# Patient Record
Sex: Male | Born: 1945 | Race: Black or African American | Hispanic: No | Smoking: Former smoker
Health system: Southern US, Community
[De-identification: ages and names within clinical notes are randomized; demographics above are authoritative.]

## PROBLEM LIST (undated history)

## (undated) DIAGNOSIS — R42 Dizziness and giddiness: Secondary | ICD-10-CM

## (undated) DIAGNOSIS — I509 Heart failure, unspecified: Secondary | ICD-10-CM

## (undated) DIAGNOSIS — K5090 Crohn's disease, unspecified, without complications: Secondary | ICD-10-CM

## (undated) DIAGNOSIS — N400 Enlarged prostate without lower urinary tract symptoms: Secondary | ICD-10-CM

## (undated) DIAGNOSIS — F32A Depression, unspecified: Secondary | ICD-10-CM

## (undated) DIAGNOSIS — H8110 Benign paroxysmal vertigo, unspecified ear: Secondary | ICD-10-CM

## (undated) DIAGNOSIS — G4700 Insomnia, unspecified: Secondary | ICD-10-CM

## (undated) DIAGNOSIS — E876 Hypokalemia: Secondary | ICD-10-CM

## (undated) DIAGNOSIS — F17200 Nicotine dependence, unspecified, uncomplicated: Secondary | ICD-10-CM

## (undated) DIAGNOSIS — F329 Major depressive disorder, single episode, unspecified: Secondary | ICD-10-CM

## (undated) DIAGNOSIS — J8410 Pulmonary fibrosis, unspecified: Secondary | ICD-10-CM

## (undated) DIAGNOSIS — L0390 Cellulitis, unspecified: Secondary | ICD-10-CM

## (undated) DIAGNOSIS — G250 Essential tremor: Secondary | ICD-10-CM

## (undated) DIAGNOSIS — E538 Deficiency of other specified B group vitamins: Secondary | ICD-10-CM

## (undated) DIAGNOSIS — N183 Chronic kidney disease, stage 3 unspecified: Secondary | ICD-10-CM

## (undated) DIAGNOSIS — G252 Other specified forms of tremor: Secondary | ICD-10-CM

## (undated) DIAGNOSIS — K900 Celiac disease: Secondary | ICD-10-CM

## (undated) DIAGNOSIS — G47419 Narcolepsy without cataplexy: Secondary | ICD-10-CM

## (undated) DIAGNOSIS — D649 Anemia, unspecified: Secondary | ICD-10-CM

## (undated) DIAGNOSIS — G2589 Other specified extrapyramidal and movement disorders: Secondary | ICD-10-CM

## (undated) DIAGNOSIS — IMO0002 Reserved for concepts with insufficient information to code with codable children: Secondary | ICD-10-CM

## (undated) DIAGNOSIS — G4730 Sleep apnea, unspecified: Secondary | ICD-10-CM

## (undated) DIAGNOSIS — L0291 Cutaneous abscess, unspecified: Secondary | ICD-10-CM

## (undated) DIAGNOSIS — F419 Anxiety disorder, unspecified: Secondary | ICD-10-CM

## (undated) DIAGNOSIS — N529 Male erectile dysfunction, unspecified: Secondary | ICD-10-CM

## (undated) DIAGNOSIS — I10 Essential (primary) hypertension: Secondary | ICD-10-CM

## (undated) DIAGNOSIS — M109 Gout, unspecified: Secondary | ICD-10-CM

## (undated) DIAGNOSIS — F319 Bipolar disorder, unspecified: Secondary | ICD-10-CM

## (undated) DIAGNOSIS — B079 Viral wart, unspecified: Secondary | ICD-10-CM

## (undated) DIAGNOSIS — R634 Abnormal weight loss: Secondary | ICD-10-CM

## (undated) DIAGNOSIS — D509 Iron deficiency anemia, unspecified: Secondary | ICD-10-CM

## (undated) DIAGNOSIS — E441 Mild protein-calorie malnutrition: Secondary | ICD-10-CM

## (undated) HISTORY — DX: Essential tremor: G25.0

## (undated) HISTORY — DX: Mild protein-calorie malnutrition: E44.1

## (undated) HISTORY — DX: Chronic kidney disease, stage 3 (moderate): N18.3

## (undated) HISTORY — DX: Insomnia, unspecified: G47.00

## (undated) HISTORY — DX: Bipolar disorder, unspecified: F31.9

## (undated) HISTORY — DX: Deficiency of other specified B group vitamins: E53.8

## (undated) HISTORY — DX: Hypokalemia: E87.6

## (undated) HISTORY — DX: Heart failure, unspecified: I50.9

## (undated) HISTORY — DX: Cellulitis, unspecified: L03.90

## (undated) HISTORY — DX: Anemia, unspecified: D64.9

## (undated) HISTORY — DX: Abnormal weight loss: R63.4

## (undated) HISTORY — DX: Iron deficiency anemia, unspecified: D50.9

## (undated) HISTORY — DX: Depression, unspecified: F32.A

## (undated) HISTORY — DX: Benign paroxysmal vertigo, unspecified ear: H81.10

## (undated) HISTORY — PX: SMALL INTESTINE SURGERY: SHX150

## (undated) HISTORY — DX: Chronic kidney disease, stage 3 unspecified: N18.30

## (undated) HISTORY — DX: Essential (primary) hypertension: I10

## (undated) HISTORY — DX: Nicotine dependence, unspecified, uncomplicated: F17.200

## (undated) HISTORY — DX: Dizziness and giddiness: R42

## (undated) HISTORY — DX: Cutaneous abscess, unspecified: L02.91

## (undated) HISTORY — DX: Reserved for concepts with insufficient information to code with codable children: IMO0002

## (undated) HISTORY — DX: Pulmonary fibrosis, unspecified: J84.10

## (undated) HISTORY — DX: Narcolepsy without cataplexy: G47.419

## (undated) HISTORY — DX: Viral wart, unspecified: B07.9

## (undated) HISTORY — DX: Anxiety disorder, unspecified: F41.9

## (undated) HISTORY — DX: Crohn's disease, unspecified, without complications: K50.90

## (undated) HISTORY — DX: Major depressive disorder, single episode, unspecified: F32.9

## (undated) HISTORY — DX: Sleep apnea, unspecified: G47.30

## (undated) HISTORY — DX: Celiac disease: K90.0

## (undated) HISTORY — DX: Other specified extrapyramidal and movement disorders: G25.89

## (undated) HISTORY — DX: Benign prostatic hyperplasia without lower urinary tract symptoms: N40.0

## (undated) HISTORY — DX: Essential tremor: G25.2

## (undated) HISTORY — DX: Male erectile dysfunction, unspecified: N52.9

---

## 1999-10-02 ENCOUNTER — Ambulatory Visit (HOSPITAL_COMMUNITY): Admission: RE | Admit: 1999-10-02 | Discharge: 1999-10-02 | Payer: Self-pay | Admitting: *Deleted

## 2001-08-21 ENCOUNTER — Emergency Department (HOSPITAL_COMMUNITY): Admission: EM | Admit: 2001-08-21 | Discharge: 2001-08-21 | Payer: Self-pay

## 2002-02-09 ENCOUNTER — Ambulatory Visit (HOSPITAL_COMMUNITY): Admission: RE | Admit: 2002-02-09 | Discharge: 2002-02-09 | Payer: Self-pay | Admitting: *Deleted

## 2002-02-09 ENCOUNTER — Encounter: Payer: Self-pay | Admitting: Gastroenterology

## 2004-04-05 ENCOUNTER — Ambulatory Visit (HOSPITAL_COMMUNITY): Admission: RE | Admit: 2004-04-05 | Discharge: 2004-04-05 | Payer: Self-pay | Admitting: *Deleted

## 2004-04-05 ENCOUNTER — Encounter: Payer: Self-pay | Admitting: Gastroenterology

## 2004-07-21 ENCOUNTER — Emergency Department (HOSPITAL_COMMUNITY): Admission: EM | Admit: 2004-07-21 | Discharge: 2004-07-22 | Payer: Self-pay | Admitting: Emergency Medicine

## 2005-02-21 ENCOUNTER — Encounter: Admission: RE | Admit: 2005-02-21 | Discharge: 2005-02-21 | Payer: Self-pay | Admitting: Internal Medicine

## 2005-04-17 ENCOUNTER — Encounter: Admission: RE | Admit: 2005-04-17 | Discharge: 2005-04-17 | Payer: Self-pay | Admitting: Internal Medicine

## 2005-10-15 ENCOUNTER — Ambulatory Visit (HOSPITAL_BASED_OUTPATIENT_CLINIC_OR_DEPARTMENT_OTHER): Admission: RE | Admit: 2005-10-15 | Discharge: 2005-10-15 | Payer: Self-pay | Admitting: Orthopedic Surgery

## 2006-08-17 ENCOUNTER — Encounter: Payer: Self-pay | Admitting: Gastroenterology

## 2006-08-31 ENCOUNTER — Ambulatory Visit (HOSPITAL_COMMUNITY): Admission: RE | Admit: 2006-08-31 | Discharge: 2006-08-31 | Payer: Self-pay | Admitting: *Deleted

## 2006-08-31 ENCOUNTER — Encounter (INDEPENDENT_AMBULATORY_CARE_PROVIDER_SITE_OTHER): Payer: Self-pay | Admitting: *Deleted

## 2006-12-28 ENCOUNTER — Encounter: Payer: Self-pay | Admitting: Gastroenterology

## 2008-05-05 ENCOUNTER — Encounter: Admission: RE | Admit: 2008-05-05 | Discharge: 2008-05-05 | Payer: Self-pay | Admitting: Internal Medicine

## 2009-11-26 ENCOUNTER — Ambulatory Visit: Payer: Self-pay | Admitting: Oncology

## 2009-11-30 ENCOUNTER — Encounter (INDEPENDENT_AMBULATORY_CARE_PROVIDER_SITE_OTHER): Payer: Self-pay | Admitting: *Deleted

## 2009-11-30 LAB — MORPHOLOGY: PLT EST: ADEQUATE

## 2009-11-30 LAB — CBC & DIFF AND RETIC
BASO%: 0.2 % (ref 0.0–2.0)
Basophils Absolute: 0 10*3/uL (ref 0.0–0.1)
EOS%: 3.6 % (ref 0.0–7.0)
Eosinophils Absolute: 0.3 10*3/uL (ref 0.0–0.5)
HCT: 32.3 % — ABNORMAL LOW (ref 38.4–49.9)
HGB: 10.3 g/dL — ABNORMAL LOW (ref 13.0–17.1)
Immature Retic Fract: 3.9 % (ref 0.00–13.40)
LYMPH%: 29.7 % (ref 14.0–49.0)
MCH: 31.4 pg (ref 27.2–33.4)
MCHC: 31.9 g/dL — ABNORMAL LOW (ref 32.0–36.0)
MCV: 98.5 fL — ABNORMAL HIGH (ref 79.3–98.0)
MONO#: 0.5 10*3/uL (ref 0.1–0.9)
MONO%: 5.4 % (ref 0.0–14.0)
NEUT#: 5.2 10*3/uL (ref 1.5–6.5)
NEUT%: 61.1 % (ref 39.0–75.0)
Platelets: 188 10*3/uL (ref 140–400)
RBC: 3.28 10*6/uL — ABNORMAL LOW (ref 4.20–5.82)
RDW: 12.5 % (ref 11.0–14.6)
Retic %: 0.83 % (ref 0.50–1.60)
Retic Ct Abs: 27.22 10*3/uL (ref 24.10–77.50)
WBC: 8.5 10*3/uL (ref 4.0–10.3)
lymph#: 2.5 10*3/uL (ref 0.9–3.3)

## 2009-11-30 LAB — COMPREHENSIVE METABOLIC PANEL
ALT: 8 U/L (ref 0–53)
AST: 15 U/L (ref 0–37)
Albumin: 3.2 g/dL — ABNORMAL LOW (ref 3.5–5.2)
Alkaline Phosphatase: 52 U/L (ref 39–117)
BUN: 10 mg/dL (ref 6–23)
CO2: 24 mEq/L (ref 19–32)
Calcium: 8.6 mg/dL (ref 8.4–10.5)
Chloride: 112 mEq/L (ref 96–112)
Creatinine, Ser: 2.07 mg/dL — ABNORMAL HIGH (ref 0.40–1.50)
Glucose, Bld: 84 mg/dL (ref 70–99)
Potassium: 3.8 mEq/L (ref 3.5–5.3)
Sodium: 142 mEq/L (ref 135–145)
Total Bilirubin: 0.8 mg/dL (ref 0.3–1.2)
Total Protein: 6.3 g/dL (ref 6.0–8.3)

## 2009-11-30 LAB — CHCC SMEAR

## 2009-11-30 LAB — LACTATE DEHYDROGENASE: LDH: 95 U/L (ref 94–250)

## 2009-12-04 LAB — IRON AND TIBC
%SAT: 22 % (ref 20–55)
Iron: 38 ug/dL — ABNORMAL LOW (ref 42–165)
TIBC: 175 ug/dL — ABNORMAL LOW (ref 215–435)
UIBC: 137 ug/dL

## 2009-12-04 LAB — FERRITIN: Ferritin: 554 ng/mL — ABNORMAL HIGH (ref 22–322)

## 2009-12-06 LAB — PROTEIN ELECTROPHORESIS, SERUM
Albumin ELP: 56.7 % (ref 55.8–66.1)
Alpha-1-Globulin: 5.4 % — ABNORMAL HIGH (ref 2.9–4.9)
Alpha-2-Globulin: 8.9 % (ref 7.1–11.8)
Beta 2: 5.7 % (ref 3.2–6.5)
Beta Globulin: 5.1 % (ref 4.7–7.2)
Gamma Globulin: 18.2 % (ref 11.1–18.8)
Total Protein, Serum Electrophoresis: 6.3 g/dL (ref 6.0–8.3)

## 2009-12-06 LAB — KAPPA/LAMBDA LIGHT CHAINS
Kappa free light chain: 2.32 mg/dL — ABNORMAL HIGH (ref 0.33–1.94)
Kappa:Lambda Ratio: 1.25 (ref 0.26–1.65)
Lambda Free Lght Chn: 1.86 mg/dL (ref 0.57–2.63)

## 2009-12-06 LAB — HEMOGLOBINOPATHY EVALUATION
Hemoglobin Other: 0 % (ref 0.0–0.0)
Hgb A2 Quant: 2.2 % (ref 2.2–3.2)
Hgb A: 97.8 % (ref 96.8–97.8)
Hgb F Quant: 0 % (ref 0.0–2.0)
Hgb S Quant: 0 % (ref 0.0–0.0)

## 2009-12-06 LAB — ERYTHROPOIETIN: Erythropoietin: 6.5 m[IU]/mL (ref 2.6–34.0)

## 2009-12-27 ENCOUNTER — Ambulatory Visit: Payer: Self-pay | Admitting: Oncology

## 2009-12-31 LAB — CBC WITH DIFFERENTIAL/PLATELET
BASO%: 0.4 % (ref 0.0–2.0)
Eosinophils Absolute: 0.5 10*3/uL (ref 0.0–0.5)
LYMPH%: 30.5 % (ref 14.0–49.0)
MONO#: 0.6 10*3/uL (ref 0.1–0.9)
NEUT#: 5.3 10*3/uL (ref 1.5–6.5)
Platelets: 176 10*3/uL (ref 140–400)
RBC: 2.81 10*6/uL — ABNORMAL LOW (ref 4.20–5.82)
WBC: 9.1 10*3/uL (ref 4.0–10.3)
lymph#: 2.8 10*3/uL (ref 0.9–3.3)

## 2010-01-09 ENCOUNTER — Telehealth (INDEPENDENT_AMBULATORY_CARE_PROVIDER_SITE_OTHER): Payer: Self-pay | Admitting: *Deleted

## 2010-01-10 ENCOUNTER — Emergency Department (HOSPITAL_COMMUNITY)
Admission: EM | Admit: 2010-01-10 | Discharge: 2010-01-11 | Payer: Self-pay | Source: Home / Self Care | Admitting: Interventional Radiology

## 2010-01-14 ENCOUNTER — Encounter (HOSPITAL_COMMUNITY)
Admission: RE | Admit: 2010-01-14 | Discharge: 2010-03-26 | Payer: Self-pay | Source: Home / Self Care | Attending: Nephrology | Admitting: Nephrology

## 2010-01-23 ENCOUNTER — Encounter (INDEPENDENT_AMBULATORY_CARE_PROVIDER_SITE_OTHER): Payer: Self-pay | Admitting: *Deleted

## 2010-01-23 ENCOUNTER — Ambulatory Visit: Payer: Self-pay | Admitting: Gastroenterology

## 2010-01-23 DIAGNOSIS — K5090 Crohn's disease, unspecified, without complications: Secondary | ICD-10-CM

## 2010-01-23 LAB — CONVERTED CEMR LAB
ALT: 26 units/L (ref 0–53)
AST: 25 units/L (ref 0–37)
Albumin: 3.4 g/dL — ABNORMAL LOW (ref 3.5–5.2)
Alkaline Phosphatase: 66 units/L (ref 39–117)
BUN: 15 mg/dL (ref 6–23)
Basophils Absolute: 0 10*3/uL (ref 0.0–0.1)
Basophils Relative: 0.6 % (ref 0.0–3.0)
CO2: 26 meq/L (ref 19–32)
Calcium: 8.9 mg/dL (ref 8.4–10.5)
Chloride: 111 meq/L (ref 96–112)
Creatinine, Ser: 2.1 mg/dL — ABNORMAL HIGH (ref 0.4–1.5)
Eosinophils Absolute: 0.3 10*3/uL (ref 0.0–0.7)
Eosinophils Relative: 4.9 % (ref 0.0–5.0)
GFR calc non Af Amer: 40.11 mL/min (ref >60)
Glucose, Bld: 77 mg/dL (ref 70–99)
HCT: 32 % — ABNORMAL LOW (ref 39.0–52.0)
Hemoglobin: 10.9 g/dL — ABNORMAL LOW (ref 13.0–17.0)
Lymphocytes Relative: 26.5 % (ref 12.0–46.0)
Lymphs Abs: 1.7 10*3/uL (ref 0.7–4.0)
MCHC: 34.1 g/dL (ref 30.0–36.0)
MCV: 100.2 fL — ABNORMAL HIGH (ref 78.0–100.0)
Monocytes Absolute: 0.5 10*3/uL (ref 0.1–1.0)
Monocytes Relative: 7.7 % (ref 3.0–12.0)
Neutro Abs: 3.8 10*3/uL (ref 1.4–7.7)
Neutrophils Relative %: 60.3 % (ref 43.0–77.0)
Platelets: 222 10*3/uL (ref 150.0–400.0)
Potassium: 4.1 meq/L (ref 3.5–5.1)
RBC: 3.2 M/uL — ABNORMAL LOW (ref 4.22–5.81)
RDW: 14.1 % (ref 11.5–14.6)
Sed Rate: 23 mm/hr — ABNORMAL HIGH (ref 0–22)
Sodium: 143 meq/L (ref 135–145)
Total Bilirubin: 0.2 mg/dL — ABNORMAL LOW (ref 0.3–1.2)
Total Protein: 6.4 g/dL (ref 6.0–8.3)
WBC: 6.4 10*3/uL (ref 4.5–10.5)

## 2010-01-28 ENCOUNTER — Ambulatory Visit: Payer: Self-pay | Admitting: Oncology

## 2010-01-28 ENCOUNTER — Ambulatory Visit: Payer: Self-pay | Admitting: Gastroenterology

## 2010-01-28 LAB — CBC WITH DIFFERENTIAL/PLATELET
BASO%: 0.7 % (ref 0.0–2.0)
Eosinophils Absolute: 0.3 10*3/uL (ref 0.0–0.5)
MCHC: 33.1 g/dL (ref 32.0–36.0)
MCV: 100.7 fL — ABNORMAL HIGH (ref 79.3–98.0)
MONO#: 0.6 10*3/uL (ref 0.1–0.9)
MONO%: 7.5 % (ref 0.0–14.0)
NEUT#: 5 10*3/uL (ref 1.5–6.5)
RBC: 3.32 10*6/uL — ABNORMAL LOW (ref 4.20–5.82)
RDW: 14.2 % (ref 11.0–14.6)
WBC: 7.7 10*3/uL (ref 4.0–10.3)

## 2010-01-28 LAB — HM COLONOSCOPY: HM COLON: NEGATIVE

## 2010-01-30 ENCOUNTER — Encounter: Payer: Self-pay | Admitting: Gastroenterology

## 2010-01-30 ENCOUNTER — Ambulatory Visit: Payer: Self-pay | Admitting: Gastroenterology

## 2010-02-15 ENCOUNTER — Ambulatory Visit: Payer: Self-pay | Admitting: Gastroenterology

## 2010-02-15 ENCOUNTER — Encounter
Admission: RE | Admit: 2010-02-15 | Discharge: 2010-02-15 | Payer: Self-pay | Source: Home / Self Care | Attending: Internal Medicine | Admitting: Internal Medicine

## 2010-02-28 ENCOUNTER — Ambulatory Visit (HOSPITAL_BASED_OUTPATIENT_CLINIC_OR_DEPARTMENT_OTHER): Payer: Medicare Other | Admitting: Oncology

## 2010-03-04 LAB — CBC WITH DIFFERENTIAL/PLATELET
BASO%: 0.3 % (ref 0.0–2.0)
Basophils Absolute: 0 10*3/uL (ref 0.0–0.1)
EOS%: 2.3 % (ref 0.0–7.0)
Eosinophils Absolute: 0.2 10*3/uL (ref 0.0–0.5)
HCT: 36.7 % — ABNORMAL LOW (ref 38.4–49.9)
HGB: 12.1 g/dL — ABNORMAL LOW (ref 13.0–17.1)
LYMPH%: 26.1 % (ref 14.0–49.0)
MCH: 33.6 pg — ABNORMAL HIGH (ref 27.2–33.4)
MCHC: 33.1 g/dL (ref 32.0–36.0)
MCV: 101.4 fL — ABNORMAL HIGH (ref 79.3–98.0)
MONO#: 0.8 10*3/uL (ref 0.1–0.9)
MONO%: 9.7 % (ref 0.0–14.0)
NEUT#: 5.2 10*3/uL (ref 1.5–6.5)
NEUT%: 61.6 % (ref 39.0–75.0)
Platelets: 245 10*3/uL (ref 140–400)
RBC: 3.61 10*6/uL — ABNORMAL LOW (ref 4.20–5.82)
RDW: 14.3 % (ref 11.0–14.6)
WBC: 8.5 10*3/uL (ref 4.0–10.3)
lymph#: 2.2 10*3/uL (ref 0.9–3.3)

## 2010-03-11 LAB — POCT HEMOGLOBIN-HEMACUE: Hemoglobin: 12.1 g/dL — ABNORMAL LOW (ref 13.0–17.0)

## 2010-03-14 ENCOUNTER — Encounter
Admission: RE | Admit: 2010-03-14 | Discharge: 2010-03-14 | Payer: Self-pay | Source: Home / Self Care | Attending: Internal Medicine | Admitting: Internal Medicine

## 2010-03-18 LAB — POCT HEMOGLOBIN-HEMACUE: Hemoglobin: 11.8 g/dL — ABNORMAL LOW (ref 13.0–17.0)

## 2010-03-22 LAB — POCT HEMOGLOBIN-HEMACUE: Hemoglobin: 11.5 g/dL — ABNORMAL LOW (ref 13.0–17.0)

## 2010-03-26 NOTE — Progress Notes (Signed)
Summary: appt reschedule  Phone Note Other Incoming   Summary of Call: pt called to say he wanted to reschedule his appt he is feeling better and wants to wait until he gets his records from Dr Lajoyce Corners.  It is going to take 2 weeks to get them from Dr Shan Levans storage.  He said the appt was just to establish care and he would call if problems develop.  Appt was reschdueld Initial call taken by: Christian Mate CMA Deborra Medina),  January 09, 2010 1:07 PM

## 2010-03-26 NOTE — Miscellaneous (Signed)
Summary: pred rx  Clinical Lists Changes  Medications: Added new medication of PREDNISONE 10 MG  TABS (PREDNISONE) take one pill twice a day - Signed Rx of PREDNISONE 10 MG  TABS (PREDNISONE) take one pill twice a day;  #60 x 2;  Signed;  Entered by: Milus Banister MD;  Authorized by: Milus Banister MD;  Method used: Electronically to [facility] [phone]*, [place], [place], [place]  [address], Ph: [phone] or [phone], Fax: [phone]    Prescriptions: PREDNISONE 10 MG  TABS (PREDNISONE) take one pill twice a day  #60 x 2   Entered and Authorized by:   Milus Banister MD   Signed by:   Milus Banister MD on 01/28/2010   Method used:   Electronically to        [facility] [phone]* (retail)       [address] [place], [place]  [address]       Ph: [phone] or [phone]       Fax: [phone]   RxID:   [id number]

## 2010-03-26 NOTE — Letter (Signed)
Summary: [facility] Instructions  [facility]  [place], [place] [address]   Phone: [phone]  Fax: [phone]       DANTE ROUDEBUSH    1945-09-26    MRN: [id number]        Procedure Day /Date:01/28/10 MON     Arrival Time:3 PM     Procedure Time:4 PM     Location of Procedure:                    x  [place] (4th Floor)                        [place]   Starting 5 days prior to your procedure11/30/11 do not eat nuts, seeds, popcorn, corn, beans, peas,  salads, or any raw vegetables.  Do not take any fiber supplements (e.g. Metamucil, Citrucel, and Benefiber).  THE DAY BEFORE YOUR PROCEDURE         DATE: 01/27/10  DAY: SUN  1.  Drink clear liquids the entire day-NO SOLID FOOD  2.  Do not drink anything colored red or purple.  Avoid juices with pulp.  No orange juice.  3.  Drink at least 64 oz. (8 glasses) of fluid/clear liquids during the day to prevent dehydration and help the prep work efficiently.  CLEAR LIQUIDS INCLUDE: Water Jello Ice Popsicles Tea (sugar ok, no milk/cream) Powdered fruit flavored drinks Coffee (sugar ok, no milk/cream) Gatorade Juice: apple, white grape, white cranberry  Lemonade Clear bullion, consomm, broth Carbonated beverages (any kind) Strained chicken noodle soup Hard Candy                             4.  In the morning, mix first dose of MoviPrep solution:    Empty 1 Pouch A and 1 Pouch B into the disposable container    Add lukewarm drinking water to the top line of the container. Mix to dissolve    Refrigerate (mixed solution should be used within 24 hrs)  5.  Begin drinking the prep at 5:00 p.m. The MoviPrep container is divided by 4 marks.   Every 15 minutes drink the solution down to the next mark (approximately 8 oz) until the full liter is complete.   6.  Follow completed prep with 16 oz of clear liquid of your choice (Nothing red or purple).   Continue to drink clear liquids until bedtime.  7.  Before going to bed, mix second dose of MoviPrep solution:    Empty 1 Pouch A and 1 Pouch B into the disposable container    Add lukewarm drinking water to the top line of the container. Mix to dissolve    Refrigerate  THE DAY OF YOUR PROCEDURE      DATE:01/28/10 DAY: MON  Beginning at 11 a.m. (5 hours before procedure):         1. Every 15 minutes, drink the solution down to the next mark (approx 8 oz) until the full liter is complete.  2. Follow completed prep with 16 oz. of clear liquid of your choice.    3. You may drink clear liquids until 2 PM (2 HOURS BEFORE PROCEDURE).   MEDICATION INSTRUCTIONS  Unless otherwise instructed, you should take regular prescription medications with a small sip of water   as early as possible the morning of your procedure.  OTHER INSTRUCTIONS  You will need a responsible adult at least 65 years of age to accompany you and drive you home.   This person must remain in the waiting room during your procedure.  Wear loose fitting clothing that is easily removed.  Leave jewelry and other valuables at home.  However, you may wish to bring a book to read or  an iPod/MP3 player to listen to music as you wait for your procedure to start.  Remove all body piercing jewelry and leave at home.  Total time from sign-in until discharge is approximately 2-3 hours.  You should go home directly after your procedure and rest.  You can resume normal activities the  day after your procedure.  The day of your procedure you should not:   Drive   Make legal decisions   Operate machinery   Drink alcohol   Return to work  You will receive specific instructions about eating, activities and medications before you leave.    The above instructions have been reviewed and explained to me by   _______________________    I fully understand and can verbalize these instructions  _____________________________ Date _________  Appended Document: Colonoscopy pt ROV scheduled labs in [facility] pt to come to Dr Ardis Hughs office to sign waiver before going to the lab

## 2010-03-26 NOTE — Assessment & Plan Note (Signed)
History of Present Illness Visit Type: Initial Consult Primary GI MD: Owens Loffler MD Primary Provider: Dr. Tona Sensing Requesting Provider: Sherryl Manges, MD Chief Complaint: hx crohns disease, hx with Dr. Lajoyce Corners History of Present Illness:     very pleasant 66 year old man. he was first diagnosed iwth crohn's in 1970.    He had abd surgeries (one in 70's, one in 80's).  He believes only ileum was removed.  He has been on mesalamine for many, many years and never had flare on the med.  Takes pentasa 12 pills a day.  Last Dr. Lajoyce Corners visit quite a while ago. Last colonoscopy in mid 2008: this showed a wide-open IC valve, biopsies of ileum and colon showed chronic, active colitis however macroscopically he described it as a fairly normal-appearing.  Had a dirrheal illness about 2 weeks ago.  5-6 times  a day, waking up at night as well to have diarhea.  Lasted about a week. He changed to a soft diet, also started immodium and pepto.  Stools improved.  No bleeding, no abd pains.  Overall stable weight in past year.           Current Medications (verified): 1)  Amlactin 12 % Lotn (Ammonium Lactate) .... Apply Small Amount Two Times A Day 2)  Calcium Carbonate-Vitamin D 600-400 Mg-Unit Tabs (Calcium Carbonate-Vitamin D) .... Take One By Mouth Two Times A Day 3)  Cyanocobalamin 1000 Mcg/ml Soln (Cyanocobalamin) .... Inject 86m Im Once A Month 4)  Divalproex Sodium 500 Mg Tbec (Divalproex Sodium) .... Take Three By Mouth At Bedtime 5)  Colace 50 Mg Caps (Docusate Sodium) .... Take One By Mouth Once Daily 6)  Ferrous Sulfate 325 (65 Fe) Mg Tabs (Ferrous Sulfate) .... Take One By Mouth Two Times A Day 7)  Flunisolide 0.025 % Soln (Flunisolide) .... Use One Spray in Each Nostril Two Times A Day 8)  Loperamide Hcl 2 Mg Caps (Loperamide Hcl) .... Take One By Mouth Once Daily As Needed 9)  Lutein 20 Mg Caps (Lutein) .... Take One By Mouth Once Daily 10)  Pentasa 250 Mg Cr-Caps (Mesalamine) .... Take  Four Tabs By Mouth Three Times A Day 11)  Multivitamins  Caps (Multiple Vitamin) .... Take One By Mouth Once Daily 12)  Zyprexa 10 Mg Tabs (Olanzapine) .... Take One By Mouth At Bedtime 13)  Zyprexa 5 Mg Tabs (Olanzapine) .... Take One By Mouth Once Daily As Needed 14)  Omeprazole 20 Mg Cpdr (Omeprazole) .... Take One By Mouth Once Daily 15)  Oxybutynin Chloride 15 Mg Xr24h-Tab (Oxybutynin Chloride) .... Take Two By Mouth Once Daily 16)  Prednisone 50 Mg Tabs (Prednisone) .... Take One By Mouth 13 Hours Prior, 7 Hours Prior, and One Hour Prior To Ct Scan. 17)  Vitamin E 400 Unit Caps (Vitamin E) .... Take One By Mouth Once Daily 18)  Procrit 10000 Unit/ml Soln (Epoetin Alfa) .... One Ml Injection Once A Week.  Allergies (verified): 1)  ! * Iv Contrast 2)  ! * Imuran  Past History:  Past Medical History: Crohn's disease, originally diagnosed in the 173s anxiety, depression,   Past Surgical History: small bowel surgery x2  Family History: no colon cancer  Social History: he is divorced, he has one son, he is retired, he currently smokes cigarettes  Review of Systems       Pertinent positive and negative review of systems were noted in the above HPI and GI specific review of systems.  All other review of  systems was otherwise negative.   Vital Signs:  Patient profile:   65 year old male Height:      64 inches Weight:      170.8 pounds BMI:     29.42 Pulse rate:   72 / minute Pulse rhythm:   regular BP sitting:   140 / 78  (left arm) Cuff size:   regular  Vitals Entered By: Bernita Buffy CMA Deborra Medina) (January 23, 2010 10:13 AM)  Physical Exam  Additional Exam:  Constitutional: generally well appearing Psychiatric: alert and oriented times 3 Eyes: extraocular movements intact Mouth: oropharynx moist, no lesions Neck: supple, no lymphadenopathy Cardiovascular: heart regular rate and rythm Lungs: CTA bilaterally Abdomen: soft, non-tender, non-distended, no obvious  ascites, no peritoneal signs, normal bowel sounds: two midline, vertical incisions Extremities: no lower extremity edema bilaterally Skin: no lesions on visible extremities    Impression & Recommendations:  Problem # 1:  Crohn's disease, recent diarrheal illness first, smoking is terrible for Crohn's disease and I advised him to try to quit. Second I think we need to establish how active his Crohn's disease is currently. He is due for high risk surveillance colonoscopy anyway around now and we will schedule colonoscopy at his soonest convenience.  We will get records from Dr. Shan Levans office sent over and he will get a set of labs including CBC, complete metabolic profile, sedimentation rate.  Other Orders: TLB-CBC Platelet - w/Differential (85025-CBCD) TLB-CMP (Comprehensive Metabolic Pnl) (77412-INOM) TLB-Sedimentation Rate (ESR) (85652-ESR)  Patient Instructions: 1)  Needs last 2-3 office visits from Dr. Lajoyce Corners. 2)  You will get lab test(s) done today (cbc, cmet, esr). 3)  You will be scheduled to have a colonoscopy. 4)  Smoking is terrible for Crohn's disease.  You should try your absolute best to stop.  If you need assistence, please contact your PCP or Smoking Cessation Class at [facility] [phone]) or [place] (1-800-QUIT-NOW). 5)  The medication list was reviewed and reconciled.  All changed / newly prescribed medications were explained.  A complete medication list was provided to the patient / caregiver.  Appended Document: Orders Update/MOVI    Clinical Lists Changes  Medications: Added new medication of MOVIPREP 100 GM  SOLR (PEG-KCL-NACL-NASULF-NA ASC-C) As per prep instructions. - Signed Rx of MOVIPREP 100 GM  SOLR (PEG-KCL-NACL-NASULF-NA ASC-C) As per prep instructions.;  #1 x 0;  Signed;  Entered by: Christian Mate CMA (AAMA);  Authorized by: Milus Banister MD;  Method used: Print then Give to Patient Orders: Added new Test order of Colonoscopy (Colon) -  Signed    Prescriptions: MOVIPREP 100 GM  SOLR (PEG-KCL-NACL-NASULF-NA ASC-C) As per prep instructions.  #1 x 0   Entered by:   Christian Mate CMA ([place])   Authorized by:   Milus Banister MD   Signed by:   Christian Mate CMA ([place]) on 01/23/2010   Method used:   Print then Give to Patient   RxID:   [id number]

## 2010-03-26 NOTE — Letter (Signed)
Summary: New Patient letter  [facility]  [address] [place], [place] [address]   Phone: [phone]  Fax: [phone]       11/30/2009 MRN: [id number]  Brent [name] [address], [place]  [address]  Dear Mr. Montellano,  Welcome to the Gastroenterology Division at [facility].    You are scheduled to see Dr.  Ardis Hughs on 01-11-10 at 3:00p.m. on the 3rd floor at [facility] [place] [employer].  We ask that you try to arrive at our office 15 minutes prior to your appointment time to allow for check-in.  We would like you to complete the enclosed self-administered evaluation form prior to your visit and bring it with you on the day of your appointment.  We will review it with you.  Also, please bring a complete list of all your medications or, if you prefer, bring the medication bottles and we will list them.  Please bring your insurance card so that we may make a copy of it.  If your insurance requires a referral to see a specialist, please bring your referral form from your primary care physician.  Co-payments are due at the time of your visit and may be paid by cash, check or credit card.     Your office visit will consist of a consult with your physician (includes a physical exam), any laboratory testing he/she may order, scheduling of any necessary diagnostic testing (e.g. x-ray, ultrasound, CT-scan), and scheduling of a procedure (e.g. Endoscopy, Colonoscopy) if required.  Please allow enough time on your schedule to allow for any/all of these possibilities.    If you cannot keep your appointment, please call [phone] to cancel or reschedule prior to your appointment date.  This allows [name] the opportunity to schedule an appointment for another patient in need of care.  If you do not cancel or reschedule by 5 p.m. the business day prior to your appointment date, you will be charged a $50.00 late cancellation/no-show fee.    Thank you for choosing  [facility] for your medical needs.  We appreciate the opportunity to care for you.  Please visit [name] at our website  to learn more about our practice.                     Sincerely,                                                             The Gastroenterology Division

## 2010-03-26 NOTE — Procedures (Signed)
Summary: Colon   Colonoscopy  Procedure date:  08/31/2006  Findings:      Location:  [facility]   NAME:  [name], Brent                  ACCOUNT NO.:  [id number]      MEDICAL RECORD NO.:  [id number]          PATIENT TYPE:  AMB      LOCATION:  ENDO                         FACILITY:  [facility]      PHYSICIAN:  Waverly Ferrari, M.D.    DATE OF BIRTH:  11/27/1945      DATE OF PROCEDURE:  08/31/2006   DATE OF DISCHARGE:                                  OPERATIVE REPORT      PROCEDURE:  Colonoscopy.      INDICATIONS:  Colon cancer screening, Crohn's disease.      ANESTHESIA:   1. Fentanyl 100 mcg.   2. Versed 6 mg.      PROCEDURE:  With the patient mildly sedated in the left lateral   decubitus position, rectal examination was performed which was   unremarkable.  Prostate was not enlarged. Subsequently, the Pentax   videoscopic colonoscope was inserted into the rectum and passed under   direct vision to the cecum, identified by ileocecal valve and   appendiceal orifice, both of which were photographed.  The ileocecal   valve was scarred and widely open, and there was ulceration around the   opening of the ileocecal valve and somewhat proximally into the terminal   ileum, which we photographed.  We did separate biopsies of the terminal   ileum ulcers and withdrew the colonoscope, taking circumferential views   of the small bowel mucosa until the endoscope been pulled back into the   cecum, at which point we biopsied the ulcers around the ileocecal valve   that were on the colonic side in a separate container.  From this point,   the colonoscope was slowly withdrawn taking circumferential views of the   colonic mucosa of which remained looking and normal.  From that point   distally, we took random biopsies in four quadrants every 10 cm, until   we reached the rectum which appeared normal on direct and showed   hemorrhoids on retroflexed view.  The endoscope was straightened and    withdrawn.  The patient's vital signs and pulse oximeter remained   stable.  The patient tolerated the procedure well, without apparent   complications.      FINDINGS:  Probable Crohn's disease of terminal ileum and involving the   cecum around the ileocecal valve, with scarred widely open ileocecal   valve noted.  Otherwise, an unremarkable examination other than internal   hemorrhoids.      PLAN:  Await biopsy reports.  The patient will call me for results and   follow up with me as an outpatient.                  ______________________________   Waverly Ferrari, M.D.            GMO/MEDQ  D:  08/31/2006  T:  08/31/2006  Job:  [id number]  cc:   Viviann Spare. Nyoka Cowden, M.D.   Fax: [phone]

## 2010-03-28 ENCOUNTER — Encounter: Payer: Self-pay | Admitting: Gastroenterology

## 2010-03-28 NOTE — Procedures (Signed)
Summary: Colonoscopy/[facility]  Colonoscopy/[facility]   Imported By: Phillis Knack 02/07/2010 12:53:46  _____________________________________________________________________  External Attachment:    Type:   Image     Comment:   External Document

## 2010-03-28 NOTE — Assessment & Plan Note (Signed)
Summary: 2-3 week f/u /pl   Review of gastrointestinal problems: 1. crohn's diagnosed in 1970.    He had abd surgeries (one in 70's, one in 80's).  He believes only ileum was removed.Colonoscopy with Dr. Lajoyce Corners in mid 2008: this showed a wide-open IC valve, biopsies of ileum and colon showed chronic, active colitis however macroscopically he described it as a fairly normal-appearing.  Colonoscopy Dr. Ardis Hughs 12/11 found no colitis, but actively ulcerated and inflamed neoterminal ileum (while he was on prednisone).   History of Present Illness Visit Type: Follow-up Visit Primary GI MD: Owens Loffler MD Primary Provider: Dr. Tona Sensing Requesting Provider: n/a Chief Complaint: Follow up, No GI Complaints History of Present Illness:     [facility] by the [facility] to start remicade. he had PPD last month and it was negative.  he sees a gastroenterologist at the [place].  he explained more about his imuran reaction in the past, will never try it again.  his blood counts dropped significantly and he was require platelet transfusions, blood transfusions.  he will have loose stools in the morning 2 or 3 of them.             Current Medications (verified): 1)  Amlactin 12 % Lotn (Ammonium Lactate) .... Apply Small Amount Two Times A Day 2)  Calcium Carbonate-Vitamin D 600-400 Mg-Unit Tabs (Calcium Carbonate-Vitamin D) .... Take One By Mouth Two Times A Day 3)  Cyanocobalamin 1000 Mcg/ml Soln (Cyanocobalamin) .... Inject 69m Im Once A Month 4)  Divalproex Sodium 500 Mg Tbec (Divalproex Sodium) .... Take Three By Mouth At Bedtime 5)  Colace 50 Mg Caps (Docusate Sodium) .... Take One By Mouth Once Daily 6)  Ferrous Sulfate 325 (65 Fe) Mg Tabs (Ferrous Sulfate) .... Take One By Mouth Two Times A Day 7)  Flunisolide 0.025 % Soln (Flunisolide) .... Use One Spray in Each Nostril Two Times A Day 8)  Loperamide Hcl 2 Mg Caps (Loperamide Hcl) .... Take One By Mouth  Once Daily As Needed 9)  Lutein 20 Mg Caps (Lutein) .... Take One By Mouth Once Daily 10)  Pentasa 250 Mg Cr-Caps (Mesalamine) .... Take Four Tabs By Mouth Three Times A Day 11)  Multivitamins  Caps (Multiple Vitamin) .... Take One By Mouth Once Daily 12)  Zyprexa 10 Mg Tabs (Olanzapine) .... Take One By Mouth At Bedtime 13)  Zyprexa 5 Mg Tabs (Olanzapine) .... Take One By Mouth Once Daily As Needed 14)  Omeprazole 20 Mg Cpdr (Omeprazole) .... Take One By Mouth Once Daily 15)  Oxybutynin Chloride 15 Mg Xr24h-Tab (Oxybutynin Chloride) .... Take Two By Mouth Once Daily 16)  Vitamin E 400 Unit Caps (Vitamin E) .... Take One By Mouth Once Daily 17)  Procrit 10000 Unit/ml Soln (Epoetin Alfa) .... One Ml Injection Once A Week. 18)  Amlodipine Besylate 10 Mg Tabs (Amlodipine Besylate) ..Marland Kitchen. 1 By Mouth At Bedtime 19)  Magnesium Oxide 400 Mg Tabs (Magnesium Oxide) ..Marland Kitchen. 1 By Mouth Once Daily 20)  Metoprolol Succinate 25 Mg Xr24h-Tab (Metoprolol Succinate) ..Marland Kitchen. 1 By Mouth Once Daily  Allergies (verified): 1)  ! * Iv Contrast 2)  ! * Imuran  Vital Signs:  Patient profile:   65year old male Height:      64 inches Weight:      169 pounds BMI:     29.11 BSA:     1.82 Pulse rate:   100 / minute  Pulse rhythm:   regular BP sitting:   126 / 80  (left arm)  Vitals Entered By: Elliott Deborra Medina) (March 15, 2010 3:33 PM)  Physical Exam  Additional Exam:  Constitutional: generally well appearing Psychiatric: alert and oriented times 3 Abdomen: soft, non-tender, non-distended, normal bowel sounds    Impression & Recommendations:  Problem # 1:  active Crohn's ileitis first I explained to him that he should choose one set of gastroenterology doctors. He was planning to have Remicade through the [place], starting last week or 2 but he is upset that they have not been able to get started for him and he wants to see me exclude similar now. I am happy to care for him but he di let him know is  probably best he only has one gastroenterologist. He is prednisone dependent. He had severe hematologic reaction to Imuran and years ago and we'll never tried again. Biologic start the next step I'll start him on Remicade with the usual loading dose and then every 8 weeks 5 mg per kilogram dosing.  he had PPD tested last month the and tells me it was negative. She'll return to see me in 3-4 weeks after starting Remicade.  Patient Instructions: 1)  Will start remicade infusions at 57m/kg (0, 2, 6 weeks) then every 8 weeks after that. 2)  Return to see Dr. JArdis Hughs3-4 weeks after you start remicade. 3)  The medication list was reviewed and reconciled.  All changed / newly prescribed medications were explained.  A complete medication list was provided to the patient / caregiver.  Appended Document: Orders Update Pt given appt dates for first 3 infusions.   Clinical Lists Changes  Orders: Added new Test order of Remicade Infusion (Remicade) - Signed

## 2010-03-28 NOTE — Procedures (Signed)
Summary: Colonoscopy  Patient: Brent [name] Note: All result statuses are Final unless otherwise noted.  Tests: (1) Colonoscopy (COL)   COL Colonoscopy           [facility].     [place], [place]  [address]           COLONOSCOPY PROCEDURE REPORT           PATIENT:  Brent, [name]  MR#:  [id number]     BIRTHDATE:  Sep 13, 1945, 64 yrs. old  GENDER:  male     ENDOSCOPIST:  Milus Banister, MD     PROCEDURE DATE:  01/28/2010     PROCEDURE:  Colonoscopy with biopsy     ASA CLASS:  Class II     INDICATIONS:  long history of Crohn's ileocolitis; last     colonoscopy with Dr. Lajoyce Corners was 3 years ago; no dysplasia on colon     biopsies     MEDICATIONS:   Fentanyl 50 mcg IV, Versed 5 mg IV           DESCRIPTION OF PROCEDURE:   After the risks benefits and     alternatives of the procedure were thoroughly explained, informed     consent was obtained.  Digital rectal exam was performed and     revealed no rectal masses.   The LB CF-[id number] endoscope     was introduced through the anus and advanced to the terminal ileum     which was intubated for a short distance, without limitations.     The quality of the prep was good, using MoviPrep.  The instrument     was then slowly withdrawn as the colon was fully examined.     <<PROCEDUREIMAGES>>           FINDINGS:  There was active, ulcerative ileitis in neo-terminal     ileum. The ic anastomosis was patent. Biopsies were taken from     terminal ileum. Ramainder of colon was normal appearing (no overt     colitis) and was biopsied in 4 quadrants every 8-10 cm to check     for dysplasia (see image3, image4, image6, image7, and     image8).There was a 56m opening in proximal colon (approximately     5cm distal to the IC anastomosis) (see image2). The opening was     somewhat inflammed (fistula vs diverticulum?)   Retroflexed views     in the rectum revealed no abnormalities.    The scope was then     withdrawn  from the patient and the procedure completed.           COMPLICATIONS:  None     ENDOSCOPIC IMPRESSION:     1) IC anastomosis     2) Active, ulcerative inflammation in ileum (biopsied)     3) No overt colitis, however colon was biopsied to check for     dysplasia           RECOMMENDATIONS:     Do your best to stop smoking, it is terrible for Crohn's     disease.     Dr. JArdis Hughs office will arrange for [facility] lab testing (TPMT     phenotype) and also return visit in 2-3 weeks to consider starting     other meds for your Crohn's.           ______________________________     DMelene Plan  Ardis Hughs, MD           n.     Lorrin Mais:   Milus Banister at 01/28/2010 02:41 PM           Micael Hampshire, [phone]  Note: An exclamation mark (!) indicates a result that was not dispersed into the flowsheet. Document Creation Date: 01/28/2010 2:41 PM _______________________________________________________________________  (1) Order result status: Final Collection or observation date-time: 01/28/2010 14:30 Requested date-time:  Receipt date-time:  Reported date-time:  Referring Physician:   Ordering Physician: Owens Loffler [phone]) Specimen Source:  Source: Tawanna Cooler Order Number: [id number] Lab site:   Appended Document: Colonoscopy pt ROV scheduled labs in [facility] pt to come to Dr Ardis Hughs office to sign waiver before going to the lab

## 2010-03-28 NOTE — Letter (Signed)
Summary: [facility]   Imported By: Phillis Knack 02/07/2010 12:56:00  _____________________________________________________________________  External Attachment:    Type:   Image     Comment:   External Document

## 2010-03-28 NOTE — Letter (Signed)
Summary: [facility]   Imported By: Phillis Knack 02/07/2010 12:57:10  _____________________________________________________________________  External Attachment:    Type:   Image     Comment:   External Document

## 2010-03-28 NOTE — Procedures (Signed)
Summary: Colonoscopy/[place]  Colonoscopy/[place]   Imported By: Phillis Knack 02/07/2010 12:54:58  _____________________________________________________________________  External Attachment:    Type:   Image     Comment:   External Document

## 2010-03-29 ENCOUNTER — Ambulatory Visit (HOSPITAL_BASED_OUTPATIENT_CLINIC_OR_DEPARTMENT_OTHER): Payer: Medicare Other | Admitting: Oncology

## 2010-03-29 ENCOUNTER — Encounter (HOSPITAL_COMMUNITY): Payer: MEDICARE | Attending: Nephrology

## 2010-03-29 DIAGNOSIS — N183 Chronic kidney disease, stage 3 unspecified: Secondary | ICD-10-CM | POA: Insufficient documentation

## 2010-03-29 DIAGNOSIS — D649 Anemia, unspecified: Secondary | ICD-10-CM

## 2010-03-29 DIAGNOSIS — D638 Anemia in other chronic diseases classified elsewhere: Secondary | ICD-10-CM | POA: Insufficient documentation

## 2010-03-29 DIAGNOSIS — K5090 Crohn's disease, unspecified, without complications: Secondary | ICD-10-CM

## 2010-03-29 DIAGNOSIS — N289 Disorder of kidney and ureter, unspecified: Secondary | ICD-10-CM

## 2010-03-29 LAB — COMPREHENSIVE METABOLIC PANEL
ALT: 18 U/L (ref 0–53)
AST: 17 U/L (ref 0–37)
Albumin: 3.1 g/dL — ABNORMAL LOW (ref 3.5–5.2)
Alkaline Phosphatase: 68 U/L (ref 39–117)
BUN: 13 mg/dL (ref 6–23)
CO2: 24 mEq/L (ref 19–32)
Calcium: 8.5 mg/dL (ref 8.4–10.5)
Chloride: 114 mEq/L — ABNORMAL HIGH (ref 96–112)
Creatinine, Ser: 1.96 mg/dL — ABNORMAL HIGH (ref 0.40–1.50)
Glucose, Bld: 112 mg/dL — ABNORMAL HIGH (ref 70–99)
Potassium: 3.9 mEq/L (ref 3.5–5.3)
Sodium: 145 mEq/L (ref 135–145)
Total Bilirubin: 0.5 mg/dL (ref 0.3–1.2)
Total Protein: 6.4 g/dL (ref 6.0–8.3)

## 2010-03-29 LAB — CBC WITH DIFFERENTIAL/PLATELET
BASO%: 0.3 % (ref 0.0–2.0)
Basophils Absolute: 0 10*3/uL (ref 0.0–0.1)
EOS%: 2.9 % (ref 0.0–7.0)
Eosinophils Absolute: 0.3 10*3/uL (ref 0.0–0.5)
HCT: 36.2 % — ABNORMAL LOW (ref 38.4–49.9)
HGB: 11.9 g/dL — ABNORMAL LOW (ref 13.0–17.1)
LYMPH%: 16.9 % (ref 14.0–49.0)
MCH: 33.6 pg — ABNORMAL HIGH (ref 27.2–33.4)
MCHC: 32.7 g/dL (ref 32.0–36.0)
MCV: 102.8 fL — ABNORMAL HIGH (ref 79.3–98.0)
MONO#: 0.5 10*3/uL (ref 0.1–0.9)
MONO%: 6.4 % (ref 0.0–14.0)
NEUT#: 6.3 10*3/uL (ref 1.5–6.5)
NEUT%: 73.5 % (ref 39.0–75.0)
Platelets: 199 10*3/uL (ref 140–400)
RBC: 3.53 10*6/uL — ABNORMAL LOW (ref 4.20–5.82)
RDW: 14.7 % — ABNORMAL HIGH (ref 11.0–14.6)
WBC: 8.5 10*3/uL (ref 4.0–10.3)
lymph#: 1.4 10*3/uL (ref 0.9–3.3)

## 2010-03-29 LAB — FERRITIN: Ferritin: 627 ng/mL — ABNORMAL HIGH (ref 22–322)

## 2010-03-29 LAB — IRON AND TIBC: Iron: 20 ug/dL — ABNORMAL LOW (ref 42–165)

## 2010-04-02 ENCOUNTER — Ambulatory Visit (HOSPITAL_COMMUNITY): Payer: MEDICARE | Attending: Gastroenterology

## 2010-04-02 ENCOUNTER — Encounter: Payer: Self-pay | Admitting: Gastroenterology

## 2010-04-02 DIAGNOSIS — K5289 Other specified noninfective gastroenteritis and colitis: Secondary | ICD-10-CM | POA: Insufficient documentation

## 2010-04-05 ENCOUNTER — Other Ambulatory Visit: Payer: Self-pay | Admitting: Nephrology

## 2010-04-05 ENCOUNTER — Encounter (HOSPITAL_COMMUNITY): Payer: MEDICARE

## 2010-04-05 ENCOUNTER — Other Ambulatory Visit: Payer: Self-pay

## 2010-04-05 LAB — IRON AND TIBC
Saturation Ratios: 49 % (ref 20–55)
UIBC: 107 ug/dL

## 2010-04-11 NOTE — Letter (Signed)
Summary: Remicade note/[facility]  [facility]ade note/[facility]   Imported By: Bubba Hales 04/05/2010 15:57:12  _____________________________________________________________________  External Attachment:    Type:   Image     Comment:   External Document

## 2010-04-11 NOTE — Letter (Signed)
Summary: [facility]   Imported By: Bubba Hales 04/05/2010 16:25:42  _____________________________________________________________________  External Attachment:    Type:   Image     Comment:   External Document

## 2010-04-16 ENCOUNTER — Encounter (HOSPITAL_COMMUNITY): Payer: MEDICARE | Attending: Gastroenterology

## 2010-04-16 ENCOUNTER — Encounter: Payer: Self-pay | Admitting: Gastroenterology

## 2010-04-16 DIAGNOSIS — F341 Dysthymic disorder: Secondary | ICD-10-CM | POA: Insufficient documentation

## 2010-04-16 DIAGNOSIS — Z79899 Other long term (current) drug therapy: Secondary | ICD-10-CM | POA: Insufficient documentation

## 2010-04-16 DIAGNOSIS — K5080 Crohn's disease of both small and large intestine without complications: Secondary | ICD-10-CM | POA: Insufficient documentation

## 2010-04-19 ENCOUNTER — Other Ambulatory Visit: Payer: Self-pay

## 2010-04-19 ENCOUNTER — Encounter (HOSPITAL_COMMUNITY): Payer: MEDICARE

## 2010-04-22 LAB — POCT HEMOGLOBIN-HEMACUE: Hemoglobin: 12.4 g/dL — ABNORMAL LOW (ref 13.0–17.0)

## 2010-04-29 ENCOUNTER — Encounter (HOSPITAL_BASED_OUTPATIENT_CLINIC_OR_DEPARTMENT_OTHER): Payer: Medicare Other | Admitting: Oncology

## 2010-04-29 ENCOUNTER — Ambulatory Visit: Payer: Self-pay | Admitting: Gastroenterology

## 2010-04-29 ENCOUNTER — Other Ambulatory Visit: Payer: Self-pay | Admitting: Oncology

## 2010-04-29 DIAGNOSIS — D649 Anemia, unspecified: Secondary | ICD-10-CM

## 2010-04-29 LAB — CBC WITH DIFFERENTIAL/PLATELET
Basophils Absolute: 0 10*3/uL (ref 0.0–0.1)
Eosinophils Absolute: 0.3 10*3/uL (ref 0.0–0.5)
HCT: 34.5 % — ABNORMAL LOW (ref 38.4–49.9)
HGB: 11.4 g/dL — ABNORMAL LOW (ref 13.0–17.1)
MCH: 33.1 pg (ref 27.2–33.4)
MONO#: 0.6 10*3/uL (ref 0.1–0.9)
NEUT#: 4.4 10*3/uL (ref 1.5–6.5)
NEUT%: 55.4 % (ref 39.0–75.0)
RDW: 13.7 % (ref 11.0–14.6)
WBC: 7.9 10*3/uL (ref 4.0–10.3)
lymph#: 2.6 10*3/uL (ref 0.9–3.3)

## 2010-05-01 ENCOUNTER — Encounter: Payer: Self-pay | Admitting: Gastroenterology

## 2010-05-01 ENCOUNTER — Ambulatory Visit: Payer: Self-pay | Admitting: Gastroenterology

## 2010-05-01 ENCOUNTER — Ambulatory Visit (INDEPENDENT_AMBULATORY_CARE_PROVIDER_SITE_OTHER): Payer: MEDICARE | Admitting: Gastroenterology

## 2010-05-01 DIAGNOSIS — K5090 Crohn's disease, unspecified, without complications: Secondary | ICD-10-CM

## 2010-05-02 NOTE — Op Note (Signed)
Summary: Remicade/[place]  Remicade/[place]   Imported By: Phillis Knack 04/24/2010 14:57:55  _____________________________________________________________________  External Attachment:    Type:   Image     Comment:   External Document

## 2010-05-03 ENCOUNTER — Encounter (HOSPITAL_COMMUNITY): Payer: MEDICARE

## 2010-05-06 LAB — IRON AND TIBC
Iron: 43 ug/dL (ref 42–135)
Saturation Ratios: 14 % — ABNORMAL LOW (ref 20–55)
Saturation Ratios: 20 % (ref 20–55)
TIBC: 212 ug/dL — ABNORMAL LOW (ref 215–435)
TIBC: 221 ug/dL (ref 215–435)
UIBC: 169 ug/dL
UIBC: 190 ug/dL

## 2010-05-06 LAB — POCT HEMOGLOBIN-HEMACUE: Hemoglobin: 12 g/dL — ABNORMAL LOW (ref 13.0–17.0)

## 2010-05-06 LAB — FERRITIN
Ferritin: 684 ng/mL — ABNORMAL HIGH (ref 22–322)
Ferritin: 949 ng/mL — ABNORMAL HIGH (ref 22–322)

## 2010-05-07 LAB — COMPREHENSIVE METABOLIC PANEL
Albumin: 3.2 g/dL — ABNORMAL LOW (ref 3.5–5.2)
BUN: 20 mg/dL (ref 6–23)
Chloride: 110 mEq/L (ref 96–112)
Creatinine, Ser: 2.05 mg/dL — ABNORMAL HIGH (ref 0.4–1.5)
Glucose, Bld: 96 mg/dL (ref 70–99)
Total Bilirubin: 0.3 mg/dL (ref 0.3–1.2)
Total Protein: 6.6 g/dL (ref 6.0–8.3)

## 2010-05-07 LAB — URINALYSIS, ROUTINE W REFLEX MICROSCOPIC
Bilirubin Urine: NEGATIVE
Hgb urine dipstick: NEGATIVE
Ketones, ur: NEGATIVE mg/dL
Protein, ur: NEGATIVE mg/dL
Urobilinogen, UA: 0.2 mg/dL (ref 0.0–1.0)

## 2010-05-07 LAB — POCT HEMOGLOBIN-HEMACUE: Hemoglobin: 9.6 g/dL — ABNORMAL LOW (ref 13.0–17.0)

## 2010-05-07 LAB — CBC
MCH: 32.8 pg (ref 26.0–34.0)
MCV: 99.3 fL (ref 78.0–100.0)
Platelets: 172 10*3/uL (ref 150–400)
RDW: 13.3 % (ref 11.5–15.5)

## 2010-05-07 LAB — SAMPLE TO BLOOD BANK

## 2010-05-07 LAB — VALPROIC ACID LEVEL: Valproic Acid Lvl: 34.5 ug/mL — ABNORMAL LOW (ref 50.0–100.0)

## 2010-05-07 LAB — DIFFERENTIAL
Basophils Absolute: 0 10*3/uL (ref 0.0–0.1)
Lymphocytes Relative: 29 % (ref 12–46)
Monocytes Absolute: 0.6 10*3/uL (ref 0.1–1.0)
Neutro Abs: 4.2 10*3/uL (ref 1.7–7.7)
Neutrophils Relative %: 55 % (ref 43–77)

## 2010-05-07 NOTE — Assessment & Plan Note (Signed)
Review of gastrointestinal problems: 1. crohn's diagnosed in 1970.    He had abd surgeries (one in 70's, one in 80's).  He believes only ileum was removed.Colonoscopy with Dr. Lajoyce Corners in mid 2008: this showed a wide-open IC valve, biopsies of ileum and colon showed chronic, active colitis however macroscopically he described it as a fairly normal-appearing.  Colonoscopy Dr. Ardis Hughs 12/11 found no colitis, but actively ulcerated and inflamed neoterminal ileum (while he was on prednisone).  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    January 2012 TB skin testing was negative   Remicade started February 2012 at 5 mg per kilogram.   Vital Signs:  Patient profile:   65 year old male Height:      64 inches Weight:      163.25 pounds BMI:     28.12 BSA:     1.80 Pulse rate:   100 / minute Pulse rhythm:   regular BP sitting:   140 / 90  (left arm)  Vitals Entered By: [place] Deborra Medina) (May 01, 2010 10:05 AM)  Chief Complaint:  Crohn's.  History of Present Illness:   weight down 6 pounds since last visit.  Had had two 'doses' of remicade.  He has not noticed any improvement at all since starting the remicade.  O  ff of steroids completely, last was several months ago.    He has three loose stools in AM.  None after that.    Current Medications (verified): 1)  Amlactin 12 % Lotn (Ammonium Lactate) .... Apply Small Amount Two Times A Day 2)  Calcium Carbonate-Vitamin D 600-400 Mg-Unit Tabs (Calcium Carbonate-Vitamin D) .... Take One By Mouth Two Times A Day 3)  Cyanocobalamin 1000 Mcg/ml Soln (Cyanocobalamin) .... Inject 59m Im Once A Month 4)  Divalproex Sodium 500 Mg Tbec (Divalproex Sodium) .... Take Three By Mouth At Bedtime 5)  Colace 50 Mg Caps (Docusate Sodium) .... Take One By Mouth Once Daily 6)  Ferrous Sulfate 325 (65 Fe) Mg Tabs (Ferrous Sulfate) .... Take One By Mouth Two Times A Day 7)  Flunisolide 0.025 % Soln (Flunisolide) .... Use One  Spray in Each Nostril Two Times A Day 8)  Loperamide Hcl 2 Mg Caps (Loperamide Hcl) .... Take One By Mouth Once Daily As Needed 9)  Lutein 20 Mg Caps (Lutein) .... Take One By Mouth Once Daily 10)  Pentasa 250 Mg Cr-Caps (Mesalamine) .... Take Four Tabs By Mouth Three Times A Day 11)  Multivitamins  Caps (Multiple Vitamin) .... Take One By Mouth Once Daily 12)  Zyprexa 10 Mg Tabs (Olanzapine) .... Take One By Mouth At Bedtime 13)  Zyprexa 5 Mg Tabs (Olanzapine) .... Take One By Mouth Once Daily As Needed 14)  Omeprazole 20 Mg Cpdr (Omeprazole) .... Take One By Mouth Once Daily 15)  Oxybutynin Chloride 15 Mg Xr24h-Tab (Oxybutynin Chloride) .... Take Two By Mouth Once Daily 16)  Vitamin E 400 Unit Caps (Vitamin E) .... Take One By Mouth Once Daily 17)  Procrit 10000 Unit/ml Soln (Epoetin Alfa) .... One Ml Injection Once A Week. 18)  Amlodipine Besylate 10 Mg Tabs (Amlodipine Besylate) ..Marland Kitchen. 1 By Mouth At Bedtime 19)  Magnesium Oxide 400 Mg Tabs (Magnesium Oxide) ..Marland Kitchen. 1 By Mouth Once Daily 20)  Metoprolol Succinate 25 Mg Xr24h-Tab (Metoprolol Succinate) ..Marland Kitchen. 1 By Mouth Once Daily 21)  Remicade 100 Mg Solr (Infliximab) .... 5kg Per Kg  Every 8 Weeks  Allergies (verified): 1)  ! * Iv Contrast  2)  ! * Imuran   Impression & Recommendations:  Problem # 1:  crohn's   still with loose AM stools.  Perhaps   bile salt malabsorption. He will start cholestyramine trial. He will continue Remicade as scheduled. I see no reason for mesalamine to be continued anymore. He'll return to see me in 4-5 weeks.  Patient Instructions: 1)  stop the pentasa 2)  continue with remicade schedule 3)  Trial of cholestyramine powder, one dose at bedtime. 4)  Please schedule a follow-up appointment in 4 to 6 weeks.  5)  The medication list was reviewed and reconciled.  All changed / newly prescribed medications were explained.  A complete medication list was provided to the patient /  caregiver. Prescriptions: CHOLESTYRAMINE   POWD (CHOLESTYRAMINE) take 4grams of powder, once daily  #1 month x 3   Entered and Authorized by:   Milus Banister MD   Signed by:   Milus Banister MD on 05/01/2010   Method used:   Electronically to        [facility] [phone]* (retail)       [address] [place], [place]  [address]       Ph: [phone] or [phone]       Fax: [phone]   RxID:   [id number]

## 2010-05-10 ENCOUNTER — Encounter (HOSPITAL_COMMUNITY): Payer: MEDICARE | Attending: Nephrology

## 2010-05-10 ENCOUNTER — Other Ambulatory Visit: Payer: Self-pay

## 2010-05-10 ENCOUNTER — Telehealth: Payer: Self-pay | Admitting: Gastroenterology

## 2010-05-10 ENCOUNTER — Other Ambulatory Visit: Payer: Self-pay | Admitting: Nephrology

## 2010-05-10 DIAGNOSIS — N183 Chronic kidney disease, stage 3 unspecified: Secondary | ICD-10-CM | POA: Insufficient documentation

## 2010-05-10 DIAGNOSIS — D638 Anemia in other chronic diseases classified elsewhere: Secondary | ICD-10-CM | POA: Insufficient documentation

## 2010-05-10 LAB — IRON AND TIBC: Saturation Ratios: 61 % — ABNORMAL HIGH (ref 20–55)

## 2010-05-13 LAB — POCT HEMOGLOBIN-HEMACUE: Hemoglobin: 11.6 g/dL — ABNORMAL LOW (ref 13.0–17.0)

## 2010-05-14 ENCOUNTER — Encounter (HOSPITAL_COMMUNITY): Payer: MEDICARE | Attending: Gastroenterology

## 2010-05-14 ENCOUNTER — Other Ambulatory Visit: Payer: Self-pay | Admitting: Gastroenterology

## 2010-05-14 DIAGNOSIS — Z79899 Other long term (current) drug therapy: Secondary | ICD-10-CM | POA: Insufficient documentation

## 2010-05-14 DIAGNOSIS — K5080 Crohn's disease of both small and large intestine without complications: Secondary | ICD-10-CM | POA: Insufficient documentation

## 2010-05-14 DIAGNOSIS — F341 Dysthymic disorder: Secondary | ICD-10-CM | POA: Insufficient documentation

## 2010-05-14 NOTE — Telephone Encounter (Signed)
Does not feel the cholestyramine powder is working.  He has had diarrhea every morning, fatigue, chills and no abd pain but feels he was under better control on the prednisone.  He states even it is just for a few days then he would like to try the cholestyramine powder again.  [facility] is his pharmacy. Please advise

## 2010-05-14 NOTE — Telephone Encounter (Signed)
He can restart the prednisone at 31m pills, one pill once daily.  He should still continue to take the cholestryamine and actually I want him to take one dose qhs and one in am shortly after waking up.  He should not increase the pred furhter and should stay on it until i see him in office at next rov

## 2010-05-14 NOTE — Progress Notes (Signed)
Summary: Verify Orders  Phone Note From Other Clinic   Caller: Marcie Bal @ Short Stay @ [facility] [phone] Call For: Dr. Ardis Hughs Summary of Call: Calling about orders for Remicade Infusion on 05-14-10 Initial call taken by: Webb Laws,  May 10, 2010 12:31 PM  Follow-up for Phone Call        Marcie Bal questioned the date the TB skin test was done, I reviewed the chart and the test was given 01/24/10 at the [facility] clinic. Marcie Bal is aware. Follow-up by: Christian Mate CMA Deborra Medina),  May 10, 2010 12:50 PM

## 2010-05-15 NOTE — Telephone Encounter (Signed)
Pt returned call and is aware of Dr Ardis Hughs recommendations and will keep f/u appt.  He does not need refills at this time.

## 2010-05-31 ENCOUNTER — Other Ambulatory Visit: Payer: Self-pay | Admitting: Nephrology

## 2010-05-31 ENCOUNTER — Encounter (HOSPITAL_COMMUNITY): Payer: MEDICARE | Attending: Nephrology

## 2010-05-31 DIAGNOSIS — N183 Chronic kidney disease, stage 3 unspecified: Secondary | ICD-10-CM | POA: Insufficient documentation

## 2010-05-31 DIAGNOSIS — D638 Anemia in other chronic diseases classified elsewhere: Secondary | ICD-10-CM | POA: Insufficient documentation

## 2010-06-03 ENCOUNTER — Encounter (HOSPITAL_BASED_OUTPATIENT_CLINIC_OR_DEPARTMENT_OTHER): Payer: Medicare Other | Admitting: Oncology

## 2010-06-03 ENCOUNTER — Other Ambulatory Visit: Payer: Self-pay | Admitting: Oncology

## 2010-06-03 DIAGNOSIS — D649 Anemia, unspecified: Secondary | ICD-10-CM

## 2010-06-03 LAB — CBC WITH DIFFERENTIAL/PLATELET
Basophils Absolute: 0 10*3/uL (ref 0.0–0.1)
Eosinophils Absolute: 0.2 10*3/uL (ref 0.0–0.5)
HCT: 30.4 % — ABNORMAL LOW (ref 38.4–49.9)
HGB: 10.2 g/dL — ABNORMAL LOW (ref 13.0–17.1)
MCH: 34.2 pg — ABNORMAL HIGH (ref 27.2–33.4)
MONO#: 0.8 10*3/uL (ref 0.1–0.9)
NEUT#: 8.7 10*3/uL — ABNORMAL HIGH (ref 1.5–6.5)
NEUT%: 70.6 % (ref 39.0–75.0)
RDW: 14.7 % — ABNORMAL HIGH (ref 11.0–14.6)
WBC: 12.4 10*3/uL — ABNORMAL HIGH (ref 4.0–10.3)
lymph#: 2.6 10*3/uL (ref 0.9–3.3)

## 2010-06-03 LAB — POCT HEMOGLOBIN-HEMACUE: Hemoglobin: 10.7 g/dL — ABNORMAL LOW (ref 13.0–17.0)

## 2010-06-10 ENCOUNTER — Encounter: Payer: Self-pay | Admitting: Gastroenterology

## 2010-06-10 ENCOUNTER — Ambulatory Visit (INDEPENDENT_AMBULATORY_CARE_PROVIDER_SITE_OTHER): Payer: MEDICARE | Admitting: Gastroenterology

## 2010-06-10 VITALS — BP 86/60 | HR 88 | Ht 76.0 in | Wt 168.0 lb

## 2010-06-10 DIAGNOSIS — K5090 Crohn's disease, unspecified, without complications: Secondary | ICD-10-CM

## 2010-06-10 NOTE — Patient Instructions (Addendum)
Cut back on prednisone, 1/2 pill once daily for next week and then stop. Continue on cholestyramine powder, but cut back to once a day only. Stop the colace (this will cause diarrhea). Continue on remicade every 8 weeks. Return to see Dr. Ardis Hughs in 2 months.

## 2010-06-10 NOTE — Progress Notes (Signed)
Review of gastrointestinal problems:  1. crohn's diagnosed in 1970. He had abd surgeries (one in 70's, one in 80's). He believes only ileum was removed.Colonoscopy with Dr. Lajoyce Corners in mid 2008: this showed a wide-open IC valve, biopsies of ileum and colon showed chronic, active colitis however macroscopically he described it as a fairly normal-appearing. Colonoscopy Dr. Ardis Hughs 12/11 found no colitis, but actively ulcerated and inflamed neoterminal ileum (while he was on prednisone). 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   January 2012 TB skin testing was negative   Remicade started February 2012 at 5 mg per kilogram  Cholestyramine started March, 2012   HPI: This is a very pleasant 64 year old man whom I last saw about one month ago. Since then he has been on cholestyramine twice daily. He has also been taking Colace once daily. He continues on his Remicade and has received 3 full doses so far. He tells me his bowels and then very good lately, but diarrhea is completely resolved. 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 He will stay on Remicade at 5 mg per kilogram every 8 weeks. He'll stay on cholestyramine but he will decrease his dose to once daily. I'm also going to have him back on his prednisone from 10 mg a day to 5 mg a day for the next week and then stop altogether. He will return to my office in about 2 months and sooner if needed.

## 2010-06-21 ENCOUNTER — Encounter (HOSPITAL_COMMUNITY): Payer: MEDICARE

## 2010-06-26 ENCOUNTER — Other Ambulatory Visit: Payer: Self-pay | Admitting: Nephrology

## 2010-06-26 ENCOUNTER — Encounter (HOSPITAL_COMMUNITY): Payer: Medicare Other | Attending: Nephrology

## 2010-06-26 DIAGNOSIS — N183 Chronic kidney disease, stage 3 unspecified: Secondary | ICD-10-CM | POA: Insufficient documentation

## 2010-06-26 DIAGNOSIS — D638 Anemia in other chronic diseases classified elsewhere: Secondary | ICD-10-CM | POA: Insufficient documentation

## 2010-06-26 LAB — IRON AND TIBC
Iron: 89 ug/dL (ref 42–135)
TIBC: 220 ug/dL (ref 215–435)
UIBC: 131 ug/dL

## 2010-07-01 ENCOUNTER — Other Ambulatory Visit: Payer: Self-pay | Admitting: Oncology

## 2010-07-01 ENCOUNTER — Encounter (HOSPITAL_BASED_OUTPATIENT_CLINIC_OR_DEPARTMENT_OTHER): Payer: Medicare Other | Admitting: Oncology

## 2010-07-01 DIAGNOSIS — D649 Anemia, unspecified: Secondary | ICD-10-CM

## 2010-07-01 LAB — CBC WITH DIFFERENTIAL/PLATELET
BASO%: 0.4 % (ref 0.0–2.0)
Eosinophils Absolute: 0.2 10*3/uL (ref 0.0–0.5)
MCHC: 33.4 g/dL (ref 32.0–36.0)
MONO#: 0.9 10*3/uL (ref 0.1–0.9)
NEUT#: 4.6 10*3/uL (ref 1.5–6.5)
RBC: 3.13 10*6/uL — ABNORMAL LOW (ref 4.20–5.82)
WBC: 8.4 10*3/uL (ref 4.0–10.3)
lymph#: 2.7 10*3/uL (ref 0.9–3.3)

## 2010-07-08 ENCOUNTER — Other Ambulatory Visit: Payer: Self-pay

## 2010-07-08 DIAGNOSIS — K5090 Crohn's disease, unspecified, without complications: Secondary | ICD-10-CM

## 2010-07-08 MED ORDER — INFLIXIMAB 100 MG IV SOLR: 5.0000 mg/kg | INTRAVENOUS | Status: DC

## 2010-07-08 NOTE — Telephone Encounter (Signed)
Short stay needed revised order with diagnoses and 83m/kg on the order

## 2010-07-09 NOTE — Op Note (Signed)
NAME:  Brent [name], NACLERIO NO.:  [id number]   MEDICAL RECORD NO.:  [id number]          PATIENT TYPE:  AMB   LOCATION:  ENDO                         FACILITY:  [facility]   PHYSICIAN:  Waverly Ferrari, M.D.    DATE OF BIRTH:  05-Apr-1945   DATE OF PROCEDURE:  08/31/2006  DATE OF DISCHARGE:                               OPERATIVE REPORT   PROCEDURE:  Colonoscopy.   INDICATIONS:  Colon cancer screening, Crohn's disease.   ANESTHESIA:  1. Fentanyl 100 mcg.  2. Versed 6 mg.   PROCEDURE:  With the patient mildly sedated in the left lateral  decubitus position, rectal examination was performed which was  unremarkable.  Prostate was not enlarged. Subsequently, the Pentax  videoscopic colonoscope was inserted into the rectum and passed under  direct vision to the cecum, identified by ileocecal valve and  appendiceal orifice, both of which were photographed.  The ileocecal  valve was scarred and widely open, and there was ulceration around the  opening of the ileocecal valve and somewhat proximally into the terminal  ileum, which we photographed.  We did separate biopsies of the terminal  ileum ulcers and withdrew the colonoscope, taking circumferential views  of the small bowel mucosa until the endoscope been pulled back into the  cecum, at which point we biopsied the ulcers around the ileocecal valve  that were on the colonic side in a separate container.  From this point,  the colonoscope was slowly withdrawn taking circumferential views of the  colonic mucosa of which remained looking and normal.  From that point  distally, we took random biopsies in four quadrants every 10 cm, until  we reached the rectum which appeared normal on direct and showed  hemorrhoids on retroflexed view.  The endoscope was straightened and  withdrawn.  The patient's vital signs and pulse oximeter remained  stable.  The patient tolerated the procedure well, without apparent  complications.   FINDINGS:  Probable Crohn's disease of terminal ileum and involving the  cecum around the ileocecal valve, with scarred widely open ileocecal  valve noted.  Otherwise, an unremarkable examination other than internal  hemorrhoids.   PLAN:  Await biopsy reports.  The patient will call me for results and  follow up with me as an outpatient.           ______________________________  Waverly Ferrari, M.D.     GMO/MEDQ  D:  08/31/2006  T:  08/31/2006  Job:  [id number]   cc:   Viviann Spare. Nyoka Cowden, M.D.  Fax: [phone]

## 2010-07-10 ENCOUNTER — Encounter (HOSPITAL_COMMUNITY): Payer: Medicare Other

## 2010-07-10 ENCOUNTER — Emergency Department (HOSPITAL_COMMUNITY): Payer: Medicare Other

## 2010-07-10 ENCOUNTER — Encounter (HOSPITAL_COMMUNITY): Payer: Self-pay | Admitting: Radiology

## 2010-07-10 ENCOUNTER — Inpatient Hospital Stay (HOSPITAL_COMMUNITY)
Admission: EM | Admit: 2010-07-10 | Discharge: 2010-07-15 | DRG: 418 | Disposition: A | Discharge status: Home / Self Care | Payer: Medicare Other | Attending: Family Medicine | Admitting: Family Medicine

## 2010-07-10 DIAGNOSIS — I2510 Atherosclerotic heart disease of native coronary artery without angina pectoris: Secondary | ICD-10-CM | POA: Diagnosis present

## 2010-07-10 DIAGNOSIS — E46 Unspecified protein-calorie malnutrition: Secondary | ICD-10-CM | POA: Diagnosis not present

## 2010-07-10 DIAGNOSIS — N183 Chronic kidney disease, stage 3 unspecified: Secondary | ICD-10-CM | POA: Diagnosis present

## 2010-07-10 DIAGNOSIS — D649 Anemia, unspecified: Secondary | ICD-10-CM | POA: Diagnosis present

## 2010-07-10 DIAGNOSIS — F17200 Nicotine dependence, unspecified, uncomplicated: Secondary | ICD-10-CM | POA: Diagnosis present

## 2010-07-10 DIAGNOSIS — K8000 Calculus of gallbladder with acute cholecystitis without obstruction: Principal | ICD-10-CM | POA: Diagnosis present

## 2010-07-10 DIAGNOSIS — I129 Hypertensive chronic kidney disease with stage 1 through stage 4 chronic kidney disease, or unspecified chronic kidney disease: Secondary | ICD-10-CM | POA: Diagnosis present

## 2010-07-10 DIAGNOSIS — K5090 Crohn's disease, unspecified, without complications: Secondary | ICD-10-CM | POA: Diagnosis present

## 2010-07-10 LAB — LIPASE, BLOOD: Lipase: 64 U/L — ABNORMAL HIGH (ref 11–59)

## 2010-07-10 LAB — DIFFERENTIAL
Basophils Relative: 0 % (ref 0–1)
Eosinophils Absolute: 0.1 10*3/uL (ref 0.0–0.7)
Monocytes Absolute: 0.6 10*3/uL (ref 0.1–1.0)
Monocytes Relative: 6 % (ref 3–12)

## 2010-07-10 LAB — URINALYSIS, ROUTINE W REFLEX MICROSCOPIC
Glucose, UA: NEGATIVE mg/dL
Nitrite: NEGATIVE
Specific Gravity, Urine: 1.009 (ref 1.005–1.030)
pH: 6 (ref 5.0–8.0)

## 2010-07-10 LAB — COMPREHENSIVE METABOLIC PANEL
AST: 31 U/L (ref 0–37)
BUN: 15 mg/dL (ref 6–23)
CO2: 23 mEq/L (ref 19–32)
Calcium: 8.9 mg/dL (ref 8.4–10.5)
Creatinine, Ser: 1.99 mg/dL — ABNORMAL HIGH (ref 0.4–1.5)
GFR calc Af Amer: 41 mL/min — ABNORMAL LOW (ref >60)
GFR calc non Af Amer: 34 mL/min — ABNORMAL LOW (ref >60)

## 2010-07-10 LAB — CBC
Hemoglobin: 11.5 g/dL — ABNORMAL LOW (ref 13.0–17.0)
MCH: 33.9 pg (ref 26.0–34.0)
MCHC: 33 g/dL (ref 30.0–36.0)
Platelets: 185 10*3/uL (ref 150–400)

## 2010-07-10 MED ORDER — IOHEXOL 300 MG/ML  SOLN
50.0000 mL | Freq: Once | INTRAMUSCULAR | Status: AC | PRN
  Administered 2010-07-10: 75 mL via INTRAVENOUS

## 2010-07-11 ENCOUNTER — Inpatient Hospital Stay (HOSPITAL_COMMUNITY): Payer: Medicare Other

## 2010-07-11 ENCOUNTER — Other Ambulatory Visit (HOSPITAL_COMMUNITY): Payer: MEDICARE

## 2010-07-11 DIAGNOSIS — K5000 Crohn's disease of small intestine without complications: Secondary | ICD-10-CM

## 2010-07-11 DIAGNOSIS — R1011 Right upper quadrant pain: Secondary | ICD-10-CM

## 2010-07-11 DIAGNOSIS — R933 Abnormal findings on diagnostic imaging of other parts of digestive tract: Secondary | ICD-10-CM

## 2010-07-11 LAB — COMPREHENSIVE METABOLIC PANEL
ALT: 40 U/L (ref 0–53)
AST: 28 U/L (ref 0–37)
Albumin: 3.1 g/dL — ABNORMAL LOW (ref 3.5–5.2)
Alkaline Phosphatase: 108 U/L (ref 39–117)
CO2: 21 mEq/L (ref 19–32)
Chloride: 109 mEq/L (ref 96–112)
GFR calc Af Amer: 52 mL/min — ABNORMAL LOW (ref >60)
Potassium: 3.9 mEq/L (ref 3.5–5.1)
Sodium: 140 mEq/L (ref 135–145)
Total Bilirubin: 0.5 mg/dL (ref 0.3–1.2)

## 2010-07-11 LAB — CBC
Hemoglobin: 11.7 g/dL — ABNORMAL LOW (ref 13.0–17.0)
Platelets: 200 10*3/uL (ref 150–400)
RBC: 3.3 MIL/uL — ABNORMAL LOW (ref 4.22–5.81)
WBC: 14.4 10*3/uL — ABNORMAL HIGH (ref 4.0–10.5)

## 2010-07-11 LAB — DRUGS OF ABUSE SCREEN W/O ALC, ROUTINE URINE
Benzodiazepines.: NEGATIVE
Marijuana Metabolite: NEGATIVE
Opiate Screen, Urine: NEGATIVE
Propoxyphene: NEGATIVE

## 2010-07-11 LAB — IRON AND TIBC
Saturation Ratios: 12 % — ABNORMAL LOW (ref 20–55)
TIBC: 206 ug/dL — ABNORMAL LOW (ref 215–435)
UIBC: 182 ug/dL

## 2010-07-11 LAB — FOLATE: Folate: 17.7 ng/mL

## 2010-07-12 ENCOUNTER — Inpatient Hospital Stay (HOSPITAL_COMMUNITY): Payer: Medicare Other

## 2010-07-12 HISTORY — PX: CHOLECYSTECTOMY: SHX55

## 2010-07-12 LAB — CBC
HCT: 32.3 % — ABNORMAL LOW (ref 39.0–52.0)
Platelets: 173 10*3/uL (ref 150–400)
RDW: 13.1 % (ref 11.5–15.5)
WBC: 16.3 10*3/uL — ABNORMAL HIGH (ref 4.0–10.5)

## 2010-07-12 LAB — DIFFERENTIAL
Basophils Absolute: 0 10*3/uL (ref 0.0–0.1)
Eosinophils Absolute: 0 10*3/uL (ref 0.0–0.7)
Eosinophils Relative: 0 % (ref 0–5)
Lymphocytes Relative: 8 % — ABNORMAL LOW (ref 12–46)
Neutrophils Relative %: 78 % — ABNORMAL HIGH (ref 43–77)

## 2010-07-12 LAB — COMPREHENSIVE METABOLIC PANEL
ALT: 30 U/L (ref 0–53)
Albumin: 2.6 g/dL — ABNORMAL LOW (ref 3.5–5.2)
Alkaline Phosphatase: 91 U/L (ref 39–117)
Potassium: 4.3 mEq/L (ref 3.5–5.1)
Sodium: 142 mEq/L (ref 135–145)
Total Protein: 5.9 g/dL — ABNORMAL LOW (ref 6.0–8.3)

## 2010-07-12 MED ORDER — TECHNETIUM TC 99M MEBROFENIN IV KIT
5.0000 | PACK | Freq: Once | INTRAVENOUS | Status: AC | PRN
  Administered 2010-07-12: 5.3 via INTRAVENOUS

## 2010-07-12 NOTE — Op Note (Signed)
NAME:  Brent, [name] NO.:  [id number]   MEDICAL RECORD NO.:  [id number]          PATIENT TYPE:  AMB   LOCATION:  [place]                          FACILITY:  [facility]   PHYSICIAN:  Estill Bamberg. Ronnie Derby, M.D. DATE OF BIRTH:  [birth year]   DATE OF PROCEDURE:  10/15/2005  DATE OF DISCHARGE:                                 OPERATIVE REPORT   SURGEON:  Estill Bamberg. Ronnie Derby, M.D.   ASSISTANT:  None.   ANESTHESIA:  General.   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   Informed consent obtained.   DESCRIPTION OF PROCEDURE:  Patient placed in supine position, general  anesthesia, placed in a beach chair position.  Right shoulder prepped and  draped in the usual sterile fashion.  Anterior, posterior direct lateral  portals were created with a #11 blade, blunt trocar and canula.  Diagnostic  arthroscopy revealed really no arthritis in the glenohumeral joint itself.  There was some fraying of and tearing of the anterior labrum.  The biceps  appear to be well attached.  The under surface of the rotator cuff appeared  normal.  The 3.2 gator shaver was used through the anterior portal to  perform a debridement of the labral tearing.  Then redirected the scope into  the subacromial space and from the direct lateral port we performed a  bursectomy with the 3.2 gator shaver.  Then used the arthroscope debridement  wand to release the CA ligament and then I used the 4.0 mm __________ to  perform an aggressive anterior lateral acromioplasty and under surface this  clavicle resection.  I then debrided the rotator cuff.  There was no full  thickness tears.  I then lavage closed with #4-0 nylon sutures, dressed with  Xeroform dressing, sponges, 2 inch silk tape and a simple sling.   COMPLICATIONS:  None.   DRAINS:  None.           ______________________________  Estill Bamberg. Ronnie Derby, M.D.     SDL/MEDQ  D:   10/15/2005  T:  10/15/2005  Job:  [id number]

## 2010-07-13 LAB — CBC
Hemoglobin: 9.9 g/dL — ABNORMAL LOW (ref 13.0–17.0)
MCH: 33.6 pg (ref 26.0–34.0)
MCV: 101.7 fL — ABNORMAL HIGH (ref 78.0–100.0)
RBC: 2.95 MIL/uL — ABNORMAL LOW (ref 4.22–5.81)

## 2010-07-13 LAB — COMPREHENSIVE METABOLIC PANEL
AST: 26 U/L (ref 0–37)
BUN: 18 mg/dL (ref 6–23)
CO2: 24 mEq/L (ref 19–32)
Chloride: 108 mEq/L (ref 96–112)
Creatinine, Ser: 2.04 mg/dL — ABNORMAL HIGH (ref 0.4–1.5)
GFR calc non Af Amer: 33 mL/min — ABNORMAL LOW (ref >60)
Total Bilirubin: 0.6 mg/dL (ref 0.3–1.2)

## 2010-07-14 LAB — COMPREHENSIVE METABOLIC PANEL
ALT: 26 U/L (ref 0–53)
AST: 19 U/L (ref 0–37)
Albumin: 2 g/dL — ABNORMAL LOW (ref 3.5–5.2)
CO2: 24 mEq/L (ref 19–32)
Calcium: 7.9 mg/dL — ABNORMAL LOW (ref 8.4–10.5)
GFR calc Af Amer: 41 mL/min — ABNORMAL LOW (ref >60)
GFR calc non Af Amer: 34 mL/min — ABNORMAL LOW (ref >60)
Sodium: 137 mEq/L (ref 135–145)
Total Protein: 5 g/dL — ABNORMAL LOW (ref 6.0–8.3)

## 2010-07-14 LAB — CBC
Hemoglobin: 9.4 g/dL — ABNORMAL LOW (ref 13.0–17.0)
MCHC: 33.1 g/dL (ref 30.0–36.0)

## 2010-07-15 ENCOUNTER — Other Ambulatory Visit: Payer: Self-pay | Admitting: General Surgery

## 2010-07-15 LAB — CBC
MCV: 101.3 fL — ABNORMAL HIGH (ref 78.0–100.0)
Platelets: 190 10*3/uL (ref 150–400)
RBC: 3.11 MIL/uL — ABNORMAL LOW (ref 4.22–5.81)
WBC: 9.4 10*3/uL (ref 4.0–10.5)

## 2010-07-15 LAB — BASIC METABOLIC PANEL
Chloride: 105 mEq/L (ref 96–112)
GFR calc Af Amer: 41 mL/min — ABNORMAL LOW (ref >60)
Potassium: 4 mEq/L (ref 3.5–5.1)
Sodium: 137 mEq/L (ref 135–145)

## 2010-07-17 ENCOUNTER — Encounter (HOSPITAL_COMMUNITY): Payer: MEDICARE

## 2010-07-23 NOTE — Consult Note (Signed)
NAME:  Brent, [name] NO.:  [id number]  MEDICAL RECORD NO.:  [id number]           PATIENT TYPE:  I  LOCATION:  6384                         FACILITY:  [facility]  PHYSICIAN:  Marland Kitchen T. Amica Harron, M.D.DATE OF BIRTH:  [birth year]  DATE OF CONSULTATION:  07/11/2010 DATE OF DISCHARGE:                                CONSULTATION   REQUESTING PHYSICIAN:  Malcolm T. Fuller Plan, MD, Marval Regal  PRIMARY GASTROENTEROLOGIST:  Milus Banister, MD  PRIMARY CARE PHYSICIAN:  Dr. Posey Pronto with [facility].  CONSULTING SURGEON:  Marland Kitchen T. Charnae Lill, MD  REASON FOR CONSULTATION:  Questionable acute cholecystitis.  HISTORY OF PRESENT ILLNESS:  [name] is a 65 year old black male with a history of Crohn disease for 40 years as well as hypertension and chronic kidney disease who developed diffuse abdominal pain approximately 2 weeks ago.  He states that the pain at its greatest is most severe just inferior and lateral on the left side of his umbilicus. He denies any nausea or vomiting except for one episode of bilious emesis here at the hospital.  He has been having diarrhea for the last 2 weeks which is abnormal for him.  He has not been running any fevers at home but does admit to chills.  Because of continued pain over the last 2 weeks, the patient presented to his primary care's office yesterday. They did send him to the emergency department due to the severity of pain.  Upon arrival to the emergency department, the patient was given IV pain medicine.  After this was administered, the patient's pain significantly improved and he has not had any significant pain since then just soreness.  He did have a CT scan upon arrival secondary to known that he has active Crohn disease.  The CT scan revealed previous bowel resections related to Crohn enteritis.  There was thickening of the distal small bowel in the proximal colon to the hepatic flexure which is suggestive of active Crohn  enteritis.  There is no abscess or free air or obstruction seen.  They also have findings of a markedly dilated gallbladder with small amount of adjacent fluid.  Cholecystitis is a concern given the appearance, however, no gallstones are seen. Because of this finding, we have been asked to evaluate the patient for possible cholecystitis.  REVIEW OF SYSTEMS:  Please see HPI.  Otherwise, all other systems have been reviewed and are negative.  FAMILY HISTORY:  Noncontributory.  PAST MEDICAL HISTORY: 1. Crohn disease x40 years. 2. Hypertension. 3. Chronic kidney disease.  PAST SURGICAL HISTORY:  Laparotomy x2 for bowel resection.  SOCIAL HISTORY:  The patient lives in [place].  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  He smokes about a pack a day.  He denies any alcohol or illicit drug abuse.  ALLERGIES:  IMURAN.  MEDICATIONS AT HOME: 1. Cholestyramine powder. 2. Calcium and vitamin D. 3. Procrit. 4. Remicade. 5. Olanzapine. 6. Vitamin D. 7. Magnesium oxide. 8. Metoprolol. 9. Amlodipine. 10.Oxybutynin. 11.Ammonia lactate solution. 12.Depakote. 13.Colace. 14.Linezolid. 15.Ferrous sulfate. 16.Multivitamin.  PHYSICAL EXAMINATION:  GENERAL:  [name] is a  pleasant 65 year old black male who is well developed and well nourished in no acute distress. VITAL SIGNS:  Temperature 99.6, pulse 76, respirations 21, and blood pressure 159/71. HEENT:  Head is normocephalic and atraumatic.  Sclerae noninjected. Pupils are equal, round, and reactive to light.  Ears and nose without any obvious masses or lesions.  No rhinorrhea.  Mouth is pink.  Throat shows no exudate. HEART:  Regular rate and rhythm.  Normal S1 and S2.  No murmurs, gallops, or rubs are noted.  He does have palpable carotid, radial, and pedal pulses bilaterally. LUNGS:  Clear to auscultation bilaterally with no wheezes, rhonchi, or rales noted.  Respiratory effort is nonlabored. ABDOMEN:  Soft and  essentially nontender.  He is otherwise nondistended with active bowel sounds.  He does not have any guarding or rebounding. He has a negative Murphy sign.  He does have a paramedian as well as a midline scar present from his prior bowel resection.  No masses, hernias, or organomegaly are noted. MUSCULOSKELETAL:  All 4 extremities are symmetrical.  No cyanosis, clubbing, or edema. NEUROLOGIC:  Cranial nerves II through XII appear to be grossly intact. Deep tendon reflex exam is deferred at this time. PSYCHIATRIC:  The patient is alert and oriented x3 with an appropriate affect.  LABORATORY DATA AND DIAGNOSTICS:  Sodium 140, potassium 3.9, glucose 133, BUN 14, and creatinine 1.62.  Total bilirubin 0.5, alkaline phosphatase 108, AST 28, and ALT 40.  White blood cell count is 14,400, hemoglobin 11.7, hematocrit 33.3, and platelet count is 200,000. Diagnostics:  CT scan of the abdomen and pelvis revealed active Crohn enteritis of the distal small bowel and proximal colon to the hepatic flexure with no abscess, free air, or obstruction along with a markedly dilated gallbladder with small amount of adjacent fluid.  Cholecystitis is a concern given this appearance.  IMPRESSION: 1. Abdominal pain. 2. Active Crohn disease. 3. Distended gallbladder. 4. Chronic kidney disease. 5. Hypertension.  PLAN:  The patient currently does not have symptoms consistent with straightforward finding of cholecystitis.  I would expect after 2 weeks of severe abdominal pain that the patient's LFTs may be somewhat abnormal, however, they are not.  He does definitely have a distended gallbladder on his CT scan, however, it is quite possible secondary to his active Crohn's along the ascending colon and hepatic flexure that some of the fluid surrounding the gallbladder may be secondary to his active Crohn disease as apposed to cholecystitis.  He does state that this pain is different from his normal Crohn  disease.  Therefore instead of obtaining an ultrasound at this time which he eventually may need, we will go ahead and order have a HIDA scan to rule out acute cholecystitis versus rule it in.  I have discussed with the patient the possibility that this may be cholecystitis and if so, he may need to undergo a laparoscopic cholecystectomy.  I have also discussed with him the possibility that this may not be his gallbladder and of course at that time he would not need to undergo a cholecystectomy.  We will follow the patient along with you.  We will await his HIDA scan for further recommendations.     Henreitta Cea, PA   ______________________________ Darene Lamer. Lewanda Perea, M.D.    KEO/MEDQ  D:  07/11/2010  T:  07/12/2010  Job:  [id number]  cc:   Pricilla Riffle. Fuller Plan, MD, Lavon Paganini, MD Dr. Posey Pronto Dr. [facility]  Electronically Signed  by Saverio Danker PA on 07/12/2010 02:34:20 PM Electronically Signed by Excell Seltzer M.D. on 07/23/2010 10:07:30 AM

## 2010-07-23 NOTE — Op Note (Signed)
NAME:  Brent [name], Brent [name] NO.:  [id number]  MEDICAL RECORD NO.:  [id number]           PATIENT TYPE:  I  LOCATION:  3500                         FACILITY:  [facility]  PHYSICIAN:  Marland Kitchen T. Caeson Filippi, M.D.DATE OF BIRTH:  1945/09/05  DATE OF PROCEDURE:  07/12/2010 DATE OF DISCHARGE:                              OPERATIVE REPORT   PREOPERATIVE DIAGNOSIS:  Acute cholecystitis.  POSTOPERATIVE DIAGNOSIS:  Acute cholecystitis and cholelithiasis.  SURGICAL PROCEDURES:  Laparoscopic cholecystectomy with intraoperative cholangiogram.  SURGEON:  Marland Kitchen T. Djuana Littleton, MD  ASSISTANT:  Merri Ray. Grandville Silos, MD  ANESTHESIA:  General.  BRIEF HISTORY:  [name] is a 65 year old male with a history of Crohn disease with two previous ileocolonic resections.  He presents with about 2 weeks of progressive pain, which he described in his mid abdomen and constant.  He has had a CT scan showing a markedly distended gallbladder.  HIDA was then performed, which showed nonvisualization of the gallbladder consistent with acute cholecystitis.  He has localized tenderness and guarding in the right upper quadrant, progressively increasing white count, and normal LFTs.  I have recommended proceeding with laparoscopic and possible open cholecystectomy for apparent acute cholecystitis.  We discussed the nature of the procedure, its indications, risks of bleeding, infection, bile leak, bile duct injury, possible need for open procedure.  He was now brought to the operating room for this procedure.  DESCRIPTION OF OPERATION:  The patient was brought to the operating room and placed in supine position on the operating table, and general endotracheal anesthesia was induced.  He received preoperative IV antibiotics.  PAS were in place.  The abdomen was widely sterilely prepped and draped.  Correct patient and procedure were verified.  I used about a centimeter and a half area of the patient's  previous midline incision where it skirted the umbilicus, and dissection was carried down the midline fascia, which was incised under direct vision for about a centimeter.  The peritoneum was identified, elevated, and carefully sharply entered.  Feeling with my finger, I felt relatively free space along the anterior abdominal wall, working superiorly, and the Hasson trocar was placed through a mattress suture of 0 Vicryl and pneumoperitoneum established.  There were actually extensive adhesions in the upper abdomen, but they were filmy; and using careful blunt dissection with the camera, I was able to get into a free area around the liver.  I could see the falciform ligament clearly and the edge of the liver and a markedly distended inflamed gallbladder.  Under direct vision, am 11-mm trocar was placed subxiphoid just to the right of falciform ligament.  Working through these two ports, I was then able to clear an area in the right upper quadrant of the adhesions.  These were omental and bowel adhesions, but they were filmy.  I stripped down easily off the anterior abdominal wall, and I was not concerned about any bowel injury.  After working space was clear in the right upper quadrant, I was able to put two 5-mm trocars laterally in the right upper quadrant.  Again, the gallbladder was markedly distended with  severe acute inflammation.  The gallbladder was aspirated and decompressed and contained purulent material.  It was very large and redundant.  The gallbladder was grasped and the fundus elevated up over the liver and further inflammatory attachments were taken down off the infundibulum of the gallbladder.  This was a thick inflammatory peel, but appeared to be just fatty tissue and inflammatory peel with no intestinal adhesions to the gallbladder.  The infundibulum was cleared and then retracted inferolaterally.  Careful dissection was carried along the gallbladder toward the porta  hepatis.  Staying right on the gallbladder wall.  This was a tedious difficult dissection, but progressed satisfactorily.  There was a large stone impacted to neck of the gallbladder.  As I dissected down along the gallbladder medial to this, it tapered down to the cystic duct.  We used careful dissection and closed triangle, and I did not identify the cystic artery coursing up onto the gallbladder wall, and this was divided with harmonic scalpel.  The cystic duct was then carefully skeletonized at the gallbladder junction and dissected out over about a centimeter and after slow tedious dissection, we obtained a good critical view and felt very confident of the anatomy.  At this point, a cholangiogram was obtained. The cystic duct was somewhat friable and rather clipping.  I just made an opening in the cystic duct adjacent to the gallbladder and the Reddick catheter was placed and cholangiogram obtained.  This showed relatively long remnant of the cystic duct, mildly dilated common bile duct and intrahepatic ducts, but there was flow into the duodenum, and no filling defects in good filling of the hepatic ducts.  At this point, the cholangiocath was removed.  I completely divided the cystic duct at the gallbladder and then the cystic duct was secured very nicely with a PDS Endoloop.  The gallbladder was then dissected free from its bed. Due to the size and inflammation, this was a long tedious dissection but progressed satisfactorily, and the gallbladder was detached, placed in EndoCatch bag.  This was brought out through the umbilical incision. Gallbladder wall was very thick.  A couple of stones were extracted and the gallbladder was removed somewhat piecemeal through the incision and the bag and gallbladder removed completely.  We then thoroughly irrigated the right upper quadrant.  Hemostasis was obtained in the gallbladder bed.  Entire operative area was examined.  There was  no evidence of bleeding, bowel injury, or other complications.  A Surgicel pack was left in the gallbladder fossa.  A 19 Blake closed suction drain was brought toward the lateral 5-mm trocar sites and left in the gallbladder fossa Morison pouch.  All CO2 was evacuated.  Trocars were removed.  The mattress suture was secured at the periumbilical incision and additional 0 Vicryl of after suture placed here.  Also placed a suture in the fascia at the subxiphoid site as the patient was quite thin.  Skin incisions were closed with subcuticular Monocryl and Dermabond.  Sponge, needles, and counts are correct.  The patient was taken to recovery room in good condition.     Darene Lamer. Ravneet Spilker, M.D.     [place]  D:  07/12/2010  T:  07/13/2010  Job:  [id number]  Electronically Signed by Excell Seltzer M.D. on 07/23/2010 10:08:25 AM

## 2010-07-25 NOTE — H&P (Signed)
NAME:  Brent [name], BADOLATO NO.:  [id number]  MEDICAL RECORD NO.:  [id number]           PATIENT TYPE:  E  LOCATION:  [facility]                         FACILITY:  [facility]  PHYSICIAN:  Barbette Merino, M.D.      DATE OF BIRTH:  1945/10/16  DATE OF ADMISSION:  07/10/2010 DATE OF DISCHARGE:                             HISTORY & PHYSICAL   PRIMARY CARE PHYSICIAN:  Dr. Posey Pronto with [facility].  PRESENTING COMPLAINT:  Abdominal pain and diarrhea.  HISTORY OF PRESENT ILLNESS:  The patient is 65 year old gentleman with known history of Crohn disease who has had 2 prior surgeries mainly intestinal resection presenting here today with 2 weeks of continuous abdominal pain.  The patient apparently has been trying to take his medications at home.  He was supposed to start Remicade today at [facility] but he was unable to go there due to the pain and decided to come to the emergency room.  He has had at least three episode of diarrhea in the morning and four in the afternoon.  He has been taking cholestyramine to try and slow it down but that has not worked.  He had one episode of vomiting which was green in nature, otherwise, mildly nauseated.  His main problem is the pain that he rates as 8-10 out of 10, located around the periumbilical region but mainly radiating to all side of the abdomen, worsened with movement and food and no significant relieving factor.  He denied any fever or chills.  He was on prednisone and took 2 months ago when it was stopped.  The patient said he has had good control for a long time until now.  PAST MEDICAL HISTORY:  Significant for: 1. Crohn disease. 2. Hypertension. 3. Chronic kidney disease. 4. Anemia of chronic disease. 5. Tobacco dependence.  CURRENT MEDICATIONS: 1. Cholestyramine powder twice a day. 2. Calcium with vitamin D 600-400 mg 1 tablet twice a day. 3. Procrit 10,000 units every 2 weeks. 4. Remicade 100 mg every 2 weeks. 5.  Olanzapine 5 mg daily. 6. Vitamin E 1200 units daily. 7. Magnesium oxide 400 mg daily. 8. Metoprolol 50 mg daily. 9. Amlodipine 10 mg daily. 10.Oxybutynin 50 mg 2 tablets daily. 11.Ammonium lactate solution to the skin. 12.Depakote 500 mg three times at night. 13.Colace 50 mg daily. 14.Linezolid solution 1 spray each nostrils twice a day. 15.Ferrous sulfate 325 mg twice daily. 16.Also multivitamins 1 tablet daily.  ALLERGIES:  QUINOLONE and PLENDIL.  SOCIAL HISTORY:  The patient lives in [place].  He smokes about half to one pack per day.  Denied any alcohol.  Denied any IV drug use.  FAMILY HISTORY:  Denied any significant family history except for hypertension.  REVIEW OF SYSTEMS:  All systems reviewed are negative except per HPI.  PHYSICAL EXAMINATION:  VITAL SIGNS:  Temperature 98.3, blood pressure 145/76, his pulse 105, respiratory rate 20, sats 96% on room air. GENERAL:  He is awake, alert, oriented.  He is lying in a fetal position with mild distress due to pain. HEENT:  PERRL.  EOMI.  No pallor, no jaundice.  No rhinorrhea. NECK:  Supple.  No visible JVD, no lymphadenopathy. RESPIRATORY:  He has good air entry bilaterally.  No wheezes, no rales, no crackles. CARDIOVASCULAR SYSTEM:  He has S1-S2, no audible murmurs. ABDOMEN:  Soft, firm with two visible scars from previous surgeries. They are firm with diffuse tenderness. SKIN:  No other rashes or ulcers. MUSCULOSKELETAL:  No joint swelling or tenderness.  LABORATORIES:  White count is 10.0 with normal differentials.  His hemoglobin is 11.5 with an MCV of 102.9, platelet is 185.  Sodium is 143, potassium 3.2, chloride 111, CO2 23, glucose 98, BUN 15, creatinine 1.99.  Total bilirubin 0.5, alkaline phosphatase 112, AST 31, ALT 45, total protein 7.0, albumin 3.4 and calcium 8.9.  His lipase is 64. Urinalysis is negative.  CT abdomen and pelvis shows previous bowel resection related to Crohn enteritis.  There is  thickening of the distal small bowel and proximal colon to the hepatic flexure which will suggest active Crohn enteritis, no abscess or free air.  There is also markedly dilated gallbladder with a small amount of adjacent fluid. Cholecystitis is a concern but no calcified gallstone.  There is potential for distal biliary obstruction.  The patient has chronic benign left adrenal adenoma and benign renal cyst.  ASSESSMENT:  This is a 65 year old gentleman with known history of Crohn disease who is presenting with what appears to be acute flare of his Crohn's.  There is also questionable cholecystitis.  PLAN: 1. Acute flare of Crohn disease.  Due to the patient's known history     and the fact that he is supposed to get Remicade but he did not get     it.  We will admit him for inpatient care.  I will put him on clear     liquids, IV steroids, pain control.  We will get GI consult.  I     will start him on some Flagyl empirically while getting blood     cultures.  2.  Hypokalemia probably from his diarrhea and vomiting.     We will replete his potassium and follow his potassium closely. 2. Hypertension.  I will continue his amlodipine and if possible add     the beta-blocker as well. 3. Chronic kidney disease, III.  Continue to watch his creatinine     closely.  Probably he is at his baseline. 4. Anemia is microcytic.  I will check folate and B12 levels. 5. Tobacco dependence.  The patient will be counseled and I will give     him nicotine patch. 6. Possible cholecystitis.  Again, I will get right upper quadrant     abdominal ultrasound.  If cholecystitis is confirmed from the     ultrasound, I will change his antibiotic.  In the meantime, I will     just continue with the Flagyl.  Further treatment will depend on     how the patient responds to these measures.     Barbette Merino, M.D.     [place]  D:  07/10/2010  T:  07/10/2010  Job:  [id number]  Electronically Signed by Barbette Merino  M.D. on 07/25/2010 11:48:23 AM

## 2010-07-26 ENCOUNTER — Other Ambulatory Visit: Payer: Self-pay | Admitting: Internal Medicine

## 2010-07-26 ENCOUNTER — Ambulatory Visit
Admission: RE | Admit: 2010-07-26 | Discharge: 2010-07-26 | Disposition: A | Discharge status: Home / Self Care | Payer: Medicare Other | Source: Ambulatory Visit | Attending: Internal Medicine | Admitting: Internal Medicine

## 2010-07-26 DIAGNOSIS — R0602 Shortness of breath: Secondary | ICD-10-CM

## 2010-07-26 DIAGNOSIS — R05 Cough: Secondary | ICD-10-CM

## 2010-08-06 ENCOUNTER — Other Ambulatory Visit (INDEPENDENT_AMBULATORY_CARE_PROVIDER_SITE_OTHER): Payer: Self-pay

## 2010-08-06 ENCOUNTER — Encounter: Payer: Self-pay | Admitting: Gastroenterology

## 2010-08-06 ENCOUNTER — Ambulatory Visit (INDEPENDENT_AMBULATORY_CARE_PROVIDER_SITE_OTHER): Payer: Self-pay | Admitting: Gastroenterology

## 2010-08-06 VITALS — BP 100/60 | HR 88 | Ht 76.0 in | Wt 159.0 lb

## 2010-08-06 DIAGNOSIS — K5090 Crohn's disease, unspecified, without complications: Secondary | ICD-10-CM

## 2010-08-06 LAB — CBC WITH DIFFERENTIAL/PLATELET
Basophils Relative: 0.4 % (ref 0.0–3.0)
Eosinophils Relative: 2.7 % (ref 0.0–5.0)
HCT: 29.3 % — ABNORMAL LOW (ref 39.0–52.0)
Hemoglobin: 9.8 g/dL — ABNORMAL LOW (ref 13.0–17.0)
Lymphs Abs: 2.5 10*3/uL (ref 0.7–4.0)
MCV: 103.2 fl — ABNORMAL HIGH (ref 78.0–100.0)
Monocytes Absolute: 0.7 10*3/uL (ref 0.1–1.0)
RBC: 2.84 Mil/uL — ABNORMAL LOW (ref 4.22–5.81)
WBC: 9.4 10*3/uL (ref 4.5–10.5)

## 2010-08-06 LAB — COMPREHENSIVE METABOLIC PANEL
Albumin: 3.5 g/dL (ref 3.5–5.2)
Alkaline Phosphatase: 75 U/L (ref 39–117)
BUN: 17 mg/dL (ref 6–23)
CO2: 26 mEq/L (ref 19–32)
GFR: 45.66 mL/min — ABNORMAL LOW (ref >60.00)
Glucose, Bld: 83 mg/dL (ref 70–99)
Total Bilirubin: 0.5 mg/dL (ref 0.3–1.2)

## 2010-08-06 MED ORDER — INFLIXIMAB 100 MG IV SOLR: INTRAVENOUS | Status: DC

## 2010-08-06 NOTE — Patient Instructions (Addendum)
One of your biggest health concerns is your smoking.  This increases your risk for most cancers and serious cardiovascular diseases such as strokes, heart attacks.  You should try your best to stop.  If you need assistence, please contact your PCP or Smoking Cessation Class at [facility] [phone]) or [place] (1-800-QUIT-NOW). Smoking is particularly bad for your Crohn's disease. Restart you remicade (we will facilitate getting you back in to [facility]). We have scheduled you at [facility] for your Remicade infusion on 08/08/10 at 5:00pm.  You will have labs checked today in the basement lab.  Please head down after you check out with the front desk  (cbc, cmet). Return to see Dr. Ardis Hughs in 6 weeks.

## 2010-08-06 NOTE — Progress Notes (Signed)
Review of gastrointestinal problems:  1. crohn's diagnosed in 1970. He had abd surgeries (one in 70's, one in 80's). He believes only ileum was removed.Colonoscopy with Dr. Lajoyce Corners in mid 2008: this showed a wide-open IC valve, biopsies of ileum and colon showed chronic, active colitis however macroscopically he described it as a fairly normal-appearing. Colonoscopy Dr. Ardis Hughs 12/11 found no colitis, but actively ulcerated and inflamed neoterminal ileum (while he was on prednisone). 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   January 2012 TB skin testing was negative   Remicade started February 2012 at 5 mg per kilogram   Cholestyramine started March, 2012  2. acute cholecystitis May 2012. Laparoscopic cholecystectomy   HPI: This is a very pleasant 65 year old man whom I last saw about 2 months ago. Since then he had acute cholecystitis treated surgically with laparoscopic cholecystectomy.  He had acute cholecystitis 1 month ago and had cholecystectomy. Hospitalized for 5 days.  Surgery went pretty well.  Seen by surgeon last week, did well.  Because of this, remicade was delayed, has not resumed it.  Loose stools, diet related.    he has felt more fatigued than usual. He is off of prednisone, still takes cholestyramine.   Past Medical History:   Crohn's                                                      Anxiety                                                      Depression                                                   Kidney problem                                             Past Surgical History:   SMALL INTESTINE SURGERY                                        Comment:x 2   CHOLECYSTECTOMY                                 07-12-2010   reports that he has been smoking Cigarettes.  He has a 24.5 pack-year smoking history. He has never used smokeless tobacco. He reports that he does not drink alcohol or use illicit drugs. family history includes Diabetes in  his mother; Emphysema in his father; and Pneumonia in his maternal grandmother.  There is no history of Colon cancer.    Current medicines and allergies were reviewed in [facility]    Physical Exam: BP 100/60  Pulse 88  Ht 6' 4"  (1.93 m)  Wt 159 lb (72.122 kg)  BMI 19.35 kg/m2 Constitutional: generally well-appearing Psychiatric: alert and oriented x3 Abdomen: soft, nontender, nondistended, no obvious ascites, no peritoneal signs, normal bowel sounds     Assessment and plan: 65 y.o. male with  Crohn's disease, recent acute cholecystitis  He will resume his Remicade which we will help facilitate. He will get a basic set of labs today including CBC, complete metabolic profile. He'll return to see me in 6 weeks and sooner if needed. I explained to him that smoking cigarettes is terrible for Crohn's disease and that he should do his best to try to stop.

## 2010-08-07 ENCOUNTER — Telehealth: Payer: Self-pay | Admitting: *Deleted

## 2010-08-07 NOTE — Telephone Encounter (Signed)
Informed pt per Dr Ardis Hughs, the labs were all around his usual levels- he should continue with Dr Ardis Hughs' suggestions given at the last visit. Pt stated understanding.

## 2010-08-07 NOTE — Telephone Encounter (Signed)
Message copied by Lance Morin on Wed Aug 07, 2010  2:44 PM ------      Message from: Owens Loffler P      Created: Wed Aug 07, 2010  8:06 AM                   Please call the patient.  The labs were all around his usual levels.  Should continue with the suggestions outlined at recent visit.

## 2010-08-08 ENCOUNTER — Encounter (HOSPITAL_COMMUNITY): Payer: Medicare Other | Attending: Gastroenterology

## 2010-08-08 DIAGNOSIS — F341 Dysthymic disorder: Secondary | ICD-10-CM | POA: Insufficient documentation

## 2010-08-08 DIAGNOSIS — Z79899 Other long term (current) drug therapy: Secondary | ICD-10-CM | POA: Insufficient documentation

## 2010-08-08 DIAGNOSIS — K5080 Crohn's disease of both small and large intestine without complications: Secondary | ICD-10-CM | POA: Insufficient documentation

## 2010-08-09 ENCOUNTER — Other Ambulatory Visit: Payer: Self-pay | Admitting: Oncology

## 2010-08-09 ENCOUNTER — Encounter (HOSPITAL_BASED_OUTPATIENT_CLINIC_OR_DEPARTMENT_OTHER): Payer: Medicare Other | Admitting: Oncology

## 2010-08-09 DIAGNOSIS — D649 Anemia, unspecified: Secondary | ICD-10-CM

## 2010-08-09 DIAGNOSIS — N289 Disorder of kidney and ureter, unspecified: Secondary | ICD-10-CM

## 2010-08-09 LAB — CBC WITH DIFFERENTIAL/PLATELET
BASO%: 0.3 % (ref 0.0–2.0)
Eosinophils Absolute: 0.3 10*3/uL (ref 0.0–0.5)
HCT: 28 % — ABNORMAL LOW (ref 38.4–49.9)
LYMPH%: 28.6 % (ref 14.0–49.0)
MONO#: 0.6 10*3/uL (ref 0.1–0.9)
NEUT#: 5.3 10*3/uL (ref 1.5–6.5)
Platelets: 169 10*3/uL (ref 140–400)
RBC: 2.75 10*6/uL — ABNORMAL LOW (ref 4.20–5.82)
WBC: 8.7 10*3/uL (ref 4.0–10.3)
lymph#: 2.5 10*3/uL (ref 0.9–3.3)

## 2010-08-09 LAB — MORPHOLOGY: PLT EST: ADEQUATE

## 2010-08-09 LAB — IRON AND TIBC: Iron: 47 ug/dL (ref 42–165)

## 2010-08-09 LAB — COMPREHENSIVE METABOLIC PANEL
ALT: 7 U/L (ref 0–53)
Albumin: 3 g/dL — ABNORMAL LOW (ref 3.5–5.2)
CO2: 23 mEq/L (ref 19–32)
Calcium: 8.4 mg/dL (ref 8.4–10.5)
Chloride: 109 mEq/L (ref 96–112)
Sodium: 139 mEq/L (ref 135–145)
Total Protein: 6 g/dL (ref 6.0–8.3)

## 2010-08-15 ENCOUNTER — Other Ambulatory Visit: Payer: Self-pay | Admitting: Nephrology

## 2010-08-15 ENCOUNTER — Encounter (HOSPITAL_COMMUNITY)
Admission: RE | Admit: 2010-08-15 | Discharge: 2010-08-15 | Disposition: A | Discharge status: Home / Self Care | Payer: Medicare Other | Source: Ambulatory Visit | Attending: Nephrology | Admitting: Nephrology

## 2010-08-15 DIAGNOSIS — N183 Chronic kidney disease, stage 3 unspecified: Secondary | ICD-10-CM | POA: Insufficient documentation

## 2010-08-15 DIAGNOSIS — D638 Anemia in other chronic diseases classified elsewhere: Secondary | ICD-10-CM | POA: Insufficient documentation

## 2010-08-15 LAB — IRON AND TIBC
Saturation Ratios: 33 % (ref 20–55)
TIBC: 219 ug/dL (ref 215–435)
UIBC: 147 ug/dL

## 2010-08-15 LAB — CEA: CEA: 5 ng/mL (ref 0.0–5.0)

## 2010-08-15 LAB — FERRITIN: Ferritin: 928 ng/mL — ABNORMAL HIGH (ref 22–322)

## 2010-08-29 ENCOUNTER — Other Ambulatory Visit: Payer: Self-pay | Admitting: Nephrology

## 2010-08-29 ENCOUNTER — Encounter (HOSPITAL_COMMUNITY): Payer: Medicare Other | Attending: Nephrology

## 2010-08-29 DIAGNOSIS — D638 Anemia in other chronic diseases classified elsewhere: Secondary | ICD-10-CM | POA: Insufficient documentation

## 2010-08-29 DIAGNOSIS — N183 Chronic kidney disease, stage 3 unspecified: Secondary | ICD-10-CM | POA: Insufficient documentation

## 2010-09-12 ENCOUNTER — Other Ambulatory Visit: Payer: Self-pay | Admitting: Nephrology

## 2010-09-12 ENCOUNTER — Encounter (HOSPITAL_COMMUNITY): Payer: Medicare Other

## 2010-09-12 LAB — IRON AND TIBC: TIBC: 255 ug/dL (ref 215–435)

## 2010-09-12 LAB — FERRITIN: Ferritin: 623 ng/mL — ABNORMAL HIGH (ref 22–322)

## 2010-09-18 ENCOUNTER — Ambulatory Visit: Payer: Self-pay | Admitting: Gastroenterology

## 2010-09-20 NOTE — Discharge Summary (Signed)
NAME:  Brent, [name] NO.:  [id number]  MEDICAL RECORD NO.:  [id number]           PATIENT TYPE:  I  LOCATION:  4008                         FACILITY:  [facility]  PHYSICIAN:  Geraldine Solar, MD     DATE OF BIRTH:  06-22-1945  DATE OF ADMISSION:  07/10/2010 DATE OF DISCHARGE:  07/15/2010                              DISCHARGE SUMMARY   PRIMARY CARE PHYSICIAN:  Tona Sensing, MD, with [facility].  NEPHROLOGIST:  Dr. Elmarie Shiley with [facility].  GASTROENTEROLOGIST:  Milus Banister, MD, with [facility].  SURGEON:  Darene Lamer. Hoxworth, MD  CONDITION ON DISCHARGE:  [name] is alert and oriented, feeling much better.  He denies any abdominal pain.  He is eating, having appropriate bowel movements, and has requested discharge.  DISCHARGE DIAGNOSES: 1. Acute cholecystitis, status post laparoscopic cholecystectomy. 2. Crohn disease, on outpatient Remicade. 3. Chronic kidney disease. 4. Coronary artery disease. 5. Microcytic anemia. 6. Tobacco dependence. 7. Hypertension.  HISTORY OF BRIEF HOSPITAL COURSE:  [name] is an extremely pleasant 65- year-old veteran with a history of Crohn disease who has had 2 previous ileocolonic resections who presented with 2 weeks of progressive pain in his mid abdomen.  He had a CT scan in the emergency department that showed a markedly distended gallbladder.  He was admitted by [facility] and started on IV antibiotics.  A Gastroenterology consult was obtained.  They recommended that [facility] be called. [facility] consulted on the patient and recommended HIDA scan.  HIDA scan showed nonfilling gallbladder consistent with cystic duct obstruction and acute cholecystitis.  The patient subsequently underwent laparoscopic cholecystectomy with intraoperative cholangiogram on Jul 12, 2010, with question of distal duct obstruction, however, following the  patient's clinical symptoms as well as the patient's LFTs, it does not appear at this time that there is any distal common bile duct obstruction.  The patient was to have 7-day course of outpatient Cipro.  He has been on Zosyn for the past 5 days as an inpatient.  He will follow up with Dr. Excell Seltzer in 2-3 weeks.  Diagnosis of Crohn disease, the patient was seen by Gastroenterology as an inpatient.  However, they did not feel that Crohn disease is a primary cause of his abdominal pain and it appears to have remained quiet during this hospitalization.  As a matter of fact, the patient felt he had constipation and had to be given a Dulcolax suppository.  Just prior to discharge, he had a bowel movement.  He will follow up with Dr. Owens Loffler as an outpatient and to review his Crohn medications in the setting of being status post cholecystectomy.  [name] has secondary diagnoses of chronic kidney disease stage III, coronary artery disease, microcytic anemia, and hypertension that were not active during this hospitalization.   He was counseled to stop smoking and he was given a nicotine patch.  He will be given a nicotine patch prescription at the time of discharge.  PHYSICAL EXAMINATION:  GENERAL:  The patient is alert and oriented.  His demeanor is  pleasant and cooperative. VITAL SIGNS:  Temperature 100.3, pulse 95, respirations 18, blood pressure 137/82, O2 sats 94% on room air. HEENT:  Head is atraumatic, normocephalic.  Eyes are anicteric with pupils equal, round, and reactive to light.  Nose shows no nasal discharge or exterior lesions.  Mouth has moist mucous membranes with good dentition. NECK:  Supple with midline trachea.  No JVD.  No lymphadenopathy. CHEST:  There is no accessory muscle use.  There are no wheezes or crackles to my exam. HEART:  Regular rate and rhythm without obvious murmurs, rubs, or gallops. ABDOMEN:  Slightly distended, tender to palpation.   He has an incision over the central abdomen that appears to be feeling well after surgery. His bandage where his JP Keenan Bachelor drain was previously placed, was clean and dry.  The drain has been removed. EXTREMITIES:  No clubbing, cyanosis, or edema.  Peripheral pulses are intact. NEURO:  Cranial nerves II-XII are grossly intact.  He has no facial asymmetries.  No obvious focal neuro deficits. PSYCHIATRIC:  The patient is alert and oriented.  He is slightly jittery and appears anxious for discharge.  LABS DURING HIS HOSPITALIZATION STAY:  His WBC count had a high of 15.3, today at the time of discharge it is 9.4; hemoglobin today 10.2; hematocrit 31.5; MCV value 101.3; platelets 190.  Sodium 137; potassium 4.0; chloride 105; bicarb 26; glucose 92; BUN 12; creatinine 1.97, his baseline appears to be approximately at 1.2.  He had an anemia panel drawn while he was here that showed iron of 24, TIBC of 206, iron saturation is 12%, UIBC 182, vitamin B12 of 669, serum folate 17.7, serum ferritin 999.  Valproic acid 26.2.  Urinary analysis negative for signs of infection.  Radiological exams on Jul 10, 2010, the patient had a CT abdomen and pelvis that showed previous bowel resection related to Crohn's enteritis.  Thickening of the distal small bowel, proximal colon to the hepatic flexure would suggest an active Crohn's enteritis  presently. No abscess or free air.  No obstruction. He had a markedly dilated gallbladder with small amount of adjacent fluid, cholecystitis is a concern given its appearance.  The patient had a HIDA scan that showed nonfilling of gallbladder consistent with cystic duct obstruction and acute cholecystitis.  He had a patent common bile duct.  The patient has no outstanding microbiology tests or laboratory results.  DISCHARGE MEDICATIONS: 1. Nicotine 21 mg for 24-hour patch, one patch transdermally daily,     take for 7 days. 2. Vicodin hydrocodone/acetaminophen 1  tablet by mouth every 6 hours     as needed for pain.  He will be given a prescription with quantity     enough for 5 days. 3. Cipro 500 mg 1 tablet by mouth twice daily, take for 7 days. 4. AmLactin, ammonium lactate 12% topical cream one application twice     daily. 5. Amlodipine 10 mg 1 tablet daily at bedtime. 6. Calcium carbonate/vitamin D 600/400 mg 1 tablet by mouth twice     daily. 7. Colace 50 mg 1 capsule by mouth daily. 8. Vitamin B12 injections 2 mL subcutaneously monthly. 9. Divalproex 500 mg 3 tablets by mouth daily at bedtime. 10.Iron 325 mg 1 tablet by mouth twice daily. 11.Flunisolide 0.025% nasal spray one spray nasally twice daily. 12.Loperamide 2 mg 1 tablet by mouth daily as needed for diarrhea. 13.Lutein 20 mg 1 tablet by mouth daily. 14.Mag oxide 400 mg 1 tablet by mouth daily. 15.Metoprolol succinate XL 25  mg 1 tablet by mouth daily. 16.Multivitamin therapeutic 1 tablet by mouth daily. 17.Omeprazole 20 mg 1 tablet by mouth daily. 18.Oxybutynin XL 15 mg 2 tablets by mouth daily. 19.Pentasa 4 tablets by mouth 3 times a day. 20.Procrit injections one IV weekly. 21.Remicade 4 kg once every 8 weeks. 22.Vitamin E 400 mg 1 tablet by mouth daily. 23.Zyprexa 10 mg 1 tablet by mouth daily at bedtime. 24.Zyprexa 5 mg 1 tablet by mouth daily for agitation p.r.n.  DISCHARGE INSTRUCTIONS: 1. The patient is to advance his activity slowly.  His diet will be     low-fiber as it was before his hospitalization. 2. For wound care for his abdominal incision, he is able to shower.     He is to keep the area clean with gentle soap and water.  If he     sees any signs of redness, drainage, or infection.  He is to call     Dr. Excell Seltzer at [phone].  FOLLOWUP APPOINTMENTS:  He is to see Dr. Excell Seltzer in 2 weeks at [phone].     Melton Alar, PA   ______________________________ Geraldine Solar, MD    MLY/MEDQ  D:  07/15/2010  T:  07/16/2010  Job:   [id number]  Electronically Signed by Imogene Burn PA on 08/01/2010 08:51:37 PM Electronically Signed by Shellee Milo MD on 09/20/2010 03:02:48 PM

## 2010-09-23 ENCOUNTER — Encounter: Payer: Self-pay | Admitting: Gastroenterology

## 2010-09-23 ENCOUNTER — Ambulatory Visit (INDEPENDENT_AMBULATORY_CARE_PROVIDER_SITE_OTHER): Payer: Medicare Other | Admitting: Gastroenterology

## 2010-09-23 VITALS — BP 120/64 | HR 88 | Ht 76.0 in | Wt 159.0 lb

## 2010-09-23 DIAGNOSIS — K5090 Crohn's disease, unspecified, without complications: Secondary | ICD-10-CM

## 2010-09-23 NOTE — Progress Notes (Signed)
Review of gastrointestinal problems:  1. crohn's diagnosed in 1970. He had abd surgeries (one in 70's, one in 80's). He believes only ileum was removed.Colonoscopy with Dr. Lajoyce Corners in mid 2008: this showed a wide-open IC valve, biopsies of ileum and colon showed chronic, active colitis however macroscopically he described it as a fairly normal-appearing. Colonoscopy Dr. Ardis Hughs 12/11 found no colitis, but actively ulcerated and inflamed neoterminal ileum (while he was on prednisone). 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  July, 2012: Doing very well on maintenance Remicade, cholestyramine. January 2012 TB skin testing was negative  Remicade started February 2012 at 5 mg per kilogram  Cholestyramine started March, 2012 2. acute cholecystitis May 2012. Laparoscopic cholecystectomy   HPI: This is a very pleasant 65 year old man whom I last saw  About 5-6 weeks ago.   Back on remicade (took break around time of lap chole).  Last infusion was Mid June,  Next is in [facility] august.  Bowels are good.  No signficant diarrhea, no bleedding.  He stopped smoking 3-4 weeks ago, friend diagnosed with lung.   He has one bm every day or two.   Past Medical History:   Crohn's                                                      Anxiety                                                      Depression                                                   Kidney problem                                              Past Surgical History:   SMALL INTESTINE SURGERY                                        Comment:x 2   CHOLECYSTECTOMY                                 07-12-2010    reports that he has quit smoking. His smoking use included Cigarettes. He has a 24.5 pack-year smoking history. He has never used smokeless tobacco. He reports that he does not drink alcohol or use illicit drugs.  family history includes Diabetes in his mother; Emphysema in his father; and Pneumonia in his maternal  grandmother.  There is no history of Colon cancer.    Current medicines and allergies were reviewed in [place] [facility]    Physical Exam: BP 120/64  Pulse 88  Ht 6' 4"  (1.93 m)  Wt 159 lb (72.122 kg)  BMI 19.35 kg/m2 Constitutional: generally  well-appearing Psychiatric: alert and oriented x3 Abdomen: soft, nontender, nondistended, no obvious ascites, no peritoneal signs, normal bowel sounds     Assessment and plan: 65 y.o. male with  Crohn's disease, under good control  He'll continue on maintenance Remicade, daily cholestyramine. He will return to see me in 3-4 weeks and sooner if needed.

## 2010-09-23 NOTE — Patient Instructions (Signed)
Continue the remicade every 8 weeks.  Continue the cholestyramine 0-1 times per day (can hold off if constipated). Return to see Dr. Ardis Hughs in 3-4 months.

## 2010-09-26 ENCOUNTER — Encounter (HOSPITAL_COMMUNITY): Payer: Medicare Other

## 2010-10-02 ENCOUNTER — Other Ambulatory Visit: Payer: Self-pay | Admitting: Nephrology

## 2010-10-02 ENCOUNTER — Encounter (HOSPITAL_COMMUNITY): Payer: Medicare Other | Attending: Nephrology

## 2010-10-02 DIAGNOSIS — D638 Anemia in other chronic diseases classified elsewhere: Secondary | ICD-10-CM | POA: Insufficient documentation

## 2010-10-02 DIAGNOSIS — N183 Chronic kidney disease, stage 3 unspecified: Secondary | ICD-10-CM | POA: Insufficient documentation

## 2010-10-02 LAB — POCT HEMOGLOBIN-HEMACUE: Hemoglobin: 11.3 g/dL — ABNORMAL LOW (ref 13.0–17.0)

## 2010-10-03 ENCOUNTER — Encounter (HOSPITAL_COMMUNITY): Payer: Medicare Other | Attending: Gastroenterology

## 2010-10-03 DIAGNOSIS — F341 Dysthymic disorder: Secondary | ICD-10-CM | POA: Insufficient documentation

## 2010-10-03 DIAGNOSIS — Z79899 Other long term (current) drug therapy: Secondary | ICD-10-CM | POA: Insufficient documentation

## 2010-10-03 DIAGNOSIS — K5080 Crohn's disease of both small and large intestine without complications: Secondary | ICD-10-CM | POA: Insufficient documentation

## 2010-10-07 ENCOUNTER — Encounter (HOSPITAL_BASED_OUTPATIENT_CLINIC_OR_DEPARTMENT_OTHER): Payer: Medicare Other | Admitting: Oncology

## 2010-10-07 ENCOUNTER — Other Ambulatory Visit: Payer: Self-pay | Admitting: Oncology

## 2010-10-07 DIAGNOSIS — D649 Anemia, unspecified: Secondary | ICD-10-CM

## 2010-10-07 DIAGNOSIS — K5090 Crohn's disease, unspecified, without complications: Secondary | ICD-10-CM

## 2010-10-07 DIAGNOSIS — N289 Disorder of kidney and ureter, unspecified: Secondary | ICD-10-CM

## 2010-10-07 LAB — CBC WITH DIFFERENTIAL/PLATELET
Basophils Absolute: 0 10*3/uL (ref 0.0–0.1)
EOS%: 2 % (ref 0.0–7.0)
HGB: 10.7 g/dL — ABNORMAL LOW (ref 13.0–17.1)
MCH: 34.2 pg — ABNORMAL HIGH (ref 27.2–33.4)
NEUT#: 4.5 10*3/uL (ref 1.5–6.5)
RDW: 13.5 % (ref 11.0–14.6)
lymph#: 2.7 10*3/uL (ref 0.9–3.3)

## 2010-10-14 ENCOUNTER — Other Ambulatory Visit: Payer: Self-pay | Admitting: Diagnostic Neuroimaging

## 2010-10-14 DIAGNOSIS — R42 Dizziness and giddiness: Secondary | ICD-10-CM

## 2010-10-16 ENCOUNTER — Other Ambulatory Visit: Payer: Self-pay | Admitting: Nephrology

## 2010-10-16 ENCOUNTER — Encounter (HOSPITAL_COMMUNITY): Payer: Medicare Other

## 2010-10-16 DIAGNOSIS — D638 Anemia in other chronic diseases classified elsewhere: Secondary | ICD-10-CM | POA: Insufficient documentation

## 2010-10-16 DIAGNOSIS — N183 Chronic kidney disease, stage 3 unspecified: Secondary | ICD-10-CM | POA: Insufficient documentation

## 2010-10-16 LAB — FERRITIN: Ferritin: 747 ng/mL — ABNORMAL HIGH (ref 22–322)

## 2010-10-16 LAB — IRON AND TIBC
Iron: 97 ug/dL (ref 42–135)
TIBC: 244 ug/dL (ref 215–435)

## 2010-10-21 ENCOUNTER — Ambulatory Visit
Admission: RE | Admit: 2010-10-21 | Discharge: 2010-10-21 | Disposition: A | Discharge status: Home / Self Care | Payer: Medicare Other | Source: Ambulatory Visit | Attending: Diagnostic Neuroimaging | Admitting: Diagnostic Neuroimaging

## 2010-10-21 DIAGNOSIS — R42 Dizziness and giddiness: Secondary | ICD-10-CM

## 2010-10-21 MED ORDER — GADOBENATE DIMEGLUMINE 529 MG/ML IV SOLN
14.0000 mL | Freq: Once | INTRAVENOUS | Status: AC | PRN
  Administered 2010-10-21: 14 mL via INTRAVENOUS

## 2010-10-29 ENCOUNTER — Other Ambulatory Visit: Payer: Self-pay | Admitting: Gastroenterology

## 2010-10-30 ENCOUNTER — Other Ambulatory Visit: Payer: Self-pay | Admitting: Nephrology

## 2010-10-30 ENCOUNTER — Encounter (HOSPITAL_COMMUNITY): Payer: Medicare Other | Attending: Nephrology

## 2010-10-30 DIAGNOSIS — D638 Anemia in other chronic diseases classified elsewhere: Secondary | ICD-10-CM | POA: Insufficient documentation

## 2010-10-30 DIAGNOSIS — N183 Chronic kidney disease, stage 3 unspecified: Secondary | ICD-10-CM | POA: Insufficient documentation

## 2010-10-30 LAB — POCT HEMOGLOBIN-HEMACUE: Hemoglobin: 11.2 g/dL — ABNORMAL LOW (ref 13.0–17.0)

## 2010-11-13 ENCOUNTER — Encounter (HOSPITAL_COMMUNITY): Payer: Medicare Other

## 2010-11-15 ENCOUNTER — Encounter (HOSPITAL_COMMUNITY): Payer: Medicare Other

## 2010-11-20 ENCOUNTER — Other Ambulatory Visit: Payer: Self-pay | Admitting: Nephrology

## 2010-11-20 ENCOUNTER — Encounter (HOSPITAL_COMMUNITY)
Admission: RE | Admit: 2010-11-20 | Discharge: 2010-11-20 | Payer: Medicare Other | Source: Ambulatory Visit | Attending: Nephrology | Admitting: Nephrology

## 2010-11-20 LAB — IRON AND TIBC
Iron: 86 ug/dL (ref 42–135)
Saturation Ratios: 40 % (ref 20–55)
TIBC: 215 ug/dL (ref 215–435)
UIBC: 129 ug/dL (ref 125–400)

## 2010-11-20 LAB — POCT HEMOGLOBIN-HEMACUE: Hemoglobin: 11.8 g/dL — ABNORMAL LOW (ref 13.0–17.0)

## 2010-11-22 ENCOUNTER — Other Ambulatory Visit: Payer: Self-pay

## 2010-11-22 DIAGNOSIS — K5090 Crohn's disease, unspecified, without complications: Secondary | ICD-10-CM

## 2010-11-22 MED ORDER — INFLIXIMAB 100 MG IV SOLR: INTRAVENOUS | Status: AC

## 2010-11-28 ENCOUNTER — Encounter (HOSPITAL_COMMUNITY)
Admission: RE | Admit: 2010-11-28 | Discharge: 2010-11-28 | Disposition: A | Discharge status: Home / Self Care | Payer: Medicare Other | Source: Ambulatory Visit | Attending: Gastroenterology | Admitting: Gastroenterology

## 2010-11-28 DIAGNOSIS — F341 Dysthymic disorder: Secondary | ICD-10-CM | POA: Insufficient documentation

## 2010-11-28 DIAGNOSIS — K5080 Crohn's disease of both small and large intestine without complications: Secondary | ICD-10-CM | POA: Insufficient documentation

## 2010-11-28 DIAGNOSIS — Z79899 Other long term (current) drug therapy: Secondary | ICD-10-CM | POA: Insufficient documentation

## 2010-12-04 ENCOUNTER — Other Ambulatory Visit: Payer: Self-pay | Admitting: Nephrology

## 2010-12-04 ENCOUNTER — Encounter (HOSPITAL_COMMUNITY)
Admission: RE | Admit: 2010-12-04 | Discharge: 2010-12-04 | Disposition: A | Discharge status: Home / Self Care | Payer: Medicare Other | Source: Ambulatory Visit | Attending: Nephrology | Admitting: Nephrology

## 2010-12-04 DIAGNOSIS — N183 Chronic kidney disease, stage 3 unspecified: Secondary | ICD-10-CM | POA: Insufficient documentation

## 2010-12-04 DIAGNOSIS — D638 Anemia in other chronic diseases classified elsewhere: Secondary | ICD-10-CM | POA: Insufficient documentation

## 2010-12-09 ENCOUNTER — Encounter (HOSPITAL_BASED_OUTPATIENT_CLINIC_OR_DEPARTMENT_OTHER): Payer: Medicare Other | Admitting: Oncology

## 2010-12-09 ENCOUNTER — Other Ambulatory Visit: Payer: Self-pay | Admitting: Oncology

## 2010-12-09 DIAGNOSIS — N289 Disorder of kidney and ureter, unspecified: Secondary | ICD-10-CM

## 2010-12-09 DIAGNOSIS — K5090 Crohn's disease, unspecified, without complications: Secondary | ICD-10-CM

## 2010-12-09 DIAGNOSIS — D649 Anemia, unspecified: Secondary | ICD-10-CM

## 2010-12-09 LAB — CBC WITH DIFFERENTIAL/PLATELET
BASO%: 0.3 % (ref 0.0–2.0)
Basophils Absolute: 0 10e3/uL (ref 0.0–0.1)
EOS%: 2.9 % (ref 0.0–7.0)
Eosinophils Absolute: 0.3 10e3/uL (ref 0.0–0.5)
HCT: 32 % — ABNORMAL LOW (ref 38.4–49.9)
HGB: 10.6 g/dL — ABNORMAL LOW (ref 13.0–17.1)
LYMPH%: 33.2 % (ref 14.0–49.0)
MCH: 33.9 pg — ABNORMAL HIGH (ref 27.2–33.4)
MCHC: 33 g/dL (ref 32.0–36.0)
MCV: 102.5 fL — ABNORMAL HIGH (ref 79.3–98.0)
MONO#: 0.6 10e3/uL (ref 0.1–0.9)
MONO%: 7.1 % (ref 0.0–14.0)
NEUT#: 5 10e3/uL (ref 1.5–6.5)
NEUT%: 56.5 % (ref 39.0–75.0)
Platelets: 141 10e3/uL (ref 140–400)
RBC: 3.12 10e6/uL — ABNORMAL LOW (ref 4.20–5.82)
RDW: 13.9 % (ref 11.0–14.6)
WBC: 8.9 10e3/uL (ref 4.0–10.3)
lymph#: 2.9 10e3/uL (ref 0.9–3.3)

## 2010-12-18 ENCOUNTER — Other Ambulatory Visit: Payer: Self-pay | Admitting: Nephrology

## 2010-12-18 ENCOUNTER — Encounter (HOSPITAL_COMMUNITY): Payer: Medicare Other

## 2010-12-18 LAB — IRON AND TIBC: Saturation Ratios: 49 % (ref 20–55)

## 2010-12-27 ENCOUNTER — Other Ambulatory Visit (HOSPITAL_COMMUNITY): Payer: Self-pay | Admitting: *Deleted

## 2010-12-27 ENCOUNTER — Telehealth: Payer: Self-pay

## 2010-12-27 NOTE — Telephone Encounter (Signed)
Message copied by Barron Alvine on Fri Dec 27, 2010  8:24 AM ------      Message from: Barron Alvine      Created: Tue Nov 26, 2010  9:18 AM       Caller: Marcie Bal @ Short Stay @ [facility] [phone]      Call For: Dr. Ardis Hughs      Summary of Call: Calling about orders for Remicade Infusion on 05-14-10      Initial call taken by: Webb Laws,  May 10, 2010 12:31 PM            Follow-up for Phone Call              Marcie Bal questioned the date the TB skin test was done, I reviewed the chart and the test was given 01/24/10 at the [facility] clinic. Marcie Bal is aware.      Follow-up by: Christian Mate CMA Deborra Medina),  May 10, 2010 12:50 PM                         ----- Message -----         From: Christian Mate, [place]: 11/25/2010           To: Christian Mate, CMA            Needs PPD 12/2010

## 2010-12-27 NOTE — Telephone Encounter (Signed)
Pt scheduled for 12/30/10 to have annual PPD  He is aware

## 2010-12-30 ENCOUNTER — Ambulatory Visit (INDEPENDENT_AMBULATORY_CARE_PROVIDER_SITE_OTHER): Payer: Medicare Other | Admitting: Gastroenterology

## 2010-12-30 DIAGNOSIS — K5090 Crohn's disease, unspecified, without complications: Secondary | ICD-10-CM

## 2010-12-30 NOTE — Progress Notes (Signed)
PPD placed

## 2011-01-01 ENCOUNTER — Encounter (HOSPITAL_COMMUNITY)
Admission: RE | Admit: 2011-01-01 | Discharge: 2011-01-01 | Disposition: A | Discharge status: Home / Self Care | Payer: Medicare Other | Source: Ambulatory Visit | Attending: Nephrology | Admitting: Nephrology

## 2011-01-01 DIAGNOSIS — N183 Chronic kidney disease, stage 3 unspecified: Secondary | ICD-10-CM | POA: Insufficient documentation

## 2011-01-01 DIAGNOSIS — D638 Anemia in other chronic diseases classified elsewhere: Secondary | ICD-10-CM | POA: Insufficient documentation

## 2011-01-01 LAB — POCT HEMOGLOBIN-HEMACUE: Hemoglobin: 10.3 g/dL — ABNORMAL LOW (ref 13.0–17.0)

## 2011-01-01 LAB — TB SKIN TEST: TB Skin Test: NEGATIVE mm

## 2011-01-01 MED ORDER — EPOETIN ALFA 10000 UNIT/ML IJ SOLN
10000.0000 [IU] | INTRAMUSCULAR | Status: DC
  Filled 2011-01-01: qty 1

## 2011-01-11 ENCOUNTER — Telehealth: Payer: Self-pay | Admitting: Oncology

## 2011-01-11 NOTE — Telephone Encounter (Signed)
Mailed the pt his feb,april,june 2013 appt calendars.

## 2011-01-15 ENCOUNTER — Encounter (HOSPITAL_COMMUNITY): Payer: Medicare Other

## 2011-01-22 ENCOUNTER — Other Ambulatory Visit (HOSPITAL_COMMUNITY): Payer: Self-pay | Admitting: *Deleted

## 2011-01-22 ENCOUNTER — Encounter (HOSPITAL_COMMUNITY)
Admission: RE | Admit: 2011-01-22 | Discharge: 2011-01-22 | Disposition: A | Discharge status: Home / Self Care | Payer: Medicare Other | Source: Ambulatory Visit | Attending: Nephrology | Admitting: Nephrology

## 2011-01-22 ENCOUNTER — Other Ambulatory Visit: Payer: Self-pay | Admitting: Gastroenterology

## 2011-01-22 ENCOUNTER — Encounter: Payer: Self-pay | Admitting: *Deleted

## 2011-01-22 DIAGNOSIS — K5090 Crohn's disease, unspecified, without complications: Secondary | ICD-10-CM

## 2011-01-22 LAB — IRON AND TIBC: TIBC: 201 ug/dL — ABNORMAL LOW (ref 215–435)

## 2011-01-22 MED ORDER — EPOETIN ALFA 10000 UNIT/ML IJ SOLN
10000.0000 [IU] | INTRAMUSCULAR | Status: DC
  Administered 2011-01-22: 10000 [IU] via SUBCUTANEOUS

## 2011-01-22 MED ORDER — EPOETIN ALFA 10000 UNIT/ML IJ SOLN
INTRAMUSCULAR | Status: AC
  Administered 2011-01-22: 10000 [IU] via SUBCUTANEOUS
  Filled 2011-01-22: qty 1

## 2011-01-22 NOTE — Telephone Encounter (Deleted)
Dr Ardis Hughs Brent [name] walked in today with a form to be filled out to be excused from jury duty because of his crohns.  I sent him to medical records to have this done, Colletta Maryland from medical records called and said that he only needs a note from you stating that he can not be on jury duty.  Can we write this letter?

## 2011-01-22 NOTE — Telephone Encounter (Signed)
Wrong patient

## 2011-01-23 ENCOUNTER — Encounter (HOSPITAL_COMMUNITY)
Admission: RE | Admit: 2011-01-23 | Discharge: 2011-01-23 | Disposition: A | Discharge status: Home / Self Care | Payer: Medicare Other | Source: Ambulatory Visit | Attending: Gastroenterology | Admitting: Gastroenterology

## 2011-01-23 ENCOUNTER — Encounter (HOSPITAL_COMMUNITY): Payer: Self-pay

## 2011-01-23 DIAGNOSIS — K5090 Crohn's disease, unspecified, without complications: Secondary | ICD-10-CM | POA: Insufficient documentation

## 2011-01-23 LAB — POCT HEMOGLOBIN-HEMACUE: Hemoglobin: 10.6 g/dL — ABNORMAL LOW (ref 13.0–17.0)

## 2011-01-23 MED ORDER — DIPHENHYDRAMINE HCL 25 MG PO TABS
25.0000 mg | ORAL_TABLET | Freq: Once | ORAL | Status: AC
  Administered 2011-01-23: 25 mg via ORAL
  Filled 2011-01-23: qty 1

## 2011-01-23 MED ORDER — INFLIXIMAB 100 MG IV SOLR
5.0000 mg/kg | INTRAVENOUS | Status: DC
  Administered 2011-01-23: 400 mg via INTRAVENOUS
  Filled 2011-01-23: qty 40

## 2011-01-23 MED ORDER — ACETAMINOPHEN 650 MG PO TABS: 650.0000 mg | ORAL_TABLET | Freq: Once | ORAL | Status: DC

## 2011-01-23 MED ORDER — SODIUM CHLORIDE 0.9 % IV SOLN
INTRAVENOUS | Status: DC
  Administered 2011-01-23: 250 mL via INTRAVENOUS

## 2011-01-23 MED ORDER — ACETAMINOPHEN 325 MG PO TABS
650.0000 mg | ORAL_TABLET | Freq: Once | ORAL | Status: AC
  Administered 2011-01-23: 650 mg via ORAL

## 2011-01-23 MED ORDER — DIPHENHYDRAMINE HCL 25 MG PO CAPS: 25.0000 mg | ORAL_CAPSULE | Freq: Once | ORAL | Status: DC

## 2011-01-27 ENCOUNTER — Encounter: Payer: Self-pay | Admitting: Oncology

## 2011-01-29 ENCOUNTER — Encounter (HOSPITAL_COMMUNITY): Payer: Medicare Other

## 2011-02-03 ENCOUNTER — Other Ambulatory Visit: Payer: Self-pay | Admitting: Oncology

## 2011-02-03 ENCOUNTER — Telehealth: Payer: Self-pay | Admitting: Oncology

## 2011-02-03 ENCOUNTER — Ambulatory Visit (HOSPITAL_BASED_OUTPATIENT_CLINIC_OR_DEPARTMENT_OTHER): Payer: Medicare Other | Admitting: Oncology

## 2011-02-03 ENCOUNTER — Other Ambulatory Visit (HOSPITAL_BASED_OUTPATIENT_CLINIC_OR_DEPARTMENT_OTHER): Payer: Medicare Other | Admitting: Lab

## 2011-02-03 DIAGNOSIS — N289 Disorder of kidney and ureter, unspecified: Secondary | ICD-10-CM

## 2011-02-03 DIAGNOSIS — K5090 Crohn's disease, unspecified, without complications: Secondary | ICD-10-CM

## 2011-02-03 DIAGNOSIS — F17200 Nicotine dependence, unspecified, uncomplicated: Secondary | ICD-10-CM

## 2011-02-03 DIAGNOSIS — D649 Anemia, unspecified: Secondary | ICD-10-CM

## 2011-02-03 LAB — CBC WITH DIFFERENTIAL/PLATELET
BASO%: 0.4 % (ref 0.0–2.0)
Eosinophils Absolute: 0.2 10*3/uL (ref 0.0–0.5)
HCT: 34.4 % — ABNORMAL LOW (ref 38.4–49.9)
HGB: 11.6 g/dL — ABNORMAL LOW (ref 13.0–17.1)
LYMPH%: 19.4 % (ref 14.0–49.0)
MCH: 34.9 pg — ABNORMAL HIGH (ref 27.2–33.4)
MCHC: 33.7 g/dL (ref 32.0–36.0)
MONO%: 5.9 % (ref 0.0–14.0)
NEUT#: 7 10*3/uL — ABNORMAL HIGH (ref 1.5–6.5)
NEUT%: 72.7 % (ref 39.0–75.0)
RBC: 3.32 10*6/uL — ABNORMAL LOW (ref 4.20–5.82)
WBC: 9.7 10*3/uL (ref 4.0–10.3)

## 2011-02-03 NOTE — Telephone Encounter (Signed)
gve the pt his march,june,sept 2013 appts

## 2011-02-03 NOTE — Progress Notes (Signed)
[facility] OFFICE PROGRESS NOTE  REED,TIFFANY L., DO  DIAGNOSIS:  Normocytic anemia from chronic inflammation (Crohn's) and renal insufficiency.  CURRENT THERAPY:  watchful observation; transfusion prn; Procrit injection by Dr. Elmarie Shiley.  INTERVAL HISTORY: Brent [name] 65 y.o. male returns for regular follow up by himself.  He said that he has been feeling a little depressed lately since his friend of 77 years has recently passed away from metastatic lung cancer.  He has resumed smoking again about 1/2 pack a day.  He denies any suicidal/homocidal ideation.  However, he recently went on a shopping spree.  He recognized this problem and has stopped.  He is adherent to Depakote.  He has been having mild fatigue; however, he is still independent of all activities of daily living.  He has intermittent mild diarrhea from his Crohn; but nothing severe.     Patient denies headache, visual changes, confusion, drenching night sweats, palpable lymph node swelling, mucositis, odynophagia, dysphagia, nausea vomiting, jaundice, chest pain, palpitation, shortness of breath, dyspnea on exertion, productive cough, gum bleeding, epistaxis, hematemesis, hemoptysis, abdominal pain, abdominal swelling, early satiety, melena, hematochezia, hematuria, skin rash, spontaneous bleeding, joint swelling, joint pain, heat or cold intolerance, bowel bladder incontinence, back pain, focal motor weakness, paresthesia, depression, suicidal or homocidal ideation, feeling hopelessness.   MEDICAL HISTORY: Past Medical History  Diagnosis Date  . Crohn's   . Anxiety   . Depression   . Renal insufficiency   . Anemia     SURGICAL HISTORY:  Past Surgical History  Procedure Date  . Small intestine surgery     x 2  . Cholecystectomy 07-12-2010    MEDICATIONS: Current Outpatient Prescriptions  Medication Sig Dispense Refill  . Acetaminophen 650 MG TABS Take 650 mg by mouth once. Prior to infusion  1 tablet   0  . amLODipine (NORVASC) 10 MG tablet Take 10 mg by mouth Nightly.        Marland Kitchen ammonium lactate (AMLACTIN) 12 % lotion Apply 1 application topically as needed.        . Calcium Carbonate-Vit D-Min 600-400 MG-UNIT TABS Take 1 tablet by mouth 2 (two) times daily.        . cholestyramine (QUESTRAN) 4 GM/DOSE powder take 1 scoopful DISSOLVED IN WATER once daily  378 g  3  . diphenhydrAMINE (BENADRYL) 25 mg capsule Take 1-2 capsules (25-50 mg total) by mouth once. Prior to infusion  2 capsule  0  . divalproex (DEPAKOTE) 500 MG EC tablet Take 1,500 mg by mouth daily.       Marland Kitchen epoetin alfa (PROCRIT) 45038 UNIT/ML injection Inject 10,000 Units into the skin 2 (two) times a week.       . ferrous sulfate 325 (65 FE) MG tablet Take 325 mg by mouth 2 (two) times daily.        . flunisolide (NASALIDE) 0.025 % SOLN Inhale 1 spray into the lungs 2 (two) times daily. Each nostril       . inFLIXimab (REMICADE) 100 MG injection Infuse Remicade IV schedule 1 42m/kg every 8 weeks Premedicate with Tylenol 500-6544mby mouth and Benadryl 25-5063my mouth prior to infusion. Last PPD was on 12/2009.   1 each  6  . Loperamide HCl (LOPERAMIDE A-D PO) Take 1 capsule by mouth as needed.        . Lutein 20 MG TABS Take 1 tablet by mouth daily.        . magnesium oxide (MAG-OX) 400 MG tablet  Take 400 mg by mouth daily.        . metoprolol succinate (TOPROL-XL) 25 MG 24 hr tablet Take 25 mg by mouth daily.        . Multiple Vitamin (MULTIVITAMIN) tablet Take 1 tablet by mouth daily.        Marland Kitchen OLANZapine (ZYPREXA) 10 MG tablet Take 10 mg by mouth at bedtime.        Marland Kitchen omeprazole (PRILOSEC) 20 MG capsule Take 20 mg by mouth daily.        Marland Kitchen oxybutynin (DITROPAN XL) 15 MG 24 hr tablet Take 15 mg by mouth daily.        . vitamin E 400 UNIT capsule Take 400 Units by mouth daily.          ALLERGIES:  is allergic to azathioprine.  REVIEW OF SYSTEMS:  The rest of the 14-point review of system was negative.   Filed Vitals:    02/03/11 1451  BP: 133/80  Pulse: 97  Temp: 98.3 F (36.8 C)   Wt Readings from Last 3 Encounters:  02/03/11 160 lb 11.2 oz (72.893 kg)  01/23/11 162 lb (73.483 kg)  08/09/10 162 lb (73.483 kg)   ECOG Performance status: 1  PHYSICAL EXAMINATION:   General:  Thin-appearing man in no acute distress.  Eyes:  no scleral icterus.  ENT:  There were no oropharyngeal lesions.  Neck was without thyromegaly.  Lymphatics:  Negative cervical, supraclavicular or axillary adenopathy.  Respiratory: lungs were clear bilaterally without wheezing or crackles.  Cardiovascular:  Regular rate and rhythm, S1/S2, without murmur, rub or gallop.  There was no pedal edema.  GI:  abdomen was soft, flat, nontender, nondistended, without organomegaly.  Muscoloskeletal:  no spinal tenderness of palpation of vertebral spine.  Skin exam was without echymosis, petichae.  Neuro exam was nonfocal.  Patient was able to get on and off exam table without assistance.  Gait was normal.  Patient was alerted and oriented.  Attention was good.   Language was appropriate.  Mood was normal without depression.  Speech was not pressured.  Thought content was not tangential.     LABORATORY/RADIOLOGY DATA:  Lab Results  Component Value Date   WBC 9.7 02/03/2011   HGB 11.6* 02/03/2011   HCT 34.4* 02/03/2011   PLT 163 02/03/2011   GLUCOSE 97 08/09/2010   ALT 7 08/09/2010   AST 12 08/09/2010   NA 139 08/09/2010   K 3.8 08/09/2010   CL 109 08/09/2010   CREATININE 1.84* 08/09/2010   BUN 21 08/09/2010   CO2 23 08/09/2010   TSH 0.539 07/11/2010    ASSESSMENT AND PLAN:   1. Chronic anemia.  This is mostly likely anemia of chronic inflammation from his Crohn's disease and anemia of chronic renal insufficiency. He has been on Procrit by Dr. Graylon Gunning and would like to continue will continue that with Dr. Posey Pronto.  In the future, if he becomes refractory to Procrit or develops pancytopenia, then I may consider diagnostic bone marrow biopsy.   2. Renal insufficiency most likely secondary to chronic hypertension.  He is followed by Dr. Graylon Gunning.  3. History of Crohn's.  He is on infliximab, omeprazole and prednisone per GI physician.   4. Bipolar disease.  He is on valproic acid.  5. Smoking:  I advised patients off the risk of smoking which include limited to cancer, stroke, heart attack. He would like some help to stop smoking. I advised him to take nicotine patch  14 mg daily.  I warned him that smoking on nicotine patch can result in heart attack. I normally would consider starting patients also on Wellbutrin. However given his history of bipolar disease and him being on Depakote.  I deferred Wellbutrin for now.  6. Follow up with CBC only at the Cancer in 3 and 6 months.  I'll see him in 9 months.

## 2011-02-05 ENCOUNTER — Encounter (HOSPITAL_COMMUNITY): Payer: Medicare Other

## 2011-02-05 LAB — PROTEIN ELECTROPHORESIS, SERUM
Alpha-2-Globulin: 7.5 % (ref 7.1–11.8)
Gamma Globulin: 20 % — ABNORMAL HIGH (ref 11.1–18.8)
Total Protein, Serum Electrophoresis: 6.7 g/dL (ref 6.0–8.3)

## 2011-02-05 LAB — COMPREHENSIVE METABOLIC PANEL
Alkaline Phosphatase: 75 U/L (ref 39–117)
Creatinine, Ser: 1.97 mg/dL — ABNORMAL HIGH (ref 0.50–1.35)
Glucose, Bld: 73 mg/dL (ref 70–99)
Sodium: 142 mEq/L (ref 135–145)
Total Bilirubin: 0.4 mg/dL (ref 0.3–1.2)
Total Protein: 6.7 g/dL (ref 6.0–8.3)

## 2011-02-05 LAB — IRON AND TIBC
%SAT: 28 % (ref 20–55)
UIBC: 177 ug/dL (ref 125–400)

## 2011-02-05 LAB — FERRITIN: Ferritin: 632 ng/mL — ABNORMAL HIGH (ref 22–322)

## 2011-02-06 ENCOUNTER — Encounter (HOSPITAL_COMMUNITY)
Admission: RE | Admit: 2011-02-06 | Discharge: 2011-02-06 | Disposition: A | Discharge status: Home / Self Care | Payer: Medicare Other | Source: Ambulatory Visit | Attending: Nephrology | Admitting: Nephrology

## 2011-02-06 DIAGNOSIS — N183 Chronic kidney disease, stage 3 unspecified: Secondary | ICD-10-CM | POA: Insufficient documentation

## 2011-02-06 DIAGNOSIS — D638 Anemia in other chronic diseases classified elsewhere: Secondary | ICD-10-CM | POA: Insufficient documentation

## 2011-02-06 MED ORDER — EPOETIN ALFA 10000 UNIT/ML IJ SOLN
INTRAMUSCULAR | Status: AC
  Administered 2011-02-06: 14:00:00 via SUBCUTANEOUS
  Filled 2011-02-06: qty 1

## 2011-02-06 MED ORDER — EPOETIN ALFA 10000 UNIT/ML IJ SOLN: 10000.0000 [IU] | INTRAMUSCULAR | Status: DC

## 2011-02-07 LAB — POCT HEMOGLOBIN-HEMACUE: Hemoglobin: 11 g/dL — ABNORMAL LOW (ref 13.0–17.0)

## 2011-02-20 ENCOUNTER — Encounter (HOSPITAL_COMMUNITY)
Admission: RE | Admit: 2011-02-20 | Discharge: 2011-02-20 | Disposition: A | Discharge status: Home / Self Care | Payer: Medicare Other | Source: Ambulatory Visit | Attending: Nephrology | Admitting: Nephrology

## 2011-02-20 LAB — IRON AND TIBC
Iron: 96 ug/dL (ref 42–135)
Saturation Ratios: 41 % (ref 20–55)
UIBC: 136 ug/dL (ref 125–400)

## 2011-02-20 LAB — FERRITIN: Ferritin: 722 ng/mL — ABNORMAL HIGH (ref 22–322)

## 2011-02-20 MED ORDER — EPOETIN ALFA 10000 UNIT/ML IJ SOLN: 10000.0000 [IU] | INTRAMUSCULAR | Status: DC

## 2011-02-28 ENCOUNTER — Telehealth: Payer: Self-pay | Admitting: Nutrition

## 2011-02-28 NOTE — Telephone Encounter (Signed)
Called patient secondary to patient meeting positive screen for malnutrition risk with poor appetite and weight loss.  Patient reports he had lost his appetite because his long time friend had recently passed away.  He states he is now eating better and thinks he is back at his Usual body weight of approximately 163#.  I left my name and phone number in case patient has questions or concerns in the future.  Patient appreciative of phone call.

## 2011-03-03 ENCOUNTER — Other Ambulatory Visit (HOSPITAL_COMMUNITY): Payer: Self-pay | Admitting: *Deleted

## 2011-03-04 NOTE — Progress Notes (Signed)
Received office notes and labs from [facility]; forwarded to Dr. Lamonte Sakai.

## 2011-03-06 ENCOUNTER — Encounter (HOSPITAL_COMMUNITY)
Admission: RE | Admit: 2011-03-06 | Discharge: 2011-03-06 | Disposition: A | Discharge status: Home / Self Care | Payer: Medicare Other | Source: Ambulatory Visit | Attending: Nephrology | Admitting: Nephrology

## 2011-03-06 DIAGNOSIS — K5090 Crohn's disease, unspecified, without complications: Secondary | ICD-10-CM | POA: Insufficient documentation

## 2011-03-06 LAB — BASIC METABOLIC PANEL
BUN: 26 mg/dL — ABNORMAL HIGH (ref 6–23)
CO2: 20 mEq/L (ref 19–32)
Calcium: 8.9 mg/dL (ref 8.4–10.5)
Chloride: 108 mEq/L (ref 96–112)
Creatinine, Ser: 1.9 mg/dL — ABNORMAL HIGH (ref 0.50–1.35)
GFR calc Af Amer: 41 mL/min — ABNORMAL LOW (ref >90)
GFR calc non Af Amer: 35 mL/min — ABNORMAL LOW (ref >90)
Glucose, Bld: 89 mg/dL (ref 70–99)
Potassium: 4.1 mEq/L (ref 3.5–5.1)
Sodium: 138 mEq/L (ref 135–145)

## 2011-03-06 LAB — POCT HEMOGLOBIN-HEMACUE: Hemoglobin: 11.3 g/dL — ABNORMAL LOW (ref 13.0–17.0)

## 2011-03-06 LAB — MAGNESIUM: Magnesium: 1.5 mg/dL (ref 1.5–2.5)

## 2011-03-06 MED ORDER — EPOETIN ALFA 10000 UNIT/ML IJ SOLN
10000.0000 [IU] | INTRAMUSCULAR | Status: DC
  Administered 2011-03-06: 10000 [IU] via SUBCUTANEOUS

## 2011-03-06 MED ORDER — EPOETIN ALFA 10000 UNIT/ML IJ SOLN
INTRAMUSCULAR | Status: AC
  Administered 2011-03-06: 10000 [IU] via SUBCUTANEOUS
  Filled 2011-03-06: qty 1

## 2011-03-18 ENCOUNTER — Other Ambulatory Visit: Payer: Self-pay

## 2011-03-18 ENCOUNTER — Telehealth: Payer: Self-pay | Admitting: Gastroenterology

## 2011-03-18 DIAGNOSIS — K5090 Crohn's disease, unspecified, without complications: Secondary | ICD-10-CM

## 2011-03-18 NOTE — Telephone Encounter (Signed)
Pt aware and ROV scheduled for 04/01/11

## 2011-03-18 NOTE — Telephone Encounter (Signed)
Yes, he can take immodium.  I havent seen him in 6 months, he was supposed to follow up 4-5 months ago.  He needs next available rov with me.

## 2011-03-18 NOTE — Telephone Encounter (Signed)
Pt has chronic diarrhea because of his crohn's but last night he had an episode while he was asleep where he had diarrhea while in the bed.  He has had recent sick contacts he was at the hospital and "several" people were sick and wearing mask.  He has no fever and no rectal bleeding.  His remicade is scheduled for 03/20/11.  He says that he takes cholestyramine and that always helps with the chronic diarrhea but wants to know if he can take imodium for this acute diarrhea or does he need to be seen?

## 2011-03-20 ENCOUNTER — Encounter (HOSPITAL_COMMUNITY)
Admission: RE | Admit: 2011-03-20 | Discharge: 2011-03-20 | Disposition: A | Discharge status: Home / Self Care | Payer: Medicare Other | Source: Ambulatory Visit | Attending: Nephrology | Admitting: Nephrology

## 2011-03-20 ENCOUNTER — Encounter (HOSPITAL_COMMUNITY): Payer: Self-pay

## 2011-03-20 ENCOUNTER — Encounter (HOSPITAL_COMMUNITY)
Admission: RE | Admit: 2011-03-20 | Discharge: 2011-03-20 | Disposition: A | Discharge status: Home / Self Care | Payer: Medicare Other | Source: Ambulatory Visit | Attending: Gastroenterology | Admitting: Gastroenterology

## 2011-03-20 DIAGNOSIS — K5090 Crohn's disease, unspecified, without complications: Secondary | ICD-10-CM

## 2011-03-20 LAB — IRON AND TIBC: UIBC: 132 ug/dL (ref 125–400)

## 2011-03-20 LAB — FERRITIN: Ferritin: 702 ng/mL — ABNORMAL HIGH (ref 22–322)

## 2011-03-20 MED ORDER — SODIUM CHLORIDE 0.9 % IV SOLN
INTRAVENOUS | Status: AC
  Administered 2011-03-20: 250 mL via INTRAVENOUS

## 2011-03-20 MED ORDER — EPOETIN ALFA 10000 UNIT/ML IJ SOLN: 10000.0000 [IU] | INTRAMUSCULAR | Status: DC

## 2011-03-20 MED ORDER — SODIUM CHLORIDE 0.9 % IV SOLN
5.0000 mg/kg | INTRAVENOUS | Status: DC
  Administered 2011-03-20: 400 mg via INTRAVENOUS
  Filled 2011-03-20: qty 40

## 2011-03-20 MED ORDER — ACETAMINOPHEN 325 MG PO TABS
650.0000 mg | ORAL_TABLET | Freq: Once | ORAL | Status: AC
  Administered 2011-03-20: 650 mg via ORAL

## 2011-03-20 MED ORDER — DIPHENHYDRAMINE HCL 25 MG PO CAPS
25.0000 mg | ORAL_CAPSULE | Freq: Once | ORAL | Status: AC
  Administered 2011-03-20: 25 mg via ORAL

## 2011-03-21 LAB — POCT HEMOGLOBIN-HEMACUE: Hemoglobin: 12.2 g/dL — ABNORMAL LOW (ref 13.0–17.0)

## 2011-04-01 ENCOUNTER — Encounter: Payer: Self-pay | Admitting: Gastroenterology

## 2011-04-01 ENCOUNTER — Other Ambulatory Visit (INDEPENDENT_AMBULATORY_CARE_PROVIDER_SITE_OTHER): Payer: Medicare Other

## 2011-04-01 ENCOUNTER — Ambulatory Visit (INDEPENDENT_AMBULATORY_CARE_PROVIDER_SITE_OTHER): Payer: Medicare Other | Admitting: Gastroenterology

## 2011-04-01 VITALS — BP 110/50 | HR 70 | Ht 76.0 in | Wt 168.0 lb

## 2011-04-01 DIAGNOSIS — R197 Diarrhea, unspecified: Secondary | ICD-10-CM

## 2011-04-01 LAB — COMPREHENSIVE METABOLIC PANEL
ALT: 18 U/L (ref 0–53)
Albumin: 3.6 g/dL (ref 3.5–5.2)
CO2: 25 mEq/L (ref 19–32)
Calcium: 8.8 mg/dL (ref 8.4–10.5)
Chloride: 107 mEq/L (ref 96–112)
GFR: 42.47 mL/min — ABNORMAL LOW (ref >60.00)
Glucose, Bld: 84 mg/dL (ref 70–99)
Potassium: 4.3 mEq/L (ref 3.5–5.1)
Sodium: 138 mEq/L (ref 135–145)
Total Bilirubin: 0.5 mg/dL (ref 0.3–1.2)
Total Protein: 6.7 g/dL (ref 6.0–8.3)

## 2011-04-01 LAB — CBC WITH DIFFERENTIAL/PLATELET
Basophils Absolute: 0 10*3/uL (ref 0.0–0.1)
Eosinophils Relative: 2.7 % (ref 0.0–5.0)
HCT: 33.3 % — ABNORMAL LOW (ref 39.0–52.0)
Lymphocytes Relative: 34.6 % (ref 12.0–46.0)
Lymphs Abs: 3 10*3/uL (ref 0.7–4.0)
Monocytes Relative: 8 % (ref 3.0–12.0)
Platelets: 134 10*3/uL — ABNORMAL LOW (ref 150.0–400.0)
WBC: 8.7 10*3/uL (ref 4.5–10.5)

## 2011-04-01 NOTE — Progress Notes (Signed)
Review of gastrointestinal problems:  1. crohn's diagnosed in 1970. He had abd surgeries (one in 70's, one in 80's). He believes only ileum was removed.Colonoscopy with Dr. Lajoyce Corners in mid 2008: this showed a wide-open IC valve, biopsies of ileum and colon showed chronic, active colitis however macroscopically he described it as a fairly normal-appearing. Colonoscopy Dr. Ardis Hughs 12/11 found no colitis, but actively ulcerated and inflamed neoterminal ileum (while he was on prednisone). 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 July, 2012: Doing very well on maintenance Remicade, cholestyramine.  January 2012 TB skin testing was negative  Remicade started February 2012 at 5 mg per kilogram  Cholestyramine started March, 2012 2. acute cholecystitis May 2012. Laparoscopic cholecystectomy   HPI: This is a  very pleasant 66 year old man whom I last saw 5 or 6 months ago. He should follow up prior to this.   Noticed a change in stooling.  About 3 weeks ago, he had solid stool followed by liquid stool. Now he has several loose stools a day and even had diarrhea in bed once.  No recent antibiotics.  He increased magnesium orally, he doubled the magnesium dose per recommendation of Dr. Posey Pronto (renal MD). The diarrhea started shortly thereafter.  Has been taking 4 lomotil a day.  Still takes one cholecystramine a day.    He is very clear that the diarrhea, change in stools started shortly after doubling his magnesium dose.   Past Medical History  Diagnosis Date  . Crohn's   . Anxiety   . Depression   . Renal insufficiency   . Anemia     Past Surgical History  Procedure Date  . Small intestine surgery     x 2  . Cholecystectomy 07-12-2010    Current Outpatient Prescriptions  Medication Sig Dispense Refill  . Acetaminophen 650 MG TABS Take 650 mg by mouth once. Prior to infusion  1 tablet  0  . amLODipine (NORVASC) 10 MG tablet Take 10 mg by mouth Nightly.        Marland Kitchen  ammonium lactate (AMLACTIN) 12 % lotion Apply 1 application topically as needed.        . Calcium Carbonate-Vit D-Min 600-400 MG-UNIT TABS Take 1 tablet by mouth 2 (two) times daily.        . cholestyramine (QUESTRAN) 4 GM/DOSE powder take 1 scoopful DISSOLVED IN WATER once daily  378 g  3  . diphenhydrAMINE (BENADRYL) 25 mg capsule Take 1-2 capsules (25-50 mg total) by mouth once. Prior to infusion  2 capsule  0  . divalproex (DEPAKOTE) 500 MG EC tablet Take 1,500 mg by mouth daily.       Marland Kitchen epoetin alfa (PROCRIT) 16109 UNIT/ML injection Inject 10,000 Units into the skin every 14 (fourteen) days.       . ferrous sulfate 325 (65 FE) MG tablet Take 325 mg by mouth 2 (two) times daily.        . flunisolide (NASALIDE) 0.025 % SOLN Inhale 1 spray into the lungs 2 (two) times daily. Each nostril       . inFLIXimab (REMICADE) 100 MG injection Infuse Remicade IV schedule 1 16m/kg every 8 weeks Premedicate with Tylenol 500-6588mby mouth and Benadryl 25-5061my mouth prior to infusion. Last PPD was on 12/2009.   1 each  6  . Loperamide HCl (LOPERAMIDE A-D PO) Take 1 capsule by mouth as needed.        . Lutein 20 MG TABS Take 1 tablet by mouth daily.        .Marland Kitchen  magnesium oxide (MAG-OX) 400 MG tablet Take 400 mg by mouth daily.        . metoprolol succinate (TOPROL-XL) 25 MG 24 hr tablet Take 25 mg by mouth daily.        . Multiple Vitamin (MULTIVITAMIN) tablet Take 1 tablet by mouth daily.        Marland Kitchen OLANZapine (ZYPREXA) 10 MG tablet Take 10 mg by mouth at bedtime.        Marland Kitchen omeprazole (PRILOSEC) 20 MG capsule Take 20 mg by mouth daily.        Marland Kitchen oxybutynin (DITROPAN XL) 15 MG 24 hr tablet Take 15 mg by mouth daily.        . vitamin E 400 UNIT capsule Take 400 Units by mouth daily.          Allergies as of 04/01/2011 - Review Complete 04/01/2011  Allergen Reaction Noted  . Azathioprine      Family History  Problem Relation Age of Onset  . Diabetes Mother     maternal grandmother  . Pneumonia Maternal  Grandmother   . Emphysema Father   . Colon cancer Neg Hx     History   Social History  . Marital Status: Divorced    Spouse Name: N/A    Number of Children: 1  . Years of Education: N/A   Occupational History  . retired    Social History Main Topics  . Smoking status: Current Everyday Smoker -- 1.0 packs/day for 49 years    Types: Cigarettes  . Smokeless tobacco: Never Used  . Alcohol Use: No  . Drug Use: No  . Sexually Active: Not on file   Other Topics Concern  . Not on file   Social History Narrative  . No narrative on file      Physical Exam: BP 110/50  Pulse 70  Ht 6' 4"  (1.93 m)  Wt 168 lb (76.204 kg)  BMI 20.45 kg/m2 Constitutional: generally well-appearing Psychiatric: alert and oriented x3 Abdomen: soft, nontender, nondistended, no obvious ascites, no peritoneal signs, normal bowel sounds     Assessment and plan: 66 y.o. male with Crohn's disease, change in stooling recently  He is quite clear that his diarrhea worsened shortly after increasing his magnesium dose, doubling it. Prior to that his bowels have been under very good control on Remicade every 8 weeks, cholestyramine daily. Now he is on that regimen plus Lomotil 4 pills a day and is still having quite a lot of diarrhea. He will get stool samples today, blood work. I also recommended he decrease his magnesium dose back to the original dose and call this office to report on his symptoms in 2 weeks.

## 2011-04-01 NOTE — Patient Instructions (Addendum)
One of your biggest health concerns is your smoking.  This increases your risk for most cancers and serious cardiovascular diseases such as strokes, heart attacks.  You should try your best to stop.  If you need assistance, please contact your PCP or Smoking Cessation Class at [facility] [phone]) or [place] (1-800-QUIT-NOW). Smoking cigarettes is terrible for your Crohn's.  If you stop smoking cigarettes your Crohn's will be much easier to manage. Decrease magnesium back to your previous level, call here in 2 weeks. You will have labs checked today in the basement lab.  Please head down after you check out with the front desk  (stool for c. Diff, routine culture, cbc, cmet).

## 2011-04-03 ENCOUNTER — Other Ambulatory Visit: Payer: Medicare Other

## 2011-04-03 ENCOUNTER — Encounter (HOSPITAL_COMMUNITY)
Admission: RE | Admit: 2011-04-03 | Discharge: 2011-04-03 | Disposition: A | Discharge status: Home / Self Care | Payer: Medicare Other | Source: Ambulatory Visit | Attending: Nephrology | Admitting: Nephrology

## 2011-04-03 ENCOUNTER — Encounter (HOSPITAL_COMMUNITY): Payer: Medicare Other

## 2011-04-03 DIAGNOSIS — N183 Chronic kidney disease, stage 3 unspecified: Secondary | ICD-10-CM | POA: Insufficient documentation

## 2011-04-03 DIAGNOSIS — D638 Anemia in other chronic diseases classified elsewhere: Secondary | ICD-10-CM | POA: Insufficient documentation

## 2011-04-03 DIAGNOSIS — R197 Diarrhea, unspecified: Secondary | ICD-10-CM

## 2011-04-03 MED ORDER — EPOETIN ALFA 10000 UNIT/ML IJ SOLN
10000.0000 [IU] | INTRAMUSCULAR | Status: DC
  Administered 2011-04-03: 10000 [IU] via SUBCUTANEOUS
  Filled 2011-04-03: qty 1

## 2011-04-07 ENCOUNTER — Other Ambulatory Visit: Payer: Medicare Other | Admitting: Lab

## 2011-04-07 LAB — STOOL CULTURE

## 2011-04-17 ENCOUNTER — Encounter (HOSPITAL_COMMUNITY)
Admission: RE | Admit: 2011-04-17 | Discharge: 2011-04-17 | Disposition: A | Discharge status: Home / Self Care | Payer: Medicare Other | Source: Ambulatory Visit | Attending: Nephrology | Admitting: Nephrology

## 2011-04-17 ENCOUNTER — Encounter (HOSPITAL_COMMUNITY): Payer: Self-pay

## 2011-04-17 DIAGNOSIS — D649 Anemia, unspecified: Secondary | ICD-10-CM | POA: Insufficient documentation

## 2011-04-17 LAB — FERRITIN: Ferritin: 778 ng/mL — ABNORMAL HIGH (ref 22–322)

## 2011-04-17 LAB — HEMOGLOBIN: Hemoglobin: 11.7 g/dL — ABNORMAL LOW (ref 13.0–17.0)

## 2011-04-17 LAB — IRON AND TIBC
Saturation Ratios: 37 % (ref 20–55)
UIBC: 151 ug/dL (ref 125–400)

## 2011-05-01 ENCOUNTER — Encounter (HOSPITAL_COMMUNITY): Payer: Self-pay

## 2011-05-01 ENCOUNTER — Encounter (HOSPITAL_COMMUNITY)
Admission: RE | Admit: 2011-05-01 | Discharge: 2011-05-01 | Disposition: A | Discharge status: Home / Self Care | Payer: Medicare Other | Source: Ambulatory Visit | Attending: Nephrology | Admitting: Nephrology

## 2011-05-01 DIAGNOSIS — D638 Anemia in other chronic diseases classified elsewhere: Secondary | ICD-10-CM | POA: Insufficient documentation

## 2011-05-01 DIAGNOSIS — D649 Anemia, unspecified: Secondary | ICD-10-CM

## 2011-05-01 DIAGNOSIS — N183 Chronic kidney disease, stage 3 unspecified: Secondary | ICD-10-CM | POA: Insufficient documentation

## 2011-05-01 LAB — HEMOGLOBIN: Hemoglobin: 11.6 g/dL — ABNORMAL LOW (ref 13.0–17.0)

## 2011-05-05 ENCOUNTER — Other Ambulatory Visit: Payer: Self-pay | Admitting: Gastroenterology

## 2011-05-05 ENCOUNTER — Other Ambulatory Visit (HOSPITAL_BASED_OUTPATIENT_CLINIC_OR_DEPARTMENT_OTHER): Payer: Medicare Other | Admitting: Lab

## 2011-05-05 DIAGNOSIS — D649 Anemia, unspecified: Secondary | ICD-10-CM

## 2011-05-05 DIAGNOSIS — N289 Disorder of kidney and ureter, unspecified: Secondary | ICD-10-CM

## 2011-05-05 LAB — CBC WITH DIFFERENTIAL/PLATELET
BASO%: 0.3 % (ref 0.0–2.0)
EOS%: 2 % (ref 0.0–7.0)
HCT: 35.9 % — ABNORMAL LOW (ref 38.4–49.9)
MCH: 33.5 pg — ABNORMAL HIGH (ref 27.2–33.4)
MCHC: 32.7 g/dL (ref 32.0–36.0)
MONO#: 0.6 10*3/uL (ref 0.1–0.9)
NEUT%: 63.4 % (ref 39.0–75.0)
RBC: 3.5 10*6/uL — ABNORMAL LOW (ref 4.20–5.82)
WBC: 8.6 10*3/uL (ref 4.0–10.3)
lymph#: 2.3 10*3/uL (ref 0.9–3.3)

## 2011-05-07 ENCOUNTER — Telehealth: Payer: Self-pay | Admitting: *Deleted

## 2011-05-07 NOTE — Telephone Encounter (Signed)
Message copied by Maudie Mercury on Wed May 07, 2011 12:02 PM ------      Message from: Sherryl Manges T      Created: Tue May 06, 2011  3:57 PM       Please call pt.  His Hgb is stable.  Will continue observation.  Thanks.

## 2011-05-07 NOTE — Telephone Encounter (Signed)
Left pt VM on cell phone to call back regarding his lab work.

## 2011-05-09 ENCOUNTER — Telehealth: Payer: Self-pay | Admitting: *Deleted

## 2011-05-09 NOTE — Telephone Encounter (Signed)
Pt returned call and I informed him that his Hgb was stable per Dr. Lamonte Sakai and to keep next lab appt in June as scheduled.  He verbalized understanding.

## 2011-05-14 ENCOUNTER — Other Ambulatory Visit: Payer: Self-pay

## 2011-05-14 DIAGNOSIS — K5090 Crohn's disease, unspecified, without complications: Secondary | ICD-10-CM

## 2011-05-16 ENCOUNTER — Encounter (HOSPITAL_COMMUNITY)
Admission: RE | Admit: 2011-05-16 | Discharge: 2011-05-16 | Disposition: A | Discharge status: Home / Self Care | Payer: Medicare Other | Source: Ambulatory Visit | Attending: Gastroenterology | Admitting: Gastroenterology

## 2011-05-16 VITALS — BP 131/78 | HR 84 | Temp 97.8°F | Resp 18 | Ht 76.0 in | Wt 173.4 lb

## 2011-05-16 DIAGNOSIS — D649 Anemia, unspecified: Secondary | ICD-10-CM

## 2011-05-16 DIAGNOSIS — K5090 Crohn's disease, unspecified, without complications: Secondary | ICD-10-CM

## 2011-05-16 LAB — HEMOGLOBIN: Hemoglobin: 11.3 g/dL — ABNORMAL LOW (ref 13.0–17.0)

## 2011-05-16 LAB — IRON AND TIBC
Saturation Ratios: 41 % (ref 20–55)
UIBC: 149 ug/dL (ref 125–400)

## 2011-05-16 MED ORDER — SODIUM CHLORIDE 0.9 % IV SOLN
5.0000 mg/kg | INTRAVENOUS | Status: AC
  Administered 2011-05-16: 400 mg via INTRAVENOUS
  Filled 2011-05-16: qty 40

## 2011-05-16 MED ORDER — EPOETIN ALFA 10000 UNIT/ML IJ SOLN
10000.0000 [IU] | INTRAMUSCULAR | Status: DC
  Administered 2011-05-16: 10000 [IU] via SUBCUTANEOUS

## 2011-05-16 MED ORDER — SODIUM CHLORIDE 0.9 % IV SOLN
INTRAVENOUS | Status: AC
  Administered 2011-05-16: 250 mL via INTRAVENOUS

## 2011-05-16 MED ORDER — DIPHENHYDRAMINE HCL 25 MG PO CAPS: 50.0000 mg | ORAL_CAPSULE | ORAL | Status: DC

## 2011-05-16 MED ORDER — DIPHENHYDRAMINE HCL 25 MG PO CAPS
25.0000 mg | ORAL_CAPSULE | Freq: Once | ORAL | Status: AC
  Administered 2011-05-16: 25 mg via ORAL

## 2011-05-16 MED ORDER — ACETAMINOPHEN 325 MG PO TABS: 650.0000 mg | ORAL_TABLET | ORAL | Status: DC

## 2011-05-16 MED ORDER — ACETAMINOPHEN 325 MG PO TABS
650.0000 mg | ORAL_TABLET | Freq: Once | ORAL | Status: AC
  Administered 2011-05-16: 650 mg via ORAL

## 2011-05-16 MED ORDER — ACETAMINOPHEN ER 650 MG PO TBCR: 650.0000 mg | EXTENDED_RELEASE_TABLET | ORAL | Status: DC

## 2011-05-16 MED ORDER — EPOETIN ALFA 10000 UNIT/ML IJ SOLN
INTRAMUSCULAR | Status: AC
  Administered 2011-05-16: 10000 [IU] via SUBCUTANEOUS
  Filled 2011-05-16: qty 1

## 2011-05-16 NOTE — Discharge Instructions (Signed)
Infliximab injection What is this medicine? INFLIXIMAB (in [place] i mab) is used to treat Crohn's disease and ulcerative colitis. It is also used to treat ankylosing spondylitis, psoriasis, and some forms of arthritis. This medicine may be used for other purposes; ask your health care provider or pharmacist if you have questions. What should I tell my health care provider before I take this medicine? They need to know if you have any of these conditions: -diabetes -exposure to tuberculosis -heart failure -hepatitis or liver disease -immune system problems -infection -lung or breathing disease, like COPD -multiple sclerosis -current or past resident of [place] or [place] -seizure disorder -an unusual or allergic reaction to infliximab, mouse proteins, other medicines, foods, dyes, or preservatives -pregnant or trying to get pregnant -breast-feeding How should I use this medicine? This medicine is for injection into a vein. It is usually given by a health care professional in a hospital or clinic setting. A special MedGuide will be given to you by the pharmacist with each prescription and refill. Be sure to read this information carefully each time. Talk to your pediatrician regarding the use of this medicine in children. Special care may be needed. Overdosage: If you think you have taken too much of this medicine contact a poison control center or emergency room at once. NOTE: This medicine is only for you. Do not share this medicine with others. What if I miss a dose? It is important not to miss your dose. Call your doctor or health care professional if you are unable to keep an appointment. What may interact with this medicine? Do not take this medicine with any of the following medications: -anakinra -rilonacept This medicine may also interact with the following medications: -vaccines This list may not describe all possible interactions. Give your health care provider  a list of all the medicines, herbs, non-prescription drugs, or dietary supplements you use. Also tell them if you smoke, drink alcohol, or use illegal drugs. Some items may interact with your medicine. What should I watch for while using this medicine? Visit your doctor or health care professional for regular checks on your progress. If you get a cold or other infection while receiving this medicine, call your doctor or health care professional. Do not treat yourself. This medicine may decrease your body's ability to fight infections. Before beginning therapy, your doctor may do a test to see if you have been exposed to tuberculosis. This medicine may make the symptoms of heart failure worse in some patients. If you notice symptoms such as increased shortness of breath or swelling of the ankles or legs, contact your health care provider right away. If you are going to have surgery or dental work, tell your health care professional or dentist that you have received this medicine. If you take this medicine for plaque psoriasis, stay out of the sun. If you cannot avoid being in the sun, wear protective clothing and use sunscreen. Do not use sun lamps or tanning beds/booths. What side effects may I notice from receiving this medicine? Side effects that you should report to your doctor or health care professional as soon as possible: -allergic reactions like skin rash, itching or hives, swelling of the face, lips, or tongue -chest pain -fever or chills, usually related to the infusion -muscle or joint pain -red, scaly patches or raised bumps on the skin -signs of infection - fever or chills, cough, sore throat, pain or difficulty passing urine -swollen lymph nodes in the neck, underarm,  or groin areas -unexplained weight loss -unusual bleeding or bruising -unusually weak or tired -yellowing of the eyes or skin Side effects that usually do not require medical attention (report to your doctor or health  care professional if they continue or are bothersome): -headache -heartburn or stomach pain -nausea, vomiting This list may not describe all possible side effects. Call your doctor for medical advice about side effects. You may report side effects to FDA at 1-800-FDA-1088. Where should I keep my medicine? This drug is given in a hospital or clinic and will not be stored at home. NOTE: This sheet is a summary. It may not cover all possible information. If you have questions about this medicine, talk to your doctor, pharmacist, or health care provider.  2012, Elsevier/Gold Standard. (09/29/2007 10:26:02 AM)

## 2011-05-30 ENCOUNTER — Encounter (HOSPITAL_COMMUNITY): Payer: Self-pay

## 2011-05-30 ENCOUNTER — Encounter (HOSPITAL_COMMUNITY)
Admission: RE | Admit: 2011-05-30 | Discharge: 2011-05-30 | Disposition: A | Discharge status: Home / Self Care | Payer: Medicare Other | Source: Ambulatory Visit | Attending: Oncology | Admitting: Oncology

## 2011-05-30 VITALS — BP 127/76 | HR 90 | Temp 98.6°F | Resp 18

## 2011-05-30 DIAGNOSIS — D649 Anemia, unspecified: Secondary | ICD-10-CM | POA: Insufficient documentation

## 2011-05-30 LAB — HEMOGLOBIN: Hemoglobin: 10.7 g/dL — ABNORMAL LOW (ref 13.0–17.0)

## 2011-05-30 MED ORDER — EPOETIN ALFA 10000 UNIT/ML IJ SOLN
10000.0000 [IU] | INTRAMUSCULAR | Status: DC
  Administered 2011-05-30: 10000 [IU] via SUBCUTANEOUS

## 2011-05-30 MED ORDER — EPOETIN ALFA 10000 UNIT/ML IJ SOLN
INTRAMUSCULAR | Status: AC
  Filled 2011-05-30: qty 1

## 2011-05-30 NOTE — Discharge Instructions (Signed)
Epoetin Alfa injection What is this medicine? EPOETIN ALFA (e POE e tin AL fa) helps your body make more red blood cells. This medicine is used to treat anemia caused by chronic kidney failure, cancer chemotherapy, or HIV-therapy. It may also be used before surgery if you have anemia. This medicine may be used for other purposes; ask your health care provider or pharmacist if you have questions. What should I tell my health care provider before I take this medicine? They need to know if you have any of these conditions: -blood clotting disorders -cancer patient not on chemotherapy -cystic fibrosis -heart disease, such as angina or heart failure -hemoglobin level of 12 g/dL or greater -high blood pressure -low levels of folate, iron, or vitamin B12 -seizures -an unusual or allergic reaction to erythropoietin, albumin, benzyl alcohol, hamster proteins, other medicines, foods, dyes, or preservatives -pregnant or trying to get pregnant -breast-feeding How should I use this medicine? This medicine is for injection into a vein or under the skin. It is usually given by a health care professional in a hospital or clinic setting. If you get this medicine at home, you will be taught how to prepare and give this medicine. Use exactly as directed. Take your medicine at regular intervals. Do not take your medicine more often than directed. It is important that you put your used needles and syringes in a special sharps container. Do not put them in a trash can. If you do not have a sharps container, call your pharmacist or healthcare provider to get one. Talk to your pediatrician regarding the use of this medicine in children. While this drug may be prescribed for selected conditions, precautions do apply. Overdosage: If you think you have taken too much of this medicine contact a poison control center or emergency room at once. NOTE: This medicine is only for you. Do not share this medicine with  others. What if I miss a dose? If you miss a dose, take it as soon as you can. If it is almost time for your next dose, take only that dose. Do not take double or extra doses. What may interact with this medicine? Do not take this medicine with any of the following medications: -darbepoetin alfa This list may not describe all possible interactions. Give your health care provider a list of all the medicines, herbs, non-prescription drugs, or dietary supplements you use. Also tell them if you smoke, drink alcohol, or use illegal drugs. Some items may interact with your medicine. What should I watch for while using this medicine? Visit your prescriber or health care professional for regular checks on your progress and for the needed blood tests and blood pressure measurements. It is especially important for the doctor to make sure your hemoglobin level is in the desired range, to limit the risk of potential side effects and to give you the best benefit. Keep all appointments for any recommended tests. Check your blood pressure as directed. Ask your doctor what your blood pressure should be and when you should contact him or her. As your body makes more red blood cells, you may need to take iron, folic acid, or vitamin B supplements. Ask your doctor or health care provider which products are right for you. If you have kidney disease continue dietary restrictions, even though this medication can make you feel better. Talk with your doctor or health care professional about the foods you eat and the vitamins that you take. What side effects may I notice  from receiving this medicine? Side effects that you should report to your doctor or health care professional as soon as possible: -allergic reactions like skin rash, itching or hives, swelling of the face, lips, or tongue -breathing problems -changes in vision -chest pain -confusion, trouble speaking or understanding -feeling faint or lightheaded,  falls -high blood pressure -muscle aches or pains -pain, swelling, warmth in the leg -rapid weight gain -severe headaches -sudden numbness or weakness of the face, arm or leg -trouble walking, dizziness, loss of balance or coordination -seizures (convulsions) -swelling of the ankles, feet, hands -unusually weak or tired Side effects that usually do not require medical attention (report to your doctor or health care professional if they continue or are bothersome): -diarrhea -fever, chills (flu-like symptoms) -headaches -nausea, vomiting -redness, stinging, or swelling at site where injected This list may not describe all possible side effects. Call your doctor for medical advice about side effects. You may report side effects to FDA at 1-800-FDA-1088. Where should I keep my medicine? Keep out of the reach of children. Store in a refrigerator between 2 and 8 degrees C (36 and 46 degrees F). Do not freeze or shake. Throw away any unused portion if using a single-dose vial. Multi-dose vials can be kept in the refrigerator for up to 21 days after the initial dose. Throw away unused medicine. NOTE: This sheet is a summary. It may not cover all possible information. If you have questions about this medicine, talk to your doctor, pharmacist, or health care provider.  2012, Elsevier/Gold Standard. (01/25/2008 10:25:44 AM)

## 2011-06-09 ENCOUNTER — Other Ambulatory Visit: Payer: Medicare Other

## 2011-06-13 ENCOUNTER — Encounter (HOSPITAL_COMMUNITY): Payer: Self-pay

## 2011-06-13 ENCOUNTER — Encounter (HOSPITAL_COMMUNITY)
Admission: RE | Admit: 2011-06-13 | Discharge: 2011-06-13 | Disposition: A | Discharge status: Home / Self Care | Payer: Medicare Other | Source: Ambulatory Visit | Attending: Oncology | Admitting: Oncology

## 2011-06-13 VITALS — BP 134/81 | HR 89 | Temp 98.3°F | Resp 16

## 2011-06-13 DIAGNOSIS — D649 Anemia, unspecified: Secondary | ICD-10-CM | POA: Insufficient documentation

## 2011-06-13 LAB — HEMOGLOBIN: Hemoglobin: 12 g/dL — ABNORMAL LOW (ref 13.0–17.0)

## 2011-06-13 MED ORDER — EPOETIN ALFA 10000 UNIT/ML IJ SOLN: 10000.0000 [IU] | INTRAMUSCULAR | Status: DC

## 2011-06-13 NOTE — Discharge Instructions (Signed)

## 2011-06-27 ENCOUNTER — Encounter (HOSPITAL_COMMUNITY)
Admission: RE | Admit: 2011-06-27 | Discharge: 2011-06-27 | Disposition: A | Discharge status: Home / Self Care | Payer: Medicare Other | Source: Ambulatory Visit | Attending: Oncology | Admitting: Oncology

## 2011-06-27 ENCOUNTER — Encounter (HOSPITAL_COMMUNITY): Payer: Self-pay

## 2011-06-27 VITALS — BP 124/80 | HR 84 | Temp 98.8°F | Resp 18

## 2011-06-27 DIAGNOSIS — D649 Anemia, unspecified: Secondary | ICD-10-CM | POA: Insufficient documentation

## 2011-06-27 DIAGNOSIS — K5090 Crohn's disease, unspecified, without complications: Secondary | ICD-10-CM | POA: Insufficient documentation

## 2011-06-27 MED ORDER — EPOETIN ALFA 10000 UNIT/ML IJ SOLN
10000.0000 [IU] | INTRAMUSCULAR | Status: DC
  Administered 2011-06-27: 10000 [IU] via SUBCUTANEOUS

## 2011-06-27 MED ORDER — EPOETIN ALFA 10000 UNIT/ML IJ SOLN
INTRAMUSCULAR | Status: AC
  Administered 2011-06-27: 10000 [IU] via SUBCUTANEOUS
  Filled 2011-06-27: qty 1

## 2011-06-27 NOTE — Discharge Instructions (Signed)
Refer to printed sheet for next appointment. Short Stay Phone # [phone]   YOUR NEXT APPOINTMENT IN SHORT STAY FOR REMICADE LABWORK AND PROCRIT INJECTION IS  07/11/11

## 2011-07-10 ENCOUNTER — Other Ambulatory Visit: Payer: Self-pay

## 2011-07-10 DIAGNOSIS — K5090 Crohn's disease, unspecified, without complications: Secondary | ICD-10-CM

## 2011-07-11 ENCOUNTER — Encounter (HOSPITAL_COMMUNITY)
Admission: RE | Admit: 2011-07-11 | Discharge: 2011-07-11 | Disposition: A | Discharge status: Home / Self Care | Payer: Medicare Other | Source: Ambulatory Visit | Attending: Gastroenterology | Admitting: Gastroenterology

## 2011-07-11 ENCOUNTER — Encounter (HOSPITAL_COMMUNITY): Payer: Self-pay

## 2011-07-11 VITALS — BP 134/80 | HR 59 | Temp 98.1°F | Resp 18 | Ht 76.0 in | Wt 174.0 lb

## 2011-07-11 DIAGNOSIS — K5090 Crohn's disease, unspecified, without complications: Secondary | ICD-10-CM

## 2011-07-11 DIAGNOSIS — D649 Anemia, unspecified: Secondary | ICD-10-CM

## 2011-07-11 MED ORDER — DIPHENHYDRAMINE HCL 25 MG PO CAPS
ORAL_CAPSULE | ORAL | Status: AC
  Filled 2011-07-11: qty 2

## 2011-07-11 MED ORDER — ACETAMINOPHEN 325 MG PO TABS: 650.0000 mg | ORAL_TABLET | ORAL | Status: DC

## 2011-07-11 MED ORDER — INFLIXIMAB 100 MG IV SOLR
5.0000 mg/kg | INTRAVENOUS | Status: DC
  Filled 2011-07-11: qty 40

## 2011-07-11 MED ORDER — DIPHENHYDRAMINE HCL 25 MG PO CAPS
50.0000 mg | ORAL_CAPSULE | ORAL | Status: DC
  Administered 2011-07-11: 50 mg via ORAL

## 2011-07-11 MED ORDER — SODIUM CHLORIDE 0.9 % IV SOLN
INTRAVENOUS | Status: DC
  Administered 2011-07-11: 250 mL via INTRAVENOUS

## 2011-07-11 MED ORDER — ACETAMINOPHEN 325 MG PO TABS
ORAL_TABLET | ORAL | Status: AC
  Administered 2011-07-11: 650 mg
  Filled 2011-07-11: qty 2

## 2011-07-11 MED ORDER — SODIUM CHLORIDE 0.9 % IV SOLN
400.0000 mg | Freq: Once | INTRAVENOUS | Status: AC
  Administered 2011-07-11: 400 mg via INTRAVENOUS
  Filled 2011-07-11: qty 40

## 2011-07-11 NOTE — Discharge Instructions (Signed)
Infliximab injection What is this medicine? INFLIXIMAB (in [place] i mab) is used to treat Crohn's disease and ulcerative colitis. It is also used to treat ankylosing spondylitis, psoriasis, and some forms of arthritis. This medicine may be used for other purposes; ask your health care provider or pharmacist if you have questions. What should I tell my health care provider before I take this medicine? They need to know if you have any of these conditions: -diabetes -exposure to tuberculosis -heart failure -hepatitis or liver disease -immune system problems -infection -lung or breathing disease, like COPD -multiple sclerosis -current or past resident of [place] or [place] -seizure disorder -an unusual or allergic reaction to infliximab, mouse proteins, other medicines, foods, dyes, or preservatives -pregnant or trying to get pregnant -breast-feeding How should I use this medicine? This medicine is for injection into a vein. It is usually given by a health care professional in a hospital or clinic setting. A special MedGuide will be given to you by the pharmacist with each prescription and refill. Be sure to read this information carefully each time. Talk to your pediatrician regarding the use of this medicine in children. Special care may be needed. Overdosage: If you think you have taken too much of this medicine contact a poison control center or emergency room at once. NOTE: This medicine is only for you. Do not share this medicine with others. What if I miss a dose? It is important not to miss your dose. Call your doctor or health care professional if you are unable to keep an appointment. What may interact with this medicine? Do not take this medicine with any of the following medications: -anakinra -rilonacept This medicine may also interact with the following medications: -vaccines This list may not describe all possible interactions. Give your health care provider  a list of all the medicines, herbs, non-prescription drugs, or dietary supplements you use. Also tell them if you smoke, drink alcohol, or use illegal drugs. Some items may interact with your medicine. What should I watch for while using this medicine? Visit your doctor or health care professional for regular checks on your progress. If you get a cold or other infection while receiving this medicine, call your doctor or health care professional. Do not treat yourself. This medicine may decrease your body's ability to fight infections. Before beginning therapy, your doctor may do a test to see if you have been exposed to tuberculosis. This medicine may make the symptoms of heart failure worse in some patients. If you notice symptoms such as increased shortness of breath or swelling of the ankles or legs, contact your health care provider right away. If you are going to have surgery or dental work, tell your health care professional or dentist that you have received this medicine. If you take this medicine for plaque psoriasis, stay out of the sun. If you cannot avoid being in the sun, wear protective clothing and use sunscreen. Do not use sun lamps or tanning beds/booths. What side effects may I notice from receiving this medicine? Side effects that you should report to your doctor or health care professional as soon as possible: -allergic reactions like skin rash, itching or hives, swelling of the face, lips, or tongue -chest pain -fever or chills, usually related to the infusion -muscle or joint pain -red, scaly patches or raised bumps on the skin -signs of infection - fever or chills, cough, sore throat, pain or difficulty passing urine -swollen lymph nodes in the neck, underarm,  or groin areas -unexplained weight loss -unusual bleeding or bruising -unusually weak or tired -yellowing of the eyes or skin Side effects that usually do not require medical attention (report to your doctor or health  care professional if they continue or are bothersome): -headache -heartburn or stomach pain -nausea, vomiting This list may not describe all possible side effects. Call your doctor for medical advice about side effects. You may report side effects to FDA at 1-800-FDA-1088. Where should I keep my medicine? This drug is given in a hospital or clinic and will not be stored at home. NOTE: This sheet is a summary. It may not cover all possible information. If you have questions about this medicine, talk to your doctor, pharmacist, or health care provider.  2012, Elsevier/Gold Standard. (09/29/2007 10:26:02 AM)

## 2011-07-25 ENCOUNTER — Other Ambulatory Visit (HOSPITAL_COMMUNITY): Payer: Medicare Other

## 2011-08-01 ENCOUNTER — Encounter (HOSPITAL_COMMUNITY): Payer: Self-pay

## 2011-08-01 ENCOUNTER — Encounter (HOSPITAL_COMMUNITY)
Admission: RE | Admit: 2011-08-01 | Discharge: 2011-08-01 | Disposition: A | Discharge status: Home / Self Care | Payer: Medicare Other | Source: Ambulatory Visit | Attending: Nephrology | Admitting: Nephrology

## 2011-08-01 VITALS — BP 138/76 | HR 93 | Temp 98.6°F | Resp 18 | Ht 76.0 in | Wt 173.0 lb

## 2011-08-01 DIAGNOSIS — D649 Anemia, unspecified: Secondary | ICD-10-CM | POA: Insufficient documentation

## 2011-08-01 MED ORDER — EPOETIN ALFA 10000 UNIT/ML IJ SOLN
INTRAMUSCULAR | Status: AC
  Filled 2011-08-01: qty 4

## 2011-08-01 MED ORDER — EPOETIN ALFA 10000 UNIT/ML IJ SOLN
10000.0000 [IU] | INTRAMUSCULAR | Status: DC
  Administered 2011-08-01: 10000 [IU] via SUBCUTANEOUS

## 2011-08-01 NOTE — Discharge Instructions (Signed)
Anemia, Nonspecific Your exam and blood tests show you are anemic. This means your blood (hemoglobin) level is low. Normal hemoglobin values are 12 to 15 g/dL for females and 14 to 17 g/dL for males. Make a note of your hemoglobin level today. The hematocrit percent is also used to measure anemia. A normal hematocrit is 38% to 46% in females and 42% to 49% in males. Make a note of your hematocrit level today. CAUSES  Anemia can be due to many different causes.  Excessive bleeding from periods (in women).   Intestinal bleeding.   Poor nutrition.   Kidney, thyroid, liver, and bone marrow diseases.  SYMPTOMS  Anemia can come on suddenly (acute). It can also come on slowly. Symptoms can include:  Minor weakness.   Dizziness.   Palpitations.   Shortness of breath.  Symptoms may be absent until half your hemoglobin is missing if it comes on slowly. Anemia due to acute blood loss from an injury or internal bleeding may require blood transfusion if the loss is severe. Hospital care is needed if you are anemic and there is significant continual blood loss. TREATMENT   Stool tests for blood (Hemoccult) and additional lab tests are often needed. This determines the best treatment.   Further checking on your condition and your response to treatment is very important. It often takes many weeks to correct anemia.  Depending on the cause, treatment can include:  Supplements of iron.   Vitamins F16 and folic acid.   Hormone medicines.If your anemia is due to bleeding, finding the cause of the blood loss is very important. This will help avoid further problems.  SEEK IMMEDIATE MEDICAL CARE IF:   You develop fainting, extreme weakness, shortness of breath, or chest pain.   You develop heavy vaginal bleeding.   You develop bloody or black, tarry stools or vomit up blood.   You develop a high fever, rash, repeated vomiting, or dehydration.  Document Released: 03/20/2004 Document Revised:  01/30/2011 Document Reviewed: 12/26/2008 [facility] Patient Information 2012 [place].

## 2011-08-04 ENCOUNTER — Other Ambulatory Visit: Payer: Medicare Other

## 2011-08-05 ENCOUNTER — Ambulatory Visit (HOSPITAL_BASED_OUTPATIENT_CLINIC_OR_DEPARTMENT_OTHER): Payer: Medicare Other

## 2011-08-05 DIAGNOSIS — K5090 Crohn's disease, unspecified, without complications: Secondary | ICD-10-CM

## 2011-08-05 DIAGNOSIS — N289 Disorder of kidney and ureter, unspecified: Secondary | ICD-10-CM

## 2011-08-05 DIAGNOSIS — D649 Anemia, unspecified: Secondary | ICD-10-CM

## 2011-08-05 LAB — CBC WITH DIFFERENTIAL/PLATELET
Basophils Absolute: 0 10*3/uL (ref 0.0–0.1)
Eosinophils Absolute: 0.2 10*3/uL (ref 0.0–0.5)
HCT: 33.4 % — ABNORMAL LOW (ref 38.4–49.9)
LYMPH%: 28.6 % (ref 14.0–49.0)
MONO#: 0.6 10*3/uL (ref 0.1–0.9)
NEUT#: 5.2 10*3/uL (ref 1.5–6.5)
NEUT%: 61.5 % (ref 39.0–75.0)
Platelets: 144 10*3/uL (ref 140–400)
WBC: 8.4 10*3/uL (ref 4.0–10.3)

## 2011-08-05 LAB — COMPREHENSIVE METABOLIC PANEL
CO2: 25 mEq/L (ref 19–32)
Creatinine, Ser: 1.77 mg/dL — ABNORMAL HIGH (ref 0.50–1.35)
Glucose, Bld: 82 mg/dL (ref 70–99)
Total Bilirubin: 0.3 mg/dL (ref 0.3–1.2)
Total Protein: 6.8 g/dL (ref 6.0–8.3)

## 2011-08-07 LAB — IRON AND TIBC
%SAT: 22 % (ref 20–55)
Iron: 51 ug/dL (ref 42–165)
UIBC: 182 ug/dL (ref 125–400)

## 2011-08-07 LAB — PROTEIN ELECTROPHORESIS, SERUM
Alpha-2-Globulin: 7.8 % (ref 7.1–11.8)
Gamma Globulin: 19.1 % — ABNORMAL HIGH (ref 11.1–18.8)

## 2011-08-07 LAB — FERRITIN: Ferritin: 889 ng/mL — ABNORMAL HIGH (ref 22–322)

## 2011-08-08 ENCOUNTER — Other Ambulatory Visit: Payer: Medicare Other

## 2011-08-08 ENCOUNTER — Ambulatory Visit: Payer: Medicare Other | Admitting: Oncology

## 2011-08-15 ENCOUNTER — Encounter (HOSPITAL_COMMUNITY): Payer: Self-pay

## 2011-08-15 ENCOUNTER — Encounter (HOSPITAL_COMMUNITY)
Admission: RE | Admit: 2011-08-15 | Discharge: 2011-08-15 | Disposition: A | Discharge status: Home / Self Care | Payer: Medicare Other | Source: Ambulatory Visit | Attending: Nephrology | Admitting: Nephrology

## 2011-08-15 VITALS — BP 122/69 | HR 69 | Temp 98.3°F | Resp 18

## 2011-08-15 DIAGNOSIS — D649 Anemia, unspecified: Secondary | ICD-10-CM

## 2011-08-15 LAB — HEMOGLOBIN: Hemoglobin: 10.9 g/dL — ABNORMAL LOW (ref 13.0–17.0)

## 2011-08-15 MED ORDER — EPOETIN ALFA 10000 UNIT/ML IJ SOLN
10000.0000 [IU] | INTRAMUSCULAR | Status: DC
  Administered 2011-08-15: 10000 [IU] via SUBCUTANEOUS

## 2011-08-15 MED ORDER — EPOETIN ALFA 10000 UNIT/ML IJ SOLN
INTRAMUSCULAR | Status: AC
  Filled 2011-08-15: qty 1

## 2011-08-15 NOTE — Discharge Instructions (Signed)
Epoetin Alfa injection What is this medicine? EPOETIN ALFA (e POE e tin AL fa) helps your body make more red blood cells. This medicine is used to treat anemia caused by chronic kidney failure, cancer chemotherapy, or HIV-therapy. It may also be used before surgery if you have anemia. This medicine may be used for other purposes; ask your health care provider or pharmacist if you have questions. What should I tell my health care provider before I take this medicine? They need to know if you have any of these conditions: -blood clotting disorders -cancer patient not on chemotherapy -cystic fibrosis -heart disease, such as angina or heart failure -hemoglobin level of 12 g/dL or greater -high blood pressure -low levels of folate, iron, or vitamin B12 -seizures -an unusual or allergic reaction to erythropoietin, albumin, benzyl alcohol, hamster proteins, other medicines, foods, dyes, or preservatives -pregnant or trying to get pregnant -breast-feeding How should I use this medicine? This medicine is for injection into a vein or under the skin. It is usually given by a health care professional in a hospital or clinic setting. If you get this medicine at home, you will be taught how to prepare and give this medicine. Use exactly as directed. Take your medicine at regular intervals. Do not take your medicine more often than directed. It is important that you put your used needles and syringes in a special sharps container. Do not put them in a trash can. If you do not have a sharps container, call your pharmacist or healthcare provider to get one. Talk to your pediatrician regarding the use of this medicine in children. While this drug may be prescribed for selected conditions, precautions do apply. Overdosage: If you think you have taken too much of this medicine contact a poison control center or emergency room at once. NOTE: This medicine is only for you. Do not share this medicine with  others. What if I miss a dose? If you miss a dose, take it as soon as you can. If it is almost time for your next dose, take only that dose. Do not take double or extra doses. What may interact with this medicine? Do not take this medicine with any of the following medications: -darbepoetin alfa This list may not describe all possible interactions. Give your health care provider a list of all the medicines, herbs, non-prescription drugs, or dietary supplements you use. Also tell them if you smoke, drink alcohol, or use illegal drugs. Some items may interact with your medicine. What should I watch for while using this medicine? Visit your prescriber or health care professional for regular checks on your progress and for the needed blood tests and blood pressure measurements. It is especially important for the doctor to make sure your hemoglobin level is in the desired range, to limit the risk of potential side effects and to give you the best benefit. Keep all appointments for any recommended tests. Check your blood pressure as directed. Ask your doctor what your blood pressure should be and when you should contact him or her. As your body makes more red blood cells, you may need to take iron, folic acid, or vitamin B supplements. Ask your doctor or health care provider which products are right for you. If you have kidney disease continue dietary restrictions, even though this medication can make you feel better. Talk with your doctor or health care professional about the foods you eat and the vitamins that you take. What side effects may I notice  from receiving this medicine? Side effects that you should report to your doctor or health care professional as soon as possible: -allergic reactions like skin rash, itching or hives, swelling of the face, lips, or tongue -breathing problems -changes in vision -chest pain -confusion, trouble speaking or understanding -feeling faint or lightheaded,  falls -high blood pressure -muscle aches or pains -pain, swelling, warmth in the leg -rapid weight gain -severe headaches -sudden numbness or weakness of the face, arm or leg -trouble walking, dizziness, loss of balance or coordination -seizures (convulsions) -swelling of the ankles, feet, hands -unusually weak or tired Side effects that usually do not require medical attention (report to your doctor or health care professional if they continue or are bothersome): -diarrhea -fever, chills (flu-like symptoms) -headaches -nausea, vomiting -redness, stinging, or swelling at site where injected This list may not describe all possible side effects. Call your doctor for medical advice about side effects. You may report side effects to FDA at 1-800-FDA-1088. Where should I keep my medicine? Keep out of the reach of children. Store in a refrigerator between 2 and 8 degrees C (36 and 46 degrees F). Do not freeze or shake. Throw away any unused portion if using a single-dose vial. Multi-dose vials can be kept in the refrigerator for up to 21 days after the initial dose. Throw away unused medicine. NOTE: This sheet is a summary. It may not cover all possible information. If you have questions about this medicine, talk to your doctor, pharmacist, or health care provider.  2012, Elsevier/Gold Standard. (01/25/2008 10:25:44 AM)

## 2011-09-05 ENCOUNTER — Encounter (HOSPITAL_COMMUNITY): Admission: RE | Admit: 2011-09-05 | Payer: Medicare Other | Source: Ambulatory Visit

## 2011-09-15 ENCOUNTER — Other Ambulatory Visit: Payer: Self-pay

## 2011-09-15 DIAGNOSIS — K5090 Crohn's disease, unspecified, without complications: Secondary | ICD-10-CM

## 2011-09-16 ENCOUNTER — Encounter (HOSPITAL_COMMUNITY): Payer: Self-pay

## 2011-09-16 ENCOUNTER — Encounter (HOSPITAL_COMMUNITY)
Admission: RE | Admit: 2011-09-16 | Discharge: 2011-09-16 | Disposition: A | Discharge status: Home / Self Care | Payer: Medicare Other | Source: Ambulatory Visit | Attending: Gastroenterology | Admitting: Gastroenterology

## 2011-09-16 VITALS — BP 137/86 | HR 74 | Temp 97.5°F | Resp 18 | Ht 76.0 in | Wt 168.4 lb

## 2011-09-16 DIAGNOSIS — D649 Anemia, unspecified: Secondary | ICD-10-CM

## 2011-09-16 DIAGNOSIS — K5090 Crohn's disease, unspecified, without complications: Secondary | ICD-10-CM | POA: Insufficient documentation

## 2011-09-16 MED ORDER — SODIUM CHLORIDE 0.9 % IV SOLN: INTRAVENOUS | Status: DC

## 2011-09-16 MED ORDER — ACETAMINOPHEN 325 MG PO TABS
650.0000 mg | ORAL_TABLET | Freq: Once | ORAL | Status: AC
  Administered 2011-09-16: 650 mg via ORAL
  Filled 2011-09-16: qty 2

## 2011-09-16 MED ORDER — ACETAMINOPHEN ER 650 MG PO TBCR: 650.0000 mg | EXTENDED_RELEASE_TABLET | ORAL | Status: DC

## 2011-09-16 MED ORDER — DIPHENHYDRAMINE HCL 25 MG PO CAPS
25.0000 mg | ORAL_CAPSULE | ORAL | Status: DC
  Administered 2011-09-16: 25 mg via ORAL
  Filled 2011-09-16: qty 1

## 2011-09-16 MED ORDER — SODIUM CHLORIDE 0.9 % IV SOLN
5.0000 mg/kg | Freq: Once | INTRAVENOUS | Status: AC
  Administered 2011-09-16: 400 mg via INTRAVENOUS
  Filled 2011-09-16: qty 40

## 2011-09-16 MED ORDER — INFLIXIMAB 100 MG IV SOLR
5.0000 mg/kg | INTRAVENOUS | Status: DC
  Filled 2011-09-16: qty 40

## 2011-09-16 MED ORDER — EPOETIN ALFA 10000 UNIT/ML IJ SOLN
10000.0000 [IU] | INTRAMUSCULAR | Status: DC
  Administered 2011-09-16: 10000 [IU] via SUBCUTANEOUS
  Filled 2011-09-16 (×2): qty 1

## 2011-09-17 LAB — IRON AND TIBC: UIBC: 85 ug/dL — ABNORMAL LOW (ref 125–400)

## 2011-09-30 ENCOUNTER — Encounter (HOSPITAL_COMMUNITY): Payer: Self-pay

## 2011-09-30 ENCOUNTER — Encounter (HOSPITAL_COMMUNITY)
Admission: RE | Admit: 2011-09-30 | Discharge: 2011-09-30 | Disposition: A | Discharge status: Home / Self Care | Payer: Medicare Other | Source: Ambulatory Visit | Attending: Nephrology | Admitting: Nephrology

## 2011-09-30 VITALS — BP 140/84 | HR 92 | Temp 98.6°F | Resp 18

## 2011-09-30 DIAGNOSIS — D649 Anemia, unspecified: Secondary | ICD-10-CM | POA: Insufficient documentation

## 2011-10-14 ENCOUNTER — Encounter (HOSPITAL_COMMUNITY)
Admission: RE | Admit: 2011-10-14 | Discharge: 2011-10-14 | Disposition: A | Discharge status: Home / Self Care | Payer: Medicare Other | Source: Ambulatory Visit | Attending: Nephrology | Admitting: Nephrology

## 2011-10-14 ENCOUNTER — Encounter (HOSPITAL_COMMUNITY): Payer: Self-pay

## 2011-10-14 VITALS — BP 105/69 | HR 82 | Temp 97.9°F | Resp 18 | Ht 76.0 in | Wt 173.0 lb

## 2011-10-14 DIAGNOSIS — D649 Anemia, unspecified: Secondary | ICD-10-CM

## 2011-10-14 LAB — HEMOGLOBIN: Hemoglobin: 10.9 g/dL — ABNORMAL LOW (ref 13.0–17.0)

## 2011-10-14 MED ORDER — EPOETIN ALFA 10000 UNIT/ML IJ SOLN
10000.0000 [IU] | INTRAMUSCULAR | Status: DC
  Administered 2011-10-14: 10000 [IU] via SUBCUTANEOUS
  Filled 2011-10-14: qty 1

## 2011-10-28 ENCOUNTER — Encounter (HOSPITAL_COMMUNITY): Payer: Self-pay

## 2011-10-28 ENCOUNTER — Encounter (HOSPITAL_COMMUNITY)
Admission: RE | Admit: 2011-10-28 | Discharge: 2011-10-28 | Disposition: A | Discharge status: Home / Self Care | Payer: Medicare Other | Source: Ambulatory Visit | Attending: Nephrology | Admitting: Nephrology

## 2011-10-28 VITALS — BP 129/76 | HR 95 | Temp 97.3°F | Resp 18 | Ht 76.0 in | Wt 173.0 lb

## 2011-10-28 DIAGNOSIS — K5090 Crohn's disease, unspecified, without complications: Secondary | ICD-10-CM | POA: Insufficient documentation

## 2011-10-28 DIAGNOSIS — D649 Anemia, unspecified: Secondary | ICD-10-CM | POA: Insufficient documentation

## 2011-10-28 LAB — HEMOGLOBIN: Hemoglobin: 11 g/dL — ABNORMAL LOW (ref 13.0–17.0)

## 2011-10-28 MED ORDER — EPOETIN ALFA 10000 UNIT/ML IJ SOLN
10000.0000 [IU] | INTRAMUSCULAR | Status: DC
  Administered 2011-10-28: 10000 [IU] via SUBCUTANEOUS
  Filled 2011-10-28: qty 1

## 2011-11-07 ENCOUNTER — Encounter (HOSPITAL_COMMUNITY): Payer: Medicare Other

## 2011-11-09 NOTE — Patient Instructions (Signed)
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  CURRENT THERAPY: watchful observation from Hematology standpoint; transfusion prn; Procrit injection by Dr. Elmarie Shiley. FOLLOW UP:  CBC here at the [facility] in about 4 and then 8 months.  Return visit in about 1 year.  In the future, if Hgb significantly worsens or also very low WBC and platelet, we may consider diagnostic bone marrow biopsy.  A bone marrow at this time is premature at this time and will have low clinical utility.

## 2011-11-10 ENCOUNTER — Ambulatory Visit: Payer: Medicare Other | Admitting: Oncology

## 2011-11-10 ENCOUNTER — Other Ambulatory Visit: Payer: Medicare Other

## 2011-11-11 ENCOUNTER — Encounter (HOSPITAL_COMMUNITY): Payer: Self-pay

## 2011-11-11 ENCOUNTER — Encounter (HOSPITAL_COMMUNITY)
Admission: RE | Admit: 2011-11-11 | Discharge: 2011-11-11 | Disposition: A | Discharge status: Home / Self Care | Payer: Medicare Other | Source: Ambulatory Visit | Attending: Gastroenterology | Admitting: Gastroenterology

## 2011-11-11 VITALS — BP 137/82 | HR 74 | Temp 97.6°F | Resp 19 | Ht 76.0 in | Wt 170.2 lb

## 2011-11-11 DIAGNOSIS — D649 Anemia, unspecified: Secondary | ICD-10-CM

## 2011-11-11 DIAGNOSIS — K5090 Crohn's disease, unspecified, without complications: Secondary | ICD-10-CM

## 2011-11-11 LAB — HEMOGLOBIN: Hemoglobin: 11 g/dL — ABNORMAL LOW (ref 13.0–17.0)

## 2011-11-11 MED ORDER — DIPHENHYDRAMINE HCL 25 MG PO CAPS
25.0000 mg | ORAL_CAPSULE | ORAL | Status: DC
  Administered 2011-11-11: 25 mg via ORAL
  Filled 2011-11-11: qty 1

## 2011-11-11 MED ORDER — ACETAMINOPHEN 325 MG PO TABS
650.0000 mg | ORAL_TABLET | Freq: Once | ORAL | Status: AC
  Administered 2011-11-11: 650 mg via ORAL
  Filled 2011-11-11: qty 2

## 2011-11-11 MED ORDER — INFLIXIMAB 100 MG IV SOLR
5.0000 mg/kg | Freq: Once | INTRAVENOUS | Status: AC
  Administered 2011-11-11: 400 mg via INTRAVENOUS
  Filled 2011-11-11: qty 40

## 2011-11-11 MED ORDER — SODIUM CHLORIDE 0.9 % IV SOLN
INTRAVENOUS | Status: AC
  Administered 2011-11-11: 250 mL via INTRAVENOUS

## 2011-11-11 MED ORDER — EPOETIN ALFA 10000 UNIT/ML IJ SOLN
10000.0000 [IU] | INTRAMUSCULAR | Status: DC
  Administered 2011-11-11: 10000 [IU] via SUBCUTANEOUS
  Filled 2011-11-11: qty 1

## 2011-11-12 ENCOUNTER — Other Ambulatory Visit: Payer: Self-pay | Admitting: Gastroenterology

## 2011-11-12 LAB — IRON AND TIBC
Iron: 61 ug/dL (ref 42–135)
Saturation Ratios: 28 % (ref 20–55)
TIBC: 219 ug/dL (ref 215–435)
UIBC: 158 ug/dL (ref 125–400)

## 2011-11-14 NOTE — Progress Notes (Signed)
No show.  Reschedule prn.

## 2011-11-19 ENCOUNTER — Other Ambulatory Visit (HOSPITAL_BASED_OUTPATIENT_CLINIC_OR_DEPARTMENT_OTHER): Payer: Medicare Other | Admitting: Lab

## 2011-11-19 ENCOUNTER — Ambulatory Visit (HOSPITAL_BASED_OUTPATIENT_CLINIC_OR_DEPARTMENT_OTHER): Payer: Medicare Other | Admitting: Oncology

## 2011-11-19 VITALS — BP 138/85 | HR 81 | Temp 97.6°F | Resp 20 | Ht 64.0 in | Wt 170.8 lb

## 2011-11-19 DIAGNOSIS — N289 Disorder of kidney and ureter, unspecified: Secondary | ICD-10-CM

## 2011-11-19 DIAGNOSIS — F319 Bipolar disorder, unspecified: Secondary | ICD-10-CM

## 2011-11-19 DIAGNOSIS — D649 Anemia, unspecified: Secondary | ICD-10-CM

## 2011-11-19 DIAGNOSIS — K5090 Crohn's disease, unspecified, without complications: Secondary | ICD-10-CM

## 2011-11-19 LAB — CBC WITH DIFFERENTIAL/PLATELET
BASO%: 0.5 % (ref 0.0–2.0)
Basophils Absolute: 0 10*3/uL (ref 0.0–0.1)
EOS%: 4.9 % (ref 0.0–7.0)
Eosinophils Absolute: 0.4 10*3/uL (ref 0.0–0.5)
HCT: 35.6 % — ABNORMAL LOW (ref 38.4–49.9)
HGB: 11.4 g/dL — ABNORMAL LOW (ref 13.0–17.1)
LYMPH%: 33.5 % (ref 14.0–49.0)
MCH: 33.5 pg — ABNORMAL HIGH (ref 27.2–33.4)
MCHC: 32.1 g/dL (ref 32.0–36.0)
MCV: 104.5 fL — ABNORMAL HIGH (ref 79.3–98.0)
MONO#: 0.5 10*3/uL (ref 0.1–0.9)
MONO%: 6.3 % (ref 0.0–14.0)
NEUT#: 4.5 10*3/uL (ref 1.5–6.5)
NEUT%: 54.8 % (ref 39.0–75.0)
Platelets: 157 10*3/uL (ref 140–400)
RBC: 3.41 10*6/uL — ABNORMAL LOW (ref 4.20–5.82)
RDW: 14.1 % (ref 11.0–14.6)
WBC: 8.2 10*3/uL (ref 4.0–10.3)
lymph#: 2.8 10*3/uL (ref 0.9–3.3)

## 2011-11-19 LAB — COMPREHENSIVE METABOLIC PANEL (CC13)
ALT: 24 U/L (ref 0–55)
AST: 22 U/L (ref 5–34)
Calcium: 9.2 mg/dL (ref 8.4–10.4)
Chloride: 110 mEq/L — ABNORMAL HIGH (ref 98–107)
Creatinine: 1.8 mg/dL — ABNORMAL HIGH (ref 0.7–1.3)
Total Bilirubin: 0.4 mg/dL (ref 0.20–1.20)

## 2011-11-19 NOTE — Patient Instructions (Addendum)
1. Diagnosis: Anemia of chronic inflammation from Crohn's disease and also from chronic kidney disease. 2. Treatment: Erythropoietin hormone injection with your nephrologist. Your hemoglobin has been quite stable above 11. 3. Disposition: Discharged from the [facility] as this no indication for long-term followup with the [facility]. Please have blood counts drawn at least once twice a year with your primary doctor or your nephrologist. In the future, if your blood count worsens, we can evaluate again. Thank you.

## 2011-11-19 NOTE — Progress Notes (Signed)
[facility]  Telephone:([phone] [phone]) [phone]   OFFICE PROGRESS NOTE   Cc:  REED, TIFFANY, DO  DIAGNOSIS: Normocytic anemia from chronic inflammation (Crohn's) and renal insufficiency.   CURRENT THERAPY: watchful observation; transfusion prn; Procrit injection by his nephrologist Dr. Elmarie Shiley.  INTERVAL HISTORY: Brent [name] 66 y.o. male returns for regular follow up by himself.  He reports feeling relatively well. He has chronic Crohn's disease and takes Remicade and Questran. He does not have frequent outbreak. He still receives Procrit every 2 weeks.  Patient denies fever, anorexia, weight loss, fatigue, headache, visual changes, confusion, drenching night sweats, palpable lymph node swelling, mucositis, odynophagia, dysphagia, nausea vomiting, jaundice, chest pain, palpitation, shortness of breath, dyspnea on exertion, productive cough, gum bleeding, epistaxis, hematemesis, hemoptysis, abdominal pain, abdominal swelling, early satiety, melena, hematochezia, hematuria, skin rash, spontaneous bleeding, joint swelling, joint pain, heat or cold intolerance, bowel bladder incontinence, back pain, focal motor weakness, paresthesia, depression, suicidal or homicidal ideation, feeling hopelessness.   Past Medical History  Diagnosis Date  . Crohn's   . Anxiety   . Depression   . Renal insufficiency   . Anemia     Past Surgical History  Procedure Date  . Small intestine surgery     x 2  . Cholecystectomy 07-12-2010    Current Outpatient Prescriptions  Medication Sig Dispense Refill  . Acetaminophen 650 MG TABS Take 650 mg by mouth once. Prior to infusion  1 tablet  0  . amLODipine (NORVASC) 10 MG tablet Take 10 mg by mouth Nightly.        Marland Kitchen ammonium lactate (AMLACTIN) 12 % lotion Apply 1 application topically as needed.        . Calcium Carbonate-Vit D-Min 600-400 MG-UNIT TABS Take 1 tablet by mouth 2 (two) times daily.        . cholestyramine (QUESTRAN)  4 GM/DOSE powder take 1 scoopful DISSOLVED IN WATER once daily  378 g  3  . diphenhydrAMINE (BENADRYL) 25 mg capsule Take 1-2 capsules (25-50 mg total) by mouth once. Prior to infusion  2 capsule  0  . divalproex (DEPAKOTE) 500 MG EC tablet Take 1,500 mg by mouth daily.       Marland Kitchen epoetin alfa (PROCRIT) 75102 UNIT/ML injection Inject 10,000 Units into the skin every 14 (fourteen) days.       . ferrous sulfate 325 (65 FE) MG tablet Take 325 mg by mouth 2 (two) times daily.        Marland Kitchen inFLIXimab (REMICADE) 100 MG injection Infuse Remicade IV schedule 1 39m/kg every 8 weeks Premedicate with Tylenol 500-6536mby mouth and Benadryl 25-5050my mouth prior to infusion. Last PPD was on 12/2009.   1 each  6  . Loperamide HCl (LOPERAMIDE A-D PO) Take 1 capsule by mouth as needed.        . Lutein 20 MG TABS Take 1 tablet by mouth daily.        . magnesium oxide (MAG-OX) 400 MG tablet Take 400 mg by mouth daily.        . metoprolol succinate (TOPROL-XL) 25 MG 24 hr tablet Take 25 mg by mouth daily.        . Multiple Vitamin (MULTIVITAMIN) tablet Take 1 tablet by mouth daily.        . OMarland KitchenANZapine (ZYPREXA) 10 MG tablet Take 10 mg by mouth at bedtime.        . oMarland Kitcheneprazole (PRILOSEC) 20 MG capsule Take 20 mg by mouth  daily.        . oxybutynin (DITROPAN XL) 15 MG 24 hr tablet Take 15 mg by mouth daily.        . vitamin E 400 UNIT capsule Take 400 Units by mouth daily.          ALLERGIES:  is allergic to azathioprine.  REVIEW OF SYSTEMS:  The rest of the 14-point review of system was negative.   Filed Vitals:   11/19/11 1020  BP: 138/85  Pulse: 81  Temp: 97.6 F (36.4 C)  Resp: 20   Wt Readings from Last 3 Encounters:  11/19/11 170 lb 12.8 oz (77.474 kg)  11/11/11 170 lb 4 oz (77.225 kg)  10/28/11 173 lb (78.472 kg)   ECOG Performance status: 0  PHYSICAL EXAMINATION:   General:  well-nourished man, in no acute distress.  Eyes:  no scleral icterus.  ENT:  There were no oropharyngeal lesions.  Neck  was without thyromegaly.  Lymphatics:  Negative cervical, supraclavicular or axillary adenopathy.  Respiratory: lungs were clear bilaterally without wheezing or crackles.  Cardiovascular:  Regular rate and rhythm, S1/S2, without murmur, rub or gallop.  There was no pedal edema.  GI:  abdomen was soft, flat, nontender, nondistended, without organomegaly.  Muscoloskeletal:  no spinal tenderness of palpation of vertebral spine.  Skin exam was without echymosis, petichae.  Neuro exam was nonfocal.  Patient was able to get on and off exam table without assistance.  Gait was normal.  Patient was alerted and oriented.  Attention was good.   Language was appropriate.  Mood was normal without depression.  Speech was not pressured.  Thought content was not tangential.      LABORATORY/RADIOLOGY DATA:  Lab Results  Component Value Date   WBC 8.2 11/19/2011   HGB 11.4* 11/19/2011   HCT 35.6* 11/19/2011   PLT 157 11/19/2011   GLUCOSE 80 11/19/2011   ALKPHOS 94 11/19/2011   ALT 24 11/19/2011   AST 22 11/19/2011   NA 143 11/19/2011   K 3.9 11/19/2011   CL 110* 11/19/2011   CREATININE 1.8* 11/19/2011   BUN 15.0 11/19/2011   CO2 23 11/19/2011    ASSESSMENT AND PLAN:   1. Chronic anemia. This is mostly likely anemia of chronic inflammation from his Crohn's disease and anemia of chronic renal insufficiency. His Crohn's disease is under control.  He has been on Procrit with stable hemoglobin.  2. Renal insufficiency most likely secondary to chronic hypertension. He is followed by Dr. Graylon Gunning.  3. History of Crohn's. He is on infliximab, omeprazole and Questran per GI physician. He is due to have another routine surveillance colonoscopy by GI in the next few months. 4. Bipolar disease. He is on valproic acid. This is stable on exam today 5. Disposition: As his hemoglobin has been stable, and there is no thrombocytopenia or leukocytopenia, there is no indication for further work up from hematology standpoint. I discharge  her from the [facility]. His CBC can be followed by his PCP and his nephrologist.  In the future, if he develops severe anemia or pancytopenia, and our service is always available at the [facility]. [name] was in agreement with discharge from the [facility]. Thank you for this referral.    The length of time of the face-to-face encounter was 21mnutes. More than 50% of time was spent counseling and coordination of care.

## 2011-11-21 ENCOUNTER — Encounter (HOSPITAL_COMMUNITY)
Admission: RE | Admit: 2011-11-21 | Discharge: 2011-11-21 | Disposition: A | Discharge status: Home / Self Care | Payer: Medicare Other | Source: Ambulatory Visit | Attending: Nephrology | Admitting: Nephrology

## 2011-11-21 ENCOUNTER — Encounter (HOSPITAL_COMMUNITY): Payer: Self-pay

## 2011-11-21 VITALS — BP 126/78 | HR 96 | Temp 98.1°F | Resp 18

## 2011-11-21 DIAGNOSIS — D649 Anemia, unspecified: Secondary | ICD-10-CM

## 2011-11-21 MED ORDER — EPOETIN ALFA 10000 UNIT/ML IJ SOLN
10000.0000 [IU] | INTRAMUSCULAR | Status: DC
  Administered 2011-11-21: 10000 [IU] via SUBCUTANEOUS
  Filled 2011-11-21: qty 1

## 2011-12-05 ENCOUNTER — Encounter (HOSPITAL_COMMUNITY)
Admission: RE | Admit: 2011-12-05 | Discharge: 2011-12-05 | Disposition: A | Discharge status: Home / Self Care | Payer: Medicare Other | Source: Ambulatory Visit | Attending: Nephrology | Admitting: Nephrology

## 2011-12-05 ENCOUNTER — Encounter (HOSPITAL_COMMUNITY): Payer: Self-pay

## 2011-12-05 VITALS — BP 122/78 | HR 88 | Temp 97.9°F | Resp 18

## 2011-12-05 DIAGNOSIS — D649 Anemia, unspecified: Secondary | ICD-10-CM | POA: Insufficient documentation

## 2011-12-05 LAB — HEMOGLOBIN: Hemoglobin: 12.3 g/dL — ABNORMAL LOW (ref 13.0–17.0)

## 2011-12-11 NOTE — Progress Notes (Signed)
Received office notes from Dr. Posey Pronto @ [facility]; forwarded to Dr. Lamonte Sakai.

## 2011-12-19 ENCOUNTER — Encounter (HOSPITAL_COMMUNITY): Payer: Self-pay

## 2011-12-19 ENCOUNTER — Encounter (HOSPITAL_COMMUNITY)
Admission: RE | Admit: 2011-12-19 | Discharge: 2011-12-19 | Disposition: A | Discharge status: Home / Self Care | Payer: Medicare Other | Source: Ambulatory Visit | Attending: Nephrology | Admitting: Nephrology

## 2011-12-19 VITALS — BP 125/78 | HR 96 | Temp 98.4°F | Resp 18

## 2011-12-19 DIAGNOSIS — D649 Anemia, unspecified: Secondary | ICD-10-CM

## 2011-12-19 LAB — IRON AND TIBC
Saturation Ratios: 29 % (ref 20–55)
UIBC: 157 ug/dL (ref 125–400)

## 2012-01-02 ENCOUNTER — Encounter (HOSPITAL_COMMUNITY): Payer: Medicare Other

## 2012-01-05 ENCOUNTER — Telehealth: Payer: Self-pay

## 2012-01-05 ENCOUNTER — Ambulatory Visit (INDEPENDENT_AMBULATORY_CARE_PROVIDER_SITE_OTHER): Payer: Medicare Other | Admitting: Gastroenterology

## 2012-01-05 ENCOUNTER — Other Ambulatory Visit: Payer: Self-pay

## 2012-01-05 DIAGNOSIS — K5010 Crohn's disease of large intestine without complications: Secondary | ICD-10-CM

## 2012-01-05 DIAGNOSIS — K5090 Crohn's disease, unspecified, without complications: Secondary | ICD-10-CM

## 2012-01-05 MED ORDER — SODIUM CHLORIDE 0.9 % IV SOLN: 5.0000 mg/kg | INTRAVENOUS | Status: AC

## 2012-01-05 NOTE — Telephone Encounter (Signed)
Message copied by Barron Alvine on Mon Jan 05, 2012  8:18 AM ------      Message from: Barron Alvine      Created: Thu Jan 02, 2011  8:06 AM       Pt needs tb skin test

## 2012-01-05 NOTE — Telephone Encounter (Signed)
Needs remicade orders as well as TB  Pt will be in today for the TB skin test Orders to be added to [facility]

## 2012-01-06 ENCOUNTER — Encounter (HOSPITAL_COMMUNITY): Payer: Self-pay

## 2012-01-06 ENCOUNTER — Encounter (HOSPITAL_COMMUNITY)
Admission: RE | Admit: 2012-01-06 | Discharge: 2012-01-06 | Disposition: A | Discharge status: Home / Self Care | Payer: Medicare Other | Source: Ambulatory Visit | Attending: Gastroenterology | Admitting: Gastroenterology

## 2012-01-06 ENCOUNTER — Other Ambulatory Visit: Payer: Self-pay

## 2012-01-06 VITALS — BP 130/81 | HR 86 | Temp 98.1°F | Resp 18 | Wt 168.6 lb

## 2012-01-06 DIAGNOSIS — K5010 Crohn's disease of large intestine without complications: Secondary | ICD-10-CM | POA: Insufficient documentation

## 2012-01-06 DIAGNOSIS — D649 Anemia, unspecified: Secondary | ICD-10-CM | POA: Insufficient documentation

## 2012-01-06 MED ORDER — ACETAMINOPHEN 325 MG PO TABS
650.0000 mg | ORAL_TABLET | ORAL | Status: DC
  Administered 2012-01-06: 650 mg via ORAL
  Filled 2012-01-06: qty 2

## 2012-01-06 MED ORDER — SODIUM CHLORIDE 0.9 % IV SOLN
5.0000 mg/kg | INTRAVENOUS | Status: DC
  Administered 2012-01-06: 400 mg via INTRAVENOUS
  Filled 2012-01-06: qty 40

## 2012-01-06 MED ORDER — DIPHENHYDRAMINE HCL 25 MG PO CAPS
25.0000 mg | ORAL_CAPSULE | ORAL | Status: DC
  Administered 2012-01-06: 25 mg via ORAL
  Filled 2012-01-06: qty 1

## 2012-01-06 MED ORDER — SODIUM CHLORIDE 0.9 % IV SOLN
INTRAVENOUS | Status: DC
  Administered 2012-01-06: 15:00:00 via INTRAVENOUS

## 2012-01-07 LAB — TB SKIN TEST
Induration: 0 mm
TB Skin Test: NEGATIVE

## 2012-01-16 ENCOUNTER — Encounter (HOSPITAL_COMMUNITY): Admission: RE | Admit: 2012-01-16 | Payer: Medicare Other | Source: Ambulatory Visit

## 2012-01-16 ENCOUNTER — Encounter (HOSPITAL_COMMUNITY): Payer: Medicare Other

## 2012-01-20 ENCOUNTER — Encounter (HOSPITAL_COMMUNITY): Payer: Self-pay

## 2012-01-20 ENCOUNTER — Encounter (HOSPITAL_COMMUNITY)
Admission: RE | Admit: 2012-01-20 | Discharge: 2012-01-20 | Disposition: A | Discharge status: Home / Self Care | Payer: Medicare Other | Source: Ambulatory Visit | Attending: Nephrology | Admitting: Nephrology

## 2012-01-20 VITALS — BP 151/86 | HR 97 | Temp 97.0°F | Resp 18 | Ht 76.0 in

## 2012-01-20 DIAGNOSIS — D649 Anemia, unspecified: Secondary | ICD-10-CM

## 2012-01-20 LAB — IRON AND TIBC: Saturation Ratios: 37 % (ref 20–55)

## 2012-01-20 LAB — HEMOGLOBIN: Hemoglobin: 11 g/dL — ABNORMAL LOW (ref 13.0–17.0)

## 2012-01-20 LAB — FERRITIN: Ferritin: 860 ng/mL — ABNORMAL HIGH (ref 22–322)

## 2012-01-20 MED ORDER — EPOETIN ALFA 10000 UNIT/ML IJ SOLN: 10000.0000 [IU] | INTRAMUSCULAR | Status: DC

## 2012-01-20 MED ORDER — EPOETIN ALFA 10000 UNIT/ML IJ SOLN
10000.0000 [IU] | INTRAMUSCULAR | Status: DC
  Administered 2012-01-20: 10000 [IU] via SUBCUTANEOUS
  Filled 2012-01-20: qty 1

## 2012-01-27 ENCOUNTER — Encounter (HOSPITAL_COMMUNITY): Payer: Medicare Other

## 2012-01-30 ENCOUNTER — Encounter (HOSPITAL_COMMUNITY): Payer: Medicare Other

## 2012-02-13 ENCOUNTER — Encounter (HOSPITAL_COMMUNITY)
Admission: RE | Admit: 2012-02-13 | Discharge: 2012-02-13 | Disposition: A | Discharge status: Home / Self Care | Payer: Medicare Other | Source: Ambulatory Visit | Attending: Nephrology | Admitting: Nephrology

## 2012-02-13 ENCOUNTER — Encounter (HOSPITAL_COMMUNITY): Payer: Self-pay

## 2012-02-13 VITALS — BP 132/86 | HR 97 | Temp 97.5°F | Resp 18

## 2012-02-13 DIAGNOSIS — I129 Hypertensive chronic kidney disease with stage 1 through stage 4 chronic kidney disease, or unspecified chronic kidney disease: Secondary | ICD-10-CM | POA: Insufficient documentation

## 2012-02-13 DIAGNOSIS — N183 Chronic kidney disease, stage 3 unspecified: Secondary | ICD-10-CM | POA: Insufficient documentation

## 2012-02-13 DIAGNOSIS — D649 Anemia, unspecified: Secondary | ICD-10-CM | POA: Insufficient documentation

## 2012-02-13 DIAGNOSIS — K5090 Crohn's disease, unspecified, without complications: Secondary | ICD-10-CM | POA: Insufficient documentation

## 2012-02-13 LAB — HEMOGLOBIN AND HEMATOCRIT, BLOOD: HCT: 32.7 % — ABNORMAL LOW (ref 39.0–52.0)

## 2012-02-13 MED ORDER — EPOETIN ALFA 10000 UNIT/ML IJ SOLN
10000.0000 [IU] | INTRAMUSCULAR | Status: DC
  Administered 2012-02-13: 10000 [IU] via SUBCUTANEOUS
  Filled 2012-02-13: qty 1

## 2012-02-14 LAB — IRON AND TIBC
Iron: 67 ug/dL (ref 42–135)
UIBC: 149 ug/dL (ref 125–400)

## 2012-02-19 ENCOUNTER — Ambulatory Visit: Payer: Medicare Other | Admitting: Gastroenterology

## 2012-02-27 ENCOUNTER — Telehealth: Payer: Self-pay | Admitting: Gastroenterology

## 2012-02-27 NOTE — Telephone Encounter (Signed)
Message copied by Irwin Brakeman I on Fri Feb 27, 2012  8:16 AM ------      Message from: Barron Alvine      Created: Thu Feb 19, 2012  2:16 PM       Do not bill

## 2012-03-02 ENCOUNTER — Encounter (HOSPITAL_COMMUNITY): Payer: Medicare Other

## 2012-03-02 ENCOUNTER — Encounter (HOSPITAL_COMMUNITY)
Admission: RE | Admit: 2012-03-02 | Discharge: 2012-03-02 | Disposition: A | Discharge status: Home / Self Care | Payer: Medicare Other | Source: Ambulatory Visit | Attending: Gastroenterology | Admitting: Gastroenterology

## 2012-03-02 VITALS — BP 126/82 | HR 90 | Temp 98.2°F | Resp 18 | Ht 76.0 in | Wt 167.4 lb

## 2012-03-02 DIAGNOSIS — K5010 Crohn's disease of large intestine without complications: Secondary | ICD-10-CM | POA: Insufficient documentation

## 2012-03-02 DIAGNOSIS — D649 Anemia, unspecified: Secondary | ICD-10-CM | POA: Insufficient documentation

## 2012-03-02 MED ORDER — ACETAMINOPHEN 325 MG PO TABS
650.0000 mg | ORAL_TABLET | ORAL | Status: DC
  Administered 2012-03-02: 650 mg via ORAL
  Filled 2012-03-02: qty 2

## 2012-03-02 MED ORDER — DIPHENHYDRAMINE HCL 25 MG PO CAPS
25.0000 mg | ORAL_CAPSULE | ORAL | Status: DC
  Administered 2012-03-02: 25 mg via ORAL
  Filled 2012-03-02: qty 1

## 2012-03-02 MED ORDER — SODIUM CHLORIDE 0.9 % IV SOLN
5.0000 mg/kg | INTRAVENOUS | Status: DC
  Administered 2012-03-02: 400 mg via INTRAVENOUS
  Filled 2012-03-02: qty 40

## 2012-03-02 MED ORDER — SODIUM CHLORIDE 0.9 % IV SOLN
INTRAVENOUS | Status: DC
  Administered 2012-03-02: 14:00:00 via INTRAVENOUS

## 2012-03-12 ENCOUNTER — Encounter (HOSPITAL_COMMUNITY): Payer: Self-pay

## 2012-03-12 ENCOUNTER — Encounter (HOSPITAL_COMMUNITY)
Admission: RE | Admit: 2012-03-12 | Discharge: 2012-03-12 | Disposition: A | Discharge status: Home / Self Care | Payer: Medicare Other | Source: Ambulatory Visit | Attending: Nephrology | Admitting: Nephrology

## 2012-03-12 VITALS — BP 142/90 | HR 94 | Temp 97.1°F | Resp 18

## 2012-03-12 DIAGNOSIS — D649 Anemia, unspecified: Secondary | ICD-10-CM

## 2012-03-12 LAB — IRON AND TIBC
Iron: 86 ug/dL (ref 42–135)
Saturation Ratios: 39 % (ref 20–55)
TIBC: 223 ug/dL (ref 215–435)
UIBC: 137 ug/dL (ref 125–400)

## 2012-03-22 ENCOUNTER — Encounter: Payer: Self-pay | Admitting: Gastroenterology

## 2012-03-22 ENCOUNTER — Ambulatory Visit (INDEPENDENT_AMBULATORY_CARE_PROVIDER_SITE_OTHER): Payer: Medicare Other | Admitting: Gastroenterology

## 2012-03-22 VITALS — BP 132/78 | HR 84 | Ht 76.0 in | Wt 170.6 lb

## 2012-03-22 DIAGNOSIS — K5090 Crohn's disease, unspecified, without complications: Secondary | ICD-10-CM

## 2012-03-22 NOTE — Progress Notes (Signed)
Review of gastrointestinal problems:  1. crohn's diagnosed in 1970. He had abd surgeries (one in 70's, one in 80's). He believes only ileum was removed.Colonoscopy with Dr. Lajoyce Corners in mid 2008: this showed a wide-open IC valve, biopsies of ileum and colon showed chronic, active colitis however macroscopically he described it as a fairly normal-appearing. Colonoscopy Dr. Ardis Hughs 12/11 found no colitis, but actively ulcerated and inflamed neoterminal ileum (while he was on prednisone). 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 July, 2012: Doing very well on maintenance Remicade, cholestyramine.  November 2013 TB skin testing was negative  Remicade started February 2012 at 5 mg per kilogram  Cholestyramine started March, 2012; takes one per day (Jan 2014) 2. acute cholecystitis May 2012. Laparoscopic cholecystectomy   HPI: This is a very pleasant 67 year old man whom I last saw about one year ago.  Had acute diarrhea last week. Lasted for about a week.  Going 3-4 times a day, watery, non-bloody.  No vomiting.  Not much abd pain.  No preceeding antibiotics.. Things returned to normal.  One of his friends had same diarhreal illness around the same time.  His normal is one solid brown bm per day.  Still on remicade every 8 weeks.  Past Medical History  Diagnosis Date  . Crohn's   . Anxiety   . Depression   . Renal insufficiency   . Anemia     Past Surgical History  Procedure Date  . Small intestine surgery     x 2  . Cholecystectomy 07-12-2010    Current Outpatient Prescriptions  Medication Sig Dispense Refill  . Acetaminophen 650 MG TABS Take 650 mg by mouth once. Prior to infusion  1 tablet  0  . amLODipine (NORVASC) 10 MG tablet Take 10 mg by mouth Nightly.        Marland Kitchen ammonium lactate (AMLACTIN) 12 % lotion Apply 1 application topically as needed.        . Calcium Carbonate-Vit D-Min 600-400 MG-UNIT TABS Take 1 tablet by mouth 2 (two) times daily.        .  cholestyramine (QUESTRAN) 4 GM/DOSE powder take 1 scoopful DISSOLVED IN WATER once daily  378 g  3  . diphenhydrAMINE (BENADRYL) 25 mg capsule Take 1-2 capsules (25-50 mg total) by mouth once. Prior to infusion  2 capsule  0  . divalproex (DEPAKOTE) 500 MG EC tablet Take 1,500 mg by mouth daily.       Marland Kitchen epoetin alfa (PROCRIT) 46962 UNIT/ML injection Inject 10,000 Units into the skin every 14 (fourteen) days.       . ferrous sulfate 325 (65 FE) MG tablet Take 325 mg by mouth 2 (two) times daily.        Marland Kitchen inFLIXimab (REMICADE) 100 MG injection Infuse Remicade IV schedule 1 24m/kg every 8 weeks Premedicate with Tylenol 500-6519mby mouth and Benadryl 25-5082my mouth prior to infusion. Last PPD was on 12/2009.   1 each  6  . Loperamide HCl (LOPERAMIDE A-D PO) Take 1 capsule by mouth as needed.        . Lutein 20 MG TABS Take 1 tablet by mouth daily.        . magnesium oxide (MAG-OX) 400 MG tablet Take 400 mg by mouth daily.        . metoprolol succinate (TOPROL-XL) 25 MG 24 hr tablet Take 25 mg by mouth daily.        . Multiple Vitamin (MULTIVITAMIN) tablet Take 1 tablet by mouth daily.        .Marland Kitchen  OLANZapine (ZYPREXA) 10 MG tablet Take 10 mg by mouth at bedtime.        Marland Kitchen omeprazole (PRILOSEC) 20 MG capsule Take 20 mg by mouth daily.        Marland Kitchen oxybutynin (DITROPAN XL) 15 MG 24 hr tablet Take 15 mg by mouth daily.        . Tamsulosin HCl (FLOMAX) 0.4 MG CAPS Take 0.4 mg by mouth daily.      . vitamin E 400 UNIT capsule Take 400 Units by mouth daily.         Current Facility-Administered Medications  Medication Dose Route Frequency Provider Last Rate Last Dose  . inFLIXimab (REMICADE) 5 mg/kg = 400 mg in sodium chloride 0.9 % 250 mL infusion  5 mg/kg Intravenous Q8 weeks Milus Banister, MD        Allergies as of 03/22/2012 - Review Complete 03/22/2012  Allergen Reaction Noted  . Azathioprine      Family History  Problem Relation Age of Onset  . Diabetes Mother     maternal grandmother  .  Pneumonia Maternal Grandmother   . Emphysema Father   . Colon cancer Neg Hx     History   Social History  . Marital Status: Divorced    Spouse Name: N/A    Number of Children: 1  . Years of Education: N/A   Occupational History  . retired    Social History Main Topics  . Smoking status: Current Every Day Smoker -- 1.0 packs/day for 49 years    Types: Cigarettes  . Smokeless tobacco: Never Used  . Alcohol Use: No  . Drug Use: No  . Sexually Active: Not on file   Other Topics Concern  . Not on file   Social History Narrative  . No narrative on file      Physical Exam: BP 132/78  Pulse 84  Ht 6' 4"  (1.93 m)  Wt 170 lb 9.6 oz (77.384 kg)  BMI 20.77 kg/m2 Constitutional: generally well-appearing Psychiatric: alert and oriented x3 Abdomen: soft, nontender, nondistended, no obvious ascites, no peritoneal signs, normal bowel sounds     Assessment and plan: 67 y.o. male with Crohn's disease  I think he indeed had some sort of an infectious illness last week. One of his friends had similar diarrheal symptoms. He is back to his normal now. He had no preceding antibiotics.  He is currently on solo therapy with Remicade every 8 weeks and doing well. He augments with Questran 1 pack daily. He will continue this regimen for the next 6 months and return to see me around that time.

## 2012-03-22 NOTE — Patient Instructions (Addendum)
One of your biggest health concerns is your smoking.  This increases your risk for most cancers and serious cardiovascular diseases such as strokes, heart attacks.  You should try your best to stop.  If you need assistance, please contact your PCP or Smoking Cessation Class at [facility] [phone]) or [facility] (1-800-QUIT-NOW). SMOKING IS TERRIBLE FOR YOUR CROHN'S DISEASE.  IT IS MUCH HARDER TO CONTROL YOUR CROHN'S WHILE YOU SMOKE! Return to see Dr. Ardis Hughs in 6 months, sooner if needed. Continue remicade every 8 weeks for now.

## 2012-03-25 ENCOUNTER — Other Ambulatory Visit (HOSPITAL_COMMUNITY): Payer: Self-pay | Admitting: Nephrology

## 2012-04-09 ENCOUNTER — Encounter (HOSPITAL_COMMUNITY): Payer: Self-pay

## 2012-04-09 ENCOUNTER — Encounter (HOSPITAL_COMMUNITY)
Admission: RE | Admit: 2012-04-09 | Discharge: 2012-04-09 | Disposition: A | Discharge status: Home / Self Care | Payer: Medicare Other | Source: Ambulatory Visit | Attending: Gastroenterology | Admitting: Gastroenterology

## 2012-04-09 VITALS — BP 130/75 | HR 91 | Temp 98.0°F | Resp 18 | Ht 76.0 in | Wt 170.0 lb

## 2012-04-09 DIAGNOSIS — D649 Anemia, unspecified: Secondary | ICD-10-CM | POA: Insufficient documentation

## 2012-04-09 DIAGNOSIS — N183 Chronic kidney disease, stage 3 unspecified: Secondary | ICD-10-CM | POA: Insufficient documentation

## 2012-04-09 LAB — IRON AND TIBC
Iron: 70 ug/dL (ref 42–135)
UIBC: 156 ug/dL (ref 125–400)

## 2012-04-09 LAB — HEMOGLOBIN: Hemoglobin: 10.5 g/dL — ABNORMAL LOW (ref 13.0–17.0)

## 2012-04-09 MED ORDER — EPOETIN ALFA 10000 UNIT/ML IJ SOLN: 10000.0000 [IU] | INTRAMUSCULAR | Status: DC

## 2012-04-09 MED ORDER — EPOETIN ALFA 10000 UNIT/ML IJ SOLN
10000.0000 [IU] | INTRAMUSCULAR | Status: DC
  Administered 2012-04-09: 10000 [IU] via SUBCUTANEOUS
  Filled 2012-04-09: qty 1

## 2012-04-10 LAB — FERRITIN: Ferritin: 814 ng/mL — ABNORMAL HIGH (ref 22–322)

## 2012-04-27 ENCOUNTER — Encounter (HOSPITAL_COMMUNITY)
Admission: RE | Admit: 2012-04-27 | Discharge: 2012-04-27 | Disposition: A | Discharge status: Home / Self Care | Payer: Medicare Other | Source: Ambulatory Visit | Attending: Gastroenterology | Admitting: Gastroenterology

## 2012-04-27 VITALS — BP 131/80 | HR 76 | Temp 97.0°F | Resp 16 | Ht 76.0 in | Wt 167.2 lb

## 2012-04-27 DIAGNOSIS — K5010 Crohn's disease of large intestine without complications: Secondary | ICD-10-CM | POA: Insufficient documentation

## 2012-04-27 MED ORDER — ACETAMINOPHEN 325 MG PO TABS
650.0000 mg | ORAL_TABLET | ORAL | Status: AC
  Administered 2012-04-27: 650 mg via ORAL
  Filled 2012-04-27: qty 2

## 2012-04-27 MED ORDER — DIPHENHYDRAMINE HCL 25 MG PO CAPS
25.0000 mg | ORAL_CAPSULE | ORAL | Status: AC
  Administered 2012-04-27: 25 mg via ORAL
  Filled 2012-04-27: qty 1

## 2012-04-27 MED ORDER — SODIUM CHLORIDE 0.9 % IV SOLN
5.0000 mg/kg | INTRAVENOUS | Status: AC
  Administered 2012-04-27: 400 mg via INTRAVENOUS
  Filled 2012-04-27: qty 40

## 2012-04-27 MED ORDER — SODIUM CHLORIDE 0.9 % IV SOLN
INTRAVENOUS | Status: DC
  Administered 2012-04-27: 14:00:00 via INTRAVENOUS

## 2012-05-07 ENCOUNTER — Encounter (HOSPITAL_COMMUNITY)
Admission: RE | Admit: 2012-05-07 | Discharge: 2012-05-07 | Disposition: A | Discharge status: Home / Self Care | Payer: Medicare Other | Source: Ambulatory Visit | Attending: Nephrology | Admitting: Nephrology

## 2012-05-07 ENCOUNTER — Encounter (HOSPITAL_COMMUNITY): Payer: Self-pay

## 2012-05-07 VITALS — BP 113/75 | HR 97 | Temp 97.3°F | Resp 18

## 2012-05-07 LAB — HEMOGLOBIN: Hemoglobin: 12.1 g/dL — ABNORMAL LOW (ref 13.0–17.0)

## 2012-05-09 LAB — IRON AND TIBC
Iron: 87 ug/dL (ref 42–135)
Saturation Ratios: 36 % (ref 20–55)
TIBC: 244 ug/dL (ref 215–435)

## 2012-05-09 LAB — FERRITIN: Ferritin: 830 ng/mL — ABNORMAL HIGH (ref 22–322)

## 2012-05-15 ENCOUNTER — Other Ambulatory Visit: Payer: Self-pay | Admitting: Gastroenterology

## 2012-06-02 ENCOUNTER — Other Ambulatory Visit (HOSPITAL_COMMUNITY): Payer: Self-pay | Admitting: *Deleted

## 2012-06-03 ENCOUNTER — Encounter: Payer: Self-pay | Admitting: Geriatric Medicine

## 2012-06-04 ENCOUNTER — Encounter (HOSPITAL_COMMUNITY)
Admission: RE | Admit: 2012-06-04 | Discharge: 2012-06-04 | Disposition: A | Discharge status: Home / Self Care | Payer: Medicare Other | Source: Ambulatory Visit | Attending: Gastroenterology | Admitting: Gastroenterology

## 2012-06-04 ENCOUNTER — Ambulatory Visit (INDEPENDENT_AMBULATORY_CARE_PROVIDER_SITE_OTHER): Payer: Medicare Other | Admitting: Internal Medicine

## 2012-06-04 ENCOUNTER — Encounter (HOSPITAL_COMMUNITY): Payer: Self-pay

## 2012-06-04 ENCOUNTER — Encounter: Payer: Self-pay | Admitting: Internal Medicine

## 2012-06-04 VITALS — BP 134/82 | HR 84 | Temp 98.1°F | Resp 16 | Ht 76.0 in | Wt 172.8 lb

## 2012-06-04 VITALS — BP 125/74 | HR 101 | Temp 97.0°F | Resp 16

## 2012-06-04 DIAGNOSIS — D649 Anemia, unspecified: Secondary | ICD-10-CM | POA: Insufficient documentation

## 2012-06-04 DIAGNOSIS — K5090 Crohn's disease, unspecified, without complications: Secondary | ICD-10-CM | POA: Insufficient documentation

## 2012-06-04 DIAGNOSIS — N183 Chronic kidney disease, stage 3 unspecified: Secondary | ICD-10-CM | POA: Insufficient documentation

## 2012-06-04 DIAGNOSIS — N179 Acute kidney failure, unspecified: Secondary | ICD-10-CM | POA: Insufficient documentation

## 2012-06-04 LAB — IRON AND TIBC
Saturation Ratios: 37 % (ref 20–55)
UIBC: 121 ug/dL — ABNORMAL LOW (ref 125–400)

## 2012-06-04 LAB — HEMOGLOBIN: Hemoglobin: 10.6 g/dL — ABNORMAL LOW (ref 13.0–17.0)

## 2012-06-04 MED ORDER — EPOETIN ALFA 10000 UNIT/ML IJ SOLN
10000.0000 [IU] | INTRAMUSCULAR | Status: DC
  Administered 2012-06-04: 10000 [IU] via SUBCUTANEOUS
  Filled 2012-06-04: qty 1

## 2012-06-04 NOTE — Assessment & Plan Note (Signed)
Has been stable.  No edema, no shortness of breath.  Has been walking more and doing well.  He has no true history of congestive heart failure.  I suspect this diagnosis was improperly entered in his chart to explain his edema.  He tells me he has never had an echocardiogram done.  He's had two normal EKGs in our old EMR and has no related symptoms.

## 2012-06-04 NOTE — Progress Notes (Signed)
Patient ID: Brent [name], male   DOB: [birth year], 67 y.o.   MRN: [id number]  Allergies  Allergen Reactions  . Azathioprine     REACTION: affected WBC  . Ciprofloxacin     Pt denies any reaction  . Levaquin (Levofloxacin In D5w)     Pt denies any reaction  . Plendil (Felodipine)     Pt denies any reaction    Chief Complaint  Patient presents with  . Concerns regarding Dx of CHF--says he doesnt have it but ins    HPI: Patient is a 67 y.o. AA male seen in the office today for concerns about not having CHF and having this in his chart.  Review of Systems:  Review of Systems  HENT: Negative for congestion.   Respiratory: Negative for cough and shortness of breath.   Cardiovascular: Positive for leg swelling. Negative for chest pain, palpitations, orthopnea and PND.  Gastrointestinal: Negative for heartburn, nausea and vomiting.  Genitourinary: Negative for dysuria.  Musculoskeletal: Negative for falls.  Neurological: Negative for weakness.     Past Medical History  Diagnosis Date  . Crohn's   . Anxiety   . Depression   . Renal insufficiency   . Anemia   . Edema   . Other abnormal blood chemistry   . Malnutrition of mild degree   . Acute cholecystitis   . Abdominal pain, unspecified site   . Viremia, unspecified   . Pain in joint, lower leg   . Loss of weight   . Regional enteritis of small intestine   . Benign paroxysmal positional vertigo   . Other B-complex deficiencies   . Hypertensive renal disease, benign   . Other B-complex deficiencies   . Anemia, unspecified   . Other specified disease of white blood cells   . Chronic kidney disease, stage III (moderate)   . Unspecified disorder of skin and subcutaneous tissue   . Tobacco use disorder   . Celiac disease   . Essential and other specified forms of tremor   . Abdominal pain, right lower quadrant   . Hypertrophy of prostate without urinary obstruction and other lower urinary tract symptoms (LUTS)   . Dysuria    . Dizziness and giddiness   . Encounter for long-term (current) use of other medications   . Iron deficiency anemia, unspecified   . Essential hypertension, benign   . Other extrapyramidal disease and abnormal movement disorder   . Pain in limb   . Other extrapyramidal disease and abnormal movement disorder   . Insomnia with sleep apnea, unspecified   . Insomnia with sleep apnea, unspecified   . Other specified disease of white blood cells   . Narcolepsy without cataplexy   . Regional enteritis of small intestine with large intestine   . Hypopotassemia   . Nausea with vomiting   . Viral warts, unspecified   . Impotence of organic origin   . Cough   . Postinflammatory pulmonary fibrosis   . Cellulitis and abscess of unspecified site   . Dizziness and giddiness   . Dizziness and giddiness   . Other malaise and fatigue   . Diarrhea   . Bipolar I disorder, most recent episode (or current) unspecified   . Impacted cerumen   . Neuralgia, neuritis, and radiculitis, unspecified    Past Surgical History  Procedure Laterality Date  . Small intestine surgery      x 2  . Cholecystectomy  07-12-2010   Social History:   reports that  he has been smoking Cigarettes.  He has a 49 pack-year smoking history. He has never used smokeless tobacco. He reports that he does not drink alcohol or use illicit drugs.  Family History  Problem Relation Age of Onset  . Diabetes Mother     maternal grandmother  . Pneumonia Maternal Grandmother   . Emphysema Father   . Colon cancer Neg Hx     Medications: Patient's Medications  New Prescriptions   No medications on file  Previous Medications   ACETAMINOPHEN 650 MG TABS    Take 650 mg by mouth once. Prior to infusion   AMLODIPINE (NORVASC) 10 MG TABLET    Take 10 mg by mouth Nightly.     AMMONIUM LACTATE (AMLACTIN) 12 % LOTION    Apply 1 application topically as needed.     CALCIUM CARBONATE-VIT D-MIN 600-400 MG-UNIT TABS    Take 1 tablet by mouth  2 (two) times daily.     CHOLESTYRAMINE (QUESTRAN) 4 GM/DOSE POWDER    TAKE 1 SCOOPFUL DISSOLVED IN WATER ONCE DAILY.   DIPHENHYDRAMINE (BENADRYL) 25 MG CAPSULE    Take 1-2 capsules (25-50 mg total) by mouth once. Prior to infusion   DIVALPROEX (DEPAKOTE) 500 MG EC TABLET    Take 1,500 mg by mouth daily.    EPOETIN ALFA (PROCRIT) 75643 UNIT/ML INJECTION    Inject 10,000 Units into the skin every 14 (fourteen) days.    FERROUS SULFATE 325 (65 FE) MG TABLET    Take 325 mg by mouth 2 (two) times daily.     INFLIXIMAB (REMICADE) 100 MG INJECTION    Infuse Remicade IV schedule 1 17m/kg every 8 weeks Premedicate with Tylenol 500-6589mby mouth and Benadryl 25-5028my mouth prior to infusion. Last PPD was on 12/2009.    LOPERAMIDE HCL (LOPERAMIDE A-D PO)    Take 1 capsule by mouth as needed.     LUTEIN 20 MG TABS    Take 1 tablet by mouth daily.     MAGNESIUM OXIDE (MAG-OX) 400 MG TABLET    Take 400 mg by mouth daily.     METOPROLOL SUCCINATE (TOPROL-XL) 25 MG 24 HR TABLET    Take 25 mg by mouth daily.     MULTIPLE VITAMIN (MULTIVITAMIN) TABLET    Take 1 tablet by mouth daily.     OLANZAPINE (ZYPREXA) 10 MG TABLET    Take 10 mg by mouth at bedtime.     OMEPRAZOLE (PRILOSEC) 20 MG CAPSULE    Take 20 mg by mouth daily.     OXYBUTYNIN (DITROPAN XL) 15 MG 24 HR TABLET    Take 15 mg by mouth daily.     TAMSULOSIN HCL (FLOMAX) 0.4 MG CAPS    Take 0.4 mg by mouth daily.   VITAMIN E 400 UNIT CAPSULE    Take 400 Units by mouth daily.    Modified Medications   No medications on file  Discontinued Medications   No medications on file     Physical Exam:  Filed Vitals:   06/04/12 0919  BP: 134/82  Pulse: 84  Temp: 98.1 F (36.7 C)  TempSrc: Oral  Resp: 16  Height: 6' 4"  (1.93 m)  Weight: 172 lb 12.8 oz (78.382 kg)   Physical Exam  Constitutional: He is oriented to person, place, and time. He appears well-developed and well-nourished.  Tall AA male  HENT:  Head: Normocephalic and atraumatic.   Cardiovascular: Normal rate, regular rhythm, normal heart sounds and intact distal pulses.  No edema present today  Pulmonary/Chest: Effort normal and breath sounds normal. No respiratory distress. He has no wheezes. He has no rales.  Abdominal: Soft. Bowel sounds are normal. He exhibits no distension. There is no tenderness.  Musculoskeletal: Normal range of motion.  Neurological: He is alert and oriented to person, place, and time.  Skin: Skin is warm and dry.  Psychiatric: He has a normal mood and affect.   Labs reviewed: Basic Metabolic Panel:  Recent Labs  08/05/11 1554 11/19/11 0952  NA 137 143  K 4.2 3.9  CL 104 110*  CO2 25 23  GLUCOSE 82 80  BUN 20 15.0  CREATININE 1.77* 1.8*  CALCIUM 9.1 9.2   Liver Function Tests:  Recent Labs  08/05/11 1554 11/19/11 0952  AST 16 22  ALT 16 24  ALKPHOS 81 94  BILITOT 0.3 0.40  PROT 6.8 6.9  ALBUMIN 3.4* 3.4*  CBC:  Recent Labs  08/05/11 1554  11/19/11 0952  02/13/12 1501  04/09/12 1335 05/07/12 1500 06/04/12 1514  WBC 8.4  --  8.2  --   --   --   --   --   --   NEUTROABS 5.2  --  4.5  --   --   --   --   --   --   HGB 11.0*  < > 11.4*  < > 10.6*  < > 10.5* 12.1* 10.6*  HCT 33.4*  --  35.6*  --  32.7*  --   --   --   --   MCV 102.1*  --  104.5*  --   --   --   --   --   --   PLT 144  --  157  --   --   --   --   --   --   < > = values in this interval not displayed.  Assessment/Plan Chronic kidney disease (CKD), stage III (moderate) Has been stable.  No edema, no shortness of breath.  Has been walking more and doing well.  He has no true history of congestive heart failure.  I suspect this diagnosis was improperly entered in his chart to explain his edema.  He tells me he has never had an echocardiogram done.  He's had two normal EKGs in our old EMR and has no related symptoms.     Labs/tests ordered:  Keep appt with labs before

## 2012-06-18 ENCOUNTER — Encounter (HOSPITAL_COMMUNITY): Payer: Medicare Other

## 2012-06-21 ENCOUNTER — Other Ambulatory Visit: Payer: Self-pay

## 2012-06-21 DIAGNOSIS — K5090 Crohn's disease, unspecified, without complications: Secondary | ICD-10-CM

## 2012-06-22 ENCOUNTER — Encounter (HOSPITAL_COMMUNITY)
Admission: RE | Admit: 2012-06-22 | Discharge: 2012-06-22 | Disposition: A | Discharge status: Home / Self Care | Payer: Medicare Other | Source: Ambulatory Visit | Attending: Gastroenterology | Admitting: Gastroenterology

## 2012-06-22 ENCOUNTER — Other Ambulatory Visit (HOSPITAL_COMMUNITY): Payer: Self-pay | Admitting: Nephrology

## 2012-06-22 ENCOUNTER — Encounter (HOSPITAL_COMMUNITY): Payer: Self-pay

## 2012-06-22 VITALS — BP 124/73 | HR 69 | Temp 97.3°F | Resp 20 | Ht 76.0 in | Wt 170.1 lb

## 2012-06-22 DIAGNOSIS — K5090 Crohn's disease, unspecified, without complications: Secondary | ICD-10-CM

## 2012-06-22 MED ORDER — SODIUM CHLORIDE 0.9 % IV SOLN
5.0000 mg/kg | INTRAVENOUS | Status: DC
  Administered 2012-06-22: 400 mg via INTRAVENOUS
  Filled 2012-06-22: qty 40

## 2012-06-22 MED ORDER — INFLIXIMAB 100 MG IV SOLR
5.0000 mg/kg | INTRAVENOUS | Status: DC
  Filled 2012-06-22: qty 40

## 2012-06-22 MED ORDER — DIPHENHYDRAMINE HCL 25 MG PO CAPS
25.0000 mg | ORAL_CAPSULE | ORAL | Status: DC
Start: 2012-06-22 — End: 2012-06-23
  Administered 2012-06-22: 25 mg via ORAL
  Filled 2012-06-22: qty 1

## 2012-06-22 MED ORDER — ACETAMINOPHEN 325 MG PO TABS
650.0000 mg | ORAL_TABLET | ORAL | Status: DC
  Administered 2012-06-22: 650 mg via ORAL
  Filled 2012-06-22: qty 2

## 2012-06-22 MED ORDER — ACETAMINOPHEN ER 650 MG PO TBCR
650.0000 mg | EXTENDED_RELEASE_TABLET | ORAL | Status: DC
  Filled 2012-06-22: qty 1

## 2012-06-22 MED ORDER — SODIUM CHLORIDE 0.9 % IV SOLN
INTRAVENOUS | Status: DC
  Administered 2012-06-22: 20 mL/h via INTRAVENOUS

## 2012-06-25 ENCOUNTER — Encounter: Payer: Self-pay | Admitting: Geriatric Medicine

## 2012-06-28 ENCOUNTER — Encounter: Payer: Self-pay | Admitting: Internal Medicine

## 2012-06-28 ENCOUNTER — Ambulatory Visit (INDEPENDENT_AMBULATORY_CARE_PROVIDER_SITE_OTHER): Payer: Medicare Other | Admitting: Internal Medicine

## 2012-06-28 VITALS — BP 110/68 | HR 88 | Temp 98.8°F | Resp 18 | Ht 76.0 in | Wt 164.2 lb

## 2012-06-28 DIAGNOSIS — N183 Chronic kidney disease, stage 3 unspecified: Secondary | ICD-10-CM

## 2012-06-28 DIAGNOSIS — I10 Essential (primary) hypertension: Secondary | ICD-10-CM

## 2012-06-28 DIAGNOSIS — D649 Anemia, unspecified: Secondary | ICD-10-CM

## 2012-06-28 DIAGNOSIS — Z13220 Encounter for screening for lipoid disorders: Secondary | ICD-10-CM

## 2012-06-28 DIAGNOSIS — K5090 Crohn's disease, unspecified, without complications: Secondary | ICD-10-CM

## 2012-06-28 DIAGNOSIS — Z79899 Other long term (current) drug therapy: Secondary | ICD-10-CM

## 2012-06-28 NOTE — Assessment & Plan Note (Signed)
Has been stable.  F/u renal function today.  Gets procrit injections as needed through Dr. Lamonte Sakai.

## 2012-06-28 NOTE — Assessment & Plan Note (Signed)
Stable with remicade.

## 2012-06-28 NOTE — Progress Notes (Signed)
Patient ID: Brent [name], male   DOB: 12/23/1945, 67 y.o.   MRN: [id number] Code Status: must be addressed at upcoming visit  Allergies  Allergen Reactions  . Azathioprine     REACTION: affected WBC  . Ciprofloxacin     Pt denies any reaction  . Levaquin (Levofloxacin In D5w)     Pt denies any reaction  . Plendil (Felodipine)     Pt denies any reaction    Chief Complaint  Patient presents with  . Medical Managment of Chronic Issues    no concerns     HPI: Patient is a 67 y.o. AA male seen in the office today for medical management of chronic diseases.  Is doing well.  Has no new problems in past few months.    Gets remicade q 4 wks and procrit as needed every 2 weeks.  Crohn's is under control.    Bladder doing fine.  Using ditropan and tamsulosin.  Used to have urgency in the past, now just frequency.    Blood pressure very good today.    Has bipolar with depakote monitored at [place].    Has lost 6 lbs since last routine visit here.  Eats well.    Review of Systems:  Review of Systems  Constitutional: Negative for fever and malaise/fatigue.  HENT: Negative for congestion and neck pain.   Eyes: Negative for blurred vision.  Respiratory: Positive for sputum production. Negative for cough and shortness of breath.   Cardiovascular: Negative for chest pain.  Gastrointestinal: Negative for heartburn, nausea, vomiting, abdominal pain, diarrhea, constipation, blood in stool and melena.  Genitourinary: Positive for frequency. Negative for dysuria.  Musculoskeletal: Negative for myalgias, back pain, joint pain and falls.  Skin: Negative for rash.  Neurological: Negative for dizziness, tingling and headaches.  Endo/Heme/Allergies: Bruises/bleeds easily.  Psychiatric/Behavioral: Positive for depression. Negative for memory loss. The patient does not have insomnia.      Past Medical History  Diagnosis Date  . Crohn's   . Anxiety   . Depression   . Renal insufficiency   . Anemia    . Edema   . Other abnormal blood chemistry   . Malnutrition of mild degree   . Acute cholecystitis   . Abdominal pain, unspecified site   . Viremia, unspecified   . Pain in joint, lower leg   . Loss of weight   . Regional enteritis of small intestine   . Benign paroxysmal positional vertigo   . Other B-complex deficiencies   . Hypertensive renal disease, benign   . Other B-complex deficiencies   . Anemia, unspecified   . Other specified disease of white blood cells   . Chronic kidney disease, stage III (moderate)   . Unspecified disorder of skin and subcutaneous tissue   . Tobacco use disorder   . Celiac disease   . Essential and other specified forms of tremor   . Abdominal pain, right lower quadrant   . Hypertrophy of prostate without urinary obstruction and other lower urinary tract symptoms (LUTS)   . Dysuria   . Dizziness and giddiness   . Encounter for long-term (current) use of other medications   . Iron deficiency anemia, unspecified   . Essential hypertension, benign   . Other extrapyramidal disease and abnormal movement disorder   . Pain in limb   . Other extrapyramidal disease and abnormal movement disorder   . Insomnia with sleep apnea, unspecified   . Insomnia with sleep apnea, unspecified   .  Other specified disease of white blood cells   . Narcolepsy without cataplexy   . Regional enteritis of small intestine with large intestine   . Hypopotassemia   . Nausea with vomiting   . Viral warts, unspecified   . Impotence of organic origin   . Cough   . Postinflammatory pulmonary fibrosis   . Cellulitis and abscess of unspecified site   . Dizziness and giddiness   . Dizziness and giddiness   . Other malaise and fatigue   . Diarrhea   . Bipolar I disorder, most recent episode (or current) unspecified   . Impacted cerumen   . Neuralgia, neuritis, and radiculitis, unspecified    Past Surgical History  Procedure Laterality Date  . Small intestine surgery       x 2  . Cholecystectomy  07-12-2010   Social History:   reports that he quit smoking about 2 months ago. His smoking use included Cigarettes. He started smoking about 56 years ago. He has a 49 pack-year smoking history. He has never used smokeless tobacco. He reports that he does not drink alcohol or use illicit drugs.  Family History  Problem Relation Age of Onset  . Diabetes Mother     maternal grandmother  . Pneumonia Maternal Grandmother   . Emphysema Father   . Colon cancer Neg Hx     Medications: Patient's Medications  New Prescriptions   No medications on file  Previous Medications   AMLODIPINE (NORVASC) 10 MG TABLET    Take 10 mg by mouth Nightly.     CALCIUM CARBONATE-VIT D-MIN 600-400 MG-UNIT TABS    Take 1 tablet by mouth 2 (two) times daily.     CHOLESTYRAMINE (QUESTRAN) 4 GM/DOSE POWDER    TAKE 1 SCOOPFUL DISSOLVED IN WATER ONCE DAILY.   DIPHENHYDRAMINE (BENADRYL) 25 MG CAPSULE    Take 1-2 capsules (25-50 mg total) by mouth once. Prior to infusion   DIVALPROEX (DEPAKOTE) 500 MG EC TABLET    Take 1,500 mg by mouth daily.    EPOETIN ALFA (PROCRIT) 54098 UNIT/ML INJECTION    Inject 10,000 Units into the skin every 14 (fourteen) days.    FERROUS SULFATE 325 (65 FE) MG TABLET    Take 325 mg by mouth 2 (two) times daily.     INFLIXIMAB (REMICADE) 100 MG INJECTION    Infuse Remicade IV schedule 1 13m/kg every 8 weeks Premedicate with Tylenol 500-6555mby mouth and Benadryl 25-5045my mouth prior to infusion. Last PPD was on 12/2009.    LOPERAMIDE HCL (LOPERAMIDE A-D PO)    Take 1 capsule by mouth as needed.     LUTEIN 20 MG TABS    Take 1 tablet by mouth daily.     MAGNESIUM OXIDE (MAG-OX) 400 MG TABLET    Take 400 mg by mouth daily.     METOPROLOL SUCCINATE (TOPROL-XL) 25 MG 24 HR TABLET    Take 25 mg by mouth daily.     MULTIPLE VITAMIN (MULTIVITAMIN) TABLET    Take 1 tablet by mouth daily.     OLANZAPINE (ZYPREXA) 10 MG TABLET    Take 10 mg by mouth at bedtime.      OMEPRAZOLE (PRILOSEC) 20 MG CAPSULE    Take 20 mg by mouth daily.     OXYBUTYNIN (DITROPAN XL) 15 MG 24 HR TABLET    Take 15 mg by mouth daily.     TAMSULOSIN HCL (FLOMAX) 0.4 MG CAPS    Take 0.4 mg by mouth daily.  VITAMIN E 400 UNIT CAPSULE    Take 400 Units by mouth daily.    Modified Medications   No medications on file  Discontinued Medications   ACETAMINOPHEN 650 MG TABS    Take 650 mg by mouth once. Prior to infusion   AMMONIUM LACTATE (AMLACTIN) 12 % LOTION    Apply 1 application topically as needed.       Physical Exam:  Filed Vitals:   06/28/12 1249  BP: 110/68  Pulse: 88  Temp: 98.8 F (37.1 C)  Resp: 18  Height: 6' 4"  (1.93 m)  Weight: 164 lb 3.2 oz (74.481 kg)  Physical Exam  Constitutional: He is oriented to person, place, and time. No distress.  Tall, thin AA male  HENT:  Head: Normocephalic and atraumatic.  Eyes:  Wears glasses  Cardiovascular: Normal rate, regular rhythm, normal heart sounds and intact distal pulses.   Pulmonary/Chest: Effort normal and breath sounds normal. No respiratory distress.  Abdominal: Soft. Bowel sounds are normal. He exhibits no distension. There is no tenderness.  Musculoskeletal: Normal range of motion. He exhibits no edema and no tenderness.  Neurological: He is alert and oriented to person, place, and time.  Skin: Skin is warm and dry.  Psychiatric:  anxious    Labs reviewed: 07/23/2010 CBC: WBC 11.1, RBC 2.80, Hemoglobin 9.4 CMP: Glucose 88, BUN 12, Creatinine 1.96, Albumin 3.3 07/26/2010  U/A: negative 08/07/2010 CBC: wBC 8.0, RBC 2.83, Hemoglobin 9.3 09/12/2010 [facility] labs Iron 107, TIBC 255, Ferritin 23 Hgb 12.1 03/24/2011  BMP: glucose 80, BUN 33, Creatinine 2.10 CBC: wbc 6.8, rbc 3.52, Hemoglobin 11.9 A1C: 5.1 12/26/2011  CBC: wbc 9.0, rbc 3.53, Hemoglobin 11.2 CMP: glucose 82, BUN 17, Creatinine 1.66 A1C: 5.2  Basic Metabolic Panel:  Recent Labs  08/05/11 1554 11/19/11 0952  NA 137 143  K 4.2 3.9  CL 104  110*  CO2 25 23  GLUCOSE 82 80  BUN 20 15.0  CREATININE 1.77* 1.8*  CALCIUM 9.1 9.2   Liver Function Tests:  Recent Labs  08/05/11 1554 11/19/11 0952  AST 16 22  ALT 16 24  ALKPHOS 81 94  BILITOT 0.3 0.40  PROT 6.8 6.9  ALBUMIN 3.4* 3.4*  CBC:  Recent Labs  08/05/11 1554  11/19/11 0952  02/13/12 1501  04/09/12 1335 05/07/12 1500 06/04/12 1514  WBC 8.4  --  8.2  --   --   --   --   --   --   NEUTROABS 5.2  --  4.5  --   --   --   --   --   --   HGB 11.0*  < > 11.4*  < > 10.6*  < > 10.5* 12.1* 10.6*  HCT 33.4*  --  35.6*  --  32.7*  --   --   --   --   MCV 102.1*  --  104.5*  --   --   --   --   --   --   PLT 144  --  157  --   --   --   --   --   --   < > = values in this interval not displayed.  Past Procedures: 02/15/2010 MRI of the Head 1. No acute or focal abnormality to explain the patient symptoms 2. Periventricular and anterior subcortical white matter changes are slightly greater than expected for age. The findings is nonspecific  but can be seen in the setting of chronic microvascular ischemia a  demyelinating process as multiple sclerosis vasculitis complicated migrane headaches or as the sequelae of a prior infectious or inflammatory process  3. Focal  susceptibility at the forearm of [place]. This could represent some calcification within a small colloid cyst. remote hemorrhage or calcification along the septum pellucidum could give a similar appearance CT may be useful for futher evaluation  01/2010:  colonoscopy with Crohn's enteritis 03/14/2010 Chest x-ray no active cardiopulmonary disease or interval change from prior. Emphysema. 07/10/2010-CT Abdomen and Pelvis: showed previous bowel resection related to Crohn's enteritis. Thickening of the distal small bowel, proximal colon to the hepatic flexure would suggest an active Crohn's enteritis presently. No abscess or free air. no obstruction.  07/11/2010-HIDA Scan: showed non filling gallbladder consistent with cystic  duct obstruction and acute cholecystitis.  07/26/2010-Chest X-Ray: Since 03/14/10, minimal right basilar linear atelectasis. No active cardiopulmonary disease (less demonstrable hyperinflation since 07/21/04)  Assessment/Plan 1. Screening, lipid - Lipid panel  2. Essential hypertension, benign -at goal with current therapy - Comprehensive metabolic panel  3. Encounter for long-term (current) use of medications -taking magnesium so will f/u level - Magnesium  4. CROHN'S DISEASE --appears he is due to see Dr. Ardis Hughs for f/u visit and cscope --pt also goes to the [place] --I am concerned that he is losing weight, but he's not had increased difficulty with his Crohn's and has a good appetite, is not trying to lose weight  5. Chronic kidney disease (CKD), stage III (moderate) --f/u cmp --progression of renal failure could also cause weight loss  6. Anemia --due to chronic disease--Crohn's, CKDIII  had flu shot in august, pneumonia shot 2006, tdap at [place] in april  Labs/tests ordered:  Cbc, flp, mg Due to see Dr. Ardis Hughs for f/u visit (GI)

## 2012-06-28 NOTE — Assessment & Plan Note (Signed)
Iron deficiency and chronic disease.  Continues procrit prn and ferrous sulfate.  Follows by hematology.

## 2012-06-29 LAB — COMPREHENSIVE METABOLIC PANEL
ALT: 11 IU/L (ref 0–44)
AST: 14 IU/L (ref 0–40)
Albumin/Globulin Ratio: 1.6 (ref 1.1–2.5)
Albumin: 4.2 g/dL (ref 3.6–4.8)
Alkaline Phosphatase: 74 IU/L (ref 39–117)
BUN/Creatinine Ratio: 13 (ref 10–22)
BUN: 27 mg/dL (ref 8–27)
CO2: 22 mmol/L (ref 19–28)
Calcium: 9.4 mg/dL (ref 8.6–10.2)
Chloride: 105 mmol/L (ref 97–108)
Creatinine, Ser: 2.05 mg/dL — ABNORMAL HIGH (ref 0.76–1.27)
GFR calc Af Amer: 38 mL/min/{1.73_m2} — ABNORMAL LOW (ref >59)
GFR calc non Af Amer: 33 mL/min/{1.73_m2} — ABNORMAL LOW (ref >59)
Globulin, Total: 2.6 g/dL (ref 1.5–4.5)
Glucose: 87 mg/dL (ref 65–99)
Potassium: 5.4 mmol/L — ABNORMAL HIGH (ref 3.5–5.2)
Sodium: 140 mmol/L (ref 134–144)
Total Bilirubin: 0.3 mg/dL (ref 0.0–1.2)
Total Protein: 6.8 g/dL (ref 6.0–8.5)

## 2012-06-29 LAB — LIPID PANEL
Chol/HDL Ratio: 3 ratio units (ref 0.0–5.0)
Cholesterol, Total: 124 mg/dL (ref 100–199)
HDL: 41 mg/dL (ref >39)
LDL Calculated: 59 mg/dL (ref 0–99)
Triglycerides: 120 mg/dL (ref 0–149)
VLDL Cholesterol Cal: 24 mg/dL (ref 5–40)

## 2012-06-29 LAB — MAGNESIUM: Magnesium: 1.7 mg/dL (ref 1.6–2.6)

## 2012-07-02 ENCOUNTER — Encounter (HOSPITAL_COMMUNITY): Payer: Self-pay

## 2012-07-02 ENCOUNTER — Encounter (HOSPITAL_COMMUNITY)
Admission: RE | Admit: 2012-07-02 | Discharge: 2012-07-02 | Disposition: A | Discharge status: Home / Self Care | Payer: Medicare Other | Source: Ambulatory Visit | Attending: Gastroenterology | Admitting: Gastroenterology

## 2012-07-02 VITALS — BP 115/70 | HR 92 | Temp 98.3°F | Resp 18

## 2012-07-02 DIAGNOSIS — D638 Anemia in other chronic diseases classified elsewhere: Secondary | ICD-10-CM | POA: Insufficient documentation

## 2012-07-02 DIAGNOSIS — N183 Chronic kidney disease, stage 3 unspecified: Secondary | ICD-10-CM | POA: Insufficient documentation

## 2012-07-02 DIAGNOSIS — D649 Anemia, unspecified: Secondary | ICD-10-CM

## 2012-07-02 LAB — IRON AND TIBC: UIBC: 119 ug/dL — ABNORMAL LOW (ref 125–400)

## 2012-07-02 MED ORDER — EPOETIN ALFA 20000 UNIT/ML IJ SOLN
20000.0000 [IU] | INTRAMUSCULAR | Status: DC
  Administered 2012-07-02: 20000 [IU] via SUBCUTANEOUS
  Filled 2012-07-02: qty 1

## 2012-07-16 ENCOUNTER — Encounter (HOSPITAL_COMMUNITY): Payer: Medicare Other

## 2012-07-22 ENCOUNTER — Encounter: Payer: Self-pay | Admitting: Gastroenterology

## 2012-07-30 ENCOUNTER — Encounter (HOSPITAL_COMMUNITY): Payer: Medicare Other

## 2012-07-30 ENCOUNTER — Inpatient Hospital Stay (HOSPITAL_COMMUNITY): Admission: RE | Admit: 2012-07-30 | Payer: Medicare Other | Source: Ambulatory Visit

## 2012-08-06 ENCOUNTER — Encounter (HOSPITAL_COMMUNITY)
Admission: RE | Admit: 2012-08-06 | Discharge: 2012-08-06 | Disposition: A | Discharge status: Home / Self Care | Payer: Medicare Other | Source: Ambulatory Visit | Attending: Gastroenterology | Admitting: Gastroenterology

## 2012-08-06 ENCOUNTER — Encounter (HOSPITAL_COMMUNITY): Payer: Self-pay

## 2012-08-06 VITALS — BP 104/70 | HR 97 | Temp 98.7°F | Resp 18

## 2012-08-06 DIAGNOSIS — D649 Anemia, unspecified: Secondary | ICD-10-CM | POA: Insufficient documentation

## 2012-08-06 LAB — IRON AND TIBC
Iron: 70 ug/dL (ref 42–135)
Saturation Ratios: 32 % (ref 20–55)
TIBC: 216 ug/dL (ref 215–435)

## 2012-08-06 LAB — HEMOGLOBIN: Hemoglobin: 11.6 g/dL — ABNORMAL LOW (ref 13.0–17.0)

## 2012-08-06 MED ORDER — EPOETIN ALFA 10000 UNIT/ML IJ SOLN: 20000.0000 [IU] | INTRAMUSCULAR | Status: DC

## 2012-08-06 NOTE — Progress Notes (Signed)
HGB  11.6, no medication given, lab result faxed to Dr. Posey Pronto.

## 2012-08-17 ENCOUNTER — Encounter (HOSPITAL_COMMUNITY): Admission: RE | Admit: 2012-08-17 | Payer: Medicare Other | Source: Ambulatory Visit

## 2012-08-20 ENCOUNTER — Encounter (HOSPITAL_COMMUNITY): Payer: Medicare Other

## 2012-09-03 ENCOUNTER — Encounter (HOSPITAL_COMMUNITY): Admission: RE | Admit: 2012-09-03 | Payer: Medicare HMO | Source: Ambulatory Visit

## 2012-09-20 ENCOUNTER — Ambulatory Visit: Payer: Medicare Other | Admitting: Gastroenterology

## 2012-10-01 ENCOUNTER — Encounter (HOSPITAL_COMMUNITY): Payer: Medicare HMO

## 2012-10-08 ENCOUNTER — Encounter (HOSPITAL_COMMUNITY)
Admission: RE | Admit: 2012-10-08 | Discharge: 2012-10-08 | Disposition: A | Discharge status: Home / Self Care | Payer: Medicare Other | Source: Ambulatory Visit | Attending: Gastroenterology | Admitting: Gastroenterology

## 2012-10-08 ENCOUNTER — Encounter (HOSPITAL_COMMUNITY): Payer: Self-pay

## 2012-10-08 VITALS — BP 105/61 | HR 79 | Temp 98.7°F | Resp 18

## 2012-10-08 DIAGNOSIS — N183 Chronic kidney disease, stage 3 unspecified: Secondary | ICD-10-CM | POA: Insufficient documentation

## 2012-10-08 DIAGNOSIS — D649 Anemia, unspecified: Secondary | ICD-10-CM

## 2012-10-08 DIAGNOSIS — D638 Anemia in other chronic diseases classified elsewhere: Secondary | ICD-10-CM | POA: Insufficient documentation

## 2012-10-08 LAB — IRON AND TIBC: Iron: 76 ug/dL (ref 42–135)

## 2012-10-08 MED ORDER — EPOETIN ALFA 10000 UNIT/ML IJ SOLN
20000.0000 [IU] | INTRAMUSCULAR | Status: DC
  Administered 2012-10-08: 20000 [IU] via SUBCUTANEOUS
  Filled 2012-10-08: qty 2

## 2012-10-12 ENCOUNTER — Encounter (HOSPITAL_COMMUNITY): Admission: RE | Admit: 2012-10-12 | Payer: Medicare HMO | Source: Ambulatory Visit

## 2012-11-05 ENCOUNTER — Ambulatory Visit (HOSPITAL_COMMUNITY)
Admission: RE | Admit: 2012-11-05 | Discharge: 2012-11-05 | Disposition: A | Discharge status: Home / Self Care | Payer: Medicare Other | Source: Ambulatory Visit | Attending: Nephrology | Admitting: Nephrology

## 2012-11-05 ENCOUNTER — Encounter (HOSPITAL_COMMUNITY): Payer: Self-pay

## 2012-11-05 VITALS — BP 106/62 | HR 82 | Temp 98.8°F | Resp 18 | Ht 76.0 in | Wt 170.0 lb

## 2012-11-05 DIAGNOSIS — N189 Chronic kidney disease, unspecified: Secondary | ICD-10-CM | POA: Insufficient documentation

## 2012-11-05 DIAGNOSIS — D649 Anemia, unspecified: Secondary | ICD-10-CM | POA: Insufficient documentation

## 2012-11-05 LAB — FERRITIN: Ferritin: 899 ng/mL — ABNORMAL HIGH (ref 22–322)

## 2012-11-05 LAB — IRON AND TIBC
Saturation Ratios: 38 % (ref 20–55)
TIBC: 205 ug/dL — ABNORMAL LOW (ref 215–435)

## 2012-11-05 LAB — HEMOGLOBIN: Hemoglobin: 10.9 g/dL — ABNORMAL LOW (ref 13.0–17.0)

## 2012-11-05 MED ORDER — EPOETIN ALFA 20000 UNIT/ML IJ SOLN
20000.0000 [IU] | Freq: Once | INTRAMUSCULAR | Status: DC
  Filled 2012-11-05: qty 1

## 2012-11-05 MED ORDER — EPOETIN ALFA 10000 UNIT/ML IJ SOLN: 20000.0000 [IU] | INTRAMUSCULAR | Status: DC

## 2012-11-05 MED ORDER — EPOETIN ALFA 20000 UNIT/ML IJ SOLN
20000.0000 [IU] | INTRAMUSCULAR | Status: DC
  Administered 2012-11-05: 20000 [IU] via SUBCUTANEOUS
  Filled 2012-11-05: qty 2

## 2012-12-03 ENCOUNTER — Ambulatory Visit (INDEPENDENT_AMBULATORY_CARE_PROVIDER_SITE_OTHER): Payer: Medicare Other | Admitting: Internal Medicine

## 2012-12-03 ENCOUNTER — Ambulatory Visit (HOSPITAL_COMMUNITY)
Admission: RE | Admit: 2012-12-03 | Discharge: 2012-12-03 | Disposition: A | Discharge status: Home / Self Care | Payer: Medicare Other | Source: Ambulatory Visit | Attending: Nephrology | Admitting: Nephrology

## 2012-12-03 ENCOUNTER — Other Ambulatory Visit (HOSPITAL_COMMUNITY): Payer: Self-pay | Admitting: Nephrology

## 2012-12-03 ENCOUNTER — Encounter: Payer: Self-pay | Admitting: Internal Medicine

## 2012-12-03 ENCOUNTER — Encounter (HOSPITAL_COMMUNITY): Payer: Self-pay

## 2012-12-03 VITALS — Resp 18

## 2012-12-03 VITALS — BP 130/72 | HR 82 | Temp 98.7°F | Wt 168.0 lb

## 2012-12-03 DIAGNOSIS — J069 Acute upper respiratory infection, unspecified: Secondary | ICD-10-CM

## 2012-12-03 DIAGNOSIS — K5090 Crohn's disease, unspecified, without complications: Secondary | ICD-10-CM

## 2012-12-03 DIAGNOSIS — N183 Chronic kidney disease, stage 3 unspecified: Secondary | ICD-10-CM

## 2012-12-03 DIAGNOSIS — D649 Anemia, unspecified: Secondary | ICD-10-CM | POA: Insufficient documentation

## 2012-12-03 DIAGNOSIS — N189 Chronic kidney disease, unspecified: Secondary | ICD-10-CM | POA: Insufficient documentation

## 2012-12-03 LAB — MAGNESIUM: Magnesium: 1.4 mg/dL — ABNORMAL LOW (ref 1.5–2.5)

## 2012-12-03 LAB — RENAL FUNCTION PANEL
Albumin: 3.1 g/dL — ABNORMAL LOW (ref 3.5–5.2)
Chloride: 109 mEq/L (ref 96–112)
GFR calc Af Amer: 45 mL/min — ABNORMAL LOW (ref >90)
GFR calc non Af Amer: 39 mL/min — ABNORMAL LOW (ref >90)
Phosphorus: 2.1 mg/dL — ABNORMAL LOW (ref 2.3–4.6)
Potassium: 4 mEq/L (ref 3.5–5.1)
Sodium: 140 mEq/L (ref 135–145)

## 2012-12-03 LAB — CBC
Platelets: 135 10*3/uL — ABNORMAL LOW (ref 150–400)
RBC: 3.5 MIL/uL — ABNORMAL LOW (ref 4.22–5.81)
RDW: 13 % (ref 11.5–15.5)
WBC: 8.3 10*3/uL (ref 4.0–10.5)

## 2012-12-03 LAB — FERRITIN: Ferritin: 1256 ng/mL — ABNORMAL HIGH (ref 22–322)

## 2012-12-03 LAB — IRON AND TIBC: Iron: 85 ug/dL (ref 42–135)

## 2012-12-03 NOTE — Patient Instructions (Signed)
Please ask Dr. Posey Pronto to make sure he gets cbc and your renal function today due to your diarrhea and poor intake lately.  I should be able to see these in the computer

## 2012-12-03 NOTE — Progress Notes (Signed)
Patient ID: Brent [name], male   DOB: Mar 30, 1945, 67 y.o.   MRN: [id number] Location:  [facility]   Allergies  Allergen Reactions  . Azathioprine     REACTION: affected WBC  . Ciprofloxacin     Pt denies any reaction  . Levaquin [Levofloxacin In D5w]     Pt denies any reaction  . Plendil [Felodipine]     Pt denies any reaction    Chief Complaint  Patient presents with  . URI    Chest congestion, fever,dizziness,diarrhea deep cough since last Thursday   . Loss of Appetite    Since last Thursday patient with decreased appetite, only eats once daily   . Weakness    HPI: Patient is a 67 y.o. black male with h/o CKDIII, htn, hyperlipidemia, Crohn's disease and anemia seen in the office today for an acute visit for chest congestion, diarrhea, deep cough, dizziness, fever and loss of appetite for 8 days.  Hoarseness was so bad he couldn't talk.  He couldn't get the mucus up.  Does feel a lot better now.  No sick contacts.  No travel.  Fever subsided, appetite still not back.  Continues with congestion and cough.  Diarrhea still going also, but eating poorly and notes his crohn's chronic diarrhea is always worse if he does not eat much.  Was not like Crohn's enteritis in that imodium did not help the diarrhea.  4x per day--runny, no blood, no mucus. No abdominal pain.  Last time was this am---once.  Weak feeling, but much better than he was.    Sees Dr. Posey Pronto this afternoon for hemoglobin and renal function, possibly procrit injection, too if his hgb is <11.5.   Review of Systems:  Review of Systems  Constitutional: Positive for malaise/fatigue. Negative for fever and chills.       Fever chills now gone  HENT: Positive for congestion. Negative for sore throat.        Hoarseness  Eyes: Negative for blurred vision.  Respiratory: Positive for cough and sputum production. Negative for shortness of breath.   Cardiovascular: Negative for chest pain.   Gastrointestinal: Positive for diarrhea. Negative for nausea, vomiting, abdominal pain, constipation, blood in stool and melena.       Diminished appetite  Genitourinary: Negative for dysuria.  Musculoskeletal: Negative for falls.  Neurological: Positive for weakness. Negative for dizziness and headaches.  Psychiatric/Behavioral: Negative for depression.    Past Medical History  Diagnosis Date  . Crohn's   . Anxiety   . Depression   . Renal insufficiency   . Anemia   . Edema   . Other abnormal blood chemistry   . Malnutrition of mild degree   . Acute cholecystitis   . Abdominal pain, unspecified site   . Viremia, unspecified   . Pain in joint, lower leg   . Loss of weight   . Regional enteritis of small intestine   . Benign paroxysmal positional vertigo   . Other B-complex deficiencies   . Hypertensive renal disease, benign   . Other B-complex deficiencies   . Anemia, unspecified   . Other specified disease of white blood cells   . Chronic kidney disease, stage III (moderate)   . Unspecified disorder of skin and subcutaneous tissue   . Tobacco use disorder   . Celiac disease   . Essential and other specified forms of tremor   . Abdominal pain, right lower quadrant   . Hypertrophy of  prostate without urinary obstruction and other lower urinary tract symptoms (LUTS)   . Dysuria   . Dizziness and giddiness   . Encounter for long-term (current) use of other medications   . Iron deficiency anemia, unspecified   . Essential hypertension, benign   . Other extrapyramidal disease and abnormal movement disorder   . Pain in limb   . Other extrapyramidal disease and abnormal movement disorder   . Insomnia with sleep apnea, unspecified   . Insomnia with sleep apnea, unspecified   . Other specified disease of white blood cells   . Narcolepsy without cataplexy(347.00)   . Regional enteritis of small intestine with large intestine   . Hypopotassemia   . Nausea with vomiting   .  Viral warts, unspecified   . Impotence of organic origin   . Cough   . Postinflammatory pulmonary fibrosis   . Cellulitis and abscess of unspecified site   . Dizziness and giddiness   . Dizziness and giddiness   . Other malaise and fatigue   . Diarrhea   . Bipolar I disorder, most recent episode (or current) unspecified   . Impacted cerumen   . Neuralgia, neuritis, and radiculitis, unspecified     Past Surgical History  Procedure Laterality Date  . Small intestine surgery      x 2  . Cholecystectomy  07-12-2010    Social History:   reports that he quit smoking about 8 months ago. His smoking use included Cigarettes. He started smoking about 56 years ago. He has a 49 pack-year smoking history. He has never used smokeless tobacco. He reports that he does not drink alcohol or use illicit drugs.  Family History  Problem Relation Age of Onset  . Diabetes Mother     maternal grandmother  . Pneumonia Maternal Grandmother   . Emphysema Father   . Colon cancer Neg Hx    Medications: Patient's Medications  New Prescriptions   No medications on file  Previous Medications   AMLODIPINE (NORVASC) 10 MG TABLET    Take 10 mg by mouth Nightly.     CALCIUM CARBONATE-VIT D-MIN 600-400 MG-UNIT TABS    Take 1 tablet by mouth 2 (two) times daily.     CHOLESTYRAMINE (QUESTRAN) 4 GM/DOSE POWDER    TAKE 1 SCOOPFUL DISSOLVED IN WATER ONCE DAILY.   DIPHENHYDRAMINE (BENADRYL) 25 MG CAPSULE    Take 1-2 capsules (25-50 mg total) by mouth once. Prior to infusion   DIVALPROEX (DEPAKOTE) 500 MG EC TABLET    Take 1,500 mg by mouth daily.    EPOETIN ALFA (PROCRIT) 92010 UNIT/ML INJECTION    Inject 10,000 Units into the skin every 14 (fourteen) days.    FERROUS SULFATE 325 (65 FE) MG TABLET    Take 325 mg by mouth 2 (two) times daily.     INFLIXIMAB (REMICADE) 100 MG INJECTION    Infuse Remicade IV schedule 1 30m/kg every 8 weeks Premedicate with Tylenol 500-6577mby mouth and Benadryl 25-5047my mouth prior  to infusion. Last PPD was on 12/2009.    LOPERAMIDE HCL (LOPERAMIDE A-D PO)    Take 1 capsule by mouth as needed.     LUTEIN 20 MG TABS    Take 1 tablet by mouth daily.     MAGNESIUM OXIDE (MAG-OX) 400 MG TABLET    Take 400 mg by mouth daily.     METOPROLOL SUCCINATE (TOPROL-XL) 25 MG 24 HR TABLET    Take 25 mg by mouth daily.  MULTIPLE VITAMIN (MULTIVITAMIN) TABLET    Take 1 tablet by mouth daily.     OLANZAPINE (ZYPREXA) 10 MG TABLET    Take 10 mg by mouth at bedtime.     OMEPRAZOLE (PRILOSEC) 20 MG CAPSULE    Take 20 mg by mouth daily.     OXYBUTYNIN (DITROPAN XL) 15 MG 24 HR TABLET    Take 15 mg by mouth daily.     TAMSULOSIN HCL (FLOMAX) 0.4 MG CAPS    Take 0.4 mg by mouth daily.   VITAMIN E 400 UNIT CAPSULE    Take 400 Units by mouth daily.    Modified Medications   No medications on file  Discontinued Medications   No medications on file   Physical Exam: Filed Vitals:   12/03/12 0842  BP: 130/72  Pulse: 82  Temp: 98.7 F (37.1 C)  TempSrc: Oral  Weight: 168 lb (76.204 kg)  SpO2: 97%  Physical Exam  Constitutional: He is oriented to person, place, and time. No distress.  Tall thin black male, nad  HENT:  Head: Normocephalic and atraumatic.  Right Ear: External ear normal.  Left Ear: External ear normal.  Nose: Nose normal.  Thick white mucus remains in mouth and oropharynx  Eyes: Conjunctivae and EOM are normal. Pupils are equal, round, and reactive to light.  Cardiovascular: Normal rate, regular rhythm, normal heart sounds and intact distal pulses.   Pulmonary/Chest: Effort normal and breath sounds normal. No respiratory distress.  Abdominal: Soft.  Hyperactive bowel sounds  Musculoskeletal: Normal range of motion.  Lymphadenopathy:    He has no cervical adenopathy.  Neurological: He is alert and oriented to person, place, and time.  Skin: Skin is warm and dry.    Labs reviewed: Basic Metabolic Panel:  Recent Labs  06/28/12 1400  NA 140  K 5.4*  CL  105  CO2 22  GLUCOSE 87  BUN 27  CREATININE 2.05*  CALCIUM 9.4  MG 1.7   Liver Function Tests:  Recent Labs  06/28/12 1400  AST 14  ALT 11  ALKPHOS 74  BILITOT 0.3  PROT 6.8  CBC:  Recent Labs  02/13/12 1501  08/06/12 1405 10/08/12 1220 11/05/12 1420  HGB 10.6*  < > 11.6* 10.4* 10.9*  HCT 32.7*  --   --   --   --   < > = values in this interval not displayed. Lipid Panel:  Recent Labs  06/28/12 1400  HDL 41  LDLCALC 59  TRIG 120  CHOLHDL 3.0   Past Procedures:  Assessment/Plan 1. Viral upper respiratory tract infection with cough -would like to know wbc count and renal function today due to this ongoing with decreased po intake--he is to get these at nephrology -encouraged fluids for this--seems it is almost resolved  2. Regional enteritis of unspecified site -Crohn's disease--seems he has more diarrhea with his viral illness, but no blood, mucus, or abdominal pain -po intake has been poor so I am concerned he is a bit dehydrated causing hs weakness  3. Chronic kidney disease (CKD), stage III (moderate) -will get his renal function checked this afternoon--may be worse due to his poor po intake, virus, recent fevers and diarrhea  4. Anemia -for hgb today and possible procrit injection  Labs/tests ordered:  I would like to see his results of his labs this afternoon including cbc, renal function panel due to his illness, diarrhea and decreased appetite from Dr. Serita Grit office  Next appt:  As scheduled in November  with regular labs before

## 2012-12-07 ENCOUNTER — Encounter (HOSPITAL_COMMUNITY): Payer: Medicare Other

## 2012-12-30 ENCOUNTER — Ambulatory Visit (INDEPENDENT_AMBULATORY_CARE_PROVIDER_SITE_OTHER): Payer: Medicare Other | Admitting: Internal Medicine

## 2012-12-30 ENCOUNTER — Ambulatory Visit: Payer: Medicare Other | Admitting: Internal Medicine

## 2012-12-30 ENCOUNTER — Encounter: Payer: Self-pay | Admitting: Internal Medicine

## 2012-12-30 VITALS — BP 120/62 | HR 70 | Temp 97.3°F | Wt 173.0 lb

## 2012-12-30 DIAGNOSIS — N400 Enlarged prostate without lower urinary tract symptoms: Secondary | ICD-10-CM

## 2012-12-30 DIAGNOSIS — I10 Essential (primary) hypertension: Secondary | ICD-10-CM

## 2012-12-30 DIAGNOSIS — N183 Chronic kidney disease, stage 3 unspecified: Secondary | ICD-10-CM

## 2012-12-30 DIAGNOSIS — D631 Anemia in chronic kidney disease: Secondary | ICD-10-CM

## 2012-12-30 DIAGNOSIS — K5090 Crohn's disease, unspecified, without complications: Secondary | ICD-10-CM

## 2012-12-30 NOTE — Progress Notes (Signed)
Patient ID: Brent [name], male   DOB: [birth year], 67 y.o.   MRN: [id number] Location:  [facility] / [name]   Allergies  Allergen Reactions  . Azathioprine     REACTION: affected WBC  . Ciprofloxacin     Pt denies any reaction  . Levaquin [Levofloxacin In D5w]     Pt denies any reaction  . Plendil [Felodipine]     Pt denies any reaction    Chief Complaint  Patient presents with  . Medical Managment of Chronic Issues    6 month follow-up     HPI: Patient is a 67 y.o. AA male seen in the office today for medical management of chronic issues. Seeing Dr. Posey Pronto at [facility] for stage three kidney disease. Pt. States that in the morning he feels good but by the afternoon he is very fatigued. States that it has been this way for about a year. Some days are worse than others. Today is a very good day according to the patient. Magox was increased due to low mag level.    Pt has a blood pressure machine at home and uses it daily. States that his BP has been in normal range. States there was one time that it was low but he called the [place] and spoke with a nurse there and was told that it was not low enough to be concerning.   Sees a pychiatrist at the [place] every 3 months for manic depression disorder and PTSD. Pt states that is well controlled. States that he keeps a close eye on this because about 7 years ago he had a manic episode and filed bankruptcy.   Has not needed procrit last 2 times.    Review of Systems:  Review of Systems  Constitutional: Positive for malaise/fatigue.  Eyes: Negative for blurred vision and double vision.  Respiratory: Negative for cough and shortness of breath.   Cardiovascular: Negative for chest pain.  Gastrointestinal: Negative for nausea, vomiting, diarrhea, constipation and blood in stool.  Genitourinary: Negative for frequency and hematuria.       D/t kidney disease Now on oxybutynin to help with urgency  Musculoskeletal:  Negative for falls.  Neurological: Negative for dizziness, tingling, tremors, sensory change, focal weakness, seizures and headaches.       Involuntary movement of the tongue d/t being on lithium   Psychiatric/Behavioral: Negative for depression. The patient is not nervous/anxious.        Medication controls that     Past Medical History  Diagnosis Date  . Crohn's   . Anxiety   . Depression   . Renal insufficiency   . Anemia   . Edema   . Other abnormal blood chemistry   . Malnutrition of mild degree   . Acute cholecystitis   . Abdominal pain, unspecified site   . Viremia, unspecified   . Pain in joint, lower leg   . Loss of weight   . Regional enteritis of small intestine   . Benign paroxysmal positional vertigo   . Other B-complex deficiencies   . Hypertensive renal disease, benign   . Other B-complex deficiencies   . Anemia, unspecified   . Other specified disease of white blood cells   . Chronic kidney disease, stage III (moderate)   . Unspecified disorder of skin and subcutaneous tissue   . Tobacco use disorder   . Celiac disease   . Essential and other specified forms of tremor   . Abdominal  pain, right lower quadrant   . Hypertrophy of prostate without urinary obstruction and other lower urinary tract symptoms (LUTS)   . Dysuria   . Dizziness and giddiness   . Encounter for long-term (current) use of other medications   . Iron deficiency anemia, unspecified   . Essential hypertension, benign   . Other extrapyramidal disease and abnormal movement disorder   . Pain in limb   . Other extrapyramidal disease and abnormal movement disorder   . Insomnia with sleep apnea, unspecified   . Insomnia with sleep apnea, unspecified   . Other specified disease of white blood cells   . Narcolepsy without cataplexy(347.00)   . Regional enteritis of small intestine with large intestine   . Hypopotassemia   . Nausea with vomiting   . Viral warts, unspecified   . Impotence of  organic origin   . Cough   . Postinflammatory pulmonary fibrosis   . Cellulitis and abscess of unspecified site   . Dizziness and giddiness   . Dizziness and giddiness   . Other malaise and fatigue   . Diarrhea   . Bipolar I disorder, most recent episode (or current) unspecified   . Impacted cerumen   . Neuralgia, neuritis, and radiculitis, unspecified     Past Surgical History  Procedure Laterality Date  . Small intestine surgery      x 2  . Cholecystectomy  07-12-2010    Social History:   reports that he quit smoking about 9 months ago. His smoking use included Cigarettes. He started smoking about 56 years ago. He has a 49 pack-year smoking history. He has never used smokeless tobacco. He reports that he does not drink alcohol or use illicit drugs.  Family History  Problem Relation Age of Onset  . Diabetes Mother     maternal grandmother  . Pneumonia Maternal Grandmother   . Emphysema Father   . Colon cancer Neg Hx     Medications: Patient's Medications  New Prescriptions   No medications on file  Previous Medications   AMLODIPINE (NORVASC) 10 MG TABLET    Take 10 mg by mouth Nightly.     CALCIUM CARBONATE-VIT D-MIN 600-400 MG-UNIT TABS    Take 1 tablet by mouth 2 (two) times daily.     CHOLESTYRAMINE (QUESTRAN) 4 GM/DOSE POWDER    TAKE 1 SCOOPFUL DISSOLVED IN WATER ONCE DAILY.   DIPHENHYDRAMINE (BENADRYL) 25 MG CAPSULE    Take 1-2 capsules (25-50 mg total) by mouth once. Prior to infusion   DIVALPROEX (DEPAKOTE) 500 MG EC TABLET    Take 1,500 mg by mouth daily.    EPOETIN ALFA (PROCRIT) 46270 UNIT/ML INJECTION    Inject 10,000 Units into the skin every 14 (fourteen) days.    FERROUS SULFATE 325 (65 FE) MG TABLET    Take 325 mg by mouth 2 (two) times daily.     INFLIXIMAB (REMICADE) 100 MG INJECTION    Infuse Remicade IV schedule 1 86m/kg every 8 weeks Premedicate with Tylenol 500-6540mby mouth and Benadryl 25-5060my mouth prior to infusion. Last PPD was on 12/2009.     LOPERAMIDE HCL (LOPERAMIDE A-D PO)    Take 1 capsule by mouth as needed.     MAGNESIUM OXIDE (MAG-OX) 400 MG TABLET    Take 400 mg by mouth 2 (two) times daily.    METOPROLOL SUCCINATE (TOPROL-XL) 25 MG 24 HR TABLET    Take 25 mg by mouth daily.     MULTIPLE VITAMIN (MULTIVITAMIN) TABLET  Take 1 tablet by mouth daily.     OLANZAPINE (ZYPREXA) 10 MG TABLET    Take 10 mg by mouth at bedtime.     OMEPRAZOLE (PRILOSEC) 20 MG CAPSULE    Take 20 mg by mouth daily.     OXYBUTYNIN (DITROPAN XL) 15 MG 24 HR TABLET    Take 15 mg by mouth daily.     TAMSULOSIN HCL (FLOMAX) 0.4 MG CAPS    Take 0.4 mg by mouth daily.   VITAMIN E 400 UNIT CAPSULE    Take 400 Units by mouth daily.    Modified Medications   No medications on file  Discontinued Medications   LUTEIN 20 MG TABS    Take 1 tablet by mouth daily.       Physical Exam: Filed Vitals:   12/30/12 1306  BP: 120/62  Pulse: 70  Temp: 97.3 F (36.3 C)  TempSrc: Oral  Weight: 173 lb (78.472 kg)  SpO2: 96%   Physical Exam  Constitutional: He is oriented to person, place, and time. He appears well-developed and well-nourished.  Tall and slender   Eyes: EOM are normal. Pupils are equal, round, and reactive to light.  Neck: Normal range of motion.  Cardiovascular: Normal rate, regular rhythm, normal heart sounds and intact distal pulses.   Pulmonary/Chest: Effort normal and breath sounds normal.  Abdominal: Soft. Bowel sounds are normal.  Neurological: He is alert and oriented to person, place, and time. He has normal strength and normal reflexes. He displays a negative Romberg sign.  Abnormal involuntary movement of the tongue.   Skin: Skin is warm.  Psychiatric: He has a normal mood and affect. His behavior is normal. Judgment and thought content normal.   Labs reviewed: Basic Metabolic Panel:  Recent Labs  06/28/12 1400 12/03/12 1530  NA 140 140  K 5.4* 4.0  CL 105 109  CO2 22 21  GLUCOSE 87 103*  BUN 27 17  CREATININE 2.05*  1.74*  CALCIUM 9.4 8.8  MG 1.7 1.4*  PHOS  --  2.1*   Liver Function Tests:  Recent Labs  06/28/12 1400 12/03/12 1530  AST 14  --   ALT 11  --   ALKPHOS 74  --   BILITOT 0.3  --   PROT 6.8  --   ALBUMIN  --  3.1*   CBC:  Recent Labs  02/13/12 1501  10/08/12 1220 11/05/12 1420 12/03/12 1530  WBC  --   --   --   --  8.3  HGB 10.6*  < > 10.4* 10.9* 11.7*  HCT 32.7*  --   --   --  35.4*  MCV  --   --   --   --  101.1*  PLT  --   --   --   --  135*  < > = values in this interval not displayed. Lipid Panel:  Recent Labs  06/28/12 1400  HDL 41  LDLCALC 59  TRIG 120  CHOLHDL 3.0    Assessment/Plan 1. Essential hypertension, benign -at goal with current medications  2. Chronic kidney disease (CKD), stage III (moderate) -has been stable recently -follows with Dr. Posey Pronto at [facility]  3. Anemia in chronic kidney disease(285.21) -last two draws have had hgb >11.5 so no procrit has been needed  4. Regional enteritis of unspecified site -follows with GI at [facility] -had a period of loose stools, but this has since resolved  5. BPH (benign prostatic hyperplasia) -urinary frequency is controlled  with flomax  Next appt:  3 mos

## 2012-12-31 ENCOUNTER — Encounter (HOSPITAL_COMMUNITY): Payer: Self-pay

## 2012-12-31 ENCOUNTER — Encounter (HOSPITAL_COMMUNITY)
Admission: RE | Admit: 2012-12-31 | Discharge: 2012-12-31 | Disposition: A | Discharge status: Home / Self Care | Payer: Medicare Other | Source: Ambulatory Visit | Attending: Gastroenterology | Admitting: Gastroenterology

## 2012-12-31 VITALS — BP 118/69 | HR 75 | Temp 98.0°F | Resp 16 | Ht 76.0 in | Wt 173.0 lb

## 2012-12-31 DIAGNOSIS — D649 Anemia, unspecified: Secondary | ICD-10-CM

## 2012-12-31 DIAGNOSIS — D631 Anemia in chronic kidney disease: Secondary | ICD-10-CM | POA: Insufficient documentation

## 2012-12-31 DIAGNOSIS — N183 Chronic kidney disease, stage 3 unspecified: Secondary | ICD-10-CM | POA: Insufficient documentation

## 2012-12-31 LAB — IRON AND TIBC
Saturation Ratios: 24 % (ref 20–55)
TIBC: 190 ug/dL — ABNORMAL LOW (ref 215–435)
UIBC: 144 ug/dL (ref 125–400)

## 2012-12-31 MED ORDER — EPOETIN ALFA 10000 UNIT/ML IJ SOLN
20000.0000 [IU] | INTRAMUSCULAR | Status: DC
  Filled 2012-12-31: qty 2

## 2012-12-31 MED ORDER — EPOETIN ALFA 20000 UNIT/ML IJ SOLN
20000.0000 [IU] | Freq: Once | INTRAMUSCULAR | Status: AC
  Administered 2012-12-31: 20000 [IU] via SUBCUTANEOUS
  Filled 2012-12-31: qty 1

## 2013-01-06 ENCOUNTER — Telehealth: Payer: Self-pay

## 2013-01-06 NOTE — Telephone Encounter (Signed)
Message copied by Barron Alvine on Thu Jan 06, 2013  8:16 AM ------      Message from: Barron Alvine      Created: Wed Jan 07, 2012  2:06 PM       Pt needs annual TB  ------

## 2013-01-06 NOTE — Telephone Encounter (Signed)
Pt has been scheduled for his annual TB skin test

## 2013-01-07 ENCOUNTER — Ambulatory Visit (INDEPENDENT_AMBULATORY_CARE_PROVIDER_SITE_OTHER): Payer: Medicare Other | Admitting: Gastroenterology

## 2013-01-07 DIAGNOSIS — K5090 Crohn's disease, unspecified, without complications: Secondary | ICD-10-CM

## 2013-01-10 LAB — TB SKIN TEST: TB Skin Test: NEGATIVE

## 2013-01-28 ENCOUNTER — Encounter (HOSPITAL_COMMUNITY): Payer: Self-pay

## 2013-01-28 ENCOUNTER — Encounter (HOSPITAL_COMMUNITY)
Admission: RE | Admit: 2013-01-28 | Discharge: 2013-01-28 | Disposition: A | Discharge status: Home / Self Care | Payer: Medicare Other | Source: Ambulatory Visit | Attending: Gastroenterology | Admitting: Gastroenterology

## 2013-01-28 VITALS — BP 123/73 | HR 84 | Temp 98.9°F | Resp 18 | Ht 76.0 in | Wt 173.0 lb

## 2013-01-28 DIAGNOSIS — D649 Anemia, unspecified: Secondary | ICD-10-CM | POA: Insufficient documentation

## 2013-01-28 DIAGNOSIS — N189 Chronic kidney disease, unspecified: Secondary | ICD-10-CM | POA: Insufficient documentation

## 2013-01-28 LAB — IRON AND TIBC: Saturation Ratios: 22 % (ref 20–55)

## 2013-01-28 MED ORDER — EPOETIN ALFA 20000 UNIT/ML IJ SOLN
20000.0000 [IU] | INTRAMUSCULAR | Status: DC
  Administered 2013-01-28: 20000 [IU] via SUBCUTANEOUS

## 2013-02-25 ENCOUNTER — Encounter (HOSPITAL_COMMUNITY): Payer: Medicare Other

## 2013-02-28 ENCOUNTER — Ambulatory Visit: Payer: Medicare Other | Admitting: Internal Medicine

## 2013-03-04 ENCOUNTER — Encounter (HOSPITAL_COMMUNITY): Payer: Medicare Other

## 2013-03-10 ENCOUNTER — Encounter (HOSPITAL_COMMUNITY)
Admission: RE | Admit: 2013-03-10 | Discharge: 2013-03-10 | Disposition: A | Discharge status: Home / Self Care | Payer: Medicare Other | Source: Ambulatory Visit | Attending: Gastroenterology | Admitting: Gastroenterology

## 2013-03-10 ENCOUNTER — Encounter (HOSPITAL_COMMUNITY): Payer: Self-pay

## 2013-03-10 VITALS — BP 130/82 | HR 86 | Temp 98.0°F | Resp 18

## 2013-03-10 DIAGNOSIS — Z5181 Encounter for therapeutic drug level monitoring: Secondary | ICD-10-CM | POA: Insufficient documentation

## 2013-03-10 DIAGNOSIS — N039 Chronic nephritic syndrome with unspecified morphologic changes: Secondary | ICD-10-CM

## 2013-03-10 DIAGNOSIS — D631 Anemia in chronic kidney disease: Secondary | ICD-10-CM | POA: Insufficient documentation

## 2013-03-10 DIAGNOSIS — N183 Chronic kidney disease, stage 3 unspecified: Secondary | ICD-10-CM | POA: Diagnosis not present

## 2013-03-10 DIAGNOSIS — D649 Anemia, unspecified: Secondary | ICD-10-CM

## 2013-03-10 LAB — IRON AND TIBC
IRON: 71 ug/dL (ref 42–135)
Saturation Ratios: 33 % (ref 20–55)
TIBC: 217 ug/dL (ref 215–435)
UIBC: 146 ug/dL (ref 125–400)

## 2013-03-10 LAB — FERRITIN: Ferritin: 931 ng/mL — ABNORMAL HIGH (ref 22–322)

## 2013-03-10 LAB — HEMOGLOBIN: Hemoglobin: 11.4 g/dL — ABNORMAL LOW (ref 13.0–17.0)

## 2013-03-10 MED ORDER — EPOETIN ALFA 10000 UNIT/ML IJ SOLN
20000.0000 [IU] | INTRAMUSCULAR | Status: DC
  Administered 2013-03-10: 20000 [IU] via SUBCUTANEOUS
  Filled 2013-03-10: qty 2

## 2013-03-10 NOTE — Discharge Instructions (Signed)
Epoetin Alfa injection What is this medicine? EPOETIN ALFA (e POE e tin AL fa) helps your body make more red blood cells. This medicine is used to treat anemia caused by chronic kidney failure, cancer chemotherapy, or HIV-therapy. It may also be used before surgery if you have anemia. This medicine may be used for other purposes; ask your health care provider or pharmacist if you have questions. COMMON BRAND NAME(S): Epogen, Procrit What should I tell my health care provider before I take this medicine? They need to know if you have any of these conditions: -blood clotting disorders -cancer patient not on chemotherapy -cystic fibrosis -heart disease, such as angina or heart failure -hemoglobin level of 12 g/dL or greater -high blood pressure -low levels of folate, iron, or vitamin B12 -seizures -an unusual or allergic reaction to erythropoietin, albumin, benzyl alcohol, hamster proteins, other medicines, foods, dyes, or preservatives -pregnant or trying to get pregnant -breast-feeding How should I use this medicine? This medicine is for injection into a vein or under the skin. It is usually given by a health care professional in a hospital or clinic setting. If you get this medicine at home, you will be taught how to prepare and give this medicine. Use exactly as directed. Take your medicine at regular intervals. Do not take your medicine more often than directed. It is important that you put your used needles and syringes in a special sharps container. Do not put them in a trash can. If you do not have a sharps container, call your pharmacist or healthcare provider to get one. Talk to your pediatrician regarding the use of this medicine in children. While this drug may be prescribed for selected conditions, precautions do apply. Overdosage: If you think you have taken too much of this medicine contact a poison control center or emergency room at once. NOTE: This medicine is only for you. Do  not share this medicine with others. What if I miss a dose? If you miss a dose, take it as soon as you can. If it is almost time for your next dose, take only that dose. Do not take double or extra doses. What may interact with this medicine? Do not take this medicine with any of the following medications: -darbepoetin alfa This list may not describe all possible interactions. Give your health care provider a list of all the medicines, herbs, non-prescription drugs, or dietary supplements you use. Also tell them if you smoke, drink alcohol, or use illegal drugs. Some items may interact with your medicine. What should I watch for while using this medicine? Visit your prescriber or health care professional for regular checks on your progress and for the needed blood tests and blood pressure measurements. It is especially important for the doctor to make sure your hemoglobin level is in the desired range, to limit the risk of potential side effects and to give you the best benefit. Keep all appointments for any recommended tests. Check your blood pressure as directed. Ask your doctor what your blood pressure should be and when you should contact him or her. As your body makes more red blood cells, you may need to take iron, folic acid, or vitamin B supplements. Ask your doctor or health care provider which products are right for you. If you have kidney disease continue dietary restrictions, even though this medication can make you feel better. Talk with your doctor or health care professional about the foods you eat and the vitamins that you take. What  side effects may I notice from receiving this medicine? Side effects that you should report to your doctor or health care professional as soon as possible: -allergic reactions like skin rash, itching or hives, swelling of the face, lips, or tongue -breathing problems -changes in vision -chest pain -confusion, trouble speaking or understanding -feeling  faint or lightheaded, falls -high blood pressure -muscle aches or pains -pain, swelling, warmth in the leg -rapid weight gain -severe headaches -sudden numbness or weakness of the face, arm or leg -trouble walking, dizziness, loss of balance or coordination -seizures (convulsions) -swelling of the ankles, feet, hands -unusually weak or tired Side effects that usually do not require medical attention (report to your doctor or health care professional if they continue or are bothersome): -diarrhea -fever, chills (flu-like symptoms) -headaches -nausea, vomiting -redness, stinging, or swelling at site where injected This list may not describe all possible side effects. Call your doctor for medical advice about side effects. You may report side effects to FDA at 1-800-FDA-1088. Where should I keep my medicine? Keep out of the reach of children. Store in a refrigerator between 2 and 8 degrees C (36 and 46 degrees F). Do not freeze or shake. Throw away any unused portion if using a single-dose vial. Multi-dose vials can be kept in the refrigerator for up to 21 days after the initial dose. Throw away unused medicine. NOTE: This sheet is a summary. It may not cover all possible information. If you have questions about this medicine, talk to your doctor, pharmacist, or health care provider.  2014, Elsevier/Gold Standard. (2008-01-25 10:25:44)

## 2013-03-31 ENCOUNTER — Encounter: Payer: Self-pay | Admitting: Internal Medicine

## 2013-03-31 ENCOUNTER — Ambulatory Visit (INDEPENDENT_AMBULATORY_CARE_PROVIDER_SITE_OTHER): Payer: Medicare Other | Admitting: Internal Medicine

## 2013-03-31 VITALS — BP 124/80 | HR 86 | Temp 98.0°F | Resp 16 | Wt 169.6 lb

## 2013-03-31 DIAGNOSIS — Z23 Encounter for immunization: Secondary | ICD-10-CM

## 2013-03-31 DIAGNOSIS — N039 Chronic nephritic syndrome with unspecified morphologic changes: Secondary | ICD-10-CM

## 2013-03-31 DIAGNOSIS — N189 Chronic kidney disease, unspecified: Secondary | ICD-10-CM

## 2013-03-31 DIAGNOSIS — K5090 Crohn's disease, unspecified, without complications: Secondary | ICD-10-CM

## 2013-03-31 DIAGNOSIS — D631 Anemia in chronic kidney disease: Secondary | ICD-10-CM | POA: Insufficient documentation

## 2013-03-31 DIAGNOSIS — N183 Chronic kidney disease, stage 3 unspecified: Secondary | ICD-10-CM

## 2013-03-31 DIAGNOSIS — I10 Essential (primary) hypertension: Secondary | ICD-10-CM | POA: Insufficient documentation

## 2013-03-31 DIAGNOSIS — N400 Enlarged prostate without lower urinary tract symptoms: Secondary | ICD-10-CM

## 2013-03-31 MED ORDER — ZOSTER VACCINE LIVE 19400 UNT/0.65ML ~~LOC~~ SOLR: 0.6500 mL | Freq: Once | SUBCUTANEOUS | Status: DC
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
## 2013-04-01 ENCOUNTER — Encounter (HOSPITAL_COMMUNITY): Payer: Medicare Other

## 2013-04-07 ENCOUNTER — Other Ambulatory Visit (HOSPITAL_COMMUNITY): Payer: Self-pay | Admitting: Nephrology

## 2013-04-07 ENCOUNTER — Encounter (HOSPITAL_COMMUNITY)
Admission: RE | Admit: 2013-04-07 | Discharge: 2013-04-07 | Disposition: A | Discharge status: Home / Self Care | Payer: Medicare Other | Source: Ambulatory Visit | Attending: Gastroenterology | Admitting: Gastroenterology

## 2013-04-07 VITALS — BP 117/75 | HR 88 | Temp 97.1°F | Resp 16 | Ht 76.0 in | Wt 169.0 lb

## 2013-04-07 DIAGNOSIS — D649 Anemia, unspecified: Secondary | ICD-10-CM

## 2013-04-07 LAB — FERRITIN: FERRITIN: 733 ng/mL — AB (ref 22–322)

## 2013-04-07 LAB — IRON AND TIBC
IRON: 68 ug/dL (ref 42–135)
SATURATION RATIOS: 33 % (ref 20–55)
TIBC: 204 ug/dL — AB (ref 215–435)
UIBC: 136 ug/dL (ref 125–400)

## 2013-04-07 LAB — HEMOGLOBIN: Hemoglobin: 12.1 g/dL — ABNORMAL LOW (ref 13.0–17.0)

## 2013-04-07 NOTE — Discharge Instructions (Signed)

## 2013-05-05 ENCOUNTER — Encounter (HOSPITAL_COMMUNITY): Payer: Medicare Other

## 2013-05-06 ENCOUNTER — Encounter (HOSPITAL_COMMUNITY): Payer: Self-pay

## 2013-05-06 ENCOUNTER — Encounter (HOSPITAL_COMMUNITY)
Admission: RE | Admit: 2013-05-06 | Discharge: 2013-05-06 | Disposition: A | Discharge status: Home / Self Care | Payer: Medicare Other | Source: Ambulatory Visit | Attending: Gastroenterology | Admitting: Gastroenterology

## 2013-05-06 VITALS — BP 122/77 | HR 73 | Temp 98.0°F | Resp 16

## 2013-05-06 DIAGNOSIS — D649 Anemia, unspecified: Secondary | ICD-10-CM | POA: Insufficient documentation

## 2013-05-06 LAB — IRON AND TIBC
Iron: 49 ug/dL (ref 42–135)
Saturation Ratios: 22 % (ref 20–55)
TIBC: 222 ug/dL (ref 215–435)
UIBC: 173 ug/dL (ref 125–400)

## 2013-05-06 LAB — FERRITIN: Ferritin: 789 ng/mL — ABNORMAL HIGH (ref 22–322)

## 2013-05-06 LAB — HEMOGLOBIN: HEMOGLOBIN: 10.7 g/dL — AB (ref 13.0–17.0)

## 2013-05-06 MED ORDER — EPOETIN ALFA 20000 UNIT/ML IJ SOLN
20000.0000 [IU] | INTRAMUSCULAR | Status: DC
  Administered 2013-05-06: 20000 [IU] via SUBCUTANEOUS
  Filled 2013-05-06: qty 1

## 2013-06-02 ENCOUNTER — Encounter (HOSPITAL_COMMUNITY)
Admission: RE | Admit: 2013-06-02 | Discharge: 2013-06-02 | Disposition: A | Discharge status: Home / Self Care | Payer: Medicare Other | Source: Ambulatory Visit | Attending: Gastroenterology | Admitting: Gastroenterology

## 2013-06-02 ENCOUNTER — Encounter (HOSPITAL_COMMUNITY): Payer: Self-pay

## 2013-06-02 VITALS — BP 141/82 | HR 77 | Temp 98.0°F | Resp 18

## 2013-06-02 DIAGNOSIS — D649 Anemia, unspecified: Secondary | ICD-10-CM | POA: Insufficient documentation

## 2013-06-02 LAB — IRON AND TIBC
Iron: 64 ug/dL (ref 42–135)
Saturation Ratios: 29 % (ref 20–55)
TIBC: 224 ug/dL (ref 215–435)
UIBC: 160 ug/dL (ref 125–400)

## 2013-06-02 LAB — HEMOGLOBIN: Hemoglobin: 11.5 g/dL — ABNORMAL LOW (ref 13.0–17.0)

## 2013-06-02 MED ORDER — EPOETIN ALFA 10000 UNIT/ML IJ SOLN: 20000.0000 [IU] | INTRAMUSCULAR | Status: DC

## 2013-06-02 NOTE — Progress Notes (Signed)
RN had read Hgb result on 06/02/13 as 11.1. Writer released order for procrit 20,000. RN received phone call from pharmacist stating today's Hgb result was 11.5. Pharmacist discontinued order for procrit.

## 2013-06-02 NOTE — Discharge Instructions (Signed)
Darbepoetin Alfa injection What is this medicine? DARBEPOETIN ALFA (dar be POE e tin AL fa) helps your body make more red blood cells. It is used to treat anemia caused by chronic kidney failure and chemotherapy. This medicine may be used for other purposes; ask your health care provider or pharmacist if you have questions. COMMON BRAND NAME(S): Aranesp What should I tell my health care provider before I take this medicine? They need to know if you have any of these conditions: -blood clotting disorders or history of blood clots -cancer patient not on chemotherapy -cystic fibrosis -heart disease, such as angina, heart failure, or a history of a heart attack -hemoglobin level of 12 g/dL or greater -high blood pressure -low levels of folate, iron, or vitamin B12 -seizures -an unusual or allergic reaction to darbepoetin, erythropoietin, albumin, hamster proteins, latex, other medicines, foods, dyes, or preservatives -pregnant or trying to get pregnant -breast-feeding How should I use this medicine? This medicine is for injection into a vein or under the skin. It is usually given by a health care professional in a hospital or clinic setting. If you get this medicine at home, you will be taught how to prepare and give this medicine. Do not shake the solution before you withdraw a dose. Use exactly as directed. Take your medicine at regular intervals. Do not take your medicine more often than directed. It is important that you put your used needles and syringes in a special sharps container. Do not put them in a trash can. If you do not have a sharps container, call your pharmacist or healthcare provider to get one. Talk to your pediatrician regarding the use of this medicine in children. While this medicine may be used in children as young as 1 year for selected conditions, precautions do apply. Overdosage: If you think you have taken too much of this medicine contact a poison control center or  emergency room at once. NOTE: This medicine is only for you. Do not share this medicine with others. What if I miss a dose? If you miss a dose, take it as soon as you can. If it is almost time for your next dose, take only that dose. Do not take double or extra doses. What may interact with this medicine? Do not take this medicine with any of the following medications: -epoetin alfa This list may not describe all possible interactions. Give your health care provider a list of all the medicines, herbs, non-prescription drugs, or dietary supplements you use. Also tell them if you smoke, drink alcohol, or use illegal drugs. Some items may interact with your medicine. What should I watch for while using this medicine? Visit your prescriber or health care professional for regular checks on your progress and for the needed blood tests and blood pressure measurements. It is especially important for the doctor to make sure your hemoglobin level is in the desired range, to limit the risk of potential side effects and to give you the best benefit. Keep all appointments for any recommended tests. Check your blood pressure as directed. Ask your doctor what your blood pressure should be and when you should contact him or her. As your body makes more red blood cells, you may need to take iron, folic acid, or vitamin B supplements. Ask your doctor or health care provider which products are right for you. If you have kidney disease continue dietary restrictions, even though this medication can make you feel better. Talk with your doctor or health  care professional about the foods you eat and the vitamins that you take. What side effects may I notice from receiving this medicine? Side effects that you should report to your doctor or health care professional as soon as possible: -allergic reactions like skin rash, itching or hives, swelling of the face, lips, or tongue -breathing problems -changes in vision -chest  pain -confusion, trouble speaking or understanding -feeling faint or lightheaded, falls -high blood pressure -muscle aches or pains -pain, swelling, warmth in the leg -rapid weight gain -severe headaches -sudden numbness or weakness of the face, arm or leg -trouble walking, dizziness, loss of balance or coordination -seizures (convulsions) -swelling of the ankles, feet, hands -unusually weak or tired Side effects that usually do not require medical attention (report to your doctor or health care professional if they continue or are bothersome): -diarrhea -fever, chills (flu-like symptoms) -headaches -nausea, vomiting -redness, stinging, or swelling at site where injected This list may not describe all possible side effects. Call your doctor for medical advice about side effects. You may report side effects to FDA at 1-800-FDA-1088. Where should I keep my medicine? Keep out of the reach of children. Store in a refrigerator between 2 and 8 degrees C (36 and 46 degrees F). Do not freeze. Do not shake. Throw away any unused portion if using a single-dose vial. Throw away any unused medicine after the expiration date. NOTE: This sheet is a summary. It may not cover all possible information. If you have questions about this medicine, talk to your doctor, pharmacist, or health care provider.  2014, [employer] Standard. (2008-01-25 10:23:57)

## 2013-06-03 LAB — FERRITIN: FERRITIN: 898 ng/mL — AB (ref 22–322)

## 2013-06-30 ENCOUNTER — Encounter (HOSPITAL_COMMUNITY): Payer: Self-pay

## 2013-06-30 ENCOUNTER — Encounter (HOSPITAL_COMMUNITY): Payer: Medicare Other

## 2013-06-30 ENCOUNTER — Encounter (HOSPITAL_COMMUNITY)
Admission: RE | Admit: 2013-06-30 | Discharge: 2013-06-30 | Disposition: A | Discharge status: Home / Self Care | Payer: Medicare Other | Source: Ambulatory Visit | Attending: Gastroenterology | Admitting: Gastroenterology

## 2013-06-30 ENCOUNTER — Other Ambulatory Visit (HOSPITAL_COMMUNITY): Payer: Self-pay | Admitting: Nephrology

## 2013-06-30 VITALS — BP 134/84 | HR 71 | Temp 97.3°F | Resp 18

## 2013-06-30 DIAGNOSIS — D649 Anemia, unspecified: Secondary | ICD-10-CM | POA: Insufficient documentation

## 2013-06-30 LAB — HEMOGLOBIN: HEMOGLOBIN: 11.4 g/dL — AB (ref 13.0–17.0)

## 2013-06-30 LAB — IRON AND TIBC
Iron: 64 ug/dL (ref 42–135)
Saturation Ratios: 30 % (ref 20–55)
TIBC: 214 ug/dL — ABNORMAL LOW (ref 215–435)
UIBC: 150 ug/dL (ref 125–400)

## 2013-06-30 LAB — FERRITIN: Ferritin: 959 ng/mL — ABNORMAL HIGH (ref 22–322)

## 2013-06-30 MED ORDER — EPOETIN ALFA 20000 UNIT/ML IJ SOLN
20000.0000 [IU] | INTRAMUSCULAR | Status: DC
Start: 2013-06-30 — End: 2013-07-01
  Administered 2013-06-30: 20000 [IU] via SUBCUTANEOUS
  Filled 2013-06-30: qty 1

## 2013-07-24 ENCOUNTER — Encounter: Payer: Self-pay | Admitting: Gastroenterology

## 2013-07-28 ENCOUNTER — Encounter (HOSPITAL_COMMUNITY): Payer: Self-pay

## 2013-07-28 ENCOUNTER — Encounter (HOSPITAL_COMMUNITY)
Admission: RE | Admit: 2013-07-28 | Discharge: 2013-07-28 | Disposition: A | Discharge status: Home / Self Care | Payer: Medicare Other | Source: Ambulatory Visit | Attending: Gastroenterology | Admitting: Gastroenterology

## 2013-07-28 VITALS — BP 123/76 | HR 78 | Temp 96.9°F | Resp 18 | Ht 76.0 in | Wt 172.0 lb

## 2013-07-28 DIAGNOSIS — D649 Anemia, unspecified: Secondary | ICD-10-CM

## 2013-07-28 LAB — IRON AND TIBC
IRON: 53 ug/dL (ref 42–135)
SATURATION RATIOS: 26 % (ref 20–55)
TIBC: 206 ug/dL — ABNORMAL LOW (ref 215–435)
UIBC: 153 ug/dL (ref 125–400)

## 2013-07-28 LAB — FERRITIN: Ferritin: 969 ng/mL — ABNORMAL HIGH (ref 22–322)

## 2013-07-28 LAB — HEMOGLOBIN: Hemoglobin: 11.8 g/dL — ABNORMAL LOW (ref 13.0–17.0)

## 2013-08-01 ENCOUNTER — Ambulatory Visit: Payer: Medicare Other | Admitting: Internal Medicine

## 2013-08-25 ENCOUNTER — Encounter (HOSPITAL_COMMUNITY): Payer: Self-pay

## 2013-08-25 ENCOUNTER — Encounter (HOSPITAL_COMMUNITY)
Admission: RE | Admit: 2013-08-25 | Discharge: 2013-08-25 | Disposition: A | Discharge status: Home / Self Care | Payer: Medicare Other | Source: Ambulatory Visit | Attending: Gastroenterology | Admitting: Gastroenterology

## 2013-08-25 DIAGNOSIS — N183 Chronic kidney disease, stage 3 unspecified: Secondary | ICD-10-CM | POA: Diagnosis not present

## 2013-08-25 DIAGNOSIS — D631 Anemia in chronic kidney disease: Secondary | ICD-10-CM | POA: Insufficient documentation

## 2013-08-25 DIAGNOSIS — N039 Chronic nephritic syndrome with unspecified morphologic changes: Principal | ICD-10-CM

## 2013-08-25 LAB — HEMOGLOBIN: HEMOGLOBIN: 11.4 g/dL — AB (ref 13.0–17.0)

## 2013-08-25 LAB — IRON AND TIBC
Iron: 93 ug/dL (ref 42–135)
Saturation Ratios: 42 % (ref 20–55)
TIBC: 224 ug/dL (ref 215–435)
UIBC: 131 ug/dL (ref 125–400)

## 2013-08-25 LAB — FERRITIN: FERRITIN: 1104 ng/mL — AB (ref 22–322)

## 2013-08-25 MED ORDER — EPOETIN ALFA 20000 UNIT/ML IJ SOLN
20000.0000 [IU] | INTRAMUSCULAR | Status: DC
  Administered 2013-08-25: 20000 [IU] via SUBCUTANEOUS

## 2013-09-05 ENCOUNTER — Ambulatory Visit (INDEPENDENT_AMBULATORY_CARE_PROVIDER_SITE_OTHER): Payer: Medicare Other | Admitting: Internal Medicine

## 2013-09-05 ENCOUNTER — Encounter: Payer: Self-pay | Admitting: Internal Medicine

## 2013-09-05 VITALS — BP 116/70 | HR 74 | Temp 98.1°F | Wt 172.8 lb

## 2013-09-05 DIAGNOSIS — D631 Anemia in chronic kidney disease: Secondary | ICD-10-CM

## 2013-09-05 DIAGNOSIS — Z23 Encounter for immunization: Secondary | ICD-10-CM

## 2013-09-05 DIAGNOSIS — N039 Chronic nephritic syndrome with unspecified morphologic changes: Secondary | ICD-10-CM

## 2013-09-05 DIAGNOSIS — K5090 Crohn's disease, unspecified, without complications: Secondary | ICD-10-CM

## 2013-09-05 DIAGNOSIS — N400 Enlarged prostate without lower urinary tract symptoms: Secondary | ICD-10-CM

## 2013-09-05 DIAGNOSIS — I10 Essential (primary) hypertension: Secondary | ICD-10-CM

## 2013-09-05 DIAGNOSIS — N183 Chronic kidney disease, stage 3 unspecified: Secondary | ICD-10-CM

## 2013-09-05 MED ORDER — ZOSTER VACCINE LIVE 19400 UNT/0.65ML ~~LOC~~ SOLR: 0.6500 mL | Freq: Once | SUBCUTANEOUS | Status: DC

## 2013-09-05 NOTE — Patient Instructions (Signed)
Please bring [name] a copy of your living will and health care power of attorney.

## 2013-09-05 NOTE — Progress Notes (Signed)
Patient ID: Brent [name], male   DOB: 08-10-1945, 68 y.o.   MRN: [id number]   Location:  [facility]  Code Status: information given again today and he says he does have living will and health care power of attorney and will bring Brent [name] a copy  Allergies  Allergen Reactions  . Azathioprine     REACTION: affected WBC  . Ciprofloxacin     Pt denies any reaction  . Levaquin [Levofloxacin In D5w]     Pt denies any reaction  . Plendil [Felodipine]     Pt denies any reaction    Chief Complaint  Patient presents with  . Follow-up    4 month f/u & [facility] RX form   . Immunizations    shingles to be printed  . Diarrhea    diarrhea worse than usual    HPI: Patient is a 68 y.o. black male seen in the office today for med mgt chronic diseases.    Having diarrhea.  Had stopped his cholestyramine and seemed to be cause.  Has been using loperamide but could still not keep up with it.  Knows to take the cholestyramine separate from other meds.  Has restarted and improved.   Did feel pretty weak when this was going on, but better now.  Nothing else going on.  No problems with hematuria.  Not dark--knows to drink more water when it is dark.  Urgency better, but still has nocturia.  Had bladder emptying study which was normal at [name] per pt.    Still getting remicade every 8 weeks for his Crohn's disease.    Acid reflux controlled.    BP at goal.  Checks at home and normal there, too.  Has not been needing procrit 3x, but now was 11.4 so needed injection.  Dr. Posey Pronto sees at [name].  Does not have upcoming appt that he can remember.    [facility]    Review of Systems:  Review of Systems  Constitutional: Negative for fever, chills and malaise/fatigue.  HENT: Negative for congestion.   Eyes: Negative for blurred vision.  Respiratory: Negative for shortness of breath.   Cardiovascular: Negative for chest pain.    Gastrointestinal: Positive for diarrhea. Negative for blood in stool and melena.  Genitourinary: Positive for frequency. Negative for dysuria and urgency.  Musculoskeletal: Negative for falls and myalgias.  Skin: Negative for rash.  Neurological: Negative for dizziness, loss of consciousness, weakness and headaches.  Psychiatric/Behavioral: Negative for depression and memory loss.    Past Medical History  Diagnosis Date  . Crohn's   . Anxiety   . Depression   . Renal insufficiency   . Anemia   . Edema   . Other abnormal blood chemistry   . Malnutrition of mild degree   . Acute cholecystitis   . Abdominal pain, unspecified site   . Viremia, unspecified   . Pain in joint, lower leg   . Loss of weight   . Regional enteritis of small intestine   . Benign paroxysmal positional vertigo   . Other B-complex deficiencies   . Hypertensive renal disease, benign   . Other B-complex deficiencies   . Anemia, unspecified   . Other specified disease of white blood cells   . Chronic kidney disease, stage III (moderate)   . Unspecified disorder of skin and subcutaneous tissue   . Tobacco use disorder   . Celiac disease   .  Essential and other specified forms of tremor   . Abdominal pain, right lower quadrant   . Hypertrophy of prostate without urinary obstruction and other lower urinary tract symptoms (LUTS)   . Dysuria   . Dizziness and giddiness   . Encounter for long-term (current) use of other medications   . Iron deficiency anemia, unspecified   . Essential hypertension, benign   . Other extrapyramidal disease and abnormal movement disorder   . Pain in limb   . Other extrapyramidal disease and abnormal movement disorder   . Insomnia with sleep apnea, unspecified   . Insomnia with sleep apnea, unspecified   . Other specified disease of white blood cells   . Narcolepsy without cataplexy   . Regional enteritis of small intestine with large intestine   . Hypopotassemia   . Nausea  with vomiting   . Viral warts, unspecified   . Impotence of organic origin   . Cough   . Postinflammatory pulmonary fibrosis   . Cellulitis and abscess of unspecified site   . Dizziness and giddiness   . Dizziness and giddiness   . Other malaise and fatigue   . Diarrhea   . Bipolar I disorder, most recent episode (or current) unspecified   . Impacted cerumen   . Neuralgia, neuritis, and radiculitis, unspecified     Past Surgical History  Procedure Laterality Date  . Small intestine surgery      x 2  . Cholecystectomy  07-12-2010    Social History:   reports that he quit smoking about 17 months ago. His smoking use included Cigarettes. He started smoking about 57 years ago. He has a 49 pack-year smoking history. He has never used smokeless tobacco. He reports that he does not drink alcohol or use illicit drugs.  Family History  Problem Relation Age of Onset  . Diabetes Mother     maternal grandmother  . Pneumonia Maternal Grandmother   . Emphysema Father   . Colon cancer Neg Hx     Medications: Patient's Medications  New Prescriptions   No medications on file  Previous Medications   AMLODIPINE (NORVASC) 10 MG TABLET    Take 10 mg by mouth Nightly.     CALCIUM CARBONATE-VIT D-MIN 600-400 MG-UNIT TABS    Take 1 tablet by mouth 2 (two) times daily.     CHOLESTYRAMINE (QUESTRAN) 4 GM/DOSE POWDER    TAKE 1 SCOOPFUL DISSOLVED IN WATER ONCE DAILY.   DIPHENHYDRAMINE (BENADRYL) 25 MG CAPSULE    Take 1-2 capsules (25-50 mg total) by mouth once. Prior to infusion   DIVALPROEX (DEPAKOTE) 500 MG EC TABLET    Take 1,500 mg by mouth daily.    EPOETIN ALFA (PROCRIT) 26948 UNIT/ML INJECTION    Inject 10,000 Units into the skin every 14 (fourteen) days.    FERROUS SULFATE 325 (65 FE) MG TABLET    Take 325 mg by mouth 2 (two) times daily.     INFLIXIMAB (REMICADE) 100 MG INJECTION    Infuse Remicade IV schedule 1 49m/kg every 8 weeks Premedicate with Tylenol 500-652mby mouth and Benadryl  25-5065my mouth prior to infusion. Last PPD was on 12/2009.    LOPERAMIDE HCL (LOPERAMIDE A-D PO)    Take 1 capsule by mouth as needed.     MAGNESIUM OXIDE (MAG-OX) 400 MG TABLET    Take 400 mg by mouth 2 (two) times daily.    METOPROLOL SUCCINATE (TOPROL-XL) 25 MG 24 HR TABLET    Take 25 mg  by mouth daily.     MULTIPLE VITAMIN (MULTIVITAMIN) TABLET    Take 1 tablet by mouth daily.     OLANZAPINE (ZYPREXA) 10 MG TABLET    Take 10 mg by mouth at bedtime.     OMEPRAZOLE (PRILOSEC) 20 MG CAPSULE    Take 20 mg by mouth daily.     OXYBUTYNIN (DITROPAN XL) 15 MG 24 HR TABLET    Take 15 mg by mouth daily.     TAMSULOSIN HCL (FLOMAX) 0.4 MG CAPS    Take 0.4 mg by mouth daily.   VITAMIN E 400 UNIT CAPSULE    Take 400 Units by mouth daily.     ZOSTER VACCINE LIVE, PF, (ZOSTAVAX) 40981 UNT/0.65ML INJECTION    Inject 19,400 Units into the skin once.  Modified Medications   No medications on file  Discontinued Medications   No medications on file     Physical Exam: Filed Vitals:   09/05/13 1108  BP: 116/70  Pulse: 74  Temp: 98.1 F (36.7 C)  TempSrc: Oral  Weight: 172 lb 12.8 oz (78.382 kg)  SpO2: 96%  Physical Exam  Constitutional: He is oriented to person, place, and time.  Tall thin black male, nad  HENT:  Nasal congestion  Cardiovascular: Normal rate, regular rhythm, normal heart sounds and intact distal pulses.   Pulmonary/Chest: Effort normal and breath sounds normal. No respiratory distress.  Abdominal: Soft. Bowel sounds are normal. He exhibits no distension and no mass. There is no tenderness.  Musculoskeletal: Normal range of motion. He exhibits no edema and no tenderness.  Neurological: He is alert and oriented to person, place, and time.  Skin: Skin is warm and dry.  Psychiatric: He has a normal mood and affect.    Labs reviewed: Basic Metabolic Panel:  Recent Labs  12/03/12 1530  NA 140  K 4.0  CL 109  CO2 21  GLUCOSE 103*  BUN 17  CREATININE 1.74*  CALCIUM  8.8  MG 1.4*  PHOS 2.1*   Liver Function Tests:  Recent Labs  12/03/12 1530  ALBUMIN 3.1*  CBC:  Recent Labs  12/03/12 1530  06/30/13 0705 07/28/13 1205 08/25/13 1315  WBC 8.3  --   --   --   --   HGB 11.7*  < > 11.4* 11.8* 11.4*  HCT 35.4*  --   --   --   --   MCV 101.1*  --   --   --   --   PLT 135*  --   --   --   --   < > = values in this interval not displayed.  Assessment/Plan 1. Essential hypertension, benign -bp at goal  -recommended increased hydration in summer--admits to being lightheaded when he was having some diarrhea - Basic Metabolic Panel  2. Chronic kidney disease (CKD), stage III (moderate) - has been stable lately--recheck as he has not seen nephrology and none of the specialists have checked a bmp lately - Basic Metabolic Panel  3. Anemia in chronic kidney disease(285.21) -continues with procrit as needed for hgb <11.5  4. Regional enteritis of unspecified site -continues on remicade and cholestyramine for bulking agent (cannot go w/o this due to recurrence of diarrhea w/o it)  5. BPH (benign prostatic hyperplasia) -cont flomax, has been stable, sees urology  6. Need for prophylactic vaccination and inoculation against other viral diseases(V04.89) - zoster vaccine live, PF, (ZOSTAVAX) 19147 UNT/0.65ML injection; Inject 19,400 Units into the skin once.  Dispense: 1 each;  Refill: 0  Labs/tests ordered:   Orders Placed This Encounter  Procedures  . Basic Metabolic Panel    Next appt:  4 mos for annual exam fasting for labs

## 2013-09-22 ENCOUNTER — Encounter (HOSPITAL_COMMUNITY): Payer: Self-pay

## 2013-09-22 ENCOUNTER — Encounter (HOSPITAL_COMMUNITY)
Admission: RE | Admit: 2013-09-22 | Discharge: 2013-09-22 | Disposition: A | Discharge status: Home / Self Care | Payer: Medicare Other | Source: Ambulatory Visit | Attending: Nephrology | Admitting: Nephrology

## 2013-09-22 DIAGNOSIS — D631 Anemia in chronic kidney disease: Secondary | ICD-10-CM | POA: Diagnosis not present

## 2013-09-22 LAB — IRON AND TIBC
IRON: 83 ug/dL (ref 42–135)
Saturation Ratios: 37 % (ref 20–55)
TIBC: 226 ug/dL (ref 215–435)
UIBC: 143 ug/dL (ref 125–400)

## 2013-09-22 LAB — HEMOGLOBIN: HEMOGLOBIN: 12 g/dL — AB (ref 13.0–17.0)

## 2013-09-22 LAB — FERRITIN: Ferritin: 1068 ng/mL — ABNORMAL HIGH (ref 22–322)

## 2013-09-22 MED ORDER — CLONIDINE HCL 0.1 MG PO TABS
0.1000 mg | ORAL_TABLET | Freq: Once | ORAL | Status: DC
  Filled 2013-09-22: qty 1

## 2013-10-20 ENCOUNTER — Encounter (HOSPITAL_COMMUNITY): Payer: Self-pay

## 2013-10-20 ENCOUNTER — Encounter (HOSPITAL_COMMUNITY)
Admission: RE | Admit: 2013-10-20 | Discharge: 2013-10-20 | Disposition: A | Discharge status: Home / Self Care | Payer: Medicare Other | Source: Ambulatory Visit | Attending: Gastroenterology | Admitting: Gastroenterology

## 2013-10-20 DIAGNOSIS — D649 Anemia, unspecified: Secondary | ICD-10-CM | POA: Diagnosis present

## 2013-10-20 LAB — IRON AND TIBC
Iron: 93 ug/dL (ref 42–135)
Saturation Ratios: 44 % (ref 20–55)
TIBC: 209 ug/dL — ABNORMAL LOW (ref 215–435)
UIBC: 116 ug/dL — ABNORMAL LOW (ref 125–400)

## 2013-10-20 LAB — HEMOGLOBIN: Hemoglobin: 11.7 g/dL — ABNORMAL LOW (ref 13.0–17.0)

## 2013-10-20 LAB — FERRITIN: FERRITIN: 1120 ng/mL — AB (ref 22–322)

## 2013-10-20 NOTE — Progress Notes (Signed)
hgb 11.7 today.  Procrit not required per orders.  Pt d/c ambulatory home

## 2013-11-17 ENCOUNTER — Encounter (HOSPITAL_COMMUNITY)
Admission: RE | Admit: 2013-11-17 | Discharge: 2013-11-17 | Disposition: A | Discharge status: Home / Self Care | Payer: Medicare Other | Source: Ambulatory Visit | Attending: Gastroenterology | Admitting: Gastroenterology

## 2013-11-17 ENCOUNTER — Encounter (HOSPITAL_COMMUNITY): Payer: Self-pay

## 2013-11-17 DIAGNOSIS — D631 Anemia in chronic kidney disease: Secondary | ICD-10-CM | POA: Diagnosis present

## 2013-11-17 DIAGNOSIS — N039 Chronic nephritic syndrome with unspecified morphologic changes: Principal | ICD-10-CM

## 2013-11-17 DIAGNOSIS — N183 Chronic kidney disease, stage 3 unspecified: Secondary | ICD-10-CM | POA: Insufficient documentation

## 2013-11-17 LAB — HEMOGLOBIN: Hemoglobin: 11.2 g/dL — ABNORMAL LOW (ref 13.0–17.0)

## 2013-11-17 LAB — IRON AND TIBC
IRON: 86 ug/dL (ref 42–135)
Saturation Ratios: 45 % (ref 20–55)
TIBC: 193 ug/dL — AB (ref 215–435)
UIBC: 107 ug/dL — ABNORMAL LOW (ref 125–400)

## 2013-11-17 LAB — FERRITIN: Ferritin: 1039 ng/mL — ABNORMAL HIGH (ref 22–322)

## 2013-11-17 MED ORDER — EPOETIN ALFA 20000 UNIT/ML IJ SOLN
20000.0000 [IU] | INTRAMUSCULAR | Status: DC
  Administered 2013-11-17: 20000 [IU] via SUBCUTANEOUS
  Filled 2013-11-17: qty 1

## 2013-12-15 ENCOUNTER — Encounter (HOSPITAL_COMMUNITY): Payer: Self-pay

## 2013-12-15 ENCOUNTER — Encounter (HOSPITAL_COMMUNITY)
Admission: RE | Admit: 2013-12-15 | Discharge: 2013-12-15 | Disposition: A | Discharge status: Home / Self Care | Payer: Medicare Other | Source: Ambulatory Visit | Attending: Gastroenterology | Admitting: Gastroenterology

## 2013-12-15 DIAGNOSIS — D6489 Other specified anemias: Secondary | ICD-10-CM | POA: Diagnosis not present

## 2013-12-15 DIAGNOSIS — N183 Chronic kidney disease, stage 3 (moderate): Secondary | ICD-10-CM | POA: Insufficient documentation

## 2013-12-15 LAB — IRON AND TIBC
Iron: 90 ug/dL (ref 42–135)
SATURATION RATIOS: 42 % (ref 20–55)
TIBC: 214 ug/dL — ABNORMAL LOW (ref 215–435)
UIBC: 124 ug/dL — ABNORMAL LOW (ref 125–400)

## 2013-12-15 LAB — HEMOGLOBIN: HEMOGLOBIN: 11.9 g/dL — AB (ref 13.0–17.0)

## 2013-12-15 LAB — FERRITIN: Ferritin: 868 ng/mL — ABNORMAL HIGH (ref 22–322)

## 2013-12-15 NOTE — Discharge Instructions (Signed)
Procrit Epoetin Alfa injection What is this medicine? EPOETIN ALFA (e POE e tin AL fa) helps your body make more red blood cells. This medicine is used to treat anemia caused by chronic kidney failure, cancer chemotherapy, or HIV-therapy. It may also be used before surgery if you have anemia. This medicine may be used for other purposes; ask your health care provider or pharmacist if you have questions. COMMON BRAND NAME(S): Epogen, Procrit What should I tell my health care provider before I take this medicine? They need to know if you have any of these conditions: -blood clotting disorders -cancer patient not on chemotherapy -cystic fibrosis -heart disease, such as angina or heart failure -hemoglobin level of 12 g/dL or greater -high blood pressure -low levels of folate, iron, or vitamin B12 -seizures -an unusual or allergic reaction to erythropoietin, albumin, benzyl alcohol, hamster proteins, other medicines, foods, dyes, or preservatives -pregnant or trying to get pregnant -breast-feeding How should I use this medicine? This medicine is for injection into a vein or under the skin. It is usually given by a health care professional in a hospital or clinic setting. If you get this medicine at home, you will be taught how to prepare and give this medicine. Use exactly as directed. Take your medicine at regular intervals. Do not take your medicine more often than directed. It is important that you put your used needles and syringes in a special sharps container. Do not put them in a trash can. If you do not have a sharps container, call your pharmacist or healthcare provider to get one. Talk to your pediatrician regarding the use of this medicine in children. While this drug may be prescribed for selected conditions, precautions do apply. Overdosage: If you think you have taken too much of this medicine contact a poison control center or emergency room at once. NOTE: This medicine is only for  you. Do not share this medicine with others. What if I miss a dose? If you miss a dose, take it as soon as you can. If it is almost time for your next dose, take only that dose. Do not take double or extra doses. What may interact with this medicine? Do not take this medicine with any of the following medications: -darbepoetin alfa This list may not describe all possible interactions. Give your health care provider a list of all the medicines, herbs, non-prescription drugs, or dietary supplements you use. Also tell them if you smoke, drink alcohol, or use illegal drugs. Some items may interact with your medicine. What should I watch for while using this medicine? Visit your prescriber or health care professional for regular checks on your progress and for the needed blood tests and blood pressure measurements. It is especially important for the doctor to make sure your hemoglobin level is in the desired range, to limit the risk of potential side effects and to give you the best benefit. Keep all appointments for any recommended tests. Check your blood pressure as directed. Ask your doctor what your blood pressure should be and when you should contact him or her. As your body makes more red blood cells, you may need to take iron, folic acid, or vitamin B supplements. Ask your doctor or health care provider which products are right for you. If you have kidney disease continue dietary restrictions, even though this medication can make you feel better. Talk with your doctor or health care professional about the foods you eat and the vitamins that you take.  What side effects may I notice from receiving this medicine? Side effects that you should report to your doctor or health care professional as soon as possible: -allergic reactions like skin rash, itching or hives, swelling of the face, lips, or tongue -breathing problems -changes in vision -chest pain -confusion, trouble speaking or  understanding -feeling faint or lightheaded, falls -high blood pressure -muscle aches or pains -pain, swelling, warmth in the leg -rapid weight gain -severe headaches -sudden numbness or weakness of the face, arm or leg -trouble walking, dizziness, loss of balance or coordination -seizures (convulsions) -swelling of the ankles, feet, hands -unusually weak or tired Side effects that usually do not require medical attention (report to your doctor or health care professional if they continue or are bothersome): -diarrhea -fever, chills (flu-like symptoms) -headaches -nausea, vomiting -redness, stinging, or swelling at site where injected This list may not describe all possible side effects. Call your doctor for medical advice about side effects. You may report side effects to FDA at 1-800-FDA-1088. Where should I keep my medicine? Keep out of the reach of children. Store in a refrigerator between 2 and 8 degrees C (36 and 46 degrees F). Do not freeze or shake. Throw away any unused portion if using a single-dose vial. Multi-dose vials can be kept in the refrigerator for up to 21 days after the initial dose. Throw away unused medicine. NOTE: This sheet is a summary. It may not cover all possible information. If you have questions about this medicine, talk to your doctor, pharmacist, or health care provider.  2015, [employer]vier/Gold Standard. (2008-01-25 10:25:44)

## 2013-12-21 ENCOUNTER — Other Ambulatory Visit: Payer: Self-pay | Admitting: Nurse Practitioner

## 2013-12-28 ENCOUNTER — Other Ambulatory Visit: Payer: Self-pay

## 2013-12-28 DIAGNOSIS — D649 Anemia, unspecified: Secondary | ICD-10-CM

## 2013-12-28 DIAGNOSIS — N289 Disorder of kidney and ureter, unspecified: Secondary | ICD-10-CM

## 2013-12-28 DIAGNOSIS — I10 Essential (primary) hypertension: Secondary | ICD-10-CM

## 2014-01-03 ENCOUNTER — Other Ambulatory Visit: Payer: Medicare Other

## 2014-01-09 ENCOUNTER — Encounter: Payer: Medicare Other | Admitting: Internal Medicine

## 2014-01-09 ENCOUNTER — Telehealth: Payer: Self-pay

## 2014-01-09 NOTE — Telephone Encounter (Signed)
appt made for 01/13/14 to have TB skin test.

## 2014-01-09 NOTE — Telephone Encounter (Signed)
-----   Message from Barron Alvine, [place] sent at 01/06/2013  9:37 AM EST ----- Pt needs annual TB

## 2014-01-12 ENCOUNTER — Encounter (HOSPITAL_COMMUNITY): Payer: Self-pay

## 2014-01-12 ENCOUNTER — Encounter (HOSPITAL_COMMUNITY)
Admission: RE | Admit: 2014-01-12 | Discharge: 2014-01-12 | Disposition: A | Discharge status: Home / Self Care | Payer: Medicare Other | Source: Ambulatory Visit | Attending: Gastroenterology | Admitting: Gastroenterology

## 2014-01-12 DIAGNOSIS — N183 Chronic kidney disease, stage 3 (moderate): Secondary | ICD-10-CM | POA: Diagnosis present

## 2014-01-12 DIAGNOSIS — D6489 Other specified anemias: Secondary | ICD-10-CM | POA: Insufficient documentation

## 2014-01-12 LAB — FERRITIN: Ferritin: 939 ng/mL — ABNORMAL HIGH (ref 22–322)

## 2014-01-12 LAB — IRON AND TIBC
Iron: 98 ug/dL (ref 42–135)
Saturation Ratios: 48 % (ref 20–55)
TIBC: 204 ug/dL — ABNORMAL LOW (ref 215–435)
UIBC: 106 ug/dL — ABNORMAL LOW (ref 125–400)

## 2014-01-12 LAB — HEMOGLOBIN: Hemoglobin: 11.3 g/dL — ABNORMAL LOW (ref 13.0–17.0)

## 2014-01-12 MED ORDER — EPOETIN ALFA 10000 UNIT/ML IJ SOLN
20000.0000 [IU] | INTRAMUSCULAR | Status: DC
  Administered 2014-01-12: 20000 [IU] via SUBCUTANEOUS
  Filled 2014-01-12: qty 2

## 2014-01-13 ENCOUNTER — Ambulatory Visit (INDEPENDENT_AMBULATORY_CARE_PROVIDER_SITE_OTHER): Payer: Medicare Other | Admitting: Gastroenterology

## 2014-01-13 DIAGNOSIS — Z111 Encounter for screening for respiratory tuberculosis: Secondary | ICD-10-CM

## 2014-01-16 LAB — TB SKIN TEST
Induration: 0 mm
TB Skin Test: NEGATIVE

## 2014-02-07 ENCOUNTER — Other Ambulatory Visit: Payer: Medicare Other

## 2014-02-08 ENCOUNTER — Other Ambulatory Visit: Payer: Medicare Other

## 2014-02-08 DIAGNOSIS — I10 Essential (primary) hypertension: Secondary | ICD-10-CM

## 2014-02-08 DIAGNOSIS — N289 Disorder of kidney and ureter, unspecified: Secondary | ICD-10-CM

## 2014-02-08 DIAGNOSIS — D649 Anemia, unspecified: Secondary | ICD-10-CM

## 2014-02-09 ENCOUNTER — Encounter (HOSPITAL_COMMUNITY)
Admission: RE | Admit: 2014-02-09 | Discharge: 2014-02-09 | Disposition: A | Discharge status: Home / Self Care | Payer: Medicare Other | Source: Ambulatory Visit | Attending: Gastroenterology | Admitting: Gastroenterology

## 2014-02-09 ENCOUNTER — Ambulatory Visit (INDEPENDENT_AMBULATORY_CARE_PROVIDER_SITE_OTHER): Payer: Medicare Other | Admitting: Internal Medicine

## 2014-02-09 ENCOUNTER — Encounter: Payer: Self-pay | Admitting: Internal Medicine

## 2014-02-09 ENCOUNTER — Encounter (HOSPITAL_COMMUNITY): Payer: Self-pay

## 2014-02-09 VITALS — BP 138/78 | HR 83 | Temp 97.7°F | Resp 20 | Ht 76.0 in | Wt 180.0 lb

## 2014-02-09 DIAGNOSIS — K50111 Crohn's disease of large intestine with rectal bleeding: Secondary | ICD-10-CM

## 2014-02-09 DIAGNOSIS — I10 Essential (primary) hypertension: Secondary | ICD-10-CM

## 2014-02-09 DIAGNOSIS — N189 Chronic kidney disease, unspecified: Secondary | ICD-10-CM

## 2014-02-09 DIAGNOSIS — N183 Chronic kidney disease, stage 3 unspecified: Secondary | ICD-10-CM

## 2014-02-09 DIAGNOSIS — D631 Anemia in chronic kidney disease: Secondary | ICD-10-CM

## 2014-02-09 DIAGNOSIS — Z0000 Encounter for general adult medical examination without abnormal findings: Secondary | ICD-10-CM

## 2014-02-09 DIAGNOSIS — D6489 Other specified anemias: Secondary | ICD-10-CM | POA: Insufficient documentation

## 2014-02-09 DIAGNOSIS — Z23 Encounter for immunization: Secondary | ICD-10-CM

## 2014-02-09 DIAGNOSIS — Z13220 Encounter for screening for lipoid disorders: Secondary | ICD-10-CM

## 2014-02-09 DIAGNOSIS — N400 Enlarged prostate without lower urinary tract symptoms: Secondary | ICD-10-CM

## 2014-02-09 LAB — CBC WITH DIFFERENTIAL/PLATELET
Basophils Absolute: 0 10*3/uL (ref 0.0–0.2)
Basos: 0 %
Eos: 3 %
Eosinophils Absolute: 0.3 10*3/uL (ref 0.0–0.4)
HCT: 35.3 % — ABNORMAL LOW (ref 37.5–51.0)
Hemoglobin: 11.9 g/dL — ABNORMAL LOW (ref 12.6–17.7)
Immature Grans (Abs): 0 10*3/uL (ref 0.0–0.1)
Immature Granulocytes: 0 %
Lymphocytes Absolute: 3 10*3/uL (ref 0.7–3.1)
Lymphs: 31 %
MCH: 33.6 pg — ABNORMAL HIGH (ref 26.6–33.0)
MCHC: 33.7 g/dL (ref 31.5–35.7)
MCV: 100 fL — ABNORMAL HIGH (ref 79–97)
Monocytes Absolute: 0.8 10*3/uL (ref 0.1–0.9)
Monocytes: 8 %
Neutrophils Absolute: 5.5 10*3/uL (ref 1.4–7.0)
Neutrophils Relative %: 58 %
RBC: 3.54 x10E6/uL — ABNORMAL LOW (ref 4.14–5.80)
RDW: 13.8 % (ref 12.3–15.4)
WBC: 9.6 10*3/uL (ref 3.4–10.8)

## 2014-02-09 LAB — COMPREHENSIVE METABOLIC PANEL
ALT: 19 IU/L (ref 0–44)
AST: 19 IU/L (ref 0–40)
Albumin/Globulin Ratio: 1.3 (ref 1.1–2.5)
Albumin: 3.6 g/dL (ref 3.6–4.8)
Alkaline Phosphatase: 76 IU/L (ref 39–117)
BUN/Creatinine Ratio: 10 (ref 10–22)
BUN: 18 mg/dL (ref 8–27)
CO2: 24 mmol/L (ref 18–29)
Calcium: 8.9 mg/dL (ref 8.6–10.2)
Chloride: 107 mmol/L (ref 97–108)
Creatinine, Ser: 1.83 mg/dL — ABNORMAL HIGH (ref 0.76–1.27)
GFR calc Af Amer: 43 mL/min/{1.73_m2} — ABNORMAL LOW (ref >59)
GFR calc non Af Amer: 37 mL/min/{1.73_m2} — ABNORMAL LOW (ref >59)
Globulin, Total: 2.8 g/dL (ref 1.5–4.5)
Glucose: 106 mg/dL — ABNORMAL HIGH (ref 65–99)
Potassium: 4.1 mmol/L (ref 3.5–5.2)
Sodium: 144 mmol/L (ref 134–144)
Total Bilirubin: 0.4 mg/dL (ref 0.0–1.2)
Total Protein: 6.4 g/dL (ref 6.0–8.5)

## 2014-02-09 LAB — LIPID PANEL
Chol/HDL Ratio: 3.5 ratio units (ref 0.0–5.0)
Cholesterol, Total: 139 mg/dL (ref 100–199)
HDL: 40 mg/dL (ref >39)
LDL Calculated: 74 mg/dL (ref 0–99)
Triglycerides: 127 mg/dL (ref 0–149)
VLDL Cholesterol Cal: 25 mg/dL (ref 5–40)

## 2014-02-09 LAB — IRON AND TIBC
IRON: 90 ug/dL (ref 42–135)
SATURATION RATIOS: 41 % (ref 20–55)
TIBC: 217 ug/dL (ref 215–435)
UIBC: 127 ug/dL (ref 125–400)

## 2014-02-09 LAB — TSH: TSH: 1.48 u[IU]/mL (ref 0.450–4.500)

## 2014-02-09 LAB — HEMOGLOBIN: Hemoglobin: 12.3 g/dL — ABNORMAL LOW (ref 13.0–17.0)

## 2014-02-09 LAB — FERRITIN: Ferritin: 1021 ng/mL — ABNORMAL HIGH (ref 22–322)

## 2014-02-09 NOTE — Progress Notes (Signed)
hgb 12.3  Today . Procrit not required per order

## 2014-02-09 NOTE — Progress Notes (Signed)
Patient ID: Brent [name], male   DOB: 1945/05/11, 68 y.o.   MRN: [id number]   Location:  [facility]  Code Status: has not yet completed his advance directive, remains full code   Allergies  Allergen Reactions  . Azathioprine     REACTION: affected WBC  . Ciprofloxacin     Pt denies any reaction  . Levaquin [Levofloxacin In D5w]     Pt denies any reaction  . Plendil [Felodipine]     Pt denies any reaction    Chief Complaint  Patient presents with  . Annual Exam    HPI: Patient is a 68 y.o. male seen in the office today for his annual exam.  He is doing quite well.  Had his cscope 04/20/13 Dr. Arvella Merles done at [facility] with one polyp removed, ulcers in distal ileum and neoterminal ileum from Crohn's, side to side ileocolonic anastomosis in colon, mild diverticulosis..he has not heard the results of the pathology for the polyp.  Doing great.  Dr. Dayna Ramus prescribed medications for frequent difficulty with bladder control.  Had terrible urgency--oxybutynin was prescribed...then had frequency and started on new pill.  Myrbetriq.    Mood is good.    No changes with procrit and iron.  Doesn't have to get procrit as often lately.    No pain.  No falling.  Sleeping well.  Eating well--gained a little weight.  Remains off cigarettes.     Review of Systems:  Review of Systems  Constitutional: Negative for fever, chills and weight loss.  HENT: Negative for congestion.   Eyes: Negative for blurred vision.  Respiratory: Negative for shortness of breath.   Cardiovascular: Negative for chest pain and leg swelling.  Gastrointestinal: Negative for abdominal pain, diarrhea, constipation, blood in stool and melena.  Genitourinary: Negative for dysuria.  Musculoskeletal: Negative for myalgias and falls.  Skin: Negative for rash.  Neurological: Negative for dizziness and loss of consciousness.  Endo/Heme/Allergies: Does not bruise/bleed easily.    Psychiatric/Behavioral: Negative for depression and memory loss.    Past Medical History  Diagnosis Date  . Crohn's   . Anxiety   . Depression   . Renal insufficiency   . Anemia   . Edema   . Other abnormal blood chemistry   . Malnutrition of mild degree   . Acute cholecystitis   . Abdominal pain, unspecified site   . Viremia, unspecified   . Pain in joint, lower leg   . Loss of weight   . Regional enteritis of small intestine   . Benign paroxysmal positional vertigo   . Other B-complex deficiencies   . Hypertensive renal disease, benign   . Other B-complex deficiencies   . Anemia, unspecified   . Other specified disease of white blood cells   . Chronic kidney disease, stage III (moderate)   . Unspecified disorder of skin and subcutaneous tissue   . Tobacco use disorder   . Celiac disease   . Essential and other specified forms of tremor   . Abdominal pain, right lower quadrant   . Hypertrophy of prostate without urinary obstruction and other lower urinary tract symptoms (LUTS)   . Dysuria   . Dizziness and giddiness   . Encounter for long-term (current) use of other medications   . Iron deficiency anemia, unspecified   . Essential hypertension, benign   . Other extrapyramidal disease and abnormal movement disorder   . Pain in limb   . Other  extrapyramidal disease and abnormal movement disorder   . Insomnia with sleep apnea, unspecified   . Insomnia with sleep apnea, unspecified   . Other specified disease of white blood cells   . Narcolepsy without cataplexy   . Regional enteritis of small intestine with large intestine   . Hypopotassemia   . Nausea with vomiting   . Viral warts, unspecified   . Impotence of organic origin   . Cough   . Postinflammatory pulmonary fibrosis   . Cellulitis and abscess of unspecified site   . Dizziness and giddiness   . Dizziness and giddiness   . Other malaise and fatigue   . Diarrhea   . Bipolar I disorder, most recent episode  (or current) unspecified   . Impacted cerumen   . Neuralgia, neuritis, and radiculitis, unspecified     Past Surgical History  Procedure Laterality Date  . Small intestine surgery      x 2  . Cholecystectomy  07-12-2010    Social History:   reports that he quit smoking about 22 months ago. His smoking use included Cigarettes. He started smoking about 57 years ago. He has a 49 pack-year smoking history. He has never used smokeless tobacco. He reports that he does not drink alcohol or use illicit drugs.  Family History  Problem Relation Age of Onset  . Diabetes Mother     maternal grandmother  . Pneumonia Maternal Grandmother   . Emphysema Father   . Colon cancer Neg Hx     Medications: Patient's Medications  New Prescriptions   No medications on file  Previous Medications   AMLODIPINE (NORVASC) 10 MG TABLET    Take 10 mg by mouth Nightly.     CALCIUM CARBONATE-VIT D-MIN 600-400 MG-UNIT TABS    Take 1 tablet by mouth 2 (two) times daily.     CHOLESTYRAMINE (QUESTRAN) 4 GM/DOSE POWDER    TAKE 1 SCOOPFUL DISSOLVED IN WATER ONCE DAILY.   DIPHENHYDRAMINE (BENADRYL) 25 MG CAPSULE    Take 1-2 capsules (25-50 mg total) by mouth once. Prior to infusion   DIVALPROEX (DEPAKOTE) 500 MG EC TABLET    Take 1,500 mg by mouth daily.    EPOETIN ALFA (PROCRIT) 76195 UNIT/ML INJECTION    Inject 10,000 Units into the skin every 14 (fourteen) days.    FERROUS SULFATE 325 (65 FE) MG TABLET    Take 325 mg by mouth 2 (two) times daily.     INFLIXIMAB (REMICADE) 100 MG INJECTION    Infuse Remicade IV schedule 1 36m/kg every 8 weeks Premedicate with Tylenol 500-6581mby mouth and Benadryl 25-5037my mouth prior to infusion. Last PPD was on 12/2009.    LOPERAMIDE HCL (LOPERAMIDE A-D PO)    Take 1 capsule by mouth as needed.     MAGNESIUM OXIDE (MAG-OX) 400 MG TABLET    Take 400 mg by mouth 2 (two) times daily.    METOPROLOL SUCCINATE (TOPROL-XL) 25 MG 24 HR TABLET    Take 25 mg by mouth daily.      MULTIPLE VITAMIN (MULTIVITAMIN) TABLET    Take 1 tablet by mouth daily.     MYRBETRIQ 50 MG TB24 TABLET    Take 50 mg by mouth daily.   OLANZAPINE (ZYPREXA) 10 MG TABLET    Take 10 mg by mouth at bedtime.     OMEPRAZOLE (PRILOSEC) 20 MG CAPSULE    Take 20 mg by mouth daily.     OXYBUTYNIN (DITROPAN XL) 15 MG 24 HR TABLET  Take 15 mg by mouth daily.     TAMSULOSIN HCL (FLOMAX) 0.4 MG CAPS    Take 0.4 mg by mouth daily.   VITAMIN E 400 UNIT CAPSULE    Take 400 Units by mouth daily.     ZOSTER VACCINE LIVE, PF, (ZOSTAVAX) 37902 UNT/0.65ML INJECTION    Inject 19,400 Units into the skin once.  Modified Medications   No medications on file  Discontinued Medications   No medications on file   Physical Exam: Filed Vitals:   02/09/14 1517  BP: 138/78  Pulse: 83  Temp: 97.7 F (36.5 C)  TempSrc: Oral  Resp: 20  Height: 6' 4"  (1.93 m)  Weight: 180 lb (81.647 kg)  SpO2: 97%  Physical Exam  Constitutional: He is oriented to person, place, and time. He appears well-developed and well-nourished. No distress.  Tall thin black man  HENT:  Head: Normocephalic and atraumatic.  Right Ear: External ear normal.  Left Ear: External ear normal.  Nose: Nose normal.  Mouth/Throat: No oropharyngeal exudate.  Eyes: Conjunctivae and EOM are normal. Pupils are equal, round, and reactive to light.  Glasses with bifocals  Neck: Normal range of motion. Neck supple. No JVD present.  Cardiovascular: Normal rate, regular rhythm, normal heart sounds and intact distal pulses.   Pulmonary/Chest: Effort normal and breath sounds normal. No respiratory distress.  Abdominal: Soft. Bowel sounds are normal. He exhibits no distension and no mass. There is no tenderness.  Scar in lower abdomen from prior surgery  Musculoskeletal: Normal range of motion. He exhibits no edema or tenderness.  Neurological: He is alert and oriented to person, place, and time. He has normal reflexes. No cranial nerve deficit.  Skin: Skin  is warm and dry.  Psychiatric: He has a normal mood and affect.     Labs reviewed: Basic Metabolic Panel:  Recent Labs  02/08/14 0843  NA 144  K 4.1  CL 107  CO2 24  GLUCOSE 106*  BUN 18  CREATININE 1.83*  CALCIUM 8.9  TSH 1.480   Liver Function Tests:  Recent Labs  02/08/14 0843  AST 19  ALT 19  ALKPHOS 76  BILITOT 0.4  PROT 6.4   No results for input(s): LIPASE, AMYLASE in the last 8760 hours. No results for input(s): AMMONIA in the last 8760 hours. CBC:  Recent Labs  01/12/14 1203 02/08/14 0843 02/09/14 1105  WBC  --  9.6  --   NEUTROABS  --  5.5  --   HGB 11.3* 11.9* 12.3*  HCT  --  35.3*  --   MCV  --  100*  --    Lipid Panel:  Recent Labs  02/08/14 0843  HDL 40  LDLCALC 74  TRIG 127  CHOLHDL 3.5   Cscope reviewed and will be scanned as above  Assessment/Plan 1. Routine general medical examination at a health care facility -health maintenance all reviewed with pt -not depressed -memory normal -no falls -has had his pneumococcal 23 vaccine in the past 2006 -got flu shot at [place] -had cscope in 2/15 -bp at goal -advised to get zostavax at the [place] -tdap up to date as of 05/26/11  2. Need for vaccination with 13-polyvalent pneumococcal conjugate vaccine -prevnar will be given at a separate appt--we were out today  3. Crohn's colitis, with rectal bleeding -most recent cscope was brought today and reviewed -had one polyp with unknown pathology -continues on remicade--tells me he was "one of the [place] pigs for testing" -has helped him  tremendously to avoid flares -continues with questran and loperamide  4. Chronic kidney disease (CKD), stage III (moderate) - cont to avoid nsaids, hydrate well and follows with renal--gets prn epo and ferrous sulfate - Comprehensive metabolic panel; Future  5. Essential hypertension, benign -bp is at goal with toprol  6. BPH (benign prostatic hyperplasia) -cont flomax,   7. Anemia in chronic kidney  disease -cont iron and cont epo prn  8. Screening, lipid - has not required medication for cholesterol in past - Lipid panel; Future  Labs/tests ordered:   Orders Placed This Encounter  Procedures  . Comprehensive metabolic panel    Standing Status: Future     Number of Occurrences:      Standing Expiration Date: 02/10/2015    Order Specific Question:  Has the patient fasted?    Answer:  Yes  . Lipid panel    Standing Status: Future     Number of Occurrences:      Standing Expiration Date: 02/10/2015    Order Specific Question:  Has the patient fasted?    Answer:  Yes    Next appt:  6 mos with labs before and to make a prevnar appt also  Kandra Graven L. Fortune Torosian, D.O. [facility] [address], [place] [address] Cell Phone (Mon-Fri 8am-5pm):  [phone] On Call:  [phone] & follow prompts after 5pm & weekends Office Phone:  [phone] Office Fax:  [phone]

## 2014-02-10 ENCOUNTER — Encounter: Payer: Self-pay | Admitting: Internal Medicine

## 2014-03-08 ENCOUNTER — Encounter: Payer: Self-pay | Admitting: Internal Medicine

## 2014-03-08 ENCOUNTER — Ambulatory Visit (INDEPENDENT_AMBULATORY_CARE_PROVIDER_SITE_OTHER): Payer: Medicare HMO | Admitting: Internal Medicine

## 2014-03-08 VITALS — BP 130/82 | HR 72 | Temp 98.2°F | Resp 10 | Ht 76.0 in | Wt 175.8 lb

## 2014-03-08 DIAGNOSIS — J209 Acute bronchitis, unspecified: Secondary | ICD-10-CM

## 2014-03-08 DIAGNOSIS — K50111 Crohn's disease of large intestine with rectal bleeding: Secondary | ICD-10-CM

## 2014-03-08 MED ORDER — ALBUTEROL SULFATE HFA 108 (90 BASE) MCG/ACT IN AERS: INHALATION_SPRAY | RESPIRATORY_TRACT | Status: DC

## 2014-03-08 MED ORDER — PREDNISONE 10 MG PO TABS: ORAL_TABLET | ORAL | Status: DC

## 2014-03-08 MED ORDER — HYDROCOD POLST-CHLORPHEN POLST 10-8 MG/5ML PO LQCR: 5.0000 mL | Freq: Two times a day (BID) | ORAL | Status: DC | PRN

## 2014-03-08 MED ORDER — AZITHROMYCIN 250 MG PO TABS: ORAL_TABLET | ORAL | Status: DC

## 2014-03-08 NOTE — Progress Notes (Signed)
Patient ID: Brent [name], male   DOB: 13-Jan-1946, 69 y.o.   MRN: [id number]    Facility  [facility]    Place of Service:   OFFICE   Allergies  Allergen Reactions  . Azathioprine     REACTION: affected WBC  . Ciprofloxacin     Pt denies any reaction  . Levaquin [Levofloxacin In D5w]     Pt denies any reaction  . Plendil [Felodipine]     Pt denies any reaction    Chief Complaint  Patient presents with  . Acute Visit    Cough x 2 weeks    HPI:  69 yo male seen today for c/o 2 week hx cough that has progressively changed from dry to wet. Non productive.  No hx lung d/o but has hx Crohn's disease. He was seen by [facility] 2 weeks ago for dehydration and rec'd IVF. CXR negative.  No abx Rx at that time. No f but has chills. He started sneezing this AM. Denies SOB, nausea, change in appetite, sleep disturbance, post nasal drip, sore throat, HA, dizziness, sinus pressure/pain, ear pressure/pain, nasal congestion. He does have "deep" CP. Tried OTC mucinex without relief. He is a past smoker  Medications: Patient's Medications  New Prescriptions   No medications on file  Previous Medications   AMLODIPINE (NORVASC) 10 MG TABLET    Take 10 mg by mouth Nightly.     CALCIUM CARBONATE-VIT D-MIN 600-400 MG-UNIT TABS    Take 1 tablet by mouth 2 (two) times daily.     CHOLESTYRAMINE (QUESTRAN) 4 GM/DOSE POWDER    TAKE 1 SCOOPFUL DISSOLVED IN WATER ONCE DAILY.   DIPHENHYDRAMINE (BENADRYL) 25 MG CAPSULE    Take 1-2 capsules (25-50 mg total) by mouth once. Prior to infusion   DIVALPROEX (DEPAKOTE) 500 MG EC TABLET    Take 1,500 mg by mouth daily.    EPOETIN ALFA (PROCRIT) 71696 UNIT/ML INJECTION    Inject 10,000 Units into the skin every 14 (fourteen) days.    FERROUS SULFATE 325 (65 FE) MG TABLET    Take 325 mg by mouth 2 (two) times daily.     INFLIXIMAB (REMICADE) 100 MG INJECTION    Infuse Remicade IV schedule 1 66m/kg every 8 weeks Premedicate with Tylenol 500-6546mby mouth and Benadryl 25-5021my  mouth prior to infusion. Last PPD was on 12/2009.    LOPERAMIDE HCL (LOPERAMIDE A-D PO)    Take 1 capsule by mouth as needed.     MAGNESIUM OXIDE (MAG-OX) 400 MG TABLET    Take 400 mg by mouth 2 (two) times daily.    METOPROLOL SUCCINATE (TOPROL-XL) 25 MG 24 HR TABLET    Take 25 mg by mouth daily.     MULTIPLE VITAMIN (MULTIVITAMIN) TABLET    Take 1 tablet by mouth daily.     MYRBETRIQ 50 MG TB24 TABLET    Take 50 mg by mouth daily.   OLANZAPINE (ZYPREXA) 10 MG TABLET    Take 10 mg by mouth at bedtime.     OMEPRAZOLE (PRILOSEC) 20 MG CAPSULE    Take 20 mg by mouth daily.     OXYBUTYNIN (DITROPAN XL) 15 MG 24 HR TABLET    Take 15 mg by mouth daily.     TAMSULOSIN HCL (FLOMAX) 0.4 MG CAPS    Take 0.4 mg by mouth daily.   VITAMIN E 400 UNIT CAPSULE    Take 400 Units by mouth daily.     ZOSTER VACCINE LIVE,  PF, (ZOSTAVAX) 40973 UNT/0.65ML INJECTION    Inject 19,400 Units into the skin once.  Modified Medications   No medications on file  Discontinued Medications   No medications on file     Review of Systems  As above. All other systems reviewed are negative  Filed Vitals:   03/08/14 0834  BP: 130/82  Pulse: 72  Temp: 98.2 F (36.8 C)  TempSrc: Oral  Resp: 10  Height: 6' 4"  (1.93 m)  Weight: 175 lb 12.8 oz (79.742 kg)  SpO2: 93%   Body mass index is 21.41 kg/(m^2).  Physical Exam  CONSTITUTIONAL: Looks ill in NAD. Awake, alert and oriented x 3 HEENT: PERRLA. No sinus TTP. Oropharynx clear and without exudate NECK: Supple. Nontender. No palpable cervical or supraclavicular lymph nodes.  CVS: Regular rate without murmur, gallop or rub. LUNGS: (+) end expiratory wheezing with reduced BS R>L base. Prolonged expiratory phase. No rales or rhonchi. EXTREMITIES: No edema b/l.   Labs reviewed: Hospital Outpatient Visit on 02/09/2014  Component Date Value Ref Range Status  . Iron 02/09/2014 90  42 - 135 ug/dL Final  . TIBC 02/09/2014 217  215 - 435 ug/dL Final  . Saturation  Ratios 02/09/2014 41  20 - 55 % Final  . UIBC 02/09/2014 127  125 - 400 ug/dL Final   Performed at [facility]  . Ferritin 02/09/2014 1021* 22 - 322 ng/mL Final   Performed at [facility]  . Hemoglobin 02/09/2014 12.3* 13.0 - 17.0 g/dL Final  Appointment on 02/08/2014  Component Date Value Ref Range Status  . Cholesterol, Total 02/08/2014 139  100 - 199 mg/dL Final  . Triglycerides 02/08/2014 127  0 - 149 mg/dL Final  . HDL 02/08/2014 40  >39 mg/dL Final   Comment: According to ATP-III Guidelines, HDL-C >59 mg/dL is considered a negative risk factor for CHD.   Marland Kitchen VLDL Cholesterol Cal 02/08/2014 25  5 - 40 mg/dL Final  . LDL Calculated 02/08/2014 74  0 - 99 mg/dL Final  . Chol/HDL Ratio 02/08/2014 3.5  0.0 - 5.0 ratio units Final   Comment:                                   T. Chol/HDL Ratio                                             Men  Women                               1/2 Avg.Risk  3.4    3.3                                   Avg.Risk  5.0    4.4                                2X Avg.Risk  9.6    7.1                                3X Avg.Risk 23.4  11.0   . Glucose 02/08/2014 106* 65 - 99 mg/dL Final  . BUN 02/08/2014 18  8 - 27 mg/dL Final  . Creatinine, Ser 02/08/2014 1.83* 0.76 - 1.27 mg/dL Final  . GFR calc non Af Amer 02/08/2014 37* >59 mL/min/1.73 Final  . GFR calc Af Amer 02/08/2014 43* >59 mL/min/1.73 Final  . BUN/Creatinine Ratio 02/08/2014 10  10 - 22 Final  . Sodium 02/08/2014 144  134 - 144 mmol/L Final  . Potassium 02/08/2014 4.1  3.5 - 5.2 mmol/L Final  . Chloride 02/08/2014 107  97 - 108 mmol/L Final  . CO2 02/08/2014 24  18 - 29 mmol/L Final  . Calcium 02/08/2014 8.9  8.6 - 10.2 mg/dL Final  . Total Protein 02/08/2014 6.4  6.0 - 8.5 g/dL Final  . Albumin 02/08/2014 3.6  3.6 - 4.8 g/dL Final  . Globulin, Total 02/08/2014 2.8  1.5 - 4.5 g/dL Final  . Albumin/Globulin Ratio 02/08/2014 1.3  1.1 - 2.5 Final  . Total Bilirubin 02/08/2014 0.4   0.0 - 1.2 mg/dL Final  . Alkaline Phosphatase 02/08/2014 76  39 - 117 IU/L Final  . AST 02/08/2014 19  0 - 40 IU/L Final  . ALT 02/08/2014 19  0 - 44 IU/L Final  . TSH 02/08/2014 1.480  0.450 - 4.500 uIU/mL Final  . WBC 02/08/2014 9.6  3.4 - 10.8 x10E3/uL Final  . RBC 02/08/2014 3.54* 4.14 - 5.80 x10E6/uL Final  . Hemoglobin 02/08/2014 11.9* 12.6 - 17.7 g/dL Final  . HCT 02/08/2014 35.3* 37.5 - 51.0 % Final  . MCV 02/08/2014 100* 79 - 97 fL Final  . MCH 02/08/2014 33.6* 26.6 - 33.0 pg Final  . MCHC 02/08/2014 33.7  31.5 - 35.7 g/dL Final  . RDW 02/08/2014 13.8  12.3 - 15.4 % Final  . Neutrophils Relative % 02/08/2014 58   Final  . Lymphs 02/08/2014 31   Final  . Monocytes 02/08/2014 8   Final  . Eos 02/08/2014 3   Final  . Basos 02/08/2014 0   Final  . Neutrophils Absolute 02/08/2014 5.5  1.4 - 7.0 x10E3/uL Final  . Lymphocytes Absolute 02/08/2014 3.0  0.7 - 3.1 x10E3/uL Final  . Monocytes Absolute 02/08/2014 0.8  0.1 - 0.9 x10E3/uL Final  . Eosinophils Absolute 02/08/2014 0.3  0.0 - 0.4 x10E3/uL Final  . Basophils Absolute 02/08/2014 0.0  0.0 - 0.2 x10E3/uL Final  . Immature Granulocytes 02/08/2014 0   Final  . Immature Grans (Abs) 02/08/2014 0.0  0.0 - 0.1 x10E3/uL Final  Clinical Support on 01/13/2014  Component Date Value Ref Range Status  . TB Skin Test 01/16/2014 Negative   Final  . Induration 01/16/2014 0   Final  Hospital Outpatient Visit on 01/12/2014  Component Date Value Ref Range Status  . Iron 01/12/2014 98  42 - 135 ug/dL Final  . TIBC 01/12/2014 204* 215 - 435 ug/dL Final  . Saturation Ratios 01/12/2014 48  20 - 55 % Final  . UIBC 01/12/2014 106* 125 - 400 ug/dL Final   Performed at [facility]  . Ferritin 01/12/2014 939* 22 - 322 ng/mL Final   Performed at [facility]  . Hemoglobin 01/12/2014 11.3* 13.0 - 17.0 g/dL Final  Hospital Outpatient Visit on 12/15/2013  Component Date Value Ref Range Status  . Iron 12/15/2013 90  42 - 135 ug/dL Final   . TIBC 12/15/2013 214* 215 - 435 ug/dL Final  . Saturation Ratios 12/15/2013 42  20 - 55 %  Final  . UIBC 12/15/2013 124* 125 - 400 ug/dL Final   Performed at [facility]  . Ferritin 12/15/2013 868* 22 - 322 ng/mL Final   Performed at [facility]  . Hemoglobin 12/15/2013 11.9* 13.0 - 17.0 g/dL Final     Assessment/Plan    ICD-9-CM ICD-10-CM   1. Acute bronchitis, unspecified organism 466.0 J20.9 chlorpheniramine-HYDROcodone (TUSSIONEX PENNKINETIC ER) 10-8 MG/5ML LQCR  2. Crohn's colitis, with rectal bleeding 555.1 K50.111    - due to immunosuppressive tx, will treat infection. Rx Zpak as directed, prednisone taper and HFA. Duoneb given today with improvement in inspiratory effort and reduced wheezing.  - push fluids and rest  RTO if no better or sx's worsen  Druscilla Petsch S. Perlie Gold  [facility] [address] [place], [place] [address] [phone] Office (Wednesdays and Fridays 8 AM - 5 PM) [phone] Cell (Monday-Friday 8 AM - 5 PM)

## 2014-03-08 NOTE — Patient Instructions (Signed)

## 2014-03-09 ENCOUNTER — Other Ambulatory Visit (HOSPITAL_COMMUNITY): Payer: Self-pay | Admitting: Nephrology

## 2014-03-09 ENCOUNTER — Encounter (HOSPITAL_COMMUNITY)
Admission: RE | Admit: 2014-03-09 | Discharge: 2014-03-09 | Disposition: A | Discharge status: Home / Self Care | Payer: Medicare HMO | Source: Ambulatory Visit | Attending: Gastroenterology | Admitting: Gastroenterology

## 2014-03-09 ENCOUNTER — Encounter (HOSPITAL_COMMUNITY): Payer: Self-pay

## 2014-03-09 DIAGNOSIS — N183 Chronic kidney disease, stage 3 (moderate): Secondary | ICD-10-CM | POA: Insufficient documentation

## 2014-03-09 DIAGNOSIS — D6489 Other specified anemias: Secondary | ICD-10-CM | POA: Insufficient documentation

## 2014-03-09 LAB — FERRITIN: Ferritin: 1193 ng/mL — ABNORMAL HIGH (ref 22–322)

## 2014-03-09 LAB — IRON AND TIBC
Iron: 95 ug/dL (ref 42–165)
Saturation Ratios: 53 % (ref 20–55)
TIBC: 178 ug/dL — ABNORMAL LOW (ref 215–435)
UIBC: 83 ug/dL — ABNORMAL LOW (ref 125–400)

## 2014-03-09 LAB — HEMOGLOBIN: HEMOGLOBIN: 10.4 g/dL — AB (ref 13.0–17.0)

## 2014-03-09 MED ORDER — EPOETIN ALFA 10000 UNIT/ML IJ SOLN
10000.0000 [IU] | INTRAMUSCULAR | Status: DC
  Administered 2014-03-09: 10000 [IU] via SUBCUTANEOUS
  Filled 2014-03-09: qty 1

## 2014-03-09 NOTE — Discharge Instructions (Signed)
Epoetin Alfa injection What is this medicine? EPOETIN ALFA (e POE e tin AL fa) helps your body make more red blood cells. This medicine is used to treat anemia caused by chronic kidney failure, cancer chemotherapy, or HIV-therapy. It may also be used before surgery if you have anemia. This medicine may be used for other purposes; ask your health care provider or pharmacist if you have questions. COMMON BRAND NAME(S): Epogen, Procrit What should I tell my health care provider before I take this medicine? They need to know if you have any of these conditions: -blood clotting disorders -cancer patient not on chemotherapy -cystic fibrosis -heart disease, such as angina or heart failure -hemoglobin level of 12 g/dL or greater -high blood pressure -low levels of folate, iron, or vitamin B12 -seizures -an unusual or allergic reaction to erythropoietin, albumin, benzyl alcohol, hamster proteins, other medicines, foods, dyes, or preservatives -pregnant or trying to get pregnant -breast-feeding How should I use this medicine? This medicine is for injection into a vein or under the skin. It is usually given by a health care professional in a hospital or clinic setting. If you get this medicine at home, you will be taught how to prepare and give this medicine. Use exactly as directed. Take your medicine at regular intervals. Do not take your medicine more often than directed. It is important that you put your used needles and syringes in a special sharps container. Do not put them in a trash can. If you do not have a sharps container, call your pharmacist or healthcare provider to get one. Talk to your pediatrician regarding the use of this medicine in children. While this drug may be prescribed for selected conditions, precautions do apply. Overdosage: If you think you have taken too much of this medicine contact a poison control center or emergency room at once. NOTE: This medicine is only for you. Do  not share this medicine with others. What if I miss a dose? If you miss a dose, take it as soon as you can. If it is almost time for your next dose, take only that dose. Do not take double or extra doses. What may interact with this medicine? Do not take this medicine with any of the following medications: -darbepoetin alfa This list may not describe all possible interactions. Give your health care provider a list of all the medicines, herbs, non-prescription drugs, or dietary supplements you use. Also tell them if you smoke, drink alcohol, or use illegal drugs. Some items may interact with your medicine. What should I watch for while using this medicine? Visit your prescriber or health care professional for regular checks on your progress and for the needed blood tests and blood pressure measurements. It is especially important for the doctor to make sure your hemoglobin level is in the desired range, to limit the risk of potential side effects and to give you the best benefit. Keep all appointments for any recommended tests. Check your blood pressure as directed. Ask your doctor what your blood pressure should be and when you should contact him or her. As your body makes more red blood cells, you may need to take iron, folic acid, or vitamin B supplements. Ask your doctor or health care provider which products are right for you. If you have kidney disease continue dietary restrictions, even though this medication can make you feel better. Talk with your doctor or health care professional about the foods you eat and the vitamins that you take. What  side effects may I notice from receiving this medicine? Side effects that you should report to your doctor or health care professional as soon as possible: -allergic reactions like skin rash, itching or hives, swelling of the face, lips, or tongue -breathing problems -changes in vision -chest pain -confusion, trouble speaking or understanding -feeling  faint or lightheaded, falls -high blood pressure -muscle aches or pains -pain, swelling, warmth in the leg -rapid weight gain -severe headaches -sudden numbness or weakness of the face, arm or leg -trouble walking, dizziness, loss of balance or coordination -seizures (convulsions) -swelling of the ankles, feet, hands -unusually weak or tired Side effects that usually do not require medical attention (report to your doctor or health care professional if they continue or are bothersome): -diarrhea -fever, chills (flu-like symptoms) -headaches -nausea, vomiting -redness, stinging, or swelling at site where injected This list may not describe all possible side effects. Call your doctor for medical advice about side effects. You may report side effects to FDA at 1-800-FDA-1088. Where should I keep my medicine? Keep out of the reach of children. Store in a refrigerator between 2 and 8 degrees C (36 and 46 degrees F). Do not freeze or shake. Throw away any unused portion if using a single-dose vial. Multi-dose vials can be kept in the refrigerator for up to 21 days after the initial dose. Throw away unused medicine. NOTE: This sheet is a summary. It may not cover all possible information. If you have questions about this medicine, talk to your doctor, pharmacist, or health care provider.  2015, Elsevier/Gold Standard. (2008-01-25 10:25:44)

## 2014-03-10 LAB — VITAMIN D 25 HYDROXY (VIT D DEFICIENCY, FRACTURES): Vit D, 25-Hydroxy: 59 ng/mL (ref 30–100)

## 2014-03-10 LAB — PARATHYROID HORMONE, INTACT (NO CA): PTH: 28 pg/mL (ref 15–65)

## 2014-03-28 ENCOUNTER — Ambulatory Visit: Payer: Medicare Other | Admitting: Gastroenterology

## 2014-04-06 ENCOUNTER — Encounter (HOSPITAL_COMMUNITY): Payer: Self-pay

## 2014-04-06 ENCOUNTER — Encounter (HOSPITAL_COMMUNITY)
Admission: RE | Admit: 2014-04-06 | Discharge: 2014-04-06 | Disposition: A | Discharge status: Home / Self Care | Payer: Medicare HMO | Source: Ambulatory Visit | Attending: Gastroenterology | Admitting: Gastroenterology

## 2014-04-06 DIAGNOSIS — N183 Chronic kidney disease, stage 3 (moderate): Secondary | ICD-10-CM | POA: Insufficient documentation

## 2014-04-06 DIAGNOSIS — D6489 Other specified anemias: Secondary | ICD-10-CM | POA: Insufficient documentation

## 2014-04-06 LAB — HEMOGLOBIN: Hemoglobin: 11.3 g/dL — ABNORMAL LOW (ref 13.0–17.0)

## 2014-04-06 MED ORDER — EPOETIN ALFA 10000 UNIT/ML IJ SOLN
10000.0000 [IU] | INTRAMUSCULAR | Status: DC
  Administered 2014-04-06: 10000 [IU] via SUBCUTANEOUS
  Filled 2014-04-06: qty 1

## 2014-04-07 LAB — FERRITIN: Ferritin: 1051 ng/mL — ABNORMAL HIGH (ref 22–322)

## 2014-04-07 LAB — IRON AND TIBC
Iron: 34 ug/dL — ABNORMAL LOW (ref 42–165)
Saturation Ratios: 18 % — ABNORMAL LOW (ref 20–55)
TIBC: 189 ug/dL — ABNORMAL LOW (ref 215–435)
UIBC: 155 ug/dL (ref 125–400)

## 2014-05-04 ENCOUNTER — Encounter (HOSPITAL_COMMUNITY)
Admission: RE | Admit: 2014-05-04 | Discharge: 2014-05-04 | Disposition: A | Discharge status: Home / Self Care | Payer: Medicare HMO | Source: Ambulatory Visit | Attending: Gastroenterology | Admitting: Gastroenterology

## 2014-05-04 ENCOUNTER — Encounter (HOSPITAL_COMMUNITY): Payer: Self-pay

## 2014-05-04 DIAGNOSIS — N183 Chronic kidney disease, stage 3 (moderate): Secondary | ICD-10-CM | POA: Diagnosis not present

## 2014-05-04 DIAGNOSIS — D6489 Other specified anemias: Secondary | ICD-10-CM | POA: Diagnosis not present

## 2014-05-04 LAB — HEMOGLOBIN: Hemoglobin: 11.4 g/dL — ABNORMAL LOW (ref 13.0–17.0)

## 2014-05-04 LAB — IRON AND TIBC
Iron: 67 ug/dL (ref 42–165)
SATURATION RATIOS: 33 % (ref 20–55)
TIBC: 202 ug/dL — AB (ref 215–435)
UIBC: 135 ug/dL (ref 125–400)

## 2014-05-04 LAB — FERRITIN: FERRITIN: 909 ng/mL — AB (ref 22–322)

## 2014-05-04 MED ORDER — EPOETIN ALFA 10000 UNIT/ML IJ SOLN
10000.0000 [IU] | INTRAMUSCULAR | Status: DC
  Administered 2014-05-04: 10000 [IU] via SUBCUTANEOUS
  Filled 2014-05-04: qty 1

## 2014-05-04 NOTE — Discharge Instructions (Signed)
Epoetin Alfa injection What is this medicine? EPOETIN ALFA (e POE e tin AL fa) helps your body make more red blood cells. This medicine is used to treat anemia caused by chronic kidney failure, cancer chemotherapy, or HIV-therapy. It may also be used before surgery if you have anemia. This medicine may be used for other purposes; ask your health care provider or pharmacist if you have questions. COMMON BRAND NAME(S): Epogen, Procrit What should I tell my health care provider before I take this medicine? They need to know if you have any of these conditions: -blood clotting disorders -cancer patient not on chemotherapy -cystic fibrosis -heart disease, such as angina or heart failure -hemoglobin level of 12 g/dL or greater -high blood pressure -low levels of folate, iron, or vitamin B12 -seizures -an unusual or allergic reaction to erythropoietin, albumin, benzyl alcohol, hamster proteins, other medicines, foods, dyes, or preservatives -pregnant or trying to get pregnant -breast-feeding How should I use this medicine? This medicine is for injection into a vein or under the skin. It is usually given by a health care professional in a hospital or clinic setting. If you get this medicine at home, you will be taught how to prepare and give this medicine. Use exactly as directed. Take your medicine at regular intervals. Do not take your medicine more often than directed. It is important that you put your used needles and syringes in a special sharps container. Do not put them in a trash can. If you do not have a sharps container, call your pharmacist or healthcare provider to get one. Talk to your pediatrician regarding the use of this medicine in children. While this drug may be prescribed for selected conditions, precautions do apply. Overdosage: If you think you have taken too much of this medicine contact a poison control center or emergency room at once. NOTE: This medicine is only for you. Do  not share this medicine with others. What if I miss a dose? If you miss a dose, take it as soon as you can. If it is almost time for your next dose, take only that dose. Do not take double or extra doses. What may interact with this medicine? Do not take this medicine with any of the following medications: -darbepoetin alfa This list may not describe all possible interactions. Give your health care provider a list of all the medicines, herbs, non-prescription drugs, or dietary supplements you use. Also tell them if you smoke, drink alcohol, or use illegal drugs. Some items may interact with your medicine. What should I watch for while using this medicine? Visit your prescriber or health care professional for regular checks on your progress and for the needed blood tests and blood pressure measurements. It is especially important for the doctor to make sure your hemoglobin level is in the desired range, to limit the risk of potential side effects and to give you the best benefit. Keep all appointments for any recommended tests. Check your blood pressure as directed. Ask your doctor what your blood pressure should be and when you should contact him or her. As your body makes more red blood cells, you may need to take iron, folic acid, or vitamin B supplements. Ask your doctor or health care provider which products are right for you. If you have kidney disease continue dietary restrictions, even though this medication can make you feel better. Talk with your doctor or health care professional about the foods you eat and the vitamins that you take. What  side effects may I notice from receiving this medicine? Side effects that you should report to your doctor or health care professional as soon as possible: -allergic reactions like skin rash, itching or hives, swelling of the face, lips, or tongue -breathing problems -changes in vision -chest pain -confusion, trouble speaking or understanding -feeling  faint or lightheaded, falls -high blood pressure -muscle aches or pains -pain, swelling, warmth in the leg -rapid weight gain -severe headaches -sudden numbness or weakness of the face, arm or leg -trouble walking, dizziness, loss of balance or coordination -seizures (convulsions) -swelling of the ankles, feet, hands -unusually weak or tired Side effects that usually do not require medical attention (report to your doctor or health care professional if they continue or are bothersome): -diarrhea -fever, chills (flu-like symptoms) -headaches -nausea, vomiting -redness, stinging, or swelling at site where injected This list may not describe all possible side effects. Call your doctor for medical advice about side effects. You may report side effects to FDA at 1-800-FDA-1088. Where should I keep my medicine? Keep out of the reach of children. Store in a refrigerator between 2 and 8 degrees C (36 and 46 degrees F). Do not freeze or shake. Throw away any unused portion if using a single-dose vial. Multi-dose vials can be kept in the refrigerator for up to 21 days after the initial dose. Throw away unused medicine. NOTE: This sheet is a summary. It may not cover all possible information. If you have questions about this medicine, talk to your doctor, pharmacist, or health care provider.  2015, Elsevier/Gold Standard. (2008-01-25 10:25:44)

## 2014-05-09 ENCOUNTER — Encounter: Payer: Self-pay | Admitting: Gastroenterology

## 2014-05-09 ENCOUNTER — Ambulatory Visit (INDEPENDENT_AMBULATORY_CARE_PROVIDER_SITE_OTHER): Payer: Medicare HMO | Admitting: Gastroenterology

## 2014-05-09 VITALS — BP 110/60 | HR 66 | Ht 76.0 in | Wt 175.0 lb

## 2014-05-09 DIAGNOSIS — K5090 Crohn's disease, unspecified, without complications: Secondary | ICD-10-CM

## 2014-05-09 NOTE — Patient Instructions (Signed)
You should follow up with your GI team at the [facility] Where you are getting your remicade. Call if you need anything.

## 2014-05-09 NOTE — Progress Notes (Signed)
Review of gastrointestinal problems:  1. crohn's diagnosed in 1970. He had abd surgeries (one in 70's, one in 80's). He believes only ileum was removed.Colonoscopy with Dr. Lajoyce Corners in mid 2008: this showed a wide-open IC valve, biopsies of ileum and colon showed chronic, active colitis however macroscopically he described it as a fairly normal-appearing. Colonoscopy Dr. Ardis Hughs 12/11 found no colitis, but actively ulcerated and inflamed neoterminal ileum (while he was on prednisone). 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 July, 2012: Doing very well on maintenance Remicade, cholestyramine.   November 2013 TB skin testing was negative   Remicade started February 2012 at 5 mg per kilogram   Cholestyramine started March, 2012; takes one per day (Jan 2014) 2. acute cholecystitis May 2012. Laparoscopic cholecystectomy   HPI: This is a  very pleasant 69 year old man whom I last saw about 2 years ago.  I last saw him about 2 years ago. At that time I have asked him to come back to see me in 6 months however he did not.  It turns out that he changed his care to the [facility] [place] system, has been getting Remicade through them he has been seeing a gastroenterologist at the [facility] for the past 2 years.  He is seeing at the [facility] and that is where is getting his remicade.  He's being doing very well. Has minor urgency at times.    No overt bleeding.  Has been gaining weight.  He stopped smoking, 2 years ago.  Was not easy but he did it.  Past Medical History  Diagnosis Date  . Crohn's   . Anxiety   . Depression   . Renal insufficiency   . Anemia   . Edema   . Other abnormal blood chemistry   . Malnutrition of mild degree   . Acute cholecystitis   . Abdominal pain, unspecified site   . Viremia, unspecified   . Pain in joint, lower leg   . Loss of weight   . Regional enteritis of small intestine   . Benign paroxysmal positional vertigo   . Other B-complex deficiencies   .  Hypertensive renal disease, benign   . Other B-complex deficiencies   . Anemia, unspecified   . Other specified disease of white blood cells   . Chronic kidney disease, stage III (moderate)   . Unspecified disorder of skin and subcutaneous tissue   . Tobacco use disorder   . Celiac disease   . Essential and other specified forms of tremor   . Abdominal pain, right lower quadrant   . Hypertrophy of prostate without urinary obstruction and other lower urinary tract symptoms (LUTS)   . Dysuria   . Dizziness and giddiness   . Encounter for long-term (current) use of other medications   . Iron deficiency anemia, unspecified   . Essential hypertension, benign   . Other extrapyramidal disease and abnormal movement disorder   . Pain in limb   . Other extrapyramidal disease and abnormal movement disorder   . Insomnia with sleep apnea, unspecified   . Insomnia with sleep apnea, unspecified   . Other specified disease of white blood cells   . Narcolepsy without cataplexy   . Regional enteritis of small intestine with large intestine   . Hypopotassemia   . Nausea with vomiting   . Viral warts, unspecified   . Impotence of organic origin   . Cough   . Postinflammatory pulmonary fibrosis   . Cellulitis and abscess of unspecified site   .  Dizziness and giddiness   . Dizziness and giddiness   . Other malaise and fatigue   . Diarrhea   . Bipolar I disorder, most recent episode (or current) unspecified   . Impacted cerumen   . Neuralgia, neuritis, and radiculitis, unspecified     Past Surgical History  Procedure Laterality Date  . Small intestine surgery      x 2  . Cholecystectomy  07-12-2010    Current Outpatient Prescriptions  Medication Sig Dispense Refill  . albuterol (PROVENTIL HFA;VENTOLIN HFA) 108 (90 BASE) MCG/ACT inhaler Inhale 2 puffs TID x 7 days then BID x 7 days then daily x 7 days and stop. Rinse mouth after each use 1 Inhaler 0  . amLODipine (NORVASC) 10 MG tablet Take  10 mg by mouth Nightly.      . Calcium Carbonate-Vit D-Min 600-400 MG-UNIT TABS Take 1 tablet by mouth 2 (two) times daily.      . cholestyramine (QUESTRAN) 4 GM/DOSE powder TAKE 1 SCOOPFUL DISSOLVED IN WATER ONCE DAILY. 378 g 3  . diphenhydrAMINE (BENADRYL) 25 mg capsule Take 1-2 capsules (25-50 mg total) by mouth once. Prior to infusion 2 capsule 0  . divalproex (DEPAKOTE) 500 MG EC tablet Take 1,500 mg by mouth daily.     Marland Kitchen epoetin alfa (PROCRIT) 76734 UNIT/ML injection Inject 10,000 Units into the skin every 14 (fourteen) days.     . ferrous sulfate 325 (65 FE) MG tablet Take 325 mg by mouth 2 (two) times daily.      Marland Kitchen inFLIXimab (REMICADE) 100 MG injection Infuse Remicade IV schedule 1 24m/kg every 8 weeks Premedicate with Tylenol 500-656mby mouth and Benadryl 25-5019my mouth prior to infusion. Last PPD was on 12/2009.  1 each 6  . Loperamide HCl (LOPERAMIDE A-D PO) Take 1 capsule by mouth as needed.      . magnesium oxide (MAG-OX) 400 MG tablet Take 400 mg by mouth 2 (two) times daily.     . metoprolol succinate (TOPROL-XL) 25 MG 24 hr tablet Take 25 mg by mouth daily.      . Multiple Vitamin (MULTIVITAMIN) tablet Take 1 tablet by mouth daily.      . MMarland KitchenRBETRIQ 50 MG TB24 tablet Take 50 mg by mouth daily.  0  . OLANZapine (ZYPREXA) 10 MG tablet Take 10 mg by mouth at bedtime.      . oMarland Kitcheneprazole (PRILOSEC) 20 MG capsule Take 20 mg by mouth daily.      . oMarland Kitchenybutynin (DITROPAN XL) 15 MG 24 hr tablet Take 15 mg by mouth daily.      . Tamsulosin HCl (FLOMAX) 0.4 MG CAPS Take 0.4 mg by mouth daily.    . vitamin E 400 UNIT capsule Take 400 Units by mouth daily.      . zMarland Kitchenster vaccine live, PF, (ZOSTAVAX) 19419379T/0.65ML injection Inject 19,400 Units into the skin once. 1 each 0   No current facility-administered medications for this visit.    Allergies as of 05/09/2014 - Review Complete 05/09/2014  Allergen Reaction Noted  . Azathioprine    . Ciprofloxacin  06/03/2012  . Levaquin  [levofloxacin in d5w]  06/03/2012  . Plendil [felodipine]  06/03/2012    Family History  Problem Relation Age of Onset  . Diabetes Mother     maternal grandmother  . Pneumonia Maternal Grandmother   . Emphysema Father   . Colon cancer Neg Hx     History   Social History  . Marital Status: Divorced  Spouse Name: N/A  . Number of Children: 1  . Years of Education: N/A   Occupational History  . retired   . Veteran    Social History Main Topics  . Smoking status: Former Smoker -- 1.00 packs/day for 49 years    Types: Cigarettes    Start date: 04/11/1956    Quit date: 03/31/2012  . Smokeless tobacco: Never Used  . Alcohol Use: No  . Drug Use: No  . Sexual Activity: No   Other Topics Concern  . Not on file   Social History Narrative      Physical Exam: BP 110/60 mmHg  Pulse 66  Ht 6' 4"  (1.93 m)  Wt 175 lb (79.379 kg)  BMI 21.31 kg/m2 Constitutional: generally well-appearing Psychiatric: alert and oriented x3 Abdomen: soft, nontender, nondistended, no obvious ascites, no peritoneal signs, normal bowel sounds     Assessment and plan: 69 y.o. male with Crohn's disease  He has been getting his care at the [facility] and he is getting Remicade through the [facility]. He is not really sure why this appointment was set up. I am not either. He is happy with his care through the [facility] and he plans to maintain that relationship. I explained that I'm happy to see him if he needs in the future however he certainly does not need to gastroenterologist taking care of his chronic disease on routine basis.  He will therefore see me on an as-needed basis but will maintain chronic care through the [facility] [place] system where he has been getting his Remicade for the past 2 years.

## 2014-06-01 ENCOUNTER — Encounter (HOSPITAL_COMMUNITY): Payer: Self-pay

## 2014-06-01 ENCOUNTER — Encounter (HOSPITAL_COMMUNITY)
Admission: RE | Admit: 2014-06-01 | Discharge: 2014-06-01 | Disposition: A | Discharge status: Home / Self Care | Payer: Medicare HMO | Source: Ambulatory Visit | Attending: Gastroenterology | Admitting: Gastroenterology

## 2014-06-01 DIAGNOSIS — D6489 Other specified anemias: Secondary | ICD-10-CM | POA: Diagnosis not present

## 2014-06-01 DIAGNOSIS — N183 Chronic kidney disease, stage 3 (moderate): Secondary | ICD-10-CM | POA: Diagnosis not present

## 2014-06-01 LAB — IRON AND TIBC
IRON: 96 ug/dL (ref 42–165)
Saturation Ratios: 47 % (ref 20–55)
TIBC: 204 ug/dL — ABNORMAL LOW (ref 215–435)
UIBC: 108 ug/dL — ABNORMAL LOW (ref 125–400)

## 2014-06-01 LAB — FERRITIN: Ferritin: 977 ng/mL — ABNORMAL HIGH (ref 22–322)

## 2014-06-01 LAB — HEMOGLOBIN: Hemoglobin: 11.8 g/dL — ABNORMAL LOW (ref 13.0–17.0)

## 2014-06-29 ENCOUNTER — Encounter (HOSPITAL_COMMUNITY): Admission: RE | Admit: 2014-06-29 | Payer: Medicare HMO | Source: Ambulatory Visit

## 2014-07-11 ENCOUNTER — Encounter (HOSPITAL_COMMUNITY)
Admission: RE | Admit: 2014-07-11 | Discharge: 2014-07-11 | Disposition: A | Discharge status: Home / Self Care | Payer: Medicare HMO | Source: Ambulatory Visit | Attending: Gastroenterology | Admitting: Gastroenterology

## 2014-07-11 ENCOUNTER — Encounter (HOSPITAL_COMMUNITY): Payer: Self-pay

## 2014-07-11 DIAGNOSIS — D6489 Other specified anemias: Secondary | ICD-10-CM | POA: Insufficient documentation

## 2014-07-11 DIAGNOSIS — N183 Chronic kidney disease, stage 3 (moderate): Secondary | ICD-10-CM | POA: Diagnosis not present

## 2014-07-11 LAB — IRON AND TIBC
Iron: 74 ug/dL (ref 45–182)
Saturation Ratios: 31 % (ref 17.9–39.5)
TIBC: 239 ug/dL — ABNORMAL LOW (ref 250–450)
UIBC: 165 ug/dL

## 2014-07-11 LAB — FERRITIN: FERRITIN: 737 ng/mL — AB (ref 24–336)

## 2014-07-11 LAB — HEMOGLOBIN: Hemoglobin: 11.4 g/dL — ABNORMAL LOW (ref 13.0–17.0)

## 2014-07-11 MED ORDER — CLONIDINE HCL 0.1 MG PO TABS
0.1000 mg | ORAL_TABLET | Freq: Once | ORAL | Status: DC
  Filled 2014-07-11: qty 1

## 2014-07-11 MED ORDER — EPOETIN ALFA 10000 UNIT/ML IJ SOLN
10000.0000 [IU] | INTRAMUSCULAR | Status: DC
  Administered 2014-07-11: 10000 [IU] via SUBCUTANEOUS
  Filled 2014-07-11: qty 1

## 2014-07-21 ENCOUNTER — Ambulatory Visit (INDEPENDENT_AMBULATORY_CARE_PROVIDER_SITE_OTHER): Payer: Medicare HMO | Admitting: Internal Medicine

## 2014-07-21 ENCOUNTER — Encounter: Payer: Self-pay | Admitting: Internal Medicine

## 2014-07-21 VITALS — BP 112/68 | HR 73 | Temp 98.3°F | Resp 18 | Ht 76.0 in | Wt 179.0 lb

## 2014-07-21 DIAGNOSIS — N183 Chronic kidney disease, stage 3 unspecified: Secondary | ICD-10-CM

## 2014-07-21 DIAGNOSIS — H8113 Benign paroxysmal vertigo, bilateral: Secondary | ICD-10-CM | POA: Diagnosis not present

## 2014-07-21 DIAGNOSIS — R609 Edema, unspecified: Secondary | ICD-10-CM

## 2014-07-21 DIAGNOSIS — Z13220 Encounter for screening for lipoid disorders: Secondary | ICD-10-CM | POA: Diagnosis not present

## 2014-07-21 DIAGNOSIS — H6123 Impacted cerumen, bilateral: Secondary | ICD-10-CM | POA: Diagnosis not present

## 2014-07-21 NOTE — Progress Notes (Signed)
Patient ID: Brent [name], male   DOB: 01/02/1946, 69 y.o.   MRN: [id number]   Location:  [facility]   Chief Complaint  Patient presents with  . Dizziness    dizziness every now and then,swelling in both feet, more in the day time than at night    HPI: Patient is a 69 y.o. black male seen in the office today  Dizziness going on about 2 wks, and has not gone away.  When he turns his head side to side it comes on.  Not when he changes from lying to sitting or sitting to standing, only when moves head or eyes.  No falls.  No blurry vision.  No tinnitus.  No weakness, numbness, tingling, speech changes.    Quit smoking on 04/17/14 and does not plan to go back to it.  Now he's drinking coffee all of the time.  He also has edema of bilateral feet and ankles.  He admits he has a lot of sodium in his diet.  He loves salt.  He will try to cut back and elevate his feet.  Tall thin Review of Systems:  Review of Systems  Constitutional: Negative for fever and chills.  HENT: Positive for hearing loss.        Cerumen impaction  Respiratory: Negative for cough and shortness of breath.        Quit smoking   Cardiovascular: Positive for leg swelling. Negative for chest pain.  Gastrointestinal: Negative for abdominal pain.  Musculoskeletal: Negative for falls.  Skin: Negative for rash.  Neurological: Positive for dizziness. Negative for loss of consciousness.    Past Medical History  Diagnosis Date  . Crohn's   . Anxiety   . Depression   . Renal insufficiency   . Anemia   . Edema   . Other abnormal blood chemistry   . Malnutrition of mild degree   . Acute cholecystitis   . Abdominal pain, unspecified site   . Viremia, unspecified   . Pain in joint, lower leg   . Loss of weight   . Regional enteritis of small intestine   . Benign paroxysmal positional vertigo   . Other B-complex deficiencies   . Hypertensive renal disease, benign   . Other  B-complex deficiencies   . Anemia, unspecified   . Other specified disease of white blood cells   . Chronic kidney disease, stage III (moderate)   . Unspecified disorder of skin and subcutaneous tissue   . Tobacco use disorder   . Celiac disease   . Essential and other specified forms of tremor   . Abdominal pain, right lower quadrant   . Hypertrophy of prostate without urinary obstruction and other lower urinary tract symptoms (LUTS)   . Dysuria   . Dizziness and giddiness   . Encounter for long-term (current) use of other medications   . Iron deficiency anemia, unspecified   . Essential hypertension, benign   . Other extrapyramidal disease and abnormal movement disorder   . Pain in limb   . Other extrapyramidal disease and abnormal movement disorder   . Insomnia with sleep apnea, unspecified   . Insomnia with sleep apnea, unspecified   . Other specified disease of white blood cells   . Narcolepsy without cataplexy   . Regional enteritis of small intestine with large intestine   . Hypopotassemia   . Nausea with vomiting   . Viral warts, unspecified   . Impotence of organic  origin   . Cough   . Postinflammatory pulmonary fibrosis   . Cellulitis and abscess of unspecified site   . Dizziness and giddiness   . Dizziness and giddiness   . Other malaise and fatigue   . Diarrhea   . Bipolar I disorder, most recent episode (or current) unspecified   . Impacted cerumen   . Neuralgia, neuritis, and radiculitis, unspecified     Past Surgical History  Procedure Laterality Date  . Small intestine surgery      x 2  . Cholecystectomy  07-12-2010    Allergies  Allergen Reactions  . Azathioprine     REACTION: affected WBC  . Ciprofloxacin     Pt denies any reaction  . Levaquin [Levofloxacin In D5w]     Pt denies any reaction  . Plendil [Felodipine]     Pt denies any reaction   Medications: Patient's Medications  New Prescriptions   No medications on file  Previous  Medications   ALBUTEROL (PROVENTIL HFA;VENTOLIN HFA) 108 (90 BASE) MCG/ACT INHALER    Inhale 2 puffs TID x 7 days then BID x 7 days then daily x 7 days and stop. Rinse mouth after each use   AMLODIPINE (NORVASC) 10 MG TABLET    Take 10 mg by mouth Nightly.     CALCIUM CARBONATE-VIT D-MIN 600-400 MG-UNIT TABS    Take 1 tablet by mouth 2 (two) times daily.     CHOLESTYRAMINE (QUESTRAN) 4 GM/DOSE POWDER    TAKE 1 SCOOPFUL DISSOLVED IN WATER ONCE DAILY.   DIPHENHYDRAMINE (BENADRYL) 25 MG CAPSULE    Take 1-2 capsules (25-50 mg total) by mouth once. Prior to infusion   DIVALPROEX (DEPAKOTE) 500 MG EC TABLET    Take 1,500 mg by mouth daily.    EPOETIN ALFA (PROCRIT) 62831 UNIT/ML INJECTION    Inject 10,000 Units into the skin every 14 (fourteen) days.    FERROUS SULFATE 325 (65 FE) MG TABLET    Take 325 mg by mouth 2 (two) times daily.     INFLIXIMAB (REMICADE) 100 MG INJECTION    Infuse Remicade IV schedule 1 9m/kg every 8 weeks Premedicate with Tylenol 500-6530mby mouth and Benadryl 25-5058my mouth prior to infusion. Last PPD was on 12/2009.    LOPERAMIDE HCL (LOPERAMIDE A-D PO)    Take 1 capsule by mouth as needed.     MAGNESIUM OXIDE (MAG-OX) 400 MG TABLET    Take 400 mg by mouth 2 (two) times daily.    METOPROLOL SUCCINATE (TOPROL-XL) 25 MG 24 HR TABLET    Take 25 mg by mouth daily.     MULTIPLE VITAMIN (MULTIVITAMIN) TABLET    Take 1 tablet by mouth daily.     MYRBETRIQ 50 MG TB24 TABLET    Take 50 mg by mouth daily.   OLANZAPINE (ZYPREXA) 10 MG TABLET    Take 10 mg by mouth at bedtime.     OMEPRAZOLE (PRILOSEC) 20 MG CAPSULE    Take 20 mg by mouth daily.     OXYBUTYNIN (DITROPAN XL) 15 MG 24 HR TABLET    Take 15 mg by mouth daily.     TAMSULOSIN HCL (FLOMAX) 0.4 MG CAPS    Take 0.4 mg by mouth daily.   VITAMIN E 400 UNIT CAPSULE    Take 400 Units by mouth daily.     ZOSTER VACCINE LIVE, PF, (ZOSTAVAX) 19451761T/0.65ML INJECTION    Inject 19,400 Units into the skin once.  Modified  Medications  No medications on file  Discontinued Medications   No medications on file    Physical Exam: Filed Vitals:   07/21/14 0848  BP: 112/68  Pulse: 73  Temp: 98.3 F (36.8 C)  TempSrc: Oral  Resp: 18  Height: 6' 4"  (1.93 m)  Weight: 179 lb (81.194 kg)  SpO2: 96%   Physical Exam  Constitutional: He is oriented to person, place, and time.  Tall thin man, walks in unsteadily this am  HENT:  Head: Normocephalic and atraumatic.  Right Ear: External ear normal.  Left Ear: External ear normal.  Bilateral ears with cerumen impaction--appears moist and soft  Eyes: EOM are normal. Pupils are equal, round, and reactive to light.  Cardiovascular: Normal rate, regular rhythm, normal heart sounds and intact distal pulses.   Pulmonary/Chest: Effort normal and breath sounds normal. No respiratory distress.  Musculoskeletal: Normal range of motion.  Neurological: He is alert and oriented to person, place, and time. No cranial nerve deficit.  When head turned, induces vertigo with nystagmus  Skin: Skin is warm and dry.    Labs reviewed: Basic Metabolic Panel:  Recent Labs  02/08/14 0843  NA 144  K 4.1  CL 107  CO2 24  GLUCOSE 106*  BUN 18  CREATININE 1.83*  CALCIUM 8.9  TSH 1.480   Liver Function Tests:  Recent Labs  02/08/14 0843  AST 19  ALT 19  ALKPHOS 76  BILITOT 0.4  PROT 6.4   No results for input(s): LIPASE, AMYLASE in the last 8760 hours. No results for input(s): AMMONIA in the last 8760 hours. CBC:  Recent Labs  02/08/14 0843  05/04/14 1216 06/01/14 1105 07/11/14 1105  WBC 9.6  --   --   --   --   NEUTROABS 5.5  --   --   --   --   HGB 11.9*  < > 11.4* 11.8* 11.4*  HCT 35.3*  --   --   --   --   MCV 100*  --   --   --   --   < > = values in this interval not displayed. Lipid Panel:  Recent Labs  02/08/14 0843  CHOL 139  HDL 40  LDLCALC 74  TRIG 127  CHOLHDL 3.5   Assessment/Plan 1. Benign paroxysmal positional vertigo,  bilateral -suspect this is due to his bilateral cerumen impaction  2. Cerumen impaction, bilateral --ear lavage performed by CMA bilaterally with warm water and peroxide; no instrumentation was required -if symptoms do not improve by routine visit, will refer for PT for BPPV   3. Edema -advised low sodium diet and elevate feet at rest -check cmp to assess renal function  Labs/tests ordered:  Cmp, flp that were ordered for before his next appt Next appt:  Keep regular visit as scheduled for med mgt  Kierah Goatley L. Berdella Bacot, D.O. [facility] [address], [place] [address] Cell Phone (Mon-Fri 8am-5pm):  [phone] On Call:  [phone] & follow prompts after 5pm & weekends Office Phone:  [phone] Office Fax:  [phone]

## 2014-07-21 NOTE — Patient Instructions (Signed)
Cut back on your salt. Elevate your feet at rest and monitor your swelling. We can readdress at your routine visit.

## 2014-07-22 LAB — COMPREHENSIVE METABOLIC PANEL
ALT: 35 IU/L (ref 0–44)
AST: 17 IU/L (ref 0–40)
Albumin/Globulin Ratio: 1.5 (ref 1.1–2.5)
Albumin: 3.8 g/dL (ref 3.6–4.8)
Alkaline Phosphatase: 91 IU/L (ref 39–117)
BUN/Creatinine Ratio: 14 (ref 10–22)
BUN: 23 mg/dL (ref 8–27)
Bilirubin Total: 0.4 mg/dL (ref 0.0–1.2)
CO2: 24 mmol/L (ref 18–29)
Calcium: 8.6 mg/dL (ref 8.6–10.2)
Chloride: 101 mmol/L (ref 97–108)
Creatinine, Ser: 1.65 mg/dL — ABNORMAL HIGH (ref 0.76–1.27)
GFR calc Af Amer: 48 mL/min/{1.73_m2} — ABNORMAL LOW (ref >59)
GFR calc non Af Amer: 42 mL/min/{1.73_m2} — ABNORMAL LOW (ref >59)
Globulin, Total: 2.5 g/dL (ref 1.5–4.5)
Glucose: 84 mg/dL (ref 65–99)
Potassium: 3.9 mmol/L (ref 3.5–5.2)
Sodium: 141 mmol/L (ref 134–144)
Total Protein: 6.3 g/dL (ref 6.0–8.5)

## 2014-07-22 LAB — LIPID PANEL
Chol/HDL Ratio: 3 ratio units (ref 0.0–5.0)
Cholesterol, Total: 118 mg/dL (ref 100–199)
HDL: 39 mg/dL — ABNORMAL LOW (ref >39)
LDL Calculated: 59 mg/dL (ref 0–99)
Triglycerides: 101 mg/dL (ref 0–149)
VLDL Cholesterol Cal: 20 mg/dL (ref 5–40)

## 2014-07-27 ENCOUNTER — Encounter (HOSPITAL_COMMUNITY): Payer: Medicare HMO

## 2014-08-04 ENCOUNTER — Other Ambulatory Visit: Payer: Self-pay | Admitting: *Deleted

## 2014-08-04 DIAGNOSIS — I10 Essential (primary) hypertension: Secondary | ICD-10-CM

## 2014-08-08 ENCOUNTER — Encounter (HOSPITAL_COMMUNITY)
Admission: RE | Admit: 2014-08-08 | Discharge: 2014-08-08 | Disposition: A | Discharge status: Home / Self Care | Payer: Medicare HMO | Source: Ambulatory Visit | Attending: Gastroenterology | Admitting: Gastroenterology

## 2014-08-08 ENCOUNTER — Other Ambulatory Visit: Payer: Medicare HMO

## 2014-08-08 ENCOUNTER — Encounter (HOSPITAL_COMMUNITY): Payer: Self-pay

## 2014-08-08 DIAGNOSIS — D6489 Other specified anemias: Secondary | ICD-10-CM | POA: Diagnosis not present

## 2014-08-08 DIAGNOSIS — I10 Essential (primary) hypertension: Secondary | ICD-10-CM

## 2014-08-08 DIAGNOSIS — N183 Chronic kidney disease, stage 3 (moderate): Secondary | ICD-10-CM | POA: Insufficient documentation

## 2014-08-08 LAB — IRON AND TIBC
Iron: 68 ug/dL (ref 45–182)
Saturation Ratios: 31 % (ref 17.9–39.5)
TIBC: 217 ug/dL — ABNORMAL LOW (ref 250–450)
UIBC: 149 ug/dL

## 2014-08-08 LAB — FERRITIN: FERRITIN: 658 ng/mL — AB (ref 24–336)

## 2014-08-08 LAB — HEMOGLOBIN: Hemoglobin: 11 g/dL — ABNORMAL LOW (ref 13.0–17.0)

## 2014-08-08 MED ORDER — EPOETIN ALFA 10000 UNIT/ML IJ SOLN
10000.0000 [IU] | INTRAMUSCULAR | Status: DC
  Administered 2014-08-08: 10000 [IU] via SUBCUTANEOUS
  Filled 2014-08-08: qty 1

## 2014-08-09 LAB — COMPREHENSIVE METABOLIC PANEL
ALT: 23 IU/L (ref 0–44)
AST: 20 IU/L (ref 0–40)
Albumin/Globulin Ratio: 1.2 (ref 1.1–2.5)
Albumin: 3.4 g/dL — ABNORMAL LOW (ref 3.6–4.8)
Alkaline Phosphatase: 90 IU/L (ref 39–117)
BUN/Creatinine Ratio: 12 (ref 10–22)
BUN: 20 mg/dL (ref 8–27)
Bilirubin Total: 0.4 mg/dL (ref 0.0–1.2)
CO2: 21 mmol/L (ref 18–29)
Calcium: 8.8 mg/dL (ref 8.6–10.2)
Chloride: 103 mmol/L (ref 97–108)
Creatinine, Ser: 1.72 mg/dL — ABNORMAL HIGH (ref 0.76–1.27)
GFR calc Af Amer: 46 mL/min/{1.73_m2} — ABNORMAL LOW (ref >59)
GFR calc non Af Amer: 40 mL/min/{1.73_m2} — ABNORMAL LOW (ref >59)
Globulin, Total: 2.9 g/dL (ref 1.5–4.5)
Glucose: 134 mg/dL — ABNORMAL HIGH (ref 65–99)
Potassium: 3.9 mmol/L (ref 3.5–5.2)
Sodium: 141 mmol/L (ref 134–144)
Total Protein: 6.3 g/dL (ref 6.0–8.5)

## 2014-08-09 LAB — LIPID PANEL
Chol/HDL Ratio: 3.4 ratio units (ref 0.0–5.0)
Cholesterol, Total: 124 mg/dL (ref 100–199)
HDL: 37 mg/dL — ABNORMAL LOW (ref >39)
LDL Calculated: 55 mg/dL (ref 0–99)
Triglycerides: 159 mg/dL — ABNORMAL HIGH (ref 0–149)
VLDL Cholesterol Cal: 32 mg/dL (ref 5–40)

## 2014-08-10 ENCOUNTER — Ambulatory Visit (INDEPENDENT_AMBULATORY_CARE_PROVIDER_SITE_OTHER): Payer: Medicare HMO | Admitting: Internal Medicine

## 2014-08-10 ENCOUNTER — Encounter: Payer: Self-pay | Admitting: Internal Medicine

## 2014-08-10 VITALS — BP 110/78 | HR 74 | Temp 98.0°F | Resp 20 | Ht 76.0 in | Wt 178.8 lb

## 2014-08-10 DIAGNOSIS — H6123 Impacted cerumen, bilateral: Secondary | ICD-10-CM | POA: Diagnosis not present

## 2014-08-10 DIAGNOSIS — K50111 Crohn's disease of large intestine with rectal bleeding: Secondary | ICD-10-CM

## 2014-08-10 DIAGNOSIS — I10 Essential (primary) hypertension: Secondary | ICD-10-CM | POA: Diagnosis not present

## 2014-08-10 DIAGNOSIS — N183 Chronic kidney disease, stage 3 unspecified: Secondary | ICD-10-CM

## 2014-08-10 DIAGNOSIS — Z23 Encounter for immunization: Secondary | ICD-10-CM | POA: Diagnosis not present

## 2014-08-10 DIAGNOSIS — R609 Edema, unspecified: Secondary | ICD-10-CM

## 2014-08-10 DIAGNOSIS — H8113 Benign paroxysmal vertigo, bilateral: Secondary | ICD-10-CM

## 2014-08-10 DIAGNOSIS — N400 Enlarged prostate without lower urinary tract symptoms: Secondary | ICD-10-CM

## 2014-08-10 NOTE — Patient Instructions (Signed)
Debrox drops for ear wax.
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
## 2014-08-24 ENCOUNTER — Encounter (HOSPITAL_COMMUNITY): Payer: Medicare HMO

## 2014-09-05 ENCOUNTER — Encounter (HOSPITAL_COMMUNITY): Admission: RE | Admit: 2014-09-05 | Payer: Medicare HMO | Source: Ambulatory Visit

## 2014-09-05 ENCOUNTER — Other Ambulatory Visit (HOSPITAL_COMMUNITY): Payer: Self-pay | Admitting: Nephrology

## 2014-09-12 ENCOUNTER — Ambulatory Visit (HOSPITAL_COMMUNITY)
Admission: RE | Admit: 2014-09-12 | Discharge: 2014-09-12 | Disposition: A | Discharge status: Home / Self Care | Payer: Medicare HMO | Source: Ambulatory Visit | Attending: Nephrology | Admitting: Nephrology

## 2014-09-12 ENCOUNTER — Encounter (HOSPITAL_COMMUNITY): Payer: Self-pay

## 2014-09-12 DIAGNOSIS — N183 Chronic kidney disease, stage 3 (moderate): Secondary | ICD-10-CM | POA: Insufficient documentation

## 2014-09-12 DIAGNOSIS — D631 Anemia in chronic kidney disease: Secondary | ICD-10-CM | POA: Diagnosis present

## 2014-09-12 LAB — IRON AND TIBC
IRON: 64 ug/dL (ref 45–182)
Saturation Ratios: 29 % (ref 17.9–39.5)
TIBC: 218 ug/dL — ABNORMAL LOW (ref 250–450)
UIBC: 154 ug/dL

## 2014-09-12 LAB — HEMOGLOBIN: HEMOGLOBIN: 11.6 g/dL — AB (ref 13.0–17.0)

## 2014-09-12 LAB — FERRITIN: Ferritin: 912 ng/mL — ABNORMAL HIGH (ref 24–336)

## 2014-09-12 MED ORDER — CLONIDINE HCL 0.1 MG PO TABS
0.1000 mg | ORAL_TABLET | Freq: Once | ORAL | Status: DC
  Filled 2014-09-12: qty 1

## 2014-09-21 ENCOUNTER — Encounter (HOSPITAL_COMMUNITY): Payer: Medicare HMO

## 2014-10-03 ENCOUNTER — Ambulatory Visit (INDEPENDENT_AMBULATORY_CARE_PROVIDER_SITE_OTHER): Payer: Medicare HMO | Admitting: Nurse Practitioner

## 2014-10-03 ENCOUNTER — Encounter: Payer: Self-pay | Admitting: Nurse Practitioner

## 2014-10-03 ENCOUNTER — Encounter (HOSPITAL_COMMUNITY): Payer: Medicare HMO

## 2014-10-03 VITALS — BP 130/80 | HR 83 | Temp 98.1°F | Resp 20 | Ht 76.0 in | Wt 174.8 lb

## 2014-10-03 DIAGNOSIS — H6122 Impacted cerumen, left ear: Secondary | ICD-10-CM

## 2014-10-03 DIAGNOSIS — R42 Dizziness and giddiness: Secondary | ICD-10-CM

## 2014-10-03 LAB — POCT URINALYSIS DIPSTICK
BILIRUBIN UA: NEGATIVE
Glucose, UA: NEGATIVE
KETONES UA: NEGATIVE
Leukocytes, UA: NEGATIVE
NITRITE UA: NEGATIVE
Protein, UA: NEGATIVE
RBC UA: NEGATIVE
Spec Grav, UA: <=1.005
UROBILINOGEN UA: NEGATIVE
pH, UA: 5

## 2014-10-03 NOTE — Progress Notes (Signed)
Patient ID: Brent [name], male   DOB: [birth year], 69 y.o.   MRN: [id number]    PCP: Hollace Kinnier, DO  Allergies  Allergen Reactions  . Azathioprine     REACTION: affected WBC  . Ciprofloxacin     Pt denies any reaction  . Levaquin [Levofloxacin In D5w]     Pt denies any reaction  . Plendil [Felodipine]     Pt denies any reaction    Chief Complaint  Patient presents with  . Acute Visit    unable to get out of bed in the am, urine was smelly and dark     HPI: Patient is a 69 y.o. male seen in the office today due to dizziness, work up and could not get out of bed he was so dizzy. Today it is much better. Slightly there. Dizziness when turns his head side to side and changes from lying to sitting or sitting to standing.  No blurry vision.  No tinnitus.  No weakness, numbness, tingling, speech changes. no falls.  Did not take any medication for dizziness.  Urine was dark yesterday. Better today.  Not drinking a lot of water.  No swelling in legs Has quit smoking. Drinking 4-5 cups of coffee/caffeine a day.   Review of Systems:  Review of Systems  Constitutional: Negative for fever, chills, activity change and appetite change.  HENT: Positive for hearing loss. Negative for postnasal drip, sinus pressure and tinnitus.   Eyes: Negative for discharge and visual disturbance.  Respiratory: Negative for cough and shortness of breath.        Quit smoking   Cardiovascular: Negative for chest pain and leg swelling.  Gastrointestinal: Negative for abdominal pain.  Skin: Negative for rash.  Neurological: Positive for dizziness (improved). Negative for weakness.    Past Medical History  Diagnosis Date  . Crohn's   . Anxiety   . Depression   . Renal insufficiency   . Anemia   . Edema   . Other abnormal blood chemistry   . Malnutrition of mild degree   . Acute cholecystitis   . Abdominal pain, unspecified site   . Viremia, unspecified   . Pain in joint, lower leg   . Loss of  weight   . Regional enteritis of small intestine   . Benign paroxysmal positional vertigo   . Other B-complex deficiencies   . Hypertensive renal disease, benign   . Other B-complex deficiencies   . Anemia, unspecified   . Other specified disease of white blood cells   . Chronic kidney disease, stage III (moderate)   . Unspecified disorder of skin and subcutaneous tissue   . Tobacco use disorder   . Celiac disease   . Essential and other specified forms of tremor   . Abdominal pain, right lower quadrant   . Hypertrophy of prostate without urinary obstruction and other lower urinary tract symptoms (LUTS)   . Dysuria   . Dizziness and giddiness   . Encounter for long-term (current) use of other medications   . Iron deficiency anemia, unspecified   . Essential hypertension, benign   . Other extrapyramidal disease and abnormal movement disorder   . Pain in limb   . Other extrapyramidal disease and abnormal movement disorder   . Insomnia with sleep apnea, unspecified   . Insomnia with sleep apnea, unspecified   . Other specified disease of white blood cells   . Narcolepsy without cataplexy   . Regional enteritis of small intestine with large  intestine   . Hypopotassemia   . Nausea with vomiting   . Viral warts, unspecified   . Impotence of organic origin   . Cough   . Postinflammatory pulmonary fibrosis   . Cellulitis and abscess of unspecified site   . Dizziness and giddiness   . Dizziness and giddiness   . Other malaise and fatigue   . Diarrhea   . Bipolar I disorder, most recent episode (or current) unspecified   . Impacted cerumen   . Neuralgia, neuritis, and radiculitis, unspecified    Past Surgical History  Procedure Laterality Date  . Small intestine surgery      x 2  . Cholecystectomy  07-12-2010   Social History:   reports that he quit smoking about 5 months ago. His smoking use included Cigarettes. He started smoking about 58 years ago. He has a 49 pack-year  smoking history. He has never used smokeless tobacco. He reports that he does not drink alcohol or use illicit drugs.  Family History  Problem Relation Age of Onset  . Diabetes Mother     maternal grandmother  . Pneumonia Maternal Grandmother   . Emphysema Father   . Colon cancer Neg Hx     Medications: Patient's Medications  New Prescriptions   No medications on file  Previous Medications   ALBUTEROL (PROVENTIL HFA;VENTOLIN HFA) 108 (90 BASE) MCG/ACT INHALER    Inhale 2 puffs TID x 7 days then BID x 7 days then daily x 7 days and stop. Rinse mouth after each use   AMLODIPINE (NORVASC) 10 MG TABLET    Take 10 mg by mouth Nightly.     CALCIUM CARBONATE-VIT D-MIN 600-400 MG-UNIT TABS    Take 1 tablet by mouth 2 (two) times daily.     CHOLESTYRAMINE (QUESTRAN) 4 GM/DOSE POWDER    TAKE 1 SCOOPFUL DISSOLVED IN WATER ONCE DAILY.   DIPHENHYDRAMINE (BENADRYL) 25 MG CAPSULE    Take 1-2 capsules (25-50 mg total) by mouth once. Prior to infusion   DIVALPROEX (DEPAKOTE) 500 MG EC TABLET    Take 1,500 mg by mouth daily.    EPOETIN ALFA (PROCRIT) 21975 UNIT/ML INJECTION    Inject 10,000 Units into the skin every 14 (fourteen) days.    FERROUS SULFATE 325 (65 FE) MG TABLET    Take 325 mg by mouth 2 (two) times daily.     INFLIXIMAB (REMICADE) 100 MG INJECTION    Infuse Remicade IV schedule 1 44m/kg every 8 weeks Premedicate with Tylenol 500-6526mby mouth and Benadryl 25-5066my mouth prior to infusion. Last PPD was on 12/2009.    LOPERAMIDE HCL (LOPERAMIDE A-D PO)    Take 1 capsule by mouth as needed.     MAGNESIUM OXIDE (MAG-OX) 400 MG TABLET    Take 400 mg by mouth 2 (two) times daily.    METOPROLOL SUCCINATE (TOPROL-XL) 25 MG 24 HR TABLET    Take 25 mg by mouth daily.     MULTIPLE VITAMIN (MULTIVITAMIN) TABLET    Take 1 tablet by mouth daily.     MYRBETRIQ 50 MG TB24 TABLET    Take 50 mg by mouth daily.   OLANZAPINE (ZYPREXA) 10 MG TABLET    Take 10 mg by mouth at bedtime.     OMEPRAZOLE  (PRILOSEC) 20 MG CAPSULE    Take 20 mg by mouth daily.     OXYBUTYNIN (DITROPAN XL) 15 MG 24 HR TABLET    Take 15 mg by mouth daily.  TAMSULOSIN HCL (FLOMAX) 0.4 MG CAPS    Take 0.4 mg by mouth daily.   VITAMIN E 400 UNIT CAPSULE    Take 400 Units by mouth daily.     ZOSTER VACCINE LIVE, PF, (ZOSTAVAX) 32202 UNT/0.65ML INJECTION    Inject 19,400 Units into the skin once.  Modified Medications   No medications on file  Discontinued Medications   No medications on file     Physical Exam:  Filed Vitals:   10/03/14 1139  BP: 130/80  Pulse: 83  Temp: 98.1 F (36.7 C)  TempSrc: Oral  Resp: 20  Height: 6' 4"  (1.93 m)  Weight: 174 lb 12.8 oz (79.289 kg)  SpO2: 97%    Physical Exam  Constitutional: He is oriented to person, place, and time. He appears well-developed and well-nourished. No distress.  HENT:  Head: Normocephalic and atraumatic.  Right Ear: External ear normal.  Left Ear: External ear normal.  Left ear with cerumen impaction  Eyes: Conjunctivae and EOM are normal. Pupils are equal, round, and reactive to light.  Neck: Neck supple.  Cardiovascular: Normal rate, regular rhythm and normal heart sounds.   Pulmonary/Chest: Effort normal and breath sounds normal. No respiratory distress.  Musculoskeletal: Normal range of motion.  Neurological: He is alert and oriented to person, place, and time. No cranial nerve deficit.  Skin: Skin is warm and dry.    Labs reviewed: Basic Metabolic Panel:  Recent Labs  02/08/14 0843 07/21/14 0948 08/08/14 1032  NA 144 141 141  K 4.1 3.9 3.9  CL 107 101 103  CO2 24 24 21   GLUCOSE 106* 84 134*  BUN 18 23 20   CREATININE 1.83* 1.65* 1.72*  CALCIUM 8.9 8.6 8.8  TSH 1.480  --   --    Liver Function Tests:  Recent Labs  02/08/14 0843 07/21/14 0948 08/08/14 1032  AST 19 17 20   ALT 19 35 23  ALKPHOS 76 91 90  BILITOT 0.4 0.4 0.4  PROT 6.4 6.3 6.3   No results for input(s): LIPASE, AMYLASE in the last 8760 hours. No  results for input(s): AMMONIA in the last 8760 hours. CBC:  Recent Labs  02/08/14 0843  07/11/14 1105 08/08/14 1110 09/12/14 1350  WBC 9.6  --   --   --   --   NEUTROABS 5.5  --   --   --   --   HGB 11.9*  < > 11.4* 11.0* 11.6*  HCT 35.3*  --   --   --   --   MCV 100*  --   --   --   --   < > = values in this interval not displayed. Lipid Panel:  Recent Labs  02/08/14 0843 07/21/14 0948 08/08/14 1032  CHOL 139 118 124  HDL 40 39* 37*  LDLCALC 74 59 55  TRIG 127 101 159*  CHOLHDL 3.5 3.0 3.4   TSH:  Recent Labs  02/08/14 0843  TSH 1.480   A1C: No results found for: HGBA1C   Assessment/Plan 1. Dizzy spells BPPV, could be related to cerumen impaction which has been a problem in the past -improved today, discussed staying hydrated and cutting back on caffeine. Drinking decaf coffee - POC Urinalysis Dipstick normal  2. Cerumen impaction, left Easily removed by CMA with ear lavage   To keep follow up, follow up sooner if needed  Ainhoa Rallo K. Harle Battiest  [facility] [phone] 8 am - 5 pm) [phone] (after hours)

## 2014-10-03 NOTE — Patient Instructions (Signed)
Cut back on caffeine intake- drink decaf  Goal is 1 cup of caffeine and the rest decaf Drink water after cup of coffee  Stay hydrated  Keep follow up appt

## 2014-10-10 ENCOUNTER — Encounter (HOSPITAL_COMMUNITY): Payer: Self-pay

## 2014-10-10 ENCOUNTER — Encounter (HOSPITAL_COMMUNITY)
Admission: RE | Admit: 2014-10-10 | Discharge: 2014-10-10 | Disposition: A | Discharge status: Home / Self Care | Payer: Medicare HMO | Source: Ambulatory Visit | Attending: Gastroenterology | Admitting: Gastroenterology

## 2014-10-10 DIAGNOSIS — N183 Chronic kidney disease, stage 3 (moderate): Secondary | ICD-10-CM | POA: Insufficient documentation

## 2014-10-10 DIAGNOSIS — D6489 Other specified anemias: Secondary | ICD-10-CM | POA: Diagnosis not present

## 2014-10-10 LAB — IRON AND TIBC
Iron: 68 ug/dL (ref 45–182)
Saturation Ratios: 33 % (ref 17.9–39.5)
TIBC: 207 ug/dL — ABNORMAL LOW (ref 250–450)
UIBC: 139 ug/dL

## 2014-10-10 LAB — FERRITIN: FERRITIN: 934 ng/mL — AB (ref 24–336)

## 2014-10-10 LAB — HEMOGLOBIN: HEMOGLOBIN: 10.7 g/dL — AB (ref 13.0–17.0)

## 2014-10-10 MED ORDER — EPOETIN ALFA 10000 UNIT/ML IJ SOLN
10000.0000 [IU] | INTRAMUSCULAR | Status: DC
  Administered 2014-10-10: 10000 [IU] via SUBCUTANEOUS
  Filled 2014-10-10: qty 1

## 2014-10-10 NOTE — Discharge Instructions (Signed)
Epoetin Alfa injection What is this medicine? EPOETIN ALFA (e POE e tin AL fa) helps your body make more red blood cells. This medicine is used to treat anemia caused by chronic kidney failure, cancer chemotherapy, or HIV-therapy. It may also be used before surgery if you have anemia. This medicine may be used for other purposes; ask your health care provider or pharmacist if you have questions. COMMON BRAND NAME(S): Epogen, Procrit What should I tell my health care provider before I take this medicine? They need to know if you have any of these conditions: -blood clotting disorders -cancer patient not on chemotherapy -cystic fibrosis -heart disease, such as angina or heart failure -hemoglobin level of 12 g/dL or greater -high blood pressure -low levels of folate, iron, or vitamin B12 -seizures -an unusual or allergic reaction to erythropoietin, albumin, benzyl alcohol, hamster proteins, other medicines, foods, dyes, or preservatives -pregnant or trying to get pregnant -breast-feeding How should I use this medicine? This medicine is for injection into a vein or under the skin. It is usually given by a health care professional in a hospital or clinic setting. If you get this medicine at home, you will be taught how to prepare and give this medicine. Use exactly as directed. Take your medicine at regular intervals. Do not take your medicine more often than directed. It is important that you put your used needles and syringes in a special sharps container. Do not put them in a trash can. If you do not have a sharps container, call your pharmacist or healthcare provider to get one. Talk to your pediatrician regarding the use of this medicine in children. While this drug may be prescribed for selected conditions, precautions do apply. Overdosage: If you think you have taken too much of this medicine contact a poison control center or emergency room at once. NOTE: This medicine is only for you. Do  not share this medicine with others. What if I miss a dose? If you miss a dose, take it as soon as you can. If it is almost time for your next dose, take only that dose. Do not take double or extra doses. What may interact with this medicine? Do not take this medicine with any of the following medications: -darbepoetin alfa This list may not describe all possible interactions. Give your health care provider a list of all the medicines, herbs, non-prescription drugs, or dietary supplements you use. Also tell them if you smoke, drink alcohol, or use illegal drugs. Some items may interact with your medicine. What should I watch for while using this medicine? Visit your prescriber or health care professional for regular checks on your progress and for the needed blood tests and blood pressure measurements. It is especially important for the doctor to make sure your hemoglobin level is in the desired range, to limit the risk of potential side effects and to give you the best benefit. Keep all appointments for any recommended tests. Check your blood pressure as directed. Ask your doctor what your blood pressure should be and when you should contact him or her. As your body makes more red blood cells, you may need to take iron, folic acid, or vitamin B supplements. Ask your doctor or health care provider which products are right for you. If you have kidney disease continue dietary restrictions, even though this medication can make you feel better. Talk with your doctor or health care professional about the foods you eat and the vitamins that you take. What  side effects may I notice from receiving this medicine? Side effects that you should report to your doctor or health care professional as soon as possible: -allergic reactions like skin rash, itching or hives, swelling of the face, lips, or tongue -breathing problems -changes in vision -chest pain -confusion, trouble speaking or understanding -feeling  faint or lightheaded, falls -high blood pressure -muscle aches or pains -pain, swelling, warmth in the leg -rapid weight gain -severe headaches -sudden numbness or weakness of the face, arm or leg -trouble walking, dizziness, loss of balance or coordination -seizures (convulsions) -swelling of the ankles, feet, hands -unusually weak or tired Side effects that usually do not require medical attention (report to your doctor or health care professional if they continue or are bothersome): -diarrhea -fever, chills (flu-like symptoms) -headaches -nausea, vomiting -redness, stinging, or swelling at site where injected This list may not describe all possible side effects. Call your doctor for medical advice about side effects. You may report side effects to FDA at 1-800-FDA-1088. Where should I keep my medicine? Keep out of the reach of children. Store in a refrigerator between 2 and 8 degrees C (36 and 46 degrees F). Do not freeze or shake. Throw away any unused portion if using a single-dose vial. Multi-dose vials can be kept in the refrigerator for up to 21 days after the initial dose. Throw away unused medicine. NOTE: This sheet is a summary. It may not cover all possible information. If you have questions about this medicine, talk to your doctor, pharmacist, or health care provider.  2015, Elsevier/Gold Standard. (2008-01-25 10:25:44)

## 2014-10-10 NOTE — Progress Notes (Signed)
Lab operating under downtime. Hemoglobin results called with value of 10.7. Repeated for verification.

## 2014-10-19 ENCOUNTER — Encounter (HOSPITAL_COMMUNITY): Payer: Medicare HMO

## 2014-10-31 ENCOUNTER — Encounter (HOSPITAL_COMMUNITY): Payer: Medicare HMO

## 2014-11-07 ENCOUNTER — Encounter (HOSPITAL_COMMUNITY): Payer: Self-pay

## 2014-11-07 ENCOUNTER — Encounter (HOSPITAL_COMMUNITY)
Admission: RE | Admit: 2014-11-07 | Discharge: 2014-11-07 | Disposition: A | Discharge status: Home / Self Care | Payer: Medicare HMO | Source: Ambulatory Visit | Attending: Gastroenterology | Admitting: Gastroenterology

## 2014-11-07 DIAGNOSIS — N183 Chronic kidney disease, stage 3 (moderate): Secondary | ICD-10-CM | POA: Insufficient documentation

## 2014-11-07 DIAGNOSIS — D6489 Other specified anemias: Secondary | ICD-10-CM | POA: Diagnosis not present

## 2014-11-07 LAB — IRON AND TIBC
Iron: 75 ug/dL (ref 45–182)
Saturation Ratios: 36 % (ref 17.9–39.5)
TIBC: 210 ug/dL — AB (ref 250–450)
UIBC: 135 ug/dL

## 2014-11-07 LAB — FERRITIN: FERRITIN: 701 ng/mL — AB (ref 24–336)

## 2014-11-07 LAB — HEMOGLOBIN: HEMOGLOBIN: 10.8 g/dL — AB (ref 13.0–17.0)

## 2014-11-07 MED ORDER — CLONIDINE HCL 0.1 MG PO TABS
0.1000 mg | ORAL_TABLET | Freq: Once | ORAL | Status: DC
  Filled 2014-11-07: qty 1

## 2014-11-07 MED ORDER — EPOETIN ALFA 10000 UNIT/ML IJ SOLN
10000.0000 [IU] | INTRAMUSCULAR | Status: DC
  Administered 2014-11-07: 10000 [IU] via SUBCUTANEOUS
  Filled 2014-11-07: qty 1

## 2014-11-13 ENCOUNTER — Encounter: Payer: Self-pay | Admitting: Internal Medicine

## 2014-11-13 ENCOUNTER — Ambulatory Visit (INDEPENDENT_AMBULATORY_CARE_PROVIDER_SITE_OTHER): Payer: Medicare HMO | Admitting: Internal Medicine

## 2014-11-13 VITALS — BP 102/66 | HR 82 | Temp 98.1°F | Resp 20 | Ht 76.0 in | Wt 179.4 lb

## 2014-11-13 DIAGNOSIS — E785 Hyperlipidemia, unspecified: Secondary | ICD-10-CM

## 2014-11-13 DIAGNOSIS — Z23 Encounter for immunization: Secondary | ICD-10-CM | POA: Diagnosis not present

## 2014-11-13 DIAGNOSIS — K50111 Crohn's disease of large intestine with rectal bleeding: Secondary | ICD-10-CM

## 2014-11-13 DIAGNOSIS — R609 Edema, unspecified: Secondary | ICD-10-CM

## 2014-11-13 DIAGNOSIS — N183 Chronic kidney disease, stage 3 unspecified: Secondary | ICD-10-CM

## 2014-11-13 DIAGNOSIS — R42 Dizziness and giddiness: Secondary | ICD-10-CM | POA: Diagnosis not present

## 2014-11-13 DIAGNOSIS — I10 Essential (primary) hypertension: Secondary | ICD-10-CM | POA: Diagnosis not present

## 2014-11-13 DIAGNOSIS — D509 Iron deficiency anemia, unspecified: Secondary | ICD-10-CM

## 2014-11-13 DIAGNOSIS — N400 Enlarged prostate without lower urinary tract symptoms: Secondary | ICD-10-CM

## 2014-11-13 NOTE — Addendum Note (Signed)
Addended by: Logan Bores on: 11/13/2014 11:12 AM   Modules accepted: Orders

## 2014-11-13 NOTE — Progress Notes (Signed)
Patient ID: Brent [name], male   DOB: [birth year], 69 y.o.   MRN: [id number]   Location:  [facility]  Goals of Care: Advanced Directive information Does patient have an advance directive?: No, Would patient like information on creating an advanced directive?: Yes - Educational materials given   Chief Complaint  Patient presents with  . Medical Management of Chronic Issues    3 month follow-up for Crohns, Hypertension  . Immunizations    Will take flu shot today  . OTHER    Discuss Advanced Directive    HPI: Patient is a 69 y.o. black male seen in the office today for med mgt of chronic diseases.    BP running low.  Has already reduced amlodipine.  Has been dizzy and lightheaded.  Is improved some.  Very seldom dizzy now since on 69m instead of 124m  Mysterious as to why he's been low now.    Agrees with flu shot.  Says he hasn't even gotten a cold in years.  Last time, he did need the procrit injection.  Continues on iron supplement also.  Doing fine with ditropan xl.  Has not seen Dr. PaPosey Prontoor a long time.  Also on flomax.    Acid relflux:  Only notices if he lays down. Is taking prilosec.  Doesn't bother him much.  Has been in good spirits.  Continues on hs zyprexa.    Still having difficulty with loose stools.  Has had to take more loperamide than he used to.  Appetite not as good in the heat.   [facility].   Still off cigarettes--quit in feb 2016.  Review of Systems:  Review of Systems  Constitutional: Negative for fever and chills.       Weight gain  HENT: Negative for congestion.   Eyes:       Glasses  Respiratory: Negative for shortness of breath.   Cardiovascular: Negative for chest pain and leg swelling.  Gastrointestinal: Positive for diarrhea. Negative for abdominal pain, constipation, blood in stool and melena.  Genitourinary: Negative for dysuria, urgency and  frequency.  Musculoskeletal: Negative for falls.  Skin: Negative for rash.  Neurological: Positive for dizziness. Negative for loss of consciousness, weakness and headaches.  Psychiatric/Behavioral: Negative for depression and memory loss.       Mood good, on zyprexa    Past Medical History  Diagnosis Date  . Crohn's   . Anxiety   . Depression   . Renal insufficiency   . Anemia   . Edema   . Other abnormal blood chemistry   . Malnutrition of mild degree   . Acute cholecystitis   . Abdominal pain, unspecified site   . Viremia, unspecified   . Pain in joint, lower leg   . Loss of weight   . Regional enteritis of small intestine   . Benign paroxysmal positional vertigo   . Other B-complex deficiencies   . Hypertensive renal disease, benign   . Other B-complex deficiencies   . Anemia, unspecified   . Other specified disease of white blood cells   . Chronic kidney disease, stage III (moderate)   . Unspecified disorder of skin and subcutaneous tissue   . Tobacco use disorder   . Celiac disease   . Essential and other specified forms of tremor   . Abdominal pain, right lower quadrant   . Hypertrophy of prostate without urinary obstruction  and other lower urinary tract symptoms (LUTS)   . Dysuria   . Dizziness and giddiness   . Encounter for long-term (current) use of other medications   . Iron deficiency anemia, unspecified   . Essential hypertension, benign   . Other extrapyramidal disease and abnormal movement disorder   . Pain in limb   . Other extrapyramidal disease and abnormal movement disorder   . Insomnia with sleep apnea, unspecified   . Insomnia with sleep apnea, unspecified   . Other specified disease of white blood cells   . Narcolepsy without cataplexy   . Regional enteritis of small intestine with large intestine   . Hypopotassemia   . Nausea with vomiting   . Viral warts, unspecified   . Impotence of organic origin   . Cough   . Postinflammatory  pulmonary fibrosis   . Cellulitis and abscess of unspecified site   . Dizziness and giddiness   . Dizziness and giddiness   . Other malaise and fatigue   . Diarrhea   . Bipolar I disorder, most recent episode (or current) unspecified   . Impacted cerumen   . Neuralgia, neuritis, and radiculitis, unspecified     Past Surgical History  Procedure Laterality Date  . Small intestine surgery      x 2  . Cholecystectomy  07-12-2010    Allergies  Allergen Reactions  . Azathioprine     REACTION: affected WBC  . Ciprofloxacin     Pt denies any reaction  . Levaquin [Levofloxacin In D5w]     Pt denies any reaction  . Plendil [Felodipine]     Pt denies any reaction   Medications: Patient's Medications  New Prescriptions   No medications on file  Previous Medications   ALBUTEROL (PROVENTIL HFA;VENTOLIN HFA) 108 (90 BASE) MCG/ACT INHALER    Inhale 2 puffs TID x 7 days then BID x 7 days then daily x 7 days and stop. Rinse mouth after each use   AMLODIPINE (NORVASC) 10 MG TABLET    Take 5 mg by mouth Nightly.    CALCIUM CARBONATE-VIT D-MIN 600-400 MG-UNIT TABS    Take 1 tablet by mouth 2 (two) times daily.     CHOLESTYRAMINE (QUESTRAN) 4 GM/DOSE POWDER    TAKE 1 SCOOPFUL DISSOLVED IN WATER ONCE DAILY.   DIPHENHYDRAMINE (BENADRYL) 25 MG CAPSULE    Take 1-2 capsules (25-50 mg total) by mouth once. Prior to infusion   DIVALPROEX (DEPAKOTE) 500 MG EC TABLET    Take 1,500 mg by mouth daily.    EPOETIN ALFA (PROCRIT) 17793 UNIT/ML INJECTION    Inject 10,000 Units into the skin every 14 (fourteen) days.    FERROUS SULFATE 325 (65 FE) MG TABLET    Take 325 mg by mouth 2 (two) times daily.     INFLIXIMAB (REMICADE) 100 MG INJECTION    Infuse Remicade IV schedule 1 23m/kg every 8 weeks Premedicate with Tylenol 500-6593mby mouth and Benadryl 25-5046my mouth prior to infusion. Last PPD was on 12/2009.    LOPERAMIDE HCL (LOPERAMIDE A-D PO)    Take 1 capsule by mouth as needed.     MAGNESIUM OXIDE  (MAG-OX) 400 MG TABLET    Take 400 mg by mouth 2 (two) times daily.    METOPROLOL SUCCINATE (TOPROL-XL) 25 MG 24 HR TABLET    Take 25 mg by mouth daily.     MULTIPLE VITAMIN (MULTIVITAMIN) TABLET    Take 1 tablet by mouth daily.  MYRBETRIQ 50 MG TB24 TABLET    Take 50 mg by mouth daily.   OLANZAPINE (ZYPREXA) 10 MG TABLET    Take 10 mg by mouth at bedtime.     OMEPRAZOLE (PRILOSEC) 20 MG CAPSULE    Take 20 mg by mouth daily.     OXYBUTYNIN (DITROPAN XL) 15 MG 24 HR TABLET    Take 15 mg by mouth daily.     TAMSULOSIN HCL (FLOMAX) 0.4 MG CAPS    Take 0.4 mg by mouth daily.   VITAMIN E 400 UNIT CAPSULE    Take 400 Units by mouth daily.     ZOSTER VACCINE LIVE, PF, (ZOSTAVAX) 69678 UNT/0.65ML INJECTION    Inject 19,400 Units into the skin once.  Modified Medications   No medications on file  Discontinued Medications   No medications on file    Physical Exam: Filed Vitals:   11/13/14 1009  BP: 102/66  Pulse: 82  Temp: 98.1 F (36.7 C)  TempSrc: Oral  Resp: 20  Height: 6' 4"  (1.93 m)  Weight: 179 lb 6.4 oz (81.375 kg)  SpO2: 95%   Physical Exam  Constitutional: He is oriented to person, place, and time. He appears well-developed and well-nourished. No distress.  Eyes:  glasses  Cardiovascular: Normal rate, regular rhythm, normal heart sounds and intact distal pulses.   Pulmonary/Chest: Effort normal and breath sounds normal.  Abdominal: Soft. Bowel sounds are normal. He exhibits no distension. There is no tenderness.  Musculoskeletal: Normal range of motion.  Neurological: He is alert and oriented to person, place, and time.  Skin: Skin is warm and dry.  Psychiatric: He has a normal mood and affect.    Labs reviewed: Basic Metabolic Panel:  Recent Labs  02/08/14 0843 07/21/14 0948 08/08/14 1032  NA 144 141 141  K 4.1 3.9 3.9  CL 107 101 103  CO2 24 24 21   GLUCOSE 106* 84 134*  BUN 18 23 20   CREATININE 1.83* 1.65* 1.72*  CALCIUM 8.9 8.6 8.8  TSH 1.480  --   --      Liver Function Tests:  Recent Labs  02/08/14 0843 07/21/14 0948 08/08/14 1032  AST 19 17 20   ALT 19 35 23  ALKPHOS 76 91 90  BILITOT 0.4 0.4 0.4  PROT 6.4 6.3 6.3   No results for input(s): LIPASE, AMYLASE in the last 8760 hours. No results for input(s): AMMONIA in the last 8760 hours. CBC:  Recent Labs  02/08/14 0843  09/12/14 1350 10/10/14 1045 11/07/14 1110  WBC 9.6  --   --   --   --   NEUTROABS 5.5  --   --   --   --   HGB 11.9*  < > 11.6* 10.7* 10.8*  HCT 35.3*  --   --   --   --   MCV 100*  --   --   --   --   < > = values in this interval not displayed. Lipid Panel:  Recent Labs  02/08/14 0843 07/21/14 0948 08/08/14 1032  CHOL 139 118 124  HDL 40 39* 37*  LDLCALC 74 59 55  TRIG 127 101 159*  CHOLHDL 3.5 3.0 3.4   No results found for: HGBA1C  Assessment/Plan 1. Dizzy spells -due to hypotension it seems -did not resolve when cerumen was cleaned out of ears as I'd hoped -is on amlodipine 22m now, but will discontinue altogether due to ongoing intermittent symptoms -if persists, may be oxybutynin related, but  myrbetriq was too expensive  2. Edema - resolved with less amlodipine - Comprehensive metabolic panel; Future  3. Chronic kidney disease (CKD), stage III (moderate) - renal function stable, follows with Dr. Posey Pronto -anemia related to this and iron deficiency - Comprehensive metabolic panel; Future  4. Crohn's colitis, with rectal bleeding - has been having more difficulty with loose stools and not eating properly lately so has required more loperamide, albumin down - Comprehensive metabolic panel; Future  5. Essential hypertension, benign - bp has been low so will stop amlodipine - Comprehensive metabolic panel; Future  6. BPH (benign prostatic hyperplasia) -cont flomax and oxybutynin for now--may need to d/c one of these if his dizziness persists - Comprehensive metabolic panel; Future  7. Anemia, iron deficiency -cont iron  supplement and prn procrit  - CBC with Differential/Platelet; Future  8. Hyperlipidemia - lipids at goal but TG elevated now--discussed need to increase healthy veggies and decrease starches - Lipid panel; Future - Hemoglobin A1c; Future  9. Influenza vaccine needed -flu shot given today  Labs/tests ordered: Orders Placed This Encounter  Procedures  . CBC with Differential/Platelet    Standing Status: Future     Number of Occurrences:      Standing Expiration Date: 11/13/2015  . Comprehensive metabolic panel    Standing Status: Future     Number of Occurrences:      Standing Expiration Date: 11/13/2015    Order Specific Question:  Has the patient fasted?    Answer:  Yes  . Lipid panel    Standing Status: Future     Number of Occurrences:      Standing Expiration Date: 11/13/2015    Order Specific Question:  Has the patient fasted?    Answer:  Yes  . Hemoglobin A1c    Standing Status: Future     Number of Occurrences:      Standing Expiration Date: 11/13/2015   Next appt: 4 mos for annual exam with labs before  [place]. Weaver Tweed, D.O. [facility] [address], [place] [address] Cell Phone (Mon-Fri 8am-5pm):  [phone] On Call:  [phone] & follow prompts after 5pm & weekends Office Phone:  [phone] Office Fax:  [phone]

## 2014-11-28 ENCOUNTER — Encounter (HOSPITAL_COMMUNITY): Payer: Medicare HMO

## 2014-12-05 ENCOUNTER — Encounter (HOSPITAL_COMMUNITY): Payer: Self-pay

## 2014-12-05 ENCOUNTER — Encounter (HOSPITAL_COMMUNITY)
Admission: RE | Admit: 2014-12-05 | Discharge: 2014-12-05 | Disposition: A | Discharge status: Home / Self Care | Payer: Medicare HMO | Source: Ambulatory Visit | Attending: Gastroenterology | Admitting: Gastroenterology

## 2014-12-05 DIAGNOSIS — N183 Chronic kidney disease, stage 3 (moderate): Secondary | ICD-10-CM | POA: Diagnosis not present

## 2014-12-05 DIAGNOSIS — D6489 Other specified anemias: Secondary | ICD-10-CM | POA: Diagnosis not present

## 2014-12-05 LAB — IRON AND TIBC
IRON: 111 ug/dL (ref 45–182)
Saturation Ratios: 47 % — ABNORMAL HIGH (ref 17.9–39.5)
TIBC: 234 ug/dL — AB (ref 250–450)
UIBC: 123 ug/dL

## 2014-12-05 LAB — FERRITIN: Ferritin: 1439 ng/mL — ABNORMAL HIGH (ref 24–336)

## 2014-12-05 LAB — HEMOGLOBIN: HEMOGLOBIN: 11.6 g/dL — AB (ref 13.0–17.0)

## 2014-12-05 NOTE — Discharge Instructions (Signed)
Epoetin Alfa injection What is this medicine? EPOETIN ALFA (e POE e tin AL fa) helps your body make more red blood cells. This medicine is used to treat anemia caused by chronic kidney failure, cancer chemotherapy, or HIV-therapy. It may also be used before surgery if you have anemia. This medicine may be used for other purposes; ask your health care provider or pharmacist if you have questions. What should I tell my health care provider before I take this medicine? They need to know if you have any of these conditions: -blood clotting disorders -cancer patient not on chemotherapy -cystic fibrosis -heart disease, such as angina or heart failure -hemoglobin level of 12 g/dL or greater -high blood pressure -low levels of folate, iron, or vitamin B12 -seizures -an unusual or allergic reaction to erythropoietin, albumin, benzyl alcohol, hamster proteins, other medicines, foods, dyes, or preservatives -pregnant or trying to get pregnant -breast-feeding How should I use this medicine? This medicine is for injection into a vein or under the skin. It is usually given by a health care professional in a hospital or clinic setting. If you get this medicine at home, you will be taught how to prepare and give this medicine. Use exactly as directed. Take your medicine at regular intervals. Do not take your medicine more often than directed. It is important that you put your used needles and syringes in a special sharps container. Do not put them in a trash can. If you do not have a sharps container, call your pharmacist or healthcare provider to get one. Talk to your pediatrician regarding the use of this medicine in children. While this drug may be prescribed for selected conditions, precautions do apply. Overdosage: If you think you have taken too much of this medicine contact a poison control center or emergency room at once. NOTE: This medicine is only for you. Do not share this medicine with  others. What if I miss a dose? If you miss a dose, take it as soon as you can. If it is almost time for your next dose, take only that dose. Do not take double or extra doses. What may interact with this medicine? Do not take this medicine with any of the following medications: -darbepoetin alfa This list may not describe all possible interactions. Give your health care provider a list of all the medicines, herbs, non-prescription drugs, or dietary supplements you use. Also tell them if you smoke, drink alcohol, or use illegal drugs. Some items may interact with your medicine. What should I watch for while using this medicine? Visit your prescriber or health care professional for regular checks on your progress and for the needed blood tests and blood pressure measurements. It is especially important for the doctor to make sure your hemoglobin level is in the desired range, to limit the risk of potential side effects and to give you the best benefit. Keep all appointments for any recommended tests. Check your blood pressure as directed. Ask your doctor what your blood pressure should be and when you should contact him or her. As your body makes more red blood cells, you may need to take iron, folic acid, or vitamin B supplements. Ask your doctor or health care provider which products are right for you. If you have kidney disease continue dietary restrictions, even though this medication can make you feel better. Talk with your doctor or health care professional about the foods you eat and the vitamins that you take. What side effects may I notice   from receiving this medicine? Side effects that you should report to your doctor or health care professional as soon as possible: -allergic reactions like skin rash, itching or hives, swelling of the face, lips, or tongue -breathing problems -changes in vision -chest pain -confusion, trouble speaking or understanding -feeling faint or lightheaded,  falls -high blood pressure -muscle aches or pains -pain, swelling, warmth in the leg -rapid weight gain -severe headaches -sudden numbness or weakness of the face, arm or leg -trouble walking, dizziness, loss of balance or coordination -seizures (convulsions) -swelling of the ankles, feet, hands -unusually weak or tired Side effects that usually do not require medical attention (report to your doctor or health care professional if they continue or are bothersome): -diarrhea -fever, chills (flu-like symptoms) -headaches -nausea, vomiting -redness, stinging, or swelling at site where injected This list may not describe all possible side effects. Call your doctor for medical advice about side effects. You may report side effects to FDA at 1-800-FDA-1088. Where should I keep my medicine? Keep out of the reach of children. Store in a refrigerator between 2 and 8 degrees C (36 and 46 degrees F). Do not freeze or shake. Throw away any unused portion if using a single-dose vial. Multi-dose vials can be kept in the refrigerator for up to 21 days after the initial dose. Throw away unused medicine. NOTE: This sheet is a summary. It may not cover all possible information. If you have questions about this medicine, talk to your doctor, pharmacist, or health care provider.    2016, [employer]. (2008-01-25 10:25:44)  

## 2014-12-26 ENCOUNTER — Encounter (HOSPITAL_COMMUNITY): Payer: Medicare HMO

## 2015-01-02 ENCOUNTER — Encounter (HOSPITAL_COMMUNITY): Payer: Self-pay

## 2015-01-02 ENCOUNTER — Encounter (HOSPITAL_COMMUNITY)
Admission: RE | Admit: 2015-01-02 | Discharge: 2015-01-02 | Disposition: A | Discharge status: Home / Self Care | Payer: Medicare HMO | Source: Ambulatory Visit | Attending: Gastroenterology | Admitting: Gastroenterology

## 2015-01-02 DIAGNOSIS — N183 Chronic kidney disease, stage 3 (moderate): Secondary | ICD-10-CM | POA: Insufficient documentation

## 2015-01-02 DIAGNOSIS — D6489 Other specified anemias: Secondary | ICD-10-CM | POA: Diagnosis not present

## 2015-01-02 LAB — FERRITIN: FERRITIN: 873 ng/mL — AB (ref 24–336)

## 2015-01-02 LAB — IRON AND TIBC
Iron: 45 ug/dL (ref 45–182)
SATURATION RATIOS: 22 % (ref 17.9–39.5)
TIBC: 204 ug/dL — AB (ref 250–450)
UIBC: 159 ug/dL

## 2015-01-02 LAB — HEMOGLOBIN: HEMOGLOBIN: 10.9 g/dL — AB (ref 13.0–17.0)

## 2015-01-02 MED ORDER — EPOETIN ALFA 10000 UNIT/ML IJ SOLN
10000.0000 [IU] | INTRAMUSCULAR | Status: DC
  Administered 2015-01-02: 10000 [IU] via SUBCUTANEOUS
  Filled 2015-01-02: qty 1

## 2015-01-23 ENCOUNTER — Encounter (HOSPITAL_COMMUNITY): Payer: Medicare HMO

## 2015-01-30 ENCOUNTER — Encounter (HOSPITAL_COMMUNITY)
Admission: RE | Admit: 2015-01-30 | Discharge: 2015-01-30 | Disposition: A | Discharge status: Home / Self Care | Payer: Medicare HMO | Source: Ambulatory Visit | Attending: Gastroenterology | Admitting: Gastroenterology

## 2015-01-30 DIAGNOSIS — N183 Chronic kidney disease, stage 3 (moderate): Secondary | ICD-10-CM | POA: Diagnosis not present

## 2015-01-30 DIAGNOSIS — D6489 Other specified anemias: Secondary | ICD-10-CM | POA: Insufficient documentation

## 2015-01-30 LAB — IRON AND TIBC
IRON: 76 ug/dL (ref 45–182)
SATURATION RATIOS: 35 % (ref 17.9–39.5)
TIBC: 220 ug/dL — ABNORMAL LOW (ref 250–450)
UIBC: 144 ug/dL

## 2015-01-30 LAB — HEMOGLOBIN: Hemoglobin: 11.1 g/dL — ABNORMAL LOW (ref 13.0–17.0)

## 2015-01-30 LAB — FERRITIN: FERRITIN: 854 ng/mL — AB (ref 24–336)

## 2015-01-30 MED ORDER — EPOETIN ALFA 10000 UNIT/ML IJ SOLN
10000.0000 [IU] | INTRAMUSCULAR | Status: DC
  Administered 2015-01-30: 10000 [IU] via SUBCUTANEOUS
  Filled 2015-01-30: qty 1

## 2015-01-30 MED ORDER — CLONIDINE HCL 0.1 MG PO TABS
0.1000 mg | ORAL_TABLET | Freq: Once | ORAL | Status: DC
  Filled 2015-01-30: qty 1

## 2015-02-20 ENCOUNTER — Encounter (HOSPITAL_COMMUNITY): Payer: Medicare HMO

## 2015-02-27 ENCOUNTER — Encounter (HOSPITAL_COMMUNITY)
Admission: RE | Admit: 2015-02-27 | Discharge: 2015-02-27 | Disposition: A | Discharge status: Home / Self Care | Payer: Medicare HMO | Source: Ambulatory Visit | Attending: Gastroenterology | Admitting: Gastroenterology

## 2015-02-27 ENCOUNTER — Encounter (HOSPITAL_COMMUNITY): Payer: Self-pay

## 2015-02-27 DIAGNOSIS — D6489 Other specified anemias: Secondary | ICD-10-CM | POA: Diagnosis not present

## 2015-02-27 DIAGNOSIS — N183 Chronic kidney disease, stage 3 (moderate): Secondary | ICD-10-CM | POA: Diagnosis not present

## 2015-02-27 LAB — FERRITIN: Ferritin: 1102 ng/mL — ABNORMAL HIGH (ref 24–336)

## 2015-02-27 LAB — IRON AND TIBC
IRON: 30 ug/dL — AB (ref 45–182)
SATURATION RATIOS: 14 % — AB (ref 17.9–39.5)
TIBC: 214 ug/dL — ABNORMAL LOW (ref 250–450)
UIBC: 184 ug/dL

## 2015-02-27 LAB — HEMOGLOBIN: Hemoglobin: 11.8 g/dL — ABNORMAL LOW (ref 13.0–17.0)

## 2015-02-27 NOTE — Progress Notes (Signed)
Hgb 11.8 and does not meet criteria for receiving Procrit today. Pt states he is being evaluated [facility] and his PCP for "dizziness'. His BP medication has been reduced and he has had his ears evaluated. Pt ambulated steady with not dizziness . Encouraged him to contact his PCP to inform her that the dizziness continues

## 2015-03-20 ENCOUNTER — Encounter (HOSPITAL_COMMUNITY)
Admission: RE | Admit: 2015-03-20 | Discharge: 2015-03-20 | Disposition: A | Discharge status: Home / Self Care | Payer: Medicare HMO | Source: Ambulatory Visit | Attending: Nephrology | Admitting: Nephrology

## 2015-03-20 ENCOUNTER — Encounter (HOSPITAL_COMMUNITY): Payer: Self-pay

## 2015-03-20 DIAGNOSIS — N183 Chronic kidney disease, stage 3 (moderate): Secondary | ICD-10-CM | POA: Diagnosis not present

## 2015-03-20 MED ORDER — SODIUM CHLORIDE 0.9 % IV SOLN: INTRAVENOUS | Status: DC

## 2015-03-20 MED ORDER — FERUMOXYTOL INJECTION 510 MG/17 ML
510.0000 mg | INTRAVENOUS | Status: DC
  Administered 2015-03-20: 510 mg via INTRAVENOUS
  Filled 2015-03-20: qty 17

## 2015-03-20 MED ORDER — SODIUM CHLORIDE 0.9 % IV SOLN
Freq: Once | INTRAVENOUS | Status: AC
  Administered 2015-03-20: 11:00:00 via INTRAVENOUS

## 2015-03-27 ENCOUNTER — Encounter (HOSPITAL_COMMUNITY)
Admission: RE | Admit: 2015-03-27 | Discharge: 2015-03-27 | Disposition: A | Discharge status: Home / Self Care | Payer: Medicare HMO | Source: Ambulatory Visit | Attending: Nephrology | Admitting: Nephrology

## 2015-03-27 DIAGNOSIS — N183 Chronic kidney disease, stage 3 (moderate): Secondary | ICD-10-CM | POA: Diagnosis not present

## 2015-03-27 LAB — CBC
HCT: 34.5 % — ABNORMAL LOW (ref 39.0–52.0)
Hemoglobin: 11 g/dL — ABNORMAL LOW (ref 13.0–17.0)
MCH: 33.1 pg (ref 26.0–34.0)
MCHC: 31.9 g/dL (ref 30.0–36.0)
MCV: 103.9 fL — ABNORMAL HIGH (ref 78.0–100.0)
Platelets: 167 10*3/uL (ref 150–400)
RBC: 3.32 MIL/uL — ABNORMAL LOW (ref 4.22–5.81)
RDW: 13 % (ref 11.5–15.5)
WBC: 8.2 10*3/uL (ref 4.0–10.5)

## 2015-03-27 LAB — FERRITIN: FERRITIN: 1832 ng/mL — AB (ref 24–336)

## 2015-03-27 LAB — RENAL FUNCTION PANEL
Albumin: 3.4 g/dL — ABNORMAL LOW (ref 3.5–5.0)
Anion gap: 7 (ref 5–15)
BUN: 28 mg/dL — ABNORMAL HIGH (ref 6–20)
CALCIUM: 8.8 mg/dL — AB (ref 8.9–10.3)
CHLORIDE: 106 mmol/L (ref 101–111)
CO2: 27 mmol/L (ref 22–32)
CREATININE: 1.93 mg/dL — AB (ref 0.61–1.24)
GFR calc Af Amer: 39 mL/min — ABNORMAL LOW (ref >60)
GFR calc non Af Amer: 34 mL/min — ABNORMAL LOW (ref >60)
GLUCOSE: 102 mg/dL — AB (ref 65–99)
Phosphorus: 2.1 mg/dL — ABNORMAL LOW (ref 2.5–4.6)
Potassium: 4.7 mmol/L (ref 3.5–5.1)
SODIUM: 140 mmol/L (ref 135–145)

## 2015-03-27 LAB — IRON AND TIBC
IRON: 112 ug/dL (ref 45–182)
SATURATION RATIOS: 51 % — AB (ref 17.9–39.5)
TIBC: 221 ug/dL — AB (ref 250–450)
UIBC: 109 ug/dL

## 2015-03-27 LAB — MAGNESIUM: MAGNESIUM: 1.2 mg/dL — AB (ref 1.7–2.4)

## 2015-03-27 MED ORDER — EPOETIN ALFA 10000 UNIT/ML IJ SOLN
10000.0000 [IU] | INTRAMUSCULAR | Status: DC
  Administered 2015-03-27: 10000 [IU] via SUBCUTANEOUS
  Filled 2015-03-27: qty 1

## 2015-03-27 MED ORDER — FERUMOXYTOL INJECTION 510 MG/17 ML
510.0000 mg | INTRAVENOUS | Status: AC
  Administered 2015-03-27: 510 mg via INTRAVENOUS
  Filled 2015-03-27: qty 17

## 2015-03-27 MED ORDER — SODIUM CHLORIDE 0.9 % IV SOLN
INTRAVENOUS | Status: DC
  Administered 2015-03-27: 14:00:00 via INTRAVENOUS

## 2015-03-27 NOTE — Progress Notes (Signed)
Uneventful infusion od Feraheme #2/2 of this series. Hgb 11 today and Procrit given as ordered

## 2015-03-28 LAB — VITAMIN D 25 HYDROXY (VIT D DEFICIENCY, FRACTURES): VIT D 25 HYDROXY: 32.5 ng/mL (ref 30.0–100.0)

## 2015-03-28 LAB — PTH, INTACT AND CALCIUM
Calcium, Total (PTH): 8.3 mg/dL — ABNORMAL LOW (ref 8.6–10.2)
PTH: 31 pg/mL (ref 15–65)

## 2015-04-24 ENCOUNTER — Encounter (HOSPITAL_COMMUNITY)
Admission: RE | Admit: 2015-04-24 | Discharge: 2015-04-24 | Disposition: A | Discharge status: Home / Self Care | Payer: Medicare HMO | Source: Ambulatory Visit | Attending: Gastroenterology | Admitting: Gastroenterology

## 2015-04-24 ENCOUNTER — Encounter (HOSPITAL_COMMUNITY): Payer: Self-pay

## 2015-04-24 DIAGNOSIS — D6489 Other specified anemias: Secondary | ICD-10-CM | POA: Diagnosis not present

## 2015-04-24 DIAGNOSIS — N183 Chronic kidney disease, stage 3 (moderate): Secondary | ICD-10-CM | POA: Diagnosis not present

## 2015-04-24 LAB — IRON AND TIBC
IRON: 23 ug/dL — AB (ref 45–182)
Saturation Ratios: 12 % — ABNORMAL LOW (ref 17.9–39.5)
TIBC: 196 ug/dL — AB (ref 250–450)
UIBC: 173 ug/dL

## 2015-04-24 LAB — HEMOGLOBIN: Hemoglobin: 11.4 g/dL — ABNORMAL LOW (ref 13.0–17.0)

## 2015-04-24 LAB — FERRITIN: FERRITIN: 3552 ng/mL — AB (ref 24–336)

## 2015-04-24 MED ORDER — EPOETIN ALFA 10000 UNIT/ML IJ SOLN
10000.0000 [IU] | INTRAMUSCULAR | Status: DC
  Administered 2015-04-24: 10000 [IU] via SUBCUTANEOUS
  Filled 2015-04-24: qty 1

## 2015-05-02 ENCOUNTER — Encounter (HOSPITAL_COMMUNITY): Payer: Self-pay | Admitting: Emergency Medicine

## 2015-05-02 DIAGNOSIS — Z872 Personal history of diseases of the skin and subcutaneous tissue: Secondary | ICD-10-CM | POA: Diagnosis not present

## 2015-05-02 DIAGNOSIS — M545 Low back pain: Secondary | ICD-10-CM | POA: Insufficient documentation

## 2015-05-02 DIAGNOSIS — N183 Chronic kidney disease, stage 3 (moderate): Secondary | ICD-10-CM | POA: Insufficient documentation

## 2015-05-02 DIAGNOSIS — D649 Anemia, unspecified: Secondary | ICD-10-CM | POA: Diagnosis not present

## 2015-05-02 DIAGNOSIS — Z79899 Other long term (current) drug therapy: Secondary | ICD-10-CM | POA: Diagnosis not present

## 2015-05-02 DIAGNOSIS — F319 Bipolar disorder, unspecified: Secondary | ICD-10-CM | POA: Insufficient documentation

## 2015-05-02 DIAGNOSIS — G4700 Insomnia, unspecified: Secondary | ICD-10-CM | POA: Diagnosis not present

## 2015-05-02 DIAGNOSIS — Z87891 Personal history of nicotine dependence: Secondary | ICD-10-CM | POA: Diagnosis not present

## 2015-05-02 DIAGNOSIS — Z8639 Personal history of other endocrine, nutritional and metabolic disease: Secondary | ICD-10-CM | POA: Diagnosis not present

## 2015-05-02 DIAGNOSIS — F419 Anxiety disorder, unspecified: Secondary | ICD-10-CM | POA: Insufficient documentation

## 2015-05-02 DIAGNOSIS — M549 Dorsalgia, unspecified: Secondary | ICD-10-CM | POA: Diagnosis present

## 2015-05-02 DIAGNOSIS — I129 Hypertensive chronic kidney disease with stage 1 through stage 4 chronic kidney disease, or unspecified chronic kidney disease: Secondary | ICD-10-CM | POA: Diagnosis not present
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
## 2015-05-03 ENCOUNTER — Emergency Department (HOSPITAL_COMMUNITY)
Admission: EM | Admit: 2015-05-03 | Discharge: 2015-05-03 | Disposition: A | Discharge status: Home / Self Care | Payer: Medicare HMO | Attending: Emergency Medicine | Admitting: Emergency Medicine

## 2015-05-03 ENCOUNTER — Emergency Department (HOSPITAL_COMMUNITY): Payer: Medicare HMO

## 2015-05-03 DIAGNOSIS — M545 Low back pain, unspecified: Secondary | ICD-10-CM

## 2015-05-03 LAB — CBC WITH DIFFERENTIAL/PLATELET
BASOS PCT: 0 %
Basophils Absolute: 0 10*3/uL (ref 0.0–0.1)
EOS ABS: 0 10*3/uL (ref 0.0–0.7)
EOS PCT: 0 %
HCT: 36.2 % — ABNORMAL LOW (ref 39.0–52.0)
HEMOGLOBIN: 11.8 g/dL — AB (ref 13.0–17.0)
LYMPHS ABS: 1.7 10*3/uL (ref 0.7–4.0)
Lymphocytes Relative: 19 %
MCH: 33.9 pg (ref 26.0–34.0)
MCHC: 32.6 g/dL (ref 30.0–36.0)
MCV: 104 fL — ABNORMAL HIGH (ref 78.0–100.0)
MONOS PCT: 4 %
Monocytes Absolute: 0.4 10*3/uL (ref 0.1–1.0)
NEUTROS ABS: 6.7 10*3/uL (ref 1.7–7.7)
NEUTROS PCT: 77 %
PLATELETS: 250 10*3/uL (ref 150–400)
RBC: 3.48 MIL/uL — AB (ref 4.22–5.81)
RDW: 13.9 % (ref 11.5–15.5)
WBC: 8.7 10*3/uL (ref 4.0–10.5)

## 2015-05-03 LAB — URINALYSIS, ROUTINE W REFLEX MICROSCOPIC
Glucose, UA: NEGATIVE mg/dL
HGB URINE DIPSTICK: NEGATIVE
Ketones, ur: NEGATIVE mg/dL
Leukocytes, UA: NEGATIVE
NITRITE: NEGATIVE
PROTEIN: 30 mg/dL — AB
Specific Gravity, Urine: 1.03 (ref 1.005–1.030)
pH: 6 (ref 5.0–8.0)

## 2015-05-03 LAB — URINE MICROSCOPIC-ADD ON
BACTERIA UA: NONE SEEN
RBC / HPF: NONE SEEN RBC/hpf (ref 0–5)

## 2015-05-03 LAB — I-STAT CHEM 8, ED
BUN: 20 mg/dL (ref 6–20)
CHLORIDE: 106 mmol/L (ref 101–111)
Calcium, Ion: 1.1 mmol/L — ABNORMAL LOW (ref 1.13–1.30)
Creatinine, Ser: 1.5 mg/dL — ABNORMAL HIGH (ref 0.61–1.24)
Glucose, Bld: 103 mg/dL — ABNORMAL HIGH (ref 65–99)
HEMATOCRIT: 41 % (ref 39.0–52.0)
Hemoglobin: 13.9 g/dL (ref 13.0–17.0)
Potassium: 3.5 mmol/L (ref 3.5–5.1)
SODIUM: 144 mmol/L (ref 135–145)
TCO2: 22 mmol/L (ref 0–100)

## 2015-05-03 MED ORDER — SODIUM CHLORIDE 0.9 % IV BOLUS (SEPSIS)
1000.0000 mL | Freq: Once | INTRAVENOUS | Status: AC
  Administered 2015-05-03: 1000 mL via INTRAVENOUS

## 2015-05-03 MED ORDER — DIAZEPAM 2 MG PO TABS
2.0000 mg | ORAL_TABLET | Freq: Once | ORAL | Status: AC
  Administered 2015-05-03: 2 mg via ORAL
  Filled 2015-05-03: qty 1

## 2015-05-03 MED ORDER — TRAMADOL HCL 50 MG PO TABS: 50.0000 mg | ORAL_TABLET | Freq: Two times a day (BID) | ORAL | Status: DC | PRN

## 2015-05-03 MED ORDER — MORPHINE SULFATE (PF) 4 MG/ML IV SOLN
4.0000 mg | Freq: Once | INTRAVENOUS | Status: AC
  Administered 2015-05-03: 4 mg via INTRAVENOUS
  Filled 2015-05-03: qty 1

## 2015-05-03 NOTE — ED Provider Notes (Signed)
CSN: [id number]     Arrival date & time 05/02/15  1911 History  By signing my name below, I, Rohini Rajnarayanan, attest that this documentation has been prepared under the direction and in the presence of Everlene Balls, MD Electronically Signed: Evonnie Dawes, ED Scribe 05/03/2015 at 12:48 AM.    Chief Complaint  Patient presents with  . Back Pain   The history is provided by the patient. No language interpreter was used.    HPI Comments: Brent [name] is a 70 y.o. male with a pmhx of Chron's disease, stage 3 chronic kidney disease,  who presents to the Emergency Department complaining of sudden onset, stabbing, back pain that began this morning around 11:15 AM. Pain is exacerbated by movement. Pt states no ameliorating factors. Pt has had back pain in the past, for which he received a back brace and pain medication. However, the pain today is different than any sx experienced in the past. Pt is not a smoker. Pt recently had a colonoscopy, after which an infection was found. He was prescribed Prednisone for the infection. Pt takes medication - Remicade-  for his Chron's disease every 8 weeks.   Past Medical History  Diagnosis Date  . Crohn's   . Anxiety   . Depression   . Renal insufficiency   . Anemia   . Edema   . Other abnormal blood chemistry   . Malnutrition of mild degree ([place])   . Acute cholecystitis   . Abdominal pain, unspecified site   . Viremia, unspecified   . Pain in joint, lower leg   . Loss of weight   . Regional enteritis of small intestine ([place])   . Benign paroxysmal positional vertigo   . Other B-complex deficiencies   . Hypertensive renal disease, benign   . Other B-complex deficiencies   . Anemia, unspecified   . Other specified disease of white blood cells   . Chronic kidney disease, stage III (moderate)   . Unspecified disorder of skin and subcutaneous tissue   . Tobacco use disorder   . Celiac disease   . Essential and other specified forms of tremor    . Abdominal pain, right lower quadrant   . Hypertrophy of prostate without urinary obstruction and other lower urinary tract symptoms (LUTS)   . Dysuria   . Dizziness and giddiness   . Encounter for long-term (current) use of other medications   . Iron deficiency anemia, unspecified   . Essential hypertension, benign   . Other extrapyramidal disease and abnormal movement disorder   . Pain in limb   . Other extrapyramidal disease and abnormal movement disorder   . Insomnia with sleep apnea, unspecified   . Insomnia with sleep apnea, unspecified   . Other specified disease of white blood cells   . Narcolepsy without cataplexy   . Regional enteritis of small intestine with large intestine ([place])   . Hypopotassemia   . Nausea with vomiting   . Viral warts, unspecified   . Impotence of organic origin   . Cough   . Postinflammatory pulmonary fibrosis ([place])   . Cellulitis and abscess of unspecified site   . Dizziness and giddiness   . Dizziness and giddiness   . Other malaise and fatigue   . Diarrhea   . Bipolar I disorder, most recent episode (or current) unspecified   . Impacted cerumen   . Neuralgia, neuritis, and radiculitis, unspecified    Past Surgical History  Procedure Laterality Date  .  Small intestine surgery      x 2  . Cholecystectomy  07-12-2010   Family History  Problem Relation Age of Onset  . Diabetes Mother     maternal grandmother  . Pneumonia Maternal Grandmother   . Emphysema Father   . Colon cancer Neg Hx    Social History  Substance Use Topics  . Smoking status: Former Smoker -- 1.00 packs/day for 49 years    Types: Cigarettes    Start date: 04/11/1956    Quit date: 04/17/2014  . Smokeless tobacco: Never Used  . Alcohol Use: No    Review of Systems 10 Systems reviewed and all are negative for acute change except as noted in the HPI.  Allergies  Azathioprine; Ciprofloxacin; Levaquin; and Plendil  Home Medications   Prior to Admission  medications   Medication Sig Start Date End Date Taking? Authorizing Provider  albuterol (PROVENTIL HFA;VENTOLIN HFA) 108 (90 BASE) MCG/ACT inhaler Inhale 2 puffs TID x 7 days then BID x 7 days then daily x 7 days and stop. Rinse mouth after each use 03/08/14   Gildardo Cranker, DO  amLODipine (NORVASC) 5 MG tablet Take 5 mg by mouth daily.    Historical Provider, MD  Calcium Carbonate-Vit D-Min 600-400 MG-UNIT TABS Take 1 tablet by mouth 2 (two) times daily.      Historical Provider, MD  diphenhydrAMINE (BENADRYL) 25 mg capsule Take 1-2 capsules (25-50 mg total) by mouth once. Prior to infusion 01/23/11   Milus Banister, MD  divalproex (DEPAKOTE) 500 MG EC tablet Take 1,500 mg by mouth daily.     Historical Provider, MD  epoetin alfa (PROCRIT) 15176 UNIT/ML injection Inject 10,000 Units into the skin every 14 (fourteen) days.     Historical Provider, MD  ferrous sulfate 325 (65 FE) MG tablet Take 325 mg by mouth 2 (two) times daily.      Historical Provider, MD  inFLIXimab (REMICADE) 100 MG injection Infuse Remicade IV schedule 1 80m/kg every 8 weeks Premedicate with Tylenol 500-6565mby mouth and Benadryl 25-5057my mouth prior to infusion. Last PPD was on 12/2009.  11/22/10   DanMilus BanisterD  Loperamide HCl (LOPERAMIDE A-D PO) Take 1 capsule by mouth as needed.      Historical Provider, MD  magnesium oxide (MAG-OX) 400 MG tablet Take 400 mg by mouth 2 (two) times daily.     Historical Provider, MD  metoprolol succinate (TOPROL-XL) 25 MG 24 hr tablet Take 25 mg by mouth daily.      Historical Provider, MD  Multiple Vitamin (MULTIVITAMIN) tablet Take 1 tablet by mouth daily.      Historical Provider, MD  OLANZapine (ZYPREXA) 10 MG tablet Take 10 mg by mouth at bedtime.      Historical Provider, MD  omeprazole (PRILOSEC) 20 MG capsule Take 20 mg by mouth daily.      Historical Provider, MD  oxybutynin (DITROPAN XL) 15 MG 24 hr tablet Take 15 mg by mouth daily.      Historical Provider, MD   Tamsulosin HCl (FLOMAX) 0.4 MG CAPS Take 0.4 mg by mouth daily.    Historical Provider, MD  vitamin E 400 UNIT capsule Take 400 Units by mouth daily.      Historical Provider, MD  zoster vaccine live, PF, (ZOSTAVAX) 19416073T/0.65ML injection Inject 19,400 Units into the skin once. Patient not taking: Reported on 11/13/2014 09/05/13   Tiffany L Reed, DO   BP 135/77 mmHg  Pulse 84  Temp(Src) 97.8  F (36.6 C) (Oral)  Resp 18  SpO2 94% Physical Exam  Constitutional: He is oriented to person, place, and time. Vital signs are normal. He appears well-developed and well-nourished.  Non-toxic appearance. He does not appear ill. No distress.  HENT:  Head: Normocephalic and atraumatic.  Nose: Nose normal.  Mouth/Throat: Oropharynx is clear and moist. No oropharyngeal exudate.  Eyes: Conjunctivae and EOM are normal. Pupils are equal, round, and reactive to light. No scleral icterus.  Neck: Normal range of motion. Neck supple. No tracheal deviation, no edema, no erythema and normal range of motion present. No thyroid mass and no thyromegaly present.  Cardiovascular: Normal rate, regular rhythm, S1 normal, S2 normal, normal heart sounds, intact distal pulses and normal pulses.  Exam reveals no gallop and no friction rub.   No murmur heard. Pulmonary/Chest: Effort normal and breath sounds normal. No respiratory distress. He has no wheezes. He has no rhonchi. He has no rales.  Abdominal: Soft. Normal appearance and bowel sounds are normal. He exhibits no distension, no ascites and no mass. There is no hepatosplenomegaly. There is no tenderness. There is no rebound, no guarding and no CVA tenderness.  Musculoskeletal: Normal range of motion. He exhibits no edema or tenderness.  Lymphadenopathy:    He has no cervical adenopathy.  Neurological: He is alert and oriented to person, place, and time. He has normal strength. No cranial nerve deficit or sensory deficit.  Skin: Skin is warm, dry and intact. No  petechiae and no rash noted. He is not diaphoretic. No erythema. No pallor.  Psychiatric: He has a normal mood and affect. His behavior is normal. Judgment normal.  Nursing note and vitals reviewed.   ED Course  Procedures  DIAGNOSTIC STUDIES: Oxygen Saturation is 94% on RA, adequate by my interpretation.    COORDINATION OF CARE: 12:34 AM-Discussed treatment plan which includes EKG, ED pain medication, and CT A/P wo contrast with pt at bedside and pt agreed to plan.   Labs Review Labs Reviewed  CBC WITH DIFFERENTIAL/PLATELET - Abnormal; Notable for the following:    RBC 3.48 (*)    Hemoglobin 11.8 (*)    HCT 36.2 (*)    MCV 104.0 (*)    All other components within normal limits  URINALYSIS, ROUTINE W REFLEX MICROSCOPIC (NOT AT [facility]) - Abnormal; Notable for the following:    Color, Urine AMBER (*)    Bilirubin Urine SMALL (*)    Protein, ur 30 (*)    All other components within normal limits  URINE MICROSCOPIC-ADD ON - Abnormal; Notable for the following:    Squamous Epithelial / LPF 0-5 (*)    Casts HYALINE CASTS (*)    All other components within normal limits  I-STAT CHEM 8, ED - Abnormal; Notable for the following:    Creatinine, Ser 1.50 (*)    Glucose, Bld 103 (*)    Calcium, Ion 1.10 (*)    All other components within normal limits    Imaging Review Ct Abdomen Pelvis Wo Contrast  05/03/2015  CLINICAL DATA:  Sudden onset of back pain. History of Crohn's disease, chronic kidney disease, multiple abdominal surgeries. EXAM: CT ABDOMEN AND PELVIS WITHOUT CONTRAST TECHNIQUE: Multidetector CT imaging of the abdomen and pelvis was performed following the standard protocol without IV contrast. COMPARISON:  07/10/2010 FINDINGS: Atelectasis in the lung bases. Surgical absence of the gallbladder. Mild bile duct dilatation partially due to postoperative change but there is a small stone in the distal common bile duct measuring  about 4 mm. The unenhanced appearance of the liver, spleen,  pancreas, abdominal aorta, inferior vena cava, and retroperitoneal lymph nodes is unremarkable. Scattered calcification in the abdominal aorta. No aortic aneurysm. No retroperitoneal fluid collections. There is a left adrenal gland nodule measuring about 1 cm, likely representing an adenoma. No change since prior study. Multiple bilateral renal cysts also without change. No hydronephrosis in the kidneys. Stomach, small bowel, and colon are decompressed. Postoperative changes with ileal colonic anastomosis. No free air or free fluid in the abdomen. Abdominal wall musculature appears intact. Pelvis: Prostate gland is not enlarged. Bladder wall is not thickened. No free or loculated pelvic fluid collections. No pelvic mass or lymphadenopathy. Degenerative changes in the lumbar spine with narrowed interspaces and endplate hypertrophic changes. Degenerative changes in the facet joints. No vertebral compression deformities. Sacrum, pelvis, and hips appear intact. IMPRESSION: No evidence of abdominal aortic aneurysm. Surgical absence of the gallbladder. Bile duct dilatation is partially due to postoperative change but there is evidence of choledocholithiasis with a small stone in the distal common bile duct. Left adrenal gland adenoma. Electronically Signed   By: Lucienne Capers M.D.   On: 05/03/2015 03:11   I have personally reviewed and evaluated these images and lab results as part of my medical decision-making.   EKG Interpretation   Date/Time:  Wednesday May 02 2015 20:14:15 EST Ventricular Rate:  66 PR Interval:  134 QRS Duration: 92 QT Interval:  390 QTC Calculation: 408 R Axis:   -23 Text Interpretation:  Normal sinus rhythm Minimal voltage criteria for  LVH, may be normal variant Borderline ECG No significant change since last  tracing Confirmed by Glynn Octave [phone]) on 05/03/2015 12:24:55 AM      MDM   Final diagnoses:  None   Patient presents to the ED for sudden onset back  pain while putting on his clothes.  Likely muscular strain, however he states this feels different than he normal back pain.  He has a h/o CKD, so will obtain dry CT to eval for aortic pathology.  Also give morphine and valium for back pain.    CT scan does not reveal any aortic pathology.  This was likely a musculoskeletal pain.  Will DC with tramadol to take as needed, tylenol for daily pain.  PCP fu advised.  He appears well and in NAD.  VS remain within his normal limits and he is safe for DC.    I personally performed the services described in this documentation, which was scribed in my presence. The recorded information has been reviewed and is accurate.     Everlene Balls, MD 05/03/15 [phone]

## 2015-05-03 NOTE — Discharge Instructions (Signed)
Back Injury Prevention [name], your CT scan results are below and do not show a cause of you pain.  You likely pulled a muscle today.  Take tylenol as needed for pain.  If it becomes severe, take tramadol.  See your primary care doctor within 3 days for close follow up. If any symptoms worsen, come back to the ED immediately. Thank you.  IMPRESSION: No evidence of abdominal aortic aneurysm. Surgical absence of the gallbladder. Bile duct dilatation is partially due to postoperative change but there is evidence of choledocholithiasis with a small stone in the distal common bile duct. Left adrenal gland adenoma.   Back injuries can be very painful. They can also be difficult to heal. After having one back injury, you are more likely to injure your back again. It is important to learn how to avoid injuring or re-injuring your back. The following tips can help you to prevent a back injury. WHAT SHOULD I KNOW ABOUT PHYSICAL FITNESS?  Exercise for 30 minutes per day on most days of the week or as told by your doctor. Make sure to:  Do aerobic exercises, such as walking, jogging, biking, or swimming.  Do exercises that increase balance and strength, such as tai chi and yoga.  Do stretching exercises. This helps with flexibility.  Try to develop strong belly (abdominal) muscles. Your belly muscles help to support your back.  Stay at a healthy weight. This helps to decrease your risk of a back injury. WHAT SHOULD I KNOW ABOUT MY DIET?  Talk with your doctor about your overall diet. Take supplements and vitamins only as told by your doctor.  Talk with your doctor about how much calcium and vitamin D you need each day. These nutrients help to prevent weakening of the bones (osteoporosis).  Include good sources of calcium in your diet, such as:  Dairy products.  Green leafy vegetables.  Products that have had calcium added to them (fortified).  Include good sources of vitamin D in your  diet, such as:  Milk.  Foods that have had vitamin D added to them. WHAT SHOULD I KNOW ABOUT MY POSTURE?  Sit up straight and stand up straight. Avoid leaning forward when you sit or hunching over when you stand.  Choose chairs that have good low-back (lumbar) support.  If you work at a desk, sit close to it so you do not need to lean over. Keep your chin tucked in. Keep your neck drawn back. Keep your elbows bent so your arms look like the letter "L" (right angle).  Sit high and close to the steering wheel when you drive. Add a low-back support to your car seat, if needed.  Avoid sitting or standing in one position for very long. Take breaks to get up, stretch, and walk around at least one time every hour. Take breaks every hour if you are driving for long periods of time.  Sleep on your side with your knees slightly bent, or sleep on your back with a pillow under your knees. Do not lie on the front of your body to sleep. WHAT SHOULD I KNOW ABOUT LIFTING, TWISTING, AND REACHING Lifting and Heavy Lifting  Avoid heavy lifting, especially lifting over and over again. If you must do heavy lifting:  Stretch before lifting.  Work slowly.  Rest between lifts.  Use a tool such as a cart or a dolly to move objects if one is available.  Make several small trips instead of carrying one heavy  load.  Ask for help when you need it, especially when moving big objects.  Follow these steps when lifting:  Stand with your feet shoulder-width apart.  Get as close to the object as you can. Do not pick up a heavy object that is far from your body.  Use handles or lifting straps if they are available.  Bend at your knees. Squat down, but keep your heels off the floor.  Keep your shoulders back. Keep your chin tucked in. Keep your back straight.  Lift the object slowly while you tighten the muscles in your legs, belly, and butt. Keep the object as close to the center of your body as  possible.  Follow these steps when putting down a heavy load:  Stand with your feet shoulder-width apart.  Lower the object slowly while you tighten the muscles in your legs, belly, and butt. Keep the object as close to the center of your body as possible.  Keep your shoulders back. Keep your chin tucked in. Keep your back straight.  Bend at your knees. Squat down, but keep your heels off the floor.  Use handles or lifting straps if they are available. Twisting and Reaching  Avoid lifting heavy objects above your waist.  Do not twist at your waist while you are lifting or carrying a load. If you need to turn, move your feet.  Do not bend over without bending at your knees.  Avoid reaching over your head, across a table, or for an object on a high surface.  WHAT ARE SOME OTHER TIPS?  Avoid wet floors and icy ground. Keep sidewalks clear of ice to prevent falls.   Do not sleep on a mattress that is too soft or too hard.   Keep items that you use often within easy reach.   Put heavier objects on shelves at waist level, and put lighter objects on lower or higher shelves.  Find ways to lower your stress, such as:  Exercise.  Massage.  Relaxation techniques.  Talk with your doctor if you feel anxious or depressed. These conditions can make back pain worse.  Wear flat heel shoes with cushioned soles.  Avoid making quick (sudden) movements.  Use both shoulder straps when carrying a backpack.  Do not use any tobacco products, including cigarettes, chewing tobacco, or electronic cigarettes. If you need help quitting, ask your doctor.   This information is not intended to replace advice given to you by your health care provider. Make sure you discuss any questions you have with your health care provider.   Document Released: 07/30/2007 Document Revised: 06/27/2014 Document Reviewed: 02/14/2014 Elsevier Interactive Patient Education [employer].

## 2015-05-22 ENCOUNTER — Encounter (HOSPITAL_COMMUNITY): Payer: Self-pay

## 2015-05-22 ENCOUNTER — Encounter (HOSPITAL_COMMUNITY)
Admission: RE | Admit: 2015-05-22 | Discharge: 2015-05-22 | Disposition: A | Discharge status: Home / Self Care | Payer: Medicare HMO | Source: Ambulatory Visit | Attending: Gastroenterology | Admitting: Gastroenterology

## 2015-05-22 DIAGNOSIS — D638 Anemia in other chronic diseases classified elsewhere: Secondary | ICD-10-CM | POA: Insufficient documentation

## 2015-05-22 DIAGNOSIS — N183 Chronic kidney disease, stage 3 (moderate): Secondary | ICD-10-CM | POA: Diagnosis present

## 2015-05-22 LAB — IRON AND TIBC
Iron: 39 ug/dL — ABNORMAL LOW (ref 45–182)
SATURATION RATIOS: 17 % — AB (ref 17.9–39.5)
TIBC: 235 ug/dL — ABNORMAL LOW (ref 250–450)
UIBC: 196 ug/dL

## 2015-05-22 LAB — HEMOGLOBIN: HEMOGLOBIN: 11.5 g/dL — AB (ref 13.0–17.0)

## 2015-05-22 LAB — FERRITIN: Ferritin: 1565 ng/mL — ABNORMAL HIGH (ref 24–336)

## 2015-05-22 NOTE — Discharge Instructions (Signed)
Procrit Epoetin Alfa injection What is this medicine? EPOETIN ALFA (e POE e tin AL fa) helps your body make more red blood cells. This medicine is used to treat anemia caused by chronic kidney failure, cancer chemotherapy, or HIV-therapy. It may also be used before surgery if you have anemia. This medicine may be used for other purposes; ask your health care provider or pharmacist if you have questions. What should I tell my health care provider before I take this medicine? They need to know if you have any of these conditions: -blood clotting disorders -cancer patient not on chemotherapy -cystic fibrosis -heart disease, such as angina or heart failure -hemoglobin level of 12 g/dL or greater -high blood pressure -low levels of folate, iron, or vitamin B12 -seizures -an unusual or allergic reaction to erythropoietin, albumin, benzyl alcohol, hamster proteins, other medicines, foods, dyes, or preservatives -pregnant or trying to get pregnant -breast-feeding How should I use this medicine? This medicine is for injection into a vein or under the skin. It is usually given by a health care professional in a hospital or clinic setting. If you get this medicine at home, you will be taught how to prepare and give this medicine. Use exactly as directed. Take your medicine at regular intervals. Do not take your medicine more often than directed. It is important that you put your used needles and syringes in a special sharps container. Do not put them in a trash can. If you do not have a sharps container, call your pharmacist or healthcare provider to get one. Talk to your pediatrician regarding the use of this medicine in children. While this drug may be prescribed for selected conditions, precautions do apply. Overdosage: If you think you have taken too much of this medicine contact a poison control center or emergency room at once. NOTE: This medicine is only for you. Do not share this medicine with  others. What if I miss a dose? If you miss a dose, take it as soon as you can. If it is almost time for your next dose, take only that dose. Do not take double or extra doses. What may interact with this medicine? Do not take this medicine with any of the following medications: -darbepoetin alfa This list may not describe all possible interactions. Give your health care provider a list of all the medicines, herbs, non-prescription drugs, or dietary supplements you use. Also tell them if you smoke, drink alcohol, or use illegal drugs. Some items may interact with your medicine. What should I watch for while using this medicine? Visit your prescriber or health care professional for regular checks on your progress and for the needed blood tests and blood pressure measurements. It is especially important for the doctor to make sure your hemoglobin level is in the desired range, to limit the risk of potential side effects and to give you the best benefit. Keep all appointments for any recommended tests. Check your blood pressure as directed. Ask your doctor what your blood pressure should be and when you should contact him or her. As your body makes more red blood cells, you may need to take iron, folic acid, or vitamin B supplements. Ask your doctor or health care provider which products are right for you. If you have kidney disease continue dietary restrictions, even though this medication can make you feel better. Talk with your doctor or health care professional about the foods you eat and the vitamins that you take. What side effects may I  notice from receiving this medicine? Side effects that you should report to your doctor or health care professional as soon as possible: -allergic reactions like skin rash, itching or hives, swelling of the face, lips, or tongue -breathing problems -changes in vision -chest pain -confusion, trouble speaking or understanding -feeling faint or lightheaded,  falls -high blood pressure -muscle aches or pains -pain, swelling, warmth in the leg -rapid weight gain -severe headaches -sudden numbness or weakness of the face, arm or leg -trouble walking, dizziness, loss of balance or coordination -seizures (convulsions) -swelling of the ankles, feet, hands -unusually weak or tired Side effects that usually do not require medical attention (report to your doctor or health care professional if they continue or are bothersome): -diarrhea -fever, chills (flu-like symptoms) -headaches -nausea, vomiting -redness, stinging, or swelling at site where injected This list may not describe all possible side effects. Call your doctor for medical advice about side effects. You may report side effects to FDA at 1-800-FDA-1088. Where should I keep my medicine? Keep out of the reach of children. Store in a refrigerator between 2 and 8 degrees C (36 and 46 degrees F). Do not freeze or shake. Throw away any unused portion if using a single-dose vial. Multi-dose vials can be kept in the refrigerator for up to 21 days after the initial dose. Throw away unused medicine. NOTE: This sheet is a summary. It may not cover all possible information. If you have questions about this medicine, talk to your doctor, pharmacist, or health care provider.    2016, Elsevier/Gold Standard. (2008-01-25 10:25:44)

## 2015-06-19 ENCOUNTER — Encounter (HOSPITAL_COMMUNITY): Payer: Self-pay

## 2015-06-19 ENCOUNTER — Encounter (HOSPITAL_COMMUNITY)
Admission: RE | Admit: 2015-06-19 | Discharge: 2015-06-19 | Disposition: A | Discharge status: Home / Self Care | Payer: Medicare HMO | Source: Ambulatory Visit | Attending: Gastroenterology | Admitting: Gastroenterology

## 2015-06-19 DIAGNOSIS — D6489 Other specified anemias: Secondary | ICD-10-CM | POA: Diagnosis not present

## 2015-06-19 DIAGNOSIS — N183 Chronic kidney disease, stage 3 (moderate): Secondary | ICD-10-CM | POA: Diagnosis not present

## 2015-06-19 LAB — IRON AND TIBC
Iron: 57 ug/dL (ref 45–182)
Saturation Ratios: 24 % (ref 17.9–39.5)
TIBC: 241 ug/dL — ABNORMAL LOW (ref 250–450)
UIBC: 184 ug/dL

## 2015-06-19 LAB — FERRITIN: Ferritin: 1373 ng/mL — ABNORMAL HIGH (ref 24–336)

## 2015-06-19 LAB — HEMOGLOBIN: Hemoglobin: 10.8 g/dL — ABNORMAL LOW (ref 13.0–17.0)

## 2015-06-19 MED ORDER — EPOETIN ALFA 10000 UNIT/ML IJ SOLN
10000.0000 [IU] | INTRAMUSCULAR | Status: DC
  Administered 2015-06-19: 10000 [IU] via SUBCUTANEOUS
  Filled 2015-06-19: qty 1

## 2015-06-21 LAB — HM COLONOSCOPY

## 2015-07-17 ENCOUNTER — Encounter (HOSPITAL_COMMUNITY): Payer: Self-pay

## 2015-07-17 ENCOUNTER — Encounter (HOSPITAL_COMMUNITY)
Admission: RE | Admit: 2015-07-17 | Discharge: 2015-07-17 | Disposition: A | Discharge status: Home / Self Care | Payer: Medicare HMO | Source: Ambulatory Visit | Attending: Gastroenterology | Admitting: Gastroenterology

## 2015-07-17 DIAGNOSIS — N183 Chronic kidney disease, stage 3 (moderate): Secondary | ICD-10-CM | POA: Insufficient documentation

## 2015-07-17 DIAGNOSIS — D6489 Other specified anemias: Secondary | ICD-10-CM | POA: Diagnosis not present

## 2015-07-17 LAB — IRON AND TIBC
Iron: 99 ug/dL (ref 45–182)
Saturation Ratios: 46 % — ABNORMAL HIGH (ref 17.9–39.5)
TIBC: 214 ug/dL — ABNORMAL LOW (ref 250–450)
UIBC: 115 ug/dL

## 2015-07-17 LAB — HEMOGLOBIN: HEMOGLOBIN: 11.3 g/dL — AB (ref 13.0–17.0)

## 2015-07-17 LAB — FERRITIN: FERRITIN: 1317 ng/mL — AB (ref 24–336)

## 2015-07-17 MED ORDER — EPOETIN ALFA 10000 UNIT/ML IJ SOLN
10000.0000 [IU] | INTRAMUSCULAR | Status: DC
  Administered 2015-07-17: 10000 [IU] via SUBCUTANEOUS
  Filled 2015-07-17: qty 1

## 2015-08-10 ENCOUNTER — Other Ambulatory Visit: Payer: Medicare HMO

## 2015-08-10 DIAGNOSIS — I10 Essential (primary) hypertension: Secondary | ICD-10-CM

## 2015-08-10 DIAGNOSIS — E785 Hyperlipidemia, unspecified: Secondary | ICD-10-CM

## 2015-08-10 DIAGNOSIS — N183 Chronic kidney disease, stage 3 unspecified: Secondary | ICD-10-CM

## 2015-08-10 DIAGNOSIS — R609 Edema, unspecified: Secondary | ICD-10-CM

## 2015-08-10 DIAGNOSIS — D509 Iron deficiency anemia, unspecified: Secondary | ICD-10-CM

## 2015-08-10 DIAGNOSIS — N400 Enlarged prostate without lower urinary tract symptoms: Secondary | ICD-10-CM

## 2015-08-10 DIAGNOSIS — K50111 Crohn's disease of large intestine with rectal bleeding: Secondary | ICD-10-CM

## 2015-08-11 LAB — LIPID PANEL
Chol/HDL Ratio: 2.6 ratio units (ref 0.0–5.0)
Cholesterol, Total: 137 mg/dL (ref 100–199)
HDL: 52 mg/dL (ref >39)
LDL Calculated: 64 mg/dL (ref 0–99)
Triglycerides: 104 mg/dL (ref 0–149)
VLDL Cholesterol Cal: 21 mg/dL (ref 5–40)

## 2015-08-11 LAB — COMPREHENSIVE METABOLIC PANEL
ALT: 7 IU/L (ref 0–44)
AST: 15 IU/L (ref 0–40)
Albumin/Globulin Ratio: 1.3 (ref 1.2–2.2)
Albumin: 3.5 g/dL (ref 3.5–4.8)
Alkaline Phosphatase: 54 IU/L (ref 39–117)
BUN/Creatinine Ratio: 10 (ref 10–24)
BUN: 15 mg/dL (ref 8–27)
Bilirubin Total: 0.3 mg/dL (ref 0.0–1.2)
CO2: 22 mmol/L (ref 18–29)
Calcium: 8.4 mg/dL — ABNORMAL LOW (ref 8.6–10.2)
Chloride: 103 mmol/L (ref 96–106)
Creatinine, Ser: 1.54 mg/dL — ABNORMAL HIGH (ref 0.76–1.27)
GFR calc Af Amer: 52 mL/min/{1.73_m2} — ABNORMAL LOW (ref >59)
GFR calc non Af Amer: 45 mL/min/{1.73_m2} — ABNORMAL LOW (ref >59)
Globulin, Total: 2.7 g/dL (ref 1.5–4.5)
Glucose: 104 mg/dL — ABNORMAL HIGH (ref 65–99)
Potassium: 3.7 mmol/L (ref 3.5–5.2)
Sodium: 143 mmol/L (ref 134–144)
Total Protein: 6.2 g/dL (ref 6.0–8.5)

## 2015-08-11 LAB — CBC WITH DIFFERENTIAL/PLATELET
Basophils Absolute: 0 10*3/uL (ref 0.0–0.2)
Basos: 0 %
EOS (ABSOLUTE): 0.2 10*3/uL (ref 0.0–0.4)
Eos: 3 %
Hematocrit: 35.9 % — ABNORMAL LOW (ref 37.5–51.0)
Hemoglobin: 11.7 g/dL — ABNORMAL LOW (ref 12.6–17.7)
Immature Grans (Abs): 0 10*3/uL (ref 0.0–0.1)
Immature Granulocytes: 0 %
Lymphocytes Absolute: 2.9 10*3/uL (ref 0.7–3.1)
Lymphs: 38 %
MCH: 32.3 pg (ref 26.6–33.0)
MCHC: 32.6 g/dL (ref 31.5–35.7)
MCV: 99 fL — ABNORMAL HIGH (ref 79–97)
Monocytes Absolute: 0.5 10*3/uL (ref 0.1–0.9)
Monocytes: 6 %
Neutrophils Absolute: 4 10*3/uL (ref 1.4–7.0)
Neutrophils: 53 %
Platelets: 167 10*3/uL (ref 150–379)
RBC: 3.62 x10E6/uL — ABNORMAL LOW (ref 4.14–5.80)
RDW: 13.5 % (ref 12.3–15.4)
WBC: 7.7 10*3/uL (ref 3.4–10.8)

## 2015-08-11 LAB — HEMOGLOBIN A1C
Est. average glucose Bld gHb Est-mCnc: 94 mg/dL
Hgb A1c MFr Bld: 4.9 % (ref 4.8–5.6)

## 2015-08-13 ENCOUNTER — Encounter: Payer: Self-pay | Admitting: *Deleted

## 2015-08-13 ENCOUNTER — Other Ambulatory Visit: Payer: Medicare HMO

## 2015-08-14 ENCOUNTER — Encounter (HOSPITAL_COMMUNITY)
Admission: RE | Admit: 2015-08-14 | Discharge: 2015-08-14 | Disposition: A | Discharge status: Home / Self Care | Payer: Medicare HMO | Source: Ambulatory Visit | Attending: Gastroenterology | Admitting: Gastroenterology

## 2015-08-14 ENCOUNTER — Encounter (HOSPITAL_COMMUNITY): Payer: Self-pay

## 2015-08-14 DIAGNOSIS — D6489 Other specified anemias: Secondary | ICD-10-CM | POA: Insufficient documentation

## 2015-08-14 DIAGNOSIS — N183 Chronic kidney disease, stage 3 (moderate): Secondary | ICD-10-CM | POA: Diagnosis present

## 2015-08-14 LAB — IRON AND TIBC
Iron: 102 ug/dL (ref 45–182)
SATURATION RATIOS: 54 % — AB (ref 17.9–39.5)
TIBC: 189 ug/dL — ABNORMAL LOW (ref 250–450)
UIBC: 87 ug/dL

## 2015-08-14 LAB — HEMOGLOBIN: HEMOGLOBIN: 11.2 g/dL — AB (ref 13.0–17.0)

## 2015-08-14 LAB — FERRITIN: Ferritin: 1113 ng/mL — ABNORMAL HIGH (ref 24–336)

## 2015-08-14 MED ORDER — EPOETIN ALFA 10000 UNIT/ML IJ SOLN
10000.0000 [IU] | INTRAMUSCULAR | Status: DC
  Administered 2015-08-14: 10000 [IU] via SUBCUTANEOUS
  Filled 2015-08-14: qty 1

## 2015-08-16 ENCOUNTER — Ambulatory Visit (INDEPENDENT_AMBULATORY_CARE_PROVIDER_SITE_OTHER): Payer: Medicare HMO | Admitting: Internal Medicine

## 2015-08-16 ENCOUNTER — Encounter: Payer: Self-pay | Admitting: Internal Medicine

## 2015-08-16 VITALS — BP 120/60 | HR 95 | Temp 98.6°F | Ht 76.0 in | Wt 168.0 lb

## 2015-08-16 DIAGNOSIS — K50111 Crohn's disease of large intestine with rectal bleeding: Secondary | ICD-10-CM | POA: Diagnosis not present

## 2015-08-16 DIAGNOSIS — N183 Chronic kidney disease, stage 3 unspecified: Secondary | ICD-10-CM

## 2015-08-16 DIAGNOSIS — D509 Iron deficiency anemia, unspecified: Secondary | ICD-10-CM

## 2015-08-16 DIAGNOSIS — J8410 Pulmonary fibrosis, unspecified: Secondary | ICD-10-CM | POA: Diagnosis not present

## 2015-08-16 DIAGNOSIS — I10 Essential (primary) hypertension: Secondary | ICD-10-CM

## 2015-08-16 DIAGNOSIS — G47419 Narcolepsy without cataplexy: Secondary | ICD-10-CM | POA: Insufficient documentation

## 2015-08-16 DIAGNOSIS — F319 Bipolar disorder, unspecified: Secondary | ICD-10-CM

## 2015-08-16 DIAGNOSIS — K219 Gastro-esophageal reflux disease without esophagitis: Secondary | ICD-10-CM | POA: Diagnosis not present

## 2015-08-16 HISTORY — DX: Narcolepsy without cataplexy: G47.419

## 2015-08-16 NOTE — Progress Notes (Signed)
Location:  [facility] clinic Provider:  Dezaree Tracey L. Mariea Clonts, D.O., C.M.D.  Goals of Care:  Advanced Directives 08/16/2015  Does patient have an advance directive? No  Type of Advance Directive -  Copy of advanced directive(s) in chart? -  Would patient like information on creating an advanced directive? -  Pre-existing out of facility DNR order (yellow form or pink MOST form) -   Chief Complaint  Patient presents with  . Medical Management of Chronic Issues    follow-up, have not been seen in a while    HPI: Patient is a 70 y.o. male seen today for medical management of chronic diseases.    Quit smoking on valentine's day this year.    Had a colonoscopy done 06/21/15 and the GI specialist at the [facility] was saying he might need surgery, but then the next one did not think so.  Has frequent BMs, but no bleeding.  Uses loperamide for that.  Last remicade 2 days ago.  Is different now with four small bags instead of 1 big bag.  Getting at the [place] now.    Bipolar:  No changes to medications.  Had a tragedy.  His bother has uterine cancer and may now have colon cancer also.  She was getting diabetes shots, having radiation therapy.  She's at [facility] now.  His sister's husband has prostate cancer.  Says he'd been off of his depakote at one point in the midst of that but his psychiatrist got him back on it.  Also on zyprexa.  Pt reports he never had narcolepsy.    Has been running too much doing things and is losing a little weight.  Is down to 160s from 170s.    Still getting up frequently at night--is on ditropan and flomax.    Anemia:  Getting procrit shots each time for his kidneys when he went to Dr. Posey Pronto.  He is followed there for his CKD and he is NOT on an ACE/ARB at their decision. Also continues on iron.    No pain.  Back is better.  Acid reflux:  Takes omeprazole.  Sometimes hard to belch--has to sit up to do it.    Past Medical History  Diagnosis Date  . Crohn's   . Anxiety   .  Depression   . Renal insufficiency   . Anemia   . Edema   . Other abnormal blood chemistry   . Malnutrition of mild degree ([place])   . Acute cholecystitis   . Abdominal pain, unspecified site   . Viremia, unspecified   . Pain in joint, lower leg   . Loss of weight   . Regional enteritis of small intestine ([place])   . Benign paroxysmal positional vertigo   . Other B-complex deficiencies   . Hypertensive renal disease, benign   . Other B-complex deficiencies   . Anemia, unspecified   . Other specified disease of white blood cells   . Chronic kidney disease, stage III (moderate)   . Unspecified disorder of skin and subcutaneous tissue   . Tobacco use disorder   . Celiac disease   . Essential and other specified forms of tremor   . Abdominal pain, right lower quadrant   . Hypertrophy of prostate without urinary obstruction and other lower urinary tract symptoms (LUTS)   . Dysuria   . Dizziness and giddiness   . Encounter for long-term (current) use of other medications   . Iron deficiency anemia, unspecified   . Essential  hypertension, benign   . Other extrapyramidal disease and abnormal movement disorder   . Pain in limb   . Other extrapyramidal disease and abnormal movement disorder   . Insomnia with sleep apnea, unspecified   . Insomnia with sleep apnea, unspecified   . Other specified disease of white blood cells   . Narcolepsy without cataplexy   . Regional enteritis of small intestine with large intestine ([place])   . Hypopotassemia   . Nausea with vomiting   . Viral warts, unspecified   . Impotence of organic origin   . Cough   . Postinflammatory pulmonary fibrosis ([place])   . Cellulitis and abscess of unspecified site   . Dizziness and giddiness   . Dizziness and giddiness   . Other malaise and fatigue   . Diarrhea   . Bipolar I disorder, most recent episode (or current) unspecified   . Impacted cerumen   . Neuralgia, neuritis, and radiculitis, unspecified     Past  Surgical History  Procedure Laterality Date  . Small intestine surgery      x 2  . Cholecystectomy  07-12-2010    Allergies  Allergen Reactions  . Azathioprine     REACTION: affected WBC  . Ciprofloxacin     Pt denies any reaction  . Levaquin [Levofloxacin In D5w]     Pt denies any reaction  . Plendil [Felodipine]     Pt denies any reaction      Medication List       This list is accurate as of: 08/16/15 11:12 AM.  Always use your most recent med list.               amLODipine 5 MG tablet  Commonly known as:  NORVASC  Take 5 mg by mouth daily.     Calcium Carbonate-Vit D-Min 600-400 MG-UNIT Tabs  Take 1 tablet by mouth 2 (two) times daily.     divalproex 500 MG DR tablet  Commonly known as:  DEPAKOTE  Take 1,500 mg by mouth daily.     ferrous sulfate 325 (65 FE) MG tablet  Take 325 mg by mouth 2 (two) times daily.     inFLIXimab 100 MG injection  Commonly known as:  REMICADE  Infuse Remicade IV schedule 1 81m/kg every 8 weeks Premedicate with Tylenol 500-651mby mouth and Benadryl 25-5067my mouth prior to infusion. Last PPD was on 12/2009.     LOPERAMIDE A-D PO  Take 1 capsule by mouth as needed (loose stools).     magnesium oxide 400 MG tablet  Commonly known as:  MAG-OX  Take 400 mg by mouth 2 (two) times daily.     metoprolol succinate 25 MG 24 hr tablet  Commonly known as:  TOPROL-XL  Take 25 mg by mouth daily.     multivitamin tablet  Take 1 tablet by mouth daily.     OLANZapine 10 MG tablet  Commonly known as:  ZYPREXA  Take 10 mg by mouth at bedtime.     omeprazole 20 MG capsule  Commonly known as:  PRILOSEC  Take 20 mg by mouth daily.     oxybutynin 15 MG 24 hr tablet  Commonly known as:  DITROPAN XL  Take 15 mg by mouth daily.     prazosin 5 MG capsule  Commonly known as:  MINIPRESS  Take 5 mg by mouth at bedtime.     PROCRIT 10056387IT/ML injection  Generic drug:  epoetin alfa  Inject 10,000 Units into  the skin every 14  (fourteen) days.     tamsulosin 0.4 MG Caps capsule  Commonly known as:  FLOMAX  Take 0.4 mg by mouth daily.     traMADol 50 MG tablet  Commonly known as:  ULTRAM  Take 1 tablet (50 mg total) by mouth every 12 (twelve) hours as needed for severe pain.     vitamin E 400 UNIT capsule  Take 400 Units by mouth daily.        Review of Systems:  Review of Systems  Constitutional: Negative for fever, chills and malaise/fatigue.  HENT: Positive for hearing loss. Negative for congestion.   Eyes: Negative for blurred vision.       Glasses  Respiratory: Negative for cough, shortness of breath and wheezing.   Cardiovascular: Negative for chest pain, palpitations and leg swelling.  Gastrointestinal: Positive for diarrhea and blood in stool. Negative for constipation and melena.  Genitourinary: Negative for dysuria.  Musculoskeletal: Negative for myalgias and falls.  Skin: Negative for rash.  Neurological: Negative for dizziness, loss of consciousness and weakness.  Psychiatric/Behavioral: Negative for memory loss.       Bipolar disorder    Health Maintenance  Topic Date Due  . Hepatitis C Screening  Nov 21, 1945  . ZOSTAVAX  04/11/2005  . PNA vac Low Risk Adult (2 of 2 - PPSV23) 08/10/2015  . INFLUENZA VACCINE  09/25/2015  . TETANUS/TDAP  05/25/2021  . COLONOSCOPY  07/09/2023    Physical Exam: Filed Vitals:   08/16/15 1051  BP: 120/60  Pulse: 95  Temp: 98.6 F (37 C)  TempSrc: Oral  Height: 6' 4"  (1.93 m)  Weight: 168 lb (76.204 kg)  SpO2: 95%   Body mass index is 20.46 kg/(m^2). Physical Exam  Constitutional: He is oriented to person, place, and time. No distress.  Tall thin black male  Cardiovascular: Normal rate, regular rhythm, normal heart sounds and intact distal pulses.   Pulmonary/Chest: Effort normal and breath sounds normal. No respiratory distress.  Abdominal: Soft. Bowel sounds are normal. He exhibits no distension and no mass. There is no tenderness.    Musculoskeletal: Normal range of motion. He exhibits no tenderness.  Neurological: He is alert and oriented to person, place, and time.  Skin: Skin is warm and dry.  Psychiatric: He has a normal mood and affect.    Labs reviewed: Basic Metabolic Panel:  Recent Labs  03/27/15 1330 05/03/15 0129 08/10/15 1322  NA 140 144 143  K 4.7 3.5 3.7  CL 106 106 103  CO2 27  --  22  GLUCOSE 102* 103* 104*  BUN 28* 20 15  CREATININE 1.93* 1.50* 1.54*  CALCIUM 8.8*  8.3*  --  8.4*  MG 1.2*  --   --   PHOS 2.1*  --   --    Liver Function Tests:  Recent Labs  03/27/15 1330 08/10/15 1322  AST  --  15  ALT  --  7  ALKPHOS  --  54  BILITOT  --  0.3  PROT  --  6.2  ALBUMIN 3.4* 3.5   No results for input(s): LIPASE, AMYLASE in the last 8760 hours. No results for input(s): AMMONIA in the last 8760 hours. CBC:  Recent Labs  03/27/15 1330  05/03/15 0107 05/03/15 0129  06/19/15 1415 07/17/15 1100 08/10/15 1322 08/14/15 1100  WBC 8.2  --  8.7  --   --   --   --  7.7  --   NEUTROABS  --   --  6.7  --   --   --   --  4.0  --   HGB 11.0*  < > 11.8* 13.9  < > 10.8* 11.3*  --  11.2*  HCT 34.5*  --  36.2* 41.0  --   --   --  35.9*  --   MCV 103.9*  --  104.0*  --   --   --   --  99*  --   PLT 167  --  250  --   --   --   --  167  --   < > = values in this interval not displayed. Lipid Panel:  Recent Labs  08/10/15 1322  CHOL 137  HDL 52  LDLCALC 64  TRIG 104  CHOLHDL 2.6   Lab Results  Component Value Date   HGBA1C 4.9 08/10/2015    Assessment/Plan 1. Postinflammatory pulmonary fibrosis (HCC) - does not have any c/o sob, hypoxia, doing fine  2. Crohn's colitis, with rectal bleeding ([place]) -had cscope recently this year at [facility] current therapy  -still has some loose stools  3. Essential hypertension, benign -bp is well controlled on current therapy--cont same and monitor  4. Chronic kidney disease (CKD), stage III (moderate) -renal function has been stable,  cont to hydrate,, avoid nsaids and other nephrotoxic agents  5. Gastroesophageal reflux disease, esophagitis presence not specified -cont ppi and conservative preventive measures  6. Bipolar 1 disorder ([place]) -followed by psychiatry for this and did struggle some in the recent past due to his mother having cancer and he had been looking after her until she became too ill and required snf placement--he seems better now (during that time, he missed appts here and was off of crucial meds)  7. Anemia, iron deficiency -cont iron supplement and procrit as needed through renal -is mixed etiology--also due to ckd  Labs/tests ordered:   Orders Placed This Encounter  Procedures  . HM COLONOSCOPY    This external order was created through the Results Console.    Next appt:  11/16/2015 med mgt  Breyon Blass L. Jerelene Salaam, D.O. [facility] [address], [place] [address] Cell Phone (Mon-Fri 8am-5pm):  [phone] On Call:  [phone] & follow prompts after 5pm & weekends Office Phone:  [phone] Office Fax:  [phone]

## 2015-09-11 ENCOUNTER — Encounter (HOSPITAL_COMMUNITY): Payer: Self-pay

## 2015-09-11 ENCOUNTER — Encounter (HOSPITAL_COMMUNITY)
Admission: RE | Admit: 2015-09-11 | Discharge: 2015-09-11 | Disposition: A | Discharge status: Home / Self Care | Payer: Medicare HMO | Source: Ambulatory Visit | Attending: Gastroenterology | Admitting: Gastroenterology

## 2015-09-11 DIAGNOSIS — D6489 Other specified anemias: Secondary | ICD-10-CM | POA: Diagnosis not present

## 2015-09-11 DIAGNOSIS — N183 Chronic kidney disease, stage 3 (moderate): Secondary | ICD-10-CM | POA: Insufficient documentation

## 2015-09-11 LAB — IRON AND TIBC
Iron: 72 ug/dL (ref 45–182)
Saturation Ratios: 38 % (ref 17.9–39.5)
TIBC: 189 ug/dL — ABNORMAL LOW (ref 250–450)
UIBC: 117 ug/dL

## 2015-09-11 LAB — FERRITIN: Ferritin: 1030 ng/mL — ABNORMAL HIGH (ref 24–336)

## 2015-09-11 LAB — HEMOGLOBIN: Hemoglobin: 11.1 g/dL — ABNORMAL LOW (ref 13.0–17.0)

## 2015-09-11 MED ORDER — EPOETIN ALFA 10000 UNIT/ML IJ SOLN
10000.0000 [IU] | INTRAMUSCULAR | Status: DC
  Administered 2015-09-11: 10000 [IU] via SUBCUTANEOUS
  Filled 2015-09-11: qty 1

## 2015-10-05 ENCOUNTER — Encounter: Payer: Self-pay | Admitting: Internal Medicine

## 2015-10-09 ENCOUNTER — Encounter (HOSPITAL_COMMUNITY)
Admission: RE | Admit: 2015-10-09 | Discharge: 2015-10-09 | Disposition: A | Discharge status: Home / Self Care | Payer: Medicare HMO | Source: Ambulatory Visit | Attending: Gastroenterology | Admitting: Gastroenterology

## 2015-10-09 ENCOUNTER — Encounter (HOSPITAL_COMMUNITY): Admission: RE | Admit: 2015-10-09 | Payer: Medicare HMO | Source: Ambulatory Visit

## 2015-10-09 ENCOUNTER — Encounter (HOSPITAL_COMMUNITY): Payer: Self-pay

## 2015-10-09 DIAGNOSIS — D6489 Other specified anemias: Secondary | ICD-10-CM | POA: Diagnosis not present

## 2015-10-09 DIAGNOSIS — N183 Chronic kidney disease, stage 3 unspecified: Secondary | ICD-10-CM

## 2015-10-09 LAB — IRON AND TIBC
Iron: 83 ug/dL (ref 45–182)
SATURATION RATIOS: 41 % — AB (ref 17.9–39.5)
TIBC: 202 ug/dL — ABNORMAL LOW (ref 250–450)
UIBC: 119 ug/dL

## 2015-10-09 LAB — HEMOGLOBIN: HEMOGLOBIN: 11 g/dL — AB (ref 13.0–17.0)

## 2015-10-09 LAB — FERRITIN: Ferritin: 973 ng/mL — ABNORMAL HIGH (ref 24–336)

## 2015-10-09 MED ORDER — EPOETIN ALFA 10000 UNIT/ML IJ SOLN
10000.0000 [IU] | INTRAMUSCULAR | Status: DC
  Administered 2015-10-09: 10000 [IU] via SUBCUTANEOUS
  Filled 2015-10-09: qty 1

## 2015-10-09 NOTE — Discharge Instructions (Signed)
Epoetin Alfa injection What is this medicine? EPOETIN ALFA (e POE e tin AL fa) helps your body make more red blood cells. This medicine is used to treat anemia caused by chronic kidney failure, cancer chemotherapy, or HIV-therapy. It may also be used before surgery if you have anemia. This medicine may be used for other purposes; ask your health care provider or pharmacist if you have questions. What should I tell my health care provider before I take this medicine? They need to know if you have any of these conditions: -blood clotting disorders -cancer patient not on chemotherapy -cystic fibrosis -heart disease, such as angina or heart failure -hemoglobin level of 12 g/dL or greater -high blood pressure -low levels of folate, iron, or vitamin B12 -seizures -an unusual or allergic reaction to erythropoietin, albumin, benzyl alcohol, hamster proteins, other medicines, foods, dyes, or preservatives -pregnant or trying to get pregnant -breast-feeding How should I use this medicine? This medicine is for injection into a vein or under the skin. It is usually given by a health care professional in a hospital or clinic setting. If you get this medicine at home, you will be taught how to prepare and give this medicine. Use exactly as directed. Take your medicine at regular intervals. Do not take your medicine more often than directed. It is important that you put your used needles and syringes in a special sharps container. Do not put them in a trash can. If you do not have a sharps container, call your pharmacist or healthcare provider to get one. Talk to your pediatrician regarding the use of this medicine in children. While this drug may be prescribed for selected conditions, precautions do apply. Overdosage: If you think you have taken too much of this medicine contact a poison control center or emergency room at once. NOTE: This medicine is only for you. Do not share this medicine with  others. What if I miss a dose? If you miss a dose, take it as soon as you can. If it is almost time for your next dose, take only that dose. Do not take double or extra doses. What may interact with this medicine? Do not take this medicine with any of the following medications: -darbepoetin alfa This list may not describe all possible interactions. Give your health care provider a list of all the medicines, herbs, non-prescription drugs, or dietary supplements you use. Also tell them if you smoke, drink alcohol, or use illegal drugs. Some items may interact with your medicine. What should I watch for while using this medicine? Visit your prescriber or health care professional for regular checks on your progress and for the needed blood tests and blood pressure measurements. It is especially important for the doctor to make sure your hemoglobin level is in the desired range, to limit the risk of potential side effects and to give you the best benefit. Keep all appointments for any recommended tests. Check your blood pressure as directed. Ask your doctor what your blood pressure should be and when you should contact him or her. As your body makes more red blood cells, you may need to take iron, folic acid, or vitamin B supplements. Ask your doctor or health care provider which products are right for you. If you have kidney disease continue dietary restrictions, even though this medication can make you feel better. Talk with your doctor or health care professional about the foods you eat and the vitamins that you take. What side effects may I notice   from receiving this medicine? Side effects that you should report to your doctor or health care professional as soon as possible: -allergic reactions like skin rash, itching or hives, swelling of the face, lips, or tongue -breathing problems -changes in vision -chest pain -confusion, trouble speaking or understanding -feeling faint or lightheaded,  falls -high blood pressure -muscle aches or pains -pain, swelling, warmth in the leg -rapid weight gain -severe headaches -sudden numbness or weakness of the face, arm or leg -trouble walking, dizziness, loss of balance or coordination -seizures (convulsions) -swelling of the ankles, feet, hands -unusually weak or tired Side effects that usually do not require medical attention (report to your doctor or health care professional if they continue or are bothersome): -diarrhea -fever, chills (flu-like symptoms) -headaches -nausea, vomiting -redness, stinging, or swelling at site where injected This list may not describe all possible side effects. Call your doctor for medical advice about side effects. You may report side effects to FDA at 1-800-FDA-1088. Where should I keep my medicine? Keep out of the reach of children. Store in a refrigerator between 2 and 8 degrees C (36 and 46 degrees F). Do not freeze or shake. Throw away any unused portion if using a single-dose vial. Multi-dose vials can be kept in the refrigerator for up to 21 days after the initial dose. Throw away unused medicine. NOTE: This sheet is a summary. It may not cover all possible information. If you have questions about this medicine, talk to your doctor, pharmacist, or health care provider.    2016, [employer]. (2008-01-25 10:25:44)  

## 2015-11-05 ENCOUNTER — Encounter: Payer: Medicare HMO | Admitting: Internal Medicine

## 2015-11-05 ENCOUNTER — Encounter: Payer: Self-pay | Admitting: Internal Medicine

## 2015-11-06 ENCOUNTER — Encounter (HOSPITAL_COMMUNITY): Payer: Self-pay

## 2015-11-06 ENCOUNTER — Encounter (HOSPITAL_COMMUNITY)
Admission: RE | Admit: 2015-11-06 | Discharge: 2015-11-06 | Disposition: A | Discharge status: Home / Self Care | Payer: Medicare HMO | Source: Ambulatory Visit | Attending: Gastroenterology | Admitting: Gastroenterology

## 2015-11-06 DIAGNOSIS — N183 Chronic kidney disease, stage 3 unspecified: Secondary | ICD-10-CM

## 2015-11-06 DIAGNOSIS — D638 Anemia in other chronic diseases classified elsewhere: Secondary | ICD-10-CM | POA: Insufficient documentation

## 2015-11-06 LAB — IRON AND TIBC
IRON: 112 ug/dL (ref 45–182)
Saturation Ratios: 52 % — ABNORMAL HIGH (ref 17.9–39.5)
TIBC: 214 ug/dL — AB (ref 250–450)
UIBC: 102 ug/dL

## 2015-11-06 LAB — FERRITIN: FERRITIN: 951 ng/mL — AB (ref 24–336)

## 2015-11-06 LAB — HEMOGLOBIN: Hemoglobin: 11.7 g/dL — ABNORMAL LOW (ref 13.0–17.0)

## 2015-11-16 ENCOUNTER — Ambulatory Visit: Payer: Medicare HMO | Admitting: Internal Medicine

## 2015-12-04 ENCOUNTER — Encounter (HOSPITAL_COMMUNITY): Payer: Medicare HMO

## 2015-12-05 ENCOUNTER — Encounter (HOSPITAL_COMMUNITY)
Admission: RE | Admit: 2015-12-05 | Discharge: 2015-12-05 | Disposition: A | Discharge status: Home / Self Care | Payer: Medicare HMO | Source: Ambulatory Visit | Attending: Gastroenterology | Admitting: Gastroenterology

## 2015-12-05 ENCOUNTER — Encounter (HOSPITAL_COMMUNITY): Payer: Self-pay

## 2015-12-05 DIAGNOSIS — N183 Chronic kidney disease, stage 3 unspecified: Secondary | ICD-10-CM

## 2015-12-05 DIAGNOSIS — D6489 Other specified anemias: Secondary | ICD-10-CM | POA: Diagnosis not present

## 2015-12-05 LAB — IRON AND TIBC
Iron: 53 ug/dL (ref 45–182)
Saturation Ratios: 26 % (ref 17.9–39.5)
TIBC: 206 ug/dL — AB (ref 250–450)
UIBC: 153 ug/dL

## 2015-12-05 LAB — FERRITIN: FERRITIN: 945 ng/mL — AB (ref 24–336)

## 2015-12-05 LAB — HEMOGLOBIN: HEMOGLOBIN: 10.9 g/dL — AB (ref 13.0–17.0)

## 2015-12-05 MED ORDER — EPOETIN ALFA 10000 UNIT/ML IJ SOLN
10000.0000 [IU] | INTRAMUSCULAR | Status: DC
  Administered 2015-12-05: 10000 [IU] via SUBCUTANEOUS
  Filled 2015-12-05: qty 1

## 2015-12-26 ENCOUNTER — Encounter: Payer: Medicare HMO | Admitting: Internal Medicine

## 2016-01-01 ENCOUNTER — Encounter (HOSPITAL_COMMUNITY): Payer: Self-pay

## 2016-01-01 ENCOUNTER — Encounter (HOSPITAL_COMMUNITY)
Admission: RE | Admit: 2016-01-01 | Discharge: 2016-01-01 | Disposition: A | Discharge status: Home / Self Care | Payer: Medicare HMO | Source: Ambulatory Visit | Attending: Gastroenterology | Admitting: Gastroenterology

## 2016-01-01 DIAGNOSIS — N183 Chronic kidney disease, stage 3 unspecified: Secondary | ICD-10-CM

## 2016-01-01 DIAGNOSIS — D6489 Other specified anemias: Secondary | ICD-10-CM | POA: Insufficient documentation

## 2016-01-01 LAB — FERRITIN: FERRITIN: 951 ng/mL — AB (ref 24–336)

## 2016-01-01 LAB — IRON AND TIBC
Iron: 57 ug/dL (ref 45–182)
SATURATION RATIOS: 25 % (ref 17.9–39.5)
TIBC: 228 ug/dL — AB (ref 250–450)
UIBC: 171 ug/dL

## 2016-01-01 LAB — HEMOGLOBIN: Hemoglobin: 11.2 g/dL — ABNORMAL LOW (ref 13.0–17.0)

## 2016-01-01 MED ORDER — EPOETIN ALFA 10000 UNIT/ML IJ SOLN
10000.0000 [IU] | INTRAMUSCULAR | Status: DC
  Administered 2016-01-01: 10000 [IU] via SUBCUTANEOUS
  Filled 2016-01-01: qty 1

## 2016-01-03 ENCOUNTER — Ambulatory Visit (INDEPENDENT_AMBULATORY_CARE_PROVIDER_SITE_OTHER): Payer: Medicare HMO

## 2016-01-03 ENCOUNTER — Encounter: Payer: Self-pay | Admitting: Internal Medicine

## 2016-01-03 ENCOUNTER — Ambulatory Visit (INDEPENDENT_AMBULATORY_CARE_PROVIDER_SITE_OTHER): Payer: Medicare HMO | Admitting: Internal Medicine

## 2016-01-03 VITALS — BP 122/64 | HR 80 | Temp 97.5°F | Ht 76.0 in | Wt 170.4 lb

## 2016-01-03 VITALS — BP 122/64 | HR 80 | Temp 97.5°F | Ht 76.0 in | Wt 170.0 lb

## 2016-01-03 DIAGNOSIS — Z23 Encounter for immunization: Secondary | ICD-10-CM

## 2016-01-03 DIAGNOSIS — F319 Bipolar disorder, unspecified: Secondary | ICD-10-CM

## 2016-01-03 DIAGNOSIS — D631 Anemia in chronic kidney disease: Secondary | ICD-10-CM | POA: Diagnosis not present

## 2016-01-03 DIAGNOSIS — I10 Essential (primary) hypertension: Secondary | ICD-10-CM | POA: Diagnosis not present

## 2016-01-03 DIAGNOSIS — R350 Frequency of micturition: Secondary | ICD-10-CM

## 2016-01-03 DIAGNOSIS — Z0000 Encounter for general adult medical examination without abnormal findings: Secondary | ICD-10-CM | POA: Diagnosis not present

## 2016-01-03 DIAGNOSIS — N401 Enlarged prostate with lower urinary tract symptoms: Secondary | ICD-10-CM

## 2016-01-03 DIAGNOSIS — N183 Chronic kidney disease, stage 3 unspecified: Secondary | ICD-10-CM

## 2016-01-03 DIAGNOSIS — K50011 Crohn's disease of small intestine with rectal bleeding: Secondary | ICD-10-CM

## 2016-01-03 DIAGNOSIS — J8410 Pulmonary fibrosis, unspecified: Secondary | ICD-10-CM | POA: Diagnosis not present

## 2016-01-03 MED ORDER — OLANZAPINE 7.5 MG PO TABS: 7.5000 mg | ORAL_TABLET | Freq: Every day | ORAL | 11 refills | Status: DC

## 2016-01-03 NOTE — Progress Notes (Signed)
Provider:  Rexene Edison. Mariea Clonts, D.O., C.M.D. Location:   [place]   Place of Service:   clinic  Previous PCP: Hollace Kinnier, DO Patient Care Team: Gayland Curry, DO as PCP - General (Geriatric Medicine) Elmarie Shiley, MD (Nephrology) Milus Banister, MD (Gastroenterology)  Extended Emergency Contact Information Primary Emergency Contact: [facility] Address: [address]          [place], [place] [address] [name] of [place] Phone: [phone] Relation: Mother  Code Status: full code Goals of Care: Advanced Directive information Advanced Directives 01/03/2016  Does patient have an advance directive? Yes  Type of Advance Directive [place]  Does patient want to make changes to advanced directive? No - Patient declined  Copy of advanced directive(s) in chart? No - copy requested  Would patient like information on creating an advanced directive? -  Pre-existing out of facility DNR order (yellow form or pink MOST form) -    Chief Complaint  Patient presents with  . Annual Exam    physical exam    HPI: Patient is a 70 y.o. male seen today for an annual physical exam.  His mag, ca and vitamin D have been adjusted by Dr. Posey [facility] at [facility].  Got procrit last Tuesday--now goal is 12 for his hgb.    No other changes.    He is back to taking his meds regularly.  He has slowed down some.  Still visiting his mom, but doing less work at [employer].  Spirits are good.  Tries to keep a variety of things going on in his life.  He has gained 2 lbs.    BP at goal.  On 1/2 tab amlodipine now (2.47m).    Crohn's:  Reports he actually got constipated the other day.  It resolved itself.  Last remicade was 8/19.  Next is due next week.    Reports oxybutynin helps his urinary urgency from bph, but he cannot afford myrbetriq (200/month) for frequency.  No smoking :)  Says he is drinking coffee instead.  Still sleeps fine even though he urinates frequently.     Past Medical History:  Diagnosis Date  . Abdominal pain, right lower quadrant   . Abdominal pain, unspecified site   . Acute cholecystitis   . Anemia   . Anemia, unspecified   . Anxiety   . Benign paroxysmal positional vertigo   . Bipolar I disorder, most recent episode (or current) unspecified   . Celiac disease   . Cellulitis and abscess of unspecified site   . Chronic kidney disease, stage III (moderate)   . Cough   . Crohn's   . Depression   . Diarrhea   . Dizziness and giddiness   . Dizziness and giddiness   . Dizziness and giddiness   . Dysuria   . Edema   . Encounter for long-term (current) use of other medications   . Essential and other specified forms of tremor   . Essential hypertension, benign   . Hypertensive renal disease, benign   . Hypertrophy of prostate without urinary obstruction and other lower urinary tract symptoms (LUTS)   . Hypopotassemia   . Impacted cerumen   . Impotence of organic origin   . Insomnia with sleep apnea, unspecified   . Insomnia with sleep apnea, unspecified   . Iron deficiency anemia, unspecified   . Loss of weight   . Malnutrition of mild degree ([place]   . Nausea with vomiting   .  Neuralgia, neuritis, and radiculitis, unspecified   . Other abnormal blood chemistry   . Other B-complex deficiencies   . Other B-complex deficiencies   . Other extrapyramidal disease and abnormal movement disorder   . Other extrapyramidal disease and abnormal movement disorder   . Other malaise and fatigue   . Other specified disease of white blood cells   . Other specified disease of white blood cells   . Pain in joint, lower leg   . Pain in limb   . Postinflammatory pulmonary fibrosis ([place])   . Regional enteritis of small intestine ([place])   . Regional enteritis of small intestine with large intestine ([place])   . Renal insufficiency   . Tobacco use disorder   . Unspecified disorder of skin and subcutaneous tissue   . Viral warts, unspecified    . Viremia, unspecified    Past Surgical History:  Procedure Laterality Date  . CHOLECYSTECTOMY  07-12-2010  . SMALL INTESTINE SURGERY     x 2    reports that he quit smoking about 20 months ago. His smoking use included Cigarettes. He started smoking about 59 years ago. He has a 49.00 pack-year smoking history. He has never used smokeless tobacco. He reports that he does not drink alcohol or use drugs.  Functional Status Survey:    Family History  Problem Relation Age of Onset  . Diabetes Mother     maternal grandmother  . Uterine cancer Mother   . Emphysema Father   . Pneumonia Maternal Grandmother   . Colon cancer Neg Hx     Health Maintenance  Topic Date Due  . Hepatitis C Screening  1945-07-28  . ZOSTAVAX  04/11/2005  . TETANUS/TDAP  05/25/2021  . COLONOSCOPY  06/20/2025  . INFLUENZA VACCINE  Addressed  . PNA vac Low Risk Adult  Completed    Allergies  Allergen Reactions  . Azathioprine     REACTION: affected WBC  . Ciprofloxacin     Pt denies any reaction  . Levaquin [Levofloxacin In D5w]     Pt denies any reaction  . Plendil [Felodipine]     Pt denies any reaction      Medication List       Accurate as of 01/03/16  9:31 AM. Always use your most recent med list.          amLODipine 2.5 MG tablet Commonly known as:  NORVASC Take 2.5 mg by mouth daily.   Calcium Carbonate-Vit D-Min 600-400 MG-UNIT Tabs Take 1 tablet by mouth 2 (two) times daily.   cholecalciferol 1000 units tablet Commonly known as:  VITAMIN D Take 1,000 Units by mouth daily. 2 tablets once a day   divalproex 500 MG DR tablet Commonly known as:  DEPAKOTE Take 1,500 mg by mouth daily.   ferrous sulfate 325 (65 FE) MG tablet Take 325 mg by mouth 2 (two) times daily.   inFLIXimab 100 MG injection Commonly known as:  REMICADE Infuse Remicade IV schedule 1 55m/kg every 8 weeks Premedicate with Tylenol 500-656mby mouth and Benadryl 25-5032my mouth prior to infusion. Last PPD  was on 12/2009.   LOPERAMIDE A-D PO Take 1 capsule by mouth as needed (loose stools).   magnesium 30 MG tablet Take 30 mg by mouth 2 (two) times daily. 2 tablet twice a day   metoprolol succinate 25 MG 24 hr tablet Commonly known as:  TOPROL-XL Take 25 mg by mouth daily.   multivitamin tablet Take 1 tablet by mouth daily.  OLANZapine 10 MG tablet Commonly known as:  ZYPREXA Take 10 mg by mouth at bedtime.   omeprazole 20 MG capsule Commonly known as:  PRILOSEC Take 20 mg by mouth daily.   oxybutynin 15 MG 24 hr tablet Commonly known as:  DITROPAN XL Take 15 mg by mouth daily.   prazosin 5 MG capsule Commonly known as:  MINIPRESS Take 5 mg by mouth at bedtime.   PROCRIT 80034 UNIT/ML injection Generic drug:  epoetin alfa Inject 10,000 Units into the skin every 30 (thirty) days.   vitamin E 400 UNIT capsule Take 400 Units by mouth daily.       Review of Systems  Constitutional: Negative for chills, fever and malaise/fatigue.  HENT: Negative for congestion and hearing loss.   Eyes: Negative for blurred vision.       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   Cardiovascular: Negative for chest pain, palpitations and leg swelling.  Gastrointestinal: Positive for diarrhea. Negative for abdominal pain, blood in stool, constipation and melena.       No Crohn's flares  Genitourinary: Negative for dysuria.  Musculoskeletal: Negative for falls.  Skin: Negative for itching and rash.  Neurological: Negative for dizziness, loss of consciousness and weakness.  Psychiatric/Behavioral: Negative for depression and memory loss. The patient is nervous/anxious. The patient does not have insomnia.        Sleeps well with zyprexa    Vitals:   01/03/16 0900  BP: 122/64  Pulse: 80  Temp: 97.5 F (36.4 C)  TempSrc: Oral  SpO2: 97%  Weight: 170 lb (77.1 kg)  Height: 6' 4"  (1.93 m)   Body mass index is 20.69  kg/m. Physical Exam  Constitutional: He is oriented to person, place, and time. No distress.  Thin male  HENT:  Head: Normocephalic and atraumatic.  Right Ear: External ear normal.  Left Ear: External ear normal.  Nose: Nose normal.  Mouth/Throat: Oropharynx is clear and moist. No oropharyngeal exudate.  Eyes: Conjunctivae and EOM are normal. Pupils are equal, round, and reactive to light.  glasses  Neck: Neck supple. No JVD present. No thyromegaly present.  Edentulous (getting implants)  Cardiovascular: Normal rate, regular rhythm, normal heart sounds and intact distal pulses.   Pulmonary/Chest: Effort normal and breath sounds normal. No respiratory distress.  Abdominal: Soft. Bowel sounds are normal. He exhibits no distension and no mass. There is no tenderness. There is no rebound and no guarding. No hernia.  Two scars in lower abdomen from prior partial colectomies  Musculoskeletal: Normal range of motion.  Lymphadenopathy:    He has no cervical adenopathy.  Neurological: He is alert and oriented to person, place, and time. No cranial nerve deficit. Coordination normal.  Skin: Skin is warm and dry. Capillary refill takes less than 2 seconds.  Psychiatric: He has a normal mood and affect.    Labs reviewed: Basic Metabolic Panel:  Recent Labs  03/27/15 1330 05/03/15 0129 08/10/15 1322  NA 140 144 143  K 4.7 3.5 3.7  CL 106 106 103  CO2 27  --  22  GLUCOSE 102* 103* 104*  BUN 28* 20 15  CREATININE 1.93* 1.50* 1.54*  CALCIUM 8.8*  8.3*  --  8.4*  MG 1.2*  --   --   PHOS 2.1*  --   --    Liver Function Tests:  Recent Labs  03/27/15 1330 08/10/15 1322  AST  --  15  ALT  --  7  ALKPHOS  --  54  BILITOT  --  0.3  PROT  --  6.2  ALBUMIN 3.4* 3.5   No results for input(s): LIPASE, AMYLASE in the last 8760 hours. No results for input(s): AMMONIA in the last 8760 hours. CBC:  Recent Labs  03/27/15 1330  05/03/15 0107 05/03/15 0129  08/10/15 1322   11/06/15 1317 12/05/15 1253 01/01/16 1410  WBC 8.2  --  8.7  --   --  7.7  --   --   --   --   NEUTROABS  --   --  6.7  --   --  4.0  --   --   --   --   HGB 11.0*  < > 11.8* 13.9  < >  --   < > 11.7* 10.9* 11.2*  HCT 34.5*  --  36.2* 41.0  --  35.9*  --   --   --   --   MCV 103.9*  --  104.0*  --   --  99*  --   --   --   --   PLT 167  --  250  --   --  167  --   --   --   --   < > = values in this interval not displayed. Cardiac Enzymes: No results for input(s): CKTOTAL, CKMB, CKMBINDEX, TROPONINI in the last 8760 hours. BNP: Invalid input(s): POCBNP Lab Results  Component Value Date   HGBA1C 4.9 08/10/2015   Lab Results  Component Value Date   TSH 1.480 02/08/2014   Lab Results  Component Value Date   [id number] 478 07/11/2010   Lab Results  Component Value Date   FOLATE  07/11/2010    17.7 (NOTE)  Reference Ranges        Deficient:       0.4 - 3.3 ng/mL        Indeterminate:   3.4 - 5.4 ng/mL        Normal:              > 5.4 ng/mL   Lab Results  Component Value Date   IRON 57 01/01/2016   TIBC 228 (L) 01/01/2016   FERRITIN 951 (H) 01/01/2016   Assessment/Plan 1. Annual physical exam -performed today; also wellness from LPN reviewed--pt cannot get zoster while on remicaid and got hep c screening at [facility]  2. Chronic kidney disease (CKD), stage III (moderate) -cont to follow with Dr. Posey Pronto and get procrit as needed to keep hgb over 12 per nephrology -had recent adjustments to ca, D, and mag  3. Essential hypertension, benign -bp at goal with current regimen (amlodipine was reduced to 2.71m)  4. Bipolar 1 disorder ([place] -[place] depakote [place] 5. Anemia in stage 3 chronic kidney disease -cont procrit through Dr. PPosey Pronto 6. Crohn's disease of small intestine with rectal bleeding (HCC) -cont remicade injections, no recent flares, uses imodium for diarrhea  7. Postinflammatory pulmonary fibrosis (HCC) -stable, in fact  breathing and lung sounds much better since quitting smoking  8. Benign prostatic hyperplasia with urinary frequency -[place]--helps the urgency, but still goes frequently and cannot afford myrbetriq   Labs/tests ordered:  No new--[facility]   TMeadow ViewL. Ameriah Lint, D.O. [facility] [address] [place] [address]Cell Phone (Mon-Fri 8am-5pm):  [phone] On Call:  [phone] & follow prompts after 5pm & weekends Office Phone:  [phone] Office Fax:  [phone]

## 2016-01-03 NOTE — Progress Notes (Signed)
Subjective:   Brent [name] is a 70 y.o. male who presents for an Initial Medicare Annual Wellness Visit.  Review of Systems  Cardiac Risk Factors include: advanced age (>80mn, >>29women);male gender;sedentary lifestyle;smoking/ tobacco exposure    Objective:    Today's Vitals   01/03/16 0835  BP: 122/64  Pulse: 80  Temp: 97.5 F (36.4 C)  TempSrc: Oral  SpO2: 97%  Weight: 170 lb 6.4 oz (77.3 kg)  Height: 6' 4"  (1.93 m)  PainSc: 0-No pain   Body mass index is 20.74 kg/m.  Current Medications (verified) Outpatient Encounter Prescriptions as of 01/03/2016  Medication Sig  . amLODipine (NORVASC) 2.5 MG tablet Take 2.5 mg by mouth daily.  . Calcium Carbonate-Vit D-Min 600-400 MG-UNIT TABS Take 1 tablet by mouth 2 (two) times daily.    . cholecalciferol (VITAMIN D) 1000 units tablet Take 1,000 Units by mouth daily. 2 tablets once a day  . divalproex (DEPAKOTE) 500 MG EC tablet Take 1,500 mg by mouth daily.   .Marland Kitchenepoetin alfa (PROCRIT) 110626UNIT/ML injection Inject 10,000 Units into the skin every 30 (thirty) days.   . ferrous sulfate 325 (65 FE) MG tablet Take 325 mg by mouth 2 (two) times daily.    .Marland KitcheninFLIXimab (REMICADE) 100 MG injection Infuse Remicade IV schedule 1 521mkg every 8 weeks Premedicate with Tylenol 500-65040my mouth and Benadryl 25-65m39m mouth prior to infusion. Last PPD was on 12/2009.   . Loperamide HCl (LOPERAMIDE A-D PO) Take 1 capsule by mouth as needed (loose stools).   . magnesium 30 MG tablet Take 30 mg by mouth 2 (two) times daily. 2 tablet twice a day  . metoprolol succinate (TOPROL-XL) 25 MG 24 hr tablet Take 25 mg by mouth daily.    . Multiple Vitamin (MULTIVITAMIN) tablet Take 1 tablet by mouth daily.    . OLMarland KitchenNZapine (ZYPREXA) 10 MG tablet Take 10 mg by mouth at bedtime.    . omMarland Kitchenprazole (PRILOSEC) 20 MG capsule Take 20 mg by mouth daily.    . oxMarland Kitchenbutynin (DITROPAN XL) 15 MG 24 hr tablet Take 15 mg by mouth daily.    . prazosin (MINIPRESS) 5 MG  capsule Take 5 mg by mouth at bedtime.  . vitamin E 400 UNIT capsule Take 400 Units by mouth daily.    . [DISCONTINUED] amLODipine (NORVASC) 5 MG tablet Take 5 mg by mouth daily.  . [DISCONTINUED] magnesium oxide (MAG-OX) 400 MG tablet Take 400 mg by mouth 2 (two) times daily.   . [DISCONTINUED] Tamsulosin HCl (FLOMAX) 0.4 MG CAPS Take 0.4 mg by mouth daily.   No facility-administered encounter medications on file as of 01/03/2016.     Allergies (verified) Azathioprine; Ciprofloxacin; Levaquin [levofloxacin in d5w]; and Plendil [felodipine]   History: Past Medical History:  Diagnosis Date  . Abdominal pain, right lower quadrant   . Abdominal pain, unspecified site   . Acute cholecystitis   . Anemia   . Anemia, unspecified   . Anxiety   . Benign paroxysmal positional vertigo   . Bipolar I disorder, most recent episode (or current) unspecified   . Celiac disease   . Cellulitis and abscess of unspecified site   . Chronic kidney disease, stage III (moderate)   . Cough   . Crohn's   . Depression   . Diarrhea   . Dizziness and giddiness   . Dizziness and giddiness   . Dizziness and giddiness   . Dysuria   . Edema   .  Encounter for long-term (current) use of other medications   . Essential and other specified forms of tremor   . Essential hypertension, benign   . Hypertensive renal disease, benign   . Hypertrophy of prostate without urinary obstruction and other lower urinary tract symptoms (LUTS)   . Hypopotassemia   . Impacted cerumen   . Impotence of organic origin   . Insomnia with sleep apnea, unspecified   . Insomnia with sleep apnea, unspecified   . Iron deficiency anemia, unspecified   . Loss of weight   . Malnutrition of mild degree ([place])   . Nausea with vomiting   . Neuralgia, neuritis, and radiculitis, unspecified   . Other abnormal blood chemistry   . Other B-complex deficiencies   . Other B-complex deficiencies   . Other extrapyramidal disease and abnormal  movement disorder   . Other extrapyramidal disease and abnormal movement disorder   . Other malaise and fatigue   . Other specified disease of white blood cells   . Other specified disease of white blood cells   . Pain in joint, lower leg   . Pain in limb   . Postinflammatory pulmonary fibrosis ([place])   . Regional enteritis of small intestine ([place])   . 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 Renal insufficiency   . Tobacco use disorder   . Unspecified disorder of skin and subcutaneous tissue   . Viral warts, unspecified   . Viremia, unspecified    Past Surgical History:  Procedure Laterality Date  . CHOLECYSTECTOMY  07-12-2010  . SMALL INTESTINE SURGERY     x 2   Family History  Problem Relation Age of Onset  . Diabetes Mother     maternal grandmother  . Uterine cancer Mother   . Emphysema Father   . Pneumonia Maternal Grandmother   . Colon cancer Neg Hx    Social History   Occupational History  . retired   . Veteran    Social History Main Topics  . Smoking status: Former Smoker    Packs/day: 1.00    Years: 49.00    Types: Cigarettes    Start date: 04/11/1956    Quit date: 04/17/2014  . Smokeless tobacco: Never Used  . Alcohol use No  . Drug use: No  . Sexual activity: No   Tobacco Counseling Counseling given: No   Activities of Daily Living In your present state of health, do you have any difficulty performing the following activities: 01/03/2016 12/05/2015  Hearing? N N  Vision? Y N  Difficulty concentrating or making decisions? Y N  Walking or climbing stairs? N N  Dressing or bathing? N N  Doing errands, shopping? N -  Preparing Food and eating ? N -  Using the Toilet? N -  In the past six months, have you accidently leaked urine? N -  Do you have problems with loss of bowel control? Y -  Managing your Medications? N -  Managing your Finances? N -  Housekeeping or managing your Housekeeping? N -  Some recent data might be  hidden    Immunizations and Health Maintenance Immunization History  Administered Date(s) Administered  . Influenza,inj,Quad PF,36+ Mos 11/13/2014, 01/03/2016  . Influenza-Unspecified 10/25/2012  . PPD Test 12/30/2010, 01/05/2012, 01/07/2013, 01/13/2014  . Pneumococcal Conjugate-13 08/10/2014  . Pneumococcal Polysaccharide-23 03/05/2004, 01/03/2016  . Tdap 05/26/2011   Health Maintenance Due  Topic Date Due  . Hepatitis C Screening  [birth year]  . ZOSTAVAX  04/11/2005    Patient Care Team: Gayland Curry, DO as PCP - General (Geriatric Medicine) Elmarie Shiley, MD (Nephrology) Milus Banister, MD (Gastroenterology)  Indicate any recent Medical Services you may have received from other than [facility] providers in the past year (date may be approximate).    Assessment:   This is a routine wellness examination for [place].  Hearing/Vision screen  Visual Acuity Screening   Right eye Left eye Both eyes  Without correction:     With correction: 20/40-1 20/30-1 20/30-1  Comments: [facility]. 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   Dietary issues and exercise activities discussed: Current Exercise Habits: The patient does not participate in regular exercise at present, Exercise limited by: None identified  Goals    . Increase water intake          Starting 01/03/16, I will attempt to increase my water intake to 8 glasses per day.       Depression Screen PHQ 2/9 Scores 01/03/2016 08/16/2015 10/03/2014 02/09/2014  PHQ - 2 Score 0 0 0 0    Fall Risk Fall Risk  01/03/2016 08/16/2015 11/13/2014 10/03/2014 08/10/2014  Falls in the past year? No No No No No    Cognitive Function: MMSE - Mini Mental State Exam 01/03/2016  Orientation to time 5  Orientation to Place 5  Registration 3  Attention/ Calculation 4  Recall 3  Language- name 2 objects 2  Language- repeat 1  Language- follow 3 step  command 2  Language- read & follow direction 1  Write a sentence 1  Copy design 1  Total score 28        Screening Tests Health Maintenance  Topic Date Due  . Hepatitis C Screening  1945/11/01  . ZOSTAVAX  04/11/2005  . TETANUS/TDAP  05/25/2021  . COLONOSCOPY  06/20/2025  . INFLUENZA VACCINE  Addressed  . PNA vac Low Risk Adult  Completed        Plan:  I have personally reviewed and addressed the Medicare Annual Wellness questionnaire and have noted the following in the patient's chart:  A. Medical and social history B. Use of alcohol, tobacco or illicit drugs  C. Current medications and supplements D. Functional ability and status E.  Nutritional status F.  Physical activity G. Advance directives H. List of other physicians I.  Hospitalizations, surgeries, and ER visits in previous 12 months J.  [place] to include hearing, vision, cognitive, depression L. Referrals and appointments - none  In addition, I have reviewed and discussed with patient certain preventive protocols, quality metrics, and best practice recommendations. A written personalized care plan for preventive services as well as general preventive health recommendations were provided to patient.  See attached scanned questionnaire for additional information.   Signed,   Allyn Kenner, LPN Health Advisor     I reviewed health advisor's note, was available for consultation and agree with the assessment and plan as written.  He cannot take zostavax due to being on remicade and reports he may have already had hep c screening at the [place], but, if not, he will request it.  Almarosa Bohac L. Alazar Cherian, D.O. [facility] [address], [place] [address] Cell Phone (Mon-Fri 8am-5pm):  [phone] On Call:  [phone] & follow prompts after 5pm & weekends Office Phone:  [phone] Office Fax:  [phone]     

## 2016-01-03 NOTE — Patient Instructions (Signed)
[name] , Thank you for taking time to come for your Medicare Wellness Visit. I appreciate your ongoing commitment to your health goals. Please review the following plan we discussed and let me know if I can assist you in the future.   These are the goals we discussed: Goals    None      This is a list of the screening recommended for you and due dates:  Health Maintenance  Topic Date Due  .  Hepatitis C: One time screening is recommended by Center for Disease Control  (CDC) for  adults born from [birth year] through 1965.   June 13, 1945  . Shingles Vaccine  04/11/2005  . Pneumonia vaccines (2 of 2 - PPSV23) 08/10/2015  . Flu Shot  09/25/2015  . Tetanus Vaccine  05/25/2021  . Colon Cancer Screening  06/20/2025  Preventive Care for Adults  A healthy lifestyle and preventive care can promote health and wellness. Preventive health guidelines for adults include the following key practices.  . A routine yearly physical is a good way to check with your health care provider about your health and preventive screening. It is a chance to share any concerns and updates on your health and to receive a thorough exam.  . Visit your dentist for a routine exam and preventive care every 6 months. Brush your teeth twice a day and floss once a day. Good oral hygiene prevents tooth decay and gum disease.  . The frequency of eye exams is based on your age, health, family medical history, use  of contact lenses, and other factors. Follow your health care provider's ecommendations for frequency of eye exams.  . Eat a healthy diet. Foods like vegetables, fruits, whole grains, low-fat dairy products, and lean protein foods contain the nutrients you need without too many calories. Decrease your intake of foods high in solid fats, added sugars, and salt. Eat the right amount of calories for you. Get information about a proper diet from your health care provider, if necessary.  . Regular physical exercise is one of the  most important things you can do for your health. Most adults should get at least 150 minutes of moderate-intensity exercise (any activity that increases your heart rate and causes you to sweat) each week. In addition, most adults need muscle-strengthening exercises on 2 or more days a week.  Silver Sneakers may be a benefit available to you. To determine eligibility, you may visit the website: www.silversneakers.com or contact program at 913-622-4713 Mon-Fri between 8AM-8PM.   . Maintain a healthy weight. The body mass index (BMI) is a screening tool to identify possible weight problems. It provides an estimate of body fat based on height and weight. Your health care provider can find your BMI and can help you achieve or maintain a healthy weight.   For adults 20 years and older: ? A BMI below 18.5 is considered underweight. ? A BMI of 18.5 to 24.9 is normal. ? A BMI of 25 to 29.9 is considered overweight. ? A BMI of 30 and above is considered obese.   . Maintain normal blood lipids and cholesterol levels by exercising and minimizing your intake of saturated fat. Eat a balanced diet with plenty of fruit and vegetables. Blood tests for lipids and cholesterol should begin at age 63 and be repeated every 5 years. If your lipid or cholesterol levels are high, you are over 50, or you are at high risk for heart disease, you may need your cholesterol levels checked  more frequently. Ongoing high lipid and cholesterol levels should be treated with medicines if diet and exercise are not working.  . If you smoke, find out from your health care provider how to quit. If you do not use tobacco, please do not start.  . If you choose to drink alcohol, please do not consume more than 2 drinks per day. One drink is considered to be 12 ounces (355 mL) of beer, 5 ounces (148 mL) of wine, or 1.5 ounces (44 mL) of liquor.  . If you are 75-34 years old, ask your health care provider if you should take aspirin to  prevent strokes.  . Use sunscreen. Apply sunscreen liberally and repeatedly throughout the day. You should seek shade when your shadow is shorter than you. Protect yourself by wearing long sleeves, pants, a wide-brimmed hat, and sunglasses year round, whenever you are outdoors.  . Once a month, do a whole body skin exam, using a mirror to look at the skin on your back. Tell your health care provider of new moles, moles that have irregular borders, moles that are larger than a pencil eraser, or moles that have changed in shape or color.

## 2016-01-29 ENCOUNTER — Encounter (HOSPITAL_COMMUNITY): Payer: Self-pay

## 2016-01-29 ENCOUNTER — Encounter (HOSPITAL_COMMUNITY)
Admission: RE | Admit: 2016-01-29 | Discharge: 2016-01-29 | Disposition: A | Discharge status: Home / Self Care | Payer: Medicare HMO | Source: Ambulatory Visit | Attending: Gastroenterology | Admitting: Gastroenterology

## 2016-01-29 DIAGNOSIS — N183 Chronic kidney disease, stage 3 unspecified: Secondary | ICD-10-CM

## 2016-01-29 DIAGNOSIS — D6489 Other specified anemias: Secondary | ICD-10-CM | POA: Diagnosis not present

## 2016-01-29 LAB — FERRITIN: Ferritin: 795 ng/mL — ABNORMAL HIGH (ref 24–336)

## 2016-01-29 LAB — IRON AND TIBC
Iron: 72 ug/dL (ref 45–182)
SATURATION RATIOS: 31 % (ref 17.9–39.5)
TIBC: 232 ug/dL — ABNORMAL LOW (ref 250–450)
UIBC: 160 ug/dL

## 2016-01-29 LAB — HEMOGLOBIN: HEMOGLOBIN: 11.3 g/dL — AB (ref 13.0–17.0)

## 2016-01-29 MED ORDER — EPOETIN ALFA 10000 UNIT/ML IJ SOLN
10000.0000 [IU] | INTRAMUSCULAR | Status: DC
  Administered 2016-01-29: 10000 [IU] via SUBCUTANEOUS
  Filled 2016-01-29: qty 1

## 2016-02-14 ENCOUNTER — Ambulatory Visit (INDEPENDENT_AMBULATORY_CARE_PROVIDER_SITE_OTHER): Payer: Medicare HMO | Admitting: Internal Medicine

## 2016-02-14 ENCOUNTER — Encounter: Payer: Self-pay | Admitting: Internal Medicine

## 2016-02-14 VITALS — BP 148/80 | HR 70 | Temp 97.8°F | Wt 173.0 lb

## 2016-02-14 DIAGNOSIS — M19011 Primary osteoarthritis, right shoulder: Secondary | ICD-10-CM | POA: Diagnosis not present

## 2016-02-14 DIAGNOSIS — N183 Chronic kidney disease, stage 3 unspecified: Secondary | ICD-10-CM

## 2016-02-14 DIAGNOSIS — I10 Essential (primary) hypertension: Secondary | ICD-10-CM

## 2016-02-14 DIAGNOSIS — M19012 Primary osteoarthritis, left shoulder: Secondary | ICD-10-CM | POA: Diagnosis not present

## 2016-02-14 NOTE — Patient Instructions (Signed)
Take tylenol 339m 2 tabs daily as needed for shoulder pain.   You may also use warm compresses or a heating pad on a medium setting for 20 minutes up to 4 times daily if needed.

## 2016-02-14 NOTE — Progress Notes (Signed)
Location:  [facility] clinic Provider:  Bran Aldridge L. Mariea Clonts, D.O., C.M.D.  Code Status: DNR Goals of Care:  Advanced Directives 02/14/2016  Does Patient Have a Medical Advance Directive? No  Type of Advance Directive -  Does patient want to make changes to medical advance directive? No - Patient declined  Copy of [place] in Chart? -  Would patient like information on creating a medical advance directive? -  Pre-existing out of facility DNR order (yellow form or pink MOST form) -     Chief Complaint  Patient presents with  . Acute Visit    pain in both shoulders    HPI: Patient is a 70 y.o. male seen today for acute visit for shoulder pain.  It comes and goes.  Says he has "laser surgery" on one of his shoulders, but we don't have that on file.  Laying on the shoulder at night and when he has his right arm at the level of his heart, it hurts in the shoulder joint.  No known injury.  It's an achiness.  Hasn't done anything for it.  Right more bothersome now when he put his shirt back on after exam.  Two days ago, he began on increased [place]  [place].   [facility] increased his magnesium to 2 bid and 2 calcium at hs.    Past Medical History:  Diagnosis Date  . Abdominal pain, right lower quadrant   . Abdominal pain, unspecified site   . Acute cholecystitis   . Anemia   . Anemia, unspecified   . Anxiety   . Benign paroxysmal positional vertigo   . Bipolar I disorder, most recent episode (or current) unspecified   . Celiac disease   . Cellulitis and abscess of unspecified site   . Chronic kidney disease, stage III (moderate)   . Cough   . Crohn's   . Depression   . Diarrhea   . Dizziness and giddiness   . Dizziness and giddiness   . Dizziness and giddiness   . Dysuria   . Edema   . Encounter for long-term (current) use of other medications   . Essential and other specified forms of tremor   . Essential  hypertension, benign   . Hypertensive renal disease, benign   . Hypertrophy of prostate without urinary obstruction and other lower urinary tract symptoms (LUTS)   . Hypopotassemia   . Impacted cerumen   . Impotence of organic origin   . Insomnia with sleep apnea, unspecified   . Insomnia with sleep apnea, unspecified   . Iron deficiency anemia, unspecified   . Loss of weight   . Malnutrition of mild degree ([place]   . Nausea with vomiting   . Neuralgia, neuritis, and radiculitis, unspecified   . Other abnormal blood chemistry   . Other B-complex deficiencies   . Other B-complex deficiencies   . Other extrapyramidal disease and abnormal movement disorder   . Other extrapyramidal disease and abnormal movement disorder   . Other malaise and fatigue   . Other specified disease of white blood cells   . Other specified disease of white blood cells   . Pain in joint, lower leg   . Pain in limb   . Postinflammatory pulmonary fibrosis ([facility]   . Regional enteritis of small intestine ([place]   . 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 Renal insufficiency   . Tobacco use disorder   .  Unspecified disorder of skin and subcutaneous tissue   . Viral warts, unspecified   . Viremia, unspecified     Past Surgical History:  Procedure Laterality Date  . CHOLECYSTECTOMY  07-12-2010  . SMALL INTESTINE SURGERY     x 2    Allergies  Allergen Reactions  . Azathioprine     REACTION: affected WBC  . Ciprofloxacin     Pt denies any reaction  . Levaquin [Levofloxacin In D5w]     Pt denies any reaction  . Plendil [Felodipine]     Pt denies any reaction    Allergies as of 02/14/2016      Reactions   Azathioprine    REACTION: affected WBC   Ciprofloxacin    Pt denies any reaction   Levaquin [levofloxacin In D5w]    Pt denies any reaction   Plendil [felodipine]    Pt denies any reaction      Medication List       Accurate as of 02/14/16  7:46 AM. Always use  your most recent med list.          amLODipine 2.5 MG tablet Commonly known as:  NORVASC Take 2.5 mg by mouth daily.   Calcium Carbonate-Vit D-Min 600-400 MG-UNIT Tabs Take 1 tablet by mouth 2 (two) times daily.   cholecalciferol 1000 units tablet Commonly known as:  VITAMIN D Take 1,000 Units by mouth daily. 2 tablets once a day   divalproex 500 MG DR tablet Commonly known as:  DEPAKOTE Take 1,500 mg by mouth daily.   ferrous sulfate 325 (65 FE) MG tablet Take 325 mg by mouth 2 (two) times daily.   inFLIXimab 100 MG injection Commonly known as:  REMICADE Infuse Remicade IV schedule 1 31m/kg every 8 weeks Premedicate with Tylenol 500-6567mby mouth and Benadryl 25-5073my mouth prior to infusion. Last PPD was on 12/2009.   LOPERAMIDE A-D PO Take 1 capsule by mouth as needed (loose stools).   magnesium 30 MG tablet Take 60 mg by mouth 2 (two) times daily.   metoprolol succinate 50 MG 24 hr tablet Commonly known as:  TOPROL-XL Take 50 mg by mouth daily. Take with or immediately following a meal.   multivitamin tablet Take 1 tablet by mouth daily.   OLANZapine 7.5 MG tablet Commonly known as:  ZYPREXA Take 1 tablet (7.5 mg total) by mouth at bedtime.   omeprazole 20 MG capsule Commonly known as:  PRILOSEC Take 20 mg by mouth daily.   oxybutynin 15 MG 24 hr tablet Commonly known as:  DITROPAN XL Take 15 mg by mouth daily.   prazosin 5 MG capsule Commonly known as:  MINIPRESS Take 5 mg by mouth at bedtime.   PROCRIT 10054270IT/ML injection Generic drug:  epoetin alfa Inject 10,000 Units into the skin every 30 (thirty) days.   vitamin E 400 UNIT capsule Take 400 Units by mouth daily.       Review of Systems:  Review of Systems  Constitutional: Negative for chills, fever and malaise/fatigue.  HENT: Negative for congestion.   Eyes: Negative for blurred vision.       Glasses  Respiratory: Negative for cough and shortness of breath.   Cardiovascular:  Negative for chest pain, palpitations and leg swelling.  Gastrointestinal: Negative for abdominal pain, blood in stool, constipation and melena.  Genitourinary: Negative for dysuria.  Musculoskeletal: Positive for joint pain.       Bilateral shoulders  Skin: Negative for rash.  Neurological: Negative for dizziness,  loss of consciousness and weakness.  Endo/Heme/Allergies: Does not bruise/bleed easily.  Psychiatric/Behavioral: Negative for depression and memory loss.       Bipolar controlled    Health Maintenance  Topic Date Due  . Hepatitis C Screening  07/11/1945  . ZOSTAVAX  04/11/2005  . TETANUS/TDAP  05/25/2021  . COLONOSCOPY  06/20/2025  . INFLUENZA VACCINE  Addressed  . PNA vac Low Risk Adult  Completed    Physical Exam: Vitals:   02/14/16 0742  Weight: 173 lb (78.5 kg)   Body mass index is 21.06 kg/m. Physical Exam  Constitutional: He is oriented to person, place, and time. No distress.  Cardiovascular: Normal rate, regular rhythm, normal heart sounds and intact distal pulses.   Pulmonary/Chest: Effort normal and breath sounds normal. No respiratory distress.  Abdominal: Soft. Bowel sounds are normal.  Musculoskeletal: Normal range of motion.  Mild discomfort when putting his right shirt sleeve back on, no tenderness during exam to shoulder area or upper arms  Neurological: He is alert and oriented to person, place, and time.  Skin: Skin is warm and dry. Capillary refill takes less than 2 seconds.  Psychiatric: He has a normal mood and affect.    Labs reviewed: Basic Metabolic Panel:  Recent Labs  03/27/15 1330 05/03/15 0129 08/10/15 1322  NA 140 144 143  K 4.7 3.5 3.7  CL 106 106 103  CO2 27  --  22  GLUCOSE 102* 103* 104*  BUN 28* 20 15  CREATININE 1.93* 1.50* 1.54*  CALCIUM 8.8*  8.3*  --  8.4*  MG 1.2*  --   --   PHOS 2.1*  --   --    Liver Function Tests:  Recent Labs  03/27/15 1330 08/10/15 1322  AST  --  15  ALT  --  7  ALKPHOS  --   54  BILITOT  --  0.3  PROT  --  6.2  ALBUMIN 3.4* 3.5   No results for input(s): LIPASE, AMYLASE in the last 8760 hours. No results for input(s): AMMONIA in the last 8760 hours. CBC:  Recent Labs  03/27/15 1330  05/03/15 0107 05/03/15 0129  08/10/15 1322  12/05/15 1253 01/01/16 1410 01/29/16 1316  WBC 8.2  --  8.7  --   --  7.7  --   --   --   --   NEUTROABS  --   --  6.7  --   --  4.0  --   --   --   --   HGB 11.0*  < > 11.8* 13.9  < >  --   < > 10.9* 11.2* 11.3*  HCT 34.5*  --  36.2* 41.0  --  35.9*  --   --   --   --   MCV 103.9*  --  104.0*  --   --  99*  --   --   --   --   PLT 167  --  250  --   --  167  --   --   --   --   < > = values in this interval not displayed. Lipid Panel:  Recent Labs  08/10/15 1322  CHOL 137  HDL 52  LDLCALC 64  TRIG 104  CHOLHDL 2.6   Lab Results  Component Value Date   HGBA1C 4.9 08/10/2015     Assessment/Plan 1. Bilateral shoulder region arthritis -seems to be cause of his pain that comes and goes -use tylenol 665m  daily prn pain -apply heat to affected area x 20 mins qid prn pain -if worsening, becoming more frequent or unrelieved, would image shoulders next--advised to call me back if that's the case  2. Essential hypertension, benign -bp still mildly elevated this am, suspect he missed his meds this am b/c his appt was 7:30am and he planned to go back to be when he got home -cont increased dose of metoprolol succinate that was just begun  3. Chronic kidney disease (CKD), stage III (moderate) -cont updated doses of calcium and magnesium per renal  Labs/tests ordered:  No orders of the defined types were placed in this encounter.  Next appt:  07/03/2016  Mckenzie Toruno L. Velton Roselle, D.O. [facility] [address], [place] [address] Cell Phone (Mon-Fri 8am-5pm):  [phone] On Call:  [phone] & follow prompts after 5pm & weekends Office Phone:  [phone] Office Fax:   [phone]

## 2016-02-25 DIAGNOSIS — M109 Gout, unspecified: Secondary | ICD-10-CM

## 2016-02-25 DIAGNOSIS — R42 Dizziness and giddiness: Secondary | ICD-10-CM

## 2016-02-25 HISTORY — DX: Dizziness and giddiness: R42

## 2016-02-25 HISTORY — DX: Gout, unspecified: M10.9

## 2016-02-26 ENCOUNTER — Encounter (HOSPITAL_COMMUNITY)
Admission: RE | Admit: 2016-02-26 | Discharge: 2016-02-26 | Disposition: A | Discharge status: Home / Self Care | Payer: Medicare Other | Source: Ambulatory Visit | Attending: Gastroenterology | Admitting: Gastroenterology

## 2016-02-26 ENCOUNTER — Encounter (HOSPITAL_COMMUNITY): Payer: Self-pay

## 2016-02-26 DIAGNOSIS — D6489 Other specified anemias: Secondary | ICD-10-CM | POA: Diagnosis not present

## 2016-02-26 DIAGNOSIS — N183 Chronic kidney disease, stage 3 unspecified: Secondary | ICD-10-CM

## 2016-02-26 LAB — IRON AND TIBC
Iron: 64 ug/dL (ref 45–182)
Saturation Ratios: 28 % (ref 17.9–39.5)
TIBC: 231 ug/dL — AB (ref 250–450)
UIBC: 167 ug/dL

## 2016-02-26 LAB — HEMOGLOBIN: HEMOGLOBIN: 12.1 g/dL — AB (ref 13.0–17.0)

## 2016-02-26 LAB — FERRITIN: Ferritin: 1025 ng/mL — ABNORMAL HIGH (ref 24–336)

## 2016-02-26 NOTE — Progress Notes (Signed)
Hgb 12.1.  Patient did not receive  Procrit per orders.

## 2016-03-11 ENCOUNTER — Encounter: Payer: Self-pay | Admitting: Nurse Practitioner

## 2016-03-11 ENCOUNTER — Ambulatory Visit (INDEPENDENT_AMBULATORY_CARE_PROVIDER_SITE_OTHER): Payer: Medicare HMO | Admitting: Nurse Practitioner

## 2016-03-11 VITALS — BP 122/68 | HR 80 | Temp 98.1°F | Resp 19 | Ht 76.0 in | Wt 169.0 lb

## 2016-03-11 DIAGNOSIS — R05 Cough: Secondary | ICD-10-CM

## 2016-03-11 DIAGNOSIS — R059 Cough, unspecified: Secondary | ICD-10-CM

## 2016-03-11 DIAGNOSIS — M109 Gout, unspecified: Secondary | ICD-10-CM | POA: Diagnosis not present

## 2016-03-11 NOTE — Patient Instructions (Addendum)
Mucinex DM 1 tablet twice a day with a full glass of water.   We will get uric acid level to evaluate gout    Low-Purine Diet Purines are compounds that affect the level of uric acid in your body. A low-purine diet is a diet that is low in purines. Eating a low-purine diet can prevent the level of uric acid in your body from getting too high and causing gout or kidney stones or both. What do I need to know about this diet?  Choose low-purine foods. Examples of low-purine foods are listed in the next section.  Drink plenty of fluids, especially water. Fluids can help remove uric acid from your body. Try to drink 8-16 cups (1.9-3.8 L) a day.  Limit foods high in fat, especially saturated fat, as fat makes it harder for the body to get rid of uric acid. Foods high in saturated fat include pizza, cheese, ice cream, whole milk, fried foods, and gravies. Choose foods that are lower in fat and lean sources of protein. Use olive oil when cooking as it contains healthy fats that are not high in saturated fat.  Limit alcohol. Alcohol interferes with the elimination of uric acid from your body. If you are having a gout attack, avoid all alcohol.  Keep in mind that different people's bodies react differently to different foods. You will probably learn over time which foods do or do not affect you. If you discover that a food tends to cause your gout to flare up, avoid eating that food. You can more freely enjoy foods that do not cause problems. If you have any questions about a food item, talk to your dietitian or health care provider. Which foods are low, moderate, and high in purines? The following is a list of foods that are low, moderate, and high in purines. You can eat any amount of the foods that are low in purines. You may be able to have small amounts of foods that are moderate in purines. Ask your health care provider how much of a food moderate in purines you can have. Avoid foods high in  purines. Grains  Foods low in purines: Enriched white bread, pasta, rice, cake, cornbread, popcorn.  Foods moderate in purines: Whole-grain breads and cereals, wheat germ, bran, oatmeal. Uncooked oatmeal. Dry wheat bran or wheat germ.  Foods high in purines: Pancakes, [place] toast, biscuits, muffins. Vegetables  Foods low in purines: All vegetables, except those that are moderate in purines.  Foods moderate in purines: Asparagus, cauliflower, spinach, mushrooms, green peas. Fruits  All fruits are low in purines. Meats and other Protein Foods  Foods low in purines: Eggs, nuts, peanut butter.  Foods moderate in purines: 80-90% lean beef, lamb, veal, pork, poultry, fish, eggs, peanut butter, nuts. Crab, lobster, oysters, and shrimp. Cooked dried beans, peas, and lentils.  Foods high in purines: Anchovies, sardines, herring, mussels, tuna, codfish, scallops, trout, and haddock. Berniece Salines. Organ meats (such as liver or kidney). Tripe. Game meat. Goose. Sweetbreads. Dairy  All dairy foods are low in purines. Low-fat and fat-free dairy products are best because they are low in saturated fat. Beverages  Drinks low in purines: Water, carbonated beverages, tea, coffee, cocoa.  Drinks moderate in purines: Soft drinks and other drinks sweetened with high-fructose corn syrup. Juices. To find whether a food or drink is sweetened with high-fructose corn syrup, look at the ingredients list.  Drinks high in purines: Alcoholic beverages (such as beer). Condiments  Foods low in purines:  Salt, herbs, olives, pickles, relishes, vinegar.  Foods moderate in purines: Butter, margarine, oils, mayonnaise. Fats and Oils  Foods low in purines: All types, except gravies and sauces made with meat.  Foods high in purines: Gravies and sauces made with meat. Other Foods  Foods low in purines: Sugars, sweets, gelatin. Cake. Soups made without meat.  Foods moderate in purines: Meat-based or fish-based soups,  broths, or bouillons. Foods and drinks sweetened with high-fructose corn syrup.  Foods high in purines: High-fat desserts (such as ice cream, cookies, cakes, pies, doughnuts, and chocolate). Contact your dietitian for more information on foods that are not listed here.  This information is not intended to replace advice given to you by your health care provider. Make sure you discuss any questions you have with your health care provider. Document Released: 06/07/2010 Document Revised: 07/19/2015 Document Reviewed: 01/17/2013 Elsevier Interactive Patient Education  2017 [employer].

## 2016-03-11 NOTE — Progress Notes (Signed)
Careteam: Patient Care Team: Gayland Curry, DO as PCP - General (Geriatric Medicine) Elmarie Shiley, MD (Nephrology) Milus Banister, MD (Gastroenterology)  Advanced Directive information Does Patient Have a Medical Advance Directive?: Yes, Type of Advance Directive: [place];Living will  Allergies  Allergen Reactions  . Azathioprine     REACTION: affected WBC  . Ciprofloxacin     Pt denies any reaction  . Levaquin [Levofloxacin In D5w]     Pt denies any reaction  . Plendil [Felodipine]     Pt denies any reaction    Chief Complaint  Patient presents with  . Acute Visit    Bad, deep cough x 3-4 weeks. Cough is non productive. No SOB     HPI: Patient is a 71 y.o. male seen in the office today for a deep cough that he can't get rid of. Cough is non-productive and has been present for 3-4 weeks. Treatment includes [place] BP that was not effective. Also had another cough medicine that the pharmacist said was supposed to be real good but it did nothing; he does not recall the name of it. Reports he had a runny nose, sore throat, and was sneezing but those symptoms have resolved.   No history of gout. Went to [facility] hospital 3 days ago with redness, swelling, and pain of the right toe and was prescribed Colchicine which was somewhat effective. He reports his right toe was painful in the beginning and is now only sore. States the [place] did x-ray his foot and had no acute findings. No injuries to the foot.   Review of Systems:  Review of Systems  Constitutional: Negative for chills and fever.  HENT: Positive for congestion (chest). Negative for rhinorrhea, sinus pain, sinus pressure, sneezing and sore throat.   Respiratory: Positive for cough and wheezing. Negative for shortness of breath.   Cardiovascular: Negative for chest pain and palpitations.  Gastrointestinal: Positive for diarrhea (Hx of Crohn's disease). Negative for nausea and vomiting.  Musculoskeletal:  Positive for joint swelling (right great toe). Negative for myalgias.       Soreness in right great toe  Neurological: Negative for headaches.    Past Medical History:  Diagnosis Date  . Abdominal pain, right lower quadrant   . Abdominal pain, unspecified site   . Acute cholecystitis   . Anemia   . Anemia, unspecified   . Anxiety   . Benign paroxysmal positional vertigo   . Bipolar I disorder, most recent episode (or current) unspecified   . Celiac disease   . Cellulitis and abscess of unspecified site   . Chronic kidney disease, stage III (moderate)   . Cough   . Crohn's   . Depression   . Diarrhea   . Dizziness and giddiness   . Dizziness and giddiness   . Dizziness and giddiness   . Dysuria   . Edema   . Encounter for long-term (current) use of other medications   . Essential and other specified forms of tremor   . Essential hypertension, benign   . Hypertensive renal disease, benign   . Hypertrophy of prostate without urinary obstruction and other lower urinary tract symptoms (LUTS)   . Hypopotassemia   . Impacted cerumen   . Impotence of organic origin   . Insomnia with sleep apnea, unspecified   . Insomnia with sleep apnea, unspecified   . Iron deficiency anemia, unspecified   . Loss of weight   . Malnutrition of mild degree (  HCC)   . Nausea with vomiting   . Neuralgia, neuritis, and radiculitis, unspecified   . Other abnormal blood chemistry   . Other B-complex deficiencies   . Other B-complex deficiencies   . Other extrapyramidal disease and abnormal movement disorder   . Other extrapyramidal disease and abnormal movement disorder   . Other malaise and fatigue   . Other specified disease of white blood cells   . Other specified disease of white blood cells   . Pain in joint, lower leg   . Pain in limb   . Postinflammatory pulmonary fibrosis ([place])   . 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 Renal insufficiency   . Tobacco use disorder   . Unspecified disorder of skin and subcutaneous tissue   . Viral warts, unspecified   . Viremia, unspecified    Past Surgical History:  Procedure Laterality Date  . CHOLECYSTECTOMY  07-12-2010  . SMALL INTESTINE SURGERY     x 2   Social History:   reports that he quit smoking about 22 months ago. His smoking use included Cigarettes. He started smoking about 59 years ago. He has a 49.00 pack-year smoking history. He has never used smokeless tobacco. He reports that he does not drink alcohol or use drugs.  Family History  Problem Relation Age of Onset  . Diabetes Mother     maternal grandmother  . Uterine cancer Mother   . Emphysema Father   . Pneumonia Maternal Grandmother   . Colon cancer Neg Hx     Medications: Patient's Medications  New Prescriptions   No medications on file  Previous Medications   AMLODIPINE (NORVASC) 2.5 MG TABLET    Take 2.5 mg by mouth daily.   CALCIUM CARBONATE-VIT D-MIN 600-400 MG-UNIT TABS    Take 1 tablet by mouth 2 (two) times daily.     CHOLECALCIFEROL (VITAMIN D) 1000 UNITS TABLET    Take 1,000 Units by mouth daily. 2 tablets once a day   DIVALPROEX (DEPAKOTE) 500 MG EC TABLET    Take 1,500 mg by mouth daily.    EPOETIN ALFA (PROCRIT) 62694 UNIT/ML INJECTION    Inject 10,000 Units into the skin every 30 (thirty) days.    FERROUS SULFATE 325 (65 FE) MG TABLET    Take 325 mg by mouth 2 (two) times daily.     INFLIXIMAB (REMICADE) 100 MG INJECTION    Infuse Remicade IV schedule 1 81m/kg every 8 weeks Premedicate with Tylenol 500-6519mby mouth and Benadryl 25-5051my mouth prior to infusion. Last PPD was on 12/2009.    LOPERAMIDE HCL (LOPERAMIDE A-D PO)    Take 1 capsule by mouth as needed (loose stools).    MAGNESIUM 30 MG TABLET    Take 60 mg by mouth 2 (two) times daily.    METOPROLOL SUCCINATE (TOPROL-XL) 50 MG 24 HR TABLET    Take 50 mg by mouth daily. Take with or immediately following  a meal.   MULTIPLE VITAMIN (MULTIVITAMIN) TABLET    Take 1 tablet by mouth daily.     OLANZAPINE (ZYPREXA) 7.5 MG TABLET    Take 1 tablet (7.5 mg total) by mouth at bedtime.   OMEPRAZOLE (PRILOSEC) 20 MG CAPSULE    Take 20 mg by mouth daily.     OXYBUTYNIN (DITROPAN XL) 15 MG 24 HR TABLET    Take 15 mg by mouth daily.  PRAZOSIN (MINIPRESS) 5 MG CAPSULE    Take 5 mg by mouth at bedtime.   VITAMIN E 400 UNIT CAPSULE    Take 400 Units by mouth daily.    Modified Medications   No medications on file  Discontinued Medications   No medications on file     Physical Exam:  Vitals:   03/11/16 1452  BP: 122/68  Pulse: 80  Resp: 19  Temp: 98.1 F (36.7 C)  TempSrc: Oral  SpO2: 96%  Weight: 169 lb (76.7 kg)  Height: 6' 4"  (1.93 m)   Body mass index is 20.57 kg/m.  Physical Exam  Constitutional: He is oriented to person, place, and time. He appears well-developed and well-nourished.  HENT:  Head: Normocephalic and atraumatic.  Eyes: Conjunctivae are normal. Pupils are equal, round, and reactive to light.  Neck: Normal range of motion. Neck supple.  Cardiovascular: Normal rate, regular rhythm, normal heart sounds and intact distal pulses.   Pulmonary/Chest: Effort normal and breath sounds normal.  Musculoskeletal: Normal range of motion. He exhibits edema (non-pitting right foot) and tenderness (palpation to base of right great toe).  Neurological: He is alert and oriented to person, place, and time.  Skin: Skin is warm and dry.  Redness & edema to right foot.  Psychiatric: He has a normal mood and affect. His behavior is normal. Judgment and thought content normal.    Labs reviewed: Basic Metabolic Panel:  Recent Labs  03/27/15 1330 05/03/15 0129 08/10/15 1322  NA 140 144 143  K 4.7 3.5 3.7  CL 106 106 103  CO2 27  --  22  GLUCOSE 102* 103* 104*  BUN 28* 20 15  CREATININE 1.93* 1.50* 1.54*  CALCIUM 8.8*  8.3*  --  8.4*  MG 1.2*  --   --   PHOS 2.1*  --   --     Liver Function Tests:  Recent Labs  03/27/15 1330 08/10/15 1322  AST  --  15  ALT  --  7  ALKPHOS  --  54  BILITOT  --  0.3  PROT  --  6.2  ALBUMIN 3.4* 3.5   No results for input(s): LIPASE, AMYLASE in the last 8760 hours. No results for input(s): AMMONIA in the last 8760 hours. CBC:  Recent Labs  03/27/15 1330  05/03/15 0107 05/03/15 0129  08/10/15 1322  01/01/16 1410 01/29/16 1316 02/26/16 1249  WBC 8.2  --  8.7  --   --  7.7  --   --   --   --   NEUTROABS  --   --  6.7  --   --  4.0  --   --   --   --   HGB 11.0*  < > 11.8* 13.9  < >  --   < > 11.2* 11.3* 12.1*  HCT 34.5*  --  36.2* 41.0  --  35.9*  --   --   --   --   MCV 103.9*  --  104.0*  --   --  99*  --   --   --   --   PLT 167  --  250  --   --  167  --   --   --   --   < > = values in this interval not displayed. Lipid Panel:  Recent Labs  08/10/15 1322  CHOL 137  HDL 52  LDLCALC 64  TRIG 104  CHOLHDL 2.6   TSH: No results  for input(s): TSH in the last 8760 hours. A1C: Lab Results  Component Value Date   HGBA1C 4.9 08/10/2015     Assessment/Plan 1. Acute gout involving toe of right foot, unspecified cause - Recently completed 3 tablets of Colchicine prescribed by the [facility] hospital on 1/13. Having some diarrhea, also has Crohn's disease. Right great toe pain has resolved to soreness.  - Education provided on a low-purine diet.  - Uric Acid  - Will consider adding Allopurinol depending on uric acid results.  - Advised to call if swelling, pain, or redness worsens.  2. Cough - Recent URI. Reassurance provided. Educated that the cough of a URI can last up to 6 wks.  - Mucinex DM 1 tablet BID with a full glass of water. - Increase fluid intake and good nutrition.    Follow-up as needed.   Carlos American. Harle Battiest  [facility] [phone] 8 am - 5 pm) [phone] (after hours)

## 2016-03-12 ENCOUNTER — Telehealth: Payer: Self-pay | Admitting: *Deleted

## 2016-03-12 DIAGNOSIS — E790 Hyperuricemia without signs of inflammatory arthritis and tophaceous disease: Secondary | ICD-10-CM

## 2016-03-12 LAB — URIC ACID: URIC ACID, SERUM: 9.4 mg/dL — AB (ref 4.0–8.0)

## 2016-03-12 MED ORDER — ALLOPURINOL 100 MG PO TABS: 100.0000 mg | ORAL_TABLET | Freq: Every day | ORAL | 3 refills | Status: DC

## 2016-03-12 NOTE — Telephone Encounter (Signed)
-----   Message from Lauree Chandler, NP sent at 03/12/2016 11:00 AM EST ----- Elevated uric acid level, pt to start allopurinol 100 mg daily (please send to pharmacy of choice) and have pt follow up in 4 weeks for follow up uric acid level

## 2016-03-12 NOTE — Telephone Encounter (Signed)
Patient notified and agreed. Medication list updated and medication faxed to pharmacy. Lab scheduled for 04/09/16 for repeat Uric Acid. Order placed.

## 2016-03-18 DIAGNOSIS — D631 Anemia in chronic kidney disease: Secondary | ICD-10-CM | POA: Diagnosis not present

## 2016-03-18 DIAGNOSIS — D509 Iron deficiency anemia, unspecified: Secondary | ICD-10-CM | POA: Diagnosis not present

## 2016-03-18 DIAGNOSIS — I10 Essential (primary) hypertension: Secondary | ICD-10-CM | POA: Diagnosis not present

## 2016-03-18 DIAGNOSIS — N183 Chronic kidney disease, stage 3 (moderate): Secondary | ICD-10-CM | POA: Diagnosis not present

## 2016-03-18 DIAGNOSIS — N2581 Secondary hyperparathyroidism of renal origin: Secondary | ICD-10-CM | POA: Diagnosis not present

## 2016-03-18 DIAGNOSIS — I129 Hypertensive chronic kidney disease with stage 1 through stage 4 chronic kidney disease, or unspecified chronic kidney disease: Secondary | ICD-10-CM | POA: Diagnosis not present

## 2016-03-26 ENCOUNTER — Encounter (HOSPITAL_COMMUNITY)
Admission: RE | Admit: 2016-03-26 | Discharge: 2016-03-26 | Disposition: A | Discharge status: Home / Self Care | Payer: Medicare Other | Source: Ambulatory Visit | Attending: Nephrology | Admitting: Nephrology

## 2016-03-26 ENCOUNTER — Encounter (HOSPITAL_COMMUNITY): Payer: Self-pay

## 2016-03-26 DIAGNOSIS — N183 Chronic kidney disease, stage 3 unspecified: Secondary | ICD-10-CM

## 2016-03-26 DIAGNOSIS — D6489 Other specified anemias: Secondary | ICD-10-CM | POA: Diagnosis not present

## 2016-03-26 LAB — HEMOGLOBIN: HEMOGLOBIN: 10.4 g/dL — AB (ref 13.0–17.0)

## 2016-03-26 LAB — IRON AND TIBC
IRON: 68 ug/dL (ref 45–182)
SATURATION RATIOS: 30 % (ref 17.9–39.5)
TIBC: 225 ug/dL — AB (ref 250–450)
UIBC: 157 ug/dL

## 2016-03-26 LAB — FERRITIN: FERRITIN: 962 ng/mL — AB (ref 24–336)

## 2016-03-26 MED ORDER — EPOETIN ALFA 10000 UNIT/ML IJ SOLN
10000.0000 [IU] | INTRAMUSCULAR | Status: DC
  Administered 2016-03-26: 10000 [IU] via SUBCUTANEOUS
  Filled 2016-03-26: qty 1

## 2016-03-26 NOTE — Discharge Instructions (Signed)
Epoetin Alfa injection °What is this medicine? °EPOETIN ALFA (e POE e tin AL fa) helps your body make more red blood cells. This medicine is used to treat anemia caused by chronic kidney failure, cancer chemotherapy, or HIV-therapy. It may also be used before surgery if you have anemia. °This medicine may be used for other purposes; ask your health care provider or pharmacist if you have questions. °COMMON BRAND NAME(S): Epogen, Procrit °What should I tell my health care provider before I take this medicine? °They need to know if you have any of these conditions: °-blood clotting disorders °-cancer patient not on chemotherapy °-cystic fibrosis °-heart disease, such as angina or heart failure °-hemoglobin level of 12 g/dL or greater °-high blood pressure °-low levels of folate, iron, or vitamin B12 °-seizures °-an unusual or allergic reaction to erythropoietin, albumin, benzyl alcohol, hamster proteins, other medicines, foods, dyes, or preservatives °-pregnant or trying to get pregnant °-breast-feeding °How should I use this medicine? °This medicine is for injection into a vein or under the skin. It is usually given by a health care professional in a hospital or clinic setting. °If you get this medicine at home, you will be taught how to prepare and give this medicine. Use exactly as directed. Take your medicine at regular intervals. Do not take your medicine more often than directed. °It is important that you put your used needles and syringes in a special sharps container. Do not put them in a trash can. If you do not have a sharps container, call your pharmacist or healthcare provider to get one. °A special MedGuide will be given to you by the pharmacist with each prescription and refill. Be sure to read this information carefully each time. °Talk to your pediatrician regarding the use of this medicine in children. While this drug may be prescribed for selected conditions, precautions do apply. °Overdosage: If you  think you have taken too much of this medicine contact a poison control center or emergency room at once. °NOTE: This medicine is only for you. Do not share this medicine with others. °What if I miss a dose? °If you miss a dose, take it as soon as you can. If it is almost time for your next dose, take only that dose. Do not take double or extra doses. °What may interact with this medicine? °Do not take this medicine with any of the following medications: °-darbepoetin alfa °This list may not describe all possible interactions. Give your health care provider a list of all the medicines, herbs, non-prescription drugs, or dietary supplements you use. Also tell them if you smoke, drink alcohol, or use illegal drugs. Some items may interact with your medicine. °What should I watch for while using this medicine? °Your condition will be monitored carefully while you are receiving this medicine. °You may need blood work done while you are taking this medicine. °What side effects may I notice from receiving this medicine? °Side effects that you should report to your doctor or health care professional as soon as possible: °-allergic reactions like skin rash, itching or hives, swelling of the face, lips, or tongue °-breathing problems °-changes in vision °-chest pain °-confusion, trouble speaking or understanding °-feeling faint or lightheaded, falls °-high blood pressure °-muscle aches or pains °-pain, swelling, warmth in the leg °-rapid weight gain °-severe headaches °-sudden numbness or weakness of the face, arm or leg °-trouble walking, dizziness, loss of balance or coordination °-seizures (convulsions) °-swelling of the ankles, feet, hands °-unusually weak or tired °  Side effects that usually do not require medical attention (report to your doctor or health care professional if they continue or are bothersome): °-diarrhea °-fever, chills (flu-like symptoms) °-headaches °-nausea, vomiting °-redness, stinging, or swelling at  site where injected °This list may not describe all possible side effects. Call your doctor for medical advice about side effects. You may report side effects to FDA at 1-800-FDA-1088. °Where should I keep my medicine? °Keep out of the reach of children. °Store in a refrigerator between 2 and 8 degrees C (36 and 46 degrees F). Do not freeze or shake. Throw away any unused portion if using a single-dose vial. Multi-dose vials can be kept in the refrigerator for up to 21 days after the initial dose. Throw away unused medicine. °NOTE: This sheet is a summary. It may not cover all possible information. If you have questions about this medicine, talk to your doctor, pharmacist, or health care provider. °© 2017 Elsevier/[employer] Standard (2015-10-01 19:42:31) ° °

## 2016-04-09 ENCOUNTER — Other Ambulatory Visit: Payer: Self-pay

## 2016-04-09 ENCOUNTER — Other Ambulatory Visit: Payer: Medicare Other

## 2016-04-09 DIAGNOSIS — E790 Hyperuricemia without signs of inflammatory arthritis and tophaceous disease: Secondary | ICD-10-CM | POA: Diagnosis not present

## 2016-04-10 ENCOUNTER — Telehealth: Payer: Self-pay

## 2016-04-10 LAB — URIC ACID: Uric Acid, Serum: 8.9 mg/dL — ABNORMAL HIGH (ref 4.0–8.0)

## 2016-04-10 MED ORDER — ALLOPURINOL 100 MG PO TABS: 200.0000 mg | ORAL_TABLET | Freq: Every day | ORAL | 5 refills | Status: DC

## 2016-04-10 NOTE — Telephone Encounter (Signed)
-----   Message from Lauree Chandler, NP sent at 04/10/2016 10:27 AM EST ----- Uric acid remains elevated, to increase allopurinol to 200 mg daily

## 2016-04-10 NOTE — Telephone Encounter (Signed)
Discussed results with patient, patient verbalized understanding of results  New RX sent to [facility] of labs mailed

## 2016-04-24 ENCOUNTER — Encounter (HOSPITAL_COMMUNITY): Payer: Medicare Other

## 2016-04-28 ENCOUNTER — Encounter (HOSPITAL_COMMUNITY)
Admission: RE | Admit: 2016-04-28 | Payer: Medicare Other | Source: Ambulatory Visit | Attending: Nephrology | Admitting: Nephrology

## 2016-05-05 ENCOUNTER — Encounter (HOSPITAL_COMMUNITY)
Admission: RE | Admit: 2016-05-05 | Discharge: 2016-05-05 | Disposition: A | Discharge status: Home / Self Care | Payer: Medicare Other | Source: Ambulatory Visit | Attending: Gastroenterology | Admitting: Gastroenterology

## 2016-05-05 ENCOUNTER — Encounter (HOSPITAL_COMMUNITY): Payer: Self-pay

## 2016-05-05 DIAGNOSIS — D6489 Other specified anemias: Secondary | ICD-10-CM | POA: Diagnosis not present

## 2016-05-05 DIAGNOSIS — N183 Chronic kidney disease, stage 3 unspecified: Secondary | ICD-10-CM

## 2016-05-05 LAB — IRON AND TIBC
Iron: 78 ug/dL (ref 45–182)
Saturation Ratios: 34 % (ref 17.9–39.5)
TIBC: 228 ug/dL — ABNORMAL LOW (ref 250–450)
UIBC: 150 ug/dL

## 2016-05-05 LAB — HEMOGLOBIN: Hemoglobin: 11 g/dL — ABNORMAL LOW (ref 13.0–17.0)

## 2016-05-05 LAB — FERRITIN: Ferritin: 991 ng/mL — ABNORMAL HIGH (ref 24–336)

## 2016-05-05 MED ORDER — EPOETIN ALFA 10000 UNIT/ML IJ SOLN
10000.0000 [IU] | INTRAMUSCULAR | Status: DC
  Administered 2016-05-05: 10000 [IU] via SUBCUTANEOUS
  Filled 2016-05-05: qty 1

## 2016-05-05 NOTE — Progress Notes (Signed)
hgb 11.0 today, procrit 10,000 units given SQ to rt upper arm.  Pt d/c ambulatroy to lobby. Pt has next 2 appointments.

## 2016-05-22 ENCOUNTER — Encounter (HOSPITAL_COMMUNITY): Payer: Medicare Other

## 2016-05-26 ENCOUNTER — Encounter (HOSPITAL_COMMUNITY): Payer: Medicare Other

## 2016-06-02 ENCOUNTER — Encounter (HOSPITAL_COMMUNITY)
Admission: RE | Admit: 2016-06-02 | Discharge: 2016-06-02 | Disposition: A | Discharge status: Home / Self Care | Payer: Medicare Other | Source: Ambulatory Visit | Attending: Gastroenterology | Admitting: Gastroenterology

## 2016-06-02 ENCOUNTER — Encounter (HOSPITAL_COMMUNITY): Payer: Self-pay

## 2016-06-02 DIAGNOSIS — N183 Chronic kidney disease, stage 3 unspecified: Secondary | ICD-10-CM

## 2016-06-02 DIAGNOSIS — D6489 Other specified anemias: Secondary | ICD-10-CM | POA: Insufficient documentation

## 2016-06-02 HISTORY — DX: Dizziness and giddiness: R42

## 2016-06-02 HISTORY — DX: Gout, unspecified: M10.9

## 2016-06-02 LAB — HEMOGLOBIN: HEMOGLOBIN: 11.3 g/dL — AB (ref 13.0–17.0)

## 2016-06-02 MED ORDER — EPOETIN ALFA 10000 UNIT/ML IJ SOLN
10000.0000 [IU] | INTRAMUSCULAR | Status: DC
  Administered 2016-06-02: 10000 [IU] via SUBCUTANEOUS
  Filled 2016-06-02: qty 1

## 2016-06-02 NOTE — Discharge Instructions (Signed)
Procrit Epoetin Alfa injection What is this medicine? EPOETIN ALFA (e POE e tin AL fa) helps your body make more red blood cells. This medicine is used to treat anemia caused by chronic kidney failure, cancer chemotherapy, or HIV-therapy. It may also be used before surgery if you have anemia. This medicine may be used for other purposes; ask your health care provider or pharmacist if you have questions. COMMON BRAND NAME(S): Epogen, Procrit What should I tell my health care provider before I take this medicine? They need to know if you have any of these conditions: -blood clotting disorders -cancer patient not on chemotherapy -cystic fibrosis -heart disease, such as angina or heart failure -hemoglobin level of 12 g/dL or greater -high blood pressure -low levels of folate, iron, or vitamin B12 -seizures -an unusual or allergic reaction to erythropoietin, albumin, benzyl alcohol, hamster proteins, other medicines, foods, dyes, or preservatives -pregnant or trying to get pregnant -breast-feeding How should I use this medicine? This medicine is for injection into a vein or under the skin. It is usually given by a health care professional in a hospital or clinic setting. If you get this medicine at home, you will be taught how to prepare and give this medicine. Use exactly as directed. Take your medicine at regular intervals. Do not take your medicine more often than directed. It is important that you put your used needles and syringes in a special sharps container. Do not put them in a trash can. If you do not have a sharps container, call your pharmacist or healthcare provider to get one. A special MedGuide will be given to you by the pharmacist with each prescription and refill. Be sure to read this information carefully each time. Talk to your pediatrician regarding the use of this medicine in children. While this drug may be prescribed for selected conditions, precautions do  apply. Overdosage: If you think you have taken too much of this medicine contact a poison control center or emergency room at once. NOTE: This medicine is only for you. Do not share this medicine with others. What if I miss a dose? If you miss a dose, take it as soon as you can. If it is almost time for your next dose, take only that dose. Do not take double or extra doses. What may interact with this medicine? Do not take this medicine with any of the following medications: -darbepoetin alfa This list may not describe all possible interactions. Give your health care provider a list of all the medicines, herbs, non-prescription drugs, or dietary supplements you use. Also tell them if you smoke, drink alcohol, or use illegal drugs. Some items may interact with your medicine. What should I watch for while using this medicine? Your condition will be monitored carefully while you are receiving this medicine. You may need blood work done while you are taking this medicine. What side effects may I notice from receiving this medicine? Side effects that you should report to your doctor or health care professional as soon as possible: -allergic reactions like skin rash, itching or hives, swelling of the face, lips, or tongue -breathing problems -changes in vision -chest pain -confusion, trouble speaking or understanding -feeling faint or lightheaded, falls -high blood pressure -muscle aches or pains -pain, swelling, warmth in the leg -rapid weight gain -severe headaches -sudden numbness or weakness of the face, arm or leg -trouble walking, dizziness, loss of balance or coordination -seizures (convulsions) -swelling of the ankles, feet, hands -unusually weak  or tired Side effects that usually do not require medical attention (report to your doctor or health care professional if they continue or are bothersome): -diarrhea -fever, chills (flu-like symptoms) -headaches -nausea,  vomiting -redness, stinging, or swelling at site where injected This list may not describe all possible side effects. Call your doctor for medical advice about side effects. You may report side effects to FDA at 1-800-FDA-1088. Where should I keep my medicine? Keep out of the reach of children. Store in a refrigerator between 2 and 8 degrees C (36 and 46 degrees F). Do not freeze or shake. Throw away any unused portion if using a single-dose vial. Multi-dose vials can be kept in the refrigerator for up to 21 days after the initial dose. Throw away unused medicine. NOTE: This sheet is a summary. It may not cover all possible information. If you have questions about this medicine, talk to your doctor, pharmacist, or health care provider.  2018 Elsevier/Gold Standard (2015-10-01 19:42:31)

## 2016-06-10 DIAGNOSIS — Z125 Encounter for screening for malignant neoplasm of prostate: Secondary | ICD-10-CM | POA: Diagnosis not present

## 2016-06-19 ENCOUNTER — Encounter (HOSPITAL_COMMUNITY): Payer: Medicare Other

## 2016-06-23 ENCOUNTER — Encounter (HOSPITAL_COMMUNITY): Payer: Medicare Other

## 2016-06-30 ENCOUNTER — Encounter (HOSPITAL_COMMUNITY): Payer: Self-pay

## 2016-06-30 ENCOUNTER — Encounter (HOSPITAL_COMMUNITY)
Admission: RE | Admit: 2016-06-30 | Discharge: 2016-06-30 | Disposition: A | Discharge status: Home / Self Care | Payer: Medicare Other | Source: Ambulatory Visit | Attending: Gastroenterology | Admitting: Gastroenterology

## 2016-06-30 DIAGNOSIS — N183 Chronic kidney disease, stage 3 unspecified: Secondary | ICD-10-CM

## 2016-06-30 DIAGNOSIS — D6489 Other specified anemias: Secondary | ICD-10-CM | POA: Insufficient documentation

## 2016-06-30 LAB — IRON AND TIBC
Iron: 87 ug/dL (ref 45–182)
Saturation Ratios: 37 % (ref 17.9–39.5)
TIBC: 232 ug/dL — ABNORMAL LOW (ref 250–450)
UIBC: 145 ug/dL

## 2016-06-30 LAB — FERRITIN: Ferritin: 1185 ng/mL — ABNORMAL HIGH (ref 24–336)

## 2016-06-30 LAB — HEMOGLOBIN: HEMOGLOBIN: 12.3 g/dL — AB (ref 13.0–17.0)

## 2016-06-30 NOTE — Progress Notes (Signed)
Pt here for labwork and possible procrit injection based on labs.  Hgb 12.3 today.  Procrit not needed.  Pt informed and he voiced his happiness.  He was d/c ambulatory to lobby.  Pt was given his next appointments through august 2018.

## 2016-07-03 ENCOUNTER — Encounter: Payer: Self-pay | Admitting: Internal Medicine

## 2016-07-03 ENCOUNTER — Encounter: Payer: Self-pay | Admitting: *Deleted

## 2016-07-03 ENCOUNTER — Ambulatory Visit (INDEPENDENT_AMBULATORY_CARE_PROVIDER_SITE_OTHER): Payer: Medicare Other | Admitting: Internal Medicine

## 2016-07-03 VITALS — BP 122/70 | HR 67 | Temp 98.6°F | Wt 178.0 lb

## 2016-07-03 DIAGNOSIS — F319 Bipolar disorder, unspecified: Secondary | ICD-10-CM | POA: Diagnosis not present

## 2016-07-03 DIAGNOSIS — M10071 Idiopathic gout, right ankle and foot: Secondary | ICD-10-CM

## 2016-07-03 DIAGNOSIS — K50011 Crohn's disease of small intestine with rectal bleeding: Secondary | ICD-10-CM | POA: Diagnosis not present

## 2016-07-03 DIAGNOSIS — I10 Essential (primary) hypertension: Secondary | ICD-10-CM

## 2016-07-03 DIAGNOSIS — R42 Dizziness and giddiness: Secondary | ICD-10-CM | POA: Diagnosis not present

## 2016-07-03 DIAGNOSIS — N183 Chronic kidney disease, stage 3 unspecified: Secondary | ICD-10-CM

## 2016-07-03 DIAGNOSIS — D631 Anemia in chronic kidney disease: Secondary | ICD-10-CM | POA: Diagnosis not present

## 2016-07-03 NOTE — Progress Notes (Signed)
Location:  [facility] clinic Provider:  Ruqayya Ventress L. Mariea Clonts, D.O., C.M.D.  Code Status: DNR Goals of Care:  Advanced Directives 03/11/2016  Does Patient Have a Medical Advance Directive? Yes  Type of Paramedic of [place];Living will  Does patient want to make changes to medical advance directive? -  Copy of [place] in Chart? Yes  Would patient like information on creating a medical advance directive? -  Pre-existing out of facility DNR order (yellow form or pink MOST form) -     Chief Complaint  Patient presents with  . Medical Management of Chronic Issues    [facility] follow-up    HPI: Patient is a 71y.o. male seen today for medical management of chronic diseases.    Had vertigo and gout.    Got the gout first after cooking beef and sandwiches.  Big toe on right foot.  All better now.  Is on allopurinol now.  Took colcrys for the acute flare--only needed 3 and it knocked it out.    [place]  sometimes happening when he stands.  Takes the meclizine as needed for his spells--helps.    BP doing well.    Crohn's in its usual state.  [place]   Bilateral shoulder pain better with some saves from the [employer].  Worst at night when he tries to go to sleep.  Past Medical History:  Diagnosis Date  . Abdominal pain, right lower quadrant   . Abdominal pain, unspecified site   . Acute cholecystitis   . Anemia   . Anemia, unspecified   . Anxiety   . Benign paroxysmal positional vertigo   . Bipolar I disorder, most recent episode (or current) unspecified   . Celiac disease   . Cellulitis and abscess of unspecified site   . Chronic kidney disease, stage III (moderate)   . Cough   . Crohn's   . Depression   . Diarrhea   . Dizziness and giddiness   . Dizziness and giddiness   . Dizziness and giddiness   . Dysuria   . Edema   . Encounter for long-term (current) use of other  medications   . Essential and other specified forms of tremor   . Essential hypertension, benign   . Gout 2018  . Hypertensive renal disease, benign   . Hypertrophy of prostate without urinary obstruction and other lower urinary tract symptoms (LUTS)   . Hypopotassemia   . Impacted cerumen   . Impotence of organic origin   . Insomnia with sleep apnea, unspecified   . Insomnia with sleep apnea, unspecified   . Iron deficiency anemia, unspecified   . Loss of weight   . Malnutrition of mild degree ([place]   . Nausea with vomiting   . Neuralgia, neuritis, and radiculitis, unspecified   . Other abnormal blood chemistry   . Other B-complex deficiencies   . Other B-complex deficiencies   . Other extrapyramidal disease and abnormal movement disorder   . Other extrapyramidal disease and abnormal movement disorder   . Other malaise and fatigue   . Other specified disease of white blood cells   . Other specified disease of white blood cells   . Pain in joint, lower leg   . Pain in limb   . Postinflammatory pulmonary fibrosis ([place]   . Regional enteritis of small intestine ([place]   . Regional enteritis of small intestine  with large intestine ([place])   . Renal insufficiency   . Tobacco use disorder   . Unspecified disorder of skin and subcutaneous tissue   . Vertigo 2018  . Viral warts, unspecified   . Viremia, unspecified     Past Surgical History:  Procedure Laterality Date  . CHOLECYSTECTOMY  07-12-2010  . SMALL INTESTINE SURGERY     x 2    Allergies  Allergen Reactions  . Azathioprine     REACTION: affected WBC  . Ciprofloxacin     Pt denies any reaction  . Levaquin [Levofloxacin In D5w]     Pt denies any reaction  . Plendil [Felodipine]     Pt denies any reaction    Allergies as of 07/03/2016      Reactions   Azathioprine    REACTION: affected WBC   Ciprofloxacin    Pt denies any reaction   Levaquin [levofloxacin In D5w]    Pt denies any reaction   Plendil  [felodipine]    Pt denies any reaction      Medication List       Accurate as of 07/03/16  2:54 PM. Always use your most recent med list.          allopurinol 100 MG tablet Commonly known as:  ZYLOPRIM Take 2 tablets (200 mg total) by mouth daily.   amLODipine 2.5 MG tablet Commonly known as:  NORVASC Take 2.5 mg by mouth daily.   Calcium Carbonate-Vit D-Min 600-400 MG-UNIT Tabs Take 1 tablet by mouth 2 (two) times daily.   cholecalciferol 1000 units tablet Commonly known as:  VITAMIN D Take 1,000 Units by mouth daily. 2 tablets once a day   divalproex 500 MG DR tablet Commonly known as:  DEPAKOTE Take 1,500 mg by mouth daily.   ferrous sulfate 325 (65 FE) MG tablet Take 325 mg by mouth 2 (two) times daily.   inFLIXimab 100 MG injection Commonly known as:  REMICADE Infuse Remicade IV schedule 1 58m/kg every 8 weeks Premedicate with Tylenol 500-6559mby mouth and Benadryl 25-5046my mouth prior to infusion. Last PPD was on 12/2009.   LOPERAMIDE A-D PO Take 1 capsule by mouth as needed (loose stools).   magnesium 30 MG tablet Take 60 mg by mouth 2 (two) times daily.   meclizine 25 MG tablet Commonly known as:  ANTIVERT Take 25 mg by mouth 3 (three) times daily as needed.   metoprolol succinate 50 MG 24 hr tablet Commonly known as:  TOPROL-XL Take 50 mg by mouth daily. Take with or immediately following a meal.   multivitamin tablet Take 1 tablet by mouth daily.   OLANZapine 7.5 MG tablet Commonly known as:  ZYPREXA Take 1 tablet (7.5 mg total) by mouth at bedtime.   omeprazole 20 MG capsule Commonly known as:  PRILOSEC Take 20 mg by mouth daily.   oxybutynin 15 MG 24 hr tablet Commonly known as:  DITROPAN XL Take 15 mg by mouth daily.   prazosin 5 MG capsule Commonly known as:  MINIPRESS Take 5 mg by mouth at bedtime.   PROCRIT 10067341IT/ML injection Generic drug:  epoetin alfa Inject 10,000 Units into the skin every 30 (thirty) days.   vitamin  E 400 UNIT capsule Take 400 Units by mouth daily.       Review of Systems:  Review of Systems  Constitutional: Negative for chills, fever and malaise/fatigue.  HENT: Negative for congestion.   Eyes: Negative for blurred vision.  Respiratory: Negative for  cough and shortness of breath.   Cardiovascular: Negative for chest pain, palpitations and leg swelling.  Gastrointestinal: Negative for abdominal pain, blood in stool, constipation and melena.  Genitourinary: Negative for dysuria and urgency.  Musculoskeletal: Positive for joint pain.  Skin: Negative for itching and rash.  Neurological: Positive for dizziness. Negative for weakness.       Vertigo  Psychiatric/Behavioral: Negative for depression, memory loss and suicidal ideas. The patient is not nervous/anxious.        Bipolar well controlled    Health Maintenance  Topic Date Due  . Hepatitis C Screening  Dec 05, 1945  . INFLUENZA VACCINE  09/24/2016  . TETANUS/TDAP  05/25/2021  . COLONOSCOPY  06/20/2025  . PNA vac Low Risk Adult  Completed    Physical Exam: Vitals:   07/03/16 1441  BP: 122/70  Pulse: 67  Temp: 98.6 F (37 C)  TempSrc: Oral  SpO2: 98%  Weight: 178 lb (80.7 kg)   Body mass index is 21.67 kg/m. Physical Exam  Constitutional: He is oriented to person, place, and time. He appears well-nourished.  Tall thin male  Cardiovascular: Normal rate, regular rhythm, normal heart sounds and intact distal pulses.   Pulmonary/Chest: Effort normal and breath sounds normal. No respiratory distress.  Abdominal: Bowel sounds are normal. He exhibits no distension.  Musculoskeletal: Normal range of motion.  Neurological: He is alert and oriented to person, place, and time.  Skin: Skin is warm and dry. Capillary refill takes less than 2 seconds.  Psychiatric: He has a normal mood and affect.    Labs reviewed: Basic Metabolic Panel:  Recent Labs  08/10/15 1322  NA 143  K 3.7  CL 103  CO2 22  GLUCOSE 104*    BUN 15  CREATININE 1.54*  CALCIUM 8.4*   Liver Function Tests:  Recent Labs  08/10/15 1322  AST 15  ALT 7  ALKPHOS 54  BILITOT 0.3  PROT 6.2  ALBUMIN 3.5   No results for input(s): LIPASE, AMYLASE in the last 8760 hours. No results for input(s): AMMONIA in the last 8760 hours. CBC:  Recent Labs  08/10/15 1322  05/05/16 1244 06/02/16 1215 06/30/16 1235  WBC 7.7  --   --   --   --   NEUTROABS 4.0  --   --   --   --   HGB  --   < > 11.0* 11.3* 12.3*  HCT 35.9*  --   --   --   --   MCV 99*  --   --   --   --   PLT 167  --   --   --   --   < > = values in this interval not displayed. Lipid Panel:  Recent Labs  08/10/15 1322  CHOL 137  HDL 52  LDLCALC 64  TRIG 104  CHOLHDL 2.6   Lab Results  Component Value Date   HGBA1C 4.9 08/10/2015    Assessment/Plan 1. Crohn's disease of small intestine with rectal bleeding ([place]) -stable recently with remicade therapy  2. Bipolar 1 disorder (HCC) -stable back on his depakote and zyprexa  3. Chronic kidney disease (CKD), stage III (moderate) -stable rental function, cont to monitor  4. Anemia in stage 3 chronic kidney disease -follows with nephrology for mgt of this  5. Essential hypertension, benign -bp at goal with current therapy including norvasc, toprol xl  6. Acute idiopathic gout involving toe of right foot -was treated with colcrys from [place] and then  started on allopurinol to prevent  -was given dietary recommendations  7. Vertigo Cont prn meclizine for episodes, be careful and no sudden movements  Advised to check on new shingles vaccine at the [place]  Labs/tests ordered:  No orders of the defined types were placed in this encounter.  Next appt:  6 mos for CPE with me, already has AWV scheduled for that time with RN   Symphany Fleissner L. Blanka Rockholt, D.O. [facility] [address], [place] [address] Cell Phone (Mon-Fri 8am-5pm):  [phone] On Call:   [phone] & follow prompts after 5pm & weekends Office Phone:  [phone] Office Fax:  [phone]

## 2016-07-17 ENCOUNTER — Encounter (HOSPITAL_COMMUNITY): Payer: Medicare Other

## 2016-07-17 DIAGNOSIS — N183 Chronic kidney disease, stage 3 (moderate): Secondary | ICD-10-CM | POA: Diagnosis not present

## 2016-07-17 DIAGNOSIS — D631 Anemia in chronic kidney disease: Secondary | ICD-10-CM | POA: Diagnosis not present

## 2016-07-17 DIAGNOSIS — I129 Hypertensive chronic kidney disease with stage 1 through stage 4 chronic kidney disease, or unspecified chronic kidney disease: Secondary | ICD-10-CM | POA: Diagnosis not present

## 2016-07-17 DIAGNOSIS — N2581 Secondary hyperparathyroidism of renal origin: Secondary | ICD-10-CM | POA: Diagnosis not present

## 2016-07-18 DIAGNOSIS — N183 Chronic kidney disease, stage 3 (moderate): Secondary | ICD-10-CM | POA: Diagnosis not present

## 2016-07-18 DIAGNOSIS — N2581 Secondary hyperparathyroidism of renal origin: Secondary | ICD-10-CM | POA: Diagnosis not present

## 2016-07-22 ENCOUNTER — Encounter (HOSPITAL_COMMUNITY): Payer: Medicare Other

## 2016-07-28 ENCOUNTER — Encounter (HOSPITAL_COMMUNITY)
Admission: RE | Admit: 2016-07-28 | Discharge: 2016-07-28 | Disposition: A | Discharge status: Home / Self Care | Payer: Medicare Other | Source: Ambulatory Visit | Attending: Gastroenterology | Admitting: Gastroenterology

## 2016-07-28 ENCOUNTER — Encounter (HOSPITAL_COMMUNITY): Payer: Self-pay

## 2016-07-28 DIAGNOSIS — N183 Chronic kidney disease, stage 3 unspecified: Secondary | ICD-10-CM

## 2016-07-28 DIAGNOSIS — D6489 Other specified anemias: Secondary | ICD-10-CM | POA: Insufficient documentation

## 2016-07-28 LAB — IRON AND TIBC
Iron: 75 ug/dL (ref 45–182)
Saturation Ratios: 33 % (ref 17.9–39.5)
TIBC: 227 ug/dL — ABNORMAL LOW (ref 250–450)
UIBC: 152 ug/dL

## 2016-07-28 LAB — FERRITIN: Ferritin: 1047 ng/mL — ABNORMAL HIGH (ref 24–336)

## 2016-07-28 LAB — HEMOGLOBIN: HEMOGLOBIN: 12 g/dL — AB (ref 13.0–17.0)

## 2016-08-25 ENCOUNTER — Encounter (HOSPITAL_COMMUNITY)
Admission: RE | Admit: 2016-08-25 | Discharge: 2016-08-25 | Disposition: A | Discharge status: Home / Self Care | Payer: Medicare Other | Source: Ambulatory Visit | Attending: Gastroenterology | Admitting: Gastroenterology

## 2016-08-25 ENCOUNTER — Encounter (HOSPITAL_COMMUNITY): Payer: Self-pay

## 2016-08-25 DIAGNOSIS — N183 Chronic kidney disease, stage 3 unspecified: Secondary | ICD-10-CM

## 2016-08-25 DIAGNOSIS — D6489 Other specified anemias: Secondary | ICD-10-CM | POA: Diagnosis not present

## 2016-08-25 LAB — HEMOGLOBIN: Hemoglobin: 10.9 g/dL — ABNORMAL LOW (ref 13.0–17.0)

## 2016-08-25 MED ORDER — EPOETIN ALFA 10000 UNIT/ML IJ SOLN
10000.0000 [IU] | INTRAMUSCULAR | Status: DC
  Administered 2016-08-25: 10000 [IU] via SUBCUTANEOUS
  Filled 2016-08-25: qty 1

## 2016-09-22 ENCOUNTER — Encounter (HOSPITAL_COMMUNITY): Payer: Self-pay

## 2016-09-22 ENCOUNTER — Encounter (HOSPITAL_COMMUNITY)
Admission: RE | Admit: 2016-09-22 | Discharge: 2016-09-22 | Disposition: A | Discharge status: Home / Self Care | Payer: Medicare Other | Source: Ambulatory Visit | Attending: Internal Medicine | Admitting: Internal Medicine

## 2016-09-22 DIAGNOSIS — N183 Chronic kidney disease, stage 3 unspecified: Secondary | ICD-10-CM

## 2016-09-22 DIAGNOSIS — D6489 Other specified anemias: Secondary | ICD-10-CM | POA: Diagnosis not present

## 2016-09-22 LAB — HEMOGLOBIN: HEMOGLOBIN: 10.7 g/dL — AB (ref 13.0–17.0)

## 2016-09-22 MED ORDER — EPOETIN ALFA 10000 UNIT/ML IJ SOLN
10000.0000 [IU] | INTRAMUSCULAR | Status: DC
  Administered 2016-09-22: 10000 [IU] via SUBCUTANEOUS
  Filled 2016-09-22: qty 1

## 2016-10-06 ENCOUNTER — Telehealth: Payer: Self-pay

## 2016-10-06 ENCOUNTER — Encounter: Payer: Self-pay | Admitting: Internal Medicine

## 2016-10-06 ENCOUNTER — Ambulatory Visit (INDEPENDENT_AMBULATORY_CARE_PROVIDER_SITE_OTHER): Payer: Medicare Other | Admitting: Internal Medicine

## 2016-10-06 VITALS — BP 128/68 | HR 80 | Temp 98.5°F | Wt 174.0 lb

## 2016-10-06 DIAGNOSIS — R079 Chest pain, unspecified: Secondary | ICD-10-CM | POA: Diagnosis not present

## 2016-10-06 DIAGNOSIS — K50011 Crohn's disease of small intestine with rectal bleeding: Secondary | ICD-10-CM

## 2016-10-06 DIAGNOSIS — K219 Gastro-esophageal reflux disease without esophagitis: Secondary | ICD-10-CM

## 2016-10-06 NOTE — Progress Notes (Signed)
Location:  [facility] [place] clinic Provider:  Daquon Greenleaf L. Mariea Clonts, D.O., C.M.D.  Code Status: full code Goals of Care:  Advanced Directives 10/06/2016  Does Patient Have a Medical Advance Directive? No  Type of Advance Directive -  Does patient want to make changes to medical advance directive? -  Copy of [place] in Chart? -  Would patient like information on creating a medical advance directive? No - Patient declined  Pre-existing out of facility DNR order (yellow form or pink MOST form) -   Chief Complaint  Patient presents with  . Acute Visit    chest pain x1 year    HPI: Patient is a 71 y.o. male seen today for an acute visit for "chest pain for the past year".  He has a h/o Crohn's disease,  Pulmonary fibrosis, CKDIII, bipolar disorder, narcolepsy, BPH, HTN, gout, and anemia of chronic disease.    He had his cscope and now his small bowel is inflamed.  He's on a budesonide taper.  Says it gives him plenty of energy.  Feels like an elephant on his chest.  No shortness of breath or pain down his arm.  Does burp a lot.  Subsides after a while.  Will take tylenol w/o effect.  He is on prilosec--takes on empty stomach first thing b/c he does not eat breakfast.  Sometimes pain will last him 30 mins before goes away.  Has never taken tums or zantac.  He started the steroid about 1-2 wks ago now.  Notices the pain more when he goes to bed.  He does sleep on two pillows.  Gets up and eats a snack in the middle of the night.  Goes to bed at 9-10pm after eating around 5pm.  Says sometimes it's hard to burp.    Mentions swelling in his feet.  Happens by end of day.  Is on the move a lot.    Past Medical History:  Diagnosis Date  . Abdominal pain, right lower quadrant   . Abdominal pain, unspecified site   . Acute cholecystitis   . Anemia   . Anemia, unspecified   . Anxiety   . Benign paroxysmal positional vertigo   . Bipolar I disorder, most recent episode (or current) unspecified     . Celiac disease   . Cellulitis and abscess of unspecified site   . Chronic kidney disease, stage III (moderate)   . Cough   . Crohn's   . Depression   . Diarrhea   . Dizziness and giddiness   . Dizziness and giddiness   . Dizziness and giddiness   . Dysuria   . Edema   . Encounter for long-term (current) use of other medications   . Essential and other specified forms of tremor   . Essential hypertension, benign   . Gout 2018  . Hypertensive renal disease, benign   . Hypertrophy of prostate without urinary obstruction and other lower urinary tract symptoms (LUTS)   . Hypopotassemia   . Impacted cerumen   . Impotence of organic origin   . Insomnia with sleep apnea, unspecified   . Insomnia with sleep apnea, unspecified   . Iron deficiency anemia, unspecified   . Loss of weight   . Malnutrition of mild degree ([place])   . Nausea with vomiting   . Neuralgia, neuritis, and radiculitis, unspecified   . Other abnormal blood chemistry   . Other B-complex deficiencies   . Other B-complex deficiencies   . Other extrapyramidal  disease and abnormal movement disorder   . Other extrapyramidal disease and abnormal movement disorder   . Other malaise and fatigue   . Other specified disease of white blood cells   . Other specified disease of white blood cells   . Pain in joint, lower leg   . Pain in limb   . Postinflammatory pulmonary fibrosis ([place])   . Regional enteritis of small intestine ([place])   . Regional enteritis of small intestine with large intestine ([place])   . Renal insufficiency   . Tobacco use disorder   . Unspecified disorder of skin and subcutaneous tissue   . Vertigo 2018  . Viral warts, unspecified   . Viremia, unspecified     Past Surgical History:  Procedure Laterality Date  . CHOLECYSTECTOMY  07-12-2010  . SMALL INTESTINE SURGERY     x 2    Allergies  Allergen Reactions  . Azathioprine     REACTION: affected WBC  . Ciprofloxacin     Pt denies any reaction   . Levaquin [Levofloxacin In D5w]     Pt denies any reaction  . Plendil [Felodipine]     Pt denies any reaction    Allergies as of 10/06/2016      Reactions   Azathioprine    REACTION: affected WBC   Ciprofloxacin    Pt denies any reaction   Levaquin [levofloxacin In D5w]    Pt denies any reaction   Plendil [felodipine]    Pt denies any reaction      Medication List       Accurate as of 10/06/16  3:39 PM. Always use your most recent med list.          allopurinol 100 MG tablet Commonly known as:  ZYLOPRIM Take 2 tablets (200 mg total) by mouth daily.   amLODipine 2.5 MG tablet Commonly known as:  NORVASC Take 2.5 mg by mouth daily.   budesonide 3 MG 24 hr capsule Commonly known as:  ENTOCORT EC Take 9 mg by mouth daily. For 28 days, then 52m daily for 14 days, then 358mdaily for 14 days, then stop   Calcium Carbonate-Vit D-Min 600-400 MG-UNIT Tabs Take 1 tablet by mouth 2 (two) times daily.   cholecalciferol 1000 units tablet Commonly known as:  VITAMIN D Take 1,000 Units by mouth daily. 2 tablets once a day   divalproex 500 MG DR tablet Commonly known as:  DEPAKOTE Take 1,500 mg by mouth daily.   ferrous sulfate 325 (65 FE) MG tablet Take 325 mg by mouth 2 (two) times daily.   inFLIXimab 100 MG injection Commonly known as:  REMICADE Infuse Remicade IV schedule 1 13m22mg every 8 weeks Premedicate with Tylenol 500-650m7m mouth and Benadryl 25-50mg47mmouth prior to infusion. Last PPD was on 12/2009.   LOPERAMIDE A-D PO Take 1 capsule by mouth as needed (loose stools).   magnesium 30 MG tablet Take 60 mg by mouth 2 (two) times daily.   meclizine 25 MG tablet Commonly known as:  ANTIVERT Take 25 mg by mouth 3 (three) times daily as needed.   metoprolol succinate 50 MG 24 hr tablet Commonly known as:  TOPROL-XL Take 50 mg by mouth daily. Take with or immediately following a meal.   multivitamin tablet Take 1 tablet by mouth daily.   OLANZapine 7.5  MG tablet Commonly known as:  ZYPREXA Take 1 tablet (7.5 mg total) by mouth at bedtime.   omeprazole 20 MG capsule Commonly known as:  PRILOSEC Take 20 mg by mouth daily.   oxybutynin 15 MG 24 hr tablet Commonly known as:  DITROPAN XL Take 15 mg by mouth daily.   prazosin 5 MG capsule Commonly known as:  MINIPRESS Take 5 mg by mouth at bedtime.   PROCRIT 91478 UNIT/ML injection Generic drug:  epoetin alfa Inject 10,000 Units into the skin every 30 (thirty) days.   vitamin E 400 UNIT capsule Take 400 Units by mouth daily.       Review of Systems:  Review of Systems  Constitutional: Negative for chills, fever and malaise/fatigue.  Eyes: Negative for blurred vision.  Respiratory: Negative for cough and shortness of breath.   Cardiovascular: Positive for chest pain. Negative for palpitations.  Gastrointestinal: Negative for abdominal pain, blood in stool, constipation, diarrhea, melena, nausea and vomiting.       Burping; crohn's disease  Genitourinary: Negative for dysuria and urgency.  Musculoskeletal: Negative for falls and myalgias.  Skin: Negative for itching and rash.  Neurological: Negative for dizziness, loss of consciousness and weakness.  Endo/Heme/Allergies: Does not bruise/bleed easily.  Psychiatric/Behavioral: Negative for memory loss.       Bipolar    Health Maintenance  Topic Date Due  . Hepatitis C Screening  08-26-1945  . INFLUENZA VACCINE  09/24/2016  . TETANUS/TDAP  05/25/2021  . COLONOSCOPY  06/20/2025  . PNA vac Low Risk Adult  Completed    Physical Exam: Vitals:   10/06/16 1529  BP: 128/68  Pulse: 80  Temp: 98.5 F (36.9 C)  TempSrc: Oral  SpO2: 95%  Weight: 174 lb (78.9 kg)   Body mass index is 21.18 kg/m. Physical Exam  Constitutional: He is oriented to person, place, and time.  Tall thin AA male  Cardiovascular: Normal rate, regular rhythm, normal heart sounds and intact distal pulses.   Pulmonary/Chest: Effort normal and  breath sounds normal. No respiratory distress.  Abdominal: Soft. Bowel sounds are normal. He exhibits no distension. There is no tenderness.  Musculoskeletal: Normal range of motion. He exhibits tenderness.  No chest wall tenderness  Neurological: He is alert and oriented to person, place, and time.  Skin: Skin is warm and dry.  Psychiatric: He has a normal mood and affect.    Labs reviewed: Basic Metabolic Panel: No results for input(s): NA, K, CL, CO2, GLUCOSE, BUN, CREATININE, CALCIUM, MG, PHOS, TSH in the last 8760 hours. Liver Function Tests: No results for input(s): AST, ALT, ALKPHOS, BILITOT, PROT, ALBUMIN in the last 8760 hours. No results for input(s): LIPASE, AMYLASE in the last 8760 hours. No results for input(s): AMMONIA in the last 8760 hours. CBC:  Recent Labs  07/28/16 1105 08/25/16 1351 09/22/16 1209  HGB 12.0* 10.9* 10.7*   Lipid Panel: No results for input(s): CHOL, HDL, LDLCALC, TRIG, CHOLHDL, LDLDIRECT in the last 8760 hours. Lab Results  Component Value Date   HGBA1C 4.9 08/10/2015    Assessment/Plan 1. Chest pain, unspecified type -seems to be GERD-related due to eating habits before bed and taking prilosec and then not eating in the am -counseled not to eat 2-3 hrs before bed, given list of foods to avoid and also advised to take his prilosec before his evening meal rather than in the am since his symptoms are at hs -no findings to suggest cardiac etiology and nothing reproducible to suggest musculoskeletal -also may be worse past few weeks with steroid use for her enteritis  2. Gastroesophageal reflux disease, esophagitis presence not specified -ongoing, seems to be cause of  chest pain -see #1  3.  Crohn's regional enteritis -was actively flaring on cscope from last month--[facility]pt provided a copy of his cscope report which was otherwise negative for polyps or malignancy--sent to scan  Labs/tests  ordered:   No new Next appt:  01/05/2017   Brandilynn Taormina L. Rechy Bost, D.O. [facility] [address],  [address] Cell Phone (Mon-Fri 8am-5pm):  [phone] On Call:  [phone] & follow prompts after 5pm & weekends Office Phone:  [phone] Office Fax:  [phone]

## 2016-10-06 NOTE — Patient Instructions (Signed)
Avoid eating or drinking for 2-3 hours before bed. Take prilosec 20 minutes before your evening meal.    Food Choices for Gastroesophageal Reflux Disease, Adult When you have gastroesophageal reflux disease (GERD), the foods you eat and your eating habits are very important. Choosing the right foods can help ease your discomfort. What guidelines do I need to follow?  Choose fruits, vegetables, whole grains, and low-fat dairy products.  Choose low-fat meat, fish, and poultry.  Limit fats such as oils, salad dressings, butter, nuts, and avocado.  Keep a food diary. This helps you identify foods that cause symptoms.  Avoid foods that cause symptoms. These may be different for everyone.  Eat small meals often instead of 3 large meals a day.  Eat your meals slowly, in a place where you are relaxed.  Limit fried foods.  Cook foods using methods other than frying.  Avoid drinking alcohol.  Avoid drinking large amounts of liquids with your meals.  Avoid bending over or lying down until 2-3 hours after eating. What foods are not recommended? These are some foods and drinks that may make your symptoms worse: Vegetables Tomatoes. Tomato juice. Tomato and spaghetti sauce. Chili peppers. Onion and garlic. Horseradish. Fruits Oranges, grapefruit, and lemon (fruit and juice). Meats High-fat meats, fish, and poultry. This includes hot dogs, ribs, ham, sausage, salami, and bacon. Dairy Whole milk and chocolate milk. Sour cream. Cream. Butter. Ice cream. Cream cheese. Drinks Coffee and tea. Bubbly (carbonated) drinks or energy drinks. Condiments Hot sauce. Barbecue sauce. Sweets/Desserts Chocolate and cocoa. Donuts. Peppermint and spearmint. Fats and Oils High-fat foods. This includes [place] fries and potato chips. Other Vinegar. Strong spices. This includes black pepper, white pepper, red pepper, cayenne, curry powder, cloves, ginger, and chili powder. The items listed above may not  be a complete list of foods and drinks to avoid. Contact your dietitian for more information. This information is not intended to replace advice given to you by your health care provider. Make sure you discuss any questions you have with your health care provider. Document Released: 08/12/2011 Document Revised: 07/19/2015 Document Reviewed: 12/15/2012 Elsevier Interactive Patient Education  2017 [employer].

## 2016-10-06 NOTE — Telephone Encounter (Signed)
Patient called c/o of chest pain   1. Where is the pain located? Entire upper chest area   2. Does the pain radiate? (left arm or jaw) No  3. Any associated symptoms like shortness of breath,sweating,indigestion, or anxiety? Belching   4. If patient with history of myocardial infarction/coronary artery disease does the pain feel like your previous episode/heart attack  N/A  5. How long have you had the pain? X 1 year off/on   Appointment scheduled for today at 3:30 pm

## 2016-10-20 ENCOUNTER — Encounter (HOSPITAL_COMMUNITY)
Admission: RE | Admit: 2016-10-20 | Discharge: 2016-10-20 | Disposition: A | Discharge status: Home / Self Care | Payer: Medicare Other | Source: Ambulatory Visit | Attending: Gastroenterology | Admitting: Gastroenterology

## 2016-10-20 ENCOUNTER — Encounter (HOSPITAL_COMMUNITY): Payer: Self-pay

## 2016-10-20 DIAGNOSIS — I739 Peripheral vascular disease, unspecified: Secondary | ICD-10-CM | POA: Diagnosis not present

## 2016-10-20 DIAGNOSIS — M2041 Other hammer toe(s) (acquired), right foot: Secondary | ICD-10-CM | POA: Diagnosis not present

## 2016-10-20 DIAGNOSIS — D6489 Other specified anemias: Secondary | ICD-10-CM | POA: Diagnosis not present

## 2016-10-20 DIAGNOSIS — N183 Chronic kidney disease, stage 3 unspecified: Secondary | ICD-10-CM

## 2016-10-20 DIAGNOSIS — B351 Tinea unguium: Secondary | ICD-10-CM | POA: Diagnosis not present

## 2016-10-20 LAB — HEMOGLOBIN: HEMOGLOBIN: 10.6 g/dL — AB (ref 13.0–17.0)

## 2016-10-20 MED ORDER — EPOETIN ALFA 10000 UNIT/ML IJ SOLN
10000.0000 [IU] | INTRAMUSCULAR | Status: DC
  Administered 2016-10-20: 10000 [IU] via SUBCUTANEOUS
  Filled 2016-10-20: qty 1

## 2016-11-12 DIAGNOSIS — N2581 Secondary hyperparathyroidism of renal origin: Secondary | ICD-10-CM | POA: Diagnosis not present

## 2016-11-12 DIAGNOSIS — N183 Chronic kidney disease, stage 3 (moderate): Secondary | ICD-10-CM | POA: Diagnosis not present

## 2016-11-12 DIAGNOSIS — D631 Anemia in chronic kidney disease: Secondary | ICD-10-CM | POA: Diagnosis not present

## 2016-11-12 DIAGNOSIS — I129 Hypertensive chronic kidney disease with stage 1 through stage 4 chronic kidney disease, or unspecified chronic kidney disease: Secondary | ICD-10-CM | POA: Diagnosis not present

## 2016-11-18 ENCOUNTER — Encounter (HOSPITAL_COMMUNITY)
Admission: RE | Admit: 2016-11-18 | Discharge: 2016-11-18 | Disposition: A | Discharge status: Home / Self Care | Payer: Medicare Other | Source: Ambulatory Visit | Attending: Gastroenterology | Admitting: Gastroenterology

## 2016-11-18 DIAGNOSIS — N183 Chronic kidney disease, stage 3 (moderate): Secondary | ICD-10-CM | POA: Insufficient documentation

## 2016-11-18 DIAGNOSIS — D6489 Other specified anemias: Secondary | ICD-10-CM | POA: Insufficient documentation

## 2016-11-24 ENCOUNTER — Encounter (HOSPITAL_COMMUNITY): Payer: Self-pay

## 2016-11-24 ENCOUNTER — Encounter (HOSPITAL_COMMUNITY)
Admission: RE | Admit: 2016-11-24 | Discharge: 2016-11-24 | Disposition: A | Discharge status: Home / Self Care | Payer: Medicare Other | Source: Ambulatory Visit | Attending: Gastroenterology | Admitting: Gastroenterology

## 2016-11-24 DIAGNOSIS — N183 Chronic kidney disease, stage 3 unspecified: Secondary | ICD-10-CM

## 2016-11-24 DIAGNOSIS — D6489 Other specified anemias: Secondary | ICD-10-CM | POA: Diagnosis not present

## 2016-11-24 LAB — IRON AND TIBC
Iron: 62 ug/dL (ref 45–182)
Saturation Ratios: 31 % (ref 17.9–39.5)
TIBC: 202 ug/dL — ABNORMAL LOW (ref 250–450)
UIBC: 140 ug/dL

## 2016-11-24 LAB — HEMOGLOBIN: Hemoglobin: 10.5 g/dL — ABNORMAL LOW (ref 13.0–17.0)

## 2016-11-24 LAB — FERRITIN: Ferritin: 790 ng/mL — ABNORMAL HIGH (ref 24–336)

## 2016-11-24 MED ORDER — EPOETIN ALFA 10000 UNIT/ML IJ SOLN
10000.0000 [IU] | INTRAMUSCULAR | Status: DC
  Administered 2016-11-24: 10000 [IU] via SUBCUTANEOUS
  Filled 2016-11-24: qty 1

## 2016-11-24 MED ORDER — EPOETIN ALFA 10000 UNIT/ML IJ SOLN: 10000.0000 [IU] | INTRAMUSCULAR | Status: DC

## 2016-11-24 NOTE — Discharge Instructions (Signed)
Epoetin Alfa injection / Procrit What is this medicine? EPOETIN ALFA (e POE e tin AL fa) helps your body make more red blood cells. This medicine is used to treat anemia caused by chronic kidney failure, cancer chemotherapy, or HIV-therapy. It may also be used before surgery if you have anemia. This medicine may be used for other purposes; ask your health care provider or pharmacist if you have questions. COMMON BRAND NAME(S): Epogen, Procrit What should I tell my health care provider before I take this medicine? They need to know if you have any of these conditions: -blood clotting disorders -cancer patient not on chemotherapy -cystic fibrosis -heart disease, such as angina or heart failure -hemoglobin level of 12 g/dL or greater -high blood pressure -low levels of folate, iron, or vitamin B12 -seizures -an unusual or allergic reaction to erythropoietin, albumin, benzyl alcohol, hamster proteins, other medicines, foods, dyes, or preservatives -pregnant or trying to get pregnant -breast-feeding How should I use this medicine? This medicine is for injection into a vein or under the skin. It is usually given by a health care professional in a hospital or clinic setting. If you get this medicine at home, you will be taught how to prepare and give this medicine. Use exactly as directed. Take your medicine at regular intervals. Do not take your medicine more often than directed. It is important that you put your used needles and syringes in a special sharps container. Do not put them in a trash can. If you do not have a sharps container, call your pharmacist or healthcare provider to get one. A special MedGuide will be given to you by the pharmacist with each prescription and refill. Be sure to read this information carefully each time. Talk to your pediatrician regarding the use of this medicine in children. While this drug may be prescribed for selected conditions, precautions do  apply. Overdosage: If you think you have taken too much of this medicine contact a poison control center or emergency room at once. NOTE: This medicine is only for you. Do not share this medicine with others. What if I miss a dose? If you miss a dose, take it as soon as you can. If it is almost time for your next dose, take only that dose. Do not take double or extra doses. What may interact with this medicine? Do not take this medicine with any of the following medications: -darbepoetin alfa This list may not describe all possible interactions. Give your health care provider a list of all the medicines, herbs, non-prescription drugs, or dietary supplements you use. Also tell them if you smoke, drink alcohol, or use illegal drugs. Some items may interact with your medicine. What should I watch for while using this medicine? Your condition will be monitored carefully while you are receiving this medicine. You may need blood work done while you are taking this medicine. What side effects may I notice from receiving this medicine? Side effects that you should report to your doctor or health care professional as soon as possible: -allergic reactions like skin rash, itching or hives, swelling of the face, lips, or tongue -breathing problems -changes in vision -chest pain -confusion, trouble speaking or understanding -feeling faint or lightheaded, falls -high blood pressure -muscle aches or pains -pain, swelling, warmth in the leg -rapid weight gain -severe headaches -sudden numbness or weakness of the face, arm or leg -trouble walking, dizziness, loss of balance or coordination -seizures (convulsions) -swelling of the ankles, feet, hands -unusually weak  or tired Side effects that usually do not require medical attention (report to your doctor or health care professional if they continue or are bothersome): -diarrhea -fever, chills (flu-like symptoms) -headaches -nausea,  vomiting -redness, stinging, or swelling at site where injected This list may not describe all possible side effects. Call your doctor for medical advice about side effects. You may report side effects to FDA at 1-800-FDA-1088. Where should I keep my medicine? Keep out of the reach of children. Store in a refrigerator between 2 and 8 degrees C (36 and 46 degrees F). Do not freeze or shake. Throw away any unused portion if using a single-dose vial. Multi-dose vials can be kept in the refrigerator for up to 21 days after the initial dose. Throw away unused medicine. NOTE: This sheet is a summary. It may not cover all possible information. If you have questions about this medicine, talk to your doctor, pharmacist, or health care provider.  2018 Elsevier/Gold Standard (2015-10-01 19:42:31)

## 2016-12-16 ENCOUNTER — Encounter (HOSPITAL_COMMUNITY): Payer: Medicare Other

## 2016-12-29 ENCOUNTER — Encounter (HOSPITAL_COMMUNITY): Payer: Self-pay

## 2016-12-29 ENCOUNTER — Encounter (HOSPITAL_COMMUNITY)
Admission: RE | Admit: 2016-12-29 | Discharge: 2016-12-29 | Disposition: A | Discharge status: Home / Self Care | Payer: Medicare Other | Source: Ambulatory Visit | Attending: Gastroenterology | Admitting: Gastroenterology

## 2016-12-29 VITALS — BP 137/82 | HR 82 | Temp 97.8°F | Resp 16

## 2016-12-29 DIAGNOSIS — N183 Chronic kidney disease, stage 3 unspecified: Secondary | ICD-10-CM

## 2016-12-29 DIAGNOSIS — D6489 Other specified anemias: Secondary | ICD-10-CM | POA: Diagnosis not present

## 2016-12-29 LAB — FERRITIN: Ferritin: 793 ng/mL — ABNORMAL HIGH (ref 24–336)

## 2016-12-29 LAB — HEMOGLOBIN: Hemoglobin: 11.2 g/dL — ABNORMAL LOW (ref 13.0–17.0)

## 2016-12-29 LAB — IRON AND TIBC
Iron: 66 ug/dL (ref 45–182)
Saturation Ratios: 34 % (ref 17.9–39.5)
TIBC: 196 ug/dL — ABNORMAL LOW (ref 250–450)
UIBC: 130 ug/dL

## 2016-12-29 MED ORDER — EPOETIN ALFA 10000 UNIT/ML IJ SOLN
10000.0000 [IU] | INTRAMUSCULAR | Status: DC
  Administered 2016-12-29: 10000 [IU] via SUBCUTANEOUS
  Filled 2016-12-29: qty 1

## 2016-12-29 NOTE — Discharge Instructions (Signed)
Procrit Epoetin Alfa injection What is this medicine? EPOETIN ALFA (e POE e tin AL fa) helps your body make more red blood cells. This medicine is used to treat anemia caused by chronic kidney failure, cancer chemotherapy, or HIV-therapy. It may also be used before surgery if you have anemia. This medicine may be used for other purposes; ask your health care provider or pharmacist if you have questions. COMMON BRAND NAME(S): Epogen, Procrit What should I tell my health care provider before I take this medicine? They need to know if you have any of these conditions: -blood clotting disorders -cancer patient not on chemotherapy -cystic fibrosis -heart disease, such as angina or heart failure -hemoglobin level of 12 g/dL or greater -high blood pressure -low levels of folate, iron, or vitamin B12 -seizures -an unusual or allergic reaction to erythropoietin, albumin, benzyl alcohol, hamster proteins, other medicines, foods, dyes, or preservatives -pregnant or trying to get pregnant -breast-feeding How should I use this medicine? This medicine is for injection into a vein or under the skin. It is usually given by a health care professional in a hospital or clinic setting. If you get this medicine at home, you will be taught how to prepare and give this medicine. Use exactly as directed. Take your medicine at regular intervals. Do not take your medicine more often than directed. It is important that you put your used needles and syringes in a special sharps container. Do not put them in a trash can. If you do not have a sharps container, call your pharmacist or healthcare provider to get one. A special MedGuide will be given to you by the pharmacist with each prescription and refill. Be sure to read this information carefully each time. Talk to your pediatrician regarding the use of this medicine in children. While this drug may be prescribed for selected conditions, precautions do  apply. Overdosage: If you think you have taken too much of this medicine contact a poison control center or emergency room at once. NOTE: This medicine is only for you. Do not share this medicine with others. What if I miss a dose? If you miss a dose, take it as soon as you can. If it is almost time for your next dose, take only that dose. Do not take double or extra doses. What may interact with this medicine? Do not take this medicine with any of the following medications: -darbepoetin alfa This list may not describe all possible interactions. Give your health care provider a list of all the medicines, herbs, non-prescription drugs, or dietary supplements you use. Also tell them if you smoke, drink alcohol, or use illegal drugs. Some items may interact with your medicine. What should I watch for while using this medicine? Your condition will be monitored carefully while you are receiving this medicine. You may need blood work done while you are taking this medicine. What side effects may I notice from receiving this medicine? Side effects that you should report to your doctor or health care professional as soon as possible: -allergic reactions like skin rash, itching or hives, swelling of the face, lips, or tongue -breathing problems -changes in vision -chest pain -confusion, trouble speaking or understanding -feeling faint or lightheaded, falls -high blood pressure -muscle aches or pains -pain, swelling, warmth in the leg -rapid weight gain -severe headaches -sudden numbness or weakness of the face, arm or leg -trouble walking, dizziness, loss of balance or coordination -seizures (convulsions) -swelling of the ankles, feet, hands -unusually weak or  tired Side effects that usually do not require medical attention (report to your doctor or health care professional if they continue or are bothersome): -diarrhea -fever, chills (flu-like symptoms) -headaches -nausea,  vomiting -redness, stinging, or swelling at site where injected This list may not describe all possible side effects. Call your doctor for medical advice about side effects. You may report side effects to FDA at 1-800-FDA-1088. Where should I keep my medicine? Keep out of the reach of children. Store in a refrigerator between 2 and 8 degrees C (36 and 46 degrees F). Do not freeze or shake. Throw away any unused portion if using a single-dose vial. Multi-dose vials can be kept in the refrigerator for up to 21 days after the initial dose. Throw away unused medicine. NOTE: This sheet is a summary. It may not cover all possible information. If you have questions about this medicine, talk to your doctor, pharmacist, or health care provider.  2018 Elsevier/Gold Standard (2015-10-01 19:42:31)

## 2017-01-05 ENCOUNTER — Ambulatory Visit (INDEPENDENT_AMBULATORY_CARE_PROVIDER_SITE_OTHER): Payer: Medicare Other | Admitting: Internal Medicine

## 2017-01-05 ENCOUNTER — Ambulatory Visit (INDEPENDENT_AMBULATORY_CARE_PROVIDER_SITE_OTHER): Payer: Medicare Other

## 2017-01-05 ENCOUNTER — Encounter: Payer: Self-pay | Admitting: Internal Medicine

## 2017-01-05 VITALS — BP 165/70 | HR 81 | Temp 97.9°F | Ht 76.0 in | Wt 173.0 lb

## 2017-01-05 DIAGNOSIS — K50011 Crohn's disease of small intestine with rectal bleeding: Secondary | ICD-10-CM | POA: Diagnosis not present

## 2017-01-05 DIAGNOSIS — N401 Enlarged prostate with lower urinary tract symptoms: Secondary | ICD-10-CM

## 2017-01-05 DIAGNOSIS — N183 Chronic kidney disease, stage 3 unspecified: Secondary | ICD-10-CM

## 2017-01-05 DIAGNOSIS — I10 Essential (primary) hypertension: Secondary | ICD-10-CM

## 2017-01-05 DIAGNOSIS — Z0000 Encounter for general adult medical examination without abnormal findings: Secondary | ICD-10-CM

## 2017-01-05 DIAGNOSIS — F319 Bipolar disorder, unspecified: Secondary | ICD-10-CM

## 2017-01-05 DIAGNOSIS — J8410 Pulmonary fibrosis, unspecified: Secondary | ICD-10-CM | POA: Diagnosis not present

## 2017-01-05 DIAGNOSIS — R350 Frequency of micturition: Secondary | ICD-10-CM

## 2017-01-05 DIAGNOSIS — D631 Anemia in chronic kidney disease: Secondary | ICD-10-CM

## 2017-01-05 DIAGNOSIS — Z87891 Personal history of nicotine dependence: Secondary | ICD-10-CM | POA: Insufficient documentation

## 2017-01-05 MED ORDER — ZOSTER VAC RECOMB ADJUVANTED 50 MCG/0.5ML IM SUSR: 0.5000 mL | Freq: Once | INTRAMUSCULAR | 1 refills | Status: AC

## 2017-01-05 NOTE — Patient Instructions (Signed)
Brent [name] , Thank you for taking time to come for your Medicare Wellness Visit. I appreciate your ongoing commitment to your health goals. Please review the following plan we discussed and let me know if I can assist you in the future.   Screening recommendations/referrals: Colonoscopy/Cologuard up to date. Due 10/07/2019 Recommended yearly ophthalmology/optometry visit for glaucoma screening and checkup Recommended yearly dental visit for hygiene and checkup  Vaccinations: Influenza vaccine up to date. Due 2019 fall season Pneumococcal vaccine up to date. Tdap vaccine up to date. Due 05/25/2021 Shingles vaccine due, prescription sent to pharmacy  Advanced directives: Please bring [name] a copy of your living will and health care power of attorney to offie.  Conditions/risks identified: None  Next appointment: Dr. Mariea Clonts 01/05/17 @ 1:30pm    Tyson Dense, RN 01/13/2017 @ 1:45pm  Preventive Care 40 Years and Older, Male Preventive care refers to lifestyle choices and visits with your health care provider that can promote health and wellness. What does preventive care include?  A yearly physical exam. This is also called an annual well check.  Dental exams once or twice a year.  Routine eye exams. Ask your health care provider how often you should have your eyes checked.  Personal lifestyle choices, including:  Daily care of your teeth and gums.  Regular physical activity.  Eating a healthy diet.  Avoiding tobacco and drug use.  Limiting alcohol use.  Practicing safe sex.  Taking low doses of aspirin every day.  Taking vitamin and mineral supplements as recommended by your health care provider. What happens during an annual well check? The services and screenings done by your health care provider during your annual well check will depend on your age, overall health, lifestyle risk factors, and family history of disease. Counseling  Your health care provider may ask you  questions about your:  Alcohol use.  Tobacco use.  Drug use.  Emotional well-being.  Home and relationship well-being.  Sexual activity.  Eating habits.  History of falls.  Memory and ability to understand (cognition).  Work and work Statistician. Screening  You may have the following tests or measurements:  Height, weight, and BMI.  Blood pressure.  Lipid and cholesterol levels. These may be checked every 5 years, or more frequently if you are over 52 years old.  Skin check.  Lung cancer screening. You may have this screening every year starting at age 80 if you have a 30-pack-year history of smoking and currently smoke or have quit within the past 15 years.  Fecal occult blood test (FOBT) of the stool. You may have this test every year starting at age 86.  Flexible sigmoidoscopy or colonoscopy. You may have a sigmoidoscopy every 5 years or a colonoscopy every 10 years starting at age 71.  Prostate cancer screening. Recommendations will vary depending on your family history and other risks.  Hepatitis C blood test.  Hepatitis B blood test.  Sexually transmitted disease (STD) testing.  Diabetes screening. This is done by checking your blood sugar (glucose) after you have not eaten for a while (fasting). You may have this done every 1-3 years.  Abdominal aortic aneurysm (AAA) screening. You may need this if you are a current or former smoker.  Osteoporosis. You may be screened starting at age 88 if you are at high risk. Talk with your health care provider about your test results, treatment options, and if necessary, the need for more tests. Vaccines  Your health care provider may recommend certain  vaccines, such as:  Influenza vaccine. This is recommended every year.  Tetanus, diphtheria, and acellular pertussis (Tdap, Td) vaccine. You may need a Td booster every 10 years.  Zoster vaccine. You may need this after age 69.  Pneumococcal 13-valent conjugate  (PCV13) vaccine. One dose is recommended after age 26.  Pneumococcal polysaccharide (PPSV23) vaccine. One dose is recommended after age 49. Talk to your health care provider about which screenings and vaccines you need and how often you need them. This information is not intended to replace advice given to you by your health care provider. Make sure you discuss any questions you have with your health care provider. Document Released: 03/09/2015 Document Revised: 10/31/2015 Document Reviewed: 12/12/2014 Elsevier Interactive Patient Education  2017 [place] Prevention in the Home Falls can cause injuries. They can happen to people of all ages. There are many things you can do to make your home safe and to help prevent falls. What can I do on the outside of my home?  Regularly fix the edges of walkways and driveways and fix any cracks.  Remove anything that might make you trip as you walk through a door, such as a raised step or threshold.  Trim any bushes or trees on the path to your home.  Use bright outdoor lighting.  Clear any walking paths of anything that might make someone trip, such as rocks or tools.  Regularly check to see if handrails are loose or broken. Make sure that both sides of any steps have handrails.  Any raised decks and porches should have guardrails on the edges.  Have any leaves, snow, or ice cleared regularly.  Use sand or salt on walking paths during winter.  Clean up any spills in your garage right away. This includes oil or grease spills. What can I do in the bathroom?  Use night lights.  Install grab bars by the toilet and in the tub and shower. Do not use towel bars as grab bars.  Use non-skid mats or decals in the tub or shower.  If you need to sit down in the shower, use a plastic, non-slip stool.  Keep the floor dry. Clean up any water that spills on the floor as soon as it happens.  Remove soap buildup in the tub or shower  regularly.  Attach bath mats securely with double-sided non-slip rug tape.  Do not have throw rugs and other things on the floor that can make you trip. What can I do in the bedroom?  Use night lights.  Make sure that you have a light by your bed that is easy to reach.  Do not use any sheets or blankets that are too big for your bed. They should not hang down onto the floor.  Have a firm chair that has side arms. You can use this for support while you get dressed.  Do not have throw rugs and other things on the floor that can make you trip. What can I do in the kitchen?  Clean up any spills right away.  Avoid walking on wet floors.  Keep items that you use a lot in easy-to-reach places.  If you need to reach something above you, use a strong step stool that has a grab bar.  Keep electrical cords out of the way.  Do not use floor polish or wax that makes floors slippery. If you must use wax, use non-skid floor wax.  Do not have throw rugs and other things  on the floor that can make you trip. What can I do with my stairs?  Do not leave any items on the stairs.  Make sure that there are handrails on both sides of the stairs and use them. Fix handrails that are broken or loose. Make sure that handrails are as long as the stairways.  Check any carpeting to make sure that it is firmly attached to the stairs. Fix any carpet that is loose or worn.  Avoid having throw rugs at the top or bottom of the stairs. If you do have throw rugs, attach them to the floor with carpet tape.  Make sure that you have a light switch at the top of the stairs and the bottom of the stairs. If you do not have them, ask someone to add them for you. What else can I do to help prevent falls?  Wear shoes that:  Do not have high heels.  Have rubber bottoms.  Are comfortable and fit you well.  Are closed at the toe. Do not wear sandals.  If you use a stepladder:  Make sure that it is fully  opened. Do not climb a closed stepladder.  Make sure that both sides of the stepladder are locked into place.  Ask someone to hold it for you, if possible.  Clearly mark and make sure that you can see:  Any grab bars or handrails.  First and last steps.  Where the edge of each step is.  Use tools that help you move around (mobility aids) if they are needed. These include:  Canes.  Walkers.  Scooters.  Crutches.  Turn on the lights when you go into a dark area. Replace any light bulbs as soon as they burn out.  Set up your furniture so you have a clear path. Avoid moving your furniture around.  If any of your floors are uneven, fix them.  If there are any pets around you, be aware of where they are.  Review your medicines with your doctor. Some medicines can make you feel dizzy. This can increase your chance of falling. Ask your doctor what other things that you can do to help prevent falls. This information is not intended to replace advice given to you by your health care provider. Make sure you discuss any questions you have with your health care provider. Document Released: 12/07/2008 Document Revised: 07/19/2015 Document Reviewed: 03/17/2014 Elsevier Interactive Patient Education  2017 [employer].

## 2017-01-05 NOTE — Progress Notes (Signed)
Provider:  Rexene Edison. Mariea Clonts, D.O., C.M.D. Location:   [facility]  Place of Service:   clinic  Previous PCP: Gayland Curry, DO Patient Care Team: Gayland Curry, DO as PCP - General (Geriatric Medicine) Elmarie Shiley, MD (Nephrology) Milus Banister, MD (Gastroenterology)  Extended Emergency Contact Information Primary Emergency Contact: [facility] Bledsoe Address: [address]          [place], [place] [address] [name] of [place] Phone: [phone] Relation: Mother  Code Status: DNR Goals of Care: Advanced Directive information Advanced Directives 01/05/2017  Does Patient Have a Medical Advance Directive? Yes  Type of Paramedic of [place];Living will  Does patient want to make changes to medical advance directive? No - Patient declined  Copy of [place] in Chart? No - copy requested  Would patient like information on creating a medical advance directive? -  Pre-existing out of facility DNR order (yellow form or pink MOST form) -   Chief Complaint  Patient presents with  . Annual Exam    CPE    HPI: Patient is a 71 y.o. male seen today for an annual physical exam.  Doing fine.  Last day of steroids for an infection in his small intestine.  Was on a taper for 6 wks.   Got something different than prednisone.  He was having acute diarrhea, no fever.  No abdominal pain.  Says the diarrhea came out like a cscope prep.   Having solid stools almost to where he had to strain once.    BP well controlled.  Had not had his meds when he saw Clarise Cruz earlier.    Shingrix was sent to the pharmacy.   Crohn's:  Has urgency at times and can't find parking--has had accidents.  Asks about a handicapped sticker for this situation.    BPH:  Doing ok.  On oxybutynin for the urgency.  Myrbetriq is too expensive on his insurance.  Has to get up 4-5 times in the morning.  Had to stop on 70 and run into the field.    CKD3:  Continues on txs for anemia  related to this.  Creatinine has not been checked lately.  hgb stays at 11.5.    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  Not smoking.    Spirits very good.    Uses minipress for nightmares not narcolepsy.   Has 82 pyh of smoking.  He quit smoking but smoked 1ppd for 50 years.  He's asymptomatic.    Past Medical History:  Diagnosis Date  . Abdominal pain, right lower quadrant   . Abdominal pain, unspecified site   . Acute cholecystitis   . Anemia   . Anemia, unspecified   . Anxiety   . Benign paroxysmal positional vertigo   . Bipolar I disorder, most recent episode (or current) unspecified   . Celiac disease   . Cellulitis and abscess of unspecified site   . Chronic kidney disease, stage III (moderate) (HCC)   . Cough   . Crohn's   . Depression   . Diarrhea   . Dizziness and giddiness   . Dizziness and giddiness   . Dizziness and giddiness   . Dysuria   . Edema   . Encounter for long-term (current) use of other medications   . Essential and other specified forms of tremor   . Essential hypertension, benign   . Gout 2018  . Hypertensive renal disease, benign   . Hypertrophy of prostate  without urinary obstruction and other lower urinary tract symptoms (LUTS)   . Hypopotassemia   . Impacted cerumen   . Impotence of organic origin   . Insomnia with sleep apnea, unspecified   . Insomnia with sleep apnea, unspecified   . Iron deficiency anemia, unspecified   . Loss of weight   . Malnutrition of mild degree ([place])   . Nausea with vomiting   . Neuralgia, neuritis, and radiculitis, unspecified   . Other abnormal blood chemistry   . Other B-complex deficiencies   . Other B-complex deficiencies   . Other extrapyramidal disease and abnormal movement disorder   . Other extrapyramidal disease and abnormal movement disorder   . Other malaise and fatigue   . Other specified disease of white blood cells   . Other specified disease of white blood cells   .  Pain in joint, lower leg   . Pain in limb   . Postinflammatory pulmonary fibrosis ([place])   . Regional enteritis of small intestine ([place])   . Regional enteritis of small intestine with large intestine ([place])   . Renal insufficiency   . Tobacco use disorder   . Unspecified disorder of skin and subcutaneous tissue   . Vertigo 2018  . Viral warts, unspecified   . Viremia, unspecified    Past Surgical History:  Procedure Laterality Date  . CHOLECYSTECTOMY  07-12-2010  . SMALL INTESTINE SURGERY     x 2    reports that he quit smoking about 2 years ago. His smoking use included cigarettes. He started smoking about 60 years ago. He has a 49.00 pack-year smoking history. he has never used smokeless tobacco. He reports that he does not drink alcohol or use drugs.  Functional Status Survey:    Family History  Problem Relation Age of Onset  . Diabetes Mother        maternal grandmother  . Uterine cancer Mother   . Emphysema Father   . Pneumonia Maternal Grandmother   . Colon cancer Neg Hx     Health Maintenance  Topic Date Due  . Hepatitis C Screening  11-May-1945  . TETANUS/TDAP  05/25/2021  . COLONOSCOPY  06/20/2025  . INFLUENZA VACCINE  Completed  . PNA vac Low Risk Adult  Completed    Allergies  Allergen Reactions  . Azathioprine     REACTION: affected WBC  . Ciprofloxacin     Pt denies any reaction  . Levaquin [Levofloxacin In D5w]     Pt denies any reaction  . Plendil [Felodipine]     Pt denies any reaction    Outpatient Encounter Medications as of 01/05/2017  Medication Sig  . allopurinol (ZYLOPRIM) 100 MG tablet Take 2 tablets (200 mg total) by mouth daily.  Marland Kitchen amLODipine (NORVASC) 2.5 MG tablet Take 2.5 mg by mouth daily.  . Calcium Carbonate-Vit D-Min 600-400 MG-UNIT TABS Take 1 tablet by mouth 2 (two) times daily.    . cholecalciferol (VITAMIN D) 1000 units tablet Take 1,000 Units by mouth daily. 2 tablets once a day  . divalproex (DEPAKOTE) 500 MG EC tablet Take  1,500 mg by mouth daily.   Marland Kitchen epoetin alfa (PROCRIT) 76283 UNIT/ML injection Inject 10,000 Units into the skin every 30 (thirty) days.   . ferrous sulfate 325 (65 FE) MG tablet Take 325 mg by mouth 2 (two) times daily.    Marland Kitchen inFLIXimab (REMICADE) 100 MG injection Infuse Remicade IV schedule 1 51m/kg every 8 weeks Premedicate with Tylenol 500-6555mby mouth and  Benadryl 25-43m by mouth prior to infusion. Last PPD was on 12/2009.   . Loperamide HCl (LOPERAMIDE A-D PO) Take 1 capsule by mouth as needed (loose stools).   . magnesium 30 MG tablet Take 60 mg by mouth 2 (two) times daily.   . meclizine (ANTIVERT) 25 MG tablet Take 25 mg by mouth 3 (three) times daily as needed.   . metoprolol succinate (TOPROL-XL) 50 MG 24 hr tablet Take 50 mg by mouth daily. Take with or immediately following a meal.  . Multiple Vitamin (MULTIVITAMIN) tablet Take 1 tablet by mouth daily.    .Marland KitchenOLANZapine (ZYPREXA) 7.5 MG tablet Take 1 tablet (7.5 mg total) by mouth at bedtime.  .Marland Kitchenomeprazole (PRILOSEC) 20 MG capsule Take 20 mg by mouth daily before supper.    .Marland Kitchenoxybutynin (DITROPAN XL) 15 MG 24 hr tablet Take 15 mg by mouth daily.    . prazosin (MINIPRESS) 5 MG capsule Take 5 mg by mouth at bedtime.  . vitamin E 400 UNIT capsule Take 400 Units by mouth daily.    .Marland KitchenZoster Vaccine Adjuvanted ([facility] injection Inject 0.5 mLs once for 1 dose into the muscle.   No facility-administered encounter medications on file as of 01/05/2017.     Review of Systems  Constitutional: Negative for chills and fever.  HENT: Positive for hearing loss. Negative for congestion.        Cerumen impaction--has his own flushing kit he will use  Eyes:       New glasses, just saw ophtho  Respiratory: Negative for shortness of breath.   Cardiovascular: Negative for chest pain and palpitations.  Gastrointestinal: Positive for diarrhea. Negative for abdominal pain, blood in stool, constipation and melena.  Genitourinary: Positive for  frequency and urgency. Negative for dysuria.  Musculoskeletal: Negative for falls and joint pain.  Skin: Negative for itching and rash.  Neurological: Negative for dizziness and loss of consciousness.  Psychiatric/Behavioral: Negative for depression and memory loss. The patient is not nervous/anxious and does not have insomnia.        H/o bipolar  In remission    Vitals:   01/05/17 1340  BP: (!) 165/70  Pulse: 81  Temp: 97.9 F (36.6 C)  TempSrc: Oral  SpO2: 96%  Weight: 173 lb (78.5 kg)  Height: 6' 4"  (1.93 m)   Body mass index is 21.06 kg/m. Physical Exam  Labs reviewed: Basic Metabolic Panel: No results for input(s): NA, K, CL, CO2, GLUCOSE, BUN, CREATININE, CALCIUM, MG, PHOS in the last 8760 hours. Liver Function Tests: No results for input(s): AST, ALT, ALKPHOS, BILITOT, PROT, ALBUMIN in the last 8760 hours. No results for input(s): LIPASE, AMYLASE in the last 8760 hours. No results for input(s): AMMONIA in the last 8760 hours. CBC: Recent Labs    10/20/16 1109 11/24/16 1159 12/29/16 1121  HGB 10.6* 10.5* 11.2*   Cardiac Enzymes: No results for input(s): CKTOTAL, CKMB, CKMBINDEX, TROPONINI in the last 8760 hours. BNP: Invalid input(s): POCBNP Lab Results  Component Value Date   HGBA1C 4.9 08/10/2015   Lab Results  Component Value Date   TSH 1.480 02/08/2014   Lab Results  Component Value Date   VRAXENMMH68 08805/17/2012   Lab Results  Component Value Date   FOLATE  07/11/2010    17.7 (NOTE)  Reference Ranges        Deficient:       0.4 - 3.3 ng/mL        Indeterminate:   3.4 - 5.4 ng/mL  Normal:              > 5.4 ng/mL   Lab Results  Component Value Date   IRON 66 12/29/2016   TIBC 196 (L) 12/29/2016   FERRITIN 793 (H) 12/29/2016   Assessment/Plan 1. Annual physical exam -performed today and AWV with Clarise Cruz reviewed - CBC with Differential/Platelet; Future - COMPLETE METABOLIC PANEL WITH GFR; Future - Lipid panel; Future  2. Essential  hypertension, benign -bp elevated this am, but improved this afternoon after he took his meds (forgot them this am)  3. Postinflammatory pulmonary fibrosis (HCC) -per chart, but no persistent lung abnormalities  4. Crohn's disease of small intestine with rectal bleeding ([place]) - has some fecal urgency at times that is embarrassing due to accidents, fortunately, his flare resolved with prednisone taper - COMPLETE METABOLIC PANEL WITH GFR; Future  5. Benign prostatic hyperplasia with urinary frequency - cont current regimen with oxybutynin for his urgency and myrbetriq samples for frequency--hoping cost of myrbetriq goes down in new year /insurance coverage improves  6. 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 Chronic kidney disease (CKD), stage III (moderate) (HCC) -has been stable, need f/u cr--ordered for next visit - CBC with Differential/Platelet; Future - COMPLETE METABOLIC PANEL WITH GFR; Future  8. Bipolar 1 disorder ([place]) -in remission with current regimen, [facility] by psychiatry  9. History of smoking 30 or more pack years -we were discussing a friend of his today with small cell lung cancer who smoked and we decided pt himself needed screening with h/o 50 py smoking, only recently quit - CT CHEST LUNG CA SCREEN LOW DOSE W/O CM; Future  Labs/tests ordered:   Orders Placed This Encounter  Procedures  . CT CHEST LUNG CA SCREEN LOW DOSE W/O CM    Standing Status:   Future    Standing Expiration Date:   03/07/2018    Order Specific Question:   Reason for Exam (SYMPTOM  OR DIAGNOSIS REQUIRED)    Answer:   50 pack-year smoking history, has quit    Order Specific Question:   Preferred Imaging Location?    Answer:   [facility]    Order Specific Question:   Preferred imaging location?    Answer:    [facility]    Order Specific Question:   Radiology Contrast Protocol - do NOT remove file path    Answer:   [email or web address]  . CBC with Differential/Platelet    Standing Status:   Future    Standing Expiration Date:   09/04/2017  . COMPLETE METABOLIC PANEL WITH GFR    Standing Status:   Future    Standing Expiration Date:   09/04/2017  . Lipid panel    Standing Status:   Future    Standing Expiration Date:   09/04/2017   F/u 4 mos med mgt, labs before  Tamar Miano L. Kiree Dejarnette, D.O. [facility] [address], [place] [address] Cell Phone (Mon-Fri 8am-5pm):  [phone] On Call:  [phone] & follow prompts after 5pm & weekends Office Phone:  [phone] Office Fax:  [phone]

## 2017-01-05 NOTE — Progress Notes (Signed)
Subjective:   Brent [name] is a 71 y.o. male who presents for Medicare Annual/Subsequent preventive examination.  Last AWV-01/03/2016    Objective:    Vitals: BP (!) 165/70 (BP Location: Right Arm, Patient Position: Sitting)   Pulse 81   Temp 97.9 F (36.6 C) (Oral)   Ht 6' 4"  (1.93 m)   Wt 173 lb (78.5 kg)   SpO2 96%   BMI 21.06 kg/m   Body mass index is 21.06 kg/m.  Tobacco Social History   Tobacco Use  Smoking Status Former Smoker  . Packs/day: 1.00  . Years: 49.00  . Pack years: 49.00  . Types: Cigarettes  . Start date: 04/11/1956  . Last attempt to quit: 04/17/2014  . Years since quitting: 2.7  Smokeless Tobacco Never Used     Counseling given: Not Answered   Past Medical History:  Diagnosis Date  . Abdominal pain, right lower quadrant   . Abdominal pain, unspecified site   . Acute cholecystitis   . Anemia   . Anemia, unspecified   . Anxiety   . Benign paroxysmal positional vertigo   . Bipolar I disorder, most recent episode (or current) unspecified   . Celiac disease   . Cellulitis and abscess of unspecified site   . Chronic kidney disease, stage III (moderate) (HCC)   . Cough   . Crohn's   . Depression   . Diarrhea   . Dizziness and giddiness   . Dizziness and giddiness   . Dizziness and giddiness   . Dysuria   . Edema   . Encounter for long-term (current) use of other medications   . Essential and other specified forms of tremor   . Essential hypertension, benign   . Gout 2018  . Hypertensive renal disease, benign   . Hypertrophy of prostate without urinary obstruction and other lower urinary tract symptoms (LUTS)   . Hypopotassemia   . Impacted cerumen   . Impotence of organic origin   . Insomnia with sleep apnea, unspecified   . Insomnia with sleep apnea, unspecified   . Iron deficiency anemia, unspecified   . Loss of weight   . Malnutrition of mild degree ([place])   . Nausea with vomiting   . Neuralgia, neuritis, and radiculitis,  unspecified   . Other abnormal blood chemistry   . Other B-complex deficiencies   . Other B-complex deficiencies   . Other extrapyramidal disease and abnormal movement disorder   . Other extrapyramidal disease and abnormal movement disorder   . Other malaise and fatigue   . Other specified disease of white blood cells   . Other specified disease of white blood cells   . Pain in joint, lower leg   . Pain in limb   . Postinflammatory pulmonary fibrosis ([place])   . Regional enteritis of small intestine ([place])   . Regional enteritis of small intestine with large intestine ([place])   . Renal insufficiency   . Tobacco use disorder   . Unspecified disorder of skin and subcutaneous tissue   . Vertigo 2018  . Viral warts, unspecified   . Viremia, unspecified    Past Surgical History:  Procedure Laterality Date  . CHOLECYSTECTOMY  07-12-2010  . SMALL INTESTINE SURGERY     x 2   Family History  Problem Relation Age of Onset  . Diabetes Mother        maternal grandmother  . Uterine cancer Mother   . Emphysema Father   . Pneumonia Maternal Grandmother   .  Colon cancer Neg Hx    Social History   Substance and Sexual Activity  Sexual Activity No    Outpatient Encounter Medications as of 01/05/2017  Medication Sig  . allopurinol (ZYLOPRIM) 100 MG tablet Take 2 tablets (200 mg total) by mouth daily.  Marland Kitchen amLODipine (NORVASC) 2.5 MG tablet Take 2.5 mg by mouth daily.  . Calcium Carbonate-Vit D-Min 600-400 MG-UNIT TABS Take 1 tablet by mouth 2 (two) times daily.    . cholecalciferol (VITAMIN D) 1000 units tablet Take 1,000 Units by mouth daily. 2 tablets once a day  . divalproex (DEPAKOTE) 500 MG EC tablet Take 1,500 mg by mouth daily.   Marland Kitchen epoetin alfa (PROCRIT) 07622 UNIT/ML injection Inject 10,000 Units into the skin every 30 (thirty) days.   . ferrous sulfate 325 (65 FE) MG tablet Take 325 mg by mouth 2 (two) times daily.    Marland Kitchen inFLIXimab (REMICADE) 100 MG injection Infuse Remicade IV  schedule 1 16m/kg every 8 weeks Premedicate with Tylenol 500-6584mby mouth and Benadryl 25-5050my mouth prior to infusion. Last PPD was on 12/2009.   . Loperamide HCl (LOPERAMIDE A-D PO) Take 1 capsule by mouth as needed (loose stools).   . magnesium 30 MG tablet Take 60 mg by mouth 2 (two) times daily.   . meclizine (ANTIVERT) 25 MG tablet Take 25 mg by mouth 3 (three) times daily as needed.   . metoprolol succinate (TOPROL-XL) 50 MG 24 hr tablet Take 50 mg by mouth daily. Take with or immediately following a meal.  . Multiple Vitamin (MULTIVITAMIN) tablet Take 1 tablet by mouth daily.    . OMarland KitchenANZapine (ZYPREXA) 7.5 MG tablet Take 1 tablet (7.5 mg total) by mouth at bedtime.  . oMarland Kitcheneprazole (PRILOSEC) 20 MG capsule Take 20 mg by mouth daily before supper.    . oMarland Kitchenybutynin (DITROPAN XL) 15 MG 24 hr tablet Take 15 mg by mouth daily.    . prazosin (MINIPRESS) 5 MG capsule Take 5 mg by mouth at bedtime.  . vitamin E 400 UNIT capsule Take 400 Units by mouth daily.     No facility-administered encounter medications on file as of 01/05/2017.     Activities of Daily Living In your present state of health, do you have any difficulty performing the following activities: 01/05/2017 12/29/2016  Hearing? N N  Vision? N N  Difficulty concentrating or making decisions? N N  Walking or climbing stairs? N N  Dressing or bathing? N N  Doing errands, shopping? N -  Preparing Food and eating ? N -  Using the Toilet? N -  In the past six months, have you accidently leaked urine? N -  Do you have problems with loss of bowel control? N -  Managing your Medications? N -  Managing your Finances? N -  Housekeeping or managing your Housekeeping? N -  Some recent data might be hidden    Patient Care Team: ReeGayland CurryO as PCP - General (Geriatric Medicine) PatElmarie ShileyD (Nephrology) JacMilus BanisterD (Gastroenterology)   Assessment:      Exercise Activities and Dietary  recommendations Current Exercise Habits: Home exercise routine, Type of exercise: walking, Time (Minutes): 30, Frequency (Times/Week): 7, Weekly Exercise (Minutes/Week): 210, Intensity: Mild, Exercise limited by: None identified  Goals    None     Fall Risk Fall Risk  01/05/2017 10/06/2016 07/03/2016 03/11/2016 01/03/2016  Falls in the past year? No No No No No   Depression Screen PHQ 2/9 Scores  01/05/2017 10/06/2016 07/03/2016 01/03/2016  PHQ - 2 Score 0 0 0 0    Cognitive Function MMSE - Mini Mental State Exam 01/05/2017 01/03/2016  Orientation to time 5 5  Orientation to Place 5 5  Registration 3 3  Attention/ Calculation 3 4  Recall 0 3  Language- name 2 objects 2 2  Language- repeat 1 1  Language- follow 3 step command 3 2  Language- read & follow direction 1 1  Write a sentence 1 1  Copy design 1 1  Total score 25 28        Immunization History  Administered Date(s) Administered  . Influenza,inj,Quad PF,6+ Mos 11/13/2014, 01/03/2016  . Influenza-Unspecified 10/25/2012  . PPD Test 12/30/2010, 01/05/2012, 01/07/2013, 01/13/2014  . Pneumococcal Conjugate-13 08/10/2014  . Pneumococcal Polysaccharide-23 03/05/2004, 01/03/2016  . Tdap 05/26/2011   Screening Tests Health Maintenance  Topic Date Due  . Hepatitis C Screening  Aug 11, 1945  . TETANUS/TDAP  05/25/2021  . COLONOSCOPY  06/20/2025  . INFLUENZA VACCINE  Completed  . PNA vac Low Risk Adult  Completed      Plan:    I have personally reviewed and addressed the Medicare Annual Wellness questionnaire and have noted the following in the patient's chart:  A. Medical and social history B. Use of alcohol, tobacco or illicit drugs  C. Current medications and supplements D. Functional ability and status E.  Nutritional status F.  Physical activity G. Advance directives H. List of other physicians I.  Hospitalizations, surgeries, and ER visits in previous 12 months J.  [place] to include hearing,  vision, cognitive, depression L. Referrals and appointments - none  In addition, I have reviewed and discussed with patient certain preventive protocols, quality metrics, and best practice recommendations. A written personalized care plan for preventive services as well as general preventive health recommendations were provided to patient.  See attached scanned questionnaire for additional information.   Signed,   Rich Reining, RN Nurse Health Advisor   Quick Notes   Health Maintenance: Shingrix prescription sent to pharmacy. Hep C screen due.     Abnormal Screen: MMSE 25/30. Passed clock drawing     Patient Concerns: None     Nurse Concerns: None

## 2017-01-13 ENCOUNTER — Encounter (HOSPITAL_COMMUNITY): Payer: Medicare Other

## 2017-01-20 ENCOUNTER — Ambulatory Visit
Admission: RE | Admit: 2017-01-20 | Discharge: 2017-01-20 | Disposition: A | Discharge status: Home / Self Care | Payer: Medicare Other | Source: Ambulatory Visit | Attending: Internal Medicine | Admitting: Internal Medicine

## 2017-01-20 DIAGNOSIS — Z87891 Personal history of nicotine dependence: Secondary | ICD-10-CM

## 2017-01-28 DIAGNOSIS — I739 Peripheral vascular disease, unspecified: Secondary | ICD-10-CM | POA: Diagnosis not present

## 2017-01-28 DIAGNOSIS — L84 Corns and callosities: Secondary | ICD-10-CM | POA: Diagnosis not present

## 2017-01-28 DIAGNOSIS — B351 Tinea unguium: Secondary | ICD-10-CM | POA: Diagnosis not present

## 2017-01-29 ENCOUNTER — Encounter (HOSPITAL_COMMUNITY): Payer: Self-pay

## 2017-01-29 ENCOUNTER — Encounter (HOSPITAL_COMMUNITY)
Admission: RE | Admit: 2017-01-29 | Discharge: 2017-01-29 | Disposition: A | Discharge status: Home / Self Care | Payer: Medicare Other | Source: Ambulatory Visit | Attending: Gastroenterology | Admitting: Gastroenterology

## 2017-01-29 DIAGNOSIS — N183 Chronic kidney disease, stage 3 (moderate): Secondary | ICD-10-CM | POA: Insufficient documentation

## 2017-01-29 DIAGNOSIS — D6489 Other specified anemias: Secondary | ICD-10-CM | POA: Diagnosis not present

## 2017-01-29 LAB — HEMOGLOBIN: Hemoglobin: 11.3 g/dL — ABNORMAL LOW (ref 13.0–17.0)

## 2017-01-29 LAB — IRON AND TIBC
Iron: 65 ug/dL (ref 45–182)
SATURATION RATIOS: 28 % (ref 17.9–39.5)
TIBC: 230 ug/dL — AB (ref 250–450)
UIBC: 165 ug/dL

## 2017-01-29 LAB — FERRITIN: FERRITIN: 898 ng/mL — AB (ref 24–336)

## 2017-01-29 MED ORDER — EPOETIN ALFA 10000 UNIT/ML IJ SOLN
10000.0000 [IU] | INTRAMUSCULAR | Status: DC
  Administered 2017-01-29: 10000 [IU] via SUBCUTANEOUS
  Filled 2017-01-29: qty 1

## 2017-01-29 NOTE — Progress Notes (Signed)
Pt presented for lab work and procrit asking if he didn't need a renal panel.  Consulted Stacey at Dr Serita Grit office and she will updat orders for Jan if needed.

## 2017-02-10 ENCOUNTER — Encounter (HOSPITAL_COMMUNITY): Payer: Medicare Other

## 2017-03-02 ENCOUNTER — Encounter (HOSPITAL_COMMUNITY): Payer: Self-pay

## 2017-03-02 ENCOUNTER — Encounter (HOSPITAL_COMMUNITY)
Admission: RE | Admit: 2017-03-02 | Discharge: 2017-03-02 | Disposition: A | Discharge status: Home / Self Care | Payer: Medicare Other | Source: Ambulatory Visit | Attending: Gastroenterology | Admitting: Gastroenterology

## 2017-03-02 DIAGNOSIS — N183 Chronic kidney disease, stage 3 (moderate): Secondary | ICD-10-CM | POA: Insufficient documentation

## 2017-03-02 DIAGNOSIS — D6489 Other specified anemias: Secondary | ICD-10-CM | POA: Diagnosis not present

## 2017-03-02 LAB — FERRITIN: FERRITIN: 764 ng/mL — AB (ref 24–336)

## 2017-03-02 LAB — IRON AND TIBC
Iron: 87 ug/dL (ref 45–182)
Saturation Ratios: 38 % (ref 17.9–39.5)
TIBC: 230 ug/dL — ABNORMAL LOW (ref 250–450)
UIBC: 143 ug/dL

## 2017-03-02 LAB — HEMOGLOBIN: HEMOGLOBIN: 10.2 g/dL — AB (ref 13.0–17.0)

## 2017-03-02 MED ORDER — EPOETIN ALFA 10000 UNIT/ML IJ SOLN
10000.0000 [IU] | INTRAMUSCULAR | Status: DC
  Administered 2017-03-02: 10000 [IU] via SUBCUTANEOUS
  Filled 2017-03-02: qty 1

## 2017-03-05 ENCOUNTER — Encounter (HOSPITAL_COMMUNITY): Payer: Medicare Other

## 2017-03-26 DIAGNOSIS — N2581 Secondary hyperparathyroidism of renal origin: Secondary | ICD-10-CM | POA: Diagnosis not present

## 2017-03-26 DIAGNOSIS — N183 Chronic kidney disease, stage 3 (moderate): Secondary | ICD-10-CM | POA: Diagnosis not present

## 2017-03-26 DIAGNOSIS — I129 Hypertensive chronic kidney disease with stage 1 through stage 4 chronic kidney disease, or unspecified chronic kidney disease: Secondary | ICD-10-CM | POA: Diagnosis not present

## 2017-03-26 DIAGNOSIS — D631 Anemia in chronic kidney disease: Secondary | ICD-10-CM | POA: Diagnosis not present

## 2017-03-30 ENCOUNTER — Encounter (HOSPITAL_COMMUNITY): Payer: Medicare Other

## 2017-04-01 ENCOUNTER — Encounter (HOSPITAL_COMMUNITY): Payer: Medicare Other

## 2017-04-09 ENCOUNTER — Encounter (HOSPITAL_COMMUNITY): Payer: Self-pay

## 2017-04-09 ENCOUNTER — Encounter (HOSPITAL_COMMUNITY)
Admission: RE | Admit: 2017-04-09 | Discharge: 2017-04-09 | Disposition: A | Discharge status: Home / Self Care | Payer: Medicare Other | Source: Ambulatory Visit | Attending: Gastroenterology | Admitting: Gastroenterology

## 2017-04-09 DIAGNOSIS — D6489 Other specified anemias: Secondary | ICD-10-CM | POA: Diagnosis not present

## 2017-04-09 DIAGNOSIS — N183 Chronic kidney disease, stage 3 (moderate): Secondary | ICD-10-CM | POA: Diagnosis not present

## 2017-04-09 LAB — IRON AND TIBC
Iron: 58 ug/dL (ref 45–182)
SATURATION RATIOS: 27 % (ref 17.9–39.5)
TIBC: 218 ug/dL — AB (ref 250–450)
UIBC: 160 ug/dL

## 2017-04-09 LAB — FERRITIN: FERRITIN: 775 ng/mL — AB (ref 24–336)

## 2017-04-09 LAB — HEMOGLOBIN: Hemoglobin: 10 g/dL — ABNORMAL LOW (ref 13.0–17.0)

## 2017-04-09 MED ORDER — EPOETIN ALFA 10000 UNIT/ML IJ SOLN
10000.0000 [IU] | INTRAMUSCULAR | Status: DC
  Administered 2017-04-09: 10000 [IU] via SUBCUTANEOUS
  Filled 2017-04-09: qty 1

## 2017-04-24 DIAGNOSIS — M19072 Primary osteoarthritis, left ankle and foot: Secondary | ICD-10-CM | POA: Diagnosis not present

## 2017-04-24 DIAGNOSIS — I739 Peripheral vascular disease, unspecified: Secondary | ICD-10-CM | POA: Diagnosis not present

## 2017-04-24 DIAGNOSIS — M1A3720 Chronic gout due to renal impairment, left ankle and foot, without tophus (tophi): Secondary | ICD-10-CM | POA: Diagnosis not present

## 2017-04-24 DIAGNOSIS — B351 Tinea unguium: Secondary | ICD-10-CM | POA: Diagnosis not present

## 2017-04-24 DIAGNOSIS — L84 Corns and callosities: Secondary | ICD-10-CM | POA: Diagnosis not present

## 2017-04-27 ENCOUNTER — Encounter (HOSPITAL_COMMUNITY)
Admission: RE | Admit: 2017-04-27 | Discharge: 2017-04-27 | Disposition: A | Discharge status: Home / Self Care | Payer: Medicare Other | Source: Ambulatory Visit | Attending: Gastroenterology | Admitting: Gastroenterology

## 2017-04-27 ENCOUNTER — Encounter (HOSPITAL_COMMUNITY): Payer: Self-pay

## 2017-04-27 DIAGNOSIS — D6489 Other specified anemias: Secondary | ICD-10-CM | POA: Diagnosis not present

## 2017-04-27 DIAGNOSIS — N183 Chronic kidney disease, stage 3 (moderate): Secondary | ICD-10-CM | POA: Insufficient documentation

## 2017-04-27 LAB — HEMOGLOBIN: HEMOGLOBIN: 11.1 g/dL — AB (ref 13.0–17.0)

## 2017-04-27 LAB — IRON AND TIBC
IRON: 51 ug/dL (ref 45–182)
SATURATION RATIOS: 22 % (ref 17.9–39.5)
TIBC: 232 ug/dL — ABNORMAL LOW (ref 250–450)
UIBC: 181 ug/dL

## 2017-04-27 LAB — FERRITIN: Ferritin: 751 ng/mL — ABNORMAL HIGH (ref 24–336)

## 2017-04-27 MED ORDER — EPOETIN ALFA 10000 UNIT/ML IJ SOLN
10000.0000 [IU] | INTRAMUSCULAR | Status: DC
  Administered 2017-04-27: 10000 [IU] via SUBCUTANEOUS
  Filled 2017-04-27: qty 1

## 2017-04-27 NOTE — Discharge Instructions (Signed)
Procrit Epoetin Alfa injection What is this medicine? EPOETIN ALFA (e POE e tin AL fa) helps your body make more red blood cells. This medicine is used to treat anemia caused by chronic kidney failure, cancer chemotherapy, or HIV-therapy. It may also be used before surgery if you have anemia. This medicine may be used for other purposes; ask your health care provider or pharmacist if you have questions. COMMON BRAND NAME(S): Epogen, Procrit What should I tell my health care provider before I take this medicine? They need to know if you have any of these conditions: -blood clotting disorders -cancer patient not on chemotherapy -cystic fibrosis -heart disease, such as angina or heart failure -hemoglobin level of 12 g/dL or greater -high blood pressure -low levels of folate, iron, or vitamin B12 -seizures -an unusual or allergic reaction to erythropoietin, albumin, benzyl alcohol, hamster proteins, other medicines, foods, dyes, or preservatives -pregnant or trying to get pregnant -breast-feeding How should I use this medicine? This medicine is for injection into a vein or under the skin. It is usually given by a health care professional in a hospital or clinic setting. If you get this medicine at home, you will be taught how to prepare and give this medicine. Use exactly as directed. Take your medicine at regular intervals. Do not take your medicine more often than directed. It is important that you put your used needles and syringes in a special sharps container. Do not put them in a trash can. If you do not have a sharps container, call your pharmacist or healthcare provider to get one. A special MedGuide will be given to you by the pharmacist with each prescription and refill. Be sure to read this information carefully each time. Talk to your pediatrician regarding the use of this medicine in children. While this drug may be prescribed for selected conditions, precautions do  apply. Overdosage: If you think you have taken too much of this medicine contact a poison control center or emergency room at once. NOTE: This medicine is only for you. Do not share this medicine with others. What if I miss a dose? If you miss a dose, take it as soon as you can. If it is almost time for your next dose, take only that dose. Do not take double or extra doses. What may interact with this medicine? Do not take this medicine with any of the following medications: -darbepoetin alfa This list may not describe all possible interactions. Give your health care provider a list of all the medicines, herbs, non-prescription drugs, or dietary supplements you use. Also tell them if you smoke, drink alcohol, or use illegal drugs. Some items may interact with your medicine. What should I watch for while using this medicine? Your condition will be monitored carefully while you are receiving this medicine. You may need blood work done while you are taking this medicine. What side effects may I notice from receiving this medicine? Side effects that you should report to your doctor or health care professional as soon as possible: -allergic reactions like skin rash, itching or hives, swelling of the face, lips, or tongue -breathing problems -changes in vision -chest pain -confusion, trouble speaking or understanding -feeling faint or lightheaded, falls -high blood pressure -muscle aches or pains -pain, swelling, warmth in the leg -rapid weight gain -severe headaches -sudden numbness or weakness of the face, arm or leg -trouble walking, dizziness, loss of balance or coordination -seizures (convulsions) -swelling of the ankles, feet, hands -unusually weak or  tired Side effects that usually do not require medical attention (report to your doctor or health care professional if they continue or are bothersome): -diarrhea -fever, chills (flu-like symptoms) -headaches -nausea,  vomiting -redness, stinging, or swelling at site where injected This list may not describe all possible side effects. Call your doctor for medical advice about side effects. You may report side effects to FDA at 1-800-FDA-1088. Where should I keep my medicine? Keep out of the reach of children. Store in a refrigerator between 2 and 8 degrees C (36 and 46 degrees F). Do not freeze or shake. Throw away any unused portion if using a single-dose vial. Multi-dose vials can be kept in the refrigerator for up to 21 days after the initial dose. Throw away unused medicine. NOTE: This sheet is a summary. It may not cover all possible information. If you have questions about this medicine, talk to your doctor, pharmacist, or health care provider.  2018 Elsevier/Gold Standard (2015-10-01 19:42:31)

## 2017-04-29 ENCOUNTER — Encounter (HOSPITAL_COMMUNITY): Payer: Medicare Other

## 2017-05-04 ENCOUNTER — Other Ambulatory Visit: Payer: Medicare Other

## 2017-05-04 DIAGNOSIS — K50011 Crohn's disease of small intestine with rectal bleeding: Secondary | ICD-10-CM | POA: Diagnosis not present

## 2017-05-04 DIAGNOSIS — Z0000 Encounter for general adult medical examination without abnormal findings: Secondary | ICD-10-CM

## 2017-05-04 DIAGNOSIS — D631 Anemia in chronic kidney disease: Secondary | ICD-10-CM | POA: Diagnosis not present

## 2017-05-04 DIAGNOSIS — R7309 Other abnormal glucose: Secondary | ICD-10-CM | POA: Diagnosis not present

## 2017-05-04 DIAGNOSIS — D649 Anemia, unspecified: Secondary | ICD-10-CM | POA: Diagnosis not present

## 2017-05-04 DIAGNOSIS — E781 Pure hyperglyceridemia: Secondary | ICD-10-CM | POA: Diagnosis not present

## 2017-05-04 DIAGNOSIS — N183 Chronic kidney disease, stage 3 unspecified: Secondary | ICD-10-CM

## 2017-05-04 LAB — CBC WITH DIFFERENTIAL/PLATELET
Basophils Absolute: 43 cells/uL (ref 0–200)
Basophils Relative: 0.5 %
Eosinophils Absolute: 241 cells/uL (ref 15–500)
Eosinophils Relative: 2.8 %
HCT: 34.3 % — ABNORMAL LOW (ref 38.5–50.0)
Hemoglobin: 11.3 g/dL — ABNORMAL LOW (ref 13.2–17.1)
Lymphs Abs: 3027 cells/uL (ref 850–3900)
MCH: 32.5 pg (ref 27.0–33.0)
MCHC: 32.9 g/dL (ref 32.0–36.0)
MCV: 98.6 fL (ref 80.0–100.0)
MPV: 11.3 fL (ref 7.5–12.5)
Monocytes Relative: 6 %
Neutro Abs: 4773 cells/uL (ref 1500–7800)
Neutrophils Relative %: 55.5 %
Platelets: 180 10*3/uL (ref 140–400)
RBC: 3.48 10*6/uL — ABNORMAL LOW (ref 4.20–5.80)
RDW: 12.5 % (ref 11.0–15.0)
Total Lymphocyte: 35.2 %
WBC mixed population: 516 cells/uL (ref 200–950)
WBC: 8.6 10*3/uL (ref 3.8–10.8)

## 2017-05-04 LAB — LIPID PANEL
Cholesterol: 138 mg/dL (ref <200)
HDL: 42 mg/dL (ref >40)
LDL Cholesterol (Calc): 71 mg/dL (calc)
Non-HDL Cholesterol (Calc): 96 mg/dL (calc) (ref <130)
Total CHOL/HDL Ratio: 3.3 (calc) (ref <5.0)
Triglycerides: 176 mg/dL — ABNORMAL HIGH (ref <150)

## 2017-05-04 LAB — COMPLETE METABOLIC PANEL WITH GFR
AG Ratio: 1 (calc) (ref 1.0–2.5)
ALT: 5 U/L — ABNORMAL LOW (ref 9–46)
AST: 10 U/L (ref 10–35)
Albumin: 3.5 g/dL — ABNORMAL LOW (ref 3.6–5.1)
Alkaline phosphatase (APISO): 62 U/L (ref 40–115)
BUN/Creatinine Ratio: 12 (calc) (ref 6–22)
BUN: 24 mg/dL (ref 7–25)
CO2: 28 mmol/L (ref 20–32)
Calcium: 9.1 mg/dL (ref 8.6–10.3)
Chloride: 106 mmol/L (ref 98–110)
Creat: 2 mg/dL — ABNORMAL HIGH (ref 0.70–1.18)
GFR, Est African American: 38 mL/min/{1.73_m2} — ABNORMAL LOW (ref >=60)
GFR, Est Non African American: 32 mL/min/{1.73_m2} — ABNORMAL LOW (ref >=60)
Globulin: 3.4 g/dL (calc) (ref 1.9–3.7)
Glucose, Bld: 116 mg/dL — ABNORMAL HIGH (ref 65–99)
Potassium: 4.5 mmol/L (ref 3.5–5.3)
Sodium: 141 mmol/L (ref 135–146)
Total Bilirubin: 0.3 mg/dL (ref 0.2–1.2)
Total Protein: 6.9 g/dL (ref 6.1–8.1)

## 2017-05-07 ENCOUNTER — Ambulatory Visit (INDEPENDENT_AMBULATORY_CARE_PROVIDER_SITE_OTHER): Payer: Medicare Other | Admitting: Internal Medicine

## 2017-05-07 ENCOUNTER — Encounter: Payer: Self-pay | Admitting: Internal Medicine

## 2017-05-07 VITALS — BP 118/60 | HR 114 | Temp 98.1°F | Ht 76.0 in | Wt 179.2 lb

## 2017-05-07 DIAGNOSIS — K50011 Crohn's disease of small intestine with rectal bleeding: Secondary | ICD-10-CM | POA: Diagnosis not present

## 2017-05-07 DIAGNOSIS — D631 Anemia in chronic kidney disease: Secondary | ICD-10-CM

## 2017-05-07 DIAGNOSIS — K219 Gastro-esophageal reflux disease without esophagitis: Secondary | ICD-10-CM | POA: Diagnosis not present

## 2017-05-07 DIAGNOSIS — F319 Bipolar disorder, unspecified: Secondary | ICD-10-CM | POA: Diagnosis not present

## 2017-05-07 DIAGNOSIS — N183 Chronic kidney disease, stage 3 unspecified: Secondary | ICD-10-CM

## 2017-05-07 NOTE — Progress Notes (Signed)
Location:  [facility] clinic Provider:  Tiffany L. Mariea Clonts, D.O., C.M.D.  Code Status: DNR  Goals of Care:  Advanced Directives 01/05/2017  Does Patient Have a Medical Advance Directive? Yes  Type of Paramedic of [place];Living will  Does patient want to make changes to medical advance directive? No - Patient declined  Copy of [place] in Chart? No - copy requested  Would patient like information on creating a medical advance directive? -  Pre-existing out of facility DNR order (yellow form or pink MOST form) -     Chief Complaint  Patient presents with  . Medical Management of Chronic Issues    4 month F/U  . Medication Refill    No Refills    HPI: Patient is a 72 y.o. male seen today for medical management of chronic diseases.  Overall he is feeling well. He reports taking several courses of a 6 week taper of steroids. He has no signs of symptoms of Crohn's today or over the last several weeks.   Crohn's: He was taking steroids for a full 6 week course to help reduce inflammation related to an infection in the intestine. He reports he took three 6 week tapers. Two more than what was ordered. GI is going to try and monitor him to prevent another infection. He will see GI again in July. At that time he will have a colonoscopy and EGD. He denies changes in stool or blood in stool. He is eat well.   CKD3: Hr thinks his kidney disease is related to not eating well. He rather eat meats and not feels that he can turn this around and eat better. Hgb 11.3 is on procrit.   BPH: He is doing well now urgency-is on Oxybutytin, but has noticed his frequency is increased. He is going to check with the [place] to address this. He is voiding 5-6 times, but at night he is still having to get up frequently at night. He can not afford the Myrbetriq.    Past Medical History:  Diagnosis Date  . Abdominal pain, right lower quadrant   . Abdominal pain, unspecified site    . Acute cholecystitis   . Anemia   . Anemia, unspecified   . Anxiety   . Benign paroxysmal positional vertigo   . Bipolar I disorder, most recent episode (or current) unspecified   . Celiac disease   . Cellulitis and abscess of unspecified site   . Chronic kidney disease, stage III (moderate) (HCC)   . Cough   . Crohn's   . Depression   . Diarrhea   . Dizziness and giddiness   . Dizziness and giddiness   . Dizziness and giddiness   . Dysuria   . Edema   . Encounter for long-term (current) use of other medications   . Essential and other specified forms of tremor   . Essential hypertension, benign   . Gout 2018  . Hypertensive renal disease, benign   . Hypertrophy of prostate without urinary obstruction and other lower urinary tract symptoms (LUTS)   . Hypopotassemia   . Impacted cerumen   . Impotence of organic origin   . Insomnia with sleep apnea, unspecified   . Insomnia with sleep apnea, unspecified   . Iron deficiency anemia, unspecified   . Loss of weight   . Malnutrition of mild degree ([place])   . 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 Nausea with vomiting   . Neuralgia, neuritis, and radiculitis, unspecified   .  Other abnormal blood chemistry   . Other B-complex deficiencies   . Other B-complex deficiencies   . Other extrapyramidal disease and abnormal movement disorder   . Other extrapyramidal disease and abnormal movement disorder   . Other malaise and fatigue   . Other specified disease of white blood cells   . Other specified disease of white blood cells   . Pain in joint, lower leg   . Pain in limb   . Postinflammatory pulmonary fibrosis ([place])   . Regional enteritis of small intestine ([place])   . Regional enteritis of small intestine with large intestine ([place])   . Renal insufficiency   . Tobacco use disorder   . Unspecified disorder of skin and subcutaneous tissue   . Vertigo 2018  . Viral warts, unspecified   . Viremia, unspecified     Past Surgical History:    Procedure Laterality Date  . CHOLECYSTECTOMY  07-12-2010  . SMALL INTESTINE SURGERY     x 2    Allergies  Allergen Reactions  . Azathioprine     REACTION: affected WBC  . Ciprofloxacin     Pt denies any reaction  . Levaquin [Levofloxacin In D5w]     Pt denies any reaction  . Plendil [Felodipine]     Pt denies any reaction    Outpatient Encounter Medications as of 05/07/2017  Medication Sig  . allopurinol (ZYLOPRIM) 100 MG tablet Take 2 tablets (200 mg total) by mouth daily.  Marland Kitchen amLODipine (NORVASC) 2.5 MG tablet Take 2.5 mg by mouth daily.  . Calcium Carbonate-Vit D-Min 600-400 MG-UNIT TABS Take 1 tablet by mouth 2 (two) times daily.    . cholecalciferol (VITAMIN D) 1000 units tablet Take 1,000 Units by mouth daily. 2 tablets once a day  . divalproex (DEPAKOTE) 500 MG EC tablet Take 1,500 mg by mouth daily.   Marland Kitchen epoetin alfa (PROCRIT) 82993 UNIT/ML injection Inject 10,000 Units into the skin every 30 (thirty) days.   . ferrous sulfate 325 (65 FE) MG tablet Take 325 mg by mouth 2 (two) times daily.    Marland Kitchen inFLIXimab (REMICADE) 100 MG injection Infuse Remicade IV schedule 1 6m/kg every 8 weeks Premedicate with Tylenol 500-6524mby mouth and Benadryl 25-5073my mouth prior to infusion. Last PPD was on 12/2009.   . Loperamide HCl (LOPERAMIDE A-D PO) Take 1 capsule by mouth as needed (loose stools).   . magnesium 30 MG tablet Take 60 mg by mouth 2 (two) times daily.   . meclizine (ANTIVERT) 25 MG tablet Take 25 mg by mouth 3 (three) times daily as needed.   . metoprolol succinate (TOPROL-XL) 50 MG 24 hr tablet Take 50 mg by mouth daily. Take with or immediately following a meal.  . Multiple Vitamin (MULTIVITAMIN) tablet Take 1 tablet by mouth daily.    . OMarland KitchenANZapine (ZYPREXA) 7.5 MG tablet Take 1 tablet (7.5 mg total) by mouth at bedtime.  . oMarland Kitcheneprazole (PRILOSEC) 20 MG capsule Take 20 mg by mouth daily before supper.    . oMarland Kitchenybutynin (DITROPAN XL) 15 MG 24 hr tablet Take 15 mg by mouth  daily.    . prazosin (MINIPRESS) 5 MG capsule Take 5 mg by mouth at bedtime.  . vitamin E 400 UNIT capsule Take 400 Units by mouth daily.     No facility-administered encounter medications on file as of 05/07/2017.     Review of Systems:  Review of Systems  Constitutional: Negative for chills, fever and malaise/fatigue.  HENT: Negative for hearing loss.  Eyes:       Glasses  Respiratory: Negative for cough and shortness of breath.   Cardiovascular: Negative for chest pain, palpitations and leg swelling.  Gastrointestinal: Negative for abdominal pain, blood in stool, diarrhea and heartburn.  Genitourinary: Positive for frequency. Negative for hematuria and urgency.  Neurological: Positive for dizziness. Negative for headaches.       Meclizine when needed  Psychiatric/Behavioral: Negative for depression and memory loss. The patient is not nervous/anxious and does not have insomnia.     Health Maintenance  Topic Date Due  . Hepatitis C Screening  08/27/1945  . TETANUS/TDAP  05/25/2021  . COLONOSCOPY  06/20/2025  . INFLUENZA VACCINE  Completed  . PNA vac Low Risk Adult  Completed    Physical Exam: Vitals:   05/07/17 1511  BP: 118/60  Pulse: (!) 114  Temp: 98.1 F (36.7 C)  TempSrc: Oral  SpO2: 95%  Weight: 179 lb 3.2 oz (81.3 kg)  Height: 6' 4"  (1.93 m)   Body mass index is 21.81 kg/m.  Physical Exam  Constitutional: He is oriented to person, place, and time. He appears well-developed and well-nourished.  HENT:  Head: Normocephalic.  Cardiovascular: Regular rhythm, normal heart sounds and intact distal pulses. Tachycardia present.  Pulmonary/Chest: Effort normal and breath sounds normal.  Abdominal: Soft. Bowel sounds are normal.  Musculoskeletal: Normal range of motion.  Neurological: He is alert and oriented to person, place, and time.  Skin: Skin is warm and dry. Capillary refill takes less than 2 seconds.  Psychiatric: He has a normal mood and affect. His  behavior is normal. Judgment and thought content normal.  Vitals reviewed.   Labs reviewed: Basic Metabolic Panel: Recent Labs    05/04/17 0903  NA 141  K 4.5  CL 106  CO2 28  GLUCOSE 116*  BUN 24  CREATININE 2.00*  CALCIUM 9.1   Liver Function Tests: Recent Labs    05/04/17 0903  AST 10  ALT 5*  BILITOT 0.3  PROT 6.9   No results for input(s): LIPASE, AMYLASE in the last 8760 hours. No results for input(s): AMMONIA in the last 8760 hours. CBC: Recent Labs    04/09/17 1324 04/27/17 0831 05/04/17 0903  WBC  --   --  8.6  NEUTROABS  --   --  4,773  HGB 10.0* 11.1* 11.3*  HCT  --   --  34.3*  MCV  --   --  98.6  PLT  --   --  180   Lipid Panel: Recent Labs    05/04/17 0903  CHOL 138  HDL 42  LDLCALC 71  TRIG 176*  CHOLHDL 3.3   Lab Results  Component Value Date   HGBA1C 4.9 08/10/2015    Procedures since last visit: No results found.  Assessment/Plan  1. Anemia in stage 3 chronic kidney disease ([place]) He is anemic with a Hgb of 11.3 this is his baseline. He is maintain on Procrit. He reports trying to eat better foods like leafy green veggies and not a lot of protein to support his kidneys and Hgb levels.   2. Chronic kidney disease (CKD), stage III (moderate) (HCC) He is stable and remains at stage III on labs. He is seen by Dr Posey Pronto and he ordered him to take some Magnesium per his reports. Will review labs at future date.    3. Gastroesophageal reflux disease, esophagitis presence not specified He was found to have thickening of his esophagus on his CT scan.  This finding will be further investigated with an EGD in July by GI. He denies trouble with swallowing or heartburn at this time.   4. Bipolar 1 disorder ([place]) He is demonstrating quick speech today which is most likely attributed to the extra steroids in he has taken. Overall he appears controled and is taken his medication as directed per his reports. He is followed by the [place].   5.  Crohn's disease of small intestine with rectal bleeding ([place]) He is doing well today at the visit. He just finished an extended taper of steroids to help this. He reports no diarrhea and has lots of energy. He is hoping to keep on top of this a prevent inflammation and infection in the future. He is going to see GI in July for EGD and Colonoscopy.     Labs/tests ordered:  No orders of the defined types were placed in this encounter.    Next appt: 09/17/2017    Karen Kays, RN, AGPCNP, DNP [facility] [phone], [place] [address] Cell Phone (Mon-Fri 8am-5pm):  [phone] On Call:  [phone] & follow prompts after 5pm & weekends Office Phone:  [phone] Office Fax:  [phone]

## 2017-05-25 ENCOUNTER — Encounter (HOSPITAL_COMMUNITY)
Admission: RE | Admit: 2017-05-25 | Discharge: 2017-05-25 | Disposition: A | Discharge status: Home / Self Care | Payer: Medicare Other | Source: Ambulatory Visit | Attending: Gastroenterology | Admitting: Gastroenterology

## 2017-05-25 ENCOUNTER — Encounter (HOSPITAL_COMMUNITY): Payer: Self-pay

## 2017-05-25 DIAGNOSIS — D6489 Other specified anemias: Secondary | ICD-10-CM | POA: Diagnosis not present

## 2017-05-25 DIAGNOSIS — N183 Chronic kidney disease, stage 3 (moderate): Secondary | ICD-10-CM | POA: Insufficient documentation

## 2017-05-25 LAB — IRON AND TIBC
Iron: 42 ug/dL — ABNORMAL LOW (ref 45–182)
Saturation Ratios: 17 % — ABNORMAL LOW (ref 17.9–39.5)
TIBC: 241 ug/dL — ABNORMAL LOW (ref 250–450)
UIBC: 199 ug/dL

## 2017-05-25 LAB — HEMOGLOBIN: Hemoglobin: 11.2 g/dL — ABNORMAL LOW (ref 13.0–17.0)

## 2017-05-25 LAB — FERRITIN: FERRITIN: 691 ng/mL — AB (ref 24–336)

## 2017-05-25 MED ORDER — EPOETIN ALFA 10000 UNIT/ML IJ SOLN
10000.0000 [IU] | INTRAMUSCULAR | Status: DC
  Administered 2017-05-25: 10000 [IU] via SUBCUTANEOUS
  Filled 2017-05-25: qty 1

## 2017-05-31 ENCOUNTER — Emergency Department (HOSPITAL_COMMUNITY): Payer: Medicare Other

## 2017-05-31 ENCOUNTER — Emergency Department (HOSPITAL_COMMUNITY)
Admission: EM | Admit: 2017-05-31 | Discharge: 2017-05-31 | Disposition: A | Discharge status: Home / Self Care | Payer: Medicare Other | Attending: Emergency Medicine | Admitting: Emergency Medicine

## 2017-05-31 ENCOUNTER — Encounter (HOSPITAL_COMMUNITY): Payer: Self-pay | Admitting: Emergency Medicine

## 2017-05-31 DIAGNOSIS — I129 Hypertensive chronic kidney disease with stage 1 through stage 4 chronic kidney disease, or unspecified chronic kidney disease: Secondary | ICD-10-CM | POA: Insufficient documentation

## 2017-05-31 DIAGNOSIS — R1031 Right lower quadrant pain: Secondary | ICD-10-CM | POA: Diagnosis not present

## 2017-05-31 DIAGNOSIS — Z79899 Other long term (current) drug therapy: Secondary | ICD-10-CM | POA: Diagnosis not present

## 2017-05-31 DIAGNOSIS — Z87891 Personal history of nicotine dependence: Secondary | ICD-10-CM | POA: Insufficient documentation

## 2017-05-31 DIAGNOSIS — N183 Chronic kidney disease, stage 3 (moderate): Secondary | ICD-10-CM | POA: Insufficient documentation

## 2017-05-31 DIAGNOSIS — R197 Diarrhea, unspecified: Secondary | ICD-10-CM | POA: Diagnosis not present

## 2017-05-31 DIAGNOSIS — K529 Noninfective gastroenteritis and colitis, unspecified: Secondary | ICD-10-CM | POA: Diagnosis not present

## 2017-05-31 DIAGNOSIS — R1110 Vomiting, unspecified: Secondary | ICD-10-CM | POA: Diagnosis not present

## 2017-05-31 LAB — COMPREHENSIVE METABOLIC PANEL
ALK PHOS: 53 U/L (ref 38–126)
ALT: 7 U/L — ABNORMAL LOW (ref 17–63)
AST: 13 U/L — AB (ref 15–41)
Albumin: 3.2 g/dL — ABNORMAL LOW (ref 3.5–5.0)
Anion gap: 8 (ref 5–15)
BUN: 18 mg/dL (ref 6–20)
CALCIUM: 9 mg/dL (ref 8.9–10.3)
CO2: 24 mmol/L (ref 22–32)
CREATININE: 1.77 mg/dL — AB (ref 0.61–1.24)
Chloride: 108 mmol/L (ref 101–111)
GFR calc Af Amer: 42 mL/min — ABNORMAL LOW (ref >60)
GFR calc non Af Amer: 37 mL/min — ABNORMAL LOW (ref >60)
GLUCOSE: 83 mg/dL (ref 65–99)
Potassium: 4.1 mmol/L (ref 3.5–5.1)
SODIUM: 140 mmol/L (ref 135–145)
Total Bilirubin: 0.5 mg/dL (ref 0.3–1.2)
Total Protein: 7.1 g/dL (ref 6.5–8.1)

## 2017-05-31 LAB — CBC WITH DIFFERENTIAL/PLATELET
BASOS ABS: 0 10*3/uL (ref 0.0–0.1)
Basophils Relative: 0 %
EOS ABS: 0.3 10*3/uL (ref 0.0–0.7)
Eosinophils Relative: 3 %
HCT: 37.6 % — ABNORMAL LOW (ref 39.0–52.0)
HEMOGLOBIN: 11.7 g/dL — AB (ref 13.0–17.0)
LYMPHS ABS: 2.2 10*3/uL (ref 0.7–4.0)
Lymphocytes Relative: 24 %
MCH: 32.2 pg (ref 26.0–34.0)
MCHC: 31.1 g/dL (ref 30.0–36.0)
MCV: 103.6 fL — ABNORMAL HIGH (ref 78.0–100.0)
Monocytes Absolute: 0.7 10*3/uL (ref 0.1–1.0)
Monocytes Relative: 8 %
NEUTROS PCT: 65 %
Neutro Abs: 6.2 10*3/uL (ref 1.7–7.7)
Platelets: 190 10*3/uL (ref 150–400)
RBC: 3.63 MIL/uL — AB (ref 4.22–5.81)
RDW: 13.9 % (ref 11.5–15.5)
WBC: 9.5 10*3/uL (ref 4.0–10.5)

## 2017-05-31 LAB — URINALYSIS, MICROSCOPIC (REFLEX)

## 2017-05-31 LAB — URINALYSIS, ROUTINE W REFLEX MICROSCOPIC
Glucose, UA: NEGATIVE mg/dL
Ketones, ur: 5 mg/dL — AB
Leukocytes, UA: NEGATIVE
Nitrite: NEGATIVE
PH: 6 (ref 5.0–8.0)
Protein, ur: 30 mg/dL — AB
SPECIFIC GRAVITY, URINE: 1.025 (ref 1.005–1.030)

## 2017-05-31 LAB — I-STAT CG4 LACTIC ACID, ED: Lactic Acid, Venous: 0.8 mmol/L (ref 0.5–1.9)

## 2017-05-31 MED ORDER — METRONIDAZOLE 500 MG PO TABS
500.0000 mg | ORAL_TABLET | Freq: Once | ORAL | Status: AC
  Administered 2017-05-31: 500 mg via ORAL
  Filled 2017-05-31: qty 1

## 2017-05-31 MED ORDER — CIPROFLOXACIN HCL 500 MG PO TABS
500.0000 mg | ORAL_TABLET | Freq: Once | ORAL | Status: AC
  Administered 2017-05-31: 500 mg via ORAL
  Filled 2017-05-31: qty 1

## 2017-05-31 MED ORDER — IOPAMIDOL (ISOVUE-300) INJECTION 61%
INTRAVENOUS | Status: AC
  Filled 2017-05-31: qty 100

## 2017-05-31 MED ORDER — METRONIDAZOLE 500 MG PO TABS: 500.0000 mg | ORAL_TABLET | Freq: Two times a day (BID) | ORAL | 0 refills | Status: DC

## 2017-05-31 MED ORDER — IOPAMIDOL (ISOVUE-300) INJECTION 61%
100.0000 mL | Freq: Once | INTRAVENOUS | Status: AC | PRN
  Administered 2017-05-31: 80 mL via INTRAVENOUS

## 2017-05-31 MED ORDER — SODIUM CHLORIDE 0.9 % IV BOLUS
1000.0000 mL | Freq: Once | INTRAVENOUS | Status: AC
  Administered 2017-05-31: 1000 mL via INTRAVENOUS

## 2017-05-31 MED ORDER — CIPROFLOXACIN HCL 500 MG PO TABS: 500.0000 mg | ORAL_TABLET | Freq: Two times a day (BID) | ORAL | 0 refills | Status: DC

## 2017-05-31 NOTE — Discharge Instructions (Signed)
Return if you develop fever, abdominal pain, vomiting or feeling worse.  Make sure you are eating at and drinking plenty of fluids.  You can start her antibiotics in the morning

## 2017-05-31 NOTE — ED Triage Notes (Signed)
Pt c/o chills and no appetite for 3-4 days. Denies any n/v or urinary problems. Reports some diarrhea but has chron's so dont know when flares up.

## 2017-05-31 NOTE — ED Provider Notes (Signed)
[facility] DEPT Provider Note   CSN: [id number] Arrival date & time: 05/31/17  1101     History   Chief Complaint Chief Complaint  Patient presents with  . Chills  . Anorexia    HPI Brent [name] is a 72 y.o. male.  Patient is a 72 year old male with a history of Crohn's disease on Remicade, chronic kidney disease, depression, anemia, hypertension presenting today with 3-4 days of anorexia, chills and worsening diarrhea.  Patient denies any abdominal pain, shortness of breath, cough.  He has not checked his temperature at home to know if he has had fever.  He denies any urinary symptoms currently but states 2 days ago he had some burning with urination that improved after drinking water.  He has had no nausea or vomiting but states he just cannot force himself to eat.  He denies any recent changes in his medications.  No recent antibiotic use.  The history is provided by the patient.    Past Medical History:  Diagnosis Date  . Abdominal pain, right lower quadrant   . Abdominal pain, unspecified site   . Acute cholecystitis   . Anemia   . Anemia, unspecified   . Anxiety   . Benign paroxysmal positional vertigo   . Bipolar I disorder, most recent episode (or current) unspecified   . Celiac disease   . Cellulitis and abscess of unspecified site   . Chronic kidney disease, stage III (moderate) (HCC)   . Cough   . Crohn's   . Depression   . Diarrhea   . Dizziness and giddiness   . Dizziness and giddiness   . Dizziness and giddiness   . Dysuria   . Edema   . Encounter for long-term (current) use of other medications   . Essential and other specified forms of tremor   . Essential hypertension, benign   . Gout 2018  . Hypertensive renal disease, benign   . Hypertrophy of prostate without urinary obstruction and other lower urinary tract symptoms (LUTS)   . Hypopotassemia   . Impacted cerumen   . Impotence of organic origin   . Insomnia with  sleep apnea, unspecified   . Insomnia with sleep apnea, unspecified   . Iron deficiency anemia, unspecified   . Loss of weight   . Malnutrition of mild degree ([place])   . 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 Nausea with vomiting   . Neuralgia, neuritis, and radiculitis, unspecified   . Other abnormal blood chemistry   . Other B-complex deficiencies   . Other B-complex deficiencies   . Other extrapyramidal disease and abnormal movement disorder   . Other extrapyramidal disease and abnormal movement disorder   . Other malaise and fatigue   . Other specified disease of white blood cells   . Other specified disease of white blood cells   . Pain in joint, lower leg   . Pain in limb   . Postinflammatory pulmonary fibrosis ([place])   . Regional enteritis of small intestine ([place])   . Regional enteritis of small intestine with large intestine ([place])   . Renal insufficiency   . Tobacco use disorder   . Unspecified disorder of skin and subcutaneous tissue   . Vertigo 2018  . Viral warts, unspecified   . Viremia, unspecified     Patient Active Problem List   Diagnosis Date Noted  . History of smoking 30 or more pack years 01/05/2017  . Postinflammatory pulmonary fibrosis ([place]) 08/16/2015  . Bipolar  1 disorder ([place])   . BPH (benign prostatic hyperplasia) 03/31/2013  . 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 Essential hypertension, benign 03/31/2013  . Chronic kidney disease (CKD), stage III (moderate) ([place]) 06/04/2012  . 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[place]) 01/23/2010    Past Surgical History:  Procedure Laterality Date  . CHOLECYSTECTOMY  07-12-2010  . SMALL INTESTINE SURGERY     x 2        Home Medications    Prior to Admission medications   Medication Sig Start Date End Date Taking? Authorizing Provider  allopurinol (ZYLOPRIM) 100 MG tablet Take 2 tablets (200 mg total) by mouth daily. 04/10/16  Yes Lauree Chandler, NP  amLODipine (NORVASC) 2.5 MG tablet Take 2.5 mg by mouth daily.    Yes [provider]  Calcium Carbonate-Vit D-Min 600-400 MG-UNIT TABS Take 1 tablet by mouth 2 (two) times daily.     Yes [provider]  cholecalciferol (VITAMIN D) 1000 units tablet Take 1,000 Units by mouth daily. 2 tablets once a day   Yes [provider]  divalproex (DEPAKOTE) 500 MG EC tablet Take 1,500 mg by mouth daily.    Yes [provider]  epoetin alfa (PROCRIT) 61443 UNIT/ML injection Inject 10,000 Units into the skin every 30 (thirty) days.    Yes [provider]  ferrous sulfate 325 (65 FE) MG tablet Take 325 mg by mouth 2 (two) times daily.     Yes [provider]  inFLIXimab (REMICADE) 100 MG injection Infuse Remicade IV schedule 1 68m/kg every 8 weeks Premedicate with Tylenol 500-6590mby mouth and Benadryl 25-5019my mouth prior to infusion. Last PPD was on 12/2009.  11/22/10  Yes JacMilus BanisterD  Loperamide HCl (LOPERAMIDE A-D PO) Take 1 capsule by mouth as needed (loose stools).    Yes [provider]  magnesium oxide (MAG-OX) 400 (241.3 Mg) MG tablet Take 800 mg by mouth 3 (three) times daily. 04/17/17  Yes [provider]  meclizine (ANTIVERT) 25 MG tablet Take 25 mg by mouth 3 (three) times daily as needed.  06/21/16  Yes [provider]  metoprolol succinate (TOPROL-XL) 50 MG 24 hr tablet Take 50 mg by mouth daily. Take with or immediately following a meal.   Yes [provider]  Multiple Vitamin (MULTIVITAMIN) tablet Take 1 tablet by mouth daily.     Yes [provider]  OLANZapine (ZYPREXA) 7.5 MG tablet Take 1 tablet (7.5 mg total) by mouth at bedtime. 01/03/16  Yes Reed, Tiffany L, DO  omeprazole (PRILOSEC) 20 MG capsule Take 20 mg by mouth daily before supper.     Yes [provider]  oxybutynin (DITROPAN XL) 15 MG 24 hr tablet Take 15 mg by mouth daily.     Yes [provider]  prazosin (MINIPRESS) 5 MG capsule Take 5 mg by mouth at bedtime.   Yes  [provider]  vitamin E 400 UNIT capsule Take 400 Units by mouth daily.     Yes [provider]    Family History Family History  Problem Relation Age of Onset  . Diabetes Mother        maternal grandmother  . Uterine cancer Mother   . Emphysema Father   . Pneumonia Maternal Grandmother   . Colon cancer Neg Hx     Social History Social History   Tobacco Use  . Smoking status: Former Smoker    Packs/day: 1.00    Years: 49.00  Pack years: 49.00    Types: Cigarettes    Start date: 04/11/1956    Last attempt to quit: 04/17/2014    Years since quitting: 3.1  . Smokeless tobacco: Never Used  Substance Use Topics  . Alcohol use: No    Alcohol/week: 0.0 oz  . Drug use: No     Allergies   Azathioprine; Ciprofloxacin; Levaquin [levofloxacin in d5w]; and Plendil [felodipine]   Review of Systems Review of Systems  All other systems reviewed and are negative.    Physical Exam Updated Vital Signs BP 128/76   Pulse 74   Temp (!) 97.5 F (36.4 C) (Oral)   Resp 20   Ht 6' 4"  (1.93 m)   Wt 79.4 kg (175 lb)   SpO2 97%   BMI 21.30 kg/m   Physical Exam  Constitutional: He is oriented to person, place, and time. He appears well-developed and well-nourished. No distress.  HENT:  Head: Normocephalic and atraumatic.  Mouth/Throat: Oropharynx is clear and moist. Mucous membranes are dry.  Eyes: Pupils are equal, round, and reactive to light. Conjunctivae and EOM are normal.  Neck: Normal range of motion. Neck supple.  Cardiovascular: Normal rate, regular rhythm and intact distal pulses.  No murmur heard. Pulmonary/Chest: Effort normal and breath sounds normal. No respiratory distress. He has no wheezes. He has no rales.  Abdominal: Soft. He exhibits no distension. There is tenderness in the right lower quadrant. There is no rebound and no guarding.  Musculoskeletal: Normal range of motion. He exhibits no edema or tenderness.  Neurological: He is alert  and oriented to person, place, and time.  Skin: Skin is warm and dry. No rash noted. No erythema.  Psychiatric: He has a normal mood and affect. His behavior is normal.  Nursing note and vitals reviewed.    ED Treatments / Results  Labs (all labs ordered are listed, but only abnormal results are displayed) Labs Reviewed  CBC WITH DIFFERENTIAL/PLATELET - Abnormal; Notable for the following components:      Result Value   RBC 3.63 (*)    Hemoglobin 11.7 (*)    HCT 37.6 (*)    MCV 103.6 (*)    All other components within normal limits  COMPREHENSIVE METABOLIC PANEL - Abnormal; Notable for the following components:   Creatinine, Ser 1.77 (*)    Albumin 3.2 (*)    AST 13 (*)    ALT 7 (*)    GFR calc non Af Amer 37 (*)    GFR calc Af Amer 42 (*)    All other components within normal limits  URINALYSIS, ROUTINE W REFLEX MICROSCOPIC - Abnormal; Notable for the following components:   APPearance HAZY (*)    Hgb urine dipstick SMALL (*)    Bilirubin Urine SMALL (*)    Ketones, ur 5 (*)    Protein, ur 30 (*)    All other components within normal limits  URINALYSIS, MICROSCOPIC (REFLEX) - Abnormal; Notable for the following components:   Bacteria, UA RARE (*)    Squamous Epithelial / LPF 0-5 (*)    Crystals BILIRUBIN CRYSTALS PRESENT (*)    All other components within normal limits  URINE CULTURE  I-STAT CG4 LACTIC ACID, ED    EKG None  Radiology Dg Chest 2 View  Result Date: 05/31/2017 CLINICAL DATA:  72 year old male with chills and decreased appetite EXAM: CHEST - 2 VIEW COMPARISON:  Prior CT chest 01/20/2017 FINDINGS: The lungs are clear and negative for focal airspace consolidation, pulmonary  edema or suspicious pulmonary nodule. No pleural effusion or pneumothorax. Cardiac and mediastinal contours are within normal limits. No acute fracture or lytic or blastic osseous lesions. The visualized upper abdominal bowel gas pattern is unremarkable. IMPRESSION: Negative chest x-ray.  Electronically Signed   By: Jacqulynn Cadet M.D.   On: 05/31/2017 12:28   Ct Abdomen Pelvis W Contrast  Result Date: 05/31/2017 CLINICAL DATA:  Pt c/o chills and no appetite for 3-4 days. Denies any n/v or urinary problems. Reports some diarrhea but has chron's so dont know when flares up EXAM: CT ABDOMEN AND PELVIS WITH CONTRAST TECHNIQUE: Multidetector CT imaging of the abdomen and pelvis was performed using the standard protocol following bolus administration of intravenous contrast. CONTRAST:  62m ISOVUE-300 IOPAMIDOL (ISOVUE-300) INJECTION 61% COMPARISON:  05/03/2015 FINDINGS: Lower chest: Mild dependent subsegmental atelectasis. Lung bases otherwise clear. There is a 9 mm short axis lymph node along the right margin of the distal esophagus, increased in size from prior CT. Heart is normal in size. Hepatobiliary: No liver mass or focal lesion. Gallbladder surgically absent. There is intra and extrahepatic bile duct dilation, common bile duct measuring 12 mm. There is distal tapering. This is stable from the prior CT. Pancreas: No mass or inflammation. Mild dilation pancreatic duct, stable. Spleen: Normal in size without focal abnormality. Adrenals/Urinary Tract: Stable of a mm left adrenal nodule. Normal right adrenal gland. Bilateral renal cortical thinning. There are multiple bilateral low-density renal masses consistent with cysts. These are essentially stable from the prior CT. No renal stones. No hydronephrosis. Ureters are normal course and caliber. Bladder is only mildly distended. Wall appears mildly thickened. No mass or stone. Stomach/Bowel: There is mild wall thickening along the left colon without adjacent inflammation. Right partial hemicolectomy has been performed with formation of an ileocolic anastomosis. No small bowel wall thickening or adjacent inflammation. No evidence of bowel obstruction. Stomach is unremarkable. Vascular/Lymphatic: Mild aortic atherosclerosis. No pathologically  enlarged lymph nodes. Reproductive: Mild enlargement of the prostate. Other: No abdominal wall hernia or abnormality. No abdominopelvic ascites. Musculoskeletal: No fracture or acute finding. No osteoblastic or osteolytic lesions. IMPRESSION: 1. Mild wall thickening of the left colon. There is no associated inflammation. This could reflect a mild colitis. 2. Bladder wall appears mildly thickened. Consider cystitis in proper clinical setting. 3. No other evidence of an acute abnormality. 4. Stable changes from previous bowel surgery. Stable changes from a cholecystectomy with chronic dilation of common bile duct. 5. Multiple bilateral renal cysts. Electronically Signed   By: DLajean ManesM.D.   On: 05/31/2017 15:14    Procedures Procedures (including critical care time)  Medications Ordered in ED Medications  iopamidol (ISOVUE-300) 61 % injection 100 mL (has no administration in time range)  sodium chloride 0.9 % bolus 1,000 mL (0 mLs Intravenous Stopped 05/31/17 1330)     Initial Impression / Assessment and Plan / ED Course  I have reviewed the triage vital signs and the nursing notes.  Pertinent labs & imaging results that were available during my care of the patient were reviewed by me and considered in my medical decision making (see chart for details).     Elderly male who is immunosuppressed with Remicade with a history of Crohn's disease presenting today with 3-4 days of anorexia, chills and increasing diarrhea.  Patient denies any abdominal pain but however on exam does have some right lower quadrant pain without rebound or guarding.  Low suspicion that patient's symptoms are related to perforation, respiratory or  cardiac cause.  Concern for possible Crohn's flare versus UTI as patient did have some dysuria a few days ago.  He does appear to be dehydrated but is otherwise in no acute distress. Lactic acid within normal limits, CBC unchanged, CMP with persistent CKD but unchanged.  UA wnl.   CXR wnl.  CT of abd pelvis pending.  3:31 PM CT of the abdomen pelvis shows mild wall thickening of the left colon which might reflect colitis but no evidence of other acute abnormalities.  There is some mild bladder wall thickening however UA does not appear infected.  A culture will be done and patient is not currently complaining of urinary symptoms at this time.  Will start on Cipro and Flagyl.  Initially patient's allergy list states that he is allergic to Cipro however he denies this and states he does not know why that is there.  Feel that patient is safe for discharge home but given strict return precautions if he starts developing worsening anorexia, fever, vomiting or development of the abdominal pain.  Final Clinical Impressions(s) / ED Diagnoses   Final diagnoses:  Colitis    ED Discharge Orders        Ordered    ciprofloxacin (CIPRO) 500 MG tablet  2 times daily     05/31/17 1532    metroNIDAZOLE (FLAGYL) 500 MG tablet  2 times daily     05/31/17 1532       Blanchie Dessert, MD 05/31/17 1533

## 2017-06-08 DIAGNOSIS — Z125 Encounter for screening for malignant neoplasm of prostate: Secondary | ICD-10-CM | POA: Diagnosis not present

## 2017-06-10 DIAGNOSIS — M19071 Primary osteoarthritis, right ankle and foot: Secondary | ICD-10-CM | POA: Diagnosis not present

## 2017-06-10 DIAGNOSIS — K5090 Crohn's disease, unspecified, without complications: Secondary | ICD-10-CM | POA: Diagnosis not present

## 2017-06-10 DIAGNOSIS — M109 Gout, unspecified: Secondary | ICD-10-CM | POA: Diagnosis not present

## 2017-06-10 DIAGNOSIS — M19072 Primary osteoarthritis, left ankle and foot: Secondary | ICD-10-CM | POA: Diagnosis not present

## 2017-06-10 DIAGNOSIS — M79673 Pain in unspecified foot: Secondary | ICD-10-CM | POA: Diagnosis not present

## 2017-06-10 DIAGNOSIS — N189 Chronic kidney disease, unspecified: Secondary | ICD-10-CM | POA: Diagnosis not present

## 2017-06-11 LAB — HEPATIC FUNCTION PANEL
ALT: 6 — AB (ref 10–40)
AST: 9 — AB (ref 14–40)
Alkaline Phosphatase: 55 (ref 25–125)
Bilirubin, Total: 0.2

## 2017-06-11 LAB — CBC AND DIFFERENTIAL
HCT: 33 — AB (ref 41–53)
Hemoglobin: 10.6 — AB (ref 13.5–17.5)
Platelets: 171 (ref 150–399)
WBC: 8.3

## 2017-06-11 LAB — BASIC METABOLIC PANEL
BUN: 18 (ref 4–21)
CREATININE: 1.8 — AB (ref 0.6–1.3)
Glucose: 89
POTASSIUM: 3.5 (ref 3.4–5.3)
Sodium: 139 (ref 137–147)

## 2017-06-15 ENCOUNTER — Encounter: Payer: Self-pay | Admitting: Internal Medicine

## 2017-06-15 DIAGNOSIS — N400 Enlarged prostate without lower urinary tract symptoms: Secondary | ICD-10-CM | POA: Diagnosis not present

## 2017-06-15 DIAGNOSIS — R350 Frequency of micturition: Secondary | ICD-10-CM | POA: Diagnosis not present

## 2017-06-16 ENCOUNTER — Encounter: Payer: Self-pay | Admitting: Nurse Practitioner

## 2017-06-16 ENCOUNTER — Ambulatory Visit (INDEPENDENT_AMBULATORY_CARE_PROVIDER_SITE_OTHER): Payer: Medicare Other | Admitting: Nurse Practitioner

## 2017-06-16 VITALS — BP 124/68 | HR 92 | Temp 98.9°F | Ht 76.0 in | Wt 172.0 lb

## 2017-06-16 DIAGNOSIS — K529 Noninfective gastroenteritis and colitis, unspecified: Secondary | ICD-10-CM

## 2017-06-16 NOTE — Progress Notes (Signed)
Careteam: Patient Care Team: Gayland Curry, DO as PCP - General (Geriatric Medicine) Elmarie Shiley, MD (Nephrology) Milus Banister, MD (Gastroenterology)  Advanced Directive information    Allergies  Allergen Reactions  . Azathioprine     REACTION: affected WBC  . Ciprofloxacin     Pt denies any reaction  . Levaquin [Levofloxacin In D5w]     Pt denies any reaction  . Plendil [Felodipine]     Pt denies any reaction    Chief Complaint  Patient presents with  . Follow-up    Pt is being seen for an ED follow up visit. Pt was seen at [facility] on 05/31/17 due to colitis.      HPI: Patient is a 72 y.o. male seen in the office today to follow up colitis. history of Crohn's disease on Remicade, chronic kidney disease, depression, anemia, hypertension. Pt went to ED on 05/31/17 due to anorexia, chills and diarrhea.  He was diagnosised with colitis and started on cipro and flagyl for 7 days. Has completed treatment of this. No ongoing abdominal pain or diarrhea. Reports appetite is still poor. Can go all day without wanting to eat but reports he does eat. No abdominal pain or nausea when he eats. Reports he has increased his fluids.  No dysuria and urine is clear. Has been checking because was concentrated when he went to the ED. Overall feels much better.  Reports he has a endoscopy and colonoscopy planned in July    Review of Systems:  Review of Systems  Constitutional: Negative for chills, fever and weight loss.       Poor energy but coming back   Respiratory: Negative for cough, sputum production and shortness of breath.   Cardiovascular: Negative for chest pain and leg swelling.  Gastrointestinal: Negative for abdominal pain, constipation, diarrhea, heartburn, nausea and vomiting.  Genitourinary: Negative for dysuria, frequency and urgency.  Neurological: Positive for dizziness (ongoing, chronic without increase in symptoms). Negative for weakness and headaches.    Past Medical  History:  Diagnosis Date  . Abdominal pain, right lower quadrant   . Abdominal pain, unspecified site   . Acute cholecystitis   . Anemia   . Anemia, unspecified   . Anxiety   . Benign paroxysmal positional vertigo   . Bipolar I disorder, most recent episode (or current) unspecified   . Celiac disease   . Cellulitis and abscess of unspecified site   . Chronic kidney disease, stage III (moderate) (HCC)   . Cough   . Crohn's   . Depression   . Diarrhea   . Dizziness and giddiness   . Dizziness and giddiness   . Dizziness and giddiness   . Dysuria   . Edema   . Encounter for long-term (current) use of other medications   . Essential and other specified forms of tremor   . Essential hypertension, benign   . Gout 2018  . Hypertensive renal disease, benign   . Hypertrophy of prostate without urinary obstruction and other lower urinary tract symptoms (LUTS)   . Hypopotassemia   . Impacted cerumen   . Impotence of organic origin   . Insomnia with sleep apnea, unspecified   . Insomnia with sleep apnea, unspecified   . Iron deficiency anemia, unspecified   . Loss of weight   . Malnutrition of mild degree ([place])   . 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 Nausea with vomiting   . Neuralgia, neuritis, and radiculitis, unspecified   . Other abnormal  blood chemistry   . Other B-complex deficiencies   . Other B-complex deficiencies   . Other extrapyramidal disease and abnormal movement disorder   . Other extrapyramidal disease and abnormal movement disorder   . Other malaise and fatigue   . Other specified disease of white blood cells   . Other specified disease of white blood cells   . Pain in joint, lower leg   . Pain in limb   . Postinflammatory pulmonary fibrosis ([place])   . Regional enteritis of small intestine ([place])   . Regional enteritis of small intestine with large intestine ([place])   . Renal insufficiency   . Tobacco use disorder   . Unspecified disorder of skin and subcutaneous tissue     . Vertigo 2018  . Viral warts, unspecified   . Viremia, unspecified    Past Surgical History:  Procedure Laterality Date  . CHOLECYSTECTOMY  07-12-2010  . SMALL INTESTINE SURGERY     x 2   Social History:   reports that he quit smoking about 3 years ago. His smoking use included cigarettes. He started smoking about 61 years ago. He has a 49.00 pack-year smoking history. He has never used smokeless tobacco. He reports that he does not drink alcohol or use drugs.  Family History  Problem Relation Age of Onset  . Diabetes Mother        maternal grandmother  . Uterine cancer Mother   . Emphysema Father   . Pneumonia Maternal Grandmother   . Colon cancer Neg Hx     Medications: Patient's Medications  New Prescriptions   No medications on file  Previous Medications   ALLOPURINOL (ZYLOPRIM) 100 MG TABLET    Take 2 tablets (200 mg total) by mouth daily.   AMLODIPINE (NORVASC) 2.5 MG TABLET    Take 2.5 mg by mouth daily.   CALCIUM CARBONATE-VIT D-MIN 600-400 MG-UNIT TABS    Take 1 tablet by mouth 3 (three) times daily.    CHOLECALCIFEROL (VITAMIN D) 1000 UNITS TABLET    2 tablets once a day    DIVALPROEX (DEPAKOTE) 500 MG EC TABLET    Take 1,500 mg by mouth daily.    EPOETIN ALFA (PROCRIT) 43735 UNIT/ML INJECTION    Inject 10,000 Units into the skin every 30 (thirty) days.    FERROUS SULFATE 325 (65 FE) MG TABLET    Take 325 mg by mouth 2 (two) times daily.     INFLIXIMAB (REMICADE) 100 MG INJECTION    Infuse Remicade IV schedule 1 79m/kg every 8 weeks Premedicate with Tylenol 500-6577mby mouth and Benadryl 25-5048my mouth prior to infusion. Last PPD was on 12/2009.    LOPERAMIDE HCL (LOPERAMIDE A-D PO)    Take 1 capsule by mouth as needed (loose stools).    MAGNESIUM OXIDE (MAG-OX) 400 (241.3 MG) MG TABLET    Take 800 mg by mouth 3 (three) times daily.   MECLIZINE (ANTIVERT) 25 MG TABLET    Take 25 mg by mouth 3 (three) times daily as needed.    METOPROLOL SUCCINATE (TOPROL-XL)  50 MG 24 HR TABLET    Take 50 mg by mouth daily. Take with or immediately following a meal.   MULTIPLE VITAMIN (MULTIVITAMIN) TABLET    Take 1 tablet by mouth daily.     OLANZAPINE (ZYPREXA) 7.5 MG TABLET    Take 1 tablet (7.5 mg total) by mouth at bedtime.   OMEPRAZOLE (PRILOSEC) 20 MG CAPSULE    Take 20 mg by mouth  daily before supper.     OXYBUTYNIN (DITROPAN XL) 15 MG 24 HR TABLET    Take 15 mg by mouth daily.     PRAZOSIN (MINIPRESS) 5 MG CAPSULE    Take 5 mg by mouth at bedtime.   VITAMIN E 400 UNIT CAPSULE    Take 400 Units by mouth daily.    Modified Medications   No medications on file  Discontinued Medications   CIPROFLOXACIN (CIPRO) 500 MG TABLET    Take 1 tablet (500 mg total) by mouth 2 (two) times daily.   METRONIDAZOLE (FLAGYL) 500 MG TABLET    Take 1 tablet (500 mg total) by mouth 2 (two) times daily.     Physical Exam:  Vitals:   06/16/17 1419  BP: 124/68  Pulse: 92  Temp: 98.9 F (37.2 C)  TempSrc: Oral  SpO2: 95%  Weight: 172 lb (78 kg)  Height: 6' 4"  (1.93 m)   Body mass index is 20.94 kg/m.  Physical Exam  Constitutional: He is oriented to person, place, and time. He appears well-developed and well-nourished.  HENT:  Head: Normocephalic.  Cardiovascular: Normal rate, regular rhythm and normal heart sounds.  Pulmonary/Chest: Effort normal and breath sounds normal.  Abdominal: Soft. Bowel sounds are normal. He exhibits no distension. There is no tenderness. There is no guarding.  Musculoskeletal: Normal range of motion.  Neurological: He is alert and oriented to person, place, and time.  Skin: Skin is warm and dry.    Labs reviewed: Basic Metabolic Panel: Recent Labs    05/04/17 0903 05/31/17 1203  NA 141 140  K 4.5 4.1  CL 106 108  CO2 28 24  GLUCOSE 116* 83  BUN 24 18  CREATININE 2.00* 1.77*  CALCIUM 9.1 9.0   Liver Function Tests: Recent Labs    05/04/17 0903 05/31/17 1203  AST 10 13*  ALT 5* 7*  ALKPHOS  --  53  BILITOT 0.3 0.5   PROT 6.9 7.1  ALBUMIN  --  3.2*   No results for input(s): LIPASE, AMYLASE in the last 8760 hours. No results for input(s): AMMONIA in the last 8760 hours. CBC: Recent Labs    05/04/17 0903 05/25/17 0901 05/31/17 1203  WBC 8.6  --  9.5  NEUTROABS 4,773  --  6.2  HGB 11.3* 11.2* 11.7*  HCT 34.3*  --  37.6*  MCV 98.6  --  103.6*  PLT 180  --  190   Lipid Panel: Recent Labs    05/04/17 0903  CHOL 138  HDL 42  LDLCALC 71  TRIG 176*  CHOLHDL 3.3   TSH: No results for input(s): TSH in the last 8760 hours. A1C: Lab Results  Component Value Date   HGBA1C 4.9 08/10/2015     Assessment/Plan 1. Colitis Improved, doing much better however overall energy is slow to return; continue to keep hydration and increase oral intake as tolerated.  To notify if symptoms worsen.   Next appt: 09/17/2017 as scheduled, sooner if needed  Janett Billow K. [place], [facility] [phone]

## 2017-06-16 NOTE — Patient Instructions (Signed)
Cont to stay hydrated and make prior diet

## 2017-06-18 ENCOUNTER — Encounter (HOSPITAL_COMMUNITY): Payer: Self-pay | Admitting: Emergency Medicine

## 2017-06-18 ENCOUNTER — Emergency Department (HOSPITAL_COMMUNITY)
Admission: EM | Admit: 2017-06-18 | Discharge: 2017-06-18 | Disposition: A | Discharge status: Home / Self Care | Payer: Medicare Other | Attending: Emergency Medicine | Admitting: Emergency Medicine

## 2017-06-18 DIAGNOSIS — Z87891 Personal history of nicotine dependence: Secondary | ICD-10-CM | POA: Insufficient documentation

## 2017-06-18 DIAGNOSIS — R630 Anorexia: Secondary | ICD-10-CM | POA: Diagnosis not present

## 2017-06-18 DIAGNOSIS — R6883 Chills (without fever): Secondary | ICD-10-CM | POA: Diagnosis present

## 2017-06-18 DIAGNOSIS — I129 Hypertensive chronic kidney disease with stage 1 through stage 4 chronic kidney disease, or unspecified chronic kidney disease: Secondary | ICD-10-CM | POA: Diagnosis not present

## 2017-06-18 DIAGNOSIS — F319 Bipolar disorder, unspecified: Secondary | ICD-10-CM | POA: Diagnosis not present

## 2017-06-18 DIAGNOSIS — Z79899 Other long term (current) drug therapy: Secondary | ICD-10-CM | POA: Insufficient documentation

## 2017-06-18 DIAGNOSIS — N183 Chronic kidney disease, stage 3 (moderate): Secondary | ICD-10-CM | POA: Insufficient documentation

## 2017-06-18 DIAGNOSIS — R197 Diarrhea, unspecified: Secondary | ICD-10-CM | POA: Insufficient documentation

## 2017-06-18 LAB — COMPREHENSIVE METABOLIC PANEL
ALT: 8 U/L — AB (ref 17–63)
AST: 14 U/L — ABNORMAL LOW (ref 15–41)
Albumin: 3 g/dL — ABNORMAL LOW (ref 3.5–5.0)
Alkaline Phosphatase: 46 U/L (ref 38–126)
Anion gap: 7 (ref 5–15)
BUN: 14 mg/dL (ref 6–20)
CO2: 23 mmol/L (ref 22–32)
CREATININE: 1.69 mg/dL — AB (ref 0.61–1.24)
Calcium: 8.5 mg/dL — ABNORMAL LOW (ref 8.9–10.3)
Chloride: 111 mmol/L (ref 101–111)
GFR calc non Af Amer: 39 mL/min — ABNORMAL LOW (ref >60)
GFR, EST AFRICAN AMERICAN: 45 mL/min — AB (ref >60)
Glucose, Bld: 103 mg/dL — ABNORMAL HIGH (ref 65–99)
Potassium: 3.5 mmol/L (ref 3.5–5.1)
SODIUM: 141 mmol/L (ref 135–145)
Total Bilirubin: 0.5 mg/dL (ref 0.3–1.2)
Total Protein: 6.9 g/dL (ref 6.5–8.1)

## 2017-06-18 LAB — URINALYSIS, ROUTINE W REFLEX MICROSCOPIC
BILIRUBIN URINE: NEGATIVE
Bacteria, UA: NONE SEEN
Glucose, UA: NEGATIVE mg/dL
Hgb urine dipstick: NEGATIVE
KETONES UR: NEGATIVE mg/dL
LEUKOCYTES UA: NEGATIVE
Nitrite: NEGATIVE
PROTEIN: 30 mg/dL — AB
Specific Gravity, Urine: 1.023 (ref 1.005–1.030)
pH: 6 (ref 5.0–8.0)

## 2017-06-18 LAB — CBC
HCT: 31.7 % — ABNORMAL LOW (ref 39.0–52.0)
HEMOGLOBIN: 10 g/dL — AB (ref 13.0–17.0)
MCH: 32.4 pg (ref 26.0–34.0)
MCHC: 31.5 g/dL (ref 30.0–36.0)
MCV: 102.6 fL — ABNORMAL HIGH (ref 78.0–100.0)
PLATELETS: 187 10*3/uL (ref 150–400)
RBC: 3.09 MIL/uL — AB (ref 4.22–5.81)
RDW: 14.6 % (ref 11.5–15.5)
WBC: 8.8 10*3/uL (ref 4.0–10.5)

## 2017-06-18 LAB — LIPASE, BLOOD: LIPASE: 31 U/L (ref 11–51)

## 2017-06-18 NOTE — ED Triage Notes (Signed)
Pt c/o chills, diarrhea, and no appetite that he was continued since he was seen here couple weeks ago. Pt was diagnosed with colitis and given antibiotics.

## 2017-06-18 NOTE — Discharge Instructions (Signed)
Return to the ED with any concerns including vomiting and not able to keep down liquids, abdominal pain, watery stools, fever/chills, fainting, decreased level of alertness/lethargy, or any other alarming symptoms

## 2017-06-18 NOTE — ED Provider Notes (Signed)
[facility] DEPT Provider Note   CSN: [id number] Arrival date & time: 06/18/17  1200     History   Chief Complaint Chief Complaint  Patient presents with  . Chills  . no appetite  . Diarrhea    HPI Brent [name] is a 72 y.o. male.  HPI  Patient with history of chronic kidney disease, Crohn's diarrhea presents with ongoing lack of appetite after treatment for a colitis several weeks ago.  He was seen in the ED and CT scan showed mild colitis, he was treated with antibiotics.  He states at that time he was having watery diarrhea.  Over the past 3 weeks he has continued to have loose stools but states they are soft and no longer watery.  He denies abdominal pain.  He continues to have decreased appetite.  He states he ate country ham and it did not agree with the stomach.  He has had no vomiting.  He states he has been drinking liquids but became concerned that his kidney function might be worse because he was not drinking enough.  Had no fever.  He states he has been generally fatigued.  He followed up with his primary doctor and they told him to continue fluids at home.  There are no other associated systemic symptoms, there are no other alleviating or modifying factors.   Past Medical History:  Diagnosis Date  . Abdominal pain, right lower quadrant   . Abdominal pain, unspecified site   . Acute cholecystitis   . Anemia   . Anemia, unspecified   . Anxiety   . Benign paroxysmal positional vertigo   . Bipolar I disorder, most recent episode (or current) unspecified   . Celiac disease   . Cellulitis and abscess of unspecified site   . Chronic kidney disease, stage III (moderate) (HCC)   . Cough   . Crohn's   . Depression   . Diarrhea   . Dizziness and giddiness   . Dizziness and giddiness   . Dizziness and giddiness   . Dysuria   . Edema   . Encounter for long-term (current) use of other medications   . Essential and other specified forms of  tremor   . Essential hypertension, benign   . Gout 2018  . Hypertensive renal disease, benign   . Hypertrophy of prostate without urinary obstruction and other lower urinary tract symptoms (LUTS)   . Hypopotassemia   . Impacted cerumen   . Impotence of organic origin   . Insomnia with sleep apnea, unspecified   . Insomnia with sleep apnea, unspecified   . Iron deficiency anemia, unspecified   . Loss of weight   . Malnutrition of mild degree ([place])   . 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 Nausea with vomiting   . Neuralgia, neuritis, and radiculitis, unspecified   . Other abnormal blood chemistry   . Other B-complex deficiencies   . Other B-complex deficiencies   . Other extrapyramidal disease and abnormal movement disorder   . Other extrapyramidal disease and abnormal movement disorder   . Other malaise and fatigue   . Other specified disease of white blood cells   . Other specified disease of white blood cells   . Pain in joint, lower leg   . Pain in limb   . Postinflammatory pulmonary fibrosis ([place])   . Regional enteritis of small intestine ([place])   . Regional enteritis of small intestine with large intestine ([place])   . Renal insufficiency   .  Tobacco use disorder   . Unspecified disorder of skin and subcutaneous tissue   . Vertigo 2018  . Viral warts, unspecified   . Viremia, unspecified     Patient Active Problem List   Diagnosis Date Noted  . History of smoking 30 or more pack years 01/05/2017  . Postinflammatory pulmonary fibrosis ([place]) 08/16/2015  . Bipolar 1 disorder ([place])   . BPH (benign prostatic hyperplasia) 03/31/2013  . 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 Essential hypertension, benign 03/31/2013  . Chronic kidney disease (CKD), stage III (moderate) ([place]) 06/04/2012  . 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[place]) 01/23/2010    Past Surgical History:  Procedure Laterality Date  . CHOLECYSTECTOMY  07-12-2010  . SMALL INTESTINE SURGERY     x 2        Home  Medications    Prior to Admission medications   Medication Sig Start Date End Date Taking? Authorizing Provider  allopurinol (ZYLOPRIM) 100 MG tablet Take 2 tablets (200 mg total) by mouth daily. 04/10/16  Yes Lauree Chandler, NP  amLODipine (NORVASC) 2.5 MG tablet Take 2.5 mg by mouth daily.    [provider]  Calcium Carbonate-Vit D-Min 600-400 MG-UNIT TABS Take 1 tablet by mouth 3 (three) times daily.     [provider]  cholecalciferol (VITAMIN D) 1000 units tablet 2 tablets once a day     [provider]  divalproex (DEPAKOTE) 500 MG EC tablet Take 1,500 mg by mouth daily.     [provider]  epoetin alfa (PROCRIT) 29476 UNIT/ML injection Inject 10,000 Units into the skin every 30 (thirty) days.     [provider]  ferrous sulfate 325 (65 FE) MG tablet Take 325 mg by mouth 2 (two) times daily.      [provider]  inFLIXimab (REMICADE) 100 MG injection Infuse Remicade IV schedule 1 86m/kg every 8 weeks Premedicate with Tylenol 500-6532mby mouth and Benadryl 25-502my mouth prior to infusion. Last PPD was on 12/2009.  11/22/10   JacMilus BanisterD  Loperamide HCl (LOPERAMIDE A-D PO) Take 1 capsule by mouth as needed (loose stools).     [provider]  magnesium oxide (MAG-OX) 400 (241.3 Mg) MG tablet Take 800 mg by mouth 3 (three) times daily. 04/17/17   [provider]  meclizine (ANTIVERT) 25 MG tablet Take 25 mg by mouth 3 (three) times daily as needed.  06/21/16   [provider]  metoprolol succinate (TOPROL-XL) 50 MG 24 hr tablet Take 50 mg by mouth daily. Take with or immediately following a meal.    [provider]  Multiple Vitamin (MULTIVITAMIN) tablet Take 1 tablet by mouth daily.      [provider]  OLANZapine (ZYPREXA) 7.5 MG tablet Take 1 tablet (7.5 mg total) by mouth at bedtime. 01/03/16   Reed, Tiffany L, DO  omeprazole (PRILOSEC) 20 MG capsule Take 20 mg by mouth  daily before supper.      [provider]  oxybutynin (DITROPAN XL) 15 MG 24 hr tablet Take 15 mg by mouth daily.      [provider]  prazosin (MINIPRESS) 5 MG capsule Take 5 mg by mouth at bedtime.    [provider]  vitamin E 400 UNIT capsule Take 400 Units by mouth daily.      [provider]    Family History Family History  Problem Relation Age of Onset  . Diabetes Mother  maternal grandmother  . Uterine cancer Mother   . Emphysema Father   . Pneumonia Maternal Grandmother   . Colon cancer Neg Hx     Social History Social History   Tobacco Use  . Smoking status: Former Smoker    Packs/day: 1.00    Years: 49.00    Pack years: 49.00    Types: Cigarettes    Start date: 04/11/1956    Last attempt to quit: 04/17/2014    Years since quitting: 3.1  . Smokeless tobacco: Never Used  Substance Use Topics  . Alcohol use: No    Alcohol/week: 0.0 oz  . Drug use: No     Allergies   Azathioprine; Ciprofloxacin; Levaquin [levofloxacin in d5w]; and Plendil [felodipine]   Review of Systems Review of Systems  ROS reviewed and all otherwise negative except for mentioned in HPI   Physical Exam Updated Vital Signs BP (!) 147/96 (BP Location: Right Arm)   Pulse 89   Temp 98.5 F (36.9 C) (Oral)   Resp 18   Ht 6' 4"  (1.93 m)   Wt 78.9 kg (174 lb)   SpO2 97%   BMI 21.18 kg/m   vitals reviewed Physical Exam  Physical Examination: General appearance - alert, well appearing, and in no distress Mental status - alert, oriented to person, place, and time Eyes - no conjunctival injection, no scleral icterus Mouth - mucous membranes moist, pharynx normal without lesions Neck - supple, no significant adenopathy Chest - clear to auscultation, no wheezes, rales or rhonchi, symmetric air entry Heart - normal rate, regular rhythm, normal S1, S2, no murmurs, rubs, clicks or gallops Abdomen - soft, nontender, nondistended, no masses or  organomegaly Neurological - alert, oriented, normal speech Extremities - peripheral pulses normal, no pedal edema, no clubbing or cyanosis Skin - normal coloration and turgor, no rashes   ED Treatments / Results  Labs (all labs ordered are listed, but only abnormal results are displayed) Labs Reviewed  COMPREHENSIVE METABOLIC PANEL - Abnormal; Notable for the following components:      Result Value   Glucose, Bld 103 (*)    Creatinine, Ser 1.69 (*)    Calcium 8.5 (*)    Albumin 3.0 (*)    AST 14 (*)    ALT 8 (*)    GFR calc non Af Amer 39 (*)    GFR calc Af Amer 45 (*)    All other components within normal limits  CBC - Abnormal; Notable for the following components:   RBC 3.09 (*)    Hemoglobin 10.0 (*)    HCT 31.7 (*)    MCV 102.6 (*)    All other components within normal limits  URINALYSIS, ROUTINE W REFLEX MICROSCOPIC - Abnormal; Notable for the following components:   Protein, ur 30 (*)    All other components within normal limits  LIPASE, BLOOD    EKG None  Radiology No results found.  Procedures Procedures (including critical care time)  Medications Ordered in ED Medications - No data to display   Initial Impression / Assessment and Plan / ED Course  I have reviewed the triage vital signs and the nursing notes.  Pertinent labs & imaging results that were available during my care of the patient were reviewed by me and considered in my medical decision making (see chart for details).    Patient presenting with complaint of ongoing decreased appetite and soft stools since treatment for colitis several weeks ago.  He has no abdominal pain.  He has no vomiting.  They cannot send stool for culture or C. difficile as stools are soft but not watery.  Therefore doubt C. difficile infection.  Patient has questions about what types of food to eat and how to best hydrate at home.  He has no overt signs or symptoms of dehydration in the ED.  Labs are reassuring.   Discussed using clear liquids, Ensure or boost and bland foods.  He is followed up with primary care doctor and I advised him to keep in touch and follow-up with primary care doctor again if his lack of appetite does not improve.  His renal function has improved compared to last ED visit- which was his primary concern. Discharged with strict return precautions.  Pt agreeable with plan.  Final Clinical Impressions(s) / ED Diagnoses   Final diagnoses:  Anorexia    ED Discharge Orders    None       Duante Arocho, Forbes Cellar, MD 06/19/17 0000

## 2017-06-22 ENCOUNTER — Encounter: Payer: Self-pay | Admitting: Nurse Practitioner

## 2017-06-22 ENCOUNTER — Ambulatory Visit (INDEPENDENT_AMBULATORY_CARE_PROVIDER_SITE_OTHER): Payer: Medicare Other | Admitting: Nurse Practitioner

## 2017-06-22 VITALS — BP 122/74 | HR 70 | Temp 98.3°F | Ht 76.0 in | Wt 175.0 lb

## 2017-06-22 DIAGNOSIS — D631 Anemia in chronic kidney disease: Secondary | ICD-10-CM

## 2017-06-22 DIAGNOSIS — R197 Diarrhea, unspecified: Secondary | ICD-10-CM | POA: Diagnosis not present

## 2017-06-22 DIAGNOSIS — N183 Chronic kidney disease, stage 3 (moderate): Secondary | ICD-10-CM

## 2017-06-22 DIAGNOSIS — R630 Anorexia: Secondary | ICD-10-CM | POA: Diagnosis not present

## 2017-06-22 NOTE — Progress Notes (Signed)
Careteam: Patient Care Team: Gayland Curry, DO as PCP - General (Geriatric Medicine) Elmarie Shiley, MD (Nephrology) Milus Banister, MD (Gastroenterology)  Advanced Directive information Does Patient Have a Medical Advance Directive?: No, Would patient like information on creating a medical advance directive?: No - Patient declined  Allergies  Allergen Reactions  . Azathioprine     REACTION: affected WBC  . Ciprofloxacin     Pt denies any reaction  . Levaquin [Levofloxacin In D5w]     Pt denies any reaction  . Plendil [Felodipine]     Pt denies any reaction    Chief Complaint  Patient presents with  . Follow-up    Pt is being seen for an ED follow up. Pt was seen at [facility] ED on 06/18/17.     HPI: Patient is a 72 y.o. male seen in the office today to follow up ED visit on 06/18/17. Pt with history of chronic kidney disease, Crohn's diarrhea who went to the ED with ongoing lack of appetite after treatment for a colitis several weeks ago.  Labs were stable in ED. Reports he is still able to eat, no N/V or abdominal pain.   Reports he had liquid stools on 3 days ago and took loperamide 2 capsules in the AM and then 1 later on and has not had any diarrhea since. No stools since he took this medication.  Does not like breakfast so does not eat.  Eats well for supper 2 days ago ate- chicken, greens, salad, potatoes and then dessert. Yesterday had a big meal for supper as well. Reports he only eats 1 big meal at day. For a long time this is how it has gone.  Concerned about appetite but eating what he normally eats. Weight has been unchanged.   Review of Systems:  Review of Systems  Constitutional: Negative for chills, fever, malaise/fatigue and weight loss.  Respiratory: Negative for cough, sputum production and shortness of breath.   Cardiovascular: Negative for chest pain and leg swelling.  Gastrointestinal: Negative for abdominal pain, constipation, diarrhea, heartburn, nausea  and vomiting.  Genitourinary: Negative for dysuria, frequency and urgency.  Neurological: Positive for dizziness (ongoing, chronic without increase in symptoms). Negative for weakness and headaches.    Past Medical History:  Diagnosis Date  . Abdominal pain, right lower quadrant   . Abdominal pain, unspecified site   . Acute cholecystitis   . Anemia   . Anemia, unspecified   . Anxiety   . Benign paroxysmal positional vertigo   . Bipolar I disorder, most recent episode (or current) unspecified   . Celiac disease   . Cellulitis and abscess of unspecified site   . Chronic kidney disease, stage III (moderate) (HCC)   . Cough   . Crohn's   . Depression   . Diarrhea   . Dizziness and giddiness   . Dizziness and giddiness   . Dizziness and giddiness   . Dysuria   . Edema   . Encounter for long-term (current) use of other medications   . Essential and other specified forms of tremor   . Essential hypertension, benign   . Gout 2018  . Hypertensive renal disease, benign   . Hypertrophy of prostate without urinary obstruction and other lower urinary tract symptoms (LUTS)   . Hypopotassemia   . Impacted cerumen   . Impotence of organic origin   . Insomnia with sleep apnea, unspecified   . Insomnia with sleep apnea, unspecified   .  Iron deficiency anemia, unspecified   . Loss of weight   . Malnutrition of mild degree ([place])   . 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 Nausea with vomiting   . Neuralgia, neuritis, and radiculitis, unspecified   . Other abnormal blood chemistry   . Other B-complex deficiencies   . Other B-complex deficiencies   . Other extrapyramidal disease and abnormal movement disorder   . Other extrapyramidal disease and abnormal movement disorder   . Other malaise and fatigue   . Other specified disease of white blood cells   . Other specified disease of white blood cells   . Pain in joint, lower leg   . Pain in limb   . Postinflammatory pulmonary fibrosis ([place])   .  Regional enteritis of small intestine ([place])   . Regional enteritis of small intestine with large intestine ([place])   . Renal insufficiency   . Tobacco use disorder   . Unspecified disorder of skin and subcutaneous tissue   . Vertigo 2018  . Viral warts, unspecified   . Viremia, unspecified    Past Surgical History:  Procedure Laterality Date  . CHOLECYSTECTOMY  07-12-2010  . SMALL INTESTINE SURGERY     x 2   Social History:   reports that he quit smoking about 3 years ago. His smoking use included cigarettes. He started smoking about 61 years ago. He has a 49.00 pack-year smoking history. He has never used smokeless tobacco. He reports that he does not drink alcohol or use drugs.  Family History  Problem Relation Age of Onset  . Diabetes Mother        maternal grandmother  . Uterine cancer Mother   . Emphysema Father   . Pneumonia Maternal Grandmother   . Colon cancer Neg Hx     Medications: Patient's Medications  New Prescriptions   No medications on file  Previous Medications   ALLOPURINOL (ZYLOPRIM) 100 MG TABLET    Take 2 tablets (200 mg total) by mouth daily.   AMLODIPINE (NORVASC) 2.5 MG TABLET    Take 2.5 mg by mouth daily.   CALCIUM CARBONATE-VIT D-MIN 600-400 MG-UNIT TABS    Take 1 tablet by mouth 3 (three) times daily.    CHOLECALCIFEROL (VITAMIN D) 1000 UNITS TABLET    2 tablets once a day    DIVALPROEX (DEPAKOTE) 500 MG EC TABLET    Take 1,500 mg by mouth daily.    EPOETIN ALFA (PROCRIT) 16073 UNIT/ML INJECTION    Inject 10,000 Units into the skin every 30 (thirty) days.    FERROUS SULFATE 325 (65 FE) MG TABLET    Take 325 mg by mouth 2 (two) times daily.     INFLIXIMAB (REMICADE) 100 MG INJECTION    Infuse Remicade IV schedule 1 20m/kg every 8 weeks Premedicate with Tylenol 500-6584mby mouth and Benadryl 25-5055my mouth prior to infusion. Last PPD was on 12/2009.    LOPERAMIDE HCL (LOPERAMIDE A-D PO)    Take 1 capsule by mouth as needed (loose stools).     MAGNESIUM OXIDE (MAG-OX) 400 (241.3 MG) MG TABLET    Take 800 mg by mouth 3 (three) times daily.   MECLIZINE (ANTIVERT) 25 MG TABLET    Take 25 mg by mouth 3 (three) times daily as needed.    METOPROLOL SUCCINATE (TOPROL-XL) 50 MG 24 HR TABLET    Take 50 mg by mouth daily. Take with or immediately following a meal.   MULTIPLE VITAMIN (MULTIVITAMIN) TABLET    Take 1 tablet  by mouth daily.     OLANZAPINE (ZYPREXA) 7.5 MG TABLET    Take 1 tablet (7.5 mg total) by mouth at bedtime.   OMEPRAZOLE (PRILOSEC) 20 MG CAPSULE    Take 20 mg by mouth daily before supper.     OXYBUTYNIN (DITROPAN XL) 15 MG 24 HR TABLET    Take 15 mg by mouth daily.     PRAZOSIN (MINIPRESS) 5 MG CAPSULE    Take 5 mg by mouth at bedtime.   VITAMIN E 400 UNIT CAPSULE    Take 400 Units by mouth daily.    Modified Medications   No medications on file  Discontinued Medications   No medications on file     Physical Exam:  Vitals:   06/22/17 1010  BP: 122/74  Pulse: 70  Temp: 98.3 F (36.8 C)  TempSrc: Oral  SpO2: 98%  Weight: 175 lb (79.4 kg)  Height: 6' 4"  (1.93 m)   Body mass index is 21.3 kg/m.  Physical Exam  Constitutional: He is oriented to person, place, and time. He appears well-developed and well-nourished.  HENT:  Head: Normocephalic.  Cardiovascular: Normal rate, regular rhythm and normal heart sounds.  Pulmonary/Chest: Effort normal and breath sounds normal.  Abdominal: Soft. Bowel sounds are normal. He exhibits no distension. There is no tenderness. There is no guarding.  Musculoskeletal: Normal range of motion.  Neurological: He is alert and oriented to person, place, and time.  Skin: Skin is warm and dry.  Psychiatric: He has a normal mood and affect.   Labs reviewed: Basic Metabolic Panel: Recent Labs    05/04/17 0903 05/31/17 1203 06/18/17 1214  NA 141 140 141  K 4.5 4.1 3.5  CL 106 108 111  CO2 28 24 23   GLUCOSE 116* 83 103*  BUN 24 18 14   CREATININE 2.00* 1.77* 1.69*  CALCIUM  9.1 9.0 8.5*   Liver Function Tests: Recent Labs    05/04/17 0903 05/31/17 1203 06/18/17 1214  AST 10 13* 14*  ALT 5* 7* 8*  ALKPHOS  --  53 46  BILITOT 0.3 0.5 0.5  PROT 6.9 7.1 6.9  ALBUMIN  --  3.2* 3.0*   Recent Labs    06/18/17 1214  LIPASE 31   No results for input(s): AMMONIA in the last 8760 hours. CBC: Recent Labs    05/04/17 0903 05/25/17 0901 05/31/17 1203 06/18/17 1214  WBC 8.6  --  9.5 8.8  NEUTROABS 4,773  --  6.2  --   HGB 11.3* 11.2* 11.7* 10.0*  HCT 34.3*  --  37.6* 31.7*  MCV 98.6  --  103.6* 102.6*  PLT 180  --  190 187   Lipid Panel: Recent Labs    05/04/17 0903  CHOL 138  HDL 42  LDLCALC 71  TRIG 176*  CHOLHDL 3.3   TSH: No results for input(s): TSH in the last 8760 hours. A1C: Lab Results  Component Value Date   HGBA1C 4.9 08/10/2015     Assessment/Plan 1. Lack of appetite -overall appetite has been unchanged in the last year. Weight remains stable. He denies n/v/abdominal pain associated with eating. Encouraged 3 meals a day with protein intake. To use protein supplement daily after breakfast since he does not prefer this meal. Educated this is not a meal replacement but supplement with food.   2. Anemia in stage 3 chronic kidney disease (HCC) hgb slightly lower from ED. Continues to follow up with nephrologist for procrit injections and remains on ferrous sulfate  BID. No signs of acute blood loss at this time.   3. Diarrhea, unspecified type With hx of crohn's and recent colitis which has improved. continues to have bouts of chronic diarrhea which resolved with loperamide as needed. Last episode of diarrhea 3 days ago.  Next appt: 09/17/2017 sooner if needed  Janett Billow K. [place], [facility] [phone]

## 2017-06-22 NOTE — Patient Instructions (Addendum)
Try to eat 3 meals a day with good protein and remember to stay well hydrated with water  To use protein supplement drink daily - use this after a meal not to replace meal

## 2017-06-25 ENCOUNTER — Encounter: Payer: Self-pay | Admitting: *Deleted

## 2017-06-25 NOTE — Progress Notes (Signed)
[employer]

## 2017-06-29 ENCOUNTER — Encounter (HOSPITAL_COMMUNITY)
Admission: RE | Admit: 2017-06-29 | Discharge: 2017-06-29 | Disposition: A | Discharge status: Home / Self Care | Payer: Medicare Other | Source: Ambulatory Visit | Attending: Gastroenterology | Admitting: Gastroenterology

## 2017-06-29 ENCOUNTER — Encounter (HOSPITAL_COMMUNITY): Payer: Self-pay

## 2017-06-29 DIAGNOSIS — N183 Chronic kidney disease, stage 3 (moderate): Secondary | ICD-10-CM | POA: Diagnosis not present

## 2017-06-29 DIAGNOSIS — D6489 Other specified anemias: Secondary | ICD-10-CM | POA: Insufficient documentation

## 2017-06-29 LAB — IRON AND TIBC
IRON: 48 ug/dL (ref 45–182)
SATURATION RATIOS: 25 % (ref 17.9–39.5)
TIBC: 190 ug/dL — AB (ref 250–450)
UIBC: 142 ug/dL

## 2017-06-29 LAB — FERRITIN: FERRITIN: 710 ng/mL — AB (ref 24–336)

## 2017-06-29 LAB — HEMOGLOBIN: HEMOGLOBIN: 10.2 g/dL — AB (ref 13.0–17.0)

## 2017-06-29 MED ORDER — EPOETIN ALFA 10000 UNIT/ML IJ SOLN
10000.0000 [IU] | INTRAMUSCULAR | Status: AC
  Administered 2017-06-29: 10000 [IU] via SUBCUTANEOUS
  Filled 2017-06-29: qty 1

## 2017-06-29 NOTE — Discharge Instructions (Signed)
Epoetin Alfa injection °What is this medicine? °EPOETIN ALFA (e POE e tin AL fa) helps your body make more red blood cells. This medicine is used to treat anemia caused by chronic kidney failure, cancer chemotherapy, or HIV-therapy. It may also be used before surgery if you have anemia. °This medicine may be used for other purposes; ask your health care provider or pharmacist if you have questions. °COMMON BRAND NAME(S): Epogen, Procrit °What should I tell my health care provider before I take this medicine? °They need to know if you have any of these conditions: °-blood clotting disorders °-cancer patient not on chemotherapy °-cystic fibrosis °-heart disease, such as angina or heart failure °-hemoglobin level of 12 g/dL or greater °-high blood pressure °-low levels of folate, iron, or vitamin B12 °-seizures °-an unusual or allergic reaction to erythropoietin, albumin, benzyl alcohol, hamster proteins, other medicines, foods, dyes, or preservatives °-pregnant or trying to get pregnant °-breast-feeding °How should I use this medicine? °This medicine is for injection into a vein or under the skin. It is usually given by a health care professional in a hospital or clinic setting. °If you get this medicine at home, you will be taught how to prepare and give this medicine. Use exactly as directed. Take your medicine at regular intervals. Do not take your medicine more often than directed. °It is important that you put your used needles and syringes in a special sharps container. Do not put them in a trash can. If you do not have a sharps container, call your pharmacist or healthcare provider to get one. °A special MedGuide will be given to you by the pharmacist with each prescription and refill. Be sure to read this information carefully each time. °Talk to your pediatrician regarding the use of this medicine in children. While this drug may be prescribed for selected conditions, precautions do apply. °Overdosage: If you  think you have taken too much of this medicine contact a poison control center or emergency room at once. °NOTE: This medicine is only for you. Do not share this medicine with others. °What if I miss a dose? °If you miss a dose, take it as soon as you can. If it is almost time for your next dose, take only that dose. Do not take double or extra doses. °What may interact with this medicine? °Do not take this medicine with any of the following medications: °-darbepoetin alfa °This list may not describe all possible interactions. Give your health care provider a list of all the medicines, herbs, non-prescription drugs, or dietary supplements you use. Also tell them if you smoke, drink alcohol, or use illegal drugs. Some items may interact with your medicine. °What should I watch for while using this medicine? °Your condition will be monitored carefully while you are receiving this medicine. °You may need blood work done while you are taking this medicine. °What side effects may I notice from receiving this medicine? °Side effects that you should report to your doctor or health care professional as soon as possible: °-allergic reactions like skin rash, itching or hives, swelling of the face, lips, or tongue °-breathing problems °-changes in vision °-chest pain °-confusion, trouble speaking or understanding °-feeling faint or lightheaded, falls °-high blood pressure °-muscle aches or pains °-pain, swelling, warmth in the leg °-rapid weight gain °-severe headaches °-sudden numbness or weakness of the face, arm or leg °-trouble walking, dizziness, loss of balance or coordination °-seizures (convulsions) °-swelling of the ankles, feet, hands °-unusually weak or tired °  Side effects that usually do not require medical attention (report to your doctor or health care professional if they continue or are bothersome): °-diarrhea °-fever, chills (flu-like symptoms) °-headaches °-nausea, vomiting °-redness, stinging, or swelling at  site where injected °This list may not describe all possible side effects. Call your doctor for medical advice about side effects. You may report side effects to FDA at 1-800-FDA-1088. °Where should I keep my medicine? °Keep out of the reach of children. °Store in a refrigerator between 2 and 8 degrees C (36 and 46 degrees F). Do not freeze or shake. Throw away any unused portion if using a single-dose vial. Multi-dose vials can be kept in the refrigerator for up to 21 days after the initial dose. Throw away unused medicine. °NOTE: This sheet is a summary. It may not cover all possible information. If you have questions about this medicine, talk to your doctor, pharmacist, or health care provider. °© 2018 Elsevier/Gold Standard (2015-10-01 19:42:31) ° °

## 2017-07-22 DIAGNOSIS — K5090 Crohn's disease, unspecified, without complications: Secondary | ICD-10-CM | POA: Diagnosis not present

## 2017-07-22 DIAGNOSIS — N189 Chronic kidney disease, unspecified: Secondary | ICD-10-CM | POA: Diagnosis not present

## 2017-07-22 DIAGNOSIS — M109 Gout, unspecified: Secondary | ICD-10-CM | POA: Diagnosis not present

## 2017-07-22 DIAGNOSIS — M79673 Pain in unspecified foot: Secondary | ICD-10-CM | POA: Diagnosis not present

## 2017-07-24 DIAGNOSIS — I739 Peripheral vascular disease, unspecified: Secondary | ICD-10-CM | POA: Diagnosis not present

## 2017-07-24 DIAGNOSIS — B351 Tinea unguium: Secondary | ICD-10-CM | POA: Diagnosis not present

## 2017-07-27 ENCOUNTER — Encounter: Payer: Self-pay | Admitting: Internal Medicine

## 2017-07-27 ENCOUNTER — Ambulatory Visit (INDEPENDENT_AMBULATORY_CARE_PROVIDER_SITE_OTHER): Payer: Medicare Other | Admitting: Internal Medicine

## 2017-07-27 VITALS — BP 120/70 | HR 107 | Temp 98.1°F | Ht 76.0 in | Wt 161.0 lb

## 2017-07-27 DIAGNOSIS — R634 Abnormal weight loss: Secondary | ICD-10-CM

## 2017-07-27 DIAGNOSIS — R1310 Dysphagia, unspecified: Secondary | ICD-10-CM | POA: Diagnosis not present

## 2017-07-27 DIAGNOSIS — K228 Other specified diseases of esophagus: Secondary | ICD-10-CM

## 2017-07-27 DIAGNOSIS — K2289 Other specified disease of esophagus: Secondary | ICD-10-CM

## 2017-07-27 DIAGNOSIS — R630 Anorexia: Secondary | ICD-10-CM | POA: Diagnosis not present

## 2017-07-27 NOTE — Progress Notes (Signed)
Location:  [facility] clinic Provider: Kiyana Vazguez L. Mariea Clonts, D.O., C.M.D.  Goals of Care:  Advanced Directives 06/22/2017  Does Patient Have a Medical Advance Directive? No  Type of Advance Directive -  Does patient want to make changes to medical advance directive? -  Copy of [place] in Chart? -  Would patient like information on creating a medical advance directive? No - Patient declined  Pre-existing out of facility DNR order (yellow form or pink MOST form) -   Chief Complaint  Patient presents with  . Acute Visit    weight loss    HPI: Patient is a 72 y.o. male seen today for an acute visit for weight loss.  He is suspecting it is due to his remicade for his Crohn's disease.  Per our records, he weighed 179.5lbs 05/07/17 and has trended down to 161 lbs today.  His BMI is now underweight at 19.6.  He has called the [place] [facility] doctor to check if remicade is the cause.  He is not nauseous, but he does not feel like eating.  He says he's having to throw out more food than he's eating.  No fever.  He has no will to eat.  He is trying even when he goes out to eat.  Has early satiety.  This seems to have been going on since his Crohn's flare-up early in April. He's not had blood in the stools or dark stools and stools are now solid.    Asks about tolterodine to help with OAB b/c he was going to the restroom frequently at church and a friend suggested it.  Advised against due to anticholinergic side effects added to oxybutynin.    He has not had the EGD yet that is needed since in 11/18, his esophagus was thickened.  It hurts if he swallows a lot at once.    Past Medical History:  Diagnosis Date  . Abdominal pain, right lower quadrant   . Abdominal pain, unspecified site   . Acute cholecystitis   . Anemia   . Anemia, unspecified   . Anxiety   . Benign paroxysmal positional vertigo   . Bipolar I disorder, most recent episode (or current) unspecified   . Celiac disease   .  Cellulitis and abscess of unspecified site   . Chronic kidney disease, stage III (moderate) (HCC)   . Cough   . Crohn's   . Depression   . Diarrhea   . Dizziness and giddiness   . Dizziness and giddiness   . Dizziness and giddiness   . Dysuria   . Edema   . Encounter for long-term (current) use of other medications   . Essential and other specified forms of tremor   . Essential hypertension, benign   . Gout 2018  . Hypertensive renal disease, benign   . Hypertrophy of prostate without urinary obstruction and other lower urinary tract symptoms (LUTS)   . Hypopotassemia   . Impacted cerumen   . Impotence of organic origin   . Insomnia with sleep apnea, unspecified   . Insomnia with sleep apnea, unspecified   . Iron deficiency anemia, unspecified   . Loss of weight   . Malnutrition of mild degree ([place])   . 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 Nausea with vomiting   . Neuralgia, neuritis, and radiculitis, unspecified   . Other abnormal blood chemistry   . Other B-complex deficiencies   . Other B-complex deficiencies   . Other extrapyramidal disease and abnormal movement  disorder   . Other extrapyramidal disease and abnormal movement disorder   . Other malaise and fatigue   . Other specified disease of white blood cells   . Other specified disease of white blood cells   . Pain in joint, lower leg   . Pain in limb   . Postinflammatory pulmonary fibrosis ([place])   . Regional enteritis of small intestine ([place])   . Regional enteritis of small intestine with large intestine ([place])   . Renal insufficiency   . Tobacco use disorder   . Unspecified disorder of skin and subcutaneous tissue   . Vertigo 2018  . Viral warts, unspecified   . Viremia, unspecified     Past Surgical History:  Procedure Laterality Date  . CHOLECYSTECTOMY  07-12-2010  . SMALL INTESTINE SURGERY     x 2    Allergies  Allergen Reactions  . Azathioprine     REACTION: affected WBC  . Ciprofloxacin     Pt denies any  reaction  . Levaquin [Levofloxacin In D5w]     Pt denies any reaction  . Plendil [Felodipine]     Pt denies any reaction    Outpatient Encounter Medications as of 07/27/2017  Medication Sig  . allopurinol (ZYLOPRIM) 100 MG tablet Take 2 tablets (200 mg total) by mouth daily.  Marland Kitchen amLODipine (NORVASC) 2.5 MG tablet Take 2.5 mg by mouth daily.  . Calcium Carbonate-Vit D-Min 600-400 MG-UNIT TABS Take 1 tablet by mouth 3 (three) times daily.   . cholecalciferol (VITAMIN D) 1000 units tablet 2 tablets once a day   . divalproex (DEPAKOTE) 500 MG EC tablet Take 1,500 mg by mouth daily.   Marland Kitchen epoetin alfa (PROCRIT) 93734 UNIT/ML injection Inject 10,000 Units into the skin every 30 (thirty) days.   . ferrous sulfate 325 (65 FE) MG tablet Take 325 mg by mouth 2 (two) times daily.    Marland Kitchen inFLIXimab (REMICADE) 100 MG injection Infuse Remicade IV schedule 1 63m/kg every 8 weeks Premedicate with Tylenol 500-6535mby mouth and Benadryl 25-5098my mouth prior to infusion. Last PPD was on 12/2009.   . Loperamide HCl (LOPERAMIDE A-D PO) Take 1 capsule by mouth as needed (loose stools).   . magnesium oxide (MAG-OX) 400 (241.3 Mg) MG tablet Take 800 mg by mouth 3 (three) times daily.  . meclizine (ANTIVERT) 25 MG tablet Take 25 mg by mouth 3 (three) times daily as needed.   . metoprolol succinate (TOPROL-XL) 50 MG 24 hr tablet Take 50 mg by mouth daily. Take with or immediately following a meal.  . Multiple Vitamin (MULTIVITAMIN) tablet Take 1 tablet by mouth daily.    . OMarland KitchenANZapine (ZYPREXA) 7.5 MG tablet Take 1 tablet (7.5 mg total) by mouth at bedtime.  . oMarland Kitcheneprazole (PRILOSEC) 20 MG capsule Take 20 mg by mouth daily before supper.    . oMarland Kitchenybutynin (DITROPAN XL) 15 MG 24 hr tablet Take 15 mg by mouth daily.    . prazosin (MINIPRESS) 5 MG capsule Take 5 mg by mouth at bedtime.  . vitamin E 400 UNIT capsule Take 400 Units by mouth daily.     No facility-administered encounter medications on file as of 07/27/2017.      Review of Systems:  Review of Systems  Constitutional: Positive for weight loss. Negative for chills, fever and malaise/fatigue.  HENT: Negative for congestion and sore throat.   Eyes:       Glasses  Respiratory: Negative for cough and shortness of breath.   Cardiovascular: Negative  for chest pain and palpitations.  Gastrointestinal: Positive for heartburn. Negative for abdominal pain, blood in stool, constipation, diarrhea, melena, nausea and vomiting.       Hurts to swallow large boluses  Genitourinary: Positive for frequency and urgency. Negative for dysuria.       OAB  Musculoskeletal: Negative for falls.  Neurological: Negative for dizziness and loss of consciousness.  Endo/Heme/Allergies: Does not bruise/bleed easily.  Psychiatric/Behavioral:       Bipolar controlled with meds    Health Maintenance  Topic Date Due  . Hepatitis C Screening  09/18/1945  . INFLUENZA VACCINE  09/24/2017  . TETANUS/TDAP  05/25/2021  . COLONOSCOPY  06/20/2025  . PNA vac Low Risk Adult  Completed    Physical Exam: Vitals:   07/27/17 1322  BP: 120/70  Pulse: (!) 107  Temp: 98.1 F (36.7 C)  TempSrc: Oral  SpO2: 95%  Weight: 161 lb (73 kg)  Height: 6' 4"  (1.93 m)   Body mass index is 19.6 kg/m. Physical Exam  Constitutional: He is oriented to person, place, and time.  Cachectic appearing tall male  Cardiovascular: Normal heart sounds and intact distal pulses.  Tachy, regular  Pulmonary/Chest: Effort normal and breath sounds normal. No respiratory distress.  Abdominal: Soft. Bowel sounds are normal. He exhibits no distension. There is no tenderness.  Musculoskeletal: Normal range of motion.  Neurological: He is alert and oriented to person, place, and time.  Skin: Skin is warm and dry.  Psychiatric:  Anxious, pressured speech    Labs reviewed: Basic Metabolic Panel: Recent Labs    05/04/17 0903 05/31/17 1203 06/11/17 06/18/17 1214  NA 141 140 139 141  K 4.5 4.1 3.5  3.5  CL 106 108  --  111  CO2 28 24  --  23  GLUCOSE 116* 83  --  103*  BUN 24 18 18 14   CREATININE 2.00* 1.77* 1.8* 1.69*  CALCIUM 9.1 9.0  --  8.5*   Liver Function Tests: Recent Labs    05/04/17 0903 05/31/17 1203 06/11/17 06/18/17 1214  AST 10 13* 9* 14*  ALT 5* 7* 6* 8*  ALKPHOS  --  53 55 46  BILITOT 0.3 0.5  --  0.5  PROT 6.9 7.1  --  6.9  ALBUMIN  --  3.2*  --  3.0*   Recent Labs    06/18/17 1214  LIPASE 31   No results for input(s): AMMONIA in the last 8760 hours. CBC: Recent Labs    05/04/17 0903  05/31/17 1203 06/11/17 06/18/17 1214 06/29/17 1212  WBC 8.6  --  9.5 8.3 8.8  --   NEUTROABS 4,773  --  6.2  --   --   --   HGB 11.3*   < > 11.7* 10.6* 10.0* 10.2*  HCT 34.3*  --  37.6* 33* 31.7*  --   MCV 98.6  --  103.6*  --  102.6*  --   PLT 180  --  190 171 187  --    < > = values in this interval not displayed.   Lipid Panel: Recent Labs    05/04/17 0903  CHOL 138  HDL 42  LDLCALC 71  TRIG 176*  CHOLHDL 3.3   Lab Results  Component Value Date   HGBA1C 4.9 08/10/2015   Assessment/Plan 1. Weight loss, unintentional -suspect due to #4, likely needs dilation with EGD, may need biopsy to r/o malignancy  2. Loss of appetite for more than 2 weeks -progressive lately  3. Odynophagia -suspect related to #4  4. Esophageal thickening -has not had evaluation of this by EGD--not until July and weight loss, pain, poor appetite worsening -he is going to call the [place] and go up there if he does not hear back  Labs/tests ordered:  No orders of the defined types were placed in this encounter.  Next appt:  09/17/2017  Cassy Sprowl L. Mesa Janus, D.O. [facility] [address], [place] [address] Cell Phone (Mon-Fri 8am-5pm):  [phone] On Call:  [phone] & follow prompts after 5pm & weekends Office Phone:  [phone] Office Fax:  [phone]

## 2017-07-27 NOTE — Patient Instructions (Addendum)
It's very important that you get your endoscopy done as soon as possible to evaluate the thickening of your esophagus.  I suspect this is why your appetite is poor and you are losing weight.    Try to eat small amounts at a time and chew your food up really well before swallowing.

## 2017-07-30 ENCOUNTER — Encounter (HOSPITAL_COMMUNITY): Payer: Self-pay

## 2017-07-30 ENCOUNTER — Encounter (HOSPITAL_COMMUNITY)
Admission: RE | Admit: 2017-07-30 | Discharge: 2017-07-30 | Disposition: A | Discharge status: Home / Self Care | Payer: Medicare Other | Source: Ambulatory Visit | Attending: Gastroenterology | Admitting: Gastroenterology

## 2017-07-30 DIAGNOSIS — N183 Chronic kidney disease, stage 3 (moderate): Secondary | ICD-10-CM | POA: Diagnosis not present

## 2017-07-30 DIAGNOSIS — D6489 Other specified anemias: Secondary | ICD-10-CM | POA: Diagnosis not present

## 2017-07-30 LAB — IRON AND TIBC
IRON: 50 ug/dL (ref 45–182)
SATURATION RATIOS: 23 % (ref 17.9–39.5)
TIBC: 215 ug/dL — AB (ref 250–450)
UIBC: 165 ug/dL

## 2017-07-30 LAB — HEMOGLOBIN: Hemoglobin: 10.4 g/dL — ABNORMAL LOW (ref 13.0–17.0)

## 2017-07-30 LAB — FERRITIN: Ferritin: 733 ng/mL — ABNORMAL HIGH (ref 24–336)

## 2017-07-30 MED ORDER — EPOETIN ALFA 10000 UNIT/ML IJ SOLN
10000.0000 [IU] | INTRAMUSCULAR | Status: DC
  Administered 2017-07-30: 10000 [IU] via SUBCUTANEOUS
  Filled 2017-07-30: qty 1

## 2017-08-14 ENCOUNTER — Other Ambulatory Visit (HOSPITAL_COMMUNITY): Payer: Self-pay | Admitting: *Deleted

## 2017-08-17 ENCOUNTER — Encounter: Payer: Self-pay | Admitting: Internal Medicine

## 2017-08-17 ENCOUNTER — Ambulatory Visit (INDEPENDENT_AMBULATORY_CARE_PROVIDER_SITE_OTHER): Payer: Medicare Other | Admitting: Internal Medicine

## 2017-08-17 VITALS — BP 120/60 | HR 81 | Temp 98.4°F | Ht 76.0 in | Wt 157.0 lb

## 2017-08-17 DIAGNOSIS — M7582 Other shoulder lesions, left shoulder: Secondary | ICD-10-CM | POA: Diagnosis not present

## 2017-08-17 DIAGNOSIS — R634 Abnormal weight loss: Secondary | ICD-10-CM | POA: Diagnosis not present

## 2017-08-17 NOTE — Patient Instructions (Addendum)
Use ice on your left shoulder for 20 minutes 4 times a day.   Also avoid lifting things over 10 lbs You may take tylenol up to 3070m per day if you hurt.   Avoid nsaids like naproxen, meloxicam, ibuprofen, etc.  These will worsen your kidneys and make your stomach prone to bleeding.   Rotator Cuff Tendinitis Rotator cuff tendinitis is inflammation of the tough, cord-like bands that connect muscle to bone (tendons) in the rotator cuff. The rotator cuff includes all of the muscles and tendons that connect the arm to the shoulder. The rotator cuff holds the head of the upper arm bone (humerus) in the cup (fossa) of the shoulder blade (scapula). This condition can lead to a long-lasting (chronic) tear. The tear may be partial or complete. What are the causes? This condition is usually caused by overusing the rotator cuff. What increases the risk? This condition is more likely to develop in athletes and workers who frequently use their shoulder or reach over their heads. This can include activities such as:  Tennis.  Baseball or softball.  Swimming.  Construction work.  Painting.  What are the signs or symptoms? Symptoms of this condition include:  Pain spreading (radiating) from the shoulder to the upper arm.  Swelling and tenderness in front of the shoulder.  Pain when reaching, pulling, or lifting the arm above the head.  Pain when lowering the arm from above the head.  Minor pain in the shoulder when resting.  Increased pain in the shoulder at night.  Difficulty placing the arm behind the back.  How is this diagnosed? This condition is diagnosed with a medical history and physical exam. Tests may also be done, including:  X-rays.  MRI.  Ultrasounds.  CT or MR arthrogram. During this test, a contrast material is injected and then images are taken.  How is this treated? Treatment for this condition depends on the severity of the condition. In less severe cases,  treatment may include:  Rest. This may be done with a sling that holds the shoulder still (immobilization). Your health care provider may also recommend avoiding activities that involve lifting your arm over your head.  Icing the shoulder.  Anti-inflammatory medicines, such as aspirin or ibuprofen.  In more severe cases, treatment may include:  Physical therapy.  Steroid injections.  Surgery.  Follow these instructions at home: If you have a sling:  Wear the sling as told by your health care provider. Remove it only as told by your health care provider.  Loosen the sling if your fingers tingle, become numb, or turn cold and blue.  Keep the sling clean.  If the sling is not waterproof, do not let it get wet. Remove it, if allowed, or cover it with a watertight covering when you take a bath or shower. Managing pain, stiffness, and swelling  If directed, put ice on the injured area. ? If you have a removable sling, remove it as told by your health care provider. ? Put ice in a plastic bag. ? Place a towel between your skin and the bag. ? Leave the ice on for 20 minutes, 2-3 times a day.  Move your fingers often to avoid stiffness and to lessen swelling.  Raise (elevate) the injured area above the level of your heart while you are lying down.  Find a comfortable sleeping position or sleep on a recliner, if available. Driving  Do not drive or use heavy machinery while taking prescription pain medicine.  Ask your health care provider when it is safe to drive if you have a sling on your arm. Activity  Rest your shoulder as told by your health care provider.  Return to your normal activities as told by your health care provider. Ask your health care provider what activities are safe for you.  Do any exercises or stretches as told by your health care provider.  If you do repetitive overhead tasks, take small breaks in between and include stretching exercises as told by your  health care provider. General instructions  Do not use any products that contain nicotine or tobacco, such as cigarettes and e-cigarettes. These can delay healing. If you need help quitting, ask your health care provider.  Take over-the-counter and prescription medicines only as told by your health care provider.  Keep all follow-up visits as told by your health care provider. This is important. Contact a health care provider if:  Your pain gets worse.  You have new pain in your arm, hands, or fingers.  Your pain is not relieved with medicine or does not get better after 6 weeks of treatment.  You have cracking sensations when moving your shoulder in certain directions.  You hear a snapping sound after using your shoulder, followed by severe pain and weakness. Get help right away if:  Your arm, hand, or fingers are numb or tingling.  Your arm, hand, or fingers are swollen or painful or they turn white or blue. Summary  Rotator cuff tendinitis is inflammation of the tough, cord-like bands that connect muscle to bone (tendons) in the rotator cuff.  This condition is usually caused by overusing the rotator cuff, which includes all of the muscles and tendons that connect the arm to the shoulder.  This condition is more likely to develop in athletes and workers who frequently use their shoulder or reach over their heads.  Treatment generally includes rest, anti-inflammatory medicines, and icing. In some cases, physical therapy and steroid injections may be needed. In severe cases, surgery may be needed. This information is not intended to replace advice given to you by your health care provider. Make sure you discuss any questions you have with your health care provider. Document Released: 05/03/2003 Document Revised: 01/28/2016 Document Reviewed: 01/28/2016 Elsevier Interactive Patient Education  2017 [employer].

## 2017-08-17 NOTE — Progress Notes (Signed)
Location:  [facility] clinic Provider: Morris Markham L. Mariea Clonts, D.O., C.M.D.   Goals of Care:  Advanced Directives 06/22/2017  Does Patient Have a Medical Advance Directive? No  Type of Advance Directive -  Does patient want to make changes to medical advance directive? -  Copy of [place] in Chart? -  Would patient like information on creating a medical advance directive? No - Patient declined  Pre-existing out of facility DNR order (yellow form or pink MOST form) -     Chief Complaint  Patient presents with  . Acute Visit    left shoulder pain    HPI: Patient is a 72 y.o. male seen today for an acute visit for left shoulder pain that's going on for months, but much worse now.  He's unable to lift it beyond about 30 degrees.  No known injury.  No falls.  Has not tried ice or pills.  He did try an otc cream on it like aspercreme, but it did not help.  It hurts the worst if he rolls on it at night.  Does not hurt at rest.  Feels it, but not hurting.  He is righthanded.  It actually comes and goes to some extent.    He lost 4 more lbs.  His swallowing seems a little better.    Past Medical History:  Diagnosis Date  . Abdominal pain, right lower quadrant   . Abdominal pain, unspecified site   . Acute cholecystitis   . Anemia   . Anemia, unspecified   . Anxiety   . Benign paroxysmal positional vertigo   . Bipolar I disorder, most recent episode (or current) unspecified   . Celiac disease   . Cellulitis and abscess of unspecified site   . Chronic kidney disease, stage III (moderate) (HCC)   . Cough   . Crohn's   . Depression   . Diarrhea   . Dizziness and giddiness   . Dizziness and giddiness   . Dizziness and giddiness   . Dysuria   . Edema   . Encounter for long-term (current) use of other medications   . Essential and other specified forms of tremor   . Essential hypertension, benign   . Gout 2018  . Hypertensive renal disease, benign   . Hypertrophy of  prostate without urinary obstruction and other lower urinary tract symptoms (LUTS)   . Hypopotassemia   . Impacted cerumen   . Impotence of organic origin   . Insomnia with sleep apnea, unspecified   . Insomnia with sleep apnea, unspecified   . Iron deficiency anemia, unspecified   . Loss of weight   . Malnutrition of mild degree ([place])   . 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 Nausea with vomiting   . Neuralgia, neuritis, and radiculitis, unspecified   . Other abnormal blood chemistry   . Other B-complex deficiencies   . Other B-complex deficiencies   . Other extrapyramidal disease and abnormal movement disorder   . Other extrapyramidal disease and abnormal movement disorder   . Other malaise and fatigue   . Other specified disease of white blood cells   . Other specified disease of white blood cells   . Pain in joint, lower leg   . Pain in limb   . Postinflammatory pulmonary fibrosis ([place])   . Regional enteritis of small intestine ([place])   . Regional enteritis of small intestine with large intestine ([place])   . Renal insufficiency   . Tobacco use disorder   .  Unspecified disorder of skin and subcutaneous tissue   . Vertigo 2018  . Viral warts, unspecified   . Viremia, unspecified     Past Surgical History:  Procedure Laterality Date  . CHOLECYSTECTOMY  07-12-2010  . SMALL INTESTINE SURGERY     x 2    Allergies  Allergen Reactions  . Azathioprine     REACTION: affected WBC  . Ciprofloxacin     Pt denies any reaction  . Levaquin [Levofloxacin In D5w]     Pt denies any reaction  . Plendil [Felodipine]     Pt denies any reaction    Outpatient Encounter Medications as of 08/17/2017  Medication Sig  . allopurinol (ZYLOPRIM) 100 MG tablet Take 2 tablets (200 mg total) by mouth daily.  Marland Kitchen amLODipine (NORVASC) 2.5 MG tablet Take 2.5 mg by mouth daily.  . Calcium Carbonate-Vit D-Min 600-400 MG-UNIT TABS Take 1 tablet by mouth 3 (three) times daily.   . cholecalciferol (VITAMIN D) 1000  units tablet 2 tablets once a day   . divalproex (DEPAKOTE) 500 MG EC tablet Take 1,500 mg by mouth daily.   Marland Kitchen epoetin alfa (PROCRIT) 25427 UNIT/ML injection Inject 10,000 Units into the skin every 30 (thirty) days.   . ferrous sulfate 325 (65 FE) MG tablet Take 325 mg by mouth 2 (two) times daily.    Marland Kitchen inFLIXimab (REMICADE) 100 MG injection Infuse Remicade IV schedule 1 93m/kg every 8 weeks Premedicate with Tylenol 500-6581mby mouth and Benadryl 25-5060my mouth prior to infusion. Last PPD was on 12/2009.   . Loperamide HCl (LOPERAMIDE A-D PO) Take 1 capsule by mouth as needed (loose stools).   . magnesium oxide (MAG-OX) 400 (241.3 Mg) MG tablet Take 800 mg by mouth 3 (three) times daily.  . meclizine (ANTIVERT) 25 MG tablet Take 25 mg by mouth 3 (three) times daily as needed.   . metoprolol succinate (TOPROL-XL) 50 MG 24 hr tablet Take 50 mg by mouth daily. Take with or immediately following a meal.  . Multiple Vitamin (MULTIVITAMIN) tablet Take 1 tablet by mouth daily.    . OMarland KitchenANZapine (ZYPREXA) 7.5 MG tablet Take 1 tablet (7.5 mg total) by mouth at bedtime.  . oMarland Kitcheneprazole (PRILOSEC) 20 MG capsule Take 20 mg by mouth daily before supper.    . oMarland Kitchenybutynin (DITROPAN XL) 15 MG 24 hr tablet Take 15 mg by mouth daily.    . prazosin (MINIPRESS) 5 MG capsule Take 5 mg by mouth at bedtime.  . vitamin E 400 UNIT capsule Take 400 Units by mouth daily.     No facility-administered encounter medications on file as of 08/17/2017.     Review of Systems:  Review of Systems  Constitutional: Negative for chills, fever and malaise/fatigue.  Respiratory: Negative for shortness of breath.   Cardiovascular: Negative for chest pain, palpitations and leg swelling.  Gastrointestinal: Negative for abdominal pain.  Musculoskeletal: Positive for joint pain and myalgias.       Decreased abduction left arm  Neurological: Negative for focal weakness and weakness.  Psychiatric/Behavioral:       Bipolar controlled      Health Maintenance  Topic Date Due  . Hepatitis C Screening  02/08-Dec-1945 INFLUENZA VACCINE  09/24/2017  . TETANUS/TDAP  05/25/2021  . COLONOSCOPY  06/20/2025  . PNA vac Low Risk Adult  Completed    Physical Exam: Vitals:   08/17/17 1454  BP: 120/60  Pulse: 81  Temp: 98.4 F (36.9 C)  TempSrc: Oral  SpO2:  97%  Weight: 157 lb (71.2 kg)  Height: 6' 4"  (1.93 m)   Body mass index is 19.11 kg/m. Physical Exam  Constitutional: He is oriented to person, place, and time.  Thin male  Cardiovascular: Normal rate, regular rhythm, normal heart sounds and intact distal pulses.  Pulmonary/Chest: Effort normal and breath sounds normal. No respiratory distress.  Abdominal: Bowel sounds are normal.  Musculoskeletal:  Decreased active abduction (only to 60 degrees) for left arm, but passive abduction to 120 degrees; tender lateral shoulder; negative drop arm test  Neurological: He is alert and oriented to person, place, and time.  Skin: Skin is warm and dry.    Labs reviewed: Basic Metabolic Panel: Recent Labs    05/04/17 0903 05/31/17 1203 06/11/17 06/18/17 1214  NA 141 140 139 141  K 4.5 4.1 3.5 3.5  CL 106 108  --  111  CO2 28 24  --  23  GLUCOSE 116* 83  --  103*  BUN 24 18 18 14   CREATININE 2.00* 1.77* 1.8* 1.69*  CALCIUM 9.1 9.0  --  8.5*   Liver Function Tests: Recent Labs    05/04/17 0903 05/31/17 1203 06/11/17 06/18/17 1214  AST 10 13* 9* 14*  ALT 5* 7* 6* 8*  ALKPHOS  --  53 55 46  BILITOT 0.3 0.5  --  0.5  PROT 6.9 7.1  --  6.9  ALBUMIN  --  3.2*  --  3.0*   Recent Labs    06/18/17 1214  LIPASE 31   No results for input(s): AMMONIA in the last 8760 hours. CBC: Recent Labs    05/04/17 0903  05/31/17 1203 06/11/17 06/18/17 1214 06/29/17 1212 07/30/17 0958  WBC 8.6  --  9.5 8.3 8.8  --   --   NEUTROABS 4,773  --  6.2  --   --   --   --   HGB 11.3*   < > 11.7* 10.6* 10.0* 10.2* 10.4*  HCT 34.3*  --  37.6* 33* 31.7*  --   --   MCV 98.6  --   103.6*  --  102.6*  --   --   PLT 180  --  190 171 187  --   --    < > = values in this interval not displayed.   Lipid Panel: Recent Labs    05/04/17 0903  CHOL 138  HDL 42  LDLCALC 71  TRIG 176*  CHOLHDL 3.3   Lab Results  Component Value Date   HGBA1C 4.9 08/10/2015   Assessment/Plan 1. Rotator cuff tendonitis, left - advised to avoid heavy lifting (beyond 10 lbs) and use ice 20 mins qid to area, may also take tylenol, must avoid nsaids with ckd and colitis - AMB referral to orthopedics for more extensive eval--?any rotator cuff partial tear, ?injection or just some PT  2. Weight loss, unintentional -ongoing, says swallowing is a bit better, July appt at [facility] for EGD  Labs/tests ordered:   Orders Placed This Encounter  Procedures  . AMB referral to orthopedics    Referral Priority:   Routine    Referral Type:   Consultation    Number of Visits Requested:   1    Next appt:  09/17/2017  Madinah Quarry L. Kaitlynd Phillips, D.O. [facility] [address], [place] [address] Cell Phone (Mon-Fri 8am-5pm):  [phone] On Call:  [phone] & follow prompts after 5pm & weekends Office Phone:  [phone] Office  Fax:  [phone]

## 2017-08-26 ENCOUNTER — Ambulatory Visit (INDEPENDENT_AMBULATORY_CARE_PROVIDER_SITE_OTHER): Payer: Medicare Other | Admitting: Orthopaedic Surgery

## 2017-08-26 ENCOUNTER — Encounter (INDEPENDENT_AMBULATORY_CARE_PROVIDER_SITE_OTHER): Payer: Self-pay | Admitting: Orthopaedic Surgery

## 2017-08-26 ENCOUNTER — Ambulatory Visit (INDEPENDENT_AMBULATORY_CARE_PROVIDER_SITE_OTHER): Payer: Self-pay

## 2017-08-26 DIAGNOSIS — M25512 Pain in left shoulder: Secondary | ICD-10-CM

## 2017-08-26 MED ORDER — METHYLPREDNISOLONE ACETATE 40 MG/ML IJ SUSP
40.0000 mg | INTRAMUSCULAR | Status: AC | PRN
  Administered 2017-08-26: 40 mg via INTRA_ARTICULAR

## 2017-08-26 MED ORDER — LIDOCAINE HCL 1 % IJ SOLN
3.0000 mL | INTRAMUSCULAR | Status: AC | PRN
  Administered 2017-08-26: 3 mL

## 2017-08-26 NOTE — Progress Notes (Signed)
Office Visit Note   Patient: Brent [name]           Date of Birth: [birth year]           MRN: [id number] Visit Date: 08/26/2017              Requested by: Gayland Curry, DO [place], [place] [address] PCP: Gayland Curry, DO   Assessment & Plan: Visit Diagnoses:  1. Acute pain of left shoulder     Plan: He is shown pendulum, wall crawls and Codman exercises.  He will follow-up with [name] on an as-needed basis.  See him back in 2 weeks check his progress lack of.  If he is doing well he can call and cancel the appointment.  Follow-Up Instructions: Return in about 2 weeks (around 09/09/2017).   Orders:  Orders Placed This Encounter  Procedures  . Large Joint Inj  . XR Shoulder Left   No orders of the defined types were placed in this encounter.     Procedures: Large Joint Inj: L subacromial bursa on 08/26/2017 10:21 AM Indications: pain Details: 22 G 1.5 in needle, superior approach  Arthrogram: No  Medications: 3 mL lidocaine 1 %; 40 mg methylPREDNISolone acetate 40 MG/ML Outcome: tolerated well, no immediate complications Procedure, treatment alternatives, risks and benefits explained, specific risks discussed. Consent was given by the patient. Immediately prior to procedure a time out was called to verify the correct patient, procedure, equipment, support staff and site/side marked as required. Patient was prepped and draped in the usual sterile fashion.       Clinical Data: No additional findings.   Subjective: Chief Complaint  Patient presents with  . Left Shoulder - Pain    HPI Brent [name] is a 72 year old male were seen for the first time for left shoulder pain has been ongoing for the past month.  He had no known injury.  Has no radicular symptoms down the arm.  Pain is deep within the shoulder.  Wakes him at night is worse with abduction.  He is tried ice and Voltaren gel with no relief.  He is stage III renal failure.  Nondiabetic. Review of  Systems No fevers chills shortness of breath chest pain please see HPI otherwise negative  Objective: Vital Signs: There were no vitals taken for this visit.  Physical Exam  Constitutional: He is oriented to person, place, and time. He appears well-developed and well-nourished. No distress.  Pulmonary/Chest: Effort normal.  Neurological: He is alert and oriented to person, place, and time.  Skin: He is not diaphoretic.  Psychiatric: He has a normal mood and affect.    Ortho Exam Bilateral shoulders she has 5 out of 5 strength with external and internal rotation against resistance.  Has full forward flexion bilateral shoulders without pain.  Empty can test weakness on the left negative on the right.  Impingement testing is negative bilaterally.  Nontender about the left shoulder girdle.  No rashes skin lesions ulcerations over the left shoulder girdle. Specialty Comments:  No specialty comments available.  Imaging: Xr Shoulder Left  Result Date: 08/26/2017 3 views left shoulder: No subluxation dislocation.  Glenohumeral joint is well-maintained.  No significant arthritic changes the glenohumeral joint.  Has moderate AC joint changes.  Y-view shows the subacromial space to be well-maintained.    PMFS History: Patient Active Problem List   Diagnosis Date Noted  . History of smoking 30 or more pack years 01/05/2017  . Postinflammatory  pulmonary fibrosis ([place]) 08/16/2015  . Bipolar 1 disorder ([place])   . BPH (benign prostatic hyperplasia) 03/31/2013  . 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 Essential hypertension, benign 03/31/2013  . Chronic kidney disease (CKD), stage III (moderate) ([place]) 06/04/2012  . 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[place]) 01/23/2010   Past Medical History:  Diagnosis Date  . Abdominal pain, right lower quadrant   . Abdominal pain, unspecified site   . Acute cholecystitis   . Anemia   . Anemia, unspecified   . Anxiety   . Benign paroxysmal positional  vertigo   . Bipolar I disorder, most recent episode (or current) unspecified   . Celiac disease   . Cellulitis and abscess of unspecified site   . Chronic kidney disease, stage III (moderate) (HCC)   . Cough   . Crohn's   . Depression   . Diarrhea   . Dizziness and giddiness   . Dizziness and giddiness   . Dizziness and giddiness   . Dysuria   . Edema   . Encounter for long-term (current) use of other medications   . Essential and other specified forms of tremor   . Essential hypertension, benign   . Gout 2018  . Hypertensive renal disease, benign   . Hypertrophy of prostate without urinary obstruction and other lower urinary tract symptoms (LUTS)   . Hypopotassemia   . Impacted cerumen   . Impotence of organic origin   . Insomnia with sleep apnea, unspecified   . Insomnia with sleep apnea, unspecified   . Iron deficiency anemia, unspecified   . Loss of weight   . Malnutrition of mild degree ([employer])   . 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 Nausea with vomiting   . Neuralgia, neuritis, and radiculitis, unspecified   . Other abnormal blood chemistry   . Other B-complex deficiencies   . Other B-complex deficiencies   . Other extrapyramidal disease and abnormal movement disorder   . Other extrapyramidal disease and abnormal movement disorder   . Other malaise and fatigue   . Other specified disease of white blood cells   . Other specified disease of white blood cells   . Pain in joint, lower leg   . Pain in limb   . Postinflammatory pulmonary fibrosis ([place])   . Regional enteritis of small intestine ([place])   . Regional enteritis of small intestine with large intestine ([place])   . Renal insufficiency   . Tobacco use disorder   . Unspecified disorder of skin and subcutaneous tissue   . Vertigo 2018  . Viral warts, unspecified   . Viremia, unspecified     Family History  Problem Relation Age of Onset  . Diabetes Mother        maternal grandmother  . Uterine cancer Mother   . Emphysema  Father   . Pneumonia Maternal Grandmother   . Colon cancer Neg Hx     Past Surgical History:  Procedure Laterality Date  . CHOLECYSTECTOMY  07-12-2010  . SMALL INTESTINE SURGERY     x 2   Social History   Occupational History  . Occupation: retired  . Occupation: Veteran  Tobacco Use  . Smoking status: Former Smoker    Packs/day: 1.00    Years: 49.00    Pack years: 49.00    Types: Cigarettes    Start date: 04/11/1956    Last attempt to quit: 04/17/2014    Years since quitting: 3.3  . Smokeless tobacco: Never Used  Substance and Sexual Activity  .  Alcohol use: No    Alcohol/week: 0.0 oz  . Drug use: No  . Sexual activity: Never

## 2017-08-31 ENCOUNTER — Encounter (HOSPITAL_COMMUNITY): Payer: Self-pay

## 2017-08-31 ENCOUNTER — Encounter (HOSPITAL_COMMUNITY)
Admission: RE | Admit: 2017-08-31 | Discharge: 2017-08-31 | Disposition: A | Discharge status: Home / Self Care | Payer: Medicare Other | Source: Ambulatory Visit | Attending: Gastroenterology | Admitting: Gastroenterology

## 2017-08-31 DIAGNOSIS — N183 Chronic kidney disease, stage 3 (moderate): Secondary | ICD-10-CM | POA: Insufficient documentation

## 2017-08-31 DIAGNOSIS — D6489 Other specified anemias: Secondary | ICD-10-CM | POA: Diagnosis not present

## 2017-08-31 LAB — FERRITIN: FERRITIN: 689 ng/mL — AB (ref 24–336)

## 2017-08-31 LAB — IRON AND TIBC
Iron: 58 ug/dL (ref 45–182)
SATURATION RATIOS: 26 % (ref 17.9–39.5)
TIBC: 227 ug/dL — ABNORMAL LOW (ref 250–450)
UIBC: 169 ug/dL

## 2017-08-31 LAB — HEMOGLOBIN: Hemoglobin: 10.3 g/dL — ABNORMAL LOW (ref 13.0–17.0)

## 2017-08-31 MED ORDER — EPOETIN ALFA 10000 UNIT/ML IJ SOLN
10000.0000 [IU] | INTRAMUSCULAR | Status: AC
  Administered 2017-08-31: 10000 [IU] via SUBCUTANEOUS
  Filled 2017-08-31: qty 1

## 2017-09-09 ENCOUNTER — Ambulatory Visit (INDEPENDENT_AMBULATORY_CARE_PROVIDER_SITE_OTHER): Payer: Medicare Other | Admitting: Orthopaedic Surgery

## 2017-09-16 LAB — HM COLONOSCOPY

## 2017-09-17 ENCOUNTER — Ambulatory Visit (INDEPENDENT_AMBULATORY_CARE_PROVIDER_SITE_OTHER): Payer: Medicare Other | Admitting: Internal Medicine

## 2017-09-17 ENCOUNTER — Encounter: Payer: Self-pay | Admitting: Internal Medicine

## 2017-09-17 VITALS — BP 110/60 | HR 81 | Temp 98.2°F | Ht 76.0 in | Wt 159.0 lb

## 2017-09-17 DIAGNOSIS — F319 Bipolar disorder, unspecified: Secondary | ICD-10-CM

## 2017-09-17 DIAGNOSIS — B3781 Candidal esophagitis: Secondary | ICD-10-CM | POA: Diagnosis not present

## 2017-09-17 DIAGNOSIS — R1310 Dysphagia, unspecified: Secondary | ICD-10-CM

## 2017-09-17 DIAGNOSIS — R634 Abnormal weight loss: Secondary | ICD-10-CM

## 2017-09-17 DIAGNOSIS — K50011 Crohn's disease of small intestine with rectal bleeding: Secondary | ICD-10-CM

## 2017-09-17 DIAGNOSIS — N183 Chronic kidney disease, stage 3 unspecified: Secondary | ICD-10-CM

## 2017-09-17 MED ORDER — FLUCONAZOLE 150 MG PO TABS: ORAL_TABLET | ORAL | 0 refills | Status: DC

## 2017-09-17 NOTE — Progress Notes (Signed)
Location:  [facility] clinic Provider:  Ethelmae Ringel L. Mariea Clonts, D.O., C.M.D.  Code Status: full code Goals of Care:  Advanced Directives 09/17/2017  Does Patient Have a Medical Advance Directive? No  Type of Advance Directive -  Does patient want to make changes to medical advance directive? -  Copy of [place] in Chart? -  Would patient like information on creating a medical advance directive? No - Patient declined  Pre-existing out of facility DNR order (yellow form or pink MOST form) -  I've encouraged him to get his living will and hcpoa arranged, but he has not followed through on this yet   Chief Complaint  Patient presents with  . Medical Management of Chronic Issues    105[facility] follow-up    HPI: Patient is a 72y.o. male seen today for medical management of chronic diseases.    He's already gained 2 lbs after his candidal esophagitis was diagnosed.  He's been started on fluconazole for 2 wks.  He had some gastritis on his EGD, but will need a repeat in 8-12 wks to ensure no other underlying pathology that could not be seen with the yeast being so severe.    [place] [facility]    [place] [facility] [place] any record of this.  Reminded him to check on this and getting shingrix vaccines there.  Past Medical History:  Diagnosis Date  . Abdominal pain, right lower quadrant   . Abdominal pain, unspecified site   . Acute cholecystitis   . Anemia   . Anemia, unspecified   . Anxiety   . Benign paroxysmal positional vertigo   . Bipolar I disorder, most recent episode (or current) unspecified   . Celiac disease   . Cellulitis and abscess of unspecified site   . Chronic kidney disease, stage III (moderate) (HCC)   . Cough   . Crohn's   . Depression   . Diarrhea   . Dizziness and giddiness   . Dizziness and giddiness   . Dizziness and giddiness   . Dysuria   . Edema   . Encounter for long-term (current) use  of other medications   . Essential and other specified forms of tremor   . Essential hypertension, benign   . Gout 2018  . Hypertensive renal disease, benign   . Hypertrophy of prostate without urinary obstruction and other lower urinary tract symptoms (LUTS)   . Hypopotassemia   . Impacted cerumen   . Impotence of organic origin   . Insomnia with sleep apnea, unspecified   . Insomnia with sleep apnea, unspecified   . Iron deficiency anemia, unspecified   . Loss of weight   . Malnutrition of mild degree ([place]   . 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 Nausea with vomiting   . Neuralgia, neuritis, and radiculitis, unspecified   . Other abnormal blood chemistry   . Other B-complex deficiencies   . Other B-complex deficiencies   . Other extrapyramidal disease and abnormal movement disorder   . Other extrapyramidal disease and abnormal movement disorder   . Other malaise and fatigue   . Other specified disease of white blood cells   . Other specified disease of white blood cells   . Pain in joint, lower leg   . Pain in limb   . 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 Regional enteritis of small intestine ([place]   . Regional enteritis of  small intestine with large intestine ([place])   . Renal insufficiency   . Tobacco use disorder   . Unspecified disorder of skin and subcutaneous tissue   . Vertigo 2018  . Viral warts, unspecified   . Viremia, unspecified     Past Surgical History:  Procedure Laterality Date  . CHOLECYSTECTOMY  07-12-2010  . SMALL INTESTINE SURGERY     x 2    Allergies  Allergen Reactions  . Azathioprine     REACTION: affected WBC  . Ciprofloxacin     Pt denies any reaction  . Levaquin [Levofloxacin In D5w]     Pt denies any reaction  . Plendil [Felodipine]     Pt denies any reaction    Outpatient Encounter Medications as of 09/17/2017  Medication Sig  . allopurinol (ZYLOPRIM) 100 MG tablet Take 2 tablets (200 mg total) by mouth daily.  Marland Kitchen amLODipine (NORVASC)  2.5 MG tablet Take 2.5 mg by mouth daily.  . Calcium Carbonate-Vit D-Min 600-400 MG-UNIT TABS Take 1 tablet by mouth 3 (three) times daily.   . cholecalciferol (VITAMIN D) 1000 units tablet 2 tablets once a day   . divalproex (DEPAKOTE) 500 MG EC tablet Take 1,500 mg by mouth daily.   Marland Kitchen epoetin alfa (PROCRIT) 68127 UNIT/ML injection Inject 10,000 Units into the skin every 30 (thirty) days.   . ferrous sulfate 325 (65 FE) MG tablet Take 325 mg by mouth 2 (two) times daily.    Marland Kitchen inFLIXimab (REMICADE) 100 MG injection Infuse Remicade IV schedule 1 74m/kg every 8 weeks Premedicate with Tylenol 500-651mby mouth and Benadryl 25-5082my mouth prior to infusion. Last PPD was on 12/2009.   . Loperamide HCl (LOPERAMIDE A-D PO) Take 1 capsule by mouth as needed (loose stools).   . magnesium oxide (MAG-OX) 400 (241.3 Mg) MG tablet Take 800 mg by mouth 3 (three) times daily.  . meclizine (ANTIVERT) 25 MG tablet Take 25 mg by mouth 3 (three) times daily as needed.   . metoprolol succinate (TOPROL-XL) 50 MG 24 hr tablet Take 50 mg by mouth daily. Take with or immediately following a meal.  . Multiple Vitamin (MULTIVITAMIN) tablet Take 1 tablet by mouth daily.    . OMarland KitchenANZapine (ZYPREXA) 7.5 MG tablet Take 1 tablet (7.5 mg total) by mouth at bedtime.  . oMarland Kitcheneprazole (PRILOSEC) 20 MG capsule Take 20 mg by mouth daily before supper.    . oMarland Kitchenybutynin (DITROPAN XL) 15 MG 24 hr tablet Take 15 mg by mouth daily.    . prazosin (MINIPRESS) 5 MG capsule Take 5 mg by mouth at bedtime.  . vitamin E 400 UNIT capsule Take 400 Units by mouth daily.     No facility-administered encounter medications on file as of 09/17/2017.     Review of Systems:  Review of Systems  Constitutional: Positive for weight loss. Negative for chills, fever and malaise/fatigue.       But weight now up 2 lbs since beginning tx for esophagitis  HENT: Negative for congestion and sore throat.   Eyes:       Glasses  Respiratory: Negative for  shortness of breath.   Cardiovascular: Negative for chest pain, palpitations and leg swelling.  Gastrointestinal: Positive for diarrhea. Negative for abdominal pain, blood in stool, constipation, heartburn, nausea and vomiting.       Better after beginning diflucan therapy  Genitourinary: Negative for dysuria.  Musculoskeletal: Negative for falls and joint pain.  Neurological: Negative for dizziness and loss of consciousness.  Psychiatric/Behavioral:  Negative for memory loss.       Bipolar in remission    Health Maintenance  Topic Date Due  . Hepatitis C Screening  1946-02-02  . INFLUENZA VACCINE  09/24/2017  . TETANUS/TDAP  05/25/2021  . COLONOSCOPY  06/20/2025  . PNA vac Low Risk Adult  Completed    Physical Exam: Vitals:   09/17/17 1500  BP: 110/60  Pulse: 81  Temp: 98.2 F (36.8 C)  TempSrc: Oral  SpO2: 97%  Weight: 159 lb (72.1 kg)  Height: 6' 4"  (1.93 m)   Body mass index is 19.35 kg/m. Physical Exam  Constitutional: He is oriented to person, place, and time.  Cachectic tall male  HENT:  Some scattered erythematous patches remain on buccal mucosa but no white on tongue or throat (I did not seen any though last visit either)  Cardiovascular: Normal rate, regular rhythm, normal heart sounds and intact distal pulses.  Pulmonary/Chest: Effort normal and breath sounds normal. No respiratory distress.  Abdominal: Soft. Bowel sounds are normal. He exhibits no distension. There is no tenderness.  Musculoskeletal: Normal range of motion.  Neurological: He is alert and oriented to person, place, and time.  Skin: Skin is warm and dry. Capillary refill takes less than 2 seconds.  Psychiatric:  Chronic pressured speech, very polite    Labs reviewed: Basic Metabolic Panel: Recent Labs    05/04/17 0903 05/31/17 1203 06/11/17 06/18/17 1214  NA 141 140 139 141  K 4.5 4.1 3.5 3.5  CL 106 108  --  111  CO2 28 24  --  23  GLUCOSE 116* 83  --  103*  BUN 24 18 18 14     CREATININE 2.00* 1.77* 1.8* 1.69*  CALCIUM 9.1 9.0  --  8.5*   Liver Function Tests: Recent Labs    05/04/17 0903 05/31/17 1203 06/11/17 06/18/17 1214  AST 10 13* 9* 14*  ALT 5* 7* 6* 8*  ALKPHOS  --  53 55 46  BILITOT 0.3 0.5  --  0.5  PROT 6.9 7.1  --  6.9  ALBUMIN  --  3.2*  --  3.0*   Recent Labs    06/18/17 1214  LIPASE 31   No results for input(s): AMMONIA in the last 8760 hours. CBC: Recent Labs    05/04/17 0903  05/31/17 1203 06/11/17 06/18/17 1214 06/29/17 1212 07/30/17 0958 08/31/17 0908  WBC 8.6  --  9.5 8.3 8.8  --   --   --   NEUTROABS 4,773  --  6.2  --   --   --   --   --   HGB 11.3*   < > 11.7* 10.6* 10.0* 10.2* 10.4* 10.3*  HCT 34.3*  --  37.6* 33* 31.7*  --   --   --   MCV 98.6  --  103.6*  --  102.6*  --   --   --   PLT 180  --  190 171 187  --   --   --    < > = values in this interval not displayed.   Lipid Panel: Recent Labs    05/04/17 0903  CHOL 138  HDL 42  LDLCALC 71  TRIG 176*  CHOLHDL 3.3   Lab Results  Component Value Date   HGBA1C 4.9 08/10/2015    Procedures since last visit: Xr Shoulder Left  Result Date: 08/26/2017 3 views left shoulder: No subluxation dislocation.  Glenohumeral joint is well-maintained.  No significant arthritic changes the  glenohumeral joint.  Has moderate AC joint changes.  Y-view shows the subacromial space to be well-maintained.   Assessment/Plan 1. Weight loss, unintentional -seems that #2 was cause, but does have f/u egd to confirm when infection cleared - fluconazole (DIFLUCAN) 150 MG tablet; 2 tabs first two days, then daily for 2 weeks total  Dispense: 20 tablet; Refill: 0  2. Candida esophagitis (HCC) -complete abx course as per GI at [facility] - fluconazole (DIFLUCAN) 150 MG tablet; 2 tabs first two days, then daily for 2 weeks total  Dispense: 20 tablet; Refill: 0  3. Odynophagia -resolved with newly started treatment, already eating better  4. Chronic kidney disease (CKD), stage III  (moderate) (HCC) -progressive, Avoid nephrotoxic agents like nsaids, dose adjust renally excreted meds, hydrate, followed by nephrologist  5. Bipolar 1 disorder (HCC) -stable, is taking meds and tolerating well  6. Crohn's disease of small intestine with rectal bleeding ([place]) -cont remicade and monitor  Labs/tests ordered:   Orders Placed This Encounter  Procedures  . HM COLONOSCOPY    This external order was created through the Results Console.  EGD and cscope copies sent to scan  Next appt:  01/13/2018   Bryauna Byrum L. Ailana Cuadrado, D.O. [facility] [address], [place] [address] Cell Phone (Mon-Fri 8am-5pm):  [phone] On Call:  [phone] & follow prompts after 5pm & weekends Office Phone:  [phone] Office Fax:  [phone]

## 2017-09-17 NOTE — Patient Instructions (Addendum)
Ask the [facility] about whether you had hepatitis C screening there. Also ask them about your shingrix vaccine series.

## 2017-09-22 DIAGNOSIS — B3781 Candidal esophagitis: Secondary | ICD-10-CM | POA: Insufficient documentation

## 2017-09-28 ENCOUNTER — Encounter (HOSPITAL_COMMUNITY): Payer: Self-pay

## 2017-09-28 ENCOUNTER — Encounter (HOSPITAL_COMMUNITY)
Admission: RE | Admit: 2017-09-28 | Discharge: 2017-09-28 | Disposition: A | Discharge status: Home / Self Care | Payer: Medicare Other | Source: Ambulatory Visit | Attending: Gastroenterology | Admitting: Gastroenterology

## 2017-09-28 DIAGNOSIS — D6489 Other specified anemias: Secondary | ICD-10-CM | POA: Diagnosis not present

## 2017-09-28 DIAGNOSIS — N183 Chronic kidney disease, stage 3 (moderate): Secondary | ICD-10-CM | POA: Insufficient documentation

## 2017-09-28 LAB — HEMOGLOBIN: Hemoglobin: 10.8 g/dL — ABNORMAL LOW (ref 13.0–17.0)

## 2017-09-28 LAB — IRON AND TIBC
Iron: 38 ug/dL — ABNORMAL LOW (ref 45–182)
SATURATION RATIOS: 16 % — AB (ref 17.9–39.5)
TIBC: 233 ug/dL — AB (ref 250–450)
UIBC: 195 ug/dL

## 2017-09-28 LAB — FERRITIN: Ferritin: 833 ng/mL — ABNORMAL HIGH (ref 24–336)

## 2017-09-28 MED ORDER — EPOETIN ALFA 10000 UNIT/ML IJ SOLN
10000.0000 [IU] | INTRAMUSCULAR | Status: DC
  Administered 2017-09-28: 10000 [IU] via SUBCUTANEOUS
  Filled 2017-09-28: qty 1

## 2017-10-02 DIAGNOSIS — I739 Peripheral vascular disease, unspecified: Secondary | ICD-10-CM | POA: Diagnosis not present

## 2017-10-02 DIAGNOSIS — L84 Corns and callosities: Secondary | ICD-10-CM | POA: Diagnosis not present

## 2017-10-02 DIAGNOSIS — B351 Tinea unguium: Secondary | ICD-10-CM | POA: Diagnosis not present

## 2017-10-28 ENCOUNTER — Encounter (HOSPITAL_COMMUNITY)
Admission: RE | Admit: 2017-10-28 | Discharge: 2017-10-28 | Disposition: A | Discharge status: Home / Self Care | Payer: Medicare Other | Source: Ambulatory Visit | Attending: Gastroenterology | Admitting: Gastroenterology

## 2017-10-28 ENCOUNTER — Encounter (HOSPITAL_COMMUNITY): Payer: Self-pay

## 2017-10-28 DIAGNOSIS — N183 Chronic kidney disease, stage 3 (moderate): Secondary | ICD-10-CM | POA: Diagnosis not present

## 2017-10-28 DIAGNOSIS — D6489 Other specified anemias: Secondary | ICD-10-CM | POA: Insufficient documentation

## 2017-10-28 LAB — IRON AND TIBC
Iron: 70 ug/dL (ref 45–182)
Saturation Ratios: 29 % (ref 17.9–39.5)
TIBC: 240 ug/dL — ABNORMAL LOW (ref 250–450)
UIBC: 170 ug/dL

## 2017-10-28 LAB — HEMOGLOBIN: Hemoglobin: 10.9 g/dL — ABNORMAL LOW (ref 13.0–17.0)

## 2017-10-28 LAB — FERRITIN: Ferritin: 863 ng/mL — ABNORMAL HIGH (ref 24–336)

## 2017-10-28 MED ORDER — EPOETIN ALFA 10000 UNIT/ML IJ SOLN
10000.0000 [IU] | INTRAMUSCULAR | Status: DC
  Administered 2017-10-28: 10000 [IU] via SUBCUTANEOUS
  Filled 2017-10-28: qty 1

## 2017-11-25 ENCOUNTER — Encounter (HOSPITAL_COMMUNITY): Payer: Medicare Other | Attending: Gastroenterology

## 2017-11-25 DIAGNOSIS — N183 Chronic kidney disease, stage 3 (moderate): Secondary | ICD-10-CM | POA: Insufficient documentation

## 2017-11-25 DIAGNOSIS — D6489 Other specified anemias: Secondary | ICD-10-CM | POA: Insufficient documentation

## 2017-12-15 DIAGNOSIS — D631 Anemia in chronic kidney disease: Secondary | ICD-10-CM | POA: Diagnosis not present

## 2017-12-15 DIAGNOSIS — N2581 Secondary hyperparathyroidism of renal origin: Secondary | ICD-10-CM | POA: Diagnosis not present

## 2017-12-15 DIAGNOSIS — N183 Chronic kidney disease, stage 3 (moderate): Secondary | ICD-10-CM | POA: Diagnosis not present

## 2017-12-15 DIAGNOSIS — I129 Hypertensive chronic kidney disease with stage 1 through stage 4 chronic kidney disease, or unspecified chronic kidney disease: Secondary | ICD-10-CM | POA: Diagnosis not present

## 2017-12-30 ENCOUNTER — Encounter (HOSPITAL_COMMUNITY): Payer: Self-pay

## 2017-12-30 ENCOUNTER — Encounter (HOSPITAL_COMMUNITY)
Admission: RE | Admit: 2017-12-30 | Discharge: 2017-12-30 | Disposition: A | Discharge status: Home / Self Care | Payer: Medicare Other | Source: Ambulatory Visit | Attending: Gastroenterology | Admitting: Gastroenterology

## 2017-12-30 DIAGNOSIS — D6489 Other specified anemias: Secondary | ICD-10-CM | POA: Insufficient documentation

## 2017-12-30 DIAGNOSIS — N183 Chronic kidney disease, stage 3 (moderate): Secondary | ICD-10-CM | POA: Insufficient documentation

## 2017-12-30 LAB — IRON AND TIBC
Iron: 47 ug/dL (ref 45–182)
Saturation Ratios: 21 % (ref 17.9–39.5)
TIBC: 220 ug/dL — ABNORMAL LOW (ref 250–450)
UIBC: 173 ug/dL

## 2017-12-30 LAB — HEMOGLOBIN: HEMOGLOBIN: 9.1 g/dL — AB (ref 13.0–17.0)

## 2017-12-30 LAB — FERRITIN: FERRITIN: 712 ng/mL — AB (ref 24–336)

## 2017-12-30 MED ORDER — EPOETIN ALFA 10000 UNIT/ML IJ SOLN
INTRAMUSCULAR | Status: AC
  Administered 2017-12-30: 10000 [IU]
  Filled 2017-12-30: qty 1

## 2017-12-30 MED ORDER — EPOETIN ALFA 20000 UNIT/ML IJ SOLN
10000.0000 [IU] | INTRAMUSCULAR | Status: DC
  Filled 2017-12-30: qty 1

## 2018-01-01 DIAGNOSIS — I739 Peripheral vascular disease, unspecified: Secondary | ICD-10-CM | POA: Diagnosis not present

## 2018-01-01 DIAGNOSIS — L84 Corns and callosities: Secondary | ICD-10-CM | POA: Diagnosis not present

## 2018-01-01 DIAGNOSIS — B351 Tinea unguium: Secondary | ICD-10-CM | POA: Diagnosis not present

## 2018-01-13 ENCOUNTER — Ambulatory Visit (INDEPENDENT_AMBULATORY_CARE_PROVIDER_SITE_OTHER): Payer: Medicare Other

## 2018-01-13 VITALS — BP 140/78 | HR 98 | Temp 97.9°F | Ht 76.0 in | Wt 157.0 lb

## 2018-01-13 DIAGNOSIS — Z0000 Encounter for general adult medical examination without abnormal findings: Secondary | ICD-10-CM

## 2018-01-13 NOTE — Patient Instructions (Signed)
[name] , Thank you for taking time to come for your Medicare Wellness Visit. I appreciate your ongoing commitment to your health goals. Please review the following plan we discussed and let me know if I can assist you in the future.   Screening recommendations/referrals: Colonoscopy up to date Recommended yearly ophthalmology/optometry visit for glaucoma screening and checkup Recommended yearly dental visit for hygiene and checkup  Vaccinations: Influenza vaccine up to date Pneumococcal vaccine up to date, completed Tdap vaccine up to date, due 05/25/2021 Shingles vaccine due, please get at [facility] if possible. It's called Shingrix and it is 2 shots    Advanced directives: Please bring [name] a copy of your living will and health care power of attorney  Conditions/risks identified: none  Next appointment: Dr. Mariea Clonts 01/28/2018 @ 3pm           Tyson Dense, RN 01/27/2019 @ 1:15pm  Preventive Care 31 Years and Older, Male Preventive care refers to lifestyle choices and visits with your health care provider that can promote health and wellness. What does preventive care include?  A yearly physical exam. This is also called an annual well check.  Dental exams once or twice a year.  Routine eye exams. Ask your health care provider how often you should have your eyes checked.  Personal lifestyle choices, including:  Daily care of your teeth and gums.  Regular physical activity.  Eating a healthy diet.  Avoiding tobacco and drug use.  Limiting alcohol use.  Practicing safe sex.  Taking low doses of aspirin every day.  Taking vitamin and mineral supplements as recommended by your health care provider. What happens during an annual well check? The services and screenings done by your health care provider during your annual well check will depend on your age, overall health, lifestyle risk factors, and family history of disease. Counseling  Your health care provider may ask you questions  about your:  Alcohol use.  Tobacco use.  Drug use.  Emotional well-being.  Home and relationship well-being.  Sexual activity.  Eating habits.  History of falls.  Memory and ability to understand (cognition).  Work and work Statistician. Screening  You may have the following tests or measurements:  Height, weight, and BMI.  Blood pressure.  Lipid and cholesterol levels. These may be checked every 5 years, or more frequently if you are over 60 years old.  Skin check.  Lung cancer screening. You may have this screening every year starting at age 67 if you have a 30-pack-year history of smoking and currently smoke or have quit within the past 15 years.  Fecal occult blood test (FOBT) of the stool. You may have this test every year starting at age 33.  Flexible sigmoidoscopy or colonoscopy. You may have a sigmoidoscopy every 5 years or a colonoscopy every 10 years starting at age 63.  Prostate cancer screening. Recommendations will vary depending on your family history and other risks.  Hepatitis C blood test.  Hepatitis B blood test.  Sexually transmitted disease (STD) testing.  Diabetes screening. This is done by checking your blood sugar (glucose) after you have not eaten for a while (fasting). You may have this done every 1-3 years.  Abdominal aortic aneurysm (AAA) screening. You may need this if you are a current or former smoker.  Osteoporosis. You may be screened starting at age 68 if you are at high risk. Talk with your health care provider about your test results, treatment options, and if necessary, the need  for more tests. Vaccines  Your health care provider may recommend certain vaccines, such as:  Influenza vaccine. This is recommended every year.  Tetanus, diphtheria, and acellular pertussis (Tdap, Td) vaccine. You may need a Td booster every 10 years.  Zoster vaccine. You may need this after age 45.  Pneumococcal 13-valent conjugate (PCV13)  vaccine. One dose is recommended after age 31.  Pneumococcal polysaccharide (PPSV23) vaccine. One dose is recommended after age 87. Talk to your health care provider about which screenings and vaccines you need and how often you need them. This information is not intended to replace advice given to you by your health care provider. Make sure you discuss any questions you have with your health care provider. Document Released: 03/09/2015 Document Revised: 10/31/2015 Document Reviewed: 12/12/2014 Elsevier Interactive Patient Education  2017 [place] Prevention in the Home Falls can cause injuries. They can happen to people of all ages. There are many things you can do to make your home safe and to help prevent falls. What can I do on the outside of my home?  Regularly fix the edges of walkways and driveways and fix any cracks.  Remove anything that might make you trip as you walk through a door, such as a raised step or threshold.  Trim any bushes or trees on the path to your home.  Use bright outdoor lighting.  Clear any walking paths of anything that might make someone trip, such as rocks or tools.  Regularly check to see if handrails are loose or broken. Make sure that both sides of any steps have handrails.  Any raised decks and porches should have guardrails on the edges.  Have any leaves, snow, or ice cleared regularly.  Use sand or salt on walking paths during winter.  Clean up any spills in your garage right away. This includes oil or grease spills. What can I do in the bathroom?  Use night lights.  Install grab bars by the toilet and in the tub and shower. Do not use towel bars as grab bars.  Use non-skid mats or decals in the tub or shower.  If you need to sit down in the shower, use a plastic, non-slip stool.  Keep the floor dry. Clean up any water that spills on the floor as soon as it happens.  Remove soap buildup in the tub or shower  regularly.  Attach bath mats securely with double-sided non-slip rug tape.  Do not have throw rugs and other things on the floor that can make you trip. What can I do in the bedroom?  Use night lights.  Make sure that you have a light by your bed that is easy to reach.  Do not use any sheets or blankets that are too big for your bed. They should not hang down onto the floor.  Have a firm chair that has side arms. You can use this for support while you get dressed.  Do not have throw rugs and other things on the floor that can make you trip. What can I do in the kitchen?  Clean up any spills right away.  Avoid walking on wet floors.  Keep items that you use a lot in easy-to-reach places.  If you need to reach something above you, use a strong step stool that has a grab bar.  Keep electrical cords out of the way.  Do not use floor polish or wax that makes floors slippery. If you must use wax, use  non-skid floor wax.  Do not have throw rugs and other things on the floor that can make you trip. What can I do with my stairs?  Do not leave any items on the stairs.  Make sure that there are handrails on both sides of the stairs and use them. Fix handrails that are broken or loose. Make sure that handrails are as long as the stairways.  Check any carpeting to make sure that it is firmly attached to the stairs. Fix any carpet that is loose or worn.  Avoid having throw rugs at the top or bottom of the stairs. If you do have throw rugs, attach them to the floor with carpet tape.  Make sure that you have a light switch at the top of the stairs and the bottom of the stairs. If you do not have them, ask someone to add them for you. What else can I do to help prevent falls?  Wear shoes that:  Do not have high heels.  Have rubber bottoms.  Are comfortable and fit you well.  Are closed at the toe. Do not wear sandals.  If you use a stepladder:  Make sure that it is fully  opened. Do not climb a closed stepladder.  Make sure that both sides of the stepladder are locked into place.  Ask someone to hold it for you, if possible.  Clearly mark and make sure that you can see:  Any grab bars or handrails.  First and last steps.  Where the edge of each step is.  Use tools that help you move around (mobility aids) if they are needed. These include:  Canes.  Walkers.  Scooters.  Crutches.  Turn on the lights when you go into a dark area. Replace any light bulbs as soon as they burn out.  Set up your furniture so you have a clear path. Avoid moving your furniture around.  If any of your floors are uneven, fix them.  If there are any pets around you, be aware of where they are.  Review your medicines with your doctor. Some medicines can make you feel dizzy. This can increase your chance of falling. Ask your doctor what other things that you can do to help prevent falls. This information is not intended to replace advice given to you by your health care provider. Make sure you discuss any questions you have with your health care provider. Document Released: 12/07/2008 Document Revised: 07/19/2015 Document Reviewed: 03/17/2014 Elsevier Interactive Patient Education  2017 [employer].

## 2018-01-13 NOTE — Progress Notes (Signed)
Subjective:   Brent [name] is a 72 y.o. male who presents for Medicare Annual/Subsequent preventive examination.  Last AWV-01/05/2017    Objective:    Vitals: BP 140/78 (BP Location: Left Arm, Patient Position: Sitting)   Pulse 98   Temp 97.9 F (36.6 C) (Oral)   Ht 6' 4"  (1.93 m)   Wt 157 lb (71.2 kg)   SpO2 98%   BMI 19.11 kg/m   Body mass index is 19.11 kg/m.  Advanced Directives 01/13/2018 09/17/2017 06/22/2017 01/05/2017 10/06/2016 03/11/2016 02/14/2016  Does Patient Have a Medical Advance Directive? Yes No No Yes No Yes No  Type of Paramedic of [place];Living will - - [place];Living will - [place];Living will -  Does patient want to make changes to medical advance directive? No - Patient declined - - No - Patient declined - - No - Patient declined  Copy of [place] in Chart? No - copy requested - - No - copy requested - Yes -  Would patient like information on creating a medical advance directive? - No - Patient declined No - Patient declined - No - Patient declined - -  Pre-existing out of facility DNR order (yellow form or pink MOST form) - - - - - - -    Tobacco Social History   Tobacco Use  Smoking Status Former Smoker  . Packs/day: 1.00  . Years: 49.00  . Pack years: 49.00  . Types: Cigarettes  . Start date: 04/11/1956  . Last attempt to quit: 04/17/2014  . Years since quitting: 3.7  Smokeless Tobacco Never Used     Counseling given: Not Answered   Clinical Intake:  Pre-visit preparation completed: No  Pain : No/denies pain     Diabetes: No  How often do you need to have someone help you when you read instructions, pamphlets, or other written materials from your doctor or pharmacy?: 1 - Never What is the last grade level you completed in school?: HS  Interpreter Needed?: No  Information entered by :: Tyson Dense, RN  Past Medical History:  Diagnosis Date  .  Abdominal pain, right lower quadrant   . Abdominal pain, unspecified site   . Acute cholecystitis   . Anemia   . Anemia, unspecified   . Anxiety   . Benign paroxysmal positional vertigo   . Bipolar I disorder, most recent episode (or current) unspecified   . Celiac disease   . Cellulitis and abscess of unspecified site   . Chronic kidney disease, stage III (moderate) (HCC)   . Cough   . Crohn's   . Depression   . Diarrhea   . Dizziness and giddiness   . Dizziness and giddiness   . Dizziness and giddiness   . Dysuria   . Edema   . Encounter for long-term (current) use of other medications   . Essential and other specified forms of tremor   . Essential hypertension, benign   . Gout 2018  . Hypertensive renal disease, benign   . Hypertrophy of prostate without urinary obstruction and other lower urinary tract symptoms (LUTS)   . Hypopotassemia   . Impacted cerumen   . Impotence of organic origin   . Insomnia with sleep apnea, unspecified   . Insomnia with sleep apnea, unspecified   . Iron deficiency anemia, unspecified   . Loss of weight   . Malnutrition of mild degree ([place])   . 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  Nausea with vomiting   . Neuralgia, neuritis, and radiculitis, unspecified   . Other abnormal blood chemistry   . Other B-complex deficiencies   . Other B-complex deficiencies   . Other extrapyramidal disease and abnormal movement disorder   . Other extrapyramidal disease and abnormal movement disorder   . Other malaise and fatigue   . Other specified disease of white blood cells   . Other specified disease of white blood cells   . Pain in joint, lower leg   . Pain in limb   . Postinflammatory pulmonary fibrosis ([place])   . Regional enteritis of small intestine ([place])   . Regional enteritis of small intestine with large intestine ([place])   . Renal insufficiency   . Tobacco use disorder   . Unspecified disorder of skin and subcutaneous tissue   . Vertigo 2018  . Viral  warts, unspecified   . Viremia, unspecified    Past Surgical History:  Procedure Laterality Date  . CHOLECYSTECTOMY  07-12-2010  . SMALL INTESTINE SURGERY     x 2   Family History  Problem Relation Age of Onset  . Diabetes Mother        maternal grandmother  . Uterine cancer Mother   . Emphysema Father   . Pneumonia Maternal Grandmother   . Colon cancer Neg Hx    Social History   Socioeconomic History  . Marital status: Divorced    Spouse name: Not on file  . Number of children: 1  . Years of education: Not on file  . Highest education level: Not on file  Occupational History  . Occupation: retired  . Occupation: [employer]  . Financial resource strain: Not hard at all  . Food insecurity:    Worry: Never true    Inability: Never true  . Transportation needs:    Medical: No    Non-medical: No  Tobacco Use  . Smoking status: Former Smoker    Packs/day: 1.00    Years: 49.00    Pack years: 49.00    Types: Cigarettes    Start date: 04/11/1956    Last attempt to quit: 04/17/2014    Years since quitting: 3.7  . Smokeless tobacco: Never Used  Substance and Sexual Activity  . Alcohol use: No    Alcohol/week: 0.0 standard drinks  . Drug use: No  . Sexual activity: Never  Lifestyle  . Physical activity:    Days per week: 0 days    Minutes per session: 0 min  . Stress: Not at all  Relationships  . Social connections:    Talks on phone: More than three times a week    Gets together: Three times a week    Attends religious service: More than 4 times per year    Active member of club or organization: Yes    Attends meetings of clubs or organizations: 1 to 4 times per year    Relationship status: Divorced  Other Topics Concern  . Not on file  Social History Narrative  . Not on file    Outpatient Encounter Medications as of 01/13/2018  Medication Sig  . allopurinol (ZYLOPRIM) 100 MG tablet Take 2 tablets (200 mg total) by mouth daily.  Marland Kitchen amLODipine  (NORVASC) 2.5 MG tablet Take 2.5 mg by mouth daily.  . Calcium Carbonate-Vit D-Min 600-400 MG-UNIT TABS Take 1 tablet by mouth 3 (three) times daily.   . cholecalciferol (VITAMIN D) 1000 units tablet 2 tablets once a day   .  divalproex (DEPAKOTE) 500 MG EC tablet Take 1,500 mg by mouth daily.   Marland Kitchen epoetin alfa (PROCRIT) 81017 UNIT/ML injection Inject 10,000 Units into the skin every 30 (thirty) days.   . ferrous sulfate 325 (65 FE) MG tablet Take 325 mg by mouth 2 (two) times daily.    . fluconazole (DIFLUCAN) 150 MG tablet 2 tabs first two days, then daily for 2 weeks total  . inFLIXimab (REMICADE) 100 MG injection Infuse Remicade IV schedule 1 87m/kg every 8 weeks Premedicate with Tylenol 500-6530mby mouth and Benadryl 25-5030my mouth prior to infusion. Last PPD was on 12/2009.   . Loperamide HCl (LOPERAMIDE A-D PO) Take 1 capsule by mouth as needed (loose stools).   . magnesium oxide (MAG-OX) 400 (241.3 Mg) MG tablet Take 2,400 mg by mouth 3 (three) times daily.   . meclizine (ANTIVERT) 25 MG tablet Take 25 mg by mouth 3 (three) times daily as needed.   . metoprolol succinate (TOPROL-XL) 50 MG 24 hr tablet Take 50 mg by mouth daily. Take with or immediately following a meal.  . Multiple Vitamin (MULTIVITAMIN) tablet Take 1 tablet by mouth daily.    . OMarland KitchenANZapine (ZYPREXA) 7.5 MG tablet Take 1 tablet (7.5 mg total) by mouth at bedtime.  . oMarland Kitcheneprazole (PRILOSEC) 20 MG capsule Take 20 mg by mouth daily before supper.    . oMarland Kitchenybutynin (DITROPAN XL) 15 MG 24 hr tablet Take 15 mg by mouth daily.    . prazosin (MINIPRESS) 5 MG capsule Take 5 mg by mouth at bedtime.  . vitamin E 400 UNIT capsule Take 400 Units by mouth daily.     No facility-administered encounter medications on file as of 01/13/2018.     Activities of Daily Living In your present state of health, do you have any difficulty performing the following activities: 01/13/2018 12/30/2017  Hearing? N N  Vision? N N  Difficulty  concentrating or making decisions? N N  Walking or climbing stairs? N N  Dressing or bathing? N N  Doing errands, shopping? N -  Preparing Food and eating ? N -  Using the Toilet? N -  In the past six months, have you accidently leaked urine? N -  Do you have problems with loss of bowel control? N -  Managing your Medications? N -  Managing your Finances? N -  Housekeeping or managing your Housekeeping? N -  Some recent data might be hidden    Patient Care Team: ReeGayland CurryO as PCP - General (Geriatric Medicine) PatElmarie ShileyD (Nephrology) JacMilus BanisterD (Gastroenterology)   Assessment:   This is a routine wellness examination for JamPeteExercise Activities and Dietary recommendations Current Exercise Habits: The patient does not participate in regular exercise at present, Exercise limited by: None identified  Goals    . Increase water intake     Starting 01/03/16, I will attempt to increase my water intake to 8 glasses per day.     . Increase water intake     Increase water to 2-3 glasses a day    . Increase water intake     Patient will increase water intake to 2-3 glasses/day       Fall Risk Fall Risk  01/13/2018 09/17/2017 08/17/2017 07/27/2017 06/22/2017  Falls in the past year? 0 No No No No  Number falls in past yr: 0 - - - -  Injury with Fall? 0 - - - -   Is the patient's home free  of loose throw rugs in walkways, pet beds, electrical cords, etc?   yes      Grab bars in the bathroom? yes      Handrails on the stairs?   yes      Adequate lighting?   yes  Depression Screen PHQ 2/9 Scores 01/13/2018 09/17/2017 08/17/2017 07/27/2017  PHQ - 2 Score 0 0 0 0    Cognitive Function MMSE - Mini Mental State Exam 01/13/2018 01/05/2017 01/03/2016  Orientation to time 5 5 5   Orientation to Place 5 5 5   Registration 3 3 3   Attention/ Calculation 3 3 4   Recall 0 0 3  Language- name 2 objects 2 2 2   Language- repeat 1 1 1   Language- follow 3 step command 3 3 2    Language- read & follow direction 1 1 1   Write a sentence 1 1 1   Copy design 1 1 1   Total score 25 25 28         Immunization History  Administered Date(s) Administered  . Influenza, High Dose Seasonal PF 11/26/2016  . Influenza,inj,Quad PF,6+ Mos 11/13/2014, 01/03/2016  . Influenza-Unspecified 10/25/2012, 11/24/2016, 11/24/2017  . PPD Test 12/30/2010, 01/05/2012, 01/07/2013, 01/13/2014  . Pneumococcal Conjugate-13 08/10/2014  . Pneumococcal Polysaccharide-23 03/05/2004, 01/03/2016  . Tdap 05/26/2011    Qualifies for Shingles Vaccine? Due, patient will get at [facility]  Topic Date Due  . Hepatitis C Screening  [birth year]  . TETANUS/TDAP  05/25/2021  . COLONOSCOPY  09/17/2027  . INFLUENZA VACCINE  Completed  . PNA vac Low Risk Adult  Completed   Cancer Screenings: Lung: Low Dose CT Chest recommended if Age 109-80 years, 30 pack-year currently smoking OR have quit w/in 15years. Patient does not qualify. Colorectal: up to date  Additional Screenings:  Hepatitis C Screening:declined      Plan:    I have personally reviewed and addressed the Medicare Annual Wellness questionnaire and have noted the following in the patient's chart:  A. Medical and social history B. Use of alcohol, tobacco or illicit drugs  C. Current medications and supplements D. Functional ability and status E.  Nutritional status F.  Physical activity G. Advance directives H. List of other physicians I.  Hospitalizations, surgeries, and ER visits in previous 12 months J.  [place] to include hearing, vision, cognitive, depression L. Referrals and appointments - none  In addition, I have reviewed and discussed with patient certain preventive protocols, quality metrics, and best practice recommendations. A written personalized care plan for preventive services as well as general preventive health recommendations were provided to patient.  See attached scanned  questionnaire for additional information.   Signed,   Tyson Dense, RN Nurse Health Advisor  Patient Concerns: none

## 2018-01-27 ENCOUNTER — Encounter (HOSPITAL_COMMUNITY)
Admission: RE | Admit: 2018-01-27 | Discharge: 2018-01-27 | Disposition: A | Discharge status: Home / Self Care | Payer: Medicare Other | Source: Ambulatory Visit | Attending: Gastroenterology | Admitting: Gastroenterology

## 2018-01-27 ENCOUNTER — Encounter (HOSPITAL_COMMUNITY): Payer: Self-pay

## 2018-01-27 DIAGNOSIS — D6489 Other specified anemias: Secondary | ICD-10-CM | POA: Insufficient documentation

## 2018-01-27 DIAGNOSIS — N183 Chronic kidney disease, stage 3 (moderate): Secondary | ICD-10-CM | POA: Insufficient documentation

## 2018-01-27 LAB — IRON AND TIBC
Iron: 48 ug/dL (ref 45–182)
SATURATION RATIOS: 22 % (ref 17.9–39.5)
TIBC: 219 ug/dL — AB (ref 250–450)
UIBC: 171 ug/dL

## 2018-01-27 LAB — HEMOGLOBIN: Hemoglobin: 10.6 g/dL — ABNORMAL LOW (ref 13.0–17.0)

## 2018-01-27 LAB — FERRITIN: FERRITIN: 724 ng/mL — AB (ref 24–336)

## 2018-01-27 MED ORDER — EPOETIN ALFA 10000 UNIT/ML IJ SOLN
10000.0000 [IU] | INTRAMUSCULAR | Status: DC
  Administered 2018-01-27: 10000 [IU] via SUBCUTANEOUS
  Filled 2018-01-27: qty 1

## 2018-01-27 MED ORDER — EPOETIN ALFA 20000 UNIT/ML IJ SOLN
10000.0000 [IU] | INTRAMUSCULAR | Status: DC
  Filled 2018-01-27: qty 1

## 2018-01-28 ENCOUNTER — Encounter: Payer: Self-pay | Admitting: Internal Medicine

## 2018-01-28 ENCOUNTER — Ambulatory Visit (INDEPENDENT_AMBULATORY_CARE_PROVIDER_SITE_OTHER): Payer: Medicare Other | Admitting: Internal Medicine

## 2018-01-28 VITALS — BP 120/60 | HR 77 | Temp 98.0°F | Ht 76.0 in | Wt 159.0 lb

## 2018-01-28 DIAGNOSIS — D631 Anemia in chronic kidney disease: Secondary | ICD-10-CM

## 2018-01-28 DIAGNOSIS — R350 Frequency of micturition: Secondary | ICD-10-CM

## 2018-01-28 DIAGNOSIS — N401 Enlarged prostate with lower urinary tract symptoms: Secondary | ICD-10-CM

## 2018-01-28 DIAGNOSIS — B3781 Candidal esophagitis: Secondary | ICD-10-CM | POA: Diagnosis not present

## 2018-01-28 DIAGNOSIS — K50011 Crohn's disease of small intestine with rectal bleeding: Secondary | ICD-10-CM | POA: Diagnosis not present

## 2018-01-28 DIAGNOSIS — F319 Bipolar disorder, unspecified: Secondary | ICD-10-CM

## 2018-01-28 DIAGNOSIS — N183 Chronic kidney disease, stage 3 unspecified: Secondary | ICD-10-CM

## 2018-01-28 DIAGNOSIS — I10 Essential (primary) hypertension: Secondary | ICD-10-CM

## 2018-01-28 NOTE — Patient Instructions (Signed)
Please remember to ask about the shingles vaccine at the [place].

## 2018-01-28 NOTE — Progress Notes (Signed)
[name] Provider:  Albin Duckett L. Mariea Clonts, D.O., C.M.D.  Goals of Care:  Advanced Directives 01/13/2018  Does Patient Have a Medical Advance Directive? Yes  Type of Paramedic of [place];Living will  Does patient want to make changes to medical advance directive? No - Patient declined  Copy of [place] in Chart? No - copy requested  Would patient like information on creating a medical advance directive? -  Pre-existing out of facility DNR order (yellow form or pink MOST form) -   Chief Complaint  Patient presents with  . Medical Management of Chronic Issues    [facility] follow-up    HPI: Patient is a 72y.o. male seen today for medical management of chronic diseases.    He had a bad loose stool day and he had to run behind a building and have diarrhea.  There was a towel there so fortunately he was able to clean up with that!  It lasted him a day and he wound up taking 4 loperamide instead of his usual dosing and he "ODd" on it and got constipated.  Bowels are now back in order.    He's going to get his injections of remicade at home now.    Mood is good.  He is sleeping well at night.    Appetite is better.  He's eating no problem.  No sore throat or esophagus anymore.    Still has frequent urination and is interested in trying myrbetriq again if it's affordable in the new year.  No pain.    BP is great.    He's still getting his procrit shot.  His hgb was down to 9.  He never can get up to a hgb of 12.  Weight has stabilized at 159 lbs.    No shortness of breath.  Postinflammatory fibrosis seems to have been short term.  Past Medical History:  Diagnosis Date  . Abdominal pain, right lower quadrant   . Abdominal pain, unspecified site   . Acute cholecystitis   . Anemia   . Anemia, unspecified   . Anxiety   . Benign paroxysmal positional vertigo   . Bipolar I disorder, most recent episode (or current) unspecified   .  Celiac disease   . Cellulitis and abscess of unspecified site   . Chronic kidney disease, stage III (moderate) (HCC)   . Cough   . Crohn's   . Depression   . Diarrhea   . Dizziness and giddiness   . Dizziness and giddiness   . Dizziness and giddiness   . Dysuria   . Edema   . Encounter for long-term (current) use of other medications   . Essential and other specified forms of tremor   . Essential hypertension, benign   . Gout 2018  . Hypertensive renal disease, benign   . Hypertrophy of prostate without urinary obstruction and other lower urinary tract symptoms (LUTS)   . Hypopotassemia   . Impacted cerumen   . Impotence of organic origin   . Insomnia with sleep apnea, unspecified   . Insomnia with sleep apnea, unspecified   . Iron deficiency anemia, unspecified   . Loss of weight   . Malnutrition of mild degree ([place]   . 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 Nausea with vomiting   . Neuralgia, neuritis, and radiculitis, unspecified   . Other abnormal blood chemistry   . Other B-complex deficiencies   . Other B-complex deficiencies   . Other extrapyramidal  disease and abnormal movement disorder   . Other extrapyramidal disease and abnormal movement disorder   . Other malaise and fatigue   . Other specified disease of white blood cells   . Other specified disease of white blood cells   . Pain in joint, lower leg   . Pain in limb   . Postinflammatory pulmonary fibrosis ([place])   . Regional enteritis of small intestine ([place])   . Regional enteritis of small intestine with large intestine ([place])   . Renal insufficiency   . Tobacco use disorder   . Unspecified disorder of skin and subcutaneous tissue   . Vertigo 2018  . Viral warts, unspecified   . Viremia, unspecified     Past Surgical History:  Procedure Laterality Date  . CHOLECYSTECTOMY  07-12-2010  . SMALL INTESTINE SURGERY     x 2    Allergies  Allergen Reactions  . Azathioprine     REACTION: affected WBC  . Ciprofloxacin       Pt denies any reaction  . Levaquin [Levofloxacin In D5w]     Pt denies any reaction  . Plendil [Felodipine]     Pt denies any reaction    Outpatient Encounter Medications as of 01/28/2018  Medication Sig  . allopurinol (ZYLOPRIM) 100 MG tablet Take 2 tablets (200 mg total) by mouth daily.  Marland Kitchen amLODipine (NORVASC) 2.5 MG tablet Take 2.5 mg by mouth daily.  . Calcium Carbonate-Vit D-Min 600-400 MG-UNIT TABS Take 1 tablet by mouth 3 (three) times daily.   . cholecalciferol (VITAMIN D) 1000 units tablet 2 tablets once a day   . divalproex (DEPAKOTE) 500 MG EC tablet Take 1,500 mg by mouth daily.   Marland Kitchen epoetin alfa (PROCRIT) 16384 UNIT/ML injection Inject 10,000 Units into the skin every 30 (thirty) days.   . ferrous sulfate 325 (65 FE) MG tablet Take 325 mg by mouth 2 (two) times daily.    . fluconazole (DIFLUCAN) 150 MG tablet 2 tabs first two days, then daily for 2 weeks total  . inFLIXimab (REMICADE) 100 MG injection Infuse Remicade IV schedule 1 66m/kg every 8 weeks Premedicate with Tylenol 500-658mby mouth and Benadryl 25-5051my mouth prior to infusion. Last PPD was on 12/2009.   . Loperamide HCl (LOPERAMIDE A-D PO) Take 1 capsule by mouth as needed (loose stools).   . magnesium oxide (MAG-OX) 400 (241.3 Mg) MG tablet Take 2,400 mg by mouth 3 (three) times daily.   . meclizine (ANTIVERT) 25 MG tablet Take 25 mg by mouth 3 (three) times daily as needed.   . metoprolol succinate (TOPROL-XL) 50 MG 24 hr tablet Take 50 mg by mouth daily. Take with or immediately following a meal.  . Multiple Vitamin (MULTIVITAMIN) tablet Take 1 tablet by mouth daily.    . OMarland KitchenANZapine (ZYPREXA) 7.5 MG tablet Take 1 tablet (7.5 mg total) by mouth at bedtime.  . oMarland Kitcheneprazole (PRILOSEC) 20 MG capsule Take 20 mg by mouth daily before supper.    . oMarland Kitchenybutynin (DITROPAN XL) 15 MG 24 hr tablet Take 15 mg by mouth daily.    . prazosin (MINIPRESS) 5 MG capsule Take 5 mg by mouth at bedtime.  . vitamin E 400 UNIT  capsule Take 400 Units by mouth daily.     No facility-administered encounter medications on file as of 01/28/2018.     Review of Systems:  Review of Systems  Constitutional: Negative for chills, fever and weight loss.  HENT: Negative for congestion and hearing loss.  Eyes: Negative for blurred vision.       Glasses  Respiratory: Negative for cough and shortness of breath.   Cardiovascular: Negative for chest pain, palpitations and leg swelling.  Gastrointestinal: Negative for abdominal pain, blood in stool, constipation, diarrhea and melena.  Genitourinary: Negative for dysuria.  Musculoskeletal: Negative for falls and joint pain.  Neurological: Negative for dizziness and loss of consciousness.  Endo/Heme/Allergies: Does not bruise/bleed easily.  Psychiatric/Behavioral: Negative for depression and memory loss. The patient is not nervous/anxious and does not have insomnia.     Health Maintenance  Topic Date Due  . Hepatitis C Screening  1945/04/04  . TETANUS/TDAP  05/25/2021  . COLONOSCOPY  09/17/2027  . INFLUENZA VACCINE  Completed  . PNA vac Low Risk Adult  Completed    Physical Exam: Vitals:   01/28/18 1521  BP: 120/60  Pulse: 77  Temp: 98 F (36.7 C)  TempSrc: Oral  SpO2: 98%  Weight: 159 lb (72.1 kg)  Height: 6' 4"  (1.93 m)   Body mass index is 19.35 kg/m. Physical Exam  Constitutional: He is oriented to person, place, and time. No distress.  All thin male  Cardiovascular: Normal rate, regular rhythm, normal heart sounds and intact distal pulses.  Pulmonary/Chest: Effort normal and breath sounds normal. No stridor. No respiratory distress. He has no wheezes. He has no rales.  Abdominal: Soft. Bowel sounds are normal. He exhibits no distension. There is no tenderness.  Musculoskeletal: Normal range of motion.  Neurological: He is alert and oriented to person, place, and time.  Skin: Skin is warm and dry. Capillary refill takes less than 2 seconds.    Psychiatric: He has a normal mood and affect.    Labs reviewed: Basic Metabolic Panel: Recent Labs    05/04/17 0903 05/31/17 1203 06/11/17 06/18/17 1214  NA 141 140 139 141  K 4.5 4.1 3.5 3.5  CL 106 108  --  111  CO2 28 24  --  23  GLUCOSE 116* 83  --  103*  BUN 24 18 18 14   CREATININE 2.00* 1.77* 1.8* 1.69*  CALCIUM 9.1 9.0  --  8.5*   Liver Function Tests: Recent Labs    05/04/17 0903 05/31/17 1203 06/11/17 06/18/17 1214  AST 10 13* 9* 14*  ALT 5* 7* 6* 8*  ALKPHOS  --  53 55 46  BILITOT 0.3 0.5  --  0.5  PROT 6.9 7.1  --  6.9  ALBUMIN  --  3.2*  --  3.0*   Recent Labs    06/18/17 1214  LIPASE 31   No results for input(s): AMMONIA in the last 8760 hours. CBC: Recent Labs    05/04/17 0903  05/31/17 1203 06/11/17 06/18/17 1214  10/28/17 1228 12/30/17 1020 01/27/18 1113  WBC 8.6  --  9.5 8.3 8.8  --   --   --   --   NEUTROABS 4,773  --  6.2  --   --   --   --   --   --   HGB 11.3*   < > 11.7* 10.6* 10.0*   < > 10.9* 9.1* 10.6*  HCT 34.3*  --  37.6* 33* 31.7*  --   --   --   --   MCV 98.6  --  103.6*  --  102.6*  --   --   --   --   PLT 180  --  190 171 187  --   --   --   --    < > =  values in this interval not displayed.   Lipid Panel: Recent Labs    05/04/17 0903  CHOL 138  HDL 42  LDLCALC 71  TRIG 176*  CHOLHDL 3.3   Lab Results  Component Value Date   HGBA1C 4.9 08/10/2015   Assessment/Plan 1. Essential hypertension, benign -bp is controlled, cont same regimen  2. Candida esophagitis ([place]) -resolved, eating better and weight has stabilized  3. Crohn's disease of small intestine with rectal bleeding (HCC) -cont remicade infusions which he will now be getting at home rather than having to run to the [place] each time  4. Benign prostatic hyperplasia with urinary frequency -may try to add myrbetriq in the new year to see if covered better by then, continues oxybutynin right now  5. Chronic kidney disease (CKD), stage III (moderate)  (HCC) -cont to follow with [facility] and no meds need to be changed  6. 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 Bipolar 1 disorder ([place]) -cont his regular depakote, zyprexa per psych at [facility]  Next appt:  06/03/2018  Shondrea Steinert L. Iviana Blasingame, D.O. [facility] [address], [place] [address] Cell Phone (Mon-Fri 8am-5pm):  [phone] On Call:  [phone] & follow prompts after 5pm & weekends Office Phone:  [phone] Office Fax:  [phone]

## 2018-02-02 ENCOUNTER — Ambulatory Visit (INDEPENDENT_AMBULATORY_CARE_PROVIDER_SITE_OTHER): Payer: Medicare Other | Admitting: Nurse Practitioner

## 2018-02-02 ENCOUNTER — Encounter: Payer: Self-pay | Admitting: Nurse Practitioner

## 2018-02-02 VITALS — BP 122/84 | HR 71 | Temp 98.7°F | Ht 76.0 in | Wt 157.8 lb

## 2018-02-02 DIAGNOSIS — B379 Candidiasis, unspecified: Secondary | ICD-10-CM

## 2018-02-02 MED ORDER — NYSTATIN 100000 UNIT/ML MT SUSP: 5.0000 mL | Freq: Four times a day (QID) | OROMUCOSAL | 0 refills | Status: DC

## 2018-02-02 NOTE — Progress Notes (Signed)
Careteam: Patient Care Team: Gayland Curry, DO as PCP - General (Geriatric Medicine) Elmarie Shiley, MD (Nephrology) Milus Banister, MD (Gastroenterology)  Advanced Directive information Does Patient Have a Medical Advance Directive?: No  Allergies  Allergen Reactions  . Azathioprine     REACTION: affected WBC  . Ciprofloxacin     Pt denies any reaction  . Levaquin [Levofloxacin In D5w]     Pt denies any reaction  . Plendil [Felodipine]     Pt denies any reaction    Chief Complaint  Patient presents with  . Acute Visit    Pt developed sores on inner cheek of mouth and corner of his lips about 2 weeks ago. Pt states that it is painful to eat.      HPI: Patient is a 72 y.o. male seen in the office today due to painful sores. Unsure how long they have really been going on but making it hard to eat. Sores at the corner of his lips and inside of both cheeks.  No altered taste or painful tongue.  Has dentures Taking remicade due to Crohn's disease.    Review of Systems:  Review of Systems  Constitutional: Negative for chills and fever.  HENT: Negative for congestion, sinus pain and sore throat.        Painful area in mouth and to corners of lips     Past Medical History:  Diagnosis Date  . Abdominal pain, right lower quadrant   . Abdominal pain, unspecified site   . Acute cholecystitis   . Anemia   . Anemia, unspecified   . Anxiety   . Benign paroxysmal positional vertigo   . Bipolar I disorder, most recent episode (or current) unspecified   . Celiac disease   . Cellulitis and abscess of unspecified site   . Chronic kidney disease, stage III (moderate) (HCC)   . Cough   . Crohn's   . Depression   . Diarrhea   . Dizziness and giddiness   . Dizziness and giddiness   . Dizziness and giddiness   . Dysuria   . Edema   . Encounter for long-term (current) use of other medications   . Essential and other specified forms of tremor   . Essential hypertension,  benign   . Gout 2018  . Hypertensive renal disease, benign   . Hypertrophy of prostate without urinary obstruction and other lower urinary tract symptoms (LUTS)   . Hypopotassemia   . Impacted cerumen   . Impotence of organic origin   . Insomnia with sleep apnea, unspecified   . Insomnia with sleep apnea, unspecified   . Iron deficiency anemia, unspecified   . Loss of weight   . Malnutrition of mild degree ([place])   . 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 Nausea with vomiting   . Neuralgia, neuritis, and radiculitis, unspecified   . Other abnormal blood chemistry   . Other B-complex deficiencies   . Other B-complex deficiencies   . Other extrapyramidal disease and abnormal movement disorder   . Other extrapyramidal disease and abnormal movement disorder   . Other malaise and fatigue   . Other specified disease of white blood cells   . Other specified disease of white blood cells   . Pain in joint, lower leg   . Pain in limb   . Postinflammatory pulmonary fibrosis ([place])   . Regional enteritis of small intestine ([place])   . Regional enteritis of small intestine with large intestine ([place])   .  Renal insufficiency   . Tobacco use disorder   . Unspecified disorder of skin and subcutaneous tissue   . Vertigo 2018  . Viral warts, unspecified   . Viremia, unspecified    Past Surgical History:  Procedure Laterality Date  . CHOLECYSTECTOMY  07-12-2010  . SMALL INTESTINE SURGERY     x 2   Social History:   reports that he quit smoking about 3 years ago. His smoking use included cigarettes. He started smoking about 61 years ago. He has a 49.00 pack-year smoking history. He has never used smokeless tobacco. He reports that he does not drink alcohol or use drugs.  Family History  Problem Relation Age of Onset  . Diabetes Mother        maternal grandmother  . Uterine cancer Mother   . Emphysema Father   . Pneumonia Maternal Grandmother   . Colon cancer Neg Hx     Medications: Patient's  Medications  New Prescriptions   No medications on file  Previous Medications   ALLOPURINOL (ZYLOPRIM) 100 MG TABLET    Take 2 tablets (200 mg total) by mouth daily.   AMLODIPINE (NORVASC) 2.5 MG TABLET    Take 2.5 mg by mouth daily.   CALCIUM CARBONATE-VIT D-MIN 600-400 MG-UNIT TABS    Take 1 tablet by mouth 3 (three) times daily.    CHOLECALCIFEROL (VITAMIN D) 1000 UNITS TABLET    2 tablets once a day    DIVALPROEX (DEPAKOTE) 500 MG EC TABLET    Take 1,500 mg by mouth daily.    EPOETIN ALFA (PROCRIT) 16109 UNIT/ML INJECTION    Inject 10,000 Units into the skin every 30 (thirty) days.    FERROUS SULFATE 325 (65 FE) MG TABLET    Take 325 mg by mouth 2 (two) times daily.     INFLIXIMAB (REMICADE) 100 MG INJECTION    Infuse Remicade IV schedule 1 55m/kg every 8 weeks Premedicate with Tylenol 500-6549mby mouth and Benadryl 25-5064my mouth prior to infusion. Last PPD was on 12/2009.    LOPERAMIDE HCL (LOPERAMIDE A-D PO)    Take 1 capsule by mouth as needed (loose stools).    MAGNESIUM OXIDE (MAG-OX) 400 (241.3 MG) MG TABLET    Take 2,400 mg by mouth 3 (three) times daily.    MECLIZINE (ANTIVERT) 25 MG TABLET    Take 25 mg by mouth 3 (three) times daily as needed.    METOPROLOL SUCCINATE (TOPROL-XL) 50 MG 24 HR TABLET    Take 50 mg by mouth daily. Take with or immediately following a meal.   MULTIPLE VITAMIN (MULTIVITAMIN) TABLET    Take 1 tablet by mouth daily.     OLANZAPINE (ZYPREXA) 7.5 MG TABLET    Take 1 tablet (7.5 mg total) by mouth at bedtime.   OMEPRAZOLE (PRILOSEC) 20 MG CAPSULE    Take 20 mg by mouth daily before supper.     OXYBUTYNIN (DITROPAN XL) 15 MG 24 HR TABLET    Take 15 mg by mouth daily.     PRAZOSIN (MINIPRESS) 5 MG CAPSULE    Take 5 mg by mouth at bedtime.   VITAMIN E 400 UNIT CAPSULE    Take 400 Units by mouth daily.    Modified Medications   No medications on file  Discontinued Medications   No medications on file     Physical Exam:  Vitals:   02/02/18 1047    BP: 122/84  Pulse: 71  Temp: 98.7 F (37.1 C)  TempSrc:  Oral  SpO2: 96%  Weight: 157 lb 12.8 oz (71.6 kg)  Height: 6' 4"  (1.93 m)   Body mass index is 19.21 kg/m.  Physical Exam  Constitutional: He appears well-developed and well-nourished.  HENT:  Head: Normocephalic and atraumatic.  Dentures Thrush noted to tongue and to buccal area bilaterally.     Labs reviewed: Basic Metabolic Panel: Recent Labs    05/04/17 0903 05/31/17 1203 06/11/17 06/18/17 1214  NA 141 140 139 141  K 4.5 4.1 3.5 3.5  CL 106 108  --  111  CO2 28 24  --  23  GLUCOSE 116* 83  --  103*  BUN 24 18 18 14   CREATININE 2.00* 1.77* 1.8* 1.69*  CALCIUM 9.1 9.0  --  8.5*   Liver Function Tests: Recent Labs    05/04/17 0903 05/31/17 1203 06/11/17 06/18/17 1214  AST 10 13* 9* 14*  ALT 5* 7* 6* 8*  ALKPHOS  --  53 55 46  BILITOT 0.3 0.5  --  0.5  PROT 6.9 7.1  --  6.9  ALBUMIN  --  3.2*  --  3.0*   Recent Labs    06/18/17 1214  LIPASE 31   No results for input(s): AMMONIA in the last 8760 hours. CBC: Recent Labs    05/04/17 0903  05/31/17 1203 06/11/17 06/18/17 1214  10/28/17 1228 12/30/17 1020 01/27/18 1113  WBC 8.6  --  9.5 8.3 8.8  --   --   --   --   NEUTROABS 4,773  --  6.2  --   --   --   --   --   --   HGB 11.3*   < > 11.7* 10.6* 10.0*   < > 10.9* 9.1* 10.6*  HCT 34.3*  --  37.6* 33* 31.7*  --   --   --   --   MCV 98.6  --  103.6*  --  102.6*  --   --   --   --   PLT 180  --  190 171 187  --   --   --   --    < > = values in this interval not displayed.   Lipid Panel: Recent Labs    05/04/17 0903  CHOL 138  HDL 42  LDLCALC 71  TRIG 176*  CHOLHDL 3.3   TSH: No results for input(s): TSH in the last 8760 hours. A1C: Lab Results  Component Value Date   HGBA1C 4.9 08/10/2015     Assessment/Plan 1. Candidiasis -to clean dentures thoroughly daily -to swish and spit 4 times daily for 1 week - nystatin (MYCOSTATIN) 100000 UNIT/ML suspension; Use as directed 5  mLs (500,000 Units total) in the mouth or throat 4 (four) times daily.  Dispense: 60 mL; Refill: 0  Follow up if symptoms do not improve or worsen Asjia Berrios K. [place], [facility] [phone]

## 2018-02-07 ENCOUNTER — Emergency Department (HOSPITAL_COMMUNITY): Payer: Medicare Other

## 2018-02-07 ENCOUNTER — Emergency Department (HOSPITAL_COMMUNITY)
Admission: EM | Admit: 2018-02-07 | Discharge: 2018-02-07 | Disposition: A | Discharge status: Home / Self Care | Payer: Medicare Other | Attending: Emergency Medicine | Admitting: Emergency Medicine

## 2018-02-07 ENCOUNTER — Other Ambulatory Visit: Payer: Self-pay

## 2018-02-07 DIAGNOSIS — R269 Unspecified abnormalities of gait and mobility: Secondary | ICD-10-CM | POA: Diagnosis not present

## 2018-02-07 DIAGNOSIS — R270 Ataxia, unspecified: Secondary | ICD-10-CM | POA: Diagnosis not present

## 2018-02-07 DIAGNOSIS — I129 Hypertensive chronic kidney disease with stage 1 through stage 4 chronic kidney disease, or unspecified chronic kidney disease: Secondary | ICD-10-CM | POA: Insufficient documentation

## 2018-02-07 DIAGNOSIS — D649 Anemia, unspecified: Secondary | ICD-10-CM | POA: Insufficient documentation

## 2018-02-07 DIAGNOSIS — R2689 Other abnormalities of gait and mobility: Secondary | ICD-10-CM

## 2018-02-07 DIAGNOSIS — Z87891 Personal history of nicotine dependence: Secondary | ICD-10-CM | POA: Insufficient documentation

## 2018-02-07 DIAGNOSIS — N183 Chronic kidney disease, stage 3 (moderate): Secondary | ICD-10-CM | POA: Diagnosis not present

## 2018-02-07 DIAGNOSIS — R42 Dizziness and giddiness: Secondary | ICD-10-CM | POA: Insufficient documentation

## 2018-02-07 DIAGNOSIS — Z79899 Other long term (current) drug therapy: Secondary | ICD-10-CM | POA: Diagnosis not present

## 2018-02-07 DIAGNOSIS — R531 Weakness: Secondary | ICD-10-CM | POA: Diagnosis not present

## 2018-02-07 LAB — COMPREHENSIVE METABOLIC PANEL
ALT: 22 U/L (ref 0–44)
AST: 28 U/L (ref 15–41)
Albumin: 3.2 g/dL — ABNORMAL LOW (ref 3.5–5.0)
Alkaline Phosphatase: 63 U/L (ref 38–126)
Anion gap: 9 (ref 5–15)
BILIRUBIN TOTAL: 0.4 mg/dL (ref 0.3–1.2)
BUN: 29 mg/dL — ABNORMAL HIGH (ref 8–23)
CO2: 25 mmol/L (ref 22–32)
Calcium: 9.2 mg/dL (ref 8.9–10.3)
Chloride: 104 mmol/L (ref 98–111)
Creatinine, Ser: 2.14 mg/dL — ABNORMAL HIGH (ref 0.61–1.24)
GFR calc Af Amer: 35 mL/min — ABNORMAL LOW (ref >60)
GFR calc non Af Amer: 30 mL/min — ABNORMAL LOW (ref >60)
Glucose, Bld: 98 mg/dL (ref 70–99)
Potassium: 5 mmol/L (ref 3.5–5.1)
Sodium: 138 mmol/L (ref 135–145)
Total Protein: 6.9 g/dL (ref 6.5–8.1)

## 2018-02-07 LAB — CBC
HCT: 36.2 % — ABNORMAL LOW (ref 39.0–52.0)
Hemoglobin: 11.3 g/dL — ABNORMAL LOW (ref 13.0–17.0)
MCH: 33.9 pg (ref 26.0–34.0)
MCHC: 31.2 g/dL (ref 30.0–36.0)
MCV: 108.7 fL — ABNORMAL HIGH (ref 80.0–100.0)
Platelets: 145 10*3/uL — ABNORMAL LOW (ref 150–400)
RBC: 3.33 MIL/uL — AB (ref 4.22–5.81)
RDW: 13 % (ref 11.5–15.5)
WBC: 6.2 10*3/uL (ref 4.0–10.5)
nRBC: 0 % (ref 0.0–0.2)

## 2018-02-07 LAB — DIFFERENTIAL
ABS IMMATURE GRANULOCYTES: 0.02 10*3/uL (ref 0.00–0.07)
BASOS PCT: 0 %
Basophils Absolute: 0 10*3/uL (ref 0.0–0.1)
Eosinophils Absolute: 0.1 10*3/uL (ref 0.0–0.5)
Eosinophils Relative: 1 %
Immature Granulocytes: 0 %
Lymphocytes Relative: 15 %
Lymphs Abs: 0.9 10*3/uL (ref 0.7–4.0)
Monocytes Absolute: 0.8 10*3/uL (ref 0.1–1.0)
Monocytes Relative: 13 %
Neutro Abs: 4.4 10*3/uL (ref 1.7–7.7)
Neutrophils Relative %: 71 %

## 2018-02-07 LAB — I-STAT CHEM 8, ED
BUN: 28 mg/dL — ABNORMAL HIGH (ref 8–23)
CREATININE: 2.2 mg/dL — AB (ref 0.61–1.24)
Calcium, Ion: 1.28 mmol/L (ref 1.15–1.40)
Chloride: 103 mmol/L (ref 98–111)
Glucose, Bld: 91 mg/dL (ref 70–99)
HCT: 36 % — ABNORMAL LOW (ref 39.0–52.0)
HEMOGLOBIN: 12.2 g/dL — AB (ref 13.0–17.0)
Potassium: 5 mmol/L (ref 3.5–5.1)
Sodium: 139 mmol/L (ref 135–145)
TCO2: 28 mmol/L (ref 22–32)

## 2018-02-07 LAB — RAPID URINE DRUG SCREEN, HOSP PERFORMED
Amphetamines: NOT DETECTED
BENZODIAZEPINES: NOT DETECTED
Barbiturates: NOT DETECTED
Cocaine: NOT DETECTED
Opiates: NOT DETECTED
Tetrahydrocannabinol: NOT DETECTED

## 2018-02-07 LAB — URINALYSIS, ROUTINE W REFLEX MICROSCOPIC
Bilirubin Urine: NEGATIVE
GLUCOSE, UA: NEGATIVE mg/dL
Hgb urine dipstick: NEGATIVE
Ketones, ur: 5 mg/dL — AB
Leukocytes, UA: NEGATIVE
Nitrite: NEGATIVE
PROTEIN: NEGATIVE mg/dL
Specific Gravity, Urine: 1.017 (ref 1.005–1.030)
pH: 5 (ref 5.0–8.0)

## 2018-02-07 LAB — ETHANOL: Alcohol, Ethyl (B): <10 mg/dL (ref <10)

## 2018-02-07 LAB — PROTIME-INR
INR: 1.13
Prothrombin Time: 14.4 seconds (ref 11.4–15.2)

## 2018-02-07 LAB — I-STAT TROPONIN, ED: TROPONIN I, POC: 0.01 ng/mL (ref 0.00–0.08)

## 2018-02-07 LAB — APTT: aPTT: 34 seconds (ref 24–36)

## 2018-02-07 MED ORDER — SODIUM CHLORIDE 0.9 % IV BOLUS
1000.0000 mL | Freq: Once | INTRAVENOUS | Status: AC
  Administered 2018-02-07: 1000 mL via INTRAVENOUS

## 2018-02-07 NOTE — ED Notes (Signed)
Patient transported to MRI 

## 2018-02-07 NOTE — ED Notes (Signed)
Bed: WA04 Expected date:  Expected time:  Means of arrival:  Comments: EMs

## 2018-02-07 NOTE — ED Provider Notes (Signed)
[facility] DEPT Provider Note   CSN: [id number] Arrival date & time: 02/07/18  1217     History   Chief Complaint Chief Complaint  Patient presents with  . Extremity Weakness    HPI DERRECK [name] is a 72 y.o. male history of anemia, hypertension, dizziness here presenting with trouble walking.  Patient apparently has intermittent shuffling gait that is going on for last several months.  Patient states that he woke around 9 AM this morning and he states that his gait seems off a little bit.  He states that he shuffles more than usual.  He denies any resting tremors.  Denies any trouble speaking or weakness or falling.  He managed to go to church and was told that he seems weaker than usual so EMS was called and patient was sent to the ED.  Patient states that he is eating and drinking well.  Patient states that he has no previous hx of stroke or parkinson's.   The history is provided by the patient.    Past Medical History:  Diagnosis Date  . Abdominal pain, right lower quadrant   . Abdominal pain, unspecified site   . Acute cholecystitis   . Anemia   . Anemia, unspecified   . Anxiety   . Benign paroxysmal positional vertigo   . Bipolar I disorder, most recent episode (or current) unspecified   . Celiac disease   . Cellulitis and abscess of unspecified site   . Chronic kidney disease, stage III (moderate) (HCC)   . Cough   . Crohn's   . Depression   . Diarrhea   . Dizziness and giddiness   . Dizziness and giddiness   . Dizziness and giddiness   . Dysuria   . Edema   . Encounter for long-term (current) use of other medications   . Essential and other specified forms of tremor   . Essential hypertension, benign   . Gout 2018  . Hypertensive renal disease, benign   . Hypertrophy of prostate without urinary obstruction and other lower urinary tract symptoms (LUTS)   . Hypopotassemia   . Impacted cerumen   . Impotence of organic origin   .  Insomnia with sleep apnea, unspecified   . Insomnia with sleep apnea, unspecified   . Iron deficiency anemia, unspecified   . Loss of weight   . Malnutrition of mild degree ([place])   . 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 Nausea with vomiting   . Neuralgia, neuritis, and radiculitis, unspecified   . Other abnormal blood chemistry   . Other B-complex deficiencies   . Other B-complex deficiencies   . Other extrapyramidal disease and abnormal movement disorder   . Other extrapyramidal disease and abnormal movement disorder   . Other malaise and fatigue   . Other specified disease of white blood cells   . Other specified disease of white blood cells   . Pain in joint, lower leg   . Pain in limb   . Postinflammatory pulmonary fibrosis ([place])   . Regional enteritis of small intestine ([place])   . Regional enteritis of small intestine with large intestine ([place])   . Renal insufficiency   . Tobacco use disorder   . Unspecified disorder of skin and subcutaneous tissue   . Vertigo 2018  . Viral warts, unspecified   . Viremia, unspecified     Patient Active Problem List   Diagnosis Date Noted  . Candida esophagitis ([place]) 09/22/2017  . History of smoking 30  or more pack years 01/05/2017  . Bipolar 1 disorder ([place])   . BPH (benign prostatic hyperplasia) 03/31/2013  . 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 Essential hypertension, benign 03/31/2013  . Chronic kidney disease (CKD), stage III (moderate) ([place]) 06/04/2012  . 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 CHOLECYSTECTOMY  07-12-2010  . SMALL INTESTINE SURGERY     x 2        Home Medications    Prior to Admission medications   Medication Sig Start Date End Date Taking? Authorizing Provider  allopurinol (ZYLOPRIM) 100 MG tablet Take 2 tablets (200 mg total) by mouth daily. 04/10/16  Yes Lauree Chandler, NP  amLODipine (NORVASC) 2.5 MG tablet Take 2.5 mg by mouth daily.   Yes  [provider]  Calcium Carbonate-Vit D-Min 600-400 MG-UNIT TABS Take 1 tablet by mouth 3 (three) times daily.    Yes [provider]  cholecalciferol (VITAMIN D) 1000 units tablet Take 1,000 Units by mouth daily.    Yes [provider]  divalproex (DEPAKOTE) 500 MG EC tablet Take 1,500 mg by mouth at bedtime.    Yes [provider]  epoetin alfa (PROCRIT) 01779 UNIT/ML injection Inject 10,000 Units into the skin every 30 (thirty) days.    Yes [provider]  ferrous sulfate 325 (65 FE) MG tablet Take 325 mg by mouth 2 (two) times daily.     Yes [provider]  magnesium oxide (MAG-OX) 400 (241.3 Mg) MG tablet Take 2,400 mg by mouth 3 (three) times daily.  04/17/17  Yes [provider]  meclizine (ANTIVERT) 25 MG tablet Take 25 mg by mouth 3 (three) times daily as needed for dizziness.  06/21/16  Yes [provider]  metoprolol succinate (TOPROL-XL) 50 MG 24 hr tablet Take 50 mg by mouth daily. Take with or immediately following a meal.   Yes [provider]  Multiple Vitamin (MULTIVITAMIN) tablet Take 1 tablet by mouth daily.     Yes [provider]  nystatin (MYCOSTATIN) 100000 UNIT/ML suspension Use as directed 5 mLs (500,000 Units total) in the mouth or throat 4 (four) times daily. 02/02/18  Yes Lauree Chandler, NP  OLANZapine (ZYPREXA) 7.5 MG tablet Take 1 tablet (7.5 mg total) by mouth at bedtime. 01/03/16  Yes Reed, Tiffany L, DO  omeprazole (PRILOSEC) 20 MG capsule Take 20 mg by mouth daily before supper.     Yes [provider]  oxybutynin (DITROPAN XL) 15 MG 24 hr tablet Take 15 mg by mouth daily.     Yes [provider]  prazosin (MINIPRESS) 5 MG capsule Take 5 mg by mouth at bedtime.   Yes [provider]  vitamin E 400 UNIT capsule Take 400 Units by mouth daily.     Yes [provider]  inFLIXimab (REMICADE) 100 MG injection Infuse Remicade IV schedule 1 88m/kg  every 8 weeks Premedicate with Tylenol 500-6554mby mouth and Benadryl 25-5060my mouth prior to infusion. Last PPD was on 12/2009.  11/22/10   JacMilus BanisterD  Loperamide HCl (LOPERAMIDE A-D PO) Take 1 capsule by mouth as needed (loose stools).     [provider]    Family History Family History  Problem Relation Age of Onset  . Diabetes Mother        maternal grandmother  . Uterine cancer Mother   . Emphysema Father   . Pneumonia Maternal Grandmother   .  Colon cancer Neg Hx     Social History Social History   Tobacco Use  . Smoking status: Former Smoker    Packs/day: 1.00    Years: 49.00    Pack years: 49.00    Types: Cigarettes    Start date: 04/11/1956    Last attempt to quit: 04/17/2014    Years since quitting: 3.8  . Smokeless tobacco: Never Used  Substance Use Topics  . Alcohol use: No    Alcohol/week: 0.0 standard drinks  . Drug use: No     Allergies   Azathioprine; Ciprofloxacin; Levaquin [levofloxacin in d5w]; and Plendil [felodipine]   Review of Systems Review of Systems  Musculoskeletal: Positive for extremity weakness.  Neurological:       Trouble with gait   All other systems reviewed and are negative.    Physical Exam Updated Vital Signs BP 134/81   Pulse 84   Temp 98.1 F (36.7 C) (Oral)   Resp 16   Ht 6' 4"  (1.93 m)   Wt 74.8 kg   SpO2 94%   BMI 20.08 kg/m   Physical Exam Vitals signs and nursing note reviewed.  HENT:     Head: Normocephalic.     Right Ear: Tympanic membrane normal.     Left Ear: Tympanic membrane normal.     Nose: Nose normal.     Mouth/Throat:     Mouth: Mucous membranes are moist.  Eyes:     Extraocular Movements: Extraocular movements intact.     Pupils: Pupils are equal, round, and reactive to light.     Comments: No obvious nystagmus   Neck:     Musculoskeletal: Normal range of motion and neck supple.  Cardiovascular:     Rate and Rhythm: Normal rate and regular rhythm.     Pulses:  Normal pulses.     Heart sounds: Normal heart sounds.  Pulmonary:     Effort: Pulmonary effort is normal.     Breath sounds: Normal breath sounds.  Abdominal:     General: Abdomen is flat.     Palpations: Abdomen is soft.  Musculoskeletal: Normal range of motion.  Skin:    General: Skin is warm.     Capillary Refill: Capillary refill takes less than 2 seconds.  Neurological:     General: No focal deficit present.     Mental Status: He is alert and oriented to person, place, and time.     Comments: CN 2-12 intact, nl finger to nose, nl strength and sensation throughout. Shuffling gait. Some resting tremors (baseline)  Psychiatric:        Mood and Affect: Mood normal.      ED Treatments / Results  Labs (all labs ordered are listed, but only abnormal results are displayed) Labs Reviewed  CBC - Abnormal; Notable for the following components:      Result Value   RBC 3.33 (*)    Hemoglobin 11.3 (*)    HCT 36.2 (*)    MCV 108.7 (*)    Platelets 145 (*)    All other components within normal limits  COMPREHENSIVE METABOLIC PANEL - Abnormal; Notable for the following components:   BUN 29 (*)    Creatinine, Ser 2.14 (*)    Albumin 3.2 (*)    GFR calc non Af Amer 30 (*)    GFR calc Af Amer 35 (*)    All other components within normal limits  URINALYSIS, ROUTINE W REFLEX MICROSCOPIC - Abnormal; Notable for the following  components:   Ketones, ur 5 (*)    All other components within normal limits  I-STAT CHEM 8, ED - Abnormal; Notable for the following components:   BUN 28 (*)    Creatinine, Ser 2.20 (*)    Hemoglobin 12.2 (*)    HCT 36.0 (*)    All other components within normal limits  ETHANOL  PROTIME-INR  APTT  DIFFERENTIAL  RAPID URINE DRUG SCREEN, HOSP PERFORMED  I-STAT TROPONIN, ED    EKG EKG Interpretation  Date/Time:  Sunday February 07 2018 12:28:11 EST Ventricular Rate:  84 PR Interval:    QRS Duration: 88 QT Interval:  360 QTC Calculation: 426 R  Axis:   4 Text Interpretation:  Sinus rhythm Anteroseptal infarct, old Borderline T abnormalities, inferior leads No significant change since last tracing Confirmed by Wandra Arthurs [phone]) on 02/07/2018 1:16:49 PM   Radiology Ct Head Wo Contrast  Result Date: 02/07/2018 CLINICAL DATA:  Weakness.  Ataxia. EXAM: CT HEAD WITHOUT CONTRAST TECHNIQUE: Contiguous axial images were obtained from the base of the skull through the vertex without intravenous contrast. COMPARISON:  None. FINDINGS: Brain: No evidence of acute infarction, hemorrhage, hydrocephalus, extra-axial collection or mass lesion/mass effect. Vascular: No hyperdense vessel or unexpected calcification. Skull: Normal. Negative for fracture or focal lesion. Sinuses/Orbits: Mucosal thickening in the left maxillary sinus. Other: None. IMPRESSION: Mucosal thickening in the left maxillary sinus. No acute intracranial abnormalities. Electronically Signed   By: Dorise Bullion III M.D   On: 02/07/2018 14:34    Procedures Procedures (including critical care time)  Medications Ordered in ED Medications  sodium chloride 0.9 % bolus 1,000 mL (0 mLs Intravenous Stopped 02/07/18 1451)     Initial Impression / Assessment and Plan / ED Course  I have reviewed the triage vital signs and the nursing notes.  Pertinent labs & imaging results that were available during my care of the patient were reviewed by me and considered in my medical decision making (see chart for details).    DEAGLAN LILE is a 72 y.o. male here with shuffling gait. Suspect underlying parkinson's disease. Consider posterior circulation stroke as well. Will get labs, CT head, MRI brain.   4:13 PM Labs showed Cr 2.2, slightly worse than baseline. Given 1 L NS bolus. CT head nl. MRI brain pending. Signed out to Dr. Ralene Bathe to follow up MRI. If negative, anticipate dc home with neurology follow up.     Final Clinical Impressions(s) / ED Diagnoses   Final diagnoses:  None     ED Discharge Orders    None       Drenda Freeze, MD 02/07/18 [phone]

## 2018-02-07 NOTE — ED Provider Notes (Signed)
Pt care assumed at 1600 pending MRI. Pt here for evaluation of one month of gait difficulty with shuffling gait, had a fall yesterday.    MRI neg for acute abnormality.  Plan to d/c home with outpatient neuro follow up.  BMP with renal insufficiency, similar when compared to priors.  Discussed with pt findings of studies, importance of outpatient follow up.     Quintella Reichert, MD 02/07/18 [phone]

## 2018-02-07 NOTE — ED Triage Notes (Signed)
Pt BIB EMS from church with complaints of "shuffling" of his legs since yesterday.  Pt reported not being able to take as long of strides as usual.

## 2018-02-07 NOTE — ED Notes (Signed)
Patient transported to CT 

## 2018-02-09 ENCOUNTER — Encounter: Payer: Self-pay | Admitting: Neurology

## 2018-02-09 ENCOUNTER — Ambulatory Visit: Payer: Medicare Other | Admitting: Neurology

## 2018-02-09 VITALS — BP 131/65 | HR 102 | Ht 76.0 in | Wt 155.0 lb

## 2018-02-09 DIAGNOSIS — R2689 Other abnormalities of gait and mobility: Secondary | ICD-10-CM

## 2018-02-09 NOTE — Patient Instructions (Addendum)
I believe you have a multifactorial gait disorder, meaning, that it is [place] is due to a combination of factors. 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 Please try to get a walker for safety. Drink more water.   Please remember to stand up slowly and get your bearings first turn slowly, no bending down to pick anything, no heavy lifting, be extra careful at night and first thing in the morning. Also, be careful in the Bathroom and the kitchen.   Remember to drink plenty of fluid, eat healthy meals and do not skip any meals. Try to eat protein with a every meal and eat a healthy snack such as fruit or nuts or yogurt in between meals. Try to keep a regular sleep-wake schedule and try to exercise daily, particularly in the form of walking, 20-30 minutes a day, if you can.   As far as your medications are concerned, I would like to suggest no new medications. Please talk to your psychiatrist about your medication regimen.    As far as diagnostic testing: you have had a brain scan, called MRI, which showed fairly age-appropriate findings. You could consider a DaT scan, which is a specialized brain scan designed to help with diagnosis of tremor disorders. A radioactive marker gets injected and the uptake is measured in the brain and compared to normal controls and right side is compared to the left, a change in uptake can help with diagnosis of certain tremor disorders. It may not be safe to do this scan given your kidney disease.   Unfortunately, there is not a whole lot I can add. You can follow up with the [facility] and your primary care.

## 2018-02-09 NOTE — Progress Notes (Signed)
Subjective:    Patient ID: Brent [name] Brent [name] [name] is a 72 y.o. male.  HPI     Brent [name] Age, MD, PhD [facility] [address] [place], [place] [address]  I saw, patient, Brent [name] Brent [name] [name], as a referral from the emergency room for his weakness and gait disturbance. The patient is unaccompanied today, was dropped off by a friend from church. He is a 85 year old right-handed gentleman with an underlying complex medical history of hypertension, tremor, gout, chronic renal insufficiency, Crohn's disease, mood disorder, anemia, on multiple medications, who presented to the emergency room on 02/07/2018. I reviewed the emergency room records. He had a CT head without contrast on 02/07/2018 and I reviewed the results: IMPRESSION: Mucosal thickening in the left maxillary sinus. No acute intracranial abnormalities.   He had a brain MRI without contrast on 02/07/2018 and I reviewed the test results: IMPRESSION: 1.  No acute intracranial abnormality. 2. Largely unremarkable noncontrast MRI appearance of the brain, stable since 2011 aside from interval cerebral volume loss which appears appropriate for Brent [name] [name].  He is on numerous medications including Procrit, Diflucan, Remicade, allopurinol, Norvasc, vitamin D, Depakote, when necessary loperamide, magnesium, meclizine as needed, Toprol-XL, Zyprexa, Prilosec, Detrol patient, prazosin, vitamin E. For his mood disorder including bipolar diagnosis he is followed by the [place]. of note, he has been on Zyprexa for many years, likely over 10 years he estimates, he has been on Depakote a few years, he estimates less than 5 years. He has had some falls. He has been losing weight. He sees his primary care physician for this. He has had global weakness, has not used a walker but has thought about it. He lives alone, his son lives in [place], he has a sister in town who helps out. He has had cataracts and does not drive at night but does drive  some. He does not like to drink water and admits that he drinks Kool-Aid, about 3-4 cups per day on average, no sodas but likes coffee about 3 cups per day. He denies any consistent tremors. He has no family history of Parkinson's disease.  His Past Medical History Is Significant For: Past Medical History:  Diagnosis Date  . Abdominal pain, right lower quadrant   . Abdominal pain, unspecified site   . Acute cholecystitis   . Anemia   . Anemia, unspecified   . Anxiety   . Benign paroxysmal positional vertigo   . Bipolar I disorder, most recent episode (or current) unspecified   . Celiac disease   . Cellulitis and abscess of unspecified site   . Chronic kidney disease, stage III (moderate) (HCC)   . Cough   . Crohn's   . Depression   . Diarrhea   . Dizziness and giddiness   . Dizziness and giddiness   . Dizziness and giddiness   . Dysuria   . Edema   . Encounter for long-term (current) use of other medications   . Essential and other specified forms of tremor   . Essential hypertension, benign   . Gout 2018  . Hypertensive renal disease, benign   . Hypertrophy of prostate without urinary obstruction and other lower urinary tract symptoms (LUTS)   . Hypopotassemia   . Impacted cerumen   . Impotence of organic origin   . Insomnia with sleep apnea, unspecified   . Insomnia with sleep apnea, unspecified   . Iron deficiency anemia, unspecified   . Loss of weight   . Malnutrition of  mild degree ([place])   . 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 Nausea with vomiting   . Neuralgia, neuritis, and radiculitis, unspecified   . Other abnormal blood chemistry   . Other B-complex deficiencies   . Other B-complex deficiencies   . Other extrapyramidal disease and abnormal movement disorder   . Other extrapyramidal disease and abnormal movement disorder   . Other malaise and fatigue   . Other specified disease of white blood cells   . Other specified disease of white blood cells   . Pain in joint,  lower leg   . Pain in limb   . Postinflammatory pulmonary fibrosis ([place])   . Regional enteritis of small intestine ([place])   . Regional enteritis of small intestine with large intestine ([place])   . Renal insufficiency   . Tobacco use disorder   . Unspecified disorder of skin and subcutaneous tissue   . Vertigo 2018  . Viral warts, unspecified   . Viremia, unspecified     His Past Surgical History Is Significant For: Past Surgical History:  Procedure Laterality Date  . CHOLECYSTECTOMY  07-12-2010  . SMALL INTESTINE SURGERY     x 2    His Family History Is Significant For: Family History  Problem Relation Brent [name] [name] of Onset  . Diabetes Mother        maternal grandmother  . Uterine cancer Mother   . Emphysema Father   . Pneumonia Maternal Grandmother   . Colon cancer Neg Hx     His Social History Is Significant For: Social History   Socioeconomic History  . Marital status: Divorced    Spouse name: Not on file  . Number of children: 1  . Years of education: Not on file  . Highest education level: Not on file  Occupational History  . Occupation: retired  . Occupation: [employer]  . Financial resource strain: Not hard at all  . Food insecurity:    Worry: Never true    Inability: Never true  . Transportation needs:    Medical: No    Non-medical: No  Tobacco Use  . Smoking status: Former Smoker    Packs/day: 1.00    Years: 49.00    Pack years: 49.00    Types: Cigarettes    Start date: 04/11/1956    Last attempt to quit: 04/17/2014    Years since quitting: 3.8  . Smokeless tobacco: Never Used  Substance and Sexual Activity  . Alcohol use: No    Alcohol/week: 0.0 standard drinks  . Drug use: No  . Sexual activity: Never  Lifestyle  . Physical activity:    Days per week: 0 days    Minutes per session: 0 min  . Stress: Not at all  Relationships  . Social connections:    Talks on phone: More than three times a week    Gets together: Three times a week     Attends religious service: More than 4 times per year    Active member of club or organization: Yes    Attends meetings of clubs or organizations: 1 to 4 times per year    Relationship status: Divorced  Other Topics Concern  . Not on file  Social History Narrative  . Not on file    His Allergies Are:  Allergies  Allergen Reactions  . Azathioprine     REACTION: affected WBC  . Ciprofloxacin     Pt denies any reaction  . Levaquin [Levofloxacin In D5w]  Pt denies any reaction  . Plendil [Felodipine]     Pt denies any reaction  :   His Current Medications Are:  Outpatient Encounter Medications as of 02/09/2018  Medication Sig  . allopurinol (ZYLOPRIM) 100 MG tablet Take 2 tablets (200 mg total) by mouth daily.  Marland Kitchen amLODipine (NORVASC) 2.5 MG tablet Take 2.5 mg by mouth daily.  . Calcium Carbonate-Vit D-Min 600-400 MG-UNIT TABS Take 1 tablet by mouth 3 (three) times daily.   . cholecalciferol (VITAMIN D) 1000 units tablet Take 1,000 Units by mouth daily.   . divalproex (DEPAKOTE) 500 MG EC tablet Take 1,500 mg by mouth at bedtime.   Marland Kitchen epoetin alfa (PROCRIT) 15176 UNIT/ML injection Inject 10,000 Units into the skin every 30 (thirty) days.   . ferrous sulfate 325 (65 FE) MG tablet Take 325 mg by mouth 2 (two) times daily.    Marland Kitchen inFLIXimab (REMICADE) 100 MG injection Infuse Remicade IV schedule 1 62m/kg every 8 weeks Premedicate with Tylenol 500-6571mby mouth and Benadryl 25-5068my mouth prior to infusion. Last PPD was on 12/2009.   . Loperamide HCl (LOPERAMIDE A-D PO) Take 1 capsule by mouth as needed (loose stools).   . magnesium oxide (MAG-OX) 400 (241.3 Mg) MG tablet Take 2,400 mg by mouth 3 (three) times daily.   . meclizine (ANTIVERT) 25 MG tablet Take 25 mg by mouth 3 (three) times daily as needed for dizziness.   . metoprolol succinate (TOPROL-XL) 50 MG 24 hr tablet Take 50 mg by mouth daily. Take with or immediately following a meal.  . Multiple Vitamin (MULTIVITAMIN)  tablet Take 1 tablet by mouth daily.    . nMarland Kitchenstatin (MYCOSTATIN) 100000 UNIT/ML suspension Use as directed 5 mLs (500,000 Units total) in the mouth or throat 4 (four) times daily.  . OMarland KitchenANZapine (ZYPREXA) 7.5 MG tablet Take 1 tablet (7.5 mg total) by mouth at bedtime.  . oMarland Kitcheneprazole (PRILOSEC) 20 MG capsule Take 20 mg by mouth daily before supper.    . oMarland Kitchenybutynin (DITROPAN XL) 15 MG 24 hr tablet Take 15 mg by mouth daily.    . prazosin (MINIPRESS) 5 MG capsule Take 5 mg by mouth at bedtime.  . vitamin E 400 UNIT capsule Take 400 Units by mouth daily.     No facility-administered encounter medications on file as of 02/09/2018.   : Review of Systems:  Out of a complete 14 point review of systems, all are reviewed and negative with the exception of these symptoms as listed below: Review of Systems  Neurological:       Pt presents today to discuss his weakness in his legs. He is concerned about his "shuffling" and his risk for falls.    Objective:  Neurological Exam  Physical Exam Physical Examination:   Vitals:   02/09/18 1320  BP: 131/65  Pulse: (!) 102    General Examination: The patient is a very pleasant 72 42o. male in no acute distress. He appearsThin and frail, deconditioned, adequately groomed.  HEENT: Normocephalic, atraumatic, pupils are equal, round and reactive to light and accommodation. Funduscopic exam is not possible due to dense cataracts. He has minimal facial masking, slight decrease in eye blink rate, mild nuchal rigidity, no lip, neck or jaw tremor. He has no carotid bruits. Airway examination reveals mild to moderate mouth dryness, thicker saliva, moderate airway crowding. He has dentures. Tongue protrudes centrally and palate elevates symmetrically.   Chest: Clear to auscultation without wheezing, rhonchi or crackles noted.  Heart: S1+S2+0, regular and  normal without murmurs, rubs or gallops noted.   Abdomen: Soft, non-tender and non-distended with normal bowel  sounds appreciated on auscultation.  Extremities: There is no pitting edema in the distal lower extremities bilaterally.   Skin: Warm and dry without trophic changes noted.  Musculoskeletal: exam reveals no obvious joint deformities, tenderness or joint swelling or erythema.   Neurologically:  Mental status: The patient is awake, alert and oriented in all 4 spheres. His immediate and remote memory, attention, language skills and fund of knowledge are fairly appropriate. Speech is clear with normal prosody and enunciation. Thought process is linear. Mood is normal and affect is normal.  Cranial nerves II - XII are as described above under HEENT exam. In addition: shoulder shrug is normal with equal shoulder height noted. Motor exam: Thin bulk, global strength of 4 out of 5, no drift, no resting tremor, no postural or action tremor. Romberg is not testable due to safety concerns. Reflexes are 1+ in the upper extremities trace in the knees and absent in the ankles. Fine motor skills and coordination: Globally mildly impaired with regards to finger taps and rapid alternating patting, no lateralization, foot taps and foot agility are fairly good.  Cerebellar testing: No dysmetria or intention tremor.  Sensory exam: intact to light touch.  Gait, station and balance: He standswith mild difficulty and pushes himself up. He requires no assistance. His posture is mild to moderately stooped for Brent [name] [name], he walks slowly and cautiously, slight stutter steps at times, turns cautiously in 3-4 steps, no freezing, he has slightly reduced arm swing bilaterally.  Assessment and Plan:    In summary, Brent [name] Brent [name] [name] is a very pleasant 72 y.o.-year old male with an underlying complex medical history of hypertension, tremor, gout, chronic renal insufficiency, Crohn's disease, mood disorder, anemia, on multiple medications, who presents for evaluation of his gait disorder. He was referred from the emergency room where he  presented recently. I reviewed the emergency room records. He had a head CT without contrast and brain MRI without contrast which showed fairly Brent [name] [name]-appropriate findings. He has a nonspecific gait disorder, also mild global weakness, mild fine motor dyscontrol. Overall, his gait disturbance is likely multifactorial in nature secondary to weight loss and muscular deconditioning, prior falls, taking multiple potentially sedating medications and medications that can affect the balance including his bladder medication but he is also taking several psychotropic medications. 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 He is suboptimally hydrated, his BUN and creatinine level were elevated and he does have a history of chronic kidney disease. In addition, he does not drink much in the way of water and overall fluid intake is suboptimal by self admission. Unfortunately, there is not a whole lot I can add at this time. He is strongly advised to have his medication regimen reviewed by his psychiatrist at the [place]. In addition, for gait safety he is advised to look into getting a walker for safety. He is encouraged to stay better hydrated with water. We could consider a DaT which is a nuclear medicine SPECT scan which we sometimes utilized to narrow down tremor disorders and parkinsonism but given his kidney impairment he would not be fully safe to take this scan at this time. I did not suggest any new medications. He is advised [facility] and [facility]. He is also advised to seek follow-up for his cataracts. He is advised that he  may not be safe to drive as he has impaired vision and fine motor dyscontrol as well as mild global weakness, likely due to deconditioning. I answered all his questions today and the patient was in agreement.  Brent [name] Age, MD, PhD

## 2018-02-10 ENCOUNTER — Ambulatory Visit (INDEPENDENT_AMBULATORY_CARE_PROVIDER_SITE_OTHER): Payer: Medicare Other | Admitting: Nurse Practitioner

## 2018-02-10 ENCOUNTER — Encounter: Payer: Self-pay | Admitting: Nurse Practitioner

## 2018-02-10 ENCOUNTER — Other Ambulatory Visit (HOSPITAL_COMMUNITY): Payer: Self-pay | Admitting: General Practice

## 2018-02-10 VITALS — BP 118/60 | HR 91 | Temp 98.5°F | Ht 76.0 in | Wt 151.2 lb

## 2018-02-10 DIAGNOSIS — N3281 Overactive bladder: Secondary | ICD-10-CM | POA: Diagnosis not present

## 2018-02-10 DIAGNOSIS — F319 Bipolar disorder, unspecified: Secondary | ICD-10-CM | POA: Diagnosis not present

## 2018-02-10 DIAGNOSIS — R269 Unspecified abnormalities of gait and mobility: Secondary | ICD-10-CM | POA: Diagnosis not present

## 2018-02-10 DIAGNOSIS — B379 Candidiasis, unspecified: Secondary | ICD-10-CM

## 2018-02-10 DIAGNOSIS — R634 Abnormal weight loss: Secondary | ICD-10-CM

## 2018-02-10 DIAGNOSIS — R2689 Other abnormalities of gait and mobility: Secondary | ICD-10-CM | POA: Diagnosis not present

## 2018-02-10 MED ORDER — MIRABEGRON ER 25 MG PO TB24: 25.0000 mg | ORAL_TABLET | Freq: Every day | ORAL | 1 refills | Status: DC

## 2018-02-10 NOTE — Patient Instructions (Addendum)
STOP oxybutynin, start myrbetriq 25 mg by mouth daily  Increase water   Follow up with [place] psychiatrist   Drink ensure daily to SUPPLEMENT a meal, does not replace   Make sure you are wearing proper shoes with backs  Follow up in 1 month.

## 2018-02-10 NOTE — Progress Notes (Signed)
Careteam: Patient Care Team: Gayland Curry, DO as PCP - General (Geriatric Medicine) Elmarie Shiley, MD (Nephrology) Milus Banister, MD (Gastroenterology)  Advanced Directive information    Allergies  Allergen Reactions  . Azathioprine     REACTION: affected WBC  . Ciprofloxacin     Pt denies any reaction  . Levaquin [Levofloxacin In D5w]     Pt denies any reaction  . Plendil [Felodipine]     Pt denies any reaction    Chief Complaint  Patient presents with  . Hospitalization Follow-up    Hospital follow up date 02/11/2018 for extremity weakness      HPI: Patient is a 72 y.o. male seen in the office today to follow up ED. He was seen due to weakness and shuffling gait and balance issues. almost has fallen several times in his apartment. He was referred to neurologist who he saw yesterday.   Pt is worried about his leg weakness and shuffling  Neurologist reviewed his MRI and CT which showed fairly age-appropriate findings and thought that overall, his gait disturbance is likely multifactorial in nature.   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 Has a PCP also at the [place] who prescribed his oxybutynin.   -age related muscle weakness- does not do much physical activity- comes in to the office wearing sandals with socks. Reports his friend plans to give him walker.   suboptimal hydration/nutrition- discussed drinking more water and making sure he is eating correctly. States he does not eat much. Having trouble with dentures, also recently treated for thrush which has improved.    Review of Systems:  Review of Systems  Constitutional: Negative for chills, fever and weight loss.  HENT: Negative for congestion and hearing loss.   Eyes: Negative for blurred vision.       Glasses  Respiratory: Negative for cough and shortness of breath.   Cardiovascular: Negative for chest pain, palpitations and leg swelling.    Gastrointestinal: Negative for abdominal pain, blood in stool, constipation, diarrhea and melena.  Genitourinary: Positive for frequency. Negative for dysuria.  Musculoskeletal: Negative for falls and joint pain.       Almost having multiple falls.   Neurological: Positive for tremors and weakness. Negative for dizziness, tingling, loss of consciousness and headaches.  Endo/Heme/Allergies: Does not bruise/bleed easily.  Psychiatric/Behavioral: Negative for depression. The patient is not nervous/anxious and does not have insomnia.     Past Medical History:  Diagnosis Date  . Abdominal pain, right lower quadrant   . Abdominal pain, unspecified site   . Acute cholecystitis   . Anemia   . Anemia, unspecified   . Anxiety   . Benign paroxysmal positional vertigo   . Bipolar I disorder, most recent episode (or current) unspecified   . Celiac disease   . Cellulitis and abscess of unspecified site   . Chronic kidney disease, stage III (moderate) (HCC)   . Cough   . Crohn's   . Depression   . Diarrhea   . Dizziness and giddiness   . Dizziness and giddiness   . Dizziness and giddiness   . Dysuria   . Edema   . Encounter for long-term (current) use of other medications   . Essential and other specified forms of tremor   . Essential hypertension, benign   . Gout 2018  . Hypertensive renal disease, benign   . Hypertrophy of prostate without urinary obstruction and other lower urinary  tract symptoms (LUTS)   . Hypopotassemia   . Impacted cerumen   . Impotence of organic origin   . Insomnia with sleep apnea, unspecified   . Insomnia with sleep apnea, unspecified   . Iron deficiency anemia, unspecified   . Loss of weight   . Malnutrition of mild degree ([place])   . 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 Nausea with vomiting   . Neuralgia, neuritis, and radiculitis, unspecified   . Other abnormal blood chemistry   . Other B-complex deficiencies   . Other B-complex deficiencies   . Other  extrapyramidal disease and abnormal movement disorder   . Other extrapyramidal disease and abnormal movement disorder   . Other malaise and fatigue   . Other specified disease of white blood cells   . Other specified disease of white blood cells   . Pain in joint, lower leg   . Pain in limb   . Postinflammatory pulmonary fibrosis ([place])   . Regional enteritis of small intestine ([place])   . Regional enteritis of small intestine with large intestine ([place])   . Renal insufficiency   . Tobacco use disorder   . Unspecified disorder of skin and subcutaneous tissue   . Vertigo 2018  . Viral warts, unspecified   . Viremia, unspecified    Past Surgical History:  Procedure Laterality Date  . CHOLECYSTECTOMY  07-12-2010  . SMALL INTESTINE SURGERY     x 2   Social History:   reports that he quit smoking about 3 years ago. His smoking use included cigarettes. He started smoking about 61 years ago. He has a 49.00 pack-year smoking history. He has never used smokeless tobacco. He reports that he does not drink alcohol or use drugs.  Family History  Problem Relation Age of Onset  . Diabetes Mother        maternal grandmother  . Uterine cancer Mother   . Emphysema Father   . Pneumonia Maternal Grandmother   . Colon cancer Neg Hx     Medications: Patient's Medications  New Prescriptions   No medications on file  Previous Medications   ALLOPURINOL (ZYLOPRIM) 100 MG TABLET    Take 2 tablets (200 mg total) by mouth daily.   AMLODIPINE (NORVASC) 2.5 MG TABLET    Take 2.5 mg by mouth daily.   CALCIUM CARBONATE-VIT D-MIN 600-400 MG-UNIT TABS    Take 1 tablet by mouth 3 (three) times daily.    CHOLECALCIFEROL (VITAMIN D) 1000 UNITS TABLET    Take 1,000 Units by mouth daily.    DIVALPROEX (DEPAKOTE) 500 MG EC TABLET    Take 1,500 mg by mouth at bedtime.    EPOETIN ALFA (PROCRIT) 41962 UNIT/ML INJECTION    Inject 10,000 Units into the skin every 30 (thirty) days.    FERROUS SULFATE 325 (65 FE) MG  TABLET    Take 325 mg by mouth 2 (two) times daily.     INFLIXIMAB (REMICADE) 100 MG INJECTION    Infuse Remicade IV schedule 1 35m/kg every 8 weeks Premedicate with Tylenol 500-6577mby mouth and Benadryl 25-5050my mouth prior to infusion. Last PPD was on 12/2009.    LOPERAMIDE HCL (LOPERAMIDE A-D PO)    Take 1 capsule by mouth as needed (loose stools).    MAGNESIUM OXIDE (MAG-OX) 400 (241.3 MG) MG TABLET    Take 2,400 mg by mouth 3 (three) times daily.    MECLIZINE (ANTIVERT) 25 MG TABLET    Take 25 mg by mouth 3 (three) times daily  as needed for dizziness.    METOPROLOL SUCCINATE (TOPROL-XL) 50 MG 24 HR TABLET    Take 50 mg by mouth daily. Take with or immediately following a meal.   MULTIPLE VITAMIN (MULTIVITAMIN) TABLET    Take 1 tablet by mouth daily.     NYSTATIN (MYCOSTATIN) 100000 UNIT/ML SUSPENSION    Use as directed 5 mLs (500,000 Units total) in the mouth or throat 4 (four) times daily.   OLANZAPINE (ZYPREXA) 7.5 MG TABLET    Take 1 tablet (7.5 mg total) by mouth at bedtime.   OMEPRAZOLE (PRILOSEC) 20 MG CAPSULE    Take 20 mg by mouth daily before supper.     OXYBUTYNIN (DITROPAN XL) 15 MG 24 HR TABLET    Take 15 mg by mouth daily.     PRAZOSIN (MINIPRESS) 5 MG CAPSULE    Take 5 mg by mouth at bedtime.   VITAMIN E 400 UNIT CAPSULE    Take 400 Units by mouth daily.    Modified Medications   No medications on file  Discontinued Medications   No medications on file     Physical Exam:  Vitals:   02/10/18 1536  BP: 118/60  Pulse: 91  Temp: 98.5 F (36.9 C)  TempSrc: Oral  SpO2: 97%  Weight: 151 lb 3.2 oz (68.6 kg)  Height: 6' 4"  (1.93 m)   Body mass index is 18.4 kg/m.  Physical Exam Constitutional:      General: He is not in acute distress.    Comments: All thin male  Cardiovascular:     Rate and Rhythm: Normal rate and regular rhythm.     Heart sounds: Normal heart sounds.  Pulmonary:     Effort: Pulmonary effort is normal. No respiratory distress.     Breath  sounds: Normal breath sounds. No stridor. No wheezing or rales.  Abdominal:     General: Bowel sounds are normal. There is no distension.     Palpations: Abdomen is soft.     Tenderness: There is no abdominal tenderness.  Musculoskeletal: Normal range of motion.  Skin:    General: Skin is warm and dry.     Capillary Refill: Capillary refill takes less than 2 seconds.  Neurological:     General: No focal deficit present.     Mental Status: He is alert and oriented to person, place, and time.     Motor: Weakness present.     Coordination: Coordination normal.     Gait: Gait abnormal (shuffling).     Deep Tendon Reflexes: Reflexes normal.     Labs reviewed: Basic Metabolic Panel: Recent Labs    05/31/17 1203  06/18/17 1214 02/07/18 1255 02/07/18 1313  NA 140   < > 141 138 139  K 4.1   < > 3.5 5.0 5.0  CL 108  --  111 104 103  CO2 24  --  23 25  --   GLUCOSE 83  --  103* 98 91  BUN 18   < > 14 29* 28*  CREATININE 1.77*   < > 1.69* 2.14* 2.20*  CALCIUM 9.0  --  8.5* 9.2  --    < > = values in this interval not displayed.   Liver Function Tests: Recent Labs    05/31/17 1203 06/11/17 06/18/17 1214 02/07/18 1255  AST 13* 9* 14* 28  ALT 7* 6* 8* 22  ALKPHOS 53 55 46 63  BILITOT 0.5  --  0.5 0.4  PROT 7.1  --  6.9 6.9  ALBUMIN 3.2*  --  3.0* 3.2*   Recent Labs    06/18/17 1214  LIPASE 31   No results for input(s): AMMONIA in the last 8760 hours. CBC: Recent Labs    05/04/17 0903  05/31/17 1203 06/11/17 06/18/17 1214  01/27/18 1113 02/07/18 1255 02/07/18 1313  WBC 8.6  --  9.5 8.3 8.8  --   --  6.2  --   NEUTROABS 4,773  --  6.2  --   --   --   --  4.4  --   HGB 11.3*   < > 11.7* 10.6* 10.0*   < > 10.6* 11.3* 12.2*  HCT 34.3*  --  37.6* 33* 31.7*  --   --  36.2* 36.0*  MCV 98.6  --  103.6*  --  102.6*  --   --  108.7*  --   PLT 180  --  190 171 187  --   --  145*  --    < > = values in this interval not displayed.   Lipid Panel: Recent Labs     05/04/17 0903  CHOL 138  HDL 42  LDLCALC 71  TRIG 176*  CHOLHDL 3.3   TSH: No results for input(s): TSH in the last 8760 hours. A1C: Lab Results  Component Value Date   HGBA1C 4.9 08/10/2015     Assessment/Plan 1. Gait abnormality and imbalance -proper foot wear reviewed with pt, he does have shuffling gait and appears to be off balance. Agree with neurologist that this is multifactorial, nutrition and medication and debility all contributing.  - Ambulatory referral to Physical Therapy for gait and strength training. - TSH  3. Bipolar 1 disorder (HCC) Mood stable however Zyprexa could be causing issues. He has planned to follow up with psychiatrist at the [facility].   4. Overactive bladder To stop oxybutynin as may be causing issue - mirabegron ER (MYRBETRIQ) 25 MG TB24 tablet; Take 1 tablet (25 mg total) by mouth daily.  Dispense: 30 tablet; Refill: 1  5. Weight loss, unintentional -reports decrease appetite and overall intake. Encouraged to increase nutrition and hydration. To make sure he is eating 3 meals a day and to take ensure or protein supplement daily. -plans to also follow up with dentist to get his dentures to fit better.  - TSH  6. Candidiasis resolved  Next appt: 4 weeks trettret Carlos [facility]. [place], [facility] [phone]

## 2018-02-11 LAB — TSH: TSH: 2.66 mIU/L (ref 0.40–4.50)

## 2018-02-18 ENCOUNTER — Ambulatory Visit: Payer: Medicare Other | Attending: Nurse Practitioner | Admitting: Physical Therapy

## 2018-02-18 ENCOUNTER — Encounter: Payer: Self-pay | Admitting: Physical Therapy

## 2018-02-18 DIAGNOSIS — R262 Difficulty in walking, not elsewhere classified: Secondary | ICD-10-CM | POA: Diagnosis not present

## 2018-02-18 DIAGNOSIS — R2689 Other abnormalities of gait and mobility: Secondary | ICD-10-CM

## 2018-02-18 DIAGNOSIS — M6281 Muscle weakness (generalized): Secondary | ICD-10-CM | POA: Diagnosis not present

## 2018-02-18 NOTE — Patient Instructions (Signed)
Access Code: [email or web address]  URL: [email or web address]  Date: 02/18/2018  Prepared by: Elsie Ra   Exercises  Backward Walking with Counter Support - 3-5 reps - 1 sets - 2x daily - 6x weekly  Side Stepping with Counter Support - 3-5 reps - 1 sets - 2x daily - 6x weekly  Standing Tandem Balance with Counter Support - 3 reps - 1 sets - 30 hold - 2x daily - 6x weekly  Standing Single Leg Stance with Counter Support - 10 reps - 3 sets - 2x daily - 6x weekly  Sit to Stand without Arm Support - 10 reps - 1-2 sets - 2x daily - 6x weekly

## 2018-02-18 NOTE — Therapy (Signed)
[facility] [address] [place], [place], [address] Phone: [phone]   Fax:  [phone]  Physical Therapy Evaluation  Patient Details  Name: Brent [name] MRN: [id number] Date of Birth: 1945-10-11 Referring Provider (PT): Sherrie Mustache, NP   Encounter Date: 02/18/2018  PT End of Session - 02/18/18 1924    Visit Number  1    Number of Visits  12    Date for PT Re-Evaluation  04/01/18    Authorization Type  [employer] MCR    PT Start Time  3149    PT Stop Time  1530    PT Time Calculation (min)  45 min    Equipment Utilized During Treatment  Gait belt    Activity Tolerance  Patient tolerated treatment well    Behavior During Therapy  [facility] for tasks assessed/performed       Past Medical History:  Diagnosis Date  . Abdominal pain, right lower quadrant   . Abdominal pain, unspecified site   . Acute cholecystitis   . Anemia   . Anemia, unspecified   . Anxiety   . Benign paroxysmal positional vertigo   . Bipolar I disorder, most recent episode (or current) unspecified   . Celiac disease   . Cellulitis and abscess of unspecified site   . Chronic kidney disease, stage III (moderate) (HCC)   . Cough   . Crohn's   . Depression   . Diarrhea   . Dizziness and giddiness   . Dizziness and giddiness   . Dizziness and giddiness   . Dysuria   . Edema   . Encounter for long-term (current) use of other medications   . Essential and other specified forms of tremor   . Essential hypertension, benign   . Gout 2018  . Hypertensive renal disease, benign   . Hypertrophy of prostate without urinary obstruction and other lower urinary tract symptoms (LUTS)   . Hypopotassemia   . Impacted cerumen   . Impotence of organic origin   . Insomnia with sleep apnea, unspecified   . Insomnia with sleep apnea, unspecified   . Iron deficiency anemia, unspecified   . Loss of weight   . Malnutrition of mild degree ([place])   . 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 Nausea with vomiting   . Neuralgia, neuritis, and radiculitis, unspecified   . Other abnormal blood chemistry   . Other B-complex deficiencies   . Other B-complex deficiencies   . Other extrapyramidal disease and abnormal movement disorder   . Other extrapyramidal disease and abnormal movement disorder   . Other malaise and fatigue   . Other specified disease of white blood cells   . Other specified disease of white blood cells   . Pain in joint, lower leg   . Pain in limb   . Postinflammatory pulmonary fibrosis ([place] )   . Regional enteritis of small intestine ([place])   . Regional enteritis of small intestine with large intestine ([place])   . Renal insufficiency   . Tobacco use disorder   . Unspecified disorder of skin and subcutaneous tissue   . Vertigo 2018  . Viral warts, unspecified   . Viremia, unspecified     Past Surgical History:  Procedure Laterality Date  . CHOLECYSTECTOMY  07-12-2010  . SMALL INTESTINE SURGERY     x 2    There were no vitals filed for this visit.   Subjective Assessment - 02/18/18 1451    Subjective  Pt relays 3 week  onset of balance and gait disturbance that just came on when he came home to his apartment and could not get out of the car. He relays he lives alone and is independent with ADL's but he is having loss of appetite and loss of balance. He relays he has RW at home but does not use it.     Pertinent History  PMH:anx/dep,BPPV,bipolar,celiac/chohn's,CKD III,dizziness,chronic leg pain    Limitations  Lifting;Standing;Walking    How long can you stand comfortably?  10 min    How long can you walk comfortably?  usuall grocery store but did have an episode last week where he could not make in through store and had to use cart    Currently in Pain?  No/denies         [facility] PT Assessment - 02/18/18 0001      Assessment   Medical Diagnosis  Gait and Balance,weakness    Referring Provider (PT)  Sherrie Mustache, NP    Onset  Date/Surgical Date  --   3 week onset of loss of balance   Hand Dominance  Right    Next MD Visit  ?    Prior Therapy  none      Precautions   Precautions  Fall      Balance Screen   Has the patient fallen in the past 6 months  Yes    How many times?  3    Has the patient had a decrease in activity level because of a fear of falling?   No    Is the patient reluctant to leave their home because of a fear of falling?   No      Home Film/video editor residence    Living Arrangements  Alone    Additional Comments  single level      Prior Function   Level of Independence  Independent with basic ADLs    Vocation  Retired    Leisure  watch TV      Cognition   Overall Cognitive Status  Within Functional Limits for tasks assessed      Coordination   Gross Motor Movements are Fluid and Coordinated  --   can be ataxic at times   Finger Nose Finger Test  mildly impaired    Heel Shin Test  mildly impaired      Posture/Postural Control   Posture Comments  fwd head, slumped posture, knee valgus      ROM / Strength   AROM / PROM / Strength  AROM;Strength      AROM   Overall AROM   Within functional limits for tasks performed      Strength   Overall Strength Comments  UE strength 4+/5 grossly, LE strength 4/5 bilat grossly tested in sitting      Transfers   Transfers  Independent with all Transfers   extra time needed   Five time sit to stand comments   23.8 sec      Ambulation/Gait   Gait Comments  slow, unsteady, shuffling (can take bigger faster steps when cued to do so)      Balance   Balance Assessed  Yes      Standardized Balance Assessment   Standardized Balance Assessment  Berg Balance Test;10 meter walk test;Timed Up and Go Test    10 Meter Walk  11.9      Berg Balance Test   Sit to Stand  Able to stand  independently using  hands    Standing Unsupported  Able to stand 2 minutes with supervision    Sitting with Back Unsupported but Feet  Supported on Floor or Stool  Able to sit safely and securely 2 minutes    Stand to Sit  Controls descent by using hands    Transfers  Able to transfer safely, definite need of hands    Standing Unsupported with Eyes Closed  Able to stand 10 seconds with supervision    Standing Ubsupported with Feet Together  Able to place feet together independently and stand for 1 minute with supervision    From Standing, Reach Forward with Outstretched Arm  Reaches forward but needs supervision    From Standing Position, Pick up Object from [place] to pick up shoe, needs supervision    From Standing Position, Turn to Look Behind Over each Shoulder  Looks behind one side only/other side shows less weight shift    Turn 360 Degrees  Needs close supervision or verbal cueing    Standing Unsupported, Alternately Place Feet on Step/Stool  Able to complete 4 steps without aid or supervision    Standing Unsupported, One Foot in [employer] balance while stepping or standing    Standing on One Leg  Tries to lift leg/unable to hold 3 seconds but remains standing independently    Total Score  33      Timed Up and Go Test   TUG  Normal TUG    Normal TUG (seconds)  13.5                Objective measurements completed on examination: See above findings.              PT Education - 02/18/18 1924    Education Details  exam findings, PT POC and rationale, HEP    Person(s) Educated  Patient    Methods  Explanation;Demonstration;Verbal cues;Handout    Comprehension  Verbalized understanding;Need further instruction          PT Long Term Goals - 02/18/18 1935      PT LONG TERM GOAL #1   Title  Pt will be I and compliant with HEP. 6 weeks 04/01/18    Status  New      PT LONG TERM GOAL #2   Title  Pt will improve 5TSTS to less than 18 sec to show improved balance and endurance.  6 weeks 04/01/18    Status  New      PT LONG TERM GOAL #3   Title  Pt will improve gait speed in 10MWT <10 sec.  6 weeks 04/01/18    Status  New      PT LONG TERM GOAL #4   Title  Pt will improve BERG to at least 46 to show improved balance and decreased risk for falls. 6 weeks 04/01/18    Status  New      PT LONG TERM GOAL #5   Title  Pt will be able to ambulate 750 feet on varrying surfaces with LRAD and negotiate curb and vary speeds with functional gait pattern. 6 weeks 04/01/18    Status  New             Plan - 02/18/18 1926    Clinical Impression Statement  Pt presents with gait and balance deficits with insideous onset over the last 3 weeks. He relays his balance is inconsistent and at times he feels unsteady and shuffles his feet and other times he  feels he walks normal. MRI and CT which showed "fairly age-appropriate findings and thought that overall, his gait disturbance is likely multifactorial in nature." He has decreased LE strength, decreased balance on standardized tests today that place him at an increased risk for falls, decreased muscular endurance, and difficulty walking that at times seems ataxic and shuffling gait pattern. He will benefit from skilled PT to address his deficits.     History and Personal Factors relevant to plan of care:  PMH:anx/dep,BPPV,bipolar,celiac/chohn's,CKD III,dizziness,chronic leg pain    Clinical Presentation  Evolving    Clinical Presentation due to:  worsening balance over last 3 weeks    Clinical Decision Making  Moderate    Rehab Potential  Good    PT Frequency  2x / week    PT Duration  6 weeks    PT Treatment/Interventions  Moist Heat;Therapeutic exercise;Balance training;Neuromuscular re-education;Manual techniques;Passive range of motion    PT Next Visit Plan  review HEP, progress balance and gait and LE strength/endurance as able, consider DGI or FGA    PT Home Exercise Plan  [email or web address]     Consulted and Agree with Plan of Care  Patient       Patient will benefit from skilled therapeutic intervention in order to improve the following deficits  and impairments:  Abnormal gait, Decreased activity tolerance, Decreased balance, Decreased endurance, Decreased safety awareness, Decreased range of motion, Difficulty walking, Decreased strength, Postural dysfunction  Visit Diagnosis: Other abnormalities of gait and mobility  Difficulty in walking, not elsewhere classified  Muscle weakness (generalized)     Problem List Patient Active Problem List   Diagnosis Date Noted  . Candida esophagitis ([place]) 09/22/2017  . History of smoking 30 or more pack years 01/05/2017  . Bipolar 1 disorder ([place])   . BPH (benign prostatic hyperplasia) 03/31/2013  . 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 Essential hypertension, benign 03/31/2013  . Chronic kidney disease (CKD), stage III (moderate) ([place]) 06/04/2012  . Crohn's regional enteritis [facility] 01/23/2010    Silvestre Mesi 02/18/2018, 7:40 PM  [facility] [address] [place], [place], [address] Phone: [phone]   Fax:  [phone]  Name: PRESLEY SUMMERLIN MRN: [id number] Date of Birth: 04/17/1945

## 2018-02-26 ENCOUNTER — Encounter: Payer: Self-pay | Admitting: Physical Therapy

## 2018-02-26 ENCOUNTER — Encounter (HOSPITAL_COMMUNITY): Payer: Self-pay

## 2018-02-26 ENCOUNTER — Ambulatory Visit: Payer: Medicare Other | Attending: Nurse Practitioner | Admitting: Physical Therapy

## 2018-02-26 ENCOUNTER — Encounter (HOSPITAL_COMMUNITY)
Admission: RE | Admit: 2018-02-26 | Discharge: 2018-02-26 | Disposition: A | Discharge status: Home / Self Care | Payer: Medicare Other | Source: Ambulatory Visit | Attending: Gastroenterology | Admitting: Gastroenterology

## 2018-02-26 DIAGNOSIS — R262 Difficulty in walking, not elsewhere classified: Secondary | ICD-10-CM | POA: Diagnosis not present

## 2018-02-26 DIAGNOSIS — D6489 Other specified anemias: Secondary | ICD-10-CM | POA: Insufficient documentation

## 2018-02-26 DIAGNOSIS — M6281 Muscle weakness (generalized): Secondary | ICD-10-CM | POA: Insufficient documentation

## 2018-02-26 DIAGNOSIS — N183 Chronic kidney disease, stage 3 (moderate): Secondary | ICD-10-CM | POA: Diagnosis not present

## 2018-02-26 DIAGNOSIS — R2689 Other abnormalities of gait and mobility: Secondary | ICD-10-CM | POA: Insufficient documentation

## 2018-02-26 LAB — HEMOGLOBIN: Hemoglobin: 11 g/dL — ABNORMAL LOW (ref 13.0–17.0)

## 2018-02-26 LAB — FERRITIN: Ferritin: 957 ng/mL — ABNORMAL HIGH (ref 24–336)

## 2018-02-26 LAB — IRON AND TIBC
Iron: 44 ug/dL — ABNORMAL LOW (ref 45–182)
Saturation Ratios: 19 % (ref 17.9–39.5)
TIBC: 232 ug/dL — ABNORMAL LOW (ref 250–450)
UIBC: 188 ug/dL

## 2018-02-26 MED ORDER — EPOETIN ALFA 10000 UNIT/ML IJ SOLN
10000.0000 [IU] | INTRAMUSCULAR | Status: AC
  Administered 2018-02-26: 10000 [IU] via SUBCUTANEOUS
  Filled 2018-02-26 (×2): qty 1

## 2018-02-26 NOTE — Discharge Instructions (Signed)
Epoetin Alfa injection What is this medicine? EPOETIN ALFA (e POE e tin AL fa) helps your body make more red blood cells. This medicine is used to treat anemia caused by chronic kidney disease, cancer chemotherapy, or HIV-therapy. It may also be used before surgery if you have anemia. This medicine may be used for other purposes; ask your health care provider or pharmacist if you have questions. COMMON BRAND NAME(S): Epogen, Procrit, Retacrit What should I tell my health care provider before I take this medicine? They need to know if you have any of these conditions: -cancer -heart disease -high blood pressure -history of blood clots -history of stroke -low levels of folate, iron, or vitamin B12 in the blood -seizures -an unusual or allergic reaction to erythropoietin, albumin, benzyl alcohol, hamster proteins, other medicines, foods, dyes, or preservatives -pregnant or trying to get pregnant -breast-feeding How should I use this medicine? This medicine is for injection into a vein or under the skin. It is usually given by a health care professional in a hospital or clinic setting. If you get this medicine at home, you will be taught how to prepare and give this medicine. Use exactly as directed. Take your medicine at regular intervals. Do not take your medicine more often than directed. It is important that you put your used needles and syringes in a special sharps container. Do not put them in a trash can. If you do not have a sharps container, call your pharmacist or healthcare provider to get one. A special MedGuide will be given to you by the pharmacist with each prescription and refill. Be sure to read this information carefully each time. Talk to your pediatrician regarding the use of this medicine in children. While this drug may be prescribed for selected conditions, precautions do apply. Overdosage: If you think you have taken too much of this medicine contact a poison control center  or emergency room at once. NOTE: This medicine is only for you. Do not share this medicine with others. What if I miss a dose? If you miss a dose, take it as soon as you can. If it is almost time for your next dose, take only that dose. Do not take double or extra doses. What may interact with this medicine? Interactions have not been studied. This list may not describe all possible interactions. Give your health care provider a list of all the medicines, herbs, non-prescription drugs, or dietary supplements you use. Also tell them if you smoke, drink alcohol, or use illegal drugs. Some items may interact with your medicine. What should I watch for while using this medicine? Your condition will be monitored carefully while you are receiving this medicine. You may need blood work done while you are taking this medicine. This medicine may cause a decrease in vitamin B6. You should make sure that you get enough vitamin B6 while you are taking this medicine. Discuss the foods you eat and the vitamins you take with your health care professional. What side effects may I notice from receiving this medicine? Side effects that you should report to your doctor or health care professional as soon as possible: -allergic reactions like skin rash, itching or hives, swelling of the face, lips, or tongue -seizures -signs and symptoms of a blood clot such as breathing problems; changes in vision; chest pain; severe, sudden headache; pain, swelling, warmth in the leg; trouble speaking; sudden numbness or weakness of the face, arm or leg -signs and symptoms of a stroke  like changes in vision; confusion; trouble speaking or understanding; severe headaches; sudden numbness or weakness of the face, arm or leg; trouble walking; dizziness; loss of balance or coordination Side effects that usually do not require medical attention (report to your doctor or health care professional if they continue or are  bothersome): -chills -cough -dizziness -fever -headaches -joint pain -muscle cramps -muscle pain -nausea, vomiting -pain, redness, or irritation at site where injected This list may not describe all possible side effects. Call your doctor for medical advice about side effects. You may report side effects to FDA at 1-800-FDA-1088. Where should I keep my medicine? Keep out of the reach of children. Store in a refrigerator between 2 and 8 degrees C (36 and 46 degrees F). Do not freeze or shake. Throw away any unused portion if using a single-dose vial. Multi-dose vials can be kept in the refrigerator for up to 21 days after the initial dose. Throw away unused medicine. NOTE: This sheet is a summary. It may not cover all possible information. If you have questions about this medicine, talk to your doctor, pharmacist, or health care provider.  2019 Elsevier/Gold Standard (2016-09-19 08:35:19)

## 2018-02-26 NOTE — Therapy (Signed)
[facility] [address] [place], [place], [address] Phone: [phone]   Fax:  [phone]  Physical Therapy Treatment  Patient Details  Name: Brent [name] MRN: [id number] Date of Birth: 05/09/1945 Referring Provider (Brent [name]): Sherrie Mustache, NP   Encounter Date: 02/26/2018  Brent [name] End of Session - 02/26/18 1455    Visit Number  2    Number of Visits  12    Date for Brent [name] Re-Evaluation  04/01/18    Authorization Type  U[employer] MCR    Brent [name] Start Time  1400    Brent [name] Stop Time  4854    Brent [name] Time Calculation (min)  45 min    Activity Tolerance  Patient tolerated treatment well    Behavior During Therapy  [facility] for tasks assessed/performed       Past Medical History:  Diagnosis Date  . Abdominal pain, right lower quadrant   . Abdominal pain, unspecified site   . Acute cholecystitis   . Anemia   . Anemia, unspecified   . Anxiety   . Benign paroxysmal positional vertigo   . Bipolar I disorder, most recent episode (or current) unspecified   . Celiac disease   . Cellulitis and abscess of unspecified site   . Chronic kidney disease, stage III (moderate) (HCC)   . Cough   . Crohn's   . Depression   . Diarrhea   . Dizziness and giddiness   . Dizziness and giddiness   . Dizziness and giddiness   . Dysuria   . Edema   . Encounter for long-term (current) use of other medications   . Essential and other specified forms of tremor   . Essential hypertension, benign   . Gout 2018  . Hypertensive renal disease, benign   . Hypertrophy of prostate without urinary obstruction and other lower urinary tract symptoms (LUTS)   . Hypopotassemia   . Impacted cerumen   . Impotence of organic origin   . Insomnia with sleep apnea, unspecified   . Insomnia with sleep apnea, unspecified   . Iron deficiency anemia, unspecified   . Loss of weight   . Malnutrition of mild degree ([place])   . 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 Nausea with vomiting   . Neuralgia,  neuritis, and radiculitis, unspecified   . Other abnormal blood chemistry   . Other B-complex deficiencies   . Other B-complex deficiencies   . Other extrapyramidal disease and abnormal movement disorder   . Other extrapyramidal disease and abnormal movement disorder   . Other malaise and fatigue   . Other specified disease of white blood cells   . Other specified disease of white blood cells   . Pain in joint, lower leg   . Pain in limb   . Postinflammatory pulmonary fibrosis ([place])   . Regional enteritis of small intestine ([place])   . Regional enteritis of small intestine with large intestine ([place])   . Renal insufficiency   . Tobacco use disorder   . Unspecified disorder of skin and subcutaneous tissue   . Vertigo 2018  . Viral warts, unspecified   . Viremia, unspecified     Past Surgical History:  Procedure Laterality Date  . CHOLECYSTECTOMY  07-12-2010  . SMALL INTESTINE SURGERY     x 2    There were no vitals filed for this visit.  Subjective Assessment - 02/26/18 1408    Subjective  Brent [name] was taken off of two medications and has seen a huge difference in his  balance, strength and mobility.   Has been performing his HEP.    Pertinent History  PMH:anx/dep,BPPV,bipolar,celiac/chohn's,CKD III,dizziness,chronic leg pain    Limitations  Lifting;Standing;Walking    How long can you stand comfortably?  10 min    How long can you walk comfortably?  usuall grocery store but did have an episode last week where he could not make in through store and had to use cart    Currently in Pain?  No/denies         [facility] Brent [name] Assessment - 02/26/18 1410      Functional Gait  Assessment   Gait assessed   Yes    Gait Level Surface  Walks 20 ft in less than 5.5 sec, no assistive devices, good speed, no evidence for imbalance, normal gait pattern, deviates no more than 6 in outside of the 12 in walkway width.    Change in Gait Speed  Able to smoothly change walking speed without loss of balance or  gait deviation. Deviate no more than 6 in outside of the 12 in walkway width.    Gait with Horizontal Head Turns  Performs head turns smoothly with no change in gait. Deviates no more than 6 in outside 12 in walkway width    Gait with Vertical Head Turns  Performs head turns with no change in gait. Deviates no more than 6 in outside 12 in walkway width.    Gait and Pivot Turn  Pivot turns safely within 3 sec and stops quickly with no loss of balance.    Step Over Obstacle  Is able to step over 2 stacked shoe boxes taped together (9 in total height) without changing gait speed. No evidence of imbalance.    Gait with Narrow Base of Support  Ambulates less than 4 steps heel to toe or cannot perform without assistance.    Gait with Eyes Closed  Walks 20 ft, no assistive devices, good speed, no evidence of imbalance, normal gait pattern, deviates no more than 6 in outside 12 in walkway width. Ambulates 20 ft in less than 7 sec.    Ambulating Backwards  Walks 20 ft, slow speed, abnormal gait pattern, evidence for imbalance, deviates 10-15 in outside 12 in walkway width.    Steps  Alternating feet, must use rail.    Total Score  24    FGA comment:  24/30                        Balance Exercises - 02/26/18 1454      Balance Exercises: Standing   Standing Eyes Opened  Narrow base of support (BOS);Head turns;Solid surface;Other reps (comment)   10 reps   Standing Eyes Closed  Narrow base of support (BOS);Head turns;Solid surface;Other reps (comment)   10 reps      Access Code: [email or web address]  URL: [email or web address]  Date: 02/26/2018  Prepared by: Misty Stanley   Exercises  Backward Walking with Counter Support - 4 reps - 1 sets - 2x daily - 6x weekly  Standing Single Leg Stance with Counter Support - 4 reps - 10 seconds hold - 2x daily - 6x weekly  Tandem Walking with Counter Support - 4 sets - 1x daily - 7x weekly  Sit to Stand without Arm Support - 10 reps - 2  sets - 2x daily - 6x weekly  Standing Hip Extension - 10 reps - 2 sets - 1x daily - 7x weekly  Supine Bridge - 10-12 reps -  1 sets - 2x daily - 7x weekly     Brent [name] Education - 02/26/18 1455    Education Details  findings of FGA; updates to HEP    Person(s) Educated  Patient    Methods  Explanation;Demonstration;Handout    Comprehension  Verbalized understanding;Returned demonstration          Brent [name] Long Term Goals - 02/26/18 1456      Brent [name] LONG TERM GOAL #1   Title  Brent [name] will be I and compliant with HEP. 6 weeks 04/01/18    Status  New      Brent [name] LONG TERM GOAL #2   Title  Brent [name] will improve 5TSTS to less than 18 sec to show improved balance and endurance.  6 weeks 04/01/18    Status  New      Brent [name] LONG TERM GOAL #3   Title  Brent [name] will improve gait speed in 10MWT <10 sec. 6 weeks 04/01/18    Status  New      Brent [name] LONG TERM GOAL #4   Title  Brent [name] will improve BERG to at least 46 to show improved balance and decreased risk for falls. 6 weeks 04/01/18    Status  New      Brent [name] LONG TERM GOAL #5   Title  Brent [name] will be able to ambulate 750 feet on varrying surfaces with LRAD and negotiate curb and vary speeds with functional gait pattern. 6 weeks 04/01/18    Status  New      Additional Long Term Goals   Additional Long Term Goals  Yes      Brent [name] LONG TERM GOAL #6   Title  Brent [name] will improve FGA score by 3 points to indicate decreased risk for falls    Baseline  24/30    Status  New            Plan - 02/26/18 1457    Clinical Impression Statement  Performed FGA to assess safety with gait and falls risk with higher level gait challenges; Brent [name] demonstrates impairments with backwards walking, walking with narrow BOS and stair negotiation.  Brent [name] is demonstrating significant improvement in gait, balance and mobility over the past two weeks.  Due to improvements, reviewed current HEP and upgraded exercises.  Added two new exercises to continue to address proximal hip and knee strengthening.    Rehab Potential  Good    Brent [name]  Frequency  2x / week    Brent [name] Duration  6 weeks    Brent [name] Treatment/Interventions  Moist Heat;Therapeutic exercise;Balance training;Neuromuscular re-education;Manual techniques;Passive range of motion    Brent [name] Next Visit Plan  Continue to work on LE strength, stair negotiation, balance with narrow BOS and on compliant surfaces    Brent [name] Home Exercise Plan  [email or web address]     Consulted and Agree with Plan of Care  Patient       Patient will benefit from skilled therapeutic intervention in order to improve the following deficits and impairments:  Abnormal gait, Decreased activity tolerance, Decreased balance, Decreased endurance, Decreased safety awareness, Decreased range of motion, Difficulty walking, Decreased strength, Postural dysfunction  Visit Diagnosis: Other abnormalities of gait and mobility  Difficulty in walking, not elsewhere classified  Muscle weakness (generalized)     Problem List Patient Active Problem List   Diagnosis Date Noted  . Candida esophagitis ([place]) 09/22/2017  . History of smoking 30 or more pack years 01/05/2017  . Bipolar 1 disorder ([place])   . BPH (benign prostatic hyperplasia) 03/31/2013  .  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 Essential hypertension, benign 03/31/2013  . Chronic kidney disease (CKD), stage III (moderate) ([place]) 06/04/2012  . 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[place]) 01/23/2010   Brent [name], Brent [name], Brent [name] 02/26/18    3:02 PM    [facility] [address] [place], [place], [address] Phone: [phone]   Fax:  [phone]  Name: Brent [name] MRN: [id number] Date of Birth: 1945-10-16

## 2018-02-26 NOTE — Patient Instructions (Addendum)
Access Code: [email or web address]  URL: [email or web address]  Date: 02/26/2018  Prepared by: Misty Stanley   Exercises  Backward Walking with Counter Support - 4 reps - 1 sets - 2x daily - 6x weekly  Standing Single Leg Stance with Counter Support - 4 reps - 10 seconds hold - 2x daily - 6x weekly  Tandem Walking with Counter Support - 4 sets - 1x daily - 7x weekly  Sit to Stand without Arm Support - 10 reps - 2 sets - 2x daily - 6x weekly  Standing Hip Extension - 10 reps - 2 sets - 1x daily - 7x weekly  Supine Bridge - 10-12 reps - 1 sets - 2x daily - 7x weekly

## 2018-03-01 ENCOUNTER — Encounter (HOSPITAL_COMMUNITY): Payer: Medicare Other

## 2018-03-03 ENCOUNTER — Encounter: Payer: Self-pay | Admitting: Physical Therapy

## 2018-03-03 ENCOUNTER — Ambulatory Visit: Payer: Medicare Other | Admitting: Physical Therapy

## 2018-03-03 DIAGNOSIS — R2689 Other abnormalities of gait and mobility: Secondary | ICD-10-CM | POA: Diagnosis not present

## 2018-03-03 DIAGNOSIS — M6281 Muscle weakness (generalized): Secondary | ICD-10-CM | POA: Diagnosis not present

## 2018-03-03 DIAGNOSIS — R262 Difficulty in walking, not elsewhere classified: Secondary | ICD-10-CM

## 2018-03-03 NOTE — Therapy (Signed)
[facility] [address] [place], [place], [address] Phone: [phone]   Fax:  [phone]  Physical Therapy Treatment  Patient Details  Name: Brent [name] MRN: [id number] Date of Birth: 1945-08-05 Referring Provider (PT): Sherrie Mustache, NP   Encounter Date: 03/03/2018  PT End of Session - 03/03/18 1530    Visit Number  3    Number of Visits  12    Date for PT Re-Evaluation  04/01/18    Authorization Type  UHC MCR    PT Start Time  8563    PT Stop Time  1530    PT Time Calculation (min)  45 min    Equipment Utilized During Treatment  Gait belt    Activity Tolerance  Patient tolerated treatment well       Past Medical History:  Diagnosis Date  . Abdominal pain, right lower quadrant   . Abdominal pain, unspecified site   . Acute cholecystitis   . Anemia   . Anemia, unspecified   . Anxiety   . Benign paroxysmal positional vertigo   . Bipolar I disorder, most recent episode (or current) unspecified   . Celiac disease   . Cellulitis and abscess of unspecified site   . Chronic kidney disease, stage III (moderate) (HCC)   . Cough   . Crohn's   . Depression   . Diarrhea   . Dizziness and giddiness   . Dizziness and giddiness   . Dizziness and giddiness   . Dysuria   . Edema   . Encounter for long-term (current) use of other medications   . Essential and other specified forms of tremor   . Essential hypertension, benign   . Gout 2018  . Hypertensive renal disease, benign   . Hypertrophy of prostate without urinary obstruction and other lower urinary tract symptoms (LUTS)   . Hypopotassemia   . Impacted cerumen   . Impotence of organic origin   . Insomnia with sleep apnea, unspecified   . Insomnia with sleep apnea, unspecified   . Iron deficiency anemia, unspecified   . Loss of weight   . Malnutrition of mild degree ([place])   . 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 Nausea with vomiting   . Neuralgia, neuritis, and  radiculitis, unspecified   . Other abnormal blood chemistry   . Other B-complex deficiencies   . Other B-complex deficiencies   . Other extrapyramidal disease and abnormal movement disorder   . Other extrapyramidal disease and abnormal movement disorder   . Other malaise and fatigue   . Other specified disease of white blood cells   . Other specified disease of white blood cells   . Pain in joint, lower leg   . Pain in limb   . Postinflammatory pulmonary fibrosis ([place])   . Regional enteritis of small intestine ([place])   . Regional enteritis of small intestine with large intestine ([place])   . Renal insufficiency   . Tobacco use disorder   . Unspecified disorder of skin and subcutaneous tissue   . Vertigo 2018  . Viral warts, unspecified   . Viremia, unspecified     Past Surgical History:  Procedure Laterality Date  . CHOLECYSTECTOMY  07-12-2010  . SMALL INTESTINE SURGERY     x 2    There were no vitals filed for this visit.  Subjective Assessment - 03/03/18 1504    Subjective  Pt was taken off of two medications and has seen a huge difference in his balance,  strength and mobility.   Has been performing his HEP.  able to get up much better    Pertinent History  PMH:anx/dep,BPPV,bipolar,celiac/chohn's,CKD III,dizziness,chronic leg pain    Currently in Pain?  No/denies                       [facility] Adult PT Treatment/Exercise - 03/03/18 0001      Transfers   Five time sit to stand comments   20 seconds       Ambulation/Gait   Ambulation/Gait  Yes   3.06 ft/sec   Ambulation Distance (Feet)  430 Feet    Assistive device  None    Gait Pattern  Decreased arm swing - right;Decreased arm swing - left;Step-through pattern;Decreased trunk rotation;Trunk flexed    Ambulation Surface  Level    Gait velocity  3.06 ft/sec       Neuromuscular re-ed  amb 430 ' with verbal and visual ques for head /eye /body disassociation; turning head to look at playing  cards      PT Education - 03/03/18 1536    Education Details  focus on posture with current HEP    Person(s) Educated  Patient    Methods  Explanation;Demonstration    Comprehension  Verbalized understanding;Returned demonstration          PT Long Term Goals - 02/26/18 1456      PT LONG TERM GOAL #1   Title  Pt will be I and compliant with HEP. 6 weeks 04/01/18    Status  New      PT LONG TERM GOAL #2   Title  Pt will improve 5TSTS to less than 18 sec to show improved balance and endurance.  6 weeks 04/01/18    Status  New      PT LONG TERM GOAL #3   Title  Pt will improve gait speed in 10MWT <10 sec. 6 weeks 04/01/18    Status  New      PT LONG TERM GOAL #4   Title  Pt will improve BERG to at least 46 to show improved balance and decreased risk for falls. 6 weeks 04/01/18    Status  New      PT LONG TERM GOAL #5   Title  Pt will be able to ambulate 750 feet on varrying surfaces with LRAD and negotiate curb and vary speeds with functional gait pattern. 6 weeks 04/01/18    Status  New      Additional Long Term Goals   Additional Long Term Goals  Yes      PT LONG TERM GOAL #6   Title  Pt will improve FGA score by 3 points to indicate decreased risk for falls    Baseline  24/30    Status  New            Plan - 03/03/18 1531    Clinical Impression Statement  Pt was able to improve his sit to stand score today and he was fair on showing his ability to do the HEP  needed minimal reminders; we worked on head/eye/body disassociation with ambulation; no loss of balance; he tends to walk in a flexed posture; with verbal ques he can correct but he feels unsteady unless he's looking down;  qued to keep posture tall during the HEP which was helpful.     Rehab Potential  Good    PT Frequency  2x / week    PT Duration  6 weeks  PT Treatment/Interventions  Moist Heat;Therapeutic exercise;Balance training;Neuromuscular re-education;Manual techniques;Passive range of motion    PT  Next Visit Plan  Continue to work on LE strength, stair negotiation, balance with narrow BOS and on compliant surfaces;  suggest we add 2-3 LSVT stepping ex's to HEP    PT Home Exercise Plan  [email or web address]        Patient will benefit from skilled therapeutic intervention in order to improve the following deficits and impairments:  Abnormal gait, Decreased activity tolerance, Decreased balance, Decreased endurance, Decreased safety awareness, Decreased range of motion, Difficulty walking, Decreased strength, Postural dysfunction  Visit Diagnosis: Other abnormalities of gait and mobility  Difficulty in walking, not elsewhere classified  Muscle weakness (generalized)     Problem List Patient Active Problem List   Diagnosis Date Noted  . Candida esophagitis ([place]) 09/22/2017  . History of smoking 30 or more pack years 01/05/2017  . Bipolar 1 disorder ([place])   . BPH (benign prostatic hyperplasia) 03/31/2013  . 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 Essential hypertension, benign 03/31/2013  . Chronic kidney disease (CKD), stage III (moderate) ([place]) 06/04/2012  . 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[place]) 01/23/2010    Rosaura Carpenter D PT DPT  03/03/2018, 3:37 PM  [facility] [address] [place], [place], [address] Phone: [phone]   Fax:  [phone]  Name: RAMAN FEATHERSTON MRN: [id number] Date of Birth: Mar 20, 1945

## 2018-03-10 ENCOUNTER — Ambulatory Visit: Payer: Medicare Other | Admitting: Physical Therapy

## 2018-03-10 ENCOUNTER — Encounter: Payer: Self-pay | Admitting: Physical Therapy

## 2018-03-10 DIAGNOSIS — R262 Difficulty in walking, not elsewhere classified: Secondary | ICD-10-CM | POA: Diagnosis not present

## 2018-03-10 DIAGNOSIS — R2689 Other abnormalities of gait and mobility: Secondary | ICD-10-CM

## 2018-03-10 DIAGNOSIS — M6281 Muscle weakness (generalized): Secondary | ICD-10-CM

## 2018-03-10 NOTE — Therapy (Signed)
[facility] [address] [place], [place], [address] Phone: [phone]   Fax:  [phone]  Physical Therapy Treatment  Patient Details  Name: Brent [name] MRN: [id number] Date of Birth: 26-Jun-1945 Referring Provider (PT): Sherrie Mustache, NP   Encounter Date: 03/10/2018  PT End of Session - 03/10/18 1539    Visit Number  4    Number of Visits  12    Date for PT Re-Evaluation  04/01/18    Authorization Type  [employer] MCR - 10th visit PN    PT Start Time  9892    PT Stop Time  1533    PT Time Calculation (min)  39 min    Activity Tolerance  Patient tolerated treatment well    Behavior During Therapy  Flat affect       Past Medical History:  Diagnosis Date  . Abdominal pain, right lower quadrant   . Abdominal pain, unspecified site   . Acute cholecystitis   . Anemia   . Anemia, unspecified   . Anxiety   . Benign paroxysmal positional vertigo   . Bipolar I disorder, most recent episode (or current) unspecified   . Celiac disease   . Cellulitis and abscess of unspecified site   . Chronic kidney disease, stage III (moderate) (HCC)   . Cough   . Crohn's   . Depression   . Diarrhea   . Dizziness and giddiness   . Dizziness and giddiness   . Dizziness and giddiness   . Dysuria   . Edema   . Encounter for long-term (current) use of other medications   . Essential and other specified forms of tremor   . Essential hypertension, benign   . Gout 2018  . Hypertensive renal disease, benign   . Hypertrophy of prostate without urinary obstruction and other lower urinary tract symptoms (LUTS)   . Hypopotassemia   . Impacted cerumen   . Impotence of organic origin   . Insomnia with sleep apnea, unspecified   . Insomnia with sleep apnea, unspecified   . Iron deficiency anemia, unspecified   . Loss of weight   . Malnutrition of mild degree ([place])   . 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 Nausea with vomiting   . Neuralgia,  neuritis, and radiculitis, unspecified   . Other abnormal blood chemistry   . Other B-complex deficiencies   . Other B-complex deficiencies   . Other extrapyramidal disease and abnormal movement disorder   . Other extrapyramidal disease and abnormal movement disorder   . Other malaise and fatigue   . Other specified disease of white blood cells   . Other specified disease of white blood cells   . Pain in joint, lower leg   . Pain in limb   . Postinflammatory pulmonary fibrosis ([place])   . Regional enteritis of small intestine ([place])   . Regional enteritis of small intestine with large intestine ([place])   . Renal insufficiency   . Tobacco use disorder   . Unspecified disorder of skin and subcutaneous tissue   . Vertigo 2018  . Viral warts, unspecified   . Viremia, unspecified     Past Surgical History:  Procedure Laterality Date  . CHOLECYSTECTOMY  07-12-2010  . SMALL INTESTINE SURGERY     x 2    There were no vitals filed for this visit.  Subjective Assessment - 03/10/18 1456    Subjective  Pt continues to report improvement; saw psychiatrist yesterday and she was fine with  him stopping the medication. Tried SLS at home and is still having a hard time with it.    Pertinent History  PMH:anx/dep,BPPV,bipolar,celiac/chohn's,CKD III,dizziness,chronic leg pain    Limitations  Lifting;Standing;Walking    How long can you stand comfortably?  10 min    How long can you walk comfortably?  usuall grocery store but did have an episode last week where he could not make in through store and had to use cart    Currently in Pain?  No/denies                       [facility] Adult PT Treatment/Exercise - 03/10/18 1504      Exercises   Exercises  Knee/Hip      Knee/Hip Exercises: Standing   Lateral Step Up  Right;Left;1 set;10 reps;Hand Hold: 2;Step Height: 4"    Lateral Step Up Limitations  verbal cues for posture, weight shifting and sequencing    Forward Step Up  Right;Left;1  set;10 reps;Hand Hold: 1;Step Height: 4"    Forward Step Up Limitations  total verbal, tactile and visual cues to shift weight over LE and for full LE extension; also required cues to bring contralateral LE up into SLS and then return to floor in one motion    Step Down  Right;Left;1 set;10 reps;Hand Hold: 1;Step Height: 4"    Step Down Limitations  step down to target to improve forward weight shift and heel clearance on edge of step          Balance Exercises - 03/10/18 1531      Balance Exercises: Standing   Step Over Hurdles / Cones  Alternating step overs on lower obstacle with one UE support and therapist's min-mod A for balance and upright posture        PT Education - 03/10/18 1539    Education Details  safe step negotiation    Person(s) Educated  Patient    Methods  Explanation;Demonstration    Comprehension  Need further instruction          PT Long Term Goals - 02/26/18 1456      PT LONG TERM GOAL #1   Title  Pt will be I and compliant with HEP. 6 weeks 04/01/18    Status  New      PT LONG TERM GOAL #2   Title  Pt will improve 5TSTS to less than 18 sec to show improved balance and endurance.  6 weeks 04/01/18    Status  New      PT LONG TERM GOAL #3   Title  Pt will improve gait speed in 10MWT <10 sec. 6 weeks 04/01/18    Status  New      PT LONG TERM GOAL #4   Title  Pt will improve BERG to at least 46 to show improved balance and decreased risk for falls. 6 weeks 04/01/18    Status  New      PT LONG TERM GOAL #5   Title  Pt will be able to ambulate 750 feet on varrying surfaces with LRAD and negotiate curb and vary speeds with functional gait pattern. 6 weeks 04/01/18    Status  New      Additional Long Term Goals   Additional Long Term Goals  Yes      PT LONG TERM GOAL #6   Title  Pt will improve FGA score by 3 points to indicate decreased risk for falls    Baseline  24/30    Status  New            Plan - 03/10/18 1539    Clinical Impression  Statement  Performed multiple repetitions of forwards and lateral step ups, forward step downs and stepping over low obstacle to continue to focus on weight shifting, functional SLS, LE strengthening and postural control to improve safety with stair negotiation, obstacle negotiation and to increase foot clearance during gait.  Pt continues to require ongoing verbal, tactile and visual cues to perform correctly and safely.  Will continue to address to progress towards LTG.    Rehab Potential  Good    PT Frequency  2x / week    PT Duration  6 weeks    PT Treatment/Interventions  Moist Heat;Therapeutic exercise;Balance training;Neuromuscular re-education;Manual techniques;Passive range of motion    PT Next Visit Plan  Continue to work on LE strength, stair negotiation, balance with narrow BOS and on compliant surfaces;  Try some of the PWR moves for weight shifting and stepping.      PT Home Exercise Plan  [id number]        Patient will benefit from skilled therapeutic intervention in order to improve the following deficits and impairments:  Abnormal gait, Decreased activity tolerance, Decreased balance, Decreased endurance, Decreased safety awareness, Decreased range of motion, Difficulty walking, Decreased strength, Postural dysfunction  Visit Diagnosis: Other abnormalities of gait and mobility  Difficulty in walking, not elsewhere classified  Muscle weakness (generalized)     Problem List Patient Active Problem List   Diagnosis Date Noted  . Candida esophagitis ([place]) 09/22/2017  . History of smoking 30 or more pack years 01/05/2017  . Bipolar 1 disorder ([place])   . BPH (benign prostatic hyperplasia) 03/31/2013  . 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 Essential hypertension, benign 03/31/2013  . Chronic kidney disease (CKD), stage III (moderate) ([place]) 06/04/2012  . Crohn's regional enteritis [facility] 01/23/2010    Rico Junker, PT, DPT 03/10/18    3:46 PM    [facility] [address] [place], [place], [address] Phone: [phone]   Fax:  [phone]  Name: JERRIT HOREN MRN: [id number] Date of Birth: 12-Oct-1945

## 2018-03-12 ENCOUNTER — Ambulatory Visit: Payer: Medicare Other | Admitting: Physical Therapy

## 2018-03-12 ENCOUNTER — Ambulatory Visit (INDEPENDENT_AMBULATORY_CARE_PROVIDER_SITE_OTHER): Payer: Medicare Other | Admitting: Nurse Practitioner

## 2018-03-12 ENCOUNTER — Encounter: Payer: Self-pay | Admitting: Nurse Practitioner

## 2018-03-12 ENCOUNTER — Encounter: Payer: Self-pay | Admitting: Physical Therapy

## 2018-03-12 VITALS — BP 112/72 | HR 87 | Temp 98.2°F | Resp 10 | Ht 76.0 in | Wt 158.0 lb

## 2018-03-12 DIAGNOSIS — R269 Unspecified abnormalities of gait and mobility: Secondary | ICD-10-CM | POA: Diagnosis not present

## 2018-03-12 DIAGNOSIS — R634 Abnormal weight loss: Secondary | ICD-10-CM | POA: Diagnosis not present

## 2018-03-12 DIAGNOSIS — N3281 Overactive bladder: Secondary | ICD-10-CM | POA: Diagnosis not present

## 2018-03-12 DIAGNOSIS — N183 Chronic kidney disease, stage 3 unspecified: Secondary | ICD-10-CM

## 2018-03-12 DIAGNOSIS — M6281 Muscle weakness (generalized): Secondary | ICD-10-CM

## 2018-03-12 DIAGNOSIS — R2689 Other abnormalities of gait and mobility: Secondary | ICD-10-CM

## 2018-03-12 DIAGNOSIS — R262 Difficulty in walking, not elsewhere classified: Secondary | ICD-10-CM | POA: Diagnosis not present

## 2018-03-12 LAB — COMPLETE METABOLIC PANEL WITH GFR
AG Ratio: 1.1 (calc) (ref 1.0–2.5)
ALBUMIN MSPROF: 3.3 g/dL — AB (ref 3.6–5.1)
ALKALINE PHOSPHATASE (APISO): 65 U/L (ref 40–115)
ALT: 5 U/L — ABNORMAL LOW (ref 9–46)
AST: 12 U/L (ref 10–35)
BUN/Creatinine Ratio: 17 (calc) (ref 6–22)
BUN: 28 mg/dL — ABNORMAL HIGH (ref 7–25)
CO2: 24 mmol/L (ref 20–32)
Calcium: 8.4 mg/dL — ABNORMAL LOW (ref 8.6–10.3)
Chloride: 111 mmol/L — ABNORMAL HIGH (ref 98–110)
Creat: 1.67 mg/dL — ABNORMAL HIGH (ref 0.70–1.18)
GFR, Est African American: 47 mL/min/{1.73_m2} — ABNORMAL LOW (ref >=60)
GFR, Est Non African American: 40 mL/min/{1.73_m2} — ABNORMAL LOW (ref >=60)
Globulin: 3.1 g/dL (calc) (ref 1.9–3.7)
Glucose, Bld: 73 mg/dL (ref 65–99)
Potassium: 4.6 mmol/L (ref 3.5–5.3)
SODIUM: 140 mmol/L (ref 135–146)
Total Bilirubin: 0.3 mg/dL (ref 0.2–1.2)
Total Protein: 6.4 g/dL (ref 6.1–8.1)

## 2018-03-12 NOTE — Progress Notes (Signed)
Careteam: Patient Care Team: Gayland Curry, DO as PCP - General (Geriatric Medicine) Elmarie Shiley, MD (Nephrology) Milus Banister, MD (Gastroenterology)  Advanced Directive information    Allergies  Allergen Reactions  . Azathioprine     REACTION: affected WBC  . Ciprofloxacin     Pt denies any reaction  . Levaquin [Levofloxacin In D5w]     Pt denies any reaction  . Plendil [Felodipine]     Pt denies any reaction    Chief Complaint  Patient presents with  . Follow-up    1 month follow-up on weight and medication change  . Problem List Update    Patient would like to assure that his problem list is updated to reflect medication induced parkinson's disease   . Immunizations    [facility]   . Best Practice Recommendations    Discuss need for Hep C screening      HPI: Patient is a 73 y.o. male seen in the office today for follow up.  Pt was followed by neurology due to his altered gait. Per neurology He has a nonspecific gait disorder, also mild global weakness, mild fine motor dyscontrol. Overall, his gait disturbance is likely multifactorial in nature secondary to weight loss (he was advised to increase calories and protein he has gained gained 7 lbs since last OV) and muscular deconditioning  (currently getting PT), prior falls, taking multiple medications with side effects (stopped oxybutynin and started myrebetriq which has been very effective)  he is also taking several psychotropic medications and thought that there may be a component of drug-induced parkinsonism given the olanzapine which he has been taking for years. He was advised to follow up with his psychiatrist in regards to this but psychiatrist did not feel like this was effecting this.   He has been suboptimally hydrated and has been drinking more water  Also reports poor vision and plans to follow up with ophthalmology.   Overall he feels like he is doing much better. PT helping  a lot.   Review of Systems:  Review of Systems  Constitutional: Negative for chills, fever and weight loss.  HENT: Negative for congestion and hearing loss.   Eyes: Negative for blurred vision.       Glasses  Respiratory: Negative for cough and shortness of breath.   Cardiovascular: Negative for chest pain, palpitations and leg swelling.  Gastrointestinal: Negative for abdominal pain, blood in stool, constipation, diarrhea and melena.  Genitourinary: Negative for dysuria and frequency.  Musculoskeletal: Negative for falls and joint pain.  Neurological: Positive for tremors and weakness. Negative for dizziness, tingling, loss of consciousness and headaches.       Improved.   Endo/Heme/Allergies: Does not bruise/bleed easily.  Psychiatric/Behavioral: Negative for depression. The patient is not nervous/anxious and does not have insomnia.     Past Medical History:  Diagnosis Date  . Abdominal pain, right lower quadrant   . Abdominal pain, unspecified site   . Acute cholecystitis   . Anemia   . Anemia, unspecified   . Anxiety   . Benign paroxysmal positional vertigo   . Bipolar I disorder, most recent episode (or current) unspecified   . Celiac disease   . Cellulitis and abscess of unspecified site   . Chronic kidney disease, stage III (moderate) (HCC)   . Cough   . Crohn's   . Depression   . Diarrhea   . Dizziness and giddiness   . Dizziness and giddiness   .  Dizziness and giddiness   . Dysuria   . Edema   . Encounter for long-term (current) use of other medications   . Essential and other specified forms of tremor   . Essential hypertension, benign   . Gout 2018  . Hypertensive renal disease, benign   . Hypertrophy of prostate without urinary obstruction and other lower urinary tract symptoms (LUTS)   . Hypopotassemia   . Impacted cerumen   . Impotence of organic origin   . Insomnia with sleep apnea, unspecified   . Insomnia with sleep apnea, unspecified   . Iron  deficiency anemia, unspecified   . Loss of weight   . Malnutrition of mild degree ([place])   . 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 Nausea with vomiting   . Neuralgia, neuritis, and radiculitis, unspecified   . Other abnormal blood chemistry   . Other B-complex deficiencies   . Other B-complex deficiencies   . Other extrapyramidal disease and abnormal movement disorder   . Other extrapyramidal disease and abnormal movement disorder   . Other malaise and fatigue   . Other specified disease of white blood cells   . Other specified disease of white blood cells   . Pain in joint, lower leg   . Pain in limb   . Postinflammatory pulmonary fibrosis ([place])   . Regional enteritis of small intestine ([place])   . Regional enteritis of small intestine with large intestine ([place])   . Renal insufficiency   . Tobacco use disorder   . Unspecified disorder of skin and subcutaneous tissue   . Vertigo 2018  . Viral warts, unspecified   . Viremia, unspecified    Past Surgical History:  Procedure Laterality Date  . CHOLECYSTECTOMY  07-12-2010  . SMALL INTESTINE SURGERY     x 2   Social History:   reports that he quit smoking about 3 years ago. His smoking use included cigarettes. He started smoking about 61 years ago. He has a 49.00 pack-year smoking history. He has never used smokeless tobacco. He reports that he does not drink alcohol or use drugs.  Family History  Problem Relation Age of Onset  . Diabetes Mother        maternal grandmother  . Uterine cancer Mother   . Emphysema Father   . Pneumonia Maternal Grandmother   . Colon cancer Neg Hx     Medications: Patient's Medications  New Prescriptions   No medications on file  Previous Medications   ALLOPURINOL (ZYLOPRIM) 100 MG TABLET    Take 2 tablets (200 mg total) by mouth daily.   AMLODIPINE (NORVASC) 2.5 MG TABLET    Take 2.5 mg by mouth daily.   CALCIUM CARBONATE-VIT D-MIN 600-400 MG-UNIT TABS    Take 1 tablet by mouth 3 (three) times daily.     CHOLECALCIFEROL (VITAMIN D) 1000 UNITS TABLET    Take 1,000 Units by mouth daily.    DIVALPROEX (DEPAKOTE) 500 MG EC TABLET    Take 1,500 mg by mouth at bedtime.    EPOETIN ALFA (PROCRIT) 87867 UNIT/ML INJECTION    Inject 10,000 Units into the skin every 30 (thirty) days.    FERROUS SULFATE 325 (65 FE) MG TABLET    Take 325 mg by mouth 2 (two) times daily.     INFLIXIMAB (REMICADE) 100 MG INJECTION    Infuse Remicade IV schedule 1 48m/kg every 8 weeks Premedicate with Tylenol 500-658mby mouth and Benadryl 25-5010my mouth prior to infusion. Last PPD was on 12/2009.  LOPERAMIDE HCL (LOPERAMIDE A-D PO)    Take 1 capsule by mouth as needed (loose stools).    MAGNESIUM OXIDE (MAG-OX) 400 (241.3 MG) MG TABLET    Take 2,400 mg by mouth 3 (three) times daily.    MECLIZINE (ANTIVERT) 25 MG TABLET    Take 25 mg by mouth 3 (three) times daily as needed for dizziness.    METOPROLOL SUCCINATE (TOPROL-XL) 50 MG 24 HR TABLET    Take 50 mg by mouth daily. Take with or immediately following a meal.   MIRABEGRON ER (MYRBETRIQ) 25 MG TB24 TABLET    Take 1 tablet (25 mg total) by mouth daily.   MULTIPLE VITAMIN (MULTIVITAMIN) TABLET    Take 1 tablet by mouth daily.     NYSTATIN (MYCOSTATIN) 100000 UNIT/ML SUSPENSION    Use as directed 5 mLs (500,000 Units total) in the mouth or throat 4 (four) times daily.   OLANZAPINE (ZYPREXA) 7.5 MG TABLET    Take 1 tablet (7.5 mg total) by mouth at bedtime.   OMEPRAZOLE (PRILOSEC) 20 MG CAPSULE    Take 20 mg by mouth daily before supper.     PRAZOSIN (MINIPRESS) 5 MG CAPSULE    Take 5 mg by mouth at bedtime.   VITAMIN E 400 UNIT CAPSULE    Take 400 Units by mouth daily.    Modified Medications   No medications on file  Discontinued Medications   No medications on file     Physical Exam:  Vitals:   03/12/18 1525  BP: 112/72  Pulse: 87  Resp: 10  Temp: 98.2 F (36.8 C)  TempSrc: Oral  SpO2: 98%  Weight: 158 lb (71.7 kg)  Height: 6' 4"  (1.93 m)   Body mass  index is 19.23 kg/m.  Physical Exam Constitutional:      General: He is not in acute distress.    Comments: All thin male  Cardiovascular:     Rate and Rhythm: Normal rate and regular rhythm.     Heart sounds: Normal heart sounds.  Pulmonary:     Effort: Pulmonary effort is normal. No respiratory distress.     Breath sounds: Normal breath sounds. No stridor. No wheezing or rales.  Abdominal:     General: Bowel sounds are normal. There is no distension.     Palpations: Abdomen is soft.     Tenderness: There is no abdominal tenderness.  Musculoskeletal: Normal range of motion.  Skin:    General: Skin is warm and dry.     Capillary Refill: Capillary refill takes less than 2 seconds.  Neurological:     General: No focal deficit present.     Mental Status: He is alert and oriented to person, place, and time.     Motor: Weakness present.     Coordination: Coordination normal.     Gait: Gait abnormal (shuffling).     Deep Tendon Reflexes: Reflexes normal.     Labs reviewed: Basic Metabolic Panel: Recent Labs    05/31/17 1203  06/18/17 1214 02/07/18 1255 02/07/18 1313 02/10/18 1618  NA 140   < > 141 138 139  --   K 4.1   < > 3.5 5.0 5.0  --   CL 108  --  111 104 103  --   CO2 24  --  23 25  --   --   GLUCOSE 83  --  103* 98 91  --   BUN 18   < > 14 29* 28*  --  CREATININE 1.77*   < > 1.69* 2.14* 2.20*  --   CALCIUM 9.0  --  8.5* 9.2  --   --   TSH  --   --   --   --   --  2.66   < > = values in this interval not displayed.   Liver Function Tests: Recent Labs    05/31/17 1203 06/11/17 06/18/17 1214 02/07/18 1255  AST 13* 9* 14* 28  ALT 7* 6* 8* 22  ALKPHOS 53 55 46 63  BILITOT 0.5  --  0.5 0.4  PROT 7.1  --  6.9 6.9  ALBUMIN 3.2*  --  3.0* 3.2*   Recent Labs    06/18/17 1214  LIPASE 31   No results for input(s): AMMONIA in the last 8760 hours. CBC: Recent Labs    05/04/17 0903  05/31/17 1203 06/11/17 06/18/17 1214  02/07/18 1255 02/07/18 1313  02/26/18 1115  WBC 8.6  --  9.5 8.3 8.8  --  6.2  --   --   NEUTROABS 4,773  --  6.2  --   --   --  4.4  --   --   HGB 11.3*   < > 11.7* 10.6* 10.0*   < > 11.3* 12.2* 11.0*  HCT 34.3*  --  37.6* 33* 31.7*  --  36.2* 36.0*  --   MCV 98.6  --  103.6*  --  102.6*  --  108.7*  --   --   PLT 180  --  190 171 187  --  145*  --   --    < > = values in this interval not displayed.   Lipid Panel: Recent Labs    05/04/17 0903  CHOL 138  HDL 42  LDLCALC 71  TRIG 176*  CHOLHDL 3.3   TSH: Recent Labs    02/10/18 1618  TSH 2.66   A1C: Lab Results  Component Value Date   HGBA1C 4.9 08/10/2015     Assessment/Plan 1. Gait abnormality Has improved, continues with physical therapy which has improved gait.   2. Weight loss, unintentional -weight gain noted. Continues to increase calories and protein intake.   3. Overactive bladder Doing well on myrbetriq. Will continue current regimen  4. Chronic kidney disease (CKD), stage III (moderate) (HCC) Has increased hydration. Will follow up lab todau - COMPLETE METABOLIC PANEL WITH GFR  Next appt: as scheduled with Dr Mariea Clonts in April.  Carlos American. [place], [facility] [phone]

## 2018-03-12 NOTE — Therapy (Signed)
[facility] [address] [place], [place], [address] Phone: [phone]   Fax:  [phone]  Physical Therapy Treatment  Patient Details  Name: Brent [name] MRN: [id number] Date of Birth: 1945/07/15 Referring Provider (PT): Sherrie Mustache, NP   Encounter Date: 03/12/2018  PT End of Session - 03/12/18 2242    Visit Number  5    Number of Visits  12    Date for PT Re-Evaluation  04/01/18    Authorization Type  [employer] MCR - 10th visit PN    PT Start Time  1316    PT Stop Time  1401    PT Time Calculation (min)  45 min    Activity Tolerance  Patient tolerated treatment well    Behavior During Therapy  [facility] for tasks assessed/performed       Past Medical History:  Diagnosis Date  . Abdominal pain, right lower quadrant   . Abdominal pain, unspecified site   . Acute cholecystitis   . Anemia   . Anemia, unspecified   . Anxiety   . Benign paroxysmal positional vertigo   . Bipolar I disorder, most recent episode (or current) unspecified   . Celiac disease   . Cellulitis and abscess of unspecified site   . Chronic kidney disease, stage III (moderate) (HCC)   . Cough   . Crohn's   . Depression   . Diarrhea   . Dizziness and giddiness   . Dizziness and giddiness   . Dizziness and giddiness   . Dysuria   . Edema   . Encounter for long-term (current) use of other medications   . Essential and other specified forms of tremor   . Essential hypertension, benign   . Gout 2018  . Hypertensive renal disease, benign   . Hypertrophy of prostate without urinary obstruction and other lower urinary tract symptoms (LUTS)   . Hypopotassemia   . Impacted cerumen   . Impotence of organic origin   . Insomnia with sleep apnea, unspecified   . Insomnia with sleep apnea, unspecified   . Iron deficiency anemia, unspecified   . Loss of weight   . Malnutrition of mild degree ([place])   . 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 Nausea with vomiting    . Neuralgia, neuritis, and radiculitis, unspecified   . Other abnormal blood chemistry   . Other B-complex deficiencies   . Other B-complex deficiencies   . Other extrapyramidal disease and abnormal movement disorder   . Other extrapyramidal disease and abnormal movement disorder   . Other malaise and fatigue   . Other specified disease of white blood cells   . Other specified disease of white blood cells   . Pain in joint, lower leg   . Pain in limb   . Postinflammatory pulmonary fibrosis ([place])   . Regional enteritis of small intestine ([place])   . Regional enteritis of small intestine with large intestine ([place])   . Renal insufficiency   . Tobacco use disorder   . Unspecified disorder of skin and subcutaneous tissue   . Vertigo 2018  . Viral warts, unspecified   . Viremia, unspecified     Past Surgical History:  Procedure Laterality Date  . CHOLECYSTECTOMY  07-12-2010  . SMALL INTESTINE SURGERY     x 2    There were no vitals filed for this visit.  Subjective Assessment - 03/12/18 1321    Subjective  Has been practicing SLS and step ups at home.  Sit >  stand is much easier now.    Pertinent History  PMH:anx/dep,BPPV,bipolar,celiac/chohn's,CKD III,dizziness,chronic leg pain    Limitations  Lifting;Standing;Walking    How long can you stand comfortably?  10 min    How long can you walk comfortably?  usuall grocery store but did have an episode last week where he could not make in through store and had to use cart    Currently in Pain?  No/denies                       [facility] Adult PT Treatment/Exercise - 03/12/18 1355      Knee/Hip Exercises: Standing   Forward Step Up  Right;Left;1 set;10 reps;Hand Hold: 1;Step Height: 4"      Knee/Hip Exercises: Prone   Hip Extension  Strengthening;Right;Left;1 set;10 reps    Other Prone Exercises  60 seconds each quad and hip flexor stretches on R and L to improve hip extension ROM        PWR [facility]) - 03/12/18 1324     PWR! exercises  Moves in sitting;Moves in standing    PWR! Up  x 10    PWR! Rock  x 10    PWR! Twist  x 10    PWR Step  x 10    PWR! Up  x 10    PWR! Rock  x 10    PWR! Twist  x 10    PWR! Step  x 10               PT Long Term Goals - 02/26/18 1456      PT LONG TERM GOAL #1   Title  Pt will be I and compliant with HEP. 6 weeks 04/01/18    Status  New      PT LONG TERM GOAL #2   Title  Pt will improve 5TSTS to less than 18 sec to show improved balance and endurance.  6 weeks 04/01/18    Status  New      PT LONG TERM GOAL #3   Title  Pt will improve gait speed in 10MWT <10 sec. 6 weeks 04/01/18    Status  New      PT LONG TERM GOAL #4   Title  Pt will improve BERG to at least 46 to show improved balance and decreased risk for falls. 6 weeks 04/01/18    Status  New      PT LONG TERM GOAL #5   Title  Pt will be able to ambulate 750 feet on varrying surfaces with LRAD and negotiate curb and vary speeds with functional gait pattern. 6 weeks 04/01/18    Status  New      Additional Long Term Goals   Additional Long Term Goals  Yes      PT LONG TERM GOAL #6   Title  Pt will improve FGA score by 3 points to indicate decreased risk for falls    Baseline  24/30    Status  New            Plan - 03/12/18 2242    Clinical Impression Statement  Initiated session with seated and standing PWR exercises to continue to facilitate upright posture, rotation and weight shift needed to improve gait sequence and safety of ambulation.  During step ups, pt continued to demonstrate difficulty performing terminal hip and knee extension bilaterally.  Transitioned to prone on mat in order to stretch bilat hip flexor muscles and perform strengthening  of bilat hip extensors.  Pt tolerated well; will continue to progress towards LTG.    Rehab Potential  Good    PT Frequency  2x / week    PT Duration  6 weeks    PT Treatment/Interventions  Moist Heat;Therapeutic exercise;Balance  training;Neuromuscular re-education;Manual techniques;Passive range of motion    PT Next Visit Plan  Prone hip flexor stretches, prone hip extension, standing terminal hip extension.  Continue to work on LE strength, stair negotiation, balance with narrow BOS and on compliant surfaces;  Try some of the PWR moves for weight shifting and stepping.      PT Home Exercise Plan  [id number]        Patient will benefit from skilled therapeutic intervention in order to improve the following deficits and impairments:  Abnormal gait, Decreased activity tolerance, Decreased balance, Decreased endurance, Decreased safety awareness, Decreased range of motion, Difficulty walking, Decreased strength, Postural dysfunction  Visit Diagnosis: Other abnormalities of gait and mobility  Difficulty in walking, not elsewhere classified  Muscle weakness (generalized)     Problem List Patient Active Problem List   Diagnosis Date Noted  . Candida esophagitis ([place]) 09/22/2017  . History of smoking 30 or more pack years 01/05/2017  . Bipolar 1 disorder ([place])   . BPH (benign prostatic hyperplasia) 03/31/2013  . 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 Essential hypertension, benign 03/31/2013  . Chronic kidney disease (CKD), stage III (moderate) ([place]) 06/04/2012  . Crohn's regional enteritis [facility]) 01/23/2010    Rico Junker, PT, DPT 03/12/18    10:49 PM    [facility] [address] [place], [place], [address] Phone: [phone]   Fax:  [phone]  Name: Brent [name] MRN: [id number] Date of Birth: August 28, 1945

## 2018-03-17 ENCOUNTER — Encounter: Payer: Self-pay | Admitting: Physical Therapy

## 2018-03-17 ENCOUNTER — Ambulatory Visit: Payer: Medicare Other | Admitting: Physical Therapy

## 2018-03-17 DIAGNOSIS — R262 Difficulty in walking, not elsewhere classified: Secondary | ICD-10-CM

## 2018-03-17 DIAGNOSIS — M6281 Muscle weakness (generalized): Secondary | ICD-10-CM

## 2018-03-17 DIAGNOSIS — R2689 Other abnormalities of gait and mobility: Secondary | ICD-10-CM

## 2018-03-17 NOTE — Therapy (Signed)
[facility] [address] [place], [place], [address] Phone: [phone]   Fax:  [phone]  Physical Therapy Treatment  Patient Details  Name: Brent [name] MRN: [id number] Date of Birth: 10/29/1945 Referring Provider (PT): Sherrie Mustache, NP   Encounter Date: 03/17/2018  PT End of Session - 03/17/18 1659    Visit Number  6    Number of Visits  12    Date for PT Re-Evaluation  04/01/18    Authorization Type  [employer] MCR - 10th visit PN    PT Start Time  1405    PT Stop Time  1444    PT Time Calculation (min)  39 min    Activity Tolerance  Patient tolerated treatment well    Behavior During Therapy  [facility] for tasks assessed/performed       Past Medical History:  Diagnosis Date  . Abdominal pain, right lower quadrant   . Abdominal pain, unspecified site   . Acute cholecystitis   . Anemia   . Anemia, unspecified   . Anxiety   . Benign paroxysmal positional vertigo   . Bipolar I disorder, most recent episode (or current) unspecified   . Celiac disease   . Cellulitis and abscess of unspecified site   . Chronic kidney disease, stage III (moderate) (HCC)   . Cough   . Crohn's   . Depression   . Diarrhea   . Dizziness and giddiness   . Dizziness and giddiness   . Dizziness and giddiness   . Dysuria   . Edema   . Encounter for long-term (current) use of other medications   . Essential and other specified forms of tremor   . Essential hypertension, benign   . Gout 2018  . Hypertensive renal disease, benign   . Hypertrophy of prostate without urinary obstruction and other lower urinary tract symptoms (LUTS)   . Hypopotassemia   . Impacted cerumen   . Impotence of organic origin   . Insomnia with sleep apnea, unspecified   . Insomnia with sleep apnea, unspecified   . Iron deficiency anemia, unspecified   . Loss of weight   . Malnutrition of mild degree ([place])   . 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 Nausea with vomiting    . Neuralgia, neuritis, and radiculitis, unspecified   . Other abnormal blood chemistry   . Other B-complex deficiencies   . Other B-complex deficiencies   . Other extrapyramidal disease and abnormal movement disorder   . Other extrapyramidal disease and abnormal movement disorder   . Other malaise and fatigue   . Other specified disease of white blood cells   . Other specified disease of white blood cells   . Pain in joint, lower leg   . Pain in limb   . Postinflammatory pulmonary fibrosis ([place])   . Regional enteritis of small intestine ([place])   . Regional enteritis of small intestine with large intestine ([place])   . Renal insufficiency   . Tobacco use disorder   . Unspecified disorder of skin and subcutaneous tissue   . Vertigo 2018  . Viral warts, unspecified   . Viremia, unspecified     Past Surgical History:  Procedure Laterality Date  . CHOLECYSTECTOMY  07-12-2010  . SMALL INTESTINE SURGERY     x 2    There were no vitals filed for this visit.  Subjective Assessment - 03/17/18 1408    Subjective  Had follow up with physician.  Pt appears to be standing  straighter today.  Has been gaining weight.    Pertinent History  PMH: anx/dep,BPPV,bipolar,celiac/crohn's,CKD III,dizziness,chronic leg pain    Limitations  Lifting;Standing;Walking    How long can you stand comfortably?  10 min    How long can you walk comfortably?  usuall grocery store but did have an episode last week where he could not make in through store and had to use cart    Currently in Pain?  No/denies                       [facility] Adult PT Treatment/Exercise - 03/17/18 1411      Exercises   Exercises  Shoulder;Knee/Hip      Knee/Hip Exercises: Stretches   Active Hamstring Stretch  Right;Left;2 reps;30 seconds    Active Hamstring Stretch Limitations  with belt around foot, seated edge of mat    Hip Flexor Stretch  Right;Left;2 reps;30 seconds      Knee/Hip Exercises: Standing   Terminal  Knee Extension  Strengthening;Left;Right;1 set;10 reps;Theraband    Theraband Level (Terminal Knee Extension)  Level 3 (Green)      Knee/Hip Exercises: Prone   Hamstring Curl  1 set;5 reps;2 seconds    Hamstring Curl Limitations  curl with hip extension    Hip Extension  Strengthening;Right;Left;1 set;5 sets    Hip Extension Limitations  therapist maintaining neutral pelvis      Shoulder Exercises: Prone   Horizontal ABduction 1  Right;Left;Other (comment)    Horizontal ABduction 1 Limitations  prone with UE supported in T position; focus on scapular retraction unilaterally x 8 reps, bilaterally x 3 reps             PT Education - 03/17/18 1658    Education Details  focus of final three visits    Person(s) Educated  Patient    Methods  Explanation    Comprehension  Verbalized understanding          PT Long Term Goals - 02/26/18 1456      PT LONG TERM GOAL #1   Title  Pt will be I and compliant with HEP. 6 weeks 04/01/18    Status  New      PT LONG TERM GOAL #2   Title  Pt will improve 5TSTS to less than 18 sec to show improved balance and endurance.  6 weeks 04/01/18    Status  New      PT LONG TERM GOAL #3   Title  Pt will improve gait speed in 10MWT <10 sec. 6 weeks 04/01/18    Status  New      PT LONG TERM GOAL #4   Title  Pt will improve BERG to at least 46 to show improved balance and decreased risk for falls. 6 weeks 04/01/18    Status  New      PT LONG TERM GOAL #5   Title  Pt will be able to ambulate 750 feet on varrying surfaces with LRAD and negotiate curb and vary speeds with functional gait pattern. 6 weeks 04/01/18    Status  New      Additional Long Term Goals   Additional Long Term Goals  Yes      PT LONG TERM GOAL #6   Title  Pt will improve FGA score by 3 points to indicate decreased risk for falls    Baseline  24/30    Status  New  Plan - 03/17/18 1659    Clinical Impression Statement  Continued strengthening of posterior hip and  postural muscles as well as stretching of anterior hip and chest muscles in prone.  Also performed standing terminal knee extension training and having pt maintain with terminal hip extension in standing.  Pt had greater difficulty performing with LLE.  During standing knee extension pt reported tightness in posterior knee.  Returned to sitting and finished with hamstring stretch bilaterally.  Will continue to progress and begin to establish a final HEP for pt to continue at D/C.    Rehab Potential  Good    PT Frequency  2x / week    PT Duration  6 weeks    PT Treatment/Interventions  Moist Heat;Therapeutic exercise;Balance training;Neuromuscular re-education;Manual techniques;Passive range of motion    PT Next Visit Plan  Prone hip flexor stretches, prone hip extension, standing terminal hip extension.  Continue to work on LE strength, stair negotiation, balance with narrow BOS and on compliant surfaces;  Try some of the PWR moves for weight shifting and stepping.      PT Home Exercise Plan  [id number]        Patient will benefit from skilled therapeutic intervention in order to improve the following deficits and impairments:  Abnormal gait, Decreased activity tolerance, Decreased balance, Decreased endurance, Decreased safety awareness, Decreased range of motion, Difficulty walking, Decreased strength, Postural dysfunction  Visit Diagnosis: Other abnormalities of gait and mobility  Difficulty in walking, not elsewhere classified  Muscle weakness (generalized)     Problem List Patient Active Problem List   Diagnosis Date Noted  . Candida esophagitis ([place]) 09/22/2017  . History of smoking 30 or more pack years 01/05/2017  . Bipolar 1 disorder ([place])   . BPH (benign prostatic hyperplasia) 03/31/2013  . 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 Essential hypertension, benign 03/31/2013  . Chronic kidney disease (CKD), stage III (moderate) ([place]) 06/04/2012  . Crohn's regional enteritis  [facility]) 01/23/2010    Rico Junker, PT, DPT 03/17/18    5:03 PM    [facility] [address] [place], [place], [address] Phone: [phone]   Fax:  [phone]  Name: ARLON BLEIER MRN: [id number] Date of Birth: April 30, 1945

## 2018-03-19 ENCOUNTER — Ambulatory Visit: Payer: Medicare Other | Admitting: Physical Therapy

## 2018-03-19 ENCOUNTER — Encounter: Payer: Self-pay | Admitting: Physical Therapy

## 2018-03-19 DIAGNOSIS — R2689 Other abnormalities of gait and mobility: Secondary | ICD-10-CM | POA: Diagnosis not present

## 2018-03-19 DIAGNOSIS — M6281 Muscle weakness (generalized): Secondary | ICD-10-CM

## 2018-03-19 DIAGNOSIS — R262 Difficulty in walking, not elsewhere classified: Secondary | ICD-10-CM

## 2018-03-21 NOTE — Therapy (Signed)
[facility] [address] [place], [place], [address] Phone: [phone]   Fax:  [phone]  Physical Therapy Treatment  Patient Details  Name: Brent [name] MRN: [id number] Date of Birth: 1945/04/15 Referring Provider (PT): Sherrie Mustache, NP   Encounter Date: 03/19/2018   03/19/18 1322  PT Visits / Re-Eval  Visit Number 7  Number of Visits 12  Date for PT Re-Evaluation 04/01/18  Authorization  Authorization Type U[employer] MCR - 10th visit PN  PT Time Calculation  PT Start Time 1319  PT Stop Time 1358  PT Time Calculation (min) 39 min  PT - End of Session  Activity Tolerance Patient tolerated treatment well  Behavior During Therapy [facility] for tasks assessed/performed     Past Medical History:  Diagnosis Date  . Abdominal pain, right lower quadrant   . Abdominal pain, unspecified site   . Acute cholecystitis   . Anemia   . Anemia, unspecified   . Anxiety   . Benign paroxysmal positional vertigo   . Bipolar I disorder, most recent episode (or current) unspecified   . Celiac disease   . Cellulitis and abscess of unspecified site   . Chronic kidney disease, stage III (moderate) (HCC)   . Cough   . Crohn's   . Depression   . Diarrhea   . Dizziness and giddiness   . Dizziness and giddiness   . Dizziness and giddiness   . Dysuria   . Edema   . Encounter for long-term (current) use of other medications   . Essential and other specified forms of tremor   . Essential hypertension, benign   . Gout 2018  . Hypertensive renal disease, benign   . Hypertrophy of prostate without urinary obstruction and other lower urinary tract symptoms (LUTS)   . Hypopotassemia   . Impacted cerumen   . Impotence of organic origin   . Insomnia with sleep apnea, unspecified   . Insomnia with sleep apnea, unspecified   . Iron deficiency anemia, unspecified   . Loss of weight   . Malnutrition of mild degree ([place])   . 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 Nausea with vomiting   . Neuralgia, neuritis, and radiculitis, unspecified   . Other abnormal blood chemistry   . Other B-complex deficiencies   . Other B-complex deficiencies   . Other extrapyramidal disease and abnormal movement disorder   . Other extrapyramidal disease and abnormal movement disorder   . Other malaise and fatigue   . Other specified disease of white blood cells   . Other specified disease of white blood cells   . Pain in joint, lower leg   . Pain in limb   . Postinflammatory pulmonary fibrosis ([place])   . Regional enteritis of small intestine ([place])   . Regional enteritis of small intestine with large intestine ([place])   . Renal insufficiency   . Tobacco use disorder   . Unspecified disorder of skin and subcutaneous tissue   . Vertigo 2018  . Viral warts, unspecified   . Viremia, unspecified     Past Surgical History:  Procedure Laterality Date  . CHOLECYSTECTOMY  07-12-2010  . SMALL INTESTINE SURGERY     x 2    There were no vitals filed for this visit.     03/19/18 1322  Symptoms/Limitations  Subjective No new complaitns. No falls or pain to report.  Pertinent History PMH: anx/dep,BPPV,bipolar,celiac/crohn's,CKD III,dizziness,chronic leg pain  Limitations Lifting;Standing;Walking  How long can you stand comfortably? 10  min  How long can you walk comfortably? usuall grocery store but did have an episode last week where he could not make in through store and had to use cart  Pain Assessment  Currently in Pain? No/denies        03/19/18 0001  Exercises  Exercises Other Exercises  Other Exercises  hooklying on mat table with towel roll to spine: arms at sides for T x 1 mintues, then in V placement for 1 minute; standing with back against door frame: bil UE reach to top of door and sliding hands back along it till stretch felt x 10 reps, then with green band pt performed shoulder horizontal abductions and alternating diagonals x 10 reps each;  standing in wide stance with chair in front for safety: PWR! moves in standing for PWR! up and PWR! twist for 10 reps each. cues needed on ex form/technique.  Knee/Hip Exercises: Stretches  Hip Flexor Stretch Both;1 rep;60 seconds;Limitations  Hip Flexor Stretch Limitations lying supine with leg off edge of mat, knee bent by PTA till stretch felt.   Knee/Hip Exercises: Supine  Bridges AROM;Strengthening;Both;1 set;10 reps;Limitations  Knee/Hip Exercises: Prone  Hamstring Curl 1 set;10 reps;Limitations  Hamstring Curl Limitations with yellow band resistance, manual assist to maintain correct LE position  Hip Extension AROM;Strengthening;Both;1 set;10 reps;Limitations  Hip Extension Limitations manual assist to keep hips neutral, cues for slow, controlled movements.        PT Long Term Goals - 02/26/18 1456      PT LONG TERM GOAL #1   Title  Pt will be I and compliant with HEP. 6 weeks 04/01/18    Status  New      PT LONG TERM GOAL #2   Title  Pt will improve 5TSTS to less than 18 sec to show improved balance and endurance.  6 weeks 04/01/18    Status  New      PT LONG TERM GOAL #3   Title  Pt will improve gait speed in 10MWT <10 sec. 6 weeks 04/01/18    Status  New      PT LONG TERM GOAL #4   Title  Pt will improve BERG to at least 46 to show improved balance and decreased risk for falls. 6 weeks 04/01/18    Status  New      PT LONG TERM GOAL #5   Title  Pt will be able to ambulate 750 feet on varrying surfaces with LRAD and negotiate curb and vary speeds with functional gait pattern. 6 weeks 04/01/18    Status  New      Additional Long Term Goals   Additional Long Term Goals  Yes      PT LONG TERM GOAL #6   Title  Pt will improve FGA score by 3 points to indicate decreased risk for falls    Baseline  24/30    Status  New          03/19/18 1322  Plan  Clinical Impression Statement Today's skilled session focused on postural strengthening without any issues reported. The pt is  progressing toward goals and should benefit from continued PT to progress toward unmet goals.   Pt will benefit from skilled therapeutic intervention in order to improve on the following deficits Abnormal gait;Decreased activity tolerance;Decreased balance;Decreased endurance;Decreased safety awareness;Decreased range of motion;Difficulty walking;Decreased strength;Postural dysfunction  Rehab Potential Good  PT Frequency 2x / week  PT Duration 6 weeks  PT Treatment/Interventions Moist Heat;Therapeutic exercise;Balance training;Neuromuscular re-education;Manual techniques;Passive  range of motion  PT Next Visit Plan Prone hip flexor stretches, prone hip extension, standing terminal hip extension.  Continue to work on LE strength, stair negotiation, balance with narrow BOS and on compliant surfaces;  Try some of the PWR moves for weight shifting and stepping.    PT Home Exercise Plan [id number]           Patient will benefit from skilled therapeutic intervention in order to improve the following deficits and impairments:  Abnormal gait, Decreased activity tolerance, Decreased balance, Decreased endurance, Decreased safety awareness, Decreased range of motion, Difficulty walking, Decreased strength, Postural dysfunction  Visit Diagnosis: Other abnormalities of gait and mobility  Difficulty in walking, not elsewhere classified  Muscle weakness (generalized)     Problem List Patient Active Problem List   Diagnosis Date Noted  . Candida esophagitis ([place]) 09/22/2017  . History of smoking 30 or more pack years 01/05/2017  . Bipolar 1 disorder ([place])   . BPH (benign prostatic hyperplasia) 03/31/2013  . 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 Essential hypertension, benign 03/31/2013  . Chronic kidney disease (CKD), stage III (moderate) ([place]) 06/04/2012  . Crohn's regional enteritis [facility]) 01/23/2010    Willow Ora, PTA, [facility] [address] [place], [place] [address] [phone] 03/21/18, 10:27 PM   Name: Brent [name] MRN: [id number] Date of Birth: December 27, 1945

## 2018-03-24 ENCOUNTER — Encounter: Payer: Self-pay | Admitting: Physical Therapy

## 2018-03-24 ENCOUNTER — Ambulatory Visit: Payer: Medicare Other | Admitting: Physical Therapy

## 2018-03-24 DIAGNOSIS — M6281 Muscle weakness (generalized): Secondary | ICD-10-CM

## 2018-03-24 DIAGNOSIS — R262 Difficulty in walking, not elsewhere classified: Secondary | ICD-10-CM | POA: Diagnosis not present

## 2018-03-24 DIAGNOSIS — R2689 Other abnormalities of gait and mobility: Secondary | ICD-10-CM | POA: Diagnosis not present

## 2018-03-24 NOTE — Therapy (Signed)
[facility] [address] [place], [place], [address] Phone: [phone]   Fax:  [phone]  Physical Therapy Treatment  Patient Details  Name: Brent [name] MRN: [id number] Date of Birth: [birth year] Referring Provider (PT): Sherrie Mustache, NP   Encounter Date: 03/24/2018  PT End of Session - 03/24/18 1522    Visit Number  8    Number of Visits  12    Date for PT Re-Evaluation  04/01/18    Authorization Type  [employer] MCR - 10th visit PN    PT Start Time  1315    PT Stop Time  1359    PT Time Calculation (min)  44 min    Activity Tolerance  Patient tolerated treatment well    Behavior During Therapy  [facility] for tasks assessed/performed       Past Medical History:  Diagnosis Date  . Abdominal pain, right lower quadrant   . Abdominal pain, unspecified site   . Acute cholecystitis   . Anemia   . Anemia, unspecified   . Anxiety   . Benign paroxysmal positional vertigo   . Bipolar I disorder, most recent episode (or current) unspecified   . Celiac disease   . Cellulitis and abscess of unspecified site   . Chronic kidney disease, stage III (moderate) (HCC)   . Cough   . Crohn's   . Depression   . Diarrhea   . Dizziness and giddiness   . Dizziness and giddiness   . Dizziness and giddiness   . Dysuria   . Edema   . Encounter for long-term (current) use of other medications   . Essential and other specified forms of tremor   . Essential hypertension, benign   . Gout 2018  . Hypertensive renal disease, benign   . Hypertrophy of prostate without urinary obstruction and other lower urinary tract symptoms (LUTS)   . Hypopotassemia   . Impacted cerumen   . Impotence of organic origin   . Insomnia with sleep apnea, unspecified   . Insomnia with sleep apnea, unspecified   . Iron deficiency anemia, unspecified   . Loss of weight   . Malnutrition of mild degree ([place])   . 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 Nausea with vomiting    . Neuralgia, neuritis, and radiculitis, unspecified   . Other abnormal blood chemistry   . Other B-complex deficiencies   . Other B-complex deficiencies   . Other extrapyramidal disease and abnormal movement disorder   . Other extrapyramidal disease and abnormal movement disorder   . Other malaise and fatigue   . Other specified disease of white blood cells   . Other specified disease of white blood cells   . Pain in joint, lower leg   . Pain in limb   . Postinflammatory pulmonary fibrosis ([place])   . Regional enteritis of small intestine ([place])   . Regional enteritis of small intestine with large intestine ([place])   . Renal insufficiency   . Tobacco use disorder   . Unspecified disorder of skin and subcutaneous tissue   . Vertigo 2018  . Viral warts, unspecified   . Viremia, unspecified     Past Surgical History:  Procedure Laterality Date  . CHOLECYSTECTOMY  07-12-2010  . SMALL INTESTINE SURGERY     x 2    There were no vitals filed for this visit.  Subjective Assessment - 03/24/18 1321    Subjective  Doing well; is remembering to take his time when he gets  out of bed and when he is making turns.    Pertinent History  PMH: anx/dep,BPPV,bipolar,celiac/crohn's,CKD III,dizziness,chronic leg pain    Limitations  Lifting;Standing;Walking    How long can you stand comfortably?  10 min    How long can you walk comfortably?  usuall grocery store but did have an episode last week where he could not make in through store and had to use cart    Currently in Pain?  No/denies         [facility] PT Assessment - 03/24/18 1324      Ambulation/Gait   Ambulation/Gait  Yes    Ambulation/Gait Assistance  6: Modified independent (Device/Increase time)    Ambulation Distance (Feet)  750 Feet    Assistive device  None    Gait Pattern  Poor foot clearance - right;Poor foot clearance - left;Trunk flexed;Right flexed knee in stance;Left flexed knee in stance    Ambulation Surface   Unlevel;Outdoor;Paved    Stairs  Yes    Stairs Assistance  6: Modified independent (Device/Increase time)    Stair Management Technique  No rails;Alternating pattern;Forwards    Number of Stairs  4    Height of Stairs  6    Ramp  6: Modified independent (Device)    Curb  6: Modified independent (Device/increase time)      Standardized Balance Assessment   Standardized Balance Assessment  Berg Balance Test;Five Times Sit to Stand;10 meter walk test    Five times sit to stand comments   16 seconds from low mat, no use of UE    10 Meter Walk  8.16 seconds or 4.01 ft/sec      Berg Balance Test   Sit to Stand  Able to stand without using hands and stabilize independently    Standing Unsupported  Able to stand safely 2 minutes    Sitting with Back Unsupported but Feet Supported on Floor or Stool  Able to sit safely and securely 2 minutes    Stand to Sit  Sits safely with minimal use of hands    Transfers  Able to transfer safely, minor use of hands    Standing Unsupported with Eyes Closed  Able to stand 10 seconds safely    Standing Ubsupported with Feet Together  Able to place feet together independently and stand 1 minute safely    From Standing, Reach Forward with Outstretched Arm  Can reach confidently >25 cm (10")    From Standing Position, Pick up Object from Floor  Able to pick up shoe safely and easily    From Standing Position, Turn to Look Behind Over each Shoulder  Looks behind from both sides and weight shifts well    Turn 360 Degrees  Able to turn 360 degrees safely one side only in 4 seconds or less    Standing Unsupported, Alternately Place Feet on Step/Stool  Able to complete 4 steps without aid or supervision    Standing Unsupported, One Foot in Front  Able to plae foot ahead of the other independently and hold 30 seconds    Standing on One Leg  Able to lift leg independently and hold 5-10 seconds    Total Score  51    Berg comment:  51/56      Functional Gait  Assessment    Gait assessed   Yes    Gait Level Surface  Walks 20 ft in less than 5.5 sec, no assistive devices, good speed, no evidence for imbalance, normal gait pattern,  deviates no more than 6 in outside of the 12 in walkway width.    Change in Gait Speed  Able to smoothly change walking speed without loss of balance or gait deviation. Deviate no more than 6 in outside of the 12 in walkway width.    Gait with Horizontal Head Turns  Performs head turns smoothly with slight change in gait velocity (eg, minor disruption to smooth gait path), deviates 6-10 in outside 12 in walkway width, or uses an assistive device.    Gait with Vertical Head Turns  Performs head turns with no change in gait. Deviates no more than 6 in outside 12 in walkway width.    Gait and Pivot Turn  Pivot turns safely within 3 sec and stops quickly with no loss of balance.    Step Over Obstacle  Is able to step over 2 stacked shoe boxes taped together (9 in total height) without changing gait speed. No evidence of imbalance.    Gait with Narrow Base of Support  Ambulates 4-7 steps.    Gait with Eyes Closed  Cannot walk 20 ft without assistance, severe gait deviations or imbalance, deviates greater than 15 in outside 12 in walkway width or will not attempt task.    Ambulating Backwards  Walks 20 ft, slow speed, abnormal gait pattern, evidence for imbalance, deviates 10-15 in outside 12 in walkway width.    Steps  Alternating feet, no rail.    Total Score  22    FGA comment:  22/30                           PT Education - 03/24/18 1521    Education Details  progress towards goals and areas to address with final HEP    Person(s) Educated  Patient    Methods  Explanation    Comprehension  Verbalized understanding          PT Long Term Goals - 03/24/18 1323      PT LONG TERM GOAL #1   Title  Pt will be I and compliant with HEP. 6 weeks 04/01/18    Status  On-going      PT LONG TERM GOAL #2   Title  Pt will  improve 5TSTS to less than 18 sec to show improved balance and endurance.  6 weeks 04/01/18    Baseline  16 seconds, no UE     Status  Achieved      PT LONG TERM GOAL #3   Title  Pt will improve gait speed in 10MWT <10 sec. 6 weeks 04/01/18    Baseline  8 seconds    Status  Achieved      PT LONG TERM GOAL #4   Title  Pt will improve BERG to at least 46 to show improved balance and decreased risk for falls. 6 weeks 04/01/18    Baseline  51/56    Status  Achieved      PT LONG TERM GOAL #5   Title  Pt will be able to ambulate 750 feet on varrying surfaces with LRAD and negotiate curb and vary speeds with functional gait pattern.      6 weeks 04/01/18    Status  Achieved      PT LONG TERM GOAL #6   Title  Pt will improve FGA score by 3 points to indicate decreased risk for falls    Baseline  24/30 > 22/30  Status  Not Met            Plan - 03/24/18 1357    Clinical Impression Statement  Treatment session initiated assessment of progress towards LTG.  Pt is making good progress and has met 4/6 LTG; one goal - HEP to be assessed at next and final visit.  Pt did not meet FGA goal due to ongoing imbalance and difficulty with narrow BOS, eyes closed and backwards ambulation.  Pt is demonstrating improvements in standing balance, gait speed and LE strength and demonstrates decreased falls risk.  Will finalize HEP next session and plan to D/C next session.  Pt agreeable.    Rehab Potential  Good    PT Frequency  2x / week    PT Duration  6 weeks    PT Treatment/Interventions  Moist Heat;Therapeutic exercise;Balance training;Neuromuscular re-education;Manual techniques;Passive range of motion    PT Next Visit Plan  Finalize HEP and send to me for D/C.  Focus on Eyes closed, backwards walking, heel toe.  Prone hip flexor stretches, prone hip extension, standing terminal hip extension.  balance with narrow BOS and on compliant surfaces;  PWR!    PT Home Exercise Plan  [email or web address]     Consulted and  Agree with Plan of Care  Patient       Patient will benefit from skilled therapeutic intervention in order to improve the following deficits and impairments:  Abnormal gait, Decreased activity tolerance, Decreased balance, Decreased endurance, Decreased safety awareness, Decreased range of motion, Difficulty walking, Decreased strength, Postural dysfunction  Visit Diagnosis: Other abnormalities of gait and mobility  Difficulty in walking, not elsewhere classified  Muscle weakness (generalized)     Problem List Patient Active Problem List   Diagnosis Date Noted  . Candida esophagitis ([place]) 09/22/2017  . History of smoking 30 or more pack years 01/05/2017  . Bipolar 1 disorder ([place])   . BPH (benign prostatic hyperplasia) 03/31/2013  . 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 Essential hypertension, benign 03/31/2013  . Chronic kidney disease (CKD), stage III (moderate) ([place]) 06/04/2012  . Crohn's regional enteritis [facility]) 01/23/2010    Rico Junker, PT, DPT 03/24/18    3:25 PM    [facility] [address] [place], [place], [address] Phone: [phone]   Fax:  [phone]  Name: HEITOR STEINHOFF MRN: [id number] Date of Birth: 1945/04/29

## 2018-03-26 ENCOUNTER — Encounter: Payer: Self-pay | Admitting: Physical Therapy

## 2018-03-26 ENCOUNTER — Ambulatory Visit: Payer: Medicare Other | Admitting: Physical Therapy

## 2018-03-26 DIAGNOSIS — R262 Difficulty in walking, not elsewhere classified: Secondary | ICD-10-CM | POA: Diagnosis not present

## 2018-03-26 DIAGNOSIS — H524 Presbyopia: Secondary | ICD-10-CM | POA: Diagnosis not present

## 2018-03-26 DIAGNOSIS — R2689 Other abnormalities of gait and mobility: Secondary | ICD-10-CM

## 2018-03-26 DIAGNOSIS — M6281 Muscle weakness (generalized): Secondary | ICD-10-CM

## 2018-03-26 DIAGNOSIS — H25013 Cortical age-related cataract, bilateral: Secondary | ICD-10-CM | POA: Diagnosis not present

## 2018-03-26 DIAGNOSIS — H52203 Unspecified astigmatism, bilateral: Secondary | ICD-10-CM | POA: Diagnosis not present

## 2018-03-26 DIAGNOSIS — H2513 Age-related nuclear cataract, bilateral: Secondary | ICD-10-CM | POA: Diagnosis not present

## 2018-03-26 DIAGNOSIS — H5203 Hypermetropia, bilateral: Secondary | ICD-10-CM | POA: Diagnosis not present

## 2018-03-26 NOTE — Patient Instructions (Signed)
Access Code: [email or web address]  URL: [email or web address]  Date: 03/26/2018  Prepared by: Willow Ora   Exercises  Backward Walking with Counter Support - 4 reps - 1 sets - 1x daily - 5x weekly  Tandem Walking with Counter Support - 1 sets - 4 reps - 1x daily - 5x weekly  Standing Hip Extension - 10 reps - 1 sets - 1x daily - 5x weekly  Sit to Stand with Arms Crossed - 5-10 reps - 1 sets - 1x daily - 5x weekly  Step Sideways with Arms Reaching - 5-10 reps - 1 sets - 1x daily - 5x weekly  Romberg Stance with Eyes Closed - 3 reps - 1 sets - 30 hold - 1x daily - 5x weekly

## 2018-03-27 NOTE — Therapy (Addendum)
[facility] [address] [place], [place], [address] Phone: [phone]   Fax:  [phone]  Physical Therapy Treatment and D/C Summary  Patient Details  Name: Brent [name] MRN: [id number] Date of Birth: 07/08/1945 Referring Provider (PT): Sherrie Mustache, NP   Encounter Date: 03/26/2018  PT End of Session - 03/26/18 1452    Visit Number  9    Number of Visits  12    Date for PT Re-Evaluation  04/01/18    Authorization Type  [employer] MCR - 10th visit PN    PT Start Time  3013    PT Stop Time  1524   discharge visit, not all time was needed   PT Time Calculation (min)  35 min    Equipment Utilized During Treatment  Gait belt    Activity Tolerance  Patient tolerated treatment well    Behavior During Therapy  [facility] for tasks assessed/performed       Past Medical History:  Diagnosis Date  . Abdominal pain, right lower quadrant   . Abdominal pain, unspecified site   . Acute cholecystitis   . Anemia   . Anemia, unspecified   . Anxiety   . Benign paroxysmal positional vertigo   . Bipolar I disorder, most recent episode (or current) unspecified   . Celiac disease   . Cellulitis and abscess of unspecified site   . Chronic kidney disease, stage III (moderate) (HCC)   . Cough   . Crohn's   . Depression   . Diarrhea   . Dizziness and giddiness   . Dizziness and giddiness   . Dizziness and giddiness   . Dysuria   . Edema   . Encounter for long-term (current) use of other medications   . Essential and other specified forms of tremor   . Essential hypertension, benign   . Gout 2018  . Hypertensive renal disease, benign   . Hypertrophy of prostate without urinary obstruction and other lower urinary tract symptoms (LUTS)   . Hypopotassemia   . Impacted cerumen   . Impotence of organic origin   . Insomnia with sleep apnea, unspecified   . Insomnia with sleep apnea, unspecified   . Iron deficiency anemia, unspecified    . Loss of weight   . Malnutrition of mild degree ([place])   . 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 Nausea with vomiting   . Neuralgia, neuritis, and radiculitis, unspecified   . Other abnormal blood chemistry   . Other B-complex deficiencies   . Other B-complex deficiencies   . Other extrapyramidal disease and abnormal movement disorder   . Other extrapyramidal disease and abnormal movement disorder   . Other malaise and fatigue   . Other specified disease of white blood cells   . Other specified disease of white blood cells   . Pain in joint, lower leg   . Pain in limb   . Postinflammatory pulmonary fibrosis ([place])   . Regional enteritis of small intestine ([place])   . Regional enteritis of small intestine with large intestine ([place])   . Renal insufficiency   . Tobacco use disorder   . Unspecified disorder of skin and subcutaneous tissue   . Vertigo 2018  . Viral warts, unspecified   . Viremia, unspecified     Past Surgical History:  Procedure Laterality Date  . CHOLECYSTECTOMY  07-12-2010  . SMALL INTESTINE SURGERY     x 2    There were no vitals filed for this visit.  Subjective Assessment - 03/26/18 1451    Subjective  No new complaints. No falls or pain to report.     Pertinent History  PMH: anx/dep,BPPV,bipolar,celiac/crohn's,CKD III,dizziness,chronic leg pain    Limitations  Lifting;Standing;Walking    How long can you stand comfortably?  10 min    How long can you walk comfortably?  usuall grocery store but did have an episode last week where he could not make in through store and had to use cart    Currently in Pain?  No/denies          [facility] Adult PT Treatment/Exercise - 03/27/18 0006      Exercises   Exercises  Other Exercises    Other Exercises   reviewed pt's HEP. modified and advanced program today with cues on form/technique. min guard to min assist for balance with balance ex's.        issued the following to pt's HEP today.   Access Code: [email or web address]  URL:  [email or web address]  Date: 03/26/2018  Prepared by: Willow Ora   Exercises  Backward Walking with Counter Support - 4 reps - 1 sets - 1x daily - 5x weekly  Tandem Walking with Counter Support - 1 sets - 4 reps - 1x daily - 5x weekly  Standing Hip Extension - 10 reps - 1 sets - 1x daily - 5x weekly  Sit to Stand with Arms Crossed - 5-10 reps - 1 sets - 1x daily - 5x weekly  Step Sideways with Arms Reaching - 5-10 reps - 1 sets - 1x daily - 5x weekly  Romberg Stance with Eyes Closed - 3 reps - 1 sets - 30 hold - 1x daily - 5x weekly       PT Education - 03/26/18 1522    Education Details  Updated HEP    Person(s) Educated  Patient    Methods  Explanation;Demonstration;Verbal cues;Handout    Comprehension  Verbalized understanding;Returned demonstration          PT Long Term Goals - 03/26/18 1656      PT LONG TERM GOAL #1   Title  Pt will be I and compliant with HEP. 6 weeks 04/01/18    Baseline  03/26/18: met today    Status  Achieved      PT LONG TERM GOAL #2   Title  Pt will improve 5TSTS to less than 18 sec to show improved balance and endurance.  6 weeks 04/01/18    Baseline  16 seconds, no UE     Status  Achieved      PT LONG TERM GOAL #3   Title  Pt will improve gait speed in 10MWT <10 sec. 6 weeks 04/01/18    Baseline  8 seconds    Status  Achieved      PT LONG TERM GOAL #4   Title  Pt will improve BERG to at least 46 to show improved balance and decreased risk for falls. 6 weeks 04/01/18    Baseline  51/56    Status  Achieved      PT LONG TERM GOAL #5   Title  Pt will be able to ambulate 750 feet on varrying surfaces with LRAD and negotiate curb and vary speeds with functional gait pattern.      6 weeks 04/01/18    Status  Achieved      PT LONG TERM GOAL #6   Title  Pt will improve FGA score by 3 points to indicate decreased risk for falls  Baseline  24/30 > 22/30    Status  Not Met        03/26/18 1452  Plan  Clinical Impression Statement  Today's skilled session focused on revision/advancement of pt's HEP with LTG number 1 met. Pt agreeable to discharge today.   Pt will benefit from skilled therapeutic intervention in order to improve on the following deficits Abnormal gait;Decreased activity tolerance;Decreased balance;Decreased endurance;Decreased safety awareness;Decreased range of motion;Difficulty walking;Decreased strength;Postural dysfunction  Rehab Potential Good  PT Frequency 2x / week  PT Duration 6 weeks  PT Treatment/Interventions Moist Heat;Therapeutic exercise;Balance training;Neuromuscular re-education;Manual techniques;Passive range of motion  PT Next Visit Plan discharge per PT plan of care.   PT Home Exercise Plan [email or web address]   Consulted and Agree with Plan of Care Patient            Patient will benefit from skilled therapeutic intervention in order to improve the following deficits and impairments:  Abnormal gait, Decreased activity tolerance, Decreased balance, Decreased endurance, Decreased safety awareness, Decreased range of motion, Difficulty walking, Decreased strength, Postural dysfunction  Visit Diagnosis: Other abnormalities of gait and mobility  Difficulty in walking, not elsewhere classified  Muscle weakness (generalized)     Problem List Patient Active Problem List   Diagnosis Date Noted  . Candida esophagitis ([place]) 09/22/2017  . History of smoking 30 or more pack years 01/05/2017  . Bipolar 1 disorder ([place])   . BPH (benign prostatic hyperplasia) 03/31/2013  . 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 Essential hypertension, benign 03/31/2013  . Chronic kidney disease (CKD), stage III (moderate) ([place]) 06/04/2012  . Crohn's regional enteritis [facility]) 01/23/2010    Willow Ora, PTA, [place] [address], [place], [place] [address] [phone] 03/27/18, 12:08 AM    PHYSICAL THERAPY DISCHARGE SUMMARY  Visits from Start of Care:  9  Current functional level related to goals / functional outcomes: See impression statement and LTG achievement above   Remaining deficits: Impaired posture, strength and balance   Education / Equipment: HEP  Plan: Patient agrees to discharge.  Patient goals were met. Patient is being discharged due to meeting the stated rehab goals.  ?????    Rico Junker, PT, DPT 03/30/18    4:54 PM     Name: Brent [name] MRN: [id number] Date of Birth: June 15, 1945

## 2018-03-29 ENCOUNTER — Encounter (HOSPITAL_COMMUNITY)
Admission: RE | Admit: 2018-03-29 | Discharge: 2018-03-29 | Disposition: A | Discharge status: Home / Self Care | Payer: Medicare Other | Source: Ambulatory Visit | Attending: Gastroenterology | Admitting: Gastroenterology

## 2018-03-29 ENCOUNTER — Encounter (HOSPITAL_COMMUNITY): Payer: Self-pay

## 2018-03-29 DIAGNOSIS — D6489 Other specified anemias: Secondary | ICD-10-CM | POA: Insufficient documentation

## 2018-03-29 DIAGNOSIS — N183 Chronic kidney disease, stage 3 (moderate): Secondary | ICD-10-CM | POA: Insufficient documentation

## 2018-03-29 LAB — IRON AND TIBC
Iron: 44 ug/dL — ABNORMAL LOW (ref 45–182)
Saturation Ratios: 20 % (ref 17.9–39.5)
TIBC: 221 ug/dL — ABNORMAL LOW (ref 250–450)
UIBC: 177 ug/dL

## 2018-03-29 LAB — FERRITIN: Ferritin: 884 ng/mL — ABNORMAL HIGH (ref 24–336)

## 2018-03-29 LAB — HEMOGLOBIN: Hemoglobin: 9.4 g/dL — ABNORMAL LOW (ref 13.0–17.0)

## 2018-03-29 MED ORDER — EPOETIN ALFA 10000 UNIT/ML IJ SOLN
10000.0000 [IU] | INTRAMUSCULAR | Status: DC
  Administered 2018-03-29: 10000 [IU] via SUBCUTANEOUS
  Filled 2018-03-29 (×2): qty 1

## 2018-04-07 DIAGNOSIS — H2512 Age-related nuclear cataract, left eye: Secondary | ICD-10-CM | POA: Diagnosis not present

## 2018-04-07 DIAGNOSIS — H25811 Combined forms of age-related cataract, right eye: Secondary | ICD-10-CM | POA: Diagnosis not present

## 2018-04-07 DIAGNOSIS — H25012 Cortical age-related cataract, left eye: Secondary | ICD-10-CM | POA: Diagnosis not present

## 2018-04-08 DIAGNOSIS — K50918 Crohn's disease, unspecified, with other complication: Secondary | ICD-10-CM | POA: Diagnosis not present

## 2018-04-09 DIAGNOSIS — M7742 Metatarsalgia, left foot: Secondary | ICD-10-CM | POA: Diagnosis not present

## 2018-04-09 DIAGNOSIS — M2041 Other hammer toe(s) (acquired), right foot: Secondary | ICD-10-CM | POA: Diagnosis not present

## 2018-04-09 DIAGNOSIS — B351 Tinea unguium: Secondary | ICD-10-CM | POA: Diagnosis not present

## 2018-04-09 DIAGNOSIS — I739 Peripheral vascular disease, unspecified: Secondary | ICD-10-CM | POA: Diagnosis not present

## 2018-04-09 DIAGNOSIS — L84 Corns and callosities: Secondary | ICD-10-CM | POA: Diagnosis not present

## 2018-04-14 DIAGNOSIS — H25011 Cortical age-related cataract, right eye: Secondary | ICD-10-CM | POA: Diagnosis not present

## 2018-04-14 DIAGNOSIS — H2511 Age-related nuclear cataract, right eye: Secondary | ICD-10-CM | POA: Diagnosis not present

## 2018-04-15 ENCOUNTER — Encounter (HOSPITAL_COMMUNITY): Payer: Self-pay

## 2018-04-21 ENCOUNTER — Other Ambulatory Visit (HOSPITAL_COMMUNITY): Payer: Self-pay | Admitting: Foot & Ankle Surgery

## 2018-04-21 ENCOUNTER — Other Ambulatory Visit (HOSPITAL_COMMUNITY): Payer: Self-pay | Admitting: Student

## 2018-04-21 ENCOUNTER — Other Ambulatory Visit (HOSPITAL_COMMUNITY): Payer: Self-pay | Admitting: Pediatric Infectious Disease

## 2018-04-21 DIAGNOSIS — R0989 Other specified symptoms and signs involving the circulatory and respiratory systems: Secondary | ICD-10-CM

## 2018-04-29 ENCOUNTER — Other Ambulatory Visit: Payer: Self-pay

## 2018-04-29 ENCOUNTER — Encounter (HOSPITAL_COMMUNITY): Payer: Self-pay

## 2018-04-29 ENCOUNTER — Encounter (HOSPITAL_COMMUNITY)
Admission: RE | Admit: 2018-04-29 | Discharge: 2018-04-29 | Disposition: A | Discharge status: Home / Self Care | Payer: Medicare Other | Source: Ambulatory Visit | Attending: Gastroenterology | Admitting: Gastroenterology

## 2018-04-29 DIAGNOSIS — D631 Anemia in chronic kidney disease: Secondary | ICD-10-CM | POA: Insufficient documentation

## 2018-04-29 DIAGNOSIS — N183 Chronic kidney disease, stage 3 (moderate): Secondary | ICD-10-CM | POA: Insufficient documentation

## 2018-04-29 LAB — IRON AND TIBC
Iron: 78 ug/dL (ref 45–182)
Saturation Ratios: 31 % (ref 17.9–39.5)
TIBC: 253 ug/dL (ref 250–450)
UIBC: 175 ug/dL

## 2018-04-29 LAB — FERRITIN: Ferritin: 730 ng/mL — ABNORMAL HIGH (ref 24–336)

## 2018-04-29 LAB — HEMOGLOBIN: Hemoglobin: 10.6 g/dL — ABNORMAL LOW (ref 13.0–17.0)

## 2018-04-29 MED ORDER — EPOETIN ALFA 10000 UNIT/ML IJ SOLN
10000.0000 [IU] | INTRAMUSCULAR | Status: DC
  Administered 2018-04-29: 10000 [IU] via SUBCUTANEOUS
  Filled 2018-04-29: qty 1

## 2018-04-30 ENCOUNTER — Ambulatory Visit (INDEPENDENT_AMBULATORY_CARE_PROVIDER_SITE_OTHER): Payer: Medicare Other | Admitting: Family

## 2018-04-30 ENCOUNTER — Encounter: Payer: Self-pay | Admitting: Family

## 2018-04-30 VITALS — BP 130/64 | HR 98 | Temp 97.5°F | Ht 76.0 in | Wt 162.6 lb

## 2018-04-30 DIAGNOSIS — H6123 Impacted cerumen, bilateral: Secondary | ICD-10-CM | POA: Diagnosis not present

## 2018-04-30 MED ORDER — CARBAMIDE PEROXIDE 6.5 % OT SOLN: 5.0000 [drp] | Freq: Two times a day (BID) | OTIC | 0 refills | Status: AC

## 2018-04-30 NOTE — Patient Instructions (Addendum)
Debrox 6.5% otic solution instil 5 drops into each ear twice daily x 4 days then follow up for ear lavage.   Earwax Buildup, Adult The ears produce a substance called earwax that helps keep bacteria out of the ear and protects the skin in the ear canal. Occasionally, earwax can build up in the ear and cause discomfort or hearing loss. What increases the risk? This condition is more likely to develop in people who:  Are male.  Are elderly.  Naturally produce more earwax.  Clean their ears often with cotton swabs.  Use earplugs often.  Use in-ear headphones often.  Wear hearing aids.  Have narrow ear canals.  Have earwax that is overly thick or sticky.  Have eczema.  Are dehydrated.  Have excess hair in the ear canal. What are the signs or symptoms? Symptoms of this condition include:  Reduced or muffled hearing.  A feeling of fullness in the ear or feeling that the ear is plugged.  Fluid coming from the ear.  Ear pain.  Ear itch.  Ringing in the ear.  Coughing.  An obvious piece of earwax that can be seen inside the ear canal. How is this diagnosed? This condition may be diagnosed based on:  Your symptoms.  Your medical history.  An ear exam. During the exam, your health care provider will look into your ear with an instrument called an otoscope. You may have tests, including a hearing test. How is this treated? This condition may be treated by:  Using ear drops to soften the earwax.  Having the earwax removed by a health care provider. The health care provider may: ? Flush the ear with water. ? Use an instrument that has a loop on the end (curette). ? Use a suction device.  Surgery to remove the wax buildup. This may be done in severe cases. Follow these instructions at home:   Take over-the-counter and prescription medicines only as told by your health care provider.  Do not put any objects, including cotton swabs, into your ear. You can clean  the opening of your ear canal with a washcloth or facial tissue.  Follow instructions from your health care provider about cleaning your ears. Do not over-clean your ears.  Drink enough fluid to keep your urine clear or pale yellow. This will help to thin the earwax.  Keep all follow-up visits as told by your health care provider. If earwax builds up in your ears often or if you use hearing aids, consider seeing your health care provider for routine, preventive ear cleanings. Ask your health care provider how often you should schedule your cleanings.  If you have hearing aids, clean them according to instructions from the manufacturer and your health care provider. Contact a health care provider if:  You have ear pain.  You develop a fever.  You have blood, pus, or other fluid coming from your ear.  You have hearing loss.  You have ringing in your ears that does not go away.  Your symptoms do not improve with treatment.  You feel like the room is spinning (vertigo). Summary  Earwax can build up in the ear and cause discomfort or hearing loss.  The most common symptoms of this condition include reduced or muffled hearing and a feeling of fullness in the ear or feeling that the ear is plugged.  This condition may be diagnosed based on your symptoms, your medical history, and an ear exam.  This condition may be treated by  using ear drops to soften the earwax or by having the earwax removed by a health care provider.  Do not put any objects, including cotton swabs, into your ear. You can clean the opening of your ear canal with a washcloth or facial tissue. This information is not intended to replace advice given to you by your health care provider. Make sure you discuss any questions you have with your health care provider. Document Released: 03/20/2004 Document Revised: 01/22/2017 Document Reviewed: 04/23/2016 Elsevier Interactive Patient Education  2019 [employer].

## 2018-04-30 NOTE — Progress Notes (Signed)
Provider: Kaison Mcparland FNP-C  Gayland Curry, DO  Patient Care Team: Gayland Curry, DO as PCP - General (Geriatric Medicine) Elmarie Shiley, MD (Nephrology) Milus Banister, MD (Gastroenterology)  Extended Emergency Contact Information Primary Emergency Contact: [place] Mobile Phone: [phone] Relation: Sister  Goals of care: Advanced Directive information Advanced Directives 02/18/2018  Does Patient Have a Medical Advance Directive? No  Type of Advance Directive -  Does patient want to make changes to medical advance directive? -  Copy of [place] in Chart? -  Would patient like information on creating a medical advance directive? No - Patient declined  Pre-existing out of facility DNR order (yellow form or pink MOST form) -     Chief Complaint  Patient presents with  . Acute Visit    Patient states he can not hear out of right ear duration of 1 day     HPI:  Pt is a 73 y.o. male seen today for an acute visit for evaluation of inability to hear on right ear x 1 day.He denies any ear pain,rinnging or drainage from ear.Also denies any nasal drainage,cough or sore throat.No use of Q-tip uses or injury to ear.    Past Medical History:  Diagnosis Date  . Abdominal pain, right lower quadrant   . Abdominal pain, unspecified site   . Acute cholecystitis   . Anemia   . Anemia, unspecified   . Anxiety   . Benign paroxysmal positional vertigo   . Bipolar I disorder, most recent episode (or current) unspecified   . Celiac disease   . Cellulitis and abscess of unspecified site   . Chronic kidney disease, stage III (moderate) (HCC)   . Cough   . Crohn's   . Depression   . Diarrhea   . Dizziness and giddiness   . Dizziness and giddiness   . Dizziness and giddiness   . Dysuria   . Edema   . Encounter for long-term (current) use of other medications   . Essential and other specified forms of tremor   . Essential hypertension, benign   . Gout  2018  . Hypertensive renal disease, benign   . Hypertrophy of prostate without urinary obstruction and other lower urinary tract symptoms (LUTS)   . Hypopotassemia   . Impacted cerumen   . Impotence of organic origin   . Insomnia with sleep apnea, unspecified   . Insomnia with sleep apnea, unspecified   . Iron deficiency anemia, unspecified   . Loss of weight   . Malnutrition of mild degree ([place])   . 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 Nausea with vomiting   . Neuralgia, neuritis, and radiculitis, unspecified   . Other abnormal blood chemistry   . Other B-complex deficiencies   . Other B-complex deficiencies   . Other extrapyramidal disease and abnormal movement disorder   . Other extrapyramidal disease and abnormal movement disorder   . Other malaise and fatigue   . Other specified disease of white blood cells   . Other specified disease of white blood cells   . Pain in joint, lower leg   . Pain in limb   . Postinflammatory pulmonary fibrosis ([place])   . Regional enteritis of small intestine ([place])   . 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 Renal insufficiency   . Tobacco use disorder   . Unspecified disorder of skin and subcutaneous tissue   . Vertigo 2018  . Viral warts, unspecified   .  Viremia, unspecified    Past Surgical History:  Procedure Laterality Date  . CHOLECYSTECTOMY  07-12-2010  . SMALL INTESTINE SURGERY     x 2    Allergies  Allergen Reactions  . Azathioprine     REACTION: affected WBC  . Ciprofloxacin     Pt denies any reaction  . Levaquin [Levofloxacin In D5w]     Pt denies any reaction  . Plendil [Felodipine]     Pt denies any reaction    Outpatient Encounter Medications as of 04/30/2018  Medication Sig  . allopurinol (ZYLOPRIM) 100 MG tablet Take 2 tablets (200 mg total) by mouth daily.  Marland Kitchen amLODipine (NORVASC) 2.5 MG tablet Take 2.5 mg by mouth daily.  . Calcium Carbonate-Vit D-Min 600-400 MG-UNIT TABS Take 1 tablet by mouth 3  (three) times daily.   . cholecalciferol (VITAMIN D) 1000 units tablet Take 1,000 Units by mouth daily.   . divalproex (DEPAKOTE) 500 MG EC tablet Take 1,500 mg by mouth at bedtime.   Marland Kitchen epoetin alfa (PROCRIT) 98119 UNIT/ML injection Inject 10,000 Units into the skin every 30 (thirty) days.   . ferrous sulfate 325 (65 FE) MG tablet Take 325 mg by mouth 2 (two) times daily.    Marland Kitchen inFLIXimab (REMICADE) 100 MG injection Infuse Remicade IV schedule 1 4m/kg every 8 weeks Premedicate with Tylenol 500-6571mby mouth and Benadryl 25-5095my mouth prior to infusion. Last PPD was on 12/2009.   . Loperamide HCl (LOPERAMIDE A-D PO) Take 1 capsule by mouth as needed (loose stools).   . magnesium oxide (MAG-OX) 400 (241.3 Mg) MG tablet Take 2,400 mg by mouth 3 (three) times daily.   . meclizine (ANTIVERT) 25 MG tablet Take 25 mg by mouth 3 (three) times daily as needed for dizziness.   . metoprolol succinate (TOPROL-XL) 50 MG 24 hr tablet Take 50 mg by mouth daily. Take with or immediately following a meal.  . mirabegron ER (MYRBETRIQ) 25 MG TB24 tablet Take 1 tablet (25 mg total) by mouth daily.  . Multiple Vitamin (MULTIVITAMIN) tablet Take 1 tablet by mouth daily.    . nMarland Kitchenstatin (MYCOSTATIN) 100000 UNIT/ML suspension Use as directed 5 mLs (500,000 Units total) in the mouth or throat 4 (four) times daily.  . OMarland KitchenANZapine (ZYPREXA) 7.5 MG tablet Take 1 tablet (7.5 mg total) by mouth at bedtime.  . oMarland Kitcheneprazole (PRILOSEC) 20 MG capsule Take 20 mg by mouth daily before supper.    . prazosin (MINIPRESS) 5 MG capsule Take 5 mg by mouth at bedtime.  . vitamin E 400 UNIT capsule Take 400 Units by mouth daily.     No facility-administered encounter medications on file as of 04/30/2018.     Review of Systems  Constitutional: Negative for chills and fever.  HENT: Positive for hearing loss. Negative for congestion, ear discharge, ear pain, rhinorrhea, sinus pressure, sinus pain, sneezing, sore throat and tinnitus.     Eyes: Positive for visual disturbance. Negative for pain, discharge, redness and itching.       Wears eye glasses status had recent surgery to eyes.  Respiratory: Negative for cough, chest tightness, shortness of breath and wheezing.   Skin: Negative for color change and pallor.  Neurological: Negative for dizziness, light-headedness and headaches.    Immunization History  Administered Date(s) Administered  . Influenza, High Dose Seasonal PF 11/26/2016  . Influenza,inj,Quad PF,6+ Mos 11/13/2014, 01/03/2016  . Influenza-Unspecified 10/25/2012, 11/24/2016, 11/24/2017  . PPD Test 12/30/2010, 01/05/2012, 01/07/2013, 01/13/2014  . Pneumococcal Conjugate-13 08/10/2014  .  Pneumococcal Polysaccharide-23 03/05/2004, 01/03/2016  . Tdap 05/26/2011   Pertinent  Health Maintenance Due  Topic Date Due  . COLONOSCOPY  09/17/2027  . INFLUENZA VACCINE  Completed  . PNA vac Low Risk Adult  Completed   Fall Risk  04/30/2018 03/12/2018 02/10/2018 02/02/2018 01/28/2018  Falls in the past year? 0 1 1 0 0  Number falls in past yr: 0 0 1 - 0  Injury with Fall? 0 0 0 - 0    Vitals:   04/30/18 1504  BP: 130/64  Pulse: 98  Temp: (!) 97.5 F (36.4 C)  TempSrc: Oral  SpO2: 96%  Weight: 162 lb 9.6 oz (73.8 kg)  Height: 6' 4"  (1.93 m)   Body mass index is 19.79 kg/m. Physical Exam Constitutional:      General: He is not in acute distress.    Appearance: He is normal weight.  HENT:     Head: Normocephalic.     Right Ear: There is impacted cerumen.     Left Ear: There is impacted cerumen.     Ears:     Comments: TM not visualized due to cerumen impaction     Nose: No congestion or rhinorrhea.     Mouth/Throat:     Mouth: Mucous membranes are moist.     Pharynx: Oropharynx is clear. No oropharyngeal exudate or posterior oropharyngeal erythema.  Eyes:     General: No scleral icterus.       Right eye: No discharge.        Left eye: No discharge.     Conjunctiva/sclera: Conjunctivae normal.      Pupils: Pupils are equal, round, and reactive to light.  Neck:     Musculoskeletal: Normal range of motion. No neck rigidity or muscular tenderness.     Vascular: No carotid bruit.  Cardiovascular:     Rate and Rhythm: Normal rate and regular rhythm.     Pulses: Normal pulses.     Heart sounds: Normal heart sounds. No murmur. No friction rub. No gallop.   Pulmonary:     Effort: Pulmonary effort is normal. No respiratory distress.     Breath sounds: Normal breath sounds. No wheezing, rhonchi or rales.  Chest:     Chest wall: No tenderness.  Lymphadenopathy:     Cervical: No cervical adenopathy.  Neurological:     Mental Status: He is alert and oriented to person, place, and time.     Cranial Nerves: No cranial nerve deficit.     Coordination: Coordination normal.     Comments: HOH   Psychiatric:        Mood and Affect: Mood normal.        Behavior: Behavior normal.        Thought Content: Thought content normal.        Judgment: Judgment normal.    Labs reviewed: Recent Labs    06/18/17 1214 02/07/18 1255 02/07/18 1313 03/12/18 1556  NA 141 138 139 140  K 3.5 5.0 5.0 4.6  CL 111 104 103 111*  CO2 23 25  --  24  GLUCOSE 103* 98 91 73  BUN 14 29* 28* 28*  CREATININE 1.69* 2.14* 2.20* 1.67*  CALCIUM 8.5* 9.2  --  8.4*   Recent Labs    05/31/17 1203 06/11/17 06/18/17 1214 02/07/18 1255 03/12/18 1556  AST 13* 9* 14* 28 12  ALT 7* 6* 8* 22 5*  ALKPHOS 53 55 46 63  --   BILITOT 0.5  --  0.5 0.4 0.3  PROT 7.1  --  6.9 6.9 6.4  ALBUMIN 3.2*  --  3.0* 3.2*  --    Recent Labs    05/04/17 0903  05/31/17 1203 06/11/17 06/18/17 1214  02/07/18 1255 02/07/18 1313 02/26/18 1115 03/29/18 1031 04/29/18 1301  WBC 8.6  --  9.5 8.3 8.8  --  6.2  --   --   --   --   NEUTROABS 4,773  --  6.2  --   --   --  4.4  --   --   --   --   HGB 11.3*   < > 11.7* 10.6* 10.0*   < > 11.3* 12.2* 11.0* 9.4* 10.6*  HCT 34.3*  --  37.6* 33* 31.7*  --  36.2* 36.0*  --   --   --   MCV 98.6   --  103.6*  --  102.6*  --  108.7*  --   --   --   --   PLT 180  --  190 171 187  --  145*  --   --   --   --    < > = values in this interval not displayed.   Lab Results  Component Value Date   TSH 2.66 02/10/2018   Lab Results  Component Value Date   HGBA1C 4.9 08/10/2015   Lab Results  Component Value Date   CHOL 138 05/04/2017   HDL 42 05/04/2017   LDLCALC 71 05/04/2017   TRIG 176 (H) 05/04/2017   CHOLHDL 3.3 05/04/2017    Significant Diagnostic Results in last 30 days:  No results found.  Assessment/Plan   Bilateral impacted cerumen - Afebrile.Bilateral ear cerumen impaction unable to visualize Tympanic membrane.No drainage noted.  - instructed to instil Debrox 6.5% otic solution 5 drops into each ear twice daily x 4 days then follow up for ear lavage.   Family/ staff Communication: Reviewed plan of care with patient.  Labs/tests ordered: None   Simonne Boulos C Jessie Cowher, NP

## 2018-05-04 ENCOUNTER — Ambulatory Visit (HOSPITAL_BASED_OUTPATIENT_CLINIC_OR_DEPARTMENT_OTHER)
Admission: RE | Admit: 2018-05-04 | Discharge: 2018-05-04 | Disposition: A | Discharge status: Home / Self Care | Payer: Medicare Other | Source: Ambulatory Visit | Attending: Cardiology | Admitting: Cardiology

## 2018-05-04 DIAGNOSIS — R0989 Other specified symptoms and signs involving the circulatory and respiratory systems: Secondary | ICD-10-CM | POA: Insufficient documentation

## 2018-05-04 DIAGNOSIS — Z23 Encounter for immunization: Secondary | ICD-10-CM | POA: Diagnosis not present

## 2018-05-04 DIAGNOSIS — J180 Bronchopneumonia, unspecified organism: Secondary | ICD-10-CM | POA: Diagnosis not present

## 2018-05-04 DIAGNOSIS — K5090 Crohn's disease, unspecified, without complications: Secondary | ICD-10-CM | POA: Diagnosis not present

## 2018-05-04 DIAGNOSIS — I2510 Atherosclerotic heart disease of native coronary artery without angina pectoris: Secondary | ICD-10-CM | POA: Diagnosis not present

## 2018-05-04 DIAGNOSIS — E872 Acidosis: Secondary | ICD-10-CM | POA: Diagnosis not present

## 2018-05-04 DIAGNOSIS — N183 Chronic kidney disease, stage 3 (moderate): Secondary | ICD-10-CM | POA: Diagnosis not present

## 2018-05-04 DIAGNOSIS — I083 Combined rheumatic disorders of mitral, aortic and tricuspid valves: Secondary | ICD-10-CM | POA: Diagnosis not present

## 2018-05-04 DIAGNOSIS — I5043 Acute on chronic combined systolic (congestive) and diastolic (congestive) heart failure: Secondary | ICD-10-CM | POA: Diagnosis not present

## 2018-05-04 DIAGNOSIS — I420 Dilated cardiomyopathy: Secondary | ICD-10-CM | POA: Diagnosis not present

## 2018-05-04 DIAGNOSIS — I130 Hypertensive heart and chronic kidney disease with heart failure and stage 1 through stage 4 chronic kidney disease, or unspecified chronic kidney disease: Secondary | ICD-10-CM | POA: Diagnosis not present

## 2018-05-04 DIAGNOSIS — G4730 Sleep apnea, unspecified: Secondary | ICD-10-CM | POA: Diagnosis not present

## 2018-05-04 DIAGNOSIS — J9601 Acute respiratory failure with hypoxia: Secondary | ICD-10-CM | POA: Diagnosis not present

## 2018-05-04 DIAGNOSIS — D631 Anemia in chronic kidney disease: Secondary | ICD-10-CM | POA: Diagnosis not present

## 2018-05-04 DIAGNOSIS — Z9049 Acquired absence of other specified parts of digestive tract: Secondary | ICD-10-CM | POA: Diagnosis not present

## 2018-05-04 DIAGNOSIS — R6520 Severe sepsis without septic shock: Secondary | ICD-10-CM | POA: Diagnosis not present

## 2018-05-04 DIAGNOSIS — A419 Sepsis, unspecified organism: Secondary | ICD-10-CM | POA: Diagnosis not present

## 2018-05-04 DIAGNOSIS — Z87891 Personal history of nicotine dependence: Secondary | ICD-10-CM | POA: Diagnosis not present

## 2018-05-04 DIAGNOSIS — K900 Celiac disease: Secondary | ICD-10-CM | POA: Diagnosis not present

## 2018-05-04 DIAGNOSIS — J189 Pneumonia, unspecified organism: Secondary | ICD-10-CM | POA: Diagnosis not present

## 2018-05-04 DIAGNOSIS — R0602 Shortness of breath: Secondary | ICD-10-CM | POA: Diagnosis not present

## 2018-05-04 DIAGNOSIS — I472 Ventricular tachycardia: Secondary | ICD-10-CM | POA: Diagnosis not present

## 2018-05-04 DIAGNOSIS — J810 Acute pulmonary edema: Secondary | ICD-10-CM | POA: Diagnosis not present

## 2018-05-04 DIAGNOSIS — G4700 Insomnia, unspecified: Secondary | ICD-10-CM | POA: Diagnosis not present

## 2018-05-04 DIAGNOSIS — J188 Other pneumonia, unspecified organism: Secondary | ICD-10-CM | POA: Diagnosis not present

## 2018-05-04 DIAGNOSIS — J440 Chronic obstructive pulmonary disease with acute lower respiratory infection: Secondary | ICD-10-CM | POA: Diagnosis not present

## 2018-05-04 DIAGNOSIS — R918 Other nonspecific abnormal finding of lung field: Secondary | ICD-10-CM | POA: Diagnosis not present

## 2018-05-05 ENCOUNTER — Other Ambulatory Visit: Payer: Self-pay

## 2018-05-05 ENCOUNTER — Encounter (HOSPITAL_COMMUNITY): Payer: Self-pay | Admitting: *Deleted

## 2018-05-05 ENCOUNTER — Emergency Department (HOSPITAL_COMMUNITY): Payer: Medicare Other

## 2018-05-05 ENCOUNTER — Ambulatory Visit: Payer: Medicare Other | Admitting: Family

## 2018-05-05 ENCOUNTER — Inpatient Hospital Stay (HOSPITAL_COMMUNITY)
Admission: EM | Admit: 2018-05-05 | Discharge: 2018-05-10 | DRG: 871 | Disposition: A | Discharge status: Home / Self Care | Payer: Medicare Other | Attending: Internal Medicine | Admitting: Internal Medicine

## 2018-05-05 ENCOUNTER — Inpatient Hospital Stay (HOSPITAL_COMMUNITY): Payer: Medicare Other

## 2018-05-05 DIAGNOSIS — I420 Dilated cardiomyopathy: Secondary | ICD-10-CM | POA: Diagnosis present

## 2018-05-05 DIAGNOSIS — I130 Hypertensive heart and chronic kidney disease with heart failure and stage 1 through stage 4 chronic kidney disease, or unspecified chronic kidney disease: Secondary | ICD-10-CM | POA: Diagnosis present

## 2018-05-05 DIAGNOSIS — N400 Enlarged prostate without lower urinary tract symptoms: Secondary | ICD-10-CM | POA: Diagnosis present

## 2018-05-05 DIAGNOSIS — R0602 Shortness of breath: Secondary | ICD-10-CM

## 2018-05-05 DIAGNOSIS — R918 Other nonspecific abnormal finding of lung field: Secondary | ICD-10-CM | POA: Diagnosis not present

## 2018-05-05 DIAGNOSIS — Z23 Encounter for immunization: Secondary | ICD-10-CM

## 2018-05-05 DIAGNOSIS — Z87891 Personal history of nicotine dependence: Secondary | ICD-10-CM

## 2018-05-05 DIAGNOSIS — J80 Acute respiratory distress syndrome: Secondary | ICD-10-CM | POA: Diagnosis not present

## 2018-05-05 DIAGNOSIS — N183 Chronic kidney disease, stage 3 unspecified: Secondary | ICD-10-CM | POA: Diagnosis present

## 2018-05-05 DIAGNOSIS — I5021 Acute systolic (congestive) heart failure: Secondary | ICD-10-CM

## 2018-05-05 DIAGNOSIS — Z9049 Acquired absence of other specified parts of digestive tract: Secondary | ICD-10-CM

## 2018-05-05 DIAGNOSIS — E872 Acidosis: Secondary | ICD-10-CM | POA: Diagnosis present

## 2018-05-05 DIAGNOSIS — K5090 Crohn's disease, unspecified, without complications: Secondary | ICD-10-CM | POA: Diagnosis present

## 2018-05-05 DIAGNOSIS — R6520 Severe sepsis without septic shock: Secondary | ICD-10-CM | POA: Diagnosis present

## 2018-05-05 DIAGNOSIS — F319 Bipolar disorder, unspecified: Secondary | ICD-10-CM | POA: Diagnosis present

## 2018-05-05 DIAGNOSIS — I083 Combined rheumatic disorders of mitral, aortic and tricuspid valves: Secondary | ICD-10-CM | POA: Diagnosis present

## 2018-05-05 DIAGNOSIS — G4730 Sleep apnea, unspecified: Secondary | ICD-10-CM | POA: Diagnosis present

## 2018-05-05 DIAGNOSIS — J440 Chronic obstructive pulmonary disease with acute lower respiratory infection: Secondary | ICD-10-CM | POA: Diagnosis present

## 2018-05-05 DIAGNOSIS — J188 Other pneumonia, unspecified organism: Secondary | ICD-10-CM | POA: Diagnosis not present

## 2018-05-05 DIAGNOSIS — K50011 Crohn's disease of small intestine with rectal bleeding: Secondary | ICD-10-CM | POA: Diagnosis not present

## 2018-05-05 DIAGNOSIS — R0902 Hypoxemia: Secondary | ICD-10-CM | POA: Diagnosis not present

## 2018-05-05 DIAGNOSIS — I2510 Atherosclerotic heart disease of native coronary artery without angina pectoris: Secondary | ICD-10-CM | POA: Diagnosis present

## 2018-05-05 DIAGNOSIS — Z825 Family history of asthma and other chronic lower respiratory diseases: Secondary | ICD-10-CM

## 2018-05-05 DIAGNOSIS — J180 Bronchopneumonia, unspecified organism: Secondary | ICD-10-CM | POA: Diagnosis present

## 2018-05-05 DIAGNOSIS — K900 Celiac disease: Secondary | ICD-10-CM | POA: Diagnosis present

## 2018-05-05 DIAGNOSIS — I5041 Acute combined systolic (congestive) and diastolic (congestive) heart failure: Secondary | ICD-10-CM | POA: Diagnosis not present

## 2018-05-05 DIAGNOSIS — D631 Anemia in chronic kidney disease: Secondary | ICD-10-CM | POA: Diagnosis not present

## 2018-05-05 DIAGNOSIS — J189 Pneumonia, unspecified organism: Secondary | ICD-10-CM

## 2018-05-05 DIAGNOSIS — I509 Heart failure, unspecified: Secondary | ICD-10-CM | POA: Diagnosis not present

## 2018-05-05 DIAGNOSIS — G4700 Insomnia, unspecified: Secondary | ICD-10-CM | POA: Diagnosis present

## 2018-05-05 DIAGNOSIS — J9601 Acute respiratory failure with hypoxia: Secondary | ICD-10-CM | POA: Diagnosis not present

## 2018-05-05 DIAGNOSIS — N179 Acute kidney failure, unspecified: Secondary | ICD-10-CM | POA: Diagnosis present

## 2018-05-05 DIAGNOSIS — I340 Nonrheumatic mitral (valve) insufficiency: Secondary | ICD-10-CM

## 2018-05-05 DIAGNOSIS — J810 Acute pulmonary edema: Secondary | ICD-10-CM | POA: Diagnosis not present

## 2018-05-05 DIAGNOSIS — I472 Ventricular tachycardia: Secondary | ICD-10-CM | POA: Diagnosis not present

## 2018-05-05 DIAGNOSIS — J984 Other disorders of lung: Secondary | ICD-10-CM | POA: Diagnosis not present

## 2018-05-05 DIAGNOSIS — Z888 Allergy status to other drugs, medicaments and biological substances status: Secondary | ICD-10-CM

## 2018-05-05 DIAGNOSIS — R000 Tachycardia, unspecified: Secondary | ICD-10-CM | POA: Diagnosis not present

## 2018-05-05 DIAGNOSIS — I5043 Acute on chronic combined systolic (congestive) and diastolic (congestive) heart failure: Secondary | ICD-10-CM | POA: Diagnosis present

## 2018-05-05 DIAGNOSIS — Z881 Allergy status to other antibiotic agents status: Secondary | ICD-10-CM

## 2018-05-05 DIAGNOSIS — I10 Essential (primary) hypertension: Secondary | ICD-10-CM | POA: Diagnosis present

## 2018-05-05 DIAGNOSIS — A419 Sepsis, unspecified organism: Principal | ICD-10-CM | POA: Diagnosis present

## 2018-05-05 DIAGNOSIS — Z833 Family history of diabetes mellitus: Secondary | ICD-10-CM

## 2018-05-05 DIAGNOSIS — R0689 Other abnormalities of breathing: Secondary | ICD-10-CM | POA: Diagnosis not present

## 2018-05-05 DIAGNOSIS — Z79899 Other long term (current) drug therapy: Secondary | ICD-10-CM

## 2018-05-05 DIAGNOSIS — N189 Chronic kidney disease, unspecified: Secondary | ICD-10-CM

## 2018-05-05 DIAGNOSIS — Z8049 Family history of malignant neoplasm of other genital organs: Secondary | ICD-10-CM

## 2018-05-05 LAB — LACTIC ACID, PLASMA
LACTIC ACID, VENOUS: 4 mmol/L — AB (ref 0.5–1.9)
LACTIC ACID, VENOUS: 3.3 mmol/L — AB (ref 0.5–1.9)
Lactic Acid, Venous: 2.3 mmol/L (ref 0.5–1.9)
Lactic Acid, Venous: 3.8 mmol/L (ref 0.5–1.9)
Lactic Acid, Venous: 3.5 mmol/L (ref 0.5–1.9)

## 2018-05-05 LAB — POCT I-STAT EG7
Acid-base deficit: 3 mmol/L — ABNORMAL HIGH (ref 0.0–2.0)
BICARBONATE: 22.8 mmol/L (ref 20.0–28.0)
Calcium, Ion: 1.15 mmol/L (ref 1.15–1.40)
HCT: 33 % — ABNORMAL LOW (ref 39.0–52.0)
Hemoglobin: 11.2 g/dL — ABNORMAL LOW (ref 13.0–17.0)
O2 Saturation: 89 %
POTASSIUM: 3.9 mmol/L (ref 3.5–5.1)
Sodium: 143 mmol/L (ref 135–145)
TCO2: 24 mmol/L (ref 22–32)
pCO2, Ven: 45.3 mmHg (ref 44.0–60.0)
pH, Ven: 7.31 (ref 7.250–7.430)
pO2, Ven: 62 mmHg — ABNORMAL HIGH (ref 32.0–45.0)

## 2018-05-05 LAB — BASIC METABOLIC PANEL
Anion gap: 8 (ref 5–15)
BUN: 17 mg/dL (ref 8–23)
CO2: 25 mmol/L (ref 22–32)
Calcium: 8.5 mg/dL — ABNORMAL LOW (ref 8.9–10.3)
Chloride: 109 mmol/L (ref 98–111)
Creatinine, Ser: 2.12 mg/dL — ABNORMAL HIGH (ref 0.61–1.24)
GFR calc Af Amer: 35 mL/min — ABNORMAL LOW (ref >60)
GFR calc non Af Amer: 30 mL/min — ABNORMAL LOW (ref >60)
Glucose, Bld: 158 mg/dL — ABNORMAL HIGH (ref 70–99)
Potassium: 3.6 mmol/L (ref 3.5–5.1)
Sodium: 142 mmol/L (ref 135–145)

## 2018-05-05 LAB — CBC
HCT: 35.4 % — ABNORMAL LOW (ref 39.0–52.0)
Hemoglobin: 10.6 g/dL — ABNORMAL LOW (ref 13.0–17.0)
MCH: 32.7 pg (ref 26.0–34.0)
MCHC: 29.9 g/dL — ABNORMAL LOW (ref 30.0–36.0)
MCV: 109.3 fL — AB (ref 80.0–100.0)
NRBC: 0.2 % (ref 0.0–0.2)
Platelets: 211 10*3/uL (ref 150–400)
RBC: 3.24 MIL/uL — ABNORMAL LOW (ref 4.22–5.81)
RDW: 13.3 % (ref 11.5–15.5)
WBC: 12.9 10*3/uL — ABNORMAL HIGH (ref 4.0–10.5)

## 2018-05-05 LAB — VALPROIC ACID LEVEL: Valproic Acid Lvl: 18 ug/mL — ABNORMAL LOW (ref 50.0–100.0)

## 2018-05-05 LAB — ECHOCARDIOGRAM COMPLETE

## 2018-05-05 LAB — TROPONIN I

## 2018-05-05 LAB — I-STAT TROPONIN, ED: Troponin i, poc: 0 ng/mL (ref 0.00–0.08)

## 2018-05-05 LAB — BRAIN NATRIURETIC PEPTIDE: B Natriuretic Peptide: 490.9 pg/mL — ABNORMAL HIGH (ref 0.0–100.0)

## 2018-05-05 LAB — I-STAT CREATININE, ED: Creatinine, Ser: 2.1 mg/dL — ABNORMAL HIGH (ref 0.61–1.24)

## 2018-05-05 LAB — PROCALCITONIN: Procalcitonin: <0.1 ng/mL

## 2018-05-05 MED ORDER — ACETAMINOPHEN 650 MG RE SUPP: 650.0000 mg | Freq: Four times a day (QID) | RECTAL | Status: DC | PRN

## 2018-05-05 MED ORDER — FUROSEMIDE 10 MG/ML IJ SOLN
20.0000 mg | Freq: Once | INTRAMUSCULAR | Status: AC
  Administered 2018-05-05: 20 mg via INTRAVENOUS
  Filled 2018-05-05: qty 2

## 2018-05-05 MED ORDER — METOPROLOL SUCCINATE ER 50 MG PO TB24
50.0000 mg | ORAL_TABLET | Freq: Every day | ORAL | Status: DC
  Administered 2018-05-05 – 2018-05-10 (×6): 50 mg via ORAL
  Filled 2018-05-05 (×3): qty 1
  Filled 2018-05-05: qty 2
  Filled 2018-05-05 (×2): qty 1

## 2018-05-05 MED ORDER — FERROUS SULFATE 325 (65 FE) MG PO TABS
325.0000 mg | ORAL_TABLET | Freq: Two times a day (BID) | ORAL | Status: DC
  Administered 2018-05-05 – 2018-05-10 (×10): 325 mg via ORAL
  Filled 2018-05-05 (×10): qty 1

## 2018-05-05 MED ORDER — FUROSEMIDE 10 MG/ML IJ SOLN: 40.0000 mg | Freq: Two times a day (BID) | INTRAMUSCULAR | Status: DC

## 2018-05-05 MED ORDER — PANTOPRAZOLE SODIUM 40 MG PO TBEC
40.0000 mg | DELAYED_RELEASE_TABLET | Freq: Every day | ORAL | Status: DC
  Administered 2018-05-05 – 2018-05-10 (×6): 40 mg via ORAL
  Filled 2018-05-05 (×6): qty 1

## 2018-05-05 MED ORDER — SODIUM CHLORIDE 0.9% FLUSH
3.0000 mL | Freq: Two times a day (BID) | INTRAVENOUS | Status: DC
  Administered 2018-05-06 – 2018-05-10 (×6): 3 mL via INTRAVENOUS

## 2018-05-05 MED ORDER — SODIUM CHLORIDE 0.9 % IV BOLUS
500.0000 mL | Freq: Once | INTRAVENOUS | Status: AC
  Administered 2018-05-05: 500 mL via INTRAVENOUS

## 2018-05-05 MED ORDER — OLANZAPINE 7.5 MG PO TABS
7.5000 mg | ORAL_TABLET | Freq: Every day | ORAL | Status: DC
  Filled 2018-05-05 (×2): qty 1

## 2018-05-05 MED ORDER — ENOXAPARIN SODIUM 40 MG/0.4ML ~~LOC~~ SOLN
40.0000 mg | SUBCUTANEOUS | Status: DC
  Administered 2018-05-05 – 2018-05-10 (×6): 40 mg via SUBCUTANEOUS
  Filled 2018-05-05 (×6): qty 0.4

## 2018-05-05 MED ORDER — SODIUM CHLORIDE 0.9 % IV SOLN
2.0000 g | INTRAVENOUS | Status: DC
  Administered 2018-05-06 – 2018-05-09 (×4): 2 g via INTRAVENOUS
  Filled 2018-05-05 (×4): qty 20

## 2018-05-05 MED ORDER — POLYETHYLENE GLYCOL 3350 17 G PO PACK
17.0000 g | PACK | Freq: Every day | ORAL | Status: DC | PRN
  Administered 2018-05-06: 17 g via ORAL
  Filled 2018-05-05: qty 1

## 2018-05-05 MED ORDER — ONDANSETRON HCL 4 MG/2ML IJ SOLN: 4.0000 mg | Freq: Four times a day (QID) | INTRAMUSCULAR | Status: DC | PRN

## 2018-05-05 MED ORDER — DOCUSATE SODIUM 100 MG PO CAPS
100.0000 mg | ORAL_CAPSULE | Freq: Two times a day (BID) | ORAL | Status: DC
  Administered 2018-05-05 – 2018-05-08 (×6): 100 mg via ORAL
  Filled 2018-05-05 (×7): qty 1

## 2018-05-05 MED ORDER — ALBUTEROL SULFATE (2.5 MG/3ML) 0.083% IN NEBU
2.5000 mg | INHALATION_SOLUTION | RESPIRATORY_TRACT | Status: DC | PRN
  Administered 2018-05-05: 2.5 mg via RESPIRATORY_TRACT
  Filled 2018-05-05: qty 3

## 2018-05-05 MED ORDER — ALLOPURINOL 100 MG PO TABS
200.0000 mg | ORAL_TABLET | Freq: Every day | ORAL | Status: DC
  Administered 2018-05-05 – 2018-05-10 (×6): 200 mg via ORAL
  Filled 2018-05-05 (×6): qty 2

## 2018-05-05 MED ORDER — VANCOMYCIN HCL 10 G IV SOLR
1500.0000 mg | Freq: Once | INTRAVENOUS | Status: AC
  Administered 2018-05-05: 1500 mg via INTRAVENOUS
  Filled 2018-05-05: qty 1500

## 2018-05-05 MED ORDER — ONDANSETRON HCL 4 MG PO TABS: 4.0000 mg | ORAL_TABLET | Freq: Four times a day (QID) | ORAL | Status: DC | PRN

## 2018-05-05 MED ORDER — AMLODIPINE BESYLATE 5 MG PO TABS
2.5000 mg | ORAL_TABLET | Freq: Every day | ORAL | Status: DC
  Administered 2018-05-05 – 2018-05-07 (×3): 2.5 mg via ORAL
  Filled 2018-05-05 (×3): qty 1

## 2018-05-05 MED ORDER — PNEUMOCOCCAL VAC POLYVALENT 25 MCG/0.5ML IJ INJ
0.5000 mL | INJECTION | INTRAMUSCULAR | Status: AC
  Administered 2018-05-06: 0.5 mL via INTRAMUSCULAR
  Filled 2018-05-05: qty 0.5

## 2018-05-05 MED ORDER — MIRABEGRON ER 25 MG PO TB24
25.0000 mg | ORAL_TABLET | Freq: Every day | ORAL | Status: DC
  Administered 2018-05-05 – 2018-05-10 (×6): 25 mg via ORAL
  Filled 2018-05-05 (×6): qty 1

## 2018-05-05 MED ORDER — SODIUM CHLORIDE 0.9% FLUSH
3.0000 mL | Freq: Once | INTRAVENOUS | Status: AC
  Administered 2018-05-05: 3 mL via INTRAVENOUS

## 2018-05-05 MED ORDER — ACETAMINOPHEN 325 MG PO TABS: 650.0000 mg | ORAL_TABLET | Freq: Four times a day (QID) | ORAL | Status: DC | PRN

## 2018-05-05 MED ORDER — MAGNESIUM OXIDE 400 (241.3 MG) MG PO TABS
800.0000 mg | ORAL_TABLET | Freq: Three times a day (TID) | ORAL | Status: DC
  Administered 2018-05-05 – 2018-05-10 (×16): 800 mg via ORAL
  Filled 2018-05-05 (×16): qty 2

## 2018-05-05 MED ORDER — SODIUM CHLORIDE 0.9 % IV SOLN
1.0000 g | Freq: Once | INTRAVENOUS | Status: AC
  Administered 2018-05-05: 1 g via INTRAVENOUS
  Filled 2018-05-05: qty 1

## 2018-05-05 MED ORDER — HYDROCORTISONE NA SUCCINATE PF 100 MG IJ SOLR
50.0000 mg | Freq: Four times a day (QID) | INTRAMUSCULAR | Status: DC
  Administered 2018-05-05 – 2018-05-06 (×6): 50 mg via INTRAVENOUS
  Filled 2018-05-05 (×6): qty 2

## 2018-05-05 MED ORDER — SODIUM CHLORIDE 0.9 % IV SOLN
500.0000 mg | INTRAVENOUS | Status: DC
  Administered 2018-05-05 – 2018-05-08 (×4): 500 mg via INTRAVENOUS
  Filled 2018-05-05 (×4): qty 500

## 2018-05-05 MED ORDER — SODIUM CHLORIDE 0.9 % IV SOLN
INTRAVENOUS | Status: DC
  Administered 2018-05-05 – 2018-05-06 (×3): via INTRAVENOUS

## 2018-05-05 MED ORDER — FUROSEMIDE 10 MG/ML IJ SOLN
40.0000 mg | Freq: Once | INTRAMUSCULAR | Status: AC
  Administered 2018-05-05: 40 mg via INTRAVENOUS
  Filled 2018-05-05: qty 4

## 2018-05-05 MED ORDER — EPOETIN ALFA 10000 UNIT/ML IJ SOLN: 10000.0000 [IU] | INTRAMUSCULAR | Status: DC

## 2018-05-05 MED ORDER — SODIUM CHLORIDE 0.9 % IV SOLN: 1.0000 g | INTRAVENOUS | Status: DC

## 2018-05-05 MED ORDER — DIVALPROEX SODIUM 500 MG PO DR TAB
1500.0000 mg | DELAYED_RELEASE_TABLET | Freq: Every day | ORAL | Status: DC
  Administered 2018-05-05 – 2018-05-09 (×5): 1500 mg via ORAL
  Filled 2018-05-05 (×6): qty 3

## 2018-05-05 MED ORDER — PRAZOSIN HCL 2 MG PO CAPS
5.0000 mg | ORAL_CAPSULE | Freq: Every day | ORAL | Status: DC
  Administered 2018-05-05 – 2018-05-09 (×5): 5 mg via ORAL
  Filled 2018-05-05 (×6): qty 1

## 2018-05-05 MED ORDER — SODIUM CHLORIDE 0.9 % IV BOLUS
1000.0000 mL | Freq: Once | INTRAVENOUS | Status: AC
  Administered 2018-05-05: 1000 mL via INTRAVENOUS

## 2018-05-05 MED ORDER — SODIUM CHLORIDE 0.9 % IV SOLN: 2.0000 g | INTRAVENOUS | Status: DC

## 2018-05-05 MED ORDER — OLANZAPINE 5 MG PO TABS
7.5000 mg | ORAL_TABLET | Freq: Every day | ORAL | Status: DC
  Administered 2018-05-05 – 2018-05-09 (×5): 7.5 mg via ORAL
  Filled 2018-05-05 (×6): qty 1.5

## 2018-05-05 NOTE — ED Notes (Signed)
Per Claiborne Billings, RN outgoing nurse from night shift, blood cultures were drawn despite no order for them. Upon this Rn contacted lab, they stated they had no blood cultures for patient. Cultures thereforer were redrawn after the start of maxipime

## 2018-05-05 NOTE — H&P (Signed)
History and Physical    Brent [name] YPP:[id number] DOB: 1945-10-18 DOA: 05/05/2018  PCP: Gayland Curry, DO Consultants:  Rexene Alberts - neurology; Ninfa Linden - orthopedics; Posey Pronto - nephrology Patient coming from:  Home - lives alone; NOK: Sister, [phone]  Chief Complaint: SOB  HPI: Brent [name] is a 73 y.o. male with medical history significant of Crohn's; celiac disease; HTN; COPD with remote tobacco dependence; and bipolar d/o presenting with SOB.  About 0200 this AM, he woke up and was wet with sweat and couldn't breathe.  He was wheezing and extremely SOB.  He was totally fine last night when he went to bed.  He has not been coughing.  No URI symptoms, fever, etc prior to going to bed last night.  He feels markedly better now.  They gave him medications with EMS but continued to have respiratory distress until he was in the ER.  He is currently off BIPAP and feeling much better.   ED Course:  Acute SOB.  Both pulmonary edema and PNA.  Has Crohn's, immunocompromised. On BIPAP with improvement.  Given Cefepime and Vanc.  Given Mag, solumedrol per EMS.  CT with multifocal PNA and mild interstitial edema and COPD.  Review of Systems: As per HPI; otherwise review of systems reviewed and negative.   Ambulatory Status:  Ambulates without assistance  Past Medical History:  Diagnosis Date  . Anemia   . Anxiety   . Benign paroxysmal positional vertigo   . Bipolar I disorder, most recent episode (or current) unspecified   . Celiac disease   . Cellulitis and abscess of unspecified site   . Chronic kidney disease, stage III (moderate) (HCC)   . Crohn's    Remicade q8 weeks  . Depression   . Dizziness and giddiness   . Essential and other specified forms of tremor    medication-induced Parkinson's, now resolved  . Essential hypertension, benign   . Gout 2018  . Hypertrophy of prostate without urinary obstruction and other lower urinary tract symptoms (LUTS)   . Hypopotassemia   . Impotence  of organic origin   . Insomnia with sleep apnea, unspecified   . Iron deficiency anemia, unspecified   . Loss of weight   . Malnutrition of mild degree ([place])   . 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 Neuralgia, neuritis, and radiculitis, unspecified   . Other B-complex deficiencies   . Other extrapyramidal disease and abnormal movement disorder   . Postinflammatory pulmonary fibrosis ([place])   . Tobacco use disorder   . Vertigo 2018  . Viral warts, unspecified     Past Surgical History:  Procedure Laterality Date  . CHOLECYSTECTOMY  07-12-2010  . SMALL INTESTINE SURGERY     x 2    Social History   Socioeconomic History  . Marital status: Divorced    Spouse name: Not on file  . Number of children: 1  . Years of education: Not on file  . Highest education level: Not on file  Occupational History  . Occupation: retired  . Occupation: [employer]  . Financial resource strain: Not hard at all  . Food insecurity:    Worry: Never true    Inability: Never true  . Transportation needs:    Medical: No    Non-medical: No  Tobacco Use  . Smoking status: Former Smoker    Packs/day: 1.00    Years: 49.00    Pack years: 49.00    Types: Cigarettes    Start date: 04/11/1956  Last attempt to quit: 04/17/2014    Years since quitting: 4.0  . Smokeless tobacco: Never Used  Substance and Sexual Activity  . Alcohol use: No    Alcohol/week: 0.0 standard drinks  . Drug use: No  . Sexual activity: Never  Lifestyle  . Physical activity:    Days per week: 0 days    Minutes per session: 0 min  . Stress: Not at all  Relationships  . Social connections:    Talks on phone: More than three times a week    Gets together: Three times a week    Attends religious service: More than 4 times per year    Active member of club or organization: Yes    Attends meetings of clubs or organizations: 1 to 4 times per year    Relationship status: Divorced  . Intimate partner violence:    Fear of current  or ex partner: No    Emotionally abused: No    Physically abused: No    Forced sexual activity: No  Other Topics Concern  . Not on file  Social History Narrative  . Not on file    Allergies  Allergen Reactions  . Azathioprine     REACTION: affected WBC  . Ciprofloxacin     Pt denies any reaction  . Levaquin [Levofloxacin In D5w]     Pt denies any reaction  . Plendil [Felodipine]     Pt denies any reaction    Family History  Problem Relation Age of Onset  . Diabetes Mother        maternal grandmother  . Uterine cancer Mother   . Emphysema Father   . Pneumonia Maternal Grandmother   . Colon cancer Neg Hx     Prior to Admission medications   Medication Sig Start Date End Date Taking? Authorizing Provider  allopurinol (ZYLOPRIM) 100 MG tablet Take 2 tablets (200 mg total) by mouth daily. 04/10/16   Lauree Chandler, NP  amLODipine (NORVASC) 2.5 MG tablet Take 2.5 mg by mouth daily.    [provider]  Calcium Carbonate-Vit D-Min 600-400 MG-UNIT TABS Take 1 tablet by mouth 3 (three) times daily.     [provider]  cholecalciferol (VITAMIN D) 1000 units tablet Take 1,000 Units by mouth daily.     [provider]  divalproex (DEPAKOTE) 500 MG EC tablet Take 1,500 mg by mouth at bedtime.     [provider]  epoetin alfa (PROCRIT) 73532 UNIT/ML injection Inject 10,000 Units into the skin every 30 (thirty) days.     [provider]  ferrous sulfate 325 (65 FE) MG tablet Take 325 mg by mouth 2 (two) times daily.      [provider]  inFLIXimab (REMICADE) 100 MG injection Infuse Remicade IV schedule 1 72m/kg every 8 weeks Premedicate with Tylenol 500-6533mby mouth and Benadryl 25-5050my mouth prior to infusion. Last PPD was on 12/2009.  11/22/10   JacMilus BanisterD  Loperamide HCl (LOPERAMIDE A-D PO) Take 1 capsule by mouth as needed (loose stools).     [provider]  magnesium oxide (MAG-OX) 400 (241.3 Mg) MG  tablet Take 2,400 mg by mouth 3 (three) times daily.  04/17/17   [provider]  meclizine (ANTIVERT) 25 MG tablet Take 25 mg by mouth 3 (three) times daily as needed for dizziness.  06/21/16   [provider]  metoprolol succinate (TOPROL-XL) 50 MG 24 hr tablet Take 50 mg by mouth daily.  Take with or immediately following a meal.    [provider]  mirabegron ER (MYRBETRIQ) 25 MG TB24 tablet Take 1 tablet (25 mg total) by mouth daily. 02/10/18   Lauree Chandler, NP  Multiple Vitamin (MULTIVITAMIN) tablet Take 1 tablet by mouth daily.      [provider]  nystatin (MYCOSTATIN) 100000 UNIT/ML suspension Use as directed 5 mLs (500,000 Units total) in the mouth or throat 4 (four) times daily. 02/02/18   Lauree Chandler, NP  OLANZapine (ZYPREXA) 7.5 MG tablet Take 1 tablet (7.5 mg total) by mouth at bedtime. 01/03/16   Reed, Tiffany L, DO  omeprazole (PRILOSEC) 20 MG capsule Take 20 mg by mouth daily before supper.      [provider]  prazosin (MINIPRESS) 5 MG capsule Take 5 mg by mouth at bedtime.    [provider]  vitamin E 400 UNIT capsule Take 400 Units by mouth daily.      [provider]    Physical Exam: Vitals:   05/05/18 0830 05/05/18 0900 05/05/18 0930 05/05/18 1000  BP: (!) 128/95 (!) 156/96 124/69 (!) 141/85  Pulse: 99 (!) 109 100 (!) 108  Resp: 15 (!) 22 (!) 21   Temp:      TempSrc:      SpO2: 95% 96% 97% 95%     . General:  Appears calm and comfortable and is NAD, on 2L [place] O2 . Eyes:   EOMI, normal lids, iris . ENT:  grossly normal hearing, lips & tongue, mmm . Neck:  no LAD, masses or thyromegaly . Cardiovascular:  RRR, no m/r/g. No LE edema.  Marland Kitchen Respiratory:   Bibasilar crackles still present.   Otherwise, normal respiratory exam and effort. . Abdomen:  soft, NT, ND, NABS . Back:   normal alignment, no CVAT . Skin:  no rash or induration seen on limited exam . Musculoskeletal:  grossly normal tone  BUE/BLE, good ROM, no bony abnormality . Psychiatric:  grossly normal mood and affect, speech fluent and appropriate, AOx3 . Neurologic:  CN 2-12 grossly intact, moves all extremities in coordinated fashion, sensation intact    Radiological Exams on Admission: Ct Chest Wo Contrast  Result Date: 05/05/2018 CLINICAL DATA:  Shortness of breath.  Abnormal chest x-ray EXAM: CT CHEST WITHOUT CONTRAST TECHNIQUE: Multidetector CT imaging of the chest was performed following the standard protocol without IV contrast. COMPARISON:  Chest x-ray from earlier today.  01/20/2017 chest CT FINDINGS: Cardiovascular: No cardiomegaly. Small pericardial effusion that is slightly increased. No acute vascular finding. Coronary atherosclerosis. Mediastinum/Nodes: Mild nodal prominence considered reactive in this setting. Lungs/Pleura: There is generalized airway thickening. Mild patchy nodular and airspace opacities in the right middle and lower lobes. Patchy ground-glass density in the bilateral apex. There is dependent atelectasis and septal thickening with trace pleural effusions. Mild emphysema Upper Abdomen: Cholecystectomy which accounts for common bile duct prominence where visualized. Bilateral partially covered renal cystic densities. Musculoskeletal: No acute finding. IMPRESSION: 1. Multifocal pneumonia. 2. Mild interstitial edema with trace pleural effusions and pericardial effusion. 3. Lower lobe atelectasis. 4. Mild emphysema. Electronically Signed   By: Monte Fantasia M.D.   On: 05/05/2018 05:46   Dg Chest Portable 1 View  Result Date: 05/05/2018 CLINICAL DATA:  Shortness of breath EXAM: PORTABLE CHEST 1 VIEW COMPARISON:  05/31/2017 FINDINGS: Mild cardiac enlargement. New perihilar infiltration could represent edema or multifocal pneumonia. No blunting of costophrenic angles. No pneumothorax. Mediastinal contours appear intact. IMPRESSION: New perihilar infiltration suggesting  edema or multifocal pneumonia.  Cardiac enlargement. Electronically Signed   By: Lucienne Capers M.D.   On: 05/05/2018 04:02   Vas [name] Le Art Seg Multi (segm&le Reynauds)  Result Date: 05/04/2018 LOWER EXTREMITY DOPPLER STUDY Indications: Problematic painful, thickened and elongated toenails. High Risk Factors: Hypertension, past history of smoking. Other Factors: Barely palpable tibial pulses, bilaterally, as well as decreased                capillary refill time to all toes. Skin on the calves and feet is                shiny and thin.  Comparison Study: None Performing Technologist: [employer] BS, RVT, RDCS  Examination Guidelines: A complete evaluation includes at minimum, Doppler waveform signals and systolic blood pressure reading at the level of bilateral brachial, anterior tibial, and posterior tibial arteries, when vessel segments are accessible. Bilateral testing is considered an integral part of a complete examination. Photoelectric Plethysmograph (PPG) waveforms and toe systolic pressure readings are included as required and additional duplex testing as needed. Limited examinations for reoccurring indications may be performed as noted.  ABI Findings: +---------+------------------+-----+---------+--------+ Right    Rt Pressure (mmHg)IndexWaveform Comment  +---------+------------------+-----+---------+--------+ Brachial 118                                      +---------+------------------+-----+---------+--------+ CFA                             triphasic         +---------+------------------+-----+---------+--------+ Popliteal                       triphasic         +---------+------------------+-----+---------+--------+ ATA      139               1.18 triphasic         +---------+------------------+-----+---------+--------+ PTA      144               1.22 triphasic         +---------+------------------+-----+---------+--------+ PERO     144               1.22 triphasic          +---------+------------------+-----+---------+--------+ Great Toe143               1.21 Normal            +---------+------------------+-----+---------+--------+ +---------+------------------+-----+---------+-------+ Left     Lt Pressure (mmHg)IndexWaveform Comment +---------+------------------+-----+---------+-------+ Brachial 113                                     +---------+------------------+-----+---------+-------+ CFA                             triphasic        +---------+------------------+-----+---------+-------+ Popliteal                       triphasic        +---------+------------------+-----+---------+-------+ ATA      142               1.20 triphasic        +---------+------------------+-----+---------+-------+ PTA  136               1.15 triphasic        +---------+------------------+-----+---------+-------+ PERO     137               1.16 triphasic        +---------+------------------+-----+---------+-------+ Great Toe144               1.22 Normal           +---------+------------------+-----+---------+-------+ +-------+-----------+-----------+------------+------------+ ABI/TBIToday's ABIToday's TBIPrevious ABIPrevious TBI +-------+-----------+-----------+------------+------------+ Right  1.22       1.21                                +-------+-----------+-----------+------------+------------+ Left   1.2        1.22                                +-------+-----------+-----------+------------+------------+ TOES Findings: +----------+---------------+--------+-------+ Right ToesPressure (mmHg)WaveformComment +----------+---------------+--------+-------+ 1st Digit 143            Normal          +----------+---------------+--------+-------+ 2nd Digit                Normal          +----------+---------------+--------+-------+ 3rd Digit                Normal           +----------+---------------+--------+-------+ 4th Digit                Normal          +----------+---------------+--------+-------+ 5th Digit                Normal          +----------+---------------+--------+-------+ +---------+---------------+--------+-------+ Left ToesPressure (mmHg)WaveformComment +---------+---------------+--------+-------+ 1st [id number]            Normal          +---------+---------------+--------+-------+ 2nd Digit               Normal          +---------+---------------+--------+-------+ 3rd Digit               Normal          +---------+---------------+--------+-------+ 4th Digit               Normal          +---------+---------------+--------+-------+ 5th Digit               Normal          +---------+---------------+--------+-------+   Summary: Right: Resting right ankle-brachial index is within normal range. No evidence of significant right lower extremity arterial disease. The right toe-brachial index is normal. RT great toe pressure = 143 mmHg. Left: Resting left ankle-brachial index is within normal range. No evidence of significant left lower extremity arterial disease. The left toe-brachial index is normal. LT Great toe pressure = 144 mmHg.  *See table(s) above for measurements and observations.  Electronically signed by Kathlyn Sacramento MD on 05/04/2018 at 5:10:29 PM.    Final     EKG: Independently reviewed.  NSR with rate 99; LVH; nonspecific ST changes with no evidence of acute ischemia   Labs on Admission: I have personally reviewed the available labs and imaging studies at the time of the admission.  Pertinent labs:   ABG: 7.310/45.3/62.0/22.8 BNP 490.0  Troponin 0.00 Glucose 158 BUN 17/2.12/35; 28/1.67/4 on 1/17, appears to be at/near baseline WBC 12.9 Hgb 10.6  Assessment/Plan Principal Problem:   Acute respiratory failure with hypoxia (HCC) Active Problems:   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HCC)   Chronic kidney  disease (CKD), stage III (moderate) (HCC)   Anemia in chronic kidney disease   Essential hypertension, benign   Bipolar 1 disorder (HCC)   Multifocal pneumonia   Flash pulmonary edema (HCC)   Acute respiratory failure -Patient without known respiratory compromise presenting with acute onset of respiratory failure -Marked improvement with BIPAP, now off BIPAP and on [place] O2 with ongoing improvement -Suspect this was related to a combination of PNA and flash pulmonary edema (see below)  Multifocal PNA with sepsis physiology -SIRS criteria in this patient includes: Leukocytosis, tachycardia, tachypnea, and hypoxia  -Patient has evidence of acute organ failure with elevated lactate  -While awaiting blood cultures, this appears to be a preseptic condition. -Sepsis protocol initiated -Suspected source is PNA - which correlates with respiratory compromise and apparent multifocal infiltrates appreciated on both CXR and chest CT  -CURB-65 score is 1 - will admit the patient  -Pneumonia Severity Index (PSI) is Class 4, 9% mortality. -Corticosteroids have been to shown to low overall mortality rate; risk of ARDS; and need for mechanical ventilation.  This is particularly true in severe PNA (class 3+ PSI).  Will add stress-dosed steroids since this patient is immunocompromised due to Remicade infusions. -Will start Azithromycin 500 mg daily AND Rocephin due to no risk factors for MDR cause. -Additional complicating factors include: hypoxia/hypoxemia; and immunocompromise. As a result, will admit the patient to the hospital. -Hold IVF for now given apparent flash pulmonary edema. -Fever control -Repeat CBC in am -Sputum cultures -Blood cultures -Will order lower respiratory tract procalcitonin level.  Antibiotics would not be indicated for PCT <0.1 and probably should not be used for < 0.25.  >0.5 indicates infection and >>0.5 indicates more serious disease.  As the procalcitonin level normalizes, it  will be reasonable to consider de-escalation of antibiotic coverage. -Will trend lactate to ensure improvement  Flash pulmonary edema -Patient without significant respiratory symptoms prior to acute respiratory episode. -Suspect that he was developing PNA and that this led to R heart strain with resultant flash pulmonary edema. -This improved with BIPAP and he is now stable from a respiratory standpoint.   -Will continue [place] O2. -He still has crackles and so will continue Lasix 40 mg IV BID for now. -BNP was elevated; this may be related to CKD but he does not have another known baseline. -Will check Echo.  Crohn's disease -He receives Remicade infusions and so is immunocompromised. -Currently without concern for flare.  Stage 3 CKD with anemia -Appears to be stable at this time. -On Procrit injections monthly. -Repeat BMP and CBC in AM.  HTN -Continue Norvasc and Toprol XL  Bipolar -Continue Depakote (check level), Zyprexa, and Prazosin.     DVT prophylaxis:  Lovenox Code Status:  Full - confirmed with patient/family Family Communication: None present Disposition Plan:  Home once clinically improved Consults called: None  Admission status: Admit - It is my clinical opinion that admission to INPATIENT is reasonable and necessary because of the expectation that this patient will require hospital care that crosses at least 2 midnights to treat this condition based on the medical complexity of the problems presented.  Given the aforementioned information, the predictability of an adverse outcome is felt to be significant.   Karmen Bongo  MD [facility]   How to contact the [facility] Attending or Consulting provider [place] or covering provider during after hours [place], for this patient?  1. Check the care team in [facility] and look for a) attending/consulting [facility] provider listed and b) the [facility] team listed 2. Log into [email or web address] and use [place]'s universal password to  access. If you do not have the password, please contact the hospital operator. 3. Locate the [facility] provider you are looking for under [facility] and page to a number that you can be directly reached. 4. If you still have difficulty reaching the provider, please page the [facility] (Director on Call) for the Hospitalists listed on amion for assistance.   05/05/2018, 10:22 AM

## 2018-05-05 NOTE — ED Notes (Signed)
Patient taken off of bipap and placed on 4L O2 via n.c. patient tolerating supplemental o2 without complication. O2 sat remains 95-100%

## 2018-05-05 NOTE — Progress Notes (Signed)
Lactate is uptrending.  Despite initial concern for flash pulmonary edema, given concern for sepsis with uptrending lactate, will bolus 500 cc, start MIVF, and repeat lactate.  Will cancel Lasix at this time.  Flash pulmonary edema is more likely associated with third spacing in the setting of sepsis than of true heart failure.  Echo is pending.  Carlyon Shadow, M.D.

## 2018-05-05 NOTE — ED Notes (Signed)
Confirmed with EDP that she does want ISTAT lab at this time.

## 2018-05-05 NOTE — ED Notes (Signed)
Dr bates at bedside

## 2018-05-05 NOTE — Progress Notes (Signed)
Sputum specimen container given to patient. He stated he has not been able cough anything up. Container left on bedside table in reach of patient. RT told patient to call RN if he was able to get anything.

## 2018-05-05 NOTE — Progress Notes (Signed)
Patient taken off BIPAP and placed on 4 Lpm nasal cannula. Patient now on phone speaking in full sentences, no SOB/ WOB. MD at bedside. RT will continue to monitor. Vitals stable.

## 2018-05-05 NOTE — ED Notes (Signed)
ED TO INPATIENT HANDOFF REPORT  ED Nurse Name and Phone #: Kriste Basque [phone] S Name/Age/Gender Brent [name] 73 y.o. male Room/Bed: TRACC/TRACC  Code Status   Code Status: Full Code  Home/SNF/Other Home Patient oriented to: self Is this baseline? Yes   Triage Complete: Triage complete  Chief Complaint cpap  Triage Note Pt arrives VIA EMS from home. Woke up 1 hour ago with SOB, felt like he was wheezing. Rhonchi throughout. 10 albuterol 0.5atrovent, 125 solumedrol, 2gm Magnesium. Sats were 92% RA, 40 RR on scene. CPAP placed. #16G in LFA. 169/114, hr122, cbg 169 WOB has improved.    Allergies Allergies  Allergen Reactions  . Azathioprine     REACTION: affected WBC  . Ciprofloxacin     Pt denies any reaction  . Levaquin [Levofloxacin In D5w]     Pt denies any reaction  . Plendil [Felodipine]     Pt denies any reaction    Level of Care/Admitting Diagnosis ED Disposition    ED Disposition Condition [facility]: [place] [[id number]]  Level of Care: Medical Telemetry [104]  Diagnosis: Acute respiratory failure with hypoxia [facility]) [[id number]]  Admitting Physician: Karmen Bongo [[id number]]  Attending Physician: Karmen Bongo [[id number]]  Estimated length of stay: past midnight tomorrow  Certification:: I certify this patient will need inpatient services for at least 2 midnights  PT Class (Do Not Modify): Inpatient [101]  PT Acc Code (Do Not Modify): Private [1]       B Medical/Surgery History Past Medical History:  Diagnosis Date  . Anemia   . Anxiety   . Benign paroxysmal positional vertigo   . Bipolar I disorder, most recent episode (or current) unspecified   . Celiac disease   . Cellulitis and abscess of unspecified site   . Chronic kidney disease, stage III (moderate) (HCC)   . Crohn's    Remicade q8 weeks  . Depression   . Dizziness and giddiness   . Essential and other specified forms of tremor    medication-induced  Parkinson's, now resolved  . Essential hypertension, benign   . Gout 2018  . Hypertrophy of prostate without urinary obstruction and other lower urinary tract symptoms (LUTS)   . Hypopotassemia   . Impotence of organic origin   . Insomnia with sleep apnea, unspecified   . Iron deficiency anemia, unspecified   . Loss of weight   . Malnutrition of mild degree ([place])   . 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 Neuralgia, neuritis, and radiculitis, unspecified   . Other B-complex deficiencies   . Other extrapyramidal disease and abnormal movement disorder   . Postinflammatory pulmonary fibrosis ([place])   . Tobacco use disorder   . Vertigo 2018  . Viral warts, unspecified    Past Surgical History:  Procedure Laterality Date  . CHOLECYSTECTOMY  07-12-2010  . SMALL INTESTINE SURGERY     x 2     A IV Location/Drains/Wounds Patient Lines/Drains/Airways Status   Active Line/Drains/Airways    Name:   Placement date:   Placement time:   Site:   Days:   Peripheral IV 05/05/18 Left Antecubital   05/05/18    0353    Antecubital   less than 1          Intake/Output Last 24 hours  Intake/Output Summary (Last 24 hours) at 05/05/2018 1454 Last data filed at 05/05/2018 1439 Gross per 24 hour  Intake 750 ml  Output 600 ml  Net 150 ml  Labs/Imaging Results for orders placed or performed during the hospital encounter of 05/05/18 (from the past 48 hour(s))  Basic metabolic panel     Status: Abnormal   Collection Time: 05/05/18  3:43 AM  Result Value Ref Range   Sodium 142 135 - 145 mmol/L   Potassium 3.6 3.5 - 5.1 mmol/L   Chloride 109 98 - 111 mmol/L   CO2 25 22 - 32 mmol/L   Glucose, Bld 158 (H) 70 - 99 mg/dL   BUN 17 8 - 23 mg/dL   Creatinine, Ser 2.12 (H) 0.61 - 1.24 mg/dL   Calcium 8.5 (L) 8.9 - 10.3 mg/dL   GFR calc non Af Amer 30 (L) >60 mL/min   GFR calc Af Amer 35 (L) >60 mL/min   Anion gap 8 5 - 15    Comment: Performed at [facility], [address] [place], [place] [address]   CBC     Status: Abnormal   Collection Time: 05/05/18  3:43 AM  Result Value Ref Range   WBC 12.9 (H) 4.0 - 10.5 K/uL   RBC 3.24 (L) 4.22 - 5.81 MIL/uL   Hemoglobin 10.6 (L) 13.0 - 17.0 g/dL   HCT 35.4 (L) 39.0 - 52.0 %   MCV 109.3 (H) 80.0 - 100.0 fL   MCH 32.7 26.0 - 34.0 pg   MCHC 29.9 (L) 30.0 - 36.0 g/dL   RDW 13.3 11.5 - 15.5 %   Platelets 211 150 - 400 K/uL   nRBC 0.2 0.0 - 0.2 %    Comment: Performed at [facility], [place] [address], [place], [place] [address]  Troponin I - ONCE - STAT     Status: None   Collection Time: 05/05/18  3:43 AM  Result Value Ref Range   Troponin I <0.03 <0.03 ng/mL    Comment: Performed at [facility] [address], [place], [place] [address]  Brain natriuretic peptide     Status: Abnormal   Collection Time: 05/05/18  4:42 AM  Result Value Ref Range   B Natriuretic Peptide 490.9 (H) 0.0 - 100.0 pg/mL    Comment: Performed at [facility] [address], [place], [place] [address]  I-stat troponin, ED     Status: None   Collection Time: 05/05/18  5:00 AM  Result Value Ref Range   Troponin i, poc 0.00 0.00 - 0.08 ng/mL   Comment 3            Comment: Due to the release kinetics of cTnI, a negative result within the first hours of the onset of symptoms does not rule out myocardial infarction with certainty. If myocardial infarction is still suspected, repeat the test at appropriate intervals.   I-stat Creatinine, ED     Status: Abnormal   Collection Time: 05/05/18  5:02 AM  Result Value Ref Range   Creatinine, Ser 2.10 (H) 0.61 - 1.24 mg/dL  POCT I-Stat EG7     Status: Abnormal   Collection Time: 05/05/18  5:03 AM  Result Value Ref Range   pH, Ven 7.310 7.250 - 7.430   pCO2, Ven 45.3 44.0 - 60.0 mmHg   pO2, Ven 62.0 (H) 32.0 - 45.0 mmHg   Bicarbonate 22.8 20.0 - 28.0 mmol/L   TCO2 24 22 - 32 mmol/L   O2 Saturation 89.0 %   Acid-base deficit 3.0 (H) 0.0 - 2.0 mmol/L   Sodium 143 135 - 145 mmol/L   Potassium 3.9  3.5 -  5.1 mmol/L   Calcium, Ion 1.15 1.15 - 1.40 mmol/L   HCT 33.0 (L) 39.0 - 52.0 %   Hemoglobin 11.2 (L) 13.0 - 17.0 g/dL   Patient temperature HIDE    Sample type VENOUS   Lactic acid, plasma     Status: Abnormal   Collection Time: 05/05/18  8:07 AM  Result Value Ref Range   Lactic Acid, Venous 2.3 (HH) 0.5 - 1.9 mmol/L    Comment: CRITICAL RESULT CALLED TO, READ BACK BY AND VERIFIED WITHMaureen Ralphs RN @ [phone] 05/05/18 LEONARD,A Performed at [facility], [place] [address], [place], [place] [address]   Procalcitonin     Status: None   Collection Time: 05/05/18  8:34 AM  Result Value Ref Range   Procalcitonin <0.10 ng/mL    Comment:        Interpretation: PCT (Procalcitonin) <= 0.5 ng/mL: Systemic infection (sepsis) is not likely. Local bacterial infection is possible. (NOTE)       Sepsis PCT Algorithm           Lower Respiratory Tract                                      Infection PCT Algorithm    ----------------------------     ----------------------------         PCT < 0.25 ng/mL                PCT < 0.10 ng/mL         Strongly encourage             Strongly discourage   discontinuation of antibiotics    initiation of antibiotics    ----------------------------     -----------------------------       PCT 0.25 - 0.50 ng/mL            PCT 0.10 - 0.25 ng/mL               OR       >80% decrease in PCT            Discourage initiation of                                            antibiotics      Encourage discontinuation           of antibiotics    ----------------------------     -----------------------------         PCT >= 0.50 ng/mL              PCT 0.26 - 0.50 ng/mL               AND        <80% decrease in PCT             Encourage initiation of                                             antibiotics       Encourage continuation           of antibiotics    ----------------------------     -----------------------------  PCT >= 0.50 ng/mL                  PCT  > 0.50 ng/mL               AND         increase in PCT                  Strongly encourage                                      initiation of antibiotics    Strongly encourage escalation           of antibiotics                                     -----------------------------                                           PCT <= 0.25 ng/mL                                                 OR                                        > 80% decrease in PCT                                     Discontinue / Do not initiate                                             antibiotics Performed at [facility], [address], [place], [place] [address]   Valproic acid level     Status: Abnormal   Collection Time: 05/05/18  8:34 AM  Result Value Ref Range   Valproic Acid Lvl 18 (L) 50.0 - 100.0 ug/mL    Comment: Performed at [facility] [address], [place], [place] [address]  Lactic acid, plasma     Status: Abnormal   Collection Time: 05/05/18 10:58 AM  Result Value Ref Range   Lactic Acid, Venous 4.0 (HH) 0.5 - 1.9 mmol/L    Comment: CRITICAL RESULT CALLED TO, READ BACK BY AND VERIFIED WITH: MCCORD,S RN @ [phone] 05/05/18 LEONARD,A Performed at [facility], [place] [address] [place], [place] [address]    Ct Chest Wo Contrast  Result Date: 05/05/2018 CLINICAL DATA:  Shortness of breath.  Abnormal chest x-ray EXAM: CT CHEST WITHOUT CONTRAST TECHNIQUE: Multidetector CT imaging of the chest was performed following the standard protocol without IV contrast. COMPARISON:  Chest x-ray from earlier today.  01/20/2017 chest CT FINDINGS: Cardiovascular: No cardiomegaly. Small pericardial effusion that is slightly increased. No acute vascular finding. Coronary atherosclerosis. Mediastinum/Nodes: Mild nodal prominence considered reactive in this  setting. Lungs/Pleura: There is generalized airway thickening. Mild patchy nodular and airspace opacities in the right middle and lower lobes. Patchy  ground-glass density in the bilateral apex. There is dependent atelectasis and septal thickening with trace pleural effusions. Mild emphysema Upper Abdomen: Cholecystectomy which accounts for common bile duct prominence where visualized. Bilateral partially covered renal cystic densities. Musculoskeletal: No acute finding. IMPRESSION: 1. Multifocal pneumonia. 2. Mild interstitial edema with trace pleural effusions and pericardial effusion. 3. Lower lobe atelectasis. 4. Mild emphysema. Electronically Signed   By: Monte Fantasia M.D.   On: 05/05/2018 05:46   Dg Chest Portable 1 View  Result Date: 05/05/2018 CLINICAL DATA:  Shortness of breath EXAM: PORTABLE CHEST 1 VIEW COMPARISON:  05/31/2017 FINDINGS: Mild cardiac enlargement. New perihilar infiltration could represent edema or multifocal pneumonia. No blunting of costophrenic angles. No pneumothorax. Mediastinal contours appear intact. IMPRESSION: New perihilar infiltration suggesting edema or multifocal pneumonia. Cardiac enlargement. Electronically Signed   By: Lucienne Capers M.D.   On: 05/05/2018 04:02   Vas [name] Le Art Seg Multi (segm&le Reynauds)  Result Date: 05/04/2018 LOWER EXTREMITY DOPPLER STUDY Indications: Problematic painful, thickened and elongated toenails. High Risk Factors: Hypertension, past history of smoking. Other Factors: Barely palpable tibial pulses, bilaterally, as well as decreased                capillary refill time to all toes. Skin on the calves and feet is                shiny and thin.  Comparison Study: None Performing Technologist: [employer] BS, RVT, RDCS  Examination Guidelines: A complete evaluation includes at minimum, Doppler waveform signals and systolic blood pressure reading at the level of bilateral brachial, anterior tibial, and posterior tibial arteries, when vessel segments are accessible. Bilateral testing is considered an integral part of a complete examination. Photoelectric Plethysmograph (PPG)  waveforms and toe systolic pressure readings are included as required and additional duplex testing as needed. Limited examinations for reoccurring indications may be performed as noted.  ABI Findings: +---------+------------------+-----+---------+--------+ Right    Rt Pressure (mmHg)IndexWaveform Comment  +---------+------------------+-----+---------+--------+ Brachial 118                                      +---------+------------------+-----+---------+--------+ CFA                             triphasic         +---------+------------------+-----+---------+--------+ Popliteal                       triphasic         +---------+------------------+-----+---------+--------+ ATA      139               1.18 triphasic         +---------+------------------+-----+---------+--------+ PTA      144               1.22 triphasic         +---------+------------------+-----+---------+--------+ PERO     144               1.22 triphasic         +---------+------------------+-----+---------+--------+ Great Toe143               1.21 Normal            +---------+------------------+-----+---------+--------+ +---------+------------------+-----+---------+-------+  Left     Lt Pressure (mmHg)IndexWaveform Comment +---------+------------------+-----+---------+-------+ Brachial 113                                     +---------+------------------+-----+---------+-------+ CFA                             triphasic        +---------+------------------+-----+---------+-------+ Popliteal                       triphasic        +---------+------------------+-----+---------+-------+ ATA      142               1.20 triphasic        +---------+------------------+-----+---------+-------+ PTA      136               1.15 triphasic        +---------+------------------+-----+---------+-------+ PERO     137               1.16 triphasic         +---------+------------------+-----+---------+-------+ Great Toe144               1.22 Normal           +---------+------------------+-----+---------+-------+ +-------+-----------+-----------+------------+------------+ ABI/TBIToday's ABIToday's TBIPrevious ABIPrevious TBI +-------+-----------+-----------+------------+------------+ Right  1.22       1.21                                +-------+-----------+-----------+------------+------------+ Left   1.2        1.22                                +-------+-----------+-----------+------------+------------+ TOES Findings: +----------+---------------+--------+-------+ Right ToesPressure (mmHg)WaveformComment +----------+---------------+--------+-------+ 1st Digit 143            Normal          +----------+---------------+--------+-------+ 2nd Digit                Normal          +----------+---------------+--------+-------+ 3rd Digit                Normal          +----------+---------------+--------+-------+ 4th Digit                Normal          +----------+---------------+--------+-------+ 5th Digit                Normal          +----------+---------------+--------+-------+ +---------+---------------+--------+-------+ Left ToesPressure (mmHg)WaveformComment +---------+---------------+--------+-------+ 1st [id number]            Normal          +---------+---------------+--------+-------+ 2nd Digit               Normal          +---------+---------------+--------+-------+ 3rd Digit               Normal          +---------+---------------+--------+-------+ 4th Digit               Normal          +---------+---------------+--------+-------+ 5th Digit  Normal          +---------+---------------+--------+-------+   Summary: Right: Resting right ankle-brachial index is within normal range. No evidence of significant right lower extremity arterial disease. The right toe-brachial  index is normal. RT great toe pressure = 143 mmHg. Left: Resting left ankle-brachial index is within normal range. No evidence of significant left lower extremity arterial disease. The left toe-brachial index is normal. LT Great toe pressure = 144 mmHg.  *See table(s) above for measurements and observations.  Electronically signed by Kathlyn Sacramento MD on 05/04/2018 at 5:10:29 PM.    Final     Pending Labs Unresulted Labs (From admission, onward)    Start     Ordered   05/06/18 7510  Basic metabolic panel  Tomorrow morning,   R     05/05/18 0837   05/06/18 0500  CBC  Tomorrow morning,   R     05/05/18 0837   05/05/18 1406  Lactic acid, plasma  STAT Now then every 3 hours,   R     05/05/18 1406   05/05/18 1033  Expectorated sputum assessment w rflx to resp cult  Once,   R     05/05/18 1038   05/05/18 0735  Blood culture (routine x 2)  BLOOD CULTURE X 2,   STAT     05/05/18 0735          Vitals/Pain Today's Vitals   05/05/18 1300 05/05/18 1330 05/05/18 1400 05/05/18 1430  BP: (!) 129/96 133/84 117/75 125/79  Pulse: (!) 117 98 96 93  Resp: 19 15 (!) 30 13  Temp:      TempSrc:      SpO2: 98% 96% 95% 96%  PainSc:        Isolation Precautions No active isolations  Medications Medications  allopurinol (ZYLOPRIM) tablet 200 mg (200 mg Oral Given 05/05/18 1042)  amLODipine (NORVASC) tablet 2.5 mg (2.5 mg Oral Given 05/05/18 1042)  metoprolol succinate (TOPROL-XL) 24 hr tablet 50 mg (50 mg Oral Given 05/05/18 1252)  prazosin (MINIPRESS) capsule 5 mg (has no administration in time range)  OLANZapine (ZYPREXA) tablet 7.5 mg (has no administration in time range)  pantoprazole (PROTONIX) EC tablet 40 mg (40 mg Oral Given 05/05/18 1043)  mirabegron ER (MYRBETRIQ) tablet 25 mg (25 mg Oral Given 05/05/18 1042)  ferrous sulfate tablet 325 mg (has no administration in time range)  divalproex (DEPAKOTE) DR tablet 1,500 mg (has no administration in time range)  enoxaparin (LOVENOX) injection 40 mg  (has no administration in time range)  sodium chloride flush (NS) 0.9 % injection 3 mL (3 mLs Intravenous Not Given 05/05/18 1045)  acetaminophen (TYLENOL) tablet 650 mg (has no administration in time range)    Or  acetaminophen (TYLENOL) suppository 650 mg (has no administration in time range)  docusate sodium (COLACE) capsule 100 mg (100 mg Oral Given 05/05/18 1252)  polyethylene glycol (MIRALAX / GLYCOLAX) packet 17 g (has no administration in time range)  ondansetron (ZOFRAN) tablet 4 mg (has no administration in time range)    Or  ondansetron (ZOFRAN) injection 4 mg (has no administration in time range)  azithromycin (ZITHROMAX) 500 mg in sodium chloride 0.9 % 250 mL IVPB (0 mg Intravenous Stopped 05/05/18 1143)  cefTRIAXone (ROCEPHIN) 2 g in sodium chloride 0.9 % 100 mL IVPB (has no administration in time range)  magnesium oxide (MAG-OX) tablet 800 mg (has no administration in time range)  hydrocortisone sodium succinate (SOLU-CORTEF) 100 MG injection 50 mg (50 mg  Intravenous Given 05/05/18 1252)  0.9 %  sodium chloride infusion ( Intravenous New Bag/Given 05/05/18 1440)  sodium chloride flush (NS) 0.9 % injection 3 mL (3 mLs Intravenous Given 05/05/18 0636)  furosemide (LASIX) injection 40 mg (40 mg Intravenous Given 05/05/18 0634)  ceFEPIme (MAXIPIME) 1 g in sodium chloride 0.9 % 100 mL IVPB (0 g Intravenous Stopped 05/05/18 0825)  vancomycin (VANCOCIN) 1,500 mg in sodium chloride 0.9 % 500 mL IVPB (0 mg Intravenous Stopped 05/05/18 1055)  sodium chloride 0.9 % bolus 500 mL (0 mLs Intravenous Stopped 05/05/18 1439)    Mobility walks Moderate fall risk   Focused Assessments Pulmonary Assessment Handoff:  Lung sounds: Bilateral Breath Sounds: Clear, Diminished L Breath Sounds: Expiratory wheezes R Breath Sounds: Expiratory wheezes O2 Device: Nasal Cannula O2 Flow Rate (L/min): 2 L/min      R Recommendations: See Admitting Provider Note  Report given to:   Additional Notes:

## 2018-05-05 NOTE — ED Triage Notes (Signed)
Pt arrives VIA EMS from home. Woke up 1 hour ago with SOB, felt like he was wheezing. Rhonchi throughout. 10 albuterol 0.5atrovent, 125 solumedrol, 2gm Magnesium. Sats were 92% RA, 40 RR on scene. CPAP placed. #16G in LFA. 169/114, hr122, cbg 169 WOB has improved.

## 2018-05-05 NOTE — Progress Notes (Signed)
  Echocardiogram 2D Echocardiogram has been performed.  Brent [name] 05/05/2018, 2:42 PM

## 2018-05-05 NOTE — ED Provider Notes (Addendum)
[facility] EMERGENCY DEPARTMENT Provider Note   CSN: [id number] Arrival date & time: 05/05/18  7948    History   Chief Complaint Chief Complaint  Patient presents with  . Shortness of Breath    HPI Brent [name] is a 73 y.o. male.     The history is provided by the EMS personnel. The history is limited by the condition of the patient.  Shortness of Breath  Severity:  Severe Onset quality:  Sudden Timing:  Constant Progression:  Improving Chronicity:  New Context: not activity and not URI   Relieved by: nebs by EMS and Bipap. Worsened by:  Nothing Ineffective treatments:  None tried Associated symptoms: wheezing   Associated symptoms: no fever     Past Medical History:  Diagnosis Date  . Abdominal pain, right lower quadrant   . Abdominal pain, unspecified site   . Acute cholecystitis   . Anemia   . Anemia, unspecified   . Anxiety   . Benign paroxysmal positional vertigo   . Bipolar I disorder, most recent episode (or current) unspecified   . Celiac disease   . Cellulitis and abscess of unspecified site   . Chronic kidney disease, stage III (moderate) (HCC)   . Cough   . Crohn's   . Depression   . Diarrhea   . Dizziness and giddiness   . Dizziness and giddiness   . Dizziness and giddiness   . Dysuria   . Edema   . Encounter for long-term (current) use of other medications   . Essential and other specified forms of tremor   . Essential hypertension, benign   . Gout 2018  . Hypertensive renal disease, benign   . Hypertrophy of prostate without urinary obstruction and other lower urinary tract symptoms (LUTS)   . Hypopotassemia   . Impacted cerumen   . Impotence of organic origin   . Insomnia with sleep apnea, unspecified   . Insomnia with sleep apnea, unspecified   . Iron deficiency anemia, unspecified   . Loss of weight   . Malnutrition of mild degree ([place])   . 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 Nausea with vomiting   . Neuralgia, neuritis,  and radiculitis, unspecified   . Other abnormal blood chemistry   . Other B-complex deficiencies   . Other B-complex deficiencies   . Other extrapyramidal disease and abnormal movement disorder   . Other extrapyramidal disease and abnormal movement disorder   . Other malaise and fatigue   . Other specified disease of white blood cells   . Other specified disease of white blood cells   . Pain in joint, lower leg   . Pain in limb   . Postinflammatory pulmonary fibrosis ([place])   . Regional enteritis of small intestine ([place])   . Regional enteritis of small intestine with large intestine ([place])   . Renal insufficiency   . Tobacco use disorder   . Unspecified disorder of skin and subcutaneous tissue   . Vertigo 2018  . Viral warts, unspecified   . Viremia, unspecified     Patient Active Problem List   Diagnosis Date Noted  . Candida esophagitis ([place]) 09/22/2017  . History of smoking 30 or more pack years 01/05/2017  . Bipolar 1 disorder ([place])   . BPH (benign prostatic hyperplasia) 03/31/2013  . 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 Essential hypertension, benign 03/31/2013  . Chronic kidney disease (CKD), stage III (moderate) ([place]) 06/04/2012  . 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 CHOLECYSTECTOMY  07-12-2010  . SMALL INTESTINE SURGERY     x 2        Home Medications    Prior to Admission medications   Medication Sig Start Date End Date Taking? Authorizing Provider  allopurinol (ZYLOPRIM) 100 MG tablet Take 2 tablets (200 mg total) by mouth daily. 04/10/16   Lauree Chandler, NP  amLODipine (NORVASC) 2.5 MG tablet Take 2.5 mg by mouth daily.    [provider]  Calcium Carbonate-Vit D-Min 600-400 MG-UNIT TABS Take 1 tablet by mouth 3 (three) times daily.     [provider]  cholecalciferol (VITAMIN D) 1000 units tablet Take 1,000 Units by mouth daily.     [provider]    divalproex (DEPAKOTE) 500 MG EC tablet Take 1,500 mg by mouth at bedtime.     [provider]  epoetin alfa (PROCRIT) 38250 UNIT/ML injection Inject 10,000 Units into the skin every 30 (thirty) days.     [provider]  ferrous sulfate 325 (65 FE) MG tablet Take 325 mg by mouth 2 (two) times daily.      [provider]  inFLIXimab (REMICADE) 100 MG injection Infuse Remicade IV schedule 1 70m/kg every 8 weeks Premedicate with Tylenol 500-6566mby mouth and Benadryl 25-5035my mouth prior to infusion. Last PPD was on 12/2009.  11/22/10   JacMilus BanisterD  Loperamide HCl (LOPERAMIDE A-D PO) Take 1 capsule by mouth as needed (loose stools).     [provider]  magnesium oxide (MAG-OX) 400 (241.3 Mg) MG tablet Take 2,400 mg by mouth 3 (three) times daily.  04/17/17   [provider]  meclizine (ANTIVERT) 25 MG tablet Take 25 mg by mouth 3 (three) times daily as needed for dizziness.  06/21/16   [provider]  metoprolol succinate (TOPROL-XL) 50 MG 24 hr tablet Take 50 mg by mouth daily. Take with or immediately following a meal.    [provider]  mirabegron ER (MYRBETRIQ) 25 MG TB24 tablet Take 1 tablet (25 mg total) by mouth daily. 02/10/18   EubLauree ChandlerP  Multiple Vitamin (MULTIVITAMIN) tablet Take 1 tablet by mouth daily.      [provider]  nystatin (MYCOSTATIN) 100000 UNIT/ML suspension Use as directed 5 mLs (500,000 Units total) in the mouth or throat 4 (four) times daily. 02/02/18   EubLauree ChandlerP  OLANZapine (ZYPREXA) 7.5 MG tablet Take 1 tablet (7.5 mg total) by mouth at bedtime. 01/03/16   Reed, Tiffany L, DO  omeprazole (PRILOSEC) 20 MG capsule Take 20 mg by mouth daily before supper.      [provider]  prazosin (MINIPRESS) 5 MG capsule Take 5 mg by mouth at bedtime.    [provider]  vitamin E 400 UNIT capsule Take 400 Units by mouth daily.      [provider]     Family History Family History  Problem Relation Age of Onset  . Diabetes Mother        maternal grandmother  . Uterine cancer Mother   . Emphysema Father   . Pneumonia Maternal Grandmother   . Colon cancer Neg Hx     Social History Social History   Tobacco Use  . Smoking status: Former Smoker    Packs/day: 1.00    Years: 49.00    Pack years: 49.00    Types: Cigarettes    Start date: 04/11/1956  Last attempt to quit: 04/17/2014    Years since quitting: 4.0  . Smokeless tobacco: Never Used  Substance Use Topics  . Alcohol use: No    Alcohol/week: 0.0 standard drinks  . Drug use: No     Allergies   Azathioprine; Ciprofloxacin; Levaquin [levofloxacin in d5w]; and Plendil [felodipine]   Review of Systems Review of Systems  Unable to perform ROS: Acuity of condition  Constitutional: Negative for fever.  Respiratory: Positive for shortness of breath and wheezing.   Cardiovascular: Negative for palpitations and leg swelling.     Physical Exam Updated Vital Signs BP 131/84   Pulse (!) 101   Temp 97.7 F (36.5 C) (Oral)   Resp 15   SpO2 100%   Physical Exam Vitals signs and nursing note reviewed.  Constitutional:      Appearance: He is normal weight.  HENT:     Head: Normocephalic and atraumatic.     Nose: Nose normal.  Eyes:     Extraocular Movements: Extraocular movements intact.     Conjunctiva/sclera: Conjunctivae normal.  Neck:     Musculoskeletal: Normal range of motion and neck supple.  Cardiovascular:     Rate and Rhythm: Normal rate and regular rhythm.     Pulses: Normal pulses.     Heart sounds: Normal heart sounds.  Pulmonary:     Breath sounds: Wheezing and rales present. No rhonchi.  Abdominal:     General: Abdomen is flat. Bowel sounds are normal.     Tenderness: There is no abdominal tenderness. There is no guarding or rebound.  Musculoskeletal: Normal range of motion.  Skin:    General: Skin is warm and dry.     Capillary  Refill: Capillary refill takes less than 2 seconds.  Neurological:     General: No focal deficit present.     Mental Status: He is alert.     Deep Tendon Reflexes: Reflexes normal.  Psychiatric:        Mood and Affect: Mood normal.      ED Treatments / Results  Labs (all labs ordered are listed, but only abnormal results are displayed) Results for orders placed or performed during the hospital encounter of 76/28/31  Basic metabolic panel  Result Value Ref Range   Sodium 142 135 - 145 mmol/L   Potassium 3.6 3.5 - 5.1 mmol/L   Chloride 109 98 - 111 mmol/L   CO2 25 22 - 32 mmol/L   Glucose, Bld 158 (H) 70 - 99 mg/dL   BUN 17 8 - 23 mg/dL   Creatinine, Ser 2.12 (H) 0.61 - 1.24 mg/dL   Calcium 8.5 (L) 8.9 - 10.3 mg/dL   GFR calc non Af Amer 30 (L) >60 mL/min   GFR calc Af Amer 35 (L) >60 mL/min   Anion gap 8 5 - 15  CBC  Result Value Ref Range   WBC 12.9 (H) 4.0 - 10.5 K/uL   RBC 3.24 (L) 4.22 - 5.81 MIL/uL   Hemoglobin 10.6 (L) 13.0 - 17.0 g/dL   HCT 35.4 (L) 39.0 - 52.0 %   MCV 109.3 (H) 80.0 - 100.0 fL   MCH 32.7 26.0 - 34.0 pg   MCHC 29.9 (L) 30.0 - 36.0 g/dL   RDW 13.3 11.5 - 15.5 %   Platelets 211 150 - 400 K/uL   nRBC 0.2 0.0 - 0.2 %  Troponin I - ONCE - STAT  Result Value Ref Range   Troponin I <0.03 <0.03 ng/mL  Brain  natriuretic peptide  Result Value Ref Range   B Natriuretic Peptide 490.9 (H) 0.0 - 100.0 pg/mL  I-stat troponin, ED  Result Value Ref Range   Troponin i, poc 0.00 0.00 - 0.08 ng/mL   Comment 3          I-stat Creatinine, ED  Result Value Ref Range   Creatinine, Ser 2.10 (H) 0.61 - 1.24 mg/dL  POCT I-Stat EG7  Result Value Ref Range   pH, Ven 7.310 7.250 - 7.430   pCO2, Ven 45.3 44.0 - 60.0 mmHg   pO2, Ven 62.0 (H) 32.0 - 45.0 mmHg   Bicarbonate 22.8 20.0 - 28.0 mmol/L   TCO2 24 22 - 32 mmol/L   O2 Saturation 89.0 %   Acid-base deficit 3.0 (H) 0.0 - 2.0 mmol/L   Sodium 143 135 - 145 mmol/L   Potassium 3.9 3.5 - 5.1 mmol/L   Calcium,  Ion 1.15 1.15 - 1.40 mmol/L   HCT 33.0 (L) 39.0 - 52.0 %   Hemoglobin 11.2 (L) 13.0 - 17.0 g/dL   Patient temperature HIDE    Sample type VENOUS    Ct Chest Wo Contrast  Result Date: 05/05/2018 CLINICAL DATA:  Shortness of breath.  Abnormal chest x-ray EXAM: CT CHEST WITHOUT CONTRAST TECHNIQUE: Multidetector CT imaging of the chest was performed following the standard protocol without IV contrast. COMPARISON:  Chest x-ray from earlier today.  01/20/2017 chest CT FINDINGS: Cardiovascular: No cardiomegaly. Small pericardial effusion that is slightly increased. No acute vascular finding. Coronary atherosclerosis. Mediastinum/Nodes: Mild nodal prominence considered reactive in this setting. Lungs/Pleura: There is generalized airway thickening. Mild patchy nodular and airspace opacities in the right middle and lower lobes. Patchy ground-glass density in the bilateral apex. There is dependent atelectasis and septal thickening with trace pleural effusions. Mild emphysema Upper Abdomen: Cholecystectomy which accounts for common bile duct prominence where visualized. Bilateral partially covered renal cystic densities. Musculoskeletal: No acute finding. IMPRESSION: 1. Multifocal pneumonia. 2. Mild interstitial edema with trace pleural effusions and pericardial effusion. 3. Lower lobe atelectasis. 4. Mild emphysema. Electronically Signed   By: Monte Fantasia M.D.   On: 05/05/2018 05:46   Dg Chest Portable 1 View  Result Date: 05/05/2018 CLINICAL DATA:  Shortness of breath EXAM: PORTABLE CHEST 1 VIEW COMPARISON:  05/31/2017 FINDINGS: Mild cardiac enlargement. New perihilar infiltration could represent edema or multifocal pneumonia. No blunting of costophrenic angles. No pneumothorax. Mediastinal contours appear intact. IMPRESSION: New perihilar infiltration suggesting edema or multifocal pneumonia. Cardiac enlargement. Electronically Signed   By: Lucienne Capers M.D.   On: 05/05/2018 04:02   Vas [name] Le Art Seg  Multi (segm&le Reynauds)  Result Date: 05/04/2018 LOWER EXTREMITY DOPPLER STUDY Indications: Problematic painful, thickened and elongated toenails. High Risk Factors: Hypertension, past history of smoking. Other Factors: Barely palpable tibial pulses, bilaterally, as well as decreased                capillary refill time to all toes. Skin on the calves and feet is                shiny and thin.  Comparison Study: None Performing Technologist: [employer] BS, RVT, RDCS  Examination Guidelines: A complete evaluation includes at minimum, Doppler waveform signals and systolic blood pressure reading at the level of bilateral brachial, anterior tibial, and posterior tibial arteries, when vessel segments are accessible. Bilateral testing is considered an integral part of a complete examination. Photoelectric Plethysmograph (PPG) waveforms and toe systolic pressure readings are included as  required and additional duplex testing as needed. Limited examinations for reoccurring indications may be performed as noted.  ABI Findings: +---------+------------------+-----+---------+--------+ Right    Rt Pressure (mmHg)IndexWaveform Comment  +---------+------------------+-----+---------+--------+ Brachial 118                                      +---------+------------------+-----+---------+--------+ CFA                             triphasic         +---------+------------------+-----+---------+--------+ Popliteal                       triphasic         +---------+------------------+-----+---------+--------+ ATA      139               1.18 triphasic         +---------+------------------+-----+---------+--------+ PTA      144               1.22 triphasic         +---------+------------------+-----+---------+--------+ PERO     144               1.22 triphasic         +---------+------------------+-----+---------+--------+ Great Toe143               1.21 Normal             +---------+------------------+-----+---------+--------+ +---------+------------------+-----+---------+-------+ Left     Lt Pressure (mmHg)IndexWaveform Comment +---------+------------------+-----+---------+-------+ Brachial 113                                     +---------+------------------+-----+---------+-------+ CFA                             triphasic        +---------+------------------+-----+---------+-------+ Popliteal                       triphasic        +---------+------------------+-----+---------+-------+ ATA      142               1.20 triphasic        +---------+------------------+-----+---------+-------+ PTA      136               1.15 triphasic        +---------+------------------+-----+---------+-------+ PERO     137               1.16 triphasic        +---------+------------------+-----+---------+-------+ Great Toe144               1.22 Normal           +---------+------------------+-----+---------+-------+ +-------+-----------+-----------+------------+------------+ ABI/TBIToday's ABIToday's TBIPrevious ABIPrevious TBI +-------+-----------+-----------+------------+------------+ Right  1.22       1.21                                +-------+-----------+-----------+------------+------------+ Left   1.2        1.22                                +-------+-----------+-----------+------------+------------+  TOES Findings: +----------+---------------+--------+-------+ Right ToesPressure (mmHg)WaveformComment +----------+---------------+--------+-------+ 1st Digit 143            Normal          +----------+---------------+--------+-------+ 2nd Digit                Normal          +----------+---------------+--------+-------+ 3rd Digit                Normal          +----------+---------------+--------+-------+ 4th Digit                Normal          +----------+---------------+--------+-------+ 5th Digit                 Normal          +----------+---------------+--------+-------+ +---------+---------------+--------+-------+ Left ToesPressure (mmHg)WaveformComment +---------+---------------+--------+-------+ 1st [id number]            Normal          +---------+---------------+--------+-------+ 2nd Digit               Normal          +---------+---------------+--------+-------+ 3rd Digit               Normal          +---------+---------------+--------+-------+ 4th Digit               Normal          +---------+---------------+--------+-------+ 5th Digit               Normal          +---------+---------------+--------+-------+   Summary: Right: Resting right ankle-brachial index is within normal range. No evidence of significant right lower extremity arterial disease. The right toe-brachial index is normal. RT great toe pressure = 143 mmHg. Left: Resting left ankle-brachial index is within normal range. No evidence of significant left lower extremity arterial disease. The left toe-brachial index is normal. LT Great toe pressure = 144 mmHg.  *See table(s) above for measurements and observations.  Electronically signed by Kathlyn Sacramento MD on 05/04/2018 at 5:10:29 PM.    Final     EKG EKG Interpretation  Date/Time:  Wednesday May 05 2018 04:58:32 EDT Ventricular Rate:  99 PR Interval:    QRS Duration: 91 QT Interval:  355 QTC Calculation: 456 R Axis:   39 Text Interpretation:  Sinus rhythm LVH with secondary repolarization abnormality Confirmed by Randal Buba, Haillee Johann ([id number]) on 05/05/2018 5:00:34 AM   Radiology Ct Chest Wo Contrast  Result Date: 05/05/2018 CLINICAL DATA:  Shortness of breath.  Abnormal chest x-ray EXAM: CT CHEST WITHOUT CONTRAST TECHNIQUE: Multidetector CT imaging of the chest was performed following the standard protocol without IV contrast. COMPARISON:  Chest x-ray from earlier today.  01/20/2017 chest CT FINDINGS: Cardiovascular: No cardiomegaly. Small  pericardial effusion that is slightly increased. No acute vascular finding. Coronary atherosclerosis. Mediastinum/Nodes: Mild nodal prominence considered reactive in this setting. Lungs/Pleura: There is generalized airway thickening. Mild patchy nodular and airspace opacities in the right middle and lower lobes. Patchy ground-glass density in the bilateral apex. There is dependent atelectasis and septal thickening with trace pleural effusions. Mild emphysema Upper Abdomen: Cholecystectomy which accounts for common bile duct prominence where visualized. Bilateral partially covered renal cystic densities. Musculoskeletal: No acute finding. IMPRESSION: 1. Multifocal pneumonia. 2. Mild interstitial edema with trace pleural effusions and pericardial effusion. 3. Lower lobe atelectasis. 4. Mild emphysema. Electronically Signed   By: Angelica Chessman  Watts M.D.   On: 05/05/2018 05:46   Dg Chest Portable 1 View  Result Date: 05/05/2018 CLINICAL DATA:  Shortness of breath EXAM: PORTABLE CHEST 1 VIEW COMPARISON:  05/31/2017 FINDINGS: Mild cardiac enlargement. New perihilar infiltration could represent edema or multifocal pneumonia. No blunting of costophrenic angles. No pneumothorax. Mediastinal contours appear intact. IMPRESSION: New perihilar infiltration suggesting edema or multifocal pneumonia. Cardiac enlargement. Electronically Signed   By: Lucienne Capers M.D.   On: 05/05/2018 04:02   Vas [name] Le Art Seg Multi (segm&le Reynauds)  Result Date: 05/04/2018 LOWER EXTREMITY DOPPLER STUDY Indications: Problematic painful, thickened and elongated toenails. High Risk Factors: Hypertension, past history of smoking. Other Factors: Barely palpable tibial pulses, bilaterally, as well as decreased                capillary refill time to all toes. Skin on the calves and feet is                shiny and thin.  Comparison Study: None Performing Technologist: [employer] BS, RVT, RDCS  Examination Guidelines: A complete evaluation  includes at minimum, Doppler waveform signals and systolic blood pressure reading at the level of bilateral brachial, anterior tibial, and posterior tibial arteries, when vessel segments are accessible. Bilateral testing is considered an integral part of a complete examination. Photoelectric Plethysmograph (PPG) waveforms and toe systolic pressure readings are included as required and additional duplex testing as needed. Limited examinations for reoccurring indications may be performed as noted.  ABI Findings: +---------+------------------+-----+---------+--------+ Right    Rt Pressure (mmHg)IndexWaveform Comment  +---------+------------------+-----+---------+--------+ Brachial 118                                      +---------+------------------+-----+---------+--------+ CFA                             triphasic         +---------+------------------+-----+---------+--------+ Popliteal                       triphasic         +---------+------------------+-----+---------+--------+ ATA      139               1.18 triphasic         +---------+------------------+-----+---------+--------+ PTA      144               1.22 triphasic         +---------+------------------+-----+---------+--------+ PERO     144               1.22 triphasic         +---------+------------------+-----+---------+--------+ Great Toe143               1.21 Normal            +---------+------------------+-----+---------+--------+ +---------+------------------+-----+---------+-------+ Left     Lt Pressure (mmHg)IndexWaveform Comment +---------+------------------+-----+---------+-------+ Brachial 113                                     +---------+------------------+-----+---------+-------+ CFA                             triphasic        +---------+------------------+-----+---------+-------+ Popliteal  triphasic         +---------+------------------+-----+---------+-------+ ATA      142               1.20 triphasic        +---------+------------------+-----+---------+-------+ PTA      136               1.15 triphasic        +---------+------------------+-----+---------+-------+ PERO     137               1.16 triphasic        +---------+------------------+-----+---------+-------+ Great Toe144               1.22 Normal           +---------+------------------+-----+---------+-------+ +-------+-----------+-----------+------------+------------+ ABI/TBIToday's ABIToday's TBIPrevious ABIPrevious TBI +-------+-----------+-----------+------------+------------+ Right  1.22       1.21                                +-------+-----------+-----------+------------+------------+ Left   1.2        1.22                                +-------+-----------+-----------+------------+------------+ TOES Findings: +----------+---------------+--------+-------+ Right ToesPressure (mmHg)WaveformComment +----------+---------------+--------+-------+ 1st Digit 143            Normal          +----------+---------------+--------+-------+ 2nd Digit                Normal          +----------+---------------+--------+-------+ 3rd Digit                Normal          +----------+---------------+--------+-------+ 4th Digit                Normal          +----------+---------------+--------+-------+ 5th Digit                Normal          +----------+---------------+--------+-------+ +---------+---------------+--------+-------+ Left ToesPressure (mmHg)WaveformComment +---------+---------------+--------+-------+ 1st [id number]            Normal          +---------+---------------+--------+-------+ 2nd Digit               Normal          +---------+---------------+--------+-------+ 3rd Digit               Normal          +---------+---------------+--------+-------+ 4th Digit                Normal          +---------+---------------+--------+-------+ 5th Digit               Normal          +---------+---------------+--------+-------+   Summary: Right: Resting right ankle-brachial index is within normal range. No evidence of significant right lower extremity arterial disease. The right toe-brachial index is normal. RT great toe pressure = 143 mmHg. Left: Resting left ankle-brachial index is within normal range. No evidence of significant left lower extremity arterial disease. The left toe-brachial index is normal. LT Great toe pressure = 144 mmHg.  *See table(s) above for measurements and observations.  Electronically signed by Kathlyn Sacramento MD on 05/04/2018 at 5:10:29 PM.    Final     Procedures  Procedures (including critical care time)  Medications Ordered in ED Medications  ceFEPIme (MAXIPIME) 1 g in sodium chloride 0.9 % 100 mL IVPB (has no administration in time range)  vancomycin (VANCOCIN) 1,500 mg in sodium chloride 0.9 % 500 mL IVPB (has no administration in time range)  sodium chloride flush (NS) 0.9 % injection 3 mL (3 mLs Intravenous Given 05/05/18 0636)  furosemide (LASIX) injection 40 mg (40 mg Intravenous Given 05/05/18 0601)    MDM Reviewed: nursing note and vitals Interpretation: labs, ECG and x-ray (elevated BNP edema by me on cxr ) Total time providing critical care: 30-74 minutes (bipap). This excludes time spent performing separately reportable procedures and services. Consults: admitting MD   CRITICAL CARE Performed by: Kody Brandl K Rose Hegner-Rasch Total critical care time: 60 minutes Critical care time was exclusive of separately billable procedures and treating other patients. Critical care was necessary to treat or prevent imminent or life-threatening deterioration. Critical care was time spent personally by me on the following activities: development of treatment plan with patient and/or surrogate as well as nursing, discussions with  consultants, evaluation of patient's response to treatment, examination of patient, obtaining history from patient or surrogate, ordering and performing treatments and interventions, ordering and review of laboratory studies, ordering and review of radiographic studies, pulse oximetry and re-evaluation of patient's condition.  Patient's history is not consistent with sepsis. Symptoms started one hour prior to arrival.  He had no prodrome consistent with sepsis.    Final Clinical Impressions(s) / ED Diagnoses   Final diagnoses:  Acute pulmonary edema ([place])  Multifocal pneumonia    Will admit to medicine for pulmonary edema and PNA, I do not believe this is sepsis.  Vitals improved with bipap   Charrise Lardner, MD 05/05/18 [place], Jaquaveon Bilal, MD 05/05/18 5615

## 2018-05-05 NOTE — ED Notes (Signed)
Lunch tray ordered 

## 2018-05-05 NOTE — ED Notes (Signed)
Pt Urine urine output at 0859 am 872m.

## 2018-05-06 DIAGNOSIS — D631 Anemia in chronic kidney disease: Secondary | ICD-10-CM

## 2018-05-06 DIAGNOSIS — J189 Pneumonia, unspecified organism: Secondary | ICD-10-CM

## 2018-05-06 DIAGNOSIS — I5041 Acute combined systolic (congestive) and diastolic (congestive) heart failure: Secondary | ICD-10-CM

## 2018-05-06 DIAGNOSIS — N183 Chronic kidney disease, stage 3 (moderate): Secondary | ICD-10-CM

## 2018-05-06 DIAGNOSIS — F319 Bipolar disorder, unspecified: Secondary | ICD-10-CM

## 2018-05-06 DIAGNOSIS — I10 Essential (primary) hypertension: Secondary | ICD-10-CM

## 2018-05-06 DIAGNOSIS — K50011 Crohn's disease of small intestine with rectal bleeding: Secondary | ICD-10-CM

## 2018-05-06 LAB — BASIC METABOLIC PANEL
Anion gap: 8 (ref 5–15)
BUN: 20 mg/dL (ref 8–23)
CO2: 21 mmol/L — ABNORMAL LOW (ref 22–32)
CREATININE: 1.88 mg/dL — AB (ref 0.61–1.24)
Calcium: 8.2 mg/dL — ABNORMAL LOW (ref 8.9–10.3)
Chloride: 110 mmol/L (ref 98–111)
GFR calc Af Amer: 40 mL/min — ABNORMAL LOW (ref >60)
GFR calc non Af Amer: 35 mL/min — ABNORMAL LOW (ref >60)
Glucose, Bld: 119 mg/dL — ABNORMAL HIGH (ref 70–99)
Potassium: 4.2 mmol/L (ref 3.5–5.1)
Sodium: 139 mmol/L (ref 135–145)

## 2018-05-06 LAB — CBC
HCT: 28.8 % — ABNORMAL LOW (ref 39.0–52.0)
Hemoglobin: 9 g/dL — ABNORMAL LOW (ref 13.0–17.0)
MCH: 32.4 pg (ref 26.0–34.0)
MCHC: 31.3 g/dL (ref 30.0–36.0)
MCV: 103.6 fL — ABNORMAL HIGH (ref 80.0–100.0)
Platelets: 174 10*3/uL (ref 150–400)
RBC: 2.78 MIL/uL — ABNORMAL LOW (ref 4.22–5.81)
RDW: 13.2 % (ref 11.5–15.5)
WBC: 12.2 10*3/uL — ABNORMAL HIGH (ref 4.0–10.5)
nRBC: 0 % (ref 0.0–0.2)

## 2018-05-06 LAB — LACTIC ACID, PLASMA: Lactic Acid, Venous: 2.5 mmol/L (ref 0.5–1.9)

## 2018-05-06 MED ORDER — FUROSEMIDE 10 MG/ML IJ SOLN
40.0000 mg | Freq: Once | INTRAMUSCULAR | Status: AC
  Administered 2018-05-06: 40 mg via INTRAVENOUS
  Filled 2018-05-06: qty 4

## 2018-05-06 MED ORDER — HYDROCORTISONE NA SUCCINATE PF 100 MG IJ SOLR
50.0000 mg | Freq: Three times a day (TID) | INTRAMUSCULAR | Status: DC
  Administered 2018-05-07 (×2): 50 mg via INTRAVENOUS
  Filled 2018-05-06 (×2): qty 2

## 2018-05-06 MED ORDER — CARBAMIDE PEROXIDE 6.5 % OT SOLN
5.0000 [drp] | Freq: Two times a day (BID) | OTIC | Status: DC
  Administered 2018-05-06 – 2018-05-10 (×7): 5 [drp] via OTIC
  Filled 2018-05-06: qty 15

## 2018-05-06 NOTE — Progress Notes (Signed)
PROGRESS NOTE    KHYREN [name]  MPN:[id number] DOB: 1945/07/01 DOA: 05/05/2018 PCP: Brent Curry, DO   Brief Narrative:  HPI per Dr. Karmen Bongo on 05/05/2018 Brent [name] is a 73 y.o. male with medical history significant of Crohn's; celiac disease; HTN; COPD with remote tobacco dependence; and bipolar d/o presenting with SOB.  About 0200 this AM, he woke up and was wet with sweat and couldn't breathe.  He was wheezing and extremely SOB.  He was totally fine last night when he went to bed.  He has not been coughing.  No URI symptoms, fever, etc prior to going to bed last night.  He feels markedly better now.  They gave him medications with EMS but continued to have respiratory distress until he was in the ER.  He is currently off BIPAP and feeling much better.  **Interim History He states he is feeling better but stopped IVF and gave another dose of IV Lasix. ECHO showed Systolic and Diastolic CHF so Cardiology was consulted for further evaluation.   Assessment & Plan:   Principal Problem:   Acute respiratory failure with hypoxia (HCC) Active Problems:   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HCC)   Chronic kidney disease (CKD), stage III (moderate) (HCC)   Anemia in chronic kidney disease   Essential hypertension, benign   Bipolar 1 disorder (HCC)   Multifocal pneumonia   Flash pulmonary edema (HCC)  Acute respiratory failure -Patient without known respiratory compromise presenting with acute onset of respiratory failure -Marked improvement with BIPAP, now off BIPAP and on [place] O2 with ongoing improvement -Suspect this was related to a combination of PNA and flash pulmonary edema in the setting of CHF -Continue Supplemental O2 via [place] and Wean O2 as tolerated -Continuous Pulse Oximetry and Maintain O2 Saturations >90% -Repeat CXR in AM   Multifocal PNA with sepsis physiology -SIRS criteria in this patient includes: Leukocytosis, tachycardia, tachypnea, and hypoxia  -Patient has evidence  of acute organ failure with elevated lactate  -While awaiting blood cultures, this appears to be a preseptic condition -Sepsis protocol initiated -Suspected source is PNA - which correlates with respiratory compromise and apparent multifocal infiltrates appreciated on both CXR and chest CT -CURB-65 score is 1 and he was admitted  -Pneumonia Severity Index (PSI) is Class 4, 9% mortality. -Corticosteroids have been to shown to low overall mortality rate; risk of ARDS; and need for mechanical ventilation.  This is particularly true in severe PNA (class 3+ PSI).  Will add stress-dosed steroids since this patient is immunocompromised due to Remicade infusions and he is now on Hydrocortisone 50 mg q6h and will wean to q8h -Will start Azithromycin 500 mg daily AND Rocephin due to no risk factors for MDR cause. -Additional complicating factors include: hypoxia/hypoxemia; and immunocompromise. As a result, will admit the patient to the hospital. -Held IVF for now given apparent flash pulmonary edema but overnight was started on NS at 100 mL/hr which I have discontinued  -Fever control -Repeat CBC in am -WBC went from 12.9 -> 12.2 -Sputum cultures never done  -Blood cultures x2 Showed NGTD at 1 day  -Will order lower respiratory tract procalcitonin level.  Antibiotics would not be indicated for PCT <0.1 and probably should not be used for < 0.25.  >0.5 indicates infection and >>0.5 indicates more serious disease.  As the procalcitonin level normalizes, it will be reasonable to consider de-escalation of antibiotic coverage. -Will trend lactate to ensure improvement and went from 4.0 -> 2.5 -Repeat  CXR in AM   Flash pulmonary edema in the setting of Acute Systolic and Diastolic CHF -Patient without significant respiratory symptoms prior to acute respiratory episode. -Suspect that he was developing PNA and that this led to R heart strain with resultant flash pulmonary edema. -This improved with BIPAP and  he is now stable from a respiratory standpoint.   -Will continue [place] O2. -He still has crackles and so was started Lasix 40 mg IV BID but stopped due to Lactic Acidosis and was given IVF and then 20 mg of IV Lasix overnight; IVF was still going so I Stopped it and will give one dose of IV Lasix 40 mg  -BNP was elevated at 490.0; this may be related to CKD but he does not have another known baseline. -ECHOCardiogram showing Systolic and Diastolic Dysfunction and LV Diffuse Hypokinesis  -Strict I's/O's and Daily Weights; Weight was 169 yesterday but never done this AM  -Patient is +1.349 Liters since admission -Continue to Monitor for Volume Overload  -Will Consult Cardiology for further evaluation and recommendations  Crohn's Disease -He receives Remicade infusions and so is immunocompromised. -Currently without concern for flare. -Closely Monitor   Stage 3 CKD  -Appears to be stable at this time. -BUN/Cr went from 17/2.10 -> 20/1.88 -Given IV Lasix and was given IVF with NS at 100 mL/hr which is now discontinued -Avoid Nephrotoxic Medications and Contrast Dyes if possible -Repeat CMP in AM   Anemia of CKD/Macrocytic Anemia -On Procrit injections monthly. -Patient's Hb/Hct went from 11.2/33.0 -> 9.0/28.8 and ? Dilutional Drop -Check Anemia Panel in the AM -Continue to Monitor for S/Sx of Bleeding -Repeat CBC In AM   HTN -Continue Amlodipine 2.5 mg po Daily and Toprol XL  Bipolar -Continue Depakote (Low at 18), Zyprexa, and Prazosin.  Right Ear Blockage -Will order Debrox  DVT prophylaxis: Enoxaparin 40 mg sq q24h Code Status: FULL CODE Family Communication: No family present at bedside  Disposition Plan: Remain Inopatient for further evaluation and treatment .  Consultants:   Cardiology   Procedures: ECHOCARDIOGRAM IMPRESSIONS    1. The left ventricle has moderate-severely reduced systolic function, with an ejection fraction of 30-35%. The cavity size was  moderate to severely dilated. There is mild concentric left ventricular hypertrophy. Left ventricular diastolic Doppler  parameters are consistent with impaired relaxation. Left ventricular diffuse hypokinesis.  2. The right ventricle has normal systolic function. The cavity was normal. There is no increase in right ventricular wall thickness.  3. The aortic valve is tricuspid Aortic valve regurgitation is trivial by color flow Doppler.  4. The inferior vena cava was normal in size with <50% respiratory variability.  FINDINGS  Left Ventricle: The left ventricle has moderate-severely reduced systolic function, with an ejection fraction of 30-35%. The cavity size was moderate to severely dilated. There is mild concentric left ventricular hypertrophy. Left ventricular diastolic  Doppler parameters are consistent with impaired relaxation. Left ventricular diffuse hypokinesis. Right Ventricle: The right ventricle has normal systolic function. The cavity was normal. There is no increase in right ventricular wall thickness. Left Atrium: left atrial size was normal in size Right Atrium: right atrial size was normal in size. Right atrial pressure is estimated at 8 mmHg. Interatrial Septum: No atrial level shunt detected by color flow Doppler. Pericardium: There is no evidence of pericardial effusion. Mitral Valve: The mitral valve is normal in structure. Mitral valve regurgitation is mild by color flow Doppler. Tricuspid Valve: The tricuspid valve is normal in structure. Tricuspid valve  regurgitation is trivial by color flow Doppler. Aortic Valve: The aortic valve is tricuspid Aortic valve regurgitation is trivial by color flow Doppler. There is no evidence of aortic valve stenosis. Pulmonic Valve: The pulmonic valve was grossly normal. Pulmonic valve regurgitation is not visualized by color flow Doppler. Pulmonary Artery: The pulmonary artery is of normal size and origin. Venous: The inferior vena cava  is normal in size with less than 50% respiratory variability.   LEFT VENTRICLE PLAX 2D LVIDd:         6.30 cm       Diastology LVIDs:         5.40 cm       LV e' lateral:   6.53 cm/s LV PW:         1.20 cm       LV E/e' lateral: 14.7 LV IVS:        1.30 cm       LV e' medial:    7.94 cm/s LVOT diam:     2.50 cm       LV E/e' medial:  12.1 LV SV:         60 ml LV SV Index:   30.32 LVOT Area:     4.91 cm   LV Volumes (MOD) LV area d, A4C:    50.40 cm LV area s, A4C:    38.10 cm LV major d, A4C:   8.95 cm LV major s, A4C:   8.45 cm LV vol d, MOD A4C: 234.0 ml LV vol s, MOD A4C: 142.0 ml LV SV MOD A4C:     234.0 ml  RIGHT VENTRICLE RV Basal diam:  3.30 cm RV S prime:     19.60 cm/s TAPSE (M-mode): 2.8 cm RVSP:           21.5 mmHg  LEFT ATRIUM             Index       RIGHT ATRIUM           Index LA diam:        3.90 cm 1.92 cm/m  RA Pressure: 8 mmHg LA Vol (A2C):   37.4 ml 18.44 ml/m RA Area:     14.60 cm LA Vol (A4C):   54.9 ml 27.06 ml/m RA Volume:   34.30 ml  16.91 ml/m LA Biplane Vol: 49.5 ml 24.40 ml/m  AORTIC VALVE LVOT Vmax:   102.00 cm/s LVOT Vmean:  70.200 cm/s LVOT VTI:    0.189 m AR PHT:      599 msec   AORTA Ao Root diam: 3.80 cm  MITRAL VALVE               TRICUSPID VALVE MV Area (PHT): 22.31 cm   TR Peak grad:   13.5 mmHg MV PHT:        9.86 msec   TR Vmax:        218.00 cm/s MV Decel Time: 34 msec     RVSP:           21.5 mmHg MV E velocity: 96.00 cm/s MV A velocity: 135.00 cm/s SHUNTS MV E/A ratio:  0.71        Systemic VTI:  0.19 m                            Systemic Diam: 2.50 cm    Antimicrobials:  Anti-infectives (From admission, onward)   Start  Dose/Rate Route Frequency Ordered Stop   05/06/18 0800  cefTRIAXone (ROCEPHIN) 2 g in sodium chloride 0.9 % 100 mL IVPB  Status:  Discontinued     2 g 200 mL/hr over 30 Minutes Intravenous Every 24 hours 05/05/18 0837 05/05/18 0843   05/06/18 0700  cefTRIAXone (ROCEPHIN) 2 g in sodium  chloride 0.9 % 100 mL IVPB     2 g 200 mL/hr over 30 Minutes Intravenous Every 24 hours 05/05/18 0843     05/05/18 0845  azithromycin (ZITHROMAX) 500 mg in sodium chloride 0.9 % 250 mL IVPB     500 mg 250 mL/hr over 60 Minutes Intravenous Every 24 hours 05/05/18 0837     05/05/18 0845  cefTRIAXone (ROCEPHIN) 1 g in sodium chloride 0.9 % 100 mL IVPB  Status:  Discontinued     1 g 200 mL/hr over 30 Minutes Intravenous NOW 05/05/18 0840 05/05/18 0841   05/05/18 0645  vancomycin (VANCOCIN) 1,500 mg in sodium chloride 0.9 % 500 mL IVPB     1,500 mg 250 mL/hr over 120 Minutes Intravenous  Once 05/05/18 0643 05/05/18 1055   05/05/18 0630  ceFEPIme (MAXIPIME) 1 g in sodium chloride 0.9 % 100 mL IVPB     1 g 200 mL/hr over 30 Minutes Intravenous  Once 05/05/18 0621 05/05/18 0825     Subjective: In and examined at bedside and states that he is feeling a little bit better but states he is having a very hard time hearing from his right ear.  No nausea or vomiting.  Denies any chest pain, lightheadedness or dizziness and states that his shortness breath is improved significantly.  States that his legs are still somewhat swollen.  No other concerns or complaints at this time.  Objective: Vitals:   05/05/18 2314 05/05/18 2358 05/06/18 0823 05/06/18 1635  BP:   133/84 139/85  Pulse:   (!) 112 98  Resp:   19 18  Temp:   97.6 F (36.4 C) 98.7 F (37.1 C)  TempSrc:   Oral   SpO2: 94%  98% 98%  Weight:  76.7 kg    Height:  6' 4"  (1.93 m)      Intake/Output Summary (Last 24 hours) at 05/06/2018 1649 Last data filed at 05/06/2018 1500 Gross per 24 hour  Intake 3149.73 ml  Output 1400 ml  Net 1749.73 ml   Filed Weights   05/05/18 2358  Weight: 76.7 kg   Examination: Physical Exam:  Constitutional: WN/WD AAM in NAD and appears calm and comfortable Eyes: Lids and conjunctivae normal, sclerae anicteric  ENMT: External Ears, Nose appear normal. Hard of hearing. Mucous membranes are moist.   Neck: Appears normal, supple, no cervical masses, normal ROM, no appreciable thyromegaly Respiratory: Diminished to auscultation bilaterally, no wheezing, rales, rhonchi or crackles. Normal respiratory effort and patient is not tachypenic. No accessory muscle use.  Cardiovascular: RRR, no murmurs / rubs / gallops. S1 and S2 auscultated. 1+ LE extremity edema. Abdomen: Soft, non-tender, non-distended. No masses palpated. No appreciable hepatosplenomegaly. Bowel sounds positive x4.  GU: Deferred. Musculoskeletal: No clubbing / cyanosis of digits/nails. No joint deformity upper and lower extremities.  Skin: No rashes, lesions, ulcers on a limited skin evaluation. No induration; Warm and dry.  Neurologic: CN 2-12 grossly intact with no focal deficits.  Romberg sign and cerebellar reflexes not assessed.  Psychiatric: Normal judgment and insight. Alert and oriented x 3. Normal mood and appropriate affect.   Data Reviewed: I have personally reviewed following labs and  imaging studies  CBC: Recent Labs  Lab 05/05/18 0343 05/05/18 0503 05/06/18 0252  WBC 12.9*  --  12.2*  HGB 10.6* 11.2* 9.0*  HCT 35.4* 33.0* 28.8*  MCV 109.3*  --  103.6*  PLT 211  --  518   Basic Metabolic Panel: Recent Labs  Lab 05/05/18 0343 05/05/18 0502 05/05/18 0503 05/06/18 0252  NA 142  --  143 139  K 3.6  --  3.9 4.2  CL 109  --   --  110  CO2 25  --   --  21*  GLUCOSE 158*  --   --  119*  BUN 17  --   --  20  CREATININE 2.12* 2.10*  --  1.88*  CALCIUM 8.5*  --   --  8.2*   GFR: Estimated Creatinine Clearance: 38 mL/min (A) (by C-G formula based on SCr of 1.88 mg/dL (H)). Liver Function Tests: No results for input(s): AST, ALT, ALKPHOS, BILITOT, PROT, ALBUMIN in the last 168 hours. No results for input(s): LIPASE, AMYLASE in the last 168 hours. No results for input(s): AMMONIA in the last 168 hours. Coagulation Profile: No results for input(s): INR, PROTIME in the last 168 hours. Cardiac Enzymes:  Recent Labs  Lab 05/05/18 0343  TROPONINI <0.03   BNP (last 3 results) No results for input(s): PROBNP in the last 8760 hours. HbA1C: No results for input(s): HGBA1C in the last 72 hours. CBG: No results for input(s): GLUCAP in the last 168 hours. Lipid Profile: No results for input(s): CHOL, HDL, LDLCALC, TRIG, CHOLHDL, LDLDIRECT in the last 72 hours. Thyroid Function Tests: No results for input(s): TSH, T4TOTAL, FREET4, T3FREE, THYROIDAB in the last 72 hours. Anemia Panel: No results for input(s): VITAMINB12, FOLATE, FERRITIN, TIBC, IRON, RETICCTPCT in the last 72 hours. Sepsis Labs: Recent Labs  Lab 05/05/18 0834  05/05/18 1548 05/05/18 1841 05/05/18 2228 05/06/18 0252  PROCALCITON <0.10  --   --   --   --   --   LATICACIDVEN  --    < > 3.3* 3.8* 3.5* 2.5*   < > = values in this interval not displayed.    Recent Results (from the past 240 hour(s))  Blood culture (routine x 2)     Status: None (Preliminary result)   Collection Time: 05/05/18  8:07 AM  Result Value Ref Range Status   Specimen Description BLOOD RIGHT HAND  Final   Special Requests   Final    BOTTLES DRAWN AEROBIC AND ANAEROBIC Blood Culture adequate volume   Culture   Final    NO GROWTH 1 DAY Performed at [facility], [address], [place], [place] [address]    Report Status PENDING  Incomplete  Blood culture (routine x 2)     Status: None (Preliminary result)   Collection Time: 05/05/18  8:15 AM  Result Value Ref Range Status   Specimen Description BLOOD LEFT HAND  Final   Special Requests   Final    BOTTLES DRAWN AEROBIC AND ANAEROBIC Blood Culture adequate volume   Culture   Final    NO GROWTH 1 DAY Performed at [facility], [place] [address], [place], [place] [address]    Report Status PENDING  Incomplete    Radiology Studies: Ct Chest Wo Contrast  Result Date: 05/05/2018 CLINICAL DATA:  Shortness of breath.  Abnormal chest x-ray EXAM: CT CHEST WITHOUT CONTRAST TECHNIQUE:  Multidetector CT imaging of the chest was performed following the standard protocol without  IV contrast. COMPARISON:  Chest x-ray from earlier today.  01/20/2017 chest CT FINDINGS: Cardiovascular: No cardiomegaly. Small pericardial effusion that is slightly increased. No acute vascular finding. Coronary atherosclerosis. Mediastinum/Nodes: Mild nodal prominence considered reactive in this setting. Lungs/Pleura: There is generalized airway thickening. Mild patchy nodular and airspace opacities in the right middle and lower lobes. Patchy ground-glass density in the bilateral apex. There is dependent atelectasis and septal thickening with trace pleural effusions. Mild emphysema Upper Abdomen: Cholecystectomy which accounts for common bile duct prominence where visualized. Bilateral partially covered renal cystic densities. Musculoskeletal: No acute finding. IMPRESSION: 1. Multifocal pneumonia. 2. Mild interstitial edema with trace pleural effusions and pericardial effusion. 3. Lower lobe atelectasis. 4. Mild emphysema. Electronically Signed   By: Monte Fantasia M.D.   On: 05/05/2018 05:46   Dg Chest Portable 1 View  Result Date: 05/05/2018 CLINICAL DATA:  Shortness of breath EXAM: PORTABLE CHEST 1 VIEW COMPARISON:  05/31/2017 FINDINGS: Mild cardiac enlargement. New perihilar infiltration could represent edema or multifocal pneumonia. No blunting of costophrenic angles. No pneumothorax. Mediastinal contours appear intact. IMPRESSION: New perihilar infiltration suggesting edema or multifocal pneumonia. Cardiac enlargement. Electronically Signed   By: Lucienne Capers M.D.   On: 05/05/2018 04:02   Scheduled Meds: . allopurinol  200 mg Oral Daily  . amLODipine  2.5 mg Oral Daily  . divalproex  1,500 mg Oral QHS  . docusate sodium  100 mg Oral BID  . enoxaparin (LOVENOX) injection  40 mg Subcutaneous Q24H  . [START ON 05/31/2018] epoetin alfa  10,000 Units Subcutaneous Q28 days  . ferrous sulfate  325 mg Oral BID   . hydrocortisone sod succinate (SOLU-CORTEF) inj  50 mg Intravenous Q6H  . magnesium oxide  800 mg Oral TID  . metoprolol succinate  50 mg Oral Daily  . mirabegron ER  25 mg Oral Daily  . OLANZapine  7.5 mg Oral QHS  . pantoprazole  40 mg Oral Daily  . prazosin  5 mg Oral QHS  . sodium chloride flush  3 mL Intravenous Q12H   Continuous Infusions: . sodium chloride 100 mL/hr at 05/06/18 0908  . azithromycin 500 mg (05/06/18 0908)  . cefTRIAXone (ROCEPHIN)  IV 2 g (05/06/18 0636)    LOS: 1 day    Kerney Elbe, DO [facility] PAGER is on [place]  If 7PM-7AM, please contact night-coverage [email or web address] Password [facility] 05/06/2018, 4:49 PM

## 2018-05-06 NOTE — TOC Initial Note (Signed)
Transition of Care [facility]) - Initial/Assessment Note    Patient Details  Name: Brent [name] MRN: [id number] Date of Birth: 01-29-1946  Transition of [facility] CM/SW Contact:    Carles Collet, RN Phone Number: 05/06/2018, 3:30 PM  Clinical Narrative:                Damaris Schooner w patient at bedside. He states that he lives at home alone. He confirms that he drives and denies barriers getting medications or getting to pharmacy. He states that he does not need any DME he recently was treated as outpatient for PT and he states "they fixed up."  PCP [place] of choice [facility] on [address]. He gets some meds through [place] and others through his [facility] Medicare.    Expected Discharge Plan: Home/Self Care Barriers to Discharge: No Barriers Identified   Patient Goals and CMS Choice        Expected Discharge Plan and Services Expected Discharge Plan: Home/Self Care Discharge Planning Services: CM Consult                              Prior Living Arrangements/Services   Lives with:: Self                   Activities of Daily Living Home Assistive Devices/Equipment: None ADL Screening (condition at time of admission) Patient's cognitive ability adequate to safely complete daily activities?: Yes Is the patient deaf or have difficulty hearing?: Yes Does the patient have difficulty seeing, even when wearing glasses/contacts?: No Does the patient have difficulty concentrating, remembering, or making decisions?: No Patient able to express need for assistance with ADLs?: Yes Does the patient have difficulty dressing or bathing?: No Independently performs ADLs?: Yes (appropriate for developmental age) Does the patient have difficulty walking or climbing stairs?: No Weakness of Legs: None Weakness of Arms/Hands: None  Permission Sought/Granted                  Emotional Assessment       Orientation: : Oriented to Self, Oriented to Place, Oriented to  Time, Oriented  to Situation      Admission diagnosis:  Acute pulmonary edema ([place]) [J81.0] Multifocal pneumonia [J18.9] Patient Active Problem List   Diagnosis Date Noted  . Acute respiratory failure with hypoxia ([place]) 05/05/2018  . Multifocal pneumonia 05/05/2018  . Flash pulmonary edema ([place]) 05/05/2018  . Candida esophagitis ([place]) 09/22/2017  . History of smoking 30 or more pack years 01/05/2017  . Bipolar 1 disorder ([place])   . BPH (benign prostatic hyperplasia) 03/31/2013  . 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 Essential hypertension, benign 03/31/2013  . Chronic kidney disease (CKD), stage III (moderate) ([place]) 06/04/2012  . 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[place]) 01/23/2010   PCP:  Gayland Curry, DO Pharmacy:   [id number] [place], [place] - [place] [address] [place] [address] Phone: [phone] Fax: [phone]  [facility] [place], [place] - [place] [address] [place] [address] Phone: [phone] Fax: [phone]     Social Determinants of Health (SDOH) Interventions    Readmission Risk Interventions 30 Day Unplanned Readmission Risk Score     ED to Hosp-Admission (Current) from 05/05/2018 in [place] [facility] [place] [facility]  30 Day Unplanned Readmission Risk Score (%)  23 Filed  at 05/06/2018 1200     This score is the patient's risk of an unplanned readmission within 30 days of being discharged (0 -100%). The score is based on dignosis, age, lab data, medications, orders, and past utilization.   Low:  0-14.9   Medium: 15-21.9   High: 22-29.9   Extreme: 30 and above       No flowsheet data found.

## 2018-05-07 ENCOUNTER — Inpatient Hospital Stay (HOSPITAL_COMMUNITY): Payer: Medicare Other

## 2018-05-07 DIAGNOSIS — I5021 Acute systolic (congestive) heart failure: Secondary | ICD-10-CM

## 2018-05-07 LAB — CBC WITH DIFFERENTIAL/PLATELET
Abs Immature Granulocytes: 0.06 10*3/uL (ref 0.00–0.07)
BASOS ABS: 0 10*3/uL (ref 0.0–0.1)
BASOS PCT: 0 %
EOS ABS: 0 10*3/uL (ref 0.0–0.5)
EOS PCT: 0 %
HCT: 29.3 % — ABNORMAL LOW (ref 39.0–52.0)
Hemoglobin: 9.3 g/dL — ABNORMAL LOW (ref 13.0–17.0)
Immature Granulocytes: 1 %
Lymphocytes Relative: 10 %
Lymphs Abs: 1.1 10*3/uL (ref 0.7–4.0)
MCH: 32.9 pg (ref 26.0–34.0)
MCHC: 31.7 g/dL (ref 30.0–36.0)
MCV: 103.5 fL — ABNORMAL HIGH (ref 80.0–100.0)
Monocytes Absolute: 0.3 10*3/uL (ref 0.1–1.0)
Monocytes Relative: 3 %
Neutro Abs: 10 10*3/uL — ABNORMAL HIGH (ref 1.7–7.7)
Neutrophils Relative %: 86 %
Platelets: 174 10*3/uL (ref 150–400)
RBC: 2.83 MIL/uL — ABNORMAL LOW (ref 4.22–5.81)
RDW: 13.5 % (ref 11.5–15.5)
WBC: 11.6 10*3/uL — ABNORMAL HIGH (ref 4.0–10.5)
nRBC: 0 % (ref 0.0–0.2)

## 2018-05-07 LAB — COMPREHENSIVE METABOLIC PANEL
ALT: 8 U/L (ref 0–44)
AST: 10 U/L — ABNORMAL LOW (ref 15–41)
Albumin: 2.6 g/dL — ABNORMAL LOW (ref 3.5–5.0)
Alkaline Phosphatase: 53 U/L (ref 38–126)
Anion gap: 8 (ref 5–15)
BUN: 25 mg/dL — ABNORMAL HIGH (ref 8–23)
CO2: 25 mmol/L (ref 22–32)
Calcium: 8.6 mg/dL — ABNORMAL LOW (ref 8.9–10.3)
Chloride: 108 mmol/L (ref 98–111)
Creatinine, Ser: 1.9 mg/dL — ABNORMAL HIGH (ref 0.61–1.24)
GFR calc Af Amer: 40 mL/min — ABNORMAL LOW (ref >60)
GFR calc non Af Amer: 34 mL/min — ABNORMAL LOW (ref >60)
Glucose, Bld: 100 mg/dL — ABNORMAL HIGH (ref 70–99)
Potassium: 3.9 mmol/L (ref 3.5–5.1)
Sodium: 141 mmol/L (ref 135–145)
Total Bilirubin: 0.5 mg/dL (ref 0.3–1.2)
Total Protein: 6 g/dL — ABNORMAL LOW (ref 6.5–8.1)

## 2018-05-07 LAB — MAGNESIUM: MAGNESIUM: 1.4 mg/dL — AB (ref 1.7–2.4)

## 2018-05-07 LAB — TSH: TSH: 0.202 u[IU]/mL — ABNORMAL LOW (ref 0.350–4.500)

## 2018-05-07 LAB — PHOSPHORUS: Phosphorus: 2.7 mg/dL (ref 2.5–4.6)

## 2018-05-07 MED ORDER — LOPERAMIDE HCL 2 MG PO CAPS
4.0000 mg | ORAL_CAPSULE | Freq: Once | ORAL | Status: AC
  Administered 2018-05-08: 4 mg via ORAL
  Filled 2018-05-07: qty 2

## 2018-05-07 MED ORDER — MAGNESIUM SULFATE 2 GM/50ML IV SOLN
2.0000 g | Freq: Once | INTRAVENOUS | Status: AC
  Administered 2018-05-07: 2 g via INTRAVENOUS
  Filled 2018-05-07: qty 50

## 2018-05-07 MED ORDER — ISOSORBIDE MONONITRATE ER 30 MG PO TB24
30.0000 mg | ORAL_TABLET | Freq: Every day | ORAL | Status: DC
  Administered 2018-05-07 – 2018-05-10 (×4): 30 mg via ORAL
  Filled 2018-05-07 (×4): qty 1

## 2018-05-07 MED ORDER — HYDRALAZINE HCL 25 MG PO TABS
25.0000 mg | ORAL_TABLET | Freq: Three times a day (TID) | ORAL | Status: DC
  Administered 2018-05-07 – 2018-05-10 (×10): 25 mg via ORAL
  Filled 2018-05-07 (×10): qty 1

## 2018-05-07 MED ORDER — HYDROCORTISONE NA SUCCINATE PF 100 MG IJ SOLR
50.0000 mg | Freq: Two times a day (BID) | INTRAMUSCULAR | Status: DC
  Administered 2018-05-07 – 2018-05-08 (×2): 50 mg via INTRAVENOUS
  Filled 2018-05-07 (×2): qty 2

## 2018-05-07 MED ORDER — FUROSEMIDE 10 MG/ML IJ SOLN
40.0000 mg | Freq: Once | INTRAMUSCULAR | Status: AC
  Administered 2018-05-07: 40 mg via INTRAVENOUS
  Filled 2018-05-07: qty 4

## 2018-05-07 NOTE — Discharge Instructions (Signed)
Heart Failure °Heart failure is a condition in which the heart has trouble pumping blood because it has become weak or stiff. This means that the heart does not pump blood efficiently for the body to work well. For some people with heart failure, fluid may back up into the lungs and there may be swelling (edema) in the lower legs. Heart failure is usually a long-term (chronic) condition. It is important for you to take good care of yourself and follow the treatment plan from your health care provider. °What are the causes? °This condition is caused by some health problems, including: °· High blood pressure (hypertension). Hypertension causes the heart muscle to work harder than normal. High blood pressure eventually causes the heart to become stiff and weak. °· Coronary artery disease (CAD). CAD is the buildup of cholesterol and fat (plaques) in the arteries of the heart. °· Heart attack (myocardial infarction). Injured tissue, which is caused by the heart attack, does not contract as well and the heart's ability to pump blood is weakened. °· Abnormal heart valves. When the heart valves do not open and close properly, the heart muscle must pump harder to keep the blood flowing. °· Heart muscle disease (cardiomyopathy or myocarditis). Heart muscle disease is damage to the heart muscle from a variety of causes, such as drug or alcohol abuse, infections, or unknown causes. These can increase the risk of heart failure. °· Lung disease. When the lungs do not work properly, the heart must work harder. °What increases the risk? °Risk of heart failure increases as a person ages. This condition is also more likely to develop in people who: °· Are overweight. °· Are male. °· Smoke or chew tobacco. °· Abuse alcohol or illegal drugs. °· Have taken medicines that can damage the heart, such as chemotherapy drugs. °· Have diabetes. °? High blood sugar (glucose) is associated with high fat (lipid) levels in the blood. °? Diabetes  can also damage tiny blood vessels that carry nutrients to the heart muscle. °· Have abnormal heart rhythms. °· Have thyroid problems. °· Have low blood counts (anemia). °What are the signs or symptoms? °Symptoms of this condition include: °· Shortness of breath with activity, such as when climbing stairs. °· Persistent cough. °· Swelling of the feet, ankles, legs, or abdomen. °· Unexplained weight gain. °· Difficulty breathing when lying flat (orthopnea). °· Waking from sleep because of the need to sit up and get more air. °· Rapid heartbeat. °· Fatigue and loss of energy. °· Feeling light-headed, dizzy, or close to fainting. °· Loss of appetite. °· Nausea. °· Increased urination during the night (nocturia). °· Confusion. °How is this diagnosed? °This condition is diagnosed based on: °· Medical history, symptoms, and a physical exam. °· Diagnostic tests, which may include: °? Echocardiogram. °? Electrocardiogram (ECG). °? Chest X-ray. °? Blood tests. °? Exercise stress test. °? Radionuclide scans. °? Cardiac catheterization and angiogram. °How is this treated? °Treatment for this condition is aimed at managing the symptoms of heart failure. Medicines, behavioral changes, or other treatments may be necessary to treat heart failure. °Medicines °These may include: °· Angiotensin-converting enzyme (ACE) inhibitors. This type of medicine blocks the effects of a blood protein called angiotensin-converting enzyme. ACE inhibitors relax (dilate) the blood vessels and help to lower blood pressure. °· Angiotensin receptor blockers (ARBs). This type of medicine blocks the actions of a blood protein called angiotensin. ARBs dilate the blood vessels and help to lower blood pressure. °· Water pills (diuretics). Diuretics cause   the kidneys to remove salt and water from the blood. The extra fluid is removed through urination, leaving a lower volume of blood that the heart has to pump. °· Beta blockers. These improve heart muscle  strength and they prevent the heart from beating too quickly. °· Digoxin. This increases the force of the heartbeat. °Healthy behavior changes °These may include: °· Reaching and maintaining a healthy weight. °· Stopping smoking or chewing tobacco. °· Eating heart-healthy foods. °· Limiting or avoiding alcohol. °· Stopping use of street drugs (illegal drugs). °· Physical activity. °Other treatments °These may include: °· Surgery to open blocked coronary arteries or repair damaged heart valves. °· Placement of a biventricular pacemaker to improve heart muscle function (cardiac resynchronization therapy). This device paces both the right ventricle and left ventricle. °· Placement of a device to treat serious abnormal heart rhythms (implantable cardioverter defibrillator, or ICD). °· Placement of a device to improve the pumping ability of the heart (left ventricular assist device, or LVAD). °· Heart transplant. This can cure heart failure, and it is considered for certain patients who do not improve with other therapies. °Follow these instructions at home: °Medicines °· Take over-the-counter and prescription medicines only as told by your health care provider. Medicines are important in reducing the workload of your heart, slowing the progression of heart failure, and improving your symptoms. °? Do not stop taking your medicine unless your health care provider told you to do that. °? Do not skip any dose of medicine. °? Refill your prescriptions before you run out of medicine. You need your medicines every day. °Eating and drinking ° °· Eat heart-healthy foods. Talk with a dietitian to make an eating plan that is right for you. °? Choose foods that contain no trans fat and are low in saturated fat and cholesterol. Healthy choices include fresh or frozen fruits and vegetables, fish, lean meats, legumes, fat-free or low-fat dairy products, and whole-grain or high-fiber foods. °? Limit salt (sodium) if directed by your  health care provider. Sodium restriction may reduce symptoms of heart failure. Ask a dietitian to recommend heart-healthy seasonings. °? Use healthy cooking methods instead of frying. Healthy methods include roasting, grilling, broiling, baking, poaching, steaming, and stir-frying. °· Limit your fluid intake if directed by your health care provider. Fluid restriction may reduce symptoms of heart failure. °Lifestyle ° °· Stop smoking or using chewing tobacco. Nicotine and tobacco can damage your heart and your blood vessels. Do not use nicotine gum or patches before talking to your health care provider. °· Limit alcohol intake to no more than 1 drink per day for non-pregnant women and 2 drinks per day for men. One drink equals 12 oz of beer, 5 oz of wine, or 1½ oz of hard liquor. °? Drinking more than that is harmful to your heart. Tell your health care provider if you drink alcohol several times a week. °? Talk with your health care provider about whether any level of alcohol use is safe for you. °? If your heart has already been damaged by alcohol or you have severe heart failure, drinking alcohol should be stopped completely. °· Stop use of illegal drugs. °· Lose weight if directed by your health care provider. Weight loss may reduce symptoms of heart failure. °· Do moderate physical activity if directed by your health care provider. People who are elderly and people with severe heart failure should consult with a health care provider for physical activity recommendations. °Monitor important information ° °· Weigh   yourself every day. Keeping track of your weight daily helps you to notice excess fluid sooner. °? Weigh yourself every morning after you urinate and before you eat breakfast. °? Wear the same amount of clothing each time you weigh yourself. °? Record your daily weight. Provide your health care provider with your weight record. °· Monitor and record your blood pressure as told by your health care  provider. °· Check your pulse as told by your health care provider. °Dealing with extreme temperatures °· If the weather is extremely hot: °? Avoid vigorous physical activity. °? Use air conditioning or fans or seek a cooler location. °? Avoid caffeine and alcohol. °? Wear loose-fitting, lightweight, and light-colored clothing. °· If the weather is extremely cold: °? Avoid vigorous physical activity. °? Layer your clothes. °? Wear mittens or gloves, a hat, and a scarf when you go outside. °? Avoid alcohol. °General instructions °· Manage other health conditions such as hypertension, diabetes, thyroid disease, or abnormal heart rhythms as told by your health care provider. °· Learn to manage stress. If you need help to do this, ask your health care provider. °· Plan rest periods when fatigued. °· Get ongoing education and support as needed. °· Participate in or seek rehabilitation as needed to maintain or improve independence and quality of life. °· Stay up to date with immunizations. Keeping current on pneumococcal and influenza immunizations is especially important to prevent respiratory infections. °· Keep all follow-up visits as told by your health care provider. This is important. °Contact a health care provider if: °· You have a rapid weight gain. °· You have increasing shortness of breath that is unusual for you. °· You are unable to participate in your usual physical activities. °· You tire easily. °· You cough more than normal, especially with physical activity. °· You have any swelling or more swelling in areas such as your hands, feet, ankles, or abdomen. °· You are unable to sleep because it is hard to breathe. °· You feel like your heart is beating quickly (palpitations). °· You become dizzy or light-headed when you stand up. °Get help right away if: °· You have difficulty breathing. °· You notice or your family notices a change in your awareness, such as having trouble staying awake or having difficulty  with concentration. °· You have pain or discomfort in your chest. °· You have an episode of fainting (syncope). °This information is not intended to replace advice given to you by your health care provider. Make sure you discuss any questions you have with your health care provider. °Document Released: 02/10/2005 Document Revised: 01/09/2017 Document Reviewed: 09/05/2015 °[employer] Interactive Patient Education © 2019 [employer] ° °

## 2018-05-07 NOTE — Care Management Important Message (Signed)
Important Message  Patient Details  Name: Brent [name] MRN: [id number] Date of Birth: 02-03-1946   Medicare Important Message Given:  Yes    Orbie Pyo 05/07/2018, 2:51 PM

## 2018-05-07 NOTE — Progress Notes (Signed)
PROGRESS NOTE    Brent [name]  QPY:[id number] DOB: Apr 23, 1945 DOA: 05/05/2018 PCP: Gayland Curry, DO   Brief Narrative:  HPI per Dr. Karmen Bongo on 05/05/2018 Brent [name] is a 73 y.o. male with medical history significant of Crohn's; celiac disease; HTN; COPD with remote tobacco dependence; and bipolar d/o presenting with SOB.  About 0200 this AM, he woke up and was wet with sweat and couldn't breathe.  He was wheezing and extremely SOB.  He was totally fine last night when he went to bed.  He has not been coughing.  No URI symptoms, fever, etc prior to going to bed last night.  He feels markedly better now.  They gave him medications with EMS but continued to have respiratory distress until he was in the ER.  He is currently off BIPAP and feeling much better.  **Interim History He states he is feeling better but stopped IVF and gave another dose of IV Lasix yesterday and one this AM. ECHO showed Systolic and Diastolic CHF so Cardiology was consulted for further evaluation and pending evaluation.   Assessment & Plan:   Principal Problem:   Acute respiratory failure with hypoxia (HCC) Active Problems:   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HCC)   Chronic kidney disease (CKD), stage III (moderate) (HCC)   Anemia in chronic kidney disease   Essential hypertension, benign   Bipolar 1 disorder (HCC)   Multifocal pneumonia   Flash pulmonary edema (HCC)  Acute Respiratory Failure -Patient without known respiratory compromise presenting with acute onset of respiratory failure -Marked improvement with BIPAP, now off BIPAP and on [place] O2 with ongoing improvement -Suspect this was related to a combination of PNA and flash pulmonary edema in the setting of CHF -Continue Supplemental O2 via [place] and Wean O2 as tolerated; Now down to 2 Liters  -Continuous Pulse Oximetry and Maintain O2 Saturations >90% -Repeat CXR this AM showed Improving perihilar interstitial airspace opacities favors resolved  interstitial edema. Stable trace bilateral pleural effusions and bibasilar atelectasis. Stable cardiomegaly -Continue Treatment as Below -Will need Ambulatory Home O2 Screen Prior to D/C   Multifocal PNA with Sepsis Physiology -SIRS criteria in this patient includes: Leukocytosis, tachycardia, tachypnea, and hypoxia  -Patient has evidence of acute organ failure with elevated lactate  -While awaiting blood cultures, this appears to be a preseptic condition -Sepsis protocol initiated -Suspected source is PNA - which correlates with respiratory compromise and apparent multifocal infiltrates appreciated on both CXR and chest CT -CURB-65 score is 1 and he was admitted  -Pneumonia Severity Index (PSI) is Class 4, 9% mortality. -Corticosteroids have been to shown to low overall mortality rate; risk of ARDS; and need for mechanical ventilation.  This is particularly true in severe PNA (class 3+ PSI).  Will add stress-dosed steroids since this patient is immunocompromised due to Remicade infusions and he is now on Hydrocortisone 50 mg q8h and will wean further to 50 mg q12h -Will start Azithromycin 500 mg daily AND Rocephin due to no risk factors for MDR cause. -Additional complicating factors include: hypoxia/hypoxemia; and immunocompromise. As a result, will admit the patient to the hospital. -IVF has now been discontinued  -Fever control -Repeat CBC in am -WBC went from 12.9 -> 12.2 -> 11.6 -Sputum cultures never done  -Blood cultures x2 Showed NGTD at 1 day still   -Will order lower respiratory tract procalcitonin level.  Antibiotics would not be indicated for PCT <0.1 and probably should not be used for < 0.25.  >0.5  indicates infection and >>0.5 indicates more serious disease.  As the procalcitonin level normalizes, it will be reasonable to consider de-escalation of antibiotic coverage. -Will trend lactate to ensure improvement and went from 4.0 -> 2.5 -Repeat CXR in AM   Flash pulmonary  edema in the setting of Acute Systolic and Diastolic CHF -Patient without significant respiratory symptoms prior to acute respiratory episode. -Suspect that he was developing PNA and that this led to R heart strain with resultant flash pulmonary edema. -This improved with BIPAP and he is now stable from a respiratory standpoint.   -Will continue [place] O2. -He still has crackles and so was started Lasix 40 mg IV BID but stopped due to Lactic Acidosis and was given IVF and then 20 mg of IV Lasix overnight; IVF was still going so I Stopped it and will give one dose of IV Lasix 40 mg  -BNP was elevated at 490.0; this may be related to CKD but he does not have another known baseline. -ECHOCardiogram showing Systolic and Diastolic Dysfunction and LV Diffuse Hypokinesis  -Strict I's/O's and Daily Weights and Fluid Restrict to 1500 mL; Weight was 169 yesterday but never done this AM again -Patient is +1.702 Liters since admission -Continue to Monitor for Volume Overload  -Will Consult Cardiology for further evaluation and recommendations  Crohn's Disease -He receives Remicade infusions and so is immunocompromised. -Currently without concern for flare. -Closely Monitor   Stage 3 CKD  -Appears to be stable at this time. -BUN/Cr went from 17/2.10 -> 20/1.88 -> 25/1.90 -Given IV Lasix again and was given IVF with NS at 100 mL/hr discontinued -Avoid Nephrotoxic Medications and Contrast Dyes if possible -Repeat CMP in AM   Anemia of CKD/Macrocytic Anemia -On Procrit injections monthly. -Patient's Hb/Hct went from 11.2/33.0 -> 9.0/28.8 and ? Dilutional Drop; Repeat this AM was 9.3/29.3 -Check Anemia Panel in the AM -Continue to Monitor for S/Sx of Bleeding -Repeat CBC In AM   HTN -Continue Amlodipine 2.5 mg po Daily and Toprol XL 50 mg po Daily  Bipolar -Continue Depakote 1500 mg po qHS (Low at 18), Zyprexa 7.5 mg po qHS, and Prazosin 5 mg po qHS.  Right Ear Blockage -Will order Debrox -Now  complaining of Left Ear also so will order Debrox for that as well  Hypomagnesemia -Patient's Mag Level this AM was 1.4 -Replete with IV Mag Sulfate 2 Grams and will continue Home Mag Oxide 800 mg po TID -Continue to Monitor and Replete as Necessary -Repeat Mag Level in the AM   DVT prophylaxis: Enoxaparin 40 mg sq q24h Code Status: FULL CODE Family Communication: No family present at bedside  Disposition Plan: Remain Inopatient for further evaluation and treatment .  Consultants:   Cardiology   Procedures: ECHOCARDIOGRAM IMPRESSIONS    1. The left ventricle has moderate-severely reduced systolic function, with an ejection fraction of 30-35%. The cavity size was moderate to severely dilated. There is mild concentric left ventricular hypertrophy. Left ventricular diastolic Doppler  parameters are consistent with impaired relaxation. Left ventricular diffuse hypokinesis.  2. The right ventricle has normal systolic function. The cavity was normal. There is no increase in right ventricular wall thickness.  3. The aortic valve is tricuspid Aortic valve regurgitation is trivial by color flow Doppler.  4. The inferior vena cava was normal in size with <50% respiratory variability.  FINDINGS  Left Ventricle: The left ventricle has moderate-severely reduced systolic function, with an ejection fraction of 30-35%. The cavity size was moderate to severely dilated.  There is mild concentric left ventricular hypertrophy. Left ventricular diastolic  Doppler parameters are consistent with impaired relaxation. Left ventricular diffuse hypokinesis. Right Ventricle: The right ventricle has normal systolic function. The cavity was normal. There is no increase in right ventricular wall thickness. Left Atrium: left atrial size was normal in size Right Atrium: right atrial size was normal in size. Right atrial pressure is estimated at 8 mmHg. Interatrial Septum: No atrial level shunt detected by color  flow Doppler. Pericardium: There is no evidence of pericardial effusion. Mitral Valve: The mitral valve is normal in structure. Mitral valve regurgitation is mild by color flow Doppler. Tricuspid Valve: The tricuspid valve is normal in structure. Tricuspid valve regurgitation is trivial by color flow Doppler. Aortic Valve: The aortic valve is tricuspid Aortic valve regurgitation is trivial by color flow Doppler. There is no evidence of aortic valve stenosis. Pulmonic Valve: The pulmonic valve was grossly normal. Pulmonic valve regurgitation is not visualized by color flow Doppler. Pulmonary Artery: The pulmonary artery is of normal size and origin. Venous: The inferior vena cava is normal in size with less than 50% respiratory variability.   LEFT VENTRICLE PLAX 2D LVIDd:         6.30 cm       Diastology LVIDs:         5.40 cm       LV e' lateral:   6.53 cm/s LV PW:         1.20 cm       LV E/e' lateral: 14.7 LV IVS:        1.30 cm       LV e' medial:    7.94 cm/s LVOT diam:     2.50 cm       LV E/e' medial:  12.1 LV SV:         60 ml LV SV Index:   30.32 LVOT Area:     4.91 cm   LV Volumes (MOD) LV area d, A4C:    50.40 cm LV area s, A4C:    38.10 cm LV major d, A4C:   8.95 cm LV major s, A4C:   8.45 cm LV vol d, MOD A4C: 234.0 ml LV vol s, MOD A4C: 142.0 ml LV SV MOD A4C:     234.0 ml  RIGHT VENTRICLE RV Basal diam:  3.30 cm RV S prime:     19.60 cm/s TAPSE (M-mode): 2.8 cm RVSP:           21.5 mmHg  LEFT ATRIUM             Index       RIGHT ATRIUM           Index LA diam:        3.90 cm 1.92 cm/m  RA Pressure: 8 mmHg LA Vol (A2C):   37.4 ml 18.44 ml/m RA Area:     14.60 cm LA Vol (A4C):   54.9 ml 27.06 ml/m RA Volume:   34.30 ml  16.91 ml/m LA Biplane Vol: 49.5 ml 24.40 ml/m  AORTIC VALVE LVOT Vmax:   102.00 cm/s LVOT Vmean:  70.200 cm/s LVOT VTI:    0.189 m AR PHT:      599 msec   AORTA Ao Root diam: 3.80 cm  MITRAL VALVE               TRICUSPID VALVE  MV Area (PHT): 22.31 cm   TR Peak grad:   13.5 mmHg MV  PHT:        9.86 msec   TR Vmax:        218.00 cm/s MV Decel Time: 34 msec     RVSP:           21.5 mmHg MV E velocity: 96.00 cm/s MV A velocity: 135.00 cm/s SHUNTS MV E/A ratio:  0.71        Systemic VTI:  0.19 m                            Systemic Diam: 2.50 cm    Antimicrobials:  Anti-infectives (From admission, onward)   Start     Dose/Rate Route Frequency Ordered Stop   05/06/18 0800  cefTRIAXone (ROCEPHIN) 2 g in sodium chloride 0.9 % 100 mL IVPB  Status:  Discontinued     2 g 200 mL/hr over 30 Minutes Intravenous Every 24 hours 05/05/18 0837 05/05/18 0843   05/06/18 0700  cefTRIAXone (ROCEPHIN) 2 g in sodium chloride 0.9 % 100 mL IVPB     2 g 200 mL/hr over 30 Minutes Intravenous Every 24 hours 05/05/18 0843     05/05/18 0845  azithromycin (ZITHROMAX) 500 mg in sodium chloride 0.9 % 250 mL IVPB     500 mg 250 mL/hr over 60 Minutes Intravenous Every 24 hours 05/05/18 0837     05/05/18 0845  cefTRIAXone (ROCEPHIN) 1 g in sodium chloride 0.9 % 100 mL IVPB  Status:  Discontinued     1 g 200 mL/hr over 30 Minutes Intravenous NOW 05/05/18 0840 05/05/18 0841   05/05/18 0645  vancomycin (VANCOCIN) 1,500 mg in sodium chloride 0.9 % 500 mL IVPB     1,500 mg 250 mL/hr over 120 Minutes Intravenous  Once 05/05/18 0643 05/05/18 1055   05/05/18 0630  ceFEPIme (MAXIPIME) 1 g in sodium chloride 0.9 % 100 mL IVPB     1 g 200 mL/hr over 30 Minutes Intravenous  Once 05/05/18 9163 05/05/18 0825     Subjective: Seen and and examined at bedside and states that he is feeling better respiratory wise but still requiring O2.  Pain, nausea, vomiting.  No other concerns or complaints at this time except that he states his right ear still blocked and feels like his left ear would benefit from Debrox as well.  Objective: Vitals:   05/06/18 0823 05/06/18 1635 05/06/18 2300 05/07/18 0747  BP: 133/84 139/85 128/84 (!) 134/93  Pulse: (!) 112 98 99  93  Resp: 19 18 16 18   Temp: 97.6 F (36.4 C) 98.7 F (37.1 C) 98.7 F (37.1 C) (!) 97.5 F (36.4 C)  TempSrc: Oral  Oral Oral  SpO2: 98% 98% 99% 97%  Weight:      Height:        Intake/Output Summary (Last 24 hours) at 05/07/2018 1358 Last data filed at 05/06/2018 2300 Gross per 24 hour  Intake 1828.07 ml  Output 1675 ml  Net 153.07 ml   Filed Weights   05/05/18 2358  Weight: 76.7 kg   Examination: Physical Exam:  Constitutional: Well-nourished, well-developed African-American male currently no acute distress appears calm and comfortable laying in bed and states he is feeling a little bit better Eyes: Lids and conjunctive are normal.  Sclera anicteric ENMT: External ears and nose appear normal.  Patient is hard of hearing.  Mucous members are moist Neck: Appears supple no JVD Respiratory: Diminished to auscultation bilaterally with coarse breath sounds but no appreciable  wheezing, rales, rhonchi.  Slight crackles noted.  Patient has a normal respiratory effort and he is not tachypneic but he is wearing supplemental oxygen via nasal cannula at 2 L Cardiovascular: Regular rate and rhythm.  No appreciable murmurs, rubs, gallops.  Has 1+ lower extremity edema noted Abdomen: Soft, nontender, nondistended.  Bowel sounds present GU: Deferred Musculoskeletal: No clubbing or cyanosis.  No joint deformities in upper or lower extremities Skin: No appreciable rashes or lesions on limited skin evaluation. Neurologic: Cranial nerves II through XII grossly intact no appreciable focal deficits.  Romberg sign cerebellar reflexes were not assessed Psychiatric: Normal judgment and insight.  Patient is awake, alert, and oriented x3.  Pleasant mood and affect  Data Reviewed: I have personally reviewed following labs and imaging studies  CBC: Recent Labs  Lab 05/05/18 0343 05/05/18 0503 05/06/18 0252 05/07/18 0336  WBC 12.9*  --  12.2* 11.6*  NEUTROABS  --   --   --  10.0*  HGB 10.6*  11.2* 9.0* 9.3*  HCT 35.4* 33.0* 28.8* 29.3*  MCV 109.3*  --  103.6* 103.5*  PLT 211  --  174 193   Basic Metabolic Panel: Recent Labs  Lab 05/05/18 0343 05/05/18 0502 05/05/18 0503 05/06/18 0252 05/07/18 0336  NA 142  --  143 139 141  K 3.6  --  3.9 4.2 3.9  CL 109  --   --  110 108  CO2 25  --   --  21* 25  GLUCOSE 158*  --   --  119* 100*  BUN 17  --   --  20 25*  CREATININE 2.12* 2.10*  --  1.88* 1.90*  CALCIUM 8.5*  --   --  8.2* 8.6*  MG  --   --   --   --  1.4*  PHOS  --   --   --   --  2.7   GFR: Estimated Creatinine Clearance: 37.6 mL/min (A) (by C-G formula based on SCr of 1.9 mg/dL (H)). Liver Function Tests: Recent Labs  Lab 05/07/18 0336  AST 10*  ALT 8  ALKPHOS 53  BILITOT 0.5  PROT 6.0*  ALBUMIN 2.6*   No results for input(s): LIPASE, AMYLASE in the last 168 hours. No results for input(s): AMMONIA in the last 168 hours. Coagulation Profile: No results for input(s): INR, PROTIME in the last 168 hours. Cardiac Enzymes: Recent Labs  Lab 05/05/18 0343  TROPONINI <0.03   BNP (last 3 results) No results for input(s): PROBNP in the last 8760 hours. HbA1C: No results for input(s): HGBA1C in the last 72 hours. CBG: No results for input(s): GLUCAP in the last 168 hours. Lipid Profile: No results for input(s): CHOL, HDL, LDLCALC, TRIG, CHOLHDL, LDLDIRECT in the last 72 hours. Thyroid Function Tests: Recent Labs    05/07/18 0336  TSH 0.202*   Anemia Panel: No results for input(s): VITAMINB12, FOLATE, FERRITIN, TIBC, IRON, RETICCTPCT in the last 72 hours. Sepsis Labs: Recent Labs  Lab 05/05/18 0834  05/05/18 1548 05/05/18 1841 05/05/18 2228 05/06/18 0252  PROCALCITON <0.10  --   --   --   --   --   LATICACIDVEN  --    < > 3.3* 3.8* 3.5* 2.5*   < > = values in this interval not displayed.    Recent Results (from the past 240 hour(s))  Blood culture (routine x 2)     Status: None (Preliminary result)   Collection Time: 05/05/18  8:07 AM  Result Value Ref Range Status   Specimen Description BLOOD RIGHT HAND  Final   Special Requests   Final    BOTTLES DRAWN AEROBIC AND ANAEROBIC Blood Culture adequate volume   Culture   Final    NO GROWTH 1 DAY Performed at [facility], [address], [place], [place] [address]    Report Status PENDING  Incomplete  Blood culture (routine x 2)     Status: None (Preliminary result)   Collection Time: 05/05/18  8:15 AM  Result Value Ref Range Status   Specimen Description BLOOD LEFT HAND  Final   Special Requests   Final    BOTTLES DRAWN AEROBIC AND ANAEROBIC Blood Culture adequate volume   Culture   Final    NO GROWTH 1 DAY Performed at [facility], [place] [address], [place], [place] [address]    Report Status PENDING  Incomplete    Radiology Studies: Dg Chest Port 1 View  Result Date: 05/07/2018 CLINICAL DATA:  73 year old male with shortness of breath EXAM: PORTABLE CHEST 1 VIEW COMPARISON:  Prior chest x-ray 05/05/2018 FINDINGS: Cardiomegaly, stable compared to prior imaging. Mediastinal contours are within normal limits. No evidence of pulmonary edema, pleural effusion, pneumothorax or focal airspace consolidation. Slight interval improvement of perihilar interstitial infiltrates suggest resolution of mild edema. Otherwise, the background bronchitic changes are similar. Trace bilateral pleural effusions and bibasilar atelectasis. No acute osseous abnormality. IMPRESSION: 1. Improving perihilar interstitial airspace opacities favors resolved interstitial edema. 2. Stable trace bilateral pleural effusions and bibasilar atelectasis. 3. Stable cardiomegaly. Electronically Signed   By: Jacqulynn Cadet M.D.   On: 05/07/2018 08:57   Scheduled Meds: . allopurinol  200 mg Oral Daily  . amLODipine  2.5 mg Oral Daily  . carbamide peroxide  5 drop Right EAR BID  . divalproex  1,500 mg Oral QHS  . docusate sodium  100 mg Oral BID  . enoxaparin (LOVENOX) injection  40 mg  Subcutaneous Q24H  . [START ON 05/31/2018] epoetin alfa  10,000 Units Subcutaneous Q28 days  . ferrous sulfate  325 mg Oral BID  . hydrocortisone sod succinate (SOLU-CORTEF) inj  50 mg Intravenous Q8H  . magnesium oxide  800 mg Oral TID  . metoprolol succinate  50 mg Oral Daily  . mirabegron ER  25 mg Oral Daily  . OLANZapine  7.5 mg Oral QHS  . pantoprazole  40 mg Oral Daily  . prazosin  5 mg Oral QHS  . sodium chloride flush  3 mL Intravenous Q12H   Continuous Infusions: . azithromycin 500 mg (05/07/18 0949)  . cefTRIAXone (ROCEPHIN)  IV 2 g (05/07/18 0650)    LOS: 2 days    Kerney Elbe, DO [facility] PAGER is on AMION  If 7PM-7AM, please contact night-coverage [email or web address] Password [facility] 05/07/2018, 1:58 PM

## 2018-05-07 NOTE — Consult Note (Addendum)
Cardiology Consultation:   Patient ID: Brent [name] MRN: [id number]; DOB: 1945/08/27  Admit date: 05/05/2018 Date of Consult: 05/07/2018  Primary Care Provider: Gayland Curry, DO Primary Cardiologist: Wesson Stith Martinique, MD  Primary Electrophysiologist:  None    Patient Profile:   Brent [name] is a 73 y.o. male with a hx of Crohn's disease, celiac disease, CKD stage III, hypertension, COPD with remote tobacco dependence and bipolar disorder who is being seen today for the evaluation of CHF at the request of Dr. Alfredia Ferguson.  History of Present Illness:   Brent [name] was admitted to the hospital on 05/05/2018 with wheezing and extreme shortness of breath.  He required BiPAP briefly but improved significantly once in the ER.  He denied any URI symptoms, fever or cough.  Apparently he was fine when he went to bed the night before.  Initial chest x-ray showed new perihilar infiltration suggesting edema or multifocal pneumonia as well as cardiac enlargement.  Follow-up chest x-ray today showed improving interstitial airspace opacities favor resolved interstitial edema.  There were stable trace bilateral pleural effusions and bibasilar atelectasis.  Stable cardiomegaly.  BNP was 490.9.  Troponin was negative x2.  Echocardiogram done on 05/05/2018 showed moderate-severely reduced systolic function with EF 30-35%, moderate to severely dilated LV, mild concentric left ventricular hypertrophy, diastolic dysfunction and diffuse hypokinesis.  There was trivial aortic valve regurgitation.  Coronary atherosclerosis was noted on CT of the chest.  On my assessment Brent [name] is a very pleasant gentleman.  He lives alone and does all of his daily activities independently.  He does his housework, cooks, shops, drives, and visits people in nursing homes.  He denies any prior cardiac history and has never seen a cardiologist.  Although he does report that about a year ago he had significant heavy chest pressure but no shortness  of breath that lasted for about 30 minutes and resolved with Tylenol.  He did not seek medical attention because it did not involve his arm.  He is followed locally for primary care, with the [facility] for Crohn's disease and with nephrology, Dr. Posey Pronto.  The patient says that he had gone to bed feeling fine and then awoke at 2 AM sweating with shortness of breath and wheezing.  He had no time had any chest discomfort.  He had no nausea or vomiting or palpitations.  He notes that he has had lower extremity edema for about a year.  He denies any orthopnea or PND.  He has frequent dizziness for which he was prescribed meclizine but says it does not help.  He has had no syncope.  The patient has significantly improved.  Breathing is back to normal and lower extremity edema is nearly resolved.  The patient does not drink alcohol.  He smokes cigarettes for about 50 years but stopped 3 years ago.  He denies knowledge of any family heart disease except possibly in his father but he is not sure.  When asked about salt intake the patient says that he loves salt.  The night before his symptoms began he had eaten chicken wings, canned beans and home canned greens.  He says that it was all very salty.  Past Medical History:  Diagnosis Date   Anemia    Anxiety    Benign paroxysmal positional vertigo    Bipolar I disorder, most recent episode (or current) unspecified    Celiac disease    Cellulitis and abscess of unspecified site    Chronic kidney disease,  stage III (moderate) (HCC)    Crohn's    Remicade q8 weeks   Depression    Dizziness and giddiness    Essential and other specified forms of tremor    medication-induced Parkinson's, now resolved   Essential hypertension, benign    Gout 2018   Hypertrophy of prostate without urinary obstruction and other lower urinary tract symptoms (LUTS)    Hypopotassemia    Impotence of organic origin    Insomnia with sleep apnea, unspecified    Iron  deficiency anemia, unspecified    Loss of weight    Malnutrition of mild degree (HCC)    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 Authorizing Provider  allopurinol (ZYLOPRIM) 100 MG tablet Take 2 tablets (200 mg total) by mouth daily. 04/10/16  Yes Lauree Chandler, NP  amLODipine (NORVASC) 2.5 MG tablet Take 2.5 mg by mouth daily.   Yes [provider]  Calcium Carbonate-Vit D-Min 600-400 MG-UNIT TABS Take 1 tablet by mouth 3 (three) times daily.    Yes [provider]  carbamide peroxide (DEBROX) 6.5 % OTIC solution Place 5 drops into both ears 2 (two) times daily. For 5 days   Yes [provider]  cholecalciferol (VITAMIN D) 1000 units tablet Take 1,000 Units by mouth daily.    Yes [provider]  divalproex (DEPAKOTE) 500 MG EC tablet Take 1,500 mg by mouth at bedtime.    Yes [provider]  epoetin alfa (PROCRIT) 88891 UNIT/ML injection Inject 10,000 Units into the skin every 30 (thirty) days.    Yes [provider]  ferrous sulfate 325 (65 FE) MG tablet Take 325 mg by mouth 2 (two) times daily.     Yes [provider]  inFLIXimab (REMICADE) 100 MG injection Infuse Remicade IV schedule 1 69m/kg every 8 weeks Premedicate with Tylenol 500-6557mby mouth and Benadryl 25-5063my mouth prior to infusion. Last PPD was on 12/2009.  11/22/10  Yes JacMilus BanisterD  Loperamide HCl (LOPERAMIDE A-D PO) Take 1 capsule by mouth as needed (loose stools).    Yes  [provider]  magnesium oxide (MAG-OX) 400 (241.3 Mg) MG tablet Take 2,400 mg by mouth 3 (three) times daily.  04/17/17  Yes [provider]  meclizine (ANTIVERT) 25 MG tablet Take 25 mg by mouth 3 (three) times daily as needed for dizziness.  06/21/16  Yes [provider]  metoprolol succinate (TOPROL-XL) 50 MG 24 hr tablet Take 50 mg by mouth daily. Take with or immediately following a meal.   Yes [provider]  mirabegron ER (MYRBETRIQ) 25 MG TB24 tablet Take 1 tablet (25 mg total) by mouth daily. 02/10/18  Yes EubLauree ChandlerP  Multiple Vitamin (MULTIVITAMIN) tablet Take 1 tablet by mouth daily.     Yes [provider]  OLANZapine (ZYPREXA) 7.5 MG tablet Take 1 tablet (7.5 mg total) by mouth at bedtime. 01/03/16  Yes Reed, Tiffany L, DO  omeprazole (PRILOSEC) 20  MG capsule Take 20 mg by mouth daily before supper.     Yes [provider]  prazosin (MINIPRESS) 5 MG capsule Take 5 mg by mouth at bedtime.   Yes [provider]  vitamin E 400 UNIT capsule Take 400 Units by mouth daily.     Yes [provider]    Inpatient Medications: Scheduled Meds:  allopurinol  200 mg Oral Daily   amLODipine  2.5 mg Oral Daily   carbamide peroxide  5 drop Right EAR BID   divalproex  1,500 mg Oral QHS   docusate sodium  100 mg Oral BID   enoxaparin (LOVENOX) injection  40 mg Subcutaneous Q24H   [START ON 05/31/2018] epoetin alfa  10,000 Units Subcutaneous Q28 days   ferrous sulfate  325 mg Oral BID   hydrocortisone sod succinate (SOLU-CORTEF) inj  50 mg Intravenous Q12H   magnesium oxide  800 mg Oral TID   metoprolol succinate  50 mg Oral Daily   mirabegron ER  25 mg Oral Daily   OLANZapine  7.5 mg Oral QHS   pantoprazole  40 mg Oral Daily   prazosin  5 mg Oral QHS   sodium chloride flush  3 mL Intravenous Q12H   Continuous Infusions:  azithromycin Stopped (05/07/18 1200)   cefTRIAXone (ROCEPHIN)  IV  Stopped (05/07/18 0730)   PRN Meds: acetaminophen **OR** acetaminophen, albuterol, ondansetron **OR** ondansetron (ZOFRAN) IV, polyethylene glycol  Allergies:    Allergies  Allergen Reactions   Azathioprine     REACTION: affected WBC   Ciprofloxacin     Pt denies any reaction   Levaquin [Levofloxacin In D5w]     Pt denies any reaction   Plendil [Felodipine]     Pt denies any reaction    Social History:   Social History   Socioeconomic History   Marital status: Divorced    Spouse name: Not on file   Number of children: 1   Years of education: Not on file   Highest education level: Not on file  Occupational History   Occupation: retired   Occupation: Associate Professor strain: Not hard at all   Food insecurity:    Worry: Never true    Inability: Never true   Transportation needs:    Medical: No    Non-medical: No  Tobacco Use   Smoking status: Former Smoker    Packs/day: 1.00    Years: 49.00    Pack years: 49.00    Types: Cigarettes    Start date: 04/11/1956    Last attempt to quit: 04/17/2014    Years since quitting: 4.0   Smokeless tobacco: Never Used  Substance and Sexual Activity   Alcohol use: No    Alcohol/week: 0.0 standard drinks   Drug use: No   Sexual activity: Never  Lifestyle   Physical activity:    Days per week: 0 days    Minutes per session: 0 min   Stress: Not at all  Relationships   Social connections:    Talks on phone: More than three times a week    Gets together: Three times a week    Attends religious service: More than 4 times per year    Active member of club or organization: Yes    Attends meetings of clubs or organizations: 1 to 4 times per year    Relationship status: Divorced   Intimate partner violence:    Fear of current or ex partner: No  Emotionally abused: No    Physically abused: No    Forced sexual activity: No  Other Topics Concern   Not on file  Social History  Narrative   Not on file    Family History:    Family History  Problem Relation Age of Onset   Diabetes Mother        maternal grandmother   Uterine cancer Mother    Emphysema Father    Pneumonia Maternal Grandmother    Colon cancer Neg Hx      ROS:  Please see the history of present illness.   All other ROS reviewed and negative.     Physical Exam/Data:   Vitals:   05/06/18 0823 05/06/18 1635 05/06/18 2300 05/07/18 0747  BP: 133/84 139/85 128/84 (!) 134/93  Pulse: (!) 112 98 99 93  Resp: 19 18 16 18   Temp: 97.6 F (36.4 C) 98.7 F (37.1 C) 98.7 F (37.1 C) (!) 97.5 F (36.4 C)  TempSrc: Oral  Oral Oral  SpO2: 98% 98% 99% 97%  Weight:      Height:        Intake/Output Summary (Last 24 hours) at 05/07/2018 1510 Last data filed at 05/06/2018 2300 Gross per 24 hour  Intake 1828.07 ml  Output 1475 ml  Net 353.07 ml   Last 3 Weights 05/05/2018 04/30/2018 04/29/2018  Weight (lbs) 169 lb 1.5 oz 162 lb 9.6 oz 162 lb  Weight (kg) 76.7 kg 73.755 kg 73.483 kg     Body mass index is 20.58 kg/m.  General:  Well nourished, well developed, in no acute distress HEENT: normal Lymph: no adenopathy Neck: no JVD Endocrine:  No thryomegaly Vascular: No carotid bruits; FA pulses 2+ bilaterally without bruits  Cardiac:  normal S1, S2; RRR; no murmur  Lungs:  clear to auscultation bilaterally, no wheezing, rhonchi or rales  Abd: soft, nontender, no hepatomegaly  Ext: Trace ankle edema Musculoskeletal:  No deformities, BUE and BLE strength normal and equal Skin: warm and dry  Neuro:  CNs 2-12 intact, no focal abnormalities noted Psych:  Normal affect   EKG:  The EKG was personally reviewed and demonstrates:  Sinus tachycardia Ventricular premature complex LVH with secondary repolarization abnormality Anterior Q waves, possibly due to LVH Telemetry:  Telemetry was personally reviewed and demonstrates: Sinus rhythm in the 90s-100s, 3 beats of NSVT at 0401 this  morning  Relevant CV Studies:  Echocardiogram 05/05/2018 IMPRESSIONS   1. The left ventricle has moderate-severely reduced systolic function, with an ejection fraction of 30-35%. The cavity size was moderate to severely dilated. There is mild concentric left ventricular hypertrophy. Left ventricular diastolic Doppler  parameters are consistent with impaired relaxation. Left ventricular diffuse hypokinesis.  2. The right ventricle has normal systolic function. The cavity was normal. There is no increase in right ventricular wall thickness.  3. The aortic valve is tricuspid Aortic valve regurgitation is trivial by color flow Doppler.  4. The inferior vena cava was normal in size with <50% respiratory variability.  FINDINGS  Left Ventricle: The left ventricle has moderate-severely reduced systolic function, with an ejection fraction of 30-35%. The cavity size was moderate to severely dilated. There is mild concentric left ventricular hypertrophy. Left ventricular diastolic  Doppler parameters are consistent with impaired relaxation. Left ventricular diffuse hypokinesis. Right Ventricle: The right ventricle has normal systolic function. The cavity was normal. There is no increase in right ventricular wall thickness. Left Atrium: left atrial size was normal in size Right Atrium: right atrial  size was normal in size. Right atrial pressure is estimated at 8 mmHg. Interatrial Septum: No atrial level shunt detected by color flow Doppler. Pericardium: There is no evidence of pericardial effusion. Mitral Valve: The mitral valve is normal in structure. Mitral valve regurgitation is mild by color flow Doppler. Tricuspid Valve: The tricuspid valve is normal in structure. Tricuspid valve regurgitation is trivial by color flow Doppler. Aortic Valve: The aortic valve is tricuspid Aortic valve regurgitation is trivial by color flow Doppler. There is no evidence of aortic valve stenosis. Pulmonic Valve: The  pulmonic valve was grossly normal. Pulmonic valve regurgitation is not visualized by color flow Doppler. Pulmonary Artery: The pulmonary artery is of normal size and origin. Venous: The inferior vena cava is normal in size with less than 50% respiratory variability.    Laboratory Data:  Chemistry Recent Labs  Lab 05/05/18 0343 05/05/18 0502 05/05/18 0503 05/06/18 0252 05/07/18 0336  NA 142  --  143 139 141  K 3.6  --  3.9 4.2 3.9  CL 109  --   --  110 108  CO2 25  --   --  21* 25  GLUCOSE 158*  --   --  119* 100*  BUN 17  --   --  20 25*  CREATININE 2.12* 2.10*  --  1.88* 1.90*  CALCIUM 8.5*  --   --  8.2* 8.6*  GFRNONAA 30*  --   --  35* 34*  GFRAA 35*  --   --  40* 40*  ANIONGAP 8  --   --  8 8    Recent Labs  Lab 05/07/18 0336  PROT 6.0*  ALBUMIN 2.6*  AST 10*  ALT 8  ALKPHOS 53  BILITOT 0.5   Hematology Recent Labs  Lab 05/05/18 0343 05/05/18 0503 05/06/18 0252 05/07/18 0336  WBC 12.9*  --  12.2* 11.6*  RBC 3.24*  --  2.78* 2.83*  HGB 10.6* 11.2* 9.0* 9.3*  HCT 35.4* 33.0* 28.8* 29.3*  MCV 109.3*  --  103.6* 103.5*  MCH 32.7  --  32.4 32.9  MCHC 29.9*  --  31.3 31.7  RDW 13.3  --  13.2 13.5  PLT 211  --  174 174   Cardiac Enzymes Recent Labs  Lab 05/05/18 0343  TROPONINI <0.03    Recent Labs  Lab 05/05/18 0500  TROPIPOC 0.00    BNP Recent Labs  Lab 05/05/18 0442  BNP 490.9*    DDimer No results for input(s): DDIMER in the last 168 hours.  Radiology/Studies:  Ct Chest Wo Contrast  Result Date: 05/05/2018 CLINICAL DATA:  Shortness of breath.  Abnormal chest x-ray EXAM: CT CHEST WITHOUT CONTRAST TECHNIQUE: Multidetector CT imaging of the chest was performed following the standard protocol without IV contrast. COMPARISON:  Chest x-ray from earlier today.  01/20/2017 chest CT FINDINGS: Cardiovascular: No cardiomegaly. Small pericardial effusion that is slightly increased. No acute vascular finding. Coronary atherosclerosis. Mediastinum/Nodes:  Mild nodal prominence considered reactive in this setting. Lungs/Pleura: There is generalized airway thickening. Mild patchy nodular and airspace opacities in the right middle and lower lobes. Patchy ground-glass density in the bilateral apex. There is dependent atelectasis and septal thickening with trace pleural effusions. Mild emphysema Upper Abdomen: Cholecystectomy which accounts for common bile duct prominence where visualized. Bilateral partially covered renal cystic densities. Musculoskeletal: No acute finding. IMPRESSION: 1. Multifocal pneumonia. 2. Mild interstitial edema with trace pleural effusions and pericardial effusion. 3. Lower lobe atelectasis. 4. Mild emphysema. Electronically Signed  By: Monte Fantasia M.D.   On: 05/05/2018 05:46   Dg Chest Port 1 View  Result Date: 05/07/2018 CLINICAL DATA:  73 year old male with shortness of breath EXAM: PORTABLE CHEST 1 VIEW COMPARISON:  Prior chest x-ray 05/05/2018 FINDINGS: Cardiomegaly, stable compared to prior imaging. Mediastinal contours are within normal limits. No evidence of pulmonary edema, pleural effusion, pneumothorax or focal airspace consolidation. Slight interval improvement of perihilar interstitial infiltrates suggest resolution of mild edema. Otherwise, the background bronchitic changes are similar. Trace bilateral pleural effusions and bibasilar atelectasis. No acute osseous abnormality. IMPRESSION: 1. Improving perihilar interstitial airspace opacities favors resolved interstitial edema. 2. Stable trace bilateral pleural effusions and bibasilar atelectasis. 3. Stable cardiomegaly. Electronically Signed   By: Jacqulynn Cadet M.D.   On: 05/07/2018 08:57   Dg Chest Portable 1 View  Result Date: 05/05/2018 CLINICAL DATA:  Shortness of breath EXAM: PORTABLE CHEST 1 VIEW COMPARISON:  05/31/2017 FINDINGS: Mild cardiac enlargement. New perihilar infiltration could represent edema or multifocal pneumonia. No blunting of costophrenic  angles. No pneumothorax. Mediastinal contours appear intact. IMPRESSION: New perihilar infiltration suggesting edema or multifocal pneumonia. Cardiac enlargement. Electronically Signed   By: Lucienne Capers M.D.   On: 05/05/2018 04:02   Vas [name] Le Art Seg Multi (segm&le Reynauds)  Result Date: 05/04/2018 LOWER EXTREMITY DOPPLER STUDY Indications: Problematic painful, thickened and elongated toenails. High Risk Factors: Hypertension, past history of smoking. Other Factors: Barely palpable tibial pulses, bilaterally, as well as decreased                capillary refill time to all toes. Skin on the calves and feet is                shiny and thin.  Comparison Study: None Performing Technologist: [employer] BS, RVT, RDCS  Examination Guidelines: A complete evaluation includes at minimum, Doppler waveform signals and systolic blood pressure reading at the level of bilateral brachial, anterior tibial, and posterior tibial arteries, when vessel segments are accessible. Bilateral testing is considered an integral part of a complete examination. Photoelectric Plethysmograph (PPG) waveforms and toe systolic pressure readings are included as required and additional duplex testing as needed. Limited examinations for reoccurring indications may be performed as noted.  ABI Findings: +---------+------------------+-----+---------+--------+  Right     Rt Pressure (mmHg) Index Waveform  Comment   +---------+------------------+-----+---------+--------+  Brachial  118                                          +---------+------------------+-----+---------+--------+  CFA                                triphasic           +---------+------------------+-----+---------+--------+  Popliteal                          triphasic           +---------+------------------+-----+---------+--------+  ATA       139                1.18  triphasic           +---------+------------------+-----+---------+--------+  PTA       144                1.22   triphasic           +---------+------------------+-----+---------+--------+  PERO      144                1.22  triphasic           +---------+------------------+-----+---------+--------+  Great Toe 143                1.21  Normal              +---------+------------------+-----+---------+--------+ +---------+------------------+-----+---------+-------+  Left      Lt Pressure (mmHg) Index Waveform  Comment  +---------+------------------+-----+---------+-------+  Brachial  113                                         +---------+------------------+-----+---------+-------+  CFA                                triphasic          +---------+------------------+-----+---------+-------+  Popliteal                          triphasic          +---------+------------------+-----+---------+-------+  ATA       142                1.20  triphasic          +---------+------------------+-----+---------+-------+  PTA       136                1.15  triphasic          +---------+------------------+-----+---------+-------+  PERO      137                1.16  triphasic          +---------+------------------+-----+---------+-------+  Great Toe 144                1.22  Normal             +---------+------------------+-----+---------+-------+ +-------+-----------+-----------+------------+------------+  ABI/TBI Today's ABI Today's TBI Previous ABI Previous TBI  +-------+-----------+-----------+------------+------------+  Right   1.22        1.21                                   +-------+-----------+-----------+------------+------------+  Left    1.2         1.22                                   +-------+-----------+-----------+------------+------------+ TOES Findings: +----------+---------------+--------+-------+  Right Toes Pressure (mmHg) Waveform Comment  +----------+---------------+--------+-------+  1st Digit  143             Normal            +----------+---------------+--------+-------+  2nd Digit                  Normal             +----------+---------------+--------+-------+  3rd Digit                  Normal            +----------+---------------+--------+-------+  4th Digit  Normal            +----------+---------------+--------+-------+  5th Digit                  Normal            +----------+---------------+--------+-------+ +---------+---------------+--------+-------+  Left Toes Pressure (mmHg) Waveform Comment  +---------+---------------+--------+-------+  1st Digit 144             Normal            +---------+---------------+--------+-------+  2nd Digit                 Normal            +---------+---------------+--------+-------+  3rd Digit                 Normal            +---------+---------------+--------+-------+  4th Digit                 Normal            +---------+---------------+--------+-------+  5th Digit                 Normal            +---------+---------------+--------+-------+   Summary: Right: Resting right ankle-brachial index is within normal range. No evidence of significant right lower extremity arterial disease. The right toe-brachial index is normal. RT great toe pressure = 143 mmHg. Left: Resting left ankle-brachial index is within normal range. No evidence of significant left lower extremity arterial disease. The left toe-brachial index is normal. LT Great toe pressure = 144 mmHg.  *See table(s) above for measurements and observations.  Electronically signed by Kathlyn Sacramento MD on 05/04/2018 at 5:10:29 PM.    Final     Assessment and Plan:   1.  Acute systolic and diastolic CHF/dilated cardiomyopathy -No prior cardiac history, although patient did have a notable episode of chest pain about 1 year ago for which she did not seek treatment. -Patient presented with sudden shortness of breath, sweating and wheezing -BNP elevated at 490.9.  Chest x-ray with interstitial edema and multifocal infiltrates. -Echocardiogram done on 05/05/2018 showed moderate-severely reduced LV systolic  function with EF 30-35%, moderate-severely dilated LV, diastolic dysfunction and diffuse hypokinesis. -Patient has been diuresing with IV Lasix, appears to have received 2 about 1 dose per day. -Patient has had 1.6 L urine output yesterday.  He did receive some IV fluids and is +1.7 L since admission.  No weights are recorded. -On home Toprol-XL 50 mg daily. Not on ACE-I/ARB due to renal function.  -Small amount of Coronary atherosclerosis noted on CT of the chest, looks to be in a diagonal branch. -Patient appears to be nearly euvolemic.  His breathing is back to baseline and lower extremity edema is nearly resolved. -Strict intake and output, daily weights, low-sodium diet. -The patient admits to frequent intake of high sodium foods.  We had a discussion about reducing salt in his diet and he is enthusiastic about working on that. -Will stop amlodipine and start Imdur 30 mg daily/hydralazine 25 mg TID. Titrate as tolerated.  -Unclear if decreased EF is related to this acute respiratory illness. Plan to follow up in our office, treat heart failure, repeat ech in about 8 weeks after medications optimized.  Can consider outpatient Myoview to eval for myocardial ischemia if EF does not improve or he has angina symptoms.  -He will likely be ready for discharge in the next day or  two. I will arrange office appointment.   2. Acute respiratory failure/PNA -Patient presented with acute onset respiratory failure.  Markedly improved with BiPAP.  -SIRS criteria met including leukocytosis, tachycardia, tachypnea and hypoxia.  -Suspected combination of pneumonia and flash pulmonary edema in setting of CHF. -Treatment for pneumonia with IV antibiotics per internal medicine. -Patient is much improved.  3. Chronic kidney disease stage 3 -Followed by nephrology, Dr. Posey Pronto, treated with Pro-crit.  -Baseline SCr appears to be ~1.7-2. SCr was 2.12 on admission, 1.90 today -Avoid nephrotoxic medications and  contrast dyes if possible  4. Hypertension -At home on amlodipine 2.5 mg daily, prazosin 5 mg nightly, Toprol-XL 50 mg daily -Med changes as above.  -BP well controlled, DBP a little high 80's-93. Watch on new meds.   5. Anemia of CKD -Hgb 9.3. On Pro-crit per nephrology.  -IM is managing.   For questions or updates, please contact [place] Please consult [email or web address] for contact info under     Signed, Daune Perch, NP  05/07/2018 3:10 PM Patient seen and examined and history reviewed. Agree with above findings and plan. Very pleasant 73 yo BM with no known history of cardiac disease but history of HTN, anemia, CKD presented with acute respiratory distress associated with feeling hot, sweating. Admitted initially on Bipap. Started on sepsis protocol. CXR and CT chest c/w multifocal PNA as well as pulmonary edema. Initially received a lot of fluids with sepsis protocol and antibiotics but since then has been diuresed with good effect. Breathing is much improved. Prior to these events no dyspnea. ? Mild pedal edema  On exam he is a pleasant gentleman in NAD No JVD Lungs are clear. CV RRR without gallop or murmur or rub No edema  Ecg shows NSR with LVH and repolarization abnormality. BNP 490 Troponin negative.  CXR and CT as noted above.  Creatinine 1.9.   Impresssion: 1. Acute systolic CHF. Unclear if he had pre-existing cardiomyopathy or if cardiomyopathy is related to acute sepsis syndrome. Clinically he is much better and appears euvolemic. Cardiomyopathy could be related to Hypertensive heart disease. Doubt ischemic. Recommend continuing Toprol XL. Will switch amlodipine to combination of isosorbide and Hydralazine. Titrate doses as tolerated for HR and BP. Recommend repeat Echo in 8-12 weeks once medication optimized. If EF remains low would need to consider ischemic evaluation with a Myoview study. He is not a candidate for ACEi/ARB/Entresto/aldactone due to CKD. 2.  Multifocal PNA/sepsis per primary team 3. CKD stage 3. Followed by Nephrology 4. HTN. Titrate medication to optimize BP 5. Anemia of chronic disease.   Taeler Winning [place], [place] 05/07/2018 4:02 PM

## 2018-05-08 ENCOUNTER — Inpatient Hospital Stay (HOSPITAL_COMMUNITY): Payer: Medicare Other

## 2018-05-08 DIAGNOSIS — J810 Acute pulmonary edema: Secondary | ICD-10-CM

## 2018-05-08 LAB — COMPREHENSIVE METABOLIC PANEL
ALT: 7 U/L (ref 0–44)
AST: 12 U/L — ABNORMAL LOW (ref 15–41)
Albumin: 2.5 g/dL — ABNORMAL LOW (ref 3.5–5.0)
Alkaline Phosphatase: 47 U/L (ref 38–126)
Anion gap: 7 (ref 5–15)
BILIRUBIN TOTAL: 0.6 mg/dL (ref 0.3–1.2)
BUN: 28 mg/dL — ABNORMAL HIGH (ref 8–23)
CO2: 26 mmol/L (ref 22–32)
Calcium: 8.5 mg/dL — ABNORMAL LOW (ref 8.9–10.3)
Chloride: 108 mmol/L (ref 98–111)
Creatinine, Ser: 2.13 mg/dL — ABNORMAL HIGH (ref 0.61–1.24)
GFR calc Af Amer: 35 mL/min — ABNORMAL LOW (ref >60)
GFR calc non Af Amer: 30 mL/min — ABNORMAL LOW (ref >60)
Glucose, Bld: 99 mg/dL (ref 70–99)
Potassium: 3.9 mmol/L (ref 3.5–5.1)
Sodium: 141 mmol/L (ref 135–145)
TOTAL PROTEIN: 5.8 g/dL — AB (ref 6.5–8.1)

## 2018-05-08 LAB — CBC WITH DIFFERENTIAL/PLATELET
Abs Immature Granulocytes: 0.05 10*3/uL (ref 0.00–0.07)
Basophils Absolute: 0 10*3/uL (ref 0.0–0.1)
Basophils Relative: 0 %
Eosinophils Absolute: 0 10*3/uL (ref 0.0–0.5)
Eosinophils Relative: 0 %
HEMATOCRIT: 28.5 % — AB (ref 39.0–52.0)
Hemoglobin: 9.1 g/dL — ABNORMAL LOW (ref 13.0–17.0)
Immature Granulocytes: 1 %
Lymphocytes Relative: 16 %
Lymphs Abs: 1.4 10*3/uL (ref 0.7–4.0)
MCH: 33.3 pg (ref 26.0–34.0)
MCHC: 31.9 g/dL (ref 30.0–36.0)
MCV: 104.4 fL — ABNORMAL HIGH (ref 80.0–100.0)
MONO ABS: 0.3 10*3/uL (ref 0.1–1.0)
Monocytes Relative: 4 %
Neutro Abs: 7.1 10*3/uL (ref 1.7–7.7)
Neutrophils Relative %: 79 %
Platelets: 159 10*3/uL (ref 150–400)
RBC: 2.73 MIL/uL — ABNORMAL LOW (ref 4.22–5.81)
RDW: 13.7 % (ref 11.5–15.5)
WBC: 8.9 10*3/uL (ref 4.0–10.5)
nRBC: 0 % (ref 0.0–0.2)

## 2018-05-08 LAB — PHOSPHORUS: Phosphorus: 3 mg/dL (ref 2.5–4.6)

## 2018-05-08 LAB — MAGNESIUM: Magnesium: 1.9 mg/dL (ref 1.7–2.4)

## 2018-05-08 LAB — T4, FREE: Free T4: 0.81 ng/dL — ABNORMAL LOW (ref 0.82–1.77)

## 2018-05-08 MED ORDER — AZITHROMYCIN 250 MG PO TABS
500.0000 mg | ORAL_TABLET | Freq: Every day | ORAL | Status: DC
  Administered 2018-05-09: 500 mg via ORAL
  Filled 2018-05-08: qty 2

## 2018-05-08 MED ORDER — HYDROCORTISONE NA SUCCINATE PF 100 MG IJ SOLR
50.0000 mg | Freq: Every day | INTRAMUSCULAR | Status: DC
  Administered 2018-05-09: 50 mg via INTRAVENOUS
  Filled 2018-05-08: qty 2

## 2018-05-08 NOTE — Progress Notes (Addendum)
Progress Note  Patient Name: Brent [name] Date of Encounter: 05/08/2018  Primary Cardiologist:   Peter [place], MD   Subjective   Feels good.  Wants to go home.  No pain.  No SOB.  Inpatient Medications    Scheduled Meds: . allopurinol  200 mg Oral Daily  . [START ON 05/09/2018] azithromycin  500 mg Oral Daily  . carbamide peroxide  5 drop Right EAR BID  . divalproex  1,500 mg Oral QHS  . enoxaparin (LOVENOX) injection  40 mg Subcutaneous Q24H  . [START ON 05/31/2018] epoetin alfa  10,000 Units Subcutaneous Q28 days  . ferrous sulfate  325 mg Oral BID  . hydrALAZINE  25 mg Oral Q8H  . hydrocortisone sod succinate (SOLU-CORTEF) inj  50 mg Intravenous Q12H  . isosorbide mononitrate  30 mg Oral Daily  . magnesium oxide  800 mg Oral TID  . metoprolol succinate  50 mg Oral Daily  . mirabegron ER  25 mg Oral Daily  . OLANZapine  7.5 mg Oral QHS  . pantoprazole  40 mg Oral Daily  . prazosin  5 mg Oral QHS  . sodium chloride flush  3 mL Intravenous Q12H   Continuous Infusions: . cefTRIAXone (ROCEPHIN)  IV 2 g (05/08/18 0812)   PRN Meds: acetaminophen **OR** acetaminophen, albuterol, ondansetron **OR** ondansetron (ZOFRAN) IV, polyethylene glycol   Vital Signs    Vitals:   05/07/18 0747 05/07/18 1636 05/07/18 2319 05/08/18 0726  BP: (!) 134/93 130/84 105/74 124/79  Pulse: 93 86 89 72  Resp: 18 16 18 16   Temp: (!) 97.5 F (36.4 C) (!) 97.3 F (36.3 C) 97.7 F (36.5 C)   TempSrc: Oral Oral Oral   SpO2: 97% 100% 96% 96%  Weight:      Height:        Intake/Output Summary (Last 24 hours) at 05/08/2018 1239 Last data filed at 05/07/2018 2030 Gross per 24 hour  Intake 830 ml  Output 1200 ml  Net -370 ml   Filed Weights   05/05/18 2358  Weight: 76.7 kg    Telemetry    NSR, one rune of 4 beats NSV5 - Personally Reviewed  ECG    NA - Personally Reviewed  Physical Exam   GEN: No  acute distress.   Neck: No  JVD Cardiac: RRR, no murmurs, rubs, or gallops.   Respiratory: Clear   to auscultation bilaterally. GI: Soft, nontender, non-distended, normal bowel sounds  MS:  No edema; No deformity. Neuro:   Nonfocal  Psych: Oriented and appropriate     Labs    Chemistry Recent Labs  Lab 05/06/18 0252 05/07/18 0336 05/08/18 0256  NA 139 141 141  K 4.2 3.9 3.9  CL 110 108 108  CO2 21* 25 26  GLUCOSE 119* 100* 99  BUN 20 25* 28*  CREATININE 1.88* 1.90* 2.13*  CALCIUM 8.2* 8.6* 8.5*  PROT  --  6.0* 5.8*  ALBUMIN  --  2.6* 2.5*  AST  --  10* 12*  ALT  --  8 7  ALKPHOS  --  53 47  BILITOT  --  0.5 0.6  GFRNONAA 35* 34* 30*  GFRAA 40* 40* 35*  ANIONGAP 8 8 7      Hematology Recent Labs  Lab 05/06/18 0252 05/07/18 0336 05/08/18 0256  WBC 12.2* 11.6* 8.9  RBC 2.78* 2.83* 2.73*  HGB 9.0* 9.3* 9.1*  HCT 28.8* 29.3* 28.5*  MCV 103.6* 103.5* 104.4*  MCH 32.4 32.9 33.3  MCHC  31.3 31.7 31.9  RDW 13.2 13.5 13.7  PLT 174 174 159    Cardiac Enzymes Recent Labs  Lab 05/05/18 0343  TROPONINI <0.03    Recent Labs  Lab 05/05/18 0500  TROPIPOC 0.00     BNP Recent Labs  Lab 05/05/18 0442  BNP 490.9*     DDimer No results for input(s): DDIMER in the last 168 hours.   Radiology    Dg Chest Port 1 View  Result Date: 05/08/2018 CLINICAL DATA:  Shortness of breath, pneumonia EXAM: PORTABLE CHEST 1 VIEW COMPARISON:  05/07/2018 FINDINGS: Mild left basilar opacity, favoring atelectasis. Right lung is clear. No frank interstitial edema. No pleural effusion or pneumothorax. The heart is normal in size. IMPRESSION: Mild left basilar opacity, favoring atelectasis. Electronically Signed   By: Julian Hy M.D.   On: 05/08/2018 07:43   Dg Chest Port 1 View  Result Date: 05/07/2018 CLINICAL DATA:  73 year old male with shortness of breath EXAM: PORTABLE CHEST 1 VIEW COMPARISON:  Prior chest x-ray 05/05/2018 FINDINGS: Cardiomegaly, stable compared to prior imaging. Mediastinal contours are within normal limits. No evidence of  pulmonary edema, pleural effusion, pneumothorax or focal airspace consolidation. Slight interval improvement of perihilar interstitial infiltrates suggest resolution of mild edema. Otherwise, the background bronchitic changes are similar. Trace bilateral pleural effusions and bibasilar atelectasis. No acute osseous abnormality. IMPRESSION: 1. Improving perihilar interstitial airspace opacities favors resolved interstitial edema. 2. Stable trace bilateral pleural effusions and bibasilar atelectasis. 3. Stable cardiomegaly. Electronically Signed   By: Jacqulynn Cadet M.D.   On: 05/07/2018 08:57    Cardiac Studies   ECHO  1. The left ventricle has moderate-severely reduced systolic function, with an ejection fraction of 30-35%. The cavity size was moderate to severely dilated. There is mild concentric left ventricular hypertrophy. Left ventricular diastolic Doppler  parameters are consistent with impaired relaxation. Left ventricular diffuse hypokinesis.  2. The right ventricle has normal systolic function. The cavity was normal. There is no increase in right ventricular wall thickness.  3. The aortic valve is tricuspid Aortic valve regurgitation is trivial by color flow Doppler.  4. The inferior vena cava was normal in size with <50% respiratory variability.   Patient Profile     73 y.o. male with a hx of Crohn's disease, celiac disease, CKD stage III, hypertension, COPD with remote tobacco dependence and bipolar disorder who is being seen for the evaluation of CHF at the request of Dr. Alfredia Ferguson.    Assessment & Plan    ACUTE SYSTOLIC AND DIASTOLIC HF:     Newly diagnosed reduced EF.  Net negative 24 hours.  Diuretics on hold.  He had a run of polymorphic VT only four beats.  However, I would like to exclude ischemia before discharge.   CKD III:  Creat is improved.  Continue to hold diuretic and ACE/ARB.  Continue current beta blocker.  I will plan a [employer] tomorrow.    HTN:  Continue  current meds.     For questions or updates, please contact [place] Please consult [email or web address] for contact info under Cardiology/STEMI.   Signed, Minus Breeding, MD  05/08/2018, 12:39 PM

## 2018-05-08 NOTE — Progress Notes (Signed)
PROGRESS NOTE    Brent [name]  MHD:[id number] DOB: 06-12-1945 DOA: 05/05/2018 PCP: Gayland Curry, DO   Brief Narrative:  HPI per Dr. Karmen Bongo on 05/05/2018 Brent [name] is a 73 y.o. male with medical history significant of Crohn's; celiac disease; HTN; COPD with remote tobacco dependence; and bipolar d/o presenting with SOB.  About 0200 this AM, he woke up and was wet with sweat and couldn't breathe.  He was wheezing and extremely SOB.  He was totally fine last night when he went to bed.  He has not been coughing.  No URI symptoms, fever, etc prior to going to bed last night.  He feels markedly better now.  They gave him medications with EMS but continued to have respiratory distress until he was in the ER.  He is currently off BIPAP and feeling much better.  **Interim History He states he is feeling better but stopped IVF and gave another dose of IV Lasix the day before yesterday and one yesterday AM but none today given Slight bump in Cr.Marland Kitchen ECHO showed Systolic and Diastolic CHF so Cardiology was consulted for further evaluation and they adjusted medications and stopped Amlodipine.They are recommending holding Lasix today and evaluating and patient is tolerating Isosorbide-Hydralazine combination. PT to evaluate the patient still.   Assessment & Plan:   Principal Problem:   Acute respiratory failure with hypoxia (HCC) Active Problems:   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HCC)   Chronic kidney disease (CKD), stage III (moderate) (HCC)   Anemia in chronic kidney disease   Essential hypertension, benign   Bipolar 1 disorder (HCC)   Multifocal pneumonia   Flash pulmonary edema (HCC)   Acute systolic CHF (congestive heart failure) (HCC)  Acute Respiratory Failure, improving  -Patient without known respiratory compromise presenting with acute onset of respiratory failure -Marked improvement with BIPAP, now off BIPAP and on [place] O2 with ongoing improvement; Will attempt to wean to Room Air   -Suspect this was related to a combination of PNA and flash pulmonary edema in the setting of CHF -Continue Supplemental O2 via [place] and Wean O2 as tolerated; Now down to 2 Liters yesterday and is still on 2 Liters and will attempt to Wean -Continuous Pulse Oximetry and Maintain O2 Saturations >90% -Repeat CXR this AM showed "Mild left basilar opacity, favoring atelectasis. Right lung is clear. No frank interstitial edema. No pleural effusion or pneumothorax. The heart is normal in size" -Continue Treatment as Below -Will need Ambulatory Home O2 Screen Prior to D/C  -Will need PT Evaluation and this is pending   Multifocal PNA with Sepsis Physiology -SIRS criteria in this patient includes: Leukocytosis, tachycardia, tachypnea, and hypoxia  -Patient has evidence of acute organ failure with elevated lactate  -While awaiting blood cultures, this appears to be a preseptic condition -Sepsis protocol initiated; Sepsis Physiology is now much improved  -Suspected source is PNA - which correlates with respiratory compromise and apparent multifocal infiltrates appreciated on both CXR and chest CT -CURB-65 score is 1 and he was admitted  -Pneumonia Severity Index (PSI) is Class 4, 9% mortality. -Corticosteroids have been to shown to low overall mortality rate; risk of ARDS; and need for mechanical ventilation.  This is particularly true in severe PNA (class 3+ PSI).  Will add stress-dosed steroids since this patient is immunocompromised due to Remicade infusions and he is now on Hydrocortisone 50 mg q12h and will wean further to 50 mg q24h -Started Azithromycin 500 mg daily AND Rocephin due to no  risk factors for MDR cause and will continue  -Additional complicating factors include: hypoxia/hypoxemia; and immunocompromise. As a result, will admit the patient to the hospital. -IVF has now been discontinued  -Fever control with Acetaminophen -Repeat CBC in am -WBC went from 12.9 -> 12.2 -> 11.6 -> 8.9  -Sputum cultures never done  -Blood cultures x2 Showed NGTD at 3 Days -Will order lower respiratory tract procalcitonin level and was <0.10 and will stop Abx in AM.  -Will trend lactate to ensure improvement and went from 4.0 -> 2.5 -Repeat CXR in AM   Flash pulmonary edema in the setting of Acute Systolic and Diastolic CHF, improved  -Patient without significant respiratory symptoms prior to acute respiratory episode. -Suspect that he was developing PNA and that this led to R heart strain with resultant flash pulmonary edema. -This improved with BIPAP and he is now stable from a respiratory standpoint.   -Will continue [place] O2. -He still has crackles and so was started Lasix 40 mg IV BID but stopped due to Lactic Acidosis and was given IVF and then 20 mg of IV Lasix overnight; IVF was still going so I Stopped it and will give one dose of IV Lasix 40 mg  -BNP was elevated at 490.0; this may be related to CKD but he does not have another known baseline. -ECHOCardiogram showing Systolic and Diastolic Dysfunction and LV Diffuse Hypokinesis  -Strict I's/O's and Daily Weights and Fluid Restrict to 1500 mL; Weight was 169 on admission but never done this AM again -Patient is +1.332 Liters since admission -Continue to Monitor for Volume Overload  -Cardiology consulted for CHF and recommending holding diuretics today and outpatient ECHO in 8-12 weeks  Crohn's Disease -He receives Remicade infusions and so is immunocompromised. -Currently without concern for flare. -On Stress Dose Steroids -Closely Monitor   Stage 3 CKD  -Slightly worsened  -BUN/Cr went from 17/2.10 -> 20/1.88 -> 25/1.90 -> 28/2.13 in the se -Holding IV Lasix today  -Avoid Nephrotoxic Medications and Contrast Dyes if possible -Repeat CMP in AM   Anemia of CKD/Macrocytic Anemia -On Procrit injections monthly. -Patient's Hb/Hct went from 11.2/33.0 -> 9.0/28.8 and ? Dilutional Drop; Repeat this AM was 9.1/28.5 -Check Anemia  Panel in the AM -Continue to Monitor for S/Sx of Bleeding -Repeat CBC In AM   HTN -Amlodipine 2.5 mg po Daily now discontinued -C/w Toprol XL 50 mg po Daily -Cardiology Started Isosorbide Mononitrate 30 mg po Daily and Hydralazine 25 mg po q8h -Cardiologyrecommending continuing these and optimizing BP control  Bipolar -Continue Depakote 1500 mg po qHS (Low at 18), Zyprexa 7.5 mg po qHS, and Prazosin 5 mg po qHS.  Right Ear Blockage -Will order Debrox -Now complaining of Left Ear also so will order Debrox for that as well  Hypomagnesemia, improved -Patient's Mag Level this AM was 1.9 -Replete with IV Mag Sulfate 2 Grams yesterday -Will continue Home Mag Oxide 800 mg po TID -Continue to Monitor and Replete as Necessary -Repeat Mag Level in the AM     DVT prophylaxis: Enoxaparin 40 mg sq q24h Code Status: FULL CODE Family Communication: No family present at bedside  Disposition Plan: Remain Inpatient for further evaluation and treatment and possible D/C in next 24-48 hours pending Cardiac Clearance   Consultants:   Cardiology   Procedures: ECHOCARDIOGRAM IMPRESSIONS    1. The left ventricle has moderate-severely reduced systolic function, with an ejection fraction of 30-35%. The cavity size was moderate to severely dilated. There is mild concentric  left ventricular hypertrophy. Left ventricular diastolic Doppler  parameters are consistent with impaired relaxation. Left ventricular diffuse hypokinesis.  2. The right ventricle has normal systolic function. The cavity was normal. There is no increase in right ventricular wall thickness.  3. The aortic valve is tricuspid Aortic valve regurgitation is trivial by color flow Doppler.  4. The inferior vena cava was normal in size with <50% respiratory variability.  FINDINGS  Left Ventricle: The left ventricle has moderate-severely reduced systolic function, with an ejection fraction of 30-35%. The cavity size was moderate to  severely dilated. There is mild concentric left ventricular hypertrophy. Left ventricular diastolic  Doppler parameters are consistent with impaired relaxation. Left ventricular diffuse hypokinesis. Right Ventricle: The right ventricle has normal systolic function. The cavity was normal. There is no increase in right ventricular wall thickness. Left Atrium: left atrial size was normal in size Right Atrium: right atrial size was normal in size. Right atrial pressure is estimated at 8 mmHg. Interatrial Septum: No atrial level shunt detected by color flow Doppler. Pericardium: There is no evidence of pericardial effusion. Mitral Valve: The mitral valve is normal in structure. Mitral valve regurgitation is mild by color flow Doppler. Tricuspid Valve: The tricuspid valve is normal in structure. Tricuspid valve regurgitation is trivial by color flow Doppler. Aortic Valve: The aortic valve is tricuspid Aortic valve regurgitation is trivial by color flow Doppler. There is no evidence of aortic valve stenosis. Pulmonic Valve: The pulmonic valve was grossly normal. Pulmonic valve regurgitation is not visualized by color flow Doppler. Pulmonary Artery: The pulmonary artery is of normal size and origin. Venous: The inferior vena cava is normal in size with less than 50% respiratory variability.   LEFT VENTRICLE PLAX 2D LVIDd:         6.30 cm       Diastology LVIDs:         5.40 cm       LV e' lateral:   6.53 cm/s LV PW:         1.20 cm       LV E/e' lateral: 14.7 LV IVS:        1.30 cm       LV e' medial:    7.94 cm/s LVOT diam:     2.50 cm       LV E/e' medial:  12.1 LV SV:         60 ml LV SV Index:   30.32 LVOT Area:     4.91 cm   LV Volumes (MOD) LV area d, A4C:    50.40 cm LV area s, A4C:    38.10 cm LV major d, A4C:   8.95 cm LV major s, A4C:   8.45 cm LV vol d, MOD A4C: 234.0 ml LV vol s, MOD A4C: 142.0 ml LV SV MOD A4C:     234.0 ml  RIGHT VENTRICLE RV Basal diam:  3.30 cm RV S  prime:     19.60 cm/s TAPSE (M-mode): 2.8 cm RVSP:           21.5 mmHg  LEFT ATRIUM             Index       RIGHT ATRIUM           Index LA diam:        3.90 cm 1.92 cm/m  RA Pressure: 8 mmHg LA Vol (A2C):   37.4 ml 18.44 ml/m RA Area:     14.60 cm  LA Vol (A4C):   54.9 ml 27.06 ml/m RA Volume:   34.30 ml  16.91 ml/m LA Biplane Vol: 49.5 ml 24.40 ml/m  AORTIC VALVE LVOT Vmax:   102.00 cm/s LVOT Vmean:  70.200 cm/s LVOT VTI:    0.189 m AR PHT:      599 msec   AORTA Ao Root diam: 3.80 cm  MITRAL VALVE               TRICUSPID VALVE MV Area (PHT): 22.31 cm   TR Peak grad:   13.5 mmHg MV PHT:        9.86 msec   TR Vmax:        218.00 cm/s MV Decel Time: 34 msec     RVSP:           21.5 mmHg MV E velocity: 96.00 cm/s MV A velocity: 135.00 cm/s SHUNTS MV E/A ratio:  0.71        Systemic VTI:  0.19 m                            Systemic Diam: 2.50 cm    Antimicrobials:  Anti-infectives (From admission, onward)   Start     Dose/Rate Route Frequency Ordered Stop   05/09/18 1000  azithromycin (ZITHROMAX) tablet 500 mg     500 mg Oral Daily 05/08/18 1154     05/06/18 0800  cefTRIAXone (ROCEPHIN) 2 g in sodium chloride 0.9 % 100 mL IVPB  Status:  Discontinued     2 g 200 mL/hr over 30 Minutes Intravenous Every 24 hours 05/05/18 0837 05/05/18 0843   05/06/18 0700  cefTRIAXone (ROCEPHIN) 2 g in sodium chloride 0.9 % 100 mL IVPB     2 g 200 mL/hr over 30 Minutes Intravenous Every 24 hours 05/05/18 0843     05/05/18 0845  azithromycin (ZITHROMAX) 500 mg in sodium chloride 0.9 % 250 mL IVPB  Status:  Discontinued     500 mg 250 mL/hr over 60 Minutes Intravenous Every 24 hours 05/05/18 0837 05/08/18 1154   05/05/18 0845  cefTRIAXone (ROCEPHIN) 1 g in sodium chloride 0.9 % 100 mL IVPB  Status:  Discontinued     1 g 200 mL/hr over 30 Minutes Intravenous NOW 05/05/18 0840 05/05/18 0841   05/05/18 0645  vancomycin (VANCOCIN) 1,500 mg in sodium chloride 0.9 % 500 mL IVPB     1,500 mg  250 mL/hr over 120 Minutes Intravenous  Once 05/05/18 0643 05/05/18 1055   05/05/18 0630  ceFEPIme (MAXIPIME) 1 g in sodium chloride 0.9 % 100 mL IVPB     1 g 200 mL/hr over 30 Minutes Intravenous  Once 05/05/18 0621 05/05/18 0825     Subjective: Seen and and examined at bedside and states came very "hot and short of breath last night in the middle of the night after eating."  States that happens occasionally after he eats sometimes.  It was concerning to him and it went away.  No lightheadedness or dizziness.  Denies any shortness of breath or chest pain now.  States that his ears are still blocked and wanting him to be flushed.  No other concerns or complaints at this time and cardiology is involved managing and adjusting medications.  Objective: Vitals:   05/07/18 1636 05/07/18 2319 05/08/18 0726 05/08/18 1637  BP: 130/84 105/74 124/79 118/72  Pulse: 86 89 72 93  Resp: 16 18 16 18   Temp: (!) 97.3 F (  36.3 C) 97.7 F (36.5 C)  97.8 F (36.6 C)  TempSrc: Oral Oral  Oral  SpO2: 100% 96% 96% 96%  Weight:      Height:        Intake/Output Summary (Last 24 hours) at 05/08/2018 1640 Last data filed at 05/07/2018 2030 Gross per 24 hour  Intake 240 ml  Output -  Net 240 ml   Filed Weights   05/05/18 2358  Weight: 76.7 kg   Examination: Physical Exam:  Constitutional: Well-nourished, well-developed African-American male currently no acute distress appears calm and comfortable laying in bed and states that he is feeling much better respiratory wise Eyes: Lids and conjunctive are normal.  Sclera anicteric ENMT: External ears and nose appear normal.  Patient is hard of hearing.  Mucous members are moist Neck: Appears supple no JVD Respiratory: Diminished auscultation bilaterally no  appreciable wheezing, rales, rhonchi.  Does have some coarse sounding breaths and slight crackles which is improved.  Has a normal respiratory effort is not tachypneic but he still wearing supplemental  oxygen via nasal cannula at 2 L and I have asked the nurse to wean Cardiovascular: Regular rate and rhythm.  No appreciable murmurs, rubs, gallops.  Has 1+ lower extremity edema noted Abdomen: Soft, nontender, nondistended.  Bowel sounds present GU: Deferred Musculoskeletal: No clubbing or cyanosis.  No joint deformities upper extremities noted Skin: Skin is warm and dry no appreciable rashes or lesions limited skin evaluation Neurologic: Cranial nerves II through XII grossly intact no appreciable focal deficits. Psychiatric: Normal judgment and insight.  Patient is awake, alert and oriented x3.  Pleasant mood and affect  Data Reviewed: I have personally reviewed following labs and imaging studies  CBC: Recent Labs  Lab 05/05/18 0343 05/05/18 0503 05/06/18 0252 05/07/18 0336 05/08/18 0256  WBC 12.9*  --  12.2* 11.6* 8.9  NEUTROABS  --   --   --  10.0* 7.1  HGB 10.6* 11.2* 9.0* 9.3* 9.1*  HCT 35.4* 33.0* 28.8* 29.3* 28.5*  MCV 109.3*  --  103.6* 103.5* 104.4*  PLT 211  --  174 174 371   Basic Metabolic Panel: Recent Labs  Lab 05/05/18 0343 05/05/18 0502 05/05/18 0503 05/06/18 0252 05/07/18 0336 05/08/18 0256  NA 142  --  143 139 141 141  K 3.6  --  3.9 4.2 3.9 3.9  CL 109  --   --  110 108 108  CO2 25  --   --  21* 25 26  GLUCOSE 158*  --   --  119* 100* 99  BUN 17  --   --  20 25* 28*  CREATININE 2.12* 2.10*  --  1.88* 1.90* 2.13*  CALCIUM 8.5*  --   --  8.2* 8.6* 8.5*  MG  --   --   --   --  1.4* 1.9  PHOS  --   --   --   --  2.7 3.0   GFR: Estimated Creatinine Clearance: 33.5 mL/min (A) (by C-G formula based on SCr of 2.13 mg/dL (H)). Liver Function Tests: Recent Labs  Lab 05/07/18 0336 05/08/18 0256  AST 10* 12*  ALT 8 7  ALKPHOS 53 47  BILITOT 0.5 0.6  PROT 6.0* 5.8*  ALBUMIN 2.6* 2.5*   No results for input(s): LIPASE, AMYLASE in the last 168 hours. No results for input(s): AMMONIA in the last 168 hours. Coagulation Profile: No results for input(s):  INR, PROTIME in the last 168 hours. Cardiac Enzymes: Recent Labs  Lab 05/05/18 0343  TROPONINI <0.03   BNP (last 3 results) No results for input(s): PROBNP in the last 8760 hours. HbA1C: No results for input(s): HGBA1C in the last 72 hours. CBG: No results for input(s): GLUCAP in the last 168 hours. Lipid Profile: No results for input(s): CHOL, HDL, LDLCALC, TRIG, CHOLHDL, LDLDIRECT in the last 72 hours. Thyroid Function Tests: Recent Labs    05/07/18 0336 05/08/18 0904  TSH 0.202*  --   FREET4  --  0.81*   Anemia Panel: No results for input(s): VITAMINB12, FOLATE, FERRITIN, TIBC, IRON, RETICCTPCT in the last 72 hours. Sepsis Labs: Recent Labs  Lab 05/05/18 0834  05/05/18 1548 05/05/18 1841 05/05/18 2228 05/06/18 0252  PROCALCITON <0.10  --   --   --   --   --   LATICACIDVEN  --    < > 3.3* 3.8* 3.5* 2.5*   < > = values in this interval not displayed.    Recent Results (from the past 240 hour(s))  Blood culture (routine x 2)     Status: None (Preliminary result)   Collection Time: 05/05/18  8:07 AM  Result Value Ref Range Status   Specimen Description BLOOD RIGHT HAND  Final   Special Requests   Final    BOTTLES DRAWN AEROBIC AND ANAEROBIC Blood Culture adequate volume   Culture   Final    NO GROWTH 3 DAYS Performed at [facility], [address], [place], [place] [address]    Report Status PENDING  Incomplete  Blood culture (routine x 2)     Status: None (Preliminary result)   Collection Time: 05/05/18  8:15 AM  Result Value Ref Range Status   Specimen Description BLOOD LEFT HAND  Final   Special Requests   Final    BOTTLES DRAWN AEROBIC AND ANAEROBIC Blood Culture adequate volume   Culture   Final    NO GROWTH 3 DAYS Performed at [facility], [place] [address] [place],  [address]    Report Status PENDING  Incomplete    Radiology Studies: Dg Chest Port 1 View  Result Date: 05/08/2018 CLINICAL DATA:  Shortness of breath, pneumonia  EXAM: PORTABLE CHEST 1 VIEW COMPARISON:  05/07/2018 FINDINGS: Mild left basilar opacity, favoring atelectasis. Right lung is clear. No frank interstitial edema. No pleural effusion or pneumothorax. The heart is normal in size. IMPRESSION: Mild left basilar opacity, favoring atelectasis. Electronically Signed   By: Julian Hy M.D.   On: 05/08/2018 07:43   Dg Chest Port 1 View  Result Date: 05/07/2018 CLINICAL DATA:  73 year old male with shortness of breath EXAM: PORTABLE CHEST 1 VIEW COMPARISON:  Prior chest x-ray 05/05/2018 FINDINGS: Cardiomegaly, stable compared to prior imaging. Mediastinal contours are within normal limits. No evidence of pulmonary edema, pleural effusion, pneumothorax or focal airspace consolidation. Slight interval improvement of perihilar interstitial infiltrates suggest resolution of mild edema. Otherwise, the background bronchitic changes are similar. Trace bilateral pleural effusions and bibasilar atelectasis. No acute osseous abnormality. IMPRESSION: 1. Improving perihilar interstitial airspace opacities favors resolved interstitial edema. 2. Stable trace bilateral pleural effusions and bibasilar atelectasis. 3. Stable cardiomegaly. Electronically Signed   By: Jacqulynn Cadet M.D.   On: 05/07/2018 08:57   Scheduled Meds: . allopurinol  200 mg Oral Daily  . [START ON 05/09/2018] azithromycin  500 mg Oral Daily  . carbamide peroxide  5 drop Right EAR BID  . divalproex  1,500 mg Oral QHS  . enoxaparin (LOVENOX) injection  40  mg Subcutaneous Q24H  . [START ON 05/31/2018] epoetin alfa  10,000 Units Subcutaneous Q28 days  . ferrous sulfate  325 mg Oral BID  . hydrALAZINE  25 mg Oral Q8H  . hydrocortisone sod succinate (SOLU-CORTEF) inj  50 mg Intravenous Q12H  . isosorbide mononitrate  30 mg Oral Daily  . magnesium oxide  800 mg Oral TID  . metoprolol succinate  50 mg Oral Daily  . mirabegron ER  25 mg Oral Daily  . OLANZapine  7.5 mg Oral QHS  . pantoprazole  40 mg  Oral Daily  . prazosin  5 mg Oral QHS  . sodium chloride flush  3 mL Intravenous Q12H   Continuous Infusions: . cefTRIAXone (ROCEPHIN)  IV Stopped (05/08/18 1515)    LOS: 3 days    Kerney Elbe, DO [facility] PAGER is on [place]  If 7PM-7AM, please contact night-coverage [email or web address] Password [facility] 05/08/2018, 4:40 PM

## 2018-05-09 ENCOUNTER — Inpatient Hospital Stay (HOSPITAL_COMMUNITY): Payer: Medicare Other

## 2018-05-09 DIAGNOSIS — I472 Ventricular tachycardia: Secondary | ICD-10-CM

## 2018-05-09 LAB — CBC WITH DIFFERENTIAL/PLATELET
Abs Immature Granulocytes: 0.04 10*3/uL (ref 0.00–0.07)
Basophils Absolute: 0 10*3/uL (ref 0.0–0.1)
Basophils Relative: 0 %
Eosinophils Absolute: 0.1 10*3/uL (ref 0.0–0.5)
Eosinophils Relative: 1 %
HCT: 28.3 % — ABNORMAL LOW (ref 39.0–52.0)
Hemoglobin: 8.8 g/dL — ABNORMAL LOW (ref 13.0–17.0)
Immature Granulocytes: 1 %
LYMPHS ABS: 2.7 10*3/uL (ref 0.7–4.0)
Lymphocytes Relative: 31 %
MCH: 33.2 pg (ref 26.0–34.0)
MCHC: 31.1 g/dL (ref 30.0–36.0)
MCV: 106.8 fL — ABNORMAL HIGH (ref 80.0–100.0)
Monocytes Absolute: 0.8 10*3/uL (ref 0.1–1.0)
Monocytes Relative: 9 %
Neutro Abs: 5 10*3/uL (ref 1.7–7.7)
Neutrophils Relative %: 58 %
Platelets: 156 10*3/uL (ref 150–400)
RBC: 2.65 MIL/uL — ABNORMAL LOW (ref 4.22–5.81)
RDW: 13.9 % (ref 11.5–15.5)
WBC: 8.6 10*3/uL (ref 4.0–10.5)
nRBC: 0 % (ref 0.0–0.2)

## 2018-05-09 LAB — COMPREHENSIVE METABOLIC PANEL
ALT: 8 U/L (ref 0–44)
AST: 11 U/L — ABNORMAL LOW (ref 15–41)
Albumin: 2.5 g/dL — ABNORMAL LOW (ref 3.5–5.0)
Alkaline Phosphatase: 50 U/L (ref 38–126)
Anion gap: 4 — ABNORMAL LOW (ref 5–15)
BILIRUBIN TOTAL: 0.4 mg/dL (ref 0.3–1.2)
BUN: 26 mg/dL — ABNORMAL HIGH (ref 8–23)
CO2: 27 mmol/L (ref 22–32)
Calcium: 8.1 mg/dL — ABNORMAL LOW (ref 8.9–10.3)
Chloride: 112 mmol/L — ABNORMAL HIGH (ref 98–111)
Creatinine, Ser: 1.86 mg/dL — ABNORMAL HIGH (ref 0.61–1.24)
GFR calc Af Amer: 41 mL/min — ABNORMAL LOW (ref >60)
GFR calc non Af Amer: 35 mL/min — ABNORMAL LOW (ref >60)
Glucose, Bld: 95 mg/dL (ref 70–99)
POTASSIUM: 3.9 mmol/L (ref 3.5–5.1)
Sodium: 143 mmol/L (ref 135–145)
Total Protein: 5.4 g/dL — ABNORMAL LOW (ref 6.5–8.1)

## 2018-05-09 LAB — IRON AND TIBC
Iron: 34 ug/dL — ABNORMAL LOW (ref 45–182)
Saturation Ratios: 18 % (ref 17.9–39.5)
TIBC: 192 ug/dL — ABNORMAL LOW (ref 250–450)
UIBC: 158 ug/dL

## 2018-05-09 LAB — RETICULOCYTES
Immature Retic Fract: 13 % (ref 2.3–15.9)
RBC.: 2.65 MIL/uL — ABNORMAL LOW (ref 4.22–5.81)
RETIC CT PCT: 2.4 % (ref 0.4–3.1)
Retic Count, Absolute: 62.5 10*3/uL (ref 19.0–186.0)

## 2018-05-09 LAB — FOLATE: Folate: 3.8 ng/mL — ABNORMAL LOW (ref >5.9)

## 2018-05-09 LAB — FERRITIN: FERRITIN: 360 ng/mL — AB (ref 24–336)

## 2018-05-09 LAB — MAGNESIUM: Magnesium: 1.8 mg/dL (ref 1.7–2.4)

## 2018-05-09 LAB — T3: T3, Total: 58 ng/dL — ABNORMAL LOW (ref 71–180)

## 2018-05-09 LAB — PHOSPHORUS: Phosphorus: 3.1 mg/dL (ref 2.5–4.6)

## 2018-05-09 LAB — VITAMIN B12: Vitamin B-12: 208 pg/mL (ref 180–914)

## 2018-05-09 MED ORDER — LOPERAMIDE HCL 2 MG PO CAPS
4.0000 mg | ORAL_CAPSULE | Freq: Once | ORAL | Status: AC
  Administered 2018-05-09: 4 mg via ORAL
  Filled 2018-05-09: qty 2

## 2018-05-09 NOTE — Evaluation (Signed)
Physical Therapy Evaluation Patient Details Name: Brent [name] MRN: [id number] DOB: [birth year] Today's Date: 05/09/2018   History of Present Illness    73 y.o.malewith medical history significant ofCrohn's; celiac disease; HTN; COPD with remote tobacco dependence; and bipolar d/o presenting with SOB.   Clinical Impression  Pt presents at/near baseline independence with basic mobility.  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  Could benefit from OPPT to address and sharpen balance function, but not required for d/c.  No other needs.      Follow Up Recommendations No PT follow up    Equipment Recommendations  None recommended by PT    Recommendations for Other Services       Precautions / Restrictions Precautions Precautions: Fall Precaution Comments: up with supervision      Mobility  Bed Mobility Overal bed mobility: Independent                Transfers Overall transfer level: Independent                  Ambulation/Gait Ambulation/Gait assistance: Modified independent (Device/Increase time) Gait Distance (Feet): 300 Feet Assistive device: None Gait Pattern/deviations: Step-through pattern;Decreased stride length;Narrow base of support;Trunk flexed     General Gait Details: pt able to assume tall posture with cues; recommended/instructed pt use RW to advance confidence with gait/stride  Stairs            Wheelchair Mobility    Modified Rankin (Stroke Patients Only)       Balance Overall balance assessment: History of Falls;Mild deficits observed, not formally tested                                           Pertinent Vitals/Pain Pain Assessment: No/denies pain    Home Living Family/patient expects to be discharged to:: Private residence Living Arrangements: Alone Available Help at Discharge: Friend(s);Available  PRN/intermittently Type of Home: Apartment Home Access: Stairs to enter Entrance Stairs-Rails: None Entrance Stairs-Number of Steps: 1 Home Layout: One level Home Equipment: Walker - 2 wheels;Cane - single point      Prior Function Level of Independence: Independent with assistive device(s)         Comments: stays active by visiting SNFs, asked how he will modify with national restrictions due to virus     Hand Dominance   Dominant Hand: Right    Extremity/Trunk Assessment   Upper Extremity Assessment Upper Extremity Assessment: Overall WFL for tasks assessed    Lower Extremity Assessment Lower Extremity Assessment: Overall WFL for tasks assessed    Cervical / Trunk Assessment Cervical / Trunk Assessment: Normal  Communication   Communication: No difficulties  Cognition Arousal/Alertness: Awake/alert Behavior During Therapy: WFL for tasks assessed/performed Overall Cognitive Status: Within Functional Limits for tasks assessed                                        General Comments General comments (skin integrity, edema, etc.): did Rhomberg EO/EC with no LOB, unable to stand one leg unasssited but does hold X20 sec with support    Exercises     Assessment/Plan    PT Assessment Patent does not need any further PT services  PT Problem List  PT Treatment Interventions      PT Goals (Current goals can be found in the Care Plan section)  Acute Rehab PT Goals Patient Stated Goal: home PT Goal Formulation: All assessment and education complete, DC therapy    Frequency     Barriers to discharge        Co-evaluation               AM-PAC PT "6 Clicks" Mobility  Outcome Measure Help needed turning from your back to your side while in a flat bed without using bedrails?: None Help needed moving from lying on your back to sitting on the side of a flat bed without using bedrails?: None Help needed moving to and from a bed to a  chair (including a wheelchair)?: None Help needed standing up from a chair using your arms (e.g., wheelchair or bedside chair)?: None Help needed to walk in hospital room?: None Help needed climbing 3-5 steps with a railing? : None 6 Click Score: 24    End of Session   Activity Tolerance: Patient tolerated treatment well Patient left: in bed;with call bell/phone within reach Nurse Communication: Mobility status PT Visit Diagnosis: Unsteadiness on feet (R26.81)    Time: 3762-8315 PT Time Calculation (min) (ACUTE ONLY): 29 min   Charges:   PT Evaluation $PT Eval Low Complexity: 1 Low PT Treatments $Gait Training: 8-22 mins $Therapeutic Activity: 8-22 mins       Kearney Hard, PT, DPT, MS Board Certified Geriatric Clinical Specialist  [name] 05/09/2018, 4:09 PM

## 2018-05-09 NOTE — Plan of Care (Signed)

## 2018-05-09 NOTE — Plan of Care (Signed)

## 2018-05-09 NOTE — Progress Notes (Signed)
PROGRESS NOTE    KHALIK PEWITT  VOJ:[id number] DOB: [birth year] DOA: 05/05/2018 PCP: Gayland Curry, DO   Brief Narrative:  HPI per Dr. Karmen Bongo on 05/05/2018 STEVENS MAGWOOD is a 73 y.o. male with medical history significant of Crohn's; celiac disease; HTN; COPD with remote tobacco dependence; and bipolar d/o presenting with SOB.  About 0200 this AM, he woke up and was wet with sweat and couldn't breathe.  He was wheezing and extremely SOB.  He was totally fine last night when he went to bed.  He has not been coughing.  No URI symptoms, fever, etc prior to going to bed last night.  He feels markedly better now.  They gave him medications with EMS but continued to have respiratory distress until he was in the ER.  He is currently off BIPAP and feeling much better.  **Interim History He states he is feeling better but stopped IVF and gave another dose of IV Lasix the day before yesterday and one yesterday AM but none today given Slight bump in Cr. ECHO showed Systolic and Diastolic CHF so Cardiology was consulted for further evaluation and they adjusted medications and stopped Amlodipine.They recommended holding Lasix yesterday and evaluating and patient is tolerating Isosorbide-Hydralazine combination. He was able to be weaned off of Supplemental O2 today  PT to evaluate the patient still. Overnight the patient had some Non-sustained polymorphic VTach so Cardiology wants to do an inpatient ischemia evaluation.   Assessment & Plan:   Principal Problem:   Acute respiratory failure with hypoxia (HCC) Active Problems:   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HCC)   Chronic kidney disease (CKD), stage III (moderate) (HCC)   Anemia in chronic kidney disease   Essential hypertension, benign   Bipolar 1 disorder (HCC)   Multifocal pneumonia   Flash pulmonary edema (HCC)   Acute systolic CHF (congestive heart failure) (HCC)  Acute Respiratory Failure, improved and now on Room Air  -Patient without known  respiratory compromise presenting with acute onset of respiratory failure -Marked improvement with BIPAP, now off BIPAP and weaned off of Supplemental O2.  -Suspect this was related to a combination of PNA and flash pulmonary edema in the setting of CHF -Continue Supplemental O2 via [place] and Wean O2 as tolerated; Now down to 2 Liters yesterday and is still on 2 Liters and will attempt to Wean -Continuous Pulse Oximetry and Maintain O2 Saturations >90% -Repeat CXR this AM showed "Mild lingular and bibasilar opacities, likely atelectasis, less likely mild infection." -Continue Treatment as Below -Will need Ambulatory Home O2 Screen Prior to D/C and did not Desaturate  -Will need PT Evaluation and this is pending   Multifocal PNA with Sepsis Physiology -SIRS criteria in this patient includes: Leukocytosis, tachycardia, tachypnea, and hypoxia  -Patient has evidence of acute organ failure with elevated lactate  -While awaiting blood cultures, this appears to be a preseptic condition -Sepsis protocol initiated; Sepsis Physiology is now much improved  -Suspected source is PNA - which correlates with respiratory compromise and apparent multifocal infiltrates appreciated on both CXR and chest CT -CURB-65 score is 1 and he was admitted  -Pneumonia Severity Index (PSI) is Class 4, 9% mortality. -Corticosteroids have been to shown to low overall mortality rate; risk of ARDS; and need for mechanical ventilation.  This is particularly true in severe PNA (class 3+ PSI).  Will add stress-dosed steroids since this patient is immunocompromised due to Remicade infusions and he is now on Hydrocortisone 50 mg q12h and will wean  further to 50 mg q24h and stop after today's dose -Started Azithromycin 500 mg daily AND Rocephin due to no risk factors for MDR cause and will continue for now -Additional complicating factors include: hypoxia/hypoxemia; and immunocompromise. As a result, will admit the patient to the  hospital. -IVF has now been discontinued  -Fever control with Acetaminophen -Repeat CBC in am -WBC went from 12.9 -> 12.2 -> 11.6 -> 8.9 -> 8.6 -Sputum cultures never done  -Blood cultures x2 Showed NGTD at 4 Days -Will order lower respiratory tract procalcitonin level and was <0.10 and will stop Abx this AM -Will trend lactate to ensure improvement and went from 4.0 -> 2.5 -Repeat CXR as above  Flash pulmonary edema in the setting of Acute Systolic and Diastolic CHF, improved  -Patient without significant respiratory symptoms prior to acute respiratory episode. -Suspect that he was developing PNA and that this led to R heart strain with resultant flash pulmonary edema. -This improved with BIPAP and he is now stable from a respiratory standpoint.   -Will continue [place] O2 if necessary but this has been weaned to Room Air -S/p IV Lasix and now stopped -BNP was elevated at 490.0; this may be related to CKD but he does not have another known baseline. -ECHOCardiogram showing Systolic and Diastolic Dysfunction and LV Diffuse Hypokinesis  -Strict I's/O's and Daily Weights and Fluid Restrict to 1500 mL; Weight was 169 on admission and he is down 6 lbs to 163 -Patient is +1.152 Liters since admission -Continue to Monitor for Volume Overload  -Cardiology consulted for CHF and recommending holding diuretics and outpatient ECHO in 8-12 weeks but because of his polymorphic nonsustained VTACH he will undergo ischemic evaluation per Cards  Crohn's Disease -He receives Remicade infusions and so is immunocompromised. -Currently without concern for flare. -On Stress Dose Steroids -Closely Monitor   Stage 3 CKD  -Slightly worsened  -BUN/Cr went from 17/2.10 -> 20/1.88 -> 25/1.90 -> 28/2.13 -> 26/1.86 -Holding IV Lasix again   -Avoid Nephrotoxic Medications and Contrast Dyes if possible -Repeat CMP in AM   Anemia of CKD/Macrocytic Anemia -On Procrit injections monthly. -Patient's Hb/Hct went from  11.2/33.0 -> 9.0/28.8 and ? Dilutional Drop; Repeat this AM was 8.8/28.3 -Checked Anemia Panel and showed level of 34, U IBC 158, TIBC 192, saturation ratios 18%, ferritin level 360, folate level 3.9, and vitamin B12 level 209 -Continue to Monitor for S/Sx of Bleeding -Repeat CBC In AM   HTN -Amlodipine 2.5 mg po Daily now discontinued -C/w Toprol XL 50 mg po Daily -Cardiology Started Isosorbide Mononitrate 30 mg po Daily and Hydralazine 25 mg po q8h -Cardiologyrecommending continuing these and optimizing BP control  Bipolar -Continue Depakote 1500 mg po qHS (Low at 18), Zyprexa 7.5 mg po qHS, and Prazosin 5 mg po qHS.  Right Ear Blockage -Will order Debrox -Now complaining of Left Ear also so will order Debrox for that as well  Hypomagnesemia, improved -Patient's Mag Level this AM was 1.8 -Will continue Home Mag Oxide 800 mg po TID -Continue to Monitor and Replete as Necessary -Repeat Mag Level in the AM   Nonsustained polymorphic V Tach -Had a 4 beat run -Cardiology concerned and want to do an ischemic evaluation while patient is inpatient given his new reduced EF -Cardiology to set up a Lexiscan Myoview in the a.m.  DVT prophylaxis: Enoxaparin 40 mg sq q24h Code Status: FULL CODE Family Communication: No family present at bedside  Disposition Plan: Remain Inpatient for further evaluation and treatment  and possible D/C in next 24-48 hours pending Cardiac Clearance   Consultants:   Cardiology   Procedures: ECHOCARDIOGRAM IMPRESSIONS    1. The left ventricle has moderate-severely reduced systolic function, with an ejection fraction of 30-35%. The cavity size was moderate to severely dilated. There is mild concentric left ventricular hypertrophy. Left ventricular diastolic Doppler  parameters are consistent with impaired relaxation. Left ventricular diffuse hypokinesis.  2. The right ventricle has normal systolic function. The cavity was normal. There is no increase in  right ventricular wall thickness.  3. The aortic valve is tricuspid Aortic valve regurgitation is trivial by color flow Doppler.  4. The inferior vena cava was normal in size with <50% respiratory variability.  FINDINGS  Left Ventricle: The left ventricle has moderate-severely reduced systolic function, with an ejection fraction of 30-35%. The cavity size was moderate to severely dilated. There is mild concentric left ventricular hypertrophy. Left ventricular diastolic  Doppler parameters are consistent with impaired relaxation. Left ventricular diffuse hypokinesis. Right Ventricle: The right ventricle has normal systolic function. The cavity was normal. There is no increase in right ventricular wall thickness. Left Atrium: left atrial size was normal in size Right Atrium: right atrial size was normal in size. Right atrial pressure is estimated at 8 mmHg. Interatrial Septum: No atrial level shunt detected by color flow Doppler. Pericardium: There is no evidence of pericardial effusion. Mitral Valve: The mitral valve is normal in structure. Mitral valve regurgitation is mild by color flow Doppler. Tricuspid Valve: The tricuspid valve is normal in structure. Tricuspid valve regurgitation is trivial by color flow Doppler. Aortic Valve: The aortic valve is tricuspid Aortic valve regurgitation is trivial by color flow Doppler. There is no evidence of aortic valve stenosis. Pulmonic Valve: The pulmonic valve was grossly normal. Pulmonic valve regurgitation is not visualized by color flow Doppler. Pulmonary Artery: The pulmonary artery is of normal size and origin. Venous: The inferior vena cava is normal in size with less than 50% respiratory variability.   LEFT VENTRICLE PLAX 2D LVIDd:         6.30 cm       Diastology LVIDs:         5.40 cm       LV e' lateral:   6.53 cm/s LV PW:         1.20 cm       LV E/e' lateral: 14.7 LV IVS:        1.30 cm       LV e' medial:    7.94 cm/s LVOT diam:      2.50 cm       LV E/e' medial:  12.1 LV SV:         60 ml LV SV Index:   30.32 LVOT Area:     4.91 cm   LV Volumes (MOD) LV area d, A4C:    50.40 cm LV area s, A4C:    38.10 cm LV major d, A4C:   8.95 cm LV major s, A4C:   8.45 cm LV vol d, MOD A4C: 234.0 ml LV vol s, MOD A4C: 142.0 ml LV SV MOD A4C:     234.0 ml  RIGHT VENTRICLE RV Basal diam:  3.30 cm RV S prime:     19.60 cm/s TAPSE (M-mode): 2.8 cm RVSP:           21.5 mmHg  LEFT ATRIUM             Index  RIGHT ATRIUM           Index LA diam:        3.90 cm 1.92 cm/m  RA Pressure: 8 mmHg LA Vol (A2C):   37.4 ml 18.44 ml/m RA Area:     14.60 cm LA Vol (A4C):   54.9 ml 27.06 ml/m RA Volume:   34.30 ml  16.91 ml/m LA Biplane Vol: 49.5 ml 24.40 ml/m  AORTIC VALVE LVOT Vmax:   102.00 cm/s LVOT Vmean:  70.200 cm/s LVOT VTI:    0.189 m AR PHT:      599 msec   AORTA Ao Root diam: 3.80 cm  MITRAL VALVE               TRICUSPID VALVE MV Area (PHT): 22.31 cm   TR Peak grad:   13.5 mmHg MV PHT:        9.86 msec   TR Vmax:        218.00 cm/s MV Decel Time: 34 msec     RVSP:           21.5 mmHg MV E velocity: 96.00 cm/s MV A velocity: 135.00 cm/s SHUNTS MV E/A ratio:  0.71        Systemic VTI:  0.19 m                            Systemic Diam: 2.50 cm    Antimicrobials:  Anti-infectives (From admission, onward)   Start     Dose/Rate Route Frequency Ordered Stop   05/09/18 1000  azithromycin (ZITHROMAX) tablet 500 mg     500 mg Oral Daily 05/08/18 1154     05/06/18 0800  cefTRIAXone (ROCEPHIN) 2 g in sodium chloride 0.9 % 100 mL IVPB  Status:  Discontinued     2 g 200 mL/hr over 30 Minutes Intravenous Every 24 hours 05/05/18 0837 05/05/18 0843   05/06/18 0700  cefTRIAXone (ROCEPHIN) 2 g in sodium chloride 0.9 % 100 mL IVPB     2 g 200 mL/hr over 30 Minutes Intravenous Every 24 hours 05/05/18 0843     05/05/18 0845  azithromycin (ZITHROMAX) 500 mg in sodium chloride 0.9 % 250 mL IVPB  Status:  Discontinued      500 mg 250 mL/hr over 60 Minutes Intravenous Every 24 hours 05/05/18 0837 05/08/18 1154   05/05/18 0845  cefTRIAXone (ROCEPHIN) 1 g in sodium chloride 0.9 % 100 mL IVPB  Status:  Discontinued     1 g 200 mL/hr over 30 Minutes Intravenous NOW 05/05/18 0840 05/05/18 0841   05/05/18 0645  vancomycin (VANCOCIN) 1,500 mg in sodium chloride 0.9 % 500 mL IVPB     1,500 mg 250 mL/hr over 120 Minutes Intravenous  Once 05/05/18 0643 05/05/18 1055   05/05/18 0630  ceFEPIme (MAXIPIME) 1 g in sodium chloride 0.9 % 100 mL IVPB     1 g 200 mL/hr over 30 Minutes Intravenous  Once 05/05/18 0370 05/05/18 0825     Subjective: Seen and and examined at bedside and says that he was feeling great and hopeful to get off oxygen.  No nausea or vomiting.  States that he slept well but Being disturbed because of people coming into his room.  Cardiology discussed with him about ischemic evaluation and this will be planned for tomorrow.  No chest pain, lightheadedness or dizziness.  No shortness of breath today.  Feels that his legs have improved with the swelling.  Objective: Vitals:   05/08/18 1637 05/09/18 0600 05/09/18 0800 05/09/18 1100  BP: 118/72  127/82   Pulse: 93  95   Resp: 18  18   Temp: 97.8 F (36.6 C)  (!) 97.5 F (36.4 C)   TempSrc: Oral  Oral   SpO2: 96%  99% 98%  Weight:  74.1 kg    Height:        Intake/Output Summary (Last 24 hours) at 05/09/2018 1430 Last data filed at 05/09/2018 1191 Gross per 24 hour  Intake 120 ml  Output 300 ml  Net -180 ml   Filed Weights   05/05/18 2358 05/09/18 0600  Weight: 76.7 kg 74.1 kg   Examination: Physical Exam:  Constitutional: Well-nourished, well-developed African-American male currently no acute distress peers calm and comfortable laying in bed and has improved significantly from a breathing standpoint Eyes: Lids and conjunctive are normal.  Sclera anicteric ENMT: External ears and nose appear normal.  Mucous members are moist Neck: Appears  supple no JVD Respiratory: Diminished auscultation bilaterally no appreciable wheezing, rales, rhonchi.  Did have some coarse sounding breath sounds and very mild scattered crackles which is much improved.  Had normal respiratory effort and he was wearing supplemental oxygen via nasal cannula 2 L but asked the nurse to remove this and he maintained his saturations Cardiovascular: Regular rate and rhythm.  No appreciable murmurs, rubs, gallops.  Has trace to 1+ lower extremity edema noted bilaterally  Abdomen: Soft, nontender, nondistended.  Bowel sounds present GU: Deferred Musculoskeletal: No contractures or cyanosis.  No joint deformities in the upper or lower extremities noted Skin: Skin is warm dry no appreciable rashes or lesions on limited skin evaluation Neurologic: Cranial nerves II through XII gross intact no appreciable focal deficits Psychiatric: Normal judgment and insight.  Patient is awake, alert and oriented x3.  Pleasant mood and affect  Data Reviewed: I have personally reviewed following labs and imaging studies  CBC: Recent Labs  Lab 05/05/18 0343 05/05/18 0503 05/06/18 0252 05/07/18 0336 05/08/18 0256 05/09/18 0251  WBC 12.9*  --  12.2* 11.6* 8.9 8.6  NEUTROABS  --   --   --  10.0* 7.1 5.0  HGB 10.6* 11.2* 9.0* 9.3* 9.1* 8.8*  HCT 35.4* 33.0* 28.8* 29.3* 28.5* 28.3*  MCV 109.3*  --  103.6* 103.5* 104.4* 106.8*  PLT 211  --  174 174 159 478   Basic Metabolic Panel: Recent Labs  Lab 05/05/18 0343 05/05/18 0502 05/05/18 0503 05/06/18 0252 05/07/18 0336 05/08/18 0256 05/09/18 0251  NA 142  --  143 139 141 141 143  K 3.6  --  3.9 4.2 3.9 3.9 3.9  CL 109  --   --  110 108 108 112*  CO2 25  --   --  21* 25 26 27   GLUCOSE 158*  --   --  119* 100* 99 95  BUN 17  --   --  20 25* 28* 26*  CREATININE 2.12* 2.10*  --  1.88* 1.90* 2.13* 1.86*  CALCIUM 8.5*  --   --  8.2* 8.6* 8.5* 8.1*  MG  --   --   --   --  1.4* 1.9 1.8  PHOS  --   --   --   --  2.7 3.0 3.1    GFR: Estimated Creatinine Clearance: 37.1 mL/min (A) (by C-G formula based on SCr of 1.86 mg/dL (H)). Liver Function Tests: Recent Labs  Lab 05/07/18 0336 05/08/18 0256 05/09/18 0251  AST  10* 12* 11*  ALT 8 7 8   ALKPHOS 53 47 50  BILITOT 0.5 0.6 0.4  PROT 6.0* 5.8* 5.4*  ALBUMIN 2.6* 2.5* 2.5*   No results for input(s): LIPASE, AMYLASE in the last 168 hours. No results for input(s): AMMONIA in the last 168 hours. Coagulation Profile: No results for input(s): INR, PROTIME in the last 168 hours. Cardiac Enzymes: Recent Labs  Lab 05/05/18 0343  TROPONINI <0.03   BNP (last 3 results) No results for input(s): PROBNP in the last 8760 hours. HbA1C: No results for input(s): HGBA1C in the last 72 hours. CBG: No results for input(s): GLUCAP in the last 168 hours. Lipid Profile: No results for input(s): CHOL, HDL, LDLCALC, TRIG, CHOLHDL, LDLDIRECT in the last 72 hours. Thyroid Function Tests: Recent Labs    05/07/18 0336 05/08/18 0904  TSH 0.202*  --   FREET4  --  0.81*   Anemia Panel: Recent Labs    05/09/18 0251  VITAMINB12 208  FOLATE 3.8*  FERRITIN 360*  TIBC 192*  IRON 34*  RETICCTPCT 2.4   Sepsis Labs: Recent Labs  Lab 05/05/18 0834  05/05/18 1548 05/05/18 1841 05/05/18 2228 05/06/18 0252  PROCALCITON <0.10  --   --   --   --   --   LATICACIDVEN  --    < > 3.3* 3.8* 3.5* 2.5*   < > = values in this interval not displayed.    Recent Results (from the past 240 hour(s))  Blood culture (routine x 2)     Status: None (Preliminary result)   Collection Time: 05/05/18  8:07 AM  Result Value Ref Range Status   Specimen Description BLOOD RIGHT HAND  Final   Special Requests   Final    BOTTLES DRAWN AEROBIC AND ANAEROBIC Blood Culture adequate volume   Culture   Final    NO GROWTH 4 DAYS Performed at [facility], [address] [place], [place] [address]    Report Status PENDING  Incomplete  Blood culture (routine x 2)     Status: None  (Preliminary result)   Collection Time: 05/05/18  8:15 AM  Result Value Ref Range Status   Specimen Description BLOOD LEFT HAND  Final   Special Requests   Final    BOTTLES DRAWN AEROBIC AND ANAEROBIC Blood Culture adequate volume   Culture   Final    NO GROWTH 4 DAYS Performed at [facility], [place] [address], [place], [place] [address]    Report Status PENDING  Incomplete    Radiology Studies: Dg Chest Port 1 View  Result Date: 05/09/2018 CLINICAL DATA:  Shortness of breath. History of post inflammatory pulmonary fibrosis. EXAM: PORTABLE CHEST 1 VIEW COMPARISON:  05/08/2018. FINDINGS: Mild lingular and bibasilar opacities, likely reflecting atelectasis, less likely mild infection. No frank interstitial edema. No pleural effusions or pneumothorax. The heart is normal in size. Mild degenerative changes of the mid thoracic spine. IMPRESSION: Mild lingular and bibasilar opacities, likely atelectasis, less likely mild infection. Electronically Signed   By: Julian Hy M.D.   On: 05/09/2018 08:35   Dg Chest Port 1 View  Result Date: 05/08/2018 CLINICAL DATA:  Shortness of breath, pneumonia EXAM: PORTABLE CHEST 1 VIEW COMPARISON:  05/07/2018 FINDINGS: Mild left basilar opacity, favoring atelectasis. Right lung is clear. No frank interstitial edema. No pleural effusion or pneumothorax. The heart is normal in size. IMPRESSION: Mild left basilar opacity, favoring atelectasis. Electronically Signed   By: Henderson Newcomer.D.  On: 05/08/2018 07:43   Scheduled Meds: . allopurinol  200 mg Oral Daily  . azithromycin  500 mg Oral Daily  . carbamide peroxide  5 drop Right EAR BID  . divalproex  1,500 mg Oral QHS  . enoxaparin (LOVENOX) injection  40 mg Subcutaneous Q24H  . [START ON 05/31/2018] epoetin alfa  10,000 Units Subcutaneous Q28 days  . ferrous sulfate  325 mg Oral BID  . hydrALAZINE  25 mg Oral Q8H  . hydrocortisone sod succinate (SOLU-CORTEF) inj  50 mg Intravenous Daily  .  isosorbide mononitrate  30 mg Oral Daily  . magnesium oxide  800 mg Oral TID  . metoprolol succinate  50 mg Oral Daily  . mirabegron ER  25 mg Oral Daily  . OLANZapine  7.5 mg Oral QHS  . pantoprazole  40 mg Oral Daily  . prazosin  5 mg Oral QHS  . sodium chloride flush  3 mL Intravenous Q12H   Continuous Infusions: . cefTRIAXone (ROCEPHIN)  IV 2 g (05/09/18 0620)    LOS: 4 days    Kerney Elbe, DO [facility] PAGER is on [place]  If 7PM-7AM, please contact night-coverage [email or web address] Password [email or web address] 05/09/2018, 2:30 PM

## 2018-05-09 NOTE — Progress Notes (Signed)
SATURATION QUALIFICATIONS:   Patient Saturations on Room Air at Rest = 95%  Patient Saturations on Room Air while Ambulating = 97%   

## 2018-05-10 ENCOUNTER — Inpatient Hospital Stay (HOSPITAL_COMMUNITY): Payer: Medicare Other

## 2018-05-10 DIAGNOSIS — I509 Heart failure, unspecified: Secondary | ICD-10-CM

## 2018-05-10 DIAGNOSIS — Z23 Encounter for immunization: Secondary | ICD-10-CM | POA: Diagnosis not present

## 2018-05-10 LAB — COMPREHENSIVE METABOLIC PANEL
ALT: 8 U/L (ref 0–44)
AST: 12 U/L — AB (ref 15–41)
Albumin: 2.4 g/dL — ABNORMAL LOW (ref 3.5–5.0)
Alkaline Phosphatase: 45 U/L (ref 38–126)
Anion gap: 5 (ref 5–15)
BUN: 20 mg/dL (ref 8–23)
CO2: 25 mmol/L (ref 22–32)
Calcium: 8.4 mg/dL — ABNORMAL LOW (ref 8.9–10.3)
Chloride: 112 mmol/L — ABNORMAL HIGH (ref 98–111)
Creatinine, Ser: 1.82 mg/dL — ABNORMAL HIGH (ref 0.61–1.24)
GFR calc Af Amer: 42 mL/min — ABNORMAL LOW (ref >60)
GFR calc non Af Amer: 36 mL/min — ABNORMAL LOW (ref >60)
Glucose, Bld: 86 mg/dL (ref 70–99)
POTASSIUM: 4.1 mmol/L (ref 3.5–5.1)
Sodium: 142 mmol/L (ref 135–145)
Total Bilirubin: 0.6 mg/dL (ref 0.3–1.2)
Total Protein: 5.4 g/dL — ABNORMAL LOW (ref 6.5–8.1)

## 2018-05-10 LAB — CBC WITH DIFFERENTIAL/PLATELET
Abs Immature Granulocytes: 0.02 10*3/uL (ref 0.00–0.07)
Basophils Absolute: 0 10*3/uL (ref 0.0–0.1)
Basophils Relative: 0 %
EOS ABS: 0.2 10*3/uL (ref 0.0–0.5)
Eosinophils Relative: 2 %
HCT: 29.4 % — ABNORMAL LOW (ref 39.0–52.0)
Hemoglobin: 9.1 g/dL — ABNORMAL LOW (ref 13.0–17.0)
Immature Granulocytes: 0 %
Lymphocytes Relative: 23 %
Lymphs Abs: 2.2 10*3/uL (ref 0.7–4.0)
MCH: 33.2 pg (ref 26.0–34.0)
MCHC: 31 g/dL (ref 30.0–36.0)
MCV: 107.3 fL — ABNORMAL HIGH (ref 80.0–100.0)
Monocytes Absolute: 0.9 10*3/uL (ref 0.1–1.0)
Monocytes Relative: 9 %
Neutro Abs: 6.1 10*3/uL (ref 1.7–7.7)
Neutrophils Relative %: 66 %
Platelets: 153 10*3/uL (ref 150–400)
RBC: 2.74 MIL/uL — ABNORMAL LOW (ref 4.22–5.81)
RDW: 13.9 % (ref 11.5–15.5)
WBC: 9.4 10*3/uL (ref 4.0–10.5)
nRBC: 0 % (ref 0.0–0.2)

## 2018-05-10 LAB — NM MYOCAR MULTI W/SPECT W/WALL MOTION / EF
CSEPPHR: 102 {beats}/min
Rest HR: 86 {beats}/min

## 2018-05-10 LAB — MAGNESIUM: MAGNESIUM: 1.8 mg/dL (ref 1.7–2.4)

## 2018-05-10 LAB — CULTURE, BLOOD (ROUTINE X 2)
Culture: NO GROWTH
Culture: NO GROWTH
Special Requests: ADEQUATE
Special Requests: ADEQUATE

## 2018-05-10 LAB — PHOSPHORUS: Phosphorus: 3 mg/dL (ref 2.5–4.6)

## 2018-05-10 MED ORDER — POLYETHYLENE GLYCOL 3350 17 G PO PACK: 17.0000 g | PACK | Freq: Every day | ORAL | 0 refills | Status: DC | PRN

## 2018-05-10 MED ORDER — CEFDINIR 300 MG PO CAPS: 300.0000 mg | ORAL_CAPSULE | Freq: Two times a day (BID) | ORAL | 0 refills | Status: DC

## 2018-05-10 MED ORDER — TECHNETIUM TC 99M TETROFOSMIN IV KIT
10.0000 | PACK | Freq: Once | INTRAVENOUS | Status: AC | PRN
  Administered 2018-05-10: 10 via INTRAVENOUS

## 2018-05-10 MED ORDER — SODIUM CHLORIDE 0.9 % IV SOLN
1.0000 g | INTRAVENOUS | Status: DC
  Administered 2018-05-10: 1 g via INTRAVENOUS
  Filled 2018-05-10: qty 10

## 2018-05-10 MED ORDER — TECHNETIUM TC 99M TETROFOSMIN IV KIT
30.0000 | PACK | Freq: Once | INTRAVENOUS | Status: AC | PRN
  Administered 2018-05-10: 30 via INTRAVENOUS

## 2018-05-10 MED ORDER — ISOSORBIDE MONONITRATE ER 30 MG PO TB24: 30.0000 mg | ORAL_TABLET | Freq: Every day | ORAL | 0 refills | Status: DC

## 2018-05-10 MED ORDER — SODIUM CHLORIDE 0.9 % IV SOLN
500.0000 mg | INTRAVENOUS | Status: DC
  Filled 2018-05-10: qty 500

## 2018-05-10 MED ORDER — CEFDINIR 300 MG PO CAPS: 300.0000 mg | ORAL_CAPSULE | Freq: Two times a day (BID) | ORAL | Status: DC

## 2018-05-10 MED ORDER — REGADENOSON 0.4 MG/5ML IV SOLN
0.4000 mg | Freq: Once | INTRAVENOUS | Status: AC
  Administered 2018-05-10: 0.4 mg via INTRAVENOUS
  Filled 2018-05-10: qty 5

## 2018-05-10 MED ORDER — SODIUM CHLORIDE 0.9 % IV SOLN
500.0000 mg | INTRAVENOUS | Status: DC
  Administered 2018-05-10: 500 mg via INTRAVENOUS
  Filled 2018-05-10: qty 500

## 2018-05-10 MED ORDER — HYDRALAZINE HCL 25 MG PO TABS: 25.0000 mg | ORAL_TABLET | Freq: Three times a day (TID) | ORAL | 0 refills | Status: DC

## 2018-05-10 MED ORDER — ALBUTEROL SULFATE (2.5 MG/3ML) 0.083% IN NEBU: 2.5000 mg | INHALATION_SOLUTION | RESPIRATORY_TRACT | 12 refills | Status: DC | PRN

## 2018-05-10 MED ORDER — REGADENOSON 0.4 MG/5ML IV SOLN
INTRAVENOUS | Status: AC
  Administered 2018-05-10: 0.4 mg via INTRAVENOUS
  Filled 2018-05-10: qty 5

## 2018-05-10 MED ORDER — AZITHROMYCIN 500 MG PO TABS: 500.0000 mg | ORAL_TABLET | Freq: Once | ORAL | 0 refills | Status: AC

## 2018-05-10 NOTE — Progress Notes (Addendum)
Progress Note  Patient Name: Brent [name] Date of Encounter: 05/10/2018  Primary Cardiologist: Peter Martinique, MD   Subjective   No complaints this morning. Sleeping flat in bed. No dyspnea. Denies CP.   Inpatient Medications    Scheduled Meds:  allopurinol  200 mg Oral Daily   carbamide peroxide  5 drop Right EAR BID   divalproex  1,500 mg Oral QHS   enoxaparin (LOVENOX) injection  40 mg Subcutaneous Q24H   [START ON 05/31/2018] epoetin alfa  10,000 Units Subcutaneous Q28 days   ferrous sulfate  325 mg Oral BID   hydrALAZINE  25 mg Oral Q8H   isosorbide mononitrate  30 mg Oral Daily   magnesium oxide  800 mg Oral TID   metoprolol succinate  50 mg Oral Daily   mirabegron ER  25 mg Oral Daily   OLANZapine  7.5 mg Oral QHS   pantoprazole  40 mg Oral Daily   prazosin  5 mg Oral QHS   sodium chloride flush  3 mL Intravenous Q12H   Continuous Infusions:  azithromycin     cefTRIAXone (ROCEPHIN)  IV     PRN Meds: acetaminophen **OR** acetaminophen, albuterol, ondansetron **OR** ondansetron (ZOFRAN) IV, polyethylene glycol   Vital Signs    Vitals:   05/09/18 1605 05/09/18 2331 05/10/18 0420 05/10/18 0809  BP: 106/68 137/86  126/77  Pulse: 83 95  88  Resp: 18 20  18   Temp: 98.2 F (36.8 C) 98.1 F (36.7 C)  97.8 F (36.6 C)  TempSrc:  Oral    SpO2: 96% 95%  97%  Weight:   75.3 kg   Height:        Intake/Output Summary (Last 24 hours) at 05/10/2018 1005 Last data filed at 05/10/2018 0900 Gross per 24 hour  Intake 240 ml  Output 500 ml  Net -260 ml   Last 3 Weights 05/10/2018 05/09/2018 05/05/2018  Weight (lbs) 166 lb 0.1 oz 163 lb 5.8 oz 169 lb 1.5 oz  Weight (kg) 75.3 kg 74.1 kg 76.7 kg      Telemetry    NSR- Personally Reviewed  ECG    NSR LVH - Personally Reviewed  Physical Exam   GEN: No acute distress.   Neck: No JVD Cardiac: RRR, no murmurs, rubs, or gallops.  Respiratory: Clear to auscultation bilaterally. GI: Soft, nontender,  non-distended  MS: No edema; No deformity. Neuro:  Nonfocal  Psych: Normal affect   Labs    Chemistry Recent Labs  Lab 05/08/18 0256 05/09/18 0251 05/10/18 0330  NA 141 143 142  K 3.9 3.9 4.1  CL 108 112* 112*  CO2 26 27 25   GLUCOSE 99 95 86  BUN 28* 26* 20  CREATININE 2.13* 1.86* 1.82*  CALCIUM 8.5* 8.1* 8.4*  PROT 5.8* 5.4* 5.4*  ALBUMIN 2.5* 2.5* 2.4*  AST 12* 11* 12*  ALT 7 8 8   ALKPHOS 47 50 45  BILITOT 0.6 0.4 0.6  GFRNONAA 30* 35* 36*  GFRAA 35* 41* 42*  ANIONGAP 7 4* 5     Hematology Recent Labs  Lab 05/08/18 0256 05/09/18 0251 05/10/18 0330  WBC 8.9 8.6 9.4  RBC 2.73* 2.65*   2.65* 2.74*  HGB 9.1* 8.8* 9.1*  HCT 28.5* 28.3* 29.4*  MCV 104.4* 106.8* 107.3*  MCH 33.3 33.2 33.2  MCHC 31.9 31.1 31.0  RDW 13.7 13.9 13.9  PLT 159 156 153    Cardiac Enzymes Recent Labs  Lab 05/05/18 0343  TROPONINI <0.03  Recent Labs  Lab 05/05/18 0500  TROPIPOC 0.00     BNP Recent Labs  Lab 05/05/18 0442  BNP 490.9*     DDimer No results for input(s): DDIMER in the last 168 hours.   Radiology    Dg Chest Port 1 View  Result Date: 05/10/2018 CLINICAL DATA:  Shortness of breath and confusion. EXAM: PORTABLE CHEST 1 VIEW COMPARISON:  05/09/2018 FINDINGS: Worsening infiltrate throughout the left lung and at the right lung base. No dense consolidation or lobar collapse. IMPRESSION: Worsening of infiltrate throughout the left lung consistent with bronchopneumonia. Mild patchy density also now seen at the right lung base. Electronically Signed   By: Nelson Chimes M.D.   On: 05/10/2018 08:05   Dg Chest Port 1 View  Result Date: 05/09/2018 CLINICAL DATA:  Shortness of breath. History of post inflammatory pulmonary fibrosis. EXAM: PORTABLE CHEST 1 VIEW COMPARISON:  05/08/2018. FINDINGS: Mild lingular and bibasilar opacities, likely reflecting atelectasis, less likely mild infection. No frank interstitial edema. No pleural effusions or pneumothorax. The heart is  normal in size. Mild degenerative changes of the mid thoracic spine. IMPRESSION: Mild lingular and bibasilar opacities, likely atelectasis, less likely mild infection. Electronically Signed   By: Julian Hy M.D.   On: 05/09/2018 08:35    Cardiac Studies   2D Echo 05/05/18 IMPRESSIONS    1. The left ventricle has moderate-severely reduced systolic function, with an ejection fraction of 30-35%. The cavity size was moderate to severely dilated. There is mild concentric left ventricular hypertrophy. Left ventricular diastolic Doppler  parameters are consistent with impaired relaxation. Left ventricular diffuse hypokinesis.  2. The right ventricle has normal systolic function. The cavity was normal. There is no increase in right ventricular wall thickness.  3. The aortic valve is tricuspid Aortic valve regurgitation is trivial by color flow Doppler.  4. The inferior vena cava was normal in size with <50% respiratory variability.  Patient Profile   73 y.o. male with a hx of Crohn's disease, celiac disease,CKD stage III,hypertension, COPD with remote tobacco dependence and bipolar disorderwho is being seen for the evaluation of newly diagnosed reduced EF/ acute systolic and diatolic CHFat the request of Dr. Alfredia Ferguson.    Assessment & Plan   1. Cardiomyopathy: Newly diagnosed reduced EF, 30-35%. The cavity size was moderate to severely dilated. There is mild concentric left ventricular hypertrophy. Left ventricular diastolic Doppler  parameters are consistent with impaired relaxation. Left ventricular diffuse hypokinesis. He had a run of polymorphic VT, only four beats, early on during admission. Dr. Percival Spanish has recommended a NST to r/o ischemia prior to d/c. Order placed to be done today. Appears euvolemic on exam. Denies dyspnea. Continue medical therapy for systolic HF. No ACE/ARB due to CKD. He is on hydralazine + nitrate for afterload reduction and  blocker therapy w/ Toprol XL.  BP  controlled.   2. Acute Systolic and Diastolic CHF: Appears euvolemic on exam. Denies dyspnea. Continue medical therapy for systolic HF. No ACE/ARB due to CKD. He is on hydralazine + nitrate for afterload reduction and  blocker therapy w/ Toprol XL.  BP controlled. Will need instructions once discharged to weigh daily and to adhere to low sodium diet, < 2 gm daily. He will need instructions to call cardiology office if > 3 lb weight gain in 24 hrs of > 5 lb in 1 week.   3. VT: He had a run of polymorphic VT, only four beats, early on during admission. No recurrent ventricular arrhthymias on  tele. K WNL at 4.1. On oral  blocker. Continue to monitor. NST today to r/o underlying coronary ischemia.   4. HTN: controlled on current regimen, on metoprolol, hydrazine and Imdur for coexistent systolic HF.   5. CKD, Stage III: Scr improving, down from 2.13>>1.86>>1.82. Baseline SCr over the past year has ranged from 1.7-2.2.    For questions or updates, please contact [facility] Please consult [email or web address] for contact info under        Signed, Lyda Jester, PA-C  05/10/2018, 10:05 AM    I have seen and examined the patient along with Lyda Jester, PA-C , PA NP.  I have reviewed the chart, notes and new data.  I agree with PA/NP's note.  Key new complaints: asymptomatic at rest, smiling Key examination changes: inferolaterally displaced apical impulse, otherwise normal CV exam Key new findings / data: renal parameters back to baseline.  PLAN: Fairly advanced CKD and absence of angina make coronary angio lower yield and higher risk. Will pursue contrast based procedures only if scintigraphy shows large reversible defects. For similar reason, non-RAAS vasodilator therapy is preferable.  Sanda Klein, MD, [place] [phone] 05/10/2018, 12:53 PM

## 2018-05-10 NOTE — Discharge Summary (Signed)
Physician Discharge Summary  BAILY SERPE VHQ:[id number] DOB: Jul 06, 1945 DOA: 05/05/2018  PCP: Gayland Curry, DO  Admit date: 05/05/2018 Discharge date: 05/10/2018  Admitted From: Home Disposition: Home  Recommendations for Outpatient Follow-up:  1. Follow up with PCP in 1-2 weeks 2. Follow up with Cardiology in 1-2 weeks  3. Please obtain CMP/CBC, Mag, Phos in one week 4. Repeat CXR in 3-6 weeks 5. Please follow up on the following pending results:  Home Health: No  Equipment/Devices: DME Nebulizer    Discharge Condition: Stable CODE STATUS: FULL Diet recommendation:   Brief/Interim Summary: HPI per Dr. Karmen Bongo on 05/05/2018 Brent [name] a 73 y.o.malewith medical history significant ofCrohn's; celiac disease; HTN; COPD with remote tobacco dependence; and bipolar d/o presenting with SOB.About 0200 this AM, he woke up and was wet with sweat and couldn't breathe. He was wheezing and extremely SOB. He was totally fine last night when he went to bed. He has not been coughing. No URI symptoms, fever, etc prior to going to bed last night. He feels markedly better now. They gave him medications with EMS but continued to have respiratory distress until he was in the ER. He is currently off BIPAP and feeling much better.  **Interim History He states he is feeling better but stopped IVF and gave another dose of IV Lasix the day before yesterday and one yesterday AM but none today given Slight bump in Cr. ECHO showed Systolic and Diastolic CHF so Cardiology was consulted for further evaluation and they adjusted medications and stopped Amlodipine.They recommended holding Lasix yesterday and evaluating and patient is tolerating Isosorbide-Hydralazine combination. He was able to be weaned off of Supplemental O2 today  PT to evaluate the patient still. Overnight 3/14-3/15 the patient had some Non-sustained polymorphic VTach so Cardiology wanted to do an inpatient ischemia evaluation  so the patient underwent a Lexiscan Myoview this morning.   The Myoview was read as "high risk" due to his severely depressed left ventricular systolic function but Dr. Sallyanne Kuster evaluated and he does not think that the mild patchy perfusion changes that were seen were indicative of coronary disease and opted for conservative medical management for CHF and recommended discharge on isosorbide mononitrate along with hydralazine and metoprolol.  Patient was euvolemic at discharge will follow with cardiology in 1 to 2 weeks to discuss about Lasix as well as further evaluation.  He will also need to follow-up with PCP and repeat chest x-ray in 3 to 6 weeks to ensure pneumonia is cleared.  He will be transitioned to p.o. antibiotics prior to discharge and will complete course tomorrow as he will have received 7 days of antibiotics total.   Discharge Diagnoses:  Principal Problem:   Acute respiratory failure with hypoxia ([place]) Active Problems:   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[place])   Chronic kidney disease (CKD), stage III (moderate) (HCC)   Anemia in chronic kidney disease   Essential hypertension, benign   Bipolar 1 disorder (HCC)   Multifocal pneumonia   Flash pulmonary edema (HCC)   Acute systolic CHF (congestive heart failure) (HCC)  Acute Respiratory Failure, improved and now on Room Air  -Patient without known respiratory compromise presenting with acute onset of respiratory failure -Marked improvement with BIPAP, now off BIPAP and weaned off of Supplemental O2.  -Suspect this was related to a combination of PNA and flash pulmonary edema in the setting of CHF -Continue Supplemental O2 via [place] and Wean O2 as tolerated; Weaned to Room Air  -Continuous Pulse  Oximetry and Maintain O2 Saturations >90% -Repeat CXR this AM showed "Worsening of infiltrate throughout the left lung consistent with bronchopneumonia. Mild patchy density also now seen at the right lung base." -Continue Treatment as Below  -Will  need Ambulatory Home O2 Screen Prior to D/C and did not Desaturate and has been weaned off -Will need PT Evaluation and did not need Home Health  Multifocal PNA with Sepsis Physiology -SIRS criteria in this patient includes: Leukocytosis, tachycardia, tachypnea, and hypoxia -Patient has evidence of acute organ failure with elevated lactate  -While awaiting blood cultures, this appears to be a preseptic condition -Sepsis protocol initiated; Sepsis Physiology is now much improved  -Suspected source isPNA - which correlates with respiratory compromise and apparent multifocal infiltrates appreciated on both CXR and chest CT -CURB-65 score is 1 and he was admitted  -Pneumonia Severity Index (PSI) is Class 4, 9% mortality. -Corticosteroids have been to shown to low overall mortality rate; risk of ARDS; and need for mechanical ventilation. This is particularly true in severe PNA (class 3+ PSI). Will add stress-dosed steroids since this patient is immunocompromised due to Remicade infusions and he is now on Hydrocortisone 50 mg q12h and will wean further to 50 mg q24h and stopped yesterday  -Started Azithromycin 500 mg daily AND Rocephin due to no risk factors for MDR cause and will change to po at D/C -Additional complicating factors include: hypoxia/hypoxemia; and immunocompromise.As a result, will admit the patient to the hospital. -IVF has now been discontinued  -Fever control with Acetaminophen -Repeat CBC in am -WBC went from 12.9 -> 12.2 -> 11.6 -> 8.9 -> 8.6 -> 9.4 -Sputum cultures never done  -Blood cultures x2 Showed NGTD at 5 Days -Will order lower respiratory tract procalcitonin level and was <0.10 and stopped Abx yesterday but restarted to complete 7 day course  -Will trend lactate to ensure improvement and went from 4.0 -> 2.5 -Repeat CXR as above and will need to repeat in the outpatient setting   Flash pulmonary edema in the setting of Acute Systolic and Diastolic CHF,  improved  -Patient without significant respiratory symptoms prior to acute respiratory episode. -Suspect that he was developing PNA and that this led to R heart strain with resultant flash pulmonary edema. -This improved with BIPAP and he is now stable from a respiratory standpoint.  -Will continue [place] O2 if necessary but this has been weaned to Room Air -S/p IV Lasix and now stopped -BNP was elevated at 490.0; this may be related to CKD but he does not have another known baseline. -ECHOCardiogram showing Systolic and Diastolic Dysfunction and LV Diffuse Hypokinesis  -Strict I's/O's and Daily Weights and Fluid Restrict to 1500 mL; Weight was 169 on admission and he is down 3 lbs to 166 -Patient is +1.012 Liters since admission -Continue to Monitor for Volume Overload  -Cardiology consulted for CHF and recommending holding diuretics and outpatient ECHO in 8-12 weeks but because of his polymorphic nonsustained VTACH he will undergo ischemic evaluation per Cards underwent a Lexiscan Myoview this morning which was read as high risk per cardiology evaluated and feels that appropriate medical conservative therapy is indicated at this time and deemed the patient to be stable to be discharged and follow-up with cardiology in the outpatient setting  Crohn's Disease -He receives Remicade infusions and so is immunocompromised. -Currently without concern for flare. -On Stress Dose Steroids -Closely Monitor   Stage 3 CKD  -Slightly worsened  -BUN/Cr went from 17/2.10 -> 20/1.88 -> 25/1.90 ->  28/2.13 -> 26/1.86 -> 20/1.82 -Holding IV Lasix again and will defer to cardiology in outpatient setting to start maintenance Lasix -Avoid Nephrotoxic Medications and Contrast Dyes if possible -Repeat CMP as an outpatient   Anemia of CKD/Macrocytic Anemia -On Procrit injections monthly. -Patient's Hb/Hct went from 11.2/33.0 -> 9.0/28.8 and ? Dilutional Drop; Repeat this AM was 9.1/29.4 -Checked Anemia Panel  and showed level of 34, U IBC 158, TIBC 192, saturation ratios 18%, ferritin level 360, folate level 3.9, and vitamin B12 level 209 -Continue to Monitor for S/Sx of Bleeding -Repeat CBC as an outpatient   HTN -Amlodipine 2.5 mg po Daily now discontinued -C/w Toprol XL 50 mg po Daily -Cardiology Started Isosorbide Mononitrate 30 mg po Daily and Hydralazine 25 mg po q8h -Cardiologyrecommending continuing these and optimizing BP control following up as an outpatient  Bipolar -Continue Depakote 1500 mg po qHS (Low at 18), Zyprexa 7.5 mg po qHS, and Prazosin 5 mg po qHS.  Right Ear Blockage -Will order Debrox -Now complaining of Left Ear also so will order Debrox for that as well will need to follow-up with PCP for ear flushing  Hypomagnesemia, improved -Patient's Mag Level this AM was 1.8 -Will continue Home Mag Oxide 800 mg po TID -Continue to Monitor and Replete as Necessary -Repeat Mag Level in the AM   Nonsustained polymorphic V Tach -Had a 4 beat run -Cardiology concerned and want to do an ischemic evaluation while patient is inpatient given his new reduced EF -Cardiology to set up a Lexiscan Myoview was read as high risk but cardiology feels that medical optimization is the best course of treatment currently so patient was deemed medically stable to be discharged and will follow-up with cardiology in the coming weeks  Discharge Instructions Discharge Instructions    Call MD for:  difficulty breathing, headache or visual disturbances   Complete by:  As directed    Call MD for:  extreme fatigue   Complete by:  As directed    Call MD for:  hives   Complete by:  As directed    Call MD for:  persistant dizziness or light-headedness   Complete by:  As directed    Call MD for:  persistant nausea and vomiting   Complete by:  As directed    Call MD for:  redness, tenderness, or signs of infection (pain, swelling, redness, odor or green/yellow discharge around incision site)    Complete by:  As directed    Call MD for:  severe uncontrolled pain   Complete by:  As directed    Call MD for:  temperature >100.4   Complete by:  As directed    Diet - low sodium heart healthy   Complete by:  As directed    Discharge instructions   Complete by:  As directed    You were cared for by a hospitalist during your hospital stay. If you have any questions about your discharge medications or the care you received while you were in the hospital after you are discharged, you can call the unit and ask to speak with the hospitalist on call if the hospitalist that took care of you is not available. Once you are discharged, your primary care physician will handle any further medical issues. Please note that NO REFILLS for any discharge medications will be authorized once you are discharged, as it is imperative that you return to your primary care physician (or establish a relationship with a primary care physician if you do  not have one) for your aftercare needs so that they can reassess your need for medications and monitor your lab values.  Follow up with PCP and Cardiology. Take all medications as prescribed. If symptoms change or worsen please return to the ED for evaluation   Increase activity slowly   Complete by:  As directed      Allergies as of 05/10/2018      Reactions   Azathioprine    REACTION: affected WBC   Ciprofloxacin    Pt denies any reaction   Levaquin [levofloxacin In D5w]    Pt denies any reaction   Plendil [felodipine]    Pt denies any reaction      Medication List    STOP taking these medications   amLODipine 2.5 MG tablet Commonly known as:  NORVASC     TAKE these medications   albuterol (2.5 MG/3ML) 0.083% nebulizer solution Commonly known as:  PROVENTIL Take 3 mLs (2.5 mg total) by nebulization every 4 (four) hours as needed for wheezing or shortness of breath.   allopurinol 100 MG tablet Commonly known as:  ZYLOPRIM Take 2 tablets (200 mg  total) by mouth daily.   azithromycin 500 MG tablet Commonly known as:  Zithromax Take 1 tablet (500 mg total) by mouth once for 1 dose.   Calcium Carbonate-Vit D-Min 600-400 MG-UNIT Tabs Take 1 tablet by mouth 3 (three) times daily.   carbamide peroxide 6.5 % OTIC solution Commonly known as:  DEBROX Place 5 drops into both ears 2 (two) times daily. For 5 days   cefdinir 300 MG capsule Commonly known as:  OMNICEF Take 1 capsule (300 mg total) by mouth every 12 (twelve) hours. Start taking on:  May 11, 2018   cholecalciferol 1000 units tablet Commonly known as:  VITAMIN D Take 1,000 Units by mouth daily.   divalproex 500 MG DR tablet Commonly known as:  DEPAKOTE Take 1,500 mg by mouth at bedtime.   ferrous sulfate 325 (65 FE) MG tablet Take 325 mg by mouth 2 (two) times daily.   hydrALAZINE 25 MG tablet Commonly known as:  APRESOLINE Take 1 tablet (25 mg total) by mouth every 8 (eight) hours.   inFLIXimab 100 MG injection Commonly known as:  Remicade Infuse Remicade IV schedule 1 10m/kg every 8 weeks Premedicate with Tylenol 500-6559mby mouth and Benadryl 25-5094my mouth prior to infusion. Last PPD was on 12/2009.   isosorbide mononitrate 30 MG 24 hr tablet Commonly known as:  IMDUR Take 1 tablet (30 mg total) by mouth daily. Start taking on:  May 11, 2018   LOPERAMIDE A-D PO Take 1 capsule by mouth as needed (loose stools).   magnesium oxide 400 (241.3 Mg) MG tablet Commonly known as:  MAG-OX Take 2,400 mg by mouth 3 (three) times daily.   meclizine 25 MG tablet Commonly known as:  ANTIVERT Take 25 mg by mouth 3 (three) times daily as needed for dizziness.   metoprolol succinate 50 MG 24 hr tablet Commonly known as:  TOPROL-XL Take 50 mg by mouth daily. Take with or immediately following a meal.   mirabegron ER 25 MG Tb24 tablet Commonly known as:  Myrbetriq Take 1 tablet (25 mg total) by mouth daily.   multivitamin tablet Take 1 tablet by mouth  daily.   OLANZapine 7.5 MG tablet Commonly known as:  ZyPREXA Take 1 tablet (7.5 mg total) by mouth at bedtime.   omeprazole 20 MG capsule Commonly known as:  PRILOSEC Take 20 mg  by mouth daily before supper.   polyethylene glycol packet Commonly known as:  MIRALAX / GLYCOLAX Take 17 g by mouth daily as needed for mild constipation.   prazosin 5 MG capsule Commonly known as:  MINIPRESS Take 5 mg by mouth at bedtime.   Procrit 10000 UNIT/ML injection Generic drug:  epoetin alfa Inject 10,000 Units into the skin every 30 (thirty) days.   vitamin E 400 UNIT capsule Take 400 Units by mouth daily.            Durable Medical Equipment  (From admission, onward)         Start     Ordered   05/10/18 1656  For home use only DME Nebulizer/meds  Once    Question Answer Comment  Patient needs a nebulizer to treat with the following condition Pneumonia   Patient needs a nebulizer to treat with the following condition SOB (shortness of breath)      05/10/18 1659         Follow-up Information    Almyra Deforest, PA Follow up.   Specialties:  Cardiology, Radiology Why:  Cardiology hospital follow up on 05/31/2018 at 10:00. Please arrive 15 minutes early for check in.  Contact information: [address] [place] [address] [phone]          Allergies  Allergen Reactions  . Azathioprine     REACTION: affected WBC  . Ciprofloxacin     Pt denies any reaction  . Levaquin [Levofloxacin In D5w]     Pt denies any reaction  . Plendil [Felodipine]     Pt denies any reaction   Consultations:  Cardiology  Procedures/Studies: Ct Chest Wo Contrast  Result Date: 05/05/2018 CLINICAL DATA:  Shortness of breath.  Abnormal chest x-ray EXAM: CT CHEST WITHOUT CONTRAST TECHNIQUE: Multidetector CT imaging of the chest was performed following the standard protocol without IV contrast. COMPARISON:  Chest x-ray from earlier today.  01/20/2017 chest CT FINDINGS:  Cardiovascular: No cardiomegaly. Small pericardial effusion that is slightly increased. No acute vascular finding. Coronary atherosclerosis. Mediastinum/Nodes: Mild nodal prominence considered reactive in this setting. Lungs/Pleura: There is generalized airway thickening. Mild patchy nodular and airspace opacities in the right middle and lower lobes. Patchy ground-glass density in the bilateral apex. There is dependent atelectasis and septal thickening with trace pleural effusions. Mild emphysema Upper Abdomen: Cholecystectomy which accounts for common bile duct prominence where visualized. Bilateral partially covered renal cystic densities. Musculoskeletal: No acute finding. IMPRESSION: 1. Multifocal pneumonia. 2. Mild interstitial edema with trace pleural effusions and pericardial effusion. 3. Lower lobe atelectasis. 4. Mild emphysema. Electronically Signed   By: Monte Fantasia M.D.   On: 05/05/2018 05:46   Nm Myocar Multi W/spect W/wall Motion / Ef  Result Date: 05/10/2018 CLINICAL DATA:  73 year old male with new onset heart failure EXAM: MYOCARDIAL IMAGING WITH SPECT (REST AND PHARMACOLOGIC-STRESS) GATED LEFT VENTRICULAR WALL MOTION STUDY LEFT VENTRICULAR EJECTION FRACTION TECHNIQUE: Standard myocardial SPECT imaging was performed after resting intravenous injection of 10 mCi Tc-1msestamibi. Subsequently, intravenous infusion of Lexiscan was performed under the supervision of the Cardiology staff. At peak effect of the drug, 30 mCi Tc-977mestamibi was injected intravenously and standard myocardial SPECT imaging was performed. Quantitative gated imaging was also performed to evaluate left ventricular wall motion, and estimate left ventricular ejection fraction. COMPARISON:  Chest x-ray 05/10/2018 FINDINGS: Perfusion: No evidence of progressive decreased radiotracer uptake following stress. There are areas of fixed decreased uptake in the antero septum and inferolateral  walls in the mid ventricle and  apex. Wall Motion: Left ventricular dilatation and global hypokinesis. Left Ventricular Ejection Fraction: 28 % End diastolic volume 175 ml End systolic volume 102 ml IMPRESSION: 1. No focal reversible ischemia. Patchy areas of fixed defect in the anteroseptal and inferolateral walls of the mid ventricle and apex which may represent areas of prior infarct/scarring. 2. Left ventricular dilatation with global hypokinesis. 3. Left ventricular ejection fraction 28% 4. Non invasive risk stratification*: High *2012 Appropriate Use Criteria for Coronary Revascularization Focused Update: J Am Coll Cardiol. 5852;77(8):242-353. http://content.airportbarriers.com.aspx?articleid=[email or web address] Electronically Signed   By: Jacqulynn Cadet M.D.   On: 05/10/2018 15:02   Dg Chest Port 1 View  Result Date: 05/10/2018 CLINICAL DATA:  Shortness of breath and confusion. EXAM: PORTABLE CHEST 1 VIEW COMPARISON:  05/09/2018 FINDINGS: Worsening infiltrate throughout the left lung and at the right lung base. No dense consolidation or lobar collapse. IMPRESSION: Worsening of infiltrate throughout the left lung consistent with bronchopneumonia. Mild patchy density also now seen at the right lung base. Electronically Signed   By: Nelson Chimes M.D.   On: 05/10/2018 08:05   Dg Chest Port 1 View  Result Date: 05/09/2018 CLINICAL DATA:  Shortness of breath. History of post inflammatory pulmonary fibrosis. EXAM: PORTABLE CHEST 1 VIEW COMPARISON:  05/08/2018. FINDINGS: Mild lingular and bibasilar opacities, likely reflecting atelectasis, less likely mild infection. No frank interstitial edema. No pleural effusions or pneumothorax. The heart is normal in size. Mild degenerative changes of the mid thoracic spine. IMPRESSION: Mild lingular and bibasilar opacities, likely atelectasis, less likely mild infection. Electronically Signed   By: Julian Hy M.D.   On: 05/09/2018 08:35   Dg Chest Port 1 View  Result Date: 05/08/2018 CLINICAL  DATA:  Shortness of breath, pneumonia EXAM: PORTABLE CHEST 1 VIEW COMPARISON:  05/07/2018 FINDINGS: Mild left basilar opacity, favoring atelectasis. Right lung is clear. No frank interstitial edema. No pleural effusion or pneumothorax. The heart is normal in size. IMPRESSION: Mild left basilar opacity, favoring atelectasis. Electronically Signed   By: Julian Hy M.D.   On: 05/08/2018 07:43   Dg Chest Port 1 View  Result Date: 05/07/2018 CLINICAL DATA:  73 year old male with shortness of breath EXAM: PORTABLE CHEST 1 VIEW COMPARISON:  Prior chest x-ray 05/05/2018 FINDINGS: Cardiomegaly, stable compared to prior imaging. Mediastinal contours are within normal limits. No evidence of pulmonary edema, pleural effusion, pneumothorax or focal airspace consolidation. Slight interval improvement of perihilar interstitial infiltrates suggest resolution of mild edema. Otherwise, the background bronchitic changes are similar. Trace bilateral pleural effusions and bibasilar atelectasis. No acute osseous abnormality. IMPRESSION: 1. Improving perihilar interstitial airspace opacities favors resolved interstitial edema. 2. Stable trace bilateral pleural effusions and bibasilar atelectasis. 3. Stable cardiomegaly. Electronically Signed   By: Jacqulynn Cadet M.D.   On: 05/07/2018 08:57   Dg Chest Portable 1 View  Result Date: 05/05/2018 CLINICAL DATA:  Shortness of breath EXAM: PORTABLE CHEST 1 VIEW COMPARISON:  05/31/2017 FINDINGS: Mild cardiac enlargement. New perihilar infiltration could represent edema or multifocal pneumonia. No blunting of costophrenic angles. No pneumothorax. Mediastinal contours appear intact. IMPRESSION: New perihilar infiltration suggesting edema or multifocal pneumonia. Cardiac enlargement. Electronically Signed   By: Lucienne Capers M.D.   On: 05/05/2018 04:02   Vas [name] Le Art Seg Multi (segm&le Reynauds)  Result Date: 05/04/2018 LOWER EXTREMITY DOPPLER STUDY Indications: Problematic  painful, thickened and elongated toenails. High Risk Factors: Hypertension, past history of smoking. Other Factors: Barely palpable tibial pulses, bilaterally, as well as  decreased                capillary refill time to all toes. Skin on the calves and feet is                shiny and thin.  Comparison Study: None Performing Technologist: [employer] BS, RVT, RDCS  Examination Guidelines: A complete evaluation includes at minimum, Doppler waveform signals and systolic blood pressure reading at the level of bilateral brachial, anterior tibial, and posterior tibial arteries, when vessel segments are accessible. Bilateral testing is considered an integral part of a complete examination. Photoelectric Plethysmograph (PPG) waveforms and toe systolic pressure readings are included as required and additional duplex testing as needed. Limited examinations for reoccurring indications may be performed as noted.  ABI Findings: +---------+------------------+-----+---------+--------+ Right    Rt Pressure (mmHg)IndexWaveform Comment  +---------+------------------+-----+---------+--------+ Brachial 118                                      +---------+------------------+-----+---------+--------+ CFA                             triphasic         +---------+------------------+-----+---------+--------+ Popliteal                       triphasic         +---------+------------------+-----+---------+--------+ ATA      139               1.18 triphasic         +---------+------------------+-----+---------+--------+ PTA      144               1.22 triphasic         +---------+------------------+-----+---------+--------+ PERO     144               1.22 triphasic         +---------+------------------+-----+---------+--------+ Great Toe143               1.21 Normal            +---------+------------------+-----+---------+--------+ +---------+------------------+-----+---------+-------+ Left      Lt Pressure (mmHg)IndexWaveform Comment +---------+------------------+-----+---------+-------+ Brachial 113                                     +---------+------------------+-----+---------+-------+ CFA                             triphasic        +---------+------------------+-----+---------+-------+ Popliteal                       triphasic        +---------+------------------+-----+---------+-------+ ATA      142               1.20 triphasic        +---------+------------------+-----+---------+-------+ PTA      136               1.15 triphasic        +---------+------------------+-----+---------+-------+ PERO     137               1.16 triphasic        +---------+------------------+-----+---------+-------+ Lucia Gaskins  1.22 Normal           +---------+------------------+-----+---------+-------+ +-------+-----------+-----------+------------+------------+ ABI/TBIToday's ABIToday's TBIPrevious ABIPrevious TBI +-------+-----------+-----------+------------+------------+ Right  1.22       1.21                                +-------+-----------+-----------+------------+------------+ Left   1.2        1.22                                +-------+-----------+-----------+------------+------------+ TOES Findings: +----------+---------------+--------+-------+ Right ToesPressure (mmHg)WaveformComment +----------+---------------+--------+-------+ 1st Digit 143            Normal          +----------+---------------+--------+-------+ 2nd Digit                Normal          +----------+---------------+--------+-------+ 3rd Digit                Normal          +----------+---------------+--------+-------+ 4th Digit                Normal          +----------+---------------+--------+-------+ 5th Digit                Normal          +----------+---------------+--------+-------+ +---------+---------------+--------+-------+  Left ToesPressure (mmHg)WaveformComment +---------+---------------+--------+-------+ 1st [id number]            Normal          +---------+---------------+--------+-------+ 2nd Digit               Normal          +---------+---------------+--------+-------+ 3rd Digit               Normal          +---------+---------------+--------+-------+ 4th Digit               Normal          +---------+---------------+--------+-------+ 5th Digit               Normal          +---------+---------------+--------+-------+   Summary: Right: Resting right ankle-brachial index is within normal range. No evidence of significant right lower extremity arterial disease. The right toe-brachial index is normal. RT great toe pressure = 143 mmHg. Left: Resting left ankle-brachial index is within normal range. No evidence of significant left lower extremity arterial disease. The left toe-brachial index is normal. LT Great toe pressure = 144 mmHg.  *See table(s) above for measurements and observations.  Electronically signed by Kathlyn Sacramento MD on 05/04/2018 at 5:10:29 PM.    Final    ECHOCARDIOGRAM IMPRESSIONS    1. The left ventricle has moderate-severely reduced systolic function, with an ejection fraction of 30-35%. The cavity size was moderate to severely dilated. There is mild concentric left ventricular hypertrophy. Left ventricular diastolic Doppler  parameters are consistent with impaired relaxation. Left ventricular diffuse hypokinesis.  2. The right ventricle has normal systolic function. The cavity was normal. There is no increase in right ventricular wall thickness.  3. The aortic valve is tricuspid Aortic valve regurgitation is trivial by color flow Doppler.  4. The inferior vena cava was normal in size with <50% respiratory variability.  FINDINGS  Left Ventricle: The left ventricle has moderate-severely reduced systolic function, with an ejection fraction of  30-35%. The cavity size was  moderate to severely dilated. There is mild concentric left ventricular hypertrophy. Left ventricular diastolic  Doppler parameters are consistent with impaired relaxation. Left ventricular diffuse hypokinesis. Right Ventricle: The right ventricle has normal systolic function. The cavity was normal. There is no increase in right ventricular wall thickness. Left Atrium: left atrial size was normal in size Right Atrium: right atrial size was normal in size. Right atrial pressure is estimated at 8 mmHg. Interatrial Septum: No atrial level shunt detected by color flow Doppler. Pericardium: There is no evidence of pericardial effusion. Mitral Valve: The mitral valve is normal in structure. Mitral valve regurgitation is mild by color flow Doppler. Tricuspid Valve: The tricuspid valve is normal in structure. Tricuspid valve regurgitation is trivial by color flow Doppler. Aortic Valve: The aortic valve is tricuspid Aortic valve regurgitation is trivial by color flow Doppler. There is no evidence of aortic valve stenosis. Pulmonic Valve: The pulmonic valve was grossly normal. Pulmonic valve regurgitation is not visualized by color flow Doppler. Pulmonary Artery: The pulmonary artery is of normal size and origin. Venous: The inferior vena cava is normal in size with less than 50% respiratory variability.   LEFT VENTRICLE PLAX 2D LVIDd:         6.30 cm       Diastology LVIDs:         5.40 cm       LV e' lateral:   6.53 cm/s LV PW:         1.20 cm       LV E/e' lateral: 14.7 LV IVS:        1.30 cm       LV e' medial:    7.94 cm/s LVOT diam:     2.50 cm       LV E/e' medial:  12.1 LV SV:         60 ml LV SV Index:   30.32 LVOT Area:     4.91 cm   LV Volumes (MOD) LV area d, A4C:    50.40 cm LV area s, A4C:    38.10 cm LV major d, A4C:   8.95 cm LV major s, A4C:   8.45 cm LV vol d, MOD A4C: 234.0 ml LV vol s, MOD A4C: 142.0 ml LV SV MOD A4C:     234.0 ml  RIGHT VENTRICLE RV Basal diam:  3.30  cm RV S prime:     19.60 cm/s TAPSE (M-mode): 2.8 cm RVSP:           21.5 mmHg  LEFT ATRIUM             Index       RIGHT ATRIUM           Index LA diam:        3.90 cm 1.92 cm/m  RA Pressure: 8 mmHg LA Vol (A2C):   37.4 ml 18.44 ml/m RA Area:     14.60 cm LA Vol (A4C):   54.9 ml 27.06 ml/m RA Volume:   34.30 ml  16.91 ml/m LA Biplane Vol: 49.5 ml 24.40 ml/m  AORTIC VALVE LVOT Vmax:   102.00 cm/s LVOT Vmean:  70.200 cm/s LVOT VTI:    0.189 m AR PHT:      599 msec   AORTA Ao Root diam: 3.80 cm  MITRAL VALVE               TRICUSPID VALVE MV Area (PHT): 22.31 cm  TR Peak grad:   13.5 mmHg MV PHT:        9.86 msec   TR Vmax:        218.00 cm/s MV Decel Time: 34 msec     RVSP:           21.5 mmHg MV E velocity: 96.00 cm/s MV A velocity: 135.00 cm/s SHUNTS MV E/A ratio:  0.71        Systemic VTI:  0.19 m                            Systemic Diam: 2.50 cm  LEXISCAN MYOVIEW FINDINGS: Perfusion: No evidence of progressive decreased radiotracer uptake following stress. There are areas of fixed decreased uptake in the antero septum and inferolateral walls in the mid ventricle and apex.  Wall Motion: Left ventricular dilatation and global hypokinesis.  Left Ventricular Ejection Fraction: 28 %  End diastolic volume 578 ml  End systolic volume 469 ml  IMPRESSION: 1. No focal reversible ischemia. Patchy areas of fixed defect in the anteroseptal and inferolateral walls of the mid ventricle and apex which may represent areas of prior infarct/scarring.  2. Left ventricular dilatation with global hypokinesis.  3. Left ventricular ejection fraction 28%  4. Non invasive risk stratification*: High  Subjective: Seen and examined at bedside was doing well denies any chest pain, lightheadedness or dizziness.  Shortness of breath is completely resolved now.  Has minimal lower extremity leg swelling.  No other concerns or complaints at this time and ears are feeling a  little bit better but still want some flush.  No other concerns and ready for discharge.  Discharge Exam: Vitals:   05/10/18 1249 05/10/18 1251  BP: 136/73 137/75  Pulse: (!) 103 100  Resp:    Temp:    SpO2:     Vitals:   05/10/18 1229 05/10/18 1247 05/10/18 1249 05/10/18 1251  BP: 135/75 135/81 136/73 137/75  Pulse: 88 85 (!) 103 100  Resp:      Temp:      TempSrc:      SpO2:      Weight:      Height:       General: Pt is alert, awake, not in acute distress Cardiovascular: RRR, S1/S2 +, no rubs, no gallops Respiratory: Diminished bilaterally, no wheezing, no rhonchi Abdominal: Soft, NT, ND, bowel sounds + Extremities: Mild edema, no cyanosis  The results of significant diagnostics from this hospitalization (including imaging, microbiology, ancillary and laboratory) are listed below for reference.    Microbiology: Recent Results (from the past 240 hour(s))  Blood culture (routine x 2)     Status: None   Collection Time: 05/05/18  8:07 AM  Result Value Ref Range Status   Specimen Description BLOOD RIGHT HAND  Final   Special Requests   Final    BOTTLES DRAWN AEROBIC AND ANAEROBIC Blood Culture adequate volume   Culture   Final    NO GROWTH 5 DAYS Performed at [facility], [address], [place], [place] [address]    Report Status 05/10/2018 FINAL  Final  Blood culture (routine x 2)     Status: None   Collection Time: 05/05/18  8:15 AM  Result Value Ref Range Status   Specimen Description BLOOD LEFT HAND  Final   Special Requests   Final    BOTTLES DRAWN AEROBIC AND ANAEROBIC Blood Culture adequate volume   Culture  Final    NO GROWTH 5 DAYS Performed at [facility], [place] [address], [place], [place] [address]    Report Status 05/10/2018 FINAL  Final    Labs: BNP (last 3 results) Recent Labs    05/05/18 0442  BNP 629.4*   Basic Metabolic Panel: Recent Labs  Lab 05/06/18 0252 05/07/18 0336 05/08/18 0256 05/09/18 0251 05/10/18 0330  NA  139 141 141 143 142  K 4.2 3.9 3.9 3.9 4.1  CL 110 108 108 112* 112*  CO2 21* 25 26 27 25   GLUCOSE 119* 100* 99 95 86  BUN 20 25* 28* 26* 20  CREATININE 1.88* 1.90* 2.13* 1.86* 1.82*  CALCIUM 8.2* 8.6* 8.5* 8.1* 8.4*  MG  --  1.4* 1.9 1.8 1.8  PHOS  --  2.7 3.0 3.1 3.0   Liver Function Tests: Recent Labs  Lab 05/07/18 0336 05/08/18 0256 05/09/18 0251 05/10/18 0330  AST 10* 12* 11* 12*  ALT 8 7 8 8   ALKPHOS 53 47 50 45  BILITOT 0.5 0.6 0.4 0.6  PROT 6.0* 5.8* 5.4* 5.4*  ALBUMIN 2.6* 2.5* 2.5* 2.4*   No results for input(s): LIPASE, AMYLASE in the last 168 hours. No results for input(s): AMMONIA in the last 168 hours. CBC: Recent Labs  Lab 05/06/18 0252 05/07/18 0336 05/08/18 0256 05/09/18 0251 05/10/18 0330  WBC 12.2* 11.6* 8.9 8.6 9.4  NEUTROABS  --  10.0* 7.1 5.0 6.1  HGB 9.0* 9.3* 9.1* 8.8* 9.1*  HCT 28.8* 29.3* 28.5* 28.3* 29.4*  MCV 103.6* 103.5* 104.4* 106.8* 107.3*  PLT 174 174 159 156 153   Cardiac Enzymes: Recent Labs  Lab 05/05/18 0343  TROPONINI <0.03   BNP: Invalid input(s): POCBNP CBG: No results for input(s): GLUCAP in the last 168 hours. D-Dimer No results for input(s): DDIMER in the last 72 hours. Hgb A1c No results for input(s): HGBA1C in the last 72 hours. Lipid Profile No results for input(s): CHOL, HDL, LDLCALC, TRIG, CHOLHDL, LDLDIRECT in the last 72 hours. Thyroid function studies No results for input(s): TSH, T4TOTAL, T3FREE, THYROIDAB in the last 72 hours.  Invalid input(s): FREET3 Anemia work up Recent Labs    05/09/18 0251  VITAMINB12 208  FOLATE 3.8*  FERRITIN 360*  TIBC 192*  IRON 34*  RETICCTPCT 2.4   Urinalysis    Component Value Date/Time   COLORURINE YELLOW 02/07/2018 [place] 02/07/2018 1255   LABSPEC 1.017 02/07/2018 1255   PHURINE 5.0 02/07/2018 1255   GLUCOSEU NEGATIVE 02/07/2018 1255   [place] 02/07/2018 1255   [place] 02/07/2018 1255   BILIRUBINUR neg 10/03/2014  1152   KETONESUR 5 (A) 02/07/2018 1255   PROTEINUR NEGATIVE 02/07/2018 1255   UROBILINOGEN negative 10/03/2014 1152   UROBILINOGEN 0.2 07/10/2010 1709   NITRITE NEGATIVE 02/07/2018 1255   LEUKOCYTESUR NEGATIVE 02/07/2018 1255   Sepsis Labs Invalid input(s): PROCALCITONIN,  WBC,  LACTICIDVEN Microbiology Recent Results (from the past 240 hour(s))  Blood culture (routine x 2)     Status: None   Collection Time: 05/05/18  8:07 AM  Result Value Ref Range Status   Specimen Description BLOOD RIGHT HAND  Final   Special Requests   Final    BOTTLES DRAWN AEROBIC AND ANAEROBIC Blood Culture adequate volume   Culture   Final    NO GROWTH 5 DAYS Performed at [facility], [place] [address] [place], [place] [address]    Report Status 05/10/2018 FINAL  Final  Blood  culture (routine x 2)     Status: None   Collection Time: 05/05/18  8:15 AM  Result Value Ref Range Status   Specimen Description BLOOD LEFT HAND  Final   Special Requests   Final    BOTTLES DRAWN AEROBIC AND ANAEROBIC Blood Culture adequate volume   Culture   Final    NO GROWTH 5 DAYS Performed at [facility], [address], [place], [place] [address]    Report Status 05/10/2018 FINAL  Final   Time coordinating discharge: 35 minutes  SIGNED:  Kerney Elbe, DO [facility] 05/10/2018, 7:45 PM Pager is on [place]  If 7PM-7AM, please contact night-coverage [email or web address] Password [email or web address]

## 2018-05-10 NOTE — Progress Notes (Signed)
    Patient presented for Lexiscan nuclear stress test. Tolerated procedure well. Pending final stress imaging result.  Daune Perch, AGNP-C 05/10/2018  12:56 PM Pager: [phone]

## 2018-05-10 NOTE — Progress Notes (Signed)
I have reviewed Brent [name] nuclear images. I agree that this is a "high risk" study due to the severely depressed left ventricular systolic function (we already knew this by echo).  He only has very limited, mild and fixed perfusion abnormalities. I do not think that the mild and patchy perfusion changes that are seen are indicative of coronary disease.  They are very limited in extent and do not follow typical coronary distribution.  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 In this patient with advanced chronic kidney disease and without angina, I believe it is better to proceed with conservative medical therapy for congestive heart failure.  Sanda Klein, MD, [facility] [phone] office [phone] pager

## 2018-05-11 ENCOUNTER — Ambulatory Visit (INDEPENDENT_AMBULATORY_CARE_PROVIDER_SITE_OTHER): Payer: Medicare Other | Admitting: Family

## 2018-05-11 ENCOUNTER — Encounter: Payer: Self-pay | Admitting: Family

## 2018-05-11 ENCOUNTER — Ambulatory Visit: Payer: Medicare Other | Admitting: Cardiovascular Disease

## 2018-05-11 ENCOUNTER — Other Ambulatory Visit: Payer: Self-pay

## 2018-05-11 VITALS — BP 130/64 | HR 84 | Temp 98.5°F | Resp 10 | Ht 76.0 in | Wt 169.0 lb

## 2018-05-11 DIAGNOSIS — H6121 Impacted cerumen, right ear: Secondary | ICD-10-CM

## 2018-05-11 DIAGNOSIS — I5042 Chronic combined systolic (congestive) and diastolic (congestive) heart failure: Secondary | ICD-10-CM

## 2018-05-11 DIAGNOSIS — I10 Essential (primary) hypertension: Secondary | ICD-10-CM

## 2018-05-11 DIAGNOSIS — N183 Chronic kidney disease, stage 3 unspecified: Secondary | ICD-10-CM

## 2018-05-11 DIAGNOSIS — D631 Anemia in chronic kidney disease: Secondary | ICD-10-CM

## 2018-05-11 DIAGNOSIS — H6122 Impacted cerumen, left ear: Secondary | ICD-10-CM

## 2018-05-11 DIAGNOSIS — H9191 Unspecified hearing loss, right ear: Secondary | ICD-10-CM

## 2018-05-11 NOTE — Progress Notes (Signed)
Provider: Pranav Lince FNP-C  Brent Curry, Brent [name]  Patient Care Team: Brent Curry, Brent [name] as PCP - General (Geriatric Medicine) [place], Peter M, MD as PCP - Cardiology (Cardiology) Elmarie Shiley, MD (Nephrology) Milus Banister, MD (Gastroenterology)  Extended Emergency Contact Information Primary Emergency Contact: [place] Mobile Phone: [phone] Relation: Sister  Goals of care: Advanced Directive information Advanced Directives 05/05/2018  Does Patient Have a Medical Advance Directive? No  Type of Advance Directive -  Does patient want to make changes to medical advance directive? -  Copy of [place] in Chart? -  Would patient like information on creating a medical advance directive? -  Pre-existing out of facility DNR order (yellow form or pink MOST form) -     Chief Complaint  Patient presents with   Acute Visit    Bilateral ear fullness    Medication Management    Patient taking Oxybutrin, not sure of dose or who prescribed (FYI)     HPI:  Pt is a 73 y.o. male seen today for an acute visit for bilateral ear lavage.Debrox ordered in the ED.He states worsening hearing on right ear.He was recently admitted in the Hospital from 05/05/2018-05/10/2018 for shortness of breath.He was treated for acute respiratory failure with hypoxia and acute systolic congestive heart failure.His respiratory failure improved with Bipap and oxygen supplementation.CXR showed bronchopneumonia.Sepsis protocol was initiated WBC 12.9> 12.2> 11.6 >8.9>8.6> 9.4.Blood culture x 2 showed NGTD at 5 days. His pneumonia was treated with Azithromycin and Rocephin daily then changed to oral during discharged.His admission was also complicated by immunocompromise due to Remicade infusion and hydrocortisone 50 mg tablet every 12 hours.Hydrocortisone was weaned off and stopped 05/10/2018. He was also seen by Cardiology for acute systolic and diastolic CHF. His BNP was elevated 490.0 IVF  lasix given.ECHO EF 52-77% showed systolic and diastolic dysfunction and LV diffuse hypokinesis.Strict I &O,daily weights and fluid restrictions 1500 ml recommended.He denies any cough,shortness of breath or edema.he states feeling much better since discharge from the hospital.He states does not check his weight at home. Has upcoming follow up appointment with [facility] 05/31/2018 at 10 Am.   Crohn's Disease - states had diarrhea at the hospital but thought could have been due to stool softener.on Remicade infusions and stress does steroids.states diarrhea has resolved.   Hypertension - seen by cardiology during admission Imdur 30 mg tablet daily and hydralazine 25 mg tablet every 8 hours was initiated.continued on metoprolol daily.   Anemia of CKD/macrocytic Anemia - on monthly Procrit injections.Hgb dropped from 11.2 down to 9.0 no signs of bleeding.  CKD stage 3 - Had worsening BUN/CR therefore I.V lasix was held.He denies any decreased urinary output.   Hypomagnesemia - His mg level was 1.8 on mag oxide 800 mg tablet three times daily.repeat mg level recommended.  Past Medical History:  Diagnosis Date   Anemia    Anxiety    Benign paroxysmal positional vertigo    Bipolar I disorder, most recent episode (or current) unspecified    Celiac disease    Cellulitis and abscess of unspecified site    Chronic kidney disease, stage III (moderate) (HCC)    Crohn's    Remicade q8 weeks   Depression    Dizziness and giddiness    Essential and other specified forms of tremor    medication-induced Parkinson's, now resolved   Essential hypertension, benign    Gout 2018   Hypertrophy of prostate without urinary obstruction and other lower urinary tract  symptoms (LUTS)    Hypopotassemia    Impotence of organic origin    Insomnia with sleep apnea, unspecified    Iron deficiency anemia, unspecified    Loss of weight    Malnutrition of mild degree (HCC)    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   Neuralgia, neuritis, and radiculitis, unspecified    Other B-complex deficiencies    Other extrapyramidal disease and abnormal movement disorder    Postinflammatory pulmonary fibrosis (HCC)    Tobacco use disorder    Vertigo 2018   Viral warts, unspecified    Past Surgical History:  Procedure Laterality Date   CHOLECYSTECTOMY  07-12-2010   SMALL INTESTINE SURGERY     x 2    Allergies  Allergen Reactions   Azathioprine     REACTION: affected WBC   Ciprofloxacin     Pt denies any reaction   Levaquin [Levofloxacin In D5w]     Pt denies any reaction   Plendil [Felodipine]     Pt denies any reaction    Outpatient Encounter Medications as of 05/11/2018  Medication Sig   albuterol (PROVENTIL) (2.5 MG/3ML) 0.083% nebulizer solution Take 3 mLs (2.5 mg total) by nebulization every 4 (four) hours as needed for wheezing or shortness of breath.   allopurinol (ZYLOPRIM) 100 MG tablet Take 2 tablets (200 mg total) by mouth daily.   Calcium Carbonate-Vit D-Min 600-400 MG-UNIT TABS Take 1 tablet by mouth 3 (three) times daily.    cefdinir (OMNICEF) 300 MG capsule Take 1 capsule (300 mg total) by mouth every 12 (twelve) hours.   cholecalciferol (VITAMIN D) 1000 units tablet Take 1,000 Units by mouth daily.    divalproex (DEPAKOTE) 500 MG EC tablet Take 1,500 mg by mouth at bedtime.    epoetin alfa (PROCRIT) 29528 UNIT/ML injection Inject 10,000 Units into the skin every 30 (thirty) days.    ferrous sulfate 325 (65 FE) MG tablet Take 325 mg by mouth 2 (two) times daily.     hydrALAZINE (APRESOLINE) 25 MG tablet Take 1 tablet (25 mg total) by mouth every 8 (eight) hours.   inFLIXimab (REMICADE) 100 MG injection Infuse Remicade IV schedule 1 30m/kg every 8 weeks Premedicate with Tylenol 500-6523mby mouth and Benadryl 25-5052my mouth prior to infusion. Last PPD was on 12/2009.    isosorbide mononitrate (IMDUR) 30 MG 24 hr tablet Take 1 tablet (30 mg total) by  mouth daily.   Loperamide HCl (LOPERAMIDE A-D PO) Take 1 capsule by mouth as needed (loose stools).    magnesium oxide (MAG-OX) 400 (241.3 Mg) MG tablet Take 2,400 mg by mouth 3 (three) times daily.    meclizine (ANTIVERT) 25 MG tablet Take 25 mg by mouth 3 (three) times daily as needed for dizziness.    metoprolol succinate (TOPROL-XL) 50 MG 24 hr tablet Take 50 mg by mouth daily. Take with or immediately following a meal.   Multiple Vitamin (MULTIVITAMIN) tablet Take 1 tablet by mouth daily.     OLANZapine (ZYPREXA) 7.5 MG tablet Take 1 tablet (7.5 mg total) by mouth at bedtime.   omeprazole (PRILOSEC) 20 MG capsule Take 20 mg by mouth daily before supper.     polyethylene glycol (MIRALAX / GLYCOLAX) packet Take 17 g by mouth daily as needed for mild constipation.   prazosin (MINIPRESS) 5 MG capsule Take 5 mg by mouth at bedtime.   vitamin E 400 UNIT capsule Take 400 Units by mouth daily.     [DISCONTINUED] carbamide peroxide (DEBROX) 6.5 % OTIC solution  Place 5 drops into both ears 2 (two) times daily. For 5 days   [DISCONTINUED] mirabegron ER (MYRBETRIQ) 25 MG TB24 tablet Take 1 tablet (25 mg total) by mouth daily.   No facility-administered encounter medications on file as of 05/11/2018.     Review of Systems  Constitutional: Negative for appetite change, chills, fatigue, fever and unexpected weight change.  HENT: Negative for congestion, postnasal drip, rhinorrhea, sinus pressure, sinus pain, sneezing and sore throat.   Eyes: Positive for visual disturbance. Negative for pain, discharge, redness and itching.       Wears eye glasses   Respiratory: Negative for cough, chest tightness, shortness of breath and wheezing.   Cardiovascular: Negative for chest pain, palpitations and leg swelling.  Gastrointestinal: Negative for abdominal distention, abdominal pain, constipation, diarrhea, nausea and vomiting.  Endocrine: Negative for cold intolerance, heat intolerance, polydipsia,  polyphagia and polyuria.  Genitourinary: Negative for dysuria, flank pain, frequency and urgency.  Musculoskeletal: Negative for arthralgias and myalgias.  Skin: Negative for color change, pallor and rash.  Neurological: Negative for dizziness, light-headedness and headaches.  Psychiatric/Behavioral: Negative for agitation, confusion and sleep disturbance. The patient is not nervous/anxious.     Immunization History  Administered Date(s) Administered   Influenza, High Dose Seasonal PF 11/26/2016   Influenza,inj,Quad PF,6+ Mos 11/13/2014, 01/03/2016   Influenza-Unspecified 10/25/2012, 11/24/2016, 11/24/2017   PPD Test 12/30/2010, 01/05/2012, 01/07/2013, 01/13/2014   Pneumococcal Conjugate-13 08/10/2014   Pneumococcal Polysaccharide-23 03/05/2004, 01/03/2016, 05/06/2018   Tdap 05/26/2011   Pertinent  Health Maintenance Due  Topic Date Due   COLONOSCOPY  09/17/2027   INFLUENZA VACCINE  Completed   PNA vac Low Risk Adult  Completed   Fall Risk  04/30/2018 03/12/2018 02/10/2018 02/02/2018 01/28/2018  Falls in the past year? 0 1 1 0 0  Number falls in past yr: 0 0 1 - 0  Injury with Fall? 0 0 0 - 0    Vitals:   05/11/18 1314  BP: 130/64  Pulse: 84  Resp: 10  Temp: 98.5 F (36.9 C)  TempSrc: Oral  SpO2: 98%  Weight: 169 lb (76.7 kg)  Height: 6' 4"  (1.93 m)   Body mass index is 20.57 kg/m. Physical Exam Vitals signs reviewed.  Constitutional:      General: He is not in acute distress.    Appearance: He is normal weight. He is not ill-appearing.  HENT:     Head: Normocephalic.     Ears:     Comments: Bilateral ear cerumen impaction.Both ears lavaged with warm water.large amounts of cerumen removed using alligator forceps on left ear.Small amounts removed using curette unable to completely remove some impeded cerumen in right ear.       Nose: Nose normal. No congestion or rhinorrhea.     Mouth/Throat:     Mouth: Mucous membranes are moist.     Pharynx: Oropharynx  is clear. No oropharyngeal exudate or posterior oropharyngeal erythema.  Eyes:     General: No scleral icterus.       Right eye: No discharge.        Left eye: No discharge.     Conjunctiva/sclera: Conjunctivae normal.     Pupils: Pupils are equal, round, and reactive to light.     Comments: Corrective lens in place   Neck:     Musculoskeletal: Normal range of motion. No neck rigidity or muscular tenderness.  Cardiovascular:     Rate and Rhythm: Normal rate and regular rhythm.     Pulses: Normal pulses.  Heart sounds: Normal heart sounds. No murmur. No friction rub. No gallop.   Pulmonary:     Effort: Pulmonary effort is normal. No respiratory distress.     Breath sounds: Normal breath sounds. No wheezing, rhonchi or rales.  Chest:     Chest wall: No tenderness.  Abdominal:     General: Bowel sounds are normal. There is no distension.     Palpations: Abdomen is soft. There is no mass.     Tenderness: There is no abdominal tenderness. There is no right CVA tenderness, left CVA tenderness, guarding or rebound.  Musculoskeletal: Normal range of motion.        General: No swelling or tenderness.     Right lower leg: No edema.     Left lower leg: No edema.  Lymphadenopathy:     Cervical: No cervical adenopathy.  Skin:    General: Skin is warm and dry.     Coloration: Skin is not pale.     Findings: No erythema or rash.  Neurological:     Mental Status: He is alert and oriented to person, place, and time.     Cranial Nerves: No cranial nerve deficit.     Sensory: No sensory deficit.     Motor: No weakness.     Coordination: Coordination normal.  Psychiatric:        Mood and Affect: Mood normal.        Behavior: Behavior normal.        Thought Content: Thought content normal.        Judgment: Judgment normal.     Labs reviewed: Recent Labs    05/08/18 0256 05/09/18 0251 05/10/18 0330  NA 141 143 142  K 3.9 3.9 4.1  CL 108 112* 112*  CO2 26 27 25   GLUCOSE 99 95 86   BUN 28* 26* 20  CREATININE 2.13* 1.86* 1.82*  CALCIUM 8.5* 8.1* 8.4*  MG 1.9 1.8 1.8  PHOS 3.0 3.1 3.0   Recent Labs    05/08/18 0256 05/09/18 0251 05/10/18 0330  AST 12* 11* 12*  ALT 7 8 8   ALKPHOS 47 50 45  BILITOT 0.6 0.4 0.6  PROT 5.8* 5.4* 5.4*  ALBUMIN 2.5* 2.5* 2.4*   Recent Labs    05/08/18 0256 05/09/18 0251 05/10/18 0330  WBC 8.9 8.6 9.4  NEUTROABS 7.1 5.0 6.1  HGB 9.1* 8.8* 9.1*  HCT 28.5* 28.3* 29.4*  MCV 104.4* 106.8* 107.3*  PLT 159 156 153   Lab Results  Component Value Date   TSH 0.202 (L) 05/07/2018   Lab Results  Component Value Date   HGBA1C 4.9 08/10/2015   Lab Results  Component Value Date   CHOL 138 05/04/2017   HDL 42 05/04/2017   LDLCALC 71 05/04/2017   TRIG 176 (H) 05/04/2017   CHOLHDL 3.3 05/04/2017    Significant Diagnostic Results in last 30 days:  Ct Chest Wo Contrast  Result Date: 05/05/2018 CLINICAL DATA:  Shortness of breath.  Abnormal chest x-ray EXAM: CT CHEST WITHOUT CONTRAST TECHNIQUE: Multidetector CT imaging of the chest was performed following the standard protocol without IV contrast. COMPARISON:  Chest x-ray from earlier today.  01/20/2017 chest CT FINDINGS: Cardiovascular: No cardiomegaly. Small pericardial effusion that is slightly increased. No acute vascular finding. Coronary atherosclerosis. Mediastinum/Nodes: Mild nodal prominence considered reactive in this setting. Lungs/Pleura: There is generalized airway thickening. Mild patchy nodular and airspace opacities in the right middle and lower lobes. Patchy ground-glass density in the bilateral apex. There  is dependent atelectasis and septal thickening with trace pleural effusions. Mild emphysema Upper Abdomen: Cholecystectomy which accounts for common bile duct prominence where visualized. Bilateral partially covered renal cystic densities. Musculoskeletal: No acute finding. IMPRESSION: 1. Multifocal pneumonia. 2. Mild interstitial edema with trace pleural effusions and  pericardial effusion. 3. Lower lobe atelectasis. 4. Mild emphysema. Electronically Signed   By: Monte Fantasia M.D.   On: 05/05/2018 05:46   Nm Myocar Multi W/spect W/wall Motion / Ef  Result Date: 05/10/2018 CLINICAL DATA:  73 year old male with new onset heart failure EXAM: MYOCARDIAL IMAGING WITH SPECT (REST AND PHARMACOLOGIC-STRESS) GATED LEFT VENTRICULAR WALL MOTION STUDY LEFT VENTRICULAR EJECTION FRACTION TECHNIQUE: Standard myocardial SPECT imaging was performed after resting intravenous injection of 10 mCi Tc-51msestamibi. Subsequently, intravenous infusion of Lexiscan was performed under the supervision of the Cardiology staff. At peak effect of the drug, 30 mCi Tc-948mestamibi was injected intravenously and standard myocardial SPECT imaging was performed. Quantitative gated imaging was also performed to evaluate left ventricular wall motion, and estimate left ventricular ejection fraction. COMPARISON:  Chest x-ray 05/10/2018 FINDINGS: Perfusion: No evidence of progressive decreased radiotracer uptake following stress. There are areas of fixed decreased uptake in the antero septum and inferolateral walls in the mid ventricle and apex. Wall Motion: Left ventricular dilatation and global hypokinesis. Left Ventricular Ejection Fraction: 28 % End diastolic volume 24277l End systolic volume 18824l IMPRESSION: 1. No focal reversible ischemia. Patchy areas of fixed defect in the anteroseptal and inferolateral walls of the mid ventricle and apex which may represent areas of prior infarct/scarring. 2. Left ventricular dilatation with global hypokinesis. 3. Left ventricular ejection fraction 28% 4. Non invasive risk stratification*: High *2012 Appropriate Use Criteria for Coronary Revascularization Focused Update: J Am Coll Cardiol. 202353;61(4):[email or web address]://[email or web address] Electronically Signed   By: HeJacqulynn Cadet.D.   On: 05/10/2018 15:02   Dg Chest Port 1  View  Result Date: 05/10/2018 CLINICAL DATA:  Shortness of breath and confusion. EXAM: PORTABLE CHEST 1 VIEW COMPARISON:  05/09/2018 FINDINGS: Worsening infiltrate throughout the left lung and at the right lung base. No dense consolidation or lobar collapse. IMPRESSION: Worsening of infiltrate throughout the left lung consistent with bronchopneumonia. Mild patchy density also now seen at the right lung base. Electronically Signed   By: MaNelson Chimes.D.   On: 05/10/2018 08:05   Dg Chest Port 1 View  Result Date: 05/09/2018 CLINICAL DATA:  Shortness of breath. History of post inflammatory pulmonary fibrosis. EXAM: PORTABLE CHEST 1 VIEW COMPARISON:  05/08/2018. FINDINGS: Mild lingular and bibasilar opacities, likely reflecting atelectasis, less likely mild infection. No frank interstitial edema. No pleural effusions or pneumothorax. The heart is normal in size. Mild degenerative changes of the mid thoracic spine. IMPRESSION: Mild lingular and bibasilar opacities, likely atelectasis, less likely mild infection. Electronically Signed   By: SrJulian Hy.D.   On: 05/09/2018 08:35   Dg Chest Port 1 View  Result Date: 05/08/2018 CLINICAL DATA:  Shortness of breath, pneumonia EXAM: PORTABLE CHEST 1 VIEW COMPARISON:  05/07/2018 FINDINGS: Mild left basilar opacity, favoring atelectasis. Right lung is clear. No frank interstitial edema. No pleural effusion or pneumothorax. The heart is normal in size. IMPRESSION: Mild left basilar opacity, favoring atelectasis. Electronically Signed   By: SrJulian Hy.D.   On: 05/08/2018 07:43   Dg Chest Port 1 View  Result Date: 05/07/2018 CLINICAL DATA:  89ear old male with shortness of breath EXAM: PORTABLE CHEST 1 VIEW COMPARISON:  Prior chest x-ray 05/05/2018 FINDINGS:  Cardiomegaly, stable compared to prior imaging. Mediastinal contours are within normal limits. No evidence of pulmonary edema, pleural effusion, pneumothorax or focal airspace consolidation.  Slight interval improvement of perihilar interstitial infiltrates suggest resolution of mild edema. Otherwise, the background bronchitic changes are similar. Trace bilateral pleural effusions and bibasilar atelectasis. No acute osseous abnormality. IMPRESSION: 1. Improving perihilar interstitial airspace opacities favors resolved interstitial edema. 2. Stable trace bilateral pleural effusions and bibasilar atelectasis. 3. Stable cardiomegaly. Electronically Signed   By: Jacqulynn Cadet M.D.   On: 05/07/2018 08:57   Dg Chest Portable 1 View  Result Date: 05/05/2018 CLINICAL DATA:  Shortness of breath EXAM: PORTABLE CHEST 1 VIEW COMPARISON:  05/31/2017 FINDINGS: Mild cardiac enlargement. New perihilar infiltration could represent edema or multifocal pneumonia. No blunting of costophrenic angles. No pneumothorax. Mediastinal contours appear intact. IMPRESSION: New perihilar infiltration suggesting edema or multifocal pneumonia. Cardiac enlargement. Electronically Signed   By: Lucienne Capers M.D.   On: 05/05/2018 04:02   Vas [name] Le Art Seg Multi (segm&le Reynauds)  Result Date: 05/04/2018 LOWER EXTREMITY DOPPLER STUDY Indications: Problematic painful, thickened and elongated toenails. High Risk Factors: Hypertension, past history of smoking. Other Factors: Barely palpable tibial pulses, bilaterally, as well as decreased                capillary refill time to all toes. Skin on the calves and feet is                shiny and thin.  Comparison Study: None Performing Technologist: [employer] BS, RVT, RDCS  Examination Guidelines: A complete evaluation includes at minimum, Doppler waveform signals and systolic blood pressure reading at the level of bilateral brachial, anterior tibial, and posterior tibial arteries, when vessel segments are accessible. Bilateral testing is considered an integral part of a complete examination. Photoelectric Plethysmograph (PPG) waveforms and toe systolic pressure readings are  included as required and additional duplex testing as needed. Limited examinations for reoccurring indications may be performed as noted.  ABI Findings: +---------+------------------+-----+---------+--------+  Right     Rt Pressure (mmHg) Index Waveform  Comment   +---------+------------------+-----+---------+--------+  Brachial  118                                          +---------+------------------+-----+---------+--------+  CFA                                triphasic           +---------+------------------+-----+---------+--------+  Popliteal                          triphasic           +---------+------------------+-----+---------+--------+  ATA       139                1.18  triphasic           +---------+------------------+-----+---------+--------+  PTA       144                1.22  triphasic           +---------+------------------+-----+---------+--------+  PERO      144                1.22  triphasic           +---------+------------------+-----+---------+--------+  Great Toe 143                1.21  Normal              +---------+------------------+-----+---------+--------+ +---------+------------------+-----+---------+-------+  Left      Lt Pressure (mmHg) Index Waveform  Comment  +---------+------------------+-----+---------+-------+  Brachial  113                                         +---------+------------------+-----+---------+-------+  CFA                                triphasic          +---------+------------------+-----+---------+-------+  Popliteal                          triphasic          +---------+------------------+-----+---------+-------+  ATA       142                1.20  triphasic          +---------+------------------+-----+---------+-------+  PTA       136                1.15  triphasic          +---------+------------------+-----+---------+-------+  PERO      137                1.16  triphasic          +---------+------------------+-----+---------+-------+  Great Toe 144                 1.22  Normal             +---------+------------------+-----+---------+-------+ +-------+-----------+-----------+------------+------------+  ABI/TBI Today's ABI Today's TBI Previous ABI Previous TBI  +-------+-----------+-----------+------------+------------+  Right   1.22        1.21                                   +-------+-----------+-----------+------------+------------+  Left    1.2         1.22                                   +-------+-----------+-----------+------------+------------+ TOES Findings: +----------+---------------+--------+-------+  Right Toes Pressure (mmHg) Waveform Comment  +----------+---------------+--------+-------+  1st Digit  143             Normal            +----------+---------------+--------+-------+  2nd Digit                  Normal            +----------+---------------+--------+-------+  3rd Digit                  Normal            +----------+---------------+--------+-------+  4th Digit                  Normal            +----------+---------------+--------+-------+  5th Digit                  Normal            +----------+---------------+--------+-------+ +---------+---------------+--------+-------+  Left Toes Pressure (mmHg) Waveform Comment  +---------+---------------+--------+-------+  1st Digit 144             Normal            +---------+---------------+--------+-------+  2nd Digit                 Normal            +---------+---------------+--------+-------+  3rd Digit                 Normal            +---------+---------------+--------+-------+  4th Digit                 Normal            +---------+---------------+--------+-------+  5th Digit                 Normal            +---------+---------------+--------+-------+   Summary: Right: Resting right ankle-brachial index is within normal range. No evidence of significant right lower extremity arterial disease. The right toe-brachial index is normal. RT great toe pressure = 143 mmHg. Left: Resting left  ankle-brachial index is within normal range. No evidence of significant left lower extremity arterial disease. The left toe-brachial index is normal. LT Great toe pressure = 144 mmHg.  *See table(s) above for measurements and observations.  Electronically signed by Kathlyn Sacramento MD on 05/04/2018 at 5:10:29 PM.    Final     Assessment/Plan  1. Impacted cerumen of left ear cerumen impaction.ear lavaged with warm water.large amounts of cerumen removed using alligator forceps on left ear. - Ear Lavage  2. Impacted cerumen of right ear Reports worsening hearing.Ear lavaged with warm water.Small amounts removed using curette unable to completely remove some impeded cerumen in right ear will refer to ENT   - Ear Lavage  3. Chronic combined systolic and diastolic congestive heart failure (HCC) No edema,cough,shortness of breath or wheezing. Continue on Imdur 30 mg tablet daily and hydralazine 25 mg tablet every 8 hours and metoprolol 50 mg tablet daily.Discussed importance of checking weight daily at home.continue 1500 ml fluid restriction daily.Follow up with Cardiology as directed. - DG Chest 2 View; Future  4. Essential hypertension, benign B/p stable.continue current medication. - CBC with Differential/Platelet; Future - CMP with eGFR([facility]); Future  5. Chronic kidney disease (CKD), stage III (moderate) (HCC) Worsening BUN/CR during hospital admission. - CMP with eGFR([facility]); Future  6. Hypomagnesemia Low mg level in hospital.Will recheck today.  - Magnesium; Future  7. 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 Continue on monthly Procrit injections and Ferrous sulfate 325 mg tabalet twice daily.  8. Decreased hearing, right Still has impeded cerumen after ear lavage unable to remove with instrument TM not visualized.  - Ambulatory referral to ENT for further evaluation.   Family/ staff Communication: Reviewed plan of care  with patient.   Labs/tests ordered: None   Brent Dock C Ulices Maack, NP

## 2018-05-12 ENCOUNTER — Emergency Department (HOSPITAL_COMMUNITY)
Admission: EM | Admit: 2018-05-12 | Discharge: 2018-05-12 | Disposition: A | Discharge status: Home / Self Care | Payer: Medicare Other | Attending: Emergency Medicine | Admitting: Emergency Medicine

## 2018-05-12 ENCOUNTER — Telehealth: Payer: Self-pay | Admitting: *Deleted

## 2018-05-12 ENCOUNTER — Emergency Department (HOSPITAL_COMMUNITY): Payer: Medicare Other

## 2018-05-12 ENCOUNTER — Other Ambulatory Visit: Payer: Self-pay

## 2018-05-12 ENCOUNTER — Encounter (HOSPITAL_COMMUNITY): Payer: Self-pay

## 2018-05-12 DIAGNOSIS — R069 Unspecified abnormalities of breathing: Secondary | ICD-10-CM | POA: Diagnosis not present

## 2018-05-12 DIAGNOSIS — I509 Heart failure, unspecified: Secondary | ICD-10-CM

## 2018-05-12 DIAGNOSIS — N183 Chronic kidney disease, stage 3 (moderate): Secondary | ICD-10-CM | POA: Diagnosis not present

## 2018-05-12 DIAGNOSIS — R0689 Other abnormalities of breathing: Secondary | ICD-10-CM | POA: Diagnosis not present

## 2018-05-12 DIAGNOSIS — I130 Hypertensive heart and chronic kidney disease with heart failure and stage 1 through stage 4 chronic kidney disease, or unspecified chronic kidney disease: Secondary | ICD-10-CM | POA: Insufficient documentation

## 2018-05-12 DIAGNOSIS — R0902 Hypoxemia: Secondary | ICD-10-CM | POA: Diagnosis not present

## 2018-05-12 DIAGNOSIS — Z87891 Personal history of nicotine dependence: Secondary | ICD-10-CM | POA: Insufficient documentation

## 2018-05-12 DIAGNOSIS — I5023 Acute on chronic systolic (congestive) heart failure: Secondary | ICD-10-CM | POA: Diagnosis not present

## 2018-05-12 DIAGNOSIS — J80 Acute respiratory distress syndrome: Secondary | ICD-10-CM | POA: Diagnosis not present

## 2018-05-12 DIAGNOSIS — R0602 Shortness of breath: Secondary | ICD-10-CM | POA: Diagnosis not present

## 2018-05-12 LAB — BASIC METABOLIC PANEL
Anion gap: 8 (ref 5–15)
BUN: 17 mg/dL (ref 8–23)
CO2: 26 mmol/L (ref 22–32)
Calcium: 8.6 mg/dL — ABNORMAL LOW (ref 8.9–10.3)
Chloride: 108 mmol/L (ref 98–111)
Creatinine, Ser: 1.77 mg/dL — ABNORMAL HIGH (ref 0.61–1.24)
GFR calc Af Amer: 43 mL/min — ABNORMAL LOW (ref >60)
GFR calc non Af Amer: 37 mL/min — ABNORMAL LOW (ref >60)
Glucose, Bld: 120 mg/dL — ABNORMAL HIGH (ref 70–99)
Potassium: 4.1 mmol/L (ref 3.5–5.1)
Sodium: 142 mmol/L (ref 135–145)

## 2018-05-12 LAB — CBC WITH DIFFERENTIAL/PLATELET
ABS IMMATURE GRANULOCYTES: 0.03 10*3/uL (ref 0.00–0.07)
Basophils Absolute: 0 10*3/uL (ref 0.0–0.1)
Basophils Relative: 0 %
Eosinophils Absolute: 0.3 10*3/uL (ref 0.0–0.5)
Eosinophils Relative: 3 %
HCT: 34.6 % — ABNORMAL LOW (ref 39.0–52.0)
Hemoglobin: 10 g/dL — ABNORMAL LOW (ref 13.0–17.0)
Immature Granulocytes: 0 %
Lymphocytes Relative: 38 %
Lymphs Abs: 4 10*3/uL (ref 0.7–4.0)
MCH: 32.3 pg (ref 26.0–34.0)
MCHC: 28.9 g/dL — ABNORMAL LOW (ref 30.0–36.0)
MCV: 111.6 fL — ABNORMAL HIGH (ref 80.0–100.0)
Monocytes Absolute: 1 10*3/uL (ref 0.1–1.0)
Monocytes Relative: 9 %
NEUTROS PCT: 50 %
Neutro Abs: 5.3 10*3/uL (ref 1.7–7.7)
Platelets: 166 10*3/uL (ref 150–400)
RBC: 3.1 MIL/uL — ABNORMAL LOW (ref 4.22–5.81)
RDW: 13.9 % (ref 11.5–15.5)
WBC: 10.6 10*3/uL — ABNORMAL HIGH (ref 4.0–10.5)
nRBC: 0 % (ref 0.0–0.2)

## 2018-05-12 LAB — BRAIN NATRIURETIC PEPTIDE: B Natriuretic Peptide: 1145.7 pg/mL — ABNORMAL HIGH (ref 0.0–100.0)

## 2018-05-12 MED ORDER — AEROCHAMBER PLUS FLO-VU MEDIUM MISC
1.0000 | Freq: Once | Status: AC
  Administered 2018-05-12: 1

## 2018-05-12 MED ORDER — ALBUTEROL SULFATE (2.5 MG/3ML) 0.083% IN NEBU
5.0000 mg | INHALATION_SOLUTION | Freq: Once | RESPIRATORY_TRACT | Status: AC
  Administered 2018-05-12: 5 mg via RESPIRATORY_TRACT
  Filled 2018-05-12: qty 6

## 2018-05-12 MED ORDER — ALBUTEROL SULFATE HFA 108 (90 BASE) MCG/ACT IN AERS
2.0000 | INHALATION_SPRAY | RESPIRATORY_TRACT | Status: DC | PRN
  Administered 2018-05-12: 2 via RESPIRATORY_TRACT
  Filled 2018-05-12: qty 6.7

## 2018-05-12 MED ORDER — FUROSEMIDE 10 MG/ML IJ SOLN
40.0000 mg | Freq: Once | INTRAMUSCULAR | Status: AC
  Administered 2018-05-12: 40 mg via INTRAVENOUS
  Filled 2018-05-12: qty 4

## 2018-05-12 MED ORDER — FUROSEMIDE 20 MG PO TABS: 20.0000 mg | ORAL_TABLET | Freq: Two times a day (BID) | ORAL | 0 refills | Status: DC

## 2018-05-12 NOTE — ED Triage Notes (Signed)
Pt arrived via EMS on CPAP.  Initial SPO2 78%, NRB brought it to 84% upon ems arrival  CPAP and albuterol, +125 mg Solu-edrol

## 2018-05-12 NOTE — ED Provider Notes (Signed)
[facility] EMERGENCY DEPARTMENT Provider Note   CSN: [id number] Arrival date & time: 05/12/18  7619    History   Chief Complaint Chief Complaint  Patient presents with   Shortness of Breath    HPI CAUY MELODY is a 73 y.o. male.     The history is provided by the patient and medical records. No language interpreter was used.  Shortness of Breath     73 year old male recently diagnosed with new onset of heart failure in the setting of pneumonia presenting with complaints of shortness of breath.  Patient was initially seen a week ago in the ED and subsequently admitted to the hospital with pneumonia and new onset CHF.  He received antibiotic as well as medication adjustment and was discharged 2 days ago.  Patient discharged home with azithromycin, and Lasix.  He was also discharged home with nebulizer.  Cardiology also saw patients on isosorbide mononitrate as well as hydralazine.  Patient states when he went home, he somehow misplaced his nebulizer applicator and have not had access to it.  Due to that, he endorsed progressive worsening shortness of breath with any activity.  Endorse some dry cough but denies having fever, chest pain, abdominal pain, nausea vomiting diarrhea.  Symptoms worsened this morning to come here.  Past Medical History:  Diagnosis Date   Anemia    Anxiety    Benign paroxysmal positional vertigo    Bipolar I disorder, most recent episode (or current) unspecified    Celiac disease    Cellulitis and abscess of unspecified site    Chronic kidney disease, stage III (moderate) (HCC)    Crohn's    Remicade q8 weeks   Depression    Dizziness and giddiness    Essential and other specified forms of tremor    medication-induced Parkinson's, now resolved   Essential hypertension, benign    Gout 2018   Hypertrophy of prostate without urinary obstruction and other lower urinary tract symptoms (LUTS)    Hypopotassemia     Impotence of organic origin    Insomnia with sleep apnea, unspecified    Iron deficiency anemia, unspecified    Loss of weight    Malnutrition of mild degree (HCC)    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   Neuralgia, neuritis, and radiculitis, unspecified    Other B-complex deficiencies    Other extrapyramidal disease and abnormal movement disorder    Postinflammatory pulmonary fibrosis ([place])    Tobacco use disorder    Vertigo 2018   Viral warts, unspecified     Patient Active Problem List   Diagnosis Date Noted   Acute systolic CHF (congestive heart failure) ([place])    Acute respiratory failure with hypoxia ([place]) 05/05/2018   Multifocal pneumonia 05/05/2018   Flash pulmonary edema ([place]) 05/05/2018   Candida esophagitis ([place]) 09/22/2017   History of smoking 30 or more pack years 01/05/2017   Bipolar 1 disorder ([place])    BPH (benign prostatic hyperplasia) 03/31/2013   Anemia in chronic kidney disease 03/31/2013   Essential hypertension, benign 03/31/2013   Chronic kidney disease (CKD), stage III (moderate) ([place]) 06/04/2012   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 Authorizing Provider  albuterol (PROVENTIL) (2.5 MG/3ML) 0.083% nebulizer  solution Take 3 mLs (2.5 mg total) by nebulization every 4 (four) hours as needed for wheezing or shortness of breath. 05/10/18   Raiford Noble Latif, DO  allopurinol (ZYLOPRIM) 100 MG tablet Take 2 tablets (200 mg total) by mouth daily. 04/10/16   Lauree Chandler, NP  Calcium Carbonate-Vit D-Min 600-400 MG-UNIT TABS Take 1 tablet by mouth 3 (three) times daily.     [provider]  cefdinir (OMNICEF) 300 MG capsule Take 1 capsule (300 mg total) by mouth every 12 (twelve) hours. 05/11/18   Raiford Noble  Latif, DO  cholecalciferol (VITAMIN D) 1000 units tablet Take 1,000 Units by mouth daily.     [provider]  divalproex (DEPAKOTE) 500 MG EC tablet Take 1,500 mg by mouth at bedtime.     [provider]  epoetin alfa (PROCRIT) 26333 UNIT/ML injection Inject 10,000 Units into the skin every 30 (thirty) days.     [provider]  ferrous sulfate 325 (65 FE) MG tablet Take 325 mg by mouth 2 (two) times daily.      [provider]  hydrALAZINE (APRESOLINE) 25 MG tablet Take 1 tablet (25 mg total) by mouth every 8 (eight) hours. 05/10/18   Sheikh, Omair Latif, DO  inFLIXimab (REMICADE) 100 MG injection Infuse Remicade IV schedule 1 26m/kg every 8 weeks Premedicate with Tylenol 500-6544mby mouth and Benadryl 25-5029my mouth prior to infusion. Last PPD was on 12/2009.  11/22/10   JacMilus BanisterD  isosorbide mononitrate (IMDUR) 30 MG 24 hr tablet Take 1 tablet (30 mg total) by mouth daily. 05/11/18   SheRaiford Nobletif, DO  Loperamide HCl (LOPERAMIDE A-D PO) Take 1 capsule by mouth as needed (loose stools).     [provider]  magnesium oxide (MAG-OX) 400 (241.3 Mg) MG tablet Take 2,400 mg by mouth 3 (three) times daily.  04/17/17   [provider]  meclizine (ANTIVERT) 25 MG tablet Take 25 mg by mouth 3 (three) times daily as needed for dizziness.  06/21/16   [provider]  metoprolol succinate (TOPROL-XL) 50 MG 24 hr tablet Take 50 mg by mouth daily. Take with or immediately following a meal.    [provider]  Multiple Vitamin (MULTIVITAMIN) tablet Take 1 tablet by mouth daily.      [provider]  OLANZapine (ZYPREXA) 7.5 MG tablet Take 1 tablet (7.5 mg total) by mouth at bedtime. 01/03/16   Reed, Tiffany L, DO  omeprazole (PRILOSEC) 20 MG capsule Take 20 mg by mouth daily before supper.      [provider]  polyethylene glycol (MIRALAX / GLYCOLAX) packet Take 17 g by mouth daily as needed for mild  constipation. 05/10/18   SheRaiford Nobletif, DO  prazosin (MINIPRESS) 5 MG capsule Take 5 mg by mouth at bedtime.    [provider]  vitamin E 400 UNIT capsule Take 400 Units by mouth daily.      [provider]    Family History Family History  Problem Relation Age of Onset   Diabetes Mother        maternal grandmother   Uterine cancer Mother    Emphysema Father    Pneumonia Maternal Grandmother    Colon cancer Neg Hx     Social History Social History   Tobacco Use   Smoking status: Former Smoker    Packs/day: 1.00    Years: 49.00    Pack years: 49.00    Types: Cigarettes  Start date: 04/11/1956    Last attempt to quit: 04/17/2014    Years since quitting: 4.0   Smokeless tobacco: Never Used  Substance Use Topics   Alcohol use: No    Alcohol/week: 0.0 standard drinks   Drug use: No     Allergies   Azathioprine; Ciprofloxacin; Levaquin [levofloxacin in d5w]; and Plendil [felodipine]   Review of Systems Review of Systems  Respiratory: Positive for shortness of breath.   All other systems reviewed and are negative.    Physical Exam Updated Vital Signs There were no vitals taken for this visit.  Physical Exam Vitals signs and nursing note reviewed.  Constitutional:      General: He is not in acute distress.    Appearance: He is well-developed.  HENT:     Head: Atraumatic.  Eyes:     Conjunctiva/sclera: Conjunctivae normal.  Neck:     Musculoskeletal: Neck supple.  Cardiovascular:     Rate and Rhythm: Tachycardia present.  Pulmonary:     Effort: Pulmonary effort is normal.     Breath sounds: Decreased breath sounds, wheezing and rales present.  Chest:     Chest wall: No tenderness.  Musculoskeletal:     Right lower leg: No edema.     Left lower leg: No edema.  Skin:    Findings: No rash.  Neurological:     Mental Status: He is alert and oriented to person, place, and time.  Psychiatric:        Mood and Affect: Mood  normal.      ED Treatments / Results  Labs (all labs ordered are listed, but only abnormal results are displayed) Labs Reviewed  BASIC METABOLIC PANEL - Abnormal; Notable for the following components:      Result Value   Glucose, Bld 120 (*)    Creatinine, Ser 1.77 (*)    Calcium 8.6 (*)    GFR calc non Af Amer 37 (*)    GFR calc Af Amer 43 (*)    All other components within normal limits  CBC WITH DIFFERENTIAL/PLATELET - Abnormal; Notable for the following components:   WBC 10.6 (*)    RBC 3.10 (*)    Hemoglobin 10.0 (*)    HCT 34.6 (*)    MCV 111.6 (*)    MCHC 28.9 (*)    All other components within normal limits  BRAIN NATRIURETIC PEPTIDE - Abnormal; Notable for the following components:   B Natriuretic Peptide 1,145.7 (*)    All other components within normal limits    EKG EKG Interpretation  Date/Time:  Wednesday May 12 2018 06:24:58 EDT Ventricular Rate:  115 PR Interval:    QRS Duration: 84 QT Interval:  299 QTC Calculation: 414 R Axis:   38 Text Interpretation:  Sinus tachycardia Probable anterior infarct, age indeterminate No significant change since last tracing Confirmed by Ward, Cyril Mourning [phone]) on 05/12/2018 6:28:41 AM     Radiology Nm Myocar Multi W/spect W/wall Motion / Ef  Result Date: 05/10/2018 CLINICAL DATA:  73 year old male with new onset heart failure EXAM: MYOCARDIAL IMAGING WITH SPECT (REST AND PHARMACOLOGIC-STRESS) GATED LEFT VENTRICULAR WALL MOTION STUDY LEFT VENTRICULAR EJECTION FRACTION TECHNIQUE: Standard myocardial SPECT imaging was performed after resting intravenous injection of 10 mCi Tc-16msestamibi. Subsequently, intravenous infusion of Lexiscan was performed under the supervision of the Cardiology staff. At peak effect of the drug, 30 mCi Tc-932mestamibi was injected intravenously and standard myocardial SPECT imaging was performed. Quantitative gated imaging was also performed to  evaluate left ventricular wall motion, and estimate  left ventricular ejection fraction. COMPARISON:  Chest x-ray 05/10/2018 FINDINGS: Perfusion: No evidence of progressive decreased radiotracer uptake following stress. There are areas of fixed decreased uptake in the antero septum and inferolateral walls in the mid ventricle and apex. Wall Motion: Left ventricular dilatation and global hypokinesis. Left Ventricular Ejection Fraction: 28 % End diastolic volume 008 ml End systolic volume 676 ml IMPRESSION: 1. No focal reversible ischemia. Patchy areas of fixed defect in the anteroseptal and inferolateral walls of the mid ventricle and apex which may represent areas of prior infarct/scarring. 2. Left ventricular dilatation with global hypokinesis. 3. Left ventricular ejection fraction 28% 4. Non invasive risk stratification*: High *2012 Appropriate Use Criteria for Coronary Revascularization Focused Update: J Am Coll Cardiol. 1950;93(2):671-245. http://content.airportbarriers.com.aspx?articleid=[email or web address] Electronically Signed   By: Jacqulynn Cadet M.D.   On: 05/10/2018 15:02   Dg Chest Port 1 View  Result Date: 05/10/2018 CLINICAL DATA:  Shortness of breath and confusion. EXAM: PORTABLE CHEST 1 VIEW COMPARISON:  05/09/2018 FINDINGS: Worsening infiltrate throughout the left lung and at the right lung base. No dense consolidation or lobar collapse. IMPRESSION: Worsening of infiltrate throughout the left lung consistent with bronchopneumonia. Mild patchy density also now seen at the right lung base. Electronically Signed   By: Nelson Chimes M.D.   On: 05/10/2018 08:05    Procedures Procedures (including critical care time)  Medications Ordered in ED Medications  albuterol (PROVENTIL HFA;VENTOLIN HFA) 108 (90 Base) MCG/ACT inhaler 2 puff (has no administration in time range)  albuterol (PROVENTIL) (2.5 MG/3ML) 0.083% nebulizer solution 5 mg (5 mg Nebulization Given 05/12/18 0747)  furosemide (LASIX) injection 40 mg (40 mg Intravenous Given 05/12/18 0828)      Initial Impression / Assessment and Plan / ED Course  I have reviewed the triage vital signs and the nursing notes.  Pertinent labs & imaging results that were available during my care of the patient were reviewed by me and considered in my medical decision making (see chart for details).        BP (!) 150/81    Pulse (!) 109    Resp 19    Ht 6' 4"  (1.93 m)    Wt 74.9 kg    SpO2 99%    BMI 20.09 kg/m    Final Clinical Impressions(s) / ED Diagnoses   Final diagnoses:  Acute on chronic congestive heart failure, unspecified heart failure type [facility])    ED Discharge Orders         Ordered    furosemide (LASIX) 20 MG tablet  2 times daily     05/12/18 1121         7:09 AM Pt here with increased SOB, but likely due to misplacing his nebulizer. He does have decreased breathsounds and has rales.  No report of any chest pain, fever or worsening cough. Pt placed on BiPap, labs ordered.  Will monitor closely.   8:22 AM Pt currently not on Lasix yet. BNP worsen today to 1,145.7.  CXR shows improvement. Will give Lasix 44m IV.  Pt currently receiving BiPAP and felt better.  Will ambulate and check O2.  If pt does well, [facility]  Care discussed with Dr. LVanita Panda   10:46 AM Pt felt good when ambulating.  O2 sats 92% on RA, however HR is 122.  Recent chest CT without evidence of PE.   11:18 AM Appreciate consultation with  cardiologist Dr. Burt Knack who will help coordinate close f/u with heart failure clinic for further management of pt's CHF.  Plan to d/c with Lasix 53m BID for the next week while waiting to be seen at HF clinic.  Albuterol MDI with spacer given as needed.  Return precaution discussed.    TDomenic Moras PA-C 05/12/18 1123    LCarmin Muskrat MD 05/14/18 [phone]

## 2018-05-12 NOTE — Discharge Instructions (Signed)
Use albuterol inhaler 2 puffs every 4 hours as needed for shortness of breath. Take Lasix as prescribed.  The Heart Failure clinic will call and schedule a close follow up appointment.  Return if your condition worsen or if you have other concerns.

## 2018-05-12 NOTE — Progress Notes (Signed)
Pt taken off BiPAP and placed on 2L [place].  Tolerating well at this time.

## 2018-05-12 NOTE — Telephone Encounter (Signed)
I have made the 1st attempt to contact the patient or family member in charge, in order to follow up from recently being discharged from the hospital. I left a message on voicemail but I will make another attempt at a different time.  

## 2018-05-12 NOTE — ED Notes (Signed)
Pt ambulated with HR 122, O2 sats 92% on RA, will continue to monitor

## 2018-05-13 NOTE — Telephone Encounter (Signed)
Transition Care Management Follow-up Telephone Call  Date of discharge and from where: 05/10/2018 [place]  How have you been since you were released from the hospital? Patient stated a lot better  Any questions or concerns? No   Items Reviewed:  Did the pt receive and understand the discharge instructions provided? Yes   Medications obtained and verified? Yes   Any new allergies since your discharge? No   Dietary orders reviewed? Yes  Do you have support at home? Yes   Other (ie: DME, Home Health, etc) Home Health for PT  Functional Questionnaire: (I = Independent and D = Dependent) ADL's: I  Bathing/Dressing- I   Meal Prep- I  Eating- I  Maintaining continence- I  Transferring/Ambulation- I  Managing Meds- I   Follow up appointments reviewed:    PCP Hospital f/u appt confirmed? Yes  Scheduled to see Dr. Mariea Clonts on 05/17/2018 .  [facility] f/u appt confirmed? Yes  .  Are transportation arrangements needed? No   If their condition worsens, is the pt aware to call  their PCP or go to the ED? Yes  Was the patient provided with contact information for the PCP's office or ED? Yes  Was the pt encouraged to call back with questions or concerns? Yes

## 2018-05-13 NOTE — Telephone Encounter (Signed)
I have made the 2nd attempt to contact the patient or family member in charge, in order to follow up from recently being discharged from the hospital. I left a message on voicemail but I will make another attempt at a different time.  

## 2018-05-16 NOTE — Progress Notes (Deleted)
Cardiology Office Note   Date:  05/16/2018   ID:  Brent, [name] Jul 20, 1945, MRN [id number]  PCP:  Gayland Curry, DO  Cardiologist:  Dr. Martinique No chief complaint on file.    History of Present Illness: Brent [name] is a 73 y.o. male who presents for post hospital follow up after admission for acute on chronic systolic CHF. He has a history of CKD disease stage III, COPD, Tobacco abuse,Bipolar Disorder, and Crohn's disease.Dr. Martinique noted that the patient had not been seen by a cardiologist before. He is followed by the [facility] clinic  On arrival he was in respiratory distress and required BiPAP. CXR revealed multifocal pneumonia and cardiac enlargement. Echo demonstrated moderate severity reduced systolic function, with EF of 35%. He admitted to daily intake of high salt foods.   He was diuresed with IV lasix, was not placed on ACE or ARB due to renal function,. He was found to have a small amount of coronary atherosclerosis in the diagonal branch per CT of the chest. Amlodipine was discontinued and isosorbide 30 mg /hydralazine 25 mg TID was started.   He has polymorphic VT, and a NM study was completed and read by Dr. Sallyanne Kuster, who felt that due to mild and patchy perfusion changes, that do not appear to follow typical CAD distribution, cardiac cath was not recommended. Instead, medical management of NICM was planned. He remained on Toprol 50 mg XL daily.   He was treated for pneumonia by Hospitalist team. He was started on Azithromycin and Rocephin. He was immunocompromised due to prior Remicade infusions, and was placed on hydrocortisone 50 mg for short term. Discharge weight, 169 lbs.  He unfortunately returned to the ED on 05/12/2018 two days after discharge. He was given IV lasix again and discharged. He was to continue current discharge management, to include no salt and fluid restriction of 1500 ml per day anf lasix 20 mg BID. He is here for close follow up.   Past Medical History:   Diagnosis Date  . Anemia   . Anxiety   . Benign paroxysmal positional vertigo   . Bipolar I disorder, most recent episode (or current) unspecified   . Celiac disease   . Cellulitis and abscess of unspecified site   . Chronic kidney disease, stage III (moderate) (HCC)   . Crohn's    Remicade q8 weeks  . Depression   . Dizziness and giddiness   . Essential and other specified forms of tremor    medication-induced Parkinson's, now resolved  . Essential hypertension, benign   . Gout 2018  . Hypertrophy of prostate without urinary obstruction and other lower urinary tract symptoms (LUTS)   . Hypopotassemia   . Impotence of organic origin   . Insomnia with sleep apnea, unspecified   . Iron deficiency anemia, unspecified   . Loss of weight   . Malnutrition of mild degree ([place])   . 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 Neuralgia, neuritis, and radiculitis, unspecified   . Other B-complex deficiencies   . Other extrapyramidal disease and abnormal movement disorder   . Postinflammatory pulmonary fibrosis ([place])   . Tobacco use disorder   . Vertigo 2018  . Viral warts, unspecified     Past Surgical History:  Procedure Laterality Date  . CHOLECYSTECTOMY  07-12-2010  . SMALL INTESTINE SURGERY     x 2     Current Outpatient Medications  Medication Sig Dispense Refill  . albuterol (PROVENTIL) (2.5 MG/3ML) 0.083% nebulizer solution Take 3  mLs (2.5 mg total) by nebulization every 4 (four) hours as needed for wheezing or shortness of breath. 75 mL 12  . allopurinol (ZYLOPRIM) 100 MG tablet Take 2 tablets (200 mg total) by mouth daily. 60 tablet 5  . Calcium Carbonate-Vit D-Min 600-400 MG-UNIT TABS Take 1 tablet by mouth 3 (three) times daily.     . cefdinir (OMNICEF) 300 MG capsule Take 1 capsule (300 mg total) by mouth every 12 (twelve) hours. (Patient not taking: Reported on 05/12/2018) 2 capsule 0  . cholecalciferol (VITAMIN D) 1000 units tablet Take 1,000 Units by mouth daily.     . divalproex  (DEPAKOTE) 500 MG EC tablet Take 1,500 mg by mouth at bedtime.     Marland Kitchen epoetin alfa (PROCRIT) 67209 UNIT/ML injection Inject 10,000 Units into the skin every 30 (thirty) days.     . ferrous sulfate 325 (65 FE) MG tablet Take 325 mg by mouth 2 (two) times daily.      . furosemide (LASIX) 20 MG tablet Take 1 tablet (20 mg total) by mouth 2 (two) times daily. 30 tablet 0  . hydrALAZINE (APRESOLINE) 25 MG tablet Take 1 tablet (25 mg total) by mouth every 8 (eight) hours. 90 tablet 0  . inFLIXimab (REMICADE) 100 MG injection Infuse Remicade IV schedule 1 27m/kg every 8 weeks Premedicate with Tylenol 500-6573mby mouth and Benadryl 25-5063my mouth prior to infusion. Last PPD was on 12/2009.  1 each 6  . isosorbide mononitrate (IMDUR) 30 MG 24 hr tablet Take 1 tablet (30 mg total) by mouth daily. 30 tablet 0  . Loperamide HCl (LOPERAMIDE A-D PO) Take 1 capsule by mouth as needed (loose stools).     . magnesium oxide (MAG-OX) 400 (241.3 Mg) MG tablet Take 2,400 mg by mouth 3 (three) times daily.   3  . meclizine (ANTIVERT) 25 MG tablet Take 25 mg by mouth 3 (three) times daily as needed for dizziness.   0  . metoprolol succinate (TOPROL-XL) 50 MG 24 hr tablet Take 50 mg by mouth daily. Take with or immediately following a meal.    . Multiple Vitamin (MULTIVITAMIN) tablet Take 1 tablet by mouth daily.      . OMarland KitchenANZapine (ZYPREXA) 7.5 MG tablet Take 1 tablet (7.5 mg total) by mouth at bedtime. 30 tablet 11  . omeprazole (PRILOSEC) 20 MG capsule Take 20 mg by mouth daily before supper.      . polyethylene glycol (MIRALAX / GLYCOLAX) packet Take 17 g by mouth daily as needed for mild constipation. 14 each 0  . prazosin (MINIPRESS) 5 MG capsule Take 5 mg by mouth at bedtime.    . vitamin E 400 UNIT capsule Take 400 Units by mouth daily.       No current facility-administered medications for this visit.     Allergies:   Azathioprine; Ciprofloxacin; Levaquin [levofloxacin in d5w]; and Plendil [felodipine]     Social History:  The patient  reports that he quit smoking about 4 years ago. His smoking use included cigarettes. He started smoking about 62 years ago. He has a 49.00 pack-year smoking history. He has never used smokeless tobacco. He reports that he does not drink alcohol or use drugs.   Family History:  The patient's family history includes Diabetes in his mother; Emphysema in his father; Pneumonia in his maternal grandmother; Uterine cancer in his mother.    ROS: All other systems are reviewed and negative. Unless otherwise mentioned in H&P    PHYSICAL EXAM:  VS:  There were no vitals taken for this visit. , BMI There is no height or weight on file to calculate BMI. GEN: Well nourished, well developed, in no acute distress HEENT: normal Neck: no JVD, carotid bruits, or masses Cardiac: ***RRR; no murmurs, rubs, or gallops,no edema  Respiratory:  Clear to auscultation bilaterally, normal work of breathing GI: soft, nontender, nondistended, + BS MS: no deformity or atrophy Skin: warm and dry, no rash Neuro:  Strength and sensation are intact Psych: euthymic mood, full affect   EKG:  EKG {ACTION; IS/IS [id number]} ordered today. The ekg ordered today demonstrates ***   Recent Labs: 05/07/2018: TSH 0.202 05/10/2018: ALT 8; Magnesium 1.8 05/12/2018: B Natriuretic Peptide 1,145.7; BUN 17; Creatinine, Ser 1.77; Hemoglobin 10.0; Platelets 166; Potassium 4.1; Sodium 142    Lipid Panel    Component Value Date/Time   CHOL 138 May 16, 2017 0903   CHOL 137 08/10/2015 1322   TRIG 176 (H) May 16, 2017 0903   HDL 42 05/16/2017 0903   HDL 52 08/10/2015 1322   CHOLHDL 3.3 05-16-2017 0903   LDLCALC 71 05/16/17 0903      Wt Readings from Last 3 Encounters:  05/12/18 165 lb 1 oz (74.9 kg)  05/11/18 169 lb (76.7 kg)  05/10/18 166 lb 0.1 oz (75.3 kg)      Other studies Reviewed: Echocardiogram 2018-05-17 1. The left ventricle has moderate-severely reduced systolic function, with an  ejection fraction of 30-35%. The cavity size was moderate to severely dilated. There is mild concentric left ventricular hypertrophy. Left ventricular diastolic Doppler  parameters are consistent with impaired relaxation. Left ventricular diffuse hypokinesis.  2. The right ventricle has normal systolic function. The cavity was normal. There is no increase in right ventricular wall thickness.  3. The aortic valve is tricuspid Aortic valve regurgitation is trivial by color flow Doppler.  4. The inferior vena cava was normal in size with <50% respiratory variability.  NM Study 05/10/2018 IMPRESSION: 1. No focal reversible ischemia. Patchy areas of fixed defect in the anteroseptal and inferolateral walls of the mid ventricle and apex which may represent areas of prior infarct/scarring.  2. Left ventricular dilatation with global hypokinesis.  3. Left ventricular ejection fraction 28%  4. Non invasive risk stratification*: High  ASSESSMENT AND PLAN:  1.  ***   Current medicines are reviewed at length with the patient today.    Labs/ tests ordered today include: *** Phill Myron. [place] Pugh, ANP, AACC   05/16/2018 2:28 PM    [place] [address] Office [phone] Fax [phone]

## 2018-05-17 ENCOUNTER — Ambulatory Visit: Payer: Medicare Other | Admitting: Adult Health

## 2018-05-17 ENCOUNTER — Encounter: Payer: Medicare Other | Admitting: Internal Medicine

## 2018-05-18 ENCOUNTER — Encounter: Payer: Self-pay | Admitting: *Deleted

## 2018-05-21 ENCOUNTER — Telehealth (HOSPITAL_COMMUNITY): Payer: Self-pay | Admitting: Cardiology

## 2018-05-21 NOTE — Telephone Encounter (Signed)
Patient left message to schedule HFU  Detailed message left for to return call to schedule virtual visit via WebEx or telephone as we are not having patients return in the office at this time

## 2018-05-26 NOTE — Telephone Encounter (Signed)
Spoke with patient, pt reports feeling well currently.  He is due for hospital f/u telehealth visit. appt made for tomorrow. Pt amenable to date and time.

## 2018-05-27 ENCOUNTER — Other Ambulatory Visit: Payer: Self-pay

## 2018-05-27 ENCOUNTER — Ambulatory Visit (HOSPITAL_COMMUNITY)
Admission: RE | Admit: 2018-05-27 | Discharge: 2018-05-27 | Disposition: A | Discharge status: Home / Self Care | Payer: Medicare Other | Source: Ambulatory Visit | Attending: Cardiology | Admitting: Cardiology

## 2018-05-27 DIAGNOSIS — I5022 Chronic systolic (congestive) heart failure: Secondary | ICD-10-CM | POA: Diagnosis not present

## 2018-05-27 DIAGNOSIS — N183 Chronic kidney disease, stage 3 unspecified: Secondary | ICD-10-CM

## 2018-05-27 MED ORDER — ISOSORBIDE MONONITRATE ER 30 MG PO TB24: 30.0000 mg | ORAL_TABLET | Freq: Every day | ORAL | 0 refills | Status: DC

## 2018-05-27 MED ORDER — HYDRALAZINE HCL 25 MG PO TABS: 25.0000 mg | ORAL_TABLET | Freq: Three times a day (TID) | ORAL | 0 refills | Status: DC

## 2018-05-27 MED ORDER — METOPROLOL SUCCINATE ER 50 MG PO TB24: 50.0000 mg | ORAL_TABLET | Freq: Every day | ORAL | 2 refills | Status: DC

## 2018-05-27 MED ORDER — FUROSEMIDE 40 MG PO TABS: 40.0000 mg | ORAL_TABLET | Freq: Every day | ORAL | 2 refills | Status: DC

## 2018-05-27 NOTE — Addendum Note (Signed)
Encounter addended by: Marlise Eves, RN on: 05/27/2018 10:41 AM  Actions taken: Clinical Note Signed

## 2018-05-27 NOTE — Progress Notes (Signed)
Called pt to review avs. Called both lines. Left voicemail on both lines. avs to be printed and sent along with script for bmet to be sent via mail.

## 2018-05-27 NOTE — Addendum Note (Signed)
Encounter addended by: Marlise Eves, RN on: 05/27/2018 10:33 AM  Actions taken: Diagnosis association updated, Order list changed, Clinical Note Signed

## 2018-05-27 NOTE — Patient Instructions (Addendum)
Lab work will need to be done next week at [place] will be mailed a prescription to have filled at any [facility].  REFILLED all cardiac medicines.  CANCELLED [place] appointment  Please follow up with the Advanced Practice Provider in 2 weeks via telephone call   Your physician has requested that you have an echocardiogram. Echocardiography is a painless test that uses sound waves to create images of your heart. It provides your doctor with information about the size and shape of your heart and how well your heart's chambers and valves are working. This procedure takes approximately one hour. There are no restrictions for this procedure. This will be done at your appointment with Dr. [name] in 3 months.  Please follow up with Dr. [name] in 3 months with an echocardiogram. Someone will contact you in order to schedule that appointment.

## 2018-05-27 NOTE — Progress Notes (Signed)
Heart Failure TeleHealth Note  Due to national recommendations of social distancing due to [place] 19, telehealth visit is felt to be most appropriate for this patient at this time.  I discussed the limitations, risks, security and privacy concerns of performing an evaluation and management service by telephone and the availability of in person appointments. I also discussed with the patient that there may be a patient responsible charge related to this service. The patient expressed understanding and agreed to proceed.   ID:  Brent [name], DOB [birth year], MRN [id number]  Location: Home  Provider location: [address] [place] Type of Visit: New patient, post hospital follow up  PCP:  Gayland Curry, DO  Cardiologist:  Peter Martinique, MD Primary HF: Dr [name]  Chief Complaint: Chronic systolic HF   History of Present Illness: Brent [name] is a 73 y.o. male with a history of Crohn's disease, celiac disease, well controlled HTN, COPD, prior tobacco use, CKD 3, chronic anemia, and bipolar disorder. Newly diagnosed with systolic HF (10/5619 EF 30-86%).   Admitted 3/11-3/16/20 with SOB and hypoxia. Found to have PNA and required BIPAP and antibiotics. Blood cultures were negative. Echo showed newly reduced EF 30-35%. Cardiology consulted and he had nuclear stress test, which was read as "high risk" due to low EF. Per Dr Sallyanne Kuster, did not think changes were indicative of coronary disease and cath was not pursued due this and due to CKD. HF meds optimized. Limited with CKD3. He had one short run of NSVT. Also had AKI on CKD3, but creatinine improved to baseline 1.77 on day of discharge.  He was seen in ED 3/18 with SOB after misplacing his nebulizer. BNP was elevated, so he was given 40 mg IV lasix and discharged with lasix 20 mg BID. Referred to HF clinic.   He presents via Psychiatric nurse for a telehealth visit today. Overall doing a lot better. Denies SOB moving around the house.  He doesn't have stairs. BLE edema much improved, can see his veins now. Sleeps with 2 flat pillows at baseline. No PND. No cough, fever, chills, or sweats. No CP, dizziness, or palpitations. Great UOP with lasix with clear. Not sure if he snores, but feels well rested. No ETOH use. No hx of AF/flutter. Taking all medications, but ran out of lasix yesterday. Tries to limits fluid and salt intake. Had chicken gizzards the other day. Gets food from grocery store himself. Has not been weighing daily, but has a scale somewhere. BP machine is broken.   Family: maternal grandmother and grandfather died with CHF in 59s.   Social: Lives alone, Meds through [place], No ETOH or tobacco.   Pt denies symptoms of cough, fevers, chills, or new SOB worrisome for COVID 19.   Past Medical History:  Diagnosis Date   Anemia    Anxiety    Benign paroxysmal positional vertigo    Bipolar I disorder, most recent episode (or current) unspecified    Celiac disease    Cellulitis and abscess of unspecified site    Chronic kidney disease, stage III (moderate) (HCC)    Crohn's    Remicade q8 weeks   Depression    Dizziness and giddiness    Essential and other specified forms of tremor    medication-induced Parkinson's, now resolved   Essential hypertension, benign    Gout 2018   Hypertrophy of prostate without urinary obstruction and other lower urinary tract symptoms (LUTS)    Hypopotassemia  Impotence of organic origin    Insomnia with sleep apnea, unspecified    Iron deficiency anemia, unspecified    Loss of weight    Malnutrition of mild degree (HCC)    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   Neuralgia, neuritis, and radiculitis, unspecified    Other B-complex deficiencies    Other extrapyramidal disease and abnormal movement disorder    Postinflammatory pulmonary fibrosis (HCC)    Tobacco use disorder    Vertigo 2018   Viral warts, unspecified    Past Surgical History:  Procedure Laterality  Date   CHOLECYSTECTOMY  07-12-2010   SMALL INTESTINE SURGERY     x 2     Current Outpatient Medications  Medication Sig Dispense Refill   albuterol (PROVENTIL) (2.5 MG/3ML) 0.083% nebulizer solution Take 3 mLs (2.5 mg total) by nebulization every 4 (four) hours as needed for wheezing or shortness of breath. 75 mL 12   allopurinol (ZYLOPRIM) 100 MG tablet Take 2 tablets (200 mg total) by mouth daily. 60 tablet 5   Calcium Carbonate-Vit D-Min 600-400 MG-UNIT TABS Take 1 tablet by mouth 3 (three) times daily.      cholecalciferol (VITAMIN D) 1000 units tablet Take 1,000 Units by mouth daily.      divalproex (DEPAKOTE) 500 MG EC tablet Take 1,500 mg by mouth at bedtime.      epoetin alfa (PROCRIT) 01779 UNIT/ML injection Inject 10,000 Units into the skin every 30 (thirty) days.      ferrous sulfate 325 (65 FE) MG tablet Take 325 mg by mouth 2 (two) times daily.       furosemide (LASIX) 20 MG tablet Take 1 tablet (20 mg total) by mouth 2 (two) times daily. 30 tablet 0   hydrALAZINE (APRESOLINE) 25 MG tablet Take 1 tablet (25 mg total) by mouth every 8 (eight) hours. 90 tablet 0   inFLIXimab (REMICADE) 100 MG injection Infuse Remicade IV schedule 1 51m/kg every 8 weeks Premedicate with Tylenol 500-6524mby mouth and Benadryl 25-5097my mouth prior to infusion. Last PPD was on 12/2009.  1 each 6   isosorbide mononitrate (IMDUR) 30 MG 24 hr tablet Take 1 tablet (30 mg total) by mouth daily. 30 tablet 0   Loperamide HCl (LOPERAMIDE A-D PO) Take 1 capsule by mouth as needed (loose stools).      magnesium oxide (MAG-OX) 400 (241.3 Mg) MG tablet Take 2,400 mg by mouth 3 (three) times daily.   3   meclizine (ANTIVERT) 25 MG tablet Take 25 mg by mouth 3 (three) times daily as needed for dizziness.   0   metoprolol succinate (TOPROL-XL) 50 MG 24 hr tablet Take 50 mg by mouth daily. Take with or immediately following a meal.     Multiple Vitamin (MULTIVITAMIN) tablet Take 1 tablet by  mouth daily.       OLANZapine (ZYPREXA) 7.5 MG tablet Take 1 tablet (7.5 mg total) by mouth at bedtime. 30 tablet 11   omeprazole (PRILOSEC) 20 MG capsule Take 20 mg by mouth daily before supper.       polyethylene glycol (MIRALAX / GLYCOLAX) packet Take 17 g by mouth daily as needed for mild constipation. 14 each 0   prazosin (MINIPRESS) 5 MG capsule Take 5 mg by mouth at bedtime.     vitamin E 400 UNIT capsule Take 400 Units by mouth daily.       No current facility-administered medications for this encounter.     Allergies:   Azathioprine; Ciprofloxacin; Levaquin [levofloxacin in d5w]; and  Plendil [felodipine]   Social History:  The patient  reports that he quit smoking about 4 years ago. His smoking use included cigarettes. He started smoking about 62 years ago. He has a 49.00 pack-year smoking history. He has never used smokeless tobacco. He reports that he does not drink alcohol or use drugs.   Family History:  The patient's family history includes Diabetes in his mother; Emphysema in his father; Pneumonia in his maternal grandmother; Uterine cancer in his mother.   ROS:  Please see the history of present illness.   All other systems are personally reviewed and negative.   Exam:  [facility] Call) Lungs: Normal respiratory effort with conversation.  Neuro: Alert & oriented x 3.   Recent Labs: 05/07/2018: TSH 0.202 05/10/2018: ALT 8; Magnesium 1.8 05/12/2018: B Natriuretic Peptide 1,145.7; BUN 17; Creatinine, Ser 1.77; Hemoglobin 10.0; Platelets 166; Potassium 4.1; Sodium 142  Personally reviewed   Wt Readings from Last 3 Encounters:  05/12/18 74.9 kg (165 lb 1 oz)  05/11/18 76.7 kg (169 lb)  05/10/18 75.3 kg (166 lb 0.1 oz)      ASSESSMENT AND PLAN:  1.  Chronic systolic HF (new as of 03/4823). 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 ?viral. HTN well controlled. Narrow QRS. No hx of AF. Not sure if he snores. He has some family hx of CHF. - Echo 05/05/2018: EF 30-35%, mild concentric LVH,  diffuse HK, RV normal, trivial AI - Stress test 05/10/18: High risk, EF 28%. Per Dr Sallyanne Kuster, did not think mild and patchy perfusion changes were indicative of coronary disease. 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 Cath not pursued due to no CP and CKD - NYHA II-III. Volume sounds stable.  - Continue lasix 40 mg daily. Check BMET - Continue hydral 25 mg TID + imdur 30 mg daily - Continue Toprol XL 50 mg daily - If BMET stable, will plan to add spiro. - Repeat echo in 3-4 months. Discussed possibility of EP referral for ICD if EF remains low.  - Discussed limiting fluid and salt intake and importance of daily weights. He is going to find his scale and start weighing.  - I will cancel his heartcare appointment next week per patient request - he is only following with them for CHF.   2. Tobacco use - Remains quit.   3. CKD3 - Baseline looks to be 1.8-2.1 - Creatinine stable 1.77 in ED on 3/18. - Check BMET at [facility]/PCP office with addition of lasix.   His son is his 32. He would be in charge of making medical decisions for him in the event that pt was unable to make decisions himself. They have spoken about his wishes.   COVID screen The patient does not have any symptoms that suggest any further testing/ screening at this time.  Social distancing reinforced today.  Patient Risk: After full review of this patients clinical status, I feel that they are at moderate risk for cardiac decompensation at this time.  Follow up in 2 weeks by telephone. Will schedule echo in 3 months with Dr [name]. Will provide refills on HF medications.   Today, I have spent 30 minutes with the patient with telehealth technology discussing heart failure, fluid restriction, salt restriction, and daily weights.    Signed, Georgiana Shore, NP  05/27/2018 9:32 AM  [facility] [address] Heart and [place] [address] [phone] (office) [phone] (fax)

## 2018-05-28 ENCOUNTER — Other Ambulatory Visit (HOSPITAL_COMMUNITY): Payer: Self-pay

## 2018-05-28 MED ORDER — HYDRALAZINE HCL 25 MG PO TABS: 25.0000 mg | ORAL_TABLET | Freq: Three times a day (TID) | ORAL | 2 refills | Status: DC

## 2018-05-28 MED ORDER — ISOSORBIDE MONONITRATE ER 30 MG PO TB24: 30.0000 mg | ORAL_TABLET | Freq: Every day | ORAL | 1 refills | Status: DC

## 2018-05-28 NOTE — Addendum Note (Signed)
Addended by: Valeda Malm on: 05/28/2018 10:26 AM   Modules accepted: Orders

## 2018-05-31 ENCOUNTER — Encounter (HOSPITAL_COMMUNITY): Payer: Self-pay

## 2018-05-31 ENCOUNTER — Encounter (HOSPITAL_COMMUNITY)
Admission: RE | Admit: 2018-05-31 | Discharge: 2018-05-31 | Disposition: A | Discharge status: Home / Self Care | Payer: Medicare Other | Source: Ambulatory Visit | Attending: Gastroenterology | Admitting: Gastroenterology

## 2018-05-31 ENCOUNTER — Other Ambulatory Visit: Payer: Self-pay

## 2018-05-31 ENCOUNTER — Ambulatory Visit: Payer: Medicare Other | Admitting: Physician Assistant

## 2018-05-31 DIAGNOSIS — N183 Chronic kidney disease, stage 3 (moderate): Secondary | ICD-10-CM | POA: Insufficient documentation

## 2018-05-31 DIAGNOSIS — D631 Anemia in chronic kidney disease: Secondary | ICD-10-CM | POA: Diagnosis not present

## 2018-05-31 LAB — IRON AND TIBC
Iron: 45 ug/dL (ref 45–182)
Saturation Ratios: 19 % (ref 17.9–39.5)
TIBC: 235 ug/dL — ABNORMAL LOW (ref 250–450)
UIBC: 190 ug/dL

## 2018-05-31 LAB — FERRITIN: Ferritin: 657 ng/mL — ABNORMAL HIGH (ref 24–336)

## 2018-05-31 LAB — HEMOGLOBIN: Hemoglobin: 11.3 g/dL — ABNORMAL LOW (ref 13.0–17.0)

## 2018-05-31 MED ORDER — EPOETIN ALFA 10000 UNIT/ML IJ SOLN
10000.0000 [IU] | INTRAMUSCULAR | Status: DC
  Administered 2018-05-31: 13:00:00 10000 [IU] via SUBCUTANEOUS

## 2018-05-31 MED ORDER — EPOETIN ALFA 10000 UNIT/ML IJ SOLN
INTRAMUSCULAR | Status: AC
  Administered 2018-05-31: 10000 [IU] via SUBCUTANEOUS
  Filled 2018-05-31: qty 1

## 2018-06-02 DIAGNOSIS — K50918 Crohn's disease, unspecified, with other complication: Secondary | ICD-10-CM | POA: Diagnosis not present

## 2018-06-03 ENCOUNTER — Ambulatory Visit (INDEPENDENT_AMBULATORY_CARE_PROVIDER_SITE_OTHER): Payer: Medicare Other | Admitting: Internal Medicine

## 2018-06-03 ENCOUNTER — Other Ambulatory Visit: Payer: Self-pay

## 2018-06-03 ENCOUNTER — Encounter: Payer: Self-pay | Admitting: Internal Medicine

## 2018-06-03 DIAGNOSIS — N183 Chronic kidney disease, stage 3 unspecified: Secondary | ICD-10-CM

## 2018-06-03 DIAGNOSIS — I130 Hypertensive heart and chronic kidney disease with heart failure and stage 1 through stage 4 chronic kidney disease, or unspecified chronic kidney disease: Secondary | ICD-10-CM

## 2018-06-03 DIAGNOSIS — F319 Bipolar disorder, unspecified: Secondary | ICD-10-CM

## 2018-06-03 DIAGNOSIS — I5042 Chronic combined systolic (congestive) and diastolic (congestive) heart failure: Secondary | ICD-10-CM

## 2018-06-03 DIAGNOSIS — J8410 Pulmonary fibrosis, unspecified: Secondary | ICD-10-CM

## 2018-06-03 DIAGNOSIS — J181 Lobar pneumonia, unspecified organism: Secondary | ICD-10-CM

## 2018-06-03 DIAGNOSIS — I10 Essential (primary) hypertension: Secondary | ICD-10-CM

## 2018-06-03 DIAGNOSIS — D631 Anemia in chronic kidney disease: Secondary | ICD-10-CM

## 2018-06-03 NOTE — Progress Notes (Signed)
Patient ID: Brent [name], male   DOB: [birth year], 73 y.o.   MRN: [id number] This service is provided via telemedicine  No vital signs collected/recorded due to the encounter was a telemedicine visit.   Location of patient (ex: home, work): SHOP[place]  Patient consents to a telephone visit:  YES  Location of the provider (ex: office, home):  OFFICE  Name of any referring provider:  Nizhoni Parlow, DO  Names of all persons participating in the telemedicine service and their role in the encounter:  Brent [name], PATIENT, Brent [name], [place], [place], DO   Time spent on call:  5:45    Provider:  Cayman Kielbasa L. Mariea [name], D.O., C.M.D.  Code Status: FULL CODE--pt has never been able to make a decision about this or discuss with family so we could document it though I have brought it up a few times  Goals of Care:  Advanced Directives 06/03/2018  Does Patient Have a Medical Advance Directive? No  Type of Advance Directive -  Does patient want to make changes to medical advance directive? No - Patient declined  Copy of [place] in Chart? -  Would patient like information on creating a medical advance directive? No - Patient declined  Pre-existing out of facility DNR order (yellow form or pink MOST form) -     Chief Complaint  Patient presents with  . Medical Management of Chronic Issues    f/u after hospitalizations in march with pneumonia and chf    HPI: Patient is a 73 y.o. male with Crohn's on remicade, remote smoking, bipolar disorder and CKD spoken with today for medical management of chronic diseases.    I haven't seen him since his hospitalization from 3/11-3/16 with acute respiratory failure with hypoxia complicated by CHF.  He was very sob, wheezing and sweating, not coughing.  He was put on bipap.  After IVFs, he developed pulmonary edema and required IV lasix.  Echo showed systolic and diastolic chf.  Amlodipine was stopped and he was put on isosorbide/hydralazine  instead.  He had been on supplemental O2 but got weaned off the day before discharge. He had some polymorphic VT.  Dr. Loletha Grayer did not think that the "high risk" changes on his stress test were consistent with cad.  He was transitioned to po abx and 3-6 wk f/u cxr and labs recommended.  His report on his hospitalization:  He thought he'd leave here--he was wheezing and couldn't breathe at all.  EMS didn't even ask if he wanted to go to the hospital--they just took him.    Now:  Not short of breath.  No pain.  No chest pain.  Just a little swelling--unchanged.    He has an appt 4/27 for TOC, but reviewed today.  He actually saw cardiology already by virtual visit.  He is weighing himself daily--it fluctuates only by 1 lb.  He ordered a machine from the [place] to keep up with his blood pressures.    He has all of his medications now but admits he was not taking them properly before his admission.    We discussed his risk of covid-19--he is immunosuppressed due to his crohn's medication.  I counseled him on mask use when out and good handwashing, staying at home as much as possible.  Past Medical History:  Diagnosis Date  . Anemia   . Anxiety   . Benign paroxysmal positional vertigo   . Bipolar I disorder, most recent episode (or current) unspecified   .  Celiac disease   . Cellulitis and abscess of unspecified site   . Chronic kidney disease, stage III (moderate) (HCC)   . Crohn's    Remicade q8 weeks  . Depression   . Dizziness and giddiness   . Essential and other specified forms of tremor    medication-induced Parkinson's, now resolved  . Essential hypertension, benign   . Gout 2018  . Hypertrophy of prostate without urinary obstruction and other lower urinary tract symptoms (LUTS)   . Hypopotassemia   . Impotence of organic origin   . Insomnia with sleep apnea, unspecified   . Iron deficiency anemia, unspecified   . Loss of weight   . Malnutrition of mild degree ([place])   . 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 Neuralgia, neuritis, and radiculitis, unspecified   . Other B-complex deficiencies   . Other extrapyramidal disease and abnormal movement disorder   . Postinflammatory pulmonary fibrosis ([place])   . Tobacco use disorder   . Vertigo 2018  . Viral warts, unspecified     Past Surgical History:  Procedure Laterality Date  . CHOLECYSTECTOMY  07-12-2010  . SMALL INTESTINE SURGERY     x 2    Allergies  Allergen Reactions  . Azathioprine     REACTION: affected WBC  . Ciprofloxacin     Pt denies any reaction  . Levaquin [Levofloxacin In D5w]     Pt denies any reaction  . Plendil [Felodipine]     Pt denies any reaction    Outpatient Encounter Medications as of 06/03/2018  Medication Sig  . allopurinol (ZYLOPRIM) 100 MG tablet Take 2 tablets (200 mg total) by mouth daily.  . Calcium Carbonate-Vit D-Min 600-400 MG-UNIT TABS Take 1 tablet by mouth 3 (three) times daily.   . cholecalciferol (VITAMIN D) 1000 units tablet Take 1,000 Units by mouth daily.   . divalproex (DEPAKOTE) 500 MG EC tablet Take 1,500 mg by mouth at bedtime.   Marland Kitchen epoetin alfa (PROCRIT) 51025 UNIT/ML injection Inject 10,000 Units into the skin every 30 (thirty) days.   . ferrous sulfate 325 (65 FE) MG tablet Take 325 mg by mouth 2 (two) times daily.    . furosemide (LASIX) 40 MG tablet Take 1 tablet (40 mg total) by mouth daily.  . hydrALAZINE (APRESOLINE) 25 MG tablet Take 1 tablet (25 mg total) by mouth every 8 (eight) hours.  . inFLIXimab (REMICADE) 100 MG injection Infuse Remicade IV schedule 1 51m/kg every 8 weeks Premedicate with Tylenol 500-6541mby mouth and Benadryl 25-5069my mouth prior to infusion. Last PPD was on 12/2009.   . isosorbide mononitrate (IMDUR) 30 MG 24 hr tablet Take 1 tablet (30 mg total) by mouth daily.  . Loperamide HCl (LOPERAMIDE A-D PO) Take 1 capsule by mouth as needed (loose stools).   . magnesium oxide (MAG-OX) 400 (241.3 Mg) MG tablet Take 2,400 mg by mouth 3 (three) times  daily.   . meclizine (ANTIVERT) 25 MG tablet Take 25 mg by mouth 3 (three) times daily as needed for dizziness.   . metoprolol succinate (TOPROL-XL) 50 MG 24 hr tablet Take 1 tablet (50 mg total) by mouth daily. Take with or immediately following a meal.  . Multiple Vitamin (MULTIVITAMIN) tablet Take 1 tablet by mouth daily.    . OMarland KitchenANZapine (ZYPREXA) 7.5 MG tablet Take 1 tablet (7.5 mg total) by mouth at bedtime.  . oMarland Kitcheneprazole (PRILOSEC) 20 MG capsule Take 20 mg by mouth daily before supper.    . polyethylene glycol (MIRALAX /  GLYCOLAX) packet Take 17 g by mouth daily as needed for mild constipation.  . prazosin (MINIPRESS) 5 MG capsule Take 5 mg by mouth at bedtime.  . vitamin E 400 UNIT capsule Take 400 Units by mouth daily.    . [DISCONTINUED] albuterol (PROVENTIL) (2.5 MG/3ML) 0.083% nebulizer solution Take 3 mLs (2.5 mg total) by nebulization every 4 (four) hours as needed for wheezing or shortness of breath.   No facility-administered encounter medications on file as of 06/03/2018.     Review of Systems:  Review of Systems  Constitutional: Negative for chills, fever and malaise/fatigue.       No weight gain--stable within a lb  HENT: Positive for hearing loss.   Respiratory: Negative for cough, sputum production, shortness of breath and wheezing.   Cardiovascular: Positive for leg swelling. Negative for chest pain and palpitations.       Mild but stable by his report  Gastrointestinal: Negative for abdominal pain, constipation and melena.       Crohn's  Genitourinary: Negative for dysuria.  Musculoskeletal: Negative for falls and joint pain.  Skin: Negative for itching and rash.  Neurological: Negative for dizziness and loss of consciousness.  Psychiatric/Behavioral: Negative for depression and memory loss. The patient is not nervous/anxious and does not have insomnia.     Health Maintenance  Topic Date Due  . Hepatitis C Screening  01/29/2019 (Originally 18-Mar-1945)  . INFLUENZA  VACCINE  09/25/2018  . TETANUS/TDAP  05/25/2021  . COLONOSCOPY  09/17/2027  . PNA vac Low Risk Adult  Completed    Physical Exam: Could not be performed as visit non face-to-face via phone   Labs reviewed: Basic Metabolic Panel: Recent Labs    02/10/18 1618  05/07/18 0336 05/08/18 0256 05/09/18 0251 05/10/18 0330 05/12/18 0629  NA  --    < > 141 141 143 142 142  K  --    < > 3.9 3.9 3.9 4.1 4.1  CL  --    < > 108 108 112* 112* 108  CO2  --    < > 25 26 27 25 26   GLUCOSE  --    < > 100* 99 95 86 120*  BUN  --    < > 25* 28* 26* 20 17  CREATININE  --    < > 1.90* 2.13* 1.86* 1.82* 1.77*  CALCIUM  --    < > 8.6* 8.5* 8.1* 8.4* 8.6*  MG  --    < > 1.4* 1.9 1.8 1.8  --   PHOS  --    < > 2.7 3.0 3.1 3.0  --   TSH 2.66  --  0.202*  --   --   --   --    < > = values in this interval not displayed.   Liver Function Tests: Recent Labs    05/08/18 0256 05/09/18 0251 05/10/18 0330  AST 12* 11* 12*  ALT 7 8 8   ALKPHOS 47 50 45  BILITOT 0.6 0.4 0.6  PROT 5.8* 5.4* 5.4*  ALBUMIN 2.5* 2.5* 2.4*   Recent Labs    06/18/17 1214  LIPASE 31   No results for input(s): AMMONIA in the last 8760 hours. CBC: Recent Labs    05/09/18 0251 05/10/18 0330 05/12/18 0629 05/31/18 1230  WBC 8.6 9.4 10.6*  --   NEUTROABS 5.0 6.1 5.3  --   HGB 8.8* 9.1* 10.0* 11.3*  HCT 28.3* 29.4* 34.6*  --   MCV 106.8* 107.3* 111.6*  --  PLT 156 153 166  --    Lipid Panel: No results for input(s): CHOL, HDL, LDLCALC, TRIG, CHOLHDL, LDLDIRECT in the last 8760 hours. Lab Results  Component Value Date   HGBA1C 4.9 08/10/2015    Procedures since last visit: Ct Chest Wo Contrast  Result Date: 05/05/2018 CLINICAL DATA:  Shortness of breath.  Abnormal chest x-ray EXAM: CT CHEST WITHOUT CONTRAST TECHNIQUE: Multidetector CT imaging of the chest was performed following the standard protocol without IV contrast. COMPARISON:  Chest x-ray from earlier today.  01/20/2017 chest CT FINDINGS:  Cardiovascular: No cardiomegaly. Small pericardial effusion that is slightly increased. No acute vascular finding. Coronary atherosclerosis. Mediastinum/Nodes: Mild nodal prominence considered reactive in this setting. Lungs/Pleura: There is generalized airway thickening. Mild patchy nodular and airspace opacities in the right middle and lower lobes. Patchy ground-glass density in the bilateral apex. There is dependent atelectasis and septal thickening with trace pleural effusions. Mild emphysema Upper Abdomen: Cholecystectomy which accounts for common bile duct prominence where visualized. Bilateral partially covered renal cystic densities. Musculoskeletal: No acute finding. IMPRESSION: 1. Multifocal pneumonia. 2. Mild interstitial edema with trace pleural effusions and pericardial effusion. 3. Lower lobe atelectasis. 4. Mild emphysema. Electronically Signed   By: Monte Fantasia M.D.   On: 05/05/2018 05:46   Nm Myocar Multi W/spect W/wall Motion / Ef  Result Date: 05/10/2018 CLINICAL DATA:  73 year old male with new onset heart failure EXAM: MYOCARDIAL IMAGING WITH SPECT (REST AND PHARMACOLOGIC-STRESS) GATED LEFT VENTRICULAR WALL MOTION STUDY LEFT VENTRICULAR EJECTION FRACTION TECHNIQUE: Standard myocardial SPECT imaging was performed after resting intravenous injection of 10 mCi Tc-80msestamibi. Subsequently, intravenous infusion of Lexiscan was performed under the supervision of the Cardiology staff. At peak effect of the drug, 30 mCi Tc-971mestamibi was injected intravenously and standard myocardial SPECT imaging was performed. Quantitative gated imaging was also performed to evaluate left ventricular wall motion, and estimate left ventricular ejection fraction. COMPARISON:  Chest x-ray 05/10/2018 FINDINGS: Perfusion: No evidence of progressive decreased radiotracer uptake following stress. There are areas of fixed decreased uptake in the antero septum and inferolateral walls in the mid ventricle and  apex. Wall Motion: Left ventricular dilatation and global hypokinesis. Left Ventricular Ejection Fraction: 28 % End diastolic volume 24226l End systolic volume 18333l IMPRESSION: 1. No focal reversible ischemia. Patchy areas of fixed defect in the anteroseptal and inferolateral walls of the mid ventricle and apex which may represent areas of prior infarct/scarring. 2. Left ventricular dilatation with global hypokinesis. 3. Left ventricular ejection fraction 28% 4. Non invasive risk stratification*: High *2012 Appropriate Use Criteria for Coronary Revascularization Focused Update: J Am Coll Cardiol. 205456;25(6):389-373http://content.[email or web address]?articleid=[email or web address] Electronically Signed   By: HeJacqulynn Cadet.D.   On: 05/10/2018 15:02   Dg Chest Portable 1 View  Result Date: 05/12/2018 CLINICAL DATA:  Increasing shortness of breath EXAM: PORTABLE CHEST 1 VIEW COMPARISON:  11/10/2018 FINDINGS: Cardiomegaly. Interstitial coarsening without Kerley lines or air bronchogram. No effusion or pneumothorax. IMPRESSION: Cardiomegaly and vascular congestion. Improved aeration when compared to study 2 days ago. Electronically Signed   By: JoMonte Fantasia.D.   On: 05/12/2018 06:53   Dg Chest Port 1 View  Result Date: 05/10/2018 CLINICAL DATA:  Shortness of breath and confusion. EXAM: PORTABLE CHEST 1 VIEW COMPARISON:  05/09/2018 FINDINGS: Worsening infiltrate throughout the left lung and at the right lung base. No dense consolidation or lobar collapse. IMPRESSION: Worsening of infiltrate throughout the left lung consistent with bronchopneumonia. Mild patchy density also now seen at the  right lung base. Electronically Signed   By: Nelson Chimes M.D.   On: 05/10/2018 08:05   Dg Chest Port 1 View  Result Date: 05/09/2018 CLINICAL DATA:  Shortness of breath. History of post inflammatory pulmonary fibrosis. EXAM: PORTABLE CHEST 1 VIEW COMPARISON:  05/08/2018. FINDINGS: Mild lingular and bibasilar  opacities, likely reflecting atelectasis, less likely mild infection. No frank interstitial edema. No pleural effusions or pneumothorax. The heart is normal in size. Mild degenerative changes of the mid thoracic spine. IMPRESSION: Mild lingular and bibasilar opacities, likely atelectasis, less likely mild infection. Electronically Signed   By: Julian Hy M.D.   On: 05/09/2018 08:35   Dg Chest Port 1 View  Result Date: 05/08/2018 CLINICAL DATA:  Shortness of breath, pneumonia EXAM: PORTABLE CHEST 1 VIEW COMPARISON:  05/07/2018 FINDINGS: Mild left basilar opacity, favoring atelectasis. Right lung is clear. No frank interstitial edema. No pleural effusion or pneumothorax. The heart is normal in size. IMPRESSION: Mild left basilar opacity, favoring atelectasis. Electronically Signed   By: Julian Hy M.D.   On: 05/08/2018 07:43   Dg Chest Port 1 View  Result Date: 05/07/2018 CLINICAL DATA:  73 year old male with shortness of breath EXAM: PORTABLE CHEST 1 VIEW COMPARISON:  Prior chest x-ray 05/05/2018 FINDINGS: Cardiomegaly, stable compared to prior imaging. Mediastinal contours are within normal limits. No evidence of pulmonary edema, pleural effusion, pneumothorax or focal airspace consolidation. Slight interval improvement of perihilar interstitial infiltrates suggest resolution of mild edema. Otherwise, the background bronchitic changes are similar. Trace bilateral pleural effusions and bibasilar atelectasis. No acute osseous abnormality. IMPRESSION: 1. Improving perihilar interstitial airspace opacities favors resolved interstitial edema. 2. Stable trace bilateral pleural effusions and bibasilar atelectasis. 3. Stable cardiomegaly. Electronically Signed   By: Jacqulynn Cadet M.D.   On: 05/07/2018 08:57   Dg Chest Portable 1 View  Result Date: 05/05/2018 CLINICAL DATA:  Shortness of breath EXAM: PORTABLE CHEST 1 VIEW COMPARISON:  05/31/2017 FINDINGS: Mild cardiac enlargement. New perihilar  infiltration could represent edema or multifocal pneumonia. No blunting of costophrenic angles. No pneumothorax. Mediastinal contours appear intact. IMPRESSION: New perihilar infiltration suggesting edema or multifocal pneumonia. Cardiac enlargement. Electronically Signed   By: Lucienne Capers M.D.   On: 05/05/2018 04:02    Assessment/Plan 1. Chronic combined systolic and diastolic congestive heart failure (HCC) -stable now, cont regimen begun at hospital -pt is now weighing daily and is getting a bp cuff to use at home  2. Lobar pneumonia ([place]) -seems this has resolved -f/u cxr recommended, but risk outweighs benefit considering his immunocompromise amid covid -he seems clinically improved w/o cough, wheeze, chest pain or sob, afebrile  3. Essential hypertension, benign -bp has been controlled as best we know, but he's going to get meter from [place] and check himself and record  4. Chronic kidney disease (CKD), stage III (moderate) (HCC) -monitored by [facility], Avoid nephrotoxic agents like nsaids, dose adjust renally excreted meds, hydrate  5. Bipolar 1 disorder (HCC) -stable, taking all of his meds as directed  6. Postinflammatory pulmonary fibrosis (HCC) -per pulmonary notes before, stable, monitor  7. Anemia in stage 3 chronic kidney disease ([place]) -f/u labs at time of august visit and prn  Medications were reviewed and reconciled due to recent hospitalization and ED visit.  Labs/tests ordered:  Had f/u labs already, will check next labs at time of appt Next appt:  cancel 06/21/2018--f/u in august Non face-to-face time spent on televisit: 21 minutes  Ayo Smoak L. Analeigha Nauman, D.O. [place]  [address] [place], [place] [address] Cell Phone (Mon-Fri 8am-5pm):  [phone] On Call:  [phone] & follow prompts after 5pm & weekends Office Phone:  [phone] Office Fax:  [phone]

## 2018-06-10 ENCOUNTER — Other Ambulatory Visit: Payer: Self-pay

## 2018-06-10 ENCOUNTER — Ambulatory Visit (HOSPITAL_COMMUNITY)
Admission: RE | Admit: 2018-06-10 | Discharge: 2018-06-10 | Disposition: A | Discharge status: Home / Self Care | Payer: Medicare Other | Source: Ambulatory Visit | Attending: Cardiology | Admitting: Cardiology

## 2018-06-10 DIAGNOSIS — I5022 Chronic systolic (congestive) heart failure: Secondary | ICD-10-CM

## 2018-06-10 DIAGNOSIS — N183 Chronic kidney disease, stage 3 unspecified: Secondary | ICD-10-CM

## 2018-06-10 NOTE — Patient Instructions (Addendum)
Lab work will need to be done next week with a bp check. This is scheduled for April 21st at 12:00p. The parking code is 8007.  Please follow up with the [facility] in 4 weeks. This is scheduled for May 14th at 1:30pm. This will be a virtual visit via telephone.

## 2018-06-10 NOTE — Progress Notes (Signed)
Spoke with pt about after visit summary instructions. Pt agreeable to visit next week for labs and bp check. Pt verbalized understanding. Scheduled follow up. Pt denied any further needs.

## 2018-06-10 NOTE — Progress Notes (Signed)
Heart Failure TeleHealth Note  Due to national recommendations of social distancing due to [place] 19, Audio/video telehealth visit is felt to be most appropriate for this patient at this time.  See MyChart message from today for patient consent regarding telehealth for [facility].  Date:  06/10/2018   ID:  Brent [name], DOB Feb 25, 1945, MRN [id number]  Location: Home  Provider location: [place] [facility] Type of Visit: Established patient  PCP:  Brent Curry, DO  Cardiologist:  Brent Martinique, MD Primary HF: Brent Brent [name]  Chief Complaint: HF follow up   History of Present Illness: Brent [name] is a 73 y.o. male with a history of Crohn's disease, celiac disease, well controlled HTN, COPD, prior tobacco use, CKD 3, chronic anemia, and bipolar disorder. Newly diagnosed with systolic HF (09/7577 EF 72-82%).   Admitted 3/11-3/16/20 with SOB and hypoxia. Found to have PNA and required BIPAP and antibiotics. Blood cultures were negative. Echo showed newly reduced EF 30-35%. Cardiology consulted and he had nuclear stress test, which was read as "high risk" due to low EF. Per Brent [name], did not think changes were indicative of coronary disease and cath was not pursued due this and due to CKD. HF meds optimized. Limited with CKD3. He had one short run of NSVT. Also had AKI on CKD3, but creatinine improved to baseline 1.77 on day of discharge.  He was seen in ED 3/18 with SOB after misplacing his nebulizer. BNP was elevated, so he was given 40 mg IV lasix and discharged with lasix 20 mg BID. Referred to HF clinic.   He presents via Engineer, civil (consulting) for a telehealth visit today. Overall feeling great. He is still taking lasix 40 mg daily. Great UOP on lasix. No SOB. Staying active, walking around the neighborhood daily. Wearing mask outside. No edema or PND. Sleeps on 2 pillows at baseline. No CP, dizziness, or palpitations. Limits fluid and salt, but had [employer] the other day  and couldn't finish it because it was so salty. Taking all medications. He found his scale and his weight is stable 150-153 lbs at home. [facility]. He did not get labs at Brent Brent [name] office.   he denies symptoms worrisome for COVID 19.   Past Medical History:  Diagnosis Date  . Anemia   . Anxiety   . Benign paroxysmal positional vertigo   . Bipolar I disorder, most recent episode (or current) unspecified   . Celiac disease   . Cellulitis and abscess of unspecified site   . Chronic kidney disease, stage III (moderate) (HCC)   . Crohn's    Remicade q8 weeks  . Depression   . Dizziness and giddiness   . Essential and other specified forms of tremor    medication-induced Parkinson's, now resolved  . Essential hypertension, benign   . Gout 2018  . Hypertrophy of prostate without urinary obstruction and other lower urinary tract symptoms (LUTS)   . Hypopotassemia   . Impotence of organic origin   . Insomnia with sleep apnea, unspecified   . Iron deficiency anemia, unspecified   . Loss of weight   . Malnutrition of mild degree ([place])   . 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 Neuralgia, neuritis, and radiculitis, unspecified   . Other B-complex deficiencies   . Other extrapyramidal disease and abnormal movement disorder   . Postinflammatory pulmonary fibrosis ([place])   . Tobacco use disorder   . Vertigo 2018  . Viral  warts, unspecified    Past Surgical History:  Procedure Laterality Date  . CHOLECYSTECTOMY  07-12-2010  . SMALL INTESTINE SURGERY     x 2     Current Outpatient Medications  Medication Sig Dispense Refill  . furosemide (LASIX) 40 MG tablet Take 1 tablet (40 mg total) by mouth daily. 90 tablet 2  . hydrALAZINE (APRESOLINE) 25 MG tablet Take 1 tablet (25 mg total) by mouth every 8 (eight) hours. 270 tablet 2  . isosorbide mononitrate (IMDUR) 30 MG 24 hr tablet Take 1 tablet (30 mg total) by mouth daily. 90 tablet 1  . metoprolol succinate (TOPROL-XL) 50 MG 24 hr  tablet Take 1 tablet (50 mg total) by mouth daily. Take with or immediately following a meal. 90 tablet 2  . allopurinol (ZYLOPRIM) 100 MG tablet Take 2 tablets (200 mg total) by mouth daily. 60 tablet 5  . Calcium Carbonate-Vit D-Min 600-400 MG-UNIT TABS Take 1 tablet by mouth 3 (three) times daily.     . cholecalciferol (VITAMIN D) 1000 units tablet Take 1,000 Units by mouth daily.     . divalproex (DEPAKOTE) 500 MG EC tablet Take 1,500 mg by mouth at bedtime.     Marland Kitchen epoetin alfa (PROCRIT) 08144 UNIT/ML injection Inject 10,000 Units into the skin every 30 (thirty) days.     . ferrous sulfate 325 (65 FE) MG tablet Take 325 mg by mouth 2 (two) times daily.      Marland Kitchen inFLIXimab (REMICADE) 100 MG injection Infuse Remicade IV schedule 1 48m/kg every 8 weeks Premedicate with Tylenol 500-6546mby mouth and Benadryl 25-5010my mouth prior to infusion. Last PPD was on 12/2009.  1 each 6  . Loperamide HCl (LOPERAMIDE A-D PO) Take 1 capsule by mouth as needed (loose stools).     . magnesium oxide (MAG-OX) 400 (241.3 Mg) MG tablet Take 2,400 mg by mouth 3 (three) times daily.   3  . meclizine (ANTIVERT) 25 MG tablet Take 25 mg by mouth 3 (three) times daily as needed for dizziness.   0  . Multiple Vitamin (MULTIVITAMIN) tablet Take 1 tablet by mouth daily.      . OMarland KitchenANZapine (ZYPREXA) 7.5 MG tablet Take 1 tablet (7.5 mg total) by mouth at bedtime. 30 tablet 11  . omeprazole (PRILOSEC) 20 MG capsule Take 20 mg by mouth daily before supper.      . polyethylene glycol (MIRALAX / GLYCOLAX) packet Take 17 g by mouth daily as needed for mild constipation. 14 each 0  . prazosin (MINIPRESS) 5 MG capsule Take 5 mg by mouth at bedtime.    . vitamin E 400 UNIT capsule Take 400 Units by mouth daily.       No current facility-administered medications for this encounter.     Allergies:   Azathioprine; Ciprofloxacin; Levaquin [levofloxacin in d5w]; and Plendil [felodipine]   Social History:  The patient  reports that he  quit smoking about 4 years ago. His smoking use included cigarettes. He started smoking about 62 years ago. He has a 49.00 pack-year smoking history. He has never used smokeless tobacco. He reports that he does not drink alcohol or use drugs.   Family History:  The patient's family history includes Diabetes in his mother; Emphysema in his father; Pneumonia in his maternal grandmother; Uterine cancer in his mother.   ROS:  Please see the history of present illness.   All other systems are personally reviewed and negative.   Exam:  (Video/Tele Health Call; Exam is  subjective and or/visual.) General:  Speaks in full sentences. No resp difficulty. Lungs: Normal respiratory effort with conversation.  Abdomen: Non-distended per patient report Extremities: Pt denies edema. Neuro: Alert & oriented x 3.   Recent Labs: 05/07/2018: TSH 0.202 05/10/2018: ALT 8; Magnesium 1.8 05/12/2018: B Natriuretic Peptide 1,145.7; BUN 17; Creatinine, Ser 1.77; Platelets 166; Potassium 4.1; Sodium 142 05/31/2018: Hemoglobin 11.3  Personally reviewed   Wt Readings from Last 3 Encounters:  05/12/18 74.9 kg (165 lb 1 oz)  05/11/18 76.7 kg (169 lb)  05/10/18 75.3 kg (166 lb 0.1 oz)      ASSESSMENT AND PLAN:  1.  Chronic systolic HF (new as of 10/8336). 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 ?viral. HTN well controlled. Narrow QRS. No hx of AF. Not sure if he snores. He has some family hx of CHF. - Echo 05/05/2018: EF 30-35%, mild concentric LVH, diffuse HK, RV normal, trivial AI - Stress test 05/10/18: High risk, EF 28%. Per Brent [name], did not think mild and patchy perfusion changes were indicative of coronary disease. 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 Cath not pursued due to no CP and CKD3 - NYHA II. Volume sounds stable.  - Continue lasix 40 mg daily.  - Continue hydral 25 mg TID + imdur 30 mg daily - Continue Toprol XL 50 mg daily - Would like to add spiro. He did not get labs done at PCP office. Will have him come to HF clinic for BMET and BP  check.  - Repeat echo in 3-4 months. Discussed possibility of EP referral for ICD if EF remains low. He is on recall list. - Discussed limiting fluid and salt intake and importance of daily weights. He is going to find his scale and start weighing.   2. Tobacco use - Quit smoking 3 years ago.   3. CKD3 - Baseline looks to be 1.8-2.1 - Creatinine stable 1.77, K 4.1 in ED on 3/18. - Check BMET as above.   COVID screen The patient does not have any symptoms that suggest any further testing/ screening at this time.  Social distancing reinforced today. His son is his 23. He would be in charge of making medical decisions for him in the event that pt was unable to make decisions himself. They have spoken about his wishes.   The following changes were made today:  No changes today  Recommended follow-up: BMET and BP check in HF clinic this week or early next week. Follow up in 4 weeks with APP  Today, I have spent 14 minutes with the patient with telehealth technology discussing the above issues .    Signed, Georgiana Shore, NP  06/10/2018 10:35 AM  [place] [address] [facility] and [place] [address] [phone] (office) [phone] (fax)

## 2018-06-10 NOTE — Addendum Note (Signed)
Encounter addended by: Marlise Eves, RN on: 06/10/2018 11:08 AM  Actions taken: Order list changed, Diagnosis association updated, Clinical Note Signed

## 2018-06-14 DIAGNOSIS — I129 Hypertensive chronic kidney disease with stage 1 through stage 4 chronic kidney disease, or unspecified chronic kidney disease: Secondary | ICD-10-CM | POA: Diagnosis not present

## 2018-06-14 DIAGNOSIS — N183 Chronic kidney disease, stage 3 (moderate): Secondary | ICD-10-CM | POA: Diagnosis not present

## 2018-06-14 DIAGNOSIS — D631 Anemia in chronic kidney disease: Secondary | ICD-10-CM | POA: Diagnosis not present

## 2018-06-14 DIAGNOSIS — N2581 Secondary hyperparathyroidism of renal origin: Secondary | ICD-10-CM | POA: Diagnosis not present

## 2018-06-14 DIAGNOSIS — N189 Chronic kidney disease, unspecified: Secondary | ICD-10-CM | POA: Diagnosis not present

## 2018-06-14 DIAGNOSIS — E876 Hypokalemia: Secondary | ICD-10-CM | POA: Diagnosis not present

## 2018-06-14 LAB — BASIC METABOLIC PANEL
BUN: 40 — AB (ref 4–21)
Creatinine: 3.1 — AB (ref 0.6–1.3)
Glucose: 80
Potassium: 5 (ref 3.4–5.3)
Sodium: 141 (ref 137–147)

## 2018-06-14 LAB — HEPATIC FUNCTION PANEL
ALT: 7 — AB (ref 10–40)
AST: 13 — AB (ref 14–40)
Alkaline Phosphatase: 88 (ref 25–125)
Bilirubin, Total: 0.4

## 2018-06-14 LAB — CBC AND DIFFERENTIAL
HCT: 36 — AB (ref 41–53)
Hemoglobin: 12.1 — AB (ref 13.5–17.5)
Platelets: 254 (ref 150–399)
WBC: 13.7

## 2018-06-15 ENCOUNTER — Other Ambulatory Visit: Payer: Self-pay

## 2018-06-15 ENCOUNTER — Ambulatory Visit (HOSPITAL_COMMUNITY)
Admission: RE | Admit: 2018-06-15 | Discharge: 2018-06-15 | Disposition: A | Discharge status: Home / Self Care | Payer: Medicare Other | Source: Ambulatory Visit | Attending: Internal Medicine | Admitting: Internal Medicine

## 2018-06-15 DIAGNOSIS — I5022 Chronic systolic (congestive) heart failure: Secondary | ICD-10-CM | POA: Diagnosis not present

## 2018-06-15 LAB — BASIC METABOLIC PANEL
Anion gap: 11 (ref 5–15)
BUN: 44 mg/dL — ABNORMAL HIGH (ref 8–23)
CO2: 25 mmol/L (ref 22–32)
Calcium: 9.3 mg/dL (ref 8.9–10.3)
Chloride: 102 mmol/L (ref 98–111)
Creatinine, Ser: 3.14 mg/dL — ABNORMAL HIGH (ref 0.61–1.24)
GFR calc Af Amer: 22 mL/min — ABNORMAL LOW (ref >60)
GFR calc non Af Amer: 19 mL/min — ABNORMAL LOW (ref >60)
Glucose, Bld: 108 mg/dL — ABNORMAL HIGH (ref 70–99)
Potassium: 4.9 mmol/L (ref 3.5–5.1)
Sodium: 138 mmol/L (ref 135–145)

## 2018-06-16 ENCOUNTER — Encounter: Payer: Self-pay | Admitting: *Deleted

## 2018-06-17 ENCOUNTER — Telehealth (HOSPITAL_COMMUNITY): Payer: Self-pay

## 2018-06-17 DIAGNOSIS — I5022 Chronic systolic (congestive) heart failure: Secondary | ICD-10-CM

## 2018-06-17 DIAGNOSIS — N3281 Overactive bladder: Secondary | ICD-10-CM | POA: Diagnosis not present

## 2018-06-17 MED ORDER — FUROSEMIDE 20 MG PO TABS: 20.0000 mg | ORAL_TABLET | Freq: Every day | ORAL | 2 refills | Status: DC

## 2018-06-17 NOTE — Telephone Encounter (Signed)
Called both pt lines. Spoke to pt on home phone. Reviewed lab work results and new medication changes. Verified that pt is NOT taking potassium supplements at this time. Pt verbalized understanding. Pt agreeable to upcoming lab appointment.

## 2018-06-21 ENCOUNTER — Encounter: Payer: Self-pay | Admitting: Internal Medicine

## 2018-06-21 DIAGNOSIS — N189 Chronic kidney disease, unspecified: Secondary | ICD-10-CM | POA: Diagnosis not present

## 2018-06-21 DIAGNOSIS — I129 Hypertensive chronic kidney disease with stage 1 through stage 4 chronic kidney disease, or unspecified chronic kidney disease: Secondary | ICD-10-CM | POA: Diagnosis not present

## 2018-06-22 ENCOUNTER — Other Ambulatory Visit: Payer: Self-pay

## 2018-06-22 ENCOUNTER — Telehealth (HOSPITAL_COMMUNITY): Payer: Self-pay

## 2018-06-22 ENCOUNTER — Ambulatory Visit (HOSPITAL_COMMUNITY)
Admission: RE | Admit: 2018-06-22 | Discharge: 2018-06-22 | Disposition: A | Discharge status: Home / Self Care | Payer: Medicare Other | Source: Ambulatory Visit | Attending: Internal Medicine | Admitting: Internal Medicine

## 2018-06-22 DIAGNOSIS — I5022 Chronic systolic (congestive) heart failure: Secondary | ICD-10-CM | POA: Insufficient documentation

## 2018-06-22 LAB — BASIC METABOLIC PANEL
Anion gap: 8 (ref 5–15)
BUN: 38 mg/dL — ABNORMAL HIGH (ref 8–23)
CO2: 23 mmol/L (ref 22–32)
Calcium: 9.1 mg/dL (ref 8.9–10.3)
Chloride: 109 mmol/L (ref 98–111)
Creatinine, Ser: 2.23 mg/dL — ABNORMAL HIGH (ref 0.61–1.24)
GFR calc Af Amer: 33 mL/min — ABNORMAL LOW (ref >60)
GFR calc non Af Amer: 28 mL/min — ABNORMAL LOW (ref >60)
Glucose, Bld: 115 mg/dL — ABNORMAL HIGH (ref 70–99)
Potassium: 4.3 mmol/L (ref 3.5–5.1)
Sodium: 140 mmol/L (ref 135–145)

## 2018-06-22 NOTE — Telephone Encounter (Signed)
Spoke to pt to review lab work. Pt verbalized understanding and was grateful for the call.

## 2018-06-28 DIAGNOSIS — Z961 Presence of intraocular lens: Secondary | ICD-10-CM | POA: Diagnosis not present

## 2018-06-28 DIAGNOSIS — N183 Chronic kidney disease, stage 3 (moderate): Secondary | ICD-10-CM | POA: Diagnosis not present

## 2018-06-30 DIAGNOSIS — I129 Hypertensive chronic kidney disease with stage 1 through stage 4 chronic kidney disease, or unspecified chronic kidney disease: Secondary | ICD-10-CM | POA: Diagnosis not present

## 2018-06-30 DIAGNOSIS — N183 Chronic kidney disease, stage 3 (moderate): Secondary | ICD-10-CM | POA: Diagnosis not present

## 2018-06-30 DIAGNOSIS — D631 Anemia in chronic kidney disease: Secondary | ICD-10-CM | POA: Diagnosis not present

## 2018-07-01 ENCOUNTER — Encounter (HOSPITAL_COMMUNITY): Payer: Medicare Other

## 2018-07-01 ENCOUNTER — Other Ambulatory Visit: Payer: Self-pay

## 2018-07-01 ENCOUNTER — Ambulatory Visit (HOSPITAL_COMMUNITY)
Admission: RE | Admit: 2018-07-01 | Discharge: 2018-07-01 | Disposition: A | Discharge status: Home / Self Care | Payer: Medicare Other | Source: Ambulatory Visit | Attending: Nephrology | Admitting: Nephrology

## 2018-07-01 DIAGNOSIS — N183 Chronic kidney disease, stage 3 (moderate): Secondary | ICD-10-CM | POA: Diagnosis not present

## 2018-07-01 DIAGNOSIS — D631 Anemia in chronic kidney disease: Secondary | ICD-10-CM | POA: Diagnosis not present

## 2018-07-01 LAB — IRON AND TIBC
Iron: 72 ug/dL (ref 45–182)
Saturation Ratios: 31 % (ref 17.9–39.5)
TIBC: 235 ug/dL — ABNORMAL LOW (ref 250–450)
UIBC: 163 ug/dL

## 2018-07-01 LAB — FERRITIN: Ferritin: 592 ng/mL — ABNORMAL HIGH (ref 24–336)

## 2018-07-01 LAB — HEMOGLOBIN: Hemoglobin: 10.8 g/dL — ABNORMAL LOW (ref 13.0–17.0)

## 2018-07-01 MED ORDER — EPOETIN ALFA 10000 UNIT/ML IJ SOLN
10000.0000 [IU] | INTRAMUSCULAR | Status: DC
  Administered 2018-07-01: 10000 [IU] via SUBCUTANEOUS
  Filled 2018-07-01: qty 1

## 2018-07-01 NOTE — Progress Notes (Addendum)
PATIENT CARE CENTER NOTE  Diagnosis: Anemia associated with Chronic Renal Failure, anemia associated with renal disease  Provider: Elmarie Shiley MD   Procedure: Epoetin Alfa (Epogen) injection   Note: Patient received sub-q Epogen injection in right arm. Tolerated well. Labs drawn pre-injection and Hemoglobin was 10.8. Patient BP was 89/73.  Per patient, his BP has been running low recently and he will update his provider on current reading. Discharge instructions given. Patient alert, oriented and ambulatory at discharge.

## 2018-07-01 NOTE — Patient Outreach (Signed)
[facility]) Care Management  07/01/2018  Brent [name] May 16, 1945 [id number]  TELEPHONE SCREENING Referral date: 06/30/18 Referral source: primary MD referral Referral reason: Medication management, appointment assistance. Doesn't have a strong social support Insurance: [employer]  ATTEMPT #1  Telephone call to patient regarding primary MD referral. Unable to reach patient. HIPAA compliant voice message left with call back phone number.   PLAN: RNCM will attempt 2nd telephone call to patient within 5 business days.  RNCM will send patient outreach letter to attempt contact.  Quinn Plowman RN,BSN,CCM [facility] Telephonic  [phone]

## 2018-07-01 NOTE — Discharge Instructions (Signed)
Epoetin Alfa injection What is this medicine? EPOETIN ALFA (e POE e tin AL fa) helps your body make more red blood cells. This medicine is used to treat anemia caused by chronic kidney disease, cancer chemotherapy, or HIV-therapy. It may also be used before surgery if you have anemia. This medicine may be used for other purposes; ask your health care provider or pharmacist if you have questions. COMMON BRAND NAME(S): Epogen, Procrit, Retacrit What should I tell my health care provider before I take this medicine? They need to know if you have any of these conditions: -cancer -heart disease -high blood pressure -history of blood clots -history of stroke -low levels of folate, iron, or vitamin B12 in the blood -seizures -an unusual or allergic reaction to erythropoietin, albumin, benzyl alcohol, hamster proteins, other medicines, foods, dyes, or preservatives -pregnant or trying to get pregnant -breast-feeding How should I use this medicine? This medicine is for injection into a vein or under the skin. It is usually given by a health care professional in a hospital or clinic setting. If you get this medicine at home, you will be taught how to prepare and give this medicine. Use exactly as directed. Take your medicine at regular intervals. Do not take your medicine more often than directed. It is important that you put your used needles and syringes in a special sharps container. Do not put them in a trash can. If you do not have a sharps container, call your pharmacist or healthcare provider to get one. A special MedGuide will be given to you by the pharmacist with each prescription and refill. Be sure to read this information carefully each time. Talk to your pediatrician regarding the use of this medicine in children. While this drug may be prescribed for selected conditions, precautions do apply. Overdosage: If you think you have taken too much of this medicine contact a poison control center  or emergency room at once. NOTE: This medicine is only for you. Do not share this medicine with others. What if I miss a dose? If you miss a dose, take it as soon as you can. If it is almost time for your next dose, take only that dose. Do not take double or extra doses. What may interact with this medicine? Interactions have not been studied. This list may not describe all possible interactions. Give your health care provider a list of all the medicines, herbs, non-prescription drugs, or dietary supplements you use. Also tell them if you smoke, drink alcohol, or use illegal drugs. Some items may interact with your medicine. What should I watch for while using this medicine? Your condition will be monitored carefully while you are receiving this medicine. You may need blood work done while you are taking this medicine. This medicine may cause a decrease in vitamin B6. You should make sure that you get enough vitamin B6 while you are taking this medicine. Discuss the foods you eat and the vitamins you take with your health care professional. What side effects may I notice from receiving this medicine? Side effects that you should report to your doctor or health care professional as soon as possible: -allergic reactions like skin rash, itching or hives, swelling of the face, lips, or tongue -seizures -signs and symptoms of a blood clot such as breathing problems; changes in vision; chest pain; severe, sudden headache; pain, swelling, warmth in the leg; trouble speaking; sudden numbness or weakness of the face, arm or leg -signs and symptoms of a stroke  like changes in vision; confusion; trouble speaking or understanding; severe headaches; sudden numbness or weakness of the face, arm or leg; trouble walking; dizziness; loss of balance or coordination Side effects that usually do not require medical attention (report to your doctor or health care professional if they continue or are  bothersome): -chills -cough -dizziness -fever -headaches -joint pain -muscle cramps -muscle pain -nausea, vomiting -pain, redness, or irritation at site where injected This list may not describe all possible side effects. Call your doctor for medical advice about side effects. You may report side effects to FDA at 1-800-FDA-1088. Where should I keep my medicine? Keep out of the reach of children. Store in a refrigerator between 2 and 8 degrees C (36 and 46 degrees F). Do not freeze or shake. Throw away any unused portion if using a single-dose vial. Multi-dose vials can be kept in the refrigerator for up to 21 days after the initial dose. Throw away unused medicine. NOTE: This sheet is a summary. It may not cover all possible information. If you have questions about this medicine, talk to your doctor, pharmacist, or health care provider.  2019 Elsevier/Gold Standard (2016-09-19 08:35:19)

## 2018-07-02 DIAGNOSIS — L84 Corns and callosities: Secondary | ICD-10-CM | POA: Diagnosis not present

## 2018-07-02 DIAGNOSIS — I739 Peripheral vascular disease, unspecified: Secondary | ICD-10-CM | POA: Diagnosis not present

## 2018-07-02 DIAGNOSIS — M7742 Metatarsalgia, left foot: Secondary | ICD-10-CM | POA: Diagnosis not present

## 2018-07-02 DIAGNOSIS — M2042 Other hammer toe(s) (acquired), left foot: Secondary | ICD-10-CM | POA: Diagnosis not present

## 2018-07-02 DIAGNOSIS — B351 Tinea unguium: Secondary | ICD-10-CM | POA: Diagnosis not present

## 2018-07-06 ENCOUNTER — Other Ambulatory Visit: Payer: Self-pay

## 2018-07-06 NOTE — Patient Outreach (Addendum)
[facility]) Care Management  07/06/2018  Brent [name] [birth year] [id number]  TELEPHONE SCREENING Referral date: 06/30/18 Referral source: primary MD referral Referral reason: Medication management, appointment assistance. Doesn't have a strong social support Insurance: [employer]   Telephone call to patient regarding primary MD referral. HIPAA verified with patient. RNCM introduced herself and explained reason for call.  Patient states he is not having issues with his medications as this time. 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  Patient states Dr. Posey Pronto at [facility] changed his metoprolol medication.  Patient states he is checking his blood pressure and weight daily and recording.  Patient reports his most recent weight is 151.2 lbs. Patient states he has lost quite a bit of weight.  He states he does not have the best appetite. Patient states his doctor is aware of this and advised him to drink ensure 1 time per day.  Patient states his blood pressure is running low.  He reports yesterdays blood pressure was 88/64. Patient states his doctor is aware of his blood pressure readings.  Patient states he has chronic kidney disease stage 3.  Patient states I am on a fluid restriction of 1,500 per day but states I also have to drink enough fluid to manage the states 3 Kidney disease.  Patient states he goes to the heart failure clinic. He reports his next virtual appointment is scheduled for 07/08/18.  Patient reports he was in the hospital in March 2020 with pneumonia and heart failure. He states Heart failure is a new diagnosis for him.  Patient states when he got sick it really scared him because he lives by himself. Patient states he takes good notes regarding his medical information so that he can keep up with it. Patient reports having transportation to get to his appointments.  He states his sister, Assunta Curtis helps him  regarding his medical needs.   RNCM discussed and offered [facility] care management services. Patient verbally agreed to follow up with health coach.   PLAN; RNCM will refer patient to health coach.   Quinn Plowman RN,BSN,CCM [facility] Telephonic  [phone]

## 2018-07-07 DIAGNOSIS — I129 Hypertensive chronic kidney disease with stage 1 through stage 4 chronic kidney disease, or unspecified chronic kidney disease: Secondary | ICD-10-CM | POA: Diagnosis not present

## 2018-07-08 ENCOUNTER — Other Ambulatory Visit: Payer: Self-pay

## 2018-07-08 ENCOUNTER — Ambulatory Visit (HOSPITAL_COMMUNITY)
Admission: RE | Admit: 2018-07-08 | Discharge: 2018-07-08 | Disposition: A | Discharge status: Home / Self Care | Payer: Medicare Other | Source: Ambulatory Visit | Attending: Adult Health | Admitting: Adult Health

## 2018-07-08 DIAGNOSIS — I5022 Chronic systolic (congestive) heart failure: Secondary | ICD-10-CM | POA: Diagnosis not present

## 2018-07-08 DIAGNOSIS — N183 Chronic kidney disease, stage 3 unspecified: Secondary | ICD-10-CM

## 2018-07-08 DIAGNOSIS — I951 Orthostatic hypotension: Secondary | ICD-10-CM

## 2018-07-08 MED ORDER — FUROSEMIDE 20 MG PO TABS: 20.0000 mg | ORAL_TABLET | ORAL | 3 refills | Status: DC

## 2018-07-08 NOTE — Patient Instructions (Addendum)
Decrease lasix to 20 mg Three times a week.  Monday, Wednesday and Friday  Your physician recommends that you schedule a follow-up appointment in: 4 weeks with Nurse Practitioner  Your physician recommends that you schedule a follow-up appointment in: 2 months with Dr [name] and an Echo  Your physician has requested that you have an echocardiogram. Echocardiography is a painless test that uses sound waves to create images of your heart. It provides your doctor with information about the size and shape of your heart and how well your heart's chambers and valves are working. This procedure takes approximately one hour. There are no restrictions for this procedure.

## 2018-07-08 NOTE — Progress Notes (Signed)
AVS discussed. Pt aware of medication change.  And plan for upcoming appts.  Pt verbalized understanding. AVS mailed.

## 2018-07-08 NOTE — Progress Notes (Signed)
Heart Failure TeleHealth Note  Due to national recommendations of social distancing due to [place] 19, telehealth visit is felt to be most appropriate for this patient at this time.  I discussed the limitations, risks, security and privacy concerns of performing an evaluation and management service by telephone and the availability of in person appointments. I also discussed with the patient that there may be a patient responsible charge related to this service. The patient expressed understanding and agreed to proceed.   ID:  Brent [name], DOB 1945-11-19, MRN [id number]  Location: Home  Provider location: [address], [place] Type of Visit: Established patient   PCP:  Gayland Curry, DO  Cardiologist:  Peter Martinique, MD Primary HF: Dr [name]  Chief Complaint: Heart failure   History of Present Illness: Brent [name] is a 73 y.o. male with a history of Crohn's disease, celiac disease,well controlledHTN, COPD,priortobacco use, CKD 3, chronic anemia, and bipolar disorder.Newly diagnosed with systolic HF (08/6158 EF 73-71%).  Admitted 3/11-3/16/20 with SOB and hypoxia. Found to have PNA and required BIPAP and antibiotics. Blood cultures were negative. Echo showed newly reduced EF 30-35%. Cardiology consulted and he had nuclear stress test, which was read as "high risk" due to low EF. Per Dr Sallyanne Kuster, did not think changes were indicative of coronary disease and cath was not pursued due this and due to CKD. HF meds optimized. Limited with CKD3. He had one short run of NSVT. Also had AKI on CKD3, but creatinine improved to baseline 1.77 on day of discharge.  He was seen in ED 3/18 with SOB after misplacing his nebulizer. BNP was elevated, so he was given 40 mg IV lasix and discharged with lasix 20 mg BID. Referred to HF clinic.  Patient presents via audio/video conferencing for a telehealth visit today for 4 week follow up. Last visit he was doing well, but labs showed  creatinine up to 3.14. He was told to hold lasix for 3 days, then decrease to 20 mg daily. Repeat labs the following week showed improved creatinine to 2.23. Overall doing okay. He saw Dr Posey Pronto yesterday and he stopped his hydralazine and decreased his metoprolol due to low BPs. He reports positive orthostatics yesterday in [place] office. SBP 90s on home checks today. He gets dizzy after moving around, but not with standing. No SOB. Staying active around the house. No edema, orthopnea, or PND. Weight trending down at home 153 lbs -> 148 lbs. Limiting fluid and salt intake. Taking all medications.   Pt denies symptoms of cough, fevers, chills, or new SOB worrisome for COVID 19.    Past Medical History:  Diagnosis Date  . Anemia   . Anxiety   . Benign paroxysmal positional vertigo   . Bipolar I disorder, most recent episode (or current) unspecified   . Celiac disease   . Cellulitis and abscess of unspecified site   . Chronic kidney disease, stage III (moderate) (HCC)   . Crohn's    Remicade q8 weeks  . Depression   . Dizziness and giddiness   . Essential and other specified forms of tremor    medication-induced Parkinson's, now resolved  . Essential hypertension, benign   . Gout 2018  . Hypertrophy of prostate without urinary obstruction and other lower urinary tract symptoms (LUTS)   . Hypopotassemia   . Impotence of organic origin   . Insomnia with sleep apnea, unspecified   . Iron deficiency anemia, unspecified   .  Loss of weight   . Malnutrition of mild degree ([place])   . 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 Neuralgia, neuritis, and radiculitis, unspecified   . Other B-complex deficiencies   . Other extrapyramidal disease and abnormal movement disorder   . Postinflammatory pulmonary fibrosis ([place])   . Tobacco use disorder   . Vertigo 2018  . Viral warts, unspecified    Past Surgical History:  Procedure Laterality Date  . CHOLECYSTECTOMY  07-12-2010  . SMALL INTESTINE SURGERY     x 2      Current Outpatient Medications  Medication Sig Dispense Refill  . furosemide (LASIX) 20 MG tablet Take 1 tablet (20 mg total) by mouth daily. 90 tablet 2  . isosorbide mononitrate (IMDUR) 30 MG 24 hr tablet Take 1 tablet (30 mg total) by mouth daily. 90 tablet 1  . metoprolol succinate (TOPROL-XL) 50 MG 24 hr tablet Take 1 tablet (50 mg total) by mouth daily. Take with or immediately following a meal. (Patient taking differently: Take 25 mg by mouth daily. Take with or immediately following a meal. ) 90 tablet 2  . allopurinol (ZYLOPRIM) 100 MG tablet Take 2 tablets (200 mg total) by mouth daily. 60 tablet 5  . Calcium Carbonate-Vit D-Min 600-400 MG-UNIT TABS Take 1 tablet by mouth 3 (three) times daily.     . cholecalciferol (VITAMIN D) 1000 units tablet Take 1,000 Units by mouth daily.     . divalproex (DEPAKOTE) 500 MG EC tablet Take 1,500 mg by mouth at bedtime.     Marland Kitchen epoetin alfa (PROCRIT) 99833 UNIT/ML injection Inject 10,000 Units into the skin every 30 (thirty) days.     . ferrous sulfate 325 (65 FE) MG tablet Take 325 mg by mouth 3 (three) times a week.     . inFLIXimab (REMICADE) 100 MG injection Infuse Remicade IV schedule 1 86m/kg every 8 weeks Premedicate with Tylenol 500-653mby mouth and Benadryl 25-5056my mouth prior to infusion. Last PPD was on 12/2009.  1 each 6  . Loperamide HCl (LOPERAMIDE A-D PO) Take 1 capsule by mouth as needed (loose stools).     . magnesium oxide (MAG-OX) 400 (241.3 Mg) MG tablet Take 2,400 mg by mouth 3 (three) times daily.   3  . meclizine (ANTIVERT) 25 MG tablet Take 25 mg by mouth 3 (three) times daily as needed for dizziness.   0  . Multiple Vitamin (MULTIVITAMIN) tablet Take 1 tablet by mouth daily.      . OMarland KitchenANZapine (ZYPREXA) 7.5 MG tablet Take 1 tablet (7.5 mg total) by mouth at bedtime. 30 tablet 11  . omeprazole (PRILOSEC) 20 MG capsule Take 20 mg by mouth daily before supper.      . polyethylene glycol (MIRALAX / GLYCOLAX) packet Take 17 g  by mouth daily as needed for mild constipation. 14 each 0  . prazosin (MINIPRESS) 5 MG capsule Take 5 mg by mouth at bedtime.    . vitamin E 400 UNIT capsule Take 400 Units by mouth daily.       No current facility-administered medications for this encounter.     Allergies:   Azathioprine; Ciprofloxacin; Levaquin [levofloxacin in d5w]; and Plendil [felodipine]   Social History:  The patient  reports that he quit smoking about 4 years ago. His smoking use included cigarettes. He started smoking about 62 years ago. He has a 49.00 pack-year smoking history. He has never used smokeless tobacco. He reports that he does not drink alcohol or use drugs.   Family  History:  The patient's family history includes Diabetes in his mother; Emphysema in his father; Pneumonia in his maternal grandmother; Uterine cancer in his mother.   ROS:  Please see the history of present illness.   All other systems are personally reviewed and negative.    Exam:  (Video/Tele Health Call; Exam is subjective and or/visual.) General:  Speaks in full sentences. No resp difficulty. Lungs: Normal respiratory effort with conversation.  Abdomen: No distension per patient report Extremities: Pt denies edema. Neuro: Alert & oriented x 3.   Recent Labs: 05/07/2018: TSH 0.202 05/10/2018: Magnesium 1.8 05/12/2018: B Natriuretic Peptide 1,145.7 06/14/2018: ALT 7; Platelets 254 06/22/2018: BUN 38; Creatinine, Ser 2.23; Potassium 4.3; Sodium 140 07/01/2018: Hemoglobin 10.8  Personally reviewed   Wt Readings from Last 3 Encounters:  05/12/18 74.9 kg (165 lb 1 oz)  05/11/18 76.7 kg (169 lb)  05/10/18 75.3 kg (166 lb 0.1 oz)      ASSESSMENT AND PLAN:  1.Chronic systolic HF (new as of 02/4968). 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 ?viral. HTN well controlled. Narrow QRS. No hx of AF. Not sure if he snores. He has some family hx of CHF. - Echo 05/05/2018: EF 30-35%, mild concentric LVH, diffuse HK, RV normal, trivial AI - Stress test 05/10/18: High  risk, EF 28%. Per Dr Sallyanne Kuster, did not think mild and patchy perfusion changes were indicative of coronary disease. 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 Cath not pursued due to no CP and CKD3 - NYHA II. Volume sounds dry with orthostasis and low BP. - Decrease lasix to 20 mg MWF. Okay to take extra 20 mg PRN.  - Off hydral with hypotension. Continue imdur 30 mg daily - Continue Toprol XL 25 mg daily - Hold off on spiro with recent AKI and orthostasis - Repeat echo in July. Discussed possibility of EP referral for ICD if EF remains low.  - Discussed limiting fluid and salt intake and importance of daily weights. He is going to find his scale and start weighing.   2. Tobacco use - Quit smoking 3 years ago. No change.   3. CKD3 - Baseline looks to be 1.8-2.1 - Creatinine 2.23 on 4/28 - Getting Epogen. Now following with Dr Posey Pronto.    COVID screen The patient does not have any symptoms that suggest any further testing/ screening at this time.  Social distancing reinforced today.  Patient Risk: After full review of this patients clinical status, I feel that they are at moderate risk for cardiac decompensation at this time.  Orders/Follow up: Decrease lasix to 20 mg MWF. Follow up in 4 weeks with APP. Schedule echo 2 months with Dr [name]  Today, I have spent 12 minutes with the patient with telehealth technology discussing the above issues.    Signed, Georgiana Shore, NP  07/08/2018 1:27 PM   [facility] [address] Heart and [place] [address] [phone] (office) [phone] (fax)

## 2018-07-08 NOTE — Addendum Note (Signed)
Encounter addended by: Valeda Malm, RN on: 07/08/2018 2:05 PM  Actions taken: Order list changed, Diagnosis association updated, Clinical Note Signed

## 2018-07-09 DIAGNOSIS — H903 Sensorineural hearing loss, bilateral: Secondary | ICD-10-CM | POA: Diagnosis not present

## 2018-07-09 DIAGNOSIS — H6123 Impacted cerumen, bilateral: Secondary | ICD-10-CM | POA: Diagnosis not present

## 2018-08-01 DIAGNOSIS — K50918 Crohn's disease, unspecified, with other complication: Secondary | ICD-10-CM | POA: Diagnosis not present

## 2018-08-02 ENCOUNTER — Encounter (HOSPITAL_COMMUNITY): Payer: Medicare Other

## 2018-08-06 ENCOUNTER — Ambulatory Visit (HOSPITAL_COMMUNITY)
Admission: RE | Admit: 2018-08-06 | Discharge: 2018-08-06 | Disposition: A | Discharge status: Home / Self Care | Payer: Medicare Other | Source: Ambulatory Visit | Attending: Nephrology | Admitting: Nephrology

## 2018-08-06 ENCOUNTER — Ambulatory Visit (HOSPITAL_COMMUNITY)
Admission: RE | Admit: 2018-08-06 | Discharge: 2018-08-06 | Disposition: A | Discharge status: Home / Self Care | Payer: Medicare Other | Source: Ambulatory Visit | Attending: Internal Medicine | Admitting: Internal Medicine

## 2018-08-06 ENCOUNTER — Other Ambulatory Visit: Payer: Self-pay

## 2018-08-06 DIAGNOSIS — N183 Chronic kidney disease, stage 3 unspecified: Secondary | ICD-10-CM

## 2018-08-06 DIAGNOSIS — I5022 Chronic systolic (congestive) heart failure: Secondary | ICD-10-CM

## 2018-08-06 DIAGNOSIS — D631 Anemia in chronic kidney disease: Secondary | ICD-10-CM | POA: Insufficient documentation

## 2018-08-06 LAB — IRON AND TIBC
Iron: 39 ug/dL — ABNORMAL LOW (ref 45–182)
Saturation Ratios: 20 % (ref 17.9–39.5)
TIBC: 191 ug/dL — ABNORMAL LOW (ref 250–450)
UIBC: 152 ug/dL

## 2018-08-06 LAB — HEMOGLOBIN AND HEMATOCRIT, BLOOD
HCT: 33.8 % — ABNORMAL LOW (ref 39.0–52.0)
Hemoglobin: 10.5 g/dL — ABNORMAL LOW (ref 13.0–17.0)

## 2018-08-06 LAB — FERRITIN: Ferritin: 560 ng/mL — ABNORMAL HIGH (ref 24–336)

## 2018-08-06 MED ORDER — EPOETIN ALFA 10000 UNIT/ML IJ SOLN
10000.0000 [IU] | INTRAMUSCULAR | Status: DC
  Administered 2018-08-06: 10000 [IU] via SUBCUTANEOUS
  Filled 2018-08-06: qty 1

## 2018-08-06 MED ORDER — NON FORMULARY: 10000.0000 [IU] | Status: DC

## 2018-08-06 NOTE — Progress Notes (Signed)
Heart Failure TeleHealth Note  Due to national recommendations of social distancing due to [place] 19, telehealth visit is felt to be most appropriate for this patient at this time.  I discussed the limitations, risks, security and privacy concerns of performing an evaluation and management service by telephone and the availability of in person appointments. I also discussed with the patient that there may be a patient responsible charge related to this service. The patient expressed understanding and agreed to proceed.   ID:  Brent [name], DOB Nov 25, 1945, MRN [id number]  Location: Home  Provider location: [address], [place] Type of Visit: Established patient   PCP:  Gayland Curry, DO  Cardiologist:  Peter Martinique, MD Primary HF: Dr [name]  Chief Complaint: Heart failure   History of Present Illness: Brent [name] is a 73 y.o. male with a history of Crohn's disease, celiac disease,well controlledHTN, COPD,priortobacco use, CKD 3, chronic anemia, and bipolar disorder.Newly diagnosed with systolic HF (0/7371 EF 06-26%).  Admitted 3/11-3/16/20 with SOB and hypoxia. Found to have PNA and required BIPAP and antibiotics. Blood cultures were negative. Echo showed newly reduced EF 30-35%. Cardiology consulted and he had nuclear stress test, which was read as "high risk" due to low EF. Per Dr Sallyanne Kuster, did not think changes were indicative of coronary disease and cath was not pursued due this and due to CKD. HF meds optimized. Limited with CKD3. He had one short run of NSVT. Also had AKI on CKD3, but creatinine improved to baseline 1.77 on day of discharge.  He was seen in ED 3/18 with SOB after misplacing his nebulizer. BNP was elevated, so he was given 40 mg IV lasix and discharged with lasix 20 mg BID. Referred to HF clinic.  Today he presents for audio conferencing for telehealth visit. Last night Overall feeling fine. Denies SOB/PND/Orthopnea. Appetite ok. No fever or  chills. Able to walk around the block without stopping.  Taking all medications.   Brent [name] denies symptoms of cough, fevers, chills, or new SOB worrisome for COVID 19.    Past Medical History:  Diagnosis Date  . Anemia   . Anxiety   . Benign paroxysmal positional vertigo   . Bipolar I disorder, most recent episode (or current) unspecified   . Celiac disease   . Cellulitis and abscess of unspecified site   . Chronic kidney disease, stage III (moderate) (HCC)   . Crohn's    Remicade q8 weeks  . Depression   . Dizziness and giddiness   . Essential and other specified forms of tremor    medication-induced Parkinson's, now resolved  . Essential hypertension, benign   . Gout 2018  . Hypertrophy of prostate without urinary obstruction and other lower urinary tract symptoms (LUTS)   . Hypopotassemia   . Impotence of organic origin   . Insomnia with sleep apnea, unspecified   . Iron deficiency anemia, unspecified   . Loss of weight   . Malnutrition of mild degree ([place])   . 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 Neuralgia, neuritis, and radiculitis, unspecified   . Other B-complex deficiencies   . Other extrapyramidal disease and abnormal movement disorder   . Postinflammatory pulmonary fibrosis ([place])   . Tobacco use disorder   . Vertigo 2018  . Viral warts, unspecified    Past Surgical History:  Procedure Laterality Date  . CHOLECYSTECTOMY  07-12-2010  . SMALL INTESTINE SURGERY     x 2     Current Outpatient  Medications  Medication Sig Dispense Refill  . allopurinol (ZYLOPRIM) 100 MG tablet Take 2 tablets (200 mg total) by mouth daily. 60 tablet 5  . Calcium Carbonate-Vit D-Min 600-400 MG-UNIT TABS Take 1 tablet by mouth 3 (three) times daily.     . cholecalciferol (VITAMIN D) 1000 units tablet Take 1,000 Units by mouth daily.     . divalproex (DEPAKOTE) 500 MG EC tablet Take 1,500 mg by mouth at bedtime.     Marland Kitchen epoetin alfa (PROCRIT) 03500 UNIT/ML injection Inject 10,000 Units into the skin  every 30 (thirty) days.     . ferrous sulfate 325 (65 FE) MG tablet Take 325 mg by mouth 3 (three) times a week.     . furosemide (LASIX) 20 MG tablet Take 1 tablet (20 mg total) by mouth 3 (three) times a week. Monday Wednesday and Friday 36 tablet 3  . inFLIXimab (REMICADE) 100 MG injection Infuse Remicade IV schedule 1 43m/kg every 8 weeks Premedicate with Tylenol 500-6543mby mouth and Benadryl 25-5081my mouth prior to infusion. Last PPD was on 12/2009.  1 each 6  . isosorbide mononitrate (IMDUR) 30 MG 24 hr tablet Take 1 tablet (30 mg total) by mouth daily. 90 tablet 1  . Loperamide HCl (LOPERAMIDE A-D PO) Take 1 capsule by mouth as needed (loose stools).     . magnesium oxide (MAG-OX) 400 (241.3 Mg) MG tablet Take 2,400 mg by mouth 3 (three) times daily.   3  . meclizine (ANTIVERT) 25 MG tablet Take 25 mg by mouth 3 (three) times daily as needed for dizziness.   0  . metoprolol succinate (TOPROL-XL) 50 MG 24 hr tablet Take 1 tablet (50 mg total) by mouth daily. Take with or immediately following a meal. (Patient taking differently: Take 25 mg by mouth daily. Take with or immediately following a meal. ) 90 tablet 2  . Multiple Vitamin (MULTIVITAMIN) tablet Take 1 tablet by mouth daily.      . OMarland KitchenANZapine (ZYPREXA) 7.5 MG tablet Take 1 tablet (7.5 mg total) by mouth at bedtime. 30 tablet 11  . omeprazole (PRILOSEC) 20 MG capsule Take 20 mg by mouth daily before supper.      . polyethylene glycol (MIRALAX / GLYCOLAX) packet Take 17 g by mouth daily as needed for mild constipation. 14 each 0  . prazosin (MINIPRESS) 5 MG capsule Take 5 mg by mouth at bedtime.    . vitamin E 400 UNIT capsule Take 400 Units by mouth daily.       No current facility-administered medications for this encounter.    Facility-Administered Medications Ordered in Other Encounters  Medication Dose Route Frequency Provider Last Rate Last Dose  . epoetin alfa (EPOGEN) injection 10,000 Units  10,000 Units Subcutaneous Q30  days PatElmarie ShileyD        Allergies:   Azathioprine, Ciprofloxacin, Levaquin [levofloxacin in d5w], and Plendil [felodipine]   Social History:  The patient  reports that he quit smoking about 4 years ago. His smoking use included cigarettes. He started smoking about 62 years ago. He has a 49.00 pack-year smoking history. He has never used smokeless tobacco. He reports that he does not drink alcohol or use drugs.   Family History:  The patient's family history includes Diabetes in his mother; Emphysema in his father; Pneumonia in his maternal grandmother; Uterine cancer in his mother.   ROS:  Please see the history of present illness.   All other systems are personally reviewed and negative.  Exam:  Tele Health Call; Exam is subjective General:  Speaks in full sentences. No resp difficulty. Lungs: Normal respiratory effort with conversation.  Abdomen: No distension per patient report Extremities: Brent [name] denies edema. Reports R and L foot edema.  Neuro: Alert & oriented x 3.   Recent Labs: 05/07/2018: TSH 0.202 05/10/2018: Magnesium 1.8 05/12/2018: B Natriuretic Peptide 1,145.7 06/14/2018: ALT 7; Platelets 254 06/22/2018: BUN 38; Creatinine, Ser 2.23; Potassium 4.3; Sodium 140 08/06/2018: Hemoglobin 10.5  Personally reviewed   Wt Readings from Last 3 Encounters:  05/12/18 74.9 kg (165 lb 1 oz)  05/11/18 76.7 kg (169 lb)  05/10/18 75.3 kg (166 lb 0.1 oz)      ASSESSMENT AND PLAN:  1.Chronic systolic HF (new as of 07/7670). 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 ?viral. HTN well controlled. Narrow QRS. No hx of AF. Not sure if he snores. He has some family hx of CHF. - Echo 05/05/2018: EF 30-35%, mild concentric LVH, diffuse HK, RV normal, trivial AI - Stress test 05/10/18: High risk, EF 28%. Per Dr Sallyanne Kuster, did not think mild and patchy perfusion changes were indicative of coronary disease. 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 Cath not pursued due to no CP and CKD3 NYHA II.  Volume status stable. Continue lasix to 20 mg  MWF. Okay to take extra 20 mg PRN.  - Off hydral with hypotension. Continue imdur 30 mg daily - Continue Toprol XL 25 mg daily - Hold off on spiro with recent AKI and orthostasis - Repeat ECHO on August.   2. Tobacco use - Quit smoking 3 years ago. No change.   3. CKD3 - Baseline looks to be 1.8-2.1 - Creatinine 2.23 on 4/28 - Getting Epogen. Now following with Dr Posey Pronto.    COVID screen The patient does not have any symptoms that suggest any further testing/ screening at this time.  Social distancing reinforced today.  Patient Risk: After full review of this patients clinical status, I feel that they are at moderate risk for cardiac decompensation at this time.  Orders/Follow up: none  Follow up with Dr [name] on August 5 at 9 am.   Today, I have spent  8 minutes with the patient with telehealth technology discussing the above issues.    Jeanmarie Hubert, NP  08/06/2018 9:58 AM   [facility] [address] [facility] [address] [phone] (office) [phone] (fax)

## 2018-08-06 NOTE — Discharge Instructions (Signed)
Epoetin Alfa injection What is this medicine? EPOETIN ALFA (e POE e tin AL fa) helps your body make more red blood cells. This medicine is used to treat anemia caused by chronic kidney disease, cancer chemotherapy, or HIV-therapy. It may also be used before surgery if you have anemia. This medicine may be used for other purposes; ask your health care provider or pharmacist if you have questions. COMMON BRAND NAME(S): Epogen, Procrit, Retacrit What should I tell my health care provider before I take this medicine? They need to know if you have any of these conditions: -cancer -heart disease -high blood pressure -history of blood clots -history of stroke -low levels of folate, iron, or vitamin B12 in the blood -seizures -an unusual or allergic reaction to erythropoietin, albumin, benzyl alcohol, hamster proteins, other medicines, foods, dyes, or preservatives -pregnant or trying to get pregnant -breast-feeding How should I use this medicine? This medicine is for injection into a vein or under the skin. It is usually given by a health care professional in a hospital or clinic setting. If you get this medicine at home, you will be taught how to prepare and give this medicine. Use exactly as directed. Take your medicine at regular intervals. Do not take your medicine more often than directed. It is important that you put your used needles and syringes in a special sharps container. Do not put them in a trash can. If you do not have a sharps container, call your pharmacist or healthcare provider to get one. A special MedGuide will be given to you by the pharmacist with each prescription and refill. Be sure to read this information carefully each time. Talk to your pediatrician regarding the use of this medicine in children. While this drug may be prescribed for selected conditions, precautions do apply. Overdosage: If you think you have taken too much of this medicine contact a poison control center  or emergency room at once. NOTE: This medicine is only for you. Do not share this medicine with others. What if I miss a dose? If you miss a dose, take it as soon as you can. If it is almost time for your next dose, take only that dose. Do not take double or extra doses. What may interact with this medicine? Interactions have not been studied. This list may not describe all possible interactions. Give your health care provider a list of all the medicines, herbs, non-prescription drugs, or dietary supplements you use. Also tell them if you smoke, drink alcohol, or use illegal drugs. Some items may interact with your medicine. What should I watch for while using this medicine? Your condition will be monitored carefully while you are receiving this medicine. You may need blood work done while you are taking this medicine. This medicine may cause a decrease in vitamin B6. You should make sure that you get enough vitamin B6 while you are taking this medicine. Discuss the foods you eat and the vitamins you take with your health care professional. What side effects may I notice from receiving this medicine? Side effects that you should report to your doctor or health care professional as soon as possible: -allergic reactions like skin rash, itching or hives, swelling of the face, lips, or tongue -seizures -signs and symptoms of a blood clot such as breathing problems; changes in vision; chest pain; severe, sudden headache; pain, swelling, warmth in the leg; trouble speaking; sudden numbness or weakness of the face, arm or leg -signs and symptoms of a stroke  like changes in vision; confusion; trouble speaking or understanding; severe headaches; sudden numbness or weakness of the face, arm or leg; trouble walking; dizziness; loss of balance or coordination Side effects that usually do not require medical attention (report to your doctor or health care professional if they continue or are  bothersome): -chills -cough -dizziness -fever -headaches -joint pain -muscle cramps -muscle pain -nausea, vomiting -pain, redness, or irritation at site where injected This list may not describe all possible side effects. Call your doctor for medical advice about side effects. You may report side effects to FDA at 1-800-FDA-1088. Where should I keep my medicine? Keep out of the reach of children. Store in a refrigerator between 2 and 8 degrees C (36 and 46 degrees F). Do not freeze or shake. Throw away any unused portion if using a single-dose vial. Multi-dose vials can be kept in the refrigerator for up to 21 days after the initial dose. Throw away unused medicine. NOTE: This sheet is a summary. It may not cover all possible information. If you have questions about this medicine, talk to your doctor, pharmacist, or health care provider.  2019 Elsevier/Gold Standard (2016-09-19 08:35:19)

## 2018-08-06 NOTE — Progress Notes (Signed)
PATIENT CARE CENTER NOTE  Diagnosis: Anemia associated with Chronic Renal Failure, anemia associated with renal disease  Provider: Elmarie Shiley MD   Procedure: Epoetin Alfa (Procrit) injection   Note: Patient received sub-q Procrit injection in right arm. Tolerated well. Labs drawn pre-injection and Hemoglobin was 10.5. Patient BP was 121/69.  Discharge instructions given. Patient alert, oriented and ambulatory at discharge.

## 2018-08-20 ENCOUNTER — Ambulatory Visit (INDEPENDENT_AMBULATORY_CARE_PROVIDER_SITE_OTHER): Payer: Medicare Other | Admitting: Family

## 2018-08-20 ENCOUNTER — Ambulatory Visit
Admission: RE | Admit: 2018-08-20 | Discharge: 2018-08-20 | Disposition: A | Discharge status: Home / Self Care | Payer: Medicare Other | Source: Ambulatory Visit | Attending: Family | Admitting: Family

## 2018-08-20 ENCOUNTER — Other Ambulatory Visit: Payer: Self-pay

## 2018-08-20 ENCOUNTER — Encounter: Payer: Self-pay | Admitting: Family

## 2018-08-20 VITALS — BP 140/80 | HR 99 | Temp 98.7°F | Ht 76.0 in | Wt 165.0 lb

## 2018-08-20 DIAGNOSIS — I5042 Chronic combined systolic (congestive) and diastolic (congestive) heart failure: Secondary | ICD-10-CM | POA: Diagnosis not present

## 2018-08-20 DIAGNOSIS — R0602 Shortness of breath: Secondary | ICD-10-CM | POA: Diagnosis not present

## 2018-08-20 NOTE — Progress Notes (Signed)
\Provider: Shanan Fitzpatrick FNP-C  Gayland Curry, DO  Patient Care Team: Gayland Curry, DO as PCP - General (Geriatric Medicine) Martinique, Peter M, MD as PCP - Cardiology (Cardiology) Elmarie Shiley, MD (Nephrology) Milus Banister, MD (Gastroenterology) Pleasant, Eppie Gibson, RN as [place] Management  Extended Emergency Contact Information Primary Emergency Contact: [place] Mobile Phone: [phone] Relation: Sister   Goals of care: Advanced Directive information Advanced Directives 07/06/2018  Does Patient Have a Medical Advance Directive? Yes  Type of Paramedic of [place];Living will  Does patient want to make changes to medical advance directive? No - Patient declined  Copy of [place] in Chart? No - copy requested  Would patient like information on creating a medical advance directive? -  Pre-existing out of facility DNR order (yellow form or pink MOST form) -     Chief Complaint  Patient presents with  . Acute Visit    SOB at night    HPI:  Pt is a 73 y.o. male seen today for an acute visit for evaluation of  Shortness of breath for the past two nights.He states was wheezing last night unable to sleep despite using two pillows.He took his furosemide 20 mg tablet today with much improvement.He states had albuterol given in ED in the past but didn't relief the symptoms.He does have swelling on his legs.No abrupt weight changes.He states prednisone and Amlodipine were discontinued by Dr.Patel. He denies any fever,chills or worsening cough.He continues to follow up with cardiology states has upcoming procedure coming up ? U/S   Past Medical History:  Diagnosis Date  . Anemia   . Anxiety   . Benign paroxysmal positional vertigo   . Bipolar I disorder, most recent episode (or current) unspecified   . Celiac disease   . Cellulitis and abscess of unspecified site   . Chronic kidney disease, stage III  (moderate) (HCC)   . Crohn's    Remicade q8 weeks  . Depression   . Dizziness and giddiness   . Essential and other specified forms of tremor    medication-induced Parkinson's, now resolved  . Essential hypertension, benign   . Gout 2018  . Hypertrophy of prostate without urinary obstruction and other lower urinary tract symptoms (LUTS)   . Hypopotassemia   . Impotence of organic origin   . Insomnia with sleep apnea, unspecified   . Iron deficiency anemia, unspecified   . Loss of weight   . Malnutrition of mild degree ([place])   . 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 Neuralgia, neuritis, and radiculitis, unspecified   . Other B-complex deficiencies   . Other extrapyramidal disease and abnormal movement disorder   . Postinflammatory pulmonary fibrosis ([place])   . Tobacco use disorder   . Vertigo 2018  . Viral warts, unspecified    Past Surgical History:  Procedure Laterality Date  . CHOLECYSTECTOMY  07-12-2010  . SMALL INTESTINE SURGERY     x 2    Allergies  Allergen Reactions  . Azathioprine     REACTION: affected WBC  . Ciprofloxacin     Pt denies any reaction  . Levaquin [Levofloxacin In D5w]     Pt denies any reaction  . Plendil [Felodipine]     Pt denies any reaction    Outpatient Encounter Medications as of 08/20/2018  Medication Sig  . allopurinol (ZYLOPRIM) 100 MG tablet Take 2 tablets (200 mg total) by mouth daily.  . Calcium Carbonate-Vit D-Min 600-400 MG-UNIT TABS Take  1 tablet by mouth 3 (three) times daily.   . cholecalciferol (VITAMIN D) 1000 units tablet Take 1,000 Units by mouth daily.   . divalproex (DEPAKOTE) 500 MG EC tablet Take 1,500 mg by mouth at bedtime.   Marland Kitchen epoetin alfa (PROCRIT) 19379 UNIT/ML injection Inject 10,000 Units into the skin every 30 (thirty) days.   . ferrous sulfate 325 (65 FE) MG tablet Take 325 mg by mouth 3 (three) times a week.   Marland Kitchen FLOMAX 0.4 MG CAPS capsule Take 1 capsule by mouth daily.  . furosemide (LASIX) 20 MG tablet Take 1 tablet (20  mg total) by mouth 3 (three) times a week. Monday Wednesday and Friday  . inFLIXimab (REMICADE) 100 MG injection Infuse Remicade IV schedule 1 1m/kg every 8 weeks Premedicate with Tylenol 500-6576mby mouth and Benadryl 25-5084my mouth prior to infusion. Last PPD was on 12/2009.   . Loperamide HCl (LOPERAMIDE A-D PO) Take 1 capsule by mouth as needed (loose stools).   . magnesium oxide (MAG-OX) 400 (241.3 Mg) MG tablet Take 2,400 mg by mouth 3 (three) times daily.   . meclizine (ANTIVERT) 25 MG tablet Take 25 mg by mouth 3 (three) times daily as needed for dizziness.   . megestrol (MEGACE) 40 MG/ML suspension Take 40 mLs by mouth daily.  . metoprolol succinate (TOPROL-XL) 25 MG 24 hr tablet Take 25 mg by mouth daily.  . Multiple Vitamin (MULTIVITAMIN) tablet Take 1 tablet by mouth daily.    . OMarland KitchenANZapine (ZYPREXA) 7.5 MG tablet Take 1 tablet (7.5 mg total) by mouth at bedtime.  . oMarland Kitcheneprazole (PRILOSEC) 20 MG capsule Take 20 mg by mouth daily before supper.    . oMarland Kitchenybutynin (DITROPAN-XL) 10 MG 24 hr tablet Take 10 mg by mouth at bedtime.  . polyethylene glycol (MIRALAX / GLYCOLAX) packet Take 17 g by mouth daily as needed for mild constipation.  . vitamin E 400 UNIT capsule Take 400 Units by mouth daily.    . [DISCONTINUED] isosorbide mononitrate (IMDUR) 30 MG 24 hr tablet Take 1 tablet (30 mg total) by mouth daily.  . [DISCONTINUED] metoprolol succinate (TOPROL-XL) 50 MG 24 hr tablet Take 1 tablet (50 mg total) by mouth daily. Take with or immediately following a meal. (Patient taking differently: Take 25 mg by mouth daily. Take with or immediately following a meal. )  . [DISCONTINUED] prazosin (MINIPRESS) 5 MG capsule Take 5 mg by mouth at bedtime.   No facility-administered encounter medications on file as of 08/20/2018.     Review of Systems  Constitutional: Negative for appetite change, chills, fatigue and fever.  HENT: Negative for congestion, rhinorrhea, sinus pressure, sinus pain,  sneezing and sore throat.   Respiratory: Negative for cough and chest tightness.        Had shortness of breath and wheezing x 2 nights   Cardiovascular: Positive for leg swelling. Negative for chest pain and palpitations.    Immunization History  Administered Date(s) Administered  . Influenza, High Dose Seasonal PF 11/26/2016  . Influenza,inj,Quad PF,6+ Mos 11/13/2014, 01/03/2016  . Influenza-Unspecified 10/25/2012, 11/24/2016, 11/24/2017  . PPD Test 12/30/2010, 01/05/2012, 01/07/2013, 01/13/2014  . Pneumococcal Conjugate-13 08/10/2014  . Pneumococcal Polysaccharide-23 03/05/2004, 01/03/2016, 05/06/2018  . Tdap 05/26/2011   Pertinent  Health Maintenance Due  Topic Date Due  . INFLUENZA VACCINE  09/25/2018  . COLONOSCOPY  09/17/2027  . PNA vac Low Risk Adult  Completed   Fall Risk  06/03/2018 04/30/2018 03/12/2018 02/10/2018 02/02/2018  Falls in the past year? 0  0 1 1 0  Number falls in past yr: 0 0 0 1 -  Injury with Fall? 0 0 0 0 -    Vitals:   08/20/18 1503  BP: 140/80  Pulse: 99  Temp: 98.7 F (37.1 C)  TempSrc: Oral  SpO2: 98%  Weight: 165 lb (74.8 kg)  Height: 6' 4"  (1.93 m)   Body mass index is 20.08 kg/m. Physical Exam Vitals signs reviewed.  Constitutional:      General: He is not in acute distress.    Appearance: He is not ill-appearing.  HENT:     Head: Normocephalic.     Nose: No congestion or rhinorrhea.     Mouth/Throat:     Mouth: Mucous membranes are moist.     Pharynx: Oropharynx is clear. No oropharyngeal exudate or posterior oropharyngeal erythema.  Eyes:     General: No scleral icterus.       Right eye: No discharge.        Left eye: No discharge.     Conjunctiva/sclera: Conjunctivae normal.     Pupils: Pupils are equal, round, and reactive to light.  Neck:     Musculoskeletal: Normal range of motion. No neck rigidity or muscular tenderness.     Vascular: No carotid bruit.  Cardiovascular:     Rate and Rhythm: Normal rate and regular  rhythm.     Pulses: Normal pulses.     Heart sounds: Normal heart sounds. No murmur. No friction rub. No gallop.   Pulmonary:     Effort: Pulmonary effort is normal. No respiratory distress.     Breath sounds: No wheezing, rhonchi or rales.     Comments: Bilateral lung diminished bases  Chest:     Chest wall: No tenderness.  Abdominal:     General: Bowel sounds are normal. There is no distension.     Palpations: Abdomen is soft. There is no mass.     Tenderness: There is no abdominal tenderness. There is no right CVA tenderness, left CVA tenderness, guarding or rebound.  Musculoskeletal: Normal range of motion.        General: No swelling or tenderness.     Comments: Bilateral lower extremities 1-2 + edema.   Lymphadenopathy:     Cervical: No cervical adenopathy.  Skin:    General: Skin is warm and dry.     Coloration: Skin is not pale.     Findings: No bruising, erythema or rash.  Neurological:     Mental Status: He is alert and oriented to person, place, and time.     Cranial Nerves: No cranial nerve deficit.     Sensory: No sensory deficit.     Motor: No weakness.     Gait: Gait normal.  Psychiatric:        Mood and Affect: Mood normal.        Behavior: Behavior normal.        Thought Content: Thought content normal.        Judgment: Judgment normal.     Labs reviewed: Recent Labs    05/08/18 0256 05/09/18 0251 05/10/18 0330 05/12/18 0629 06/14/18 06/15/18 1225 06/22/18 1011  NA 141 143 142 142 141 138 140  K 3.9 3.9 4.1 4.1 5.0 4.9 4.3  CL 108 112* 112* 108  --  102 109  CO2 26 27 25 26   --  25 23  GLUCOSE 99 95 86 120*  --  108* 115*  BUN 28* 26* 20 17 40* 44*  38*  CREATININE 2.13* 1.86* 1.82* 1.77* 3.1* 3.14* 2.23*  CALCIUM 8.5* 8.1* 8.4* 8.6*  --  9.3 9.1  MG 1.9 1.8 1.8  --   --   --   --   PHOS 3.0 3.1 3.0  --   --   --   --    Recent Labs    05/08/18 0256 05/09/18 0251 05/10/18 0330 06/14/18  AST 12* 11* 12* 13*  ALT 7 8 8  7*  ALKPHOS 47 50 45  88  BILITOT 0.6 0.4 0.6  --   PROT 5.8* 5.4* 5.4*  --   ALBUMIN 2.5* 2.5* 2.4*  --    Recent Labs    05/09/18 0251 05/10/18 0330 05/12/18 0629  06/14/18 07/01/18 1100 08/06/18 0852  WBC 8.6 9.4 10.6*  --  13.7  --   --   NEUTROABS 5.0 6.1 5.3  --   --   --   --   HGB 8.8* 9.1* 10.0*   < > 12.1* 10.8* 10.5*  HCT 28.3* 29.4* 34.6*  --  36*  --  33.8*  MCV 106.8* 107.3* 111.6*  --   --   --   --   PLT 156 153 166  --  254  --   --    < > = values in this interval not displayed.   Lab Results  Component Value Date   TSH 0.202 (L) 05/07/2018   Lab Results  Component Value Date   HGBA1C 4.9 08/10/2015   Lab Results  Component Value Date   CHOL 138 05/04/2017   HDL 42 05/04/2017   LDLCALC 71 05/04/2017   TRIG 176 (H) 05/04/2017   CHOLHDL 3.3 05/04/2017    Significant Diagnostic Results in last 30 days:  No results found.  Assessment/Plan Chronic combined systolic and diastolic congestive heart failure (HCC) No abrupt weight gain.bilateral lower extremities 1-2+ edema.Bilateral lung bases diminished.discussed w/patient to take Furosemide 20 mg tablet daily x 3 days then resume 20 mg tablet every three times per week.check weight daily .Instructed to call 9-1-1 if shortness of breath worsen. - DG Chest 2 View; Future  Family/ staff Communication: Reviewed plan of care with patient.  Labs/tests ordered:  - DG Chest 2 View; Future   Sandrea Hughs, NP

## 2018-08-20 NOTE — Patient Instructions (Signed)
1.Take furosemide 20 mg tablet one by mouth daily x 3 days then resume previous 20 mg tablet three time per week.  2. Get chest X-ray done at [facility] to check for Pneumonia or pleural effusion

## 2018-08-31 ENCOUNTER — Encounter (HOSPITAL_COMMUNITY): Payer: Self-pay | Admitting: Emergency Medicine

## 2018-08-31 ENCOUNTER — Inpatient Hospital Stay (HOSPITAL_COMMUNITY): Payer: Medicare Other

## 2018-08-31 ENCOUNTER — Inpatient Hospital Stay (HOSPITAL_COMMUNITY)
Admission: EM | Admit: 2018-08-31 | Discharge: 2018-09-02 | DRG: 291 | Disposition: A | Discharge status: Home / Self Care | Payer: Medicare Other | Attending: Internal Medicine | Admitting: Internal Medicine

## 2018-08-31 ENCOUNTER — Other Ambulatory Visit: Payer: Self-pay

## 2018-08-31 ENCOUNTER — Emergency Department (HOSPITAL_COMMUNITY): Payer: Medicare Other

## 2018-08-31 DIAGNOSIS — I130 Hypertensive heart and chronic kidney disease with heart failure and stage 1 through stage 4 chronic kidney disease, or unspecified chronic kidney disease: Secondary | ICD-10-CM | POA: Diagnosis not present

## 2018-08-31 DIAGNOSIS — R0602 Shortness of breath: Secondary | ICD-10-CM | POA: Diagnosis not present

## 2018-08-31 DIAGNOSIS — K219 Gastro-esophageal reflux disease without esophagitis: Secondary | ICD-10-CM | POA: Diagnosis present

## 2018-08-31 DIAGNOSIS — Z66 Do not resuscitate: Secondary | ICD-10-CM | POA: Diagnosis present

## 2018-08-31 DIAGNOSIS — R062 Wheezing: Secondary | ICD-10-CM | POA: Diagnosis not present

## 2018-08-31 DIAGNOSIS — J9601 Acute respiratory failure with hypoxia: Secondary | ICD-10-CM | POA: Diagnosis present

## 2018-08-31 DIAGNOSIS — E872 Acidosis: Secondary | ICD-10-CM | POA: Diagnosis not present

## 2018-08-31 DIAGNOSIS — N400 Enlarged prostate without lower urinary tract symptoms: Secondary | ICD-10-CM | POA: Diagnosis present

## 2018-08-31 DIAGNOSIS — I5043 Acute on chronic combined systolic (congestive) and diastolic (congestive) heart failure: Secondary | ICD-10-CM | POA: Diagnosis present

## 2018-08-31 DIAGNOSIS — Z833 Family history of diabetes mellitus: Secondary | ICD-10-CM

## 2018-08-31 DIAGNOSIS — Z20828 Contact with and (suspected) exposure to other viral communicable diseases: Secondary | ICD-10-CM | POA: Diagnosis present

## 2018-08-31 DIAGNOSIS — N401 Enlarged prostate with lower urinary tract symptoms: Secondary | ICD-10-CM

## 2018-08-31 DIAGNOSIS — D899 Disorder involving the immune mechanism, unspecified: Secondary | ICD-10-CM | POA: Diagnosis not present

## 2018-08-31 DIAGNOSIS — K900 Celiac disease: Secondary | ICD-10-CM | POA: Diagnosis present

## 2018-08-31 DIAGNOSIS — F319 Bipolar disorder, unspecified: Secondary | ICD-10-CM | POA: Diagnosis present

## 2018-08-31 DIAGNOSIS — R0902 Hypoxemia: Secondary | ICD-10-CM | POA: Diagnosis not present

## 2018-08-31 DIAGNOSIS — I428 Other cardiomyopathies: Secondary | ICD-10-CM | POA: Diagnosis present

## 2018-08-31 DIAGNOSIS — I471 Supraventricular tachycardia: Secondary | ICD-10-CM | POA: Diagnosis present

## 2018-08-31 DIAGNOSIS — M109 Gout, unspecified: Secondary | ICD-10-CM | POA: Diagnosis not present

## 2018-08-31 DIAGNOSIS — R350 Frequency of micturition: Secondary | ICD-10-CM | POA: Diagnosis not present

## 2018-08-31 DIAGNOSIS — J449 Chronic obstructive pulmonary disease, unspecified: Secondary | ICD-10-CM | POA: Diagnosis present

## 2018-08-31 DIAGNOSIS — R0689 Other abnormalities of breathing: Secondary | ICD-10-CM | POA: Diagnosis not present

## 2018-08-31 DIAGNOSIS — I10 Essential (primary) hypertension: Secondary | ICD-10-CM | POA: Diagnosis not present

## 2018-08-31 DIAGNOSIS — I248 Other forms of acute ischemic heart disease: Secondary | ICD-10-CM | POA: Diagnosis not present

## 2018-08-31 DIAGNOSIS — I5042 Chronic combined systolic (congestive) and diastolic (congestive) heart failure: Secondary | ICD-10-CM | POA: Diagnosis not present

## 2018-08-31 DIAGNOSIS — F419 Anxiety disorder, unspecified: Secondary | ICD-10-CM | POA: Diagnosis present

## 2018-08-31 DIAGNOSIS — Z881 Allergy status to other antibiotic agents status: Secondary | ICD-10-CM

## 2018-08-31 DIAGNOSIS — D849 Immunodeficiency, unspecified: Secondary | ICD-10-CM

## 2018-08-31 DIAGNOSIS — D509 Iron deficiency anemia, unspecified: Secondary | ICD-10-CM | POA: Diagnosis present

## 2018-08-31 DIAGNOSIS — N183 Chronic kidney disease, stage 3 unspecified: Secondary | ICD-10-CM | POA: Diagnosis present

## 2018-08-31 DIAGNOSIS — N179 Acute kidney failure, unspecified: Secondary | ICD-10-CM

## 2018-08-31 DIAGNOSIS — K229 Disease of esophagus, unspecified: Secondary | ICD-10-CM | POA: Diagnosis not present

## 2018-08-31 DIAGNOSIS — I472 Ventricular tachycardia: Secondary | ICD-10-CM | POA: Diagnosis present

## 2018-08-31 DIAGNOSIS — D631 Anemia in chronic kidney disease: Secondary | ICD-10-CM | POA: Diagnosis not present

## 2018-08-31 DIAGNOSIS — Z888 Allergy status to other drugs, medicaments and biological substances status: Secondary | ICD-10-CM

## 2018-08-31 DIAGNOSIS — Z79899 Other long term (current) drug therapy: Secondary | ICD-10-CM

## 2018-08-31 DIAGNOSIS — I951 Orthostatic hypotension: Secondary | ICD-10-CM | POA: Diagnosis not present

## 2018-08-31 DIAGNOSIS — Z87891 Personal history of nicotine dependence: Secondary | ICD-10-CM

## 2018-08-31 DIAGNOSIS — Z8049 Family history of malignant neoplasm of other genital organs: Secondary | ICD-10-CM

## 2018-08-31 DIAGNOSIS — D539 Nutritional anemia, unspecified: Secondary | ICD-10-CM

## 2018-08-31 DIAGNOSIS — Z9049 Acquired absence of other specified parts of digestive tract: Secondary | ICD-10-CM

## 2018-08-31 DIAGNOSIS — I5023 Acute on chronic systolic (congestive) heart failure: Secondary | ICD-10-CM | POA: Diagnosis present

## 2018-08-31 DIAGNOSIS — Z825 Family history of asthma and other chronic lower respiratory diseases: Secondary | ICD-10-CM

## 2018-08-31 DIAGNOSIS — J810 Acute pulmonary edema: Secondary | ICD-10-CM

## 2018-08-31 DIAGNOSIS — K5090 Crohn's disease, unspecified, without complications: Secondary | ICD-10-CM | POA: Diagnosis not present

## 2018-08-31 DIAGNOSIS — R000 Tachycardia, unspecified: Secondary | ICD-10-CM | POA: Diagnosis not present

## 2018-08-31 LAB — CBC WITH DIFFERENTIAL/PLATELET
Abs Immature Granulocytes: 0.05 10*3/uL (ref 0.00–0.07)
Basophils Absolute: 0.1 10*3/uL (ref 0.0–0.1)
Basophils Relative: 0 %
Eosinophils Absolute: 0.2 10*3/uL (ref 0.0–0.5)
Eosinophils Relative: 2 %
HCT: 31.5 % — ABNORMAL LOW (ref 39.0–52.0)
Hemoglobin: 9.7 g/dL — ABNORMAL LOW (ref 13.0–17.0)
Immature Granulocytes: 0 %
Lymphocytes Relative: 10 %
Lymphs Abs: 1.2 10*3/uL (ref 0.7–4.0)
MCH: 33.7 pg (ref 26.0–34.0)
MCHC: 30.8 g/dL (ref 30.0–36.0)
MCV: 109.4 fL — ABNORMAL HIGH (ref 80.0–100.0)
Monocytes Absolute: 0.5 10*3/uL (ref 0.1–1.0)
Monocytes Relative: 4 %
Neutro Abs: 10.3 10*3/uL — ABNORMAL HIGH (ref 1.7–7.7)
Neutrophils Relative %: 84 %
Platelets: 199 10*3/uL (ref 150–400)
RBC: 2.88 MIL/uL — ABNORMAL LOW (ref 4.22–5.81)
RDW: 15 % (ref 11.5–15.5)
WBC: 12.3 10*3/uL — ABNORMAL HIGH (ref 4.0–10.5)
nRBC: 0 % (ref 0.0–0.2)

## 2018-08-31 LAB — BASIC METABOLIC PANEL
Anion gap: 5 (ref 5–15)
BUN: 28 mg/dL — ABNORMAL HIGH (ref 8–23)
CO2: 18 mmol/L — ABNORMAL LOW (ref 22–32)
Calcium: 7 mg/dL — ABNORMAL LOW (ref 8.9–10.3)
Chloride: 119 mmol/L — ABNORMAL HIGH (ref 98–111)
Creatinine, Ser: 1.73 mg/dL — ABNORMAL HIGH (ref 0.61–1.24)
GFR calc Af Amer: 44 mL/min — ABNORMAL LOW (ref >60)
GFR calc non Af Amer: 38 mL/min — ABNORMAL LOW (ref >60)
Glucose, Bld: 104 mg/dL — ABNORMAL HIGH (ref 70–99)
Potassium: 3.9 mmol/L (ref 3.5–5.1)
Sodium: 142 mmol/L (ref 135–145)

## 2018-08-31 LAB — SARS CORONAVIRUS 2 BY RT PCR (HOSPITAL ORDER, PERFORMED IN ~~LOC~~ HOSPITAL LAB): SARS Coronavirus 2: NEGATIVE

## 2018-08-31 LAB — TROPONIN I (HIGH SENSITIVITY)
Troponin I (High Sensitivity): 69 ng/L — ABNORMAL HIGH (ref <18)
Troponin I (High Sensitivity): 92 ng/L — ABNORMAL HIGH (ref <18)
Troponin I (High Sensitivity): 89 ng/L — ABNORMAL HIGH (ref <18)

## 2018-08-31 LAB — BRAIN NATRIURETIC PEPTIDE: B Natriuretic Peptide: 1257.9 pg/mL — ABNORMAL HIGH (ref 0.0–100.0)

## 2018-08-31 MED ORDER — POLYETHYLENE GLYCOL 3350 17 G PO PACK: 17.0000 g | PACK | Freq: Every day | ORAL | Status: DC | PRN

## 2018-08-31 MED ORDER — IPRATROPIUM BROMIDE 0.02 % IN SOLN: 0.5000 mg | Freq: Four times a day (QID) | RESPIRATORY_TRACT | Status: DC | PRN

## 2018-08-31 MED ORDER — PANTOPRAZOLE SODIUM 40 MG PO TBEC
40.0000 mg | DELAYED_RELEASE_TABLET | Freq: Every day | ORAL | Status: DC
  Administered 2018-08-31 – 2018-09-02 (×3): 40 mg via ORAL
  Filled 2018-08-31 (×3): qty 1

## 2018-08-31 MED ORDER — DIVALPROEX SODIUM 250 MG PO DR TAB
1500.0000 mg | DELAYED_RELEASE_TABLET | Freq: Every day | ORAL | Status: DC
  Administered 2018-08-31 – 2018-09-01 (×2): 1500 mg via ORAL
  Filled 2018-08-31 (×2): qty 6

## 2018-08-31 MED ORDER — OXYBUTYNIN CHLORIDE ER 5 MG PO TB24
10.0000 mg | ORAL_TABLET | Freq: Every day | ORAL | Status: DC
  Administered 2018-08-31 – 2018-09-01 (×2): 10 mg via ORAL
  Filled 2018-08-31 (×2): qty 2

## 2018-08-31 MED ORDER — FUROSEMIDE 10 MG/ML IJ SOLN
40.0000 mg | Freq: Two times a day (BID) | INTRAMUSCULAR | Status: DC
  Administered 2018-08-31 – 2018-09-01 (×4): 40 mg via INTRAVENOUS
  Filled 2018-08-31 (×4): qty 4

## 2018-08-31 MED ORDER — HEPARIN SODIUM (PORCINE) 5000 UNIT/ML IJ SOLN
5000.0000 [IU] | Freq: Three times a day (TID) | INTRAMUSCULAR | Status: DC
  Administered 2018-08-31 – 2018-09-02 (×7): 5000 [IU] via SUBCUTANEOUS
  Filled 2018-08-31 (×7): qty 1

## 2018-08-31 MED ORDER — METOPROLOL SUCCINATE ER 25 MG PO TB24
25.0000 mg | ORAL_TABLET | Freq: Every evening | ORAL | Status: DC
  Administered 2018-08-31 – 2018-09-01 (×2): 25 mg via ORAL
  Filled 2018-08-31 (×2): qty 1

## 2018-08-31 MED ORDER — HYDRALAZINE HCL 10 MG PO TABS
10.0000 mg | ORAL_TABLET | Freq: Three times a day (TID) | ORAL | Status: DC
  Administered 2018-08-31 – 2018-09-01 (×4): 10 mg via ORAL
  Filled 2018-08-31 (×4): qty 1

## 2018-08-31 MED ORDER — OLANZAPINE 5 MG PO TABS
7.5000 mg | ORAL_TABLET | Freq: Every day | ORAL | Status: DC
  Administered 2018-08-31 – 2018-09-01 (×2): 7.5 mg via ORAL
  Filled 2018-08-31 (×2): qty 2

## 2018-08-31 MED ORDER — ONDANSETRON HCL 4 MG/2ML IJ SOLN: 4.0000 mg | Freq: Four times a day (QID) | INTRAMUSCULAR | Status: DC | PRN

## 2018-08-31 MED ORDER — ALLOPURINOL 100 MG PO TABS
100.0000 mg | ORAL_TABLET | Freq: Every day | ORAL | Status: DC
  Administered 2018-08-31 – 2018-09-02 (×3): 100 mg via ORAL
  Filled 2018-08-31 (×3): qty 1

## 2018-08-31 MED ORDER — SODIUM CHLORIDE 0.9 % IV SOLN: 250.0000 mL | INTRAVENOUS | Status: DC | PRN

## 2018-08-31 MED ORDER — ISOSORBIDE MONONITRATE ER 30 MG PO TB24
30.0000 mg | ORAL_TABLET | Freq: Every day | ORAL | Status: DC
  Administered 2018-08-31 – 2018-09-02 (×3): 30 mg via ORAL
  Filled 2018-08-31 (×3): qty 1

## 2018-08-31 MED ORDER — ALPRAZOLAM 0.25 MG PO TABS
0.2500 mg | ORAL_TABLET | Freq: Two times a day (BID) | ORAL | Status: DC | PRN
  Administered 2018-08-31 – 2018-09-02 (×2): 0.25 mg via ORAL
  Filled 2018-08-31 (×2): qty 1

## 2018-08-31 MED ORDER — LEVALBUTEROL HCL 0.63 MG/3ML IN NEBU: 0.6300 mg | INHALATION_SOLUTION | Freq: Four times a day (QID) | RESPIRATORY_TRACT | Status: DC | PRN

## 2018-08-31 MED ORDER — MEGESTROL ACETATE 400 MG/10ML PO SUSP
1600.0000 mg | Freq: Every day | ORAL | Status: DC
  Administered 2018-08-31 – 2018-09-02 (×3): 1600 mg via ORAL
  Filled 2018-08-31 (×3): qty 40

## 2018-08-31 MED ORDER — LEVALBUTEROL TARTRATE 45 MCG/ACT IN AERO: 2.0000 | INHALATION_SPRAY | Freq: Four times a day (QID) | RESPIRATORY_TRACT | Status: DC | PRN

## 2018-08-31 MED ORDER — ACETAMINOPHEN 325 MG PO TABS: 650.0000 mg | ORAL_TABLET | ORAL | Status: DC | PRN

## 2018-08-31 MED ORDER — SODIUM CHLORIDE 0.9% FLUSH: 3.0000 mL | INTRAVENOUS | Status: DC | PRN

## 2018-08-31 MED ORDER — VITAMIN D 25 MCG (1000 UNIT) PO TABS
1000.0000 [IU] | ORAL_TABLET | Freq: Every day | ORAL | Status: DC
  Administered 2018-08-31 – 2018-09-02 (×3): 1000 [IU] via ORAL
  Filled 2018-08-31 (×3): qty 1

## 2018-08-31 MED ORDER — SODIUM CHLORIDE 0.9% FLUSH
3.0000 mL | Freq: Two times a day (BID) | INTRAVENOUS | Status: DC
  Administered 2018-08-31 – 2018-09-02 (×5): 3 mL via INTRAVENOUS

## 2018-08-31 MED ORDER — FUROSEMIDE 10 MG/ML IJ SOLN
40.0000 mg | INTRAMUSCULAR | Status: AC
  Administered 2018-08-31: 40 mg via INTRAVENOUS
  Filled 2018-08-31: qty 4

## 2018-08-31 MED ORDER — FERROUS SULFATE 325 (65 FE) MG PO TABS
325.0000 mg | ORAL_TABLET | ORAL | Status: DC
  Administered 2018-09-01: 325 mg via ORAL
  Filled 2018-08-31 (×2): qty 1

## 2018-08-31 NOTE — ED Notes (Signed)
Bed: WA20 Expected date:  Expected time:  Means of arrival:  Comments: 72 yr old asthma

## 2018-08-31 NOTE — H&P (Signed)
History and Physical    Brent [name] DGL:[id number] DOB: 06-05-1945 DOA: 08/31/2018  PCP: Gayland Curry, DO   Patient coming from: Home  Chief Complaint: SOB, Diaphoresis   HPI: Brent [name] is a 73 y.o. male with medical history significant for but not limited to gout, BPPV, bipolar 1 disorder, celiac disease and Crohn's disease on Remicade every 8 weeks, CKD stage III, essential hypertension, iron deficiency anemia with Procrit injections, chronic combined systolic and diastolic CHF, neuralgia, history of narcolepsy, history of postinflammatory pulmonary fibrosis, gout, depression, as well as other comorbidities who presented with a chief complaint of shortness of breath which acutely worsened last night associated with diaphoresis.  Patient states that he has been short of breath since March however he states it is gotten progressively worse and he had an inability to lay flat.  He also noticed that his legs were little bit more swollen specifically his feet.  Recently he was changed from p.o. Lasix every day to Monday Wednesday Friday by his nephrologist Dr. Posey Pronto.  Patient states that almost every night he becomes short of breath and he takes a breathing treatment through his nebulizer and improved however last night he took 2-3 without improvement and because he became so dyspneic he called EMS and was brought to the emergency room for further evaluation recommendation.  Denies any chest pain currently but did state that he had some diaphoresis.  No nausea or vomiting.  Feels better after getting Solu-Medrol and Lasix.  TRH was asked to admit this patient for acute respiratory failure with hypoxia in the setting of acute on chronic combined systolic and diastolic CHF.  ED Course: In the ED the patient had basic blood work done as well as a chest x-ray and an [place] 2 testing which was negative.  Patient was given a dose of IV Lasix as well as IV Solu-Medrol and was placed on 15 L  nonrebreather and titrated off.  Blood work showed an elevated BNP and elevated troponin.  Patient was also given 2 puffs of Aleve albuterol inhaler.  Review of Systems: As per HPI otherwise all other systems reviewed and negative.   Past Medical History:  Diagnosis Date  . Anemia   . Anxiety   . Benign paroxysmal positional vertigo   . Bipolar I disorder, most recent episode (or current) unspecified   . Celiac disease   . Cellulitis and abscess of unspecified site   . Chronic kidney disease, stage III (moderate) (HCC)   . Crohn's    Remicade q8 weeks  . Depression   . Dizziness and giddiness   . Essential and other specified forms of tremor    medication-induced Parkinson's, now resolved  . Essential hypertension, benign   . Gout 2018  . Hypertrophy of prostate without urinary obstruction and other lower urinary tract symptoms (LUTS)   . Hypopotassemia   . Impotence of organic origin   . Insomnia with sleep apnea, unspecified   . Iron deficiency anemia, unspecified   . Loss of weight   . Malnutrition of mild degree ([place])   . 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 Neuralgia, neuritis, and radiculitis, unspecified   . Other B-complex deficiencies   . Other extrapyramidal disease and abnormal movement disorder   . Postinflammatory pulmonary fibrosis ([place])   . Tobacco use disorder   . Vertigo 2018  . Viral warts, unspecified    Past Surgical History:  Procedure Laterality Date  . CHOLECYSTECTOMY  07-12-2010  . SMALL INTESTINE  SURGERY     x 2   SOCIAL HISTORY   reports that he quit smoking about 4 years ago. His smoking use included cigarettes. He started smoking about 62 years ago. He has a 49.00 pack-year smoking history. He has never used smokeless tobacco. He reports that he does not drink alcohol or use drugs.  Allergies  Allergen Reactions  . Azathioprine Other (See Comments)    REACTION: affected WBC  . Ciprofloxacin Other (See Comments)    Unknown rxn  . Levaquin [Levofloxacin  In D5w] Other (See Comments)    Unknown rxn  . Plendil [Felodipine] Other (See Comments)    Unknown rxn   Family History  Problem Relation Age of Onset  . Diabetes Mother        maternal grandmother  . Uterine cancer Mother   . Emphysema Father   . Pneumonia Maternal Grandmother   . Colon cancer Neg Hx    Prior to Admission medications   Medication Sig Start Date End Date Taking? Authorizing Provider  allopurinol (ZYLOPRIM) 100 MG tablet Take 2 tablets (200 mg total) by mouth daily. Patient taking differently: Take 100 mg by mouth daily.  04/10/16  Yes Lauree Chandler, NP  cholecalciferol (VITAMIN D) 1000 units tablet Take 1,000 Units by mouth daily.    Yes [provider]  divalproex (DEPAKOTE) 500 MG EC tablet Take 1,500 mg by mouth at bedtime.    Yes [provider]  epoetin alfa (PROCRIT) 37342 UNIT/ML injection Inject 10,000 Units into the skin every 30 (thirty) days.    Yes [provider]  ferrous sulfate 325 (65 FE) MG tablet Take 325 mg by mouth every Monday, Wednesday, and Friday.    Yes [provider]  furosemide (LASIX) 20 MG tablet Take 1 tablet (20 mg total) by mouth 3 (three) times a week. Monday Wednesday and Friday 07/09/18  Yes Georgiana Shore, NP  inFLIXimab (REMICADE) 100 MG injection Infuse Remicade IV schedule 1 17m/kg every 8 weeks Premedicate with Tylenol 500-6556mby mouth and Benadryl 25-501my mouth prior to infusion. Last PPD was on 12/2009.  11/22/10  Yes JacMilus BanisterD  loperamide (IMODIUM) 2 MG capsule Take 4 mg by mouth daily as needed for diarrhea or loose stools.   Yes [provider]  megestrol (MEGACE) 40 MG/ML suspension Take 40 mLs by mouth daily. 08/05/18  Yes [provider]  metoprolol succinate (TOPROL-XL) 25 MG 24 hr tablet Take 25 mg by mouth every evening.    Yes [provider]  OLANZapine (ZYPREXA) 7.5 MG tablet Take 1 tablet (7.5 mg total) by mouth at bedtime. 01/03/16   Yes Reed, Tiffany L, DO  omeprazole (PRILOSEC) 20 MG capsule Take 20 mg by mouth daily before supper.     Yes [provider]  oxybutynin (DITROPAN-XL) 10 MG 24 hr tablet Take 10 mg by mouth at bedtime.   Yes [provider]  polyethylene glycol (MIRALAX / GLYCOLAX) packet Take 17 g by mouth daily as needed for mild constipation. 05/10/18  Yes She[place]iford [place] Exam: Vitals:   08/31/18 0500 08/31/18 0530 08/31/18 0531 08/31/18 0558  BP: (!) 160/103 (!) 182/95 (!) 147/100 (!) 148/93  Pulse: (!) 124 (!) 121 (!) 117 (!) 118  Resp: 20 19 19 20   Temp:   98.2 F (36.8 C) 98.2 F (36.8 C)  TempSrc:   Oral Oral  SpO2: 97% 100% 98% 95%   Constitutional: WN/WD AAM in  NAD and appears calm  Eyes: Lids and conjunctivae normal, sclerae anicteric  ENMT: External Ears, Nose appear normal. Grossly normal hearing. Mucous membranes are moist. Neck: Appears normal, supple, no cervical masses, normal ROM, no appreciable thyromegaly; mild JVD Respiratory: Diminished to auscultation bilaterally with coarse breath sounds and mild crackles; no appreciable wheezing, rales, rhonchi. Normal respiratory effort and patient is not tachypenic. No accessory muscle use. Not wearing supplemental O2 via  on my examination Cardiovascular: Tachycardic Rate but regular rhythm, 1+ LE extremity edema. 2+ pedal pulses.  Abdomen: Soft, non-tender, non-distended. Has Scars from prior surgeries. Bowel sounds positive x4.  GU: Deferred. Musculoskeletal: No clubbing / cyanosis of digits/nails. No joint deformity upper and lower extremities.  Skin: No rashes, lesions, ulcers on a limited skin evaluation. No induration; Warm and dry.  Neurologic: CN 2-12 grossly intact with no focal deficits. Romberg sign and cerebellar reflexes not assessed.  Psychiatric: Normal judgment and insight. Alert and oriented x 3. Pleasant mood and appropriate affect.   Labs on Admission: I have personally reviewed  following labs and imaging studies  CBC: Recent Labs  Lab 08/31/18 0134  WBC 12.3*  NEUTROABS 10.3*  HGB 9.7*  HCT 31.5*  MCV 109.4*  PLT 262   Basic Metabolic Panel: Recent Labs  Lab 08/31/18 0134  NA 142  K 3.9  CL 119*  CO2 18*  GLUCOSE 104*  BUN 28*  CREATININE 1.73*  CALCIUM 7.0*   GFR: Estimated Creatinine Clearance: 40.2 mL/min (A) (by C-G formula based on SCr of 1.73 mg/dL (H)). Liver Function Tests: No results for input(s): AST, ALT, ALKPHOS, BILITOT, PROT, ALBUMIN in the last 168 hours. No results for input(s): LIPASE, AMYLASE in the last 168 hours. No results for input(s): AMMONIA in the last 168 hours. Coagulation Profile: No results for input(s): INR, PROTIME in the last 168 hours. Cardiac Enzymes: No results for input(s): CKTOTAL, CKMB, CKMBINDEX, TROPONINI in the last 168 hours. BNP (last 3 results) No results for input(s): PROBNP in the last 8760 hours. HbA1C: No results for input(s): HGBA1C in the last 72 hours. CBG: No results for input(s): GLUCAP in the last 168 hours. Lipid Profile: No results for input(s): CHOL, HDL, LDLCALC, TRIG, CHOLHDL, LDLDIRECT in the last 72 hours. Thyroid Function Tests: No results for input(s): TSH, T4TOTAL, FREET4, T3FREE, THYROIDAB in the last 72 hours. Anemia Panel: No results for input(s): VITAMINB12, FOLATE, FERRITIN, TIBC, IRON, RETICCTPCT in the last 72 hours. Urine analysis:    Component Value Date/Time   COLORURINE YELLOW 02/07/2018 [place] 02/07/2018 1255   LABSPEC 1.017 02/07/2018 1255   PHURINE 5.0 02/07/2018 1255   GLUCOSEU NEGATIVE 02/07/2018 1255   HGBUR NEGATIVE 02/07/2018 [place] 02/07/2018 1255   BILIRUBINUR neg 10/03/2014 1152   KETONESUR 5 (A) 02/07/2018 1255   PROTEINUR NEGATIVE 02/07/2018 1255   UROBILINOGEN negative 10/03/2014 1152   UROBILINOGEN 0.2 07/10/2010 1709   NITRITE NEGATIVE 02/07/2018 1255   LEUKOCYTESUR NEGATIVE 02/07/2018 1255    Sepsis Labs: !!!!!!!!!!!!!!!!!!!!!!!!!!!!!!!!!!!!!!!!!!!! @LABRCNTIP (procalcitonin:4,lacticidven:4) ) Recent Results (from the past 240 hour(s))  SARS Coronavirus 2 (CEPHEID- Performed in [facility] hospital lab), Hosp Order     Status: None   Collection Time: 08/31/18  1:34 AM   Specimen: Nasopharyngeal Swab  Result Value Ref Range Status   SARS Coronavirus 2 NEGATIVE NEGATIVE Final    Comment: (NOTE) If result is NEGATIVE SARS-CoV-2 target nucleic acids are NOT DETECTED. The SARS-CoV-2 RNA is generally detectable in upper and lower  respiratory specimens during the acute phase of infection. The lowest  concentration of SARS-CoV-2 viral copies this assay can detect is 250  copies / mL. A negative result does not preclude SARS-CoV-2 infection  and should not be used as the sole basis for treatment or other  patient management decisions.  A negative result may occur with  improper specimen collection / handling, submission of specimen other  than nasopharyngeal swab, presence of viral mutation(s) within the  areas targeted by this assay, and inadequate number of viral copies  (<250 copies / mL). A negative result must be combined with clinical  observations, patient history, and epidemiological information. If result is POSITIVE SARS-CoV-2 target nucleic acids are DETECTED. The SARS-CoV-2 RNA is generally detectable in upper and lower  respiratory specimens dur ing the acute phase of infection.  Positive  results are indicative of active infection with SARS-CoV-2.  Clinical  correlation with patient history and other diagnostic information is  necessary to determine patient infection status.  Positive results do  not rule out bacterial infection or co-infection with other viruses. If result is PRESUMPTIVE POSTIVE SARS-CoV-2 nucleic acids MAY BE PRESENT.   A presumptive positive result was obtained on the submitted specimen  and confirmed on repeat testing.  While 2019 novel  coronavirus  (SARS-CoV-2) nucleic acids may be present in the submitted sample  additional confirmatory testing may be necessary for epidemiological  and / or clinical management purposes  to differentiate between  SARS-CoV-2 and other Sarbecovirus currently known to infect humans.  If clinically indicated additional testing with an alternate test  methodology [phone]) is advised. The SARS-CoV-2 RNA is generally  detectable in upper and lower respiratory sp ecimens during the acute  phase of infection. The expected result is Negative. Fact Sheet for Patients:  StrictlyIdeas.no Fact Sheet for Healthcare Providers: BankingDealers.co.za This test is not yet approved or cleared by the [place] FDA and has been authorized for detection and/or diagnosis of SARS-CoV-2 by FDA under an Emergency Use Authorization (EUA).  This EUA will remain in effect (meaning this test can be used) for the duration of the COVID-19 declaration under Section 564(b)(1) of the Act, 21 U.S.C. section 360bbb-3(b)(1), unless the authorization is terminated or revoked sooner. Performed at [facility], [place] [address], [place], [place] [address]     Radiological Exams on Admission: Dg Chest Port 1 View  Result Date: 08/31/2018 CLINICAL DATA:  Shortness of breath and wheezing EXAM: PORTABLE CHEST 1 VIEW COMPARISON:  Chest x-ray dated August 20, 2018 FINDINGS: The heart size is enlarged. There are new ground-glass airspace opacities bilaterally. There is no pneumothorax. No large pleural effusion. There is no acute osseous abnormality. IMPRESSION: Cardiomegaly with findings suspicious for developing pulmonary edema. An atypical infectious process can have a similar appearance in the appropriate clinical setting. Electronically Signed   By: Constance Holster M.D.   On: 08/31/2018 02:22   EKG: Independently reviewed.  Showed sinus tachycardia rate of 123  and LVH but no evidence of ST elevation or depression on my interpretation.  QTC was 468.  Assessment/Plan Principal Problem:   Acute on chronic combined systolic (congestive) and diastolic (congestive) heart failure (HCC) Active Problems:   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HCC)   Chronic kidney disease (CKD), stage III (moderate) (HCC)   BPH (benign prostatic hyperplasia)   Essential hypertension, benign   Acute respiratory failure with hypoxia (HCC)   Chronic combined systolic and diastolic congestive heart failure (HCC)   Acute on chronic  systolic (congestive) heart failure (HCC)  Acute Respiratory Failure with Hypoxia 2/2 to Acute on Chronic Combined Systolic and Diastolic CHF Exacerbation, improved -Presented from home and has been short of breath all day taking albuterol inhaler and Nebulizers without relief -In the ED he was given 125 mg of Solu-Medrol, 2 g of mag sulfate and he was taking albuterol 50 mg inhaler today -He was placed on 15 L nonrebreather and saturating 97% and -SARS-CoV-2 testing was negative -Chest x-ray as below -We will obtain CT of the chest without contrast to further delineate CXR findings -Given IV Lasix 40 mg in the ED with improvement -Will start Scheduled IV Lasix 40 mg BID  -Continue supplemental oxygen via nasal cannula if necessary and wean O2 as tolerated to room air -Continuous pulse oximetry and maintain O2 saturation greater than 92% -Repeat chest x-ray in the a.m.  Acute Decompensation of Chronic Systolic and Diastolic CHF -Patient with significant respiratory distress on admission -BNP on Admission was 1257 -Takes p.o. Lasix only on Monday Wednesday and Friday has was changed from p.o. Lasix daily to Monday Wednesday Friday by his nephrologist Dr. Posey Pronto; currently we will hold his 20 mg p.o. 3 times a week -CXR showed "The heart size is enlarged. There are new ground-glass airspace opacities bilaterally. There is no pneumothorax. No large pleural  effusion. There is no acute osseous abnormality." Read Impression was "Cardiomegaly with findings suspicious for developing pulmonary edema. An atypical infectious process can have a similar appearance in the appropriate clinical setting." -Check CT of Chest w/o Contrast to further delineate groundglass opacities and better evaluate his chest -Will continue [place] O2if necessary but this has been weaned to Room Air currently. -ECHOCardiogram from 5/46/5035  showing Systolic and Diastolic Dysfunction and LV Diffuse Hypokinesis; last EF was 30 to 35% -Strict I's/O's and Daily Weights and Fluid Restrict to 1500 mL; -Patient  -Continue to Monitor for Volume Overload  -We will consult cardiology for further evaluation and recommendations  Elevated Troponin -Patient's high-sensitivity troponin on admission was 69 and repeat was 92.0 and now is 89.0 -In the Setting of CHF -Currently not complaining of any chest pain -We will have cardiology evaluate for further evaluation recommendations  Crohn's Disease -He receives Remicade infusions and so is immunocompromised.  We will currently hold his infliximab -We will need to continue loperamide as needed for diarrhea or loose stools -Currently without concern for flare. -Closely Monitor   Stage 3 CKD  Metabolic Acidosis -BUN/Cr on Admission was 28/1.73 -CO2 on admission was 18 and anion gap was 5; likely in the setting of renal disease -Avoid Nephrotoxic Medications, Contrast Dyes, and Hypotension if possible -Repeat CMP as an outpatient   Anemia of CKD/Macrocytic Anemia -On Procrit injections monthly. -Continue with ferrous sulfate 325 mg p.o. every Monday, Wednesday, Friday -Patient's Hb/Hct on Admission was 9.7/31.5 -Check Anemia Panel in the AM -Continue to Monitor for S/Sx of Bleeding; Currently no Overt Bleeding note -Repeat CBC in AM  HTN -C/w Toprol XL 25 mg po Daily -No longer taking isosorbide Mononitrate 30 mg po Daily and  Hydralazine 25 mg po q8h from last admission -We will have cardiology further adjusted medications   Bipolar -Continue Depakote 1500 mg po qHS, Olanzapine 7.5 mg po qHS,   Leukocytosis -Patient's WBC on admission was 12.3 -Questionable related to the IV Solu-Medrol use along with reactive in the setting of CHF -Continue monitor for signs and symptoms of infection -Repeat CBC and trend WBC  GERD -Continue with Omeprazole  substitution with Pantoprazole 40 mg p.o. daily  Gout -Continue Allopurinol 100 mg daily  BPH/Hypertrophy of Prostate without Urinary Obstruction -Not on Prazosin anymore and not taking Tamsulosis -C/w Oxybutynin 10 mg po qHS for Overactive Bladder  DVT prophylaxis: Heparin 5,000 units sq q8h Code Status: DO NOT RESUSCITATE  Family Communication: No family present at bedside  Disposition Plan: Pending Clinical Improvement back to baseline and dry weight Consults called: [facility] Admission status: Inpatient Telemetry   Severity of Illness: The appropriate patient status for this patient is INPATIENT. Inpatient status is judged to be reasonable and necessary in order to provide the required intensity of service to ensure the patient's safety. The patient's presenting symptoms, physical exam findings, and initial radiographic and laboratory data in the context of their chronic comorbidities is felt to place them at high risk for further clinical deterioration. Furthermore, it is not anticipated that the patient will be medically stable for discharge from the hospital within 2 midnights of admission. The following factors support the patient status of inpatient.   " The patient's presenting symptoms include SOB, Diaphoresis. " The worrisome physical exam findings include Tachycardia. " The initial radiographic and laboratory data are worrisome because of Cardiomegaly and Concern for CHF. " The chronic co-morbidities are listed as above in HPI.  * I certify  that at the point of admission it is my clinical judgment that the patient will require inpatient hospital care spanning beyond 2 midnights from the point of admission due to high intensity of service, high risk for further deterioration and high frequency of surveillance required.Kerney Elbe, D.O. [facility] PAGER is on [place]  If 7PM-7AM, please contact night-coverage [email or web address] Password [email or web address]  08/31/2018, 8:29 AM

## 2018-08-31 NOTE — ED Notes (Signed)
ED TO INPATIENT HANDOFF REPORT  ED Nurse Name and Phone #: jon wled    S Name/Age/Gender Brent [name] 73 y.o. male Room/Bed: WA20/WA20  Code Status   Code Status: Prior  Home/SNF/Other Home Patient oriented to: self, place, time and situation Is this baseline? Yes   Triage Complete: Triage complete  Chief Complaint Shortness of breath  Triage Note Pt comes to ed from home, pt has been sob all day using his home albuterol inhaler without relief. Called ems at 12:23:32 am  Ems arrived iv 18 LAC, 125 solumedrol, 2 grams mag drip administered. Pt self administered albuterol today 15 mg inhaler.  Pt walks and alert x4, v/s on arrival 162/107 hr 136, SpO2 97 15 liters non re breather,  Temp 98.7.    Allergies Allergies  Allergen Reactions  . Azathioprine     REACTION: affected WBC  . Ciprofloxacin     Pt denies any reaction  . Levaquin [Levofloxacin In D5w]     Pt denies any reaction  . Plendil [Felodipine]     Pt denies any reaction    Level of Care/Admitting Diagnosis ED Disposition    ED Disposition Condition Comment   Admit  Hospital Area: [facility] [[id number]]  Level of Care: Telemetry [5]  Admit to tele based on following criteria: Acute CHF  Covid Evaluation: N/A  Diagnosis: Acute on chronic systolic (congestive) heart failure [facility]) [[id number]]  Admitting Physician: Ivor Costa [[id number]]  Attending Physician: Ivor Costa [phone]  Estimated length of stay: past midnight tomorrow  Certification:: I certify this patient will need inpatient services for at least 2 midnights  PT Class (Do Not Modify): Inpatient [101]  PT Acc Code (Do Not Modify): Private [1]       B Medical/Surgery History Past Medical History:  Diagnosis Date  . Anemia   . Anxiety   . Benign paroxysmal positional vertigo   . Bipolar I disorder, most recent episode (or current) unspecified   . Celiac disease   . Cellulitis and abscess of unspecified site   . Chronic kidney  disease, stage III (moderate) (HCC)   . Crohn's    Remicade q8 weeks  . Depression   . Dizziness and giddiness   . Essential and other specified forms of tremor    medication-induced Parkinson's, now resolved  . Essential hypertension, benign   . Gout 2018  . Hypertrophy of prostate without urinary obstruction and other lower urinary tract symptoms (LUTS)   . Hypopotassemia   . Impotence of organic origin   . Insomnia with sleep apnea, unspecified   . Iron deficiency anemia, unspecified   . Loss of weight   . Malnutrition of mild degree ([place])   . 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 Neuralgia, neuritis, and radiculitis, unspecified   . Other B-complex deficiencies   . Other extrapyramidal disease and abnormal movement disorder   . Postinflammatory pulmonary fibrosis ([place])   . Tobacco use disorder   . Vertigo 2018  . Viral warts, unspecified    Past Surgical History:  Procedure Laterality Date  . CHOLECYSTECTOMY  07-12-2010  . SMALL INTESTINE SURGERY     x 2     A IV Location/Drains/Wounds Patient Lines/Drains/Airways Status   Active Line/Drains/Airways    Name:   Placement date:   Placement time:   Site:   Days:   Peripheral IV 08/31/18 Left Antecubital   08/31/18    0150    Antecubital   less than 1  Intake/Output Last 24 hours No intake or output data in the 24 hours ending 08/31/18 0455  Labs/Imaging Results for orders placed or performed during the hospital encounter of 08/31/18 (from the past 48 hour(s))  CBC with Differential     Status: Abnormal   Collection Time: 08/31/18  1:34 AM  Result Value Ref Range   WBC 12.3 (H) 4.0 - 10.5 K/uL   RBC 2.88 (L) 4.22 - 5.81 MIL/uL   Hemoglobin 9.7 (L) 13.0 - 17.0 g/dL   HCT 31.5 (L) 39.0 - 52.0 %   MCV 109.4 (H) 80.0 - 100.0 fL   MCH 33.7 26.0 - 34.0 pg   MCHC 30.8 30.0 - 36.0 g/dL   RDW 15.0 11.5 - 15.5 %   Platelets 199 150 - 400 K/uL   nRBC 0.0 0.0 - 0.2 %   Neutrophils Relative % 84 %   Neutro Abs 10.3 (H) 1.7 -  7.7 K/uL   Lymphocytes Relative 10 %   Lymphs Abs 1.2 0.7 - 4.0 K/uL   Monocytes Relative 4 %   Monocytes Absolute 0.5 0.1 - 1.0 K/uL   Eosinophils Relative 2 %   Eosinophils Absolute 0.2 0.0 - 0.5 K/uL   Basophils Relative 0 %   Basophils Absolute 0.1 0.0 - 0.1 K/uL   Immature Granulocytes 0 %   Abs Immature Granulocytes 0.05 0.00 - 0.07 K/uL    Comment: Performed at [facility], [place] [address] [place], [place] [address]  Basic metabolic panel     Status: Abnormal   Collection Time: 08/31/18  1:34 AM  Result Value Ref Range   Sodium 142 135 - 145 mmol/L   Potassium 3.9 3.5 - 5.1 mmol/L   Chloride 119 (H) 98 - 111 mmol/L   CO2 18 (L) 22 - 32 mmol/L   Glucose, Bld 104 (H) 70 - 99 mg/dL   BUN 28 (H) 8 - 23 mg/dL   Creatinine, Ser 1.73 (H) 0.61 - 1.24 mg/dL   Calcium 7.0 (L) 8.9 - 10.3 mg/dL   GFR calc non Af Amer 38 (L) >60 mL/min   GFR calc Af Amer 44 (L) >60 mL/min   Anion gap 5 5 - 15    Comment: Performed at [facility], [place] [address] [place], [place] [address]  Brain natriuretic peptide     Status: Abnormal   Collection Time: 08/31/18  1:34 AM  Result Value Ref Range   B Natriuretic Peptide 1,257.9 (H) 0.0 - 100.0 pg/mL    Comment: Performed at [facility], [place] [address], [place], [place] [address]  Troponin I (High Sensitivity)     Status: Abnormal   Collection Time: 08/31/18  1:34 AM  Result Value Ref Range   Troponin I (High Sensitivity) 69 (H) <18 ng/L    Comment: (NOTE) Elevated high sensitivity troponin I (hsTnI) values and significant  changes across serial measurements may suggest ACS but many other  chronic and acute conditions are known to elevate hsTnI results.  Refer to the "Links" section for chest pain algorithms and additional  guidance. Performed at [facility], [place] [address], [place], [place] [address]   SARS Coronavirus 2 (CEPHEID- Performed in [place] hospital  lab), Hosp Order     Status: None   Collection Time: 08/31/18  1:34 AM   Specimen: Nasopharyngeal Swab  Result Value Ref Range   SARS Coronavirus 2 NEGATIVE NEGATIVE    Comment: (NOTE) If result is NEGATIVE SARS-CoV-2 target  nucleic acids are NOT DETECTED. The SARS-CoV-2 RNA is generally detectable in upper and lower  respiratory specimens during the acute phase of infection. The lowest  concentration of SARS-CoV-2 viral copies this assay can detect is 250  copies / mL. A negative result does not preclude SARS-CoV-2 infection  and should not be used as the sole basis for treatment or other  patient management decisions.  A negative result may occur with  improper specimen collection / handling, submission of specimen other  than nasopharyngeal swab, presence of viral mutation(s) within the  areas targeted by this assay, and inadequate number of viral copies  (<250 copies / mL). A negative result must be combined with clinical  observations, patient history, and epidemiological information. If result is POSITIVE SARS-CoV-2 target nucleic acids are DETECTED. The SARS-CoV-2 RNA is generally detectable in upper and lower  respiratory specimens dur ing the acute phase of infection.  Positive  results are indicative of active infection with SARS-CoV-2.  Clinical  correlation with patient history and other diagnostic information is  necessary to determine patient infection status.  Positive results do  not rule out bacterial infection or co-infection with other viruses. If result is PRESUMPTIVE POSTIVE SARS-CoV-2 nucleic acids MAY BE PRESENT.   A presumptive positive result was obtained on the submitted specimen  and confirmed on repeat testing.  While 2019 novel coronavirus  (SARS-CoV-2) nucleic acids may be present in the submitted sample  additional confirmatory testing may be necessary for epidemiological  and / or clinical management purposes  to differentiate between  SARS-CoV-2 and  other Sarbecovirus currently known to infect humans.  If clinically indicated additional testing with an alternate test  methodology [phone]) is advised. The SARS-CoV-2 RNA is generally  detectable in upper and lower respiratory sp ecimens during the acute  phase of infection. The expected result is Negative. Fact Sheet for Patients:  StrictlyIdeas.no Fact Sheet for Healthcare Providers: BankingDealers.co.za This test is not yet approved or cleared by the [place] FDA and has been authorized for detection and/or diagnosis of SARS-CoV-2 by FDA under an Emergency Use Authorization (EUA).  This EUA will remain in effect (meaning this test can be used) for the duration of the COVID-19 declaration under Section 564(b)(1) of the Act, 21 U.S.C. section 360bbb-3(b)(1), unless the authorization is terminated or revoked sooner. Performed at [facility], [place] [address], [place], [place] [address]    Dg Chest Port 1 View  Result Date: 08/31/2018 CLINICAL DATA:  Shortness of breath and wheezing EXAM: PORTABLE CHEST 1 VIEW COMPARISON:  Chest x-ray dated August 20, 2018 FINDINGS: The heart size is enlarged. There are new ground-glass airspace opacities bilaterally. There is no pneumothorax. No large pleural effusion. There is no acute osseous abnormality. IMPRESSION: Cardiomegaly with findings suspicious for developing pulmonary edema. An atypical infectious process can have a similar appearance in the appropriate clinical setting. Electronically Signed   By: Constance Holster M.D.   On: 08/31/2018 02:22    Pending Labs Unresulted Labs (From admission, onward)    Start     Ordered   08/31/18 0428  Troponin I (High Sensitivity)  STAT Now then every 2 hours,   R (with STAT occurrences)    Question:  Indication  Answer:  Suspect ACS   08/31/18 0428          Vitals/Pain Today's Vitals   08/31/18 0127 08/31/18 0324 08/31/18 0355   BP:  (!) 141/93 (!) 154/96  Pulse:  (!) 131 Marland Kitchen)  122  Resp:  (!) 26 (!) 25  Temp:   98.2 F (36.8 C)  TempSrc:   Oral  SpO2: 97% 95% 97%  PainSc: 0-No pain  0-No pain    Isolation Precautions Airborne and Contact precautions  Medications Medications  levalbuterol (XOPENEX HFA) inhaler 2 puff (has no administration in time range)  furosemide (LASIX) injection 40 mg (40 mg Intravenous Given 08/31/18 0406)    Mobility walks Low fall risk   Focused Assessments Pulmonary Assessment Handoff:  Lung sounds: L Breath Sounds: Inspiratory wheezes, Expiratory wheezes R Breath Sounds: Inspiratory wheezes, Expiratory wheezes O2 Device: Room Air        R Recommendations: See Admitting Provider Note  Report given to:   Additional Notes:

## 2018-08-31 NOTE — Care Management (Signed)
This is a no charge note  Pending admission per PA, [facility]  73 year old man with past medical history of hypertension, pulmonary fibrosis, sCHF with EF of 30-35%, iron deficiency anemia, Crohn's disease, CKD stage III, gout, depression, anxiety, bipolar, who presents with shortness of breath.  Patient was found to have elevated BNP 1257, chest x-ray showed cardiomegaly with pulmonary edema, clinically consistent with CHF exacerbation.  COVID-19 negative.  Stable renal function.  Patient is admitted to telemetry bed as inpatient.  40 mg Lasix was given in ED.    Please call manager of [facility] at [phone] when pt arrives to floor   Ivor Costa, MD  [facility]   If 7PM-7AM, please contact night-coverage [email or web address] Password [facility] 08/31/2018, 4:28 AM

## 2018-08-31 NOTE — Consult Note (Addendum)
Cardiology Consultation:   Patient ID: Brent [name] MRN: [id number]; DOB: [birth year]  Admit date: 08/31/2018 Date of Consult: 08/31/2018  Primary Care Provider: Gayland Curry, DO Primary Cardiologist: Peter Martinique, MD  Primary Electrophysiologist:  None  Advanced Heart Failure: Loralie Champagne, MD  Patient Profile:   Brent [name] is a 73 y.o. 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 Blaine Hamper.  History of Present Illness:   Brent [name] was hospitalized in 04/2018 with pneumonia requiring BiPap and antibiotics. He was found to have decreased EF 30-35%. Nuclear stress test was high risk due to low EF. Changes were not felt to be indicative of CAD and with CKD and no angina, cath was not pursued. His HF was thought to be NICM vs viral. Heart failure meds were optimized, limited by CKD. He was seen again in the ED 05/12/18 with shortness of breath. BNP was elevated so lasix 40 mg IV was given with improvement. He was discharged on lasix 20 mg BID and referred to Advanced HF clinic.   Echocardiogram done on 05/05/2018 showed moderate-severely reduced systolic function with EF 30-35%, moderate to severely dilated LV, mild concentric left ventricular hypertrophy, diastolic dysfunction and diffuse hypokinesis.  There was trivial aortic valve regurgitation.  Brent [name] was last seen by virtual visit on 08/06/18 by Darrick Grinder, NP at which time his volume status was felt to be stable. He was continued on lasix 20 mg MWF with as needed extra 20 mg. He was off hydralazine due to hypotension, and off spironolactone due to recent AKI in hospital and orthostasis. He continued on Imdur 30 mg, Toprol XL 25 mg. There was a plan to repeat his echo in August.   On my assessment Brent [name] states that he  was feeling OK yesterday. Last night he was awakened at about 12:15 being very short of breath, wheezing and sweating profusely. He denies having had any chest pain or pressure. He wnet out to sit up in his living room and tried using 2 vials with his nebulizer with no relief. He called EMS who gave his oxygen and another neb tx which really helped. He states that he has had DOE and orthopnea with PND ever since his pneumonia in March. He feels that he has some pedal edema as he can't see his veins like usual, although I do not see any.   Brent [name] is trying to stay active, walking around the block every day. He has to stop about 3 times to catch his breath. He did eat a high salt meal yesterday of chicken gizzards at [employer] known for [employer]. He says his usual home wt is 160 lbs. His wt yesterday was 153 lbs. He says his home BP is usually around 135/75. He has not noted any low BPs and not ever less than 100.   Brent [name] is currently feeling much better, back to normal. No longer wheezing. He denies any recent fever or cough.   Heart Pathway Score:     Past Medical History:  Diagnosis Date  . Anemia   . Anxiety   . Benign paroxysmal positional vertigo   . Bipolar I disorder, most recent episode (or current) unspecified   . Celiac disease   . Cellulitis and abscess of unspecified site   .  Chronic kidney disease, stage III (moderate) (HCC)   . Crohn's    Remicade q8 weeks  . Depression   . Dizziness and giddiness   . Essential and other specified forms of tremor    medication-induced Parkinson's, now resolved  . Essential hypertension, benign   . Gout 2018  . Hypertrophy of prostate without urinary obstruction and other lower urinary tract symptoms (LUTS)   . Hypopotassemia   . Impotence of organic origin   . Insomnia with sleep apnea, unspecified   . Iron deficiency anemia, unspecified   . Loss of weight   . Malnutrition of mild degree ([place])   . 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  Neuralgia, neuritis, and radiculitis, unspecified   . Other B-complex deficiencies   . Other extrapyramidal disease and abnormal movement disorder   . Postinflammatory pulmonary fibrosis ([place])   . Tobacco use disorder   . Vertigo 2018  . Viral warts, unspecified     Past Surgical History:  Procedure Laterality Date  . CHOLECYSTECTOMY  07-12-2010  . SMALL INTESTINE SURGERY     x 2     Home Medications:  Prior to Admission medications   Medication Sig Start Date End Date Taking? Authorizing Provider  allopurinol (ZYLOPRIM) 100 MG tablet Take 2 tablets (200 mg total) by mouth daily. Patient taking differently: Take 100 mg by mouth daily.  04/10/16  Yes Lauree Chandler, NP  cholecalciferol (VITAMIN D) 1000 units tablet Take 1,000 Units by mouth daily.    Yes [provider]  divalproex (DEPAKOTE) 500 MG EC tablet Take 1,500 mg by mouth at bedtime.    Yes [provider]  epoetin alfa (PROCRIT) 95093 UNIT/ML injection Inject 10,000 Units into the skin every 30 (thirty) days.    Yes [provider]  ferrous sulfate 325 (65 FE) MG tablet Take 325 mg by mouth every Monday, Wednesday, and Friday.    Yes [provider]  furosemide (LASIX) 20 MG tablet Take 1 tablet (20 mg total) by mouth 3 (three) times a week. Monday Wednesday and Friday 07/09/18  Yes Georgiana Shore, NP  inFLIXimab (REMICADE) 100 MG injection Infuse Remicade IV schedule 1 81m/kg every 8 weeks Premedicate with Tylenol 500-6559mby mouth and Benadryl 25-5072my mouth prior to infusion. Last PPD was on 12/2009.  11/22/10  Yes JacMilus BanisterD  loperamide (IMODIUM) 2 MG capsule Take 4 mg by mouth daily as needed for diarrhea or loose stools.   Yes [provider]  megestrol (MEGACE) 40 MG/ML suspension Take 40 mLs by mouth daily. 08/05/18  Yes [provider]  metoprolol succinate (TOPROL-XL) 25 MG 24 hr tablet Take 25 mg by mouth every evening.    Yes [provider]  OLANZapine (ZYPREXA) 7.5 MG tablet Take 1 tablet (7.5 mg total) by mouth at bedtime. 01/03/16  Yes Reed, Tiffany L, DO  omeprazole (PRILOSEC) 20 MG capsule Take 20 mg by mouth daily before supper.     Yes [provider]  oxybutynin (DITROPAN-XL) 10 MG 24 hr tablet Take 10 mg by mouth at bedtime.   Yes [provider]  polyethylene glycol (MIRALAX / GLYCOLAX) packet Take 17 g by mouth daily as needed for mild constipation. 05/10/18  Yes SheKerney ElbeO    Inpatient Medications: Scheduled Meds:  Continuous Infusions:  PRN Meds: levalbuterol  Allergies:    Allergies  Allergen Reactions  . Azathioprine Other (See Comments)    REACTION: affected WBC  . Ciprofloxacin Other (  See Comments)    Unknown rxn  . Levaquin [Levofloxacin In D5w] Other (See Comments)    Unknown rxn  . Plendil [Felodipine] Other (See Comments)    Unknown rxn    Social History:   Social History   Socioeconomic History  . Marital status: Divorced    Spouse name: Not on file  . Number of children: 1  . Years of education: Not on file  . Highest education level: Not on file  Occupational History  . Occupation: retired  . Occupation: [employer]  . Financial resource strain: Not hard at all  . Food insecurity    Worry: Never true    Inability: Never true  . Transportation needs    Medical: No    Non-medical: No  Tobacco Use  . Smoking status: Former Smoker    Packs/day: 1.00    Years: 49.00    Pack years: 49.00    Types: Cigarettes    Start date: 04/11/1956    Quit date: 04/17/2014    Years since quitting: 4.3  . Smokeless tobacco: Never Used  Substance and Sexual Activity  . Alcohol use: No    Alcohol/week: 0.0 standard drinks  . Drug use: No  . Sexual activity: Never  Lifestyle  . Physical activity    Days per week: 0 days    Minutes per session: 0 min  . Stress: Not at all  Relationships  . Social connections    Talks on phone: More than three  times a week    Gets together: Three times a week    Attends religious service: More than 4 times per year    Active member of club or organization: Yes    Attends meetings of clubs or organizations: 1 to 4 times per year    Relationship status: Divorced  . Intimate partner violence    Fear of current or ex partner: No    Emotionally abused: No    Physically abused: No    Forced sexual activity: No  Other Topics Concern  . Not on file  Social History Narrative  . Not on file    Family History:    Family History  Problem Relation Age of Onset  . Diabetes Mother        maternal grandmother  . Uterine cancer Mother   . Emphysema Father   . Pneumonia Maternal Grandmother   . Colon cancer Neg Hx      ROS:  Please see the history of present illness.   All other ROS reviewed and negative.     Physical Exam/Data:   Vitals:   08/31/18 0500 08/31/18 0530 08/31/18 0531 08/31/18 0558  BP: (!) 160/103 (!) 182/95 (!) 147/100 (!) 148/93  Pulse: (!) 124 (!) 121 (!) 117 (!) 118  Resp: 20 19 19 20   Temp:   98.2 F (36.8 C) 98.2 F (36.8 C)  TempSrc:   Oral Oral  SpO2: 97% 100% 98% 95%   No intake or output data in the 24 hours ending 08/31/18 0807 Last 3 Weights 08/20/2018 05/12/2018 05/11/2018  Weight (lbs) 165 lb 165 lb 1 oz 169 lb  Weight (kg) 74.844 kg 74.872 kg 76.658 kg     There is no height or weight on file to calculate BMI.  General:  Well nourished, well developed, in no acute distress HEENT: normal Lymph: no adenopathy Neck: no JVD Endocrine:  No thryomegaly Vascular: No carotid bruits; FA pulses 2+ bilaterally without bruits  Cardiac:  normal S1, S2; regular rhythm, tachycardia; no murmur  Lungs:  clear to auscultation bilaterally, no wheezing, rhonchi or rales  Abd: soft, nontender, no hepatomegaly  Ext: no edema Musculoskeletal:  No deformities, BUE and BLE strength normal and equal Skin: warm and dry  Neuro:  CNs 2-12 intact, no focal abnormalities noted  Psych:  Normal affect   EKG:  The EKG was personally reviewed and demonstrates: Sinus tachycardia, 123 bpm, LVH criteria, abnormal R wave progression, T wave inversions V5-6 -previously present, QTC 468 Telemetry:  Telemetry was personally reviewed and demonstrates:  ST 120  Relevant CV Studies:  Echocardiogram 05/05/2018 IMPRESSIONS  1. The left ventricle has moderate-severely reduced systolic function, with an ejection fraction of 30-35%. The cavity size was moderate to severely dilated. There is mild concentric left ventricular hypertrophy. Left ventricular diastolic Doppler  parameters are consistent with impaired relaxation. Left ventricular diffuse hypokinesis.  2. The right ventricle has normal systolic function. The cavity was normal. There is no increase in right ventricular wall thickness.  3. The aortic valve is tricuspid Aortic valve regurgitation is trivial by color flow Doppler.  4. The inferior vena cava was normal in size with <50% respiratory variability.  Myocardial perfusion imaging 05/10/2018 IMPRESSION: 1. No focal reversible ischemia. Patchy areas of fixed defect in the anteroseptal and inferolateral walls of the mid ventricle and apex which may represent areas of prior infarct/scarring. 2. Left ventricular dilatation with global hypokinesis. 3. Left ventricular ejection fraction 28% 4. Non invasive risk stratification*: High   Laboratory Data:  High Sensitivity Troponin:   Recent Labs  Lab 08/31/18 0134 08/31/18 0428 08/31/18 0645  TROPONINIHS 69* 92.0* 89.0*     Cardiac EnzymesNo results for input(s): TROPONINI in the last 168 hours. No results for input(s): TROPIPOC in the last 168 hours.  Chemistry Recent Labs  Lab 08/31/18 0134  NA 142  K 3.9  CL 119*  CO2 18*  GLUCOSE 104*  BUN 28*  CREATININE 1.73*  CALCIUM 7.0*  GFRNONAA 38*  GFRAA 44*  ANIONGAP 5    No results for input(s): PROT, ALBUMIN, AST, ALT, ALKPHOS, BILITOT in the last 168  hours. Hematology Recent Labs  Lab 08/31/18 0134  WBC 12.3*  RBC 2.88*  HGB 9.7*  HCT 31.5*  MCV 109.4*  MCH 33.7  MCHC 30.8  RDW 15.0  PLT 199   BNP Recent Labs  Lab 08/31/18 0134  BNP 1,257.9*    DDimer No results for input(s): DDIMER in the last 168 hours.   Radiology/Studies:  Dg Chest Port 1 View  Result Date: 08/31/2018 CLINICAL DATA:  Shortness of breath and wheezing EXAM: PORTABLE CHEST 1 VIEW COMPARISON:  Chest x-ray dated August 20, 2018 FINDINGS: The heart size is enlarged. There are new ground-glass airspace opacities bilaterally. There is no pneumothorax. No large pleural effusion. There is no acute osseous abnormality. IMPRESSION: Cardiomegaly with findings suspicious for developing pulmonary edema. An atypical infectious process can have a similar appearance in the appropriate clinical setting. Electronically Signed   By: Constance Holster M.D.   On: 08/31/2018 02:22    Assessment and Plan:   1. Acute on chronic systolic and diastolic CHF -Newly diagnosed systolic heart failure in March of this year during an episode of pneumonia.  Currently followed in the advanced heart failure clinic.  Optimization of heart failure medications have been limited by CKD and hypotension. -Has continued to have DOE and orthopnea since March. Last night awakened with shortness of breath, wheezing and diaphoresis.  No chest pain. Not improved with home nebs but did improve with nebs by EMS. Pt noted high sodium meal yesterday.  -Per home wts, pt says wt actually down. No significant edema or JVD. Lungs with few crackles in left base.  -At home patient has been on Lasix 20 mg 3 times per week, M, W, F, Toprol-XL 25 mg in the evening. Not on ACE-I/ARB/spironolactone due to CKD and hypotension. Also now off hydralazine and Imdur.  -BNP 1258 -High-sensitivity troponin with initial increased from 69 to 92 then flat at 89 -Chest x-ray suspicious for developing pulmonary edema.  Atypical  infectious process could be considered. No recent fever or cough.  -Pt has been started on lasix 40 mg IV BID, has received 1 dose. Pt feels much better. -Wt today here 157 lbs. Pt reports normal home wt 160 lbs, was 153 yesterday. -Continue Toprol XL.  -Check TSH -Daily weights, strict I&O.   -continue diuresis today and monitor. Possibly switch to oral lasix tomorrow. Probably needs daily dosing at discharge instead of 3 times per week.   2. Acute on Chronic kidney disease stage III -Followed outpatient by Dr. Posey Pronto -Serum creatinine 1.73 (was up to 3.1 in April)  3. Hypertension -Patient has been documented to have hypotension and orthostasis.  Heart failure medications have been limited.  Currently his blood pressure is elevated 140s-160/90s- 100s. -Will have nursing check orthostatic VS.  -Pt reports home BPs ~135/75. He never sees low BP, definitively not lower than 100. Denies dizziness.  -Continue to monitor blood pressure with diuresis and can consider adding low-dose of Imdur with or without hydralazine.  4. Anemia of CKD -Hemoglobin 9.7.  Patient is on Procrit as an outpatient and iron supplement.      For questions or updates, please contact [place] Please consult [email or web address] for contact info under     Signed, Daune Perch, NP  08/31/2018 8:07 AM   History and all data above reviewed.  Patient examined.  I agree with the findings as above.  The patient has history of congestive heart failure as above.  He says he usually does fairly well.  He ambulates routinely at least a block for exercise.  He does have to stop for this.  However, his breathing has been stable.  He does not describe PND or orthopnea although he reports he uses his nebulizer a few times a day.  He ate a salty meal as above.  Last night he came in acutely short of breath.  He did respond to diuresis and he feels much better.  He is not having any chest pressure, neck or arm discomfort.  He is had  no new palpitations, presyncope or syncope.  His weights have been stable.  He does not think he has been having any edema.  He is on Megace to stimulate his appetite and has been eating well.  He takes his medications the titration of which has been limited by his hypotension.  The patient exam reveals COR:RRR  ,  Lungs: Clear  ,  Abd: Positive bowel sounds, no rebound no guarding, Ext No edema, Neck: Jugular venous distention at about 12 cm of 45 degrees.  All available labs, radiology testing, previous records reviewed. Agree with documented assessment and plan.  Acute systolic HF: By exam he still has some volume excess.  He is positive gallop.  I think he still needs IV diuresis tonight.  His blood pressure is elevated this admission and perhaps he will tolerate  some med titration.I am going to restart some low dose hydralazine and Imdur.  We might also be able to titrate his beta blocker before discharge.    Aadin Hochrein  12:00 PM  08/31/2018

## 2018-08-31 NOTE — ED Provider Notes (Signed)
[facility] DEPT Provider Note   CSN: [id number] Arrival date & time: 08/31/18  0115     History   Chief Complaint Chief Complaint  Patient presents with  . Shortness of Breath  . Asthma    HPI Brent [name] is a 73 y.o. male.     The history is provided by the patient and medical records.  Shortness of Breath Asthma Associated symptoms include shortness of breath.     73 y.o. M with hx of anxiety, anemia, bipolar disorder, celiac disease, CKD stage III, depression, HTN, gout, vertigo, narcolepsy, CHF with EF of 30-35%, presenting to the ED for SOB.  States he has felt extremely SOB all day today.  He has tried using his home albuterol inhaler all day today without relief.  States he tried not to come in but just was not getting any better.  EMS gave solu-medrol and magnesium which seems to have improved somewhat.  States he still feels somewhat SOB but not as bad.  He is on NRB currently with O2 sats 97%.  He denies any cough or fever.  Does not recall and sick or COVID exposures.  He has been taking his Lasix as directed, only takes on MWF.  Past Medical History:  Diagnosis Date  . Anemia   . Anxiety   . Benign paroxysmal positional vertigo   . Bipolar I disorder, most recent episode (or current) unspecified   . Celiac disease   . Cellulitis and abscess of unspecified site   . Chronic kidney disease, stage III (moderate) (HCC)   . Crohn's    Remicade q8 weeks  . Depression   . Dizziness and giddiness   . Essential and other specified forms of tremor    medication-induced Parkinson's, now resolved  . Essential hypertension, benign   . Gout 2018  . Hypertrophy of prostate without urinary obstruction and other lower urinary tract symptoms (LUTS)   . Hypopotassemia   . Impotence of organic origin   . Insomnia with sleep apnea, unspecified   . Iron deficiency anemia, unspecified   . Loss of weight   . Malnutrition of mild degree ([place])   .  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 Neuralgia, neuritis, and radiculitis, unspecified   . Other B-complex deficiencies   . Other extrapyramidal disease and abnormal movement disorder   . Postinflammatory pulmonary fibrosis ([place])   . Tobacco use disorder   . Vertigo 2018  . Viral warts, unspecified     Patient Active Problem List   Diagnosis Date Noted  . Chronic combined systolic and diastolic congestive heart failure ([place]) 06/03/2018  . Acute systolic CHF (congestive heart failure) ([place])   . Acute respiratory failure with hypoxia ([place]) 05/05/2018  . Multifocal pneumonia 05/05/2018  . Flash pulmonary edema ([place]) 05/05/2018  . Candida esophagitis ([place]) 09/22/2017  . History of smoking 30 or more pack years 01/05/2017  . Bipolar 1 disorder ([place])   . BPH (benign prostatic hyperplasia) 03/31/2013  . 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 Essential hypertension, benign 03/31/2013  . Chronic kidney disease (CKD), stage III (moderate) ([place]) 06/04/2012  . 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[place]) 01/23/2010    Past Surgical History:  Procedure Laterality Date  . CHOLECYSTECTOMY  07-12-2010  . SMALL INTESTINE SURGERY     x 2        Home Medications    Prior to Admission medications   Medication Sig Start Date End Date Taking? Authorizing Provider  allopurinol (ZYLOPRIM) 100  MG tablet Take 2 tablets (200 mg total) by mouth daily. 04/10/16   Lauree Chandler, NP  Calcium Carbonate-Vit D-Min 600-400 MG-UNIT TABS Take 1 tablet by mouth 3 (three) times daily.     [provider]  cholecalciferol (VITAMIN D) 1000 units tablet Take 1,000 Units by mouth daily.     [provider]  divalproex (DEPAKOTE) 500 MG EC tablet Take 1,500 mg by mouth at bedtime.     [provider]  epoetin alfa (PROCRIT) 75300 UNIT/ML injection Inject 10,000 Units into the skin every 30 (thirty) days.     [provider]  ferrous sulfate 325 (65 FE) MG tablet Take 325 mg by mouth 3 (three)  times a week.     [provider]  FLOMAX 0.4 MG CAPS capsule Take 1 capsule by mouth daily. 06/17/18   [provider]  furosemide (LASIX) 20 MG tablet Take 1 tablet (20 mg total) by mouth 3 (three) times a week. Monday Wednesday and Friday 07/09/18   Georgiana Shore, NP  inFLIXimab (REMICADE) 100 MG injection Infuse Remicade IV schedule 1 77m/kg every 8 weeks Premedicate with Tylenol 500-6565mby mouth and Benadryl 25-5060my mouth prior to infusion. Last PPD was on 12/2009.  11/22/10   JacMilus BanisterD  Loperamide HCl (LOPERAMIDE A-D PO) Take 1 capsule by mouth as needed (loose stools).     [provider]  magnesium oxide (MAG-OX) 400 (241.3 Mg) MG tablet Take 2,400 mg by mouth 3 (three) times daily.  04/17/17   [provider]  meclizine (ANTIVERT) 25 MG tablet Take 25 mg by mouth 3 (three) times daily as needed for dizziness.  06/21/16   [provider]  megestrol (MEGACE) 40 MG/ML suspension Take 40 mLs by mouth daily. 08/05/18   [provider]  metoprolol succinate (TOPROL-XL) 25 MG 24 hr tablet Take 25 mg by mouth daily.    [provider]  Multiple Vitamin (MULTIVITAMIN) tablet Take 1 tablet by mouth daily.      [provider]  OLANZapine (ZYPREXA) 7.5 MG tablet Take 1 tablet (7.5 mg total) by mouth at bedtime. 01/03/16   Reed, Tiffany L, DO  omeprazole (PRILOSEC) 20 MG capsule Take 20 mg by mouth daily before supper.      [provider]  oxybutynin (DITROPAN-XL) 10 MG 24 hr tablet Take 10 mg by mouth at bedtime.    [provider]  polyethylene glycol (MIRALAX / GLYCOLAX) packet Take 17 g by mouth daily as needed for mild constipation. 05/10/18   SheRaiford Nobletif, DO  vitamin E 400 UNIT capsule Take 400 Units by mouth daily.      [provider]    Family History Family History  Problem Relation Age of Onset  . Diabetes Mother        maternal grandmother  . Uterine cancer Mother    . Emphysema Father   . Pneumonia Maternal Grandmother   . Colon cancer Neg Hx     Social History Social History   Tobacco Use  . Smoking status: Former Smoker    Packs/day: 1.00    Years: 49.00    Pack years: 49.00    Types: Cigarettes    Start date: 04/11/1956    Quit date: 04/17/2014    Years since quitting: 4.3  . Smokeless tobacco: Never Used  Substance Use Topics  . Alcohol use: No    Alcohol/week: 0.0 standard drinks  . Drug use: No  Allergies   Azathioprine, Ciprofloxacin, Levaquin [levofloxacin in d5w], and Plendil [felodipine]   Review of Systems Review of Systems  Respiratory: Positive for shortness of breath.   All other systems reviewed and are negative.    Physical Exam Updated Vital Signs SpO2 97%   Physical Exam Vitals signs and nursing note reviewed.  Constitutional:      Appearance: He is well-developed.  HENT:     Head: Normocephalic and atraumatic.  Eyes:     Conjunctiva/sclera: Conjunctivae normal.     Pupils: Pupils are equal, round, and reactive to light.  Neck:     Musculoskeletal: Normal range of motion.  Cardiovascular:     Rate and Rhythm: Normal rate and regular rhythm.     Heart sounds: Normal heart sounds.  Pulmonary:     Effort: Pulmonary effort is normal.     Breath sounds: Wheezing present.     Comments: End expiratory wheeze but NAD, able to speak in sentences Abdominal:     General: Bowel sounds are normal.     Palpations: Abdomen is soft.  Musculoskeletal: Normal range of motion.  Skin:    General: Skin is warm and dry.  Neurological:     Mental Status: He is alert and oriented to person, place, and time.      ED Treatments / Results  Labs (all labs ordered are listed, but only abnormal results are displayed) Labs Reviewed  CBC WITH DIFFERENTIAL/PLATELET - Abnormal; Notable for the following components:      Result Value   WBC 12.3 (*)    RBC 2.88 (*)    Hemoglobin 9.7 (*)    HCT 31.5 (*)    MCV 109.4  (*)    Neutro Abs 10.3 (*)    All other components within normal limits  BASIC METABOLIC PANEL - Abnormal; Notable for the following components:   Chloride 119 (*)    CO2 18 (*)    Glucose, Bld 104 (*)    BUN 28 (*)    Creatinine, Ser 1.73 (*)    Calcium 7.0 (*)    GFR calc non Af Amer 38 (*)    GFR calc Af Amer 44 (*)    All other components within normal limits  BRAIN NATRIURETIC PEPTIDE - Abnormal; Notable for the following components:   B Natriuretic Peptide 1,257.9 (*)    All other components within normal limits  TROPONIN I (HIGH SENSITIVITY) - Abnormal; Notable for the following components:   Troponin I (High Sensitivity) 69 (*)    All other components within normal limits  SARS CORONAVIRUS 2 (HOSPITAL ORDER, [facility])    EKG EKG Interpretation  Date/Time:  Tuesday August 31 2018 02:34:20 EDT Ventricular Rate:  123 PR Interval:    QRS Duration: 89 QT Interval:  327 QTC Calculation: 468 R Axis:   15 Text Interpretation:  Sinus tachycardia Left ventricular hypertrophy Anterior infarct, old Abnormal T, consider ischemia, lateral leads No significant change since last tracing Confirmed by Pryor Curia [phone]) on 08/31/2018 2:40:08 AM   Radiology Dg Chest Port 1 View  Result Date: 08/31/2018 CLINICAL DATA:  Shortness of breath and wheezing EXAM: PORTABLE CHEST 1 VIEW COMPARISON:  Chest x-ray dated August 20, 2018 FINDINGS: The heart size is enlarged. There are new ground-glass airspace opacities bilaterally. There is no pneumothorax. No large pleural effusion. There is no acute osseous abnormality. IMPRESSION: Cardiomegaly with findings suspicious for developing pulmonary edema. An atypical infectious process can have a similar  appearance in the appropriate clinical setting. Electronically Signed   By: Constance Holster M.D.   On: 08/31/2018 02:22    Procedures Procedures (including critical care time)  Medications Ordered in ED Medications   levalbuterol (XOPENEX HFA) inhaler 2 puff (has no administration in time range)  furosemide (LASIX) injection 40 mg (40 mg Intravenous Given 08/31/18 0406)     Initial Impression / Assessment and Plan / ED Course  I have reviewed the triage vital signs and the nursing notes.  Pertinent labs & imaging results that were available during my care of the patient were reviewed by me and considered in my medical decision making (see chart for details).  73 year old male here with shortness of breath.  States this is been ongoing all day.  Tried using his home inhalers without much relief.  He denies any fever or cough.  He was given Solu-Medrol and magnesium by EMS with improvement but still feels somewhat short of breath.  He arrives here on nonrebreather and sats are 97%.  He does have some continued end expiratory wheezes but is in no apparent distress.   He denies any known sick contacts or known COVID exposures but given global pandemic, this is certainly possible.  Patient is on immunosuppressants (Remicade).  Will obtain screening labs including COVID-19 swab along with portable chest x-ray.  Will monitor.  Work-up as above, findings concerning for recurrent pulmonary edema.  Does report his Lasix was reduced a few months ago.  Troponin is denying, may represent some mild demand ischemia.  Patient without any active chest pain here.  Do not feel this represents ACS.  His COVID test is negative.  He was given 40 mg IV Lasix here.  Will admit for ongoing diuresis.  Discussed with Dr. Blaine Hamper-- he will admit for ongoing care.  Final Clinical Impressions(s) / ED Diagnoses   Final diagnoses:  Shortness of breath  Acute pulmonary edema [facility])    ED Discharge Orders    None       Larene Pickett, PA-C 08/31/18 [place], Delice Bison, DO 08/31/18 [phone]

## 2018-08-31 NOTE — Evaluation (Signed)
Physical Therapy Evaluation Patient Details Name: Brent [name] MRN: [id number] DOB: [birth year] Today's Date: 08/31/2018   History of Present Illness  73 yo male admitted with acute on chronic HF, dyspnea. Hx of gout, BPPV, bipolar d/o, CHF, Crohn's, CKD, anemia, neuropathy, pulm fibrosis  Clinical Impression  On eval, pt was Min guard assist for mobility. He walked ~75 feet in the hallway. HR up to 132 bpm, SaO2 100% on RA. Will continue to follow and progress activity as tolerated.     Follow Up Recommendations No PT follow up    Equipment Recommendations  None recommended by PT    Recommendations for Other Services       Precautions / Restrictions Precautions Precautions: Fall Restrictions Weight Bearing Restrictions: No      Mobility  Bed Mobility Overal bed mobility: Modified Independent                Transfers Overall transfer level: Needs assistance   Transfers: Sit to/from Stand Sit to Stand: Modified independent (Device/Increase time)            Ambulation/Gait Ambulation/Gait assistance: Min guard Gait Distance (Feet): 75 Feet Assistive device: None Gait Pattern/deviations: Staggering left;Staggering right;Drifts right/left     General Gait Details: mildly unsteady intermittently. close guard for safety. Pt denied dizziness/lightheadedness. HR up to 132 bpm  Stairs            Wheelchair Mobility    Modified Rankin (Stroke Patients Only)       Balance Overall balance assessment: Mild deficits observed, not formally tested                                           Pertinent Vitals/Pain Pain Assessment: No/denies pain    Home Living Family/patient expects to be discharged to:: Private residence Living Arrangements: Alone   Type of Home: Apartment Home Access: Stairs to enter Entrance Stairs-Rails: None Entrance Stairs-Number of Steps: 1 Home Layout: One level Home Equipment: Walker - 2 wheels;Cane - single  point      Prior Function Level of Independence: Independent with assistive device(s)               Hand Dominance        Extremity/Trunk Assessment   Upper Extremity Assessment Upper Extremity Assessment: Overall WFL for tasks assessed    Lower Extremity Assessment Lower Extremity Assessment: Generalized weakness    Cervical / Trunk Assessment Cervical / Trunk Assessment: Normal  Communication   Communication: No difficulties  Cognition Arousal/Alertness: Awake/alert Behavior During Therapy: WFL for tasks assessed/performed Overall Cognitive Status: Within Functional Limits for tasks assessed                                        General Comments      Exercises     Assessment/Plan    PT Assessment Patient needs continued PT services  PT Problem List Decreased mobility;Decreased balance;Decreased activity tolerance       PT Treatment Interventions DME instruction;Gait training;Therapeutic exercise;Therapeutic activities;Patient/family education;Functional mobility training;Balance training    PT Goals (Current goals can be found in the Care Plan section)  Acute Rehab PT Goals Patient Stated Goal: regain PLOF PT Goal Formulation: With patient Time For Goal Achievement: 09/14/18 Potential to Achieve Goals: Good    Frequency  Min 3X/week   Barriers to discharge        Co-evaluation               AM-PAC PT "6 Clicks" Mobility  Outcome Measure Help needed turning from your back to your side while in a flat bed without using bedrails?: None Help needed moving from lying on your back to sitting on the side of a flat bed without using bedrails?: None Help needed moving to and from a bed to a chair (including a wheelchair)?: None Help needed standing up from a chair using your arms (e.g., wheelchair or bedside chair)?: None Help needed to walk in hospital room?: A Little Help needed climbing 3-5 steps with a railing? : A Little 6  Click Score: 22    End of Session Equipment Utilized During Treatment: Gait belt Activity Tolerance: Patient tolerated treatment well Patient left: in bed;with call bell/phone within reach   PT Visit Diagnosis: Unsteadiness on feet (R26.81)    Time: 1010-1018 PT Time Calculation (min) (ACUTE ONLY): 8 min   Charges:   PT Evaluation $PT Eval Moderate Complexity: [place], PT Acute Rehabilitation Services Pager: [phone] Office: [phone]

## 2018-08-31 NOTE — TOC Initial Note (Signed)
Transition of Care [facility]) - Initial/Assessment Note    Patient Details  Name: Brent [name] MRN: [id number] Date of Birth: 1945-04-15  [facility]) CM/SW Contact:    Dessa Phi, RN Phone Number: 08/31/2018, 2:10 PM  Clinical Narrative: From home alone, has support from sister [place]. Qualifies for CHF protocal-2 adm/64month, 20% readmit risk,CH[facility] [place] & following- CHF protocal program-HHRN heart failure..MD notified. No PT f/u needed.                 Expected Discharge Plan: [place]Barriers to Discharge: Continued Medical Work up   Patient Goals and CMS Choice Patient states their goals for this hospitalization and ongoing recovery are:: go home CMS Medicare.gov Compare Post Acute Care list provided to:: Patient Choice offered to / list presented to : Patient  Expected Discharge Plan and Services Expected Discharge Plan: [place]  Discharge Planning Services: CM Consult, HF Clinic Post Acute Care Choice: Durable Medical Equipment(rw) Living arrangements for the past 2 months: Single Family Home                                      Prior Living Arrangements/Services Living arrangements for the past 2 months: Single Family Home Lives with:: Self Patient language and need for interpreter reviewed:: Yes Do you feel safe going back to the place where you live?: Yes      Need for Family Participation in Patient Care: No (Comment) Care giver support system in place?: Yes (comment) Current home services: DME(rw) Criminal Activity/Legal Involvement Pertinent to Current Situation/Hospitalization: No - Comment as needed  Activities of Daily Living Home Assistive Devices/Equipment: Eyeglasses, Blood pressure cuff, Scales ADL Screening (condition at time of admission) Patient's cognitive ability adequate to safely complete daily activities?: Yes Is the patient deaf or have difficulty hearing?:  No Does the patient have difficulty seeing, even when wearing glasses/contacts?: No Does the patient have difficulty concentrating, remembering, or making decisions?: No Patient able to express need for assistance with ADLs?: Yes Does the patient have difficulty dressing or bathing?: Yes Independently performs ADLs?: Yes (appropriate for developmental age) Does the patient have difficulty walking or climbing stairs?: No Weakness of Legs: None Weakness of Arms/Hands: None  Permission Sought/Granted Permission sought to share information with : Case Manager Permission granted to share information with : Yes, Verbal Permission Granted  Share Information with NAME: RAssunta Curtis Permission granted to share info w AGENCY: [place]anted to share info w Relationship: Sister  Permission granted to share info w Contact Information: [phone] Emotional Assessment Appearance:: Appears stated age   Affect (typically observed): Accepting Orientation: : Oriented to Self, Oriented to Place, Oriented to  Time, Oriented to Situation Alcohol / Substance Use: Tobacco Use Psych Involvement: No (comment)  Admission diagnosis:  Shortness of breath [R06.02] Acute pulmonary edema (HCC) [J81.0] Patient Active Problem List   Diagnosis Date Noted  . Acute on chronic systolic (congestive) heart failure ([place] 08/31/2018  . Acute on chronic combined systolic (congestive) and diastolic (congestive) heart failure ([place] 08/31/2018  . Chronic combined systolic and diastolic congestive heart failure ([place] 06/03/2018  . Acute systolic CHF (congestive heart failure) ([place]   . Acute respiratory failure with hypoxia ([place] 05/05/2018  . Multifocal pneumonia 05/05/2018  . Flash pulmonary edema ([place] 05/05/2018  . Candida esophagitis (  [place]) 09/22/2017  . History of smoking 30 or more pack years 01/05/2017  . Bipolar 1 disorder ([place])   . BPH (benign prostatic hyperplasia) 03/31/2013  . Anemia in chronic  kidney disease 03/31/2013  . Essential hypertension, benign 03/31/2013  . Chronic kidney disease (CKD), stage III (moderate) ([place]) 06/04/2012  . 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[place]) 01/23/2010   PCP:  Gayland Curry, DO Pharmacy:   [id number] [place], [place] - [place] [address] [place] [address] Phone: [phone] Fax: [phone]  [facility] [phone] - [place], [place] - [address] [place] [address] Phone: [phone] Fax: [phone]     Social Determinants of Health (SDOH) Interventions    Readmission Risk Interventions No flowsheet data found.

## 2018-08-31 NOTE — ED Notes (Signed)
Provider notified of bp,

## 2018-08-31 NOTE — Plan of Care (Signed)
Pt not experiencing dyspnea at this time.

## 2018-08-31 NOTE — ED Triage Notes (Signed)
Pt comes to ed from home, pt has been sob all day using his home albuterol inhaler without relief. Called ems at 12:23:32 am  Ems arrived iv 18 LAC, 125 solumedrol, 2 grams mag drip administered. Pt self administered albuterol today 15 mg inhaler.  Pt walks and alert x4, v/s on arrival 162/107 hr 136, SpO2 97 15 liters non re breather,  Temp 98.7.

## 2018-09-01 ENCOUNTER — Inpatient Hospital Stay (HOSPITAL_COMMUNITY): Payer: Medicare Other

## 2018-09-01 DIAGNOSIS — D849 Immunodeficiency, unspecified: Secondary | ICD-10-CM

## 2018-09-01 DIAGNOSIS — D539 Nutritional anemia, unspecified: Secondary | ICD-10-CM

## 2018-09-01 DIAGNOSIS — N179 Acute kidney failure, unspecified: Secondary | ICD-10-CM

## 2018-09-01 DIAGNOSIS — D899 Disorder involving the immune mechanism, unspecified: Secondary | ICD-10-CM

## 2018-09-01 LAB — BASIC METABOLIC PANEL
Anion gap: 12 (ref 5–15)
BUN: 51 mg/dL — ABNORMAL HIGH (ref 8–23)
CO2: 15 mmol/L — ABNORMAL LOW (ref 22–32)
Calcium: 7.3 mg/dL — ABNORMAL LOW (ref 8.9–10.3)
Chloride: 111 mmol/L (ref 98–111)
Creatinine, Ser: 2.42 mg/dL — ABNORMAL HIGH (ref 0.61–1.24)
GFR calc Af Amer: 30 mL/min — ABNORMAL LOW (ref >60)
GFR calc non Af Amer: 26 mL/min — ABNORMAL LOW (ref >60)
Glucose, Bld: 100 mg/dL — ABNORMAL HIGH (ref 70–99)
Potassium: 4.5 mmol/L (ref 3.5–5.1)
Sodium: 138 mmol/L (ref 135–145)

## 2018-09-01 LAB — TSH: TSH: 0.379 u[IU]/mL (ref 0.350–4.500)

## 2018-09-01 LAB — MAGNESIUM: Magnesium: 1 mg/dL — ABNORMAL LOW (ref 1.7–2.4)

## 2018-09-01 NOTE — Plan of Care (Signed)
  Problem: Clinical Measurements: Goal: Cardiovascular complication will be avoided Outcome: Progressing   Problem: Education: Goal: Knowledge of General Education information will improve Description: Including pain rating scale, medication(s)/side effects and non-pharmacologic comfort measures Outcome: Adequate for Discharge   Problem: Health Behavior/Discharge Planning: Goal: Ability to manage health-related needs will improve Outcome: Adequate for Discharge   Problem: Clinical Measurements: Goal: Ability to maintain clinical measurements within normal limits will improve Outcome: Adequate for Discharge Goal: Will remain free from infection Outcome: Adequate for Discharge Goal: Diagnostic test results will improve Outcome: Adequate for Discharge Goal: Respiratory complications will improve Outcome: Adequate for Discharge   Problem: Activity: Goal: Risk for activity intolerance will decrease Outcome: Adequate for Discharge   Problem: Nutrition: Goal: Adequate nutrition will be maintained Outcome: Adequate for Discharge   Problem: Coping: Goal: Level of anxiety will decrease Outcome: Adequate for Discharge   Problem: Elimination: Goal: Will not experience complications related to bowel motility Outcome: Adequate for Discharge   Problem: Pain Managment: Goal: General experience of comfort will improve Outcome: Adequate for Discharge   Problem: Safety: Goal: Ability to remain free from injury will improve Outcome: Adequate for Discharge   Problem: Skin Integrity: Goal: Risk for impaired skin integrity will decrease Outcome: Adequate for Discharge

## 2018-09-01 NOTE — Progress Notes (Addendum)
Progress Note  Patient Name: Brent [name] Date of Encounter: 09/01/2018  Primary Cardiologist: Peter Martinique, MD   Subjective   Feeling much better, no chest pain and wanting to go home.    Inpatient Medications    Scheduled Meds:  allopurinol  100 mg Oral Daily   cholecalciferol  1,000 Units Oral Daily   divalproex  1,500 mg Oral QHS   ferrous sulfate  325 mg Oral Q M,W,F   furosemide  40 mg Intravenous BID   heparin  5,000 Units Subcutaneous Q8H   hydrALAZINE  10 mg Oral Q8H   isosorbide mononitrate  30 mg Oral Daily   megestrol  1,600 mg Oral Daily   metoprolol succinate  25 mg Oral QPM   OLANZapine  7.5 mg Oral QHS   oxybutynin  10 mg Oral QHS   pantoprazole  40 mg Oral Daily   sodium chloride flush  3 mL Intravenous Q12H   Continuous Infusions:  sodium chloride     PRN Meds: sodium chloride, acetaminophen, ALPRAZolam, ipratropium, levalbuterol, ondansetron (ZOFRAN) IV, polyethylene glycol, sodium chloride flush   Vital Signs    Vitals:   08/31/18 0558 08/31/18 1241 08/31/18 2121 09/01/18 0522  BP: (!) 148/93 132/88 (!) 127/95 (!) 122/93  Pulse: (!) 118 (!) 117 (!) 111 (!) 103  Resp: 20 16 19 18   Temp: 98.2 F (36.8 C) 98.5 F (36.9 C) 98.6 F (37 C) 98.4 F (36.9 C)  TempSrc: Oral Oral Oral Oral  SpO2: 95% 99% 99% 97%  Weight: 71.3 kg     Height: 6' 4"  (1.93 m)       Intake/Output Summary (Last 24 hours) at 09/01/2018 0801 Last data filed at 09/01/2018 0437 Gross per 24 hour  Intake 600 ml  Output 2105 ml  Net -1505 ml   Last 3 Weights 08/31/2018 08/20/2018 05/12/2018  Weight (lbs) 157 lb 3 oz 165 lb 165 lb 1 oz  Weight (kg) 71.3 kg 74.844 kg 74.872 kg      Telemetry    SR one short 5 beats of NSVT - Personally Reviewed  ECG    No new - Personally Reviewed  Physical Exam   GEN: No acute distress.   Neck: No JVD Cardiac: RRR, no murmurs, rubs, or gallops.  Respiratory: Clear to auscultation bilaterally. GI: Soft, nontender,  non-distended  MS: No edema; No deformity. Neuro:  Nonfocal  Psych: Normal affect   Labs    High Sensitivity Troponin:   Recent Labs  Lab 08/31/18 0134 08/31/18 0428 08/31/18 0645  TROPONINIHS 69* 92.0* 89.0*      Cardiac EnzymesNo results for input(s): TROPONINI in the last 168 hours. No results for input(s): TROPIPOC in the last 168 hours.   Chemistry Recent Labs  Lab 08/31/18 0134 09/01/18 0608  NA 142 138  K 3.9 4.5  CL 119* 111  CO2 18* 15*  GLUCOSE 104* 100*  BUN 28* 51*  CREATININE 1.73* 2.42*  CALCIUM 7.0* 7.3*  GFRNONAA 38* 26*  GFRAA 44* 30*  ANIONGAP 5 12     Hematology Recent Labs  Lab 08/31/18 0134  WBC 12.3*  RBC 2.88*  HGB 9.7*  HCT 31.5*  MCV 109.4*  MCH 33.7  MCHC 30.8  RDW 15.0  PLT 199    BNP Recent Labs  Lab 08/31/18 0134  BNP 1,257.9*     DDimer No results for input(s): DDIMER in the last 168 hours.   Radiology    Ct Chest Wo Contrast  Result  Date: 08/31/2018 CLINICAL DATA:  Shortness of breath. Volume overload with heart failure suspected EXAM: CT CHEST WITHOUT CONTRAST TECHNIQUE: Multidetector CT imaging of the chest was performed following the standard protocol without IV contrast. COMPARISON:  Chest x-ray from earlier today and chest CT 05/05/2018 FINDINGS: Cardiovascular: Generous heart size with trace pericardial fluid and/or thickening. Mild coronary calcification. Mediastinum/Nodes: Circumferential prominent esophageal wall thickness but stable and generalized. This thickening is less extensive than seen in 2018. Enlarged lower right periesophageal lymph node that is stable since 2018 at least, presumably reactive and related to the esophagus. Lungs/Pleura: Trace pleural fluid on the right more than left with mild atelectasis. Subpleural reticulation in the upper lobes is scarring when correlated with 2018 CT. Mild emphysema. There is no edema, consolidation, or pneumothorax. Upper Abdomen: Bilateral renal cystic densities.  There is cholecystectomy which accounts for prominent bile duct diameter. Musculoskeletal: Degenerative changes without acute finding. IMPRESSION: 1. Trace pleural effusions with atelectasis.  No pulmonary edema. 2. Chronic generalized esophageal wall thickening with mildly enlarged periesophageal lymph node, please correlate for esophagitis symptoms. Electronically Signed   By: Monte Fantasia M.D.   On: 08/31/2018 13:35   Dg Chest Port 1 View  Result Date: 08/31/2018 CLINICAL DATA:  Shortness of breath and wheezing EXAM: PORTABLE CHEST 1 VIEW COMPARISON:  Chest x-ray dated August 20, 2018 FINDINGS: The heart size is enlarged. There are new ground-glass airspace opacities bilaterally. There is no pneumothorax. No large pleural effusion. There is no acute osseous abnormality. IMPRESSION: Cardiomegaly with findings suspicious for developing pulmonary edema. An atypical infectious process can have a similar appearance in the appropriate clinical setting. Electronically Signed   By: Constance Holster M.D.   On: 08/31/2018 02:22    Cardiac Studies   Echo scheduled for 09/29/18  Echo 05/05/18 IMPRESSIONS    1. The left ventricle has moderate-severely reduced systolic function, with an ejection fraction of 30-35%. The cavity size was moderate to severely dilated. There is mild concentric left ventricular hypertrophy. Left ventricular diastolic Doppler  parameters are consistent with impaired relaxation. Left ventricular diffuse hypokinesis.  2. The right ventricle has normal systolic function. The cavity was normal. There is no increase in right ventricular wall thickness.  3. The aortic valve is tricuspid Aortic valve regurgitation is trivial by color flow Doppler.  4. The inferior vena cava was normal in size with <50% respiratory variability.  FINDINGS  Left Ventricle: The left ventricle has moderate-severely reduced systolic function, with an ejection fraction of 30-35%. The cavity size was  moderate to severely dilated. There is mild concentric left ventricular hypertrophy. Left ventricular diastolic  Doppler parameters are consistent with impaired relaxation. Left ventricular diffuse hypokinesis. Right Ventricle: The right ventricle has normal systolic function. The cavity was normal. There is no increase in right ventricular wall thickness. Left Atrium: left atrial size was normal in size Right Atrium: right atrial size was normal in size. Right atrial pressure is estimated at 8 mmHg. Interatrial Septum: No atrial level shunt detected by color flow Doppler. Pericardium: There is no evidence of pericardial effusion. Mitral Valve: The mitral valve is normal in structure. Mitral valve regurgitation is mild by color flow Doppler. Tricuspid Valve: The tricuspid valve is normal in structure. Tricuspid valve regurgitation is trivial by color flow Doppler. Aortic Valve: The aortic valve is tricuspid Aortic valve regurgitation is trivial by color flow Doppler. There is no evidence of aortic valve stenosis. Pulmonic Valve: The pulmonic valve was grossly normal. Pulmonic valve regurgitation is not  visualized by color flow Doppler. Pulmonary Artery: The pulmonary artery is of normal size and origin. Venous: The inferior vena cava is normal in size with less than 50% respiratory variability.   LEFT VENTRICLE PLAX 2D LVIDd:         6.30 cm       Diastology LVIDs:         5.40 cm       LV e' lateral:   6.53 cm/s LV PW:         1.20 cm       LV E/e' lateral: 14.7 LV IVS:        1.30 cm       LV e' medial:    7.94 cm/s LVOT diam:     2.50 cm       LV E/e' medial:  12.1 LV SV:         60 ml LV SV Index:   30.32 LVOT Area:     4.91 cm   LV Volumes (MOD) LV area d, A4C:    50.40 cm LV area s, A4C:    38.10 cm LV major d, A4C:   8.95 cm LV major s, A4C:   8.45 cm LV vol d, MOD A4C: 234.0 ml LV vol s, MOD A4C: 142.0 ml LV SV MOD A4C:     234.0 ml  RIGHT VENTRICLE RV Basal diam:  3.30  cm RV S prime:     19.60 cm/s TAPSE (M-mode): 2.8 cm RVSP:           21.5 mmHg  LEFT ATRIUM             Index       RIGHT ATRIUM           Index LA diam:        3.90 cm 1.92 cm/m  RA Pressure: 8 mmHg LA Vol (A2C):   37.4 ml 18.44 ml/m RA Area:     14.60 cm LA Vol (A4C):   54.9 ml 27.06 ml/m RA Volume:   34.30 ml  16.91 ml/m LA Biplane Vol: 49.5 ml 24.40 ml/m  AORTIC VALVE LVOT Vmax:   102.00 cm/s LVOT Vmean:  70.200 cm/s LVOT VTI:    0.189 m AR PHT:      599 msec   AORTA Ao Root diam: 3.80 cm  MITRAL VALVE               TRICUSPID VALVE MV Area (PHT): 22.31 cm   TR Peak grad:   13.5 mmHg MV PHT:        9.86 msec   TR Vmax:        218.00 cm/s MV Decel Time: 34 msec     RVSP:           21.5 mmHg MV E velocity: 96.00 cm/s MV A velocity: 135.00 cm/s SHUNTS MV E/A ratio:  0.71        Systemic VTI:  0.19 m                            Systemic Diam: 2.50 cm   Patient Profile     73 y.o. 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    Assessment & Plan    Acute on chronic systolic & diastolic HF  with NICM (ischemic eval with nuc study)  --BNP 1257 --in March EF 30-35% plans for repeat echo in august after treatment.  --Neg 1505 since admit no wts to compare.   --on lasix 40 IV BID  change to po today,  As outpt was on 20 mg TID on MWF - has had orthostatic hypotension. . --on hydralazine 20 every 8 hours and imdur 30 mg daily had stopped at home due to hypotension,  also on Toprol 25 daily.  No ACE or ARB due to CKD  AKI on chronic, Cr today elevated from 1.73 to 2.42 (in March Cr 1.77 then climbed to 3.15 at pk) followed by renal  Spironolactone was stopped to to renal issues.    HTN controlled 127/95 to 122/93.    Elevated troponin HS.  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  Patient examined.  I agree with the findings as above.  No chest pain .  No SOB.  The patient exam reveals COR:RRR  ,  Lungs: Clear  ,  Abd: Positive bowel sounds, no rebound no guarding, Ext No edema  .  All available labs, radiology testing, previous records reviewed. Agree with documented assessment and plan. OK to go home from my standpoint.  Home on meds as on MAR except resume previous home MWF diuretic dosing.  Brent [name]  11:21 AM  09/01/2018

## 2018-09-01 NOTE — Evaluation (Signed)
Occupational Therapy Evaluation Patient Details Name: Brent [name] MRN: [id number] DOB: 30-Oct-1945 Today's Date: 09/01/2018    History of Present Illness 73 yo male admitted with acute on chronic HF, dyspnea. Hx of gout, BPPV, bipolar d/o, CHF, Crohn's, CKD, anemia, neuropathy, pulm fibrosis   Clinical Impression   Pt was admitted for the above.  Pt is mod I, at this time, extra time and effort.  Educated on energy conservation. No further OT is needed    Follow Up Recommendations  No OT follow up    Equipment Recommendations  None recommended by OT    Recommendations for Other Services       Precautions / Restrictions Precautions Precautions: Fall Restrictions Weight Bearing Restrictions: No      Mobility Bed Mobility               General bed mobility comments: oob  Transfers       Sit to Stand: Modified independent (Device/Increase time)              Balance                                           ADL either performed or assessed with clinical judgement   ADL Overall ADL's : Needs assistance/impaired                                       General ADL Comments: pt functioning at mod I level.  Educated on energy conservation.       Vision         Perception     Praxis      Pertinent Vitals/Pain Pain Assessment: No/denies pain     Hand Dominance     Extremity/Trunk Assessment             Communication Communication Communication: No difficulties   Cognition Arousal/Alertness: Awake/alert Behavior During Therapy: WFL for tasks assessed/performed Overall Cognitive Status: Within Functional Limits for tasks assessed                                     General Comments       Exercises     Shoulder Instructions      Home Living Family/patient expects to be discharged to:: Private residence Living Arrangements: Alone Available Help at Discharge: Friend(s);Available  PRN/intermittently Type of Home: Apartment             Bathroom Shower/Tub: Tub/shower unit   Bathroom Toilet: Handicapped height     Home Equipment: Environmental consultant - 2 wheels;Cane - single point;Shower seat          Prior Functioning/Environment Level of Independence: Independent with assistive device(s)                 OT Problem List:        OT Treatment/Interventions:      OT Goals(Current goals can be found in the care plan section) Acute Rehab OT Goals Patient Stated Goal: regain PLOF  OT Frequency:     Barriers to D/C:            Co-evaluation              AM-PAC OT "6 Clicks" Daily  Activity     Outcome Measure Help from another person eating meals?: None Help from another person taking care of personal grooming?: None Help from another person toileting, which includes using toliet, bedpan, or urinal?: None Help from another person bathing (including washing, rinsing, drying)?: None Help from another person to put on and taking off regular upper body clothing?: None Help from another person to put on and taking off regular lower body clothing?: None 6 Click Score: 24   End of Session    Activity Tolerance: Patient tolerated treatment well Patient left: in chair;with bed alarm set  OT Visit Diagnosis: Muscle weakness (generalized) (M62.81)                Time: 6578-4696 OT Time Calculation (min): 18 min Charges:  OT General Charges $OT Visit: 1 Visit OT Evaluation $OT Eval Low Complexity: [place], OTR/L Acute Rehabilitation Services [phone] [facility] pager [phone] office 09/01/2018  [place] 09/01/2018, 12:54 PM

## 2018-09-01 NOTE — Progress Notes (Signed)
PROGRESS NOTE  Brent [name] SWF:[id number] DOB: 09/13/1945 DOA: 08/31/2018 PCP: Gayland Curry, DO  Brief summary: Per ED triage note:  Pt comes to ed from home, pt has been sob all day using his home albuterol inhaler without relief. Called ems at 12:23:32 am  Due to dyspnea Ems arrived iv 18 LAC, 125 solumedrol, 2 grams mag drip administered. Pt self administered albuterol today 15 mg inhaler.  Pt walks and alert x4, v/s on arrival 162/107 hr 136, SpO2 97 15 liters non re breather,  Temp 98.7.    cxr with pulmonary edema, he received iv lasix, improved Cardiology consulted   HPI/Recap of past 24 hours:  Has a brief episode of SVT ( heart rate around 150-170) around lunch time, lasted about 10s He denies chest pain, no sob, no edema  Cr went up  He reports feeling better, ambulation without difficulty He wants to go home He reports lives byhim self He reports extra salt intake at home  Assessment/Plan: Principal Problem:   Acute on chronic combined systolic (congestive) and diastolic (congestive) heart failure (HCC) Active Problems:   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[place])   Chronic kidney disease (CKD), stage III (moderate) (HCC)   BPH (benign prostatic hyperplasia)   Essential hypertension, benign   Acute respiratory failure with hypoxia (HCC)   Chronic combined systolic and diastolic congestive heart failure (HCC)   Acute on chronic systolic (congestive) heart failure (HCC)   Acute Respiratory Failure with Hypoxia 2/2 to Acute on Chronic Combined Systolic and Diastolic CHF Exacerbation, improved -EMS started on NRB, currently he is on room air -SARS-CoV-2 testing was negative -last EF 30-35% in 04/2018, he denies chest pain, per cardiology likely NICM -improved on iv lasix -Cr went up, bp low normal, will hold off iv lasix, hold hydralazine -cardiology consulted, will follow recommendation  Nonsustained SVT: -tsh 0.37 -continue betablocker -keep k>4, mag>2  -cardiology following   AKI on CKD III -possibly due to iv lasix and hypotension -cr 1.73 on presentation, cr 2.4 today -hold lasix, repeat bmp in am  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 -Continue with ferrous sulfate 325 mg p.o. every Monday, Wednesday, Friday -hgb at baseline -depakote side effect? -b12 low normal, will start b12 supplement -continue outpatient follow up  Chronic generalized esophageal wall thickening with mildly enlarged periesophageal lymph node, Incidental findings on CT chest on presentatin He currently denies gi symptom , denies esophagitis symptoms He is on omeprazole qhs at home F/u with pmd and GI   Crohn's disease on Remicade every 8 weeks  Bipolar: Stable on xyprexa, depakote qhs  Code Status: DNR  Family Communication: patient   Disposition Plan: home in am if heart rate stable, cr improved, needs cardiology clearance  Home health RN for heart failure management  Consultants:  cardiology  Procedures:  none  Antibiotics:  none   Objective: BP 123/87 (BP Location: Left Arm)   Pulse 97   Temp 97.7 F (36.5 C) (Oral)   Resp 16   Ht 6' 4"  (1.93 m)   Wt 71.3 kg   SpO2 100%   BMI 19.13 kg/m   Intake/Output Summary (Last 24 hours) at 09/01/2018 1527 Last data filed at 09/01/2018 1522 Gross per 24 hour  Intake 843 ml  Output 2305 ml  Net -1462 ml   Filed Weights   08/31/18 0558  Weight: 71.3 kg    Exam: Patient is examined daily including today on 09/01/2018, exams remain the same as  of yesterday except that has changed    General:  NAD  Cardiovascular: RRR  Respiratory: CTABL  Abdomen: Soft/ND/NT, positive BS  Musculoskeletal: No Edema  Neuro: alert, oriented   Data Reviewed: Basic Metabolic Panel: Recent Labs  Lab 08/31/18 0134 09/01/18 0608  NA 142 138  K 3.9 4.5  CL 119* 111  CO2 18* 15*  GLUCOSE 104* 100*  BUN 28* 51*  CREATININE 1.73* 2.42*  CALCIUM 7.0*  7.3*  MG  --  1.0*   Liver Function Tests: No results for input(s): AST, ALT, ALKPHOS, BILITOT, PROT, ALBUMIN in the last 168 hours. No results for input(s): LIPASE, AMYLASE in the last 168 hours. No results for input(s): AMMONIA in the last 168 hours. CBC: Recent Labs  Lab 08/31/18 0134  WBC 12.3*  NEUTROABS 10.3*  HGB 9.7*  HCT 31.5*  MCV 109.4*  PLT 199   Cardiac Enzymes:   No results for input(s): CKTOTAL, CKMB, CKMBINDEX, TROPONINI in the last 168 hours. BNP (last 3 results) Recent Labs    05/05/18 0442 05/12/18 0629 08/31/18 0134  BNP 490.9* 1,145.7* 1,257.9*    ProBNP (last 3 results) No results for input(s): PROBNP in the last 8760 hours.  CBG: No results for input(s): GLUCAP in the last 168 hours.  Recent Results (from the past 240 hour(s))  SARS Coronavirus 2 (CEPHEID- Performed in [place] hospital lab), Hosp Order     Status: None   Collection Time: 08/31/18  1:34 AM   Specimen: Nasopharyngeal Swab  Result Value Ref Range Status   SARS Coronavirus 2 NEGATIVE NEGATIVE Final    Comment: (NOTE) If result is NEGATIVE SARS-CoV-2 target nucleic acids are NOT DETECTED. The SARS-CoV-2 RNA is generally detectable in upper and lower  respiratory specimens during the acute phase of infection. The lowest  concentration of SARS-CoV-2 viral copies this assay can detect is 250  copies / mL. A negative result does not preclude SARS-CoV-2 infection  and should not be used as the sole basis for treatment or other  patient management decisions.  A negative result may occur with  improper specimen collection / handling, submission of specimen other  than nasopharyngeal swab, presence of viral mutation(s) within the  areas targeted by this assay, and inadequate number of viral copies  (<250 copies / mL). A negative result must be combined with clinical  observations, patient history, and epidemiological information. If result is POSITIVE SARS-CoV-2 target nucleic  acids are DETECTED. The SARS-CoV-2 RNA is generally detectable in upper and lower  respiratory specimens dur ing the acute phase of infection.  Positive  results are indicative of active infection with SARS-CoV-2.  Clinical  correlation with patient history and other diagnostic information is  necessary to determine patient infection status.  Positive results do  not rule out bacterial infection or co-infection with other viruses. If result is PRESUMPTIVE POSTIVE SARS-CoV-2 nucleic acids MAY BE PRESENT.   A presumptive positive result was obtained on the submitted specimen  and confirmed on repeat testing.  While 2019 novel coronavirus  (SARS-CoV-2) nucleic acids may be present in the submitted sample  additional confirmatory testing may be necessary for epidemiological  and / or clinical management purposes  to differentiate between  SARS-CoV-2 and other Sarbecovirus currently known to infect humans.  If clinically indicated additional testing with an alternate test  methodology [id number]) is advised. The SARS-CoV-2 RNA is generally  detectable in upper and lower respiratory sp ecimens during the acute  phase of infection. The expected  result is Negative. Fact Sheet for Patients:  StrictlyIdeas.no Fact Sheet for Healthcare Providers: BankingDealers.co.za This test is not yet approved or cleared by the [place] FDA and has been authorized for detection and/or diagnosis of SARS-CoV-2 by FDA under an Emergency Use Authorization (EUA).  This EUA will remain in effect (meaning this test can be used) for the duration of the COVID-19 declaration under Section 564(b)(1) of the Act, 21 U.S.C. section 360bbb-3(b)(1), unless the authorization is terminated or revoked sooner. Performed at [facility], [place] [address] [place], [place] [address]      Studies: Dg Chest 2 View  Result Date: 09/01/2018 CLINICAL DATA:   Shortness of breath history of Crohn's disease EXAM: CHEST - 2 VIEW COMPARISON:  August 31, 2018 FINDINGS: The heart size and mediastinal contours are stable. Both lungs are clear. The visualized skeletal structures are unremarkable. IMPRESSION: No active cardiopulmonary disease. Electronically Signed   By: Abelardo Diesel M.D.   On: 09/01/2018 08:25    Scheduled Meds: . allopurinol  100 mg Oral Daily  . cholecalciferol  1,000 Units Oral Daily  . divalproex  1,500 mg Oral QHS  . ferrous sulfate  325 mg Oral Q M,W,F  . furosemide  40 mg Intravenous BID  . heparin  5,000 Units Subcutaneous Q8H  . hydrALAZINE  10 mg Oral Q8H  . isosorbide mononitrate  30 mg Oral Daily  . megestrol  1,600 mg Oral Daily  . metoprolol succinate  25 mg Oral QPM  . OLANZapine  7.5 mg Oral QHS  . oxybutynin  10 mg Oral QHS  . pantoprazole  40 mg Oral Daily  . sodium chloride flush  3 mL Intravenous Q12H    Continuous Infusions: . sodium chloride       Time spent: 55mns I have personally reviewed and interpreted on  09/01/2018 daily labs, tele strips, imagings as discussed above under date review session and assessment and plans.  I reviewed all nursing notes, pharmacy notes, consultant notes,  vitals, pertinent old records  I have discussed plan of care as described above with RN , patient  on 09/01/2018   FFlorencia ReasonsMD, PhD  [facility] Pager [phone] If 7PM-7AM, please contact night-coverage at [email or web address], password [email or web address]7/09/2018, 3:27 PM  LOS: 1 day

## 2018-09-01 NOTE — Plan of Care (Signed)
  Problem: Clinical Measurements: Goal: Cardiovascular complication will be avoided Outcome: Progressing   Problem: Education: Goal: Knowledge of General Education information will improve Description: Including pain rating scale, medication(s)/side effects and non-pharmacologic comfort measures Outcome: Adequate for Discharge   Problem: Health Behavior/Discharge Planning: Goal: Ability to manage health-related needs will improve Outcome: Adequate for Discharge   Problem: Clinical Measurements: Goal: Ability to maintain clinical measurements within normal limits will improve Outcome: Adequate for Discharge Goal: Will remain free from infection Outcome: Adequate for Discharge Goal: Diagnostic test results will improve Outcome: Adequate for Discharge Goal: Respiratory complications will improve Outcome: Adequate for Discharge   Problem: Activity: Goal: Risk for activity intolerance will decrease Outcome: Adequate for Discharge   Problem: Nutrition: Goal: Adequate nutrition will be maintained Outcome: Adequate for Discharge   Problem: Coping: Goal: Level of anxiety will decrease Outcome: Adequate for Discharge   Problem: Elimination: Goal: Will not experience complications related to bowel motility Outcome: Adequate for Discharge   Problem: Pain Managment: Goal: General experience of comfort will improve Outcome: Adequate for Discharge   Problem: Safety: Goal: Ability to remain free from injury will improve Outcome: Adequate for Discharge   Problem: Skin Integrity: Goal: Risk for impaired skin integrity will decrease Outcome: Adequate for Discharge   Problem: Education: Goal: Ability to demonstrate management of disease process will improve Outcome: Adequate for Discharge Goal: Ability to verbalize understanding of medication therapies will improve Outcome: Adequate for Discharge   Problem: Clinical Measurements: Goal: Ability to maintain clinical measurements  within normal limits will improve Outcome: Adequate for Discharge

## 2018-09-02 DIAGNOSIS — J810 Acute pulmonary edema: Secondary | ICD-10-CM

## 2018-09-02 LAB — CBC WITH DIFFERENTIAL/PLATELET
Abs Immature Granulocytes: 0.06 10*3/uL (ref 0.00–0.07)
Basophils Absolute: 0 10*3/uL (ref 0.0–0.1)
Basophils Relative: 0 %
Eosinophils Absolute: 0.2 10*3/uL (ref 0.0–0.5)
Eosinophils Relative: 2 %
HCT: 35.3 % — ABNORMAL LOW (ref 39.0–52.0)
Hemoglobin: 10.9 g/dL — ABNORMAL LOW (ref 13.0–17.0)
Immature Granulocytes: 1 %
Lymphocytes Relative: 31 %
Lymphs Abs: 3.4 10*3/uL (ref 0.7–4.0)
MCH: 32.8 pg (ref 26.0–34.0)
MCHC: 30.9 g/dL (ref 30.0–36.0)
MCV: 106.3 fL — ABNORMAL HIGH (ref 80.0–100.0)
Monocytes Absolute: 0.8 10*3/uL (ref 0.1–1.0)
Monocytes Relative: 7 %
Neutro Abs: 6.6 10*3/uL (ref 1.7–7.7)
Neutrophils Relative %: 59 %
Platelets: 247 10*3/uL (ref 150–400)
RBC: 3.32 MIL/uL — ABNORMAL LOW (ref 4.22–5.81)
RDW: 15.1 % (ref 11.5–15.5)
WBC: 11 10*3/uL — ABNORMAL HIGH (ref 4.0–10.5)
nRBC: 0 % (ref 0.0–0.2)

## 2018-09-02 LAB — BASIC METABOLIC PANEL
Anion gap: 14 (ref 5–15)
BUN: 67 mg/dL — ABNORMAL HIGH (ref 8–23)
CO2: 15 mmol/L — ABNORMAL LOW (ref 22–32)
Calcium: 7.6 mg/dL — ABNORMAL LOW (ref 8.9–10.3)
Chloride: 110 mmol/L (ref 98–111)
Creatinine, Ser: 2.46 mg/dL — ABNORMAL HIGH (ref 0.61–1.24)
GFR calc Af Amer: 29 mL/min — ABNORMAL LOW (ref >60)
GFR calc non Af Amer: 25 mL/min — ABNORMAL LOW (ref >60)
Glucose, Bld: 84 mg/dL (ref 70–99)
Potassium: 4 mmol/L (ref 3.5–5.1)
Sodium: 139 mmol/L (ref 135–145)

## 2018-09-02 LAB — HEPATIC FUNCTION PANEL
ALT: 20 U/L (ref 0–44)
AST: 21 U/L (ref 15–41)
Albumin: 3.6 g/dL (ref 3.5–5.0)
Alkaline Phosphatase: 70 U/L (ref 38–126)
Bilirubin, Direct: 0.1 mg/dL (ref 0.0–0.2)
Indirect Bilirubin: 0.1 mg/dL — ABNORMAL LOW (ref 0.3–0.9)
Total Bilirubin: 0.2 mg/dL — ABNORMAL LOW (ref 0.3–1.2)
Total Protein: 7.6 g/dL (ref 6.5–8.1)

## 2018-09-02 LAB — MAGNESIUM: Magnesium: 1 mg/dL — ABNORMAL LOW (ref 1.7–2.4)

## 2018-09-02 MED ORDER — METOPROLOL SUCCINATE ER 25 MG PO TB24: 12.5000 mg | ORAL_TABLET | Freq: Every day | ORAL | 0 refills | Status: DC

## 2018-09-02 MED ORDER — MAGNESIUM GLUCONATE 30 MG PO TABS: 30.0000 mg | ORAL_TABLET | ORAL | 0 refills | Status: DC

## 2018-09-02 MED ORDER — METOPROLOL SUCCINATE ER 25 MG PO TB24: 25.0000 mg | ORAL_TABLET | Freq: Every day | ORAL | 0 refills | Status: DC

## 2018-09-02 MED ORDER — MAGNESIUM SULFATE 2 GM/50ML IV SOLN
2.0000 g | Freq: Once | INTRAVENOUS | Status: AC
  Administered 2018-09-02: 2 g via INTRAVENOUS
  Filled 2018-09-02: qty 50

## 2018-09-02 MED ORDER — HYDRALAZINE HCL 10 MG PO TABS: 10.0000 mg | ORAL_TABLET | Freq: Two times a day (BID) | ORAL | 0 refills | Status: DC

## 2018-09-02 MED ORDER — ISOSORBIDE MONONITRATE ER 30 MG PO TB24: 30.0000 mg | ORAL_TABLET | Freq: Every day | ORAL | 0 refills | Status: DC

## 2018-09-02 NOTE — Consult Note (Addendum)
   [facility] Inpatient Consult   09/02/2018  Brent [name] 1945-11-04 [id number]  Patient is currently active with [facility] for chronic disease management services.  Patient has been engaged by a [facility] management team.  Our community based plan of care has focused on disease management and community resource support.  Patient with [facility] Medicare.  Patient will receive a post hospital call and will be evaluated for assessments and disease process education.    Plan: Follow up with [facility] community care management team [telephonically] for chronic disease management needs. Chart review reveals [facility].  1655: Patient had already transitioned home at this time.  Left the patient a HIPAA appropriate voicemail message requesting a return call back.  Of note, [facility] Care Management services does not replace or interfere with any services that are needed or arranged by inpatient [facility] care management team.  For additional questions or referrals please contact:  Natividad Brood, RN BSN [facility] Liaison  [phone] business mobile phone Toll free office [phone]  Fax number: [phone] [email or web address]    Natividad Brood, RN BSN [facility] Liaison  [phone] business mobile phone Toll free office [phone]  Fax number: [phone] [email or web address]

## 2018-09-02 NOTE — Progress Notes (Signed)
Pt had 8 beats V tach, BP 114/75, HR 111, Temp 98.8. MD notified. Will continue to monitor.

## 2018-09-02 NOTE — Discharge Summary (Addendum)
Discharge Summary  Brent [name] NFA:[id number] DOB: 1945/10/20  PCP: Gayland Curry, DO  Admit date: 08/31/2018 Discharge date: 09/02/2018  Time spent: 24mns, more than 50% time spent on coordination of care.  Recommendations for Outpatient Follow-up:  1. F/u with PCP within a week  for hospital discharge follow up, repeat cbc/bmp at follow up. 2. F/u with cardiology/heart failure clinic in one week, cardiology to decide on event monitor for tachycardia. 3. Home health RN for heart failure management, REds Vest protocol   Discharge Diagnoses:  Active Hospital Problems   Diagnosis Date Noted   Acute on chronic combined systolic (congestive) and diastolic (congestive) heart failure ([facility] 08/31/2018   AKI (acute kidney injury) ([facility]    Immunosuppressed status (HCC)    Macrocytic anemia    Acute on chronic systolic (congestive) heart failure ([facility] 08/31/2018   Chronic combined systolic and diastolic congestive heart failure ([facility] 06/03/2018   Acute respiratory failure with hypoxia ([facility] 05/05/2018   Acute pulmonary edema ([facility] 05/05/2018   Essential hypertension, benign 03/31/2013   BPH (benign prostatic hyperplasia) 03/31/2013   Chronic kidney disease (CKD), stage III (moderate) ([facility] 06/04/2012   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[facility] 01/23/2010    Resolved Hospital Problems  No resolved problems to display.    Discharge Condition: stable  Diet recommendation: heart healthy  Filed Weights   08/31/18 0558 09/02/18 0538  Weight: 71.3 kg 66.8 kg    History of present illness: (per admitting MD Dr SAlfredia Ferguson PCP: RGayland Curry DO   Patient coming from: Home  Chief Complaint: SOB, Diaphoresis   HPI: Brent [name] a 73y.o. male with medical history significant for but not limited to gout, BPPV, bipolar 1 disorder, celiac disease and Crohn's disease on Remicade every 8 weeks, CKD stage III, essential hypertension, iron deficiency anemia with Procrit injections,  chronic combined systolic and diastolic CHF, neuralgia, history of narcolepsy, history of postinflammatory pulmonary fibrosis, gout, depression, as well as other comorbidities who presented with a chief complaint of shortness of breath which acutely worsened last night associated with diaphoresis.  Patient states that he has been short of breath since March however he states it is gotten progressively worse and he had an inability to lay flat.  He also noticed that his legs were little bit more swollen specifically his feet.  Recently he was changed from p.o. Lasix every day to Monday Wednesday Friday by his nephrologist Dr. PPosey Pronto  Patient states that almost every night he becomes short of breath and he takes a breathing treatment through his nebulizer and improved however last night he took 2-3 without improvement and because he became so dyspneic he called EMS and was brought to the emergency room for further evaluation recommendation.  Denies any chest pain currently but did state that he had some diaphoresis.  No nausea or vomiting.  Feels better after getting Solu-Medrol and Lasix.  [facility] was asked to admit this patient for acute respiratory failure with hypoxia in the setting of acute on chronic combined systolic and diastolic CHF.  ED Course: In the ED the patient had basic blood work done as well as a chest x-ray and an [place] testing which was negative.  Patient was given a dose of IV Lasix as well as IV Solu-Medrol and was placed on 15 L nonrebreather and titrated off.  Blood work showed an elevated BNP and elevated troponin.  Patient was also given 2 puffs of Aleve albuterol inhaler.   Hospital Course:  Principal Problem:   Acute on chronic combined systolic (congestive) and diastolic (congestive) heart failure (HCC) Active Problems:   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HCC)   Chronic kidney disease (CKD), stage III (moderate) (HCC)   BPH (benign prostatic hyperplasia)   Essential  hypertension, benign   Acute respiratory failure with hypoxia (HCC)   Acute pulmonary edema (HCC)   Chronic combined systolic and diastolic congestive heart failure (HCC)   Acute on chronic systolic (congestive) heart failure (HCC)   AKI (acute kidney injury) (HCC)   Immunosuppressed status (HCC)   Macrocytic anemia   Acute Respiratory Failure with Hypoxia 2/2 to Acute on Chronic Combined Systolic and Diastolic CHF Exacerbation, improved -EMS started him on NRB due to significant dyspnea initially, dyspnea has resolved, he is on room air and ambulation without difficulty at discharge -SARS-CoV-2 testing was negative -last EF 30-35% in 04/2018, he denies chest pain, per cardiology 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 -improved on iv lasix 69m bid -Cr went up, bp low normal , per cardiology ok to discharge on low dose imdur, hydralazine, increase dose betablocker -he is discharged on home dose lasix 290mqMWF -he is to follow up with heart failure clinic in one week, repeat labs at follow up  Nonsustained SVT/NSVT: -tsh 0.37 -betablocker dose increased to 2527mam and 12.5mg60mm per cardiology recommendation -keep k>4, mag>2 -cardiology following   AKI on CKD III -possibly due to iv lasix and hypotension -cr 1.73 on presentation, cr 2.4 today -he is to close follow up with cardiology and repeat labs, further meds adjustment   Hypomagnesia: -replace mag  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 -Continue with ferrous sulfate 325 mg p.o. every Monday, Wednesday, Friday -hgb at baseline -depakote side effect? -b12 low normal, continue b12 supplement -continue outpatient follow up  Chronic generalized esophageal wall thickening with mildly enlarged periesophageal lymph node, Incidental findings on CT chest on presentatin He currently denies gi symptom , denies esophagitis symptoms He is on omeprazole qhs at home F/u with pmd and GI   Crohn's disease on Remicade  every 8 weeks  Bipolar: Stable on xyprexa, depakote qhs  Code Status: DNR  Family Communication: patient   Disposition Plan: home with cardiology clearance  Home health RN for heart failure management  Consultants:  cardiology  Procedures:  none  Antibiotics:  none   Discharge Exam: BP 114/75 (BP Location: Right Arm)    Pulse (!) 111    Temp 98.8 F (37.1 C) (Oral)    Resp 20    Ht 6' 4"  (1.93 m)    Wt 66.8 kg    SpO2 97%    BMI 17.93 kg/m   General: NAD Cardiovascular: RRR Respiratory: CTABL Extremity: no edema  Discharge Instructions You were cared for by a hospitalist during your hospital stay. If you have any questions about your discharge medications or the care you received while you were in the hospital after you are discharged, you can call the unit and asked to speak with the hospitalist on call if the hospitalist that took care of you is not available. Once you are discharged, your primary care physician will handle any further medical issues. Please note that NO REFILLS for any discharge medications will be authorized once you are discharged, as it is imperative that you return to your primary care physician (or establish a relationship with a primary care physician if you do not have one) for your aftercare needs so that they can reassess your need for medications  and monitor your lab values.  Discharge Instructions    Diet - low sodium heart healthy   Complete by: As directed    Face-to-face encounter (required for Medicare/Medicaid patients)   Complete by: As directed    I Florencia Reasons certify that this patient is under my care and that I, or a nurse practitioner or physician's assistant working with me, had a face-to-face encounter that meets the physician face-to-face encounter requirements with this patient on 09/02/2018. The encounter with the patient was in whole, or in part for the following medical condition(s) which is the primary reason for home  health care (List medical condition): FTT, heart failure management  REDS-Vest CHF protocal per Dr Missy Sabins   The encounter with the patient was in whole, or in part, for the following medical condition, which is the primary reason for home health care: FTT   I certify that, based on my findings, the following services are medically necessary home health services: Nursing   Reason for Medically Necessary Home Health Services: Skilled Nursing- Change/Decline in Patient Status   My clinical findings support the need for the above services: Shortness of breath with activity   Further, I certify that my clinical findings support that this patient is homebound due to: Shortness of Breath with activity   Heart failure home health orders   Complete by: As directed    Heart Failure Follow-up Care:  Verify follow-up appointments per Patient Discharge Instructions. Confirm transportation arranged. Reconcile home medications with discharge medication list. Remove discontinued medications from use. Assist patient/caregiver to manage medications using pill box. Reinforce low sodium food selection Assessments: Vital signs and oxygen saturation at each visit. Assess home environment for safety concerns, caregiver support and availability of low-sodium foods. Consult Education officer, museum, PT/OT, Dietitian, and CNA based on assessments. Perform comprehensive cardiopulmonary assessment. Notify MD for any change in condition or weight gain of 3 pounds in one day or 5 pounds in one week with symptoms. Daily Weights and Symptom Monitoring: Ensure patient has access to scales. Teach patient/caregiver to weigh daily before breakfast and after voiding using same scale and record.    Teach patient/caregiver to track weight and symptoms and when to notify Provider. Activity: Develop individualized activity plan with patient/caregiver.    Heart Failure Follow-up Care: Or per Doctor (see comments)   Obtain the following  labs: Basic Metabolic Panel   Lab frequency: Weekly   Fax lab results to: Other see comments   Diet: Low Sodium Heart Healthy   Fluid restrictions: 1500 mL Fluid   Skilled Nurse to notify MD of weight trends weekly for first 2 weeks. May fax or call: (call) OR fax to: Other see comments   Increase activity slowly   Complete by: As directed      Allergies as of 09/02/2018      Reactions   Azathioprine Other (See Comments)   REACTION: affected WBC   Ciprofloxacin Other (See Comments)   Unknown rxn   Levaquin [levofloxacin In D5w] Other (See Comments)   Unknown rxn   Plendil [felodipine] Other (See Comments)   Unknown rxn      Medication List    STOP taking these medications   oxybutynin 10 MG 24 hr tablet Commonly known as: DITROPAN-XL     TAKE these medications   allopurinol 100 MG tablet Commonly known as: ZYLOPRIM Take 2 tablets (200 mg total) by mouth daily. What changed: how much to take   cholecalciferol 1000 units tablet Commonly known as:  VITAMIN D Take 1,000 Units by mouth daily.   divalproex 500 MG DR tablet Commonly known as: DEPAKOTE Take 1,500 mg by mouth at bedtime.   ferrous sulfate 325 (65 FE) MG tablet Take 325 mg by mouth every Monday, Wednesday, and Friday.   furosemide 20 MG tablet Commonly known as: LASIX Take 1 tablet (20 mg total) by mouth 3 (three) times a week. Monday Wednesday and Friday   hydrALAZINE 10 MG tablet Commonly known as: APRESOLINE Take 1 tablet (10 mg total) by mouth 2 (two) times a day.   inFLIXimab 100 MG injection Commonly known as: Remicade Infuse Remicade IV schedule 1 20m/kg every 8 weeks Premedicate with Tylenol 500-6528mby mouth and Benadryl 25-5072my mouth prior to infusion. Last PPD was on 12/2009.   isosorbide mononitrate 30 MG 24 hr tablet Commonly known as: IMDUR Take 1 tablet (30 mg total) by mouth daily.   loperamide 2 MG capsule Commonly known as: IMODIUM Take 4 mg by mouth daily as needed for  diarrhea or loose stools.   magnesium gluconate 30 MG tablet Commonly known as: MAGONATE Take 1 tablet (30 mg total) by mouth every Monday, Wednesday, and Friday. Start taking on: September 03, 2018   megestrol 40 MG/ML suspension Commonly known as: MEGACE Take 40 mLs by mouth daily.   metoprolol succinate 25 MG 24 hr tablet Commonly known as: TOPROL-XL Take 1 tablet (25 mg total) by mouth daily. What changed: when to take this   metoprolol succinate 25 MG 24 hr tablet Commonly known as: Toprol XL Take 0.5 tablets (12.5 mg total) by mouth at bedtime. What changed: You were already taking a medication with the same name, and this prescription was added. Make sure you understand how and when to take each.   OLANZapine 7.5 MG tablet Commonly known as: ZyPREXA Take 1 tablet (7.5 mg total) by mouth at bedtime.   omeprazole 20 MG capsule Commonly known as: PRILOSEC Take 20 mg by mouth daily before supper.   polyethylene glycol 17 g packet Commonly known as: MIRALAX / GLYCOLAX Take 17 g by mouth daily as needed for mild constipation.   Procrit 10000 UNIT/ML injection Generic drug: epoetin alfa Inject 10,000 Units into the skin every 30 (thirty) days.            Durable Medical Equipment  (From admission, onward)         Start     Ordered   09/02/18 0000  Heart failure home health orders  (Heart failure home health orders / Face to face)    Comments: Heart Failure Follow-up Care:  Verify follow-up appointments per Patient Discharge Instructions. Confirm transportation arranged. Reconcile home medications with discharge medication list. Remove discontinued medications from use. Assist patient/caregiver to manage medications using pill box. Reinforce low sodium food selection Assessments: Vital signs and oxygen saturation at each visit. Assess home environment for safety concerns, caregiver support and availability of low-sodium foods. Consult SocEducation officer, museumT/OT, Dietitian,  and CNA based on assessments. Perform comprehensive cardiopulmonary assessment. Notify MD for any change in condition or weight gain of 3 pounds in one day or 5 pounds in one week with symptoms. Daily Weights and Symptom Monitoring: Ensure patient has access to scales. Teach patient/caregiver to weigh daily before breakfast and after voiding using same scale and record.    Teach patient/caregiver to track weight and symptoms and when to notify Provider. Activity: Develop individualized activity plan with patient/caregiver.   Question Answer Comment  Heart Failure Follow-up  Care Or per Doctor (see comments)   Obtain the following labs Basic Metabolic Panel   Lab frequency Weekly   Fax lab results to Other see comments   Diet Low Sodium Heart Healthy   Fluid restrictions: 1500 mL Fluid   Skilled Nurse to notify MD of weight trends weekly for first 2 weeks. May fax or call: (call) OR fax to: Other see comments      09/02/18 1208         Allergies  Allergen Reactions   Azathioprine Other (See Comments)    REACTION: affected WBC   Ciprofloxacin Other (See Comments)    Unknown rxn   Levaquin [Levofloxacin In D5w] Other (See Comments)    Unknown rxn   Plendil [Felodipine] Other (See Comments)    Unknown rxn   Follow-up Information    Reed, Tiffany L, DO Follow up in 1 week(s).   Specialty: Geriatric Medicine Why: hospital discharge follow up, repeat cbc/bmp at follow up Contact information: [address] [place] [address] [phone]        Larey Dresser, MD Follow up in 1 week(s).   Specialty: Cardiology Why: please call cardiology office to verify time of appointment Contact information: [place] [address] [phone]        [facility] Follow up.   Specialty: [place] Why: [facility] nursing-CHF protocal Contact information: [place] [address] [phone]            The results of  significant diagnostics from this hospitalization (including imaging, microbiology, ancillary and laboratory) are listed below for reference.    Significant Diagnostic Studies: Dg Chest 2 View  Result Date: 09/01/2018 CLINICAL DATA:  Shortness of breath history of Crohn's disease EXAM: CHEST - 2 VIEW COMPARISON:  August 31, 2018 FINDINGS: The heart size and mediastinal contours are stable. Both lungs are clear. The visualized skeletal structures are unremarkable. IMPRESSION: No active cardiopulmonary disease. Electronically Signed   By: Abelardo Diesel M.D.   On: 09/01/2018 08:25   Dg Chest 2 View  Result Date: 08/21/2018 CLINICAL DATA:  Shortness of breath and wheezing EXAM: CHEST - 2 VIEW COMPARISON:  May 12, 2018 FINDINGS: There is slight scarring in the right upper lobe. There is no evident edema or consolidation. Heart is upper normal in size with pulmonary vascularity normal. No adenopathy. There is degenerative change in the thoracic spine. IMPRESSION: Slight scarring right upper lobe. No edema or consolidation. Heart upper normal in size. Electronically Signed   By: Lowella Grip III M.D.   On: 08/21/2018 09:58   Ct Chest Wo Contrast  Result Date: 08/31/2018 CLINICAL DATA:  Shortness of breath. Volume overload with heart failure suspected EXAM: CT CHEST WITHOUT CONTRAST TECHNIQUE: Multidetector CT imaging of the chest was performed following the standard protocol without IV contrast. COMPARISON:  Chest x-ray from earlier today and chest CT 05/05/2018 FINDINGS: Cardiovascular: Generous heart size with trace pericardial fluid and/or thickening. Mild coronary calcification. Mediastinum/Nodes: Circumferential prominent esophageal wall thickness but stable and generalized. This thickening is less extensive than seen in 2018. Enlarged lower right periesophageal lymph node that is stable since 2018 at least, presumably reactive and related to the esophagus. Lungs/Pleura: Trace pleural fluid on the right  more than left with mild atelectasis. Subpleural reticulation in the upper lobes is scarring when correlated with 2018 CT. Mild emphysema. There is no edema, consolidation, or pneumothorax. Upper Abdomen: Bilateral renal cystic densities. There  is cholecystectomy which accounts for prominent bile duct diameter. Musculoskeletal: Degenerative changes without acute finding. IMPRESSION: 1. Trace pleural effusions with atelectasis.  No pulmonary edema. 2. Chronic generalized esophageal wall thickening with mildly enlarged periesophageal lymph node, please correlate for esophagitis symptoms. Electronically Signed   By: Monte Fantasia M.D.   On: 08/31/2018 13:35   Dg Chest Port 1 View  Result Date: 08/31/2018 CLINICAL DATA:  Shortness of breath and wheezing EXAM: PORTABLE CHEST 1 VIEW COMPARISON:  Chest x-ray dated August 20, 2018 FINDINGS: The heart size is enlarged. There are new ground-glass airspace opacities bilaterally. There is no pneumothorax. No large pleural effusion. There is no acute osseous abnormality. IMPRESSION: Cardiomegaly with findings suspicious for developing pulmonary edema. An atypical infectious process can have a similar appearance in the appropriate clinical setting. Electronically Signed   By: Constance Holster M.D.   On: 08/31/2018 02:22    Microbiology: Recent Results (from the past 240 hour(s))  SARS Coronavirus 2 (CEPHEID- Performed in [place] hospital lab), Hosp Order     Status: None   Collection Time: 08/31/18  1:34 AM   Specimen: Nasopharyngeal Swab  Result Value Ref Range Status   SARS Coronavirus 2 NEGATIVE NEGATIVE Final    Comment: (NOTE) If result is NEGATIVE SARS-CoV-2 target nucleic acids are NOT DETECTED. The SARS-CoV-2 RNA is generally detectable in upper and lower  respiratory specimens during the acute phase of infection. The lowest  concentration of SARS-CoV-2 viral copies this assay can detect is 250  copies / mL. A negative result does not preclude  SARS-CoV-2 infection  and should not be used as the sole basis for treatment or other  patient management decisions.  A negative result may occur with  improper specimen collection / handling, submission of specimen other  than nasopharyngeal swab, presence of viral mutation(s) within the  areas targeted by this assay, and inadequate number of viral copies  (<250 copies / mL). A negative result must be combined with clinical  observations, patient history, and epidemiological information. If result is POSITIVE SARS-CoV-2 target nucleic acids are DETECTED. The SARS-CoV-2 RNA is generally detectable in upper and lower  respiratory specimens dur ing the acute phase of infection.  Positive  results are indicative of active infection with SARS-CoV-2.  Clinical  correlation with patient history and other diagnostic information is  necessary to determine patient infection status.  Positive results do  not rule out bacterial infection or co-infection with other viruses. If result is PRESUMPTIVE POSTIVE SARS-CoV-2 nucleic acids MAY BE PRESENT.   A presumptive positive result was obtained on the submitted specimen  and confirmed on repeat testing.  While 2019 novel coronavirus  (SARS-CoV-2) nucleic acids may be present in the submitted sample  additional confirmatory testing may be necessary for epidemiological  and / or clinical management purposes  to differentiate between  SARS-CoV-2 and other Sarbecovirus currently known to infect humans.  If clinically indicated additional testing with an alternate test  methodology [id number]) is advised. The SARS-CoV-2 RNA is generally  detectable in upper and lower respiratory sp ecimens during the acute  phase of infection. The expected result is Negative. Fact Sheet for Patients:  StrictlyIdeas.no Fact Sheet for Healthcare Providers: BankingDealers.co.za This test is not yet approved or cleared by the  [place] FDA and has been authorized for detection and/or diagnosis of SARS-CoV-2 by FDA under an Emergency Use Authorization (EUA).  This EUA will remain in effect (meaning this test can be used) for the duration  of the COVID-19 declaration under Section 564(b)(1) of the Act, 21 U.S.C. section 360bbb-3(b)(1), unless the authorization is terminated or revoked sooner. Performed at [facility], [place] [address], [place], [place] [address]      Labs: Basic Metabolic Panel: Recent Labs  Lab 08/31/18 0134 09/01/18 0608 09/02/18 0514  NA 142 138 139  K 3.9 4.5 4.0  CL 119* 111 110  CO2 18* 15* 15*  GLUCOSE 104* 100* 84  BUN 28* 51* 67*  CREATININE 1.73* 2.42* 2.46*  CALCIUM 7.0* 7.3* 7.6*  MG  --  1.0* 1.0*   Liver Function Tests: Recent Labs  Lab 09/02/18 0514  AST 21  ALT 20  ALKPHOS 70  BILITOT 0.2*  PROT 7.6  ALBUMIN 3.6   No results for input(s): LIPASE, AMYLASE in the last 168 hours. No results for input(s): AMMONIA in the last 168 hours. CBC: Recent Labs  Lab 08/31/18 0134 09/02/18 0514  WBC 12.3* 11.0*  NEUTROABS 10.3* 6.6  HGB 9.7* 10.9*  HCT 31.5* 35.3*  MCV 109.4* 106.3*  PLT 199 247   Cardiac Enzymes: No results for input(s): CKTOTAL, CKMB, CKMBINDEX, TROPONINI in the last 168 hours. BNP: BNP (last 3 results) Recent Labs    05/05/18 0442 05/12/18 0629 08/31/18 0134  BNP 490.9* 1,145.7* 1,257.9*    ProBNP (last 3 results) No results for input(s): PROBNP in the last 8760 hours.  CBG: No results for input(s): GLUCAP in the last 168 hours.     Signed:  Florencia Reasons MD, PhD  [facility] 09/02/2018, 12:26 PM

## 2018-09-02 NOTE — Progress Notes (Addendum)
Progress Note  Patient Name: Brent [name] Date of Encounter: 09/02/2018  Primary Cardiologist: Peter Martinique, MD   Subjective   Was unaware of rapid HR no chest pain, breathing is stable.   Inpatient Medications    Scheduled Meds: . allopurinol  100 mg Oral Daily  . cholecalciferol  1,000 Units Oral Daily  . divalproex  1,500 mg Oral QHS  . ferrous sulfate  325 mg Oral Q M,W,F  . heparin  5,000 Units Subcutaneous Q8H  . isosorbide mononitrate  30 mg Oral Daily  . megestrol  1,600 mg Oral Daily  . metoprolol succinate  25 mg Oral QPM  . OLANZapine  7.5 mg Oral QHS  . oxybutynin  10 mg Oral QHS  . pantoprazole  40 mg Oral Daily  . sodium chloride flush  3 mL Intravenous Q12H   Continuous Infusions: . sodium chloride    . magnesium sulfate bolus IVPB     PRN Meds: sodium chloride, acetaminophen, ALPRAZolam, ipratropium, levalbuterol, ondansetron (ZOFRAN) IV, polyethylene glycol, sodium chloride flush   Vital Signs    Vitals:   09/01/18 1800 09/01/18 2031 09/02/18 0538 09/02/18 0818  BP: 111/66 93/71 106/80 114/75  Pulse:  97 (!) 102 (!) 111  Resp:  18 18 20   Temp:  97.6 F (36.4 C) 97.6 F (36.4 C) 98.8 F (37.1 C)  TempSrc:  Oral Oral Oral  SpO2:  98% 98% 97%  Weight:   66.8 kg   Height:        Intake/Output Summary (Last 24 hours) at 09/02/2018 1035 Last data filed at 09/02/2018 0306 Gross per 24 hour  Intake 600 ml  Output 1675 ml  Net -1075 ml   Last 3 Weights 09/02/2018 08/31/2018 08/20/2018  Weight (lbs) 147 lb 4.3 oz 157 lb 3 oz 165 lb  Weight (kg) 66.8 kg 71.3 kg 74.844 kg      Telemetry    SVT yesterday around 1226  HR 160 lasted around 9 sec - Personally Reviewed  ECG    No new - Personally Reviewed  Physical Exam   GEN: No acute distress.   Neck: No JVD sitting up  Cardiac: RRR, no murmurs, rubs, or gallops.  Respiratory: Clear to auscultation bilaterally. GI: Soft, nontender, non-distended  MS: No edema; No deformity. Neuro:  Nonfocal   Psych: Normal affect   Labs    High Sensitivity Troponin:   Recent Labs  Lab 08/31/18 0134 08/31/18 0428 08/31/18 0645  TROPONINIHS 69* 92.0* 89.0*      Cardiac EnzymesNo results for input(s): TROPONINI in the last 168 hours. No results for input(s): TROPIPOC in the last 168 hours.   Chemistry Recent Labs  Lab 08/31/18 0134 09/01/18 0608 09/02/18 0514  NA 142 138 139  K 3.9 4.5 4.0  CL 119* 111 110  CO2 18* 15* 15*  GLUCOSE 104* 100* 84  BUN 28* 51* 67*  CREATININE 1.73* 2.42* 2.46*  CALCIUM 7.0* 7.3* 7.6*  PROT  --   --  7.6  ALBUMIN  --   --  3.6  AST  --   --  21  ALT  --   --  20  ALKPHOS  --   --  70  BILITOT  --   --  0.2*  GFRNONAA 38* 26* 25*  GFRAA 44* 30* 29*  ANIONGAP 5 12 14      Hematology Recent Labs  Lab 08/31/18 0134 09/02/18 0514  WBC 12.3* 11.0*  RBC 2.88* 3.32*  HGB 9.7*  10.9*  HCT 31.5* 35.3*  MCV 109.4* 106.3*  MCH 33.7 32.8  MCHC 30.8 30.9  RDW 15.0 15.1  PLT 199 247    BNP Recent Labs  Lab 08/31/18 0134  BNP 1,257.9*     DDimer No results for input(s): DDIMER in the last 168 hours.   Radiology    Dg Chest 2 View  Result Date: 09/01/2018 CLINICAL DATA:  Shortness of breath history of Crohn's disease EXAM: CHEST - 2 VIEW COMPARISON:  August 31, 2018 FINDINGS: The heart size and mediastinal contours are stable. Both lungs are clear. The visualized skeletal structures are unremarkable. IMPRESSION: No active cardiopulmonary disease. Electronically Signed   By: Abelardo Diesel M.D.   On: 09/01/2018 08:25   Ct Chest Wo Contrast  Result Date: 08/31/2018 CLINICAL DATA:  Shortness of breath. Volume overload with heart failure suspected EXAM: CT CHEST WITHOUT CONTRAST TECHNIQUE: Multidetector CT imaging of the chest was performed following the standard protocol without IV contrast. COMPARISON:  Chest x-ray from earlier today and chest CT 05/05/2018 FINDINGS: Cardiovascular: Generous heart size with trace pericardial fluid and/or  thickening. Mild coronary calcification. Mediastinum/Nodes: Circumferential prominent esophageal wall thickness but stable and generalized. This thickening is less extensive than seen in 2018. Enlarged lower right periesophageal lymph node that is stable since 2018 at least, presumably reactive and related to the esophagus. Lungs/Pleura: Trace pleural fluid on the right more than left with mild atelectasis. Subpleural reticulation in the upper lobes is scarring when correlated with 2018 CT. Mild emphysema. There is no edema, consolidation, or pneumothorax. Upper Abdomen: Bilateral renal cystic densities. There is cholecystectomy which accounts for prominent bile duct diameter. Musculoskeletal: Degenerative changes without acute finding. IMPRESSION: 1. Trace pleural effusions with atelectasis.  No pulmonary edema. 2. Chronic generalized esophageal wall thickening with mildly enlarged periesophageal lymph node, please correlate for esophagitis symptoms. Electronically Signed   By: Monte Fantasia M.D.   On: 08/31/2018 13:35    Cardiac Studies    Echo 05/05/18 IMPRESSIONS   1. The left ventricle has moderate-severely reduced systolic function, with an ejection fraction of 30-35%. The cavity size was moderate to severely dilated. There is mild concentric left ventricular hypertrophy. Left ventricular diastolic Doppler  parameters are consistent with impaired relaxation. Left ventricular diffuse hypokinesis. 2. The right ventricle has normal systolic function. The cavity was normal. There is no increase in right ventricular wall thickness. 3. The aortic valve is tricuspid Aortic valve regurgitation is trivial by color flow Doppler. 4. The inferior vena cava was normal in size with <50% respiratory variability.  FINDINGS Left Ventricle: The left ventricle has moderate-severely reduced systolic function, with an ejection fraction of 30-35%. The cavity size was moderate to severely dilated. There is  mild concentric left ventricular hypertrophy. Left ventricular diastolic  Doppler parameters are consistent with impaired relaxation. Left ventricular diffuse hypokinesis. Right Ventricle: The right ventricle has normal systolic function. The cavity was normal. There is no increase in right ventricular wall thickness. Left Atrium: left atrial size was normal in size Right Atrium: right atrial size was normal in size. Right atrial pressure is estimated at 8 mmHg. Interatrial Septum: No atrial level shunt detected by color flow Doppler. Pericardium: There is no evidence of pericardial effusion. Mitral Valve: The mitral valve is normal in structure. Mitral valve regurgitation is mild by color flow Doppler. Tricuspid Valve: The tricuspid valve is normal in structure. Tricuspid valve regurgitation is trivial by color flow Doppler. Aortic Valve: The aortic valve is tricuspid Aortic valve  regurgitation is trivial by color flow Doppler. There is no evidence of aortic valve stenosis. Pulmonic Valve: The pulmonic valve was grossly normal. Pulmonic valve regurgitation is not visualized by color flow Doppler. Pulmonary Artery: The pulmonary artery is of normal size and origin. Venous: The inferior vena cava is normal in size with less than 50% respiratory variability.  LEFT VENTRICLE PLAX 2D LVIDd: 6.30 cm Diastology LVIDs: 5.40 cm LV e' lateral: 6.53 cm/s LV PW: 1.20 cm LV E/e' lateral: 14.7 LV IVS: 1.30 cm LV e' medial: 7.94 cm/s LVOT diam: 2.50 cm LV E/e' medial: 12.1 LV SV: 60 ml LV SV Index: 30.32 LVOT Area: 4.91 cm  LV Volumes (MOD) LV area d, A4C: 50.40 cm LV area s, A4C: 38.10 cm LV major d, A4C: 8.95 cm LV major s, A4C: 8.45 cm LV vol d, MOD A4C: 234.0 ml LV vol s, MOD A4C: 142.0 ml LV SV MOD A4C: 234.0 ml  RIGHT VENTRICLE RV Basal diam: 3.30 cm RV S prime: 19.60 cm/s TAPSE  (M-mode): 2.8 cm RVSP: 21.5 mmHg  LEFT ATRIUM Index RIGHT ATRIUM Index LA diam: 3.90 cm 1.92 cm/m RA Pressure: 8 mmHg LA Vol (A2C): 37.4 ml 18.44 ml/m RA Area: 14.60 cm LA Vol (A4C): 54.9 ml 27.06 ml/m RA Volume: 34.30 ml 16.91 ml/m LA Biplane Vol: 49.5 ml 24.40 ml/m AORTIC VALVE LVOT Vmax: 102.00 cm/s LVOT Vmean: 70.200 cm/s LVOT VTI: 0.189 m AR PHT: 599 msec  AORTA Ao Root diam: 3.80 cm  MITRAL VALVE TRICUSPID VALVE MV Area (PHT): 22.31 cm TR Peak grad: 13.5 mmHg MV PHT: 9.86 msec TR Vmax: 218.00 cm/s MV Decel Time: 34 msec RVSP: 21.5 mmHg MV E velocity: 96.00 cm/s MV A velocity: 135.00 cm/s SHUNTS MV E/A ratio: 0.71 Systemic VTI: 0.19 m Systemic Diam: 2.50 cm   Patient Profile     73 y.o. 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  Assessment & Plan    SVT yesterday at 1226 SVT with RVR rate of 160-170 - could this be playing role in acute HF vs due to be dry with rise in Cr.    --? Need to increase BB though not sure BP will tolerate.     NSVT more freq episodes.   Acute on chronic systolic & diastolic HF with NICM (ischemic eval with nuc study)  --BNP 1257 --in March EF 30-35% plans for repeat echo in august after treatment.  --Neg 2382 since admit and wt down to 66.8 Kg from 71.3 on admit.   --had been on lasix 40 IV BID  now held due to rising cr. Outpt was on 20 mg TID on MWF - has had orthostatic hypotension. . --on hydralazine 20 every 8 hours and imdur 30 mg daily had stopped at home due to hypotension,  also on Toprol 25 daily.  No ACE or ARB due to CKD --BP lower 106/80 to 114/75   AKI on chronic, Cr today  elevated from 1.73 to 2.46 (in March Cr 1.77 then climbed to 3.15 at pk) followed by renal  Spironolactone was stopped to to renal issues.  Lasix now stopped  HTN controlled but lower today 106/80 to 114/75   has hx of orthorstatic hypotension.  Hydralazine stopped   Elevated troponin HS.  pk of 92, most likely demand ischemia from HF - neg nuc in March 2020  Crohn's disease  per IM  Iron def anemia, related to CKD  On Iron receiving epogen     For questions or updates, please contact [facility] Please consult [email or web address] for contact info under   Signed, Cecilie Kicks, NP  09/02/2018, 10:35 AM     History and all data above reviewed.  Patient examined.  I agree with the findings as above. He feels well and is anxious to go home.   Creat increased.  Lasix is now stopped.  The patient exam reveals COR: RRR, tachy  ,  Lungs: Clear  ,  Abd: Positive bowel sounds, no rebound no guarding, Ext No edema  .  All available labs, radiology testing, previous records reviewed. Agree with documented assessment and plan. I would hold the diuretic until Monday and then resume at previous dose.  We schedule for TOC appt next week in the office with a BMET.   He does not notice the sinus tach or the Vtach.  I would suggest add 12.5 Toprol XL at night in addition to his AM dose.     Brent [name]  11:15 AM  09/02/2018

## 2018-09-03 ENCOUNTER — Telehealth: Payer: Self-pay | Admitting: Internal Medicine

## 2018-09-03 ENCOUNTER — Other Ambulatory Visit: Payer: Self-pay | Admitting: *Deleted

## 2018-09-03 NOTE — Patient Outreach (Signed)
[facility]) Care Management  09/03/2018  Brent [name] 07-15-1945 [id number]   Referral received 09/03/2018 Initial Outreach 09/03/2018 Hospital d/c 09/02/2018-Provider office to completed Transition of care Pt has also provider permission to speak with his sister when needed Brent [name])  RN spoke with the pt and verified identifiers. RN introduced [facility] services the purpose for today's call (pt receptive). Further discussed on pt's recent hospitalization related to HF symptoms. Pt not aware of what CHF. Therefore much education discussed on HF zones and the importance of daily monitoring of weights, adherence with medication especially the prescribed diuretics and adherence with all scheduled appointments for any pending changes with his providers. Discussed the HF zones and verified pt is in the GREEN zone with no precipitating symptoms. Will also review and offer printed Emmi material on HF (receptive). Will mail information out to pt along with the appropriate letters, education informaon and [facility] calendar for daily weights to be documented. Pt reports his received weight today on his home scales at 148.4 lbs and denies any swelling or related symptoms of HF exacerbation. Pt states he is taking all his medications and did not wish to review at this time when asked if provider's office reviewed. Pt is aware of the diuretic on his medication list for his HF.  Offered case management services based upon the above information as pt very receptive and a plan of care was generated with goals and interventions that were discussed in detail. Pt very appreciative and grateful for the call and all discussed today. Pt will clear understanding all the goals and what's expected on his behalf in managing his care. RN will again mail-out tools to assist pt with monitoring his HF. Will schedule another call next week for additional assessment needed for enrollment. No other issues or request at this  time as the plan of care is as followed:  [facility] CM Care Plan Problem One     Most Recent Value  Care Plan Problem One  Deficient Knowledge related to CHF unfamiliarity with information /education  Role Documenting the Problem One  Care Management [place] for Problem One  Active  [facility] Long Term Goal   Pt will verbalize two symptoms of CHF exacerbation and familiarize herself with CHF within the next 90 days.  [facility] Long Term Goal Start Date  09/03/18  Interventions for Problem One Long Term Goal  Will begin education on CBG and the importance of daily monitoring to prevent acute symptoms from occurring. Will send print emmi information and explained this information. Will stress the risk involved if pt's does not monitor and manage this medical condition. HF zones disussed in detail and what to do if acute. Will continue to reiterate on the zones and verified pt is in the GREEN zone today.  [facility] CM Short Term Goal #1   Pt will verbalize the plan of action in the yellow zone within the next 30 days.  [facility] CM Short Term Goal #1 Start Date  09/03/18  Interventions for Short Term Goal #1  Discussed the HF zones related to an action plan and stress the importance of early interventions to avoid acute problems from occuring.  [facility] CM Short Term Goal #2   Pt will adhere to all scheduled pending appointments within the next 30 days post -op hospitalization,  [facility] CM Short Term Goal #2 Start Date  09/03/18  Interventions for Short Term Goal #2  Will discuss the importance of attending all medical  appointments and verify pt has sufficient transportation to all scheduled appointments. Will review upcoming appointments noted via EPIC and any other outside provider appointments.      Some initial assessment taken however offered to scheduled another follow up call next week when more convenient for pt to obtain additional information. Pt also requested RN contact his sister [name]). RN attempted to  contact pt's sister however unsuccessful. RN left a HIPAA approvded voice message requesting a call back. Will update and speak with sister on the call back.  Raina Mina, RN Care Management Coordinator [place] Office [phone]

## 2018-09-03 NOTE — Telephone Encounter (Signed)
[place] with [facility] called.  Patient will start skilled nursing care on 09/07/18.

## 2018-09-03 NOTE — Telephone Encounter (Signed)
Spoke with patient. TOC scheduled.   Transition Care Management Follow-up Telephone Call  Date of discharge and from where: 09/02/2018 [place]  How have you been since you were released from the hospital? Patient stated a lot better  Any questions or concerns? No   Items Reviewed:  Did the pt receive and understand the discharge instructions provided? Yes   Medications obtained and verified? Yes   Any new allergies since your discharge? No   Dietary orders reviewed? Yes  Do you have support at home? Yes   Other (ie: DME, Home Health, etc) [facility]: (I = Independent and D = Dependent) ADL's: I  Bathing/Dressing- I   Meal Prep- I  Eating- I  Maintaining continence- I  Transferring/Ambulation- I  Managing Meds- I   Follow up appointments reviewed:    PCP Hospital f/u appt confirmed? Yes  Scheduled to see Dr. Mariea Clonts on 09/20/18   Specialist Hospital f/u appt confirmed? No     Are transportation arrangements needed? No   If their condition worsens, is the pt aware to call  their PCP or go to the ED? Yes  Was the patient provided with contact information for the PCP's office or ED? Yes  Was the pt encouraged to call back with questions or concerns? Yes

## 2018-09-03 NOTE — Telephone Encounter (Signed)
I'm happy to see him 7/27 but it will be more than 14 days from his discharge yesterday so it will not be a TOC visit.  Can one of the APPs see him sooner within the 14 day window or did he ask specifically to see me?

## 2018-09-03 NOTE — Telephone Encounter (Signed)
Great, he needs a TOC call and appt also if not already arranged.

## 2018-09-03 NOTE — Telephone Encounter (Signed)
He had already made the appointment with you before I had spoken with him but I will call him back and see if he will schedule with someone else sooner.    LMOM for patient to return call.

## 2018-09-06 ENCOUNTER — Encounter (HOSPITAL_COMMUNITY): Payer: Medicare Other

## 2018-09-07 ENCOUNTER — Telehealth: Payer: Self-pay | Admitting: *Deleted

## 2018-09-07 ENCOUNTER — Ambulatory Visit (INDEPENDENT_AMBULATORY_CARE_PROVIDER_SITE_OTHER): Payer: Medicare Other | Admitting: Family

## 2018-09-07 ENCOUNTER — Emergency Department (HOSPITAL_COMMUNITY): Payer: Medicare Other

## 2018-09-07 ENCOUNTER — Observation Stay (HOSPITAL_COMMUNITY)
Admission: EM | Admit: 2018-09-07 | Discharge: 2018-09-13 | Disposition: A | Discharge status: Home / Self Care | Payer: Medicare Other | Attending: Internal Medicine | Admitting: Internal Medicine

## 2018-09-07 ENCOUNTER — Other Ambulatory Visit: Payer: Self-pay

## 2018-09-07 ENCOUNTER — Encounter: Payer: Self-pay | Admitting: Family

## 2018-09-07 ENCOUNTER — Encounter (HOSPITAL_COMMUNITY): Payer: Self-pay | Admitting: Emergency Medicine

## 2018-09-07 ENCOUNTER — Ambulatory Visit (HOSPITAL_COMMUNITY)
Admission: RE | Admit: 2018-09-07 | Discharge: 2018-09-07 | Disposition: A | Discharge status: Home / Self Care | Payer: Medicare Other | Source: Ambulatory Visit | Attending: Nephrology | Admitting: Nephrology

## 2018-09-07 VITALS — BP 110/70 | HR 115 | Temp 98.9°F | Ht 76.0 in

## 2018-09-07 DIAGNOSIS — R531 Weakness: Secondary | ICD-10-CM

## 2018-09-07 DIAGNOSIS — D539 Nutritional anemia, unspecified: Secondary | ICD-10-CM | POA: Diagnosis not present

## 2018-09-07 DIAGNOSIS — Z20828 Contact with and (suspected) exposure to other viral communicable diseases: Secondary | ICD-10-CM | POA: Diagnosis not present

## 2018-09-07 DIAGNOSIS — N179 Acute kidney failure, unspecified: Secondary | ICD-10-CM

## 2018-09-07 DIAGNOSIS — K5000 Crohn's disease of small intestine without complications: Secondary | ICD-10-CM

## 2018-09-07 DIAGNOSIS — N183 Chronic kidney disease, stage 3 unspecified: Secondary | ICD-10-CM | POA: Diagnosis present

## 2018-09-07 DIAGNOSIS — R269 Unspecified abnormalities of gait and mobility: Secondary | ICD-10-CM

## 2018-09-07 DIAGNOSIS — I5042 Chronic combined systolic (congestive) and diastolic (congestive) heart failure: Secondary | ICD-10-CM | POA: Diagnosis present

## 2018-09-07 DIAGNOSIS — Z87891 Personal history of nicotine dependence: Secondary | ICD-10-CM | POA: Insufficient documentation

## 2018-09-07 DIAGNOSIS — Z79899 Other long term (current) drug therapy: Secondary | ICD-10-CM | POA: Diagnosis not present

## 2018-09-07 DIAGNOSIS — D631 Anemia in chronic kidney disease: Secondary | ICD-10-CM | POA: Insufficient documentation

## 2018-09-07 DIAGNOSIS — E872 Acidosis, unspecified: Secondary | ICD-10-CM | POA: Diagnosis present

## 2018-09-07 DIAGNOSIS — R270 Ataxia, unspecified: Secondary | ICD-10-CM

## 2018-09-07 DIAGNOSIS — F319 Bipolar disorder, unspecified: Secondary | ICD-10-CM | POA: Diagnosis not present

## 2018-09-07 DIAGNOSIS — Z9049 Acquired absence of other specified parts of digestive tract: Secondary | ICD-10-CM | POA: Diagnosis not present

## 2018-09-07 DIAGNOSIS — I130 Hypertensive heart and chronic kidney disease with heart failure and stage 1 through stage 4 chronic kidney disease, or unspecified chronic kidney disease: Secondary | ICD-10-CM | POA: Diagnosis not present

## 2018-09-07 DIAGNOSIS — I951 Orthostatic hypotension: Secondary | ICD-10-CM | POA: Diagnosis not present

## 2018-09-07 LAB — CBC
HCT: 35.8 % — ABNORMAL LOW (ref 39.0–52.0)
Hemoglobin: 11 g/dL — ABNORMAL LOW (ref 13.0–17.0)
MCH: 33.3 pg (ref 26.0–34.0)
MCHC: 30.7 g/dL (ref 30.0–36.0)
MCV: 108.5 fL — ABNORMAL HIGH (ref 80.0–100.0)
Platelets: 237 10*3/uL (ref 150–400)
RBC: 3.3 MIL/uL — ABNORMAL LOW (ref 4.22–5.81)
RDW: 14.6 % (ref 11.5–15.5)
WBC: 10.8 10*3/uL — ABNORMAL HIGH (ref 4.0–10.5)
nRBC: 0 % (ref 0.0–0.2)

## 2018-09-07 LAB — BASIC METABOLIC PANEL
Anion gap: 10 (ref 5–15)
BUN: 59 mg/dL — ABNORMAL HIGH (ref 8–23)
CO2: 16 mmol/L — ABNORMAL LOW (ref 22–32)
Calcium: 9.3 mg/dL (ref 8.9–10.3)
Chloride: 112 mmol/L — ABNORMAL HIGH (ref 98–111)
Creatinine, Ser: 2.76 mg/dL — ABNORMAL HIGH (ref 0.61–1.24)
GFR calc Af Amer: 25 mL/min — ABNORMAL LOW (ref >60)
GFR calc non Af Amer: 22 mL/min — ABNORMAL LOW (ref >60)
Glucose, Bld: 106 mg/dL — ABNORMAL HIGH (ref 70–99)
Potassium: 4.1 mmol/L (ref 3.5–5.1)
Sodium: 138 mmol/L (ref 135–145)

## 2018-09-07 LAB — IRON AND TIBC
Iron: 58 ug/dL (ref 45–182)
Saturation Ratios: 22 % (ref 17.9–39.5)
TIBC: 261 ug/dL (ref 250–450)
UIBC: 203 ug/dL

## 2018-09-07 LAB — URINALYSIS, ROUTINE W REFLEX MICROSCOPIC
Bilirubin Urine: NEGATIVE
Glucose, UA: NEGATIVE mg/dL
Hgb urine dipstick: NEGATIVE
Ketones, ur: NEGATIVE mg/dL
Leukocytes,Ua: NEGATIVE
Nitrite: NEGATIVE
Protein, ur: NEGATIVE mg/dL
Specific Gravity, Urine: 1.02 (ref 1.005–1.030)
pH: 5 (ref 5.0–8.0)

## 2018-09-07 LAB — GLUCOSE, POCT (MANUAL RESULT ENTRY): POC Glucose: 123 mg/dl — AB (ref 70–99)

## 2018-09-07 LAB — HEMOGLOBIN AND HEMATOCRIT, BLOOD
HCT: 36.2 % — ABNORMAL LOW (ref 39.0–52.0)
Hemoglobin: 11.3 g/dL — ABNORMAL LOW (ref 13.0–17.0)

## 2018-09-07 LAB — SARS CORONAVIRUS 2 BY RT PCR (HOSPITAL ORDER, PERFORMED IN ~~LOC~~ HOSPITAL LAB): SARS Coronavirus 2: NEGATIVE

## 2018-09-07 LAB — FERRITIN: Ferritin: 793 ng/mL — ABNORMAL HIGH (ref 24–336)

## 2018-09-07 MED ORDER — EPOETIN ALFA 10000 UNIT/ML IJ SOLN
10000.0000 [IU] | INTRAMUSCULAR | Status: DC
  Administered 2018-09-07: 10000 [IU] via SUBCUTANEOUS
  Filled 2018-09-07: qty 1

## 2018-09-07 MED ORDER — SODIUM CHLORIDE 0.9% FLUSH: 3.0000 mL | Freq: Once | INTRAVENOUS | Status: DC

## 2018-09-07 MED ORDER — METOPROLOL SUCCINATE ER 25 MG PO TB24
12.5000 mg | ORAL_TABLET | Freq: Every day | ORAL | Status: DC
  Administered 2018-09-08: 12.5 mg via ORAL
  Filled 2018-09-07: qty 1

## 2018-09-07 MED ORDER — NON FORMULARY: 10000.0000 [IU] | Status: DC

## 2018-09-07 MED ORDER — SODIUM BICARBONATE 650 MG PO TABS
650.0000 mg | ORAL_TABLET | Freq: Three times a day (TID) | ORAL | Status: DC
  Administered 2018-09-08 – 2018-09-13 (×19): 650 mg via ORAL
  Filled 2018-09-07 (×20): qty 1

## 2018-09-07 NOTE — ED Triage Notes (Signed)
Pt arrives to ED by sister from his PCP office. Pt was seen today as part of routine follow up after being in hospital. At clinic today patient had a resting heart rate of 115 and also reported progressive weakness that has been ongoing for over 1 week. Pt states he lives at home alone and it has been hard getting up and down.

## 2018-09-07 NOTE — Discharge Instructions (Signed)

## 2018-09-07 NOTE — Progress Notes (Signed)
Upon arrival, patient noted to be having difficulty walking with shuffling gait. Patient reports generalized weakness x 1 week since being released from the hospital.  He is scheduled for an appointment with his primary provider,Tiffany Reed, today at 3:00 pm. Primary provider's office called and notified of patient's increased weakness and difficulty walking.

## 2018-09-07 NOTE — H&P (Signed)
History and Physical    Brent [name] WIO:[id number] DOB: 28-Feb-1945 DOA: 09/07/2018  PCP: Brent Curry, DO   Patient coming from: Home   Chief Complaint: Generalized weakness, gait difficulty   HPI: Brent [name] is a 73 y.o. male with medical history significant for chronic combined systolic and diastolic CHF, bipolar disorder, Crohn's disease on Remicade, chronic kidney disease stage III, and recent admission with acute CHF, discharged on 09/02/2018, and now presenting with progressive generalized weakness and gait difficulty.  Patient reports that he seemed to be ambulating okay when he left the hospital, but shortly after this, he noted the insidious development of generalized weakness with gait difficulty.  He denies any headache, change in vision or hearing, or focal numbness or weakness, but feels weak in general and feels as though he has had difficulty with his coordination, manifest an unsteady gait and difficulty picking up a piece of paper off the floor.  There was no recent head trauma.  Denies alcohol or illicit substance use.  Reports continued adherence with his medications.  Saw his PCP today, was noted to be generally weak with shuffling gait and mild resting tachycardia, and was directed to the ED for further evaluation.  The patient denies chest pain, palpitations, cough, shortness of breath, fevers, chills, melena, or hematochezia.  ED Course: Upon arrival to the ED, patient is found to be afebrile, saturating well on room air, mildly tachycardic, and with systolic blood pressure 384.  EKG features sinus tachycardia with rate 111 and chest x-ray is notable for minimal bronchitic changes that have improved.  Chemistry panel is notable for bicarbonate of 16 and creatinine of 2.76, up from an apparent baseline of roughly 1.7.  CBC is notable for mild leukocytosis and stable macrocytic anemia.  Urinalysis is unremarkable.  Patient lives alone and is independent at baseline, but now  unable to ambulate without intensive assistance and hospitalists are consulted for admission.  Review of Systems:  All other systems reviewed and apart from HPI, are negative.  Past Medical History:  Diagnosis Date  . Anemia   . Anxiety   . Benign paroxysmal positional vertigo   . Bipolar I disorder, most recent episode (or current) unspecified   . Celiac disease   . Chronic kidney disease, stage III (moderate) (HCC)   . Crohn's    Remicade q8 weeks  . Depression   . Essential and other specified forms of tremor    medication-induced Parkinson's, now resolved  . Essential hypertension, benign   . Gout 2018  . Hypertrophy of prostate without urinary obstruction and other lower urinary tract symptoms (LUTS)   . Impotence of organic origin   . Insomnia with sleep apnea, unspecified   . Iron deficiency anemia, unspecified   . 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 Neuralgia, neuritis, and radiculitis, unspecified   . Other B-complex deficiencies   . Other extrapyramidal disease and abnormal movement disorder   . Postinflammatory pulmonary fibrosis ([place])   . Tobacco use disorder   . Vertigo 2018    Past Surgical History:  Procedure Laterality Date  . CHOLECYSTECTOMY  07-12-2010  . SMALL INTESTINE SURGERY     x 2     reports that he quit smoking about 4 years ago. His smoking use included cigarettes. He started smoking about 62 years ago. He has a 49.00 pack-year smoking history. He has never used smokeless tobacco. He reports that he does not drink alcohol or use drugs.  Allergies  Allergen Reactions  .  Azathioprine Other (See Comments)    REACTION: affected WBC  . Ciprofloxacin Other (See Comments)    Unknown rxn  . Levaquin [Levofloxacin In D5w] Other (See Comments)    Unknown rxn  . Plendil [Felodipine] Other (See Comments)    Unknown rxn    Family History  Problem Relation Age of Onset  . Diabetes Mother        maternal grandmother  . Uterine cancer Mother   . Emphysema  Father   . Pneumonia Maternal Grandmother   . Colon cancer Neg Hx      Prior to Admission medications   Medication Sig Start Date End Date Taking? Authorizing Provider  allopurinol (ZYLOPRIM) 100 MG tablet Take 2 tablets (200 mg total) by mouth daily. Patient taking differently: Take 100 mg by mouth daily.  04/10/16  Yes Lauree Chandler, NP  cholecalciferol (VITAMIN D) 1000 units tablet Take 1,000 Units by mouth daily.    Yes [provider]  divalproex (DEPAKOTE) 500 MG EC tablet Take 1,500 mg by mouth at bedtime.    Yes [provider]  epoetin alfa (PROCRIT) 02637 UNIT/ML injection Inject 10,000 Units into the skin every 30 (thirty) days.    Yes [provider]  ferrous sulfate 325 (65 FE) MG tablet Take 325 mg by mouth every Monday, Wednesday, and Friday.    Yes [provider]  furosemide (LASIX) 20 MG tablet Take 1 tablet (20 mg total) by mouth 3 (three) times a week. Monday Wednesday and Friday Patient taking differently: Take 20 mg by mouth every Monday, Wednesday, and Friday.  07/09/18  Yes Georgiana Shore, NP  hydrALAZINE (APRESOLINE) 10 MG tablet Take 1 tablet (10 mg total) by mouth 2 (two) times a day. 09/02/18 10/02/18 Yes Florencia Reasons, MD  inFLIXimab (REMICADE) 100 MG injection Infuse Remicade IV schedule 1 54m/kg every 8 weeks Premedicate with Tylenol 500-6539mby mouth and Benadryl 25-5060my mouth prior to infusion. Last PPD was on 12/2009.  11/22/10  Yes JacMilus BanisterD  isosorbide mononitrate (IMDUR) 30 MG 24 hr tablet Take 1 tablet (30 mg total) by mouth daily. 09/02/18  Yes Xu,Florencia ReasonsD  loperamide (IMODIUM) 2 MG capsule Take 4 mg by mouth daily as needed for diarrhea or loose stools.   Yes [provider]  magnesium gluconate (MAGONATE) 30 MG tablet Take 1 tablet (30 mg total) by mouth every Monday, Wednesday, and Friday. 09/03/18  Yes Xu,Florencia ReasonsD  megestrol (MEGACE) 40 MG/ML suspension Take 40 mLs by mouth daily. 08/05/18  Yes  [provider]  metoprolol succinate (TOPROL-XL) 25 MG 24 hr tablet Take 1 tablet (25 mg total) by mouth daily. 09/02/18  Yes Xu,Florencia ReasonsD  OLANZapine (ZYPREXA) 7.5 MG tablet Take 1 tablet (7.5 mg total) by mouth at bedtime. 01/03/16  Yes Reed, Tiffany L, DO  omeprazole (PRILOSEC) 20 MG capsule Take 20 mg by mouth daily before supper.     Yes [provider]  polyethylene glycol (MIRALAX / GLYCOLAX) packet Take 17 g by mouth daily as needed for mild constipation. 05/10/18  Yes Sheikh, Omair Latif, DO  metoprolol succinate (TOPROL XL) 25 MG 24 hr tablet Take 0.5 tablets (12.5 mg total) by mouth at bedtime. 09/02/18 10/02/18  Xu,Florencia ReasonsD    Physical Exam: Vitals:   09/07/18 1656 09/07/18 1930 09/07/18 2030 09/07/18 2045  BP: 128/88 123/75 115/86 121/78  Pulse: (!) 108 (!) 120 (!) 109 (!) 109  Resp: 16 16  18  Temp: 98.2 F (36.8 C)     TempSrc: Oral     SpO2: 100% 100% 97% 98%    Constitutional: NAD, calm  Eyes: PERTLA, lids and conjunctivae normal ENMT: Mucous membranes are moist. Posterior pharynx clear of any exudate or lesions.   Neck: normal, supple, no masses, no thyromegaly Respiratory: no wheezing, no crackles. No accessory muscle use.  Cardiovascular: S1 & S2 heard, regular rate and rhythm. No extremity edema.   Abdomen: No distension, no tenderness, soft. Bowel sounds active.  Musculoskeletal: no clubbing / cyanosis. No joint deformity upper and lower extremities.    Skin: no significant rashes, lesions, ulcers. Warm, dry, well-perfused. Neurologic: CN 2-12 grossly intact. Sensation intact. Strength 5/5 in all 4 limbs.  Psychiatric: Alert and oriented to person, place, and situation. Pleasant, cooperative.    Labs on Admission: I have personally reviewed following labs and imaging studies  CBC: Recent Labs  Lab 09/02/18 0514 09/07/18 1028 09/07/18 1708  WBC 11.0*  --  10.8*  NEUTROABS 6.6  --   --   HGB 10.9* 11.3* 11.0*  HCT 35.3* 36.2* 35.8*  MCV  106.3*  --  108.5*  PLT 247  --  660   Basic Metabolic Panel: Recent Labs  Lab 09/01/18 0608 09/02/18 0514 09/07/18 1708  NA 138 139 138  K 4.5 4.0 4.1  CL 111 110 112*  CO2 15* 15* 16*  GLUCOSE 100* 84 106*  BUN 51* 67* 59*  CREATININE 2.42* 2.46* 2.76*  CALCIUM 7.3* 7.6* 9.3  MG 1.0* 1.0*  --    GFR: Estimated Creatinine Clearance: 22.5 mL/min (A) (by C-G formula based on SCr of 2.76 mg/dL (H)). Liver Function Tests: Recent Labs  Lab 09/02/18 0514  AST 21  ALT 20  ALKPHOS 70  BILITOT 0.2*  PROT 7.6  ALBUMIN 3.6   No results for input(s): LIPASE, AMYLASE in the last 168 hours. No results for input(s): AMMONIA in the last 168 hours. Coagulation Profile: No results for input(s): INR, PROTIME in the last 168 hours. Cardiac Enzymes: No results for input(s): CKTOTAL, CKMB, CKMBINDEX, TROPONINI in the last 168 hours. BNP (last 3 results) No results for input(s): PROBNP in the last 8760 hours. HbA1C: No results for input(s): HGBA1C in the last 72 hours. CBG: No results for input(s): GLUCAP in the last 168 hours. Lipid Profile: No results for input(s): CHOL, HDL, LDLCALC, TRIG, CHOLHDL, LDLDIRECT in the last 72 hours. Thyroid Function Tests: No results for input(s): TSH, T4TOTAL, FREET4, T3FREE, THYROIDAB in the last 72 hours. Anemia Panel: Recent Labs    09/07/18 1028  FERRITIN 793*  TIBC 261  IRON 58   Urine analysis:    Component Value Date/Time   COLORURINE YELLOW 09/07/2018 2130   APPEARANCEUR CLEAR 09/07/2018 2130   LABSPEC 1.020 09/07/2018 2130   PHURINE 5.0 09/07/2018 2130   GLUCOSEU NEGATIVE 09/07/2018 2130   HGBUR NEGATIVE 09/07/2018 2130   BILIRUBINUR NEGATIVE 09/07/2018 2130   BILIRUBINUR neg 10/03/2014 [place] 09/07/2018 2130   PROTEINUR NEGATIVE 09/07/2018 2130   UROBILINOGEN negative 10/03/2014 1152   UROBILINOGEN 0.2 07/10/2010 1709   NITRITE NEGATIVE 09/07/2018 2130   LEUKOCYTESUR NEGATIVE 09/07/2018 2130   Sepsis  Labs: @LABRCNTIP (procalcitonin:4,lacticidven:4) ) Recent Results (from the past 240 hour(s))  SARS Coronavirus 2 (CEPHEID- Performed in [place] hospital lab), Hosp Order     Status: None   Collection Time: 08/31/18  1:34 AM   Specimen: Nasopharyngeal Swab  Result Value Ref Range Status  SARS Coronavirus 2 NEGATIVE NEGATIVE Final    Comment: (NOTE) If result is NEGATIVE SARS-CoV-2 target nucleic acids are NOT DETECTED. The SARS-CoV-2 RNA is generally detectable in upper and lower  respiratory specimens during the acute phase of infection. The lowest  concentration of SARS-CoV-2 viral copies this assay can detect is 250  copies / mL. A negative result does not preclude SARS-CoV-2 infection  and should not be used as the sole basis for treatment or other  patient management decisions.  A negative result may occur with  improper specimen collection / handling, submission of specimen other  than nasopharyngeal swab, presence of viral mutation(s) within the  areas targeted by this assay, and inadequate number of viral copies  (<250 copies / mL). A negative result must be combined with clinical  observations, patient history, and epidemiological information. If result is POSITIVE SARS-CoV-2 target nucleic acids are DETECTED. The SARS-CoV-2 RNA is generally detectable in upper and lower  respiratory specimens dur ing the acute phase of infection.  Positive  results are indicative of active infection with SARS-CoV-2.  Clinical  correlation with patient history and other diagnostic information is  necessary to determine patient infection status.  Positive results do  not rule out bacterial infection or co-infection with other viruses. If result is PRESUMPTIVE POSTIVE SARS-CoV-2 nucleic acids MAY BE PRESENT.   A presumptive positive result was obtained on the submitted specimen  and confirmed on repeat testing.  While 2019 novel coronavirus  (SARS-CoV-2) nucleic acids may be present in  the submitted sample  additional confirmatory testing may be necessary for epidemiological  and / or clinical management purposes  to differentiate between  SARS-CoV-2 and other Sarbecovirus currently known to infect humans.  If clinically indicated additional testing with an alternate test  methodology [phone]) is advised. The SARS-CoV-2 RNA is generally  detectable in upper and lower respiratory sp ecimens during the acute  phase of infection. The expected result is Negative. Fact Sheet for Patients:  StrictlyIdeas.no Fact Sheet for Healthcare Providers: BankingDealers.co.za This test is not yet approved or cleared by the [place] FDA and has been authorized for detection and/or diagnosis of SARS-CoV-2 by FDA under an Emergency Use Authorization (EUA).  This EUA will remain in effect (meaning this test can be used) for the duration of the COVID-19 declaration under Section 564(b)(1) of the Act, 21 U.S.C. section 360bbb-3(b)(1), unless the authorization is terminated or revoked sooner. Performed at [facility], [place] [address] [place], [place] [address]      Radiological Exams on Admission: Dg Chest 2 View  Result Date: 09/07/2018 CLINICAL DATA:  Weakness for 1 day. Ex-smoker. EXAM: CHEST - 2 VIEW COMPARISON:  09/01/2018. FINDINGS: Normal sized heart. Mildly tortuous aorta. Clear lungs. Minimal peribronchial thickening with improvement. Mild thoracic spine degenerative changes. IMPRESSION: Minimal bronchitic changes with improvement. Electronically Signed   By: Claudie Revering M.D.   On: 09/07/2018 21:16   Ct Head Wo Contrast  Result Date: 09/07/2018 CLINICAL DATA:  Ataxia EXAM: CT HEAD WITHOUT CONTRAST TECHNIQUE: Contiguous axial images were obtained from the base of the skull through the vertex without intravenous contrast. COMPARISON:  02/07/2018 FINDINGS: Brain: Age related volume loss. No acute intracranial  abnormality. Specifically, no hemorrhage, hydrocephalus, mass lesion, acute infarction, or significant intracranial injury. Vascular: No hyperdense vessel or unexpected calcification. Skull: No acute calvarial abnormality. Sinuses/Orbits: Mucosal thickening in the left maxillary sinus. No acute findings. Other: None IMPRESSION: No acute intracranial abnormality. Electronically Signed   By:  Rolm Baptise M.D.   On: 09/07/2018 21:55    EKG: Independently reviewed. Sinus tachycardia (rate 111), QTc 440 ms.   Assessment/Plan   1. Ataxia, lethargy - Presents with ~1 wk of progressive lethargy, generalized weakness, and shuffling gait  - Head CT is negative for acute findings and there is no focal numbness of weakness on exam  - Check inflammatory markers, valproate level, LFT's, CK, vitamin levels, TSH, and magnesium; consult with PT; consider MRI brain if pending labs are not revealing and his neurologic difficulties persist   2. Acute kidney injury superimposed on CKD III; acidosis  - SCr is 2.76 on admission, up from apparent baseline of ~1.7; serum bicarbonate is 16  - Likely prerenal azotemia in setting of poor appetite, recent diuresis in hospital, will check FEUrea, renally-dose medications, start bicarbonate, and repeat chem panel in am    3. Chronic combined systolic & diastolic CHF  - Appears compensated  - Hold Lasix initially, follow daily wt, continue beta-blocker as tolerated   4. Macrocytic anemia  - Stable, no bleeding, managed outpatient with iron and Procrit    5. Orthostasis  - >20 mmHg drop in SBP upon standing in ED, could be related to recent diuresis and/or autonomic dysfunction  - Hold Lasix initially as above, repeat orthostatic vitals tomorrow    6. Bipolar disorder  - Stable, hold potentially sedating medications initially    7. Crohn's disease  - Stable, managed with Remicade    PPE: Mask, face shield. Patient wearing mask.  DVT prophylaxis: sq heparin  Code  Status: DNR Family Communication: Discussed with patient  Consults called: none Admission status: Observation     Vianne Bulls, MD [facility] Pager [phone]  If 7PM-7AM, please contact night-coverage [email or web address] Password [facility]  09/07/2018, 11:45 PM

## 2018-09-07 NOTE — ED Notes (Signed)
PT ambulated with moderate assistance, pt had unsteady and shuffled gait. PT denies dizziness, and weakness while ambulating. PT did express concern without assistance he does not believe he would be able to ambulate.

## 2018-09-07 NOTE — Progress Notes (Signed)
PATIENT CARE CENTER NOTE  Diagnosis:Anemia associated with Chronic Renal Failure, anemia associated with renal disease  Provider:Patel, Ulice Dash MD   Procedure:Epoetin Alfa (Procrit) injection   Note:Patient received sub-q Procrit injection in right arm. Tolerated well. Labs drawn pre-injection and Hemoglobin was 11.3. Patient BP was 126/84.  Discharge instructions given. Patient alert, oriented and ambulatory to wheelchair at discharge.

## 2018-09-07 NOTE — ED Provider Notes (Signed)
[facility] EMERGENCY DEPARTMENT Provider Note   CSN: [id number] Arrival date & time: 09/07/18  1651    History   Chief Complaint Chief Complaint  Patient presents with  . Tachycardia  . Weakness    HPI Brent [name] is a 73 y.o. male.     HPI Pt was recently admitted to the hospital for difficulty breathing on 7/7.  He was treated for AKI, anemia, CHF exacerbation.  Pt was feeling better when he left the hospital but over the last 5 days he has been feeling weaker.  He is having trouble walking.  No trouble with his breathing.  No fevers  Or chills.   He denies any focal weakness but feels very unsteady.  He went to his doctor today and was unable to get out of the car without assistance which is not normal for him.  He was sent for further evaluation. Past Medical History:  Diagnosis Date  . Anemia   . Anxiety   . Benign paroxysmal positional vertigo   . Bipolar I disorder, most recent episode (or current) unspecified   . Celiac disease   . Chronic kidney disease, stage III (moderate) (HCC)   . Crohn's    Remicade q8 weeks  . Depression   . Essential and other specified forms of tremor    medication-induced Parkinson's, now resolved  . Essential hypertension, benign   . Gout 2018  . Hypertrophy of prostate without urinary obstruction and other lower urinary tract symptoms (LUTS)   . Impotence of organic origin   . Insomnia with sleep apnea, unspecified   . Iron deficiency anemia, unspecified   . 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 Neuralgia, neuritis, and radiculitis, unspecified   . Other B-complex deficiencies   . Other extrapyramidal disease and abnormal movement disorder   . Postinflammatory pulmonary fibrosis ([place])   . Tobacco use disorder   . Vertigo 2018    Patient Active Problem List   Diagnosis Date Noted  . Ataxia 09/07/2018  . AKI (acute kidney injury) ([place])   . Immunosuppressed status ([place])   . Macrocytic anemia   . Acute on chronic systolic  (congestive) heart failure ([place]) 08/31/2018  . Acute on chronic combined systolic (congestive) and diastolic (congestive) heart failure ([place]) 08/31/2018  . Chronic combined systolic and diastolic congestive heart failure ([place]) 06/03/2018  . Acute systolic CHF (congestive heart failure) ([place])   . Acute respiratory failure with hypoxia ([place]) 05/05/2018  . Multifocal pneumonia 05/05/2018  . Acute pulmonary edema ([place]) 05/05/2018  . Candida esophagitis ([place]) 09/22/2017  . History of smoking 30 or more pack years 01/05/2017  . Bipolar 1 disorder ([place])   . BPH (benign prostatic hyperplasia) 03/31/2013  . 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 Essential hypertension, benign 03/31/2013  . Chronic kidney disease (CKD), stage III (moderate) ([place]) 06/04/2012  . 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 CHOLECYSTECTOMY  07-12-2010  . SMALL INTESTINE SURGERY     x 2        Home Medications    Prior to Admission medications   Medication Sig Start Date End Date Taking? Authorizing Provider  allopurinol (ZYLOPRIM) 100 MG tablet Take 2 tablets (200 mg total) by mouth daily. Patient taking differently: Take 100 mg by mouth daily.  04/10/16   Lauree Chandler, NP  cholecalciferol (VITAMIN D) 1000 units tablet Take 1,000 Units by mouth daily.     [provider]  divalproex (DEPAKOTE) 500 MG EC tablet Take 1,500 mg by mouth at bedtime.     [provider]  epoetin alfa (PROCRIT) 81191 UNIT/ML injection Inject 10,000 Units into the skin every 30 (thirty) days.     [provider]  ferrous sulfate 325 (65 FE) MG tablet Take 325 mg by mouth every Monday, Wednesday, and Friday.     [provider]  furosemide (LASIX) 20 MG tablet Take 1 tablet (20 mg total) by mouth 3 (three) times a week. Monday Wednesday and Friday 07/09/18   Georgiana Shore, NP  hydrALAZINE (APRESOLINE) 10 MG tablet Take 1 tablet (10  mg total) by mouth 2 (two) times a day. 09/02/18 10/02/18  Florencia Reasons, MD  inFLIXimab (REMICADE) 100 MG injection Infuse Remicade IV schedule 1 50m/kg every 8 weeks Premedicate with Tylenol 500-65105mby mouth and Benadryl 25-5069my mouth prior to infusion. Last PPD was on 12/2009.  11/22/10   JacMilus BanisterD  isosorbide mononitrate (IMDUR) 30 MG 24 hr tablet Take 1 tablet (30 mg total) by mouth daily. 09/02/18   Xu,Florencia ReasonsD  loperamide (IMODIUM) 2 MG capsule Take 4 mg by mouth daily as needed for diarrhea or loose stools.    [provider]  magnesium gluconate (MAGONATE) 30 MG tablet Take 1 tablet (30 mg total) by mouth every Monday, Wednesday, and Friday. 09/03/18   Xu,Florencia ReasonsD  megestrol (MEGACE) 40 MG/ML suspension Take 40 mLs by mouth daily. 08/05/18   [provider]  metoprolol succinate (TOPROL XL) 25 MG 24 hr tablet Take 0.5 tablets (12.5 mg total) by mouth at bedtime. 09/02/18 10/02/18  Xu,Florencia ReasonsD  metoprolol succinate (TOPROL-XL) 25 MG 24 hr tablet Take 1 tablet (25 mg total) by mouth daily. 09/02/18   Xu,Florencia ReasonsD  OLANZapine (ZYPREXA) 7.5 MG tablet Take 1 tablet (7.5 mg total) by mouth at bedtime. 01/03/16   Reed, Tiffany L, DO  omeprazole (PRILOSEC) 20 MG capsule Take 20 mg by mouth daily before supper.      [provider]  polyethylene glycol (MIRALAX / GLYCOLAX) packet Take 17 g by mouth daily as needed for mild constipation. 05/10/18   SheKerney ElbeO    Family History Family History  Problem Relation Age of Onset  . Diabetes Mother        maternal grandmother  . Uterine cancer Mother   . Emphysema Father   . Pneumonia Maternal Grandmother   . Colon cancer Neg Hx     Social History Social History   Tobacco Use  . Smoking status: Former Smoker    Packs/day: 1.00    Years: 49.00    Pack years: 49.00    Types: Cigarettes    Start date: 04/11/1956    Quit date: 04/17/2014    Years since quitting: 4.3  . Smokeless tobacco: Never Used   Substance Use Topics  . Alcohol use: No    Alcohol/week: 0.0 standard drinks  . Drug use: No     Allergies   Azathioprine, Ciprofloxacin, Levaquin [levofloxacin in d5w], and Plendil [felodipine]   Review of Systems Review of Systems  All other systems reviewed and are negative.    Physical Exam Updated Vital Signs BP 121/78   Pulse (!) 109   Temp 98.2 F (36.8 C) (Oral)   Resp 18   SpO2 98%   Physical Exam Vitals signs and nursing note reviewed.  Constitutional:      General: He is  not in acute distress.    Appearance: He is well-developed.  HENT:     Head: Normocephalic and atraumatic.     Right Ear: External ear normal.     Left Ear: External ear normal.  Eyes:     General: No scleral icterus.       Right eye: No discharge.        Left eye: No discharge.     Conjunctiva/sclera: Conjunctivae normal.  Neck:     Musculoskeletal: Neck supple.     Trachea: No tracheal deviation.  Cardiovascular:     Rate and Rhythm: Regular rhythm. Tachycardia present.  Pulmonary:     Effort: Pulmonary effort is normal. No respiratory distress.     Breath sounds: Normal breath sounds. No stridor. No wheezing or rales.  Abdominal:     General: Bowel sounds are normal. There is no distension.     Palpations: Abdomen is soft.     Tenderness: There is no abdominal tenderness. There is no guarding or rebound.  Musculoskeletal:        General: No tenderness.  Skin:    General: Skin is warm and dry.     Findings: No rash.  Neurological:     Mental Status: He is alert.     Cranial Nerves: No cranial nerve deficit (no facial droop, extraocular movements intact, no slurred speech).     Sensory: No sensory deficit.     Motor: No abnormal muscle tone or seizure activity.     Coordination: Coordination normal.     Gait: Gait abnormal.     Comments: Equal grip strength bilaterally, able to lift both legs off the bed, patient was not able to walk without max assistance      ED  Treatments / Results  Labs (all labs ordered are listed, but only abnormal results are displayed) Labs Reviewed  BASIC METABOLIC PANEL - Abnormal; Notable for the following components:      Result Value   Chloride 112 (*)    CO2 16 (*)    Glucose, Bld 106 (*)    BUN 59 (*)    Creatinine, Ser 2.76 (*)    GFR calc non Af Amer 22 (*)    GFR calc Af Amer 25 (*)    All other components within normal limits  CBC - Abnormal; Notable for the following components:   WBC 10.8 (*)    RBC 3.30 (*)    Hemoglobin 11.0 (*)    HCT 35.8 (*)    MCV 108.5 (*)    All other components within normal limits  SARS CORONAVIRUS 2 (HOSPITAL ORDER, [facility])  URINALYSIS, ROUTINE W REFLEX MICROSCOPIC  CBG MONITORING, ED    EKG EKG Interpretation  Date/Time:  Tuesday September 07 2018 17:00:15 EDT Ventricular Rate:  111 PR Interval:  138 QRS Duration: 80 QT Interval:  324 QTC Calculation: 440 R Axis:   29 Text Interpretation:  Sinus tachycardia Septal infarct , age undetermined Abnormal ECG No significant change since last tracing Confirmed by Dorie Rank [phone]) on 09/07/2018 11:00:43 PM   Radiology Dg Chest 2 View  Result Date: 09/07/2018 CLINICAL DATA:  Weakness for 1 day. Ex-smoker. EXAM: CHEST - 2 VIEW COMPARISON:  09/01/2018. FINDINGS: Normal sized heart. Mildly tortuous aorta. Clear lungs. Minimal peribronchial thickening with improvement. Mild thoracic spine degenerative changes. IMPRESSION: Minimal bronchitic changes with improvement. Electronically Signed   By: Claudie Revering M.D.   On: 09/07/2018 21:16   Ct  Head Wo Contrast  Result Date: 09/07/2018 CLINICAL DATA:  Ataxia EXAM: CT HEAD WITHOUT CONTRAST TECHNIQUE: Contiguous axial images were obtained from the base of the skull through the vertex without intravenous contrast. COMPARISON:  02/07/2018 FINDINGS: Brain: Age related volume loss. No acute intracranial abnormality. Specifically, no hemorrhage, hydrocephalus,  mass lesion, acute infarction, or significant intracranial injury. Vascular: No hyperdense vessel or unexpected calcification. Skull: No acute calvarial abnormality. Sinuses/Orbits: Mucosal thickening in the left maxillary sinus. No acute findings. Other: None IMPRESSION: No acute intracranial abnormality. Electronically Signed   By: Rolm Baptise M.D.   On: 09/07/2018 21:55    Procedures Procedures (including critical care time)  Medications Ordered in ED Medications  sodium chloride flush (NS) 0.9 % injection 3 mL (has no administration in time range)     Initial Impression / Assessment and Plan / ED Course  I have reviewed the triage vital signs and the nursing notes.  Pertinent labs & imaging results that were available during my care of the patient were reviewed by me and considered in my medical decision making (see chart for details).  Clinical Course as of Sep 06 2305  Tue Sep 07, 2018  2303 Labs show a stable anemia.  Persistent chronic kidney disease with worsening bicarb.   [JK]    Clinical Course User Index [JK] Dorie Rank, MD  Patient presented with new weakness after his recent hospitalization.  No signs of acute findings on chest x-ray.  CT scan does not show any acute abnormalities.  Patient is having difficulty with his gait.   Unclear etiology.  ?metabolic, ?occult CVA.  Will admit for further work-up.  Final Clinical Impressions(s) / ED Diagnoses   Final diagnoses:  Ataxia      Dorie Rank, MD 09/07/18 2309

## 2018-09-07 NOTE — Telephone Encounter (Signed)
Caryl Pina with [facility] called and stated that she just wanted to let you know that patient was there this morning and she had some concerns with patient's increase weakness and shuffling gait. Stated that she knows patient has had a history with Medication Induced Parkinson's and knows he was just released from the hospital.  Stated she just wanted to make you aware for his appointment this afternoon.

## 2018-09-07 NOTE — Patient Instructions (Signed)
Send to ED patient decline going via EMS

## 2018-09-07 NOTE — Progress Notes (Signed)
Provider: Andrina Locken FNP-C  Gayland Curry, DO  Patient Care Team: Gayland Curry, DO as PCP - General (Geriatric Medicine) Martinique, Peter M, MD as PCP - Cardiology (Cardiology) Larey Dresser, MD as PCP - Advanced Heart Failure (Cardiology) Elmarie Shiley, MD (Nephrology) Milus Banister, MD (Gastroenterology) Tobi Bastos, RN as [facility]  Extended Emergency Contact Information Primary Emergency Contact: Gerber Mobile Phone: [phone] Relation: Sister  Code Status: DNR Goals of care: Advanced Directive information Advanced Directives 09/03/2018  Does Patient Have a Medical Advance Directive? No  Type of Advance Directive -  Does patient want to make changes to medical advance directive? -  Copy of [place] in Chart? -  Would patient like information on creating a medical advance directive? -  Pre-existing out of facility DNR order (yellow form or pink MOST form) -     Chief Complaint  Patient presents with   Transitions Of Care    Hospitalization from 08/31/2018 - 09/02/2018 Shortness of Breath  patient states he has been feeling very weak since leaving hospital and can barely stand, also states he fell this morning but was not injured.     HPI:  Pt is a 73 y.o. male seen today for transition of care post hospitalization from 08/31/2018 -09/02/2018 for shortness of breath.He was admitted for acute respiratory failure He states has been very weak since discharged from the hospital.He had CXR and SARS chowbey 2 testing that was negative.His blood work showed elevated BNP and Troponin.He was treated with lasix and solu-medrol and oxygen supplementation 15 L nonrebreather and later titrated off.He was treated with albuterol inhaler.CR 1.73; 2.4  was also high with low B/p.He has Nonsustained SVT/NSVT.His BB dose was increased to 25 mg every morning and 12.5 mg QPM per cardiology recommendation.Mg level was low and  replaced. hemoglobin remained at baseline was continued on procrit injection monthly with ferrous sulfate 325 mg tablet every Monday,wednesday and Friday. He also had generalized esophageal wall thicken with mildly enlarged periesophagela lymph node incidental findings on Chest CT scan.He denied any symptoms.currently on omeprazole at bedtime.recommended to follow up with GI.  Bipolar - stable on Xyprexa and depakote at bedtime.   He complains of generalized weakness.He states can barely stand.He states fell this morning no injuries sustained.He states appetite is good but doesn't understand why he is so weak.He denies any fever,chills,worsen cough,shortness of breath or symptoms of urinary tract infections.He states drove himself to visit today but was unable to walk into the office lobby CMA had to assist him onto a wheelchair.    Past Medical History:  Diagnosis Date   Anemia    Anxiety    Benign paroxysmal positional vertigo    Bipolar I disorder, most recent episode (or current) unspecified    Celiac disease    Chronic kidney disease, stage III (moderate) (HCC)    Crohn's    Remicade q8 weeks   Depression    Essential and other specified forms of tremor    medication-induced Parkinson's, now resolved   Essential hypertension, benign    Gout 2018   Hypertrophy of prostate without urinary obstruction and other lower urinary tract symptoms (LUTS)    Impotence of organic origin    Insomnia with sleep apnea, unspecified    Iron deficiency anemia, unspecified    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   Neuralgia, neuritis, and radiculitis, unspecified    Other B-complex deficiencies    Other extrapyramidal disease and abnormal movement  disorder    Postinflammatory pulmonary fibrosis (HCC)    Tobacco use disorder    Vertigo 2018   Past Surgical History:  Procedure Laterality Date   CHOLECYSTECTOMY  07-12-2010   SMALL INTESTINE SURGERY     x 2    Allergies  Allergen  Reactions   Azathioprine Other (See Comments)    REACTION: affected WBC   Ciprofloxacin Other (See Comments)    Unknown rxn   Levaquin [Levofloxacin In D5w] Other (See Comments)    Unknown rxn   Plendil [Felodipine] Other (See Comments)    Unknown rxn    Outpatient Encounter Medications as of 09/07/2018  Medication Sig   allopurinol (ZYLOPRIM) 100 MG tablet Take 2 tablets (200 mg total) by mouth daily. (Patient taking differently: Take 100 mg by mouth daily. )   cholecalciferol (VITAMIN D) 1000 units tablet Take 1,000 Units by mouth daily.    divalproex (DEPAKOTE) 500 MG EC tablet Take 1,500 mg by mouth at bedtime.    epoetin alfa (PROCRIT) 18299 UNIT/ML injection Inject 10,000 Units into the skin every 30 (thirty) days.    ferrous sulfate 325 (65 FE) MG tablet Take 325 mg by mouth every Monday, Wednesday, and Friday.    furosemide (LASIX) 20 MG tablet Take 1 tablet (20 mg total) by mouth 3 (three) times a week. Monday Wednesday and Friday   hydrALAZINE (APRESOLINE) 10 MG tablet Take 1 tablet (10 mg total) by mouth 2 (two) times a day.   inFLIXimab (REMICADE) 100 MG injection Infuse Remicade IV schedule 1 72m/kg every 8 weeks Premedicate with Tylenol 500-653mby mouth and Benadryl 25-5064my mouth prior to infusion. Last PPD was on 12/2009.    isosorbide mononitrate (IMDUR) 30 MG 24 hr tablet Take 1 tablet (30 mg total) by mouth daily.   loperamide (IMODIUM) 2 MG capsule Take 4 mg by mouth daily as needed for diarrhea or loose stools.   magnesium gluconate (MAGONATE) 30 MG tablet Take 1 tablet (30 mg total) by mouth every Monday, Wednesday, and Friday.   megestrol (MEGACE) 40 MG/ML suspension Take 40 mLs by mouth daily.   metoprolol succinate (TOPROL XL) 25 MG 24 hr tablet Take 0.5 tablets (12.5 mg total) by mouth at bedtime.   metoprolol succinate (TOPROL-XL) 25 MG 24 hr tablet Take 1 tablet (25 mg total) by mouth daily.   OLANZapine (ZYPREXA) 7.5 MG tablet Take 1  tablet (7.5 mg total) by mouth at bedtime.   omeprazole (PRILOSEC) 20 MG capsule Take 20 mg by mouth daily before supper.     polyethylene glycol (MIRALAX / GLYCOLAX) packet Take 17 g by mouth daily as needed for mild constipation.   Facility-Administered Encounter Medications as of 09/07/2018  Medication   epoetin alfa (EPOGEN) injection 10,000 Units    Review of Systems  Constitutional: Positive for fatigue. Negative for appetite change, chills and fever.  HENT: Negative for congestion, sinus pressure, sinus pain, sneezing and sore throat.   Eyes: Positive for visual disturbance. Negative for discharge, redness and itching.       Wears eye glasses   Respiratory: Positive for shortness of breath. Negative for cough, chest tightness and wheezing.   Cardiovascular: Negative for chest pain, palpitations and leg swelling.  Gastrointestinal: Negative for abdominal distention, abdominal pain, blood in stool, constipation, diarrhea, nausea and vomiting.       LBM 09/07/2018  Genitourinary: Negative for difficulty urinating, dysuria, flank pain and urgency.  Musculoskeletal: Positive for gait problem.  Skin: Negative for color change,  pallor and rash.       Had procrit injection prior to visit.  Neurological: Negative for dizziness, tremors, speech difficulty, light-headedness, numbness and headaches.       Generalized weakness   Hematological: Does not bruise/bleed easily.  Psychiatric/Behavioral: Positive for sleep disturbance. Negative for agitation and confusion. The patient is not nervous/anxious.     Immunization History  Administered Date(s) Administered   Influenza, High Dose Seasonal PF 11/26/2016   Influenza,inj,Quad PF,6+ Mos 11/13/2014, 01/03/2016   Influenza-Unspecified 10/25/2012, 11/24/2016, 11/24/2017   PPD Test 12/30/2010, 01/05/2012, 01/07/2013, 01/13/2014   Pneumococcal Conjugate-13 08/10/2014   Pneumococcal Polysaccharide-23 03/05/2004, 01/03/2016, 05/06/2018    Tdap 05/26/2011   Pertinent  Health Maintenance Due  Topic Date Due   INFLUENZA VACCINE  09/25/2018   COLONOSCOPY  09/17/2027   PNA vac Low Risk Adult  Completed   Fall Risk  09/07/2018 09/03/2018 06/03/2018 04/30/2018 03/12/2018  Falls in the past year? 1 0 0 0 1  Number falls in past yr: 0 - 0 0 0  Injury with Fall? 0 - 0 0 0    Vitals:   09/07/18 1517  BP: 110/70  Pulse: (!) 115  Temp: 98.9 F (37.2 C)  TempSrc: Oral  SpO2: 98%  Height: 6' 4"  (1.93 m)   Body mass index is 17.93 kg/m. Physical Exam Vitals signs reviewed.  Constitutional:      General: He is not in acute distress.    Appearance: He is underweight.     Comments: generalized weakness required assistance to wheelchair   HENT:     Head: Normocephalic.     Nose: No congestion or rhinorrhea.     Mouth/Throat:     Mouth: Mucous membranes are moist.     Pharynx: Oropharynx is clear. No oropharyngeal exudate or posterior oropharyngeal erythema.  Eyes:     General: No scleral icterus.       Right eye: No discharge.        Left eye: No discharge.     Conjunctiva/sclera: Conjunctivae normal.     Pupils: Pupils are equal, round, and reactive to light.     Comments: Corrective lens in place   Neck:     Musculoskeletal: Normal range of motion. No neck rigidity or muscular tenderness.  Cardiovascular:     Rate and Rhythm: Regular rhythm. Tachycardia present.     Pulses: Normal pulses.     Heart sounds: Normal heart sounds. No murmur. No friction rub. No gallop.   Pulmonary:     Effort: Pulmonary effort is normal. No respiratory distress.     Breath sounds: Normal breath sounds. No wheezing, rhonchi or rales.  Chest:     Chest wall: No tenderness.  Abdominal:     General: Bowel sounds are normal. There is no distension.     Palpations: Abdomen is soft. There is no mass.     Tenderness: There is no abdominal tenderness. There is no right CVA tenderness, left CVA tenderness, guarding or rebound.   Musculoskeletal:        General: No swelling or tenderness.     Right lower leg: No edema.     Left lower leg: No edema.     Comments: Unsteady gait   Lymphadenopathy:     Cervical: No cervical adenopathy.  Skin:    General: Skin is warm and dry.     Coloration: Skin is not pale.     Findings: No bruising, erythema or rash.  Neurological:  Mental Status: He is alert.     Sensory: No sensory deficit.     Gait: Gait abnormal.     Comments: Generalized weakness   Psychiatric:        Mood and Affect: Mood normal.        Behavior: Behavior normal.        Thought Content: Thought content normal.        Judgment: Judgment normal.     Labs reviewed: Recent Labs    05/08/18 0256 05/09/18 0251 05/10/18 0330  08/31/18 0134 09/01/18 0608 09/02/18 0514  NA 141 143 142   < > 142 138 139  K 3.9 3.9 4.1   < > 3.9 4.5 4.0  CL 108 112* 112*   < > 119* 111 110  CO2 26 27 25    < > 18* 15* 15*  GLUCOSE 99 95 86   < > 104* 100* 84  BUN 28* 26* 20   < > 28* 51* 67*  CREATININE 2.13* 1.86* 1.82*   < > 1.73* 2.42* 2.46*  CALCIUM 8.5* 8.1* 8.4*   < > 7.0* 7.3* 7.6*  MG 1.9 1.8 1.8  --   --  1.0* 1.0*  PHOS 3.0 3.1 3.0  --   --   --   --    < > = values in this interval not displayed.   Recent Labs    05/09/18 0251 05/10/18 0330 06/14/18 09/02/18 0514  AST 11* 12* 13* 21  ALT 8 8 7* 20  ALKPHOS 50 45 88 70  BILITOT 0.4 0.6  --  0.2*  PROT 5.4* 5.4*  --  7.6  ALBUMIN 2.5* 2.4*  --  3.6   Recent Labs    05/12/18 0629  06/14/18  08/31/18 0134 09/02/18 0514 09/07/18 1028  WBC 10.6*  --  13.7  --  12.3* 11.0*  --   NEUTROABS 5.3  --   --   --  10.3* 6.6  --   HGB 10.0*   < > 12.1*   < > 9.7* 10.9* 11.3*  HCT 34.6*  --  36*   < > 31.5* 35.3* 36.2*  MCV 111.6*  --   --   --  109.4* 106.3*  --   PLT 166  --  254  --  199 247  --    < > = values in this interval not displayed.   Lab Results  Component Value Date   TSH 0.379 09/01/2018   Lab Results  Component Value Date    HGBA1C 4.9 08/10/2015   Lab Results  Component Value Date   CHOL 138 05/04/2017   HDL 42 05/04/2017   LDLCALC 71 05/04/2017   TRIG 176 (H) 05/04/2017   CHOLHDL 3.3 05/04/2017    Significant Diagnostic Results in last 30 days:  Dg Chest 2 View  Result Date: 09/01/2018 CLINICAL DATA:  Shortness of breath history of Crohn's disease EXAM: CHEST - 2 VIEW COMPARISON:  August 31, 2018 FINDINGS: The heart size and mediastinal contours are stable. Both lungs are clear. The visualized skeletal structures are unremarkable. IMPRESSION: No active cardiopulmonary disease. Electronically Signed   By: Abelardo Diesel M.D.   On: 09/01/2018 08:25   Dg Chest 2 View  Result Date: 08/21/2018 CLINICAL DATA:  Shortness of breath and wheezing EXAM: CHEST - 2 VIEW COMPARISON:  May 12, 2018 FINDINGS: There is slight scarring in the right upper lobe. There is no evident edema or consolidation. Heart is upper normal  in size with pulmonary vascularity normal. No adenopathy. There is degenerative change in the thoracic spine. IMPRESSION: Slight scarring right upper lobe. No edema or consolidation. Heart upper normal in size. Electronically Signed   By: Lowella Grip III M.D.   On: 08/21/2018 09:58   Ct Chest Wo Contrast  Result Date: 08/31/2018 CLINICAL DATA:  Shortness of breath. Volume overload with heart failure suspected EXAM: CT CHEST WITHOUT CONTRAST TECHNIQUE: Multidetector CT imaging of the chest was performed following the standard protocol without IV contrast. COMPARISON:  Chest x-ray from earlier today and chest CT 05/05/2018 FINDINGS: Cardiovascular: Generous heart size with trace pericardial fluid and/or thickening. Mild coronary calcification. Mediastinum/Nodes: Circumferential prominent esophageal wall thickness but stable and generalized. This thickening is less extensive than seen in 2018. Enlarged lower right periesophageal lymph node that is stable since 2018 at least, presumably reactive and related to  the esophagus. Lungs/Pleura: Trace pleural fluid on the right more than left with mild atelectasis. Subpleural reticulation in the upper lobes is scarring when correlated with 2018 CT. Mild emphysema. There is no edema, consolidation, or pneumothorax. Upper Abdomen: Bilateral renal cystic densities. There is cholecystectomy which accounts for prominent bile duct diameter. Musculoskeletal: Degenerative changes without acute finding. IMPRESSION: 1. Trace pleural effusions with atelectasis.  No pulmonary edema. 2. Chronic generalized esophageal wall thickening with mildly enlarged periesophageal lymph node, please correlate for esophagitis symptoms. Electronically Signed   By: Monte Fantasia M.D.   On: 08/31/2018 13:35   Dg Chest Port 1 View  Result Date: 08/31/2018 CLINICAL DATA:  Shortness of breath and wheezing EXAM: PORTABLE CHEST 1 VIEW COMPARISON:  Chest x-ray dated August 20, 2018 FINDINGS: The heart size is enlarged. There are new ground-glass airspace opacities bilaterally. There is no pneumothorax. No large pleural effusion. There is no acute osseous abnormality. IMPRESSION: Cardiomegaly with findings suspicious for developing pulmonary edema. An atypical infectious process can have a similar appearance in the appropriate clinical setting. Electronically Signed   By: Constance Holster M.D.   On: 08/31/2018 02:22    Assessment/Plan 1. Generalized Weakness Afebrile.Required assistance to wheelchair upon arrival to [facility] office.Reports good appetite bu does not understand why he is so weak.No signs of URI's or UTI.Status post Hospital admission 08/31/18 -09/02/2018 for shortness of breath.patient lives by himself at home not safe to send home.self report fall this morning.No injuries.Recommended sending him to the ED via EMS for evaluation of weakness.Patient agrees with plan for ED but would like to be taken by family member due to cost of EMS.He has called sister to come and pick him.  - POC Glucose (CBG)  123 today  2. Chronic combined systolic and diastolic congestive heart failure (HCC) No signs of fluid overload.On Furosemide every other day,Imdur 30 mg daily and metoprolol 25 mg tablet daily.will send to ED for generalized weakness.   3. Gait abnormality Sustained a fall this morning at home.he remains very high risk for falls.Sent to ED for further evaluation.   4. Chronic kidney disease (CKD), stage III (moderate) (HCC) Recent level CR 1.73; 2.4 high.continue to avoid nephrotoxins and dose all other medications for renal clearance.  Family/ staff Communication: Reviewed plan of care with patient.Patient sent to ED via private car with sister per patient's request.  Labs/tests ordered: None   Sandrea Hughs, NP

## 2018-09-07 NOTE — ED Notes (Signed)
Patient transported to X-ray 

## 2018-09-08 ENCOUNTER — Other Ambulatory Visit: Payer: Self-pay

## 2018-09-08 DIAGNOSIS — I951 Orthostatic hypotension: Secondary | ICD-10-CM | POA: Diagnosis not present

## 2018-09-08 DIAGNOSIS — N183 Chronic kidney disease, stage 3 (moderate): Secondary | ICD-10-CM | POA: Diagnosis not present

## 2018-09-08 DIAGNOSIS — N179 Acute kidney failure, unspecified: Secondary | ICD-10-CM | POA: Diagnosis not present

## 2018-09-08 DIAGNOSIS — R270 Ataxia, unspecified: Secondary | ICD-10-CM | POA: Diagnosis not present

## 2018-09-08 DIAGNOSIS — N170 Acute kidney failure with tubular necrosis: Secondary | ICD-10-CM | POA: Diagnosis not present

## 2018-09-08 DIAGNOSIS — D539 Nutritional anemia, unspecified: Secondary | ICD-10-CM | POA: Diagnosis not present

## 2018-09-08 LAB — VITAMIN B12: Vitamin B-12: 172 pg/mL — ABNORMAL LOW (ref 180–914)

## 2018-09-08 LAB — VALPROIC ACID LEVEL: Valproic Acid Lvl: 45 ug/mL — ABNORMAL LOW (ref 50.0–100.0)

## 2018-09-08 LAB — BASIC METABOLIC PANEL
Anion gap: 10 (ref 5–15)
BUN: 57 mg/dL — ABNORMAL HIGH (ref 8–23)
CO2: 16 mmol/L — ABNORMAL LOW (ref 22–32)
Calcium: 9 mg/dL (ref 8.9–10.3)
Chloride: 113 mmol/L — ABNORMAL HIGH (ref 98–111)
Creatinine, Ser: 2.44 mg/dL — ABNORMAL HIGH (ref 0.61–1.24)
GFR calc Af Amer: 29 mL/min — ABNORMAL LOW (ref >60)
GFR calc non Af Amer: 25 mL/min — ABNORMAL LOW (ref >60)
Glucose, Bld: 95 mg/dL (ref 70–99)
Potassium: 3.9 mmol/L (ref 3.5–5.1)
Sodium: 139 mmol/L (ref 135–145)

## 2018-09-08 LAB — HEPATIC FUNCTION PANEL
ALT: 11 U/L (ref 0–44)
AST: 13 U/L — ABNORMAL LOW (ref 15–41)
Albumin: 3 g/dL — ABNORMAL LOW (ref 3.5–5.0)
Alkaline Phosphatase: 61 U/L (ref 38–126)
Bilirubin, Direct: <0.1 mg/dL (ref 0.0–0.2)
Total Bilirubin: 0.5 mg/dL (ref 0.3–1.2)
Total Protein: 6.7 g/dL (ref 6.5–8.1)

## 2018-09-08 LAB — CBC WITH DIFFERENTIAL/PLATELET
Abs Immature Granulocytes: 0.14 10*3/uL — ABNORMAL HIGH (ref 0.00–0.07)
Basophils Absolute: 0 10*3/uL (ref 0.0–0.1)
Basophils Relative: 0 %
Eosinophils Absolute: 0.3 10*3/uL (ref 0.0–0.5)
Eosinophils Relative: 3 %
HCT: 33.6 % — ABNORMAL LOW (ref 39.0–52.0)
Hemoglobin: 10.7 g/dL — ABNORMAL LOW (ref 13.0–17.0)
Immature Granulocytes: 2 %
Lymphocytes Relative: 26 %
Lymphs Abs: 2.4 10*3/uL (ref 0.7–4.0)
MCH: 33.3 pg (ref 26.0–34.0)
MCHC: 31.8 g/dL (ref 30.0–36.0)
MCV: 104.7 fL — ABNORMAL HIGH (ref 80.0–100.0)
Monocytes Absolute: 0.8 10*3/uL (ref 0.1–1.0)
Monocytes Relative: 9 %
Neutro Abs: 5.4 10*3/uL (ref 1.7–7.7)
Neutrophils Relative %: 60 %
Platelets: 201 10*3/uL (ref 150–400)
RBC: 3.21 MIL/uL — ABNORMAL LOW (ref 4.22–5.81)
RDW: 14.2 % (ref 11.5–15.5)
WBC: 9 10*3/uL (ref 4.0–10.5)
nRBC: 0 % (ref 0.0–0.2)

## 2018-09-08 LAB — C-REACTIVE PROTEIN: CRP: <0.8 mg/dL (ref <1.0)

## 2018-09-08 LAB — RPR: RPR Ser Ql: NONREACTIVE

## 2018-09-08 LAB — HIV ANTIBODY (ROUTINE TESTING W REFLEX): HIV Screen 4th Generation wRfx: NONREACTIVE

## 2018-09-08 LAB — SEDIMENTATION RATE: Sed Rate: 33 mm/hr — ABNORMAL HIGH (ref 0–16)

## 2018-09-08 LAB — CK: Total CK: 82 U/L (ref 49–397)

## 2018-09-08 LAB — MAGNESIUM: Magnesium: 1.6 mg/dL — ABNORMAL LOW (ref 1.7–2.4)

## 2018-09-08 LAB — TSH: TSH: 0.744 u[IU]/mL (ref 0.350–4.500)

## 2018-09-08 LAB — CREATININE, URINE, RANDOM: Creatinine, Urine: 228.38 mg/dL

## 2018-09-08 MED ORDER — METOPROLOL SUCCINATE ER 25 MG PO TB24
25.0000 mg | ORAL_TABLET | Freq: Every day | ORAL | Status: DC
  Filled 2018-09-08: qty 1

## 2018-09-08 MED ORDER — SODIUM CHLORIDE 0.9 % IV SOLN
250.0000 mL | INTRAVENOUS | Status: DC | PRN
  Administered 2018-09-08: 250 mL via INTRAVENOUS

## 2018-09-08 MED ORDER — SODIUM CHLORIDE 0.45 % IV SOLN
INTRAVENOUS | Status: AC
  Administered 2018-09-08: 17:00:00 via INTRAVENOUS

## 2018-09-08 MED ORDER — MAGNESIUM SULFATE 2 GM/50ML IV SOLN
2.0000 g | Freq: Once | INTRAVENOUS | Status: AC
  Administered 2018-09-08: 2 g via INTRAVENOUS
  Filled 2018-09-08: qty 50

## 2018-09-08 MED ORDER — ISOSORBIDE MONONITRATE ER 30 MG PO TB24
30.0000 mg | ORAL_TABLET | Freq: Every day | ORAL | Status: DC
  Filled 2018-09-08: qty 1

## 2018-09-08 MED ORDER — PANTOPRAZOLE SODIUM 40 MG PO TBEC
40.0000 mg | DELAYED_RELEASE_TABLET | Freq: Every day | ORAL | Status: DC
  Administered 2018-09-08 – 2018-09-13 (×6): 40 mg via ORAL
  Filled 2018-09-08 (×6): qty 1

## 2018-09-08 MED ORDER — ONDANSETRON HCL 4 MG/2ML IJ SOLN: 4.0000 mg | Freq: Four times a day (QID) | INTRAMUSCULAR | Status: DC | PRN

## 2018-09-08 MED ORDER — MEGESTROL ACETATE 400 MG/10ML PO SUSP
1600.0000 mg | Freq: Every day | ORAL | Status: DC
  Administered 2018-09-08 – 2018-09-12 (×5): 1600 mg via ORAL
  Filled 2018-09-08 (×5): qty 40

## 2018-09-08 MED ORDER — ONDANSETRON HCL 4 MG PO TABS: 4.0000 mg | ORAL_TABLET | Freq: Four times a day (QID) | ORAL | Status: DC | PRN

## 2018-09-08 MED ORDER — ALLOPURINOL 100 MG PO TABS
100.0000 mg | ORAL_TABLET | Freq: Every day | ORAL | Status: DC
  Administered 2018-09-08 – 2018-09-13 (×6): 100 mg via ORAL
  Filled 2018-09-08 (×6): qty 1

## 2018-09-08 MED ORDER — HEPARIN SODIUM (PORCINE) 5000 UNIT/ML IJ SOLN
5000.0000 [IU] | Freq: Three times a day (TID) | INTRAMUSCULAR | Status: DC
  Administered 2018-09-08 – 2018-09-13 (×18): 5000 [IU] via SUBCUTANEOUS
  Filled 2018-09-08 (×17): qty 1

## 2018-09-08 MED ORDER — ENSURE ENLIVE PO LIQD
237.0000 mL | Freq: Two times a day (BID) | ORAL | Status: DC
  Administered 2018-09-08 – 2018-09-13 (×10): 237 mL via ORAL

## 2018-09-08 MED ORDER — CYANOCOBALAMIN 1000 MCG/ML IJ SOLN
1000.0000 ug | Freq: Once | INTRAMUSCULAR | Status: AC
  Administered 2018-09-08: 1000 ug via INTRAMUSCULAR
  Filled 2018-09-08: qty 1

## 2018-09-08 MED ORDER — HYDRALAZINE HCL 10 MG PO TABS
10.0000 mg | ORAL_TABLET | Freq: Two times a day (BID) | ORAL | Status: DC
  Filled 2018-09-08: qty 1

## 2018-09-08 MED ORDER — SODIUM CHLORIDE 0.9% FLUSH
3.0000 mL | Freq: Two times a day (BID) | INTRAVENOUS | Status: DC
  Administered 2018-09-08 – 2018-09-13 (×8): 3 mL via INTRAVENOUS

## 2018-09-08 MED ORDER — ACETAMINOPHEN 325 MG PO TABS: 650.0000 mg | ORAL_TABLET | Freq: Four times a day (QID) | ORAL | Status: DC | PRN

## 2018-09-08 MED ORDER — ACETAMINOPHEN 650 MG RE SUPP: 650.0000 mg | Freq: Four times a day (QID) | RECTAL | Status: DC | PRN

## 2018-09-08 MED ORDER — SODIUM CHLORIDE 0.9% FLUSH: 3.0000 mL | INTRAVENOUS | Status: DC | PRN

## 2018-09-08 MED ORDER — METOPROLOL TARTRATE 12.5 MG HALF TABLET
12.5000 mg | ORAL_TABLET | Freq: Two times a day (BID) | ORAL | Status: DC
  Administered 2018-09-08 – 2018-09-13 (×12): 12.5 mg via ORAL
  Filled 2018-09-08 (×12): qty 1

## 2018-09-08 MED ORDER — POLYETHYLENE GLYCOL 3350 17 G PO PACK
17.0000 g | PACK | Freq: Every day | ORAL | Status: DC | PRN
  Administered 2018-09-08: 17 g via ORAL
  Filled 2018-09-08: qty 1

## 2018-09-08 MED ORDER — SODIUM CHLORIDE 0.9% FLUSH
3.0000 mL | Freq: Two times a day (BID) | INTRAVENOUS | Status: DC
  Administered 2018-09-08 – 2018-09-13 (×11): 3 mL via INTRAVENOUS

## 2018-09-08 NOTE — Plan of Care (Signed)
Patient stable, discussed POC with patient, agreeable with plan, denies question/concerns at this time.  

## 2018-09-08 NOTE — Plan of Care (Signed)
  Problem: Health Behavior/Discharge Planning: Goal: Ability to manage health-related needs will improve Outcome: Progressing   Problem: Clinical Measurements: Goal: Ability to maintain clinical measurements within normal limits will improve Outcome: Progressing Goal: Will remain free from infection Outcome: Progressing Goal: Diagnostic test results will improve Outcome: Progressing Goal: Respiratory complications will improve Outcome: Progressing Goal: Cardiovascular complication will be avoided Outcome: Progressing   Ival Bible, BSN, RN

## 2018-09-08 NOTE — Evaluation (Signed)
Physical Therapy Evaluation Patient Details Name: Brent [name] MRN: [id number] DOB: [birth year] Today's Date: 09/08/2018   History of Present Illness  Patient is a 73 y/o male presenting to the ED on 09/07/2018 with primary complaints of general weakness and gait difficulty. Past medical history significant for chronic combined systolic and diastolic CHF, bipolar disorder, Crohn's disease on Remicade, chronic kidney disease stage III, and recent admission with acute CHF, discharged on 09/02/2018. Admitted for continued work-up.    Clinical Impression  Patient admitted with the above listed diagnosis. Patient reports independence with mobility and ADLs prior to admission, where he lives alone. Patient reports a fall yesterday as well as increased difficulty with mobility. Patient today requiring overall Min A for functional mobility with notes shuffle gait pattern with flexed trunk and instability. Orthostatics assessed - see below. Will currently recommend SNF at discharge to progress safe and independent functional mobility prior to return home. PT to follow acutely to progress safety, balance, strength, gait and mobility.   BP supine: 126/80, HR 96 BP sitting: 132/79, HR 101 BP standing: 109/75, HR 108 BP standing 3 min: 121/85, HR 109 *patient asymptomatic throughout    Follow Up Recommendations SNF    Equipment Recommendations  Other (comment)(defer)    Recommendations for Other Services       Precautions / Restrictions Precautions Precautions: Fall Precaution Comments: fell yesterday Restrictions Weight Bearing Restrictions: No      Mobility  Bed Mobility Overal bed mobility: Needs Assistance Bed Mobility: Supine to Sit     Supine to sit: Min assist     General bed mobility comments: light Min A to come to EOB; noted L lateral lean while unsupported sitting  Transfers Overall transfer level: Needs assistance Equipment used: Rolling walker (2 wheeled) Transfers: Sit  to/from [facility] Sit to Stand: Min assist Stand pivot transfers: Min assist       General transfer comment: Min A to stand from bedside - improved independence with higher seat height  Ambulation/Gait Ambulation/Gait assistance: Min assist Gait Distance (Feet): 30 Feet Assistive device: Rolling walker (2 wheeled) Gait Pattern/deviations: Step-to pattern;Decreased stride length;Shuffle;Narrow base of support;Trunk flexed Gait velocity: decreased   General Gait Details: flexed trunk throughout mobility; short shuffle steps with increased rate of stepping when trying to navigate obstacles; no freezing  Stairs            Wheelchair Mobility    Modified Rankin (Stroke Patients Only)       Balance Overall balance assessment: Needs assistance Sitting-balance support: Single extremity supported;Feet supported Sitting balance-Leahy Scale: Fair   Postural control: Left lateral lean Standing balance support: Bilateral upper extremity supported;During functional activity Standing balance-Leahy Scale: Poor                               Pertinent Vitals/Pain Pain Assessment: No/denies pain    Home Living Family/patient expects to be discharged to:: Private residence Living Arrangements: Alone Available Help at Discharge: Friend(s);Available PRN/intermittently Type of Home: Apartment Home Access: Stairs to enter Entrance Stairs-Rails: None Entrance Stairs-Number of Steps: 2 Home Layout: One level Home Equipment: Shower seat      Prior Function Level of Independence: Independent         Comments: drives     Hand Dominance        Extremity/Trunk Assessment   Upper Extremity Assessment Upper Extremity Assessment: Defer to OT evaluation    Lower Extremity Assessment Lower  Extremity Assessment: Generalized weakness    Cervical / Trunk Assessment Cervical / Trunk Assessment: Normal  Communication   Communication: No  difficulties  Cognition Arousal/Alertness: Awake/alert Behavior During Therapy: WFL for tasks assessed/performed Overall Cognitive Status: Within Functional Limits for tasks assessed                                        General Comments      Exercises     Assessment/Plan    PT Assessment Patient needs continued PT services  PT Problem List Decreased strength;Decreased activity tolerance;Decreased balance;Decreased mobility;Decreased knowledge of use of DME;Decreased safety awareness       PT Treatment Interventions DME instruction;Gait training;Functional mobility training;Therapeutic activities;Therapeutic exercise;Balance training;Patient/family education;Neuromuscular re-education    PT Goals (Current goals can be found in the Care Plan section)  Acute Rehab PT Goals Patient Stated Goal: regain strength and mobility PT Goal Formulation: With patient Time For Goal Achievement: 09/22/18 Potential to Achieve Goals: Good    Frequency Min 3X/week   Barriers to discharge        Co-evaluation               AM-PAC PT "6 Clicks" Mobility  Outcome Measure Help needed turning from your back to your side while in a flat bed without using bedrails?: A Little Help needed moving from lying on your back to sitting on the side of a flat bed without using bedrails?: A Little Help needed moving to and from a bed to a chair (including a wheelchair)?: A Little Help needed standing up from a chair using your arms (e.g., wheelchair or bedside chair)?: A Little Help needed to walk in hospital room?: A Little Help needed climbing 3-5 steps with a railing? : A Lot 6 Click Score: 17    End of Session Equipment Utilized During Treatment: Gait belt Activity Tolerance: Patient tolerated treatment well Patient left: in chair;with call bell/phone within reach;with chair alarm set Nurse Communication: Mobility status PT Visit Diagnosis: Unsteadiness on feet  (R26.81);Other abnormalities of gait and mobility (R26.89);Muscle weakness (generalized) (M62.81);History of falling (Z91.81)    Time: 1610-9604 PT Time Calculation (min) (ACUTE ONLY): 25 min   Charges:   PT Evaluation $PT Eval Moderate Complexity: 1 Mod PT Treatments $Gait Training: 8-22 mins        Lanney Gins, PT, DPT Supplemental Physical Therapist 09/08/18 11:30 AM Pager: [phone] Office: [phone]

## 2018-09-08 NOTE — Progress Notes (Signed)
Initial Nutrition Assessment  DOCUMENTATION CODES:   Not applicable  INTERVENTION:  Provide Ensure Enlive po BID, each supplement provides 350 kcal and 20 grams of protein.  Encourage adequate PO intake.   NUTRITION DIAGNOSIS:   Increased nutrient needs related to chronic illness(CHF) as evidenced by estimated needs.  GOAL:   Patient will meet greater than or equal to 90% of their needs  MONITOR:   PO intake, Supplement acceptance, Skin, Weight trends, Labs, I & O's  REASON FOR ASSESSMENT:   Malnutrition Screening Tool    ASSESSMENT:   73 y.o. male with medical history significant for chronic combined systolic and diastolic CHF, bipolar disorder, Crohn's disease on Remicade, chronic kidney disease stage III, and recent admission with acute CHF, discharged on 09/02/2018, and now presenting with progressive generalized weakness and gait difficulty.  Pt unavailable during attempted time of contact. Meal completion 100%. Weight has been fluctuating per weight records, likely related to fluid status. RD to order nutritional supplements to aid in increased caloric and protein needs.  Unable to complete Nutrition-Focused physical exam at this time.   Labs and medications reviewed.  Diet Order:   Diet Order            Diet Heart Room service appropriate? Yes; Fluid consistency: Thin  Diet effective now              EDUCATION NEEDS:   Not appropriate for education at this time  Skin:  Skin Assessment: Reviewed RN Assessment  Last BM:  7/15  Height:   Ht Readings from Last 1 Encounters:  09/08/18 6' 4"  (1.93 m)    Weight:   Wt Readings from Last 1 Encounters:  09/08/18 71 kg    Ideal Body Weight:  91.8 kg  BMI:  Body mass index is 19.05 kg/m.  Estimated Nutritional Needs:   Kcal:  2100-2300  Protein:  105-115 grams  Fluid:  2 L/day    Corrin Parker, MS, RD, LDN Pager # [phone] After hours/ weekend pager # [phone]

## 2018-09-08 NOTE — ED Notes (Signed)
ED TO INPATIENT HANDOFF REPORT  ED Nurse Name and Phone #: [phone] [name]  S Name/Age/Gender Brent [name] 73 y.o. male Room/Bed: 751Z/001V  Code Status   Code Status: Prior  Home/SNF/Other Home Patient oriented to: self, place, time and situation Is this baseline? Yes   Triage Complete: Triage complete  Chief Complaint General weakness  Triage Note Pt arrives to ED by sister from his PCP office. Pt was seen today as part of routine follow up after being in hospital. At clinic today patient had a resting heart rate of 115 and also reported progressive weakness that has been ongoing for over 1 week. Pt states he lives at home alone and it has been hard getting up and down.    Allergies Allergies  Allergen Reactions  . Azathioprine Other (See Comments)    REACTION: affected WBC  . Ciprofloxacin Other (See Comments)    Unknown rxn  . Levaquin [Levofloxacin In D5w] Other (See Comments)    Unknown rxn  . Plendil [Felodipine] Other (See Comments)    Unknown rxn    Level of Care/Admitting Diagnosis ED Disposition    ED Disposition Condition [facility] Area: [place] [100100]  Level of Care: Telemetry Medical [104]  I expect the patient will be discharged within 24 hours: Yes  LOW acuity---Tx typically complete <24 hrs---ACUTE conditions typically can be evaluated <24 hours---LABS likely to return to acceptable levels <24 hours---IS near functional baseline---EXPECTED to return to current living arrangement---NOT newly hypoxic: Does not meet criteria for 5C-Observation unit  Covid Evaluation: Asymptomatic Screening Protocol (No Symptoms)  Diagnosis: Ataxia [494496]  Admitting Physician: Vianne Bulls [[id number]]  Attending Physician: Vianne Bulls [[id number]]  PT Class (Do Not Modify): Observation [104]  PT Acc Code (Do Not Modify): Observation [10022]       B Medical/Surgery History Past Medical History:  Diagnosis Date  .  Anemia   . Anxiety   . Benign paroxysmal positional vertigo   . Bipolar I disorder, most recent episode (or current) unspecified   . Celiac disease   . Chronic kidney disease, stage III (moderate) (HCC)   . Crohn's    Remicade q8 weeks  . Depression   . Essential and other specified forms of tremor    medication-induced Parkinson's, now resolved  . Essential hypertension, benign   . Gout 2018  . Hypertrophy of prostate without urinary obstruction and other lower urinary tract symptoms (LUTS)   . Impotence of organic origin   . Insomnia with sleep apnea, unspecified   . Iron deficiency anemia, unspecified   . 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 Neuralgia, neuritis, and radiculitis, unspecified   . Other B-complex deficiencies   . Other extrapyramidal disease and abnormal movement disorder   . Postinflammatory pulmonary fibrosis ([place])   . Tobacco use disorder   . Vertigo 2018   Past Surgical History:  Procedure Laterality Date  . CHOLECYSTECTOMY  07-12-2010  . SMALL INTESTINE SURGERY     x 2     A IV Location/Drains/Wounds Patient Lines/Drains/Airways Status   Active Line/Drains/Airways    None          Intake/Output Last 24 hours No intake or output data in the 24 hours ending 09/08/18 0009  Labs/Imaging Results for orders placed or performed during the hospital encounter of 09/07/18 (from the past 48 hour(s))  Basic metabolic panel     Status: Abnormal   Collection Time: 09/07/18  5:08 PM  Result Value  Ref Range   Sodium 138 135 - 145 mmol/L   Potassium 4.1 3.5 - 5.1 mmol/L   Chloride 112 (H) 98 - 111 mmol/L   CO2 16 (L) 22 - 32 mmol/L   Glucose, Bld 106 (H) 70 - 99 mg/dL   BUN 59 (H) 8 - 23 mg/dL   Creatinine, Ser 2.76 (H) 0.61 - 1.24 mg/dL   Calcium 9.3 8.9 - 10.3 mg/dL   GFR calc non Af Amer 22 (L) >60 mL/min   GFR calc Af Amer 25 (L) >60 mL/min   Anion gap 10 5 - 15    Comment: Performed at [facility] [address], [place], [place] [address]  CBC      Status: Abnormal   Collection Time: 09/07/18  5:08 PM  Result Value Ref Range   WBC 10.8 (H) 4.0 - 10.5 K/uL   RBC 3.30 (L) 4.22 - 5.81 MIL/uL   Hemoglobin 11.0 (L) 13.0 - 17.0 g/dL   HCT 35.8 (L) 39.0 - 52.0 %   MCV 108.5 (H) 80.0 - 100.0 fL   MCH 33.3 26.0 - 34.0 pg   MCHC 30.7 30.0 - 36.0 g/dL   RDW 14.6 11.5 - 15.5 %   Platelets 237 150 - 400 K/uL   nRBC 0.0 0.0 - 0.2 %    Comment: Performed at [facility], [place] [address], [place], [place] [address]  Urinalysis, Routine w reflex microscopic     Status: None   Collection Time: 09/07/18  9:30 PM  Result Value Ref Range   Color, Urine YELLOW YELLOW   APPearance CLEAR CLEAR   Specific Gravity, Urine 1.020 1.005 - 1.030   pH 5.0 5.0 - 8.0   Glucose, UA NEGATIVE NEGATIVE mg/dL   Hgb urine dipstick NEGATIVE NEGATIVE   Bilirubin Urine NEGATIVE NEGATIVE   Ketones, ur NEGATIVE NEGATIVE mg/dL   Protein, ur NEGATIVE NEGATIVE mg/dL   Nitrite NEGATIVE NEGATIVE   Leukocytes,Ua NEGATIVE NEGATIVE    Comment: Performed at [facility] [address], [place], [place] [address]  SARS Coronavirus 2 (CEPHEID - Performed in [facility] hospital lab), Hosp Order     Status: None   Collection Time: 09/07/18 10:46 PM   Specimen: Nasopharyngeal Swab  Result Value Ref Range   SARS Coronavirus 2 NEGATIVE NEGATIVE    Comment: (NOTE) If result is NEGATIVE SARS-CoV-2 target nucleic acids are NOT DETECTED. The SARS-CoV-2 RNA is generally detectable in upper and lower  respiratory specimens during the acute phase of infection. The lowest  concentration of SARS-CoV-2 viral copies this assay can detect is 250  copies / mL. A negative result does not preclude SARS-CoV-2 infection  and should not be used as the sole basis for treatment or other  patient management decisions.  A negative result may occur with  improper specimen collection / handling, submission of specimen other  than nasopharyngeal swab, presence of viral mutation(s) within  the  areas targeted by this assay, and inadequate number of viral copies  (<250 copies / mL). A negative result must be combined with clinical  observations, patient history, and epidemiological information. If result is POSITIVE SARS-CoV-2 target nucleic acids are DETECTED. The SARS-CoV-2 RNA is generally detectable in upper and lower  respiratory specimens dur ing the acute phase of infection.  Positive  results are indicative of active infection with SARS-CoV-2.  Clinical  correlation with patient history and other diagnostic information is  necessary to determine patient infection status.  Positive results do  not rule out bacterial infection or co-infection with other viruses. If result is PRESUMPTIVE POSTIVE SARS-CoV-2 nucleic acids MAY BE PRESENT.   A presumptive positive result was obtained on the submitted specimen  and confirmed on repeat testing.  While 2019 novel coronavirus  (SARS-CoV-2) nucleic acids may be present in the submitted sample  additional confirmatory testing may be necessary for epidemiological  and / or clinical management purposes  to differentiate between  SARS-CoV-2 and other Sarbecovirus currently known to infect humans.  If clinically indicated additional testing with an alternate test  methodology [phone]) is advised. The SARS-CoV-2 RNA is generally  detectable in upper and lower respiratory sp ecimens during the acute  phase of infection. The expected result is Negative. Fact Sheet for Patients:  StrictlyIdeas.no Fact Sheet for Healthcare Providers: BankingDealers.co.za This test is not yet approved or cleared by the [place] FDA and has been authorized for detection and/or diagnosis of SARS-CoV-2 by FDA under an Emergency Use Authorization (EUA).  This EUA will remain in effect (meaning this test can be used) for the duration of the COVID-19 declaration under Section 564(b)(1) of the Act, 21  U.S.C. section 360bbb-3(b)(1), unless the authorization is terminated or revoked sooner. Performed at [facility], [place] [address], [place], [place] [address]    Dg Chest 2 View  Result Date: 09/07/2018 CLINICAL DATA:  Weakness for 1 day. Ex-smoker. EXAM: CHEST - 2 VIEW COMPARISON:  09/01/2018. FINDINGS: Normal sized heart. Mildly tortuous aorta. Clear lungs. Minimal peribronchial thickening with improvement. Mild thoracic spine degenerative changes. IMPRESSION: Minimal bronchitic changes with improvement. Electronically Signed   By: Claudie Revering M.D.   On: 09/07/2018 21:16   Ct Head Wo Contrast  Result Date: 09/07/2018 CLINICAL DATA:  Ataxia EXAM: CT HEAD WITHOUT CONTRAST TECHNIQUE: Contiguous axial images were obtained from the base of the skull through the vertex without intravenous contrast. COMPARISON:  02/07/2018 FINDINGS: Brain: Age related volume loss. No acute intracranial abnormality. Specifically, no hemorrhage, hydrocephalus, mass lesion, acute infarction, or significant intracranial injury. Vascular: No hyperdense vessel or unexpected calcification. Skull: No acute calvarial abnormality. Sinuses/Orbits: Mucosal thickening in the left maxillary sinus. No acute findings. Other: None IMPRESSION: No acute intracranial abnormality. Electronically Signed   By: Rolm Baptise M.D.   On: 09/07/2018 21:55    Pending Labs Unresulted Labs (From admission, onward)    Start     Ordered   09/07/18 2357  Creatinine, urine, random  Add-on,   AD     09/07/18 2356   09/07/18 2357  Urea nitrogen, urine  Add-on,   AD     09/07/18 2356   09/07/18 2348  Valproic acid level  Once,   STAT     09/07/18 2347   09/07/18 2346  TSH  Once,   STAT     09/07/18 2345   09/07/18 2346  CK  Once,   STAT     09/07/18 2345   09/07/18 2346  Vitamin B12  Once,   STAT     09/07/18 2345   09/07/18 2346  Folate RBC  Once,   STAT     09/07/18 2345   09/07/18 2346  Sedimentation rate  Once,   STAT     09/07/18  2345   09/07/18 2346  C-reactive protein  Once,   STAT     09/07/18 2345   09/07/18 2346  Hepatic function panel  Once,   STAT     09/07/18 2345  09/07/18 2346  Magnesium  Once,   STAT     09/07/18 2345   09/07/18 2346  RPR  Once,   STAT     09/07/18 2345   09/07/18 2346  HIV Antibody (routine testing w rflx)  Once,   STAT     09/07/18 2345   Signed and Held  Basic metabolic panel  Tomorrow morning,   R     Signed and Held   Signed and Held  CBC WITH DIFFERENTIAL  Tomorrow morning,   R     Signed and Held          Vitals/Pain Today's Vitals   09/07/18 1704 09/07/18 1930 09/07/18 2030 09/07/18 2045  BP:  123/75 115/86 121/78  Pulse:  (!) 120 (!) 109 (!) 109  Resp:  16  18  Temp:      TempSrc:      SpO2:  100% 97% 98%  PainSc: 0-No pain       Isolation Precautions No active isolations  Medications Medications  sodium chloride flush (NS) 0.9 % injection 3 mL (has no administration in time range)  metoprolol succinate (TOPROL-XL) 24 hr tablet 12.5 mg (has no administration in time range)  sodium bicarbonate tablet 650 mg (has no administration in time range)    Mobility walks with device     Focused Assessments Cardiac Assessment Handoff:  Cardiac Rhythm: Normal sinus rhythm Lab Results  Component Value Date   TROPONINI <0.03 05/05/2018   No results found for: DDIMER Does the Patient currently have chest pain? No     R Recommendations: See Admitting Provider Note  Report given to:   Additional Notes: Pt is A&O x4.  Denies any pain.

## 2018-09-08 NOTE — Care Management Obs Status (Signed)
[place] NOTIFICATION   Patient Details  Name: Brent [name] MRN: [id number] Date of Birth: May 11, 1945   Medicare Observation Status Notification Given:  Yes    Pollie Friar, RN 09/08/2018, 4:12 PM

## 2018-09-08 NOTE — NC FL2 (Signed)
[place] LEVEL OF CARE SCREENING TOOL     IDENTIFICATION  Patient Name: Brent [name] Birthdate: 1945-06-09 Sex: male Admission Date (Current Location): 09/07/2018  [facility] and [place] Number:  Herbalist and Address:  [facility]. [facility], [place] [address], [place], [place] [address]      Provider Number: [id number]  Attending Physician Name and Address:  Geradine Girt, DO  Relative Name and Phone Number:       Current Level of Care: Hospital Recommended Level of Care: [place] Prior Approval Number:    Date Approved/Denied:   PASRR Number:    Discharge Plan: SNF    Current Diagnoses: Patient Active Problem List   Diagnosis Date Noted  . Ataxia 09/07/2018  . Metabolic acidosis 41/42/3953  . Orthostasis 09/07/2018  . AKI (acute kidney injury) ([place])   . Immunosuppressed status ([place])   . Macrocytic anemia   . Acute on chronic systolic (congestive) heart failure ([place]) 08/31/2018  . Acute on chronic combined systolic (congestive) and diastolic (congestive) heart failure ([place]) 08/31/2018  . Chronic combined systolic and diastolic congestive heart failure ([place]) 06/03/2018  . Acute systolic CHF (congestive heart failure) ([place])   . Acute respiratory failure with hypoxia ([place]) 05/05/2018  . Multifocal pneumonia 05/05/2018  . Acute pulmonary edema ([place]) 05/05/2018  . Candida esophagitis ([place]) 09/22/2017  . History of smoking 30 or more pack years 01/05/2017  . Bipolar 1 disorder ([place])   . BPH (benign prostatic hyperplasia) 03/31/2013  . 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 Essential hypertension, benign 03/31/2013  . Acute renal failure superimposed on stage 3 chronic kidney disease ([place]) 06/04/2012  . 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[place]) 01/23/2010    Orientation RESPIRATION BLADDER Height & Weight     Self, Time, Situation, Place  Normal Continent Weight: 71 kg Height:  6' 4"  (193 cm)  BEHAVIORAL  SYMPTOMS/MOOD NEUROLOGICAL BOWEL NUTRITION STATUS      Continent Diet(heart healthy)  AMBULATORY STATUS COMMUNICATION OF NEEDS Skin   Limited Assist Verbally Normal                       Personal Care Assistance Level of Assistance  Bathing, Dressing, Feeding Bathing Assistance: Limited assistance Feeding assistance: Independent Dressing Assistance: Limited assistance     Functional Limitations Info  Sight, Hearing, Speech Sight Info: Impaired Hearing Info: Adequate Speech Info: Adequate    SPECIAL CARE FACTORS FREQUENCY  PT (By licensed PT), OT (By licensed OT)     PT Frequency: 5x/wk OT Frequency: 5x/wk            Contractures Contractures Info: Not present    Additional Factors Info  Code Status, Allergies, Psychotropic Code Status Info: DNR Allergies Info: Cipofloxacin/ Levaquin/ Plendil/ Azathioprine Psychotropic Info: At home was on: Depakote EC 1552m at bedtime and Zyprexa 7.5 mg at bedtime         Current Medications (09/08/2018):  This is the current hospital active medication list Current Facility-Administered Medications  Medication Dose Route Frequency Provider Last Rate Last Dose  . 0.45 % sodium chloride infusion   Intravenous Continuous Black, KLezlie Octave NP      . 0.9 %  sodium chloride infusion  250 mL Intravenous PRN Opyd, TIlene Qua MD 10 mL/hr at 09/08/18 1432 250 mL at 09/08/18 1432  . acetaminophen (TYLENOL) tablet 650 mg  650 mg Oral Q6H PRN Opyd, TIlene Qua MD  Or  . acetaminophen (TYLENOL) suppository 650 mg  650 mg Rectal Q6H PRN Opyd, Ilene Qua, MD      . allopurinol (ZYLOPRIM) tablet 100 mg  100 mg Oral Daily Opyd, Ilene Qua, MD   100 mg at 09/08/18 1100  . feeding supplement (ENSURE ENLIVE) (ENSURE ENLIVE) liquid 237 mL  237 mL Oral BID BM Opyd, Ilene Qua, MD      . heparin injection 5,000 Units  5,000 Units Subcutaneous Q8H Opyd, Ilene Qua, MD   5,000 Units at 09/08/18 0521  . megestrol (MEGACE) 400 MG/10ML suspension 1,600 mg   1,600 mg Oral Daily Opyd, Ilene Qua, MD   1,600 mg at 09/08/18 1100  . metoprolol tartrate (LOPRESSOR) tablet 12.5 mg  12.5 mg Oral BID Dyanne Carrel M, NP   12.5 mg at 09/08/18 1100  . ondansetron (ZOFRAN) tablet 4 mg  4 mg Oral Q6H PRN Opyd, Ilene Qua, MD       Or  . ondansetron (ZOFRAN) injection 4 mg  4 mg Intravenous Q6H PRN Opyd, Ilene Qua, MD      . pantoprazole (PROTONIX) EC tablet 40 mg  40 mg Oral Daily Opyd, Ilene Qua, MD   40 mg at 09/08/18 0936  . polyethylene glycol (MIRALAX / GLYCOLAX) packet 17 g  17 g Oral Daily PRN Opyd, Ilene Qua, MD   17 g at 09/08/18 0643  . sodium bicarbonate tablet 650 mg  650 mg Oral TID Vianne Bulls, MD   650 mg at 09/08/18 0936  . sodium chloride flush (NS) 0.9 % injection 3 mL  3 mL Intravenous Once Opyd, Ilene Qua, MD      . sodium chloride flush (NS) 0.9 % injection 3 mL  3 mL Intravenous Q12H Opyd, Ilene Qua, MD   3 mL at 09/08/18 0937  . sodium chloride flush (NS) 0.9 % injection 3 mL  3 mL Intravenous Q12H Opyd, Ilene Qua, MD   3 mL at 09/08/18 0937  . sodium chloride flush (NS) 0.9 % injection 3 mL  3 mL Intravenous PRN Opyd, Ilene Qua, MD         Discharge Medications: Please see discharge summary for a list of discharge medications.  Relevant Imaging Results:  Relevant Lab Results:   Additional Information SS#: [id number]  Pollie Friar, RN

## 2018-09-08 NOTE — TOC Initial Note (Signed)
Transition of Care [facility]) - Initial/Assessment Note    Patient Details  Name: Brent [name] MRN: [id number] Date of Birth: Feb 11, 1946  Transition of [facility]) CM/SW Contact:    Pollie Friar, RN Phone Number: 09/08/2018, 11:32 AM  Clinical Narrative:                 Pt has been driving himself prior to hospitalization. He has a sister: Assunta Curtis that he says can provide assistance. He also has a friend Elta Guadeloupe that would help him as needed.  Pt denies issues with home medications.  Currently the recommendation is for SNF. TOC will discuss with patient.   Expected Discharge Plan: Skilled Nursing Facility Barriers to Discharge: Continued Medical Work up   Patient Goals and CMS Choice        Expected Discharge Plan and Services Expected Discharge Plan: [place] In-house Referral: Clinical Social Work Discharge Planning Services: CM Consult Post Acute Care Choice: [place] Living arrangements for the past 2 months: Apartment(ground level)                                      Prior Living Arrangements/Services Living arrangements for the past 2 months: Apartment(ground level) Lives with:: Self Patient language and need for interpreter reviewed:: Yes(no needs) Do you feel safe going back to the place where you live?: Yes      Need for Family Participation in Patient Care: Yes (Comment) Care giver support system in place?: Yes (comment)(states he can stay with sister if needed)   Criminal Activity/Legal Involvement Pertinent to Current Situation/Hospitalization: No - Comment as needed  Activities of Daily Living Home Assistive Devices/Equipment: Eyeglasses, Blood pressure cuff, Scales ADL Screening (condition at time of admission) Patient's cognitive ability adequate to safely complete daily activities?: Yes Is the patient deaf or have difficulty hearing?: No Does the patient have difficulty seeing, even when wearing  glasses/contacts?: No Does the patient have difficulty concentrating, remembering, or making decisions?: No Patient able to express need for assistance with ADLs?: Yes Does the patient have difficulty dressing or bathing?: No Independently performs ADLs?: Yes (appropriate for developmental age) Does the patient have difficulty walking or climbing stairs?: Yes Weakness of Legs: Both Weakness of Arms/Hands: None  Permission Sought/Granted                  Emotional Assessment Appearance:: Appears stated age Attitude/Demeanor/Rapport: Engaged Affect (typically observed): Accepting, Pleasant Orientation: : Oriented to Self, Oriented to Place, Oriented to  Time   Psych Involvement: No (comment)  Admission diagnosis:  Ataxia [R27.0] Patient Active Problem List   Diagnosis Date Noted  . Ataxia 09/07/2018  . Metabolic acidosis 38/18/2993  . Orthostasis 09/07/2018  . AKI (acute kidney injury) ([place])   . Immunosuppressed status ([place])   . Macrocytic anemia   . Acute on chronic systolic (congestive) heart failure ([place]) 08/31/2018  . Acute on chronic combined systolic (congestive) and diastolic (congestive) heart failure ([place]) 08/31/2018  . Chronic combined systolic and diastolic congestive heart failure ([place]) 06/03/2018  . Acute systolic CHF (congestive heart failure) ([place])   . Acute respiratory failure with hypoxia ([place]) 05/05/2018  . Multifocal pneumonia 05/05/2018  . Acute pulmonary edema ([place]) 05/05/2018  . Candida esophagitis ([place]) 09/22/2017  . History of smoking 30 or more pack years 01/05/2017  . Bipolar 1 disorder ([place])   . BPH (benign prostatic hyperplasia) 03/31/2013  .  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 Essential hypertension, benign 03/31/2013  . Acute renal failure superimposed on stage 3 chronic kidney disease ([place]) 06/04/2012  . 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 [place] [address] Phone: [phone] Fax: [phone]  [facility] [phone] - [place], [place] - [address] [place] [place] [address] Phone: [phone] Fax: [phone]     Social Determinants of Health (SDOH) Interventions    Readmission Risk Interventions No flowsheet data found.

## 2018-09-08 NOTE — Progress Notes (Addendum)
[facility] PROGRESS NOTE  Brent [name] TKW:[id number] DOB: 1945-06-22 DOA: 09/07/2018 PCP: Gayland Curry, DO  Assessment/Plan:  1. Acute kidney injury superimposed on CKD III; acidosis  - pre-renal azotemia in setting of poor appetite, recent diuresis in hospital.  creatinine trending down slightly.  -hold nephrotoxins -monitor urine output -consider very gentle IV fluids (EF 30%) -recheck   2. Chronic combined systolic & diastolic CHF  Appears compensated or even a little dry. EF 30%. Home meds include lasix, BB, imdur.  - Hold Lasix -daily weight  -monitor intake and output.     4. Macrocytic anemia  Stable, no bleeding, managed outpatient with iron and Procrit     5. Orthostasis 20 mmHg drop in SBP upon standing in ED. BP remains somewhat soft this am. Home meds include norvasc, lasix, imdur and metoprolol.  -hold lasix -gentle fluids -hold imdur, norvasc  -lower BB dose -monitor closely     6. Bipolar disorder  - Stable, hold potentially sedating medications initially     7. Crohn's disease stable. Continue home med  8. Ataxia, lethargy - Presented with ~1 wk of progressive lethargy, generalized weakness, and shuffling gait.  Head CT is negative for acute findings and there is no focal numbness of weakness on exam.  magnesium 1.6, RPR in progress, valproic acid level 45, sed rate 33, B12 172, CK and TSH within limits of normal. 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 No Cogwheel -mag supplement -B12 IM x1 -placement    Code Status: dnr Family Communication: sister on phone Disposition Plan: snf   Consultants:   Procedures:   Antibiotics:   HPI/Subjective: Up in chair. Reports feeling better this am.   Objective: Vitals:   09/08/18 0340 09/08/18 0805  BP: (!) 135/95 113/75  Pulse: (!) 111 (!) 103  Resp: 19 20  Temp: 98.1 F (36.7 C) 98.4 F (36.9 C)  SpO2: 100% 100%    Intake/Output Summary (Last 24 hours) at  09/08/2018 1405 Last data filed at 09/08/2018 1142 Gross per 24 hour  Intake 240 ml  Output --  Net 240 ml   Filed Weights   09/08/18 0122  Weight: 71 kg    Exam:  General:  Tall thin no acute distress Cardiovascular: rrr no mgr no LE edema Respiratory: normal effort BS clear bilaterally no wheeze Abdomen: non-distended non-tender  Musculoskeletal: joints without swelling/erythema   Data Reviewed: Basic Metabolic Panel: Recent Labs  Lab 09/02/18 0514 09/07/18 1708 09/08/18 0248  NA 139 138 139  K 4.0 4.1 3.9  CL 110 112* 113*  CO2 15* 16* 16*  GLUCOSE 84 106* 95  BUN 67* 59* 57*  CREATININE 2.46* 2.76* 2.44*  CALCIUM 7.6* 9.3 9.0  MG 1.0*  --  1.6*   Liver Function Tests: Recent Labs  Lab 09/02/18 0514 09/08/18 0248  AST 21 13*  ALT 20 11  ALKPHOS 70 61  BILITOT 0.2* 0.5  PROT 7.6 6.7  ALBUMIN 3.6 3.0*   No results for input(s): LIPASE, AMYLASE in the last 168 hours. No results for input(s): AMMONIA in the last 168 hours. CBC: Recent Labs  Lab 09/02/18 0514 09/07/18 1028 09/07/18 1708 09/08/18 0248  WBC 11.0*  --  10.8* 9.0  NEUTROABS 6.6  --   --  5.4  HGB 10.9* 11.3* 11.0* 10.7*  HCT 35.3* 36.2* 35.8* 33.6*  MCV 106.3*  --  108.5* 104.7*  PLT 247  --  237 201   Cardiac Enzymes: Recent Labs  Lab 09/08/18 0248  CKTOTAL 82   BNP (last 3 results) Recent Labs    05/05/18 0442 05/12/18 0629 08/31/18 0134  BNP 490.9* 1,145.7* 1,257.9*    ProBNP (last 3 results) No results for input(s): PROBNP in the last 8760 hours.  CBG: No results for input(s): GLUCAP in the last 168 hours.  Recent Results (from the past 240 hour(s))  SARS Coronavirus 2 (CEPHEID- Performed in [facility] hospital lab), Hosp Order     Status: None   Collection Time: 08/31/18  1:34 AM   Specimen: Nasopharyngeal Swab  Result Value Ref Range Status   SARS Coronavirus 2 NEGATIVE NEGATIVE Final    Comment: (NOTE) If result is NEGATIVE SARS-CoV-2 target nucleic acids  are NOT DETECTED. The SARS-CoV-2 RNA is generally detectable in upper and lower  respiratory specimens during the acute phase of infection. The lowest  concentration of SARS-CoV-2 viral copies this assay can detect is 250  copies / mL. A negative result does not preclude SARS-CoV-2 infection  and should not be used as the sole basis for treatment or other  patient management decisions.  A negative result may occur with  improper specimen collection / handling, submission of specimen other  than nasopharyngeal swab, presence of viral mutation(s) within the  areas targeted by this assay, and inadequate number of viral copies  (<250 copies / mL). A negative result must be combined with clinical  observations, patient history, and epidemiological information. If result is POSITIVE SARS-CoV-2 target nucleic acids are DETECTED. The SARS-CoV-2 RNA is generally detectable in upper and lower  respiratory specimens dur ing the acute phase of infection.  Positive  results are indicative of active infection with SARS-CoV-2.  Clinical  correlation with patient history and other diagnostic information is  necessary to determine patient infection status.  Positive results do  not rule out bacterial infection or co-infection with other viruses. If result is PRESUMPTIVE POSTIVE SARS-CoV-2 nucleic acids MAY BE PRESENT.   A presumptive positive result was obtained on the submitted specimen  and confirmed on repeat testing.  While 2019 novel coronavirus  (SARS-CoV-2) nucleic acids may be present in the submitted sample  additional confirmatory testing may be necessary for epidemiological  and / or clinical management purposes  to differentiate between  SARS-CoV-2 and other Sarbecovirus currently known to infect humans.  If clinically indicated additional testing with an alternate test  methodology [id number]) is advised. The SARS-CoV-2 RNA is generally  detectable in upper and lower respiratory sp ecimens  during the acute  phase of infection. The expected result is Negative. Fact Sheet for Patients:  StrictlyIdeas.no Fact Sheet for Healthcare Providers: BankingDealers.co.za This test is not yet approved or cleared by the [place] FDA and has been authorized for detection and/or diagnosis of SARS-CoV-2 by FDA under an Emergency Use Authorization (EUA).  This EUA will remain in effect (meaning this test can be used) for the duration of the COVID-19 declaration under Section 564(b)(1) of the Act, 21 U.S.C. section 360bbb-3(b)(1), unless the authorization is terminated or revoked sooner. Performed at [facility], [place] [address] [place], [place] [address]   SARS Coronavirus 2 (CEPHEID - Performed in [facility] hospital lab), Hosp Order     Status: None   Collection Time: 09/07/18 10:46 PM   Specimen: Nasopharyngeal Swab  Result Value Ref Range Status   SARS Coronavirus 2 NEGATIVE NEGATIVE Final    Comment: (NOTE) If result is NEGATIVE SARS-CoV-2 target nucleic acids are NOT DETECTED. The  SARS-CoV-2 RNA is generally detectable in upper and lower  respiratory specimens during the acute phase of infection. The lowest  concentration of SARS-CoV-2 viral copies this assay can detect is 250  copies / mL. A negative result does not preclude SARS-CoV-2 infection  and should not be used as the sole basis for treatment or other  patient management decisions.  A negative result may occur with  improper specimen collection / handling, submission of specimen other  than nasopharyngeal swab, presence of viral mutation(s) within the  areas targeted by this assay, and inadequate number of viral copies  (<250 copies / mL). A negative result must be combined with clinical  observations, patient history, and epidemiological information. If result is POSITIVE SARS-CoV-2 target nucleic acids are DETECTED. The SARS-CoV-2 RNA is  generally detectable in upper and lower  respiratory specimens dur ing the acute phase of infection.  Positive  results are indicative of active infection with SARS-CoV-2.  Clinical  correlation with patient history and other diagnostic information is  necessary to determine patient infection status.  Positive results do  not rule out bacterial infection or co-infection with other viruses. If result is PRESUMPTIVE POSTIVE SARS-CoV-2 nucleic acids MAY BE PRESENT.   A presumptive positive result was obtained on the submitted specimen  and confirmed on repeat testing.  While 2019 novel coronavirus  (SARS-CoV-2) nucleic acids may be present in the submitted sample  additional confirmatory testing may be necessary for epidemiological  and / or clinical management purposes  to differentiate between  SARS-CoV-2 and other Sarbecovirus currently known to infect humans.  If clinically indicated additional testing with an alternate test  methodology [phone]) is advised. The SARS-CoV-2 RNA is generally  detectable in upper and lower respiratory sp ecimens during the acute  phase of infection. The expected result is Negative. Fact Sheet for Patients:  StrictlyIdeas.no Fact Sheet for Healthcare Providers: BankingDealers.co.za This test is not yet approved or cleared by the [place] FDA and has been authorized for detection and/or diagnosis of SARS-CoV-2 by FDA under an Emergency Use Authorization (EUA).  This EUA will remain in effect (meaning this test can be used) for the duration of the COVID-19 declaration under Section 564(b)(1) of the Act, 21 U.S.C. section 360bbb-3(b)(1), unless the authorization is terminated or revoked sooner. Performed at [facility], [place] [address] [place], [place] [address]      Studies: Dg Chest 2 View  Result Date: 09/07/2018 CLINICAL DATA:  Weakness for 1 day. Ex-smoker. EXAM: CHEST - 2 VIEW  COMPARISON:  09/01/2018. FINDINGS: Normal sized heart. Mildly tortuous aorta. Clear lungs. Minimal peribronchial thickening with improvement. Mild thoracic spine degenerative changes. IMPRESSION: Minimal bronchitic changes with improvement. Electronically Signed   By: Claudie Revering M.D.   On: 09/07/2018 21:16   Ct Head Wo Contrast  Result Date: 09/07/2018 CLINICAL DATA:  Ataxia EXAM: CT HEAD WITHOUT CONTRAST TECHNIQUE: Contiguous axial images were obtained from the base of the skull through the vertex without intravenous contrast. COMPARISON:  02/07/2018 FINDINGS: Brain: Age related volume loss. No acute intracranial abnormality. Specifically, no hemorrhage, hydrocephalus, mass lesion, acute infarction, or significant intracranial injury. Vascular: No hyperdense vessel or unexpected calcification. Skull: No acute calvarial abnormality. Sinuses/Orbits: Mucosal thickening in the left maxillary sinus. No acute findings. Other: None IMPRESSION: No acute intracranial abnormality. Electronically Signed   By: Rolm Baptise M.D.   On: 09/07/2018 21:55    Scheduled Meds:  allopurinol  100 mg Oral Daily   feeding supplement (ENSURE  ENLIVE)  237 mL Oral BID BM   heparin  5,000 Units Subcutaneous Q8H   megestrol  1,600 mg Oral Daily   metoprolol tartrate  12.5 mg Oral BID   pantoprazole  40 mg Oral Daily   sodium bicarbonate  650 mg Oral TID   sodium chloride flush  3 mL Intravenous Once   sodium chloride flush  3 mL Intravenous Q12H   sodium chloride flush  3 mL Intravenous Q12H   Continuous Infusions:  sodium chloride     magnesium sulfate bolus IVPB Stopped (09/08/18 1225)    Principal Problem:   Acute renal failure superimposed on stage 3 chronic kidney disease (HCC) Active Problems:   Chronic combined systolic and diastolic congestive heart failure (HCC)   Metabolic acidosis   Orthostasis   Macrocytic anemia   Ataxia   Bipolar 1 disorder ([place])    Time spent: 66 minutes    [facility] [address] M  NP  [facility]  If 7PM-7AM, please contact night-coverage at [email or web address], password [facility] 09/08/2018, 2:05 PM  LOS: 0 days

## 2018-09-09 ENCOUNTER — Ambulatory Visit: Payer: Self-pay | Admitting: *Deleted

## 2018-09-09 DIAGNOSIS — N183 Chronic kidney disease, stage 3 (moderate): Secondary | ICD-10-CM | POA: Diagnosis not present

## 2018-09-09 DIAGNOSIS — N170 Acute kidney failure with tubular necrosis: Secondary | ICD-10-CM | POA: Diagnosis not present

## 2018-09-09 LAB — COMPREHENSIVE METABOLIC PANEL
ALT: 10 U/L (ref 0–44)
AST: 12 U/L — ABNORMAL LOW (ref 15–41)
Albumin: 2.7 g/dL — ABNORMAL LOW (ref 3.5–5.0)
Alkaline Phosphatase: 50 U/L (ref 38–126)
Anion gap: 8 (ref 5–15)
BUN: 48 mg/dL — ABNORMAL HIGH (ref 8–23)
CO2: 18 mmol/L — ABNORMAL LOW (ref 22–32)
Calcium: 8.7 mg/dL — ABNORMAL LOW (ref 8.9–10.3)
Chloride: 111 mmol/L (ref 98–111)
Creatinine, Ser: 2.09 mg/dL — ABNORMAL HIGH (ref 0.61–1.24)
GFR calc Af Amer: 35 mL/min — ABNORMAL LOW (ref >60)
GFR calc non Af Amer: 30 mL/min — ABNORMAL LOW (ref >60)
Glucose, Bld: 102 mg/dL — ABNORMAL HIGH (ref 70–99)
Potassium: 4.4 mmol/L (ref 3.5–5.1)
Sodium: 137 mmol/L (ref 135–145)
Total Bilirubin: 0.4 mg/dL (ref 0.3–1.2)
Total Protein: 6.3 g/dL — ABNORMAL LOW (ref 6.5–8.1)

## 2018-09-09 LAB — SARS CORONAVIRUS 2 BY RT PCR (HOSPITAL ORDER, PERFORMED IN ~~LOC~~ HOSPITAL LAB): SARS Coronavirus 2: NEGATIVE

## 2018-09-09 LAB — GLUCOSE, CAPILLARY: Glucose-Capillary: 90 mg/dL (ref 70–99)

## 2018-09-09 LAB — UREA NITROGEN, URINE: Urea Nitrogen, Ur: 1211 mg/dL

## 2018-09-09 LAB — FOLATE RBC
Folate, Hemolysate: 280 ng/mL
Folate, RBC: 875 ng/mL (ref >498)
Hematocrit: 32 % — ABNORMAL LOW (ref 37.5–51.0)

## 2018-09-09 NOTE — TOC Progression Note (Signed)
Transition of Care [facility]) - Progression Note    Patient Details  Name: Brent [name] MRN: [id number] Date of Birth: [birth year]  Transition [facility]) CM/SW Contact  Pollie Friar, RN Phone Number: 09/09/2018, 2:15 PM  Clinical Narrative:    Pt and sister selected [facility] for SNF rehab. Olivia Mackie at [facility] is aware and is going to start insurance auth. Pts PASAR went to level II.  TOC following for potential d/c to [facility] tomorrow if auth back and pt medically ready.    Expected Discharge Plan: Skilled Nursing Facility Barriers to Discharge: Ship broker, [place] Forensic scientist)  Expected Discharge Plan and Services Expected Discharge Plan: [place] In-house Referral: Clinical Social Work Discharge Planning Services: CM Consult Post Acute Care Choice: [place] Living arrangements for the past 2 months: Apartment(ground level)                                       Social Determinants of Health (SDOH) Interventions    Readmission Risk Interventions No flowsheet data found.

## 2018-09-09 NOTE — Plan of Care (Signed)
  Problem: Health Behavior/Discharge Planning: Goal: Ability to manage health-related needs will improve Outcome: Progressing   Problem: Clinical Measurements: Goal: Ability to maintain clinical measurements within normal limits will improve Outcome: Progressing Goal: Will remain free from infection Outcome: Progressing Goal: Diagnostic test results will improve Outcome: Progressing Goal: Respiratory complications will improve Outcome: Progressing Goal: Cardiovascular complication will be avoided Outcome: Progressing   Ival Bible, BSN, RN

## 2018-09-09 NOTE — Progress Notes (Signed)
PROGRESS NOTE    Brent [name]  MCN:[id number] DOB: 31-Jul-1945 DOA: 09/07/2018 PCP: Gayland Curry, DO    Brief Narrative:  73 year old male with history of chronic combined systolic and diastolic congestive heart failure, known ejection fraction of 30%, bipolar disorder, Crohn's disease on Remicade and chronic kidney disease stage III with baseline creatinine about 1.7, recent hospitalization for acute CHF was discharged home came back with generalized weakness and difficulty walking.  With significant symptoms and some orthostatic changes he was admitted to the hospital.  Patient is on heart failure regimen with diuretics and was found with positive orthostatic changes and clinical dehydration.  In the emergency room he was afebrile, he was on room air, blood pressures were stable on laying down.  EKG was normal.  Creatinine was 2.76 up from baseline of 1.7.  CT head normal.   Assessment & Plan:   Principal Problem:   Acute renal failure superimposed on stage 3 chronic kidney disease (HCC) Active Problems:   Bipolar 1 disorder (HCC)   Chronic combined systolic and diastolic congestive heart failure (HCC)   Macrocytic anemia   Ataxia   Metabolic acidosis   Orthostasis  AKI superimposed on stage III CKD: Prerenal azotemia in the setting of poor appetite, diuresis and low blood pressure.  Good urine output.  Diuretics on hold.  Renal functions improving and normalizing.  Will recheck tomorrow morning.  Chronic combined heart failure: Well compensated and euvolemic.  Diuretics and nitrates on hold.  Continue beta-blockers.  His weight has been stable.  We will continue to hold diuretics for next few days.  Will avoid IV fluids.  Profound physical debility: With recent hospitalization and acute medical problems.  He will benefit with inpatient therapies and has been referred to skilled nursing home.  Macrocytic anemia: Stable.  Crohn's disease: Stable.  Ataxia/lethargy: Mostly due to  advanced debility.  CT head was normal.  Electrolytes are normal.  Valproate level 45, B12, CK and TSH within normal limits.  He will benefit with inpatient therapies.   DVT prophylaxis: Heparin subcu Code Status: DNR Family Communication: None Disposition Plan: Skilled nursing facility.  Anticipate tomorrow morning.   Consultants:   None  Procedures:   None  Antimicrobials:   None   Subjective: Patient seen and examined.  He denied any complaints.  He was working with occupational therapist in the morning and he was very positive about improvement of his ambulation.  No more orthostatic.  No dizziness on getting up.  He is looking forward to get rehab and go back to driving and going around.  Objective: Vitals:   09/08/18 2003 09/09/18 0028 09/09/18 0318 09/09/18 0806  BP: 115/79 111/85 116/77 116/72  Pulse: (!) 102 93 91 95  Resp: 18 18 17 18   Temp: 99.1 F (37.3 C) 98.8 F (37.1 C) 98.4 F (36.9 C) 98.3 F (36.8 C)  TempSrc: Oral Oral Oral Oral  SpO2: 100% 100% 100% 100%  Weight:   67.1 kg   Height:        Intake/Output Summary (Last 24 hours) at 09/09/2018 1221 Last data filed at 09/09/2018 0801 Gross per 24 hour  Intake 184.41 ml  Output 450 ml  Net -265.59 ml   Filed Weights   09/08/18 0122 09/09/18 0318  Weight: 71 kg 67.1 kg    Examination:  General exam: Appears calm and comfortable  Respiratory system: Clear to auscultation. Respiratory effort normal. Cardiovascular system: S1 & S2 heard, RRR. No JVD, murmurs, rubs, gallops  or clicks. No pedal edema. Gastrointestinal system: Abdomen is nondistended, soft and nontender. No organomegaly or masses felt. Normal bowel sounds heard. Central nervous system: Alert and oriented. No focal neurological deficits. Extremities: Symmetric 5 x 5 power. Skin: No rashes, lesions or ulcers Psychiatry: Judgement and insight appear normal. Mood & affect appropriate.     Data Reviewed: I have personally reviewed  following labs and imaging studies  CBC: Recent Labs  Lab 09/07/18 1028 09/07/18 1708 09/08/18 0248  WBC  --  10.8* 9.0  NEUTROABS  --   --  5.4  HGB 11.3* 11.0* 10.7*  HCT 36.2* 35.8* 33.6*  MCV  --  108.5* 104.7*  PLT  --  237 947   Basic Metabolic Panel: Recent Labs  Lab 09/07/18 1708 09/08/18 0248 09/09/18 0443  NA 138 139 137  K 4.1 3.9 4.4  CL 112* 113* 111  CO2 16* 16* 18*  GLUCOSE 106* 95 102*  BUN 59* 57* 48*  CREATININE 2.76* 2.44* 2.09*  CALCIUM 9.3 9.0 8.7*  MG  --  1.6*  --    GFR: Estimated Creatinine Clearance: 29.9 mL/min (A) (by C-G formula based on SCr of 2.09 mg/dL (H)). Liver Function Tests: Recent Labs  Lab 09/08/18 0248 09/09/18 0443  AST 13* 12*  ALT 11 10  ALKPHOS 61 50  BILITOT 0.5 0.4  PROT 6.7 6.3*  ALBUMIN 3.0* 2.7*   No results for input(s): LIPASE, AMYLASE in the last 168 hours. No results for input(s): AMMONIA in the last 168 hours. Coagulation Profile: No results for input(s): INR, PROTIME in the last 168 hours. Cardiac Enzymes: Recent Labs  Lab 09/08/18 0248  CKTOTAL 82   BNP (last 3 results) No results for input(s): PROBNP in the last 8760 hours. HbA1C: No results for input(s): HGBA1C in the last 72 hours. CBG: Recent Labs  Lab 09/09/18 0616  GLUCAP 90   Lipid Profile: No results for input(s): CHOL, HDL, LDLCALC, TRIG, CHOLHDL, LDLDIRECT in the last 72 hours. Thyroid Function Tests: Recent Labs    09/08/18 0248  TSH 0.744   Anemia Panel: Recent Labs    09/07/18 1028 09/08/18 0248  VITAMINB12  --  172*  FERRITIN 793*  --   TIBC 261  --   IRON 58  --    Sepsis Labs: No results for input(s): PROCALCITON, LATICACIDVEN in the last 168 hours.  Recent Results (from the past 240 hour(s))  SARS Coronavirus 2 (CEPHEID- Performed in [facility] hospital lab), Hosp Order     Status: None   Collection Time: 08/31/18  1:34 AM   Specimen: Nasopharyngeal Swab  Result Value Ref Range Status   SARS Coronavirus  2 NEGATIVE NEGATIVE Final    Comment: (NOTE) If result is NEGATIVE SARS-CoV-2 target nucleic acids are NOT DETECTED. The SARS-CoV-2 RNA is generally detectable in upper and lower  respiratory specimens during the acute phase of infection. The lowest  concentration of SARS-CoV-2 viral copies this assay can detect is 250  copies / mL. A negative result does not preclude SARS-CoV-2 infection  and should not be used as the sole basis for treatment or other  patient management decisions.  A negative result may occur with  improper specimen collection / handling, submission of specimen other  than nasopharyngeal swab, presence of viral mutation(s) within the  areas targeted by this assay, and inadequate number of viral copies  (<250 copies / mL). A negative result must be combined with clinical  observations, patient history, and epidemiological  information. If result is POSITIVE SARS-CoV-2 target nucleic acids are DETECTED. The SARS-CoV-2 RNA is generally detectable in upper and lower  respiratory specimens dur ing the acute phase of infection.  Positive  results are indicative of active infection with SARS-CoV-2.  Clinical  correlation with patient history and other diagnostic information is  necessary to determine patient infection status.  Positive results do  not rule out bacterial infection or co-infection with other viruses. If result is PRESUMPTIVE POSTIVE SARS-CoV-2 nucleic acids MAY BE PRESENT.   A presumptive positive result was obtained on the submitted specimen  and confirmed on repeat testing.  While 2019 novel coronavirus  (SARS-CoV-2) nucleic acids may be present in the submitted sample  additional confirmatory testing may be necessary for epidemiological  and / or clinical management purposes  to differentiate between  SARS-CoV-2 and other Sarbecovirus currently known to infect humans.  If clinically indicated additional testing with an alternate test  methodology  [phone]) is advised. The SARS-CoV-2 RNA is generally  detectable in upper and lower respiratory sp ecimens during the acute  phase of infection. The expected result is Negative. Fact Sheet for Patients:  StrictlyIdeas.no Fact Sheet for Healthcare Providers: BankingDealers.co.za This test is not yet approved or cleared by the [place] FDA and has been authorized for detection and/or diagnosis of SARS-CoV-2 by FDA under an Emergency Use Authorization (EUA).  This EUA will remain in effect (meaning this test can be used) for the duration of the COVID-19 declaration under Section 564(b)(1) of the Act, 21 U.S.C. section 360bbb-3(b)(1), unless the authorization is terminated or revoked sooner. Performed at [facility], [place] [address] [place], [place] [address]   SARS Coronavirus 2 (CEPHEID - Performed in [facility] hospital lab), Hosp Order     Status: None   Collection Time: 09/07/18 10:46 PM   Specimen: Nasopharyngeal Swab  Result Value Ref Range Status   SARS Coronavirus 2 NEGATIVE NEGATIVE Final    Comment: (NOTE) If result is NEGATIVE SARS-CoV-2 target nucleic acids are NOT DETECTED. The SARS-CoV-2 RNA is generally detectable in upper and lower  respiratory specimens during the acute phase of infection. The lowest  concentration of SARS-CoV-2 viral copies this assay can detect is 250  copies / mL. A negative result does not preclude SARS-CoV-2 infection  and should not be used as the sole basis for treatment or other  patient management decisions.  A negative result may occur with  improper specimen collection / handling, submission of specimen other  than nasopharyngeal swab, presence of viral mutation(s) within the  areas targeted by this assay, and inadequate number of viral copies  (<250 copies / mL). A negative result must be combined with clinical  observations, patient history, and epidemiological  information. If result is POSITIVE SARS-CoV-2 target nucleic acids are DETECTED. The SARS-CoV-2 RNA is generally detectable in upper and lower  respiratory specimens dur ing the acute phase of infection.  Positive  results are indicative of active infection with SARS-CoV-2.  Clinical  correlation with patient history and other diagnostic information is  necessary to determine patient infection status.  Positive results do  not rule out bacterial infection or co-infection with other viruses. If result is PRESUMPTIVE POSTIVE SARS-CoV-2 nucleic acids MAY BE PRESENT.   A presumptive positive result was obtained on the submitted specimen  and confirmed on repeat testing.  While 2019 novel coronavirus  (SARS-CoV-2) nucleic acids may be present in the submitted sample  additional confirmatory testing may be necessary  for epidemiological  and / or clinical management purposes  to differentiate between  SARS-CoV-2 and other Sarbecovirus currently known to infect humans.  If clinically indicated additional testing with an alternate test  methodology [phone]) is advised. The SARS-CoV-2 RNA is generally  detectable in upper and lower respiratory sp ecimens during the acute  phase of infection. The expected result is Negative. Fact Sheet for Patients:  StrictlyIdeas.no Fact Sheet for Healthcare Providers: BankingDealers.co.za This test is not yet approved or cleared by the [place] FDA and has been authorized for detection and/or diagnosis of SARS-CoV-2 by FDA under an Emergency Use Authorization (EUA).  This EUA will remain in effect (meaning this test can be used) for the duration of the COVID-19 declaration under Section 564(b)(1) of the Act, 21 U.S.C. section 360bbb-3(b)(1), unless the authorization is terminated or revoked sooner. Performed at [facility], [place] [address], [place], [place] [address]          Radiology  Studies: Dg Chest 2 View  Result Date: 09/07/2018 CLINICAL DATA:  Weakness for 1 day. Ex-smoker. EXAM: CHEST - 2 VIEW COMPARISON:  09/01/2018. FINDINGS: Normal sized heart. Mildly tortuous aorta. Clear lungs. Minimal peribronchial thickening with improvement. Mild thoracic spine degenerative changes. IMPRESSION: Minimal bronchitic changes with improvement. Electronically Signed   By: Claudie Revering M.D.   On: 09/07/2018 21:16   Ct Head Wo Contrast  Result Date: 09/07/2018 CLINICAL DATA:  Ataxia EXAM: CT HEAD WITHOUT CONTRAST TECHNIQUE: Contiguous axial images were obtained from the base of the skull through the vertex without intravenous contrast. COMPARISON:  02/07/2018 FINDINGS: Brain: Age related volume loss. No acute intracranial abnormality. Specifically, no hemorrhage, hydrocephalus, mass lesion, acute infarction, or significant intracranial injury. Vascular: No hyperdense vessel or unexpected calcification. Skull: No acute calvarial abnormality. Sinuses/Orbits: Mucosal thickening in the left maxillary sinus. No acute findings. Other: None IMPRESSION: No acute intracranial abnormality. Electronically Signed   By: Rolm Baptise M.D.   On: 09/07/2018 21:55        Scheduled Meds:  allopurinol  100 mg Oral Daily   feeding supplement (ENSURE ENLIVE)  237 mL Oral BID BM   heparin  5,000 Units Subcutaneous Q8H   megestrol  1,600 mg Oral Daily   metoprolol tartrate  12.5 mg Oral BID   pantoprazole  40 mg Oral Daily   sodium bicarbonate  650 mg Oral TID   sodium chloride flush  3 mL Intravenous Once   sodium chloride flush  3 mL Intravenous Q12H   sodium chloride flush  3 mL Intravenous Q12H   Continuous Infusions:  sodium chloride 10 mL/hr at 09/08/18 1623     LOS: 0 days    Time spent: 25 minutes    Barb Merino, MD [facility] Pager [phone]  If 7PM-7AM, please contact night-coverage [email or web address] Password [facility] 09/09/2018, 12:21 PM

## 2018-09-09 NOTE — Progress Notes (Signed)
Physical Therapy Treatment Patient Details Name: Brent [name] MRN: [id number] DOB: 28-Dec-1945 Today's Date: 09/09/2018    History of Present Illness Patient is a 73 y/o male presenting to the ED on 09/07/2018 with primary complaints of general weakness and gait difficulty. Past medical history significant for chronic combined systolic and diastolic CHF, bipolar disorder, Crohn's disease on Remicade, chronic kidney disease stage III, and recent admission with acute CHF, discharged on 09/02/2018. Admitted for continued work-up.    PT Comments    Patient seen for mobility progression. Intermittent shuffling gait noted with mobility, patient also stating he felt as if he was leaning towards the L. Min A throughout mobility for safety and stability. Will continue to recommend SNF at discharge.    Follow Up Recommendations  SNF     Equipment Recommendations  Other (comment)(TBD)    Recommendations for Other Services       Precautions / Restrictions Precautions Precautions: Fall Precaution Comments: fall history Restrictions Weight Bearing Restrictions: No    Mobility  Bed Mobility Overal bed mobility: Needs Assistance Bed Mobility: Sit to Supine     Supine to sit: Min guard Sit to supine: Min guard   General bed mobility comments: min guard for safety  Transfers Overall transfer level: Needs assistance Equipment used: Rolling walker (2 wheeled) Transfers: Sit to/from [facility] Sit to Stand: Min assist Stand pivot transfers: Min assist       General transfer comment: sit to stand from bedside with light Min A for initial steadying  Ambulation/Gait Ambulation/Gait assistance: Min assist Gait Distance (Feet): 80 Feet Assistive device: Rolling walker (2 wheeled) Gait Pattern/deviations: Step-to pattern;Step-through pattern;Shuffle;Trunk flexed Gait velocity: decreased   General Gait Details: patient reports feeling as if he is leaning towards his left;  up to MIn A for safety and stability with gait; intermittent periods of shuffling   Stairs             Wheelchair Mobility    Modified Rankin (Stroke Patients Only)       Balance Overall balance assessment: Needs assistance Sitting-balance support: Single extremity supported;Feet supported Sitting balance-Leahy Scale: Fair     Standing balance support: Bilateral upper extremity supported;During functional activity Standing balance-Leahy Scale: Poor Standing balance comment: reliant on RW                            Cognition Arousal/Alertness: Awake/alert Behavior During Therapy: WFL for tasks assessed/performed Overall Cognitive Status: Within Functional Limits for tasks assessed                                        Exercises      General Comments General comments (skin integrity, edema, etc.): Pt performing Short Blessed Test scoring 20/28 consistent with impairment conssited with dementia requiring further evaluation from neurology. Pt stating "I can't do that" when asked to count backwards from 20-1 and to say the months of the year in reverse. Pt with cues was able to complete task with increased cues for motivation.      Pertinent Vitals/Pain Pain Assessment: No/denies pain    Home Living Family/patient expects to be discharged to:: Private residence Living Arrangements: Alone Available Help at Discharge: Friend(s);Available PRN/intermittently Type of Home: Apartment Home Access: Stairs to enter Entrance Stairs-Rails: None Home Layout: One level Home Equipment: Civil engineer, contracting  Prior Function Level of Independence: Independent      Comments: drives   PT Goals (current goals can now be found in the care plan section) Acute Rehab PT Goals Patient Stated Goal: increase strength PT Goal Formulation: With patient Time For Goal Achievement: 09/22/18 Potential to Achieve Goals: Good Progress towards PT goals: Progressing  toward goals    Frequency    Min 3X/week      PT Plan Current plan remains appropriate    Co-evaluation              AM-PAC PT "6 Clicks" Mobility   Outcome Measure  Help needed turning from your back to your side while in a flat bed without using bedrails?: A Little Help needed moving from lying on your back to sitting on the side of a flat bed without using bedrails?: A Little Help needed moving to and from a bed to a chair (including a wheelchair)?: A Little Help needed standing up from a chair using your arms (e.g., wheelchair or bedside chair)?: A Little Help needed to walk in hospital room?: A Little Help needed climbing 3-5 steps with a railing? : A Lot 6 Click Score: 17    End of Session Equipment Utilized During Treatment: Gait belt Activity Tolerance: Patient tolerated treatment well Patient left: in bed;with call bell/phone within reach;with bed alarm set Nurse Communication: Mobility status PT Visit Diagnosis: Unsteadiness on feet (R26.81);Other abnormalities of gait and mobility (R26.89);Muscle weakness (generalized) (M62.81);History of falling (Z91.81)     Time: 1245-8099 PT Time Calculation (min) (ACUTE ONLY): 12 min  Charges:  $Gait Training: 8-22 mins                      Lanney Gins, PT, DPT Supplemental Physical Therapist 09/09/18 10:40 AM Pager: [phone] Office: [phone]

## 2018-09-09 NOTE — Evaluation (Signed)
Occupational Therapy Evaluation Patient Details Name: Brent [name] MRN: [id number] DOB: 1946/01/29 Today's Date: 09/09/2018    History of Present Illness Patient is a 73 y/o male presenting to the ED on 09/07/2018 with primary complaints of general weakness and gait difficulty. Past medical history significant for chronic combined systolic and diastolic CHF, bipolar disorder, Crohn's disease on Remicade, chronic kidney disease stage III, and recent admission with acute CHF, discharged on 09/02/2018. Admitted for continued work-up.   Clinical Impression   Pt PTA: pt was living alone and independent with ADL and mobility. Pt currenty performing ADL functional mobility with RW and shuffled steps. Pt ambulating in room from bed to bathroom with minguardA to minA. Pt performing grooming at sink in standing and after 2 mins requires to be seated for task. Pt set-upA to modA for ADL. Pt with current deficits in mobility, strength, cognition- recall and higher level executive and decreased ability to care for self. Pt performing Short Blessed Test scoring 20/28 consistent with impairment conssited with dementia requiring further evaluation from neurology. Pt stating "I can't do that" when asked to count backwards from 20-1 and to say the months of the year in reverse. Pt with cues was able to complete task with increased cues for motivation, but with many errors. Immediate recall is WFLs, but recall after a few mins was poor. ** This is concerning as pt reports independence with medication management PTA and now with very poor memory. Pt would greatly benefit from continued OT skilled services for ADL, mobility and safety in SNF setting. OT following acutely.    Follow Up Recommendations  SNF    Equipment Recommendations  None recommended by OT    Recommendations for Other Services       Precautions / Restrictions Precautions Precautions: Fall Precaution Comments: fall history Restrictions Weight  Bearing Restrictions: No      Mobility Bed Mobility Overal bed mobility: Needs Assistance Bed Mobility: Supine to Sit     Supine to sit: Min guard     General bed mobility comments: unsupported sitting  Transfers Overall transfer level: Needs assistance Equipment used: Rolling walker (2 wheeled) Transfers: Sit to/from [facility] Sit to Stand: Min assist Stand pivot transfers: Min assist       General transfer comment: Pt sit to stand from bed with minA and performs much better with sit to stand from [facility] over commode.    Balance Overall balance assessment: Needs assistance Sitting-balance support: Single extremity supported;Feet supported Sitting balance-Leahy Scale: Fair     Standing balance support: Bilateral upper extremity supported;During functional activity Standing balance-Leahy Scale: Poor Standing balance comment: pt sat at EOB holding onto RW                           ADL either performed or assessed with clinical judgement   ADL Overall ADL's : Needs assistance/impaired Eating/Feeding: Modified independent;Sitting   Grooming: Set up;Sitting   Upper Body Bathing: Set up;Sitting;Cueing for safety   Lower Body Bathing: Moderate assistance;Sitting/lateral leans;Sit to/from stand   Upper Body Dressing : Set up;Sitting   Lower Body Dressing: Moderate assistance;Sitting/lateral leans;Sit to/from stand   Toilet Transfer: Minimal assistance;Ambulation;Comfort height toilet;Grab bars   Toileting- Clothing Manipulation and Hygiene: Moderate assistance;Cueing for safety;Sitting/lateral lean;Sit to/from stand       Functional mobility during ADLs: Minimal assistance;Rolling walker;Cueing for safety;Cueing for sequencing General ADL Comments: Pt feels sore from recent fall. Pt reports that he prefers  gonig to SNF to increase independence level. Pt currently very fatigued after exertion.     Vision Baseline Vision/History: Wears  glasses Wears Glasses: At all times Vision Assessment?: No apparent visual deficits     Perception     Praxis      Pertinent Vitals/Pain Pain Assessment: No/denies pain     Hand Dominance     Extremity/Trunk Assessment Upper Extremity Assessment Upper Extremity Assessment: Generalized weakness   Lower Extremity Assessment Lower Extremity Assessment: Generalized weakness   Cervical / Trunk Assessment Cervical / Trunk Assessment: Normal   Communication Communication Communication: No difficulties   Cognition Arousal/Alertness: Awake/alert Behavior During Therapy: WFL for tasks assessed/performed Overall Cognitive Status: Within Functional Limits for tasks assessed                                     General Comments  Pt performing Short Blessed Test scoring 20/28 consistent with impairment conssited with dementia requiring further evaluation from neurology. Pt stating "I can't do that" when asked to count backwards from 20-1 and to say the months of the year in reverse. Pt with cues was able to complete task with increased cues for motivation.    Exercises     Shoulder Instructions      Home Living Family/patient expects to be discharged to:: Private residence Living Arrangements: Alone Available Help at Discharge: Friend(s);Available PRN/intermittently Type of Home: Apartment Home Access: Stairs to enter Entrance Stairs-Number of Steps: 2 Entrance Stairs-Rails: None Home Layout: One level     Bathroom Shower/Tub: Teacher, early years/pre: Handicapped height     Home Equipment: Shower seat          Prior Functioning/Environment Level of Independence: Independent        Comments: drives        OT Problem List: Decreased strength;Decreased activity tolerance;Impaired balance (sitting and/or standing);Decreased coordination;Decreased cognition;Decreased safety awareness      OT Treatment/Interventions: Self-care/ADL  training;Therapeutic exercise;Energy conservation;Neuromuscular education;Therapeutic activities;Patient/family education;Balance training    OT Goals(Current goals can be found in the care plan section) Acute Rehab OT Goals Patient Stated Goal: increase strength OT Goal Formulation: With patient Time For Goal Achievement: 09/23/18 Potential to Achieve Goals: Good ADL Goals Pt Will Perform Grooming: with modified independence;standing Pt Will Perform Upper Body Dressing: with modified independence;standing Pt Will Perform Lower Body Dressing: with modified independence;sit to/from stand Pt Will Perform Toileting - Clothing Manipulation and hygiene: with modified independence;sitting/lateral leans;sit to/from stand Additional ADL Goal #1: Pt will perform higher level executive functioning tasks x10 mins of sustained attention with supervision level and minimal cueing.  OT Frequency: Min 2X/week   Barriers to D/C: Decreased caregiver support          Co-evaluation              AM-PAC OT "6 Clicks" Daily Activity     Outcome Measure Help from another person eating meals?: None Help from another person taking care of personal grooming?: A Little Help from another person toileting, which includes using toliet, bedpan, or urinal?: A Lot Help from another person bathing (including washing, rinsing, drying)?: A Lot Help from another person to put on and taking off regular upper body clothing?: A Little Help from another person to put on and taking off regular lower body clothing?: A Lot 6 Click Score: 16   End of Session Equipment Utilized During Treatment: Gait belt;Rolling walker  Nurse Communication: Mobility status  Activity Tolerance: Patient tolerated treatment well Patient left: in chair;with call bell/phone within reach;with chair alarm set  OT Visit Diagnosis: Muscle weakness (generalized) (M62.81);Other symptoms and signs involving cognitive function                Time:  3662-9476 OT Time Calculation (min): 33 min Charges:  OT General Charges $OT Visit: 1 Visit OT Evaluation $OT Eval Moderate Complexity: 1 Mod OT Treatments $Self Care/Home Management : 8-22 mins  Ebony Hail Harold Hedge) Marsa Aris OTR/L Acute Rehabilitation Services Pager: [phone] Office: [place] 09/09/2018, 9:20 AM

## 2018-09-10 LAB — GLUCOSE, CAPILLARY: Glucose-Capillary: 89 mg/dL (ref 70–99)

## 2018-09-10 NOTE — Discharge Summary (Signed)
Physician Discharge Summary  Brent [name] OEV:[id number] DOB: 21-May-1945 DOA: 09/07/2018  PCP: Gayland Curry, DO  Admit date: 09/07/2018 Discharge date: 09/10/2018  Admitted From: Home Disposition: Skilled nursing facility  Recommendations for Outpatient Follow-up:  1. Follow up with PCP in 1-2 weeks 2. Please obtain BMP/CBC in one week   Home Health: Not applicable Equipment/Devices: Not applicable  Discharge Condition: Stable CODE STATUS: DNR Diet recommendation: Low-salt diet  Brief/Interim Summary: 73 year old male with history of chronic combined systolic and diastolic congestive heart failure, known ejection fraction of 30%, bipolar disorder, Crohn's disease on Remicade and chronic kidney disease stage III with baseline creatinine about 1.7, recent hospitalization for acute CHF was discharged home came back with generalized weakness and difficulty walking.  With significant symptoms and some orthostatic changes he was admitted to the hospital.  Patient is on heart failure regimen with diuretics and was found with positive orthostatic changes and clinical dehydration.  In the emergency room he was afebrile, he was on room air, blood pressures were stable on laying down.  EKG was normal.  Creatinine was 2.76 up from baseline of 1.7.  CT head normal.   Discharge Diagnoses:  Principal Problem:   Acute renal failure superimposed on stage 3 chronic kidney disease (HCC) Active Problems:   Bipolar 1 disorder (HCC)   Chronic combined systolic and diastolic congestive heart failure (HCC)   Macrocytic anemia   Ataxia   Metabolic acidosis   Orthostasis  AKI superimposed on stage III CKD: Prerenal azotemia in the setting of poor appetite, diuresis and low blood pressure.  Good urine output.  Diuretics were hold.  Renal functions improving and normalizing.   Chronic combined heart failure: Well compensated and euvolemic.  Diuretics and nitrates were on hold. Will resume. His weight has  been stable.  Profound physical debility: With recent hospitalization and acute medical problems.  He will benefit with inpatient therapies and has been referred to skilled nursing home.  Macrocytic anemia: Stable.  Crohn's disease: Stable.  Ataxia/lethargy: Mostly due to advanced debility.  CT head was normal.  Electrolytes are normal.  Valproate level 45, B12, CK and TSH within normal limits.  He will benefit with inpatient therapies.  Patient is medically stable to transfer to skilled level of care to continue inpatient therapies.  Discharge Instructions  Discharge Instructions    Diet - low sodium heart healthy   Complete by: As directed    Increase activity slowly   Complete by: As directed      Allergies as of 09/10/2018      Reactions   Azathioprine Other (See Comments)   REACTION: affected WBC   Ciprofloxacin Other (See Comments)   Unknown rxn   Levaquin [levofloxacin In D5w] Other (See Comments)   Unknown rxn   Plendil [felodipine] Other (See Comments)   Unknown rxn      Medication List    TAKE these medications   allopurinol 100 MG tablet Commonly known as: ZYLOPRIM Take 2 tablets (200 mg total) by mouth daily. What changed: how much to take   cholecalciferol 1000 units tablet Commonly known as: VITAMIN D Take 1,000 Units by mouth daily.   divalproex 500 MG DR tablet Commonly known as: DEPAKOTE Take 1,500 mg by mouth at bedtime.   ferrous sulfate 325 (65 FE) MG tablet Take 325 mg by mouth every Monday, Wednesday, and Friday.   furosemide 20 MG tablet Commonly known as: LASIX Take 1 tablet (20 mg total) by mouth 3 (three) times  a week. Monday Wednesday and Friday What changed:   when to take this  additional instructions   hydrALAZINE 10 MG tablet Commonly known as: APRESOLINE Take 1 tablet (10 mg total) by mouth 2 (two) times a day.   inFLIXimab 100 MG injection Commonly known as: Remicade Infuse Remicade IV schedule 1 68m/kg every 8  weeks Premedicate with Tylenol 500-6574mby mouth and Benadryl 25-5071my mouth prior to infusion. Last PPD was on 12/2009.   isosorbide mononitrate 30 MG 24 hr tablet Commonly known as: IMDUR Take 1 tablet (30 mg total) by mouth daily.   loperamide 2 MG capsule Commonly known as: IMODIUM Take 4 mg by mouth daily as needed for diarrhea or loose stools.   magnesium gluconate 30 MG tablet Commonly known as: MAGONATE Take 1 tablet (30 mg total) by mouth every Monday, Wednesday, and Friday.   megestrol 40 MG/ML suspension Commonly known as: MEGACE Take 40 mLs by mouth daily.   metoprolol succinate 25 MG 24 hr tablet Commonly known as: Toprol XL Take 0.5 tablets (12.5 mg total) by mouth at bedtime. What changed: Another medication with the same name was removed. Continue taking this medication, and follow the directions you see here.   OLANZapine 7.5 MG tablet Commonly known as: ZyPREXA Take 1 tablet (7.5 mg total) by mouth at bedtime.   omeprazole 20 MG capsule Commonly known as: PRILOSEC Take 20 mg by mouth daily before supper.   polyethylene glycol 17 g packet Commonly known as: MIRALAX / GLYCOLAX Take 17 g by mouth daily as needed for mild constipation.   Procrit 10000 UNIT/ML injection Generic drug: epoetin alfa Inject 10,000 Units into the skin every 30 (thirty) days.      Contact information for after-discharge care    Destination    [facility] .   Service: Skilled Nursing [address]: [phone]          Allergies  Allergen Reactions  . Azathioprine Other (See Comments)    REACTION: affected WBC  . Ciprofloxacin Other (See Comments)    Unknown rxn  . Levaquin [Levofloxacin In D5w] Other (See Comments)    Unknown rxn  . Plendil [Felodipine] Other (See Comments)    Unknown rxn    Consultations:  None   Procedures/Studies: Dg Chest 2 View  Result Date:  09/07/2018 CLINICAL DATA:  Weakness for 1 day. Ex-smoker. EXAM: CHEST - 2 VIEW COMPARISON:  09/01/2018. FINDINGS: Normal sized heart. Mildly tortuous aorta. Clear lungs. Minimal peribronchial thickening with improvement. Mild thoracic spine degenerative changes. IMPRESSION: Minimal bronchitic changes with improvement. Electronically Signed   By: SteClaudie ReveringD.   On: 09/07/2018 21:16   Dg Chest 2 View  Result Date: 09/01/2018 CLINICAL DATA:  Shortness of breath history of Crohn's disease EXAM: CHEST - 2 VIEW COMPARISON:  August 31, 2018 FINDINGS: The heart size and mediastinal contours are stable. Both lungs are clear. The visualized skeletal structures are unremarkable. IMPRESSION: No active cardiopulmonary disease. Electronically Signed   By: WeiAbelardo DieselD.   On: 09/01/2018 08:25   Dg Chest 2 View  Result Date: 08/21/2018 CLINICAL DATA:  Shortness of breath and wheezing EXAM: CHEST - 2 VIEW COMPARISON:  May 12, 2018 FINDINGS: There is slight scarring in the right upper lobe. There is no evident edema or consolidation. Heart is upper normal in size with pulmonary vascularity normal. No adenopathy. There is degenerative change in the thoracic spine.  IMPRESSION: Slight scarring right upper lobe. No edema or consolidation. Heart upper normal in size. Electronically Signed   By: Lowella Grip III M.D.   On: 08/21/2018 09:58   Ct Head Wo Contrast  Result Date: 09/07/2018 CLINICAL DATA:  Ataxia EXAM: CT HEAD WITHOUT CONTRAST TECHNIQUE: Contiguous axial images were obtained from the base of the skull through the vertex without intravenous contrast. COMPARISON:  02/07/2018 FINDINGS: Brain: Age related volume loss. No acute intracranial abnormality. Specifically, no hemorrhage, hydrocephalus, mass lesion, acute infarction, or significant intracranial injury. Vascular: No hyperdense vessel or unexpected calcification. Skull: No acute calvarial abnormality. Sinuses/Orbits: Mucosal thickening in the left  maxillary sinus. No acute findings. Other: None IMPRESSION: No acute intracranial abnormality. Electronically Signed   By: Rolm Baptise M.D.   On: 09/07/2018 21:55   Ct Chest Wo Contrast  Result Date: 08/31/2018 CLINICAL DATA:  Shortness of breath. Volume overload with heart failure suspected EXAM: CT CHEST WITHOUT CONTRAST TECHNIQUE: Multidetector CT imaging of the chest was performed following the standard protocol without IV contrast. COMPARISON:  Chest x-ray from earlier today and chest CT 05/05/2018 FINDINGS: Cardiovascular: Generous heart size with trace pericardial fluid and/or thickening. Mild coronary calcification. Mediastinum/Nodes: Circumferential prominent esophageal wall thickness but stable and generalized. This thickening is less extensive than seen in 2018. Enlarged lower right periesophageal lymph node that is stable since 2018 at least, presumably reactive and related to the esophagus. Lungs/Pleura: Trace pleural fluid on the right more than left with mild atelectasis. Subpleural reticulation in the upper lobes is scarring when correlated with 2018 CT. Mild emphysema. There is no edema, consolidation, or pneumothorax. Upper Abdomen: Bilateral renal cystic densities. There is cholecystectomy which accounts for prominent bile duct diameter. Musculoskeletal: Degenerative changes without acute finding. IMPRESSION: 1. Trace pleural effusions with atelectasis.  No pulmonary edema. 2. Chronic generalized esophageal wall thickening with mildly enlarged periesophageal lymph node, please correlate for esophagitis symptoms. Electronically Signed   By: Monte Fantasia M.D.   On: 08/31/2018 13:35   Dg Chest Port 1 View  Result Date: 08/31/2018 CLINICAL DATA:  Shortness of breath and wheezing EXAM: PORTABLE CHEST 1 VIEW COMPARISON:  Chest x-ray dated August 20, 2018 FINDINGS: The heart size is enlarged. There are new ground-glass airspace opacities bilaterally. There is no pneumothorax. No large pleural  effusion. There is no acute osseous abnormality. IMPRESSION: Cardiomegaly with findings suspicious for developing pulmonary edema. An atypical infectious process can have a similar appearance in the appropriate clinical setting. Electronically Signed   By: Constance Holster M.D.   On: 08/31/2018 02:22       Subjective: Patient seen and examined.  He had no complaints.  He was eager to go for therapies.  Patient stated that he is mobility is improving and he has more balance now.   Discharge Exam: Vitals:   09/10/18 0330 09/10/18 0844  BP: 108/73 117/73  Pulse: 89 87  Resp: 18 16  Temp: 98 F (36.7 C) 98.2 F (36.8 C)  SpO2: 99% 100%   Vitals:   09/09/18 2025 09/09/18 2357 09/10/18 0330 09/10/18 0844  BP: 117/81 (!) 102/58 108/73 117/73  Pulse: 96 86 89 87  Resp: 18 16 18 16   Temp: 98.1 F (36.7 C) 98.1 F (36.7 C) 98 F (36.7 C) 98.2 F (36.8 C)  TempSrc: Oral Oral Oral Oral  SpO2: 100% 100% 99% 100%  Weight:   66.7 kg   Height:        General: Pt is alert, awake, not  in acute distress Cardiovascular: RRR, S1/S2 +, no rubs, no gallops Respiratory: CTA bilaterally, no wheezing, no rhonchi Abdominal: Soft, NT, ND, bowel sounds + Extremities: no edema, no cyanosis    The results of significant diagnostics from this hospitalization (including imaging, microbiology, ancillary and laboratory) are listed below for reference.     Microbiology: Recent Results (from the past 240 hour(s))  SARS Coronavirus 2 (CEPHEID - Performed in [place] hospital lab), Hosp Order     Status: None   Collection Time: 09/07/18 10:46 PM   Specimen: Nasopharyngeal Swab  Result Value Ref Range Status   SARS Coronavirus 2 NEGATIVE NEGATIVE Final    Comment: (NOTE) If result is NEGATIVE SARS-CoV-2 target nucleic acids are NOT DETECTED. The SARS-CoV-2 RNA is generally detectable in upper and lower  respiratory specimens during the acute phase of infection. The lowest  concentration of  SARS-CoV-2 viral copies this assay can detect is 250  copies / mL. A negative result does not preclude SARS-CoV-2 infection  and should not be used as the sole basis for treatment or other  patient management decisions.  A negative result may occur with  improper specimen collection / handling, submission of specimen other  than nasopharyngeal swab, presence of viral mutation(s) within the  areas targeted by this assay, and inadequate number of viral copies  (<250 copies / mL). A negative result must be combined with clinical  observations, patient history, and epidemiological information. If result is POSITIVE SARS-CoV-2 target nucleic acids are DETECTED. The SARS-CoV-2 RNA is generally detectable in upper and lower  respiratory specimens dur ing the acute phase of infection.  Positive  results are indicative of active infection with SARS-CoV-2.  Clinical  correlation with patient history and other diagnostic information is  necessary to determine patient infection status.  Positive results do  not rule out bacterial infection or co-infection with other viruses. If result is PRESUMPTIVE POSTIVE SARS-CoV-2 nucleic acids MAY BE PRESENT.   A presumptive positive result was obtained on the submitted specimen  and confirmed on repeat testing.  While 2019 novel coronavirus  (SARS-CoV-2) nucleic acids may be present in the submitted sample  additional confirmatory testing may be necessary for epidemiological  and / or clinical management purposes  to differentiate between  SARS-CoV-2 and other Sarbecovirus currently known to infect humans.  If clinically indicated additional testing with an alternate test  methodology [phone]) is advised. The SARS-CoV-2 RNA is generally  detectable in upper and lower respiratory sp ecimens during the acute  phase of infection. The expected result is Negative. Fact Sheet for Patients:  StrictlyIdeas.no Fact Sheet for Healthcare  Providers: BankingDealers.co.za This test is not yet approved or cleared by the [place] FDA and has been authorized for detection and/or diagnosis of SARS-CoV-2 by FDA under an Emergency Use Authorization (EUA).  This EUA will remain in effect (meaning this test can be used) for the duration of the COVID-19 declaration under Section 564(b)(1) of the Act, 21 U.S.C. section 360bbb-3(b)(1), unless the authorization is terminated or revoked sooner. Performed at [facility], [place] [address] [place], [place] [address]   SARS Coronavirus 2 (CEPHEID - Performed in [place] hospital lab), Hosp Order     Status: None   Collection Time: 09/09/18  4:35 PM   Specimen: Nasopharyngeal Swab  Result Value Ref Range Status   SARS Coronavirus 2 NEGATIVE NEGATIVE Final    Comment: (NOTE) If result is NEGATIVE SARS-CoV-2 target nucleic acids are NOT DETECTED. The  SARS-CoV-2 RNA is generally detectable in upper and lower  respiratory specimens during the acute phase of infection. The lowest  concentration of SARS-CoV-2 viral copies this assay can detect is 250  copies / mL. A negative result does not preclude SARS-CoV-2 infection  and should not be used as the sole basis for treatment or other  patient management decisions.  A negative result may occur with  improper specimen collection / handling, submission of specimen other  than nasopharyngeal swab, presence of viral mutation(s) within the  areas targeted by this assay, and inadequate number of viral copies  (<250 copies / mL). A negative result must be combined with clinical  observations, patient history, and epidemiological information. If result is POSITIVE SARS-CoV-2 target nucleic acids are DETECTED. The SARS-CoV-2 RNA is generally detectable in upper and lower  respiratory specimens dur ing the acute phase of infection.  Positive  results are indicative of active infection with SARS-CoV-2.  Clinical   correlation with patient history and other diagnostic information is  necessary to determine patient infection status.  Positive results do  not rule out bacterial infection or co-infection with other viruses. If result is PRESUMPTIVE POSTIVE SARS-CoV-2 nucleic acids MAY BE PRESENT.   A presumptive positive result was obtained on the submitted specimen  and confirmed on repeat testing.  While 2019 novel coronavirus  (SARS-CoV-2) nucleic acids may be present in the submitted sample  additional confirmatory testing may be necessary for epidemiological  and / or clinical management purposes  to differentiate between  SARS-CoV-2 and other Sarbecovirus currently known to infect humans.  If clinically indicated additional testing with an alternate test  methodology [phone]) is advised. The SARS-CoV-2 RNA is generally  detectable in upper and lower respiratory sp ecimens during the acute  phase of infection. The expected result is Negative. Fact Sheet for Patients:  StrictlyIdeas.no Fact Sheet for Healthcare Providers: BankingDealers.co.za This test is not yet approved or cleared by the [place] FDA and has been authorized for detection and/or diagnosis of SARS-CoV-2 by FDA under an Emergency Use Authorization (EUA).  This EUA will remain in effect (meaning this test can be used) for the duration of the COVID-19 declaration under Section 564(b)(1) of the Act, 21 U.S.C. section 360bbb-3(b)(1), unless the authorization is terminated or revoked sooner. Performed at [facility], [place] [address], [place], [place] [address]      Labs: BNP (last 3 results) Recent Labs    05/05/18 0442 05/12/18 0629 08/31/18 0134  BNP 490.9* 1,145.7* 2,683.4*   Basic Metabolic Panel: Recent Labs  Lab 09/07/18 1708 09/08/18 0248 09/09/18 0443  NA 138 139 137  K 4.1 3.9 4.4  CL 112* 113* 111  CO2 16* 16* 18*  GLUCOSE 106* 95 102*  BUN 59*  57* 48*  CREATININE 2.76* 2.44* 2.09*  CALCIUM 9.3 9.0 8.7*  MG  --  1.6*  --    Liver Function Tests: Recent Labs  Lab 09/08/18 0248 09/09/18 0443  AST 13* 12*  ALT 11 10  ALKPHOS 61 50  BILITOT 0.5 0.4  PROT 6.7 6.3*  ALBUMIN 3.0* 2.7*   No results for input(s): LIPASE, AMYLASE in the last 168 hours. No results for input(s): AMMONIA in the last 168 hours. CBC: Recent Labs  Lab 09/07/18 1028 09/07/18 1708 09/08/18 0248  WBC  --  10.8* 9.0  NEUTROABS  --   --  5.4  HGB 11.3* 11.0* 10.7*  HCT 36.2* 35.8* 33.6*  32.0*  MCV  --  108.5* 104.7*  PLT  --  237 201   Cardiac Enzymes: Recent Labs  Lab 09/08/18 0248  CKTOTAL 82   BNP: Invalid input(s): POCBNP CBG: Recent Labs  Lab 09/09/18 0616 09/10/18 0555  GLUCAP 90 89   D-Dimer No results for input(s): DDIMER in the last 72 hours. Hgb A1c No results for input(s): HGBA1C in the last 72 hours. Lipid Profile No results for input(s): CHOL, HDL, LDLCALC, TRIG, CHOLHDL, LDLDIRECT in the last 72 hours. Thyroid function studies Recent Labs    09/08/18 0248  TSH 0.744   Anemia work up Recent Labs    09/07/18 1028 09/08/18 0248  VITAMINB12  --  172*  FERRITIN 793*  --   TIBC 261  --   IRON 58  --    Urinalysis    Component Value Date/Time   COLORURINE YELLOW 09/07/2018 2130   APPEARANCEUR CLEAR 09/07/2018 2130   LABSPEC 1.020 09/07/2018 2130   PHURINE 5.0 09/07/2018 2130   GLUCOSEU NEGATIVE 09/07/2018 2130   HGBUR NEGATIVE 09/07/2018 2130   BILIRUBINUR NEGATIVE 09/07/2018 2130   BILIRUBINUR neg 10/03/2014 [place] NEGATIVE 09/07/2018 2130   PROTEINUR NEGATIVE 09/07/2018 2130   UROBILINOGEN negative 10/03/2014 1152   UROBILINOGEN 0.2 07/10/2010 1709   NITRITE NEGATIVE 09/07/2018 2130   LEUKOCYTESUR NEGATIVE 09/07/2018 2130   Sepsis Labs Invalid input(s): PROCALCITONIN,  WBC,  LACTICIDVEN Microbiology Recent Results (from the past 240 hour(s))  SARS Coronavirus 2 (CEPHEID - Performed  in [place] hospital lab), Hosp Order     Status: None   Collection Time: 09/07/18 10:46 PM   Specimen: Nasopharyngeal Swab  Result Value Ref Range Status   SARS Coronavirus 2 NEGATIVE NEGATIVE Final    Comment: (NOTE) If result is NEGATIVE SARS-CoV-2 target nucleic acids are NOT DETECTED. The SARS-CoV-2 RNA is generally detectable in upper and lower  respiratory specimens during the acute phase of infection. The lowest  concentration of SARS-CoV-2 viral copies this assay can detect is 250  copies / mL. A negative result does not preclude SARS-CoV-2 infection  and should not be used as the sole basis for treatment or other  patient management decisions.  A negative result may occur with  improper specimen collection / handling, submission of specimen other  than nasopharyngeal swab, presence of viral mutation(s) within the  areas targeted by this assay, and inadequate number of viral copies  (<250 copies / mL). A negative result must be combined with clinical  observations, patient history, and epidemiological information. If result is POSITIVE SARS-CoV-2 target nucleic acids are DETECTED. The SARS-CoV-2 RNA is generally detectable in upper and lower  respiratory specimens dur ing the acute phase of infection.  Positive  results are indicative of active infection with SARS-CoV-2.  Clinical  correlation with patient history and other diagnostic information is  necessary to determine patient infection status.  Positive results do  not rule out bacterial infection or co-infection with other viruses. If result is PRESUMPTIVE POSTIVE SARS-CoV-2 nucleic acids MAY BE PRESENT.   A presumptive positive result was obtained on the submitted specimen  and confirmed on repeat testing.  While 2019 novel coronavirus  (SARS-CoV-2) nucleic acids may be present in the submitted sample  additional confirmatory testing may be necessary for epidemiological  and / or clinical management purposes  to  differentiate between  SARS-CoV-2 and other Sarbecovirus currently known to infect humans.  If clinically indicated additional testing with an alternate test  methodology [phone]) is advised. The SARS-CoV-2 RNA  is generally  detectable in upper and lower respiratory sp ecimens during the acute  phase of infection. The expected result is Negative. Fact Sheet for Patients:  StrictlyIdeas.no Fact Sheet for Healthcare Providers: BankingDealers.co.za This test is not yet approved or cleared by the [place] FDA and has been authorized for detection and/or diagnosis of SARS-CoV-2 by FDA under an Emergency Use Authorization (EUA).  This EUA will remain in effect (meaning this test can be used) for the duration of the COVID-19 declaration under Section 564(b)(1) of the Act, 21 U.S.C. section 360bbb-3(b)(1), unless the authorization is terminated or revoked sooner. Performed at [facility], [place] [address] [place], [place] [address]   SARS Coronavirus 2 (CEPHEID - Performed in [place] hospital lab), Hosp Order     Status: None   Collection Time: 09/09/18  4:35 PM   Specimen: Nasopharyngeal Swab  Result Value Ref Range Status   SARS Coronavirus 2 NEGATIVE NEGATIVE Final    Comment: (NOTE) If result is NEGATIVE SARS-CoV-2 target nucleic acids are NOT DETECTED. The SARS-CoV-2 RNA is generally detectable in upper and lower  respiratory specimens during the acute phase of infection. The lowest  concentration of SARS-CoV-2 viral copies this assay can detect is 250  copies / mL. A negative result does not preclude SARS-CoV-2 infection  and should not be used as the sole basis for treatment or other  patient management decisions.  A negative result may occur with  improper specimen collection / handling, submission of specimen other  than nasopharyngeal swab, presence of viral mutation(s) within the  areas targeted by this assay, and  inadequate number of viral copies  (<250 copies / mL). A negative result must be combined with clinical  observations, patient history, and epidemiological information. If result is POSITIVE SARS-CoV-2 target nucleic acids are DETECTED. The SARS-CoV-2 RNA is generally detectable in upper and lower  respiratory specimens dur ing the acute phase of infection.  Positive  results are indicative of active infection with SARS-CoV-2.  Clinical  correlation with patient history and other diagnostic information is  necessary to determine patient infection status.  Positive results do  not rule out bacterial infection or co-infection with other viruses. If result is PRESUMPTIVE POSTIVE SARS-CoV-2 nucleic acids MAY BE PRESENT.   A presumptive positive result was obtained on the submitted specimen  and confirmed on repeat testing.  While 2019 novel coronavirus  (SARS-CoV-2) nucleic acids may be present in the submitted sample  additional confirmatory testing may be necessary for epidemiological  and / or clinical management purposes  to differentiate between  SARS-CoV-2 and other Sarbecovirus currently known to infect humans.  If clinically indicated additional testing with an alternate test  methodology [id number]) is advised. The SARS-CoV-2 RNA is generally  detectable in upper and lower respiratory sp ecimens during the acute  phase of infection. The expected result is Negative. Fact Sheet for Patients:  StrictlyIdeas.no Fact Sheet for Healthcare Providers: BankingDealers.co.za This test is not yet approved or cleared by the [place] FDA and has been authorized for detection and/or diagnosis of SARS-CoV-2 by FDA under an Emergency Use Authorization (EUA).  This EUA will remain in effect (meaning this test can be used) for the duration of the COVID-19 declaration under Section 564(b)(1) of the Act, 21 U.S.C. section 360bbb-3(b)(1), unless the  authorization is terminated or revoked sooner. Performed at [facility], [place] [address], [place], [place] [address]      Time coordinating discharge: 30 minutes  SIGNED:   Barb Merino, MD  [facility] 09/10/2018, 9:03 AM

## 2018-09-10 NOTE — Plan of Care (Signed)
In progress toward  goals.

## 2018-09-11 DIAGNOSIS — N170 Acute kidney failure with tubular necrosis: Secondary | ICD-10-CM | POA: Diagnosis not present

## 2018-09-11 DIAGNOSIS — N183 Chronic kidney disease, stage 3 (moderate): Secondary | ICD-10-CM | POA: Diagnosis not present

## 2018-09-11 LAB — GLUCOSE, CAPILLARY: Glucose-Capillary: 94 mg/dL (ref 70–99)

## 2018-09-11 MED ORDER — ISOSORBIDE MONONITRATE ER 30 MG PO TB24
30.0000 mg | ORAL_TABLET | Freq: Every day | ORAL | Status: DC
  Administered 2018-09-11 – 2018-09-13 (×3): 30 mg via ORAL
  Filled 2018-09-11 (×3): qty 1

## 2018-09-11 MED ORDER — FUROSEMIDE 20 MG PO TABS
20.0000 mg | ORAL_TABLET | ORAL | Status: DC
  Administered 2018-09-11 – 2018-09-13 (×2): 20 mg via ORAL
  Filled 2018-09-11 (×3): qty 1

## 2018-09-11 NOTE — Progress Notes (Signed)
Occupational Therapy Treatment Patient Details Name: Brent [name] MRN: [id number] DOB: [birth year] Today's Date: 09/11/2018    History of present illness Patient is a 73 y/o male presenting to the ED on 09/07/2018 with primary complaints of general weakness and gait difficulty. Past medical history significant for chronic combined systolic and diastolic CHF, bipolar disorder, Crohn's disease on Remicade, chronic kidney disease stage III, and recent admission with acute CHF, discharged on 09/02/2018. Admitted for continued work-up.   OT comments  Pt progressing with care and standing for grooming tasks. VSS. Pt ambulating with minguardA and fair balance. Pt seated for LB Dressing with fifgure 4 technique with set-upA. Pt stood for toilet hygiene with minguardA. Pt tolerating session well. No LOB episodes. Pt continues to take small steps with encouragement to elongate step length. Pt continues to benefit from continued OT skilled services for ADL and safety. OT following acutely.    Follow Up Recommendations  SNF    Equipment Recommendations  None recommended by OT    Recommendations for Other Services      Precautions / Restrictions Precautions Precautions: Fall Precaution Comments: fall history Restrictions Weight Bearing Restrictions: No       Mobility Bed Mobility Overal bed mobility: Needs Assistance Bed Mobility: Sit to Supine     Supine to sit: Min guard     General bed mobility comments: min guard for safety  Transfers Overall transfer level: Needs assistance Equipment used: Rolling walker (2 wheeled) Transfers: Sit to/from [facility] Sit to Stand: Min assist Stand pivot transfers: Min assist       General transfer comment: minA for initial standing balance    Balance Overall balance assessment: Needs assistance Sitting-balance support: Single extremity supported;Feet supported Sitting balance-Leahy Scale: Good     Standing balance support:  Bilateral upper extremity supported;During functional activity Standing balance-Leahy Scale: Poor Standing balance comment: reliant on RW                           ADL either performed or assessed with clinical judgement   ADL Overall ADL's : Needs assistance/impaired     Grooming: Min guard;Standing Grooming Details (indicate cue type and reason): Stood at sink x5 mins for grooming             Lower Body Dressing: Set up;Sitting/lateral leans Lower Body Dressing Details (indicate cue type and reason): figure 4 technique for seated LB dressing             Functional mobility during ADLs: Min guard;Minimal assistance;Rolling walker;Cueing for safety General ADL Comments: Pt feels sore from recent fall. Pt reports that he prefers d/c to SNF to increase independence level. Pt continues to be very fatigued after exertion.     Vision   Vision Assessment?: No apparent visual deficits   Perception     Praxis      Cognition Arousal/Alertness: Awake/alert Behavior During Therapy: WFL for tasks assessed/performed Overall Cognitive Status: Within Functional Limits for tasks assessed                                          Exercises     Shoulder Instructions       General Comments      Pertinent Vitals/ Pain       Pain Assessment: No/denies pain  Home Living  Prior Functioning/Environment              Frequency  Min 2X/week        Progress Toward Goals  OT Goals(current goals can now be found in the care plan section)  Progress towards OT goals: Progressing toward goals  Acute Rehab OT Goals Patient Stated Goal: increase strength OT Goal Formulation: With patient Time For Goal Achievement: 09/23/18 Potential to Achieve Goals: Good ADL Goals Pt Will Perform Grooming: with modified independence;standing Pt Will Perform Upper Body Dressing: with modified  independence;standing Pt Will Perform Lower Body Dressing: with modified independence;sit to/from stand Pt Will Perform Toileting - Clothing Manipulation and hygiene: with modified independence;sitting/lateral leans;sit to/from stand Additional ADL Goal #1: Pt will perform higher level executive functioning tasks x10 mins of sustained attention with supervision level and minimal cueing.  Plan Discharge plan remains appropriate    Co-evaluation                 AM-PAC OT "6 Clicks" Daily Activity     Outcome Measure   Help from another person eating meals?: None Help from another person taking care of personal grooming?: A Little Help from another person toileting, which includes using toliet, bedpan, or urinal?: A Lot Help from another person bathing (including washing, rinsing, drying)?: A Lot Help from another person to put on and taking off regular upper body clothing?: A Little Help from another person to put on and taking off regular lower body clothing?: A Lot 6 Click Score: 16    End of Session Equipment Utilized During Treatment: Gait belt;Rolling walker  OT Visit Diagnosis: Muscle weakness (generalized) (M62.81);Other symptoms and signs involving cognitive function   Activity Tolerance Patient tolerated treatment well   Patient Left in chair;with call bell/phone within reach;with chair alarm set   Nurse Communication Mobility status        Time: [id number] OT Time Calculation (min): 17 min  Charges: OT General Charges $OT Visit: 1 Visit OT Treatments $Self Care/Home Management : 8-22 mins  Darryl Nestle) Marsa Aris OTR/L Acute Rehabilitation Services Pager: [phone] Office: [phone]    Audie Pinto 09/11/2018, 10:58 AM

## 2018-09-11 NOTE — TOC Progression Note (Signed)
Transition of Care [facility]) - Progression Note    Patient Details  Name: Brent [name] MRN: [id number] Date of Birth: [birth year]  Transition of Care [facility]) CM/SW Contact  Eileen Stanford, LCSW Phone Number: 09/11/2018, 10:17 AM  Clinical Narrative:   Pt's PASARR is under QMHP Evaluation, therefore pt can not d/c to SNF until PASARR is provided. Pt can not go to SNF without PASARR.     Expected Discharge Plan: Skilled Nursing Facility Barriers to Discharge: Ship broker, [place] Forensic scientist)  Expected Discharge Plan and Services Expected Discharge Plan: [place] In-house Referral: Clinical Social Work Discharge Planning Services: CM Consult Post Acute Care Choice: [place] Living arrangements for the past 2 months: Apartment(ground level) Expected Discharge Date: 09/10/18                                     Social Determinants of Health (SDOH) Interventions    Readmission Risk Interventions No flowsheet data found.

## 2018-09-11 NOTE — Progress Notes (Signed)
PROGRESS NOTE    Brent [name]  UTM:[id number] DOB: 1945/10/10 DOA: 09/07/2018 PCP: Gayland Curry, DO    Brief Narrative:  73 year old male with history of chronic combined systolic and diastolic congestive heart failure, known ejection fraction of 30%, bipolar disorder, Crohn's disease on Remicade and chronic kidney disease stage III with baseline creatinine about 1.7, recent hospitalization for acute CHF was discharged home came back with generalized weakness and difficulty walking.  With significant symptoms and some orthostatic changes he was admitted to the hospital.  Patient is on heart failure regimen with diuretics and was found with positive orthostatic changes and clinical dehydration.  In the emergency room he was afebrile, he was on room air, blood pressures were stable on laying down.  EKG was normal.  Creatinine was 2.76 up from baseline of 1.7.  CT head normal.   Assessment & Plan:   Principal Problem:   Acute renal failure superimposed on stage 3 chronic kidney disease (HCC) Active Problems:   Bipolar 1 disorder (HCC)   Chronic combined systolic and diastolic congestive heart failure (HCC)   Macrocytic anemia   Ataxia   Metabolic acidosis   Orthostasis  AKI superimposed on stage III CKD: Prerenal azotemia in the setting of poor appetite, diuresis and low blood pressure.  Good urine output.    Will resume his Lasix.  Chronic combined heart failure: Well compensated and euvolemic.  Diuretics and nitrates well on hold.  Continue beta-blockers.  His weight has been stable.    Resume his Lasix and nitrates.  He is already on beta-blockers.  Profound physical debility: With recent hospitalization and acute medical problems.  He will benefit with inpatient therapies and has been referred to skilled nursing home.  Macrocytic anemia: Stable.  Crohn's disease: Stable.  Ataxia/lethargy: Mostly due to advanced debility.  CT head was normal.  Electrolytes are normal.   Valproate level 45, B12, CK and TSH within normal limits.  He will benefit with inpatient therapies.  Patient is medically stable to transfer to a skilled level of care when bed is available and arrangements are made.  DVT prophylaxis: Heparin subcu Code Status: DNR Family Communication: None Disposition Plan: Skilled nursing facility when bed available.   Consultants:   None  Procedures:   None  Antimicrobials:   None   Subjective: Patient seen and examined.  Unfortunately could not be discharged to a skilled nursing rehab yesterday.  He had no overnight events.  Objective: Vitals:   09/10/18 2352 09/11/18 0340 09/11/18 0500 09/11/18 0751  BP: (!) 101/59 132/80  120/73  Pulse: 92 94  92  Resp: 18 18  18   Temp: 98.4 F (36.9 C) 98.5 F (36.9 C)  97.9 F (36.6 C)  TempSrc: Oral Oral  Oral  SpO2: 96% 98%  100%  Weight:   70.3 kg   Height:        Intake/Output Summary (Last 24 hours) at 09/11/2018 1035 Last data filed at 09/11/2018 0853 Gross per 24 hour  Intake 843 ml  Output 300 ml  Net 543 ml   Filed Weights   09/09/18 0318 09/10/18 0330 09/11/18 0500  Weight: 67.1 kg 66.7 kg 70.3 kg    Examination:  General exam: Appears calm and comfortable  Respiratory system: Clear to auscultation. Respiratory effort normal. Cardiovascular system: S1 & S2 heard, RRR. No JVD, murmurs, rubs, gallops or clicks. No pedal edema. Gastrointestinal system: Abdomen is nondistended, soft and nontender. No organomegaly or masses felt. Normal bowel sounds heard.  Central nervous system: Alert and oriented. No focal neurological deficits. Extremities: Symmetric 5 x 5 power. Skin: No rashes, lesions or ulcers Psychiatry: Judgement and insight appear normal. Mood & affect appropriate.     Data Reviewed: I have personally reviewed following labs and imaging studies  CBC: Recent Labs  Lab 09/07/18 1028 09/07/18 1708 09/08/18 0248  WBC  --  10.8* 9.0  NEUTROABS  --   --  5.4   HGB 11.3* 11.0* 10.7*  HCT 36.2* 35.8* 33.6*  32.0*  MCV  --  108.5* 104.7*  PLT  --  237 364   Basic Metabolic Panel: Recent Labs  Lab 09/07/18 1708 09/08/18 0248 09/09/18 0443  NA 138 139 137  K 4.1 3.9 4.4  CL 112* 113* 111  CO2 16* 16* 18*  GLUCOSE 106* 95 102*  BUN 59* 57* 48*  CREATININE 2.76* 2.44* 2.09*  CALCIUM 9.3 9.0 8.7*  MG  --  1.6*  --    GFR: Estimated Creatinine Clearance: 31.3 mL/min (A) (by C-G formula based on SCr of 2.09 mg/dL (H)). Liver Function Tests: Recent Labs  Lab 09/08/18 0248 09/09/18 0443  AST 13* 12*  ALT 11 10  ALKPHOS 61 50  BILITOT 0.5 0.4  PROT 6.7 6.3*  ALBUMIN 3.0* 2.7*   No results for input(s): LIPASE, AMYLASE in the last 168 hours. No results for input(s): AMMONIA in the last 168 hours. Coagulation Profile: No results for input(s): INR, PROTIME in the last 168 hours. Cardiac Enzymes: Recent Labs  Lab 09/08/18 0248  CKTOTAL 82   BNP (last 3 results) No results for input(s): PROBNP in the last 8760 hours. HbA1C: No results for input(s): HGBA1C in the last 72 hours. CBG: Recent Labs  Lab 09/09/18 0616 09/10/18 0555 09/11/18 0611  GLUCAP 90 89 94   Lipid Profile: No results for input(s): CHOL, HDL, LDLCALC, TRIG, CHOLHDL, LDLDIRECT in the last 72 hours. Thyroid Function Tests: No results for input(s): TSH, T4TOTAL, FREET4, T3FREE, THYROIDAB in the last 72 hours. Anemia Panel: No results for input(s): VITAMINB12, FOLATE, FERRITIN, TIBC, IRON, RETICCTPCT in the last 72 hours. Sepsis Labs: No results for input(s): PROCALCITON, LATICACIDVEN in the last 168 hours.  Recent Results (from the past 240 hour(s))  SARS Coronavirus 2 (CEPHEID - Performed in [place] hospital lab), Hosp Order     Status: None   Collection Time: 09/07/18 10:46 PM   Specimen: Nasopharyngeal Swab  Result Value Ref Range Status   SARS Coronavirus 2 NEGATIVE NEGATIVE Final    Comment: (NOTE) If result is NEGATIVE SARS-CoV-2 target  nucleic acids are NOT DETECTED. The SARS-CoV-2 RNA is generally detectable in upper and lower  respiratory specimens during the acute phase of infection. The lowest  concentration of SARS-CoV-2 viral copies this assay can detect is 250  copies / mL. A negative result does not preclude SARS-CoV-2 infection  and should not be used as the sole basis for treatment or other  patient management decisions.  A negative result may occur with  improper specimen collection / handling, submission of specimen other  than nasopharyngeal swab, presence of viral mutation(s) within the  areas targeted by this assay, and inadequate number of viral copies  (<250 copies / mL). A negative result must be combined with clinical  observations, patient history, and epidemiological information. If result is POSITIVE SARS-CoV-2 target nucleic acids are DETECTED. The SARS-CoV-2 RNA is generally detectable in upper and lower  respiratory specimens dur ing the acute phase of infection.  Positive  results are indicative of active infection with SARS-CoV-2.  Clinical  correlation with patient history and other diagnostic information is  necessary to determine patient infection status.  Positive results do  not rule out bacterial infection or co-infection with other viruses. If result is PRESUMPTIVE POSTIVE SARS-CoV-2 nucleic acids MAY BE PRESENT.   A presumptive positive result was obtained on the submitted specimen  and confirmed on repeat testing.  While 2019 novel coronavirus  (SARS-CoV-2) nucleic acids may be present in the submitted sample  additional confirmatory testing may be necessary for epidemiological  and / or clinical management purposes  to differentiate between  SARS-CoV-2 and other Sarbecovirus currently known to infect humans.  If clinically indicated additional testing with an alternate test  methodology [phone]) is advised. The SARS-CoV-2 RNA is generally  detectable in upper and lower  respiratory sp ecimens during the acute  phase of infection. The expected result is Negative. Fact Sheet for Patients:  StrictlyIdeas.no Fact Sheet for Healthcare Providers: BankingDealers.co.za This test is not yet approved or cleared by the [place] FDA and has been authorized for detection and/or diagnosis of SARS-CoV-2 by FDA under an Emergency Use Authorization (EUA).  This EUA will remain in effect (meaning this test can be used) for the duration of the COVID-19 declaration under Section 564(b)(1) of the Act, 21 U.S.C. section 360bbb-3(b)(1), unless the authorization is terminated or revoked sooner. Performed at [facility], [place] [address] [place], [place] [address]   SARS Coronavirus 2 (CEPHEID - Performed in [place] hospital lab), Hosp Order     Status: None   Collection Time: 09/09/18  4:35 PM   Specimen: Nasopharyngeal Swab  Result Value Ref Range Status   SARS Coronavirus 2 NEGATIVE NEGATIVE Final    Comment: (NOTE) If result is NEGATIVE SARS-CoV-2 target nucleic acids are NOT DETECTED. The SARS-CoV-2 RNA is generally detectable in upper and lower  respiratory specimens during the acute phase of infection. The lowest  concentration of SARS-CoV-2 viral copies this assay can detect is 250  copies / mL. A negative result does not preclude SARS-CoV-2 infection  and should not be used as the sole basis for treatment or other  patient management decisions.  A negative result may occur with  improper specimen collection / handling, submission of specimen other  than nasopharyngeal swab, presence of viral mutation(s) within the  areas targeted by this assay, and inadequate number of viral copies  (<250 copies / mL). A negative result must be combined with clinical  observations, patient history, and epidemiological information. If result is POSITIVE SARS-CoV-2 target nucleic acids are DETECTED. The SARS-CoV-2 RNA  is generally detectable in upper and lower  respiratory specimens dur ing the acute phase of infection.  Positive  results are indicative of active infection with SARS-CoV-2.  Clinical  correlation with patient history and other diagnostic information is  necessary to determine patient infection status.  Positive results do  not rule out bacterial infection or co-infection with other viruses. If result is PRESUMPTIVE POSTIVE SARS-CoV-2 nucleic acids MAY BE PRESENT.   A presumptive positive result was obtained on the submitted specimen  and confirmed on repeat testing.  While 2019 novel coronavirus  (SARS-CoV-2) nucleic acids may be present in the submitted sample  additional confirmatory testing may be necessary for epidemiological  and / or clinical management purposes  to differentiate between  SARS-CoV-2 and other Sarbecovirus currently known to infect humans.  If clinically indicated additional testing with an  alternate test  methodology [phone]) is advised. The SARS-CoV-2 RNA is generally  detectable in upper and lower respiratory sp ecimens during the acute  phase of infection. The expected result is Negative. Fact Sheet for Patients:  StrictlyIdeas.no Fact Sheet for Healthcare Providers: BankingDealers.co.za This test is not yet approved or cleared by the [place] FDA and has been authorized for detection and/or diagnosis of SARS-CoV-2 by FDA under an Emergency Use Authorization (EUA).  This EUA will remain in effect (meaning this test can be used) for the duration of the COVID-19 declaration under Section 564(b)(1) of the Act, 21 U.S.C. section 360bbb-3(b)(1), unless the authorization is terminated or revoked sooner. Performed at [facility], [place] [address], [place], [place] [address]          Radiology Studies: No results found.      Scheduled Meds: . allopurinol  100 mg Oral Daily  . feeding  supplement (ENSURE ENLIVE)  237 mL Oral BID BM  . heparin  5,000 Units Subcutaneous Q8H  . megestrol  1,600 mg Oral Daily  . metoprolol tartrate  12.5 mg Oral BID  . pantoprazole  40 mg Oral Daily  . sodium bicarbonate  650 mg Oral TID  . sodium chloride flush  3 mL Intravenous Once  . sodium chloride flush  3 mL Intravenous Q12H  . sodium chloride flush  3 mL Intravenous Q12H   Continuous Infusions: . sodium chloride 10 mL/hr at 09/08/18 1623     LOS: 0 days    Time spent: 15 minutes    Barb Merino, MD [facility] Pager [phone]  If 7PM-7AM, please contact night-coverage [email or web address] Password [facility] 09/11/2018, 10:35 AM

## 2018-09-11 NOTE — Progress Notes (Signed)
Patient having staff take & keys, bottle opener, extra card, sheetz card, MVP card so neighbor Roanna Raider can pick up

## 2018-09-12 DIAGNOSIS — N183 Chronic kidney disease, stage 3 (moderate): Secondary | ICD-10-CM | POA: Diagnosis not present

## 2018-09-12 DIAGNOSIS — N170 Acute kidney failure with tubular necrosis: Secondary | ICD-10-CM | POA: Diagnosis not present

## 2018-09-12 LAB — BASIC METABOLIC PANEL
Anion gap: 8 (ref 5–15)
BUN: 48 mg/dL — ABNORMAL HIGH (ref 8–23)
CO2: 19 mmol/L — ABNORMAL LOW (ref 22–32)
Calcium: 9 mg/dL (ref 8.9–10.3)
Chloride: 109 mmol/L (ref 98–111)
Creatinine, Ser: 2.25 mg/dL — ABNORMAL HIGH (ref 0.61–1.24)
GFR calc Af Amer: 32 mL/min — ABNORMAL LOW (ref >60)
GFR calc non Af Amer: 28 mL/min — ABNORMAL LOW (ref >60)
Glucose, Bld: 100 mg/dL — ABNORMAL HIGH (ref 70–99)
Potassium: 5.1 mmol/L (ref 3.5–5.1)
Sodium: 136 mmol/L (ref 135–145)

## 2018-09-12 LAB — GLUCOSE, CAPILLARY: Glucose-Capillary: 98 mg/dL (ref 70–99)

## 2018-09-12 MED ORDER — MEGESTROL ACETATE 40 MG/ML PO SUSP: 400.0000 mg | Freq: Every day | ORAL | 0 refills | Status: DC

## 2018-09-12 MED ORDER — MEGESTROL ACETATE 400 MG/10ML PO SUSP
400.0000 mg | Freq: Every day | ORAL | Status: DC
  Administered 2018-09-13: 400 mg via ORAL
  Filled 2018-09-12: qty 10

## 2018-09-12 NOTE — Progress Notes (Signed)
PASRR is still currently running. Hopefully we will receive on Monday and the patient should be able to discharge.   CSW will continue to follow.   Domenic Schwab, MSW, [place] Worker [facility]  [phone]

## 2018-09-12 NOTE — Progress Notes (Signed)
Brent [name]  ACZ:[id number] DOB: 02/14/1946 DOA: 09/07/2018 PCP: Gayland Curry, DO    Brief Narrative:  73 year old male with history of chronic combined systolic and diastolic congestive heart failure, known ejection fraction of 30%, bipolar disorder, Crohn's disease on Remicade and chronic kidney disease stage III with baseline creatinine about 1.7, recent hospitalization for acute CHF was discharged home came back with generalized weakness and difficulty walking.  With significant symptoms and some orthostatic changes he was admitted to the hospital.  Patient is on heart failure regimen with diuretics and was found with positive orthostatic changes and clinical dehydration.  In the emergency room he was afebrile, he was on room air, blood pressures were stable on laying down.  EKG was normal.  Creatinine was 2.76 up from baseline of 1.7.  CT head normal.   Assessment & Plan:   Principal Problem:   Acute renal failure superimposed on stage 3 chronic kidney disease (HCC) Active Problems:   Bipolar 1 disorder (HCC)   Chronic combined systolic and diastolic congestive heart failure (HCC)   Macrocytic anemia   Ataxia   Metabolic acidosis   Orthostasis  AKI superimposed on stage III CKD: Prerenal azotemia in the setting of poor appetite, diuresis and low blood pressure.  Good urine output.    Will resume his Lasix. Creatinine is 2.2 today.  This may be his new baseline.  Patient looks euvolemic.  Will need to recheck within 1 week to ensure stabilization.  Chronic combined heart failure: Well compensated and euvolemic.  Diuretics and nitrates were on hold.  Continue beta-blockers.  His weight has been stable.    Resume his Lasix and nitrates.  He is already on beta-blockers.  Profound physical debility: With recent hospitalization and acute medical problems.  He will benefit with inpatient therapies and has been referred to skilled nursing home.  Macrocytic anemia:  Stable.  Crohn's disease: Stable.  Ataxia/lethargy: Mostly due to advanced debility.  CT head was normal.  Electrolytes are normal.  Valproate level 45, B12, CK and TSH within normal limits.  He will benefit with inpatient therapies.  Patient is medically stable to transfer to a skilled level of care when bed is available and arrangements are made.  DVT prophylaxis: Heparin subcu Code Status: DNR Family Communication: None Disposition Plan: Skilled nursing facility when bed available.   Consultants:   None  Procedures:   None  Antimicrobials:   None   Subjective: Patient seen and examined.  No overnight events. Objective: Vitals:   09/11/18 1951 09/12/18 0000 09/12/18 0400 09/12/18 0500  BP: 130/85 100/66 110/70   Pulse: (!) 108 91 89   Resp: 18 18 18    Temp: (!) 97.5 F (36.4 C) 98.2 F (36.8 C) 97.9 F (36.6 C)   TempSrc: Oral Oral Oral   SpO2: 100% 98% 99%   Weight:    69.7 kg  Height:        Intake/Output Summary (Last 24 hours) at 09/12/2018 1019 Last data filed at 09/11/2018 2244 Gross per 24 hour  Intake 3 ml  Output -  Net 3 ml   Filed Weights   09/10/18 0330 09/11/18 0500 09/12/18 0500  Weight: 66.7 kg 70.3 kg 69.7 kg    Examination:  General exam: Appears calm and comfortable  Respiratory system: Clear to auscultation. Respiratory effort normal. Cardiovascular system: S1 & S2 heard, RRR. No JVD, murmurs, rubs, gallops or clicks. No pedal edema. Gastrointestinal system: Abdomen is nondistended, soft  and nontender. No organomegaly or masses felt. Normal bowel sounds heard. Central nervous system: Alert and oriented. No focal neurological deficits. Extremities: Symmetric 5 x 5 power. Skin: No rashes, lesions or ulcers Psychiatry: Judgement and insight appear normal. Mood & affect appropriate.     Data Reviewed: I have personally reviewed following labs and imaging studies  CBC: Recent Labs  Lab 09/07/18 1028 09/07/18 1708 09/08/18  0248  WBC  --  10.8* 9.0  NEUTROABS  --   --  5.4  HGB 11.3* 11.0* 10.7*  HCT 36.2* 35.8* 33.6*  32.0*  MCV  --  108.5* 104.7*  PLT  --  237 427   Basic Metabolic Panel: Recent Labs  Lab 09/07/18 1708 09/08/18 0248 09/09/18 0443 09/12/18 0538  NA 138 139 137 136  K 4.1 3.9 4.4 5.1  CL 112* 113* 111 109  CO2 16* 16* 18* 19*  GLUCOSE 106* 95 102* 100*  BUN 59* 57* 48* 48*  CREATININE 2.76* 2.44* 2.09* 2.25*  CALCIUM 9.3 9.0 8.7* 9.0  MG  --  1.6*  --   --    GFR: Estimated Creatinine Clearance: 28.8 mL/min (A) (by C-G formula based on SCr of 2.25 mg/dL (H)). Liver Function Tests: Recent Labs  Lab 09/08/18 0248 09/09/18 0443  AST 13* 12*  ALT 11 10  ALKPHOS 61 50  BILITOT 0.5 0.4  PROT 6.7 6.3*  ALBUMIN 3.0* 2.7*   No results for input(s): LIPASE, AMYLASE in the last 168 hours. No results for input(s): AMMONIA in the last 168 hours. Coagulation Profile: No results for input(s): INR, PROTIME in the last 168 hours. Cardiac Enzymes: Recent Labs  Lab 09/08/18 0248  CKTOTAL 82   BNP (last 3 results) No results for input(s): PROBNP in the last 8760 hours. HbA1C: No results for input(s): HGBA1C in the last 72 hours. CBG: Recent Labs  Lab 09/09/18 0616 09/10/18 0555 09/11/18 0611 09/12/18 0713  GLUCAP 90 89 94 98   Lipid Profile: No results for input(s): CHOL, HDL, LDLCALC, TRIG, CHOLHDL, LDLDIRECT in the last 72 hours. Thyroid Function Tests: No results for input(s): TSH, T4TOTAL, FREET4, T3FREE, THYROIDAB in the last 72 hours. Anemia Panel: No results for input(s): VITAMINB12, FOLATE, FERRITIN, TIBC, IRON, RETICCTPCT in the last 72 hours. Sepsis Labs: No results for input(s): PROCALCITON, LATICACIDVEN in the last 168 hours.  Recent Results (from the past 240 hour(s))  SARS Coronavirus 2 (CEPHEID - Performed in [place] hospital lab), Hosp Order     Status: None   Collection Time: 09/07/18 10:46 PM   Specimen: Nasopharyngeal Swab  Result Value Ref  Range Status   SARS Coronavirus 2 NEGATIVE NEGATIVE Final    Comment: (NOTE) If result is NEGATIVE SARS-CoV-2 target nucleic acids are NOT DETECTED. The SARS-CoV-2 RNA is generally detectable in upper and lower  respiratory specimens during the acute phase of infection. The lowest  concentration of SARS-CoV-2 viral copies this assay can detect is 250  copies / mL. A negative result does not preclude SARS-CoV-2 infection  and should not be used as the sole basis for treatment or other  patient management decisions.  A negative result may occur with  improper specimen collection / handling, submission of specimen other  than nasopharyngeal swab, presence of viral mutation(s) within the  areas targeted by this assay, and inadequate number of viral copies  (<250 copies / mL). A negative result must be combined with clinical  observations, patient history, and epidemiological information. If result is POSITIVE  SARS-CoV-2 target nucleic acids are DETECTED. The SARS-CoV-2 RNA is generally detectable in upper and lower  respiratory specimens dur ing the acute phase of infection.  Positive  results are indicative of active infection with SARS-CoV-2.  Clinical  correlation with patient history and other diagnostic information is  necessary to determine patient infection status.  Positive results do  not rule out bacterial infection or co-infection with other viruses. If result is PRESUMPTIVE POSTIVE SARS-CoV-2 nucleic acids MAY BE PRESENT.   A presumptive positive result was obtained on the submitted specimen  and confirmed on repeat testing.  While 2019 novel coronavirus  (SARS-CoV-2) nucleic acids may be present in the submitted sample  additional confirmatory testing may be necessary for epidemiological  and / or clinical management purposes  to differentiate between  SARS-CoV-2 and other Sarbecovirus currently known to infect humans.  If clinically indicated additional testing with an  alternate test  methodology [phone]) is advised. The SARS-CoV-2 RNA is generally  detectable in upper and lower respiratory sp ecimens during the acute  phase of infection. The expected result is Negative. Fact Sheet for Patients:  StrictlyIdeas.no Fact Sheet for Healthcare Providers: BankingDealers.co.za This test is not yet approved or cleared by the [place] FDA and has been authorized for detection and/or diagnosis of SARS-CoV-2 by FDA under an Emergency Use Authorization (EUA).  This EUA will remain in effect (meaning this test can be used) for the duration of the COVID-19 declaration under Section 564(b)(1) of the Act, 21 U.S.C. section 360bbb-3(b)(1), unless the authorization is terminated or revoked sooner. Performed at [facility], [place] [address] [place], [place] [address]   SARS Coronavirus 2 (CEPHEID - Performed in [place] hospital lab), Hosp Order     Status: None   Collection Time: 09/09/18  4:35 PM   Specimen: Nasopharyngeal Swab  Result Value Ref Range Status   SARS Coronavirus 2 NEGATIVE NEGATIVE Final    Comment: (NOTE) If result is NEGATIVE SARS-CoV-2 target nucleic acids are NOT DETECTED. The SARS-CoV-2 RNA is generally detectable in upper and lower  respiratory specimens during the acute phase of infection. The lowest  concentration of SARS-CoV-2 viral copies this assay can detect is 250  copies / mL. A negative result does not preclude SARS-CoV-2 infection  and should not be used as the sole basis for treatment or other  patient management decisions.  A negative result may occur with  improper specimen collection / handling, submission of specimen other  than nasopharyngeal swab, presence of viral mutation(s) within the  areas targeted by this assay, and inadequate number of viral copies  (<250 copies / mL). A negative result must be combined with clinical  observations, patient history, and  epidemiological information. If result is POSITIVE SARS-CoV-2 target nucleic acids are DETECTED. The SARS-CoV-2 RNA is generally detectable in upper and lower  respiratory specimens dur ing the acute phase of infection.  Positive  results are indicative of active infection with SARS-CoV-2.  Clinical  correlation with patient history and other diagnostic information is  necessary to determine patient infection status.  Positive results do  not rule out bacterial infection or co-infection with other viruses. If result is PRESUMPTIVE POSTIVE SARS-CoV-2 nucleic acids MAY BE PRESENT.   A presumptive positive result was obtained on the submitted specimen  and confirmed on repeat testing.  While 2019 novel coronavirus  (SARS-CoV-2) nucleic acids may be present in the submitted sample  additional confirmatory testing may be necessary for epidemiological  and /  or clinical management purposes  to differentiate between  SARS-CoV-2 and other Sarbecovirus currently known to infect humans.  If clinically indicated additional testing with an alternate test  methodology [phone]) is advised. The SARS-CoV-2 RNA is generally  detectable in upper and lower respiratory sp ecimens during the acute  phase of infection. The expected result is Negative. Fact Sheet for Patients:  StrictlyIdeas.no Fact Sheet for Healthcare Providers: BankingDealers.co.za This test is not yet approved or cleared by the [place] FDA and has been authorized for detection and/or diagnosis of SARS-CoV-2 by FDA under an Emergency Use Authorization (EUA).  This EUA will remain in effect (meaning this test can be used) for the duration of the COVID-19 declaration under Section 564(b)(1) of the Act, 21 U.S.C. section 360bbb-3(b)(1), unless the authorization is terminated or revoked sooner. Performed at [facility], [place] [address], [place], [place] [address]           Radiology Studies: No results found.      Scheduled Meds: . allopurinol  100 mg Oral Daily  . feeding supplement (ENSURE ENLIVE)  237 mL Oral BID BM  . furosemide  20 mg Oral Q48H  . heparin  5,000 Units Subcutaneous Q8H  . isosorbide mononitrate  30 mg Oral Daily  . megestrol  1,600 mg Oral Daily  . metoprolol tartrate  12.5 mg Oral BID  . pantoprazole  40 mg Oral Daily  . sodium bicarbonate  650 mg Oral TID  . sodium chloride flush  3 mL Intravenous Once  . sodium chloride flush  3 mL Intravenous Q12H  . sodium chloride flush  3 mL Intravenous Q12H   Continuous Infusions: . sodium chloride 10 mL/hr at 09/08/18 1623     LOS: 0 days    Time spent: 15 minutes    Barb Merino, MD [facility] Pager [phone]  If 7PM-7AM, please contact night-coverage [email or web address] Password [facility] 09/12/2018, 10:19 AM

## 2018-09-12 NOTE — Plan of Care (Signed)
Patient stable, discussed POC with patient, agreeable with plan, denies question/concerns at this time.  

## 2018-09-13 DIAGNOSIS — I951 Orthostatic hypotension: Secondary | ICD-10-CM | POA: Diagnosis not present

## 2018-09-13 DIAGNOSIS — D72829 Elevated white blood cell count, unspecified: Secondary | ICD-10-CM | POA: Diagnosis not present

## 2018-09-13 DIAGNOSIS — R2689 Other abnormalities of gait and mobility: Secondary | ICD-10-CM | POA: Diagnosis not present

## 2018-09-13 DIAGNOSIS — I5042 Chronic combined systolic (congestive) and diastolic (congestive) heart failure: Secondary | ICD-10-CM | POA: Diagnosis not present

## 2018-09-13 DIAGNOSIS — Z743 Need for continuous supervision: Secondary | ICD-10-CM | POA: Diagnosis not present

## 2018-09-13 DIAGNOSIS — R0789 Other chest pain: Secondary | ICD-10-CM | POA: Diagnosis not present

## 2018-09-13 DIAGNOSIS — R270 Ataxia, unspecified: Secondary | ICD-10-CM

## 2018-09-13 DIAGNOSIS — D539 Nutritional anemia, unspecified: Secondary | ICD-10-CM | POA: Diagnosis not present

## 2018-09-13 DIAGNOSIS — D531 Other megaloblastic anemias, not elsewhere classified: Secondary | ICD-10-CM | POA: Diagnosis not present

## 2018-09-13 DIAGNOSIS — Z5181 Encounter for therapeutic drug level monitoring: Secondary | ICD-10-CM | POA: Diagnosis not present

## 2018-09-13 DIAGNOSIS — N184 Chronic kidney disease, stage 4 (severe): Secondary | ICD-10-CM | POA: Diagnosis not present

## 2018-09-13 DIAGNOSIS — Z9049 Acquired absence of other specified parts of digestive tract: Secondary | ICD-10-CM | POA: Diagnosis not present

## 2018-09-13 DIAGNOSIS — Z79899 Other long term (current) drug therapy: Secondary | ICD-10-CM | POA: Diagnosis not present

## 2018-09-13 DIAGNOSIS — R279 Unspecified lack of coordination: Secondary | ICD-10-CM | POA: Diagnosis not present

## 2018-09-13 DIAGNOSIS — I5043 Acute on chronic combined systolic (congestive) and diastolic (congestive) heart failure: Secondary | ICD-10-CM | POA: Diagnosis not present

## 2018-09-13 DIAGNOSIS — N189 Chronic kidney disease, unspecified: Secondary | ICD-10-CM | POA: Diagnosis not present

## 2018-09-13 DIAGNOSIS — E538 Deficiency of other specified B group vitamins: Secondary | ICD-10-CM | POA: Diagnosis not present

## 2018-09-13 DIAGNOSIS — N183 Chronic kidney disease, stage 3 (moderate): Secondary | ICD-10-CM

## 2018-09-13 DIAGNOSIS — E875 Hyperkalemia: Secondary | ICD-10-CM | POA: Diagnosis not present

## 2018-09-13 DIAGNOSIS — M6281 Muscle weakness (generalized): Secondary | ICD-10-CM | POA: Diagnosis not present

## 2018-09-13 DIAGNOSIS — R531 Weakness: Secondary | ICD-10-CM | POA: Diagnosis not present

## 2018-09-13 DIAGNOSIS — J8410 Pulmonary fibrosis, unspecified: Secondary | ICD-10-CM | POA: Diagnosis not present

## 2018-09-13 DIAGNOSIS — E440 Moderate protein-calorie malnutrition: Secondary | ICD-10-CM | POA: Diagnosis not present

## 2018-09-13 DIAGNOSIS — N170 Acute kidney failure with tubular necrosis: Secondary | ICD-10-CM | POA: Diagnosis not present

## 2018-09-13 DIAGNOSIS — E872 Acidosis: Secondary | ICD-10-CM | POA: Diagnosis not present

## 2018-09-13 DIAGNOSIS — I130 Hypertensive heart and chronic kidney disease with heart failure and stage 1 through stage 4 chronic kidney disease, or unspecified chronic kidney disease: Secondary | ICD-10-CM | POA: Diagnosis not present

## 2018-09-13 DIAGNOSIS — Z87891 Personal history of nicotine dependence: Secondary | ICD-10-CM | POA: Diagnosis not present

## 2018-09-13 DIAGNOSIS — N390 Urinary tract infection, site not specified: Secondary | ICD-10-CM | POA: Diagnosis not present

## 2018-09-13 LAB — SARS CORONAVIRUS 2 BY RT PCR (HOSPITAL ORDER, PERFORMED IN ~~LOC~~ HOSPITAL LAB): SARS Coronavirus 2: NEGATIVE

## 2018-09-13 NOTE — Progress Notes (Signed)
Pt is discharged with PTAR. Pt alert and oriented. VSS. Given evening dose of med written on discharge summary. All belongings with pt.

## 2018-09-13 NOTE — TOC Transition Note (Signed)
Transition of Care [facility]) - CM/SW Discharge Note   Patient Details  Name: Brent [name] MRN: [id number] Date of Birth: 05/17/1945  [facility] CM/SW Contact:  Geralynn Ochs, LCSW Phone Number: 09/13/2018, 4:16 PM   Clinical Narrative:   Nurse to call report to [phone], [facility]    Final next level of care: [place] Barriers to Discharge: Barriers Resolved   Patient Goals and CMS Choice   CMS Medicare.gov Compare Post Acute Care list provided to:: Patient Choice offered to / list presented to : Patient, Sibling  Discharge Placement              Patient [facility] bed at: [facility] Patient to be transferred to facility by: [place] Name of family member notified: Self Patient and family notified of of transfer: 09/13/18  Discharge Plan and Services In-house Referral: Clinical Social Work Discharge Planning Services: [employer] Consult Post Acute Care Choice: [place]                               Social Determinants of Health (SDOH) Interventions     Readmission Risk Interventions No flowsheet data found.

## 2018-09-13 NOTE — Discharge Summary (Signed)
Physician Discharge Summary  Brent [name] LXB:[id number] DOB: 05-15-1945 DOA: 09/07/2018  PCP: Gayland Curry, DO  Admit date: 09/07/2018 Discharge date: 09/13/2018  Admitted From: Home Disposition: Skilled nursing facility  Recommendations for Outpatient Follow-up:  1. Please obtain BMP/CBC in one week   Home Health: Not applicable Equipment/Devices: Not applicable  Discharge Condition: Stable CODE STATUS: DNR Diet recommendation: Low-salt diet  Brief/Interim Summary: 73 year old male with history of chronic combined systolic and diastolic congestive heart failure, known ejection fraction of 30%, bipolar disorder, Crohn's disease on Remicade and chronic kidney disease stage III with baseline creatinine about 1.7, recent hospitalization for acute CHF was discharged home came back with generalized weakness and difficulty walking.  With significant symptoms and some orthostatic changes he was admitted to the hospital.  Patient is on heart failure regimen with diuretics and was found with positive orthostatic changes and clinical dehydration.  In the emergency room he was afebrile, he was on room air, blood pressures were stable on laying down.  EKG was normal.  Creatinine was 2.76 up from baseline of 1.7.  CT head normal.   Discharge Diagnoses:  Principal Problem:   Acute renal failure superimposed on stage 3 chronic kidney disease (HCC) Active Problems:   Bipolar 1 disorder (HCC)   Chronic combined systolic and diastolic congestive heart failure (HCC)   Macrocytic anemia   Ataxia   Metabolic acidosis   Orthostasis  AKI superimposed on stage III CKD: Prerenal azotemia in the setting of poor appetite, diuresis and low blood pressure.  Good urine output.  -diuretic resumed- will need close monitoring  Chronic combined heart failure: Well compensated and euvolemic  Profound physical debility: With recent hospitalization and acute medical problems.  He will benefit with inpatient  therapies and has been referred to skilled nursing home. Slowly improving during extended hospitalization but will still benefit from  SNF  Macrocytic anemia: Stable.  Crohn's disease: Stable.  hypomagnesemia -replaced  Ataxia/lethargy: Mostly due to advanced debility.  CT head was normal.  Electrolytes are normal.  Valproate level 45, B12, CK and TSH within normal limits.  He will benefit from SNF.  Patient is medically stable to transfer to skilled level of care to continue inpatient therapies.  Discharge Instructions  Discharge Instructions    Diet - low sodium heart healthy   Complete by: As directed    Increase activity slowly   Complete by: As directed      Allergies as of 09/13/2018      Reactions   Azathioprine Other (See Comments)   REACTION: affected WBC   Ciprofloxacin Other (See Comments)   Unknown rxn   Levaquin [levofloxacin In D5w] Other (See Comments)   Unknown rxn   Plendil [felodipine] Other (See Comments)   Unknown rxn      Medication List    TAKE these medications   allopurinol 100 MG tablet Commonly known as: ZYLOPRIM Take 2 tablets (200 mg total) by mouth daily. What changed: how much to take   cholecalciferol 1000 units tablet Commonly known as: VITAMIN D Take 1,000 Units by mouth daily.   divalproex 500 MG DR tablet Commonly known as: DEPAKOTE Take 1,500 mg by mouth at bedtime.   ferrous sulfate 325 (65 FE) MG tablet Take 325 mg by mouth every Monday, Wednesday, and Friday.   furosemide 20 MG tablet Commonly known as: LASIX Take 1 tablet (20 mg total) by mouth 3 (three) times a week. Monday Wednesday and Friday What changed:   when to  take this  additional instructions   hydrALAZINE 10 MG tablet Commonly known as: APRESOLINE Take 1 tablet (10 mg total) by mouth 2 (two) times a day.   inFLIXimab 100 MG injection Commonly known as: Remicade Infuse Remicade IV schedule 1 85m/kg every 8 weeks Premedicate with Tylenol  500-652mby mouth and Benadryl 25-5070my mouth prior to infusion. Last PPD was on 12/2009.   isosorbide mononitrate 30 MG 24 hr tablet Commonly known as: IMDUR Take 1 tablet (30 mg total) by mouth daily.   loperamide 2 MG capsule Commonly known as: IMODIUM Take 4 mg by mouth daily as needed for diarrhea or loose stools.   magnesium gluconate 30 MG tablet Commonly known as: MAGONATE Take 1 tablet (30 mg total) by mouth every Monday, Wednesday, and Friday.   megestrol 40 MG/ML suspension Commonly known as: MEGACE Take 10 mLs (400 mg total) by mouth daily. What changed:   how much to take  Another medication with the same name was removed. Continue taking this medication, and follow the directions you see here.   metoprolol succinate 25 MG 24 hr tablet Commonly known as: Toprol XL Take 0.5 tablets (12.5 mg total) by mouth at bedtime. What changed: Another medication with the same name was removed. Continue taking this medication, and follow the directions you see here.   OLANZapine 7.5 MG tablet Commonly known as: ZyPREXA Take 1 tablet (7.5 mg total) by mouth at bedtime.   omeprazole 20 MG capsule Commonly known as: PRILOSEC Take 20 mg by mouth daily before supper.   polyethylene glycol 17 g packet Commonly known as: MIRALAX / GLYCOLAX Take 17 g by mouth daily as needed for mild constipation.   Procrit 10000 UNIT/ML injection Generic drug: epoetin alfa Inject 10,000 Units into the skin every 30 (thirty) days.      Contact information for after-discharge care    Destination    [facility] .   Service: Skilled Nursing Contact information: [phone]          Allergies  Allergen Reactions  . Azathioprine Other (See Comments)    REACTION: affected WBC  . Ciprofloxacin Other (See Comments)    Unknown rxn  . Levaquin [Levofloxacin In D5w] Other (See Comments)    Unknown rxn   . Plendil [Felodipine] Other (See Comments)    Unknown rxn    Consultations:  None   Procedures/Studies: Dg Chest 2 View  Result Date: 09/07/2018 CLINICAL DATA:  Weakness for 1 day. Ex-smoker. EXAM: CHEST - 2 VIEW COMPARISON:  09/01/2018. FINDINGS: Normal sized heart. Mildly tortuous aorta. Clear lungs. Minimal peribronchial thickening with improvement. Mild thoracic spine degenerative changes. IMPRESSION: Minimal bronchitic changes with improvement. Electronically Signed   By: SteClaudie ReveringD.   On: 09/07/2018 21:16   Dg Chest 2 View  Result Date: 09/01/2018 CLINICAL DATA:  Shortness of breath history of Crohn's disease EXAM: CHEST - 2 VIEW COMPARISON:  August 31, 2018 FINDINGS: The heart size and mediastinal contours are stable. Both lungs are clear. The visualized skeletal structures are unremarkable. IMPRESSION: No active cardiopulmonary disease. Electronically Signed   By: WeiAbelardo DieselD.   On: 09/01/2018 08:25   Dg Chest 2 View  Result Date: 08/21/2018 CLINICAL DATA:  Shortness of breath and wheezing EXAM: CHEST - 2 VIEW COMPARISON:  May 12, 2018 FINDINGS: There is slight scarring in the right upper lobe. There is no evident edema or consolidation. Heart  is upper normal in size with pulmonary vascularity normal. No adenopathy. There is degenerative change in the thoracic spine. IMPRESSION: Slight scarring right upper lobe. No edema or consolidation. Heart upper normal in size. Electronically Signed   By: Lowella Grip III M.D.   On: 08/21/2018 09:58   Ct Head Wo Contrast  Result Date: 09/07/2018 CLINICAL DATA:  Ataxia EXAM: CT HEAD WITHOUT CONTRAST TECHNIQUE: Contiguous axial images were obtained from the base of the skull through the vertex without intravenous contrast. COMPARISON:  02/07/2018 FINDINGS: Brain: Age related volume loss. No acute intracranial abnormality. Specifically, no hemorrhage, hydrocephalus, mass lesion, acute infarction, or significant intracranial injury.  Vascular: No hyperdense vessel or unexpected calcification. Skull: No acute calvarial abnormality. Sinuses/Orbits: Mucosal thickening in the left maxillary sinus. No acute findings. Other: None IMPRESSION: No acute intracranial abnormality. Electronically Signed   By: Rolm Baptise M.D.   On: 09/07/2018 21:55   Ct Chest Wo Contrast  Result Date: 08/31/2018 CLINICAL DATA:  Shortness of breath. Volume overload with heart failure suspected EXAM: CT CHEST WITHOUT CONTRAST TECHNIQUE: Multidetector CT imaging of the chest was performed following the standard protocol without IV contrast. COMPARISON:  Chest x-ray from earlier today and chest CT 05/05/2018 FINDINGS: Cardiovascular: Generous heart size with trace pericardial fluid and/or thickening. Mild coronary calcification. Mediastinum/Nodes: Circumferential prominent esophageal wall thickness but stable and generalized. This thickening is less extensive than seen in 2018. Enlarged lower right periesophageal lymph node that is stable since 2018 at least, presumably reactive and related to the esophagus. Lungs/Pleura: Trace pleural fluid on the right more than left with mild atelectasis. Subpleural reticulation in the upper lobes is scarring when correlated with 2018 CT. Mild emphysema. There is no edema, consolidation, or pneumothorax. Upper Abdomen: Bilateral renal cystic densities. There is cholecystectomy which accounts for prominent bile duct diameter. Musculoskeletal: Degenerative changes without acute finding. IMPRESSION: 1. Trace pleural effusions with atelectasis.  No pulmonary edema. 2. Chronic generalized esophageal wall thickening with mildly enlarged periesophageal lymph node, please correlate for esophagitis symptoms. Electronically Signed   By: Monte Fantasia M.D.   On: 08/31/2018 13:35   Dg Chest Port 1 View  Result Date: 08/31/2018 CLINICAL DATA:  Shortness of breath and wheezing EXAM: PORTABLE CHEST 1 VIEW COMPARISON:  Chest x-ray dated August 20, 2018 FINDINGS: The heart size is enlarged. There are new ground-glass airspace opacities bilaterally. There is no pneumothorax. No large pleural effusion. There is no acute osseous abnormality. IMPRESSION: Cardiomegaly with findings suspicious for developing pulmonary edema. An atypical infectious process can have a similar appearance in the appropriate clinical setting. Electronically Signed   By: Constance Holster M.D.   On: 08/31/2018 02:22      Subjective: Patient seen and examined.  He had no complaints.  He was eager to go for therapies.  Patient stated that he is mobility is improving and he has more balance now.   Discharge Exam: Vitals:   09/13/18 0400 09/13/18 0824  BP: 120/75 93/66  Pulse: 94 (!) 102  Resp: 18 18  Temp:  98.7 F (37.1 C)  SpO2: 98% 100%   Vitals:   09/13/18 0010 09/13/18 0400 09/13/18 0500 09/13/18 0824  BP: 114/70 120/75  93/66  Pulse: 92 94  (!) 102  Resp: 18 18  18   Temp:    98.7 F (37.1 C)  TempSrc:    Oral  SpO2: 100% 98%  100%  Weight:   70.1 kg   Height:  General: Pt is alert, awake, not in acute distress Cardiovascular: RRR, S1/S2 +, no rubs, no gallops Respiratory: CTA bilaterally, no wheezing, no rhonchi Abdominal: Soft, NT, ND, bowel sounds + Extremities: no edema, no cyanosis    The results of significant diagnostics from this hospitalization (including imaging, microbiology, ancillary and laboratory) are listed below for reference.     Microbiology: Recent Results (from the past 240 hour(s))  SARS Coronavirus 2 (CEPHEID - Performed in [place] hospital lab), Hosp Order     Status: None   Collection Time: 09/07/18 10:46 PM   Specimen: Nasopharyngeal Swab  Result Value Ref Range Status   SARS Coronavirus 2 NEGATIVE NEGATIVE Final    Comment: (NOTE) If result is NEGATIVE SARS-CoV-2 target nucleic acids are NOT DETECTED. The SARS-CoV-2 RNA is generally detectable in upper and lower  respiratory specimens during the  acute phase of infection. The lowest  concentration of SARS-CoV-2 viral copies this assay can detect is 250  copies / mL. A negative result does not preclude SARS-CoV-2 infection  and should not be used as the sole basis for treatment or other  patient management decisions.  A negative result may occur with  improper specimen collection / handling, submission of specimen other  than nasopharyngeal swab, presence of viral mutation(s) within the  areas targeted by this assay, and inadequate number of viral copies  (<250 copies / mL). A negative result must be combined with clinical  observations, patient history, and epidemiological information. If result is POSITIVE SARS-CoV-2 target nucleic acids are DETECTED. The SARS-CoV-2 RNA is generally detectable in upper and lower  respiratory specimens dur ing the acute phase of infection.  Positive  results are indicative of active infection with SARS-CoV-2.  Clinical  correlation with patient history and other diagnostic information is  necessary to determine patient infection status.  Positive results do  not rule out bacterial infection or co-infection with other viruses. If result is PRESUMPTIVE POSTIVE SARS-CoV-2 nucleic acids MAY BE PRESENT.   A presumptive positive result was obtained on the submitted specimen  and confirmed on repeat testing.  While 2019 novel coronavirus  (SARS-CoV-2) nucleic acids may be present in the submitted sample  additional confirmatory testing may be necessary for epidemiological  and / or clinical management purposes  to differentiate between  SARS-CoV-2 and other Sarbecovirus currently known to infect humans.  If clinically indicated additional testing with an alternate test  methodology [phone]) is advised. The SARS-CoV-2 RNA is generally  detectable in upper and lower respiratory sp ecimens during the acute  phase of infection. The expected result is Negative. Fact Sheet for Patients:   StrictlyIdeas.no Fact Sheet for Healthcare Providers: BankingDealers.co.za This test is not yet approved or cleared by the [place] FDA and has been authorized for detection and/or diagnosis of SARS-CoV-2 by FDA under an Emergency Use Authorization (EUA).  This EUA will remain in effect (meaning this test can be used) for the duration of the COVID-19 declaration under Section 564(b)(1) of the Act, 21 U.S.C. section 360bbb-3(b)(1), unless the authorization is terminated or revoked sooner. Performed at [facility], [place] [address], [place], [place] [address]   SARS Coronavirus 2 (CEPHEID - Performed in [place] hospital lab), Hosp Order     Status: None   Collection Time: 09/09/18  4:35 PM   Specimen: Nasopharyngeal Swab  Result Value Ref Range Status   SARS Coronavirus 2 NEGATIVE NEGATIVE Final    Comment: (NOTE) If result is NEGATIVE SARS-CoV-2 target  nucleic acids are NOT DETECTED. The SARS-CoV-2 RNA is generally detectable in upper and lower  respiratory specimens during the acute phase of infection. The lowest  concentration of SARS-CoV-2 viral copies this assay can detect is 250  copies / mL. A negative result does not preclude SARS-CoV-2 infection  and should not be used as the sole basis for treatment or other  patient management decisions.  A negative result may occur with  improper specimen collection / handling, submission of specimen other  than nasopharyngeal swab, presence of viral mutation(s) within the  areas targeted by this assay, and inadequate number of viral copies  (<250 copies / mL). A negative result must be combined with clinical  observations, patient history, and epidemiological information. If result is POSITIVE SARS-CoV-2 target nucleic acids are DETECTED. The SARS-CoV-2 RNA is generally detectable in upper and lower  respiratory specimens dur ing the acute phase of infection.  Positive   results are indicative of active infection with SARS-CoV-2.  Clinical  correlation with patient history and other diagnostic information is  necessary to determine patient infection status.  Positive results do  not rule out bacterial infection or co-infection with other viruses. If result is PRESUMPTIVE POSTIVE SARS-CoV-2 nucleic acids MAY BE PRESENT.   A presumptive positive result was obtained on the submitted specimen  and confirmed on repeat testing.  While 2019 novel coronavirus  (SARS-CoV-2) nucleic acids may be present in the submitted sample  additional confirmatory testing may be necessary for epidemiological  and / or clinical management purposes  to differentiate between  SARS-CoV-2 and other Sarbecovirus currently known to infect humans.  If clinically indicated additional testing with an alternate test  methodology [phone]) is advised. The SARS-CoV-2 RNA is generally  detectable in upper and lower respiratory sp ecimens during the acute  phase of infection. The expected result is Negative. Fact Sheet for Patients:  StrictlyIdeas.no Fact Sheet for Healthcare Providers: BankingDealers.co.za This test is not yet approved or cleared by the [place] FDA and has been authorized for detection and/or diagnosis of SARS-CoV-2 by FDA under an Emergency Use Authorization (EUA).  This EUA will remain in effect (meaning this test can be used) for the duration of the COVID-19 declaration under Section 564(b)(1) of the Act, 21 U.S.C. section 360bbb-3(b)(1), unless the authorization is terminated or revoked sooner. Performed at [facility], [place] [address] [place], [place] [address]      Labs: BNP (last 3 results) Recent Labs    05/05/18 0442 05/12/18 0629 08/31/18 0134  BNP 490.9* 1,145.7* 2,924.4*   Basic Metabolic Panel: Recent Labs  Lab 09/07/18 1708 09/08/18 0248 09/09/18 0443 09/12/18 0538  NA 138 139 137  136  K 4.1 3.9 4.4 5.1  CL 112* 113* 111 109  CO2 16* 16* 18* 19*  GLUCOSE 106* 95 102* 100*  BUN 59* 57* 48* 48*  CREATININE 2.76* 2.44* 2.09* 2.25*  CALCIUM 9.3 9.0 8.7* 9.0  MG  --  1.6*  --   --    Liver Function Tests: Recent Labs  Lab 09/08/18 0248 09/09/18 0443  AST 13* 12*  ALT 11 10  ALKPHOS 61 50  BILITOT 0.5 0.4  PROT 6.7 6.3*  ALBUMIN 3.0* 2.7*   No results for input(s): LIPASE, AMYLASE in the last 168 hours. No results for input(s): AMMONIA in the last 168 hours. CBC: Recent Labs  Lab 09/07/18 1028 09/07/18 1708 09/08/18 0248  WBC  --  10.8* 9.0  NEUTROABS  --   --  5.4  HGB 11.3* 11.0* 10.7*  HCT 36.2* 35.8* 33.6*  32.0*  MCV  --  108.5* 104.7*  PLT  --  237 201   Cardiac Enzymes: Recent Labs  Lab 09/08/18 0248  CKTOTAL 82   BNP: Invalid input(s): POCBNP CBG: Recent Labs  Lab 09/09/18 0616 09/10/18 0555 09/11/18 0611 09/12/18 0713  GLUCAP 90 89 94 98   D-Dimer No results for input(s): DDIMER in the last 72 hours. Hgb A1c No results for input(s): HGBA1C in the last 72 hours. Lipid Profile No results for input(s): CHOL, HDL, LDLCALC, TRIG, CHOLHDL, LDLDIRECT in the last 72 hours. Thyroid function studies No results for input(s): TSH, T4TOTAL, T3FREE, THYROIDAB in the last 72 hours.  Invalid input(s): FREET3 Anemia work up No results for input(s): VITAMINB12, FOLATE, FERRITIN, TIBC, IRON, RETICCTPCT in the last 72 hours. Urinalysis    Component Value Date/Time   COLORURINE YELLOW 09/07/2018 2130   APPEARANCEUR CLEAR 09/07/2018 2130   LABSPEC 1.020 09/07/2018 2130   PHURINE 5.0 09/07/2018 2130   GLUCOSEU NEGATIVE 09/07/2018 2130   HGBUR NEGATIVE 09/07/2018 2130   BILIRUBINUR NEGATIVE 09/07/2018 2130   BILIRUBINUR neg 10/03/2014 1152   KETONESUR NEGATIVE 09/07/2018 2130   PROTEINUR NEGATIVE 09/07/2018 2130   UROBILINOGEN negative 10/03/2014 1152   UROBILINOGEN 0.2 07/10/2010 1709   NITRITE NEGATIVE 09/07/2018 2130    LEUKOCYTESUR NEGATIVE 09/07/2018 2130   Sepsis Labs Invalid input(s): PROCALCITONIN,  WBC,  LACTICIDVEN Microbiology Recent Results (from the past 240 hour(s))  SARS Coronavirus 2 (CEPHEID - Performed in [place] hospital lab), Hosp Order     Status: None   Collection Time: 09/07/18 10:46 PM   Specimen: Nasopharyngeal Swab  Result Value Ref Range Status   SARS Coronavirus 2 NEGATIVE NEGATIVE Final    Comment: (NOTE) If result is NEGATIVE SARS-CoV-2 target nucleic acids are NOT DETECTED. The SARS-CoV-2 RNA is generally detectable in upper and lower  respiratory specimens during the acute phase of infection. The lowest  concentration of SARS-CoV-2 viral copies this assay can detect is 250  copies / mL. A negative result does not preclude SARS-CoV-2 infection  and should not be used as the sole basis for treatment or other  patient management decisions.  A negative result may occur with  improper specimen collection / handling, submission of specimen other  than nasopharyngeal swab, presence of viral mutation(s) within the  areas targeted by this assay, and inadequate number of viral copies  (<250 copies / mL). A negative result must be combined with clinical  observations, patient history, and epidemiological information. If result is POSITIVE SARS-CoV-2 target nucleic acids are DETECTED. The SARS-CoV-2 RNA is generally detectable in upper and lower  respiratory specimens dur ing the acute phase of infection.  Positive  results are indicative of active infection with SARS-CoV-2.  Clinical  correlation with patient history and other diagnostic information is  necessary to determine patient infection status.  Positive results do  not rule out bacterial infection or co-infection with other viruses. If result is PRESUMPTIVE POSTIVE SARS-CoV-2 nucleic acids MAY BE PRESENT.   A presumptive positive result was obtained on the submitted specimen  and confirmed on repeat testing.  While  2019 novel coronavirus  (SARS-CoV-2) nucleic acids may be present in the submitted sample  additional confirmatory testing may be necessary for epidemiological  and / or clinical management purposes  to differentiate between  SARS-CoV-2 and other Sarbecovirus currently known to infect humans.  If clinically indicated additional testing with an alternate  test  methodology [phone] is advised. The SARS-CoV-2 RNA is generally  detectable in upper and lower respiratory sp ecimens during the acute  phase of infection. The expected result is Negative. Fact Sheet for Patients:  StrictlyIdeas.no Fact Sheet for Healthcare Providers: BankingDealers.co.za This test is not yet approved or cleared by the [place] FDA and has been authorized for detection and/or diagnosis of SARS-CoV-2 by FDA under an Emergency Use Authorization (EUA).  This EUA will remain in effect (meaning this test can be used) for the duration of the COVID-19 declaration under Section 564(b)(1) of the Act, 21 U.S.C. section 360bbb-3(b)(1), unless the authorization is terminated or revoked sooner. Performed at [facility], [place] [address] [place], [place] [address]   SARS Coronavirus 2 (CEPHEID - Performed in [place] hospital lab), Hosp Order     Status: None   Collection Time: 09/09/18  4:35 PM   Specimen: Nasopharyngeal Swab  Result Value Ref Range Status   SARS Coronavirus 2 NEGATIVE NEGATIVE Final    Comment: (NOTE) If result is NEGATIVE SARS-CoV-2 target nucleic acids are NOT DETECTED. The SARS-CoV-2 RNA is generally detectable in upper and lower  respiratory specimens during the acute phase of infection. The lowest  concentration of SARS-CoV-2 viral copies this assay can detect is 250  copies / mL. A negative result does not preclude SARS-CoV-2 infection  and should not be used as the sole basis for treatment or other  patient management decisions.  A  negative result may occur with  improper specimen collection / handling, submission of specimen other  than nasopharyngeal swab, presence of viral mutation(s) within the  areas targeted by this assay, and inadequate number of viral copies  (<250 copies / mL). A negative result must be combined with clinical  observations, patient history, and epidemiological information. If result is POSITIVE SARS-CoV-2 target nucleic acids are DETECTED. The SARS-CoV-2 RNA is generally detectable in upper and lower  respiratory specimens dur ing the acute phase of infection.  Positive  results are indicative of active infection with SARS-CoV-2.  Clinical  correlation with patient history and other diagnostic information is  necessary to determine patient infection status.  Positive results do  not rule out bacterial infection or co-infection with other viruses. If result is PRESUMPTIVE POSTIVE SARS-CoV-2 nucleic acids MAY BE PRESENT.   A presumptive positive result was obtained on the submitted specimen  and confirmed on repeat testing.  While 2019 novel coronavirus  (SARS-CoV-2) nucleic acids may be present in the submitted sample  additional confirmatory testing may be necessary for epidemiological  and / or clinical management purposes  to differentiate between  SARS-CoV-2 and other Sarbecovirus currently known to infect humans.  If clinically indicated additional testing with an alternate test  methodology [phone]) is advised. The SARS-CoV-2 RNA is generally  detectable in upper and lower respiratory sp ecimens during the acute  phase of infection. The expected result is Negative. Fact Sheet for Patients:  StrictlyIdeas.no Fact Sheet for Healthcare Providers: BankingDealers.co.za This test is not yet approved or cleared by the [place] FDA and has been authorized for detection and/or diagnosis of SARS-CoV-2 by FDA under an Emergency Use  Authorization (EUA).  This EUA will remain in effect (meaning this test can be used) for the duration of the COVID-19 declaration under Section 564(b)(1) of the Act, 21 U.S.C. section 360bbb-3(b)(1), unless the authorization is terminated or revoked sooner. Performed at [facility], [place] [address] [place], [place] [address]  Time coordinating discharge: 35 minutes  SIGNED:   Geradine Girt, DO  [facility] 09/13/2018, 8:48 AM

## 2018-09-13 NOTE — Plan of Care (Signed)
Progressing towards discharge 

## 2018-09-13 NOTE — Progress Notes (Signed)
Physical Therapy Treatment Patient Details Name: Brent [name] MRN: [id number] DOB: Dec 31, 1945 Today's Date: 09/13/2018    History of Present Illness Patient is a 73 y/o male presenting to the ED on 09/07/2018 with primary complaints of general weakness and gait difficulty. Past medical history significant for chronic combined systolic and diastolic CHF, bipolar disorder, Crohn's disease on Remicade, chronic kidney disease stage III, and recent admission with acute CHF, discharged on 09/02/2018. Admitted for continued work-up.    PT Comments    Patient making good progress towards goals. Ambulating in hallway this session without AD - does require close min guard with gait belt and up to [place] for steadying with turns and obstacle navigation. Patient with flexed posture throughout mobility - does still demonstrate some shuffle stepping pattern, however much improved form initial visit. PT to continue to follow.     Follow Up Recommendations  SNF     Equipment Recommendations  Other (comment)(defer)    Recommendations for Other Services       Precautions / Restrictions Precautions Precautions: Fall Precaution Comments: fall history Restrictions Weight Bearing Restrictions: No    Mobility  Bed Mobility               General bed mobility comments: up in recliner  Transfers Overall transfer level: Needs assistance Equipment used: None Transfers: Sit to/from Stand;Stand Pivot Transfers Sit to Stand: Min guard Stand pivot transfers: Min guard       General transfer comment: improved steadiness - close min guard for safety  Ambulation/Gait Ambulation/Gait assistance: Min assist Gait Distance (Feet): (300 total in session) Assistive device: None Gait Pattern/deviations: Step-to pattern;Step-through pattern;Decreased stride length;Shuffle;Trunk flexed;Drifts right/left     General Gait Details: patient wishing to perform mobility without AD this session - requires up to  Min A for safety and stability; flexed posture throughout   Stairs Stairs: Yes Stairs assistance: Min assist Stair Management: Two rails;Step to pattern;Forwards Number of Stairs: 2     Wheelchair Mobility    Modified Rankin (Stroke Patients Only)       Balance Overall balance assessment: Needs assistance Sitting-balance support: No upper extremity supported;Feet supported Sitting balance-Leahy Scale: Good     Standing balance support: No upper extremity supported;During functional activity Standing balance-Leahy Scale: Fair                              Cognition Arousal/Alertness: Awake/alert Behavior During Therapy: WFL for tasks assessed/performed Overall Cognitive Status: Within Functional Limits for tasks assessed                                        Exercises      General Comments        Pertinent Vitals/Pain Pain Assessment: No/denies pain    Home Living                      Prior Function            PT Goals (current goals can now be found in the care plan section) Acute Rehab PT Goals Patient Stated Goal: increase strength PT Goal Formulation: With patient Time For Goal Achievement: 09/22/18 Potential to Achieve Goals: Good Progress towards PT goals: Progressing toward goals    Frequency    Min 3X/week      PT Plan Current plan  remains appropriate    Co-evaluation              AM-PAC PT "6 Clicks" Mobility   Outcome Measure  Help needed turning from your back to your side while in a flat bed without using bedrails?: A Little Help needed moving from lying on your back to sitting on the side of a flat bed without using bedrails?: A Little Help needed moving to and from a bed to a chair (including a wheelchair)?: A Little Help needed standing up from a chair using your arms (e.g., wheelchair or bedside chair)?: A Little Help needed to walk in hospital room?: A Little Help needed climbing  3-5 steps with a railing? : A Little 6 Click Score: 18    End of Session Equipment Utilized During Treatment: Gait belt Activity Tolerance: Patient tolerated treatment well Patient left: in chair;with call bell/phone within reach;with chair alarm set Nurse Communication: Mobility status PT Visit Diagnosis: Unsteadiness on feet (R26.81);Other abnormalities of gait and mobility (R26.89);Muscle weakness (generalized) (M62.81);History of falling (Z91.81)     Time: 0488-8916 PT Time Calculation (min) (ACUTE ONLY): 17 min  Charges:  $Gait Training: 8-22 mins                      Lanney Gins, PT, DPT Supplemental Physical Therapist 09/13/18 9:30 AM Pager: [phone] Office: [phone]

## 2018-09-14 ENCOUNTER — Other Ambulatory Visit: Payer: Self-pay | Admitting: *Deleted

## 2018-09-14 ENCOUNTER — Telehealth: Payer: Self-pay

## 2018-09-14 DIAGNOSIS — N183 Chronic kidney disease, stage 3 (moderate): Secondary | ICD-10-CM | POA: Diagnosis not present

## 2018-09-14 DIAGNOSIS — I5042 Chronic combined systolic (congestive) and diastolic (congestive) heart failure: Secondary | ICD-10-CM | POA: Diagnosis not present

## 2018-09-14 DIAGNOSIS — I130 Hypertensive heart and chronic kidney disease with heart failure and stage 1 through stage 4 chronic kidney disease, or unspecified chronic kidney disease: Secondary | ICD-10-CM | POA: Diagnosis not present

## 2018-09-14 DIAGNOSIS — R0789 Other chest pain: Secondary | ICD-10-CM | POA: Diagnosis not present

## 2018-09-14 NOTE — Telephone Encounter (Signed)
Noted.  Thanks.  I saw he went to [facility].

## 2018-09-14 NOTE — Patient Outreach (Signed)
[facility]) Care Management  09/14/2018  ELYJAH HAZAN 27-Nov-1945 [id number]    Pt discharged from the hospital 09/13/2018 and went to a SNF no longer meeting the criteria for [facility] community case management services. Case will be closed and provider notified accordingly.  Raina Mina, RN Care Management Coordinator [place] Office [phone]

## 2018-09-14 NOTE — Telephone Encounter (Signed)
Patient called today to cancel upcoming appointment with Dr. Mariea Clonts. Patient stated he is in hospital and will be going to rehab for a while. He is unsure when he will get out and needed to cancel appointment. Patient states he does not want to reschedule 4 month follow up right now. He states he will call back.

## 2018-09-15 DIAGNOSIS — D72829 Elevated white blood cell count, unspecified: Secondary | ICD-10-CM | POA: Diagnosis not present

## 2018-09-15 DIAGNOSIS — R0789 Other chest pain: Secondary | ICD-10-CM | POA: Diagnosis not present

## 2018-09-15 DIAGNOSIS — N183 Chronic kidney disease, stage 3 (moderate): Secondary | ICD-10-CM | POA: Diagnosis not present

## 2018-09-15 DIAGNOSIS — E872 Acidosis: Secondary | ICD-10-CM | POA: Diagnosis not present

## 2018-09-16 DIAGNOSIS — E538 Deficiency of other specified B group vitamins: Secondary | ICD-10-CM | POA: Diagnosis not present

## 2018-09-16 DIAGNOSIS — R270 Ataxia, unspecified: Secondary | ICD-10-CM | POA: Diagnosis not present

## 2018-09-16 DIAGNOSIS — N184 Chronic kidney disease, stage 4 (severe): Secondary | ICD-10-CM | POA: Diagnosis not present

## 2018-09-16 DIAGNOSIS — I5042 Chronic combined systolic (congestive) and diastolic (congestive) heart failure: Secondary | ICD-10-CM | POA: Diagnosis not present

## 2018-09-17 DIAGNOSIS — D72829 Elevated white blood cell count, unspecified: Secondary | ICD-10-CM | POA: Diagnosis not present

## 2018-09-17 DIAGNOSIS — E872 Acidosis: Secondary | ICD-10-CM | POA: Diagnosis not present

## 2018-09-20 ENCOUNTER — Ambulatory Visit: Payer: Self-pay | Admitting: Internal Medicine

## 2018-09-20 DIAGNOSIS — E875 Hyperkalemia: Secondary | ICD-10-CM | POA: Diagnosis not present

## 2018-09-20 DIAGNOSIS — I5042 Chronic combined systolic (congestive) and diastolic (congestive) heart failure: Secondary | ICD-10-CM | POA: Diagnosis not present

## 2018-09-20 DIAGNOSIS — D72829 Elevated white blood cell count, unspecified: Secondary | ICD-10-CM | POA: Diagnosis not present

## 2018-09-20 DIAGNOSIS — N183 Chronic kidney disease, stage 3 (moderate): Secondary | ICD-10-CM | POA: Diagnosis not present

## 2018-09-23 ENCOUNTER — Telehealth: Payer: Self-pay

## 2018-09-23 DIAGNOSIS — E440 Moderate protein-calorie malnutrition: Secondary | ICD-10-CM

## 2018-09-23 DIAGNOSIS — N184 Chronic kidney disease, stage 4 (severe): Secondary | ICD-10-CM | POA: Diagnosis not present

## 2018-09-23 DIAGNOSIS — J9601 Acute respiratory failure with hypoxia: Secondary | ICD-10-CM

## 2018-09-23 DIAGNOSIS — R270 Ataxia, unspecified: Secondary | ICD-10-CM

## 2018-09-23 DIAGNOSIS — Z79899 Other long term (current) drug therapy: Secondary | ICD-10-CM | POA: Diagnosis not present

## 2018-09-23 DIAGNOSIS — D631 Anemia in chronic kidney disease: Secondary | ICD-10-CM | POA: Diagnosis not present

## 2018-09-23 DIAGNOSIS — I5042 Chronic combined systolic (congestive) and diastolic (congestive) heart failure: Secondary | ICD-10-CM | POA: Diagnosis not present

## 2018-09-23 DIAGNOSIS — K50919 Crohn's disease, unspecified, with unspecified complications: Secondary | ICD-10-CM | POA: Diagnosis not present

## 2018-09-23 DIAGNOSIS — Z9181 History of falling: Secondary | ICD-10-CM | POA: Diagnosis not present

## 2018-09-23 DIAGNOSIS — I130 Hypertensive heart and chronic kidney disease with heart failure and stage 1 through stage 4 chronic kidney disease, or unspecified chronic kidney disease: Secondary | ICD-10-CM | POA: Diagnosis not present

## 2018-09-23 DIAGNOSIS — H8110 Benign paroxysmal vertigo, unspecified ear: Secondary | ICD-10-CM | POA: Diagnosis not present

## 2018-09-23 DIAGNOSIS — J8410 Pulmonary fibrosis, unspecified: Secondary | ICD-10-CM

## 2018-09-23 NOTE — Telephone Encounter (Signed)
FYI: [name] with [facility] called and states when she went to do start of care she noticed patient heart rate was 116 and blood pressure 120/70. Malachy Mood states patient had not been taking his medication since he had been home, but she made sure he took them while she was there and also started a pill box for him to hopefully help him remember to take his medication.

## 2018-09-23 NOTE — Telephone Encounter (Signed)
Noted.  Glad he has home health.  Looks like he sees me 8/3.

## 2018-09-24 DIAGNOSIS — R270 Ataxia, unspecified: Secondary | ICD-10-CM | POA: Diagnosis not present

## 2018-09-24 DIAGNOSIS — I130 Hypertensive heart and chronic kidney disease with heart failure and stage 1 through stage 4 chronic kidney disease, or unspecified chronic kidney disease: Secondary | ICD-10-CM | POA: Diagnosis not present

## 2018-09-24 DIAGNOSIS — Z9181 History of falling: Secondary | ICD-10-CM | POA: Diagnosis not present

## 2018-09-24 DIAGNOSIS — Z79899 Other long term (current) drug therapy: Secondary | ICD-10-CM | POA: Diagnosis not present

## 2018-09-24 DIAGNOSIS — J8410 Pulmonary fibrosis, unspecified: Secondary | ICD-10-CM | POA: Diagnosis not present

## 2018-09-24 DIAGNOSIS — N184 Chronic kidney disease, stage 4 (severe): Secondary | ICD-10-CM | POA: Diagnosis not present

## 2018-09-24 DIAGNOSIS — H8110 Benign paroxysmal vertigo, unspecified ear: Secondary | ICD-10-CM | POA: Diagnosis not present

## 2018-09-24 DIAGNOSIS — J9601 Acute respiratory failure with hypoxia: Secondary | ICD-10-CM | POA: Diagnosis not present

## 2018-09-24 DIAGNOSIS — D631 Anemia in chronic kidney disease: Secondary | ICD-10-CM | POA: Diagnosis not present

## 2018-09-24 DIAGNOSIS — K50919 Crohn's disease, unspecified, with unspecified complications: Secondary | ICD-10-CM | POA: Diagnosis not present

## 2018-09-24 DIAGNOSIS — I5042 Chronic combined systolic (congestive) and diastolic (congestive) heart failure: Secondary | ICD-10-CM | POA: Diagnosis not present

## 2018-09-24 DIAGNOSIS — E440 Moderate protein-calorie malnutrition: Secondary | ICD-10-CM | POA: Diagnosis not present

## 2018-09-25 HISTORY — PX: CATARACT EXTRACTION, BILATERAL: SHX1313

## 2018-09-27 ENCOUNTER — Telehealth: Payer: Self-pay | Admitting: *Deleted

## 2018-09-27 ENCOUNTER — Ambulatory Visit: Payer: Medicare Other | Admitting: Internal Medicine

## 2018-09-27 DIAGNOSIS — K5190 Ulcerative colitis, unspecified, without complications: Secondary | ICD-10-CM | POA: Diagnosis not present

## 2018-09-27 NOTE — Telephone Encounter (Signed)
Call placed to [facility] left message informing her  Dr Mariea Clonts was notified.

## 2018-09-27 NOTE — Telephone Encounter (Signed)
Brent [name] with [name] called requesting verbal orders for PT 1X1 and 2X5 for balance, strengthening and gait.   Verbal orders given.

## 2018-09-28 DIAGNOSIS — Z961 Presence of intraocular lens: Secondary | ICD-10-CM | POA: Diagnosis not present

## 2018-09-28 DIAGNOSIS — I129 Hypertensive chronic kidney disease with stage 1 through stage 4 chronic kidney disease, or unspecified chronic kidney disease: Secondary | ICD-10-CM | POA: Diagnosis not present

## 2018-09-28 DIAGNOSIS — D631 Anemia in chronic kidney disease: Secondary | ICD-10-CM | POA: Diagnosis not present

## 2018-09-28 DIAGNOSIS — I5022 Chronic systolic (congestive) heart failure: Secondary | ICD-10-CM | POA: Diagnosis not present

## 2018-09-28 DIAGNOSIS — N2581 Secondary hyperparathyroidism of renal origin: Secondary | ICD-10-CM | POA: Diagnosis not present

## 2018-09-28 DIAGNOSIS — N183 Chronic kidney disease, stage 3 (moderate): Secondary | ICD-10-CM | POA: Diagnosis not present

## 2018-09-28 LAB — BASIC METABOLIC PANEL
Creatinine: 2.1 — AB (ref 0.6–1.3)
Potassium: 5.5 — AB (ref 3.4–5.3)

## 2018-09-28 LAB — COMPREHENSIVE METABOLIC PANEL: GFR calc Af Amer: 35

## 2018-09-29 ENCOUNTER — Ambulatory Visit (HOSPITAL_COMMUNITY)
Admission: RE | Admit: 2018-09-29 | Discharge: 2018-09-29 | Disposition: A | Discharge status: Home / Self Care | Payer: Medicare Other | Source: Ambulatory Visit | Attending: Cardiology | Admitting: Cardiology

## 2018-09-29 ENCOUNTER — Encounter (HOSPITAL_COMMUNITY): Payer: Self-pay | Admitting: Cardiology

## 2018-09-29 ENCOUNTER — Other Ambulatory Visit: Payer: Self-pay

## 2018-09-29 ENCOUNTER — Ambulatory Visit (HOSPITAL_BASED_OUTPATIENT_CLINIC_OR_DEPARTMENT_OTHER)
Admission: RE | Admit: 2018-09-29 | Discharge: 2018-09-29 | Disposition: A | Discharge status: Home / Self Care | Payer: Medicare Other | Source: Ambulatory Visit | Attending: Internal Medicine | Admitting: Internal Medicine

## 2018-09-29 VITALS — BP 116/78 | HR 105 | Wt 165.0 lb

## 2018-09-29 DIAGNOSIS — K5090 Crohn's disease, unspecified, without complications: Secondary | ICD-10-CM | POA: Insufficient documentation

## 2018-09-29 DIAGNOSIS — N183 Chronic kidney disease, stage 3 unspecified: Secondary | ICD-10-CM

## 2018-09-29 DIAGNOSIS — I5022 Chronic systolic (congestive) heart failure: Secondary | ICD-10-CM

## 2018-09-29 DIAGNOSIS — F319 Bipolar disorder, unspecified: Secondary | ICD-10-CM | POA: Diagnosis not present

## 2018-09-29 DIAGNOSIS — I130 Hypertensive heart and chronic kidney disease with heart failure and stage 1 through stage 4 chronic kidney disease, or unspecified chronic kidney disease: Secondary | ICD-10-CM | POA: Diagnosis not present

## 2018-09-29 DIAGNOSIS — J449 Chronic obstructive pulmonary disease, unspecified: Secondary | ICD-10-CM | POA: Insufficient documentation

## 2018-09-29 DIAGNOSIS — Z79899 Other long term (current) drug therapy: Secondary | ICD-10-CM | POA: Insufficient documentation

## 2018-09-29 DIAGNOSIS — Z87891 Personal history of nicotine dependence: Secondary | ICD-10-CM | POA: Insufficient documentation

## 2018-09-29 DIAGNOSIS — I5042 Chronic combined systolic (congestive) and diastolic (congestive) heart failure: Secondary | ICD-10-CM | POA: Diagnosis not present

## 2018-09-29 DIAGNOSIS — K900 Celiac disease: Secondary | ICD-10-CM | POA: Insufficient documentation

## 2018-09-29 DIAGNOSIS — M109 Gout, unspecified: Secondary | ICD-10-CM | POA: Diagnosis not present

## 2018-09-29 LAB — BASIC METABOLIC PANEL WITH GFR
Anion gap: 7 (ref 5–15)
BUN: 36 mg/dL — ABNORMAL HIGH (ref 8–23)
CO2: 21 mmol/L — ABNORMAL LOW (ref 22–32)
Calcium: 8.8 mg/dL — ABNORMAL LOW (ref 8.9–10.3)
Chloride: 115 mmol/L — ABNORMAL HIGH (ref 98–111)
Creatinine, Ser: 2.09 mg/dL — ABNORMAL HIGH (ref 0.61–1.24)
GFR calc Af Amer: 35 mL/min — ABNORMAL LOW
GFR calc non Af Amer: 30 mL/min — ABNORMAL LOW
Glucose, Bld: 97 mg/dL (ref 70–99)
Potassium: 5.3 mmol/L — ABNORMAL HIGH (ref 3.5–5.1)
Sodium: 143 mmol/L (ref 135–145)

## 2018-09-29 MED ORDER — METOPROLOL SUCCINATE ER 25 MG PO TB24: 25.0000 mg | ORAL_TABLET | Freq: Every day | ORAL | 11 refills | Status: DC

## 2018-09-29 NOTE — Progress Notes (Signed)
  Echocardiogram 2D Echocardiogram has been performed.  Joscelyn Hardrick L Androw 09/29/2018, 9:25 AM

## 2018-09-29 NOTE — Progress Notes (Signed)
PCP: Gayland Curry, DO Cardiology: Dr. Leandro Reasoner Brent [name] is a 73 y.o. with a history of Crohn's disease, celiac disease,HTN, COPD,priortobacco use, CKD 3, chronic anemia, and bipolar disorder.Diagnosed with systolic HF in 6/81 (echo with EF 30-35%).   Admitted 3/11-3/16/20 with SOB and hypoxia. Found to have PNA and required BIPAP and antibiotics. Blood cultures were negative. Echo showed newly reduced EF 30-35%. Cardiology consulted and he had nuclear stress test, which was read as "high risk" due to low EF. Per Dr Sallyanne Kuster, did not think changes were indicative of coronary disease and cath was not pursued due this and due to CKD. HF meds optimized. Limited with CKD3. He had one short run of NSVT. Also had AKI on CKD3, but creatinine improved to baseline 1.77 on day of discharge.  He was seen in ED 05/12/18 with SOB after misplacing his nebulizer. BNP was elevated, so he was given 40 mg IV lasix and discharged with lasix 20 mg BID. Referred to HF clinic.  He was admitted again in 7/20 with weakness, orthostatic hypotension, and AKI.  Cardiac meds were held.  He was noted to be severely deconditioned, and he was discharged to SNF.  He remained in SNF x 2 wks and is now home.   Echo was done today and reviewed, EF remains 20-25% with mild LVH and normal RV.   He returns today for followup of CHF.  He still gets lightheaded with standing but denies falls.  He is short of breath walking up 1 flight of stairs or walking about 100 feet.  He lives at home alone. No chest pain.  No orthopnea/PND.  He uses a nebulizer COPD, says this helps his dyspnea.   Labs (7/20): K 5.1, creatinine 2.25  ECG (7/20, personally reviewed): NSR, LVH, anterolateral nonspecific T wave inversions.   PMH: 1. Crohns disease: Gets Remicade.  2. Celiac disease.  3. COPD: Prior smoker.  - CT chest (7/20) with mild emphysema.  4. CKD: Stage 3.  5. HTN 6. Bipolar disorder 7. H/o BPPV 8. Gout 9. Chronic systolic  CHF: Etiology uncertain.  - Echo (3/20): EF 30-35%, mild LVH, normal RV.  - Cardiolite (3/20): EF 28%, no ischemia, fixed defects in anteroseptal and inferolateral walls.  - Echo (8/20): EF 20-25%, diffuse hypokinesis, mild LVH, normal RV size and systolic function, normal IVC size.   Social History   Socioeconomic History  . Marital status: Divorced    Spouse name: Not on file  . Number of children: 1  . Years of education: Not on file  . Highest education level: Not on file  Occupational History  . Occupation: retired  . Occupation: [employer]  . Financial resource strain: Not hard at all  . Food insecurity    Worry: Never true    Inability: Never true  . Transportation needs    Medical: No    Non-medical: No  Tobacco Use  . Smoking status: Former Smoker    Packs/day: 1.00    Years: 49.00    Pack years: 49.00    Types: Cigarettes    Start date: 04/11/1956    Quit date: 04/17/2014    Years since quitting: 4.4  . Smokeless tobacco: Never Used  Substance and Sexual Activity  . Alcohol use: No    Alcohol/week: 0.0 standard drinks  . Drug use: No  . Sexual activity: Never  Lifestyle  . Physical activity    Days per week: 0 days  Minutes per session: 0 min  . Stress: Not at all  Relationships  . Social connections    Talks on phone: More than three times a week    Gets together: Three times a week    Attends religious service: More than 4 times per year    Active member of club or organization: Yes    Attends meetings of clubs or organizations: 1 to 4 times per year    Relationship status: Divorced  . Intimate partner violence    Fear of current or ex partner: No    Emotionally abused: No    Physically abused: No    Forced sexual activity: No  Other Topics Concern  . Not on file  Social History Narrative  . Not on file   Family History  Problem Relation Age of Onset  . Diabetes Mother        maternal grandmother  . Uterine cancer Mother   .  Emphysema Father   . Pneumonia Maternal Grandmother   . Colon cancer Neg Hx    ROS: All systems reviewed and negative except as per HPI.   Current Outpatient Medications  Medication Sig Dispense Refill  . allopurinol (ZYLOPRIM) 100 MG tablet Take 2 tablets (200 mg total) by mouth daily. (Patient taking differently: Take 100 mg by mouth daily. ) 60 tablet 5  . cholecalciferol (VITAMIN D) 1000 units tablet Take 1,000 Units by mouth daily.     . divalproex (DEPAKOTE) 500 MG EC tablet Take 1,500 mg by mouth at bedtime.     Marland Kitchen epoetin alfa (PROCRIT) 77824 UNIT/ML injection Inject 10,000 Units into the skin every 30 (thirty) days.     . ferrous sulfate 325 (65 FE) MG tablet Take 325 mg by mouth every Monday, Wednesday, and Friday.     . furosemide (LASIX) 20 MG tablet Take 1 tablet (20 mg total) by mouth 3 (three) times a week. Monday Wednesday and Friday (Patient taking differently: Take 20 mg by mouth every Monday, Wednesday, and Friday. ) 36 tablet 3  . hydrALAZINE (APRESOLINE) 10 MG tablet Take 1 tablet (10 mg total) by mouth 2 (two) times a day. 60 tablet 0  . inFLIXimab (REMICADE) 100 MG injection Infuse Remicade IV schedule 1 44m/kg every 8 weeks Premedicate with Tylenol 500-658mby mouth and Benadryl 25-5033my mouth prior to infusion. Last PPD was on 12/2009.  1 each 6  . isosorbide mononitrate (IMDUR) 30 MG 24 hr tablet Take 1 tablet (30 mg total) by mouth daily. 30 tablet 0  . loperamide (IMODIUM) 2 MG capsule Take 4 mg by mouth daily as needed for diarrhea or loose stools.    . magnesium gluconate (MAGONATE) 30 MG tablet Take 1 tablet (30 mg total) by mouth every Monday, Wednesday, and Friday. 10 tablet 0  . megestrol (MEGACE) 40 MG/ML suspension Take 10 mLs (400 mg total) by mouth daily. 240 mL 0  . metoprolol succinate (TOPROL XL) 25 MG 24 hr tablet Take 1 tablet (25 mg total) by mouth at bedtime. 30 tablet 11  . OLANZapine (ZYPREXA) 7.5 MG tablet Take 1 tablet (7.5 mg total) by mouth  at bedtime. 30 tablet 11  . omeprazole (PRILOSEC) 20 MG capsule Take 20 mg by mouth daily before supper.      . polyethylene glycol (MIRALAX / GLYCOLAX) packet Take 17 g by mouth daily as needed for mild constipation. 14 each 0  . tamsulosin (FLOMAX) 0.4 MG CAPS capsule Take 0.4 mg by mouth daily.  No current facility-administered medications for this encounter.    BP 116/78   Pulse (!) 105   Wt 74.8 kg (165 lb)   SpO2 95%   BMI 20.08 kg/m  General: NAD, thin.  Neck: No JVD, no thyromegaly or thyroid nodule.  Lungs: Clear to auscultation bilaterally with normal respiratory effort. CV: Nondisplaced PMI.  Heart regular S1/S2, no S3/S4, no murmur.  No peripheral edema.  No carotid bruit.  Normal pedal pulses.  Abdomen: Soft, nontender, no hepatosplenomegaly, no distention.  Skin: Intact without lesions or rashes.  Neurologic: Alert and oriented x 3.  Psych: Normal affect. Extremities: No clubbing or cyanosis.  HEENT: Normal.   Assessment/Plan: 1. Chronic systolic CHF: Diagnosed by echo in 3/20, EF 30-35%.  Cardiolite showed fixed defects but no ischemia.  Etiology of cardiomyopathy is uncertain, he has not had cath or MRI due to elevated creatinine. He has no chest pain.  Medication titration has been limited by orthostasis/low BP.  Echo was done today and reviewed, EF 20-25% with mild LVH and normal RV function.  He has NYHA class III symptoms still, think that deconditioning plays a role.  He is not volume overloaded on exam.  Resting sinus tachycardia is concerning for low output.  - Continue current hydralazine/Imdur.  - Increase Toprol XL to 25 mg daily, take in evening before bed.  - Continue Lasix three days/week.  - BMET today, if creatinine is coming down, will try a low dose of losartan next.  If not, probably would add Corlanor.   - If creatinine trends down < 2, will plan for RHC/LHC.  If creatinine remains > 2, in the absence of ACS would just do RHC.  Will discuss this  with him at next appointment.  - He would be a difficult candidate for advanced therapies with deconditioning, renal dysfunction, and lives alone.  2. CKD: Stage 3.  BMET today.  3. COPD: Mild emphysema on prior chest CT.   - Given significant dyspnea, will need to eventually set him up for PFTs.   Followup 2 weeks in pharmacy clinic for med titration, see me in 6 wks.   Loralie Champagne 09/29/2018

## 2018-09-29 NOTE — Patient Instructions (Signed)
Labs done today. We will call only if labs are abnormal.  INCREASE Metoprolol to 42m. Take 1 tablet by mouth daily.   Your physician recommends that you schedule a follow-up appointment with LLattie Hawat the pharmacy clinic in 2-3 weeks.   Your physician recommends that you schedule a follow-up appointment in: 6 weeks with Dr. [facility]undra Dubin [facility] you and your health needs are our priority. As part of our continuing mission to provide you with exceptional heart care, we have created designated Provider Care Teams. These Care Teams include your primary Cardiologist (physician) and Advanced Practice Providers (APPs- Physician Assistants and Nurse Practitioners) who all work together to provide you with the care you need, when you need it.   You may see any of the following providers on your designated Care Team at your next follow up: .Marland KitchenDr DGlori Bickers. Dr DLoralie Champagne. ADarrick Grinder NP   Please be sure to bring in all your medications bottles to every appointment.

## 2018-09-30 ENCOUNTER — Telehealth (HOSPITAL_COMMUNITY): Payer: Self-pay

## 2018-09-30 DIAGNOSIS — J8410 Pulmonary fibrosis, unspecified: Secondary | ICD-10-CM | POA: Diagnosis not present

## 2018-09-30 DIAGNOSIS — D631 Anemia in chronic kidney disease: Secondary | ICD-10-CM | POA: Diagnosis not present

## 2018-09-30 DIAGNOSIS — Z79899 Other long term (current) drug therapy: Secondary | ICD-10-CM | POA: Diagnosis not present

## 2018-09-30 DIAGNOSIS — N184 Chronic kidney disease, stage 4 (severe): Secondary | ICD-10-CM | POA: Diagnosis not present

## 2018-09-30 DIAGNOSIS — I130 Hypertensive heart and chronic kidney disease with heart failure and stage 1 through stage 4 chronic kidney disease, or unspecified chronic kidney disease: Secondary | ICD-10-CM | POA: Diagnosis not present

## 2018-09-30 DIAGNOSIS — R270 Ataxia, unspecified: Secondary | ICD-10-CM | POA: Diagnosis not present

## 2018-09-30 DIAGNOSIS — E440 Moderate protein-calorie malnutrition: Secondary | ICD-10-CM | POA: Diagnosis not present

## 2018-09-30 DIAGNOSIS — H8110 Benign paroxysmal vertigo, unspecified ear: Secondary | ICD-10-CM | POA: Diagnosis not present

## 2018-09-30 DIAGNOSIS — K50919 Crohn's disease, unspecified, with unspecified complications: Secondary | ICD-10-CM | POA: Diagnosis not present

## 2018-09-30 DIAGNOSIS — J9601 Acute respiratory failure with hypoxia: Secondary | ICD-10-CM | POA: Diagnosis not present

## 2018-09-30 DIAGNOSIS — Z9181 History of falling: Secondary | ICD-10-CM | POA: Diagnosis not present

## 2018-09-30 DIAGNOSIS — I5022 Chronic systolic (congestive) heart failure: Secondary | ICD-10-CM

## 2018-09-30 DIAGNOSIS — I5042 Chronic combined systolic (congestive) and diastolic (congestive) heart failure: Secondary | ICD-10-CM | POA: Diagnosis not present

## 2018-09-30 NOTE — Telephone Encounter (Signed)
-----   Message from Larey Dresser, MD sent at 09/29/2018 10:21 PM EDT ----- Creatinine better but K high. Cut back on K in diet, repeat BMET 10 days.

## 2018-09-30 NOTE — Telephone Encounter (Signed)
Relayed message to pt, verbalized understanding and will get repeat labs after his pharm appt 8/17

## 2018-10-01 ENCOUNTER — Telehealth: Payer: Self-pay | Admitting: *Deleted

## 2018-10-01 ENCOUNTER — Ambulatory Visit: Payer: Medicare Other | Admitting: Family

## 2018-10-01 DIAGNOSIS — R270 Ataxia, unspecified: Secondary | ICD-10-CM | POA: Diagnosis not present

## 2018-10-01 DIAGNOSIS — I130 Hypertensive heart and chronic kidney disease with heart failure and stage 1 through stage 4 chronic kidney disease, or unspecified chronic kidney disease: Secondary | ICD-10-CM | POA: Diagnosis not present

## 2018-10-01 DIAGNOSIS — Z79899 Other long term (current) drug therapy: Secondary | ICD-10-CM | POA: Diagnosis not present

## 2018-10-01 DIAGNOSIS — N184 Chronic kidney disease, stage 4 (severe): Secondary | ICD-10-CM | POA: Diagnosis not present

## 2018-10-01 DIAGNOSIS — K50919 Crohn's disease, unspecified, with unspecified complications: Secondary | ICD-10-CM | POA: Diagnosis not present

## 2018-10-01 DIAGNOSIS — J9601 Acute respiratory failure with hypoxia: Secondary | ICD-10-CM | POA: Diagnosis not present

## 2018-10-01 DIAGNOSIS — E440 Moderate protein-calorie malnutrition: Secondary | ICD-10-CM | POA: Diagnosis not present

## 2018-10-01 DIAGNOSIS — J8410 Pulmonary fibrosis, unspecified: Secondary | ICD-10-CM | POA: Diagnosis not present

## 2018-10-01 DIAGNOSIS — Z9181 History of falling: Secondary | ICD-10-CM | POA: Diagnosis not present

## 2018-10-01 DIAGNOSIS — I5042 Chronic combined systolic (congestive) and diastolic (congestive) heart failure: Secondary | ICD-10-CM | POA: Diagnosis not present

## 2018-10-01 DIAGNOSIS — D631 Anemia in chronic kidney disease: Secondary | ICD-10-CM | POA: Diagnosis not present

## 2018-10-01 DIAGNOSIS — H8110 Benign paroxysmal vertigo, unspecified ear: Secondary | ICD-10-CM | POA: Diagnosis not present

## 2018-10-01 NOTE — Telephone Encounter (Signed)
Dionka with [name] called and stated that she is working with patient with PT and she is reporting abnormal heart rate ranging from 54-123 with activity.  All other vitals including SpO2 are within normal limit. Patient just complains of feeling tired. Stated she was just reporting.

## 2018-10-01 NOTE — Telephone Encounter (Signed)
LMOM for Brent [name] to return call.

## 2018-10-01 NOTE — Telephone Encounter (Signed)
Nurse stated that the pulse was irregular today. Patient did go see his Cardiologist on Tuesday. Stated that she will check with her office about contacting the Cardiologist office also.

## 2018-10-01 NOTE — Telephone Encounter (Signed)
I know he missed his follow-up with me because he had another appt at the same time.  I don't see where it got rescheduled.  I wonder if the pulse was regular?  We don't need him winding back up in the hospital with uncontrolled afib now.

## 2018-10-04 ENCOUNTER — Ambulatory Visit: Payer: Medicare Other | Admitting: Internal Medicine

## 2018-10-04 ENCOUNTER — Encounter: Payer: Self-pay | Admitting: Internal Medicine

## 2018-10-04 ENCOUNTER — Other Ambulatory Visit: Payer: Self-pay

## 2018-10-04 ENCOUNTER — Ambulatory Visit (INDEPENDENT_AMBULATORY_CARE_PROVIDER_SITE_OTHER): Payer: Medicare Other | Admitting: Internal Medicine

## 2018-10-04 VITALS — BP 140/60 | HR 59 | Temp 97.9°F | Ht 76.0 in | Wt 170.0 lb

## 2018-10-04 DIAGNOSIS — R630 Anorexia: Secondary | ICD-10-CM

## 2018-10-04 DIAGNOSIS — N179 Acute kidney failure, unspecified: Secondary | ICD-10-CM

## 2018-10-04 DIAGNOSIS — N3281 Overactive bladder: Secondary | ICD-10-CM

## 2018-10-04 DIAGNOSIS — N183 Chronic kidney disease, stage 3 unspecified: Secondary | ICD-10-CM

## 2018-10-04 DIAGNOSIS — I5042 Chronic combined systolic (congestive) and diastolic (congestive) heart failure: Secondary | ICD-10-CM | POA: Diagnosis not present

## 2018-10-04 DIAGNOSIS — I499 Cardiac arrhythmia, unspecified: Secondary | ICD-10-CM

## 2018-10-04 DIAGNOSIS — D631 Anemia in chronic kidney disease: Secondary | ICD-10-CM

## 2018-10-04 DIAGNOSIS — F319 Bipolar disorder, unspecified: Secondary | ICD-10-CM

## 2018-10-04 MED ORDER — FUROSEMIDE 20 MG PO TABS: 20.0000 mg | ORAL_TABLET | ORAL | 3 refills | Status: DC

## 2018-10-04 NOTE — Progress Notes (Signed)
Location:  [facility] clinic Provider: Liseth Wann L. Mariea Clonts, D.O., C.M.D.  Code Status: DNR Goals of Care:  Advanced Directives 09/08/2018  Does Patient Have a Medical Advance Directive? Yes  Type of Advance Directive Out of facility DNR (pink MOST or yellow form)  Does patient want to make changes to medical advance directive? Yes (Inpatient - patient defers changing a medical advance directive at this time - Information given)  Copy of [address] in Chart? -  Would patient like information on creating a medical advance directive? No - Patient declined  Pre-existing out of facility DNR order (yellow form or pink MOST form) Physician notified to receive inpatient order   Chief Complaint  Patient presents with  . Medical Management of Chronic Issues    discharge from [facility]    HPI: Patient is a 73 y.o. male with h/o Crohn's disease, bipolar, macrocytic anemia, seen today for hospital follow-up s/p admission from 7/14-7/20/20 with generalized weakness and difficulty walking--he was orthostatic and dehydrated after just being hospitalized prior to that with acute on chronic combined CHF and being started on diuretics. His cr was up to 2.76 from 1.7.  CT head and EKG were normal.  He'd also had poor appetite.  After his rehydration at the hospital, he was sent to [facility] for PT, OT.  He left there last Tuesday.  He now has had home health with [facility] and PT called to let us know his pulse was irregular between 54 and 123 on Friday afternoon.  Other vitals were normal and he felt fatigued.    Note, during his prior hospitalization he was started on megace for his appetite.  He is eating three meals a day now.  His feet remain swollen, but ankles are not.    He is having urinary frequency and urgency. Neither oxybutynin nor myrbetriq worked for him.  We reviewed that the lasix is now making that problem worse due to its diuretic effect.  He also saw Dr. Posey Pronto at [facility] and his lasix 30m was increased from 3 days a week to every other day (one additional dose) due to some residual edema in his feet on 09/28/18.    He is continuing to be short of breath with minimal exertion.  He admits he had one episode of chest pain when he was in rehab [facility], but it was relieved with two tylenol tablets and has never recurred.      Echo on 8/5 showed depressed EF of 20-25% even vs. Already low EF of 30-35% in March of this year.  That's when he followed up with Dr. MAundra Dubinin the CHF clinic.  HR was NSR on his last EKG.  Today, he's dyspneic, wt up 5 lbs from last week and heart rate irregular on my physical exam.  He is breathing hard at rest.  He admits to dyspnea walking only a few feet at this point.  Optimizing his CHF regimen is challenging with his CKD and anemia in the background.  He's been adherent with his medications and taking his blood pressures and weights at home.  He says he was not told at which point to call if his weight goes up.  We reviewed parameters and I put them in his AVS.  I note his weight changes: 7/9 147 lbs 7/20 154 lbs 8[facility]5 [facility]   Past Medical History:  Diagnosis Date  . Anemia   .  Anxiety   . Benign paroxysmal positional vertigo   . Bipolar I disorder, most recent episode (or current) unspecified   . Celiac disease   . Chronic kidney disease, stage III (moderate) (HCC)   . Crohn's    Remicade q8 weeks  . Depression   . Essential and other specified forms of tremor    medication-induced Parkinson's, now resolved  . Essential hypertension, benign   . Gout 2018  . Hypertrophy of prostate without urinary obstruction and other lower urinary tract symptoms (LUTS)   . Impotence of organic origin   . Insomnia with sleep apnea, unspecified   . Iron deficiency anemia, unspecified   . 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 Neuralgia, neuritis, and radiculitis, unspecified   . Other B-complex  deficiencies   . Other extrapyramidal disease and abnormal movement disorder   . Postinflammatory pulmonary fibrosis ([place])   . Tobacco use disorder   . Vertigo 2018    Past Surgical History:  Procedure Laterality Date  . CHOLECYSTECTOMY  07-12-2010  . SMALL INTESTINE SURGERY     x 2    Allergies  Allergen Reactions  . Azathioprine Other (See Comments)    REACTION: affected WBC  . Ciprofloxacin Other (See Comments)    Unknown rxn  . Levaquin [Levofloxacin In D5w] Other (See Comments)    Unknown rxn  . Plendil [Felodipine] Other (See Comments)    Unknown rxn    Outpatient Encounter Medications as of 10/04/2018  Medication Sig  . allopurinol (ZYLOPRIM) 100 MG tablet Take 2 tablets (200 mg total) by mouth daily.  . cholecalciferol (VITAMIN D) 1000 units tablet Take 1,000 Units by mouth daily.   . divalproex (DEPAKOTE) 500 MG EC tablet Take 1,500 mg by mouth at bedtime.   Marland Kitchen epoetin alfa (PROCRIT) 33295 UNIT/ML injection Inject 10,000 Units into the skin every 30 (thirty) days.   . ferrous sulfate 325 (65 FE) MG tablet Take 325 mg by mouth every Monday, Wednesday, and Friday.   . furosemide (LASIX) 20 MG tablet Take 1 tablet (20 mg total) by mouth 3 (three) times a week. Monday Wednesday and Friday  . inFLIXimab (REMICADE) 100 MG injection Infuse Remicade IV schedule 1 76m/kg every 8 weeks Premedicate with Tylenol 500-6571mby mouth and Benadryl 25-5034my mouth prior to infusion. Last PPD was on 12/2009.   . isosorbide mononitrate (IMDUR) 30 MG 24 hr tablet Take 1 tablet (30 mg total) by mouth daily.  . lMarland Kitchenperamide (IMODIUM) 2 MG capsule Take 4 mg by mouth daily as needed for diarrhea or loose stools.  . magnesium gluconate (MAGONATE) 30 MG tablet Take 1 tablet (30 mg total) by mouth every Monday, Wednesday, and Friday.  . megestrol (MEGACE) 40 MG/ML suspension Take 10 mLs (400 mg total) by mouth daily.  . metoprolol succinate (TOPROL XL) 25 MG 24 hr tablet Take 1 tablet (25 mg  total) by mouth at bedtime.  . OMarland KitchenANZapine (ZYPREXA) 7.5 MG tablet Take 1 tablet (7.5 mg total) by mouth at bedtime.  . oMarland Kitcheneprazole (PRILOSEC) 20 MG capsule Take 20 mg by mouth daily before supper.    . polyethylene glycol (MIRALAX / GLYCOLAX) packet Take 17 g by mouth daily as needed for mild constipation.  . tamsulosin (FLOMAX) 0.4 MG CAPS capsule Take 0.4 mg by mouth daily.  . hydrALAZINE (APRESOLINE) 10 MG tablet Take 1 tablet (10 mg total) by mouth 2 (two) times a day.   No facility-administered encounter medications on file as of 10/04/2018.     Review  of Systems:  Review of Systems  Constitutional: Positive for malaise/fatigue. Negative for chills, fever and weight loss.       Weight gain  HENT: Positive for hearing loss. Negative for congestion and sore throat.   Eyes:       Glasses  Respiratory: Positive for shortness of breath. Negative for cough, sputum production and wheezing.   Cardiovascular: Positive for orthopnea. Negative for chest pain, palpitations, claudication and PND.       Foot swelling, but not legs  Gastrointestinal: Negative for abdominal pain, blood in stool, constipation, diarrhea and melena.       Crohn's under control  Genitourinary: Positive for frequency and urgency. Negative for dysuria, flank pain and hematuria.       Worse since hospitalizations and lasix addition  Musculoskeletal: Negative for falls, joint pain and myalgias.  Skin: Negative for itching and rash.  Neurological: Positive for weakness. Negative for dizziness and loss of consciousness.  Endo/Heme/Allergies: Does not bruise/bleed easily.  Psychiatric/Behavioral: Negative for depression and memory loss. The patient is not nervous/anxious and does not have insomnia.     Health Maintenance  Topic Date Due  . INFLUENZA VACCINE  09/25/2018  . Hepatitis C Screening  01/29/2019 (Originally [birth year])  . TETANUS/TDAP  05/25/2021  . COLONOSCOPY  09/17/2027  . PNA vac Low Risk Adult  Completed     Physical Exam: Vitals:   10/04/18 1334  BP: 140/60  Pulse: (!) 59  Temp: 97.9 F (36.6 C)  TempSrc: Oral  SpO2: 97%  Weight: 170 lb (77.1 kg)  Height: 6' 4"  (1.93 m)   Body mass index is 20.69 kg/m. Physical Exam Vitals signs reviewed.  Constitutional:      Appearance: He is not toxic-appearing or diaphoretic.     Comments: Appears slightly dyspneic at rest, not tachypneic; tall and very thin  HENT:     Head: Normocephalic and atraumatic.  Eyes:     Extraocular Movements: Extraocular movements intact.     Pupils: Pupils are equal, round, and reactive to light.     Comments: glasses  Neck:     Musculoskeletal: Neck supple.  Cardiovascular:     Rate and Rhythm: Rhythm irregular.     Heart sounds: No murmur. No friction rub. No gallop.   Pulmonary:     Breath sounds: No rales.     Comments: Slight increased effort at rest and with minimal exertion; slightly diminished breath sounds at bases but no rales audible Abdominal:     General: Bowel sounds are normal. There is no distension.     Palpations: Abdomen is soft.     Tenderness: There is no abdominal tenderness. There is no guarding or rebound.  Musculoskeletal: Normal range of motion.     Comments: Nonpitting edema of both feet, but none in ankles; does not use assistive device  Skin:    General: Skin is warm and dry.     Capillary Refill: Capillary refill takes less than 2 seconds.  Neurological:     General: No focal deficit present.     Mental Status: He is alert and oriented to person, place, and time. Mental status is at baseline.     Motor: Weakness present.     Gait: Gait normal.     Comments: Stooped posture  Psychiatric:        Mood and Affect: Mood normal.     Comments: Pleasant, appropriate     Labs reviewed: Basic Metabolic Panel: Recent Labs    05/07/18  3545 05/08/18 0256 05/09/18 0251 05/10/18 0330  09/01/18 6256 09/02/18 0514  09/08/18 0248 09/09/18 0443 09/12/18 0538  09/29/18 1108  NA 141 141 143 142   < > 138 139   < > 139 137 136 143  K 3.9 3.9 3.9 4.1   < > 4.5 4.0   < > 3.9 4.4 5.1 5.3*  CL 108 108 112* 112*   < > 111 110   < > 113* 111 109 115*  CO2 25 26 27 25    < > 15* 15*   < > 16* 18* 19* 21*  GLUCOSE 100* 99 95 86   < > 100* 84   < > 95 102* 100* 97  BUN 25* 28* 26* 20   < > 51* 67*   < > 57* 48* 48* 36*  CREATININE 1.90* 2.13* 1.86* 1.82*   < > 2.42* 2.46*   < > 2.44* 2.09* 2.25* 2.09*  CALCIUM 8.6* 8.5* 8.1* 8.4*   < > 7.3* 7.6*   < > 9.0 8.7* 9.0 8.8*  MG 1.4* 1.9 1.8 1.8  --  1.0* 1.0*  --  1.6*  --   --   --   PHOS 2.7 3.0 3.1 3.0  --   --   --   --   --   --   --   --   TSH 0.202*  --   --   --   --  0.379  --   --  0.744  --   --   --    < > = values in this interval not displayed.   Liver Function Tests: Recent Labs    09/02/18 0514 09/08/18 0248 09/09/18 0443  AST 21 13* 12*  ALT 20 11 10   ALKPHOS 70 61 50  BILITOT 0.2* 0.5 0.4  PROT 7.6 6.7 6.3*  ALBUMIN 3.6 3.0* 2.7*   No results for input(s): LIPASE, AMYLASE in the last 8760 hours. No results for input(s): AMMONIA in the last 8760 hours. CBC: Recent Labs    08/31/18 0134 09/02/18 0514 09/07/18 1028 09/07/18 1708 09/08/18 0248  WBC 12.3* 11.0*  --  10.8* 9.0  NEUTROABS 10.3* 6.6  --   --  5.4  HGB 9.7* 10.9* 11.3* 11.0* 10.7*  HCT 31.5* 35.3* 36.2* 35.8* 33.6*  32.0*  MCV 109.4* 106.3*  --  108.5* 104.7*  PLT 199 247  --  237 201   Lipid Panel: No results for input(s): CHOL, HDL, LDLCALC, TRIG, CHOLHDL, LDLDIRECT in the last 8760 hours. Lab Results  Component Value Date   HGBA1C 4.9 08/10/2015    Procedures since last visit: Reviewed CXRs, CT chest, EKGs, CT head, labs  Assessment/Plan 1. Irregular heartbeat -newly noted Friday, today in patient with dyspnea and fatigue -not in afib; prior EKGs sinus - EKG 12-Lead was sent to cardiology and nephrology with message about pt's current condition -weight up also, but yet euvolemic in terms of his  lungs, legs so suspect fluid weight from megace  2. Chronic combined systolic and diastolic congestive heart failure (HCC) -now on lasix every other day as per nephrology, Dr. Posey Pronto -pt still with considerable dyspnea at rest and exertion, but is dealing with EF on 20-25% so may be his new baseline?  I had not had the opportunity to see him myself since his first hospitalization with the CHF diagnosis so not entirely clear if he's worse now than at that time clinically -I'm concerned about his overall prognosis  with such systolic dysfunction -I left his lasix the same -keep f/u lab and visit with CHF clinic--sent a message to Dr. [name] about current state and are there any other options to optimize him medically or next steps?  Limited by renal disease -counseled pt on parameters to call with weight changes  3. Chronic kidney disease (CKD), stage III (moderate) (HCC) -Avoid nephrotoxic agents like nsaids, dose adjust renally excreted meds, hydrate (as able with chf and fluid restrictions)  4. Anemia in stage 3 chronic kidney disease ([place]) -is on procrit thru nephrology for this as well as iron supplementation to maintain his h/h -also contributes to his dyspnea -last hgb I see was 11 though which is fairly good  5. Loss of appetite for more than 2 weeks -he was started on megace at some point since he and I last met -I'm concerned he's at risk of a PE with this on board and that it's causing him to gain only fluid weight -he is having improved appetite also which could increase weight though -advised pt to stop megace and monitor his appetite and keep me posted -megace would only be a temporary fix for this problem either way -remeron not a good option with his CKD  6. Overactive bladder -worsened by diuretic therapy -did not tolerate oxybutynin and myrbetriq did not work for him -discussed diuretic effect and regular voiding to prevent urgency -due to some bph--on flomax  7. Bipolar  1 disorder ([place]) -ongoing, is taking meds, followed by [facility] psychiatrist long-term, continues on depakote  8. AKI (acute kidney injury) ([place]) -due to poor intake in context of new diuretics  Labs/tests ordered:  EKG Next appt:  11/15/2018 f/u chf and appetite  Donal Lynam L. Nechemia Chiappetta, D.O. [facility] [address], [place] [address] Cell Phone (Mon-Fri 8am-5pm):  [phone] On Call:  [phone] & follow prompts after 5pm & weekends Office Phone:  [phone] Office Fax:  [phone]

## 2018-10-04 NOTE — Telephone Encounter (Signed)
Fortunately, he has an appointment today.

## 2018-10-04 NOTE — Patient Instructions (Addendum)
If your weight goes up 2-3 lbs in one day or 5 lbs in one week, let me know right away.  This is especially true if you feel more short of breath or notice you are getting swollen.    Stop the megace.  I'm concerned it will cause you to get a blood clot.  Let me know if your appetite goes downhill.

## 2018-10-05 DIAGNOSIS — N184 Chronic kidney disease, stage 4 (severe): Secondary | ICD-10-CM | POA: Diagnosis not present

## 2018-10-05 DIAGNOSIS — Z9181 History of falling: Secondary | ICD-10-CM | POA: Diagnosis not present

## 2018-10-05 DIAGNOSIS — Z79899 Other long term (current) drug therapy: Secondary | ICD-10-CM | POA: Diagnosis not present

## 2018-10-05 DIAGNOSIS — K50919 Crohn's disease, unspecified, with unspecified complications: Secondary | ICD-10-CM | POA: Diagnosis not present

## 2018-10-05 DIAGNOSIS — J9601 Acute respiratory failure with hypoxia: Secondary | ICD-10-CM | POA: Diagnosis not present

## 2018-10-05 DIAGNOSIS — I130 Hypertensive heart and chronic kidney disease with heart failure and stage 1 through stage 4 chronic kidney disease, or unspecified chronic kidney disease: Secondary | ICD-10-CM | POA: Diagnosis not present

## 2018-10-05 DIAGNOSIS — H8110 Benign paroxysmal vertigo, unspecified ear: Secondary | ICD-10-CM | POA: Diagnosis not present

## 2018-10-05 DIAGNOSIS — R270 Ataxia, unspecified: Secondary | ICD-10-CM | POA: Diagnosis not present

## 2018-10-05 DIAGNOSIS — I5042 Chronic combined systolic (congestive) and diastolic (congestive) heart failure: Secondary | ICD-10-CM | POA: Diagnosis not present

## 2018-10-05 DIAGNOSIS — D631 Anemia in chronic kidney disease: Secondary | ICD-10-CM | POA: Diagnosis not present

## 2018-10-05 DIAGNOSIS — J8410 Pulmonary fibrosis, unspecified: Secondary | ICD-10-CM | POA: Diagnosis not present

## 2018-10-05 DIAGNOSIS — E440 Moderate protein-calorie malnutrition: Secondary | ICD-10-CM | POA: Diagnosis not present

## 2018-10-06 ENCOUNTER — Telehealth: Payer: Self-pay | Admitting: *Deleted

## 2018-10-06 DIAGNOSIS — N184 Chronic kidney disease, stage 4 (severe): Secondary | ICD-10-CM | POA: Diagnosis not present

## 2018-10-06 DIAGNOSIS — J9601 Acute respiratory failure with hypoxia: Secondary | ICD-10-CM | POA: Diagnosis not present

## 2018-10-06 DIAGNOSIS — J8410 Pulmonary fibrosis, unspecified: Secondary | ICD-10-CM | POA: Diagnosis not present

## 2018-10-06 DIAGNOSIS — I130 Hypertensive heart and chronic kidney disease with heart failure and stage 1 through stage 4 chronic kidney disease, or unspecified chronic kidney disease: Secondary | ICD-10-CM | POA: Diagnosis not present

## 2018-10-06 DIAGNOSIS — I5042 Chronic combined systolic (congestive) and diastolic (congestive) heart failure: Secondary | ICD-10-CM | POA: Diagnosis not present

## 2018-10-06 DIAGNOSIS — Z79899 Other long term (current) drug therapy: Secondary | ICD-10-CM | POA: Diagnosis not present

## 2018-10-06 DIAGNOSIS — Z9181 History of falling: Secondary | ICD-10-CM | POA: Diagnosis not present

## 2018-10-06 DIAGNOSIS — D631 Anemia in chronic kidney disease: Secondary | ICD-10-CM | POA: Diagnosis not present

## 2018-10-06 DIAGNOSIS — H8110 Benign paroxysmal vertigo, unspecified ear: Secondary | ICD-10-CM | POA: Diagnosis not present

## 2018-10-06 DIAGNOSIS — K50919 Crohn's disease, unspecified, with unspecified complications: Secondary | ICD-10-CM | POA: Diagnosis not present

## 2018-10-06 DIAGNOSIS — R270 Ataxia, unspecified: Secondary | ICD-10-CM | POA: Diagnosis not present

## 2018-10-06 DIAGNOSIS — E440 Moderate protein-calorie malnutrition: Secondary | ICD-10-CM | POA: Diagnosis not present

## 2018-10-06 NOTE — Telephone Encounter (Signed)
Tammy with [name] called and stated patient's Heart Rate is 109 at rest. Stated she had to report.  All other vitals are Normal. Patient feels fine per nurse. Please Advise.

## 2018-10-07 ENCOUNTER — Non-Acute Institutional Stay (HOSPITAL_COMMUNITY): Admission: AD | Admit: 2018-10-07 | Payer: Medicare Other | Source: Ambulatory Visit | Admitting: Internal Medicine

## 2018-10-07 ENCOUNTER — Ambulatory Visit (HOSPITAL_COMMUNITY)
Admission: RE | Admit: 2018-10-07 | Discharge: 2018-10-07 | Disposition: A | Discharge status: Home / Self Care | Payer: Medicare Other | Source: Ambulatory Visit | Attending: Nephrology | Admitting: Nephrology

## 2018-10-07 ENCOUNTER — Other Ambulatory Visit: Payer: Self-pay

## 2018-10-07 DIAGNOSIS — N183 Chronic kidney disease, stage 3 (moderate): Secondary | ICD-10-CM | POA: Diagnosis not present

## 2018-10-07 DIAGNOSIS — D631 Anemia in chronic kidney disease: Secondary | ICD-10-CM | POA: Insufficient documentation

## 2018-10-07 LAB — FERRITIN: Ferritin: 840 ng/mL — ABNORMAL HIGH (ref 24–336)

## 2018-10-07 LAB — IRON AND TIBC
Iron: 52 ug/dL (ref 45–182)
Saturation Ratios: 17 % — ABNORMAL LOW (ref 17.9–39.5)
TIBC: 303 ug/dL (ref 250–450)
UIBC: 251 ug/dL

## 2018-10-07 LAB — HEMOGLOBIN AND HEMATOCRIT, BLOOD
HCT: 32 % — ABNORMAL LOW (ref 39.0–52.0)
Hemoglobin: 9.7 g/dL — ABNORMAL LOW (ref 13.0–17.0)

## 2018-10-07 MED ORDER — NON FORMULARY: 10000.0000 [IU] | Status: DC

## 2018-10-07 MED ORDER — EPOETIN ALFA 10000 UNIT/ML IJ SOLN
10000.0000 [IU] | INTRAMUSCULAR | Status: DC
  Administered 2018-10-07: 10000 [IU] via SUBCUTANEOUS
  Filled 2018-10-07: qty 1

## 2018-10-07 MED ORDER — EPOETIN ALFA 10000 UNIT/ML IJ SOLN
10000.0000 [IU] | INTRAMUSCULAR | Status: DC
  Filled 2018-10-07: qty 1

## 2018-10-07 NOTE — Progress Notes (Signed)
PATIENT CARE CENTER NOTE  Diagnosis:Anemia associated with Chronic Renal Failure, anemia associated with renal disease  Provider:Patel, Ulice Dash MD   Procedure:Epoetin Alfa (Procrit) injection   Note:Patient received sub-qProcritinjection in left arm. Tolerated well. Labs drawn pre-injection and Hemoglobin was 9.7. Patient BP was122/74.Discharge instructions given. Patient alert, oriented and ambulatory at discharge.

## 2018-10-07 NOTE — Telephone Encounter (Signed)
LMOM for Tammy with [name] to return call.

## 2018-10-07 NOTE — Telephone Encounter (Signed)
Brent [name] with [name] will reach out to patient's Cardiologist with Concerns

## 2018-10-07 NOTE — Addendum Note (Signed)
Addended by: Hollace Kinnier L on: 10/07/2018 01:50 PM   Modules accepted: Level of Service

## 2018-10-07 NOTE — Telephone Encounter (Signed)
He's having some sick sinus syndrome it sounds like as his heart rate has been fluctuating considerably from tachy to brady.  He's being followed by the CHF clinic.  His heart is not pumping well either which causes his dyspnea.  I asked about further recommendations from cardiology and none were initially offered.  If this persists, he will need to follow-up in cardiology clinic.

## 2018-10-07 NOTE — Discharge Instructions (Signed)

## 2018-10-08 ENCOUNTER — Encounter (HOSPITAL_COMMUNITY): Payer: Medicare Other

## 2018-10-09 DIAGNOSIS — K50919 Crohn's disease, unspecified, with unspecified complications: Secondary | ICD-10-CM | POA: Diagnosis not present

## 2018-10-09 DIAGNOSIS — R270 Ataxia, unspecified: Secondary | ICD-10-CM | POA: Diagnosis not present

## 2018-10-09 DIAGNOSIS — D631 Anemia in chronic kidney disease: Secondary | ICD-10-CM | POA: Diagnosis not present

## 2018-10-09 DIAGNOSIS — Z79899 Other long term (current) drug therapy: Secondary | ICD-10-CM | POA: Diagnosis not present

## 2018-10-09 DIAGNOSIS — I130 Hypertensive heart and chronic kidney disease with heart failure and stage 1 through stage 4 chronic kidney disease, or unspecified chronic kidney disease: Secondary | ICD-10-CM | POA: Diagnosis not present

## 2018-10-09 DIAGNOSIS — J9601 Acute respiratory failure with hypoxia: Secondary | ICD-10-CM | POA: Diagnosis not present

## 2018-10-09 DIAGNOSIS — H8110 Benign paroxysmal vertigo, unspecified ear: Secondary | ICD-10-CM | POA: Diagnosis not present

## 2018-10-09 DIAGNOSIS — I5042 Chronic combined systolic (congestive) and diastolic (congestive) heart failure: Secondary | ICD-10-CM | POA: Diagnosis not present

## 2018-10-09 DIAGNOSIS — N184 Chronic kidney disease, stage 4 (severe): Secondary | ICD-10-CM | POA: Diagnosis not present

## 2018-10-09 DIAGNOSIS — J8410 Pulmonary fibrosis, unspecified: Secondary | ICD-10-CM | POA: Diagnosis not present

## 2018-10-09 DIAGNOSIS — E440 Moderate protein-calorie malnutrition: Secondary | ICD-10-CM | POA: Diagnosis not present

## 2018-10-09 DIAGNOSIS — Z9181 History of falling: Secondary | ICD-10-CM | POA: Diagnosis not present

## 2018-10-11 ENCOUNTER — Ambulatory Visit (HOSPITAL_COMMUNITY)
Admission: RE | Admit: 2018-10-11 | Discharge: 2018-10-11 | Disposition: A | Discharge status: Home / Self Care | Payer: Medicare Other | Source: Ambulatory Visit | Attending: Internal Medicine | Admitting: Internal Medicine

## 2018-10-11 ENCOUNTER — Other Ambulatory Visit: Payer: Self-pay

## 2018-10-11 ENCOUNTER — Other Ambulatory Visit (HOSPITAL_COMMUNITY)
Admission: RE | Admit: 2018-10-11 | Discharge: 2018-10-11 | Disposition: A | Discharge status: Home / Self Care | Payer: Medicare Other | Source: Ambulatory Visit | Attending: Internal Medicine | Admitting: Internal Medicine

## 2018-10-11 ENCOUNTER — Encounter (HOSPITAL_COMMUNITY): Payer: Self-pay

## 2018-10-11 VITALS — BP 116/80 | HR 115 | Wt 169.0 lb

## 2018-10-11 DIAGNOSIS — K5090 Crohn's disease, unspecified, without complications: Secondary | ICD-10-CM | POA: Diagnosis not present

## 2018-10-11 DIAGNOSIS — D539 Nutritional anemia, unspecified: Secondary | ICD-10-CM | POA: Insufficient documentation

## 2018-10-11 DIAGNOSIS — I5042 Chronic combined systolic (congestive) and diastolic (congestive) heart failure: Secondary | ICD-10-CM | POA: Diagnosis not present

## 2018-10-11 DIAGNOSIS — N184 Chronic kidney disease, stage 4 (severe): Secondary | ICD-10-CM | POA: Insufficient documentation

## 2018-10-11 DIAGNOSIS — I130 Hypertensive heart and chronic kidney disease with heart failure and stage 1 through stage 4 chronic kidney disease, or unspecified chronic kidney disease: Secondary | ICD-10-CM | POA: Insufficient documentation

## 2018-10-11 DIAGNOSIS — M109 Gout, unspecified: Secondary | ICD-10-CM | POA: Insufficient documentation

## 2018-10-11 DIAGNOSIS — I5022 Chronic systolic (congestive) heart failure: Secondary | ICD-10-CM | POA: Insufficient documentation

## 2018-10-11 DIAGNOSIS — Z87891 Personal history of nicotine dependence: Secondary | ICD-10-CM | POA: Insufficient documentation

## 2018-10-11 DIAGNOSIS — Z8059 Family history of malignant neoplasm of other urinary tract organ: Secondary | ICD-10-CM | POA: Diagnosis not present

## 2018-10-11 DIAGNOSIS — N183 Chronic kidney disease, stage 3 unspecified: Secondary | ICD-10-CM

## 2018-10-11 DIAGNOSIS — R000 Tachycardia, unspecified: Secondary | ICD-10-CM | POA: Diagnosis not present

## 2018-10-11 DIAGNOSIS — R9431 Abnormal electrocardiogram [ECG] [EKG]: Secondary | ICD-10-CM | POA: Insufficient documentation

## 2018-10-11 DIAGNOSIS — F319 Bipolar disorder, unspecified: Secondary | ICD-10-CM | POA: Insufficient documentation

## 2018-10-11 DIAGNOSIS — Z836 Family history of other diseases of the respiratory system: Secondary | ICD-10-CM | POA: Diagnosis not present

## 2018-10-11 DIAGNOSIS — Z79899 Other long term (current) drug therapy: Secondary | ICD-10-CM | POA: Insufficient documentation

## 2018-10-11 DIAGNOSIS — Z20828 Contact with and (suspected) exposure to other viral communicable diseases: Secondary | ICD-10-CM | POA: Insufficient documentation

## 2018-10-11 DIAGNOSIS — I429 Cardiomyopathy, unspecified: Secondary | ICD-10-CM | POA: Diagnosis not present

## 2018-10-11 DIAGNOSIS — Z833 Family history of diabetes mellitus: Secondary | ICD-10-CM | POA: Diagnosis not present

## 2018-10-11 DIAGNOSIS — J439 Emphysema, unspecified: Secondary | ICD-10-CM | POA: Diagnosis not present

## 2018-10-11 LAB — BASIC METABOLIC PANEL
Anion gap: 8 (ref 5–15)
BUN: 39 mg/dL — ABNORMAL HIGH (ref 8–23)
CO2: 19 mmol/L — ABNORMAL LOW (ref 22–32)
Calcium: 9 mg/dL (ref 8.9–10.3)
Chloride: 114 mmol/L — ABNORMAL HIGH (ref 98–111)
Creatinine, Ser: 2.65 mg/dL — ABNORMAL HIGH (ref 0.61–1.24)
GFR calc Af Amer: 27 mL/min — ABNORMAL LOW (ref >60)
GFR calc non Af Amer: 23 mL/min — ABNORMAL LOW (ref >60)
Glucose, Bld: 116 mg/dL — ABNORMAL HIGH (ref 70–99)
Potassium: 4.8 mmol/L (ref 3.5–5.1)
Sodium: 141 mmol/L (ref 135–145)

## 2018-10-11 LAB — SARS CORONAVIRUS 2 (TAT 6-24 HRS): SARS Coronavirus 2: NEGATIVE

## 2018-10-11 MED ORDER — SODIUM CHLORIDE 0.9% FLUSH: 3.0000 mL | Freq: Two times a day (BID) | INTRAVENOUS | Status: DC

## 2018-10-11 NOTE — Progress Notes (Signed)
Called pt and reviewed recommendations for upcoming rhc. Pt verbalized understanding and has no further questions at this time.

## 2018-10-11 NOTE — Progress Notes (Signed)
ADVANCED HF CLINIC NOTE  PCP: Gayland Curry, DO Cardiology: Dr. Leandro Reasoner Brent [name] is a 73 y.o. with a history of Crohn's disease, celiac disease,HTN, COPD,priortobacco use, CKD 3, chronic anemia, and bipolar disorder.Diagnosed with systolic HF in 6/56 (echo with EF 30-35%).   Admitted 3/11-3/16/20 with SOB and hypoxia. Found to have PNA and required BIPAP and antibiotics. Blood cultures were negative. Echo showed newly reduced EF 30-35%. Cardiology consulted and he had nuclear stress test, which was read as "high risk" due to low EF. Per Dr Sallyanne Kuster, did not think changes were indicative of coronary disease and cath was not pursued due this and due to CKD. HF meds optimized. Limited with CKD3. He had one short run of NSVT. Also had AKI on CKD3, but creatinine improved to baseline 1.77 on day of discharge.  He was seen in ED 05/12/18 with SOB after misplacing his nebulizer. BNP was elevated, so he was given 40 mg IV lasix and discharged with lasix 20 mg BID. Referred to HF clinic.  He was admitted again in 7/20 with weakness, orthostatic hypotension, and AKI.  Cardiac meds were held.  He was noted to be severely deconditioned, and he was discharged to SNF.  He remained in SNF x 2 wks and is now home.   Seen by Dr. [name] on 09/29/18. Echo 20-25% with mild LVH and normal RV. At that time was tachycardic with NYHA III-IIIB symptoms Discussed possible RHC with him  He was scheduled for PharmD appointment today and I was asked to see him. He says he continues to feel worse. SOB with any activity. Struggling to do ADLs. Denies CP, edema, orthopnea or PND.   Labs (7/20): K 5.1, creatinine 2.25  ECG (7/20, personally reviewed): NSR, LVH, anterolateral nonspecific T wave inversions.   PMH: 1. Crohns disease: Gets Remicade.  2. Celiac disease.  3. COPD: Prior smoker.  - CT chest (7/20) with mild emphysema.  4. CKD: Stage 3.  5. HTN 6. Bipolar disorder 7. H/o BPPV 8. Gout 9. Chronic  systolic CHF: Etiology uncertain.  - Echo (3/20): EF 30-35%, mild LVH, normal RV.  - Cardiolite (3/20): EF 28%, no ischemia, fixed defects in anteroseptal and inferolateral walls.  - Echo (8/20): EF 20-25%, diffuse hypokinesis, mild LVH, normal RV size and systolic function, normal IVC size.   Social History   Socioeconomic History  . Marital status: Divorced    Spouse name: Not on file  . Number of children: 1  . Years of education: Not on file  . Highest education level: Not on file  Occupational History  . Occupation: retired  . Occupation: [employer]  . Financial resource strain: Not hard at all  . Food insecurity    Worry: Never true    Inability: Never true  . Transportation needs    Medical: No    Non-medical: No  Tobacco Use  . Smoking status: Former Smoker    Packs/day: 1.00    Years: 49.00    Pack years: 49.00    Types: Cigarettes    Start date: 04/11/1956    Quit date: 04/17/2014    Years since quitting: 4.4  . Smokeless tobacco: Never Used  Substance and Sexual Activity  . Alcohol use: No    Alcohol/week: 0.0 standard drinks  . Drug use: No  . Sexual activity: Never  Lifestyle  . Physical activity    Days per week: 0 days    Minutes per session: 0  min  . Stress: Not at all  Relationships  . Social connections    Talks on phone: More than three times a week    Gets together: Three times a week    Attends religious service: More than 4 times per year    Active member of club or organization: Yes    Attends meetings of clubs or organizations: 1 to 4 times per year    Relationship status: Divorced  . Intimate partner violence    Fear of current or ex partner: No    Emotionally abused: No    Physically abused: No    Forced sexual activity: No  Other Topics Concern  . Not on file  Social History Narrative  . Not on file   Family History  Problem Relation Age of Onset  . Diabetes Mother        maternal grandmother  . Uterine cancer  Mother   . Emphysema Father   . Pneumonia Maternal Grandmother   . Colon cancer Neg Hx    ROS: All systems reviewed and negative except as per HPI.   Current Outpatient Medications  Medication Sig Dispense Refill  . allopurinol (ZYLOPRIM) 100 MG tablet Take 2 tablets (200 mg total) by mouth daily. 60 tablet 5  . cholecalciferol (VITAMIN D) 1000 units tablet Take 1,000 Units by mouth daily.     . divalproex (DEPAKOTE) 500 MG EC tablet Take 1,500 mg by mouth at bedtime.     Marland Kitchen epoetin alfa (PROCRIT) 81275 UNIT/ML injection Inject 10,000 Units into the skin every 30 (thirty) days.     . ferrous sulfate 325 (65 FE) MG tablet Take 325 mg by mouth every Monday, Wednesday, and Friday.     . furosemide (LASIX) 20 MG tablet Take 1 tablet (20 mg total) by mouth every other day. 36 tablet 3  . hydrALAZINE (APRESOLINE) 10 MG tablet Take 1 tablet (10 mg total) by mouth 2 (two) times a day. 60 tablet 0  . inFLIXimab (REMICADE) 100 MG injection Infuse Remicade IV schedule 1 3m/kg every 8 weeks Premedicate with Tylenol 500-6569mby mouth and Benadryl 25-5034my mouth prior to infusion. Last PPD was on 12/2009.  1 each 6  . isosorbide mononitrate (IMDUR) 30 MG 24 hr tablet Take 1 tablet (30 mg total) by mouth daily. 30 tablet 0  . loperamide (IMODIUM) 2 MG capsule Take 4 mg by mouth daily as needed for diarrhea or loose stools.    . magnesium gluconate (MAGONATE) 30 MG tablet Take 1 tablet (30 mg total) by mouth every Monday, Wednesday, and Friday. 10 tablet 0  . metoprolol succinate (TOPROL XL) 25 MG 24 hr tablet Take 1 tablet (25 mg total) by mouth at bedtime. 30 tablet 11  . OLANZapine (ZYPREXA) 7.5 MG tablet Take 1 tablet (7.5 mg total) by mouth at bedtime. 30 tablet 11  . omeprazole (PRILOSEC) 20 MG capsule Take 20 mg by mouth daily before supper.      . polyethylene glycol (MIRALAX / GLYCOLAX) packet Take 17 g by mouth daily as needed for mild constipation. 14 each 0  . tamsulosin (FLOMAX) 0.4 MG  CAPS capsule Take 0.4 mg by mouth daily.     Current Facility-Administered Medications  Medication Dose Route Frequency Provider Last Rate Last Dose  . sodium chloride flush (NS) 0.9 % injection 3 mL  3 mL Intravenous Q12H Caddie Randle, DanShaune PascalD       Facility-Administered Medications Ordered in Other Encounters  Medication Dose Route  Frequency Provider Last Rate Last Dose  . epoetin alfa (EPOGEN) injection 10,000 Units  10,000 Units Subcutaneous Q30 days Elmarie Shiley, MD       Wt 76.7 kg (169 lb)   BMI 20.57 kg/m  General: thin frail dyspneic with ay exertion and while talking.  HEENT: normal Neck: supple. no JVD. Carotids 2+ bilat; no bruits. No lymphadenopathy or thryomegaly appreciated. Cor: PMI nondisplaced. Tachy regular +s3 Lungs: clear Abdomen: soft, nontender, nondistended. No hepatosplenomegaly. No bruits or masses. Good bowel sounds. Extremities: no cyanosis, clubbing, rash, edema Neuro: alert & orientedx3, cranial nerves grossly intact. moves all 4 extremities w/o difficulty. Affect pleasant  ECG: Sinus tach 115 LVH narrow QRS Personally reviewed   Assessment/Plan:  1. Chronic systolic CHF: Diagnosed by echo in 3/20, EF 30-35%.  Cardiolite showed fixed defects but no ischemia.  Etiology of cardiomyopathy is uncertain, he has not had cath or MRI due to elevated creatinine. He has no chest pain.  Medication titration has been limited by orthostasis/low BP.  Echo was done earlier this month and reviewed, EF 20-25% with mild LVH and normal RV function.  He has progressive NYHA class III-IIIB symptoms.  He is not volume overloaded on exam.  Resting sinus tachycardia is concerning for low output.  - Will plan RHC tomorrow with possible admission for inotropic support - Continue current hydralazine/Imdur.  - Continue Toprol XL to 25 mg daily, take in evening before bed.  - Add ivabradine 2.5 bid - If requires inotropic support can consder coronary angio if creatinine improves to <  2.0 - BMET today, if creatinine is coming down, will try a low dose of losartan next.  If not, probably would add Corlanor.   - He would be a suboptimal  candidate for advanced therapies with deconditioning, renal dysfunction, and lives alone.   2. CKD: Stage 3-4. -  BMET today.  - Low output may be playing a role   3. COPD: Mild emphysema on prior chest CT.   - Given significant dyspnea, will need to eventually set him up for PFTs.   4. Sinus tach - suspect related to low output - I reviewed ECG with Dr. Caryl Comes as I was concerned there may be an underlying atrial tach but he felt it was sinus tach.  Total time spent 45 minutes. Over half that time spent discussing above.   Glori Bickers MD 10/11/2018

## 2018-10-11 NOTE — Progress Notes (Addendum)
PCP: Gayland Curry, DO Cardiology: Dr. [name]  HPI:  Brent [name] a 73 y.o.with a history of Crohn's disease, celiac disease,HTN, COPD,priortobacco use, CKD 3, chronic anemia, and bipolar disorder.Diagnosed with systolic HF in 8/10 (echo with EF 30-35%).   Admitted 3/11-3/16/20 with SOB and hypoxia. Found to have PNA and required BIPAP and antibiotics. Blood cultures were negative. Echo showed newly reduced EF 30-35%. Cardiology consulted and he had nuclear stress test, which was read as "high risk" due to low EF. Per Dr Sallyanne Kuster, did not think changes were indicative of coronary disease and cath was not pursued due this and due to CKD. HF meds optimized. Limited with CKD3. He had one short run of NSVT. Also had AKI on CKD3, but creatinine improved to baseline 1.77 on day of discharge.  He was seen in ED 05/12/18 with SOB after misplacing his nebulizer. BNP was elevated, so he was given 40 mg IV lasix and discharged with lasix 20 mg BID.   He was admitted again in 7/20 with weakness, orthostatic hypotension, and AKI.  Cardiac meds were held.  He was noted to be severely deconditioned, and he was discharged to SNF.  He remained in SNF x 2 wks and is now home.   Echo was done on 09/29/18 and EF remained 20-25% with mild LVH and normal RV.   He recently returned for follow up in HF clinic on 09/29/18. He reported lightheadedness with standing but denied falls.  He was short of breath walking up 1 flight of stairs or walking about 100 feet. No chest pain.  No orthopnea/PND.  He uses a nebulizer for COPD and reported that this helped his dyspnea.   Today he returns to HF clinic for pharmacist medication titration. At last visit with MD, his metoprolol succinate was increased from 12.5 mg to 25 mg daily. He is SOB while sitting and reports only being able to walk 25 feet before stopping to catch his breath. He is dizzy "most of the time" but does not report any falls. He follows a low sodium  diet and fluid restriction. No lower extremity edema or PND/orthopnea. He takes furosemide 20 mg every other day. He does not weigh himself daily. HR on pulse ox read 220. Sinus tachycardia with EKG discussed with Dr. Haroldine Laws.     . Shortness of breath/dyspnea on exertion? yes (SOB at rest) . Orthopnea/PND? no . Edema? no . Lightheadedness/dizziness? yes . Daily weights at home? no . Blood pressure/heart rate monitoring at home? no . Following low-sodium/fluid-restricted diet? yes  HF Medications: Furosemide 20 mg every other day Hydralazine 10 mg twice daily Imdur 30 mg daily Metoprolol succinate 25 mg daily  Has the patient been experiencing any side effects to the medications prescribed?  no  Does the patient have any problems obtaining medications due to transportation or finances?   no  Understanding of regimen: fair Understanding of indications: good Potential of compliance: good Patient understands to avoid NSAIDs. Patient understands to avoid decongestants.    Pertinent Lab Values: . Serum creatinine 2.65, BUN 39, Potassium 4.8, Sodium 141  Vital Signs: . Weight: 169 lbs (last clinic weight: 170 lbs) . Blood pressure: 116/80  . Heart rate: 115   Assessment: 1. Chronic systolic CHF: Diagnosed by echo in 3/20, EF 30-35%.  Cardiolite showed fixed defects but no ischemia.  Etiology of cardiomyopathy is uncertain, he has not had cath or MRI due to elevated creatinine. He has no chest pain.  Medication titration has  been limited by orthostasis/low BP/CrCL.  Echo 09/29/18 showed EF 20-25% with mild LVH and normal RV function.  He has NYHA class IV symptoms.  He is not volume overloaded on exam.  Resting sinus tachycardia is concerning for low output.  - Continue current hydralazine/Imdur.  - ContinueToprol XL 25 mg nightly - Continue Lasix 20 mg every other day.  - Unable to start ACEI/ARB/ARNI due to renal dysfunction. - Will obtain RHC tomorrow to assess fluid status and  cardiac function.  -Plan to start ivabradine 2.5 mg daily for sinus tachycardia (unable to obtain samples during clinic today  - will follow up post cath tomorrow).   2. CKD: Stage 3.  BMET today.    Plan: 1) Changes: Based on clinical presentation, vital signs and recent labs will schedule RHC for tomorrow. Plan to start ivabradine 2.5 mg twice daily.  3) Follow-up: RHC 10/12/18 and HF Clinic with Dr. [name] 11/10/18  Audry Riles, PharmD, BCPS Heart Failure Clinic Pharmacist [phone]  Bonnita Nasuti PharmD BCPS, [facility], CPP Clinical Pharmacist  - [place] 10/11/2018

## 2018-10-11 NOTE — Patient Instructions (Addendum)
Return tomorrow for right heart catheterization Continue taking medications as prescribed

## 2018-10-11 NOTE — H&P (View-Only) (Signed)
ADVANCED HF CLINIC NOTE  PCP: Gayland Curry, DO Cardiology: Dr. Leandro Reasoner Brent [name] is a 73 y.o. with a history of Crohn's disease, celiac disease,HTN, COPD,priortobacco use, CKD 3, chronic anemia, and bipolar disorder.Diagnosed with systolic HF in 2/45 (echo with EF 30-35%).   Admitted 3/11-3/16/20 with SOB and hypoxia. Found to have PNA and required BIPAP and antibiotics. Blood cultures were negative. Echo showed newly reduced EF 30-35%. Cardiology consulted and he had nuclear stress test, which was read as "high risk" due to low EF. Per Dr Sallyanne Kuster, did not think changes were indicative of coronary disease and cath was not pursued due this and due to CKD. HF meds optimized. Limited with CKD3. He had one short run of NSVT. Also had AKI on CKD3, but creatinine improved to baseline 1.77 on day of discharge.  He was seen in ED 05/12/18 with SOB after misplacing his nebulizer. BNP was elevated, so he was given 40 mg IV lasix and discharged with lasix 20 mg BID. Referred to HF clinic.  He was admitted again in 7/20 with weakness, orthostatic hypotension, and AKI.  Cardiac meds were held.  He was noted to be severely deconditioned, and he was discharged to SNF.  He remained in SNF x 2 wks and is now home.   Seen by Dr. [name] on 09/29/18. Echo 20-25% with mild LVH and normal RV. At that time was tachycardic with NYHA III-IIIB symptoms Discussed possible RHC with him  He was scheduled for PharmD appointment today and I was asked to see him. He says he continues to feel worse. SOB with any activity. Struggling to do ADLs. Denies CP, edema, orthopnea or PND.   Labs (7/20): K 5.1, creatinine 2.25  ECG (7/20, personally reviewed): NSR, LVH, anterolateral nonspecific T wave inversions.   PMH: 1. Crohns disease: Gets Remicade.  2. Celiac disease.  3. COPD: Prior smoker.  - CT chest (7/20) with mild emphysema.  4. CKD: Stage 3.  5. HTN 6. Bipolar disorder 7. H/o BPPV 8. Gout 9. Chronic  systolic CHF: Etiology uncertain.  - Echo (3/20): EF 30-35%, mild LVH, normal RV.  - Cardiolite (3/20): EF 28%, no ischemia, fixed defects in anteroseptal and inferolateral walls.  - Echo (8/20): EF 20-25%, diffuse hypokinesis, mild LVH, normal RV size and systolic function, normal IVC size.   Social History   Socioeconomic History  . Marital status: Divorced    Spouse name: Not on file  . Number of children: 1  . Years of education: Not on file  . Highest education level: Not on file  Occupational History  . Occupation: retired  . Occupation: [employer]  . Financial resource strain: Not hard at all  . Food insecurity    Worry: Never true    Inability: Never true  . Transportation needs    Medical: No    Non-medical: No  Tobacco Use  . Smoking status: Former Smoker    Packs/day: 1.00    Years: 49.00    Pack years: 49.00    Types: Cigarettes    Start date: 04/11/1956    Quit date: 04/17/2014    Years since quitting: 4.4  . Smokeless tobacco: Never Used  Substance and Sexual Activity  . Alcohol use: No    Alcohol/week: 0.0 standard drinks  . Drug use: No  . Sexual activity: Never  Lifestyle  . Physical activity    Days per week: 0 days    Minutes per session: 0  min  . Stress: Not at all  Relationships  . Social connections    Talks on phone: More than three times a week    Gets together: Three times a week    Attends religious service: More than 4 times per year    Active member of club or organization: Yes    Attends meetings of clubs or organizations: 1 to 4 times per year    Relationship status: Divorced  . Intimate partner violence    Fear of current or ex partner: No    Emotionally abused: No    Physically abused: No    Forced sexual activity: No  Other Topics Concern  . Not on file  Social History Narrative  . Not on file   Family History  Problem Relation Age of Onset  . Diabetes Mother        maternal grandmother  . Uterine cancer  Mother   . Emphysema Father   . Pneumonia Maternal Grandmother   . Colon cancer Neg Hx    ROS: All systems reviewed and negative except as per HPI.   Current Outpatient Medications  Medication Sig Dispense Refill  . allopurinol (ZYLOPRIM) 100 MG tablet Take 2 tablets (200 mg total) by mouth daily. 60 tablet 5  . cholecalciferol (VITAMIN D) 1000 units tablet Take 1,000 Units by mouth daily.     . divalproex (DEPAKOTE) 500 MG EC tablet Take 1,500 mg by mouth at bedtime.     Marland Kitchen epoetin alfa (PROCRIT) 42595 UNIT/ML injection Inject 10,000 Units into the skin every 30 (thirty) days.     . ferrous sulfate 325 (65 FE) MG tablet Take 325 mg by mouth every Monday, Wednesday, and Friday.     . furosemide (LASIX) 20 MG tablet Take 1 tablet (20 mg total) by mouth every other day. 36 tablet 3  . hydrALAZINE (APRESOLINE) 10 MG tablet Take 1 tablet (10 mg total) by mouth 2 (two) times a day. 60 tablet 0  . inFLIXimab (REMICADE) 100 MG injection Infuse Remicade IV schedule 1 21m/kg every 8 weeks Premedicate with Tylenol 500-6565mby mouth and Benadryl 25-5051my mouth prior to infusion. Last PPD was on 12/2009.  1 each 6  . isosorbide mononitrate (IMDUR) 30 MG 24 hr tablet Take 1 tablet (30 mg total) by mouth daily. 30 tablet 0  . loperamide (IMODIUM) 2 MG capsule Take 4 mg by mouth daily as needed for diarrhea or loose stools.    . magnesium gluconate (MAGONATE) 30 MG tablet Take 1 tablet (30 mg total) by mouth every Monday, Wednesday, and Friday. 10 tablet 0  . metoprolol succinate (TOPROL XL) 25 MG 24 hr tablet Take 1 tablet (25 mg total) by mouth at bedtime. 30 tablet 11  . OLANZapine (ZYPREXA) 7.5 MG tablet Take 1 tablet (7.5 mg total) by mouth at bedtime. 30 tablet 11  . omeprazole (PRILOSEC) 20 MG capsule Take 20 mg by mouth daily before supper.      . polyethylene glycol (MIRALAX / GLYCOLAX) packet Take 17 g by mouth daily as needed for mild constipation. 14 each 0  . tamsulosin (FLOMAX) 0.4 MG  CAPS capsule Take 0.4 mg by mouth daily.     Current Facility-Administered Medications  Medication Dose Route Frequency Provider Last Rate Last Dose  . sodium chloride flush (NS) 0.9 % injection 3 mL  3 mL Intravenous Q12H Amal Saiki, DanShaune PascalD       Facility-Administered Medications Ordered in Other Encounters  Medication Dose Route  Frequency Provider Last Rate Last Dose  . epoetin alfa (EPOGEN) injection 10,000 Units  10,000 Units Subcutaneous Q30 days Elmarie Shiley, MD       Wt 76.7 kg (169 lb)   BMI 20.57 kg/m  General: thin frail dyspneic with ay exertion and while talking.  HEENT: normal Neck: supple. no JVD. Carotids 2+ bilat; no bruits. No lymphadenopathy or thryomegaly appreciated. Cor: PMI nondisplaced. Tachy regular +s3 Lungs: clear Abdomen: soft, nontender, nondistended. No hepatosplenomegaly. No bruits or masses. Good bowel sounds. Extremities: no cyanosis, clubbing, rash, edema Neuro: alert & orientedx3, cranial nerves grossly intact. moves all 4 extremities w/o difficulty. Affect pleasant  ECG: Sinus tach 115 LVH narrow QRS Personally reviewed   Assessment/Plan:  1. Chronic systolic CHF: Diagnosed by echo in 3/20, EF 30-35%.  Cardiolite showed fixed defects but no ischemia.  Etiology of cardiomyopathy is uncertain, he has not had cath or MRI due to elevated creatinine. He has no chest pain.  Medication titration has been limited by orthostasis/low BP.  Echo was done earlier this month and reviewed, EF 20-25% with mild LVH and normal RV function.  He has progressive NYHA class III-IIIB symptoms.  He is not volume overloaded on exam.  Resting sinus tachycardia is concerning for low output.  - Will plan RHC tomorrow with possible admission for inotropic support - Continue current hydralazine/Imdur.  - Continue Toprol XL to 25 mg daily, take in evening before bed.  - Add ivabradine 2.5 bid - If requires inotropic support can consder coronary angio if creatinine improves to <  2.0 - BMET today, if creatinine is coming down, will try a low dose of losartan next.  If not, probably would add Corlanor.   - He would be a suboptimal  candidate for advanced therapies with deconditioning, renal dysfunction, and lives alone.   2. CKD: Stage 3-4. -  BMET today.  - Low output may be playing a role   3. COPD: Mild emphysema on prior chest CT.   - Given significant dyspnea, will need to eventually set him up for PFTs.   4. Sinus tach - suspect related to low output - I reviewed ECG with Dr. Caryl Comes as I was concerned there may be an underlying atrial tach but he felt it was sinus tach.  Total time spent 45 minutes. Over half that time spent discussing above.   Glori Bickers MD 10/11/2018

## 2018-10-12 ENCOUNTER — Ambulatory Visit (HOSPITAL_COMMUNITY)
Admission: RE | Admit: 2018-10-12 | Discharge: 2018-10-12 | Disposition: A | Discharge status: Home / Self Care | Payer: Medicare Other | Attending: Internal Medicine | Admitting: Internal Medicine

## 2018-10-12 ENCOUNTER — Encounter (HOSPITAL_COMMUNITY)
Admission: RE | Disposition: A | Discharge status: Home / Self Care | Payer: Self-pay | Source: Home / Self Care | Attending: Internal Medicine

## 2018-10-12 DIAGNOSIS — K5090 Crohn's disease, unspecified, without complications: Secondary | ICD-10-CM | POA: Diagnosis not present

## 2018-10-12 DIAGNOSIS — Z87891 Personal history of nicotine dependence: Secondary | ICD-10-CM | POA: Insufficient documentation

## 2018-10-12 DIAGNOSIS — J9601 Acute respiratory failure with hypoxia: Secondary | ICD-10-CM | POA: Diagnosis not present

## 2018-10-12 DIAGNOSIS — K900 Celiac disease: Secondary | ICD-10-CM | POA: Insufficient documentation

## 2018-10-12 DIAGNOSIS — N184 Chronic kidney disease, stage 4 (severe): Secondary | ICD-10-CM | POA: Insufficient documentation

## 2018-10-12 DIAGNOSIS — I5022 Chronic systolic (congestive) heart failure: Secondary | ICD-10-CM | POA: Diagnosis not present

## 2018-10-12 DIAGNOSIS — M109 Gout, unspecified: Secondary | ICD-10-CM | POA: Diagnosis not present

## 2018-10-12 DIAGNOSIS — F319 Bipolar disorder, unspecified: Secondary | ICD-10-CM | POA: Insufficient documentation

## 2018-10-12 DIAGNOSIS — J8410 Pulmonary fibrosis, unspecified: Secondary | ICD-10-CM | POA: Diagnosis not present

## 2018-10-12 DIAGNOSIS — I130 Hypertensive heart and chronic kidney disease with heart failure and stage 1 through stage 4 chronic kidney disease, or unspecified chronic kidney disease: Secondary | ICD-10-CM | POA: Diagnosis not present

## 2018-10-12 DIAGNOSIS — D631 Anemia in chronic kidney disease: Secondary | ICD-10-CM | POA: Insufficient documentation

## 2018-10-12 DIAGNOSIS — J449 Chronic obstructive pulmonary disease, unspecified: Secondary | ICD-10-CM | POA: Insufficient documentation

## 2018-10-12 DIAGNOSIS — I5042 Chronic combined systolic (congestive) and diastolic (congestive) heart failure: Secondary | ICD-10-CM | POA: Diagnosis not present

## 2018-10-12 DIAGNOSIS — Z79899 Other long term (current) drug therapy: Secondary | ICD-10-CM | POA: Insufficient documentation

## 2018-10-12 DIAGNOSIS — E440 Moderate protein-calorie malnutrition: Secondary | ICD-10-CM | POA: Diagnosis not present

## 2018-10-12 DIAGNOSIS — R000 Tachycardia, unspecified: Secondary | ICD-10-CM | POA: Diagnosis not present

## 2018-10-12 DIAGNOSIS — K50919 Crohn's disease, unspecified, with unspecified complications: Secondary | ICD-10-CM | POA: Diagnosis not present

## 2018-10-12 DIAGNOSIS — Z9181 History of falling: Secondary | ICD-10-CM | POA: Diagnosis not present

## 2018-10-12 DIAGNOSIS — R270 Ataxia, unspecified: Secondary | ICD-10-CM | POA: Diagnosis not present

## 2018-10-12 DIAGNOSIS — H8110 Benign paroxysmal vertigo, unspecified ear: Secondary | ICD-10-CM | POA: Diagnosis not present

## 2018-10-12 HISTORY — PX: RIGHT HEART CATH: CATH118263

## 2018-10-12 LAB — CBC
HCT: 31.3 % — ABNORMAL LOW (ref 39.0–52.0)
Hemoglobin: 9.5 g/dL — ABNORMAL LOW (ref 13.0–17.0)
MCH: 34.7 pg — ABNORMAL HIGH (ref 26.0–34.0)
MCHC: 30.4 g/dL (ref 30.0–36.0)
MCV: 114.2 fL — ABNORMAL HIGH (ref 80.0–100.0)
Platelets: 202 10*3/uL (ref 150–400)
RBC: 2.74 MIL/uL — ABNORMAL LOW (ref 4.22–5.81)
RDW: 14.2 % (ref 11.5–15.5)
WBC: 10.6 10*3/uL — ABNORMAL HIGH (ref 4.0–10.5)
nRBC: 0 % (ref 0.0–0.2)

## 2018-10-12 LAB — POCT I-STAT EG7
Acid-base deficit: 7 mmol/L — ABNORMAL HIGH (ref 0.0–2.0)
Acid-base deficit: 7 mmol/L — ABNORMAL HIGH (ref 0.0–2.0)
Acid-base deficit: 9 mmol/L — ABNORMAL HIGH (ref 0.0–2.0)
Bicarbonate: 15.7 mmol/L — ABNORMAL LOW (ref 20.0–28.0)
Bicarbonate: 18.1 mmol/L — ABNORMAL LOW (ref 20.0–28.0)
Bicarbonate: 18.3 mmol/L — ABNORMAL LOW (ref 20.0–28.0)
Calcium, Ion: 0.99 mmol/L — ABNORMAL LOW (ref 1.15–1.40)
Calcium, Ion: 1.2 mmol/L (ref 1.15–1.40)
Calcium, Ion: 1.32 mmol/L (ref 1.15–1.40)
HCT: 25 % — ABNORMAL LOW (ref 39.0–52.0)
HCT: 25 % — ABNORMAL LOW (ref 39.0–52.0)
HCT: 29 % — ABNORMAL LOW (ref 39.0–52.0)
Hemoglobin: 8.5 g/dL — ABNORMAL LOW (ref 13.0–17.0)
Hemoglobin: 8.5 g/dL — ABNORMAL LOW (ref 13.0–17.0)
Hemoglobin: 9.9 g/dL — ABNORMAL LOW (ref 13.0–17.0)
O2 Saturation: 64 %
O2 Saturation: 69 %
O2 Saturation: 69 %
Potassium: 3.6 mmol/L (ref 3.5–5.1)
Potassium: 4.1 mmol/L (ref 3.5–5.1)
Potassium: 4.3 mmol/L (ref 3.5–5.1)
Sodium: 144 mmol/L (ref 135–145)
Sodium: 145 mmol/L (ref 135–145)
Sodium: 150 mmol/L — ABNORMAL HIGH (ref 135–145)
TCO2: 17 mmol/L — ABNORMAL LOW (ref 22–32)
TCO2: 19 mmol/L — ABNORMAL LOW (ref 22–32)
TCO2: 19 mmol/L — ABNORMAL LOW (ref 22–32)
pCO2, Ven: 29.6 mmHg — ABNORMAL LOW (ref 44.0–60.0)
pCO2, Ven: 33.8 mmHg — ABNORMAL LOW (ref 44.0–60.0)
pCO2, Ven: 34.9 mmHg — ABNORMAL LOW (ref 44.0–60.0)
pH, Ven: 7.327 (ref 7.250–7.430)
pH, Ven: 7.332 (ref 7.250–7.430)
pH, Ven: 7.337 (ref 7.250–7.430)
pO2, Ven: 35 mmHg (ref 32.0–45.0)
pO2, Ven: 38 mmHg (ref 32.0–45.0)
pO2, Ven: 38 mmHg (ref 32.0–45.0)

## 2018-10-12 SURGERY — RIGHT HEART CATH
Anesthesia: LOCAL

## 2018-10-12 MED ORDER — LABETALOL HCL 5 MG/ML IV SOLN: 10.0000 mg | INTRAVENOUS | Status: DC | PRN

## 2018-10-12 MED ORDER — SODIUM CHLORIDE 0.9 % IV SOLN: 250.0000 mL | INTRAVENOUS | Status: DC | PRN

## 2018-10-12 MED ORDER — HEPARIN (PORCINE) IN NACL 1000-0.9 UT/500ML-% IV SOLN
INTRAVENOUS | Status: DC | PRN
  Administered 2018-10-12: 500 mL

## 2018-10-12 MED ORDER — ASPIRIN 81 MG PO CHEW
81.0000 mg | CHEWABLE_TABLET | ORAL | Status: AC
  Administered 2018-10-12: 81 mg via ORAL
  Filled 2018-10-12: qty 1

## 2018-10-12 MED ORDER — SODIUM CHLORIDE 0.9% FLUSH: 3.0000 mL | INTRAVENOUS | Status: DC | PRN

## 2018-10-12 MED ORDER — HYDRALAZINE HCL 20 MG/ML IJ SOLN: 10.0000 mg | INTRAMUSCULAR | Status: DC | PRN

## 2018-10-12 MED ORDER — LIDOCAINE HCL (PF) 1 % IJ SOLN
INTRAMUSCULAR | Status: AC
  Filled 2018-10-12: qty 30

## 2018-10-12 MED ORDER — ACETAMINOPHEN 325 MG PO TABS: 650.0000 mg | ORAL_TABLET | ORAL | Status: DC | PRN

## 2018-10-12 MED ORDER — ONDANSETRON HCL 4 MG/2ML IJ SOLN: 4.0000 mg | Freq: Four times a day (QID) | INTRAMUSCULAR | Status: DC | PRN

## 2018-10-12 MED ORDER — SODIUM CHLORIDE 0.9 % IV SOLN: INTRAVENOUS | Status: DC

## 2018-10-12 MED ORDER — SODIUM CHLORIDE 0.9% FLUSH: 3.0000 mL | Freq: Two times a day (BID) | INTRAVENOUS | Status: DC

## 2018-10-12 MED ORDER — LIDOCAINE HCL (PF) 1 % IJ SOLN
INTRAMUSCULAR | Status: DC | PRN
  Administered 2018-10-12: 1.5 mL

## 2018-10-12 MED ORDER — HEPARIN (PORCINE) IN NACL 1000-0.9 UT/500ML-% IV SOLN
INTRAVENOUS | Status: AC
  Filled 2018-10-12: qty 500

## 2018-10-12 SURGICAL SUPPLY — 7 items
CATH SWAN GANZ 7F STRAIGHT (CATHETERS) ×1 IMPLANT
GLIDESHEATH SLENDER 7FR .021G (SHEATH) ×1 IMPLANT
PACK CARDIAC CATHETERIZATION (CUSTOM PROCEDURE TRAY) ×2 IMPLANT
SLEEVE REPOSITIONING LENGTH 30 (MISCELLANEOUS) ×1 IMPLANT
TRANSDUCER W/STOPCOCK (MISCELLANEOUS) ×2 IMPLANT
TUBING ART PRESS 72  MALE/FEM (TUBING) ×1
TUBING ART PRESS 72 MALE/FEM (TUBING) IMPLANT

## 2018-10-12 NOTE — Discharge Instructions (Signed)
Right Heart Catheterization, Care After This sheet gives you information about how to care for yourself after your procedure. Your health care provider may also give you more specific instructions. If you have problems or questions, contact your health care provider. What can I expect after the procedure? After the procedure, it is common to have:  Bruising or mild discomfort in the area where the IV was inserted (insertion site). Follow these instructions at home: Eating and drinking   Follow instructions from your health care provider about eating or drinking restrictions.  Drink a lot of fluids for the first several days after the procedure, as directed by your health care provider. This helps to wash (flush) the contrast out of your body. Examples of healthy fluids include water or low-calorie drinks. General instructions  Check your IV insertion area every day for signs of infection. Check for: ? Redness, swelling, or pain. ? Fluid or blood. ? Warmth. ? Pus or a bad smell.  Take over-the-counter and prescription medicines only as told by your health care provider.  Rest and return to your normal activities as told by your health care provider. Ask your health care provider what activities are safe for you.  Do not drive for 24 hours if you were given a medicine to help you relax (sedative), or until your health care provider approves.  Keep all follow-up visits as told by your health care provider. This is important. Contact a health care provider if:  Your skin becomes itchy or you develop a rash or hives.  You have a fever that does not get better with medicine.  You feel nauseous.  You vomit.  You have redness, swelling, or pain around the insertion site.  You have fluid or blood coming from the insertion site.  Your insertion area feels warm to the touch.  You have pus or a bad smell coming from the insertion site. Get help right away if:  You have difficulty  breathing or shortness of breath.  You develop chest pain.  You faint.  You feel very dizzy. These symptoms may represent a serious problem that is an emergency. Do not wait to see if the symptoms will go away. Get medical help right away. Call your local emergency services (911 in the [place] Do not drive yourself to the hospital. Summary  After your procedure, it is common to have bruising or mild discomfort in the area where the IV was inserted.  You should check your IV insertion area every day for signs of infection.  Take over-the-counter and prescription medicines only as told by your health care provider.  You should drink a lot of fluids for the first several days after the procedure to help flush the contrast from your body. This information is not intended to replace advice given to you by your health care provider. Make sure you discuss any questions you have with your health care provider. Document Released: 12/01/2012 Document Revised: 01/23/2017 Document Reviewed: 01/05/2016 Elsevier Patient Education  2020 [employer].

## 2018-10-12 NOTE — Interval H&P Note (Signed)
History and Physical Interval Note:  10/12/2018 12:52 PM  Brent [name]  has presented today for surgery, with the diagnosis of heart failure.  The various methods of treatment have been discussed with the patient and family. After consideration of risks, benefits and other options for treatment, the patient has consented to  Procedure(s): RIGHT HEART CATH (N/A) as a surgical intervention.  The patient's history has been reviewed, patient examined, no change in status, stable for surgery.  I have reviewed the patient's chart and labs.  Questions were answered to the patient's satisfaction.     [name]

## 2018-10-13 ENCOUNTER — Encounter (HOSPITAL_COMMUNITY): Payer: Self-pay | Admitting: Internal Medicine

## 2018-10-13 DIAGNOSIS — H8110 Benign paroxysmal vertigo, unspecified ear: Secondary | ICD-10-CM | POA: Diagnosis not present

## 2018-10-13 DIAGNOSIS — Z79899 Other long term (current) drug therapy: Secondary | ICD-10-CM | POA: Diagnosis not present

## 2018-10-13 DIAGNOSIS — Z9181 History of falling: Secondary | ICD-10-CM | POA: Diagnosis not present

## 2018-10-13 DIAGNOSIS — D631 Anemia in chronic kidney disease: Secondary | ICD-10-CM | POA: Diagnosis not present

## 2018-10-13 DIAGNOSIS — N184 Chronic kidney disease, stage 4 (severe): Secondary | ICD-10-CM | POA: Diagnosis not present

## 2018-10-13 DIAGNOSIS — J9601 Acute respiratory failure with hypoxia: Secondary | ICD-10-CM | POA: Diagnosis not present

## 2018-10-13 DIAGNOSIS — E440 Moderate protein-calorie malnutrition: Secondary | ICD-10-CM | POA: Diagnosis not present

## 2018-10-13 DIAGNOSIS — K50919 Crohn's disease, unspecified, with unspecified complications: Secondary | ICD-10-CM | POA: Diagnosis not present

## 2018-10-13 DIAGNOSIS — H6121 Impacted cerumen, right ear: Secondary | ICD-10-CM | POA: Diagnosis not present

## 2018-10-13 DIAGNOSIS — J8410 Pulmonary fibrosis, unspecified: Secondary | ICD-10-CM | POA: Diagnosis not present

## 2018-10-13 DIAGNOSIS — I130 Hypertensive heart and chronic kidney disease with heart failure and stage 1 through stage 4 chronic kidney disease, or unspecified chronic kidney disease: Secondary | ICD-10-CM | POA: Diagnosis not present

## 2018-10-13 DIAGNOSIS — R270 Ataxia, unspecified: Secondary | ICD-10-CM | POA: Diagnosis not present

## 2018-10-13 DIAGNOSIS — I5042 Chronic combined systolic (congestive) and diastolic (congestive) heart failure: Secondary | ICD-10-CM | POA: Diagnosis not present

## 2018-10-14 DIAGNOSIS — K50919 Crohn's disease, unspecified, with unspecified complications: Secondary | ICD-10-CM | POA: Diagnosis not present

## 2018-10-14 DIAGNOSIS — Z9181 History of falling: Secondary | ICD-10-CM | POA: Diagnosis not present

## 2018-10-14 DIAGNOSIS — N184 Chronic kidney disease, stage 4 (severe): Secondary | ICD-10-CM | POA: Diagnosis not present

## 2018-10-14 DIAGNOSIS — I5042 Chronic combined systolic (congestive) and diastolic (congestive) heart failure: Secondary | ICD-10-CM | POA: Diagnosis not present

## 2018-10-14 DIAGNOSIS — J8410 Pulmonary fibrosis, unspecified: Secondary | ICD-10-CM | POA: Diagnosis not present

## 2018-10-14 DIAGNOSIS — Z79899 Other long term (current) drug therapy: Secondary | ICD-10-CM | POA: Diagnosis not present

## 2018-10-14 DIAGNOSIS — H8110 Benign paroxysmal vertigo, unspecified ear: Secondary | ICD-10-CM | POA: Diagnosis not present

## 2018-10-14 DIAGNOSIS — R270 Ataxia, unspecified: Secondary | ICD-10-CM | POA: Diagnosis not present

## 2018-10-14 DIAGNOSIS — D631 Anemia in chronic kidney disease: Secondary | ICD-10-CM | POA: Diagnosis not present

## 2018-10-14 DIAGNOSIS — I130 Hypertensive heart and chronic kidney disease with heart failure and stage 1 through stage 4 chronic kidney disease, or unspecified chronic kidney disease: Secondary | ICD-10-CM | POA: Diagnosis not present

## 2018-10-14 DIAGNOSIS — E440 Moderate protein-calorie malnutrition: Secondary | ICD-10-CM | POA: Diagnosis not present

## 2018-10-14 DIAGNOSIS — J9601 Acute respiratory failure with hypoxia: Secondary | ICD-10-CM | POA: Diagnosis not present

## 2018-10-16 NOTE — Addendum Note (Signed)
Encounter addended by: Cammie Mcgee, [facility] on: 10/16/2018 3:32 PM  Actions taken: Clinical Note Signed

## 2018-10-20 DIAGNOSIS — D631 Anemia in chronic kidney disease: Secondary | ICD-10-CM | POA: Diagnosis not present

## 2018-10-20 DIAGNOSIS — J9601 Acute respiratory failure with hypoxia: Secondary | ICD-10-CM | POA: Diagnosis not present

## 2018-10-20 DIAGNOSIS — I130 Hypertensive heart and chronic kidney disease with heart failure and stage 1 through stage 4 chronic kidney disease, or unspecified chronic kidney disease: Secondary | ICD-10-CM | POA: Diagnosis not present

## 2018-10-20 DIAGNOSIS — H8110 Benign paroxysmal vertigo, unspecified ear: Secondary | ICD-10-CM | POA: Diagnosis not present

## 2018-10-20 DIAGNOSIS — J8410 Pulmonary fibrosis, unspecified: Secondary | ICD-10-CM | POA: Diagnosis not present

## 2018-10-20 DIAGNOSIS — Z9181 History of falling: Secondary | ICD-10-CM | POA: Diagnosis not present

## 2018-10-20 DIAGNOSIS — E440 Moderate protein-calorie malnutrition: Secondary | ICD-10-CM | POA: Diagnosis not present

## 2018-10-20 DIAGNOSIS — N184 Chronic kidney disease, stage 4 (severe): Secondary | ICD-10-CM | POA: Diagnosis not present

## 2018-10-20 DIAGNOSIS — Z79899 Other long term (current) drug therapy: Secondary | ICD-10-CM | POA: Diagnosis not present

## 2018-10-20 DIAGNOSIS — I5042 Chronic combined systolic (congestive) and diastolic (congestive) heart failure: Secondary | ICD-10-CM | POA: Diagnosis not present

## 2018-10-20 DIAGNOSIS — R270 Ataxia, unspecified: Secondary | ICD-10-CM | POA: Diagnosis not present

## 2018-10-20 DIAGNOSIS — K50919 Crohn's disease, unspecified, with unspecified complications: Secondary | ICD-10-CM | POA: Diagnosis not present

## 2018-10-21 DIAGNOSIS — E440 Moderate protein-calorie malnutrition: Secondary | ICD-10-CM | POA: Diagnosis not present

## 2018-10-21 DIAGNOSIS — D631 Anemia in chronic kidney disease: Secondary | ICD-10-CM | POA: Diagnosis not present

## 2018-10-21 DIAGNOSIS — H8110 Benign paroxysmal vertigo, unspecified ear: Secondary | ICD-10-CM | POA: Diagnosis not present

## 2018-10-21 DIAGNOSIS — K50919 Crohn's disease, unspecified, with unspecified complications: Secondary | ICD-10-CM | POA: Diagnosis not present

## 2018-10-21 DIAGNOSIS — N184 Chronic kidney disease, stage 4 (severe): Secondary | ICD-10-CM | POA: Diagnosis not present

## 2018-10-21 DIAGNOSIS — L84 Corns and callosities: Secondary | ICD-10-CM | POA: Diagnosis not present

## 2018-10-21 DIAGNOSIS — I739 Peripheral vascular disease, unspecified: Secondary | ICD-10-CM | POA: Diagnosis not present

## 2018-10-21 DIAGNOSIS — J9601 Acute respiratory failure with hypoxia: Secondary | ICD-10-CM | POA: Diagnosis not present

## 2018-10-21 DIAGNOSIS — Z9181 History of falling: Secondary | ICD-10-CM | POA: Diagnosis not present

## 2018-10-21 DIAGNOSIS — R270 Ataxia, unspecified: Secondary | ICD-10-CM | POA: Diagnosis not present

## 2018-10-21 DIAGNOSIS — I5042 Chronic combined systolic (congestive) and diastolic (congestive) heart failure: Secondary | ICD-10-CM | POA: Diagnosis not present

## 2018-10-21 DIAGNOSIS — I130 Hypertensive heart and chronic kidney disease with heart failure and stage 1 through stage 4 chronic kidney disease, or unspecified chronic kidney disease: Secondary | ICD-10-CM | POA: Diagnosis not present

## 2018-10-21 DIAGNOSIS — M7742 Metatarsalgia, left foot: Secondary | ICD-10-CM | POA: Diagnosis not present

## 2018-10-21 DIAGNOSIS — J8410 Pulmonary fibrosis, unspecified: Secondary | ICD-10-CM | POA: Diagnosis not present

## 2018-10-21 DIAGNOSIS — B351 Tinea unguium: Secondary | ICD-10-CM | POA: Diagnosis not present

## 2018-10-21 DIAGNOSIS — Z79899 Other long term (current) drug therapy: Secondary | ICD-10-CM | POA: Diagnosis not present

## 2018-10-26 DIAGNOSIS — H8110 Benign paroxysmal vertigo, unspecified ear: Secondary | ICD-10-CM | POA: Diagnosis not present

## 2018-10-26 DIAGNOSIS — I130 Hypertensive heart and chronic kidney disease with heart failure and stage 1 through stage 4 chronic kidney disease, or unspecified chronic kidney disease: Secondary | ICD-10-CM | POA: Diagnosis not present

## 2018-10-26 DIAGNOSIS — D631 Anemia in chronic kidney disease: Secondary | ICD-10-CM | POA: Diagnosis not present

## 2018-10-26 DIAGNOSIS — J9601 Acute respiratory failure with hypoxia: Secondary | ICD-10-CM | POA: Diagnosis not present

## 2018-10-26 DIAGNOSIS — K50919 Crohn's disease, unspecified, with unspecified complications: Secondary | ICD-10-CM | POA: Diagnosis not present

## 2018-10-26 DIAGNOSIS — I5042 Chronic combined systolic (congestive) and diastolic (congestive) heart failure: Secondary | ICD-10-CM | POA: Diagnosis not present

## 2018-10-26 DIAGNOSIS — E440 Moderate protein-calorie malnutrition: Secondary | ICD-10-CM | POA: Diagnosis not present

## 2018-10-26 DIAGNOSIS — Z9181 History of falling: Secondary | ICD-10-CM | POA: Diagnosis not present

## 2018-10-26 DIAGNOSIS — Z79899 Other long term (current) drug therapy: Secondary | ICD-10-CM | POA: Diagnosis not present

## 2018-10-26 DIAGNOSIS — R270 Ataxia, unspecified: Secondary | ICD-10-CM | POA: Diagnosis not present

## 2018-10-26 DIAGNOSIS — J8410 Pulmonary fibrosis, unspecified: Secondary | ICD-10-CM | POA: Diagnosis not present

## 2018-10-26 DIAGNOSIS — N184 Chronic kidney disease, stage 4 (severe): Secondary | ICD-10-CM | POA: Diagnosis not present

## 2018-10-27 ENCOUNTER — Encounter (HOSPITAL_COMMUNITY): Payer: Self-pay | Admitting: Internal Medicine

## 2018-10-28 DIAGNOSIS — Z9181 History of falling: Secondary | ICD-10-CM | POA: Diagnosis not present

## 2018-10-28 DIAGNOSIS — H8110 Benign paroxysmal vertigo, unspecified ear: Secondary | ICD-10-CM | POA: Diagnosis not present

## 2018-10-28 DIAGNOSIS — J9601 Acute respiratory failure with hypoxia: Secondary | ICD-10-CM | POA: Diagnosis not present

## 2018-10-28 DIAGNOSIS — J8410 Pulmonary fibrosis, unspecified: Secondary | ICD-10-CM | POA: Diagnosis not present

## 2018-10-28 DIAGNOSIS — D631 Anemia in chronic kidney disease: Secondary | ICD-10-CM | POA: Diagnosis not present

## 2018-10-28 DIAGNOSIS — Z79899 Other long term (current) drug therapy: Secondary | ICD-10-CM | POA: Diagnosis not present

## 2018-10-28 DIAGNOSIS — N184 Chronic kidney disease, stage 4 (severe): Secondary | ICD-10-CM | POA: Diagnosis not present

## 2018-10-28 DIAGNOSIS — I5042 Chronic combined systolic (congestive) and diastolic (congestive) heart failure: Secondary | ICD-10-CM | POA: Diagnosis not present

## 2018-10-28 DIAGNOSIS — K50919 Crohn's disease, unspecified, with unspecified complications: Secondary | ICD-10-CM | POA: Diagnosis not present

## 2018-10-28 DIAGNOSIS — R270 Ataxia, unspecified: Secondary | ICD-10-CM | POA: Diagnosis not present

## 2018-10-28 DIAGNOSIS — E440 Moderate protein-calorie malnutrition: Secondary | ICD-10-CM | POA: Diagnosis not present

## 2018-10-28 DIAGNOSIS — I130 Hypertensive heart and chronic kidney disease with heart failure and stage 1 through stage 4 chronic kidney disease, or unspecified chronic kidney disease: Secondary | ICD-10-CM | POA: Diagnosis not present

## 2018-11-02 ENCOUNTER — Telehealth: Payer: Self-pay | Admitting: *Deleted

## 2018-11-02 ENCOUNTER — Telehealth (HOSPITAL_COMMUNITY): Payer: Self-pay | Admitting: *Deleted

## 2018-11-02 ENCOUNTER — Telehealth (HOSPITAL_COMMUNITY): Payer: Self-pay

## 2018-11-02 DIAGNOSIS — Z9181 History of falling: Secondary | ICD-10-CM | POA: Diagnosis not present

## 2018-11-02 DIAGNOSIS — R270 Ataxia, unspecified: Secondary | ICD-10-CM | POA: Diagnosis not present

## 2018-11-02 DIAGNOSIS — E440 Moderate protein-calorie malnutrition: Secondary | ICD-10-CM | POA: Diagnosis not present

## 2018-11-02 DIAGNOSIS — D631 Anemia in chronic kidney disease: Secondary | ICD-10-CM | POA: Diagnosis not present

## 2018-11-02 DIAGNOSIS — I130 Hypertensive heart and chronic kidney disease with heart failure and stage 1 through stage 4 chronic kidney disease, or unspecified chronic kidney disease: Secondary | ICD-10-CM | POA: Diagnosis not present

## 2018-11-02 DIAGNOSIS — N184 Chronic kidney disease, stage 4 (severe): Secondary | ICD-10-CM | POA: Diagnosis not present

## 2018-11-02 DIAGNOSIS — J9601 Acute respiratory failure with hypoxia: Secondary | ICD-10-CM | POA: Diagnosis not present

## 2018-11-02 DIAGNOSIS — H8110 Benign paroxysmal vertigo, unspecified ear: Secondary | ICD-10-CM | POA: Diagnosis not present

## 2018-11-02 DIAGNOSIS — Z79899 Other long term (current) drug therapy: Secondary | ICD-10-CM | POA: Diagnosis not present

## 2018-11-02 DIAGNOSIS — J8410 Pulmonary fibrosis, unspecified: Secondary | ICD-10-CM | POA: Diagnosis not present

## 2018-11-02 DIAGNOSIS — K50919 Crohn's disease, unspecified, with unspecified complications: Secondary | ICD-10-CM | POA: Diagnosis not present

## 2018-11-02 DIAGNOSIS — I5042 Chronic combined systolic (congestive) and diastolic (congestive) heart failure: Secondary | ICD-10-CM | POA: Diagnosis not present

## 2018-11-02 MED ORDER — IVABRADINE HCL 5 MG PO TABS: 2.5000 mg | ORAL_TABLET | Freq: Two times a day (BID) | ORAL | 3 refills | Status: DC

## 2018-11-02 NOTE — Telephone Encounter (Signed)
Dyann Ruddle with [facility] home [facility] called in order to report a few things for this pt. States that the pt is tachy at 110, other vitals within normal limits and pt asymptomatic. ivabradine not currently on pt med list. Added per [name] PharmD. Pt has decreased appetite and cannot seem to remember what/ if he eats. Pt states that he is taking his medications as prescribed but his pill box is still full from 4 days ago. Home health RN requested that visits be increased up to 2 times a week. Gave verbal order for that under Dr. [name] if PCP refuses. Will refer pt to paramedicine. Pt has follow up in 1 week.

## 2018-11-02 NOTE — Telephone Encounter (Signed)
Ilene with [facility] called and stated that patient's Heart Rate was 110. Patient is having poor eating and can't remember what he ate or did yesterday. No history of memory issues. Nurse stated that patient's pill box has 4 1/2 days which she filled for him last week, so he is not taking his medications correctly. Nurse is wanting to increase his Nursing Visits to 3 times/week for medication management. Please Advise.   Nurse has also placed call to patient's Cardiologist.

## 2018-11-02 NOTE — Telephone Encounter (Signed)
He has history of sinus tachycardia. Increase ivabradine to 5 mg bid.  He needs followup appt.

## 2018-11-02 NOTE — Telephone Encounter (Signed)
Dyann Ruddle, RN called to report pts heart rate is holing at 110. All other vitals fine but she has to report when heart rate is that elevated.  Routed to [place]

## 2018-11-03 NOTE — Telephone Encounter (Signed)
Brent [name] with [facility] notified and agreed.

## 2018-11-03 NOTE — Telephone Encounter (Signed)
Of course, three times weekly med management is fine.  I called cardiology and spoke with [place], RN to get more info about the paramedicine program and to let them know my concerns about Mr. [name] overall decline over the past year and multiple hospitalizations with CHF.  Apparently the paramedic team may evaluate his vitals and check on his medications, also, and communicate back with the CHF clinic.  I note that he's a veteran and may qualify for some additional care this way--if a family member steps up to do aide and attendance or even for [facility] [facility] perhaps.  No family has been involved in his care.  His bipolar disorder does complicate things at times and, certainly, withdrawal from his psychiatric meds along with uncontrolled HR may explain his recent mental status changes.  I suggested considering palliative care since more advanced options like LVAD, IV meds, etc would be challenging to impossible with his psychiatric illness and lack of family support.  I'm happy to speak with the cardiology team in more detail.   Lakeasha Petion L. Tab Rylee, D.O. [facility] [address],  [address] Cell Phone (Mon-Fri 8am-5pm):  [phone] On Call:  [phone] & follow prompts after 5pm & weekends Office Phone:  [phone] Office Fax:  [phone]

## 2018-11-03 NOTE — Telephone Encounter (Signed)
Called pt no answer will try again later.

## 2018-11-05 ENCOUNTER — Telehealth (HOSPITAL_COMMUNITY): Payer: Self-pay

## 2018-11-05 ENCOUNTER — Telehealth (HOSPITAL_COMMUNITY): Payer: Self-pay | Admitting: Pharmacist

## 2018-11-05 MED ORDER — IVABRADINE HCL 5 MG PO TABS: 2.5000 mg | ORAL_TABLET | Freq: Two times a day (BID) | ORAL | 3 refills | Status: DC

## 2018-11-05 NOTE — Telephone Encounter (Signed)
I called [name] to schedule initial CHP visit; His phone hung up while he was checking his schedule. I left a voicemail for him to return my call.

## 2018-11-05 NOTE — Telephone Encounter (Signed)
Spoke to patient on phone regarding initiating Corlanor Sending rx to [facility] with coupon card info Patient understands directions and agreeable to start new medication  Bonnita Nasuti Pharm.D. CPP, BCPS Clinical Pharmacist [phone] 11/05/2018 10:01 AM

## 2018-11-05 NOTE — Telephone Encounter (Signed)
Please [name], RN note  From 9/8 below  Brent [name] with [facility] home [facility] called in order to report a few things for this pt. States that the pt is tachy at 110, other vitals within normal limits and pt asymptomatic. ivabradine not currently on pt med list. Added per [name] PharmD. Pt has decreased appetite and cannot seem to remember what/ if he eats. Pt states that he is taking his medications as prescribed but his pill box is still full from 4 days ago. Home health RN requested that visits be increased up to 2 times a week. Gave verbal order for that under Dr. [name] if PCP refuses. Will refer pt to paramedicine. Pt has follow up in 1 week.

## 2018-11-08 ENCOUNTER — Ambulatory Visit (HOSPITAL_COMMUNITY)
Admission: RE | Admit: 2018-11-08 | Discharge: 2018-11-08 | Disposition: A | Discharge status: Home / Self Care | Payer: Medicare Other | Source: Ambulatory Visit | Attending: Nephrology | Admitting: Nephrology

## 2018-11-08 ENCOUNTER — Other Ambulatory Visit: Payer: Self-pay

## 2018-11-08 DIAGNOSIS — D631 Anemia in chronic kidney disease: Secondary | ICD-10-CM | POA: Diagnosis not present

## 2018-11-08 DIAGNOSIS — N183 Chronic kidney disease, stage 3 (moderate): Secondary | ICD-10-CM | POA: Diagnosis not present

## 2018-11-08 LAB — FERRITIN: Ferritin: 728 ng/mL — ABNORMAL HIGH (ref 24–336)

## 2018-11-08 LAB — IRON AND TIBC
Iron: 63 ug/dL (ref 45–182)
Saturation Ratios: 28 % (ref 17.9–39.5)
TIBC: 223 ug/dL — ABNORMAL LOW (ref 250–450)
UIBC: 160 ug/dL

## 2018-11-08 LAB — HEMOGLOBIN AND HEMATOCRIT, BLOOD
HCT: 32 % — ABNORMAL LOW (ref 39.0–52.0)
Hemoglobin: 9.7 g/dL — ABNORMAL LOW (ref 13.0–17.0)

## 2018-11-08 MED ORDER — EPOETIN ALFA 10000 UNIT/ML IJ SOLN
10000.0000 [IU] | Freq: Once | INTRAMUSCULAR | Status: AC
  Administered 2018-11-08: 11:00:00 10000 [IU] via SUBCUTANEOUS
  Filled 2018-11-08: qty 1

## 2018-11-08 NOTE — Discharge Instructions (Signed)

## 2018-11-08 NOTE — Progress Notes (Signed)
PATIENT CARE CENTER NOTE  Diagnosis:Anemia associated with Chronic Renal Failure, anemia associated with renal disease  Provider:Patel, Ulice Dash MD   Procedure:Epoetin Alfa (Procrit) injection   Note:Patient received sub-qProcritinjection in left arm. Tolerated well. Labs drawn pre-injection and Hemoglobin was 9.7. Patient BP was120/67.Discharge instructions given. Patient alert, oriented and ambulatory at discharge.

## 2018-11-09 ENCOUNTER — Telehealth (HOSPITAL_COMMUNITY): Payer: Self-pay | Admitting: Pharmacy Technician

## 2018-11-09 ENCOUNTER — Telehealth (HOSPITAL_COMMUNITY): Payer: Self-pay

## 2018-11-09 ENCOUNTER — Telehealth (HOSPITAL_COMMUNITY): Payer: Self-pay | Admitting: Pharmacist

## 2018-11-09 DIAGNOSIS — I130 Hypertensive heart and chronic kidney disease with heart failure and stage 1 through stage 4 chronic kidney disease, or unspecified chronic kidney disease: Secondary | ICD-10-CM | POA: Diagnosis not present

## 2018-11-09 DIAGNOSIS — K50919 Crohn's disease, unspecified, with unspecified complications: Secondary | ICD-10-CM | POA: Diagnosis not present

## 2018-11-09 DIAGNOSIS — D631 Anemia in chronic kidney disease: Secondary | ICD-10-CM | POA: Diagnosis not present

## 2018-11-09 DIAGNOSIS — J9601 Acute respiratory failure with hypoxia: Secondary | ICD-10-CM | POA: Diagnosis not present

## 2018-11-09 DIAGNOSIS — Z79899 Other long term (current) drug therapy: Secondary | ICD-10-CM | POA: Diagnosis not present

## 2018-11-09 DIAGNOSIS — H8110 Benign paroxysmal vertigo, unspecified ear: Secondary | ICD-10-CM | POA: Diagnosis not present

## 2018-11-09 DIAGNOSIS — Z9181 History of falling: Secondary | ICD-10-CM | POA: Diagnosis not present

## 2018-11-09 DIAGNOSIS — N184 Chronic kidney disease, stage 4 (severe): Secondary | ICD-10-CM | POA: Diagnosis not present

## 2018-11-09 DIAGNOSIS — J8410 Pulmonary fibrosis, unspecified: Secondary | ICD-10-CM | POA: Diagnosis not present

## 2018-11-09 DIAGNOSIS — I5042 Chronic combined systolic (congestive) and diastolic (congestive) heart failure: Secondary | ICD-10-CM | POA: Diagnosis not present

## 2018-11-09 DIAGNOSIS — E440 Moderate protein-calorie malnutrition: Secondary | ICD-10-CM | POA: Diagnosis not present

## 2018-11-09 DIAGNOSIS — R270 Ataxia, unspecified: Secondary | ICD-10-CM | POA: Diagnosis not present

## 2018-11-09 NOTE — Telephone Encounter (Signed)
   Please call and instruct to bring all medications to the HF clinic.   Ask Karena Addison with Paramedicine to come to the appointment.   Ammar Moffatt NP-C

## 2018-11-09 NOTE — Telephone Encounter (Signed)
Spoke with Sharion Settler from [facility] reporting that patient has had a 3.6lb weight increase in 1 week.  Patient vital signs are stable and patient does not endorse any complaints.  He reports to her that he is eating well and doing better.  She does have concerns because he appears to be confused about his medications despite having a pill box set up.  There was an extra medication in his box that was not supposed to be there.  She removed it and reiterated med instructions.  Patient has an appt tomorrow with HF clinic.  Pt has paramedicine however still has confusion regarding how to take his medicine. Forwarded to NP

## 2018-11-09 NOTE — Telephone Encounter (Signed)
I called [name] to remind him of his appointment tomorrow at the [facility] @ 3.  He said that he has too many medications to bring with him but he will bring his medication list.

## 2018-11-09 NOTE — Telephone Encounter (Signed)
Patient Advocate Encounter   Received notification from [employer] that prior authorization for Ivabradine is required.   PA submitted on CoverMyMeds Key: [id number] Status is pending   Will continue to follow.   Audry Riles, PharmD, BCPS, CPP Heart Failure Clinic Pharmacist [phone]

## 2018-11-09 NOTE — Telephone Encounter (Signed)
Advised patient to bring medicines to office tomorrow for review while at appt. Verbalized understanding. Also left a detailed message with Karena Addison to be present tomorrow at his appointment.

## 2018-11-09 NOTE — Telephone Encounter (Signed)
Prior Authorization for Corlanor 56m has been approved.    PA# [id number] dates: 11/09/2018 through 02/24/2019  Patients co-pay is $100.00  Lauren and I will work on getting him patient assistance [facility]   Will continue to follow.  ECharlann Boxer CPhT

## 2018-11-10 ENCOUNTER — Other Ambulatory Visit: Payer: Self-pay

## 2018-11-10 ENCOUNTER — Telehealth (HOSPITAL_COMMUNITY): Payer: Self-pay | Admitting: Licensed Clinical Social Worker

## 2018-11-10 ENCOUNTER — Encounter (HOSPITAL_COMMUNITY): Payer: Self-pay | Admitting: Cardiology

## 2018-11-10 ENCOUNTER — Ambulatory Visit (HOSPITAL_COMMUNITY)
Admission: RE | Admit: 2018-11-10 | Discharge: 2018-11-10 | Disposition: A | Discharge status: Home / Self Care | Payer: Medicare Other | Source: Ambulatory Visit | Attending: Cardiology | Admitting: Cardiology

## 2018-11-10 VITALS — BP 91/50 | HR 103 | Wt 176.8 lb

## 2018-11-10 DIAGNOSIS — F319 Bipolar disorder, unspecified: Secondary | ICD-10-CM | POA: Insufficient documentation

## 2018-11-10 DIAGNOSIS — N183 Chronic kidney disease, stage 3 (moderate): Secondary | ICD-10-CM | POA: Diagnosis not present

## 2018-11-10 DIAGNOSIS — I5022 Chronic systolic (congestive) heart failure: Secondary | ICD-10-CM | POA: Insufficient documentation

## 2018-11-10 DIAGNOSIS — Z79899 Other long term (current) drug therapy: Secondary | ICD-10-CM | POA: Diagnosis not present

## 2018-11-10 DIAGNOSIS — I5042 Chronic combined systolic (congestive) and diastolic (congestive) heart failure: Secondary | ICD-10-CM | POA: Diagnosis not present

## 2018-11-10 DIAGNOSIS — M109 Gout, unspecified: Secondary | ICD-10-CM | POA: Diagnosis not present

## 2018-11-10 DIAGNOSIS — K5090 Crohn's disease, unspecified, without complications: Secondary | ICD-10-CM | POA: Insufficient documentation

## 2018-11-10 DIAGNOSIS — J439 Emphysema, unspecified: Secondary | ICD-10-CM | POA: Diagnosis not present

## 2018-11-10 DIAGNOSIS — I130 Hypertensive heart and chronic kidney disease with heart failure and stage 1 through stage 4 chronic kidney disease, or unspecified chronic kidney disease: Secondary | ICD-10-CM | POA: Diagnosis not present

## 2018-11-10 DIAGNOSIS — K900 Celiac disease: Secondary | ICD-10-CM | POA: Insufficient documentation

## 2018-11-10 DIAGNOSIS — Z87891 Personal history of nicotine dependence: Secondary | ICD-10-CM | POA: Diagnosis not present

## 2018-11-10 DIAGNOSIS — J449 Chronic obstructive pulmonary disease, unspecified: Secondary | ICD-10-CM | POA: Diagnosis not present

## 2018-11-10 LAB — BASIC METABOLIC PANEL
Anion gap: 7 (ref 5–15)
BUN: 23 mg/dL (ref 8–23)
CO2: 22 mmol/L (ref 22–32)
Calcium: 8.5 mg/dL — ABNORMAL LOW (ref 8.9–10.3)
Chloride: 109 mmol/L (ref 98–111)
Creatinine, Ser: 2.14 mg/dL — ABNORMAL HIGH (ref 0.61–1.24)
GFR calc Af Amer: 34 mL/min — ABNORMAL LOW (ref >60)
GFR calc non Af Amer: 30 mL/min — ABNORMAL LOW (ref >60)
Glucose, Bld: 98 mg/dL (ref 70–99)
Potassium: 4.1 mmol/L (ref 3.5–5.1)
Sodium: 138 mmol/L (ref 135–145)

## 2018-11-10 LAB — BRAIN NATRIURETIC PEPTIDE: B Natriuretic Peptide: 501.7 pg/mL — ABNORMAL HIGH (ref 0.0–100.0)

## 2018-11-10 MED ORDER — IVABRADINE HCL 5 MG PO TABS: 5.0000 mg | ORAL_TABLET | Freq: Two times a day (BID) | ORAL | Status: DC

## 2018-11-10 NOTE — Progress Notes (Signed)
PCP: Gayland Curry, DO Cardiology: Dr. Leandro Reasoner Brent [name] is a 73 y.o. with a history of Crohn's disease, celiac disease,HTN, COPD,priortobacco use, CKD 3, chronic anemia, and bipolar disorder.Diagnosed with systolic HF in 9/75 (echo with EF 30-35%).   Admitted 3/11-3/16/20 with SOB and hypoxia. Found to have PNA and required BIPAP and antibiotics. Blood cultures were negative. Echo showed newly reduced EF 30-35%. Cardiology consulted and he had nuclear stress test, which was read as "high risk" due to low EF. Per Dr Sallyanne Kuster, did not think changes were indicative of coronary disease and cath was not pursued due this and due to CKD. HF meds optimized. Limited with CKD3. He had one short run of NSVT. Also had AKI on CKD3, but creatinine improved to baseline 1.77 on day of discharge.  He was seen in ED 05/12/18 with SOB after misplacing his nebulizer. BNP was elevated, so he was given 40 mg IV lasix and discharged with lasix 20 mg BID. Referred to HF clinic.  He was admitted again in 7/20 with weakness, orthostatic hypotension, and AKI.  Cardiac meds were held.  He was noted to be severely deconditioned, and he was discharged to SNF.  He remained in SNF x 2 wks and is now home.   Echo was done in 8/20, EF remains 20-25% with mild LVH and normal RV.  He had RHC in 8/20 showing normal filling pressures and good cardiac output (surprisingly).   He returns today for followup of CHF.  We are still trying to get him ivabradine. BP soft today at 91/50.  He is breathing better.  He is now walking around the block at home and getting stronger.  No lightheadedness or dizziness.  No falls.  No chest pain.  No orthopnea/PND.  He is getting PT at home. Weight is up about 10 lbs, he is eating better.   Labs (7/20): K 5.1, creatinine 2.25 Labs (8/20): K 4.8, creatinine 2.65 Labs (9/20): hgb 9.7  ECG (8/20, personally reviewed): ST at 115, LVH with repolarization abnormality, narrow QRS   REDS clip  31%  PMH: 1. Crohns disease: Gets Remicade.  2. Celiac disease.  3. COPD: Prior smoker.  - CT chest (7/20) with mild emphysema.  4. CKD: Stage 3.  5. HTN 6. Bipolar disorder 7. H/o BPPV 8. Gout 9. Chronic systolic CHF: Etiology uncertain.  - Echo (3/20): EF 30-35%, mild LVH, normal RV.  - Cardiolite (3/20): EF 28%, no ischemia, fixed defects in anteroseptal and inferolateral walls.  - Echo (8/20): EF 20-25%, diffuse hypokinesis, mild LVH, normal RV size and systolic function, normal IVC size.  - RHC (8/20): mean RA 3, PA 21/10, mean PCWP 5, CI 3.2 Fick and 3.5 Thermo  Social History   Socioeconomic History  . Marital status: Divorced    Spouse name: Not on file  . Number of children: 1  . Years of education: Not on file  . Highest education level: Not on file  Occupational History  . Occupation: retired  . Occupation: [employer]  . Financial resource strain: Not hard at all  . Food insecurity    Worry: Never true    Inability: Never true  . Transportation needs    Medical: No    Non-medical: No  Tobacco Use  . Smoking status: Former Smoker    Packs/day: 1.00    Years: 49.00    Pack years: 49.00    Types: Cigarettes    Start date: 04/11/1956  Quit date: 04/17/2014    Years since quitting: 4.5  . Smokeless tobacco: Never Used  Substance and Sexual Activity  . Alcohol use: No    Alcohol/week: 0.0 standard drinks  . Drug use: No  . Sexual activity: Never  Lifestyle  . Physical activity    Days per week: 0 days    Minutes per session: 0 min  . Stress: Not at all  Relationships  . Social connections    Talks on phone: More than three times a week    Gets together: Three times a week    Attends religious service: More than 4 times per year    Active member of club or organization: Yes    Attends meetings of clubs or organizations: 1 to 4 times per year    Relationship status: Divorced  . Intimate partner violence    Fear of current or ex partner:  No    Emotionally abused: No    Physically abused: No    Forced sexual activity: No  Other Topics Concern  . Not on file  Social History Narrative  . Not on file   Family History  Problem Relation Age of Onset  . Diabetes Mother        maternal grandmother  . Uterine cancer Mother   . Emphysema Father   . Pneumonia Maternal Grandmother   . Colon cancer Neg Hx    ROS: All systems reviewed and negative except as per HPI.   Current Outpatient Medications  Medication Sig Dispense Refill  . allopurinol (ZYLOPRIM) 100 MG tablet Take 2 tablets (200 mg total) by mouth daily. 60 tablet 5  . cholecalciferol (VITAMIN D) 1000 units tablet Take 1,000 Units by mouth daily.     . cyanocobalamin 1000 MCG tablet Take 1,000 mcg by mouth daily.    . divalproex (DEPAKOTE) 500 MG EC tablet Take 1,500 mg by mouth at bedtime.     Marland Kitchen epoetin alfa (PROCRIT) 16109 UNIT/ML injection Inject 10,000 Units into the skin every 30 (thirty) days.     . ferrous sulfate 325 (65 FE) MG tablet Take 325 mg by mouth every Monday, Wednesday, and Friday.     . furosemide (LASIX) 20 MG tablet Take 1 tablet (20 mg total) by mouth every other day. 36 tablet 3  . hydrALAZINE (APRESOLINE) 10 MG tablet Take 1 tablet (10 mg total) by mouth 2 (two) times a day. 60 tablet 0  . inFLIXimab (REMICADE) 100 MG injection Infuse Remicade IV schedule 1 9m/kg every 8 weeks Premedicate with Tylenol 500-6587mby mouth and Benadryl 25-5027my mouth prior to infusion. Last PPD was on 12/2009.  1 each 6  . isosorbide mononitrate (IMDUR) 30 MG 24 hr tablet Take 1 tablet (30 mg total) by mouth daily. 30 tablet 0  . loperamide (IMODIUM) 2 MG capsule Take 4 mg by mouth daily as needed for diarrhea or loose stools.    . magnesium gluconate (MAGONATE) 30 MG tablet Take 1 tablet (30 mg total) by mouth every Monday, Wednesday, and Friday. (Patient taking differently: Take 400 mg by mouth 3 (three) times daily. ) 10 tablet 0  . metoprolol succinate  (TOPROL XL) 25 MG 24 hr tablet Take 1 tablet (25 mg total) by mouth at bedtime. 30 tablet 11  . OLANZapine (ZYPREXA) 7.5 MG tablet Take 1 tablet (7.5 mg total) by mouth at bedtime. 30 tablet 11  . omeprazole (PRILOSEC) 20 MG capsule Take 20 mg by mouth daily before supper.      .Marland Kitchen  polyethylene glycol (MIRALAX / GLYCOLAX) packet Take 17 g by mouth daily as needed for mild constipation. 14 each 0  . tamsulosin (FLOMAX) 0.4 MG CAPS capsule Take 0.4 mg by mouth daily.     Current Facility-Administered Medications  Medication Dose Route Frequency Provider Last Rate Last Dose  . ivabradine (CORLANOR) tablet 5 mg  5 mg Oral BID WC Larey Dresser, MD       BP (!) 91/50   Pulse (!) 103   Wt 80.2 kg (176 lb 12.8 oz)   SpO2 98%   BMI 21.52 kg/m  General: NAD, thin Neck: No JVD, no thyromegaly or thyroid nodule.  Lungs: Clear to auscultation bilaterally with normal respiratory effort. CV: Nondisplaced PMI.  Heart mildly tachy, regular S1/S2, no S3/S4, no murmur.  No peripheral edema.  No carotid bruit.  Normal pedal pulses.  Abdomen: Soft, nontender, no hepatosplenomegaly, no distention.  Skin: Intact without lesions or rashes.  Neurologic: Alert and oriented x 3.  Psych: Normal affect. Extremities: No clubbing or cyanosis.  HEENT: Normal.   Assessment/Plan: 1. Chronic systolic CHF: Diagnosed by echo in 3/20, EF 30-35%.  Cardiolite showed fixed defects but no ischemia.  Etiology of cardiomyopathy is uncertain, he has not had cath or MRI due to elevated creatinine. He has no chest pain.  Medication titration has been limited by orthostasis/low BP.  Echo in 8/20 showed EF 20-25% with mild LVH and normal RV function.  With resting sinus tachycardia, I was concerned for low output HF, but RHC in 8/20 showed normal cardiac output and filling pressures. Today, NYHA class II (improved) with no volume overload by exam or by REDS clip (even though weight is up).  - Continue current hydralazine/Imdur, no BP  room to titrate.  - Continue Toprol XL 25 mg daily, take in evening before bed.  - Continue Lasix three days/week.  - No ACEI/ARB/ARNI with low BP and elevated creatinine.  - If creatinine trends down < 2, would plan for coronary angiography. - I am trying to get him approved for Corlanor 5 mg bid with ongoing mild sinus tachycardia, would avoid pushing beta blocker farther with low BP.  - He would be a difficult candidate for advanced therapies with deconditioning, renal dysfunction, and lives alone.  2. CKD: Stage 3.  BMET today.  3. COPD: Mild emphysema on prior chest CT.   - I will arrange for PFTs.    Followup 3 wks with NP.   Loralie Champagne 11/10/2018

## 2018-11-10 NOTE — Patient Instructions (Signed)
Labs were done today. We will only contact you if anything is ABNORMAL. NO CALL IS A GOOD CALL!!!   An appointment will be scheduled for a full Pulmonary Panel.  Your Physician would like you to follow up with the Clinic in 3 weeks.  At the [facility], you and your health needs are our priority. As part of our continuing mission to provide you with exceptional heart care, we have created designated Provider Care Teams. These Care Teams include your primary Cardiologist (physician) and Advanced Practice Providers (APPs- Physician Assistants and Nurse Practitioners) who all work together to provide you with the care you need, when you need it.   You may see any of the following providers on your designated Care Team at your next follow up: Marland Kitchen Dr Glori Bickers . Dr Loralie Champagne . Darrick Grinder, NP   Please be sure to bring in all your medications bottles to every appointment.

## 2018-11-10 NOTE — Telephone Encounter (Signed)
Paramedicine Initial Assessment:  Housing:  In what kind of housing do you live? House/apt/trailer/shelter? apartment  Do you rent/pay a mortgage/own? Rent  Do you live with anyone? alone  Are you currently worried about losing your housing? No- been there 27  Within the past 12 months have you ever stayed outside, in a car, tent, a shelter, or temporarily with someone? no  Within the past 12 months have you been unable to get utilities when it was really needed? no   Social:  What is your current marital status? divorced  Do you have any children? 1 son  Do you have family or friends who live locally? Yes son lives nearby and a sister- primary support is sister.  Food:  Within the past 12 months were you ever worried that food would run out before you got money to buy more? no  Within the past 5month have you run out of food and didn't have money to buy more? no  Income:  What is your current source of income? VA benefits and SS benefits He is unsure if he is currently active with [employer] [facility] How hard is it for you to pay for the basics like food housing, medical care, and utilities? Not hard at all  Do you have outstanding medical bills? no  Insurance:  Are you currently insured? Yes- [employer]  Do you have prescription coverage? yes  If no insurance, have you applied for coverage (Medicaid, disability, marketplace etc)? n/a  Transportation:  Do you have transportation to your medical appointments? yes   If yes, how? Private vehicle  In the past 12 months has lack of transportation kept you from medical appts or from getting medications? no  In the past 12 months has lack of transportation kept you from meetings, work, or getting things you needed? no   Daily Health Needs: Do you have a working scale at home? Yes- [facility] How do you manage your medications at home? Uses a pill box  Do you ever take your medications differently  than prescribed? no  Do you have issues affording your medications? Currently concerned about Corlanor which he was informed would be $100 copay but working with pharmacy tech to apply for patient assistance.  If yes, has this ever prevented you from obtaining medications? no  Do you have any concerns with mobility at home? No- almost had to get a walker following recent admission but states he recovered and can do everything independently  Do you use any assistive devices at home or have PCS at home? no   Are there any additional barriers you see to getting the care you need? no  CSW will continue to follow through paramedicine program and assist as needed.  JJorge Ny LCSW Clinical Social Worker [facility] Desk#: [phone]#: [phone]

## 2018-11-11 DIAGNOSIS — I130 Hypertensive heart and chronic kidney disease with heart failure and stage 1 through stage 4 chronic kidney disease, or unspecified chronic kidney disease: Secondary | ICD-10-CM | POA: Diagnosis not present

## 2018-11-11 DIAGNOSIS — J9601 Acute respiratory failure with hypoxia: Secondary | ICD-10-CM | POA: Diagnosis not present

## 2018-11-11 DIAGNOSIS — K50919 Crohn's disease, unspecified, with unspecified complications: Secondary | ICD-10-CM | POA: Diagnosis not present

## 2018-11-11 DIAGNOSIS — Z9181 History of falling: Secondary | ICD-10-CM | POA: Diagnosis not present

## 2018-11-11 DIAGNOSIS — R270 Ataxia, unspecified: Secondary | ICD-10-CM | POA: Diagnosis not present

## 2018-11-11 DIAGNOSIS — J8410 Pulmonary fibrosis, unspecified: Secondary | ICD-10-CM | POA: Diagnosis not present

## 2018-11-11 DIAGNOSIS — E440 Moderate protein-calorie malnutrition: Secondary | ICD-10-CM | POA: Diagnosis not present

## 2018-11-11 DIAGNOSIS — D631 Anemia in chronic kidney disease: Secondary | ICD-10-CM | POA: Diagnosis not present

## 2018-11-11 DIAGNOSIS — H8110 Benign paroxysmal vertigo, unspecified ear: Secondary | ICD-10-CM | POA: Diagnosis not present

## 2018-11-11 DIAGNOSIS — I5042 Chronic combined systolic (congestive) and diastolic (congestive) heart failure: Secondary | ICD-10-CM | POA: Diagnosis not present

## 2018-11-11 DIAGNOSIS — N184 Chronic kidney disease, stage 4 (severe): Secondary | ICD-10-CM | POA: Diagnosis not present

## 2018-11-11 DIAGNOSIS — Z79899 Other long term (current) drug therapy: Secondary | ICD-10-CM | POA: Diagnosis not present

## 2018-11-15 ENCOUNTER — Other Ambulatory Visit: Payer: Self-pay

## 2018-11-15 ENCOUNTER — Encounter: Payer: Self-pay | Admitting: Internal Medicine

## 2018-11-15 ENCOUNTER — Ambulatory Visit (INDEPENDENT_AMBULATORY_CARE_PROVIDER_SITE_OTHER): Payer: Medicare Other | Admitting: Internal Medicine

## 2018-11-15 VITALS — BP 112/62 | HR 95 | Temp 98.1°F | Ht 76.0 in | Wt 168.2 lb

## 2018-11-15 DIAGNOSIS — R630 Anorexia: Secondary | ICD-10-CM

## 2018-11-15 DIAGNOSIS — N183 Chronic kidney disease, stage 3 unspecified: Secondary | ICD-10-CM

## 2018-11-15 DIAGNOSIS — Z23 Encounter for immunization: Secondary | ICD-10-CM

## 2018-11-15 DIAGNOSIS — D631 Anemia in chronic kidney disease: Secondary | ICD-10-CM

## 2018-11-15 DIAGNOSIS — R000 Tachycardia, unspecified: Secondary | ICD-10-CM | POA: Diagnosis not present

## 2018-11-15 DIAGNOSIS — I5042 Chronic combined systolic (congestive) and diastolic (congestive) heart failure: Secondary | ICD-10-CM | POA: Diagnosis not present

## 2018-11-15 DIAGNOSIS — F319 Bipolar disorder, unspecified: Secondary | ICD-10-CM

## 2018-11-15 NOTE — Patient Instructions (Addendum)
Try to increase your food intake at breakfast and/or lunch.  I think that will help your energy levels.    Please look if your MOST form is in the discharge paperwork from the hospital or nursing home.  I'd like to get a copy of this.

## 2018-11-15 NOTE — Progress Notes (Signed)
Location:  [place] [facility]   Advanced Directives 11/15/2018  Does Patient Have a Medical Advance Directive? Yes  Type of Paramedic of [place];Out of facility DNR (pink MOST or yellow form)  Does patient want to make changes to medical advance directive? No - Patient declined  Copy of [place] in Chart? No - copy requested  Would patient like information on creating a medical advance directive? -  Pre-existing out of facility DNR order (yellow form or pink MOST form) Pink MOST form placed in chart (order not valid for inpatient use)     Chief Complaint  Patient presents with  . Medical Management of Chronic Issues    Followup of heart failure and appetite.  . Immunizations    Flu shot given today.    HPI: Patient is a 73 y.o. male seen today for medical management of chronic diseases.    He was hospitalized in mid-August due to heart failure. Since admission, he has been resting at home well. He has been working with physical therapy at home a few times a week. He denies being short of breath with exertion anymore. Does not need any assistive devices to ambulate. No recent falls or injuries reported.   After hospital admission, he received a kit from [employer] to help monitor his heart failure. He is now weighing himself daily, takes his bp daily, and talks with a nurse from [employer] often.   His diet is still poor. He eats cereal for breakfast, fruit cup for lunch and lean protein with vegetable for dinner. Follow a low sodium diet. Also follows his fluid restriction instructions.   He is often tired and feels like he has no energy. He states he is bored everyday. Sleeping an average of 11-12 hours daily. Sister and friend, Marchia Bond are his two biggest support systems. He talks with them often.   Requesting flu vaccine.     Past Medical History:  Diagnosis Date  . Anemia   . Anxiety   . Benign paroxysmal positional  vertigo   . Bipolar I disorder, most recent episode (or current) unspecified   . Celiac disease   . Chronic kidney disease, stage III (moderate) (HCC)   . Crohn's    Remicade q8 weeks  . Depression   . Essential and other specified forms of tremor    medication-induced Parkinson's, now resolved  . Essential hypertension, benign   . Gout 2018  . Heart failure ([place])   . Hypertrophy of prostate without urinary obstruction and other lower urinary tract symptoms (LUTS)   . Impotence of organic origin   . Insomnia with sleep apnea, unspecified   . Iron deficiency anemia, unspecified   . 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 Neuralgia, neuritis, and radiculitis, unspecified   . Other B-complex deficiencies   . Other extrapyramidal disease and abnormal movement disorder   . Postinflammatory pulmonary fibrosis ([place])   . Tobacco use disorder   . Vertigo 2018    Past Surgical History:  Procedure Laterality Date  . CATARACT EXTRACTION, BILATERAL Bilateral 09/2018  . CHOLECYSTECTOMY  07-12-2010  . RIGHT HEART CATH N/A 10/12/2018   Procedure: RIGHT HEART CATH;  Surgeon: Jolaine Artist, MD;  Location: [facility];  Service: Cardiovascular;  Laterality: N/A;  . SMALL INTESTINE SURGERY     x 2    Allergies  Allergen Reactions  . Azathioprine Other (See Comments)    REACTION: affected WBC "Almost died"  . Ciprofloxacin Other (  See Comments)    Unknown rxn  . Levaquin [Levofloxacin In D5w] Other (See Comments)    Unknown rxn  . Plendil [Felodipine] Other (See Comments)    Unknown rxn    Outpatient Encounter Medications as of 11/15/2018  Medication Sig  . allopurinol (ZYLOPRIM) 100 MG tablet Take 2 tablets (200 mg total) by mouth daily.  . cholecalciferol (VITAMIN D) 1000 units tablet Take 1,000 Units by mouth daily.   . cyanocobalamin 1000 MCG tablet Take 1,000 mcg by mouth daily.  . divalproex (DEPAKOTE) 500 MG EC tablet Take 1,500 mg by mouth at bedtime.   Marland Kitchen epoetin alfa (PROCRIT) 66599  UNIT/ML injection Inject 10,000 Units into the skin every 30 (thirty) days.   . ferrous sulfate 325 (65 FE) MG tablet Take 325 mg by mouth every Monday, Wednesday, and Friday.   . furosemide (LASIX) 20 MG tablet Take 1 tablet (20 mg total) by mouth every other day.  . hydrALAZINE (APRESOLINE) 10 MG tablet Take 10 mg by mouth 2 (two) times daily.  Marland Kitchen inFLIXimab (REMICADE) 100 MG injection Infuse Remicade IV schedule 1 63m/kg every 8 weeks Premedicate with Tylenol 500-657mby mouth and Benadryl 25-5043my mouth prior to infusion. Last PPD was on 12/2009.   . isosorbide mononitrate (IMDUR) 30 MG 24 hr tablet Take 1 tablet (30 mg total) by mouth daily.  . lMarland Kitchenperamide (IMODIUM) 2 MG capsule Take 4 mg by mouth daily as needed for diarrhea or loose stools.  . magnesium gluconate (MAGONATE) 30 MG tablet Take 1 tablet (30 mg total) by mouth every Monday, Wednesday, and Friday.  . metoprolol succinate (TOPROL-XL) 25 MG 24 hr tablet Take 25 mg by mouth at bedtime.   . OMarland KitchenANZapine (ZYPREXA) 7.5 MG tablet Take 1 tablet (7.5 mg total) by mouth at bedtime.  . oMarland Kitcheneprazole (PRILOSEC) 20 MG capsule Take 20 mg by mouth daily before supper.    . polyethylene glycol (MIRALAX / GLYCOLAX) packet Take 17 g by mouth daily as needed for mild constipation.  . tamsulosin (FLOMAX) 0.4 MG CAPS capsule Take 0.4 mg by mouth daily.  . [DISCONTINUED] hydrALAZINE (APRESOLINE) 10 MG tablet Take 1 tablet (10 mg total) by mouth 2 (two) times a day.  . [DISCONTINUED] metoprolol succinate (TOPROL XL) 25 MG 24 hr tablet Take 1 tablet (25 mg total) by mouth at bedtime.   No facility-administered encounter medications on file as of 11/15/2018.     Review of Systems:  Review of Systems  Constitutional: Positive for activity change, appetite change and fatigue.  HENT: Negative for dental problem, hearing loss and trouble swallowing.   Eyes: Negative for photophobia and visual disturbance.  Respiratory: Negative for cough, shortness of  breath and wheezing.   Cardiovascular: Negative for chest pain, palpitations and leg swelling.  Gastrointestinal: Positive for diarrhea. Negative for abdominal pain, constipation and nausea.  Endocrine: Negative for polydipsia, polyphagia and polyuria.  Genitourinary: Positive for frequency. Negative for dysuria and hematuria.  Musculoskeletal: Negative for arthralgias.  Skin: Negative.   Neurological: Negative for dizziness, weakness and headaches.  Psychiatric/Behavioral: Positive for dysphoric mood. Negative for sleep disturbance. The patient is not nervous/anxious.     Health Maintenance  Topic Date Due  . Hepatitis C Screening  01/29/2019 (Originally 2/110/17/1947. TETANUS/TDAP  05/25/2021  . COLONOSCOPY  09/17/2027  . INFLUENZA VACCINE  Completed  . PNA vac Low Risk Adult  Completed    Physical Exam: Vitals:   11/15/18 1448  BP: 112/62  Pulse: 95  Temp:  98.1 F (36.7 C)  TempSrc: Oral  SpO2: 96%  Weight: 168 lb 3.2 oz (76.3 kg)  Height: 6' 4"  (1.93 m)   Body mass index is 20.47 kg/m. Physical Exam Vitals signs reviewed.  Constitutional:      Appearance: Normal appearance. He is normal weight.  HENT:     Head: Normocephalic.  Neck:     Musculoskeletal: Normal range of motion.     Thyroid: No thyroid mass or thyromegaly.     Vascular: No carotid bruit.  Neurological:     Mental Status: He is alert.     Labs reviewed: Basic Metabolic Panel: Recent Labs    05/07/18 0336 05/08/18 0256 05/09/18 0251 05/10/18 0330  09/01/18 8144 09/02/18 0514  09/08/18 0248  09/29/18 1108 10/11/18 1253 10/12/18 1550 10/12/18 1555 11/10/18 1504  NA 141 141 143 142   < > 138 139   < > 139   < > 143 141 150*  144 145 138  K 3.9 3.9 3.9 4.1   < > 4.5 4.0   < > 3.9   < > 5.3* 4.8 3.6  4.3 4.1 4.1  CL 108 108 112* 112*   < > 111 110   < > 113*   < > 115* 114*  --   --  109  CO2 25 26 27 25    < > 15* 15*   < > 16*   < > 21* 19*  --   --  22  GLUCOSE 100* 99 95 86   < >  100* 84   < > 95   < > 97 116*  --   --  98  BUN 25* 28* 26* 20   < > 51* 67*   < > 57*   < > 36* 39*  --   --  23  CREATININE 1.90* 2.13* 1.86* 1.82*   < > 2.42* 2.46*   < > 2.44*   < > 2.09* 2.65*  --   --  2.14*  CALCIUM 8.6* 8.5* 8.1* 8.4*   < > 7.3* 7.6*   < > 9.0   < > 8.8* 9.0  --   --  8.5*  MG 1.4* 1.9 1.8 1.8  --  1.0* 1.0*  --  1.6*  --   --   --   --   --   --   PHOS 2.7 3.0 3.1 3.0  --   --   --   --   --   --   --   --   --   --   --   TSH 0.202*  --   --   --   --  0.379  --   --  0.744  --   --   --   --   --   --    < > = values in this interval not displayed.   Liver Function Tests: Recent Labs    09/02/18 0514 09/08/18 0248 09/09/18 0443  AST 21 13* 12*  ALT 20 11 10   ALKPHOS 70 61 50  BILITOT 0.2* 0.5 0.4  PROT 7.6 6.7 6.3*  ALBUMIN 3.6 3.0* 2.7*   No results for input(s): LIPASE, AMYLASE in the last 8760 hours. No results for input(s): AMMONIA in the last 8760 hours. CBC: Recent Labs    08/31/18 0134 09/02/18 0514  09/07/18 1708 09/08/18 0248  10/12/18 1230 10/12/18 1550 10/12/18 1555 11/08/18 1005  WBC 12.3* 11.0*  --  10.8* 9.0  --  10.6*  --   --   --   NEUTROABS 10.3* 6.6  --   --  5.4  --   --   --   --   --   HGB 9.7* 10.9*   < > 11.0* 10.7*   < > 9.5* 8.5*  9.9* 8.5* 9.7*  HCT 31.5* 35.3*   < > 35.8* 33.6*  32.0*   < > 31.3* 25.0*  29.0* 25.0* 32.0*  MCV 109.4* 106.3*  --  108.5* 104.7*  --  114.2*  --   --   --   PLT 199 247  --  237 201  --  202  --   --   --    < > = values in this interval not displayed.   Lipid Panel: No results for input(s): CHOL, HDL, LDLCALC, TRIG, CHOLHDL, LDLDIRECT in the last 8760 hours. Lab Results  Component Value Date   HGBA1C 4.9 08/10/2015    Procedures since last visit: No results found.  Assessment/Plan 1. Need for influenza vaccination -administer Flu Vaccine QUAD High Dose(Fluad)  2.Chronic combined systolic and diastolic congestive heart failure ([place]) - followed by Dr. [name] and [facility] - stable, ongoing - continues to take lasix every other day, no bilateral lower leg edema present today - Dyspnea on exertion has improved with physical therapy - trying to get Corlanlor medication covered for mild sinus tachycardia - upcoming PFT scheduled by Dr. [name]  3. Loss of appetite for more than 2 weeks - Megace discontinued last visit - he continues to only eat one full meal daily - he has maintained his weight - recommend eating a full meal at lunch over supplemental shakes due to fluid restrictions  4. Chronic kidney disease (CKD), stage III(Moderate) (HCC) - avoid nephrotoxic drugs like NSAIDS, and adjust medication dosages to be renally excreted - hydrate within given fluid restrictions  5. Anemia in stage 3 chronic kidney disease (HCC) - stable, ongoing - seen by nephrology and is on procrit - hemoglobin 9.7,  has improved since hospitalization   6. Tachycardia - followed by Dr. [name], trying to get Corlanor medication approved through insurance  7. Bipolar 1 disorder ([place]) - reports increased sleeping due to boredom- most likely related to anemia and heart failure - has good supportive connections - continue depakote and zyprexa at current dosages - advised to see PCP if symptoms of depression persist   Labs/tests ordered: none Next appt:  01/19/2019

## 2018-11-16 ENCOUNTER — Other Ambulatory Visit (HOSPITAL_COMMUNITY)
Admission: RE | Admit: 2018-11-16 | Discharge: 2018-11-16 | Disposition: A | Discharge status: Home / Self Care | Payer: Medicare Other | Source: Ambulatory Visit | Attending: Cardiology | Admitting: Cardiology

## 2018-11-16 DIAGNOSIS — Z20828 Contact with and (suspected) exposure to other viral communicable diseases: Secondary | ICD-10-CM | POA: Insufficient documentation

## 2018-11-16 DIAGNOSIS — Z01812 Encounter for preprocedural laboratory examination: Secondary | ICD-10-CM | POA: Diagnosis not present

## 2018-11-17 ENCOUNTER — Telehealth (HOSPITAL_COMMUNITY): Payer: Self-pay | Admitting: Pharmacist

## 2018-11-17 DIAGNOSIS — K50919 Crohn's disease, unspecified, with unspecified complications: Secondary | ICD-10-CM | POA: Diagnosis not present

## 2018-11-17 DIAGNOSIS — Z9181 History of falling: Secondary | ICD-10-CM | POA: Diagnosis not present

## 2018-11-17 DIAGNOSIS — I130 Hypertensive heart and chronic kidney disease with heart failure and stage 1 through stage 4 chronic kidney disease, or unspecified chronic kidney disease: Secondary | ICD-10-CM | POA: Diagnosis not present

## 2018-11-17 DIAGNOSIS — I5042 Chronic combined systolic (congestive) and diastolic (congestive) heart failure: Secondary | ICD-10-CM | POA: Diagnosis not present

## 2018-11-17 DIAGNOSIS — H8110 Benign paroxysmal vertigo, unspecified ear: Secondary | ICD-10-CM | POA: Diagnosis not present

## 2018-11-17 DIAGNOSIS — J8410 Pulmonary fibrosis, unspecified: Secondary | ICD-10-CM | POA: Diagnosis not present

## 2018-11-17 DIAGNOSIS — N184 Chronic kidney disease, stage 4 (severe): Secondary | ICD-10-CM | POA: Diagnosis not present

## 2018-11-17 DIAGNOSIS — D631 Anemia in chronic kidney disease: Secondary | ICD-10-CM | POA: Diagnosis not present

## 2018-11-17 DIAGNOSIS — J9601 Acute respiratory failure with hypoxia: Secondary | ICD-10-CM | POA: Diagnosis not present

## 2018-11-17 DIAGNOSIS — E440 Moderate protein-calorie malnutrition: Secondary | ICD-10-CM | POA: Diagnosis not present

## 2018-11-17 DIAGNOSIS — Z79899 Other long term (current) drug therapy: Secondary | ICD-10-CM | POA: Diagnosis not present

## 2018-11-17 DIAGNOSIS — R270 Ataxia, unspecified: Secondary | ICD-10-CM | POA: Diagnosis not present

## 2018-11-17 LAB — NOVEL CORONAVIRUS, NAA (HOSP ORDER, SEND-OUT TO REF LAB; TAT 18-24 HRS): SARS-CoV-2, NAA: NOT DETECTED

## 2018-11-17 NOTE — Telephone Encounter (Signed)
Advanced Heart Failure Patient Advocate Encounter   Patient was approved to receive Corlanor from [facility]  Patient ID: [id number] Effective dates: 11/17/2018 through 02/24/2019  Spoke with patient and he is aware that [facility] will contact him to schedule shipment.    Audry Riles, PharmD, BCPS, CPP Heart Failure Clinic Pharmacist [phone]

## 2018-11-18 DIAGNOSIS — Z9181 History of falling: Secondary | ICD-10-CM | POA: Diagnosis not present

## 2018-11-18 DIAGNOSIS — H8110 Benign paroxysmal vertigo, unspecified ear: Secondary | ICD-10-CM | POA: Diagnosis not present

## 2018-11-18 DIAGNOSIS — J8410 Pulmonary fibrosis, unspecified: Secondary | ICD-10-CM | POA: Diagnosis not present

## 2018-11-18 DIAGNOSIS — D631 Anemia in chronic kidney disease: Secondary | ICD-10-CM | POA: Diagnosis not present

## 2018-11-18 DIAGNOSIS — I130 Hypertensive heart and chronic kidney disease with heart failure and stage 1 through stage 4 chronic kidney disease, or unspecified chronic kidney disease: Secondary | ICD-10-CM | POA: Diagnosis not present

## 2018-11-18 DIAGNOSIS — R270 Ataxia, unspecified: Secondary | ICD-10-CM | POA: Diagnosis not present

## 2018-11-18 DIAGNOSIS — E440 Moderate protein-calorie malnutrition: Secondary | ICD-10-CM | POA: Diagnosis not present

## 2018-11-18 DIAGNOSIS — K50919 Crohn's disease, unspecified, with unspecified complications: Secondary | ICD-10-CM | POA: Diagnosis not present

## 2018-11-18 DIAGNOSIS — Z79899 Other long term (current) drug therapy: Secondary | ICD-10-CM | POA: Diagnosis not present

## 2018-11-18 DIAGNOSIS — J9601 Acute respiratory failure with hypoxia: Secondary | ICD-10-CM | POA: Diagnosis not present

## 2018-11-18 DIAGNOSIS — I5042 Chronic combined systolic (congestive) and diastolic (congestive) heart failure: Secondary | ICD-10-CM | POA: Diagnosis not present

## 2018-11-18 DIAGNOSIS — N184 Chronic kidney disease, stage 4 (severe): Secondary | ICD-10-CM | POA: Diagnosis not present

## 2018-11-19 ENCOUNTER — Ambulatory Visit (HOSPITAL_COMMUNITY)
Admission: RE | Admit: 2018-11-19 | Discharge: 2018-11-19 | Disposition: A | Discharge status: Home / Self Care | Payer: Medicare Other | Source: Ambulatory Visit | Attending: Cardiology | Admitting: Cardiology

## 2018-11-19 ENCOUNTER — Other Ambulatory Visit (HOSPITAL_COMMUNITY): Payer: Self-pay

## 2018-11-19 ENCOUNTER — Other Ambulatory Visit: Payer: Self-pay

## 2018-11-19 DIAGNOSIS — J449 Chronic obstructive pulmonary disease, unspecified: Secondary | ICD-10-CM | POA: Diagnosis not present

## 2018-11-19 LAB — PULMONARY FUNCTION TEST
DL/VA % pred: 86 %
DL/VA: 3.39 ml/min/mmHg/L
DLCO unc % pred: 62 %
DLCO unc: 18.85 ml/min/mmHg
FEF 25-75 Post: 2.47 L/sec
FEF 25-75 Pre: 3.87 L/sec
FEF2575-%Change-Post: -36 %
FEF2575-%Pred-Post: 83 %
FEF2575-%Pred-Pre: 131 %
FEV1-%Change-Post: -13 %
FEV1-%Pred-Post: 62 %
FEV1-%Pred-Pre: 73 %
FEV1-Post: 2.25 L
FEV1-Pre: 2.61 L
FEV1FVC-%Change-Post: -18 %
FEV1FVC-%Pred-Pre: 127 %
FEV6-%Change-Post: 6 %
FEV6-%Pred-Post: 63 %
FEV6-%Pred-Pre: 59 %
FEV6-Post: 2.87 L
FEV6-Pre: 2.7 L
FEV6FVC-%Pred-Post: 104 %
FEV6FVC-%Pred-Pre: 104 %
FVC-%Change-Post: 6 %
FVC-%Pred-Post: 60 %
FVC-%Pred-Pre: 57 %
FVC-Post: 2.87 L
FVC-Pre: 2.7 L
Post FEV1/FVC ratio: 78 %
Post FEV6/FVC ratio: 100 %
Pre FEV1/FVC ratio: 97 %
Pre FEV6/FVC Ratio: 100 %
RV % pred: 105 %
RV: 2.99 L
TLC % pred: 76 %
TLC: 6.31 L

## 2018-11-19 MED ORDER — ALBUTEROL SULFATE (2.5 MG/3ML) 0.083% IN NEBU
2.5000 mg | INHALATION_SOLUTION | Freq: Once | RESPIRATORY_TRACT | Status: AC
  Administered 2018-11-19: 11:00:00 2.5 mg via RESPIRATORY_TRACT

## 2018-11-19 NOTE — Progress Notes (Signed)
Paramedicine Encounter    Patient ID: Brent [name], male    DOB: March 20, 1945, 73 y.o.   MRN: [id number]    Patient Care Team: Gayland Curry, DO as PCP - General (Geriatric Medicine) Martinique, Peter M, MD as PCP - Cardiology (Cardiology) Larey Dresser, MD as PCP - Advanced Heart Failure (Cardiology) Elmarie Shiley, MD (Nephrology) Milus Banister, MD (Gastroenterology)  Patient Active Problem List   Diagnosis Date Noted  . Metabolic acidosis 76/16/0737  . Orthostasis 09/07/2018  . AKI (acute kidney injury) ([place])   . Immunosuppressed status ([place])   . Macrocytic anemia   . Chronic combined systolic and diastolic congestive heart failure ([place]) 06/03/2018  . Candida esophagitis ([place]) 09/22/2017  . History of smoking 30 or more pack years 01/05/2017  . Bipolar 1 disorder ([place])   . BPH (benign prostatic hyperplasia) 03/31/2013  . 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 Essential hypertension, benign 03/31/2013  . Acute renal failure superimposed on stage 3 chronic kidney disease ([place]) 06/04/2012  . 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[place]) 01/23/2010    Current Outpatient Medications:  .  allopurinol (ZYLOPRIM) 100 MG tablet, Take 2 tablets (200 mg total) by mouth daily., Disp: 60 tablet, Rfl: 5 .  cholecalciferol (VITAMIN D) 1000 units tablet, Take 1,000 Units by mouth daily. , Disp: , Rfl:  .  divalproex (DEPAKOTE) 500 MG EC tablet, Take 1,500 mg by mouth at bedtime. , Disp: , Rfl:  .  ferrous sulfate 325 (65 FE) MG tablet, Take 325 mg by mouth every Monday, Wednesday, and Friday. , Disp: , Rfl:  .  furosemide (LASIX) 20 MG tablet, Take 1 tablet (20 mg total) by mouth every other day., Disp: 36 tablet, Rfl: 3 .  hydrALAZINE (APRESOLINE) 10 MG tablet, Take 10 mg by mouth 2 (two) times daily., Disp: , Rfl:  .  inFLIXimab (REMICADE) 100 MG injection, Infuse Remicade IV schedule 1 86m/kg every 8 weeks Premedicate with Tylenol 500-6515mby mouth and Benadryl 25-5059my mouth prior to  infusion. Last PPD was on 12/2009. , Disp: 1 each, Rfl: 6 .  isosorbide mononitrate (IMDUR) 30 MG 24 hr tablet, Take 1 tablet (30 mg total) by mouth daily., Disp: 30 tablet, Rfl: 0 .  metoprolol succinate (TOPROL-XL) 25 MG 24 hr tablet, Take 25 mg by mouth at bedtime. , Disp: , Rfl:  .  OLANZapine (ZYPREXA) 7.5 MG tablet, Take 1 tablet (7.5 mg total) by mouth at bedtime., Disp: 30 tablet, Rfl: 11 .  omeprazole (PRILOSEC) 20 MG capsule, Take 20 mg by mouth daily before supper.  , Disp: , Rfl:  .  cyanocobalamin 1000 MCG tablet, Take 1,000 mcg by mouth daily., Disp: , Rfl:  .  epoetin alfa (PROCRIT) 10010626IT/ML injection, Inject 10,000 Units into the skin every 30 (thirty) days. , Disp: , Rfl:  .  loperamide (IMODIUM) 2 MG capsule, Take 4 mg by mouth daily as needed for diarrhea or loose stools., Disp: , Rfl:  .  magnesium gluconate (MAGONATE) 30 MG tablet, Take 1 tablet (30 mg total) by mouth every Monday, Wednesday, and Friday., Disp: 10 tablet, Rfl: 0 .  polyethylene glycol (MIRALAX / GLYCOLAX) packet, Take 17 g by mouth daily as needed for mild constipation., Disp: 14 each, Rfl: 0 .  tamsulosin (FLOMAX) 0.4 MG CAPS capsule, Take 0.4 mg by mouth daily., Disp: , Rfl:  Allergies  Allergen Reactions  . Azathioprine Other (See Comments)    REACTION: affected WBC "Almost died"  . Ciprofloxacin  Other (See Comments)    Unknown rxn  . Levaquin [Levofloxacin In D5w] Other (See Comments)    Unknown rxn  . Plendil [Felodipine] Other (See Comments)    Unknown rxn     Social History   Socioeconomic History  . Marital status: Divorced    Spouse name: Not on file  . Number of children: 1  . Years of education: Not on file  . Highest education level: Not on file  Occupational History  . Occupation: retired  . Occupation: [employer]  . Financial resource strain: Not hard at all  . Food insecurity    Worry: Never true    Inability: Never true  . Transportation needs    Medical:  No    Non-medical: No  Tobacco Use  . Smoking status: Former Smoker    Packs/day: 1.00    Years: 49.00    Pack years: 49.00    Types: Cigarettes    Start date: 04/11/1956    Quit date: 04/17/2014    Years since quitting: 4.5  . Smokeless tobacco: Never Used  Substance and Sexual Activity  . Alcohol use: No    Alcohol/week: 0.0 standard drinks  . Drug use: No  . Sexual activity: Never  Lifestyle  . Physical activity    Days per week: 0 days    Minutes per session: 0 min  . Stress: Not at all  Relationships  . Social connections    Talks on phone: More than three times a week    Gets together: Three times a week    Attends religious service: More than 4 times per year    Active member of club or organization: Yes    Attends meetings of clubs or organizations: 1 to 4 times per year    Relationship status: Divorced  . Intimate partner violence    Fear of current or ex partner: No    Emotionally abused: No    Physically abused: No    Forced sexual activity: No  Other Topics Concern  . Not on file  Social History Narrative  . Not on file    Physical Exam Pulmonary:     Effort: No respiratory distress.     Breath sounds: No wheezing or rales.  Abdominal:     General: There is no distension.  Musculoskeletal:     Right lower leg: No edema.     Left lower leg: No edema.  Skin:    General: Skin is warm and dry.         Future Appointments  Date Time Provider [place]  12/01/2018 11:00 AM [facility] PA/NP [facility] None  12/08/2018 10:00 AM [facility] None  01/07/2019 10:00 AM [facility] None  01/19/2019  1:30 PM [name], Dinah C, NP [facility] None  02/14/2019  2:15 PM Reed, Tiffany L, DO [facility] None     BP 104/70 (BP Location: Right Arm, Patient Position: Sitting, Cuff Size: Normal)   Pulse 96   Temp 98.4 F (36.9 C)   Wt 166 lb 8 oz (75.5 kg)   SpO2 98%   BMI 20.27 kg/m   Initial visit  ATF pt CAO x4 sitting in his apartment  with no complaints. He has his medications that he's taking currently in a bag; he has a [facility] drawer full of medications that he's saved from the [place].  He agreed to allow me to go through them next week and get rid of the expired meds.  He stated that he filled the pill box himself this week;  We went over his medications and talked about how to read the bottles.  He made a few errors, which I corrected.  He said that his sister lives near by and comes to visit him.  His son also comes by on a regular basis.  He drives and has dependable transportation to his appointments. He doesn't have any issues with affording food. His medications are delivered from the [place] but he has several from the [facility].    He said that he weighs daily but doesn't write his weights down.  He understands the importance of writing down his weight and keeping up with his medications.  He denies difficulty sleeping at night, no sob, no chest and no dizziness noted today.  Rx bottles verified and pill box refilled. He agrees to [facility] visits.   Dr. [name] said that 800 magnesium oxide daily (2 tabs) instead of 3 times a day.    Medication ordered: Hydralazine  Loghan Subia, EMT Paramedic [phone] 11/19/2018    ACTION: Home visit completed

## 2018-11-22 ENCOUNTER — Telehealth (HOSPITAL_COMMUNITY): Payer: Self-pay

## 2018-11-22 DIAGNOSIS — R270 Ataxia, unspecified: Secondary | ICD-10-CM

## 2018-11-22 DIAGNOSIS — J849 Interstitial pulmonary disease, unspecified: Secondary | ICD-10-CM

## 2018-11-22 DIAGNOSIS — J9601 Acute respiratory failure with hypoxia: Secondary | ICD-10-CM

## 2018-11-22 DIAGNOSIS — D631 Anemia in chronic kidney disease: Secondary | ICD-10-CM | POA: Diagnosis not present

## 2018-11-22 DIAGNOSIS — E440 Moderate protein-calorie malnutrition: Secondary | ICD-10-CM

## 2018-11-22 DIAGNOSIS — J8410 Pulmonary fibrosis, unspecified: Secondary | ICD-10-CM

## 2018-11-22 DIAGNOSIS — I130 Hypertensive heart and chronic kidney disease with heart failure and stage 1 through stage 4 chronic kidney disease, or unspecified chronic kidney disease: Secondary | ICD-10-CM | POA: Diagnosis not present

## 2018-11-22 DIAGNOSIS — N184 Chronic kidney disease, stage 4 (severe): Secondary | ICD-10-CM | POA: Diagnosis not present

## 2018-11-22 DIAGNOSIS — I5042 Chronic combined systolic (congestive) and diastolic (congestive) heart failure: Secondary | ICD-10-CM | POA: Diagnosis not present

## 2018-11-22 NOTE — Telephone Encounter (Signed)
-----   Message from Larey Dresser, MD sent at 11/21/2018  9:17 PM EDT ----- Mild obstruction suggesting COPD but also with restrictive lung disease.  Would like high resolution noncontrast chest CT to look for interstitial lung disease.

## 2018-11-22 NOTE — Telephone Encounter (Signed)
Pt called back, results of PFT's provided. Pt advised he needs a more definitive study.  Pt ordered for High Resolution Chest CT scan. Pt verbalized understanding he will get a call to schedule this appointment.

## 2018-11-23 ENCOUNTER — Other Ambulatory Visit (HOSPITAL_COMMUNITY): Payer: Self-pay | Admitting: *Deleted

## 2018-11-23 DIAGNOSIS — I5042 Chronic combined systolic (congestive) and diastolic (congestive) heart failure: Secondary | ICD-10-CM | POA: Diagnosis not present

## 2018-11-23 DIAGNOSIS — D631 Anemia in chronic kidney disease: Secondary | ICD-10-CM | POA: Diagnosis not present

## 2018-11-23 DIAGNOSIS — R270 Ataxia, unspecified: Secondary | ICD-10-CM | POA: Diagnosis not present

## 2018-11-23 DIAGNOSIS — Z9181 History of falling: Secondary | ICD-10-CM | POA: Diagnosis not present

## 2018-11-23 DIAGNOSIS — J9601 Acute respiratory failure with hypoxia: Secondary | ICD-10-CM | POA: Diagnosis not present

## 2018-11-23 DIAGNOSIS — K50919 Crohn's disease, unspecified, with unspecified complications: Secondary | ICD-10-CM | POA: Diagnosis not present

## 2018-11-23 DIAGNOSIS — I130 Hypertensive heart and chronic kidney disease with heart failure and stage 1 through stage 4 chronic kidney disease, or unspecified chronic kidney disease: Secondary | ICD-10-CM | POA: Diagnosis not present

## 2018-11-23 DIAGNOSIS — H8110 Benign paroxysmal vertigo, unspecified ear: Secondary | ICD-10-CM | POA: Diagnosis not present

## 2018-11-23 DIAGNOSIS — N184 Chronic kidney disease, stage 4 (severe): Secondary | ICD-10-CM | POA: Diagnosis not present

## 2018-11-23 DIAGNOSIS — J8410 Pulmonary fibrosis, unspecified: Secondary | ICD-10-CM | POA: Diagnosis not present

## 2018-11-23 DIAGNOSIS — Z79899 Other long term (current) drug therapy: Secondary | ICD-10-CM | POA: Diagnosis not present

## 2018-11-23 DIAGNOSIS — E440 Moderate protein-calorie malnutrition: Secondary | ICD-10-CM | POA: Diagnosis not present

## 2018-11-24 DIAGNOSIS — J8410 Pulmonary fibrosis, unspecified: Secondary | ICD-10-CM | POA: Diagnosis not present

## 2018-11-24 DIAGNOSIS — H8110 Benign paroxysmal vertigo, unspecified ear: Secondary | ICD-10-CM | POA: Diagnosis not present

## 2018-11-24 DIAGNOSIS — I5042 Chronic combined systolic (congestive) and diastolic (congestive) heart failure: Secondary | ICD-10-CM | POA: Diagnosis not present

## 2018-11-24 DIAGNOSIS — K50919 Crohn's disease, unspecified, with unspecified complications: Secondary | ICD-10-CM | POA: Diagnosis not present

## 2018-11-24 DIAGNOSIS — J9601 Acute respiratory failure with hypoxia: Secondary | ICD-10-CM | POA: Diagnosis not present

## 2018-11-24 DIAGNOSIS — Z9181 History of falling: Secondary | ICD-10-CM | POA: Diagnosis not present

## 2018-11-24 DIAGNOSIS — D631 Anemia in chronic kidney disease: Secondary | ICD-10-CM | POA: Diagnosis not present

## 2018-11-24 DIAGNOSIS — I130 Hypertensive heart and chronic kidney disease with heart failure and stage 1 through stage 4 chronic kidney disease, or unspecified chronic kidney disease: Secondary | ICD-10-CM | POA: Diagnosis not present

## 2018-11-24 DIAGNOSIS — N184 Chronic kidney disease, stage 4 (severe): Secondary | ICD-10-CM | POA: Diagnosis not present

## 2018-11-24 DIAGNOSIS — R270 Ataxia, unspecified: Secondary | ICD-10-CM | POA: Diagnosis not present

## 2018-11-24 DIAGNOSIS — Z79899 Other long term (current) drug therapy: Secondary | ICD-10-CM | POA: Diagnosis not present

## 2018-11-24 DIAGNOSIS — E440 Moderate protein-calorie malnutrition: Secondary | ICD-10-CM | POA: Diagnosis not present

## 2018-11-26 ENCOUNTER — Telehealth (HOSPITAL_COMMUNITY): Payer: Self-pay

## 2018-11-26 NOTE — Telephone Encounter (Signed)
[name] called and said that he'll be tied up for a while today.  We rescheduled the appointment to Tuesday next week.

## 2018-11-26 NOTE — Telephone Encounter (Signed)
I called [name] to schedule a time for a visit today. He said that he'll call me when he's on his way home.

## 2018-11-29 DIAGNOSIS — N184 Chronic kidney disease, stage 4 (severe): Secondary | ICD-10-CM | POA: Diagnosis not present

## 2018-11-29 DIAGNOSIS — I5042 Chronic combined systolic (congestive) and diastolic (congestive) heart failure: Secondary | ICD-10-CM | POA: Diagnosis not present

## 2018-11-29 DIAGNOSIS — H8110 Benign paroxysmal vertigo, unspecified ear: Secondary | ICD-10-CM | POA: Diagnosis not present

## 2018-11-29 DIAGNOSIS — E440 Moderate protein-calorie malnutrition: Secondary | ICD-10-CM | POA: Diagnosis not present

## 2018-11-29 DIAGNOSIS — R270 Ataxia, unspecified: Secondary | ICD-10-CM | POA: Diagnosis not present

## 2018-11-29 DIAGNOSIS — J8410 Pulmonary fibrosis, unspecified: Secondary | ICD-10-CM | POA: Diagnosis not present

## 2018-11-29 DIAGNOSIS — I130 Hypertensive heart and chronic kidney disease with heart failure and stage 1 through stage 4 chronic kidney disease, or unspecified chronic kidney disease: Secondary | ICD-10-CM | POA: Diagnosis not present

## 2018-11-29 DIAGNOSIS — D631 Anemia in chronic kidney disease: Secondary | ICD-10-CM | POA: Diagnosis not present

## 2018-11-29 DIAGNOSIS — Z79899 Other long term (current) drug therapy: Secondary | ICD-10-CM | POA: Diagnosis not present

## 2018-11-29 DIAGNOSIS — J9601 Acute respiratory failure with hypoxia: Secondary | ICD-10-CM | POA: Diagnosis not present

## 2018-11-29 DIAGNOSIS — Z9181 History of falling: Secondary | ICD-10-CM | POA: Diagnosis not present

## 2018-11-29 DIAGNOSIS — K50919 Crohn's disease, unspecified, with unspecified complications: Secondary | ICD-10-CM | POA: Diagnosis not present

## 2018-11-30 ENCOUNTER — Other Ambulatory Visit (HOSPITAL_COMMUNITY): Payer: Self-pay | Admitting: *Deleted

## 2018-11-30 ENCOUNTER — Other Ambulatory Visit (HOSPITAL_COMMUNITY): Payer: Self-pay

## 2018-11-30 MED ORDER — HYDRALAZINE HCL 10 MG PO TABS: 10.0000 mg | ORAL_TABLET | Freq: Two times a day (BID) | ORAL | 0 refills | Status: DC

## 2018-11-30 NOTE — Progress Notes (Signed)
Paramedicine Encounter    Patient ID: Brent [name], male    DOB: [birth year], 73 y.o.   MRN: [id number]    Patient Care Team: Gayland Curry, DO as PCP - General (Geriatric Medicine) Martinique, Peter M, MD as PCP - Cardiology (Cardiology) Larey Dresser, MD as PCP - Advanced Heart Failure (Cardiology) Elmarie Shiley, MD (Nephrology) Milus Banister, MD (Gastroenterology)  Patient Active Problem List   Diagnosis Date Noted  . Metabolic acidosis 22/29/7989  . Orthostasis 09/07/2018  . AKI (acute kidney injury) ([place])   . Immunosuppressed status ([place])   . Macrocytic anemia   . Chronic combined systolic and diastolic congestive heart failure ([place]) 06/03/2018  . Candida esophagitis ([place]) 09/22/2017  . History of smoking 30 or more pack years 01/05/2017  . Bipolar 1 disorder ([place])   . BPH (benign prostatic hyperplasia) 03/31/2013  . 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 Essential hypertension, benign 03/31/2013  . Acute renal failure superimposed on stage 3 chronic kidney disease ([place]) 06/04/2012  . 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[place]) 01/23/2010    Current Outpatient Medications:  .  allopurinol (ZYLOPRIM) 100 MG tablet, Take 2 tablets (200 mg total) by mouth daily., Disp: 60 tablet, Rfl: 5 .  cholecalciferol (VITAMIN D) 1000 units tablet, Take 1,000 Units by mouth daily. , Disp: , Rfl:  .  divalproex (DEPAKOTE) 500 MG EC tablet, Take 1,500 mg by mouth at bedtime. , Disp: , Rfl:  .  epoetin alfa (PROCRIT) 21194 UNIT/ML injection, Inject 10,000 Units into the skin every 30 (thirty) days. , Disp: , Rfl:  .  ferrous sulfate 325 (65 FE) MG tablet, Take 325 mg by mouth every Monday, Wednesday, and Friday. , Disp: , Rfl:  .  furosemide (LASIX) 20 MG tablet, Take 1 tablet (20 mg total) by mouth every other day., Disp: 36 tablet, Rfl: 3 .  hydrALAZINE (APRESOLINE) 10 MG tablet, Take 1 tablet (10 mg total) by mouth 2 (two) times daily., Disp: 30 tablet, Rfl: 0 .  inFLIXimab (REMICADE) 100 MG  injection, Infuse Remicade IV schedule 1 66m/kg every 8 weeks Premedicate with Tylenol 500-6564mby mouth and Benadryl 25-5051my mouth prior to infusion. Last PPD was on 12/2009. , Disp: 1 each, Rfl: 6 .  isosorbide mononitrate (IMDUR) 30 MG 24 hr tablet, Take 1 tablet (30 mg total) by mouth daily., Disp: 30 tablet, Rfl: 0 .  ivabradine (CORLANOR) 5 MG TABS tablet, Take 2.5 mg by mouth 2 (two) times daily with a meal., Disp: , Rfl:  .  loperamide (IMODIUM) 2 MG capsule, Take 4 mg by mouth daily as needed for diarrhea or loose stools., Disp: , Rfl:  .  magnesium gluconate (MAGONATE) 30 MG tablet, Take 1 tablet (30 mg total) by mouth every Monday, Wednesday, and Friday., Disp: 10 tablet, Rfl: 0 .  metoprolol succinate (TOPROL-XL) 25 MG 24 hr tablet, Take 25 mg by mouth at bedtime. , Disp: , Rfl:  .  OLANZapine (ZYPREXA) 7.5 MG tablet, Take 1 tablet (7.5 mg total) by mouth at bedtime., Disp: 30 tablet, Rfl: 11 .  omeprazole (PRILOSEC) 20 MG capsule, Take 20 mg by mouth daily before supper.  , Disp: , Rfl:  .  polyethylene glycol (MIRALAX / GLYCOLAX) packet, Take 17 g by mouth daily as needed for mild constipation., Disp: 14 each, Rfl: 0 .  tamsulosin (FLOMAX) 0.4 MG CAPS capsule, Take 0.4 mg by mouth daily., Disp: , Rfl:  Allergies  Allergen Reactions  . Azathioprine Other (See  Comments)    REACTION: affected WBC "Almost died"  . Ciprofloxacin Other (See Comments)    Unknown rxn  . Levaquin [Levofloxacin In D5w] Other (See Comments)    Unknown rxn  . Plendil [Felodipine] Other (See Comments)    Unknown rxn     Social History   Socioeconomic History  . Marital status: Divorced    Spouse name: Not on file  . Number of children: 1  . Years of education: Not on file  . Highest education level: Not on file  Occupational History  . Occupation: retired  . Occupation: [employer]  . Financial resource strain: Not hard at all  . Food insecurity    Worry: Never true    Inability:  Never true  . Transportation needs    Medical: No    Non-medical: No  Tobacco Use  . Smoking status: Former Smoker    Packs/day: 1.00    Years: 49.00    Pack years: 49.00    Types: Cigarettes    Start date: 04/11/1956    Quit date: 04/17/2014    Years since quitting: 4.6  . Smokeless tobacco: Never Used  Substance and Sexual Activity  . Alcohol use: No    Alcohol/week: 0.0 standard drinks  . Drug use: No  . Sexual activity: Never  Lifestyle  . Physical activity    Days per week: 0 days    Minutes per session: 0 min  . Stress: Not at all  Relationships  . Social connections    Talks on phone: More than three times a week    Gets together: Three times a week    Attends religious service: More than 4 times per year    Active member of club or organization: Yes    Attends meetings of clubs or organizations: 1 to 4 times per year    Relationship status: Divorced  . Intimate partner violence    Fear of current or ex partner: No    Emotionally abused: No    Physically abused: No    Forced sexual activity: No  Other Topics Concern  . Not on file  Social History Narrative  . Not on file    Physical Exam Pulmonary:     Effort: Pulmonary effort is normal. No respiratory distress.     Breath sounds: No wheezing or rales.  Musculoskeletal:        General: No swelling.     Right lower leg: No edema.     Left lower leg: No edema.  Skin:    General: Skin is warm and dry.         Future Appointments  Date Time Provider [place]  12/03/2018  3:00 PM [facility] 2 [facility]  12/08/2018 10:00 AM [facility] None  01/07/2019 10:00 AM [facility] None  01/12/2019 10:30 AM [facility] PA/NP [facility] None  01/19/2019  1:30 PM [name], Dinah C, NP PSC-[facility] None  02/14/2019  2:15 PM Reed, Tiffany L, DO PSC[facility] None     BP 108/70 (BP Location: Left Arm, Patient Position: Sitting, Cuff Size: Normal)   Pulse 84   Wt 168 lb 9.6 oz (76.5 kg)   SpO2 98%   BMI  20.52 kg/m   Weight yesterday-168 Last visit weight-166  ATF pt CAO x4 sitting in the living room with no complaints.  He's taken his medications today and didn't miss any this week.  The RN refilled his pill box yesterday.  [name] has  a drawer full of expired medications and medications that his no longer taking.  He agreed to allow me to take them to the police department for disposal.  He denies sob, chest pain and dizziness.  He hasn't added additional pillows to sleep at night.  rx bottles verified.   Medication ordered: none  Dyani Babel, EMT Paramedic [phone] 12/01/2018    ACTION: Home visit completed

## 2018-12-01 ENCOUNTER — Ambulatory Visit (HOSPITAL_COMMUNITY)
Admission: RE | Admit: 2018-12-01 | Discharge: 2018-12-01 | Disposition: A | Discharge status: Home / Self Care | Payer: Medicare Other | Source: Ambulatory Visit | Attending: Internal Medicine | Admitting: Internal Medicine

## 2018-12-01 ENCOUNTER — Other Ambulatory Visit: Payer: Self-pay

## 2018-12-01 ENCOUNTER — Encounter (HOSPITAL_COMMUNITY): Payer: Self-pay

## 2018-12-01 VITALS — BP 118/68 | HR 88 | Wt 169.4 lb

## 2018-12-01 DIAGNOSIS — M109 Gout, unspecified: Secondary | ICD-10-CM | POA: Insufficient documentation

## 2018-12-01 DIAGNOSIS — Z87891 Personal history of nicotine dependence: Secondary | ICD-10-CM | POA: Diagnosis not present

## 2018-12-01 DIAGNOSIS — F319 Bipolar disorder, unspecified: Secondary | ICD-10-CM | POA: Insufficient documentation

## 2018-12-01 DIAGNOSIS — J449 Chronic obstructive pulmonary disease, unspecified: Secondary | ICD-10-CM | POA: Insufficient documentation

## 2018-12-01 DIAGNOSIS — I5022 Chronic systolic (congestive) heart failure: Secondary | ICD-10-CM | POA: Insufficient documentation

## 2018-12-01 DIAGNOSIS — Z79899 Other long term (current) drug therapy: Secondary | ICD-10-CM | POA: Insufficient documentation

## 2018-12-01 DIAGNOSIS — N183 Chronic kidney disease, stage 3 unspecified: Secondary | ICD-10-CM | POA: Diagnosis not present

## 2018-12-01 DIAGNOSIS — K5090 Crohn's disease, unspecified, without complications: Secondary | ICD-10-CM | POA: Insufficient documentation

## 2018-12-01 DIAGNOSIS — I5042 Chronic combined systolic (congestive) and diastolic (congestive) heart failure: Secondary | ICD-10-CM

## 2018-12-01 DIAGNOSIS — I130 Hypertensive heart and chronic kidney disease with heart failure and stage 1 through stage 4 chronic kidney disease, or unspecified chronic kidney disease: Secondary | ICD-10-CM | POA: Diagnosis not present

## 2018-12-01 DIAGNOSIS — K900 Celiac disease: Secondary | ICD-10-CM | POA: Insufficient documentation

## 2018-12-01 NOTE — Progress Notes (Signed)
PCP: Gayland Curry, DO Cardiology: Dr. Leandro Reasoner Brent [name] is a 73 y.o. with a history of Crohn's disease, celiac disease,HTN, COPD,priortobacco use, CKD 3, chronic anemia, and bipolar disorder.Diagnosed with systolic HF in 9/47 (echo with EF 30-35%).   Admitted 3/11-3/16/20 with SOB and hypoxia. Found to have PNA and required BIPAP and antibiotics. Blood cultures were negative. Echo showed newly reduced EF 30-35%. Cardiology consulted and he had nuclear stress test, which was read as "high risk" due to low EF. Per Dr Sallyanne Kuster, did not think changes were indicative of coronary disease and cath was not pursued due this and due to CKD. HF meds optimized. Limited with CKD3. He had one short run of NSVT. Also had AKI on CKD3, but creatinine improved to baseline 1.77 on day of discharge.  He was seen in ED 05/12/18 with SOB after misplacing his nebulizer. BNP was elevated, so he was given 40 mg IV lasix and discharged with lasix 20 mg BID. Referred to HF clinic.  He was admitted again in 7/20 with weakness, orthostatic hypotension, and AKI.  Cardiac meds were held.  He was noted to be severely deconditioned, and he was discharged to SNF.  He remained in SNF x 2 wks and is now home.   Echo was done in 8/20, EF remains 20-25% with mild LVH and normal RV.  He had RHC in 8/20 showing normal filling pressures and good cardiac output (surprisingly).   Today he returns for HF follow up. He started corlanor about 1 week ago and has felt better.  Overall feeling fine. Denies SOB/PND/Orthopnea. He is able to walk around the grocery store. Appetite ok. No fever or chills. Weight at home 169 pounds. Taking all medications. He is followed by HF Paramedicine.  .  Labs (7/20): K 5.1, creatinine 2.25 Labs (8/20): K 4.8, creatinine 2.65 Labs (9/20): hgb 9.7  Labs 11/10/18: K 4.1 Creatinine 2.14   ECG (8/20, personally reviewed): ST at 115, LVH with repolarization abnormality, narrow QRS   PMH: 1. Crohns  disease: Gets Remicade.  2. Celiac disease.  3. COPD: Prior smoker.  - CT chest (7/20) with mild emphysema.  4. CKD: Stage 3.  5. HTN 6. Bipolar disorder 7. H/o BPPV 8. Gout 9. Chronic systolic CHF: Etiology uncertain.  - Echo (3/20): EF 30-35%, mild LVH, normal RV.  - Cardiolite (3/20): EF 28%, no ischemia, fixed defects in anteroseptal and inferolateral walls.  - Echo (8/20): EF 20-25%, diffuse hypokinesis, mild LVH, normal RV size and systolic function, normal IVC size.  - RHC (8/20): mean RA 3, PA 21/10, mean PCWP 5, CI 3.2 Fick and 3.5 Thermo  Social History   Socioeconomic History  . Marital status: Divorced    Spouse name: Not on file  . Number of children: 1  . Years of education: Not on file  . Highest education level: Not on file  Occupational History  . Occupation: retired  . Occupation: [employer]  . Financial resource strain: Not hard at all  . Food insecurity    Worry: Never true    Inability: Never true  . Transportation needs    Medical: No    Non-medical: No  Tobacco Use  . Smoking status: Former Smoker    Packs/day: 1.00    Years: 49.00    Pack years: 49.00    Types: Cigarettes    Start date: 04/11/1956    Quit date: 04/17/2014    Years since quitting: 4.6  .  Smokeless tobacco: Never Used  Substance and Sexual Activity  . Alcohol use: No    Alcohol/week: 0.0 standard drinks  . Drug use: No  . Sexual activity: Never  Lifestyle  . Physical activity    Days per week: 0 days    Minutes per session: 0 min  . Stress: Not at all  Relationships  . Social connections    Talks on phone: More than three times a week    Gets together: Three times a week    Attends religious service: More than 4 times per year    Active member of club or organization: Yes    Attends meetings of clubs or organizations: 1 to 4 times per year    Relationship status: Divorced  . Intimate partner violence    Fear of current or ex partner: No    Emotionally  abused: No    Physically abused: No    Forced sexual activity: No  Other Topics Concern  . Not on file  Social History Narrative  . Not on file   Family History  Problem Relation Age of Onset  . Diabetes Mother        maternal grandmother  . Uterine cancer Mother   . Emphysema Father   . Pneumonia Maternal Grandmother   . Colon cancer Neg Hx    ROS: All systems reviewed and negative except as per HPI.   Current Outpatient Medications  Medication Sig Dispense Refill  . allopurinol (ZYLOPRIM) 100 MG tablet Take 2 tablets (200 mg total) by mouth daily. 60 tablet 5  . cholecalciferol (VITAMIN D) 1000 units tablet Take 1,000 Units by mouth daily.     . divalproex (DEPAKOTE) 500 MG EC tablet Take 1,500 mg by mouth at bedtime.     Marland Kitchen epoetin alfa (PROCRIT) 60109 UNIT/ML injection Inject 10,000 Units into the skin every 30 (thirty) days.     . ferrous sulfate 325 (65 FE) MG tablet Take 325 mg by mouth every Monday, Wednesday, and Friday.     . furosemide (LASIX) 20 MG tablet Take 1 tablet (20 mg total) by mouth every other day. 36 tablet 3  . hydrALAZINE (APRESOLINE) 10 MG tablet Take 1 tablet (10 mg total) by mouth 2 (two) times daily. 30 tablet 0  . inFLIXimab (REMICADE) 100 MG injection Infuse Remicade IV schedule 1 89m/kg every 8 weeks Premedicate with Tylenol 500-6553mby mouth and Benadryl 25-5044my mouth prior to infusion. Last PPD was on 12/2009.  1 each 6  . isosorbide mononitrate (IMDUR) 30 MG 24 hr tablet Take 1 tablet (30 mg total) by mouth daily. 30 tablet 0  . ivabradine (CORLANOR) 5 MG TABS tablet Take 2.5 mg by mouth 2 (two) times daily with a meal.    . loperamide (IMODIUM) 2 MG capsule Take 4 mg by mouth daily as needed for diarrhea or loose stools.    . magnesium gluconate (MAGONATE) 30 MG tablet Take 1 tablet (30 mg total) by mouth every Monday, Wednesday, and Friday. 10 tablet 0  . metoprolol succinate (TOPROL-XL) 25 MG 24 hr tablet Take 25 mg by mouth at bedtime.      . OMarland KitchenANZapine (ZYPREXA) 7.5 MG tablet Take 1 tablet (7.5 mg total) by mouth at bedtime. 30 tablet 11  . omeprazole (PRILOSEC) 20 MG capsule Take 20 mg by mouth daily before supper.      . polyethylene glycol (MIRALAX / GLYCOLAX) packet Take 17 g by mouth daily as needed for mild constipation. 14Mabel  each 0  . tamsulosin (FLOMAX) 0.4 MG CAPS capsule Take 0.4 mg by mouth daily.     No current facility-administered medications for this encounter.    BP 118/68   Pulse 88   Wt 76.8 kg (169 lb 6.4 oz)   SpO2 98%   BMI 20.62 kg/m  Wt Readings from Last 3 Encounters:  12/01/18 76.8 kg (169 lb 6.4 oz)  11/30/18 76.5 kg (168 lb 9.6 oz)  11/19/18 75.5 kg (166 lb 8 oz)   General:  Well appearing. No resp difficulty HEENT: normal Neck: supple. no JVD. Carotids 2+ bilat; no bruits. No lymphadenopathy or thryomegaly appreciated. Cor: PMI nondisplaced. Regular rate & rhythm. No rubs, gallops or murmurs. Lungs: clear Abdomen: soft, nontender, nondistended. No hepatosplenomegaly. No bruits or masses. Good bowel sounds. Extremities: no cyanosis, clubbing, rash, edema Neuro: alert & orientedx3, cranial nerves grossly intact. moves all 4 extremities w/o difficulty. Affect pleasant   Assessment/Plan: 1. Chronic systolic CHF: Diagnosed by echo in 3/20, EF 30-35%.  Cardiolite showed fixed defects but no ischemia.  Etiology of cardiomyopathy is uncertain, he has not had cath or MRI due to elevated creatinine. He has no chest pain.  Medication titration has been limited by orthostasis/low BP.  Echo in 8/20 showed EF 20-25% with mild LVH and normal RV function.  RHC in 8/20 showed normal cardiac output and filling pressures.  NYHA II. Volume status stable. Continue lasix 20 mg every other day.  - Continue current hydralazine/Imdur, no BP room to titrate.  - Continue Toprol XL 25 mg daily, take in evening before bed.  - No ACEI/ARB/ARNI with low BP and elevated creatinine.  - If creatinine trends down < 2, would  plan for coronary angiography. - Continue corlanor 2.5 mg twice a day. Receiving patient assistance for corlanor.  - He would be a difficult candidate for advanced therapies with deconditioning, renal dysfunction, and lives alone.   2. CKD: Stage 3  Check BMET next visit    3. COPD: Mild emphysema on prior chest CT.   - PFTs .     Follow up in 6 weeks with Dr [name].  Amy Clegg NP-C  12/01/2018

## 2018-12-01 NOTE — Patient Instructions (Addendum)
Please bring all medications to your next appointment.   Please follow up with the [facility] in 6 weeks.  At the [facility], you and your health needs are our priority. As part of our continuing mission to provide you with exceptional heart care, we have created designated Provider Care Teams. These Care Teams include your primary Cardiologist (physician) and Advanced Practice Providers (APPs- Physician Assistants and Nurse Practitioners) who all work together to provide you with the care you need, when you need it.   You may see any of the following providers on your designated Care Team at your next follow up: Marland Kitchen Dr Glori Bickers . Dr Loralie Champagne . Darrick Grinder, NP   Please be sure to bring in all your medications bottles to every appointment.

## 2018-12-02 DIAGNOSIS — I5042 Chronic combined systolic (congestive) and diastolic (congestive) heart failure: Secondary | ICD-10-CM | POA: Diagnosis not present

## 2018-12-02 DIAGNOSIS — E440 Moderate protein-calorie malnutrition: Secondary | ICD-10-CM | POA: Diagnosis not present

## 2018-12-02 DIAGNOSIS — R270 Ataxia, unspecified: Secondary | ICD-10-CM | POA: Diagnosis not present

## 2018-12-02 DIAGNOSIS — N184 Chronic kidney disease, stage 4 (severe): Secondary | ICD-10-CM | POA: Diagnosis not present

## 2018-12-02 DIAGNOSIS — J9601 Acute respiratory failure with hypoxia: Secondary | ICD-10-CM | POA: Diagnosis not present

## 2018-12-02 DIAGNOSIS — Z79899 Other long term (current) drug therapy: Secondary | ICD-10-CM | POA: Diagnosis not present

## 2018-12-02 DIAGNOSIS — Z9181 History of falling: Secondary | ICD-10-CM | POA: Diagnosis not present

## 2018-12-02 DIAGNOSIS — I130 Hypertensive heart and chronic kidney disease with heart failure and stage 1 through stage 4 chronic kidney disease, or unspecified chronic kidney disease: Secondary | ICD-10-CM | POA: Diagnosis not present

## 2018-12-02 DIAGNOSIS — H8110 Benign paroxysmal vertigo, unspecified ear: Secondary | ICD-10-CM | POA: Diagnosis not present

## 2018-12-02 DIAGNOSIS — K50919 Crohn's disease, unspecified, with unspecified complications: Secondary | ICD-10-CM | POA: Diagnosis not present

## 2018-12-02 DIAGNOSIS — D631 Anemia in chronic kidney disease: Secondary | ICD-10-CM | POA: Diagnosis not present

## 2018-12-02 DIAGNOSIS — J8410 Pulmonary fibrosis, unspecified: Secondary | ICD-10-CM | POA: Diagnosis not present

## 2018-12-03 ENCOUNTER — Other Ambulatory Visit: Payer: Self-pay

## 2018-12-03 ENCOUNTER — Ambulatory Visit (HOSPITAL_COMMUNITY)
Admission: RE | Admit: 2018-12-03 | Discharge: 2018-12-03 | Disposition: A | Discharge status: Home / Self Care | Payer: Medicare Other | Source: Ambulatory Visit | Attending: Cardiology | Admitting: Cardiology

## 2018-12-03 DIAGNOSIS — J849 Interstitial pulmonary disease, unspecified: Secondary | ICD-10-CM | POA: Diagnosis not present

## 2018-12-03 DIAGNOSIS — J929 Pleural plaque without asbestos: Secondary | ICD-10-CM | POA: Diagnosis not present

## 2018-12-03 DIAGNOSIS — R918 Other nonspecific abnormal finding of lung field: Secondary | ICD-10-CM | POA: Diagnosis not present

## 2018-12-06 ENCOUNTER — Telehealth (HOSPITAL_COMMUNITY): Payer: Self-pay

## 2018-12-06 DIAGNOSIS — Z79899 Other long term (current) drug therapy: Secondary | ICD-10-CM | POA: Diagnosis not present

## 2018-12-06 DIAGNOSIS — J8410 Pulmonary fibrosis, unspecified: Secondary | ICD-10-CM | POA: Diagnosis not present

## 2018-12-06 DIAGNOSIS — I130 Hypertensive heart and chronic kidney disease with heart failure and stage 1 through stage 4 chronic kidney disease, or unspecified chronic kidney disease: Secondary | ICD-10-CM | POA: Diagnosis not present

## 2018-12-06 DIAGNOSIS — D631 Anemia in chronic kidney disease: Secondary | ICD-10-CM | POA: Diagnosis not present

## 2018-12-06 DIAGNOSIS — H8110 Benign paroxysmal vertigo, unspecified ear: Secondary | ICD-10-CM | POA: Diagnosis not present

## 2018-12-06 DIAGNOSIS — J9601 Acute respiratory failure with hypoxia: Secondary | ICD-10-CM | POA: Diagnosis not present

## 2018-12-06 DIAGNOSIS — R270 Ataxia, unspecified: Secondary | ICD-10-CM | POA: Diagnosis not present

## 2018-12-06 DIAGNOSIS — E440 Moderate protein-calorie malnutrition: Secondary | ICD-10-CM | POA: Diagnosis not present

## 2018-12-06 DIAGNOSIS — N184 Chronic kidney disease, stage 4 (severe): Secondary | ICD-10-CM | POA: Diagnosis not present

## 2018-12-06 DIAGNOSIS — K50919 Crohn's disease, unspecified, with unspecified complications: Secondary | ICD-10-CM | POA: Diagnosis not present

## 2018-12-06 DIAGNOSIS — I5042 Chronic combined systolic (congestive) and diastolic (congestive) heart failure: Secondary | ICD-10-CM | POA: Diagnosis not present

## 2018-12-06 DIAGNOSIS — Z9181 History of falling: Secondary | ICD-10-CM | POA: Diagnosis not present

## 2018-12-06 NOTE — Telephone Encounter (Signed)
-----   Message from Larey Dresser, MD sent at 12/05/2018  6:40 PM EDT ----- Chronic bronchitis, no ILD.

## 2018-12-06 NOTE — Telephone Encounter (Signed)
Pt aware of results of CT scan and appreciative

## 2018-12-08 ENCOUNTER — Ambulatory Visit (HOSPITAL_COMMUNITY)
Admission: RE | Admit: 2018-12-08 | Discharge: 2018-12-08 | Disposition: A | Discharge status: Home / Self Care | Payer: Medicare Other | Source: Ambulatory Visit | Attending: Nephrology | Admitting: Nephrology

## 2018-12-08 ENCOUNTER — Other Ambulatory Visit: Payer: Self-pay

## 2018-12-08 DIAGNOSIS — I130 Hypertensive heart and chronic kidney disease with heart failure and stage 1 through stage 4 chronic kidney disease, or unspecified chronic kidney disease: Secondary | ICD-10-CM | POA: Diagnosis not present

## 2018-12-08 DIAGNOSIS — R270 Ataxia, unspecified: Secondary | ICD-10-CM | POA: Diagnosis not present

## 2018-12-08 DIAGNOSIS — J9601 Acute respiratory failure with hypoxia: Secondary | ICD-10-CM | POA: Diagnosis not present

## 2018-12-08 DIAGNOSIS — I5042 Chronic combined systolic (congestive) and diastolic (congestive) heart failure: Secondary | ICD-10-CM | POA: Diagnosis not present

## 2018-12-08 DIAGNOSIS — D631 Anemia in chronic kidney disease: Secondary | ICD-10-CM | POA: Diagnosis not present

## 2018-12-08 DIAGNOSIS — N183 Chronic kidney disease, stage 3 unspecified: Secondary | ICD-10-CM | POA: Diagnosis not present

## 2018-12-08 DIAGNOSIS — E440 Moderate protein-calorie malnutrition: Secondary | ICD-10-CM | POA: Diagnosis not present

## 2018-12-08 DIAGNOSIS — K50919 Crohn's disease, unspecified, with unspecified complications: Secondary | ICD-10-CM | POA: Diagnosis not present

## 2018-12-08 DIAGNOSIS — N184 Chronic kidney disease, stage 4 (severe): Secondary | ICD-10-CM | POA: Diagnosis not present

## 2018-12-08 DIAGNOSIS — H8110 Benign paroxysmal vertigo, unspecified ear: Secondary | ICD-10-CM | POA: Diagnosis not present

## 2018-12-08 DIAGNOSIS — Z79899 Other long term (current) drug therapy: Secondary | ICD-10-CM | POA: Diagnosis not present

## 2018-12-08 DIAGNOSIS — Z9181 History of falling: Secondary | ICD-10-CM | POA: Diagnosis not present

## 2018-12-08 DIAGNOSIS — J8410 Pulmonary fibrosis, unspecified: Secondary | ICD-10-CM | POA: Diagnosis not present

## 2018-12-08 LAB — HEMOGLOBIN AND HEMATOCRIT, BLOOD
HCT: 34.4 % — ABNORMAL LOW (ref 39.0–52.0)
Hemoglobin: 10.5 g/dL — ABNORMAL LOW (ref 13.0–17.0)

## 2018-12-08 LAB — IRON AND TIBC
Iron: 83 ug/dL (ref 45–182)
Saturation Ratios: 35 % (ref 17.9–39.5)
TIBC: 238 ug/dL — ABNORMAL LOW (ref 250–450)
UIBC: 155 ug/dL

## 2018-12-08 LAB — FERRITIN: Ferritin: 610 ng/mL — ABNORMAL HIGH (ref 24–336)

## 2018-12-08 MED ORDER — EPOETIN ALFA 10000 UNIT/ML IJ SOLN
10000.0000 [IU] | INTRAMUSCULAR | Status: DC
  Administered 2018-12-08: 10000 [IU] via SUBCUTANEOUS
  Filled 2018-12-08: qty 1

## 2018-12-08 NOTE — Progress Notes (Signed)
PATIENT CARE CENTER NOTE  Diagnosis:Anemia associated with Chronic Renal Failure, anemia associated with renal disease  Provider:Patel, Ulice Dash MD   Procedure:Epoetin Alfa (Procrit) injection   Note:Patient received sub-qProcritinjection in right arm. Tolerated well. Labs drawn pre-injection and Hemoglobin was 10.5. Patient BP was110/63.Discharge instructions given. Patient alert, oriented and ambulatoryat discharge.

## 2018-12-08 NOTE — Discharge Instructions (Signed)

## 2018-12-10 ENCOUNTER — Telehealth: Payer: Self-pay

## 2018-12-10 ENCOUNTER — Other Ambulatory Visit (HOSPITAL_COMMUNITY): Payer: Self-pay

## 2018-12-10 NOTE — Telephone Encounter (Signed)
[place].  He was actually receiving his psychiatric care at the [place], but I'm happy to see him Monday and see what we can figure out.

## 2018-12-10 NOTE — Telephone Encounter (Signed)
A nurse with heart and vascular clinic visited patient today for a home visit and called to give Dr. Mariea Clonts an update. Patient expressed increased depression and no motivation in the mornings. Patient is getting back into an isolated state.  Nurse requested a follow-up with Dr.Reed.  Patient scheduled for Monday 12/13/2018  Message forwarded to [employer], DO

## 2018-12-10 NOTE — Progress Notes (Signed)
Paramedicine Encounter    Patient ID: Brent [name], male    DOB: 30-Apr-1945, 73 y.o.   MRN: [id number]    Patient Care Team: Gayland Curry, DO as PCP - General (Geriatric Medicine) Martinique, Peter M, MD as PCP - Cardiology (Cardiology) Larey Dresser, MD as PCP - Advanced Heart Failure (Cardiology) Elmarie Shiley, MD (Nephrology) Milus Banister, MD (Gastroenterology)  Patient Active Problem List   Diagnosis Date Noted  . Metabolic acidosis 60/11/9321  . Orthostasis 09/07/2018  . AKI (acute kidney injury) ([place])   . Immunosuppressed status ([place])   . Macrocytic anemia   . Chronic combined systolic and diastolic congestive heart failure ([place]) 06/03/2018  . Candida esophagitis ([place]) 09/22/2017  . History of smoking 30 or more pack years 01/05/2017  . Bipolar 1 disorder ([place])   . BPH (benign prostatic hyperplasia) 03/31/2013  . 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 Essential hypertension, benign 03/31/2013  . Acute renal failure superimposed on stage 3 chronic kidney disease ([place]) 06/04/2012  . 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[place]) 01/23/2010    Current Outpatient Medications:  .  allopurinol (ZYLOPRIM) 100 MG tablet, Take 2 tablets (200 mg total) by mouth daily., Disp: 60 tablet, Rfl: 5 .  cholecalciferol (VITAMIN D) 1000 units tablet, Take 1,000 Units by mouth daily. , Disp: , Rfl:  .  divalproex (DEPAKOTE) 500 MG EC tablet, Take 1,500 mg by mouth at bedtime. , Disp: , Rfl:  .  epoetin alfa (PROCRIT) 55732 UNIT/ML injection, Inject 10,000 Units into the skin every 30 (thirty) days. , Disp: , Rfl:  .  ferrous sulfate 325 (65 FE) MG tablet, Take 325 mg by mouth every Monday, Wednesday, and Friday. , Disp: , Rfl:  .  furosemide (LASIX) 20 MG tablet, Take 1 tablet (20 mg total) by mouth every other day., Disp: 36 tablet, Rfl: 3 .  hydrALAZINE (APRESOLINE) 10 MG tablet, Take 1 tablet (10 mg total) by mouth 2 (two) times daily., Disp: 30 tablet, Rfl: 0 .  inFLIXimab (REMICADE) 100 MG  injection, Infuse Remicade IV schedule 1 52m/kg every 8 weeks Premedicate with Tylenol 500-658mby mouth and Benadryl 25-5047my mouth prior to infusion. Last PPD was on 12/2009. , Disp: 1 each, Rfl: 6 .  isosorbide mononitrate (IMDUR) 30 MG 24 hr tablet, Take 1 tablet (30 mg total) by mouth daily., Disp: 30 tablet, Rfl: 0 .  ivabradine (CORLANOR) 5 MG TABS tablet, Take 2.5 mg by mouth 2 (two) times daily with a meal., Disp: , Rfl:  .  loperamide (IMODIUM) 2 MG capsule, Take 4 mg by mouth daily as needed for diarrhea or loose stools., Disp: , Rfl:  .  magnesium gluconate (MAGONATE) 30 MG tablet, Take 1 tablet (30 mg total) by mouth every Monday, Wednesday, and Friday., Disp: 10 tablet, Rfl: 0 .  metoprolol succinate (TOPROL-XL) 25 MG 24 hr tablet, Take 25 mg by mouth at bedtime. , Disp: , Rfl:  .  OLANZapine (ZYPREXA) 7.5 MG tablet, Take 1 tablet (7.5 mg total) by mouth at bedtime., Disp: 30 tablet, Rfl: 11 .  omeprazole (PRILOSEC) 20 MG capsule, Take 20 mg by mouth daily before supper.  , Disp: , Rfl:  .  polyethylene glycol (MIRALAX / GLYCOLAX) packet, Take 17 g by mouth daily as needed for mild constipation., Disp: 14 each, Rfl: 0 .  tamsulosin (FLOMAX) 0.4 MG CAPS capsule, Take 0.4 mg by mouth daily., Disp: , Rfl:  Allergies  Allergen Reactions  . Azathioprine Other (See  Comments)    REACTION: affected WBC "Almost died"  . Ciprofloxacin Other (See Comments)    Unknown rxn  . Levaquin [Levofloxacin In D5w] Other (See Comments)    Unknown rxn  . Plendil [Felodipine] Other (See Comments)    Unknown rxn     Social History   Socioeconomic History  . Marital status: Divorced    Spouse name: Not on file  . Number of children: 1  . Years of education: Not on file  . Highest education level: Not on file  Occupational History  . Occupation: retired  . Occupation: [employer]  . Financial resource strain: Not hard at all  . Food insecurity    Worry: Never true    Inability:  Never true  . Transportation needs    Medical: No    Non-medical: No  Tobacco Use  . Smoking status: Former Smoker    Packs/day: 1.00    Years: 49.00    Pack years: 49.00    Types: Cigarettes    Start date: 04/11/1956    Quit date: 04/17/2014    Years since quitting: 4.6  . Smokeless tobacco: Never Used  Substance and Sexual Activity  . Alcohol use: No    Alcohol/week: 0.0 standard drinks  . Drug use: No  . Sexual activity: Never  Lifestyle  . Physical activity    Days per week: 0 days    Minutes per session: 0 min  . Stress: Not at all  Relationships  . Social connections    Talks on phone: More than three times a week    Gets together: Three times a week    Attends religious service: More than 4 times per year    Active member of club or organization: Yes    Attends meetings of clubs or organizations: 1 to 4 times per year    Relationship status: Divorced  . Intimate partner violence    Fear of current or ex partner: No    Emotionally abused: No    Physically abused: No    Forced sexual activity: No  Other Topics Concern  . Not on file  Social History Narrative  . Not on file    Physical Exam Pulmonary:     Effort: Pulmonary effort is normal. No respiratory distress.     Breath sounds: No wheezing.  Abdominal:     General: Abdomen is flat.  Musculoskeletal:        General: No swelling.     Right lower leg: No edema.     Left lower leg: No edema.  Skin:    General: Skin is warm and dry.         Future Appointments  Date Time Provider [place]  12/13/2018  2:15 PM Hollace Kinnier L, DO PSC-[facility] None  01/07/2019 10:00 AM [facility] None  01/12/2019 10:30 AM [facility] PA/NP [facility] None  01/19/2019  1:30 PM [name], [name], NP [facility] None  02/14/2019  2:15 PM Reed, Tiffany L, DO [facility] None     BP 116/78 (BP Location: Right Arm, Patient Position: Sitting, Cuff Size: Normal)   Pulse 81   Temp 97.8 F (36.6 C)   Wt 170 lb (77.1 kg)    BMI 20.69 kg/m   Weight yesterday-168 Last visit weight-168  ATF pt CAO x4 sitting in the living room watching tv.  He said that it's been difficult for him to get up in the morning, because he's not motivated. He said that  he feel like he's becoming depressed again.  He said that "this" happens from time to time, where he stays in the house and become isolated from his friends and family.  [name] has hx of PTSD and bi-polar, which he takes medication for nightly.  The RN refilled his pill box Tuesday.   I called Dr. Joneen Caraway, pt's local pcp @   [facility] and notified her staff of his current condition and complaints.  The office assistant set him a office visit for Monday @  230.  He put the appointment in his calendar as a reminder.    Medication ordered: none Bayley [place]ar[place], EMT Paramedic [phone] 12/10/2018    ACTION: Home visit completed

## 2018-12-13 ENCOUNTER — Ambulatory Visit (INDEPENDENT_AMBULATORY_CARE_PROVIDER_SITE_OTHER): Payer: Medicare Other | Admitting: Internal Medicine

## 2018-12-13 ENCOUNTER — Encounter: Payer: Self-pay | Admitting: Internal Medicine

## 2018-12-13 ENCOUNTER — Other Ambulatory Visit: Payer: Self-pay

## 2018-12-13 ENCOUNTER — Other Ambulatory Visit (HOSPITAL_COMMUNITY): Payer: Self-pay | Admitting: Cardiology

## 2018-12-13 VITALS — BP 110/70 | HR 88 | Temp 97.5°F | Ht 76.0 in | Wt 172.6 lb

## 2018-12-13 DIAGNOSIS — Z79899 Other long term (current) drug therapy: Secondary | ICD-10-CM | POA: Diagnosis not present

## 2018-12-13 DIAGNOSIS — I5042 Chronic combined systolic (congestive) and diastolic (congestive) heart failure: Secondary | ICD-10-CM

## 2018-12-13 DIAGNOSIS — F319 Bipolar disorder, unspecified: Secondary | ICD-10-CM | POA: Diagnosis not present

## 2018-12-13 DIAGNOSIS — Z9181 History of falling: Secondary | ICD-10-CM | POA: Diagnosis not present

## 2018-12-13 DIAGNOSIS — K50919 Crohn's disease, unspecified, with unspecified complications: Secondary | ICD-10-CM | POA: Diagnosis not present

## 2018-12-13 DIAGNOSIS — N184 Chronic kidney disease, stage 4 (severe): Secondary | ICD-10-CM | POA: Diagnosis not present

## 2018-12-13 DIAGNOSIS — D631 Anemia in chronic kidney disease: Secondary | ICD-10-CM | POA: Diagnosis not present

## 2018-12-13 DIAGNOSIS — J8410 Pulmonary fibrosis, unspecified: Secondary | ICD-10-CM | POA: Diagnosis not present

## 2018-12-13 DIAGNOSIS — J9601 Acute respiratory failure with hypoxia: Secondary | ICD-10-CM | POA: Diagnosis not present

## 2018-12-13 DIAGNOSIS — F339 Major depressive disorder, recurrent, unspecified: Secondary | ICD-10-CM

## 2018-12-13 DIAGNOSIS — R270 Ataxia, unspecified: Secondary | ICD-10-CM | POA: Diagnosis not present

## 2018-12-13 DIAGNOSIS — H8110 Benign paroxysmal vertigo, unspecified ear: Secondary | ICD-10-CM | POA: Diagnosis not present

## 2018-12-13 DIAGNOSIS — E440 Moderate protein-calorie malnutrition: Secondary | ICD-10-CM | POA: Diagnosis not present

## 2018-12-13 DIAGNOSIS — I130 Hypertensive heart and chronic kidney disease with heart failure and stage 1 through stage 4 chronic kidney disease, or unspecified chronic kidney disease: Secondary | ICD-10-CM | POA: Diagnosis not present

## 2018-12-13 NOTE — Progress Notes (Signed)
Location:  [facility]  Provider: Dr. Hollace Kinnier  Advanced Directives 12/13/2018  Does Patient Have a Medical Advance Directive? Yes  Type of Advance Directive -  Does patient want to make changes to medical advance directive? -  Copy of [place] in Chart? -  Would patient like information on creating a medical advance directive? -  Pre-existing out of facility DNR order (yellow form or pink MOST form) -     Chief Complaint  Patient presents with  . Acute Visit    Depression     HPI: Patient is a 73 y.o. male seen today for medical management of chronic diseases.    Having spells of depression every morning. It has occurred for the last three weeks. Heart failure nurses make regular house visits and noticed he was feeling sad. [facility] a [facility]. Worried about seeing the [place]   He thinks isolation is the cause   Talks with sister daily. Talks with son only when he wants money.   He use to leave his apartment   Mother passed 2 years a go on October 6th, he does not know if that has something to  do with it.   Past Medical History:  Diagnosis Date  . Anemia   . Anxiety   . Benign paroxysmal positional vertigo   . Bipolar I disorder, most recent episode (or current) unspecified   . Celiac disease   . Chronic kidney disease, stage III (moderate)   . Crohn's    Remicade q8 weeks  . Depression   . Essential and other specified forms of tremor    medication-induced Parkinson's, now resolved  . Essential hypertension, benign   . Gout 2018  . Heart failure ([place])   . Hypertrophy of prostate without urinary obstruction and other lower urinary tract symptoms (LUTS)   . Impotence of organic origin   . Insomnia with sleep apnea, unspecified   . Iron deficiency anemia, unspecified   . 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 Neuralgia, neuritis, and radiculitis, unspecified   . Other B-complex deficiencies   . Other extrapyramidal disease and  abnormal movement disorder   . Postinflammatory pulmonary fibrosis ([place])   . Tobacco use disorder   . Vertigo 2018    Past Surgical History:  Procedure Laterality Date  . CATARACT EXTRACTION, BILATERAL Bilateral 09/2018  . CHOLECYSTECTOMY  07-12-2010  . RIGHT HEART CATH N/A 10/12/2018   Procedure: RIGHT HEART CATH;  Surgeon: Jolaine Artist, MD;  Location: [place] [facility];  Service: Cardiovascular;  Laterality: N/A;  . SMALL INTESTINE SURGERY     x 2    Allergies  Allergen Reactions  . Azathioprine Other (See Comments)    REACTION: affected WBC "Almost died"  . Ciprofloxacin Other (See Comments)    Unknown rxn  . Levaquin [Levofloxacin In D5w] Other (See Comments)    Unknown rxn  . Plendil [Felodipine] Other (See Comments)    Unknown rxn    Outpatient Encounter Medications as of 12/13/2018  Medication Sig  . allopurinol (ZYLOPRIM) 100 MG tablet Take 2 tablets (200 mg total) by mouth daily.  . cholecalciferol (VITAMIN D) 1000 units tablet Take 1,000 Units by mouth daily.   . divalproex (DEPAKOTE) 500 MG EC tablet Take 1,500 mg by mouth at bedtime.   Marland Kitchen epoetin alfa (PROCRIT) 33295 UNIT/ML injection Inject 10,000 Units into the skin every 30 (thirty) days.   . ferrous sulfate 325 (65 FE) MG tablet Take  325 mg by mouth every Monday, Wednesday, and Friday.   . furosemide (LASIX) 20 MG tablet Take 1 tablet (20 mg total) by mouth every other day.  . hydrALAZINE (APRESOLINE) 10 MG tablet Take 1 tablet (10 mg total) by mouth 2 (two) times daily.  Marland Kitchen inFLIXimab (REMICADE) 100 MG injection Infuse Remicade IV schedule 1 66m/kg every 8 weeks Premedicate with Tylenol 500-6514mby mouth and Benadryl 25-5069my mouth prior to infusion. Last PPD was on 12/2009.   . isosorbide mononitrate (IMDUR) 30 MG 24 hr tablet Take 1 tablet (30 mg total) by mouth daily.  . ivabradine (CORLANOR) 5 MG TABS tablet Take 2.5 mg by mouth 2 (two) times daily with a meal.  . loperamide (IMODIUM) 2 MG  capsule Take 4 mg by mouth daily as needed for diarrhea or loose stools.  . magnesium gluconate (MAGONATE) 30 MG tablet Take 1 tablet (30 mg total) by mouth every Monday, Wednesday, and Friday.  . metoprolol succinate (TOPROL-XL) 25 MG 24 hr tablet Take 25 mg by mouth at bedtime.   . OMarland KitchenANZapine (ZYPREXA) 7.5 MG tablet Take 1 tablet (7.5 mg total) by mouth at bedtime.  . oMarland Kitcheneprazole (PRILOSEC) 20 MG capsule Take 20 mg by mouth daily before supper.    . polyethylene glycol (MIRALAX / GLYCOLAX) packet Take 17 g by mouth daily as needed for mild constipation.  . tamsulosin (FLOMAX) 0.4 MG CAPS capsule Take 0.4 mg by mouth daily.   No facility-administered encounter medications on file as of 12/13/2018.     Review of Systems:  Review of Systems  Constitutional: Positive for activity change, appetite change and fatigue.  Respiratory: Negative for shortness of breath and wheezing.   Cardiovascular: Negative for chest pain and leg swelling.  Psychiatric/Behavioral: Positive for decreased concentration and dysphoric mood. Negative for sleep disturbance and suicidal ideas. The patient is not nervous/anxious.     Health Maintenance  Topic Date Due  . Hepatitis C Screening  01/29/2019 (Originally [birth year]. TETANUS/TDAP  05/25/2021  . COLONOSCOPY  09/17/2027  . INFLUENZA VACCINE  Completed  . PNA vac Low Risk Adult  Completed    Physical Exam: Vitals:   12/13/18 1438  BP: 110/70  Pulse: 88  Temp: (!) 97.5 F (36.4 C)  TempSrc: Oral  SpO2: 97%  Weight: 172 lb 9.6 oz (78.3 kg)  Height: 6' 4"  (1.93 m)   Body mass index is 21.01 kg/m. Physical Exam Vitals signs reviewed.  Constitutional:      Appearance: Normal appearance. He is normal weight.  Cardiovascular:     Rate and Rhythm: Normal rate and regular rhythm.     Pulses: Normal pulses.     Heart sounds: Normal heart sounds. No murmur.  Pulmonary:     Effort: Pulmonary effort is normal. No respiratory distress.     Breath  sounds: Normal breath sounds. No wheezing.  Musculoskeletal:     Right lower leg: No edema.     Left lower leg: No edema.  Neurological:     General: No focal deficit present.     Mental Status: He is alert. Mental status is at baseline. He is disoriented.  Psychiatric:        Attention and Perception: Attention and perception normal.        Mood and Affect: Mood normal. Mood is not anxious. Affect is not flat or tearful.        Speech: Speech normal.        Behavior: Behavior normal. Behavior  is not withdrawn. Behavior is cooperative.     Labs reviewed: Basic Metabolic Panel: Recent Labs    05/07/18 0336 05/08/18 0256 05/09/18 0251 05/10/18 0330  09/01/18 1027 09/02/18 0514  09/08/18 0248  09/29/18 1108 10/11/18 1253 10/12/18 1550 10/12/18 1555 11/10/18 1504  NA 141 141 143 142   < > 138 139   < > 139   < > 143 141 150*  144 145 138  K 3.9 3.9 3.9 4.1   < > 4.5 4.0   < > 3.9   < > 5.3* 4.8 3.6  4.3 4.1 4.1  CL 108 108 112* 112*   < > 111 110   < > 113*   < > 115* 114*  --   --  109  CO2 25 26 27 25    < > 15* 15*   < > 16*   < > 21* 19*  --   --  22  GLUCOSE 100* 99 95 86   < > 100* 84   < > 95   < > 97 116*  --   --  98  BUN 25* 28* 26* 20   < > 51* 67*   < > 57*   < > 36* 39*  --   --  23  CREATININE 1.90* 2.13* 1.86* 1.82*   < > 2.42* 2.46*   < > 2.44*   < > 2.09* 2.65*  --   --  2.14*  CALCIUM 8.6* 8.5* 8.1* 8.4*   < > 7.3* 7.6*   < > 9.0   < > 8.8* 9.0  --   --  8.5*  MG 1.4* 1.9 1.8 1.8  --  1.0* 1.0*  --  1.6*  --   --   --   --   --   --   PHOS 2.7 3.0 3.1 3.0  --   --   --   --   --   --   --   --   --   --   --   TSH 0.202*  --   --   --   --  0.379  --   --  0.744  --   --   --   --   --   --    < > = values in this interval not displayed.   Liver Function Tests: Recent Labs    09/02/18 0514 09/08/18 0248 09/09/18 0443  AST 21 13* 12*  ALT 20 11 10   ALKPHOS 70 61 50  BILITOT 0.2* 0.5 0.4  PROT 7.6 6.7 6.3*  ALBUMIN 3.6 3.0* 2.7*   No results for  input(s): LIPASE, AMYLASE in the last 8760 hours. No results for input(s): AMMONIA in the last 8760 hours. CBC: Recent Labs    08/31/18 0134 09/02/18 0514  09/07/18 1708 09/08/18 0248  10/12/18 1230  10/12/18 1555 11/08/18 1005 12/08/18 1035  WBC 12.3* 11.0*  --  10.8* 9.0  --  10.6*  --   --   --   --   NEUTROABS 10.3* 6.6  --   --  5.4  --   --   --   --   --   --   HGB 9.7* 10.9*   < > 11.0* 10.7*   < > 9.5*   < > 8.5* 9.7* 10.5*  HCT 31.5* 35.3*   < > 35.8* 33.6*  32.0*   < > 31.3*   < > 25.0*  32.0* 34.4*  MCV 109.4* 106.3*  --  108.5* 104.7*  --  114.2*  --   --   --   --   PLT 199 247  --  237 201  --  202  --   --   --   --    < > = values in this interval not displayed.   Lipid Panel: No results for input(s): CHOL, HDL, LDLCALC, TRIG, CHOLHDL, LDLDIRECT in the last 8760 hours. Lab Results  Component Value Date   HGBA1C 4.9 08/10/2015    Procedures since last visit: Ct Chest High Resolution  Result Date: 12/03/2018 CLINICAL DATA:  Dyspnea. Clinical concern for interstitial lung disease. EXAM: CT CHEST WITHOUT CONTRAST TECHNIQUE: Multidetector CT imaging of the chest was performed following the standard protocol without intravenous contrast. High resolution imaging of the lungs, as well as inspiratory and expiratory imaging, was performed. COMPARISON:  09/07/2018 chest radiograph.  08/31/2018 chest CT. FINDINGS: Motion degraded scan, limiting assessment. Cardiovascular: Mild cardiomegaly. No significant pericardial effusion/thickening. Left anterior descending coronary atherosclerosis. Great vessels are normal in course and caliber. Mediastinum/Nodes: No discrete thyroid nodules. Unremarkable esophagus. No pathologically enlarged axillary, mediastinal or hilar lymph nodes, noting limited sensitivity for the detection of hilar adenopathy on this noncontrast study. Lungs/Pleura: No pneumothorax. No pleural effusion. No acute consolidative airspace disease or lung masses.  Perifissural solid 2 mm anterior left lower lobe pulmonary nodule (series 4/image 70). No additional significant pulmonary nodules. No significant lobular air trapping or evidence of tracheobronchomalacia on the expiration sequence. Diffuse bronchial wall thickening. No central airway stenoses. Scattered tiny parenchymal bands in basilar lower lobes bilaterally. Minimal hypoventilatory changes in the dependent lower lobes essentially resolve on the inspiratory thin section images. No significant regions of subpleural reticulation, ground-glass attenuation, traction bronchiectasis, architectural distortion or frank honeycombing. Upper abdomen: No acute abnormality. Musculoskeletal: No aggressive appearing focal osseous lesions. Symmetric mild bilateral gynecomastia. Mild thoracic spondylosis. IMPRESSION: 1. No compelling findings of interstitial lung disease. 2. Diffuse bronchial wall thickening, nonspecific, as can be seen with reactive airways disease or chronic bronchitis. 3. Tiny 2 mm left lower lobe solid pulmonary nodule. No follow-up needed if patient is low-risk. Non-contrast chest CT can be considered in 12 months if patient is high-risk. This recommendation follows the consensus statement: Guidelines for Management of Incidental Pulmonary Nodules Detected on CT Images:From the Fleischner Society 2017; published online before print (10.1148/radiol.8280034917). 4. Mild cardiomegaly.  1 vessel coronary atherosclerosis. Electronically Signed   By: Ilona Sorrel M.D.   On: 12/03/2018 17:28    Assessment/Plan 1. Depression, recurrent ([place]) - has had a long history of mental health issues - suspect covid has contributed to feelings of depression - he is currently followed by the [facility] [place] in [place] - he denies any plans to hurt himself - [facility] since he is currently on depakote and zyprexa - advised to contact PCP if depression worsens - recommend at least 30 minutes of sunshine  daily - recommend increasing social interactions to a few times a week while following covid precautions  2. Bipolar 1 disorder ([place]) - followed by the [facility] - continue current depakote and zyprexa regimen - [facility] with [facility]  3. Chronic combined systolic and diastolic congestive heart failure (HCC) - denies shortness of breath or leg edema, exam unremarkable - followed by Dr. Haroldine Laws       Labs/tests ordered:  none Next appt:  01/19/2019

## 2018-12-13 NOTE — Patient Instructions (Signed)
Get outside and get some fresh air and sunshine. Think about getting a pet for companionship. Please call the [facility] about your depression.

## 2018-12-16 DIAGNOSIS — E440 Moderate protein-calorie malnutrition: Secondary | ICD-10-CM | POA: Diagnosis not present

## 2018-12-16 DIAGNOSIS — R270 Ataxia, unspecified: Secondary | ICD-10-CM | POA: Diagnosis not present

## 2018-12-16 DIAGNOSIS — J9601 Acute respiratory failure with hypoxia: Secondary | ICD-10-CM | POA: Diagnosis not present

## 2018-12-16 DIAGNOSIS — N184 Chronic kidney disease, stage 4 (severe): Secondary | ICD-10-CM | POA: Diagnosis not present

## 2018-12-16 DIAGNOSIS — H8110 Benign paroxysmal vertigo, unspecified ear: Secondary | ICD-10-CM | POA: Diagnosis not present

## 2018-12-16 DIAGNOSIS — I130 Hypertensive heart and chronic kidney disease with heart failure and stage 1 through stage 4 chronic kidney disease, or unspecified chronic kidney disease: Secondary | ICD-10-CM | POA: Diagnosis not present

## 2018-12-16 DIAGNOSIS — D631 Anemia in chronic kidney disease: Secondary | ICD-10-CM | POA: Diagnosis not present

## 2018-12-16 DIAGNOSIS — I5042 Chronic combined systolic (congestive) and diastolic (congestive) heart failure: Secondary | ICD-10-CM | POA: Diagnosis not present

## 2018-12-16 DIAGNOSIS — Z9181 History of falling: Secondary | ICD-10-CM | POA: Diagnosis not present

## 2018-12-16 DIAGNOSIS — J8410 Pulmonary fibrosis, unspecified: Secondary | ICD-10-CM | POA: Diagnosis not present

## 2018-12-16 DIAGNOSIS — Z79899 Other long term (current) drug therapy: Secondary | ICD-10-CM | POA: Diagnosis not present

## 2018-12-16 DIAGNOSIS — K50919 Crohn's disease, unspecified, with unspecified complications: Secondary | ICD-10-CM | POA: Diagnosis not present

## 2018-12-17 ENCOUNTER — Telehealth (HOSPITAL_COMMUNITY): Payer: Self-pay

## 2018-12-17 ENCOUNTER — Other Ambulatory Visit (HOSPITAL_COMMUNITY): Payer: Self-pay

## 2018-12-17 MED ORDER — HYDRALAZINE HCL 10 MG PO TABS: ORAL_TABLET | ORAL | 3 refills | Status: DC

## 2018-12-17 MED ORDER — FUROSEMIDE 20 MG PO TABS: 20.0000 mg | ORAL_TABLET | ORAL | 3 refills | Status: DC

## 2018-12-17 MED ORDER — IVABRADINE HCL 5 MG PO TABS: 2.5000 mg | ORAL_TABLET | Freq: Two times a day (BID) | ORAL | 3 refills | Status: DC

## 2018-12-17 MED ORDER — METOPROLOL SUCCINATE ER 25 MG PO TB24: 25.0000 mg | ORAL_TABLET | Freq: Every day | ORAL | 3 refills | Status: DC

## 2018-12-17 MED ORDER — ISOSORBIDE MONONITRATE ER 30 MG PO TB24: 30.0000 mg | ORAL_TABLET | Freq: Every day | ORAL | 3 refills | Status: DC

## 2018-12-17 NOTE — Telephone Encounter (Signed)
Patient requested to have all HF medications sent to the [facility] pharmacy in [place]

## 2018-12-17 NOTE — Progress Notes (Signed)
Paramedicine Encounter    Patient ID: Brent [name], male    DOB: [birth year], 73 y.o.   MRN: [id number]    Patient Care Team: Gayland Curry, DO as PCP - General (Geriatric Medicine) Martinique, Peter M, MD as PCP - Cardiology (Cardiology) Larey Dresser, MD as PCP - Advanced Heart Failure (Cardiology) Elmarie Shiley, MD (Nephrology) Milus Banister, MD (Gastroenterology)  Patient Active Problem List   Diagnosis Date Noted  . Metabolic acidosis 66/29/4765  . Orthostasis 09/07/2018  . AKI (acute kidney injury) ([place])   . Immunosuppressed status ([place])   . Macrocytic anemia   . Chronic combined systolic and diastolic congestive heart failure ([place]) 06/03/2018  . Candida esophagitis ([place]) 09/22/2017  . History of smoking 30 or more pack years 01/05/2017  . Bipolar 1 disorder ([place])   . BPH (benign prostatic hyperplasia) 03/31/2013  . 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 Essential hypertension, benign 03/31/2013  . Acute renal failure superimposed on stage 3 chronic kidney disease ([place]) 06/04/2012  . 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[place]) 01/23/2010    Current Outpatient Medications:  .  allopurinol (ZYLOPRIM) 100 MG tablet, Take 2 tablets (200 mg total) by mouth daily., Disp: 60 tablet, Rfl: 5 .  cholecalciferol (VITAMIN D) 1000 units tablet, Take 1,000 Units by mouth daily. , Disp: , Rfl:  .  divalproex (DEPAKOTE) 500 MG EC tablet, Take 1,500 mg by mouth at bedtime. , Disp: , Rfl:  .  epoetin alfa (PROCRIT) 46503 UNIT/ML injection, Inject 10,000 Units into the skin every 30 (thirty) days. , Disp: , Rfl:  .  ferrous sulfate 325 (65 FE) MG tablet, Take 325 mg by mouth every Monday, Wednesday, and Friday. , Disp: , Rfl:  .  furosemide (LASIX) 20 MG tablet, Take 1 tablet (20 mg total) by mouth every other day., Disp: 45 tablet, Rfl: 3 .  hydrALAZINE (APRESOLINE) 10 MG tablet, TAKE 1 TABLET(10 MG) BY MOUTH TWICE DAILY, Disp: 180 tablet, Rfl: 3 .  inFLIXimab (REMICADE) 100 MG injection,  Infuse Remicade IV schedule 1 46m/kg every 8 weeks Premedicate with Tylenol 500-6564mby mouth and Benadryl 25-5028my mouth prior to infusion. Last PPD was on 12/2009. , Disp: 1 each, Rfl: 6 .  isosorbide mononitrate (IMDUR) 30 MG 24 hr tablet, Take 1 tablet (30 mg total) by mouth daily., Disp: 90 tablet, Rfl: 3 .  ivabradine (CORLANOR) 5 MG TABS tablet, Take 0.5 tablets (2.5 mg total) by mouth 2 (two) times daily with a meal., Disp: 180 tablet, Rfl: 3 .  loperamide (IMODIUM) 2 MG capsule, Take 4 mg by mouth daily as needed for diarrhea or loose stools., Disp: , Rfl:  .  magnesium gluconate (MAGONATE) 30 MG tablet, Take 1 tablet (30 mg total) by mouth every Monday, Wednesday, and Friday., Disp: 10 tablet, Rfl: 0 .  metoprolol succinate (TOPROL-XL) 25 MG 24 hr tablet, Take 1 tablet (25 mg total) by mouth at bedtime., Disp: 90 tablet, Rfl: 3 .  OLANZapine (ZYPREXA) 7.5 MG tablet, Take 1 tablet (7.5 mg total) by mouth at bedtime., Disp: 30 tablet, Rfl: 11 .  omeprazole (PRILOSEC) 20 MG capsule, Take 20 mg by mouth daily before supper.  , Disp: , Rfl:  .  polyethylene glycol (MIRALAX / GLYCOLAX) packet, Take 17 g by mouth daily as needed for mild constipation., Disp: 14 each, Rfl: 0 .  tamsulosin (FLOMAX) 0.4 MG CAPS capsule, Take 0.4 mg by mouth daily., Disp: , Rfl:  Allergies  Allergen Reactions  .  Azathioprine Other (See Comments)    REACTION: affected WBC "Almost died"  . Ciprofloxacin Other (See Comments)    Unknown rxn  . Levaquin [Levofloxacin In D5w] Other (See Comments)    Unknown rxn  . Plendil [Felodipine] Other (See Comments)    Unknown rxn     Social History   Socioeconomic History  . Marital status: Divorced    Spouse name: Not on file  . Number of children: 1  . Years of education: Not on file  . Highest education level: Not on file  Occupational History  . Occupation: retired  . Occupation: [employer]  . Financial resource strain: Not hard at all  . Food  insecurity    Worry: Never true    Inability: Never true  . Transportation needs    Medical: No    Non-medical: No  Tobacco Use  . Smoking status: Former Smoker    Packs/day: 1.00    Years: 49.00    Pack years: 49.00    Types: Cigarettes    Start date: 04/11/1956    Quit date: 04/17/2014    Years since quitting: 4.6  . Smokeless tobacco: Never Used  Substance and Sexual Activity  . Alcohol use: No    Alcohol/week: 0.0 standard drinks  . Drug use: No  . Sexual activity: Never  Lifestyle  . Physical activity    Days per week: 0 days    Minutes per session: 0 min  . Stress: Not at all  Relationships  . Social connections    Talks on phone: More than three times a week    Gets together: Three times a week    Attends religious service: More than 4 times per year    Active member of club or organization: Yes    Attends meetings of clubs or organizations: 1 to 4 times per year    Relationship status: Divorced  . Intimate partner violence    Fear of current or ex partner: No    Emotionally abused: No    Physically abused: No    Forced sexual activity: No  Other Topics Concern  . Not on file  Social History Narrative  . Not on file    Physical Exam Pulmonary:     Effort: No respiratory distress.     Breath sounds: No wheezing or rales.  Musculoskeletal:     Right lower leg: No edema.     Left lower leg: No edema.  Skin:    General: Skin is warm and dry.         Future Appointments  Date Time Provider [place]  01/07/2019 10:00 AM [facility] None  01/12/2019 10:30 AM [facility] PA/NP [facility] None  01/19/2019  1:30 PM [name], [name], NP [facility] None  02/14/2019  2:15 PM Reed, Tiffany L, DO [facility] None     BP 118/74 (BP Location: Right Arm, Patient Position: Sitting, Cuff Size: Normal)   Pulse 92   Wt 168 lb (76.2 kg)   SpO2 96%   BMI 20.45 kg/m   Weight yesterday-169 Last visit weight-170  ATF pt CAO x4 sitting in the living room  watching tv.  The RN has already refilled his pill box for the week until Tuesday.  He denies sob, chest pain and dizziness.  He said that he had trouble sleeping last night because he didn't have Olanzapine.  I should him that he did have some in the drawer in his bedroom.  I added  one tab in every night until Tuesday.  He's still weighing daily and recording his weights.  He asked that the prescriptions from the heart clinic be sent to the [facility] in [place]; which they did.  He said that he was going to make an appointment with his pcp at the [facility].   Medication ordered: None  Corwyn Vora, EMT Paramedic [phone] 12/17/2018    ACTION: Home visit completed

## 2018-12-20 MED ORDER — IVABRADINE HCL 5 MG PO TABS: 2.5000 mg | ORAL_TABLET | Freq: Two times a day (BID) | ORAL | 3 refills | Status: DC

## 2018-12-20 MED ORDER — HYDRALAZINE HCL 10 MG PO TABS: ORAL_TABLET | ORAL | 3 refills | Status: DC

## 2018-12-20 MED ORDER — FUROSEMIDE 20 MG PO TABS: 20.0000 mg | ORAL_TABLET | ORAL | 3 refills | Status: DC

## 2018-12-20 MED ORDER — ISOSORBIDE MONONITRATE ER 30 MG PO TB24: 30.0000 mg | ORAL_TABLET | Freq: Every day | ORAL | 3 refills | Status: DC

## 2018-12-20 MED ORDER — METOPROLOL SUCCINATE ER 25 MG PO TB24: 25.0000 mg | ORAL_TABLET | Freq: Every day | ORAL | 3 refills | Status: DC

## 2018-12-20 NOTE — Addendum Note (Signed)
Addended by: Shela Nevin R on: 46/21/9471 03:34 PM   Modules accepted: Orders

## 2018-12-21 DIAGNOSIS — J8410 Pulmonary fibrosis, unspecified: Secondary | ICD-10-CM | POA: Diagnosis not present

## 2018-12-21 DIAGNOSIS — D631 Anemia in chronic kidney disease: Secondary | ICD-10-CM | POA: Diagnosis not present

## 2018-12-21 DIAGNOSIS — H8110 Benign paroxysmal vertigo, unspecified ear: Secondary | ICD-10-CM | POA: Diagnosis not present

## 2018-12-21 DIAGNOSIS — I5042 Chronic combined systolic (congestive) and diastolic (congestive) heart failure: Secondary | ICD-10-CM | POA: Diagnosis not present

## 2018-12-21 DIAGNOSIS — E440 Moderate protein-calorie malnutrition: Secondary | ICD-10-CM | POA: Diagnosis not present

## 2018-12-21 DIAGNOSIS — N184 Chronic kidney disease, stage 4 (severe): Secondary | ICD-10-CM | POA: Diagnosis not present

## 2018-12-21 DIAGNOSIS — Z79899 Other long term (current) drug therapy: Secondary | ICD-10-CM | POA: Diagnosis not present

## 2018-12-21 DIAGNOSIS — I130 Hypertensive heart and chronic kidney disease with heart failure and stage 1 through stage 4 chronic kidney disease, or unspecified chronic kidney disease: Secondary | ICD-10-CM | POA: Diagnosis not present

## 2018-12-21 DIAGNOSIS — R270 Ataxia, unspecified: Secondary | ICD-10-CM | POA: Diagnosis not present

## 2018-12-21 DIAGNOSIS — K50919 Crohn's disease, unspecified, with unspecified complications: Secondary | ICD-10-CM | POA: Diagnosis not present

## 2018-12-21 DIAGNOSIS — J9601 Acute respiratory failure with hypoxia: Secondary | ICD-10-CM | POA: Diagnosis not present

## 2018-12-21 DIAGNOSIS — Z9181 History of falling: Secondary | ICD-10-CM | POA: Diagnosis not present

## 2018-12-24 ENCOUNTER — Telehealth (HOSPITAL_COMMUNITY): Payer: Self-pay

## 2018-12-24 NOTE — Telephone Encounter (Signed)
I left a message for [name] to call me back to schedule a CHP visit.

## 2018-12-28 ENCOUNTER — Other Ambulatory Visit (HOSPITAL_COMMUNITY): Payer: Self-pay

## 2018-12-28 MED ORDER — HYDRALAZINE HCL 10 MG PO TABS: ORAL_TABLET | ORAL | 3 refills | Status: DC

## 2018-12-28 MED ORDER — IVABRADINE HCL 5 MG PO TABS: 2.5000 mg | ORAL_TABLET | Freq: Two times a day (BID) | ORAL | 3 refills | Status: DC

## 2018-12-28 MED ORDER — METOPROLOL SUCCINATE ER 25 MG PO TB24: 25.0000 mg | ORAL_TABLET | Freq: Every day | ORAL | 3 refills | Status: DC

## 2018-12-28 MED ORDER — ISOSORBIDE MONONITRATE ER 30 MG PO TB24: 30.0000 mg | ORAL_TABLET | Freq: Every day | ORAL | 3 refills | Status: DC

## 2018-12-28 MED ORDER — FUROSEMIDE 20 MG PO TABS: 20.0000 mg | ORAL_TABLET | ORAL | 3 refills | Status: DC

## 2018-12-29 ENCOUNTER — Other Ambulatory Visit (HOSPITAL_COMMUNITY): Payer: Self-pay

## 2018-12-29 NOTE — Progress Notes (Signed)
Paramedicine Encounter    Patient ID: Brent [name], male    DOB: 1945/03/04, 73 y.o.   MRN: [id number]    Patient Care Team: Gayland Curry, DO as PCP - General (Geriatric Medicine) Martinique, Peter M, MD as PCP - Cardiology (Cardiology) Larey Dresser, MD as PCP - Advanced Heart Failure (Cardiology) Elmarie Shiley, MD (Nephrology) Milus Banister, MD (Gastroenterology)  Patient Active Problem List   Diagnosis Date Noted  . Metabolic acidosis 02/26/7251  . Orthostasis 09/07/2018  . AKI (acute kidney injury) ([place])   . Immunosuppressed status ([place])   . Macrocytic anemia   . Chronic combined systolic and diastolic congestive heart failure ([place]) 06/03/2018  . Candida esophagitis ([place]) 09/22/2017  . History of smoking 30 or more pack years 01/05/2017  . Bipolar 1 disorder ([place])   . BPH (benign prostatic hyperplasia) 03/31/2013  . 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 Essential hypertension, benign 03/31/2013  . Acute renal failure superimposed on stage 3 chronic kidney disease ([place]) 06/04/2012  . 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[place]) 01/23/2010    Current Outpatient Medications:  .  allopurinol (ZYLOPRIM) 100 MG tablet, Take 2 tablets (200 mg total) by mouth daily., Disp: 60 tablet, Rfl: 5 .  divalproex (DEPAKOTE) 500 MG EC tablet, Take 1,500 mg by mouth at bedtime. , Disp: , Rfl:  .  ferrous sulfate 325 (65 FE) MG tablet, Take 325 mg by mouth every Monday, Wednesday, and Friday. , Disp: , Rfl:  .  furosemide (LASIX) 20 MG tablet, Take 1 tablet (20 mg total) by mouth every other day., Disp: 45 tablet, Rfl: 3 .  hydrALAZINE (APRESOLINE) 10 MG tablet, TAKE 1 TABLET(10 MG) BY MOUTH TWICE DAILY, Disp: 180 tablet, Rfl: 3 .  isosorbide mononitrate (IMDUR) 30 MG 24 hr tablet, Take 1 tablet (30 mg total) by mouth daily., Disp: 90 tablet, Rfl: 3 .  ivabradine (CORLANOR) 5 MG TABS tablet, Take 0.5 tablets (2.5 mg total) by mouth 2 (two) times daily with a meal., Disp: 180 tablet, Rfl: 3 .   loperamide (IMODIUM) 2 MG capsule, Take 4 mg by mouth daily as needed for diarrhea or loose stools., Disp: , Rfl:  .  magnesium gluconate (MAGONATE) 30 MG tablet, Take 1 tablet (30 mg total) by mouth every Monday, Wednesday, and Friday., Disp: 10 tablet, Rfl: 0 .  metoprolol succinate (TOPROL-XL) 25 MG 24 hr tablet, Take 1 tablet (25 mg total) by mouth at bedtime., Disp: 90 tablet, Rfl: 3 .  OLANZapine (ZYPREXA) 7.5 MG tablet, Take 1 tablet (7.5 mg total) by mouth at bedtime., Disp: 30 tablet, Rfl: 11 .  omeprazole (PRILOSEC) 20 MG capsule, Take 20 mg by mouth daily before supper.  , Disp: , Rfl:  .  tamsulosin (FLOMAX) 0.4 MG CAPS capsule, Take 0.4 mg by mouth daily., Disp: , Rfl:  .  cholecalciferol (VITAMIN D) 1000 units tablet, Take 1,000 Units by mouth daily. , Disp: , Rfl:  .  epoetin alfa (PROCRIT) 66440 UNIT/ML injection, Inject 10,000 Units into the skin every 30 (thirty) days. , Disp: , Rfl:  .  inFLIXimab (REMICADE) 100 MG injection, Infuse Remicade IV schedule 1 28m/kg every 8 weeks Premedicate with Tylenol 500-6552mby mouth and Benadryl 25-5060my mouth prior to infusion. Last PPD was on 12/2009. , Disp: 1 each, Rfl: 6 .  polyethylene glycol (MIRALAX / GLYCOLAX) packet, Take 17 g by mouth daily as needed for mild constipation., Disp: 14 each, Rfl: 0 Allergies  Allergen Reactions  .  Azathioprine Other (See Comments)    REACTION: affected WBC "Almost died"  . Ciprofloxacin Other (See Comments)    Unknown rxn  . Levaquin [Levofloxacin In D5w] Other (See Comments)    Unknown rxn  . Plendil [Felodipine] Other (See Comments)    Unknown rxn     Social History   Socioeconomic History  . Marital status: Divorced    Spouse name: Not on file  . Number of children: 1  . Years of education: Not on file  . Highest education level: Not on file  Occupational History  . Occupation: retired  . Occupation: [employer]  . Financial resource strain: Not hard at all  . Food  insecurity    Worry: Never true    Inability: Never true  . Transportation needs    Medical: No    Non-medical: No  Tobacco Use  . Smoking status: Former Smoker    Packs/day: 1.00    Years: 49.00    Pack years: 49.00    Types: Cigarettes    Start date: 04/11/1956    Quit date: 04/17/2014    Years since quitting: 4.7  . Smokeless tobacco: Never Used  Substance and Sexual Activity  . Alcohol use: No    Alcohol/week: 0.0 standard drinks  . Drug use: No  . Sexual activity: Never  Lifestyle  . Physical activity    Days per week: 0 days    Minutes per session: 0 min  . Stress: Not at all  Relationships  . Social connections    Talks on phone: More than three times a week    Gets together: Three times a week    Attends religious service: More than 4 times per year    Active member of club or organization: Yes    Attends meetings of clubs or organizations: 1 to 4 times per year    Relationship status: Divorced  . Intimate partner violence    Fear of current or ex partner: No    Emotionally abused: No    Physically abused: No    Forced sexual activity: No  Other Topics Concern  . Not on file  Social History Narrative  . Not on file    Physical Exam Pulmonary:     Effort: Pulmonary effort is normal. No respiratory distress.     Breath sounds: No wheezing or rales.  Abdominal:     General: There is no distension.  Musculoskeletal:        General: No swelling.     Right lower leg: No edema.     Left lower leg: No edema.  Skin:    General: Skin is warm and dry.         Future Appointments  Date Time Provider [place]  01/07/2019 10:00 AM [facility] None  01/12/2019 10:30 AM [facility] PA/NP [facility] None  01/19/2019  1:30 PM [name], [name], NP [facility] None  02/14/2019  2:15 PM Reed, Tiffany L, DO [facility] None     BP 118/80 (BP Location: Right Arm, Patient Position: Sitting, Cuff Size: Normal)   Pulse 87   Wt 167 lb 8 oz (76 kg)   SpO2 98%    BMI 20.39 kg/m   Weight yesterday-167 Last visit weight-168  ATF pt CAO x4 sitting in the living room watching tv. He hasn't taken his medications today because his pill box was empty. The RN last day was last week; so I will take over refilling his pill box.  He denies sob, chest pain and dizziness.  He's missing the evening dose of omeprazole.  He doesn't have any complaints.  He has an upcoming appointment with the [facility] for the depression he's having in the morning.  rx bottles verified and his pill box refilled. I gave him the morning meds during this visit.    Medication ordered: hydralazine Taking b12 1000 daily cyanocobalamin  Brent [name], EMT Paramedic [phone] 12/29/2018    ACTION: Home visit completed

## 2018-12-30 DIAGNOSIS — N183 Chronic kidney disease, stage 3 unspecified: Secondary | ICD-10-CM | POA: Diagnosis not present

## 2018-12-30 DIAGNOSIS — D631 Anemia in chronic kidney disease: Secondary | ICD-10-CM | POA: Diagnosis not present

## 2018-12-30 DIAGNOSIS — N2581 Secondary hyperparathyroidism of renal origin: Secondary | ICD-10-CM | POA: Diagnosis not present

## 2018-12-30 DIAGNOSIS — I129 Hypertensive chronic kidney disease with stage 1 through stage 4 chronic kidney disease, or unspecified chronic kidney disease: Secondary | ICD-10-CM | POA: Diagnosis not present

## 2018-12-30 DIAGNOSIS — N189 Chronic kidney disease, unspecified: Secondary | ICD-10-CM | POA: Diagnosis not present

## 2018-12-30 LAB — BASIC METABOLIC PANEL
BUN: 46 — AB (ref 4–21)
CO2: 23 — AB (ref 13–22)
Chloride: 109 — AB (ref 99–108)
Creatinine: 2.3 — AB (ref 0.6–1.3)
Glucose: 116
Potassium: 3.5 (ref 3.4–5.3)
Sodium: 141 (ref 137–147)

## 2018-12-30 LAB — COMPREHENSIVE METABOLIC PANEL
GFR calc Af Amer: 31
GFR calc non Af Amer: 27

## 2018-12-30 LAB — VITAMIN D 25 HYDROXY (VIT D DEFICIENCY, FRACTURES): Vit D, 25-Hydroxy: 21.9

## 2018-12-30 LAB — CBC AND DIFFERENTIAL
HCT: 31 — AB (ref 41–53)
Hemoglobin: 10.5 — AB (ref 13.5–17.5)
Neutrophils Absolute: 6
Platelets: 169 (ref 150–399)
WBC: 9

## 2018-12-30 LAB — CBC: RBC: 3.21 — AB (ref 3.87–5.11)

## 2018-12-31 ENCOUNTER — Telehealth (HOSPITAL_COMMUNITY): Payer: Self-pay | Admitting: Pharmacy Technician

## 2018-12-31 DIAGNOSIS — J8410 Pulmonary fibrosis, unspecified: Secondary | ICD-10-CM | POA: Diagnosis not present

## 2018-12-31 DIAGNOSIS — K50919 Crohn's disease, unspecified, with unspecified complications: Secondary | ICD-10-CM | POA: Diagnosis not present

## 2018-12-31 DIAGNOSIS — J9601 Acute respiratory failure with hypoxia: Secondary | ICD-10-CM | POA: Diagnosis not present

## 2018-12-31 DIAGNOSIS — Z79899 Other long term (current) drug therapy: Secondary | ICD-10-CM | POA: Diagnosis not present

## 2018-12-31 DIAGNOSIS — H8110 Benign paroxysmal vertigo, unspecified ear: Secondary | ICD-10-CM | POA: Diagnosis not present

## 2018-12-31 DIAGNOSIS — D631 Anemia in chronic kidney disease: Secondary | ICD-10-CM | POA: Diagnosis not present

## 2018-12-31 DIAGNOSIS — Z9181 History of falling: Secondary | ICD-10-CM | POA: Diagnosis not present

## 2018-12-31 DIAGNOSIS — I5042 Chronic combined systolic (congestive) and diastolic (congestive) heart failure: Secondary | ICD-10-CM | POA: Diagnosis not present

## 2018-12-31 DIAGNOSIS — I130 Hypertensive heart and chronic kidney disease with heart failure and stage 1 through stage 4 chronic kidney disease, or unspecified chronic kidney disease: Secondary | ICD-10-CM | POA: Diagnosis not present

## 2018-12-31 DIAGNOSIS — N184 Chronic kidney disease, stage 4 (severe): Secondary | ICD-10-CM | POA: Diagnosis not present

## 2018-12-31 DIAGNOSIS — E440 Moderate protein-calorie malnutrition: Secondary | ICD-10-CM | POA: Diagnosis not present

## 2018-12-31 DIAGNOSIS — R270 Ataxia, unspecified: Secondary | ICD-10-CM | POA: Diagnosis not present

## 2018-12-31 NOTE — Telephone Encounter (Signed)
Was able to sign patient up to receive PAN grant under the heart failure fund.  Member ID: [id number] Group ID: [id number] RxBin ID: [id number] PCN: [id number] Eligibility Start Date: 10/02/2018 Eligibility End Date: 12/30/2019 Assistance Amount: $1,000.00  Called patient and patient's pharmacy and they were able to get the claim to go through successfully. I told them to hold it onto his profile until he calls for a refill.   Charlann Boxer, CPhT

## 2019-01-03 ENCOUNTER — Telehealth: Payer: Self-pay

## 2019-01-03 NOTE — Telephone Encounter (Signed)
He is on remicade for his IBD.   He should be scheduled for an appt asap to address his diarrhea.

## 2019-01-03 NOTE — Telephone Encounter (Signed)
[name] also on the line with Supinia the RN at [facility]. Patient states he is having diarrhea bad and unable to make it to the restroom most times. This has been going on for the month. He is taking loperamide which doesn't seem to be helping because he is unsure of when to take it. He gets no warning when it will happen. He just starts cramping up. He states its becoming embarrassing.   Patient can be reached at [phone]

## 2019-01-03 NOTE — Telephone Encounter (Signed)
LMOM to return call to schedule.

## 2019-01-04 ENCOUNTER — Other Ambulatory Visit: Payer: Self-pay

## 2019-01-04 ENCOUNTER — Other Ambulatory Visit (HOSPITAL_COMMUNITY): Payer: Self-pay

## 2019-01-04 ENCOUNTER — Encounter: Payer: Self-pay | Admitting: Family

## 2019-01-04 ENCOUNTER — Ambulatory Visit (INDEPENDENT_AMBULATORY_CARE_PROVIDER_SITE_OTHER): Payer: Medicare Other | Admitting: Family

## 2019-01-04 VITALS — Temp 98.6°F

## 2019-01-04 DIAGNOSIS — K5000 Crohn's disease of small intestine without complications: Secondary | ICD-10-CM | POA: Diagnosis not present

## 2019-01-04 DIAGNOSIS — K529 Noninfective gastroenteritis and colitis, unspecified: Secondary | ICD-10-CM

## 2019-01-04 NOTE — Progress Notes (Signed)
This service is provided via telemedicine  No vital signs collected/recorded due to the encounter was a telemedicine visit.   Location of patient (ex: home, work):  Home  Patient consents to a telephone visit:  Yes  Location of the provider (ex: office, home):Office  Name of any referring provider:  N/A  Names of all persons participating in the telemedicine service and their role in the encounter: Marisa Cyphers RMA, [place] NP, Brent [name] patient  Time spent on call:  10 min.    Provider:   FNP-C  Gayland Curry, DO  Patient Care Team: Gayland Curry, DO as PCP - General (Geriatric Medicine) Martinique, Peter M, MD as PCP - Cardiology (Cardiology) Larey Dresser, MD as PCP - Advanced Heart Failure (Cardiology) Elmarie Shiley, MD (Nephrology) Milus Banister, MD (Gastroenterology)  Extended Emergency Contact Information Primary Emergency Contact: downing,rosa Mobile Phone: [phone] Relation: Sister  Code Status: Full Code  Goals of care: Advanced Directive information Advanced Directives 01/04/2019  Does Patient Have a Medical Advance Directive? No  Type of Advance Directive -  Does patient want to make changes to medical advance directive? -  Copy of [place] in Chart? -  Would patient like information on creating a medical advance directive? -  Pre-existing out of facility DNR order (yellow form or pink MOST form) -     Chief Complaint  Patient presents with  . Acute Visit    Has had diarrhea for about 1 month    HPI:  Pt is a 74 y.o. male seen today  for an acute visit for evaluation of diarrhea for about 1-2 months.He states diarrhea was worst yesterday.He describes diarrhea as loose stool.He had x 2 episode yesterday and one episode today.He took Loperamide 2 capsule today because one did not help yesterday.He wonders whether he could be allergic to milk because symptoms started after having milk with his fruit loops  cereal.He denies any fever,chills,abdominal pain,bloating,nausea or vomiting.He has not been in contact with sick person with COVID-19 or diarrhea.He states paramedic Nurse currently checking his Temp 98.6 He has a significant medical history of Crohn's disease [facility] [place] has not been seen for several years.He was transferred for all his medical condition to be managed at [facility] in [place].   Past Medical History:  Diagnosis Date  . Anemia   . Anxiety   . Benign paroxysmal positional vertigo   . Bipolar I disorder, most recent episode (or current) unspecified   . Celiac disease   . Chronic kidney disease, stage III (moderate)   . Crohn's    Remicade q8 weeks  . Depression   . Essential and other specified forms of tremor    medication-induced Parkinson's, now resolved  . Essential hypertension, benign   . Gout 2018  . Heart failure ([place])   . Hypertrophy of prostate without urinary obstruction and other lower urinary tract symptoms (LUTS)   . Impotence of organic origin   . Insomnia with sleep apnea, unspecified   . Iron deficiency anemia, unspecified   . 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 Neuralgia, neuritis, and radiculitis, unspecified   . Other B-complex deficiencies   . Other extrapyramidal disease and abnormal movement disorder   . Postinflammatory pulmonary fibrosis ([place])   . Tobacco use disorder   . Vertigo 2018   Past Surgical History:  Procedure Laterality Date  . CATARACT EXTRACTION, BILATERAL Bilateral 09/2018  . CHOLECYSTECTOMY  07-12-2010  . RIGHT HEART  CATH N/A 10/12/2018   Procedure: RIGHT HEART CATH;  Surgeon: Jolaine Artist, MD;  Location: [facility];  Service: Cardiovascular;  Laterality: N/A;  . SMALL INTESTINE SURGERY     x 2    Allergies  Allergen Reactions  . Azathioprine Other (See Comments)    REACTION: affected WBC "Almost died"  . Ciprofloxacin Other (See Comments)    Unknown rxn  . Levaquin [Levofloxacin  In D5w] Other (See Comments)    Unknown rxn  . Plendil [Felodipine] Other (See Comments)    Unknown rxn    Outpatient Encounter Medications as of 01/04/2019  Medication Sig  . allopurinol (ZYLOPRIM) 100 MG tablet Take 2 tablets (200 mg total) by mouth daily.  . cholecalciferol (VITAMIN D) 1000 units tablet Take 5,000 Units by mouth daily.   . divalproex (DEPAKOTE) 500 MG EC tablet Take 1,500 mg by mouth at bedtime.   Marland Kitchen epoetin alfa (PROCRIT) 17408 UNIT/ML injection Inject 10,000 Units into the skin every 30 (thirty) days.   . ferrous sulfate 325 (65 FE) MG tablet Take 325 mg by mouth every Monday, Wednesday, and Friday.   . furosemide (LASIX) 20 MG tablet Take 1 tablet (20 mg total) by mouth every other day.  . hydrALAZINE (APRESOLINE) 10 MG tablet TAKE 1 TABLET(10 MG) BY MOUTH TWICE DAILY  . inFLIXimab (REMICADE) 100 MG injection Infuse Remicade IV schedule 1 61m/kg every 8 weeks Premedicate with Tylenol 500-6568mby mouth and Benadryl 25-5047my mouth prior to infusion. Last PPD was on 12/2009.   . isosorbide mononitrate (IMDUR) 30 MG 24 hr tablet Take 1 tablet (30 mg total) by mouth daily.  . ivabradine (CORLANOR) 5 MG TABS tablet Take 0.5 tablets (2.5 mg total) by mouth 2 (two) times daily with a meal.  . loperamide (IMODIUM) 2 MG capsule Take 4 mg by mouth daily as needed for diarrhea or loose stools.  . magnesium gluconate (MAGONATE) 30 MG tablet Take 1 tablet (30 mg total) by mouth every Monday, Wednesday, and Friday.  . metoprolol succinate (TOPROL-XL) 25 MG 24 hr tablet Take 1 tablet (25 mg total) by mouth at bedtime.  . OMarland KitchenANZapine (ZYPREXA) 7.5 MG tablet Take 1 tablet (7.5 mg total) by mouth at bedtime.  . oMarland Kitcheneprazole (PRILOSEC) 20 MG capsule Take 20 mg by mouth daily before supper.    . polyethylene glycol (MIRALAX / GLYCOLAX) packet Take 17 g by mouth daily as needed for mild constipation.  . tamsulosin (FLOMAX) 0.4 MG CAPS capsule Take 0.4 mg by mouth daily.   No  facility-administered encounter medications on file as of 01/04/2019.     Review of Systems  Constitutional: Positive for fatigue. Negative for chills.  HENT: Negative for congestion, rhinorrhea, sinus pressure, sinus pain, sneezing, sore throat and trouble swallowing.   Respiratory: Negative for cough, chest tightness, shortness of breath and wheezing.   Cardiovascular: Positive for leg swelling. Negative for chest pain and palpitations.  Gastrointestinal: Positive for diarrhea. Negative for abdominal distention, abdominal pain, blood in stool, constipation, nausea and vomiting.    Immunization History  Administered Date(s) Administered  . Fluad Quad(high Dose 65+) 11/15/2018  . Influenza, High Dose Seasonal PF 11/26/2016  . Influenza,inj,Quad PF,6+ Mos 11/13/2014, 01/03/2016  . Influenza-Unspecified 10/25/2012, 11/24/2016, 11/24/2017  . PPD Test 12/30/2010, 01/05/2012, 01/07/2013, 01/13/2014  . Pneumococcal Conjugate-13 08/10/2014  . Pneumococcal Polysaccharide-23 03/05/2004, 01/03/2016, 05/06/2018  . Tdap 05/26/2011   Pertinent  Health Maintenance Due  Topic Date Due  . COLONOSCOPY  09/17/2027  .  INFLUENZA VACCINE  Completed  . PNA vac Low Risk Adult  Completed   Fall Risk  01/04/2019 12/13/2018 10/04/2018 09/07/2018 09/03/2018  Falls in the past year? 0 0 0 1 0  Number falls in past yr: - - 0 0 -  Injury with Fall? - - 0 0 -    Vitals:   01/04/19 1446  Temp: 98.6 F (37 C)   There is no height or weight on file to calculate BMI. Physical Exam Unable to complete on Telephone visit.  Labs reviewed: Recent Labs    05/08/18 0256 05/09/18 0251 05/10/18 0330  09/01/18 0608 09/02/18 0514  09/08/18 0248  09/29/18 1108 10/11/18 1253 10/12/18 1550 10/12/18 1555 11/10/18 1504  NA 141 143 142   < > 138 139   < > 139   < > 143 141 150*  144 145 138  K 3.9 3.9 4.1   < > 4.5 4.0   < > 3.9   < > 5.3* 4.8 3.6  4.3 4.1 4.1  CL 108 112* 112*   < > 111 110   < > 113*   < >  115* 114*  --   --  109  CO2 26 27 25    < > 15* 15*   < > 16*   < > 21* 19*  --   --  22  GLUCOSE 99 95 86   < > 100* 84   < > 95   < > 97 116*  --   --  98  BUN 28* 26* 20   < > 51* 67*   < > 57*   < > 36* 39*  --   --  23  CREATININE 2.13* 1.86* 1.82*   < > 2.42* 2.46*   < > 2.44*   < > 2.09* 2.65*  --   --  2.14*  CALCIUM 8.5* 8.1* 8.4*   < > 7.3* 7.6*   < > 9.0   < > 8.8* 9.0  --   --  8.5*  MG 1.9 1.8 1.8  --  1.0* 1.0*  --  1.6*  --   --   --   --   --   --   PHOS 3.0 3.1 3.0  --   --   --   --   --   --   --   --   --   --   --    < > = values in this interval not displayed.   Recent Labs    09/02/18 0514 09/08/18 0248 09/09/18 0443  AST 21 13* 12*  ALT 20 11 10   ALKPHOS 70 61 50  BILITOT 0.2* 0.5 0.4  PROT 7.6 6.7 6.3*  ALBUMIN 3.6 3.0* 2.7*   Recent Labs    08/31/18 0134 09/02/18 0514  09/07/18 1708 09/08/18 0248  10/12/18 1230  10/12/18 1555 11/08/18 1005 12/08/18 1035  WBC 12.3* 11.0*  --  10.8* 9.0  --  10.6*  --   --   --   --   NEUTROABS 10.3* 6.6  --   --  5.4  --   --   --   --   --   --   HGB 9.7* 10.9*   < > 11.0* 10.7*   < > 9.5*   < > 8.5* 9.7* 10.5*  HCT 31.5* 35.3*   < > 35.8* 33.6*  32.0*   < > 31.3*   < > 25.0* 32.0*  34.4*  MCV 109.4* 106.3*  --  108.5* 104.7*  --  114.2*  --   --   --   --   PLT 199 247  --  237 201  --  202  --   --   --   --    < > = values in this interval not displayed.   Lab Results  Component Value Date   TSH 0.744 09/08/2018   Lab Results  Component Value Date   HGBA1C 4.9 08/10/2015   Lab Results  Component Value Date   CHOL 138 05/04/2017   HDL 42 05/04/2017   LDLCALC 71 05/04/2017   TRIG 176 (H) 05/04/2017   CHOLHDL 3.3 05/04/2017    Significant Diagnostic Results in last 30 days:  No results found.  Assessment/Plan 1. Chronic diarrhea Afebrile.Had x 2 episode today and one episode yesterday.Continue on Loperamide 4 mg capsule as needed.Increase fluid intake.  - Ambulatory referral to  Gastroenterology  2. Crohn's disease of small intestine without complication ([place]) Not seen by GI for several years. - Ambulatory referral to Gastroenterology  Family/ staff Communication: Reviewed plan of care with patient.   Labs/tests ordered: None   Spent 11 minutes of non-face to face with patient    Sandrea Hughs, NP

## 2019-01-04 NOTE — Progress Notes (Signed)
Paramedicine Encounter    Patient ID: Brent [name], male    DOB: 1945-07-16, 73 y.o.   MRN: [id number]    Patient Care Team: Gayland Curry, DO as PCP - General (Geriatric Medicine) Martinique, Peter M, MD as PCP - Cardiology (Cardiology) Larey Dresser, MD as PCP - Advanced Heart Failure (Cardiology) Elmarie Shiley, MD (Nephrology) Milus Banister, MD (Gastroenterology)  Patient Active Problem List   Diagnosis Date Noted  . Metabolic acidosis 45/62/5638  . Orthostasis 09/07/2018  . AKI (acute kidney injury) ([place])   . Immunosuppressed status ([place])   . Macrocytic anemia   . Chronic combined systolic and diastolic congestive heart failure ([place]) 06/03/2018  . Candida esophagitis ([place]) 09/22/2017  . History of smoking 30 or more pack years 01/05/2017  . Bipolar 1 disorder ([place])   . BPH (benign prostatic hyperplasia) 03/31/2013  . 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 Essential hypertension, benign 03/31/2013  . Acute renal failure superimposed on stage 3 chronic kidney disease ([place]) 06/04/2012  . 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[place]) 01/23/2010    Current Outpatient Medications:  .  allopurinol (ZYLOPRIM) 100 MG tablet, Take 2 tablets (200 mg total) by mouth daily., Disp: 60 tablet, Rfl: 5 .  ferrous sulfate 325 (65 FE) MG tablet, Take 325 mg by mouth every Monday, Wednesday, and Friday. , Disp: , Rfl:  .  furosemide (LASIX) 20 MG tablet, Take 1 tablet (20 mg total) by mouth every other day., Disp: 45 tablet, Rfl: 3 .  hydrALAZINE (APRESOLINE) 10 MG tablet, TAKE 1 TABLET(10 MG) BY MOUTH TWICE DAILY, Disp: 180 tablet, Rfl: 3 .  isosorbide mononitrate (IMDUR) 30 MG 24 hr tablet, Take 1 tablet (30 mg total) by mouth daily., Disp: 90 tablet, Rfl: 3 .  ivabradine (CORLANOR) 5 MG TABS tablet, Take 0.5 tablets (2.5 mg total) by mouth 2 (two) times daily with a meal., Disp: 180 tablet, Rfl: 3 .  loperamide (IMODIUM) 2 MG capsule, Take 4 mg by mouth daily as needed for diarrhea or loose  stools., Disp: , Rfl:  .  magnesium gluconate (MAGONATE) 30 MG tablet, Take 1 tablet (30 mg total) by mouth every Monday, Wednesday, and Friday., Disp: 10 tablet, Rfl: 0 .  metoprolol succinate (TOPROL-XL) 25 MG 24 hr tablet, Take 1 tablet (25 mg total) by mouth at bedtime., Disp: 90 tablet, Rfl: 3 .  OLANZapine (ZYPREXA) 7.5 MG tablet, Take 1 tablet (7.5 mg total) by mouth at bedtime., Disp: 30 tablet, Rfl: 11 .  omeprazole (PRILOSEC) 20 MG capsule, Take 20 mg by mouth daily before supper.  , Disp: , Rfl:  .  cholecalciferol (VITAMIN D) 1000 units tablet, Take 5,000 Units by mouth daily. , Disp: , Rfl:  .  divalproex (DEPAKOTE) 500 MG EC tablet, Take 1,500 mg by mouth at bedtime. , Disp: , Rfl:  .  epoetin alfa (PROCRIT) 93734 UNIT/ML injection, Inject 10,000 Units into the skin every 30 (thirty) days. , Disp: , Rfl:  .  inFLIXimab (REMICADE) 100 MG injection, Infuse Remicade IV schedule 1 60m/kg every 8 weeks Premedicate with Tylenol 500-6526mby mouth and Benadryl 25-507my mouth prior to infusion. Last PPD was on 12/2009. , Disp: 1 each, Rfl: 6 .  polyethylene glycol (MIRALAX / GLYCOLAX) packet, Take 17 g by mouth daily as needed for mild constipation., Disp: 14 each, Rfl: 0 .  tamsulosin (FLOMAX) 0.4 MG CAPS capsule, Take 0.4 mg by mouth daily., Disp: , Rfl:  Allergies  Allergen Reactions  .  Azathioprine Other (See Comments)    REACTION: affected WBC "Almost died"  . Ciprofloxacin Other (See Comments)    Unknown rxn  . Levaquin [Levofloxacin In D5w] Other (See Comments)    Unknown rxn  . Plendil [Felodipine] Other (See Comments)    Unknown rxn     Social History   Socioeconomic History  . Marital status: Divorced    Spouse name: Not on file  . Number of children: 1  . Years of education: Not on file  . Highest education level: Not on file  Occupational History  . Occupation: retired  . Occupation: [employer]  . Financial resource strain: Not hard at all  . Food  insecurity    Worry: Never true    Inability: Never true  . Transportation needs    Medical: No    Non-medical: No  Tobacco Use  . Smoking status: Former Smoker    Packs/day: 1.00    Years: 49.00    Pack years: 49.00    Types: Cigarettes    Start date: 04/11/1956    Quit date: 04/17/2014    Years since quitting: 4.7  . Smokeless tobacco: Never Used  Substance and Sexual Activity  . Alcohol use: No    Alcohol/week: 0.0 standard drinks  . Drug use: No  . Sexual activity: Never  Lifestyle  . Physical activity    Days per week: 0 days    Minutes per session: 0 min  . Stress: Not at all  Relationships  . Social connections    Talks on phone: More than three times a week    Gets together: Three times a week    Attends religious service: More than 4 times per year    Active member of club or organization: Yes    Attends meetings of clubs or organizations: 1 to 4 times per year    Relationship status: Divorced  . Intimate partner violence    Fear of current or ex partner: No    Emotionally abused: No    Physically abused: No    Forced sexual activity: No  Other Topics Concern  . Not on file  Social History Narrative  . Not on file    Physical Exam Pulmonary:     Effort: Pulmonary effort is normal. No respiratory distress.     Breath sounds: No wheezing or rales.  Musculoskeletal:     Right lower leg: No edema.     Left lower leg: No edema.  Skin:    General: Skin is warm and dry.         Future Appointments  Date Time Provider [place]  01/07/2019 10:00 AM [facility] [place] [facility] None  01/12/2019 10:30 AM [facility] PA/NP [facility] None  01/19/2019  1:30 PM [name], [name], NP [facility] None  02/14/2019  2:15 PM Reed, Tiffany L, DO [facility] None     BP 118/80 (BP Location: Right Arm, Patient Position: Sitting, Cuff Size: Normal)   Pulse 76   Temp 98.1 F (36.7 C)   Wt 169 lb 12.8 oz (77 kg)   SpO2 98%   BMI 20.67 kg/m   Weight yesterday-168 Last  visit weight-167  ATF pt CAO x4 sitting in the living room watching tv.  He said that he's had diarrhea since yesterday; he think that he's having a IBS flare.  He has some abdominal discomfort, no nausea or vomiting.  He denies sob, chest pain and dizziness. He's taken all of his medications for  the week.  rx bottles verified and pill box refilled.   Basilia Jumbo and missy Rico Junker [phone]  Here's a list of medications that are not documented in epic, that the [place] has prescribed:  Medication ordered: Cyanocobalamin 1014mg  Olanzapine 7.5 mg  Divalproex 500 mg   Ishani Goldwasser, EMT Paramedic [phone]/11/2018    ACTION: Home visit completed

## 2019-01-04 NOTE — Telephone Encounter (Signed)
Called patient. Patient states he now ready to schedule. He has been having phone issues. Patient scheduled for today.

## 2019-01-04 NOTE — Patient Instructions (Signed)

## 2019-01-05 ENCOUNTER — Other Ambulatory Visit: Payer: Self-pay

## 2019-01-05 MED ORDER — ALLOPURINOL 100 MG PO TABS: 200.0000 mg | ORAL_TABLET | Freq: Every day | ORAL | 5 refills | Status: DC

## 2019-01-07 ENCOUNTER — Telehealth: Payer: Self-pay

## 2019-01-07 ENCOUNTER — Other Ambulatory Visit: Payer: Self-pay

## 2019-01-07 ENCOUNTER — Ambulatory Visit (HOSPITAL_COMMUNITY)
Admission: RE | Admit: 2019-01-07 | Discharge: 2019-01-07 | Disposition: A | Discharge status: Home / Self Care | Payer: Medicare Other | Source: Ambulatory Visit | Attending: Nephrology | Admitting: Nephrology

## 2019-01-07 DIAGNOSIS — D631 Anemia in chronic kidney disease: Secondary | ICD-10-CM | POA: Diagnosis not present

## 2019-01-07 DIAGNOSIS — N189 Chronic kidney disease, unspecified: Secondary | ICD-10-CM | POA: Diagnosis not present

## 2019-01-07 DIAGNOSIS — I5042 Chronic combined systolic (congestive) and diastolic (congestive) heart failure: Secondary | ICD-10-CM

## 2019-01-07 DIAGNOSIS — N183 Chronic kidney disease, stage 3 unspecified: Secondary | ICD-10-CM | POA: Diagnosis present

## 2019-01-07 LAB — IRON AND TIBC
Iron: 79 ug/dL (ref 45–182)
Saturation Ratios: 35 % (ref 17.9–39.5)
TIBC: 225 ug/dL — ABNORMAL LOW (ref 250–450)
UIBC: 146 ug/dL

## 2019-01-07 LAB — FERRITIN: Ferritin: 675 ng/mL — ABNORMAL HIGH (ref 24–336)

## 2019-01-07 LAB — HEMOGLOBIN AND HEMATOCRIT, BLOOD
HCT: 34.6 % — ABNORMAL LOW (ref 39.0–52.0)
Hemoglobin: 10.4 g/dL — ABNORMAL LOW (ref 13.0–17.0)

## 2019-01-07 MED ORDER — EPOETIN ALFA 10000 UNIT/ML IJ SOLN
10000.0000 [IU] | INTRAMUSCULAR | Status: DC
  Administered 2019-01-07: 11:00:00 10000 [IU] via SUBCUTANEOUS
  Filled 2019-01-07: qty 1

## 2019-01-07 NOTE — Discharge Instructions (Signed)

## 2019-01-07 NOTE — Telephone Encounter (Signed)
Dawn RN with [employer] called today and stated patient has been recently seen by her at the chronic heart failure program. Patient stated to her his interest for a referral to cardiac rehab program and did not have preference of location. Nurse went on to say patient reported GI issues had resolved and was believed to be brought on by cheerios and milk. Patient states he will still follow up with GI referral at [facility]. Nurse also noted patient's depression had gotten much better, and patient's eagerness to start cardiac rehab program. Routing to patient's provider.

## 2019-01-07 NOTE — Telephone Encounter (Signed)
I am so happy to hear this information.

## 2019-01-07 NOTE — Progress Notes (Signed)
PATIENT CARE CENTER NOTE  Diagnosis:Anemia associated with Chronic Renal Failure, anemia associated with renal disease   Provider:Patel, Ulice Dash MD   Procedure:Epoetin Alfa (Procrit) injection   Note:Patient received sub-qProcritinjection inleft arm. Tolerated well. Labs drawn pre-injection and Hemoglobin was 10.4. Patient BP was123/73.Discharge instructions given. Patient alert, oriented and ambulatoryat discharge.

## 2019-01-10 DIAGNOSIS — L02611 Cutaneous abscess of right foot: Secondary | ICD-10-CM | POA: Diagnosis not present

## 2019-01-10 DIAGNOSIS — L84 Corns and callosities: Secondary | ICD-10-CM | POA: Diagnosis not present

## 2019-01-10 DIAGNOSIS — M2042 Other hammer toe(s) (acquired), left foot: Secondary | ICD-10-CM | POA: Diagnosis not present

## 2019-01-11 ENCOUNTER — Other Ambulatory Visit (HOSPITAL_COMMUNITY): Payer: Self-pay

## 2019-01-11 NOTE — Telephone Encounter (Signed)
Referral was placed.  I would have though cardiology would be placing cardiac rehab referrals, but I'm happy to.  He's made a lot of progress, but would benefit from ongoing therapy.

## 2019-01-11 NOTE — Telephone Encounter (Signed)
Patient would like referral to be placed to cardiac rehab program and did not have preference of location. Routing to provider. Reached out to [facility] again to clarify referral needed to be placed and she stated yes.

## 2019-01-12 ENCOUNTER — Encounter (HOSPITAL_COMMUNITY): Payer: Self-pay | Admitting: *Deleted

## 2019-01-12 ENCOUNTER — Encounter (HOSPITAL_COMMUNITY): Payer: Medicare Other

## 2019-01-12 DIAGNOSIS — K50918 Crohn's disease, unspecified, with other complication: Secondary | ICD-10-CM | POA: Diagnosis not present

## 2019-01-12 NOTE — Progress Notes (Signed)
Received office referral from Dr. Mariea Clonts for this pt to participate in Cardiac Rehab with the diagnosis of Combined Systolic and Diastolic Heart Failure. Per request of pt [facility] Medicare Nurse.  Pt is also seen by Dr. [name] (HF).  Last seen on 10/7. Clinical review of pt follow up appt on 11/10 with Marlowe Sax NP with Dr. Mariea Clonts office note. Pt is making the expected progress in recovery.  Pt appropriate for scheduling for on site cardiac rehab and/or enrollment in Virtual Cardiac Rehab.  Pt Covid score is 7.  Will forward to staff for follow up. Cherre Huger, BSN Cardiac and Training and development officer

## 2019-01-12 NOTE — Progress Notes (Signed)
Paramedicine Encounter    Patient ID: Brent [name], male    DOB: 1945/06/29, 73 y.o.   MRN: [id number]    Patient Care Team: Gayland Curry, DO as PCP - General (Geriatric Medicine) Martinique, Peter M, MD as PCP - Cardiology (Cardiology) Larey Dresser, MD as PCP - Advanced Heart Failure (Cardiology) Elmarie Shiley, MD (Nephrology) Milus Banister, MD (Gastroenterology)  Patient Active Problem List   Diagnosis Date Noted  . Metabolic acidosis 17/71/1657  . Orthostasis 09/07/2018  . AKI (acute kidney injury) ([place])   . Immunosuppressed status ([place])   . Macrocytic anemia   . Chronic combined systolic and diastolic congestive heart failure ([place]) 06/03/2018  . Candida esophagitis ([place]) 09/22/2017  . History of smoking 30 or more pack years 01/05/2017  . Bipolar 1 disorder ([place])   . BPH (benign prostatic hyperplasia) 03/31/2013  . 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 Essential hypertension, benign 03/31/2013  . Acute renal failure superimposed on stage 3 chronic kidney disease ([place]) 06/04/2012  . 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[place]) 01/23/2010    Current Outpatient Medications:  .  allopurinol (ZYLOPRIM) 100 MG tablet, Take 2 tablets (200 mg total) by mouth daily., Disp: 60 tablet, Rfl: 5 .  cholecalciferol (VITAMIN D) 1000 units tablet, Take 5,000 Units by mouth daily. , Disp: , Rfl:  .  divalproex (DEPAKOTE) 500 MG EC tablet, Take 1,500 mg by mouth at bedtime. , Disp: , Rfl:  .  epoetin alfa (PROCRIT) 90383 UNIT/ML injection, Inject 10,000 Units into the skin every 30 (thirty) days. , Disp: , Rfl:  .  ferrous sulfate 325 (65 FE) MG tablet, Take 325 mg by mouth every Monday, Wednesday, and Friday. , Disp: , Rfl:  .  furosemide (LASIX) 20 MG tablet, Take 1 tablet (20 mg total) by mouth every other day., Disp: 45 tablet, Rfl: 3 .  hydrALAZINE (APRESOLINE) 10 MG tablet, TAKE 1 TABLET(10 MG) BY MOUTH TWICE DAILY, Disp: 180 tablet, Rfl: 3 .  inFLIXimab (REMICADE) 100 MG injection,  Infuse Remicade IV schedule 1 20m/kg every 8 weeks Premedicate with Tylenol 500-6524mby mouth and Benadryl 25-5039my mouth prior to infusion. Last PPD was on 12/2009. , Disp: 1 each, Rfl: 6 .  isosorbide mononitrate (IMDUR) 30 MG 24 hr tablet, Take 1 tablet (30 mg total) by mouth daily., Disp: 90 tablet, Rfl: 3 .  ivabradine (CORLANOR) 5 MG TABS tablet, Take 0.5 tablets (2.5 mg total) by mouth 2 (two) times daily with a meal., Disp: 180 tablet, Rfl: 3 .  loperamide (IMODIUM) 2 MG capsule, Take 4 mg by mouth daily as needed for diarrhea or loose stools., Disp: , Rfl:  .  magnesium gluconate (MAGONATE) 30 MG tablet, Take 1 tablet (30 mg total) by mouth every Monday, Wednesday, and Friday., Disp: 10 tablet, Rfl: 0 .  metoprolol succinate (TOPROL-XL) 25 MG 24 hr tablet, Take 1 tablet (25 mg total) by mouth at bedtime., Disp: 90 tablet, Rfl: 3 .  OLANZapine (ZYPREXA) 7.5 MG tablet, Take 1 tablet (7.5 mg total) by mouth at bedtime., Disp: 30 tablet, Rfl: 11 .  omeprazole (PRILOSEC) 20 MG capsule, Take 20 mg by mouth daily before supper.  , Disp: , Rfl:  .  polyethylene glycol (MIRALAX / GLYCOLAX) packet, Take 17 g by mouth daily as needed for mild constipation., Disp: 14 each, Rfl: 0 .  tamsulosin (FLOMAX) 0.4 MG CAPS capsule, Take 0.4 mg by mouth daily., Disp: , Rfl:  Allergies  Allergen Reactions  .  Azathioprine Other (See Comments)    REACTION: affected WBC "Almost died"  . Ciprofloxacin Other (See Comments)    Unknown rxn  . Levaquin [Levofloxacin In D5w] Other (See Comments)    Unknown rxn  . Plendil [Felodipine] Other (See Comments)    Unknown rxn     Social History   Socioeconomic History  . Marital status: Divorced    Spouse name: Not on file  . Number of children: 1  . Years of education: Not on file  . Highest education level: Not on file  Occupational History  . Occupation: retired  . Occupation: [employer]  . Financial resource strain: Not hard at all  . Food  insecurity    Worry: Never true    Inability: Never true  . Transportation needs    Medical: No    Non-medical: No  Tobacco Use  . Smoking status: Former Smoker    Packs/day: 1.00    Years: 49.00    Pack years: 49.00    Types: Cigarettes    Start date: 04/11/1956    Quit date: 04/17/2014    Years since quitting: 4.7  . Smokeless tobacco: Never Used  Substance and Sexual Activity  . Alcohol use: No    Alcohol/week: 0.0 standard drinks  . Drug use: No  . Sexual activity: Never  Lifestyle  . Physical activity    Days per week: 0 days    Minutes per session: 0 min  . Stress: Not at all  Relationships  . Social connections    Talks on phone: More than three times a week    Gets together: Three times a week    Attends religious service: More than 4 times per year    Active member of club or organization: Yes    Attends meetings of clubs or organizations: 1 to 4 times per year    Relationship status: Divorced  . Intimate partner violence    Fear of current or ex partner: No    Emotionally abused: No    Physically abused: No    Forced sexual activity: No  Other Topics Concern  . Not on file  Social History Narrative  . Not on file    Physical Exam Pulmonary:     Effort: Pulmonary effort is normal. No respiratory distress.     Breath sounds: No wheezing.  Musculoskeletal:     Right lower leg: No edema.     Left lower leg: No edema.  Skin:    General: Skin is warm and dry.         Future Appointments  Date Time Provider [place]  01/13/2019  9:30 AM [facility] PA/NP [facility] None  01/19/2019  1:30 PM [name], [name], NP [facility] None  02/07/2019  9:00 AM [facility] RM 1 [facility] None  02/14/2019  2:15 PM Reed, Tiffany L, DO [facility] None     BP 118/60 (BP Location: Right Arm, Patient Position: Sitting, Cuff Size: Normal)   Temp 98.9 F (37.2 C)   Wt 167 lb (75.8 kg)   SpO2 98%   BMI 20.33 kg/m   Weight yesterday-167 Last visit weight-169  ATF pt  CAO x4 sitting in the living room with no complaints. He had his right "big" toe nail taken off yesterday.  He asked me to take the bandage off, so he could start soaking it.  He denies pain and has taken his medications today.  He denies sob, dizziness and nausea.    He  said that he has an appointment with his pcp, coming up soon. He's still weighing daily and sending the results in to [place] [facility]. rx bottles verified and pill box refilled.    Medication ordered:  None  Brent [name], EMT Paramedic [phone] 01/12/2019    ACTION: Home visit completed

## 2019-01-13 ENCOUNTER — Encounter (HOSPITAL_COMMUNITY): Payer: Medicare Other

## 2019-01-17 ENCOUNTER — Encounter (HOSPITAL_COMMUNITY): Payer: Self-pay

## 2019-01-17 ENCOUNTER — Ambulatory Visit (HOSPITAL_COMMUNITY)
Admission: RE | Admit: 2019-01-17 | Discharge: 2019-01-17 | Disposition: A | Discharge status: Home / Self Care | Payer: Medicare Other | Source: Ambulatory Visit | Attending: Cardiology | Admitting: Cardiology

## 2019-01-17 ENCOUNTER — Other Ambulatory Visit: Payer: Self-pay

## 2019-01-17 VITALS — BP 124/70 | HR 85 | Wt 168.6 lb

## 2019-01-17 DIAGNOSIS — N183 Chronic kidney disease, stage 3 unspecified: Secondary | ICD-10-CM | POA: Diagnosis not present

## 2019-01-17 DIAGNOSIS — J439 Emphysema, unspecified: Secondary | ICD-10-CM | POA: Diagnosis not present

## 2019-01-17 DIAGNOSIS — M109 Gout, unspecified: Secondary | ICD-10-CM | POA: Insufficient documentation

## 2019-01-17 DIAGNOSIS — F319 Bipolar disorder, unspecified: Secondary | ICD-10-CM | POA: Insufficient documentation

## 2019-01-17 DIAGNOSIS — I5022 Chronic systolic (congestive) heart failure: Secondary | ICD-10-CM | POA: Diagnosis not present

## 2019-01-17 DIAGNOSIS — Z79899 Other long term (current) drug therapy: Secondary | ICD-10-CM | POA: Diagnosis not present

## 2019-01-17 DIAGNOSIS — I5042 Chronic combined systolic (congestive) and diastolic (congestive) heart failure: Secondary | ICD-10-CM | POA: Diagnosis not present

## 2019-01-17 DIAGNOSIS — Z833 Family history of diabetes mellitus: Secondary | ICD-10-CM | POA: Diagnosis not present

## 2019-01-17 DIAGNOSIS — K900 Celiac disease: Secondary | ICD-10-CM | POA: Insufficient documentation

## 2019-01-17 DIAGNOSIS — Z87891 Personal history of nicotine dependence: Secondary | ICD-10-CM | POA: Diagnosis not present

## 2019-01-17 DIAGNOSIS — I429 Cardiomyopathy, unspecified: Secondary | ICD-10-CM | POA: Diagnosis not present

## 2019-01-17 DIAGNOSIS — Z825 Family history of asthma and other chronic lower respiratory diseases: Secondary | ICD-10-CM | POA: Insufficient documentation

## 2019-01-17 DIAGNOSIS — R7989 Other specified abnormal findings of blood chemistry: Secondary | ICD-10-CM | POA: Diagnosis not present

## 2019-01-17 DIAGNOSIS — K5090 Crohn's disease, unspecified, without complications: Secondary | ICD-10-CM | POA: Insufficient documentation

## 2019-01-17 DIAGNOSIS — I130 Hypertensive heart and chronic kidney disease with heart failure and stage 1 through stage 4 chronic kidney disease, or unspecified chronic kidney disease: Secondary | ICD-10-CM | POA: Insufficient documentation

## 2019-01-17 LAB — BASIC METABOLIC PANEL
Anion gap: 9 (ref 5–15)
BUN: 20 mg/dL (ref 8–23)
CO2: 24 mmol/L (ref 22–32)
Calcium: 8.6 mg/dL — ABNORMAL LOW (ref 8.9–10.3)
Chloride: 111 mmol/L (ref 98–111)
Creatinine, Ser: 2.23 mg/dL — ABNORMAL HIGH (ref 0.61–1.24)
GFR calc Af Amer: 33 mL/min — ABNORMAL LOW (ref >60)
GFR calc non Af Amer: 28 mL/min — ABNORMAL LOW (ref >60)
Glucose, Bld: 94 mg/dL (ref 70–99)
Potassium: 3.4 mmol/L — ABNORMAL LOW (ref 3.5–5.1)
Sodium: 144 mmol/L (ref 135–145)

## 2019-01-17 MED ORDER — HYDRALAZINE HCL 25 MG PO TABS: 25.0000 mg | ORAL_TABLET | Freq: Two times a day (BID) | ORAL | 3 refills | Status: DC

## 2019-01-17 MED ORDER — IVABRADINE HCL 5 MG PO TABS: 5.0000 mg | ORAL_TABLET | Freq: Two times a day (BID) | ORAL | 3 refills | Status: DC

## 2019-01-17 NOTE — Progress Notes (Signed)
PCP: Gayland Curry, DO Cardiology: Dr. Leandro Reasoner Brent [name] is a 73 y.o. with a history of Crohn's disease, celiac disease,HTN, COPD,priortobacco use, CKD 3, chronic anemia, and bipolar disorder.Diagnosed with systolic HF in 8/84 (echo with EF 30-35%).   Admitted 3/11-3/16/20 with SOB and hypoxia. Found to have PNA and required BIPAP and antibiotics. Blood cultures were negative. Echo showed newly reduced EF 30-35%. Cardiology consulted and he had nuclear stress test, which was read as "high risk" due to low EF. Per Dr Sallyanne Kuster, did not think changes were indicative of coronary disease and cath was not pursued due this and due to CKD. HF meds optimized. Limited with CKD3. He had one short run of NSVT. Also had AKI on CKD3, but creatinine improved to baseline 1.77 on day of discharge.  He was seen in ED 05/12/18 with SOB after misplacing his nebulizer. BNP was elevated, so he was given 40 mg IV lasix and discharged with lasix 20 mg BID. Referred to HF clinic.  He was admitted again in 7/20 with weakness, orthostatic hypotension, and AKI.  Cardiac meds were held.  He was noted to be severely deconditioned, and he was discharged to SNF.  He remained in SNF x 2 wks before being discharged home.   Echo was done in 8/20, EF remained low at 20-25% with mild LVH and normal RV.  He had RHC in 8/20 showing normal filling pressures and good cardiac output (surprisingly). At last OV 11/2018, Corlanor was added and wt was stable at 169 lb. Compliant w/ meds and followed by HF Paramedicine.   He presents to clinic today for routine f/u. Reports that he is doing well. No changes since last OV. Remains NYHA class III. No symptoms at rest. Denies orthopnea/PND. No LEE. Wt has remained stable at 168 lb. Followed closely by [facility] and Paramedicine. Fully compliant w/ meds. No side effects w/ corlanor. Denies visual changes and no palpitations. Reports good UOP on current lasix dose. BP 124/70. HR  85 bpm    Labs (7/20): K 5.1, creatinine 2.25 Labs (8/20): K 4.8, creatinine 2.65 Labs (9/20): hgb 9.7  Labs 11/10/18: K 4.1 Creatinine 2.14   ECG: not performed today. RRR on exam   PMH: 1. Crohns disease: Gets Remicade.  2. Celiac disease.  3. COPD: Prior smoker.  - CT chest (7/20) with mild emphysema.  4. CKD: Stage 3.  5. HTN 6. Bipolar disorder 7. H/o BPPV 8. Gout 9. Chronic systolic CHF: Etiology uncertain.  - Echo (3/20): EF 30-35%, mild LVH, normal RV.  - Cardiolite (3/20): EF 28%, no ischemia, fixed defects in anteroseptal and inferolateral walls.  - Echo (8/20): EF 20-25%, diffuse hypokinesis, mild LVH, normal RV size and systolic function, normal IVC size.  - RHC (8/20): mean RA 3, PA 21/10, mean PCWP 5, CI 3.2 Fick and 3.5 Thermo  Social History   Socioeconomic History  . Marital status: Divorced    Spouse name: Not on file  . Number of children: 1  . Years of education: Not on file  . Highest education level: Not on file  Occupational History  . Occupation: retired  . Occupation: [employer]  . Financial resource strain: Not hard at all  . Food insecurity    Worry: Never true    Inability: Never true  . Transportation needs    Medical: No    Non-medical: No  Tobacco Use  . Smoking status: Former Smoker  Packs/day: 1.00    Years: 49.00    Pack years: 49.00    Types: Cigarettes    Start date: 04/11/1956    Quit date: 04/17/2014    Years since quitting: 4.7  . Smokeless tobacco: Never Used  Substance and Sexual Activity  . Alcohol use: No    Alcohol/week: 0.0 standard drinks  . Drug use: No  . Sexual activity: Never  Lifestyle  . Physical activity    Days per week: 0 days    Minutes per session: 0 min  . Stress: Not at all  Relationships  . Social connections    Talks on phone: More than three times a week    Gets together: Three times a week    Attends religious service: More than 4 times per year    Active member of club or  organization: Yes    Attends meetings of clubs or organizations: 1 to 4 times per year    Relationship status: Divorced  . Intimate partner violence    Fear of current or ex partner: No    Emotionally abused: No    Physically abused: No    Forced sexual activity: No  Other Topics Concern  . Not on file  Social History Narrative  . Not on file   Family History  Problem Relation Age of Onset  . Diabetes Mother        maternal grandmother  . Uterine cancer Mother   . Emphysema Father   . Pneumonia Maternal Grandmother   . Colon cancer Neg Hx    ROS: All systems reviewed and negative except as per HPI.   Current Outpatient Medications  Medication Sig Dispense Refill  . allopurinol (ZYLOPRIM) 100 MG tablet Take 2 tablets (200 mg total) by mouth daily. 60 tablet 5  . cholecalciferol (VITAMIN D) 1000 units tablet Take 5,000 Units by mouth daily.     . divalproex (DEPAKOTE) 500 MG EC tablet Take 1,500 mg by mouth at bedtime.     Marland Kitchen epoetin alfa (PROCRIT) 00923 UNIT/ML injection Inject 10,000 Units into the skin every 30 (thirty) days.     . ferrous sulfate 325 (65 FE) MG tablet Take 325 mg by mouth every Monday, Wednesday, and Friday.     . furosemide (LASIX) 20 MG tablet Take 1 tablet (20 mg total) by mouth every other day. 45 tablet 3  . hydrALAZINE (APRESOLINE) 10 MG tablet TAKE 1 TABLET(10 MG) BY MOUTH TWICE DAILY 180 tablet 3  . inFLIXimab (REMICADE) 100 MG injection Infuse Remicade IV schedule 1 85m/kg every 8 weeks Premedicate with Tylenol 500-6585mby mouth and Benadryl 25-5032my mouth prior to infusion. Last PPD was on 12/2009.  1 each 6  . isosorbide mononitrate (IMDUR) 30 MG 24 hr tablet Take 1 tablet (30 mg total) by mouth daily. 90 tablet 3  . ivabradine (CORLANOR) 5 MG TABS tablet Take 1 tablet (5 mg total) by mouth 2 (two) times daily with a meal. 180 tablet 3  . loperamide (IMODIUM) 2 MG capsule Take 4 mg by mouth daily as needed for diarrhea or loose stools.    .  magnesium gluconate (MAGONATE) 30 MG tablet Take 1 tablet (30 mg total) by mouth every Monday, Wednesday, and Friday. 10 tablet 0  . metoprolol succinate (TOPROL-XL) 25 MG 24 hr tablet Take 1 tablet (25 mg total) by mouth at bedtime. 90 tablet 3  . OLANZapine (ZYPREXA) 7.5 MG tablet Take 1 tablet (7.5 mg total) by mouth at bedtime.  30 tablet 11  . omeprazole (PRILOSEC) 20 MG capsule Take 20 mg by mouth daily before supper.      . polyethylene glycol (MIRALAX / GLYCOLAX) packet Take 17 g by mouth daily as needed for mild constipation. 14 each 0  . tamsulosin (FLOMAX) 0.4 MG CAPS capsule Take 0.4 mg by mouth daily.     No current facility-administered medications for this encounter.    BP 124/70   Pulse 85   Wt 76.5 kg (168 lb 9.6 oz)   SpO2 96%   BMI 20.52 kg/m  Wt Readings from Last 3 Encounters:  01/17/19 76.5 kg (168 lb 9.6 oz)  01/11/19 75.8 kg (167 lb)  01/04/19 77 kg (169 lb 12.8 oz)   PHYSICAL EXAM: General:  Elderly AM, Well appearing. No respiratory difficulty HEENT: normal Neck: supple. no JVD. Carotids 2+ bilat; no bruits. No lymphadenopathy or thyromegaly appreciated. Cor: PMI nondisplaced. Regular rate & rhythm. No rubs, gallops or murmurs. Lungs: clear Abdomen: soft, nontender, nondistended. No hepatosplenomegaly. No bruits or masses. Good bowel sounds. Extremities: no cyanosis, clubbing, rash, edema Neuro: alert & oriented x 3, cranial nerves grossly intact. moves all 4 extremities w/o difficulty. Affect pleasant.   Assessment/Plan: 1. Chronic systolic CHF: Diagnosed by echo in 3/20, EF 30-35%.  Cardiolite showed fixed defects but no ischemia.  Etiology of cardiomyopathy is uncertain, he has not had cath or MRI due to elevated creatinine. He has no chest pain.  Echo in 8/20 showed EF 20-25% with mild LVH and normal RV function.  RHC in 8/20 showed normal cardiac output and filling pressures.  Chronically NYHA Class III. Volume status/ weight stable. Euvolemic on exam.   - Continue lasix 20 mg every other day. Check BMP today - Increase hydralazine to 25 mg bid  - Continue Imdur 30 mg daily - Continue Toprol XL 25 mg daily, take in evening before bed.  - Given HR >70, will increase corlanor to 5 mg bid  - No ACEI/ARB/ARNI w/ elevated creatinine.  - If creatinine trends down < 2, would plan for coronary angiography. - He would be a difficult candidate for advanced therapies with deconditioning, renal dysfunction, and fact that he lives alone.   - Continue w/ paramedicine for medication assistance and [facility] for volume monitoring  2. CKD: Stage 3  Check BMET today   3. COPD: Mild emphysema on prior chest CT.   - breathing stable.      Follow up in 8 weeks with Dr [name].  Lyda Jester PA-C  01/17/2019

## 2019-01-17 NOTE — Patient Instructions (Addendum)
INCREASE Corlanor to 87m twice daily.  Increase Hydralazine to 231mtwice daily.  Routine lab work today. Will notify you of abnormal results  Follow up in 3 months with Dr.McLean

## 2019-01-18 ENCOUNTER — Emergency Department (HOSPITAL_COMMUNITY): Payer: Medicare Other

## 2019-01-18 ENCOUNTER — Emergency Department (HOSPITAL_COMMUNITY)
Admission: EM | Admit: 2019-01-18 | Discharge: 2019-01-18 | Disposition: A | Discharge status: Home / Self Care | Payer: Medicare Other | Attending: Emergency Medicine | Admitting: Emergency Medicine

## 2019-01-18 ENCOUNTER — Telehealth (HOSPITAL_COMMUNITY): Payer: Self-pay

## 2019-01-18 ENCOUNTER — Encounter: Payer: Self-pay | Admitting: Family

## 2019-01-18 ENCOUNTER — Encounter (HOSPITAL_COMMUNITY): Payer: Self-pay

## 2019-01-18 ENCOUNTER — Ambulatory Visit (INDEPENDENT_AMBULATORY_CARE_PROVIDER_SITE_OTHER): Payer: Medicare Other | Admitting: Family

## 2019-01-18 ENCOUNTER — Other Ambulatory Visit: Payer: Self-pay

## 2019-01-18 VITALS — BP 126/76 | HR 104

## 2019-01-18 DIAGNOSIS — I130 Hypertensive heart and chronic kidney disease with heart failure and stage 1 through stage 4 chronic kidney disease, or unspecified chronic kidney disease: Secondary | ICD-10-CM | POA: Diagnosis not present

## 2019-01-18 DIAGNOSIS — I5042 Chronic combined systolic (congestive) and diastolic (congestive) heart failure: Secondary | ICD-10-CM | POA: Diagnosis not present

## 2019-01-18 DIAGNOSIS — R432 Parageusia: Secondary | ICD-10-CM

## 2019-01-18 DIAGNOSIS — R630 Anorexia: Secondary | ICD-10-CM | POA: Diagnosis not present

## 2019-01-18 DIAGNOSIS — I951 Orthostatic hypotension: Secondary | ICD-10-CM | POA: Insufficient documentation

## 2019-01-18 DIAGNOSIS — R42 Dizziness and giddiness: Secondary | ICD-10-CM

## 2019-01-18 DIAGNOSIS — Z79899 Other long term (current) drug therapy: Secondary | ICD-10-CM | POA: Insufficient documentation

## 2019-01-18 DIAGNOSIS — K529 Noninfective gastroenteritis and colitis, unspecified: Secondary | ICD-10-CM

## 2019-01-18 DIAGNOSIS — E860 Dehydration: Secondary | ICD-10-CM | POA: Diagnosis not present

## 2019-01-18 DIAGNOSIS — Z87891 Personal history of nicotine dependence: Secondary | ICD-10-CM | POA: Insufficient documentation

## 2019-01-18 DIAGNOSIS — R197 Diarrhea, unspecified: Secondary | ICD-10-CM | POA: Diagnosis not present

## 2019-01-18 DIAGNOSIS — R531 Weakness: Secondary | ICD-10-CM

## 2019-01-18 DIAGNOSIS — Z20828 Contact with and (suspected) exposure to other viral communicable diseases: Secondary | ICD-10-CM | POA: Insufficient documentation

## 2019-01-18 DIAGNOSIS — R430 Anosmia: Secondary | ICD-10-CM

## 2019-01-18 DIAGNOSIS — E876 Hypokalemia: Secondary | ICD-10-CM | POA: Diagnosis not present

## 2019-01-18 DIAGNOSIS — N183 Chronic kidney disease, stage 3 unspecified: Secondary | ICD-10-CM | POA: Diagnosis not present

## 2019-01-18 DIAGNOSIS — R000 Tachycardia, unspecified: Secondary | ICD-10-CM

## 2019-01-18 DIAGNOSIS — J9811 Atelectasis: Secondary | ICD-10-CM | POA: Diagnosis not present

## 2019-01-18 HISTORY — DX: Heart failure, unspecified: I50.9

## 2019-01-18 LAB — URINALYSIS, ROUTINE W REFLEX MICROSCOPIC
Bilirubin Urine: NEGATIVE
Glucose, UA: NEGATIVE mg/dL
Hgb urine dipstick: NEGATIVE
Ketones, ur: NEGATIVE mg/dL
Leukocytes,Ua: NEGATIVE
Nitrite: NEGATIVE
Protein, ur: 30 mg/dL — AB
Specific Gravity, Urine: 1.021 (ref 1.005–1.030)
pH: 5 (ref 5.0–8.0)

## 2019-01-18 LAB — CBC
HCT: 34.6 % — ABNORMAL LOW (ref 39.0–52.0)
Hemoglobin: 10.8 g/dL — ABNORMAL LOW (ref 13.0–17.0)
MCH: 33.6 pg (ref 26.0–34.0)
MCHC: 31.2 g/dL (ref 30.0–36.0)
MCV: 107.8 fL — ABNORMAL HIGH (ref 80.0–100.0)
Platelets: 176 10*3/uL (ref 150–400)
RBC: 3.21 MIL/uL — ABNORMAL LOW (ref 4.22–5.81)
RDW: 13.4 % (ref 11.5–15.5)
WBC: 7.8 10*3/uL (ref 4.0–10.5)
nRBC: 0 % (ref 0.0–0.2)

## 2019-01-18 LAB — BASIC METABOLIC PANEL
Anion gap: 11 (ref 5–15)
BUN: 21 mg/dL (ref 8–23)
CO2: 21 mmol/L — ABNORMAL LOW (ref 22–32)
Calcium: 8.5 mg/dL — ABNORMAL LOW (ref 8.9–10.3)
Chloride: 111 mmol/L (ref 98–111)
Creatinine, Ser: 2.29 mg/dL — ABNORMAL HIGH (ref 0.61–1.24)
GFR calc Af Amer: 32 mL/min — ABNORMAL LOW (ref >60)
GFR calc non Af Amer: 27 mL/min — ABNORMAL LOW (ref >60)
Glucose, Bld: 101 mg/dL — ABNORMAL HIGH (ref 70–99)
Potassium: 3.3 mmol/L — ABNORMAL LOW (ref 3.5–5.1)
Sodium: 143 mmol/L (ref 135–145)

## 2019-01-18 LAB — TROPONIN I (HIGH SENSITIVITY): Troponin I (High Sensitivity): 17 ng/L (ref <18)

## 2019-01-18 LAB — CBG MONITORING, ED: Glucose-Capillary: 109 mg/dL — ABNORMAL HIGH (ref 70–99)

## 2019-01-18 LAB — SARS CORONAVIRUS 2 (TAT 6-24 HRS): SARS Coronavirus 2: NEGATIVE

## 2019-01-18 MED ORDER — POTASSIUM CHLORIDE CRYS ER 20 MEQ PO TBCR
20.0000 meq | EXTENDED_RELEASE_TABLET | Freq: Once | ORAL | Status: AC
  Administered 2019-01-18: 15:00:00 20 meq via ORAL
  Filled 2019-01-18: qty 1

## 2019-01-18 MED ORDER — SODIUM CHLORIDE 0.9 % IV BOLUS
250.0000 mL | Freq: Once | INTRAVENOUS | Status: AC
  Administered 2019-01-18: 250 mL via INTRAVENOUS

## 2019-01-18 MED ORDER — LOPERAMIDE HCL 2 MG PO CAPS: 2.0000 mg | ORAL_CAPSULE | Freq: Four times a day (QID) | ORAL | 0 refills | Status: DC | PRN

## 2019-01-18 NOTE — Discharge Instructions (Addendum)
Continue to take your medications as prescribed.  Drink plenty fluids and get plenty of rest. I have attached information about food choices that will help relieve diarrhea.  You can start taking Imodium as needed for diarrhea.  Follow-up with your gastroenterologist at the [facility] or your primary care provider for reevaluation of your symptoms.  Return to the emergency department if any concerning signs or symptoms develop such as loss of consciousness, shortness of breath or chest pains, persistent vomiting, or worsening diarrhea.

## 2019-01-18 NOTE — ED Provider Notes (Signed)
[facility] EMERGENCY DEPARTMENT Provider Note   CSN: [id number] Arrival date & time: 01/18/19  1204     History   Chief Complaint Chief Complaint  Patient presents with   Dizziness   Weakness    HPI Brent [name] is a 73 y.o. male with history of Crohn's disease, CHF with LVEF 20 to 25%, CKD, hypertension sent from gerontologist for evaluation of acute onset, progressively worsening diarrhea, generalized weakness, lightheadedness for 1 week.  Reports 3-4 episodes of nonbloody watery stools daily.  Denies abdominal pain, nausea, or vomiting.  Has felt shortness of breath at rest and with ambulation.  Denies lower extremity swelling.  Has been taking his home medications as prescribed.  Denies cough or fever.  No known sick contacts.  Reports he feels lightheaded and unsteady when he stands up to walk.  Has not tried anything for his symptoms.     The history is provided by the patient.    Past Medical History:  Diagnosis Date   Anemia    Anxiety    Benign paroxysmal positional vertigo    Bipolar I disorder, most recent episode (or current) unspecified    Celiac disease    CHF (congestive heart failure) (HCC)    Chronic kidney disease, stage III (moderate)    Crohn's    Remicade q8 weeks   Depression    Essential and other specified forms of tremor    medication-induced Parkinson's, now resolved   Essential hypertension, benign    Gout 2018   Heart failure ([place])    Hypertrophy of prostate without urinary obstruction and other lower urinary tract symptoms (LUTS)    Impotence of organic origin    Insomnia with sleep apnea, unspecified    Iron deficiency anemia, unspecified    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   Neuralgia, neuritis, and radiculitis, unspecified    Other B-complex deficiencies    Other extrapyramidal disease and abnormal movement disorder    Postinflammatory pulmonary fibrosis ([place])    Tobacco use disorder    Vertigo 2018     Patient Active Problem List   Diagnosis Date Noted   Metabolic acidosis 47/65/4650   Orthostasis 09/07/2018   AKI (acute kidney injury) ([place])    Immunosuppressed status ([place])    Macrocytic anemia    Chronic combined systolic and diastolic congestive heart failure ([place]) 06/03/2018   Candida esophagitis ([place]) 09/22/2017   History of smoking 30 or more pack years 01/05/2017   Bipolar 1 disorder (HCC)    BPH (benign prostatic hyperplasia) 03/31/2013   Anemia in chronic kidney disease 03/31/2013   Essential hypertension, benign 03/31/2013   Acute renal failure superimposed on stage 3 chronic kidney disease ([place]) 06/04/2012   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 Authorizing Provider  allopurinol (ZYLOPRIM) 100 MG tablet Take 2 tablets (200 mg total) by mouth daily. 01/05/19   Reed, Tiffany L, DO  cholecalciferol (VITAMIN D) 1000 units tablet Take 5,000 Units by mouth daily.  [provider]  divalproex (DEPAKOTE) 500 MG EC tablet Take 1,500 mg by mouth at bedtime.     [provider]  epoetin alfa (PROCRIT) 10272 UNIT/ML injection Inject 10,000 Units into the skin every 30 (thirty) days.     [provider]  ferrous sulfate 325 (65 FE) MG tablet Take 325 mg by mouth every Monday, Wednesday, and Friday.     [provider]  furosemide (LASIX) 20 MG tablet Take 1 tablet (20 mg total) by mouth every other day. 12/28/18   Bensimhon, Shaune Pascal, MD  hydrALAZINE (APRESOLINE) 25 MG  tablet Take 1 tablet (25 mg total) by mouth 2 (two) times daily. 01/17/19   Lyda Jester M, PA-C  inFLIXimab (REMICADE) 100 MG injection Infuse Remicade IV schedule 1 54m/kg every 8 weeks Premedicate with Tylenol 500-6521mby mouth and Benadryl 25-5050my mouth prior to infusion. Last PPD was on 12/2009.  11/22/10   JacMilus BanisterD  isosorbide mononitrate (IMDUR) 30 MG 24 hr tablet Take 1 tablet (30 mg total) by mouth daily. 12/28/18   Bensimhon, DanShaune PascalD  ivabradine (CORLANOR) 5 MG TABS tablet Take 1 tablet (5 mg total) by mouth 2 (two) times daily with a meal. 01/17/19   SimLyda Jester PA-C  loperamide (IMODIUM) 2 MG capsule Take 1 capsule (2 mg total) by mouth 4 (four) times daily as needed for diarrhea or loose stools. 01/18/19   FawNils Flackina A, PA-C  magnesium gluconate (MAGONATE) 30 MG tablet Take 1 tablet (30 mg total) by mouth every Monday, Wednesday, and Friday. 09/03/18   Xu,Florencia ReasonsD  metoprolol succinate (TOPROL-XL) 25 MG 24 hr tablet Take 1 tablet (25 mg total) by mouth at bedtime. 12/28/18   Bensimhon, DanShaune PascalD  OLANZapine (ZYPREXA) 7.5 MG tablet Take 1 tablet (7.5 mg total) by mouth at bedtime. 01/03/16   Reed, Tiffany L, DO  omeprazole (PRILOSEC) 20 MG capsule Take 20 mg by mouth daily before supper.      [provider]  tamsulosin (FLOMAX) 0.4 MG CAPS capsule Take 0.4 mg by mouth daily.    [provider]    Family History Family History  Problem Relation Age of Onset   Diabetes Mother        maternal grandmother   Uterine cancer Mother    Emphysema Father    Pneumonia Maternal Grandmother    Colon cancer Neg Hx     Social History Social History   Tobacco Use   Smoking status: Former Smoker    Packs/day: 1.00    Years: 49.00    Pack years: 49.00    Types: Cigarettes    Start date: 04/11/1956    Quit date: 04/17/2014    Years since quitting: 4.7   Smokeless tobacco: Never Used  Substance Use Topics   Alcohol use: No     Alcohol/week: 0.0 standard drinks   Drug use: No     Allergies   Azathioprine, Ciprofloxacin, Levaquin [levofloxacin in d5w], and Plendil [felodipine]   Review of Systems Review of Systems  Constitutional: Positive for fatigue. Negative for chills and fever.  Eyes: Negative for photophobia and visual disturbance.  Respiratory: Positive for shortness of breath. Negative for cough.   Cardiovascular: Negative for chest pain and leg swelling.  Gastrointestinal: Positive for diarrhea. Negative for abdominal pain, nausea and vomiting.  Neurological: Positive for weakness (Generalized) and light-headedness. Negative for numbness and headaches.  All other systems reviewed and are negative.  Physical Exam Updated Vital Signs BP (!) 156/94 (BP Location: Right Arm)    Pulse 84    Temp 98.4 F (36.9 C) (Oral)    Resp 18    SpO2 99%   Physical Exam Vitals signs and nursing note reviewed.  Constitutional:      General: He is not in acute distress.    Appearance: He is well-developed.  HENT:     Head: Normocephalic and atraumatic.     Mouth/Throat:     Mouth: Mucous membranes are dry.  Eyes:     General:        Right eye: No discharge.        Left eye: No discharge.     Conjunctiva/sclera: Conjunctivae normal.  Neck:     Musculoskeletal: Neck supple.     Vascular: No JVD.     Trachea: No tracheal deviation.  Cardiovascular:     Rate and Rhythm: Normal rate and regular rhythm.  Pulmonary:     Effort: Pulmonary effort is normal.     Breath sounds: Normal breath sounds.  Abdominal:     General: Abdomen is flat. Bowel sounds are normal. There is no distension.     Palpations: Abdomen is soft.     Tenderness: There is no abdominal tenderness. There is no guarding or rebound.  Skin:    General: Skin is warm and dry.     Findings: No erythema.  Neurological:     General: No focal deficit present.     Mental Status: He is alert and oriented to person, place, and time.      Cranial Nerves: No cranial nerve deficit.     Sensory: No sensory deficit.     Motor: No weakness.     Coordination: Coordination normal.     Gait: Gait normal.     Comments: Mental Status:  Alert, thought content appropriate, able to give a coherent history. Speech fluent without evidence of aphasia. Able to follow 2 step commands without difficulty.  Cranial Nerves:  II:  Peripheral visual fields grossly normal, pupils equal, round, reactive to light III,IV, VI: ptosis not present, extra-ocular motions intact bilaterally  V,VII: smile symmetric, facial light touch sensation equal VIII: hearing grossly normal to voice  X: uvula elevates symmetrically  XI: bilateral shoulder shrug symmetric and strong XII: midline tongue extension without fassiculations Motor:  Normal tone. 5/5 strength of BUE and BLE major muscle groups including strong and equal grip strength and dorsiflexion/plantar flexion Sensory: light touch normal in all extremities. Cerebellar: Romberg sign absent  gait: normal gait and balance. Able to walk on toes and heels with ease.     Psychiatric:        Behavior: Behavior normal.      ED Treatments / Results  Labs (all labs ordered are listed, but only abnormal results are displayed) Labs Reviewed  BASIC METABOLIC PANEL - Abnormal; Notable for the following components:      Result Value   Potassium 3.3 (*)    CO2 21 (*)    Glucose, Bld 101 (*)    Creatinine, Ser 2.29 (*)    Calcium 8.5 (*)    GFR calc non Af Amer 27 (*)    GFR calc Af Amer 32 (*)    All other components within normal limits  CBC - Abnormal; Notable for the following components:   RBC 3.21 (*)    Hemoglobin 10.8 (*)    HCT 34.6 (*)    MCV 107.8 (*)  All other components within normal limits  URINALYSIS, ROUTINE W REFLEX MICROSCOPIC - Abnormal; Notable for the following components:   Color, Urine AMBER (*)    Protein, ur 30 (*)    Bacteria, UA RARE (*)    All other components within  normal limits  CBG MONITORING, ED - Abnormal; Notable for the following components:   Glucose-Capillary 109 (*)    All other components within normal limits  SARS CORONAVIRUS 2 (TAT 6-24 HRS)  GASTROINTESTINAL PANEL BY PCR, STOOL (REPLACES STOOL CULTURE)  TROPONIN I (HIGH SENSITIVITY)    EKG EKG Interpretation  Date/Time:  Tuesday January 18 2019 12:07:21 EST Ventricular Rate:  88 PR Interval:  136 QRS Duration: 84 QT Interval:  360 QTC Calculation: 435 R Axis:   24 Text Interpretation: Normal sinus rhythm ST & T wave abnormality, consider lateral ischemia Abnormal ECG Confirmed by Lennice Sites [phone]) on 01/18/2019 1:02:58 PM   Radiology Dg Chest 2 View  Result Date: 01/18/2019 CLINICAL DATA:  Dizziness and weakness. EXAM: CHEST - 2 VIEW COMPARISON:  CT 12/03/2018.  Chest x-ray 09/07/2018. FINDINGS: Mediastinum and hilar structures normal. Cardiomegaly. No pulmonary venous congestion. Mild left base subsegmental atelectasis. Small left pleural effusion. No pneumothorax. IMPRESSION: 1.  Mild left base subsegmental atelectasis. 2.  Cardiomegaly.  No pulmonary venous congestion. Electronically Signed   By: Marcello Moores  Register   On: 01/18/2019 14:07    Procedures Procedures (including critical care time)  Medications Ordered in ED Medications  sodium chloride 0.9 % bolus 250 mL (0 mLs Intravenous Stopped 01/18/19 1511)  potassium chloride SA (KLOR-CON) CR tablet 20 mEq (20 mEq Oral Given 01/18/19 1522)     Initial Impression / Assessment and Plan / ED Course  I have reviewed the triage vital signs and the nursing notes.  Pertinent labs & imaging results that were available during my care of the patient were reviewed by me and considered in my medical decision making (see chart for details).        Brent [name] was evaluated in Emergency Department on 01/18/2019 for the symptoms described in the history of present illness. He was evaluated in the context of the global  COVID-19 pandemic, which necessitated consideration that the patient might be at risk for infection with the SARS-CoV-2 virus that causes COVID-19. Institutional protocols and algorithms that pertain to the evaluation of patients at risk for COVID-19 are in a state of rapid change based on information released by regulatory bodies including the CDC and federal and state organizations. These policies and algorithms were followed during the patient's care in the ED.  Patient presents for evaluation of diarrhea for 1 week with associated generalized weakness, lightheadedness with standing.  Was seen via telemedicine by his gerontologist earlier today and was tachycardic at that time was recommended for evaluation in the ED.  While in the ED he is afebrile and vital signs are stable; initial blood pressure is on the softer side.  He was found to be orthostatic.  We will give him a small fluid bolus given his history of CHF with poor EF.  He has a normal neurologic examination with no focal neurologic deficits.  Doubt CVA, vertebral artery dissection, posterior circulation stroke.  Chest x-ray shows subsegmental atelectasis of the left lung base and cardiomegaly but no evidence of pulmonary venous congestion.  He was ambulated in the ED with stable SPO2 saturations.  Doubt CHF exacerbation or PE at this time.  His abdomen is soft and nontender, doubt acute  surgical abdominal pathology.  He has no complaint of chest pain and a troponin was obtained which was within normal limits.  I do not feel that serial troponin testing is indicated given he has no history of chest pain.  Low suspicion of ACS/MI.  Lab work reviewed by me shows no leukocytosis, stable anemia stable renal insufficiency at patient's baseline.  He is mildly hypokalemic, will replenish orally in the ED.  UA does not suggest UTI or nephrolithiasis.  He was unable to provide stool sample in the ED.  I doubt C. difficile given no recent treatment with  antibiotics.  Will obtain outpatient Covid test.  On reevaluation the patient is resting comfortably no apparent distress, reports that he is feeling much better.  We discussed pushing fluids within reason, follow-up with PCP or gastroenterologist on outpatient basis for reevaluation.  He sees gastroenterology through the [place].  Discussed strict ED return precautions.  Patient verbalized understanding of and agreement with plan and patient stable for discharge home at this time.  Discussed plan of care with Dr. Ronnald Nian who agrees with assessment and plan at this time.  Final Clinical Impressions(s) / ED Diagnoses   Final diagnoses:  Orthostatic hypotension  Dehydration  Hypokalemia  Diarrhea, unspecified type    ED Discharge Orders         Ordered    loperamide (IMODIUM) 2 MG capsule  4 times daily PRN     01/18/19 1553           Renita Papa, PA-C 01/18/19 [place], [place], DO 01/19/19 [phone]

## 2019-01-18 NOTE — Progress Notes (Signed)
This service is provided via telemedicine  No vital signs collected/recorded due to the encounter was a telemedicine visit.   Location of patient (ex: home, work):  Home   Patient consents to a telephone visit: Yes  Location of the provider (ex: office, home):  Office   Name of any referring provider: Hollace Kinnier, [place] of all persons participating in the telemedicine service and their role in the encounter: Marlowe Sax, NP, Ruthell Rummage CMA, and patient   Time spent on call:  Ruthell Rummage CMA, spent 9 minutes on phone with patient   Provider: Dinah [name] FNP-C  Gayland Curry, DO  Patient Care Team: Gayland Curry, DO as PCP - General (Geriatric Medicine) Martinique, Peter M, MD as PCP - Cardiology (Cardiology) Larey Dresser, MD as PCP - Advanced Heart Failure (Cardiology) Elmarie Shiley, MD (Nephrology) Milus Banister, MD (Gastroenterology)  Extended Emergency Contact Information Primary Emergency Contact: [place] Mobile Phone: [phone] Relation: Sister  Code Status: full code  Goals of care: Advanced Directive information Advanced Directives 01/04/2019  Does Patient Have a Medical Advance Directive? No  Type of Advance Directive -  Does patient want to make changes to medical advance directive? -  Copy of [place] in Chart? -  Would patient like information on creating a medical advance directive? -  Pre-existing out of facility DNR order (yellow form or pink MOST form) -     Chief Complaint  Patient presents with  . Acute Visit    Having dizziness, frequent diarrhea, fatigue, weakness, loss of appetite and chills.Patient states duration of 4 to 5 days.    HPI:  Pt is a 73 y.o. male seen today  for an acute visit for evaluation of Having dizziness, frequent diarrhea, fatigue, weakness, loss of appetite and chills x 4 to 5 days.Nothing taste good and cannot smell. He states diarrhea just runs down his legs.He has taken  imodium without any relief.he denies any abdominal pain,nausea or vomiting.He did not check his temperature but has had chills.He states felt better yesterday was able to go for his visit with cardiology. He states his heart rate today is 104 b/min. He denies any cough or contact with sick person with COVID-19.He wears his mask when out of the house.  Past Medical History:  Diagnosis Date  . Anemia   . Anxiety   . Benign paroxysmal positional vertigo   . Bipolar I disorder, most recent episode (or current) unspecified   . Celiac disease   . Chronic kidney disease, stage III (moderate)   . Crohn's    Remicade q8 weeks  . Depression   . Essential and other specified forms of tremor    medication-induced Parkinson's, now resolved  . Essential hypertension, benign   . Gout 2018  . Heart failure ([place])   . Hypertrophy of prostate without urinary obstruction and other lower urinary tract symptoms (LUTS)   . Impotence of organic origin   . Insomnia with sleep apnea, unspecified   . Iron deficiency anemia, unspecified   . 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 Neuralgia, neuritis, and radiculitis, unspecified   . Other B-complex deficiencies   . Other extrapyramidal disease and abnormal movement disorder   . Postinflammatory pulmonary fibrosis ([place])   . Tobacco use disorder   . Vertigo 2018   Past Surgical History:  Procedure Laterality Date  . CATARACT EXTRACTION, BILATERAL Bilateral 09/2018  . CHOLECYSTECTOMY  07-12-2010  . RIGHT HEART CATH N/A 10/12/2018  Procedure: RIGHT HEART CATH;  Surgeon: Jolaine Artist, MD;  Location: [facility];  Service: Cardiovascular;  Laterality: N/A;  . SMALL INTESTINE SURGERY     x 2    Allergies  Allergen Reactions  . Azathioprine Other (See Comments)    REACTION: affected WBC "Almost died"  . Ciprofloxacin Other (See Comments)    Unknown rxn  . Levaquin [Levofloxacin In D5w] Other (See Comments)    Unknown rxn  . Plendil [Felodipine] Other (See  Comments)    Unknown rxn    Outpatient Encounter Medications as of 01/18/2019  Medication Sig  . allopurinol (ZYLOPRIM) 100 MG tablet Take 2 tablets (200 mg total) by mouth daily.  . cholecalciferol (VITAMIN D) 1000 units tablet Take 5,000 Units by mouth daily.   . divalproex (DEPAKOTE) 500 MG EC tablet Take 1,500 mg by mouth at bedtime.   Marland Kitchen epoetin alfa (PROCRIT) 27253 UNIT/ML injection Inject 10,000 Units into the skin every 30 (thirty) days.   . ferrous sulfate 325 (65 FE) MG tablet Take 325 mg by mouth every Monday, Wednesday, and Friday.   . furosemide (LASIX) 20 MG tablet Take 1 tablet (20 mg total) by mouth every other day.  . hydrALAZINE (APRESOLINE) 25 MG tablet Take 1 tablet (25 mg total) by mouth 2 (two) times daily.  Marland Kitchen inFLIXimab (REMICADE) 100 MG injection Infuse Remicade IV schedule 1 59m/kg every 8 weeks Premedicate with Tylenol 500-6541mby mouth and Benadryl 25-5063my mouth prior to infusion. Last PPD was on 12/2009.   . isosorbide mononitrate (IMDUR) 30 MG 24 hr tablet Take 1 tablet (30 mg total) by mouth daily.  . ivabradine (CORLANOR) 5 MG TABS tablet Take 1 tablet (5 mg total) by mouth 2 (two) times daily with a meal.  . loperamide (IMODIUM) 2 MG capsule Take 4 mg by mouth daily as needed for diarrhea or loose stools.  . magnesium gluconate (MAGONATE) 30 MG tablet Take 1 tablet (30 mg total) by mouth every Monday, Wednesday, and Friday.  . metoprolol succinate (TOPROL-XL) 25 MG 24 hr tablet Take 1 tablet (25 mg total) by mouth at bedtime.  . OMarland KitchenANZapine (ZYPREXA) 7.5 MG tablet Take 1 tablet (7.5 mg total) by mouth at bedtime.  . oMarland Kitcheneprazole (PRILOSEC) 20 MG capsule Take 20 mg by mouth daily before supper.    . tamsulosin (FLOMAX) 0.4 MG CAPS capsule Take 0.4 mg by mouth daily.  . [DISCONTINUED] polyethylene glycol (MIRALAX / GLYCOLAX) packet Take 17 g by mouth daily as needed for mild constipation.   No facility-administered encounter medications on file as of  01/18/2019.     Review of Systems  Constitutional: Positive for appetite change, chills and fatigue. Negative for fever.  HENT: Negative for congestion, rhinorrhea, sinus pressure, sinus pain, sneezing, sore throat and trouble swallowing.   Respiratory: Negative for cough, chest tightness, shortness of breath and wheezing.   Cardiovascular: Negative for chest pain, palpitations and leg swelling.  Gastrointestinal: Positive for diarrhea. Negative for abdominal distention, abdominal pain, constipation, nausea and vomiting.  Genitourinary: Negative for decreased urine volume, difficulty urinating, dysuria, flank pain and frequency.  Musculoskeletal: Positive for gait problem. Negative for myalgias.  Skin: Negative for color change, pallor, rash and wound.  Neurological: Positive for dizziness. Negative for speech difficulty, light-headedness and headaches.       Generalized weakness   Psychiatric/Behavioral: Negative for agitation and sleep disturbance. The patient is not nervous/anxious.     Immunization History  Administered Date(s) Administered  . Fluad  Quad(high Dose 65+) 11/15/2018  . Influenza, High Dose Seasonal PF 11/26/2016  . Influenza,inj,Quad PF,6+ Mos 11/13/2014, 01/03/2016  . Influenza-Unspecified 10/25/2012, 11/24/2016, 11/24/2017  . PPD Test 12/30/2010, 01/05/2012, 01/07/2013, 01/13/2014  . Pneumococcal Conjugate-13 08/10/2014  . Pneumococcal Polysaccharide-23 03/05/2004, 01/03/2016, 05/06/2018  . Tdap 05/26/2011   Pertinent  Health Maintenance Due  Topic Date Due  . COLONOSCOPY  09/17/2027  . INFLUENZA VACCINE  Completed  . PNA vac Low Risk Adult  Completed   Fall Risk  01/18/2019 01/04/2019 12/13/2018 10/04/2018 09/07/2018  Falls in the past year? 0 0 0 0 1  Number falls in past yr: 0 - - 0 0  Injury with Fall? 0 - - 0 0    Vitals:   01/18/19 1047  BP: 126/76  Pulse: (!) 104   There is no height or weight on file to calculate BMI. Physical Exam  Unable to  complete on telephone visit.   Labs reviewed: Recent Labs    05/08/18 0256 05/09/18 0251 05/10/18 0330  09/01/18 0608 09/02/18 0514  09/08/18 0248  10/11/18 1253  10/12/18 1555 11/10/18 1504 01/17/19 0856  NA 141 143 142   < > 138 139   < > 139   < > 141   < > 145 138 144  K 3.9 3.9 4.1   < > 4.5 4.0   < > 3.9   < > 4.8   < > 4.1 4.1 3.4*  CL 108 112* 112*   < > 111 110   < > 113*   < > 114*  --   --  109 111  CO2 26 27 25    < > 15* 15*   < > 16*   < > 19*  --   --  22 24  GLUCOSE 99 95 86   < > 100* 84   < > 95   < > 116*  --   --  98 94  BUN 28* 26* 20   < > 51* 67*   < > 57*   < > 39*  --   --  23 20  CREATININE 2.13* 1.86* 1.82*   < > 2.42* 2.46*   < > 2.44*   < > 2.65*  --   --  2.14* 2.23*  CALCIUM 8.5* 8.1* 8.4*   < > 7.3* 7.6*   < > 9.0   < > 9.0  --   --  8.5* 8.6*  MG 1.9 1.8 1.8  --  1.0* 1.0*  --  1.6*  --   --   --   --   --   --   PHOS 3.0 3.1 3.0  --   --   --   --   --   --   --   --   --   --   --    < > = values in this interval not displayed.   Recent Labs    09/02/18 0514 09/08/18 0248 09/09/18 0443  AST 21 13* 12*  ALT 20 11 10   ALKPHOS 70 61 50  BILITOT 0.2* 0.5 0.4  PROT 7.6 6.7 6.3*  ALBUMIN 3.6 3.0* 2.7*   Recent Labs    08/31/18 0134 09/02/18 0514  09/07/18 1708 09/08/18 0248  10/12/18 1230  11/08/18 1005 12/08/18 1035 01/07/19 0955  WBC 12.3* 11.0*  --  10.8* 9.0  --  10.6*  --   --   --   --   NEUTROABS 10.3*  6.6  --   --  5.4  --   --   --   --   --   --   HGB 9.7* 10.9*   < > 11.0* 10.7*   < > 9.5*   < > 9.7* 10.5* 10.4*  HCT 31.5* 35.3*   < > 35.8* 33.6*  32.0*   < > 31.3*   < > 32.0* 34.4* 34.6*  MCV 109.4* 106.3*  --  108.5* 104.7*  --  114.2*  --   --   --   --   PLT 199 247  --  237 201  --  202  --   --   --   --    < > = values in this interval not displayed.   Lab Results  Component Value Date   TSH 0.744 09/08/2018   Lab Results  Component Value Date   HGBA1C 4.9 08/10/2015   Lab Results  Component Value Date    CHOL 138 05/04/2017   HDL 42 05/04/2017   LDLCALC 71 05/04/2017   TRIG 176 (H) 05/04/2017   CHOLHDL 3.3 05/04/2017    Significant Diagnostic Results in last 30 days:  No results found.  Assessment/Plan 1. Chronic diarrhea Has hx of crohn's disease but diarrhea has worsen in the past 4-5 days running down his legs.Has had poor appetite,weakness and chills.HR 104 b/min.given his poor oral intake encouraged to be evaluated in the ED for possible dehydration.  2. Generalized weakness Suspect due to his worsening diarrhea.Send to ED as above.   3. Dizziness No B/p for evaluation but could be related due dehydration secondary to frequent diarrhea.   4. Loss of appetite Hx of crohn's disease with worsening diarrhea.Advised to be evaluated in the ED.  5. Loss of smell / Loss of taste Will need to rule out COVID-19.wears mask when out of his house.  6. Tachycardia His HR was 104 b/min today.repoerts no palpitation or chest pain.Agrees to go to ED for further evaluation.suspect possible dehydration from worsening diarrhea and his poor oral intake.   Family/ staff Communication: Reviewed plan of care with patient.  Labs/tests ordered: Advised to go to ED for further evaluation.   Spent 12 minutes of non-face to face with patient    Sandrea Hughs, NP

## 2019-01-18 NOTE — ED Notes (Signed)
Ambulated pt in hallway, SpO2 stayed at 99% while ambulating.

## 2019-01-18 NOTE — ED Notes (Signed)
Discharge instructions reviewed with pt. Pt verbalized understanding.   

## 2019-01-18 NOTE — Patient Instructions (Signed)
Go to ED for evaluation of worsening diarrhea,high heart rate,poor appetite,weakness,dizziness,loss of smell/taste.

## 2019-01-18 NOTE — ED Triage Notes (Signed)
Pt reports 1 week of dizziness and generalized weakness. States his SBP was reading in the 170s this morning. Pt denies any dizziness at this time. Pt a.o.

## 2019-01-18 NOTE — Telephone Encounter (Signed)
[name] called earlier and said that he has diarehea chills and body aches.  I told him to call his pcp to see if a COVID-19 test is needed due to the symptoms that he has.    He called and said that he's getting admitted for dehydration.  I will f/u with him tomorrow to find out his status.

## 2019-01-19 ENCOUNTER — Ambulatory Visit (INDEPENDENT_AMBULATORY_CARE_PROVIDER_SITE_OTHER): Payer: Medicare Other | Admitting: Family

## 2019-01-19 ENCOUNTER — Other Ambulatory Visit (HOSPITAL_COMMUNITY): Payer: Self-pay

## 2019-01-19 ENCOUNTER — Telehealth (HOSPITAL_COMMUNITY): Payer: Self-pay | Admitting: *Deleted

## 2019-01-19 ENCOUNTER — Encounter: Payer: Self-pay | Admitting: Family

## 2019-01-19 VITALS — BP 120/75 | HR 83

## 2019-01-19 DIAGNOSIS — Z0000 Encounter for general adult medical examination without abnormal findings: Secondary | ICD-10-CM | POA: Diagnosis not present

## 2019-01-19 DIAGNOSIS — I5022 Chronic systolic (congestive) heart failure: Secondary | ICD-10-CM

## 2019-01-19 MED ORDER — POTASSIUM CHLORIDE ER 10 MEQ PO TBCR: EXTENDED_RELEASE_TABLET | ORAL | 3 refills | Status: DC

## 2019-01-19 NOTE — Progress Notes (Signed)
Paramedicine Encounter    Patient ID: Brent [name], male    DOB: 29-Oct-1945, 73 y.o.   MRN: [id number]    Patient Care Team: Gayland Curry, DO as PCP - General (Geriatric Medicine) [place], Peter M, MD as PCP - Cardiology (Cardiology) Larey Dresser, MD as PCP - Advanced Heart Failure (Cardiology) Elmarie Shiley, MD (Nephrology) Milus Banister, MD (Gastroenterology)  Patient Active Problem List   Diagnosis Date Noted  . Metabolic acidosis 83/15/1761  . Orthostasis 09/07/2018  . AKI (acute kidney injury) ([place])   . Immunosuppressed status ([place])   . Macrocytic anemia   . Chronic combined systolic and diastolic congestive heart failure ([place]) 06/03/2018  . Candida esophagitis ([place]) 09/22/2017  . History of smoking 30 or more pack years 01/05/2017  . Bipolar 1 disorder ([place])   . BPH (benign prostatic hyperplasia) 03/31/2013  . 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 Essential hypertension, benign 03/31/2013  . Acute renal failure superimposed on stage 3 chronic kidney disease ([place]) 06/04/2012  . 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  allopurinol (ZYLOPRIM) 100 MG tablet, Take 2 tablets (200 mg total) by mouth daily., Disp: 60 tablet, Rfl: 5 .  divalproex (DEPAKOTE) 500 MG EC tablet, Take 1,500 mg by mouth at bedtime. , Disp: , Rfl:  .  ferrous sulfate 325 (65 FE) MG tablet, Take 325 mg by mouth every Monday, Wednesday, and Friday. , Disp: , Rfl:  .  furosemide (LASIX) 20 MG tablet, Take 1 tablet (20 mg total) by mouth every other day., Disp: 45 tablet, Rfl: 3 .  hydrALAZINE (APRESOLINE) 25 MG tablet, Take 1 tablet (25 mg total) by mouth 2 (two) times daily., Disp: 60 tablet, Rfl: 3 .  isosorbide mononitrate (IMDUR) 30 MG 24 hr tablet, Take 1 tablet (30 mg total) by mouth daily., Disp: 90 tablet, Rfl: 3 .  ivabradine (CORLANOR) 5 MG TABS tablet, Take 1 tablet (5 mg total) by mouth 2 (two) times daily with a meal., Disp: 180 tablet,  Rfl: 3 .  magnesium gluconate (MAGONATE) 30 MG tablet, Take 1 tablet (30 mg total) by mouth every Monday, Wednesday, and Friday., Disp: 10 tablet, Rfl: 0 .  metoprolol succinate (TOPROL-XL) 25 MG 24 hr tablet, Take 1 tablet (25 mg total) by mouth at bedtime., Disp: 90 tablet, Rfl: 3 .  OLANZapine (ZYPREXA) 7.5 MG tablet, Take 1 tablet (7.5 mg total) by mouth at bedtime., Disp: 30 tablet, Rfl: 11 .  omeprazole (PRILOSEC) 20 MG capsule, Take 20 mg by mouth daily before supper.  , Disp: , Rfl:  .  cholecalciferol (VITAMIN D) 1000 units tablet, Take 5,000 Units by mouth daily. , Disp: , Rfl:  .  epoetin alfa (PROCRIT) 60737 UNIT/ML injection, Inject 10,000 Units into the skin every 30 (thirty) days. , Disp: , Rfl:  .  inFLIXimab (REMICADE) 100 MG injection, Infuse Remicade IV schedule 1 34m/kg every 8 weeks Premedicate with Tylenol 500-659mby mouth and Benadryl 25-5056my mouth prior to infusion. Last PPD was on 12/2009. , Disp: 1 each, Rfl: 6 .  loperamide (IMODIUM) 2 MG capsule, Take 1 capsule (2 mg total) by mouth 4 (four) times daily as needed for diarrhea or loose stools., Disp: 12 capsule, Rfl: 0 .  potassium chloride (KLOR-CON) 10 MEQ tablet, Take 1 tablet every other day with lasix, Disp: 90 tablet, Rfl: 3 .  tamsulosin (FLOMAX) 0.4 MG CAPS capsule, Take 0.4 mg by mouth daily., Disp: ,  Rfl:  Allergies  Allergen Reactions  . Azathioprine Other (See Comments)    REACTION: affected WBC "Almost died"  . Ciprofloxacin Other (See Comments)    Unknown rxn  . Levaquin [Levofloxacin In D5w] Other (See Comments)    Unknown rxn  . Plendil [Felodipine] Other (See Comments)    Unknown rxn     Social History   Socioeconomic History  . Marital status: Divorced    Spouse name: Not on file  . Number of children: 1  . Years of education: Not on file  . Highest education level: Not on file  Occupational History  . Occupation: retired  . Occupation:  [employer]  . Financial resource  strain: Not hard at all  . Food insecurity    Worry: Never true    Inability: Never true  . Transportation needs    Medical: No    Non-medical: No  Tobacco Use  . Smoking status: Former Smoker    Packs/day: 1.00    Years: 49.00    Pack years: 49.00    Types: Cigarettes    Start date: 04/11/1956    Quit date: 04/17/2014    Years since quitting: 4.7  . Smokeless tobacco: Never Used  Substance and Sexual Activity  . Alcohol use: No    Alcohol/week: 0.0 standard drinks  . Drug use: No  . Sexual activity: Never  Lifestyle  . Physical activity    Days per week: 0 days    Minutes per session: 0 min  . Stress: Not at all  Relationships  . Social connections    Talks on phone: More than three times a week    Gets together: Three times a week    Attends religious service: More than 4 times per year    Active member of club or organization: Yes    Attends meetings of clubs or organizations: 1 to 4 times per year    Relationship status: Divorced  . Intimate partner violence    Fear of current or ex partner: No    Emotionally abused: No    Physically abused: No    Forced sexual activity: No  Other Topics Concern  . Not on file  Social History Narrative  . Not on file    Physical Exam Pulmonary:     Effort: Pulmonary effort is normal. No respiratory distress.     Breath sounds: No wheezing or rales.  Abdominal:     General: Abdomen is flat. There is no distension.     Palpations: Abdomen is soft.         Future Appointments  Date Time Provider [place]  01/27/2019  2:15 PM [facility] LAB [facility] None  02/07/2019  9:00 AM [facility] RM 1 [facility] None  02/08/2019  2:00 PM [facility] PHASE II ORIENT [facility] None  02/14/2019  1:45 PM [facility] PHASE II EXC [facility] None  02/14/2019  2:15 PM Reed, Tiffany L, DO [facility] None  02/16/2019  1:45 PM [facility] PHASE II EXC [facility] None  02/21/2019  1:45 PM [facility] PHASE II EXC [facility] None  02/23/2019  1:45 PM [facility]  PHASE II EXC [facility] None  02/28/2019  1:45 PM [facility] PHASE II EXC [facility] None  03/02/2019  1:45 PM [facility] PHASE II EXC [facility] None  03/04/2019  1:45 PM [facility] PHASE II EXC [facility] None  03/07/2019  1:45 PM [facility] PHASE II EXC [facility] None  03/09/2019  1:45 PM [facility] PHASE II EXC [facility] None  03/11/2019  1:45 PM  [facility] PHASE II EXC [facility] None  03/14/2019  1:45 PM [facility] PHASE II EXC [facility] None  03/16/2019  1:45 PM [facility] PHASE II EXC [facility] None  03/18/2019  1:45 PM [facility] PHASE II EXC [facility] None  03/21/2019  1:45 PM [facility] PHASE II EXC [facility] None  03/23/2019  1:45 PM [facility] PHASE II EXC [facility] None  03/25/2019  1:45 PM [facility] PHASE II EXC [facility] None  03/28/2019  1:45 PM [facility] PHASE II EXC [facility] None  03/30/2019  1:45 PM [facility] PHASE II EXC [facility] None  04/01/2019  1:45 PM [facility] PHASE II EXC [facility] None  04/04/2019  1:45 PM [facility] PHASE II EXC [facility] None  04/06/2019  1:45 PM [facility] PHASE II EXC [facility] None  04/08/2019  1:45 PM [facility] PHASE II EXC [facility] None  04/19/2019 10:00 AM Larey Dresser, MD [facility] None  01/20/2020  2:15 PM [name], [name], NP [facility] None     BP 116/78 (BP Location: Right Arm, Patient Position: Sitting, Cuff Size: Normal)   Pulse 73   Temp 98.7 F (37.1 C)   Wt 164 lb 4.8 oz (74.5 kg)   SpO2 97%   BMI 20.00 kg/m   Weight yesterday-164 Last visit weight-167  ATF pt CAO x4; he has no complaints at this time. He went to the ED yesterday and was treated for hypotension.  He denies diarrhea today.  He also denies sob, chest pain and dizziness. He's taken all of his meds for the week.  rx bottles verified and pill box refilled.     Medication ordered: None  Smayan Hackbart, EMT Paramedic [phone] 01/19/2019    ACTION: Home visit completed

## 2019-01-19 NOTE — Progress Notes (Signed)
Subjective:   Brent [name] is a 73 y.o. male who presents for Medicare Annual/Subsequent preventive examination. Denies any acute issues.Recent diarrhea has resolved.   Review of Systems:   Cardiac Risk Factors include: advanced age (>41mn, >>40women);hypertension;male gender;sedentary lifestyle;smoking/ tobacco exposure     Objective:    Vitals: BP 120/75   Pulse 83   There is no height or weight on file to calculate BMI.  Advanced Directives 01/18/2019 01/04/2019 12/13/2018 11/15/2018 10/12/2018 09/08/2018 09/03/2018  Does Patient Have a Medical Advance Directive? No No Yes Yes Yes Yes No  Type of Advance Directive - - -Public librarianOut of facility DNR (pink MOST or yellow form) HLudowiciLiving will Out of facility DNR (pink MOST or yellow form) -  Does patient want to make changes to medical advance directive? - - - No - Patient declined No - Patient declined Yes (Inpatient - patient defers changing a medical advance directive at this time - Information given) -  Copy of [place] Chart? - - - No - copy requested No - copy requested - -  Would patient like information on creating a medical advance directive? No - Patient declined - - - - No - Patient declined -  Pre-existing out of facility DNR order (yellow form or pink MOST form) - - - Pink MOST form placed in chart (order not valid for inpatient use) - Physician notified to receive inpatient order -    Tobacco Social History   Tobacco Use  Smoking Status Former Smoker  . Packs/day: 1.00  . Years: 49.00  . Pack years: 49.00  . Types: Cigarettes  . Start date: 04/11/1956  . Quit date: 04/17/2014  . Years since quitting: 4.7  Smokeless Tobacco Never Used     Counseling given: Not Answered   Clinical Intake:  Pre-visit preparation completed: No  Pain Score: (sometimes left knee pain rates 9/10 on scale) Pain Type: Chronic pain Pain Location: Knee Pain  Orientation: Left Pain Radiating Towards: None Pain Descriptors / Indicators: Aching Pain Onset: Other (comment)(several years) Pain Frequency: Constant Pain Relieving Factors: Has not tried any medication. Effect of Pain on Daily Activities: walking  Pain Relieving Factors: Has not tried any medication.  BMI - recorded: 21.01 Nutritional Status: BMI of 19-24  Normal Nutritional Risks: None Diabetes: No  How often do you need to have someone help you when you read instructions, pamphlets, or other written materials from your doctor or pharmacy?: 1 - Never What is the last grade level you completed in school?: 2 years of trade school  Interpreter Needed?: No  Information entered by :: Dinah [name] FNP-C  Past Medical History:  Diagnosis Date  . Anemia   . Anxiety   . Benign paroxysmal positional vertigo   . Bipolar I disorder, most recent episode (or current) unspecified   . Celiac disease   . CHF (congestive heart failure) (HAddis   . Chronic kidney disease, stage III (moderate)   . Crohn's    Remicade q8 weeks  . Depression   . Essential and other specified forms of tremor    medication-induced Parkinson's, now resolved  . Essential hypertension, benign   . Gout 2018  . Heart failure (HLeigh   . Hypertrophy of prostate without urinary obstruction and other lower urinary tract symptoms (LUTS)   . Impotence of organic origin   . Insomnia with sleep apnea, unspecified   . Iron deficiency anemia, unspecified   .  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 Neuralgia, neuritis, and radiculitis, unspecified   . Other B-complex deficiencies   . Other extrapyramidal disease and abnormal movement disorder   . Postinflammatory pulmonary fibrosis ([place])   . Tobacco use disorder   . Vertigo 2018   Past Surgical History:  Procedure Laterality Date  . CATARACT EXTRACTION, BILATERAL Bilateral 09/2018  . CHOLECYSTECTOMY  07-12-2010  . RIGHT HEART CATH N/A 10/12/2018   Procedure: RIGHT HEART CATH;   Surgeon: Jolaine Artist, MD;  Location: [facility];  Service: Cardiovascular;  Laterality: N/A;  . SMALL INTESTINE SURGERY     x 2   Family History  Problem Relation Age of Onset  . Diabetes Mother        maternal grandmother  . Uterine cancer Mother   . Emphysema Father   . Pneumonia Maternal Grandmother   . Colon cancer Neg Hx    Social History   Socioeconomic History  . Marital status: Divorced    Spouse name: Not on file  . Number of children: 1  . Years of education: Not on file  . Highest education level: Not on file  Occupational History  . Occupation: retired  . Occupation: [employer]  . Financial resource strain: Not hard at all  . Food insecurity    Worry: Never true    Inability: Never true  . Transportation needs    Medical: No    Non-medical: No  Tobacco Use  . Smoking status: Former Smoker    Packs/day: 1.00    Years: 49.00    Pack years: 49.00    Types: Cigarettes    Start date: 04/11/1956    Quit date: 04/17/2014    Years since quitting: 4.7  . Smokeless tobacco: Never Used  Substance and Sexual Activity  . Alcohol use: No    Alcohol/week: 0.0 standard drinks  . Drug use: No  . Sexual activity: Never  Lifestyle  . Physical activity    Days per week: 0 days    Minutes per session: 0 min  . Stress: Not at all  Relationships  . Social connections    Talks on phone: More than three times a week    Gets together: Three times a week    Attends religious service: More than 4 times per year    Active member of club or organization: Yes    Attends meetings of clubs or organizations: 1 to 4 times per year    Relationship status: Divorced  Other Topics Concern  . Not on file  Social History Narrative  . Not on file    Outpatient Encounter Medications as of 01/19/2019  Medication Sig  . allopurinol (ZYLOPRIM) 100 MG tablet Take 2 tablets (200 mg total) by mouth daily.  . cholecalciferol (VITAMIN D) 1000 units tablet Take  5,000 Units by mouth daily.   . divalproex (DEPAKOTE) 500 MG EC tablet Take 1,500 mg by mouth at bedtime.   Marland Kitchen epoetin alfa (PROCRIT) 61443 UNIT/ML injection Inject 10,000 Units into the skin every 30 (thirty) days.   . ferrous sulfate 325 (65 FE) MG tablet Take 325 mg by mouth every Monday, Wednesday, and Friday.   . furosemide (LASIX) 20 MG tablet Take 1 tablet (20 mg total) by mouth every other day.  . hydrALAZINE (APRESOLINE) 25 MG tablet Take 1 tablet (25 mg total) by mouth 2 (two) times daily.  Marland Kitchen inFLIXimab (REMICADE) 100 MG injection Infuse Remicade IV schedule 1 38m/kg every 8 weeks  Premedicate with Tylenol 500-660m by mouth and Benadryl 25-560mby mouth prior to infusion. Last PPD was on 12/2009.   . isosorbide mononitrate (IMDUR) 30 MG 24 hr tablet Take 1 tablet (30 mg total) by mouth daily.  . ivabradine (CORLANOR) 5 MG TABS tablet Take 1 tablet (5 mg total) by mouth 2 (two) times daily with a meal.  . loperamide (IMODIUM) 2 MG capsule Take 1 capsule (2 mg total) by mouth 4 (four) times daily as needed for diarrhea or loose stools.  . magnesium gluconate (MAGONATE) 30 MG tablet Take 1 tablet (30 mg total) by mouth every Monday, Wednesday, and Friday.  . metoprolol succinate (TOPROL-XL) 25 MG 24 hr tablet Take 1 tablet (25 mg total) by mouth at bedtime.  . Marland KitchenLANZapine (ZYPREXA) 7.5 MG tablet Take 1 tablet (7.5 mg total) by mouth at bedtime.  . Marland Kitchenmeprazole (PRILOSEC) 20 MG capsule Take 20 mg by mouth daily before supper.    . potassium chloride (KLOR-CON) 10 MEQ tablet Take 1 tablet every other day with lasix  . tamsulosin (FLOMAX) 0.4 MG CAPS capsule Take 0.4 mg by mouth daily.   No facility-administered encounter medications on file as of 01/19/2019.     Activities of Daily Living In your present state of health, do you have any difficulty performing the following activities: 01/19/2019 09/08/2018  Hearing? N N  Vision? N N  Difficulty concentrating or making decisions? Y N   Comment remembering -  Walking or climbing stairs? Y Y  Comment limited due to shortness of breath -  Dressing or bathing? N N  Doing errands, shopping? N N  Preparing Food and eating ? Y -  Comment loss of appetite now has someone to assist with cooking good food -  Using the Toilet? N -  In the past six months, have you accidently leaked urine? N -  Do you have problems with loss of bowel control? Y -  Managing your Medications? N -  Managing your Finances? N -  Housekeeping or managing your Housekeeping? N -  Some recent data might be hidden    Patient Care Team: ReGayland CurryDO as PCP - General (Geriatric Medicine) JoMartiniquePeter M, MD as PCP - Cardiology (Cardiology) McLarey DresserMD as PCP - Advanced Heart Failure (Cardiology) PaElmarie ShileyMD (Nephrology) JaMilus BanisterMD (Gastroenterology)   Assessment:   This is a routine wellness examination for JaKurtiss Exercise Activities and Dietary recommendations Current Exercise Habits: The patient does not participate in regular exercise at present, Exercise limited by: Other - see comments(limited by left knee)  Goals    . Increase water intake     Starting 01/03/16, I will attempt to increase my water intake to 8 glasses per day.     . Increase water intake     Increase water to 2-3 glasses a day    . Increase water intake     Patient will increase water intake to 2-3 glasses/day    . LIFESTYLE - ATTEND STRESS MANAGEMENT CLASSES     I will try to keep stress down        Fall Risk Fall Risk  01/19/2019 01/18/2019 01/04/2019 12/13/2018 10/04/2018  Falls in the past year? 0 0 0 0 0  Number falls in past yr: 0 0 - - 0  Injury with Fall? 0 0 - - 0    Is the patient's home free of loose throw rugs in walkways, pet beds, electrical cords, etc?  yes has a runner but has secured       Grab bars in the bathroom? no      Handrails on the stairs?   no      Adequate lighting?   yes  Depression Screen PHQ 2/9  Scores 01/19/2019 12/13/2018 09/03/2018 07/06/2018  PHQ - 2 Score 0 0 0 1    Cognitive Function MMSE - Mini Mental State Exam 01/13/2018 01/05/2017 01/03/2016  Orientation to time 5 5 5   Orientation to Place 5 5 5   Registration 3 3 3   Attention/ Calculation 3 3 4   Recall 0 0 3  Language- name 2 objects 2 2 2   Language- repeat 1 1 1   Language- follow 3 step command 3 3 2   Language- read & follow direction 1 1 1   Write a sentence 1 1 1   Copy design 1 1 1   Total score 25 25 28      6CIT Screen 01/19/2019  What Year? 0 points  What month? 0 points  What time? 0 points  Count back from 20 4 points  Months in reverse 4 points  Repeat phrase 4 points  Total Score 12    Immunization History  Administered Date(s) Administered  . Fluad Quad(high Dose 65+) 11/15/2018  . Influenza, High Dose Seasonal PF 11/26/2016  . Influenza,inj,Quad PF,6+ Mos 11/13/2014, 01/03/2016  . Influenza-Unspecified 10/25/2012, 11/24/2016, 11/24/2017  . PPD Test 12/30/2010, 01/05/2012, 01/07/2013, 01/13/2014  . Pneumococcal Conjugate-13 08/10/2014  . Pneumococcal Polysaccharide-23 03/05/2004, 01/03/2016, 05/06/2018  . Tdap 05/26/2011    Qualifies for Shingles Vaccine? Has had one dose at the [facility]  Topic Date Due  . Hepatitis C Screening  01/29/2019 (Originally July 26, 1945)  . TETANUS/TDAP  05/25/2021  . COLONOSCOPY  09/17/2027  . INFLUENZA VACCINE  Completed  . PNA vac Low Risk Adult  Completed   Cancer Screenings: Lung: Low Dose CT Chest recommended if Age 17-80 years, 30 pack-year currently smoking OR have quit w/in 15years. Patient does not qualify. Colorectal: Up to date   Additional Screenings:  Hepatitis C Screening:Low Risk   Plan:  - Due for second dose of shingrix per patient has upcoming appointment to be administered at the [place] hospital.Patient will request records from [place] to up date immunization.  I have personally reviewed and noted the following in  the patient's chart:   . Medical and social history . Use of alcohol, tobacco or illicit drugs  . Current medications and supplements . Functional ability and status . Nutritional status . Physical activity . Advanced directives . List of other physicians . Hospitalizations, surgeries, and ER visits in previous 12 months . Vitals . Screenings to include cognitive, depression, and falls . Referrals and appointments  In addition, I have reviewed and discussed with patient certain preventive protocols, quality metrics, and best practice recommendations. A written personalized care plan for preventive services as well as general preventive health recommendations were provided to patient.  Sandrea Hughs, NP  01/19/2019

## 2019-01-19 NOTE — Patient Instructions (Signed)
[name] , Thank you for taking time to come for your Medicare Wellness Visit. I appreciate your ongoing commitment to your health goals. Please review the following plan we discussed and let me know if I can assist you in the future.   Screening recommendations/referrals: Colonoscopy : Up to date  Recommended yearly ophthalmology/optometry visit for glaucoma screening and checkup Recommended yearly dental visit for hygiene and checkup  Vaccinations: Influenza vaccine: Up to date  Pneumococcal vaccine : Up to date  Tdap vaccine : Up to date due 05/25/2021  Shingles vaccine : second dose due at the [facility] please send completed records to  [facility].   Advanced directives: No   Conditions/risks identified: Advance age male > 18 years old,male Gender,Hypertension,sedentary lifestyle,Hx of smoking   Next appointment: 1 Year   Preventive Care 26 Years and Older, Male Preventive care refers to lifestyle choices and visits with your health care provider that can promote health and wellness. What does preventive care include?  A yearly physical exam. This is also called an annual well check.  Dental exams once or twice a year.  Routine eye exams. Ask your health care provider how often you should have your eyes checked.  Personal lifestyle choices, including:  Daily care of your teeth and gums.  Regular physical activity.  Eating a healthy diet.  Avoiding tobacco and drug use.  Limiting alcohol use.  Practicing safe sex.  Taking low doses of aspirin every day.  Taking vitamin and mineral supplements as recommended by your health care provider. What happens during an annual well check? The services and screenings done by your health care provider during your annual well check will depend on your age, overall health, lifestyle risk factors, and family history of disease. Counseling  Your health care provider may ask you questions about your:  Alcohol use.  Tobacco  use.  Drug use.  Emotional well-being.  Home and relationship well-being.  Sexual activity.  Eating habits.  History of falls.  Memory and ability to understand (cognition).  Work and work Statistician. Screening  You may have the following tests or measurements:  Height, weight, and BMI.  Blood pressure.  Lipid and cholesterol levels. These may be checked every 5 years, or more frequently if you are over 43 years old.  Skin check.  Lung cancer screening. You may have this screening every year starting at age 9 if you have a 30-pack-year history of smoking and currently smoke or have quit within the past 15 years.  Fecal occult blood test (FOBT) of the stool. You may have this test every year starting at age 44.  Flexible sigmoidoscopy or colonoscopy. You may have a sigmoidoscopy every 5 years or a colonoscopy every 10 years starting at age 72.  Prostate cancer screening. Recommendations will vary depending on your family history and other risks.  Hepatitis C blood test.  Hepatitis B blood test.  Sexually transmitted disease (STD) testing.  Diabetes screening. This is done by checking your blood sugar (glucose) after you have not eaten for a while (fasting). You may have this done every 1-3 years.  Abdominal aortic aneurysm (AAA) screening. You may need this if you are a current or former smoker.  Osteoporosis. You may be screened starting at age 86 if you are at high risk. Talk with your health care provider about your test results, treatment options, and if necessary, the need for more tests. Vaccines  Your health care provider may recommend certain vaccines, such  as:  Influenza vaccine. This is recommended every year.  Tetanus, diphtheria, and acellular pertussis (Tdap, Td) vaccine. You may need a Td booster every 10 years.  Zoster vaccine. You may need this after age 40.  Pneumococcal 13-valent conjugate (PCV13) vaccine. One dose is recommended after age  66.  Pneumococcal polysaccharide (PPSV23) vaccine. One dose is recommended after age 4. Talk to your health care provider about which screenings and vaccines you need and how often you need them. This information is not intended to replace advice given to you by your health care provider. Make sure you discuss any questions you have with your health care provider. Document Released: 03/09/2015 Document Revised: 10/31/2015 Document Reviewed: 12/12/2014 Elsevier Interactive Patient Education  2017 [place] Prevention in the Home Falls can cause injuries. They can happen to people of all ages. There are many things you can do to make your home safe and to help prevent falls. What can I do on the outside of my home?  Regularly fix the edges of walkways and driveways and fix any cracks.  Remove anything that might make you trip as you walk through a door, such as a raised step or threshold.  Trim any bushes or trees on the path to your home.  Use bright outdoor lighting.  Clear any walking paths of anything that might make someone trip, such as rocks or tools.  Regularly check to see if handrails are loose or broken. Make sure that both sides of any steps have handrails.  Any raised decks and porches should have guardrails on the edges.  Have any leaves, snow, or ice cleared regularly.  Use sand or salt on walking paths during winter.  Clean up any spills in your garage right away. This includes oil or grease spills. What can I do in the bathroom?  Use night lights.  Install grab bars by the toilet and in the tub and shower. Do not use towel bars as grab bars.  Use non-skid mats or decals in the tub or shower.  If you need to sit down in the shower, use a plastic, non-slip stool.  Keep the floor dry. Clean up any water that spills on the floor as soon as it happens.  Remove soap buildup in the tub or shower regularly.  Attach bath mats securely with double-sided  non-slip rug tape.  Do not have throw rugs and other things on the floor that can make you trip. What can I do in the bedroom?  Use night lights.  Make sure that you have a light by your bed that is easy to reach.  Do not use any sheets or blankets that are too big for your bed. They should not hang down onto the floor.  Have a firm chair that has side arms. You can use this for support while you get dressed.  Do not have throw rugs and other things on the floor that can make you trip. What can I do in the kitchen?  Clean up any spills right away.  Avoid walking on wet floors.  Keep items that you use a lot in easy-to-reach places.  If you need to reach something above you, use a strong step stool that has a grab bar.  Keep electrical cords out of the way.  Do not use floor polish or wax that makes floors slippery. If you must use wax, use non-skid floor wax.  Do not have throw rugs and other things on the  floor that can make you trip. What can I do with my stairs?  Do not leave any items on the stairs.  Make sure that there are handrails on both sides of the stairs and use them. Fix handrails that are broken or loose. Make sure that handrails are as long as the stairways.  Check any carpeting to make sure that it is firmly attached to the stairs. Fix any carpet that is loose or worn.  Avoid having throw rugs at the top or bottom of the stairs. If you do have throw rugs, attach them to the floor with carpet tape.  Make sure that you have a light switch at the top of the stairs and the bottom of the stairs. If you do not have them, ask someone to add them for you. What else can I do to help prevent falls?  Wear shoes that:  Do not have high heels.  Have rubber bottoms.  Are comfortable and fit you well.  Are closed at the toe. Do not wear sandals.  If you use a stepladder:  Make sure that it is fully opened. Do not climb a closed stepladder.  Make sure that both  sides of the stepladder are locked into place.  Ask someone to hold it for you, if possible.  Clearly mark and make sure that you can see:  Any grab bars or handrails.  First and last steps.  Where the edge of each step is.  Use tools that help you move around (mobility aids) if they are needed. These include:  Canes.  Walkers.  Scooters.  Crutches.  Turn on the lights when you go into a dark area. Replace any light bulbs as soon as they burn out.  Set up your furniture so you have a clear path. Avoid moving your furniture around.  If any of your floors are uneven, fix them.  If there are any pets around you, be aware of where they are.  Review your medicines with your doctor. Some medicines can make you feel dizzy. This can increase your chance of falling. Ask your doctor what other things that you can do to help prevent falls. This information is not intended to replace advice given to you by your health care provider. Make sure you discuss any questions you have with your health care provider. Document Released: 12/07/2008 Document Revised: 07/19/2015 Document Reviewed: 03/17/2014 Elsevier Interactive Patient Education  2017 [employer].

## 2019-01-19 NOTE — Telephone Encounter (Signed)
Notes recorded by Harvie Junior, CMA on 01/19/2019 at 8:25 AM EST  Pt aware and verbalized understanding. Lab appt scheduled.  ------   Notes recorded by Consuelo Pandy, PA-C on 01/18/2019 at 4:04 PM EST  Scr stable. K low at 3.4. Add 20 mEq of Kdur on days that he takes Lasix (every other day). Repeat BMP in 7 days.

## 2019-01-19 NOTE — Progress Notes (Signed)
This service is provided via telemedicine  No vital signs collected/recorded due to the encounter was a telemedicine visit.   Location of patient (ex: home, work):  Home  Patient consents to a telephone visit:  Yes  Location of the provider (ex: office, home):  Office   Name of any referring provider:  Hollace Kinnier, [place] of all persons participating in the telemedicine service and their role in the encounter:  Marlowe Sax, NP, Ruthell Rummage CMA, and Jeneen Rinks   Time spent on call:  Ruthell Rummage CMA spent 12 minutes on phone

## 2019-01-19 NOTE — Telephone Encounter (Signed)
-----   Message from Consuelo Pandy, [place] sent at 01/18/2019  4:04 PM EST ----- Scr stable. K low at 3.4. Add 20 mEq of Kdur on days that he takes Lasix (every other day). Repeat BMP in 7 days.

## 2019-01-25 ENCOUNTER — Telehealth (HOSPITAL_COMMUNITY): Payer: Self-pay

## 2019-01-25 NOTE — Telephone Encounter (Signed)
Received a Request for Medical records from [facility]. Faxed Records to [phone].

## 2019-01-27 ENCOUNTER — Encounter: Payer: Self-pay | Admitting: Family

## 2019-01-27 ENCOUNTER — Other Ambulatory Visit (HOSPITAL_COMMUNITY): Payer: Self-pay

## 2019-01-27 ENCOUNTER — Ambulatory Visit: Payer: Self-pay

## 2019-01-27 ENCOUNTER — Other Ambulatory Visit (HOSPITAL_COMMUNITY): Payer: Medicare Other

## 2019-01-27 NOTE — Progress Notes (Signed)
Paramedicine Encounter    Patient ID: DETRELL UMSCHEID, male    DOB: November 25, 1945, 73 y.o.   MRN: [id number]    Patient Care Team: Gayland Curry, DO as PCP - General (Geriatric Medicine) Martinique, Peter M, MD as PCP - Cardiology (Cardiology) Larey Dresser, MD as PCP - Advanced Heart Failure (Cardiology) Elmarie Shiley, MD (Nephrology) Milus Banister, MD (Gastroenterology)  Patient Active Problem List   Diagnosis Date Noted  . Metabolic acidosis 27/25/3664  . Orthostasis 09/07/2018  . AKI (acute kidney injury) ([place])   . Immunosuppressed status ([place])   . Macrocytic anemia   . Chronic combined systolic and diastolic congestive heart failure ([place]) 06/03/2018  . Candida esophagitis ([place]) 09/22/2017  . History of smoking 30 or more pack years 01/05/2017  . Bipolar 1 disorder ([place])   . BPH (benign prostatic hyperplasia) 03/31/2013  . 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 Essential hypertension, benign 03/31/2013  . Acute renal failure superimposed on stage 3 chronic kidney disease ([place]) 06/04/2012  . 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[place]) 01/23/2010    Current Outpatient Medications:  .  allopurinol (ZYLOPRIM) 100 MG tablet, Take 2 tablets (200 mg total) by mouth daily., Disp: 60 tablet, Rfl: 5 .  Cholecalciferol (VITAMIN D) 125 MCG (5000 UT) CAPS, Take 5,000 Units by mouth daily. , Disp: , Rfl:  .  divalproex (DEPAKOTE) 500 MG EC tablet, Take 1,500 mg by mouth at bedtime. , Disp: , Rfl:  .  epoetin alfa (PROCRIT) 40347 UNIT/ML injection, Inject 10,000 Units into the skin every 30 (thirty) days. , Disp: , Rfl:  .  ferrous sulfate 325 (65 FE) MG tablet, Take 325 mg by mouth every Monday, Wednesday, and Friday. , Disp: , Rfl:  .  furosemide (LASIX) 20 MG tablet, Take 1 tablet (20 mg total) by mouth every other day. (Patient taking differently: Take 20 mg by mouth every Monday, Wednesday, and Friday. ), Disp: 45 tablet, Rfl: 3 .  hydrALAZINE (APRESOLINE) 25 MG tablet, Take 1 tablet (25 mg  total) by mouth 2 (two) times daily., Disp: 60 tablet, Rfl: 3 .  isosorbide mononitrate (IMDUR) 30 MG 24 hr tablet, Take 1 tablet (30 mg total) by mouth daily., Disp: 90 tablet, Rfl: 3 .  loperamide (IMODIUM) 2 MG capsule, Take 1 capsule (2 mg total) by mouth 4 (four) times daily as needed for diarrhea or loose stools., Disp: 12 capsule, Rfl: 0 .  metoprolol succinate (TOPROL-XL) 25 MG 24 hr tablet, Take 1 tablet (25 mg total) by mouth at bedtime., Disp: 90 tablet, Rfl: 3 .  OLANZapine (ZYPREXA) 7.5 MG tablet, Take 1 tablet (7.5 mg total) by mouth at bedtime., Disp: 30 tablet, Rfl: 11 .  omeprazole (PRILOSEC) 20 MG capsule, Take 20 mg by mouth daily before breakfast. , Disp: , Rfl:  .  tamsulosin (FLOMAX) 0.4 MG CAPS capsule, Take 0.4 mg by mouth daily., Disp: , Rfl:  .  inFLIXimab (REMICADE) 100 MG injection, Infuse Remicade IV schedule 1 30m/kg every 8 weeks Premedicate with Tylenol 500-6548mby mouth and Benadryl 25-5042my mouth prior to infusion. Last PPD was on 12/2009.  (Patient taking differently: Infuse Remicade IV schedule 1 5mg64m every 8 weeks Premedicate with Tylenol 500-650mg29mmouth and Benadryl 25-50mg 41mouth prior to infusion. Last PPD was on 12/2009.), Disp: 1 each, Rfl: 6 .  ivabradine (CORLANOR) 5 MG TABS tablet, Take 1 tablet (5 mg total) by mouth 2 (two) times daily with a meal., Disp: 180 tablet, Rfl:  3 .  magnesium oxide (MAG-OX) 400 MG tablet, Take 400 mg by mouth every Monday, Wednesday, and Friday., Disp: , Rfl:  .  vitamin B-12 (CYANOCOBALAMIN) 1000 MCG tablet, Take 1,000 mcg by mouth daily., Disp: , Rfl:  Allergies  Allergen Reactions  . Azathioprine Other (See Comments)    REACTION: affected WBC "Almost died"  . Ciprofloxacin Other (See Comments)    Unknown rxn  . Levaquin [Levofloxacin In D5w] Other (See Comments)    Unknown rxn  . Plendil [Felodipine] Other (See Comments)    Unknown rxn     Social History   Socioeconomic History  . Marital status:  Divorced    Spouse name: Not on file  . Number of children: 1  . Years of education: Not on file  . Highest education level: Not on file  Occupational History  . Occupation: retired  . Occupation: [employer]  . Financial resource strain: Not hard at all  . Food insecurity    Worry: Never true    Inability: Never true  . Transportation needs    Medical: No    Non-medical: No  Tobacco Use  . Smoking status: Former Smoker    Packs/day: 1.00    Years: 49.00    Pack years: 49.00    Types: Cigarettes    Start date: 04/11/1956    Quit date: 04/17/2014    Years since quitting: 4.7  . Smokeless tobacco: Never Used  Substance and Sexual Activity  . Alcohol use: No    Alcohol/week: 0.0 standard drinks  . Drug use: No  . Sexual activity: Never  Lifestyle  . Physical activity    Days per week: 0 days    Minutes per session: 0 min  . Stress: Not at all  Relationships  . Social connections    Talks on phone: More than three times a week    Gets together: Three times a week    Attends religious service: More than 4 times per year    Active member of club or organization: Yes    Attends meetings of clubs or organizations: 1 to 4 times per year    Relationship status: Divorced  . Intimate partner violence    Fear of current or ex partner: No    Emotionally abused: No    Physically abused: No    Forced sexual activity: No  Other Topics Concern  . Not on file  Social History Narrative  . Not on file    Physical Exam Pulmonary:     Effort: Pulmonary effort is normal. No respiratory distress.     Breath sounds: No wheezing or rales.  Abdominal:     General: There is no distension.  Musculoskeletal:     Right lower leg: No edema.     Left lower leg: No edema.  Skin:    General: Skin is warm.         Future Appointments  Date Time Provider [place]  02/07/2019  9:00 AM [facility] RM 1 [facility] None  02/08/2019  2:00 PM [facility] PHASE II ORIENT  [facility] None  02/14/2019  1:45 PM [facility] PHASE II EXC [facility] None  02/14/2019  2:15 PM Reed, Tiffany L, DO PSC-[facility] None  02/16/2019  1:45 PM [facility] PHASE II EXC [facility] None  02/21/2019  1:45 PM [facility] PHASE II EXC [facility] None  02/23/2019  1:45 PM [facility] PHASE II EXC [facility] None  02/28/2019  1:45 PM [facility] PHASE II EXC [facility] None  03/02/2019  1:45 PM [facility] PHASE II EXC [facility] None  03/04/2019  1:45 PM [facility] PHASE II EXC [facility] None  03/07/2019  1:45 PM [facility]-CRE[facility] PHASE II EXC [facility] None  03/09/2019  1:45 PM [facility]-CRE[facility] PHASE II EXC [facility] None  03/11/2019  1:45 PM [facility]-CRE[facility] PHASE II EXC [facility] None  03/14/2019  1:45 PM MC-CREHA PHASE II EXC [facility] None  03/16/2019  1:45 PM MC-CRE[facility] PHASE II EXC [facility] None  03/18/2019  1:45 PM MC-CRE[facility] PHASE II EXC [facility] None  03/21/2019  1:45 PM [facility]-CRE[facility] PHASE II EXC [facility] None  03/23/2019  1:45 PM [facility]-CRE[facility] PHASE II EXC [facility]RE[facility] None  03/25/2019  1:45 PM [facility]-CRE[facility] PHASE II EXC [facility] None  03/28/2019  1:45 PM [facility]-CRE[facility] PHASE II EXC [facility] None  03/30/2019  1:45 PM [facility]-CRE[facility] PHASE II EXC [facility] None  04/01/2019  1:45 PM [facility] PHASE II EXC [facility] None  04/04/2019  1:45 PM [facility] PHASE II EXC [facility] None  04/06/2019  1:45 PM [facility] PHASE II EXC [facility] None  04/08/2019  1:45 PM [facility] PHASE II EXC [facility] None  04/19/2019 10:00 AM Larey Dresser, MD [facility] None  01/20/2020  2:15 PM [name], [name], NP PSC-[facility] None     BP 112/72 (BP Location: Right Arm, Patient Position: Sitting, Cuff Size: Normal)   Pulse 71   Temp 99.1 F (37.3 C)   Wt 165 lb (74.8 kg)   SpO2 98%   BMI 20.08 kg/m    ATF pt CAO to his norm sitting in the living room watching tv.  He said that his bestfriend past away early this morning and he's extremely sad. He's taken his medications today but he doesn't have an appetite.  He said that his son said that he'll be by later to sit with him a while.  Pt denies sob, chest pain and dizziness. rx  bottles verified and pill box refilled. He was unable to get his weight today because the scale was broken.  He said that the staff at [facility] will probably call him later about that.    Medication ordered: Potassium   Danella Philson, EMT Paramedic [phone] 01/28/2019    ACTION: Home visit completed

## 2019-01-28 ENCOUNTER — Telehealth (HOSPITAL_COMMUNITY): Payer: Self-pay | Admitting: Pharmacist

## 2019-01-28 ENCOUNTER — Ambulatory Visit (HOSPITAL_COMMUNITY)
Admission: RE | Admit: 2019-01-28 | Discharge: 2019-01-28 | Disposition: A | Discharge status: Home / Self Care | Payer: Medicare Other | Source: Ambulatory Visit | Attending: Internal Medicine | Admitting: Internal Medicine

## 2019-01-28 ENCOUNTER — Other Ambulatory Visit: Payer: Self-pay

## 2019-01-28 DIAGNOSIS — I5022 Chronic systolic (congestive) heart failure: Secondary | ICD-10-CM | POA: Diagnosis not present

## 2019-01-28 LAB — BASIC METABOLIC PANEL
Anion gap: 8 (ref 5–15)
BUN: 26 mg/dL — ABNORMAL HIGH (ref 8–23)
CO2: 25 mmol/L (ref 22–32)
Calcium: 8.8 mg/dL — ABNORMAL LOW (ref 8.9–10.3)
Chloride: 111 mmol/L (ref 98–111)
Creatinine, Ser: 1.76 mg/dL — ABNORMAL HIGH (ref 0.61–1.24)
GFR calc Af Amer: 43 mL/min — ABNORMAL LOW (ref >60)
GFR calc non Af Amer: 38 mL/min — ABNORMAL LOW (ref >60)
Glucose, Bld: 100 mg/dL — ABNORMAL HIGH (ref 70–99)
Potassium: 4.1 mmol/L (ref 3.5–5.1)
Sodium: 144 mmol/L (ref 135–145)

## 2019-01-28 NOTE — Telephone Encounter (Signed)
Cardiac Rehab Medication Review by a Pharmacist  Does the patient  feel that his/her medications are working for him/her?  Yes, the patients believes that the medications are working.  Has the patient been experiencing any side effects to the medications prescribed?  Patient reports no side effects  Does the patient measure his/her own blood pressure or blood glucose at home?  Yes checks BP daily - BP readings on 12/04 was 101/75. Readings are stored on an app that providers can see. Patient does not have diabetes.  Does the patient have any problems obtaining medications due to transportation or finances?   No issues with obtaining medications  Understanding of regimen: excellent Understanding of indications: excellent Potential of compliance: good - Misses doses about twice a month.    Pharmacist comments: Please confirm if patient should still be taking potassium chloride 10 mEq or not. Patient is unsure if he should be taking it and has not been taking it recently to this knowledge.   Patient unable to confirm doses of Remicade and Epoetin alfa    Werner Lean 01/28/2019 4:56 PM

## 2019-02-02 ENCOUNTER — Other Ambulatory Visit (HOSPITAL_COMMUNITY): Payer: Self-pay

## 2019-02-02 NOTE — Progress Notes (Signed)
Paramedicine Encounter    Patient ID: Brent [name], male    DOB: [birth year], 73 y.o.   MRN: [id number]    Patient Care Team: Gayland Curry, DO as PCP - General (Geriatric Medicine) [place]ique, Peter M, MD as PCP - Cardiology (Cardiology) Larey Dresser, MD as PCP - Advanced Heart Failure (Cardiology) Elmarie Shiley, MD (Nephrology) Milus Banister, MD (Gastroenterology)  Patient Active Problem List   Diagnosis Date Noted  . Metabolic acidosis 40/98/1191  . Orthostasis 09/07/2018  . AKI (acute kidney injury) ([place])   . Immunosuppressed status ([place])   . Macrocytic anemia   . Chronic combined systolic and diastolic congestive heart failure ([place]) 06/03/2018  . Candida esophagitis ([place]) 09/22/2017  . History of smoking 30 or more pack years 01/05/2017  . Bipolar 1 disorder ([place])   . BPH (benign prostatic hyperplasia) 03/31/2013  . 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 Essential hypertension, benign 03/31/2013  . Acute renal failure superimposed on stage 3 chronic kidney disease ([place]) 06/04/2012  . 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[place]) 01/23/2010    Current Outpatient Medications:  .  allopurinol (ZYLOPRIM) 100 MG tablet, Take 2 tablets (200 mg total) by mouth daily., Disp: 60 tablet, Rfl: 5 .  Cholecalciferol (VITAMIN D) 125 MCG (5000 UT) CAPS, Take 5,000 Units by mouth daily. , Disp: , Rfl:  .  divalproex (DEPAKOTE) 500 MG EC tablet, Take 1,500 mg by mouth at bedtime. , Disp: , Rfl:  .  ferrous sulfate 325 (65 FE) MG tablet, Take 325 mg by mouth every Monday, Wednesday, and Friday. , Disp: , Rfl:  .  furosemide (LASIX) 20 MG tablet, Take 1 tablet (20 mg total) by mouth every other day. (Patient taking differently: Take 20 mg by mouth every Monday, Wednesday, and Friday. ), Disp: 45 tablet, Rfl: 3 .  hydrALAZINE (APRESOLINE) 25 MG tablet, Take 1 tablet (25 mg total) by mouth 2 (two) times daily., Disp: 60 tablet, Rfl: 3 .  isosorbide mononitrate (IMDUR) 30 MG 24 hr tablet, Take 1  tablet (30 mg total) by mouth daily., Disp: 90 tablet, Rfl: 3 .  ivabradine (CORLANOR) 5 MG TABS tablet, Take 1 tablet (5 mg total) by mouth 2 (two) times daily with a meal., Disp: 180 tablet, Rfl: 3 .  magnesium oxide (MAG-OX) 400 MG tablet, Take 400 mg by mouth every Monday, Wednesday, and Friday., Disp: , Rfl:  .  metoprolol succinate (TOPROL-XL) 25 MG 24 hr tablet, Take 1 tablet (25 mg total) by mouth at bedtime., Disp: 90 tablet, Rfl: 3 .  OLANZapine (ZYPREXA) 7.5 MG tablet, Take 1 tablet (7.5 mg total) by mouth at bedtime., Disp: 30 tablet, Rfl: 11 .  omeprazole (PRILOSEC) 20 MG capsule, Take 20 mg by mouth daily before breakfast. , Disp: , Rfl:  .  tamsulosin (FLOMAX) 0.4 MG CAPS capsule, Take 0.4 mg by mouth daily., Disp: , Rfl:  .  vitamin B-12 (CYANOCOBALAMIN) 1000 MCG tablet, Take 1,000 mcg by mouth daily., Disp: , Rfl:  .  epoetin alfa (PROCRIT) 47829 UNIT/ML injection, Inject 10,000 Units into the skin every 30 (thirty) days. , Disp: , Rfl:  .  inFLIXimab (REMICADE) 100 MG injection, Infuse Remicade IV schedule 1 89m/kg every 8 weeks Premedicate with Tylenol 500-6566mby mouth and Benadryl 25-5026my mouth prior to infusion. Last PPD was on 12/2009.  (Patient taking differently: Infuse Remicade IV schedule 1 5mg28m every 8 weeks Premedicate with Tylenol 500-650mg49mmouth and Benadryl 25-50mg 17mouth prior to infusion.  Last PPD was on 12/2009.), Disp: 1 each, Rfl: 6 .  loperamide (IMODIUM) 2 MG capsule, Take 1 capsule (2 mg total) by mouth 4 (four) times daily as needed for diarrhea or loose stools., Disp: 12 capsule, Rfl: 0 Allergies  Allergen Reactions  . Azathioprine Other (See Comments)    REACTION: affected WBC "Almost died"  . Ciprofloxacin Other (See Comments)    Unknown rxn  . Levaquin [Levofloxacin In D5w] Other (See Comments)    Unknown rxn  . Plendil [Felodipine] Other (See Comments)    Unknown rxn     Social History   Socioeconomic History  . Marital status:  Divorced    Spouse name: Not on file  . Number of children: 1  . Years of education: Not on file  . Highest education level: Not on file  Occupational History  . Occupation: retired  . Occupation: Veteran  Tobacco Use  . Smoking status: Former Smoker    Packs/day: 1.00    Years: 49.00    Pack years: 49.00    Types: Cigarettes    Start date: 04/11/1956    Quit date: 04/17/2014    Years since quitting: 4.8  . Smokeless tobacco: Never Used  Substance and Sexual Activity  . Alcohol use: No    Alcohol/week: 0.0 standard drinks  . Drug use: No  . Sexual activity: Never  Other Topics Concern  . Not on file  Social History Narrative  . Not on file   Social Determinants of Health   Financial Resource Strain: Low Risk   . Difficulty of Paying Living Expenses: Not hard at all  Food Insecurity: No Food Insecurity  . Worried About Charity fundraiser in the Last Year: Never true  . Ran Out of Food in the Last Year: Never true  Transportation Needs: No Transportation Needs  . Lack of Transportation (Medical): No  . Lack of Transportation (Non-Medical): No  Physical Activity: Inactive  . Days of Exercise per Week: 0 days  . Minutes of Exercise per Session: 0 min  Stress: No Stress Concern Present  . Feeling of Stress : Not at all  Social Connections: Slightly Isolated  . Frequency of Communication with Friends and Family: More than three times a week  . Frequency of Social Gatherings with Friends and Family: Three times a week  . Attends Religious Services: More than 4 times per year  . Active Member of Clubs or Organizations: Yes  . Attends Archivist Meetings: 1 to 4 times per year  . Marital Status: Divorced  Human resources officer Violence: Not At Risk  . Fear of Current or Ex-Partner: No  . Emotionally Abused: No  . Physically Abused: No  . Sexually Abused: No    Physical Exam Pulmonary:     Effort: No respiratory distress.     Breath sounds: No wheezing or rales.   Abdominal:     General: There is no distension.     Tenderness: There is no abdominal tenderness.  Musculoskeletal:     Right lower leg: No edema.     Left lower leg: No edema.  Skin:    General: Skin is warm and dry.         Future Appointments  Date Time Provider [place]  02/07/2019  9:00 AM [facility] RM 1 [facility] None  02/08/2019  2:00 PM [facility]-CARDI[facility] PHASE II ORIENT [facility] None  02/14/2019  1:45 PM [facility] PHASE II EXC [facility] None  02/14/2019  2:15 PM Reed,  Tiffany L, DO P[facility] None  02/16/2019  1:45 PM [facility] PHASE II EXC [facility] None  02/21/2019  1:45 PM [facility] PHASE II EXC [facility] None  02/23/2019  1:45 PM [facility] PHASE II EXC [facility] None  02/28/2019  1:45 PM [facility] PHASE II EXC [facility] None  03/02/2019  1:45 PM [facility] PHASE II EXC [facility] None  03/04/2019  1:45 PM [facility] PHASE II EXC [facility] None  03/07/2019  1:45 PM [facility] PHASE II EXC [facility] None  03/09/2019  1:45 PM [facility] PHASE II EXC [facility] None  03/11/2019  1:45 PM [facility] PHASE II EXC [facility] None  03/14/2019  1:45 PM [facility] PHASE II EXC [facility] None  03/16/2019  1:45 PM MC-CRE[facility] PHASE II EXC [facility] None  03/18/2019  1:45 PM [facility] PHASE II EXC [facility] None  03/21/2019  1:45 PM [facility] PHASE II EXC [facility] None  03/23/2019  1:45 PM [facility] PHASE II EXC [facility] None  03/25/2019  1:45 PM [facility] PHASE II EXC [facility] None  03/28/2019  1:45 PM [facility] PHASE II EXC [facility] None  03/30/2019  1:45 PM [facility] PHASE II EXC [facility] None  04/01/2019  1:45 PM [facility]-CRE[facility] PHASE II EXC [facility] None  04/04/2019  1:45 PM [facility]-CRE[facility] PHASE II EXC [facility] None  04/06/2019  1:45 PM [facility] PHASE II EXC [facility] None  04/08/2019  1:45 PM [facility] PHASE II EXC [facility] None  04/19/2019 10:00 AM Larey Dresser, MD [facility] None  01/20/2020  2:15 PM [name], Dinah C, NP [facility] None     BP 130/84 (BP Location: Right Arm, Patient Position: Sitting, Cuff Size: Normal)   Pulse 83   Temp 98.6 F  (37 C)   Resp 15   Wt 167 lb (75.8 kg)   SpO2 96%   BMI 20.33 kg/m   Weight yesterday-167 Last visit weight-his scale was not working last visit  ATF pt CAO x4 sitting in the living room watching tv with no complaints. He's taken all of is medications for the week.  He denies sob, chest pain and dizziness.  He had diarrhea yesterday and last week; loperamide was placed in his pill box daily.  I explained to him the reason why he should take it and he agrees.  He has no other complaints at this time.  rx bottles verified and pill box refilled.    His pcp at [place] is now in [place]; his med refills still come from [place]  Pt's taking alluprinol 2 pills daily  Medication ordered: Olanzapine  Marquise Wicke, EMT Paramedic [phone] 02/03/2019    ACTION: Home visit completed

## 2019-02-07 ENCOUNTER — Telehealth (HOSPITAL_COMMUNITY): Payer: Self-pay | Admitting: *Deleted

## 2019-02-07 ENCOUNTER — Ambulatory Visit (HOSPITAL_COMMUNITY)
Admission: RE | Admit: 2019-02-07 | Discharge: 2019-02-07 | Disposition: A | Discharge status: Home / Self Care | Payer: Medicare Other | Source: Ambulatory Visit | Attending: Nephrology | Admitting: Nephrology

## 2019-02-07 ENCOUNTER — Other Ambulatory Visit: Payer: Self-pay

## 2019-02-07 DIAGNOSIS — D631 Anemia in chronic kidney disease: Secondary | ICD-10-CM | POA: Insufficient documentation

## 2019-02-07 DIAGNOSIS — N183 Chronic kidney disease, stage 3 unspecified: Secondary | ICD-10-CM | POA: Insufficient documentation

## 2019-02-07 LAB — IRON AND TIBC
Iron: 80 ug/dL (ref 45–182)
Saturation Ratios: 39 % (ref 17.9–39.5)
TIBC: 203 ug/dL — ABNORMAL LOW (ref 250–450)
UIBC: 123 ug/dL

## 2019-02-07 LAB — HEMOGLOBIN AND HEMATOCRIT, BLOOD
HCT: 35.3 % — ABNORMAL LOW (ref 39.0–52.0)
Hemoglobin: 10.6 g/dL — ABNORMAL LOW (ref 13.0–17.0)

## 2019-02-07 LAB — FERRITIN: Ferritin: 709 ng/mL — ABNORMAL HIGH (ref 24–336)

## 2019-02-07 MED ORDER — EPOETIN ALFA 10000 UNIT/ML IJ SOLN
10000.0000 [IU] | INTRAMUSCULAR | Status: DC
  Administered 2019-02-07: 10000 [IU] via SUBCUTANEOUS
  Filled 2019-02-07: qty 1

## 2019-02-07 NOTE — Progress Notes (Signed)
PATIENT CARE CENTER NOTE  Diagnosis:Anemia associated with Chronic Renal Failure, anemia associated with renal disease   Provider:Patel, Ulice Dash MD   Procedure:Epoetin Alfa (Procrit) injection   Note:Patient received sub-qProcritinjection inleft arm. Tolerated well. Labs drawn pre-injection and Hemoglobin was10.6. Patient BP was128/79.Discharge instructions given. Patient alert, oriented and ambulatoryat discharge.

## 2019-02-07 NOTE — Discharge Instructions (Signed)

## 2019-02-08 ENCOUNTER — Ambulatory Visit (HOSPITAL_COMMUNITY): Payer: Medicare Other

## 2019-02-09 ENCOUNTER — Other Ambulatory Visit (HOSPITAL_COMMUNITY): Payer: Self-pay

## 2019-02-09 NOTE — Progress Notes (Signed)
Paramedicine Encounter    Patient ID: Brent [name], male    DOB: 01/15/1946, 73 y.o.   MRN: [id number]    Patient Care Team: Gayland Curry, DO as PCP - General (Geriatric Medicine) Martinique, Peter M, MD as PCP - Cardiology (Cardiology) Larey Dresser, MD as PCP - Advanced Heart Failure (Cardiology) Elmarie Shiley, MD (Nephrology) Milus Banister, MD (Gastroenterology)  Patient Active Problem List   Diagnosis Date Noted  . Metabolic acidosis 63/02/6008  . Orthostasis 09/07/2018  . AKI (acute kidney injury) ([place])   . Immunosuppressed status ([place])   . Macrocytic anemia   . Chronic combined systolic and diastolic congestive heart failure ([place]) 06/03/2018  . Candida esophagitis ([place]) 09/22/2017  . History of smoking 30 or more pack years 01/05/2017  . Bipolar 1 disorder ([place])   . BPH (benign prostatic hyperplasia) 03/31/2013  . 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 Essential hypertension, benign 03/31/2013  . Acute renal failure superimposed on stage 3 chronic kidney disease ([place]) 06/04/2012  . 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[place]) 01/23/2010    Current Outpatient Medications:  .  allopurinol (ZYLOPRIM) 100 MG tablet, Take 2 tablets (200 mg total) by mouth daily., Disp: 60 tablet, Rfl: 5 .  Cholecalciferol (VITAMIN D) 125 MCG (5000 UT) CAPS, Take 5,000 Units by mouth daily. , Disp: , Rfl:  .  divalproex (DEPAKOTE) 500 MG EC tablet, Take 1,500 mg by mouth at bedtime. , Disp: , Rfl:  .  ferrous sulfate 325 (65 FE) MG tablet, Take 325 mg by mouth every Monday, Wednesday, and Friday. , Disp: , Rfl:  .  furosemide (LASIX) 20 MG tablet, Take 1 tablet (20 mg total) by mouth every other day. (Patient taking differently: Take 20 mg by mouth every Monday, Wednesday, and Friday. ), Disp: 45 tablet, Rfl: 3 .  hydrALAZINE (APRESOLINE) 25 MG tablet, Take 1 tablet (25 mg total) by mouth 2 (two) times daily., Disp: 60 tablet, Rfl: 3 .  isosorbide mononitrate (IMDUR) 30 MG 24 hr tablet, Take 1  tablet (30 mg total) by mouth daily., Disp: 90 tablet, Rfl: 3 .  ivabradine (CORLANOR) 5 MG TABS tablet, Take 1 tablet (5 mg total) by mouth 2 (two) times daily with a meal., Disp: 180 tablet, Rfl: 3 .  loperamide (IMODIUM) 2 MG capsule, Take 1 capsule (2 mg total) by mouth 4 (four) times daily as needed for diarrhea or loose stools., Disp: 12 capsule, Rfl: 0 .  magnesium oxide (MAG-OX) 400 MG tablet, Take 400 mg by mouth every Monday, Wednesday, and Friday., Disp: , Rfl:  .  metoprolol succinate (TOPROL-XL) 25 MG 24 hr tablet, Take 1 tablet (25 mg total) by mouth at bedtime., Disp: 90 tablet, Rfl: 3 .  OLANZapine (ZYPREXA) 7.5 MG tablet, Take 1 tablet (7.5 mg total) by mouth at bedtime., Disp: 30 tablet, Rfl: 11 .  omeprazole (PRILOSEC) 20 MG capsule, Take 20 mg by mouth daily before breakfast. , Disp: , Rfl:  .  tamsulosin (FLOMAX) 0.4 MG CAPS capsule, Take 0.4 mg by mouth daily., Disp: , Rfl:  .  vitamin B-12 (CYANOCOBALAMIN) 1000 MCG tablet, Take 1,000 mcg by mouth daily., Disp: , Rfl:  .  epoetin alfa (PROCRIT) 93235 UNIT/ML injection, Inject 10,000 Units into the skin every 30 (thirty) days. , Disp: , Rfl:  .  inFLIXimab (REMICADE) 100 MG injection, Infuse Remicade IV schedule 1 80m/kg every 8 weeks Premedicate with Tylenol 500-6538mby mouth and Benadryl 25-5035my mouth prior to infusion. Last  PPD was on 12/2009.  (Patient taking differently: Infuse Remicade IV schedule 1 25m/kg every 8 weeks Premedicate with Tylenol 500-6546mby mouth and Benadryl 25-5048my mouth prior to infusion. Last PPD was on 12/2009.), Disp: 1 each, Rfl: 6 Allergies  Allergen Reactions  . Azathioprine Other (See Comments)    REACTION: affected WBC "Almost died"  . Ciprofloxacin Other (See Comments)    Unknown rxn  . Levaquin [Levofloxacin In D5w] Other (See Comments)    Unknown rxn  . Plendil [Felodipine] Other (See Comments)    Unknown rxn     Social History   Socioeconomic History  . Marital status:  Divorced    Spouse name: Not on file  . Number of children: 1  . Years of education: Not on file  . Highest education level: Not on file  Occupational History  . Occupation: retired  . Occupation: Veteran  Tobacco Use  . Smoking status: Former Smoker    Packs/day: 1.00    Years: 49.00    Pack years: 49.00    Types: Cigarettes    Start date: 04/11/1956    Quit date: 04/17/2014    Years since quitting: 4.8  . Smokeless tobacco: Never Used  Substance and Sexual Activity  . Alcohol use: No    Alcohol/week: 0.0 standard drinks  . Drug use: No  . Sexual activity: Never  Other Topics Concern  . Not on file  Social History Narrative  . Not on file   Social Determinants of Health   Financial Resource Strain:   . Difficulty of Paying Living Expenses: Not on file  Food Insecurity:   . Worried About RunCharity fundraiser the Last Year: Not on file  . Ran Out of Food in the Last Year: Not on file  Transportation Needs:   . Lack of Transportation (Medical): Not on file  . Lack of Transportation (Non-Medical): Not on file  Physical Activity:   . Days of Exercise per Week: Not on file  . Minutes of Exercise per Session: Not on file  Stress:   . Feeling of Stress : Not on file  Social Connections:   . Frequency of Communication with Friends and Family: Not on file  . Frequency of Social Gatherings with Friends and Family: Not on file  . Attends Religious Services: Not on file  . Active Member of Clubs or Organizations: Not on file  . Attends CluArchivistetings: Not on file  . Marital Status: Not on file  Intimate Partner Violence:   . Fear of Current or Ex-Partner: Not on file  . Emotionally Abused: Not on file  . Physically Abused: Not on file  . Sexually Abused: Not on file    Physical Exam      Future Appointments  Date Time Provider [place]2/21/2020  2:15 PM ReeHollace Kinnier DO [facility] None  03/10/2019  8:00 AM [facility] RM 1 [facility] None   04/19/2019 10:00 AM McLLarey DresserD [facility] None  01/20/2020  2:15 PM [name], DinNelda BucksP [facility] None     BP 126/74 (BP Location: Right Arm, Patient Position: Sitting, Cuff Size: Normal)   Pulse 91   Wt 168 lb (76.2 kg)   SpO2 98%   BMI 20.45 kg/m   Weight yesterday-168 Last visit weight-167  ATF pt CAO x4 sitting in the living room watching tv. He said that he went out to eat last night and ate taco. He denies sob, chest pain  and dizziness.  He's taken all of his medications without missing any.  We discussed the food that he's eating and he agreed to limit the amount of sodium he eats.  He seems to be in good spirits although his good friend recently passed away.  rx bottles verified and his pill box is refilled.    Medication ordered: none Sanaz Scarlett, EMT Paramedic [phone] 02/13/2019    ACTION: Home visit completed

## 2019-02-14 ENCOUNTER — Ambulatory Visit: Payer: Medicare Other | Admitting: Internal Medicine

## 2019-02-14 ENCOUNTER — Ambulatory Visit (HOSPITAL_COMMUNITY): Payer: Medicare Other

## 2019-02-15 ENCOUNTER — Other Ambulatory Visit (HOSPITAL_COMMUNITY): Payer: Self-pay

## 2019-02-15 NOTE — Progress Notes (Signed)
Paramedicine Encounter    Patient ID: Brent [name], male    DOB: [birth year], 73 y.o.   MRN: [id number]    Patient Care Team: Gayland Curry, DO as PCP - General (Geriatric Medicine) Martinique, Peter M, MD as PCP - Cardiology (Cardiology) Larey Dresser, MD as PCP - Advanced Heart Failure (Cardiology) Elmarie Shiley, MD (Nephrology) Milus Banister, MD (Gastroenterology)  Patient Active Problem List   Diagnosis Date Noted  . Metabolic acidosis 64/33/2951  . Orthostasis 09/07/2018  . AKI (acute kidney injury) ([place])   . Immunosuppressed status ([place])   . Macrocytic anemia   . Chronic combined systolic and diastolic congestive heart failure ([place]) 06/03/2018  . Candida esophagitis ([place]) 09/22/2017  . History of smoking 30 or more pack years 01/05/2017  . Bipolar 1 disorder ([place])   . BPH (benign prostatic hyperplasia) 03/31/2013  . 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 Essential hypertension, benign 03/31/2013  . Acute renal failure superimposed on stage 3 chronic kidney disease ([place]) 06/04/2012  . 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[place]) 01/23/2010    Current Outpatient Medications:  .  allopurinol (ZYLOPRIM) 100 MG tablet, Take 2 tablets (200 mg total) by mouth daily., Disp: 60 tablet, Rfl: 5 .  Cholecalciferol (VITAMIN D) 125 MCG (5000 UT) CAPS, Take 5,000 Units by mouth daily. , Disp: , Rfl:  .  divalproex (DEPAKOTE) 500 MG EC tablet, Take 1,500 mg by mouth at bedtime. , Disp: , Rfl:  .  ferrous sulfate 325 (65 FE) MG tablet, Take 325 mg by mouth every Monday, Wednesday, and Friday. , Disp: , Rfl:  .  furosemide (LASIX) 20 MG tablet, Take 1 tablet (20 mg total) by mouth every other day. (Patient taking differently: Take 20 mg by mouth every Monday, Wednesday, and Friday. ), Disp: 45 tablet, Rfl: 3 .  hydrALAZINE (APRESOLINE) 25 MG tablet, Take 1 tablet (25 mg total) by mouth 2 (two) times daily., Disp: 60 tablet, Rfl: 3 .  isosorbide mononitrate (IMDUR) 30 MG 24 hr tablet, Take 1  tablet (30 mg total) by mouth daily., Disp: 90 tablet, Rfl: 3 .  ivabradine (CORLANOR) 5 MG TABS tablet, Take 1 tablet (5 mg total) by mouth 2 (two) times daily with a meal., Disp: 180 tablet, Rfl: 3 .  magnesium oxide (MAG-OX) 400 MG tablet, Take 400 mg by mouth every Monday, Wednesday, and Friday., Disp: , Rfl:  .  metoprolol succinate (TOPROL-XL) 25 MG 24 hr tablet, Take 1 tablet (25 mg total) by mouth at bedtime., Disp: 90 tablet, Rfl: 3 .  OLANZapine (ZYPREXA) 7.5 MG tablet, Take 1 tablet (7.5 mg total) by mouth at bedtime., Disp: 30 tablet, Rfl: 11 .  omeprazole (PRILOSEC) 20 MG capsule, Take 20 mg by mouth daily before breakfast. , Disp: , Rfl:  .  tamsulosin (FLOMAX) 0.4 MG CAPS capsule, Take 0.4 mg by mouth daily., Disp: , Rfl:  .  vitamin B-12 (CYANOCOBALAMIN) 1000 MCG tablet, Take 1,000 mcg by mouth daily., Disp: , Rfl:  .  epoetin alfa (PROCRIT) 88416 UNIT/ML injection, Inject 10,000 Units into the skin every 30 (thirty) days. , Disp: , Rfl:  .  inFLIXimab (REMICADE) 100 MG injection, Infuse Remicade IV schedule 1 58m/kg every 8 weeks Premedicate with Tylenol 500-6554mby mouth and Benadryl 25-5055my mouth prior to infusion. Last PPD was on 12/2009.  (Patient taking differently: Infuse Remicade IV schedule 1 5mg22m every 8 weeks Premedicate with Tylenol 500-650mg40mmouth and Benadryl 25-50mg 19mouth prior to infusion.  Last PPD was on 12/2009.), Disp: 1 each, Rfl: 6 .  loperamide (IMODIUM) 2 MG capsule, Take 1 capsule (2 mg total) by mouth 4 (four) times daily as needed for diarrhea or loose stools., Disp: 12 capsule, Rfl: 0 Allergies  Allergen Reactions  . Azathioprine Other (See Comments)    REACTION: affected WBC "Almost died"  . Ciprofloxacin Other (See Comments)    Unknown rxn  . Levaquin [Levofloxacin In D5w] Other (See Comments)    Unknown rxn  . Plendil [Felodipine] Other (See Comments)    Unknown rxn     Social History   Socioeconomic History  . Marital status:  Divorced    Spouse name: Not on file  . Number of children: 1  . Years of education: Not on file  . Highest education level: Not on file  Occupational History  . Occupation: retired  . Occupation: Veteran  Tobacco Use  . Smoking status: Former Smoker    Packs/day: 1.00    Years: 49.00    Pack years: 49.00    Types: Cigarettes    Start date: 04/11/1956    Quit date: 04/17/2014    Years since quitting: 4.8  . Smokeless tobacco: Never Used  Substance and Sexual Activity  . Alcohol use: No    Alcohol/week: 0.0 standard drinks  . Drug use: No  . Sexual activity: Never  Other Topics Concern  . Not on file  Social History Narrative  . Not on file   Social Determinants of Health   Financial Resource Strain:   . Difficulty of Paying Living Expenses: Not on file  Food Insecurity:   . Worried About Charity fundraiser in the Last Year: Not on file  . Ran Out of Food in the Last Year: Not on file  Transportation Needs:   . Lack of Transportation (Medical): Not on file  . Lack of Transportation (Non-Medical): Not on file  Physical Activity:   . Days of Exercise per Week: Not on file  . Minutes of Exercise per Session: Not on file  Stress:   . Feeling of Stress : Not on file  Social Connections:   . Frequency of Communication with Friends and Family: Not on file  . Frequency of Social Gatherings with Friends and Family: Not on file  . Attends Religious Services: Not on file  . Active Member of Clubs or Organizations: Not on file  . Attends Archivist Meetings: Not on file  . Marital Status: Not on file  Intimate Partner Violence:   . Fear of Current or Ex-Partner: Not on file  . Emotionally Abused: Not on file  . Physically Abused: Not on file  . Sexually Abused: Not on file    Physical Exam Pulmonary:     Effort: No respiratory distress.     Breath sounds: No wheezing.  Abdominal:     General: There is no distension.  Musculoskeletal:        General: No  swelling.     Right lower leg: No edema.     Left lower leg: No edema.  Skin:    General: Skin is warm and dry.         Future Appointments  Date Time Provider [employer]  03/10/2019  8:00 AM [facility] RM 1 [facility] None  04/19/2019 10:00 AM Larey Dresser, MD [facility] None  01/20/2020  2:15 PM [name], Dinah C, NP [facility] None     BP 138/84 (BP Location: Right Arm, Patient Position:  Sitting, Cuff Size: Normal)   Pulse 78   Resp 15   Wt 167 lb (75.8 kg)   BMI 20.33 kg/m   Weight yesterday-167 Last visit weight-167  ATF pt CAO x4 watching tv.  He said that he feels great and has a lot of energy now.  He said that he can feel the difference in his energy level since he's been taking his medications correctly.  He denies sob, chest pain and dizziness.  He's taken all of his medications for the week without missing any dose.  The machine to record his vitals in the morning wasn't working this morning. I had him to use it during our visit and it worked.  He sent the information to [facility] via the tablet. rx bottles verified and pill box refilled.   The toe that he had surgery on still looks good, no signs of infection or pain.   Medication ordered: None  Valori Hollenkamp, EMT Paramedic [phone] 02/16/2019    ACTION: Home visit completed

## 2019-02-16 ENCOUNTER — Ambulatory Visit (HOSPITAL_COMMUNITY): Payer: Medicare Other

## 2019-02-17 ENCOUNTER — Telehealth (HOSPITAL_COMMUNITY): Payer: Self-pay

## 2019-02-17 NOTE — Telephone Encounter (Signed)
Called and spoke with pt in regards to CR, advise pt that given the surge/increase in Covid-19 cases and hospitalizations our [facility] leadership has asked [name] to halt onsite Cardiac and Pulmonary rehab exercise sessions and move patients to the Virtual app we have with [facility]. The reason for this is so that department staff can be deployed to the Inpatient Nursing floors to assist those teams in need. Leadership anticipates this need will be 30-60 days however it could be extended if needed. Advised pt to attend Orientation Assessment  appt scheduled for 03/15/2019 @ 1:30PM.  At that time He will receive additional information about our [employer] Pt verbalized understanding and is in agreement.

## 2019-02-21 ENCOUNTER — Ambulatory Visit (HOSPITAL_COMMUNITY): Payer: Medicare Other

## 2019-02-22 ENCOUNTER — Other Ambulatory Visit (HOSPITAL_COMMUNITY): Payer: Self-pay

## 2019-02-22 NOTE — Progress Notes (Signed)
Paramedicine Encounter    Patient ID: Brent [name], male    DOB: 1945/07/01, 73 y.o.   MRN: [id number]    Patient Care Team: Gayland Curry, DO as PCP - General (Geriatric Medicine) Martinique, Peter M, MD as PCP - Cardiology (Cardiology) Larey Dresser, MD as PCP - Advanced Heart Failure (Cardiology) Elmarie Shiley, MD (Nephrology) Milus Banister, MD (Gastroenterology)  Patient Active Problem List   Diagnosis Date Noted  . Metabolic acidosis 78/29/5621  . Orthostasis 09/07/2018  . AKI (acute kidney injury) ([place])   . Immunosuppressed status ([place])   . Macrocytic anemia   . Chronic combined systolic and diastolic congestive heart failure ([place]) 06/03/2018  . Candida esophagitis ([place]) 09/22/2017  . History of smoking 30 or more pack years 01/05/2017  . Bipolar 1 disorder ([place])   . BPH (benign prostatic hyperplasia) 03/31/2013  . 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 Essential hypertension, benign 03/31/2013  . Acute renal failure superimposed on stage 3 chronic kidney disease ([place]) 06/04/2012  . 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[place]) 01/23/2010    Current Outpatient Medications:  .  allopurinol (ZYLOPRIM) 100 MG tablet, Take 2 tablets (200 mg total) by mouth daily., Disp: 60 tablet, Rfl: 5 .  Cholecalciferol (VITAMIN D) 125 MCG (5000 UT) CAPS, Take 5,000 Units by mouth daily. , Disp: , Rfl:  .  divalproex (DEPAKOTE) 500 MG EC tablet, Take 1,500 mg by mouth at bedtime. , Disp: , Rfl:  .  ferrous sulfate 325 (65 FE) MG tablet, Take 325 mg by mouth every Monday, Wednesday, and Friday. , Disp: , Rfl:  .  furosemide (LASIX) 20 MG tablet, Take 1 tablet (20 mg total) by mouth every other day. (Patient taking differently: Take 20 mg by mouth every Monday, Wednesday, and Friday. ), Disp: 45 tablet, Rfl: 3 .  hydrALAZINE (APRESOLINE) 25 MG tablet, Take 1 tablet (25 mg total) by mouth 2 (two) times daily., Disp: 60 tablet, Rfl: 3 .  isosorbide mononitrate (IMDUR) 30 MG 24 hr tablet, Take 1  tablet (30 mg total) by mouth daily., Disp: 90 tablet, Rfl: 3 .  loperamide (IMODIUM) 2 MG capsule, Take 1 capsule (2 mg total) by mouth 4 (four) times daily as needed for diarrhea or loose stools., Disp: 12 capsule, Rfl: 0 .  magnesium oxide (MAG-OX) 400 MG tablet, Take 400 mg by mouth every Monday, Wednesday, and Friday., Disp: , Rfl:  .  metoprolol succinate (TOPROL-XL) 25 MG 24 hr tablet, Take 1 tablet (25 mg total) by mouth at bedtime., Disp: 90 tablet, Rfl: 3 .  OLANZapine (ZYPREXA) 7.5 MG tablet, Take 1 tablet (7.5 mg total) by mouth at bedtime., Disp: 30 tablet, Rfl: 11 .  omeprazole (PRILOSEC) 20 MG capsule, Take 20 mg by mouth daily before breakfast. , Disp: , Rfl:  .  tamsulosin (FLOMAX) 0.4 MG CAPS capsule, Take 0.4 mg by mouth daily., Disp: , Rfl:  .  vitamin B-12 (CYANOCOBALAMIN) 1000 MCG tablet, Take 1,000 mcg by mouth daily., Disp: , Rfl:  .  epoetin alfa (PROCRIT) 30865 UNIT/ML injection, Inject 10,000 Units into the skin every 30 (thirty) days. , Disp: , Rfl:  .  inFLIXimab (REMICADE) 100 MG injection, Infuse Remicade IV schedule 1 13m/kg every 8 weeks Premedicate with Tylenol 500-6528mby mouth and Benadryl 25-5040my mouth prior to infusion. Last PPD was on 12/2009.  (Patient taking differently: Infuse Remicade IV schedule 1 5mg64m every 8 weeks Premedicate with Tylenol 500-650mg39mmouth and Benadryl 25-50mg 55mouth  prior to infusion. Last PPD was on 12/2009.), Disp: 1 each, Rfl: 6 .  ivabradine (CORLANOR) 5 MG TABS tablet, Take 1 tablet (5 mg total) by mouth 2 (two) times daily with a meal., Disp: 180 tablet, Rfl: 3 Allergies  Allergen Reactions  . Azathioprine Other (See Comments)    REACTION: affected WBC "Almost died"  . Ciprofloxacin Other (See Comments)    Unknown rxn  . Levaquin [Levofloxacin In D5w] Other (See Comments)    Unknown rxn  . Plendil [Felodipine] Other (See Comments)    Unknown rxn     Social History   Socioeconomic History  . Marital status:  Divorced    Spouse name: Not on file  . Number of children: 1  . Years of education: Not on file  . Highest education level: Not on file  Occupational History  . Occupation: retired  . Occupation: Veteran  Tobacco Use  . Smoking status: Former Smoker    Packs/day: 1.00    Years: 49.00    Pack years: 49.00    Types: Cigarettes    Start date: 04/11/1956    Quit date: 04/17/2014    Years since quitting: 4.8  . Smokeless tobacco: Never Used  Substance and Sexual Activity  . Alcohol use: No    Alcohol/week: 0.0 standard drinks  . Drug use: No  . Sexual activity: Never  Other Topics Concern  . Not on file  Social History Narrative  . Not on file   Social Determinants of Health   Financial Resource Strain:   . Difficulty of Paying Living Expenses: Not on file  Food Insecurity:   . Worried About Charity fundraiser in the Last Year: Not on file  . Ran Out of Food in the Last Year: Not on file  Transportation Needs:   . Lack of Transportation (Medical): Not on file  . Lack of Transportation (Non-Medical): Not on file  Physical Activity:   . Days of Exercise per Week: Not on file  . Minutes of Exercise per Session: Not on file  Stress:   . Feeling of Stress : Not on file  Social Connections:   . Frequency of Communication with Friends and Family: Not on file  . Frequency of Social Gatherings with Friends and Family: Not on file  . Attends Religious Services: Not on file  . Active Member of Clubs or Organizations: Not on file  . Attends Archivist Meetings: Not on file  . Marital Status: Not on file  Intimate Partner Violence:   . Fear of Current or Ex-Partner: Not on file  . Emotionally Abused: Not on file  . Physically Abused: Not on file  . Sexually Abused: Not on file    Physical Exam Pulmonary:     Effort: No respiratory distress.     Breath sounds: No wheezing or rales.  Abdominal:     General: There is no distension.  Musculoskeletal:     Right  lower leg: No edema.     Left lower leg: No edema.  Skin:    General: Skin is warm and dry.         Future Appointments  Date Time Provider [place]  03/10/2019  8:00 AM [facility] RM 1 [facility] None  03/15/2019  1:30 PM [facility] PHASE II ORIENT [facility] None  03/18/2019 11:30 AM Gayland Curry, DO [facility] None  04/19/2019 10:00 AM Larey Dresser, MD [facility] None  01/20/2020  2:15 PM [name], [name], NP [facility]  None     BP 110/80 (BP Location: Right Arm, Patient Position: Sitting, Cuff Size: Normal)   Pulse 68   Wt 166 lb (75.3 kg)   SpO2 98%   BMI 20.21 kg/m   Weight yesterday-166 Last visit weight-167  ATF pt CAO x4 sitting in the living room watching tv.  He's taken all of his medications for this past week including today meds. He said that he had a overnight weight gain from christmas on the 26th. [place] [facility] called after he checked his weight that morning.  He admits to getting a lot of foods high in sodium.  He lost one lb since last week. He denies sob, chest pain and dizziness. rx bottles verified and pill box refilled.    Medication ordered: none  Bhargav Barbaro, EMT Paramedic [phone] 02/22/2019    ACTION: Home visit completed

## 2019-02-23 ENCOUNTER — Inpatient Hospital Stay (HOSPITAL_COMMUNITY): Admission: RE | Admit: 2019-02-23 | Payer: Medicare Other | Source: Ambulatory Visit

## 2019-02-28 ENCOUNTER — Ambulatory Visit (INDEPENDENT_AMBULATORY_CARE_PROVIDER_SITE_OTHER): Payer: Medicare Other | Admitting: Family

## 2019-02-28 ENCOUNTER — Ambulatory Visit (HOSPITAL_COMMUNITY): Payer: Medicare Other

## 2019-02-28 ENCOUNTER — Encounter: Payer: Self-pay | Admitting: Family

## 2019-02-28 ENCOUNTER — Telehealth: Payer: Self-pay

## 2019-02-28 ENCOUNTER — Other Ambulatory Visit: Payer: Self-pay

## 2019-02-28 VITALS — BP 142/70 | HR 77 | Temp 97.1°F | Ht 76.0 in | Wt 174.2 lb

## 2019-02-28 DIAGNOSIS — R0981 Nasal congestion: Secondary | ICD-10-CM | POA: Diagnosis not present

## 2019-02-28 DIAGNOSIS — H1033 Unspecified acute conjunctivitis, bilateral: Secondary | ICD-10-CM

## 2019-02-28 DIAGNOSIS — I5042 Chronic combined systolic (congestive) and diastolic (congestive) heart failure: Secondary | ICD-10-CM

## 2019-02-28 MED ORDER — GENTAMICIN SULFATE 0.3 % OP SOLN: 1.0000 [drp] | Freq: Three times a day (TID) | OPHTHALMIC | 0 refills | Status: AC

## 2019-02-28 NOTE — Telephone Encounter (Signed)
Patient is scheduled for visit at 3:45pm today and will be seen by Mordecai Rasmussen NP.

## 2019-02-28 NOTE — Telephone Encounter (Signed)
Patient RN Timmothy Sours called from [place] and states that patient gained 10lbs over night, and BP was 162/99 and HR was 88. RN Timmothy Sours had patient take medication and due a recheck of BP . Patient rechecked BP was 148/117 and HR 84. RN states symptoms patient complained of was Shortness of Breath. RN states that patient takes Hydralazine in the morning and Furosemide in the afternoon. RN states that patient has someone keep up with his medications daily. RN states that she's concerned about patient and that he comes into office 03/18/2019. But wants to know if you'd like to see him sooner or if you'd like to change medications.

## 2019-02-28 NOTE — Progress Notes (Signed)
Provider: Orien Mayhall FNP-C  Gayland Curry, DO  Patient Care Team: Gayland Curry, DO as PCP - General (Geriatric Medicine) Martinique, Peter M, MD as PCP - Cardiology (Cardiology) Larey Dresser, MD as PCP - Advanced Heart Failure (Cardiology) Elmarie Shiley, MD (Nephrology) Milus Banister, MD (Gastroenterology)  Extended Emergency Contact Information Primary Emergency Contact: [place] Mobile Phone: [phone] Relation: Sister  Code Status:  Full code  Goals of care: Advanced Directive information Advanced Directives 02/28/2019  Does Patient Have a Medical Advance Directive? Yes  Type of Advance Directive Out of facility DNR (pink MOST or yellow form)  Does patient want to make changes to medical advance directive? No - Patient declined  Copy of [place] in Chart? -  Would patient like information on creating a medical advance directive? -  Pre-existing out of facility DNR order (yellow form or pink MOST form) -     Chief Complaint  Patient presents with  . Acute Visit    Patient c/o rapid weight gain over 10lbs and high blood pressure patient states he has been eating big macs and drinking soft drinks    . Medication Management    patient states he has been taking loperimide daily due to diarrhea and would like to discuss    HPI:  Pt is a 74 y.o. male seen today for an acute visit for evaluation of weight gain and high pressure.He states weighed himself this morning on the [facility] care scale and was told has gained 10 lbs overnight and B/P was high.He was advised to see provider.he states has been having shortness of breath.He states had a mac and cheese sandwich and a coke on Saturday and Sunday 02/26/2018.Marland KitchenHe added extra salt to the mac " It tasted really good".He has had shortness of breath and increased swelling on his legs.He denies any exposure to sick person with COVID-19.He denies any fever,chills,cough or wheezing.  He states has had  to take loperamide daily due to chronic diarrhea.He is unable to make it to the bathroom if he does not take loperamide.He denies any abdominal cramping,pain,nausea or vomiting.He has a significant medical history of crohn's enteritis.   Also complains of eyelashes sticking in the morning with yellow drainage " Have to scrub hard".His friend told him he needs his eyes checked due to redness.he states eye burn but not so much. Has had drainage for a bout a week.      Past Medical History:  Diagnosis Date  . Anemia   . Anxiety   . Benign paroxysmal positional vertigo   . Bipolar I disorder, most recent episode (or current) unspecified   . Celiac disease   . CHF (congestive heart failure) ([place])   . Chronic kidney disease, stage III (moderate)   . Crohn's    Remicade q8 weeks  . Depression   . Essential and other specified forms of tremor    medication-induced Parkinson's, now resolved  . Essential hypertension, benign   . Gout 2018  . Heart failure ([place])   . Hypertrophy of prostate without urinary obstruction and other lower urinary tract symptoms (LUTS)   . Impotence of organic origin   . Insomnia with sleep apnea, unspecified   . Iron deficiency anemia, unspecified   . 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 Neuralgia, neuritis, and radiculitis, unspecified   . Other B-complex deficiencies   . Other extrapyramidal disease and abnormal movement disorder   . Postinflammatory pulmonary fibrosis ([place])   . Tobacco use disorder   .  Vertigo 2018   Past Surgical History:  Procedure Laterality Date  . CATARACT EXTRACTION, BILATERAL Bilateral 09/2018  . CHOLECYSTECTOMY  07-12-2010  . RIGHT HEART CATH N/A 10/12/2018   Procedure: RIGHT HEART CATH;  Surgeon: Jolaine Artist, MD;  Location: [facility];  Service: Cardiovascular;  Laterality: N/A;  . SMALL INTESTINE SURGERY     x 2    Allergies  Allergen Reactions  . Azathioprine Other (See Comments)    REACTION: affected WBC "Almost died"   . Ciprofloxacin Other (See Comments)    Unknown rxn  . Levaquin [Levofloxacin In D5w] Other (See Comments)    Unknown rxn  . Plendil [Felodipine] Other (See Comments)    Unknown rxn    Outpatient Encounter Medications as of 02/28/2019  Medication Sig  . allopurinol (ZYLOPRIM) 100 MG tablet Take 2 tablets (200 mg total) by mouth daily.  . Cholecalciferol (VITAMIN D) 125 MCG (5000 UT) CAPS Take 5,000 Units by mouth daily.   . divalproex (DEPAKOTE) 500 MG EC tablet Take 1,500 mg by mouth at bedtime.   Marland Kitchen epoetin alfa (PROCRIT) 81448 UNIT/ML injection Inject 10,000 Units into the skin every 30 (thirty) days.   . ferrous sulfate 325 (65 FE) MG tablet Take 325 mg by mouth every Monday, Wednesday, and Friday.   . furosemide (LASIX) 20 MG tablet Take 20 mg by mouth every Monday, Wednesday, and Friday.  . hydrALAZINE (APRESOLINE) 25 MG tablet Take 1 tablet (25 mg total) by mouth 2 (two) times daily.  Marland Kitchen inFLIXimab (REMICADE) 100 MG injection Infuse Remicade IV schedule 1 48m/kg every 8 weeks Premedicate with Tylenol 500-6580mby mouth and Benadryl 25-5040my mouth prior to infusion. Last PPD was on 12/2009.   . isosorbide mononitrate (IMDUR) 30 MG 24 hr tablet Take 1 tablet (30 mg total) by mouth daily.  . ivabradine (CORLANOR) 5 MG TABS tablet Take 1 tablet (5 mg total) by mouth 2 (two) times daily with a meal.  . loperamide (IMODIUM) 2 MG capsule Take 1 capsule (2 mg total) by mouth 4 (four) times daily as needed for diarrhea or loose stools.  . magnesium oxide (MAG-OX) 400 MG tablet Take 400 mg by mouth every Monday, Wednesday, and Friday.  . metoprolol succinate (TOPROL-XL) 25 MG 24 hr tablet Take 1 tablet (25 mg total) by mouth at bedtime.  . OMarland KitchenANZapine (ZYPREXA) 7.5 MG tablet Take 1 tablet (7.5 mg total) by mouth at bedtime.  . oMarland Kitcheneprazole (PRILOSEC) 20 MG capsule Take 20 mg by mouth daily before breakfast.   . tamsulosin (FLOMAX) 0.4 MG CAPS capsule Take 0.4 mg by mouth daily.  . vitamin B-12  (CYANOCOBALAMIN) 1000 MCG tablet Take 1,000 mcg by mouth daily.  . [DISCONTINUED] furosemide (LASIX) 20 MG tablet Take 1 tablet (20 mg total) by mouth every other day. (Patient taking differently: Take 20 mg by mouth every Monday, Wednesday, and Friday. )   No facility-administered encounter medications on file as of 02/28/2019.    Review of Systems  Constitutional: Negative for appetite change, chills, fatigue and fever.  HENT: Positive for congestion. Negative for ear discharge, postnasal drip, rhinorrhea, sinus pressure, sinus pain, sneezing and sore throat.   Eyes: Positive for discharge, redness and itching. Negative for pain and visual disturbance.  Respiratory: Negative for cough, chest tightness and wheezing.        Shortness of breath on exertion   Cardiovascular: Positive for leg swelling. Negative for chest pain and palpitations.  Gastrointestinal: Negative for abdominal distention, abdominal  pain, constipation, diarrhea, nausea and vomiting.  Genitourinary: Negative for difficulty urinating, dysuria, flank pain, frequency and urgency.  Skin: Negative for color change, pallor and rash.  Neurological: Negative for dizziness, weakness, light-headedness and headaches.  Psychiatric/Behavioral: Negative for agitation, confusion and sleep disturbance. The patient is not nervous/anxious.     Immunization History  Administered Date(s) Administered  . Fluad Quad(high Dose 65+) 11/15/2018  . Influenza, High Dose Seasonal PF 11/26/2016  . Influenza,inj,Quad PF,6+ Mos 11/13/2014, 01/03/2016  . Influenza-Unspecified 10/25/2012, 11/24/2016, 11/24/2017  . PPD Test 12/30/2010, 01/05/2012, 01/07/2013, 01/13/2014  . Pneumococcal Conjugate-13 08/10/2014  . Pneumococcal Polysaccharide-23 03/05/2004, 01/03/2016, 05/06/2018  . Tdap 05/26/2011   Pertinent  Health Maintenance Due  Topic Date Due  . COLONOSCOPY  09/17/2027  . INFLUENZA VACCINE  Completed  . PNA vac Low Risk Adult  Completed    Fall Risk  02/28/2019 01/19/2019 01/18/2019 01/04/2019 12/13/2018  Falls in the past year? 0 0 0 0 0  Number falls in past yr: 0 0 0 - -  Injury with Fall? 0 0 0 - -    Vitals:   02/28/19 1557  BP: (!) 142/70  Pulse: 77  Temp: (!) 97.1 F (36.2 C)  TempSrc: Temporal  SpO2: 96%  Weight: 174 lb 3.2 oz (79 kg)  Height: _0  (1.93 m)   Body mass index is 21.2 kg/m. Physical Exam Vitals reviewed.  Constitutional:      General: He is not in acute distress.    Appearance: He is not ill-appearing.  HENT:     Head: Normocephalic.     Nose: Congestion present. No rhinorrhea.     Comments: Dry hard secretion noted.    Mouth/Throat:     Mouth: Mucous membranes are moist.     Pharynx: Oropharynx is clear. No oropharyngeal exudate or posterior oropharyngeal erythema.  Eyes:     General: No scleral icterus.       Right eye: Discharge present.        Left eye: Discharge present.    Extraocular Movements: Extraocular movements intact.     Pupils: Pupils are equal, round, and reactive to light.     Comments: Bilateral conjuctivae redness noted   Neck:     Vascular: No carotid bruit.  Cardiovascular:     Rate and Rhythm: Normal rate and regular rhythm.     Pulses: Normal pulses.     Heart sounds: Normal heart sounds. No murmur. No friction rub. No gallop.   Pulmonary:     Effort: Pulmonary effort is normal. No respiratory distress.     Breath sounds: Normal breath sounds. No wheezing, rhonchi or rales.     Comments: Slight Shortness of breath with exertion  Chest:     Chest wall: No tenderness.  Abdominal:     General: Bowel sounds are normal. There is no distension.     Palpations: Abdomen is soft. There is no mass.     Tenderness: There is no abdominal tenderness. There is no right CVA tenderness, left CVA tenderness, guarding or rebound.  Musculoskeletal:        General: No swelling or tenderness. Normal range of motion.     Cervical back: Normal range of motion. No  rigidity or tenderness.     Right lower leg: Edema present.     Left lower leg: Edema present.  Lymphadenopathy:     Cervical: No cervical adenopathy.  Skin:    General: Skin is warm and dry.     Coloration:  Skin is not pale.     Findings: No bruising, erythema or rash.  Neurological:     Mental Status: He is alert and oriented to person, place, and time.     Motor: No weakness.     Coordination: Coordination normal.     Gait: Gait normal.  Psychiatric:        Mood and Affect: Mood normal.        Behavior: Behavior normal.        Thought Content: Thought content normal.        Judgment: Judgment normal.    Labs reviewed: Recent Labs    05/08/18 0256 05/09/18 0251 05/10/18 0330 05/12/18 0629 09/01/18 9758 09/02/18 0514 09/08/18 0248 09/09/18 0443 01/17/19 0856 01/18/19 1231 01/28/19 0954  NA 141 143 142   < > 138 139 139   < > 144 143 144  K 3.9 3.9 4.1  --  4.5 4.0 3.9  --  3.4* 3.3* 4.1  CL 108 112* 112*  --  111 110 113*  --  111 111 111  CO2 _0 --  15* 15* 16*  --  24 21* 25  GLUCOSE 99 95 86  --  100* 84 95  --  94 101* 100*  BUN 28* 26* 20   < > 51* 67* 57*   < > 20 21 26*  CREATININE 2.13* 1.86* 1.82*   < > 2.42* 2.46* 2.44*   < > 2.23* 2.29* 1.76*  CALCIUM 8.5* 8.1* 8.4*  --  7.3* 7.6* 9.0  --  8.6* 8.5* 8.8*  MG 1.9 1.8 1.8  --  1.0* 1.0* 1.6*  --   --   --   --   PHOS 3.0 3.1 3.0  --   --   --   --   --   --   --   --    < > = values in this interval not displayed.   Recent Labs    09/02/18 0514 09/08/18 0248 09/09/18 0443  AST 21 13* 12*  ALT _1 ALKPHOS 70 61 50  BILITOT 0.2* 0.5 0.4  PROT 7.6 6.7 6.3*  ALBUMIN 3.6 3.0* 2.7*   Recent Labs    09/02/18 0514 09/07/18 1028 09/08/18 0248 10/12/18 1230 12/30/18 0000 01/07/19 0955 01/18/19 1231 02/07/19 0932  WBC 11.0*   < > 9.0 10.6* 9.0  --  7.8  --   NEUTROABS 6.6  --  5.4  --  6  --   --   --   HGB 10.9*  --  10.7* 9.5* 10.5* 10.4* 10.8* 10.6*  HCT 35.3*  --  33.6*  32.0*  31.3* 31* 34.6* 34.6* 35.3*  MCV 106.3*   < > 104.7* 114.2*  --   --  107.8*  --   PLT 247   < > 201 202 169  --  176  --    < > = values in this interval not displayed.   Lab Results  Component Value Date   TSH 0.744 09/08/2018   Lab Results  Component Value Date   HGBA1C 4.9 08/10/2015   Lab Results  Component Value Date   CHOL 138 05/04/2017   HDL 42 05/04/2017   LDLCALC 71 05/04/2017   TRIG 176 (H) 05/04/2017   CHOLHDL 3.3 05/04/2017    Significant Diagnostic Results in last 30 days:  No results found.  Assessment/Plan 1. Acute bacterial conjunctivitis of both eyes Bilateral eyelashes with yellow  drainage with conjunctivae redness noted. cleanse both eye with warm water wash cloth and baby shampoo prior to applying eye drops. Notify provider if redness or drainage not resolved or changes in vision.  - gentamicin (GARAMYCIN) 0.3 % ophthalmic solution; Place 1 drop into both eyes 3 (three) times daily for 7 days.  Dispense: 5 mL; Refill: 0   2. Chronic combined systolic and diastolic congestive heart failure (HCC) - reports 10 lbs weight gain overnight on Home weighing scale per heart clinic scale.bilateral lower extremities edema with slight shortness of breath with exertion. - Advised to Take extra Furosemide 20 mg tablet today 02/28/2019 and tomorrow 03/01/2019 then resume Furosemide 20 mg tablet on Monday,Wednesday and Friday  - Notify provider for worsening edema,cough,shortness of breath,wheezing or abrupt weight gain  - Avoid adding extra salt in food or drinking sodas.  3. Nasal congestion Afebrile.No facial tenderness to palpation but has very dry unclear if dried up mucous.patient report pain trying to removed dry stuff from the nose.could consider saline but due to patient congestion and pain trying to remove dry stuff from the nose prefer to refer to ENT.He has seen specialist in the past request ENT on [address] ,[place].  - Ambulatory referral to ENT  Family/  staff Communication: Reviewed plan of care with patient   Labs/tests ordered: None   Sandrea Hughs, NP

## 2019-02-28 NOTE — Patient Instructions (Addendum)
-   Take extra Furosemide 20 mg tablet today 02/28/2019 and tomorrow 03/01/2019 then resume Furosemide 20 mg tablet on Monday,Wednesday and Friday  - Notify provider for worsening edema,cough,shortness of breath,wheezing or abrupt weight gain  - Avoid adding extra salt in food or drinking sodas.  - Apply Garamycin eye drops one drop into both eyes three times a day for 7 days.cleanse both eye with warm water wash cloth and baby shampoo prior to applying eye drops. Notify provider if redness or drainage not resolved or changes in vision.

## 2019-02-28 NOTE — Telephone Encounter (Signed)
Yes, let's have him come in sooner.  He actually missed his last appointment with me before Christmas.  Appt ideally with me, but can also be one NP if he can then be seen sooner.

## 2019-03-02 ENCOUNTER — Other Ambulatory Visit (HOSPITAL_COMMUNITY): Payer: Self-pay

## 2019-03-02 ENCOUNTER — Ambulatory Visit (HOSPITAL_COMMUNITY): Payer: Medicare Other

## 2019-03-02 NOTE — Progress Notes (Signed)
Paramedicine Encounter    Patient ID: Brent [name], male    DOB: 02/14/1946, 74 y.o.   MRN: [id number]   Patient Care Team: Gayland Curry, DO as PCP - General (Geriatric Medicine) [place], Peter M, MD as PCP - Cardiology (Cardiology) Larey Dresser, MD as PCP - Advanced Heart Failure (Cardiology) Elmarie Shiley, MD (Nephrology) Milus Banister, MD (Gastroenterology)  Patient Active Problem List   Diagnosis Date Noted  . Metabolic acidosis 78/24/2353  . Orthostasis 09/07/2018  . AKI (acute kidney injury) ([place])   . Immunosuppressed status ([place])   . Macrocytic anemia   . Chronic combined systolic and diastolic congestive heart failure ([place]) 06/03/2018  . Candida esophagitis ([place]) 09/22/2017  . History of smoking 30 or more pack years 01/05/2017  . Bipolar 1 disorder ([place])   . BPH (benign prostatic hyperplasia) 03/31/2013  . 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 Essential hypertension, benign 03/31/2013  . Acute renal failure superimposed on stage 3 chronic kidney disease ([place]) 06/04/2012  . 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  allopurinol (ZYLOPRIM) 100 MG tablet, Take 2 tablets (200 mg total) by mouth daily., Disp: 60 tablet, Rfl: 5 .  Cholecalciferol (VITAMIN D) 125 MCG (5000 UT) CAPS, Take 5,000 Units by mouth daily. , Disp: , Rfl:  .  divalproex (DEPAKOTE) 500 MG EC tablet, Take 1,500 mg by mouth at bedtime. , Disp: , Rfl:  .  epoetin alfa (PROCRIT) 61443 UNIT/ML injection, Inject 10,000 Units into the skin every 30 (thirty) days. , Disp: , Rfl:  .  ferrous sulfate 325 (65 FE) MG tablet, Take 325 mg by mouth every Monday, Wednesday, and Friday. , Disp: , Rfl:  .  furosemide (LASIX) 20 MG tablet, Take 20 mg by mouth every Monday, Wednesday, and Friday., Disp: , Rfl:  .  gentamicin (GARAMYCIN) 0.3 % ophthalmic solution, Place 1 drop into both eyes 3 (three) times daily for 7 days., Disp: 5 mL, Rfl: 0 .  hydrALAZINE  (APRESOLINE) 25 MG tablet, Take 1 tablet (25 mg total) by mouth 2 (two) times daily., Disp: 60 tablet, Rfl: 3 .  inFLIXimab (REMICADE) 100 MG injection, Infuse Remicade IV schedule 1 53m/kg every 8 weeks Premedicate with Tylenol 500-6543mby mouth and Benadryl 25-5085my mouth prior to infusion. Last PPD was on 12/2009. , Disp: 1 each, Rfl: 6 .  isosorbide mononitrate (IMDUR) 30 MG 24 hr tablet, Take 1 tablet (30 mg total) by mouth daily., Disp: 90 tablet, Rfl: 3 .  ivabradine (CORLANOR) 5 MG TABS tablet, Take 1 tablet (5 mg total) by mouth 2 (two) times daily with a meal., Disp: 180 tablet, Rfl: 3 .  loperamide (IMODIUM) 2 MG capsule, Take 1 capsule (2 mg total) by mouth 4 (four) times daily as needed for diarrhea or loose stools., Disp: 12 capsule, Rfl: 0 .  magnesium oxide (MAG-OX) 400 MG tablet, Take 400 mg by mouth every Monday, Wednesday, and Friday., Disp: , Rfl:  .  metoprolol succinate (TOPROL-XL) 25 MG 24 hr tablet, Take 1 tablet (25 mg total) by mouth at bedtime., Disp: 90 tablet, Rfl: 3 .  OLANZapine (ZYPREXA) 7.5 MG tablet, Take 1 tablet (7.5 mg total) by mouth at bedtime., Disp: 30 tablet, Rfl: 11 .  omeprazole (PRILOSEC) 20 MG capsule, Take 20 mg by mouth daily before breakfast. , Disp: , Rfl:  .  tamsulosin (FLOMAX) 0.4 MG CAPS capsule, Take 0.4 mg by mouth daily., Disp: , Rfl:  .  vitamin B-12 (CYANOCOBALAMIN) 1000 MCG tablet, Take 1,000 mcg by mouth daily., Disp: , Rfl:  Allergies  Allergen Reactions  . Azathioprine Other (See Comments)    REACTION: affected WBC "Almost died"  . Ciprofloxacin Other (See Comments)    Unknown rxn  . Levaquin [Levofloxacin In D5w] Other (See Comments)    Unknown rxn  . Plendil [Felodipine] Other (See Comments)    Unknown rxn      Social History   Socioeconomic History  . Marital status: Divorced    Spouse name: Not on file  . Number of children: 1  . Years of education: Not on file  . Highest education level: Not on file  Occupational  History  . Occupation: retired  . Occupation: Veteran  Tobacco Use  . Smoking status: Former Smoker    Packs/day: 1.00    Years: 49.00    Pack years: 49.00    Types: Cigarettes    Start date: 04/11/1956    Quit date: 04/17/2014    Years since quitting: 4.8  . Smokeless tobacco: Never Used  Substance and Sexual Activity  . Alcohol use: No    Alcohol/week: 0.0 standard drinks  . Drug use: No  . Sexual activity: Never  Other Topics Concern  . Not on file  Social History Narrative  . Not on file   Social Determinants of Health   Financial Resource Strain:   . Difficulty of Paying Living Expenses: Not on file  Food Insecurity:   . Worried About Charity fundraiser in the Last Year: Not on file  . Ran Out of Food in the Last Year: Not on file  Transportation Needs:   . Lack of Transportation (Medical): Not on file  . Lack of Transportation (Non-Medical): Not on file  Physical Activity:   . Days of Exercise per Week: Not on file  . Minutes of Exercise per Session: Not on file  Stress:   . Feeling of Stress : Not on file  Social Connections:   . Frequency of Communication with Friends and Family: Not on file  . Frequency of Social Gatherings with Friends and Family: Not on file  . Attends Religious Services: Not on file  . Active Member of Clubs or Organizations: Not on file  . Attends Archivist Meetings: Not on file  . Marital Status: Not on file  Intimate Partner Violence:   . Fear of Current or Ex-Partner: Not on file  . Emotionally Abused: Not on file  . Physically Abused: Not on file  . Sexually Abused: Not on file    Physical Exam Cardiovascular:     Rate and Rhythm: Normal rate and regular rhythm.     Pulses: Normal pulses.  Pulmonary:     Effort: Pulmonary effort is normal.     Breath sounds: Normal breath sounds.  Musculoskeletal:        General: Normal range of motion.     Right lower leg: Edema present.     Left lower leg: Edema present.   Skin:    General: Skin is warm and dry.     Capillary Refill: Capillary refill takes less than 2 seconds.  Neurological:     Mental Status: He is alert and oriented to person, place, and time.  Psychiatric:        Mood and Affect: Mood normal.         Future Appointments  Date Time Provider [place]  03/04/2019  2:30 PM Rozetta Nunnery,  MD ENT[facility] None  03/10/2019  8:00 AM [facility] RM 1 [facility] None  03/15/2019  1:30 PM [facility] PHASE II ORIENT [facility] None  03/18/2019 11:30 AM Mariea Clonts, Tiffany L, DO [facility] None  04/19/2019 10:00 AM Larey Dresser, MD [facility] None  01/20/2020  2:15 PM [name], [name], NP [facility] None    BP (!) 141/75 (BP Location: Right Arm, Patient Position: Sitting, Cuff Size: Normal)   Pulse 65   Resp 18   Wt 173 lb 6.4 oz (78.7 kg)   SpO2 98%   BMI 21.11 kg/m   Weight yesterday- cant remember Last visit weight- 174 lb  Mr Brent [name] was seen at home today and reported feeling well. He denied chest pain, headache, dizziness, orthopnea, fever or cough but is having exertional dyspnea. He stated he has been compliant with his medications over the past week but his diet has been heavily reliant on fast food. He had already filled his pillbox so I checked it for accuracy and noted a couple of small oversights which I corrected. Nothing further was needed at this time. I will follow up next week if Karena Addison is not back.  Jacquiline Doe, EMT 03/02/19  ACTION: Home visit completed Next visit planned for 1 week

## 2019-03-03 ENCOUNTER — Telehealth (HOSPITAL_COMMUNITY): Payer: Self-pay | Admitting: Pharmacist

## 2019-03-04 ENCOUNTER — Encounter (INDEPENDENT_AMBULATORY_CARE_PROVIDER_SITE_OTHER): Payer: Self-pay | Admitting: Otolaryngology

## 2019-03-04 ENCOUNTER — Other Ambulatory Visit: Payer: Self-pay

## 2019-03-04 ENCOUNTER — Ambulatory Visit (HOSPITAL_COMMUNITY): Payer: Medicare Other

## 2019-03-04 ENCOUNTER — Ambulatory Visit (INDEPENDENT_AMBULATORY_CARE_PROVIDER_SITE_OTHER): Payer: Medicare Other | Admitting: Otolaryngology

## 2019-03-04 VITALS — Temp 98.1°F

## 2019-03-04 DIAGNOSIS — J310 Chronic rhinitis: Secondary | ICD-10-CM | POA: Diagnosis not present

## 2019-03-04 NOTE — Telephone Encounter (Signed)
Cardiac Rehab Medication Review by a Pharmacist  Does the patient  feel that his/her medications are working for him/her?  Yes they are working well. Patient believes   Has the patient been experiencing any side effects to the medications prescribed?  Yes a little bit of drowsiness but nothing significant.   Does the patient measure his/her own blood pressure or blood glucose at home?  yes Patient weighs himself everyday and has BP monitor that is virtually monitored by [facility]. Most recent BP from doctor's office was 142/70  Does the patient have any problems obtaining medications due to transportation or finances?   No issues with obtaining medications  Understanding of regimen: good Understanding of indications: good Potential of compliance: good, Patient is pretty compliant with the medication and misses only about 2 doses a month.   Please verify patient's Remicade and Procrit dose.  Sherren Kerns, PharmD PGY1 Acute Care Pharmacy Resident 03/04/2019 3:59 PM

## 2019-03-04 NOTE — Progress Notes (Signed)
HPI: Brent [name] is a 74 y.o. male who presents is referred by PCP for evaluation of chronic nasal obstruction he has had for several months.  He has history of congestive heart failure and is on a low-salt diet.  He has had difficulty breathing through his nose because of a lot crusting in mucus obstructing his nasal cavity..  Past Medical History:  Diagnosis Date  . Anemia   . Anxiety   . Benign paroxysmal positional vertigo   . Bipolar I disorder, most recent episode (or current) unspecified   . Celiac disease   . CHF (congestive heart failure) ([place])   . Chronic kidney disease, stage III (moderate)   . Crohn's    Remicade q8 weeks  . Depression   . Essential and other specified forms of tremor    medication-induced Parkinson's, now resolved  . Essential hypertension, benign   . Gout 2018  . Heart failure ([place])   . Hypertrophy of prostate without urinary obstruction and other lower urinary tract symptoms (LUTS)   . Impotence of organic origin   . Insomnia with sleep apnea, unspecified   . Iron deficiency anemia, unspecified   . 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 Neuralgia, neuritis, and radiculitis, unspecified   . Other B-complex deficiencies   . Other extrapyramidal disease and abnormal movement disorder   . Postinflammatory pulmonary fibrosis ([place])   . Tobacco use disorder   . Vertigo 2018   Past Surgical History:  Procedure Laterality Date  . CATARACT EXTRACTION, BILATERAL Bilateral 09/2018  . CHOLECYSTECTOMY  07-12-2010  . RIGHT HEART CATH N/A 10/12/2018   Procedure: RIGHT HEART CATH;  Surgeon: Jolaine Artist, MD;  Location: [facility];  Service: Cardiovascular;  Laterality: N/A;  . SMALL INTESTINE SURGERY     x 2   Social History   Socioeconomic History  . Marital status: Divorced    Spouse name: Not on file  . Number of children: 1  . Years of education: Not on file  . Highest education level: Not on file  Occupational History  . Occupation: retired  .  Occupation: Veteran  Tobacco Use  . Smoking status: Former Smoker    Packs/day: 1.00    Years: 49.00    Pack years: 49.00    Types: Cigarettes    Start date: 04/11/1956    Quit date: 04/17/2014    Years since quitting: 4.8  . Smokeless tobacco: Never Used  Substance and Sexual Activity  . Alcohol use: No    Alcohol/week: 0.0 standard drinks  . Drug use: No  . Sexual activity: Never  Other Topics Concern  . Not on file  Social History Narrative  . Not on file   Social Determinants of Health   Financial Resource Strain:   . Difficulty of Paying Living Expenses: Not on file  Food Insecurity:   . Worried About Charity fundraiser in the Last Year: Not on file  . Ran Out of Food in the Last Year: Not on file  Transportation Needs:   . Lack of Transportation (Medical): Not on file  . Lack of Transportation (Non-Medical): Not on file  Physical Activity:   . Days of Exercise per Week: Not on file  . Minutes of Exercise per Session: Not on file  Stress:   . Feeling of Stress : Not on file  Social Connections:   . Frequency of Communication with Friends and Family: Not on file  . Frequency of Social Gatherings with Friends and  Family: Not on file  . Attends Religious Services: Not on file  . Active Member of Clubs or Organizations: Not on file  . Attends Archivist Meetings: Not on file  . Marital Status: Not on file   Family History  Problem Relation Age of Onset  . Diabetes Mother        maternal grandmother  . Uterine cancer Mother   . Emphysema Father   . Pneumonia Maternal Grandmother   . Colon cancer Neg Hx    Allergies  Allergen Reactions  . Azathioprine Other (See Comments)    REACTION: affected WBC "Almost died"  . Ciprofloxacin Other (See Comments)    Unknown rxn  . Levaquin [Levofloxacin In D5w] Other (See Comments)    Unknown rxn  . Plendil [Felodipine] Other (See Comments)    Unknown rxn   Prior to Admission medications   Medication Sig  Start Date End Date Taking? Authorizing Provider  allopurinol (ZYLOPRIM) 100 MG tablet Take 2 tablets (200 mg total) by mouth daily. 01/05/19  Yes Reed, Tiffany L, DO  Cholecalciferol (VITAMIN D) 125 MCG (5000 UT) CAPS Take 5,000 Units by mouth daily.    Yes [provider]  divalproex (DEPAKOTE) 500 MG EC tablet Take 1,500 mg by mouth at bedtime.    Yes [provider]  epoetin alfa (PROCRIT) 17510 UNIT/ML injection Inject 10,000 Units into the skin every 30 (thirty) days.    Yes [provider]  ferrous sulfate 325 (65 FE) MG tablet Take 325 mg by mouth every Monday, Wednesday, and Friday.    Yes [provider]  furosemide (LASIX) 20 MG tablet Take 20 mg by mouth every Monday, Wednesday, and Friday.   Yes [provider]  gentamicin (GARAMYCIN) 0.3 % ophthalmic solution Place 1 drop into both eyes 3 (three) times daily for 7 days. 02/28/19 03/07/19 Yes [name], Dinah C, NP  hydrALAZINE (APRESOLINE) 25 MG tablet Take 1 tablet (25 mg total) by mouth 2 (two) times daily. 01/17/19  Yes Simmons, Brittainy M, PA-C  inFLIXimab (REMICADE) 100 MG injection Infuse Remicade IV schedule 1 35m/kg every 8 weeks Premedicate with Tylenol 500-6580mby mouth and Benadryl 25-5044my mouth prior to infusion. Last PPD was on 12/2009.  11/22/10  Yes JacMilus BanisterD  isosorbide mononitrate (IMDUR) 30 MG 24 hr tablet Take 1 tablet (30 mg total) by mouth daily. 12/28/18  Yes Bensimhon, DanShaune PascalD  ivabradine (CORLANOR) 5 MG TABS tablet Take 1 tablet (5 mg total) by mouth 2 (two) times daily with a meal. 01/17/19  Yes SimLyda Jester PA-C  loperamide (IMODIUM) 2 MG capsule Take 1 capsule (2 mg total) by mouth 4 (four) times daily as needed for diarrhea or loose stools. 01/18/19  Yes Fawze, Mina A, PA-C  magnesium oxide (MAG-OX) 400 MG tablet Take 400 mg by mouth every Monday, Wednesday, and Friday.   Yes [provider]  metoprolol succinate (TOPROL-XL) 25 MG  24 hr tablet Take 1 tablet (25 mg total) by mouth at bedtime. 12/28/18  Yes Bensimhon, DanShaune PascalD  OLANZapine (ZYPREXA) 7.5 MG tablet Take 1 tablet (7.5 mg total) by mouth at bedtime. 01/03/16  Yes Reed, Tiffany L, DO  omeprazole (PRILOSEC) 20 MG capsule Take 20 mg by mouth daily before breakfast.    Yes [provider]  tamsulosin (FLOMAX) 0.4 MG CAPS capsule Take 0.4 mg by mouth daily.   Yes [provider]  vitamin B-12 (CYANOCOBALAMIN) 1000 MCG tablet Take 1,000  mcg by mouth daily.   Yes [provider]     Positive ROS: Otherwise negative  All other systems have been reviewed and were otherwise negative with the exception of those mentioned in the HPI and as above.  Physical Exam: Constitutional: Alert, well-appearing, no acute distress Ears: External ears without lesions or tenderness. Ear canals are clear bilaterally with intact, clear TMs.  Nasal: External nose without lesions.  The right nasal cavity is completely occluded with scabbing and crusting stuck within the nostril hairs.  The left nasal cavity had minimal crusting but a lot of nasal hairs.  This crusting and scabbing was cleaned in the office using forceps as well as scissors to cut several of the nasal hairs.  After cleaning the anterior nasal cavity the more posterior nasal cavity was relatively clear has mild rhinitis with mild mucosal swelling and clear mucus discharge.  No polyps noted.  Mild septal deformity. Oral: Lips and gums without lesions. Tongue and palate mucosa without lesions. Posterior oropharynx clear. Neck: No palpable adenopathy or masses Respiratory: Breathing comfortably  Skin: No facial/neck lesions or rash noted.  Procedures  Assessment: Chronic rhinitis. Excessive anterior nasal crusting.  Plan: After cleaning the nose he was breathing much better. Discussed with him concerning cleaning the nose daily when he is in his shower with soap and water. I also prescribed  use of Nasacort 2 sprays each nostril at night when he goes to bed as this will help keep the nasal passages a little less edematous.   Radene Journey, MD   CC:

## 2019-03-06 ENCOUNTER — Telehealth: Payer: Self-pay | Admitting: Physician Assistant

## 2019-03-06 NOTE — Telephone Encounter (Signed)
Patient called with swelling in his legs, but he spilled his pill box. Someone (EMT?) makes his pill box for him. I attempted to guide him to at least take his lasix this morning. He stated he found his AM pills that didn't spill. He will call back if he can't figure out his medications or has trouble breathing.

## 2019-03-07 ENCOUNTER — Ambulatory Visit (HOSPITAL_COMMUNITY): Payer: Medicare Other

## 2019-03-07 ENCOUNTER — Other Ambulatory Visit (HOSPITAL_COMMUNITY): Payer: Self-pay

## 2019-03-07 ENCOUNTER — Telehealth (HOSPITAL_COMMUNITY): Payer: Self-pay

## 2019-03-07 NOTE — Progress Notes (Signed)
Came out upon patient request after him spilling his pill box to come out and redo his pill box. Pill box filled accordingly. Karena Addison will follow up upon her return to work.

## 2019-03-07 NOTE — Telephone Encounter (Signed)
Received email in regards to [name] needing assistance for pill box refill after it had fallen and scattered pills. Left message for [name] to return my call to schedule time for same.

## 2019-03-09 ENCOUNTER — Ambulatory Visit (HOSPITAL_COMMUNITY): Payer: Medicare Other

## 2019-03-10 ENCOUNTER — Ambulatory Visit (HOSPITAL_COMMUNITY)
Admission: RE | Admit: 2019-03-10 | Discharge: 2019-03-10 | Disposition: A | Discharge status: Home / Self Care | Payer: Medicare Other | Source: Ambulatory Visit | Attending: Nephrology | Admitting: Nephrology

## 2019-03-10 ENCOUNTER — Other Ambulatory Visit (HOSPITAL_COMMUNITY): Payer: Self-pay

## 2019-03-10 ENCOUNTER — Other Ambulatory Visit: Payer: Self-pay

## 2019-03-10 ENCOUNTER — Telehealth (HOSPITAL_COMMUNITY): Payer: Self-pay | Admitting: Pharmacist

## 2019-03-10 DIAGNOSIS — N183 Chronic kidney disease, stage 3 unspecified: Secondary | ICD-10-CM | POA: Diagnosis not present

## 2019-03-10 DIAGNOSIS — D631 Anemia in chronic kidney disease: Secondary | ICD-10-CM | POA: Diagnosis not present

## 2019-03-10 LAB — IRON AND TIBC
Iron: 51 ug/dL (ref 45–182)
Saturation Ratios: 23 % (ref 17.9–39.5)
TIBC: 224 ug/dL — ABNORMAL LOW (ref 250–450)
UIBC: 173 ug/dL

## 2019-03-10 LAB — HEMOGLOBIN AND HEMATOCRIT, BLOOD
HCT: 35.2 % — ABNORMAL LOW (ref 39.0–52.0)
Hemoglobin: 10.5 g/dL — ABNORMAL LOW (ref 13.0–17.0)

## 2019-03-10 LAB — FERRITIN: Ferritin: 694 ng/mL — ABNORMAL HIGH (ref 24–336)

## 2019-03-10 MED ORDER — IVABRADINE HCL 5 MG PO TABS: 5.0000 mg | ORAL_TABLET | Freq: Two times a day (BID) | ORAL | 3 refills | Status: DC

## 2019-03-10 MED ORDER — EPOETIN ALFA 10000 UNIT/ML IJ SOLN
10000.0000 [IU] | INTRAMUSCULAR | Status: DC
  Administered 2019-03-10: 09:00:00 10000 [IU] via SUBCUTANEOUS
  Filled 2019-03-10: qty 1

## 2019-03-10 NOTE — Discharge Instructions (Signed)

## 2019-03-10 NOTE — Progress Notes (Signed)
PATIENT CARE CENTER NOTE  Diagnosis:Anemia associated with Chronic Renal Failure, anemia associated with renal disease   Provider:Patel, Ulice Dash MD   Procedure:Epoetin Alfa (Procrit) injection   Note:Patient received sub-qProcritinjection inleftarm. Tolerated well. Labs drawn pre-injection and Hemoglobin was10.5. Patient BP was166/69 (MD aware of elevated BP and is following patient).Discharge instructions given. Patient alert, oriented and ambulatoryat discharge.

## 2019-03-10 NOTE — Progress Notes (Signed)
Paramedicine Encounter    Patient ID: Brent [name], male    DOB: 15-Oct-1945, 74 y.o.   MRN: [id number]    Patient Care Team: Gayland Curry, DO as PCP - General (Geriatric Medicine) Martinique, Peter M, MD as PCP - Cardiology (Cardiology) Larey Dresser, MD as PCP - Advanced Heart Failure (Cardiology) Elmarie Shiley, MD (Nephrology) Milus Banister, MD (Gastroenterology)  Patient Active Problem List   Diagnosis Date Noted  . Metabolic acidosis 67/89/3810  . Orthostasis 09/07/2018  . AKI (acute kidney injury) ([place])   . Immunosuppressed status ([place])   . Macrocytic anemia   . Chronic combined systolic and diastolic congestive heart failure ([place]) 06/03/2018  . Candida esophagitis ([place]) 09/22/2017  . History of smoking 30 or more pack years 01/05/2017  . Bipolar 1 disorder ([place])   . BPH (benign prostatic hyperplasia) 03/31/2013  . 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 Essential hypertension, benign 03/31/2013  . Acute renal failure superimposed on stage 3 chronic kidney disease ([place]) 06/04/2012  . 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[place]) 01/23/2010    Current Outpatient Medications:  .  allopurinol (ZYLOPRIM) 100 MG tablet, Take 2 tablets (200 mg total) by mouth daily., Disp: 60 tablet, Rfl: 5 .  Cholecalciferol (VITAMIN D) 125 MCG (5000 UT) CAPS, Take 5,000 Units by mouth daily. , Disp: , Rfl:  .  divalproex (DEPAKOTE) 500 MG EC tablet, Take 1,500 mg by mouth at bedtime. , Disp: , Rfl:  .  ferrous sulfate 325 (65 FE) MG tablet, Take 325 mg by mouth every Monday, Wednesday, and Friday. , Disp: , Rfl:  .  furosemide (LASIX) 20 MG tablet, Take 20 mg by mouth every Monday, Wednesday, and Friday., Disp: , Rfl:  .  hydrALAZINE (APRESOLINE) 25 MG tablet, Take 1 tablet (25 mg total) by mouth 2 (two) times daily., Disp: 60 tablet, Rfl: 3 .  isosorbide mononitrate (IMDUR) 30 MG 24 hr tablet, Take 1 tablet (30 mg total) by mouth daily., Disp: 90 tablet, Rfl: 3 .  loperamide (IMODIUM) 2 MG  capsule, Take 1 capsule (2 mg total) by mouth 4 (four) times daily as needed for diarrhea or loose stools., Disp: 12 capsule, Rfl: 0 .  magnesium oxide (MAG-OX) 400 MG tablet, Take 400 mg by mouth every Monday, Wednesday, and Friday., Disp: , Rfl:  .  metoprolol succinate (TOPROL-XL) 25 MG 24 hr tablet, Take 1 tablet (25 mg total) by mouth at bedtime., Disp: 90 tablet, Rfl: 3 .  OLANZapine (ZYPREXA) 7.5 MG tablet, Take 1 tablet (7.5 mg total) by mouth at bedtime., Disp: 30 tablet, Rfl: 11 .  omeprazole (PRILOSEC) 20 MG capsule, Take 20 mg by mouth daily before breakfast. , Disp: , Rfl:  .  tamsulosin (FLOMAX) 0.4 MG CAPS capsule, Take 0.4 mg by mouth daily., Disp: , Rfl:  .  vitamin B-12 (CYANOCOBALAMIN) 1000 MCG tablet, Take 1,000 mcg by mouth daily., Disp: , Rfl:  .  epoetin alfa (PROCRIT) 17510 UNIT/ML injection, Inject 10,000 Units into the skin every 30 (thirty) days. , Disp: , Rfl:  .  inFLIXimab (REMICADE) 100 MG injection, Infuse Remicade IV schedule 1 101m/kg every 8 weeks Premedicate with Tylenol 500-6546mby mouth and Benadryl 25-5060my mouth prior to infusion. Last PPD was on 12/2009. , Disp: 1 each, Rfl: 6 .  ivabradine (CORLANOR) 5 MG TABS tablet, Take 1 tablet (5 mg total) by mouth 2 (two) times daily with a meal., Disp: 180 tablet, Rfl: 3 Allergies  Allergen Reactions  .  Azathioprine Other (See Comments)    REACTION: affected WBC "Almost died"  . Ciprofloxacin Other (See Comments)    Unknown rxn  . Levaquin [Levofloxacin In D5w] Other (See Comments)    Unknown rxn  . Plendil [Felodipine] Other (See Comments)    Unknown rxn     Social History   Socioeconomic History  . Marital status: Divorced    Spouse name: Not on file  . Number of children: 1  . Years of education: Not on file  . Highest education level: Not on file  Occupational History  . Occupation: retired  . Occupation: Veteran  Tobacco Use  . Smoking status: Former Smoker    Packs/day: 1.00    Years: 49.00     Pack years: 49.00    Types: Cigarettes    Start date: 04/11/1956    Quit date: 04/17/2014    Years since quitting: 4.9  . Smokeless tobacco: Never Used  Substance and Sexual Activity  . Alcohol use: No    Alcohol/week: 0.0 standard drinks  . Drug use: No  . Sexual activity: Never  Other Topics Concern  . Not on file  Social History Narrative  . Not on file   Social Determinants of Health   Financial Resource Strain:   . Difficulty of Paying Living Expenses: Not on file  Food Insecurity:   . Worried About Charity fundraiser in the Last Year: Not on file  . Ran Out of Food in the Last Year: Not on file  Transportation Needs:   . Lack of Transportation (Medical): Not on file  . Lack of Transportation (Non-Medical): Not on file  Physical Activity:   . Days of Exercise per Week: Not on file  . Minutes of Exercise per Session: Not on file  Stress:   . Feeling of Stress : Not on file  Social Connections:   . Frequency of Communication with Friends and Family: Not on file  . Frequency of Social Gatherings with Friends and Family: Not on file  . Attends Religious Services: Not on file  . Active Member of Clubs or Organizations: Not on file  . Attends Archivist Meetings: Not on file  . Marital Status: Not on file  Intimate Partner Violence:   . Fear of Current or Ex-Partner: Not on file  . Emotionally Abused: Not on file  . Physically Abused: Not on file  . Sexually Abused: Not on file    Physical Exam Pulmonary:     Effort: Pulmonary effort is normal. No respiratory distress.     Breath sounds: No wheezing or rales.  Abdominal:     General: There is no distension.  Skin:    General: Skin is warm and dry.         Future Appointments  Date Time Provider [place]  03/15/2019  1:30 PM [facility] PHASE II ORIENT [facility] None  03/25/2019 11:30 AM Reed, Tiffany L, DO [facility] None  04/11/2019  9:00 AM [facility] RM 1 [facility] None  04/19/2019 10:00 AM  Larey Dresser, MD [facility] None  01/20/2020  2:15 PM [name], [name], NP [facility] None     BP 138/90 (BP Location: Right Arm, Patient Position: Sitting, Cuff Size: Normal)   Pulse 71   Resp 15   Wt 170 lb (77.1 kg)   SpO2 99%   BMI 20.69 kg/m   Weight yesterday-didn't record his reading Last visit weight-173  ATF pt CAO x4 sitting in the living room watching tv.  He had the infusion earlier today; he has no complaints or adverse affects.  He's taken all of his medications for the week. I fixed two pill boxes because I'm on vacation next week.  The second box is located in the pill bag.  He denies sob, chest pain and dizziness. rx bottles verified.    He's currently taking 5 mg of colanor which was increased from 2.5 in Nov.  His current prescription is written wrong.  The pharmacist sent in the correct prescription to [facility].    Medication ordered: tamsulosin Filled wed only (2nd box) [place] colonar  [place], EMT Paramedic [phone] 03/12/2019    ACTION: Home visit completed

## 2019-03-10 NOTE — Telephone Encounter (Signed)
Sent in [place] prescription to [facility] with updated directions. Patient is to take Corlanor 5 mg BID. Corlanor was increased on 01/17/2019, but the pharmacy never received updated Rx.   Audry Riles, PharmD, BCPS, BCCP, CPP [facility] Pharmacist [phone]

## 2019-03-11 ENCOUNTER — Ambulatory Visit (HOSPITAL_COMMUNITY): Payer: Medicare Other

## 2019-03-14 ENCOUNTER — Other Ambulatory Visit: Payer: Self-pay

## 2019-03-14 ENCOUNTER — Encounter (HOSPITAL_COMMUNITY)
Admission: RE | Admit: 2019-03-14 | Discharge: 2019-03-14 | Disposition: A | Discharge status: Home / Self Care | Payer: Self-pay | Source: Ambulatory Visit | Attending: Cardiology | Admitting: Cardiology

## 2019-03-14 ENCOUNTER — Ambulatory Visit (HOSPITAL_COMMUNITY): Payer: Medicare Other

## 2019-03-14 DIAGNOSIS — I5022 Chronic systolic (congestive) heart failure: Secondary | ICD-10-CM | POA: Insufficient documentation

## 2019-03-14 NOTE — Progress Notes (Signed)
Cardiac Rehab Note:  Successful telephone encounter to BARON PARMELEE to confirm Cardiac Rehab orientation appointment for 03/15/19 at 1:30. Nursing assessment completed. Instructions for appointment provided. Patient screening for Covid-19 negative. Patient offered [facility] but declines as he does not have a "smart phone" or smart device. Will continue to follow patient "virtually" via weekly telephone calls.  Lajoyce Tamura E. Laray Anger, BSN

## 2019-03-15 ENCOUNTER — Encounter (HOSPITAL_COMMUNITY)
Admission: RE | Admit: 2019-03-15 | Discharge: 2019-03-15 | Disposition: A | Discharge status: Home / Self Care | Payer: Self-pay | Source: Ambulatory Visit | Attending: Cardiology | Admitting: Cardiology

## 2019-03-15 VITALS — BP 110/70 | HR 75 | Temp 97.0°F | Ht 73.75 in | Wt 173.5 lb

## 2019-03-15 DIAGNOSIS — I5022 Chronic systolic (congestive) heart failure: Secondary | ICD-10-CM

## 2019-03-15 NOTE — Progress Notes (Signed)
Reviewed home exercise guidelines with patient including endpoints, temperature precautions, target heart rate and rate of perceived exertion. Pt plans to walk as his mode of home exercise. Patient is not currently walking and has some balance concerns, so pt will walk 10 minutes, 3 times a day, 2-3 days/week. Instructed pt to use stationary chair to hold on to when doing stretches. Pt voices understanding of instructions given.  Sol Passer, MS, ACSM CEP

## 2019-03-16 ENCOUNTER — Encounter (HOSPITAL_COMMUNITY): Payer: Self-pay

## 2019-03-16 ENCOUNTER — Ambulatory Visit (HOSPITAL_COMMUNITY): Payer: Medicare Other

## 2019-03-16 NOTE — Progress Notes (Signed)
Cardiac Individual Treatment Plan  Patient Details  Name: Brent [name] MRN: [id number] Date of Birth: 11/29/1945 Referring Provider:     CARDIAC REHAB PHASE II ORIENTATION from 03/15/2019 in [place]  Referring Provider  Dr Hollace Kinnier, Dr Fransico Him Covering      Initial Encounter Date:    CARDIAC REHAB PHASE II ORIENTATION from 03/15/2019 in [place]  Date  03/15/19      Visit Diagnosis: Heart failure, chronic systolic ([place])  Patient's Home Medications on Admission:  Current Outpatient Medications:  .  allopurinol (ZYLOPRIM) 100 MG tablet, Take 2 tablets (200 mg total) by mouth daily., Disp: 60 tablet, Rfl: 5 .  Cholecalciferol (VITAMIN D) 125 MCG (5000 UT) CAPS, Take 5,000 Units by mouth daily. , Disp: , Rfl:  .  divalproex (DEPAKOTE) 500 MG EC tablet, Take 1,500 mg by mouth at bedtime. , Disp: , Rfl:  .  epoetin alfa (PROCRIT) 71696 UNIT/ML injection, Inject 10,000 Units into the skin every 30 (thirty) days. , Disp: , Rfl:  .  ferrous sulfate 325 (65 FE) MG tablet, Take 325 mg by mouth every Monday, Wednesday, and Friday. , Disp: , Rfl:  .  furosemide (LASIX) 20 MG tablet, Take 20 mg by mouth every Monday, Wednesday, and Friday., Disp: , Rfl:  .  hydrALAZINE (APRESOLINE) 25 MG tablet, Take 1 tablet (25 mg total) by mouth 2 (two) times daily., Disp: 60 tablet, Rfl: 3 .  inFLIXimab (REMICADE) 100 MG injection, Infuse Remicade IV schedule 1 52m/kg every 8 weeks Premedicate with Tylenol 500-6573mby mouth and Benadryl 25-5068my mouth prior to infusion. Last PPD was on 12/2009. , Disp: 1 each, Rfl: 6 .  isosorbide mononitrate (IMDUR) 30 MG 24 hr tablet, Take 1 tablet (30 mg total) by mouth daily., Disp: 90 tablet, Rfl: 3 .  ivabradine (CORLANOR) 5 MG TABS tablet, Take 1 tablet (5 mg total) by mouth 2 (two) times daily with a meal., Disp: 180 tablet, Rfl: 3 .  loperamide (IMODIUM) 2 MG capsule, Take 1 capsule (2 mg  total) by mouth 4 (four) times daily as needed for diarrhea or loose stools., Disp: 12 capsule, Rfl: 0 .  magnesium oxide (MAG-OX) 400 MG tablet, Take 400 mg by mouth every Monday, Wednesday, and Friday., Disp: , Rfl:  .  metoprolol succinate (TOPROL-XL) 25 MG 24 hr tablet, Take 1 tablet (25 mg total) by mouth at bedtime., Disp: 90 tablet, Rfl: 3 .  OLANZapine (ZYPREXA) 7.5 MG tablet, Take 1 tablet (7.5 mg total) by mouth at bedtime., Disp: 30 tablet, Rfl: 11 .  omeprazole (PRILOSEC) 20 MG capsule, Take 20 mg by mouth daily before breakfast. , Disp: , Rfl:  .  tamsulosin (FLOMAX) 0.4 MG CAPS capsule, Take 0.4 mg by mouth daily., Disp: , Rfl:  .  vitamin B-12 (CYANOCOBALAMIN) 1000 MCG tablet, Take 1,000 mcg by mouth daily., Disp: , Rfl:   Past Medical History: Past Medical History:  Diagnosis Date  . Anemia   . Anxiety   . Benign paroxysmal positional vertigo   . Bipolar I disorder, most recent episode (or current) unspecified   . Celiac disease   . CHF (congestive heart failure) ([place] . Chronic kidney disease, stage III (moderate)   . Crohn's    Remicade q8 weeks  . Depression   . Essential and other specified forms of tremor    medication-induced Parkinson's, now resolved  . Essential hypertension, benign   .  Gout 2018  . Heart failure ([place])   . Hypertrophy of prostate without urinary obstruction and other lower urinary tract symptoms (LUTS)   . Impotence of organic origin   . Insomnia with sleep apnea, unspecified   . Iron deficiency anemia, unspecified   . 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 Neuralgia, neuritis, and radiculitis, unspecified   . Other B-complex deficiencies   . Other extrapyramidal disease and abnormal movement disorder   . Postinflammatory pulmonary fibrosis ([place])   . Tobacco use disorder   . Vertigo 2018    Tobacco Use: Social History   Tobacco Use  Smoking Status Former Smoker  . Packs/day: 1.00  . Years: 49.00  . Pack years: 49.00  . Types: Cigarettes  .  Start date: 04/11/1956  . Quit date: 04/17/2014  . Years since quitting: 4.9  Smokeless Tobacco Never Used    Labs: Recent Review Scientist, physiological    Labs for ITP Cardiac and Pulmonary Rehab Latest Ref Rng & Units 02/07/2018 05/05/2018 10/12/2018 10/12/2018 10/12/2018   Cholestrol <200 mg/dL - - - - -   LDLCALC mg/dL (calc) - - - - -   HDL >40 mg/dL - - - - -   Trlycerides <150 mg/dL - - - - -   Hemoglobin A1c 4.8 - 5.6 % - - - - -   HCO3 20.0 - 28.0 mmol/L - 22.8 18.1(L) 15.7(L) 18.3(L)   TCO2 22 - 32 mmol/L 28 24 19(L) 17(L) 19(L)   ACIDBASEDEF 0.0 - 2.0 mmol/L - 3.0(H) 7.0(H) 9.0(H) 7.0(H)   O2SAT % - 89.0 64.0 69.0 69.0      Capillary Blood Glucose: Lab Results  Component Value Date   GLUCAP 109 (H) 01/18/2019   GLUCAP 98 09/12/2018   GLUCAP 94 09/11/2018   GLUCAP 89 09/10/2018   GLUCAP 90 09/09/2018     Exercise Target Goals: Exercise Program Goal: Individual exercise prescription set using results from initial 6 min walk test and THRR while considering  patient's activity barriers and safety.   Exercise Prescription Goal: Starting with aerobic activity 30 plus minutes a day, 3 days per week for initial exercise prescription. Provide home exercise prescription and guidelines that participant acknowledges understanding prior to discharge.  Activity Barriers & Risk Stratification: Activity Barriers & Cardiac Risk Stratification - 03/15/19 1349      Activity Barriers & Cardiac Risk Stratification   Activity Barriers  Joint Problems;Balance Concerns    Cardiac Risk Stratification  High       6 Minute Walk: 6 Minute Walk    Row Name 03/15/19 1406         6 Minute Walk   Phase  Initial     Distance  1188 feet     Walk Time  6 minutes     # of Rest Breaks  0     MPH  2.25     METS  2.79     RPE  9     Perceived Dyspnea   0     VO2 Peak  9.75     Symptoms  No     Resting HR  75 bpm     Resting BP  110/70     Resting Oxygen Saturation   97 %     Exercise Oxygen  Saturation  during 6 min walk  96 %     Max Ex. HR  88 bpm     Max Ex. BP  116/68     2 Minute Post BP  118/72        Oxygen Initial Assessment:   Oxygen Re-Evaluation:   Oxygen Discharge (Final Oxygen Re-Evaluation):   Initial Exercise Prescription: Initial Exercise Prescription - 03/16/19 0900      Date of Initial Exercise RX and Referring Provider   Date  03/15/19    Referring Provider  Dr Hollace Kinnier, Dr Fransico Him Covering      Track   Minutes  30   10 minutes, 3 times/day     Prescription Details   Frequency (times per week)  2-3    Duration  Progress to 30 minutes of continuous aerobic without signs/symptoms of physical distress      Intensity   THRR 40-80% of Max Heartrate  59-118    Ratings of Perceived Exertion  11-13    Perceived Dyspnea  0-4      Progression   Progression  Continue to progress workloads to maintain intensity without signs/symptoms of physical distress.      Resistance Training   Training Prescription  Yes    Reps  10-15       Perform Capillary Blood Glucose checks as needed.  Exercise Prescription Changes:   Exercise Comments:  Exercise Comments    Row Name 03/15/19 1349           Exercise Comments  Reviewed home exercise plan with patient.          Exercise Goals and Review:  Exercise Goals    Row Name 03/15/19 1343             Exercise Goals   Increase Physical Activity  Yes       Intervention  Provide advice, education, support and counseling about physical activity/exercise needs.;Develop an individualized exercise prescription for aerobic and resistive training based on initial evaluation findings, risk stratification, comorbidities and participant's personal goals.       Expected Outcomes  Short Term: Attend rehab on a regular basis to increase amount of physical activity.;Long Term: Exercising regularly at least 3-5 days a week.;Long Term: Add in home exercise to make exercise part of routine and to  increase amount of physical activity.       Increase Strength and Stamina  Yes       Intervention  Provide advice, education, support and counseling about physical activity/exercise needs.;Develop an individualized exercise prescription for aerobic and resistive training based on initial evaluation findings, risk stratification, comorbidities and participant's personal goals.       Expected Outcomes  Short Term: Increase workloads from initial exercise prescription for resistance, speed, and METs.;Short Term: Perform resistance training exercises routinely during rehab and add in resistance training at home;Long Term: Improve cardiorespiratory fitness, muscular endurance and strength as measured by increased METs and functional capacity (6MWT)       Able to understand and use rate of perceived exertion (RPE) scale  Yes       Intervention  Provide education and explanation on how to use RPE scale       Expected Outcomes  Short Term: Able to use RPE daily in rehab to express subjective intensity level;Long Term:  Able to use RPE to guide intensity level when exercising independently       Knowledge and understanding of Target Heart Rate Range (THRR)  Yes       Intervention  Provide education and explanation of THRR including how the numbers were predicted and where they are located for reference       Expected Outcomes  Short  Term: Able to state/look up THRR;Long Term: Able to use THRR to govern intensity when exercising independently;Short Term: Able to use daily as guideline for intensity in rehab       Able to check pulse independently  -       Intervention  -       Expected Outcomes  -       Understanding of Exercise Prescription  Yes       Intervention  Provide education, explanation, and written materials on patient's individual exercise prescription       Expected Outcomes  Short Term: Able to explain program exercise prescription;Long Term: Able to explain home exercise prescription to exercise  independently          Exercise Goals Re-Evaluation :    Discharge Exercise Prescription (Final Exercise Prescription Changes):   Nutrition:  Target Goals: Understanding of nutrition guidelines, daily intake of sodium <1543m, cholesterol <2053m calories 30% from fat and 7% or less from saturated fats, daily to have 5 or more servings of fruits and vegetables.  Biometrics: Pre Biometrics - 03/15/19 1406      Pre Biometrics   Height  6' 1.75" (1.873 m)    Weight  78.7 kg    Waist Circumference  34.5 inches    Hip Circumference  38.5 inches    Waist to Hip Ratio  0.9 %    BMI (Calculated)  22.43    Triceps Skinfold  7 mm    % Body Fat  19.7 %    Grip Strength  21.5 kg    Single Leg Stand  2.25 seconds        Nutrition Therapy Plan and Nutrition Goals:   Nutrition Assessments:   Nutrition Goals Re-Evaluation:   Nutrition Goals Discharge (Final Nutrition Goals Re-Evaluation):   Psychosocial: Target Goals: Acknowledge presence or absence of significant depression and/or stress, maximize coping skills, provide positive support system. Participant is able to verbalize types and ability to use techniques and skills needed for reducing stress and depression.  Initial Review & Psychosocial Screening: Initial Psych Review & Screening - 03/15/19 1405      Initial Review   Current issues with  History of Depression;Current Stress Concerns    Source of Stress Concerns  Chronic Illness;Financial      Family Dynamics   Good Support System?  Yes   JaKamarianives alone but has a sister who lives near by and a son     Barriers   Psychosocial barriers to participate in program  The patient should benefit from training in stress management and relaxation.      Screening Interventions   Interventions  Encouraged to exercise    Expected Outcomes  Short Term goal: Utilizing psychosocial counselor, staff and physician to assist with identification of specific Stressors or current  issues interfering with healing process. Setting desired goal for each stressor or current issue identified.;Long Term Goal: Stressors or current issues are controlled or eliminated.;Short Term goal: Identification and review with participant of any Quality of Life or Depression concerns found by scoring the questionnaire.;Long Term goal: The participant improves quality of Life and PHQ9 Scores as seen by post scores and/or verbalization of changes       Quality of Life Scores: Quality of Life - 03/16/19 0937      Quality of Life   Select  Quality of Life      Quality of Life Scores   Health/Function Pre  20.87 %    Socioeconomic  Pre  30 %    Psych/Spiritual Pre  29.14 %    Family Pre  26.5 %    GLOBAL Pre  25.28 %      Scores of 19 and below usually indicate a poorer quality of life in these areas.  A difference of  2-3 points is a clinically meaningful difference.  A difference of 2-3 points in the total score of the Quality of Life Index has been associated with significant improvement in overall quality of life, self-image, physical symptoms, and general health in studies assessing change in quality of life.  PHQ-9: Recent Review Flowsheet Data    Depression screen [facility] 2/9 03/16/2019 01/19/2019 12/13/2018 09/03/2018 07/06/2018   Decreased Interest 0 0 0 0 1   Down, Depressed, Hopeless 0 0 0 0 0   PHQ - 2 Score 0 0 0 0 1     Interpretation of Total Score  Total Score Depression Severity:  1-4 = Minimal depression, 5-9 = Mild depression, 10-14 = Moderate depression, 15-19 = Moderately severe depression, 20-27 = Severe depression   Psychosocial Evaluation and Intervention:   Psychosocial Re-Evaluation:   Psychosocial Discharge (Final Psychosocial Re-Evaluation):   Vocational Rehabilitation: Provide vocational rehab assistance to qualifying candidates.   Vocational Rehab Evaluation & Intervention: Vocational Rehab - 03/16/19 1127      Initial Vocational Rehab Evaluation &  Intervention   Assessment shows need for Vocational Rehabilitation  No       Education: Education Goals: Education classes will be provided on a weekly basis, covering required topics. Participant will state understanding/return demonstration of topics presented.  Learning Barriers/Preferences: Learning Barriers/Preferences - 03/16/19 1126      Learning Barriers/Preferences   Learning Barriers  Sight   wears glasses   Learning Preferences  Individual Instruction;Skilled Demonstration       Education Topics: Hypertension, Hypertension Reduction -Define heart disease and high blood pressure. Discus how high blood pressure affects the body and ways to reduce high blood pressure.   Exercise and Your Heart -Discuss why it is important to exercise, the FITT principles of exercise, normal and abnormal responses to exercise, and how to exercise safely.   Angina -Discuss definition of angina, causes of angina, treatment of angina, and how to decrease risk of having angina.   Cardiac Medications -Review what the following cardiac medications are used for, how they affect the body, and side effects that may occur when taking the medications.  Medications include Aspirin, Beta blockers, calcium channel blockers, ACE Inhibitors, angiotensin receptor blockers, diuretics, digoxin, and antihyperlipidemics.   Congestive Heart Failure -Discuss the definition of CHF, how to live with CHF, the signs and symptoms of CHF, and how keep track of weight and sodium intake.   Heart Disease and Intimacy -Discus the effect sexual activity has on the heart, how changes occur during intimacy as we age, and safety during sexual activity.   Smoking Cessation / COPD -Discuss different methods to quit smoking, the health benefits of quitting smoking, and the definition of COPD.   Nutrition I: Fats -Discuss the types of cholesterol, what cholesterol does to the heart, and how cholesterol levels can be  controlled.   Nutrition II: Labels -Discuss the different components of food labels and how to read food label   Heart Parts/Heart Disease and PAD -Discuss the anatomy of the heart, the pathway of blood circulation through the heart, and these are affected by heart disease.   Stress I: Signs and Symptoms -Discuss the causes of stress,  how stress may lead to anxiety and depression, and ways to limit stress.   Stress II: Relaxation -Discuss different types of relaxation techniques to limit stress.   Warning Signs of Stroke / TIA -Discuss definition of a stroke, what the signs and symptoms are of a stroke, and how to identify when someone is having stroke.   Knowledge Questionnaire Score: Knowledge Questionnaire Score - 03/15/19 1550      Knowledge Questionnaire Score   Pre Score  15/24       Core Components/Risk Factors/Patient Goals at Admission: Personal Goals and Risk Factors at Admission - 03/16/19 1127      Core Components/Risk Factors/Patient Goals on Admission    Weight Management  Weight Maintenance;Yes    Expected Outcomes  Weight Maintenance: Understanding of the daily nutrition guidelines, which includes 25-35% calories from fat, 7% or less cal from saturated fats, less than 212m cholesterol, less than 1.5gm of sodium, & 5 or more servings of fruits and vegetables daily;Understanding recommendations for meals to include 15-35% energy as protein, 25-35% energy from fat, 35-60% energy from carbohydrates, less than 2034mof dietary cholesterol, 20-35 gm of total fiber daily;Understanding of distribution of calorie intake throughout the day with the consumption of 4-5 meals/snacks    Heart Failure  Yes    Intervention  Provide a combined exercise and nutrition program that is supplemented with education, support and counseling about heart failure. Directed toward relieving symptoms such as shortness of breath, decreased exercise tolerance, and extremity edema.    Expected  Outcomes  Improve functional capacity of life;Short term: Attendance in program 2-3 days a week with increased exercise capacity. Reported lower sodium intake. Reported increased fruit and vegetable intake. Reports medication compliance.;Short term: Daily weights obtained and reported for increase. Utilizing diuretic protocols set by physician.;Long term: Adoption of self-care skills and reduction of barriers for early signs and symptoms recognition and intervention leading to self-care maintenance.    Hypertension  Yes    Intervention  Provide education on lifestyle modifcations including regular physical activity/exercise, weight management, moderate sodium restriction and increased consumption of fresh fruit, vegetables, and low fat dairy, alcohol moderation, and smoking cessation.;Monitor prescription use compliance.    Expected Outcomes  Short Term: Continued assessment and intervention until BP is < 140/9031mG in hypertensive participants. < 130/34m61m in hypertensive participants with diabetes, heart failure or chronic kidney disease.;Long Term: Maintenance of blood pressure at goal levels.       Core Components/Risk Factors/Patient Goals Review:    Core Components/Risk Factors/Patient Goals at Discharge (Final Review):    ITP Comments: ITP Comments    Row Name 03/15/19 1403           ITP Comments  Dr TracFransico Him Medical Director          Comments: Patient attended orientation on 03/16/2019 to review rules and guidelines for program.  Completed 6 minute walk test, Intitial ITP, and exercise prescription.  VSS. Telemetry-Sinus Rhythm with PVC's initially frequent then decreased while patient completed his walk test.  Asymptomatic.Today's ECG tracings were sent to Dr McleClaris Gladdenice for review. Safety measures and social distancing in place per CDC guidelines.Mr HartElnoria Howard referred to cardiac rehab by Dr TiffHollace Kinniertient does not have the capability to download the virtual  cardiac rehab APP to his phone will follow up with Mr HartElnoria Howard weekly telephone calls over the next month. Appointment set up for JameJeneen Rinksmeet with MereMichaele Offernext week. Patient would benefit  form in person cardiac rehab when the program reopens.Barnet Pall, RN,BSN 03/16/2019 11:57 AM

## 2019-03-17 ENCOUNTER — Encounter (HOSPITAL_COMMUNITY): Payer: Self-pay

## 2019-03-18 ENCOUNTER — Ambulatory Visit: Payer: Medicare Other | Admitting: Internal Medicine

## 2019-03-18 ENCOUNTER — Ambulatory Visit (HOSPITAL_COMMUNITY): Payer: Medicare Other

## 2019-03-21 ENCOUNTER — Ambulatory Visit (HOSPITAL_COMMUNITY): Payer: Medicare Other

## 2019-03-22 ENCOUNTER — Other Ambulatory Visit: Payer: Self-pay

## 2019-03-22 ENCOUNTER — Inpatient Hospital Stay (HOSPITAL_COMMUNITY)
Admission: RE | Admit: 2019-03-22 | Discharge: 2019-03-22 | Disposition: A | Discharge status: Home / Self Care | Payer: Medicare Other | Source: Ambulatory Visit

## 2019-03-22 NOTE — Progress Notes (Signed)
Brent [name] 74 y.o. male Nutrition Note  Diagnosis: CHF  Past Medical History:  Diagnosis Date  . Anemia   . Anxiety   . Benign paroxysmal positional vertigo   . Bipolar I disorder, most recent episode (or current) unspecified   . Celiac disease   . CHF (congestive heart failure) ([place])   . Chronic kidney disease, stage III (moderate)   . Crohn's    Remicade q8 weeks  . Depression   . Essential and other specified forms of tremor    medication-induced Parkinson's, now resolved  . Essential hypertension, benign   . Gout 2018  . Heart failure ([place])   . Hypertrophy of prostate without urinary obstruction and other lower urinary tract symptoms (LUTS)   . Impotence of organic origin   . Insomnia with sleep apnea, unspecified   . Iron deficiency anemia, unspecified   . 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 Neuralgia, neuritis, and radiculitis, unspecified   . Other B-complex deficiencies   . Other extrapyramidal disease and abnormal movement disorder   . Postinflammatory pulmonary fibrosis ([place])   . Tobacco use disorder   . Vertigo 2018     Medications reviewed.   Current Outpatient Medications:  .  allopurinol (ZYLOPRIM) 100 MG tablet, Take 2 tablets (200 mg total) by mouth daily., Disp: 60 tablet, Rfl: 5 .  Cholecalciferol (VITAMIN D) 125 MCG (5000 UT) CAPS, Take 5,000 Units by mouth daily. , Disp: , Rfl:  .  divalproex (DEPAKOTE) 500 MG EC tablet, Take 1,500 mg by mouth at bedtime. , Disp: , Rfl:  .  epoetin alfa (PROCRIT) 75170 UNIT/ML injection, Inject 10,000 Units into the skin every 30 (thirty) days. , Disp: , Rfl:  .  ferrous sulfate 325 (65 FE) MG tablet, Take 325 mg by mouth every Monday, Wednesday, and Friday. , Disp: , Rfl:  .  furosemide (LASIX) 20 MG tablet, Take 20 mg by mouth every Monday, Wednesday, and Friday., Disp: , Rfl:  .  hydrALAZINE (APRESOLINE) 25 MG tablet, Take 1 tablet (25 mg total) by mouth 2 (two) times daily., Disp: 60 tablet, Rfl: 3 .  inFLIXimab (REMICADE)  100 MG injection, Infuse Remicade IV schedule 1 63m/kg every 8 weeks Premedicate with Tylenol 500-6511mby mouth and Benadryl 25-5047my mouth prior to infusion. Last PPD was on 12/2009. , Disp: 1 each, Rfl: 6 .  isosorbide mononitrate (IMDUR) 30 MG 24 hr tablet, Take 1 tablet (30 mg total) by mouth daily., Disp: 90 tablet, Rfl: 3 .  ivabradine (CORLANOR) 5 MG TABS tablet, Take 1 tablet (5 mg total) by mouth 2 (two) times daily with a meal., Disp: 180 tablet, Rfl: 3 .  loperamide (IMODIUM) 2 MG capsule, Take 1 capsule (2 mg total) by mouth 4 (four) times daily as needed for diarrhea or loose stools., Disp: 12 capsule, Rfl: 0 .  magnesium oxide (MAG-OX) 400 MG tablet, Take 400 mg by mouth every Monday, Wednesday, and Friday., Disp: , Rfl:  .  metoprolol succinate (TOPROL-XL) 25 MG 24 hr tablet, Take 1 tablet (25 mg total) by mouth at bedtime., Disp: 90 tablet, Rfl: 3 .  OLANZapine (ZYPREXA) 7.5 MG tablet, Take 1 tablet (7.5 mg total) by mouth at bedtime., Disp: 30 tablet, Rfl: 11 .  omeprazole (PRILOSEC) 20 MG capsule, Take 20 mg by mouth daily before breakfast. , Disp: , Rfl:  .  tamsulosin (FLOMAX) 0.4 MG CAPS capsule, Take 0.4 mg by mouth daily., Disp: , Rfl:  .  vitamin B-12 (CYANOCOBALAMIN) 1000 MCG  tablet, Take 1,000 mcg by mouth daily., Disp: , Rfl:    Ht Readings from Last 1 Encounters:  03/15/19 6' 1.75" (1.873 m)     Wt Readings from Last 3 Encounters:  03/15/19 173 lb 8 oz (78.7 kg)  03/10/19 170 lb (77.1 kg)  03/02/19 173 lb 6.4 oz (78.7 kg)     There is no height or weight on file to calculate BMI.   Social History   Tobacco Use  Smoking Status Former Smoker  . Packs/day: 1.00  . Years: 49.00  . Pack years: 49.00  . Types: Cigarettes  . Start date: 04/11/1956  . Quit date: 04/17/2014  . Years since quitting: 4.9  Smokeless Tobacco Never Used     Lab Results  Component Value Date   CHOL 138 05/04/2017   Lab Results  Component Value Date   HDL 42 05/04/2017   Lab  Results  Component Value Date   LDLCALC 71 05/04/2017   Lab Results  Component Value Date   TRIG 176 (H) 05/04/2017   Lab Results  Component Value Date   CHOLHDL 3.3 05/04/2017     Lab Results  Component Value Date   HGBA1C 4.9 08/10/2015     CBG (last 3)  No results for input(s): GLUCAP in the last 72 hours.   Nutrition Note  Spoke with pt. Nutrition Plan and Nutrition Survey goals reviewed with pt.  Pt with dx of CHF. Per discussion, pt does use canned/convenience foods often. Pt does not add salt to food. Pt does not eat out frequently.   Education provided on low sodium diet, benefits of following diet and reducing edema, salty six, how to cut back on sodium eating out.  Pt expressed understanding of the information reviewed.  Nutrition Diagnosis ? Food-and nutrition-related knowledge deficit related to lack of exposure to information as related to diagnosis of: ? CHF  Nutrition Intervention ? Pt's individual nutrition plan reviewed with pt. ? Continue client-centered nutrition education by RD, as part of interdisciplinary care.  Goal(s) Pt to limit sodium to <2000 mg/day by limiting food items with >300 mg sodium  Plan:   Will provide client-centered nutrition education as part of interdisciplinary care  Monitor and evaluate progress toward nutrition goal with team.   Michaele Offer, MS, RDN, LDN

## 2019-03-23 ENCOUNTER — Ambulatory Visit (HOSPITAL_COMMUNITY): Payer: Medicare Other

## 2019-03-23 ENCOUNTER — Other Ambulatory Visit (HOSPITAL_COMMUNITY): Payer: Self-pay

## 2019-03-23 NOTE — Progress Notes (Signed)
Paramedicine Encounter    Patient ID: Brent [name], male    DOB: 10-07-1945, 74 y.o.   MRN: [id number]    Patient Care Team: Gayland Curry, DO as PCP - General (Geriatric Medicine) [place], Peter M, MD as PCP - Cardiology (Cardiology) Larey Dresser, MD as PCP - Advanced Heart Failure (Cardiology) Elmarie Shiley, MD (Nephrology) Milus Banister, MD (Gastroenterology)  Patient Active Problem List   Diagnosis Date Noted  . Metabolic acidosis 83/15/1761  . Orthostasis 09/07/2018  . AKI (acute kidney injury) ([place])   . Immunosuppressed status ([place])   . Macrocytic anemia   . Chronic combined systolic and diastolic congestive heart failure ([place]) 06/03/2018  . Candida esophagitis ([place]) 09/22/2017  . History of smoking 30 or more pack years 01/05/2017  . Bipolar 1 disorder ([place])   . BPH (benign prostatic hyperplasia) 03/31/2013  . 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 Essential hypertension, benign 03/31/2013  . Acute renal failure superimposed on stage 3 chronic kidney disease ([place]) 06/04/2012  . 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[place]) 01/23/2010    Current Outpatient Medications:  .  allopurinol (ZYLOPRIM) 100 MG tablet, Take 2 tablets (200 mg total) by mouth daily., Disp: 60 tablet, Rfl: 5 .  Cholecalciferol (VITAMIN D) 125 MCG (5000 UT) CAPS, Take 5,000 Units by mouth daily. , Disp: , Rfl:  .  divalproex (DEPAKOTE) 500 MG EC tablet, Take 1,500 mg by mouth at bedtime. , Disp: , Rfl:  .  ferrous sulfate 325 (65 FE) MG tablet, Take 325 mg by mouth every Monday, Wednesday, and Friday. , Disp: , Rfl:  .  furosemide (LASIX) 20 MG tablet, Take 20 mg by mouth every Monday, Wednesday, and Friday., Disp: , Rfl:  .  hydrALAZINE (APRESOLINE) 25 MG tablet, Take 1 tablet (25 mg total) by mouth 2 (two) times daily., Disp: 60 tablet, Rfl: 3 .  isosorbide mononitrate (IMDUR) 30 MG 24 hr tablet, Take 1 tablet (30 mg total) by mouth daily., Disp: 90 tablet, Rfl: 3 .  ivabradine (CORLANOR) 5 MG TABS  tablet, Take 1 tablet (5 mg total) by mouth 2 (two) times daily with a meal., Disp: 180 tablet, Rfl: 3 .  loperamide (IMODIUM) 2 MG capsule, Take 1 capsule (2 mg total) by mouth 4 (four) times daily as needed for diarrhea or loose stools., Disp: 12 capsule, Rfl: 0 .  metoprolol succinate (TOPROL-XL) 25 MG 24 hr tablet, Take 1 tablet (25 mg total) by mouth at bedtime., Disp: 90 tablet, Rfl: 3 .  OLANZapine (ZYPREXA) 7.5 MG tablet, Take 1 tablet (7.5 mg total) by mouth at bedtime., Disp: 30 tablet, Rfl: 11 .  omeprazole (PRILOSEC) 20 MG capsule, Take 20 mg by mouth daily before breakfast. , Disp: , Rfl:  .  tamsulosin (FLOMAX) 0.4 MG CAPS capsule, Take 0.4 mg by mouth daily., Disp: , Rfl:  .  epoetin alfa (PROCRIT) 60737 UNIT/ML injection, Inject 10,000 Units into the skin every 30 (thirty) days. , Disp: , Rfl:  .  inFLIXimab (REMICADE) 100 MG injection, Infuse Remicade IV schedule 1 77m/kg every 8 weeks Premedicate with Tylenol 500-6556mby mouth and Benadryl 25-5015my mouth prior to infusion. Last PPD was on 12/2009. , Disp: 1 each, Rfl: 6 .  magnesium oxide (MAG-OX) 400 MG tablet, Take 400 mg by mouth every Monday, Wednesday, and Friday., Disp: , Rfl:  .  vitamin B-12 (CYANOCOBALAMIN) 1000 MCG tablet, Take 1,000 mcg by mouth daily., Disp: , Rfl:  Allergies  Allergen Reactions  .  Azathioprine Other (See Comments)    REACTION: affected WBC "Almost died"  . Ciprofloxacin Other (See Comments)    Unknown rxn  . Levaquin [Levofloxacin In D5w] Other (See Comments)    Unknown rxn  . Plendil [Felodipine] Other (See Comments)    Unknown rxn     Social History   Socioeconomic History  . Marital status: Divorced    Spouse name: Not on file  . Number of children: 1  . Years of education: 8  . Highest education level: Not on file  Occupational History  . Occupation: retired  . Occupation: Veteran  Tobacco Use  . Smoking status: Former Smoker    Packs/day: 1.00    Years: 49.00    Pack  years: 49.00    Types: Cigarettes    Start date: 04/11/1956    Quit date: 04/17/2014    Years since quitting: 4.9  . Smokeless tobacco: Never Used  Substance and Sexual Activity  . Alcohol use: No    Alcohol/week: 0.0 standard drinks  . Drug use: No  . Sexual activity: Never  Other Topics Concern  . Not on file  Social History Narrative  . Not on file   Social Determinants of Health   Financial Resource Strain:   . Difficulty of Paying Living Expenses: Not on file  Food Insecurity:   . Worried About Charity fundraiser in the Last Year: Not on file  . Ran Out of Food in the Last Year: Not on file  Transportation Needs:   . Lack of Transportation (Medical): Not on file  . Lack of Transportation (Non-Medical): Not on file  Physical Activity:   . Days of Exercise per Week: Not on file  . Minutes of Exercise per Session: Not on file  Stress:   . Feeling of Stress : Not on file  Social Connections:   . Frequency of Communication with Friends and Family: Not on file  . Frequency of Social Gatherings with Friends and Family: Not on file  . Attends Religious Services: Not on file  . Active Member of Clubs or Organizations: Not on file  . Attends Archivist Meetings: Not on file  . Marital Status: Not on file  Intimate Partner Violence:   . Fear of Current or Ex-Partner: Not on file  . Emotionally Abused: Not on file  . Physically Abused: Not on file  . Sexually Abused: Not on file    Physical Exam Pulmonary:     Effort: No respiratory distress.     Breath sounds: No wheezing.  Musculoskeletal:     Right lower leg: Edema present.     Left lower leg: Edema present.     Comments: +2 edema noted to both lower legs  Skin:    General: Skin is warm and dry.         Future Appointments  Date Time Provider [place]  03/25/2019 11:30 AM Mariea Clonts, Tiffany L, DO [facility] None  03/31/2019  2:00 PM [facility] VIRTUAL CARE [facility] None  04/07/2019  2:00 PM  [facility] VIRTUAL CARE [facility] None  04/11/2019  9:00 AM [facility] RM 1 [facility] None  04/14/2019  2:00 PM [facility] VIRTUAL CARE [facility] None  04/19/2019 10:00 AM Larey Dresser, MD [facility] None  01/20/2020  2:15 PM [name], [name], NP [facility] None     BP (!) 142/90 (BP Location: Right Arm, Patient Position: Sitting, Cuff Size: Normal)   Pulse 84   Wt 174 lb 8 oz (79.2 kg)  BMI 22.56 kg/m   Weight yesterday-172 Last visit weight-170 (03/10/19)  ATF pt CA x4 sitting in the living room watching tv.  He said that his weight has gone up over the past couple of nights.  He said that he ate a personal pan pizza from [employer] and a steak/cheese sub.  He denies sob, chest pain and dizziness.  He does has edema noted to both lower legs.  During my visit, [employer] called about his weight and blood pressure readings this morning.  Pt has taken all of his mediations for the past two weeks.  We talked about the foods that he's eating and he agrees to pay closer attention to what he's eating. rx bottles verified and pill box refilled.    Medication ordered: None  Rohail Klees, EMT Paramedic [phone] 03/24/2019    ACTION: Home visit completed

## 2019-03-24 ENCOUNTER — Encounter (HOSPITAL_COMMUNITY)
Admission: RE | Admit: 2019-03-24 | Discharge: 2019-03-24 | Disposition: A | Discharge status: Home / Self Care | Payer: Self-pay | Source: Ambulatory Visit | Attending: Cardiology | Admitting: Cardiology

## 2019-03-24 ENCOUNTER — Other Ambulatory Visit: Payer: Self-pay

## 2019-03-24 ENCOUNTER — Encounter (HOSPITAL_COMMUNITY): Payer: Self-pay

## 2019-03-24 DIAGNOSIS — I5022 Chronic systolic (congestive) heart failure: Secondary | ICD-10-CM

## 2019-03-24 NOTE — Progress Notes (Signed)
Cardiac Rehab Note:  Successful telephone encounter to [name] to follow up on his daily exercise and cardiac rehab plan of care. [name] states he is doing well. He has not walked today secondary to the outdoor temp but he has walked in his home. [name] is engaged with the para-medicine program through [facility] for CHF management. Home visit notes reviewed. He is also enrolled with the [place] [facility]. He continues to weigh daily, check his BP which is all downloaded via Ipad to [facility] for continued monitoring by an Therapist, sports. Mr Brent [name] does admit to chronic mild low ext edema R>L. [name] states he does not have compression socks. He is not familuar with his [employer] OTC benefit which allows him to order OTC products (including compression socks) quarterly. He is encouraged to elevate his legs often and maintain a low sodium diet. He is encouraged to remain active and walk daily. Mr. [name] understanding.  Plan: Will continue to monitor for compliance with exercise prescription. Will place [facility] OTC catalogue in mail and follow up next week   Saul Fabiano E. Rollene Rotunda RN, BSN . [facility] Cardiac and Pulmonary Rehab [place] Direct: [phone]

## 2019-03-25 ENCOUNTER — Encounter: Payer: Self-pay | Admitting: Internal Medicine

## 2019-03-25 ENCOUNTER — Ambulatory Visit (INDEPENDENT_AMBULATORY_CARE_PROVIDER_SITE_OTHER): Payer: Medicare Other | Admitting: Internal Medicine

## 2019-03-25 ENCOUNTER — Ambulatory Visit (HOSPITAL_COMMUNITY): Payer: Medicare Other

## 2019-03-25 DIAGNOSIS — R350 Frequency of micturition: Secondary | ICD-10-CM

## 2019-03-25 DIAGNOSIS — N1832 Chronic kidney disease, stage 3b: Secondary | ICD-10-CM

## 2019-03-25 DIAGNOSIS — K5000 Crohn's disease of small intestine without complications: Secondary | ICD-10-CM | POA: Diagnosis not present

## 2019-03-25 DIAGNOSIS — B3781 Candidal esophagitis: Secondary | ICD-10-CM

## 2019-03-25 DIAGNOSIS — F319 Bipolar disorder, unspecified: Secondary | ICD-10-CM

## 2019-03-25 DIAGNOSIS — N401 Enlarged prostate with lower urinary tract symptoms: Secondary | ICD-10-CM

## 2019-03-25 DIAGNOSIS — I5042 Chronic combined systolic (congestive) and diastolic (congestive) heart failure: Secondary | ICD-10-CM | POA: Diagnosis not present

## 2019-03-25 DIAGNOSIS — D631 Anemia in chronic kidney disease: Secondary | ICD-10-CM

## 2019-03-25 NOTE — Progress Notes (Signed)
Patient ID: Brent [name], male   DOB: 06/23/1945, 74 y.o.   MRN: [id number] This service is provided via telemedicine  No vital signs collected/recorded due to the encounter was a telemedicine visit.   Location of patient (ex: home, work):home  Patient consents to a telephone visit:  Yes  Location of the provider (ex: office, home): [facility]  Name of any referring provider:  Dr. Mariea Clonts DO  Names of all persons participating in the telemedicine service and their role in the encounter: Dr. Ileene Rubens CMA  Time spent on call:  7 min with Medical Assistant     Provider: Jonelle Sidle L. Mariea Clonts, D.O., C.M.D.  Code Status: DNR has been discussed with patient Goals of Care:  Advanced Directives 03/25/2019  Does Patient Have a Medical Advance Directive? Yes  Type of Advance Directive Out of facility DNR (pink MOST or yellow form)  Does patient want to make changes to medical advance directive? No - Patient declined  Copy of [place] in Chart? -  Would patient like information on creating a medical advance directive? -  Pre-existing out of facility DNR order (yellow form or pink MOST form) -     Chief Complaint  Patient presents with  . Medical Management of Chronic Issues    3 month follow up    HPI: Patient is a 74 y.o. male spoken with by phone today for a routine visit for med mgt.    He was having a depression spurt.  He says he is doing much better now after socializing some.  Sleeping pretty well at night.    Chronic combined systolic and diastolic chf.  He's been to orientation for cardiac rehab.  He walked quite a bit.  He was having to cart to get around the store before.  He is able to walk with a basket in the grocery store now.  Still has some swelling in his ankles and the cardiology had recommended he take lasix today and tomorrow for that.  No chest pain, no palpitations.  Not wheezing either.  Saw dietitian with [facility] on Wed and she said he best start eating  right.  He had a big mac and had told her about that.  He was eating a lot of tv dinners that are salty also.  She counseled against that, too.  He has a friend at [employer] who is now fixing his food--is not using salt.    Swallowing well.    Joints are aching--knees and arms--shoulders.  If he goes to turn over in bed, it aches.  Talked about tylenol at bedtime if needed.    He's going to start seeing the [place] psychiatrist in [place].    The lasix is working on his bladder.  He makes it in time.  It's frequent but not urgent.  No difficulty starting his stream.  Energy level is much better than at first.    Vitamin D level was low so he's started on that now--5000 IU daily through Dr. Posey Pronto.    Needs the procrit each time lately.    Past Medical History:  Diagnosis Date  . Anemia   . Anxiety   . Benign paroxysmal positional vertigo   . Bipolar I disorder, most recent episode (or current) unspecified   . Celiac disease   . CHF (congestive heart failure) ([place])   . Chronic kidney disease, stage III (moderate)   . Crohn's    Remicade q8 weeks  . Depression   .  Essential and other specified forms of tremor    medication-induced Parkinson's, now resolved  . Essential hypertension, benign   . Gout 2018  . Heart failure ([place])   . Hypertrophy of prostate without urinary obstruction and other lower urinary tract symptoms (LUTS)   . Impotence of organic origin   . Insomnia with sleep apnea, unspecified   . Iron deficiency anemia, unspecified   . 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 Neuralgia, neuritis, and radiculitis, unspecified   . Other B-complex deficiencies   . Other extrapyramidal disease and abnormal movement disorder   . Postinflammatory pulmonary fibrosis ([place])   . Tobacco use disorder   . Vertigo 2018    Past Surgical History:  Procedure Laterality Date  . CATARACT EXTRACTION, BILATERAL Bilateral 09/2018  . CHOLECYSTECTOMY  07-12-2010  . RIGHT HEART CATH N/A 10/12/2018    Procedure: RIGHT HEART CATH;  Surgeon: Jolaine Artist, MD;  Location: [facility];  Service: Cardiovascular;  Laterality: N/A;  . SMALL INTESTINE SURGERY     x 2    Allergies  Allergen Reactions  . Azathioprine Other (See Comments)    REACTION: affected WBC "Almost died"  . Ciprofloxacin Other (See Comments)    Unknown rxn  . Levaquin [Levofloxacin In D5w] Other (See Comments)    Unknown rxn  . Plendil [Felodipine] Other (See Comments)    Unknown rxn    Outpatient Encounter Medications as of 03/25/2019  Medication Sig  . allopurinol (ZYLOPRIM) 100 MG tablet Take 2 tablets (200 mg total) by mouth daily.  . Cholecalciferol (VITAMIN D) 125 MCG (5000 UT) CAPS Take 5,000 Units by mouth daily.   . divalproex (DEPAKOTE) 500 MG EC tablet Take 1,500 mg by mouth at bedtime.   Marland Kitchen epoetin alfa (PROCRIT) 29937 UNIT/ML injection Inject 10,000 Units into the skin every 30 (thirty) days.   . ferrous sulfate 325 (65 FE) MG tablet Take 325 mg by mouth every Monday, Wednesday, and Friday.   . furosemide (LASIX) 20 MG tablet Take 20 mg by mouth every Monday, Wednesday, and Friday.  . hydrALAZINE (APRESOLINE) 25 MG tablet Take 1 tablet (25 mg total) by mouth 2 (two) times daily.  Marland Kitchen inFLIXimab (REMICADE) 100 MG injection Infuse Remicade IV schedule 1 78m/kg every 8 weeks Premedicate with Tylenol 500-6522mby mouth and Benadryl 25-5062my mouth prior to infusion. Last PPD was on 12/2009.   . isosorbide mononitrate (IMDUR) 30 MG 24 hr tablet Take 1 tablet (30 mg total) by mouth daily.  . ivabradine (CORLANOR) 5 MG TABS tablet Take 1 tablet (5 mg total) by mouth 2 (two) times daily with a meal.  . loperamide (IMODIUM) 2 MG capsule Take 1 capsule (2 mg total) by mouth 4 (four) times daily as needed for diarrhea or loose stools.  . magnesium oxide (MAG-OX) 400 MG tablet Take 400 mg by mouth every Monday, Wednesday, and Friday.  . metoprolol succinate (TOPROL-XL) 25 MG 24 hr tablet Take 1 tablet (25  mg total) by mouth at bedtime.  . OMarland KitchenANZapine (ZYPREXA) 7.5 MG tablet Take 1 tablet (7.5 mg total) by mouth at bedtime.  . oMarland Kitcheneprazole (PRILOSEC) 20 MG capsule Take 20 mg by mouth daily before breakfast.   . tamsulosin (FLOMAX) 0.4 MG CAPS capsule Take 0.4 mg by mouth daily.  . vitamin B-12 (CYANOCOBALAMIN) 1000 MCG tablet Take 1,000 mcg by mouth daily.   No facility-administered encounter medications on file as of 03/25/2019.    Review of Systems:  Review of Systems  Constitutional: Negative  for chills and fever.  HENT: Positive for hearing loss.   Eyes: Negative for blurred vision.  Respiratory: Positive for shortness of breath. Negative for cough, sputum production and wheezing.   Cardiovascular: Positive for leg swelling. Negative for chest pain, palpitations, orthopnea and PND.  Gastrointestinal: Positive for diarrhea. Negative for abdominal pain, constipation, heartburn, nausea and vomiting.  Genitourinary: Positive for frequency. Negative for dysuria and urgency.  Musculoskeletal: Positive for joint pain. Negative for falls.  Neurological: Negative for dizziness and loss of consciousness.  Endo/Heme/Allergies: Does not bruise/bleed easily.  Psychiatric/Behavioral: Positive for depression. The patient is not nervous/anxious and does not have insomnia.        Depression improved now    Health Maintenance  Topic Date Due  . Hepatitis C Screening  05-Sep-1945  . TETANUS/TDAP  05/25/2021  . COLONOSCOPY  09/17/2027  . INFLUENZA VACCINE  Completed  . PNA vac Low Risk Adult  Completed    Physical Exam: This portion of visit could not be done due to non face-to-face visit (telehealth)  Labs reviewed: Basic Metabolic Panel: Recent Labs    05/07/18 0336 05/07/18 0336 05/08/18 0256 05/08/18 0256 05/09/18 0251 05/09/18 0251 05/10/18 0330 05/12/18 1610 09/01/18 9604 09/01/18 5409 09/02/18 8119 09/07/18 1708 09/08/18 0248 09/09/18 0443 01/17/19 0856 01/18/19 1231  01/28/19 0954  NA 141   < > 141   < > 143   < > 142   < > 138   < > 139   < > 139   < > 144 143 144  K 3.9   < > 3.9   < > 3.9   < > 4.1   < > 4.5   < > 4.0   < > 3.9   < > 3.4* 3.3* 4.1  CL 108   < > 108   < > 112*   < > 112*   < > 111   < > 110   < > 113*   < > 111 111 111  CO2 25   < > 26   < > 27   < > 25   < > 15*   < > 15*   < > 16*   < > 24 21* 25  GLUCOSE 100*   < > 99   < > 95   < > 86   < > 100*   < > 84   < > 95   < > 94 101* 100*  BUN 25*   < > 28*   < > 26*   < > 20   < > 51*   < > 67*   < > 57*   < > 20 21 26*  CREATININE 1.90*   < > 2.13*   < > 1.86*   < > 1.82*   < > 2.42*   < > 2.46*   < > 2.44*   < > 2.23* 2.29* 1.76*  CALCIUM 8.6*   < > 8.5*   < > 8.1*   < > 8.4*   < > 7.3*   < > 7.6*   < > 9.0   < > 8.6* 8.5* 8.8*  MG 1.4*   < > 1.9   < > 1.8   < > 1.8  --  1.0*  --  1.0*  --  1.6*  --   --   --   --   PHOS 2.7   < > 3.0  --  3.1  --  3.0  --   --   --   --   --   --   --   --   --   --   TSH 0.202*  --   --   --   --   --   --   --  0.379  --   --   --  0.744  --   --   --   --    < > = values in this interval not displayed.   Liver Function Tests: Recent Labs    09/02/18 0514 09/08/18 0248 09/09/18 0443  AST 21 13* 12*  ALT _0 ALKPHOS 70 61 50  BILITOT 0.2* 0.5 0.4  PROT 7.6 6.7 6.3*  ALBUMIN 3.6 3.0* 2.7*   No results for input(s): LIPASE, AMYLASE in the last 8760 hours. No results for input(s): AMMONIA in the last 8760 hours. CBC: Recent Labs    09/02/18 0514 09/07/18 1028 09/08/18 0248 10/07/18 1008 10/12/18 1230 10/12/18 1550 12/30/18 0000 01/07/19 0955 01/18/19 1231 02/07/19 0932 03/10/19 0813  WBC 11.0*   < > 9.0  --  10.6*  --  9.0  --  7.8  --   --   NEUTROABS 6.6  --  5.4  --   --   --  6  --   --   --   --   HGB 10.9*   < > 10.7*   < > 9.5*   < > 10.5*   < > 10.8* 10.6* 10.5*  HCT 35.3*   < > 33.6*  32.0*   < > 31.3*   < > 31*   < > 34.6* 35.3* 35.2*  MCV 106.3*   < > 104.7*  --  114.2*  --   --   --  107.8*  --   --   PLT 247    < > 201  --  202  --  169  --  176  --   --    < > = values in this interval not displayed.   Lipid Panel: No results for input(s): CHOL, HDL, LDLCALC, TRIG, CHOLHDL, LDLDIRECT in the last 8760 hours. Lab Results  Component Value Date   HGBA1C 4.9 08/10/2015    Assessment/Plan 1. Chronic combined systolic and diastolic congestive heart failure (HCC) -I'm glad he's getting cardiac rehab--seems this also helped his spirits some by having more interaction with others -lasix was just given again for short course due to increased weight and edema -cont to monitor -he's trying to do better with his low sodium diet and receiving more education on this with cardiac rehab  2. Bipolar 1 disorder ([place]) -most recently depressed -cont depakote and zyprexa -also has now switched his psychiatric care to [facility] from [place] to save him driving which should be helpful  3. Crohn's disease of small intestine without complication ([place]) -continues on remicade, controlled at present  4. Stage 3b chronic kidney disease -followed by Dr. Posey Pronto who also manages his anemia of chronic disease -Avoid nephrotoxic agents like nsaids, dose adjust renally excreted meds, hydrate.  5. Anemia in stage 3b chronic kidney disease -has been requiring his procrit every time lately and it's even more crucial that he's adequately repleted now with his CHF  6. Benign prostatic hyperplasia with urinary frequency -only frequency which is worsened by his diuretics, but no other concerning symptoms  7. Candida esophagitis ([place]) -resolved, no dysphagia or odynophagia and appetite improved  Labs/tests ordered:  No new (has been getting labs with cardiology, nephrology regularly)  Next appt:  06/20/2019  Non face-to-face time spent on televisit:  24 mins  Kentley Cedillo L. Alyjah Lovingood, D.O. [facility] [address], [place] [address] Cell Phone (Mon-Fri 8am-5pm):   [phone] On Call:  [phone] & follow prompts after 5pm & weekends Office Phone:  [phone] Office Fax:  [phone]

## 2019-03-25 NOTE — Patient Instructions (Signed)
COVID-19 Vaccine Information can be found at: [email or web address] For questions related to vaccine distribution or appointments, please email [email or web address] or call [phone].    

## 2019-03-28 ENCOUNTER — Ambulatory Visit (HOSPITAL_COMMUNITY): Payer: Medicare Other

## 2019-03-29 ENCOUNTER — Encounter (HOSPITAL_COMMUNITY): Payer: Medicare Other

## 2019-03-30 ENCOUNTER — Ambulatory Visit (HOSPITAL_COMMUNITY): Payer: Medicare Other

## 2019-03-31 ENCOUNTER — Encounter (HOSPITAL_COMMUNITY)
Admission: RE | Admit: 2019-03-31 | Discharge: 2019-03-31 | Disposition: A | Discharge status: Home / Self Care | Payer: Medicare Other | Source: Ambulatory Visit | Attending: Cardiology | Admitting: Cardiology

## 2019-03-31 ENCOUNTER — Encounter (HOSPITAL_COMMUNITY): Payer: Medicare Other

## 2019-03-31 ENCOUNTER — Other Ambulatory Visit: Payer: Self-pay

## 2019-03-31 DIAGNOSIS — I5022 Chronic systolic (congestive) heart failure: Secondary | ICD-10-CM | POA: Insufficient documentation

## 2019-03-31 NOTE — Progress Notes (Signed)
Cardiac Rehab Bi-Weekly Telephone Follow up:  Successful telephone encounter to Brent [name] to follow up on daily exercise and cardiac rehab plan of care. [name] states he is trying to walk daily although rates it as "Hard". He states he walked a mile yesterday. He continues to be followed remotely by [facility] CHF team which includes monitoring his daily weights. He is also followed weekly by the CHF para-medicine team. He recently required an increase in his lasix x 2 days secondary to fluid overload. Patient states his weight is down today to 172.0 lbs from 174.0 yesterday. He is trying to eat smarter and reading food labels for sodium content. He has met with RD and appreciative of the nutrition education he has received. RN commended patient for his lifestyle modifications.  Plan: Will follow up with patient within 2 weeks.   Annalyssa Thune E. Rollene Rotunda RN, BSN [place]. [facility] [place] Direct: [phone]

## 2019-04-01 ENCOUNTER — Ambulatory Visit (HOSPITAL_COMMUNITY): Payer: Medicare Other

## 2019-04-01 ENCOUNTER — Inpatient Hospital Stay (HOSPITAL_COMMUNITY)
Admission: RE | Admit: 2019-04-01 | Discharge: 2019-04-01 | Disposition: A | Discharge status: Home / Self Care | Payer: Medicare Other | Source: Ambulatory Visit

## 2019-04-01 NOTE — Progress Notes (Signed)
Nutrition Note: Virtual Visit  Spoke with Gillis to f/u on his diet education he received last week. He verbalized the things he had changes including label reading, rinsing canned foods, and avoiding salt. He continues to keep daily weight log and taking lasix when appropriate. He has not questions today and feels comfortable with his diet changes. Will continue to f/u with phone calls while pt is participating in virtual cardiac rehab.  Michaele Offer, MS, RDN, LDN

## 2019-04-04 ENCOUNTER — Telehealth (HOSPITAL_COMMUNITY): Payer: Self-pay

## 2019-04-04 ENCOUNTER — Encounter (HOSPITAL_COMMUNITY)
Admission: RE | Admit: 2019-04-04 | Discharge: 2019-04-04 | Disposition: A | Discharge status: Home / Self Care | Payer: Medicare Other | Source: Ambulatory Visit | Attending: Cardiology | Admitting: Cardiology

## 2019-04-04 ENCOUNTER — Ambulatory Visit (HOSPITAL_COMMUNITY): Payer: Medicare Other

## 2019-04-04 ENCOUNTER — Telehealth (HOSPITAL_COMMUNITY): Payer: Self-pay | Admitting: Cardiology

## 2019-04-04 NOTE — Telephone Encounter (Signed)
Pt insurance is active and benefits verified through [facility] Medicare Co-pay 0, DED 0/0 met, out of pocket $4,500/$35 met, co-insurance 0. no pre-authorization required. Passport, 04/04/2019_0 :45am, REF# [id number]  Will contact patient to see if he is interested in the Cardiac Rehab Program. If interested, patient will need to complete follow up appt. Once completed, patient will be contacted for scheduling upon review by the RN Navigator.

## 2019-04-04 NOTE — Telephone Encounter (Addendum)
Patient called with concerns about pill box Patient followed by para medicine program and was assigned to [facility]. Dee's last day 04/01/19 and patients pill box will be empty in two days  Advised at this time the transition for a new paramedic is taking a little longer than expected and I am unable to guarantee someone will be out to refill pill box soon.  Advised patient can come by the office and we can refill his pill box to ensure the patient is able to keep taking his medications daily  Reports he will stop by 04/05/19

## 2019-04-04 NOTE — Telephone Encounter (Signed)
Phoned pt regarding Cardiac Rehab reopening for in person exercise. Pt hesitant about coming back due to various doctor appointments. Pt will call back on Thursday to let us know his decision. Pt also states that he has 1 more day left in his pill bottle and wants to know when his "paramedic" will be returning to help him. Informed pt that he should call his doctor's office now to avoid running out of medicines and to gain clarity regarding his home help. Pt asked for Dr. Claris Gladden office number. Number was given to pt. Will follow up with pt Thursday regarding cardiac rehab decision.   Carma Lair MS, ACSM CEP 3:37 PM  04/04/2019

## 2019-04-04 NOTE — Addendum Note (Signed)
Encounter addended by: Sol Passer on: 04/04/2019 3:09 PM  Actions taken: Flowsheet data copied forward, Flowsheet accepted

## 2019-04-05 ENCOUNTER — Encounter (HOSPITAL_COMMUNITY): Payer: Self-pay

## 2019-04-05 ENCOUNTER — Telehealth (HOSPITAL_COMMUNITY): Payer: Self-pay

## 2019-04-05 DIAGNOSIS — I5022 Chronic systolic (congestive) heart failure: Secondary | ICD-10-CM

## 2019-04-05 NOTE — Progress Notes (Signed)
Cardiac Individual Treatment Plan  Patient Details  Name: Brent [name] MRN: [id number] Date of Birth: October 30, 1945 Referring Provider:     CARDIAC REHAB PHASE II ORIENTATION from 03/15/2019 in [place]  Referring Provider  Dr Hollace Kinnier, Dr Fransico Him Covering      Initial Encounter Date:    CARDIAC REHAB PHASE II ORIENTATION from 03/15/2019 in [place]  Date  03/15/19      Visit Diagnosis: Heart failure, chronic systolic ([place])  Patient's Home Medications on Admission:  Current Outpatient Medications:  .  allopurinol (ZYLOPRIM) 100 MG tablet, Take 2 tablets (200 mg total) by mouth daily., Disp: 60 tablet, Rfl: 5 .  Cholecalciferol (VITAMIN D) 125 MCG (5000 UT) CAPS, Take 5,000 Units by mouth daily. , Disp: , Rfl:  .  divalproex (DEPAKOTE) 500 MG EC tablet, Take 1,500 mg by mouth at bedtime. , Disp: , Rfl:  .  epoetin alfa (PROCRIT) 80321 UNIT/ML injection, Inject 10,000 Units into the skin every 30 (thirty) days. , Disp: , Rfl:  .  ferrous sulfate 325 (65 FE) MG tablet, Take 325 mg by mouth every Monday, Wednesday, and Friday. , Disp: , Rfl:  .  furosemide (LASIX) 20 MG tablet, Take 20 mg by mouth every Monday, Wednesday, and Friday., Disp: , Rfl:  .  hydrALAZINE (APRESOLINE) 25 MG tablet, Take 1 tablet (25 mg total) by mouth 2 (two) times daily., Disp: 60 tablet, Rfl: 3 .  inFLIXimab (REMICADE) 100 MG injection, Infuse Remicade IV schedule 1 70m/kg every 8 weeks Premedicate with Tylenol 500-6552mby mouth and Benadryl 25-5017my mouth prior to infusion. Last PPD was on 12/2009. , Disp: 1 each, Rfl: 6 .  isosorbide mononitrate (IMDUR) 30 MG 24 hr tablet, Take 1 tablet (30 mg total) by mouth daily., Disp: 90 tablet, Rfl: 3 .  ivabradine (CORLANOR) 5 MG TABS tablet, Take 1 tablet (5 mg total) by mouth 2 (two) times daily with a meal., Disp: 180 tablet, Rfl: 3 .  loperamide (IMODIUM) 2 MG capsule, Take 1 capsule (2 mg  total) by mouth 4 (four) times daily as needed for diarrhea or loose stools., Disp: 12 capsule, Rfl: 0 .  magnesium oxide (MAG-OX) 400 MG tablet, Take 400 mg by mouth every Monday, Wednesday, and Friday., Disp: , Rfl:  .  metoprolol succinate (TOPROL-XL) 25 MG 24 hr tablet, Take 1 tablet (25 mg total) by mouth at bedtime., Disp: 90 tablet, Rfl: 3 .  OLANZapine (ZYPREXA) 7.5 MG tablet, Take 1 tablet (7.5 mg total) by mouth at bedtime., Disp: 30 tablet, Rfl: 11 .  omeprazole (PRILOSEC) 20 MG capsule, Take 20 mg by mouth daily before breakfast. , Disp: , Rfl:  .  tamsulosin (FLOMAX) 0.4 MG CAPS capsule, Take 0.4 mg by mouth daily., Disp: , Rfl:  .  vitamin B-12 (CYANOCOBALAMIN) 1000 MCG tablet, Take 1,000 mcg by mouth daily., Disp: , Rfl:   Past Medical History: Past Medical History:  Diagnosis Date  . Anemia   . Anxiety   . Benign paroxysmal positional vertigo   . Bipolar I disorder, most recent episode (or current) unspecified   . Celiac disease   . CHF (congestive heart failure) ([facility] . Chronic kidney disease, stage III (moderate)   . Crohn's    Remicade q8 weeks  . Depression   . Essential and other specified forms of tremor    medication-induced Parkinson's, now resolved  . Essential hypertension, benign   .  Gout 2018  . Heart failure ([place])   . Hypertrophy of prostate without urinary obstruction and other lower urinary tract symptoms (LUTS)   . Impotence of organic origin   . Insomnia with sleep apnea, unspecified   . Iron deficiency anemia, unspecified   . 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 Neuralgia, neuritis, and radiculitis, unspecified   . Other B-complex deficiencies   . Other extrapyramidal disease and abnormal movement disorder   . Postinflammatory pulmonary fibrosis ([place])   . Tobacco use disorder   . Vertigo 2018    Tobacco Use: Social History   Tobacco Use  Smoking Status Former Smoker  . Packs/day: 1.00  . Years: 49.00  . Pack years: 49.00  . Types: Cigarettes  .  Start date: 04/11/1956  . Quit date: 04/17/2014  . Years since quitting: 4.9  Smokeless Tobacco Never Used    Labs: Recent Review Scientist, physiological    Labs for ITP Cardiac and Pulmonary Rehab Latest Ref Rng & Units 02/07/2018 05/05/2018 10/12/2018 10/12/2018 10/12/2018   Cholestrol <200 mg/dL - - - - -   LDLCALC mg/dL (calc) - - - - -   HDL >40 mg/dL - - - - -   Trlycerides <150 mg/dL - - - - -   Hemoglobin A1c 4.8 - 5.6 % - - - - -   HCO3 20.0 - 28.0 mmol/L - 22.8 18.1(L) 15.7(L) 18.3(L)   TCO2 22 - 32 mmol/L 28 24 19(L) 17(L) 19(L)   ACIDBASEDEF 0.0 - 2.0 mmol/L - 3.0(H) 7.0(H) 9.0(H) 7.0(H)   O2SAT % - 89.0 64.0 69.0 69.0      Capillary Blood Glucose: Lab Results  Component Value Date   GLUCAP 109 (H) 01/18/2019   GLUCAP 98 09/12/2018   GLUCAP 94 09/11/2018   GLUCAP 89 09/10/2018   GLUCAP 90 09/09/2018     Exercise Target Goals: Exercise Program Goal: Individual exercise prescription set using results from initial 6 min walk test and THRR while considering  patient's activity barriers and safety.   Exercise Prescription Goal: Initial exercise prescription builds to 30-45 minutes a day of aerobic activity, 2-3 days per week.  Home exercise guidelines will be given to patient during program as part of exercise prescription that the participant will acknowledge.  Activity Barriers & Risk Stratification: Activity Barriers & Cardiac Risk Stratification - 03/15/19 1349      Activity Barriers & Cardiac Risk Stratification   Activity Barriers  Joint Problems;Balance Concerns    Cardiac Risk Stratification  High       6 Minute Walk: 6 Minute Walk    Row Name 03/15/19 1406         6 Minute Walk   Phase  Initial     Distance  1188 feet     Walk Time  6 minutes     # of Rest Breaks  0     MPH  2.25     METS  2.79     RPE  9     Perceived Dyspnea   0     VO2 Peak  9.75     Symptoms  No     Resting HR  75 bpm     Resting BP  110/70     Resting Oxygen Saturation   97 %      Exercise Oxygen Saturation  during 6 min walk  96 %     Max Ex. HR  88 bpm     Max Ex. BP  116/68  2 Minute Post BP  118/72        Oxygen Initial Assessment:   Oxygen Re-Evaluation:   Oxygen Discharge (Final Oxygen Re-Evaluation):   Initial Exercise Prescription: Initial Exercise Prescription - 03/16/19 0900      Date of Initial Exercise RX and Referring Provider   Date  03/15/19    Referring Provider  Dr Hollace Kinnier, Dr Fransico Him Covering      Track   Minutes  30   10 minutes, 3 times/day     Prescription Details   Frequency (times per week)  2-3    Duration  Progress to 30 minutes of continuous aerobic without signs/symptoms of physical distress      Intensity   THRR 40-80% of Max Heartrate  59-118    Ratings of Perceived Exertion  11-13    Perceived Dyspnea  0-4      Progression   Progression  Continue to progress workloads to maintain intensity without signs/symptoms of physical distress.      Resistance Training   Training Prescription  Yes    Reps  10-15       Perform Capillary Blood Glucose checks as needed.  Exercise Prescription Changes:   Exercise Comments:  Exercise Comments    Row Name 03/15/19 1349 04/04/19 1509         Exercise Comments  Reviewed home exercise plan with patient.  Patient will be contacted to begin participation in the onsite cardiac rehab program after temporary closure due to COVID-19 pandemic.         Exercise Goals and Review:  Exercise Goals    Row Name 03/15/19 1343             Exercise Goals   Increase Physical Activity  Yes       Intervention  Provide advice, education, support and counseling about physical activity/exercise needs.;Develop an individualized exercise prescription for aerobic and resistive training based on initial evaluation findings, risk stratification, comorbidities and participant's personal goals.       Expected Outcomes  Short Term: Attend rehab on a regular basis to increase  amount of physical activity.;Long Term: Exercising regularly at least 3-5 days a week.;Long Term: Add in home exercise to make exercise part of routine and to increase amount of physical activity.       Increase Strength and Stamina  Yes       Intervention  Provide advice, education, support and counseling about physical activity/exercise needs.;Develop an individualized exercise prescription for aerobic and resistive training based on initial evaluation findings, risk stratification, comorbidities and participant's personal goals.       Expected Outcomes  Short Term: Increase workloads from initial exercise prescription for resistance, speed, and METs.;Short Term: Perform resistance training exercises routinely during rehab and add in resistance training at home;Long Term: Improve cardiorespiratory fitness, muscular endurance and strength as measured by increased METs and functional capacity (6MWT)       Able to understand and use rate of perceived exertion (RPE) scale  Yes       Intervention  Provide education and explanation on how to use RPE scale       Expected Outcomes  Short Term: Able to use RPE daily in rehab to express subjective intensity level;Long Term:  Able to use RPE to guide intensity level when exercising independently       Knowledge and understanding of Target Heart Rate Range (THRR)  Yes       Intervention  Provide education and explanation  of THRR including how the numbers were predicted and where they are located for reference       Expected Outcomes  Short Term: Able to state/look up THRR;Long Term: Able to use THRR to govern intensity when exercising independently;Short Term: Able to use daily as guideline for intensity in rehab       Able to check pulse independently  --       Intervention  --       Expected Outcomes  --       Understanding of Exercise Prescription  Yes       Intervention  Provide education, explanation, and written materials on patient's individual exercise  prescription       Expected Outcomes  Short Term: Able to explain program exercise prescription;Long Term: Able to explain home exercise prescription to exercise independently          Exercise Goals Re-Evaluation : Exercise Goals Re-Evaluation    Row Name 04/04/19 1507             Exercise Goal Re-Evaluation   Comments  Patient is participating in the virtual cardiac rehab program via weekly telephone call and making progress. Patient is trying to walk daily and rates the activity as "hard".       Expected Outcomes  Patient will transition to the onsite cardiac rehab program if interested.          Discharge Exercise Prescription (Final Exercise Prescription Changes):   Nutrition:  Target Goals: Understanding of nutrition guidelines, daily intake of sodium <1522m, cholesterol <2072m calories 30% from fat and 7% or less from saturated fats, daily to have 5 or more servings of fruits and vegetables.  Biometrics: Pre Biometrics - 03/15/19 1406      Pre Biometrics   Height  6' 1.75" (1.873 m)    Weight  173 lb 8 oz (78.7 kg)    Waist Circumference  34.5 inches    Hip Circumference  38.5 inches    Waist to Hip Ratio  0.9 %    BMI (Calculated)  22.43    Triceps Skinfold  7 mm    % Body Fat  19.7 %    Grip Strength  21.5 kg    Single Leg Stand  2.25 seconds        Nutrition Therapy Plan and Nutrition Goals: Nutrition Therapy & Goals - 03/22/19 1449      Nutrition Therapy   Diet  Low Sodium      Personal Nutrition Goals   Nutrition Goal  Pt to limit sodium to <2000 mg/day by limiting food items with >300 mg sodium      Intervention Plan   Intervention  Prescribe, educate and counsel regarding individualized specific dietary modifications aiming towards targeted core components such as weight, hypertension, lipid management, diabetes, heart failure and other comorbidities.;Nutrition handout(s) given to patient.    Expected Outcomes  Long Term Goal: Adherence to  prescribed nutrition plan.;Short Term Goal: A plan has been developed with personal nutrition goals set during dietitian appointment.       Nutrition Assessments: Nutrition Assessments - 03/22/19 1446      MEDFICTS Scores   Pre Score  37       Nutrition Goals Re-Evaluation: Nutrition Goals Re-Evaluation    [place] 03/22/19 1449             Goals   Current Weight  173 lb (78.5 kg)          Nutrition Goals Re-Evaluation:  Nutrition Goals Re-Evaluation    Row Name 03/22/19 1449             Goals   Current Weight  173 lb (78.5 kg)          Nutrition Goals Discharge (Final Nutrition Goals Re-Evaluation): Nutrition Goals Re-Evaluation - 03/22/19 1449      Goals   Current Weight  173 lb (78.5 kg)       Psychosocial: Target Goals: Acknowledge presence or absence of significant depression and/or stress, maximize coping skills, provide positive support system. Participant is able to verbalize types and ability to use techniques and skills needed for reducing stress and depression.  Initial Review & Psychosocial Screening: Initial Psych Review & Screening - 03/15/19 1405      Initial Review   Current issues with  History of Depression;Current Stress Concerns    Source of Stress Concerns  Chronic Illness;Financial      Family Dynamics   Good Support System?  Yes   Jhett lives alone but has a sister who lives near by and a son     Barriers   Psychosocial barriers to participate in program  The patient should benefit from training in stress management and relaxation.      Screening Interventions   Interventions  Encouraged to exercise    Expected Outcomes  Short Term goal: Utilizing psychosocial counselor, staff and physician to assist with identification of specific Stressors or current issues interfering with healing process. Setting desired goal for each stressor or current issue identified.;Long Term Goal: Stressors or current issues are controlled or  eliminated.;Short Term goal: Identification and review with participant of any Quality of Life or Depression concerns found by scoring the questionnaire.;Long Term goal: The participant improves quality of Life and PHQ9 Scores as seen by post scores and/or verbalization of changes       Quality of Life Scores: Quality of Life - 03/16/19 0937      Quality of Life   Select  Quality of Life      Quality of Life Scores   Health/Function Pre  20.87 %    Socioeconomic Pre  30 %    Psych/Spiritual Pre  29.14 %    Family Pre  26.5 %    GLOBAL Pre  25.28 %      Scores of 19 and below usually indicate a poorer quality of life in these areas.  A difference of  2-3 points is a clinically meaningful difference.  A difference of 2-3 points in the total score of the Quality of Life Index has been associated with significant improvement in overall quality of life, self-image, physical symptoms, and general health in studies assessing change in quality of life.  PHQ-9: Recent Review Flowsheet Data    Depression screen [facility] 2/9 03/25/2019 03/16/2019 01/19/2019 12/13/2018 09/03/2018   Decreased Interest 0 0 0 0 0   Down, Depressed, Hopeless 0 0 0 0 0   PHQ - 2 Score 0 0 0 0 0     Interpretation of Total Score  Total Score Depression Severity:  1-4 = Minimal depression, 5-9 = Mild depression, 10-14 = Moderate depression, 15-19 = Moderately severe depression, 20-27 = Severe depression   Psychosocial Evaluation and Intervention:   Psychosocial Re-Evaluation:   Psychosocial Discharge (Final Psychosocial Re-Evaluation):   Vocational Rehabilitation: Provide vocational rehab assistance to qualifying candidates.   Vocational Rehab Evaluation & Intervention: Vocational Rehab - 03/16/19 1127      Initial Vocational Rehab Evaluation & Intervention  Assessment shows need for Vocational Rehabilitation  No       Education: Education Goals: Education classes will be provided on a weekly basis,  covering required topics. Participant will state understanding/return demonstration of topics presented.  Learning Barriers/Preferences: Learning Barriers/Preferences - 03/16/19 1126      Learning Barriers/Preferences   Learning Barriers  Sight   wears glasses   Learning Preferences  Individual Instruction;Skilled Demonstration       Education Topics: Count Your Pulse:  -Group instruction provided by verbal instruction, demonstration, patient participation and written materials to support subject.  Instructors address importance of being able to find your pulse and how to count your pulse when at home without a heart monitor.  Patients get hands on experience counting their pulse with staff help and individually.   Heart Attack, Angina, and Risk Factor Modification:  -Group instruction provided by verbal instruction, video, and written materials to support subject.  Instructors address signs and symptoms of angina and heart attacks.    Also discuss risk factors for heart disease and how to make changes to improve heart health risk factors.   Functional Fitness:  -Group instruction provided by verbal instruction, demonstration, patient participation, and written materials to support subject.  Instructors address safety measures for doing things around the house.  Discuss how to get up and down off the floor, how to pick things up properly, how to safely get out of a chair without assistance, and balance training.   Meditation and Mindfulness:  -Group instruction provided by verbal instruction, patient participation, and written materials to support subject.  Instructor addresses importance of mindfulness and meditation practice to help reduce stress and improve awareness.  Instructor also leads participants through a meditation exercise.    Stretching for Flexibility and Mobility:  -Group instruction provided by verbal instruction, patient participation, and written materials to support  subject.  Instructors lead participants through series of stretches that are designed to increase flexibility thus improving mobility.  These stretches are additional exercise for major muscle groups that are typically performed during regular warm up and cool down.   Hands Only CPR:  -Group verbal, video, and participation provides a basic overview of AHA guidelines for community CPR. Role-play of emergencies allow participants the opportunity to practice calling for help and chest compression technique with discussion of AED use.   Hypertension: -Group verbal and written instruction that provides a basic overview of hypertension including the most recent diagnostic guidelines, risk factor reduction with self-care instructions and medication management.    Nutrition I class: Heart Healthy Eating:  -Group instruction provided by PowerPoint slides, verbal discussion, and written materials to support subject matter. The instructor gives an explanation and review of the Therapeutic Lifestyle Changes diet recommendations, which includes a discussion on lipid goals, dietary fat, sodium, fiber, plant stanol/sterol esters, sugar, and the components of a well-balanced, healthy diet.   Nutrition II class: Lifestyle Skills:  -Group instruction provided by PowerPoint slides, verbal discussion, and written materials to support subject matter. The instructor gives an explanation and review of label reading, grocery shopping for heart health, heart healthy recipe modifications, and ways to make healthier choices when eating out.   Diabetes Question & Answer:  -Group instruction provided by PowerPoint slides, verbal discussion, and written materials to support subject matter. The instructor gives an explanation and review of diabetes co-morbidities, pre- and post-prandial blood glucose goals, pre-exercise blood glucose goals, signs, symptoms, and treatment of hypoglycemia and hyperglycemia, and foot care  basics.  Diabetes Blitz:  -Group instruction provided by PowerPoint slides, verbal discussion, and written materials to support subject matter. The instructor gives an explanation and review of the physiology behind type 1 and type 2 diabetes, diabetes medications and rational behind using different medications, pre- and post-prandial blood glucose recommendations and Hemoglobin A1c goals, diabetes diet, and exercise including blood glucose guidelines for exercising safely.    Portion Distortion:  -Group instruction provided by PowerPoint slides, verbal discussion, written materials, and food models to support subject matter. The instructor gives an explanation of serving size versus portion size, changes in portions sizes over the last 20 years, and what consists of a serving from each food group.   Stress Management:  -Group instruction provided by verbal instruction, video, and written materials to support subject matter.  Instructors review role of stress in heart disease and how to cope with stress positively.     Exercising on Your Own:  -Group instruction provided by verbal instruction, power point, and written materials to support subject.  Instructors discuss benefits of exercise, components of exercise, frequency and intensity of exercise, and end points for exercise.  Also discuss use of nitroglycerin and activating EMS.  Review options of places to exercise outside of rehab.  Review guidelines for sex with heart disease.   Cardiac Drugs I:  -Group instruction provided by verbal instruction and written materials to support subject.  Instructor reviews cardiac drug classes: antiplatelets, anticoagulants, beta blockers, and statins.  Instructor discusses reasons, side effects, and lifestyle considerations for each drug class.   Cardiac Drugs II:  -Group instruction provided by verbal instruction and written materials to support subject.  Instructor reviews cardiac drug classes:  angiotensin converting enzyme inhibitors (ACE-I), angiotensin II receptor blockers (ARBs), nitrates, and calcium channel blockers.  Instructor discusses reasons, side effects, and lifestyle considerations for each drug class.   Anatomy and Physiology of the Circulatory System:  Group verbal and written instruction and models provide basic cardiac anatomy and physiology, with the coronary electrical and arterial systems. Review of: AMI, Angina, Valve disease, Heart Failure, Peripheral Artery Disease, Cardiac Arrhythmia, Pacemakers, and the ICD.   Other Education:  -Group or individual verbal, written, or video instructions that support the educational goals of the cardiac rehab program.   Holiday Eating Survival Tips:  -Group instruction provided by PowerPoint slides, verbal discussion, and written materials to support subject matter. The instructor gives patients tips, tricks, and techniques to help them not only survive but enjoy the holidays despite the onslaught of food that accompanies the holidays.   Knowledge Questionnaire Score: Knowledge Questionnaire Score - 03/15/19 1550      Knowledge Questionnaire Score   Pre Score  15/24       Core Components/Risk Factors/Patient Goals at Admission: Personal Goals and Risk Factors at Admission - 03/16/19 1127      Core Components/Risk Factors/Patient Goals on Admission    Weight Management  Weight Maintenance;Yes    Expected Outcomes  Weight Maintenance: Understanding of the daily nutrition guidelines, which includes 25-35% calories from fat, 7% or less cal from saturated fats, less than 289m cholesterol, less than 1.5gm of sodium, & 5 or more servings of fruits and vegetables daily;Understanding recommendations for meals to include 15-35% energy as protein, 25-35% energy from fat, 35-60% energy from carbohydrates, less than 2037mof dietary cholesterol, 20-35 gm of total fiber daily;Understanding of distribution of calorie intake throughout  the day with the consumption of 4-5 meals/snacks    Heart Failure  Yes  Intervention  Provide a combined exercise and nutrition program that is supplemented with education, support and counseling about heart failure. Directed toward relieving symptoms such as shortness of breath, decreased exercise tolerance, and extremity edema.    Expected Outcomes  Improve functional capacity of life;Short term: Attendance in program 2-3 days a week with increased exercise capacity. Reported lower sodium intake. Reported increased fruit and vegetable intake. Reports medication compliance.;Short term: Daily weights obtained and reported for increase. Utilizing diuretic protocols set by physician.;Long term: Adoption of self-care skills and reduction of barriers for early signs and symptoms recognition and intervention leading to self-care maintenance.    Hypertension  Yes    Intervention  Provide education on lifestyle modifcations including regular physical activity/exercise, weight management, moderate sodium restriction and increased consumption of fresh fruit, vegetables, and low fat dairy, alcohol moderation, and smoking cessation.;Monitor prescription use compliance.    Expected Outcomes  Short Term: Continued assessment and intervention until BP is < 140/7m HG in hypertensive participants. < 130/873mHG in hypertensive participants with diabetes, heart failure or chronic kidney disease.;Long Term: Maintenance of blood pressure at goal levels.       Core Components/Risk Factors/Patient Goals Review:  Goals and Risk Factor Review    Row Name 04/05/19 0900             Core Components/Risk Factors/Patient Goals Review   Personal Goals Review  Weight Management/Obesity;Heart Failure       Review  Patient with CAD risk factors. Followed by para-medicine program and [employer] Heart Failure Care Management. Patient continues to engage with CR RN/EP/RD via telephone encounters.       Expected Outcomes   Patient will continue to engage with CR staff and follow his CR plan of care.          Core Components/Risk Factors/Patient Goals at Discharge (Final Review):  Goals and Risk Factor Review - 04/05/19 0900      Core Components/Risk Factors/Patient Goals Review   Personal Goals Review  Weight Management/Obesity;Heart Failure    Review  Patient with CAD risk factors. Followed by para-medicine program and [employer] Medicare Heart Failure Care Management. Patient continues to engage with CR RN/EP/RD via telephone encounters.    Expected Outcomes  Patient will continue to engage with CR staff and follow his CR plan of care.       ITP Comments: ITP Comments    Row Name 03/15/19 1403 04/05/19 0857         ITP Comments  Dr TrFransico HimMD Medical Director  30 day ITP review: In person cardiac rehab exercise sessions resuming. Patient has been contacted however is unsure if he would like to return or continue to have follow up via telephone encounters from CR staff to ensure he is following his exercise prescription. Will follow up within 1 week.         Comments: see ITP comments

## 2019-04-05 NOTE — Telephone Encounter (Signed)
Spoke to [name] confirming [place] home visit for 3:00.

## 2019-04-06 ENCOUNTER — Ambulatory Visit (HOSPITAL_COMMUNITY): Payer: Medicare Other

## 2019-04-06 ENCOUNTER — Other Ambulatory Visit (HOSPITAL_COMMUNITY): Payer: Self-pay

## 2019-04-06 NOTE — Progress Notes (Signed)
Paramedicine Encounter    Patient ID: Brent [name], male    DOB: Oct 18, 1945, 74 y.o.   MRN: [id number]   Patient Care Team: Gayland Curry, DO as PCP - General (Geriatric Medicine) Martinique, Peter M, MD as PCP - Cardiology (Cardiology) Larey Dresser, MD as PCP - Advanced Heart Failure (Cardiology) Elmarie Shiley, MD (Nephrology) Milus Banister, MD (Gastroenterology)  Patient Active Problem List   Diagnosis Date Noted  . Metabolic acidosis 87/86/7672  . Orthostasis 09/07/2018  . AKI (acute kidney injury) ([place])   . Immunosuppressed status ([place])   . Macrocytic anemia   . Chronic combined systolic and diastolic congestive heart failure ([place]) 06/03/2018  . Candida esophagitis ([place]) 09/22/2017  . History of smoking 30 or more pack years 01/05/2017  . Bipolar 1 disorder ([place])   . BPH (benign prostatic hyperplasia) 03/31/2013  . 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 Essential hypertension, benign 03/31/2013  . Acute renal failure superimposed on stage 3 chronic kidney disease ([place]) 06/04/2012  . 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[place]) 01/23/2010    Current Outpatient Medications:  .  allopurinol (ZYLOPRIM) 100 MG tablet, Take 2 tablets (200 mg total) by mouth daily., Disp: 60 tablet, Rfl: 5 .  Cholecalciferol (VITAMIN D) 125 MCG (5000 UT) CAPS, Take 5,000 Units by mouth daily. , Disp: , Rfl:  .  divalproex (DEPAKOTE) 500 MG EC tablet, Take 1,500 mg by mouth at bedtime. , Disp: , Rfl:  .  epoetin alfa (PROCRIT) 09470 UNIT/ML injection, Inject 10,000 Units into the skin every 30 (thirty) days. , Disp: , Rfl:  .  ferrous sulfate 325 (65 FE) MG tablet, Take 325 mg by mouth every Monday, Wednesday, and Friday. , Disp: , Rfl:  .  furosemide (LASIX) 20 MG tablet, Take 20 mg by mouth every Monday, Wednesday, and Friday., Disp: , Rfl:  .  hydrALAZINE (APRESOLINE) 25 MG tablet, Take 1 tablet (25 mg total) by mouth 2 (two) times daily., Disp: 60 tablet, Rfl: 3 .  inFLIXimab (REMICADE) 100 MG  injection, Infuse Remicade IV schedule 1 57m/kg every 8 weeks Premedicate with Tylenol 500-6544mby mouth and Benadryl 25-5066my mouth prior to infusion. Last PPD was on 12/2009. , Disp: 1 each, Rfl: 6 .  isosorbide mononitrate (IMDUR) 30 MG 24 hr tablet, Take 1 tablet (30 mg total) by mouth daily., Disp: 90 tablet, Rfl: 3 .  ivabradine (CORLANOR) 5 MG TABS tablet, Take 1 tablet (5 mg total) by mouth 2 (two) times daily with a meal., Disp: 180 tablet, Rfl: 3 .  loperamide (IMODIUM) 2 MG capsule, Take 1 capsule (2 mg total) by mouth 4 (four) times daily as needed for diarrhea or loose stools., Disp: 12 capsule, Rfl: 0 .  magnesium oxide (MAG-OX) 400 MG tablet, Take 400 mg by mouth every Monday, Wednesday, and Friday., Disp: , Rfl:  .  metoprolol succinate (TOPROL-XL) 25 MG 24 hr tablet, Take 1 tablet (25 mg total) by mouth at bedtime., Disp: 90 tablet, Rfl: 3 .  OLANZapine (ZYPREXA) 7.5 MG tablet, Take 1 tablet (7.5 mg total) by mouth at bedtime., Disp: 30 tablet, Rfl: 11 .  omeprazole (PRILOSEC) 20 MG capsule, Take 20 mg by mouth daily before breakfast. , Disp: , Rfl:  .  tamsulosin (FLOMAX) 0.4 MG CAPS capsule, Take 0.4 mg by mouth daily., Disp: , Rfl:  .  vitamin B-12 (CYANOCOBALAMIN) 1000 MCG tablet, Take 1,000 mcg by mouth daily., Disp: , Rfl:  Allergies  Allergen Reactions  . Azathioprine  Other (See Comments)    REACTION: affected WBC "Almost died"  . Ciprofloxacin Other (See Comments)    Unknown rxn  . Levaquin [Levofloxacin In D5w] Other (See Comments)    Unknown rxn  . Plendil [Felodipine] Other (See Comments)    Unknown rxn     Social History   Socioeconomic History  . Marital status: Divorced    Spouse name: Not on file  . Number of children: 1  . Years of education: 35  . Highest education level: Not on file  Occupational History  . Occupation: retired  . Occupation: Veteran  Tobacco Use  . Smoking status: Former Smoker    Packs/day: 1.00    Years: 49.00    Pack  years: 49.00    Types: Cigarettes    Start date: 04/11/1956    Quit date: 04/17/2014    Years since quitting: 4.9  . Smokeless tobacco: Never Used  Substance and Sexual Activity  . Alcohol use: No    Alcohol/week: 0.0 standard drinks  . Drug use: No  . Sexual activity: Never  Other Topics Concern  . Not on file  Social History Narrative  . Not on file   Social Determinants of Health   Financial Resource Strain:   . Difficulty of Paying Living Expenses: Not on file  Food Insecurity:   . Worried About Charity fundraiser in the Last Year: Not on file  . Ran Out of Food in the Last Year: Not on file  Transportation Needs:   . Lack of Transportation (Medical): Not on file  . Lack of Transportation (Non-Medical): Not on file  Physical Activity:   . Days of Exercise per Week: Not on file  . Minutes of Exercise per Session: Not on file  Stress:   . Feeling of Stress : Not on file  Social Connections:   . Frequency of Communication with Friends and Family: Not on file  . Frequency of Social Gatherings with Friends and Family: Not on file  . Attends Religious Services: Not on file  . Active Member of Clubs or Organizations: Not on file  . Attends Archivist Meetings: Not on file  . Marital Status: Not on file  Intimate Partner Violence:   . Fear of Current or Ex-Partner: Not on file  . Emotionally Abused: Not on file  . Physically Abused: Not on file  . Sexually Abused: Not on file    Physical Exam Vitals reviewed.  Constitutional:      Appearance: He is normal weight.  HENT:     Head: Normocephalic.     Nose: Nose normal.     Mouth/Throat:     Mouth: Mucous membranes are moist.  Eyes:     Pupils: Pupils are equal, round, and reactive to light.  Cardiovascular:     Rate and Rhythm: Normal rate and regular rhythm.     Pulses: Normal pulses.     Heart sounds: Normal heart sounds.  Pulmonary:     Effort: Pulmonary effort is normal.     Breath sounds: Normal  breath sounds.  Abdominal:     General: Abdomen is flat.     Palpations: Abdomen is soft.  Musculoskeletal:        General: Normal range of motion.     Cervical back: Normal range of motion.     Right lower leg: No edema.     Left lower leg: No edema.  Skin:    General: Skin is warm and  dry.     Capillary Refill: Capillary refill takes less than 2 seconds.  Neurological:     Mental Status: He is alert. Mental status is at baseline.  Psychiatric:        Mood and Affect: Mood normal.     Arrived for home visit for Brent [name] who was seated in his recliner alert and oriented stating he feels "great" and reports no complaints. Brent [name] vitals were obtained and are as noted. Brent [name] denied increased shortness of breath more than his normal, he denied pain. Brent [name] did state in the morning's when he gets up he is feeling dizzy but shortly after he begins his morning routine the dizziness dissipates. Brent [name] was encouraged to hydrate well and be sure he is eating heart healthy meals. Brent [name] agreed. Medications were reviewed and verified. 2 pill boxes were filled for Brent [name]. He agreed to visit in two weeks. Home visit complete.   Meds for refill: -Olanzapine   Weight- 174.6lbs Weight last visit-  174lbs    Future Appointments  Date Time Provider Queets  04/07/2019  2:00 PM [facility] VIRTUAL CARE [facility] None  04/11/2019  9:00 AM [facility] RM 1 [facility] None  04/13/2019  1:15 PM [facility] PHASE II EXC [facility] None  04/14/2019  2:00 PM [facility] VIRTUAL CARE [facility] None  04/15/2019  1:15 PM [facility] PHASE II EXC [facility] None  04/18/2019  1:15 PM [facility] PHASE II EXC [facility] None  04/19/2019 10:00 AM Larey Dresser, MD [facility] None  04/20/2019  1:15 PM [facility] PHASE II EXC [facility] None  04/22/2019  1:15 PM [facility] PHASE II EXC [facility] None  04/25/2019  1:15 PM [facility] PHASE II EXC [facility] None  04/27/2019  1:15 PM [facility] PHASE II EXC [facility] None  04/29/2019  1:15 PM  [facility] PHASE II EXC [facility] None  05/02/2019  1:15 PM [facility] PHASE II EXC [facility] None  05/04/2019  1:15 PM [facility] PHASE II EXC [facility] None  05/06/2019  1:15 PM [facility] PHASE II EXC [facility] None  05/09/2019  1:15 PM [facility] PHASE II EXC [facility] None  05/11/2019  1:15 PM [facility] PHASE II EXC [facility] None  05/13/2019  1:15 PM [facility] PHASE II EXC [facility] None  05/16/2019  1:15 PM [facility] PHASE II EXC [facility] None  05/18/2019  1:15 PM [facility] PHASE II EXC [facility] None  05/20/2019  1:15 PM [facility] PHASE II EXC [facility] None  05/23/2019  1:15 PM [facility] PHASE II EXC [facility] None  05/25/2019  1:15 PM [facility] PHASE II EXC [facility] None  05/27/2019  1:15 PM [facility] PHASE II EXC [facility] None  05/30/2019  1:15 PM [facility] PHASE II EXC [facility] None  06/01/2019  1:15 PM [facility] PHASE II EXC [facility] None  06/03/2019  1:15 PM [facility] PHASE II EXC [facility] None  06/20/2019 11:30 AM Reed, Tiffany L, DO [facility] None  01/20/2020  2:15 PM [name], [name], NP [facility] None     ACTION: Home visit completed Next visit planned for two weeks on 2/24

## 2019-04-07 ENCOUNTER — Encounter (HOSPITAL_COMMUNITY): Payer: Medicare Other

## 2019-04-08 ENCOUNTER — Ambulatory Visit (HOSPITAL_COMMUNITY): Payer: Medicare Other

## 2019-04-11 ENCOUNTER — Other Ambulatory Visit: Payer: Self-pay

## 2019-04-11 ENCOUNTER — Ambulatory Visit (HOSPITAL_COMMUNITY)
Admission: RE | Admit: 2019-04-11 | Discharge: 2019-04-11 | Disposition: A | Discharge status: Home / Self Care | Payer: Medicare Other | Source: Ambulatory Visit | Attending: Nephrology | Admitting: Nephrology

## 2019-04-11 ENCOUNTER — Encounter (HOSPITAL_COMMUNITY): Payer: Medicare Other

## 2019-04-11 DIAGNOSIS — N183 Chronic kidney disease, stage 3 unspecified: Secondary | ICD-10-CM | POA: Diagnosis not present

## 2019-04-11 DIAGNOSIS — D631 Anemia in chronic kidney disease: Secondary | ICD-10-CM | POA: Diagnosis not present

## 2019-04-11 DIAGNOSIS — L02611 Cutaneous abscess of right foot: Secondary | ICD-10-CM | POA: Diagnosis not present

## 2019-04-11 LAB — IRON AND TIBC
Iron: 73 ug/dL (ref 45–182)
Saturation Ratios: 30 % (ref 17.9–39.5)
TIBC: 245 ug/dL — ABNORMAL LOW (ref 250–450)
UIBC: 172 ug/dL

## 2019-04-11 LAB — HEMOGLOBIN AND HEMATOCRIT, BLOOD
HCT: 35.2 % — ABNORMAL LOW (ref 39.0–52.0)
Hemoglobin: 10.6 g/dL — ABNORMAL LOW (ref 13.0–17.0)

## 2019-04-11 LAB — FERRITIN: Ferritin: 702 ng/mL — ABNORMAL HIGH (ref 24–336)

## 2019-04-11 MED ORDER — EPOETIN ALFA 10000 UNIT/ML IJ SOLN
10000.0000 [IU] | INTRAMUSCULAR | Status: DC
  Administered 2019-04-11: 10000 [IU] via SUBCUTANEOUS
  Filled 2019-04-11: qty 1

## 2019-04-11 NOTE — Progress Notes (Signed)
PATIENT CARE CENTER NOTE  Diagnosis:Anemia associated with Chronic Renal Failure, anemia associated with renal disease   Provider:Patel, Ulice Dash MD   Procedure:Epoetin Alfa (Procrit) injection   Note:Patient received sub-qProcritinjection inleftarm. Tolerated well. Labs drawn pre-injection and Hemoglobin was10.6. Patient BP was134/74.Discharge instructions given. Patient alert, oriented and ambulatoryat discharge.

## 2019-04-11 NOTE — Discharge Instructions (Signed)

## 2019-04-13 ENCOUNTER — Encounter (HOSPITAL_COMMUNITY): Payer: Medicare Other

## 2019-04-13 ENCOUNTER — Ambulatory Visit (INDEPENDENT_AMBULATORY_CARE_PROVIDER_SITE_OTHER): Payer: Medicare Other | Admitting: Otolaryngology

## 2019-04-13 ENCOUNTER — Encounter (HOSPITAL_COMMUNITY)
Admission: RE | Admit: 2019-04-13 | Discharge: 2019-04-13 | Disposition: A | Discharge status: Home / Self Care | Payer: Medicare Other | Source: Ambulatory Visit | Attending: Cardiology | Admitting: Cardiology

## 2019-04-13 ENCOUNTER — Other Ambulatory Visit: Payer: Self-pay

## 2019-04-13 VITALS — Temp 97.2°F

## 2019-04-13 DIAGNOSIS — J310 Chronic rhinitis: Secondary | ICD-10-CM

## 2019-04-13 DIAGNOSIS — I5022 Chronic systolic (congestive) heart failure: Secondary | ICD-10-CM | POA: Diagnosis not present

## 2019-04-13 NOTE — Progress Notes (Signed)
Daily Session Note  Patient Details  Name: Brent [name] MRN: [id number] Date of Birth: 02/22/1946 Referring Provider:     CARDIAC REHAB PHASE II ORIENTATION from 03/15/2019 in [facility]  Referring Provider  Dr Tiffany Reed, Dr Traci Turner Covering      Encounter Date: 04/13/2019  Check In: Session Check In - 04/13/19 1324      Check-In   Supervising physician immediately available to respond to emergencies  [facility] immediately available    Physician(s)  Dr. Gonfa    Location  [facility]    Staff Present  Olinty Richards, MS, ACSM CEP, Exercise Physiologist;Portia Payne, RN, BSN; , RN, BSN;Brittany Vance, BS, ACSM CEP, Exercise Physiologist    Virtual Visit  No    Medication changes reported      No    Fall or balance concerns reported     No    Tobacco Cessation  No Change    Warm-up and Cool-down  Performed on first and last piece of equipment    Resistance Training Performed  No    VAD Patient?  No    PAD/SET Patient?  No      Pain Assessment   Currently in Pain?  No/denies    Pain Score  0-No pain    Multiple Pain Sites  No       Capillary Blood Glucose: No results found for this or any previous visit (from the past 24 hour(s)).    Social History   Tobacco Use  Smoking Status Former Smoker  . Packs/day: 1.00  . Years: 49.00  . Pack years: 49.00  . Types: Cigarettes  . Start date: 04/11/1956  . Quit date: 04/17/2014  . Years since quitting: 4.9  Smokeless Tobacco Never Used    Goals Met:  Exercise tolerated well  Goals Unmet:  Not Applicable  Comments: Pt started cardiac rehab today.  Pt tolerated light exercise without difficulty. VSS, telemetry-SR with PVCs, asymptomatic.  Medication list reconciled. Pt denies barriers to medicaiton compliance.  PSYCHOSOCIAL ASSESSMENT:  PHQ-0. Pt exhibits positive coping skills, hopeful outlook with supportive family. No psychosocial needs identified  at this time, no psychosocial interventions necessary.   Pt oriented to exercise equipment and routine.    Understanding verbalized.    Dr. Traci Turner is Medical Director for Cardiac Rehab at [facility]. 

## 2019-04-13 NOTE — Progress Notes (Signed)
HPI: Brent [name] is a 74 y.o. male who returns today for evaluation of chronic nasal obstruction.  Patient is doing much better today with breathing using the Nasacort as well as saline rinse as needed.  He is having minimal difficulty breathing today.  He had previously had a large amount of crusting in the nose and this has resolved.  Past Medical History:  Diagnosis Date  . Anemia   . Anxiety   . Benign paroxysmal positional vertigo   . Bipolar I disorder, most recent episode (or current) unspecified   . Celiac disease   . CHF (congestive heart failure) ([place])   . Chronic kidney disease, stage III (moderate)   . Crohn's    Remicade q8 weeks  . Depression   . Essential and other specified forms of tremor    medication-induced Parkinson's, now resolved  . Essential hypertension, benign   . Gout 2018  . Heart failure ([place])   . Hypertrophy of prostate without urinary obstruction and other lower urinary tract symptoms (LUTS)   . Impotence of organic origin   . Insomnia with sleep apnea, unspecified   . Iron deficiency anemia, unspecified   . 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 Neuralgia, neuritis, and radiculitis, unspecified   . Other B-complex deficiencies   . Other extrapyramidal disease and abnormal movement disorder   . Postinflammatory pulmonary fibrosis ([place])   . Tobacco use disorder   . Vertigo 2018   Past Surgical History:  Procedure Laterality Date  . CATARACT EXTRACTION, BILATERAL Bilateral 09/2018  . CHOLECYSTECTOMY  07-12-2010  . RIGHT HEART CATH N/A 10/12/2018   Procedure: RIGHT HEART CATH;  Surgeon: Jolaine Artist, MD;  Location: [place] [facility];  Service: Cardiovascular;  Laterality: N/A;  . SMALL INTESTINE SURGERY     x 2   Social History   Socioeconomic History  . Marital status: Divorced    Spouse name: Not on file  . Number of children: 1  . Years of education: 9  . Highest education level: Not on file  Occupational History  . Occupation: retired  .  Occupation: Veteran  Tobacco Use  . Smoking status: Former Smoker    Packs/day: 1.00    Years: 49.00    Pack years: 49.00    Types: Cigarettes    Start date: 04/11/1956    Quit date: 04/17/2014    Years since quitting: 4.9  . Smokeless tobacco: Never Used  Substance and Sexual Activity  . Alcohol use: No    Alcohol/week: 0.0 standard drinks  . Drug use: No  . Sexual activity: Never  Other Topics Concern  . Not on file  Social History Narrative  . Not on file   Social Determinants of Health   Financial Resource Strain:   . Difficulty of Paying Living Expenses: Not on file  Food Insecurity:   . Worried About Charity fundraiser in the Last Year: Not on file  . Ran Out of Food in the Last Year: Not on file  Transportation Needs:   . Lack of Transportation (Medical): Not on file  . Lack of Transportation (Non-Medical): Not on file  Physical Activity:   . Days of Exercise per Week: Not on file  . Minutes of Exercise per Session: Not on file  Stress:   . Feeling of Stress : Not on file  Social Connections:   . Frequency of Communication with Friends and Family: Not on file  . Frequency of Social Gatherings with Friends and Family:  Not on file  . Attends Religious Services: Not on file  . Active Member of Clubs or Organizations: Not on file  . Attends Archivist Meetings: Not on file  . Marital Status: Not on file   Family History  Problem Relation Age of Onset  . Diabetes Mother        maternal grandmother  . Uterine cancer Mother   . Emphysema Father   . Pneumonia Maternal Grandmother   . Colon cancer Neg Hx    Allergies  Allergen Reactions  . Azathioprine Other (See Comments)    REACTION: affected WBC "Almost died"  . Ciprofloxacin Other (See Comments)    Unknown rxn  . Levaquin [Levofloxacin In D5w] Other (See Comments)    Unknown rxn  . Plendil [Felodipine] Other (See Comments)    Unknown rxn   Prior to Admission medications   Medication Sig  Start Date End Date Taking? Authorizing Provider  allopurinol (ZYLOPRIM) 100 MG tablet Take 2 tablets (200 mg total) by mouth daily. 01/05/19  Yes Reed, Tiffany L, DO  Cholecalciferol (VITAMIN D) 125 MCG (5000 UT) CAPS Take 5,000 Units by mouth daily.    Yes [provider]  divalproex (DEPAKOTE) 500 MG EC tablet Take 1,500 mg by mouth at bedtime.    Yes [provider]  epoetin alfa (PROCRIT) 29562 UNIT/ML injection Inject 10,000 Units into the skin every 30 (thirty) days.    Yes [provider]  ferrous sulfate 325 (65 FE) MG tablet Take 325 mg by mouth every Monday, Wednesday, and Friday.    Yes [provider]  furosemide (LASIX) 20 MG tablet Take 20 mg by mouth every Monday, Wednesday, and Friday.   Yes [provider]  hydrALAZINE (APRESOLINE) 25 MG tablet Take 1 tablet (25 mg total) by mouth 2 (two) times daily. 01/17/19  Yes Simmons, Brittainy M, PA-C  inFLIXimab (REMICADE) 100 MG injection Infuse Remicade IV schedule 1 58m/kg every 8 weeks Premedicate with Tylenol 500-6540mby mouth and Benadryl 25-5052my mouth prior to infusion. Last PPD was on 12/2009.  11/22/10  Yes JacMilus BanisterD  isosorbide mononitrate (IMDUR) 30 MG 24 hr tablet Take 1 tablet (30 mg total) by mouth daily. 12/28/18  Yes Bensimhon, DanShaune PascalD  ivabradine (CORLANOR) 5 MG TABS tablet Take 1 tablet (5 mg total) by mouth 2 (two) times daily with a meal. 03/10/19  Yes McLLarey DresserD  loperamide (IMODIUM) 2 MG capsule Take 1 capsule (2 mg total) by mouth 4 (four) times daily as needed for diarrhea or loose stools. 01/18/19  Yes Fawze, Mina A, PA-C  magnesium oxide (MAG-OX) 400 MG tablet Take 400 mg by mouth every Monday, Wednesday, and Friday.   Yes [provider]  metoprolol succinate (TOPROL-XL) 25 MG 24 hr tablet Take 1 tablet (25 mg total) by mouth at bedtime. 12/28/18  Yes Bensimhon, DanShaune PascalD  OLANZapine (ZYPREXA) 7.5 MG tablet Take 1 tablet (7.5 mg  total) by mouth at bedtime. 01/03/16  Yes Reed, Tiffany L, DO  omeprazole (PRILOSEC) 20 MG capsule Take 20 mg by mouth daily before breakfast.    Yes [provider]  tamsulosin (FLOMAX) 0.4 MG CAPS capsule Take 0.4 mg by mouth daily.   Yes [provider]  vitamin B-12 (CYANOCOBALAMIN) 1000 MCG tablet Take 1,000 mcg by mouth daily.   Yes [provider]     Positive ROS: Otherwise negative  All other systems have been reviewed and were otherwise  negative with the exception of those mentioned in the HPI and as above.  Physical Exam: Constitutional: Alert, well-appearing, no acute distress Ears: External ears without lesions or tenderness. Ear canals are clear bilaterally with intact, clear TMs.  Nasal: External nose without lesions. Septum with mild deformity..  Moderate rhinitis.  No signs of infection no polyps. Oral: Lips and gums without lesions. Tongue and palate mucosa without lesions. Posterior oropharynx clear. Neck: No palpable adenopathy or masses Respiratory: Breathing comfortably  Skin: No facial/neck lesions or rash noted.  Procedures  Assessment: Chronic rhinitis  Plan: He will continue with present therapy.   Radene Journey, MD

## 2019-04-14 ENCOUNTER — Encounter (HOSPITAL_COMMUNITY): Payer: Medicare Other

## 2019-04-15 ENCOUNTER — Telehealth (HOSPITAL_COMMUNITY): Payer: Self-pay | Admitting: Internal Medicine

## 2019-04-15 ENCOUNTER — Encounter (HOSPITAL_COMMUNITY): Payer: Medicare Other

## 2019-04-18 ENCOUNTER — Other Ambulatory Visit: Payer: Self-pay

## 2019-04-18 ENCOUNTER — Telehealth (HOSPITAL_COMMUNITY): Payer: Self-pay

## 2019-04-18 ENCOUNTER — Encounter (HOSPITAL_COMMUNITY)
Admission: RE | Admit: 2019-04-18 | Discharge: 2019-04-18 | Disposition: A | Discharge status: Home / Self Care | Payer: Medicare Other | Source: Ambulatory Visit | Attending: Cardiology | Admitting: Cardiology

## 2019-04-18 ENCOUNTER — Encounter (HOSPITAL_COMMUNITY): Payer: Medicare Other

## 2019-04-18 DIAGNOSIS — I5022 Chronic systolic (congestive) heart failure: Secondary | ICD-10-CM | POA: Diagnosis not present

## 2019-04-18 NOTE — Telephone Encounter (Signed)

## 2019-04-19 ENCOUNTER — Telehealth (HOSPITAL_COMMUNITY): Payer: Self-pay

## 2019-04-19 ENCOUNTER — Other Ambulatory Visit (HOSPITAL_COMMUNITY): Payer: Self-pay

## 2019-04-19 ENCOUNTER — Ambulatory Visit (HOSPITAL_COMMUNITY)
Admission: RE | Admit: 2019-04-19 | Discharge: 2019-04-19 | Disposition: A | Discharge status: Home / Self Care | Payer: Medicare Other | Source: Ambulatory Visit | Attending: Cardiology | Admitting: Cardiology

## 2019-04-19 VITALS — BP 112/44 | HR 76 | Wt 174.0 lb

## 2019-04-19 DIAGNOSIS — M109 Gout, unspecified: Secondary | ICD-10-CM | POA: Insufficient documentation

## 2019-04-19 DIAGNOSIS — J449 Chronic obstructive pulmonary disease, unspecified: Secondary | ICD-10-CM | POA: Insufficient documentation

## 2019-04-19 DIAGNOSIS — N183 Chronic kidney disease, stage 3 unspecified: Secondary | ICD-10-CM | POA: Diagnosis not present

## 2019-04-19 DIAGNOSIS — I5042 Chronic combined systolic (congestive) and diastolic (congestive) heart failure: Secondary | ICD-10-CM

## 2019-04-19 DIAGNOSIS — I5022 Chronic systolic (congestive) heart failure: Secondary | ICD-10-CM | POA: Diagnosis not present

## 2019-04-19 DIAGNOSIS — Z79899 Other long term (current) drug therapy: Secondary | ICD-10-CM | POA: Insufficient documentation

## 2019-04-19 DIAGNOSIS — K5090 Crohn's disease, unspecified, without complications: Secondary | ICD-10-CM | POA: Insufficient documentation

## 2019-04-19 DIAGNOSIS — I130 Hypertensive heart and chronic kidney disease with heart failure and stage 1 through stage 4 chronic kidney disease, or unspecified chronic kidney disease: Secondary | ICD-10-CM | POA: Insufficient documentation

## 2019-04-19 DIAGNOSIS — F319 Bipolar disorder, unspecified: Secondary | ICD-10-CM | POA: Insufficient documentation

## 2019-04-19 DIAGNOSIS — Z87891 Personal history of nicotine dependence: Secondary | ICD-10-CM | POA: Diagnosis not present

## 2019-04-19 DIAGNOSIS — Z7982 Long term (current) use of aspirin: Secondary | ICD-10-CM | POA: Insufficient documentation

## 2019-04-19 DIAGNOSIS — K900 Celiac disease: Secondary | ICD-10-CM | POA: Diagnosis not present

## 2019-04-19 LAB — BASIC METABOLIC PANEL
Anion gap: 8 (ref 5–15)
BUN: 29 mg/dL — ABNORMAL HIGH (ref 8–23)
CO2: 23 mmol/L (ref 22–32)
Calcium: 8.5 mg/dL — ABNORMAL LOW (ref 8.9–10.3)
Chloride: 113 mmol/L — ABNORMAL HIGH (ref 98–111)
Creatinine, Ser: 2.12 mg/dL — ABNORMAL HIGH (ref 0.61–1.24)
GFR calc Af Amer: 34 mL/min — ABNORMAL LOW (ref >60)
GFR calc non Af Amer: 30 mL/min — ABNORMAL LOW (ref >60)
Glucose, Bld: 101 mg/dL — ABNORMAL HIGH (ref 70–99)
Potassium: 3.9 mmol/L (ref 3.5–5.1)
Sodium: 144 mmol/L (ref 135–145)

## 2019-04-19 LAB — LIPID PANEL
Cholesterol: 140 mg/dL (ref 0–200)
HDL: 54 mg/dL (ref >40)
LDL Cholesterol: 64 mg/dL (ref 0–99)
Total CHOL/HDL Ratio: 2.6 RATIO
Triglycerides: 110 mg/dL (ref <150)
VLDL: 22 mg/dL (ref 0–40)

## 2019-04-19 LAB — CBC
HCT: 36.9 % — ABNORMAL LOW (ref 39.0–52.0)
Hemoglobin: 11.2 g/dL — ABNORMAL LOW (ref 13.0–17.0)
MCH: 32.7 pg (ref 26.0–34.0)
MCHC: 30.4 g/dL (ref 30.0–36.0)
MCV: 107.9 fL — ABNORMAL HIGH (ref 80.0–100.0)
Platelets: 149 10*3/uL — ABNORMAL LOW (ref 150–400)
RBC: 3.42 MIL/uL — ABNORMAL LOW (ref 4.22–5.81)
RDW: 13.9 % (ref 11.5–15.5)
WBC: 8.6 10*3/uL (ref 4.0–10.5)
nRBC: 0 % (ref 0.0–0.2)

## 2019-04-19 MED ORDER — SODIUM CHLORIDE 0.9% FLUSH: 3.0000 mL | Freq: Two times a day (BID) | INTRAVENOUS | Status: DC

## 2019-04-19 MED ORDER — ASPIRIN EC 81 MG PO TBEC: 81.0000 mg | DELAYED_RELEASE_TABLET | Freq: Every day | ORAL | 3 refills | Status: DC

## 2019-04-19 NOTE — Telephone Encounter (Signed)
Spoke with Nira Conn of Paramedicine, made her aware of lab results and recommendations. She will go to patients home today and make changes to pill box and encourage him to hydrate per Dr [name]. Will add labs and IV hydration to orders for pre cath.

## 2019-04-19 NOTE — Addendum Note (Signed)
Addended by: Valeda Malm on: 04/19/2019 02:42 PM   Modules accepted: Orders, SmartSet

## 2019-04-19 NOTE — Patient Instructions (Signed)
START Aspirin 42m (1 tab) daily   Labs today We will only contact you if something comes back abnormal or we need to make some changes. Otherwise no news is good news!   Your physician recommends that you schedule a follow-up appointment in: [id number]Dept: [phone]: 3[place] 04/19/2019  You are scheduled for a Cardiac Catheterization on Monday, March 1 with Dr. DLoralie Champagne  1. [facility] [address]  [phone] 5:30 AM (This time is two hours before your procedure to ensure your preparation). Free valet parking service is available.   Special note: Every effort is made to have your procedure done on time. Please understand that emergencies sometimes delay scheduled procedures.   2. Diet: Do not eat solid OR drink liquids foods after midnight.     3. Labs: done today in office   COVID SCREEN: You will need a pre procedure COVID test      WHEN:  Friday, February 23rd, 2021 at 11:15am   [facility]                      [place] [id number] This is a drive thru testing site, you will remain in your car. Be sure to get in the line FOR PROCEDURES Once you have been swabbed you will need to remain home in quarantine until you return for your procedure.   4. Medication instructions in preparation for your procedure:   HOLD LASIX 2 DAYS BEFORE PROCEDURE   HOLD ALL MEDICATIONS ON THE MORNING BEFORE YOUR PROCEDURE.  THEY WILL INSTRUCT YOU WHEN TO RESUME ALL MEDICATIONS.   5. Plan for one night stay--bring personal belongings. 6. Bring a current list of your medications and current insurance cards. 7. You MUST have a responsible person to drive you home. 8.  Someone MUST be with you the first 24 hours after you arrive home or your discharge will be delayed. 9. Please wear clothes that are easy to get on and off and wear slip-on shoes.  Thank you for allowing [facility] for you!   -- [place] [facility]

## 2019-04-19 NOTE — Telephone Encounter (Signed)
-----   Message from Larey Dresser, MD sent at 04/19/2019  2:23 PM EST ----- Creatinine is a little higher.  Would have him stop Lasix until his cath and get stat BMET on the am of his cath. Would give gentle hydration with NS 75 cc/hr when he arrives for cath.

## 2019-04-19 NOTE — Progress Notes (Signed)
PCP: Gayland Curry, DO Cardiology: Dr. Leandro Reasoner Brent [name] is a 74 y.o. with a history of Crohn's disease, celiac disease,HTN, COPD,priortobacco use, CKD 3, chronic anemia, and bipolar disorder.Diagnosed with systolic HF in 3/22 (echo with EF 30-35%).   Admitted 3/11-3/16/20 with SOB and hypoxia. Found to have PNA and required BIPAP and antibiotics. Blood cultures were negative. Echo showed newly reduced EF 30-35%. Cardiology consulted and he had nuclear stress test, which was read as "high risk" due to low EF. Per Dr Sallyanne Kuster, did not think changes were indicative of coronary disease and cath was not pursued due this and due to CKD. HF meds optimized. Limited with CKD3. He had one short run of NSVT. Also had AKI on CKD3, but creatinine improved to baseline 1.77 on day of discharge.  He was seen in ED 05/12/18 with SOB after misplacing his nebulizer. BNP was elevated, so he was given 40 mg IV lasix and discharged with lasix 20 mg BID. Referred to HF clinic.  He was admitted again in 7/20 with weakness, orthostatic hypotension, and AKI.  Cardiac meds were held.  He was noted to be severely deconditioned, and he was discharged to SNF.  He remained in SNF x 2 wks and is now home.   Echo was done in 8/20, EF remains 20-25% with mild LVH and normal RV.  He had RHC in 8/20 showing normal filling pressures and good cardiac output (surprisingly).   He returns today for followup of CHF. He seems to be doing better recently.  He still occasionally gets lightheaded with standing, but not as bad as in the past.  No falls.  He is short of breath after walking about 200 feet.  This is improved.  Creatinine has been better recently, 1.76 in 12/20.  He has knee and shoulder arthritis.  Weight has been stable.  No orthopnea/PND.  No chest pain.  He is getting his COVID shot tomorrow.   Labs (7/20): K 5.1, creatinine 2.25 Labs (8/20): K 4.8, creatinine 2.65 Labs (9/20): hgb 9.7 Labs (12/20): K 4.1,  creatinine 1.76  ECG (personally reviewed): NSR, LVH with repolarization abnormality, PVC, poor RWP   PMH: 1. Crohns disease: Gets Remicade.  2. Celiac disease.  3. COPD: Prior smoker.  - CT chest (7/20) with mild emphysema.  - PFTs (9/20): Mild obstruction, mild restriction.  - High resolution CT chest (10/20): No ILD, +chronic bronchitis.  4. CKD: Stage 3.  5. HTN 6. Bipolar disorder 7. H/o BPPV 8. Gout 9. Chronic systolic CHF: Etiology uncertain.  - Echo (3/20): EF 30-35%, mild LVH, normal RV.  - Cardiolite (3/20): EF 28%, no ischemia, fixed defects in anteroseptal and inferolateral walls.  - Echo (8/20): EF 20-25%, diffuse hypokinesis, mild LVH, normal RV size and systolic function, normal IVC size.  - RHC (8/20): mean RA 3, PA 21/10, mean PCWP 5, CI 3.2 Fick and 3.5 Thermo  Social History   Socioeconomic History  . Marital status: Divorced    Spouse name: Not on file  . Number of children: 1  . Years of education: 63  . Highest education level: Not on file  Occupational History  . Occupation: retired  . Occupation: Veteran  Tobacco Use  . Smoking status: Former Smoker    Packs/day: 1.00    Years: 49.00    Pack years: 49.00    Types: Cigarettes    Start date: 04/11/1956    Quit date: 04/17/2014    Years since quitting: 5.0  .  Smokeless tobacco: Never Used  Substance and Sexual Activity  . Alcohol use: No    Alcohol/week: 0.0 standard drinks  . Drug use: No  . Sexual activity: Never  Other Topics Concern  . Not on file  Social History Narrative  . Not on file   Social Determinants of Health   Financial Resource Strain:   . Difficulty of Paying Living Expenses: Not on file  Food Insecurity:   . Worried About Charity fundraiser in the Last Year: Not on file  . Ran Out of Food in the Last Year: Not on file  Transportation Needs:   . Lack of Transportation (Medical): Not on file  . Lack of Transportation (Non-Medical): Not on file  Physical Activity:   .  Days of Exercise per Week: Not on file  . Minutes of Exercise per Session: Not on file  Stress:   . Feeling of Stress : Not on file  Social Connections:   . Frequency of Communication with Friends and Family: Not on file  . Frequency of Social Gatherings with Friends and Family: Not on file  . Attends Religious Services: Not on file  . Active Member of Clubs or Organizations: Not on file  . Attends Archivist Meetings: Not on file  . Marital Status: Not on file  Intimate Partner Violence:   . Fear of Current or Ex-Partner: Not on file  . Emotionally Abused: Not on file  . Physically Abused: Not on file  . Sexually Abused: Not on file   Family History  Problem Relation Age of Onset  . Diabetes Mother        maternal grandmother  . Uterine cancer Mother   . Emphysema Father   . Pneumonia Maternal Grandmother   . Colon cancer Neg Hx    ROS: All systems reviewed and negative except as per HPI.   Current Outpatient Medications  Medication Sig Dispense Refill  . allopurinol (ZYLOPRIM) 100 MG tablet Take 2 tablets (200 mg total) by mouth daily. 60 tablet 5  . Cholecalciferol (VITAMIN D) 125 MCG (5000 UT) CAPS Take 5,000 Units by mouth daily.     Marland Kitchen epoetin alfa (PROCRIT) 40086 UNIT/ML injection Inject 10,000 Units into the skin every 30 (thirty) days.     . ferrous sulfate 325 (65 FE) MG tablet Take 325 mg by mouth every Monday, Wednesday, and Friday.     . hydrALAZINE (APRESOLINE) 25 MG tablet Take 1 tablet (25 mg total) by mouth 2 (two) times daily. (Patient taking differently: Take 25 mg by mouth 3 (three) times daily. ) 60 tablet 3  . inFLIXimab (REMICADE) 100 MG injection Infuse Remicade IV schedule 1 39m/kg every 8 weeks Premedicate with Tylenol 500-6567mby mouth and Benadryl 25-5043my mouth prior to infusion. Last PPD was on 12/2009.  1 each 6  . isosorbide mononitrate (IMDUR) 30 MG 24 hr tablet Take 1 tablet (30 mg total) by mouth daily. 90 tablet 3  . ivabradine  (CORLANOR) 5 MG TABS tablet Take 1 tablet (5 mg total) by mouth 2 (two) times daily with a meal. 180 tablet 3  . loperamide (IMODIUM) 2 MG capsule Take 1 capsule (2 mg total) by mouth 4 (four) times daily as needed for diarrhea or loose stools. (Patient taking differently: Take 2 mg by mouth daily. ) 12 capsule 0  . magnesium oxide (MAG-OX) 400 MG tablet Take 800 mg by mouth in the morning, at noon, and at bedtime.     .Marland Kitchen  metoprolol succinate (TOPROL-XL) 25 MG 24 hr tablet Take 1 tablet (25 mg total) by mouth at bedtime. 90 tablet 3  . OLANZapine (ZYPREXA) 7.5 MG tablet Take 1 tablet (7.5 mg total) by mouth at bedtime. 30 tablet 11  . omeprazole (PRILOSEC) 20 MG capsule Take 20 mg by mouth daily before breakfast.     . tamsulosin (FLOMAX) 0.4 MG CAPS capsule Take 0.4 mg by mouth daily.    . vitamin B-12 (CYANOCOBALAMIN) 1000 MCG tablet Take 1,000 mcg by mouth daily.    Marland Kitchen amLODipine (NORVASC) 10 MG tablet Take 10 mg by mouth daily.    Marland Kitchen aspirin EC 81 MG tablet Take 1 tablet (81 mg total) by mouth daily. 90 tablet 3  . divalproex (DEPAKOTE ER) 500 MG 24 hr tablet Take 1,500 mg by mouth at bedtime.    . furosemide (LASIX) 20 MG tablet Take 20 mg by mouth every Monday, Wednesday, and Friday.    . metoprolol tartrate (LOPRESSOR) 50 MG tablet Take 50 mg by mouth daily.    . mupirocin ointment (BACTROBAN) 2 % Place 1 application into the nose daily.    Marland Kitchen triamcinolone (NASACORT ALLERGY 24HR) 55 MCG/ACT AERO nasal inhaler Place 2 sprays into the nose daily as needed (allergies).     No current facility-administered medications for this encounter.   BP (!) 112/44   Pulse 76   Wt 78.9 kg (174 lb)   SpO2 97%   BMI 22.49 kg/m  General: NAD Neck: No JVD, no thyromegaly or thyroid nodule.  Lungs: Clear to auscultation bilaterally with normal respiratory effort. CV: Nondisplaced PMI.  Heart regular S1/S2, no S3/S4, no murmur.  No peripheral edema.  No carotid bruit.  Normal pedal pulses.  Abdomen: Soft,  nontender, no hepatosplenomegaly, no distention.  Skin: Intact without lesions or rashes.  Neurologic: Alert and oriented x 3.  Psych: Normal affect. Extremities: No clubbing or cyanosis.  HEENT: Normal.   Assessment/Plan: 1. Chronic systolic CHF: Diagnosed by echo in 3/20, EF 30-35%.  Cardiolite showed fixed defects but no ischemia.  Etiology of cardiomyopathy is uncertain, he has not had cath or MRI due to elevated creatinine. He has no chest pain.  Medication titration has been limited by orthostasis/low BP.  Echo in 8/20 showed EF 20-25% with mild LVH and normal RV function.  With resting sinus tachycardia, I was concerned for low output HF, but RHC in 8/20 showed normal cardiac output and filling pressures. Today, NYHA class II-III (improved) with no volume overload by exam.    - Continue current hydralazine/Imdur, no BP room to titrate.  - Continue Toprol XL 25 mg daily, take in evening before bed. - Continue ivabradine 5 mg bid, HR is now controlled.   - Continue Lasix three days/week.  - No ACEI/ARB/ARNI with low BP and elevated creatinine.  - Creatinine has trended lower.  He has never had a cath to diagnose presence of coronary disease as a cause of his cardiomyopathy.  We will plan for RHC/LHC to assess for coronary disease and to assess hemodynamics.  We discussed risks/benefits and he agrees to procedure. He will hold Lasix for a couple of days prior to procedure and will get hydration the morning of procedure. He will take ASA 81 mg daily.  - Will plan addition of spironolactone after cath as this will likely not affect BP much.  - He would be a difficult candidate for advanced therapies with deconditioning, renal dysfunction, and lives alone.  2. CKD: Stage 3.  BMET today.  3. COPD: Mild emphysema on prior chest CT.  PFTs in 9/20 showed mild obstruction and mild restriction.  High resolution CT chest 10/20 showed no ILD, +chronic bronchitis.  - Repeat CT in 10/21 to follow small  lung nodule given smoking history.   Loralie Champagne 04/19/2019

## 2019-04-19 NOTE — Progress Notes (Signed)
Met with Brent [name] in clinic today with Dr. [name]. Dr. [name] has scheduled a heart cath for Monday at 0730, patient must arrive at 0530. Patient was instructed to stop lasix before procedure. I removed lasix from pill box. Patient also encouraged to increase his fluid intake due to recent lab values. Patient given after visit summary with Covid testing instructions. Patient made aware of testing for Friday at 11:15. Patient understood and agreed with same. Medications were reviewed and confirmed. 2 pill boxes filled for Brent [name]. I will be following up with patient next week to see how he does with procedure and if any med changes are made complete same. Visit complete.

## 2019-04-20 ENCOUNTER — Encounter (HOSPITAL_COMMUNITY): Payer: Medicare Other

## 2019-04-20 ENCOUNTER — Telehealth (HOSPITAL_COMMUNITY): Payer: Self-pay | Admitting: Internal Medicine

## 2019-04-21 ENCOUNTER — Telehealth (HOSPITAL_COMMUNITY): Payer: Self-pay

## 2019-04-21 NOTE — Telephone Encounter (Signed)
Received phone call from patient requesting I contact PCP with updated med list. I will do same on Monday 04/25/19.

## 2019-04-22 ENCOUNTER — Encounter (HOSPITAL_COMMUNITY): Payer: Medicare Other

## 2019-04-22 ENCOUNTER — Telehealth (HOSPITAL_COMMUNITY): Payer: Self-pay | Admitting: *Deleted

## 2019-04-22 ENCOUNTER — Other Ambulatory Visit (HOSPITAL_COMMUNITY)
Admission: RE | Admit: 2019-04-22 | Discharge: 2019-04-22 | Disposition: A | Discharge status: Home / Self Care | Payer: Medicare Other | Source: Ambulatory Visit | Attending: Cardiology | Admitting: Cardiology

## 2019-04-22 DIAGNOSIS — Z20822 Contact with and (suspected) exposure to covid-19: Secondary | ICD-10-CM | POA: Diagnosis not present

## 2019-04-22 DIAGNOSIS — Z01812 Encounter for preprocedural laboratory examination: Secondary | ICD-10-CM | POA: Diagnosis not present

## 2019-04-22 LAB — SARS CORONAVIRUS 2 (TAT 6-24 HRS): SARS Coronavirus 2: NEGATIVE

## 2019-04-22 NOTE — Telephone Encounter (Signed)
Patient called to say that he will be absent from cardiac rehab today. Patient is scheduled for a right heart cath on Monday and has his COVID screen today at 1115 and will need to quarantine after that. Patient will return to exercise on Wednesday.  Sol Passer, MS, ACSM CEP

## 2019-04-25 ENCOUNTER — Observation Stay (HOSPITAL_COMMUNITY)
Admission: RE | Admit: 2019-04-25 | Discharge: 2019-04-26 | Disposition: A | Discharge status: Home / Self Care | Payer: Medicare Other | Attending: Cardiology | Admitting: Cardiology

## 2019-04-25 ENCOUNTER — Encounter (HOSPITAL_COMMUNITY): Payer: Medicare Other

## 2019-04-25 ENCOUNTER — Other Ambulatory Visit: Payer: Self-pay

## 2019-04-25 ENCOUNTER — Encounter (HOSPITAL_COMMUNITY)
Admission: RE | Disposition: A | Discharge status: Home / Self Care | Payer: Self-pay | Source: Home / Self Care | Attending: Cardiology

## 2019-04-25 DIAGNOSIS — Z825 Family history of asthma and other chronic lower respiratory diseases: Secondary | ICD-10-CM | POA: Diagnosis not present

## 2019-04-25 DIAGNOSIS — Z79899 Other long term (current) drug therapy: Secondary | ICD-10-CM | POA: Diagnosis not present

## 2019-04-25 DIAGNOSIS — J449 Chronic obstructive pulmonary disease, unspecified: Secondary | ICD-10-CM | POA: Insufficient documentation

## 2019-04-25 DIAGNOSIS — M109 Gout, unspecified: Secondary | ICD-10-CM | POA: Insufficient documentation

## 2019-04-25 DIAGNOSIS — N400 Enlarged prostate without lower urinary tract symptoms: Secondary | ICD-10-CM | POA: Insufficient documentation

## 2019-04-25 DIAGNOSIS — G47419 Narcolepsy without cataplexy: Secondary | ICD-10-CM | POA: Insufficient documentation

## 2019-04-25 DIAGNOSIS — Z881 Allergy status to other antibiotic agents status: Secondary | ICD-10-CM | POA: Insufficient documentation

## 2019-04-25 DIAGNOSIS — I130 Hypertensive heart and chronic kidney disease with heart failure and stage 1 through stage 4 chronic kidney disease, or unspecified chronic kidney disease: Secondary | ICD-10-CM | POA: Insufficient documentation

## 2019-04-25 DIAGNOSIS — I5022 Chronic systolic (congestive) heart failure: Secondary | ICD-10-CM | POA: Diagnosis not present

## 2019-04-25 DIAGNOSIS — Z888 Allergy status to other drugs, medicaments and biological substances status: Secondary | ICD-10-CM | POA: Insufficient documentation

## 2019-04-25 DIAGNOSIS — D509 Iron deficiency anemia, unspecified: Secondary | ICD-10-CM | POA: Insufficient documentation

## 2019-04-25 DIAGNOSIS — I5023 Acute on chronic systolic (congestive) heart failure: Secondary | ICD-10-CM | POA: Diagnosis present

## 2019-04-25 DIAGNOSIS — F319 Bipolar disorder, unspecified: Secondary | ICD-10-CM | POA: Insufficient documentation

## 2019-04-25 DIAGNOSIS — I5042 Chronic combined systolic (congestive) and diastolic (congestive) heart failure: Secondary | ICD-10-CM

## 2019-04-25 DIAGNOSIS — K900 Celiac disease: Secondary | ICD-10-CM | POA: Diagnosis not present

## 2019-04-25 DIAGNOSIS — G4700 Insomnia, unspecified: Secondary | ICD-10-CM | POA: Diagnosis not present

## 2019-04-25 DIAGNOSIS — N179 Acute kidney failure, unspecified: Secondary | ICD-10-CM | POA: Diagnosis not present

## 2019-04-25 DIAGNOSIS — Z87891 Personal history of nicotine dependence: Secondary | ICD-10-CM | POA: Diagnosis not present

## 2019-04-25 DIAGNOSIS — I428 Other cardiomyopathies: Principal | ICD-10-CM | POA: Insufficient documentation

## 2019-04-25 DIAGNOSIS — K5090 Crohn's disease, unspecified, without complications: Secondary | ICD-10-CM | POA: Diagnosis not present

## 2019-04-25 DIAGNOSIS — I429 Cardiomyopathy, unspecified: Secondary | ICD-10-CM | POA: Diagnosis not present

## 2019-04-25 DIAGNOSIS — N183 Chronic kidney disease, stage 3 unspecified: Secondary | ICD-10-CM | POA: Insufficient documentation

## 2019-04-25 DIAGNOSIS — Z7982 Long term (current) use of aspirin: Secondary | ICD-10-CM | POA: Diagnosis not present

## 2019-04-25 LAB — BASIC METABOLIC PANEL
Anion gap: 8 (ref 5–15)
BUN: 27 mg/dL — ABNORMAL HIGH (ref 8–23)
CO2: 23 mmol/L (ref 22–32)
Calcium: 8.3 mg/dL — ABNORMAL LOW (ref 8.9–10.3)
Chloride: 114 mmol/L — ABNORMAL HIGH (ref 98–111)
Creatinine, Ser: 2.15 mg/dL — ABNORMAL HIGH (ref 0.61–1.24)
GFR calc Af Amer: 34 mL/min — ABNORMAL LOW (ref >60)
GFR calc non Af Amer: 29 mL/min — ABNORMAL LOW (ref >60)
Glucose, Bld: 84 mg/dL (ref 70–99)
Potassium: 3.8 mmol/L (ref 3.5–5.1)
Sodium: 145 mmol/L (ref 135–145)

## 2019-04-25 SURGERY — RIGHT/LEFT HEART CATH AND CORONARY ANGIOGRAPHY
Anesthesia: LOCAL

## 2019-04-25 MED ORDER — SODIUM CHLORIDE 0.9 % IV SOLN: INTRAVENOUS | Status: DC

## 2019-04-25 MED ORDER — LOPERAMIDE HCL 2 MG PO CAPS: 2.0000 mg | ORAL_CAPSULE | Freq: Four times a day (QID) | ORAL | Status: DC | PRN

## 2019-04-25 MED ORDER — METOPROLOL TARTRATE 50 MG PO TABS
50.0000 mg | ORAL_TABLET | Freq: Every day | ORAL | Status: DC
  Administered 2019-04-26: 10:00:00 50 mg via ORAL
  Filled 2019-04-25 (×2): qty 1

## 2019-04-25 MED ORDER — ISOSORBIDE MONONITRATE ER 30 MG PO TB24
30.0000 mg | ORAL_TABLET | Freq: Every day | ORAL | Status: DC
  Administered 2019-04-25 – 2019-04-26 (×2): 30 mg via ORAL
  Filled 2019-04-25 (×2): qty 1

## 2019-04-25 MED ORDER — ASPIRIN 81 MG PO CHEW
81.0000 mg | CHEWABLE_TABLET | ORAL | Status: AC
  Administered 2019-04-25: 06:00:00 81 mg via ORAL
  Filled 2019-04-25: qty 1

## 2019-04-25 MED ORDER — ACETAMINOPHEN 325 MG PO TABS: 650.0000 mg | ORAL_TABLET | ORAL | Status: DC | PRN

## 2019-04-25 MED ORDER — IVABRADINE HCL 5 MG PO TABS
5.0000 mg | ORAL_TABLET | Freq: Two times a day (BID) | ORAL | Status: DC
  Administered 2019-04-25 – 2019-04-26 (×2): 5 mg via ORAL
  Filled 2019-04-25 (×3): qty 1

## 2019-04-25 MED ORDER — SODIUM CHLORIDE 0.9% FLUSH: 3.0000 mL | INTRAVENOUS | Status: DC | PRN

## 2019-04-25 MED ORDER — SODIUM CHLORIDE 0.9% FLUSH
3.0000 mL | Freq: Two times a day (BID) | INTRAVENOUS | Status: DC
  Administered 2019-04-25 – 2019-04-26 (×2): 3 mL via INTRAVENOUS

## 2019-04-25 MED ORDER — SODIUM CHLORIDE 0.9 % IV SOLN: 250.0000 mL | INTRAVENOUS | Status: DC | PRN

## 2019-04-25 MED ORDER — ONDANSETRON HCL 4 MG/2ML IJ SOLN: 4.0000 mg | Freq: Four times a day (QID) | INTRAMUSCULAR | Status: DC | PRN

## 2019-04-25 MED ORDER — ASPIRIN EC 81 MG PO TBEC
81.0000 mg | DELAYED_RELEASE_TABLET | Freq: Every day | ORAL | Status: DC
  Administered 2019-04-26: 07:00:00 81 mg via ORAL
  Filled 2019-04-25 (×2): qty 1

## 2019-04-25 MED ORDER — HYDRALAZINE HCL 25 MG PO TABS
25.0000 mg | ORAL_TABLET | Freq: Two times a day (BID) | ORAL | Status: DC
  Administered 2019-04-25 – 2019-04-26 (×3): 25 mg via ORAL
  Filled 2019-04-25 (×3): qty 1

## 2019-04-25 MED ORDER — METOPROLOL SUCCINATE ER 25 MG PO TB24
25.0000 mg | ORAL_TABLET | Freq: Every day | ORAL | Status: DC
  Administered 2019-04-25: 25 mg via ORAL
  Filled 2019-04-25: qty 1

## 2019-04-25 MED ORDER — MAGNESIUM OXIDE 400 (241.3 MG) MG PO TABS
800.0000 mg | ORAL_TABLET | Freq: Three times a day (TID) | ORAL | Status: DC
  Administered 2019-04-25 – 2019-04-26 (×3): 800 mg via ORAL
  Filled 2019-04-25 (×6): qty 2

## 2019-04-25 MED ORDER — ALLOPURINOL 100 MG PO TABS
200.0000 mg | ORAL_TABLET | Freq: Every day | ORAL | Status: DC
  Administered 2019-04-26: 10:00:00 200 mg via ORAL
  Filled 2019-04-25 (×2): qty 2

## 2019-04-25 MED ORDER — FERROUS SULFATE 325 (65 FE) MG PO TABS
325.0000 mg | ORAL_TABLET | ORAL | Status: DC
  Administered 2019-04-25: 13:00:00 325 mg via ORAL
  Filled 2019-04-25: qty 1

## 2019-04-25 MED ORDER — TAMSULOSIN HCL 0.4 MG PO CAPS
0.4000 mg | ORAL_CAPSULE | Freq: Every day | ORAL | Status: DC
  Administered 2019-04-25 – 2019-04-26 (×2): 0.4 mg via ORAL
  Filled 2019-04-25 (×2): qty 1

## 2019-04-25 MED ORDER — MUPIROCIN 2 % EX OINT: 1.0000 "application " | TOPICAL_OINTMENT | Freq: Every day | CUTANEOUS | Status: DC

## 2019-04-25 MED ORDER — DIVALPROEX SODIUM ER 500 MG PO TB24
1500.0000 mg | ORAL_TABLET | Freq: Every day | ORAL | Status: DC
  Administered 2019-04-25: 1500 mg via ORAL
  Filled 2019-04-25: qty 3

## 2019-04-25 MED ORDER — AMLODIPINE BESYLATE 10 MG PO TABS
10.0000 mg | ORAL_TABLET | Freq: Every day | ORAL | Status: DC
  Administered 2019-04-25 – 2019-04-26 (×2): 10 mg via ORAL
  Filled 2019-04-25 (×2): qty 1

## 2019-04-25 MED ORDER — PANTOPRAZOLE SODIUM 40 MG PO TBEC
40.0000 mg | DELAYED_RELEASE_TABLET | Freq: Every day | ORAL | Status: DC
  Administered 2019-04-25 – 2019-04-26 (×2): 40 mg via ORAL
  Filled 2019-04-25 (×2): qty 1

## 2019-04-25 MED ORDER — OLANZAPINE 5 MG PO TABS
7.5000 mg | ORAL_TABLET | Freq: Every day | ORAL | Status: DC
  Administered 2019-04-25: 22:00:00 7.5 mg via ORAL
  Filled 2019-04-25: qty 2

## 2019-04-25 MED ORDER — TRIAMCINOLONE ACETONIDE 55 MCG/ACT NA AERO: 2.0000 | INHALATION_SPRAY | Freq: Every day | NASAL | Status: DC | PRN

## 2019-04-25 NOTE — Progress Notes (Addendum)
Per DR [name] decrease IVF from 75 mL/hr to 50 mL/hr at midnight

## 2019-04-25 NOTE — H&P (Addendum)
Advanced Heart Failure Team History and Physical Note   PCP:  Gayland Curry, DO  PCP-Cardiology: Peter Martinique, MD     Reason for Admission: AKI    HPI:    Brent [name] a 74 y.o.with a history of Crohn's disease, celiac disease,HTN, COPD,priortobacco use, CKD 3, chronic anemia, and bipolar disorder.Diagnosed with systolic HF in 8/93 (echo with EF 30-35%).   Admitted 3/11-3/16/20 with SOB and hypoxia. Found to have PNA and required BIPAP and antibiotics. Blood cultures were negative. Echo showed newly reduced EF 30-35%. Cardiology consulted and he had nuclear stress test, which was read as "high risk" due to low EF. Per Dr Sallyanne Kuster, did not think changes were indicative of coronary disease and cath was not pursued due this and due to CKD. HF meds optimized. Limited with CKD3. He had one short run of NSVT. Also had AKI on CKD3, but creatinine improved to baseline 1.77 on day of discharge.  He was seen in ED 05/12/18 with SOB after misplacing his nebulizer. BNP was elevated, so he was given 40 mg IV lasix and discharged with lasix 20 mg BID. Referred to HF clinic.  He was admitted again in 7/20 with weakness, orthostatic hypotension, and AKI.  Cardiac meds were held.  He was noted to be severely deconditioned, and he was discharged to SNF.  He remained in SNF x 2 wks and is now home.   Echo was done in 8/20, EF remains 20-25% with mild LVH and normal RV.  He had RHC in 8/20 showing normal filling pressures and good cardiac output (surprisingly).   Presented today for cath but had elevated creatinine. Creatinine up to 2.1 Denies chest pain. Mild SOB with exertion.    Review of Systems: [y] = yes, [ ]  = no   General: Weight gain [ ] ; Weight loss [ ] ; Anorexia [ ] ; Fatigue [Y ]; Fever [ ] ; Chills [ ] ; Weakness [ ]   Cardiac: Chest pain/pressure [ ] ; Resting SOB [ ] ; Exertional SOB [ Y]; Orthopnea [ ] ; Pedal Edema [ ] ; Palpitations [ ] ; Syncope [ ] ; Presyncope [ ] ; Paroxysmal  nocturnal dyspnea[ ]   Pulmonary: Cough [ ] ; Wheezing[ ] ; Hemoptysis[ ] ; Sputum [ ] ; Snoring [ ]   GI: Vomiting[ ] ; Dysphagia[ ] ; Melena[ ] ; Hematochezia [ ] ; Heartburn[ ] ; Abdominal pain [ ] ; Constipation [ ] ; Diarrhea [ ] ; BRBPR [ ]   GU: Hematuria[ ] ; Dysuria [ ] ; Nocturia[ ]   Vascular: Pain in legs with walking [ ] ; Pain in feet with lying flat [ ] ; Non-healing sores [ ] ; Stroke [ ] ; TIA [ ] ; Slurred speech [ ] ;  Neuro: Headaches[ ] ; Vertigo[ ] ; Seizures[ ] ; Paresthesias[ ] ;Blurred vision [ ] ; Diplopia [ ] ; Vision changes [ ]   Ortho/Skin: Arthritis [ ] ; Joint pain [ Y]; Muscle pain [ ] ; Joint swelling [ ] ; Back Pain [Y ]; Rash [ ]   Psych: Depression[ ] ; Anxiety[ ]   Heme: Bleeding problems [ ] ; Clotting disorders [ ] ; Anemia [ ]   Endocrine: Diabetes [ ] ; Thyroid dysfunction[ ]    Home Medications Prior to Admission medications   Medication Sig Start Date End Date Taking? Authorizing Provider  allopurinol (ZYLOPRIM) 100 MG tablet Take 2 tablets (200 mg total) by mouth daily. 01/05/19  Yes Reed, Tiffany L, DO  amLODipine (NORVASC) 10 MG tablet Take 10 mg by mouth daily.   Yes [provider]  Cholecalciferol (VITAMIN D) 125 MCG (5000 UT) CAPS Take 5,000 Units by mouth daily.    Yes [provider]  divalproex (DEPAKOTE ER) 500 MG 24 hr tablet Take 1,500 mg by mouth at bedtime.   Yes [provider]  epoetin alfa (PROCRIT) 09811 UNIT/ML injection Inject 10,000 Units into the skin every 30 (thirty) days.    Yes [provider]  ferrous sulfate 325 (65 FE) MG tablet Take 325 mg by mouth every Monday, Wednesday, and Friday.    Yes [provider]  furosemide (LASIX) 20 MG tablet Take 20 mg by mouth every Monday, Wednesday, and Friday.   Yes [provider]  hydrALAZINE (APRESOLINE) 25 MG tablet Take 1 tablet (25 mg total) by mouth 2 (two) times daily. Patient taking differently: Take 25 mg by mouth 3 (three) times daily.  01/17/19  Yes Simmons,  Brittainy M, PA-C  inFLIXimab (REMICADE) 100 MG injection Infuse Remicade IV schedule 1 86m/kg every 8 weeks Premedicate with Tylenol 500-6529mby mouth and Benadryl 25-5069my mouth prior to infusion. Last PPD was on 12/2009.  11/22/10  Yes JacMilus BanisterD  isosorbide mononitrate (IMDUR) 30 MG 24 hr tablet Take 1 tablet (30 mg total) by mouth daily. 12/28/18  Yes Bensimhon, DanShaune PascalD  ivabradine (CORLANOR) 5 MG TABS tablet Take 1 tablet (5 mg total) by mouth 2 (two) times daily with a meal. 03/10/19  Yes McLLarey DresserD  loperamide (IMODIUM) 2 MG capsule Take 1 capsule (2 mg total) by mouth 4 (four) times daily as needed for diarrhea or loose stools. Patient taking differently: Take 2 mg by mouth daily.  01/18/19  Yes Fawze, Mina A, PA-C  magnesium oxide (MAG-OX) 400 MG tablet Take 800 mg by mouth in the morning, at noon, and at bedtime.    Yes [provider]  metoprolol succinate (TOPROL-XL) 25 MG 24 hr tablet Take 1 tablet (25 mg total) by mouth at bedtime. 12/28/18  Yes Bensimhon, DanShaune PascalD  metoprolol tartrate (LOPRESSOR) 50 MG tablet Take 50 mg by mouth daily.   Yes [provider]  mupirocin ointment (BACTROBAN) 2 % Place 1 application into the nose daily.   Yes [provider]  OLANZapine (ZYPREXA) 7.5 MG tablet Take 1 tablet (7.5 mg total) by mouth at bedtime. 01/03/16  Yes Reed, Tiffany L, DO  omeprazole (PRILOSEC) 20 MG capsule Take 20 mg by mouth daily before breakfast.    Yes [provider]  tamsulosin (FLOMAX) 0.4 MG CAPS capsule Take 0.4 mg by mouth daily.   Yes [provider]  triamcinolone (NASACORT ALLERGY 24HR) 55 MCG/ACT AERO nasal inhaler Place 2 sprays into the nose daily as needed (allergies).   Yes [provider]  vitamin B-12 (CYANOCOBALAMIN) 1000 MCG tablet Take 1,000 mcg by mouth daily.   Yes [provider]  aspirin EC 81 MG tablet Take 1 tablet (81 mg total) by mouth daily. 04/19/19   McLLarey DresserD    Past Medical History: Past Medical History:  Diagnosis Date  . Anemia   . Anxiety   . Benign paroxysmal positional vertigo   . Bipolar I disorder, most recent episode (or current) unspecified   . Celiac disease   . CHF (congestive heart failure) ([facility] . Chronic kidney disease, stage III (moderate)   . Crohn's    Remicade q8 weeks  . Depression   . Essential and other specified forms of tremor    medication-induced Parkinson's, now resolved  . Essential hypertension, benign   . Gout 2018  . Heart failure ([facility] .  Hypertrophy of prostate without urinary obstruction and other lower urinary tract symptoms (LUTS)   . Impotence of organic origin   . Insomnia with sleep apnea, unspecified   . Iron deficiency anemia, unspecified   . 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 Neuralgia, neuritis, and radiculitis, unspecified   . Other B-complex deficiencies   . Other extrapyramidal disease and abnormal movement disorder   . Postinflammatory pulmonary fibrosis ([place])   . Tobacco use disorder   . Vertigo 2018    Past Surgical History: Past Surgical History:  Procedure Laterality Date  . CATARACT EXTRACTION, BILATERAL Bilateral 09/2018  . CHOLECYSTECTOMY  07-12-2010  . RIGHT HEART CATH N/A 10/12/2018   Procedure: RIGHT HEART CATH;  Surgeon: Jolaine Artist, MD;  Location: [place] [facility];  Service: Cardiovascular;  Laterality: N/A;  . SMALL INTESTINE SURGERY     x 2    Family History:  Family History  Problem Relation Age of Onset  . Diabetes Mother        maternal grandmother  . Uterine cancer Mother   . Emphysema Father   . Pneumonia Maternal Grandmother   . Colon cancer Neg Hx     Social History: Social History   Socioeconomic History  . Marital status: Divorced    Spouse name: Not on file  . Number of children: 1  . Years of education: 51  . Highest education level: Not on file  Occupational History  . Occupation: retired  . Occupation: Veteran  Tobacco  Use  . Smoking status: Former Smoker    Packs/day: 1.00    Years: 49.00    Pack years: 49.00    Types: Cigarettes    Start date: 04/11/1956    Quit date: 04/17/2014    Years since quitting: 5.0  . Smokeless tobacco: Never Used  Substance and Sexual Activity  . Alcohol use: No    Alcohol/week: 0.0 standard drinks  . Drug use: No  . Sexual activity: Never  Other Topics Concern  . Not on file  Social History Narrative  . Not on file   Social Determinants of Health   Financial Resource Strain:   . Difficulty of Paying Living Expenses: Not on file  Food Insecurity:   . Worried About Charity fundraiser in the Last Year: Not on file  . Ran Out of Food in the Last Year: Not on file  Transportation Needs:   . Lack of Transportation (Medical): Not on file  . Lack of Transportation (Non-Medical): Not on file  Physical Activity:   . Days of Exercise per Week: Not on file  . Minutes of Exercise per Session: Not on file  Stress:   . Feeling of Stress : Not on file  Social Connections:   . Frequency of Communication with Friends and Family: Not on file  . Frequency of Social Gatherings with Friends and Family: Not on file  . Attends Religious Services: Not on file  . Active Member of Clubs or Organizations: Not on file  . Attends Archivist Meetings: Not on file  . Marital Status: Not on file    Allergies:  Allergies  Allergen Reactions  . Azathioprine Other (See Comments)    REACTION: affected WBC "Almost died"  . Ciprofloxacin Other (See Comments)    Unknown rxn  . Levaquin [Levofloxacin In D5w] Other (See Comments)    Unknown rxn  . Plendil [Felodipine] Other (See Comments)    Unknown rxn    Objective:    Vital  Signs:   Temp:  [97.6 F (36.4 C)-97.7 F (36.5 C)] 97.7 F (36.5 C) (03/01 1227) Pulse Rate:  [68-79] 79 (03/01 1227) Resp:  [17] 17 (03/01 0541) BP: (150-156)/(81-96) 156/96 (03/01 1227) SpO2:  [98 %] 98 % (03/01 1227) Weight:  [78.3 kg-78.9  kg] 78.3 kg (03/01 1227)   Filed Weights   04/25/19 0541 04/25/19 1227  Weight: 78.9 kg 78.3 kg     Physical Exam     General:  Well appearing. No respiratory difficulty HEENT: Normal Neck: Supple. no JVD. Carotids 2+ bilat; no bruits. No lymphadenopathy or thyromegaly appreciated. Cor: PMI nondisplaced. Regular rate & rhythm. No rubs, gallops or murmurs. Lungs: Clear Abdomen: Soft, nontender, nondistended. No hepatosplenomegaly. No bruits or masses. Good bowel sounds. Extremities: No cyanosis, clubbing, rash, edema Neuro: Alert & oriented x 3, cranial nerves grossly intact. moves all 4 extremities w/o difficulty. Affect pleasant.   Telemetry  SR 70-80s   EKG   n/a  Labs     Basic Metabolic Panel: Recent Labs  Lab 04/19/19 1041 04/25/19 0600  NA 144 145  K 3.9 3.8  CL 113* 114*  CO2 23 23  GLUCOSE 101* 84  BUN 29* 27*  CREATININE 2.12* 2.15*  CALCIUM 8.5* 8.3*    Liver Function Tests: No results for input(s): AST, ALT, ALKPHOS, BILITOT, PROT, ALBUMIN in the last 168 hours. No results for input(s): LIPASE, AMYLASE in the last 168 hours. No results for input(s): AMMONIA in the last 168 hours.  CBC: Recent Labs  Lab 04/19/19 1041  WBC 8.6  HGB 11.2*  HCT 36.9*  MCV 107.9*  PLT 149*    Cardiac Enzymes: No results for input(s): CKTOTAL, CKMB, CKMBINDEX, TROPONINI in the last 168 hours.  BNP: BNP (last 3 results) Recent Labs    05/12/18 0629 08/31/18 0134 11/10/18 1504  BNP 1,145.7* 1,257.9* 501.7*    ProBNP (last 3 results) No results for input(s): PROBNP in the last 8760 hours.   CBG: No results for input(s): GLUCAP in the last 168 hours.  Coagulation Studies: No results for input(s): LABPROT, INR in the last 72 hours.  Imaging:  No results found.    Assessment/Plan   1.AKI on CKD Stage III Creatinine on admit 2.1. Admit for hydration prior to diagnostic cath in the morning.  Started on NS IV 75 cc per hour.   2.  Chronic  systolic CHF: Diagnosed by echo in 3/20, EF 30-35%.  Cardiolite showed fixed defects but no ischemia.  Etiology of cardiomyopathy is uncertain, he has not had cath or MRI due to elevated creatinine. He has no chest pain.  Medication titration has been limited by orthostasis/low BP.  Echo in 8/20 showed EF 20-25% with mild LVH and normal RV function.  With resting sinus tachycardia, I was concerned for low output HF, but RHC in 8/20 showed normal cardiac output and filling pressures.  - [place]- Continue current hydralazine/Imdur, no BP room to titrate.  - Continue Toprol XL 25 mg daily, take in evening before bed. - Continue ivabradine 5 mg bid, HR is now controlled.   - Continue Lasix three days/week.  - No ACEI/ARB/ARNI with low BP and elevated creatinine.  - Creatinine has trended lower.  He has never had a cath to diagnose presence of coronary disease as a cause of his cardiomyopathy.  We will plan for RHC/LHC to assess for coronary disease and to assess hemodynamics.  We discussed risks/benefits and he agrees to procedure.  He will take ASA  81 mg daily.  - Will plan addition of spironolactone after cath as this will likely not affect BP much.  - He would be a difficult candidate for advanced therapies with deconditioning, renal dysfunction, and lives alone.   3. COPD: Mild emphysema on prior chest CT.  PFTs in 9/20 showed mild obstruction and mild restriction.  High resolution CT chest 10/20 showed no ILD, +chronic bronchitis.  - Repeat CT in 10/21 to follow small lung nodule given smoking history   Darrick Grinder, NP 04/25/2019, 2:15 PM  Advanced Heart Failure Team Pager [phone] (M-F; 7a - 4p)  Please contact [facility] for night-coverage after hours (4p -7a ) and weekends on [email or web address]  Patient seen with NP, agree with the above note.  BMET today with creatinine 2.1.  We decided to admit him overnight for hydration prior to left/right heart cath tomorrow morning.  Will plan gentle hydration,  NS 75 cc/hr.    General: NAD Neck: No JVD, no thyromegaly or thyroid nodule.  Lungs: Clear to auscultation bilaterally with normal respiratory effort. CV: Nondisplaced PMI.  Heart regular S1/S2, no S3/S4, no murmur.  No peripheral edema.  No carotid bruit.  Normal pedal pulses.  Abdomen: Soft, nontender, no hepatosplenomegaly, no distention.  Skin: Intact without lesions or rashes.  Neurologic: Alert and oriented x 3.  Psych: Normal affect. Extremities: No clubbing or cyanosis.  HEENT: Normal.   Gentle hydration, repeat BMET in am, plan right/left heart cath.   Loralie Champagne 04/25/2019 6:15 PM

## 2019-04-26 ENCOUNTER — Other Ambulatory Visit (HOSPITAL_COMMUNITY): Payer: Self-pay

## 2019-04-26 ENCOUNTER — Telehealth (HOSPITAL_COMMUNITY): Payer: Self-pay | Admitting: *Deleted

## 2019-04-26 ENCOUNTER — Encounter (HOSPITAL_COMMUNITY)
Admission: RE | Disposition: A | Discharge status: Home / Self Care | Payer: Self-pay | Source: Home / Self Care | Attending: Cardiology

## 2019-04-26 DIAGNOSIS — I130 Hypertensive heart and chronic kidney disease with heart failure and stage 1 through stage 4 chronic kidney disease, or unspecified chronic kidney disease: Secondary | ICD-10-CM | POA: Diagnosis not present

## 2019-04-26 DIAGNOSIS — G47419 Narcolepsy without cataplexy: Secondary | ICD-10-CM | POA: Diagnosis not present

## 2019-04-26 DIAGNOSIS — I428 Other cardiomyopathies: Secondary | ICD-10-CM | POA: Diagnosis not present

## 2019-04-26 DIAGNOSIS — J449 Chronic obstructive pulmonary disease, unspecified: Secondary | ICD-10-CM | POA: Diagnosis not present

## 2019-04-26 DIAGNOSIS — M109 Gout, unspecified: Secondary | ICD-10-CM | POA: Diagnosis not present

## 2019-04-26 DIAGNOSIS — Z881 Allergy status to other antibiotic agents status: Secondary | ICD-10-CM | POA: Diagnosis not present

## 2019-04-26 DIAGNOSIS — N183 Chronic kidney disease, stage 3 unspecified: Secondary | ICD-10-CM | POA: Diagnosis not present

## 2019-04-26 DIAGNOSIS — Z79899 Other long term (current) drug therapy: Secondary | ICD-10-CM | POA: Diagnosis not present

## 2019-04-26 DIAGNOSIS — D509 Iron deficiency anemia, unspecified: Secondary | ICD-10-CM | POA: Diagnosis not present

## 2019-04-26 DIAGNOSIS — I429 Cardiomyopathy, unspecified: Secondary | ICD-10-CM | POA: Diagnosis not present

## 2019-04-26 DIAGNOSIS — Z888 Allergy status to other drugs, medicaments and biological substances status: Secondary | ICD-10-CM | POA: Diagnosis not present

## 2019-04-26 DIAGNOSIS — N179 Acute kidney failure, unspecified: Secondary | ICD-10-CM | POA: Diagnosis not present

## 2019-04-26 DIAGNOSIS — I5022 Chronic systolic (congestive) heart failure: Secondary | ICD-10-CM | POA: Diagnosis not present

## 2019-04-26 DIAGNOSIS — K900 Celiac disease: Secondary | ICD-10-CM | POA: Diagnosis not present

## 2019-04-26 DIAGNOSIS — G4700 Insomnia, unspecified: Secondary | ICD-10-CM | POA: Diagnosis not present

## 2019-04-26 DIAGNOSIS — K5090 Crohn's disease, unspecified, without complications: Secondary | ICD-10-CM | POA: Diagnosis not present

## 2019-04-26 DIAGNOSIS — Z825 Family history of asthma and other chronic lower respiratory diseases: Secondary | ICD-10-CM | POA: Diagnosis not present

## 2019-04-26 DIAGNOSIS — Z87891 Personal history of nicotine dependence: Secondary | ICD-10-CM | POA: Diagnosis not present

## 2019-04-26 DIAGNOSIS — Z7982 Long term (current) use of aspirin: Secondary | ICD-10-CM | POA: Diagnosis not present

## 2019-04-26 HISTORY — PX: RIGHT/LEFT HEART CATH AND CORONARY ANGIOGRAPHY: CATH118266

## 2019-04-26 LAB — CBC WITH DIFFERENTIAL/PLATELET
Abs Immature Granulocytes: 0.03 10*3/uL (ref 0.00–0.07)
Basophils Absolute: 0 10*3/uL (ref 0.0–0.1)
Basophils Relative: 1 %
Eosinophils Absolute: 0.3 10*3/uL (ref 0.0–0.5)
Eosinophils Relative: 4 %
HCT: 32.3 % — ABNORMAL LOW (ref 39.0–52.0)
Hemoglobin: 10.2 g/dL — ABNORMAL LOW (ref 13.0–17.0)
Immature Granulocytes: 0 %
Lymphocytes Relative: 38 %
Lymphs Abs: 3 10*3/uL (ref 0.7–4.0)
MCH: 32.9 pg (ref 26.0–34.0)
MCHC: 31.6 g/dL (ref 30.0–36.0)
MCV: 104.2 fL — ABNORMAL HIGH (ref 80.0–100.0)
Monocytes Absolute: 0.7 10*3/uL (ref 0.1–1.0)
Monocytes Relative: 9 %
Neutro Abs: 3.7 10*3/uL (ref 1.7–7.7)
Neutrophils Relative %: 48 %
Platelets: 141 10*3/uL — ABNORMAL LOW (ref 150–400)
RBC: 3.1 MIL/uL — ABNORMAL LOW (ref 4.22–5.81)
RDW: 13.6 % (ref 11.5–15.5)
WBC: 7.8 10*3/uL (ref 4.0–10.5)
nRBC: 0 % (ref 0.0–0.2)

## 2019-04-26 LAB — POCT I-STAT EG7
Acid-base deficit: 2 mmol/L (ref 0.0–2.0)
Acid-base deficit: 3 mmol/L — ABNORMAL HIGH (ref 0.0–2.0)
Bicarbonate: 23.6 mmol/L (ref 20.0–28.0)
Bicarbonate: 24 mmol/L (ref 20.0–28.0)
Calcium, Ion: 1.21 mmol/L (ref 1.15–1.40)
Calcium, Ion: 1.25 mmol/L (ref 1.15–1.40)
HCT: 32 % — ABNORMAL LOW (ref 39.0–52.0)
HCT: 32 % — ABNORMAL LOW (ref 39.0–52.0)
Hemoglobin: 10.9 g/dL — ABNORMAL LOW (ref 13.0–17.0)
Hemoglobin: 10.9 g/dL — ABNORMAL LOW (ref 13.0–17.0)
O2 Saturation: 77 %
O2 Saturation: 78 %
Potassium: 3.2 mmol/L — ABNORMAL LOW (ref 3.5–5.1)
Potassium: 3.3 mmol/L — ABNORMAL LOW (ref 3.5–5.1)
Sodium: 146 mmol/L — ABNORMAL HIGH (ref 135–145)
Sodium: 147 mmol/L — ABNORMAL HIGH (ref 135–145)
TCO2: 25 mmol/L (ref 22–32)
TCO2: 25 mmol/L (ref 22–32)
pCO2, Ven: 45.9 mmHg (ref 44.0–60.0)
pCO2, Ven: 47.6 mmHg (ref 44.0–60.0)
pH, Ven: 7.311 (ref 7.250–7.430)
pH, Ven: 7.319 (ref 7.250–7.430)
pO2, Ven: 46 mmHg — ABNORMAL HIGH (ref 32.0–45.0)
pO2, Ven: 46 mmHg — ABNORMAL HIGH (ref 32.0–45.0)

## 2019-04-26 LAB — BASIC METABOLIC PANEL
Anion gap: 8 (ref 5–15)
BUN: 21 mg/dL (ref 8–23)
CO2: 22 mmol/L (ref 22–32)
Calcium: 8.1 mg/dL — ABNORMAL LOW (ref 8.9–10.3)
Chloride: 114 mmol/L — ABNORMAL HIGH (ref 98–111)
Creatinine, Ser: 1.65 mg/dL — ABNORMAL HIGH (ref 0.61–1.24)
GFR calc Af Amer: 47 mL/min — ABNORMAL LOW (ref >60)
GFR calc non Af Amer: 40 mL/min — ABNORMAL LOW (ref >60)
Glucose, Bld: 82 mg/dL (ref 70–99)
Potassium: 3.5 mmol/L (ref 3.5–5.1)
Sodium: 144 mmol/L (ref 135–145)

## 2019-04-26 SURGERY — RIGHT/LEFT HEART CATH AND CORONARY ANGIOGRAPHY
Anesthesia: LOCAL

## 2019-04-26 MED ORDER — SODIUM CHLORIDE 0.9 % WEIGHT BASED INFUSION: 1.0000 mL/kg/h | INTRAVENOUS | Status: DC

## 2019-04-26 MED ORDER — ACETAMINOPHEN 325 MG PO TABS: 650.0000 mg | ORAL_TABLET | ORAL | Status: DC | PRN

## 2019-04-26 MED ORDER — VERAPAMIL HCL 2.5 MG/ML IV SOLN
INTRAVENOUS | Status: DC | PRN
  Administered 2019-04-26: 10 mL via INTRA_ARTERIAL

## 2019-04-26 MED ORDER — HEPARIN (PORCINE) IN NACL 1000-0.9 UT/500ML-% IV SOLN
INTRAVENOUS | Status: AC
  Filled 2019-04-26: qty 1000

## 2019-04-26 MED ORDER — SODIUM CHLORIDE 0.9% FLUSH: 3.0000 mL | INTRAVENOUS | Status: DC | PRN

## 2019-04-26 MED ORDER — MIDAZOLAM HCL 2 MG/2ML IJ SOLN
INTRAMUSCULAR | Status: AC
  Filled 2019-04-26: qty 2

## 2019-04-26 MED ORDER — MIDAZOLAM HCL 2 MG/2ML IJ SOLN
INTRAMUSCULAR | Status: DC | PRN
  Administered 2019-04-26: 1 mg via INTRAVENOUS

## 2019-04-26 MED ORDER — HYDRALAZINE HCL 20 MG/ML IJ SOLN: 10.0000 mg | INTRAMUSCULAR | Status: DC | PRN

## 2019-04-26 MED ORDER — ONDANSETRON HCL 4 MG/2ML IJ SOLN: 4.0000 mg | Freq: Four times a day (QID) | INTRAMUSCULAR | Status: DC | PRN

## 2019-04-26 MED ORDER — SODIUM CHLORIDE 0.9% FLUSH: 3.0000 mL | Freq: Two times a day (BID) | INTRAVENOUS | Status: DC

## 2019-04-26 MED ORDER — VERAPAMIL HCL 2.5 MG/ML IV SOLN
INTRAVENOUS | Status: AC
  Filled 2019-04-26: qty 2

## 2019-04-26 MED ORDER — LIDOCAINE HCL (PF) 1 % IJ SOLN
INTRAMUSCULAR | Status: AC
  Filled 2019-04-26: qty 30

## 2019-04-26 MED ORDER — SODIUM CHLORIDE 0.9 % IV SOLN: 250.0000 mL | INTRAVENOUS | Status: DC | PRN

## 2019-04-26 MED ORDER — FENTANYL CITRATE (PF) 100 MCG/2ML IJ SOLN
INTRAMUSCULAR | Status: AC
  Filled 2019-04-26: qty 2

## 2019-04-26 MED ORDER — HEPARIN SODIUM (PORCINE) 1000 UNIT/ML IJ SOLN
INTRAMUSCULAR | Status: AC
  Filled 2019-04-26: qty 1

## 2019-04-26 MED ORDER — LIDOCAINE HCL (PF) 1 % IJ SOLN
INTRAMUSCULAR | Status: DC | PRN
  Administered 2019-04-26 (×2): 2 mL

## 2019-04-26 MED ORDER — FENTANYL CITRATE (PF) 100 MCG/2ML IJ SOLN
INTRAMUSCULAR | Status: DC | PRN
  Administered 2019-04-26: 25 ug via INTRAVENOUS

## 2019-04-26 MED ORDER — LABETALOL HCL 5 MG/ML IV SOLN: 10.0000 mg | INTRAVENOUS | Status: DC | PRN

## 2019-04-26 MED ORDER — HEPARIN (PORCINE) IN NACL 1000-0.9 UT/500ML-% IV SOLN
INTRAVENOUS | Status: DC | PRN
  Administered 2019-04-26 (×2): 500 mL

## 2019-04-26 MED ORDER — HEPARIN SODIUM (PORCINE) 1000 UNIT/ML IJ SOLN
INTRAMUSCULAR | Status: DC | PRN
  Administered 2019-04-26: 4000 [IU] via INTRAVENOUS

## 2019-04-26 MED ORDER — IOHEXOL 350 MG/ML SOLN
INTRAVENOUS | Status: DC | PRN
  Administered 2019-04-26: 100 mL

## 2019-04-26 SURGICAL SUPPLY — 12 items
CATH 5FR JL3.5 JR4 ANG PIG MP (CATHETERS) ×1 IMPLANT
CATH BALLN WEDGE 5F 110CM (CATHETERS) ×1 IMPLANT
CATH INFINITI 5 FR 3DRC (CATHETERS) ×1 IMPLANT
CATH INFINITI 5FR AL1 (CATHETERS) ×1 IMPLANT
DEVICE RAD COMP TR BAND LRG (VASCULAR PRODUCTS) ×1 IMPLANT
GLIDESHEATH SLEND SS 6F .021 (SHEATH) ×1 IMPLANT
GUIDEWIRE INQWIRE 1.5J.035X260 (WIRE) IMPLANT
INQWIRE 1.5J .035X260CM (WIRE) ×2
KIT HEART LEFT (KITS) ×2 IMPLANT
PACK CARDIAC CATHETERIZATION (CUSTOM PROCEDURE TRAY) ×2 IMPLANT
SHEATH GLIDE SLENDER 4/5FR (SHEATH) ×1 IMPLANT
TRANSDUCER W/STOPCOCK (MISCELLANEOUS) ×2 IMPLANT

## 2019-04-26 NOTE — Discharge Summary (Signed)
Advanced Heart Failure Team  Discharge Summary   Patient ID: Brent [name] MRN: [id number], DOB/AGE: [birth year] 74 y.o. Admit date: 04/25/2019 D/C date:     04/26/2019   Primary Discharge Diagnoses:  1. AKI on CKD Stage III   2. Chronic Systolic HF  3. COPD   Hospital Course:   Brent [name] [facility] a74 y.o.with a history of Crohn's disease, celiac disease,HTN, COPD,priortobacco use, CKD 3, chronic anemia, and bipolar disorder.Diagnosed with systolic HF in 0/10 (echo with EF 30-35%).   Presented for cath but had elevated creatinine. Creatinine was up to 2.1 so he was given IV fluids. Creatinine on admission was 2.1 but went down to 1.65 with hydration. He had cath the next day and  showed low filling pressures, adequate cardiac output, and no significant coronary disease. Diuretics stopped with no plan to resume.   He will continue to be followed closely in the HF clinic. Of note on med reconciliation he was on Toprol xl, lopressor, and amlodipine. Pharmacy called his pharmacy and he has not been taking amlodipine or lopressor so both were removed.   RHC/LHC 04/25/19 No significant coronary disease RA mean 1 RV 21/2 PA 20/6, mean 12 PCWP mean 6 LV 155/14 AO 155/81 Oxygen saturations: PA 78% AO 99% Cardiac Output (Fick) 9.54  Cardiac Index (Fick) 4.57 Discharge Vitals: Blood pressure (!) 147/86, pulse 72, temperature 97.7 F (36.5 C), temperature source Oral, resp. rate 18, height 6' 4"  (1.93 m), weight 78.6 kg, SpO2 96 %.  Labs: Lab Results  Component Value Date   WBC 7.8 04/26/2019   HGB 10.2 (L) 04/26/2019   HCT 32.3 (L) 04/26/2019   MCV 104.2 (H) 04/26/2019   PLT 141 (L) 04/26/2019    Recent Labs  Lab 04/26/19 0524  NA 144  K 3.5  CL 114*  CO2 22  BUN 21  CREATININE 1.65*  CALCIUM 8.1*  GLUCOSE 82   Lab Results  Component Value Date   CHOL 140 04/19/2019   HDL 54 04/19/2019   LDLCALC 64 04/19/2019   TRIG 110 04/19/2019   BNP (last 3 results) Recent  Labs    05/12/18 0629 08/31/18 0134 11/10/18 1504  BNP 1,145.7* 1,257.9* 501.7*    ProBNP (last 3 results) No results for input(s): PROBNP in the last 8760 hours.   Diagnostic Studies/Procedures   CARDIAC CATHETERIZATION  Result Date: 04/26/2019 1. Normal filling pressures. 2. Preserved cardiac output. 3. No significant coronary disease. Nonischemic cardiomyopathy.  Can stop Lasix.    Discharge Medications   Allergies as of 04/26/2019      Reactions   Azathioprine Other (See Comments)   REACTION: affected WBC "Almost died"   Ciprofloxacin Other (See Comments)   Unknown rxn   Levaquin [levofloxacin In D5w] Other (See Comments)   Unknown rxn   Plendil [felodipine] Other (See Comments)   Unknown rxn      Medication List    STOP taking these medications   amLODipine 10 MG tablet Commonly known as: NORVASC   furosemide 20 MG tablet Commonly known as: LASIX   metoprolol tartrate 50 MG tablet Commonly known as: LOPRESSOR     TAKE these medications   allopurinol 100 MG tablet Commonly known as: ZYLOPRIM Take 2 tablets (200 mg total) by mouth daily.   aspirin EC 81 MG tablet Take 1 tablet (81 mg total) by mouth daily.   Depakote ER 500 MG 24 hr tablet Generic drug: divalproex Take 1,500 mg by mouth at bedtime.   ferrous  sulfate 325 (65 FE) MG tablet Take 325 mg by mouth every Monday, Wednesday, and Friday.   hydrALAZINE 25 MG tablet Commonly known as: APRESOLINE Take 1 tablet (25 mg total) by mouth 2 (two) times daily. What changed: when to take this   inFLIXimab 100 MG injection Commonly known as: Remicade Infuse Remicade IV schedule 1 69m/kg every 8 weeks Premedicate with Tylenol 500-6562mby mouth and Benadryl 25-5040my mouth prior to infusion. Last PPD was on 12/2009.   isosorbide mononitrate 30 MG 24 hr tablet Commonly known as: IMDUR Take 1 tablet (30 mg total) by mouth daily.   ivabradine 5 MG Tabs tablet Commonly known as: CORLANOR Take 1  tablet (5 mg total) by mouth 2 (two) times daily with a meal.   loperamide 2 MG capsule Commonly known as: IMODIUM Take 1 capsule (2 mg total) by mouth 4 (four) times daily as needed for diarrhea or loose stools. What changed: when to take this   magnesium oxide 400 MG tablet Commonly known as: MAG-OX Take 800 mg by mouth in the morning, at noon, and at bedtime.   metoprolol succinate 25 MG 24 hr tablet Commonly known as: TOPROL-XL Take 1 tablet (25 mg total) by mouth at bedtime.   mupirocin ointment 2 % Commonly known as: BACTROBAN Place 1 application into the nose daily.   Nasacort Allergy 24HR 55 MCG/ACT Aero nasal inhaler Generic drug: triamcinolone Place 2 sprays into the nose daily as needed (allergies).   OLANZapine 7.5 MG tablet Commonly known as: ZyPREXA Take 1 tablet (7.5 mg total) by mouth at bedtime.   omeprazole 20 MG capsule Commonly known as: PRILOSEC Take 20 mg by mouth daily before breakfast.   Procrit 10000 UNIT/ML injection Generic drug: epoetin alfa Inject 10,000 Units into the skin every 30 (thirty) days.   tamsulosin 0.4 MG Caps capsule Commonly known as: FLOMAX Take 0.4 mg by mouth daily.   vitamin B-12 1000 MCG tablet Commonly known as: CYANOCOBALAMIN Take 1,000 mcg by mouth daily.   Vitamin D 125 MCG (5000 UT) Caps Take 5,000 Units by mouth daily.       Disposition   The patient will be discharged in stable condition to home. Discharge Instructions    (HEART FAILURE PATIENTS) Call MD:  Anytime you have any of the following symptoms: 1) 3 pound weight gain in 24 hours or 5 pounds in 1 week 2) shortness of breath, with or without a dry hacking cough 3) swelling in the hands, feet or stomach 4) if you have to sleep on extra pillows at night in order to breathe.   Complete by: As directed    Diet - low sodium heart healthy   Complete by: As directed    Heart Failure patients record your daily weight using the same scale at the same time of  day   Complete by: As directed    Increase activity slowly   Complete by: As directed      Follow-up Information    [facility] up on 05/09/2019.   Specialty: Cardiology Why: 08:30  Contact information: [address][id number] [phone]    ReeHollace Kinnier DO. Go on 05/05/2019.   Specialty: Geriatric Medicine Why: @1 :00pm Contact information: [phone]          Duration of Discharge Encounter: Greater than 35 minutes   Signed,  Akoni Parton NP-C  04/26/2019, 10:31 AM

## 2019-04-26 NOTE — Plan of Care (Signed)
  Problem: Safety: Goal: Ability to remain free from injury will improve Outcome: Progressing   

## 2019-04-26 NOTE — Telephone Encounter (Signed)
Reviewed pt cath report from today using right radial approach no insertion site issues.  No intervention warranted.  Already received okay from Dr. [name] for pt to resume exercise at Cardiac rehab on tomorrow with avoidance of arm exercises for a week or so.  Called and spoke to pt to advise him that he may resume. Reminded pt to continue to avoid any heavy lifting with the arm. Advised pt that if his wrist area felt more sore/tender in the morning to wait to return on Friday.  Pt verbalized understanding. Cherre Huger, BSN Cardiac and Training and development officer

## 2019-04-26 NOTE — TOC Progression Note (Addendum)
Transition of Care [facility]) - Progression Note    Patient Details  Name: Brent [name] MRN: [id number] Date of Birth: 12-18-1945  [facility] of [facility]) CM/SW Contact  Zenon Mayo, RN Phone Number: 04/26/2019, 9:44 AM  Clinical Narrative:    Patient is for dc today, NCM asked if he would like [facility] for CHF management, he states  He goes to the HF clinic and he has para medicine following him also so he does not need HHRN.  He states [facility] will be transporting him home today.         Expected Discharge Plan and Services           Expected Discharge Date: 04/26/19                                     Social Determinants of Health (SDOH) Interventions    Readmission Risk Interventions No flowsheet data found.

## 2019-04-26 NOTE — Progress Notes (Signed)
[name] called to have me come out to adjust pill box according to medication changes. I reviewed same and completed 2 pill box according to updates. Will call [name] for follow up next week.

## 2019-04-26 NOTE — Interval H&P Note (Signed)
History and Physical Interval Note:  04/26/2019 7:46 AM  Brent [name]  has presented today for surgery, with the diagnosis of heart failure.  The various methods of treatment have been discussed with the patient and family. After consideration of risks, benefits and other options for treatment, the patient has consented to  Procedure(s): RIGHT/LEFT HEART CATH AND CORONARY ANGIOGRAPHY (N/A) as a surgical intervention.  The patient's history has been reviewed, patient examined, no change in status, stable for surgery.  I have reviewed the patient's chart and labs.  Questions were answered to the patient's satisfaction.     Arias Weinert [employer]

## 2019-04-26 NOTE — Care Management Obs Status (Signed)
MEDICARE OBSERVATION STATUS NOTIFICATION   Patient Details  Name: Brent [name] MRN: [id number] Date of Birth: 07/19/1945   Medicare Observation Status Notification Given:  Yes    Zenon Mayo, RN 04/26/2019, 9:39 AM

## 2019-04-27 ENCOUNTER — Encounter (HOSPITAL_COMMUNITY): Payer: Medicare Other

## 2019-04-27 ENCOUNTER — Telehealth (HOSPITAL_COMMUNITY): Payer: Self-pay | Admitting: Internal Medicine

## 2019-04-27 NOTE — Telephone Encounter (Signed)
Pt phone call returned.  Pt informed that he could return on Monday as requested.  Pt was agreeable to this.  He stated "I just didn't know about coming back to exercise after leaving the hospital yesterday.  Pt appreciative of phone call.

## 2019-04-29 ENCOUNTER — Encounter (HOSPITAL_COMMUNITY): Payer: Medicare Other

## 2019-04-29 DIAGNOSIS — N184 Chronic kidney disease, stage 4 (severe): Secondary | ICD-10-CM | POA: Diagnosis not present

## 2019-04-29 DIAGNOSIS — N189 Chronic kidney disease, unspecified: Secondary | ICD-10-CM | POA: Diagnosis not present

## 2019-04-29 DIAGNOSIS — N2581 Secondary hyperparathyroidism of renal origin: Secondary | ICD-10-CM | POA: Diagnosis not present

## 2019-05-02 ENCOUNTER — Telehealth (HOSPITAL_COMMUNITY): Payer: Self-pay | Admitting: Internal Medicine

## 2019-05-02 ENCOUNTER — Other Ambulatory Visit: Payer: Self-pay

## 2019-05-02 ENCOUNTER — Telehealth (HOSPITAL_COMMUNITY): Payer: Self-pay | Admitting: Cardiology

## 2019-05-02 ENCOUNTER — Encounter (HOSPITAL_COMMUNITY): Payer: Medicare Other

## 2019-05-02 ENCOUNTER — Ambulatory Visit (HOSPITAL_COMMUNITY)
Admission: RE | Admit: 2019-05-02 | Discharge: 2019-05-02 | Disposition: A | Discharge status: Home / Self Care | Payer: Medicare Other | Source: Ambulatory Visit | Attending: Nephrology | Admitting: Nephrology

## 2019-05-02 DIAGNOSIS — I129 Hypertensive chronic kidney disease with stage 1 through stage 4 chronic kidney disease, or unspecified chronic kidney disease: Secondary | ICD-10-CM | POA: Diagnosis not present

## 2019-05-02 DIAGNOSIS — N189 Chronic kidney disease, unspecified: Secondary | ICD-10-CM | POA: Insufficient documentation

## 2019-05-02 DIAGNOSIS — N183 Chronic kidney disease, stage 3 unspecified: Secondary | ICD-10-CM | POA: Diagnosis not present

## 2019-05-02 DIAGNOSIS — N2581 Secondary hyperparathyroidism of renal origin: Secondary | ICD-10-CM | POA: Diagnosis not present

## 2019-05-02 DIAGNOSIS — D631 Anemia in chronic kidney disease: Secondary | ICD-10-CM | POA: Diagnosis not present

## 2019-05-02 LAB — HEMOGLOBIN AND HEMATOCRIT, BLOOD
HCT: 38.1 % — ABNORMAL LOW (ref 39.0–52.0)
Hemoglobin: 11.8 g/dL — ABNORMAL LOW (ref 13.0–17.0)

## 2019-05-02 LAB — IRON AND TIBC
Iron: 71 ug/dL (ref 45–182)
Saturation Ratios: 28 % (ref 17.9–39.5)
TIBC: 249 ug/dL — ABNORMAL LOW (ref 250–450)
UIBC: 178 ug/dL

## 2019-05-02 LAB — FERRITIN: Ferritin: 861 ng/mL — ABNORMAL HIGH (ref 24–336)

## 2019-05-02 MED ORDER — EPOETIN ALFA 10000 UNIT/ML IJ SOLN
10000.0000 [IU] | INTRAMUSCULAR | Status: DC
  Administered 2019-05-02: 10000 [IU] via SUBCUTANEOUS
  Filled 2019-05-02: qty 1

## 2019-05-02 NOTE — Discharge Instructions (Signed)

## 2019-05-02 NOTE — Telephone Encounter (Signed)
Patient called with concerns regarding R arm pain Reports he just had a cath on this arm last week and has had some discomfort from wrist to shoulder. Denies swelling,tenderness, redness Reports he has a history of arthritis and feels pain is more related to arthritis  Advised we could proceeded with getting an ultrasound to rule out clot s/p cath however patient declined at this time.  Advised ok for patient to result cardiac rehab

## 2019-05-02 NOTE — Progress Notes (Signed)
1000 Patient arrived at the [facility] for a epogen injection, ambulatory and A&Ox4. Blood work drawn and sent to lab. Awaiting results.   1130 Results received, vital signs taken and WNL, injection given. RN discharged patient, reviewed paperwork, answered all questions. Pt understands without assistance. Pt ambulatory at time of discharge.

## 2019-05-04 ENCOUNTER — Encounter (HOSPITAL_COMMUNITY)
Admission: RE | Admit: 2019-05-04 | Discharge: 2019-05-04 | Disposition: A | Discharge status: Home / Self Care | Payer: Medicare Other | Source: Ambulatory Visit | Attending: Cardiology | Admitting: Cardiology

## 2019-05-04 ENCOUNTER — Other Ambulatory Visit: Payer: Self-pay

## 2019-05-04 ENCOUNTER — Encounter (HOSPITAL_COMMUNITY): Payer: Medicare Other

## 2019-05-04 DIAGNOSIS — I5022 Chronic systolic (congestive) heart failure: Secondary | ICD-10-CM | POA: Diagnosis not present

## 2019-05-05 ENCOUNTER — Ambulatory Visit: Payer: Medicare Other | Admitting: Internal Medicine

## 2019-05-05 NOTE — Progress Notes (Signed)
Cardiac Individual Treatment Plan  Patient Details  Name: Brent [name] MRN: [id number] Date of Birth: January 20, 1946 Referring Provider:     CARDIAC REHAB PHASE II ORIENTATION from 03/15/2019 in [place]  Referring Provider  Dr Hollace Kinnier, Dr Fransico Him Covering      Initial Encounter Date:    CARDIAC REHAB PHASE II ORIENTATION from 03/15/2019 in [place]  Date  03/15/19      Visit Diagnosis: Heart failure, chronic systolic ([place])  Patient's Home Medications on Admission:  Current Outpatient Medications:  .  allopurinol (ZYLOPRIM) 100 MG tablet, Take 2 tablets (200 mg total) by mouth daily., Disp: 60 tablet, Rfl: 5 .  aspirin EC 81 MG tablet, Take 1 tablet (81 mg total) by mouth daily., Disp: 90 tablet, Rfl: 3 .  Cholecalciferol (VITAMIN D) 125 MCG (5000 UT) CAPS, Take 5,000 Units by mouth daily. , Disp: , Rfl:  .  divalproex (DEPAKOTE ER) 500 MG 24 hr tablet, Take 1,500 mg by mouth at bedtime., Disp: , Rfl:  .  epoetin alfa (PROCRIT) 09983 UNIT/ML injection, Inject 10,000 Units into the skin every 30 (thirty) days. , Disp: , Rfl:  .  ferrous sulfate 325 (65 FE) MG tablet, Take 325 mg by mouth every Monday, Wednesday, and Friday. , Disp: , Rfl:  .  hydrALAZINE (APRESOLINE) 25 MG tablet, Take 1 tablet (25 mg total) by mouth 2 (two) times daily. (Patient taking differently: Take 25 mg by mouth 3 (three) times daily. ), Disp: 60 tablet, Rfl: 3 .  inFLIXimab (REMICADE) 100 MG injection, Infuse Remicade IV schedule 1 72m/kg every 8 weeks Premedicate with Tylenol 500-6522mby mouth and Benadryl 25-5034my mouth prior to infusion. Last PPD was on 12/2009. , Disp: 1 each, Rfl: 6 .  isosorbide mononitrate (IMDUR) 30 MG 24 hr tablet, Take 1 tablet (30 mg total) by mouth daily., Disp: 90 tablet, Rfl: 3 .  ivabradine (CORLANOR) 5 MG TABS tablet, Take 1 tablet (5 mg total) by mouth 2 (two) times daily with a meal., Disp: 180 tablet,  Rfl: 3 .  loperamide (IMODIUM) 2 MG capsule, Take 1 capsule (2 mg total) by mouth 4 (four) times daily as needed for diarrhea or loose stools. (Patient taking differently: Take 2 mg by mouth daily. ), Disp: 12 capsule, Rfl: 0 .  magnesium oxide (MAG-OX) 400 MG tablet, Take 800 mg by mouth in the morning, at noon, and at bedtime. , Disp: , Rfl:  .  metoprolol succinate (TOPROL-XL) 25 MG 24 hr tablet, Take 1 tablet (25 mg total) by mouth at bedtime. (Patient not taking: Reported on 04/26/2019), Disp: 90 tablet, Rfl: 3 .  mupirocin ointment (BACTROBAN) 2 %, Place 1 application into the nose daily., Disp: , Rfl:  .  OLANZapine (ZYPREXA) 7.5 MG tablet, Take 1 tablet (7.5 mg total) by mouth at bedtime., Disp: 30 tablet, Rfl: 11 .  omeprazole (PRILOSEC) 20 MG capsule, Take 20 mg by mouth daily before breakfast. , Disp: , Rfl:  .  tamsulosin (FLOMAX) 0.4 MG CAPS capsule, Take 0.4 mg by mouth daily., Disp: , Rfl:  .  triamcinolone (NASACORT ALLERGY 24HR) 55 MCG/ACT AERO nasal inhaler, Place 2 sprays into the nose daily as needed (allergies)., Disp: , Rfl:  .  vitamin B-12 (CYANOCOBALAMIN) 1000 MCG tablet, Take 1,000 mcg by mouth daily., Disp: , Rfl:   Past Medical History: Past Medical History:  Diagnosis Date  . Anemia   . Anxiety   .  Benign paroxysmal positional vertigo   . Bipolar I disorder, most recent episode (or current) unspecified   . Celiac disease   . CHF (congestive heart failure) ([place])   . Chronic kidney disease, stage III (moderate)   . Crohn's    Remicade q8 weeks  . Depression   . Essential and other specified forms of tremor    medication-induced Parkinson's, now resolved  . Essential hypertension, benign   . Gout 2018  . Heart failure ([place])   . Hypertrophy of prostate without urinary obstruction and other lower urinary tract symptoms (LUTS)   . Impotence of organic origin   . Insomnia with sleep apnea, unspecified   . Iron deficiency anemia, unspecified   . 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 Neuralgia, neuritis, and radiculitis, unspecified   . Other B-complex deficiencies   . Other extrapyramidal disease and abnormal movement disorder   . Postinflammatory pulmonary fibrosis ([place])   . Tobacco use disorder   . Vertigo 2018    Tobacco Use: Social History   Tobacco Use  Smoking Status Former Smoker  . Packs/day: 1.00  . Years: 49.00  . Pack years: 49.00  . Types: Cigarettes  . Start date: 04/11/1956  . Quit date: 04/17/2014  . Years since quitting: 5.0  Smokeless Tobacco Never Used    Labs: Recent Review Flowsheet Data    Labs for ITP Cardiac and Pulmonary Rehab Latest Ref Rng & Units 10/12/2018 10/12/2018 04/19/2019 04/26/2019 04/26/2019   Cholestrol 0 - 200 mg/dL - - 140 - -   LDLCALC 0 - 99 mg/dL - - 64 - -   HDL >40 mg/dL - - 54 - -   Trlycerides <150 mg/dL - - 110 - -   Hemoglobin A1c 4.8 - 5.6 % - - - - -   HCO3 20.0 - 28.0 mmol/L 15.7(L) 18.3(L) - 23.6 24.0   TCO2 22 - 32 mmol/L 17(L) 19(L) - 25 25   ACIDBASEDEF 0.0 - 2.0 mmol/L 9.0(H) 7.0(H) - 3.0(H) 2.0   O2SAT % 69.0 69.0 - 78.0 77.0      Capillary Blood Glucose: Lab Results  Component Value Date   GLUCAP 109 (H) 01/18/2019   GLUCAP 98 09/12/2018   GLUCAP 94 09/11/2018   GLUCAP 89 09/10/2018   GLUCAP 90 09/09/2018     Exercise Target Goals: Exercise Program Goal: Individual exercise prescription set using results from initial 6 min walk test and THRR while considering  patient's activity barriers and safety.   Exercise Prescription Goal: Initial exercise prescription builds to 30-45 minutes a day of aerobic activity, 2-3 days per week.  Home exercise guidelines will be given to patient during program as part of exercise prescription that the participant will acknowledge.  Activity Barriers & Risk Stratification: Activity Barriers & Cardiac Risk Stratification - 03/15/19 1349      Activity Barriers & Cardiac Risk Stratification   Activity Barriers  Joint Problems;Balance Concerns     Cardiac Risk Stratification  High       6 Minute Walk: 6 Minute Walk    Row Name 03/15/19 1406         6 Minute Walk   Phase  Initial     Distance  1188 feet     Walk Time  6 minutes     # of Rest Breaks  0     MPH  2.25     METS  2.79     RPE  9     Perceived Dyspnea  0     VO2 Peak  9.75     Symptoms  No     Resting HR  75 bpm     Resting BP  110/70     Resting Oxygen Saturation   97 %     Exercise Oxygen Saturation  during 6 min walk  96 %     Max Ex. HR  88 bpm     Max Ex. BP  116/68     2 Minute Post BP  118/72        Oxygen Initial Assessment:   Oxygen Re-Evaluation:   Oxygen Discharge (Final Oxygen Re-Evaluation):   Initial Exercise Prescription: Initial Exercise Prescription - 03/16/19 0900      Date of Initial Exercise RX and Referring Provider   Date  03/15/19    Referring Provider  Dr Hollace Kinnier, Dr Fransico Him Covering      Track   Minutes  30   10 minutes, 3 times/day     Prescription Details   Frequency (times per week)  2-3    Duration  Progress to 30 minutes of continuous aerobic without signs/symptoms of physical distress      Intensity   THRR 40-80% of Max Heartrate  59-118    Ratings of Perceived Exertion  11-13    Perceived Dyspnea  0-4      Progression   Progression  Continue to progress workloads to maintain intensity without signs/symptoms of physical distress.      Resistance Training   Training Prescription  Yes    Reps  10-15       Perform Capillary Blood Glucose checks as needed.  Exercise Prescription Changes: Exercise Prescription Changes    Row Name 04/13/19 1400 04/18/19 1315 05/04/19 1315         Response to Exercise   Blood Pressure (Admit)  122/66  100/62  130/70     Blood Pressure (Exercise)  144/80  130/72  110/68     Blood Pressure (Exit)  128/70  120/74  104/62     Heart Rate (Admit)  94 bpm  88 bpm  95 bpm     Heart Rate (Exercise)  95 bpm  86 bpm  91 bpm     Heart Rate (Exit)  88 bpm  84 bpm   89 bpm     Rating of Perceived Exertion (Exercise)  13  15  13      Symptoms  None  None  Arthritic pain.      Comments  Pt first day of exercise.   --  --     Duration  Continue with 30 min of aerobic exercise without signs/symptoms of physical distress.  Continue with 30 min of aerobic exercise without signs/symptoms of physical distress.  Continue with 30 min of aerobic exercise without signs/symptoms of physical distress.     Intensity  THRR unchanged  THRR unchanged  THRR unchanged       Progression   Progression  Continue to progress workloads to maintain intensity without signs/symptoms of physical distress.  Continue to progress workloads to maintain intensity without signs/symptoms of physical distress.  Continue to progress workloads to maintain intensity without signs/symptoms of physical distress.     Average METs  1.3  1.1  1.3       Resistance Training   Training Prescription  No  Yes  No     Weight  --  3 lbs.   --     Reps  --  10-15  --     Time  --  10 Minutes  --       Interval Training   Interval Training  No  No  No       NuStep   Level  2  2  2      SPM  65  65  25     Minutes  15  15  30      METs  1.3  1.2  1.3       Arm Ergometer   Level  2  2  --     Minutes  15  15  --     METs  1  1  --        Exercise Comments: Exercise Comments    Row Name 03/15/19 1349 04/04/19 1509 04/13/19 1400 05/05/19 1412     Exercise Comments  Reviewed home exercise plan with patient.  Patient will be contacted to begin participation in the onsite cardiac rehab program after temporary closure due to COVID-19 pandemic.  Pt first day of exercise. Pt tolerated exercise well.  Pt is tolerating exercise Rx fair. Pt METs are not increasing.       Exercise Goals and Review: Exercise Goals    Row Name 03/15/19 1343             Exercise Goals   Increase Physical Activity  Yes       Intervention  Provide advice, education, support and counseling about physical  activity/exercise needs.;Develop an individualized exercise prescription for aerobic and resistive training based on initial evaluation findings, risk stratification, comorbidities and participant's personal goals.       Expected Outcomes  Short Term: Attend rehab on a regular basis to increase amount of physical activity.;Long Term: Exercising regularly at least 3-5 days a week.;Long Term: Add in home exercise to make exercise part of routine and to increase amount of physical activity.       Increase Strength and Stamina  Yes       Intervention  Provide advice, education, support and counseling about physical activity/exercise needs.;Develop an individualized exercise prescription for aerobic and resistive training based on initial evaluation findings, risk stratification, comorbidities and participant's personal goals.       Expected Outcomes  Short Term: Increase workloads from initial exercise prescription for resistance, speed, and METs.;Short Term: Perform resistance training exercises routinely during rehab and add in resistance training at home;Long Term: Improve cardiorespiratory fitness, muscular endurance and strength as measured by increased METs and functional capacity (6MWT)       Able to understand and use rate of perceived exertion (RPE) scale  Yes       Intervention  Provide education and explanation on how to use RPE scale       Expected Outcomes  Short Term: Able to use RPE daily in rehab to express subjective intensity level;Long Term:  Able to use RPE to guide intensity level when exercising independently       Knowledge and understanding of Target Heart Rate Range (THRR)  Yes       Intervention  Provide education and explanation of THRR including how the numbers were predicted and where they are located for reference       Expected Outcomes  Short Term: Able to state/look up THRR;Long Term: Able to use THRR to govern intensity when exercising independently;Short Term: Able to use  daily as guideline for intensity in rehab       Able to  check pulse independently  --       Intervention  --       Expected Outcomes  --       Understanding of Exercise Prescription  Yes       Intervention  Provide education, explanation, and written materials on patient's individual exercise prescription       Expected Outcomes  Short Term: Able to explain program exercise prescription;Long Term: Able to explain home exercise prescription to exercise independently          Exercise Goals Re-Evaluation : Exercise Goals Re-Evaluation    Row Name 04/04/19 1507 04/13/19 1400 05/05/19 1409         Exercise Goal Re-Evaluation   Exercise Goals Review  --  Increase Strength and Stamina;Increase Physical Activity;Able to understand and use rate of perceived exertion (RPE) scale;Knowledge and understanding of Target Heart Rate Range (THRR);Able to check pulse independently;Understanding of Exercise Prescription  Increase Physical Activity;Able to understand and use rate of perceived exertion (RPE) scale     Comments  Patient is participating in the virtual cardiac rehab program via weekly telephone call and making progress. Patient is trying to walk daily and rates the activity as "hard".  Pt first day of exercise in [facility] program. Pt tolerated exercise well. Pt understands THRR, RPE scale, and exercise Rx.  Pt is tolerating exercise fair but is having arthritic pain while exercising. Pt METs are 1.3. Exercise Rx was changed due to Pt not tolerating the arm crank well. Pt stated the arm crank was too hard for him. Pt also stated that the seated stepper was hard as well. Encouraged Pt to do the best he could.     Expected Outcomes  Patient will transition to the onsite cardiac rehab program if interested.  Will continue to monitor and progress Pt as tolerated.  Will continue to monitor and progress Pt as tolerated.        Discharge Exercise Prescription (Final Exercise Prescription Changes): Exercise  Prescription Changes - 05/04/19 1315      Response to Exercise   Blood Pressure (Admit)  130/70    Blood Pressure (Exercise)  110/68    Blood Pressure (Exit)  104/62    Heart Rate (Admit)  95 bpm    Heart Rate (Exercise)  91 bpm    Heart Rate (Exit)  89 bpm    Rating of Perceived Exertion (Exercise)  13    Symptoms  Arthritic pain.     Duration  Continue with 30 min of aerobic exercise without signs/symptoms of physical distress.    Intensity  THRR unchanged      Progression   Progression  Continue to progress workloads to maintain intensity without signs/symptoms of physical distress.    Average METs  1.3      Resistance Training   Training Prescription  No      Interval Training   Interval Training  No      NuStep   Level  2    SPM  25    Minutes  30    METs  1.3       Nutrition:  Target Goals: Understanding of nutrition guidelines, daily intake of sodium <1527m, cholesterol <2053m calories 30% from fat and 7% or less from saturated fats, daily to have 5 or more servings of fruits and vegetables.  Biometrics: Pre Biometrics - 03/15/19 1406      Pre Biometrics   Height  6' 1.75" (1.873 m)    Weight  78.7 kg    Waist Circumference  34.5 inches    Hip Circumference  38.5 inches    Waist to Hip Ratio  0.9 %    BMI (Calculated)  22.43    Triceps Skinfold  7 mm    % Body Fat  19.7 %    Grip Strength  21.5 kg    Single Leg Stand  2.25 seconds        Nutrition Therapy Plan and Nutrition Goals: Nutrition Therapy & Goals - 03/22/19 1449      Nutrition Therapy   Diet  Low Sodium      Personal Nutrition Goals   Nutrition Goal  Pt to limit sodium to <2000 mg/day by limiting food items with >300 mg sodium      Intervention Plan   Intervention  Prescribe, educate and counsel regarding individualized specific dietary modifications aiming towards targeted core components such as weight, hypertension, lipid management, diabetes, heart failure and other  comorbidities.;Nutrition handout(s) given to patient.    Expected Outcomes  Long Term Goal: Adherence to prescribed nutrition plan.;Short Term Goal: A plan has been developed with personal nutrition goals set during dietitian appointment.       Nutrition Assessments: Nutrition Assessments - 03/22/19 1446      MEDFICTS Scores   Pre Score  37       Nutrition Goals Re-Evaluation: Nutrition Goals Re-Evaluation    Row Name 03/22/19 1449             Goals   Current Weight  173 lb (78.5 kg)          Nutrition Goals Re-Evaluation: Nutrition Goals Re-Evaluation    Brent [name] Name 03/22/19 1449             Goals   Current Weight  173 lb (78.5 kg)          Nutrition Goals Discharge (Final Nutrition Goals Re-Evaluation): Nutrition Goals Re-Evaluation - 03/22/19 1449      Goals   Current Weight  173 lb (78.5 kg)       Psychosocial: Target Goals: Acknowledge presence or absence of significant depression and/or stress, maximize coping skills, provide positive support system. Participant is able to verbalize types and ability to use techniques and skills needed for reducing stress and depression.  Initial Review & Psychosocial Screening: Initial Psych Review & Screening - 03/15/19 1405      Initial Review   Current issues with  History of Depression;Current Stress Concerns    Source of Stress Concerns  Chronic Illness;Financial      Family Dynamics   Good Support System?  Yes   Brent [name] lives alone but has a sister who lives near by and a son     Barriers   Psychosocial barriers to participate in program  The patient should benefit from training in stress management and relaxation.      Screening Interventions   Interventions  Encouraged to exercise    Expected Outcomes  Short Term goal: Utilizing psychosocial counselor, staff and physician to assist with identification of specific Stressors or current issues interfering with healing process. Setting desired goal for each  stressor or current issue identified.;Long Term Goal: Stressors or current issues are controlled or eliminated.;Short Term goal: Identification and review with participant of any Quality of Life or Depression concerns found by scoring the questionnaire.;Long Term goal: The participant improves quality of Life and PHQ9 Scores as seen by post scores and/or verbalization of changes  Quality of Life Scores: Quality of Life - 03/16/19 0937      Quality of Life   Select  Quality of Life      Quality of Life Scores   Health/Function Pre  20.87 %    Socioeconomic Pre  30 %    Psych/Spiritual Pre  29.14 %    Family Pre  26.5 %    GLOBAL Pre  25.28 %      Scores of 19 and below usually indicate a poorer quality of life in these areas.  A difference of  2-3 points is a clinically meaningful difference.  A difference of 2-3 points in the total score of the Quality of Life Index has been associated with significant improvement in overall quality of life, self-image, physical symptoms, and general health in studies assessing change in quality of life.  PHQ-9: Recent Review Flowsheet Data    Depression screen [facility] 2/9 03/25/2019 03/16/2019 01/19/2019 12/13/2018 09/03/2018   Decreased Interest 0 0 0 0 0   Down, Depressed, Hopeless 0 0 0 0 0   PHQ - 2 Score 0 0 0 0 0     Interpretation of Total Score  Total Score Depression Severity:  1-4 = Minimal depression, 5-9 = Mild depression, 10-14 = Moderate depression, 15-19 = Moderately severe depression, 20-27 = Severe depression   Psychosocial Evaluation and Intervention: Psychosocial Evaluation - 04/13/19 1723      Psychosocial Evaluation & Interventions   Interventions  Stress management education;Relaxation education;Encouraged to exercise with the program and follow exercise prescription    Comments  Brent [name] reports some concerns with depression but feels that it is improving.  He is getting set up with a counselor with the [place].    Expected  Outcomes  Janiel will report management of his depression.    Continue Psychosocial Services   No Follow up required       Psychosocial Re-Evaluation: Psychosocial Re-Evaluation    [place] Name 05/04/19 1524             Psychosocial Re-Evaluation   Current issues with  History of Depression;Current Stress Concerns       Comments  Brent [name] does not report any psychosocial needs at this time.       Expected Outcomes  Brent [name] will continnue to work with a Social worker at the [place] to manage his depression.       Interventions  Encouraged to attend Cardiac Rehabilitation for the exercise       Continue Psychosocial Services   Follow up required by counselor          Psychosocial Discharge (Final Psychosocial Re-Evaluation): Psychosocial Re-Evaluation - 05/04/19 1524      Psychosocial Re-Evaluation   Current issues with  History of Depression;Current Stress Concerns    Comments  Brent [name] does not report any psychosocial needs at this time.    Expected Outcomes  Kwali will continnue to work with a Social worker at the [place] to manage his depression.    Interventions  Encouraged to attend Cardiac Rehabilitation for the exercise    Continue Psychosocial Services   Follow up required by counselor       Vocational Rehabilitation: Provide vocational rehab assistance to qualifying candidates.   Vocational Rehab Evaluation & Intervention: Vocational Rehab - 03/16/19 1127      Initial Vocational Rehab Evaluation & Intervention   Assessment shows need for Vocational Rehabilitation  No       Education: Education Goals: Education classes will be provided on a  weekly basis, covering required topics. Participant will state understanding/return demonstration of topics presented.  Learning Barriers/Preferences: Learning Barriers/Preferences - 03/16/19 1126      Learning Barriers/Preferences   Learning Barriers  Sight   wears glasses   Learning Preferences  Individual Instruction;Skilled Demonstration        Education Topics: Count Your Pulse:  -Group instruction provided by verbal instruction, demonstration, patient participation and written materials to support subject.  Instructors address importance of being able to find your pulse and how to count your pulse when at home without a heart monitor.  Patients get hands on experience counting their pulse with staff help and individually.   Heart Attack, Angina, and Risk Factor Modification:  -Group instruction provided by verbal instruction, video, and written materials to support subject.  Instructors address signs and symptoms of angina and heart attacks.    Also discuss risk factors for heart disease and how to make changes to improve heart health risk factors.   Functional Fitness:  -Group instruction provided by verbal instruction, demonstration, patient participation, and written materials to support subject.  Instructors address safety measures for doing things around the house.  Discuss how to get up and down off the floor, how to pick things up properly, how to safely get out of a chair without assistance, and balance training.   Meditation and Mindfulness:  -Group instruction provided by verbal instruction, patient participation, and written materials to support subject.  Instructor addresses importance of mindfulness and meditation practice to help reduce stress and improve awareness.  Instructor also leads participants through a meditation exercise.    Stretching for Flexibility and Mobility:  -Group instruction provided by verbal instruction, patient participation, and written materials to support subject.  Instructors lead participants through series of stretches that are designed to increase flexibility thus improving mobility.  These stretches are additional exercise for major muscle groups that are typically performed during regular warm up and cool down.   Hands Only CPR:  -Group verbal, video, and participation provides a  basic overview of AHA guidelines for community CPR. Role-play of emergencies allow participants the opportunity to practice calling for help and chest compression technique with discussion of AED use.   Hypertension: -Group verbal and written instruction that provides a basic overview of hypertension including the most recent diagnostic guidelines, risk factor reduction with self-care instructions and medication management.    Nutrition I class: Heart Healthy Eating:  -Group instruction provided by PowerPoint slides, verbal discussion, and written materials to support subject matter. The instructor gives an explanation and review of the Therapeutic Lifestyle Changes diet recommendations, which includes a discussion on lipid goals, dietary fat, sodium, fiber, plant stanol/sterol esters, sugar, and the components of a well-balanced, healthy diet.   Nutrition II class: Lifestyle Skills:  -Group instruction provided by PowerPoint slides, verbal discussion, and written materials to support subject matter. The instructor gives an explanation and review of label reading, grocery shopping for heart health, heart healthy recipe modifications, and ways to make healthier choices when eating out.   Diabetes Question & Answer:  -Group instruction provided by PowerPoint slides, verbal discussion, and written materials to support subject matter. The instructor gives an explanation and review of diabetes co-morbidities, pre- and post-prandial blood glucose goals, pre-exercise blood glucose goals, signs, symptoms, and treatment of hypoglycemia and hyperglycemia, and foot care basics.   Diabetes Blitz:  -Group instruction provided by PowerPoint slides, verbal discussion, and written materials to support subject matter. The instructor gives an explanation and  review of the physiology behind type 1 and type 2 diabetes, diabetes medications and rational behind using different medications, pre- and post-prandial  blood glucose recommendations and Hemoglobin A1c goals, diabetes diet, and exercise including blood glucose guidelines for exercising safely.    Portion Distortion:  -Group instruction provided by PowerPoint slides, verbal discussion, written materials, and food models to support subject matter. The instructor gives an explanation of serving size versus portion size, changes in portions sizes over the last 20 years, and what consists of a serving from each food group.   Stress Management:  -Group instruction provided by verbal instruction, video, and written materials to support subject matter.  Instructors review role of stress in heart disease and how to cope with stress positively.     Exercising on Your Own:  -Group instruction provided by verbal instruction, power point, and written materials to support subject.  Instructors discuss benefits of exercise, components of exercise, frequency and intensity of exercise, and end points for exercise.  Also discuss use of nitroglycerin and activating EMS.  Review options of places to exercise outside of rehab.  Review guidelines for sex with heart disease.   Cardiac Drugs I:  -Group instruction provided by verbal instruction and written materials to support subject.  Instructor reviews cardiac drug classes: antiplatelets, anticoagulants, beta blockers, and statins.  Instructor discusses reasons, side effects, and lifestyle considerations for each drug class.   Cardiac Drugs II:  -Group instruction provided by verbal instruction and written materials to support subject.  Instructor reviews cardiac drug classes: angiotensin converting enzyme inhibitors (ACE-I), angiotensin II receptor blockers (ARBs), nitrates, and calcium channel blockers.  Instructor discusses reasons, side effects, and lifestyle considerations for each drug class.   Anatomy and Physiology of the Circulatory System:  Group verbal and written instruction and models provide basic  cardiac anatomy and physiology, with the coronary electrical and arterial systems. Review of: AMI, Angina, Valve disease, Heart Failure, Peripheral Artery Disease, Cardiac Arrhythmia, Pacemakers, and the ICD.   Other Education:  -Group or individual verbal, written, or video instructions that support the educational goals of the cardiac rehab program.   Holiday Eating Survival Tips:  -Group instruction provided by PowerPoint slides, verbal discussion, and written materials to support subject matter. The instructor gives patients tips, tricks, and techniques to help them not only survive but enjoy the holidays despite the onslaught of food that accompanies the holidays.   Knowledge Questionnaire Score: Knowledge Questionnaire Score - 03/15/19 1550      Knowledge Questionnaire Score   Pre Score  15/24       Core Components/Risk Factors/Patient Goals at Admission: Personal Goals and Risk Factors at Admission - 03/16/19 1127      Core Components/Risk Factors/Patient Goals on Admission    Weight Management  Weight Maintenance;Yes    Expected Outcomes  Weight Maintenance: Understanding of the daily nutrition guidelines, which includes 25-35% calories from fat, 7% or less cal from saturated fats, less than 232m cholesterol, less than 1.5gm of sodium, & 5 or more servings of fruits and vegetables daily;Understanding recommendations for meals to include 15-35% energy as protein, 25-35% energy from fat, 35-60% energy from carbohydrates, less than 2027mof dietary cholesterol, 20-35 gm of total fiber daily;Understanding of distribution of calorie intake throughout the day with the consumption of 4-5 meals/snacks    Heart Failure  Yes    Intervention  Provide a combined exercise and nutrition program that is supplemented with education, support and counseling about heart failure. Directed toward  relieving symptoms such as shortness of breath, decreased exercise tolerance, and extremity edema.     Expected Outcomes  Improve functional capacity of life;Short term: Attendance in program 2-3 days a week with increased exercise capacity. Reported lower sodium intake. Reported increased fruit and vegetable intake. Reports medication compliance.;Short term: Daily weights obtained and reported for increase. Utilizing diuretic protocols set by physician.;Long term: Adoption of self-care skills and reduction of barriers for early signs and symptoms recognition and intervention leading to self-care maintenance.    Hypertension  Yes    Intervention  Provide education on lifestyle modifcations including regular physical activity/exercise, weight management, moderate sodium restriction and increased consumption of fresh fruit, vegetables, and low fat dairy, alcohol moderation, and smoking cessation.;Monitor prescription use compliance.    Expected Outcomes  Short Term: Continued assessment and intervention until BP is < 140/78m HG in hypertensive participants. < 130/823mHG in hypertensive participants with diabetes, heart failure or chronic kidney disease.;Long Term: Maintenance of blood pressure at goal levels.       Core Components/Risk Factors/Patient Goals Review:  Goals and Risk Factor Review    Row Name 04/05/19 0900 04/13/19 1727 05/04/19 1523         Core Components/Risk Factors/Patient Goals Review   Personal Goals Review  Weight Management/Obesity;Heart Failure  Weight Management/Obesity;Heart Failure;Hypertension  Weight Management/Obesity;Heart Failure;Hypertension     Review  Patient with CAD risk factors. Followed by para-medicine program and [employer] Heart Failure Care Management. Patient continues to engage with CR RN/EP/RD via telephone encounters.  Patient with CAD risk factors.  JaZymiretarted exercise today and tolerated it well.  Patient with CAD risk factors.  Brent [name] tolerates exercise fairly.  He has been absent from CR sessions for other medical issues.  On his return to exercise  today, he report issues with arthritis in his knees and shoulders.  Average METs on the stepper were only 1.3.     Expected Outcomes  Patient will continue to engage with CR staff and follow his CR plan of care.  JaKainill continue to participate in CR exercise to reduce his overall CV risk.  JaDyshaunill continue to participate in CR exercise to reduce his overall CV risk.        Core Components/Risk Factors/Patient Goals at Discharge (Final Review):  Goals and Risk Factor Review - 05/04/19 1523      Core Components/Risk Factors/Patient Goals Review   Personal Goals Review  Weight Management/Obesity;Heart Failure;Hypertension    Review  Patient with CAD risk factors.  Avan tolerates exercise fairly.  He has been absent from CR sessions for other medical issues.  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  Average METs on the stepper were only 1.3.    Expected Outcomes  JaAdrienill continue to participate in CR exercise to reduce his overall CV risk.       ITP Comments: ITP Comments    Row Name 03/15/19 1403 04/05/19 0857 04/13/19 1722 05/04/19 1521     ITP Comments  Dr TrFransico HimMD Medical Director  30 day ITP review: In person cardiac rehab exercise sessions resuming. Patient has been contacted however is unsure if he would like to return or continue to have follow up via telephone encounters from CR staff to ensure he is following his exercise prescription. Will follow up within 1 week.  Pt started exercise today and tolerated it well.  30 Day ITP Review.  Delmar tolerates exercise  fairly.  He has been absent for other medical issues.  On his return to exercise today, he reported issues with arthritis in his knees and shoulders.  Average METs on the stepper were only 1.3.       Comments: See ITP Comments.

## 2019-05-06 ENCOUNTER — Encounter (HOSPITAL_COMMUNITY)
Admission: RE | Admit: 2019-05-06 | Discharge: 2019-05-06 | Disposition: A | Discharge status: Home / Self Care | Payer: Medicare Other | Source: Ambulatory Visit | Attending: Cardiology | Admitting: Cardiology

## 2019-05-06 ENCOUNTER — Encounter: Payer: Self-pay | Admitting: Internal Medicine

## 2019-05-06 ENCOUNTER — Other Ambulatory Visit: Payer: Self-pay

## 2019-05-06 ENCOUNTER — Encounter (HOSPITAL_COMMUNITY): Payer: Medicare Other

## 2019-05-06 ENCOUNTER — Ambulatory Visit (INDEPENDENT_AMBULATORY_CARE_PROVIDER_SITE_OTHER): Payer: Medicare Other | Admitting: Internal Medicine

## 2019-05-06 VITALS — BP 132/82 | HR 70 | Temp 97.5°F | Ht 76.0 in | Wt 178.0 lb

## 2019-05-06 DIAGNOSIS — R531 Weakness: Secondary | ICD-10-CM

## 2019-05-06 DIAGNOSIS — I5042 Chronic combined systolic (congestive) and diastolic (congestive) heart failure: Secondary | ICD-10-CM | POA: Diagnosis not present

## 2019-05-06 DIAGNOSIS — M25511 Pain in right shoulder: Secondary | ICD-10-CM

## 2019-05-06 DIAGNOSIS — M1712 Unilateral primary osteoarthritis, left knee: Secondary | ICD-10-CM

## 2019-05-06 DIAGNOSIS — R269 Unspecified abnormalities of gait and mobility: Secondary | ICD-10-CM | POA: Diagnosis not present

## 2019-05-06 DIAGNOSIS — I5022 Chronic systolic (congestive) heart failure: Secondary | ICD-10-CM

## 2019-05-06 DIAGNOSIS — F319 Bipolar disorder, unspecified: Secondary | ICD-10-CM

## 2019-05-06 DIAGNOSIS — M25512 Pain in left shoulder: Secondary | ICD-10-CM

## 2019-05-06 DIAGNOSIS — G8929 Other chronic pain: Secondary | ICD-10-CM

## 2019-05-06 NOTE — Patient Instructions (Addendum)
Try using the voltaren gel you got from the [place] for your left knee and your shoulder pain.  You may apply the gel up to 4 times per day for the pain.  If you continue having pain and the voltaren and tylenol are not helping enough, let me know and I can put a steroid shot in your left knee.    I think the shoulders should get better as you do your cardiac rehab.  Keep me posted.

## 2019-05-06 NOTE — Progress Notes (Signed)
Location:  [facility] clinic Provider: Fergus Throne L. Mariea Clonts, D.O., C.M.D.  Goals of Care:  Advanced Directives 05/06/2019  Does Patient Have a Medical Advance Directive? Yes  Type of Advance Directive Out of facility DNR (pink MOST or yellow form)  Does patient want to make changes to medical advance directive? No - Patient declined  Copy of [place] in Chart? -  Would patient like information on creating a medical advance directive? -  Pre-existing out of facility DNR order (yellow form or pink MOST form) -   Chief Complaint  Patient presents with  . Transitions Of Care    discharged on 3/02 follow up on chest pains . wants to discuss the pain he is having     HPI: Patient is a 74 y.o. male seen today for hospital follow-up s/p admission from 3/1-3/2 with chest pain at [facility].    He had acute on chronic kidney disease 3  He was admitted for hydration and cath with NS at 75cc/hr.  His EF 3/20 was 30-35% on his echo.  His med titration has been limited by his low bps and orthostatic hypotension.  He's being followed in the advanced heart failure clinic and participating in cardiac rehab.  He underwent cath with no significant coronary disease.  He was taken off his diuretics.  He follows up with cardiology 05/09/19.  His emphysema on prior CT and nodule that was noted and needs f/u in 1021 are mentioned.  He's been using tylenol for his shoulder and knees.  Now he knows what his Joselyn Arrow was saying about arthur.  It hurts to get up and reach up.  He does have some topical voltaren gel to try at home but has not used it.    Past Medical History:  Diagnosis Date  . Anemia   . Anxiety   . Benign paroxysmal positional vertigo   . Bipolar I disorder, most recent episode (or current) unspecified   . Celiac disease   . CHF (congestive heart failure) ([place])   . Chronic kidney disease, stage III (moderate)   . Crohn's    Remicade q8 weeks  . Depression   . Essential and other specified  forms of tremor    medication-induced Parkinson's, now resolved  . Essential hypertension, benign   . Gout 2018  . Heart failure ([place])   . Hypertrophy of prostate without urinary obstruction and other lower urinary tract symptoms (LUTS)   . Impotence of organic origin   . Insomnia with sleep apnea, unspecified   . Iron deficiency anemia, unspecified   . 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 Neuralgia, neuritis, and radiculitis, unspecified   . Other B-complex deficiencies   . Other extrapyramidal disease and abnormal movement disorder   . Postinflammatory pulmonary fibrosis ([place])   . Tobacco use disorder   . Vertigo 2018    Past Surgical History:  Procedure Laterality Date  . CATARACT EXTRACTION, BILATERAL Bilateral 09/2018  . CHOLECYSTECTOMY  07-12-2010  . RIGHT HEART CATH N/A 10/12/2018   Procedure: RIGHT HEART CATH;  Surgeon: Jolaine Artist, MD;  Location: [place] [facility];  Service: Cardiovascular;  Laterality: N/A;  . RIGHT/LEFT HEART CATH AND CORONARY ANGIOGRAPHY N/A 04/26/2019   Procedure: RIGHT/LEFT HEART CATH AND CORONARY ANGIOGRAPHY;  Surgeon: Larey Dresser, MD;  Location: [place] [facility];  Service: Cardiovascular;  Laterality: N/A;  . SMALL INTESTINE SURGERY     x 2    Allergies  Allergen Reactions  . Azathioprine Other (See  Comments)    REACTION: affected WBC "Almost died"  . Ciprofloxacin Other (See Comments)    Unknown rxn  . Levaquin [Levofloxacin In D5w] Other (See Comments)    Unknown rxn  . Plendil [Felodipine] Other (See Comments)    Unknown rxn    Outpatient Encounter Medications as of 05/06/2019  Medication Sig  . allopurinol (ZYLOPRIM) 100 MG tablet Take 2 tablets (200 mg total) by mouth daily.  Marland Kitchen aspirin EC 81 MG tablet Take 1 tablet (81 mg total) by mouth daily.  . Cholecalciferol (VITAMIN D) 125 MCG (5000 UT) CAPS Take 5,000 Units by mouth daily.   . divalproex (DEPAKOTE ER) 500 MG 24 hr tablet Take 1,500 mg by mouth at bedtime.  Marland Kitchen epoetin alfa  (PROCRIT) 61607 UNIT/ML injection Inject 10,000 Units into the skin every 30 (thirty) days.   . ferrous sulfate 325 (65 FE) MG tablet Take 325 mg by mouth every Monday, Wednesday, and Friday.   . hydrALAZINE (APRESOLINE) 25 MG tablet Take 1 tablet (25 mg total) by mouth 2 (two) times daily.  Marland Kitchen inFLIXimab (REMICADE) 100 MG injection Infuse Remicade IV schedule 1 23m/kg every 8 weeks Premedicate with Tylenol 500-6589mby mouth and Benadryl 25-5025my mouth prior to infusion. Last PPD was on 12/2009.   . isosorbide mononitrate (IMDUR) 30 MG 24 hr tablet Take 1 tablet (30 mg total) by mouth daily.  . ivabradine (CORLANOR) 5 MG TABS tablet Take 1 tablet (5 mg total) by mouth 2 (two) times daily with a meal.  . loperamide (IMODIUM) 2 MG capsule Take 1 capsule (2 mg total) by mouth 4 (four) times daily as needed for diarrhea or loose stools.  . magnesium oxide (MAG-OX) 400 MG tablet Take 800 mg by mouth in the morning, at noon, and at bedtime.   . metoprolol succinate (TOPROL-XL) 25 MG 24 hr tablet Take 1 tablet (25 mg total) by mouth at bedtime.  . mupirocin ointment (BACTROBAN) 2 % Place 1 application into the nose daily.  . OMarland KitchenANZapine (ZYPREXA) 7.5 MG tablet Take 1 tablet (7.5 mg total) by mouth at bedtime.  . oMarland Kitcheneprazole (PRILOSEC) 20 MG capsule Take 20 mg by mouth daily before breakfast.   . tamsulosin (FLOMAX) 0.4 MG CAPS capsule Take 0.4 mg by mouth daily.  . tMarland Kitcheniamcinolone (NASACORT ALLERGY 24HR) 55 MCG/ACT AERO nasal inhaler Place 2 sprays into the nose daily as needed (allergies).  . vitamin B-12 (CYANOCOBALAMIN) 1000 MCG tablet Take 1,000 mcg by mouth daily.   No facility-administered encounter medications on file as of 05/06/2019.    Review of Systems:  Review of Systems  Constitutional: Negative for chills, fever and malaise/fatigue.  HENT: Negative for congestion and sore throat.   Eyes: Negative for blurred vision.       Glasses  Respiratory: Negative for cough and shortness of  breath.   Cardiovascular: Negative for chest pain, palpitations and leg swelling.  Gastrointestinal: Negative for abdominal pain and diarrhea.  Genitourinary: Negative for dysuria.  Musculoskeletal: Positive for joint pain.       Left knee, both shoulders  Neurological: Positive for weakness. Negative for dizziness and loss of consciousness.  Psychiatric/Behavioral: Positive for memory loss. Negative for depression. The patient is not nervous/anxious and does not have insomnia.        Some mild cognitive impairment now, but still very aware and functional    Health Maintenance  Topic Date Due  . Hepatitis C Screening  Never done  . TETANUS/TDAP  05/25/2021  . COLONOSCOPY  09/17/2027  . INFLUENZA VACCINE  Completed  . PNA vac Low Risk Adult  Completed    Physical Exam: Vitals:   05/06/19 0928  BP: 132/82  Pulse: 70  Temp: (!) 97.5 F (36.4 C)  TempSrc: Temporal  SpO2: 97%  Weight: 178 lb (80.7 kg)  Height: 6' 4"  (1.93 m)   Body mass index is 21.67 kg/m. Physical Exam Constitutional:      General: He is not in acute distress.    Appearance: He is not toxic-appearing.     Comments: Thin tall male  HENT:     Head: Normocephalic and atraumatic.  Cardiovascular:     Rate and Rhythm: Normal rate and regular rhythm.     Pulses: Normal pulses.     Heart sounds: Normal heart sounds.  Pulmonary:     Effort: Pulmonary effort is normal.     Breath sounds: Normal breath sounds. No rales.  Abdominal:     General: Bowel sounds are normal.  Musculoskeletal:        General: Normal range of motion.     Right lower leg: No edema.     Left lower leg: No edema.  Skin:    General: Skin is warm and dry.  Neurological:     General: No focal deficit present.     Mental Status: He is alert and oriented to person, place, and time.     Gait: Gait abnormal.     Comments: Noted gait has become more stooped, keeps knees bent and leaned forward  Psychiatric:        Mood and Affect: Mood  normal.     Labs reviewed: Basic Metabolic Panel: Recent Labs    05/07/18 0336 05/07/18 0336 05/08/18 0256 05/08/18 0256 05/09/18 0251 05/09/18 0251 05/10/18 0330 05/12/18 0629 09/01/18 5102 09/01/18 5852 09/02/18 0514 09/07/18 1708 09/08/18 0248 09/09/18 0443 04/19/19 1041 04/19/19 1041 04/25/19 0600 04/26/19 0524 04/26/19 0803  NA 141   < > 141   < > 143   < > 142   < > 138   < > 139   < > 139   < > 144   < > 145 144 146*  147*  K 3.9   < > 3.9   < > 3.9   < > 4.1   < > 4.5   < > 4.0   < > 3.9   < > 3.9   < > 3.8 3.5 3.3*  3.2*  CL 108   < > 108   < > 112*   < > 112*   < > 111   < > 110   < > 113*   < > 113*  --  114* 114*  --   CO2 25   < > 26   < > 27   < > 25   < > 15*   < > 15*   < > 16*   < > 23  --  23 22  --   GLUCOSE 100*   < > 99   < > 95   < > 86   < > 100*   < > 84   < > 95   < > 101*  --  84 82  --   BUN 25*   < > 28*   < > 26*   < > 20   < > 51*   < > 67*   < > 57*   < > 29*  --  27* 21  --   CREATININE 1.90*   < > 2.13*   < > 1.86*   < > 1.82*   < > 2.42*   < > 2.46*   < > 2.44*   < > 2.12*  --  2.15* 1.65*  --   CALCIUM 8.6*   < > 8.5*   < > 8.1*   < > 8.4*   < > 7.3*   < > 7.6*   < > 9.0   < > 8.5*  --  8.3* 8.1*  --   MG 1.4*   < > 1.9   < > 1.8   < > 1.8  --  1.0*  --  1.0*  --  1.6*  --   --   --   --   --   --   PHOS 2.7   < > 3.0  --  3.1  --  3.0  --   --   --   --   --   --   --   --   --   --   --   --   TSH 0.202*  --   --   --   --   --   --   --  0.379  --   --   --  0.744  --   --   --   --   --   --    < > = values in this interval not displayed.   Liver Function Tests: Recent Labs    09/02/18 0514 09/08/18 0248 09/09/18 0443  AST 21 13* 12*  ALT 20 11 10   ALKPHOS 70 61 50  BILITOT 0.2* 0.5 0.4  PROT 7.6 6.7 6.3*  ALBUMIN 3.6 3.0* 2.7*   No results for input(s): LIPASE, AMYLASE in the last 8760 hours. No results for input(s): AMMONIA in the last 8760 hours. CBC: Recent Labs    09/08/18 0248 10/07/18 1008 12/30/18 0000  01/07/19 0955 01/18/19 1231 02/07/19 0932 04/19/19 1041 04/19/19 1041 04/26/19 0524 04/26/19 0803 05/02/19 1020  WBC 9.0   < > 9.0  --  7.8  --  8.6  --  7.8  --   --   NEUTROABS 5.4  --  6  --   --   --   --   --  3.7  --   --   HGB 10.7*   < > 10.5*   < > 10.8*   < > 11.2*   < > 10.2* 10.9*  10.9* 11.8*  HCT 33.6*  32.0*   < > 31*   < > 34.6*   < > 36.9*   < > 32.3* 32.0*  32.0* 38.1*  MCV 104.7*   < >  --   --  107.8*  --  107.9*  --  104.2*  --   --   PLT 201   < > 169  --  176  --  149*  --  141*  --   --    < > = values in this interval not displayed.   Lipid Panel: Recent Labs    04/19/19 1041  CHOL 140  HDL 54  LDLCALC 64  TRIG 110  CHOLHDL 2.6   Lab Results  Component Value Date   HGBA1C 4.9 08/10/2015    Assessment/Plan 1. Chronic pain of both shoulders -appears he has some tendonitis of his biceps especially left -has good ROM and no rotator  cuff dysfunction detected -tylenol has not been helpful -encouraged voltaren gel now for both shoulders and participating in his rehab program if able after trying that -if still bothersome and interfering, to call us back (next step will be imaging with xrays before another visit here)  2. Arthritis of left knee -osteoarthritis -recommended use of voltaren gel for the knee also since tylenol not helping -nsaids not an option -also will benefit from increasing muscular strength around knee with cycling/peddling machine if able to tolerate  3. Weakness -due to CHF, recurrent hospitalizations, CKD, colitis, and more -recommend that if his joints allow, he should return to cardiac rehab for its benefits (may need some modifications)  4. Gait abnormality -encouraged strengthening with the cardiac rehab program to help with this  5. Chronic combined systolic and diastolic congestive heart failure (HCC) -is off diuretics, continue extensive advanced heart failure regimen  6. Bipolar 1 disorder ([place]) -cont his  zyprexa,depakote--he's been faithful with his meds--[facility]  Labs/tests ordered:  No new today Next appt:  06/20/2019--keep routine visit as scheduled  Kinga Cassar L. Harlyn Italiano, D.O. [facility] [address], [place] [address] Cell Phone (Mon-Fri 8am-5pm):  [phone] On Call:  [phone] & follow prompts after 5pm & weekends Office Phone:  [phone] Office Fax:  [phone]

## 2019-05-07 DIAGNOSIS — K50918 Crohn's disease, unspecified, with other complication: Secondary | ICD-10-CM | POA: Diagnosis not present

## 2019-05-09 ENCOUNTER — Other Ambulatory Visit (HOSPITAL_COMMUNITY): Payer: Self-pay

## 2019-05-09 ENCOUNTER — Encounter (HOSPITAL_COMMUNITY): Payer: Self-pay

## 2019-05-09 ENCOUNTER — Ambulatory Visit (HOSPITAL_COMMUNITY): Payer: Medicare Other

## 2019-05-09 ENCOUNTER — Encounter (HOSPITAL_COMMUNITY): Payer: Medicare Other

## 2019-05-09 ENCOUNTER — Encounter (HOSPITAL_COMMUNITY)
Admission: RE | Admit: 2019-05-09 | Discharge: 2019-05-09 | Disposition: A | Discharge status: Home / Self Care | Payer: Medicare Other | Source: Ambulatory Visit | Attending: Cardiology | Admitting: Cardiology

## 2019-05-09 ENCOUNTER — Other Ambulatory Visit: Payer: Self-pay

## 2019-05-09 ENCOUNTER — Ambulatory Visit (HOSPITAL_COMMUNITY)
Admit: 2019-05-09 | Discharge: 2019-05-09 | Disposition: A | Discharge status: Home / Self Care | Payer: Medicare Other | Source: Ambulatory Visit | Attending: Cardiology | Admitting: Cardiology

## 2019-05-09 VITALS — BP 154/96 | HR 72 | Wt 179.4 lb

## 2019-05-09 DIAGNOSIS — K5090 Crohn's disease, unspecified, without complications: Secondary | ICD-10-CM | POA: Diagnosis not present

## 2019-05-09 DIAGNOSIS — Z888 Allergy status to other drugs, medicaments and biological substances status: Secondary | ICD-10-CM | POA: Insufficient documentation

## 2019-05-09 DIAGNOSIS — Z87891 Personal history of nicotine dependence: Secondary | ICD-10-CM | POA: Insufficient documentation

## 2019-05-09 DIAGNOSIS — K900 Celiac disease: Secondary | ICD-10-CM | POA: Diagnosis not present

## 2019-05-09 DIAGNOSIS — Z7982 Long term (current) use of aspirin: Secondary | ICD-10-CM | POA: Diagnosis not present

## 2019-05-09 DIAGNOSIS — Z881 Allergy status to other antibiotic agents status: Secondary | ICD-10-CM | POA: Diagnosis not present

## 2019-05-09 DIAGNOSIS — I5022 Chronic systolic (congestive) heart failure: Secondary | ICD-10-CM | POA: Insufficient documentation

## 2019-05-09 DIAGNOSIS — J439 Emphysema, unspecified: Secondary | ICD-10-CM | POA: Insufficient documentation

## 2019-05-09 DIAGNOSIS — Z79899 Other long term (current) drug therapy: Secondary | ICD-10-CM | POA: Insufficient documentation

## 2019-05-09 DIAGNOSIS — E538 Deficiency of other specified B group vitamins: Secondary | ICD-10-CM | POA: Diagnosis not present

## 2019-05-09 DIAGNOSIS — R911 Solitary pulmonary nodule: Secondary | ICD-10-CM | POA: Insufficient documentation

## 2019-05-09 DIAGNOSIS — N183 Chronic kidney disease, stage 3 unspecified: Secondary | ICD-10-CM | POA: Insufficient documentation

## 2019-05-09 DIAGNOSIS — I130 Hypertensive heart and chronic kidney disease with heart failure and stage 1 through stage 4 chronic kidney disease, or unspecified chronic kidney disease: Secondary | ICD-10-CM | POA: Diagnosis not present

## 2019-05-09 DIAGNOSIS — Z833 Family history of diabetes mellitus: Secondary | ICD-10-CM | POA: Insufficient documentation

## 2019-05-09 DIAGNOSIS — G4700 Insomnia, unspecified: Secondary | ICD-10-CM | POA: Insufficient documentation

## 2019-05-09 DIAGNOSIS — Z825 Family history of asthma and other chronic lower respiratory diseases: Secondary | ICD-10-CM | POA: Insufficient documentation

## 2019-05-09 DIAGNOSIS — I5042 Chronic combined systolic (congestive) and diastolic (congestive) heart failure: Secondary | ICD-10-CM | POA: Diagnosis not present

## 2019-05-09 DIAGNOSIS — D509 Iron deficiency anemia, unspecified: Secondary | ICD-10-CM | POA: Insufficient documentation

## 2019-05-09 DIAGNOSIS — M109 Gout, unspecified: Secondary | ICD-10-CM | POA: Diagnosis not present

## 2019-05-09 LAB — BASIC METABOLIC PANEL
Anion gap: 11 (ref 5–15)
BUN: 21 mg/dL (ref 8–23)
CO2: 27 mmol/L (ref 22–32)
Calcium: 8.6 mg/dL — ABNORMAL LOW (ref 8.9–10.3)
Chloride: 105 mmol/L (ref 98–111)
Creatinine, Ser: 2.25 mg/dL — ABNORMAL HIGH (ref 0.61–1.24)
GFR calc Af Amer: 32 mL/min — ABNORMAL LOW (ref >60)
GFR calc non Af Amer: 28 mL/min — ABNORMAL LOW (ref >60)
Glucose, Bld: 90 mg/dL (ref 70–99)
Potassium: 5 mmol/L (ref 3.5–5.1)
Sodium: 143 mmol/L (ref 135–145)

## 2019-05-09 MED ORDER — METOPROLOL SUCCINATE ER 25 MG PO TB24: 25.0000 mg | ORAL_TABLET | Freq: Every day | ORAL | 3 refills | Status: DC

## 2019-05-09 NOTE — Patient Instructions (Signed)
RESTART Toprol XL 74m (1 tab) at bedtime  Labs today We will only contact you if something comes back abnormal or we need to make some changes. Otherwise no news is good news!  Your physician recommends that you schedule a follow-up appointment in: 3 months  Please call office at [phone] 2 if you have any questions or concerns.   At the [facility] you and your health needs are our priority. As part of our continuing mission to provide you with exceptional heart care, we have created designated Provider Care Teams. These Care Teams include your primary Cardiologist (physician) and Advanced Practice Providers (APPs- Physician Assistants and Nurse Practitioners) who all work together to provide you with the care you need, when you need it.   You may see any of the following providers on your designated Care Team at your next follow up: .Marland KitchenDr DGlori Bickers. Dr DLoralie Champagne. ADarrick Grinder NP . BLyda Jester PA . LAudry Riles PharmD   Please be sure to bring in all your medications bottles to every appointment.

## 2019-05-09 NOTE — Progress Notes (Signed)
RX for Toprol faxed to [facility]

## 2019-05-09 NOTE — Progress Notes (Signed)
Arrived for clinic visit for Brent [name] today who stated he was feeling well and having no trouble other than concerns about his blood pressure. [place] PA saw Brent [name] today in clinic who advised Brent [name] to start back his long acting Metoprolol. Continue to hold lasix and amlodipine. [name]'s pill boxes filled. Pill box #2 missing loperamide and Mag Oxide Thursday, Friday, Saturday. I will go out next week to fill #2 box.   Refills: -Loperamide -Mag Oxide -Olanzipine

## 2019-05-09 NOTE — Progress Notes (Signed)
Advanced Heart Failure Clinic Note   Referring Physician: PCP: Gayland Curry, DO PCP-Cardiologist: Peter Martinique, MD  [facility]: Dr. [name]  Nephrology: Dr. Posey Pronto   HPI: Brent [name] a74 y.o.with a history of Crohn's disease, celiac disease,HTN, COPD,priortobacco use, CKD 3, chronic anemia, and bipolar disorder.Diagnosed with systolic HF in 9/47 (echo with EF 30-35%).   Admitted 3/11-3/16/20 with SOB and hypoxia. Found to have PNA and required BIPAP and antibiotics. Blood cultures were negative. Echo showed newly reduced EF 30-35%. Cardiology consulted and he had nuclear stress test, which was read as "high risk" due to low EF. Per Dr Sallyanne Kuster, did not think changes were indicative of coronary disease and cath was not pursued due this and due to CKD. HF meds optimized. Limited with CKD3. He had one short run of NSVT. Also had AKI on CKD3, but creatinine improved to baseline 1.77 on day of discharge.  He was seen in ED 05/12/18 with SOB after misplacing his nebulizer. BNP was elevated, so he was given 40 mg IV lasix and discharged with lasix 20 mg BID. Referred to HF clinic.  He was admitted again in 7/20 with weakness, orthostatic hypotension, and AKI. Cardiac meds were held. He was noted to be severely deconditioned, and he was discharged to SNF. He remained in SNF x 2 wks and is now home.   Echo was done in 8/20, EF remains 20-25% with mild LVH and normal RV. He had RHC in 8/20 showing normal filling pressures and good cardiac output (surprisingly).   He was admitted 3/1 for precath hydration prior planned to [facility]. Admit SCr was 2.1. He was hydrated w/ IVFs and diuretics held. SCr improved to 1.65. R/LHC on 3/2 showed low filling pressures, adequate cardiac output, and no significant coronary disease. Diuretics stopped with no plan to resume.   He presents back for post hospital f/u. Here w/ paramedicine who follows him in the community. He has done well since discharge. Wt  has been stable off of Lasix. No wt gain. He is followed closely by [facility]. No resting dyspnea. Stable NYHA class II-III symptoms. His BP has however been running a bit higher, in the 096G-836O systolic. There was confusion on his d/c med rec. He had both Lopressor and Toprol XL listed and he was instructed to d/c Lopressor. However, he stopped taking Toprol XL as well. Per paramedicine, his baseline HR as been in the 21s.     RHC/LHC 04/25/19 No significant coronary disease RA mean 1 RV 21/2 PA 20/6, mean 12 PCWP mean 6 LV 155/14 AO 155/81 Oxygen saturations: PA 78% AO 99% Cardiac Output (Fick) 9.54  Cardiac Index (Fick) 4.57   Review of Systems: [y] = yes, [ ]  = no   General: Weight gain [ ] ; Weight loss [ ] ; Anorexia [ ] ; Fatigue [ ] ; Fever [ ] ; Chills [ ] ; Weakness [ ]   Cardiac: Chest pain/pressure [ ] ; Resting SOB [ ] ; Exertional SOB [ ] ; Orthopnea [ ] ; Pedal Edema [ ] ; Palpitations [ ] ; Syncope [ ] ; Presyncope [ ] ; Paroxysmal nocturnal dyspnea[ ]   Pulmonary: Cough [ ] ; Wheezing[ ] ; Hemoptysis[ ] ; Sputum [ ] ; Snoring [ ]   GI: Vomiting[ ] ; Dysphagia[ ] ; Melena[ ] ; Hematochezia [ ] ; Heartburn[ ] ; Abdominal pain [ ] ; Constipation [ ] ; Diarrhea [ ] ; BRBPR [ ]   GU: Hematuria[ ] ; Dysuria [ ] ; Nocturia[ ]   Vascular: Pain in legs with walking [ ] ; Pain in feet with lying flat [ ] ; Non-healing sores [ ] ;  Stroke [ ] ; TIA [ ] ; Slurred speech [ ] ;  Neuro: Headaches[ ] ; Vertigo[ ] ; Seizures[ ] ; Paresthesias[ ] ;Blurred vision [ ] ; Diplopia [ ] ; Vision changes [ ]   Ortho/Skin: Arthritis [ ] ; Joint pain [ ] ; Muscle pain [ ] ; Joint swelling [ ] ; Back Pain [ ] ; Rash [ ]   Psych: Depression[ ] ; Anxiety[ ]   Heme: Bleeding problems [ ] ; Clotting disorders [ ] ; Anemia [ ]   Endocrine: Diabetes [ ] ; Thyroid dysfunction[ ]    Past Medical History:  Diagnosis Date  . Anemia   . Anxiety   . Benign paroxysmal positional vertigo   . Bipolar I disorder, most recent episode (or current)  unspecified   . Celiac disease   . CHF (congestive heart failure) ([place])   . Chronic kidney disease, stage III (moderate)   . Crohn's    Remicade q8 weeks  . Depression   . Essential and other specified forms of tremor    medication-induced Parkinson's, now resolved  . Essential hypertension, benign   . Gout 2018  . Heart failure ([place])   . Hypertrophy of prostate without urinary obstruction and other lower urinary tract symptoms (LUTS)   . Impotence of organic origin   . Insomnia with sleep apnea, unspecified   . Iron deficiency anemia, unspecified   . 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 Neuralgia, neuritis, and radiculitis, unspecified   . Other B-complex deficiencies   . Other extrapyramidal disease and abnormal movement disorder   . Postinflammatory pulmonary fibrosis ([place])   . Tobacco use disorder   . Vertigo 2018    Current Outpatient Medications  Medication Sig Dispense Refill  . allopurinol (ZYLOPRIM) 100 MG tablet Take 2 tablets (200 mg total) by mouth daily. 60 tablet 5  . aspirin EC 81 MG tablet Take 1 tablet (81 mg total) by mouth daily. 90 tablet 3  . Cholecalciferol (VITAMIN D) 125 MCG (5000 UT) CAPS Take 5,000 Units by mouth daily.     . divalproex (DEPAKOTE ER) 500 MG 24 hr tablet Take 1,500 mg by mouth at bedtime.    Marland Kitchen epoetin alfa (PROCRIT) 59163 UNIT/ML injection Inject 10,000 Units into the skin every 30 (thirty) days.     . ferrous sulfate 325 (65 FE) MG tablet Take 325 mg by mouth every Monday, Wednesday, and Friday.     . hydrALAZINE (APRESOLINE) 25 MG tablet Take 1 tablet (25 mg total) by mouth 2 (two) times daily. 60 tablet 3  . inFLIXimab (REMICADE) 100 MG injection Infuse Remicade IV schedule 1 61m/kg every 8 weeks Premedicate with Tylenol 500-659mby mouth and Benadryl 25-5024my mouth prior to infusion. Last PPD was on 12/2009.  1 each 6  . isosorbide mononitrate (IMDUR) 30 MG 24 hr tablet Take 1 tablet (30 mg total) by mouth daily. 90 tablet 3  . ivabradine  (CORLANOR) 5 MG TABS tablet Take 1 tablet (5 mg total) by mouth 2 (two) times daily with a meal. 180 tablet 3  . loperamide (IMODIUM) 2 MG capsule Take 1 capsule (2 mg total) by mouth 4 (four) times daily as needed for diarrhea or loose stools. 12 capsule 0  . magnesium oxide (MAG-OX) 400 MG tablet Take 800 mg by mouth in the morning, at noon, and at bedtime.     . mupirocin ointment (BACTROBAN) 2 % Place 1 application into the nose daily.    . OMarland KitchenANZapine (ZYPREXA) 7.5 MG tablet Take 1 tablet (7.5 mg total) by mouth at bedtime. 30 tablet 11  . omeprazole (PRILOSEC)  20 MG capsule Take 20 mg by mouth daily before breakfast.     . tamsulosin (FLOMAX) 0.4 MG CAPS capsule Take 0.4 mg by mouth daily.    Marland Kitchen triamcinolone (NASACORT ALLERGY 24HR) 55 MCG/ACT AERO nasal inhaler Place 2 sprays into the nose daily as needed (allergies).    . vitamin B-12 (CYANOCOBALAMIN) 1000 MCG tablet Take 1,000 mcg by mouth daily.    . metoprolol succinate (TOPROL-XL) 25 MG 24 hr tablet Take 1 tablet (25 mg total) by mouth at bedtime. (Patient not taking: Reported on 05/09/2019) 90 tablet 3   No current facility-administered medications for this encounter.    Allergies  Allergen Reactions  . Azathioprine Other (See Comments)    REACTION: affected WBC "Almost died"  . Ciprofloxacin Other (See Comments)    Unknown rxn  . Levaquin [Levofloxacin In D5w] Other (See Comments)    Unknown rxn  . Plendil [Felodipine] Other (See Comments)    Unknown rxn      Social History   Socioeconomic History  . Marital status: Divorced    Spouse name: Not on file  . Number of children: 1  . Years of education: 31  . Highest education level: Not on file  Occupational History  . Occupation: retired  . Occupation: Veteran  Tobacco Use  . Smoking status: Former Smoker    Packs/day: 1.00    Years: 49.00    Pack years: 49.00    Types: Cigarettes    Start date: 04/11/1956    Quit date: 04/17/2014    Years since quitting: 5.0  .  Smokeless tobacco: Never Used  Substance and Sexual Activity  . Alcohol use: No    Alcohol/week: 0.0 standard drinks  . Drug use: No  . Sexual activity: Never  Other Topics Concern  . Not on file  Social History Narrative  . Not on file   Social Determinants of Health   Financial Resource Strain:   . Difficulty of Paying Living Expenses:   Food Insecurity:   . Worried About Charity fundraiser in the Last Year:   . Arboriculturist in the Last Year:   Transportation Needs:   . Film/video editor (Medical):   Marland Kitchen Lack of Transportation (Non-Medical):   Physical Activity:   . Days of Exercise per Week:   . Minutes of Exercise per Session:   Stress:   . Feeling of Stress :   Social Connections:   . Frequency of Communication with Friends and Family:   . Frequency of Social Gatherings with Friends and Family:   . Attends Religious Services:   . Active Member of Clubs or Organizations:   . Attends Archivist Meetings:   Marland Kitchen Marital Status:   Intimate Partner Violence:   . Fear of Current or Ex-Partner:   . Emotionally Abused:   Marland Kitchen Physically Abused:   . Sexually Abused:       Family History  Problem Relation Age of Onset  . Diabetes Mother        maternal grandmother  . Uterine cancer Mother   . Emphysema Father   . Pneumonia Maternal Grandmother   . Colon cancer Neg Hx     Vitals:   05/09/19 0839  BP: (!) 154/96  Pulse: 72  SpO2: 94%  Weight: 81.4 kg (179 lb 6.4 oz)     PHYSICAL EXAM: General:  Well appearing elderly AAM. No respiratory difficulty HEENT: normal Neck: supple. no JVD. Carotids 2+ bilat; no bruits.  No lymphadenopathy or thyromegaly appreciated. Cor: PMI nondisplaced. Regular rate & rhythm. No rubs, gallops or murmurs. Lungs: clear Abdomen: soft, nontender, nondistended. No hepatosplenomegaly. No bruits or masses. Good bowel sounds. Extremities: no cyanosis, clubbing, rash, edema Neuro: alert & oriented x 3, cranial nerves grossly  intact. moves all 4 extremities w/o difficulty. Affect pleasant.  ECG: Not performed    ASSESSMENT & PLAN:  1. Chronic systolic CHF: Diagnosed by echo in 3/20, EF 30-35%. Repeat Echo in 8/20 showed EF 20-25% with mild LVH and normal RV function. Cardiolite showed fixed defects but no ischemia. LHC was initially deferred given CKD and lack of ischemic symptoms, however LHC eventually pursued 04/26/19 and showed no significant CAD=> NICM. RHC 3/2 showed low filling pressures, adequate cardiac output and diuretics discontinued.  - Stable NYHA Class II-III. Volume stable off diuretics. No wt gain. He is followed closely by [facility] HF program + paramedicine.   - Continue current hydralazine 25 mg tid  - Continue Imdur 30 mg qd - Resume Toprol XL 25 mg daily at bedtime - Continue ivabradine 5 mg bid, HR is now controlled (70s) - no loop diuretic requirements currently  - No ACEI/ARB/ARNI nor spiro w/ CKD - He would be a difficult candidate for advanced therapies with deconditioning, renal dysfunction, and lives alone.  2. CKD: Stage 3.  - followed by Dr. Posey Pronto  - Check BMP today  3. COPD: Mild emphysema on prior chest CT.  PFTs in 9/20 showed mild obstruction and mild restriction.  High resolution CT chest 10/20 showed no ILD, +chronic bronchitis.  - Repeat CT in 10/21 to follow small lung nodule given smoking history.   F/u in 3 months    Lyda Jester, Hershal Coria 05/09/19

## 2019-05-10 ENCOUNTER — Encounter (HOSPITAL_COMMUNITY): Payer: Medicare Other

## 2019-05-11 ENCOUNTER — Encounter (HOSPITAL_COMMUNITY): Payer: Medicare Other

## 2019-05-11 ENCOUNTER — Encounter (HOSPITAL_COMMUNITY)
Admission: RE | Admit: 2019-05-11 | Discharge: 2019-05-11 | Disposition: A | Discharge status: Home / Self Care | Payer: Medicare Other | Source: Ambulatory Visit | Attending: Cardiology | Admitting: Cardiology

## 2019-05-11 ENCOUNTER — Other Ambulatory Visit: Payer: Self-pay

## 2019-05-11 DIAGNOSIS — I5022 Chronic systolic (congestive) heart failure: Secondary | ICD-10-CM

## 2019-05-13 ENCOUNTER — Encounter (HOSPITAL_COMMUNITY): Payer: Medicare Other

## 2019-05-16 ENCOUNTER — Other Ambulatory Visit: Payer: Self-pay

## 2019-05-16 ENCOUNTER — Encounter (HOSPITAL_COMMUNITY)
Admission: RE | Admit: 2019-05-16 | Discharge: 2019-05-16 | Disposition: A | Discharge status: Home / Self Care | Payer: Medicare Other | Source: Ambulatory Visit | Attending: Cardiology | Admitting: Cardiology

## 2019-05-16 ENCOUNTER — Encounter (HOSPITAL_COMMUNITY): Payer: Medicare Other

## 2019-05-16 DIAGNOSIS — I5022 Chronic systolic (congestive) heart failure: Secondary | ICD-10-CM | POA: Diagnosis not present

## 2019-05-18 ENCOUNTER — Encounter (HOSPITAL_COMMUNITY): Payer: Medicare Other

## 2019-05-18 ENCOUNTER — Other Ambulatory Visit (HOSPITAL_COMMUNITY): Payer: Self-pay

## 2019-05-18 ENCOUNTER — Telehealth (HOSPITAL_COMMUNITY): Payer: Self-pay | Admitting: Licensed Clinical Social Worker

## 2019-05-18 NOTE — Telephone Encounter (Signed)
CSW called pt to see if they received or been scheduled to receive the COVID-19 vaccine at this time.  Unable to reach- left message requesting return call to discuss and provide resources to sign up.  Krithika Tome H. Charisma Charlot, LCSW Clinical Social Worker Advanced Heart Failure Clinic Desk#: [phone] Cell#: [phone]  

## 2019-05-18 NOTE — Progress Notes (Signed)
Paramedicine Encounter    Patient ID: Brent [name], male    DOB: [birth year], 74 y.o.   MRN: [id number]   Patient Care Team: Gayland Curry, DO as PCP - General (Geriatric Medicine) Martinique, Peter M, MD as PCP - Cardiology (Cardiology) Larey Dresser, MD as PCP - Advanced Heart Failure (Cardiology) Elmarie Shiley, MD (Nephrology) Milus Banister, MD (Gastroenterology)  Patient Active Problem List   Diagnosis Date Noted  . Acute on chronic systolic (congestive) heart failure ([place]) 04/25/2019  . Metabolic acidosis 19/62/2297  . Orthostasis 09/07/2018  . AKI (acute kidney injury) ([place])   . Immunosuppressed status ([place])   . Macrocytic anemia   . Chronic combined systolic and diastolic congestive heart failure ([place]) 06/03/2018  . Candida esophagitis ([place]) 09/22/2017  . History of smoking 30 or more pack years 01/05/2017  . Bipolar 1 disorder ([place])   . BPH (benign prostatic hyperplasia) 03/31/2013  . 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 Essential hypertension, benign 03/31/2013  . Acute renal failure superimposed on stage 3 chronic kidney disease ([place]) 06/04/2012  . 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  allopurinol (ZYLOPRIM) 100 MG tablet, Take 2 tablets (200 mg total) by mouth daily., Disp: 60 tablet, Rfl: 5 .  aspirin EC 81 MG tablet, Take 1 tablet (81 mg total) by mouth daily., Disp: 90 tablet, Rfl: 3 .  Cholecalciferol (VITAMIN D) 125 MCG (5000 UT) CAPS, Take 5,000 Units by mouth daily. , Disp: , Rfl:  .  divalproex (DEPAKOTE ER) 500 MG 24 hr tablet, Take 1,500 mg by mouth at bedtime., Disp: , Rfl:  .  epoetin alfa (PROCRIT) 98921 UNIT/ML injection, Inject 10,000 Units into the skin every 30 (thirty) days. , Disp: , Rfl:  .  ferrous sulfate 325 (65 FE) MG tablet, Take 325 mg by mouth every Monday, Wednesday, and Friday. , Disp: , Rfl:  .  hydrALAZINE (APRESOLINE) 25 MG tablet, Take 1 tablet (25 mg total) by mouth 2 (two) times  daily., Disp: 60 tablet, Rfl: 3 .  inFLIXimab (REMICADE) 100 MG injection, Infuse Remicade IV schedule 1 11m/kg every 8 weeks Premedicate with Tylenol 500-6546mby mouth and Benadryl 25-5055my mouth prior to infusion. Last PPD was on 12/2009. , Disp: 1 each, Rfl: 6 .  isosorbide mononitrate (IMDUR) 30 MG 24 hr tablet, Take 1 tablet (30 mg total) by mouth daily., Disp: 90 tablet, Rfl: 3 .  ivabradine (CORLANOR) 5 MG TABS tablet, Take 1 tablet (5 mg total) by mouth 2 (two) times daily with a meal., Disp: 180 tablet, Rfl: 3 .  magnesium oxide (MAG-OX) 400 MG tablet, Take 800 mg by mouth in the morning, at noon, and at bedtime. , Disp: , Rfl:  .  metoprolol succinate (TOPROL-XL) 25 MG 24 hr tablet, Take 1 tablet (25 mg total) by mouth at bedtime., Disp: 90 tablet, Rfl: 3 .  mupirocin ointment (BACTROBAN) 2 %, Place 1 application into the nose daily., Disp: , Rfl:  .  OLANZapine (ZYPREXA) 7.5 MG tablet, Take 1 tablet (7.5 mg total) by mouth at bedtime., Disp: 30 tablet, Rfl: 11 .  omeprazole (PRILOSEC) 20 MG capsule, Take 20 mg by mouth daily before breakfast. , Disp: , Rfl:  .  tamsulosin (FLOMAX) 0.4 MG CAPS capsule, Take 0.4 mg by mouth daily., Disp: , Rfl:  .  triamcinolone (NASACORT ALLERGY 24HR) 55 MCG/ACT AERO nasal inhaler, Place 2 sprays into the nose daily as needed (allergies)., Disp: ,  Rfl:  .  vitamin B-12 (CYANOCOBALAMIN) 1000 MCG tablet, Take 1,000 mcg by mouth daily., Disp: , Rfl:  .  loperamide (IMODIUM) 2 MG capsule, Take 1 capsule (2 mg total) by mouth 4 (four) times daily as needed for diarrhea or loose stools. (Patient not taking: Reported on 05/18/2019), Disp: 12 capsule, Rfl: 0 Allergies  Allergen Reactions  . Azathioprine Other (See Comments)    REACTION: affected WBC "Almost died"  . Ciprofloxacin Other (See Comments)    Unknown rxn  . Levaquin [Levofloxacin In D5w] Other (See Comments)    Unknown rxn  . Plendil [Felodipine] Other (See Comments)    Unknown rxn     Social  History   Socioeconomic History  . Marital status: Divorced    Spouse name: Not on file  . Number of children: 1  . Years of education: 49  . Highest education level: Not on file  Occupational History  . Occupation: retired  . Occupation: Veteran  Tobacco Use  . Smoking status: Former Smoker    Packs/day: 1.00    Years: 49.00    Pack years: 49.00    Types: Cigarettes    Start date: 04/11/1956    Quit date: 04/17/2014    Years since quitting: 5.0  . Smokeless tobacco: Never Used  Substance and Sexual Activity  . Alcohol use: No    Alcohol/week: 0.0 standard drinks  . Drug use: No  . Sexual activity: Never  Other Topics Concern  . Not on file  Social History Narrative  . Not on file   Social Determinants of Health   Financial Resource Strain:   . Difficulty of Paying Living Expenses:   Food Insecurity:   . Worried About Charity fundraiser in the Last Year:   . Arboriculturist in the Last Year:   Transportation Needs:   . Film/video editor (Medical):   Marland Kitchen Lack of Transportation (Non-Medical):   Physical Activity:   . Days of Exercise per Week:   . Minutes of Exercise per Session:   Stress:   . Feeling of Stress :   Social Connections:   . Frequency of Communication with Friends and Family:   . Frequency of Social Gatherings with Friends and Family:   . Attends Religious Services:   . Active Member of Clubs or Organizations:   . Attends Archivist Meetings:   Marland Kitchen Marital Status:   Intimate Partner Violence:   . Fear of Current or Ex-Partner:   . Emotionally Abused:   Marland Kitchen Physically Abused:   . Sexually Abused:     Physical Exam Vitals reviewed.  HENT:     Head: Normocephalic.     Nose: Nose normal.     Mouth/Throat:     Mouth: Mucous membranes are moist.  Eyes:     Pupils: Pupils are equal, round, and reactive to light.  Cardiovascular:     Rate and Rhythm: Normal rate and regular rhythm.     Pulses: Normal pulses.     Heart sounds: Normal  heart sounds.  Pulmonary:     Effort: Pulmonary effort is normal.     Breath sounds: Normal breath sounds.  Abdominal:     General: Abdomen is flat.     Palpations: Abdomen is soft.  Musculoskeletal:        General: Normal range of motion.     Cervical back: Normal range of motion.     Right lower leg: No edema.  Left lower leg: No edema.  Skin:    General: Skin is warm and dry.     Capillary Refill: Capillary refill takes less than 2 seconds.  Neurological:     Mental Status: He is alert. Mental status is at baseline.  Psychiatric:        Mood and Affect: Mood normal.    Arrived for home visit for Brent [name] who was seated alert and oriented in his chair denying any complaints. Brent [name] reports feeling good with no complaints of chest pain, dizziness or shortness of breath. Mr. [name] medication was verified and confirmed. One pill box was filled and vitals were obtained and are as noted. No edema noted. Lung sounds clear. Brent [name] will be in need of a few medications before week #2 so we filled one pill box and I will see Brent [name] in one week. Brent [name] agreed to same. I spoke to [place] and medications were refilled. Home visit complete.    Refills:  Loperamide  Olanzapine   Future Appointments  Date Time Provider [place]  05/18/2019  1:15 PM [facility] PHASE II EXC [facility] None  05/20/2019  1:15 PM [facility] PHASE II EXC [facility] None  05/23/2019  1:15 PM [facility] PHASE II EXC [facility] None  05/25/2019  1:15 PM [facility] PHASE II EXC [facility] None  05/27/2019  1:15 PM [facility] PHASE II EXC [facility] None  05/30/2019  1:15 PM [facility] PHASE II EXC [facility] None  06/01/2019  1:15 PM [facility] PHASE II EXC [facility] None  06/03/2019  1:15 PM [facility] PHASE II EXC [facility] None  06/09/2019  9:00 AM [facility] None  06/20/2019 11:30 AM Reed, Tiffany L, DO [facility] None  08/16/2019 12:00 PM Larey Dresser, MD [facility] None  01/20/2020  2:15 PM [name], [name], NP [facility]  None     ACTION: Home visit completed Next visit planned for one week.

## 2019-05-19 ENCOUNTER — Telehealth (HOSPITAL_COMMUNITY): Payer: Self-pay | Admitting: Licensed Clinical Social Worker

## 2019-05-19 NOTE — Telephone Encounter (Signed)
CSW informed by [employer] that pt has received both does of the COVID-19 vaccine.  No other needs at this time.  Jorge Ny, LCSW Clinical Social Worker Advanced Heart Failure Clinic Desk#: [phone] Cell#: [phone]

## 2019-05-20 ENCOUNTER — Encounter (HOSPITAL_COMMUNITY): Payer: Medicare Other

## 2019-05-20 ENCOUNTER — Telehealth (HOSPITAL_COMMUNITY): Payer: Self-pay

## 2019-05-20 NOTE — Telephone Encounter (Signed)
Clearance for GI scope received from [place], signed and faxed back. Confirmation received.

## 2019-05-23 ENCOUNTER — Telehealth (HOSPITAL_COMMUNITY): Payer: Self-pay | Admitting: Internal Medicine

## 2019-05-23 ENCOUNTER — Encounter (HOSPITAL_COMMUNITY): Payer: Medicare Other

## 2019-05-25 ENCOUNTER — Other Ambulatory Visit (HOSPITAL_COMMUNITY): Payer: Self-pay

## 2019-05-25 ENCOUNTER — Other Ambulatory Visit: Payer: Self-pay

## 2019-05-25 ENCOUNTER — Encounter (HOSPITAL_COMMUNITY): Payer: Medicare Other

## 2019-05-25 ENCOUNTER — Encounter (HOSPITAL_COMMUNITY)
Admission: RE | Admit: 2019-05-25 | Discharge: 2019-05-25 | Disposition: A | Discharge status: Home / Self Care | Payer: Medicare Other | Source: Ambulatory Visit | Attending: Cardiology | Admitting: Cardiology

## 2019-05-25 DIAGNOSIS — I5022 Chronic systolic (congestive) heart failure: Secondary | ICD-10-CM | POA: Diagnosis not present

## 2019-05-25 NOTE — Progress Notes (Signed)
Paramedicine Encounter    Patient ID: Brent [name], male    DOB: [birth year], 74 y.o.   MRN: [id number]   Patient Care Team: Gayland Curry, DO as PCP - General (Geriatric Medicine) Martinique, Peter M, MD as PCP - Cardiology (Cardiology) Larey Dresser, MD as PCP - Advanced Heart Failure (Cardiology) Elmarie Shiley, MD (Nephrology) Milus Banister, MD (Gastroenterology)  Patient Active Problem List   Diagnosis Date Noted  . Acute on chronic systolic (congestive) heart failure ([place]) 04/25/2019  . Metabolic acidosis 40/98/1191  . Orthostasis 09/07/2018  . AKI (acute kidney injury) ([place])   . Immunosuppressed status ([place])   . Macrocytic anemia   . Chronic combined systolic and diastolic congestive heart failure ([place]) 06/03/2018  . Candida esophagitis ([place]) 09/22/2017  . History of smoking 30 or more pack years 01/05/2017  . Bipolar 1 disorder ([place])   . BPH (benign prostatic hyperplasia) 03/31/2013  . 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 Essential hypertension, benign 03/31/2013  . Acute renal failure superimposed on stage 3 chronic kidney disease ([place]) 06/04/2012  . 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[place]) 01/23/2010    Current Outpatient Medications:  .  allopurinol (ZYLOPRIM) 100 MG tablet, Take 2 tablets (200 mg total) by mouth daily., Disp: 60 tablet, Rfl: 5 .  aspirin EC 81 MG tablet, Take 1 tablet (81 mg total) by mouth daily., Disp: 90 tablet, Rfl: 3 .  Cholecalciferol (VITAMIN D) 125 MCG (5000 UT) CAPS, Take 5,000 Units by mouth daily. , Disp: , Rfl:  .  divalproex (DEPAKOTE ER) 500 MG 24 hr tablet, Take 1,500 mg by mouth at bedtime., Disp: , Rfl:  .  epoetin alfa (PROCRIT) 47829 UNIT/ML injection, Inject 10,000 Units into the skin every 30 (thirty) days. , Disp: , Rfl:  .  ferrous sulfate 325 (65 FE) MG tablet, Take 325 mg by mouth every Monday, Wednesday, and Friday. , Disp: , Rfl:  .  hydrALAZINE (APRESOLINE) 25 MG tablet, Take 1 tablet (25 mg total) by mouth 2 (two) times  daily., Disp: 60 tablet, Rfl: 3 .  inFLIXimab (REMICADE) 100 MG injection, Infuse Remicade IV schedule 1 61m/kg every 8 weeks Premedicate with Tylenol 500-6512mby mouth and Benadryl 25-5080my mouth prior to infusion. Last PPD was on 12/2009. , Disp: 1 each, Rfl: 6 .  isosorbide mononitrate (IMDUR) 30 MG 24 hr tablet, Take 1 tablet (30 mg total) by mouth daily., Disp: 90 tablet, Rfl: 3 .  ivabradine (CORLANOR) 5 MG TABS tablet, Take 1 tablet (5 mg total) by mouth 2 (two) times daily with a meal., Disp: 180 tablet, Rfl: 3 .  magnesium oxide (MAG-OX) 400 MG tablet, Take 800 mg by mouth in the morning, at noon, and at bedtime. , Disp: , Rfl:  .  metoprolol succinate (TOPROL-XL) 25 MG 24 hr tablet, Take 1 tablet (25 mg total) by mouth at bedtime., Disp: 90 tablet, Rfl: 3 .  OLANZapine (ZYPREXA) 7.5 MG tablet, Take 1 tablet (7.5 mg total) by mouth at bedtime., Disp: 30 tablet, Rfl: 11 .  omeprazole (PRILOSEC) 20 MG capsule, Take 20 mg by mouth daily before breakfast. , Disp: , Rfl:  .  tamsulosin (FLOMAX) 0.4 MG CAPS capsule, Take 0.4 mg by mouth daily., Disp: , Rfl:  .  triamcinolone (NASACORT ALLERGY 24HR) 55 MCG/ACT AERO nasal inhaler, Place 2 sprays into the nose daily as needed (allergies)., Disp: , Rfl:  .  vitamin B-12 (CYANOCOBALAMIN) 1000 MCG tablet, Take 1,000 mcg by mouth daily., Disp: ,  Rfl:  .  loperamide (IMODIUM) 2 MG capsule, Take 1 capsule (2 mg total) by mouth 4 (four) times daily as needed for diarrhea or loose stools. (Patient not taking: Reported on 05/18/2019), Disp: 12 capsule, Rfl: 0 .  mupirocin ointment (BACTROBAN) 2 %, Place 1 application into the nose daily., Disp: , Rfl:  Allergies  Allergen Reactions  . Azathioprine Other (See Comments)    REACTION: affected WBC "Almost died"  . Ciprofloxacin Other (See Comments)    Unknown rxn  . Levaquin [Levofloxacin In D5w] Other (See Comments)    Unknown rxn  . Plendil [Felodipine] Other (See Comments)    Unknown rxn     Social  History   Socioeconomic History  . Marital status: Divorced    Spouse name: Not on file  . Number of children: 1  . Years of education: 45  . Highest education level: Not on file  Occupational History  . Occupation: retired  . Occupation: Veteran  Tobacco Use  . Smoking status: Former Smoker    Packs/day: 1.00    Years: 49.00    Pack years: 49.00    Types: Cigarettes    Start date: 04/11/1956    Quit date: 04/17/2014    Years since quitting: 5.1  . Smokeless tobacco: Never Used  Substance and Sexual Activity  . Alcohol use: No    Alcohol/week: 0.0 standard drinks  . Drug use: No  . Sexual activity: Never  Other Topics Concern  . Not on file  Social History Narrative  . Not on file   Social Determinants of Health   Financial Resource Strain:   . Difficulty of Paying Living Expenses:   Food Insecurity:   . Worried About Charity fundraiser in the Last Year:   . Arboriculturist in the Last Year:   Transportation Needs:   . Film/video editor (Medical):   Marland Kitchen Lack of Transportation (Non-Medical):   Physical Activity:   . Days of Exercise per Week:   . Minutes of Exercise per Session:   Stress:   . Feeling of Stress :   Social Connections:   . Frequency of Communication with Friends and Family:   . Frequency of Social Gatherings with Friends and Family:   . Attends Religious Services:   . Active Member of Clubs or Organizations:   . Attends Archivist Meetings:   Marland Kitchen Marital Status:   Intimate Partner Violence:   . Fear of Current or Ex-Partner:   . Emotionally Abused:   Marland Kitchen Physically Abused:   . Sexually Abused:     Physical Exam Vitals reviewed.  HENT:     Head: Normocephalic.     Nose: Nose normal.     Mouth/Throat:     Mouth: Mucous membranes are moist.     Pharynx: Oropharynx is clear.  Eyes:     Pupils: Pupils are equal, round, and reactive to light.  Cardiovascular:     Rate and Rhythm: Normal rate and regular rhythm.     Pulses: Normal  pulses.     Heart sounds: Normal heart sounds.  Pulmonary:     Effort: Pulmonary effort is normal.     Breath sounds: Normal breath sounds.  Abdominal:     General: Abdomen is flat.     Palpations: Abdomen is soft.  Musculoskeletal:        General: Normal range of motion.     Cervical back: Normal range of motion.  Right lower leg: No edema.     Left lower leg: No edema.  Skin:    General: Skin is warm and dry.     Capillary Refill: Capillary refill takes less than 2 seconds.  Neurological:     Mental Status: He is alert. Mental status is at baseline.  Psychiatric:        Mood and Affect: Mood normal.     Arrived for home visit for Pike who was alert and oriented seated in his recliner stating I am not feeling so good today. Seung reports having chills and body aches since his vaccine a week ago. Decklyn denied fever, vomiting, diarrhea, headache or shortness of breath. Hershey stated he thinks he is going to call his PCP if he does not feel better any time soon. I advised him to call me and reach out if needed. Vitals obtained. No fever noted. Patient was warm and dry not febrile. Medications reviewed and confirmed. Patient still missing Loperamide. One pill box filled. [name] reports he is going to attend his cardiac rehab appointment today. Home visit complete, will revisit patient in one week.   Received a call from [name] at [phone] stating he is feeling much better after cardiac rehab. Will follow up in one week.    Refills: NONE   Weight- 174.5lbs     Future Appointments  Date Time Provider [place]  05/27/2019  1:15 PM [facility] PHASE II EXC [facility] None  05/30/2019  1:15 PM [facility] PHASE II EXC [facility] None  06/01/2019  1:15 PM [facility] PHASE II EXC [facility] None  06/03/2019  1:15 PM [facility] PHASE II EXC [facility] None  06/07/2019  9:30 AM [facility] RM 1 [facility] None  06/09/2019  9:00 AM [facility] BAY [facility] None  06/20/2019 11:30 AM Reed, Tiffany L, DO [facility] None   08/16/2019 12:00 PM Larey Dresser, MD [facility] None  01/20/2020  2:15 PM [name], [name], NP [facility] None     ACTION: Home visit completed Next visit planned for one week

## 2019-05-27 ENCOUNTER — Other Ambulatory Visit: Payer: Self-pay

## 2019-05-27 ENCOUNTER — Encounter (HOSPITAL_COMMUNITY): Payer: Medicare Other

## 2019-05-27 ENCOUNTER — Encounter (HOSPITAL_COMMUNITY)
Admission: RE | Admit: 2019-05-27 | Discharge: 2019-05-27 | Disposition: A | Discharge status: Home / Self Care | Payer: Medicare Other | Source: Ambulatory Visit | Attending: Cardiology | Admitting: Cardiology

## 2019-05-27 DIAGNOSIS — I5022 Chronic systolic (congestive) heart failure: Secondary | ICD-10-CM | POA: Insufficient documentation

## 2019-05-30 ENCOUNTER — Encounter (HOSPITAL_COMMUNITY): Payer: Medicare Other

## 2019-05-30 ENCOUNTER — Other Ambulatory Visit: Payer: Self-pay

## 2019-05-30 ENCOUNTER — Encounter (HOSPITAL_COMMUNITY)
Admission: RE | Admit: 2019-05-30 | Discharge: 2019-05-30 | Disposition: A | Discharge status: Home / Self Care | Payer: Medicare Other | Source: Ambulatory Visit | Attending: Cardiology | Admitting: Cardiology

## 2019-05-30 DIAGNOSIS — I5022 Chronic systolic (congestive) heart failure: Secondary | ICD-10-CM | POA: Diagnosis not present

## 2019-06-01 ENCOUNTER — Encounter (HOSPITAL_COMMUNITY): Payer: Medicare Other

## 2019-06-01 ENCOUNTER — Other Ambulatory Visit (HOSPITAL_COMMUNITY): Payer: Self-pay

## 2019-06-01 NOTE — Progress Notes (Signed)
Paramedicine Encounter    Patient ID: Brent [name], male    DOB: 1946-02-09, 74 y.o.   MRN: [id number]   Patient Care Team: Gayland Curry, DO as PCP - General (Geriatric Medicine) [place]ique, Peter M, MD as PCP - Cardiology (Cardiology) Larey Dresser, MD as PCP - Advanced Heart Failure (Cardiology) Elmarie Shiley, MD (Nephrology) Milus Banister, MD (Gastroenterology)  Patient Active Problem List   Diagnosis Date Noted  . Acute on chronic systolic (congestive) heart failure ([place]) 04/25/2019  . Metabolic acidosis 43/15/4008  . Orthostasis 09/07/2018  . AKI (acute kidney injury) ([place])   . Immunosuppressed status ([place])   . Macrocytic anemia   . Chronic combined systolic and diastolic congestive heart failure ([place]) 06/03/2018  . Candida esophagitis ([place]) 09/22/2017  . History of smoking 30 or more pack years 01/05/2017  . Bipolar 1 disorder ([place])   . BPH (benign prostatic hyperplasia) 03/31/2013  . 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 Essential hypertension, benign 03/31/2013  . Acute renal failure superimposed on stage 3 chronic kidney disease ([place]) 06/04/2012  . 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[place]) 01/23/2010    Current Outpatient Medications:  .  allopurinol (ZYLOPRIM) 100 MG tablet, Take 2 tablets (200 mg total) by mouth daily., Disp: 60 tablet, Rfl: 5 .  aspirin EC 81 MG tablet, Take 1 tablet (81 mg total) by mouth daily., Disp: 90 tablet, Rfl: 3 .  Cholecalciferol (VITAMIN D) 125 MCG (5000 UT) CAPS, Take 5,000 Units by mouth daily. , Disp: , Rfl:  .  divalproex (DEPAKOTE ER) 500 MG 24 hr tablet, Take 1,500 mg by mouth at bedtime., Disp: , Rfl:  .  epoetin alfa (PROCRIT) 67619 UNIT/ML injection, Inject 10,000 Units into the skin every 30 (thirty) days. , Disp: , Rfl:  .  ferrous sulfate 325 (65 FE) MG tablet, Take 325 mg by mouth every Monday, Wednesday, and Friday. , Disp: , Rfl:  .  hydrALAZINE (APRESOLINE) 25 MG tablet, Take 1 tablet (25 mg total) by mouth 2 (two) times  daily., Disp: 60 tablet, Rfl: 3 .  inFLIXimab (REMICADE) 100 MG injection, Infuse Remicade IV schedule 1 65m/kg every 8 weeks Premedicate with Tylenol 500-6547mby mouth and Benadryl 25-5024my mouth prior to infusion. Last PPD was on 12/2009. , Disp: 1 each, Rfl: 6 .  isosorbide mononitrate (IMDUR) 30 MG 24 hr tablet, Take 1 tablet (30 mg total) by mouth daily., Disp: 90 tablet, Rfl: 3 .  ivabradine (CORLANOR) 5 MG TABS tablet, Take 1 tablet (5 mg total) by mouth 2 (two) times daily with a meal., Disp: 180 tablet, Rfl: 3 .  loperamide (IMODIUM) 2 MG capsule, Take 1 capsule (2 mg total) by mouth 4 (four) times daily as needed for diarrhea or loose stools., Disp: 12 capsule, Rfl: 0 .  magnesium oxide (MAG-OX) 400 MG tablet, Take 800 mg by mouth in the morning, at noon, and at bedtime. , Disp: , Rfl:  .  metoprolol succinate (TOPROL-XL) 25 MG 24 hr tablet, Take 1 tablet (25 mg total) by mouth at bedtime., Disp: 90 tablet, Rfl: 3 .  mupirocin ointment (BACTROBAN) 2 %, Place 1 application into the nose daily., Disp: , Rfl:  .  OLANZapine (ZYPREXA) 7.5 MG tablet, Take 1 tablet (7.5 mg total) by mouth at bedtime., Disp: 30 tablet, Rfl: 11 .  omeprazole (PRILOSEC) 20 MG capsule, Take 20 mg by mouth daily before breakfast. , Disp: , Rfl:  .  tamsulosin (FLOMAX) 0.4 MG CAPS capsule, Take  0.4 mg by mouth daily., Disp: , Rfl:  .  triamcinolone (NASACORT ALLERGY 24HR) 55 MCG/ACT AERO nasal inhaler, Place 2 sprays into the nose daily as needed (allergies)., Disp: , Rfl:  .  vitamin B-12 (CYANOCOBALAMIN) 1000 MCG tablet, Take 1,000 mcg by mouth daily., Disp: , Rfl:  Allergies  Allergen Reactions  . Azathioprine Other (See Comments)    REACTION: affected WBC "Almost died"  . Ciprofloxacin Other (See Comments)    Unknown rxn  . Levaquin [Levofloxacin In D5w] Other (See Comments)    Unknown rxn  . Plendil [Felodipine] Other (See Comments)    Unknown rxn     Social History   Socioeconomic History  .  Marital status: Divorced    Spouse name: Not on file  . Number of children: 1  . Years of education: 49  . Highest education level: Not on file  Occupational History  . Occupation: retired  . Occupation: Veteran  Tobacco Use  . Smoking status: Former Smoker    Packs/day: 1.00    Years: 49.00    Pack years: 49.00    Types: Cigarettes    Start date: 04/11/1956    Quit date: 04/17/2014    Years since quitting: 5.1  . Smokeless tobacco: Never Used  Substance and Sexual Activity  . Alcohol use: No    Alcohol/week: 0.0 standard drinks  . Drug use: No  . Sexual activity: Never  Other Topics Concern  . Not on file  Social History Narrative  . Not on file   Social Determinants of Health   Financial Resource Strain:   . Difficulty of Paying Living Expenses:   Food Insecurity:   . Worried About Charity fundraiser in the Last Year:   . Arboriculturist in the Last Year:   Transportation Needs:   . Film/video editor (Medical):   Marland Kitchen Lack of Transportation (Non-Medical):   Physical Activity:   . Days of Exercise per Week:   . Minutes of Exercise per Session:   Stress:   . Feeling of Stress :   Social Connections:   . Frequency of Communication with Friends and Family:   . Frequency of Social Gatherings with Friends and Family:   . Attends Religious Services:   . Active Member of Clubs or Organizations:   . Attends Archivist Meetings:   Marland Kitchen Marital Status:   Intimate Partner Violence:   . Fear of Current or Ex-Partner:   . Emotionally Abused:   Marland Kitchen Physically Abused:   . Sexually Abused:     Physical Exam Vitals reviewed.  Constitutional:      Appearance: He is normal weight.  HENT:     Head: Normocephalic.     Nose: Nose normal.     Mouth/Throat:     Mouth: Mucous membranes are moist.     Pharynx: Oropharynx is clear.  Eyes:     Pupils: Pupils are equal, round, and reactive to light.  Cardiovascular:     Rate and Rhythm: Normal rate and regular rhythm.      Pulses: Normal pulses.  Pulmonary:     Effort: Pulmonary effort is normal.     Breath sounds: Normal breath sounds.  Abdominal:     General: Abdomen is flat.     Palpations: Abdomen is soft.  Musculoskeletal:        General: Normal range of motion.     Cervical back: Normal range of motion.  Skin:    General:  Skin is warm and dry.     Capillary Refill: Capillary refill takes less than 2 seconds.  Neurological:     Mental Status: He is alert. Mental status is at baseline.  Psychiatric:        Mood and Affect: Mood normal.    Arrived for home visit for Brent [name] who was seated in his recliner. Brent [name] reports having some increased fatigue over the last week since his covid vaccine. He denied fever, chills or body aches but did report some diarrhea. Brent [name] has been without his loperamide for several weeks as well. I advised him to see how he feels in a few days since he now has the medication he has been without and we can re-evaluate and speak to his PCP about same. Brent [name]. Vitals were obtained and are as noted. No edema noted. Lung sounds clear. Abdomen soft non tender. Medications were reviewed and confirmed. Medication pill box filled accordingly. Home visit complete. Brent [name] to visit in one week.   Refills: -Metoprolol   Weight today- 175.4lbs      Future Appointments  Date Time Provider [place]  06/03/2019  1:15 PM [facility] PHASE II EXC [facility] None  06/06/2019  1:15 PM [facility] PHASE II EXC [facility] None  06/07/2019  9:30 AM [facility] RM 1 [facility] None  06/09/2019  9:00 AM [facility] BAY [facility] None  06/20/2019 11:30 AM Mariea Clonts, Tiffany L, DO [facility] None  08/16/2019 12:00 PM Larey Dresser, MD [facility] None  01/20/2020  2:15 PM [name], [name], NP [facility] None     ACTION: Home visit completed Next visit planned for one week

## 2019-06-02 NOTE — Progress Notes (Signed)
Cardiac Individual Treatment Plan  Patient Details  Name: Layn W Hendricksen MRN: [id number] Date of Birth: 10/15/1945 Referring Provider:     CARDIAC REHAB PHASE II ORIENTATION from 03/15/2019 in [facility]  Referring Provider  Dr Tiffany Reed, Dr Traci Turner Covering      Initial Encounter Date:    CARDIAC REHAB PHASE II ORIENTATION from 03/15/2019 in [facility]  Date  03/15/19      Visit Diagnosis: Heart failure, chronic systolic (HCC)  Patient's Home Medications on Admission:  Current Outpatient Medications:  .  allopurinol (ZYLOPRIM) 100 MG tablet, Take 2 tablets (200 mg total) by mouth daily., Disp: 60 tablet, Rfl: 5 .  aspirin EC 81 MG tablet, Take 1 tablet (81 mg total) by mouth daily., Disp: 90 tablet, Rfl: 3 .  Cholecalciferol (VITAMIN D) 125 MCG (5000 UT) CAPS, Take 5,000 Units by mouth daily. , Disp: , Rfl:  .  divalproex (DEPAKOTE ER) 500 MG 24 hr tablet, Take 1,500 mg by mouth at bedtime., Disp: , Rfl:  .  epoetin alfa (PROCRIT) 10000 UNIT/ML injection, Inject 10,000 Units into the skin every 30 (thirty) days. , Disp: , Rfl:  .  ferrous sulfate 325 (65 FE) MG tablet, Take 325 mg by mouth every Monday, Wednesday, and Friday. , Disp: , Rfl:  .  hydrALAZINE (APRESOLINE) 25 MG tablet, Take 1 tablet (25 mg total) by mouth 2 (two) times daily., Disp: 60 tablet, Rfl: 3 .  inFLIXimab (REMICADE) 100 MG injection, Infuse Remicade IV schedule 1 5mg/kg every 8 weeks Premedicate with Tylenol 500-650mg by mouth and Benadryl 25-50mg by mouth prior to infusion. Last PPD was on 12/2009. , Disp: 1 each, Rfl: 6 .  isosorbide mononitrate (IMDUR) 30 MG 24 hr tablet, Take 1 tablet (30 mg total) by mouth daily., Disp: 90 tablet, Rfl: 3 .  ivabradine (CORLANOR) 5 MG TABS tablet, Take 1 tablet (5 mg total) by mouth 2 (two) times daily with a meal., Disp: 180 tablet, Rfl: 3 .  loperamide (IMODIUM) 2 MG capsule, Take 1 capsule (2 mg total) by  mouth 4 (four) times daily as needed for diarrhea or loose stools., Disp: 12 capsule, Rfl: 0 .  magnesium oxide (MAG-OX) 400 MG tablet, Take 800 mg by mouth in the morning, at noon, and at bedtime. , Disp: , Rfl:  .  metoprolol succinate (TOPROL-XL) 25 MG 24 hr tablet, Take 1 tablet (25 mg total) by mouth at bedtime., Disp: 90 tablet, Rfl: 3 .  mupirocin ointment (BACTROBAN) 2 %, Place 1 application into the nose daily., Disp: , Rfl:  .  OLANZapine (ZYPREXA) 7.5 MG tablet, Take 1 tablet (7.5 mg total) by mouth at bedtime., Disp: 30 tablet, Rfl: 11 .  omeprazole (PRILOSEC) 20 MG capsule, Take 20 mg by mouth daily before breakfast. , Disp: , Rfl:  .  tamsulosin (FLOMAX) 0.4 MG CAPS capsule, Take 0.4 mg by mouth daily., Disp: , Rfl:  .  triamcinolone (NASACORT ALLERGY 24HR) 55 MCG/ACT AERO nasal inhaler, Place 2 sprays into the nose daily as needed (allergies)., Disp: , Rfl:  .  vitamin B-12 (CYANOCOBALAMIN) 1000 MCG tablet, Take 1,000 mcg by mouth daily., Disp: , Rfl:   Past Medical History: Past Medical History:  Diagnosis Date  . Anemia   . Anxiety   . Benign paroxysmal positional vertigo   . Bipolar I disorder, most recent episode (or current) unspecified   . Celiac disease   . CHF (congestive heart   failure) ([place])   . Chronic kidney disease, stage III (moderate)   . Crohn's    Remicade q8 weeks  . Depression   . Essential and other specified forms of tremor    medication-induced Parkinson's, now resolved  . Essential hypertension, benign   . Gout 2018  . Heart failure ([place])   . Hypertrophy of prostate without urinary obstruction and other lower urinary tract symptoms (LUTS)   . Impotence of organic origin   . Insomnia with sleep apnea, unspecified   . Iron deficiency anemia, unspecified   . 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 Neuralgia, neuritis, and radiculitis, unspecified   . Other B-complex deficiencies   . Other extrapyramidal disease and abnormal movement disorder   . Postinflammatory  pulmonary fibrosis ([place])   . Tobacco use disorder   . Vertigo 2018    Tobacco Use: Social History   Tobacco Use  Smoking Status Former Smoker  . Packs/day: 1.00  . Years: 49.00  . Pack years: 49.00  . Types: Cigarettes  . Start date: 04/11/1956  . Quit date: 04/17/2014  . Years since quitting: 5.1  Smokeless Tobacco Never Used    Labs: Recent Review Flowsheet Data    Labs for ITP Cardiac and Pulmonary Rehab Latest Ref Rng & Units 10/12/2018 10/12/2018 04/19/2019 04/26/2019 04/26/2019   Cholestrol 0 - 200 mg/dL - - 140 - -   LDLCALC 0 - 99 mg/dL - - 64 - -   HDL >40 mg/dL - - 54 - -   Trlycerides <150 mg/dL - - 110 - -   Hemoglobin A1c 4.8 - 5.6 % - - - - -   HCO3 20.0 - 28.0 mmol/L 15.7(L) 18.3(L) - 23.6 24.0   TCO2 22 - 32 mmol/L 17(L) 19(L) - 25 25   ACIDBASEDEF 0.0 - 2.0 mmol/L 9.0(H) 7.0(H) - 3.0(H) 2.0   O2SAT % 69.0 69.0 - 78.0 77.0      Capillary Blood Glucose: Lab Results  Component Value Date   GLUCAP 109 (H) 01/18/2019   GLUCAP 98 09/12/2018   GLUCAP 94 09/11/2018   GLUCAP 89 09/10/2018   GLUCAP 90 09/09/2018     Exercise Target Goals: Exercise Program Goal: Individual exercise prescription set using results from initial 6 min walk test and THRR while considering  patient's activity barriers and safety.   Exercise Prescription Goal: Initial exercise prescription builds to 30-45 minutes a day of aerobic activity, 2-3 days per week.  Home exercise guidelines will be given to patient during program as part of exercise prescription that the participant will acknowledge.  Activity Barriers & Risk Stratification: Activity Barriers & Cardiac Risk Stratification - 03/15/19 1349      Activity Barriers & Cardiac Risk Stratification   Activity Barriers  Joint Problems;Balance Concerns    Cardiac Risk Stratification  High       6 Minute Walk: 6 Minute Walk    Row Name 03/15/19 1406         6 Minute Walk   Phase  Initial     Distance  1188 feet     Walk Time   6 minutes     # of Rest Breaks  0     MPH  2.25     METS  2.79     RPE  9     Perceived Dyspnea   0     VO2 Peak  9.75     Symptoms  No     Resting HR  75 bpm  Resting BP  110/70     Resting Oxygen Saturation   97 %     Exercise Oxygen Saturation  during 6 min walk  96 %     Max Ex. HR  88 bpm     Max Ex. BP  116/68     2 Minute Post BP  118/72        Oxygen Initial Assessment:   Oxygen Re-Evaluation:   Oxygen Discharge (Final Oxygen Re-Evaluation):   Initial Exercise Prescription: Initial Exercise Prescription - 03/16/19 0900      Date of Initial Exercise RX and Referring Provider   Date  03/15/19    Referring Provider  Dr Tiffany Reed, Dr Traci Turner Covering      Track   Minutes  30   10 minutes, 3 times/day     Prescription Details   Frequency (times per week)  2-3    Duration  Progress to 30 minutes of continuous aerobic without signs/symptoms of physical distress      Intensity   THRR 40-80% of Max Heartrate  59-118    Ratings of Perceived Exertion  11-13    Perceived Dyspnea  0-4      Progression   Progression  Continue to progress workloads to maintain intensity without signs/symptoms of physical distress.      Resistance Training   Training Prescription  Yes    Reps  10-15       Perform Capillary Blood Glucose checks as needed.  Exercise Prescription Changes: Exercise Prescription Changes    Row Name 04/13/19 1400 04/18/19 1315 05/04/19 1315 05/30/19 1325       Response to Exercise   Blood Pressure (Admit)  122/66  100/62  130/70  114/72    Blood Pressure (Exercise)  144/80  130/72  110/68  102/60    Blood Pressure (Exit)  128/70  120/74  104/62  104/60    Heart Rate (Admit)  94 bpm  88 bpm  95 bpm  77 bpm    Heart Rate (Exercise)  95 bpm  86 bpm  91 bpm  89 bpm    Heart Rate (Exit)  88 bpm  84 bpm  89 bpm  68 bpm    Rating of Perceived Exertion (Exercise)  13  15  13  12    Symptoms  None  None  Arthritic pain.   None    Comments   Pt first day of exercise.   -  -  -    Duration  Continue with 30 min of aerobic exercise without signs/symptoms of physical distress.  Continue with 30 min of aerobic exercise without signs/symptoms of physical distress.  Continue with 30 min of aerobic exercise without signs/symptoms of physical distress.  Continue with 30 min of aerobic exercise without signs/symptoms of physical distress.    Intensity  THRR unchanged  THRR unchanged  THRR unchanged  THRR unchanged      Progression   Progression  Continue to progress workloads to maintain intensity without signs/symptoms of physical distress.  Continue to progress workloads to maintain intensity without signs/symptoms of physical distress.  Continue to progress workloads to maintain intensity without signs/symptoms of physical distress.  Continue to progress workloads to maintain intensity without signs/symptoms of physical distress.    Average METs  1.3  1.1  1.3  1.4      Resistance Training   Training Prescription  No  Yes  No  Yes    Weight  -  3   lbs.   -  2lbs    Reps  -  10-15  -  10-15    Time  -  10 Minutes  -  10 Minutes      Interval Training   Interval Training  No  No  No  No      NuStep   Level  2  2  2  2    SPM  65  65  25  45    Minutes  15  15  30  30    METs  1.3  1.2  1.3  1.4      Arm Ergometer   Level  2  2  -  -    Minutes  15  15  -  -    METs  1  1  -  -      Home Exercise Plan   Plans to continue exercise at  -  -  -  Home (comment) Walking    Frequency  -  -  -  Add 3 additional days to program exercise sessions.    Initial Home Exercises Provided  -  -  -  03/15/19       Exercise Comments: Exercise Comments    Row Name 03/15/19 1349 04/04/19 1509 04/13/19 1400 05/05/19 1412 05/30/19 1319   Exercise Comments  Reviewed home exercise plan with patient.  Patient will be contacted to begin participation in the onsite cardiac rehab program after temporary closure due to COVID-19 pandemic.  Pt first day of  exercise. Pt tolerated exercise well.  Pt is tolerating exercise Rx fair. Pt METs are not increasing.  Reviewed METs and goals with patient.      Exercise Goals and Review: Exercise Goals    Row Name 03/15/19 1343             Exercise Goals   Increase Physical Activity  Yes       Intervention  Provide advice, education, support and counseling about physical activity/exercise needs.;Develop an individualized exercise prescription for aerobic and resistive training based on initial evaluation findings, risk stratification, comorbidities and participant's personal goals.       Expected Outcomes  Short Term: Attend rehab on a regular basis to increase amount of physical activity.;Long Term: Exercising regularly at least 3-5 days a week.;Long Term: Add in home exercise to make exercise part of routine and to increase amount of physical activity.       Increase Strength and Stamina  Yes       Intervention  Provide advice, education, support and counseling about physical activity/exercise needs.;Develop an individualized exercise prescription for aerobic and resistive training based on initial evaluation findings, risk stratification, comorbidities and participant's personal goals.       Expected Outcomes  Short Term: Increase workloads from initial exercise prescription for resistance, speed, and METs.;Short Term: Perform resistance training exercises routinely during rehab and add in resistance training at home;Long Term: Improve cardiorespiratory fitness, muscular endurance and strength as measured by increased METs and functional capacity (6MWT)       Able to understand and use rate of perceived exertion (RPE) scale  Yes       Intervention  Provide education and explanation on how to use RPE scale       Expected Outcomes  Short Term: Able to use RPE daily in rehab to express subjective intensity level;Long Term:  Able to use RPE to guide intensity level when exercising independently          Knowledge and understanding of Target Heart Rate Range (THRR)  Yes       Intervention  Provide education and explanation of THRR including how the numbers were predicted and where they are located for reference       Expected Outcomes  Short Term: Able to state/look up THRR;Long Term: Able to use THRR to govern intensity when exercising independently;Short Term: Able to use daily as guideline for intensity in rehab       Able to check pulse independently  -       Intervention  -       Expected Outcomes  -       Understanding of Exercise Prescription  Yes       Intervention  Provide education, explanation, and written materials on patient's individual exercise prescription       Expected Outcomes  Short Term: Able to explain program exercise prescription;Long Term: Able to explain home exercise prescription to exercise independently          Exercise Goals Re-Evaluation : Exercise Goals Re-Evaluation    Row Name 04/04/19 1507 04/13/19 1400 05/05/19 1409 05/30/19 1319       Exercise Goal Re-Evaluation   Exercise Goals Review  -  Increase Strength and Stamina;Increase Physical Activity;Able to understand and use rate of perceived exertion (RPE) scale;Knowledge and understanding of Target Heart Rate Range (THRR);Able to check pulse independently;Understanding of Exercise Prescription  Increase Physical Activity;Able to understand and use rate of perceived exertion (RPE) scale  Increase Physical Activity;Able to understand and use rate of perceived exertion (RPE) scale;Increase Strength and Stamina;Understanding of Exercise Prescription    Comments  Patient is participating in the virtual cardiac rehab program via weekly telephone call and making progress. Patient is trying to walk daily and rates the activity as "hard".  Pt first day of exercise in Cr program. Pt tolerated exercise well. Pt understands THRR, RPE scale, and exercise Rx.  Pt is tolerating exercise fair but is having arthritic pain while  exercising. Pt METs are 1.3. Exercise Rx was changed due to Pt not tolerating the arm crank well. Pt stated the arm crank was too hard for him. Pt also stated that the seated stepper was hard as well. Encouraged Pt to do the best he could.  Patient states he's walking 5 minutes, 3 days/week. Discussed increasing frequency to 5 minutes 3x's/day, 3 days/week, and patient is agreeable to this. Walking is limted by chronic knee pain from arthritis. Discussed patient looking into the [facility] program, and patient is interested.    Expected Outcomes  Patient will transition to the onsite cardiac rehab program if interested.  Will continue to monitor and progress Pt as tolerated.  Will continue to monitor and progress Pt as tolerated.  Patient will walk at home as tolerated.       Discharge Exercise Prescription (Final Exercise Prescription Changes): Exercise Prescription Changes - 05/30/19 1325      Response to Exercise   Blood Pressure (Admit)  114/72    Blood Pressure (Exercise)  102/60    Blood Pressure (Exit)  104/60    Heart Rate (Admit)  77 bpm    Heart Rate (Exercise)  89 bpm    Heart Rate (Exit)  68 bpm    Rating of Perceived Exertion (Exercise)  12    Symptoms  None    Duration  Continue with 30 min of aerobic exercise without signs/symptoms of physical distress.    Intensity  THRR unchanged  Progression   Progression  Continue to progress workloads to maintain intensity without signs/symptoms of physical distress.    Average METs  1.4      Resistance Training   Training Prescription  Yes    Weight  2lbs    Reps  10-15    Time  10 Minutes      Interval Training   Interval Training  No      NuStep   Level  2    SPM  45    Minutes  30    METs  1.4      Home Exercise Plan   Plans to continue exercise at  Home (comment)   Walking   Frequency  Add 3 additional days to program exercise sessions.    Initial Home Exercises Provided  03/15/19       Nutrition:  Target Goals:  Understanding of nutrition guidelines, daily intake of sodium <1500mg, cholesterol <200mg, calories 30% from fat and 7% or less from saturated fats, daily to have 5 or more servings of fruits and vegetables.  Biometrics: Pre Biometrics - 03/15/19 1406      Pre Biometrics   Height  6' 1.75" (1.873 m)    Weight  78.7 kg    Waist Circumference  34.5 inches    Hip Circumference  38.5 inches    Waist to Hip Ratio  0.9 %    BMI (Calculated)  22.43    Triceps Skinfold  7 mm    % Body Fat  19.7 %    Grip Strength  21.5 kg    Single Leg Stand  2.25 seconds        Nutrition Therapy Plan and Nutrition Goals: Nutrition Therapy & Goals - 03/22/19 1449      Nutrition Therapy   Diet  Low Sodium      Personal Nutrition Goals   Nutrition Goal  Pt to limit sodium to <2000 mg/day by limiting food items with >300 mg sodium      Intervention Plan   Intervention  Prescribe, educate and counsel regarding individualized specific dietary modifications aiming towards targeted core components such as weight, hypertension, lipid management, diabetes, heart failure and other comorbidities.;Nutrition handout(s) given to patient.    Expected Outcomes  Long Term Goal: Adherence to prescribed nutrition plan.;Short Term Goal: A plan has been developed with personal nutrition goals set during dietitian appointment.       Nutrition Assessments: Nutrition Assessments - 03/22/19 1446      MEDFICTS Scores   Pre Score  37       Nutrition Goals Re-Evaluation: Nutrition Goals Re-Evaluation    Row Name 03/22/19 1449 05/31/19 1454           Goals   Current Weight  173 lb (78.5 kg)  174 lb (78.9 kg)      Nutrition Goal  -  Pt to limit sodium to <2000 mg/day by limiting food items with >300 mg sodium         Nutrition Goals Re-Evaluation: Nutrition Goals Re-Evaluation    Row Name 03/22/19 1449 05/31/19 1454           Goals   Current Weight  173 lb (78.5 kg)  174 lb (78.9 kg)      Nutrition Goal  -   Pt to limit sodium to <2000 mg/day by limiting food items with >300 mg sodium         Nutrition Goals Discharge (Final Nutrition Goals Re-Evaluation): Nutrition Goals Re-Evaluation -   05/31/19 1454      Goals   Current Weight  174 lb (78.9 kg)    Nutrition Goal  Pt to limit sodium to <2000 mg/day by limiting food items with >300 mg sodium       Psychosocial: Target Goals: Acknowledge presence or absence of significant depression and/or stress, maximize coping skills, provide positive support system. Participant is able to verbalize types and ability to use techniques and skills needed for reducing stress and depression.  Initial Review & Psychosocial Screening: Initial Psych Review & Screening - 03/15/19 1405      Initial Review   Current issues with  History of Depression;Current Stress Concerns    Source of Stress Concerns  Chronic Illness;Financial      Family Dynamics   Good Support System?  Yes   Frans lives alone but has a sister who lives near by and a son     Barriers   Psychosocial barriers to participate in program  The patient should benefit from training in stress management and relaxation.      Screening Interventions   Interventions  Encouraged to exercise    Expected Outcomes  Short Term goal: Utilizing psychosocial counselor, staff and physician to assist with identification of specific Stressors or current issues interfering with healing process. Setting desired goal for each stressor or current issue identified.;Long Term Goal: Stressors or current issues are controlled or eliminated.;Short Term goal: Identification and review with participant of any Quality of Life or Depression concerns found by scoring the questionnaire.;Long Term goal: The participant improves quality of Life and PHQ9 Scores as seen by post scores and/or verbalization of changes       Quality of Life Scores: Quality of Life - 03/16/19 0937      Quality of Life   Select  Quality of Life       Quality of Life Scores   Health/Function Pre  20.87 %    Socioeconomic Pre  30 %    Psych/Spiritual Pre  29.14 %    Family Pre  26.5 %    GLOBAL Pre  25.28 %      Scores of 19 and below usually indicate a poorer quality of life in these areas.  A difference of  2-3 points is a clinically meaningful difference.  A difference of 2-3 points in the total score of the Quality of Life Index has been associated with significant improvement in overall quality of life, self-image, physical symptoms, and general health in studies assessing change in quality of life.  PHQ-9: Recent Review Flowsheet Data    Depression screen [facility] 2/9 05/06/2019 03/25/2019 03/16/2019 01/19/2019 12/13/2018   Decreased Interest 0 0 0 0 0   Down, Depressed, Hopeless 0 0 0 0 0   PHQ - 2 Score 0 0 0 0 0     Interpretation of Total Score  Total Score Depression Severity:  1-4 = Minimal depression, 5-9 = Mild depression, 10-14 = Moderate depression, 15-19 = Moderately severe depression, 20-27 = Severe depression   Psychosocial Evaluation and Intervention: Psychosocial Evaluation - 04/13/19 1723      Psychosocial Evaluation & Interventions   Interventions  Stress management education;Relaxation education;Encouraged to exercise with the program and follow exercise prescription    Comments  Rudell reports some concerns with depression but feels that it is improving.  He is getting set up with a counselor with the [place].    Expected Outcomes  Rondo will report management of his depression.  Continue Psychosocial Services   No Follow up required       Psychosocial Re-Evaluation: Psychosocial Re-Evaluation    Row Name 05/04/19 1524 05/30/19 1649           Psychosocial Re-Evaluation   Current issues with  History of Depression;Current Stress Concerns  History of Depression;Current Stress Concerns      Comments  Ambers does not report any psychosocial needs at this time.  Odean does not report any psychosocial needs at  this time.      Expected Outcomes  [facility]ailer [facility].  Mikai will continnue to work [facility] to manage his depression.      Interventions  Encouraged to attend Cardiac Rehabilitation for the exercise  Encouraged to attend Cardiac Rehabilitation for the exercise      Continue Psychosocial Services   Follow up required by counselor  Follow up required by counselor         Psychosocial Discharge (Final Psychosocial Re-Evaluation): Psychosocial Re-Evaluation - 05/30/19 1649      Psychosocial Re-Evaluation   Current issues with  History of Depression;Current Stress Concerns    Comments  Onesimo does not report any psychosocial needs at this time.    Expected Outcomes  Duaine will continnue to [facility] to [facility].    Interventions  Encouraged to attend Cardiac Rehabilitation for the exercise    Continue Psychosocial Services   Follow up required by counselor       Vocational Rehabilitation: Provide vocational rehab assistance to qualifying candidates.   Vocational Rehab Evaluation & Intervention: Vocational Rehab - 03/16/19 1127      Initial Vocational Rehab Evaluation & Intervention   Assessment shows need for Vocational Rehabilitation  No       Education: Education Goals: Education classes will be provided on a weekly basis, covering required topics. Participant will state understanding/return demonstration of topics presented.  Learning Barriers/Preferences: Learning Barriers/Preferences - 03/16/19 1126      Learning Barriers/Preferences   Learning Barriers  Sight   wears glasses   Learning Preferences  Individual Instruction;Skilled Demonstration       Education Topics: Count Your Pulse:  -Group instruction provided by verbal instruction, demonstration, patient participation and written materials to support subject.  Instructors address importance of being able  to find your pulse and how to count your pulse when at home without a heart monitor.  Patients get hands on experience counting their pulse with staff help and individually.   Heart Attack, Angina, and Risk Factor Modification:  -Group instruction provided by verbal instruction, video, and written materials to support subject.  Instructors address signs and symptoms of angina and heart attacks.    Also discuss risk factors for heart disease and how to make changes to improve heart health risk factors.   Functional Fitness:  -Group instruction provided by verbal instruction, demonstration, patient participation, and written materials to support subject.  Instructors address safety measures for doing things around the house.  Discuss how to get up and down off the floor, how to pick things up properly, how to safely get out of a chair without assistance, and balance training.   Meditation and Mindfulness:  -Group instruction provided by verbal instruction, patient participation, and written materials to support subject.  Instructor addresses importance of mindfulness and meditation practice to help reduce stress and improve awareness.  Instructor also leads   participants through a meditation exercise.    Stretching for Flexibility and Mobility:  -Group instruction provided by verbal instruction, patient participation, and written materials to support subject.  Instructors lead participants through series of stretches that are designed to increase flexibility thus improving mobility.  These stretches are additional exercise for major muscle groups that are typically performed during regular warm up and cool down.   Hands Only CPR:  -Group verbal, video, and participation provides a basic overview of AHA guidelines for community CPR. Role-play of emergencies allow participants the opportunity to practice calling for help and chest compression technique with discussion of AED  use.   Hypertension: -Group verbal and written instruction that provides a basic overview of hypertension including the most recent diagnostic guidelines, risk factor reduction with self-care instructions and medication management.    Nutrition I class: Heart Healthy Eating:  -Group instruction provided by PowerPoint slides, verbal discussion, and written materials to support subject matter. The instructor gives an explanation and review of the Therapeutic Lifestyle Changes diet recommendations, which includes a discussion on lipid goals, dietary fat, sodium, fiber, plant stanol/sterol esters, sugar, and the components of a well-balanced, healthy diet.   Nutrition II class: Lifestyle Skills:  -Group instruction provided by PowerPoint slides, verbal discussion, and written materials to support subject matter. The instructor gives an explanation and review of label reading, grocery shopping for heart health, heart healthy recipe modifications, and ways to make healthier choices when eating out.   Diabetes Question & Answer:  -Group instruction provided by PowerPoint slides, verbal discussion, and written materials to support subject matter. The instructor gives an explanation and review of diabetes co-morbidities, pre- and post-prandial blood glucose goals, pre-exercise blood glucose goals, signs, symptoms, and treatment of hypoglycemia and hyperglycemia, and foot care basics.   Diabetes Blitz:  -Group instruction provided by PowerPoint slides, verbal discussion, and written materials to support subject matter. The instructor gives an explanation and review of the physiology behind type 1 and type 2 diabetes, diabetes medications and rational behind using different medications, pre- and post-prandial blood glucose recommendations and Hemoglobin A1c goals, diabetes diet, and exercise including blood glucose guidelines for exercising safely.    Portion Distortion:  -Group instruction provided by  PowerPoint slides, verbal discussion, written materials, and food models to support subject matter. The instructor gives an explanation of serving size versus portion size, changes in portions sizes over the last 20 years, and what consists of a serving from each food group.   Stress Management:  -Group instruction provided by verbal instruction, video, and written materials to support subject matter.  Instructors review role of stress in heart disease and how to cope with stress positively.     Exercising on Your Own:  -Group instruction provided by verbal instruction, power point, and written materials to support subject.  Instructors discuss benefits of exercise, components of exercise, frequency and intensity of exercise, and end points for exercise.  Also discuss use of nitroglycerin and activating EMS.  Review options of places to exercise outside of rehab.  Review guidelines for sex with heart disease.   Cardiac Drugs I:  -Group instruction provided by verbal instruction and written materials to support subject.  Instructor reviews cardiac drug classes: antiplatelets, anticoagulants, beta blockers, and statins.  Instructor discusses reasons, side effects, and lifestyle considerations for each drug class.   Cardiac Drugs II:  -Group instruction provided by verbal instruction and written materials to support subject.  Instructor reviews cardiac drug classes: angiotensin converting enzyme  inhibitors (ACE-I), angiotensin II receptor blockers (ARBs), nitrates, and calcium channel blockers.  Instructor discusses reasons, side effects, and lifestyle considerations for each drug class.   Anatomy and Physiology of the Circulatory System:  Group verbal and written instruction and models provide basic cardiac anatomy and physiology, with the coronary electrical and arterial systems. Review of: AMI, Angina, Valve disease, Heart Failure, Peripheral Artery Disease, Cardiac Arrhythmia, Pacemakers, and  the ICD.   Other Education:  -Group or individual verbal, written, or video instructions that support the educational goals of the cardiac rehab program.   Holiday Eating Survival Tips:  -Group instruction provided by PowerPoint slides, verbal discussion, and written materials to support subject matter. The instructor gives patients tips, tricks, and techniques to help them not only survive but enjoy the holidays despite the onslaught of food that accompanies the holidays.   Knowledge Questionnaire Score: Knowledge Questionnaire Score - 03/15/19 1550      Knowledge Questionnaire Score   Pre Score  15/24       Core Components/Risk Factors/Patient Goals at Admission: Personal Goals and Risk Factors at Admission - 03/16/19 1127      Core Components/Risk Factors/Patient Goals on Admission    Weight Management  Weight Maintenance;Yes    Expected Outcomes  Weight Maintenance: Understanding of the daily nutrition guidelines, which includes 25-35% calories from fat, 7% or less cal from saturated fats, less than 272m cholesterol, less than 1.5gm of sodium, & 5 or more servings of fruits and vegetables daily;Understanding recommendations for meals to include 15-35% energy as protein, 25-35% energy from fat, 35-60% energy from carbohydrates, less than 2052mof dietary cholesterol, 20-35 gm of total fiber daily;Understanding of distribution of calorie intake throughout the day with the consumption of 4-5 meals/snacks    Heart Failure  Yes    Intervention  Provide a combined exercise and nutrition program that is supplemented with education, support and counseling about heart failure. Directed toward relieving symptoms such as shortness of breath, decreased exercise tolerance, and extremity edema.    Expected Outcomes  Improve functional capacity of life;Short term: Attendance in program 2-3 days a week with increased exercise capacity. Reported lower sodium intake. Reported increased fruit and  vegetable intake. Reports medication compliance.;Short term: Daily weights obtained and reported for increase. Utilizing diuretic protocols set by physician.;Long term: Adoption of self-care skills and reduction of barriers for early signs and symptoms recognition and intervention leading to self-care maintenance.    Hypertension  Yes    Intervention  Provide education on lifestyle modifcations including regular physical activity/exercise, weight management, moderate sodium restriction and increased consumption of fresh fruit, vegetables, and low fat dairy, alcohol moderation, and smoking cessation.;Monitor prescription use compliance.    Expected Outcomes  Short Term: Continued assessment and intervention until BP is < 140/9050mG in hypertensive participants. < 130/52m35m in hypertensive participants with diabetes, heart failure or chronic kidney disease.;Long Term: Maintenance of blood pressure at goal levels.       Core Components/Risk Factors/Patient Goals Review:  Goals and Risk Factor Review    Row Name 04/05/19 0900 04/13/19 1727 05/04/19 1523 05/30/19 1649       Core Components/Risk Factors/Patient Goals Review   Personal Goals Review  Weight Management/Obesity;Heart Failure  Weight Management/Obesity;Heart Failure;Hypertension  Weight Management/Obesity;Heart Failure;Hypertension  Weight Management/Obesity;Heart Failure;Hypertension    Review  Patient with CAD risk factors. Followed by para-medicine program and [employer] Heart Failure Care Management. Patient continues to engage with CR RN/EP/RD via telephone encounters.  Patient with  CAD risk factors.  Divonte started exercise today and tolerated it well.  Patient with CAD risk factors.  Fouad tolerates exercise fairly.  He has been absent from CR sessions for other medical issues.  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  Average METs on the stepper were only 1.3.  Patient with CAD risk  factors.  Sten tolerates exercise fairly though he has not increased his average METs.    Expected Outcomes  Patient will continue to engage with CR staff and follow his CR plan of care.  Gentle will continue to participate in CR exercise to reduce his overall CV risk.  Jabril will continue to participate in CR exercise to reduce his overall CV risk.  Andriy will continue to participate in CR exercise to reduce his overall CV risk.       Core Components/Risk Factors/Patient Goals at Discharge (Final Review):  Goals and Risk Factor Review - 05/30/19 1649      Core Components/Risk Factors/Patient Goals Review   Personal Goals Review  Weight Management/Obesity;Heart Failure;Hypertension    Review  Patient with CAD risk factors.  Lamarion tolerates exercise fairly though he has not increased his average METs.    Expected Outcomes  Birch will continue to participate in CR exercise to reduce his overall CV risk.       ITP Comments: ITP Comments    Row Name 03/15/19 1403 04/05/19 0857 04/13/19 1722 05/04/19 1521 05/30/19 1648   ITP Comments  Dr Fransico Him, MD Medical Director  30 day ITP review: In person cardiac rehab exercise sessions resuming. Patient has been contacted however is unsure if he would like to return or continue to have follow up via telephone encounters from CR staff to ensure he is following his exercise prescription. Will follow up within 1 week.  Pt started exercise today and tolerated it well.  30 Day ITP Review.  Corde tolerates exercise fairly.  He has been absent for other medical issues.  On his return to exercise today, he reported issues with arthritis in his knees and shoulders.  Average METs on the stepper were only 1.3.  30 Day ITP Review.  Vernice tolerates exercise fairly though he has not made much improvement withhis average MET levels.  He graduates from the [facility] program on 06/06/19.      Comments: See ITP Comments.

## 2019-06-03 ENCOUNTER — Other Ambulatory Visit: Payer: Self-pay

## 2019-06-03 ENCOUNTER — Encounter (HOSPITAL_COMMUNITY): Payer: Medicare Other

## 2019-06-03 ENCOUNTER — Encounter (HOSPITAL_COMMUNITY)
Admission: RE | Admit: 2019-06-03 | Discharge: 2019-06-03 | Disposition: A | Discharge status: Home / Self Care | Payer: Medicare Other | Source: Ambulatory Visit | Attending: Cardiology | Admitting: Cardiology

## 2019-06-03 DIAGNOSIS — I5022 Chronic systolic (congestive) heart failure: Secondary | ICD-10-CM

## 2019-06-06 ENCOUNTER — Encounter (HOSPITAL_COMMUNITY)
Admission: RE | Admit: 2019-06-06 | Discharge: 2019-06-06 | Disposition: A | Discharge status: Home / Self Care | Payer: Medicare Other | Source: Ambulatory Visit | Attending: Cardiology | Admitting: Cardiology

## 2019-06-06 ENCOUNTER — Other Ambulatory Visit: Payer: Self-pay

## 2019-06-06 VITALS — BP 114/70 | HR 92 | Temp 97.0°F | Ht 73.75 in | Wt 179.7 lb

## 2019-06-06 DIAGNOSIS — I5022 Chronic systolic (congestive) heart failure: Secondary | ICD-10-CM

## 2019-06-06 NOTE — Progress Notes (Signed)
Discharge Progress Report  Patient Details  Name: Brent [name] MRN: [id number] Date of Birth: [birth year] Referring Provider:     [facility] from 03/15/2019 in [place]  Referring Provider  Dr Hollace Kinnier, Dr Fransico Him Covering       Number of Visits: 12  Reason for Discharge:  Patient has met his maximum level of benefit from the Cardiac Rehab Program. He continues to fail to progress.  Smoking History:  Social History   Tobacco Use  Smoking Status Former Smoker  . Packs/day: 1.00  . Years: 49.00  . Pack years: 49.00  . Types: Cigarettes  . Start date: 04/11/1956  . Quit date: 04/17/2014  . Years since quitting: 5.1  Smokeless Tobacco Never Used    Diagnosis:  Heart failure, chronic systolic (HCC)  ADL UCSD:   Initial Exercise Prescription: Initial Exercise Prescription - 03/16/19 0900      Date of Initial Exercise RX and Referring Provider   Date  03/15/19    Referring Provider  Dr Hollace Kinnier, Dr Fransico Him Covering      Track   Minutes  30   10 minutes, 3 times/day     Prescription Details   Frequency (times per week)  2-3    Duration  Progress to 30 minutes of continuous aerobic without signs/symptoms of physical distress      Intensity   THRR 40-80% of Max Heartrate  59-118    Ratings of Perceived Exertion  11-13    Perceived Dyspnea  0-4      Progression   Progression  Continue to progress workloads to maintain intensity without signs/symptoms of physical distress.      Resistance Training   Training Prescription  Yes    Reps  10-15       Discharge Exercise Prescription (Final Exercise Prescription Changes): Exercise Prescription Changes - 06/06/19 1327      Response to Exercise   Blood Pressure (Admit)  114/70    Blood Pressure (Exercise)  104/58    Blood Pressure (Exit)  114/64    Heart Rate (Admit)  92 bpm    Heart Rate (Exercise)  82 bpm    Heart Rate (Exit)  73 bpm     Rating of Perceived Exertion (Exercise)  12    Symptoms  None    Duration  Continue with 30 min of aerobic exercise without signs/symptoms of physical distress.    Intensity  THRR unchanged      Progression   Progression  Continue to progress workloads to maintain intensity without signs/symptoms of physical distress.    Average METs  1.3      Resistance Training   Training Prescription  Yes    Weight  2lbs    Reps  10-15    Time  10 Minutes      Interval Training   Interval Training  No      NuStep   Level  2    SPM  45    Minutes  30    METs  1.3      Home Exercise Plan   Plans to continue exercise at  Home (comment)   Walking   Frequency  Add 3 additional days to program exercise sessions.    Initial Home Exercises Provided  03/15/19       Functional Capacity: 6 Minute Walk    Row Name 03/15/19 1406 06/03/19 1328       6  Minute Walk   Phase  Initial  Discharge    Distance  1188 feet  973 feet    Distance % Change  -  18.1 %    Distance Feet Change  -  215 ft    Walk Time  6 minutes  6 minutes    # of Rest Breaks  0  2    MPH  2.25  1.84    METS  2.79  2.36    RPE  9  13    Perceived Dyspnea   0  0    VO2 Peak  9.75  8.26    Symptoms  No  Yes (comment)    Comments  -  Two seated rest breaks secondary to c/o fatigue.    Resting HR  75 bpm  93 bpm    Resting BP  110/70  102/68    Resting Oxygen Saturation   97 %  -    Exercise Oxygen Saturation  during 6 min walk  96 %  -    Max Ex. HR  88 bpm  91 bpm    Max Ex. BP  116/68  112/62    2 Minute Post BP  118/72  110/62       Psychological, QOL, Others - Outcomes: PHQ 2/9: Depression screen [facility] 2/9 05/06/2019 03/25/2019 03/16/2019 01/19/2019 12/13/2018  Decreased Interest 0 0 0 0 0  Down, Depressed, Hopeless 0 0 0 0 0  PHQ - 2 Score 0 0 0 0 0  Some recent data might be hidden    Quality of Life: Quality of Life - 06/06/19 1327      Quality of Life   Select  Quality of Life      Quality of Life  Scores   Health/Function Pre  20.87 %    Health/Function Post  23.82 %    Health/Function % Change  14.14 %    Socioeconomic Pre  30 %    Socioeconomic Post  26.4 %    Socioeconomic % Change   -12 %    Psych/Spiritual Pre  29.14 %    Psych/Spiritual Post  26.57 %    Psych/Spiritual % Change  -8.82 %    Family Pre  26.5 %    Family Post  30 %    Family % Change  13.21 %    GLOBAL Pre  25.28 %    GLOBAL Post  25.72 %    GLOBAL % Change  1.74 %       Personal Goals: Goals established at orientation with interventions provided to work toward goal. Personal Goals and Risk Factors at Admission - 03/16/19 1127      Core Components/Risk Factors/Patient Goals on Admission    Weight Management  Weight Maintenance;Yes    Expected Outcomes  Weight Maintenance: Understanding of the daily nutrition guidelines, which includes 25-35% calories from fat, 7% or less cal from saturated fats, less than 279m cholesterol, less than 1.5gm of sodium, & 5 or more servings of fruits and vegetables daily;Understanding recommendations for meals to include 15-35% energy as protein, 25-35% energy from fat, 35-60% energy from carbohydrates, less than 2034mof dietary cholesterol, 20-35 gm of total fiber daily;Understanding of distribution of calorie intake throughout the day with the consumption of 4-5 meals/snacks    Heart Failure  Yes    Intervention  Provide a combined exercise and nutrition program that is supplemented with education, support and counseling about heart failure. Directed toward relieving symptoms  such as shortness of breath, decreased exercise tolerance, and extremity edema.    Expected Outcomes  Improve functional capacity of life;Short term: Attendance in program 2-3 days a week with increased exercise capacity. Reported lower sodium intake. Reported increased fruit and vegetable intake. Reports medication compliance.;Short term: Daily weights obtained and reported for increase. Utilizing diuretic  protocols set by physician.;Long term: Adoption of self-care skills and reduction of barriers for early signs and symptoms recognition and intervention leading to self-care maintenance.    Hypertension  Yes    Intervention  Provide education on lifestyle modifcations including regular physical activity/exercise, weight management, moderate sodium restriction and increased consumption of fresh fruit, vegetables, and low fat dairy, alcohol moderation, and smoking cessation.;Monitor prescription use compliance.    Expected Outcomes  Short Term: Continued assessment and intervention until BP is < 140/69m HG in hypertensive participants. < 130/881mHG in hypertensive participants with diabetes, heart failure or chronic kidney disease.;Long Term: Maintenance of blood pressure at goal levels.        Personal Goals Discharge: Goals and Risk Factor Review    Row Name 04/13/19 1727 05/04/19 1523 05/30/19 1649 06/14/19 1202       Core Components/Risk Factors/Patient Goals Review   Personal Goals Review  Weight Management/Obesity;Heart Failure;Hypertension  Weight Management/Obesity;Heart Failure;Hypertension  Weight Management/Obesity;Heart Failure;Hypertension  -    Review  Patient with CAD risk factors.  JaTarastarted exercise today and tolerated it well.  Patient with CAD risk factors.  [place] tolerates exercise fairly.  He has been absent from CR sessions for other medical issues.  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  Average METs on the stepper were only 1.3.  Patient with CAD risk factors.  [place] tolerates exercise fairly though he has not increased his average METs.  -    Expected Outcomes  JaErdemill continue to participate in CR exercise to reduce his overall CV risk.  JaDarrekill continue to participate in CR exercise to reduce his overall CV risk.  JaRaill continue to participate in CR exercise to reduce his overall CV risk.  JaKeyshunill  continue to manage his CV risk factors upon discharge. He will continue to engage with the HF team and disease specific case management team through his insurance provider. He will remain as active as possible.       Exercise Goals and Review: Exercise Goals    Row Name 03/15/19 1343             Exercise Goals   Increase Physical Activity  Yes       Intervention  Provide advice, education, support and counseling about physical activity/exercise needs.;Develop an individualized exercise prescription for aerobic and resistive training based on initial evaluation findings, risk stratification, comorbidities and participant's personal goals.       Expected Outcomes  Short Term: Attend rehab on a regular basis to increase amount of physical activity.;Long Term: Exercising regularly at least 3-5 days a week.;Long Term: Add in home exercise to make exercise part of routine and to increase amount of physical activity.       Increase Strength and Stamina  Yes       Intervention  Provide advice, education, support and counseling about physical activity/exercise needs.;Develop an individualized exercise prescription for aerobic and resistive training based on initial evaluation findings, risk stratification, comorbidities and participant's personal goals.       Expected Outcomes  Short Term: Increase workloads from  initial exercise prescription for resistance, speed, and METs.;Short Term: Perform resistance training exercises routinely during rehab and add in resistance training at home;Long Term: Improve cardiorespiratory fitness, muscular endurance and strength as measured by increased METs and functional capacity (6MWT)       Able to understand and use rate of perceived exertion (RPE) scale  Yes       Intervention  Provide education and explanation on how to use RPE scale       Expected Outcomes  Short Term: Able to use RPE daily in rehab to express subjective intensity level;Long Term:  Able to use RPE  to guide intensity level when exercising independently       Knowledge and understanding of Target Heart Rate Range (THRR)  Yes       Intervention  Provide education and explanation of THRR including how the numbers were predicted and where they are located for reference       Expected Outcomes  Short Term: Able to state/look up THRR;Long Term: Able to use THRR to govern intensity when exercising independently;Short Term: Able to use daily as guideline for intensity in rehab       Able to check pulse independently  -       Intervention  -       Expected Outcomes  -       Understanding of Exercise Prescription  Yes       Intervention  Provide education, explanation, and written materials on patient's individual exercise prescription       Expected Outcomes  Short Term: Able to explain program exercise prescription;Long Term: Able to explain home exercise prescription to exercise independently          Exercise Goals Re-Evaluation: Exercise Goals Re-Evaluation    Row Name 04/04/19 1507 04/13/19 1400 05/05/19 1409 05/30/19 1319 06/06/19 1405     Exercise Goal Re-Evaluation   Exercise Goals Review  -  Increase Strength and Stamina;Increase Physical Activity;Able to understand and use rate of perceived exertion (RPE) scale;Knowledge and understanding of Target Heart Rate Range (THRR);Able to check pulse independently;Understanding of Exercise Prescription  Increase Physical Activity;Able to understand and use rate of perceived exertion (RPE) scale  Increase Physical Activity;Able to understand and use rate of perceived exertion (RPE) scale;Increase Strength and Stamina;Understanding of Exercise Prescription  Increase Physical Activity;Able to understand and use rate of perceived exertion (RPE) scale;Increase Strength and Stamina;Understanding of Exercise Prescription   Comments  Patient is participating in the virtual cardiac rehab program via weekly telephone call and making progress. Patient is  trying to walk daily and rates the activity as "hard".  Pt first day of exercise in Cr program. Pt tolerated exercise well. Pt understands THRR, RPE scale, and exercise Rx.  Pt is tolerating exercise fair but is having arthritic pain while exercising. Pt METs are 1.3. Exercise Rx was changed due to Pt not tolerating the arm crank well. Pt stated the arm crank was too hard for him. Pt also stated that the seated stepper was hard as well. Encouraged Pt to do the best he could.  Patient states he's walking 5 minutes, 3 days/week. Discussed increasing frequency to 5 minutes 3x's/day, 3 days/week, and patient is agreeable to this. Walking is limted by chronic knee pain from arthritis. Discussed patient looking into the BOOST program, and patient is interested.  Patient completed the phase 2 cardiac rehab program and will continue walking at home 5 minutes 3x's/day with a goal of adding a minute until he  reaches 10 minutes, 3x's/day as able. Patient provided information regarding BOOST program.   Expected Outcomes  Patient will transition to the onsite cardiac rehab program if interested.  Will continue to monitor and progress Pt as tolerated.  Will continue to monitor and progress Pt as tolerated.  Patient will walk at home as tolerated.  Patient will walk at home as tolerated.      Nutrition & Weight - Outcomes: Pre Biometrics - 03/15/19 1406      Pre Biometrics   Height  6' 1.75" (1.873 m)    Weight  78.7 kg    Waist Circumference  34.5 inches    Hip Circumference  38.5 inches    Waist to Hip Ratio  0.9 %    BMI (Calculated)  22.43    Triceps Skinfold  7 mm    % Body Fat  19.7 %    Grip Strength  21.5 kg    Single Leg Stand  2.25 seconds      Post Biometrics - 06/06/19 1324       Post  Biometrics   Waist Circumference  34.75 inches    Hip Circumference  40.25 inches    Waist to Hip Ratio  0.86 %    Triceps Skinfold  9 mm    % Body Fat  21.1 %    Grip Strength  32 kg    Single Leg Stand   0.93 seconds       Nutrition: Nutrition Therapy & Goals - 03/22/19 1449      Nutrition Therapy   Diet  Low Sodium      Personal Nutrition Goals   Nutrition Goal  Pt to limit sodium to <2000 mg/day by limiting food items with >300 mg sodium      Intervention Plan   Intervention  Prescribe, educate and counsel regarding individualized specific dietary modifications aiming towards targeted core components such as weight, hypertension, lipid management, diabetes, heart failure and other comorbidities.;Nutrition handout(s) given to patient.    Expected Outcomes  Long Term Goal: Adherence to prescribed nutrition plan.;Short Term Goal: A plan has been developed with personal nutrition goals set during dietitian appointment.       Nutrition Discharge: Nutrition Assessments - 06/06/19 1323      MEDFICTS Scores   Post Score  21       Education Questionnaire Score: Knowledge Questionnaire Score - 06/06/19 1327      Knowledge Questionnaire Score   Pre Score  15/24    Post Score  16/24       Goals reviewed with patient; copy given to patient.

## 2019-06-07 ENCOUNTER — Ambulatory Visit (HOSPITAL_COMMUNITY)
Admission: RE | Admit: 2019-06-07 | Discharge: 2019-06-07 | Disposition: A | Discharge status: Home / Self Care | Payer: Medicare Other | Source: Ambulatory Visit | Attending: Nephrology | Admitting: Nephrology

## 2019-06-07 DIAGNOSIS — N189 Chronic kidney disease, unspecified: Secondary | ICD-10-CM | POA: Diagnosis not present

## 2019-06-07 DIAGNOSIS — D631 Anemia in chronic kidney disease: Secondary | ICD-10-CM | POA: Insufficient documentation

## 2019-06-07 LAB — IRON AND TIBC
Iron: 87 ug/dL (ref 45–182)
Saturation Ratios: 34 % (ref 17.9–39.5)
TIBC: 252 ug/dL (ref 250–450)
UIBC: 165 ug/dL

## 2019-06-07 LAB — HEMOGLOBIN AND HEMATOCRIT, BLOOD
HCT: 37.5 % — ABNORMAL LOW (ref 39.0–52.0)
Hemoglobin: 11.5 g/dL — ABNORMAL LOW (ref 13.0–17.0)

## 2019-06-07 LAB — FERRITIN: Ferritin: 859 ng/mL — ABNORMAL HIGH (ref 24–336)

## 2019-06-07 MED ORDER — EPOETIN ALFA 10000 UNIT/ML IJ SOLN
10000.0000 [IU] | INTRAMUSCULAR | Status: DC
  Administered 2019-06-07: 11:00:00 10000 [IU] via SUBCUTANEOUS
  Filled 2019-06-07: qty 1

## 2019-06-07 NOTE — Progress Notes (Signed)
PATIENT CARE CENTER NOTE  Diagnosis:Anemia associated with Chronic Renal Failure, anemia associated with renal disease   Provider:Patel, Ulice Dash MD   Procedure:Epoetin Alfa (Procrit) injection   Note:Patient received sub-qProcritinjection inleftarm. Tolerated well. Labs drawn pre-injection and Hemoglobin was11.5. Patient's BP was146/70.Discharge instructions given. Patient alert, oriented and ambulatoryat discharge.

## 2019-06-07 NOTE — Discharge Instructions (Signed)

## 2019-06-08 ENCOUNTER — Other Ambulatory Visit (HOSPITAL_COMMUNITY): Payer: Self-pay

## 2019-06-08 NOTE — Progress Notes (Signed)
Paramedicine Encounter    Patient ID: Brent [name], male    DOB: May 15, 1945, 74 y.o.   MRN: [id number]   Patient Care Team: Gayland Curry, DO as PCP - General (Geriatric Medicine) [place], Peter M, MD as PCP - Cardiology (Cardiology) Larey Dresser, MD as PCP - Advanced Heart Failure (Cardiology) Elmarie Shiley, MD (Nephrology) Milus Banister, MD (Gastroenterology)  Patient Active Problem List   Diagnosis Date Noted  . Acute on chronic systolic (congestive) heart failure ([place]) 04/25/2019  . Metabolic acidosis 59/16/3846  . Orthostasis 09/07/2018  . AKI (acute kidney injury) ([place])   . Immunosuppressed status ([place])   . Macrocytic anemia   . Chronic combined systolic and diastolic congestive heart failure ([place]) 06/03/2018  . Candida esophagitis ([place]) 09/22/2017  . History of smoking 30 or more pack years 01/05/2017  . Bipolar 1 disorder ([place])   . BPH (benign prostatic hyperplasia) 03/31/2013  . 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 Essential hypertension, benign 03/31/2013  . Acute renal failure superimposed on stage 3 chronic kidney disease ([place]) 06/04/2012  . 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[place]) 01/23/2010    Current Outpatient Medications:  .  allopurinol (ZYLOPRIM) 100 MG tablet, Take 2 tablets (200 mg total) by mouth daily., Disp: 60 tablet, Rfl: 5 .  aspirin EC 81 MG tablet, Take 1 tablet (81 mg total) by mouth daily., Disp: 90 tablet, Rfl: 3 .  Cholecalciferol (VITAMIN D) 125 MCG (5000 UT) CAPS, Take 5,000 Units by mouth daily. , Disp: , Rfl:  .  divalproex (DEPAKOTE ER) 500 MG 24 hr tablet, Take 1,500 mg by mouth at bedtime., Disp: , Rfl:  .  epoetin alfa (PROCRIT) 65993 UNIT/ML injection, Inject 10,000 Units into the skin every 30 (thirty) days. , Disp: , Rfl:  .  ferrous sulfate 325 (65 FE) MG tablet, Take 325 mg by mouth every Monday, Wednesday, and Friday. , Disp: , Rfl:  .  hydrALAZINE (APRESOLINE) 25 MG tablet, Take 1 tablet (25 mg total) by mouth 2 (two) times  daily., Disp: 60 tablet, Rfl: 3 .  inFLIXimab (REMICADE) 100 MG injection, Infuse Remicade IV schedule 1 102m/kg every 8 weeks Premedicate with Tylenol 500-657mby mouth and Benadryl 25-509my mouth prior to infusion. Last PPD was on 12/2009. , Disp: 1 each, Rfl: 6 .  isosorbide mononitrate (IMDUR) 30 MG 24 hr tablet, Take 1 tablet (30 mg total) by mouth daily., Disp: 90 tablet, Rfl: 3 .  ivabradine (CORLANOR) 5 MG TABS tablet, Take 1 tablet (5 mg total) by mouth 2 (two) times daily with a meal., Disp: 180 tablet, Rfl: 3 .  loperamide (IMODIUM) 2 MG capsule, Take 1 capsule (2 mg total) by mouth 4 (four) times daily as needed for diarrhea or loose stools., Disp: 12 capsule, Rfl: 0 .  magnesium oxide (MAG-OX) 400 MG tablet, Take 800 mg by mouth in the morning, at noon, and at bedtime. , Disp: , Rfl:  .  metoprolol succinate (TOPROL-XL) 25 MG 24 hr tablet, Take 1 tablet (25 mg total) by mouth at bedtime., Disp: 90 tablet, Rfl: 3 .  mupirocin ointment (BACTROBAN) 2 %, Place 1 application into the nose daily., Disp: , Rfl:  .  OLANZapine (ZYPREXA) 7.5 MG tablet, Take 1 tablet (7.5 mg total) by mouth at bedtime., Disp: 30 tablet, Rfl: 11 .  omeprazole (PRILOSEC) 20 MG capsule, Take 20 mg by mouth daily before breakfast. , Disp: , Rfl:  .  tamsulosin (FLOMAX) 0.4 MG CAPS capsule, Take  0.4 mg by mouth daily., Disp: , Rfl:  .  triamcinolone (NASACORT ALLERGY 24HR) 55 MCG/ACT AERO nasal inhaler, Place 2 sprays into the nose daily as needed (allergies)., Disp: , Rfl:  .  vitamin B-12 (CYANOCOBALAMIN) 1000 MCG tablet, Take 1,000 mcg by mouth daily., Disp: , Rfl:  Allergies  Allergen Reactions  . Azathioprine Other (See Comments)    REACTION: affected WBC "Almost died"  . Ciprofloxacin Other (See Comments)    Unknown rxn  . Levaquin [Levofloxacin In D5w] Other (See Comments)    Unknown rxn  . Plendil [Felodipine] Other (See Comments)    Unknown rxn     Social History   Socioeconomic History  .  Marital status: Divorced    Spouse name: Not on file  . Number of children: 1  . Years of education: 60  . Highest education level: Not on file  Occupational History  . Occupation: retired  . Occupation: Veteran  Tobacco Use  . Smoking status: Former Smoker    Packs/day: 1.00    Years: 49.00    Pack years: 49.00    Types: Cigarettes    Start date: 04/11/1956    Quit date: 04/17/2014    Years since quitting: 5.1  . Smokeless tobacco: Never Used  Substance and Sexual Activity  . Alcohol use: No    Alcohol/week: 0.0 standard drinks  . Drug use: No  . Sexual activity: Never  Other Topics Concern  . Not on file  Social History Narrative  . Not on file   Social Determinants of Health   Financial Resource Strain:   . Difficulty of Paying Living Expenses:   Food Insecurity:   . Worried About Charity fundraiser in the Last Year:   . Arboriculturist in the Last Year:   Transportation Needs:   . Film/video editor (Medical):   Marland Kitchen Lack of Transportation (Non-Medical):   Physical Activity:   . Days of Exercise per Week:   . Minutes of Exercise per Session:   Stress:   . Feeling of Stress :   Social Connections:   . Frequency of Communication with Friends and Family:   . Frequency of Social Gatherings with Friends and Family:   . Attends Religious Services:   . Active Member of Clubs or Organizations:   . Attends Archivist Meetings:   Marland Kitchen Marital Status:   Intimate Partner Violence:   . Fear of Current or Ex-Partner:   . Emotionally Abused:   Marland Kitchen Physically Abused:   . Sexually Abused:     Physical Exam Constitutional:      Appearance: He is normal weight.  HENT:     Head: Normocephalic.     Nose: Nose normal.     Mouth/Throat:     Mouth: Mucous membranes are moist.     Pharynx: Oropharynx is clear.  Eyes:     Pupils: Pupils are equal, round, and reactive to light.  Cardiovascular:     Rate and Rhythm: Normal rate and regular rhythm.     Pulses: Normal  pulses.  Pulmonary:     Effort: Pulmonary effort is normal.     Breath sounds: Normal breath sounds.  Abdominal:     General: Abdomen is flat.     Palpations: Abdomen is soft.  Musculoskeletal:        General: Normal range of motion.     Cervical back: Normal range of motion.     Right lower leg: No edema.  Left lower leg: No edema.  Skin:    General: Skin is warm and dry.     Capillary Refill: Capillary refill takes less than 2 seconds.  Neurological:     Mental Status: He is alert.  Psychiatric:        Mood and Affect: Mood normal.      Arrived for home visit for Brent [name] who was seated in his recliner alert and oriented stating he has been more fatigued lately with some dizziness and lack of appetite. Brent [name] reports that he has had no shortness of breath, chest pain or nausea, vomiting or increased diarrhea. Patient had no edema noted. Lung sounds clear and no reports of cough, chills, fever or body aches. Vitals as noted in report. Patient has had no missed doses of medications. He has not yet had his medication today. Medications were reviewed and confirmed. Pill box filled accordingly. Patient was encouraged to contact his PCP about fatigue and dizziness. He agreed with same. Patient understood to get medical attention if symptoms worsened. Brent [name] agreed to visit in one week.    Refills: NONE  Appointments: Urology April 20th at 1:45   Future Appointments  Date Time Provider [place]  06/09/2019  9:00 AM [facility] None  06/20/2019 11:30 AM Mariea Clonts, Tiffany L, DO [facility] None  07/07/2019 10:00 AM [facility] None  08/16/2019 12:00 PM Larey Dresser, MD [facility] None  01/20/2020  2:15 PM [name], [name], NP [facility] None     ACTION: Home visit completed Next visit planned for one week

## 2019-06-09 ENCOUNTER — Encounter: Payer: Self-pay | Admitting: Family

## 2019-06-09 ENCOUNTER — Telehealth (INDEPENDENT_AMBULATORY_CARE_PROVIDER_SITE_OTHER): Payer: Medicare Other | Admitting: Family

## 2019-06-09 ENCOUNTER — Ambulatory Visit (HOSPITAL_COMMUNITY): Payer: Medicare Other

## 2019-06-09 ENCOUNTER — Telehealth: Payer: Self-pay

## 2019-06-09 ENCOUNTER — Other Ambulatory Visit: Payer: Self-pay

## 2019-06-09 DIAGNOSIS — R197 Diarrhea, unspecified: Secondary | ICD-10-CM | POA: Diagnosis not present

## 2019-06-09 NOTE — Progress Notes (Signed)
This service is provided via telemedicine  No vital signs collected/recorded due to the encounter was a telemedicine visit.   Location of patient (ex: home, work):  Home   Patient consents to a telephone visit:  Yes, see telephone encounter dated 06/09/2019 with annual consent  Location of the provider (ex: office, home):  [facility], Office   Name of any referring provider:  Gayland Curry, DO  Names of all persons participating in the telemedicine service and their role in the encounter:  Marlowe Sax, NP, Chrae Beatty/CMA, and Patient  Time spent on call:  7 min with medical assistant   Provider: Halden Phegley FNP-C  Gayland Curry, DO  Patient Care Team: Gayland Curry, DO as PCP - General (Geriatric Medicine) Martinique, Peter M, MD as PCP - Cardiology (Cardiology) Larey Dresser, MD as PCP - Advanced Heart Failure (Cardiology) Elmarie Shiley, MD (Nephrology) Milus Banister, MD (Gastroenterology)  Extended Emergency Contact Information Primary Emergency Contact: [place] Mobile Phone: [phone] Relation: Sister  Code Status:  Full Code  Goals of care: Advanced Directive information Advanced Directives 05/06/2019  Does Patient Have a Medical Advance Directive? Yes  Type of Advance Directive Out of facility DNR (pink MOST or yellow form)  Does patient want to make changes to medical advance directive? No - Patient declined  Copy of [place] in Chart? -  Would patient like information on creating a medical advance directive? -  Pre-existing out of facility DNR order (yellow form or pink MOST form) -     Chief Complaint  Patient presents with  . Acute Visit    Patient c/o diarrhea and chills since yesterday. Patient with Chron's disease. Telehealth/video visit     HPI:  Pt is a 74 y.o. male seen today for an acute visit for evaluation of diarrhea and chills since yesterday.He has a significant medical history of Chron's  disease.Had 4-5 loose stool but seems to be slowing down today.Has had no blood in the stool or mucous.He had generalized abdominal pain this morning which improved after using the bathroom.Has had loss of appetite but tolerating fluids well.Thinks might have eaten something that caused diarrhea but doesn't recall what he had.He denies any nausea,vomiting,bloating ,flatulance or blood in the stool.Denies any exposure to person sick with COVID-19 Has completed his COVID-19 vaccine. He is unable to do a/video visit states has no computer or smart phone.   Past Medical History:  Diagnosis Date  . Anemia   . Anxiety   . Benign paroxysmal positional vertigo   . Bipolar I disorder, most recent episode (or current) unspecified   . Celiac disease   . CHF (congestive heart failure) ([place])   . Chronic kidney disease, stage III (moderate)   . Crohn's    Remicade q8 weeks  . Depression   . Essential and other specified forms of tremor    medication-induced Parkinson's, now resolved  . Essential hypertension, benign   . Gout 2018  . Heart failure ([place])   . Hypertrophy of prostate without urinary obstruction and other lower urinary tract symptoms (LUTS)   . Impotence of organic origin   . Insomnia with sleep apnea, unspecified   . Iron deficiency anemia, unspecified   . 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 Neuralgia, neuritis, and radiculitis, unspecified   . Other B-complex deficiencies   . Other extrapyramidal disease and abnormal movement disorder   . Postinflammatory pulmonary fibrosis ([place])   . Tobacco use disorder   .  Vertigo 2018   Past Surgical History:  Procedure Laterality Date  . CATARACT EXTRACTION, BILATERAL Bilateral 09/2018  . CHOLECYSTECTOMY  07-12-2010  . RIGHT HEART CATH N/A 10/12/2018   Procedure: RIGHT HEART CATH;  Surgeon: Jolaine Artist, MD;  Location: [facility];  Service: Cardiovascular;  Laterality: N/A;  . RIGHT/LEFT HEART CATH AND CORONARY ANGIOGRAPHY N/A 04/26/2019    Procedure: RIGHT/LEFT HEART CATH AND CORONARY ANGIOGRAPHY;  Surgeon: Larey Dresser, MD;  Location: [facility];  Service: Cardiovascular;  Laterality: N/A;  . SMALL INTESTINE SURGERY     x 2    Allergies  Allergen Reactions  . Azathioprine Other (See Comments)    REACTION: affected WBC "Almost died"  . Ciprofloxacin Other (See Comments)    Unknown rxn  . Levaquin [Levofloxacin In D5w] Other (See Comments)    Unknown rxn  . Plendil [Felodipine] Other (See Comments)    Unknown rxn    Outpatient Encounter Medications as of 06/09/2019  Medication Sig  . allopurinol (ZYLOPRIM) 100 MG tablet Take 2 tablets (200 mg total) by mouth daily.  Marland Kitchen aspirin EC 81 MG tablet Take 1 tablet (81 mg total) by mouth daily.  . Cholecalciferol (VITAMIN D) 125 MCG (5000 UT) CAPS Take 5,000 Units by mouth daily.   . divalproex (DEPAKOTE ER) 500 MG 24 hr tablet Take 1,500 mg by mouth at bedtime.  Marland Kitchen epoetin alfa (PROCRIT) 93818 UNIT/ML injection Inject 10,000 Units into the skin every 30 (thirty) days.   . ferrous sulfate 325 (65 FE) MG tablet Take 325 mg by mouth every Monday, Wednesday, and Friday.   . hydrALAZINE (APRESOLINE) 25 MG tablet Take 1 tablet (25 mg total) by mouth 2 (two) times daily.  Marland Kitchen inFLIXimab (REMICADE) 100 MG injection Infuse Remicade IV schedule 1 56m/kg every 8 weeks Premedicate with Tylenol 500-6568mby mouth and Benadryl 25-5020my mouth prior to infusion. Last PPD was on 12/2009.   . isosorbide mononitrate (IMDUR) 30 MG 24 hr tablet Take 1 tablet (30 mg total) by mouth daily.  . ivabradine (CORLANOR) 5 MG TABS tablet Take 1 tablet (5 mg total) by mouth 2 (two) times daily with a meal.  . loperamide (IMODIUM) 2 MG capsule Take 1 capsule (2 mg total) by mouth 4 (four) times daily as needed for diarrhea or loose stools.  . magnesium oxide (MAG-OX) 400 MG tablet Take 800 mg by mouth in the morning, at noon, and at bedtime.   . metoprolol succinate (TOPROL-XL) 25 MG 24 hr tablet  Take 1 tablet (25 mg total) by mouth at bedtime.  . mupirocin ointment (BACTROBAN) 2 % Place 1 application into the nose daily.  . OMarland KitchenANZapine (ZYPREXA) 7.5 MG tablet Take 1 tablet (7.5 mg total) by mouth at bedtime.  . oMarland Kitcheneprazole (PRILOSEC) 20 MG capsule Take 20 mg by mouth daily before breakfast.   . tamsulosin (FLOMAX) 0.4 MG CAPS capsule Take 0.4 mg by mouth daily.  . tMarland Kitcheniamcinolone (NASACORT ALLERGY 24HR) 55 MCG/ACT AERO nasal inhaler Place 2 sprays into the nose daily as needed (allergies).  . vitamin B-12 (CYANOCOBALAMIN) 1000 MCG tablet Take 1,000 mcg by mouth daily.   No facility-administered encounter medications on file as of 06/09/2019.    Review of Systems  Constitutional: Positive for chills. Negative for fever.  HENT: Negative for congestion, rhinorrhea, sinus pressure, sinus pain, sneezing and sore throat.   Respiratory: Negative for cough, chest tightness, shortness of breath and wheezing.   Cardiovascular: Negative for chest pain, palpitations  and leg swelling.  Gastrointestinal: Positive for diarrhea. Negative for abdominal distention, blood in stool, constipation, nausea, rectal pain and vomiting.       Abdominal pain this morning which went away after using the bathroom.   Musculoskeletal: Negative for myalgias.  Skin: Negative for color change, pallor and rash.  Neurological: Negative for dizziness, light-headedness and headaches.    Immunization History  Administered Date(s) Administered  . Fluad Quad(high Dose 65+) 11/15/2018  . Influenza, High Dose Seasonal PF 11/26/2016  . Influenza,inj,Quad PF,6+ Mos 11/13/2014, 01/03/2016  . Influenza-Unspecified 10/25/2012, 11/24/2016, 11/24/2017  . PPD Test 12/30/2010, 01/05/2012, 01/07/2013, 01/13/2014  . Pneumococcal Conjugate-13 08/10/2014  . Pneumococcal Polysaccharide-23 03/05/2004, 01/03/2016, 05/06/2018  . Tdap 05/26/2011   Pertinent  Health Maintenance Due  Topic Date Due  . INFLUENZA VACCINE  09/25/2019  .  COLONOSCOPY  09/17/2027  . PNA vac Low Risk Adult  Completed   Fall Risk  06/09/2019 05/06/2019 03/25/2019 03/16/2019 02/28/2019  Falls in the past year? 0 0 0 0 0  Number falls in past yr: 0 0 0 - 0  Injury with Fall? 0 0 0 - 0  Risk for fall due to : - - - Impaired balance/gait;Other (Comment) -  Risk for fall due to: Comment - - - Single leg stand less than 5 seconds. -  Follow up - - - Falls evaluation completed -    There were no vitals filed for this visit. There is no height or weight on file to calculate BMI. Physical Exam Unable to complete on Telephone visit.   Labs reviewed: Recent Labs    09/01/18 0608 09/01/18 0608 09/02/18 0514 09/07/18 1708 09/08/18 0248 09/09/18 0443 04/25/19 0600 04/25/19 0600 04/26/19 0524 04/26/19 0803 05/09/19 0912  NA 138   < > 139   < > 139   < > 145   < > 144 146*  147* 143  K 4.5   < > 4.0   < > 3.9   < > 3.8   < > 3.5 3.3*  3.2* 5.0  CL 111   < > 110   < > 113*   < > 114*  --  114*  --  105  CO2 15*   < > 15*   < > 16*   < > 23  --  22  --  27  GLUCOSE 100*   < > 84   < > 95   < > 84  --  82  --  90  BUN 51*   < > 67*   < > 57*   < > 27*  --  21  --  21  CREATININE 2.42*   < > 2.46*   < > 2.44*   < > 2.15*  --  1.65*  --  2.25*  CALCIUM 7.3*   < > 7.6*   < > 9.0   < > 8.3*  --  8.1*  --  8.6*  MG 1.0*  --  1.0*  --  1.6*  --   --   --   --   --   --    < > = values in this interval not displayed.   Recent Labs    09/02/18 0514 09/08/18 0248 09/09/18 0443  AST 21 13* 12*  ALT 20 11 10   ALKPHOS 70 61 50  BILITOT 0.2* 0.5 0.4  PROT 7.6 6.7 6.3*  ALBUMIN 3.6 3.0* 2.7*   Recent Labs    09/08/18 0248  10/07/18 1008 12/30/18 0000 01/07/19 0955 01/18/19 1231 02/07/19 0932 04/19/19 1041 04/19/19 1041 04/26/19 0524 04/26/19 0524 04/26/19 0803 05/02/19 1020 06/07/19 0958  WBC 9.0   < > 9.0  --  7.8  --  8.6  --  7.8  --   --   --   --   NEUTROABS 5.4  --  6  --   --   --   --   --  3.7  --   --   --   --   HGB 10.7*   <  > 10.5*   < > 10.8*   < > 11.2*   < > 10.2*   < > 10.9*  10.9* 11.8* 11.5*  HCT 33.6*  32.0*   < > 31*   < > 34.6*   < > 36.9*   < > 32.3*   < > 32.0*  32.0* 38.1* 37.5*  MCV 104.7*   < >  --   --  107.8*  --  107.9*  --  104.2*  --   --   --   --   PLT 201   < > 169  --  176  --  149*  --  141*  --   --   --   --    < > = values in this interval not displayed.   Lab Results  Component Value Date   TSH 0.744 09/08/2018   Lab Results  Component Value Date   HGBA1C 4.9 08/10/2015   Lab Results  Component Value Date   CHOL 140 04/19/2019   HDL 54 04/19/2019   LDLCALC 64 04/19/2019   TRIG 110 04/19/2019   CHOLHDL 2.6 04/19/2019    Significant Diagnostic Results in last 30 days:  No results found.  Assessment/Plan Diarrhea Had chills yesterday but none today. Had 4-5 loose stool but has slowed down today.Abdominal pain relieved after using the bathroom.suspect possible chron's disease related or could be from ingested food.since symptom have improved will have him continue on Loperamide 2 mg capsule four times daily as needed. - Encouraged to increase fluid intake  - Advance diet as tolerated  - Notify provider or go to ED if symptoms worsen or fail to improve.   Family/ staff Communication: Reviewed plan of care with patient  Labs/tests ordered: None   Next Appointment: as needed if symptoms worsen or fail to improve.  I connected with  [name] on 06/09/19 by a Telephone enabled telemedicine application and verified that I am speaking with the correct person using two identifiers.   I discussed the limitations of evaluation and management by telemedicine. The patient expressed understanding and agreed to proceed.  Spent 11 minutes of non-face to face with patient   Sandrea Hughs, NP

## 2019-06-09 NOTE — Patient Instructions (Signed)
- continue on Loperamide 2 mg capsule four times daily as needed. - Increase fluid intake  - Advance diet as tolerated  - Notify provider or go to ED if symptoms worsen or fail to improve.  Diarrhea, Adult Diarrhea is when you pass loose and watery poop (stool) often. Diarrhea can make you feel weak and cause you to lose water in your body (get dehydrated). Losing water in your body can cause you to:  Feel tired and thirsty.  Have a dry mouth.  Go pee (urinate) less often. Diarrhea often lasts 2-3 days. However, it can last longer if it is a sign of something more serious. It is important to treat your diarrhea as told by your doctor. Follow these instructions at home: Eating and drinking     Follow these instructions as told by your doctor:  Take an ORS (oral rehydration solution). This is a drink that helps you replace fluids and minerals your body lost. It is sold at pharmacies and stores.  Drink plenty of fluids, such as: ? Water. ? Ice chips. ? Diluted fruit juice. ? Low-calorie sports drinks. ? Milk, if you want.  Avoid drinking fluids that have a lot of sugar or caffeine in them.  Eat bland, easy-to-digest foods in small amounts as you are able. These foods include: ? Bananas. ? Applesauce. ? Rice. ? Low-fat (lean) meats. ? Toast. ? Crackers.  Avoid alcohol.  Avoid spicy or fatty foods.  Medicines  Take over-the-counter and prescription medicines only as told by your doctor.  If you were prescribed an antibiotic medicine, take it as told by your doctor. Do not stop using the antibiotic even if you start to feel better. General instructions   Wash your hands often using soap and water. If soap and water are not available, use a hand sanitizer. Others in your home should wash their hands as well. Hands should be washed: ? After using the toilet or changing a diaper. ? Before preparing, cooking, or serving food. ? While caring for a sick person. ? While  visiting someone in a hospital.  Drink enough fluid to keep your pee (urine) pale yellow.  Rest at home while you get better.  Watch your condition for any changes.  Take a warm bath to help with any burning or pain from having diarrhea.  Keep all follow-up visits as told by your doctor. This is important. Contact a doctor if:  You have a fever.  Your diarrhea gets worse.  You have new symptoms.  You cannot keep fluids down.  You feel light-headed or dizzy.  You have a headache.  You have muscle cramps. Get help right away if:  You have chest pain.  You feel very weak or you pass out (faint).  You have bloody or black poop or poop that looks like tar.  You have very bad pain, cramping, or bloating in your belly (abdomen).  You have trouble breathing or you are breathing very quickly.  Your heart is beating very quickly.  Your skin feels cold and clammy.  You feel confused.  You have signs of losing too much water in your body, such as: ? Dark pee, very little pee, or no pee. ? Cracked lips. ? Dry mouth. ? Sunken eyes. ? Sleepiness. ? Weakness. Summary  Diarrhea is when you pass loose and watery poop (stool) often.  Diarrhea can make you feel weak and cause you to lose water in your body (get dehydrated).  Take an ORS (  oral rehydration solution). This is a drink that is sold at pharmacies and stores.  Eat bland, easy-to-digest foods in small amounts as you are able.  Contact a doctor if your condition gets worse. Get help right away if you have signs that you have lost too much water in your body. This information is not intended to replace advice given to you by your health care provider. Make sure you discuss any questions you have with your health care provider. Document Revised: 07/17/2017 Document Reviewed: 07/17/2017 Elsevier Patient Education  [place].

## 2019-06-09 NOTE — Telephone Encounter (Signed)
[name] are scheduled for a virtual visit with your provider today.    Just as we do with appointments in the office, we must obtain your consent to participate.  Your consent will be active for this visit and any virtual visit you may have with one of our providers in the next 365 days.    If you have a MyChart account, I can also send a copy of this consent to you electronically.  All virtual visits are billed to your insurance company just like a traditional visit in the office.  As this is a virtual visit, video technology does not allow for your provider to perform a traditional examination.  This may limit your provider's ability to fully assess your condition.  If your provider identifies any concerns that need to be evaluated in person or the need to arrange testing such as labs, EKG, etc, we will make arrangements to do so.    Although advances in technology are sophisticated, we cannot ensure that it will always work on either your end or our end.  If the connection with a video visit is poor, we may have to switch to a telephone visit.  With either a video or telephone visit, we are not always able to ensure that we have a secure connection.   I need to obtain your verbal consent now.   Are you willing to proceed with your visit today?   Brent [name] has provided verbal consent on 06/09/2019 for a virtual visit (video or telephone).   [name] [place], [place] 06/09/2019  1:24 PM

## 2019-06-14 DIAGNOSIS — R350 Frequency of micturition: Secondary | ICD-10-CM | POA: Diagnosis not present

## 2019-06-14 DIAGNOSIS — N3281 Overactive bladder: Secondary | ICD-10-CM | POA: Diagnosis not present

## 2019-06-15 ENCOUNTER — Other Ambulatory Visit (HOSPITAL_COMMUNITY): Payer: Self-pay

## 2019-06-15 NOTE — Progress Notes (Signed)
Paramedicine Encounter    Patient ID: Brent [name], male    DOB: [birth year], 74 y.o.   MRN: [id number]   Patient Care Team: Gayland Curry, DO as PCP - General (Geriatric Medicine) [place]ique, Peter M, MD as PCP - Cardiology (Cardiology) Larey Dresser, MD as PCP - Advanced Heart Failure (Cardiology) Elmarie Shiley, MD (Nephrology) Milus Banister, MD (Gastroenterology)  Patient Active Problem List   Diagnosis Date Noted  . Acute on chronic systolic (congestive) heart failure ([place]) 04/25/2019  . Metabolic acidosis 85/63/1497  . Orthostasis 09/07/2018  . AKI (acute kidney injury) ([place])   . Immunosuppressed status ([place])   . Macrocytic anemia   . Chronic combined systolic and diastolic congestive heart failure ([place]) 06/03/2018  . Candida esophagitis ([place]) 09/22/2017  . History of smoking 30 or more pack years 01/05/2017  . Bipolar 1 disorder ([place])   . BPH (benign prostatic hyperplasia) 03/31/2013  . 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 Essential hypertension, benign 03/31/2013  . Acute renal failure superimposed on stage 3 chronic kidney disease ([place]) 06/04/2012  . 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) 01/23/2010    Current Outpatient Medications:  .  allopurinol (ZYLOPRIM) 100 MG tablet, Take 2 tablets (200 mg total) by mouth daily., Disp: 60 tablet, Rfl: 5 .  aspirin EC 81 MG tablet, Take 1 tablet (81 mg total) by mouth daily., Disp: 90 tablet, Rfl: 3 .  Cholecalciferol (VITAMIN D) 125 MCG (5000 UT) CAPS, Take 5,000 Units by mouth daily. , Disp: , Rfl:  .  divalproex (DEPAKOTE ER) 500 MG 24 hr tablet, Take 1,500 mg by mouth at bedtime., Disp: , Rfl:  .  epoetin alfa (PROCRIT) 02637 UNIT/ML injection, Inject 10,000 Units into the skin every 30 (thirty) days. , Disp: , Rfl:  .  ferrous sulfate 325 (65 FE) MG tablet, Take 325 mg by mouth every Monday, Wednesday, and Friday. , Disp: , Rfl:  .  hydrALAZINE (APRESOLINE) 25 MG tablet, Take 1 tablet (25 mg total) by mouth 2 (two) times  daily., Disp: 60 tablet, Rfl: 3 .  inFLIXimab (REMICADE) 100 MG injection, Infuse Remicade IV schedule 1 48m/kg every 8 weeks Premedicate with Tylenol 500-6563mby mouth and Benadryl 25-5079my mouth prior to infusion. Last PPD was on 12/2009. , Disp: 1 each, Rfl: 6 .  isosorbide mononitrate (IMDUR) 30 MG 24 hr tablet, Take 1 tablet (30 mg total) by mouth daily., Disp: 90 tablet, Rfl: 3 .  ivabradine (CORLANOR) 5 MG TABS tablet, Take 1 tablet (5 mg total) by mouth 2 (two) times daily with a meal., Disp: 180 tablet, Rfl: 3 .  loperamide (IMODIUM) 2 MG capsule, Take 1 capsule (2 mg total) by mouth 4 (four) times daily as needed for diarrhea or loose stools., Disp: 12 capsule, Rfl: 0 .  magnesium oxide (MAG-OX) 400 MG tablet, Take 800 mg by mouth in the morning, at noon, and at bedtime. , Disp: , Rfl:  .  metoprolol succinate (TOPROL-XL) 25 MG 24 hr tablet, Take 1 tablet (25 mg total) by mouth at bedtime., Disp: 90 tablet, Rfl: 3 .  OLANZapine (ZYPREXA) 7.5 MG tablet, Take 1 tablet (7.5 mg total) by mouth at bedtime., Disp: 30 tablet, Rfl: 11 .  omeprazole (PRILOSEC) 20 MG capsule, Take 20 mg by mouth daily before breakfast. , Disp: , Rfl:  .  tamsulosin (FLOMAX) 0.4 MG CAPS capsule, Take 0.4 mg by mouth daily., Disp: , Rfl:  .  vitamin B-12 (CYANOCOBALAMIN) 1000 MCG tablet, Take  1,000 mcg by mouth daily., Disp: , Rfl:  .  mupirocin ointment (BACTROBAN) 2 %, Place 1 application into the nose daily., Disp: , Rfl:  .  triamcinolone (NASACORT ALLERGY 24HR) 55 MCG/ACT AERO nasal inhaler, Place 2 sprays into the nose daily as needed (allergies)., Disp: , Rfl:  Allergies  Allergen Reactions  . Azathioprine Other (See Comments)    REACTION: affected WBC "Almost died"  . Ciprofloxacin Other (See Comments)    Unknown rxn  . Levaquin [Levofloxacin In D5w] Other (See Comments)    Unknown rxn  . Plendil [Felodipine] Other (See Comments)    Unknown rxn     Social History   Socioeconomic History  .  Marital status: Divorced    Spouse name: Not on file  . Number of children: 1  . Years of education: 30  . Highest education level: Not on file  Occupational History  . Occupation: retired  . Occupation: Veteran  Tobacco Use  . Smoking status: Former Smoker    Packs/day: 1.00    Years: 49.00    Pack years: 49.00    Types: Cigarettes    Start date: 04/11/1956    Quit date: 04/17/2014    Years since quitting: 5.1  . Smokeless tobacco: Never Used  Substance and Sexual Activity  . Alcohol use: No    Alcohol/week: 0.0 standard drinks  . Drug use: No  . Sexual activity: Never  Other Topics Concern  . Not on file  Social History Narrative  . Not on file   Social Determinants of Health   Financial Resource Strain:   . Difficulty of Paying Living Expenses:   Food Insecurity:   . Worried About Charity fundraiser in the Last Year:   . Arboriculturist in the Last Year:   Transportation Needs:   . Film/video editor (Medical):   Marland Kitchen Lack of Transportation (Non-Medical):   Physical Activity:   . Days of Exercise per Week:   . Minutes of Exercise per Session:   Stress:   . Feeling of Stress :   Social Connections:   . Frequency of Communication with Friends and Family:   . Frequency of Social Gatherings with Friends and Family:   . Attends Religious Services:   . Active Member of Clubs or Organizations:   . Attends Archivist Meetings:   Marland Kitchen Marital Status:   Intimate Partner Violence:   . Fear of Current or Ex-Partner:   . Emotionally Abused:   Marland Kitchen Physically Abused:   . Sexually Abused:     Physical Exam Vitals reviewed.  Constitutional:      Appearance: Normal appearance. He is normal weight.  HENT:     Head: Normocephalic.     Nose: Nose normal.     Mouth/Throat:     Mouth: Mucous membranes are moist.     Pharynx: Oropharynx is clear.  Eyes:     Pupils: Pupils are equal, round, and reactive to light.  Cardiovascular:     Rate and Rhythm: Normal rate  and regular rhythm.     Pulses: Normal pulses.     Heart sounds: Normal heart sounds.  Pulmonary:     Effort: Pulmonary effort is normal.     Breath sounds: Normal breath sounds.  Abdominal:     General: Abdomen is flat.     Palpations: Abdomen is soft.  Musculoskeletal:        General: Normal range of motion.     Cervical  back: Normal range of motion.     Right lower leg: No edema.     Left lower leg: No edema.  Skin:    General: Skin is warm and dry.     Capillary Refill: Capillary refill takes less than 2 seconds.  Neurological:     General: No focal deficit present.     Mental Status: He is alert.  Psychiatric:        Mood and Affect: Mood normal.      Arrived for home visit for Brent [name] who was alert and oriented with no complaints stating he is feeling much better than last week after having a lot of fatigue following his 2nd vaccine. Vitals were obtained and are as noted. No edema, no JVD, lung sounds clear. Patient denied trouble sleeping, breathing or walking. Medications were reviewed and confirmed. Pill box filled accordingly. Home visit complete I will see patient in one week.   Refills: Tamulosin   Weight- 173lbs     Future Appointments  Date Time Provider [place]  06/20/2019 11:30 AM Hollace Kinnier L, DO [facility] None  07/07/2019 10:00 AM [facility] None  08/16/2019 12:00 PM Larey Dresser, MD [facility] None  01/20/2020  2:15 PM [name], [name], NP [facility] None     ACTION: Home visit completed Next visit planned for one week

## 2019-06-16 ENCOUNTER — Other Ambulatory Visit: Payer: Self-pay

## 2019-06-16 MED ORDER — TAMSULOSIN HCL 0.4 MG PO CAPS: 0.4000 mg | ORAL_CAPSULE | Freq: Every day | ORAL | 2 refills | Status: DC

## 2019-06-16 NOTE — Telephone Encounter (Signed)
Script sent to [facility]

## 2019-06-20 ENCOUNTER — Other Ambulatory Visit: Payer: Self-pay

## 2019-06-20 ENCOUNTER — Encounter: Payer: Self-pay | Admitting: Internal Medicine

## 2019-06-20 ENCOUNTER — Ambulatory Visit (INDEPENDENT_AMBULATORY_CARE_PROVIDER_SITE_OTHER): Payer: Medicare Other | Admitting: Internal Medicine

## 2019-06-20 ENCOUNTER — Telehealth: Payer: Self-pay

## 2019-06-20 VITALS — BP 142/82 | HR 61 | Temp 97.8°F | Ht 73.2 in | Wt 175.0 lb

## 2019-06-20 DIAGNOSIS — Z1159 Encounter for screening for other viral diseases: Secondary | ICD-10-CM

## 2019-06-20 DIAGNOSIS — B351 Tinea unguium: Secondary | ICD-10-CM | POA: Diagnosis not present

## 2019-06-20 DIAGNOSIS — N1832 Chronic kidney disease, stage 3b: Secondary | ICD-10-CM

## 2019-06-20 DIAGNOSIS — I5042 Chronic combined systolic (congestive) and diastolic (congestive) heart failure: Secondary | ICD-10-CM

## 2019-06-20 DIAGNOSIS — J449 Chronic obstructive pulmonary disease, unspecified: Secondary | ICD-10-CM

## 2019-06-20 DIAGNOSIS — K5000 Crohn's disease of small intestine without complications: Secondary | ICD-10-CM

## 2019-06-20 DIAGNOSIS — I10 Essential (primary) hypertension: Secondary | ICD-10-CM

## 2019-06-20 DIAGNOSIS — I472 Ventricular tachycardia, unspecified: Secondary | ICD-10-CM

## 2019-06-20 DIAGNOSIS — F319 Bipolar disorder, unspecified: Secondary | ICD-10-CM

## 2019-06-20 DIAGNOSIS — I739 Peripheral vascular disease, unspecified: Secondary | ICD-10-CM | POA: Diagnosis not present

## 2019-06-20 DIAGNOSIS — L97521 Non-pressure chronic ulcer of other part of left foot limited to breakdown of skin: Secondary | ICD-10-CM | POA: Diagnosis not present

## 2019-06-20 DIAGNOSIS — D631 Anemia in chronic kidney disease: Secondary | ICD-10-CM

## 2019-06-20 NOTE — Telephone Encounter (Signed)
Patient called to verify that his appointment was for today

## 2019-06-20 NOTE — Progress Notes (Signed)
Location:  [facility] clinic Provider:  Berley Gambrell L. Mariea Clonts, D.O., C.M.D.  Code Status: We had discussed DNR in the past, but pt then said full code when met with NP Sch[place] in his home and MOST form was done--he agrees with this today.    Goals of Care:  Advanced Directives 06/20/2019  Does Patient Have a Medical Advance Directive? Yes  Type of Advance Directive Out of facility DNR (pink MOST or yellow form)  Does patient want to make changes to medical advance directive? No - Patient declined  Copy of [place] in Chart? -  Would patient like information on creating a medical advance directive? -  Pre-existing out of facility DNR order (yellow form or pink MOST form) Pink MOST/Yellow Form most recent copy in chart - Physician notified to receive inpatient order   Chief Complaint  Patient presents with  . Medical Management of Chronic Issues    3 month follow up     HPI: Patient is a 74 y.o. male seen today for medical management of chronic diseases.    He was really having difficulty with diarrhea with his Crohn's regional enteritis.  He'd been having chills and weakness with it.  He's ok now.  He took his diarrhea pills.  BMs  Are back to a light color.  They had been black flare-up.  He'd lost a few lbs again and gained back now.  He has completed his rehab.    Breathing doing fine.  If overworks himself, he gets winded.    Swallowing well and no more pain or difficulty there after recovered from his candida infection.  He has a friend who is a Biomedical scientist that lives where he does.  He might review his ACP with him.  Has MOST for full treatment now completed by NP in his home.  3/24 was last covid vaccine--not sure which one.  Nothing new happening.  He has to be careful what he eats b/c of his diarrhea--reminded him that [employer] is also not good for his chf.    A nurse is filling his pillbox, checking his bp and weight.    He still gets dizzy when he stands.  He is  trying to do better with hydration.  He gets up at night and drinks some.  He's not having any trouble urinating.    He wakes up in the middle of the night, watches [employer] [place] [employer], then goes back to sleep.    Labs reviewed from march and early this month.   Past Medical History:  Diagnosis Date  . Anemia   . Anxiety   . Benign paroxysmal positional vertigo   . Bipolar I disorder, most recent episode (or current) unspecified   . Celiac disease   . CHF (congestive heart failure) ([place])   . Chronic kidney disease, stage III (moderate)   . Crohn's    Remicade q8 weeks  . Depression   . Essential and other specified forms of tremor    medication-induced Parkinson's, now resolved  . Essential hypertension, benign   . Gout 2018  . Heart failure ([place])   . Hypertrophy of prostate without urinary obstruction and other lower urinary tract symptoms (LUTS)   . Impotence of organic origin   . Insomnia with sleep apnea, unspecified   . Iron deficiency anemia, unspecified   . 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 Neuralgia, neuritis, and radiculitis, unspecified   . Other B-complex deficiencies   . Other extrapyramidal disease and abnormal movement  disorder   . Postinflammatory pulmonary fibrosis ([place])   . Tobacco use disorder   . Vertigo 2018    Past Surgical History:  Procedure Laterality Date  . CATARACT EXTRACTION, BILATERAL Bilateral 09/2018  . CHOLECYSTECTOMY  07-12-2010  . RIGHT HEART CATH N/A 10/12/2018   Procedure: RIGHT HEART CATH;  Surgeon: Jolaine Artist, MD;  Location: [place] [facility];  Service: Cardiovascular;  Laterality: N/A;  . RIGHT/LEFT HEART CATH AND CORONARY ANGIOGRAPHY N/A 04/26/2019   Procedure: RIGHT/LEFT HEART CATH AND CORONARY ANGIOGRAPHY;  Surgeon: Larey Dresser, MD;  Location: [place] [facility];  Service: Cardiovascular;  Laterality: N/A;  . SMALL INTESTINE SURGERY     x 2    Allergies  Allergen Reactions  . Azathioprine Other (See Comments)    REACTION:  affected WBC "Almost died"  . Ciprofloxacin Other (See Comments)    Unknown rxn  . Levaquin [Levofloxacin In D5w] Other (See Comments)    Unknown rxn  . Plendil [Felodipine] Other (See Comments)    Unknown rxn    Outpatient Encounter Medications as of 06/20/2019  Medication Sig  . allopurinol (ZYLOPRIM) 100 MG tablet Take 2 tablets (200 mg total) by mouth daily.  Marland Kitchen aspirin EC 81 MG tablet Take 1 tablet (81 mg total) by mouth daily.  . Cholecalciferol (VITAMIN D) 125 MCG (5000 UT) CAPS Take 5,000 Units by mouth daily.   . divalproex (DEPAKOTE ER) 500 MG 24 hr tablet Take 1,500 mg by mouth at bedtime.  Marland Kitchen epoetin alfa (PROCRIT) 11031 UNIT/ML injection Inject 10,000 Units into the skin every 30 (thirty) days.   . ferrous sulfate 325 (65 FE) MG tablet Take 325 mg by mouth every Monday, Wednesday, and Friday.   . hydrALAZINE (APRESOLINE) 25 MG tablet Take 1 tablet (25 mg total) by mouth 2 (two) times daily.  Marland Kitchen inFLIXimab (REMICADE) 100 MG injection Infuse Remicade IV schedule 1 83m/kg every 8 weeks Premedicate with Tylenol 500-6512mby mouth and Benadryl 25-5024my mouth prior to infusion. Last PPD was on 12/2009.   . isosorbide mononitrate (IMDUR) 30 MG 24 hr tablet Take 1 tablet (30 mg total) by mouth daily.  . ivabradine (CORLANOR) 5 MG TABS tablet Take 1 tablet (5 mg total) by mouth 2 (two) times daily with a meal.  . loperamide (IMODIUM) 2 MG capsule Take 1 capsule (2 mg total) by mouth 4 (four) times daily as needed for diarrhea or loose stools.  . magnesium oxide (MAG-OX) 400 MG tablet Take 800 mg by mouth in the morning, at noon, and at bedtime.   . metoprolol succinate (TOPROL-XL) 25 MG 24 hr tablet Take 1 tablet (25 mg total) by mouth at bedtime.  . mupirocin ointment (BACTROBAN) 2 % Place 1 application into the nose daily.  . OMarland KitchenANZapine (ZYPREXA) 7.5 MG tablet Take 1 tablet (7.5 mg total) by mouth at bedtime.  . oMarland Kitcheneprazole (PRILOSEC) 20 MG capsule Take 20 mg by mouth daily before  breakfast.   . tamsulosin (FLOMAX) 0.4 MG CAPS capsule Take 1 capsule (0.4 mg total) by mouth daily.  . tMarland Kitcheniamcinolone (NASACORT ALLERGY 24HR) 55 MCG/ACT AERO nasal inhaler Place 2 sprays into the nose daily as needed (allergies).  . vitamin B-12 (CYANOCOBALAMIN) 1000 MCG tablet Take 1,000 mcg by mouth daily.   No facility-administered encounter medications on file as of 06/20/2019.    Review of Systems:  Review of Systems  Constitutional: Negative for chills, fever, malaise/fatigue and weight loss.  HENT: Positive for hearing loss. Negative for  congestion and sore throat.   Eyes: Negative for blurred vision.       Glasses  Respiratory: Positive for shortness of breath. Negative for cough and wheezing.   Cardiovascular: Negative for chest pain, palpitations and leg swelling.  Gastrointestinal: Negative for abdominal pain, blood in stool, constipation, diarrhea and melena.       Bowels back to baseline  Genitourinary: Negative for dysuria.  Musculoskeletal: Negative for falls and joint pain.  Neurological: Negative for dizziness and loss of consciousness.  Endo/Heme/Allergies: Bruises/bleeds easily.  Psychiatric/Behavioral: Negative for depression. The patient is not nervous/anxious and does not have insomnia.     Health Maintenance  Topic Date Due  . Hepatitis C Screening  Never done  . COVID-19 Vaccine (1) Never done  . INFLUENZA VACCINE  09/25/2019  . TETANUS/TDAP  05/25/2021  . COLONOSCOPY  09/17/2027  . PNA vac Low Risk Adult  Completed    Physical Exam: Vitals:   06/20/19 1134  BP: (!) 142/82  Pulse: 61  Temp: 97.8 F (36.6 C)  TempSrc: Temporal  SpO2: 92%  Weight: 175 lb (79.4 kg)  Height: 6' 1.2" (1.859 m)   Body mass index is 22.96 kg/m. Physical Exam Vitals reviewed.  Constitutional:      General: He is not in acute distress.    Appearance: Normal appearance. He is not toxic-appearing.     Comments: Tall thin male  HENT:     Head: Normocephalic and  atraumatic.  Eyes:     Comments: glasses  Cardiovascular:     Rate and Rhythm: Normal rate and regular rhythm.     Pulses: Normal pulses.     Heart sounds: Normal heart sounds.  Pulmonary:     Effort: Pulmonary effort is normal.     Breath sounds: Normal breath sounds. No rales.  Abdominal:     General: Bowel sounds are normal.  Musculoskeletal:        General: Normal range of motion.     Right lower leg: No edema.     Left lower leg: No edema.  Skin:    General: Skin is warm and dry.  Neurological:     General: No focal deficit present.     Mental Status: He is alert and oriented to person, place, and time.     Cranial Nerves: No cranial nerve deficit.     Motor: No weakness.     Gait: Gait normal.  Psychiatric:        Mood and Affect: Mood normal.        Behavior: Behavior normal.     Labs reviewed: Basic Metabolic Panel: Recent Labs    09/01/18 0608 09/01/18 0608 09/02/18 0514 09/07/18 1708 09/08/18 0248 09/09/18 0443 04/25/19 0600 04/25/19 0600 04/26/19 0524 04/26/19 0803 05/09/19 0912  NA 138   < > 139   < > 139   < > 145   < > 144 146*  147* 143  K 4.5   < > 4.0   < > 3.9   < > 3.8   < > 3.5 3.3*  3.2* 5.0  CL 111   < > 110   < > 113*   < > 114*  --  114*  --  105  CO2 15*   < > 15*   < > 16*   < > 23  --  22  --  27  GLUCOSE 100*   < > 84   < > 95   < > 84  --  82  --  90  BUN 51*   < > 67*   < > 57*   < > 27*  --  21  --  21  CREATININE 2.42*   < > 2.46*   < > 2.44*   < > 2.15*  --  1.65*  --  2.25*  CALCIUM 7.3*   < > 7.6*   < > 9.0   < > 8.3*  --  8.1*  --  8.6*  MG 1.0*  --  1.0*  --  1.6*  --   --   --   --   --   --   TSH 0.379  --   --   --  0.744  --   --   --   --   --   --    < > = values in this interval not displayed.   Liver Function Tests: Recent Labs    09/02/18 0514 09/08/18 0248 09/09/18 0443  AST 21 13* 12*  ALT 20 11 10   ALKPHOS 70 61 50  BILITOT 0.2* 0.5 0.4  PROT 7.6 6.7 6.3*  ALBUMIN 3.6 3.0* 2.7*   No results for  input(s): LIPASE, AMYLASE in the last 8760 hours. No results for input(s): AMMONIA in the last 8760 hours. CBC: Recent Labs    09/08/18 0248 10/07/18 1008 12/30/18 0000 01/07/19 0955 01/18/19 1231 02/07/19 0932 04/19/19 1041 04/19/19 1041 04/26/19 0524 04/26/19 0524 04/26/19 0803 05/02/19 1020 06/07/19 0958  WBC 9.0   < > 9.0  --  7.8  --  8.6  --  7.8  --   --   --   --   NEUTROABS 5.4  --  6  --   --   --   --   --  3.7  --   --   --   --   HGB 10.7*   < > 10.5*   < > 10.8*   < > 11.2*   < > 10.2*   < > 10.9*  10.9* 11.8* 11.5*  HCT 33.6*  32.0*   < > 31*   < > 34.6*   < > 36.9*   < > 32.3*   < > 32.0*  32.0* 38.1* 37.5*  MCV 104.7*   < >  --   --  107.8*  --  107.9*  --  104.2*  --   --   --   --   PLT 201   < > 169  --  176  --  149*  --  141*  --   --   --   --    < > = values in this interval not displayed.   Lipid Panel: Recent Labs    04/19/19 1041  CHOL 140  HDL 54  LDLCALC 64  TRIG 110  CHOLHDL 2.6   Lab Results  Component Value Date   HGBA1C 4.9 08/10/2015    Assessment/Plan 1. Chronic combined systolic and diastolic congestive heart failure (HCC) - improved, but does maintain some dyspnea on exertion -appears euvolemic though at present - Lipid panel; Future  2. Crohn's disease of small intestine without complication ([place]) -had exacerbation but this has resolved now  3. Anemia in stage 3b chronic kidney disease - gets procrit injections and monitored by Dr. Posey Pronto, nephrology - CBC with Differential/Platelet; Future  4. Bipolar 1 disorder (HCC) -stable, taking meds as directed--says he has no idea why he didn't that one time and  that it will never happen again  5. Essential hypertension, benign -bp is well controlled -must hydrate adequately and eat consistently to prevent orthostasis and change positions slowly - CBC with Differential/Platelet; Future - COMPLETE METABOLIC PANEL WITH GFR; Future - Lipid panel; Future  6. Chronic  obstructive pulmonary disease, unspecified COPD type ([place]) -not on tx for this, but was noted on imaging   7. Ventricular tachycardia ([place]) -history of this, has full code status at this time   8. Encounter for hepatitis C screening test for low risk patient - Hepatitis C antibody; Future  Labs/tests ordered:   Lab Orders     CBC with Differential/Platelet     COMPLETE METABOLIC PANEL WITH GFR     Hepatitis C antibody     Lipid panel  Next appt:  10/20/2019--fasting labs same day    Keaundra Stehle L. Jmya Uliano, D.O. [facility] [address], [place] [address] Cell Phone (Mon-Fri 8am-5pm):  [phone] On Call:  [phone] & follow prompts after 5pm & weekends Office Phone:  [phone] Office Fax:  [phone]

## 2019-06-20 NOTE — Patient Instructions (Signed)
Please call us back with with covid vaccine you got.

## 2019-06-22 ENCOUNTER — Other Ambulatory Visit (HOSPITAL_COMMUNITY): Payer: Self-pay | Admitting: *Deleted

## 2019-06-22 ENCOUNTER — Other Ambulatory Visit (HOSPITAL_COMMUNITY): Payer: Self-pay

## 2019-06-22 MED ORDER — IVABRADINE HCL 5 MG PO TABS: 5.0000 mg | ORAL_TABLET | Freq: Two times a day (BID) | ORAL | 3 refills | Status: DC

## 2019-06-22 NOTE — Progress Notes (Signed)
Paramedicine Encounter    Patient ID: Brent [name], male    DOB: January 28, 1946, 74 y.o.   MRN: [id number]   Patient Care Team: Gayland Curry, DO as PCP - General (Geriatric Medicine) Martinique, Peter M, MD as PCP - Cardiology (Cardiology) Larey Dresser, MD as PCP - Advanced Heart Failure (Cardiology) Elmarie Shiley, MD (Nephrology) Milus Banister, MD (Gastroenterology)  Patient Active Problem List   Diagnosis Date Noted  . Chronic obstructive pulmonary disease ([place]) 06/20/2019  . Ventricular tachycardia ([place]) 06/20/2019  . Acute on chronic systolic (congestive) heart failure ([place]) 04/25/2019  . Metabolic acidosis 41/28/7867  . Orthostasis 09/07/2018  . AKI (acute kidney injury) ([place])   . Immunosuppressed status ([place])   . Macrocytic anemia   . Chronic combined systolic and diastolic congestive heart failure ([place]) 06/03/2018  . 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 History of smoking 30 or more pack years 01/05/2017  . Bipolar 1 disorder ([place])   . BPH (benign prostatic hyperplasia) 03/31/2013  . 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 Essential hypertension, benign 03/31/2013  . Acute renal failure superimposed on stage 3 chronic kidney disease ([place]) 06/04/2012  . 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[place]) 01/23/2010    Current Outpatient Medications:  .  allopurinol (ZYLOPRIM) 100 MG tablet, Take 2 tablets (200 mg total) by mouth daily., Disp: 60 tablet, Rfl: 5 .  aspirin EC 81 MG tablet, Take 1 tablet (81 mg total) by mouth daily., Disp: 90 tablet, Rfl: 3 .  Cholecalciferol (VITAMIN D) 125 MCG (5000 UT) CAPS, Take 5,000 Units by mouth daily. , Disp: , Rfl:  .  divalproex (DEPAKOTE ER) 500 MG 24 hr tablet, Take 1,500 mg by mouth at bedtime., Disp: , Rfl:  .  epoetin alfa (PROCRIT) 67209 UNIT/ML injection, Inject 10,000 Units into the skin every 30 (thirty) days. , Disp: , Rfl:  .  ferrous sulfate 325 (65 FE) MG tablet, Take 325 mg by mouth every Monday, Wednesday, and Friday. , Disp:  , Rfl:  .  hydrALAZINE (APRESOLINE) 25 MG tablet, Take 1 tablet (25 mg total) by mouth 2 (two) times daily., Disp: 60 tablet, Rfl: 3 .  inFLIXimab (REMICADE) 100 MG injection, Infuse Remicade IV schedule 1 7m/kg every 8 weeks Premedicate with Tylenol 500-6562mby mouth and Benadryl 25-5073my mouth prior to infusion. Last PPD was on 12/2009. , Disp: 1 each, Rfl: 6 .  loperamide (IMODIUM) 2 MG capsule, Take 1 capsule (2 mg total) by mouth 4 (four) times daily as needed for diarrhea or loose stools., Disp: 12 capsule, Rfl: 0 .  magnesium oxide (MAG-OX) 400 MG tablet, Take 800 mg by mouth in the morning, at noon, and at bedtime. , Disp: , Rfl:  .  metoprolol succinate (TOPROL-XL) 25 MG 24 hr tablet, Take 1 tablet (25 mg total) by mouth at bedtime., Disp: 90 tablet, Rfl: 3 .  omeprazole (PRILOSEC) 20 MG capsule, Take 20 mg by mouth daily before breakfast. , Disp: , Rfl:  .  tamsulosin (FLOMAX) 0.4 MG CAPS capsule, Take 1 capsule (0.4 mg total) by mouth daily., Disp: 30 capsule, Rfl: 2 .  triamcinolone (NASACORT ALLERGY 24HR) 55 MCG/ACT AERO nasal inhaler, Place 2 sprays into the nose daily as needed (allergies)., Disp: , Rfl:  .  vitamin B-12 (CYANOCOBALAMIN) 1000 MCG tablet, Take 1,000 mcg by mouth daily., Disp: , Rfl:  .  isosorbide mononitrate (IMDUR) 30 MG 24 hr tablet, Take 1 tablet (30 mg total) by mouth daily., Disp: 90 tablet,  Rfl: 3 .  ivabradine (CORLANOR) 5 MG TABS tablet, Take 1 tablet (5 mg total) by mouth 2 (two) times daily with a meal., Disp: 180 tablet, Rfl: 3 .  mupirocin ointment (BACTROBAN) 2 %, Place 1 application into the nose daily., Disp: , Rfl:  .  OLANZapine (ZYPREXA) 7.5 MG tablet, Take 1 tablet (7.5 mg total) by mouth at bedtime., Disp: 30 tablet, Rfl: 11 Allergies  Allergen Reactions  . Azathioprine Other (See Comments)    REACTION: affected WBC "Almost died"  . Ciprofloxacin Other (See Comments)    Unknown rxn  . Levaquin [Levofloxacin In D5w] Other (See Comments)     Unknown rxn  . Plendil [Felodipine] Other (See Comments)    Unknown rxn     Social History   Socioeconomic History  . Marital status: Divorced    Spouse name: Not on file  . Number of children: 1  . Years of education: 50  . Highest education level: Not on file  Occupational History  . Occupation: retired  . Occupation: Veteran  Tobacco Use  . Smoking status: Former Smoker    Packs/day: 1.00    Years: 49.00    Pack years: 49.00    Types: Cigarettes    Start date: 04/11/1956    Quit date: 04/17/2014    Years since quitting: 5.1  . Smokeless tobacco: Never Used  Substance and Sexual Activity  . Alcohol use: No    Alcohol/week: 0.0 standard drinks  . Drug use: No  . Sexual activity: Never  Other Topics Concern  . Not on file  Social History Narrative  . Not on file   Social Determinants of Health   Financial Resource Strain:   . Difficulty of Paying Living Expenses:   Food Insecurity:   . Worried About Charity fundraiser in the Last Year:   . Arboriculturist in the Last Year:   Transportation Needs:   . Film/video editor (Medical):   Marland Kitchen Lack of Transportation (Non-Medical):   Physical Activity:   . Days of Exercise per Week:   . Minutes of Exercise per Session:   Stress:   . Feeling of Stress :   Social Connections:   . Frequency of Communication with Friends and Family:   . Frequency of Social Gatherings with Friends and Family:   . Attends Religious Services:   . Active Member of Clubs or Organizations:   . Attends Archivist Meetings:   Marland Kitchen Marital Status:   Intimate Partner Violence:   . Fear of Current or Ex-Partner:   . Emotionally Abused:   Marland Kitchen Physically Abused:   . Sexually Abused:     Physical Exam   Arrived for Brent [name] home visit where he was seated in his recliner alert and oriented watching TV stating he has been having some increased fatigue over the last several days, I informed his PCP Brent [name] about same. No further  changes were made as of today. Vitals were obtained and are as noted in report. Medications were reviewed and confirmed and pill box filled accordingly. Brent [name] had no further complaints or needs today. Home visit complete. I will see patient in one week.   Refills: Loperamide  (attempted to refill but stated it was canceled via automated system, I will reach out to [facility])    Weight- 174lbs    Future Appointments  Date Time Provider [place]  07/07/2019 10:00 AM [place] None  08/16/2019 12:00  PM Larey Dresser, MD [facility] None  10/20/2019 11:00 AM Gayland Curry, DO [facility] None  01/24/2020  2:15 PM [name], [name], NP [facility] None     ACTION: Home visit completed Next visit planned for one week

## 2019-06-29 ENCOUNTER — Other Ambulatory Visit (HOSPITAL_COMMUNITY): Payer: Self-pay

## 2019-06-29 ENCOUNTER — Other Ambulatory Visit (HOSPITAL_COMMUNITY): Payer: Self-pay | Admitting: *Deleted

## 2019-06-29 DIAGNOSIS — K5090 Crohn's disease, unspecified, without complications: Secondary | ICD-10-CM | POA: Diagnosis not present

## 2019-06-29 MED ORDER — IVABRADINE HCL 5 MG PO TABS: 5.0000 mg | ORAL_TABLET | Freq: Two times a day (BID) | ORAL | 3 refills | Status: DC

## 2019-06-29 NOTE — Progress Notes (Signed)
Paramedicine Encounter    Patient ID: Brent [name], male    DOB: 1945/11/13, 74 y.o.   MRN: [id number]   Patient Care Team: Gayland Curry, DO as PCP - General (Geriatric Medicine) Martinique, Peter M, MD as PCP - Cardiology (Cardiology) Larey Dresser, MD as PCP - Advanced Heart Failure (Cardiology) Elmarie Shiley, MD (Nephrology) Milus Banister, MD (Gastroenterology)  Patient Active Problem List   Diagnosis Date Noted  . Chronic obstructive pulmonary disease ([place]) 06/20/2019  . Ventricular tachycardia ([place]) 06/20/2019  . Acute on chronic systolic (congestive) heart failure ([place]) 04/25/2019  . Metabolic acidosis 46/56/8127  . Orthostasis 09/07/2018  . AKI (acute kidney injury) ([place])   . Immunosuppressed status ([place])   . Macrocytic anemia   . Chronic combined systolic and diastolic congestive heart failure ([place]) 06/03/2018  . Candida esophagitis ([place]) 09/22/2017  . History of smoking 30 or more pack years 01/05/2017  . Bipolar 1 disorder ([place])   . BPH (benign prostatic hyperplasia) 03/31/2013  . 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 Essential hypertension, benign 03/31/2013  . Acute renal failure superimposed on stage 3 chronic kidney disease ([place]) 06/04/2012  . 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[place]) 01/23/2010    Current Outpatient Medications:  .  allopurinol (ZYLOPRIM) 100 MG tablet, Take 2 tablets (200 mg total) by mouth daily., Disp: 60 tablet, Rfl: 5 .  aspirin EC 81 MG tablet, Take 1 tablet (81 mg total) by mouth daily., Disp: 90 tablet, Rfl: 3 .  Cholecalciferol (VITAMIN D) 125 MCG (5000 UT) CAPS, Take 5,000 Units by mouth daily. , Disp: , Rfl:  .  divalproex (DEPAKOTE ER) 500 MG 24 hr tablet, Take 1,500 mg by mouth at bedtime., Disp: , Rfl:  .  epoetin alfa (PROCRIT) 51700 UNIT/ML injection, Inject 10,000 Units into the skin every 30 (thirty) days. , Disp: , Rfl:  .  ferrous sulfate 325 (65 FE) MG tablet, Take 325 mg by mouth every Monday, Wednesday, and Friday. , Disp:  , Rfl:  .  hydrALAZINE (APRESOLINE) 25 MG tablet, Take 1 tablet (25 mg total) by mouth 2 (two) times daily., Disp: 60 tablet, Rfl: 3 .  inFLIXimab (REMICADE) 100 MG injection, Infuse Remicade IV schedule 1 23m/kg every 8 weeks Premedicate with Tylenol 500-6562mby mouth and Benadryl 25-5081my mouth prior to infusion. Last PPD was on 12/2009. , Disp: 1 each, Rfl: 6 .  isosorbide mononitrate (IMDUR) 30 MG 24 hr tablet, Take 1 tablet (30 mg total) by mouth daily., Disp: 90 tablet, Rfl: 3 .  magnesium oxide (MAG-OX) 400 MG tablet, Take 800 mg by mouth in the morning, at noon, and at bedtime. , Disp: , Rfl:  .  metoprolol succinate (TOPROL-XL) 25 MG 24 hr tablet, Take 1 tablet (25 mg total) by mouth at bedtime., Disp: 90 tablet, Rfl: 3 .  OLANZapine (ZYPREXA) 7.5 MG tablet, Take 1 tablet (7.5 mg total) by mouth at bedtime., Disp: 30 tablet, Rfl: 11 .  omeprazole (PRILOSEC) 20 MG capsule, Take 20 mg by mouth daily before breakfast. , Disp: , Rfl:  .  tamsulosin (FLOMAX) 0.4 MG CAPS capsule, Take 1 capsule (0.4 mg total) by mouth daily., Disp: 30 capsule, Rfl: 2 .  triamcinolone (NASACORT ALLERGY 24HR) 55 MCG/ACT AERO nasal inhaler, Place 2 sprays into the nose daily as needed (allergies)., Disp: , Rfl:  .  vitamin B-12 (CYANOCOBALAMIN) 1000 MCG tablet, Take 1,000 mcg by mouth daily., Disp: , Rfl:  .  ivabradine (CORLANOR) 5 MG TABS  tablet, Take 1 tablet (5 mg total) by mouth 2 (two) times daily with a meal., Disp: 180 tablet, Rfl: 3 .  loperamide (IMODIUM) 2 MG capsule, Take 1 capsule (2 mg total) by mouth 4 (four) times daily as needed for diarrhea or loose stools., Disp: 12 capsule, Rfl: 0 .  mupirocin ointment (BACTROBAN) 2 %, Place 1 application into the nose daily., Disp: , Rfl:  Allergies  Allergen Reactions  . Azathioprine Other (See Comments)    REACTION: affected WBC "Almost died"  . Ciprofloxacin Other (See Comments)    Unknown rxn  . Levaquin [Levofloxacin In D5w] Other (See Comments)     Unknown rxn  . Plendil [Felodipine] Other (See Comments)    Unknown rxn     Social History   Socioeconomic History  . Marital status: Divorced    Spouse name: Not on file  . Number of children: 1  . Years of education: 79  . Highest education level: Not on file  Occupational History  . Occupation: retired  . Occupation: Veteran  Tobacco Use  . Smoking status: Former Smoker    Packs/day: 1.00    Years: 49.00    Pack years: 49.00    Types: Cigarettes    Start date: 04/11/1956    Quit date: 04/17/2014    Years since quitting: 5.2  . Smokeless tobacco: Never Used  Substance and Sexual Activity  . Alcohol use: No    Alcohol/week: 0.0 standard drinks  . Drug use: No  . Sexual activity: Never  Other Topics Concern  . Not on file  Social History Narrative  . Not on file   Social Determinants of Health   Financial Resource Strain:   . Difficulty of Paying Living Expenses:   Food Insecurity:   . Worried About Charity fundraiser in the Last Year:   . Arboriculturist in the Last Year:   Transportation Needs:   . Film/video editor (Medical):   Marland Kitchen Lack of Transportation (Non-Medical):   Physical Activity:   . Days of Exercise per Week:   . Minutes of Exercise per Session:   Stress:   . Feeling of Stress :   Social Connections:   . Frequency of Communication with Friends and Family:   . Frequency of Social Gatherings with Friends and Family:   . Attends Religious Services:   . Active Member of Clubs or Organizations:   . Attends Archivist Meetings:   Marland Kitchen Marital Status:   Intimate Partner Violence:   . Fear of Current or Ex-Partner:   . Emotionally Abused:   Marland Kitchen Physically Abused:   . Sexually Abused:     Physical Exam Vitals reviewed.  Constitutional:      Appearance: He is normal weight.  HENT:     Head: Normocephalic.     Nose: Nose normal.     Mouth/Throat:     Mouth: Mucous membranes are moist.     Pharynx: Oropharynx is clear.  Eyes:      Pupils: Pupils are equal, round, and reactive to light.  Cardiovascular:     Rate and Rhythm: Normal rate and regular rhythm.     Pulses: Normal pulses.     Heart sounds: Normal heart sounds.  Pulmonary:     Effort: Pulmonary effort is normal.     Breath sounds: Normal breath sounds.  Abdominal:     General: Abdomen is flat.     Palpations: Abdomen is soft.  Musculoskeletal:  General: Normal range of motion.     Right lower leg: No edema.     Left lower leg: No edema.  Skin:    General: Skin is warm and dry.     Capillary Refill: Capillary refill takes less than 2 seconds.  Neurological:     Mental Status: He is alert. Mental status is at baseline.  Psychiatric:        Mood and Affect: Mood normal.     Arrived for home visit for [place] who was seated in his recliner watching tv alert and oriented. Kailan denied any complaints stating he feels fine with no shortness of breath or chest pain. He also denied dizziness, trouble sleeping, eating or walking. [name] stated he has had much improvement over the last week. Mr. [name] vitals were obtained and are as noted in report. Medications were reviewed and verified. Pill box was filled accordingly. [name] was made aware of appointments and he agreed to same. Flomax and Corlanor transferred to [facility].   Refills: -Loperamide        Future Appointments  Date Time Provider [place]  07/07/2019 10:00 AM [facility] [place] [facility] None  08/16/2019 12:00 PM Larey Dresser, MD [facility] None  10/20/2019 11:00 AM Gayland Curry, DO [facility] None  01/24/2020  2:15 PM [name], [name], NP [facility] None     ACTION: Home visit completed Next visit planned for one week

## 2019-07-05 ENCOUNTER — Other Ambulatory Visit (HOSPITAL_COMMUNITY): Payer: Self-pay

## 2019-07-05 NOTE — Progress Notes (Signed)
Mr Brent [name] was seen at home today and reported feeling generally well. He dneied chest pain, SOB, headache, dizziness, orthopnea, fever or cough since his last visit. He state he has been compliant with his medications over the past week. Today's visit was to refill his pillbox since heather was out of work. I advised she would follow up next week.   Jacquiline Doe, EMT 07/05/19

## 2019-07-06 ENCOUNTER — Telehealth (HOSPITAL_COMMUNITY): Payer: Self-pay

## 2019-07-06 NOTE — Telephone Encounter (Signed)
Brent [name] stated he has [employer] paperwork and needs assistance filling out. I will pick up same from patient next week and assist. Patient agreed.

## 2019-07-07 ENCOUNTER — Other Ambulatory Visit: Payer: Self-pay

## 2019-07-07 ENCOUNTER — Ambulatory Visit (HOSPITAL_COMMUNITY)
Admission: RE | Admit: 2019-07-07 | Discharge: 2019-07-07 | Disposition: A | Discharge status: Home / Self Care | Payer: Medicare Other | Source: Ambulatory Visit | Attending: Nephrology | Admitting: Nephrology

## 2019-07-07 DIAGNOSIS — D631 Anemia in chronic kidney disease: Secondary | ICD-10-CM | POA: Diagnosis not present

## 2019-07-07 DIAGNOSIS — N183 Chronic kidney disease, stage 3 unspecified: Secondary | ICD-10-CM | POA: Diagnosis not present

## 2019-07-07 LAB — HEMOGLOBIN AND HEMATOCRIT, BLOOD
HCT: 34 % — ABNORMAL LOW (ref 39.0–52.0)
Hemoglobin: 10.3 g/dL — ABNORMAL LOW (ref 13.0–17.0)

## 2019-07-07 LAB — IRON AND TIBC
Iron: 72 ug/dL (ref 45–182)
Saturation Ratios: 31 % (ref 17.9–39.5)
TIBC: 235 ug/dL — ABNORMAL LOW (ref 250–450)
UIBC: 163 ug/dL

## 2019-07-07 LAB — FERRITIN: Ferritin: 687 ng/mL — ABNORMAL HIGH (ref 24–336)

## 2019-07-07 MED ORDER — EPOETIN ALFA 10000 UNIT/ML IJ SOLN
10000.0000 [IU] | Freq: Once | INTRAMUSCULAR | Status: AC
  Administered 2019-07-07: 10000 [IU] via SUBCUTANEOUS
  Filled 2019-07-07: qty 1

## 2019-07-07 NOTE — Progress Notes (Signed)
PATIENT CARE CENTER NOTE   Provider: Elmarie Shiley MD   Procedure: Epoetin (procrit) injection   Note: Patient received sub-q procrit injection in the right arm. Labs were drawn pre injection and hemoglobin was 10.3. Patient's BP was 113/67 . Discharge instructions given, patient alert, oriented and ambulatory at discharge.

## 2019-07-07 NOTE — Discharge Instructions (Signed)

## 2019-07-12 ENCOUNTER — Other Ambulatory Visit (HOSPITAL_COMMUNITY): Payer: Self-pay

## 2019-07-12 NOTE — Progress Notes (Signed)
Paramedicine Encounter    Patient ID: Brent [name], male    DOB: May 25, 1945, 74 y.o.   MRN: [id number]   Patient Care Team: Gayland Curry, DO as PCP - General (Geriatric Medicine) [place]ique, Peter M, MD as PCP - Cardiology (Cardiology) Larey Dresser, MD as PCP - Advanced Heart Failure (Cardiology) Elmarie Shiley, MD (Nephrology) Milus Banister, MD (Gastroenterology)  Patient Active Problem List   Diagnosis Date Noted  . Chronic obstructive pulmonary disease ([place]) 06/20/2019  . Ventricular tachycardia ([place]) 06/20/2019  . Acute on chronic systolic (congestive) heart failure ([place]) 04/25/2019  . Metabolic acidosis 60/73/7106  . Orthostasis 09/07/2018  . AKI (acute kidney injury) ([place])   . Immunosuppressed status ([place])   . Macrocytic anemia   . Chronic combined systolic and diastolic congestive heart failure ([place]) 06/03/2018  . Candida esophagitis ([place]) 09/22/2017  . History of smoking 30 or more pack years 01/05/2017  . Bipolar 1 disorder ([place])   . BPH (benign prostatic hyperplasia) 03/31/2013  . 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 Essential hypertension, benign 03/31/2013  . Acute renal failure superimposed on stage 3 chronic kidney disease ([place]) 06/04/2012  . 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[place]) 01/23/2010    Current Outpatient Medications:  .  allopurinol (ZYLOPRIM) 100 MG tablet, Take 2 tablets (200 mg total) by mouth daily., Disp: 60 tablet, Rfl: 5 .  aspirin EC 81 MG tablet, Take 1 tablet (81 mg total) by mouth daily., Disp: 90 tablet, Rfl: 3 .  Cholecalciferol (VITAMIN D) 125 MCG (5000 UT) CAPS, Take 5,000 Units by mouth daily. , Disp: , Rfl:  .  divalproex (DEPAKOTE ER) 500 MG 24 hr tablet, Take 1,500 mg by mouth at bedtime., Disp: , Rfl:  .  epoetin alfa (PROCRIT) 26948 UNIT/ML injection, Inject 10,000 Units into the skin every 30 (thirty) days. , Disp: , Rfl:  .  ferrous sulfate 325 (65 FE) MG tablet, Take 325 mg by mouth every Monday, Wednesday, and Friday. , Disp:  , Rfl:  .  hydrALAZINE (APRESOLINE) 25 MG tablet, Take 1 tablet (25 mg total) by mouth 2 (two) times daily., Disp: 60 tablet, Rfl: 3 .  inFLIXimab (REMICADE) 100 MG injection, Infuse Remicade IV schedule 1 71m/kg every 8 weeks Premedicate with Tylenol 500-6519mby mouth and Benadryl 25-5043my mouth prior to infusion. Last PPD was on 12/2009. , Disp: 1 each, Rfl: 6 .  isosorbide mononitrate (IMDUR) 30 MG 24 hr tablet, Take 1 tablet (30 mg total) by mouth daily., Disp: 90 tablet, Rfl: 3 .  ivabradine (CORLANOR) 5 MG TABS tablet, Take 1 tablet (5 mg total) by mouth 2 (two) times daily with a meal., Disp: 180 tablet, Rfl: 3 .  loperamide (IMODIUM) 2 MG capsule, Take 1 capsule (2 mg total) by mouth 4 (four) times daily as needed for diarrhea or loose stools., Disp: 12 capsule, Rfl: 0 .  magnesium oxide (MAG-OX) 400 MG tablet, Take 800 mg by mouth in the morning, at noon, and at bedtime. , Disp: , Rfl:  .  metoprolol succinate (TOPROL-XL) 25 MG 24 hr tablet, Take 1 tablet (25 mg total) by mouth at bedtime., Disp: 90 tablet, Rfl: 3 .  OLANZapine (ZYPREXA) 7.5 MG tablet, Take 1 tablet (7.5 mg total) by mouth at bedtime., Disp: 30 tablet, Rfl: 11 .  omeprazole (PRILOSEC) 20 MG capsule, Take 20 mg by mouth daily before breakfast. , Disp: , Rfl:  .  tamsulosin (FLOMAX) 0.4 MG CAPS capsule, Take 1 capsule (0.4 mg  total) by mouth daily., Disp: 30 capsule, Rfl: 2 .  triamcinolone (NASACORT ALLERGY 24HR) 55 MCG/ACT AERO nasal inhaler, Place 2 sprays into the nose daily as needed (allergies)., Disp: , Rfl:  .  vitamin B-12 (CYANOCOBALAMIN) 1000 MCG tablet, Take 1,000 mcg by mouth daily., Disp: , Rfl:  .  mupirocin ointment (BACTROBAN) 2 %, Place 1 application into the nose daily., Disp: , Rfl:  Allergies  Allergen Reactions  . Azathioprine Other (See Comments)    REACTION: affected WBC "Almost died"  . Ciprofloxacin Other (See Comments)    Unknown rxn  . Levaquin [Levofloxacin In D5w] Other (See Comments)     Unknown rxn  . Plendil [Felodipine] Other (See Comments)    Unknown rxn     Social History   Socioeconomic History  . Marital status: Divorced    Spouse name: Not on file  . Number of children: 1  . Years of education: 22  . Highest education level: Not on file  Occupational History  . Occupation: retired  . Occupation: Veteran  Tobacco Use  . Smoking status: Former Smoker    Packs/day: 1.00    Years: 49.00    Pack years: 49.00    Types: Cigarettes    Start date: 04/11/1956    Quit date: 04/17/2014    Years since quitting: 5.2  . Smokeless tobacco: Never Used  Substance and Sexual Activity  . Alcohol use: No    Alcohol/week: 0.0 standard drinks  . Drug use: No  . Sexual activity: Never  Other Topics Concern  . Not on file  Social History Narrative  . Not on file   Social Determinants of Health   Financial Resource Strain:   . Difficulty of Paying Living Expenses:   Food Insecurity:   . Worried About Charity fundraiser in the Last Year:   . Arboriculturist in the Last Year:   Transportation Needs:   . Film/video editor (Medical):   Marland Kitchen Lack of Transportation (Non-Medical):   Physical Activity:   . Days of Exercise per Week:   . Minutes of Exercise per Session:   Stress:   . Feeling of Stress :   Social Connections:   . Frequency of Communication with Friends and Family:   . Frequency of Social Gatherings with Friends and Family:   . Attends Religious Services:   . Active Member of Clubs or Organizations:   . Attends Archivist Meetings:   Marland Kitchen Marital Status:   Intimate Partner Violence:   . Fear of Current or Ex-Partner:   . Emotionally Abused:   Marland Kitchen Physically Abused:   . Sexually Abused:     Physical Exam Vitals reviewed.  Constitutional:      Appearance: He is normal weight.  HENT:     Head: Normocephalic.     Nose: Nose normal.     Mouth/Throat:     Mouth: Mucous membranes are moist.  Eyes:     Pupils: Pupils are equal, round, and  reactive to light.  Cardiovascular:     Rate and Rhythm: Normal rate and regular rhythm.     Pulses: Normal pulses.     Heart sounds: Normal heart sounds.  Pulmonary:     Effort: Pulmonary effort is normal.     Breath sounds: Normal breath sounds.  Abdominal:     General: Abdomen is flat.     Palpations: Abdomen is soft.  Musculoskeletal:        General: Normal  range of motion.     Cervical back: Normal range of motion and neck supple.     Right lower leg: No edema.     Left lower leg: No edema.  Skin:    General: Skin is warm and dry.     Capillary Refill: Capillary refill takes less than 2 seconds.  Neurological:     Mental Status: He is alert. Mental status is at baseline.  Psychiatric:        Mood and Affect: Mood normal.      Arrived for home visit for Brent [name] who was seated in his recliner and alert and oriented stating he is feeling fine but reports he ate chicken liver yesterday with salt and today is having increase in weight and shortness of breath. No edema noted, no JVD, lung sounds clear. Brent [name] short of breath when walking, but not while at rest. Brent [name] was educated that he cannot eat those certain things. He understood the importance of decreasing his salt intake. Meds verified and reviewed. Pill box filled accordingly. Vitals as noted in report. [employer] paperwork sent over to Brent [name]. Home visit complete.    Refills: Vitamin D Aspirin Omeprazole   Weight- 181lbs    Future Appointments  Date Time Provider [place]  08/04/2019 10:00 AM [facility] 1 [facility] None  08/16/2019 12:00 PM Larey Dresser, MD [facility] None  10/20/2019 11:00 AM Gayland Curry, DO [facility] None  01/24/2020  2:15 PM [name], [name], NP [facility] None     ACTION: Home visit completed Next visit planned for one week

## 2019-07-19 ENCOUNTER — Other Ambulatory Visit (HOSPITAL_COMMUNITY): Payer: Self-pay

## 2019-07-19 NOTE — Progress Notes (Signed)
Paramedicine Encounter    Patient ID: Brent [name], male    DOB: 1945-03-06, 74 y.o.   MRN: [id number]   Patient Care Team: Gayland Curry, DO as PCP - General (Geriatric Medicine) [place], Peter M, MD as PCP - Cardiology (Cardiology) Larey Dresser, MD as PCP - Advanced Heart Failure (Cardiology) Elmarie Shiley, MD (Nephrology) Milus Banister, MD (Gastroenterology)  Patient Active Problem List   Diagnosis Date Noted  . Chronic obstructive pulmonary disease ([place]) 06/20/2019  . Ventricular tachycardia ([place]) 06/20/2019  . Acute on chronic systolic (congestive) heart failure ([place]) 04/25/2019  . Metabolic acidosis 39/04/90  . Orthostasis 09/07/2018  . AKI (acute kidney injury) ([place])   . Immunosuppressed status ([place])   . Macrocytic anemia   . Chronic combined systolic and diastolic congestive heart failure ([place]) 06/03/2018  . Candida esophagitis ([place]) 09/22/2017  . History of smoking 30 or more pack years 01/05/2017  . Bipolar 1 disorder ([place])   . BPH (benign prostatic hyperplasia) 03/31/2013  . 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 Essential hypertension, benign 03/31/2013  . Acute renal failure superimposed on stage 3 chronic kidney disease ([place]) 06/04/2012  . 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[place]) 01/23/2010    Current Outpatient Medications:  .  allopurinol (ZYLOPRIM) 100 MG tablet, Take 2 tablets (200 mg total) by mouth daily., Disp: 60 tablet, Rfl: 5 .  aspirin EC 81 MG tablet, Take 1 tablet (81 mg total) by mouth daily., Disp: 90 tablet, Rfl: 3 .  Cholecalciferol (VITAMIN D) 125 MCG (5000 UT) CAPS, Take 5,000 Units by mouth daily. , Disp: , Rfl:  .  divalproex (DEPAKOTE ER) 500 MG 24 hr tablet, Take 1,500 mg by mouth at bedtime., Disp: , Rfl:  .  epoetin alfa (PROCRIT) 33007 UNIT/ML injection, Inject 10,000 Units into the skin every 30 (thirty) days. , Disp: , Rfl:  .  ferrous sulfate 325 (65 FE) MG tablet, Take 325 mg by mouth every Monday, Wednesday, and Friday. , Disp:  , Rfl:  .  hydrALAZINE (APRESOLINE) 25 MG tablet, Take 1 tablet (25 mg total) by mouth 2 (two) times daily., Disp: 60 tablet, Rfl: 3 .  inFLIXimab (REMICADE) 100 MG injection, Infuse Remicade IV schedule 1 71m/kg every 8 weeks Premedicate with Tylenol 500-6525mby mouth and Benadryl 25-5091my mouth prior to infusion. Last PPD was on 12/2009. , Disp: 1 each, Rfl: 6 .  isosorbide mononitrate (IMDUR) 30 MG 24 hr tablet, Take 1 tablet (30 mg total) by mouth daily., Disp: 90 tablet, Rfl: 3 .  ivabradine (CORLANOR) 5 MG TABS tablet, Take 1 tablet (5 mg total) by mouth 2 (two) times daily with a meal., Disp: 180 tablet, Rfl: 3 .  loperamide (IMODIUM) 2 MG capsule, Take 1 capsule (2 mg total) by mouth 4 (four) times daily as needed for diarrhea or loose stools., Disp: 12 capsule, Rfl: 0 .  magnesium oxide (MAG-OX) 400 MG tablet, Take 800 mg by mouth in the morning, at noon, and at bedtime. , Disp: , Rfl:  .  metoprolol succinate (TOPROL-XL) 25 MG 24 hr tablet, Take 1 tablet (25 mg total) by mouth at bedtime., Disp: 90 tablet, Rfl: 3 .  OLANZapine (ZYPREXA) 7.5 MG tablet, Take 1 tablet (7.5 mg total) by mouth at bedtime., Disp: 30 tablet, Rfl: 11 .  omeprazole (PRILOSEC) 20 MG capsule, Take 20 mg by mouth daily before breakfast. , Disp: , Rfl:  .  tamsulosin (FLOMAX) 0.4 MG CAPS capsule, Take 1 capsule (0.4 mg  total) by mouth daily., Disp: 30 capsule, Rfl: 2 .  triamcinolone (NASACORT ALLERGY 24HR) 55 MCG/ACT AERO nasal inhaler, Place 2 sprays into the nose daily as needed (allergies)., Disp: , Rfl:  .  vitamin B-12 (CYANOCOBALAMIN) 1000 MCG tablet, Take 1,000 mcg by mouth daily., Disp: , Rfl:  .  mupirocin ointment (BACTROBAN) 2 %, Place 1 application into the nose daily., Disp: , Rfl:  Allergies  Allergen Reactions  . Azathioprine Other (See Comments)    REACTION: affected WBC "Almost died"  . Ciprofloxacin Other (See Comments)    Unknown rxn  . Levaquin [Levofloxacin In D5w] Other (See Comments)     Unknown rxn  . Plendil [Felodipine] Other (See Comments)    Unknown rxn     Social History   Socioeconomic History  . Marital status: Divorced    Spouse name: Not on file  . Number of children: 1  . Years of education: 44  . Highest education level: Not on file  Occupational History  . Occupation: retired  . Occupation: Veteran  Tobacco Use  . Smoking status: Former Smoker    Packs/day: 1.00    Years: 49.00    Pack years: 49.00    Types: Cigarettes    Start date: 04/11/1956    Quit date: 04/17/2014    Years since quitting: 5.2  . Smokeless tobacco: Never Used  Substance and Sexual Activity  . Alcohol use: No    Alcohol/week: 0.0 standard drinks  . Drug use: No  . Sexual activity: Never  Other Topics Concern  . Not on file  Social History Narrative  . Not on file   Social Determinants of Health   Financial Resource Strain:   . Difficulty of Paying Living Expenses:   Food Insecurity:   . Worried About Charity fundraiser in the Last Year:   . Arboriculturist in the Last Year:   Transportation Needs:   . Film/video editor (Medical):   Marland Kitchen Lack of Transportation (Non-Medical):   Physical Activity:   . Days of Exercise per Week:   . Minutes of Exercise per Session:   Stress:   . Feeling of Stress :   Social Connections:   . Frequency of Communication with Friends and Family:   . Frequency of Social Gatherings with Friends and Family:   . Attends Religious Services:   . Active Member of Clubs or Organizations:   . Attends Archivist Meetings:   Marland Kitchen Marital Status:   Intimate Partner Violence:   . Fear of Current or Ex-Partner:   . Emotionally Abused:   Marland Kitchen Physically Abused:   . Sexually Abused:     Physical Exam Vitals reviewed.  Constitutional:      Appearance: He is normal weight.  HENT:     Head: Normocephalic.     Mouth/Throat:     Mouth: Mucous membranes are moist.  Eyes:     Pupils: Pupils are equal, round, and reactive to light.   Cardiovascular:     Rate and Rhythm: Normal rate and regular rhythm.     Pulses: Normal pulses.     Heart sounds: Normal heart sounds.  Pulmonary:     Effort: Pulmonary effort is normal.     Breath sounds: Normal breath sounds.  Abdominal:     General: Abdomen is flat.     Palpations: Abdomen is soft.  Musculoskeletal:        General: Normal range of motion.  Cervical back: Normal range of motion.     Right lower leg: No edema.     Left lower leg: No edema.  Skin:    General: Skin is warm and dry.     Capillary Refill: Capillary refill takes less than 2 seconds.  Neurological:     Mental Status: He is alert. Mental status is at baseline.  Psychiatric:        Mood and Affect: Mood normal.      Arrived for home visit. Meds reviewed and verified. No complaints by Brent [name]. No edema, shortness of breath, chest pain, dizziness or trouble eating, sleeping. Pill box filled accordingly. Vitals as noted. I will see Brent. [name] in one week.   No refills   Future Appointments  Date Time Provider [place]  08/04/2019 10:00 AM [facility] 1 [facility] None  08/16/2019 12:00 PM Larey Dresser, MD [facility] None  10/20/2019 11:00 AM Gayland Curry, DO [facility] None  01/24/2020  2:15 PM [name], [name], NP [facility] None     ACTION: Home visit completed Next visit planned for one week

## 2019-07-26 ENCOUNTER — Telehealth (HOSPITAL_COMMUNITY): Payer: Self-pay

## 2019-07-26 NOTE — Telephone Encounter (Signed)
Received a fax requesting medical records from [facility]([place] location). Records were successfully faxed to: [phone] ,which was the number provided.. Medical request form will be scanned into patients chart.

## 2019-07-27 ENCOUNTER — Other Ambulatory Visit (HOSPITAL_COMMUNITY): Payer: Self-pay

## 2019-07-27 NOTE — Progress Notes (Signed)
Paramedicine Encounter    Patient ID: Brent [name], male    DOB: [birth year], 74 y.o.   MRN: [id number]   Patient Care Team: Gayland Curry, DO as PCP - General (Geriatric Medicine) Martinique, Peter M, MD as PCP - Cardiology (Cardiology) Larey Dresser, MD as PCP - Advanced Heart Failure (Cardiology) Elmarie Shiley, MD (Nephrology) Milus Banister, MD (Gastroenterology)  Patient Active Problem List   Diagnosis Date Noted  . Chronic obstructive pulmonary disease ([place]) 06/20/2019  . Ventricular tachycardia ([place]) 06/20/2019  . Acute on chronic systolic (congestive) heart failure ([place]) 04/25/2019  . Metabolic acidosis 86/76/1950  . Orthostasis 09/07/2018  . AKI (acute kidney injury) ([place])   . Immunosuppressed status ([place])   . Macrocytic anemia   . Chronic combined systolic and diastolic congestive heart failure ([place]) 06/03/2018  . Candida esophagitis ([place]) 09/22/2017  . History of smoking 30 or more pack years 01/05/2017  . Bipolar 1 disorder ([place])   . BPH (benign prostatic hyperplasia) 03/31/2013  . 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 Essential hypertension, benign 03/31/2013  . Acute renal failure superimposed on stage 3 chronic kidney disease ([place]) 06/04/2012  . 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[place]) 01/23/2010    Current Outpatient Medications:  .  allopurinol (ZYLOPRIM) 100 MG tablet, Take 2 tablets (200 mg total) by mouth daily., Disp: 60 tablet, Rfl: 5 .  aspirin EC 81 MG tablet, Take 1 tablet (81 mg total) by mouth daily., Disp: 90 tablet, Rfl: 3 .  Cholecalciferol (VITAMIN D) 125 MCG (5000 UT) CAPS, Take 5,000 Units by mouth daily. , Disp: , Rfl:  .  divalproex (DEPAKOTE ER) 500 MG 24 hr tablet, Take 1,500 mg by mouth at bedtime., Disp: , Rfl:  .  epoetin alfa (PROCRIT) 93267 UNIT/ML injection, Inject 10,000 Units into the skin every 30 (thirty) days. , Disp: , Rfl:  .  ferrous sulfate 325 (65 FE) MG tablet, Take 325 mg by mouth every Monday, Wednesday, and Friday. , Disp:  , Rfl:  .  hydrALAZINE (APRESOLINE) 25 MG tablet, Take 1 tablet (25 mg total) by mouth 2 (two) times daily., Disp: 60 tablet, Rfl: 3 .  inFLIXimab (REMICADE) 100 MG injection, Infuse Remicade IV schedule 1 25m/kg every 8 weeks Premedicate with Tylenol 500-6522mby mouth and Benadryl 25-5048my mouth prior to infusion. Last PPD was on 12/2009. , Disp: 1 each, Rfl: 6 .  isosorbide mononitrate (IMDUR) 30 MG 24 hr tablet, Take 1 tablet (30 mg total) by mouth daily., Disp: 90 tablet, Rfl: 3 .  ivabradine (CORLANOR) 5 MG TABS tablet, Take 1 tablet (5 mg total) by mouth 2 (two) times daily with a meal., Disp: 180 tablet, Rfl: 3 .  loperamide (IMODIUM) 2 MG capsule, Take 1 capsule (2 mg total) by mouth 4 (four) times daily as needed for diarrhea or loose stools., Disp: 12 capsule, Rfl: 0 .  magnesium oxide (MAG-OX) 400 MG tablet, Take 800 mg by mouth in the morning, at noon, and at bedtime. , Disp: , Rfl:  .  metoprolol succinate (TOPROL-XL) 25 MG 24 hr tablet, Take 1 tablet (25 mg total) by mouth at bedtime., Disp: 90 tablet, Rfl: 3 .  OLANZapine (ZYPREXA) 7.5 MG tablet, Take 1 tablet (7.5 mg total) by mouth at bedtime., Disp: 30 tablet, Rfl: 11 .  omeprazole (PRILOSEC) 20 MG capsule, Take 20 mg by mouth daily before breakfast. , Disp: , Rfl:  .  tamsulosin (FLOMAX) 0.4 MG CAPS capsule, Take 1 capsule (0.4 mg  total) by mouth daily., Disp: 30 capsule, Rfl: 2 .  triamcinolone (NASACORT ALLERGY 24HR) 55 MCG/ACT AERO nasal inhaler, Place 2 sprays into the nose daily as needed (allergies)., Disp: , Rfl:  .  vitamin B-12 (CYANOCOBALAMIN) 1000 MCG tablet, Take 1,000 mcg by mouth daily., Disp: , Rfl:  .  mupirocin ointment (BACTROBAN) 2 %, Place 1 application into the nose daily., Disp: , Rfl:  Allergies  Allergen Reactions  . Azathioprine Other (See Comments)    REACTION: affected WBC "Almost died"  . Ciprofloxacin Other (See Comments)    Unknown rxn  . Levaquin [Levofloxacin In D5w] Other (See Comments)     Unknown rxn  . Plendil [Felodipine] Other (See Comments)    Unknown rxn     Social History   Socioeconomic History  . Marital status: Divorced    Spouse name: Not on file  . Number of children: 1  . Years of education: 40  . Highest education level: Not on file  Occupational History  . Occupation: retired  . Occupation: Veteran  Tobacco Use  . Smoking status: Former Smoker    Packs/day: 1.00    Years: 49.00    Pack years: 49.00    Types: Cigarettes    Start date: 04/11/1956    Quit date: 04/17/2014    Years since quitting: 5.2  . Smokeless tobacco: Never Used  Substance and Sexual Activity  . Alcohol use: No    Alcohol/week: 0.0 standard drinks  . Drug use: No  . Sexual activity: Never  Other Topics Concern  . Not on file  Social History Narrative  . Not on file   Social Determinants of Health   Financial Resource Strain:   . Difficulty of Paying Living Expenses:   Food Insecurity:   . Worried About Charity fundraiser in the Last Year:   . Arboriculturist in the Last Year:   Transportation Needs:   . Film/video editor (Medical):   Marland Kitchen Lack of Transportation (Non-Medical):   Physical Activity:   . Days of Exercise per Week:   . Minutes of Exercise per Session:   Stress:   . Feeling of Stress :   Social Connections:   . Frequency of Communication with Friends and Family:   . Frequency of Social Gatherings with Friends and Family:   . Attends Religious Services:   . Active Member of Clubs or Organizations:   . Attends Archivist Meetings:   Marland Kitchen Marital Status:   Intimate Partner Violence:   . Fear of Current or Ex-Partner:   . Emotionally Abused:   Marland Kitchen Physically Abused:   . Sexually Abused:     Physical Exam   Arrived for home visit for Brent [name] who was alert and oriented seated in his recliner at home stating that he has been feeling great. Medications were reviewed and confirmed. Pill box filled accordingly. Vitals obtained. Brent [name] was  given upcoming appointments to place in his calendar. Brent [name] agreed to home visit in one week. Home visit complete.   Refills: NONE   Future Appointments  Date Time Provider [place]  08/04/2019 10:00 AM [facility] RM 1 [facility] None  08/16/2019 12:00 PM Larey Dresser, MD [facility] None  10/20/2019 11:00 AM Gayland Curry, DO [facility] None  01/24/2020  2:15 PM [name], [name], NP [facility] None     ACTION: Home visit completed Next visit planned for one week

## 2019-08-03 ENCOUNTER — Telehealth (HOSPITAL_COMMUNITY): Payer: Self-pay | Admitting: *Deleted

## 2019-08-03 ENCOUNTER — Other Ambulatory Visit (HOSPITAL_COMMUNITY): Payer: Self-pay

## 2019-08-03 NOTE — Progress Notes (Signed)
Paramedicine Encounter    Patient ID: Brent [name], male    DOB: 09/02/1945, 74 y.o.   MRN: [id number]   Patient Care Team: Gayland Curry, DO as PCP - General (Geriatric Medicine) Martinique, Peter M, MD as PCP - Cardiology (Cardiology) Larey Dresser, MD as PCP - Advanced Heart Failure (Cardiology) Elmarie Shiley, MD (Nephrology) Milus Banister, MD (Gastroenterology)  Patient Active Problem List   Diagnosis Date Noted  . Chronic obstructive pulmonary disease ([place]) 06/20/2019  . Ventricular tachycardia ([place]) 06/20/2019  . Acute on chronic systolic (congestive) heart failure ([place]) 04/25/2019  . Metabolic acidosis 68/61/6837  . Orthostasis 09/07/2018  . AKI (acute kidney injury) ([place])   . Immunosuppressed status ([place])   . Macrocytic anemia   . Chronic combined systolic and diastolic congestive heart failure ([place]) 06/03/2018  . Candida esophagitis ([place]) 09/22/2017  . History of smoking 30 or more pack years 01/05/2017  . Bipolar 1 disorder ([place])   . BPH (benign prostatic hyperplasia) 03/31/2013  . 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 Essential hypertension, benign 03/31/2013  . Acute renal failure superimposed on stage 3 chronic kidney disease ([place]) 06/04/2012  . 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[place]) 01/23/2010    Current Outpatient Medications:  .  allopurinol (ZYLOPRIM) 100 MG tablet, Take 2 tablets (200 mg total) by mouth daily., Disp: 60 tablet, Rfl: 5 .  aspirin EC 81 MG tablet, Take 1 tablet (81 mg total) by mouth daily., Disp: 90 tablet, Rfl: 3 .  Cholecalciferol (VITAMIN D) 125 MCG (5000 UT) CAPS, Take 5,000 Units by mouth daily. , Disp: , Rfl:  .  divalproex (DEPAKOTE ER) 500 MG 24 hr tablet, Take 1,500 mg by mouth at bedtime., Disp: , Rfl:  .  epoetin alfa (PROCRIT) 29021 UNIT/ML injection, Inject 10,000 Units into the skin every 30 (thirty) days. , Disp: , Rfl:  .  ferrous sulfate 325 (65 FE) MG tablet, Take 325 mg by mouth every Monday, Wednesday, and Friday. , Disp:  , Rfl:  .  hydrALAZINE (APRESOLINE) 25 MG tablet, Take 1 tablet (25 mg total) by mouth 2 (two) times daily., Disp: 60 tablet, Rfl: 3 .  inFLIXimab (REMICADE) 100 MG injection, Infuse Remicade IV schedule 1 35m/kg every 8 weeks Premedicate with Tylenol 500-6526mby mouth and Benadryl 25-5037my mouth prior to infusion. Last PPD was on 12/2009. , Disp: 1 each, Rfl: 6 .  isosorbide mononitrate (IMDUR) 30 MG 24 hr tablet, Take 1 tablet (30 mg total) by mouth daily., Disp: 90 tablet, Rfl: 3 .  ivabradine (CORLANOR) 5 MG TABS tablet, Take 1 tablet (5 mg total) by mouth 2 (two) times daily with a meal., Disp: 180 tablet, Rfl: 3 .  loperamide (IMODIUM) 2 MG capsule, Take 1 capsule (2 mg total) by mouth 4 (four) times daily as needed for diarrhea or loose stools., Disp: 12 capsule, Rfl: 0 .  magnesium oxide (MAG-OX) 400 MG tablet, Take 800 mg by mouth in the morning, at noon, and at bedtime. , Disp: , Rfl:  .  metoprolol succinate (TOPROL-XL) 25 MG 24 hr tablet, Take 1 tablet (25 mg total) by mouth at bedtime., Disp: 90 tablet, Rfl: 3 .  OLANZapine (ZYPREXA) 7.5 MG tablet, Take 1 tablet (7.5 mg total) by mouth at bedtime., Disp: 30 tablet, Rfl: 11 .  omeprazole (PRILOSEC) 20 MG capsule, Take 20 mg by mouth daily before breakfast. , Disp: , Rfl:  .  tamsulosin (FLOMAX) 0.4 MG CAPS capsule, Take 1 capsule (0.4 mg  total) by mouth daily., Disp: 30 capsule, Rfl: 2 .  triamcinolone (NASACORT ALLERGY 24HR) 55 MCG/ACT AERO nasal inhaler, Place 2 sprays into the nose daily as needed (allergies)., Disp: , Rfl:  .  vitamin B-12 (CYANOCOBALAMIN) 1000 MCG tablet, Take 1,000 mcg by mouth daily., Disp: , Rfl:  .  mupirocin ointment (BACTROBAN) 2 %, Place 1 application into the nose daily., Disp: , Rfl:  Allergies  Allergen Reactions  . Azathioprine Other (See Comments)    REACTION: affected WBC "Almost died"  . Ciprofloxacin Other (See Comments)    Unknown rxn  . Levaquin [Levofloxacin In D5w] Other (See Comments)     Unknown rxn  . Plendil [Felodipine] Other (See Comments)    Unknown rxn     Social History   Socioeconomic History  . Marital status: Divorced    Spouse name: Not on file  . Number of children: 1  . Years of education: 23  . Highest education level: Not on file  Occupational History  . Occupation: retired  . Occupation: Veteran  Tobacco Use  . Smoking status: Former Smoker    Packs/day: 1.00    Years: 49.00    Pack years: 49.00    Types: Cigarettes    Start date: 04/11/1956    Quit date: 04/17/2014    Years since quitting: 5.2  . Smokeless tobacco: Never Used  Substance and Sexual Activity  . Alcohol use: No    Alcohol/week: 0.0 standard drinks  . Drug use: No  . Sexual activity: Never  Other Topics Concern  . Not on file  Social History Narrative  . Not on file   Social Determinants of Health   Financial Resource Strain:   . Difficulty of Paying Living Expenses:   Food Insecurity:   . Worried About Charity fundraiser in the Last Year:   . Arboriculturist in the Last Year:   Transportation Needs:   . Film/video editor (Medical):   Marland Kitchen Lack of Transportation (Non-Medical):   Physical Activity:   . Days of Exercise per Week:   . Minutes of Exercise per Session:   Stress:   . Feeling of Stress :   Social Connections:   . Frequency of Communication with Friends and Family:   . Frequency of Social Gatherings with Friends and Family:   . Attends Religious Services:   . Active Member of Clubs or Organizations:   . Attends Archivist Meetings:   Marland Kitchen Marital Status:   Intimate Partner Violence:   . Fear of Current or Ex-Partner:   . Emotionally Abused:   Marland Kitchen Physically Abused:   . Sexually Abused:     Physical Exam Vitals reviewed.  Constitutional:      Appearance: He is normal weight.  HENT:     Head: Normocephalic.     Nose: Nose normal.     Mouth/Throat:     Mouth: Mucous membranes are moist.  Eyes:     Pupils: Pupils are equal, round, and  reactive to light.  Cardiovascular:     Rate and Rhythm: Normal rate and regular rhythm.     Pulses: Normal pulses.  Pulmonary:     Effort: Pulmonary effort is normal.     Breath sounds: Normal breath sounds.  Abdominal:     Palpations: Abdomen is soft.  Musculoskeletal:        General: Normal range of motion.     Cervical back: Normal range of motion.  Right lower leg: No edema.     Left lower leg: No edema.  Skin:    General: Skin is warm and dry.     Capillary Refill: Capillary refill takes less than 2 seconds.  Neurological:     Mental Status: He is alert. Mental status is at baseline.  Psychiatric:        Mood and Affect: Mood normal.    Arrived for home visit for Micael Hampshire where he was seated alert and oriented. Naren denied any complaints or recent illness. Barnett states he has had no issues with dizziness, chest pain, shortness of breath. Vitals were obtained. Medications were confirmed, verified pill box filled accordingly. [place] was made aware of upcoming appointments. Home visit completed. I will see Dilraj in one week.   Refills: Terbinafine      Future Appointments  Date Time Provider [employer]  08/04/2019 10:00 AM [facility] RM 1 [facility] None  08/16/2019 12:00 PM Larey Dresser, MD [facility] None  10/20/2019 11:00 AM Gayland Curry, DO [facility] None  01/24/2020  2:15 PM [name], [name], NP [facility] None     ACTION: Home visit completed Next visit planned for one week

## 2019-08-03 NOTE — Telephone Encounter (Signed)
Pt is receiving Paramedicine and Remote health, per Dr [name] pt does not need remote health, order faxed to them at [phone] to d/c remote health

## 2019-08-04 ENCOUNTER — Ambulatory Visit (HOSPITAL_COMMUNITY)
Admission: RE | Admit: 2019-08-04 | Discharge: 2019-08-04 | Disposition: A | Discharge status: Home / Self Care | Payer: Medicare Other | Source: Ambulatory Visit | Attending: Nephrology | Admitting: Nephrology

## 2019-08-04 ENCOUNTER — Other Ambulatory Visit: Payer: Self-pay

## 2019-08-04 DIAGNOSIS — D631 Anemia in chronic kidney disease: Secondary | ICD-10-CM | POA: Insufficient documentation

## 2019-08-04 DIAGNOSIS — N189 Chronic kidney disease, unspecified: Secondary | ICD-10-CM | POA: Diagnosis not present

## 2019-08-04 LAB — IRON AND TIBC
Iron: 70 ug/dL (ref 45–182)
Saturation Ratios: 26 % (ref 17.9–39.5)
TIBC: 269 ug/dL (ref 250–450)
UIBC: 199 ug/dL

## 2019-08-04 LAB — HEMOGLOBIN AND HEMATOCRIT, BLOOD
HCT: 38.8 % — ABNORMAL LOW (ref 39.0–52.0)
Hemoglobin: 12 g/dL — ABNORMAL LOW (ref 13.0–17.0)

## 2019-08-04 LAB — FERRITIN: Ferritin: 856 ng/mL — ABNORMAL HIGH (ref 24–336)

## 2019-08-04 MED ORDER — EPOETIN ALFA 10000 UNIT/ML IJ SOLN: 10000.0000 [IU] | Freq: Once | INTRAMUSCULAR | Status: DC

## 2019-08-04 NOTE — Progress Notes (Signed)
Patient's hemoglobin was 12.0. Per order, sub q procrit was not given. Labs for Ferritin, hemoglobin and iron were drawn. Patient alert, oriented and ambulatory on discharge. AVS was given, patient verbalized understanding.

## 2019-08-10 ENCOUNTER — Other Ambulatory Visit (HOSPITAL_COMMUNITY): Payer: Self-pay

## 2019-08-10 NOTE — Progress Notes (Signed)
Paramedicine Encounter    Patient ID: Brent [name], male    DOB: December 13, 1945, 74 y.o.   MRN: [id number]   Patient Care Team: Gayland Curry, DO as PCP - General (Geriatric Medicine) [place], Peter M, MD as PCP - Cardiology (Cardiology) Larey Dresser, MD as PCP - Advanced Heart Failure (Cardiology) Elmarie Shiley, MD (Nephrology) Milus Banister, MD (Gastroenterology)  Patient Active Problem List   Diagnosis Date Noted  . Chronic obstructive pulmonary disease ([place]) 06/20/2019  . Ventricular tachycardia ([place]) 06/20/2019  . Acute on chronic systolic (congestive) heart failure ([place]) 04/25/2019  . Metabolic acidosis 68/34/1962  . Orthostasis 09/07/2018  . AKI (acute kidney injury) ([place])   . Immunosuppressed status ([place])   . Macrocytic anemia   . Chronic combined systolic and diastolic congestive heart failure ([place]) 06/03/2018  . Candida esophagitis ([place]) 09/22/2017  . History of smoking 30 or more pack years 01/05/2017  . Bipolar 1 disorder ([place])   . BPH (benign prostatic hyperplasia) 03/31/2013  . 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 Essential hypertension, benign 03/31/2013  . Acute renal failure superimposed on stage 3 chronic kidney disease ([place]) 06/04/2012  . 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[place]) 01/23/2010    Current Outpatient Medications:  .  allopurinol (ZYLOPRIM) 100 MG tablet, Take 2 tablets (200 mg total) by mouth daily., Disp: 60 tablet, Rfl: 5 .  aspirin EC 81 MG tablet, Take 1 tablet (81 mg total) by mouth daily., Disp: 90 tablet, Rfl: 3 .  Cholecalciferol (VITAMIN D) 125 MCG (5000 UT) CAPS, Take 5,000 Units by mouth daily. , Disp: , Rfl:  .  divalproex (DEPAKOTE ER) 500 MG 24 hr tablet, Take 1,500 mg by mouth at bedtime., Disp: , Rfl:  .  epoetin alfa (PROCRIT) 22979 UNIT/ML injection, Inject 10,000 Units into the skin every 30 (thirty) days. , Disp: , Rfl:  .  ferrous sulfate 325 (65 FE) MG tablet, Take 325 mg by mouth every Monday, Wednesday, and Friday. , Disp:  , Rfl:  .  hydrALAZINE (APRESOLINE) 25 MG tablet, Take 1 tablet (25 mg total) by mouth 2 (two) times daily., Disp: 60 tablet, Rfl: 3 .  inFLIXimab (REMICADE) 100 MG injection, Infuse Remicade IV schedule 1 50m/kg every 8 weeks Premedicate with Tylenol 500-6515mby mouth and Benadryl 25-5034my mouth prior to infusion. Last PPD was on 12/2009. , Disp: 1 each, Rfl: 6 .  isosorbide mononitrate (IMDUR) 30 MG 24 hr tablet, Take 1 tablet (30 mg total) by mouth daily., Disp: 90 tablet, Rfl: 3 .  ivabradine (CORLANOR) 5 MG TABS tablet, Take 1 tablet (5 mg total) by mouth 2 (two) times daily with a meal., Disp: 180 tablet, Rfl: 3 .  loperamide (IMODIUM) 2 MG capsule, Take 1 capsule (2 mg total) by mouth 4 (four) times daily as needed for diarrhea or loose stools., Disp: 12 capsule, Rfl: 0 .  magnesium oxide (MAG-OX) 400 MG tablet, Take 800 mg by mouth in the morning, at noon, and at bedtime. , Disp: , Rfl:  .  metoprolol succinate (TOPROL-XL) 25 MG 24 hr tablet, Take 1 tablet (25 mg total) by mouth at bedtime., Disp: 90 tablet, Rfl: 3 .  OLANZapine (ZYPREXA) 7.5 MG tablet, Take 1 tablet (7.5 mg total) by mouth at bedtime., Disp: 30 tablet, Rfl: 11 .  omeprazole (PRILOSEC) 20 MG capsule, Take 20 mg by mouth daily before breakfast. , Disp: , Rfl:  .  tamsulosin (FLOMAX) 0.4 MG CAPS capsule, Take 1 capsule (0.4 mg  total) by mouth daily., Disp: 30 capsule, Rfl: 2 .  triamcinolone (NASACORT ALLERGY 24HR) 55 MCG/ACT AERO nasal inhaler, Place 2 sprays into the nose daily as needed (allergies)., Disp: , Rfl:  .  vitamin B-12 (CYANOCOBALAMIN) 1000 MCG tablet, Take 1,000 mcg by mouth daily., Disp: , Rfl:  .  mupirocin ointment (BACTROBAN) 2 %, Place 1 application into the nose daily. (Patient not taking: Reported on 08/10/2019), Disp: , Rfl:  Allergies  Allergen Reactions  . Azathioprine Other (See Comments)    REACTION: affected WBC "Almost died"  . Ciprofloxacin Other (See Comments)    Unknown rxn  . Levaquin  [Levofloxacin In D5w] Other (See Comments)    Unknown rxn  . Plendil [Felodipine] Other (See Comments)    Unknown rxn     Social History   Socioeconomic History  . Marital status: Divorced    Spouse name: Not on file  . Number of children: 1  . Years of education: 30  . Highest education level: Not on file  Occupational History  . Occupation: retired  . Occupation: Veteran  Tobacco Use  . Smoking status: Former Smoker    Packs/day: 1.00    Years: 49.00    Pack years: 49.00    Types: Cigarettes    Start date: 04/11/1956    Quit date: 04/17/2014    Years since quitting: 5.3  . Smokeless tobacco: Never Used  Vaping Use  . Vaping Use: Never used  Substance and Sexual Activity  . Alcohol use: No    Alcohol/week: 0.0 standard drinks  . Drug use: No  . Sexual activity: Never  Other Topics Concern  . Not on file  Social History Narrative  . Not on file   Social Determinants of Health   Financial Resource Strain:   . Difficulty of Paying Living Expenses:   Food Insecurity:   . Worried About Charity fundraiser in the Last Year:   . Arboriculturist in the Last Year:   Transportation Needs:   . Film/video editor (Medical):   Marland Kitchen Lack of Transportation (Non-Medical):   Physical Activity:   . Days of Exercise per Week:   . Minutes of Exercise per Session:   Stress:   . Feeling of Stress :   Social Connections:   . Frequency of Communication with Friends and Family:   . Frequency of Social Gatherings with Friends and Family:   . Attends Religious Services:   . Active Member of Clubs or Organizations:   . Attends Archivist Meetings:   Marland Kitchen Marital Status:   Intimate Partner Violence:   . Fear of Current or Ex-Partner:   . Emotionally Abused:   Marland Kitchen Physically Abused:   . Sexually Abused:     Physical Exam Vitals reviewed.  Constitutional:      Appearance: He is normal weight.  HENT:     Head: Normocephalic.     Right Ear: Tympanic membrane normal.      Nose: Nose normal.     Mouth/Throat:     Mouth: Mucous membranes are moist.  Eyes:     Pupils: Pupils are equal, round, and reactive to light.  Cardiovascular:     Rate and Rhythm: Normal rate and regular rhythm.     Pulses: Normal pulses.     Heart sounds: Normal heart sounds.  Pulmonary:     Effort: Pulmonary effort is normal.     Breath sounds: Normal breath sounds.  Abdominal:  General: Abdomen is flat.     Palpations: Abdomen is soft.  Musculoskeletal:        General: Normal range of motion.     Cervical back: Normal range of motion.     Right lower leg: No edema.     Left lower leg: No edema.  Skin:    General: Skin is warm and dry.     Capillary Refill: Capillary refill takes less than 2 seconds.  Neurological:     Mental Status: He is alert. Mental status is at baseline.  Psychiatric:        Mood and Affect: Mood normal.      Arrived for home visit for [name] who was seated in his apartment alert and oriented with no complaints. Vitals obtained. [name] informed me his hemoglobin was good last week at 12 and he did not need his infusion! [name] stated he has been feeling good. Appointments reviewed. Medications confirmed and reviewed. Pill box filled accordingly. Home visit complete. I will see [name] next week with Dr. [name].    Future Appointments  Date Time Provider [place]  08/16/2019 12:00 PM Larey Dresser, MD [facility] None  08/18/2019 10:00 AM [facility] RM 2 [facility] None  10/20/2019 11:00 AM Hollace Kinnier L, DO [facility] None  01/24/2020  2:15 PM [name], [name], NP [facility] None     ACTION: Home visit completed Next visit planned for one week

## 2019-08-16 ENCOUNTER — Other Ambulatory Visit (HOSPITAL_COMMUNITY): Payer: Self-pay | Admitting: *Deleted

## 2019-08-16 ENCOUNTER — Ambulatory Visit (HOSPITAL_COMMUNITY)
Admission: RE | Admit: 2019-08-16 | Discharge: 2019-08-16 | Disposition: A | Discharge status: Home / Self Care | Payer: Medicare Other | Source: Ambulatory Visit | Attending: Cardiology | Admitting: Cardiology

## 2019-08-16 ENCOUNTER — Other Ambulatory Visit: Payer: Self-pay

## 2019-08-16 VITALS — BP 94/58 | HR 72 | Wt 181.2 lb

## 2019-08-16 DIAGNOSIS — Z7982 Long term (current) use of aspirin: Secondary | ICD-10-CM | POA: Diagnosis not present

## 2019-08-16 DIAGNOSIS — Z87891 Personal history of nicotine dependence: Secondary | ICD-10-CM | POA: Insufficient documentation

## 2019-08-16 DIAGNOSIS — I130 Hypertensive heart and chronic kidney disease with heart failure and stage 1 through stage 4 chronic kidney disease, or unspecified chronic kidney disease: Secondary | ICD-10-CM | POA: Insufficient documentation

## 2019-08-16 DIAGNOSIS — Z7901 Long term (current) use of anticoagulants: Secondary | ICD-10-CM | POA: Diagnosis not present

## 2019-08-16 DIAGNOSIS — K5090 Crohn's disease, unspecified, without complications: Secondary | ICD-10-CM | POA: Insufficient documentation

## 2019-08-16 DIAGNOSIS — Z79899 Other long term (current) drug therapy: Secondary | ICD-10-CM | POA: Insufficient documentation

## 2019-08-16 DIAGNOSIS — J449 Chronic obstructive pulmonary disease, unspecified: Secondary | ICD-10-CM | POA: Diagnosis not present

## 2019-08-16 DIAGNOSIS — N183 Chronic kidney disease, stage 3 unspecified: Secondary | ICD-10-CM

## 2019-08-16 DIAGNOSIS — K900 Celiac disease: Secondary | ICD-10-CM | POA: Insufficient documentation

## 2019-08-16 DIAGNOSIS — M109 Gout, unspecified: Secondary | ICD-10-CM | POA: Diagnosis not present

## 2019-08-16 DIAGNOSIS — I5042 Chronic combined systolic (congestive) and diastolic (congestive) heart failure: Secondary | ICD-10-CM | POA: Diagnosis not present

## 2019-08-16 DIAGNOSIS — J439 Emphysema, unspecified: Secondary | ICD-10-CM | POA: Diagnosis not present

## 2019-08-16 DIAGNOSIS — I5022 Chronic systolic (congestive) heart failure: Secondary | ICD-10-CM | POA: Insufficient documentation

## 2019-08-16 DIAGNOSIS — Z7984 Long term (current) use of oral hypoglycemic drugs: Secondary | ICD-10-CM | POA: Insufficient documentation

## 2019-08-16 LAB — BASIC METABOLIC PANEL
Anion gap: 10 (ref 5–15)
BUN: 37 mg/dL — ABNORMAL HIGH (ref 8–23)
CO2: 21 mmol/L — ABNORMAL LOW (ref 22–32)
Calcium: 9.2 mg/dL (ref 8.9–10.3)
Chloride: 112 mmol/L — ABNORMAL HIGH (ref 98–111)
Creatinine, Ser: 2.28 mg/dL — ABNORMAL HIGH (ref 0.61–1.24)
GFR calc Af Amer: 32 mL/min — ABNORMAL LOW (ref >60)
GFR calc non Af Amer: 27 mL/min — ABNORMAL LOW (ref >60)
Glucose, Bld: 87 mg/dL (ref 70–99)
Potassium: 4.5 mmol/L (ref 3.5–5.1)
Sodium: 143 mmol/L (ref 135–145)

## 2019-08-16 MED ORDER — DAPAGLIFLOZIN PROPANEDIOL 10 MG PO TABS
10.0000 mg | ORAL_TABLET | Freq: Every day | ORAL | 6 refills | Status: DC
Start: 2019-08-16 — End: 2019-08-16

## 2019-08-16 MED ORDER — DAPAGLIFLOZIN PROPANEDIOL 10 MG PO TABS: 10.0000 mg | ORAL_TABLET | Freq: Every day | ORAL | 6 refills | Status: DC

## 2019-08-16 NOTE — Progress Notes (Signed)
PCP: Gayland Curry, DO Cardiology: Dr. Leandro Reasoner Brent [name] is a 74 y.o. with a history of Crohn's disease, celiac disease,HTN, COPD,priortobacco use, CKD 3, chronic anemia, and bipolar disorder.Diagnosed with systolic HF in 7/91 (echo with EF 30-35%).   Admitted 3/11-3/16/20 with SOB and hypoxia. Found to have PNA and required BIPAP and antibiotics. Blood cultures were negative. Echo showed newly reduced EF 30-35%. Cardiology consulted and he had nuclear stress test, which was read as "high risk" due to low EF. Per Dr Sallyanne Kuster, did not think changes were indicative of coronary disease and cath was not pursued due this and due to CKD. HF meds optimized. Limited with CKD3. He had one short run of NSVT. Also had AKI on CKD3, but creatinine improved to baseline 1.77 on day of discharge.  He was seen in ED 05/12/18 with SOB after misplacing his nebulizer. BNP was elevated, so he was given 40 mg IV lasix and discharged with lasix 20 mg BID. Referred to HF clinic.  He was admitted again in 7/20 with weakness, orthostatic hypotension, and AKI.  Cardiac meds were held.  He was noted to be severely deconditioned, and he was discharged to SNF.  He remained in SNF x 2 wks and is now home.   Echo was done in 8/20, EF 20-25% with mild LVH and normal RV.  He had RHC in 8/20 showing normal filling pressures and good cardiac output (surprisingly).   LHC/RHC in 3/21 showed no significant CAD, low filling pressures, preserved cardiac output.   He returns today for followup of CHF.  He completed cardiac rehab earlier this year.  Weight is up 2 lbs.  He is walking with a cane for balance, dyspnea only after walking "a long way."  No orthopnea/PND.  He can climb a flight of stairs without much problem.  No chest pain.  No diarrhea, Crohns seems stable. Main complaint is knee and shoulder arthritis.   Labs (7/20): K 5.1, creatinine 2.25 Labs (8/20): K 4.8, creatinine 2.65 Labs (9/20): hgb 9.7 Labs (12/20): K  4.1, creatinine 1.76 Labs (3/21): K 5, creatinine 2.25 Labs (6/21): hgb 12  ECG (personally reviewed): NSR, LVH with repolarization abnormality.   PMH: 1. Crohns disease: Gets Remicade.  2. Celiac disease.  3. COPD: Prior smoker.  - CT chest (7/20) with mild emphysema.  - PFTs (9/20): Mild obstruction, mild restriction.  - High resolution CT chest (10/20): No ILD, +chronic bronchitis.  4. CKD: Stage 3.  5. HTN 6. Bipolar disorder 7. H/o BPPV 8. Gout 9. Chronic systolic CHF: Etiology uncertain.  - Echo (3/20): EF 30-35%, mild LVH, normal RV.  - Cardiolite (3/20): EF 28%, no ischemia, fixed defects in anteroseptal and inferolateral walls.  - Echo (8/20): EF 20-25%, diffuse hypokinesis, mild LVH, normal RV size and systolic function, normal IVC size.  - RHC (8/20): mean RA 3, PA 21/10, mean PCWP 5, CI 3.2 Fick and 3.5 Thermo - RHC/LHC (3/21): No significant CAD; mean RA 1, PA 20/6, mean PCWP 6, CI 4.57  Social History   Socioeconomic History   Marital status: Divorced    Spouse name: Not on file   Number of children: 1   Years of education: 12   Highest education level: Not on file  Occupational History   Occupation: retired   Occupation: English as a second language teacher  Tobacco Use   Smoking status: Former Smoker    Packs/day: 1.00    Years: 49.00    Pack years: 49.00    Types:  Cigarettes    Start date: 04/11/1956    Quit date: 04/17/2014    Years since quitting: 5.3   Smokeless tobacco: Never Used  Vaping Use   Vaping Use: Never used  Substance and Sexual Activity   Alcohol use: No    Alcohol/week: 0.0 standard drinks   Drug use: No   Sexual activity: Never  Other Topics Concern   Not on file  Social History Narrative   Not on file   Social Determinants of Health   Financial Resource Strain:    Difficulty of Paying Living Expenses:   Food Insecurity:    Worried About Charity fundraiser in the Last Year:    Arboriculturist in the Last Year:   Transportation  Needs:    Film/video editor (Medical):    Lack of Transportation (Non-Medical):   Physical Activity:    Days of Exercise per Week:    Minutes of Exercise per Session:   Stress:    Feeling of Stress :   Social Connections:    Frequency of Communication with Friends and Family:    Frequency of Social Gatherings with Friends and Family:    Attends Religious Services:    Active Member of Clubs or Organizations:    Attends Music therapist:    Marital Status:   Intimate Partner Violence:    Fear of Current or Ex-Partner:    Emotionally Abused:    Physically Abused:    Sexually Abused:    Family History  Problem Relation Age of Onset   Diabetes Mother        maternal grandmother   Uterine cancer Mother    Emphysema Father    Pneumonia Maternal Grandmother    Colon cancer Neg Hx    ROS: All systems reviewed and negative except as per HPI.   Current Outpatient Medications  Medication Sig Dispense Refill   allopurinol (ZYLOPRIM) 100 MG tablet Take 2 tablets (200 mg total) by mouth daily. 60 tablet 5   aspirin EC 81 MG tablet Take 1 tablet (81 mg total) by mouth daily. 90 tablet 3   Cholecalciferol (VITAMIN D) 125 MCG (5000 UT) CAPS Take 5,000 Units by mouth daily.      divalproex (DEPAKOTE ER) 500 MG 24 hr tablet Take 1,500 mg by mouth at bedtime.     epoetin alfa (PROCRIT) 71245 UNIT/ML injection Inject 10,000 Units into the skin every 30 (thirty) days.      ferrous sulfate 325 (65 FE) MG tablet Take 325 mg by mouth every Monday, Wednesday, and Friday.      hydrALAZINE (APRESOLINE) 25 MG tablet Take 1 tablet (25 mg total) by mouth 2 (two) times daily. 60 tablet 3   inFLIXimab (REMICADE) 100 MG injection Infuse Remicade IV schedule 1 80m/kg every 8 weeks Premedicate with Tylenol 500-6540mby mouth and Benadryl 25-5067my mouth prior to infusion. Last PPD was on 12/2009.  1 each 6   isosorbide mononitrate (IMDUR) 30 MG 24 hr tablet  Take 1 tablet (30 mg total) by mouth daily. 90 tablet 3   ivabradine (CORLANOR) 5 MG TABS tablet Take 1 tablet (5 mg total) by mouth 2 (two) times daily with a meal. 180 tablet 3   loperamide (IMODIUM) 2 MG capsule Take 1 capsule (2 mg total) by mouth 4 (four) times daily as needed for diarrhea or loose stools. 12 capsule 0   magnesium oxide (MAG-OX) 400 MG tablet Take 800 mg by mouth in the  morning, at noon, and at bedtime.      metoprolol succinate (TOPROL-XL) 25 MG 24 hr tablet Take 1 tablet (25 mg total) by mouth at bedtime. 90 tablet 3   OLANZapine (ZYPREXA) 7.5 MG tablet Take 1 tablet (7.5 mg total) by mouth at bedtime. 30 tablet 11   omeprazole (PRILOSEC) 20 MG capsule Take 20 mg by mouth daily before breakfast.      tamsulosin (FLOMAX) 0.4 MG CAPS capsule Take 1 capsule (0.4 mg total) by mouth daily. 30 capsule 2   triamcinolone (NASACORT ALLERGY 24HR) 55 MCG/ACT AERO nasal inhaler Place 2 sprays into the nose daily as needed (allergies).     vitamin B-12 (CYANOCOBALAMIN) 1000 MCG tablet Take 1,000 mcg by mouth daily.     dapagliflozin propanediol (FARXIGA) 10 MG TABS tablet Take 1 tablet (10 mg total) by mouth daily before breakfast. 30 tablet 6   No current facility-administered medications for this encounter.   BP (!) 94/58    Pulse 72    Wt 82.2 kg (181 lb 4 oz)    SpO2 96%    BMI 23.78 kg/m  General: NAD Neck: No JVD, no thyromegaly or thyroid nodule.  Lungs: Clear to auscultation bilaterally with normal respiratory effort. CV: Nondisplaced PMI.  Heart regular S1/S2, no S3/S4, no murmur.  No peripheral edema.  No carotid bruit.  Normal pedal pulses.  Abdomen: Soft, nontender, no hepatosplenomegaly, no distention.  Skin: Intact without lesions or rashes.  Neurologic: Alert and oriented x 3.  Psych: Normal affect. Extremities: No clubbing or cyanosis.  HEENT: Normal.   Assessment/Plan: 1. Chronic systolic CHF: Nonischemic cardiomyoapthy.  Diagnosed by echo in 3/20, EF  30-35%.  LHC in 3/21 with no significant CAD. He has no chest pain.  Medication titration has been limited by orthostasis/low BP.  Echo in 8/20 showed EF 20-25% with mild LVH and normal RV function.  With resting sinus tachycardia, I was concerned for low output HF, but RHC in 8/20 and again in 3/21 showed normal cardiac output and filling pressures. Today, NYHA class II (improved) with no volume overload by exam.    - Continue current hydralazine/Imdur, no BP room to titrate.  - Continue Toprol XL 25 mg daily, take in evening before bed. - Continue ivabradine 5 mg bid, HR is now controlled.   - Not taking Lasix.  - No ACEI/ARB/ARNI with low BP and elevated creatinine.  - Start Farxiga 10 mg daily. BMET today and in 10 days.  - If creatinine remains stable and K runs lower, would add spironolactone next.  - Will repeat echo after next appointment.  If EF remains low, would consider ICD placement (not CRT candidate with narrow QRS).  - He would be a difficult candidate for advanced therapies with deconditioning, renal dysfunction, and lives alone.  2. CKD: Stage 3.  BMET today.  3. COPD: Mild emphysema on prior chest CT.  PFTs in 9/20 showed mild obstruction and mild restriction.  High resolution CT chest 10/20 showed no ILD, +chronic bronchitis.  - Repeat CT in 10/21 to follow small lung nodule given smoking history.   Followup 3 months.   Loralie Champagne 08/16/2019

## 2019-08-16 NOTE — Patient Instructions (Signed)
Start Farxiga 10 mg daily  Labs done today, we will call you for abnormal results  Labs needed again in 10 days  Your physician recommends that you schedule a follow-up appointment in: 3 months  If you have any questions or concerns before your next appointment please send [name] a message through [place] or call our office at [phone].    TO LEAVE A MESSAGE FOR THE NURSE SELECT OPTION 2, PLEASE LEAVE A MESSAGE INCLUDING: . YOUR NAME . DATE OF BIRTH . CALL BACK NUMBER . REASON FOR CALL**this is important as we prioritize the call backs  [place] AS LONG AS YOU CALL BEFORE 4:00 PM  At the [facility], you and your health needs are our priority. As part of our continuing mission to provide you with exceptional heart care, we have created designated Provider Care Teams. These Care Teams include your primary Cardiologist (physician) and Advanced Practice Providers (APPs- Physician Assistants and Nurse Practitioners) who all work together to provide you with the care you need, when you need it.   You may see any of the following providers on your designated Care Team at your next follow up: Marland Kitchen Dr Glori Bickers . Dr Loralie Champagne . Darrick Grinder, NP . Lyda Jester, PA . Audry Riles, PharmD   Please be sure to bring in all your medications bottles to every appointment.

## 2019-08-17 ENCOUNTER — Other Ambulatory Visit (HOSPITAL_COMMUNITY): Payer: Self-pay

## 2019-08-17 NOTE — Progress Notes (Signed)
Paramedicine Encounter    Patient ID: Brent [name], male    DOB: 01/04/1946, 74 y.o.   MRN: [id number]   Patient Care Team: Gayland Curry, DO as PCP - General (Geriatric Medicine) [place], Peter M, MD as PCP - Cardiology (Cardiology) Larey Dresser, MD as PCP - Advanced Heart Failure (Cardiology) Elmarie Shiley, MD (Nephrology) Milus Banister, MD (Gastroenterology)  Patient Active Problem List   Diagnosis Date Noted  . Chronic obstructive pulmonary disease ([place]) 06/20/2019  . Ventricular tachycardia ([place]) 06/20/2019  . Acute on chronic systolic (congestive) heart failure ([place]) 04/25/2019  . Metabolic acidosis 30/16/0109  . Orthostasis 09/07/2018  . AKI (acute kidney injury) ([place])   . Immunosuppressed status ([place])   . Macrocytic anemia   . Chronic combined systolic and diastolic congestive heart failure ([place]) 06/03/2018  . Candida esophagitis ([place]) 09/22/2017  . History of smoking 30 or more pack years 01/05/2017  . Bipolar 1 disorder ([place])   . BPH (benign prostatic hyperplasia) 03/31/2013  . 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 Essential hypertension, benign 03/31/2013  . Acute renal failure superimposed on stage 3 chronic kidney disease ([place]) 06/04/2012  . 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  allopurinol (ZYLOPRIM) 100 MG tablet, Take 2 tablets (200 mg total) by mouth daily., Disp: 60 tablet, Rfl: 5 .  aspirin EC 81 MG tablet, Take 1 tablet (81 mg total) by mouth daily., Disp: 90 tablet, Rfl: 3 .  divalproex (DEPAKOTE ER) 500 MG 24 hr tablet, Take 1,500 mg by mouth at bedtime., Disp: , Rfl:  .  epoetin alfa (PROCRIT) 32355 UNIT/ML injection, Inject 10,000 Units into the skin every 30 (thirty) days. , Disp: , Rfl:  .  ferrous sulfate 325 (65 FE) MG tablet, Take 325 mg by mouth every Monday, Wednesday, and Friday. , Disp: , Rfl:  .  hydrALAZINE (APRESOLINE) 25 MG tablet, Take 1 tablet (25 mg total) by mouth 2 (two) times  daily., Disp: 60 tablet, Rfl: 3 .  inFLIXimab (REMICADE) 100 MG injection, Infuse Remicade IV schedule 1 19m/kg every 8 weeks Premedicate with Tylenol 500-6578mby mouth and Benadryl 25-5036my mouth prior to infusion. Last PPD was on 12/2009. , Disp: 1 each, Rfl: 6 .  isosorbide mononitrate (IMDUR) 30 MG 24 hr tablet, Take 1 tablet (30 mg total) by mouth daily., Disp: 90 tablet, Rfl: 3 .  ivabradine (CORLANOR) 5 MG TABS tablet, Take 1 tablet (5 mg total) by mouth 2 (two) times daily with a meal., Disp: 180 tablet, Rfl: 3 .  loperamide (IMODIUM) 2 MG capsule, Take 1 capsule (2 mg total) by mouth 4 (four) times daily as needed for diarrhea or loose stools., Disp: 12 capsule, Rfl: 0 .  magnesium oxide (MAG-OX) 400 MG tablet, Take 800 mg by mouth in the morning, at noon, and at bedtime. , Disp: , Rfl:  .  metoprolol succinate (TOPROL-XL) 25 MG 24 hr tablet, Take 1 tablet (25 mg total) by mouth at bedtime., Disp: 90 tablet, Rfl: 3 .  OLANZapine (ZYPREXA) 7.5 MG tablet, Take 1 tablet (7.5 mg total) by mouth at bedtime., Disp: 30 tablet, Rfl: 11 .  omeprazole (PRILOSEC) 20 MG capsule, Take 20 mg by mouth daily before breakfast. , Disp: , Rfl:  .  tamsulosin (FLOMAX) 0.4 MG CAPS capsule, Take 1 capsule (0.4 mg total) by mouth daily., Disp: 30 capsule, Rfl: 2 .  triamcinolone (NASACORT ALLERGY 24HR) 55 MCG/ACT AERO nasal inhaler, Place  2 sprays into the nose daily as needed (allergies)., Disp: , Rfl:  .  vitamin B-12 (CYANOCOBALAMIN) 1000 MCG tablet, Take 1,000 mcg by mouth daily., Disp: , Rfl:  .  Cholecalciferol (VITAMIN D) 125 MCG (5000 UT) CAPS, Take 5,000 Units by mouth daily. , Disp: , Rfl:  .  dapagliflozin propanediol (FARXIGA) 10 MG TABS tablet, Take 1 tablet (10 mg total) by mouth daily before breakfast., Disp: 30 tablet, Rfl: 6 Allergies  Allergen Reactions  . Azathioprine Other (See Comments)    REACTION: affected WBC "Almost died"  . Ciprofloxacin Other (See Comments)    Unknown rxn  .  Levaquin [Levofloxacin In D5w] Other (See Comments)    Unknown rxn  . Plendil [Felodipine] Other (See Comments)    Unknown rxn     Social History   Socioeconomic History  . Marital status: Divorced    Spouse name: Not on file  . Number of children: 1  . Years of education: 37  . Highest education level: Not on file  Occupational History  . Occupation: retired  . Occupation: Veteran  Tobacco Use  . Smoking status: Former Smoker    Packs/day: 1.00    Years: 49.00    Pack years: 49.00    Types: Cigarettes    Start date: 04/11/1956    Quit date: 04/17/2014    Years since quitting: 5.3  . Smokeless tobacco: Never Used  Vaping Use  . Vaping Use: Never used  Substance and Sexual Activity  . Alcohol use: No    Alcohol/week: 0.0 standard drinks  . Drug use: No  . Sexual activity: Never  Other Topics Concern  . Not on file  Social History Narrative  . Not on file   Social Determinants of Health   Financial Resource Strain:   . Difficulty of Paying Living Expenses:   Food Insecurity:   . Worried About Charity fundraiser in the Last Year:   . Arboriculturist in the Last Year:   Transportation Needs:   . Film/video editor (Medical):   Marland Kitchen Lack of Transportation (Non-Medical):   Physical Activity:   . Days of Exercise per Week:   . Minutes of Exercise per Session:   Stress:   . Feeling of Stress :   Social Connections:   . Frequency of Communication with Friends and Family:   . Frequency of Social Gatherings with Friends and Family:   . Attends Religious Services:   . Active Member of Clubs or Organizations:   . Attends Archivist Meetings:   Marland Kitchen Marital Status:   Intimate Partner Violence:   . Fear of Current or Ex-Partner:   . Emotionally Abused:   Marland Kitchen Physically Abused:   . Sexually Abused:     Physical Exam Vitals reviewed.  Constitutional:      Appearance: He is normal weight.  HENT:     Head: Normocephalic.     Nose: Nose normal.      Mouth/Throat:     Mouth: Mucous membranes are dry.  Eyes:     Pupils: Pupils are equal, round, and reactive to light.  Cardiovascular:     Rate and Rhythm: Normal rate and regular rhythm.     Pulses: Normal pulses.     Heart sounds: Normal heart sounds.  Pulmonary:     Effort: Pulmonary effort is normal.     Breath sounds: Normal breath sounds.  Abdominal:     General: Abdomen is flat.  Palpations: Abdomen is soft.  Musculoskeletal:        General: Normal range of motion.     Cervical back: Normal range of motion.     Right lower leg: No edema.     Left lower leg: No edema.  Skin:    General: Skin is warm and dry.     Capillary Refill: Capillary refill takes less than 2 seconds.  Neurological:     Mental Status: He is alert. Mental status is at baseline.  Psychiatric:        Mood and Affect: Mood normal.     Arrived for home visit for Careem who was seated alert and oriented with no complaints reporting he feels good. Levaughn saw Dr. [name] yesterday and [name] was added to list, patient will be picking up same today. I left instructions as to where it goes in pill box, patient agreed and understood. Vitals obtained. Medications reviewed and verified. Pill box filled accordingly. Medardo agreed to visit in one week. Home visit complete.   Refills- NONE Weight today 177lbs  Patient made aware of upcoming appointments.  Future Appointments  Date Time Provider [place]  08/18/2019 10:00 AM [facility] RM 2 [facility] None  08/25/2019  1:45 PM [facility] LAB [facility] None  10/20/2019 11:00 AM Hollace Kinnier L, DO [facility] None  11/14/2019  1:30 PM [facility] PA/NP [facility] None  01/24/2020  2:15 PM [name], [name], NP [facility] None     ACTION: Home visit completed Next visit planned for one week

## 2019-08-18 ENCOUNTER — Non-Acute Institutional Stay (HOSPITAL_COMMUNITY)
Admission: RE | Admit: 2019-08-18 | Discharge: 2019-08-18 | Disposition: A | Discharge status: Home / Self Care | Payer: Medicare Other | Source: Ambulatory Visit | Attending: Internal Medicine | Admitting: Internal Medicine

## 2019-08-18 ENCOUNTER — Other Ambulatory Visit: Payer: Self-pay

## 2019-08-18 DIAGNOSIS — N189 Chronic kidney disease, unspecified: Secondary | ICD-10-CM | POA: Diagnosis not present

## 2019-08-18 DIAGNOSIS — D631 Anemia in chronic kidney disease: Secondary | ICD-10-CM | POA: Diagnosis not present

## 2019-08-18 LAB — HEMOGLOBIN AND HEMATOCRIT, BLOOD
HCT: 34.4 % — ABNORMAL LOW (ref 39.0–52.0)
Hemoglobin: 10.4 g/dL — ABNORMAL LOW (ref 13.0–17.0)

## 2019-08-18 MED ORDER — EPOETIN ALFA 10000 UNIT/ML IJ SOLN
10000.0000 [IU] | Freq: Once | INTRAMUSCULAR | Status: AC
  Administered 2019-08-18: 10000 [IU] via SUBCUTANEOUS
  Filled 2019-08-18: qty 1

## 2019-08-18 NOTE — Discharge Instructions (Signed)

## 2019-08-18 NOTE — Progress Notes (Signed)
PATIENT CARE CENTER NOTE   Provider: Elmarie Shiley MD   Procedure: Epoetin (procrit) injection   Note: Patient received 10,000 units of sub-q  Procrit injection in the left arm. Labs were drawn pre injection and hemoglobin was 10.4.  Discharge instructions given, patient alert, oriented and ambulatory at discharge.

## 2019-08-24 ENCOUNTER — Other Ambulatory Visit (HOSPITAL_COMMUNITY): Payer: Self-pay

## 2019-08-24 NOTE — Progress Notes (Signed)
Paramedicine Encounter    Patient ID: Brent [name], male    DOB: 08-Apr-1945, 74 y.o.   MRN: [id number]   Patient Care Team: Gayland Curry, DO as PCP - General (Geriatric Medicine) Martinique, Peter M, MD as PCP - Cardiology (Cardiology) Larey Dresser, MD as PCP - Advanced Heart Failure (Cardiology) Elmarie Shiley, MD (Nephrology) Milus Banister, MD (Gastroenterology)  Patient Active Problem List   Diagnosis Date Noted  . Chronic obstructive pulmonary disease ([place]) 06/20/2019  . Ventricular tachycardia ([place]) 06/20/2019  . Acute on chronic systolic (congestive) heart failure ([place]) 04/25/2019  . Metabolic acidosis 51/76/1607  . Orthostasis 09/07/2018  . AKI (acute kidney injury) ([place])   . Immunosuppressed status ([place])   . Macrocytic anemia   . Chronic combined systolic and diastolic congestive heart failure ([place]) 06/03/2018  . Candida esophagitis ([place]) 09/22/2017  . History of smoking 30 or more pack years 01/05/2017  . Bipolar 1 disorder ([place])   . BPH (benign prostatic hyperplasia) 03/31/2013  . 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 Essential hypertension, benign 03/31/2013  . Acute renal failure superimposed on stage 3 chronic kidney disease ([place]) 06/04/2012  . 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[place]) 01/23/2010    Current Outpatient Medications:  .  allopurinol (ZYLOPRIM) 100 MG tablet, Take 2 tablets (200 mg total) by mouth daily., Disp: 60 tablet, Rfl: 5 .  aspirin EC 81 MG tablet, Take 1 tablet (81 mg total) by mouth daily., Disp: 90 tablet, Rfl: 3 .  Cholecalciferol (VITAMIN D) 125 MCG (5000 UT) CAPS, Take 5,000 Units by mouth daily. , Disp: , Rfl:  .  dapagliflozin propanediol (FARXIGA) 10 MG TABS tablet, Take 1 tablet (10 mg total) by mouth daily before breakfast., Disp: 30 tablet, Rfl: 6 .  divalproex (DEPAKOTE ER) 500 MG 24 hr tablet, Take 1,500 mg by mouth at bedtime., Disp: , Rfl:  .  epoetin alfa (PROCRIT) 37106 UNIT/ML injection, Inject 10,000 Units into the skin  every 30 (thirty) days. , Disp: , Rfl:  .  ferrous sulfate 325 (65 FE) MG tablet, Take 325 mg by mouth every Monday, Wednesday, and Friday. , Disp: , Rfl:  .  hydrALAZINE (APRESOLINE) 25 MG tablet, Take 1 tablet (25 mg total) by mouth 2 (two) times daily., Disp: 60 tablet, Rfl: 3 .  inFLIXimab (REMICADE) 100 MG injection, Infuse Remicade IV schedule 1 75m/kg every 8 weeks Premedicate with Tylenol 500-6557mby mouth and Benadryl 25-5029my mouth prior to infusion. Last PPD was on 12/2009. , Disp: 1 each, Rfl: 6 .  isosorbide mononitrate (IMDUR) 30 MG 24 hr tablet, Take 1 tablet (30 mg total) by mouth daily., Disp: 90 tablet, Rfl: 3 .  ivabradine (CORLANOR) 5 MG TABS tablet, Take 1 tablet (5 mg total) by mouth 2 (two) times daily with a meal., Disp: 180 tablet, Rfl: 3 .  loperamide (IMODIUM) 2 MG capsule, Take 1 capsule (2 mg total) by mouth 4 (four) times daily as needed for diarrhea or loose stools., Disp: 12 capsule, Rfl: 0 .  magnesium oxide (MAG-OX) 400 MG tablet, Take 800 mg by mouth in the morning, at noon, and at bedtime. , Disp: , Rfl:  .  metoprolol succinate (TOPROL-XL) 25 MG 24 hr tablet, Take 1 tablet (25 mg total) by mouth at bedtime., Disp: 90 tablet, Rfl: 3 .  OLANZapine (ZYPREXA) 7.5 MG tablet, Take 1 tablet (7.5 mg total) by mouth at bedtime., Disp: 30 tablet, Rfl: 11 .  omeprazole (PRILOSEC) 20 MG capsule, Take  20 mg by mouth daily before breakfast. , Disp: , Rfl:  .  tamsulosin (FLOMAX) 0.4 MG CAPS capsule, Take 1 capsule (0.4 mg total) by mouth daily., Disp: 30 capsule, Rfl: 2 .  triamcinolone (NASACORT ALLERGY 24HR) 55 MCG/ACT AERO nasal inhaler, Place 2 sprays into the nose daily as needed (allergies)., Disp: , Rfl:  .  vitamin B-12 (CYANOCOBALAMIN) 1000 MCG tablet, Take 1,000 mcg by mouth daily., Disp: , Rfl:  Allergies  Allergen Reactions  . Azathioprine Other (See Comments)    REACTION: affected WBC "Almost died"  . Ciprofloxacin Other (See Comments)    Unknown rxn  .  Levaquin [Levofloxacin In D5w] Other (See Comments)    Unknown rxn  . Plendil [Felodipine] Other (See Comments)    Unknown rxn     Social History   Socioeconomic History  . Marital status: Divorced    Spouse name: Not on file  . Number of children: 1  . Years of education: 52  . Highest education level: Not on file  Occupational History  . Occupation: retired  . Occupation: Veteran  Tobacco Use  . Smoking status: Former Smoker    Packs/day: 1.00    Years: 49.00    Pack years: 49.00    Types: Cigarettes    Start date: 04/11/1956    Quit date: 04/17/2014    Years since quitting: 5.3  . Smokeless tobacco: Never Used  Vaping Use  . Vaping Use: Never used  Substance and Sexual Activity  . Alcohol use: No    Alcohol/week: 0.0 standard drinks  . Drug use: No  . Sexual activity: Never  Other Topics Concern  . Not on file  Social History Narrative  . Not on file   Social Determinants of Health   Financial Resource Strain:   . Difficulty of Paying Living Expenses:   Food Insecurity:   . Worried About Charity fundraiser in the Last Year:   . Arboriculturist in the Last Year:   Transportation Needs:   . Film/video editor (Medical):   Marland Kitchen Lack of Transportation (Non-Medical):   Physical Activity:   . Days of Exercise per Week:   . Minutes of Exercise per Session:   Stress:   . Feeling of Stress :   Social Connections:   . Frequency of Communication with Friends and Family:   . Frequency of Social Gatherings with Friends and Family:   . Attends Religious Services:   . Active Member of Clubs or Organizations:   . Attends Archivist Meetings:   Marland Kitchen Marital Status:   Intimate Partner Violence:   . Fear of Current or Ex-Partner:   . Emotionally Abused:   Marland Kitchen Physically Abused:   . Sexually Abused:     Physical Exam Vitals reviewed.  Constitutional:      Appearance: He is normal weight.  HENT:     Head: Normocephalic.     Nose: Nose normal.      Mouth/Throat:     Mouth: Mucous membranes are moist.  Eyes:     General:        Left eye: Discharge present.    Pupils: Pupils are equal, round, and reactive to light.  Cardiovascular:     Rate and Rhythm: Normal rate and regular rhythm.     Pulses: Normal pulses.     Heart sounds: Normal heart sounds.  Pulmonary:     Effort: Pulmonary effort is normal.     Breath sounds: Normal  breath sounds.  Abdominal:     General: Abdomen is flat.     Palpations: Abdomen is soft.  Musculoskeletal:        General: Normal range of motion.     Cervical back: Normal range of motion.     Right lower leg: No edema.     Left lower leg: No edema.  Skin:    General: Skin is warm and dry.     Capillary Refill: Capillary refill takes less than 2 seconds.  Neurological:     Mental Status: He is alert. Mental status is at baseline.  Psychiatric:        Mood and Affect: Mood normal.    Arrived for home visit for Brent [name] who was alert and oriented seated in his living room reporting his left eye was bothering him with some discharge and redness. He reports using some lubricant drops and it helping some but not completely. I advised him to try some OTC drops and if no improvements in a few days to seek care at an Urgent Care. He agreed with same. Vitals obtained. Medications reviewed ad confirmed. Pill box filled accordingly. Patient had no complaints of dizziness, shortness of breath, cough, chest pain, nausea, vomiting or diarrhea. I will see patient in one week.   REFILLS: [facility]      Future Appointments  Date Time Provider [place]  08/25/2019  1:45 PM [facility] LAB [facility] None  09/16/2019 10:00 AM [facility] RM 2 [facility] None  10/20/2019 11:00 AM Reed, Tiffany L, DO [facility] None  11/14/2019  1:30 PM [facility] PA/NP [facility] None  01/24/2020  2:15 PM [name], [name], NP [facility] None     ACTION: Home visit completed Next visit planned for ONE WEEK

## 2019-08-25 ENCOUNTER — Ambulatory Visit (HOSPITAL_COMMUNITY)
Admission: RE | Admit: 2019-08-25 | Discharge: 2019-08-25 | Disposition: A | Discharge status: Home / Self Care | Payer: Medicare Other | Source: Ambulatory Visit | Attending: Internal Medicine | Admitting: Internal Medicine

## 2019-08-25 ENCOUNTER — Other Ambulatory Visit: Payer: Self-pay

## 2019-08-25 DIAGNOSIS — K5090 Crohn's disease, unspecified, without complications: Secondary | ICD-10-CM | POA: Diagnosis not present

## 2019-08-25 DIAGNOSIS — J8410 Pulmonary fibrosis, unspecified: Secondary | ICD-10-CM | POA: Diagnosis not present

## 2019-08-25 DIAGNOSIS — G259 Extrapyramidal and movement disorder, unspecified: Secondary | ICD-10-CM | POA: Diagnosis not present

## 2019-08-25 DIAGNOSIS — E11649 Type 2 diabetes mellitus with hypoglycemia without coma: Secondary | ICD-10-CM | POA: Diagnosis not present

## 2019-08-25 DIAGNOSIS — N179 Acute kidney failure, unspecified: Secondary | ICD-10-CM | POA: Diagnosis not present

## 2019-08-25 DIAGNOSIS — Z825 Family history of asthma and other chronic lower respiratory diseases: Secondary | ICD-10-CM | POA: Diagnosis not present

## 2019-08-25 DIAGNOSIS — I5022 Chronic systolic (congestive) heart failure: Secondary | ICD-10-CM | POA: Diagnosis not present

## 2019-08-25 DIAGNOSIS — I5042 Chronic combined systolic (congestive) and diastolic (congestive) heart failure: Secondary | ICD-10-CM

## 2019-08-25 DIAGNOSIS — E1122 Type 2 diabetes mellitus with diabetic chronic kidney disease: Secondary | ICD-10-CM | POA: Diagnosis not present

## 2019-08-25 DIAGNOSIS — Z7982 Long term (current) use of aspirin: Secondary | ICD-10-CM | POA: Diagnosis not present

## 2019-08-25 DIAGNOSIS — R002 Palpitations: Secondary | ICD-10-CM | POA: Diagnosis not present

## 2019-08-25 DIAGNOSIS — Z833 Family history of diabetes mellitus: Secondary | ICD-10-CM | POA: Diagnosis not present

## 2019-08-25 DIAGNOSIS — Z883 Allergy status to other anti-infective agents status: Secondary | ICD-10-CM | POA: Diagnosis not present

## 2019-08-25 DIAGNOSIS — Z888 Allergy status to other drugs, medicaments and biological substances status: Secondary | ICD-10-CM | POA: Diagnosis not present

## 2019-08-25 DIAGNOSIS — J449 Chronic obstructive pulmonary disease, unspecified: Secondary | ICD-10-CM | POA: Diagnosis not present

## 2019-08-25 DIAGNOSIS — Z20822 Contact with and (suspected) exposure to covid-19: Secondary | ICD-10-CM | POA: Diagnosis not present

## 2019-08-25 DIAGNOSIS — R42 Dizziness and giddiness: Secondary | ICD-10-CM | POA: Diagnosis not present

## 2019-08-25 DIAGNOSIS — N1832 Chronic kidney disease, stage 3b: Secondary | ICD-10-CM | POA: Diagnosis not present

## 2019-08-25 DIAGNOSIS — K50918 Crohn's disease, unspecified, with other complication: Secondary | ICD-10-CM | POA: Diagnosis not present

## 2019-08-25 DIAGNOSIS — M109 Gout, unspecified: Secondary | ICD-10-CM | POA: Diagnosis not present

## 2019-08-25 DIAGNOSIS — N189 Chronic kidney disease, unspecified: Secondary | ICD-10-CM | POA: Diagnosis not present

## 2019-08-25 DIAGNOSIS — I129 Hypertensive chronic kidney disease with stage 1 through stage 4 chronic kidney disease, or unspecified chronic kidney disease: Secondary | ICD-10-CM | POA: Diagnosis not present

## 2019-08-25 DIAGNOSIS — Z87891 Personal history of nicotine dependence: Secondary | ICD-10-CM | POA: Diagnosis not present

## 2019-08-25 DIAGNOSIS — E875 Hyperkalemia: Secondary | ICD-10-CM | POA: Diagnosis not present

## 2019-08-25 DIAGNOSIS — Z881 Allergy status to other antibiotic agents status: Secondary | ICD-10-CM | POA: Diagnosis not present

## 2019-08-25 DIAGNOSIS — I130 Hypertensive heart and chronic kidney disease with heart failure and stage 1 through stage 4 chronic kidney disease, or unspecified chronic kidney disease: Secondary | ICD-10-CM | POA: Diagnosis not present

## 2019-08-25 LAB — BASIC METABOLIC PANEL
Anion gap: 6 (ref 5–15)
BUN: 26 mg/dL — ABNORMAL HIGH (ref 8–23)
CO2: 26 mmol/L (ref 22–32)
Calcium: 8.7 mg/dL — ABNORMAL LOW (ref 8.9–10.3)
Chloride: 105 mmol/L (ref 98–111)
Creatinine, Ser: 2.52 mg/dL — ABNORMAL HIGH (ref 0.61–1.24)
GFR calc Af Amer: 28 mL/min — ABNORMAL LOW (ref >60)
GFR calc non Af Amer: 24 mL/min — ABNORMAL LOW (ref >60)
Glucose, Bld: 89 mg/dL (ref 70–99)
Potassium: 6.1 mmol/L — ABNORMAL HIGH (ref 3.5–5.1)
Sodium: 137 mmol/L (ref 135–145)

## 2019-08-26 ENCOUNTER — Encounter (HOSPITAL_COMMUNITY): Payer: Self-pay

## 2019-08-26 ENCOUNTER — Ambulatory Visit (HOSPITAL_COMMUNITY)
Admission: RE | Admit: 2019-08-26 | Discharge: 2019-08-26 | Disposition: A | Discharge status: Home / Self Care | Payer: Medicare Other | Source: Ambulatory Visit | Attending: Internal Medicine | Admitting: Internal Medicine

## 2019-08-26 ENCOUNTER — Other Ambulatory Visit: Payer: Self-pay

## 2019-08-26 ENCOUNTER — Telehealth (HOSPITAL_COMMUNITY): Payer: Self-pay

## 2019-08-26 ENCOUNTER — Inpatient Hospital Stay (HOSPITAL_COMMUNITY)
Admission: EM | Admit: 2019-08-26 | Discharge: 2019-08-28 | DRG: 641 | Disposition: A | Discharge status: Home / Self Care | Payer: Medicare Other | Attending: Family Medicine | Admitting: Family Medicine

## 2019-08-26 DIAGNOSIS — E11649 Type 2 diabetes mellitus with hypoglycemia without coma: Secondary | ICD-10-CM | POA: Diagnosis not present

## 2019-08-26 DIAGNOSIS — I5022 Chronic systolic (congestive) heart failure: Secondary | ICD-10-CM | POA: Diagnosis present

## 2019-08-26 DIAGNOSIS — I130 Hypertensive heart and chronic kidney disease with heart failure and stage 1 through stage 4 chronic kidney disease, or unspecified chronic kidney disease: Secondary | ICD-10-CM | POA: Diagnosis present

## 2019-08-26 DIAGNOSIS — Z888 Allergy status to other drugs, medicaments and biological substances status: Secondary | ICD-10-CM

## 2019-08-26 DIAGNOSIS — F419 Anxiety disorder, unspecified: Secondary | ICD-10-CM | POA: Diagnosis present

## 2019-08-26 DIAGNOSIS — M109 Gout, unspecified: Secondary | ICD-10-CM | POA: Diagnosis present

## 2019-08-26 DIAGNOSIS — Z20822 Contact with and (suspected) exposure to covid-19: Secondary | ICD-10-CM | POA: Diagnosis present

## 2019-08-26 DIAGNOSIS — N183 Chronic kidney disease, stage 3 unspecified: Secondary | ICD-10-CM

## 2019-08-26 DIAGNOSIS — H8110 Benign paroxysmal vertigo, unspecified ear: Secondary | ICD-10-CM | POA: Diagnosis present

## 2019-08-26 DIAGNOSIS — N1832 Chronic kidney disease, stage 3b: Secondary | ICD-10-CM | POA: Diagnosis present

## 2019-08-26 DIAGNOSIS — Z8049 Family history of malignant neoplasm of other genital organs: Secondary | ICD-10-CM

## 2019-08-26 DIAGNOSIS — N189 Chronic kidney disease, unspecified: Secondary | ICD-10-CM

## 2019-08-26 DIAGNOSIS — Z7982 Long term (current) use of aspirin: Secondary | ICD-10-CM

## 2019-08-26 DIAGNOSIS — E1122 Type 2 diabetes mellitus with diabetic chronic kidney disease: Secondary | ICD-10-CM | POA: Diagnosis present

## 2019-08-26 DIAGNOSIS — J449 Chronic obstructive pulmonary disease, unspecified: Secondary | ICD-10-CM | POA: Diagnosis present

## 2019-08-26 DIAGNOSIS — N529 Male erectile dysfunction, unspecified: Secondary | ICD-10-CM | POA: Diagnosis present

## 2019-08-26 DIAGNOSIS — D631 Anemia in chronic kidney disease: Secondary | ICD-10-CM | POA: Diagnosis present

## 2019-08-26 DIAGNOSIS — G259 Extrapyramidal and movement disorder, unspecified: Secondary | ICD-10-CM | POA: Diagnosis present

## 2019-08-26 DIAGNOSIS — Z833 Family history of diabetes mellitus: Secondary | ICD-10-CM

## 2019-08-26 DIAGNOSIS — Z87891 Personal history of nicotine dependence: Secondary | ICD-10-CM

## 2019-08-26 DIAGNOSIS — I5042 Chronic combined systolic (congestive) and diastolic (congestive) heart failure: Secondary | ICD-10-CM

## 2019-08-26 DIAGNOSIS — D509 Iron deficiency anemia, unspecified: Secondary | ICD-10-CM | POA: Diagnosis present

## 2019-08-26 DIAGNOSIS — N179 Acute kidney failure, unspecified: Secondary | ICD-10-CM | POA: Diagnosis present

## 2019-08-26 DIAGNOSIS — Z883 Allergy status to other anti-infective agents status: Secondary | ICD-10-CM

## 2019-08-26 DIAGNOSIS — Z79899 Other long term (current) drug therapy: Secondary | ICD-10-CM

## 2019-08-26 DIAGNOSIS — R002 Palpitations: Secondary | ICD-10-CM

## 2019-08-26 DIAGNOSIS — G4700 Insomnia, unspecified: Secondary | ICD-10-CM | POA: Diagnosis present

## 2019-08-26 DIAGNOSIS — J8410 Pulmonary fibrosis, unspecified: Secondary | ICD-10-CM | POA: Diagnosis present

## 2019-08-26 DIAGNOSIS — Z881 Allergy status to other antibiotic agents status: Secondary | ICD-10-CM

## 2019-08-26 DIAGNOSIS — N400 Enlarged prostate without lower urinary tract symptoms: Secondary | ICD-10-CM | POA: Diagnosis present

## 2019-08-26 DIAGNOSIS — E875 Hyperkalemia: Principal | ICD-10-CM | POA: Diagnosis present

## 2019-08-26 DIAGNOSIS — G4730 Sleep apnea, unspecified: Secondary | ICD-10-CM | POA: Diagnosis present

## 2019-08-26 DIAGNOSIS — R42 Dizziness and giddiness: Secondary | ICD-10-CM

## 2019-08-26 DIAGNOSIS — Z825 Family history of asthma and other chronic lower respiratory diseases: Secondary | ICD-10-CM

## 2019-08-26 DIAGNOSIS — F319 Bipolar disorder, unspecified: Secondary | ICD-10-CM | POA: Diagnosis present

## 2019-08-26 DIAGNOSIS — R81 Glycosuria: Secondary | ICD-10-CM | POA: Diagnosis present

## 2019-08-26 DIAGNOSIS — K5090 Crohn's disease, unspecified, without complications: Secondary | ICD-10-CM | POA: Diagnosis present

## 2019-08-26 LAB — CBG MONITORING, ED
Glucose-Capillary: 183 mg/dL — ABNORMAL HIGH (ref 70–99)
Glucose-Capillary: 41 mg/dL — CL (ref 70–99)
Glucose-Capillary: 44 mg/dL — CL (ref 70–99)

## 2019-08-26 LAB — CBC
HCT: 36.5 % — ABNORMAL LOW (ref 39.0–52.0)
Hemoglobin: 11.2 g/dL — ABNORMAL LOW (ref 13.0–17.0)
MCH: 33.1 pg (ref 26.0–34.0)
MCHC: 30.7 g/dL (ref 30.0–36.0)
MCV: 108 fL — ABNORMAL HIGH (ref 80.0–100.0)
Platelets: 183 10*3/uL (ref 150–400)
RBC: 3.38 MIL/uL — ABNORMAL LOW (ref 4.22–5.81)
RDW: 13.7 % (ref 11.5–15.5)
WBC: 9.3 10*3/uL (ref 4.0–10.5)
nRBC: 0 % (ref 0.0–0.2)

## 2019-08-26 LAB — BASIC METABOLIC PANEL
Anion gap: 8 (ref 5–15)
Anion gap: 9 (ref 5–15)
Anion gap: 7 (ref 5–15)
Anion gap: 9 (ref 5–15)
BUN: 31 mg/dL — ABNORMAL HIGH (ref 8–23)
BUN: 36 mg/dL — ABNORMAL HIGH (ref 8–23)
BUN: 35 mg/dL — ABNORMAL HIGH (ref 8–23)
BUN: 36 mg/dL — ABNORMAL HIGH (ref 8–23)
CO2: 24 mmol/L (ref 22–32)
CO2: 22 mmol/L (ref 22–32)
CO2: 23 mmol/L (ref 22–32)
CO2: 24 mmol/L (ref 22–32)
Calcium: 9 mg/dL (ref 8.9–10.3)
Calcium: 8.7 mg/dL — ABNORMAL LOW (ref 8.9–10.3)
Calcium: 8.8 mg/dL — ABNORMAL LOW (ref 8.9–10.3)
Calcium: 8.5 mg/dL — ABNORMAL LOW (ref 8.9–10.3)
Chloride: 106 mmol/L (ref 98–111)
Chloride: 107 mmol/L (ref 98–111)
Chloride: 107 mmol/L (ref 98–111)
Chloride: 108 mmol/L (ref 98–111)
Creatinine, Ser: 2.49 mg/dL — ABNORMAL HIGH (ref 0.61–1.24)
Creatinine, Ser: 2.68 mg/dL — ABNORMAL HIGH (ref 0.61–1.24)
Creatinine, Ser: 2.68 mg/dL — ABNORMAL HIGH (ref 0.61–1.24)
Creatinine, Ser: 2.61 mg/dL — ABNORMAL HIGH (ref 0.61–1.24)
GFR calc Af Amer: 28 mL/min — ABNORMAL LOW (ref >60)
GFR calc Af Amer: 26 mL/min — ABNORMAL LOW (ref >60)
GFR calc Af Amer: 26 mL/min — ABNORMAL LOW (ref >60)
GFR calc Af Amer: 27 mL/min — ABNORMAL LOW (ref >60)
GFR calc non Af Amer: 24 mL/min — ABNORMAL LOW (ref >60)
GFR calc non Af Amer: 22 mL/min — ABNORMAL LOW (ref >60)
GFR calc non Af Amer: 22 mL/min — ABNORMAL LOW (ref >60)
GFR calc non Af Amer: 23 mL/min — ABNORMAL LOW (ref >60)
Glucose, Bld: 92 mg/dL (ref 70–99)
Glucose, Bld: 114 mg/dL — ABNORMAL HIGH (ref 70–99)
Glucose, Bld: 88 mg/dL (ref 70–99)
Glucose, Bld: 57 mg/dL — ABNORMAL LOW (ref 70–99)
Potassium: 6.7 mmol/L (ref 3.5–5.1)
Potassium: 6.6 mmol/L (ref 3.5–5.1)
Potassium: 6.9 mmol/L (ref 3.5–5.1)
Potassium: 5.7 mmol/L — ABNORMAL HIGH (ref 3.5–5.1)
Sodium: 138 mmol/L (ref 135–145)
Sodium: 138 mmol/L (ref 135–145)
Sodium: 137 mmol/L (ref 135–145)
Sodium: 141 mmol/L (ref 135–145)

## 2019-08-26 LAB — URINALYSIS, ROUTINE W REFLEX MICROSCOPIC
Bacteria, UA: NONE SEEN
Bilirubin Urine: NEGATIVE
Glucose, UA: >=500 mg/dL — AB
Hgb urine dipstick: NEGATIVE
Ketones, ur: NEGATIVE mg/dL
Leukocytes,Ua: NEGATIVE
Nitrite: NEGATIVE
Protein, ur: NEGATIVE mg/dL
Specific Gravity, Urine: 1.012 (ref 1.005–1.030)
pH: 7 (ref 5.0–8.0)

## 2019-08-26 LAB — GLUCOSE, CAPILLARY: Glucose-Capillary: 171 mg/dL — ABNORMAL HIGH (ref 70–99)

## 2019-08-26 LAB — NA AND K (SODIUM & POTASSIUM), RAND UR
Potassium Urine: 27 mmol/L
Sodium, Ur: 124 mmol/L

## 2019-08-26 LAB — CREATININE, URINE, RANDOM: Creatinine, Urine: 102.3 mg/dL

## 2019-08-26 LAB — TROPONIN I (HIGH SENSITIVITY)
Troponin I (High Sensitivity): 9 ng/L (ref <18)
Troponin I (High Sensitivity): 9 ng/L (ref <18)

## 2019-08-26 LAB — SARS CORONAVIRUS 2 BY RT PCR (HOSPITAL ORDER, PERFORMED IN ~~LOC~~ HOSPITAL LAB): SARS Coronavirus 2: NEGATIVE

## 2019-08-26 MED ORDER — TAMSULOSIN HCL 0.4 MG PO CAPS
0.4000 mg | ORAL_CAPSULE | Freq: Every day | ORAL | Status: DC
  Administered 2019-08-27 – 2019-08-28 (×2): 0.4 mg via ORAL
  Filled 2019-08-26 (×2): qty 1

## 2019-08-26 MED ORDER — INSULIN ASPART 100 UNIT/ML IV SOLN
5.0000 [IU] | Freq: Once | INTRAVENOUS | Status: AC
  Administered 2019-08-26: 5 [IU] via INTRAVENOUS

## 2019-08-26 MED ORDER — VITAMIN B-12 1000 MCG PO TABS
1000.0000 ug | ORAL_TABLET | Freq: Every day | ORAL | Status: DC
  Administered 2019-08-27 – 2019-08-28 (×2): 1000 ug via ORAL
  Filled 2019-08-26 (×2): qty 1

## 2019-08-26 MED ORDER — ISOSORBIDE MONONITRATE ER 30 MG PO TB24
30.0000 mg | ORAL_TABLET | Freq: Every day | ORAL | Status: DC
  Administered 2019-08-27 – 2019-08-28 (×2): 30 mg via ORAL
  Filled 2019-08-26 (×2): qty 1

## 2019-08-26 MED ORDER — METOPROLOL SUCCINATE ER 25 MG PO TB24
25.0000 mg | ORAL_TABLET | Freq: Every day | ORAL | Status: DC
  Administered 2019-08-26 – 2019-08-27 (×2): 25 mg via ORAL
  Filled 2019-08-26 (×2): qty 1

## 2019-08-26 MED ORDER — SODIUM CHLORIDE 0.9 % IV BOLUS
500.0000 mL | Freq: Once | INTRAVENOUS | Status: AC
  Administered 2019-08-26: 500 mL via INTRAVENOUS

## 2019-08-26 MED ORDER — SODIUM ZIRCONIUM CYCLOSILICATE 10 G PO PACK
10.0000 g | PACK | Freq: Every day | ORAL | Status: DC
  Administered 2019-08-26: 10 g via ORAL
  Filled 2019-08-26: qty 1

## 2019-08-26 MED ORDER — IVABRADINE HCL 5 MG PO TABS
5.0000 mg | ORAL_TABLET | Freq: Two times a day (BID) | ORAL | Status: DC
  Administered 2019-08-27 – 2019-08-28 (×3): 5 mg via ORAL
  Filled 2019-08-26 (×5): qty 1

## 2019-08-26 MED ORDER — INSULIN ASPART 100 UNIT/ML ~~LOC~~ SOLN: 0.0000 [IU] | Freq: Every day | SUBCUTANEOUS | Status: DC

## 2019-08-26 MED ORDER — DEXTROSE 50 % IV SOLN
1.0000 | Freq: Once | INTRAVENOUS | Status: AC
  Administered 2019-08-26: 50 mL via INTRAVENOUS
  Filled 2019-08-26: qty 50

## 2019-08-26 MED ORDER — SODIUM CHLORIDE 0.9% FLUSH
3.0000 mL | Freq: Once | INTRAVENOUS | Status: AC
  Administered 2019-08-26: 3 mL via INTRAVENOUS

## 2019-08-26 MED ORDER — SODIUM ZIRCONIUM CYCLOSILICATE 10 G PO PACK: 10.0000 g | PACK | Freq: Once | ORAL | Status: DC

## 2019-08-26 MED ORDER — ENOXAPARIN SODIUM 30 MG/0.3ML ~~LOC~~ SOLN
30.0000 mg | SUBCUTANEOUS | Status: DC
  Administered 2019-08-26 – 2019-08-27 (×2): 30 mg via SUBCUTANEOUS
  Filled 2019-08-26 (×2): qty 0.3

## 2019-08-26 MED ORDER — SODIUM BICARBONATE 8.4 % IV SOLN
50.0000 meq | Freq: Once | INTRAVENOUS | Status: AC
  Administered 2019-08-26: 50 meq via INTRAVENOUS
  Filled 2019-08-26: qty 50

## 2019-08-26 MED ORDER — INSULIN ASPART 100 UNIT/ML ~~LOC~~ SOLN: 0.0000 [IU] | Freq: Three times a day (TID) | SUBCUTANEOUS | Status: DC

## 2019-08-26 MED ORDER — PANTOPRAZOLE SODIUM 40 MG PO TBEC
40.0000 mg | DELAYED_RELEASE_TABLET | Freq: Every day | ORAL | Status: DC
  Administered 2019-08-27 – 2019-08-28 (×2): 40 mg via ORAL
  Filled 2019-08-26 (×2): qty 1

## 2019-08-26 MED ORDER — SODIUM ZIRCONIUM CYCLOSILICATE 10 G PO PACK
10.0000 g | PACK | Freq: Once | ORAL | Status: AC
  Administered 2019-08-27: 10 g via ORAL
  Filled 2019-08-26: qty 1

## 2019-08-26 MED ORDER — ALBUTEROL SULFATE (2.5 MG/3ML) 0.083% IN NEBU
10.0000 mg | INHALATION_SOLUTION | Freq: Once | RESPIRATORY_TRACT | Status: AC
  Administered 2019-08-26: 10 mg via RESPIRATORY_TRACT
  Filled 2019-08-26: qty 12

## 2019-08-26 MED ORDER — ASPIRIN EC 81 MG PO TBEC
81.0000 mg | DELAYED_RELEASE_TABLET | Freq: Every day | ORAL | Status: DC
  Administered 2019-08-27 – 2019-08-28 (×2): 81 mg via ORAL
  Filled 2019-08-26 (×2): qty 1

## 2019-08-26 NOTE — ED Provider Notes (Signed)
[facility] EMERGENCY DEPARTMENT Provider Note   CSN: [id number] Arrival date & time: 08/26/19  1652     History Chief Complaint  Patient presents with  . Abnormal Lab    Brent [name] is a 74 y.o. male history of celiac disease, stage III kidney disease, presenting emergency department hyperkalemia.  The patient reports he was contacted by Dr. [name] his cardiologist today because he had outpatient lab testing notable for an elevated potassium.  Patient reports that he has had some lightheadedness and palpitations over the past several days.  He says he has never had a problem with high potassium but in the past has had low potassium.  He denies any changes in his urinary habits.  He is not on dialysis.  He denies taking potassium supplements at home.  He denies any vomiting or diarrhea but does have often loose stools with his GI disease.  No active lightheadedness, chest pain, SOB, or palpitations in the ED.  Nephrologist is Dr Posey Pronto  HPI     Past Medical History:  Diagnosis Date  . Anemia   . Anxiety   . Benign paroxysmal positional vertigo   . Bipolar I disorder, most recent episode (or current) unspecified   . Celiac disease   . CHF (congestive heart failure) ([place])   . Chronic kidney disease, stage III (moderate)   . Crohn's    Remicade q8 weeks  . Depression   . Essential and other specified forms of tremor    medication-induced Parkinson's, now resolved  . Essential hypertension, benign   . Gout 2018  . Heart failure ([place])   . Hypertrophy of prostate without urinary obstruction and other lower urinary tract symptoms (LUTS)   . Impotence of organic origin   . Insomnia with sleep apnea, unspecified   . Iron deficiency anemia, unspecified   . 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 Neuralgia, neuritis, and radiculitis, unspecified   . Other B-complex deficiencies   . Other extrapyramidal disease and abnormal movement disorder   . Postinflammatory pulmonary  fibrosis ([place])   . Tobacco use disorder   . Vertigo 2018    Patient Active Problem List   Diagnosis Date Noted  . Hyperkalemia 08/26/2019  . Chronic systolic CHF (congestive heart failure) ([place]) 08/26/2019  . Type 2 diabetes mellitus with stage 3 chronic kidney disease ([place]) 08/26/2019  . Chronic obstructive pulmonary disease ([place]) 06/20/2019  . Ventricular tachycardia ([place]) 06/20/2019  . Acute on chronic systolic (congestive) heart failure ([place]) 04/25/2019  . Metabolic acidosis 35/45/6256  . Orthostasis 09/07/2018  . AKI (acute kidney injury) ([place])   . Immunosuppressed status ([place])   . Macrocytic anemia   . Chronic combined systolic and diastolic congestive heart failure ([place]) 06/03/2018  . Candida esophagitis ([place]) 09/22/2017  . History of smoking 30 or more pack years 01/05/2017  . Bipolar 1 disorder ([place])   . BPH (benign prostatic hyperplasia) 03/31/2013  . 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 Essential hypertension, benign 03/31/2013  . Acute renal failure superimposed on stage 3 chronic kidney disease ([place]) 06/04/2012  . 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[place]) 01/23/2010    Past Surgical History:  Procedure Laterality Date  . CATARACT EXTRACTION, BILATERAL Bilateral 09/2018  . CHOLECYSTECTOMY  07-12-2010  . RIGHT HEART CATH N/A 10/12/2018   Procedure: RIGHT HEART CATH;  Surgeon: Jolaine Artist, MD;  Location: [facility];  Service: Cardiovascular;  Laterality: N/A;  . RIGHT/LEFT HEART CATH AND CORONARY ANGIOGRAPHY N/A 04/26/2019  Procedure: RIGHT/LEFT HEART CATH AND CORONARY ANGIOGRAPHY;  Surgeon: Larey Dresser, MD;  Location: [facility] CV LAB;  Service: Cardiovascular;  Laterality: N/A;  . SMALL INTESTINE SURGERY     x 2       Family History  Problem Relation Age of Onset  . Diabetes Mother        maternal grandmother  . Uterine cancer Mother   . Emphysema Father   . Pneumonia Maternal Grandmother   . Colon cancer Neg Hx     Social History    Tobacco Use  . Smoking status: Former Smoker    Packs/day: 1.00    Years: 49.00    Pack years: 49.00    Types: Cigarettes    Start date: 04/11/1956    Quit date: 04/17/2014    Years since quitting: 5.3  . Smokeless tobacco: Never Used  Vaping Use  . Vaping Use: Never used  Substance Use Topics  . Alcohol use: No    Alcohol/week: 0.0 standard drinks  . Drug use: No    Home Medications Prior to Admission medications   Medication Sig Start Date End Date Taking? Authorizing Provider  allopurinol (ZYLOPRIM) 100 MG tablet Take 2 tablets (200 mg total) by mouth daily. Patient taking differently: Take 100 mg by mouth daily.  01/05/19  Yes Reed, Tiffany L, DO  aspirin EC 81 MG tablet Take 1 tablet (81 mg total) by mouth daily. 04/19/19  Yes Larey Dresser, MD  Cholecalciferol (VITAMIN D) 125 MCG (5000 UT) CAPS Take 5,000 Units by mouth daily.    Yes [provider]  dapagliflozin propanediol (FARXIGA) 10 MG TABS tablet Take 1 tablet (10 mg total) by mouth daily before breakfast. 08/16/19  Yes Larey Dresser, MD  divalproex (DEPAKOTE ER) 500 MG 24 hr tablet Take 1,500 mg by mouth at bedtime.   Yes [provider]  epoetin alfa (PROCRIT) 53976 UNIT/ML injection Inject 10,000 Units into the skin every 30 (thirty) days.    Yes [provider]  ferrous sulfate 325 (65 FE) MG tablet Take 325 mg by mouth every Monday, Wednesday, and Friday.    Yes [provider]  hydrALAZINE (APRESOLINE) 25 MG tablet Take 1 tablet (25 mg total) by mouth 2 (two) times daily. 01/17/19  Yes Simmons, Brittainy M, PA-C  inFLIXimab (REMICADE) 100 MG injection Infuse Remicade IV schedule 1 9m/kg every 8 weeks Premedicate with Tylenol 500-6520mby mouth and Benadryl 25-5014my mouth prior to infusion. Last PPD was on 12/2009.  11/22/10  Yes JacMilus BanisterD  isosorbide mononitrate (IMDUR) 30 MG 24 hr tablet Take 1 tablet (30 mg total) by mouth daily. 12/28/18  Yes Bensimhon,  DanShaune PascalD  ivabradine (CORLANOR) 5 MG TABS tablet Take 1 tablet (5 mg total) by mouth 2 (two) times daily with a meal. 06/29/19  Yes Bensimhon, DanShaune PascalD  loperamide (IMODIUM) 2 MG capsule Take 1 capsule (2 mg total) by mouth 4 (four) times daily as needed for diarrhea or loose stools. 01/18/19  Yes Fawze, Mina A, PA-C  magnesium oxide (MAG-OX) 400 MG tablet Take 800 mg by mouth in the morning, at noon, and at bedtime.    Yes [provider]  metoprolol succinate (TOPROL-XL) 25 MG 24 hr tablet Take 1 tablet (25 mg total) by mouth at bedtime. 05/09/19  Yes Simmons, Brittainy M, PA-C  OLANZapine (ZYPREXA) 7.5 MG tablet Take 1 tablet (7.5 mg total) by mouth at bedtime. 01/03/16  Yes ReeMariea ClontsifJonelle Sidle  L, DO  omeprazole (PRILOSEC) 20 MG capsule Take 20 mg by mouth daily before breakfast.    Yes [provider]  tamsulosin (FLOMAX) 0.4 MG CAPS capsule Take 1 capsule (0.4 mg total) by mouth daily. 06/16/19  Yes Reed, Tiffany L, DO  triamcinolone (NASACORT ALLERGY 24HR) 55 MCG/ACT AERO nasal inhaler Place 2 sprays into the nose daily as needed (allergies).   Yes [provider]  vitamin B-12 (CYANOCOBALAMIN) 1000 MCG tablet Take 1,000 mcg by mouth daily.   Yes [provider]    Allergies    Azathioprine, Ciprofloxacin, Levaquin [levofloxacin in d5w], and Plendil [felodipine]  Review of Systems   Review of Systems  Constitutional: Negative for chills and fever.  HENT: Negative for ear pain and sore throat.   Eyes: Negative for photophobia and visual disturbance.  Respiratory: Negative for cough and shortness of breath.   Cardiovascular: Positive for palpitations. Negative for chest pain.  Gastrointestinal: Negative for abdominal pain, nausea and vomiting.  Genitourinary: Negative for dysuria and hematuria.  Musculoskeletal: Negative for arthralgias and back pain.  Skin: Negative for color change and rash.  Neurological: Positive for light-headedness. Negative for  syncope.  All other systems reviewed and are negative.   Physical Exam Updated Vital Signs BP 122/76 (BP Location: Right Arm)   Pulse 90   Temp 97.7 F (36.5 C) (Oral)   Resp 16   Ht 6' 4"  (1.93 m)   Wt 82.7 kg   SpO2 94%   BMI 22.19 kg/m   Physical Exam Vitals and nursing note reviewed.  Constitutional:      Appearance: He is well-developed.  HENT:     Head: Normocephalic and atraumatic.  Eyes:     Conjunctiva/sclera: Conjunctivae normal.  Cardiovascular:     Rate and Rhythm: Normal rate and regular rhythm.     Pulses: Normal pulses.  Pulmonary:     Effort: Pulmonary effort is normal. No respiratory distress.     Breath sounds: Normal breath sounds.  Abdominal:     Palpations: Abdomen is soft.     Tenderness: There is no abdominal tenderness.  Musculoskeletal:     Cervical back: Neck supple.  Skin:    General: Skin is warm and dry.  Neurological:     General: No focal deficit present.     Mental Status: He is alert and oriented to person, place, and time.     ED Results / Procedures / Treatments   Labs (all labs ordered are listed, but only abnormal results are displayed) Labs Reviewed  BASIC METABOLIC PANEL - Abnormal; Notable for the following components:      Result Value   Potassium 6.6 (*)    Glucose, Bld 114 (*)    BUN 36 (*)    Creatinine, Ser 2.68 (*)    Calcium 8.7 (*)    GFR calc non Af Amer 22 (*)    GFR calc Af Amer 26 (*)    All other components within normal limits  CBC - Abnormal; Notable for the following components:   RBC 3.38 (*)    Hemoglobin 11.2 (*)    HCT 36.5 (*)    MCV 108.0 (*)    All other components within normal limits  BASIC METABOLIC PANEL - Abnormal; Notable for the following components:   Potassium 6.9 (*)    BUN 35 (*)    Creatinine, Ser 2.68 (*)    Calcium 8.8 (*)    GFR calc non Af Amer 22 (*)  GFR calc Af Amer 26 (*)    All other components within normal limits  URINALYSIS, ROUTINE W REFLEX MICROSCOPIC -  Abnormal; Notable for the following components:   Glucose, UA >=500 (*)    All other components within normal limits  BASIC METABOLIC PANEL - Abnormal; Notable for the following components:   Potassium 5.7 (*)    Glucose, Bld 57 (*)    BUN 36 (*)    Creatinine, Ser 2.61 (*)    Calcium 8.5 (*)    GFR calc non Af Amer 23 (*)    GFR calc Af Amer 27 (*)    All other components within normal limits  GLUCOSE, CAPILLARY - Abnormal; Notable for the following components:   Glucose-Capillary 171 (*)    All other components within normal limits  CBG MONITORING, ED - Abnormal; Notable for the following components:   Glucose-Capillary 41 (*)    All other components within normal limits  CBG MONITORING, ED - Abnormal; Notable for the following components:   Glucose-Capillary 44 (*)    All other components within normal limits  CBG MONITORING, ED - Abnormal; Notable for the following components:   Glucose-Capillary 183 (*)    All other components within normal limits  SARS CORONAVIRUS 2 BY RT PCR (HOSPITAL ORDER, [facility])  NA AND K (SODIUM & POTASSIUM), RAND UR  CREATININE, URINE, RANDOM  COMPREHENSIVE METABOLIC PANEL  CBC  PROTIME-INR  APTT  MAGNESIUM  PHOSPHORUS  BASIC METABOLIC PANEL  HEMOGLOBIN A1C  TROPONIN I (HIGH SENSITIVITY)  TROPONIN I (HIGH SENSITIVITY)    EKG None  Radiology No results found.  Procedures .Critical Care Performed by: Wyvonnia Dusky, MD Authorized by: Wyvonnia Dusky, MD   Critical care provider statement:    Critical care time (minutes):  45   Critical care was necessary to treat or prevent imminent or life-threatening deterioration of the following conditions:  Metabolic crisis   Critical care was time spent personally by me on the following activities:  Discussions with consultants, evaluation of patient's response to treatment, examination of patient, ordering and performing treatments and interventions, ordering  and review of laboratory studies, ordering and review of radiographic studies, pulse oximetry, re-evaluation of patient's condition, obtaining history from patient or surrogate and review of old charts Comments:     Hyperkalemia symptomatic requiring IV and PO medications   (including critical care time)  Medications Ordered in ED Medications  enoxaparin (LOVENOX) injection 30 mg (30 mg Subcutaneous Given 08/26/19 2222)  aspirin EC tablet 81 mg (81 mg Oral Not Given 08/26/19 2229)  isosorbide mononitrate (IMDUR) 24 hr tablet 30 mg (30 mg Oral Not Given 08/26/19 2229)  ivabradine (CORLANOR) tablet 5 mg (has no administration in time range)  metoprolol succinate (TOPROL-XL) 24 hr tablet 25 mg (25 mg Oral Given 08/26/19 2221)  pantoprazole (PROTONIX) EC tablet 40 mg (40 mg Oral Not Given 08/26/19 2230)  tamsulosin (FLOMAX) capsule 0.4 mg (0.4 mg Oral Not Given 08/26/19 2230)  vitamin B-12 (CYANOCOBALAMIN) tablet 1,000 mcg (1,000 mcg Oral Not Given 08/26/19 2230)  insulin aspart (novoLOG) injection 0-9 Units (has no administration in time range)  insulin aspart (novoLOG) injection 0-5 Units (0 Units Subcutaneous Not Given 08/26/19 2313)  sodium chloride flush (NS) 0.9 % injection 3 mL (3 mLs Intravenous Given 08/26/19 2223)  insulin aspart (novoLOG) injection 5 Units (5 Units Intravenous Given 08/26/19 1942)    And  dextrose 50 % solution 50 mL (50 mLs Intravenous  Given 08/26/19 1945)  albuterol (PROVENTIL) (2.5 MG/3ML) 0.083% nebulizer solution 10 mg (10 mg Nebulization Given 08/26/19 1943)  sodium chloride 0.9 % bolus 500 mL (0 mLs Intravenous Stopped 08/26/19 2049)  sodium bicarbonate injection 50 mEq (50 mEq Intravenous Given 08/26/19 1944)  dextrose 50 % solution 50 mL (50 mLs Intravenous Given 08/26/19 2108)  dextrose 50 % solution 50 mL (50 mLs Intravenous Given 08/26/19 2224)  sodium bicarbonate injection 50 mEq (50 mEq Intravenous Given 08/26/19 2227)  sodium zirconium cyclosilicate (LOKELMA) packet 10 g (10 g Oral  Given 08/27/19 0031)    ED Course  I have reviewed the triage vital signs and the nursing notes.  Pertinent labs & imaging results that were available during my care of the patient were reviewed by me and considered in my medical decision making (see chart for details).  74 yo male w/ worsening CKD presenting with hyperkalemia, palpitations and lightheadedness at home.  Suspect this hyperK is related to his worsening kidney disease. I don't think he needs emergent dialysis at the moment.  He is making urine and we can medically treat his potassium levels for now.  No ectopy on ECG, no sign of acute hyperK or widened QRS on ECG per my interpretation. However his symptoms are concerning with lightheadedness at home.  Plan to treat for emergent hyper K with IV insulin, albuterol, lokelma.  Consulted with nephrology as noted below, and plan for admission.  I did not order IV calcium gluconate as his ECG was stable on arrival and did not show impending arrhythmia or arrest, but if his symptoms or telemetry changes, we will give this as well.  Recheck BMP after medication  Clinical Course as of Aug 26 132  Fri Aug 26, 2019  1940 I spoke to Dr Royce Macadamia from nephrology who agreed with medication plan and tele admission, and asked for 1 amp sodium bicarb be added on.  She'll trend labs   [MT]  2015 Signed out to hospitalist  Sodium: 137 [MT]    Clinical Course User Index [MT] Deaundra Kutzer, Carola Rhine, MD    Final Clinical Impression(s) / ED Diagnoses Final diagnoses:  Hyperkalemia  Chronic kidney disease, unspecified CKD stage  Palpitations  Lightheaded    Rx / DC Orders ED Discharge Orders    None       Darcey Demma, Carola Rhine, MD 08/27/19 [phone]

## 2019-08-26 NOTE — Telephone Encounter (Signed)
Patient advised and verbalized understanding.pt will come in today for repeat blood work. Lab order entered.   Orders Placed This Encounter  Procedures  . Basic Metabolic Panel (BMET)    Standing Status:   Future    Standing Expiration Date:   08/25/2020    Order Specific Question:   Release to patient    Answer:   Immediate

## 2019-08-26 NOTE — Telephone Encounter (Signed)
-----   Message from Larey Dresser, MD sent at 08/25/2019  7:11 PM EDT ----- Possibly hemolyzed.  He is not on a K supplement.  Repeat BMET stat.  If K is elevated on stat repeat, he should get [place] 8.4 g x 1 and then reassess with BMET.

## 2019-08-26 NOTE — Telephone Encounter (Signed)
[facility] lab called with critical lab results. K-6.7. per Dr.McLean "Possibly hemolyzed.  He is not on a K supplement.  Repeat BMET stat.  If K is elevated on stat repeat, he should get Veltassa 8.4 g x 1 and then reassess with BMET." Dr.McLean is on vacation so I reported the critical value to [employer]. She agreed patient should take Veltassa 8.4 g x once today and have labs drawn tomorrow. Our office and [facility] is closed tomorrow so pt will need to have labs drawn at the Emergency room. Called patient no answer/left VM for pt to return call to triage line. I will also try patient again.

## 2019-08-26 NOTE — ED Notes (Signed)
Pt nurse A.C. was notified of 41cbg.

## 2019-08-26 NOTE — ED Notes (Addendum)
Pt provided with 8oz of orange juice and instructed to drink it all.

## 2019-08-26 NOTE — H&P (Signed)
History and Physical  Brent [name] HYQ:[id number] DOB: [birth year] DOA: 08/26/2019  Referring physician: Octaviano Glow, MD PCP: Gayland Curry, DO  Patient coming from: Home  Chief Complaint: Abnormal Lab  HPI: Brent [name] is a 74 y.o. male with medical history significant for Crohn's disease, CKD stage III (follows with nephrologist-Dr. Posey Pronto), HFrEF (LVEF 20 to 25%-09/29/2018) who presents to the emergency department due to abnormal labs.  Patient complained of lightheadedness and some palpitations over the past several days, he had an outpatient lab testing done at his cardiologist office today which showed an elevated potassium level.  Patient states that he was called by his cardiologist to go to the ED for further evaluation and management based on the abnormal level of potassium.  He denies fever, chills, nausea, vomiting, chest pain or abdominal pain.  ED Course:  In the emergency department, he was hemodynamically stable.  Work-up in the ED showed macrocytic anemia, hyperkalemia, baseline creatinine 36/2.68 (creatinine has been trending up since last 3 months from 2.25).  Troponin I x 2 =  9, urinalysis was positive for glycosuria. Patient was provided with albuterol nebulizer treatment, IV insulin and D50, sodium bicarbonate and IV hydration with 500 mL of NS.  Hospitalist was asked to admit patient for further evaluation management.  Review of Systems: Constitutional: Negative for chills and fever.  HENT: Negative for ear pain and sore throat.   Eyes: Negative for pain and visual disturbance.  Respiratory: Negative for cough, chest tightness and shortness of breath.   Cardiovascular: Positive for palpitations.  Negative for chest pain.  Gastrointestinal: Negative for abdominal pain and vomiting.  Endocrine: Negative for polyphagia and polyuria.  Genitourinary: Negative for decreased urine volume, dysuria, enuresis, hematuria, vaginal discharge and vaginal pain.  Musculoskeletal:  Negative for arthralgias and back pain.  Skin: Negative for color change and rash.  Allergic/Immunologic: Negative for immunocompromised state.  Neurological: Positive for lightheadedness.  Negative for tremors, syncope, speech difficulty, weakness and headaches.  Hematological: Does not bruise/bleed easily.  All other systems reviewed and are negative    Past Medical History:  Diagnosis Date  . Anemia   . Anxiety   . Benign paroxysmal positional vertigo   . Bipolar I disorder, most recent episode (or current) unspecified   . Celiac disease   . CHF (congestive heart failure) ([place])   . Chronic kidney disease, stage III (moderate)   . Crohn's    Remicade q8 weeks  . Depression   . Essential and other specified forms of tremor    medication-induced Parkinson's, now resolved  . Essential hypertension, benign   . Gout 2018  . Heart failure ([place])   . Hypertrophy of prostate without urinary obstruction and other lower urinary tract symptoms (LUTS)   . Impotence of organic origin   . Insomnia with sleep apnea, unspecified   . Iron deficiency anemia, unspecified   . 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 Neuralgia, neuritis, and radiculitis, unspecified   . Other B-complex deficiencies   . Other extrapyramidal disease and abnormal movement disorder   . Postinflammatory pulmonary fibrosis ([place])   . Tobacco use disorder   . Vertigo 2018   Past Surgical History:  Procedure Laterality Date  . CATARACT EXTRACTION, BILATERAL Bilateral 09/2018  . CHOLECYSTECTOMY  07-12-2010  . RIGHT HEART CATH N/A 10/12/2018   Procedure: RIGHT HEART CATH;  Surgeon: Jolaine Artist, MD;  Location: [facility];  Service: Cardiovascular;  Laterality: N/A;  . RIGHT/LEFT HEART CATH AND CORONARY ANGIOGRAPHY  N/A 04/26/2019   Procedure: RIGHT/LEFT HEART CATH AND CORONARY ANGIOGRAPHY;  Surgeon: Larey Dresser, MD;  Location: [facility];  Service: Cardiovascular;  Laterality: N/A;  . SMALL INTESTINE SURGERY      x 2    Social History:  reports that he quit smoking about 5 years ago. His smoking use included cigarettes. He started smoking about 63 years ago. He has a 49.00 pack-year smoking history. He has never used smokeless tobacco. He reports that he does not drink alcohol and does not use drugs.   Allergies  Allergen Reactions  . Azathioprine Other (See Comments)    REACTION: affected WBC "Almost died"  . Ciprofloxacin Other (See Comments)    Unknown rxn  . Levaquin [Levofloxacin In D5w] Other (See Comments)    Unknown rxn  . Plendil [Felodipine] Other (See Comments)    Unknown rxn    Family History  Problem Relation Age of Onset  . Diabetes Mother        maternal grandmother  . Uterine cancer Mother   . Emphysema Father   . Pneumonia Maternal Grandmother   . Colon cancer Neg Hx     Prior to Admission medications   Medication Sig Start Date End Date Taking? Authorizing Provider  allopurinol (ZYLOPRIM) 100 MG tablet Take 2 tablets (200 mg total) by mouth daily. 01/05/19   Reed, Tiffany L, DO  aspirin EC 81 MG tablet Take 1 tablet (81 mg total) by mouth daily. 04/19/19   Larey Dresser, MD  Cholecalciferol (VITAMIN D) 125 MCG (5000 UT) CAPS Take 5,000 Units by mouth daily.     [provider]  dapagliflozin propanediol (FARXIGA) 10 MG TABS tablet Take 1 tablet (10 mg total) by mouth daily before breakfast. 08/16/19   Larey Dresser, MD  divalproex (DEPAKOTE ER) 500 MG 24 hr tablet Take 1,500 mg by mouth at bedtime.    [provider]  epoetin alfa (PROCRIT) 01601 UNIT/ML injection Inject 10,000 Units into the skin every 30 (thirty) days.     [provider]  ferrous sulfate 325 (65 FE) MG tablet Take 325 mg by mouth every Monday, Wednesday, and Friday.     [provider]  hydrALAZINE (APRESOLINE) 25 MG tablet Take 1 tablet (25 mg total) by mouth 2 (two) times daily. 01/17/19   Lyda Jester M, PA-C  inFLIXimab (REMICADE) 100 MG injection  Infuse Remicade IV schedule 1 90m/kg every 8 weeks Premedicate with Tylenol 500-6561mby mouth and Benadryl 25-5082my mouth prior to infusion. Last PPD was on 12/2009.  11/22/10   JacMilus BanisterD  isosorbide mononitrate (IMDUR) 30 MG 24 hr tablet Take 1 tablet (30 mg total) by mouth daily. 12/28/18   Bensimhon, DanShaune PascalD  ivabradine (CORLANOR) 5 MG TABS tablet Take 1 tablet (5 mg total) by mouth 2 (two) times daily with a meal. 06/29/19   Bensimhon, DanShaune PascalD  loperamide (IMODIUM) 2 MG capsule Take 1 capsule (2 mg total) by mouth 4 (four) times daily as needed for diarrhea or loose stools. 01/18/19   Fawze, Mina A, PA-C  magnesium oxide (MAG-OX) 400 MG tablet Take 800 mg by mouth in the morning, at noon, and at bedtime.     [provider]  metoprolol succinate (TOPROL-XL) 25 MG 24 hr tablet Take 1 tablet (25 mg total) by mouth at bedtime. 05/09/19   SimLyda Jester PA-C  OLANZapine (ZYPREXA) 7.5 MG tablet Take 1 tablet (7.5 mg total) by  mouth at bedtime. 01/03/16   Reed, Tiffany L, DO  omeprazole (PRILOSEC) 20 MG capsule Take 20 mg by mouth daily before breakfast.     [provider]  tamsulosin (FLOMAX) 0.4 MG CAPS capsule Take 1 capsule (0.4 mg total) by mouth daily. 06/16/19   Reed, Tiffany L, DO  triamcinolone (NASACORT ALLERGY 24HR) 55 MCG/ACT AERO nasal inhaler Place 2 sprays into the nose daily as needed (allergies).    [provider]  vitamin B-12 (CYANOCOBALAMIN) 1000 MCG tablet Take 1,000 mcg by mouth daily.    [provider]    Physical Exam: BP (!) 138/92   Pulse 88   Temp 98.6 F (37 C) (Oral)   Resp 19   Ht 6' 4"  (1.93 m)   Wt 80.3 kg   SpO2 100%   BMI 21.55 kg/m   . General: 74 y.o. year-old male well developed well nourished in no acute distress.  Alert and oriented x3. Marland Kitchen HEENT: Normocephalic, atraumatic . Neck: Supple, trachea medial . Cardiovascular: Regular rate and rhythm with no rubs or gallops.  No thyromegaly or  JVD noted.  No lower extremity edema. 2/4 pulses in all 4 extremities. Marland Kitchen Respiratory: Clear to auscultation with no wheezes or rales. Good inspiratory effort. . Abdomen: Soft nontender nondistended with normal bowel sounds x4 quadrants. . Muskuloskeletal: No cyanosis, clubbing or edema noted bilaterally . Neuro: CN II-XII intact, strength, sensation, reflexes . Skin: No ulcerative lesions noted or rashes . Psychiatry: Judgement and insight appear normal. Mood is appropriate for condition and setting          Labs on Admission:  Basic Metabolic Panel: Recent Labs  Lab 08/25/19 1429 08/26/19 1237 08/26/19 1709 08/26/19 1943  NA 137 138 138 137  K 6.1* 6.7* 6.6* 6.9*  CL 105 106 107 107  CO2 26 24 22 23   GLUCOSE 89 92 114* 88  BUN 26* 31* 36* 35*  CREATININE 2.52* 2.49* 2.68* 2.68*  CALCIUM 8.7* 9.0 8.7* 8.8*   Liver Function Tests: No results for input(s): AST, ALT, ALKPHOS, BILITOT, PROT, ALBUMIN in the last 168 hours. No results for input(s): LIPASE, AMYLASE in the last 168 hours. No results for input(s): AMMONIA in the last 168 hours. CBC: Recent Labs  Lab 08/26/19 1709  WBC 9.3  HGB 11.2*  HCT 36.5*  MCV 108.0*  PLT 183   Cardiac Enzymes: No results for input(s): CKTOTAL, CKMB, CKMBINDEX, TROPONINI in the last 168 hours.  BNP (last 3 results) Recent Labs    08/31/18 0134 11/10/18 1504  BNP 1,257.9* 501.7*    ProBNP (last 3 results) No results for input(s): PROBNP in the last 8760 hours.  CBG: No results for input(s): GLUCAP in the last 168 hours.  Radiological Exams on Admission: No results found.  EKG: I independently viewed the EKG done and my findings are as followed: Initial EKG showed normal sinus rhythm at rate of 73 bpm with T wave inversion in V4-V6.   Subsequent EKG showed sinus rhythm at rate of 62 bpm with T wave inversion in V5-V6  Assessment/Plan Present on Admission: . Hyperkalemia . Crohn's regional enteritis [facility])  Principal  Problem:   Hyperkalemia Active Problems:   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[place])   Acute renal failure superimposed on stage 3 chronic kidney disease (HCC)   Chronic systolic CHF (congestive heart failure) (HCC)   Hyperkalemia Patient will be admitted to telemetry unit K+ 6.7 >6.6> 6.9> 5.7 Breathing treatment with albuterol was given, IV insulin and D50  and Lokelma were given Patient was hemodynamically stable and without symptoms at this time EKG shows no acute changes Continue Lokelma and serial BMPs to monitor K+ level  Hypoglycemia possibly due to IV insulin IV D50 given; continue to monitor blood glucose level  Macrocytic anemia Continue vitamin B12 when med rec is updated  Type 2 diabetes mellitus Continue insulin sliding scale and hypoglycemia protocol [name] will be temporarily held at this time  Acute renal failure superimposed on stage 3 chronic kidney disease Creatinine on admission=2.68  ,(creatinine has been trending up since last 3 months from 2.25).   eGFR is currently 27 (Stage IV).  IV hydration provided in the ED Renally adjust medications, avoid nephrotoxic agents/dehydration/hypotension  Chronic systolic CHF Continue home evertor pain, metoprolol and isosorbide mononitrate when med rec is updated  BPH Continue Flomax Rheumatrex updated  DVT prophylaxis: Lovenox  Code Status: Full code  Family Communication: None at bedside  Disposition Plan:  Patient is from:                        home Anticipated DC to:                   home Anticipated DC date:               24 hours Anticipated DC barriers:          Correction of K+   Consults called: Nephrologist per ED Physician  Admission status: Observation  Height: 6' 4"  Weight: 82.7 Kg BMI: 22.19 kg/m   Bernadette Hoit MD [facility]  If 7PM-7AM, please contact night-coverage [email or web address]  08/26/2019, 8:58 PM

## 2019-08-26 NOTE — ED Triage Notes (Signed)
Pt sent by PCP for hyperkalemia. Pt denies chest pain, palpitations, sob.

## 2019-08-27 DIAGNOSIS — Z881 Allergy status to other antibiotic agents status: Secondary | ICD-10-CM | POA: Diagnosis not present

## 2019-08-27 DIAGNOSIS — J449 Chronic obstructive pulmonary disease, unspecified: Secondary | ICD-10-CM | POA: Diagnosis present

## 2019-08-27 DIAGNOSIS — E1122 Type 2 diabetes mellitus with diabetic chronic kidney disease: Secondary | ICD-10-CM | POA: Diagnosis present

## 2019-08-27 DIAGNOSIS — M109 Gout, unspecified: Secondary | ICD-10-CM | POA: Diagnosis present

## 2019-08-27 DIAGNOSIS — Z833 Family history of diabetes mellitus: Secondary | ICD-10-CM | POA: Diagnosis not present

## 2019-08-27 DIAGNOSIS — N1832 Chronic kidney disease, stage 3b: Secondary | ICD-10-CM | POA: Diagnosis not present

## 2019-08-27 DIAGNOSIS — I130 Hypertensive heart and chronic kidney disease with heart failure and stage 1 through stage 4 chronic kidney disease, or unspecified chronic kidney disease: Secondary | ICD-10-CM | POA: Diagnosis present

## 2019-08-27 DIAGNOSIS — Z7982 Long term (current) use of aspirin: Secondary | ICD-10-CM | POA: Diagnosis not present

## 2019-08-27 DIAGNOSIS — K5090 Crohn's disease, unspecified, without complications: Secondary | ICD-10-CM | POA: Diagnosis present

## 2019-08-27 DIAGNOSIS — Z8049 Family history of malignant neoplasm of other genital organs: Secondary | ICD-10-CM | POA: Diagnosis not present

## 2019-08-27 DIAGNOSIS — Z883 Allergy status to other anti-infective agents status: Secondary | ICD-10-CM | POA: Diagnosis not present

## 2019-08-27 DIAGNOSIS — Z87891 Personal history of nicotine dependence: Secondary | ICD-10-CM | POA: Diagnosis not present

## 2019-08-27 DIAGNOSIS — N179 Acute kidney failure, unspecified: Secondary | ICD-10-CM | POA: Diagnosis present

## 2019-08-27 DIAGNOSIS — Z825 Family history of asthma and other chronic lower respiratory diseases: Secondary | ICD-10-CM | POA: Diagnosis not present

## 2019-08-27 DIAGNOSIS — I5022 Chronic systolic (congestive) heart failure: Secondary | ICD-10-CM | POA: Diagnosis not present

## 2019-08-27 DIAGNOSIS — N400 Enlarged prostate without lower urinary tract symptoms: Secondary | ICD-10-CM | POA: Diagnosis present

## 2019-08-27 DIAGNOSIS — F319 Bipolar disorder, unspecified: Secondary | ICD-10-CM | POA: Diagnosis present

## 2019-08-27 DIAGNOSIS — E11649 Type 2 diabetes mellitus with hypoglycemia without coma: Secondary | ICD-10-CM | POA: Diagnosis not present

## 2019-08-27 DIAGNOSIS — F419 Anxiety disorder, unspecified: Secondary | ICD-10-CM | POA: Diagnosis present

## 2019-08-27 DIAGNOSIS — Z888 Allergy status to other drugs, medicaments and biological substances status: Secondary | ICD-10-CM | POA: Diagnosis not present

## 2019-08-27 DIAGNOSIS — Z20822 Contact with and (suspected) exposure to covid-19: Secondary | ICD-10-CM | POA: Diagnosis present

## 2019-08-27 DIAGNOSIS — I132 Hypertensive heart and chronic kidney disease with heart failure and with stage 5 chronic kidney disease, or end stage renal disease: Secondary | ICD-10-CM | POA: Diagnosis not present

## 2019-08-27 DIAGNOSIS — J8410 Pulmonary fibrosis, unspecified: Secondary | ICD-10-CM | POA: Diagnosis present

## 2019-08-27 DIAGNOSIS — E875 Hyperkalemia: Secondary | ICD-10-CM | POA: Diagnosis not present

## 2019-08-27 DIAGNOSIS — E162 Hypoglycemia, unspecified: Secondary | ICD-10-CM | POA: Diagnosis not present

## 2019-08-27 DIAGNOSIS — G259 Extrapyramidal and movement disorder, unspecified: Secondary | ICD-10-CM | POA: Diagnosis present

## 2019-08-27 LAB — COMPREHENSIVE METABOLIC PANEL
ALT: 9 U/L (ref 0–44)
AST: 11 U/L — ABNORMAL LOW (ref 15–41)
Albumin: 2.8 g/dL — ABNORMAL LOW (ref 3.5–5.0)
Alkaline Phosphatase: 56 U/L (ref 38–126)
Anion gap: 8 (ref 5–15)
BUN: 36 mg/dL — ABNORMAL HIGH (ref 8–23)
CO2: 25 mmol/L (ref 22–32)
Calcium: 8.4 mg/dL — ABNORMAL LOW (ref 8.9–10.3)
Chloride: 107 mmol/L (ref 98–111)
Creatinine, Ser: 2.52 mg/dL — ABNORMAL HIGH (ref 0.61–1.24)
GFR calc Af Amer: 28 mL/min — ABNORMAL LOW (ref >60)
GFR calc non Af Amer: 24 mL/min — ABNORMAL LOW (ref >60)
Glucose, Bld: 127 mg/dL — ABNORMAL HIGH (ref 70–99)
Potassium: 5.5 mmol/L — ABNORMAL HIGH (ref 3.5–5.1)
Sodium: 140 mmol/L (ref 135–145)
Total Bilirubin: 0.4 mg/dL (ref 0.3–1.2)
Total Protein: 5.6 g/dL — ABNORMAL LOW (ref 6.5–8.1)

## 2019-08-27 LAB — PHOSPHORUS: Phosphorus: 4.4 mg/dL (ref 2.5–4.6)

## 2019-08-27 LAB — CBC
HCT: 31.5 % — ABNORMAL LOW (ref 39.0–52.0)
Hemoglobin: 9.7 g/dL — ABNORMAL LOW (ref 13.0–17.0)
MCH: 33 pg (ref 26.0–34.0)
MCHC: 30.8 g/dL (ref 30.0–36.0)
MCV: 107.1 fL — ABNORMAL HIGH (ref 80.0–100.0)
Platelets: 150 10*3/uL (ref 150–400)
RBC: 2.94 MIL/uL — ABNORMAL LOW (ref 4.22–5.81)
RDW: 13.7 % (ref 11.5–15.5)
WBC: 8.3 10*3/uL (ref 4.0–10.5)
nRBC: 0 % (ref 0.0–0.2)

## 2019-08-27 LAB — HEMOGLOBIN A1C
Hgb A1c MFr Bld: 5.1 % (ref 4.8–5.6)
Mean Plasma Glucose: 99.67 mg/dL

## 2019-08-27 LAB — APTT: aPTT: 39 seconds — ABNORMAL HIGH (ref 24–36)

## 2019-08-27 LAB — MAGNESIUM: Magnesium: 1.8 mg/dL (ref 1.7–2.4)

## 2019-08-27 LAB — GLUCOSE, CAPILLARY
Glucose-Capillary: 107 mg/dL — ABNORMAL HIGH (ref 70–99)
Glucose-Capillary: 65 mg/dL — ABNORMAL LOW (ref 70–99)
Glucose-Capillary: 106 mg/dL — ABNORMAL HIGH (ref 70–99)
Glucose-Capillary: 94 mg/dL (ref 70–99)
Glucose-Capillary: 121 mg/dL — ABNORMAL HIGH (ref 70–99)

## 2019-08-27 LAB — PROTIME-INR
INR: 1.2 (ref 0.8–1.2)
Prothrombin Time: 14.9 seconds (ref 11.4–15.2)

## 2019-08-27 MED ORDER — FERROUS SULFATE 325 (65 FE) MG PO TABS: 325.0000 mg | ORAL_TABLET | ORAL | Status: DC

## 2019-08-27 MED ORDER — ALLOPURINOL 100 MG PO TABS
100.0000 mg | ORAL_TABLET | Freq: Every day | ORAL | Status: DC
  Administered 2019-08-27 – 2019-08-28 (×2): 100 mg via ORAL
  Filled 2019-08-27 (×2): qty 1

## 2019-08-27 MED ORDER — VITAMIN D3 25 MCG (1000 UNIT) PO TABS
5000.0000 [IU] | ORAL_TABLET | Freq: Every day | ORAL | Status: DC
  Administered 2019-08-27 – 2019-08-28 (×2): 5000 [IU] via ORAL
  Filled 2019-08-27 (×4): qty 5

## 2019-08-27 MED ORDER — SODIUM ZIRCONIUM CYCLOSILICATE 10 G PO PACK
10.0000 g | PACK | Freq: Every day | ORAL | Status: DC
  Administered 2019-08-27: 10 g via ORAL
  Filled 2019-08-27: qty 1

## 2019-08-27 MED ORDER — HYDRALAZINE HCL 25 MG PO TABS
25.0000 mg | ORAL_TABLET | Freq: Two times a day (BID) | ORAL | Status: DC
  Administered 2019-08-27 – 2019-08-28 (×2): 25 mg via ORAL
  Filled 2019-08-27 (×2): qty 1

## 2019-08-27 MED ORDER — DIVALPROEX SODIUM ER 500 MG PO TB24
1500.0000 mg | ORAL_TABLET | Freq: Every day | ORAL | Status: DC
  Administered 2019-08-27: 1500 mg via ORAL
  Filled 2019-08-27: qty 3

## 2019-08-27 MED ORDER — OLANZAPINE 5 MG PO TABS
7.5000 mg | ORAL_TABLET | Freq: Every day | ORAL | Status: DC
  Administered 2019-08-27: 7.5 mg via ORAL
  Filled 2019-08-27: qty 2

## 2019-08-27 MED ORDER — FUROSEMIDE 10 MG/ML IJ SOLN
40.0000 mg | Freq: Once | INTRAMUSCULAR | Status: AC
  Administered 2019-08-27: 40 mg via INTRAVENOUS
  Filled 2019-08-27: qty 4

## 2019-08-27 NOTE — Progress Notes (Signed)
Patient blood sugar dropped to 65 this morning.Patient asymptomatic hypoglycemia protocol followed and 4 ounces of ginger ale given blood sugar rechecked up to 107. Will report off to oncoming shift.

## 2019-08-27 NOTE — Consult Note (Signed)
[facility] Renal Consultation Note  Requesting MD: Fayrene Helper, MD Indication for Consultation: CKD, hyperkalemia  Chief complaint: directed to ER for elevated potassium as outpatient  HPI:  Brent [name] is a 74 y.o. male with a history including chronic kidney disease, Crohn's disease, hypertension, and systolic CHF who presented to the hospital after outpatient labs with CHF demonstrated hyperkalemia with a potassium of 6.7.  Was a recheck as had been 6.1 on 7/1 and was noted at that time to be possibly hemolyzed.  Peak K here of 6.9.  Per charting he had previously reported lightheadedness but denied lightheadedness here.  He was given Lokelma x 2 doses as well as bicarbonate; got insulin/d50 per ER.  he got another dose of lokelma today.  He follows with Dr. Posey Pronto at [facility] for his CKD.  With regard to his outpatient trends, his last labs at [place] were 04/29/2019 creatinine of 2.00, EGFR 37, K4.0.  Prior to that, on 12/30/2018 his creatinine was 2.31 and potassium 3.5.  Note this admission hbA1c normal at 5.1.  Per 04/2019 office note, the patient was instructed per cardiology to stop lasix. Per 08/16/19 CHF clinic note plan was to add spironolactone if stable - he states he never was instructed to start this though.  He ate two watermelons over a 2 day period last week and has had tomato products.  States he's been feeling short of breath for some time.  Does feel a little swollen.   Creatinine  Date/Time Value Ref Range Status  12/30/2018 12:00 AM 2.3 (A) 0.6 - 1.3 Final  09/28/2018 12:00 AM 2.1 (A) 0.6 - 1.3 Final  06/14/2018 12:00 AM 3.1 (A) 0.6 - 1.3 Final  06/11/2017 12:00 AM 1.8 (A) 0.6 - 1.3 Final  11/19/2011 09:52 AM 1.8 (H) 0.7 - 1.3 mg/dL Final   Creatinine, Ser  Date/Time Value Ref Range Status  08/27/2019 02:01 AM 2.52 (H) 0.61 - 1.24 mg/dL Final  08/26/2019 08:43 PM 2.61 (H) 0.61 - 1.24 mg/dL Final  08/26/2019 07:43 PM 2.68 (H) 0.61 - 1.24  mg/dL Final  08/26/2019 05:09 PM 2.68 (H) 0.61 - 1.24 mg/dL Final  08/26/2019 12:37 PM 2.49 (H) 0.61 - 1.24 mg/dL Final  08/25/2019 02:29 PM 2.52 (H) 0.61 - 1.24 mg/dL Final  08/16/2019 12:26 PM 2.28 (H) 0.61 - 1.24 mg/dL Final  05/09/2019 09:12 AM 2.25 (H) 0.61 - 1.24 mg/dL Final  04/26/2019 05:24 AM 1.65 (H) 0.61 - 1.24 mg/dL Final  04/25/2019 06:00 AM 2.15 (H) 0.61 - 1.24 mg/dL Final  04/19/2019 10:41 AM 2.12 (H) 0.61 - 1.24 mg/dL Final  01/28/2019 09:54 AM 1.76 (H) 0.61 - 1.24 mg/dL Final  01/18/2019 12:31 PM 2.29 (H) 0.61 - 1.24 mg/dL Final  01/17/2019 08:56 AM 2.23 (H) 0.61 - 1.24 mg/dL Final  11/10/2018 03:04 PM 2.14 (H) 0.61 - 1.24 mg/dL Final  10/11/2018 12:53 PM 2.65 (H) 0.61 - 1.24 mg/dL Final  09/29/2018 11:08 AM 2.09 (H) 0.61 - 1.24 mg/dL Final  09/12/2018 05:38 AM 2.25 (H) 0.61 - 1.24 mg/dL Final  09/09/2018 04:43 AM 2.09 (H) 0.61 - 1.24 mg/dL Final  09/08/2018 02:48 AM 2.44 (H) 0.61 - 1.24 mg/dL Final  09/07/2018 05:08 PM 2.76 (H) 0.61 - 1.24 mg/dL Final  09/02/2018 05:14 AM 2.46 (H) 0.61 - 1.24 mg/dL Final  09/01/2018 06:08 AM 2.42 (H) 0.61 - 1.24 mg/dL Final  08/31/2018 01:34 AM 1.73 (H) 0.61 - 1.24 mg/dL Final  06/22/2018 10:11 AM 2.23 (H) 0.61 - 1.24  mg/dL Final  06/15/2018 12:25 PM 3.14 (H) 0.61 - 1.24 mg/dL Final  05/12/2018 06:29 AM 1.77 (H) 0.61 - 1.24 mg/dL Final  05/10/2018 03:30 AM 1.82 (H) 0.61 - 1.24 mg/dL Final  05/09/2018 02:51 AM 1.86 (H) 0.61 - 1.24 mg/dL Final  05/08/2018 02:56 AM 2.13 (H) 0.61 - 1.24 mg/dL Final  05/07/2018 03:36 AM 1.90 (H) 0.61 - 1.24 mg/dL Final  05/06/2018 02:52 AM 1.88 (H) 0.61 - 1.24 mg/dL Final  05/05/2018 05:02 AM 2.10 (H) 0.61 - 1.24 mg/dL Final  05/05/2018 03:43 AM 2.12 (H) 0.61 - 1.24 mg/dL Final  02/07/2018 01:13 PM 2.20 (H) 0.61 - 1.24 mg/dL Final  02/07/2018 12:55 PM 2.14 (H) 0.61 - 1.24 mg/dL Final  06/18/2017 12:14 PM 1.69 (H) 0.61 - 1.24 mg/dL Final  05/31/2017 12:03 PM 1.77 (H) 0.61 - 1.24 mg/dL Final  08/10/2015  01:22 PM 1.54 (H) 0.76 - 1.27 mg/dL Final  05/03/2015 01:29 AM 1.50 (H) 0.61 - 1.24 mg/dL Final  03/27/2015 01:30 PM 1.93 (H) 0.61 - 1.24 mg/dL Final  08/08/2014 10:32 AM 1.72 (H) 0.76 - 1.27 mg/dL Final  07/21/2014 09:48 AM 1.65 (H) 0.76 - 1.27 mg/dL Final  02/08/2014 08:43 AM 1.83 (H) 0.76 - 1.27 mg/dL Final  12/03/2012 03:30 PM 1.74 (H) 0.50 - 1.35 mg/dL Final  06/28/2012 02:00 PM 2.05 (H) 0.76 - 1.27 mg/dL Final  08/05/2011 03:54 PM 1.77 (H) 0.50 - 1.35 mg/dL Final  04/01/2011 02:44 PM 2.0 (H) 0.40 - 1.50 mg/dL Final  03/06/2011 01:55 PM 1.90 (H) 0.50 - 1.35 mg/dL Final  02/03/2011 02:12 PM 1.97 (H) 0.50 - 1.35 mg/dL Final  08/09/2010 02:47 PM 1.84 (H) 0.50 - 1.35 mg/dL Final  08/06/2010 03:16 PM 1.9 (H) 0.40 - 1.50 mg/dL Final     PMHx:   Past Medical History:  Diagnosis Date  . Anemia   . Anxiety   . Benign paroxysmal positional vertigo   . Bipolar I disorder, most recent episode (or current) unspecified   . Celiac disease   . CHF (congestive heart failure) ([place])   . Chronic kidney disease, stage III (moderate)   . Crohn's    Remicade q8 weeks  . Depression   . Essential and other specified forms of tremor    medication-induced Parkinson's, now resolved  . Essential hypertension, benign   . Gout 2018  . Heart failure ([place])   . Hypertrophy of prostate without urinary obstruction and other lower urinary tract symptoms (LUTS)   . Impotence of organic origin   . Insomnia with sleep apnea, unspecified   . Iron deficiency anemia, unspecified   . 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 Neuralgia, neuritis, and radiculitis, unspecified   . Other B-complex deficiencies   . Other extrapyramidal disease and abnormal movement disorder   . Postinflammatory pulmonary fibrosis ([place])   . Tobacco use disorder   . Vertigo 2018    Past Surgical History:  Procedure Laterality Date  . CATARACT EXTRACTION, BILATERAL Bilateral 09/2018  . CHOLECYSTECTOMY  07-12-2010  . RIGHT HEART CATH N/A  10/12/2018   Procedure: RIGHT HEART CATH;  Surgeon: Jolaine Artist, MD;  Location: [facility];  Service: Cardiovascular;  Laterality: N/A;  . RIGHT/LEFT HEART CATH AND CORONARY ANGIOGRAPHY N/A 04/26/2019   Procedure: RIGHT/LEFT HEART CATH AND CORONARY ANGIOGRAPHY;  Surgeon: Larey Dresser, MD;  Location: [facility];  Service: Cardiovascular;  Laterality: N/A;  . SMALL INTESTINE SURGERY     x 2    Family Hx:  Family History  Problem Relation Age of Onset  . Diabetes Mother        maternal grandmother  . Uterine cancer Mother   . Emphysema Father   . Pneumonia Maternal Grandmother   . Colon cancer Neg Hx     Social History:  reports that he quit smoking about 5 years ago. His smoking use included cigarettes. He started smoking about 63 years ago. He has a 49.00 pack-year smoking history. He has never used smokeless tobacco. He reports that he does not drink alcohol and does not use drugs.  Allergies:  Allergies  Allergen Reactions  . Azathioprine Other (See Comments)    REACTION: affected WBC "Almost died"  . Ciprofloxacin Other (See Comments)    Unknown rxn  . Levaquin [Levofloxacin In D5w] Other (See Comments)    Unknown rxn  . Plendil [Felodipine] Other (See Comments)    Unknown rxn    Medications: Prior to Admission medications   Medication Sig Start Date End Date Taking? Authorizing Provider  allopurinol (ZYLOPRIM) 100 MG tablet Take 2 tablets (200 mg total) by mouth daily. Patient taking differently: Take 100 mg by mouth daily.  01/05/19  Yes Reed, Tiffany L, DO  aspirin EC 81 MG tablet Take 1 tablet (81 mg total) by mouth daily. 04/19/19  Yes Larey Dresser, MD  Cholecalciferol (VITAMIN D) 125 MCG (5000 UT) CAPS Take 5,000 Units by mouth daily.    Yes [provider]  dapagliflozin propanediol (FARXIGA) 10 MG TABS tablet Take 1 tablet (10 mg total) by mouth daily before breakfast. 08/16/19  Yes Larey Dresser, MD  divalproex (DEPAKOTE ER)  500 MG 24 hr tablet Take 1,500 mg by mouth at bedtime.   Yes [provider]  epoetin alfa (PROCRIT) 39767 UNIT/ML injection Inject 10,000 Units into the skin every 30 (thirty) days.    Yes [provider]  ferrous sulfate 325 (65 FE) MG tablet Take 325 mg by mouth every Monday, Wednesday, and Friday.    Yes [provider]  hydrALAZINE (APRESOLINE) 25 MG tablet Take 1 tablet (25 mg total) by mouth 2 (two) times daily. 01/17/19  Yes Simmons, Brittainy M, PA-C  inFLIXimab (REMICADE) 100 MG injection Infuse Remicade IV schedule 1 45m/kg every 8 weeks Premedicate with Tylenol 500-65107mby mouth and Benadryl 25-5068my mouth prior to infusion. Last PPD was on 12/2009.  11/22/10  Yes JacMilus BanisterD  isosorbide mononitrate (IMDUR) 30 MG 24 hr tablet Take 1 tablet (30 mg total) by mouth daily. 12/28/18  Yes Bensimhon, DanShaune PascalD  ivabradine (CORLANOR) 5 MG TABS tablet Take 1 tablet (5 mg total) by mouth 2 (two) times daily with a meal. 06/29/19  Yes Bensimhon, DanShaune PascalD  loperamide (IMODIUM) 2 MG capsule Take 1 capsule (2 mg total) by mouth 4 (four) times daily as needed for diarrhea or loose stools. 01/18/19  Yes Fawze, Mina A, PA-C  magnesium oxide (MAG-OX) 400 MG tablet Take 800 mg by mouth in the morning, at noon, and at bedtime.    Yes [provider]  metoprolol succinate (TOPROL-XL) 25 MG 24 hr tablet Take 1 tablet (25 mg total) by mouth at bedtime. 05/09/19  Yes Simmons, Brittainy M, PA-C  OLANZapine (ZYPREXA) 7.5 MG tablet Take 1 tablet (7.5 mg total) by mouth at bedtime. 01/03/16  Yes Reed, Tiffany L, DO  omeprazole (PRILOSEC) 20 MG capsule Take 20 mg by mouth daily before breakfast.    Yes [provider]  tamsulosin (FLOMAX) 0.4  MG CAPS capsule Take 1 capsule (0.4 mg total) by mouth daily. 06/16/19  Yes Reed, Tiffany L, DO  triamcinolone (NASACORT ALLERGY 24HR) 55 MCG/ACT AERO nasal inhaler Place 2 sprays into the nose daily as needed (allergies).    Yes [provider]  vitamin B-12 (CYANOCOBALAMIN) 1000 MCG tablet Take 1,000 mcg by mouth daily.   Yes [provider]    I have reviewed the patient's current and reported prior to admission medications.  Labs:  BMP Latest Ref Rng & Units 08/27/2019 08/26/2019 08/26/2019  Glucose 70 - 99 mg/dL 127(H) 57(L) 88  BUN 8 - 23 mg/dL 36(H) 36(H) 35(H)  Creatinine 0.61 - 1.24 mg/dL 2.52(H) 2.61(H) 2.68(H)  BUN/Creat Ratio 6 - 22 (calc) - - -  Sodium 135 - 145 mmol/L 140 141 137  Potassium 3.5 - 5.1 mmol/L 5.5(H) 5.7(H) 6.9(HH)  Chloride 98 - 111 mmol/L 107 108 107  CO2 22 - 32 mmol/L 25 24 23   Calcium 8.9 - 10.3 mg/dL 8.4(L) 8.5(L) 8.8(L)    Urinalysis    Component Value Date/Time   COLORURINE YELLOW 08/26/2019 1944   APPEARANCEUR CLEAR 08/26/2019 1944   LABSPEC 1.012 08/26/2019 1944   PHURINE 7.0 08/26/2019 1944   GLUCOSEU >=500 (A) 08/26/2019 1944   HGBUR NEGATIVE 08/26/2019 1944   BILIRUBINUR NEGATIVE 08/26/2019 1944   BILIRUBINUR neg 10/03/2014 1152   KETONESUR NEGATIVE 08/26/2019 1944   PROTEINUR NEGATIVE 08/26/2019 1944   UROBILINOGEN negative 10/03/2014 1152   UROBILINOGEN 0.2 07/10/2010 1709   NITRITE NEGATIVE 08/26/2019 1944   LEUKOCYTESUR NEGATIVE 08/26/2019 1944     ROS:  Pertinent items noted in HPI and remainder of comprehensive ROS otherwise negative.    Physical Exam: Vitals:   08/27/19 0552 08/27/19 0922  BP: (!) 149/75 (!) 163/84  Pulse: 75 77  Resp: 18 18  Temp: 98.1 F (36.7 C) (!) 97.5 F (36.4 C)  SpO2: 95% 96%     General: adult male in bed in NAD HEENT: NCAT Eyes: EOMI sclera anicteric Neck: supple trachea midline Heart: S1S2 no rub Lungs: bibasilar crackles unlabored on room air Abdomen: soft/nt/nd  Extremities: no pitting edema appreciated Skin: no rash on extremities exposed Neuro: alert and oriented x 3 provides hx and follows commands  Assessment/Plan:  # Hyperkalemia - CKD, recent increase in dietary K intake, and  has stopped diuretics (being off of diuretics not really a terribly recent change though) - improved with medical measures  - discontinue scheduled lokelma - previously stable - lasix IV once now   - transition to renal diet here   # CKD stage 3 b - Baseline Cr appears 2-2.3 - Higher than baseline here - improving on repeat   # Chronic systolic CHF  - Lasix IV once now   # HTN  - lasix IV once   # Hypoglycemia  - monitoring per primary team   # Anemia of CKD  - on ESA/retacrit as an outpatient through our office - continue current regimen for now  dispo - would continue monitoring - anticipate d/c home tomorrow if ok with primary   Claudia Desanctis 08/27/2019, 2:11 PM

## 2019-08-27 NOTE — Progress Notes (Signed)
PROGRESS NOTE    Brent [name]  EXN:[id number] DOB: [birth year] DOA: 08/26/2019 PCP: Brent Curry, DO   Chief Complaint  Patient presents with  . Abnormal Lab    Brief Narrative:   Brent [name] is Brent [name] 74 y.o. male with medical history significant for Crohn's disease, CKD stage III (follows with nephrologist-Dr. Posey [name]), HFrEF (LVEF 20 to 25%-09/29/2018) who presents to the emergency department due to abnormal labs.  Patient complained of lightheadedness and some palpitations over the past several days, he had an outpatient lab testing done at his cardiologist office today which showed an elevated potassium level.  Patient states that he was called by his cardiologist to go to the ED for further evaluation and management based on the abnormal level of potassium.  He denies fever, chills, nausea, vomiting, chest pain or abdominal pain.  ED Course:  In the emergency department, he was hemodynamically stable.  Work-up in the ED showed macrocytic anemia, hyperkalemia, baseline creatinine 36/2.68 (creatinine has been trending up since last 3 months from 2.25).  Troponin I x 2 =  9, urinalysis was positive for glycosuria. Patient was provided with albuterol nebulizer treatment, IV insulin and D50, sodium bicarbonate and IV hydration with 500 mL of NS.  Hospitalist was asked to admit patient for further evaluation management.  Assessment & Plan:   Principal Problem:   Hyperkalemia Active Problems:   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HCC)   Acute renal failure superimposed on stage 3 chronic kidney disease (HCC)   Chronic systolic CHF (congestive heart failure) (HCC)   Type 2 diabetes mellitus with stage 3 chronic kidney disease (HCC)  Hyperkalemia  AKI on CKD IIIb Patient will be admitted to telemetry unit K+ 6.7 >6.6> 6.9> 5.7>5.5 Appreciate renal evaluation today -> suspect related to recent increase in dietary K intake and possibly related to stopping diuretics - given IV lasix x1 today - renal  diet Hold additional lokelma  Follow serial K  Baseline creatinine 2-2.3, 2.6 on admission, improving today, follow   Hypoglycemia possibly due to IV insulin Recurrent this AM.  Continue to monitor, work up further if recurrent  Macrocytic anemia Continue vitamin B12  Type 2 diabetes mellitus Continue insulin sliding scale and hypoglycemia protocol [name] will be temporarily held at this time  Chronic systolic CHF Continue ivabradine, metoprolol and isosorbide mononitrate  BPH Continue Flomax Rheumatrex updated  DVT prophylaxis: lovenox  Code Status: full  Family Communication: none at bedside Disposition:   Status is: Inpatient  Remains inpatient appropriate because:Inpatient level of care appropriate due to severity of illness   Dispo: The patient is from: Home              Anticipated d/c is to: Home              Anticipated d/c date is: 1 day              Patient currently is not medically stable to d/c.   Consultants:   nephrology  Procedures:   none  Antimicrobials:  Anti-infectives (From admission, onward)   None      Subjective: Feels ok today  Objective: Vitals:   08/26/19 2151 08/27/19 0201 08/27/19 0552 08/27/19 0922  BP: 122/76 116/73 (!) 149/75 (!) 163/84  Pulse: 90 84 75 77  Resp: 16 16 18 18   Temp: 97.7 F (36.5 C) 97.8 F (36.6 C) 98.1 F (36.7 C) (!) 97.5 F (36.4 C)  TempSrc: Oral Oral Oral Oral  SpO2: 94% 91%  95% 96%  Weight: 82.7 kg     Height:        Intake/Output Summary (Last 24 hours) at 08/27/2019 1605 Last data filed at 08/27/2019 1438 Gross per 24 hour  Intake 1100 ml  Output 900 ml  Net 200 ml   Filed Weights   08/26/19 1659 08/26/19 2151  Weight: 80.3 kg 82.7 kg    Examination:  General exam: Appears calm and comfortable  Respiratory system: Clear to auscultation. Respiratory effort normal. Cardiovascular system: S1 & S2 heard, RRR. No JVD, murmurs, rubs, gallops or clicks. No pedal  edema. Gastrointestinal system: Abdomen is nondistended, soft and nontender. No organomegaly or masses felt. Normal bowel sounds heard. Central nervous system: Alert and oriented. No focal neurological deficits. Extremities: Symmetric 5 x 5 power. Skin: No rashes, lesions or ulcers Psychiatry: Judgement and insight appear normal. Mood & affect appropriate.     Data Reviewed: I have personally reviewed following labs and imaging studies  CBC: Recent Labs  Lab 08/26/19 1709 08/27/19 0201  WBC 9.3 8.3  HGB 11.2* 9.7*  HCT 36.5* 31.5*  MCV 108.0* 107.1*  PLT 183 256    Basic Metabolic Panel: Recent Labs  Lab 08/26/19 1237 08/26/19 1709 08/26/19 1943 08/26/19 2043 08/27/19 0201  NA 138 138 137 141 140  K 6.7* 6.6* 6.9* 5.7* 5.5*  CL 106 107 107 108 107  CO2 24 22 23 24 25   GLUCOSE 92 114* 88 57* 127*  BUN 31* 36* 35* 36* 36*  CREATININE 2.49* 2.68* 2.68* 2.61* 2.52*  CALCIUM 9.0 8.7* 8.8* 8.5* 8.4*  MG  --   --   --   --  1.8  PHOS  --   --   --   --  4.4    GFR: Estimated Creatinine Clearance: 30.1 mL/min ([name]) (by C-G formula based on SCr of 2.52 mg/dL (H)).  Liver Function Tests: Recent Labs  Lab 08/27/19 0201  AST 11*  ALT 9  ALKPHOS 56  BILITOT 0.4  PROT 5.6*  ALBUMIN 2.8*    CBG: Recent Labs  Lab 08/26/19 2118 08/26/19 2312 08/27/19 0657 08/27/19 0719 08/27/19 1203  GLUCAP 183* 171* 65* 107* 106*     Recent Results (from the past 240 hour(s))  SARS Coronavirus 2 by RT PCR (hospital order, performed in [facility] hospital lab) Nasopharyngeal Nasopharyngeal Swab     Status: None   Collection Time: 08/26/19  8:34 PM   Specimen: Nasopharyngeal Swab  Result Value Ref Range Status   SARS Coronavirus 2 NEGATIVE NEGATIVE Final    Comment: (NOTE) SARS-CoV-2 target nucleic acids are NOT DETECTED.  The SARS-CoV-2 RNA is generally detectable in upper and lower respiratory specimens during the acute phase of infection. The lowest concentration of  SARS-CoV-2 viral copies this assay can detect is 250 copies / mL. [name] negative result does not preclude SARS-CoV-2 infection and should not be used as the sole basis for treatment or other patient management decisions.  [name] negative result may occur with improper specimen collection / handling, submission of specimen other than nasopharyngeal swab, presence of viral mutation(s) within the areas targeted by this assay, and inadequate number of viral copies (<250 copies / mL). Celesta Funderburk negative result must be combined with clinical observations, patient history, and epidemiological information.  Fact Sheet for Patients:   StrictlyIdeas.no  Fact Sheet for Healthcare Providers: BankingDealers.co.za  This test is not yet approved or  cleared by the [place] FDA and has been authorized for detection and/or  diagnosis of SARS-CoV-2 by FDA under an Emergency Use Authorization (EUA).  This EUA will remain in effect (meaning this test can be used) for the duration of the COVID-19 declaration under Section 564(b)(1) of the Act, 21 U.S.C. section 360bbb-3(b)(1), unless the authorization is terminated or revoked sooner.  Performed at [facility], [place] [address], [place],  [address]          Radiology Studies: No results found.      Scheduled Meds: . aspirin EC  81 mg Oral Daily  . enoxaparin (LOVENOX) injection  30 mg Subcutaneous Q24H  . insulin aspart  0-5 Units Subcutaneous QHS  . insulin aspart  0-9 Units Subcutaneous TID WC  . isosorbide mononitrate  30 mg Oral Daily  . ivabradine  5 mg Oral BID WC  . metoprolol succinate  25 mg Oral QHS  . pantoprazole  40 mg Oral Daily  . tamsulosin  0.4 mg Oral Daily  . vitamin B-12  1,000 mcg Oral Daily   Continuous Infusions:   LOS: 0 days    Time spent: over 30 min    Fayrene Helper, MD [facility]   To contact the attending provider between 7A-7P or the  covering provider during after hours 7P-7A, please log into the web site [email or web address] and access using universal [place] password for that web site. If you do not have the password, please call the hospital operator.  08/27/2019, 4:05 PM

## 2019-08-28 LAB — COMPREHENSIVE METABOLIC PANEL
ALT: 10 U/L (ref 0–44)
AST: 11 U/L — ABNORMAL LOW (ref 15–41)
Albumin: 3 g/dL — ABNORMAL LOW (ref 3.5–5.0)
Alkaline Phosphatase: 57 U/L (ref 38–126)
Anion gap: 8 (ref 5–15)
BUN: 34 mg/dL — ABNORMAL HIGH (ref 8–23)
CO2: 27 mmol/L (ref 22–32)
Calcium: 8.6 mg/dL — ABNORMAL LOW (ref 8.9–10.3)
Chloride: 103 mmol/L (ref 98–111)
Creatinine, Ser: 2.57 mg/dL — ABNORMAL HIGH (ref 0.61–1.24)
GFR calc Af Amer: 27 mL/min — ABNORMAL LOW (ref >60)
GFR calc non Af Amer: 24 mL/min — ABNORMAL LOW (ref >60)
Glucose, Bld: 97 mg/dL (ref 70–99)
Potassium: 5.3 mmol/L — ABNORMAL HIGH (ref 3.5–5.1)
Sodium: 138 mmol/L (ref 135–145)
Total Bilirubin: 0.4 mg/dL (ref 0.3–1.2)
Total Protein: 6.2 g/dL — ABNORMAL LOW (ref 6.5–8.1)

## 2019-08-28 LAB — CBC WITH DIFFERENTIAL/PLATELET
Abs Immature Granulocytes: 0.04 10*3/uL (ref 0.00–0.07)
Basophils Absolute: 0 10*3/uL (ref 0.0–0.1)
Basophils Relative: 0 %
Eosinophils Absolute: 0.4 10*3/uL (ref 0.0–0.5)
Eosinophils Relative: 5 %
HCT: 32.3 % — ABNORMAL LOW (ref 39.0–52.0)
Hemoglobin: 9.8 g/dL — ABNORMAL LOW (ref 13.0–17.0)
Immature Granulocytes: 0 %
Lymphocytes Relative: 45 %
Lymphs Abs: 4 10*3/uL (ref 0.7–4.0)
MCH: 32.9 pg (ref 26.0–34.0)
MCHC: 30.3 g/dL (ref 30.0–36.0)
MCV: 108.4 fL — ABNORMAL HIGH (ref 80.0–100.0)
Monocytes Absolute: 0.9 10*3/uL (ref 0.1–1.0)
Monocytes Relative: 10 %
Neutro Abs: 3.6 10*3/uL (ref 1.7–7.7)
Neutrophils Relative %: 40 %
Platelets: 152 10*3/uL (ref 150–400)
RBC: 2.98 MIL/uL — ABNORMAL LOW (ref 4.22–5.81)
RDW: 13.7 % (ref 11.5–15.5)
WBC: 9 10*3/uL (ref 4.0–10.5)
nRBC: 0 % (ref 0.0–0.2)

## 2019-08-28 LAB — PHOSPHORUS: Phosphorus: 3.9 mg/dL (ref 2.5–4.6)

## 2019-08-28 LAB — MAGNESIUM: Magnesium: 1.6 mg/dL — ABNORMAL LOW (ref 1.7–2.4)

## 2019-08-28 LAB — GLUCOSE, CAPILLARY
Glucose-Capillary: 90 mg/dL (ref 70–99)
Glucose-Capillary: 81 mg/dL (ref 70–99)

## 2019-08-28 MED ORDER — FUROSEMIDE 10 MG/ML IJ SOLN
40.0000 mg | Freq: Once | INTRAMUSCULAR | Status: AC
  Administered 2019-08-28: 40 mg via INTRAVENOUS
  Filled 2019-08-28: qty 4

## 2019-08-28 MED ORDER — MAGNESIUM OXIDE 400 (241.3 MG) MG PO TABS
400.0000 mg | ORAL_TABLET | Freq: Two times a day (BID) | ORAL | Status: DC
  Administered 2019-08-28: 400 mg via ORAL
  Filled 2019-08-28: qty 1

## 2019-08-28 NOTE — Discharge Summary (Signed)
Physician Discharge Summary  Brent [name] MHD:[id number] DOB: 30-Jul-1945 DOA: 08/26/2019  PCP: Brent Curry, DO  Admit date: 08/26/2019 Discharge date: 08/28/2019  Time spent: 40 minutes  Recommendations for Outpatient Follow-up:  1. Follow outpatient CBC/CMP 2. Follow renal diet/low potassium  3. Continue to hold lasix per outpatient provider, follow weight, volume status  4. Follow with renal outpatient  5. Follow with cardiology outpatient  Discharge Diagnoses:  Principal Problem:   Hyperkalemia Active Problems:   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HCC)   Acute renal failure superimposed on stage 3 chronic kidney disease (HCC)   Chronic systolic CHF (congestive heart failure) (HCC)   Type 2 diabetes mellitus with stage 3 chronic kidney disease ([place])   Discharge Condition: stable  Diet recommendation: renal diet  Filed Weights   08/26/19 1659 08/26/19 2151 08/27/19 2035  Weight: 80.3 kg 82.7 kg 84 kg    History of present illness:  Brent [name] 74 y.o.malewith medical history significant forCrohn's disease, CKD stage III (follows with nephrologist-Brent [name]),HFrEF(LVEF 20 to 25%-09/29/2018)who presents to the emergency department due to abnormal labs. Patient complained of lightheadedness and some palpitations over the past several days, he had an outpatient lab testing done at his cardiologist office today which showed an elevated potassium level. Patient states that he was called by his cardiologist to go to the ED for further evaluation and management based on the abnormal level of potassium. He denies fever, chills, nausea, vomiting, chest pain or abdominal pain.  ED Course: In the emergency department,he was hemodynamicallystable. Work-up in the ED showed macrocytic anemia, hyperkalemia, baseline creatinine 36/2.68 (creatinine has been trending up since last 3 months from 2.25). Troponin Ix 2 =9,urinalysis was positive for glycosuria.Patient was provided with  albuterol nebulizer treatment, IV insulin and D50, sodium bicarbonate and IV hydration with 500 mL of NS. Hospitalist was asked to admit patient for further evaluation management.  He was admitted for hyperkalemia.  He's improved with lokelma x2 and Brent [name] dose of lasix.  He's been seen by renal who suspects his elevated potassium is 2/2 recent increase in dietary potassium (?related to diuretics being stopped as well).  He's been educated on renal diet and will discharge with renal diet.  Hospital Course:  Hyperkalemia  AKI on CKD IIIb Patient will be admitted to telemetry unit K+ 6.7 >6.6> 6.9> 5.7>5.5 Appreciate renal evaluation -> suspect related to recent increase in dietary K intake and possibly related to stopping diuretics - given IV lasix x1 - renal diet - recommending renal diet outpatient, follow outpatient  Hold additional lokelma  Follow serial K  Baseline creatinine 2-2.3, 2.6 on admission, improving today, follow   Hypoglycemia possibly due to IV insulin Recurrent this AM.  Continue to monitor, work up further if recurrent  Macrocytic anemia Continue vitamin B12  Type 2 diabetes mellitus Continue insulin sliding scale and hypoglycemia protocol HomeFarxigawill be temporarily held at this time  Chronic systolic CHF Continue ivabradine, metoprolol and isosorbide mononitrate Doing ok off lasix, follow volume status, weights - discussed precautions for calling if he's developing swelling or SOB  BPH Continue Flomax   Procedures:  none  Consultations:  nephrology  Discharge Exam: Vitals:   08/28/19 0455 08/28/19 0859  BP: (!) 148/82 140/88  Pulse: 70 76  Resp: 14 16  Temp: 98.1 F (36.7 C) 98 F (36.7 C)  SpO2: 94% 96%   Feels ok No new complaints Ready to go home  General: No acute distress. Cardiovascular: Heart sounds show Brent [name]  regular rate, and rhythm.  Lungs: Clear to auscultation bilaterally Abdomen: Soft, nontender, nondistended Neurological:  Alert and oriented 3. Moves all extremities 4 with equal strength. Cranial nerves II through XII grossly intact. Skin: Warm and dry. No rashes or lesions. Extremities: No clubbing or cyanosis. No edema. Discharge Instructions   Discharge Instructions    (HEART FAILURE PATIENTS) Call MD:  Anytime you have any of the following symptoms: 1) 3 pound weight gain in 24 hours or 5 pounds in 1 week 2) shortness of breath, with or without Ltanya Bayley dry hacking cough 3) swelling in the hands, feet or stomach 4) if you have to sleep on extra pillows at night in order to breathe.   Complete by: As directed    Call MD for:  difficulty breathing, headache or visual disturbances   Complete by: As directed    Call MD for:  hives   Complete by: As directed    Call MD for:  persistant dizziness or light-headedness   Complete by: As directed    Call MD for:  persistant nausea and vomiting   Complete by: As directed    Call MD for:  redness, tenderness, or signs of infection (pain, swelling, redness, odor or green/yellow discharge around incision site)   Complete by: As directed    Call MD for:  severe uncontrolled pain   Complete by: As directed    Call MD for:  temperature >100.4   Complete by: As directed    Diet renal 60/70-03-28-1198   Complete by: As directed    Discharge instructions   Complete by: As directed    You were seen for high potassium.   This may have been related to you eating some foods high in potassium.  Continue [name] renal diet at home.    Continue to watch your weights at home.  Call renal or cardiology if your weights are increasing or if you have swelling or shortness of breath to discuss restarting lasix.  Please follow up with cardiology and nephrology as an outpatient.  Return for new, recurrent, or worsening symptoms.  Please ask your PCP to request records from this hospitalization so they know what was done and what the next steps will be.   Increase activity slowly   Complete  by: As directed      Allergies as of 08/28/2019      Reactions   Azathioprine Other (See Comments)   REACTION: affected WBC "Almost died"   Ciprofloxacin Other (See Comments)   Unknown rxn   Levaquin [levofloxacin In D5w] Other (See Comments)   Unknown rxn   Plendil [felodipine] Other (See Comments)   Unknown rxn      Medication List    TAKE these medications   allopurinol 100 MG tablet Commonly known as: ZYLOPRIM Take 2 tablets (200 mg total) by mouth daily. What changed: how much to take   aspirin EC 81 MG tablet Take 1 tablet (81 mg total) by mouth daily.   dapagliflozin propanediol 10 MG Tabs tablet Commonly known as: Farxiga Take 1 tablet (10 mg total) by mouth daily before breakfast.   Depakote ER 500 MG 24 hr tablet Generic drug: divalproex Take 1,500 mg by mouth at bedtime.   ferrous sulfate 325 (65 FE) MG tablet Take 325 mg by mouth every Monday, Wednesday, and Friday.   hydrALAZINE 25 MG tablet Commonly known as: APRESOLINE Take 1 tablet (25 mg total) by mouth 2 (two) times daily.   inFLIXimab 100 MG injection  Commonly known as: Remicade Infuse Remicade IV schedule 1 74m/kg every 8 weeks Premedicate with Tylenol 500-6559mby mouth and Benadryl 25-5019my mouth prior to infusion. Last PPD was on 12/2009.   isosorbide mononitrate 30 MG 24 hr tablet Commonly known as: IMDUR Take 1 tablet (30 mg total) by mouth daily.   ivabradine 5 MG Tabs tablet Commonly known as: CORLANOR Take 1 tablet (5 mg total) by mouth 2 (two) times daily with Karli Wickizer meal.   loperamide 2 MG capsule Commonly known as: IMODIUM Take 1 capsule (2 mg total) by mouth 4 (four) times daily as needed for diarrhea or loose stools.   magnesium oxide 400 MG tablet Commonly known as: MAG-OX Take 800 mg by mouth in the morning, at noon, and at bedtime.   metoprolol succinate 25 MG 24 hr tablet Commonly known as: TOPROL-XL Take 1 tablet (25 mg total) by mouth at bedtime.   Nasacort Allergy  24HR 55 MCG/ACT Aero nasal inhaler Generic drug: triamcinolone Place 2 sprays into the nose daily as needed (allergies).   OLANZapine 7.5 MG tablet Commonly known as: ZyPREXA Take 1 tablet (7.5 mg total) by mouth at bedtime.   omeprazole 20 MG capsule Commonly known as: PRILOSEC Take 20 mg by mouth daily before breakfast.   Procrit 10000 UNIT/ML injection Generic drug: epoetin alfa Inject 10,000 Units into the skin every 30 (thirty) days.   tamsulosin 0.4 MG Caps capsule Commonly known as: FLOMAX Take 1 capsule (0.4 mg total) by mouth daily.   vitamin B-12 1000 MCG tablet Commonly known as: CYANOCOBALAMIN Take 1,000 mcg by mouth daily.   Vitamin D 125 MCG (5000 UT) Caps Take 5,000 Units by mouth daily.      Allergies  Allergen Reactions  . Azathioprine Other (See Comments)    REACTION: affected WBC "Almost died"  . Ciprofloxacin Other (See Comments)    Unknown rxn  . Levaquin [Levofloxacin In D5w] Other (See Comments)    Unknown rxn  . Plendil [Felodipine] Other (See Comments)    Unknown rxn    Follow-up Information    Reed, Tiffany L, DO Follow up.   Specialty: Geriatric Medicine Contact information: [address] [phone]     JorMartiniqueeter M, MD .   Specialty: Cardiology Contact information: [phone]        McLLarey DresserD .   Specialty: Cardiology Contact information: [address] [phone]        PatElmarie ShileyD Follow up.   Specialty: Nephrology Contact information: [address] [phone]             The results of significant diagnostics from this hospitalization (including imaging, microbiology, ancillary and laboratory) are listed below for reference.    Significant Diagnostic Studies: No results found.  Microbiology: Recent Results (from the past 240 hour(s))  SARS Coronavirus 2  by RT PCR (hospital order[facility]) Nasopharyngeal Nasopharyngeal Swab     Status: None   Collection Time: 08/26/19  8:34 PM   Specimen: Nasopharyngeal Swab  Result Value Ref Range Status   SARS Coronavirus 2 NEGATIVE NEGATIVE Final    Comment: (NOTE) SARS-CoV-2 target nucleic acids are NOT DETECTED.  The SARS-CoV-2 RNA is generally detectable in upper and lower respiratory specimens during the acute phase of infection. The lowest concentration of SARS-CoV-2 viral copies this  assay can detect is 250 copies / mL. Amariana Mirando negative result does not preclude SARS-CoV-2 infection and should not be used as the sole basis for treatment or other patient management decisions.  Karrington Mccravy negative result may occur with improper specimen collection / handling, submission of specimen other than nasopharyngeal swab, presence of viral mutation(s) within the areas targeted by this assay, and inadequate number of viral copies (<250 copies / mL). Shauni Henner negative result must be combined with clinical observations, patient history, and epidemiological information.  Fact Sheet for Patients:   StrictlyIdeas.no  Fact Sheet for Healthcare Providers: BankingDealers.co.za  This test is not yet approved or  cleared by the [place] FDA and has been authorized for detection and/or diagnosis of SARS-CoV-2 by FDA under an Emergency Use Authorization (EUA).  This EUA will remain in effect (meaning this test can be used) for the duration of the COVID-19 declaration under Section 564(b)(1) of the Act, 21 U.S.C. section 360bbb-3(b)(1), unless the authorization is terminated or revoked sooner.  Performed at [facility], [place] [address], [place], [place] [address]      Labs: Basic Metabolic Panel: Recent Labs  Lab 08/26/19 1709 08/26/19 1943 08/26/19 2043 08/27/19 0201 08/28/19 0323  NA 138 137 141 140 138  K 6.6* 6.9* 5.7* 5.5* 5.3*  CL  107 107 108 107 103  CO2 22 23 24 25 27   GLUCOSE 114* 88 57* 127* 97  BUN 36* 35* 36* 36* 34*  CREATININE 2.68* 2.68* 2.61* 2.52* 2.57*  CALCIUM 8.7* 8.8* 8.5* 8.4* 8.6*  MG  --   --   --  1.8 1.6*  PHOS  --   --   --  4.4 3.9   Liver Function Tests: Recent Labs  Lab 08/27/19 0201 08/28/19 0323  AST 11* 11*  ALT 9 10  ALKPHOS 56 57  BILITOT 0.4 0.4  PROT 5.6* 6.2*  ALBUMIN 2.8* 3.0*   No results for input(s): LIPASE, AMYLASE in the last 168 hours. No results for input(s): AMMONIA in the last 168 hours. CBC: Recent Labs  Lab 08/26/19 1709 08/27/19 0201 08/28/19 0323  WBC 9.3 8.3 9.0  NEUTROABS  --   --  3.6  HGB 11.2* 9.7* 9.8*  HCT 36.5* 31.5* 32.3*  MCV 108.0* 107.1* 108.4*  PLT 183 150 152   Cardiac Enzymes: No results for input(s): CKTOTAL, CKMB, CKMBINDEX, TROPONINI in the last 168 hours. BNP: BNP (last 3 results) Recent Labs    08/31/18 0134 11/10/18 1504  BNP 1,257.9* 501.7*    ProBNP (last 3 results) No results for input(s): PROBNP in the last 8760 hours.  CBG: Recent Labs  Lab 08/27/19 1203 08/27/19 1639 08/27/19 2142 08/28/19 0642 08/28/19 1104  GLUCAP 106* 94 121* 90 81       Signed:  Fayrene Helper MD.  [facility] 08/28/2019, 5:11 PM

## 2019-08-28 NOTE — Discharge Instructions (Signed)
Food Basics for Chronic Kidney Disease When your kidneys are not working well, they cannot remove waste and excess substances from your blood as effectively as they did before. This can lead to Brent [name] buildup and imbalance of these substances, which can worsen kidney damage and affect how your body functions. Certain foods lead to Brent [name] buildup of these substances in the body. By changing your diet as recommended by your diet and nutrition specialist (dietitian) or health care provider, you could help prevent further kidney damage and delay or prevent the need for dialysis. What are tips for following this plan? General instructions   Work with your health care provider and dietitian to develop Brent [name] meal plan that is right for you. Foods you can eat, limit, or avoid will be different for each person depending on the stage of kidney disease and any other existing health conditions.  Talk with your health care provider about whether you should take Brent [name] vitamin and mineral supplement.  Use standard measuring cups and spoons to measure servings of foods. Use Brent [name] kitchen scale to measure portions of protein foods.  If directed by your health care provider, avoid drinking too much fluid. Measure and count all liquids, including water, ice, soups, flavored gelatin, and frozen desserts such as popsicles or ice cream. Reading food labels  Check the amount of sodium in foods. Choose foods that have less than 300 milligrams (mg) per serving.  Check the ingredient list for phosphorus or potassium-based additives or preservatives.  Check the amount of saturated and trans fat. Limit or avoid these fats as told by your dietitian. Shopping  Avoid buying foods that are: ? Processed, frozen, or prepackaged. ? Calcium-enriched or fortified.  Do not buy foods that have salt or sodium listed among the first five ingredients.  Do not buy canned vegetables. Cooking  Replace animal proteins, such as meat, fish, eggs, or  dairy, with plant proteins from beans, nuts, and soy. ? Use soy milk instead of cow's milk. ? Add beans or tofu to soups, casseroles, or pasta dishes instead of meat.  Soak vegetables, such as potatoes, before cooking to reduce potassium. To do this: ? Peel and cut into small pieces. ? Soak in warm water for at least 2 hours. For every 1 cup of vegetables, use 10 cups of water. ? Drain and rinse with warm water. ? Boil for at least 5 minutes. Meal planning  Limit the amount of protein from plant and animal sources you eat each day.  Do not add salt to food when cooking or before eating.  Eat meals and snacks at around the same time each day. If you have diabetes:  If you have diabetes (diabetes mellitus) and chronic kidney disease, it is important to keep your blood glucose in the target range recommended by your health care provider. Follow your diabetes management plan. This may include: ? Checking your blood glucose regularly. ? Taking oral medicines, insulin, or both. ? Exercising for at least 30 minutes on 5 or more days each week, or as told by your health care provider. ? Tracking how many servings of carbohydrates you eat at each meal.  You may be given specific guidelines on how much of certain foods and nutrients you may eat, depending on your stage of kidney disease and whether you have high blood pressure (hypertension). Follow your meal plan as told by your dietitian. What nutrients should be limited? The items listed are not Brent [name] complete list. Talk with your dietitian  about what dietary choices are best for you. Potassium Potassium affects how steadily your heart beats. If too much potassium builds up in your blood, it can cause an irregular heartbeat or even Brent [name] heart attack. You may need to eat less potassium, depending on your blood potassium levels and the stage of kidney disease. Talk to your dietitian about how much potassium you may have each day. You may need to limit  or avoid foods that are high in potassium, such as:  Milk and soy milk.  Fruits, such as bananas, papaya, apricots, nectarines, melon, prunes, raisins, kiwi, and oranges.  Vegetables, such as potatoes, sweet potatoes, yams, tomatoes, leafy greens, beets, okra, avocado, pumpkin, and winter squash.  White and lima beans. Phosphorus Phosphorus is Brent [name] mineral found in your bones. Brent [name] balance between calcium and phosphorous is needed to build and maintain healthy bones. Too much phosphorus pulls calcium from your bones. This can make your bones weak and more likely to break. Too much phosphorus can also make your skin itch. You may need to eat less phosphorus depending on your blood phosphorus levels and the stage of kidney disease. Talk to your dietitian about how much potassium you may have each day. You may need to take medicine to lower your blood phosphorus levels if diet changes do not help. You may need to limit or avoid foods that are high in phosphorus, such as:  Milk and dairy products.  Dried beans and peas.  Tofu, soy milk, and other soy-based meat replacements.  Colas.  Nuts and peanut butter.  Meat, poultry, and fish.  Bran cereals and oatmeals. Protein Protein helps you to make and keep muscle. It also helps in the repair of your body's cells and tissues. One of the natural breakdown products of protein is Brent [name] waste product called urea. When your kidneys are not working properly, they cannot remove wastes, such as urea, like they did before you developed chronic kidney disease. Reducing how much protein you eat can help prevent Brent [name] buildup of urea in your blood. Depending on your stage of kidney disease, you may need to limit foods that are high in protein. Sources of animal protein include:  Meat (all types).  Fish and seafood.  Poultry.  Eggs.  Dairy. Other protein foods include:  Beans and legumes.  Nuts and nut butter.  Soy and tofu. Sodium Sodium, which is found  in salt, helps maintain Brent [name] healthy balance of fluids in your body. Too much sodium can increase your blood pressure and have Brent [name] negative effect on the function of your heart and lungs. Too much sodium can also cause your body to retain too much fluid, making your kidneys work harder. Most people should have less than 2,300 milligrams (mg) of sodium each day. If you have hypertension, you may need to limit your sodium to 1,500 mg each day. Talk to your dietitian about how much sodium you may have each day. You may need to limit or avoid foods that are high in sodium, such as:  Salt seasonings.  Soy sauce.  Cured and processed meats.  Salted crackers and snack foods.  Fast food.  Canned soups and most canned foods.  Pickled foods.  Vegetable juice.  Boxed mixes or ready-to-eat boxed meals and side dishes.  Bottled dressings, sauces, and marinades. Summary  Chronic kidney disease can lead to Brent [name] buildup and imbalance of waste and excess substances in the body. Certain foods lead to Brent [name] buildup of these substances. By adjusting  your intake of these foods, you could help prevent more kidney damage and delay or prevent the need for dialysis.  Food adjustments are different for each person with chronic kidney disease. Work with Brent [name] dietitian to set up nutrient goals and Brent [name] meal plan that is right for you.  If you have diabetes and chronic kidney disease, it is important to keep your blood glucose in the target range recommended by your health care provider. This information is not intended to replace advice given to you by your health care provider. Make sure you discuss any questions you have with your health care provider. Document Revised: 06/03/2018 Document Reviewed: 02/06/2016 Elsevier Patient Education  2020 [employer].

## 2019-08-28 NOTE — Progress Notes (Signed)
DISCHARGE NOTE HOME Brent [name] to be discharged Home per MD order. Discussed prescriptions and follow up appointments with the patient. Prescriptions given to patient; medication list explained in detail. Patient verbalized understanding.  Skin clean, dry and intact without evidence of skin break down, no evidence of skin tears noted. IV catheter discontinued intact. Site without signs and symptoms of complications. Dressing and pressure applied. Pt denies pain at the site currently. No complaints noted.  Patient free of lines, drains, and wounds.   An After Visit Summary (AVS) was printed and given to the patient. Patient escorted via wheelchair, and discharged home via private auto.  Orville Govern, RN

## 2019-08-28 NOTE — Progress Notes (Signed)
[facility] Progress Note  Name: Brent [name] MRN: [id number] DOB: August 31, 1945  Chief Complaint:  Labs with high potassium  Subjective:  He had 1.8 liters UOP over 7/3.  Got lasix 40 mg IV once.  States that his breathing is doing good now.  He states had been doing good at home off lasix.  We reviewed high K food again and he repeated back to me.  States he is going to see his PCP in week.   Review of systems:  Denies shortness of breath  Denies chest pain Denies n/v -------- Background on consult:  Brent [name] is a 74 y.o. male with a history including chronic kidney disease, Crohn's disease, hypertension, and systolic CHF who presented to the hospital after outpatient labs with CHF demonstrated hyperkalemia with a potassium of 6.7.  Was a recheck as had been 6.1 on 7/1 and was noted at that time to be possibly hemolyzed.  Peak K here of 6.9.  Per charting he had previously reported lightheadedness but denied lightheadedness here.  He was given Lokelma x 2 doses as well as bicarbonate; got insulin/d50 per ER.  he got another dose of lokelma today.  He follows with Dr. Posey Pronto at [facility] for his CKD.  With regard to his outpatient trends, his last labs at [facility] were 04/29/2019 creatinine of 2.00, EGFR 37, K4.0.  Prior to that, on 12/30/2018 his creatinine was 2.31 and potassium 3.5.  Note this admission hbA1c normal at 5.1.  Per 04/2019 office note, the patient was instructed per cardiology to stop lasix. Per 08/16/19 CHF clinic note plan was to add spironolactone if stable - he states he never was instructed to start this though.  He ate two watermelons over a 2 day period last week and has had tomato products.  States he's been feeling short of breath for some time.  Does feel a little swollen.   Intake/Output Summary (Last 24 hours) at 08/28/2019 1159 Last data filed at 08/28/2019 1055 Gross per 24 hour  Intake 1242 ml  Output 1900 ml  Net -658 ml    Vitals:   Vitals:   08/27/19 1500 08/27/19 2035 08/28/19 0455 08/28/19 0859  BP: 131/83 (!) 151/85 (!) 148/82 140/88  Pulse: 69 66 70 76  Resp: 18 14 14 16   Temp: 97.7 F (36.5 C) 97.7 F (36.5 C) 98.1 F (36.7 C) 98 F (36.7 C)  TempSrc: Oral Oral Oral Oral  SpO2: 95% 96% 94% 96%  Weight:  84 kg    Height:         Physical Exam:  General adult male in bed in no acute distress HEENT normocephalic atraumatic extraocular movements intact sclera anicteric Neck supple trachea midline Lungs clear to auscultation bilaterally normal work of breathing at rest  Heart S1S2 no rub Abdomen soft nontender nondistended Extremities trace if any edema  Psych normal mood and affect   Medications reviewed   Labs:  BMP Latest Ref Rng & Units 08/28/2019 08/27/2019 08/26/2019  Glucose 70 - 99 mg/dL 97 127(H) 57(L)  BUN 8 - 23 mg/dL 34(H) 36(H) 36(H)  Creatinine 0.61 - 1.24 mg/dL 2.57(H) 2.52(H) 2.61(H)  BUN/Creat Ratio 6 - 22 (calc) - - -  Sodium 135 - 145 mmol/L 138 140 141  Potassium 3.5 - 5.1 mmol/L 5.3(H) 5.5(H) 5.7(H)  Chloride 98 - 111 mmol/L 103 107 108  CO2 22 - 32 mmol/L 27 25 24   Calcium 8.9 - 10.3 mg/dL 8.6(L) 8.4(L) 8.5(L)  Assessment/Plan:   # Hyperkalemia - CKD, recent increase in dietary K intake, and has stopped diuretics (being off of diuretics not really a terribly recent change though) - improved with medical measures  - defer scheduled lokelma as previously stable; k was low/controlled on lasix on was 5.0 (05/09/19) and 4.5 (08/16/19) on checks at [facility] after stopping - recommend renal diet here and at home  # CKD stage 3 b - Baseline Cr appears 2-2.3 - a little higher than baseline here    # Chronic systolic CHF  - had Lasix IV once inpatient after fluid administration - lasix IV once today again to optimize - Continue fluid restriction - today he tells me has been doing ok off of lasix at home but does have it if he needs for shortness of breath.  He's not sure of his  dose.  Asked him to call our office and let us know if trouble with shortness of breath or swelling; note he also follows with CHF   # HTN  - For lasix IV   # Hypoglycemia - resolved - monitoring per primary team   # Anemia of CKD  - on ESA/retacrit as an outpatient through our office - continue current regimen for now  dispo - disposition per primary team.  Cleared from a renal standpoint.  He states he will follow-up with his PCP in a week - would recommend labs (BMP) at that visit and please send lab results to [facility].  Will ensure that he has appropriate follow-up scheduled with Dr. Posey Pronto at [place], [place] 08/28/2019 12:26 PM

## 2019-08-30 ENCOUNTER — Telehealth: Payer: Self-pay

## 2019-08-30 LAB — GLUCOSE, CAPILLARY: Glucose-Capillary: 41 mg/dL — CL (ref 70–99)

## 2019-08-30 NOTE — Telephone Encounter (Signed)
Transition Care Management Follow-Up Telephone Call   Date discharged and where: Brent [name] 08/28/2019  How have you been since you were released from the hospital? Per patient he feels alright  Any patient concerns? Requesting dietary information for Kidneys (Renal Diet)  Items Reviewed:   Meds: Yes  Allergies: Yes  Dietary Changes Reviewed: Yes  Functional Questionnaire:  Independent-I Dependent-D  ADLs:   Dressing- I    Eating- I   Maintaining continence- I   Transferring- I   Transportation- I   Meal Prep- D   Managing Meds- D, parametrics help with filling pill box once weekly   Confirmed importance and Date/Time of follow-up visits scheduled: 09/02/2019 @ 10:30 am with Marlowe Sax, NP   Confirmed with patient if condition worsens to call PCP or go to the Emergency Dept. Patient was given office number and encouraged to call back with questions or concerns:

## 2019-08-31 ENCOUNTER — Other Ambulatory Visit (HOSPITAL_COMMUNITY): Payer: Self-pay

## 2019-08-31 NOTE — Progress Notes (Signed)
Paramedicine Encounter    Patient ID: Brent [name], male    DOB: 14-Dec-1945, 74 y.o.   MRN: [id number]   Patient Care Team: Gayland Curry, DO as PCP - General (Geriatric Medicine) [place], Peter M, MD as PCP - Cardiology (Cardiology) Larey Dresser, MD as PCP - Advanced Heart Failure (Cardiology) Elmarie Shiley, MD (Nephrology) Milus Banister, MD (Gastroenterology)  Patient Active Problem List   Diagnosis Date Noted  . Hyperkalemia 08/26/2019  . Chronic systolic CHF (congestive heart failure) ([place]) 08/26/2019  . Type 2 diabetes mellitus with stage 3 chronic kidney disease ([place]) 08/26/2019  . Chronic obstructive pulmonary disease ([place]) 06/20/2019  . Ventricular tachycardia ([place]) 06/20/2019  . Acute on chronic systolic (congestive) heart failure ([place]) 04/25/2019  . Metabolic acidosis 40/81/4481  . Orthostasis 09/07/2018  . AKI (acute kidney injury) ([place])   . Immunosuppressed status ([place])   . Macrocytic anemia   . Chronic combined systolic and diastolic congestive heart failure ([place]) 06/03/2018  . Candida esophagitis ([place]) 09/22/2017  . History of smoking 30 or more pack years 01/05/2017  . Bipolar 1 disorder ([place])   . BPH (benign prostatic hyperplasia) 03/31/2013  . 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 Essential hypertension, benign 03/31/2013  . Acute renal failure superimposed on stage 3 chronic kidney disease ([place]) 06/04/2012  . 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[place]) 01/23/2010    Current Outpatient Medications:  .  allopurinol (ZYLOPRIM) 100 MG tablet, Take 2 tablets (200 mg total) by mouth daily., Disp: 60 tablet, Rfl: 5 .  aspirin EC 81 MG tablet, Take 1 tablet (81 mg total) by mouth daily., Disp: 90 tablet, Rfl: 3 .  Cholecalciferol (VITAMIN D) 125 MCG (5000 UT) CAPS, Take 5,000 Units by mouth daily. , Disp: , Rfl:  .  dapagliflozin propanediol (FARXIGA) 10 MG TABS tablet, Take 1 tablet (10 mg total) by mouth daily before breakfast., Disp: 30 tablet, Rfl: 6 .   divalproex (DEPAKOTE ER) 500 MG 24 hr tablet, Take 1,500 mg by mouth at bedtime., Disp: , Rfl:  .  epoetin alfa (PROCRIT) 85631 UNIT/ML injection, Inject 10,000 Units into the skin every 30 (thirty) days. , Disp: , Rfl:  .  ferrous sulfate 325 (65 FE) MG tablet, Take 325 mg by mouth every Monday, Wednesday, and Friday. , Disp: , Rfl:  .  hydrALAZINE (APRESOLINE) 25 MG tablet, Take 1 tablet (25 mg total) by mouth 2 (two) times daily., Disp: 60 tablet, Rfl: 3 .  inFLIXimab (REMICADE) 100 MG injection, Infuse Remicade IV schedule 1 22m/kg every 8 weeks Premedicate with Tylenol 500-6574mby mouth and Benadryl 25-5053my mouth prior to infusion. Last PPD was on 12/2009. , Disp: 1 each, Rfl: 6 .  isosorbide mononitrate (IMDUR) 30 MG 24 hr tablet, Take 1 tablet (30 mg total) by mouth daily., Disp: 90 tablet, Rfl: 3 .  ivabradine (CORLANOR) 5 MG TABS tablet, Take 1 tablet (5 mg total) by mouth 2 (two) times daily with a meal., Disp: 180 tablet, Rfl: 3 .  loperamide (IMODIUM) 2 MG capsule, Take 1 capsule (2 mg total) by mouth 4 (four) times daily as needed for diarrhea or loose stools., Disp: 12 capsule, Rfl: 0 .  magnesium oxide (MAG-OX) 400 MG tablet, Take 800 mg by mouth in the morning, at noon, and at bedtime. , Disp: , Rfl:  .  metoprolol succinate (TOPROL-XL) 25 MG 24 hr tablet, Take 1 tablet (25 mg total) by mouth at bedtime., Disp: 90 tablet, Rfl: 3 .  OLANZapine (ZYPREXA) 7.5 MG tablet, Take 1 tablet (7.5 mg total) by mouth at bedtime., Disp: 30 tablet, Rfl: 11 .  omeprazole (PRILOSEC) 20 MG capsule, Take 20 mg by mouth daily before breakfast. , Disp: , Rfl:  .  tamsulosin (FLOMAX) 0.4 MG CAPS capsule, Take 1 capsule (0.4 mg total) by mouth daily., Disp: 30 capsule, Rfl: 2 .  triamcinolone (NASACORT ALLERGY 24HR) 55 MCG/ACT AERO nasal inhaler, Place 2 sprays into the nose daily as needed (allergies)., Disp: , Rfl:  .  vitamin B-12 (CYANOCOBALAMIN) 1000 MCG tablet, Take 1,000 mcg by mouth daily., Disp:  , Rfl:  Allergies  Allergen Reactions  . Azathioprine Other (See Comments)    REACTION: affected WBC "Almost died"  . Ciprofloxacin Other (See Comments)    Unknown rxn  . Levaquin [Levofloxacin In D5w] Other (See Comments)    Unknown rxn  . Plendil [Felodipine] Other (See Comments)    Unknown rxn     Social History   Socioeconomic History  . Marital status: Divorced    Spouse name: Not on file  . Number of children: 1  . Years of education: 54  . Highest education level: Not on file  Occupational History  . Occupation: retired  . Occupation: Veteran  Tobacco Use  . Smoking status: Former Smoker    Packs/day: 1.00    Years: 49.00    Pack years: 49.00    Types: Cigarettes    Start date: 04/11/1956    Quit date: 04/17/2014    Years since quitting: 5.3  . Smokeless tobacco: Never Used  Vaping Use  . Vaping Use: Never used  Substance and Sexual Activity  . Alcohol use: No    Alcohol/week: 0.0 standard drinks  . Drug use: No  . Sexual activity: Never  Other Topics Concern  . Not on file  Social History Narrative  . Not on file   Social Determinants of Health   Financial Resource Strain:   . Difficulty of Paying Living Expenses:   Food Insecurity:   . Worried About Charity fundraiser in the Last Year:   . Arboriculturist in the Last Year:   Transportation Needs:   . Film/video editor (Medical):   Marland Kitchen Lack of Transportation (Non-Medical):   Physical Activity:   . Days of Exercise per Week:   . Minutes of Exercise per Session:   Stress:   . Feeling of Stress :   Social Connections:   . Frequency of Communication with Friends and Family:   . Frequency of Social Gatherings with Friends and Family:   . Attends Religious Services:   . Active Member of Clubs or Organizations:   . Attends Archivist Meetings:   Marland Kitchen Marital Status:   Intimate Partner Violence:   . Fear of Current or Ex-Partner:   . Emotionally Abused:   Marland Kitchen Physically Abused:   .  Sexually Abused:     Physical Exam      Future Appointments  Date Time Provider [place]  09/02/2019 10:30 AM [name], [name], NP [facility] None  09/16/2019 10:00 AM [facility] RM 2 [facility] None  10/20/2019 11:00 AM Hollace Kinnier L, DO [facility] None  11/14/2019  1:30 PM [facility] PA/NP [facility] None  01/24/2020  2:15 PM [name], [name], NP [facility] None   Arrived for home visit for Jimmy who was seated in his home alert and oriented with no complaints stating he is feeling much better this week than last week  while in the hospital. Laverna Peace was seen for high potassium after having labs drawn last week. Laverna Peace has follow up with his NP this Friday. Vitals obtained and are as noted. Medications were reviewed and confirmed. 2 pill boxes filled due to my absence next week. Patient has no complaints and understands how to transition between two pill boxes. Home visit complete.   Refills: [place] in regards to patient assistance for [place].   ACTION: Home visit completed Next visit planned for two weeks.

## 2019-09-02 ENCOUNTER — Telehealth (HOSPITAL_COMMUNITY): Payer: Self-pay | Admitting: Pharmacy Technician

## 2019-09-02 ENCOUNTER — Encounter: Payer: Medicare Other | Admitting: Family

## 2019-09-02 NOTE — Telephone Encounter (Signed)
Heather with paramedicine sent me a message about getting [name] assistance with Brent [name]. Our plan was to sign him up for the PAN once he used the last of the available funds but the PAN foundation closed before his funds were exhausted.  Started an application for patient assistance.  Will follow up.

## 2019-09-05 DIAGNOSIS — B351 Tinea unguium: Secondary | ICD-10-CM | POA: Diagnosis not present

## 2019-09-05 DIAGNOSIS — I739 Peripheral vascular disease, unspecified: Secondary | ICD-10-CM | POA: Diagnosis not present

## 2019-09-06 ENCOUNTER — Other Ambulatory Visit: Payer: Self-pay

## 2019-09-06 ENCOUNTER — Encounter: Payer: Self-pay | Admitting: Family

## 2019-09-06 ENCOUNTER — Ambulatory Visit (INDEPENDENT_AMBULATORY_CARE_PROVIDER_SITE_OTHER): Payer: Medicare Other | Admitting: Family

## 2019-09-06 VITALS — BP 116/74 | HR 74 | Temp 97.5°F | Resp 16 | Ht 76.0 in | Wt 181.0 lb

## 2019-09-06 DIAGNOSIS — E875 Hyperkalemia: Secondary | ICD-10-CM | POA: Diagnosis not present

## 2019-09-06 DIAGNOSIS — H1032 Unspecified acute conjunctivitis, left eye: Secondary | ICD-10-CM

## 2019-09-06 DIAGNOSIS — I10 Essential (primary) hypertension: Secondary | ICD-10-CM

## 2019-09-06 DIAGNOSIS — I5042 Chronic combined systolic (congestive) and diastolic (congestive) heart failure: Secondary | ICD-10-CM

## 2019-09-06 DIAGNOSIS — N184 Chronic kidney disease, stage 4 (severe): Secondary | ICD-10-CM | POA: Diagnosis not present

## 2019-09-06 MED ORDER — GENTAMICIN SULFATE 0.3 % OP SOLN: 1.0000 [drp] | Freq: Three times a day (TID) | OPHTHALMIC | 0 refills | Status: AC

## 2019-09-06 NOTE — Progress Notes (Signed)
Provider:   FNP-C  Gayland Curry, DO  Patient Care Team: Gayland Curry, DO as PCP - General (Geriatric Medicine) Martinique, Peter M, MD as PCP - Cardiology (Cardiology) Larey Dresser, MD as PCP - Advanced Heart Failure (Cardiology) Elmarie Shiley, MD (Nephrology) Milus Banister, MD (Gastroenterology)  Extended Emergency Contact Information Primary Emergency Contact: downing,rosa Mobile Phone: [phone] Relation: Sister  Code Status: Full Code  Goals of care: Advanced Directive information Advanced Directives 09/06/2019  Does Patient Have a Medical Advance Directive? Yes  Type of Advance Directive -  Does patient want to make changes to medical advance directive? -  Copy of [place] in Chart? -  Would patient like information on creating a medical advance directive? No - Patient declined  Pre-existing out of facility DNR order (yellow form or pink MOST form) -     Chief Complaint  Patient presents with   Transitions Of Care    Hospial Follow Up for High Potassium    HPI:  Pt is a 74 y.o. male seen today for an acute visit for transition of care following 08/26/2019 - 08/28/2019 for abnormal labs.He had labs with cardiologist was told to go to ED.Potassium level was 5.5 > 5.3,mg 1.6.He had some lightheadedness and some palpitation several days prior.He was hemodynamically stable in the  ED.Work up showed macrocytic anemia,Hyperkalemia,CR 2.62 at baseline.Troponin x 2 was negative. He follows up with Nephrologist for worsening renal function.His urine specimen was positive for glycosuria.He was treated with IVF 500 ml of Normal saline.He was admitted in telemetry unit for Hyperkalemia.He was treated with I.V lasix.His elevated K+ was thought possible from increase dietary potassium and recent stopped diuretics as well.He was educated on renal diet and discharged.He was advised follow up outpatient with Nephrologist and cardiologist.Also recommended  to continue to hold lasix and monitor weight.CBC/CMP with PCP.He has a significant medical history of Type 2 DM,chronic systolic CHF,BPH,Crohn's disease,CKD stage 3 among other condition.  He complains of left eye yellow drainage.States unable to open left eye in the mornings.He denies any vision impairment,redness,pain or itching in the eye.   Past Medical History:  Diagnosis Date   Anemia    Anxiety    Benign paroxysmal positional vertigo    Bipolar I disorder, most recent episode (or current) unspecified    Celiac disease    CHF (congestive heart failure) (HCC)    Chronic kidney disease, stage III (moderate)    Crohn's    Remicade q8 weeks   Depression    Essential and other specified forms of tremor    medication-induced Parkinson's, now resolved   Essential hypertension, benign    Gout 2018   Heart failure ([place])    Hypertrophy of prostate without urinary obstruction and other lower urinary tract symptoms (LUTS)    Impotence of organic origin    Insomnia with sleep apnea, unspecified    Iron deficiency anemia, unspecified    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   Neuralgia, neuritis, and radiculitis, unspecified    Other B-complex deficiencies    Other extrapyramidal disease and abnormal movement disorder    Postinflammatory pulmonary fibrosis ([place])    Tobacco use disorder    Vertigo 2018   Past Surgical History:  Procedure Laterality Date   CATARACT EXTRACTION, BILATERAL Bilateral 09/2018   CHOLECYSTECTOMY  07-12-2010   RIGHT HEART CATH N/A 10/12/2018   Procedure: RIGHT HEART CATH;  Surgeon: Jolaine Artist, MD;  Location: [facility];  Service: Cardiovascular;  Laterality:  N/A;   RIGHT/LEFT HEART CATH AND CORONARY ANGIOGRAPHY N/A 04/26/2019   Procedure: RIGHT/LEFT HEART CATH AND CORONARY ANGIOGRAPHY;  Surgeon: Larey Dresser, MD;  Location: [facility];  Service: Cardiovascular;  Laterality: N/A;   SMALL INTESTINE SURGERY     x 2     Allergies  Allergen Reactions   Azathioprine Other (See Comments)    REACTION: affected WBC "Almost died"   Ciprofloxacin Other (See Comments)    Unknown rxn   Levaquin [Levofloxacin In D5w] Other (See Comments)    Unknown rxn   Plendil [Felodipine] Other (See Comments)    Unknown rxn    Outpatient Encounter Medications as of 09/06/2019  Medication Sig   allopurinol (ZYLOPRIM) 100 MG tablet Take 2 tablets (200 mg total) by mouth daily.   aspirin EC 81 MG tablet Take 1 tablet (81 mg total) by mouth daily.   Cholecalciferol (VITAMIN D) 125 MCG (5000 UT) CAPS Take 5,000 Units by mouth daily.    dapagliflozin propanediol (FARXIGA) 10 MG TABS tablet Take 1 tablet (10 mg total) by mouth daily before breakfast.   divalproex (DEPAKOTE ER) 500 MG 24 hr tablet Take 1,500 mg by mouth at bedtime.   epoetin alfa (PROCRIT) 58527 UNIT/ML injection Inject 10,000 Units into the skin every 30 (thirty) days.    ferrous sulfate 325 (65 FE) MG tablet Take 325 mg by mouth every Monday, Wednesday, and Friday.    hydrALAZINE (APRESOLINE) 25 MG tablet Take 1 tablet (25 mg total) by mouth 2 (two) times daily.   inFLIXimab (REMICADE) 100 MG injection Infuse Remicade IV schedule 1 26m/kg every 8 weeks Premedicate with Tylenol 500-6520mby mouth and Benadryl 25-5016my mouth prior to infusion. Last PPD was on 12/2009.    isosorbide mononitrate (IMDUR) 30 MG 24 hr tablet Take 1 tablet (30 mg total) by mouth daily.   ivabradine (CORLANOR) 5 MG TABS tablet Take 1 tablet (5 mg total) by mouth 2 (two) times daily with a meal.   loperamide (IMODIUM) 2 MG capsule Take 1 capsule (2 mg total) by mouth 4 (four) times daily as needed for diarrhea or loose stools.   magnesium oxide (MAG-OX) 400 MG tablet Take 800 mg by mouth in the morning, at noon, and at bedtime.    metoprolol succinate (TOPROL-XL) 25 MG 24 hr tablet Take 1 tablet (25 mg total) by mouth at bedtime.   OLANZapine (ZYPREXA) 7.5 MG  tablet Take 1 tablet (7.5 mg total) by mouth at bedtime.   omeprazole (PRILOSEC) 20 MG capsule Take 20 mg by mouth daily before breakfast.    tamsulosin (FLOMAX) 0.4 MG CAPS capsule Take 1 capsule (0.4 mg total) by mouth daily.   triamcinolone (NASACORT ALLERGY 24HR) 55 MCG/ACT AERO nasal inhaler Place 2 sprays into the nose daily as needed (allergies).   vitamin B-12 (CYANOCOBALAMIN) 1000 MCG tablet Take 1,000 mcg by mouth daily.   No facility-administered encounter medications on file as of 09/06/2019.    Review of Systems  Constitutional: Positive for fatigue. Negative for appetite change, chills and fever.  HENT: Negative for congestion, rhinorrhea, sinus pressure, sinus pain, sneezing, sore throat and trouble swallowing.   Eyes: Positive for discharge and visual disturbance. Negative for pain, redness and itching.       Wears eye glasses   Respiratory: Negative for cough, chest tightness and wheezing.        Chronic shortness of breath with exertion   Cardiovascular: Negative for chest pain, palpitations and leg swelling.  Gastrointestinal: Negative for abdominal distention, abdominal pain, constipation, diarrhea, nausea and vomiting.  Genitourinary: Negative for difficulty urinating, dysuria, frequency and urgency.  Musculoskeletal: Negative for arthralgias, gait problem, joint swelling and myalgias.  Skin: Negative for color change, pallor and rash.  Neurological: Negative for dizziness, speech difficulty, weakness, light-headedness, numbness and headaches.  Psychiatric/Behavioral: Negative for agitation, confusion and sleep disturbance. The patient is not nervous/anxious.     Immunization History  Administered Date(s) Administered   Fluad Quad(high Dose 65+) 11/15/2018   Influenza, High Dose Seasonal PF 11/26/2016   Influenza,inj,Quad PF,6+ Mos 11/13/2014, 01/03/2016   Influenza-Unspecified 10/25/2012, 11/24/2016, 11/24/2017   Moderna SARS-COVID-2 Vaccination  04/20/2019, 05/18/2019   PPD Test 12/30/2010, 01/05/2012, 01/07/2013, 01/13/2014   Pneumococcal Conjugate-13 08/10/2014   Pneumococcal Polysaccharide-23 03/05/2004, 01/03/2016, 05/06/2018   Tdap 05/26/2011   Pertinent  Health Maintenance Due  Topic Date Due   FOOT EXAM  Never done   OPHTHALMOLOGY EXAM  Never done   INFLUENZA VACCINE  09/25/2019   HEMOGLOBIN A1C  02/27/2020   COLONOSCOPY  09/17/2027   PNA vac Low Risk Adult  Completed   Fall Risk  09/06/2019 06/20/2019 06/09/2019 05/06/2019 03/25/2019  Falls in the past year? 0 0 0 0 0  Number falls in past yr: 0 1 0 0 0  Injury with Fall? 0 0 0 0 0  Risk for fall due to : - - - - -  Risk for fall due to: Comment - - - - -  Follow up - - - - -    Vitals:   09/06/19 1456  BP: 116/74  Pulse: 74  Resp: 16  Temp: (!) 97.5 F (36.4 C)  SpO2: 95%  Weight: 181 lb (82.1 kg)  Height: 6' 4" (1.93 m)   Body mass index is 22.03 kg/m. Physical Exam Vitals reviewed.  Constitutional:      General: He is not in acute distress.    Appearance: He is normal weight. He is not ill-appearing.  HENT:     Head: Normocephalic.     Right Ear: Tympanic membrane, ear canal and external ear normal. There is no impacted cerumen.     Left Ear: Tympanic membrane, ear canal and external ear normal. There is no impacted cerumen.     Nose: Nose normal. No congestion or rhinorrhea.     Mouth/Throat:     Mouth: Mucous membranes are moist.     Pharynx: Oropharynx is clear. No oropharyngeal exudate or posterior oropharyngeal erythema.  Eyes:     General: No scleral icterus.       Right eye: No discharge.        Left eye: Discharge present.    Extraocular Movements: Extraocular movements intact.     Conjunctiva/sclera: Conjunctivae normal.     Pupils: Pupils are equal, round, and reactive to light.     Comments: Yellow drainage on left eye noted on eyelashes.  Neck:     Vascular: No carotid bruit.  Cardiovascular:     Rate and Rhythm: Normal  rate and regular rhythm.     Pulses: Normal pulses.     Heart sounds: Normal heart sounds. No murmur heard.  No friction rub. No gallop.   Pulmonary:     Effort: Pulmonary effort is normal. No respiratory distress.     Breath sounds: Normal breath sounds. No wheezing, rhonchi or rales.  Chest:     Chest wall: No tenderness.  Abdominal:     General: Bowel sounds are normal. There is no  distension.     Palpations: Abdomen is soft. There is no mass.     Tenderness: There is no abdominal tenderness. There is no right CVA tenderness, left CVA tenderness, guarding or rebound.  Musculoskeletal:        General: No swelling or tenderness.     Cervical back: Normal range of motion. No tenderness.     Comments: Unsteady gait.bilateral lower extremities trace edema.   Lymphadenopathy:     Cervical: No cervical adenopathy.  Skin:    General: Skin is warm and dry.     Coloration: Skin is not pale.     Findings: No bruising or erythema.  Neurological:     Mental Status: He is alert and oriented to person, place, and time.     Cranial Nerves: No cranial nerve deficit.     Sensory: No sensory deficit.     Motor: No weakness.     Coordination: Coordination normal.  Psychiatric:        Mood and Affect: Mood normal.        Behavior: Behavior normal.        Thought Content: Thought content normal.        Judgment: Judgment normal.     Labs reviewed: Recent Labs    09/08/18 0248 09/09/18 0443 08/26/19 2043 08/27/19 0201 08/28/19 0323  NA 139   < > 141 140 138  K 3.9   < > 5.7* 5.5* 5.3*  CL 113*   < > 108 107 103  CO2 16*   < > _0 GLUCOSE 95   < > 57* 127* 97  BUN 57*   < > 36* 36* 34*  CREATININE 2.44*   < > 2.61* 2.52* 2.57*  CALCIUM 9.0   < > 8.5* 8.4* 8.6*  MG 1.6*  --   --  1.8 1.6*  PHOS  --   --   --  4.4 3.9   < > = values in this interval not displayed.   Recent Labs    09/09/18 0443 08/27/19 0201 08/28/19 0323  AST 12* 11* 11*  ALT _1 ALKPHOS 50 56 57   BILITOT 0.4 0.4 0.4  PROT 6.3* 5.6* 6.2*  ALBUMIN 2.7* 2.8* 3.0*   Recent Labs    12/30/18 0000 01/07/19 0955 04/26/19 0524 04/26/19 0803 08/26/19 1709 08/27/19 0201 08/28/19 0323  WBC 9.0   < > 7.8   < > 9.3 8.3 9.0  NEUTROABS 6  --  3.7  --   --   --  3.6  HGB 10.5*   < > 10.2*   < > 11.2* 9.7* 9.8*  HCT 31*   < > 32.3*   < > 36.5* 31.5* 32.3*  MCV  --    < > 104.2*   < > 108.0* 107.1* 108.4*  PLT 169   < > 141*   < > 183 150 152   < > = values in this interval not displayed.   Lab Results  Component Value Date   TSH 0.744 09/08/2018   Lab Results  Component Value Date   HGBA1C 5.1 08/27/2019   Lab Results  Component Value Date   CHOL 140 04/19/2019   HDL 54 04/19/2019   LDLCALC 64 04/19/2019   TRIG 110 04/19/2019   CHOLHDL 2.6 04/19/2019    Significant Diagnostic Results in last 30 days:  No results found.  Assessment/Plan 1. Chronic combined systolic and diastolic congestive heart failure (HCC) No  signs of fluid overload.off lasix.continue on on metoprolol,hydralazine ivabradine and imdur. - Advised to continue to monitor weight at home and notify provider for abrupt weight gain. - continue to follow up with cardiologist.   2. Essential hypertension, benign B/p at goal.continue current medication.  - CBC with Differential/Platelet - CMP with eGFR([facility])  3. Hyperkalemia Status post hospital admission due to hyperkalemia  K+ 5.5 (08/27/2019): 5.3 (06/28/2019) suspected due to dietary intake high in potassium with recent discontinuation of diuretic. - continue on renal diet  - will recheck K+  - CMP with eGFR([facility])  4. Hypomagnesemia Mg 1.6 97/05/2019) will recheck level today.  - Magnesium  5. Acute conjunctivitis of left eye, unspecified acute conjunctivitis type Matted eyelashes reported.yellow drainage noted during visit. - advised to cleanse eyelashes with warm water with antibacterial soap prior to applying eye drops.  - gentamicin (GARAMYCIN)  0.3 % ophthalmic solution; Place 1 drop into the left eye 3 (three) times daily for 7 days.  Dispense: 15 mL; Refill: 0 - Notify provider if symptoms worsen or fail to resolve.   Family/ staff Communication: Reviewed plan of care with patient verbalized understanding.   Labs/tests ordered:  - CBC with Differential/Platelet - CMP with eGFR([facility])  Next Appointment: Has appointment 10/20/2019 with Dr.Reed   Sandrea Hughs, NP

## 2019-09-06 NOTE — Patient Instructions (Signed)
-   Wash left eye lid with warm water with a wash cloth three times prior to applying eye drops.Notify provider if symptoms worsen or fail to resolve.

## 2019-09-07 LAB — CBC WITH DIFFERENTIAL/PLATELET
Absolute Monocytes: 729 cells/uL (ref 200–950)
Basophils Absolute: 36 cells/uL (ref 0–200)
Basophils Relative: 0.4 %
Eosinophils Absolute: 261 cells/uL (ref 15–500)
Eosinophils Relative: 2.9 %
HCT: 31.2 % — ABNORMAL LOW (ref 38.5–50.0)
Hemoglobin: 10.3 g/dL — ABNORMAL LOW (ref 13.2–17.1)
Lymphs Abs: 3159 cells/uL (ref 850–3900)
MCH: 33.4 pg — ABNORMAL HIGH (ref 27.0–33.0)
MCHC: 33 g/dL (ref 32.0–36.0)
MCV: 101.3 fL — ABNORMAL HIGH (ref 80.0–100.0)
MPV: 11.6 fL (ref 7.5–12.5)
Monocytes Relative: 8.1 %
Neutro Abs: 4815 cells/uL (ref 1500–7800)
Neutrophils Relative %: 53.5 %
Platelets: 160 10*3/uL (ref 140–400)
RBC: 3.08 10*6/uL — ABNORMAL LOW (ref 4.20–5.80)
RDW: 12.7 % (ref 11.0–15.0)
Total Lymphocyte: 35.1 %
WBC: 9 10*3/uL (ref 3.8–10.8)

## 2019-09-07 LAB — COMPLETE METABOLIC PANEL WITH GFR
AG Ratio: 1.2 (calc) (ref 1.0–2.5)
ALT: 7 U/L — ABNORMAL LOW (ref 9–46)
AST: 11 U/L (ref 10–35)
Albumin: 3.5 g/dL — ABNORMAL LOW (ref 3.6–5.1)
Alkaline phosphatase (APISO): 66 U/L (ref 35–144)
BUN/Creatinine Ratio: 14 (calc) (ref 6–22)
BUN: 31 mg/dL — ABNORMAL HIGH (ref 7–25)
CO2: 22 mmol/L (ref 20–32)
Calcium: 8.8 mg/dL (ref 8.6–10.3)
Chloride: 111 mmol/L — ABNORMAL HIGH (ref 98–110)
Creat: 2.16 mg/dL — ABNORMAL HIGH (ref 0.70–1.18)
GFR, Est African American: 34 mL/min/{1.73_m2} — ABNORMAL LOW (ref >=60)
GFR, Est Non African American: 29 mL/min/{1.73_m2} — ABNORMAL LOW (ref >=60)
Globulin: 2.9 g/dL (calc) (ref 1.9–3.7)
Glucose, Bld: 83 mg/dL (ref 65–99)
Potassium: 5.3 mmol/L (ref 3.5–5.3)
Sodium: 141 mmol/L (ref 135–146)
Total Bilirubin: 0.4 mg/dL (ref 0.2–1.2)
Total Protein: 6.4 g/dL (ref 6.1–8.1)

## 2019-09-07 LAB — MAGNESIUM: Magnesium: 1.8 mg/dL (ref 1.5–2.5)

## 2019-09-08 ENCOUNTER — Other Ambulatory Visit: Payer: Self-pay

## 2019-09-08 DIAGNOSIS — E875 Hyperkalemia: Secondary | ICD-10-CM

## 2019-09-08 DIAGNOSIS — I10 Essential (primary) hypertension: Secondary | ICD-10-CM

## 2019-09-08 NOTE — Progress Notes (Signed)
Please attach correct diagnosis to patient BMP recheck. Routed to [employer], NP.

## 2019-09-12 NOTE — Telephone Encounter (Signed)
Paramedicine is going to take [name] his application to sign. Provided the free card information to [facility] with paramedicine since he has not previously used it.  Will follow up.

## 2019-09-14 ENCOUNTER — Other Ambulatory Visit (HOSPITAL_COMMUNITY): Payer: Self-pay

## 2019-09-14 NOTE — Progress Notes (Signed)
Paramedicine Encounter    Patient ID: Brent [name], male    DOB: [birth year], 74 y.o.   MRN: [id number]   Patient Care Team: Gayland Curry, DO as PCP - General (Geriatric Medicine) Martinique, Peter M, MD as PCP - Cardiology (Cardiology) Larey Dresser, MD as PCP - Advanced Heart Failure (Cardiology) Elmarie Shiley, MD (Nephrology) Milus Banister, MD (Gastroenterology)  Patient Active Problem List   Diagnosis Date Noted  . Hyperkalemia 08/26/2019  . Chronic systolic CHF (congestive heart failure) ([place]) 08/26/2019  . Type 2 diabetes mellitus with stage 3 chronic kidney disease ([place]) 08/26/2019  . Chronic obstructive pulmonary disease ([place]) 06/20/2019  . Ventricular tachycardia ([place]) 06/20/2019  . Acute on chronic systolic (congestive) heart failure ([place]) 04/25/2019  . Metabolic acidosis 14/11/3011  . Orthostasis 09/07/2018  . AKI (acute kidney injury) ([place])   . Immunosuppressed status ([place])   . Macrocytic anemia   . Chronic combined systolic and diastolic congestive heart failure ([place]) 06/03/2018  . Candida esophagitis ([place]) 09/22/2017  . History of smoking 30 or more pack years 01/05/2017  . Bipolar 1 disorder ([place])   . BPH (benign prostatic hyperplasia) 03/31/2013  . 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 Essential hypertension, benign 03/31/2013  . Acute renal failure superimposed on stage 3 chronic kidney disease ([place]) 06/04/2012  . 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  allopurinol (ZYLOPRIM) 100 MG tablet, Take 2 tablets (200 mg total) by mouth daily., Disp: 60 tablet, Rfl: 5 .  aspirin EC 81 MG tablet, Take 1 tablet (81 mg total) by mouth daily., Disp: 90 tablet, Rfl: 3 .  Cholecalciferol (VITAMIN D) 125 MCG (5000 UT) CAPS, Take 5,000 Units by mouth daily. , Disp: , Rfl:  .  dapagliflozin propanediol (FARXIGA) 10 MG TABS tablet, Take 1 tablet (10 mg total) by mouth daily before breakfast., Disp: 30 tablet, Rfl: 6 .   divalproex (DEPAKOTE ER) 500 MG 24 hr tablet, Take 1,500 mg by mouth at bedtime., Disp: , Rfl:  .  epoetin alfa (PROCRIT) 14388 UNIT/ML injection, Inject 10,000 Units into the skin every 30 (thirty) days. , Disp: , Rfl:  .  ferrous sulfate 325 (65 FE) MG tablet, Take 325 mg by mouth every Monday, Wednesday, and Friday. , Disp: , Rfl:  .  hydrALAZINE (APRESOLINE) 25 MG tablet, Take 1 tablet (25 mg total) by mouth 2 (two) times daily., Disp: 60 tablet, Rfl: 3 .  inFLIXimab (REMICADE) 100 MG injection, Infuse Remicade IV schedule 1 2m/kg every 8 weeks Premedicate with Tylenol 500-654mby mouth and Benadryl 25-5039my mouth prior to infusion. Last PPD was on 12/2009. , Disp: 1 each, Rfl: 6 .  isosorbide mononitrate (IMDUR) 30 MG 24 hr tablet, Take 1 tablet (30 mg total) by mouth daily., Disp: 90 tablet, Rfl: 3 .  ivabradine (CORLANOR) 5 MG TABS tablet, Take 1 tablet (5 mg total) by mouth 2 (two) times daily with a meal., Disp: 180 tablet, Rfl: 3 .  loperamide (IMODIUM) 2 MG capsule, Take 1 capsule (2 mg total) by mouth 4 (four) times daily as needed for diarrhea or loose stools., Disp: 12 capsule, Rfl: 0 .  magnesium oxide (MAG-OX) 400 MG tablet, Take 800 mg by mouth in the morning, at noon, and at bedtime. , Disp: , Rfl:  .  metoprolol succinate (TOPROL-XL) 25 MG 24 hr tablet, Take 1 tablet (25 mg total) by mouth at bedtime., Disp: 90 tablet, Rfl: 3 .  OLANZapine (ZYPREXA) 7.5 MG tablet, Take 1 tablet (7.5 mg total) by mouth at bedtime., Disp: 30 tablet, Rfl: 11 .  omeprazole (PRILOSEC) 20 MG capsule, Take 20 mg by mouth daily before breakfast. , Disp: , Rfl:  .  tamsulosin (FLOMAX) 0.4 MG CAPS capsule, Take 1 capsule (0.4 mg total) by mouth daily., Disp: 30 capsule, Rfl: 2 .  triamcinolone (NASACORT ALLERGY 24HR) 55 MCG/ACT AERO nasal inhaler, Place 2 sprays into the nose daily as needed (allergies)., Disp: , Rfl:  .  vitamin B-12 (CYANOCOBALAMIN) 1000 MCG tablet, Take 1,000 mcg by mouth daily., Disp:  , Rfl:  Allergies  Allergen Reactions  . Azathioprine Other (See Comments)    REACTION: affected WBC "Almost died"  . Ciprofloxacin Other (See Comments)    Unknown rxn  . Levaquin [Levofloxacin In D5w] Other (See Comments)    Unknown rxn  . Plendil [Felodipine] Other (See Comments)    Unknown rxn     Social History   Socioeconomic History  . Marital status: Divorced    Spouse name: Not on file  . Number of children: 1  . Years of education: 71  . Highest education level: Not on file  Occupational History  . Occupation: retired  . Occupation: Veteran  Tobacco Use  . Smoking status: Former Smoker    Packs/day: 1.00    Years: 49.00    Pack years: 49.00    Types: Cigarettes    Start date: 04/11/1956    Quit date: 04/17/2014    Years since quitting: 5.4  . Smokeless tobacco: Never Used  Vaping Use  . Vaping Use: Never used  Substance and Sexual Activity  . Alcohol use: No    Alcohol/week: 0.0 standard drinks  . Drug use: No  . Sexual activity: Never  Other Topics Concern  . Not on file  Social History Narrative  . Not on file   Social Determinants of Health   Financial Resource Strain:   . Difficulty of Paying Living Expenses:   Food Insecurity:   . Worried About Charity fundraiser in the Last Year:   . Arboriculturist in the Last Year:   Transportation Needs:   . Film/video editor (Medical):   Marland Kitchen Lack of Transportation (Non-Medical):   Physical Activity:   . Days of Exercise per Week:   . Minutes of Exercise per Session:   Stress:   . Feeling of Stress :   Social Connections:   . Frequency of Communication with Friends and Family:   . Frequency of Social Gatherings with Friends and Family:   . Attends Religious Services:   . Active Member of Clubs or Organizations:   . Attends Archivist Meetings:   Marland Kitchen Marital Status:   Intimate Partner Violence:   . Fear of Current or Ex-Partner:   . Emotionally Abused:   Marland Kitchen Physically Abused:   .  Sexually Abused:     Physical Exam Vitals reviewed.  HENT:     Head: Normocephalic.     Nose: Nose normal.     Mouth/Throat:     Mouth: Mucous membranes are moist.     Pharynx: Oropharynx is clear.  Eyes:     Pupils: Pupils are equal, round, and reactive to light.  Cardiovascular:     Rate and Rhythm: Normal rate and regular rhythm.     Pulses: Normal pulses.     Heart sounds: Normal heart sounds.  Pulmonary:     Effort: Pulmonary effort  is normal.     Breath sounds: Normal breath sounds.  Abdominal:     General: Abdomen is flat.     Palpations: Abdomen is soft.  Musculoskeletal:        General: Swelling present. Normal range of motion.     Cervical back: Normal range of motion.     Right lower leg: No edema.     Left lower leg: No edema.  Skin:    General: Skin is warm and dry.     Capillary Refill: Capillary refill takes less than 2 seconds.  Neurological:     Mental Status: He is alert. Mental status is at baseline.  Psychiatric:        Mood and Affect: Mood normal.     Arrived for home visit for Chelsey who was alert and oriented resting in his chair. Granville reports he ate some chicken pot pies and noticed his weight increased a few pounds and his feet were slightly swollen. Aithan denied shortness of breath walking short distances, chest pain or dizziness. Develle was informed to decrease salt intake as he is very sensitive to same. Nicklos understood. Deacon was compliant with medication for the last two weeks. I reviewed medications and confirmed same filling one pill box. Vitals were obtained. Trenell agreed to visit in one week. He was advised if symptoms worsened to call me, clinic or EMS if needed. Home visit complete.   Refills: [place] paperwork filled out and turned into HF clinic. 30 day free card given to patient for [place].     Future Appointments  Date Time Provider [place]  09/16/2019 10:00 AM [facility] RM 2 [facility] None  09/22/2019  1:30 PM  [facility] None  10/20/2019 11:00 AM Hollace Kinnier L, DO [facility] None  11/14/2019  1:30 PM [facility] PA/NP [facility] None  01/24/2020  2:15 PM [name], [name], NP [facility] None     ACTION: Home visit completed Next visit planned for one week

## 2019-09-15 DIAGNOSIS — N1832 Chronic kidney disease, stage 3b: Secondary | ICD-10-CM | POA: Diagnosis not present

## 2019-09-15 DIAGNOSIS — E875 Hyperkalemia: Secondary | ICD-10-CM | POA: Diagnosis not present

## 2019-09-15 DIAGNOSIS — N2581 Secondary hyperparathyroidism of renal origin: Secondary | ICD-10-CM | POA: Diagnosis not present

## 2019-09-15 DIAGNOSIS — D631 Anemia in chronic kidney disease: Secondary | ICD-10-CM | POA: Diagnosis not present

## 2019-09-15 DIAGNOSIS — I129 Hypertensive chronic kidney disease with stage 1 through stage 4 chronic kidney disease, or unspecified chronic kidney disease: Secondary | ICD-10-CM | POA: Diagnosis not present

## 2019-09-16 ENCOUNTER — Ambulatory Visit (HOSPITAL_COMMUNITY)
Admission: RE | Admit: 2019-09-16 | Discharge: 2019-09-16 | Disposition: A | Discharge status: Home / Self Care | Payer: Medicare Other | Source: Ambulatory Visit | Attending: Nephrology | Admitting: Nephrology

## 2019-09-16 ENCOUNTER — Other Ambulatory Visit: Payer: Self-pay

## 2019-09-16 DIAGNOSIS — D631 Anemia in chronic kidney disease: Secondary | ICD-10-CM | POA: Diagnosis not present

## 2019-09-16 DIAGNOSIS — N183 Chronic kidney disease, stage 3 unspecified: Secondary | ICD-10-CM | POA: Diagnosis not present

## 2019-09-16 LAB — HEMOGLOBIN AND HEMATOCRIT, BLOOD
HCT: 34.5 % — ABNORMAL LOW (ref 39.0–52.0)
Hemoglobin: 10.7 g/dL — ABNORMAL LOW (ref 13.0–17.0)

## 2019-09-16 LAB — IRON AND TIBC
Iron: 44 ug/dL — ABNORMAL LOW (ref 45–182)
Saturation Ratios: 17 % — ABNORMAL LOW (ref 17.9–39.5)
TIBC: 264 ug/dL (ref 250–450)
UIBC: 220 ug/dL

## 2019-09-16 LAB — FERRITIN: Ferritin: 759 ng/mL — ABNORMAL HIGH (ref 24–336)

## 2019-09-16 MED ORDER — EPOETIN ALFA 10000 UNIT/ML IJ SOLN
10000.0000 [IU] | INTRAMUSCULAR | Status: DC
  Administered 2019-09-16: 10000 [IU] via SUBCUTANEOUS
  Filled 2019-09-16: qty 1

## 2019-09-16 NOTE — Telephone Encounter (Signed)
Sent in application via fax.  Will follow up.  

## 2019-09-16 NOTE — Discharge Instructions (Signed)

## 2019-09-16 NOTE — Progress Notes (Signed)
PATIENT CARE CENTER NOTE   Provider: Elmarie Shiley MD   Procedure: Epoetin (procrit) injection   Note: Patient received 10,000 units sub-q Epoetin alfa (Epogen) injection in the left arm. Labs - Iron, Ferritin and H&H were drawn pre injection and hemoglobin was 10.7. Patient's BP was 117/60. Discharge instructions given, patient alert, oriented and ambulatory at discharge.

## 2019-09-19 NOTE — Addendum Note (Signed)
Addended byMarlowe Sax C on: 09/19/2019 11:05 AM   Modules accepted: Level of Service

## 2019-09-21 ENCOUNTER — Other Ambulatory Visit (HOSPITAL_COMMUNITY): Payer: Self-pay

## 2019-09-21 NOTE — Progress Notes (Signed)
Paramedicine Encounter    Patient ID: Brent [name], male    DOB: [birth year], 74 y.o.   MRN: [id number]   Patient Care Team: Brent Curry, DO as PCP - General (Geriatric Medicine) Brent [name], Brent M, MD as PCP - Cardiology (Cardiology) Brent Dresser, MD as PCP - Advanced Heart Failure (Cardiology) Brent Shiley, MD (Nephrology) Brent Banister, MD (Gastroenterology)  Patient Active Problem List   Diagnosis Date Noted  . Hyperkalemia 08/26/2019  . Chronic systolic CHF (congestive heart failure) ([place]) 08/26/2019  . Type 2 diabetes mellitus with stage 3 chronic kidney disease ([place]) 08/26/2019  . Chronic obstructive pulmonary disease ([place]) 06/20/2019  . Ventricular tachycardia ([place]) 06/20/2019  . Acute on chronic systolic (congestive) heart failure ([place]) 04/25/2019  . Metabolic acidosis 00/34/9179  . Orthostasis 09/07/2018  . AKI (acute kidney injury) ([place])   . Immunosuppressed status ([place])   . Macrocytic anemia   . Chronic combined systolic and diastolic congestive heart failure ([place]) 06/03/2018  . Candida esophagitis ([place]) 09/22/2017  . History of smoking 30 or more pack years 01/05/2017  . Bipolar 1 disorder ([place])   . BPH (benign prostatic hyperplasia) 03/31/2013  . 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 Essential hypertension, benign 03/31/2013  . Acute renal failure superimposed on stage 3 chronic kidney disease ([place]) 06/04/2012  . 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[place]) 01/23/2010    Current Outpatient Medications:  .  allopurinol (ZYLOPRIM) 100 MG tablet, Take 2 tablets (200 mg total) by mouth daily., Disp: 60 tablet, Rfl: 5 .  aspirin EC 81 MG tablet, Take 1 tablet (81 mg total) by mouth daily., Disp: 90 tablet, Rfl: 3 .  Cholecalciferol (VITAMIN D) 125 MCG (5000 UT) CAPS, Take 5,000 Units by mouth daily. , Disp: , Rfl:  .  dapagliflozin propanediol (FARXIGA) 10 MG TABS tablet, Take 1 tablet (10 mg total) by mouth daily before breakfast., Disp: 30 tablet, Rfl: 6 .   divalproex (DEPAKOTE ER) 500 MG 24 hr tablet, Take 1,500 mg by mouth at bedtime., Disp: , Rfl:  .  epoetin alfa (PROCRIT) 15056 UNIT/ML injection, Inject 10,000 Units into the skin every 30 (thirty) days. , Disp: , Rfl:  .  ferrous sulfate 325 (65 FE) MG tablet, Take 325 mg by mouth every Monday, Wednesday, and Friday. , Disp: , Rfl:  .  hydrALAZINE (APRESOLINE) 25 MG tablet, Take 1 tablet (25 mg total) by mouth 2 (two) times daily., Disp: 60 tablet, Rfl: 3 .  inFLIXimab (REMICADE) 100 MG injection, Infuse Remicade IV schedule 1 60m/kg every 8 weeks Premedicate with Tylenol 500-6588mby mouth and Benadryl 25-5045my mouth prior to infusion. Last PPD was on 12/2009. , Disp: 1 each, Rfl: 6 .  isosorbide mononitrate (IMDUR) 30 MG 24 hr tablet, Take 1 tablet (30 mg total) by mouth daily., Disp: 90 tablet, Rfl: 3 .  ivabradine (CORLANOR) 5 MG TABS tablet, Take 1 tablet (5 mg total) by mouth 2 (two) times daily with a meal., Disp: 180 tablet, Rfl: 3 .  loperamide (IMODIUM) 2 MG capsule, Take 1 capsule (2 mg total) by mouth 4 (four) times daily as needed for diarrhea or loose stools., Disp: 12 capsule, Rfl: 0 .  magnesium oxide (MAG-OX) 400 MG tablet, Take 800 mg by mouth in the morning, at noon, and at bedtime. , Disp: , Rfl:  .  metoprolol succinate (TOPROL-XL) 25 MG 24 hr tablet, Take 1 tablet (25 mg total) by mouth at bedtime., Disp: 90 tablet, Rfl: 3 .  OLANZapine (ZYPREXA) 7.5 MG tablet, Take 1 tablet (7.5 mg total) by mouth at bedtime., Disp: 30 tablet, Rfl: 11 .  omeprazole (PRILOSEC) 20 MG capsule, Take 20 mg by mouth daily before breakfast. , Disp: , Rfl:  .  tamsulosin (FLOMAX) 0.4 MG CAPS capsule, Take 1 capsule (0.4 mg total) by mouth daily., Disp: 30 capsule, Rfl: 2 .  triamcinolone (NASACORT ALLERGY 24HR) 55 MCG/ACT AERO nasal inhaler, Place 2 sprays into the nose daily as needed (allergies)., Disp: , Rfl:  .  vitamin B-12 (CYANOCOBALAMIN) 1000 MCG tablet, Take 1,000 mcg by mouth daily., Disp:  , Rfl:  Allergies  Allergen Reactions  . Azathioprine Other (See Comments)    REACTION: affected WBC "Almost died"  . Ciprofloxacin Other (See Comments)    Unknown rxn  . Levaquin [Levofloxacin In D5w] Other (See Comments)    Unknown rxn  . Plendil [Felodipine] Other (See Comments)    Unknown rxn     Social History   Socioeconomic History  . Marital status: Divorced    Spouse name: Not on file  . Number of children: 1  . Years of education: 31  . Highest education level: Not on file  Occupational History  . Occupation: retired  . Occupation: Veteran  Tobacco Use  . Smoking status: Former Smoker    Packs/day: 1.00    Years: 49.00    Pack years: 49.00    Types: Cigarettes    Start date: 04/11/1956    Quit date: 04/17/2014    Years since quitting: 5.4  . Smokeless tobacco: Never Used  Vaping Use  . Vaping Use: Never used  Substance and Sexual Activity  . Alcohol use: No    Alcohol/week: 0.0 standard drinks  . Drug use: No  . Sexual activity: Never  Other Topics Concern  . Not on file  Social History Narrative  . Not on file   Social Determinants of Health   Financial Resource Strain:   . Difficulty of Paying Living Expenses:   Food Insecurity:   . Worried About Charity fundraiser in the Last Year:   . Arboriculturist in the Last Year:   Transportation Needs:   . Film/video editor (Medical):   Marland Kitchen Lack of Transportation (Non-Medical):   Physical Activity:   . Days of Exercise per Week:   . Minutes of Exercise per Session:   Stress:   . Feeling of Stress :   Social Connections:   . Frequency of Communication with Friends and Family:   . Frequency of Social Gatherings with Friends and Family:   . Attends Religious Services:   . Active Member of Clubs or Organizations:   . Attends Archivist Meetings:   Marland Kitchen Marital Status:   Intimate Partner Violence:   . Fear of Current or Ex-Partner:   . Emotionally Abused:   Marland Kitchen Physically Abused:   .  Sexually Abused:     Physical Exam Vitals reviewed.  Constitutional:      Appearance: He is normal weight.  HENT:     Nose: Nose normal.     Mouth/Throat:     Mouth: Mucous membranes are moist.     Pharynx: Oropharynx is clear.  Eyes:     Pupils: Pupils are equal, round, and reactive to light.  Cardiovascular:     Rate and Rhythm: Normal rate and regular rhythm.     Pulses: Normal pulses.     Heart sounds: Normal heart sounds.  Pulmonary:  Effort: Pulmonary effort is normal.     Breath sounds: Normal breath sounds.  Abdominal:     General: Abdomen is flat.     Palpations: Abdomen is soft.  Musculoskeletal:        General: Normal range of motion.     Cervical back: Normal range of motion.     Right lower leg: No edema.     Left lower leg: No edema.  Skin:    General: Skin is warm and dry.     Capillary Refill: Capillary refill takes less than 2 seconds.  Neurological:     Mental Status: He is alert. Mental status is at baseline.  Psychiatric:        Mood and Affect: Mood normal.     Arrived for home visit for Abundio. Xayvier reports feeling good and denies any shortness of breath or dizziness, chest pain or trouble sleeping. Vitals were obtained. Tyri was compliant with medications for last week. Medications were verified and confirmed. Pill box filled accordingly. Jamarrius was reminded of upcoming appointments. Assessment as noted. Home visit complete.   Refills: NONE     Future Appointments  Date Time Provider [place]  09/22/2019  1:30 PM [facility] None  10/17/2019 10:30 AM [facility] None  10/20/2019 11:00 AM Mariea Clonts, Tiffany L, DO [facility] None  11/14/2019  1:30 PM [facility] PA/NP [facility] None  01/24/2020  2:15 PM [name], [name], NP [facility] None     ACTION: Home visit completed Next visit planned for one week

## 2019-09-22 ENCOUNTER — Other Ambulatory Visit: Payer: Self-pay

## 2019-09-26 ENCOUNTER — Other Ambulatory Visit: Payer: Medicare Other

## 2019-09-26 ENCOUNTER — Other Ambulatory Visit: Payer: Self-pay

## 2019-09-26 DIAGNOSIS — E875 Hyperkalemia: Secondary | ICD-10-CM

## 2019-09-26 DIAGNOSIS — I10 Essential (primary) hypertension: Secondary | ICD-10-CM

## 2019-09-27 ENCOUNTER — Other Ambulatory Visit (HOSPITAL_COMMUNITY): Payer: Self-pay

## 2019-09-27 LAB — BASIC METABOLIC PANEL
BUN/Creatinine Ratio: 15 (calc) (ref 6–22)
BUN: 38 mg/dL — ABNORMAL HIGH (ref 7–25)
CO2: 20 mmol/L (ref 20–32)
Calcium: 8.7 mg/dL (ref 8.6–10.3)
Chloride: 111 mmol/L — ABNORMAL HIGH (ref 98–110)
Creat: 2.49 mg/dL — ABNORMAL HIGH (ref 0.70–1.18)
Glucose, Bld: 104 mg/dL — ABNORMAL HIGH (ref 65–99)
Potassium: 5.3 mmol/L (ref 3.5–5.3)
Sodium: 139 mmol/L (ref 135–146)

## 2019-09-27 NOTE — Progress Notes (Signed)
Paramedicine Encounter    Patient ID: Brent [name], male    DOB: 11-01-1945, 74 y.o.   MRN: [id number]   Patient Care Team: Gayland Curry, DO as PCP - General (Geriatric Medicine) [place], Peter M, MD as PCP - Cardiology (Cardiology) Larey Dresser, MD as PCP - Advanced Heart Failure (Cardiology) Elmarie Shiley, MD (Nephrology) Milus Banister, MD (Gastroenterology)  Patient Active Problem List   Diagnosis Date Noted  . Hyperkalemia 08/26/2019  . Chronic systolic CHF (congestive heart failure) ([place]) 08/26/2019  . Type 2 diabetes mellitus with stage 3 chronic kidney disease ([place]) 08/26/2019  . Chronic obstructive pulmonary disease ([place]) 06/20/2019  . Ventricular tachycardia ([place]) 06/20/2019  . Acute on chronic systolic (congestive) heart failure ([place]) 04/25/2019  . Metabolic acidosis 15/17/6160  . Orthostasis 09/07/2018  . AKI (acute kidney injury) ([place])   . Immunosuppressed status ([place])   . Macrocytic anemia   . Chronic combined systolic and diastolic congestive heart failure ([place]) 06/03/2018  . Candida esophagitis ([place]) 09/22/2017  . History of smoking 30 or more pack years 01/05/2017  . Bipolar 1 disorder ([place])   . BPH (benign prostatic hyperplasia) 03/31/2013  . 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 Essential hypertension, benign 03/31/2013  . Acute renal failure superimposed on stage 3 chronic kidney disease ([place]) 06/04/2012  . 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[place]) 01/23/2010    Current Outpatient Medications:  .  allopurinol (ZYLOPRIM) 100 MG tablet, Take 2 tablets (200 mg total) by mouth daily., Disp: 60 tablet, Rfl: 5 .  aspirin EC 81 MG tablet, Take 1 tablet (81 mg total) by mouth daily., Disp: 90 tablet, Rfl: 3 .  Cholecalciferol (VITAMIN D) 125 MCG (5000 UT) CAPS, Take 5,000 Units by mouth daily. , Disp: , Rfl:  .  dapagliflozin propanediol (FARXIGA) 10 MG TABS tablet, Take 1 tablet (10 mg total) by mouth daily before breakfast., Disp: 30 tablet, Rfl: 6 .   divalproex (DEPAKOTE ER) 500 MG 24 hr tablet, Take 1,500 mg by mouth at bedtime., Disp: , Rfl:  .  epoetin alfa (PROCRIT) 73710 UNIT/ML injection, Inject 10,000 Units into the skin every 30 (thirty) days. , Disp: , Rfl:  .  ferrous sulfate 325 (65 FE) MG tablet, Take 325 mg by mouth every Monday, Wednesday, and Friday. , Disp: , Rfl:  .  gentamicin (GARAMYCIN) 0.3 % ophthalmic solution, Place 1 drop into the left eye 3 (three) times daily., Disp: , Rfl:  .  hydrALAZINE (APRESOLINE) 25 MG tablet, Take 1 tablet (25 mg total) by mouth 2 (two) times daily., Disp: 60 tablet, Rfl: 3 .  inFLIXimab (REMICADE) 100 MG injection, Infuse Remicade IV schedule 1 76m/kg every 8 weeks Premedicate with Tylenol 500-6582mby mouth and Benadryl 25-5080my mouth prior to infusion. Last PPD was on 12/2009. , Disp: 1 each, Rfl: 6 .  isosorbide mononitrate (IMDUR) 30 MG 24 hr tablet, Take 1 tablet (30 mg total) by mouth daily., Disp: 90 tablet, Rfl: 3 .  ivabradine (CORLANOR) 5 MG TABS tablet, Take 1 tablet (5 mg total) by mouth 2 (two) times daily with a meal., Disp: 180 tablet, Rfl: 3 .  loperamide (IMODIUM) 2 MG capsule, Take 1 capsule (2 mg total) by mouth 4 (four) times daily as needed for diarrhea or loose stools., Disp: 12 capsule, Rfl: 0 .  magnesium oxide (MAG-OX) 400 MG tablet, Take 800 mg by mouth in the morning, at noon, and at bedtime. , Disp: , Rfl:  .  metoprolol succinate (  TOPROL-XL) 25 MG 24 hr tablet, Take 1 tablet (25 mg total) by mouth at bedtime., Disp: 90 tablet, Rfl: 3 .  OLANZapine (ZYPREXA) 7.5 MG tablet, Take 1 tablet (7.5 mg total) by mouth at bedtime., Disp: 30 tablet, Rfl: 11 .  omeprazole (PRILOSEC) 20 MG capsule, Take 20 mg by mouth daily before breakfast. , Disp: , Rfl:  .  tamsulosin (FLOMAX) 0.4 MG CAPS capsule, Take 1 capsule (0.4 mg total) by mouth daily., Disp: 30 capsule, Rfl: 2 .  triamcinolone (NASACORT ALLERGY 24HR) 55 MCG/ACT AERO nasal inhaler, Place 2 sprays into the nose daily as  needed (allergies)., Disp: , Rfl:  .  vitamin B-12 (CYANOCOBALAMIN) 1000 MCG tablet, Take 1,000 mcg by mouth daily., Disp: , Rfl:  Allergies  Allergen Reactions  . Azathioprine Other (See Comments)    REACTION: affected WBC "Almost died"  . Ciprofloxacin Other (See Comments)    Unknown rxn  . Levaquin [Levofloxacin In D5w] Other (See Comments)    Unknown rxn  . Plendil [Felodipine] Other (See Comments)    Unknown rxn     Social History   Socioeconomic History  . Marital status: Divorced    Spouse name: Not on file  . Number of children: 1  . Years of education: 49  . Highest education level: Not on file  Occupational History  . Occupation: retired  . Occupation: Veteran  Tobacco Use  . Smoking status: Former Smoker    Packs/day: 1.00    Years: 49.00    Pack years: 49.00    Types: Cigarettes    Start date: 04/11/1956    Quit date: 04/17/2014    Years since quitting: 5.4  . Smokeless tobacco: Never Used  Vaping Use  . Vaping Use: Never used  Substance and Sexual Activity  . Alcohol use: No    Alcohol/week: 0.0 standard drinks  . Drug use: No  . Sexual activity: Never  Other Topics Concern  . Not on file  Social History Narrative  . Not on file   Social Determinants of Health   Financial Resource Strain:   . Difficulty of Paying Living Expenses:   Food Insecurity:   . Worried About Charity fundraiser in the Last Year:   . Arboriculturist in the Last Year:   Transportation Needs:   . Film/video editor (Medical):   Marland Kitchen Lack of Transportation (Non-Medical):   Physical Activity:   . Days of Exercise per Week:   . Minutes of Exercise per Session:   Stress:   . Feeling of Stress :   Social Connections:   . Frequency of Communication with Friends and Family:   . Frequency of Social Gatherings with Friends and Family:   . Attends Religious Services:   . Active Member of Clubs or Organizations:   . Attends Archivist Meetings:   Marland Kitchen Marital Status:    Intimate Partner Violence:   . Fear of Current or Ex-Partner:   . Emotionally Abused:   Marland Kitchen Physically Abused:   . Sexually Abused:     Physical Exam Vitals reviewed.  Constitutional:      Appearance: He is normal weight. He is ill-appearing.  HENT:     Head: Normocephalic.     Nose: Nose normal.     Mouth/Throat:     Mouth: Mucous membranes are moist.  Eyes:     Pupils: Pupils are equal, round, and reactive to light.  Cardiovascular:     Rate and Rhythm:  Normal rate and regular rhythm.     Pulses: Normal pulses.     Heart sounds: Normal heart sounds.  Pulmonary:     Effort: Pulmonary effort is normal. No respiratory distress.     Breath sounds: Normal breath sounds.  Abdominal:     General: Abdomen is flat.     Palpations: Abdomen is soft.  Genitourinary:    Comments: Dark, foul smelling urine.  Musculoskeletal:        General: Normal range of motion.     Cervical back: Normal range of motion.     Right lower leg: No edema.     Left lower leg: No edema.  Skin:    General: Skin is warm and dry.     Capillary Refill: Capillary refill takes less than 2 seconds.  Neurological:     Mental Status: He is alert. Mental status is at baseline.     Motor: Weakness present.     Comments: Drowsy   Psychiatric:        Mood and Affect: Mood normal.     Arrived for home visit for Jimmy who was seated in his recliner alert but fatigued. Laverna Peace reported he is tired and his urine has had a foul smell and is dark, Laverna Peace reports this has been going on for one week. He denied history of UTI's. Vitals revealed hypotension. I scheduled appointment with PCP for tomorrow at 915. Jimmy agreed to same and reports he will be there. He was made aware to call me if he needed me to come back out or make any adjustments to medications. He agreed. Medications verified and confirmed. Pill box filled accordingly. Jimmy agreed to hydrate appropriately and to make sure he makes appointment tomorrow. He  agreed with same. Home visit complete. I will follow up next week.  Refills: [place]     Future Appointments  Date Time Provider [place]  10/17/2019 10:30 AM [facility] None  10/20/2019 11:00 AM Hollace Kinnier L, DO [facility] None  11/14/2019  1:30 PM [facility] PA/NP [facility] None  01/24/2020  2:15 PM [name], [name], NP [facility] None     ACTION: Home visit completed Next visit planned for one week

## 2019-09-28 ENCOUNTER — Encounter: Payer: Self-pay | Admitting: Nurse Practitioner

## 2019-09-28 ENCOUNTER — Ambulatory Visit (INDEPENDENT_AMBULATORY_CARE_PROVIDER_SITE_OTHER): Payer: Medicare Other | Admitting: Nurse Practitioner

## 2019-09-28 ENCOUNTER — Other Ambulatory Visit: Payer: Self-pay

## 2019-09-28 VITALS — BP 116/72 | HR 79 | Temp 96.8°F | Ht 76.0 in | Wt 177.2 lb

## 2019-09-28 DIAGNOSIS — K5000 Crohn's disease of small intestine without complications: Secondary | ICD-10-CM | POA: Diagnosis not present

## 2019-09-28 DIAGNOSIS — R630 Anorexia: Secondary | ICD-10-CM

## 2019-09-28 DIAGNOSIS — R531 Weakness: Secondary | ICD-10-CM

## 2019-09-28 NOTE — Progress Notes (Signed)
Careteam: Patient Care Team: Gayland Curry, DO as PCP - General (Geriatric Medicine) Martinique, Peter M, MD as PCP - Cardiology (Cardiology) Larey Dresser, MD as PCP - Advanced Heart Failure (Cardiology) Elmarie Shiley, MD (Nephrology) Milus Banister, MD (Gastroenterology)  PLACE OF SERVICE:  [place]  Advanced Directive information    Allergies  Allergen Reactions  . Azathioprine Other (See Comments)    REACTION: affected WBC "Almost died"  . Ciprofloxacin Other (See Comments)    Unknown rxn  . Levaquin [Levofloxacin In D5w] Other (See Comments)    Unknown rxn  . Plendil [Felodipine] Other (See Comments)    Unknown rxn    Chief Complaint  Patient presents with  . Acute Visit    Overall not feeling well and urine with strong odor x 2-3 days     HPI: Patient is a 74 y.o. male here today with concerns of feeling tired and weak.  Reports he is not drinking enough fluids No pain with urination No fever No abdominal pain or pressure.  No back discomfort Reports progressive shortness of breath, no cough or congestion.  No chest pains or palpitations.  Reports he is not eating much, does not have that much of an appetite. No nausea, vomiting. Pt with chronic diarrhea due to crohns disease. No worsening of symptoms. Reports 2-3 stools a day that are loose.  Takes iodium scheduled. Has appt with GI this week at the [place]  Reports he drinks 1 glass of water a day and maybe 3 ginger-ales.  Only eating 1 meal a day.  Review of Systems:  Review of Systems  Constitutional: Negative for chills, fever and weight loss.  HENT: Negative for tinnitus.   Respiratory: Positive for shortness of breath (with exertion). Negative for cough and sputum production.   Cardiovascular: Negative for chest pain, palpitations and leg swelling.  Gastrointestinal: Positive for diarrhea. Negative for abdominal pain, constipation and heartburn.  Genitourinary: Negative for dysuria, frequency and  urgency.  Musculoskeletal: Negative for back pain, falls, joint pain and myalgias.  Skin: Negative.   Neurological: Positive for weakness. Negative for dizziness and headaches.    Past Medical History:  Diagnosis Date  . Anemia   . Anxiety   . Benign paroxysmal positional vertigo   . Bipolar I disorder, most recent episode (or current) unspecified   . Celiac disease   . CHF (congestive heart failure) ([place])   . Chronic kidney disease, stage III (moderate)   . Crohn's    Remicade q8 weeks  . Depression   . Essential and other specified forms of tremor    medication-induced Parkinson's, now resolved  . Essential hypertension, benign   . Gout 2018  . Heart failure ([place])   . Hypertrophy of prostate without urinary obstruction and other lower urinary tract symptoms (LUTS)   . Impotence of organic origin   . Insomnia with sleep apnea, unspecified   . Iron deficiency anemia, unspecified   . 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 Neuralgia, neuritis, and radiculitis, unspecified   . Other B-complex deficiencies   . Other extrapyramidal disease and abnormal movement disorder   . Postinflammatory pulmonary fibrosis ([place])   . Tobacco use disorder   . Vertigo 2018   Past Surgical History:  Procedure Laterality Date  . CATARACT EXTRACTION, BILATERAL Bilateral 09/2018  . CHOLECYSTECTOMY  07-12-2010  . RIGHT HEART CATH N/A 10/12/2018   Procedure: RIGHT HEART CATH;  Surgeon: Jolaine Artist, MD;  Location: [facility];  Service: Cardiovascular;  Laterality: N/A;  . RIGHT/LEFT HEART CATH AND CORONARY ANGIOGRAPHY N/A 04/26/2019   Procedure: RIGHT/LEFT HEART CATH AND CORONARY ANGIOGRAPHY;  Surgeon: Larey Dresser, MD;  Location: [facility];  Service: Cardiovascular;  Laterality: N/A;  . SMALL INTESTINE SURGERY     x 2   Social History:   reports that he quit smoking about 5 years ago. His smoking use included cigarettes. He started smoking about 63 years ago. He has a 49.00 pack-year  smoking history. He has never used smokeless tobacco. He reports that he does not drink alcohol and does not use drugs.  Family History  Problem Relation Age of Onset  . Diabetes Mother        maternal grandmother  . Uterine cancer Mother   . Emphysema Father   . Pneumonia Maternal Grandmother   . Colon cancer Neg Hx     Medications: Patient's Medications  New Prescriptions   No medications on file  Previous Medications   ALLOPURINOL (ZYLOPRIM) 100 MG TABLET    Take 2 tablets (200 mg total) by mouth daily.   ASPIRIN EC 81 MG TABLET    Take 1 tablet (81 mg total) by mouth daily.   CHOLECALCIFEROL (VITAMIN D) 125 MCG (5000 UT) CAPS    Take 5,000 Units by mouth daily.    DAPAGLIFLOZIN PROPANEDIOL (FARXIGA) 10 MG TABS TABLET    Take 1 tablet (10 mg total) by mouth daily before breakfast.   DIVALPROEX (DEPAKOTE ER) 500 MG 24 HR TABLET    Take 1,500 mg by mouth at bedtime.   EPOETIN ALFA (PROCRIT) 50354 UNIT/ML INJECTION    Inject 10,000 Units into the skin every 30 (thirty) days.    FERROUS SULFATE 325 (65 FE) MG TABLET    Take 325 mg by mouth every Monday, Wednesday, and Friday.    GENTAMICIN (GARAMYCIN) 0.3 % OPHTHALMIC SOLUTION    Place 1 drop into the left eye 3 (three) times daily.   HYDRALAZINE (APRESOLINE) 25 MG TABLET    Take 1 tablet (25 mg total) by mouth 2 (two) times daily.   INFLIXIMAB (REMICADE) 100 MG INJECTION    Infuse Remicade IV schedule 1 33m/kg every 8 weeks Premedicate with Tylenol 500-6556mby mouth and Benadryl 25-5040my mouth prior to infusion. Last PPD was on 12/2009.    ISOSORBIDE MONONITRATE (IMDUR) 30 MG 24 HR TABLET    Take 1 tablet (30 mg total) by mouth daily.   IVABRADINE (CORLANOR) 5 MG TABS TABLET    Take 1 tablet (5 mg total) by mouth 2 (two) times daily with a meal.   LOPERAMIDE (IMODIUM) 2 MG CAPSULE    Take 1 capsule (2 mg total) by mouth 4 (four) times daily as needed for diarrhea or loose stools.   MAGNESIUM OXIDE (MAG-OX) 400 MG TABLET    Take  800 mg by mouth in the morning, at noon, and at bedtime.    METOPROLOL SUCCINATE (TOPROL-XL) 25 MG 24 HR TABLET    Take 1 tablet (25 mg total) by mouth at bedtime.   OLANZAPINE (ZYPREXA) 7.5 MG TABLET    Take 1 tablet (7.5 mg total) by mouth at bedtime.   OMEPRAZOLE (PRILOSEC) 20 MG CAPSULE    Take 20 mg by mouth daily before breakfast.    TAMSULOSIN (FLOMAX) 0.4 MG CAPS CAPSULE    Take 1 capsule (0.4 mg total) by mouth daily.   TRIAMCINOLONE (NASACORT ALLERGY 24HR) 55 MCG/ACT AERO NASAL INHALER    Place  2 sprays into the nose daily as needed (allergies).   VITAMIN B-12 (CYANOCOBALAMIN) 1000 MCG TABLET    Take 1,000 mcg by mouth daily.  Modified Medications   No medications on file  Discontinued Medications   No medications on file    Physical Exam:  Vitals:   09/28/19 0914  BP: 116/72  Pulse: 79  Temp: (!) 96.8 F (36 C)  TempSrc: Temporal  SpO2: 94%  Weight: 177 lb 3.2 oz (80.4 kg)  Height: 6' 4"  (1.93 m)   Body mass index is 21.57 kg/m. Wt Readings from Last 3 Encounters:  09/28/19 177 lb 3.2 oz (80.4 kg)  09/21/19 177 lb 3.2 oz (80.4 kg)  09/14/19 181 lb 12.8 oz (82.5 kg)    Physical Exam Constitutional:      General: He is not in acute distress.    Appearance: He is well-developed. He is not diaphoretic.  HENT:     Head: Normocephalic and atraumatic.     Mouth/Throat:     Mouth: Mucous membranes are dry.     Pharynx: No oropharyngeal exudate.  Eyes:     Conjunctiva/sclera: Conjunctivae normal.     Pupils: Pupils are equal, round, and reactive to light.  Cardiovascular:     Rate and Rhythm: Normal rate and regular rhythm.     Heart sounds: Normal heart sounds.  Pulmonary:     Effort: Pulmonary effort is normal.     Breath sounds: Normal breath sounds.  Abdominal:     General: Bowel sounds are normal. There is no distension.     Palpations: Abdomen is soft.     Tenderness: There is no abdominal tenderness.  Musculoskeletal:        General: No tenderness.      Cervical back: Normal range of motion and neck supple.     Right lower leg: No edema.     Left lower leg: No edema.  Skin:    General: Skin is warm and dry.  Neurological:     Mental Status: He is alert and oriented to person, place, and time.     Labs reviewed: Basic Metabolic Panel: Recent Labs    08/27/19 0201 08/27/19 0201 08/28/19 0323 09/06/19 1559 09/26/19 1416  NA 140   < > 138 141 139  K 5.5*   < > 5.3* 5.3 5.3  CL 107   < > 103 111* 111*  CO2 25   < > 27 22 20   GLUCOSE 127*   < > 97 83 104*  BUN 36*   < > 34* 31* 38*  CREATININE 2.52*   < > 2.57* 2.16* 2.49*  CALCIUM 8.4*   < > 8.6* 8.8 8.7  MG 1.8  --  1.6* 1.8  --   PHOS 4.4  --  3.9  --   --    < > = values in this interval not displayed.   Liver Function Tests: Recent Labs    08/27/19 0201 08/28/19 0323 09/06/19 1559  AST 11* 11* 11  ALT 9 10 7*  ALKPHOS 56 57  --   BILITOT 0.4 0.4 0.4  PROT 5.6* 6.2* 6.4  ALBUMIN 2.8* 3.0*  --    No results for input(s): LIPASE, AMYLASE in the last 8760 hours. No results for input(s): AMMONIA in the last 8760 hours. CBC: Recent Labs    04/26/19 0524 04/26/19 0803 08/27/19 0201 08/27/19 0201 08/28/19 0323 09/06/19 1559 09/16/19 0959  WBC 7.8   < > 8.3  --  9.0 9.0  --   NEUTROABS 3.7  --   --   --  3.6 4,815  --   HGB 10.2*   < > 9.7*   < > 9.8* 10.3* 10.7*  HCT 32.3*   < > 31.5*   < > 32.3* 31.2* 34.5*  MCV 104.2*   < > 107.1*  --  108.4* 101.3*  --   PLT 141*   < > 150  --  152 160  --    < > = values in this interval not displayed.   Lipid Panel: Recent Labs    04/19/19 1041  CHOL 140  HDL 54  LDLCALC 64  TRIG 110  CHOLHDL 2.6   TSH: No results for input(s): TSH in the last 8760 hours. A1C: Lab Results  Component Value Date   HGBA1C 5.1 08/27/2019     Assessment/Plan 1. Weakness No signs of UTI based on symptoms, encouraged to increase hydration. He is very sedentary and has had episodes of diarrhea (chronic and following with GI)  poor oral intake and likely not getting enough protein with one meal a day. Encouraged to increase meal intake to 3 meals daily and hydration. Will get labs to rule out acute abnormalities.  - CBC with Differential/Platelet - COMPLETE METABOLIC PANEL WITH GFR - TSH  2. Decreased appetite -encouraged 3 meals a day with proper protein intake. To increase water intake as well.  - CBC with Differential/Platelet - COMPLETE METABOLIC PANEL WITH GFR - TSH  3. Crohn's disease of small intestine without complication ([place]) Followed at GI, following up this week due to his chronic diarrhea.   Next appt: 10/20/2019 as scheduled with Dr Sharee Holster K. Adeline, [facility] [phone]

## 2019-09-28 NOTE — Patient Instructions (Addendum)
To make sure you are eating 3 meals a day with proper protein intake  Increase water intake to drink 6 full glasses of water a day (8oz)  To follow up with GI on diarreha

## 2019-09-28 NOTE — Telephone Encounter (Signed)
Despite successful confirmation, I had to resend in the patient's application to [employer].  Will follow up.

## 2019-09-29 LAB — CBC WITH DIFFERENTIAL/PLATELET
Absolute Monocytes: 706 cells/uL (ref 200–950)
Basophils Absolute: 43 cells/uL (ref 0–200)
Basophils Relative: 0.4 %
Eosinophils Absolute: 257 cells/uL (ref 15–500)
Eosinophils Relative: 2.4 %
HCT: 34.3 % — ABNORMAL LOW (ref 38.5–50.0)
Hemoglobin: 11.3 g/dL — ABNORMAL LOW (ref 13.2–17.1)
Lymphs Abs: 2108 cells/uL (ref 850–3900)
MCH: 33.1 pg — ABNORMAL HIGH (ref 27.0–33.0)
MCHC: 32.9 g/dL (ref 32.0–36.0)
MCV: 100.6 fL — ABNORMAL HIGH (ref 80.0–100.0)
MPV: 11.8 fL (ref 7.5–12.5)
Monocytes Relative: 6.6 %
Neutro Abs: 7586 cells/uL (ref 1500–7800)
Neutrophils Relative %: 70.9 %
Platelets: 148 10*3/uL (ref 140–400)
RBC: 3.41 10*6/uL — ABNORMAL LOW (ref 4.20–5.80)
RDW: 12.8 % (ref 11.0–15.0)
Total Lymphocyte: 19.7 %
WBC: 10.7 10*3/uL (ref 3.8–10.8)

## 2019-09-29 LAB — TSH: TSH: 1.22 mIU/L (ref 0.40–4.50)

## 2019-09-29 LAB — COMPLETE METABOLIC PANEL WITH GFR
AG Ratio: 1.1 (calc) (ref 1.0–2.5)
ALT: 5 U/L — ABNORMAL LOW (ref 9–46)
AST: 8 U/L — ABNORMAL LOW (ref 10–35)
Albumin: 3.5 g/dL — ABNORMAL LOW (ref 3.6–5.1)
Alkaline phosphatase (APISO): 67 U/L (ref 35–144)
BUN/Creatinine Ratio: 16 (calc) (ref 6–22)
BUN: 42 mg/dL — ABNORMAL HIGH (ref 7–25)
CO2: 20 mmol/L (ref 20–32)
Calcium: 8.9 mg/dL (ref 8.6–10.3)
Chloride: 114 mmol/L — ABNORMAL HIGH (ref 98–110)
Creat: 2.55 mg/dL — ABNORMAL HIGH (ref 0.70–1.18)
GFR, Est African American: 28 mL/min/{1.73_m2} — ABNORMAL LOW (ref >=60)
GFR, Est Non African American: 24 mL/min/{1.73_m2} — ABNORMAL LOW (ref >=60)
Globulin: 3.2 g/dL (calc) (ref 1.9–3.7)
Glucose, Bld: 102 mg/dL — ABNORMAL HIGH (ref 65–99)
Potassium: 5.2 mmol/L (ref 3.5–5.3)
Sodium: 141 mmol/L (ref 135–146)
Total Bilirubin: 0.3 mg/dL (ref 0.2–1.2)
Total Protein: 6.7 g/dL (ref 6.1–8.1)

## 2019-10-03 NOTE — Telephone Encounter (Signed)
Advanced Heart Failure Patient Advocate Encounter   Patient was approved to receive [name] from [address] [phone]).  Patient ID: [id number] Effective dates: 10/03/19 through 02/24/20  Was able to get representative to go ahead and send the prescription to the patient. It should take 7-10 business days. His refills will be shipped out automatically.  Called and spoke with patient. Updated Nira Conn, EMT as well.  Charlann Boxer, CPhT

## 2019-10-05 ENCOUNTER — Other Ambulatory Visit (HOSPITAL_COMMUNITY): Payer: Self-pay

## 2019-10-05 ENCOUNTER — Telehealth (HOSPITAL_COMMUNITY): Payer: Self-pay | Admitting: Pharmacy Technician

## 2019-10-05 ENCOUNTER — Other Ambulatory Visit (HOSPITAL_COMMUNITY): Payer: Self-pay | Admitting: *Deleted

## 2019-10-05 MED ORDER — IVABRADINE HCL 5 MG PO TABS: 5.0000 mg | ORAL_TABLET | Freq: Two times a day (BID) | ORAL | 3 refills | Status: DC

## 2019-10-05 NOTE — Telephone Encounter (Signed)
Received a message from [place], EMT regarding the patient's Corlanor. His co-pay is unaffordable without the PAN grant he previously had.  Started an application for [facility] patient assistance. Nira Conn will be by to take it to him to get a signature.  Will follow up.

## 2019-10-05 NOTE — Progress Notes (Signed)
Paramedicine Encounter    Patient ID: Brent [name], male    DOB: August 15, 1945, 74 y.o.   MRN: [id number]   Patient Care Team: Gayland Curry, DO as PCP - General (Geriatric Medicine) Martinique, Peter M, MD as PCP - Cardiology (Cardiology) Larey Dresser, MD as PCP - Advanced Heart Failure (Cardiology) Elmarie Shiley, MD (Nephrology) Milus Banister, MD (Gastroenterology)  Patient Active Problem List   Diagnosis Date Noted  . Hyperkalemia 08/26/2019  . Chronic systolic CHF (congestive heart failure) ([place]) 08/26/2019  . Type 2 diabetes mellitus with stage 3 chronic kidney disease ([place]) 08/26/2019  . Chronic obstructive pulmonary disease ([place]) 06/20/2019  . Ventricular tachycardia ([place]) 06/20/2019  . Acute on chronic systolic (congestive) heart failure ([place]) 04/25/2019  . Metabolic acidosis 63/33/5456  . Orthostasis 09/07/2018  . AKI (acute kidney injury) ([place])   . Immunosuppressed status ([place])   . Macrocytic anemia   . Chronic combined systolic and diastolic congestive heart failure ([place]) 06/03/2018  . Candida esophagitis ([place]) 09/22/2017  . History of smoking 30 or more pack years 01/05/2017  . Bipolar 1 disorder ([place])   . BPH (benign prostatic hyperplasia) 03/31/2013  . 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 Essential hypertension, benign 03/31/2013  . Acute renal failure superimposed on stage 3 chronic kidney disease ([place]) 06/04/2012  . 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  allopurinol (ZYLOPRIM) 100 MG tablet, Take 2 tablets (200 mg total) by mouth daily., Disp: 60 tablet, Rfl: 5 .  aspirin EC 81 MG tablet, Take 1 tablet (81 mg total) by mouth daily., Disp: 90 tablet, Rfl: 3 .  Cholecalciferol (VITAMIN D) 125 MCG (5000 UT) CAPS, Take 5,000 Units by mouth daily. , Disp: , Rfl:  .  dapagliflozin propanediol (FARXIGA) 10 MG TABS tablet, Take 1 tablet (10 mg total) by mouth daily before breakfast., Disp: 30 tablet, Rfl: 6 .   divalproex (DEPAKOTE ER) 500 MG 24 hr tablet, Take 1,500 mg by mouth at bedtime., Disp: , Rfl:  .  epoetin alfa (PROCRIT) 25638 UNIT/ML injection, Inject 10,000 Units into the skin every 30 (thirty) days. , Disp: , Rfl:  .  ferrous sulfate 325 (65 FE) MG tablet, Take 325 mg by mouth every Monday, Wednesday, and Friday. , Disp: , Rfl:  .  gentamicin (GARAMYCIN) 0.3 % ophthalmic solution, Place 1 drop into the left eye 3 (three) times daily., Disp: , Rfl:  .  hydrALAZINE (APRESOLINE) 25 MG tablet, Take 1 tablet (25 mg total) by mouth 2 (two) times daily., Disp: 60 tablet, Rfl: 3 .  inFLIXimab (REMICADE) 100 MG injection, Infuse Remicade IV schedule 1 54m/kg every 8 weeks Premedicate with Tylenol 500-6539mby mouth and Benadryl 25-5022my mouth prior to infusion. Last PPD was on 12/2009. , Disp: 1 each, Rfl: 6 .  isosorbide mononitrate (IMDUR) 30 MG 24 hr tablet, Take 1 tablet (30 mg total) by mouth daily., Disp: 90 tablet, Rfl: 3 .  ivabradine (CORLANOR) 5 MG TABS tablet, Take 1 tablet (5 mg total) by mouth 2 (two) times daily with a meal., Disp: 180 tablet, Rfl: 3 .  loperamide (IMODIUM) 2 MG capsule, Take 1 capsule (2 mg total) by mouth 4 (four) times daily as needed for diarrhea or loose stools., Disp: 12 capsule, Rfl: 0 .  magnesium oxide (MAG-OX) 400 MG tablet, Take 800 mg by mouth in the morning, at noon, and at bedtime. , Disp: , Rfl:  .  metoprolol succinate (  TOPROL-XL) 25 MG 24 hr tablet, Take 1 tablet (25 mg total) by mouth at bedtime., Disp: 90 tablet, Rfl: 3 .  OLANZapine (ZYPREXA) 7.5 MG tablet, Take 1 tablet (7.5 mg total) by mouth at bedtime., Disp: 30 tablet, Rfl: 11 .  omeprazole (PRILOSEC) 20 MG capsule, Take 20 mg by mouth daily before breakfast. , Disp: , Rfl:  .  tamsulosin (FLOMAX) 0.4 MG CAPS capsule, Take 1 capsule (0.4 mg total) by mouth daily., Disp: 30 capsule, Rfl: 2 .  triamcinolone (NASACORT ALLERGY 24HR) 55 MCG/ACT AERO nasal inhaler, Place 2 sprays into the nose daily as  needed (allergies)., Disp: , Rfl:  .  vitamin B-12 (CYANOCOBALAMIN) 1000 MCG tablet, Take 1,000 mcg by mouth daily., Disp: , Rfl:  Allergies  Allergen Reactions  . Azathioprine Other (See Comments)    REACTION: affected WBC "Almost died"  . Ciprofloxacin Other (See Comments)    Unknown rxn  . Levaquin [Levofloxacin In D5w] Other (See Comments)    Unknown rxn  . Plendil [Felodipine] Other (See Comments)    Unknown rxn     Social History   Socioeconomic History  . Marital status: Divorced    Spouse name: Not on file  . Number of children: 1  . Years of education: 7  . Highest education level: Not on file  Occupational History  . Occupation: retired  . Occupation: Veteran  Tobacco Use  . Smoking status: Former Smoker    Packs/day: 1.00    Years: 49.00    Pack years: 49.00    Types: Cigarettes    Start date: 04/11/1956    Quit date: 04/17/2014    Years since quitting: 5.4  . Smokeless tobacco: Never Used  Vaping Use  . Vaping Use: Never used  Substance and Sexual Activity  . Alcohol use: No    Alcohol/week: 0.0 standard drinks  . Drug use: No  . Sexual activity: Never  Other Topics Concern  . Not on file  Social History Narrative  . Not on file   Social Determinants of Health   Financial Resource Strain:   . Difficulty of Paying Living Expenses:   Food Insecurity:   . Worried About Charity fundraiser in the Last Year:   . Arboriculturist in the Last Year:   Transportation Needs:   . Film/video editor (Medical):   Marland Kitchen Lack of Transportation (Non-Medical):   Physical Activity:   . Days of Exercise per Week:   . Minutes of Exercise per Session:   Stress:   . Feeling of Stress :   Social Connections:   . Frequency of Communication with Friends and Family:   . Frequency of Social Gatherings with Friends and Family:   . Attends Religious Services:   . Active Member of Clubs or Organizations:   . Attends Archivist Meetings:   Marland Kitchen Marital Status:    Intimate Partner Violence:   . Fear of Current or Ex-Partner:   . Emotionally Abused:   Marland Kitchen Physically Abused:   . Sexually Abused:     Physical Exam Vitals reviewed.  HENT:     Head: Normocephalic.     Nose: Nose normal.     Mouth/Throat:     Mouth: Mucous membranes are moist.     Pharynx: Oropharynx is clear.  Eyes:     Pupils: Pupils are equal, round, and reactive to light.  Cardiovascular:     Rate and Rhythm: Normal rate and regular rhythm.  Pulses: Normal pulses.     Heart sounds: Normal heart sounds.  Pulmonary:     Effort: Pulmonary effort is normal.     Breath sounds: Normal breath sounds.  Abdominal:     General: Abdomen is flat.     Palpations: Abdomen is soft.  Musculoskeletal:        General: Normal range of motion.     Cervical back: Normal range of motion.  Skin:    General: Skin is warm and dry.     Capillary Refill: Capillary refill takes less than 2 seconds.  Neurological:     Mental Status: He is alert. Mental status is at baseline.  Psychiatric:        Mood and Affect: Mood normal.      Arrived for home visit for Brent [name] who was alert and oriented reporting he feels much better today than last week. Brent [name] was compliant with medications for the last week. Brent [name] stated he was evaluated by his PCP last week and they advised he could potentially be dehydrated. Brent [name] increased his fluid intake for a few days and he reports he is feeling much better. Vitals obtained and are as noted. Medications reviewed and confirmed. Pill box filled accordingly. I filled two pill boxes for Brent [name] however two meds needed in pill boxes.  -Farxiga Weds-Sat AM (#2 pill box) -Corlanor Sat AM (#1 pill box)                   everyday (#2 pill box)    Brent [name] has one more free month of Farxiga ready at [facility] in which he reports he will pick up. I will come out next week and place in box. Corlanor has no refills and [employer] assistance paperwork will be started for same. I  will see about samples or temporary fill with HF fund.   I will see Brent [name] in one week for med rec.      Future Appointments  Date Time Provider [place]  10/17/2019 10:30 AM [facility] None  10/20/2019 11:00 AM Hollace Kinnier L, DO [facility] None  11/14/2019  1:30 PM [facility] PA/NP [facility] None  01/24/2020  2:15 PM [name], [name], NP [facility] None     ACTION: Home visit completed Next visit planned for one week

## 2019-10-06 ENCOUNTER — Telehealth (HOSPITAL_COMMUNITY): Payer: Self-pay | Admitting: Licensed Clinical Social Worker

## 2019-10-06 NOTE — Telephone Encounter (Signed)
Community paramedic came by and picked up patient assistance application for pt to get help with Corlanor expenses.  CSW able to provide 1 bottle of Corlanor samples to paramedic to help pt until assistance program has made a determination as he is almost out of medication at this time  Will continue to follow and assist as needed  Jorge Ny, [facility] Desk#: [phone] Cell#: [phone]

## 2019-10-07 NOTE — Telephone Encounter (Signed)
Sent in application via fax.  Will follow up.  

## 2019-10-08 ENCOUNTER — Other Ambulatory Visit: Payer: Self-pay | Admitting: Internal Medicine

## 2019-10-08 DIAGNOSIS — N1832 Chronic kidney disease, stage 3b: Secondary | ICD-10-CM

## 2019-10-08 DIAGNOSIS — R531 Weakness: Secondary | ICD-10-CM

## 2019-10-08 DIAGNOSIS — R630 Anorexia: Secondary | ICD-10-CM

## 2019-10-08 DIAGNOSIS — E7800 Pure hypercholesterolemia, unspecified: Secondary | ICD-10-CM

## 2019-10-08 DIAGNOSIS — Z1159 Encounter for screening for other viral diseases: Secondary | ICD-10-CM

## 2019-10-08 DIAGNOSIS — I5042 Chronic combined systolic (congestive) and diastolic (congestive) heart failure: Secondary | ICD-10-CM

## 2019-10-08 DIAGNOSIS — K5000 Crohn's disease of small intestine without complications: Secondary | ICD-10-CM

## 2019-10-08 NOTE — Progress Notes (Signed)
Labs were incorrectly released and other labs entered at a different time so new labs had to be put in for date pt actually has appt b/c they became "overdue"

## 2019-10-12 DIAGNOSIS — H52203 Unspecified astigmatism, bilateral: Secondary | ICD-10-CM | POA: Diagnosis not present

## 2019-10-12 DIAGNOSIS — H524 Presbyopia: Secondary | ICD-10-CM | POA: Diagnosis not present

## 2019-10-12 DIAGNOSIS — H5203 Hypermetropia, bilateral: Secondary | ICD-10-CM | POA: Diagnosis not present

## 2019-10-12 DIAGNOSIS — Z961 Presence of intraocular lens: Secondary | ICD-10-CM | POA: Diagnosis not present

## 2019-10-12 NOTE — Telephone Encounter (Signed)
Advanced Heart Failure Patient Advocate Encounter   Patient was approved to receive Corlanor from [facility].  Patient ID: [id number] Effective dates: 10/11/19 through 02/24/20  Patient is aware. He should receive a shipment on Tuesday of next week.  Charlann Boxer, CPhT

## 2019-10-17 ENCOUNTER — Ambulatory Visit (HOSPITAL_COMMUNITY)
Admission: RE | Admit: 2019-10-17 | Discharge: 2019-10-17 | Disposition: A | Discharge status: Home / Self Care | Payer: Medicare Other | Source: Ambulatory Visit | Attending: Nephrology | Admitting: Nephrology

## 2019-10-17 ENCOUNTER — Other Ambulatory Visit: Payer: Self-pay

## 2019-10-17 DIAGNOSIS — D631 Anemia in chronic kidney disease: Secondary | ICD-10-CM | POA: Diagnosis not present

## 2019-10-17 DIAGNOSIS — N183 Chronic kidney disease, stage 3 unspecified: Secondary | ICD-10-CM | POA: Insufficient documentation

## 2019-10-17 LAB — IRON AND TIBC
Iron: 95 ug/dL (ref 45–182)
Saturation Ratios: 40 % — ABNORMAL HIGH (ref 17.9–39.5)
TIBC: 237 ug/dL — ABNORMAL LOW (ref 250–450)
UIBC: 142 ug/dL

## 2019-10-17 LAB — FERRITIN: Ferritin: 629 ng/mL — ABNORMAL HIGH (ref 24–336)

## 2019-10-17 LAB — HEMOGLOBIN AND HEMATOCRIT, BLOOD
HCT: 36.2 % — ABNORMAL LOW (ref 39.0–52.0)
Hemoglobin: 10.8 g/dL — ABNORMAL LOW (ref 13.0–17.0)

## 2019-10-17 MED ORDER — EPOETIN ALFA 10000 UNIT/ML IJ SOLN
10000.0000 [IU] | INTRAMUSCULAR | Status: DC
  Administered 2019-10-17: 10000 [IU] via SUBCUTANEOUS
  Filled 2019-10-17: qty 1

## 2019-10-17 NOTE — Progress Notes (Signed)
PATIENT CARE CENTER NOTE  Diagnosis:Anemia associated with Chronic Renal Failure, anemia associated with renal disease   Provider:Patel, Ulice Dash MD   Procedure:Epoetin Alfa (Procrit) injection   Note:Patient received sub-qProcritinjection inrightarm. Tolerated well. Labs drawn pre-injection and Hemoglobin was10.8. Patient's BP was120/59.Discharge instructions given. Patient alert, oriented and ambulatoryat discharge.

## 2019-10-17 NOTE — Discharge Instructions (Signed)

## 2019-10-19 ENCOUNTER — Other Ambulatory Visit (HOSPITAL_COMMUNITY): Payer: Self-pay

## 2019-10-19 ENCOUNTER — Other Ambulatory Visit: Payer: Medicare Other

## 2019-10-19 DIAGNOSIS — K50918 Crohn's disease, unspecified, with other complication: Secondary | ICD-10-CM | POA: Diagnosis not present

## 2019-10-19 NOTE — Progress Notes (Signed)
Paramedicine Encounter    Patient ID: Brent [name], male    DOB: 1945-08-18, 74 y.o.   MRN: [id number]   Patient Care Team: Gayland Curry, DO as PCP - General (Geriatric Medicine) Martinique, Peter M, MD as PCP - Cardiology (Cardiology) Larey Dresser, MD as PCP - Advanced Heart Failure (Cardiology) Elmarie Shiley, MD (Nephrology) Milus Banister, MD (Gastroenterology)  Patient Active Problem List   Diagnosis Date Noted   Decreased appetite 10/08/2019   Weakness 10/08/2019   Hyperkalemia 37/34/2876   Chronic systolic CHF (congestive heart failure) ([place]) 08/26/2019   Type 2 diabetes mellitus with stage 3 chronic kidney disease ([place]) 08/26/2019   Chronic obstructive pulmonary disease ([place]) 06/20/2019   Ventricular tachycardia ([place]) 06/20/2019   Acute on chronic systolic (congestive) heart failure ([place]) 81/15/7262   Metabolic acidosis 03/55/9741   Orthostasis 09/07/2018   AKI (acute kidney injury) ([place])    Immunosuppressed status ([place])    Macrocytic anemia    Chronic combined systolic and diastolic congestive heart failure ([place]) 06/03/2018   Candida esophagitis ([place]) 09/22/2017   History of smoking 30 or more pack years 01/05/2017   Bipolar 1 disorder ([place])    BPH (benign prostatic hyperplasia) 03/31/2013   Anemia in chronic kidney disease 03/31/2013   Essential hypertension, benign 03/31/2013   Acute renal failure superimposed on stage 3 chronic kidney disease ([place]) 06/04/2012   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[place]) 01/23/2010    Current Outpatient Medications:    allopurinol (ZYLOPRIM) 100 MG tablet, Take 2 tablets (200 mg total) by mouth daily., Disp: 60 tablet, Rfl: 5   aspirin EC 81 MG tablet, Take 1 tablet (81 mg total) by mouth daily., Disp: 90 tablet, Rfl: 3   Cholecalciferol (VITAMIN D) 125 MCG (5000 UT) CAPS, Take 5,000 Units by mouth daily. , Disp: , Rfl:    dapagliflozin propanediol (FARXIGA) 10 MG TABS tablet, Take 1 tablet (10 mg total) by mouth  daily before breakfast., Disp: 30 tablet, Rfl: 6   divalproex (DEPAKOTE ER) 500 MG 24 hr tablet, Take 1,500 mg by mouth at bedtime., Disp: , Rfl:    epoetin alfa (PROCRIT) 63845 UNIT/ML injection, Inject 10,000 Units into the skin every 30 (thirty) days. , Disp: , Rfl:    ferrous sulfate 325 (65 FE) MG tablet, Take 325 mg by mouth every Monday, Wednesday, and Friday. , Disp: , Rfl:    gentamicin (GARAMYCIN) 0.3 % ophthalmic solution, Place 1 drop into the left eye 3 (three) times daily., Disp: , Rfl:    hydrALAZINE (APRESOLINE) 25 MG tablet, Take 1 tablet (25 mg total) by mouth 2 (two) times daily., Disp: 60 tablet, Rfl: 3   inFLIXimab (REMICADE) 100 MG injection, Infuse Remicade IV schedule 1 26m/kg every 8 weeks Premedicate with Tylenol 500-6510mby mouth and Benadryl 25-5045my mouth prior to infusion. Last PPD was on 12/2009. , Disp: 1 each, Rfl: 6   isosorbide mononitrate (IMDUR) 30 MG 24 hr tablet, Take 1 tablet (30 mg total) by mouth daily., Disp: 90 tablet, Rfl: 3   ivabradine (CORLANOR) 5 MG TABS tablet, Take 1 tablet (5 mg total) by mouth 2 (two) times daily with a meal., Disp: 180 tablet, Rfl: 3   loperamide (IMODIUM) 2 MG capsule, Take 1 capsule (2 mg total) by mouth 4 (four) times daily as needed for diarrhea or loose stools., Disp: 12 capsule, Rfl: 0   magnesium oxide (MAG-OX) 400 MG tablet, Take 800 mg by mouth in the morning, at noon, and at bedtime. ,  Disp: , Rfl:    metoprolol succinate (TOPROL-XL) 25 MG 24 hr tablet, Take 1 tablet (25 mg total) by mouth at bedtime., Disp: 90 tablet, Rfl: 3   OLANZapine (ZYPREXA) 7.5 MG tablet, Take 1 tablet (7.5 mg total) by mouth at bedtime., Disp: 30 tablet, Rfl: 11   omeprazole (PRILOSEC) 20 MG capsule, Take 20 mg by mouth daily before breakfast. , Disp: , Rfl:    tamsulosin (FLOMAX) 0.4 MG CAPS capsule, Take 1 capsule (0.4 mg total) by mouth daily., Disp: 30 capsule, Rfl: 2   triamcinolone (NASACORT ALLERGY 24HR) 55 MCG/ACT AERO  nasal inhaler, Place 2 sprays into the nose daily as needed (allergies)., Disp: , Rfl:    vitamin B-12 (CYANOCOBALAMIN) 1000 MCG tablet, Take 1,000 mcg by mouth daily., Disp: , Rfl:  Allergies  Allergen Reactions   Azathioprine Other (See Comments)    REACTION: affected WBC "Almost died"   Ciprofloxacin Other (See Comments)    Unknown rxn   Levaquin [Levofloxacin In D5w] Other (See Comments)    Unknown rxn   Plendil [Felodipine] Other (See Comments)    Unknown rxn     Social History   Socioeconomic History   Marital status: Divorced    Spouse name: Not on file   Number of children: 1   Years of education: 12   Highest education level: Not on file  Occupational History   Occupation: retired   Occupation: Veteran  Tobacco Use   Smoking status: Former Smoker    Packs/day: 1.00    Years: 49.00    Pack years: 49.00    Types: Cigarettes    Start date: 04/11/1956    Quit date: 04/17/2014    Years since quitting: 5.5   Smokeless tobacco: Never Used  Vaping Use   Vaping Use: Never used  Substance and Sexual Activity   Alcohol use: No    Alcohol/week: 0.0 standard drinks   Drug use: No   Sexual activity: Never  Other Topics Concern   Not on file  Social History Narrative   Not on file   Social Determinants of Health   Financial Resource Strain:    Difficulty of Paying Living Expenses: Not on file  Food Insecurity:    Worried About Charity fundraiser in the Last Year: Not on file   [place] in the Last Year: Not on file  Transportation Needs:    Lack of Transportation (Medical): Not on file   Lack of Transportation (Non-Medical): Not on file  Physical Activity:    Days of Exercise per Week: Not on file   Minutes of Exercise per Session: Not on file  Stress:    Feeling of Stress : Not on file  Social Connections:    Frequency of Communication with Friends and Family: Not on file   Frequency of Social Gatherings with Friends and  Family: Not on file   Attends Religious Services: Not on file   Active Member of Clubs or Organizations: Not on file   Attends Archivist Meetings: Not on file   Marital Status: Not on file  Intimate Partner Violence:    Fear of Current or Ex-Partner: Not on file   Emotionally Abused: Not on file   Physically Abused: Not on file   Sexually Abused: Not on file    Physical Exam Vitals reviewed.  Constitutional:      Appearance: He is normal weight.  HENT:     Head: Normocephalic.  Nose: Nose normal.     Mouth/Throat:     Mouth: Mucous membranes are moist.     Pharynx: Oropharynx is clear.  Eyes:     Pupils: Pupils are equal, round, and reactive to light.  Cardiovascular:     Rate and Rhythm: Normal rate and regular rhythm.     Pulses: Normal pulses.     Heart sounds: Normal heart sounds.  Pulmonary:     Effort: Pulmonary effort is normal.     Breath sounds: Normal breath sounds.  Abdominal:     General: Abdomen is flat.     Palpations: Abdomen is soft.  Musculoskeletal:        General: Normal range of motion.     Cervical back: Normal range of motion.     Right lower leg: No edema.     Left lower leg: No edema.  Skin:    General: Skin is warm and dry.     Capillary Refill: Capillary refill takes less than 2 seconds.  Neurological:     General: No focal deficit present.     Mental Status: He is alert. Mental status is at baseline.  Psychiatric:        Mood and Affect: Mood normal.     Arrived for home visit for Brent [name] who was alert and oriented reporting he is feeling "great" with no complaints. Vitals obtained and are as noted. Assessment as noted no acute findings. Medications were reviewed. Corlanor and Farxiga successfully delivered from [facility]. Medications verified and pill box filled accordingly. Brent [name] made aware of upcoming appointments. Brent [name] agreed. Brent [name] agreed to visit in one week.   Brent [name] without MAG OXIDE, will  need refill for same. He reports he will get some over the counter until RX arrives.   Refills: Mag Oxide       Future Appointments  Date Time Provider [place]  10/20/2019 11:00 AM Hollace Kinnier L, DO [facility] None  11/14/2019  1:30 PM [facility] PA/NP [facility] None  11/17/2019 10:00 AM [facility] None  01/24/2020  2:15 PM [name], Dinah C, NP [facility] None     ACTION: Home visit completed Next visit planned for one week

## 2019-10-20 ENCOUNTER — Other Ambulatory Visit: Payer: Self-pay

## 2019-10-20 ENCOUNTER — Encounter: Payer: Self-pay | Admitting: Internal Medicine

## 2019-10-20 ENCOUNTER — Ambulatory Visit (INDEPENDENT_AMBULATORY_CARE_PROVIDER_SITE_OTHER): Payer: Medicare Other | Admitting: Internal Medicine

## 2019-10-20 ENCOUNTER — Other Ambulatory Visit: Payer: Medicare Other

## 2019-10-20 VITALS — BP 122/76 | HR 68 | Temp 97.0°F | Ht 76.0 in | Wt 179.1 lb

## 2019-10-20 DIAGNOSIS — K5000 Crohn's disease of small intestine without complications: Secondary | ICD-10-CM

## 2019-10-20 DIAGNOSIS — N1832 Chronic kidney disease, stage 3b: Secondary | ICD-10-CM | POA: Diagnosis not present

## 2019-10-20 DIAGNOSIS — D631 Anemia in chronic kidney disease: Secondary | ICD-10-CM

## 2019-10-20 DIAGNOSIS — R531 Weakness: Secondary | ICD-10-CM

## 2019-10-20 DIAGNOSIS — I5042 Chronic combined systolic (congestive) and diastolic (congestive) heart failure: Secondary | ICD-10-CM | POA: Diagnosis not present

## 2019-10-20 DIAGNOSIS — Z1159 Encounter for screening for other viral diseases: Secondary | ICD-10-CM

## 2019-10-20 DIAGNOSIS — F319 Bipolar disorder, unspecified: Secondary | ICD-10-CM

## 2019-10-20 DIAGNOSIS — E7800 Pure hypercholesterolemia, unspecified: Secondary | ICD-10-CM

## 2019-10-20 DIAGNOSIS — R630 Anorexia: Secondary | ICD-10-CM | POA: Diagnosis not present

## 2019-10-20 DIAGNOSIS — D899 Disorder involving the immune mechanism, unspecified: Secondary | ICD-10-CM

## 2019-10-20 MED ORDER — MAGNESIUM OXIDE 400 MG PO TABS: 800.0000 mg | ORAL_TABLET | Freq: Three times a day (TID) | ORAL | 2 refills | Status: DC

## 2019-10-20 NOTE — Patient Instructions (Addendum)
Try to eat more throughout the day.  Ideally 3 meals per day.

## 2019-10-20 NOTE — Progress Notes (Signed)
Location:  [facility] clinic Provider:  Anntoinette Haefele L. Mariea Clonts, D.O., C.M.D.  Code Status: MOST with full code Goals of Care:  Advanced Directives 10/20/2019  Does Patient Have a Medical Advance Directive? Yes  Type of Advance Directive Out of facility DNR (pink MOST or yellow form)  Does patient want to make changes to medical advance directive? No - Patient declined  Copy of [place] in Chart? -  Would patient like information on creating a medical advance directive? -  Pre-existing out of facility DNR order (yellow form or pink MOST form) Pink MOST/Yellow Form most recent copy in chart - Physician notified to receive inpatient order     Chief Complaint  Patient presents with  . Medical Management of Chronic Issues    4 month follow up/ lab results   . Health Maintenance    Hep C Screening, Influenza high dose is not in stock   . Acute Visit    low energy    HPI: Patient is a 74 y.o. male seen today for medical management of chronic diseases.    He's really been feeling low on energy the past two weeks.   Shortness of breath is no worse.  No chest pain.  No pain at all.   Had procrit after his labs 8/23.    He's taking his medications as directed including those for his bipolar.  Appetite is not good.  He has a friend who cooks him one meal.  He will eat that.  Otherwise he snacks.  Says it is increasing.   Says he's not the greatest cook in the world.  We reviewed his renal diet b/c he told me about getting carried away and eating a whole watermelon at one point.  He was asking about eating cantelope.  Renal diet handouts printed and then it turned out nephrology had just provided him one, also.  He's managing with his bowels.  GI changed his regimen so he can take more pills of loperamide so it's ok.  Past Medical History:  Diagnosis Date  . Anemia   . Anxiety   . Benign paroxysmal positional vertigo   . Bipolar I disorder, most recent episode (or current)  unspecified   . Celiac disease   . CHF (congestive heart failure) ([place])   . Chronic kidney disease, stage III (moderate)   . Crohn's    Remicade q8 weeks  . Depression   . Essential and other specified forms of tremor    medication-induced Parkinson's, now resolved  . Essential hypertension, benign   . Gout 2018  . Heart failure ([place])   . Hypertrophy of prostate without urinary obstruction and other lower urinary tract symptoms (LUTS)   . Impotence of organic origin   . Insomnia with sleep apnea, unspecified   . Iron deficiency anemia, unspecified   . 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 Neuralgia, neuritis, and radiculitis, unspecified   . Other B-complex deficiencies   . Other extrapyramidal disease and abnormal movement disorder   . Postinflammatory pulmonary fibrosis ([place])   . Tobacco use disorder   . Vertigo 2018    Past Surgical History:  Procedure Laterality Date  . CATARACT EXTRACTION, BILATERAL Bilateral 09/2018  . CHOLECYSTECTOMY  07-12-2010  . RIGHT HEART CATH N/A 10/12/2018   Procedure: RIGHT HEART CATH;  Surgeon: Jolaine Artist, MD;  Location: [facility];  Service: Cardiovascular;  Laterality: N/A;  . RIGHT/LEFT HEART CATH AND CORONARY ANGIOGRAPHY N/A 04/26/2019   Procedure: RIGHT/LEFT HEART  CATH AND CORONARY ANGIOGRAPHY;  Surgeon: Larey Dresser, MD;  Location: [facility];  Service: Cardiovascular;  Laterality: N/A;  . SMALL INTESTINE SURGERY     x 2    Allergies  Allergen Reactions  . Azathioprine Other (See Comments)    REACTION: affected WBC "Almost died"  . Ciprofloxacin Other (See Comments)    Unknown rxn  . Levaquin [Levofloxacin In D5w] Other (See Comments)    Unknown rxn  . Plendil [Felodipine] Other (See Comments)    Unknown rxn    Outpatient Encounter Medications as of 10/20/2019  Medication Sig  . allopurinol (ZYLOPRIM) 100 MG tablet Take 2 tablets (200 mg total) by mouth daily.  Marland Kitchen aspirin EC 81 MG tablet Take 1 tablet (81 mg total) by  mouth daily.  . Cholecalciferol (VITAMIN D) 125 MCG (5000 UT) CAPS Take 5,000 Units by mouth daily.   . dapagliflozin propanediol (FARXIGA) 10 MG TABS tablet Take 1 tablet (10 mg total) by mouth daily before breakfast.  . divalproex (DEPAKOTE ER) 500 MG 24 hr tablet Take 1,500 mg by mouth at bedtime.  Marland Kitchen epoetin alfa (PROCRIT) 38182 UNIT/ML injection Inject 10,000 Units into the skin every 30 (thirty) days.   . ferrous sulfate 325 (65 FE) MG tablet Take 325 mg by mouth every Monday, Wednesday, and Friday.   Marland Kitchen gentamicin (GARAMYCIN) 0.3 % ophthalmic solution Place 1 drop into the left eye 3 (three) times daily.  . hydrALAZINE (APRESOLINE) 25 MG tablet Take 1 tablet (25 mg total) by mouth 2 (two) times daily.  Marland Kitchen inFLIXimab (REMICADE) 100 MG injection Infuse Remicade IV schedule 1 59m/kg every 8 weeks Premedicate with Tylenol 500-6576mby mouth and Benadryl 25-5073my mouth prior to infusion. Last PPD was on 12/2009.   . isosorbide mononitrate (IMDUR) 30 MG 24 hr tablet Take 1 tablet (30 mg total) by mouth daily.  . ivabradine (CORLANOR) 5 MG TABS tablet Take 1 tablet (5 mg total) by mouth 2 (two) times daily with a meal.  . loperamide (IMODIUM) 2 MG capsule Take 1 capsule (2 mg total) by mouth 4 (four) times daily as needed for diarrhea or loose stools.  . magnesium oxide (MAG-OX) 400 MG tablet Take 2 tablets (800 mg total) by mouth in the morning, at noon, and at bedtime.  . metoprolol succinate (TOPROL-XL) 25 MG 24 hr tablet Take 1 tablet (25 mg total) by mouth at bedtime.  . OMarland KitchenANZapine (ZYPREXA) 7.5 MG tablet Take 1 tablet (7.5 mg total) by mouth at bedtime.  . oMarland Kitcheneprazole (PRILOSEC) 20 MG capsule Take 20 mg by mouth daily before breakfast.   . tamsulosin (FLOMAX) 0.4 MG CAPS capsule Take 1 capsule (0.4 mg total) by mouth daily.  . tMarland Kitcheniamcinolone (NASACORT ALLERGY 24HR) 55 MCG/ACT AERO nasal inhaler Place 2 sprays into the nose daily as needed (allergies).  . vitamin B-12 (CYANOCOBALAMIN) 1000 MCG  tablet Take 1,000 mcg by mouth daily.  . [DISCONTINUED] magnesium oxide (MAG-OX) 400 MG tablet Take 800 mg by mouth in the morning, at noon, and at bedtime.    No facility-administered encounter medications on file as of 10/20/2019.    Review of Systems:  Review of Systems  Constitutional: Positive for malaise/fatigue. Negative for chills, fever and weight loss.  HENT: Negative for congestion, hearing loss and sore throat.   Eyes: Negative for blurred vision.  Respiratory: Positive for shortness of breath. Negative for cough.        No change from baseline  Cardiovascular: Negative for chest  pain, palpitations and leg swelling.  Gastrointestinal: Positive for diarrhea. Negative for abdominal pain, blood in stool, constipation and melena.       Inflammatory bowel disease under control now  Genitourinary: Negative for dysuria.  Musculoskeletal: Negative for back pain, falls and joint pain.  Skin: Negative for itching and rash.  Neurological: Positive for weakness. Negative for dizziness and loss of consciousness.  Psychiatric/Behavioral: Negative for depression and memory loss. The patient is not nervous/anxious and does not have insomnia.        Controlled bipolar disorder    Health Maintenance  Topic Date Due  . Hepatitis C Screening  Never done  . INFLUENZA VACCINE  09/25/2019  . HEMOGLOBIN A1C  02/27/2020  . FOOT EXAM  08/09/2020  . OPHTHALMOLOGY EXAM  10/12/2020  . TETANUS/TDAP  05/25/2021  . COLONOSCOPY  09/17/2027  . COVID-19 Vaccine  Completed  . PNA vac Low Risk Adult  Completed    Physical Exam: Vitals:   10/20/19 1114  BP: 122/76  Pulse: 68  Temp: (!) 97 F (36.1 C)  TempSrc: Temporal  SpO2: 95%  Weight: 179 lb 1.6 oz (81.2 kg)  Height: 6' 4"  (1.93 m)   Body mass index is 21.8 kg/m. Physical Exam Vitals reviewed.  Constitutional:      General: He is not in acute distress.    Appearance: Normal appearance. He is not toxic-appearing.     Comments: Frail,  tall,thin male  HENT:     Head: Normocephalic and atraumatic.  Eyes:     Comments: glasses  Cardiovascular:     Rate and Rhythm: Normal rate and regular rhythm.     Heart sounds: No murmur heard.   Pulmonary:     Effort: Pulmonary effort is normal.     Breath sounds: Normal breath sounds. No rales.  Abdominal:     General: Bowel sounds are normal. There is no distension.     Tenderness: There is no abdominal tenderness.  Musculoskeletal:        General: Normal range of motion.     Right lower leg: No edema.     Left lower leg: No edema.  Skin:    General: Skin is warm and dry.  Neurological:     General: No focal deficit present.     Mental Status: He is alert and oriented to person, place, and time.     Motor: No weakness.     Gait: Gait normal.  Psychiatric:        Mood and Affect: Mood normal.     Labs reviewed: Basic Metabolic Panel: Recent Labs    08/27/19 0201 08/27/19 0201 08/28/19 0323 08/28/19 0323 09/06/19 1559 09/26/19 1416 09/28/19 0946  NA 140   < > 138   < > 141 139 141  K 5.5*   < > 5.3*   < > 5.3 5.3 5.2  CL 107   < > 103   < > 111* 111* 114*  CO2 25   < > 27   < > 22 20 20   GLUCOSE 127*   < > 97   < > 83 104* 102*  BUN 36*   < > 34*   < > 31* 38* 42*  CREATININE 2.52*   < > 2.57*  --  2.16* 2.49* 2.55*  CALCIUM 8.4*   < > 8.6*   < > 8.8 8.7 8.9  MG 1.8  --  1.6*  --  1.8  --   --  PHOS 4.4  --  3.9  --   --   --   --   TSH  --   --   --   --   --   --  1.22   < > = values in this interval not displayed.   Liver Function Tests: Recent Labs    08/27/19 0201 08/27/19 0201 08/28/19 0323 09/06/19 1559 09/28/19 0946  AST 11*   < > 11* 11 8*  ALT 9   < > 10 7* 5*  ALKPHOS 56  --  57  --   --   BILITOT 0.4   < > 0.4 0.4 0.3  PROT 5.6*   < > 6.2* 6.4 6.7  ALBUMIN 2.8*  --  3.0*  --   --    < > = values in this interval not displayed.   No results for input(s): LIPASE, AMYLASE in the last 8760 hours. No results for input(s): AMMONIA in  the last 8760 hours. CBC: Recent Labs    08/28/19 0323 08/28/19 0323 09/06/19 1559 09/06/19 1559 09/16/19 0959 09/28/19 0946 10/17/19 1043  WBC 9.0  --  9.0  --   --  10.7  --   NEUTROABS 3.6  --  0,093  --   --  7,586  --   HGB 9.8*   < > 10.3*   < > 10.7* 11.3* 10.8*  HCT 32.3*   < > 31.2*   < > 34.5* 34.3* 36.2*  MCV 108.4*  --  101.3*  --   --  100.6*  --   PLT 152  --  160  --   --  148  --    < > = values in this interval not displayed.   Lipid Panel: Recent Labs    04/19/19 1041  CHOL 140  HDL 54  LDLCALC 64  TRIG 110  CHOLHDL 2.6   Lab Results  Component Value Date   HGBA1C 5.1 08/27/2019   Assessment/Plan 1. Decreased appetite -encouraged 3 small meals per day rather than snacking and reviewed renal diet with him to avoid high potassium foods  2. Crohn's disease of small intestine without complication ([place]) -cont current mgt per GI, effective right now   3. Chronic combined systolic and diastolic congestive heart failure (H[facility]) -appears euvolemic, dyspnea at baseline and no edema   4. Anemia in stage 3b chronic kidney disease -just had procrit, may start feeling more energetic again soon  5. Bipolar 1 disorder ([place]) -cont current meds per psych at [facility]  6. Weakness - multfactorial with several chronic illnesses and poor po intake -again encouraged 3 small meals per day with proper renal diet, avoid high sodium foods also for chf  7. Disorder involving the immune mechanism, unspecified ([place]) -educated on covid booster due to his immunocompromised state, given phone number to call to set up his booster asap thru [facility]  Labs/tests ordered:   Lab Orders  No laboratory test(s) ordered today  will determine day of based on what other providers have done prior and his acute needs  Next appt:  02/23/2020  Cobi Aldape L. Delecia Vastine, D.O. [facility] [address], [place] [address] Cell Phone (Mon-Fri 8am-5pm):   [phone] On Call:  [phone] & follow prompts after 5pm & weekends Office Phone:  [phone] Office Fax:  [phone]

## 2019-10-21 LAB — LIPID PANEL
Cholesterol: 161 mg/dL (ref <200)
HDL: 54 mg/dL (ref >=40)
LDL Cholesterol (Calc): 86 mg/dL (calc)
Non-HDL Cholesterol (Calc): 107 mg/dL (calc) (ref <130)
Total CHOL/HDL Ratio: 3 (calc) (ref <5.0)
Triglycerides: 112 mg/dL (ref <150)

## 2019-10-21 LAB — COMPLETE METABOLIC PANEL WITH GFR
AG Ratio: 1.2 (calc) (ref 1.0–2.5)
ALT: 8 U/L — ABNORMAL LOW (ref 9–46)
AST: 11 U/L (ref 10–35)
Albumin: 3.6 g/dL (ref 3.6–5.1)
Alkaline phosphatase (APISO): 68 U/L (ref 35–144)
BUN/Creatinine Ratio: 13 (calc) (ref 6–22)
BUN: 27 mg/dL — ABNORMAL HIGH (ref 7–25)
CO2: 27 mmol/L (ref 20–32)
Calcium: 9.2 mg/dL (ref 8.6–10.3)
Chloride: 112 mmol/L — ABNORMAL HIGH (ref 98–110)
Creat: 2.08 mg/dL — ABNORMAL HIGH (ref 0.70–1.18)
GFR, Est African American: 35 mL/min/{1.73_m2} — ABNORMAL LOW (ref >=60)
GFR, Est Non African American: 30 mL/min/{1.73_m2} — ABNORMAL LOW (ref >=60)
Globulin: 3.1 g/dL (calc) (ref 1.9–3.7)
Glucose, Bld: 78 mg/dL (ref 65–99)
Potassium: 5.7 mmol/L — ABNORMAL HIGH (ref 3.5–5.3)
Sodium: 142 mmol/L (ref 135–146)
Total Bilirubin: 0.3 mg/dL (ref 0.2–1.2)
Total Protein: 6.7 g/dL (ref 6.1–8.1)

## 2019-10-21 LAB — CBC WITH DIFFERENTIAL/PLATELET
Absolute Monocytes: 551 cells/uL (ref 200–950)
Basophils Absolute: 41 cells/uL (ref 0–200)
Basophils Relative: 0.5 %
Eosinophils Absolute: 138 cells/uL (ref 15–500)
Eosinophils Relative: 1.7 %
HCT: 36.5 % — ABNORMAL LOW (ref 38.5–50.0)
Hemoglobin: 11.7 g/dL — ABNORMAL LOW (ref 13.2–17.1)
Lymphs Abs: 2552 cells/uL (ref 850–3900)
MCH: 32.8 pg (ref 27.0–33.0)
MCHC: 32.1 g/dL (ref 32.0–36.0)
MCV: 102.2 fL — ABNORMAL HIGH (ref 80.0–100.0)
MPV: 11.8 fL (ref 7.5–12.5)
Monocytes Relative: 6.8 %
Neutro Abs: 4820 cells/uL (ref 1500–7800)
Neutrophils Relative %: 59.5 %
Platelets: 136 10*3/uL — ABNORMAL LOW (ref 140–400)
RBC: 3.57 10*6/uL — ABNORMAL LOW (ref 4.20–5.80)
RDW: 12.8 % (ref 11.0–15.0)
Total Lymphocyte: 31.5 %
WBC: 8.1 10*3/uL (ref 3.8–10.8)

## 2019-10-21 LAB — TSH: TSH: 2.46 mIU/L (ref 0.40–4.50)

## 2019-10-21 LAB — HEPATITIS C ANTIBODY
Hepatitis C Ab: NONREACTIVE
SIGNAL TO CUT-OFF: 0.23 (ref <1.00)

## 2019-10-24 NOTE — Progress Notes (Signed)
Thyroid was normal Hepatitis c screen was negative Bad cholesterol has trended up a little bit over his goal.  With all of his medications, I would not add a cholesterol pill to the burden. Kidney function was looking a little bit better this time.   Potassium was high on Thursday.  We had discussed low potassium fruits during the visit and he was given handouts about better options for him.  His nephrologist had also given him one.  It's important he try to stick with the renal diet. Anemia is improve after his procrit to 11.7,platelets were a little low but not in worrisome range

## 2019-10-25 ENCOUNTER — Ambulatory Visit: Payer: Self-pay | Attending: Critical Care Medicine

## 2019-10-25 DIAGNOSIS — Z23 Encounter for immunization: Secondary | ICD-10-CM

## 2019-10-25 NOTE — Progress Notes (Signed)
° °  Covid-19 Vaccination Clinic  Name:  Brent [name]    MRN: [id number] DOB: 1945/11/18  10/25/2019  [name] was observed post Covid-19 immunization for 15 minutes without incident. He was provided with Vaccine Information Sheet and instruction to access the V-Safe system.   [name] was instructed to call 911 with any severe reactions post vaccine:  Difficulty breathing   Swelling of face and throat   A fast heartbeat   A bad rash all over body   Dizziness and weakness

## 2019-10-26 ENCOUNTER — Other Ambulatory Visit (HOSPITAL_COMMUNITY): Payer: Self-pay

## 2019-10-26 NOTE — Progress Notes (Signed)
Paramedicine Encounter    Patient ID: CYRIS MAALOUF, male    DOB: Dec 21, 1945, 74 y.o.   MRN: [id number]   Patient Care Team: Gayland Curry, DO as PCP - General (Geriatric Medicine) [place]ique, Peter M, MD as PCP - Cardiology (Cardiology) Larey Dresser, MD as PCP - Advanced Heart Failure (Cardiology) Elmarie Shiley, MD (Nephrology) Milus Banister, MD (Gastroenterology)  Patient Active Problem List   Diagnosis Date Noted  . Decreased appetite 10/08/2019  . Weakness 10/08/2019  . Hyperkalemia 08/26/2019  . Chronic systolic CHF (congestive heart failure) ([place]) 08/26/2019  . Type 2 diabetes mellitus with stage 3 chronic kidney disease ([place]) 08/26/2019  . Chronic obstructive pulmonary disease ([place]) 06/20/2019  . Ventricular tachycardia ([place]) 06/20/2019  . Acute on chronic systolic (congestive) heart failure ([place]) 04/25/2019  . Metabolic acidosis 25/63/8937  . Orthostasis 09/07/2018  . AKI (acute kidney injury) ([place])   . Immunosuppressed status ([place])   . Macrocytic anemia   . Chronic combined systolic and diastolic congestive heart failure ([place]) 06/03/2018  . Candida esophagitis ([place]) 09/22/2017  . History of smoking 30 or more pack years 01/05/2017  . Bipolar 1 disorder ([place])   . BPH (benign prostatic hyperplasia) 03/31/2013  . 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 Essential hypertension, benign 03/31/2013  . Acute renal failure superimposed on stage 3 chronic kidney disease ([place]) 06/04/2012  . 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[place]) 01/23/2010    Current Outpatient Medications:  .  allopurinol (ZYLOPRIM) 100 MG tablet, Take 2 tablets (200 mg total) by mouth daily., Disp: 60 tablet, Rfl: 5 .  aspirin EC 81 MG tablet, Take 1 tablet (81 mg total) by mouth daily., Disp: 90 tablet, Rfl: 3 .  Cholecalciferol (VITAMIN D) 125 MCG (5000 UT) CAPS, Take 5,000 Units by mouth daily. , Disp: , Rfl:  .  dapagliflozin propanediol (FARXIGA) 10 MG TABS tablet, Take 1 tablet (10 mg total) by mouth  daily before breakfast., Disp: 30 tablet, Rfl: 6 .  divalproex (DEPAKOTE ER) 500 MG 24 hr tablet, Take 1,500 mg by mouth at bedtime., Disp: , Rfl:  .  epoetin alfa (PROCRIT) 34287 UNIT/ML injection, Inject 10,000 Units into the skin every 30 (thirty) days. , Disp: , Rfl:  .  ferrous sulfate 325 (65 FE) MG tablet, Take 325 mg by mouth every Monday, Wednesday, and Friday. , Disp: , Rfl:  .  gentamicin (GARAMYCIN) 0.3 % ophthalmic solution, Place 1 drop into the left eye 3 (three) times daily., Disp: , Rfl:  .  hydrALAZINE (APRESOLINE) 25 MG tablet, Take 1 tablet (25 mg total) by mouth 2 (two) times daily., Disp: 60 tablet, Rfl: 3 .  inFLIXimab (REMICADE) 100 MG injection, Infuse Remicade IV schedule 1 95m/kg every 8 weeks Premedicate with Tylenol 500-6562mby mouth and Benadryl 25-5073my mouth prior to infusion. Last PPD was on 12/2009. , Disp: 1 each, Rfl: 6 .  isosorbide mononitrate (IMDUR) 30 MG 24 hr tablet, Take 1 tablet (30 mg total) by mouth daily., Disp: 90 tablet, Rfl: 3 .  ivabradine (CORLANOR) 5 MG TABS tablet, Take 1 tablet (5 mg total) by mouth 2 (two) times daily with a meal., Disp: 180 tablet, Rfl: 3 .  loperamide (IMODIUM) 2 MG capsule, Take 1 capsule (2 mg total) by mouth 4 (four) times daily as needed for diarrhea or loose stools., Disp: 12 capsule, Rfl: 0 .  magnesium oxide (MAG-OX) 400 MG tablet, Take 2 tablets (800 mg total) by mouth in the morning, at noon,  and at bedtime., Disp: 60 tablet, Rfl: 2 .  metoprolol succinate (TOPROL-XL) 25 MG 24 hr tablet, Take 1 tablet (25 mg total) by mouth at bedtime., Disp: 90 tablet, Rfl: 3 .  OLANZapine (ZYPREXA) 7.5 MG tablet, Take 1 tablet (7.5 mg total) by mouth at bedtime., Disp: 30 tablet, Rfl: 11 .  omeprazole (PRILOSEC) 20 MG capsule, Take 20 mg by mouth daily before breakfast. , Disp: , Rfl:  .  tamsulosin (FLOMAX) 0.4 MG CAPS capsule, Take 1 capsule (0.4 mg total) by mouth daily., Disp: 30 capsule, Rfl: 2 .  triamcinolone (NASACORT  ALLERGY 24HR) 55 MCG/ACT AERO nasal inhaler, Place 2 sprays into the nose daily as needed (allergies)., Disp: , Rfl:  .  vitamin B-12 (CYANOCOBALAMIN) 1000 MCG tablet, Take 1,000 mcg by mouth daily., Disp: , Rfl:  Allergies  Allergen Reactions  . Azathioprine Other (See Comments)    REACTION: affected WBC "Almost died"  . Ciprofloxacin Other (See Comments)    Unknown rxn  . Levaquin [Levofloxacin In D5w] Other (See Comments)    Unknown rxn  . Plendil [Felodipine] Other (See Comments)    Unknown rxn     Social History   Socioeconomic History  . Marital status: Divorced    Spouse name: Not on file  . Number of children: 1  . Years of education: 39  . Highest education level: Not on file  Occupational History  . Occupation: retired  . Occupation: Veteran  Tobacco Use  . Smoking status: Former Smoker    Packs/day: 1.00    Years: 49.00    Pack years: 49.00    Types: Cigarettes    Start date: 04/11/1956    Quit date: 04/17/2014    Years since quitting: 5.5  . Smokeless tobacco: Never Used  Vaping Use  . Vaping Use: Never used  Substance and Sexual Activity  . Alcohol use: No    Alcohol/week: 0.0 standard drinks  . Drug use: No  . Sexual activity: Never  Other Topics Concern  . Not on file  Social History Narrative  . Not on file   Social Determinants of Health   Financial Resource Strain:   . Difficulty of Paying Living Expenses: Not on file  Food Insecurity:   . Worried About Charity fundraiser in the Last Year: Not on file  . Ran Out of Food in the Last Year: Not on file  Transportation Needs:   . Lack of Transportation (Medical): Not on file  . Lack of Transportation (Non-Medical): Not on file  Physical Activity:   . Days of Exercise per Week: Not on file  . Minutes of Exercise per Session: Not on file  Stress:   . Feeling of Stress : Not on file  Social Connections:   . Frequency of Communication with Friends and Family: Not on file  . Frequency of Social  Gatherings with Friends and Family: Not on file  . Attends Religious Services: Not on file  . Active Member of Clubs or Organizations: Not on file  . Attends Archivist Meetings: Not on file  . Marital Status: Not on file  Intimate Partner Violence:   . Fear of Current or Ex-Partner: Not on file  . Emotionally Abused: Not on file  . Physically Abused: Not on file  . Sexually Abused: Not on file    Physical Exam Constitutional:      Appearance: He is normal weight.  HENT:     Head: Normocephalic.  Nose: Nose normal.     Mouth/Throat:     Mouth: Mucous membranes are moist.     Pharynx: Oropharynx is clear.  Eyes:     Pupils: Pupils are equal, round, and reactive to light.  Cardiovascular:     Rate and Rhythm: Normal rate and regular rhythm.     Pulses: Normal pulses.     Heart sounds: Normal heart sounds.  Pulmonary:     Effort: Pulmonary effort is normal.     Breath sounds: Normal breath sounds.  Abdominal:     General: Abdomen is flat.     Palpations: Abdomen is soft.  Musculoskeletal:        General: Normal range of motion.     Cervical back: Normal range of motion.     Right lower leg: No edema.     Left lower leg: No edema.  Skin:    General: Skin is warm and dry.     Capillary Refill: Capillary refill takes less than 2 seconds.  Neurological:     Mental Status: He is alert. Mental status is at baseline.  Psychiatric:        Mood and Affect: Mood normal.     Arrived for home visit for Page who was alert and oriented seated in his apartment reporting he feels great. Notes reviewed from recent visit and medications reviewed. Pill box filled accordingly. Vitals as noted. [place] had no complaints today and assessment is as noted. [place] agreed to visit in one week. Home visit complete.   Refills: NONE    Future Appointments  Date Time Provider [place]  11/14/2019  1:30 PM [facility] PA/NP [facility] None  11/17/2019 10:00 AM [facility]  None  01/24/2020  2:15 PM [name], Dinah C, NP [facility] None  02/23/2020 11:00 AM Reed, Tiffany L, DO [facility] None     ACTION: Home visit completed Next visit planned for one week

## 2019-11-02 ENCOUNTER — Other Ambulatory Visit (HOSPITAL_COMMUNITY): Payer: Self-pay

## 2019-11-02 NOTE — Progress Notes (Signed)
Paramedicine Encounter    Patient ID: DEONTA BOMBERGER, male    DOB: 01-15-1946, 74 y.o.   MRN: [id number]   Patient Care Team: Gayland Curry, DO as PCP - General (Geriatric Medicine) [place], Peter M, MD as PCP - Cardiology (Cardiology) Larey Dresser, MD as PCP - Advanced Heart Failure (Cardiology) Elmarie Shiley, MD (Nephrology) Milus Banister, MD (Gastroenterology)  Patient Active Problem List   Diagnosis Date Noted  . Decreased appetite 10/08/2019  . Weakness 10/08/2019  . Hyperkalemia 08/26/2019  . Chronic systolic CHF (congestive heart failure) ([place]) 08/26/2019  . Type 2 diabetes mellitus with stage 3 chronic kidney disease ([place]) 08/26/2019  . Chronic obstructive pulmonary disease ([place]) 06/20/2019  . Ventricular tachycardia ([place]) 06/20/2019  . Acute on chronic systolic (congestive) heart failure ([place]) 04/25/2019  . Metabolic acidosis 03/00/9233  . Orthostasis 09/07/2018  . AKI (acute kidney injury) ([place])   . Immunosuppressed status ([place])   . Macrocytic anemia   . Chronic combined systolic and diastolic congestive heart failure ([place]) 06/03/2018  . Candida esophagitis ([place]) 09/22/2017  . History of smoking 30 or more pack years 01/05/2017  . Bipolar 1 disorder ([place])   . BPH (benign prostatic hyperplasia) 03/31/2013  . 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 Essential hypertension, benign 03/31/2013  . Acute renal failure superimposed on stage 3 chronic kidney disease ([place]) 06/04/2012  . 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[place]) 01/23/2010    Current Outpatient Medications:  .  allopurinol (ZYLOPRIM) 100 MG tablet, Take 2 tablets (200 mg total) by mouth daily., Disp: 60 tablet, Rfl: 5 .  aspirin EC 81 MG tablet, Take 1 tablet (81 mg total) by mouth daily., Disp: 90 tablet, Rfl: 3 .  Cholecalciferol (VITAMIN D) 125 MCG (5000 UT) CAPS, Take 5,000 Units by mouth daily. , Disp: , Rfl:  .  dapagliflozin propanediol (FARXIGA) 10 MG TABS tablet, Take 1 tablet (10 mg total) by mouth  daily before breakfast., Disp: 30 tablet, Rfl: 6 .  divalproex (DEPAKOTE ER) 500 MG 24 hr tablet, Take 1,500 mg by mouth at bedtime., Disp: , Rfl:  .  epoetin alfa (PROCRIT) 00762 UNIT/ML injection, Inject 10,000 Units into the skin every 30 (thirty) days. , Disp: , Rfl:  .  ferrous sulfate 325 (65 FE) MG tablet, Take 325 mg by mouth every Monday, Wednesday, and Friday. , Disp: , Rfl:  .  gentamicin (GARAMYCIN) 0.3 % ophthalmic solution, Place 1 drop into the left eye 3 (three) times daily., Disp: , Rfl:  .  hydrALAZINE (APRESOLINE) 25 MG tablet, Take 1 tablet (25 mg total) by mouth 2 (two) times daily., Disp: 60 tablet, Rfl: 3 .  inFLIXimab (REMICADE) 100 MG injection, Infuse Remicade IV schedule 1 62m/kg every 8 weeks Premedicate with Tylenol 500-6578mby mouth and Benadryl 25-5028my mouth prior to infusion. Last PPD was on 12/2009. , Disp: 1 each, Rfl: 6 .  isosorbide mononitrate (IMDUR) 30 MG 24 hr tablet, Take 1 tablet (30 mg total) by mouth daily., Disp: 90 tablet, Rfl: 3 .  ivabradine (CORLANOR) 5 MG TABS tablet, Take 1 tablet (5 mg total) by mouth 2 (two) times daily with a meal., Disp: 180 tablet, Rfl: 3 .  loperamide (IMODIUM) 2 MG capsule, Take 1 capsule (2 mg total) by mouth 4 (four) times daily as needed for diarrhea or loose stools., Disp: 12 capsule, Rfl: 0 .  magnesium oxide (MAG-OX) 400 MG tablet, Take 2 tablets (800 mg total) by mouth in the morning, at noon,  and at bedtime., Disp: 60 tablet, Rfl: 2 .  metoprolol succinate (TOPROL-XL) 25 MG 24 hr tablet, Take 1 tablet (25 mg total) by mouth at bedtime., Disp: 90 tablet, Rfl: 3 .  OLANZapine (ZYPREXA) 7.5 MG tablet, Take 1 tablet (7.5 mg total) by mouth at bedtime., Disp: 30 tablet, Rfl: 11 .  omeprazole (PRILOSEC) 20 MG capsule, Take 20 mg by mouth daily before breakfast. , Disp: , Rfl:  .  tamsulosin (FLOMAX) 0.4 MG CAPS capsule, Take 1 capsule (0.4 mg total) by mouth daily., Disp: 30 capsule, Rfl: 2 .  triamcinolone (NASACORT  ALLERGY 24HR) 55 MCG/ACT AERO nasal inhaler, Place 2 sprays into the nose daily as needed (allergies)., Disp: , Rfl:  .  vitamin B-12 (CYANOCOBALAMIN) 1000 MCG tablet, Take 1,000 mcg by mouth daily., Disp: , Rfl:  Allergies  Allergen Reactions  . Azathioprine Other (See Comments)    REACTION: affected WBC "Almost died"  . Ciprofloxacin Other (See Comments)    Unknown rxn  . Levaquin [Levofloxacin In D5w] Other (See Comments)    Unknown rxn  . Plendil [Felodipine] Other (See Comments)    Unknown rxn     Social History   Socioeconomic History  . Marital status: Divorced    Spouse name: Not on file  . Number of children: 1  . Years of education: 73  . Highest education level: Not on file  Occupational History  . Occupation: retired  . Occupation: Veteran  Tobacco Use  . Smoking status: Former Smoker    Packs/day: 1.00    Years: 49.00    Pack years: 49.00    Types: Cigarettes    Start date: 04/11/1956    Quit date: 04/17/2014    Years since quitting: 5.5  . Smokeless tobacco: Never Used  Vaping Use  . Vaping Use: Never used  Substance and Sexual Activity  . Alcohol use: No    Alcohol/week: 0.0 standard drinks  . Drug use: No  . Sexual activity: Never  Other Topics Concern  . Not on file  Social History Narrative  . Not on file   Social Determinants of Health   Financial Resource Strain:   . Difficulty of Paying Living Expenses: Not on file  Food Insecurity:   . Worried About Charity fundraiser in the Last Year: Not on file  . Ran Out of Food in the Last Year: Not on file  Transportation Needs:   . Lack of Transportation (Medical): Not on file  . Lack of Transportation (Non-Medical): Not on file  Physical Activity:   . Days of Exercise per Week: Not on file  . Minutes of Exercise per Session: Not on file  Stress:   . Feeling of Stress : Not on file  Social Connections:   . Frequency of Communication with Friends and Family: Not on file  . Frequency of Social  Gatherings with Friends and Family: Not on file  . Attends Religious Services: Not on file  . Active Member of Clubs or Organizations: Not on file  . Attends Archivist Meetings: Not on file  . Marital Status: Not on file  Intimate Partner Violence:   . Fear of Current or Ex-Partner: Not on file  . Emotionally Abused: Not on file  . Physically Abused: Not on file  . Sexually Abused: Not on file    Physical Exam Vitals reviewed.  Constitutional:      Appearance: He is normal weight.  HENT:     Head: Normocephalic.  Nose: Nose normal.     Mouth/Throat:     Mouth: Mucous membranes are moist.     Pharynx: Oropharynx is clear.  Eyes:     Pupils: Pupils are equal, round, and reactive to light.  Cardiovascular:     Rate and Rhythm: Normal rate and regular rhythm.     Pulses: Normal pulses.     Heart sounds: Normal heart sounds.  Pulmonary:     Effort: Pulmonary effort is normal.     Breath sounds: Normal breath sounds.  Abdominal:     General: Abdomen is flat.     Palpations: Abdomen is soft.  Musculoskeletal:        General: Normal range of motion.     Cervical back: Normal range of motion.     Right lower leg: No edema.     Left lower leg: No edema.  Skin:    General: Skin is warm and dry.     Capillary Refill: Capillary refill takes less than 2 seconds.  Neurological:     Mental Status: He is alert. Mental status is at baseline.  Psychiatric:        Mood and Affect: Mood normal.     Arrived for home visit for Hiroki who was alert and oriented seated in his recliner in his living room reporting he has some swelling in his feet but also reports he has been eating salty food over the last several days. I educated [name] on same. [name] has been compliant with his medications. Vitals obtained. Meds verified and pill box filled. [name] had no complaints of shortness of breath or chest pain but reports his dizziness is the same as always. Home visit complete.  I will see patient in one week.   Refills:  Mag Oxide Olanzipine     Future Appointments  Date Time Provider [place]  11/14/2019  1:30 PM [facility] PA/NP [facility] None  11/17/2019 10:00 AM [facility] None  01/24/2020  2:15 PM [name], Dinah C, NP [facility] None  02/23/2020 11:00 AM Reed, Tiffany L, DO [facility] None     ACTION: Home visit completed Next visit planned for one week

## 2019-11-09 ENCOUNTER — Other Ambulatory Visit (HOSPITAL_COMMUNITY): Payer: Self-pay

## 2019-11-09 NOTE — Progress Notes (Signed)
Paramedicine Encounter    Patient ID: ESAU FRIDMAN, male    DOB: 01/08/1946, 74 y.o.   MRN: [id number]   Patient Care Team: Gayland Curry, DO as PCP - General (Geriatric Medicine) [place], Peter M, MD as PCP - Cardiology (Cardiology) Larey Dresser, MD as PCP - Advanced Heart Failure (Cardiology) Elmarie Shiley, MD (Nephrology) Milus Banister, MD (Gastroenterology)  Patient Active Problem List   Diagnosis Date Noted   Decreased appetite 10/08/2019   Weakness 10/08/2019   Hyperkalemia 32/20/2542   Chronic systolic CHF (congestive heart failure) ([place]) 08/26/2019   Type 2 diabetes mellitus with stage 3 chronic kidney disease ([place]) 08/26/2019   Chronic obstructive pulmonary disease ([place]) 06/20/2019   Ventricular tachycardia ([place]) 06/20/2019   Acute on chronic systolic (congestive) heart failure ([place]) 70/62/3762   Metabolic acidosis 83/15/1761   Orthostasis 09/07/2018   AKI (acute kidney injury) ([place])    Immunosuppressed status ([place])    Macrocytic anemia    Chronic combined systolic and diastolic congestive heart failure ([place]) 06/03/2018   Candida esophagitis ([place]) 09/22/2017   History of smoking 30 or more pack years 01/05/2017   Bipolar 1 disorder ([place])    BPH (benign prostatic hyperplasia) 03/31/2013   Anemia in chronic kidney disease 03/31/2013   Essential hypertension, benign 03/31/2013   Acute renal failure superimposed on stage 3 chronic kidney disease ([place]) 06/04/2012   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[place]) 01/23/2010    Current Outpatient Medications:    allopurinol (ZYLOPRIM) 100 MG tablet, Take 2 tablets (200 mg total) by mouth daily., Disp: 60 tablet, Rfl: 5   aspirin EC 81 MG tablet, Take 1 tablet (81 mg total) by mouth daily., Disp: 90 tablet, Rfl: 3   Cholecalciferol (VITAMIN D) 125 MCG (5000 UT) CAPS, Take 5,000 Units by mouth daily. , Disp: , Rfl:    dapagliflozin propanediol (FARXIGA) 10 MG TABS tablet, Take 1 tablet (10 mg total) by mouth  daily before breakfast., Disp: 30 tablet, Rfl: 6   divalproex (DEPAKOTE ER) 500 MG 24 hr tablet, Take 1,500 mg by mouth at bedtime., Disp: , Rfl:    epoetin alfa (PROCRIT) 60737 UNIT/ML injection, Inject 10,000 Units into the skin every 30 (thirty) days. , Disp: , Rfl:    ferrous sulfate 325 (65 FE) MG tablet, Take 325 mg by mouth every Monday, Wednesday, and Friday. , Disp: , Rfl:    gentamicin (GARAMYCIN) 0.3 % ophthalmic solution, Place 1 drop into the left eye 3 (three) times daily., Disp: , Rfl:    hydrALAZINE (APRESOLINE) 25 MG tablet, Take 1 tablet (25 mg total) by mouth 2 (two) times daily., Disp: 60 tablet, Rfl: 3   inFLIXimab (REMICADE) 100 MG injection, Infuse Remicade IV schedule 1 47m/kg every 8 weeks Premedicate with Tylenol 500-6553mby mouth and Benadryl 25-5038my mouth prior to infusion. Last PPD was on 12/2009. , Disp: 1 each, Rfl: 6   isosorbide mononitrate (IMDUR) 30 MG 24 hr tablet, Take 1 tablet (30 mg total) by mouth daily., Disp: 90 tablet, Rfl: 3   ivabradine (CORLANOR) 5 MG TABS tablet, Take 1 tablet (5 mg total) by mouth 2 (two) times daily with a meal., Disp: 180 tablet, Rfl: 3   loperamide (IMODIUM) 2 MG capsule, Take 1 capsule (2 mg total) by mouth 4 (four) times daily as needed for diarrhea or loose stools., Disp: 12 capsule, Rfl: 0   magnesium oxide (MAG-OX) 400 MG tablet, Take 2 tablets (800 mg total) by mouth in the morning, at noon,  and at bedtime., Disp: 60 tablet, Rfl: 2   metoprolol succinate (TOPROL-XL) 25 MG 24 hr tablet, Take 1 tablet (25 mg total) by mouth at bedtime., Disp: 90 tablet, Rfl: 3   OLANZapine (ZYPREXA) 7.5 MG tablet, Take 1 tablet (7.5 mg total) by mouth at bedtime., Disp: 30 tablet, Rfl: 11   omeprazole (PRILOSEC) 20 MG capsule, Take 20 mg by mouth daily before breakfast. , Disp: , Rfl:    tamsulosin (FLOMAX) 0.4 MG CAPS capsule, Take 1 capsule (0.4 mg total) by mouth daily., Disp: 30 capsule, Rfl: 2   triamcinolone (NASACORT  ALLERGY 24HR) 55 MCG/ACT AERO nasal inhaler, Place 2 sprays into the nose daily as needed (allergies)., Disp: , Rfl:    vitamin B-12 (CYANOCOBALAMIN) 1000 MCG tablet, Take 1,000 mcg by mouth daily., Disp: , Rfl:  Allergies  Allergen Reactions   Azathioprine Other (See Comments)    REACTION: affected WBC "Almost died"   Ciprofloxacin Other (See Comments)    Unknown rxn   Levaquin [Levofloxacin In D5w] Other (See Comments)    Unknown rxn   Plendil [Felodipine] Other (See Comments)    Unknown rxn     Social History   Socioeconomic History   Marital status: Divorced    Spouse name: Not on file   Number of children: 1   Years of education: 12   Highest education level: Not on file  Occupational History   Occupation: retired   Occupation: Veteran  Tobacco Use   Smoking status: Former Smoker    Packs/day: 1.00    Years: 49.00    Pack years: 49.00    Types: Cigarettes    Start date: 04/11/1956    Quit date: 04/17/2014    Years since quitting: 5.5   Smokeless tobacco: Never Used  Vaping Use   Vaping Use: Never used  Substance and Sexual Activity   Alcohol use: No    Alcohol/week: 0.0 standard drinks   Drug use: No   Sexual activity: Never  Other Topics Concern   Not on file  Social History Narrative   Not on file   Social Determinants of Health   Financial Resource Strain:    Difficulty of Paying Living Expenses: Not on file  Food Insecurity:    Worried About Charity fundraiser in the Last Year: Not on file   [place] in the Last Year: Not on file  Transportation Needs:    Lack of Transportation (Medical): Not on file   Lack of Transportation (Non-Medical): Not on file  Physical Activity:    Days of Exercise per Week: Not on file   Minutes of Exercise per Session: Not on file  Stress:    Feeling of Stress : Not on file  Social Connections:    Frequency of Communication with Friends and Family: Not on file   Frequency of Social  Gatherings with Friends and Family: Not on file   Attends Religious Services: Not on file   Active Member of Clubs or Organizations: Not on file   Attends Archivist Meetings: Not on file   Marital Status: Not on file  Intimate Partner Violence:    Fear of Current or Ex-Partner: Not on file   Emotionally Abused: Not on file   Physically Abused: Not on file   Sexually Abused: Not on file    Physical Exam Vitals reviewed.  Constitutional:      Appearance: He is normal weight.  HENT:     Head: Normocephalic.  Nose: Nose normal.     Mouth/Throat:     Mouth: Mucous membranes are moist.     Pharynx: Oropharynx is clear.  Eyes:     Pupils: Pupils are equal, round, and reactive to light.  Cardiovascular:     Rate and Rhythm: Normal rate and regular rhythm.     Pulses: Normal pulses.     Heart sounds: Normal heart sounds.  Pulmonary:     Effort: Pulmonary effort is normal.     Breath sounds: Normal breath sounds.  Abdominal:     General: Abdomen is flat.     Palpations: Abdomen is soft.  Musculoskeletal:        General: Normal range of motion.     Cervical back: Normal range of motion.     Right lower leg: Edema present.     Left lower leg: Edema present.  Skin:    General: Skin is warm and dry.     Capillary Refill: Capillary refill takes less than 2 seconds.  Neurological:     General: No focal deficit present.     Mental Status: He is alert.  Psychiatric:        Mood and Affect: Mood normal.     Arrived for home visit for Londyn who was alert and oriented reporting he was feeling good with no complaints today. Vitals were obtained. Weight noted to be up 2 pounds. Conroy reports he had not been watching his diet over the last week. Aboubacar was educated on proper foods he should be eating, he verbalized understanding. Meds were verified and confirmed. Pill box filled accordingly. I will see Darian in one week. Home visit complete.   NO REFILLS    Future  Appointments  Date Time Provider [place]  11/14/2019  1:30 PM [facility] PA/NP [facility] None  11/17/2019 10:00 AM [facility] None  01/24/2020  2:15 PM [name], Dinah C, NP [facility] None  02/23/2020 11:00 AM Reed, Tiffany L, DO [facility] None     ACTION: Home visit completed Next visit planned for one week

## 2019-11-13 NOTE — Progress Notes (Signed)
PCP: Gayland Curry, DO Cardiology: Dr. Leandro Reasoner Brent [name] is a 74 y.o. with a history of Crohn's disease, celiac disease,HTN, COPD,priortobacco use, CKD 3, chronic anemia, and bipolar disorder.Diagnosed with systolic HF in 4/40 (echo with EF 30-35%).   Admitted 3/11-3/16/20 with SOB and hypoxia. Found to have PNA and required BIPAP and antibiotics. Blood cultures were negative. Echo showed newly reduced EF 30-35%. Cardiology consulted and he had nuclear stress test, which was read as "high risk" due to low EF. Per Dr Sallyanne Kuster, did not think changes were indicative of coronary disease and cath was not pursued due this and due to CKD. HF meds optimized. Limited with CKD3. He had one short run of NSVT. Also had AKI on CKD3, but creatinine improved to baseline 1.77 on day of discharge.  He was seen in ED 05/12/18 with SOB after misplacing his nebulizer. BNP was elevated, so he was given 40 mg IV lasix and discharged with lasix 20 mg BID. Referred to HF clinic.  He was admitted again in 7/20 with weakness, orthostatic hypotension, and AKI.  Cardiac meds were held.  He was noted to be severely deconditioned, and he was discharged to SNF.  He remained in SNF x 2 wks and is now home.   Echo was done in 8/20, EF 20-25% with mild LVH and normal RV.  He had RHC in 8/20 showing normal filling pressures and good cardiac output (surprisingly).   LHC/RHC in 3/21 showed no significant CAD, low filling pressures, preserved cardiac output.   Today he returns for HF follow up.Overall feeling fine. Tries to walk around the grocery store. SOB walking back to the car from the grocery store. Denies PND/Orthopnea. Appetite ok and has been eating hot dogs. No fever or chills. Weight at home 180 pounds. Taking all medications. Followed by HF Paramedicine.   Labs (7/20): K 5.1, creatinine 2.25 Labs (8/20): K 4.8, creatinine 2.65 Labs (9/20): hgb 9.7 Labs (12/20): K 4.1, creatinine 1.76 Labs (3/21): K 5,  creatinine 2.25 Labs (6/21): hgb 12  ECG (personally reviewed): NSR 75 bpm   PMH: 1. Crohns disease: Gets Remicade.  2. Celiac disease.  3. COPD: Prior smoker.  - CT chest (7/20) with mild emphysema.  - PFTs (9/20): Mild obstruction, mild restriction.  - High resolution CT chest (10/20): No ILD, +chronic bronchitis.  4. CKD: Stage 3.  5. HTN 6. Bipolar disorder 7. H/o BPPV 8. Gout 9. Chronic systolic CHF: Etiology uncertain.  - Echo (3/20): EF 30-35%, mild LVH, normal RV.  - Cardiolite (3/20): EF 28%, no ischemia, fixed defects in anteroseptal and inferolateral walls.  - Echo (8/20): EF 20-25%, diffuse hypokinesis, mild LVH, normal RV size and systolic function, normal IVC size.  - RHC (8/20): mean RA 3, PA 21/10, mean PCWP 5, CI 3.2 Fick and 3.5 Thermo - RHC/LHC (3/21): No significant CAD; mean RA 1, PA 20/6, mean PCWP 6, CI 4.57  Social History   Socioeconomic History  . Marital status: Divorced    Spouse name: Not on file  . Number of children: 1  . Years of education: 49  . Highest education level: Not on file  Occupational History  . Occupation: retired  . Occupation: Veteran  Tobacco Use  . Smoking status: Former Smoker    Packs/day: 1.00    Years: 49.00    Pack years: 49.00    Types: Cigarettes    Start date: 04/11/1956    Quit date: 04/17/2014    Years  since quitting: 5.5  . Smokeless tobacco: Never Used  Vaping Use  . Vaping Use: Never used  Substance and Sexual Activity  . Alcohol use: No    Alcohol/week: 0.0 standard drinks  . Drug use: No  . Sexual activity: Never  Other Topics Concern  . Not on file  Social History Narrative  . Not on file   Social Determinants of Health   Financial Resource Strain:   . Difficulty of Paying Living Expenses: Not on file  Food Insecurity:   . Worried About Charity fundraiser in the Last Year: Not on file  . Ran Out of Food in the Last Year: Not on file  Transportation Needs:   . Lack of Transportation  (Medical): Not on file  . Lack of Transportation (Non-Medical): Not on file  Physical Activity:   . Days of Exercise per Week: Not on file  . Minutes of Exercise per Session: Not on file  Stress:   . Feeling of Stress : Not on file  Social Connections:   . Frequency of Communication with Friends and Family: Not on file  . Frequency of Social Gatherings with Friends and Family: Not on file  . Attends Religious Services: Not on file  . Active Member of Clubs or Organizations: Not on file  . Attends Archivist Meetings: Not on file  . Marital Status: Not on file  Intimate Partner Violence:   . Fear of Current or Ex-Partner: Not on file  . Emotionally Abused: Not on file  . Physically Abused: Not on file  . Sexually Abused: Not on file   Family History  Problem Relation Age of Onset  . Diabetes Mother        maternal grandmother  . Uterine cancer Mother   . Emphysema Father   . Pneumonia Maternal Grandmother   . Colon cancer Neg Hx    ROS: All systems reviewed and negative except as per HPI.   Current Outpatient Medications  Medication Sig Dispense Refill  . allopurinol (ZYLOPRIM) 100 MG tablet Take 2 tablets (200 mg total) by mouth daily. 60 tablet 5  . aspirin EC 81 MG tablet Take 1 tablet (81 mg total) by mouth daily. 90 tablet 3  . Cholecalciferol (VITAMIN D) 125 MCG (5000 UT) CAPS Take 5,000 Units by mouth daily.     . dapagliflozin propanediol (FARXIGA) 10 MG TABS tablet Take 1 tablet (10 mg total) by mouth daily before breakfast. 30 tablet 6  . divalproex (DEPAKOTE ER) 500 MG 24 hr tablet Take 1,500 mg by mouth at bedtime.    Marland Kitchen epoetin alfa (PROCRIT) 00174 UNIT/ML injection Inject 10,000 Units into the skin every 30 (thirty) days.     . ferrous sulfate 325 (65 FE) MG tablet Take 325 mg by mouth every Monday, Wednesday, and Friday.     Marland Kitchen gentamicin (GARAMYCIN) 0.3 % ophthalmic solution Place 1 drop into the left eye 3 (three) times daily.    . hydrALAZINE  (APRESOLINE) 25 MG tablet Take 1 tablet (25 mg total) by mouth 2 (two) times daily. 60 tablet 3  . inFLIXimab (REMICADE) 100 MG injection Infuse Remicade IV schedule 1 109m/kg every 8 weeks Premedicate with Tylenol 500-6546mby mouth and Benadryl 25-5042my mouth prior to infusion. Last PPD was on 12/2009.  1 each 6  . isosorbide mononitrate (IMDUR) 30 MG 24 hr tablet Take 1 tablet (30 mg total) by mouth daily. 90 tablet 3  . ivabradine (CORLANOR) 5 MG TABS  tablet Take 1 tablet (5 mg total) by mouth 2 (two) times daily with a meal. 180 tablet 3  . loperamide (IMODIUM) 2 MG capsule Take 1 capsule (2 mg total) by mouth 4 (four) times daily as needed for diarrhea or loose stools. 12 capsule 0  . magnesium oxide (MAG-OX) 400 MG tablet Take 2 tablets (800 mg total) by mouth in the morning, at noon, and at bedtime. 60 tablet 2  . metoprolol succinate (TOPROL-XL) 25 MG 24 hr tablet Take 1 tablet (25 mg total) by mouth at bedtime. 90 tablet 3  . OLANZapine (ZYPREXA) 7.5 MG tablet Take 1 tablet (7.5 mg total) by mouth at bedtime. 30 tablet 11  . omeprazole (PRILOSEC) 20 MG capsule Take 20 mg by mouth daily before breakfast.     . tamsulosin (FLOMAX) 0.4 MG CAPS capsule Take 1 capsule (0.4 mg total) by mouth daily. 30 capsule 2  . triamcinolone (NASACORT ALLERGY 24HR) 55 MCG/ACT AERO nasal inhaler Place 2 sprays into the nose daily as needed (allergies).    . vitamin B-12 (CYANOCOBALAMIN) 1000 MCG tablet Take 1,000 mcg by mouth daily.     No current facility-administered medications for this encounter.   BP 132/68 (BP Location: Left Arm, Patient Position: Sitting, Cuff Size: Normal)   Pulse 79   Ht 6' 4"  (1.93 m)   Wt 83.1 kg (183 lb 3.2 oz)   SpO2 (!) 69%   BMI 22.30 kg/m   Wt Readings from Last 3 Encounters:  11/14/19 83.1 kg (183 lb 3.2 oz)  11/09/19 82.7 kg (182 lb 6.4 oz)  11/02/19 81.6 kg (180 lb)    General:  Well appearing. No resp difficulty HEENT: normal Neck: supple. no JVD. Carotids  2+ bilat; no bruits. No lymphadenopathy or thryomegaly appreciated. Cor: PMI nondisplaced. Regular rate & rhythm. No rubs, gallops or murmurs. Lungs: clear Abdomen: soft, nontender, nondistended. No hepatosplenomegaly. No bruits or masses. Good bowel sounds. Extremities: no cyanosis, clubbing, rash, edema Neuro: alert & orientedx3, cranial nerves grossly intact. moves all 4 extremities w/o difficulty. Affect pleasant  EKG: Sr 75 bpm   Assessment/Plan: 1. Chronic systolic CHF: Nonischemic cardiomyoapthy.  Diagnosed by echo in 3/20, EF 30-35%.  LHC in 3/21 with no significant CAD. He has no chest pain.  Medication titration has been limited by orthostasis/low BP.  Echo in 8/20 showed EF 20-25% with mild LVH and normal RV function.  With resting sinus tachycardia, there was concerned for low output HF, but RHC in 8/20 and again in 3/21 showed normal cardiac output and filling pressures.  -NYHA III chronically - Continue current hydralazine/Imdur, no BP room to titrate.  - Continue Toprol XL 25 mg daily, take in evening before bed. - Continue ivabradine 5 mg bid. Heart rate controlled.  - Not taking Lasix.  - No ACEI/ARB/ARNI with low BP and elevated creatinine.  - Continue  Farxiga 10 mg daily.  - No spiro with ongoing hyperkalemia  -Set up repeat ECHO. Consider ICD placement (not CRT candidate with narrow QRS).  - He would be a difficult candidate for advanced therapies with deconditioning, renal dysfunction, and lives alone.  2. CKD: Stage 3b  Creatinine baseline 2-2.2.   Check BMET  3. COPD: Mild emphysema on prior chest CT.  PFTs in 9/20 showed mild obstruction and mild restriction.  High resolution CT chest 10/20 showed no ILD, +chronic bronchitis.  - Repeat CT in 10/21 to follow small lung nodule given smoking history.  4. Hyperkalemia  K runs high.  Check BMET today.    Set up ECHO. Follow up with Dr [name] in 3 months. Check BMET Discussed with HF Paramedicine during the visit.    Jmari Pelc NP-C  11/14/2019

## 2019-11-14 ENCOUNTER — Ambulatory Visit (HOSPITAL_COMMUNITY)
Admission: RE | Admit: 2019-11-14 | Discharge: 2019-11-14 | Disposition: A | Discharge status: Home / Self Care | Payer: Medicare Other | Source: Ambulatory Visit | Attending: Adult Health | Admitting: Adult Health

## 2019-11-14 ENCOUNTER — Other Ambulatory Visit: Payer: Self-pay

## 2019-11-14 ENCOUNTER — Other Ambulatory Visit (HOSPITAL_COMMUNITY): Payer: Self-pay

## 2019-11-14 ENCOUNTER — Encounter (HOSPITAL_COMMUNITY): Payer: Self-pay

## 2019-11-14 VITALS — BP 132/68 | HR 79 | Ht 76.0 in | Wt 183.2 lb

## 2019-11-14 DIAGNOSIS — K900 Celiac disease: Secondary | ICD-10-CM | POA: Insufficient documentation

## 2019-11-14 DIAGNOSIS — Z7984 Long term (current) use of oral hypoglycemic drugs: Secondary | ICD-10-CM | POA: Insufficient documentation

## 2019-11-14 DIAGNOSIS — Z87891 Personal history of nicotine dependence: Secondary | ICD-10-CM | POA: Insufficient documentation

## 2019-11-14 DIAGNOSIS — J449 Chronic obstructive pulmonary disease, unspecified: Secondary | ICD-10-CM

## 2019-11-14 DIAGNOSIS — Z7982 Long term (current) use of aspirin: Secondary | ICD-10-CM | POA: Diagnosis not present

## 2019-11-14 DIAGNOSIS — I130 Hypertensive heart and chronic kidney disease with heart failure and stage 1 through stage 4 chronic kidney disease, or unspecified chronic kidney disease: Secondary | ICD-10-CM | POA: Insufficient documentation

## 2019-11-14 DIAGNOSIS — K5090 Crohn's disease, unspecified, without complications: Secondary | ICD-10-CM | POA: Insufficient documentation

## 2019-11-14 DIAGNOSIS — N1832 Chronic kidney disease, stage 3b: Secondary | ICD-10-CM | POA: Diagnosis not present

## 2019-11-14 DIAGNOSIS — R0602 Shortness of breath: Secondary | ICD-10-CM | POA: Insufficient documentation

## 2019-11-14 DIAGNOSIS — E875 Hyperkalemia: Secondary | ICD-10-CM | POA: Diagnosis not present

## 2019-11-14 DIAGNOSIS — I5022 Chronic systolic (congestive) heart failure: Secondary | ICD-10-CM | POA: Diagnosis not present

## 2019-11-14 DIAGNOSIS — I10 Essential (primary) hypertension: Secondary | ICD-10-CM

## 2019-11-14 DIAGNOSIS — M109 Gout, unspecified: Secondary | ICD-10-CM | POA: Insufficient documentation

## 2019-11-14 DIAGNOSIS — I5042 Chronic combined systolic (congestive) and diastolic (congestive) heart failure: Secondary | ICD-10-CM

## 2019-11-14 DIAGNOSIS — N183 Chronic kidney disease, stage 3 unspecified: Secondary | ICD-10-CM | POA: Diagnosis not present

## 2019-11-14 DIAGNOSIS — Z79899 Other long term (current) drug therapy: Secondary | ICD-10-CM | POA: Insufficient documentation

## 2019-11-14 LAB — BASIC METABOLIC PANEL
Anion gap: 10 (ref 5–15)
BUN: 22 mg/dL (ref 8–23)
CO2: 22 mmol/L (ref 22–32)
Calcium: 8.7 mg/dL — ABNORMAL LOW (ref 8.9–10.3)
Chloride: 108 mmol/L (ref 98–111)
Creatinine, Ser: 2.22 mg/dL — ABNORMAL HIGH (ref 0.61–1.24)
GFR calc Af Amer: 33 mL/min — ABNORMAL LOW (ref >60)
GFR calc non Af Amer: 28 mL/min — ABNORMAL LOW (ref >60)
Glucose, Bld: 112 mg/dL — ABNORMAL HIGH (ref 70–99)
Potassium: 5.1 mmol/L (ref 3.5–5.1)
Sodium: 140 mmol/L (ref 135–145)

## 2019-11-14 NOTE — Progress Notes (Signed)
Paramedicine Encounter   Met with Marris in clinic today who was seen in by [facility]. No medications changed today, meds were verified and pill box filled accordingly. Repeat ECHO needs to be scheduled and I will follow up with same. Patient will see Dr. [name] in 3 months. I will follow up with patient in home next week. Clinic visit.    ACTION: Next visit planned for one week

## 2019-11-14 NOTE — Patient Instructions (Signed)
It was great to see you today! No medication changes are needed at this time.  Labs today We will only contact you if something comes back abnormal or we need to make some changes. Otherwise no news is good news!  Your physician has requested that you have an echocardiogram. Echocardiography is a painless test that uses sound waves to create images of your heart. It provides your doctor with information about the size and shape of your heart and how well your heart's chambers and valves are working. This procedure takes approximately one hour. There are no restrictions for this procedure.   Your physician recommends that you schedule a follow-up appointment in: 3 months with Dr [name]  If you have any questions or concerns before your next appointment please send [name] a message through [facility] or call our office at [phone].    TO LEAVE A MESSAGE FOR THE NURSE SELECT OPTION 2, PLEASE LEAVE A MESSAGE INCLUDING: . YOUR NAME . DATE OF BIRTH . CALL BACK NUMBER . REASON FOR CALL**this is important as we prioritize the call backs  YOU WILL RECEIVE A CALL BACK THE SAME DAY AS LONG AS YOU CALL BEFORE 4:00 PM

## 2019-11-17 ENCOUNTER — Other Ambulatory Visit: Payer: Self-pay

## 2019-11-17 ENCOUNTER — Ambulatory Visit (HOSPITAL_COMMUNITY)
Admission: RE | Admit: 2019-11-17 | Discharge: 2019-11-17 | Disposition: A | Discharge status: Home / Self Care | Payer: Medicare Other | Attending: Nephrology | Admitting: Nephrology

## 2019-11-17 DIAGNOSIS — N189 Chronic kidney disease, unspecified: Secondary | ICD-10-CM | POA: Insufficient documentation

## 2019-11-17 DIAGNOSIS — D631 Anemia in chronic kidney disease: Secondary | ICD-10-CM | POA: Diagnosis not present

## 2019-11-17 DIAGNOSIS — N183 Chronic kidney disease, stage 3 unspecified: Secondary | ICD-10-CM | POA: Diagnosis present

## 2019-11-17 LAB — FERRITIN: Ferritin: 816 ng/mL — ABNORMAL HIGH (ref 24–336)

## 2019-11-17 LAB — HEMOGLOBIN AND HEMATOCRIT, BLOOD
HCT: 37.2 % — ABNORMAL LOW (ref 39.0–52.0)
Hemoglobin: 11.6 g/dL — ABNORMAL LOW (ref 13.0–17.0)

## 2019-11-17 LAB — IRON AND TIBC
Iron: 47 ug/dL (ref 45–182)
Saturation Ratios: 19 % (ref 17.9–39.5)
TIBC: 250 ug/dL (ref 250–450)
UIBC: 203 ug/dL

## 2019-11-17 MED ORDER — EPOETIN ALFA 10000 UNIT/ML IJ SOLN
10000.0000 [IU] | INTRAMUSCULAR | Status: DC
  Administered 2019-11-17: 10000 [IU] via SUBCUTANEOUS
  Filled 2019-11-17: qty 1

## 2019-11-17 NOTE — Progress Notes (Signed)
PATIENT CARE CENTER NOTE  Diagnosis:Anemia associated with Chronic Renal Failure, anemia associated with renal disease   Provider:Patel, Ulice Dash MD   Procedure:Epoetin Alfa (Procrit) injection   Note:Patient received sub-qProcritinjection inleftarm. Tolerated well. Labs drawn pre-injection and Hemoglobin was11.6. Patient'sBP [id number].Discharge instructions given. Patient alert, oriented and ambulatoryat discharge.

## 2019-11-17 NOTE — Discharge Instructions (Signed)

## 2019-11-18 NOTE — Addendum Note (Signed)
Encounter addended by: Harvie Junior, CMA on: 11/18/2019 10:43 AM  Actions taken: Charge Capture section accepted

## 2019-11-23 ENCOUNTER — Other Ambulatory Visit (HOSPITAL_COMMUNITY): Payer: Self-pay

## 2019-11-23 NOTE — Progress Notes (Signed)
Paramedicine Encounter    Patient ID: Brent [name], male    DOB: [birth year], 74 y.o.   MRN: [id number]   Patient Care Team: Gayland Curry, DO as PCP - General (Geriatric Medicine) Martinique, Peter M, MD as PCP - Cardiology (Cardiology) Larey Dresser, MD as PCP - Advanced Heart Failure (Cardiology) Elmarie Shiley, MD (Nephrology) Milus Banister, MD (Gastroenterology)  Patient Active Problem List   Diagnosis Date Noted  . Decreased appetite 10/08/2019  . Weakness 10/08/2019  . Hyperkalemia 08/26/2019  . Chronic systolic CHF (congestive heart failure) ([place]) 08/26/2019  . Type 2 diabetes mellitus with stage 3 chronic kidney disease ([place]) 08/26/2019  . Chronic obstructive pulmonary disease ([place]) 06/20/2019  . Ventricular tachycardia ([place]) 06/20/2019  . Acute on chronic systolic (congestive) heart failure ([place]) 04/25/2019  . Metabolic acidosis 83/10/4074  . Orthostasis 09/07/2018  . AKI (acute kidney injury) ([place])   . Immunosuppressed status ([place])   . Macrocytic anemia   . Chronic combined systolic and diastolic congestive heart failure ([place]) 06/03/2018  . Candida esophagitis ([place]) 09/22/2017  . History of smoking 30 or more pack years 01/05/2017  . Bipolar 1 disorder ([place])   . BPH (benign prostatic hyperplasia) 03/31/2013  . 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 Essential hypertension, benign 03/31/2013  . Acute renal failure superimposed on stage 3 chronic kidney disease ([place]) 06/04/2012  . 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  allopurinol (ZYLOPRIM) 100 MG tablet, Take 2 tablets (200 mg total) by mouth daily., Disp: 60 tablet, Rfl: 5 .  aspirin EC 81 MG tablet, Take 1 tablet (81 mg total) by mouth daily., Disp: 90 tablet, Rfl: 3 .  Cholecalciferol (VITAMIN D) 125 MCG (5000 UT) CAPS, Take 5,000 Units by mouth daily. , Disp: , Rfl:  .  dapagliflozin propanediol (FARXIGA) 10 MG TABS tablet, Take 1 tablet (10 mg total) by mouth  daily before breakfast., Disp: 30 tablet, Rfl: 6 .  divalproex (DEPAKOTE ER) 500 MG 24 hr tablet, Take 1,500 mg by mouth at bedtime., Disp: , Rfl:  .  epoetin alfa (PROCRIT) 80881 UNIT/ML injection, Inject 10,000 Units into the skin every 30 (thirty) days. , Disp: , Rfl:  .  ferrous sulfate 325 (65 FE) MG tablet, Take 325 mg by mouth every Monday, Wednesday, and Friday. , Disp: , Rfl:  .  gentamicin (GARAMYCIN) 0.3 % ophthalmic solution, Place 1 drop into the left eye 3 (three) times daily., Disp: , Rfl:  .  hydrALAZINE (APRESOLINE) 25 MG tablet, Take 1 tablet (25 mg total) by mouth 2 (two) times daily., Disp: 60 tablet, Rfl: 3 .  inFLIXimab (REMICADE) 100 MG injection, Infuse Remicade IV schedule 1 32m/kg every 8 weeks Premedicate with Tylenol 500-6523mby mouth and Benadryl 25-501my mouth prior to infusion. Last PPD was on 12/2009. , Disp: 1 each, Rfl: 6 .  isosorbide mononitrate (IMDUR) 30 MG 24 hr tablet, Take 1 tablet (30 mg total) by mouth daily., Disp: 90 tablet, Rfl: 3 .  ivabradine (CORLANOR) 5 MG TABS tablet, Take 1 tablet (5 mg total) by mouth 2 (two) times daily with a meal., Disp: 180 tablet, Rfl: 3 .  loperamide (IMODIUM) 2 MG capsule, Take 1 capsule (2 mg total) by mouth 4 (four) times daily as needed for diarrhea or loose stools., Disp: 12 capsule, Rfl: 0 .  magnesium oxide (MAG-OX) 400 MG tablet, Take 2 tablets (800 mg total) by mouth in the morning, at noon,  and at bedtime., Disp: 60 tablet, Rfl: 2 .  metoprolol succinate (TOPROL-XL) 25 MG 24 hr tablet, Take 1 tablet (25 mg total) by mouth at bedtime., Disp: 90 tablet, Rfl: 3 .  OLANZapine (ZYPREXA) 7.5 MG tablet, Take 1 tablet (7.5 mg total) by mouth at bedtime., Disp: 30 tablet, Rfl: 11 .  omeprazole (PRILOSEC) 20 MG capsule, Take 20 mg by mouth daily before breakfast. , Disp: , Rfl:  .  tamsulosin (FLOMAX) 0.4 MG CAPS capsule, Take 1 capsule (0.4 mg total) by mouth daily., Disp: 30 capsule, Rfl: 2 .  triamcinolone (NASACORT  ALLERGY 24HR) 55 MCG/ACT AERO nasal inhaler, Place 2 sprays into the nose daily as needed (allergies)., Disp: , Rfl:  .  vitamin B-12 (CYANOCOBALAMIN) 1000 MCG tablet, Take 1,000 mcg by mouth daily., Disp: , Rfl:  Allergies  Allergen Reactions  . Azathioprine Other (See Comments)    REACTION: affected WBC "Almost died"  . Ciprofloxacin Other (See Comments)    Unknown rxn  . Levaquin [Levofloxacin In D5w] Other (See Comments)    Unknown rxn  . Plendil [Felodipine] Other (See Comments)    Unknown rxn     Social History   Socioeconomic History  . Marital status: Divorced    Spouse name: Not on file  . Number of children: 1  . Years of education: 3  . Highest education level: Not on file  Occupational History  . Occupation: retired  . Occupation: Veteran  Tobacco Use  . Smoking status: Former Smoker    Packs/day: 1.00    Years: 49.00    Pack years: 49.00    Types: Cigarettes    Start date: 04/11/1956    Quit date: 04/17/2014    Years since quitting: 5.6  . Smokeless tobacco: Never Used  Vaping Use  . Vaping Use: Never used  Substance and Sexual Activity  . Alcohol use: No    Alcohol/week: 0.0 standard drinks  . Drug use: No  . Sexual activity: Never  Other Topics Concern  . Not on file  Social History Narrative  . Not on file   Social Determinants of Health   Financial Resource Strain:   . Difficulty of Paying Living Expenses: Not on file  Food Insecurity:   . Worried About Charity fundraiser in the Last Year: Not on file  . Ran Out of Food in the Last Year: Not on file  Transportation Needs:   . Lack of Transportation (Medical): Not on file  . Lack of Transportation (Non-Medical): Not on file  Physical Activity:   . Days of Exercise per Week: Not on file  . Minutes of Exercise per Session: Not on file  Stress:   . Feeling of Stress : Not on file  Social Connections:   . Frequency of Communication with Friends and Family: Not on file  . Frequency of Social  Gatherings with Friends and Family: Not on file  . Attends Religious Services: Not on file  . Active Member of Clubs or Organizations: Not on file  . Attends Archivist Meetings: Not on file  . Marital Status: Not on file  Intimate Partner Violence:   . Fear of Current or Ex-Partner: Not on file  . Emotionally Abused: Not on file  . Physically Abused: Not on file  . Sexually Abused: Not on file    Physical Exam Vitals reviewed.  Constitutional:      Appearance: He is normal weight.  HENT:     Head: Normocephalic.  Nose: Nose normal.     Mouth/Throat:     Mouth: Mucous membranes are moist.     Pharynx: Oropharynx is clear.  Eyes:     Pupils: Pupils are equal, round, and reactive to light.  Cardiovascular:     Rate and Rhythm: Normal rate and regular rhythm.     Pulses: Normal pulses.     Heart sounds: Normal heart sounds.  Pulmonary:     Effort: Pulmonary effort is normal.     Breath sounds: Normal breath sounds.  Abdominal:     General: Abdomen is flat.     Palpations: Abdomen is soft.  Musculoskeletal:        General: Normal range of motion.     Right lower leg: Edema present.     Left lower leg: Edema present.  Skin:    General: Skin is warm and dry.     Capillary Refill: Capillary refill takes less than 2 seconds.  Neurological:     Mental Status: He is alert. Mental status is at baseline.  Psychiatric:        Mood and Affect: Mood normal.     Arrived for home visit for Jimmy who was alert and oriented seated in his recliner reporting he feels his BP is high and he has had some trouble breathing while walking a lot. Laverna Peace has been compliant with his medications over the last week. Vitals were obtained. I gave Laverna Peace his morning meds today. Medications were confirmed and verified. Pill box filled accordingly. Laverna Peace was reminded of upcoming appointments. Jimmy reported eating salty fattening foods lately and I educated him on the importance of keeping his  diet heart healthy. He verbalized agreement. Home visit complete. I will see Laverna Peace in one week.   Refills- NONE    Future Appointments  Date Time Provider [place]  11/24/2019  9:00 AM [facility] ECHO OP 1 [facility]  12/16/2019  9:00 AM [facility] RM 1 [facility] None  01/24/2020  2:15 PM [name], [name], NP [facility] None  02/23/2020 11:00 AM Mariea Clonts, Tiffany L, DO [facility] None  02/27/2020  1:40 PM Larey Dresser, MD [facility] None     ACTION: Home visit completed Next visit planned for one week

## 2019-11-24 ENCOUNTER — Other Ambulatory Visit: Payer: Self-pay

## 2019-11-24 ENCOUNTER — Ambulatory Visit (HOSPITAL_COMMUNITY): Payer: Medicare Other

## 2019-11-24 ENCOUNTER — Ambulatory Visit (HOSPITAL_COMMUNITY)
Admission: RE | Admit: 2019-11-24 | Discharge: 2019-11-24 | Disposition: A | Discharge status: Home / Self Care | Payer: Medicare Other | Source: Ambulatory Visit | Attending: Adult Health | Admitting: Adult Health

## 2019-11-24 DIAGNOSIS — I110 Hypertensive heart disease with heart failure: Secondary | ICD-10-CM | POA: Diagnosis not present

## 2019-11-24 DIAGNOSIS — Z720 Tobacco use: Secondary | ICD-10-CM | POA: Insufficient documentation

## 2019-11-24 DIAGNOSIS — I5042 Chronic combined systolic (congestive) and diastolic (congestive) heart failure: Secondary | ICD-10-CM | POA: Diagnosis not present

## 2019-11-24 LAB — ECHOCARDIOGRAM COMPLETE: S' Lateral: 4.8 cm

## 2019-11-24 NOTE — Progress Notes (Signed)
  Echocardiogram 2D Echocardiogram has been performed.  Jannett Celestine 11/24/2019, 9:54 AM

## 2019-11-29 ENCOUNTER — Telehealth (HOSPITAL_COMMUNITY): Payer: Self-pay | Admitting: Cardiology

## 2019-11-29 DIAGNOSIS — I5042 Chronic combined systolic (congestive) and diastolic (congestive) heart failure: Secondary | ICD-10-CM

## 2019-11-29 NOTE — Telephone Encounter (Signed)
-----   Message from Conrad Paoli, NP sent at 11/29/2019 12:09 PM EDT ----- EF remains reduced 25%. Please refer to EP for ICD.

## 2019-11-29 NOTE — Telephone Encounter (Signed)
Pt aware and voiced understanding Order placed

## 2019-11-30 ENCOUNTER — Other Ambulatory Visit (HOSPITAL_COMMUNITY): Payer: Self-pay

## 2019-11-30 NOTE — Progress Notes (Signed)
Paramedicine Encounter    Patient ID: RHEA THRUN, male    DOB: 01-May-1945, 73 y.o.   MRN: [id number]   Patient Care Team: Gayland Curry, DO as PCP - General (Geriatric Medicine) [place], Peter M, MD as PCP - Cardiology (Cardiology) Larey Dresser, MD as PCP - Advanced Heart Failure (Cardiology) Elmarie Shiley, MD (Nephrology) Milus Banister, MD (Gastroenterology)  Patient Active Problem List   Diagnosis Date Noted  . Decreased appetite 10/08/2019  . Weakness 10/08/2019  . Hyperkalemia 08/26/2019  . Chronic systolic CHF (congestive heart failure) ([place]) 08/26/2019  . Type 2 diabetes mellitus with stage 3 chronic kidney disease ([place]) 08/26/2019  . Chronic obstructive pulmonary disease ([place]) 06/20/2019  . Ventricular tachycardia ([place]) 06/20/2019  . Acute on chronic systolic (congestive) heart failure ([place]) 04/25/2019  . Metabolic acidosis 17/51/0258  . Orthostasis 09/07/2018  . AKI (acute kidney injury) ([place])   . Immunosuppressed status ([place])   . Macrocytic anemia   . Chronic combined systolic and diastolic congestive heart failure ([place]) 06/03/2018  . Candida esophagitis ([place]) 09/22/2017  . History of smoking 30 or more pack years 01/05/2017  . Bipolar 1 disorder ([place])   . BPH (benign prostatic hyperplasia) 03/31/2013  . 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 Essential hypertension, benign 03/31/2013  . Acute renal failure superimposed on stage 3 chronic kidney disease ([place]) 06/04/2012  . 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[place]) 01/23/2010    Current Outpatient Medications:  .  allopurinol (ZYLOPRIM) 100 MG tablet, Take 2 tablets (200 mg total) by mouth daily., Disp: 60 tablet, Rfl: 5 .  aspirin EC 81 MG tablet, Take 1 tablet (81 mg total) by mouth daily., Disp: 90 tablet, Rfl: 3 .  Cholecalciferol (VITAMIN D) 125 MCG (5000 UT) CAPS, Take 5,000 Units by mouth daily. , Disp: , Rfl:  .  dapagliflozin propanediol (FARXIGA) 10 MG TABS tablet, Take 1 tablet (10 mg total) by mouth  daily before breakfast., Disp: 30 tablet, Rfl: 6 .  divalproex (DEPAKOTE ER) 500 MG 24 hr tablet, Take 1,500 mg by mouth at bedtime., Disp: , Rfl:  .  epoetin alfa (PROCRIT) 52778 UNIT/ML injection, Inject 10,000 Units into the skin every 30 (thirty) days. , Disp: , Rfl:  .  ferrous sulfate 325 (65 FE) MG tablet, Take 325 mg by mouth every Monday, Wednesday, and Friday. , Disp: , Rfl:  .  gentamicin (GARAMYCIN) 0.3 % ophthalmic solution, Place 1 drop into the left eye 3 (three) times daily., Disp: , Rfl:  .  hydrALAZINE (APRESOLINE) 25 MG tablet, Take 1 tablet (25 mg total) by mouth 2 (two) times daily., Disp: 60 tablet, Rfl: 3 .  inFLIXimab (REMICADE) 100 MG injection, Infuse Remicade IV schedule 1 37m/kg every 8 weeks Premedicate with Tylenol 500-65104mby mouth and Benadryl 25-5056my mouth prior to infusion. Last PPD was on 12/2009. , Disp: 1 each, Rfl: 6 .  isosorbide mononitrate (IMDUR) 30 MG 24 hr tablet, Take 1 tablet (30 mg total) by mouth daily., Disp: 90 tablet, Rfl: 3 .  ivabradine (CORLANOR) 5 MG TABS tablet, Take 1 tablet (5 mg total) by mouth 2 (two) times daily with a meal., Disp: 180 tablet, Rfl: 3 .  loperamide (IMODIUM) 2 MG capsule, Take 1 capsule (2 mg total) by mouth 4 (four) times daily as needed for diarrhea or loose stools., Disp: 12 capsule, Rfl: 0 .  magnesium oxide (MAG-OX) 400 MG tablet, Take 2 tablets (800 mg total) by mouth in the morning, at noon,  and at bedtime., Disp: 60 tablet, Rfl: 2 .  metoprolol succinate (TOPROL-XL) 25 MG 24 hr tablet, Take 1 tablet (25 mg total) by mouth at bedtime., Disp: 90 tablet, Rfl: 3 .  OLANZapine (ZYPREXA) 7.5 MG tablet, Take 1 tablet (7.5 mg total) by mouth at bedtime., Disp: 30 tablet, Rfl: 11 .  omeprazole (PRILOSEC) 20 MG capsule, Take 20 mg by mouth daily before breakfast. , Disp: , Rfl:  .  tamsulosin (FLOMAX) 0.4 MG CAPS capsule, Take 1 capsule (0.4 mg total) by mouth daily., Disp: 30 capsule, Rfl: 2 .  triamcinolone (NASACORT  ALLERGY 24HR) 55 MCG/ACT AERO nasal inhaler, Place 2 sprays into the nose daily as needed (allergies)., Disp: , Rfl:  .  vitamin B-12 (CYANOCOBALAMIN) 1000 MCG tablet, Take 1,000 mcg by mouth daily., Disp: , Rfl:  Allergies  Allergen Reactions  . Azathioprine Other (See Comments)    REACTION: affected WBC "Almost died"  . Ciprofloxacin Other (See Comments)    Unknown rxn  . Levaquin [Levofloxacin In D5w] Other (See Comments)    Unknown rxn  . Plendil [Felodipine] Other (See Comments)    Unknown rxn     Social History   Socioeconomic History  . Marital status: Divorced    Spouse name: Not on file  . Number of children: 1  . Years of education: 22  . Highest education level: Not on file  Occupational History  . Occupation: retired  . Occupation: Veteran  Tobacco Use  . Smoking status: Former Smoker    Packs/day: 1.00    Years: 49.00    Pack years: 49.00    Types: Cigarettes    Start date: 04/11/1956    Quit date: 04/17/2014    Years since quitting: 5.6  . Smokeless tobacco: Never Used  Vaping Use  . Vaping Use: Never used  Substance and Sexual Activity  . Alcohol use: No    Alcohol/week: 0.0 standard drinks  . Drug use: No  . Sexual activity: Never  Other Topics Concern  . Not on file  Social History Narrative  . Not on file   Social Determinants of Health   Financial Resource Strain:   . Difficulty of Paying Living Expenses: Not on file  Food Insecurity:   . Worried About Charity fundraiser in the Last Year: Not on file  . Ran Out of Food in the Last Year: Not on file  Transportation Needs:   . Lack of Transportation (Medical): Not on file  . Lack of Transportation (Non-Medical): Not on file  Physical Activity:   . Days of Exercise per Week: Not on file  . Minutes of Exercise per Session: Not on file  Stress:   . Feeling of Stress : Not on file  Social Connections:   . Frequency of Communication with Friends and Family: Not on file  . Frequency of Social  Gatherings with Friends and Family: Not on file  . Attends Religious Services: Not on file  . Active Member of Clubs or Organizations: Not on file  . Attends Archivist Meetings: Not on file  . Marital Status: Not on file  Intimate Partner Violence:   . Fear of Current or Ex-Partner: Not on file  . Emotionally Abused: Not on file  . Physically Abused: Not on file  . Sexually Abused: Not on file    Physical Exam Vitals reviewed.  Constitutional:      Appearance: He is normal weight.  HENT:     Head: Normocephalic.  Nose: Nose normal.     Mouth/Throat:     Mouth: Mucous membranes are moist.  Eyes:     Pupils: Pupils are equal, round, and reactive to light.  Cardiovascular:     Rate and Rhythm: Normal rate and regular rhythm.     Pulses: Normal pulses.     Heart sounds: Normal heart sounds.  Pulmonary:     Effort: Pulmonary effort is normal.     Breath sounds: Normal breath sounds.  Abdominal:     General: Abdomen is flat.     Palpations: Abdomen is soft.  Musculoskeletal:        General: Normal range of motion.     Cervical back: Normal range of motion.     Right lower leg: No edema.     Left lower leg: No edema.  Skin:    General: Skin is warm and dry.     Capillary Refill: Capillary refill takes less than 2 seconds.  Neurological:     Mental Status: He is alert. Mental status is at baseline.  Psychiatric:        Mood and Affect: Mood normal.     Arrived for home visit for Jimmy who was alert and oriented reporting he was feeling good but had some dizziness when he wakes up. Vitals were obtained and are as noted. EKO recording obtained. Some edema noted in right foot. Laverna Peace has been mostly compliant with his medications over the last week. Jimmy reminded of upcoming appointments. Medications reviewed and confirmed. Pill box filled accordingly. Home visit complete.   Refills- NONE     Future Appointments  Date Time Provider [place]   12/16/2019  9:00 AM [facility] RM 1 [facility] None  01/24/2020  2:15 PM [name], [name], NP [facility] None  02/23/2020 11:00 AM Hollace Kinnier L, DO [facility] None  02/27/2020  1:40 PM Larey Dresser, MD [facility] None     ACTION: Home visit completed Next visit planned for one week

## 2019-12-01 ENCOUNTER — Telehealth (HOSPITAL_COMMUNITY): Payer: Self-pay

## 2019-12-01 ENCOUNTER — Telehealth: Payer: Self-pay | Admitting: *Deleted

## 2019-12-01 NOTE — Telephone Encounter (Signed)
[email or web address]  That's what I gave him before.  He had said he also got something from his nephrologist

## 2019-12-01 NOTE — Telephone Encounter (Signed)
Patient called and stated that you had printed out a diet for him and he has lost it. Patient is requesting another one to be mailed to him. Stated that it was for his potassium.  Please Advise.

## 2019-12-01 NOTE — Telephone Encounter (Signed)
Take Lasix 20 mg daily x 3 days then can use prn.  Needs to cut back on Na in diet.

## 2019-12-01 NOTE — Telephone Encounter (Signed)
Minette Brine, RN with [facility] called to report that the patient has complianed about bilateral feet swelling for the last few days. She also reports that his weight has been stable today it was 179 and yesterday his weight was 180. She also reports that his has been constantly eating TV dinners daily.   Please advise

## 2019-12-01 NOTE — Telephone Encounter (Signed)
Patient advised and verbalized understanding 

## 2019-12-02 ENCOUNTER — Telehealth: Payer: Self-pay | Admitting: General Practice

## 2019-12-02 NOTE — Telephone Encounter (Signed)
Patient returned call to [facility]. Transferred to her.

## 2019-12-02 NOTE — Telephone Encounter (Signed)
Information Printed and mailed to patient.

## 2019-12-05 ENCOUNTER — Ambulatory Visit (INDEPENDENT_AMBULATORY_CARE_PROVIDER_SITE_OTHER): Payer: Medicare Other | Admitting: Nurse Practitioner

## 2019-12-05 ENCOUNTER — Encounter: Payer: Self-pay | Admitting: Nurse Practitioner

## 2019-12-05 ENCOUNTER — Other Ambulatory Visit: Payer: Self-pay

## 2019-12-05 VITALS — BP 100/70 | HR 84 | Temp 97.5°F | Resp 20 | Ht 76.0 in | Wt 180.6 lb

## 2019-12-05 DIAGNOSIS — I5022 Chronic systolic (congestive) heart failure: Secondary | ICD-10-CM

## 2019-12-05 DIAGNOSIS — R600 Localized edema: Secondary | ICD-10-CM

## 2019-12-05 NOTE — Progress Notes (Signed)
Careteam: Patient Care Team: Gayland Curry, DO as PCP - General (Geriatric Medicine) Martinique, Peter M, MD as PCP - Cardiology (Cardiology) Larey Dresser, MD as PCP - Advanced Heart Failure (Cardiology) Elmarie Shiley, MD (Nephrology) Milus Banister, MD (Gastroenterology)  PLACE OF SERVICE:  [place] Directive information Does Patient Have a Medical Advance Directive?: Yes, Type of Advance Directive: Out of facility DNR (pink MOST or yellow form), Pre-existing out of facility DNR order (yellow form or pink MOST form): Pink MOST form placed in chart (order not valid for inpatient use), Does patient want to make changes to medical advance directive?: No - Patient declined  Allergies  Allergen Reactions  . Azathioprine Other (See Comments)    REACTION: affected WBC "Almost died"  . Ciprofloxacin Other (See Comments)    Unknown rxn  . Levaquin [Levofloxacin In D5w] Other (See Comments)    Unknown rxn  . Plendil [Felodipine] Other (See Comments)    Unknown rxn    Chief Complaint  Patient presents with  . Acute Visit    Swelling in both feet and the right is worse than the left     HPI: Patient is a 74 y.o. male due to swelling in feet.  Reports swelling in feet for like 5 days with some shortness of breath Reports he thinks he is eating the wrong things.  He is eating TV dinners for all his meals. Loves pizza.  He is more short of breath and has been for the past 5 days. Reports he is more short of breath with activity.  Not getting any worse. He is able to sleep flat at night without worsening. He is currently taking lasix 20 mg daily.  No chest pains.  Reports he recently had echo and EF is now down to 25%  Weight changes at home daily  Reports he is hearing someone knock on the door and no one is there He will be sitting in the living room and he will hear someone slam the door.  States he is waking up screaming at night due to nightmares.  Has had this  before and plans to see a psychiatrist.   Reports blood pressure goes up and down was 150/70 when he was at home. Then it will be lower and he gets dizzy.   Review of Systems:  Review of Systems  Constitutional: Negative for chills, fever and weight loss.  HENT: Negative for tinnitus.   Respiratory: Negative for cough, sputum production and shortness of breath.   Cardiovascular: Positive for leg swelling. Negative for chest pain and palpitations.  Gastrointestinal: Negative for abdominal pain, constipation, diarrhea and heartburn.  Genitourinary: Negative for dysuria, frequency and urgency.  Musculoskeletal: Negative for back pain, falls, joint pain and myalgias.  Skin: Negative.   Neurological: Negative for dizziness and headaches.  Psychiatric/Behavioral: Negative for depression and memory loss. The patient does not have insomnia.     Past Medical History:  Diagnosis Date  . Anemia   . Anxiety   . Benign paroxysmal positional vertigo   . Bipolar I disorder, most recent episode (or current) unspecified   . Celiac disease   . CHF (congestive heart failure) ([place])   . Chronic kidney disease, stage III (moderate) (HCC)   . Crohn's    Remicade q8 weeks  . Depression   . Essential and other specified forms of tremor    medication-induced Parkinson's, now resolved  . Essential hypertension, benign   . Gout  2018  . Heart failure ([place])   . Hypertrophy of prostate without urinary obstruction and other lower urinary tract symptoms (LUTS)   . Impotence of organic origin   . Insomnia with sleep apnea, unspecified   . Iron deficiency anemia, unspecified   . 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 Neuralgia, neuritis, and radiculitis, unspecified   . Other B-complex deficiencies   . Other extrapyramidal disease and abnormal movement disorder   . Postinflammatory pulmonary fibrosis ([place])   . Tobacco use disorder   . Vertigo 2018   Past Surgical History:  Procedure Laterality Date  . CATARACT  EXTRACTION, BILATERAL Bilateral 09/2018  . CHOLECYSTECTOMY  07-12-2010  . RIGHT HEART CATH N/A 10/12/2018   Procedure: RIGHT HEART CATH;  Surgeon: Jolaine Artist, MD;  Location: [facility];  Service: Cardiovascular;  Laterality: N/A;  . RIGHT/LEFT HEART CATH AND CORONARY ANGIOGRAPHY N/A 04/26/2019   Procedure: RIGHT/LEFT HEART CATH AND CORONARY ANGIOGRAPHY;  Surgeon: Larey Dresser, MD;  Location: [facility];  Service: Cardiovascular;  Laterality: N/A;  . SMALL INTESTINE SURGERY     x 2   Social History:   reports that he quit smoking about 5 years ago. His smoking use included cigarettes. He started smoking about 63 years ago. He has a 49.00 pack-year smoking history. He has never used smokeless tobacco. He reports that he does not drink alcohol and does not use drugs.  Family History  Problem Relation Age of Onset  . Diabetes Mother        maternal grandmother  . Uterine cancer Mother   . Emphysema Father   . Pneumonia Maternal Grandmother   . Colon cancer Neg Hx     Medications: Patient's Medications  New Prescriptions   No medications on file  Previous Medications   ALLOPURINOL (ZYLOPRIM) 100 MG TABLET    Take 2 tablets (200 mg total) by mouth daily.   ASPIRIN EC 81 MG TABLET    Take 1 tablet (81 mg total) by mouth daily.   CHOLECALCIFEROL (VITAMIN D) 125 MCG (5000 UT) CAPS    Take 5,000 Units by mouth daily.    DAPAGLIFLOZIN PROPANEDIOL (FARXIGA) 10 MG TABS TABLET    Take 1 tablet (10 mg total) by mouth daily before breakfast.   DIVALPROEX (DEPAKOTE ER) 500 MG 24 HR TABLET    Take 1,500 mg by mouth at bedtime.   EPOETIN ALFA (PROCRIT) 22633 UNIT/ML INJECTION    Inject 10,000 Units into the skin every 30 (thirty) days.    FERROUS SULFATE 325 (65 FE) MG TABLET    Take 325 mg by mouth every Monday, Wednesday, and Friday.    FUROSEMIDE (LASIX) 20 MG TABLET    Take 20 mg by mouth every Monday, Wednesday, and Friday.   GENTAMICIN (GARAMYCIN) 0.3 % OPHTHALMIC  SOLUTION    Place 1 drop into the left eye 3 (three) times daily.   HYDRALAZINE (APRESOLINE) 25 MG TABLET    Take 1 tablet (25 mg total) by mouth 2 (two) times daily.   INFLIXIMAB (REMICADE) 100 MG INJECTION    Infuse Remicade IV schedule 1 29m/kg every 8 weeks Premedicate with Tylenol 500-6577mby mouth and Benadryl 25-508my mouth prior to infusion. Last PPD was on 12/2009.    ISOSORBIDE MONONITRATE (IMDUR) 30 MG 24 HR TABLET    Take 1 tablet (30 mg total) by mouth daily.   IVABRADINE (CORLANOR) 5 MG TABS TABLET    Take 1 tablet (5 mg total) by mouth 2 (  two) times daily with a meal.   LOPERAMIDE (IMODIUM) 2 MG CAPSULE    Take 1 capsule (2 mg total) by mouth 4 (four) times daily as needed for diarrhea or loose stools.   MAGNESIUM OXIDE (MAG-OX) 400 MG TABLET    Take 2 tablets (800 mg total) by mouth in the morning, at noon, and at bedtime.   METOPROLOL SUCCINATE (TOPROL-XL) 25 MG 24 HR TABLET    Take 1 tablet (25 mg total) by mouth at bedtime.   OLANZAPINE (ZYPREXA) 7.5 MG TABLET    Take 1 tablet (7.5 mg total) by mouth at bedtime.   OMEPRAZOLE (PRILOSEC) 20 MG CAPSULE    Take 20 mg by mouth daily before breakfast.    TAMSULOSIN (FLOMAX) 0.4 MG CAPS CAPSULE    Take 1 capsule (0.4 mg total) by mouth daily.   TRIAMCINOLONE (NASACORT ALLERGY 24HR) 55 MCG/ACT AERO NASAL INHALER    Place 2 sprays into the nose daily as needed (allergies).   VITAMIN B-12 (CYANOCOBALAMIN) 1000 MCG TABLET    Take 1,000 mcg by mouth daily.  Modified Medications   No medications on file  Discontinued Medications   No medications on file    Physical Exam:  Vitals:   12/05/19 1519  BP: 100/70  Pulse: 84  Resp: 20  Temp: (!) 97.5 F (36.4 C)  TempSrc: Temporal  SpO2: 96%  Weight: 180 lb 9.6 oz (81.9 kg)  Height: 6' 4"  (1.93 m)   Body mass index is 21.98 kg/m. Wt Readings from Last 3 Encounters:  12/05/19 180 lb 9.6 oz (81.9 kg)  11/30/19 180 lb (81.6 kg)  11/23/19 177 lb (80.3 kg)    Physical  Exam Constitutional:      General: He is not in acute distress.    Appearance: He is well-developed. He is not diaphoretic.  HENT:     Head: Normocephalic and atraumatic.     Mouth/Throat:     Pharynx: No oropharyngeal exudate.  Eyes:     Conjunctiva/sclera: Conjunctivae normal.     Pupils: Pupils are equal, round, and reactive to light.  Cardiovascular:     Rate and Rhythm: Normal rate and regular rhythm.     Heart sounds: Normal heart sounds.  Pulmonary:     Effort: Pulmonary effort is normal.     Breath sounds: Normal breath sounds.  Abdominal:     General: Bowel sounds are normal.     Palpations: Abdomen is soft.  Musculoskeletal:        General: No tenderness.     Cervical back: Normal range of motion and neck supple.     Right lower leg: Edema (1+ pitting) present.     Left lower leg: Edema (1+pitting) present.  Skin:    General: Skin is warm and dry.  Neurological:     Mental Status: He is alert and oriented to person, place, and time.     Labs reviewed: Basic Metabolic Panel: Recent Labs    08/27/19 0201 08/27/19 0201 08/28/19 0323 08/28/19 0323 09/06/19 1559 09/26/19 1416 09/28/19 0946 10/20/19 1158 11/14/19 1413  NA 140   < > 138   < > 141   < > 141 142 140  K 5.5*   < > 5.3*   < > 5.3   < > 5.2 5.7* 5.1  CL 107   < > 103   < > 111*   < > 114* 112* 108  CO2 25   < > 27   < >  22   < > 20 27 22   GLUCOSE 127*   < > 97   < > 83   < > 102* 78 112*  BUN 36*   < > 34*   < > 31*   < > 42* 27* 22  CREATININE 2.52*   < > 2.57*  --  2.16*   < > 2.55* 2.08* 2.22*  CALCIUM 8.4*   < > 8.6*   < > 8.8   < > 8.9 9.2 8.7*  MG 1.8  --  1.6*  --  1.8  --   --   --   --   PHOS 4.4  --  3.9  --   --   --   --   --   --   TSH  --   --   --   --   --   --  1.22 2.46  --    < > = values in this interval not displayed.   Liver Function Tests: Recent Labs    08/27/19 0201 08/27/19 0201 08/28/19 0323 08/28/19 0323 09/06/19 1559 09/28/19 0946 10/20/19 1158  AST 11*    < > 11*   < > 11 8* 11  ALT 9   < > 10   < > 7* 5* 8*  ALKPHOS 56  --  57  --   --   --   --   BILITOT 0.4   < > 0.4   < > 0.4 0.3 0.3  PROT 5.6*   < > 6.2*   < > 6.4 6.7 6.7  ALBUMIN 2.8*  --  3.0*  --   --   --   --    < > = values in this interval not displayed.   No results for input(s): LIPASE, AMYLASE in the last 8760 hours. No results for input(s): AMMONIA in the last 8760 hours. CBC: Recent Labs    09/06/19 1559 09/16/19 0959 09/28/19 0946 09/28/19 0946 10/17/19 1043 10/20/19 1158 11/17/19 1020  WBC 9.0  --  10.7  --   --  8.1  --   NEUTROABS 4,815  --  7,586  --   --  4,820  --   HGB 10.3*   < > 11.3*   < > 10.8* 11.7* 11.6*  HCT 31.2*   < > 34.3*   < > 36.2* 36.5* 37.2*  MCV 101.3*  --  100.6*  --   --  102.2*  --   PLT 160  --  148  --   --  136*  --    < > = values in this interval not displayed.   Lipid Panel: Recent Labs    04/19/19 1041 10/20/19 1158  CHOL 140 161  HDL 54 54  LDLCALC 64 86  TRIG 110 112  CHOLHDL 2.6 3.0   TSH: Recent Labs    09/28/19 0946 10/20/19 1158  TSH 1.22 2.46   A1C: Lab Results  Component Value Date   HGBA1C 5.1 08/27/2019   ECHOCARDIOGRAM COMPLETE  Result Date: 11/24/2019    ECHOCARDIOGRAM REPORT   Patient Name:   TREVAUGHN SCHEAR Benavides Date of Exam: 11/24/2019 Medical Rec #:  [id number]    Height:       76.0 in Accession #:    [id number]   Weight:       177.0 lb Date of Birth:  [birth year]    BSA:          2.103 m  Patient Age:    79 years     BP:           150/68 mmHg Patient Gender: M            HR:           68 bpm. Exam Location:  Outpatient Procedure: 2D Echo Indications:    CHF 428  History:        Patient has prior history of Echocardiogram examinations, most                 recent 09/29/2018. CHF; Risk Factors:Hypertension. Tobacco use                 disorder.  Sonographer:    Jannett Celestine RDCS (AE) Referring Phys: [phone] AMY D CLEGG IMPRESSIONS  1. Left ventricular ejection fraction, by estimation, is 25 to 30%. Left  ventricular ejection fraction by PLAX is 24 %. The left ventricle has severely decreased function. The left ventricle demonstrates global hypokinesis. Indeterminate diastolic filling due to E-A fusion.  2. Right ventricular systolic function is normal. The right ventricular size is normal.  3. The mitral valve is normal in structure. No evidence of mitral valve regurgitation.  4. The aortic valve was not well visualized. Aortic valve regurgitation is not visualized. No aortic stenosis is present. Comparison(s): No significant change from prior study. FINDINGS  Left Ventricle: Left ventricular ejection fraction, by estimation, is 25 to 30%. Left ventricular ejection fraction by PLAX is 24 %. The left ventricle has severely decreased function. The left ventricle demonstrates global hypokinesis. The left ventricular internal cavity size was normal in size. There is no left ventricular hypertrophy. Indeterminate diastolic filling due to E-A fusion.  LV Wall Scoring: The apical lateral segment is akinetic. The entire anterior wall, antero-lateral wall, entire septum, entire inferior wall, posterior wall, and apex are hypokinetic. Right Ventricle: The right ventricular size is normal. No increase in right ventricular wall thickness. Right ventricular systolic function is normal. Left Atrium: Left atrial size was normal in size. Right Atrium: Right atrial size was normal in size. Pericardium: There is no evidence of pericardial effusion. Mitral Valve: The mitral valve is normal in structure. No evidence of mitral valve regurgitation. Tricuspid Valve: The tricuspid valve is normal in structure. Tricuspid valve regurgitation is not demonstrated. Aortic Valve: The aortic valve was not well visualized. Aortic valve regurgitation is not visualized. No aortic stenosis is present. Pulmonic Valve: The pulmonic valve was not well visualized. Pulmonic valve regurgitation is not visualized. Aorta: The aortic root was not well  visualized. IAS/Shunts: The interatrial septum was not assessed.  LEFT VENTRICLE PLAX 2D LV EF:         Left            Diastology                ventricular     LV e' medial:  3.81 cm/s                ejection        LV e' lateral: 3.92 cm/s                fraction by                PLAX is 24                %. LVIDd:         5.40 cm LVIDs:  4.80 cm LV PW:         1.00 cm LV IVS:        0.80 cm LVOT diam:     2.40 cm LV SV:         82 LV SV Index:   39 LVOT Area:     4.52 cm  RIGHT VENTRICLE RV S prime:     13.60 cm/s TAPSE (M-mode): 2.1 cm LEFT ATRIUM             Index LA diam:        3.70 cm 1.76 cm/m LA Vol (A2C):   36.7 ml 17.45 ml/m LA Vol (A4C):   25.6 ml 12.17 ml/m LA Biplane Vol: 31.1 ml 14.79 ml/m  AORTIC VALVE LVOT Vmax:   79.00 cm/s LVOT Vmean:  55.400 cm/s LVOT VTI:    0.181 m  AORTA Ao Root diam: 4.30 cm  SHUNTS Systemic VTI:  0.18 m Systemic Diam: 2.40 cm Rudean Haskell MD Electronically signed by Rudean Haskell MD Signature Date/Time: 11/24/2019/2:46:57 PM    Final      Assessment/Plan 1. Chronic systolic CHF (congestive heart failure) ([place]) Has a high sodium diet, now with some worsening LE edema and dyspnea on exertion.  No noted weight gain Will have him change diet -increase lasix to 40 mg daily for 3 days then resume lasix 20 mg daily To continue to monitor swelling and weight and to notify if symptoms worsen or if shortness of breath worsens.   2. Leg edema -encouraged dietary modification as well at this time -compression hose -elevation of LE.   Next appt: 01/24/2020, sooner if need.  Carlos American. Guy, [facility] [phone]

## 2019-12-05 NOTE — Patient Instructions (Addendum)
Increase lasix to 40 mg daily for 3 days then resume lasix 20 mg daily   To notify if shortness of breath or swelling does not improve or worsens   DASH Eating Plan DASH stands for "Dietary Approaches to Stop Hypertension." The DASH eating plan is a healthy eating plan that has been shown to reduce high blood pressure (hypertension). It may also reduce your risk for type 2 diabetes, heart disease, and stroke. The DASH eating plan may also help with weight loss. What are tips for following this plan?  General guidelines  Avoid eating more than 2,300 mg (milligrams) of salt (sodium) a day. If you have hypertension, you may need to reduce your sodium intake to 1,500 mg a day.  Limit alcohol intake to no more than 1 drink a day for nonpregnant women and 2 drinks a day for men. One drink equals 12 oz of beer, 5 oz of wine, or 1 oz of hard liquor.  Work with your health care provider to maintain a healthy body weight or to lose weight. Ask what an ideal weight is for you.  Get at least 30 minutes of exercise that causes your heart to beat faster (aerobic exercise) most days of the week. Activities may include walking, swimming, or biking.  Work with your health care provider or diet and nutrition specialist (dietitian) to adjust your eating plan to your individual calorie needs. Reading food labels   Check food labels for the amount of sodium per serving. Choose foods with less than 5 percent of the Daily Value of sodium. Generally, foods with less than 300 mg of sodium per serving fit into this eating plan.  To find whole grains, look for the word "whole" as the first word in the ingredient list. Shopping  Buy products labeled as "low-sodium" or "no salt added."  Buy fresh foods. Avoid canned foods and premade or frozen meals. Cooking  Avoid adding salt when cooking. Avoid salt substitutes.   Do not fry foods. Cook foods using healthy methods such as baking, boiling, grilling, and  broiling instead.  Cook with heart-healthy oils, such as olive, canola, soybean, or sunflower oil. Meal planning  Eat a balanced diet that includes: ? 5 or more servings of fruits and vegetables each day. At each meal, try to fill half of your plate with fruits and vegetables. ? Up to 6-8 servings of whole grains each day. ? Less than 6 oz of lean meat, poultry, or fish each day. A 3-oz serving of meat is about the same size as a deck of cards. One egg equals 1 oz. ? 2 servings of low-fat dairy each day. ? A serving of nuts, seeds, or beans 5 times each week. ? Heart-healthy fats. Healthy fats called Omega-3 fatty acids are found in foods such as flaxseeds and coldwater fish, like sardines, salmon, and mackerel.  Limit how much you eat of the following: ? Canned or prepackaged foods. ? Food that is high in trans fat, such as fried foods. ? Food that is high in saturated fat, such as fatty meat. ? Sweets, desserts, sugary drinks, and other foods with added sugar. ? Full-fat dairy products.  Do not salt foods before eating.  Try to eat at least 2 vegetarian meals each week.  Eat more home-cooked food and less restaurant, buffet, and fast food.  When eating at a restaurant, ask that your food be prepared with less salt or no salt, if possible. What foods are recommended? The items  listed may not be a complete list. Talk with your dietitian about what dietary choices are best for you. Grains Whole-grain or whole-wheat bread. Whole-grain or whole-wheat pasta. Brown rice. [place]row. Bulgur. Whole-grain and low-sodium cereals. Pita bread. Low-fat, low-sodium crackers. Whole-wheat flour tortillas. Vegetables Fresh or frozen vegetables (raw, steamed, roasted, or grilled). Low-sodium or reduced-sodium tomato and vegetable juice. Low-sodium or reduced-sodium tomato sauce and tomato paste. Low-sodium or reduced-sodium canned vegetables. Fruits All fresh, dried, or frozen fruit. Canned  fruit in natural juice (without added sugar). Meat and other protein foods Skinless chicken or [place]. Ground chicken or [place]. Pork with fat trimmed off. Fish and seafood. Egg whites. Dried beans, peas, or lentils. Unsalted nuts, nut butters, and seeds. Unsalted canned beans. Lean cuts of beef with fat trimmed off. Low-sodium, lean deli meat. Dairy Low-fat (1%) or fat-free (skim) milk. Fat-free, low-fat, or reduced-fat cheeses. Nonfat, low-sodium ricotta or cottage cheese. Low-fat or nonfat yogurt. Low-fat, low-sodium cheese. Fats and oils Soft margarine without trans fats. Vegetable oil. Low-fat, reduced-fat, or light mayonnaise and salad dressings (reduced-sodium). Canola, safflower, olive, soybean, and sunflower oils. Avocado. Seasoning and other foods Herbs. Spices. Seasoning mixes without salt. Unsalted popcorn and pretzels. Fat-free sweets. What foods are not recommended? The items listed may not be a complete list. Talk with your dietitian about what dietary choices are best for you. Grains Baked goods made with fat, such as croissants, muffins, or some breads. Dry pasta or rice meal packs. Vegetables Creamed or fried vegetables. Vegetables in a cheese sauce. Regular canned vegetables (not low-sodium or reduced-sodium). Regular canned tomato sauce and paste (not low-sodium or reduced-sodium). Regular tomato and vegetable juice (not low-sodium or reduced-sodium). Angie Fava. Olives. Fruits Canned fruit in a light or heavy syrup. Fried fruit. Fruit in cream or butter sauce. Meat and other protein foods Fatty cuts of meat. Ribs. Fried meat. Berniece Salines. Sausage. Bologna and other processed lunch meats. Salami. Fatback. Hotdogs. Bratwurst. Salted nuts and seeds. Canned beans with added salt. Canned or smoked fish. Whole eggs or egg yolks. Chicken or [place] with skin. Dairy Whole or 2% milk, cream, and half-and-half. Whole or full-fat cream cheese. Whole-fat or sweetened yogurt. Full-fat cheese.  Nondairy creamers. Whipped toppings. Processed cheese and cheese spreads. Fats and oils Butter. Stick margarine. Lard. Shortening. Ghee. Bacon fat. Tropical oils, such as coconut, palm kernel, or palm oil. Seasoning and other foods Salted popcorn and pretzels. Onion salt, garlic salt, seasoned salt, table salt, and sea salt. Worcestershire sauce. Tartar sauce. Barbecue sauce. Teriyaki sauce. Soy sauce, including reduced-sodium. Steak sauce. Canned and packaged gravies. Fish sauce. Oyster sauce. Cocktail sauce. Horseradish that you find on the shelf. Ketchup. Mustard. Meat flavorings and tenderizers. Bouillon cubes. Hot sauce and Tabasco sauce. Premade or packaged marinades. Premade or packaged taco seasonings. Relishes. Regular salad dressings. Where to find more information:  National Heart, Lung, and [place]: https://wilson-eaton.com/  American Heart Association: www.heart.org Summary  The DASH eating plan is a healthy eating plan that has been shown to reduce high blood pressure (hypertension). It may also reduce your risk for type 2 diabetes, heart disease, and stroke.  With the DASH eating plan, you should limit salt (sodium) intake to 2,300 mg a day. If you have hypertension, you may need to reduce your sodium intake to 1,500 mg a day.  When on the DASH eating plan, aim to eat more fresh fruits and vegetables, whole grains, lean proteins, low-fat dairy, and heart-healthy fats.  Work with your health care provider or  diet and nutrition specialist (dietitian) to adjust your eating plan to your individual calorie needs. This information is not intended to replace advice given to you by your health care provider. Make sure you discuss any questions you have with your health care provider. Document Revised: 01/23/2017 Document Reviewed: 02/04/2016 Elsevier Patient Education  2020 [employer].

## 2019-12-07 ENCOUNTER — Telehealth (HOSPITAL_COMMUNITY): Payer: Self-pay | Admitting: Licensed Clinical Social Worker

## 2019-12-07 ENCOUNTER — Other Ambulatory Visit (HOSPITAL_COMMUNITY): Payer: Self-pay

## 2019-12-07 ENCOUNTER — Telehealth (HOSPITAL_COMMUNITY): Payer: Self-pay | Admitting: *Deleted

## 2019-12-07 DIAGNOSIS — I5042 Chronic combined systolic (congestive) and diastolic (congestive) heart failure: Secondary | ICD-10-CM

## 2019-12-07 NOTE — Telephone Encounter (Signed)
Patient identified as a candidate to receive 7 heart healthy meals per week for 4 weeks through [facility].  Completed referral sent in for review.  Anticipate patient will receive first shipment of food in 1-3 business days.  Jorge Ny, LCSW Clinical Social Worker [facility] Desk#: [phone] Cell#: [phone]

## 2019-12-07 NOTE — Progress Notes (Signed)
Paramedicine Encounter    Patient ID: Brent [name], male    DOB: 1945-05-25, 74 y.o.   MRN: [id number]   Patient Care Team: Gayland Curry, DO as PCP - General (Geriatric Medicine) Martinique, Peter M, MD as PCP - Cardiology (Cardiology) Larey Dresser, MD as PCP - Advanced Heart Failure (Cardiology) Elmarie Shiley, MD (Nephrology) Milus Banister, MD (Gastroenterology)  Patient Active Problem List   Diagnosis Date Noted  . Decreased appetite 10/08/2019  . Weakness 10/08/2019  . Hyperkalemia 08/26/2019  . Chronic systolic CHF (congestive heart failure) ([place]) 08/26/2019  . Type 2 diabetes mellitus with stage 3 chronic kidney disease ([place]) 08/26/2019  . Chronic obstructive pulmonary disease ([place]) 06/20/2019  . Ventricular tachycardia ([place]) 06/20/2019  . Acute on chronic systolic (congestive) heart failure ([place]) 04/25/2019  . Metabolic acidosis 02/16/8249  . Orthostasis 09/07/2018  . AKI (acute kidney injury) ([place])   . Immunosuppressed status ([place])   . Macrocytic anemia   . Chronic combined systolic and diastolic congestive heart failure ([place]) 06/03/2018  . Candida esophagitis ([place]) 09/22/2017  . History of smoking 30 or more pack years 01/05/2017  . Bipolar 1 disorder ([place])   . BPH (benign prostatic hyperplasia) 03/31/2013  . 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 Essential hypertension, benign 03/31/2013  . Acute renal failure superimposed on stage 3 chronic kidney disease ([place]) 06/04/2012  . 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[place]) 01/23/2010    Current Outpatient Medications:  .  allopurinol (ZYLOPRIM) 100 MG tablet, Take 2 tablets (200 mg total) by mouth daily., Disp: 60 tablet, Rfl: 5 .  aspirin EC 81 MG tablet, Take 1 tablet (81 mg total) by mouth daily., Disp: 90 tablet, Rfl: 3 .  Cholecalciferol (VITAMIN D) 125 MCG (5000 UT) CAPS, Take 5,000 Units by mouth daily. , Disp: , Rfl:  .  dapagliflozin propanediol (FARXIGA) 10 MG TABS tablet, Take 1 tablet (10 mg total) by mouth  daily before breakfast., Disp: 30 tablet, Rfl: 6 .  divalproex (DEPAKOTE ER) 500 MG 24 hr tablet, Take 1,500 mg by mouth at bedtime., Disp: , Rfl:  .  epoetin alfa (PROCRIT) 03704 UNIT/ML injection, Inject 10,000 Units into the skin every 30 (thirty) days. , Disp: , Rfl:  .  ferrous sulfate 325 (65 FE) MG tablet, Take 325 mg by mouth every Monday, Wednesday, and Friday. , Disp: , Rfl:  .  furosemide (LASIX) 20 MG tablet, Take 20 mg by mouth every Monday, Wednesday, and Friday., Disp: , Rfl:  .  gentamicin (GARAMYCIN) 0.3 % ophthalmic solution, Place 1 drop into the left eye 3 (three) times daily., Disp: , Rfl:  .  hydrALAZINE (APRESOLINE) 25 MG tablet, Take 1 tablet (25 mg total) by mouth 2 (two) times daily., Disp: 60 tablet, Rfl: 3 .  inFLIXimab (REMICADE) 100 MG injection, Infuse Remicade IV schedule 1 66m/kg every 8 weeks Premedicate with Tylenol 500-6592mby mouth and Benadryl 25-5041my mouth prior to infusion. Last PPD was on 12/2009. , Disp: 1 each, Rfl: 6 .  isosorbide mononitrate (IMDUR) 30 MG 24 hr tablet, Take 1 tablet (30 mg total) by mouth daily., Disp: 90 tablet, Rfl: 3 .  ivabradine (CORLANOR) 5 MG TABS tablet, Take 1 tablet (5 mg total) by mouth 2 (two) times daily with a meal., Disp: 180 tablet, Rfl: 3 .  loperamide (IMODIUM) 2 MG capsule, Take 1 capsule (2 mg total) by mouth 4 (four) times daily as needed for diarrhea or loose stools., Disp: 12 capsule, Rfl: 0 .  magnesium oxide (MAG-OX) 400 MG tablet, Take 2 tablets (800 mg total) by mouth in the morning, at noon, and at bedtime., Disp: 60 tablet, Rfl: 2 .  metoprolol succinate (TOPROL-XL) 25 MG 24 hr tablet, Take 1 tablet (25 mg total) by mouth at bedtime., Disp: 90 tablet, Rfl: 3 .  OLANZapine (ZYPREXA) 7.5 MG tablet, Take 1 tablet (7.5 mg total) by mouth at bedtime., Disp: 30 tablet, Rfl: 11 .  omeprazole (PRILOSEC) 20 MG capsule, Take 20 mg by mouth daily before breakfast. , Disp: , Rfl:  .  tamsulosin (FLOMAX) 0.4 MG CAPS  capsule, Take 1 capsule (0.4 mg total) by mouth daily., Disp: 30 capsule, Rfl: 2 .  triamcinolone (NASACORT ALLERGY 24HR) 55 MCG/ACT AERO nasal inhaler, Place 2 sprays into the nose daily as needed (allergies)., Disp: , Rfl:  .  vitamin B-12 (CYANOCOBALAMIN) 1000 MCG tablet, Take 1,000 mcg by mouth daily., Disp: , Rfl:  Allergies  Allergen Reactions  . Azathioprine Other (See Comments)    REACTION: affected WBC "Almost died"  . Ciprofloxacin Other (See Comments)    Unknown rxn  . Levaquin [Levofloxacin In D5w] Other (See Comments)    Unknown rxn  . Plendil [Felodipine] Other (See Comments)    Unknown rxn     Social History   Socioeconomic History  . Marital status: Divorced    Spouse name: Not on file  . Number of children: 1  . Years of education: 54  . Highest education level: Not on file  Occupational History  . Occupation: retired  . Occupation: Veteran  Tobacco Use  . Smoking status: Former Smoker    Packs/day: 1.00    Years: 49.00    Pack years: 49.00    Types: Cigarettes    Start date: 04/11/1956    Quit date: 04/17/2014    Years since quitting: 5.6  . Smokeless tobacco: Never Used  Vaping Use  . Vaping Use: Never used  Substance and Sexual Activity  . Alcohol use: No    Alcohol/week: 0.0 standard drinks  . Drug use: No  . Sexual activity: Never  Other Topics Concern  . Not on file  Social History Narrative  . Not on file   Social Determinants of Health   Financial Resource Strain:   . Difficulty of Paying Living Expenses: Not on file  Food Insecurity:   . Worried About Charity fundraiser in the Last Year: Not on file  . Ran Out of Food in the Last Year: Not on file  Transportation Needs:   . Lack of Transportation (Medical): Not on file  . Lack of Transportation (Non-Medical): Not on file  Physical Activity:   . Days of Exercise per Week: Not on file  . Minutes of Exercise per Session: Not on file  Stress:   . Feeling of Stress : Not on file   Social Connections:   . Frequency of Communication with Friends and Family: Not on file  . Frequency of Social Gatherings with Friends and Family: Not on file  . Attends Religious Services: Not on file  . Active Member of Clubs or Organizations: Not on file  . Attends Archivist Meetings: Not on file  . Marital Status: Not on file  Intimate Partner Violence:   . Fear of Current or Ex-Partner: Not on file  . Emotionally Abused: Not on file  . Physically Abused: Not on file  . Sexually Abused: Not on file    Physical Exam Vitals reviewed.  Constitutional:      Appearance: He is normal weight.  HENT:     Head: Normocephalic.     Nose: Nose normal.     Mouth/Throat:     Mouth: Mucous membranes are moist.     Pharynx: Oropharynx is clear.  Eyes:     Pupils: Pupils are equal, round, and reactive to light.  Cardiovascular:     Rate and Rhythm: Normal rate and regular rhythm.     Pulses: Normal pulses.     Heart sounds: Normal heart sounds.  Pulmonary:     Effort: Pulmonary effort is normal.     Breath sounds: Normal breath sounds.  Abdominal:     General: Abdomen is flat.     Palpations: Abdomen is soft.  Musculoskeletal:        General: Swelling present. Normal range of motion.     Cervical back: Normal range of motion.     Right lower leg: Edema present.     Left lower leg: No edema.  Skin:    General: Skin is warm and dry.     Capillary Refill: Capillary refill takes less than 2 seconds.  Neurological:     General: No focal deficit present.     Mental Status: He is alert.  Psychiatric:        Mood and Affect: Mood normal.    Arrived for home visit for Brent [name] who was alert and oriented reporting feeling "okay" but stated last week he had some increase in his weight as well as some swelling in his feet with some increased shortness of breath when walking so he saw his PCP who restarted his Lasix 73m daily for 3 days then 210mdaily after. Clinic states this  has helped and improved his symptoms. I reported same to HF clinic staff who placed him on the schedule for labs. I reviewed medications and filled pill box accordingly. Physical assessment noted patient had some swelling to right foot and lung sounds were clear. Vitals as noted. Brent [name] agreed to visit for one week. I will check on patient on Monday on the phone to see how weight is with Lasix increase.  Home visit complete.   Refills:  Magnesium Oxide  Corlanor FaWilder Glade  Future Appointments  Date Time Provider DeCaldwell10/22/2021  9:00 AM [facility] RM 1 [facility] None  12/16/2019 12:00 PM TaEvans LanceMD [facility]  01/24/2020  2:15 PM [name], DiNelda BucksNP [facility] None  02/23/2020 11:00 AM ReGayland CurryDO [facility] None  02/27/2020  1:40 PM McLarey DresserMD [facility] None     ACTION: Home visit completed Next visit planned for one week

## 2019-12-07 NOTE — Telephone Encounter (Signed)
Brent [name] w/paramedicine called to report pt was seen by PCP and his weight was up and Brent [name] they had him take lasix 102m daily x 3days and then decrease to 230mdaily. Brent [name] agreeable with plan but pt needs labs next week. Lab appt scheduled pt and Brent [name] aware.

## 2019-12-08 ENCOUNTER — Telehealth (HOSPITAL_COMMUNITY): Payer: Self-pay

## 2019-12-08 ENCOUNTER — Ambulatory Visit (HOSPITAL_COMMUNITY)
Admission: RE | Admit: 2019-12-08 | Discharge: 2019-12-08 | Disposition: A | Discharge status: Home / Self Care | Payer: Medicare Other | Source: Ambulatory Visit | Attending: Internal Medicine | Admitting: Internal Medicine

## 2019-12-08 ENCOUNTER — Other Ambulatory Visit: Payer: Self-pay

## 2019-12-08 DIAGNOSIS — I5042 Chronic combined systolic (congestive) and diastolic (congestive) heart failure: Secondary | ICD-10-CM | POA: Diagnosis not present

## 2019-12-08 LAB — BASIC METABOLIC PANEL
Anion gap: 10 (ref 5–15)
BUN: 38 mg/dL — ABNORMAL HIGH (ref 8–23)
CO2: 25 mmol/L (ref 22–32)
Calcium: 9.1 mg/dL (ref 8.9–10.3)
Chloride: 106 mmol/L (ref 98–111)
Creatinine, Ser: 2.38 mg/dL — ABNORMAL HIGH (ref 0.61–1.24)
GFR, Estimated: 26 mL/min — ABNORMAL LOW (ref >60)
Glucose, Bld: 91 mg/dL (ref 70–99)
Potassium: 5.2 mmol/L — ABNORMAL HIGH (ref 3.5–5.1)
Sodium: 141 mmol/L (ref 135–145)

## 2019-12-08 NOTE — Telephone Encounter (Signed)
Spoke to [name] about his visit with the mental health facility yesterday and he reports they are taking him off olanzapine and adding haldol to his medication list. [name] was instructed to take the olanzapine out of his box and add in haldol. [name] also informed about lab visit for today at 1400. [name] agreed. Call complete.

## 2019-12-09 ENCOUNTER — Other Ambulatory Visit: Payer: Self-pay | Admitting: Internal Medicine

## 2019-12-09 MED ORDER — MAGNESIUM OXIDE 400 MG PO TABS
800.0000 mg | ORAL_TABLET | Freq: Three times a day (TID) | ORAL | 2 refills | Status: DC
Start: 2019-12-09 — End: 2020-02-27

## 2019-12-14 ENCOUNTER — Other Ambulatory Visit (HOSPITAL_COMMUNITY): Payer: Self-pay

## 2019-12-14 NOTE — Progress Notes (Signed)
Paramedicine Encounter    Patient ID: Brent [name], male    DOB: 1945/11/06, 74 y.o.   MRN: [id number]   Patient Care Team: Gayland Curry, DO as PCP - General (Geriatric Medicine) Martinique, Peter M, MD as PCP - Cardiology (Cardiology) Larey Dresser, MD as PCP - Advanced Heart Failure (Cardiology) Elmarie Shiley, MD (Nephrology) Milus Banister, MD (Gastroenterology)  Patient Active Problem List   Diagnosis Date Noted   Decreased appetite 10/08/2019   Weakness 10/08/2019   Hyperkalemia 57/26/2035   Chronic systolic CHF (congestive heart failure) ([place]) 08/26/2019   Type 2 diabetes mellitus with stage 3 chronic kidney disease ([place]) 08/26/2019   Chronic obstructive pulmonary disease ([place]) 06/20/2019   Ventricular tachycardia ([place]) 06/20/2019   Acute on chronic systolic (congestive) heart failure ([place]) 59/74/1638   Metabolic acidosis 45/36/4680   Orthostasis 09/07/2018   AKI (acute kidney injury) ([place])    Immunosuppressed status ([place])    Macrocytic anemia    Chronic combined systolic and diastolic congestive heart failure ([place]) 06/03/2018   Candida esophagitis ([place]) 09/22/2017   History of smoking 30 or more pack years 01/05/2017   Bipolar 1 disorder ([place])    BPH (benign prostatic hyperplasia) 03/31/2013   Anemia in chronic kidney disease 03/31/2013   Essential hypertension, benign 03/31/2013   Acute renal failure superimposed on stage 3 chronic kidney disease ([place]) 06/04/2012   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[place]) 01/23/2010    Current Outpatient Medications:    allopurinol (ZYLOPRIM) 100 MG tablet, Take 2 tablets (200 mg total) by mouth daily., Disp: 60 tablet, Rfl: 5   aspirin EC 81 MG tablet, Take 1 tablet (81 mg total) by mouth daily., Disp: 90 tablet, Rfl: 3   Cholecalciferol (VITAMIN D) 125 MCG (5000 UT) CAPS, Take 5,000 Units by mouth daily. , Disp: , Rfl:    dapagliflozin propanediol (FARXIGA) 10 MG TABS tablet, Take 1 tablet (10 mg total) by mouth  daily before breakfast., Disp: 30 tablet, Rfl: 6   divalproex (DEPAKOTE ER) 500 MG 24 hr tablet, Take 1,500 mg by mouth at bedtime., Disp: , Rfl:    epoetin alfa (PROCRIT) 32122 UNIT/ML injection, Inject 10,000 Units into the skin every 30 (thirty) days. , Disp: , Rfl:    ferrous sulfate 325 (65 FE) MG tablet, Take 325 mg by mouth every Monday, Wednesday, and Friday. , Disp: , Rfl:    furosemide (LASIX) 20 MG tablet, Take 20 mg by mouth daily., Disp: , Rfl:    gentamicin (GARAMYCIN) 0.3 % ophthalmic solution, Place 1 drop into the left eye 3 (three) times daily., Disp: , Rfl:    hydrALAZINE (APRESOLINE) 25 MG tablet, Take 1 tablet (25 mg total) by mouth 2 (two) times daily., Disp: 60 tablet, Rfl: 3   inFLIXimab (REMICADE) 100 MG injection, Infuse Remicade IV schedule 1 20m/kg every 8 weeks Premedicate with Tylenol 500-6561mby mouth and Benadryl 25-5071my mouth prior to infusion. Last PPD was on 12/2009. , Disp: 1 each, Rfl: 6   isosorbide mononitrate (IMDUR) 30 MG 24 hr tablet, Take 1 tablet (30 mg total) by mouth daily., Disp: 90 tablet, Rfl: 3   ivabradine (CORLANOR) 5 MG TABS tablet, Take 1 tablet (5 mg total) by mouth 2 (two) times daily with a meal., Disp: 180 tablet, Rfl: 3   loperamide (IMODIUM) 2 MG capsule, Take 1 capsule (2 mg total) by mouth 4 (four) times daily as needed for diarrhea or loose stools., Disp: 12 capsule, Rfl: 0   magnesium oxide (  MAG-OX) 400 MG tablet, Take 2 tablets (800 mg total) by mouth in the morning, at noon, and at bedtime., Disp: 60 tablet, Rfl: 2   metoprolol succinate (TOPROL-XL) 25 MG 24 hr tablet, Take 1 tablet (25 mg total) by mouth at bedtime., Disp: 90 tablet, Rfl: 3   OLANZapine (ZYPREXA) 7.5 MG tablet, Take 1 tablet (7.5 mg total) by mouth at bedtime., Disp: 30 tablet, Rfl: 11   omeprazole (PRILOSEC) 20 MG capsule, Take 20 mg by mouth daily before breakfast. , Disp: , Rfl:    tamsulosin (FLOMAX) 0.4 MG CAPS capsule, Take 1 capsule (0.4 mg  total) by mouth daily., Disp: 30 capsule, Rfl: 2   triamcinolone (NASACORT ALLERGY 24HR) 55 MCG/ACT AERO nasal inhaler, Place 2 sprays into the nose daily as needed (allergies)., Disp: , Rfl:    vitamin B-12 (CYANOCOBALAMIN) 1000 MCG tablet, Take 1,000 mcg by mouth daily., Disp: , Rfl:  Allergies  Allergen Reactions   Azathioprine Other (See Comments)    REACTION: affected WBC "Almost died"   Ciprofloxacin Other (See Comments)    Unknown rxn   Levaquin [Levofloxacin In D5w] Other (See Comments)    Unknown rxn   Plendil [Felodipine] Other (See Comments)    Unknown rxn     Social History   Socioeconomic History   Marital status: Divorced    Spouse name: Not on file   Number of children: 1   Years of education: 12   Highest education level: Not on file  Occupational History   Occupation: retired   Occupation: Veteran  Tobacco Use   Smoking status: Former Smoker    Packs/day: 1.00    Years: 49.00    Pack years: 49.00    Types: Cigarettes    Start date: 04/11/1956    Quit date: 04/17/2014    Years since quitting: 5.6   Smokeless tobacco: Never Used  Vaping Use   Vaping Use: Never used  Substance and Sexual Activity   Alcohol use: No    Alcohol/week: 0.0 standard drinks   Drug use: No   Sexual activity: Never  Other Topics Concern   Not on file  Social History Narrative   Not on file   Social Determinants of Health   Financial Resource Strain:    Difficulty of Paying Living Expenses: Not on file  Food Insecurity:    Worried About Charity fundraiser in the Last Year: Not on file   [place] in the Last Year: Not on file  Transportation Needs:    Lack of Transportation (Medical): Not on file   Lack of Transportation (Non-Medical): Not on file  Physical Activity:    Days of Exercise per Week: Not on file   Minutes of Exercise per Session: Not on file  Stress:    Feeling of Stress : Not on file  Social Connections:    Frequency  of Communication with Friends and Family: Not on file   Frequency of Social Gatherings with Friends and Family: Not on file   Attends Religious Services: Not on file   Active Member of Clubs or Organizations: Not on file   Attends Archivist Meetings: Not on file   Marital Status: Not on file  Intimate Partner Violence:    Fear of Current or Ex-Partner: Not on file   Emotionally Abused: Not on file   Physically Abused: Not on file   Sexually Abused: Not on file    Physical Exam Vitals reviewed.  Constitutional:  Appearance: He is normal weight.  HENT:     Head: Normocephalic.     Nose: Nose normal.     Mouth/Throat:     Mouth: Mucous membranes are moist.     Pharynx: Oropharynx is clear.  Eyes:     Pupils: Pupils are equal, round, and reactive to light.  Cardiovascular:     Rate and Rhythm: Normal rate and regular rhythm.     Pulses: Normal pulses.     Heart sounds: Normal heart sounds.  Pulmonary:     Effort: Pulmonary effort is normal.     Breath sounds: Normal breath sounds.  Abdominal:     General: Abdomen is flat.     Palpations: Abdomen is soft.  Musculoskeletal:        General: Normal range of motion.     Cervical back: Normal range of motion.     Right lower leg: No edema.     Left lower leg: No edema.  Skin:    General: Skin is warm and dry.     Capillary Refill: Capillary refill takes less than 2 seconds.  Neurological:     Mental Status: He is alert. Mental status is at baseline.  Psychiatric:        Mood and Affect: Mood normal.     Arrived for home visit for Brent [name] who was alert seated in his chair. Laverna Peace reports he feels tired today and that he just got back from his appointment at the [facility] and they took his BP and it was in the 90's. I assessed vitals and BP was 98/58 with HR 63. Brent [name] had no swelling noted and reports he has not been eating a whole lot or drinking much water. I advised Brent [name] to be sure he is eating and during visit  Mom's Meals were delivered and he reports he will be eating same. I also encouraged Brent [name] to be sure he is drinking enough fluids. He agreed. Medications were confirmed and pill box filled accordingly. I will check in on patient next week unless other wise needed. Home visit complete.   Refills: NONE     Future Appointments  Date Time Provider [place]  12/16/2019  9:00 AM [facility] RM 1 [facility] None  12/16/2019 12:00 PM Evans Lance, MD [facility]  01/24/2020  2:15 PM [name], [name], NP [facility] None  02/23/2020 11:00 AM Gayland Curry, DO [facility] None  02/27/2020  1:40 PM Larey Dresser, MD [facility] None     ACTION: Home visit completed Next visit planned for one wee

## 2019-12-16 ENCOUNTER — Encounter: Payer: Self-pay | Admitting: Family

## 2019-12-16 ENCOUNTER — Ambulatory Visit (HOSPITAL_COMMUNITY)
Admission: RE | Admit: 2019-12-16 | Discharge: 2019-12-16 | Disposition: A | Discharge status: Home / Self Care | Payer: Medicare Other | Attending: Nephrology | Admitting: Nephrology

## 2019-12-16 ENCOUNTER — Telehealth: Payer: Self-pay | Admitting: Internal Medicine

## 2019-12-16 ENCOUNTER — Ambulatory Visit (INDEPENDENT_AMBULATORY_CARE_PROVIDER_SITE_OTHER): Payer: Medicare Other | Admitting: Family

## 2019-12-16 ENCOUNTER — Ambulatory Visit: Payer: Medicare Other | Admitting: Internal Medicine

## 2019-12-16 ENCOUNTER — Encounter: Payer: Self-pay | Admitting: Internal Medicine

## 2019-12-16 ENCOUNTER — Telehealth (HOSPITAL_COMMUNITY): Payer: Self-pay

## 2019-12-16 ENCOUNTER — Other Ambulatory Visit: Payer: Self-pay

## 2019-12-16 VITALS — BP 118/72 | HR 88 | Temp 97.7°F | Ht 72.0 in | Wt 183.0 lb

## 2019-12-16 VITALS — BP 98/66 | HR 94 | Ht 76.0 in | Wt 181.0 lb

## 2019-12-16 DIAGNOSIS — K1379 Other lesions of oral mucosa: Secondary | ICD-10-CM

## 2019-12-16 DIAGNOSIS — D631 Anemia in chronic kidney disease: Secondary | ICD-10-CM | POA: Insufficient documentation

## 2019-12-16 DIAGNOSIS — I5022 Chronic systolic (congestive) heart failure: Secondary | ICD-10-CM

## 2019-12-16 DIAGNOSIS — R42 Dizziness and giddiness: Secondary | ICD-10-CM | POA: Diagnosis not present

## 2019-12-16 DIAGNOSIS — N189 Chronic kidney disease, unspecified: Secondary | ICD-10-CM | POA: Diagnosis not present

## 2019-12-16 DIAGNOSIS — N183 Chronic kidney disease, stage 3 unspecified: Secondary | ICD-10-CM | POA: Diagnosis present

## 2019-12-16 DIAGNOSIS — I10 Essential (primary) hypertension: Secondary | ICD-10-CM

## 2019-12-16 LAB — IRON AND TIBC
Iron: 63 ug/dL (ref 45–182)
Saturation Ratios: 23 % (ref 17.9–39.5)
TIBC: 275 ug/dL (ref 250–450)
UIBC: 212 ug/dL

## 2019-12-16 LAB — HEMOGLOBIN AND HEMATOCRIT, BLOOD
HCT: 39.1 % (ref 39.0–52.0)
Hemoglobin: 11.9 g/dL — ABNORMAL LOW (ref 13.0–17.0)

## 2019-12-16 LAB — FERRITIN: Ferritin: 1010 ng/mL — ABNORMAL HIGH (ref 24–336)

## 2019-12-16 MED ORDER — EPOETIN ALFA 10000 UNIT/ML IJ SOLN
10000.0000 [IU] | INTRAMUSCULAR | Status: DC
  Administered 2019-12-16: 10000 [IU] via SUBCUTANEOUS
  Filled 2019-12-16: qty 1

## 2019-12-16 NOTE — Patient Instructions (Addendum)
Medication Instructions:  Your physician recommends that you continue on your current medications as directed. Please refer to the Current Medication list given to you today.  Labwork: None ordered.  Testing/Procedures: None ordered.  Follow-Up:  The following dates are available for this procedure:  November 2, 3, 8, 15, 18, 22, 29  Any Other Special Instructions Will Be Listed Below (If Applicable).  If you need a refill on your cardiac medications before your next appointment, please call your pharmacy.    Cardioverter Defibrillator Implantation  An implantable cardioverter defibrillator (ICD) is a small device that is placed under the skin in the chest or abdomen. An ICD consists of a battery, a small computer (pulse generator), and wires (leads) that go into the heart. An ICD is used to detect and correct two types of dangerous irregular heartbeats (arrhythmias):  A rapid heart rhythm (tachycardia).  An arrhythmia in which the lower chambers of the heart (ventricles) contract in an uncoordinated way (fibrillation). When an ICD detects tachycardia, it sends a low-energy shock to the heart to restore the heartbeat to normal (cardioversion). This signal is usually painless. If cardioversion does not work or if the ICD detects fibrillation, it delivers a high-energy shock to the heart (defibrillation) to restart the heart. This shock may feel like a strong jolt in the chest. Your health care provider may prescribe an ICD if:  You have had an arrhythmia that originated in the ventricles.  Your heart has been damaged by a disease or heart condition. Sometimes, ICDs are programmed to act as a device called a pacemaker. Pacemakers can be used to treat a slow heartbeat (bradycardia) or tachycardia by taking over the heart rate with electrical impulses. Tell a health care provider about:  Any allergies you have.  All medicines you are taking, including vitamins, herbs, eye drops,  creams, and over-the-counter medicines.  Any problems you or family members have had with anesthetic medicines.  Any blood disorders you have.  Any surgeries you have had.  Any medical conditions you have.  Whether you are pregnant or may be pregnant. What are the risks? Generally, this is a safe procedure. However, problems may occur, including:  Swelling, bleeding, or bruising.  Infection.  Blood clots.  Damage to other structures or organs, such as nerves, blood vessels, or the heart.  Allergic reactions to medicines used during the procedure. What happens before the procedure? Staying hydrated Follow instructions from your health care provider about hydration, which may include:  Up to 2 hours before the procedure - you may continue to drink clear liquids, such as water, clear fruit juice, black coffee, and plain tea. Eating and drinking restrictions Follow instructions from your health care provider about eating and drinking, which may include:  8 hours before the procedure - stop eating heavy meals or foods such as meat, fried foods, or fatty foods.  6 hours before the procedure - stop eating light meals or foods, such as toast or cereal.  6 hours before the procedure - stop drinking milk or drinks that contain milk.  2 hours before the procedure - stop drinking clear liquids. Medicine Ask your health care provider about:  Changing or stopping your normal medicines. This is important if you take diabetes medicines or blood thinners.  Taking medicines such as aspirin and ibuprofen. These medicines can thin your blood. Do not take these medicines before your procedure if your doctor tells you not to. Tests  You may have blood tests.  You  may have a test to check the electrical signals in your heart (electrocardiogram, ECG).  You may have imaging tests, such as a chest X-ray. General instructions  For 24 hours before the procedure, stop using products that  contain nicotine or tobacco, such as cigarettes and e-cigarettes. If you need help quitting, ask your health care provider.  Plan to have someone take you home from the hospital or clinic.  You may be asked to shower with a germ-killing soap. What happens during the procedure?  To reduce your risk of infection: ? Your health care team will wash or sanitize their hands. ? Your skin will be washed with soap. ? Hair may be removed from the surgical area.  Small monitors will be put on your body. They will be used to check your heart, blood pressure, and oxygen level.  An IV tube will be inserted into one of your veins.  You will be given one or more of the following: ? A medicine to help you relax (sedative). ? A medicine to numb the area (local anesthetic). ? A medicine to make you fall asleep (general anesthetic).  Leads will be guided through a blood vessel into your heart and attached to your heart muscles. Depending on the ICD, the leads may go into one ventricle or they may go into both ventricles and into an upper chamber of the heart. An X-ray machine (fluoroscope) will be usedto help guide the leads.  A small incision will be made to create a deep pocket under your skin.  The pulse generator will be placed into the pocket.  The ICD will be tested.  The incision will be closed with stitches (sutures), skin glue, or staples.  A bandage (dressing) will be placed over the incision. This procedure may vary among health care providers and hospitals. What happens after the procedure?  Your blood pressure, heart rate, breathing rate, and blood oxygen level will be monitored often until the medicines you were given have worn off.  A chest X-ray will be taken to check that the ICD is in the right place.  You will need to stay in the hospital for 1-2 days so your health care provider can make sure your ICD is working.  Do not drive for 24 hours if you received a sedative. Ask your  health care provider when it is safe for you to drive.  You may be given an identification card explaining that you have an ICD. Summary  An implantable cardioverter defibrillator (ICD) is a small device that is placed under the skin in the chest or abdomen. It is used to detect and correct dangerous irregular heartbeats (arrhythmias).  An ICD consists of a battery, a small computer (pulse generator), and wires (leads) that go into the heart.  When an ICD detects rapid heart rhythm (tachycardia), it sends a low-energy shock to the heart to restore the heartbeat to normal (cardioversion). If cardioversion does not work or if the ICD detects uncoordinated heart contractions (fibrillation), it delivers a high-energy shock to the heart (defibrillation) to restart the heart.  You will need to stay in the hospital for 1-2 days to make sure your ICD is working. This information is not intended to replace advice given to you by your health care provider. Make sure you discuss any questions you have with your health care provider. Document Revised: 01/23/2017 Document Reviewed: 02/20/2016 Elsevier Patient Education  2020 [employer].

## 2019-12-16 NOTE — Telephone Encounter (Signed)
Patient called and left a message on triage vm regarding his BP. After speaking with patient he states that his bp dropped down to 94/56 on Wednesday(12/14/19). He also states at that time it felt like he couldn't walk,slight chest ache/pain at that time but reports he called his 24hr nurse line and they advised him that he should call us. Today patient reports that his BP was 123/76 and that he has an appointment with [facility] to further access his symptoms, he states today he feels much better like he could run.   NT#[phone]

## 2019-12-16 NOTE — Progress Notes (Signed)
HPI [name] is referred today by Dr. [name] for evaluation of and consideration for ICD insertion. The patient is a pleasant 74 yo man with chronic renal insufficiency and a non-ischemic CM who has been on maximal medical therapy and has had persistent LV dysfunction, EF 25%. He has not had syncope. He is much improved on medical therapy. He is not an an ACE/ARB due to chronic renal insufficiency.  Allergies  Allergen Reactions  . Azathioprine Other (See Comments)    REACTION: affected WBC "Almost died"  . Ciprofloxacin Other (See Comments)    Unknown rxn  . Levaquin [Levofloxacin In D5w] Other (See Comments)    Unknown rxn  . Plendil [Felodipine] Other (See Comments)    Unknown rxn     Current Outpatient Medications  Medication Sig Dispense Refill  . allopurinol (ZYLOPRIM) 100 MG tablet Take 2 tablets (200 mg total) by mouth daily. 60 tablet 5  . aspirin EC 81 MG tablet Take 1 tablet (81 mg total) by mouth daily. 90 tablet 3  . Cholecalciferol (VITAMIN D) 125 MCG (5000 UT) CAPS Take 5,000 Units by mouth daily.     . dapagliflozin propanediol (FARXIGA) 10 MG TABS tablet Take 1 tablet (10 mg total) by mouth daily before breakfast. 30 tablet 6  . divalproex (DEPAKOTE ER) 500 MG 24 hr tablet Take 1,500 mg by mouth at bedtime.    Marland Kitchen epoetin alfa (PROCRIT) 30160 UNIT/ML injection Inject 10,000 Units into the skin every 30 (thirty) days.     . ferrous sulfate 325 (65 FE) MG tablet Take 325 mg by mouth every Monday, Wednesday, and Friday.     . furosemide (LASIX) 20 MG tablet Take 20 mg by mouth daily.    Marland Kitchen gentamicin (GARAMYCIN) 0.3 % ophthalmic solution Place 1 drop into the left eye 3 (three) times daily.    . hydrALAZINE (APRESOLINE) 25 MG tablet Take 1 tablet (25 mg total) by mouth 2 (two) times daily. 60 tablet 3  . inFLIXimab (REMICADE) 100 MG injection Infuse Remicade IV schedule 1 69m/kg every 8 weeks Premedicate with Tylenol 500-6575mby mouth and Benadryl 25-5067my mouth  prior to infusion. Last PPD was on 12/2009.  1 each 6  . isosorbide mononitrate (IMDUR) 30 MG 24 hr tablet Take 1 tablet (30 mg total) by mouth daily. 90 tablet 3  . ivabradine (CORLANOR) 5 MG TABS tablet Take 1 tablet (5 mg total) by mouth 2 (two) times daily with a meal. 180 tablet 3  . loperamide (IMODIUM) 2 MG capsule Take 1 capsule (2 mg total) by mouth 4 (four) times daily as needed for diarrhea or loose stools. 12 capsule 0  . magnesium oxide (MAG-OX) 400 MG tablet Take 2 tablets (800 mg total) by mouth in the morning, at noon, and at bedtime. 60 tablet 2  . metoprolol succinate (TOPROL-XL) 25 MG 24 hr tablet Take 1 tablet (25 mg total) by mouth at bedtime. 90 tablet 3  . OLANZapine (ZYPREXA) 7.5 MG tablet Take 1 tablet (7.5 mg total) by mouth at bedtime. 30 tablet 11  . omeprazole (PRILOSEC) 20 MG capsule Take 20 mg by mouth daily before breakfast.     . tamsulosin (FLOMAX) 0.4 MG CAPS capsule Take 1 capsule (0.4 mg total) by mouth daily. 30 capsule 2  . triamcinolone (NASACORT ALLERGY 24HR) 55 MCG/ACT AERO nasal inhaler Place 2 sprays into the nose daily as needed (allergies).    . vitamin B-12 (CYANOCOBALAMIN) 1000 MCG tablet Take  1,000 mcg by mouth daily.     No current facility-administered medications for this visit.   Facility-Administered Medications Ordered in Other Visits  Medication Dose Route Frequency Provider Last Rate Last Admin  . epoetin alfa (EPOGEN) injection 10,000 Units  10,000 Units Subcutaneous Q30 days Elmarie Shiley, MD   10,000 Units at 12/16/19 1012     Past Medical History:  Diagnosis Date  . Anemia   . Anxiety   . Benign paroxysmal positional vertigo   . Bipolar I disorder, most recent episode (or current) unspecified   . Celiac disease   . CHF (congestive heart failure) ([place])   . Chronic kidney disease, stage III (moderate) ([facility])   . Crohn's    Remicade q8 weeks  . Depression   . Essential and other specified forms of tremor    medication-induced  Parkinson's, now resolved  . Essential hypertension, benign   . Gout 2018  . Heart failure ([place])   . Hypertrophy of prostate without urinary obstruction and other lower urinary tract symptoms (LUTS)   . Impotence of organic origin   . Insomnia with sleep apnea, unspecified   . Iron deficiency anemia, unspecified   . 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 Neuralgia, neuritis, and radiculitis, unspecified   . Other B-complex deficiencies   . Other extrapyramidal disease and abnormal movement disorder   . Postinflammatory pulmonary fibrosis ([place])   . Tobacco use disorder   . Vertigo 2018    ROS:   All systems reviewed and negative except as noted in the HPI.   Past Surgical History:  Procedure Laterality Date  . CATARACT EXTRACTION, BILATERAL Bilateral 09/2018  . CHOLECYSTECTOMY  07-12-2010  . RIGHT HEART CATH N/A 10/12/2018   Procedure: RIGHT HEART CATH;  Surgeon: Jolaine Artist, MD;  Location: [facility];  Service: Cardiovascular;  Laterality: N/A;  . RIGHT/LEFT HEART CATH AND CORONARY ANGIOGRAPHY N/A 04/26/2019   Procedure: RIGHT/LEFT HEART CATH AND CORONARY ANGIOGRAPHY;  Surgeon: Larey Dresser, MD;  Location: [facility];  Service: Cardiovascular;  Laterality: N/A;  . SMALL INTESTINE SURGERY     x 2     Family History  Problem Relation Age of Onset  . Diabetes Mother        maternal grandmother  . Uterine cancer Mother   . Emphysema Father   . Pneumonia Maternal Grandmother   . Colon cancer Neg Hx      Social History   Socioeconomic History  . Marital status: Divorced    Spouse name: Not on file  . Number of children: 1  . Years of education: 41  . Highest education level: Not on file  Occupational History  . Occupation: retired  . Occupation: Veteran  Tobacco Use  . Smoking status: Former Smoker    Packs/day: 1.00    Years: 49.00    Pack years: 49.00    Types: Cigarettes    Start date: 04/11/1956    Quit date: 04/17/2014    Years since quitting:  5.6  . Smokeless tobacco: Never Used  Vaping Use  . Vaping Use: Never used  Substance and Sexual Activity  . Alcohol use: No    Alcohol/week: 0.0 standard drinks  . Drug use: No  . Sexual activity: Never  Other Topics Concern  . Not on file  Social History Narrative  . Not on file   Social Determinants of Health   Financial Resource Strain:   . Difficulty of Paying Living Expenses: Not on file  Food Insecurity:   . Worried About Charity fundraiser in the Last Year: Not on file  . Ran Out of Food in the Last Year: Not on file  Transportation Needs:   . Lack of Transportation (Medical): Not on file  . Lack of Transportation (Non-Medical): Not on file  Physical Activity:   . Days of Exercise per Week: Not on file  . Minutes of Exercise per Session: Not on file  Stress:   . Feeling of Stress : Not on file  Social Connections:   . Frequency of Communication with Friends and Family: Not on file  . Frequency of Social Gatherings with Friends and Family: Not on file  . Attends Religious Services: Not on file  . Active Member of Clubs or Organizations: Not on file  . Attends Archivist Meetings: Not on file  . Marital Status: Not on file  Intimate Partner Violence:   . Fear of Current or Ex-Partner: Not on file  . Emotionally Abused: Not on file  . Physically Abused: Not on file  . Sexually Abused: Not on file     BP 98/66   Pulse 94   Ht 6' 4"  (1.93 m)   Wt 181 lb (82.1 kg)   SpO2 95%   BMI 22.03 kg/m   Physical Exam:  Well appearing NAD HEENT: Unremarkable Neck:  No JVD, no thyromegally Lymphatics:  No adenopathy Back:  No CVA tenderness Lungs:  Clear HEART:  Regular rate rhythm, no murmurs, no rubs, no clicks Abd:  soft, positive bowel sounds, no organomegally, no rebound, no guarding Ext:  2 plus pulses, no edema, no cyanosis, no clubbing Skin:  No rashes no nodules Neuro:  CN II through XII intact, motor grossly intact  EKG -  nsr   Assess/Plan: 1. Chronic systolic heart failure - I have discussed the treatment options with the patient and the risks/benefits/goals/expectations of ICD insertion were reviewed and he will call us if he wishes to proceed with ICD insertion. 2. Chronic renal insuff - he has stage 4 disesase which appears to be stable.   Carleene Overlie Sukhdeep Wieting,MD

## 2019-12-16 NOTE — Progress Notes (Signed)
PATIENT CARE CENTER NOTE  Diagnosis:Anemia associated with Chronic Renal Failure, anemia associated with renal disease   Provider:Patel, Ulice Dash MD   Procedure:Epoetin Alfa (Procrit) injection   Note:Patient received sub-qProcritinjection inleftarm. Tolerated well. Labs drawn pre-injection and Hemoglobin was11.9. Patient'sBP [id number].Discharge instructions given. Patient alert, oriented and ambulatoryat discharge.

## 2019-12-16 NOTE — H&P (View-Only) (Signed)
HPI [name] is referred today by Dr. [name] for evaluation of and consideration for ICD insertion. The patient is a pleasant 74 yo man with chronic renal insufficiency and a non-ischemic CM who has been on maximal medical therapy and has had persistent LV dysfunction, EF 25%. He has not had syncope. He is much improved on medical therapy. He is not an an ACE/ARB due to chronic renal insufficiency.  Allergies  Allergen Reactions  . Azathioprine Other (See Comments)    REACTION: affected WBC "Almost died"  . Ciprofloxacin Other (See Comments)    Unknown rxn  . Levaquin [Levofloxacin In D5w] Other (See Comments)    Unknown rxn  . Plendil [Felodipine] Other (See Comments)    Unknown rxn     Current Outpatient Medications  Medication Sig Dispense Refill  . allopurinol (ZYLOPRIM) 100 MG tablet Take 2 tablets (200 mg total) by mouth daily. 60 tablet 5  . aspirin EC 81 MG tablet Take 1 tablet (81 mg total) by mouth daily. 90 tablet 3  . Cholecalciferol (VITAMIN D) 125 MCG (5000 UT) CAPS Take 5,000 Units by mouth daily.     . dapagliflozin propanediol (FARXIGA) 10 MG TABS tablet Take 1 tablet (10 mg total) by mouth daily before breakfast. 30 tablet 6  . divalproex (DEPAKOTE ER) 500 MG 24 hr tablet Take 1,500 mg by mouth at bedtime.    Marland Kitchen epoetin alfa (PROCRIT) 01601 UNIT/ML injection Inject 10,000 Units into the skin every 30 (thirty) days.     . ferrous sulfate 325 (65 FE) MG tablet Take 325 mg by mouth every Monday, Wednesday, and Friday.     . furosemide (LASIX) 20 MG tablet Take 20 mg by mouth daily.    Marland Kitchen gentamicin (GARAMYCIN) 0.3 % ophthalmic solution Place 1 drop into the left eye 3 (three) times daily.    . hydrALAZINE (APRESOLINE) 25 MG tablet Take 1 tablet (25 mg total) by mouth 2 (two) times daily. 60 tablet 3  . inFLIXimab (REMICADE) 100 MG injection Infuse Remicade IV schedule 1 55m/kg every 8 weeks Premedicate with Tylenol 500-6538mby mouth and Benadryl 25-50105my mouth  prior to infusion. Last PPD was on 12/2009.  1 each 6  . isosorbide mononitrate (IMDUR) 30 MG 24 hr tablet Take 1 tablet (30 mg total) by mouth daily. 90 tablet 3  . ivabradine (CORLANOR) 5 MG TABS tablet Take 1 tablet (5 mg total) by mouth 2 (two) times daily with a meal. 180 tablet 3  . loperamide (IMODIUM) 2 MG capsule Take 1 capsule (2 mg total) by mouth 4 (four) times daily as needed for diarrhea or loose stools. 12 capsule 0  . magnesium oxide (MAG-OX) 400 MG tablet Take 2 tablets (800 mg total) by mouth in the morning, at noon, and at bedtime. 60 tablet 2  . metoprolol succinate (TOPROL-XL) 25 MG 24 hr tablet Take 1 tablet (25 mg total) by mouth at bedtime. 90 tablet 3  . OLANZapine (ZYPREXA) 7.5 MG tablet Take 1 tablet (7.5 mg total) by mouth at bedtime. 30 tablet 11  . omeprazole (PRILOSEC) 20 MG capsule Take 20 mg by mouth daily before breakfast.     . tamsulosin (FLOMAX) 0.4 MG CAPS capsule Take 1 capsule (0.4 mg total) by mouth daily. 30 capsule 2  . triamcinolone (NASACORT ALLERGY 24HR) 55 MCG/ACT AERO nasal inhaler Place 2 sprays into the nose daily as needed (allergies).    . vitamin B-12 (CYANOCOBALAMIN) 1000 MCG tablet Take  1,000 mcg by mouth daily.     No current facility-administered medications for this visit.   Facility-Administered Medications Ordered in Other Visits  Medication Dose Route Frequency Provider Last Rate Last Admin  . epoetin alfa (EPOGEN) injection 10,000 Units  10,000 Units Subcutaneous Q30 days Elmarie Shiley, MD   10,000 Units at 12/16/19 1012     Past Medical History:  Diagnosis Date  . Anemia   . Anxiety   . Benign paroxysmal positional vertigo   . Bipolar I disorder, most recent episode (or current) unspecified   . Celiac disease   . CHF (congestive heart failure) ([place])   . Chronic kidney disease, stage III (moderate) ([facility])   . Crohn's    Remicade q8 weeks  . Depression   . Essential and other specified forms of tremor    medication-induced  Parkinson's, now resolved  . Essential hypertension, benign   . Gout 2018  . Heart failure ([place])   . Hypertrophy of prostate without urinary obstruction and other lower urinary tract symptoms (LUTS)   . Impotence of organic origin   . Insomnia with sleep apnea, unspecified   . Iron deficiency anemia, unspecified   . 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 Neuralgia, neuritis, and radiculitis, unspecified   . Other B-complex deficiencies   . Other extrapyramidal disease and abnormal movement disorder   . Postinflammatory pulmonary fibrosis ([place])   . Tobacco use disorder   . Vertigo 2018    ROS:   All systems reviewed and negative except as noted in the HPI.   Past Surgical History:  Procedure Laterality Date  . CATARACT EXTRACTION, BILATERAL Bilateral 09/2018  . CHOLECYSTECTOMY  07-12-2010  . RIGHT HEART CATH N/A 10/12/2018   Procedure: RIGHT HEART CATH;  Surgeon: Jolaine Artist, MD;  Location: [facility];  Service: Cardiovascular;  Laterality: N/A;  . RIGHT/LEFT HEART CATH AND CORONARY ANGIOGRAPHY N/A 04/26/2019   Procedure: RIGHT/LEFT HEART CATH AND CORONARY ANGIOGRAPHY;  Surgeon: Larey Dresser, MD;  Location: [facility];  Service: Cardiovascular;  Laterality: N/A;  . SMALL INTESTINE SURGERY     x 2     Family History  Problem Relation Age of Onset  . Diabetes Mother        maternal grandmother  . Uterine cancer Mother   . Emphysema Father   . Pneumonia Maternal Grandmother   . Colon cancer Neg Hx      Social History   Socioeconomic History  . Marital status: Divorced    Spouse name: Not on file  . Number of children: 1  . Years of education: 84  . Highest education level: Not on file  Occupational History  . Occupation: retired  . Occupation: Veteran  Tobacco Use  . Smoking status: Former Smoker    Packs/day: 1.00    Years: 49.00    Pack years: 49.00    Types: Cigarettes    Start date: 04/11/1956    Quit date: 04/17/2014    Years since quitting:  5.6  . Smokeless tobacco: Never Used  Vaping Use  . Vaping Use: Never used  Substance and Sexual Activity  . Alcohol use: No    Alcohol/week: 0.0 standard drinks  . Drug use: No  . Sexual activity: Never  Other Topics Concern  . Not on file  Social History Narrative  . Not on file   Social Determinants of Health   Financial Resource Strain:   . Difficulty of Paying Living Expenses: Not on file  Food Insecurity:   . Worried About Charity fundraiser in the Last Year: Not on file  . Ran Out of Food in the Last Year: Not on file  Transportation Needs:   . Lack of Transportation (Medical): Not on file  . Lack of Transportation (Non-Medical): Not on file  Physical Activity:   . Days of Exercise per Week: Not on file  . Minutes of Exercise per Session: Not on file  Stress:   . Feeling of Stress : Not on file  Social Connections:   . Frequency of Communication with Friends and Family: Not on file  . Frequency of Social Gatherings with Friends and Family: Not on file  . Attends Religious Services: Not on file  . Active Member of Clubs or Organizations: Not on file  . Attends Archivist Meetings: Not on file  . Marital Status: Not on file  Intimate Partner Violence:   . Fear of Current or Ex-Partner: Not on file  . Emotionally Abused: Not on file  . Physically Abused: Not on file  . Sexually Abused: Not on file     BP 98/66   Pulse 94   Ht 6' 4"  (1.93 m)   Wt 181 lb (82.1 kg)   SpO2 95%   BMI 22.03 kg/m   Physical Exam:  Well appearing NAD HEENT: Unremarkable Neck:  No JVD, no thyromegally Lymphatics:  No adenopathy Back:  No CVA tenderness Lungs:  Clear HEART:  Regular rate rhythm, no murmurs, no rubs, no clicks Abd:  soft, positive bowel sounds, no organomegally, no rebound, no guarding Ext:  2 plus pulses, no edema, no cyanosis, no clubbing Skin:  No rashes no nodules Neuro:  CN II through XII intact, motor grossly intact  EKG -  nsr   Assess/Plan: 1. Chronic systolic heart failure - I have discussed the treatment options with the patient and the risks/benefits/goals/expectations of ICD insertion were reviewed and he will call us if he wishes to proceed with ICD insertion. 2. Chronic renal insuff - he has stage 4 disesase which appears to be stable.   Carleene Overlie Arryanna Holquin,MD

## 2019-12-16 NOTE — Telephone Encounter (Signed)
Patient states he has decided to have his procedure on 01/02/20.

## 2019-12-16 NOTE — Discharge Instructions (Signed)
Blood pressure 123/76, pulse 85, temperature (!) 97.5 F (36.4 C), temperature source Oral, resp. rate 16, SpO2 97 %.    Epoetin Alfa injection What is this medicine? EPOETIN ALFA (e POE e tin AL fa) helps your body make more red blood cells. This medicine is used to treat anemia caused by chronic kidney disease, cancer chemotherapy, or HIV-therapy. It may also be used before surgery if you have anemia. This medicine may be used for other purposes; ask your health care provider or pharmacist if you have questions. COMMON BRAND NAME(S): Epogen, Procrit, Retacrit What should I tell my health care provider before I take this medicine? They need to know if you have any of these conditions:  cancer  heart disease  high blood pressure  history of blood clots  history of stroke  low levels of folate, iron, or vitamin B12 in the blood  seizures  an unusual or allergic reaction to erythropoietin, albumin, benzyl alcohol, hamster proteins, other medicines, foods, dyes, or preservatives  pregnant or trying to get pregnant  breast-feeding How should I use this medicine? This medicine is for injection into a vein or under the skin. It is usually given by a health care professional in a hospital or clinic setting. If you get this medicine at home, you will be taught how to prepare and give this medicine. Use exactly as directed. Take your medicine at regular intervals. Do not take your medicine more often than directed. It is important that you put your used needles and syringes in a special sharps container. Do not put them in a trash can. If you do not have a sharps container, call your pharmacist or healthcare provider to get one. A special MedGuide will be given to you by the pharmacist with each prescription and refill. Be sure to read this information carefully each time. Talk to your pediatrician regarding the use of this medicine in children. While this drug may be prescribed for  selected conditions, precautions do apply. Overdosage: If you think you have taken too much of this medicine contact a poison control center or emergency room at once. NOTE: This medicine is only for you. Do not share this medicine with others. What if I miss a dose? If you miss a dose, take it as soon as you can. If it is almost time for your next dose, take only that dose. Do not take double or extra doses. What may interact with this medicine? Interactions have not been studied. This list may not describe all possible interactions. Give your health care provider a list of all the medicines, herbs, non-prescription drugs, or dietary supplements you use. Also tell them if you smoke, drink alcohol, or use illegal drugs. Some items may interact with your medicine. What should I watch for while using this medicine? Your condition will be monitored carefully while you are receiving this medicine. You may need blood work done while you are taking this medicine. This medicine may cause a decrease in vitamin B6. You should make sure that you get enough vitamin B6 while you are taking this medicine. Discuss the foods you eat and the vitamins you take with your health care professional. What side effects may I notice from receiving this medicine? Side effects that you should report to your doctor or health care professional as soon as possible:  allergic reactions like skin rash, itching or hives, swelling of the face, lips, or tongue  seizures  signs and symptoms of a blood clot  such as breathing problems; changes in vision; chest pain; severe, sudden headache; pain, swelling, warmth in the leg; trouble speaking; sudden numbness or weakness of the face, arm or leg  signs and symptoms of a stroke like changes in vision; confusion; trouble speaking or understanding; severe headaches; sudden numbness or weakness of the face, arm or leg; trouble walking; dizziness; loss of balance or coordination Side  effects that usually do not require medical attention (report to your doctor or health care professional if they continue or are bothersome):  chills  cough  dizziness  fever  headaches  joint pain  muscle cramps  muscle pain  nausea, vomiting  pain, redness, or irritation at site where injected This list may not describe all possible side effects. Call your doctor for medical advice about side effects. You may report side effects to FDA at 1-800-FDA-1088. Where should I keep my medicine? Keep out of the reach of children. Store in a refrigerator between 2 and 8 degrees C (36 and 46 degrees F). Do not freeze or shake. Throw away any unused portion if using a single-dose vial. Multi-dose vials can be kept in the refrigerator for up to 21 days after the initial dose. Throw away unused medicine. NOTE: This sheet is a summary. It may not cover all possible information. If you have questions about this medicine, talk to your doctor, pharmacist, or health care provider.  2020 [employer]vier/Gold Standard (2016-09-19 08:35:19)

## 2019-12-16 NOTE — Patient Instructions (Signed)
-   continue current medication.  Notify provider if SBP < 90 > 140

## 2019-12-16 NOTE — Progress Notes (Signed)
Provider: Aiven Kampe FNP-C  Gayland Curry, DO  Patient Care Team: Gayland Curry, DO as PCP - General (Geriatric Medicine) Martinique, Peter M, MD as PCP - Cardiology (Cardiology) Larey Dresser, MD as PCP - Advanced Heart Failure (Cardiology) Elmarie Shiley, MD (Nephrology) Milus Banister, MD (Gastroenterology)  Extended Emergency Contact Information Primary Emergency Contact: downing,rosa Mobile Phone: [phone] Relation: Sister  Code Status:  Full Code  Goals of care: Advanced Directive information Advanced Directives 12/16/2019  Does Patient Have a Medical Advance Directive? Yes  Type of Advance Directive Out of facility DNR (pink MOST or yellow form)  Does patient want to make changes to medical advance directive? No - Guardian declined  Copy of  in Chart? -  Would patient like information on creating a medical advance directive? -  Pre-existing out of facility DNR order (yellow form or pink MOST form) -     Chief Complaint  Patient presents with   Acute Visit    [facility] , patient stated he is feeling dizzy today and has an unsteady gait . BP at earlier today was 123/76    HPI:  Pt is a 74 y.o. 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   He states saw Dr.Gregg today plans for ICD placement.He was instruct to call cardiologist office if ICD placement desired.He states plans to get it done in [place]. Hgb was 11.9 was done today by cardiologist previous 11.6 ; 11.7  He states once in a while he will feel dizzy.He has has a chronic history of dizziness.     Past Medical History:  Diagnosis Date   Anemia    Anxiety     Benign paroxysmal positional vertigo    Bipolar I disorder, most recent episode (or current) unspecified    Celiac disease    CHF (congestive heart failure) (HCC)    Chronic kidney disease, stage III (moderate) (HCC)    Crohn's    Remicade q8 weeks   Depression    Essential and other specified forms of tremor    medication-induced Parkinson's, now resolved   Essential hypertension, benign    Gout 2018   Heart failure ([place])    Hypertrophy of prostate without urinary obstruction and other lower urinary tract symptoms (LUTS)    Impotence of organic origin    Insomnia with sleep apnea, unspecified    Iron deficiency anemia, unspecified    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   Neuralgia, neuritis, and radiculitis, unspecified    Other B-complex deficiencies    Other extrapyramidal disease and abnormal movement disorder    Postinflammatory pulmonary fibrosis ([place])    Tobacco use disorder    Vertigo 2018   Past Surgical History:  Procedure Laterality Date   CATARACT EXTRACTION, BILATERAL Bilateral 09/2018   CHOLECYSTECTOMY  07-12-2010   RIGHT HEART CATH N/A 10/12/2018   Procedure: RIGHT HEART CATH;  Surgeon: Jolaine Artist, MD;  Location: [place] [facility];  Service: Cardiovascular;  Laterality: N/A;   RIGHT/LEFT HEART CATH AND CORONARY ANGIOGRAPHY N/A 04/26/2019   Procedure: RIGHT/LEFT HEART CATH AND CORONARY ANGIOGRAPHY;  Surgeon: Larey Dresser, MD;  Location: [place] [facility];  Service: Cardiovascular;  Laterality: N/A;   SMALL INTESTINE SURGERY     x 2    Allergies  Allergen Reactions   Azathioprine Other (See Comments)    REACTION: affected WBC "Almost died"   Ciprofloxacin Other (See Comments)    Unknown rxn   Levaquin [Levofloxacin In D5w] Other (See Comments)    Unknown rxn   Plendil [Felodipine] Other (See Comments)    Unknown rxn    Outpatient Encounter Medications as of 12/16/2019  Medication Sig   acetaminophen (TYLENOL) 160 MG/5ML  liquid Take 325 mg by mouth every 4 (four) hours as needed for fever.   allopurinol (ZYLOPRIM) 100 MG tablet Take 2 tablets (200 mg total) by mouth daily.   aspirin EC 81 MG tablet Take 1 tablet (81 mg total) by mouth daily.   Cholecalciferol (VITAMIN D) 125 MCG (5000 UT) CAPS Take 5,000 Units by mouth daily.    dapagliflozin propanediol (FARXIGA) 10 MG TABS tablet Take 1 tablet (10 mg total) by mouth daily before breakfast.   divalproex (DEPAKOTE ER) 500 MG 24 hr tablet Take 1,500 mg by mouth at bedtime.   epoetin alfa (PROCRIT) 24268 UNIT/ML injection Inject 10,000 Units into the skin every 30 (thirty) days.    ferrous sulfate 325 (65 FE) MG tablet Take 325 mg by mouth every Monday, Wednesday, and Friday.    gentamicin (GARAMYCIN) 0.3 % ophthalmic solution Place 1 drop into the left eye 3 (three) times daily.   hydrALAZINE (APRESOLINE) 25 MG tablet Take 1 tablet (25 mg total) by mouth 2 (two) times daily.   inFLIXimab (REMICADE) 100 MG injection Infuse Remicade IV schedule 1 61m/kg every 8 weeks Premedicate with Tylenol 500-6554mby mouth and Benadryl 25-5051my mouth prior to infusion. Last PPD was on 12/2009.    isosorbide mononitrate (IMDUR) 30 MG 24 hr tablet Take 1 tablet (30 mg total) by mouth daily.   ivabradine (CORLANOR) 5 MG TABS tablet Take 1 tablet (5 mg total) by mouth 2 (two) times daily with a meal.   loperamide (IMODIUM) 2 MG capsule Take 1 capsule (2 mg total) by mouth 4 (four) times daily as needed for diarrhea or loose stools.   magnesium oxide (MAG-OX) 400 MG tablet Take 2 tablets (800 mg total) by mouth in the morning, at noon, and at bedtime.   metoprolol succinate (TOPROL-XL) 25 MG 24 hr tablet Take 1 tablet (25 mg total) by mouth at bedtime.   omeprazole (PRILOSEC) 20 MG capsule Take 20 mg by mouth daily before breakfast.    tamsulosin (FLOMAX) 0.4 MG CAPS capsule Take 1 capsule (0.4 mg total) by mouth daily.   triamcinolone (NASACORT ALLERGY 24HR) 55  MCG/ACT AERO nasal inhaler Place 2 sprays into the nose daily as needed (allergies).   vitamin B-12 (CYANOCOBALAMIN) 1000 MCG tablet Take 1,000 mcg by mouth daily.   [DISCONTINUED] furosemide (LASIX) 20 MG tablet Take 20 mg by mouth daily.   [DISCONTINUED] OLANZapine (ZYPREXA) 7.5 MG tablet Take 1 tablet (7.5 mg total) by mouth at bedtime.   Facility-Administered Encounter Medications as of 12/16/2019  Medication   epoetin alfa (EPOGEN) injection 10,000 Units    Review of Systems  Constitutional: Positive for fatigue. Negative for appetite change, chills and fever.  HENT: Negative for congestion, sinus pressure, sinus pain, sneezing and sore throat.   Eyes: Positive for visual disturbance. Negative for discharge, redness and itching.  Respiratory: Negative for cough, chest tightness and wheezing.        Chronic shortness of  breath with exertion  Cardiovascular: Negative for chest pain, palpitations and leg swelling.  Gastrointestinal: Negative for abdominal distention, abdominal pain, constipation, diarrhea, nausea and vomiting.  Endocrine: Negative for cold intolerance, heat intolerance, polydipsia, polyphagia and polyuria.  Genitourinary: Negative for difficulty urinating, dysuria, flank pain, frequency and urgency.  Musculoskeletal: Positive for gait problem. Negative for joint swelling and myalgias.  Skin: Negative for color change, pallor and rash.  Neurological: Negative for speech difficulty, numbness and headaches.  Hematological: Does not bruise/bleed easily.  Psychiatric/Behavioral: Negative for agitation, behavioral problems and sleep disturbance. The patient is not nervous/anxious.     Immunization History  Administered Date(s) Administered   Fluad Quad(high Dose 65+) 11/15/2018   Influenza, High Dose Seasonal PF 11/26/2016   Influenza,inj,Quad PF,6+ Mos 11/13/2014, 01/03/2016   Influenza-Unspecified 10/25/2012, 11/24/2016, 11/24/2017   Moderna SARS-COVID-2  Vaccination 04/20/2019, 05/18/2019, 10/25/2019   PPD Test 12/30/2010, 01/05/2012, 01/07/2013, 01/13/2014   Pneumococcal Conjugate-13 08/10/2014   Pneumococcal Polysaccharide-23 03/05/2004, 01/03/2016, 05/06/2018   Tdap 05/26/2011   Pertinent  Health Maintenance Due  Topic Date Due   INFLUENZA VACCINE  09/25/2019   HEMOGLOBIN A1C  02/27/2020   FOOT EXAM  08/09/2020   OPHTHALMOLOGY EXAM  10/12/2020   COLONOSCOPY  09/17/2027   PNA vac Low Risk Adult  Completed   Fall Risk  12/16/2019 12/05/2019 10/24/2019 10/20/2019 09/06/2019  Falls in the past year? - 0 - 0 0  Number falls in past yr: 1 0 - 0 0  Injury with Fall? 0 0 - 0 0  Risk for fall due to : - - History of fall(s);Impaired balance/gait;Impaired mobility;Medication side effect - -  Risk for fall due to: Comment - - - - -  Follow up - - Falls evaluation completed;Education provided;Falls prevention discussed - -   Functional Status Survey:    Vitals:   12/16/19 1407  BP: 118/72  Pulse: 88  Temp: 97.7 F (36.5 C)  TempSrc: Temporal  SpO2: 93%  Weight: 183 lb (83 kg)  Height: 6' (1.829 m)   Body mass index is 24.82 kg/m. Physical Exam Vitals reviewed.  Constitutional:      General: He is not in acute distress.    Appearance: He is not ill-appearing.  HENT:     Head: Normocephalic.     Right Ear: Tympanic membrane, ear canal and external ear normal. There is no impacted cerumen.     Left Ear: Tympanic membrane, ear canal and external ear normal. There is no impacted cerumen.     Nose: Rhinorrhea present. No congestion.     Mouth/Throat:     Mouth: Mucous membranes are moist.     Pharynx: No oropharyngeal exudate or posterior oropharyngeal erythema.     Comments: Left buccal small area redness tender to touch  Eyes:     General: No scleral icterus.       Right eye: No discharge.        Left eye: No discharge.     Extraocular Movements: Extraocular movements intact.     Conjunctiva/sclera: Conjunctivae  normal.     Pupils: Pupils are equal, round, and reactive to light.  Neck:     Vascular: No carotid bruit.  Cardiovascular:     Rate and Rhythm: Normal rate and regular rhythm.     Pulses: Normal pulses.     Heart sounds: Normal heart sounds. No murmur heard.  No gallop.   Pulmonary:     Effort: Pulmonary effort is normal. No respiratory distress.  Breath sounds: Normal breath sounds. No wheezing, rhonchi or rales.  Abdominal:     General: Bowel sounds are normal. There is no distension.     Palpations: Abdomen is soft. There is no mass.     Tenderness: There is no abdominal tenderness. There is no right CVA tenderness, left CVA tenderness, guarding or rebound.  Musculoskeletal:        General: No swelling or tenderness.     Cervical back: Normal range of motion. No rigidity or tenderness.     Right lower leg: No edema.     Left lower leg: No edema.     Comments: Unsteady gait   Lymphadenopathy:     Cervical: No cervical adenopathy.  Skin:    General: Skin is warm and dry.     Coloration: Skin is not pale.     Findings: No bruising, erythema or rash.  Neurological:     Mental Status: He is alert and oriented to person, place, and time.     Motor: Weakness present.     Gait: Gait abnormal.  Psychiatric:        Mood and Affect: Mood normal.        Behavior: Behavior normal.        Thought Content: Thought content normal.        Judgment: Judgment normal.    Labs reviewed: Recent Labs    08/27/19 0201 08/27/19 0201 08/28/19 0323 08/28/19 0323 09/06/19 1559 09/26/19 1416 10/20/19 1158 11/14/19 1413 12/08/19 1350  NA 140   < > 138   < > 141   < > 142 140 141  K 5.5*   < > 5.3*   < > 5.3   < > 5.7* 5.1 5.2*  CL 107   < > 103   < > 111*   < > 112* 108 106  CO2 25   < > 27   < > 22   < > 27 22 25   GLUCOSE 127*   < > 97   < > 83   < > 78 112* 91  BUN 36*   < > 34*   < > 31*   < > 27* 22 38*  CREATININE 2.52*   < > 2.57*  --  2.16*   < > 2.08* 2.22* 2.38*  CALCIUM  8.4*   < > 8.6*   < > 8.8   < > 9.2 8.7* 9.1  MG 1.8  --  1.6*  --  1.8  --   --   --   --   PHOS 4.4  --  3.9  --   --   --   --   --   --    < > = values in this interval not displayed.   Recent Labs    08/27/19 0201 08/27/19 0201 08/28/19 0323 08/28/19 0323 09/06/19 1559 09/28/19 0946 10/20/19 1158  AST 11*   < > 11*   < > 11 8* 11  ALT 9   < > 10   < > 7* 5* 8*  ALKPHOS 56  --  57  --   --   --   --   BILITOT 0.4   < > 0.4   < > 0.4 0.3 0.3  PROT 5.6*   < > 6.2*   < > 6.4 6.7 6.7  ALBUMIN 2.8*  --  3.0*  --   --   --   --    < > =  values in this interval not displayed.   Recent Labs    09/06/19 1559 09/16/19 0959 09/28/19 0946 10/17/19 1043 10/20/19 1158 11/17/19 1020 12/16/19 0925  WBC 9.0  --  10.7  --  8.1  --   --   NEUTROABS 4,815  --  7,586  --  4,820  --   --   HGB 10.3*   < > 11.3*   < > 11.7* 11.6* 11.9*  HCT 31.2*   < > 34.3*   < > 36.5* 37.2* 39.1  MCV 101.3*  --  100.6*  --  102.2*  --   --   PLT 160  --  148  --  136*  --   --    < > = values in this interval not displayed.   Lab Results  Component Value Date   TSH 2.46 10/20/2019   Lab Results  Component Value Date   HGBA1C 5.1 08/27/2019   Lab Results  Component Value Date   CHOL 161 10/20/2019   HDL 54 10/20/2019   LDLCALC 86 10/20/2019   TRIG 112 10/20/2019   CHOLHDL 3.0 10/20/2019    Significant Diagnostic Results in last 30 days:  ECHOCARDIOGRAM COMPLETE  Result Date: 11/24/2019    ECHOCARDIOGRAM REPORT   Patient Name:   JOSEMARIA BRINING Matherne Date of Exam: 11/24/2019 Medical Rec #:  [id number]    Height:       76.0 in Accession #:    [id number]   Weight:       177.0 lb Date of Birth:  11/14/1945    BSA:          2.103 m Patient Age:    39 years     BP:           150/68 mmHg Patient Gender: M            HR:           68 bpm. Exam Location:  Outpatient Procedure: 2D Echo Indications:    CHF 428  History:        Patient has prior history of Echocardiogram examinations, most                 recent  09/29/2018. CHF; Risk Factors:Hypertension. Tobacco use                 disorder.  Sonographer:    Jannett Celestine RDCS (AE) Referring Phys: [phone] AMY D CLEGG IMPRESSIONS  1. Left ventricular ejection fraction, by estimation, is 25 to 30%. Left ventricular ejection fraction by PLAX is 24 %. The left ventricle has severely decreased function. The left ventricle demonstrates global hypokinesis. Indeterminate diastolic filling due to E-A fusion.  2. Right ventricular systolic function is normal. The right ventricular size is normal.  3. The mitral valve is normal in structure. No evidence of mitral valve regurgitation.  4. The aortic valve was not well visualized. Aortic valve regurgitation is not visualized. No aortic stenosis is present. Comparison(s): No significant change from prior study. FINDINGS  Left Ventricle: Left ventricular ejection fraction, by estimation, is 25 to 30%. Left ventricular ejection fraction by PLAX is 24 %. The left ventricle has severely decreased function. The left ventricle demonstrates global hypokinesis. The left ventricular internal cavity size was normal in size. There is no left ventricular hypertrophy. Indeterminate diastolic filling due to E-A fusion.  LV Wall Scoring: The apical lateral segment is akinetic. The entire anterior wall, antero-lateral wall, entire septum, entire inferior wall, posterior  wall, and apex are hypokinetic. Right Ventricle: The right ventricular size is normal. No increase in right ventricular wall thickness. Right ventricular systolic function is normal. Left Atrium: Left atrial size was normal in size. Right Atrium: Right atrial size was normal in size. Pericardium: There is no evidence of pericardial effusion. Mitral Valve: The mitral valve is normal in structure. No evidence of mitral valve regurgitation. Tricuspid Valve: The tricuspid valve is normal in structure. Tricuspid valve regurgitation is not demonstrated. Aortic Valve: The aortic valve was not  well visualized. Aortic valve regurgitation is not visualized. No aortic stenosis is present. Pulmonic Valve: The pulmonic valve was not well visualized. Pulmonic valve regurgitation is not visualized. Aorta: The aortic root was not well visualized. IAS/Shunts: The interatrial septum was not assessed.  LEFT VENTRICLE PLAX 2D LV EF:         Left            Diastology                ventricular     LV e' medial:  3.81 cm/s                ejection        LV e' lateral: 3.92 cm/s                fraction by                PLAX is 24                %. LVIDd:         5.40 cm LVIDs:         4.80 cm LV PW:         1.00 cm LV IVS:        0.80 cm LVOT diam:     2.40 cm LV SV:         82 LV SV Index:   39 LVOT Area:     4.52 cm  RIGHT VENTRICLE RV S prime:     13.60 cm/s TAPSE (M-mode): 2.1 cm LEFT ATRIUM             Index LA diam:        3.70 cm 1.76 cm/m LA Vol (A2C):   36.7 ml 17.45 ml/m LA Vol (A4C):   25.6 ml 12.17 ml/m LA Biplane Vol: 31.1 ml 14.79 ml/m  AORTIC VALVE LVOT Vmax:   79.00 cm/s LVOT Vmean:  55.400 cm/s LVOT VTI:    0.181 m  AORTA Ao Root diam: 4.30 cm  SHUNTS Systemic VTI:  0.18 m Systemic Diam: 2.40 cm Rudean Haskell MD Electronically signed by Rudean Haskell MD Signature Date/Time: 11/24/2019/2:46:57 PM    Final     Assessment/Plan 1. Essential hypertension, benign B/p well controlled.B/p was low on Wednesday during visit at the [facility] was advised to follow up with PCP.B/p within normal range today here and at the Cardiologist too. Will continue current medication for now. - advised to check B/p at home and Notify provider if SBP < 90 or > 140   2. Chronic systolic CHF (congestive heart failure) (HCC) No signs of fluid overload. - continue weight check at home. - continue to follow up with cardiologist.  3. Dizziness Chronic.encouraged fluid per cardiologist recommendation. - Fall and safety precaution advised.   4. Sore in mouth Small area on left inner buccal red  and tender to touch.no drainage noted.Possible bit himself with dentures  - advised  to switch with warm water and salt  - Notify provider if not resolved or fail to improve.  Family/ staff Communication: Reviewed plan of care with patient  Labs/tests ordered: None   Next Appointment: As needed if symptoms worsen or fail to improve.   Sandrea Hughs, NP

## 2019-12-19 NOTE — Telephone Encounter (Signed)
Returned call to pt.  Pt scheduled for ICD on 01/02/2020  Will come for labs/covid test on 11/5  Will meet with Pt on 11/5 to give instructions/soap  Work up complete

## 2019-12-21 ENCOUNTER — Other Ambulatory Visit (HOSPITAL_COMMUNITY): Payer: Self-pay

## 2019-12-21 NOTE — Progress Notes (Signed)
Paramedicine Encounter    Patient ID: Brent [name], male    DOB: 1945/11/30, 74 y.o.   MRN: [id number]   Patient Care Team: Gayland Curry, DO as PCP - General (Geriatric Medicine) Martinique, Peter M, MD as PCP - Cardiology (Cardiology) Larey Dresser, MD as PCP - Advanced Heart Failure (Cardiology) Elmarie Shiley, MD (Nephrology) Milus Banister, MD (Gastroenterology)  Patient Active Problem List   Diagnosis Date Noted  . Decreased appetite 10/08/2019  . Weakness 10/08/2019  . Hyperkalemia 08/26/2019  . Chronic systolic CHF (congestive heart failure) ([place]) 08/26/2019  . Type 2 diabetes mellitus with stage 3 chronic kidney disease ([place]) 08/26/2019  . Chronic obstructive pulmonary disease ([place]) 06/20/2019  . Ventricular tachycardia ([place]) 06/20/2019  . Acute on chronic systolic (congestive) heart failure ([place]) 04/25/2019  . Metabolic acidosis 28/63/8177  . Orthostasis 09/07/2018  . AKI (acute kidney injury) ([place])   . Immunosuppressed status ([place])   . Macrocytic anemia   . Chronic combined systolic and diastolic congestive heart failure ([place]) 06/03/2018  . Candida esophagitis ([place]) 09/22/2017  . History of smoking 30 or more pack years 01/05/2017  . Bipolar 1 disorder ([place])   . BPH (benign prostatic hyperplasia) 03/31/2013  . 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 Essential hypertension, benign 03/31/2013  . Acute renal failure superimposed on stage 3 chronic kidney disease ([place]) 06/04/2012  . 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[place]) 01/23/2010    Current Outpatient Medications:  .  acetaminophen (TYLENOL) 160 MG/5ML liquid, Take 325 mg by mouth every 4 (four) hours as needed for fever., Disp: , Rfl:  .  allopurinol (ZYLOPRIM) 100 MG tablet, Take 2 tablets (200 mg total) by mouth daily., Disp: 60 tablet, Rfl: 5 .  aspirin EC 81 MG tablet, Take 1 tablet (81 mg total) by mouth daily., Disp: 90 tablet, Rfl: 3 .  Cholecalciferol (VITAMIN D) 125 MCG (5000 UT) CAPS, Take 5,000 Units by  mouth daily. , Disp: , Rfl:  .  dapagliflozin propanediol (FARXIGA) 10 MG TABS tablet, Take 1 tablet (10 mg total) by mouth daily before breakfast., Disp: 30 tablet, Rfl: 6 .  divalproex (DEPAKOTE ER) 500 MG 24 hr tablet, Take 1,500 mg by mouth at bedtime., Disp: , Rfl:  .  epoetin alfa (PROCRIT) 11657 UNIT/ML injection, Inject 10,000 Units into the skin every 30 (thirty) days. , Disp: , Rfl:  .  ferrous sulfate 325 (65 FE) MG tablet, Take 325 mg by mouth every Monday, Wednesday, and Friday. , Disp: , Rfl:  .  gentamicin (GARAMYCIN) 0.3 % ophthalmic solution, Place 1 drop into the left eye 3 (three) times daily., Disp: , Rfl:  .  hydrALAZINE (APRESOLINE) 25 MG tablet, Take 1 tablet (25 mg total) by mouth 2 (two) times daily., Disp: 60 tablet, Rfl: 3 .  inFLIXimab (REMICADE) 100 MG injection, Infuse Remicade IV schedule 1 22m/kg every 8 weeks Premedicate with Tylenol 500-6581mby mouth and Benadryl 25-5028my mouth prior to infusion. Last PPD was on 12/2009. , Disp: 1 each, Rfl: 6 .  isosorbide mononitrate (IMDUR) 30 MG 24 hr tablet, Take 1 tablet (30 mg total) by mouth daily., Disp: 90 tablet, Rfl: 3 .  ivabradine (CORLANOR) 5 MG TABS tablet, Take 1 tablet (5 mg total) by mouth 2 (two) times daily with a meal., Disp: 180 tablet, Rfl: 3 .  loperamide (IMODIUM) 2 MG capsule, Take 1 capsule (2 mg total) by mouth 4 (four) times daily as needed for diarrhea or loose stools., Disp: 12  capsule, Rfl: 0 .  magnesium oxide (MAG-OX) 400 MG tablet, Take 2 tablets (800 mg total) by mouth in the morning, at noon, and at bedtime., Disp: 60 tablet, Rfl: 2 .  metoprolol succinate (TOPROL-XL) 25 MG 24 hr tablet, Take 1 tablet (25 mg total) by mouth at bedtime., Disp: 90 tablet, Rfl: 3 .  omeprazole (PRILOSEC) 20 MG capsule, Take 20 mg by mouth daily before breakfast. , Disp: , Rfl:  .  tamsulosin (FLOMAX) 0.4 MG CAPS capsule, Take 1 capsule (0.4 mg total) by mouth daily., Disp: 30 capsule, Rfl: 2 .  triamcinolone  (NASACORT ALLERGY 24HR) 55 MCG/ACT AERO nasal inhaler, Place 2 sprays into the nose daily as needed (allergies)., Disp: , Rfl:  .  vitamin B-12 (CYANOCOBALAMIN) 1000 MCG tablet, Take 1,000 mcg by mouth daily., Disp: , Rfl:  Allergies  Allergen Reactions  . Azathioprine Other (See Comments)    REACTION: affected WBC "Almost died"  . Ciprofloxacin Other (See Comments)    Unknown rxn  . Levaquin [Levofloxacin In D5w] Other (See Comments)    Unknown rxn  . Plendil [Felodipine] Other (See Comments)    Unknown rxn     Social History   Socioeconomic History  . Marital status: Divorced    Spouse name: Not on file  . Number of children: 1  . Years of education: 57  . Highest education level: Not on file  Occupational History  . Occupation: retired  . Occupation: Veteran  Tobacco Use  . Smoking status: Former Smoker    Packs/day: 1.00    Years: 49.00    Pack years: 49.00    Types: Cigarettes    Start date: 04/11/1956    Quit date: 04/17/2014    Years since quitting: 5.6  . Smokeless tobacco: Never Used  Vaping Use  . Vaping Use: Never used  Substance and Sexual Activity  . Alcohol use: No    Alcohol/week: 0.0 standard drinks  . Drug use: No  . Sexual activity: Never  Other Topics Concern  . Not on file  Social History Narrative  . Not on file   Social Determinants of Health   Financial Resource Strain:   . Difficulty of Paying Living Expenses: Not on file  Food Insecurity:   . Worried About Charity fundraiser in the Last Year: Not on file  . Ran Out of Food in the Last Year: Not on file  Transportation Needs:   . Lack of Transportation (Medical): Not on file  . Lack of Transportation (Non-Medical): Not on file  Physical Activity:   . Days of Exercise per Week: Not on file  . Minutes of Exercise per Session: Not on file  Stress:   . Feeling of Stress : Not on file  Social Connections:   . Frequency of Communication with Friends and Family: Not on file  . Frequency  of Social Gatherings with Friends and Family: Not on file  . Attends Religious Services: Not on file  . Active Member of Clubs or Organizations: Not on file  . Attends Archivist Meetings: Not on file  . Marital Status: Not on file  Intimate Partner Violence:   . Fear of Current or Ex-Partner: Not on file  . Emotionally Abused: Not on file  . Physically Abused: Not on file  . Sexually Abused: Not on file    Physical Exam Vitals reviewed.  Constitutional:      Appearance: He is normal weight.  HENT:  Head: Normocephalic.     Nose: Nose normal.     Mouth/Throat:     Mouth: Mucous membranes are moist.     Pharynx: Oropharynx is clear.  Eyes:     Pupils: Pupils are equal, round, and reactive to light.  Cardiovascular:     Rate and Rhythm: Normal rate and regular rhythm.     Pulses: Normal pulses.     Heart sounds: Normal heart sounds.  Pulmonary:     Effort: Pulmonary effort is normal.     Breath sounds: Normal breath sounds.  Abdominal:     General: Abdomen is flat.     Palpations: Abdomen is soft.  Musculoskeletal:        General: Normal range of motion.     Cervical back: Normal range of motion.     Right lower leg: No edema.     Left lower leg: No edema.  Skin:    General: Skin is warm and dry.     Capillary Refill: Capillary refill takes less than 2 seconds.  Neurological:     General: No focal deficit present.     Mental Status: He is alert. Mental status is at baseline.  Psychiatric:        Mood and Affect: Mood normal.     Arrived for home visit for Brent [name] who was alert and oriented with no complaints today reporting he is feeling much better than last week. Vitals obtained as noted. BP improved this week. Patient's weight down 3lbs since last visit also. No swelling or edema noted. Lung sounds clear. Appointments reviewed with Brent [name] who agreed to all and understood. Medications were confirmed and pill box filled accordingly. Brent [name]  Foundation noted to be at a 0$ balance, I will discuss next visit if he can afford co-pay and if not we will place him on [employer] application. Home visit complete. I will see Brent [name] in one week.    Refills: -Flomax -Haldol     Future Appointments  Date Time Provider [place]  12/30/2019 11:00 AM [facility]  12/30/2019 11:40 AM [facility] None  01/16/2020 10:00 AM [facility] RM 1 [facility] None  01/24/2020  2:15 PM [name], [name], NP [facility] None  02/23/2020 11:00 AM Mariea Clonts, Tiffany L, DO [facility] None  02/27/2020  1:40 PM Larey Dresser, MD [facility] None     ACTION: Home visit completed Next visit planned for one week

## 2019-12-28 ENCOUNTER — Other Ambulatory Visit (HOSPITAL_COMMUNITY): Payer: Self-pay

## 2019-12-28 NOTE — Progress Notes (Signed)
Paramedicine Encounter    Patient ID: Brent [name], male    DOB: May 29, 1945, 74 y.o.   MRN: [id number]   Patient Care Team: Gayland Curry, DO as PCP - General (Geriatric Medicine) Martinique, Peter M, MD as PCP - Cardiology (Cardiology) Larey Dresser, MD as PCP - Advanced Heart Failure (Cardiology) Elmarie Shiley, MD (Nephrology) Milus Banister, MD (Gastroenterology)  Patient Active Problem List   Diagnosis Date Noted  . Decreased appetite 10/08/2019  . Weakness 10/08/2019  . Hyperkalemia 08/26/2019  . Chronic systolic CHF (congestive heart failure) ([place]) 08/26/2019  . Type 2 diabetes mellitus with stage 3 chronic kidney disease ([place]) 08/26/2019  . Chronic obstructive pulmonary disease ([place]) 06/20/2019  . Ventricular tachycardia ([place]) 06/20/2019  . Acute on chronic systolic (congestive) heart failure ([place]) 04/25/2019  . Metabolic acidosis 50/10/3816  . Orthostasis 09/07/2018  . AKI (acute kidney injury) ([place])   . Immunosuppressed status ([place])   . Macrocytic anemia   . Chronic combined systolic and diastolic congestive heart failure ([place]) 06/03/2018  . Candida esophagitis ([place]) 09/22/2017  . History of smoking 30 or more pack years 01/05/2017  . Bipolar 1 disorder ([place])   . BPH (benign prostatic hyperplasia) 03/31/2013  . 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 Essential hypertension, benign 03/31/2013  . Acute renal failure superimposed on stage 3 chronic kidney disease ([place]) 06/04/2012  . 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[place]) 01/23/2010    Current Outpatient Medications:  .  acetaminophen (TYLENOL) 160 MG/5ML liquid, Take 325 mg by mouth every 4 (four) hours as needed for fever., Disp: , Rfl:  .  allopurinol (ZYLOPRIM) 100 MG tablet, Take 2 tablets (200 mg total) by mouth daily., Disp: 60 tablet, Rfl: 5 .  aspirin EC 81 MG tablet, Take 1 tablet (81 mg total) by mouth daily., Disp: 90 tablet, Rfl: 3 .  Cholecalciferol (VITAMIN D) 125 MCG (5000 UT) CAPS, Take 5,000 Units by  mouth daily. , Disp: , Rfl:  .  dapagliflozin propanediol (FARXIGA) 10 MG TABS tablet, Take 1 tablet (10 mg total) by mouth daily before breakfast., Disp: 30 tablet, Rfl: 6 .  divalproex (DEPAKOTE ER) 500 MG 24 hr tablet, Take 1,500 mg by mouth at bedtime., Disp: , Rfl:  .  epoetin alfa (PROCRIT) 29937 UNIT/ML injection, Inject 10,000 Units into the skin every 30 (thirty) days. , Disp: , Rfl:  .  ferrous sulfate 325 (65 FE) MG tablet, Take 325 mg by mouth every Monday, Wednesday, and Friday. , Disp: , Rfl:  .  gentamicin (GARAMYCIN) 0.3 % ophthalmic solution, Place 1 drop into the left eye 3 (three) times daily., Disp: , Rfl:  .  hydrALAZINE (APRESOLINE) 25 MG tablet, Take 1 tablet (25 mg total) by mouth 2 (two) times daily., Disp: 60 tablet, Rfl: 3 .  inFLIXimab (REMICADE) 100 MG injection, Infuse Remicade IV schedule 1 38m/kg every 8 weeks Premedicate with Tylenol 500-654mby mouth and Benadryl 25-5059my mouth prior to infusion. Last PPD was on 12/2009. , Disp: 1 each, Rfl: 6 .  isosorbide mononitrate (IMDUR) 30 MG 24 hr tablet, Take 1 tablet (30 mg total) by mouth daily., Disp: 90 tablet, Rfl: 3 .  ivabradine (CORLANOR) 5 MG TABS tablet, Take 1 tablet (5 mg total) by mouth 2 (two) times daily with a meal., Disp: 180 tablet, Rfl: 3 .  loperamide (IMODIUM) 2 MG capsule, Take 1 capsule (2 mg total) by mouth 4 (four) times daily as needed for diarrhea or loose stools., Disp: 12  capsule, Rfl: 0 .  magnesium oxide (MAG-OX) 400 MG tablet, Take 2 tablets (800 mg total) by mouth in the morning, at noon, and at bedtime., Disp: 60 tablet, Rfl: 2 .  metoprolol succinate (TOPROL-XL) 25 MG 24 hr tablet, Take 1 tablet (25 mg total) by mouth at bedtime., Disp: 90 tablet, Rfl: 3 .  omeprazole (PRILOSEC) 20 MG capsule, Take 20 mg by mouth daily before breakfast. , Disp: , Rfl:  .  tamsulosin (FLOMAX) 0.4 MG CAPS capsule, Take 1 capsule (0.4 mg total) by mouth daily., Disp: 30 capsule, Rfl: 2 .  triamcinolone  (NASACORT ALLERGY 24HR) 55 MCG/ACT AERO nasal inhaler, Place 2 sprays into the nose daily as needed (allergies)., Disp: , Rfl:  .  vitamin B-12 (CYANOCOBALAMIN) 1000 MCG tablet, Take 1,000 mcg by mouth daily., Disp: , Rfl:  Allergies  Allergen Reactions  . Azathioprine Other (See Comments)    REACTION: affected WBC "Almost died"  . Ciprofloxacin Other (See Comments)    Unknown rxn  . Levaquin [Levofloxacin In D5w] Other (See Comments)    Unknown rxn  . Plendil [Felodipine] Other (See Comments)    Unknown rxn     Social History   Socioeconomic History  . Marital status: Divorced    Spouse name: Not on file  . Number of children: 1  . Years of education: 48  . Highest education level: Not on file  Occupational History  . Occupation: retired  . Occupation: Veteran  Tobacco Use  . Smoking status: Former Smoker    Packs/day: 1.00    Years: 49.00    Pack years: 49.00    Types: Cigarettes    Start date: 04/11/1956    Quit date: 04/17/2014    Years since quitting: 5.7  . Smokeless tobacco: Never Used  Vaping Use  . Vaping Use: Never used  Substance and Sexual Activity  . Alcohol use: No    Alcohol/week: 0.0 standard drinks  . Drug use: No  . Sexual activity: Never  Other Topics Concern  . Not on file  Social History Narrative  . Not on file   Social Determinants of Health   Financial Resource Strain:   . Difficulty of Paying Living Expenses: Not on file  Food Insecurity:   . Worried About Charity fundraiser in the Last Year: Not on file  . Ran Out of Food in the Last Year: Not on file  Transportation Needs:   . Lack of Transportation (Medical): Not on file  . Lack of Transportation (Non-Medical): Not on file  Physical Activity:   . Days of Exercise per Week: Not on file  . Minutes of Exercise per Session: Not on file  Stress:   . Feeling of Stress : Not on file  Social Connections:   . Frequency of Communication with Friends and Family: Not on file  . Frequency  of Social Gatherings with Friends and Family: Not on file  . Attends Religious Services: Not on file  . Active Member of Clubs or Organizations: Not on file  . Attends Archivist Meetings: Not on file  . Marital Status: Not on file  Intimate Partner Violence:   . Fear of Current or Ex-Partner: Not on file  . Emotionally Abused: Not on file  . Physically Abused: Not on file  . Sexually Abused: Not on file    Physical Exam Vitals reviewed.  Constitutional:      Appearance: He is normal weight.  HENT:  Head: Normocephalic.     Nose: Nose normal.     Mouth/Throat:     Mouth: Mucous membranes are moist.     Pharynx: Oropharynx is clear.  Eyes:     Pupils: Pupils are equal, round, and reactive to light.  Cardiovascular:     Rate and Rhythm: Normal rate and regular rhythm.     Pulses: Normal pulses.     Heart sounds: Normal heart sounds.  Pulmonary:     Effort: Pulmonary effort is normal.     Breath sounds: Normal breath sounds.  Abdominal:     General: Abdomen is flat.     Palpations: Abdomen is soft.  Musculoskeletal:        General: Swelling present. Normal range of motion.     Cervical back: Normal range of motion.     Right lower leg: Edema present.     Left lower leg: No edema.  Skin:    General: Skin is warm and dry.     Capillary Refill: Capillary refill takes less than 2 seconds.  Neurological:     Mental Status: He is alert. Mental status is at baseline.  Psychiatric:        Mood and Affect: Mood normal.    Arrived for home visit for [name] who was alert and oriented seated in his living room with no complaints reporting feeling good today. Ruhaan was compliant with his medications over the last week. Vitals were obtained and are as noted. Maksym reports a visit with the [place] [facility] today at the [facility] in [place] at 1215. Medications were reviewed and confirmed. 2 pill boxes were filled accordingly. Ladarion had some swelling to the right lower leg.  Giovonnie reports eating some fried salty foods for dinner over the last few nights. I advised [name] to avoid same. He verbalized agreement. We reviewed appointments and wrote out instructions for upcoming procedure. Travarius agreed to all. Home visit complete. I will see Demetres in two weeks.   Refills: Haldol      Future Appointments  Date Time Provider [place]  12/30/2019 11:00 AM [facility]  12/30/2019 11:40 AM [facility] None  01/12/2020 12:00 PM [facility] DEVICE 1 [facility]  01/16/2020 10:00 AM [facility] RM 1 [facility] None  01/24/2020  2:15 PM [name], [name], NP [facility] None  02/23/2020 11:00 AM Mariea Clonts, Tiffany L, DO [facility] None  02/27/2020  1:40 PM Larey Dresser, MD [facility] None  04/12/2020  2:15 PM Evans Lance, MD [facility]     ACTION: Home visit completed Next visit planned for two weeks

## 2019-12-30 ENCOUNTER — Other Ambulatory Visit: Payer: Self-pay

## 2019-12-30 ENCOUNTER — Other Ambulatory Visit (HOSPITAL_COMMUNITY)
Admission: RE | Admit: 2019-12-30 | Discharge: 2019-12-30 | Disposition: A | Discharge status: Home / Self Care | Payer: Medicare Other | Source: Ambulatory Visit | Attending: Internal Medicine | Admitting: Internal Medicine

## 2019-12-30 ENCOUNTER — Other Ambulatory Visit: Payer: Medicare Other | Admitting: *Deleted

## 2019-12-30 DIAGNOSIS — Z01812 Encounter for preprocedural laboratory examination: Secondary | ICD-10-CM | POA: Insufficient documentation

## 2019-12-30 DIAGNOSIS — I5022 Chronic systolic (congestive) heart failure: Secondary | ICD-10-CM

## 2019-12-30 DIAGNOSIS — Z20822 Contact with and (suspected) exposure to covid-19: Secondary | ICD-10-CM | POA: Diagnosis not present

## 2019-12-30 LAB — CBC WITH DIFFERENTIAL/PLATELET
Basophils Absolute: 0 x10E3/uL (ref 0.0–0.2)
Basos: 0 %
EOS (ABSOLUTE): 0.2 x10E3/uL (ref 0.0–0.4)
Eos: 2 %
Hematocrit: 37.1 % — ABNORMAL LOW (ref 37.5–51.0)
Hemoglobin: 12.2 g/dL — ABNORMAL LOW (ref 13.0–17.7)
Immature Grans (Abs): 0.1 x10E3/uL (ref 0.0–0.1)
Immature Granulocytes: 1 %
Lymphocytes Absolute: 3.5 x10E3/uL — ABNORMAL HIGH (ref 0.7–3.1)
Lymphs: 33 %
MCH: 33.1 pg — ABNORMAL HIGH (ref 26.6–33.0)
MCHC: 32.9 g/dL (ref 31.5–35.7)
MCV: 101 fL — ABNORMAL HIGH (ref 79–97)
Monocytes Absolute: 0.8 x10E3/uL (ref 0.1–0.9)
Monocytes: 7 %
Neutrophils Absolute: 6.1 x10E3/uL (ref 1.4–7.0)
Neutrophils: 57 %
Platelets: 177 x10E3/uL (ref 150–450)
RBC: 3.69 x10E6/uL — ABNORMAL LOW (ref 4.14–5.80)
RDW: 13 % (ref 11.6–15.4)
WBC: 10.7 x10E3/uL (ref 3.4–10.8)

## 2019-12-30 LAB — BASIC METABOLIC PANEL
BUN/Creatinine Ratio: 12 (ref 10–24)
BUN: 31 mg/dL — ABNORMAL HIGH (ref 8–27)
CO2: 19 mmol/L — ABNORMAL LOW (ref 20–29)
Calcium: 8.9 mg/dL (ref 8.6–10.2)
Chloride: 106 mmol/L (ref 96–106)
Creatinine, Ser: 2.62 mg/dL — ABNORMAL HIGH (ref 0.76–1.27)
GFR calc Af Amer: 27 mL/min/{1.73_m2} — ABNORMAL LOW (ref >59)
GFR calc non Af Amer: 23 mL/min/{1.73_m2} — ABNORMAL LOW (ref >59)
Glucose: 112 mg/dL — ABNORMAL HIGH (ref 65–99)
Potassium: 4.3 mmol/L (ref 3.5–5.2)
Sodium: 140 mmol/L (ref 134–144)

## 2019-12-30 LAB — SARS CORONAVIRUS 2 (TAT 6-24 HRS): SARS Coronavirus 2: NEGATIVE

## 2019-12-30 NOTE — Progress Notes (Signed)
Instructed patient on the following items: Arrival time 1030  Nothing to eat or drink after midnight No meds AM of procedure Responsible person to drive you home and stay with you for 24 hrs Wash with special soap night before and morning of procedure 

## 2020-01-02 ENCOUNTER — Other Ambulatory Visit: Payer: Self-pay

## 2020-01-02 ENCOUNTER — Ambulatory Visit (HOSPITAL_COMMUNITY)
Admission: RE | Disposition: A | Discharge status: Home / Self Care | Payer: Self-pay | Source: Home / Self Care | Attending: Internal Medicine

## 2020-01-02 ENCOUNTER — Ambulatory Visit (HOSPITAL_COMMUNITY)
Admission: RE | Admit: 2020-01-02 | Discharge: 2020-01-03 | Disposition: A | Discharge status: Home / Self Care | Payer: Medicare Other | Attending: Internal Medicine | Admitting: Internal Medicine

## 2020-01-02 DIAGNOSIS — Z7984 Long term (current) use of oral hypoglycemic drugs: Secondary | ICD-10-CM | POA: Insufficient documentation

## 2020-01-02 DIAGNOSIS — Z950 Presence of cardiac pacemaker: Secondary | ICD-10-CM

## 2020-01-02 DIAGNOSIS — I130 Hypertensive heart and chronic kidney disease with heart failure and stage 1 through stage 4 chronic kidney disease, or unspecified chronic kidney disease: Secondary | ICD-10-CM | POA: Insufficient documentation

## 2020-01-02 DIAGNOSIS — Z87891 Personal history of nicotine dependence: Secondary | ICD-10-CM | POA: Insufficient documentation

## 2020-01-02 DIAGNOSIS — Z881 Allergy status to other antibiotic agents status: Secondary | ICD-10-CM | POA: Insufficient documentation

## 2020-01-02 DIAGNOSIS — Z79899 Other long term (current) drug therapy: Secondary | ICD-10-CM | POA: Diagnosis not present

## 2020-01-02 DIAGNOSIS — Z7982 Long term (current) use of aspirin: Secondary | ICD-10-CM | POA: Diagnosis not present

## 2020-01-02 DIAGNOSIS — I428 Other cardiomyopathies: Secondary | ICD-10-CM | POA: Diagnosis not present

## 2020-01-02 DIAGNOSIS — Z006 Encounter for examination for normal comparison and control in clinical research program: Secondary | ICD-10-CM | POA: Diagnosis not present

## 2020-01-02 DIAGNOSIS — N184 Chronic kidney disease, stage 4 (severe): Secondary | ICD-10-CM | POA: Diagnosis not present

## 2020-01-02 DIAGNOSIS — Z833 Family history of diabetes mellitus: Secondary | ICD-10-CM | POA: Insufficient documentation

## 2020-01-02 DIAGNOSIS — I5022 Chronic systolic (congestive) heart failure: Secondary | ICD-10-CM | POA: Diagnosis not present

## 2020-01-02 DIAGNOSIS — Z888 Allergy status to other drugs, medicaments and biological substances status: Secondary | ICD-10-CM | POA: Diagnosis not present

## 2020-01-02 HISTORY — PX: ICD IMPLANT: EP1208

## 2020-01-02 SURGERY — ICD IMPLANT

## 2020-01-02 MED ORDER — CEFAZOLIN SODIUM-DEXTROSE 1-4 GM/50ML-% IV SOLN
1.0000 g | Freq: Four times a day (QID) | INTRAVENOUS | Status: AC
  Administered 2020-01-02 – 2020-01-03 (×3): 1 g via INTRAVENOUS
  Filled 2020-01-02 (×3): qty 50

## 2020-01-02 MED ORDER — LIDOCAINE HCL 1 % IJ SOLN
INTRAMUSCULAR | Status: AC
  Filled 2020-01-02: qty 60

## 2020-01-02 MED ORDER — SODIUM CHLORIDE 0.9 % IV SOLN: INTRAVENOUS | Status: DC

## 2020-01-02 MED ORDER — DIVALPROEX SODIUM ER 500 MG PO TB24
1500.0000 mg | ORAL_TABLET | Freq: Every day | ORAL | Status: DC
  Administered 2020-01-02: 1500 mg via ORAL
  Filled 2020-01-02: qty 3

## 2020-01-02 MED ORDER — CEFAZOLIN SODIUM-DEXTROSE 2-3 GM-%(50ML) IV SOLR
INTRAVENOUS | Status: AC | PRN
  Administered 2020-01-02: 2 g via INTRAVENOUS

## 2020-01-02 MED ORDER — METOPROLOL SUCCINATE ER 25 MG PO TB24
25.0000 mg | ORAL_TABLET | Freq: Every day | ORAL | Status: DC
  Administered 2020-01-02: 25 mg via ORAL
  Filled 2020-01-02: qty 1

## 2020-01-02 MED ORDER — FUROSEMIDE 20 MG PO TABS
20.0000 mg | ORAL_TABLET | ORAL | Status: DC
  Filled 2020-01-02: qty 1

## 2020-01-02 MED ORDER — HEPARIN (PORCINE) IN NACL 1000-0.9 UT/500ML-% IV SOLN
INTRAVENOUS | Status: DC | PRN
  Administered 2020-01-02: 500 mL

## 2020-01-02 MED ORDER — HEPARIN (PORCINE) IN NACL 1000-0.9 UT/500ML-% IV SOLN
INTRAVENOUS | Status: AC
  Filled 2020-01-02: qty 500

## 2020-01-02 MED ORDER — ISOSORBIDE MONONITRATE ER 30 MG PO TB24
30.0000 mg | ORAL_TABLET | Freq: Every day | ORAL | Status: DC
  Administered 2020-01-03: 30 mg via ORAL
  Filled 2020-01-02: qty 1

## 2020-01-02 MED ORDER — SODIUM CHLORIDE 0.9 % IV SOLN
INTRAVENOUS | Status: AC
  Filled 2020-01-02: qty 2

## 2020-01-02 MED ORDER — IOHEXOL 350 MG/ML SOLN
INTRAVENOUS | Status: DC | PRN
  Administered 2020-01-02: 10 mL
  Administered 2020-01-02: 15 mL

## 2020-01-02 MED ORDER — MIDAZOLAM HCL 5 MG/5ML IJ SOLN
INTRAMUSCULAR | Status: DC | PRN
  Administered 2020-01-02: 2 mg via INTRAVENOUS

## 2020-01-02 MED ORDER — CEFAZOLIN SODIUM-DEXTROSE 2-4 GM/100ML-% IV SOLN: 2.0000 g | INTRAVENOUS | Status: DC

## 2020-01-02 MED ORDER — FENTANYL CITRATE (PF) 100 MCG/2ML IJ SOLN
INTRAMUSCULAR | Status: AC
  Filled 2020-01-02: qty 2

## 2020-01-02 MED ORDER — VITAMIN B-12 1000 MCG PO TABS
1000.0000 ug | ORAL_TABLET | Freq: Every day | ORAL | Status: DC
  Administered 2020-01-03: 1000 ug via ORAL
  Filled 2020-01-02: qty 1

## 2020-01-02 MED ORDER — LIDOCAINE HCL (PF) 1 % IJ SOLN
INTRAMUSCULAR | Status: DC | PRN
  Administered 2020-01-02: 60 mL

## 2020-01-02 MED ORDER — LOPERAMIDE HCL 2 MG PO CAPS: 2.0000 mg | ORAL_CAPSULE | Freq: Four times a day (QID) | ORAL | Status: DC | PRN

## 2020-01-02 MED ORDER — ACETAMINOPHEN 325 MG PO TABS
325.0000 mg | ORAL_TABLET | ORAL | Status: DC | PRN
  Administered 2020-01-02: 650 mg via ORAL
  Filled 2020-01-02: qty 2

## 2020-01-02 MED ORDER — ASPIRIN EC 81 MG PO TBEC
81.0000 mg | DELAYED_RELEASE_TABLET | Freq: Every day | ORAL | Status: DC
  Administered 2020-01-03: 81 mg via ORAL
  Filled 2020-01-02: qty 1

## 2020-01-02 MED ORDER — ONDANSETRON HCL 4 MG/2ML IJ SOLN: 4.0000 mg | Freq: Four times a day (QID) | INTRAMUSCULAR | Status: DC | PRN

## 2020-01-02 MED ORDER — MIDAZOLAM HCL 5 MG/5ML IJ SOLN
INTRAMUSCULAR | Status: AC
  Filled 2020-01-02: qty 5

## 2020-01-02 MED ORDER — VITAMIN D 25 MCG (1000 UNIT) PO TABS
5000.0000 [IU] | ORAL_TABLET | Freq: Every day | ORAL | Status: DC
  Administered 2020-01-03: 5000 [IU] via ORAL
  Filled 2020-01-02: qty 5

## 2020-01-02 MED ORDER — DAPAGLIFLOZIN PROPANEDIOL 10 MG PO TABS
10.0000 mg | ORAL_TABLET | Freq: Every day | ORAL | Status: DC
  Administered 2020-01-03: 10 mg via ORAL
  Filled 2020-01-02: qty 1

## 2020-01-02 MED ORDER — TRIAMCINOLONE ACETONIDE 55 MCG/ACT NA AERO
2.0000 | INHALATION_SPRAY | Freq: Every day | NASAL | Status: DC | PRN
  Filled 2020-01-02: qty 21.6

## 2020-01-02 MED ORDER — CEFAZOLIN SODIUM-DEXTROSE 2-4 GM/100ML-% IV SOLN
INTRAVENOUS | Status: AC
  Filled 2020-01-02: qty 100

## 2020-01-02 MED ORDER — CHLORHEXIDINE GLUCONATE 4 % EX LIQD: 4.0000 "application " | Freq: Once | CUTANEOUS | Status: DC

## 2020-01-02 MED ORDER — SODIUM CHLORIDE 0.9 % IV SOLN
80.0000 mg | INTRAVENOUS | Status: AC
  Administered 2020-01-02: 80 mg

## 2020-01-02 MED ORDER — ACETAMINOPHEN 160 MG/5ML PO SUSP
325.0000 mg | ORAL | Status: DC | PRN
  Filled 2020-01-02: qty 10.2

## 2020-01-02 MED ORDER — IVABRADINE HCL 5 MG PO TABS
5.0000 mg | ORAL_TABLET | Freq: Two times a day (BID) | ORAL | Status: DC
  Administered 2020-01-03: 5 mg via ORAL
  Filled 2020-01-02 (×3): qty 1

## 2020-01-02 MED ORDER — TAMSULOSIN HCL 0.4 MG PO CAPS
0.4000 mg | ORAL_CAPSULE | Freq: Every day | ORAL | Status: DC
  Administered 2020-01-03: 0.4 mg via ORAL
  Filled 2020-01-02: qty 1

## 2020-01-02 MED ORDER — HYDRALAZINE HCL 25 MG PO TABS
25.0000 mg | ORAL_TABLET | Freq: Two times a day (BID) | ORAL | Status: DC
  Administered 2020-01-02 – 2020-01-03 (×2): 25 mg via ORAL
  Filled 2020-01-02 (×2): qty 1

## 2020-01-02 MED ORDER — FENTANYL CITRATE (PF) 100 MCG/2ML IJ SOLN
INTRAMUSCULAR | Status: DC | PRN
  Administered 2020-01-02: 25 ug via INTRAVENOUS

## 2020-01-02 SURGICAL SUPPLY — 9 items
CABLE SURGICAL S-101-97-12 (CABLE) ×3 IMPLANT
COVER DOME SNAP 22 D (MISCELLANEOUS) ×2 IMPLANT
GUIDEWIRE ANGLED .035X150CM (WIRE) ×2 IMPLANT
ICD VISIA MRI DVFB1D1 (ICD Generator) ×2 IMPLANT
KIT MICROPUNCTURE NIT STIFF (SHEATH) ×2 IMPLANT
LEAD SPRINT QUAT SEC 6935-65CM (Lead) ×2 IMPLANT
PAD PRO RADIOLUCENT 2001M-C (PAD) ×3 IMPLANT
SHEATH 9FR PRELUDE SNAP 13 (SHEATH) ×2 IMPLANT
TRAY PACEMAKER INSERTION (PACKS) ×3 IMPLANT

## 2020-01-02 NOTE — Discharge Summary (Addendum)
ELECTROPHYSIOLOGY PROCEDURE DISCHARGE SUMMARY    Patient ID: Brent [name],  MRN: [id number], DOB/AGE: 06-01-1945 74 y.o.  Admit date: 01/02/2020 Discharge date: 01/03/2020  Primary Care Physician: Gayland Curry, DO  Primary Cardiologist: Dr. [name] Electrophysiologist: Dr. Lovena Le  Primary Discharge Diagnosis:  1. NICM  Secondary Discharge Diagnosis:  1. Chronic CHF (systolic) 2. HTN 3. Chron's 4. Celiac disease 5. CKD (IIIb) 6. COPD  Allergies  Allergen Reactions   Azathioprine Other (See Comments)    REACTION: affected WBC "Almost died"   Ciprofloxacin Other (See Comments)    Unknown rxn   Levaquin [Levofloxacin In D5w] Other (See Comments)    Unknown rxn   Plendil [Felodipine] Other (See Comments)    Unknown rxn     Procedures This Admission:  1.  Implantation of a MDT single chamber ICD on 01/02/2020 by Dr Lovena Le.  The patient received a Medtronic (serial number [id number]) right ventricular defibrillator lead, Medtronic (serial  Number [id number]) ICD DFT's were deferred at time of implant There were no immediate post procedure complications. 2.  CXR on 01/03/2020 demonstrated no pneumothorax status post device implantation.   Brief HPI: Brent [name] is a 74 y.o. male was referred to electrophysiology in the outpatient setting for consideration of ICD implantation.  Past medical history includes above.  The patient has persistent LV dysfunction despite guideline directed therapy.  Noted that her blood pressure and renal disease have not allowed ACE.ARB therapy.  Risks, benefits, and alternatives to ICD implantation were reviewed with the patient who wished to proceed.   Hospital Course:  The patient was admitted and underwent implantation of an ICD with details as outlined above. He was monitored on telemetry overnight which demonstrated SR 70's-80's.  Left chest was without hematoma or ecchymosis.  The device was interrogated and found to be functioning  normally.  CXR was obtained and demonstrated no pneumothorax status post device implantation.  Wound care, arm mobility, and restrictions were reviewed with the patient.  The patient feels well, denies any CP or SOB, minimal site discomfort, He was examined by Dr. Lovena Le and considered stable for discharge to home.   The patient's discharge medications include an ACE/ARB (unable with CKD and hypotension) and beta blocker (metoprolol).   Physical Exam: Vitals:   01/02/20 2158 01/03/20 0114 01/03/20 0424 01/03/20 0850  BP: (!) 151/96 133/84 (!) 141/88 (!) 161/84  Pulse:  72 71 76  Resp: 16 16 16    Temp: 97.7 F (36.5 C) 98.3 F (36.8 C) 98.1 F (36.7 C)   TempSrc: Oral Oral Oral   SpO2: 97% 97% 98% 99%  Weight:   82.2 kg   Height:        GEN- The patient is well appearing, alert and oriented x 3 today.   HEENT: normocephalic, atraumatic; sclera clear, conjunctiva pink; hearing intact; oropharynx clear Lungs- CTA b/l, normal work of breathing.  No wheezes, rales, rhonchi Heart- RRR, no murmurs, rubs or gallops, PMI not laterally displaced GI- soft, non-tender, non-distended Extremities- no clubbing, cyanosis, or edema MS- no significant deformity or atrophy Skin- warm and dry, no rash or lesion, left chest without hematoma/ecchymosis Psych- euthymic mood, full affect Neuro- no gross defecits  Labs:   Lab Results  Component Value Date   WBC 10.7 12/30/2019   HGB 12.2 (L) 12/30/2019   HCT 37.1 (L) 12/30/2019   MCV 101 (H) 12/30/2019   PLT 177 12/30/2019    Recent Labs  Lab 12/30/19 1025  NA  140  K 4.3  CL 106  CO2 19*  BUN 31*  CREATININE 2.62*  CALCIUM 8.9  GLUCOSE 112*    Discharge Medications:  Allergies as of 01/03/2020       Reactions   Azathioprine Other (See Comments)   REACTION: affected WBC "Almost died"   Ciprofloxacin Other (See Comments)   Unknown rxn   Levaquin [levofloxacin In D5w] Other (See Comments)   Unknown rxn   Plendil [felodipine] Other  (See Comments)   Unknown rxn        Medication List     TAKE these medications    acetaminophen 160 MG/5ML liquid Commonly known as: TYLENOL Take 325 mg by mouth every 4 (four) hours as needed for fever.   allopurinol 100 MG tablet Commonly known as: ZYLOPRIM Take 2 tablets (200 mg total) by mouth daily. What changed: how much to take   aspirin EC 81 MG tablet Take 1 tablet (81 mg total) by mouth daily.   dapagliflozin propanediol 10 MG Tabs tablet Commonly known as: Farxiga Take 1 tablet (10 mg total) by mouth daily before breakfast.   Depakote ER 500 MG 24 hr tablet Generic drug: divalproex Take 1,500 mg by mouth at bedtime.   ferrous sulfate 325 (65 FE) MG tablet Take 325 mg by mouth in the morning and at bedtime.   furosemide 20 MG tablet Commonly known as: LASIX Take 20 mg by mouth every Monday, Wednesday, and Friday.   hydrALAZINE 25 MG tablet Commonly known as: APRESOLINE Take 1 tablet (25 mg total) by mouth 2 (two) times daily. What changed: when to take this   inFLIXimab 100 MG injection Commonly known as: Remicade Infuse Remicade IV schedule 1 71m/kg every 8 weeks Premedicate with Tylenol 500-6542mby mouth and Benadryl 25-5081my mouth prior to infusion. Last PPD was on 12/2009.   isosorbide mononitrate 30 MG 24 hr tablet Commonly known as: IMDUR Take 1 tablet (30 mg total) by mouth daily.   ivabradine 5 MG Tabs tablet Commonly known as: CORLANOR Take 1 tablet (5 mg total) by mouth 2 (two) times daily with a meal.   loperamide 2 MG capsule Commonly known as: IMODIUM Take 1 capsule (2 mg total) by mouth 4 (four) times daily as needed for diarrhea or loose stools.   magnesium oxide 400 MG tablet Commonly known as: MAG-OX Take 2 tablets (800 mg total) by mouth in the morning, at noon, and at bedtime.   metoprolol succinate 25 MG 24 hr tablet Commonly known as: TOPROL-XL Take 1 tablet (25 mg total) by mouth at bedtime.   Nasacort Allergy  24HR 55 MCG/ACT Aero nasal inhaler Generic drug: triamcinolone Place 2 sprays into the nose daily as needed (allergies).   omeprazole 20 MG capsule Commonly known as: PRILOSEC Take 20 mg by mouth at bedtime.   Procrit 10000 UNIT/ML injection Generic drug: epoetin alfa Inject 10,000 Units into the skin every 30 (thirty) days.   tamsulosin 0.4 MG Caps capsule Commonly known as: FLOMAX Take 1 capsule (0.4 mg total) by mouth daily.   vitamin B-12 500 MCG tablet Commonly known as: CYANOCOBALAMIN Take 1,000 mcg by mouth daily.   Vitamin D 125 MCG (5000 UT) Caps Take 5,000 Units by mouth daily.        Disposition:  Home Discharge Instructions     Diet - low sodium heart healthy   Complete by: As directed    Increase activity slowly   Complete by: As directed  Follow-up Information     [facility] Follow up.   Specialty: Cardiology Why: 01/12/2020 @ 12:00PM (noon), wound check visit Contact information: [address]        Evans Lance, MD Follow up.   Specialty: Cardiology Why: 04/12/2020 @ 2:15PM Contact information: [address] [address] [phone]                 Duration of Discharge Encounter: Greater than 30 minutes including physician time.  Venetia Night, PA-C 01/03/2020 9:51 AM  EP Attending Patient seen and examined. Agree with above. He is s/p ICD insertion and his device has been interrogated under my direction and working normally. He will be discharged home with usual followup.  Carleene Overlie Aidenjames Heckmann,MD

## 2020-01-02 NOTE — Interval H&P Note (Signed)
History and Physical Interval Note:  01/02/2020 10:40 AM  Brent [name]  has presented today for surgery, with the diagnosis of cardiomyopathy.  The various methods of treatment have been discussed with the patient and family. After consideration of risks, benefits and other options for treatment, the patient has consented to  Procedure(s): ICD IMPLANT (N/A) as a surgical intervention.  The patient's history has been reviewed, patient examined, no change in status, stable for surgery.  I have reviewed the patient's chart and labs.  Questions were answered to the patient's satisfaction.     Brent [name]

## 2020-01-03 ENCOUNTER — Ambulatory Visit (HOSPITAL_COMMUNITY): Payer: Medicare Other

## 2020-01-03 ENCOUNTER — Encounter (HOSPITAL_COMMUNITY): Payer: Self-pay | Admitting: Internal Medicine

## 2020-01-03 ENCOUNTER — Encounter: Payer: Self-pay | Admitting: Physician Assistant

## 2020-01-03 DIAGNOSIS — I5022 Chronic systolic (congestive) heart failure: Secondary | ICD-10-CM | POA: Diagnosis not present

## 2020-01-03 DIAGNOSIS — I130 Hypertensive heart and chronic kidney disease with heart failure and stage 1 through stage 4 chronic kidney disease, or unspecified chronic kidney disease: Secondary | ICD-10-CM | POA: Diagnosis not present

## 2020-01-03 DIAGNOSIS — Z95810 Presence of automatic (implantable) cardiac defibrillator: Secondary | ICD-10-CM | POA: Diagnosis not present

## 2020-01-03 DIAGNOSIS — N184 Chronic kidney disease, stage 4 (severe): Secondary | ICD-10-CM | POA: Diagnosis not present

## 2020-01-03 DIAGNOSIS — Z888 Allergy status to other drugs, medicaments and biological substances status: Secondary | ICD-10-CM | POA: Diagnosis not present

## 2020-01-03 DIAGNOSIS — Z7984 Long term (current) use of oral hypoglycemic drugs: Secondary | ICD-10-CM | POA: Diagnosis not present

## 2020-01-03 DIAGNOSIS — I428 Other cardiomyopathies: Secondary | ICD-10-CM | POA: Diagnosis not present

## 2020-01-03 DIAGNOSIS — Z881 Allergy status to other antibiotic agents status: Secondary | ICD-10-CM | POA: Diagnosis not present

## 2020-01-03 DIAGNOSIS — Z79899 Other long term (current) drug therapy: Secondary | ICD-10-CM | POA: Diagnosis not present

## 2020-01-03 DIAGNOSIS — Z7982 Long term (current) use of aspirin: Secondary | ICD-10-CM | POA: Diagnosis not present

## 2020-01-03 DIAGNOSIS — Z006 Encounter for examination for normal comparison and control in clinical research program: Secondary | ICD-10-CM | POA: Diagnosis not present

## 2020-01-03 DIAGNOSIS — Z833 Family history of diabetes mellitus: Secondary | ICD-10-CM | POA: Diagnosis not present

## 2020-01-03 DIAGNOSIS — Z87891 Personal history of nicotine dependence: Secondary | ICD-10-CM | POA: Diagnosis not present

## 2020-01-03 MED FILL — Lidocaine HCl Local Inj 1%: INTRAMUSCULAR | Qty: 60 | Status: AC

## 2020-01-03 NOTE — Progress Notes (Signed)
D/C instructions given and reviewed. No questions asked at this time but encouraged to call with any concerns.

## 2020-01-03 NOTE — Discharge Instructions (Signed)
    Supplemental Discharge Instructions for  Pacemaker/Defibrillator Patients    Activity No heavy lifting or vigorous activity with your left/right arm for 6 to 8 weeks.  Do not raise your left/right arm above your head for one week.  Gradually raise your affected arm as drawn below.             11/12/201                01/07/2020               01/08/2020            01/09/2020 __  NO DRIVING for  1 week   ; you may begin driving on  88/89/1694   .  WOUND CARE - Keep the wound area clean and dry.  Do not get this area wet , no showers for one week; you may shower on  01/09/2020. - The tape/steri-strips on your wound will fall off; do not pull them off.  No bandage is needed on the site.  DO  NOT apply any creams, oils, or ointments to the wound area. - If you notice any drainage or discharge from the wound, any swelling or bruising at the site, or you develop a fever > 101? F after you are discharged home, call the office at once.  Special Instructions - You are still able to use cellular telephones; use the ear opposite the side where you have your pacemaker/defibrillator.  Avoid carrying your cellular phone near your device. - When traveling through airports, show security personnel your identification card to avoid being screened in the metal detectors.  Ask the security personnel to use the hand wand. - Avoid arc welding equipment, MRI testing (magnetic resonance imaging), TENS units (transcutaneous nerve stimulators).  Call the office for questions about other devices. - Avoid electrical appliances that are in poor condition or are not properly grounded. - Microwave ovens are safe to be near or to operate.  Additional information for defibrillator patients should your device go off: - If your device goes off ONCE and you feel fine afterward, notify the device clinic nurses. - If your device goes off ONCE and you do not feel well afterward, call 911. - If your device goes off  TWICE, call 911. - If your device goes off THREE times in one day, call 911.  DO NOT DRIVE YOURSELF OR A FAMILY MEMBER WITH A DEFIBRILLATOR TO THE HOSPITAL--CALL 911.

## 2020-01-06 ENCOUNTER — Telehealth: Payer: Self-pay

## 2020-01-06 NOTE — Telephone Encounter (Signed)
Pt reports his remote monitor has been blinking blue all day.  Assisted pt with resetting monitor.

## 2020-01-09 ENCOUNTER — Telehealth (HOSPITAL_COMMUNITY): Payer: Self-pay | Admitting: Pharmacy Technician

## 2020-01-09 ENCOUNTER — Telehealth: Payer: Self-pay

## 2020-01-09 DIAGNOSIS — N2581 Secondary hyperparathyroidism of renal origin: Secondary | ICD-10-CM | POA: Diagnosis not present

## 2020-01-09 DIAGNOSIS — N3281 Overactive bladder: Secondary | ICD-10-CM | POA: Diagnosis not present

## 2020-01-09 DIAGNOSIS — N184 Chronic kidney disease, stage 4 (severe): Secondary | ICD-10-CM | POA: Diagnosis not present

## 2020-01-09 DIAGNOSIS — I129 Hypertensive chronic kidney disease with stage 1 through stage 4 chronic kidney disease, or unspecified chronic kidney disease: Secondary | ICD-10-CM | POA: Diagnosis not present

## 2020-01-09 DIAGNOSIS — D631 Anemia in chronic kidney disease: Secondary | ICD-10-CM | POA: Diagnosis not present

## 2020-01-09 NOTE — Telephone Encounter (Signed)
It's time to re-enroll patient to receive medication assistance for Corlanor and Farxiga from [facility] and [employer]. Heather (EMT), is going to pick up the applications for the patient to sign.  Will fax in upon receipt.

## 2020-01-09 NOTE — Telephone Encounter (Signed)
I called [employer] to order the pt a Medtronic Id card. They states the pt id card already been requested and should reach him by Wednesday January 11, 2020. The latest he should receive it 01/16/2020.  I called and left a message for the pt to call me so I can let him know what tech support said.

## 2020-01-11 ENCOUNTER — Other Ambulatory Visit (HOSPITAL_COMMUNITY): Payer: Self-pay

## 2020-01-11 NOTE — Telephone Encounter (Signed)
Received completed applications, will fax in December 1st during the re-enrollment period.

## 2020-01-11 NOTE — Progress Notes (Signed)
Paramedicine Encounter    Patient ID: Brent [name], male    DOB: Feb 07, 1946, 74 y.o.   MRN: [id number]   Patient Care Team: Gayland Curry, DO as PCP - General (Geriatric Medicine) [place], Peter M, MD as PCP - Cardiology (Cardiology) Larey Dresser, MD as PCP - Advanced Heart Failure (Cardiology) Elmarie Shiley, MD (Nephrology) Milus Banister, MD (Gastroenterology)  Patient Active Problem List   Diagnosis Date Noted  . Chronic systolic heart failure ([place]) 01/02/2020  . Decreased appetite 10/08/2019  . Weakness 10/08/2019  . Hyperkalemia 08/26/2019  . Chronic systolic CHF (congestive heart failure) ([place]) 08/26/2019  . Type 2 diabetes mellitus with stage 3 chronic kidney disease ([place]) 08/26/2019  . Chronic obstructive pulmonary disease ([place]) 06/20/2019  . Ventricular tachycardia ([place]) 06/20/2019  . Acute on chronic systolic (congestive) heart failure ([place]) 04/25/2019  . Metabolic acidosis 52/84/1324  . Orthostasis 09/07/2018  . AKI (acute kidney injury) ([place])   . Immunosuppressed status ([place])   . Macrocytic anemia   . Chronic combined systolic and diastolic congestive heart failure ([place]) 06/03/2018  . Candida esophagitis ([place]) 09/22/2017  . History of smoking 30 or more pack years 01/05/2017  . Bipolar 1 disorder ([place])   . BPH (benign prostatic hyperplasia) 03/31/2013  . 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 Essential hypertension, benign 03/31/2013  . Acute renal failure superimposed on stage 3 chronic kidney disease ([place]) 06/04/2012  . 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[place]) 01/23/2010    Current Outpatient Medications:  .  acetaminophen (TYLENOL) 160 MG/5ML liquid, Take 325 mg by mouth every 4 (four) hours as needed for fever., Disp: , Rfl:  .  allopurinol (ZYLOPRIM) 100 MG tablet, Take 2 tablets (200 mg total) by mouth daily. (Patient taking differently: Take 100 mg by mouth daily. ), Disp: 60 tablet, Rfl: 5 .  aspirin EC 81 MG tablet, Take 1 tablet (81 mg total) by  mouth daily., Disp: 90 tablet, Rfl: 3 .  Cholecalciferol (VITAMIN D) 125 MCG (5000 UT) CAPS, Take 5,000 Units by mouth daily. , Disp: , Rfl:  .  dapagliflozin propanediol (FARXIGA) 10 MG TABS tablet, Take 1 tablet (10 mg total) by mouth daily before breakfast., Disp: 30 tablet, Rfl: 6 .  divalproex (DEPAKOTE ER) 500 MG 24 hr tablet, Take 1,500 mg by mouth at bedtime. , Disp: , Rfl:  .  epoetin alfa (PROCRIT) 40102 UNIT/ML injection, Inject 10,000 Units into the skin every 30 (thirty) days. , Disp: , Rfl:  .  ferrous sulfate 325 (65 FE) MG tablet, Take 325 mg by mouth in the morning and at bedtime. , Disp: , Rfl:  .  furosemide (LASIX) 20 MG tablet, Take 20 mg by mouth every Monday, Wednesday, and Friday., Disp: , Rfl:  .  hydrALAZINE (APRESOLINE) 25 MG tablet, Take 1 tablet (25 mg total) by mouth 2 (two) times daily. (Patient taking differently: Take 25 mg by mouth 3 (three) times daily. ), Disp: 60 tablet, Rfl: 3 .  inFLIXimab (REMICADE) 100 MG injection, Infuse Remicade IV schedule 1 81m/kg every 8 weeks Premedicate with Tylenol 500-6524mby mouth and Benadryl 25-5051my mouth prior to infusion. Last PPD was on 12/2009. , Disp: 1 each, Rfl: 6 .  isosorbide mononitrate (IMDUR) 30 MG 24 hr tablet, Take 1 tablet (30 mg total) by mouth daily., Disp: 90 tablet, Rfl: 3 .  ivabradine (CORLANOR) 5 MG TABS tablet, Take 1 tablet (5 mg total) by mouth 2 (two) times daily with a meal., Disp: 180  tablet, Rfl: 3 .  loperamide (IMODIUM) 2 MG capsule, Take 1 capsule (2 mg total) by mouth 4 (four) times daily as needed for diarrhea or loose stools., Disp: 12 capsule, Rfl: 0 .  magnesium oxide (MAG-OX) 400 MG tablet, Take 2 tablets (800 mg total) by mouth in the morning, at noon, and at bedtime. (Patient not taking: Reported on 12/28/2019), Disp: 60 tablet, Rfl: 2 .  metoprolol succinate (TOPROL-XL) 25 MG 24 hr tablet, Take 1 tablet (25 mg total) by mouth at bedtime., Disp: 90 tablet, Rfl: 3 .  omeprazole (PRILOSEC) 20  MG capsule, Take 20 mg by mouth at bedtime. , Disp: , Rfl:  .  tamsulosin (FLOMAX) 0.4 MG CAPS capsule, Take 1 capsule (0.4 mg total) by mouth daily., Disp: 30 capsule, Rfl: 2 .  triamcinolone (NASACORT ALLERGY 24HR) 55 MCG/ACT AERO nasal inhaler, Place 2 sprays into the nose daily as needed (allergies)., Disp: , Rfl:  .  vitamin B-12 (CYANOCOBALAMIN) 500 MCG tablet, Take 1,000 mcg by mouth daily. , Disp: , Rfl:  Allergies  Allergen Reactions  . Azathioprine Other (See Comments)    REACTION: affected WBC "Almost died"  . Ciprofloxacin Other (See Comments)    Unknown rxn  . Levaquin [Levofloxacin In D5w] Other (See Comments)    Unknown rxn  . Plendil [Felodipine] Other (See Comments)    Unknown rxn     Social History   Socioeconomic History  . Marital status: Divorced    Spouse name: Not on file  . Number of children: 1  . Years of education: 27  . Highest education level: Not on file  Occupational History  . Occupation: retired  . Occupation: Veteran  Tobacco Use  . Smoking status: Former Smoker    Packs/day: 1.00    Years: 49.00    Pack years: 49.00    Types: Cigarettes    Start date: 04/11/1956    Quit date: 04/17/2014    Years since quitting: 5.7  . Smokeless tobacco: Never Used  Vaping Use  . Vaping Use: Never used  Substance and Sexual Activity  . Alcohol use: No    Alcohol/week: 0.0 standard drinks  . Drug use: No  . Sexual activity: Never  Other Topics Concern  . Not on file  Social History Narrative  . Not on file   Social Determinants of Health   Financial Resource Strain:   . Difficulty of Paying Living Expenses: Not on file  Food Insecurity:   . Worried About Charity fundraiser in the Last Year: Not on file  . Ran Out of Food in the Last Year: Not on file  Transportation Needs:   . Lack of Transportation (Medical): Not on file  . Lack of Transportation (Non-Medical): Not on file  Physical Activity:   . Days of Exercise per Week: Not on file  .  Minutes of Exercise per Session: Not on file  Stress:   . Feeling of Stress : Not on file  Social Connections:   . Frequency of Communication with Friends and Family: Not on file  . Frequency of Social Gatherings with Friends and Family: Not on file  . Attends Religious Services: Not on file  . Active Member of Clubs or Organizations: Not on file  . Attends Archivist Meetings: Not on file  . Marital Status: Not on file  Intimate Partner Violence:   . Fear of Current or Ex-Partner: Not on file  . Emotionally Abused: Not on file  .  Physically Abused: Not on file  . Sexually Abused: Not on file    Physical Exam Vitals reviewed.  Constitutional:      Appearance: He is normal weight.  HENT:     Head: Normocephalic.     Nose: Nose normal. No congestion.     Mouth/Throat:     Mouth: Mucous membranes are moist.     Pharynx: Oropharynx is clear.  Eyes:     Pupils: Pupils are equal, round, and reactive to light.  Cardiovascular:     Rate and Rhythm: Normal rate and regular rhythm.     Pulses: Normal pulses.     Heart sounds: Normal heart sounds.  Pulmonary:     Effort: Pulmonary effort is normal.     Breath sounds: Normal breath sounds.  Abdominal:     General: Abdomen is flat.     Palpations: Abdomen is soft.  Musculoskeletal:        General: Swelling present. Normal range of motion.     Cervical back: Normal range of motion.     Right lower leg: Edema present.     Left lower leg: Edema present.  Skin:    General: Skin is warm and dry.     Capillary Refill: Capillary refill takes less than 2 seconds.  Neurological:     General: No focal deficit present.     Mental Status: He is alert. Mental status is at baseline.  Psychiatric:        Mood and Affect: Mood normal.     Arrived for home visit for Domanique who was alert and oriented reporting he was feeling great. Reason had ICD implanted last week and reports he has been feeling good with no complications. Vitals  were obtained. No shortness of breath, dizziness or chest pain. Lung sounds clear. Some slight edema in the lower legs. Callum was compliant with all medications over the last two weeks. I reviewed medications and confirmed same. Pill box filled accordingly. Esmeralda agreed to visit in one week. Alexiz reports he saw his kidney doctor and was prescribed Solfenacin 20m for increases urination and is needing it sent to [facility] I called Patel's office for same. JRodriqueswas given MOMS MEALS but he canceled it due to them not tasting good. I educated JClentonon proper low sodium foods, he verbalized understanding.  JKorbynwas informed of upcoming appointments and agreed with same. Home visit complete. I will see JTuffin one week.  Refills: Omeprazole    Future Appointments  Date Time Provider [place] 01/12/2020 12:00 PM [facility] DEVICE 1 [facility]  01/16/2020 10:00 AM [facility] RM 1 [facility] None  01/24/2020  2:15 PM [name], Dinah C, NP [facility] None  02/23/2020 11:00 AM Reed, Tiffany L, DO [facility] None  02/27/2020  1:40 PM MLarey Dresser MD [facility] None  04/03/2020  7:00 AM [facility] DEVICE REMOTES [facility]  04/12/2020  2:15 PM TEvans Lance MD [facility]  07/03/2020  7:00 AM [facility] DEVICE REMOTES [facility]  10/02/2020  7:00 AM [facility] DEVICE REMOTES [facility]  01/01/2021  7:00 AM [facility] DEVICE REMOTES [facility]  04/02/2021  7:00 AM [facility] DEVICE REMOTES [facility]  07/02/2021  7:00 AM [facility] DEVICE REMOTES [facility]     ACTION: Home visit completed Next visit planned for one week

## 2020-01-12 ENCOUNTER — Other Ambulatory Visit: Payer: Self-pay

## 2020-01-12 ENCOUNTER — Telehealth: Payer: Self-pay

## 2020-01-12 ENCOUNTER — Ambulatory Visit (INDEPENDENT_AMBULATORY_CARE_PROVIDER_SITE_OTHER): Payer: Medicare Other | Admitting: Emergency Medicine

## 2020-01-12 DIAGNOSIS — I472 Ventricular tachycardia, unspecified: Secondary | ICD-10-CM

## 2020-01-12 LAB — CUP PACEART INCLINIC DEVICE CHECK
Battery Remaining Longevity: 136 mo
Battery Voltage: 3.15 V
Brady Statistic RV Percent Paced: 0.01 %
Date Time Interrogation Session: 20211118122038
HighPow Impedance: 62 Ohm
Implantable Lead Implant Date: 20211108
Implantable Lead Location: 753860
Implantable Pulse Generator Implant Date: 20211108
Lead Channel Impedance Value: 361 Ohm
Lead Channel Impedance Value: 456 Ohm
Lead Channel Pacing Threshold Amplitude: 0.75 V
Lead Channel Pacing Threshold Pulse Width: 0.4 ms
Lead Channel Sensing Intrinsic Amplitude: 20 mV
Lead Channel Setting Pacing Amplitude: 3.5 V
Lead Channel Setting Pacing Pulse Width: 0.4 ms
Lead Channel Setting Sensing Sensitivity: 0.3 mV

## 2020-01-12 NOTE — Telephone Encounter (Signed)
Patient seen in [facility] 01/12/20. Home remote monitor is not connected properly. Advised patient to send transmission when he gets home to see if we can get monitor working. Education packet given to patient with verbal understanding.

## 2020-01-12 NOTE — Progress Notes (Signed)
Wound check appointment. Steri-strips removed. Wound without redness or edema. Incision edges approximated, wound well healed. Normal device function. Thresholds, sensing, and impedances consistent with implant measurements. Device programmed at 3.5V for extra safety margin until 3 month visit. Histogram distribution appropriate for patient and level of activity. No mode switches or ventricular arrhythmias noted. Patient educated about wound care, arm mobility, lifting restrictions, shock plan. ROV in 3 months with implanting physician. Remote monitor is not connected. Patient instructed to attempt to send transmission when he arrives home. Education packet on monitor provided

## 2020-01-13 NOTE — Telephone Encounter (Signed)
Patient returned call and was not home to send that transmission and he was told to send one when he gets home. Patient verbalized understanding

## 2020-01-13 NOTE — Telephone Encounter (Signed)
We have not received a transmission as od now.  Left VM for pt requesting he send transmission or call us if assistance is needed.

## 2020-01-13 NOTE — Telephone Encounter (Signed)
Transmission received.

## 2020-01-16 ENCOUNTER — Ambulatory Visit (HOSPITAL_COMMUNITY)
Admission: RE | Admit: 2020-01-16 | Discharge: 2020-01-16 | Disposition: A | Discharge status: Home / Self Care | Payer: Medicare Other | Attending: Nephrology | Admitting: Nephrology

## 2020-01-16 ENCOUNTER — Other Ambulatory Visit: Payer: Self-pay

## 2020-01-16 DIAGNOSIS — D631 Anemia in chronic kidney disease: Secondary | ICD-10-CM | POA: Insufficient documentation

## 2020-01-16 DIAGNOSIS — N184 Chronic kidney disease, stage 4 (severe): Secondary | ICD-10-CM | POA: Diagnosis not present

## 2020-01-16 LAB — IRON AND TIBC
Iron: 74 ug/dL (ref 45–182)
Saturation Ratios: 32 % (ref 17.9–39.5)
TIBC: 235 ug/dL — ABNORMAL LOW (ref 250–450)
UIBC: 161 ug/dL

## 2020-01-16 LAB — HEMOGLOBIN AND HEMATOCRIT, BLOOD
HCT: 35.2 % — ABNORMAL LOW (ref 39.0–52.0)
Hemoglobin: 11 g/dL — ABNORMAL LOW (ref 13.0–17.0)

## 2020-01-16 LAB — FERRITIN: Ferritin: 850 ng/mL — ABNORMAL HIGH (ref 24–336)

## 2020-01-16 MED ORDER — EPOETIN ALFA 10000 UNIT/ML IJ SOLN
10000.0000 [IU] | INTRAMUSCULAR | Status: DC
  Administered 2020-01-16: 10000 [IU] via SUBCUTANEOUS
  Filled 2020-01-16: qty 1

## 2020-01-16 NOTE — Discharge Instructions (Signed)

## 2020-01-16 NOTE — Progress Notes (Signed)
PATIENT CARE CENTER NOTE   Provider: Elmarie Shiley MD   Procedure: Epoetin (Epogen) injection   Note: Patient received sub-q epogen injection in the right arm. Labs were drawn pre injection and hemoglobin was 11.0. Patient's BP was 140/87 . Discharge instructions given, patient alert, oriented and transported in a wheelchair at discharge.

## 2020-01-17 ENCOUNTER — Other Ambulatory Visit (HOSPITAL_COMMUNITY): Payer: Self-pay

## 2020-01-17 DIAGNOSIS — Z87891 Personal history of nicotine dependence: Secondary | ICD-10-CM | POA: Diagnosis not present

## 2020-01-17 DIAGNOSIS — Z1211 Encounter for screening for malignant neoplasm of colon: Secondary | ICD-10-CM | POA: Diagnosis not present

## 2020-01-17 DIAGNOSIS — Z86010 Personal history of colonic polyps: Secondary | ICD-10-CM | POA: Diagnosis not present

## 2020-01-17 DIAGNOSIS — D124 Benign neoplasm of descending colon: Secondary | ICD-10-CM | POA: Diagnosis not present

## 2020-01-17 DIAGNOSIS — Z9889 Other specified postprocedural states: Secondary | ICD-10-CM | POA: Diagnosis not present

## 2020-01-17 DIAGNOSIS — K529 Noninfective gastroenteritis and colitis, unspecified: Secondary | ICD-10-CM | POA: Diagnosis not present

## 2020-01-17 DIAGNOSIS — D123 Benign neoplasm of transverse colon: Secondary | ICD-10-CM | POA: Diagnosis not present

## 2020-01-17 DIAGNOSIS — Z79899 Other long term (current) drug therapy: Secondary | ICD-10-CM | POA: Diagnosis not present

## 2020-01-17 DIAGNOSIS — K5090 Crohn's disease, unspecified, without complications: Secondary | ICD-10-CM | POA: Diagnosis not present

## 2020-01-17 DIAGNOSIS — K5000 Crohn's disease of small intestine without complications: Secondary | ICD-10-CM | POA: Diagnosis not present

## 2020-01-17 DIAGNOSIS — R1013 Epigastric pain: Secondary | ICD-10-CM | POA: Diagnosis not present

## 2020-01-17 DIAGNOSIS — I509 Heart failure, unspecified: Secondary | ICD-10-CM | POA: Diagnosis not present

## 2020-01-17 DIAGNOSIS — I130 Hypertensive heart and chronic kidney disease with heart failure and stage 1 through stage 4 chronic kidney disease, or unspecified chronic kidney disease: Secondary | ICD-10-CM | POA: Diagnosis not present

## 2020-01-17 DIAGNOSIS — R350 Frequency of micturition: Secondary | ICD-10-CM | POA: Diagnosis not present

## 2020-01-17 DIAGNOSIS — N1832 Chronic kidney disease, stage 3b: Secondary | ICD-10-CM | POA: Diagnosis not present

## 2020-01-17 DIAGNOSIS — M109 Gout, unspecified: Secondary | ICD-10-CM | POA: Diagnosis not present

## 2020-01-17 DIAGNOSIS — Z980 Intestinal bypass and anastomosis status: Secondary | ICD-10-CM | POA: Diagnosis not present

## 2020-01-17 DIAGNOSIS — K219 Gastro-esophageal reflux disease without esophagitis: Secondary | ICD-10-CM | POA: Diagnosis not present

## 2020-01-17 DIAGNOSIS — K5080 Crohn's disease of both small and large intestine without complications: Secondary | ICD-10-CM | POA: Diagnosis not present

## 2020-01-17 DIAGNOSIS — K635 Polyp of colon: Secondary | ICD-10-CM | POA: Diagnosis not present

## 2020-01-17 DIAGNOSIS — R1310 Dysphagia, unspecified: Secondary | ICD-10-CM | POA: Diagnosis not present

## 2020-01-17 DIAGNOSIS — Z95810 Presence of automatic (implantable) cardiac defibrillator: Secondary | ICD-10-CM | POA: Diagnosis not present

## 2020-01-17 MED ORDER — METOPROLOL SUCCINATE ER 25 MG PO TB24
25.0000 mg | ORAL_TABLET | Freq: Every day | ORAL | 6 refills | Status: DC
Start: 2020-01-17 — End: 2020-01-17

## 2020-01-17 MED ORDER — METOPROLOL SUCCINATE ER 25 MG PO TB24
25.0000 mg | ORAL_TABLET | Freq: Every day | ORAL | 6 refills | Status: DC
Start: 2020-01-17 — End: 2020-02-27

## 2020-01-18 ENCOUNTER — Other Ambulatory Visit (HOSPITAL_COMMUNITY): Payer: Self-pay

## 2020-01-18 NOTE — Progress Notes (Signed)
Paramedicine Encounter    Patient ID: Brent [name], male    DOB: 1946/01/30, 74 y.o.   MRN: [id number]   Patient Care Team: Gayland Curry, DO as PCP - General (Geriatric Medicine) [place], Peter M, MD as PCP - Cardiology (Cardiology) Larey Dresser, MD as PCP - Advanced Heart Failure (Cardiology) Elmarie Shiley, MD (Nephrology) Milus Banister, MD (Gastroenterology)  Patient Active Problem List   Diagnosis Date Noted  . Chronic systolic heart failure ([place]) 01/02/2020  . Decreased appetite 10/08/2019  . Weakness 10/08/2019  . Hyperkalemia 08/26/2019  . Chronic systolic CHF (congestive heart failure) ([place]) 08/26/2019  . Type 2 diabetes mellitus with stage 3 chronic kidney disease ([place]) 08/26/2019  . Chronic obstructive pulmonary disease ([place]) 06/20/2019  . Ventricular tachycardia ([place]) 06/20/2019  . Acute on chronic systolic (congestive) heart failure ([place]) 04/25/2019  . Metabolic acidosis 40/98/1191  . Orthostasis 09/07/2018  . AKI (acute kidney injury) ([place])   . Immunosuppressed status ([place])   . Macrocytic anemia   . Chronic combined systolic and diastolic congestive heart failure ([place]) 06/03/2018  . Candida esophagitis ([place]) 09/22/2017  . History of smoking 30 or more pack years 01/05/2017  . Bipolar 1 disorder ([place])   . BPH (benign prostatic hyperplasia) 03/31/2013  . 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 Essential hypertension, benign 03/31/2013  . Acute renal failure superimposed on stage 3 chronic kidney disease ([place]) 06/04/2012  . 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[place]) 01/23/2010    Current Outpatient Medications:  .  acetaminophen (TYLENOL) 160 MG/5ML liquid, Take 325 mg by mouth every 4 (four) hours as needed for fever., Disp: , Rfl:  .  allopurinol (ZYLOPRIM) 100 MG tablet, Take 2 tablets (200 mg total) by mouth daily. (Patient taking differently: Take 100 mg by mouth daily. ), Disp: 60 tablet, Rfl: 5 .  aspirin EC 81 MG tablet, Take 1 tablet (81 mg total) by  mouth daily., Disp: 90 tablet, Rfl: 3 .  Cholecalciferol (VITAMIN D) 125 MCG (5000 UT) CAPS, Take 5,000 Units by mouth daily. , Disp: , Rfl:  .  dapagliflozin propanediol (FARXIGA) 10 MG TABS tablet, Take 1 tablet (10 mg total) by mouth daily before breakfast., Disp: 30 tablet, Rfl: 6 .  divalproex (DEPAKOTE ER) 500 MG 24 hr tablet, Take 1,500 mg by mouth at bedtime. , Disp: , Rfl:  .  epoetin alfa (PROCRIT) 47829 UNIT/ML injection, Inject 10,000 Units into the skin every 30 (thirty) days. , Disp: , Rfl:  .  ferrous sulfate 325 (65 FE) MG tablet, Take 325 mg by mouth in the morning and at bedtime. , Disp: , Rfl:  .  furosemide (LASIX) 20 MG tablet, Take 20 mg by mouth every Monday, Wednesday, and Friday., Disp: , Rfl:  .  haloperidol (HALDOL) 5 MG tablet, Take 5 mg by mouth at bedtime., Disp: , Rfl:  .  hydrALAZINE (APRESOLINE) 25 MG tablet, Take 1 tablet (25 mg total) by mouth 2 (two) times daily. (Patient taking differently: Take 25 mg by mouth 3 (three) times daily. ), Disp: 60 tablet, Rfl: 3 .  inFLIXimab (REMICADE) 100 MG injection, Infuse Remicade IV schedule 1 50m/kg every 8 weeks Premedicate with Tylenol 500-6544mby mouth and Benadryl 25-5023my mouth prior to infusion. Last PPD was on 12/2009. , Disp: 1 each, Rfl: 6 .  isosorbide mononitrate (IMDUR) 30 MG 24 hr tablet, Take 1 tablet (30 mg total) by mouth daily., Disp: 90 tablet, Rfl: 3 .  ivabradine (CORLANOR) 5 MG TABS  tablet, Take 1 tablet (5 mg total) by mouth 2 (two) times daily with a meal., Disp: 180 tablet, Rfl: 3 .  loperamide (IMODIUM) 2 MG capsule, Take 1 capsule (2 mg total) by mouth 4 (four) times daily as needed for diarrhea or loose stools., Disp: 12 capsule, Rfl: 0 .  magnesium oxide (MAG-OX) 400 MG tablet, Take 2 tablets (800 mg total) by mouth in the morning, at noon, and at bedtime., Disp: 60 tablet, Rfl: 2 .  metoprolol succinate (TOPROL-XL) 25 MG 24 hr tablet, Take 1 tablet (25 mg total) by mouth at bedtime., Disp: 30  tablet, Rfl: 6 .  omeprazole (PRILOSEC) 20 MG capsule, Take 20 mg by mouth at bedtime. , Disp: , Rfl:  .  solifenacin (VESICARE) 5 MG tablet, Take 5 mg by mouth daily., Disp: , Rfl:  .  tamsulosin (FLOMAX) 0.4 MG CAPS capsule, Take 1 capsule (0.4 mg total) by mouth daily., Disp: 30 capsule, Rfl: 2 .  triamcinolone (NASACORT ALLERGY 24HR) 55 MCG/ACT AERO nasal inhaler, Place 2 sprays into the nose daily as needed (allergies)., Disp: , Rfl:  .  vitamin B-12 (CYANOCOBALAMIN) 500 MCG tablet, Take 1,000 mcg by mouth daily. , Disp: , Rfl:  Allergies  Allergen Reactions  . Azathioprine Other (See Comments)    REACTION: affected WBC "Almost died"  . Ciprofloxacin Other (See Comments)    Unknown rxn  . Levaquin [Levofloxacin In D5w] Other (See Comments)    Unknown rxn  . Plendil [Felodipine] Other (See Comments)    Unknown rxn     Social History   Socioeconomic History  . Marital status: Divorced    Spouse name: Not on file  . Number of children: 1  . Years of education: 27  . Highest education level: Not on file  Occupational History  . Occupation: retired  . Occupation: Veteran  Tobacco Use  . Smoking status: Former Smoker    Packs/day: 1.00    Years: 49.00    Pack years: 49.00    Types: Cigarettes    Start date: 04/11/1956    Quit date: 04/17/2014    Years since quitting: 5.7  . Smokeless tobacco: Never Used  Vaping Use  . Vaping Use: Never used  Substance and Sexual Activity  . Alcohol use: No    Alcohol/week: 0.0 standard drinks  . Drug use: No  . Sexual activity: Never  Other Topics Concern  . Not on file  Social History Narrative  . Not on file   Social Determinants of Health   Financial Resource Strain:   . Difficulty of Paying Living Expenses: Not on file  Food Insecurity:   . Worried About Charity fundraiser in the Last Year: Not on file  . Ran Out of Food in the Last Year: Not on file  Transportation Needs:   . Lack of Transportation (Medical): Not on file   . Lack of Transportation (Non-Medical): Not on file  Physical Activity:   . Days of Exercise per Week: Not on file  . Minutes of Exercise per Session: Not on file  Stress:   . Feeling of Stress : Not on file  Social Connections:   . Frequency of Communication with Friends and Family: Not on file  . Frequency of Social Gatherings with Friends and Family: Not on file  . Attends Religious Services: Not on file  . Active Member of Clubs or Organizations: Not on file  . Attends Archivist Meetings: Not on file  .  Marital Status: Not on file  Intimate Partner Violence:   . Fear of Current or Ex-Partner: Not on file  . Emotionally Abused: Not on file  . Physically Abused: Not on file  . Sexually Abused: Not on file    Physical Exam Vitals reviewed.  Constitutional:      Appearance: He is normal weight.  HENT:     Head: Normocephalic.     Nose: Nose normal. No congestion.     Mouth/Throat:     Mouth: Mucous membranes are moist.     Pharynx: Oropharynx is clear.  Eyes:     Pupils: Pupils are equal, round, and reactive to light.  Cardiovascular:     Rate and Rhythm: Normal rate and regular rhythm.     Pulses: Normal pulses.     Heart sounds: Normal heart sounds.  Pulmonary:     Effort: Pulmonary effort is normal.     Breath sounds: Normal breath sounds.  Abdominal:     General: Abdomen is flat.     Palpations: Abdomen is soft.  Musculoskeletal:        General: Swelling present. Normal range of motion.     Cervical back: Normal range of motion.     Right lower leg: Edema present.     Left lower leg: No edema.  Skin:    General: Skin is warm and dry.     Capillary Refill: Capillary refill takes less than 2 seconds.  Neurological:     General: No focal deficit present.     Mental Status: He is alert. Mental status is at baseline.  Psychiatric:        Mood and Affect: Mood normal.    Arrived for home visit for Brent [name] who was alert and oriented reporting he is  feeling good today. Brent [name] stated he had a colonoscopy yesterday and notes to have had 6 polyps awaiting pathology reports. Right leg swelling noted, patient had increased fluids including gatorade following a colonscopy. Brent [name] was educated on making sure to decrease salt amounts over the next week and being compliant with medications. Brent [name] understood same. Brent [name] medication was reviewed and confirmed and pill box filled accordingly. Brent [name]' vitals obtained and are as noted. Appointments reviewed. Home visit completed. I will see Brent [name] in one week.   Refills:  -metoprolol  -mag oxide  -omeprazole      Future Appointments  Date Time Provider [place]  01/24/2020  2:15 PM [name], [name], NP [facility] None  02/15/2020 10:00 AM [facility] None  02/23/2020 11:00 AM Reed, Tiffany L, DO [facility] None  02/27/2020  1:40 PM Larey Dresser, MD [facility] None  04/03/2020  7:00 AM [facility] DEVICE REMOTES [facility]  04/12/2020  2:15 PM Evans Lance, MD [facility]  07/03/2020  7:00 AM [facility] DEVICE REMOTES [facility]  10/02/2020  7:00 AM [facility] DEVICE REMOTES [facility]  01/01/2021  7:00 AM [facility] DEVICE REMOTES [facility]  04/02/2021  7:00 AM [facility] DEVICE REMOTES [facility]  07/02/2021  7:00 AM [facility] DEVICE REMOTES [facility]     ACTION: Home visit completed Next visit planned for one week

## 2020-01-20 ENCOUNTER — Encounter: Payer: Medicare Other | Admitting: Family

## 2020-01-24 ENCOUNTER — Other Ambulatory Visit: Payer: Self-pay

## 2020-01-24 ENCOUNTER — Ambulatory Visit (INDEPENDENT_AMBULATORY_CARE_PROVIDER_SITE_OTHER): Payer: Medicare Other | Admitting: Family

## 2020-01-24 ENCOUNTER — Encounter: Payer: Self-pay | Admitting: Family

## 2020-01-24 ENCOUNTER — Telehealth: Payer: Self-pay

## 2020-01-24 DIAGNOSIS — Z0000 Encounter for general adult medical examination without abnormal findings: Secondary | ICD-10-CM

## 2020-01-24 NOTE — Progress Notes (Signed)
    This service is provided via telemedicine  No vital signs collected/recorded due to the encounter was a telemedicine visit.   Location of patient (ex: home, work): Home.   Patient consents to a telephone visit: Yes.  Location of the provider (ex: office, home):  [facility].  Name of any referring provider: Gayland Curry, DO   Names of all persons participating in the telemedicine service and their role in the encounter: Patient, Brent [name], [place], [place], Webb Silversmith, NP.    Time spent on call: 8 minutes spent on the phone with Medical Assistant.

## 2020-01-24 NOTE — Patient Instructions (Signed)
[name] , Thank you for taking time to come for your Medicare Wellness Visit. I appreciate your ongoing commitment to your health goals. Please review the following plan we discussed and let me know if I can assist you in the future.   Screening recommendations/referrals: Colonoscopy: Up to date  Recommended yearly ophthalmology/optometry visit for glaucoma screening and checkup Recommended yearly dental visit for hygiene and checkup  Vaccinations: Influenza vaccine : Up to date Pneumococcal vaccine : Up to date Tdap vaccine : Up to date Shingles vaccine: Please get vaccine at your pharmacy    Advanced directives: No   Conditions/risks identified: Advance age male > 67 yrs,Male Gender,Hypertension,Hx of smoking   Next appointment: 1 year   Preventive Care 15 Years and Older, Male Preventive care refers to lifestyle choices and visits with your health care provider that can promote health and wellness. What does preventive care include?  A yearly physical exam. This is also called an annual well check.  Dental exams once or twice a year.  Routine eye exams. Ask your health care provider how often you should have your eyes checked.  Personal lifestyle choices, including:  Daily care of your teeth and gums.  Regular physical activity.  Eating a healthy diet.  Avoiding tobacco and drug use.  Limiting alcohol use.  Practicing safe sex.  Taking low doses of aspirin every day.  Taking vitamin and mineral supplements as recommended by your health care provider. What happens during an annual well check? The services and screenings done by your health care provider during your annual well check will depend on your age, overall health, lifestyle risk factors, and family history of disease. Counseling  Your health care provider may ask you questions about your:  Alcohol use.  Tobacco use.  Drug use.  Emotional well-being.  Home and relationship well-being.  Sexual  activity.  Eating habits.  History of falls.  Memory and ability to understand (cognition).  Work and work Statistician. Screening  You may have the following tests or measurements:  Height, weight, and BMI.  Blood pressure.  Lipid and cholesterol levels. These may be checked every 5 years, or more frequently if you are over 27 years old.  Skin check.  Lung cancer screening. You may have this screening every year starting at age 29 if you have a 30-pack-year history of smoking and currently smoke or have quit within the past 15 years.  Fecal occult blood test (FOBT) of the stool. You may have this test every year starting at age 44.  Flexible sigmoidoscopy or colonoscopy. You may have a sigmoidoscopy every 5 years or a colonoscopy every 10 years starting at age 55.  Prostate cancer screening. Recommendations will vary depending on your family history and other risks.  Hepatitis C blood test.  Hepatitis B blood test.  Sexually transmitted disease (STD) testing.  Diabetes screening. This is done by checking your blood sugar (glucose) after you have not eaten for a while (fasting). You may have this done every 1-3 years.  Abdominal aortic aneurysm (AAA) screening. You may need this if you are a current or former smoker.  Osteoporosis. You may be screened starting at age 70 if you are at high risk. Talk with your health care provider about your test results, treatment options, and if necessary, the need for more tests. Vaccines  Your health care provider may recommend certain vaccines, such as:  Influenza vaccine. This is recommended every year.  Tetanus, diphtheria, and acellular pertussis (Tdap,  Td) vaccine. You may need a Td booster every 10 years.  Zoster vaccine. You may need this after age 63.  Pneumococcal 13-valent conjugate (PCV13) vaccine. One dose is recommended after age 23.  Pneumococcal polysaccharide (PPSV23) vaccine. One dose is recommended after age  2. Talk to your health care provider about which screenings and vaccines you need and how often you need them. This information is not intended to replace advice given to you by your health care provider. Make sure you discuss any questions you have with your health care provider. Document Released: 03/09/2015 Document Revised: 10/31/2015 Document Reviewed: 12/12/2014 Elsevier Interactive Patient Education  2017 [place] Prevention in the Home Falls can cause injuries. They can happen to people of all ages. There are many things you can do to make your home safe and to help prevent falls. What can I do on the outside of my home?  Regularly fix the edges of walkways and driveways and fix any cracks.  Remove anything that might make you trip as you walk through a door, such as a raised step or threshold.  Trim any bushes or trees on the path to your home.  Use bright outdoor lighting.  Clear any walking paths of anything that might make someone trip, such as rocks or tools.  Regularly check to see if handrails are loose or broken. Make sure that both sides of any steps have handrails.  Any raised decks and porches should have guardrails on the edges.  Have any leaves, snow, or ice cleared regularly.  Use sand or salt on walking paths during winter.  Clean up any spills in your garage right away. This includes oil or grease spills. What can I do in the bathroom?  Use night lights.  Install grab bars by the toilet and in the tub and shower. Do not use towel bars as grab bars.  Use non-skid mats or decals in the tub or shower.  If you need to sit down in the shower, use a plastic, non-slip stool.  Keep the floor dry. Clean up any water that spills on the floor as soon as it happens.  Remove soap buildup in the tub or shower regularly.  Attach bath mats securely with double-sided non-slip rug tape.  Do not have throw rugs and other things on the floor that can make  you trip. What can I do in the bedroom?  Use night lights.  Make sure that you have a light by your bed that is easy to reach.  Do not use any sheets or blankets that are too big for your bed. They should not hang down onto the floor.  Have a firm chair that has side arms. You can use this for support while you get dressed.  Do not have throw rugs and other things on the floor that can make you trip. What can I do in the kitchen?  Clean up any spills right away.  Avoid walking on wet floors.  Keep items that you use a lot in easy-to-reach places.  If you need to reach something above you, use a strong step stool that has a grab bar.  Keep electrical cords out of the way.  Do not use floor polish or wax that makes floors slippery. If you must use wax, use non-skid floor wax.  Do not have throw rugs and other things on the floor that can make you trip. What can I do with my stairs?  Do not  leave any items on the stairs.  Make sure that there are handrails on both sides of the stairs and use them. Fix handrails that are broken or loose. Make sure that handrails are as long as the stairways.  Check any carpeting to make sure that it is firmly attached to the stairs. Fix any carpet that is loose or worn.  Avoid having throw rugs at the top or bottom of the stairs. If you do have throw rugs, attach them to the floor with carpet tape.  Make sure that you have a light switch at the top of the stairs and the bottom of the stairs. If you do not have them, ask someone to add them for you. What else can I do to help prevent falls?  Wear shoes that:  Do not have high heels.  Have rubber bottoms.  Are comfortable and fit you well.  Are closed at the toe. Do not wear sandals.  If you use a stepladder:  Make sure that it is fully opened. Do not climb a closed stepladder.  Make sure that both sides of the stepladder are locked into place.  Ask someone to hold it for you, if  possible.  Clearly mark and make sure that you can see:  Any grab bars or handrails.  First and last steps.  Where the edge of each step is.  Use tools that help you move around (mobility aids) if they are needed. These include:  Canes.  Walkers.  Scooters.  Crutches.  Turn on the lights when you go into a dark area. Replace any light bulbs as soon as they burn out.  Set up your furniture so you have a clear path. Avoid moving your furniture around.  If any of your floors are uneven, fix them.  If there are any pets around you, be aware of where they are.  Review your medicines with your doctor. Some medicines can make you feel dizzy. This can increase your chance of falling. Ask your doctor what other things that you can do to help prevent falls. This information is not intended to replace advice given to you by your health care provider. Make sure you discuss any questions you have with your health care provider. Document Released: 12/07/2008 Document Revised: 07/19/2015 Document Reviewed: 03/17/2014 Elsevier Interactive Patient Education  2017 [employer].

## 2020-01-24 NOTE — Progress Notes (Signed)
Subjective:   Brent [name] is a 74 y.o. male who presents for Medicare Annual/Subsequent preventive examination.  Review of Systems     Cardiac Risk Factors include: advanced age (>53mn, >>75women);male gender;hypertension;smoking/ tobacco exposure     Objective:    Today's Vitals   01/24/20 1448  PainSc: 0-No pain   There is no height or weight on file to calculate BMI.  Advanced Directives 01/24/2020 01/02/2020 12/16/2019 12/05/2019 10/20/2019 09/06/2019 08/26/2019  Does Patient Have a Medical Advance Directive? No Yes Yes Yes Yes Yes No  Type of Advance Directive - HRupertLiving will Out of facility DNR (pink MOST or yellow form) Out of facility DNR (pink MOST or yellow form) Out of facility DNR (pink MOST or yellow form) - -  Does patient want to make changes to medical advance directive? No - Patient declined - No - Guardian declined No - Patient declined No - Patient declined - -  Copy of H[place]in Chart? - - - - - - -  Would patient like information on creating a medical advance directive? - - - - - No - Patient declined No - Patient declined  Pre-existing out of facility DNR order (yellow form or pink MOST form) - - - Pink MOST form placed in chart (order not valid for inpatient use) Pink MOST/Yellow Form most recent copy in chart - Physician notified to receive inpatient order - -    Current Medications (verified) Outpatient Encounter Medications as of 01/24/2020  Medication Sig  . acetaminophen (TYLENOL) 160 MG/5ML liquid Take 325 mg by mouth every 4 (four) hours as needed for fever.  .Marland Kitchenallopurinol (ZYLOPRIM) 100 MG tablet Take 2 tablets (200 mg total) by mouth daily.  .Marland Kitchenaspirin EC 81 MG tablet Take 1 tablet (81 mg total) by mouth daily.  . Cholecalciferol (VITAMIN D) 125 MCG (5000 UT) CAPS Take 5,000 Units by mouth daily.   . dapagliflozin propanediol (FARXIGA) 10 MG TABS tablet Take 1 tablet (10 mg total) by mouth daily before  breakfast.  . divalproex (DEPAKOTE ER) 500 MG 24 hr tablet Take 1,500 mg by mouth at bedtime.   .Marland Kitchenepoetin alfa (PROCRIT) 194765UNIT/ML injection Inject 10,000 Units into the skin every 30 (thirty) days.   . ferrous sulfate 325 (65 FE) MG tablet Take 325 mg by mouth in the morning and at bedtime.   . furosemide (LASIX) 20 MG tablet Take 20 mg by mouth every Monday, Wednesday, and Friday.  . haloperidol (HALDOL) 5 MG tablet Take 5 mg by mouth at bedtime.  . hydrALAZINE (APRESOLINE) 25 MG tablet Take 1 tablet (25 mg total) by mouth 2 (two) times daily.  .Marland KitcheninFLIXimab (REMICADE) 100 MG injection Infuse Remicade IV schedule 1 580mkg every 8 weeks Premedicate with Tylenol 500-65063my mouth and Benadryl 25-75m40m mouth prior to infusion. Last PPD was on 12/2009.   . isosorbide mononitrate (IMDUR) 30 MG 24 hr tablet Take 1 tablet (30 mg total) by mouth daily.  . ivabradine (CORLANOR) 5 MG TABS tablet Take 1 tablet (5 mg total) by mouth 2 (two) times daily with a meal.  . loperamide (IMODIUM) 2 MG capsule Take 1 capsule (2 mg total) by mouth 4 (four) times daily as needed for diarrhea or loose stools.  . magnesium oxide (MAG-OX) 400 MG tablet Take 2 tablets (800 mg total) by mouth in the morning, at noon, and at bedtime.  . metoprolol succinate (TOPROL-XL) 25 MG 24 hr  tablet Take 1 tablet (25 mg total) by mouth at bedtime.  Marland Kitchen omeprazole (PRILOSEC) 20 MG capsule Take 20 mg by mouth at bedtime.   . solifenacin (VESICARE) 5 MG tablet Take 5 mg by mouth daily.  . tamsulosin (FLOMAX) 0.4 MG CAPS capsule Take 1 capsule (0.4 mg total) by mouth daily.  Marland Kitchen triamcinolone (NASACORT ALLERGY 24HR) 55 MCG/ACT AERO nasal inhaler Place 2 sprays into the nose daily as needed (allergies).  . vitamin B-12 (CYANOCOBALAMIN) 500 MCG tablet Take 1,000 mcg by mouth daily.    No facility-administered encounter medications on file as of 01/24/2020.    Allergies (verified) Azathioprine, Ciprofloxacin, Levaquin [levofloxacin  in d5w], and Plendil [felodipine]   History: Past Medical History:  Diagnosis Date  . Anemia   . Anxiety   . Benign paroxysmal positional vertigo   . Bipolar I disorder, most recent episode (or current) unspecified   . Celiac disease   . CHF (congestive heart failure) ([place])   . Chronic kidney disease, stage III (moderate) ([facility])   . Crohn's    Remicade q8 weeks  . Depression   . Essential and other specified forms of tremor    medication-induced Parkinson's, now resolved  . Essential hypertension, benign   . Gout 2018  . Heart failure ([place])   . Hypertrophy of prostate without urinary obstruction and other lower urinary tract symptoms (LUTS)   . Impotence of organic origin   . Insomnia with sleep apnea, unspecified   . Iron deficiency anemia, unspecified   . 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 Neuralgia, neuritis, and radiculitis, unspecified   . Other B-complex deficiencies   . Other extrapyramidal disease and abnormal movement disorder   . Postinflammatory pulmonary fibrosis ([place])   . Tobacco use disorder   . Vertigo 2018   Past Surgical History:  Procedure Laterality Date  . CATARACT EXTRACTION, BILATERAL Bilateral 09/2018  . CHOLECYSTECTOMY  07-12-2010  . ICD IMPLANT N/A 01/02/2020   Procedure: ICD IMPLANT;  Surgeon: Evans Lance, MD;  Location: [facility];  Service: Cardiovascular;  Laterality: N/A;  . RIGHT HEART CATH N/A 10/12/2018   Procedure: RIGHT HEART CATH;  Surgeon: Jolaine Artist, MD;  Location: [facility];  Service: Cardiovascular;  Laterality: N/A;  . RIGHT/LEFT HEART CATH AND CORONARY ANGIOGRAPHY N/A 04/26/2019   Procedure: RIGHT/LEFT HEART CATH AND CORONARY ANGIOGRAPHY;  Surgeon: Larey Dresser, MD;  Location: [facility];  Service: Cardiovascular;  Laterality: N/A;  . SMALL INTESTINE SURGERY     x 2   Family History  Problem Relation Age of Onset  . Diabetes Mother        maternal grandmother  . Uterine cancer Mother   . Emphysema  Father   . Pneumonia Maternal Grandmother   . Colon cancer Neg Hx    Social History   Socioeconomic History  . Marital status: Divorced    Spouse name: Not on file  . Number of children: 1  . Years of education: 58  . Highest education level: Not on file  Occupational History  . Occupation: retired  . Occupation: Veteran  Tobacco Use  . Smoking status: Former Smoker    Packs/day: 1.00    Years: 49.00    Pack years: 49.00    Types: Cigarettes    Start date: 04/11/1956    Quit date: 04/17/2014    Years since quitting: 5.7  . Smokeless tobacco: Never Used  Vaping Use  . Vaping Use: Never used  Substance  and Sexual Activity  . Alcohol use: No    Alcohol/week: 0.0 standard drinks  . Drug use: No  . Sexual activity: Never  Other Topics Concern  . Not on file  Social History Narrative  . Not on file   Social Determinants of Health   Financial Resource Strain:   . Difficulty of Paying Living Expenses: Not on file  Food Insecurity:   . Worried About Charity fundraiser in the Last Year: Not on file  . Ran Out of Food in the Last Year: Not on file  Transportation Needs:   . Lack of Transportation (Medical): Not on file  . Lack of Transportation (Non-Medical): Not on file  Physical Activity:   . Days of Exercise per Week: Not on file  . Minutes of Exercise per Session: Not on file  Stress:   . Feeling of Stress : Not on file  Social Connections:   . Frequency of Communication with Friends and Family: Not on file  . Frequency of Social Gatherings with Friends and Family: Not on file  . Attends Religious Services: Not on file  . Active Member of Clubs or Organizations: Not on file  . Attends Archivist Meetings: Not on file  . Marital Status: Not on file    Tobacco Counseling Counseling given: Not Answered   Clinical Intake:  Pre-visit preparation completed: No  Pain : No/denies pain Pain Score: 0-No pain     BMI - recorded: 24.82 Nutritional  Status: BMI of 19-24  Normal Nutritional Risks: None Diabetes: No  How often do you need to have someone help you when you read instructions, pamphlets, or other written materials from your doctor or pharmacy?: 3 - Sometimes (has a friend who assist) What is the last grade level you completed in school?: 12 Grade  Diabetic? No   Interpreter Needed?: No  Information entered by :: Rishabh Rinkenberger FNP-C   Activities of Daily Living In your present state of health, do you have any difficulty performing the following activities: 01/24/2020 08/26/2019  Hearing? N N  Vision? N N  Difficulty concentrating or making decisions? N N  Walking or climbing stairs? Y Y  Comment - Balance issues  Dressing or bathing? N N  Doing errands, shopping? N N  Preparing Food and eating ? Y -  Comment has a Friend who is a Biomedical scientist who cooks for him. -  Using the Toilet? N -  In the past six months, have you accidently leaked urine? N -  Do you have problems with loss of bowel control? Y -  Comment x 1 episode but none since then. -  Managing your Medications? Y -  Comment has assistance -  Managing your Finances? N -  Housekeeping or managing your Housekeeping? N -  Some recent data might be hidden    Patient Care Team: Gayland Curry, DO as PCP - General (Geriatric Medicine) [place], Peter M, MD as PCP - Cardiology (Cardiology) Larey Dresser, MD as PCP - Advanced Heart Failure (Cardiology) Elmarie Shiley, MD (Nephrology) Milus Banister, MD (Gastroenterology)  Indicate any recent Medical Services you may have received from other than [facility] providers in the past year (date may be approximate).     Assessment:   This is a routine wellness examination for Satya.  Hearing/Vision screen  Hearing Screening   125Hz  250Hz  500Hz  1000Hz  2000Hz  3000Hz  4000Hz  6000Hz  8000Hz   Right ear:  Left ear:           Comments: No Hearing Concerns.   Vision Screening Comments: No Vision Concerns. Patient  wears prescription glasses. Last Eye Exam 03/27/2019.  Dietary issues and exercise activities discussed: Current Exercise Habits: The patient does not participate in regular exercise at present, Exercise limited by: None identified  Goals    . Increase water intake     Starting 01/03/16, I will attempt to increase my water intake to 8 glasses per day.     . Increase water intake     Increase water to 2-3 glasses a day    . Increase water intake     Patient will increase water intake to 2-3 glasses/day    . LIFESTYLE - ATTEND STRESS MANAGEMENT CLASSES     I will try to keep stress down       Depression Screen PHQ 2/9 Scores 01/24/2020 12/16/2019 10/20/2019 06/20/2019 05/06/2019 03/25/2019 03/16/2019  PHQ - 2 Score 0 0 0 0 0 0 0    Fall Risk Fall Risk  01/24/2020 12/16/2019 12/05/2019 10/24/2019 10/20/2019  Falls in the past year? 0 - 0 - 0  Number falls in past yr: 0 1 0 - 0  Injury with Fall? 0 0 0 - 0  Risk for fall due to : - - - History of fall(s);Impaired balance/gait;Impaired mobility;Medication side effect -  Risk for fall due to: Comment - - - - -  Follow up - - - Falls evaluation completed;Education provided;Falls prevention discussed -    Any stairs in or around the home? Yes  If so, are there any without handrails? No  Home free of loose throw rugs in walkways, pet beds, electrical cords, etc? No  Adequate lighting in your home to reduce risk of falls? Yes   ASSISTIVE DEVICES UTILIZED TO PREVENT FALLS:  Life alert? No  Use of a cane, walker or w/c? No  Grab bars in the bathroom? No  Shower chair or bench in shower? No  Elevated toilet seat or a handicapped toilet? No   TIMED UP AND GO:  Was the test performed? No .  Length of time to ambulate 10 feet: N/A sec.   Gait slow and steady without use of assistive device  Cognitive Function: MMSE - Mini Mental State Exam 01/13/2018 01/05/2017 01/03/2016  Orientation to time 5 5 5   Orientation to Place 5 5 5    Registration 3 3 3   Attention/ Calculation 3 3 4   Recall 0 0 3  Language- name 2 objects 2 2 2   Language- repeat 1 1 1   Language- follow 3 step command 3 3 2   Language- read & follow direction 1 1 1   Write a sentence 1 1 1   Copy design 1 1 1   Total score 25 25 28      6CIT Screen 01/24/2020 01/19/2019  What Year? 0 points 0 points  What month? 0 points 0 points  What time? 0 points 0 points  Count back from 20 0 points 4 points  Months in reverse 4 points 4 points  Repeat phrase 8 points 4 points  Total Score 12 12    Immunizations Immunization History  Administered Date(s) Administered  . Fluad Quad(high Dose 65+) 11/15/2018  . Influenza, High Dose Seasonal PF 11/26/2016  . Influenza,inj,Quad PF,6+ Mos 11/13/2014, 01/03/2016  . Influenza-Unspecified 10/25/2012, 11/24/2016, 11/24/2017  . Moderna SARS-COVID-2 Vaccination 04/20/2019, 05/18/2019, 10/25/2019  . PPD Test 12/30/2010, 01/05/2012, 01/07/2013, 01/13/2014  . Pneumococcal Conjugate-13 08/10/2014  .  Pneumococcal Polysaccharide-23 03/05/2004, 01/03/2016, 05/06/2018  . Tdap 05/26/2011    TDAP status: Up to date Flu Vaccine status: Up to date Pneumococcal vaccine status: Up to date Covid-19 vaccine status: Completed vaccines  Qualifies for Shingles Vaccine? Yes   Zostavax completed No   Shingrix Completed?: No.    Education has been provided regarding the importance of this vaccine. Patient has been advised to call insurance company to determine out of pocket expense if they have not yet received this vaccine. Advised may also receive vaccine at local pharmacy or Health Dept. Verbalized acceptance and understanding.  Screening Tests Health Maintenance  Topic Date Due  . INFLUENZA VACCINE  09/25/2019  . HEMOGLOBIN A1C  02/27/2020  . FOOT EXAM  08/09/2020  . OPHTHALMOLOGY EXAM  10/12/2020  . TETANUS/TDAP  05/25/2021  . COLONOSCOPY  09/17/2027  . COVID-19 Vaccine  Completed  . Hepatitis C Screening  Completed   . PNA vac Low Risk Adult  Completed    Health Maintenance  Health Maintenance Due  Topic Date Due  . INFLUENZA VACCINE  09/25/2019    Colorectal cancer screening: Completed 01/17/2020 . Repeat every 1 years  Lung Cancer Screening: (Low Dose CT Chest recommended if Age 6-80 years, 30 pack-year currently smoking OR have quit w/in 15years.) does not qualify.   Lung Cancer Screening Referral: No  Additional Screening:  Hepatitis C Screening: does not qualify; Completed yes   Vision Screening: Recommended annual ophthalmology exams for early detection of glaucoma and other disorders of the eye. Is the patient up to date with their annual eye exam?  Yes  Who is the provider or what is the name of the office in which the patient attends annual eye exams? Dr.Shapiro  If pt is not established with a provider, would they like to be referred to a provider to establish care? No .   Dental Screening: Recommended annual dental exams for proper oral hygiene  Community Resource Referral / Chronic Care Management: CRR required this visit?  No   CCM required this visit?  No     Plan:   -  Advised to get Shigrix vaccine at his pharmacy.  I have personally reviewed and noted the following in the patient's chart:   . Medical and social history . Use of alcohol, tobacco or illicit drugs  . Current medications and supplements . Functional ability and status . Nutritional status . Physical activity . Advanced directives . List of other physicians . Hospitalizations, surgeries, and ER visits in previous 12 months . Vitals . Screenings to include cognitive, depression, and falls . Referrals and appointments  In addition, I have reviewed and discussed with patient certain preventive protocols, quality metrics, and best practice recommendations. A written personalized care plan for preventive services as well as general preventive health recommendations were provided to patient.    Sandrea Hughs, NP   01/24/2020   Nurse Notes:Advised to get shigrix vaccine at his pharmacy.

## 2020-01-24 NOTE — Telephone Encounter (Signed)
Mr. [name] are scheduled for a virtual visit with your provider today.    Just as we do with appointments in the office, we must obtain your consent to participate.  Your consent will be active for this visit and any virtual visit you may have with one of our providers in the next 365 days.    If you have a MyChart account, I can also send a copy of this consent to you electronically.  All virtual visits are billed to your insurance company just like a traditional visit in the office.  As this is a virtual visit, video technology does not allow for your provider to perform a traditional examination.  This may limit your provider's ability to fully assess your condition.  If your provider identifies any concerns that need to be evaluated in person or the need to arrange testing such as labs, EKG, etc, we will make arrangements to do so.    Although advances in technology are sophisticated, we cannot ensure that it will always work on either your end or our end.  If the connection with a video visit is poor, we may have to switch to a telephone visit.  With either a video or telephone visit, we are not always able to ensure that we have a secure connection.   I need to obtain your verbal consent now.   Are you willing to proceed with your visit today?   Brent [name] has provided verbal consent on 01/24/2020 for a virtual visit (video or telephone).   [name], [place] 01/24/2020  2:15 PM

## 2020-01-25 ENCOUNTER — Other Ambulatory Visit (HOSPITAL_COMMUNITY): Payer: Self-pay

## 2020-01-25 NOTE — H&P (Signed)
I have seen Brent [name] is a 74 y.o. male in the office today who has been referred by Dr. [name] for consideration of ICD implant for primary prevention of sudden death.  The patient's chart has been reviewed and they meet criteria for ICD implant.  I have had a thorough discussion with the patient reviewing options.  The patient and their family (if available) have had opportunities to ask questions and have them answered. The patient and I have decided together through a shared decision making process to proceed with ICD at this time.  Risks, benefits, alternatives to ICD implantation were discussed in detail with the patient today. The patient  understands that the risks include but are not limited to bleeding, infection, pneumothorax, perforation, tamponade, vascular damage, renal failure, MI, stroke, death, inappropriate shocks, and lead dislodgement and he wishes to proceed.  We will therefore schedule device implantation at the next available time.  Carleene Overlie Romesha Scherer,MD

## 2020-01-25 NOTE — Progress Notes (Signed)
Paramedicine Encounter    Patient ID: Brent [name], male    DOB: 11-21-1945, 74 y.o.   MRN: [id number]   Patient Care Team: Gayland Curry, DO as PCP - General (Geriatric Medicine) Martinique, Peter M, MD as PCP - Cardiology (Cardiology) Larey Dresser, MD as PCP - Advanced Heart Failure (Cardiology) Elmarie Shiley, MD (Nephrology) Milus Banister, MD (Gastroenterology)  Patient Active Problem List   Diagnosis Date Noted  . Chronic systolic heart failure ([place]) 01/02/2020  . Decreased appetite 10/08/2019  . Weakness 10/08/2019  . Hyperkalemia 08/26/2019  . Chronic systolic CHF (congestive heart failure) ([place]) 08/26/2019  . Type 2 diabetes mellitus with stage 3 chronic kidney disease ([place]) 08/26/2019  . Chronic obstructive pulmonary disease ([place]) 06/20/2019  . Ventricular tachycardia ([place]) 06/20/2019  . Acute on chronic systolic (congestive) heart failure ([place]) 04/25/2019  . Metabolic acidosis 09/47/0962  . Orthostasis 09/07/2018  . AKI (acute kidney injury) ([place])   . Immunosuppressed status ([place])   . Macrocytic anemia   . Chronic combined systolic and diastolic congestive heart failure ([place]) 06/03/2018  . Candida esophagitis ([place]) 09/22/2017  . History of smoking 30 or more pack years 01/05/2017  . Bipolar 1 disorder ([place])   . BPH (benign prostatic hyperplasia) 03/31/2013  . 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 Essential hypertension, benign 03/31/2013  . Acute renal failure superimposed on stage 3 chronic kidney disease ([place]) 06/04/2012  . 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[place]) 01/23/2010    Current Outpatient Medications:  .  acetaminophen (TYLENOL) 160 MG/5ML liquid, Take 325 mg by mouth every 4 (four) hours as needed for fever., Disp: , Rfl:  .  allopurinol (ZYLOPRIM) 100 MG tablet, Take 2 tablets (200 mg total) by mouth daily., Disp: 60 tablet, Rfl: 5 .  aspirin EC 81 MG tablet, Take 1 tablet (81 mg total) by mouth daily., Disp: 90 tablet, Rfl: 3 .  Cholecalciferol  (VITAMIN D) 125 MCG (5000 UT) CAPS, Take 5,000 Units by mouth daily. , Disp: , Rfl:  .  dapagliflozin propanediol (FARXIGA) 10 MG TABS tablet, Take 1 tablet (10 mg total) by mouth daily before breakfast., Disp: 30 tablet, Rfl: 6 .  divalproex (DEPAKOTE ER) 500 MG 24 hr tablet, Take 1,500 mg by mouth at bedtime. , Disp: , Rfl:  .  epoetin alfa (PROCRIT) 83662 UNIT/ML injection, Inject 10,000 Units into the skin every 30 (thirty) days. , Disp: , Rfl:  .  ferrous sulfate 325 (65 FE) MG tablet, Take 325 mg by mouth in the morning and at bedtime. , Disp: , Rfl:  .  furosemide (LASIX) 20 MG tablet, Take 20 mg by mouth every Monday, Wednesday, and Friday., Disp: , Rfl:  .  haloperidol (HALDOL) 5 MG tablet, Take 5 mg by mouth at bedtime., Disp: , Rfl:  .  hydrALAZINE (APRESOLINE) 25 MG tablet, Take 1 tablet (25 mg total) by mouth 2 (two) times daily., Disp: 60 tablet, Rfl: 3 .  inFLIXimab (REMICADE) 100 MG injection, Infuse Remicade IV schedule 1 23m/kg every 8 weeks Premedicate with Tylenol 500-6534mby mouth and Benadryl 25-5062my mouth prior to infusion. Last PPD was on 12/2009. , Disp: 1 each, Rfl: 6 .  isosorbide mononitrate (IMDUR) 30 MG 24 hr tablet, Take 1 tablet (30 mg total) by mouth daily., Disp: 90 tablet, Rfl: 3 .  ivabradine (CORLANOR) 5 MG TABS tablet, Take 1 tablet (5 mg total) by mouth 2 (two) times daily with a meal., Disp: 180 tablet, Rfl: 3 .  loperamide (IMODIUM) 2 MG capsule, Take 1 capsule (2 mg total) by mouth 4 (four) times daily as needed for diarrhea or loose stools., Disp: 12 capsule, Rfl: 0 .  magnesium oxide (MAG-OX) 400 MG tablet, Take 2 tablets (800 mg total) by mouth in the morning, at noon, and at bedtime., Disp: 60 tablet, Rfl: 2 .  metoprolol succinate (TOPROL-XL) 25 MG 24 hr tablet, Take 1 tablet (25 mg total) by mouth at bedtime., Disp: 30 tablet, Rfl: 6 .  omeprazole (PRILOSEC) 20 MG capsule, Take 20 mg by mouth at bedtime. , Disp: , Rfl:  .  solifenacin (VESICARE) 5 MG  tablet, Take 5 mg by mouth daily., Disp: , Rfl:  .  tamsulosin (FLOMAX) 0.4 MG CAPS capsule, Take 1 capsule (0.4 mg total) by mouth daily., Disp: 30 capsule, Rfl: 2 .  triamcinolone (NASACORT ALLERGY 24HR) 55 MCG/ACT AERO nasal inhaler, Place 2 sprays into the nose daily as needed (allergies)., Disp: , Rfl:  .  vitamin B-12 (CYANOCOBALAMIN) 500 MCG tablet, Take 1,000 mcg by mouth daily. , Disp: , Rfl:  Allergies  Allergen Reactions  . Azathioprine Other (See Comments)    REACTION: affected WBC "Almost died"  . Ciprofloxacin Other (See Comments)    Unknown rxn  . Levaquin [Levofloxacin In D5w] Other (See Comments)    Unknown rxn  . Plendil [Felodipine] Other (See Comments)    Unknown rxn     Social History   Socioeconomic History  . Marital status: Divorced    Spouse name: Not on file  . Number of children: 1  . Years of education: 63  . Highest education level: Not on file  Occupational History  . Occupation: retired  . Occupation: Veteran  Tobacco Use  . Smoking status: Former Smoker    Packs/day: 1.00    Years: 49.00    Pack years: 49.00    Types: Cigarettes    Start date: 04/11/1956    Quit date: 04/17/2014    Years since quitting: 5.7  . Smokeless tobacco: Never Used  Vaping Use  . Vaping Use: Never used  Substance and Sexual Activity  . Alcohol use: No    Alcohol/week: 0.0 standard drinks  . Drug use: No  . Sexual activity: Never  Other Topics Concern  . Not on file  Social History Narrative  . Not on file   Social Determinants of Health   Financial Resource Strain:   . Difficulty of Paying Living Expenses: Not on file  Food Insecurity:   . Worried About Charity fundraiser in the Last Year: Not on file  . Ran Out of Food in the Last Year: Not on file  Transportation Needs:   . Lack of Transportation (Medical): Not on file  . Lack of Transportation (Non-Medical): Not on file  Physical Activity:   . Days of Exercise per Week: Not on file  . Minutes of  Exercise per Session: Not on file  Stress:   . Feeling of Stress : Not on file  Social Connections:   . Frequency of Communication with Friends and Family: Not on file  . Frequency of Social Gatherings with Friends and Family: Not on file  . Attends Religious Services: Not on file  . Active Member of Clubs or Organizations: Not on file  . Attends Archivist Meetings: Not on file  . Marital Status: Not on file  Intimate Partner Violence:   . Fear of Current or Ex-Partner: Not on file  . Emotionally  Abused: Not on file  . Physically Abused: Not on file  . Sexually Abused: Not on file    Physical Exam Vitals reviewed.  Constitutional:      Appearance: He is normal weight.  HENT:     Head: Normocephalic.     Nose: Nose normal.     Mouth/Throat:     Mouth: Mucous membranes are moist.     Pharynx: Oropharynx is clear.  Eyes:     Pupils: Pupils are equal, round, and reactive to light.  Cardiovascular:     Rate and Rhythm: Normal rate and regular rhythm.     Pulses: Normal pulses.     Heart sounds: Normal heart sounds.  Pulmonary:     Effort: Pulmonary effort is normal.     Breath sounds: Normal breath sounds.  Abdominal:     General: Abdomen is flat.     Palpations: Abdomen is soft.  Musculoskeletal:        General: Swelling present. Normal range of motion.     Cervical back: Normal range of motion.     Right lower leg: Edema present.     Left lower leg: No edema.  Skin:    General: Skin is warm and dry.     Capillary Refill: Capillary refill takes less than 2 seconds.  Neurological:     General: No focal deficit present.     Mental Status: He is alert. Mental status is at baseline.  Psychiatric:        Mood and Affect: Mood normal.     Arrived for home visit for Brent [name] who was alert and oriented reporting he was feeling good. Brent [name] stated he ate a lot of salt over the holiday and feels like his legs are swollen. On assessment some right lower leg edema was  noted. Weight did not increase from last week. Vitals were obtained and are as noted. Brent [name] was compliant with his medications over the last week. I reviewed medications and verified same, filling pill box accordingly. [facility] took Brent [name] off Haldol and placed him on Seroquel at night, starting same 1/2 tab at bedtime for 5 days then increased to 1 tab at bedtime. I advised Brent [name] to report any symptoms or side effects to me with starting the new medications. Brent [name] understood and agreed with same. I reviewed appointments with patients and wrote down same in patient planner. I also educated patient on importance of low sodium diet including beverages like soda and juice in which he drinks often. Brent [name] agreed with plan and reports he will do better. I will see Brent [name] in one week. Home visit complete.   Refills- NONE     Future Appointments  Date Time Provider [place]  02/15/2020 10:00 AM [facility] None  02/23/2020 11:00 AM Reed, Tiffany L, DO [facility] None  02/27/2020  1:40 PM Larey Dresser, MD [facility] None  04/03/2020  7:00 AM [facility] DEVICE REMOTES [facility]  04/12/2020  2:15 PM Evans Lance, MD [facility]  07/03/2020  7:00 AM [facility] DEVICE REMOTES [facility]  10/02/2020  7:00 AM [facility] DEVICE REMOTES [facility]  01/01/2021  7:00 AM [facility] DEVICE REMOTES [facility]  01/29/2021  1:00 PM [name], Dinah C, NP [facility] None  04/02/2021  7:00 AM [facility] DEVICE REMOTES [facility]  07/02/2021  7:00 AM [facility] DEVICE REMOTES [facility]     ACTION: Home visit completed Next visit planned for one wee

## 2020-02-01 ENCOUNTER — Other Ambulatory Visit (HOSPITAL_COMMUNITY): Payer: Self-pay

## 2020-02-01 NOTE — Progress Notes (Signed)
Paramedicine Encounter    Patient ID: Brent [name], male    DOB: 1945/03/19, 74 y.o.   MRN: [id number]   Patient Care Team: Gayland Curry, DO as PCP - General (Geriatric Medicine) Martinique, Peter M, MD as PCP - Cardiology (Cardiology) Larey Dresser, MD as PCP - Advanced Heart Failure (Cardiology) Elmarie Shiley, MD (Nephrology) Milus Banister, MD (Gastroenterology)  Patient Active Problem List   Diagnosis Date Noted  . Chronic systolic heart failure ([place]) 01/02/2020  . Decreased appetite 10/08/2019  . Weakness 10/08/2019  . Hyperkalemia 08/26/2019  . Chronic systolic CHF (congestive heart failure) ([place]) 08/26/2019  . Type 2 diabetes mellitus with stage 3 chronic kidney disease ([place]) 08/26/2019  . Chronic obstructive pulmonary disease ([place]) 06/20/2019  . Ventricular tachycardia ([place]) 06/20/2019  . Acute on chronic systolic (congestive) heart failure ([place]) 04/25/2019  . Metabolic acidosis 11/94/1740  . Orthostasis 09/07/2018  . AKI (acute kidney injury) ([place])   . Immunosuppressed status ([place])   . Macrocytic anemia   . Chronic combined systolic and diastolic congestive heart failure ([place]) 06/03/2018  . Candida esophagitis ([place]) 09/22/2017  . History of smoking 30 or more pack years 01/05/2017  . Bipolar 1 disorder ([place])   . BPH (benign prostatic hyperplasia) 03/31/2013  . 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 Essential hypertension, benign 03/31/2013  . Acute renal failure superimposed on stage 3 chronic kidney disease ([place]) 06/04/2012  . 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[place]) 01/23/2010    Current Outpatient Medications:  .  acetaminophen (TYLENOL) 160 MG/5ML liquid, Take 325 mg by mouth every 4 (four) hours as needed for fever., Disp: , Rfl:  .  allopurinol (ZYLOPRIM) 100 MG tablet, Take 2 tablets (200 mg total) by mouth daily., Disp: 60 tablet, Rfl: 5 .  aspirin EC 81 MG tablet, Take 1 tablet (81 mg total) by mouth daily., Disp: 90 tablet, Rfl: 3 .  Cholecalciferol  (VITAMIN D) 125 MCG (5000 UT) CAPS, Take 5,000 Units by mouth daily. , Disp: , Rfl:  .  dapagliflozin propanediol (FARXIGA) 10 MG TABS tablet, Take 1 tablet (10 mg total) by mouth daily before breakfast., Disp: 30 tablet, Rfl: 6 .  divalproex (DEPAKOTE ER) 500 MG 24 hr tablet, Take 1,500 mg by mouth at bedtime. , Disp: , Rfl:  .  epoetin alfa (PROCRIT) 81448 UNIT/ML injection, Inject 10,000 Units into the skin every 30 (thirty) days. , Disp: , Rfl:  .  ferrous sulfate 325 (65 FE) MG tablet, Take 325 mg by mouth in the morning and at bedtime. , Disp: , Rfl:  .  furosemide (LASIX) 20 MG tablet, Take 20 mg by mouth every Monday, Wednesday, and Friday., Disp: , Rfl:  .  haloperidol (HALDOL) 5 MG tablet, Take 5 mg by mouth at bedtime., Disp: , Rfl:  .  hydrALAZINE (APRESOLINE) 25 MG tablet, Take 1 tablet (25 mg total) by mouth 2 (two) times daily., Disp: 60 tablet, Rfl: 3 .  inFLIXimab (REMICADE) 100 MG injection, Infuse Remicade IV schedule 1 80m/kg every 8 weeks Premedicate with Tylenol 500-6572mby mouth and Benadryl 25-5075my mouth prior to infusion. Last PPD was on 12/2009. , Disp: 1 each, Rfl: 6 .  isosorbide mononitrate (IMDUR) 30 MG 24 hr tablet, Take 1 tablet (30 mg total) by mouth daily., Disp: 90 tablet, Rfl: 3 .  ivabradine (CORLANOR) 5 MG TABS tablet, Take 1 tablet (5 mg total) by mouth 2 (two) times daily with a meal., Disp: 180 tablet, Rfl: 3 .  loperamide (IMODIUM) 2 MG capsule, Take 1 capsule (2 mg total) by mouth 4 (four) times daily as needed for diarrhea or loose stools., Disp: 12 capsule, Rfl: 0 .  magnesium oxide (MAG-OX) 400 MG tablet, Take 2 tablets (800 mg total) by mouth in the morning, at noon, and at bedtime. (Patient not taking: Reported on 01/25/2020), Disp: 60 tablet, Rfl: 2 .  metoprolol succinate (TOPROL-XL) 25 MG 24 hr tablet, Take 1 tablet (25 mg total) by mouth at bedtime., Disp: 30 tablet, Rfl: 6 .  omeprazole (PRILOSEC) 20 MG capsule, Take 20 mg by mouth at bedtime. ,  Disp: , Rfl:  .  solifenacin (VESICARE) 5 MG tablet, Take 5 mg by mouth daily., Disp: , Rfl:  .  tamsulosin (FLOMAX) 0.4 MG CAPS capsule, Take 1 capsule (0.4 mg total) by mouth daily., Disp: 30 capsule, Rfl: 2 .  triamcinolone (NASACORT ALLERGY 24HR) 55 MCG/ACT AERO nasal inhaler, Place 2 sprays into the nose daily as needed (allergies)., Disp: , Rfl:  .  vitamin B-12 (CYANOCOBALAMIN) 500 MCG tablet, Take 1,000 mcg by mouth daily. , Disp: , Rfl:  Allergies  Allergen Reactions  . Azathioprine Other (See Comments)    REACTION: affected WBC "Almost died"  . Ciprofloxacin Other (See Comments)    Unknown rxn  . Levaquin [Levofloxacin In D5w] Other (See Comments)    Unknown rxn  . Plendil [Felodipine] Other (See Comments)    Unknown rxn     Social History   Socioeconomic History  . Marital status: Divorced    Spouse name: Not on file  . Number of children: 1  . Years of education: 41  . Highest education level: Not on file  Occupational History  . Occupation: retired  . Occupation: Veteran  Tobacco Use  . Smoking status: Former Smoker    Packs/day: 1.00    Years: 49.00    Pack years: 49.00    Types: Cigarettes    Start date: 04/11/1956    Quit date: 04/17/2014    Years since quitting: 5.7  . Smokeless tobacco: Never Used  Vaping Use  . Vaping Use: Never used  Substance and Sexual Activity  . Alcohol use: No    Alcohol/week: 0.0 standard drinks  . Drug use: No  . Sexual activity: Never  Other Topics Concern  . Not on file  Social History Narrative  . Not on file   Social Determinants of Health   Financial Resource Strain:   . Difficulty of Paying Living Expenses: Not on file  Food Insecurity:   . Worried About Charity fundraiser in the Last Year: Not on file  . Ran Out of Food in the Last Year: Not on file  Transportation Needs:   . Lack of Transportation (Medical): Not on file  . Lack of Transportation (Non-Medical): Not on file  Physical Activity:   . Days of  Exercise per Week: Not on file  . Minutes of Exercise per Session: Not on file  Stress:   . Feeling of Stress : Not on file  Social Connections:   . Frequency of Communication with Friends and Family: Not on file  . Frequency of Social Gatherings with Friends and Family: Not on file  . Attends Religious Services: Not on file  . Active Member of Clubs or Organizations: Not on file  . Attends Archivist Meetings: Not on file  . Marital Status: Not on file  Intimate Partner Violence:   . Fear of Current or Ex-Partner:  Not on file  . Emotionally Abused: Not on file  . Physically Abused: Not on file  . Sexually Abused: Not on file    Physical Exam Vitals reviewed.  Constitutional:      Appearance: He is normal weight.  HENT:     Head: Normocephalic.     Nose: Nose normal.     Mouth/Throat:     Mouth: Mucous membranes are moist.     Pharynx: Oropharynx is clear.  Eyes:     Pupils: Pupils are equal, round, and reactive to light.  Cardiovascular:     Rate and Rhythm: Normal rate and regular rhythm.     Pulses: Normal pulses.     Heart sounds: Normal heart sounds.  Pulmonary:     Effort: Pulmonary effort is normal.     Breath sounds: Normal breath sounds.  Abdominal:     General: Abdomen is flat.     Palpations: Abdomen is soft.  Musculoskeletal:        General: Normal range of motion.     Cervical back: Normal range of motion.     Right lower leg: No edema.     Left lower leg: No edema.  Skin:    General: Skin is warm and dry.     Capillary Refill: Capillary refill takes less than 2 seconds.  Neurological:     General: No focal deficit present.     Mental Status: He is alert. Mental status is at baseline.  Psychiatric:        Mood and Affect: Mood normal.     Arrived for home visit for Joffre who was alert and oriented ambulating around his home. Butler reports him feeling good today. Lam was compliant with all medication. Adithya' GI doctor started him on a  new medication to treat his Crohns disease called Budesonide. I added same to medication list. Medications were verified and confirmed. Pill box filled accordingly. Vitals were obtained. No edema noted. Lung sounds clear. Appointments were reviewed with Jeneen Rinks. All appointments were listed within his planner. Home visit complete and scheduled for next week follow up.  NO REFILLS.     Future Appointments  Date Time Provider [place]  02/02/2020  9:30 AM [name], [name], NP [facility] None  02/15/2020 10:00 AM [facility] None  02/23/2020 11:00 AM Reed, Tiffany L, DO [facility] None  02/27/2020  1:40 PM Larey Dresser, MD [facility] None  04/03/2020  7:00 AM [facility] DEVICE REMOTES [facility]  04/12/2020  2:15 PM Evans Lance, MD [facility]  07/03/2020  7:00 AM [facility] DEVICE REMOTES [facility]  10/02/2020  7:00 AM [facility] DEVICE REMOTES [facility]  01/01/2021  7:00 AM [facility] DEVICE REMOTES [facility]  01/29/2021  1:00 PM [name], Dinah C, NP [facility] None  04/02/2021  7:00 AM [facility] DEVICE REMOTES [facility]  07/02/2021  7:00 AM [facility] DEVICE REMOTES [facility]     ACTION: Home visit completed Next visit planned for one week

## 2020-02-02 ENCOUNTER — Encounter: Payer: Self-pay | Admitting: Family

## 2020-02-02 ENCOUNTER — Other Ambulatory Visit: Payer: Self-pay

## 2020-02-02 ENCOUNTER — Ambulatory Visit (INDEPENDENT_AMBULATORY_CARE_PROVIDER_SITE_OTHER): Payer: Medicare Other | Admitting: Family

## 2020-02-02 VITALS — BP 120/88 | HR 70 | Temp 97.7°F | Resp 18 | Ht 76.0 in | Wt 177.2 lb

## 2020-02-02 DIAGNOSIS — R630 Anorexia: Secondary | ICD-10-CM

## 2020-02-02 DIAGNOSIS — F331 Major depressive disorder, recurrent, moderate: Secondary | ICD-10-CM

## 2020-02-02 MED ORDER — MIRTAZAPINE 7.5 MG PO TABS: 7.5000 mg | ORAL_TABLET | Freq: Every day | ORAL | 0 refills | Status: DC

## 2020-02-02 NOTE — Patient Instructions (Signed)
Mirtazapine tablets What is this medicine? MIRTAZAPINE (mir TAZ a peen) is used to treat depression. This medicine may be used for other purposes; ask your health care provider or pharmacist if you have questions. COMMON BRAND NAME(S): Remeron What should I tell my health care provider before I take this medicine? They need to know if you have any of these conditions:  bipolar disorder  glaucoma  kidney disease  liver disease  suicidal thoughts  an unusual or allergic reaction to mirtazapine, other medicines, foods, dyes, or preservatives  pregnant or trying to get pregnant  breast-feeding How should I use this medicine? Take this medicine by mouth with a glass of water. Follow the directions on the prescription label. Take your medicine at regular intervals. Do not take your medicine more often than directed. Do not stop taking this medicine suddenly except upon the advice of your doctor. Stopping this medicine too quickly may cause serious side effects or your condition may worsen. A special MedGuide will be given to you by the pharmacist with each prescription and refill. Be sure to read this information carefully each time. Talk to your pediatrician regarding the use of this medicine in children. Special care may be needed. Overdosage: If you think you have taken too much of this medicine contact a poison control center or emergency room at once. NOTE: This medicine is only for you. Do not share this medicine with others. What if I miss a dose? If you miss a dose, take it as soon as you can. If it is almost time for your next dose, take only that dose. Do not take double or extra doses. What may interact with this medicine? Do not take this medicine with any of the following medications:  linezolid  MAOIs like Carbex, Eldepryl, Marplan, Nardil, and Parnate  methylene blue (injected into a vein) This medicine may also interact with the following  medications:  alcohol  antiviral medicines for HIV or AIDS  certain medicines that treat or prevent blood clots like warfarin  certain medicines for depression, anxiety, or psychotic disturbances  certain medicines for fungal infections like ketoconazole and itraconazole  certain medicines for migraine headache like almotriptan, eletriptan, frovatriptan, naratriptan, rizatriptan, sumatriptan, zolmitriptan  certain medicines for seizures like carbamazepine or phenytoin  certain medicines for sleep  cimetidine  erythromycin  fentanyl  lithium  medicines for blood pressure  nefazodone  rasagiline  rifampin  supplements like St. John's wort, kava kava, valerian  tramadol  tryptophan This list may not describe all possible interactions. Give your health care provider a list of all the medicines, herbs, non-prescription drugs, or dietary supplements you use. Also tell them if you smoke, drink alcohol, or use illegal drugs. Some items may interact with your medicine. What should I watch for while using this medicine? Tell your doctor if your symptoms do not get better or if they get worse. Visit your doctor or health care professional for regular checks on your progress. Because it may take several weeks to see the full effects of this medicine, it is important to continue your treatment as prescribed by your doctor. Patients and their families should watch out for new or worsening thoughts of suicide or depression. Also watch out for sudden changes in feelings such as feeling anxious, agitated, panicky, irritable, hostile, aggressive, impulsive, severely restless, overly excited and hyperactive, or not being able to sleep. If this happens, especially at the beginning of treatment or after a change in dose, call your health  care professional. Dennis Bast may get drowsy or dizzy. Do not drive, use machinery, or do anything that needs mental alertness until you know how this medicine  affects you. Do not stand or sit up quickly, especially if you are an older patient. This reduces the risk of dizzy or fainting spells. Alcohol may interfere with the effect of this medicine. Avoid alcoholic drinks. This medicine may cause dry eyes and blurred vision. If you wear contact lenses you may feel some discomfort. Lubricating drops may help. See your eye doctor if the problem does not go away or is severe. Your mouth may get dry. Chewing sugarless gum or sucking hard candy, and drinking plenty of water may help. Contact your doctor if the problem does not go away or is severe. What side effects may I notice from receiving this medicine? Side effects that you should report to your doctor or health care professional as soon as possible:  allergic reactions like skin rash, itching or hives, swelling of the face, lips, or tongue  anxious  changes in vision  chest pain  confusion  elevated mood, decreased need for sleep, racing thoughts, impulsive behavior  eye pain  fast, irregular heartbeat  feeling faint or lightheaded, falls  feeling agitated, angry, or irritable  fever or chills, sore throat  hallucination, loss of contact with reality  loss of balance or coordination  mouth sores  redness, blistering, peeling or loosening of the skin, including inside the mouth  restlessness, pacing, inability to keep still  seizures  stiff muscles  suicidal thoughts or other mood changes  trouble passing urine or change in the amount of urine  trouble sleeping  unusual bleeding or bruising  unusually weak or tired  vomiting Side effects that usually do not require medical attention (report to your doctor or health care professional if they continue or are bothersome):  change in appetite  constipation  dizziness  dry mouth  muscle aches or pains  nausea  tired  weight gain This list may not describe all possible side effects. Call your doctor for  medical advice about side effects. You may report side effects to FDA at 1-800-FDA-1088. Where should I keep my medicine? Keep out of the reach of children. Store at room temperature between 15 and 30 degrees C (59 and 86 degrees F) Protect from light and moisture. Throw away any unused medicine after the expiration date. NOTE: This sheet is a summary. It may not cover all possible information. If you have questions about this medicine, talk to your doctor, pharmacist, or health care provider.  2020 Elsevier/Gold Standard (2015-07-12 17:30:45) Major Depressive Disorder, Adult Major depressive disorder (MDD) is a mental health condition. MDD often makes you feel sad, hopeless, or helpless. MDD can also cause symptoms in your body. MDD can affect your:  Work.  School.  Relationships.  Other normal activities. MDD can range from mild to very bad. It may occur once (single episode MDD). It can also occur many times (recurrent MDD). The main symptoms of MDD often include:  Feeling sad, depressed, or irritable most of the time.  Loss of interest. MDD symptoms also include:  Sleeping too much or too little.  Eating too much or too little.  A change in your weight.  Feeling tired (fatigue) or having low energy.  Feeling worthless.  Feeling guilty.  Trouble making decisions.  Trouble thinking clearly.  Thoughts of suicide or harming others.  Feeling weak.  Feeling agitated.  Keeping yourself from being  around other people (isolation). Follow these instructions at home: Activity  Do these things as told by your doctor: ? Go back to your normal activities. ? Exercise regularly. ? Spend time outdoors. Alcohol  Talk with your doctor about how alcohol can affect your antidepressant medicines.  Do not drink alcohol. Or, limit how much alcohol you drink. ? This means no more than 1 drink a day for nonpregnant women and 2 drinks a day for men. One drink equals one of  these:  12 oz of beer.  5 oz of wine.  1 oz of hard liquor. General instructions  Take over-the-counter and prescription medicines only as told by your doctor.  Eat a healthy diet.  Get plenty of sleep.  Find activities that you enjoy. Make time to do them.  Think about joining a support group. Your doctor may be able to suggest a group for you.  Keep all follow-up visits as told by your doctor. This is important. Where to find more information:  [employer] on Mental Illness: ? www.nami.[place]: ? https://carter.com/  National Suicide Prevention Lifeline: ? (857) 324-1369. This is free, 24-hour help. Contact a doctor if:  Your symptoms get worse.  You have new symptoms. Get help right away if:  You self-harm.  You see, hear, taste, smell, or feel things that are not present (hallucinate). If you ever feel like you may hurt yourself or others, or have thoughts about taking your own life, get help right away. You can go to your nearest emergency department or call:  Your local emergency services (911 in the [place]  A suicide crisis helpline, such as the National Suicide Prevention Lifeline: ? 570-489-0803. This is open 24 hours a day. This information is not intended to replace advice given to you by your health care provider. Make sure you discuss any questions you have with your health care provider. Document Revised: 01/23/2017 Document Reviewed: 10/28/2015 Elsevier Patient Education  2020 [employer].

## 2020-02-02 NOTE — Progress Notes (Signed)
Provider: Effie Janoski FNP-C  Gayland Curry, DO  Patient Care Team: Gayland Curry, DO as PCP - General (Geriatric Medicine) Martinique, Peter M, MD as PCP - Cardiology (Cardiology) Larey Dresser, MD as PCP - Advanced Heart Failure (Cardiology) Elmarie Shiley, MD (Nephrology) Milus Banister, MD (Gastroenterology)  Extended Emergency Contact Information Primary Emergency Contact: [facility] Phone: [phone] Mobile Phone: [phone] Relation: Sister Secondary Emergency Contact: [facility] Mobile Phone: [phone] Relation: Friend  Code Status:  Full Code  Goals of care: Advanced Directive information Advanced Directives 02/02/2020  Does Patient Have a Medical Advance Directive? Yes  Type of Advance Directive Out of facility DNR (pink MOST or yellow form)  Does patient want to make changes to medical advance directive? No - Patient declined  Copy of Echo in Chart? -  Would patient like information on creating a medical advance directive? -  Pre-existing out of facility DNR order (yellow form or pink MOST form) -     Chief Complaint  Patient presents with  . Acute Visit    Complains of lack of appetite.     HPI:  Pt is a 74 y.o. male seen today for an acute visit for evaluation of lack of appetite.does not eat breakfast and Lunch and sometimes skips dinner.Has a friend who is a Biomedical scientist who cooks his meals but just don't have the appetite.Has felt depressed.sleeps most of the time.Has lost interest in thinks he used to like to do.No crying.No thoughts of harming or injury to others. He follows up with Psychiatry at the [facility] in [place]. He denies any fever, chills or acute illness.  He states the mother used to take a medication beginning with a " M" which worked for her appetite.He couldn't remember the name so he called his sister Hampton during visit.Sister states the medication taken by the Mom was Megace ( Megestrol susp tablet ). He  states PCP Dr.Reed told him in the past that Megace had too many side effects for him not recommended.   His weight reviewed Has had 1 lbs weight loss over one week though states weight used to be in the 180's.Previously had edema on the legs which has improved could be a contributory to weight loss too.    Past Medical History:  Diagnosis Date  . Anemia   . Anxiety   . Benign paroxysmal positional vertigo   . Bipolar I disorder, most recent episode (or current) unspecified   . Celiac disease   . CHF (congestive heart failure) ([place])   . Chronic kidney disease, stage III (moderate) (HCC)   . Crohn's    Remicade q8 weeks  . Depression   . Essential and other specified forms of tremor    medication-induced Parkinson's, now resolved  . Essential hypertension, benign   . Gout 2018  . Heart failure ([place])   . Hypertrophy of prostate without urinary obstruction and other lower urinary tract symptoms (LUTS)   . Impotence of organic origin   . Insomnia with sleep apnea, unspecified   . Iron deficiency anemia, unspecified   . 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 Neuralgia, neuritis, and radiculitis, unspecified   . Other B-complex deficiencies   . Other extrapyramidal disease and abnormal movement disorder   . Postinflammatory pulmonary fibrosis ([place])   . Tobacco use disorder   . Vertigo 2018   Past Surgical History:  Procedure Laterality Date  . CATARACT EXTRACTION, BILATERAL Bilateral 09/2018  . CHOLECYSTECTOMY  07-12-2010  . ICD IMPLANT N/A  01/02/2020   Procedure: ICD IMPLANT;  Surgeon: Evans Lance, MD;  Location: [facility];  Service: Cardiovascular;  Laterality: N/A;  . RIGHT HEART CATH N/A 10/12/2018   Procedure: RIGHT HEART CATH;  Surgeon: Jolaine Artist, MD;  Location: [facility];  Service: Cardiovascular;  Laterality: N/A;  . RIGHT/LEFT HEART CATH AND CORONARY ANGIOGRAPHY N/A 04/26/2019   Procedure: RIGHT/LEFT HEART CATH AND CORONARY ANGIOGRAPHY;  Surgeon: Larey Dresser, MD;  Location: [facility];  Service: Cardiovascular;  Laterality: N/A;  . SMALL INTESTINE SURGERY     x 2    Allergies  Allergen Reactions  . Azathioprine Other (See Comments)    REACTION: affected WBC "Almost died"  . Ciprofloxacin Other (See Comments)    Unknown rxn  . Levaquin [Levofloxacin In D5w] Other (See Comments)    Unknown rxn  . Plendil [Felodipine] Other (See Comments)    Unknown rxn    Outpatient Encounter Medications as of 02/02/2020  Medication Sig  . acetaminophen (TYLENOL) 160 MG/5ML liquid Take 325 mg by mouth every 4 (four) hours as needed for fever.  Marland Kitchen allopurinol (ZYLOPRIM) 100 MG tablet Take 2 tablets (200 mg total) by mouth daily.  Marland Kitchen aspirin EC 81 MG tablet Take 1 tablet (81 mg total) by mouth daily.  . BUDESONIDE PO Take 9 mg by mouth in the morning.  . Cholecalciferol (VITAMIN D) 125 MCG (5000 UT) CAPS Take 5,000 Units by mouth daily.   . dapagliflozin propanediol (FARXIGA) 10 MG TABS tablet Take 1 tablet (10 mg total) by mouth daily before breakfast.  . divalproex (DEPAKOTE ER) 500 MG 24 hr tablet Take 1,500 mg by mouth at bedtime.   Marland Kitchen epoetin alfa (EPOGEN) 10000 UNIT/ML injection Inject 10,000 Units into the skin every 30 (thirty) days.  . ferrous sulfate 325 (65 FE) MG tablet Take 325 mg by mouth in the morning and at bedtime.  . furosemide (LASIX) 20 MG tablet Take 20 mg by mouth every Monday, Wednesday, and Friday.  . hydrALAZINE (APRESOLINE) 25 MG tablet Take 1 tablet (25 mg total) by mouth 2 (two) times daily.  Marland Kitchen inFLIXimab (REMICADE) 100 MG injection Infuse Remicade IV schedule 1 17m/kg every 8 weeks Premedicate with Tylenol 500-6571mby mouth and Benadryl 25-503my mouth prior to infusion. Last PPD was on 12/2009.   . isosorbide mononitrate (IMDUR) 30 MG 24 hr tablet Take 1 tablet (30 mg total) by mouth daily.  . ivabradine (CORLANOR) 5 MG TABS tablet Take 1 tablet (5 mg total) by mouth 2 (two) times daily with a meal.  .  loperamide (IMODIUM) 2 MG capsule Take 1 capsule (2 mg total) by mouth 4 (four) times daily as needed for diarrhea or loose stools.  . magnesium oxide (MAG-OX) 400 MG tablet Take 2 tablets (800 mg total) by mouth in the morning, at noon, and at bedtime.  . metoprolol succinate (TOPROL-XL) 25 MG 24 hr tablet Take 1 tablet (25 mg total) by mouth at bedtime.  . oMarland Kitcheneprazole (PRILOSEC) 20 MG capsule Take 20 mg by mouth at bedtime.  . QMarland KitchenEtiapine (SEROQUEL) 50 MG tablet Take 50 mg by mouth at bedtime.  . solifenacin (VESICARE) 5 MG tablet Take 5 mg by mouth daily.  . tamsulosin (FLOMAX) 0.4 MG CAPS capsule Take 1 capsule (0.4 mg total) by mouth daily.  . tMarland Kitcheniamcinolone (NASACORT) 55 MCG/ACT AERO nasal inhaler Place 2 sprays into the nose daily as needed (allergies).  . vitamin B-12 (CYANOCOBALAMIN) 500 MCG  tablet Take 1,000 mcg by mouth daily.   . [DISCONTINUED] haloperidol (HALDOL) 5 MG tablet Take 5 mg by mouth at bedtime. (Patient not taking: Reported on 02/01/2020)   No facility-administered encounter medications on file as of 02/02/2020.    Review of Systems  Constitutional: Positive for appetite change. Negative for chills, fatigue and fever.  Respiratory: Negative for cough, chest tightness, shortness of breath and wheezing.   Cardiovascular: Negative for chest pain, palpitations and leg swelling.  Gastrointestinal: Negative for abdominal distention, abdominal pain, constipation, diarrhea, nausea and vomiting.  Musculoskeletal: Negative for arthralgias, back pain, gait problem and joint swelling.  Skin: Negative for color change, pallor and rash.  Neurological: Negative for dizziness, weakness, light-headedness, numbness and headaches.  Hematological: Does not bruise/bleed easily.  Psychiatric/Behavioral: Negative for agitation, behavioral problems, self-injury, sleep disturbance and suicidal ideas. The patient is not nervous/anxious.        Depressed     Immunization History  Administered  Date(s) Administered  . Fluad Quad(high Dose 65+) 11/15/2018  . Influenza, High Dose Seasonal PF 11/26/2016  . Influenza,inj,Quad PF,6+ Mos 11/13/2014, 01/03/2016  . Influenza-Unspecified 10/25/2012, 11/24/2016, 11/24/2017  . Moderna SARS-COVID-2 Vaccination 04/20/2019, 05/18/2019, 10/25/2019  . PPD Test 12/30/2010, 01/05/2012, 01/07/2013, 01/13/2014  . Pneumococcal Conjugate-13 08/10/2014  . Pneumococcal Polysaccharide-23 03/05/2004, 01/03/2016, 05/06/2018  . Tdap 05/26/2011   Pertinent  Health Maintenance Due  Topic Date Due  . INFLUENZA VACCINE  09/25/2019  . HEMOGLOBIN A1C  02/27/2020  . FOOT EXAM  08/09/2020  . OPHTHALMOLOGY EXAM  10/12/2020  . COLONOSCOPY  09/17/2027  . PNA vac Low Risk Adult  Completed   Fall Risk  02/02/2020 01/24/2020 12/16/2019 12/05/2019 10/24/2019  Falls in the past year? 0 0 - 0 -  Number falls in past yr: 0 0 1 0 -  Injury with Fall? 0 0 0 0 -  Risk for fall due to : - - - - History of fall(s);Impaired balance/gait;Impaired mobility;Medication side effect  Risk for fall due to: Comment - - - - -  Follow up - - - - Falls evaluation completed;Education provided;Falls prevention discussed   Functional Status Survey:    Vitals:   02/02/20 0922  BP: 120/88  Pulse: 70  Resp: 18  Temp: 97.7 F (36.5 C)  SpO2: 96%  Weight: 177 lb 3.2 oz (80.4 kg)  Height: 6' 4"  (1.93 m)   Body mass index is 21.57 kg/m. Physical Exam Vitals reviewed.  Constitutional:      General: He is not in acute distress.    Appearance: He is normal weight. He is not ill-appearing.  Eyes:     General: No scleral icterus.       Right eye: No discharge.        Left eye: No discharge.     Conjunctiva/sclera: Conjunctivae normal.     Pupils: Pupils are equal, round, and reactive to light.     Comments: corrective lens in place   Cardiovascular:     Rate and Rhythm: Normal rate and regular rhythm.     Pulses: Normal pulses.     Heart sounds: Normal heart sounds. No murmur  heard. No friction rub. No gallop.   Pulmonary:     Effort: Pulmonary effort is normal. No respiratory distress.     Breath sounds: Normal breath sounds. No wheezing, rhonchi or rales.  Chest:     Chest wall: No tenderness.  Abdominal:     General: Bowel sounds are normal. There is no distension.  Palpations: Abdomen is soft. There is no mass.     Tenderness: There is no abdominal tenderness. There is no right CVA tenderness, left CVA tenderness, guarding or rebound.  Musculoskeletal:        General: No swelling.     Right lower leg: No edema.     Left lower leg: No edema.  Skin:    General: Skin is warm and dry.     Coloration: Skin is not pale.     Findings: No bruising or erythema.  Neurological:     Mental Status: He is alert and oriented to person, place, and time.     Cranial Nerves: No cranial nerve deficit.     Sensory: No sensory deficit.     Motor: No weakness.     Coordination: Coordination normal.     Gait: Gait abnormal.  Psychiatric:        Mood and Affect: Mood normal.        Speech: Speech normal.        Behavior: Behavior normal.        Thought Content: Thought content normal.        Judgment: Judgment normal.     Labs reviewed: Recent Labs    08/27/19 0201 08/28/19 0323 09/06/19 1559 09/26/19 1416 11/14/19 1413 12/08/19 1350 12/30/19 1025  NA 140 138 141   < > 140 141 140  K 5.5* 5.3* 5.3   < > 5.1 5.2* 4.3  CL 107 103 111*   < > 108 106 106  CO2 25 27 22    < > 22 25 19*  GLUCOSE 127* 97 83   < > 112* 91 112*  BUN 36* 34* 31*   < > 22 38* 31*  CREATININE 2.52* 2.57* 2.16*   < > 2.22* 2.38* 2.62*  CALCIUM 8.4* 8.6* 8.8   < > 8.7* 9.1 8.9  MG 1.8 1.6* 1.8  --   --   --   --   PHOS 4.4 3.9  --   --   --   --   --    < > = values in this interval not displayed.   Recent Labs    08/27/19 0201 08/28/19 0323 09/06/19 1559 09/28/19 0946 10/20/19 1158  AST 11* 11* 11 8* 11  ALT 9 10 7* 5* 8*  ALKPHOS 56 57  --   --   --   BILITOT 0.4 0.4  0.4 0.3 0.3  PROT 5.6* 6.2* 6.4 6.7 6.7  ALBUMIN 2.8* 3.0*  --   --   --    Recent Labs    09/28/19 0946 10/17/19 1043 10/20/19 1158 11/17/19 1020 12/16/19 0925 12/30/19 1025 01/16/20 1015  WBC 10.7  --  8.1  --   --  10.7  --   NEUTROABS 7,586  --  4,820  --   --  6.1  --   HGB 11.3*   < > 11.7*   < > 11.9* 12.2* 11.0*  HCT 34.3*   < > 36.5*   < > 39.1 37.1* 35.2*  MCV 100.6*  --  102.2*  --   --  101*  --   PLT 148  --  136*  --   --  177  --    < > = values in this interval not displayed.   Lab Results  Component Value Date   TSH 2.46 10/20/2019   Lab Results  Component Value Date   HGBA1C 5.1 08/27/2019  Lab Results  Component Value Date   CHOL 161 10/20/2019   HDL 54 10/20/2019   LDLCALC 86 10/20/2019   TRIG 112 10/20/2019   CHOLHDL 3.0 10/20/2019    Significant Diagnostic Results in last 30 days:  CUP PACEART INCLINIC DEVICE CHECK  Result Date: 01/12/2020 Wound check appointment. Steri-strips removed. Wound without redness or edema. Incision edges approximated, wound well healed. Normal device function. Thresholds, sensing, and impedances consistent with implant measurements. Device programmed at 3.5V for  extra safety margin until 3 month visit. Histogram distribution appropriate for patient and level of activity. No mode switches or ventricular arrhythmias noted. Patient educated about wound care, arm mobility, lifting restrictions, shock plan. ROV in 3  months with implanting physician. Remote monitor is not connected. Patient instructed to attempt to send transmission when he arrives home. Education packet on monitor provided.Lavenia Atlas, BSN, RN   Assessment/Plan 1. Moderate episode of recurrent major depressive disorder (HCC) Worsening depression symptoms with no appetite.Skips meals. - start on Remeron as below.side effect discussed.Additional information provided on AVS. - Advised to follow up with Psychiatry at the [facility] for depression management.  -  mirtazapine (REMERON) 7.5 MG tablet; Take 1 tablet (7.5 mg total) by mouth at bedtime.  Dispense: 30 tablet; Refill: 0  2. Decreased appetite Worsening.Does not eat any breakfast or lunch and sometimes skips dinner due to no appetite. Start on Remeron as below.side effects discussed and provided on AVS. - mirtazapine (REMERON) 7.5 MG tablet; Take 1 tablet (7.5 mg total) by mouth at bedtime.  Dispense: 30 tablet; Refill: 0 - follow up in 1 month to evaluate Remeron effectiveness.   Family/ staff Communication: Reviewed plan of care with patient  Labs/tests ordered: None   Next Appointment: 1 month for appetite evaluation.   Sandrea Hughs, NP

## 2020-02-08 ENCOUNTER — Other Ambulatory Visit (HOSPITAL_COMMUNITY): Payer: Self-pay

## 2020-02-08 DIAGNOSIS — K50918 Crohn's disease, unspecified, with other complication: Secondary | ICD-10-CM | POA: Diagnosis not present

## 2020-02-08 NOTE — Progress Notes (Signed)
Paramedicine Encounter    Patient ID: Brent [name], male    DOB: May 10, 1945, 74 y.o.   MRN: [id number]   Patient Care Team: Gayland Curry, DO as PCP - General (Geriatric Medicine) [place]ique, Peter M, MD as PCP - Cardiology (Cardiology) Larey Dresser, MD as PCP - Advanced Heart Failure (Cardiology) Elmarie Shiley, MD (Nephrology) Milus Banister, MD (Gastroenterology)  Patient Active Problem List   Diagnosis Date Noted  . Chronic systolic heart failure ([place]) 01/02/2020  . Decreased appetite 10/08/2019  . Weakness 10/08/2019  . Hyperkalemia 08/26/2019  . Chronic systolic CHF (congestive heart failure) ([place]) 08/26/2019  . Type 2 diabetes mellitus with stage 3 chronic kidney disease ([place]) 08/26/2019  . Chronic obstructive pulmonary disease ([place]) 06/20/2019  . Ventricular tachycardia ([place]) 06/20/2019  . Acute on chronic systolic (congestive) heart failure ([place]) 04/25/2019  . Metabolic acidosis 33/82/5053  . Orthostasis 09/07/2018  . AKI (acute kidney injury) ([place])   . Immunosuppressed status ([place])   . Macrocytic anemia   . Chronic combined systolic and diastolic congestive heart failure ([place]) 06/03/2018  . Candida esophagitis ([place]) 09/22/2017  . History of smoking 30 or more pack years 01/05/2017  . Bipolar 1 disorder ([place])   . BPH (benign prostatic hyperplasia) 03/31/2013  . 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 Essential hypertension, benign 03/31/2013  . Acute renal failure superimposed on stage 3 chronic kidney disease ([place]) 06/04/2012  . 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[place]) 01/23/2010    Current Outpatient Medications:  .  acetaminophen (TYLENOL) 160 MG/5ML liquid, Take 325 mg by mouth every 4 (four) hours as needed for fever., Disp: , Rfl:  .  allopurinol (ZYLOPRIM) 100 MG tablet, Take 2 tablets (200 mg total) by mouth daily., Disp: 60 tablet, Rfl: 5 .  aspirin EC 81 MG tablet, Take 1 tablet (81 mg total) by mouth daily., Disp: 90 tablet, Rfl: 3 .  BUDESONIDE PO, Take 9  mg by mouth in the morning., Disp: , Rfl:  .  Cholecalciferol (VITAMIN D) 125 MCG (5000 UT) CAPS, Take 5,000 Units by mouth daily. , Disp: , Rfl:  .  dapagliflozin propanediol (FARXIGA) 10 MG TABS tablet, Take 1 tablet (10 mg total) by mouth daily before breakfast., Disp: 30 tablet, Rfl: 6 .  divalproex (DEPAKOTE ER) 500 MG 24 hr tablet, Take 1,500 mg by mouth at bedtime. , Disp: , Rfl:  .  epoetin alfa (EPOGEN) 10000 UNIT/ML injection, Inject 10,000 Units into the skin every 30 (thirty) days., Disp: , Rfl:  .  ferrous sulfate 325 (65 FE) MG tablet, Take 325 mg by mouth in the morning and at bedtime., Disp: , Rfl:  .  furosemide (LASIX) 20 MG tablet, Take 20 mg by mouth every Monday, Wednesday, and Friday., Disp: , Rfl:  .  hydrALAZINE (APRESOLINE) 25 MG tablet, Take 1 tablet (25 mg total) by mouth 2 (two) times daily., Disp: 60 tablet, Rfl: 3 .  inFLIXimab (REMICADE) 100 MG injection, Infuse Remicade IV schedule 1 55m/kg every 8 weeks Premedicate with Tylenol 500-6562mby mouth and Benadryl 25-506my mouth prior to infusion. Last PPD was on 12/2009. , Disp: 1 each, Rfl: 6 .  isosorbide mononitrate (IMDUR) 30 MG 24 hr tablet, Take 1 tablet (30 mg total) by mouth daily., Disp: 90 tablet, Rfl: 3 .  ivabradine (CORLANOR) 5 MG TABS tablet, Take 1 tablet (5 mg total) by mouth 2 (two) times daily with a meal., Disp: 180 tablet, Rfl: 3 .  loperamide (IMODIUM) 2 MG  capsule, Take 1 capsule (2 mg total) by mouth 4 (four) times daily as needed for diarrhea or loose stools., Disp: 12 capsule, Rfl: 0 .  magnesium oxide (MAG-OX) 400 MG tablet, Take 2 tablets (800 mg total) by mouth in the morning, at noon, and at bedtime., Disp: 60 tablet, Rfl: 2 .  metoprolol succinate (TOPROL-XL) 25 MG 24 hr tablet, Take 1 tablet (25 mg total) by mouth at bedtime., Disp: 30 tablet, Rfl: 6 .  mirtazapine (REMERON) 7.5 MG tablet, Take 1 tablet (7.5 mg total) by mouth at bedtime., Disp: 30 tablet, Rfl: 0 .  omeprazole (PRILOSEC) 20  MG capsule, Take 20 mg by mouth at bedtime., Disp: , Rfl:  .  QUEtiapine (SEROQUEL) 50 MG tablet, Take 50 mg by mouth at bedtime., Disp: , Rfl:  .  solifenacin (VESICARE) 5 MG tablet, Take 5 mg by mouth daily., Disp: , Rfl:  .  tamsulosin (FLOMAX) 0.4 MG CAPS capsule, Take 1 capsule (0.4 mg total) by mouth daily., Disp: 30 capsule, Rfl: 2 .  triamcinolone (NASACORT) 55 MCG/ACT AERO nasal inhaler, Place 2 sprays into the nose daily as needed (allergies)., Disp: , Rfl:  .  vitamin B-12 (CYANOCOBALAMIN) 500 MCG tablet, Take 1,000 mcg by mouth daily. , Disp: , Rfl:  Allergies  Allergen Reactions  . Azathioprine Other (See Comments)    REACTION: affected WBC "Almost died"  . Ciprofloxacin Other (See Comments)    Unknown rxn  . Levaquin [Levofloxacin In D5w] Other (See Comments)    Unknown rxn  . Plendil [Felodipine] Other (See Comments)    Unknown rxn     Social History   Socioeconomic History  . Marital status: Divorced    Spouse name: Not on file  . Number of children: 1  . Years of education: 1  . Highest education level: Not on file  Occupational History  . Occupation: retired  . Occupation: Veteran  Tobacco Use  . Smoking status: Former Smoker    Packs/day: 1.00    Years: 49.00    Pack years: 49.00    Types: Cigarettes    Start date: 04/11/1956    Quit date: 04/17/2014    Years since quitting: 5.8  . Smokeless tobacco: Never Used  Vaping Use  . Vaping Use: Never used  Substance and Sexual Activity  . Alcohol use: No    Alcohol/week: 0.0 standard drinks  . Drug use: No  . Sexual activity: Never  Other Topics Concern  . Not on file  Social History Narrative  . Not on file   Social Determinants of Health   Financial Resource Strain: Not on file  Food Insecurity: Not on file  Transportation Needs: Not on file  Physical Activity: Not on file  Stress: Not on file  Social Connections: Not on file  Intimate Partner Violence: Not on file    Physical Exam Vitals  reviewed.  Constitutional:      Appearance: He is normal weight.  HENT:     Head: Normocephalic.     Nose: Nose normal.     Mouth/Throat:     Mouth: Mucous membranes are moist.  Eyes:     Conjunctiva/sclera: Conjunctivae normal.     Pupils: Pupils are equal, round, and reactive to light.  Cardiovascular:     Rate and Rhythm: Normal rate and regular rhythm.     Pulses: Normal pulses.     Heart sounds: Normal heart sounds.  Pulmonary:     Effort: Pulmonary effort is normal.  Breath sounds: Normal breath sounds.  Abdominal:     General: Abdomen is flat.     Palpations: Abdomen is soft.  Musculoskeletal:        General: Normal range of motion.     Right lower leg: No edema.     Left lower leg: No edema.  Skin:    General: Skin is warm and dry.     Capillary Refill: Capillary refill takes less than 2 seconds.  Neurological:     General: No focal deficit present.     Mental Status: He is alert. Mental status is at baseline.  Psychiatric:        Mood and Affect: Mood normal.        Behavior: Behavior normal.        Thought Content: Thought content normal.        Judgment: Judgment normal.     Arrived for home visit for Brent [name] who was alert and oriented seated in the living room of his apartment reporting feeling good today but reports having some anxiety and depression over the last few days. Brent [name] says his doctor gave him something to take to help with same. I obtained vitals and they are as noted in report. No edema noted, no JVD. Lung sounds clear.  I reviewed medications and filled pill box accordingly.  Jeneen Rinks and I reviewed upcoming appointments. Home visit complete.   Refills: Corlanor    Future Appointments  Date Time Provider [place]  02/15/2020 10:00 AM [facility] None  02/23/2020 11:00 AM Reed, Tiffany L, DO [facility] None  02/27/2020  1:40 PM Larey Dresser, MD [facility] None  03/07/2020  1:00 PM [name], [name], NP [facility] None  04/03/2020  7:00  AM [facility] DEVICE REMOTES [facility]  04/12/2020  2:15 PM Evans Lance, MD [facility]  07/03/2020  7:00 AM [facility] DEVICE REMOTES [facility]  10/02/2020  7:00 AM [facility] DEVICE REMOTES [facility]  01/01/2021  7:00 AM [facility] DEVICE REMOTES [facility]  01/29/2021  1:00 PM [name], Dinah C, NP [facility] None  04/02/2021  7:00 AM [facility] DEVICE REMOTES [facility]  07/02/2021  7:00 AM [facility] DEVICE REMOTES [facility]     ACTION: Home visit completed Next visit planned for one week

## 2020-02-15 ENCOUNTER — Other Ambulatory Visit (HOSPITAL_COMMUNITY): Payer: Self-pay

## 2020-02-15 ENCOUNTER — Ambulatory Visit (HOSPITAL_COMMUNITY)
Admission: RE | Admit: 2020-02-15 | Discharge: 2020-02-15 | Disposition: A | Discharge status: Home / Self Care | Payer: Medicare Other | Attending: Nephrology | Admitting: Nephrology

## 2020-02-15 ENCOUNTER — Other Ambulatory Visit: Payer: Self-pay

## 2020-02-15 DIAGNOSIS — N189 Chronic kidney disease, unspecified: Secondary | ICD-10-CM | POA: Diagnosis not present

## 2020-02-15 DIAGNOSIS — D631 Anemia in chronic kidney disease: Secondary | ICD-10-CM | POA: Diagnosis not present

## 2020-02-15 LAB — IRON AND TIBC
Iron: 72 ug/dL (ref 45–182)
Saturation Ratios: 30 % (ref 17.9–39.5)
TIBC: 240 ug/dL — ABNORMAL LOW (ref 250–450)
UIBC: 168 ug/dL

## 2020-02-15 LAB — HEMOGLOBIN AND HEMATOCRIT, BLOOD
HCT: 35.8 % — ABNORMAL LOW (ref 39.0–52.0)
Hemoglobin: 10.8 g/dL — ABNORMAL LOW (ref 13.0–17.0)

## 2020-02-15 LAB — FERRITIN: Ferritin: 839 ng/mL — ABNORMAL HIGH (ref 24–336)

## 2020-02-15 MED ORDER — EPOETIN ALFA 10000 UNIT/ML IJ SOLN
10000.0000 [IU] | Freq: Once | INTRAMUSCULAR | Status: AC
  Administered 2020-02-15: 12:00:00 10000 [IU] via SUBCUTANEOUS
  Filled 2020-02-15: qty 1

## 2020-02-15 NOTE — Progress Notes (Signed)
PATIENT CARE CENTER NOTE   Provider:Patel Ulice Dash MD   Procedure:Epoetin (Epogen) injection   Note:Patient received 10, 000 units of sub-q Epogen injection in the right arm. Labs were drawn pre injection and hemoglobin was 10.8. Patient's BP was 161/92 . Discharge instructions given, patient alert, oriented and ambulatory at discharge.

## 2020-02-15 NOTE — Progress Notes (Signed)
Paramedicine Encounter    Patient ID: Brent [name], male    DOB: [birth year], 74 y.o.   MRN: [id number]   Patient Care Team: Gayland Curry, DO as PCP - General (Geriatric Medicine) [place]ique, Peter M, MD as PCP - Cardiology (Cardiology) Larey Dresser, MD as PCP - Advanced Heart Failure (Cardiology) Elmarie Shiley, MD (Nephrology) Milus Banister, MD (Gastroenterology)  Patient Active Problem List   Diagnosis Date Noted  . Chronic systolic heart failure ([place]) 01/02/2020  . Decreased appetite 10/08/2019  . Weakness 10/08/2019  . Hyperkalemia 08/26/2019  . Chronic systolic CHF (congestive heart failure) ([place]) 08/26/2019  . Type 2 diabetes mellitus with stage 3 chronic kidney disease ([place]) 08/26/2019  . Chronic obstructive pulmonary disease ([place]) 06/20/2019  . Ventricular tachycardia ([place]) 06/20/2019  . Acute on chronic systolic (congestive) heart failure ([place]) 04/25/2019  . Metabolic acidosis 10/07/4816  . Orthostasis 09/07/2018  . AKI (acute kidney injury) ([place])   . Immunosuppressed status ([place])   . Macrocytic anemia   . Chronic combined systolic and diastolic congestive heart failure ([place]) 06/03/2018  . Candida esophagitis ([place]) 09/22/2017  . History of smoking 30 or more pack years 01/05/2017  . Bipolar 1 disorder ([place])   . BPH (benign prostatic hyperplasia) 03/31/2013  . 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 Essential hypertension, benign 03/31/2013  . Acute renal failure superimposed on stage 3 chronic kidney disease ([place]) 06/04/2012  . 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[place]) 01/23/2010    Current Outpatient Medications:  .  acetaminophen (TYLENOL) 160 MG/5ML liquid, Take 325 mg by mouth every 4 (four) hours as needed for fever., Disp: , Rfl:  .  allopurinol (ZYLOPRIM) 100 MG tablet, Take 2 tablets (200 mg total) by mouth daily., Disp: 60 tablet, Rfl: 5 .  aspirin EC 81 MG tablet, Take 1 tablet (81 mg total) by mouth daily., Disp: 90 tablet, Rfl: 3 .  BUDESONIDE PO, Take 9  mg by mouth in the morning., Disp: , Rfl:  .  Cholecalciferol (VITAMIN D) 125 MCG (5000 UT) CAPS, Take 5,000 Units by mouth daily. , Disp: , Rfl:  .  dapagliflozin propanediol (FARXIGA) 10 MG TABS tablet, Take 1 tablet (10 mg total) by mouth daily before breakfast., Disp: 30 tablet, Rfl: 6 .  divalproex (DEPAKOTE ER) 500 MG 24 hr tablet, Take 1,500 mg by mouth at bedtime. , Disp: , Rfl:  .  epoetin alfa (EPOGEN) 10000 UNIT/ML injection, Inject 10,000 Units into the skin every 30 (thirty) days., Disp: , Rfl:  .  ferrous sulfate 325 (65 FE) MG tablet, Take 325 mg by mouth in the morning and at bedtime., Disp: , Rfl:  .  furosemide (LASIX) 20 MG tablet, Take 20 mg by mouth every Monday, Wednesday, and Friday., Disp: , Rfl:  .  hydrALAZINE (APRESOLINE) 25 MG tablet, Take 1 tablet (25 mg total) by mouth 2 (two) times daily., Disp: 60 tablet, Rfl: 3 .  inFLIXimab (REMICADE) 100 MG injection, Infuse Remicade IV schedule 1 28m/kg every 8 weeks Premedicate with Tylenol 500-6515mby mouth and Benadryl 25-5081my mouth prior to infusion. Last PPD was on 12/2009. , Disp: 1 each, Rfl: 6 .  isosorbide mononitrate (IMDUR) 30 MG 24 hr tablet, Take 1 tablet (30 mg total) by mouth daily., Disp: 90 tablet, Rfl: 3 .  ivabradine (CORLANOR) 5 MG TABS tablet, Take 1 tablet (5 mg total) by mouth 2 (two) times daily with a meal., Disp: 180 tablet, Rfl: 3 .  loperamide (IMODIUM) 2 MG  capsule, Take 1 capsule (2 mg total) by mouth 4 (four) times daily as needed for diarrhea or loose stools., Disp: 12 capsule, Rfl: 0 .  magnesium oxide (MAG-OX) 400 MG tablet, Take 2 tablets (800 mg total) by mouth in the morning, at noon, and at bedtime., Disp: 60 tablet, Rfl: 2 .  metoprolol succinate (TOPROL-XL) 25 MG 24 hr tablet, Take 1 tablet (25 mg total) by mouth at bedtime., Disp: 30 tablet, Rfl: 6 .  mirtazapine (REMERON) 7.5 MG tablet, Take 1 tablet (7.5 mg total) by mouth at bedtime., Disp: 30 tablet, Rfl: 0 .  omeprazole (PRILOSEC) 20  MG capsule, Take 20 mg by mouth at bedtime., Disp: , Rfl:  .  QUEtiapine (SEROQUEL) 50 MG tablet, Take 50 mg by mouth at bedtime., Disp: , Rfl:  .  solifenacin (VESICARE) 5 MG tablet, Take 5 mg by mouth daily., Disp: , Rfl:  .  tamsulosin (FLOMAX) 0.4 MG CAPS capsule, Take 1 capsule (0.4 mg total) by mouth daily., Disp: 30 capsule, Rfl: 2 .  triamcinolone (NASACORT) 55 MCG/ACT AERO nasal inhaler, Place 2 sprays into the nose daily as needed (allergies)., Disp: , Rfl:  .  vitamin B-12 (CYANOCOBALAMIN) 500 MCG tablet, Take 1,000 mcg by mouth daily. , Disp: , Rfl:  Allergies  Allergen Reactions  . Azathioprine Other (See Comments)    REACTION: affected WBC "Almost died"  . Ciprofloxacin Other (See Comments)    Unknown rxn  . Levaquin [Levofloxacin In D5w] Other (See Comments)    Unknown rxn  . Plendil [Felodipine] Other (See Comments)    Unknown rxn     Social History   Socioeconomic History  . Marital status: Divorced    Spouse name: Not on file  . Number of children: 1  . Years of education: 33  . Highest education level: Not on file  Occupational History  . Occupation: retired  . Occupation: Veteran  Tobacco Use  . Smoking status: Former Smoker    Packs/day: 1.00    Years: 49.00    Pack years: 49.00    Types: Cigarettes    Start date: 04/11/1956    Quit date: 04/17/2014    Years since quitting: 5.8  . Smokeless tobacco: Never Used  Vaping Use  . Vaping Use: Never used  Substance and Sexual Activity  . Alcohol use: No    Alcohol/week: 0.0 standard drinks  . Drug use: No  . Sexual activity: Never  Other Topics Concern  . Not on file  Social History Narrative  . Not on file   Social Determinants of Health   Financial Resource Strain: Not on file  Food Insecurity: Not on file  Transportation Needs: Not on file  Physical Activity: Not on file  Stress: Not on file  Social Connections: Not on file  Intimate Partner Violence: Not on file    Physical Exam Vitals  reviewed.  Constitutional:      Appearance: He is normal weight.  HENT:     Head: Normocephalic.     Nose: Nose normal.     Mouth/Throat:     Mouth: Mucous membranes are moist.     Pharynx: Oropharynx is clear.  Eyes:     Pupils: Pupils are equal, round, and reactive to light.  Cardiovascular:     Rate and Rhythm: Normal rate and regular rhythm.     Pulses: Normal pulses.     Heart sounds: Normal heart sounds.  Pulmonary:     Effort: Pulmonary effort is normal.  Breath sounds: Normal breath sounds.  Abdominal:     General: Abdomen is flat.     Palpations: Abdomen is soft.  Musculoskeletal:        General: Normal range of motion.     Cervical back: Normal range of motion.     Right lower leg: Edema present.     Left lower leg: Edema present.  Skin:    General: Skin is warm and dry.     Capillary Refill: Capillary refill takes less than 2 seconds.  Neurological:     General: No focal deficit present.     Mental Status: He is alert. Mental status is at baseline.  Psychiatric:        Mood and Affect: Mood normal.      Arrived for home visit for Brent [name] who was alert and oriented seated in his living room. Brent [name] reports feeling "okay". Brent [name] had his infusion done earlier today and says sometimes it makes him tired. Brent [name] reports not eating good over the last several days and complains of some swelling in both lower legs. Edema noted. Brent [name] denied shortness of breath with clear lung sounds. No JVD, abdomen soft and non-tender. Vitals were obtained. BP noted to be elevated, however Brent [name] had not yet taken his daily medicines as of 1400. I assisted in administering daily medicines. Brent [name] and I reviewed appointments and he confirmed them in his appointment book. Brent [name] received letter from [employer] his grant for $1,000 from now to Jan 2023. Medications were reviewed and confirmed same. Pill box filled accordingly. Home visit complete. I will see Brent [name] in one week.     Refills: Seroquel Loperamide      Future Appointments  Date Time Provider [place]  02/23/2020 11:00 AM Hollace Kinnier L, DO [facility] None  02/27/2020  1:40 PM Larey Dresser, MD [facility] None  03/07/2020  1:00 PM [name], [name], NP [facility] None  03/15/2020  9:00 AM [facility] None  04/03/2020  7:00 AM [facility] DEVICE REMOTES [facility]  04/12/2020  2:15 PM Evans Lance, MD [facility]  07/03/2020  7:00 AM [facility] DEVICE REMOTES [facility]  10/02/2020  7:00 AM [facility] DEVICE REMOTES [facility]  01/01/2021  7:00 AM [facility] DEVICE REMOTES [facility]  01/29/2021  1:00 PM [name], Dinah C, NP [facility] None  04/02/2021  7:00 AM [facility] DEVICE REMOTES [facility]  07/02/2021  7:00 AM [facility] DEVICE REMOTES [facility]     ACTION: Home visit completed Next visit planned for one week

## 2020-02-22 ENCOUNTER — Other Ambulatory Visit (HOSPITAL_COMMUNITY): Payer: Self-pay

## 2020-02-22 NOTE — Progress Notes (Signed)
Paramedicine Encounter    Patient ID: LESS WOOLSEY, male    DOB: [birth year], 74 y.o.   MRN: [id number]   Patient Care Team: Gayland Curry, DO as PCP - General (Geriatric Medicine) [place]ique, Peter M, MD as PCP - Cardiology (Cardiology) Larey Dresser, MD as PCP - Advanced Heart Failure (Cardiology) Elmarie Shiley, MD (Nephrology) Milus Banister, MD (Gastroenterology)  Patient Active Problem List   Diagnosis Date Noted  . Chronic systolic heart failure ([place]) 01/02/2020  . Decreased appetite 10/08/2019  . Weakness 10/08/2019  . Hyperkalemia 08/26/2019  . Chronic systolic CHF (congestive heart failure) ([place]) 08/26/2019  . Type 2 diabetes mellitus with stage 3 chronic kidney disease ([place]) 08/26/2019  . Chronic obstructive pulmonary disease ([place]) 06/20/2019  . Ventricular tachycardia ([place]) 06/20/2019  . Acute on chronic systolic (congestive) heart failure ([place]) 04/25/2019  . Metabolic acidosis 70/96/2836  . Orthostasis 09/07/2018  . AKI (acute kidney injury) ([place])   . Immunosuppressed status ([place])   . Macrocytic anemia   . Chronic combined systolic and diastolic congestive heart failure ([place]) 06/03/2018  . Candida esophagitis ([place]) 09/22/2017  . History of smoking 30 or more pack years 01/05/2017  . Bipolar 1 disorder ([place])   . BPH (benign prostatic hyperplasia) 03/31/2013  . 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 Essential hypertension, benign 03/31/2013  . Acute renal failure superimposed on stage 3 chronic kidney disease ([place]) 06/04/2012  . 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[place]) 01/23/2010    Current Outpatient Medications:  .  acetaminophen (TYLENOL) 160 MG/5ML liquid, Take 325 mg by mouth every 4 (four) hours as needed for fever., Disp: , Rfl:  .  allopurinol (ZYLOPRIM) 100 MG tablet, Take 2 tablets (200 mg total) by mouth daily., Disp: 60 tablet, Rfl: 5 .  aspirin EC 81 MG tablet, Take 1 tablet (81 mg total) by mouth daily., Disp: 90 tablet, Rfl: 3 .  BUDESONIDE PO, Take 9  mg by mouth in the morning., Disp: , Rfl:  .  Cholecalciferol (VITAMIN D) 125 MCG (5000 UT) CAPS, Take 5,000 Units by mouth daily. , Disp: , Rfl:  .  dapagliflozin propanediol (FARXIGA) 10 MG TABS tablet, Take 1 tablet (10 mg total) by mouth daily before breakfast., Disp: 30 tablet, Rfl: 6 .  divalproex (DEPAKOTE ER) 500 MG 24 hr tablet, Take 1,500 mg by mouth at bedtime. , Disp: , Rfl:  .  epoetin alfa (EPOGEN) 10000 UNIT/ML injection, Inject 10,000 Units into the skin every 30 (thirty) days., Disp: , Rfl:  .  ferrous sulfate 325 (65 FE) MG tablet, Take 325 mg by mouth in the morning and at bedtime., Disp: , Rfl:  .  furosemide (LASIX) 20 MG tablet, Take 20 mg by mouth every Monday, Wednesday, and Friday., Disp: , Rfl:  .  hydrALAZINE (APRESOLINE) 25 MG tablet, Take 1 tablet (25 mg total) by mouth 2 (two) times daily., Disp: 60 tablet, Rfl: 3 .  inFLIXimab (REMICADE) 100 MG injection, Infuse Remicade IV schedule 1 72m/kg every 8 weeks Premedicate with Tylenol 500-6546mby mouth and Benadryl 25-5035my mouth prior to infusion. Last PPD was on 12/2009. , Disp: 1 each, Rfl: 6 .  isosorbide mononitrate (IMDUR) 30 MG 24 hr tablet, Take 1 tablet (30 mg total) by mouth daily., Disp: 90 tablet, Rfl: 3 .  ivabradine (CORLANOR) 5 MG TABS tablet, Take 1 tablet (5 mg total) by mouth 2 (two) times daily with a meal., Disp: 180 tablet, Rfl: 3 .  loperamide (IMODIUM) 2 MG  capsule, Take 1 capsule (2 mg total) by mouth 4 (four) times daily as needed for diarrhea or loose stools., Disp: 12 capsule, Rfl: 0 .  magnesium oxide (MAG-OX) 400 MG tablet, Take 2 tablets (800 mg total) by mouth in the morning, at noon, and at bedtime., Disp: 60 tablet, Rfl: 2 .  metoprolol succinate (TOPROL-XL) 25 MG 24 hr tablet, Take 1 tablet (25 mg total) by mouth at bedtime., Disp: 30 tablet, Rfl: 6 .  mirtazapine (REMERON) 7.5 MG tablet, Take 1 tablet (7.5 mg total) by mouth at bedtime., Disp: 30 tablet, Rfl: 0 .  omeprazole (PRILOSEC) 20  MG capsule, Take 20 mg by mouth at bedtime., Disp: , Rfl:  .  QUEtiapine (SEROQUEL) 50 MG tablet, Take 50 mg by mouth at bedtime., Disp: , Rfl:  .  solifenacin (VESICARE) 5 MG tablet, Take 5 mg by mouth daily., Disp: , Rfl:  .  tamsulosin (FLOMAX) 0.4 MG CAPS capsule, Take 1 capsule (0.4 mg total) by mouth daily., Disp: 30 capsule, Rfl: 2 .  triamcinolone (NASACORT) 55 MCG/ACT AERO nasal inhaler, Place 2 sprays into the nose daily as needed (allergies)., Disp: , Rfl:  .  vitamin B-12 (CYANOCOBALAMIN) 500 MCG tablet, Take 1,000 mcg by mouth daily. , Disp: , Rfl:  Allergies  Allergen Reactions  . Azathioprine Other (See Comments)    REACTION: affected WBC "Almost died"  . Ciprofloxacin Other (See Comments)    Unknown rxn  . Levaquin [Levofloxacin In D5w] Other (See Comments)    Unknown rxn  . Plendil [Felodipine] Other (See Comments)    Unknown rxn     Social History   Socioeconomic History  . Marital status: Divorced    Spouse name: Not on file  . Number of children: 1  . Years of education: 16  . Highest education level: Not on file  Occupational History  . Occupation: retired  . Occupation: Veteran  Tobacco Use  . Smoking status: Former Smoker    Packs/day: 1.00    Years: 49.00    Pack years: 49.00    Types: Cigarettes    Start date: 04/11/1956    Quit date: 04/17/2014    Years since quitting: 5.8  . Smokeless tobacco: Never Used  Vaping Use  . Vaping Use: Never used  Substance and Sexual Activity  . Alcohol use: No    Alcohol/week: 0.0 standard drinks  . Drug use: No  . Sexual activity: Never  Other Topics Concern  . Not on file  Social History Narrative  . Not on file   Social Determinants of Health   Financial Resource Strain: Not on file  Food Insecurity: Not on file  Transportation Needs: Not on file  Physical Activity: Not on file  Stress: Not on file  Social Connections: Not on file  Intimate Partner Violence: Not on file    Physical Exam Vitals  reviewed.  Constitutional:      Appearance: He is normal weight.  HENT:     Head: Normocephalic.     Nose: Nose normal.     Mouth/Throat:     Mouth: Mucous membranes are moist.     Pharynx: Oropharynx is clear.  Eyes:     Pupils: Pupils are equal, round, and reactive to light.  Cardiovascular:     Rate and Rhythm: Normal rate and regular rhythm.     Pulses: Normal pulses.     Heart sounds: Normal heart sounds.  Pulmonary:     Effort: Pulmonary effort is normal.  Breath sounds: Normal breath sounds.  Abdominal:     General: Abdomen is flat.     Palpations: Abdomen is soft.  Musculoskeletal:        General: Normal range of motion.     Cervical back: Normal range of motion.  Skin:    General: Skin is warm and dry.     Capillary Refill: Capillary refill takes less than 2 seconds.  Neurological:     General: No focal deficit present.     Mental Status: He is alert. Mental status is at baseline.  Psychiatric:        Mood and Affect: Mood normal.      Arrived for home visit for Babatunde who was alert and oriented reporting he was feeling good and had no complaints on assessment. Lung sounds clear, no swelling noted. Vitals are as noted. Brennden and I reviewed medications and pill box filled accordingly. Prem reports he saw his [place] doctor yesterday after having a few nights of auditory hallucinations at bedtime. Chief reports he has not had any today. Maxey denied any other symptoms or complaints. Eithan was started on new medication and seroquel was increased to 175m. I made changes and updated chart. JJeneen Rinksand I also reviewed appointments and wrote them in his planner. Home visit complete. I will see JDewanin one week.    Refills: NONE  Future Appointments  Date Time Provider [place] 02/23/2020 11:00 AM RHollace KinnierL, DO [facility] None  02/27/2020  1:40 PM MLarey Dresser MD [facility] None  03/07/2020  1:00 PM [name], DNelda Bucks NP [facility] None  03/15/2020  9:00 AM [facility]  BAY [facility] None  04/03/2020  7:00 AM [facility] DEVICE REMOTES [facility]  04/12/2020  2:15 PM TEvans Lance MD [facility]  07/03/2020  7:00 AM [facility] DEVICE REMOTES [facility]  10/02/2020  7:00 AM [facility] DEVICE REMOTES [facility]  01/01/2021  7:00 AM [facility] DEVICE REMOTES [facility]  01/29/2021  1:00 PM [name], Dinah C, NP [facility] None  04/02/2021  7:00 AM [facility] DEVICE REMOTES [facility]  07/02/2021  7:00 AM [facility] DEVICE REMOTES [facility]     ACTION: Home visit completed Next visit planned for one week

## 2020-02-23 ENCOUNTER — Ambulatory Visit: Payer: Medicare Other | Admitting: Internal Medicine

## 2020-02-27 ENCOUNTER — Ambulatory Visit (HOSPITAL_COMMUNITY)
Admission: RE | Admit: 2020-02-27 | Discharge: 2020-02-27 | Disposition: A | Discharge status: Home / Self Care | Payer: Medicare Other | Source: Ambulatory Visit | Attending: Cardiology | Admitting: Cardiology

## 2020-02-27 ENCOUNTER — Other Ambulatory Visit: Payer: Self-pay

## 2020-02-27 VITALS — BP 136/88 | HR 83 | Ht 76.0 in | Wt 178.6 lb

## 2020-02-27 DIAGNOSIS — I5022 Chronic systolic (congestive) heart failure: Secondary | ICD-10-CM

## 2020-02-27 DIAGNOSIS — Z87891 Personal history of nicotine dependence: Secondary | ICD-10-CM | POA: Insufficient documentation

## 2020-02-27 DIAGNOSIS — J439 Emphysema, unspecified: Secondary | ICD-10-CM | POA: Diagnosis not present

## 2020-02-27 DIAGNOSIS — Z7982 Long term (current) use of aspirin: Secondary | ICD-10-CM | POA: Insufficient documentation

## 2020-02-27 DIAGNOSIS — D631 Anemia in chronic kidney disease: Secondary | ICD-10-CM | POA: Insufficient documentation

## 2020-02-27 DIAGNOSIS — Z95810 Presence of automatic (implantable) cardiac defibrillator: Secondary | ICD-10-CM | POA: Diagnosis not present

## 2020-02-27 DIAGNOSIS — Z79899 Other long term (current) drug therapy: Secondary | ICD-10-CM | POA: Insufficient documentation

## 2020-02-27 DIAGNOSIS — I130 Hypertensive heart and chronic kidney disease with heart failure and stage 1 through stage 4 chronic kidney disease, or unspecified chronic kidney disease: Secondary | ICD-10-CM | POA: Insufficient documentation

## 2020-02-27 DIAGNOSIS — R911 Solitary pulmonary nodule: Secondary | ICD-10-CM

## 2020-02-27 DIAGNOSIS — I428 Other cardiomyopathies: Secondary | ICD-10-CM | POA: Insufficient documentation

## 2020-02-27 DIAGNOSIS — N183 Chronic kidney disease, stage 3 unspecified: Secondary | ICD-10-CM | POA: Insufficient documentation

## 2020-02-27 LAB — BASIC METABOLIC PANEL
Anion gap: 8 (ref 5–15)
BUN: 26 mg/dL — ABNORMAL HIGH (ref 8–23)
CO2: 25 mmol/L (ref 22–32)
Calcium: 8.6 mg/dL — ABNORMAL LOW (ref 8.9–10.3)
Chloride: 105 mmol/L (ref 98–111)
Creatinine, Ser: 2.13 mg/dL — ABNORMAL HIGH (ref 0.61–1.24)
GFR, Estimated: 32 mL/min — ABNORMAL LOW (ref >60)
Glucose, Bld: 103 mg/dL — ABNORMAL HIGH (ref 70–99)
Potassium: 4.8 mmol/L (ref 3.5–5.1)
Sodium: 138 mmol/L (ref 135–145)

## 2020-02-27 MED ORDER — METOPROLOL SUCCINATE ER 25 MG PO TB24
25.0000 mg | ORAL_TABLET | Freq: Two times a day (BID) | ORAL | 6 refills | Status: DC
Start: 2020-02-27 — End: 2020-03-28

## 2020-02-27 NOTE — Progress Notes (Signed)
PCP: Gayland Curry, DO Cardiology: Dr. Leandro Reasoner Brent [name] is a 75 y.o. with a history of Crohn's disease, celiac disease,HTN, COPD,priortobacco use, CKD 3, chronic anemia, and bipolar disorder.Diagnosed with systolic HF in 6/29 (echo with EF 30-35%).   Admitted 3/11-3/16/20 with SOB and hypoxia. Found to have PNA and required BIPAP and antibiotics. Blood cultures were negative. Echo showed newly reduced EF 30-35%. Cardiology consulted and he had nuclear stress test, which was read as "high risk" due to low EF. Per Dr Sallyanne Kuster, did not think changes were indicative of coronary disease and cath was not pursued due this and due to CKD. HF meds optimized. Limited with CKD3. He had one short run of NSVT. Also had AKI on CKD3, but creatinine improved to baseline 1.77 on day of discharge.  He was seen in ED 05/12/18 with SOB after misplacing his nebulizer. BNP was elevated, so he was given 40 mg IV lasix and discharged with lasix 20 mg BID. Referred to HF clinic.  He was admitted again in 7/20 with weakness, orthostatic hypotension, and AKI.  Cardiac meds were held.  He was noted to be severely deconditioned, and he was discharged to SNF.  He remained in SNF x 2 wks and is now home.   Echo was done in 8/20, EF 20-25% with mild LVH and normal RV.  He had RHC in 8/20 showing normal filling pressures and good cardiac output (surprisingly).   LHC/RHC in 3/21 showed no significant CAD, low filling pressures, preserved cardiac output. Echo in 9/21 with EF 25-30%.  Medtronic ICD placed in 11/21.   He returns today for followup of CHF.  He seems to be doing well.  Weight is down 5 lbs.  No dyspnea walking on flat ground.  No orthopnea/PND.  No chest pain.  No lightheadedness.  No diarrhea, Crohns seems stable. Main complaint is knee and shoulder arthritis.   Labs (7/20): K 5.1, creatinine 2.25 Labs (8/20): K 4.8, creatinine 2.65 Labs (9/20): hgb 9.7 Labs (12/20): K 4.1, creatinine 1.76 Labs (3/21):  K 5, creatinine 2.25 Labs (6/21): hgb 12 Labs (11/21): K 4.3, creatinine 2.62, hgb 12.2  Medtronic device interrogation: Stable thoracic impedance.   PMH: 1. Crohns disease: Gets Remicade.  2. Celiac disease.  3. COPD: Prior smoker.  - CT chest (7/20) with mild emphysema.  - PFTs (9/20): Mild obstruction, mild restriction.  - High resolution CT chest (10/20): No ILD, +chronic bronchitis.  4. CKD: Stage 3.  5. HTN 6. Bipolar disorder 7. H/o BPPV 8. Gout 9. Chronic systolic CHF: Nonischemic cardiomyopathy.  Medtronic ICD.  - Echo (3/20): EF 30-35%, mild LVH, normal RV.  - Cardiolite (3/20): EF 28%, no ischemia, fixed defects in anteroseptal and inferolateral walls.  - Echo (8/20): EF 20-25%, diffuse hypokinesis, mild LVH, normal RV size and systolic function, normal IVC size.  - RHC (8/20): mean RA 3, PA 21/10, mean PCWP 5, CI 3.2 Fick and 3.5 Thermo - RHC/LHC (3/21): No significant CAD; mean RA 1, PA 20/6, mean PCWP 6, CI 4.57  Social History   Socioeconomic History  . Marital status: Divorced    Spouse name: Not on file  . Number of children: 1  . Years of education: 68  . Highest education level: Not on file  Occupational History  . Occupation: retired  . Occupation: Veteran  Tobacco Use  . Smoking status: Former Smoker    Packs/day: 1.00    Years: 49.00    Pack years: 49.00  Types: Cigarettes    Start date: 04/11/1956    Quit date: 04/17/2014    Years since quitting: 5.8  . Smokeless tobacco: Never Used  Vaping Use  . Vaping Use: Never used  Substance and Sexual Activity  . Alcohol use: No    Alcohol/week: 0.0 standard drinks  . Drug use: No  . Sexual activity: Never  Other Topics Concern  . Not on file  Social History Narrative  . Not on file   Social Determinants of Health   Financial Resource Strain: Not on file  Food Insecurity: Not on file  Transportation Needs: Not on file  Physical Activity: Not on file  Stress: Not on file  Social  Connections: Not on file  Intimate Partner Violence: Not on file   Family History  Problem Relation Age of Onset  . Diabetes Mother        maternal grandmother  . Uterine cancer Mother   . Emphysema Father   . Pneumonia Maternal Grandmother   . Colon cancer Neg Hx    ROS: All systems reviewed and negative except as per HPI.   Current Outpatient Medications  Medication Sig Dispense Refill  . acetaminophen (TYLENOL) 160 MG/5ML liquid Take 325 mg by mouth every 4 (four) hours as needed for fever.    Marland Kitchen allopurinol (ZYLOPRIM) 100 MG tablet Take 2 tablets (200 mg total) by mouth daily. 60 tablet 5  . aspirin EC 81 MG tablet Take 1 tablet (81 mg total) by mouth daily. 90 tablet 3  . BUDESONIDE PO Take 9 mg by mouth in the morning.    . Cholecalciferol (VITAMIN D) 125 MCG (5000 UT) CAPS Take 5,000 Units by mouth daily.     . dapagliflozin propanediol (FARXIGA) 10 MG TABS tablet Take 1 tablet (10 mg total) by mouth daily before breakfast. 30 tablet 6  . divalproex (DEPAKOTE ER) 500 MG 24 hr tablet Take 1,500 mg by mouth at bedtime.     Marland Kitchen epoetin alfa (EPOGEN) 10000 UNIT/ML injection Inject 10,000 Units into the skin every 30 (thirty) days.    . ferrous sulfate 325 (65 FE) MG tablet Take 325 mg by mouth in the morning and at bedtime.    . furosemide (LASIX) 20 MG tablet Take 20 mg by mouth every Monday, Wednesday, and Friday.    . hydrALAZINE (APRESOLINE) 25 MG tablet Take 25 mg by mouth 3 (three) times daily.    Marland Kitchen inFLIXimab (REMICADE) 100 MG injection Infuse Remicade IV schedule 1 75m/kg every 8 weeks Premedicate with Tylenol 500-65101mby mouth and Benadryl 25-5041my mouth prior to infusion. Last PPD was on 12/2009.  1 each 6  . isosorbide mononitrate (IMDUR) 30 MG 24 hr tablet Take 1 tablet (30 mg total) by mouth daily. 90 tablet 3  . ivabradine (CORLANOR) 5 MG TABS tablet Take 1 tablet (5 mg total) by mouth 2 (two) times daily with a meal. 180 tablet 3  . loperamide (IMODIUM) 2 MG capsule  Take 1 capsule (2 mg total) by mouth 4 (four) times daily as needed for diarrhea or loose stools. 12 capsule 0  . Magnesium Oxide 420 MG TABS Take 2 tablets by mouth in the morning, at noon, and at bedtime.    . mirtazapine (REMERON) 7.5 MG tablet Take 1 tablet (7.5 mg total) by mouth at bedtime. 30 tablet 0  . omeprazole (PRILOSEC) 20 MG capsule Take 20 mg by mouth daily.    . prazosin (MINIPRESS) 1 MG capsule Take 1 mg  by mouth at bedtime.    Marland Kitchen QUEtiapine (SEROQUEL) 100 MG tablet Take 100 mg by mouth at bedtime.    . solifenacin (VESICARE) 5 MG tablet Take 5 mg by mouth daily.    . tamsulosin (FLOMAX) 0.4 MG CAPS capsule Take 1 capsule (0.4 mg total) by mouth daily. 30 capsule 2  . vitamin B-12 (CYANOCOBALAMIN) 500 MCG tablet Take 1,000 mcg by mouth daily.     . metoprolol succinate (TOPROL-XL) 25 MG 24 hr tablet Take 1 tablet (25 mg total) by mouth in the morning and at bedtime. 60 tablet 6  . triamcinolone (NASACORT) 55 MCG/ACT AERO nasal inhaler Place 2 sprays into the nose daily as needed (allergies). (Patient not taking: Reported on 02/27/2020)     No current facility-administered medications for this encounter.   BP 136/88   Pulse 83   Ht 6' 4"  (1.93 m)   Wt 81 kg (178 lb 9.6 oz)   SpO2 96%   BMI 21.74 kg/m  General: NAD Neck: No JVD, no thyromegaly or thyroid nodule.  Lungs: Clear to auscultation bilaterally with normal respiratory effort. CV: Nondisplaced PMI.  Heart regular S1/S2, no S3/S4, no murmur.  No peripheral edema.  No carotid bruit.  Normal pedal pulses.  Abdomen: Soft, nontender, no hepatosplenomegaly, no distention.  Skin: Intact without lesions or rashes.  Neurologic: Alert and oriented x 3.  Psych: Normal affect. Extremities: No clubbing or cyanosis.  HEENT: Normal.   Assessment/Plan: 1. Chronic systolic CHF: Nonischemic cardiomyoapthy.  Diagnosed by echo in 3/20, EF 30-35%.  LHC in 3/21 with no significant CAD. He has no chest pain.  Medication titration has  been limited by orthostasis/low BP.  Echo in 8/20 showed EF 20-25% with mild LVH and normal RV function.  With resting sinus tachycardia, I was concerned for low output HF, but RHC in 8/20 and again in 3/21 showed normal cardiac output and filling pressures. Echo in 9/21 with EF 25-30%, Medtronic ICD placed.  Today, NYHA class II (improved) with no volume overload by exam.   Weight down. Has had trouble with low BP and orthostasis in the past, but BP good today.  Also med titration has been limited by CKD.  - Continue current hydralazine/Imdur.  - Increase Toprol XL to 25 mg bid.  - Continue ivabradine 5 mg bid.   - Not taking Lasix.  - No ACEI/ARB/ARNI with low BP and elevated creatinine.  - Start Farxiga 10 mg daily. BMET today and in 10 days.  - If creatinine trending down, add spironolactone next.  - He would be a difficult candidate for advanced therapies with deconditioning, renal dysfunction, and lives alone.  2. CKD: Stage 3.  BMET today.  3. COPD: Mild emphysema on prior chest CT.  PFTs in 9/20 showed mild obstruction and mild restriction.  High resolution CT chest 10/20 showed no ILD, +chronic bronchitis.  - Repeat CT chests to follow small lung nodule given smoking history.   Followup with HF pharmacist in 3 wks for 2 visits to titrate meds, then see me in 2 months.    Loralie Champagne 02/27/2020

## 2020-02-27 NOTE — Patient Instructions (Addendum)
INCREASE Toprol XL 78m   Labs done today, your results will be available in MyChart, we will contact you for abnormal readings.  Non-Cardiac CT scanning, (CAT scanning), is a noninvasive, special x-ray that produces cross-sectional images of the body using x-rays and a computer. CT scans help physicians diagnose and treat medical conditions. For some CT exams, a contrast material is used to enhance visibility in the area of the body being studied. CT scans provide greater clarity and reveal more details than regular x-ray exams. ONCE WE APPROVE WITH INSURANCE, WE WILL CONTACT YOU TO SCHEDULE  Your physician recommends that you schedule a follow-up appointment in: 3 weeks with pharmacy  Your physician recommends that you schedule a follow-up appointment in: 2 months with Dr. MAundra Dubin If you have any questions or concerns before your next appointment please send u[place]aa message through [facility]or call our office at [phone]    TO LEAVE A MESSAGE FOR THE NURSE SELECT OPTION 2, PLEASE LEAVE A MESSAGE INCLUDING: . YOUR NAME . DATE OF BIRTH . CALL BACK NUMBER . REASON FOR CALL**this is important as we prioritize the call backs  YOU WILL RECEIVE A CALL BACK THE SAME DAY AS LONG AS YOU CALL BEFORE 4:00 PM

## 2020-02-29 ENCOUNTER — Telehealth (HOSPITAL_COMMUNITY): Payer: Self-pay | Admitting: Cardiology

## 2020-02-29 ENCOUNTER — Telehealth (HOSPITAL_COMMUNITY): Payer: Self-pay | Admitting: Licensed Clinical Social Worker

## 2020-02-29 ENCOUNTER — Telehealth (HOSPITAL_COMMUNITY): Payer: Self-pay

## 2020-02-29 ENCOUNTER — Other Ambulatory Visit (HOSPITAL_COMMUNITY): Payer: Self-pay

## 2020-02-29 NOTE — Progress Notes (Signed)
Paramedicine Encounter    Patient ID: Brent [name], male    DOB: Jul 17, 1945, 75 y.o.   MRN: [id number]   Patient Care Team: Gayland Curry, DO as PCP - General (Geriatric Medicine) Martinique, Peter M, MD as PCP - Cardiology (Cardiology) Larey Dresser, MD as PCP - Advanced Heart Failure (Cardiology) Elmarie Shiley, MD (Nephrology) Milus Banister, MD (Gastroenterology)  Patient Active Problem List   Diagnosis Date Noted  . Chronic systolic heart failure ([place]) 01/02/2020  . Decreased appetite 10/08/2019  . Weakness 10/08/2019  . Hyperkalemia 08/26/2019  . Chronic systolic CHF (congestive heart failure) ([place]) 08/26/2019  . Type 2 diabetes mellitus with stage 3 chronic kidney disease ([place]) 08/26/2019  . Chronic obstructive pulmonary disease ([place]) 06/20/2019  . Ventricular tachycardia ([place]) 06/20/2019  . Acute on chronic systolic (congestive) heart failure ([place]) 04/25/2019  . Metabolic acidosis 41/74/0814  . Orthostasis 09/07/2018  . AKI (acute kidney injury) ([place])   . Immunosuppressed status ([place])   . Macrocytic anemia   . Chronic combined systolic and diastolic congestive heart failure ([place]) 06/03/2018  . Candida esophagitis ([place]) 09/22/2017  . History of smoking 30 or more pack years 01/05/2017  . Bipolar 1 disorder ([place])   . BPH (benign prostatic hyperplasia) 03/31/2013  . 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 Essential hypertension, benign 03/31/2013  . Acute renal failure superimposed on stage 3 chronic kidney disease ([place]) 06/04/2012  . 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[place]) 01/23/2010    Current Outpatient Medications:  .  acetaminophen (TYLENOL) 160 MG/5ML liquid, Take 325 mg by mouth every 4 (four) hours as needed for fever., Disp: , Rfl:  .  allopurinol (ZYLOPRIM) 100 MG tablet, Take 2 tablets (200 mg total) by mouth daily., Disp: 60 tablet, Rfl: 5 .  aspirin EC 81 MG tablet, Take 1 tablet (81 mg total) by mouth daily., Disp: 90 tablet, Rfl: 3 .  BUDESONIDE PO, Take 9  mg by mouth in the morning., Disp: , Rfl:  .  Cholecalciferol (VITAMIN D) 125 MCG (5000 UT) CAPS, Take 5,000 Units by mouth daily. , Disp: , Rfl:  .  dapagliflozin propanediol (FARXIGA) 10 MG TABS tablet, Take 1 tablet (10 mg total) by mouth daily before breakfast., Disp: 30 tablet, Rfl: 6 .  divalproex (DEPAKOTE ER) 500 MG 24 hr tablet, Take 1,500 mg by mouth at bedtime. , Disp: , Rfl:  .  epoetin alfa (EPOGEN) 10000 UNIT/ML injection, Inject 10,000 Units into the skin every 30 (thirty) days., Disp: , Rfl:  .  ferrous sulfate 325 (65 FE) MG tablet, Take 325 mg by mouth in the morning and at bedtime., Disp: , Rfl:  .  furosemide (LASIX) 20 MG tablet, Take 20 mg by mouth every Monday, Wednesday, and Friday., Disp: , Rfl:  .  hydrALAZINE (APRESOLINE) 25 MG tablet, Take 25 mg by mouth 3 (three) times daily., Disp: , Rfl:  .  inFLIXimab (REMICADE) 100 MG injection, Infuse Remicade IV schedule 1 78m/kg every 8 weeks Premedicate with Tylenol 500-6538mby mouth and Benadryl 25-5037my mouth prior to infusion. Last PPD was on 12/2009. , Disp: 1 each, Rfl: 6 .  isosorbide mononitrate (IMDUR) 30 MG 24 hr tablet, Take 1 tablet (30 mg total) by mouth daily., Disp: 90 tablet, Rfl: 3 .  ivabradine (CORLANOR) 5 MG TABS tablet, Take 1 tablet (5 mg total) by mouth 2 (two) times daily with a meal., Disp: 180 tablet, Rfl: 3 .  loperamide (IMODIUM) 2 MG capsule, Take 1 capsule (  2 mg total) by mouth 4 (four) times daily as needed for diarrhea or loose stools., Disp: 12 capsule, Rfl: 0 .  Magnesium Oxide 420 MG TABS, Take 2 tablets by mouth in the morning, at noon, and at bedtime., Disp: , Rfl:  .  metoprolol succinate (TOPROL-XL) 25 MG 24 hr tablet, Take 1 tablet (25 mg total) by mouth in the morning and at bedtime., Disp: 60 tablet, Rfl: 6 .  mirtazapine (REMERON) 7.5 MG tablet, Take 1 tablet (7.5 mg total) by mouth at bedtime., Disp: 30 tablet, Rfl: 0 .  omeprazole (PRILOSEC) 20 MG capsule, Take 20 mg by mouth daily.,  Disp: , Rfl:  .  prazosin (MINIPRESS) 1 MG capsule, Take 1 mg by mouth at bedtime., Disp: , Rfl:  .  QUEtiapine (SEROQUEL) 100 MG tablet, Take 100 mg by mouth at bedtime., Disp: , Rfl:  .  solifenacin (VESICARE) 5 MG tablet, Take 5 mg by mouth daily., Disp: , Rfl:  .  tamsulosin (FLOMAX) 0.4 MG CAPS capsule, Take 1 capsule (0.4 mg total) by mouth daily., Disp: 30 capsule, Rfl: 2 .  triamcinolone (NASACORT) 55 MCG/ACT AERO nasal inhaler, Place 2 sprays into the nose daily as needed (allergies). (Patient not taking: Reported on 02/27/2020), Disp: , Rfl:  .  vitamin B-12 (CYANOCOBALAMIN) 500 MCG tablet, Take 1,000 mcg by mouth daily. , Disp: , Rfl:  Allergies  Allergen Reactions  . Azathioprine Other (See Comments)    REACTION: affected WBC "Almost died"  . Ciprofloxacin Other (See Comments)    Unknown rxn  . Levaquin [Levofloxacin In D5w] Other (See Comments)    Unknown rxn  . Plendil [Felodipine] Other (See Comments)    Unknown rxn     Social History   Socioeconomic History  . Marital status: Divorced    Spouse name: Not on file  . Number of children: 1  . Years of education: 65  . Highest education level: Not on file  Occupational History  . Occupation: retired  . Occupation: Veteran  Tobacco Use  . Smoking status: Former Smoker    Packs/day: 1.00    Years: 49.00    Pack years: 49.00    Types: Cigarettes    Start date: 04/11/1956    Quit date: 04/17/2014    Years since quitting: 5.8  . Smokeless tobacco: Never Used  Vaping Use  . Vaping Use: Never used  Substance and Sexual Activity  . Alcohol use: No    Alcohol/week: 0.0 standard drinks  . Drug use: No  . Sexual activity: Never  Other Topics Concern  . Not on file  Social History Narrative  . Not on file   Social Determinants of Health   Financial Resource Strain: Not on file  Food Insecurity: Not on file  Transportation Needs: Not on file  Physical Activity: Not on file  Stress: Not on file  Social  Connections: Not on file  Intimate Partner Violence: Not on file    Physical Exam Vitals reviewed.  Constitutional:      Appearance: He is normal weight.  HENT:     Head: Normocephalic.     Nose: Nose normal.     Mouth/Throat:     Mouth: Mucous membranes are moist.     Pharynx: Oropharynx is clear.  Eyes:     Pupils: Pupils are equal, round, and reactive to light.  Cardiovascular:     Rate and Rhythm: Normal rate and regular rhythm.     Pulses: Normal pulses.  Heart sounds: Normal heart sounds.  Pulmonary:     Effort: Pulmonary effort is normal.     Breath sounds: Normal breath sounds.  Abdominal:     General: Abdomen is flat.     Palpations: Abdomen is soft.  Musculoskeletal:        General: Normal range of motion.     Cervical back: Normal range of motion.     Right lower leg: No edema.     Left lower leg: No edema.  Skin:    General: Skin is warm and dry.     Capillary Refill: Capillary refill takes less than 2 seconds.  Neurological:     General: No focal deficit present.     Mental Status: He is alert. Mental status is at baseline.  Psychiatric:        Mood and Affect: Mood normal.     Arrived for home visit for Skip who was alert and oriented but appeared fatigued today and was "shuffling" worse than normal today. Patient states this happens every now and again. I assessed patient and obtained vitals and they were within normal range. I reviewed notes from visit with Dr. [name] on Monday and called Chantel at clinic to update her that patient HAS been taking Lasix MWF 59m since 12/2019 and Farxiga 174msince 11/2019. Medications reviewed and confirmed. Pill box filled accordingly. No lower leg edema, no JVD. Lung sounds clear. JaJeneen Rinksnd I reviewed appointments and confirmed same in his planner. I also filled out [employer]work for Energy Assistance. I turned same into [employer] visit complete. I will see patient in one week.  Refills: Mag Oxide   Remeron     Future Appointments  Date Time Provider [place]1/20/2022  9:00 AM [facility] [place] [facility] None  03/19/2020  2:00 PM [facility] PHARMACY [facility] None  04/03/2020  7:00 AM [facility] DEVICE REMOTES [facility]  04/12/2020  2:15 PM TaEvans LanceMD [facility]  05/18/2020 12:00 PM McLarey DresserMD [facility] None  07/03/2020  7:00 AM [facility] DEVICE REMOTES [facility]  10/02/2020  7:00 AM [facility] DEVICE REMOTES [facility]  01/01/2021  7:00 AM [facility] DEVICE REMOTES [facility]  01/29/2021  1:00 PM [name], Dinah C, NP [facility] None  04/02/2021  7:00 AM [facility] DEVICE REMOTES [facility]  07/02/2021  7:00 AM [facility] DEVICE REMOTES [facility]     ACTION: Home visit completed Next visit planned for one week

## 2020-02-29 NOTE — Telephone Encounter (Signed)
Contacted Medicare [employer] regarding patient's CT Chest without contrast. No precert required Case ID # [id number]

## 2020-02-29 NOTE — Telephone Encounter (Signed)
LIEAP application submitted to [facility] to review for energy assistance.  CSW will continue to follow and assist as needed  Jorge Ny, [facility] Desk#: [phone] Cell#: [phone]

## 2020-02-29 NOTE — Telephone Encounter (Signed)
Heather with paramedicine called for medication reconciliation during patients home visit. Called to report pt is currently taking farxiga 10 mg one tab daily. Pt has been on for quite sometime. And pt is currently taking lasix 20 mg three times per week.   Note to provider as Juluis Rainier

## 2020-03-01 ENCOUNTER — Other Ambulatory Visit: Payer: Self-pay | Admitting: Family

## 2020-03-01 DIAGNOSIS — R630 Anorexia: Secondary | ICD-10-CM

## 2020-03-01 DIAGNOSIS — F331 Major depressive disorder, recurrent, moderate: Secondary | ICD-10-CM

## 2020-03-07 ENCOUNTER — Other Ambulatory Visit: Payer: Self-pay | Admitting: Family

## 2020-03-07 ENCOUNTER — Telehealth (HOSPITAL_COMMUNITY): Payer: Self-pay | Admitting: Licensed Clinical Social Worker

## 2020-03-07 ENCOUNTER — Other Ambulatory Visit (HOSPITAL_COMMUNITY): Payer: Self-pay

## 2020-03-07 ENCOUNTER — Ambulatory Visit: Payer: Medicare Other | Admitting: Family

## 2020-03-07 DIAGNOSIS — R630 Anorexia: Secondary | ICD-10-CM

## 2020-03-07 DIAGNOSIS — F331 Major depressive disorder, recurrent, moderate: Secondary | ICD-10-CM

## 2020-03-07 MED ORDER — MIRTAZAPINE 7.5 MG PO TABS: ORAL_TABLET | ORAL | 3 refills | Status: DC

## 2020-03-07 NOTE — Telephone Encounter (Signed)
Community Paramedic informed CSW that pt BP cuff has broken and that he is need of a new one.  New cuff ordered- anticipated delivery tomorrow.  Will continue to follow and assist as needed  Jorge Ny, [facility] Desk#: [phone] Cell#: [phone]

## 2020-03-07 NOTE — Progress Notes (Signed)
Paramedicine Encounter    Patient ID: Brent [name], male    DOB: 1945-11-02, 75 y.o.   MRN: [id number]   Patient Care Team: Gayland Curry, DO as PCP - General (Geriatric Medicine) Martinique, Peter M, MD as PCP - Cardiology (Cardiology) Larey Dresser, MD as PCP - Advanced Heart Failure (Cardiology) Elmarie Shiley, MD (Nephrology) Milus Banister, MD (Gastroenterology)  Patient Active Problem List   Diagnosis Date Noted  . Chronic systolic heart failure ([place]) 01/02/2020  . Decreased appetite 10/08/2019  . Weakness 10/08/2019  . Hyperkalemia 08/26/2019  . Chronic systolic CHF (congestive heart failure) ([place]) 08/26/2019  . Type 2 diabetes mellitus with stage 3 chronic kidney disease ([place]) 08/26/2019  . Chronic obstructive pulmonary disease ([place]) 06/20/2019  . Ventricular tachycardia ([place]) 06/20/2019  . Acute on chronic systolic (congestive) heart failure ([place]) 04/25/2019  . Metabolic acidosis 05/39/7673  . Orthostasis 09/07/2018  . AKI (acute kidney injury) ([place])   . Immunosuppressed status ([place])   . Macrocytic anemia   . Chronic combined systolic and diastolic congestive heart failure ([place]) 06/03/2018  . Candida esophagitis ([place]) 09/22/2017  . History of smoking 30 or more pack years 01/05/2017  . Bipolar 1 disorder ([place])   . BPH (benign prostatic hyperplasia) 03/31/2013  . 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 Essential hypertension, benign 03/31/2013  . Acute renal failure superimposed on stage 3 chronic kidney disease ([place]) 06/04/2012  . 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[place]) 01/23/2010    Current Outpatient Medications:  .  acetaminophen (TYLENOL) 160 MG/5ML liquid, Take 325 mg by mouth every 4 (four) hours as needed for fever., Disp: , Rfl:  .  allopurinol (ZYLOPRIM) 100 MG tablet, Take 2 tablets (200 mg total) by mouth daily., Disp: 60 tablet, Rfl: 5 .  aspirin EC 81 MG tablet, Take 1 tablet (81 mg total) by mouth daily., Disp: 90 tablet, Rfl: 3 .  BUDESONIDE PO, Take 9  mg by mouth in the morning., Disp: , Rfl:  .  Cholecalciferol (VITAMIN D) 125 MCG (5000 UT) CAPS, Take 5,000 Units by mouth daily. , Disp: , Rfl:  .  dapagliflozin propanediol (FARXIGA) 10 MG TABS tablet, Take 1 tablet (10 mg total) by mouth daily before breakfast., Disp: 30 tablet, Rfl: 6 .  divalproex (DEPAKOTE ER) 500 MG 24 hr tablet, Take 1,500 mg by mouth at bedtime. , Disp: , Rfl:  .  epoetin alfa (EPOGEN) 10000 UNIT/ML injection, Inject 10,000 Units into the skin every 30 (thirty) days., Disp: , Rfl:  .  ferrous sulfate 325 (65 FE) MG tablet, Take 325 mg by mouth in the morning and at bedtime., Disp: , Rfl:  .  furosemide (LASIX) 20 MG tablet, Take 20 mg by mouth every Monday, Wednesday, and Friday., Disp: , Rfl:  .  hydrALAZINE (APRESOLINE) 25 MG tablet, Take 25 mg by mouth 3 (three) times daily., Disp: , Rfl:  .  inFLIXimab (REMICADE) 100 MG injection, Infuse Remicade IV schedule 1 72m/kg every 8 weeks Premedicate with Tylenol 500-6534mby mouth and Benadryl 25-5064my mouth prior to infusion. Last PPD was on 12/2009. , Disp: 1 each, Rfl: 6 .  isosorbide mononitrate (IMDUR) 30 MG 24 hr tablet, Take 1 tablet (30 mg total) by mouth daily., Disp: 90 tablet, Rfl: 3 .  ivabradine (CORLANOR) 5 MG TABS tablet, Take 1 tablet (5 mg total) by mouth 2 (two) times daily with a meal., Disp: 180 tablet, Rfl: 3 .  loperamide (IMODIUM) 2 MG capsule, Take 1 capsule (  2 mg total) by mouth 4 (four) times daily as needed for diarrhea or loose stools., Disp: 12 capsule, Rfl: 0 .  Magnesium Oxide 420 MG TABS, Take 2 tablets by mouth in the morning, at noon, and at bedtime., Disp: , Rfl:  .  metoprolol succinate (TOPROL-XL) 25 MG 24 hr tablet, Take 1 tablet (25 mg total) by mouth in the morning and at bedtime., Disp: 60 tablet, Rfl: 6 .  mirtazapine (REMERON) 7.5 MG tablet, TAKE 1 TABLET(7.5 MG) BY MOUTH AT BEDTIME, Disp: 30 tablet, Rfl: 0 .  omeprazole (PRILOSEC) 20 MG capsule, Take 20 mg by mouth daily., Disp: ,  Rfl:  .  prazosin (MINIPRESS) 1 MG capsule, Take 1 mg by mouth at bedtime., Disp: , Rfl:  .  QUEtiapine (SEROQUEL) 100 MG tablet, Take 100 mg by mouth at bedtime., Disp: , Rfl:  .  solifenacin (VESICARE) 5 MG tablet, Take 5 mg by mouth daily., Disp: , Rfl:  .  tamsulosin (FLOMAX) 0.4 MG CAPS capsule, Take 1 capsule (0.4 mg total) by mouth daily., Disp: 30 capsule, Rfl: 2 .  triamcinolone (NASACORT) 55 MCG/ACT AERO nasal inhaler, Place 2 sprays into the nose daily as needed (allergies)., Disp: , Rfl:  .  vitamin B-12 (CYANOCOBALAMIN) 500 MCG tablet, Take 1,000 mcg by mouth daily. , Disp: , Rfl:  Allergies  Allergen Reactions  . Azathioprine Other (See Comments)    REACTION: affected WBC "Almost died"  . Ciprofloxacin Other (See Comments)    Unknown rxn  . Levaquin [Levofloxacin In D5w] Other (See Comments)    Unknown rxn  . Plendil [Felodipine] Other (See Comments)    Unknown rxn     Social History   Socioeconomic History  . Marital status: Divorced    Spouse name: Not on file  . Number of children: 1  . Years of education: 63  . Highest education level: Not on file  Occupational History  . Occupation: retired  . Occupation: Veteran  Tobacco Use  . Smoking status: Former Smoker    Packs/day: 1.00    Years: 49.00    Pack years: 49.00    Types: Cigarettes    Start date: 04/11/1956    Quit date: 04/17/2014    Years since quitting: 5.8  . Smokeless tobacco: Never Used  Vaping Use  . Vaping Use: Never used  Substance and Sexual Activity  . Alcohol use: No    Alcohol/week: 0.0 standard drinks  . Drug use: No  . Sexual activity: Never  Other Topics Concern  . Not on file  Social History Narrative  . Not on file   Social Determinants of Health   Financial Resource Strain: Not on file  Food Insecurity: Not on file  Transportation Needs: Not on file  Physical Activity: Not on file  Stress: Not on file  Social Connections: Not on file  Intimate Partner Violence: Not on  file    Physical Exam Vitals reviewed.  Constitutional:      Appearance: He is normal weight.  HENT:     Head: Normocephalic.     Nose: Nose normal.     Mouth/Throat:     Mouth: Mucous membranes are moist.     Pharynx: Oropharynx is clear.  Eyes:     Pupils: Pupils are equal, round, and reactive to light.  Cardiovascular:     Rate and Rhythm: Normal rate and regular rhythm.     Pulses: Normal pulses.     Heart sounds: Normal heart sounds.  Pulmonary:     Effort: Pulmonary effort is normal.     Breath sounds: Normal breath sounds.  Abdominal:     General: Abdomen is flat.     Palpations: Abdomen is soft.  Musculoskeletal:        General: Normal range of motion.     Right lower leg: No edema.     Left lower leg: No edema.  Skin:    General: Skin is warm and dry.     Capillary Refill: Capillary refill takes less than 2 seconds.  Neurological:     General: No focal deficit present.     Mental Status: He is alert. Mental status is at baseline.  Psychiatric:        Mood and Affect: Mood normal.      Arrived for home visit for [place] who was alert and oriented seated in his living room. Reynolds reports he was feeling pretty good other than not having much of an appetite. Kasin reported he has not been eating a lot over the last week or so. Vitals were obtained patients weight was down almost 10lbs from last week. Akashdeep and I reviewed medications and confirmed same. Pill box was filled accordingly. [place] is out of [place] and it has been ordered. Jeneen Rinks and I reviewed upcoming appointments and they are as noted. Mikah and I talked about food insecurity and he reports he has no problems getting food or groceries he just hasn't been hungry. Lux agreed to home visit in one week. I will see Nikoli next week, home visit complete.   NEW BP MONITOR WILL BE DELIVERED TOMORROW.   REFILL: LASIX      Future Appointments  Date Time Provider [place]  03/15/2020  9:00 AM [facility]  [place] [facility] None  03/16/2020  2:30 PM WL-[facility] 1 [facility] [place]  03/19/2020  2:00 PM [place] None  04/03/2020  7:00 AM [facility] DEVICE REMOTES [facility]  04/12/2020  2:15 PM Evans Lance, MD [facility]  05/18/2020 12:00 PM Larey Dresser, MD [facility] None  07/03/2020  7:00 AM [facility] DEVICE REMOTES [facility]  10/02/2020  7:00 AM [facility] DEVICE REMOTES [facility]  01/01/2021  7:00 AM [facility] DEVICE REMOTES [facility]  01/29/2021  1:00 PM [name], Dinah C, NP [facility] None  04/02/2021  7:00 AM [facility] DEVICE REMOTES [facility]  07/02/2021  7:00 AM [facility] DEVICE REMOTES [facility]     ACTION: Home visit completed Next visit planned for ONE WEEK

## 2020-03-08 ENCOUNTER — Other Ambulatory Visit (HOSPITAL_COMMUNITY): Payer: Self-pay | Admitting: *Deleted

## 2020-03-08 MED ORDER — FUROSEMIDE 20 MG PO TABS: 20.0000 mg | ORAL_TABLET | ORAL | 3 refills | Status: DC

## 2020-03-15 ENCOUNTER — Other Ambulatory Visit (HOSPITAL_COMMUNITY): Payer: Self-pay

## 2020-03-15 ENCOUNTER — Inpatient Hospital Stay (HOSPITAL_COMMUNITY)
Admission: RE | Admit: 2020-03-15 | Discharge: 2020-03-15 | Disposition: A | Discharge status: Home / Self Care | Payer: Medicare Other | Source: Ambulatory Visit

## 2020-03-15 NOTE — Progress Notes (Signed)
Paramedicine Encounter    Patient ID: Brent [name], male    DOB: 29-Jan-1946, 75 y.o.   MRN: [id number]   Patient Care Team: Gayland Curry, DO as PCP - General (Geriatric Medicine) [place], Peter M, MD as PCP - Cardiology (Cardiology) Larey Dresser, MD as PCP - Advanced Heart Failure (Cardiology) Elmarie Shiley, MD (Nephrology) Milus Banister, MD (Gastroenterology)  Patient Active Problem List   Diagnosis Date Noted  . Chronic systolic heart failure ([place]) 01/02/2020  . Decreased appetite 10/08/2019  . Weakness 10/08/2019  . Hyperkalemia 08/26/2019  . Chronic systolic CHF (congestive heart failure) ([place]) 08/26/2019  . Type 2 diabetes mellitus with stage 3 chronic kidney disease ([place]) 08/26/2019  . Chronic obstructive pulmonary disease ([place]) 06/20/2019  . Ventricular tachycardia ([place]) 06/20/2019  . Acute on chronic systolic (congestive) heart failure ([place]) 04/25/2019  . Metabolic acidosis 88/41/6606  . Orthostasis 09/07/2018  . AKI (acute kidney injury) ([place])   . Immunosuppressed status ([place])   . Macrocytic anemia   . Chronic combined systolic and diastolic congestive heart failure ([place]) 06/03/2018  . Candida esophagitis ([place]) 09/22/2017  . History of smoking 30 or more pack years 01/05/2017  . Bipolar 1 disorder ([place])   . BPH (benign prostatic hyperplasia) 03/31/2013  . 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 Essential hypertension, benign 03/31/2013  . Acute renal failure superimposed on stage 3 chronic kidney disease ([place]) 06/04/2012  . 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[place]) 01/23/2010    Current Outpatient Medications:  .  acetaminophen (TYLENOL) 160 MG/5ML liquid, Take 325 mg by mouth every 4 (four) hours as needed for fever., Disp: , Rfl:  .  allopurinol (ZYLOPRIM) 100 MG tablet, Take 2 tablets (200 mg total) by mouth daily., Disp: 60 tablet, Rfl: 5 .  aspirin EC 81 MG tablet, Take 1 tablet (81 mg total) by mouth daily., Disp: 90 tablet, Rfl: 3 .  BUDESONIDE PO, Take 9  mg by mouth in the morning., Disp: , Rfl:  .  Cholecalciferol (VITAMIN D) 125 MCG (5000 UT) CAPS, Take 5,000 Units by mouth daily. , Disp: , Rfl:  .  dapagliflozin propanediol (FARXIGA) 10 MG TABS tablet, Take 1 tablet (10 mg total) by mouth daily before breakfast., Disp: 30 tablet, Rfl: 6 .  divalproex (DEPAKOTE ER) 500 MG 24 hr tablet, Take 1,500 mg by mouth at bedtime. , Disp: , Rfl:  .  epoetin alfa (EPOGEN) 10000 UNIT/ML injection, Inject 10,000 Units into the skin every 30 (thirty) days., Disp: , Rfl:  .  ferrous sulfate 325 (65 FE) MG tablet, Take 325 mg by mouth in the morning and at bedtime., Disp: , Rfl:  .  furosemide (LASIX) 20 MG tablet, Take 1 tablet (20 mg total) by mouth every Monday, Wednesday, and Friday., Disp: 30 tablet, Rfl: 3 .  hydrALAZINE (APRESOLINE) 25 MG tablet, Take 25 mg by mouth 3 (three) times daily., Disp: , Rfl:  .  inFLIXimab (REMICADE) 100 MG injection, Infuse Remicade IV schedule 1 60m/kg every 8 weeks Premedicate with Tylenol 500-6520mby mouth and Benadryl 25-5060my mouth prior to infusion. Last PPD was on 12/2009. , Disp: 1 each, Rfl: 6 .  isosorbide mononitrate (IMDUR) 30 MG 24 hr tablet, Take 1 tablet (30 mg total) by mouth daily., Disp: 90 tablet, Rfl: 3 .  ivabradine (CORLANOR) 5 MG TABS tablet, Take 1 tablet (5 mg total) by mouth 2 (two) times daily with a meal., Disp: 180 tablet, Rfl: 3 .  loperamide (IMODIUM) 2 MG  capsule, Take 1 capsule (2 mg total) by mouth 4 (four) times daily as needed for diarrhea or loose stools., Disp: 12 capsule, Rfl: 0 .  Magnesium Oxide 420 MG TABS, Take 2 tablets by mouth in the morning, at noon, and at bedtime., Disp: , Rfl:  .  metoprolol succinate (TOPROL-XL) 25 MG 24 hr tablet, Take 1 tablet (25 mg total) by mouth in the morning and at bedtime., Disp: 60 tablet, Rfl: 6 .  mirtazapine (REMERON) 7.5 MG tablet, TAKE 1 TABLET(7.5 MG) BY MOUTH AT BEDTIME, Disp: 30 tablet, Rfl: 3 .  omeprazole (PRILOSEC) 20 MG capsule, Take 20 mg  by mouth daily., Disp: , Rfl:  .  prazosin (MINIPRESS) 1 MG capsule, Take 1 mg by mouth at bedtime., Disp: , Rfl:  .  QUEtiapine (SEROQUEL) 100 MG tablet, Take 100 mg by mouth at bedtime., Disp: , Rfl:  .  solifenacin (VESICARE) 5 MG tablet, Take 5 mg by mouth daily., Disp: , Rfl:  .  tamsulosin (FLOMAX) 0.4 MG CAPS capsule, Take 1 capsule (0.4 mg total) by mouth daily., Disp: 30 capsule, Rfl: 2 .  triamcinolone (NASACORT) 55 MCG/ACT AERO nasal inhaler, Place 2 sprays into the nose daily as needed (allergies)., Disp: , Rfl:  .  vitamin B-12 (CYANOCOBALAMIN) 500 MCG tablet, Take 1,000 mcg by mouth daily. , Disp: , Rfl:  Allergies  Allergen Reactions  . Azathioprine Other (See Comments)    REACTION: affected WBC "Almost died"  . Ciprofloxacin Other (See Comments)    Unknown rxn  . Levaquin [Levofloxacin In D5w] Other (See Comments)    Unknown rxn  . Plendil [Felodipine] Other (See Comments)    Unknown rxn     Social History   Socioeconomic History  . Marital status: Divorced    Spouse name: Not on file  . Number of children: 1  . Years of education: 48  . Highest education level: Not on file  Occupational History  . Occupation: retired  . Occupation: Veteran  Tobacco Use  . Smoking status: Former Smoker    Packs/day: 1.00    Years: 49.00    Pack years: 49.00    Types: Cigarettes    Start date: 04/11/1956    Quit date: 04/17/2014    Years since quitting: 5.9  . Smokeless tobacco: Never Used  Vaping Use  . Vaping Use: Never used  Substance and Sexual Activity  . Alcohol use: No    Alcohol/week: 0.0 standard drinks  . Drug use: No  . Sexual activity: Never  Other Topics Concern  . Not on file  Social History Narrative  . Not on file   Social Determinants of Health   Financial Resource Strain: Not on file  Food Insecurity: Not on file  Transportation Needs: Not on file  Physical Activity: Not on file  Stress: Not on file  Social Connections: Not on file  Intimate  Partner Violence: Not on file    Physical Exam Vitals reviewed.  Constitutional:      Appearance: Normal appearance. He is normal weight.  HENT:     Head: Normocephalic.     Nose: Nose normal.     Mouth/Throat:     Mouth: Mucous membranes are moist.     Pharynx: Oropharynx is clear.  Eyes:     Pupils: Pupils are equal, round, and reactive to light.  Cardiovascular:     Rate and Rhythm: Normal rate and regular rhythm.     Pulses: Normal pulses.  Heart sounds: Normal heart sounds.  Pulmonary:     Effort: Pulmonary effort is normal.     Breath sounds: Normal breath sounds.  Abdominal:     General: Abdomen is flat.     Palpations: Abdomen is soft.  Musculoskeletal:        General: Normal range of motion.     Cervical back: Normal range of motion.     Right lower leg: No edema.     Left lower leg: No edema.  Skin:    General: Skin is warm and dry.  Neurological:     General: No focal deficit present.     Mental Status: He is alert. Mental status is at baseline.  Psychiatric:        Mood and Affect: Mood normal.    Arrived for home visit for Brent [name] who was alert and oriented reporting feeling good. Vitals obtained. Brent [name] denied any complaints. No shortness of breath, dizziness, weakness or trouble sleeping. Brent [name] reports his appetite is better this week. Weight up 6lbs from last week but no signs of fluid build up noted. No edema, swelling in lower legs, abdomen. Lungs clear with no noted JVD. Medications were reviewed. I gave patient his medications for today. Pill box filled accordingly. I filled two weeks worth of medicines in 2 pill boxes. Brent [name] agreed with same.   -BP machine was delivered successfully.   -Lasix in #2 box missing Friday AM.   Refills:  Lasix Prazosin Solfenicin   Home visit complete. I will see patient in one week for check up and med rec.      Future Appointments  Date Time Provider [place]  03/16/2020  2:30 PM [facility] 1 [facility] [place]  [facility]  03/19/2020  2:00 PM [place] [facility] None  04/03/2020  7:00 AM [facility] DEVICE REMOTES [facility]  04/12/2020  2:15 PM Evans Lance, MD [facility]  05/18/2020 12:00 PM Larey Dresser, MD [facility] None  07/03/2020  7:00 AM [facility] DEVICE REMOTES [facility]  10/02/2020  7:00 AM [facility] DEVICE REMOTES [facility]  01/01/2021  7:00 AM [facility] DEVICE REMOTES [facility]  01/29/2021  1:00 PM [name], Dinah C, NP [facility] None  04/02/2021  7:00 AM [facility] DEVICE REMOTES [facility]  07/02/2021  7:00 AM [facility] DEVICE REMOTES [facility]     ACTION: Home visit completed Next visit planned for one week

## 2020-03-16 ENCOUNTER — Other Ambulatory Visit: Payer: Self-pay

## 2020-03-16 ENCOUNTER — Ambulatory Visit (HOSPITAL_COMMUNITY)
Admission: RE | Admit: 2020-03-16 | Discharge: 2020-03-16 | Disposition: A | Discharge status: Home / Self Care | Payer: Medicare Other | Source: Ambulatory Visit | Attending: Cardiology | Admitting: Cardiology

## 2020-03-16 ENCOUNTER — Encounter (HOSPITAL_COMMUNITY): Payer: Self-pay

## 2020-03-16 DIAGNOSIS — J984 Other disorders of lung: Secondary | ICD-10-CM | POA: Diagnosis not present

## 2020-03-16 DIAGNOSIS — J90 Pleural effusion, not elsewhere classified: Secondary | ICD-10-CM | POA: Diagnosis not present

## 2020-03-16 DIAGNOSIS — I313 Pericardial effusion (noninflammatory): Secondary | ICD-10-CM | POA: Diagnosis not present

## 2020-03-16 DIAGNOSIS — R911 Solitary pulmonary nodule: Secondary | ICD-10-CM | POA: Diagnosis not present

## 2020-03-16 DIAGNOSIS — I700 Atherosclerosis of aorta: Secondary | ICD-10-CM | POA: Diagnosis not present

## 2020-03-17 NOTE — Progress Notes (Incomplete)
***In Progress*** PCP: Gayland Curry, DO Cardiology: Dr. [name]  HPI:  Brent [name] a 75 y.o.with a history of Crohn's disease, celiac disease,HTN, COPD,priortobacco use, CKD 3, chronic anemia, and bipolar disorder.Diagnosed with systolic HF in 1/19 (echo with EF 30-35%).   Admitted 05/05/18-05/10/18 with SOB and hypoxia. Found to have PNA and required BIPAP and antibiotics. Blood cultures were negative. Echo showed newly reduced EF 30-35%. Cardiology consulted and he had nuclear stress test, which was read as "high risk" due to low EF. Per Dr Sallyanne Kuster, did not think changes were indicative of coronary disease, so cath was not pursued due to this and due to CKD. HF meds optimized. Limited with CKD3. He had one short run of NSVT. Also had AKI on CKD3, but creatinine improved to baseline 1.77 on day of discharge.  He was seen in ED 05/12/18 with SOB after misplacing his nebulizer. BNP was elevated, so he was given 40 mg IV furosemide and discharged with furosemide 20 mg BID. Referred to HF clinic.  He was admitted again in 7/20 with weakness, orthostatic hypotension, and AKI.  Cardiac meds were held.  He was noted to be severely deconditioned, and he was discharged to SNF.  He remained in SNF x 2 wks and is now home.   Echo was done in 8/20, EF 20-25% with mild LVH and normal RV.  He had RHC in 8/20 showing normal filling pressures and good cardiac output (surprisingly).   LHC/RHC in 3/21 showed no significant CAD, low filling pressures, preserved cardiac output. Echo in 9/21 with EF 25-30%.  Medtronic ICD placed in 11/21.   He recently returned for followup of CHF with Dr. [name] on 02/27/20.  He seemed to be doing well.  Weight was down 5 lbs.  No dyspnea walking on flat ground.  No orthopnea/PND.  No chest pain.  No lightheadedness.  No diarrhea, Crohns seemed stable. Main complaint was knee and shoulder arthritis.  He is followed by paramedicine team. At most recent home visit on  03/15/20, his weight was up 6 lbs from the week prior, but no signs of fluid build up noted. No LEE. Lungs clear with no noted JVD.  Today he returns to HF clinic for pharmacist medication titration. At last visit with MD, metoprolol succinate was increased to 25 mg BID.   136/88, 83, 178 lbs - Meds limited by CKD and hx low BP/orthostasis but hasn't been low since october Plan A: spiro as long as Scr trend down Plan B: if euvolemic and weight stable, metop 50 bid vs 25/50 Plan C: If BP up and can't do others, incr hydral 37.5 tid Pharm visit x2 then DM in 2 months   Overall feeling ***. Dizziness, lightheadedness, fatigue:  Chest pain or palpitations:  How is your breathing?: *** SOB: Able to complete all ADLs. Activity level ***  Weight at home pounds. Takes furosemide/torsemide/bumex *** mg *** daily.  LEE PND/Orthopnea  Appetite *** Low-salt diet:   Physical Exam Cost/affordability of meds  HF Medications: Metoprolol succinate 25 mg BID Farxiga 10 mg daily Hydralazine 25 mg TID Isosorbide mononitrate 30 mg daily Ivabradine 5 mg BID Furosemide 20 mg TIW (Mon/Wed/Fri)  Has the patient been experiencing any side effects to the medications prescribed?  {YES NO:22349}  Does the patient have any problems obtaining medications due to transportation or finances?   {YES NO:22349}  Understanding of regimen: {excellent/good/fair/poor:19665} Understanding of indications: {excellent/good/fair/poor:19665} Potential of compliance: {excellent/good/fair/poor:19665} - followed by paramedicine Patient understands to  avoid NSAIDs. Patient understands to avoid decongestants.    Pertinent Lab Values: . Serum creatinine ***, BUN ***, Potassium ***, Sodium ***, BNP ***, Magnesium ***, Digoxin ***   Vital Signs: . Weight: *** (last clinic weight: ***) . Blood pressure: ***  . Heart rate: ***   Assessment/Plan: 1. Chronic systolic CHF: Nonischemic cardiomyoapthy.  Diagnosed by echo  in 3/20, EF 30-35%.  LHC in 3/21 with no significant CAD. He has no chest pain.  Medication titration has been limited by orthostasis/low BP.  Echo in 8/20 showed EF 20-25% with mild LVH and normal RV function.  With resting sinus tachycardia was concerned for low output HF, but RHC in 8/20 and again in 3/21 showed normal cardiac output and filling pressures. Echo in 9/21 with EF 25-30%, Medtronic ICD placed. Has had trouble with low BP and orthostasis in the past.  Also med titration has been limited by CKD.  - NYHA II (improved), euvolemic on exam. - Continue furosemide 20 mg TIW on M/W/F - Continue metoprolol succinate 25 mg BID - ***Start spironolactone 12.5 mg daily - Continue Farxiga 10 mg daily - Continue hydralazine 25 mg TID - Continue isosorbide mononitrate 30 mg daily - Continue ivabradine 5 mg BID - No ACEI/ARB/ARNI with low BP and elevated creatinine.  - He would be a difficult candidate for advanced therapies with deconditioning, renal dysfunction, and lives alone.   2. CKD: Stage 3.  BMET today. ***  3. COPD: Mild emphysema on prior chest CT.  PFTs in 9/20 showed mild obstruction and mild restriction.  High resolution CT chest 10/20 showed no ILD, +chronic bronchitis.  - CT chest completed to follow small lung nodule given smoking history.   Richardine Service, PharmD, [place] PGY2 Cardiology Pharmacy Resident  Audry Riles, PharmD, BCPS, [facility], CPP Heart Failure Clinic Pharmacist [phone]

## 2020-03-19 ENCOUNTER — Telehealth (HOSPITAL_COMMUNITY): Payer: Self-pay

## 2020-03-19 ENCOUNTER — Ambulatory Visit (HOSPITAL_COMMUNITY)
Admission: RE | Admit: 2020-03-19 | Discharge: 2020-03-19 | Disposition: A | Discharge status: Home / Self Care | Payer: Medicare Other | Source: Ambulatory Visit | Attending: Internal Medicine | Admitting: Internal Medicine

## 2020-03-19 ENCOUNTER — Telehealth (HOSPITAL_COMMUNITY): Payer: Self-pay | Admitting: Vascular Surgery

## 2020-03-19 ENCOUNTER — Other Ambulatory Visit: Payer: Self-pay

## 2020-03-19 VITALS — BP 108/72 | HR 70 | Wt 179.4 lb

## 2020-03-19 DIAGNOSIS — I5022 Chronic systolic (congestive) heart failure: Secondary | ICD-10-CM | POA: Diagnosis not present

## 2020-03-19 DIAGNOSIS — Z7984 Long term (current) use of oral hypoglycemic drugs: Secondary | ICD-10-CM | POA: Diagnosis not present

## 2020-03-19 DIAGNOSIS — Z79899 Other long term (current) drug therapy: Secondary | ICD-10-CM | POA: Diagnosis not present

## 2020-03-19 DIAGNOSIS — K5090 Crohn's disease, unspecified, without complications: Secondary | ICD-10-CM | POA: Diagnosis not present

## 2020-03-19 DIAGNOSIS — Z87891 Personal history of nicotine dependence: Secondary | ICD-10-CM | POA: Insufficient documentation

## 2020-03-19 DIAGNOSIS — I130 Hypertensive heart and chronic kidney disease with heart failure and stage 1 through stage 4 chronic kidney disease, or unspecified chronic kidney disease: Secondary | ICD-10-CM | POA: Diagnosis not present

## 2020-03-19 DIAGNOSIS — N183 Chronic kidney disease, stage 3 unspecified: Secondary | ICD-10-CM | POA: Insufficient documentation

## 2020-03-19 DIAGNOSIS — N2889 Other specified disorders of kidney and ureter: Secondary | ICD-10-CM

## 2020-03-19 DIAGNOSIS — J439 Emphysema, unspecified: Secondary | ICD-10-CM | POA: Insufficient documentation

## 2020-03-19 DIAGNOSIS — D631 Anemia in chronic kidney disease: Secondary | ICD-10-CM | POA: Diagnosis not present

## 2020-03-19 DIAGNOSIS — K900 Celiac disease: Secondary | ICD-10-CM | POA: Insufficient documentation

## 2020-03-19 DIAGNOSIS — F319 Bipolar disorder, unspecified: Secondary | ICD-10-CM | POA: Diagnosis not present

## 2020-03-19 MED ORDER — ISOSORBIDE MONONITRATE ER 60 MG PO TB24: 60.0000 mg | ORAL_TABLET | Freq: Every day | ORAL | 3 refills | Status: DC

## 2020-03-19 NOTE — Telephone Encounter (Signed)
Left pt detailed  message giving renal ultrasound appt 1/28 @ 12:15pm, asked pt to call back to confirm appt

## 2020-03-19 NOTE — Telephone Encounter (Signed)
Pt aware of results of CT scan. Renal ultrasound ordered. Pt appreciative of call. Pt to await call back to schedule ultrasound.

## 2020-03-19 NOTE — Patient Instructions (Signed)
It was a pleasure seeing you today!  MEDICATIONS: -We are changing your medications today -Increase imdur to 60 mg (1 tablet) daily.  -Call if you have questions about your medications.  NEXT APPOINTMENT: Return to clinic in 3 weeks with Pharmacy Clinic.  In general, to take care of your heart failure: -Limit your fluid intake to 2 Liters (half-gallon) per day.   -Limit your salt intake to ideally 2-3 grams (2000-3000 mg) per day. -Weigh yourself daily and record, and bring that "weight diary" to your next appointment.  (Weight gain of 2-3 pounds in 1 day typically means fluid weight.) -The medications for your heart are to help your heart and help you live longer.   -Please contact us before stopping any of your heart medications.  Call the clinic at [phone] with questions or to reschedule future appointments.

## 2020-03-19 NOTE — Progress Notes (Signed)
PCP: Gayland Curry, DO Cardiology: Dr. [name]  HPI:  Brent [name] a 75 y.o.with a history of Crohn's disease, celiac disease,HTN, COPD,priortobacco use, CKD 3, chronic anemia, and bipolar disorder.Diagnosed with systolic HF in 5/53 (echo with EF 30-35%).   Admitted 05/05/18-05/10/18 with SOB and hypoxia. Found to have PNA and required BIPAP and antibiotics. Blood cultures were negative. Echo showed newly reduced EF 30-35%. Cardiology consulted and he had nuclear stress test, which was read as "high risk" due to low EF. Per Dr Sallyanne Kuster, did not think changes were indicative of coronary disease, so cath was not pursued due to this and due to CKD. HF meds optimized. Limited with CKD3. He had one short run of NSVT. Also had AKI on CKD3, but creatinine improved to baseline 1.77 on day of discharge.  He was seen in ED 05/12/18 with SOB after misplacing his nebulizer. BNP was elevated, so he was given 40 mg IV furosemide and discharged with furosemide 20 mg BID. Referred to HF clinic.  He was admitted again in 7/20 with weakness, orthostatic hypotension, and AKI.  Cardiac meds were held.  He was noted to be severely deconditioned, and he was discharged to SNF.  He remained in SNF x 2 wks and is now home.   Echo was done in 8/20, EF 20-25% with mild LVH and normal RV.  He had RHC in 8/20 showing normal filling pressures and good cardiac output (surprisingly).   LHC/RHC in 3/21 showed no significant CAD, low filling pressures, preserved cardiac output. Echo in 9/21 with EF 25-30%.  Medtronic ICD placed in 11/21.   He recently returned for followup of CHF with Dr. [name] on 02/27/20.  He seemed to be doing well.  Weight was down 5 lbs.  No dyspnea walking on flat ground.  No orthopnea/PND.  No chest pain.  No lightheadedness.  No diarrhea, Crohns seemed stable. Main complaint was knee and shoulder arthritis.  He is followed by paramedicine team. At most recent home visit on 03/15/20, his weight  was up 6 lbs from the week prior, but no signs of fluid build up noted. No LEE. Lungs clear with no noted JVD.  Today he returns to HF clinic for pharmacist medication titration. At last visit with MD, metoprolol succinate was increased to 25 mg BID. He is doing very well overall. BP lower in office today at 108/72, but he denies dizziness, lightheadedness, or fatigue. No chest pain or palpitations. Breathing is stable - can walk from entrance of HF clinic to patient room without getting SOB. Reports his weight dropped to 176 lbs with loss of appetite, but this has improved and now stable at 179 lbs. He takes furosemide 20 mg MWF and has not needed any extra. No LEE, orthopnea, or PND. Adhering to low-salt diet.  HF Medications: Metoprolol succinate 25 mg BID Farxiga 10 mg daily Hydralazine 25 mg TID Isosorbide mononitrate (Imdur) 30 mg daily Ivabradine 5 mg BID Furosemide 20 mg TIW (Mon/Wed/Fri)  Has the patient been experiencing any side effects to the medications prescribed?  no  Does the patient have any problems obtaining medications due to transportation or finances?   No - Has [employer] Medicare. Patient assistance for [name] and [place] pending for 2022.  Understanding of regimen: fair Understanding of indications: fair Potential of compliance: good - followed by paramedicine Patient understands to avoid NSAIDs. Patient understands to avoid decongestants.    Pertinent Lab Values (02/27/20): Marland Kitchen Serum creatinine 2.13, BUN 26, Potassium 4.8, Sodium  138  Vital Signs: . Weight: 179.4 lbs (last clinic weight: 178 lbs) . Blood pressure: 108/72  . Heart rate: 70   Assessment/Plan: 1. Chronic systolic CHF: Nonischemic cardiomyoapthy.  Diagnosed by echo in 3/20, EF 30-35%.  LHC in 3/21 with no significant CAD. He has no chest pain.  Medication titration has been limited by orthostasis/low BP.  Echo in 8/20 showed EF 20-25% with mild LVH and normal RV function.  With resting sinus tachycardia  was concerned for low output HF, but RHC in 8/20 and again in 3/21 showed normal cardiac output and filling pressures. Echo in 9/21 with EF 25-30%, Medtronic ICD placed. Has had trouble with low BP and orthostasis in the past.  Also med titration has been limited by CKD.  - NYHA II (improved), euvolemic on exam. - Continue furosemide 20 mg MWF - Continue metoprolol succinate 25 mg BID - Continue Farxiga 10 mg daily - Continue hydralazine 25 mg TID - Increase isosorbide mononitrate to 60 mg daily - Continue ivabradine 5 mg BID - No ACEI/ARB/ARNI with low BP and elevated creatinine.  - No spironolactone for now given elevated creatinine and borderline potassium. - He would be a difficult candidate for advanced therapies with deconditioning, renal dysfunction, and lives alone.   2. CKD: Stage 3. Check BMET at next visit.  3. COPD: Mild emphysema on prior chest CT.  PFTs in 9/20 showed mild obstruction and mild restriction.  High resolution CT chest 10/20 showed no ILD, +chronic bronchitis.  - CT chest completed to follow small lung nodule given smoking history.   Plan: 1) Medication changes: based on clinical presentation, vital signs and recent labs will increase isosorbide mononitrate to 60 mg daily. 2) Follow-up in 3 weeks in pharmacy clinic   Richardine Service, PharmD, [place] PGY2 Cardiology Pharmacy Resident  Audry Riles, PharmD, BCPS, [facility], CPP Heart Failure Clinic Pharmacist [phone]

## 2020-03-19 NOTE — Telephone Encounter (Signed)
-----   Message from Larey Dresser, MD sent at 03/17/2020  8:42 PM EST ----- Lung nodule benign, do not need to re-image.  There is a renal nodule, recommend renal ultrasound to fully assess.

## 2020-03-20 ENCOUNTER — Ambulatory Visit (HOSPITAL_COMMUNITY)
Admission: RE | Admit: 2020-03-20 | Discharge: 2020-03-20 | Disposition: A | Discharge status: Home / Self Care | Payer: Medicare Other | Attending: Nephrology | Admitting: Nephrology

## 2020-03-20 DIAGNOSIS — D631 Anemia in chronic kidney disease: Secondary | ICD-10-CM | POA: Diagnosis not present

## 2020-03-20 DIAGNOSIS — N184 Chronic kidney disease, stage 4 (severe): Secondary | ICD-10-CM | POA: Insufficient documentation

## 2020-03-20 LAB — IRON AND TIBC
Iron: 39 ug/dL — ABNORMAL LOW (ref 45–182)
Saturation Ratios: 17 % — ABNORMAL LOW (ref 17.9–39.5)
TIBC: 226 ug/dL — ABNORMAL LOW (ref 250–450)
UIBC: 187 ug/dL

## 2020-03-20 LAB — HEMOGLOBIN AND HEMATOCRIT, BLOOD
HCT: 36.5 % — ABNORMAL LOW (ref 39.0–52.0)
Hemoglobin: 11.3 g/dL — ABNORMAL LOW (ref 13.0–17.0)

## 2020-03-20 LAB — FERRITIN: Ferritin: 670 ng/mL — ABNORMAL HIGH (ref 24–336)

## 2020-03-20 MED ORDER — EPOETIN ALFA 10000 UNIT/ML IJ SOLN
10000.0000 [IU] | INTRAMUSCULAR | Status: DC
  Administered 2020-03-20: 10000 [IU] via SUBCUTANEOUS
  Filled 2020-03-20: qty 1

## 2020-03-20 NOTE — Discharge Instructions (Signed)

## 2020-03-20 NOTE — Progress Notes (Signed)
PATIENT CARE CENTER NOTE  Diagnosis:Anemia associated with Chronic Renal Failure, anemia associated with renal disease   Provider:Patel, Ulice Dash MD   Procedure:Epoetin Alfa (Procrit) injection   Note:Patient received sub-qProcritinjection inleftarm. Tolerated well. Labs drawn pre-injection and Hemoglobin was11.3. Patient'sBP was140/83.Discharge instructions given. Patient alert, oriented and ambulatoryat discharge.

## 2020-03-21 ENCOUNTER — Other Ambulatory Visit (HOSPITAL_COMMUNITY): Payer: Self-pay

## 2020-03-21 NOTE — Progress Notes (Signed)
Paramedicine Encounter    Patient ID: Brent [name], male    DOB: 12-22-1945, 75 y.o.   MRN: [id number]   Patient Care Team: Gayland Curry, DO as PCP - General (Geriatric Medicine) Martinique, Peter M, MD as PCP - Cardiology (Cardiology) Larey Dresser, MD as PCP - Advanced Heart Failure (Cardiology) Elmarie Shiley, MD (Nephrology) Milus Banister, MD (Gastroenterology)  Patient Active Problem List   Diagnosis Date Noted  . Chronic systolic heart failure ([place]) 01/02/2020  . Decreased appetite 10/08/2019  . Weakness 10/08/2019  . Hyperkalemia 08/26/2019  . Chronic systolic CHF (congestive heart failure) ([place]) 08/26/2019  . Type 2 diabetes mellitus with stage 3 chronic kidney disease ([place]) 08/26/2019  . Chronic obstructive pulmonary disease ([place]) 06/20/2019  . Ventricular tachycardia ([place]) 06/20/2019  . Acute on chronic systolic (congestive) heart failure ([place]) 04/25/2019  . Metabolic acidosis 20/35/5974  . Orthostasis 09/07/2018  . AKI (acute kidney injury) ([place])   . Immunosuppressed status ([place])   . Macrocytic anemia   . Chronic combined systolic and diastolic congestive heart failure ([place]) 06/03/2018  . Candida esophagitis ([place]) 09/22/2017  . History of smoking 30 or more pack years 01/05/2017  . Bipolar 1 disorder ([place])   . BPH (benign prostatic hyperplasia) 03/31/2013  . 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 Essential hypertension, benign 03/31/2013  . Acute renal failure superimposed on stage 3 chronic kidney disease ([employer]) 06/04/2012  . 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[place]) 01/23/2010    Current Outpatient Medications:  .  acetaminophen (TYLENOL) 160 MG/5ML liquid, Take 325 mg by mouth every 4 (four) hours as needed for fever., Disp: , Rfl:  .  allopurinol (ZYLOPRIM) 100 MG tablet, Take 2 tablets (200 mg total) by mouth daily., Disp: 60 tablet, Rfl: 5 .  aspirin EC 81 MG tablet, Take 1 tablet (81 mg total) by mouth daily., Disp: 90 tablet, Rfl: 3 .  Cholecalciferol  (VITAMIN D) 125 MCG (5000 UT) CAPS, Take 5,000 Units by mouth daily. , Disp: , Rfl:  .  dapagliflozin propanediol (FARXIGA) 10 MG TABS tablet, Take 1 tablet (10 mg total) by mouth daily before breakfast., Disp: 30 tablet, Rfl: 6 .  divalproex (DEPAKOTE ER) 500 MG 24 hr tablet, Take 1,500 mg by mouth at bedtime. , Disp: , Rfl:  .  epoetin alfa (EPOGEN) 10000 UNIT/ML injection, Inject 10,000 Units into the skin every 30 (thirty) days., Disp: , Rfl:  .  ferrous sulfate 325 (65 FE) MG tablet, Take 325 mg by mouth in the morning and at bedtime., Disp: , Rfl:  .  furosemide (LASIX) 20 MG tablet, Take 1 tablet (20 mg total) by mouth every Monday, Wednesday, and Friday., Disp: 30 tablet, Rfl: 3 .  hydrALAZINE (APRESOLINE) 25 MG tablet, Take 25 mg by mouth 3 (three) times daily., Disp: , Rfl:  .  inFLIXimab (REMICADE) 100 MG injection, Infuse Remicade IV schedule 1 41m/kg every 8 weeks Premedicate with Tylenol 500-6564mby mouth and Benadryl 25-5097my mouth prior to infusion. Last PPD was on 12/2009. , Disp: 1 each, Rfl: 6 .  isosorbide mononitrate (IMDUR) 60 MG 24 hr tablet, Take 1 tablet (60 mg total) by mouth daily., Disp: 90 tablet, Rfl: 3 .  ivabradine (CORLANOR) 5 MG TABS tablet, Take 1 tablet (5 mg total) by mouth 2 (two) times daily with a meal., Disp: 180 tablet, Rfl: 3 .  loperamide (IMODIUM) 2 MG capsule, Take 1 capsule (2 mg total) by mouth 4 (four) times daily as needed for  diarrhea or loose stools., Disp: 12 capsule, Rfl: 0 .  metoprolol succinate (TOPROL-XL) 25 MG 24 hr tablet, Take 1 tablet (25 mg total) by mouth in the morning and at bedtime., Disp: 60 tablet, Rfl: 6 .  mirtazapine (REMERON) 7.5 MG tablet, TAKE 1 TABLET(7.5 MG) BY MOUTH AT BEDTIME, Disp: 30 tablet, Rfl: 3 .  omeprazole (PRILOSEC) 20 MG capsule, Take 20 mg by mouth daily., Disp: , Rfl:  .  prazosin (MINIPRESS) 1 MG capsule, Take 1 mg by mouth at bedtime., Disp: , Rfl:  .  QUEtiapine (SEROQUEL) 100 MG tablet, Take 100 mg by  mouth at bedtime., Disp: , Rfl:  .  solifenacin (VESICARE) 5 MG tablet, Take 5 mg by mouth daily., Disp: , Rfl:  .  tamsulosin (FLOMAX) 0.4 MG CAPS capsule, Take 1 capsule (0.4 mg total) by mouth daily., Disp: 30 capsule, Rfl: 2 .  vitamin B-12 (CYANOCOBALAMIN) 500 MCG tablet, Take 1,000 mcg by mouth daily. , Disp: , Rfl:  Allergies  Allergen Reactions  . Azathioprine Other (See Comments)    REACTION: affected WBC "Almost died"  . Ciprofloxacin Other (See Comments)    Unknown rxn  . Levaquin [Levofloxacin In D5w] Other (See Comments)    Unknown rxn  . Plendil [Felodipine] Other (See Comments)    Unknown rxn     Social History   Socioeconomic History  . Marital status: Divorced    Spouse name: Not on file  . Number of children: 1  . Years of education: 66  . Highest education level: Not on file  Occupational History  . Occupation: retired  . Occupation: Veteran  Tobacco Use  . Smoking status: Former Smoker    Packs/day: 1.00    Years: 49.00    Pack years: 49.00    Types: Cigarettes    Start date: 04/11/1956    Quit date: 04/17/2014    Years since quitting: 5.9  . Smokeless tobacco: Never Used  Vaping Use  . Vaping Use: Never used  Substance and Sexual Activity  . Alcohol use: No    Alcohol/week: 0.0 standard drinks  . Drug use: No  . Sexual activity: Never  Other Topics Concern  . Not on file  Social History Narrative  . Not on file   Social Determinants of Health   Financial Resource Strain: Not on file  Food Insecurity: Not on file  Transportation Needs: Not on file  Physical Activity: Not on file  Stress: Not on file  Social Connections: Not on file  Intimate Partner Violence: Not on file    Physical Exam Vitals reviewed.  Constitutional:      Appearance: He is normal weight.  HENT:     Head: Normocephalic.     Nose: Nose normal.     Mouth/Throat:     Mouth: Mucous membranes are moist.     Pharynx: Oropharynx is clear.  Eyes:     Pupils: Pupils  are equal, round, and reactive to light.  Cardiovascular:     Rate and Rhythm: Normal rate and regular rhythm.     Pulses: Normal pulses.     Heart sounds: Normal heart sounds.  Pulmonary:     Effort: Pulmonary effort is normal.     Breath sounds: Normal breath sounds.  Abdominal:     General: Abdomen is flat.     Palpations: Abdomen is soft.  Musculoskeletal:        General: Normal range of motion.     Cervical back: Normal range  of motion.     Right lower leg: No edema.     Left lower leg: No edema.  Skin:    General: Skin is warm and dry.     Capillary Refill: Capillary refill takes less than 2 seconds.  Neurological:     General: No focal deficit present.     Mental Status: He is alert. Mental status is at baseline.  Psychiatric:        Mood and Affect: Mood normal.    Arrived for home visit for Johncarlo who was alert and oriented reporting he was feeling fine. No swelling, shortness of breath, dizziness or chest pain. Vitals were obtained. Vicent had not taken multiple doses of his Corlanor or Hydralazine over the last week. Patient had no yet had his medications this morning. I reviewed medications and filled pill box accordingly. Jeneen Rinks and I reviewed upcoming appointments and confirmed same writing them in his planner. I spoke to [facility] and made sure all medications get forwarded to [place] in [place]. [facility] staff agreed. I will see Josean in one week. Home visit complete.    NO REFILLS.     Future Appointments  Date Time Provider [place]  03/23/2020 12:30 PM [facility]-US 1 [facility]  04/03/2020  7:00 AM [facility] DEVICE REMOTES [facility]  04/09/2020  2:00 PM [facility] PHARMACY [facility] None  04/12/2020  2:15 PM Evans Lance, MD [facility]  04/20/2020  9:00 AM [facility] RM 1 WL-[facility] None  05/18/2020 12:00 PM Larey Dresser, MD [facility] None  07/03/2020  7:00 AM [facility] DEVICE REMOTES [facility]  10/02/2020  7:00 AM  [facility] DEVICE REMOTES [facility]  01/01/2021  7:00 AM [facility] DEVICE REMOTES [facility]  01/29/2021  1:00 PM [name], Dinah C, NP PSC-[facility] None  04/02/2021  7:00 AM [facility] DEVICE REMOTES [facility]  07/02/2021  7:00 AM [facility] DEVICE REMOTES [facility]     ACTION: Home visit completed Next visit planned for one week

## 2020-03-22 NOTE — Telephone Encounter (Signed)
Advanced Heart Failure Patient Advocate Encounter   Patient was approved to receive Farxiga from [employer]  Effective dates: 03/22/20 through 02/23/21  Sent message to nursing staff asking for a new RX to be sent to [employer].  Called [employer] to check the status of the patient's application. Representative stated they did not receive the application. Will refax and follow up.

## 2020-03-23 ENCOUNTER — Ambulatory Visit (HOSPITAL_COMMUNITY)
Admission: RE | Admit: 2020-03-23 | Discharge: 2020-03-23 | Disposition: A | Discharge status: Home / Self Care | Payer: Medicare Other | Source: Ambulatory Visit | Attending: Cardiology | Admitting: Cardiology

## 2020-03-23 ENCOUNTER — Other Ambulatory Visit: Payer: Self-pay

## 2020-03-23 ENCOUNTER — Other Ambulatory Visit (HOSPITAL_COMMUNITY): Payer: Self-pay | Admitting: *Deleted

## 2020-03-23 DIAGNOSIS — N281 Cyst of kidney, acquired: Secondary | ICD-10-CM | POA: Diagnosis not present

## 2020-03-23 DIAGNOSIS — N2889 Other specified disorders of kidney and ureter: Secondary | ICD-10-CM | POA: Diagnosis not present

## 2020-03-23 MED ORDER — DAPAGLIFLOZIN PROPANEDIOL 10 MG PO TABS: 10.0000 mg | ORAL_TABLET | Freq: Every day | ORAL | 3 refills | Status: DC

## 2020-03-27 ENCOUNTER — Telehealth (HOSPITAL_COMMUNITY): Payer: Self-pay | Admitting: Pharmacy Technician

## 2020-03-27 NOTE — Telephone Encounter (Signed)
Sent in [employer] and [employer] application via fax.  Will follow up.

## 2020-03-28 ENCOUNTER — Other Ambulatory Visit (HOSPITAL_COMMUNITY): Payer: Self-pay

## 2020-03-28 ENCOUNTER — Other Ambulatory Visit (HOSPITAL_COMMUNITY): Payer: Self-pay | Admitting: *Deleted

## 2020-03-28 ENCOUNTER — Other Ambulatory Visit: Payer: Self-pay | Admitting: Family

## 2020-03-28 DIAGNOSIS — F331 Major depressive disorder, recurrent, moderate: Secondary | ICD-10-CM

## 2020-03-28 DIAGNOSIS — R630 Anorexia: Secondary | ICD-10-CM

## 2020-03-28 MED ORDER — MIRTAZAPINE 7.5 MG PO TABS: ORAL_TABLET | ORAL | 3 refills | Status: DC

## 2020-03-28 MED ORDER — FUROSEMIDE 20 MG PO TABS: 20.0000 mg | ORAL_TABLET | ORAL | 0 refills | Status: DC

## 2020-03-28 MED ORDER — FUROSEMIDE 20 MG PO TABS: 20.0000 mg | ORAL_TABLET | ORAL | 3 refills | Status: DC

## 2020-03-28 MED ORDER — METOPROLOL SUCCINATE ER 25 MG PO TB24: 25.0000 mg | ORAL_TABLET | Freq: Two times a day (BID) | ORAL | 6 refills | Status: DC

## 2020-03-28 MED ORDER — ISOSORBIDE MONONITRATE ER 60 MG PO TB24: 60.0000 mg | ORAL_TABLET | Freq: Every day | ORAL | 3 refills | Status: DC

## 2020-03-28 NOTE — Progress Notes (Signed)
Paramedicine Encounter    Patient ID: Brent [name], male    DOB: 26-Oct-1945, 75 y.o.   MRN: [id number]   Patient Care Team: Gayland Curry, DO as PCP - General (Geriatric Medicine) [place]ique, Peter M, MD as PCP - Cardiology (Cardiology) Larey Dresser, MD as PCP - Advanced Heart Failure (Cardiology) Elmarie Shiley, MD (Nephrology) Milus Banister, MD (Gastroenterology)  Patient Active Problem List   Diagnosis Date Noted  . Chronic systolic heart failure ([place]) 01/02/2020  . Decreased appetite 10/08/2019  . Weakness 10/08/2019  . Hyperkalemia 08/26/2019  . Chronic systolic CHF (congestive heart failure) ([place]) 08/26/2019  . Type 2 diabetes mellitus with stage 3 chronic kidney disease ([place]) 08/26/2019  . Chronic obstructive pulmonary disease ([place]) 06/20/2019  . Ventricular tachycardia ([place]) 06/20/2019  . Acute on chronic systolic (congestive) heart failure ([place]) 04/25/2019  . Metabolic acidosis 75/11/2583  . Orthostasis 09/07/2018  . AKI (acute kidney injury) ([place])   . Immunosuppressed status ([place])   . Macrocytic anemia   . Chronic combined systolic and diastolic congestive heart failure ([place]) 06/03/2018  . Candida esophagitis ([place]) 09/22/2017  . History of smoking 30 or more pack years 01/05/2017  . Bipolar 1 disorder ([place])   . BPH (benign prostatic hyperplasia) 03/31/2013  . 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 Essential hypertension, benign 03/31/2013  . Acute renal failure superimposed on stage 3 chronic kidney disease ([place]) 06/04/2012  . 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[place]) 01/23/2010    Current Outpatient Medications:  .  acetaminophen (TYLENOL) 160 MG/5ML liquid, Take 325 mg by mouth every 4 (four) hours as needed for fever., Disp: , Rfl:  .  allopurinol (ZYLOPRIM) 100 MG tablet, Take 2 tablets (200 mg total) by mouth daily., Disp: 60 tablet, Rfl: 5 .  aspirin EC 81 MG tablet, Take 1 tablet (81 mg total) by mouth daily., Disp: 90 tablet, Rfl: 3 .  Cholecalciferol  (VITAMIN D) 125 MCG (5000 UT) CAPS, Take 5,000 Units by mouth daily. , Disp: , Rfl:  .  dapagliflozin propanediol (FARXIGA) 10 MG TABS tablet, Take 1 tablet (10 mg total) by mouth daily before breakfast., Disp: 90 tablet, Rfl: 3 .  divalproex (DEPAKOTE ER) 500 MG 24 hr tablet, Take 1,500 mg by mouth at bedtime. , Disp: , Rfl:  .  epoetin alfa (EPOGEN) 10000 UNIT/ML injection, Inject 10,000 Units into the skin every 30 (thirty) days., Disp: , Rfl:  .  ferrous sulfate 325 (65 FE) MG tablet, Take 325 mg by mouth in the morning and at bedtime., Disp: , Rfl:  .  furosemide (LASIX) 20 MG tablet, Take 1 tablet (20 mg total) by mouth every Monday, Wednesday, and Friday., Disp: 30 tablet, Rfl: 3 .  hydrALAZINE (APRESOLINE) 25 MG tablet, Take 25 mg by mouth 3 (three) times daily., Disp: , Rfl:  .  inFLIXimab (REMICADE) 100 MG injection, Infuse Remicade IV schedule 1 63m/kg every 8 weeks Premedicate with Tylenol 500-6540mby mouth and Benadryl 25-5037my mouth prior to infusion. Last PPD was on 12/2009. , Disp: 1 each, Rfl: 6 .  isosorbide mononitrate (IMDUR) 60 MG 24 hr tablet, Take 1 tablet (60 mg total) by mouth daily., Disp: 90 tablet, Rfl: 3 .  ivabradine (CORLANOR) 5 MG TABS tablet, Take 1 tablet (5 mg total) by mouth 2 (two) times daily with a meal., Disp: 180 tablet, Rfl: 3 .  loperamide (IMODIUM) 2 MG capsule, Take 1 capsule (2 mg total) by mouth 4 (four) times daily as needed for  diarrhea or loose stools., Disp: 12 capsule, Rfl: 0 .  metoprolol succinate (TOPROL-XL) 25 MG 24 hr tablet, Take 1 tablet (25 mg total) by mouth in the morning and at bedtime., Disp: 60 tablet, Rfl: 6 .  mirtazapine (REMERON) 7.5 MG tablet, TAKE 1 TABLET(7.5 MG) BY MOUTH AT BEDTIME, Disp: 30 tablet, Rfl: 3 .  omeprazole (PRILOSEC) 20 MG capsule, Take 20 mg by mouth daily., Disp: , Rfl:  .  prazosin (MINIPRESS) 1 MG capsule, Take 1 mg by mouth at bedtime., Disp: , Rfl:  .  QUEtiapine (SEROQUEL) 100 MG tablet, Take 100 mg by  mouth at bedtime., Disp: , Rfl:  .  solifenacin (VESICARE) 5 MG tablet, Take 5 mg by mouth daily., Disp: , Rfl:  .  tamsulosin (FLOMAX) 0.4 MG CAPS capsule, Take 1 capsule (0.4 mg total) by mouth daily., Disp: 30 capsule, Rfl: 2 .  vitamin B-12 (CYANOCOBALAMIN) 500 MCG tablet, Take 1,000 mcg by mouth daily. , Disp: , Rfl:  Allergies  Allergen Reactions  . Azathioprine Other (See Comments)    REACTION: affected WBC "Almost died"  . Ciprofloxacin Other (See Comments)    Unknown rxn  . Levaquin [Levofloxacin In D5w] Other (See Comments)    Unknown rxn  . Plendil [Felodipine] Other (See Comments)    Unknown rxn     Social History   Socioeconomic History  . Marital status: Divorced    Spouse name: Not on file  . Number of children: 1  . Years of education: 63  . Highest education level: Not on file  Occupational History  . Occupation: retired  . Occupation: Veteran  Tobacco Use  . Smoking status: Former Smoker    Packs/day: 1.00    Years: 49.00    Pack years: 49.00    Types: Cigarettes    Start date: 04/11/1956    Quit date: 04/17/2014    Years since quitting: 5.9  . Smokeless tobacco: Never Used  Vaping Use  . Vaping Use: Never used  Substance and Sexual Activity  . Alcohol use: No    Alcohol/week: 0.0 standard drinks  . Drug use: No  . Sexual activity: Never  Other Topics Concern  . Not on file  Social History Narrative  . Not on file   Social Determinants of Health   Financial Resource Strain: Not on file  Food Insecurity: Not on file  Transportation Needs: Not on file  Physical Activity: Not on file  Stress: Not on file  Social Connections: Not on file  Intimate Partner Violence: Not on file    Physical Exam Vitals reviewed.  Constitutional:      Appearance: Normal appearance. He is normal weight.  HENT:     Head: Normocephalic.     Nose: Nose normal.     Mouth/Throat:     Mouth: Mucous membranes are moist.     Pharynx: Oropharynx is clear.  Eyes:      Pupils: Pupils are equal, round, and reactive to light.  Cardiovascular:     Rate and Rhythm: Normal rate and regular rhythm.     Pulses: Normal pulses.     Heart sounds: Normal heart sounds.  Pulmonary:     Effort: Pulmonary effort is normal.     Breath sounds: Normal breath sounds.  Abdominal:     General: Abdomen is flat.     Palpations: Abdomen is soft.  Musculoskeletal:        General: Normal range of motion.     Cervical back:  Normal range of motion.     Right lower leg: No edema.     Left lower leg: No edema.  Skin:    General: Skin is warm and dry.     Capillary Refill: Capillary refill takes less than 2 seconds.  Neurological:     General: No focal deficit present.     Mental Status: He is alert. Mental status is at baseline.  Psychiatric:        Mood and Affect: Mood normal.     Arrived for home visit for Brent [name] who was seated in his recliner alert and oriented with no complaints reporting he feels good. Gibbs denied any recent increased dizziness, chest pain, shortness of breath, trouble sleeping or eating. Vitals were obtained. No edema noted on assessment, lung sounds clear with no JVD. I reviewed medications and filled two pill boxes for Brent [name]. Jeneen Rinks and I reviewed upcoming appointments and he agreed with same and confirmed them in his planner. I plan to see Brent [name] in one week for med rec.   Refills: Lasix (OUT OF SAME) Seroquel Isosorbide 39m Metoprolol (none in #1 box for next week) Solfenacin (none in #1 box for next week) Mirtazapine    2/14 HF Pharmacy at 2:15      Future Appointments  Date Time Provider DJane Lew 04/03/2020  7:00 AM [facility] DEVICE REMOTES [facility]  04/09/2020  2:00 PM [facility] PHARMACY [facility] None  04/12/2020  2:15 PM TEvans Lance MD [facility]  04/20/2020  9:00 AM [facility] RM 1 [facility] None  05/18/2020 12:00 PM MLarey Dresser MD [facility] None  07/03/2020  7:00 AM [facility]  DEVICE REMOTES [facility]  10/02/2020  7:00 AM [facility] DEVICE REMOTES [facility]  01/01/2021  7:00 AM [facility] DEVICE REMOTES [facility]  01/29/2021  1:00 PM [name], Dinah C, NP [facility] None  04/02/2021  7:00 AM [facility] DEVICE REMOTES [facility]  07/02/2021  7:00 AM [facility] DEVICE REMOTES [facility]     ACTION: Home visit completed Next visit planned for one week

## 2020-03-28 NOTE — Progress Notes (Signed)
Brent [name]  EMT send provider message requesting patient's Remeron script to be send to [place].script e-scribed as requested.

## 2020-04-03 ENCOUNTER — Telehealth: Payer: Self-pay

## 2020-04-03 ENCOUNTER — Ambulatory Visit (INDEPENDENT_AMBULATORY_CARE_PROVIDER_SITE_OTHER): Payer: Medicare Other

## 2020-04-03 DIAGNOSIS — I5022 Chronic systolic (congestive) heart failure: Secondary | ICD-10-CM | POA: Diagnosis not present

## 2020-04-03 LAB — CUP PACEART REMOTE DEVICE CHECK
Battery Remaining Longevity: 135 mo
Battery Voltage: 3.13 V
Brady Statistic RV Percent Paced: 0 %
Date Time Interrogation Session: 20220208022604
HighPow Impedance: 53 Ohm
Implantable Lead Implant Date: 20211108
Implantable Lead Location: 753860
Implantable Pulse Generator Implant Date: 20211108
Lead Channel Impedance Value: 285 Ohm
Lead Channel Impedance Value: 399 Ohm
Lead Channel Pacing Threshold Amplitude: 1 V
Lead Channel Pacing Threshold Pulse Width: 0.4 ms
Lead Channel Sensing Intrinsic Amplitude: 15.75 mV
Lead Channel Sensing Intrinsic Amplitude: 15.75 mV
Lead Channel Setting Pacing Amplitude: 2 V
Lead Channel Setting Pacing Pulse Width: 0.4 ms
Lead Channel Setting Sensing Sensitivity: 0.3 mV

## 2020-04-03 NOTE — Telephone Encounter (Signed)
30 NSVT logged, 1 AF episode all occurring at same time on 03/21/20 lasting 16.5hrs with AF rate histogram showing rates primarily > 120bpm, approx 25% > 160bpm and bursts 190-200bpm (logged as short NSVT).   Pt does not have documented history of AF on record.  No [place] on record.    Presenting Rhythm is regular VS rhythm.    Spoke with pt, he was not aware of any symptoms in the last couple of weeks.  He also denies ever being told he had AF.    Pt scheduled for 90 post implant check with Dr. Lovena Le on 04/12/20.

## 2020-04-05 NOTE — Telephone Encounter (Signed)
Please start eliquis 5 mg bid and followup as scheduled. GT

## 2020-04-06 NOTE — Telephone Encounter (Signed)
LVM for pt to call back to review MD recommendations.

## 2020-04-09 ENCOUNTER — Other Ambulatory Visit: Payer: Self-pay

## 2020-04-09 ENCOUNTER — Other Ambulatory Visit: Payer: Self-pay | Admitting: Emergency Medicine

## 2020-04-09 ENCOUNTER — Telehealth (HOSPITAL_COMMUNITY): Payer: Self-pay

## 2020-04-09 ENCOUNTER — Ambulatory Visit (HOSPITAL_COMMUNITY)
Admission: RE | Admit: 2020-04-09 | Discharge: 2020-04-09 | Disposition: A | Discharge status: Home / Self Care | Payer: Medicare Other | Source: Ambulatory Visit | Attending: Internal Medicine | Admitting: Internal Medicine

## 2020-04-09 VITALS — BP 110/64 | HR 89 | Wt 176.2 lb

## 2020-04-09 DIAGNOSIS — J449 Chronic obstructive pulmonary disease, unspecified: Secondary | ICD-10-CM | POA: Insufficient documentation

## 2020-04-09 DIAGNOSIS — K900 Celiac disease: Secondary | ICD-10-CM | POA: Insufficient documentation

## 2020-04-09 DIAGNOSIS — K5090 Crohn's disease, unspecified, without complications: Secondary | ICD-10-CM | POA: Diagnosis not present

## 2020-04-09 DIAGNOSIS — I5022 Chronic systolic (congestive) heart failure: Secondary | ICD-10-CM | POA: Diagnosis not present

## 2020-04-09 DIAGNOSIS — Z7901 Long term (current) use of anticoagulants: Secondary | ICD-10-CM | POA: Diagnosis not present

## 2020-04-09 DIAGNOSIS — Z87891 Personal history of nicotine dependence: Secondary | ICD-10-CM | POA: Diagnosis not present

## 2020-04-09 DIAGNOSIS — N183 Chronic kidney disease, stage 3 unspecified: Secondary | ICD-10-CM | POA: Diagnosis not present

## 2020-04-09 DIAGNOSIS — R42 Dizziness and giddiness: Secondary | ICD-10-CM | POA: Insufficient documentation

## 2020-04-09 DIAGNOSIS — Z79899 Other long term (current) drug therapy: Secondary | ICD-10-CM | POA: Diagnosis not present

## 2020-04-09 DIAGNOSIS — R5383 Other fatigue: Secondary | ICD-10-CM | POA: Diagnosis not present

## 2020-04-09 DIAGNOSIS — I130 Hypertensive heart and chronic kidney disease with heart failure and stage 1 through stage 4 chronic kidney disease, or unspecified chronic kidney disease: Secondary | ICD-10-CM | POA: Diagnosis not present

## 2020-04-09 MED ORDER — APIXABAN 5 MG PO TABS: 5.0000 mg | ORAL_TABLET | Freq: Two times a day (BID) | ORAL | 3 refills | Status: DC

## 2020-04-09 NOTE — Telephone Encounter (Addendum)
Spoke with [employer]- notified that patient is to begin Eliquis 5 mg BID due to new onset AF. Patient to discontinue ASA 81 mg daily after starting Eliquis .[facility] that RX sent to confirmed as one on file. Will contact office if any other issues or concern.

## 2020-04-09 NOTE — Patient Instructions (Addendum)
It was a pleasure seeing you today!  MEDICATIONS: -We are changing your medications today - Take furosemide 40 mg (2 tablets) for 2 days then resume furosemide 20 mg (1 tablet) MWF. We will contact paramedicine and let them know about this change.  -Call if you have questions about your medications.  NEXT APPOINTMENT: Return to clinic in 1 month with Dr. [name].  In general, to take care of your heart failure: -Limit your fluid intake to 2 Liters (half-gallon) per day.   -Limit your salt intake to ideally 2-3 grams (2000-3000 mg) per day. -Weigh yourself daily and record, and bring that "weight diary" to your next appointment.  (Weight gain of 2-3 pounds in 1 day typically means fluid weight.) -The medications for your heart are to help your heart and help you live longer.   -Please contact us before stopping any of your heart medications.  Call the clinic at [phone] with questions or to reschedule future appointments.

## 2020-04-09 NOTE — Progress Notes (Signed)
PCP: Gayland Curry, DO Cardiology: Dr. [name]  HPI:   Mattthew Ziomek Hartis a20 y.o.with a history of Crohn's disease, celiac disease,HTN, COPD,priortobacco use, CKD 3, chronic anemia, and bipolar disorder.Diagnosed with systolic HF in 8/50 (echo with EF 30-35%).   Admitted 05/05/18-05/10/18 with SOB and hypoxia. Found to have PNA and required BIPAP and antibiotics. Blood cultures were negative. Echo showed newly reduced EF 30-35%. Cardiology consulted and he had nuclear stress test, which was read as "high risk" due to low EF. Per Dr Sallyanne Kuster, did not think changes were indicative of coronary disease, so cath was not pursued due to this and due to CKD. HF meds optimized. Limited with CKD3. He had one short run of NSVT. Also had AKI on CKD3, but creatinine improved to baseline 1.77 on day of discharge.  He was seen in ED 05/12/18 with SOB after misplacing his nebulizer. BNP was elevated, so he was given 40 mg IV furosemide and discharged with furosemide 20 mg BID. Referred to HF clinic.  He was admitted again in 08/2018 with weakness, orthostatic hypotension, and AKI. Cardiac meds were held. He was noted to be severely deconditioned, and he was discharged to SNF. He remained in SNF x 2 wks and is now home.   Echo was done in 8/20, EF 20-25% with mild LVH and normal RV. He had RHC in 8/20 showing normal filling pressures and good cardiac output (surprisingly).   LHC/RHC in 3/21 showed no significant CAD, low filling pressures, preserved cardiac output.Echo in 9/21 with EF 25-30%. Medtronic ICD placed in 11/21.   He recently returned for followup of CHF with Dr. [name] on 02/27/20.He seemed to be doing well. Weight was down 5 lbs. No dyspnea walking on flat ground. No orthopnea/PND. No chest pain. No lightheadedness. No diarrhea, Crohns seemed stable. Main complaint was knee and shoulder arthritis.  He is followed by paramedicine team. At most recent home visit on 03/15/20, his  weight was up 6 lbs from the week prior, but no signs of fluid build up noted. No LEE. Lungs clear with no noted JVD.  He returned to HF clinic for pharmacist medication titration on 03/19/20. At previous visit with MD on 02/27/20, metoprolol succinate was increased to 25 mg BID. He was doing very well overall. BP was lower in office at 108/72, but he denied dizziness, lightheadedness, or fatigue. No chest pain or palpitations. Breathing was stable - could walk from entrance of HF clinic to patient room without getting SOB. Reported his weight dropped to 176 lbs with loss of appetite, but this had improved and was stable at 179 lbs. He reported taking furosemide 20 mg MWF and had not needed any extra. No LEE, orthopnea, or PND. Adhering to low-salt diet.  On 03/21/20, patient was noted to have 1 episode of Afib and 30 beats of NSVT. Plans noted by Dr. Tanna Furry office to start Eliquis.  Today he returns to HF clinic for pharmacist medication titration. At last visit with CPP, isosorbide mononitrate was increased to 60 mg daily. He is feeling all right today. Patient reports that he has some fatigue at home. He also gets dizzy when he gets up too fast, but he knows to stand up slowly. No chest pain or palpitations. He reports being able to walk from the parking lot to the heart failure clinic with no SOB. Clinic weight is down 3 lbs today, but he reports increased swelling in his lower extremities (1+ bilateral LEE). Continues to take furosemide 20  mg TIW and has not taken any extra doses. He reports worsening loss of appetite and has not eaten anything today, so weight loss may be attributable to diet. He is willing to start drinking Ensure daily.   HF Medications: Metoprolol succinate 25 mg BID Farxiga 10 mg daily Hydralazine 25 mg TID Isosorbide mononitrate 60 mg daily Ivabradine 5 mg BID Furosemide 20 mg TIW (Mon/Wed/Fri)  Has the patient been experiencing any side effects to the medications  prescribed?  No, patient is tolerating medications well.   Does the patient have any problems obtaining medications due to transportation or finances?   No, patient has Medicare advantage plan. Patient assistance for [name] and [place] pending for 2022. Of note, his nurse told him he is able to get some of his medications from the [place].  Understanding of regimen: good Understanding of indications: good Potential of compliance: good Patient understands to avoid NSAIDs. Patient understands to avoid decongestants.    Pertinent Lab Values (02/27/20) . Serum creatinine 2.13, BUN 26, Potassium 4.8, Sodium 138   Vital Signs: . Weight: 176.2 lbs (last clinic weight: 179.4 lbs) . Blood pressure: 110/64  . Heart rate: 89  Assessment/Plan: 1. Chronic systolic CHF: Nonischemic cardiomyoapthy. Diagnosed by echo in 3/20, EF 30-35%. LHC in 3/21 with no significant CAD. He has no chest pain. Medication titration has been limited by orthostasis/low BP. Echo in 8/20 showed EF 20-25% with mild LVH and normal RV function. With resting sinus tachycardia was concerned for low output HF, but RHC in 8/20 and again in 3/21 showed normal cardiac output and filling pressures. Echo in 9/21 with EF 25-30%, Medtronic ICD placed.Has had trouble with low BP and orthostasis in the past. Also med titration has been limited by CKD.  - NYHA II (improved), bilateral 1+ pitting edema, slightly worse on right leg - Increase furosemide to 40 mg daily for 2 days, then continue furosemide 20 mg on MWF. - Continue metoprolol succinate 25 mg BID. Dose not increased during today's visit due to patient being being mildly volume up on exam.  - Continue Farxiga 10 mg daily - Continue hydralazine 25 mg TID - Continue isosorbide mononitrate 60 mg daily - Continue ivabradine 5 mg BID - No ACEI/ARB/ARNI with low BP and elevated creatinine.  - No spironolactone for now given elevated creatinine and borderline potassium. - Previously  noted to be a difficult candidate for advanced therapies with deconditioning, renal dysfunction, and lives alone.    2. CKD: Stage 3. Check BMET at next visit.  3. COPD: Mild emphysema on prior chest CT. PFTs in 9/20 showed mild obstruction and mild restriction. High resolution CT chest 10/20 showed no ILD, +chronic bronchitis.  - CTchest completedto follow small lung nodule given smoking history.  Penni Homans, PharmD Candidate  Richardine Service, PharmD, [place] PGY2 Cardiology Pharmacy Resident  Audry Riles, PharmD, BCPS, [facility], CPP Heart Failure Clinic Pharmacist [phone]

## 2020-04-09 NOTE — Progress Notes (Signed)
Remote ICD transmission.   

## 2020-04-09 NOTE — Telephone Encounter (Signed)
Patient has new onset of A-fib. Dr. Lovena Le prescribed Eliquis 5 mg's BID. Patient also takes 81 mg of ASA daily. Routing to Dr. Lovena Le and Sonia Baller RN to see if patient needs to stop the 81 mg of daily ASA.

## 2020-04-09 NOTE — Telephone Encounter (Signed)
Spoke to [name] who understood to pick up his eliquis at [facility] and was made aware of co-pay and that we would work with him on getting patient assistance paperwork filled out. [name] agreed with plan and we reviewed today's noted from pharmacy visit. Call complete, home visit planned for [place].

## 2020-04-09 NOTE — Telephone Encounter (Signed)
Contacted patient about new Brent [name] 5 mg's BID. Dr. Tanna Furry recommendations for new onset of A-fib. Patient states that the (community paramedic) [name] takes care of all his medications. [name] contacted [phone] and a detailed message left advising new onset of A-fib and, that the medication ([name] 5 mg) would be called in to the [employer] at [facility] [employer]. [place]. Patient was notified of the same information and the [facility] number was left for patient and [name]) community paramedic.

## 2020-04-10 NOTE — Telephone Encounter (Signed)
noted 

## 2020-04-11 ENCOUNTER — Other Ambulatory Visit (HOSPITAL_COMMUNITY): Payer: Self-pay

## 2020-04-11 ENCOUNTER — Encounter (HOSPITAL_COMMUNITY): Payer: Self-pay

## 2020-04-11 NOTE — Progress Notes (Signed)
Paramedicine Encounter    Patient ID: Brent [name], male    DOB: [birth year], 75 y.o.   MRN: [id number]   Patient Care Team: Gayland Curry, DO as PCP - General (Geriatric Medicine) Martinique, Peter M, MD as PCP - Cardiology (Cardiology) Larey Dresser, MD as PCP - Advanced Heart Failure (Cardiology) Elmarie Shiley, MD (Nephrology) Milus Banister, MD (Gastroenterology)  Patient Active Problem List   Diagnosis Date Noted  . Chronic systolic heart failure ([place]) 01/02/2020  . Decreased appetite 10/08/2019  . Weakness 10/08/2019  . Hyperkalemia 08/26/2019  . Chronic systolic CHF (congestive heart failure) ([place]) 08/26/2019  . Type 2 diabetes mellitus with stage 3 chronic kidney disease ([place]) 08/26/2019  . Chronic obstructive pulmonary disease ([place]) 06/20/2019  . Ventricular tachycardia ([place]) 06/20/2019  . Acute on chronic systolic (congestive) heart failure ([place]) 04/25/2019  . Metabolic acidosis 81/85/6314  . Orthostasis 09/07/2018  . AKI (acute kidney injury) ([place])   . Immunosuppressed status ([place])   . Macrocytic anemia   . Chronic combined systolic and diastolic congestive heart failure ([place]) 06/03/2018  . Candida esophagitis ([place]) 09/22/2017  . History of smoking 30 or more pack years 01/05/2017  . Bipolar 1 disorder ([place])   . BPH (benign prostatic hyperplasia) 03/31/2013  . 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 Essential hypertension, benign 03/31/2013  . Acute renal failure superimposed on stage 3 chronic kidney disease ([place]) 06/04/2012  . 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  acetaminophen (TYLENOL) 160 MG/5ML liquid, Take 325 mg by mouth every 4 (four) hours as needed for fever., Disp: , Rfl:  .  allopurinol (ZYLOPRIM) 100 MG tablet, Take 2 tablets (200 mg total) by mouth daily., Disp: 60 tablet, Rfl: 5 .  apixaban (ELIQUIS) 5 MG TABS tablet, Take 1 tablet (5 mg total) by mouth 2 (two) times daily., Disp: 60 tablet, Rfl:  3 .  Cholecalciferol (VITAMIN D) 125 MCG (5000 UT) CAPS, Take 5,000 Units by mouth daily. , Disp: , Rfl:  .  dapagliflozin propanediol (FARXIGA) 10 MG TABS tablet, Take 1 tablet (10 mg total) by mouth daily before breakfast., Disp: 90 tablet, Rfl: 3 .  divalproex (DEPAKOTE ER) 500 MG 24 hr tablet, Take 1,500 mg by mouth at bedtime. , Disp: , Rfl:  .  epoetin alfa (EPOGEN) 10000 UNIT/ML injection, Inject 10,000 Units into the skin every 30 (thirty) days., Disp: , Rfl:  .  ferrous sulfate 325 (65 FE) MG tablet, Take 325 mg by mouth in the morning and at bedtime., Disp: , Rfl:  .  furosemide (LASIX) 20 MG tablet, Take 1 tablet (20 mg total) by mouth every Monday, Wednesday, and Friday., Disp: 30 tablet, Rfl: 0 .  hydrALAZINE (APRESOLINE) 25 MG tablet, Take 25 mg by mouth 3 (three) times daily., Disp: , Rfl:  .  inFLIXimab (REMICADE) 100 MG injection, Infuse Remicade IV schedule 1 24m/kg every 8 weeks Premedicate with Tylenol 500-6563mby mouth and Benadryl 25-5076my mouth prior to infusion. Last PPD was on 12/2009. , Disp: 1 each, Rfl: 6 .  isosorbide mononitrate (IMDUR) 60 MG 24 hr tablet, Take 1 tablet (60 mg total) by mouth daily., Disp: 90 tablet, Rfl: 3 .  ivabradine (CORLANOR) 5 MG TABS tablet, Take 1 tablet (5 mg total) by mouth 2 (two) times daily with a meal., Disp: 180 tablet, Rfl: 3 .  loperamide (IMODIUM) 2 MG capsule, Take 1 capsule (2 mg total) by mouth 4 (four) times  daily as needed for diarrhea or loose stools., Disp: 12 capsule, Rfl: 0 .  metoprolol succinate (TOPROL-XL) 25 MG 24 hr tablet, Take 1 tablet (25 mg total) by mouth in the morning and at bedtime., Disp: 180 tablet, Rfl: 6 .  mirtazapine (REMERON) 7.5 MG tablet, TAKE 1 TABLET(7.5 MG) BY MOUTH AT BEDTIME, Disp: 30 tablet, Rfl: 3 .  omeprazole (PRILOSEC) 20 MG capsule, Take 20 mg by mouth daily., Disp: , Rfl:  .  prazosin (MINIPRESS) 1 MG capsule, Take 1 mg by mouth at bedtime., Disp: , Rfl:  .  QUEtiapine (SEROQUEL) 100 MG  tablet, Take 100 mg by mouth at bedtime., Disp: , Rfl:  .  solifenacin (VESICARE) 5 MG tablet, Take 5 mg by mouth daily., Disp: , Rfl:  .  tamsulosin (FLOMAX) 0.4 MG CAPS capsule, Take 1 capsule (0.4 mg total) by mouth daily., Disp: 30 capsule, Rfl: 2 .  vitamin B-12 (CYANOCOBALAMIN) 500 MCG tablet, Take 1,000 mcg by mouth daily. , Disp: , Rfl:  Allergies  Allergen Reactions  . Azathioprine Other (See Comments)    REACTION: affected WBC "Almost died"  . Ciprofloxacin Other (See Comments)    Unknown rxn  . Levaquin [Levofloxacin In D5w] Other (See Comments)    Unknown rxn  . Plendil [Felodipine] Other (See Comments)    Unknown rxn     Social History   Socioeconomic History  . Marital status: Divorced    Spouse name: Not on file  . Number of children: 1  . Years of education: 43  . Highest education level: Not on file  Occupational History  . Occupation: retired  . Occupation: Veteran  Tobacco Use  . Smoking status: Former Smoker    Packs/day: 1.00    Years: 49.00    Pack years: 49.00    Types: Cigarettes    Start date: 04/11/1956    Quit date: 04/17/2014    Years since quitting: 5.9  . Smokeless tobacco: Never Used  Vaping Use  . Vaping Use: Never used  Substance and Sexual Activity  . Alcohol use: No    Alcohol/week: 0.0 standard drinks  . Drug use: No  . Sexual activity: Never  Other Topics Concern  . Not on file  Social History Narrative  . Not on file   Social Determinants of Health   Financial Resource Strain: Not on file  Food Insecurity: Not on file  Transportation Needs: Not on file  Physical Activity: Not on file  Stress: Not on file  Social Connections: Not on file  Intimate Partner Violence: Not on file    Physical Exam Vitals reviewed.  Constitutional:      Appearance: He is normal weight.  HENT:     Head: Normocephalic.     Nose: Nose normal. No congestion.     Mouth/Throat:     Mouth: Mucous membranes are moist.     Pharynx: Oropharynx  is clear.  Eyes:     Pupils: Pupils are equal, round, and reactive to light.  Cardiovascular:     Rate and Rhythm: Normal rate and regular rhythm.     Pulses: Normal pulses.     Heart sounds: Normal heart sounds.  Pulmonary:     Effort: Pulmonary effort is normal.     Breath sounds: Normal breath sounds.  Abdominal:     General: Abdomen is flat.     Palpations: Abdomen is soft.  Musculoskeletal:        General: Normal range of motion.  Cervical back: Normal range of motion.     Right lower leg: No edema.     Left lower leg: No edema.  Skin:    General: Skin is warm and dry.     Capillary Refill: Capillary refill takes less than 2 seconds.  Neurological:     General: No focal deficit present.     Mental Status: He is alert. Mental status is at baseline.  Psychiatric:        Mood and Affect: Mood normal.    Arrived for home visit for Zacharey who was alert and oriented reporting he has been feeling good stating he hasn't had any chest pain, shortness of breath or dizziness. Clebert was compliant with medications over the last week. Geremiah was recently seen by Dr. Lovena Le where they discovered a new onset of atrial fibrillation through his device transmission. Dr. Lovena Le stopped his aspirin and started him on 74m BID of Eliquis. Mr. TLovena Lestarted same today and Aspirin was stopped. Patient also started the 456mof Torsemide for today and tomorrow. Then he will resume MWF Torsemide dosing. I reviewed and confirmed pill box and filled it accordingly. Vitals were obtained and are as recorded. No swelling noted. Scale batteries were dead so we will reassess weight next visit. Lung sounds clear with no JVD. I plan to see JaWaken one week. Home visit complete.   Refills: solfenacin Omeprazole Prazosin   BP noted to be high but patient had just taken medications on my arrival.        Future Appointments  Date Time Provider De[place]lin2/17/2022  2:15 PM TaEvans LanceMD  [facility]  04/20/2020  9:00 AM [facility] RM 1 [facility] None  05/18/2020 12:00 PM McLarey DresserMD [facility] None  07/03/2020  7:00 AM [facility] DEVICE REMOTES [facility]  10/02/2020  7:00 AM [facility] DEVICE REMOTES [facility]  01/01/2021  7:00 AM [facility] DEVICE REMOTES [facility]  01/29/2021  1:00 PM [name], Dinah C, NP [facility] None  04/02/2021  7:00 AM [facility] DEVICE REMOTES [facility]  07/02/2021  7:00 AM [facility] DEVICE REMOTES [facility]     ACTION: Home visit completed Next visit planned for one week

## 2020-04-11 NOTE — Progress Notes (Signed)
Records request from [employer]) [facility] received. Records faxed to [phone]. Request sent to be scanned into chart.

## 2020-04-12 ENCOUNTER — Other Ambulatory Visit: Payer: Self-pay

## 2020-04-12 ENCOUNTER — Ambulatory Visit: Payer: Medicare Other | Admitting: Internal Medicine

## 2020-04-12 ENCOUNTER — Other Ambulatory Visit (HOSPITAL_COMMUNITY): Payer: Self-pay | Admitting: *Deleted

## 2020-04-12 VITALS — BP 136/82 | HR 85 | Ht 76.0 in | Wt 173.0 lb

## 2020-04-12 DIAGNOSIS — I5022 Chronic systolic (congestive) heart failure: Secondary | ICD-10-CM

## 2020-04-12 DIAGNOSIS — Z95810 Presence of automatic (implantable) cardiac defibrillator: Secondary | ICD-10-CM

## 2020-04-12 MED ORDER — APIXABAN 5 MG PO TABS: 5.0000 mg | ORAL_TABLET | Freq: Two times a day (BID) | ORAL | 3 refills | Status: DC

## 2020-04-12 NOTE — Progress Notes (Signed)
HPI [name] is returns today for ongoing evaluation of his ICD.  ICD insertion. The patient is a pleasant 75 yo man with chronic renal insufficiency and a non-ischemic CM who has been on maximal medical therapy and has had persistent LV dysfunction, EF 25%. He has not had syncope. He is much improved on medical therapy. He is not an an ACE/ARB due to chronic renal insufficiency. He has been stable after his ICD insertion.  Allergies  Allergen Reactions  . Azathioprine Other (See Comments)    REACTION: affected WBC "Almost died"  . Ciprofloxacin Other (See Comments)    Unknown rxn  . Levaquin [Levofloxacin In D5w] Other (See Comments)    Unknown rxn  . Plendil [Felodipine] Other (See Comments)    Unknown rxn     Current Outpatient Medications  Medication Sig Dispense Refill  . acetaminophen (TYLENOL) 160 MG/5ML liquid Take 325 mg by mouth every 4 (four) hours as needed for fever.    Marland Kitchen allopurinol (ZYLOPRIM) 100 MG tablet Take 2 tablets (200 mg total) by mouth daily. 60 tablet 5  . apixaban (ELIQUIS) 5 MG TABS tablet Take 1 tablet (5 mg total) by mouth 2 (two) times daily. 180 tablet 3  . Cholecalciferol (VITAMIN D) 125 MCG (5000 UT) CAPS Take 5,000 Units by mouth daily.     . dapagliflozin propanediol (FARXIGA) 10 MG TABS tablet Take 1 tablet (10 mg total) by mouth daily before breakfast. 90 tablet 3  . divalproex (DEPAKOTE ER) 500 MG 24 hr tablet Take 1,500 mg by mouth at bedtime.     Marland Kitchen epoetin alfa (EPOGEN) 10000 UNIT/ML injection Inject 10,000 Units into the skin every 30 (thirty) days.    . ferrous sulfate 325 (65 FE) MG tablet Take 325 mg by mouth in the morning and at bedtime.    . furosemide (LASIX) 20 MG tablet Take 1 tablet (20 mg total) by mouth every Monday, Wednesday, and Friday. 30 tablet 0  . hydrALAZINE (APRESOLINE) 25 MG tablet Take 25 mg by mouth 3 (three) times daily.    Marland Kitchen inFLIXimab (REMICADE) 100 MG injection Infuse Remicade IV schedule 1 33m/kg every 8  weeks Premedicate with Tylenol 500-6561mby mouth and Benadryl 25-5084my mouth prior to infusion. Last PPD was on 12/2009.  1 each 6  . isosorbide mononitrate (IMDUR) 60 MG 24 hr tablet Take 1 tablet (60 mg total) by mouth daily. 90 tablet 3  . ivabradine (CORLANOR) 5 MG TABS tablet Take 1 tablet (5 mg total) by mouth 2 (two) times daily with a meal. 180 tablet 3  . loperamide (IMODIUM) 2 MG capsule Take 1 capsule (2 mg total) by mouth 4 (four) times daily as needed for diarrhea or loose stools. 12 capsule 0  . metoprolol succinate (TOPROL-XL) 25 MG 24 hr tablet Take 1 tablet (25 mg total) by mouth in the morning and at bedtime. 180 tablet 6  . mirtazapine (REMERON) 7.5 MG tablet TAKE 1 TABLET(7.5 MG) BY MOUTH AT BEDTIME 30 tablet 3  . omeprazole (PRILOSEC) 20 MG capsule Take 20 mg by mouth daily.    . prazosin (MINIPRESS) 1 MG capsule Take 1 mg by mouth at bedtime.    . QMarland KitchenEtiapine (SEROQUEL) 100 MG tablet Take 100 mg by mouth at bedtime.    . solifenacin (VESICARE) 5 MG tablet Take 5 mg by mouth daily.    . tamsulosin (FLOMAX) 0.4 MG CAPS capsule Take 1 capsule (0.4 mg total) by mouth daily. 30[place]  capsule 2  . vitamin B-12 (CYANOCOBALAMIN) 500 MCG tablet Take 1,000 mcg by mouth daily.      No current facility-administered medications for this visit.     Past Medical History:  Diagnosis Date  . Anemia   . Anxiety   . Benign paroxysmal positional vertigo   . Bipolar I disorder, most recent episode (or current) unspecified   . Celiac disease   . CHF (congestive heart failure) ([place])   . Chronic kidney disease, stage III (moderate) ([facility])   . Crohn's    Remicade q8 weeks  . Depression   . Essential and other specified forms of tremor    medication-induced Parkinson's, now resolved  . Essential hypertension, benign   . Gout 2018  . Heart failure ([place])   . Hypertrophy of prostate without urinary obstruction and other lower urinary tract symptoms (LUTS)   . Impotence of organic origin    . Insomnia with sleep apnea, unspecified   . Iron deficiency anemia, unspecified   . 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 Neuralgia, neuritis, and radiculitis, unspecified   . Other B-complex deficiencies   . Other extrapyramidal disease and abnormal movement disorder   . Postinflammatory pulmonary fibrosis ([place])   . Tobacco use disorder   . Vertigo 2018    ROS:   All systems reviewed and negative except as noted in the HPI.   Past Surgical History:  Procedure Laterality Date  . CATARACT EXTRACTION, BILATERAL Bilateral 09/2018  . CHOLECYSTECTOMY  07-12-2010  . ICD IMPLANT N/A 01/02/2020   Procedure: ICD IMPLANT;  Surgeon: Evans Lance, MD;  Location: [facility];  Service: Cardiovascular;  Laterality: N/A;  . RIGHT HEART CATH N/A 10/12/2018   Procedure: RIGHT HEART CATH;  Surgeon: Jolaine Artist, MD;  Location: [facility];  Service: Cardiovascular;  Laterality: N/A;  . RIGHT/LEFT HEART CATH AND CORONARY ANGIOGRAPHY N/A 04/26/2019   Procedure: RIGHT/LEFT HEART CATH AND CORONARY ANGIOGRAPHY;  Surgeon: Larey Dresser, MD;  Location: [facility];  Service: Cardiovascular;  Laterality: N/A;  . SMALL INTESTINE SURGERY     x 2     Family History  Problem Relation Age of Onset  . Diabetes Mother        maternal grandmother  . Uterine cancer Mother   . Emphysema Father   . Pneumonia Maternal Grandmother   . Colon cancer Neg Hx      Social History   Socioeconomic History  . Marital status: Divorced    Spouse name: Not on file  . Number of children: 1  . Years of education: 75  . Highest education level: Not on file  Occupational History  . Occupation: retired  . Occupation: Veteran  Tobacco Use  . Smoking status: Former Smoker    Packs/day: 1.00    Years: 49.00    Pack years: 49.00    Types: Cigarettes    Start date: 04/11/1956    Quit date: 04/17/2014    Years since quitting: 5.9  . Smokeless tobacco: Never Used  Vaping Use  . Vaping Use: Never  used  Substance and Sexual Activity  . Alcohol use: No    Alcohol/week: 0.0 standard drinks  . Drug use: No  . Sexual activity: Never  Other Topics Concern  . Not on file  Social History Narrative  . Not on file   Social Determinants of Health   Financial Resource Strain: Not on file  Food Insecurity: Not on file  Transportation Needs: Not  on file  Physical Activity: Not on file  Stress: Not on file  Social Connections: Not on file  Intimate Partner Violence: Not on file     BP 136/82   Pulse 85   Ht 6' 4"  (1.93 m)   Wt 173 lb (78.5 kg)   SpO2 95%   BMI 21.06 kg/m   Physical Exam:  Well appearing NAD HEENT: Unremarkable Neck:  6 cm JVD, no thyromegally Lymphatics:  No adenopathy Back:  No CVA tenderness Lungs:  Clear with no wheezes HEART:  Regular rate rhythm, no murmurs, no rubs, no clicks Abd:  soft, positive bowel sounds, no organomegally, no rebound, no guarding Ext:  2 plus pulses, no edema, no cyanosis, no clubbing Skin:  No rashes no nodules Neuro:  CN II through XII intact, motor grossly intact   DEVICE  Normal device function.  See PaceArt for details.   Assess/Plan: 1. Chronic systolic heart failure - He appears to be euvolemic. No change in medical therapy. 2. Chronic renal insuff - he has stage 4 disesase which appears to be stable.  3. ICD - his medtronic single chamber ICD is working normally. We will recheck in several months. 4. HTN - his bp is controlled. We will follow. I asked him to maintain a low sodium diet.  Carleene Overlie Taylor,MD

## 2020-04-12 NOTE — Patient Instructions (Signed)
Medication Instructions:  Your physician recommends that you continue on your current medications as directed. Please refer to the Current Medication list given to you today.  Labwork: None ordered.  Testing/Procedures: None ordered.  Follow-Up: Your physician wants you to follow-up in: one year with Cristopher Peru, MD or one of the following Advanced Practice Providers on your designated Care Team:    Chanetta Marshall, NP  Tommye Standard, PA-C  Legrand Como "Jonni Sanger" [place], [place]  Remote monitoring is used to monitor your ICD from home. This monitoring reduces the number of office visits required to check your device to one time per year. It allows [name] to keep an eye on the functioning of your device to ensure it is working properly. You are scheduled for a device check from home on 07/03/2020. You may send your transmission at any time that day. If you have a wireless device, the transmission will be sent automatically. After your physician reviews your transmission, you will receive a postcard with your next transmission date.  Any Other Special Instructions Will Be Listed Below (If Applicable).  If you need a refill on your cardiac medications before your next appointment, please call your pharmacy.

## 2020-04-16 ENCOUNTER — Encounter: Payer: Self-pay | Admitting: Internal Medicine

## 2020-04-18 ENCOUNTER — Other Ambulatory Visit (HOSPITAL_COMMUNITY): Payer: Self-pay

## 2020-04-18 NOTE — Progress Notes (Signed)
Paramedicine Encounter    Patient ID: Brent [name], male    DOB: Sep 06, 1945, 75 y.o.   MRN: [id number]   Patient Care Team: Gayland Curry, DO as PCP - General (Geriatric Medicine) Martinique, Peter M, MD as PCP - Cardiology (Cardiology) Larey Dresser, MD as PCP - Advanced Heart Failure (Cardiology) Elmarie Shiley, MD (Nephrology) Milus Banister, MD (Gastroenterology)  Patient Active Problem List   Diagnosis Date Noted  . ICD (implantable cardioverter-defibrillator) in place 04/12/2020  . Chronic systolic heart failure ([place]) 01/02/2020  . Decreased appetite 10/08/2019  . Weakness 10/08/2019  . Hyperkalemia 08/26/2019  . Chronic systolic CHF (congestive heart failure) ([place]) 08/26/2019  . Type 2 diabetes mellitus with stage 3 chronic kidney disease ([place]) 08/26/2019  . Chronic obstructive pulmonary disease ([place]) 06/20/2019  . Ventricular tachycardia ([place]) 06/20/2019  . Acute on chronic systolic (congestive) heart failure ([place]) 04/25/2019  . Metabolic acidosis 65/46/5035  . Orthostasis 09/07/2018  . AKI (acute kidney injury) ([place])   . Immunosuppressed status ([place])   . Macrocytic anemia   . Chronic combined systolic and diastolic congestive heart failure ([place]) 06/03/2018  . Candida esophagitis ([place]) 09/22/2017  . History of smoking 30 or more pack years 01/05/2017  . Bipolar 1 disorder ([place])   . BPH (benign prostatic hyperplasia) 03/31/2013  . 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 Essential hypertension, benign 03/31/2013  . Acute renal failure superimposed on stage 3 chronic kidney disease ([place]) 06/04/2012  . 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[place]) 01/23/2010    Current Outpatient Medications:  .  acetaminophen (TYLENOL) 160 MG/5ML liquid, Take 325 mg by mouth every 4 (four) hours as needed for fever., Disp: , Rfl:  .  apixaban (ELIQUIS) 5 MG TABS tablet, Take 1 tablet (5 mg total) by mouth 2 (two) times daily., Disp: 180 tablet, Rfl: 3 .  Cholecalciferol (VITAMIN D) 125 MCG  (5000 UT) CAPS, Take 5,000 Units by mouth daily. , Disp: , Rfl:  .  dapagliflozin propanediol (FARXIGA) 10 MG TABS tablet, Take 1 tablet (10 mg total) by mouth daily before breakfast., Disp: 90 tablet, Rfl: 3 .  divalproex (DEPAKOTE ER) 500 MG 24 hr tablet, Take 1,500 mg by mouth at bedtime. , Disp: , Rfl:  .  epoetin alfa (EPOGEN) 10000 UNIT/ML injection, Inject 10,000 Units into the skin every 30 (thirty) days., Disp: , Rfl:  .  ferrous sulfate 325 (65 FE) MG tablet, Take 325 mg by mouth in the morning and at bedtime., Disp: , Rfl:  .  furosemide (LASIX) 20 MG tablet, Take 1 tablet (20 mg total) by mouth every Monday, Wednesday, and Friday., Disp: 30 tablet, Rfl: 0 .  hydrALAZINE (APRESOLINE) 25 MG tablet, Take 25 mg by mouth 3 (three) times daily., Disp: , Rfl:  .  inFLIXimab (REMICADE) 100 MG injection, Infuse Remicade IV schedule 1 61m/kg every 8 weeks Premedicate with Tylenol 500-6521mby mouth and Benadryl 25-5027my mouth prior to infusion. Last PPD was on 12/2009. , Disp: 1 each, Rfl: 6 .  isosorbide mononitrate (IMDUR) 60 MG 24 hr tablet, Take 1 tablet (60 mg total) by mouth daily., Disp: 90 tablet, Rfl: 3 .  ivabradine (CORLANOR) 5 MG TABS tablet, Take 1 tablet (5 mg total) by mouth 2 (two) times daily with a meal., Disp: 180 tablet, Rfl: 3 .  loperamide (IMODIUM) 2 MG capsule, Take 1 capsule (2 mg total) by mouth 4 (four) times daily as needed for diarrhea or loose stools., Disp: 12 capsule, Rfl: 0 .  metoprolol succinate (TOPROL-XL) 25 MG 24 hr tablet, Take 1 tablet (25 mg total) by mouth in the morning and at bedtime., Disp: 180 tablet, Rfl: 6 .  mirtazapine (REMERON) 7.5 MG tablet, TAKE 1 TABLET(7.5 MG) BY MOUTH AT BEDTIME, Disp: 30 tablet, Rfl: 3 .  omeprazole (PRILOSEC) 20 MG capsule, Take 20 mg by mouth daily., Disp: , Rfl:  .  prazosin (MINIPRESS) 1 MG capsule, Take 1 mg by mouth at bedtime., Disp: , Rfl:  .  QUEtiapine (SEROQUEL) 100 MG tablet, Take 100 mg by mouth at bedtime.,  Disp: , Rfl:  .  solifenacin (VESICARE) 5 MG tablet, Take 5 mg by mouth daily., Disp: , Rfl:  .  tamsulosin (FLOMAX) 0.4 MG CAPS capsule, Take 1 capsule (0.4 mg total) by mouth daily., Disp: 30 capsule, Rfl: 2 .  vitamin B-12 (CYANOCOBALAMIN) 500 MCG tablet, Take 1,000 mcg by mouth daily. , Disp: , Rfl:  .  allopurinol (ZYLOPRIM) 100 MG tablet, Take 2 tablets (200 mg total) by mouth daily. (Patient not taking: Reported on 04/18/2020), Disp: 60 tablet, Rfl: 5 Allergies  Allergen Reactions  . Azathioprine Other (See Comments)    REACTION: affected WBC "Almost died"  . Ciprofloxacin Other (See Comments)    Unknown rxn  . Levaquin [Levofloxacin In D5w] Other (See Comments)    Unknown rxn  . Plendil [Felodipine] Other (See Comments)    Unknown rxn      Social History   Socioeconomic History  . Marital status: Divorced    Spouse name: Not on file  . Number of children: 1  . Years of education: 83  . Highest education level: Not on file  Occupational History  . Occupation: retired  . Occupation: Veteran  Tobacco Use  . Smoking status: Former Smoker    Packs/day: 1.00    Years: 49.00    Pack years: 49.00    Types: Cigarettes    Start date: 04/11/1956    Quit date: 04/17/2014    Years since quitting: 6.0  . Smokeless tobacco: Never Used  Vaping Use  . Vaping Use: Never used  Substance and Sexual Activity  . Alcohol use: No    Alcohol/week: 0.0 standard drinks  . Drug use: No  . Sexual activity: Never  Other Topics Concern  . Not on file  Social History Narrative  . Not on file   Social Determinants of Health   Financial Resource Strain: Not on file  Food Insecurity: Not on file  Transportation Needs: Not on file  Physical Activity: Not on file  Stress: Not on file  Social Connections: Not on file  Intimate Partner Violence: Not on file    Physical Exam      Future Appointments  Date Time Provider [place]  04/20/2020  9:00 AM [facility] RM 1 [facility]  None  05/18/2020 12:00 PM Larey Dresser, MD [facility] None  07/03/2020  7:00 AM [facility] DEVICE REMOTES [facility]  10/02/2020  7:00 AM [facility] DEVICE REMOTES [facility]  01/01/2021  7:00 AM [facility] DEVICE REMOTES [facility]  01/29/2021  1:00 PM [name], Dinah C, NP [facility] None  04/02/2021  7:00 AM [facility] DEVICE REMOTES [facility]  07/02/2021  7:00 AM [facility] DEVICE REMOTES [facility]    BP 124/68   Pulse 75   Resp 18   Wt 179 lb (81.2 kg)   SpO2 95%   BMI 21.79 kg/m   Weight yesterday-?? Last visit weight-179    Pt reports he is doing good,  he denies any complaints, no c/p, dizzy at times but is ok now. He reports he is now taking ensure to help with weight gain.  No missed doses of his meds.  Filled 2 pill boxes for him.  He will call in what Is needed.  --no allopurinol bottle  For pill box #2 he only has loperamide in the AM section.   --needs loperamide, corlanor, omeprazole, quetiapine refilled   Marylouise Stacks, [place] Paramedic  04/18/20

## 2020-04-20 ENCOUNTER — Encounter (HOSPITAL_COMMUNITY): Payer: Medicare Other

## 2020-04-23 ENCOUNTER — Other Ambulatory Visit (HOSPITAL_COMMUNITY): Payer: Self-pay | Admitting: *Deleted

## 2020-04-23 MED ORDER — DAPAGLIFLOZIN PROPANEDIOL 10 MG PO TABS: 10.0000 mg | ORAL_TABLET | Freq: Every day | ORAL | 3 refills | Status: DC

## 2020-04-23 NOTE — Telephone Encounter (Signed)
Advanced Heart Failure Patient Advocate Encounter   Patient was approved to receive Farxiga from [employer]  Patient ID: [id number] Effective dates: 03/22/20 through 02/23/21  Set up delivery for patient, should be getting in the next week. The representative stated that his RX would run out in July, sent [place] ([place]) a message requesting a new script be sent in on his behalf.  Charlann Boxer, CPhT

## 2020-04-25 ENCOUNTER — Other Ambulatory Visit (HOSPITAL_COMMUNITY): Payer: Self-pay

## 2020-04-25 NOTE — Progress Notes (Signed)
Paramedicine Encounter    Patient ID: JIYAAN STEINHAUSER, male    DOB: Jun 28, 1945, 75 y.o.   MRN: [id number]  Arrived for med rec for Mr. Theador Jezewski who reports he has been having some dark urine with some trouble keeping his balance over the last 4 days, this is common when patient has had a UTI in the past. I called patient's PCP and scheduled an appointment with a provider tomorrow afternoon. Patient agreeable to plan  Patient missed his PROCRIT infusion last week so I called and had it rescheduled.     I reviewed medications and placed missing meds from last weeks pill box filled by Katie L. -CP. I also filled second pill box so [name] has two weeks worth of medications. [name] was also given his daily medications. I plan to see [name] in two weeks. Visit complete.   Refills: Omeprazole Seroquel Prazosin Farxiga Corlanor  ACTION: Home visit completed Next visit planned for two weeks

## 2020-04-26 ENCOUNTER — Other Ambulatory Visit: Payer: Self-pay

## 2020-04-26 ENCOUNTER — Ambulatory Visit (INDEPENDENT_AMBULATORY_CARE_PROVIDER_SITE_OTHER): Payer: Medicare Other | Admitting: Family

## 2020-04-26 ENCOUNTER — Encounter: Payer: Self-pay | Admitting: Family

## 2020-04-26 ENCOUNTER — Telehealth: Payer: Self-pay | Admitting: Emergency Medicine

## 2020-04-26 VITALS — BP 148/80 | HR 88 | Temp 97.7°F | Resp 16 | Ht 76.0 in | Wt 176.8 lb

## 2020-04-26 DIAGNOSIS — R399 Unspecified symptoms and signs involving the genitourinary system: Secondary | ICD-10-CM | POA: Diagnosis not present

## 2020-04-26 LAB — POCT URINALYSIS DIPSTICK
Bilirubin, UA: POSITIVE
Blood, UA: NEGATIVE
Glucose, UA: POSITIVE — AB
Nitrite, UA: NEGATIVE
Protein, UA: POSITIVE — AB
Spec Grav, UA: 1.025 (ref 1.010–1.025)
Urobilinogen, UA: NEGATIVE E.U./dL — AB
pH, UA: 6.5 (ref 5.0–8.0)

## 2020-04-26 NOTE — Telephone Encounter (Signed)
Contacted Brent [name], Market researcher) that manages patient's medication. Brent [name] is going to try to obtain a form from the [place] and have it faxed to the [facility] for Brent [name] in reference to the [place] providing Eliquis/ for the patient. Patient currently has enough Eliquis to last for short term period. Device ClinicFax Number [phone] given to Brent [name] and [facility] number [phone] if any questions or concerns.

## 2020-04-26 NOTE — Progress Notes (Signed)
Provider: Dinah [name] FNP-C  Gayland Curry, DO  Patient Care Team: Gayland Curry, DO as PCP - General (Geriatric Medicine) Martinique, Peter M, MD as PCP - Cardiology (Cardiology) Larey Dresser, MD as PCP - Advanced Heart Failure (Cardiology) Elmarie Shiley, MD (Nephrology) Milus Banister, MD (Gastroenterology)  Extended Emergency Contact Information Primary Emergency Contact: [facility] Phone: [phone] Mobile Phone: [phone] Relation: Sister Secondary Emergency Contact: [facility] Mobile Phone: [phone] Relation: Friend  Code Status:  Full Code  Goals of care: Advanced Directive information Advanced Directives 04/26/2020  Does Patient Have a Medical Advance Directive? Yes  Type of Advance Directive Out of facility DNR (pink MOST or yellow form)  Does patient want to make changes to medical advance directive? No - Patient declined  Copy of [place] in Chart? -  Would patient like information on creating a medical advance directive? -  Pre-existing out of facility DNR order (yellow form or pink MOST form) -     Chief Complaint  Patient presents with  . Acute Visit    Possible UTI    HPI:  Pt is a 75 y.o. male seen today for an acute visit for evaluation of possible UTI.States Paramedic came on Wednesday stated his urine was too strong and dark in color.No dysuria,frequency or urgency.No lower back,abdominal pain,nausea or vomiting.He denies any fever or chills    Past Medical History:  Diagnosis Date  . Anemia   . Anxiety   . Benign paroxysmal positional vertigo   . Bipolar I disorder, most recent episode (or current) unspecified   . Celiac disease   . CHF (congestive heart failure) ([place])   . Chronic kidney disease, stage III (moderate) (H[facility])   . Crohn's    Remicade q8 weeks  . Depression   . Essential and other specified forms of tremor    medication-induced Parkinson's, now resolved  . Essential hypertension,  benign   . Gout 2018  . Heart failure ([place])   . Hypertrophy of prostate without urinary obstruction and other lower urinary tract symptoms (LUTS)   . Impotence of organic origin   . Insomnia with sleep apnea, unspecified   . Iron deficiency anemia, unspecified   . 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 Neuralgia, neuritis, and radiculitis, unspecified   . Other B-complex deficiencies   . Other extrapyramidal disease and abnormal movement disorder   . Postinflammatory pulmonary fibrosis ([place])   . Tobacco use disorder   . Vertigo 2018   Past Surgical History:  Procedure Laterality Date  . CATARACT EXTRACTION, BILATERAL Bilateral 09/2018  . CHOLECYSTECTOMY  07-12-2010  . ICD IMPLANT N/A 01/02/2020   Procedure: ICD IMPLANT;  Surgeon: Evans Lance, MD;  Location: [facility];  Service: Cardiovascular;  Laterality: N/A;  . RIGHT HEART CATH N/A 10/12/2018   Procedure: RIGHT HEART CATH;  Surgeon: Jolaine Artist, MD;  Location: [facility];  Service: Cardiovascular;  Laterality: N/A;  . RIGHT/LEFT HEART CATH AND CORONARY ANGIOGRAPHY N/A 04/26/2019   Procedure: RIGHT/LEFT HEART CATH AND CORONARY ANGIOGRAPHY;  Surgeon: Larey Dresser, MD;  Location: [facility];  Service: Cardiovascular;  Laterality: N/A;  . SMALL INTESTINE SURGERY     x 2    Allergies  Allergen Reactions  . Azathioprine Other (See Comments)    REACTION: affected WBC "Almost died"  . Ciprofloxacin Other (See Comments)    Unknown rxn  . Levaquin [Levofloxacin In D5w] Other (See Comments)    Unknown rxn  .  Plendil [Felodipine] Other (See Comments)    Unknown rxn    Outpatient Encounter Medications as of 04/26/2020  Medication Sig  . acetaminophen (TYLENOL) 160 MG/5ML liquid Take 325 mg by mouth every 4 (four) hours as needed for fever.  Marland Kitchen allopurinol (ZYLOPRIM) 100 MG tablet Take 2 tablets (200 mg total) by mouth daily.  Marland Kitchen apixaban (ELIQUIS) 5 MG TABS tablet Take 1 tablet (5 mg total) by mouth 2 (two) times  daily.  . Cholecalciferol (VITAMIN D) 125 MCG (5000 UT) CAPS Take 5,000 Units by mouth daily.   . dapagliflozin propanediol (FARXIGA) 10 MG TABS tablet Take 1 tablet (10 mg total) by mouth daily before breakfast.  . divalproex (DEPAKOTE ER) 500 MG 24 hr tablet Take 1,500 mg by mouth at bedtime.   Marland Kitchen epoetin alfa (EPOGEN) 10000 UNIT/ML injection Inject 10,000 Units into the skin every 30 (thirty) days.  . ferrous sulfate 325 (65 FE) MG tablet Take 325 mg by mouth in the morning and at bedtime.  . furosemide (LASIX) 20 MG tablet Take 1 tablet (20 mg total) by mouth every Monday, Wednesday, and Friday.  . hydrALAZINE (APRESOLINE) 25 MG tablet Take 25 mg by mouth 3 (three) times daily.  Marland Kitchen inFLIXimab (REMICADE) 100 MG injection Infuse Remicade IV schedule 1 52m/kg every 8 weeks Premedicate with Tylenol 500-6577mby mouth and Benadryl 25-5071my mouth prior to infusion. Last PPD was on 12/2009.   . isosorbide mononitrate (IMDUR) 60 MG 24 hr tablet Take 1 tablet (60 mg total) by mouth daily.  . ivabradine (CORLANOR) 5 MG TABS tablet Take 1 tablet (5 mg total) by mouth 2 (two) times daily with a meal.  . loperamide (IMODIUM) 2 MG capsule Take 1 capsule (2 mg total) by mouth 4 (four) times daily as needed for diarrhea or loose stools.  . metoprolol succinate (TOPROL-XL) 25 MG 24 hr tablet Take 1 tablet (25 mg total) by mouth in the morning and at bedtime.  . mirtazapine (REMERON) 7.5 MG tablet TAKE 1 TABLET(7.5 MG) BY MOUTH AT BEDTIME  . omeprazole (PRILOSEC) 20 MG capsule Take 20 mg by mouth daily.  . prazosin (MINIPRESS) 1 MG capsule Take 1 mg by mouth at bedtime.  . QMarland KitchenEtiapine (SEROQUEL) 100 MG tablet Take 100 mg by mouth at bedtime.  . solifenacin (VESICARE) 5 MG tablet Take 5 mg by mouth daily.  . tamsulosin (FLOMAX) 0.4 MG CAPS capsule Take 1 capsule (0.4 mg total) by mouth daily.  . vitamin B-12 (CYANOCOBALAMIN) 500 MCG tablet Take 1,000 mcg by mouth daily.    No facility-administered encounter  medications on file as of 04/26/2020.    Review of Systems  Constitutional: Negative for chills, fatigue and fever.       Drinks ensure 3 per day   HENT: Negative for congestion, rhinorrhea, sinus pressure, sinus pain, sneezing and sore throat.   Eyes: Negative for discharge, redness and itching.  Respiratory: Negative for cough, chest tightness, shortness of breath and wheezing.   Cardiovascular: Negative for chest pain, palpitations and leg swelling.  Gastrointestinal: Negative for abdominal distention, abdominal pain, constipation, diarrhea, nausea and vomiting.  Endocrine: Negative for cold intolerance, heat intolerance, polydipsia, polyphagia and polyuria.  Genitourinary: Negative for difficulty urinating, dysuria, flank pain, frequency and urgency.  Musculoskeletal: Negative for arthralgias, back pain and gait problem.  Skin: Negative for color change, pallor and rash.  Neurological: Negative for speech difficulty, weakness, light-headedness and headaches.  Psychiatric/Behavioral: Negative for agitation, confusion and sleep disturbance. The patient is not  nervous/anxious.     Immunization History  Administered Date(s) Administered  . Fluad Quad(high Dose 65+) 11/15/2018, 11/17/2019  . H1N1 02/07/2008  . Influenza, High Dose Seasonal PF 11/26/2016, 12/15/2017  . Influenza, Seasonal, Injecte, Preservative Fre 12/12/2008, 11/07/2009, 11/04/2010  . Influenza,inj,Quad PF,6+ Mos 11/13/2014, 01/03/2016  . Influenza-Unspecified 01/11/1997, 12/15/1997, 03/02/2000, 01/02/2003, 12/18/2003, 12/30/2004, 12/10/2005, 12/25/2005, 01/11/2007, 02/07/2008, 12/08/2011, 10/25/2012, 12/16/2012, 12/09/2013, 11/28/2014, 12/31/2015, 11/24/2016, 10/29/2017, 11/24/2017, 12/15/2017, 11/25/2018  . Moderna Sars-Covid-2 Vaccination 04/20/2019, 05/18/2019, 10/25/2019  . PPD Test 12/30/2010, 01/05/2012, 01/07/2013, 01/13/2014  . Pneumococcal Conjugate-13 01/03/2014, 08/10/2014  . Pneumococcal Polysaccharide-23  03/02/2000, 03/05/2004, 03/27/2005, 01/09/2015, 01/03/2016, 05/06/2018  . Pneumococcal-Unspecified 12/25/2005  . Tdap 05/12/2011, 05/26/2011  . Zoster Recombinat (Shingrix) 05/13/2018, 04/01/2019   Pertinent  Health Maintenance Due  Topic Date Due  . HEMOGLOBIN A1C  02/27/2020  . FOOT EXAM  08/09/2020  . OPHTHALMOLOGY EXAM  10/12/2020  . COLONOSCOPY (Pts 45-76yr Insurance coverage will need to be confirmed)  09/17/2027  . INFLUENZA VACCINE  Completed  . PNA vac Low Risk Adult  Completed   Fall Risk  04/26/2020 02/02/2020 01/24/2020 12/16/2019 12/05/2019  Falls in the past year? 0 0 0 - 0  Number falls in past yr: 0 0 0 1 0  Injury with Fall? 0 0 0 0 0  Risk for fall due to : - - - - -  Risk for fall due to: Comment - - - - -  Follow up - - - - -   Functional Status Survey:    Vitals:   04/26/20 1445  BP: (!) 148/80  Pulse: 88  Resp: 16  Temp: 97.7 F (36.5 C)  SpO2: 96%  Weight: 176 lb 12.8 oz (80.2 kg)  Height: 6' 4"  (1.93 m)   Body mass index is 21.52 kg/m. Physical Exam Vitals reviewed.  Constitutional:      General: He is not in acute distress.    Appearance: He is normal weight. He is not ill-appearing.  HENT:     Head: Normocephalic.  Cardiovascular:     Rate and Rhythm: Normal rate and regular rhythm.     Pulses: Normal pulses.     Heart sounds: Normal heart sounds. No murmur heard. No friction rub. No gallop.   Pulmonary:     Effort: Pulmonary effort is normal. No respiratory distress.     Breath sounds: Normal breath sounds. No wheezing, rhonchi or rales.  Chest:     Chest wall: No tenderness.  Abdominal:     General: Bowel sounds are normal. There is no distension.     Palpations: Abdomen is soft. There is no mass.     Tenderness: There is no abdominal tenderness. There is no right CVA tenderness, left CVA tenderness, guarding or rebound.  Neurological:     Mental Status: He is alert and oriented to person, place, and time.     Gait: Gait abnormal.   Psychiatric:        Mood and Affect: Mood normal.        Speech: Speech normal.        Behavior: Behavior normal.        Thought Content: Thought content normal.        Judgment: Judgment normal.    Labs reviewed: Recent Labs    08/27/19 0201 08/28/19 0323 09/06/19 1559 09/26/19 1416 12/08/19 1350 12/30/19 1025 02/27/20 1420  NA 140 138 141   < > 141 140 138  K 5.5* 5.3* 5.3   < > 5.2* 4.3  4.8  CL 107 103 111*   < > 106 106 105  CO2 25 27 22    < > 25 19* 25  GLUCOSE 127* 97 83   < > 91 112* 103*  BUN 36* 34* 31*   < > 38* 31* 26*  CREATININE 2.52* 2.57* 2.16*   < > 2.38* 2.62* 2.13*  CALCIUM 8.4* 8.6* 8.8   < > 9.1 8.9 8.6*  MG 1.8 1.6* 1.8  --   --   --   --   PHOS 4.4 3.9  --   --   --   --   --    < > = values in this interval not displayed.   Recent Labs    08/27/19 0201 08/28/19 0323 09/06/19 1559 09/28/19 0946 10/20/19 1158  AST 11* 11* 11 8* 11  ALT 9 10 7* 5* 8*  ALKPHOS 56 57  --   --   --   BILITOT 0.4 0.4 0.4 0.3 0.3  PROT 5.6* 6.2* 6.4 6.7 6.7  ALBUMIN 2.8* 3.0*  --   --   --    Recent Labs    09/28/19 0946 10/17/19 1043 10/20/19 1158 11/17/19 1020 12/30/19 1025 01/16/20 1015 02/15/20 1025 03/20/20 0824  WBC 10.7  --  8.1  --  10.7  --   --   --   NEUTROABS 7,586  --  4,820  --  6.1  --   --   --   HGB 11.3*   < > 11.7*   < > 12.2* 11.0* 10.8* 11.3*  HCT 34.3*   < > 36.5*   < > 37.1* 35.2* 35.8* 36.5*  MCV 100.6*  --  102.2*  --  101*  --   --   --   PLT 148  --  136*  --  177  --   --   --    < > = values in this interval not displayed.   Lab Results  Component Value Date   TSH 2.46 10/20/2019   Lab Results  Component Value Date   HGBA1C 5.1 08/27/2019   Lab Results  Component Value Date   CHOL 161 10/20/2019   HDL 54 10/20/2019   LDLCALC 86 10/20/2019   TRIG 112 10/20/2019   CHOLHDL 3.0 10/20/2019    Significant Diagnostic Results in last 30 days:  CUP PACEART REMOTE DEVICE CHECK  Result Date: 04/03/2020 Scheduled remote  reviewed. Normal device function.  30 NSVT logged, 1 AF episode all occurring at same time on 03/21/20 lasting 16.5hrs with AF rate histogram showing rates primarily > 120bpm, approx 25% > 160bpm and bursts 190-200bpm (logged as short NSVT). Routing for further review. Next remote 91 days- JBox, RN/CVRS   Assessment/Plan   Symptoms of urinary tract infection Afebrile.reports dark strong colored urine at home noted by EMS was advised to follow up with PCP.Negative exam findings. - POC Urinalysis Dipstick indicates small leukocytes ,positive protein large bilirubin,250 of Glucose and small ketones but negative for blood and Nitrites. -suspect possible not drinking enough water drinks 2 glasses per day states told by heart specialist due to CHF.He will verify his fluid intake amount then adjusted. Will send urine for urine culture.Made aware culture results will be back in 3 days. Advised to notify provider if running any fever,chills or symptoms worsen. - Culture, Urine  Family/ staff Communication: Reviewed plan of care with patient verbalized understanding.  Labs/tests ordered: None   Next Appointment:   [name]  [name], NP

## 2020-04-26 NOTE — Patient Instructions (Signed)
-   Urine culture send to lab will call you in 3 days with results - Notify provider if symptoms worsen or running any fever or chills.

## 2020-04-27 ENCOUNTER — Non-Acute Institutional Stay (HOSPITAL_COMMUNITY)
Admission: RE | Admit: 2020-04-27 | Discharge: 2020-04-27 | Disposition: A | Discharge status: Home / Self Care | Payer: Medicare Other | Source: Ambulatory Visit | Attending: Internal Medicine | Admitting: Internal Medicine

## 2020-04-27 DIAGNOSIS — N189 Chronic kidney disease, unspecified: Secondary | ICD-10-CM | POA: Insufficient documentation

## 2020-04-27 DIAGNOSIS — D631 Anemia in chronic kidney disease: Secondary | ICD-10-CM | POA: Diagnosis not present

## 2020-04-27 LAB — URINE CULTURE
MICRO NUMBER:: 11604061
SPECIMEN QUALITY:: ADEQUATE

## 2020-04-27 LAB — IRON AND TIBC
Iron: 59 ug/dL (ref 45–182)
Saturation Ratios: 28 % (ref 17.9–39.5)
TIBC: 214 ug/dL — ABNORMAL LOW (ref 250–450)
UIBC: 155 ug/dL

## 2020-04-27 LAB — HEMOGLOBIN AND HEMATOCRIT, BLOOD
HCT: 37.1 % — ABNORMAL LOW (ref 39.0–52.0)
Hemoglobin: 11.7 g/dL — ABNORMAL LOW (ref 13.0–17.0)

## 2020-04-27 LAB — FERRITIN: Ferritin: 786 ng/mL — ABNORMAL HIGH (ref 24–336)

## 2020-04-27 MED ORDER — EPOETIN ALFA 10000 UNIT/ML IJ SOLN
10000.0000 [IU] | INTRAMUSCULAR | Status: DC
  Administered 2020-04-27: 10000 [IU] via SUBCUTANEOUS
  Filled 2020-04-27: qty 1

## 2020-04-27 NOTE — Progress Notes (Signed)
PATIENT CARE CENTER NOTE  Diagnosis:Anemia associated with Chronic Renal Failure, anemia associated with renal disease   Provider:Patel, Ulice Dash MD   Procedure:Epoetin Alfa (Procrit) injection   Note:Patient received sub-qProcritinjection inrightarm. Tolerated well. Labs drawn pre-injection and Hemoglobin was11.7. Patient'sBP elevated but within parameters at 157/103.Per patient, he did not take BP medications this morning. Discharge instructions given. Patient alert, oriented and ambulatoryat discharge.

## 2020-04-27 NOTE — Discharge Instructions (Signed)

## 2020-04-29 ENCOUNTER — Other Ambulatory Visit (HOSPITAL_COMMUNITY): Payer: Self-pay | Admitting: Cardiology

## 2020-04-30 ENCOUNTER — Other Ambulatory Visit (HOSPITAL_COMMUNITY): Payer: Self-pay | Admitting: Cardiology

## 2020-05-02 NOTE — Telephone Encounter (Signed)
Contacted Brent [name].  She is still trying to get paperwork from the [place].  She will contact this nurse when she gets paperwork.  Await further needs

## 2020-05-09 ENCOUNTER — Other Ambulatory Visit (HOSPITAL_COMMUNITY): Payer: Self-pay

## 2020-05-09 NOTE — Progress Notes (Signed)
Paramedicine Encounter    Patient ID: Brent [name], male    DOB: May 06, 1945, 75 y.o.   MRN: [id number]   Patient Care Team: Gayland Curry, DO as PCP - General (Geriatric Medicine) Martinique, Peter M, MD as PCP - Cardiology (Cardiology) Larey Dresser, MD as PCP - Advanced Heart Failure (Cardiology) Elmarie Shiley, MD (Nephrology) Milus Banister, MD (Gastroenterology)  Patient Active Problem List   Diagnosis Date Noted  . ICD (implantable cardioverter-defibrillator) in place 04/12/2020  . Chronic systolic heart failure ([place]) 01/02/2020  . Decreased appetite 10/08/2019  . Weakness 10/08/2019  . Hyperkalemia 08/26/2019  . Chronic systolic CHF (congestive heart failure) ([place]) 08/26/2019  . Type 2 diabetes mellitus with stage 3 chronic kidney disease ([place]) 08/26/2019  . Chronic obstructive pulmonary disease ([place]) 06/20/2019  . Ventricular tachycardia ([place]) 06/20/2019  . Acute on chronic systolic (congestive) heart failure ([place]) 04/25/2019  . Metabolic acidosis 05/15/2246  . Orthostasis 09/07/2018  . AKI (acute kidney injury) ([place])   . Immunosuppressed status ([place])   . Macrocytic anemia   . Chronic combined systolic and diastolic congestive heart failure ([place]) 06/03/2018  . Candida esophagitis ([place]) 09/22/2017  . History of smoking 30 or more pack years 01/05/2017  . Bipolar 1 disorder ([place])   . BPH (benign prostatic hyperplasia) 03/31/2013  . 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 Essential hypertension, benign 03/31/2013  . Acute renal failure superimposed on stage 3 chronic kidney disease ([place]) 06/04/2012  . 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[place]) 01/23/2010    Current Outpatient Medications:  .  acetaminophen (TYLENOL) 160 MG/5ML liquid, Take 325 mg by mouth every 4 (four) hours as needed for fever., Disp: , Rfl:  .  allopurinol (ZYLOPRIM) 100 MG tablet, Take 2 tablets (200 mg total) by mouth daily., Disp: 60 tablet, Rfl: 5 .  apixaban (ELIQUIS) 5 MG TABS tablet, Take 1 tablet  (5 mg total) by mouth 2 (two) times daily., Disp: 180 tablet, Rfl: 3 .  Cholecalciferol (VITAMIN D) 125 MCG (5000 UT) CAPS, Take 5,000 Units by mouth daily. , Disp: , Rfl:  .  CORLANOR 5 MG TABS tablet, TAKE 1 TABLET(5 MG) BY MOUTH TWICE DAILY WITH A MEAL, Disp: 180 tablet, Rfl: 3 .  dapagliflozin propanediol (FARXIGA) 10 MG TABS tablet, Take 1 tablet (10 mg total) by mouth daily before breakfast., Disp: 90 tablet, Rfl: 3 .  divalproex (DEPAKOTE ER) 500 MG 24 hr tablet, Take 1,500 mg by mouth at bedtime. , Disp: , Rfl:  .  epoetin alfa (EPOGEN) 10000 UNIT/ML injection, Inject 10,000 Units into the skin every 30 (thirty) days., Disp: , Rfl:  .  ferrous sulfate 325 (65 FE) MG tablet, Take 325 mg by mouth in the morning and at bedtime., Disp: , Rfl:  .  furosemide (LASIX) 20 MG tablet, Take 1 tablet (20 mg total) by mouth every Monday, Wednesday, and Friday., Disp: 30 tablet, Rfl: 0 .  hydrALAZINE (APRESOLINE) 25 MG tablet, Take 25 mg by mouth 3 (three) times daily., Disp: , Rfl:  .  inFLIXimab (REMICADE) 100 MG injection, Infuse Remicade IV schedule 1 41m/kg every 8 weeks Premedicate with Tylenol 500-6571mby mouth and Benadryl 25-5057my mouth prior to infusion. Last PPD was on 12/2009. , Disp: 1 each, Rfl: 6 .  isosorbide mononitrate (IMDUR) 60 MG 24 hr tablet, Take 1 tablet (60 mg total) by mouth daily., Disp: 90 tablet, Rfl: 3 .  loperamide (IMODIUM) 2 MG capsule, Take 1 capsule (2 mg total) by mouth  4 (four) times daily as needed for diarrhea or loose stools., Disp: 12 capsule, Rfl: 0 .  metoprolol succinate (TOPROL-XL) 25 MG 24 hr tablet, Take 1 tablet (25 mg total) by mouth in the morning and at bedtime., Disp: 180 tablet, Rfl: 6 .  mirtazapine (REMERON) 7.5 MG tablet, TAKE 1 TABLET(7.5 MG) BY MOUTH AT BEDTIME, Disp: 30 tablet, Rfl: 3 .  omeprazole (PRILOSEC) 20 MG capsule, Take 20 mg by mouth daily., Disp: , Rfl:  .  prazosin (MINIPRESS) 1 MG capsule, Take 1 mg by mouth at bedtime., Disp: , Rfl:   .  QUEtiapine (SEROQUEL) 100 MG tablet, Take 100 mg by mouth at bedtime., Disp: , Rfl:  .  solifenacin (VESICARE) 5 MG tablet, Take 5 mg by mouth daily., Disp: , Rfl:  .  tamsulosin (FLOMAX) 0.4 MG CAPS capsule, Take 1 capsule (0.4 mg total) by mouth daily., Disp: 30 capsule, Rfl: 2 .  vitamin B-12 (CYANOCOBALAMIN) 500 MCG tablet, Take 1,000 mcg by mouth daily. , Disp: , Rfl:  Allergies  Allergen Reactions  . Azathioprine Other (See Comments)    REACTION: affected WBC "Almost died"  . Ciprofloxacin Other (See Comments)    Unknown rxn  . Levaquin [Levofloxacin In D5w] Other (See Comments)    Unknown rxn  . Plendil [Felodipine] Other (See Comments)    Unknown rxn     Social History   Socioeconomic History  . Marital status: Divorced    Spouse name: Not on file  . Number of children: 1  . Years of education: 25  . Highest education level: Not on file  Occupational History  . Occupation: retired  . Occupation: Veteran  Tobacco Use  . Smoking status: Former Smoker    Packs/day: 1.00    Years: 49.00    Pack years: 49.00    Types: Cigarettes    Start date: 04/11/1956    Quit date: 04/17/2014    Years since quitting: 6.0  . Smokeless tobacco: Never Used  Vaping Use  . Vaping Use: Never used  Substance and Sexual Activity  . Alcohol use: No    Alcohol/week: 0.0 standard drinks  . Drug use: No  . Sexual activity: Never  Other Topics Concern  . Not on file  Social History Narrative  . Not on file   Social Determinants of Health   Financial Resource Strain: Not on file  Food Insecurity: Not on file  Transportation Needs: Not on file  Physical Activity: Not on file  Stress: Not on file  Social Connections: Not on file  Intimate Partner Violence: Not on file    Physical Exam Vitals reviewed.  Constitutional:      Appearance: He is normal weight. He is not toxic-appearing.  HENT:     Head: Normocephalic.     Nose: Nose normal.     Mouth/Throat:     Mouth: Mucous  membranes are moist.     Pharynx: Oropharynx is clear.  Eyes:     Conjunctiva/sclera: Conjunctivae normal.     Pupils: Pupils are equal, round, and reactive to light.  Cardiovascular:     Rate and Rhythm: Normal rate and regular rhythm.     Pulses: Normal pulses.     Heart sounds: Normal heart sounds.  Pulmonary:     Effort: Pulmonary effort is normal.     Breath sounds: Normal breath sounds.  Abdominal:     General: Abdomen is flat.     Palpations: Abdomen is soft.  Musculoskeletal:  General: Normal range of motion.     Cervical back: Normal range of motion.     Right lower leg: No edema.     Left lower leg: No edema.  Skin:    General: Skin is warm and dry.     Capillary Refill: Capillary refill takes less than 2 seconds.  Neurological:     General: No focal deficit present.     Mental Status: He is alert. Mental status is at baseline.  Psychiatric:        Mood and Affect: Mood normal.    Arrived for home visit for Alister who was alert and oriented reporting to be feeling really good today. Jarrel states he has been doing good and feeling a lot better. Ante was compliant with his medications over the last two weeks. I reviewed medications and filled two pill boxes. Fernando had no complaints of shortness of breath, dizziness, chest pain or trouble sleeping or taking medications. I reviewed appointments with Jeneen Rinks and he had confirmed same in his planner. We will meet in clinic with Dr. [name] next Friday and he follows up with [facility] on 3/31 at 1:45. Johanna agreed to visit next week in clinic. Home visit complete.     Refills: Seroquel Remeron Eliquis Corlanor   Future Appointments  Date Time Provider [place]  05/18/2020 12:00 PM Larey Dresser, MD [facility] None  05/25/2020 10:00 AM [facility] RM 1 [facility] None  07/03/2020  7:00 AM [facility] DEVICE REMOTES [facility]  10/02/2020  7:00 AM [facility] DEVICE REMOTES [facility]   01/01/2021  7:00 AM [facility] DEVICE REMOTES [facility]  01/29/2021  1:00 PM [name], Dinah C, NP [facility] None  04/02/2021  7:00 AM [facility] DEVICE REMOTES [facility]  07/02/2021  7:00 AM [facility] DEVICE REMOTES [facility]     ACTION: Home visit completed Next visit planned for next Friday in clinic

## 2020-05-10 ENCOUNTER — Telehealth (HOSPITAL_COMMUNITY): Payer: Self-pay | Admitting: Licensed Clinical Social Worker

## 2020-05-10 NOTE — Telephone Encounter (Signed)
Community Paramedic informed CSW that pt's Eliquis was underfilled by pharmacy and pt is now almost out of medication and is unable to fill for another week because of insurance.  CSW provided with 1 week of Eliquis samples.  Also provided with [employer] application for Corlanor assistance at paramedic request.  Will continue to follow and assist as needed  Jorge Ny, [facility] Desk#: [phone] Cell#: [phone]

## 2020-05-14 DIAGNOSIS — N1832 Chronic kidney disease, stage 3b: Secondary | ICD-10-CM | POA: Diagnosis not present

## 2020-05-16 ENCOUNTER — Other Ambulatory Visit (HOSPITAL_COMMUNITY): Payer: Self-pay

## 2020-05-16 NOTE — Progress Notes (Signed)
Paramedicine Encounter    Patient ID: Brent [name], male    DOB: 09-02-1945, 75 y.o.   MRN: [id number]   Patient Care Team: Gayland Curry, DO as PCP - General (Geriatric Medicine) Martinique, Peter M, MD as PCP - Cardiology (Cardiology) Larey Dresser, MD as PCP - Advanced Heart Failure (Cardiology) Elmarie Shiley, MD (Nephrology) Milus Banister, MD (Gastroenterology)  Patient Active Problem List   Diagnosis Date Noted  . ICD (implantable cardioverter-defibrillator) in place 04/12/2020  . Chronic systolic heart failure ([place]) 01/02/2020  . Decreased appetite 10/08/2019  . Weakness 10/08/2019  . Hyperkalemia 08/26/2019  . Chronic systolic CHF (congestive heart failure) ([place]) 08/26/2019  . Type 2 diabetes mellitus with stage 3 chronic kidney disease ([place]) 08/26/2019  . Chronic obstructive pulmonary disease ([place]) 06/20/2019  . Ventricular tachycardia ([place]) 06/20/2019  . Acute on chronic systolic (congestive) heart failure ([place]) 04/25/2019  . Metabolic acidosis 40/98/1191  . Orthostasis 09/07/2018  . AKI (acute kidney injury) ([place])   . Immunosuppressed status ([place])   . Macrocytic anemia   . Chronic combined systolic and diastolic congestive heart failure ([place]) 06/03/2018  . Candida esophagitis ([place]) 09/22/2017  . History of smoking 30 or more pack years 01/05/2017  . Bipolar 1 disorder ([place])   . BPH (benign prostatic hyperplasia) 03/31/2013  . 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 Essential hypertension, benign 03/31/2013  . Acute renal failure superimposed on stage 3 chronic kidney disease ([place]) 06/04/2012  . 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[place]) 01/23/2010    Current Outpatient Medications:  .  acetaminophen (TYLENOL) 160 MG/5ML liquid, Take 325 mg by mouth every 4 (four) hours as needed for fever., Disp: , Rfl:  .  allopurinol (ZYLOPRIM) 100 MG tablet, Take 2 tablets (200 mg total) by mouth daily., Disp: 60 tablet, Rfl: 5 .  apixaban (ELIQUIS) 5 MG TABS tablet, Take 1 tablet  (5 mg total) by mouth 2 (two) times daily., Disp: 180 tablet, Rfl: 3 .  budesonide (ENTOCORT EC) 3 MG 24 hr capsule, Take 9 mg by mouth daily., Disp: , Rfl:  .  Cholecalciferol (VITAMIN D) 125 MCG (5000 UT) CAPS, Take 5,000 Units by mouth daily. , Disp: , Rfl:  .  CORLANOR 5 MG TABS tablet, TAKE 1 TABLET(5 MG) BY MOUTH TWICE DAILY WITH A MEAL, Disp: 180 tablet, Rfl: 3 .  dapagliflozin propanediol (FARXIGA) 10 MG TABS tablet, Take 1 tablet (10 mg total) by mouth daily before breakfast., Disp: 90 tablet, Rfl: 3 .  divalproex (DEPAKOTE ER) 500 MG 24 hr tablet, Take 1,500 mg by mouth at bedtime. , Disp: , Rfl:  .  epoetin alfa (EPOGEN) 10000 UNIT/ML injection, Inject 10,000 Units into the skin every 30 (thirty) days., Disp: , Rfl:  .  ferrous sulfate 325 (65 FE) MG tablet, Take 325 mg by mouth in the morning and at bedtime., Disp: , Rfl:  .  furosemide (LASIX) 20 MG tablet, Take 1 tablet (20 mg total) by mouth every Monday, Wednesday, and Friday., Disp: 30 tablet, Rfl: 0 .  hydrALAZINE (APRESOLINE) 25 MG tablet, Take 25 mg by mouth 3 (three) times daily., Disp: , Rfl:  .  inFLIXimab (REMICADE) 100 MG injection, Infuse Remicade IV schedule 1 50m/kg every 8 weeks Premedicate with Tylenol 500-6513mby mouth and Benadryl 25-5073my mouth prior to infusion. Last PPD was on 12/2009. , Disp: 1 each, Rfl: 6 .  isosorbide mononitrate (IMDUR) 60 MG 24 hr tablet, Take 1 tablet (60 mg total) by mouth daily.,  Disp: 90 tablet, Rfl: 3 .  loperamide (IMODIUM) 2 MG capsule, Take 1 capsule (2 mg total) by mouth 4 (four) times daily as needed for diarrhea or loose stools., Disp: 12 capsule, Rfl: 0 .  metoprolol succinate (TOPROL-XL) 25 MG 24 hr tablet, Take 1 tablet (25 mg total) by mouth in the morning and at bedtime., Disp: 180 tablet, Rfl: 6 .  mirtazapine (REMERON) 7.5 MG tablet, TAKE 1 TABLET(7.5 MG) BY MOUTH AT BEDTIME, Disp: 30 tablet, Rfl: 3 .  omeprazole (PRILOSEC) 20 MG capsule, Take 20 mg by mouth daily., Disp: ,  Rfl:  .  prazosin (MINIPRESS) 1 MG capsule, Take 1 mg by mouth at bedtime., Disp: , Rfl:  .  QUEtiapine (SEROQUEL) 100 MG tablet, Take 100 mg by mouth at bedtime., Disp: , Rfl:  .  solifenacin (VESICARE) 5 MG tablet, Take 5 mg by mouth daily., Disp: , Rfl:  .  tamsulosin (FLOMAX) 0.4 MG CAPS capsule, Take 1 capsule (0.4 mg total) by mouth daily., Disp: 30 capsule, Rfl: 2 .  vitamin B-12 (CYANOCOBALAMIN) 500 MCG tablet, Take 1,000 mcg by mouth daily. , Disp: , Rfl:  Allergies  Allergen Reactions  . Azathioprine Other (See Comments)    REACTION: affected WBC "Almost died"  . Ciprofloxacin Other (See Comments)    Unknown rxn  . Levaquin [Levofloxacin In D5w] Other (See Comments)    Unknown rxn  . Plendil [Felodipine] Other (See Comments)    Unknown rxn     Social History   Socioeconomic History  . Marital status: Divorced    Spouse name: Not on file  . Number of children: 1  . Years of education: 65  . Highest education level: Not on file  Occupational History  . Occupation: retired  . Occupation: Veteran  Tobacco Use  . Smoking status: Former Smoker    Packs/day: 1.00    Years: 49.00    Pack years: 49.00    Types: Cigarettes    Start date: 04/11/1956    Quit date: 04/17/2014    Years since quitting: 6.0  . Smokeless tobacco: Never Used  Vaping Use  . Vaping Use: Never used  Substance and Sexual Activity  . Alcohol use: No    Alcohol/week: 0.0 standard drinks  . Drug use: No  . Sexual activity: Never  Other Topics Concern  . Not on file  Social History Narrative  . Not on file   Social Determinants of Health   Financial Resource Strain: Not on file  Food Insecurity: Not on file  Transportation Needs: Not on file  Physical Activity: Not on file  Stress: Not on file  Social Connections: Not on file  Intimate Partner Violence: Not on file    Physical Exam Vitals reviewed.  Constitutional:      Appearance: He is normal weight.  HENT:     Head: Normocephalic.      Nose: Nose normal.     Mouth/Throat:     Mouth: Mucous membranes are moist.     Pharynx: Oropharynx is clear.  Eyes:     Conjunctiva/sclera: Conjunctivae normal.     Pupils: Pupils are equal, round, and reactive to light.  Cardiovascular:     Rate and Rhythm: Normal rate and regular rhythm.     Pulses: Normal pulses.     Heart sounds: Normal heart sounds.  Pulmonary:     Effort: Pulmonary effort is normal.     Breath sounds: Normal breath sounds.  Abdominal:  General: Abdomen is flat.     Palpations: Abdomen is soft.  Musculoskeletal:        General: Normal range of motion.     Cervical back: Normal range of motion.     Right lower leg: No edema.     Left lower leg: No edema.  Skin:    General: Skin is warm and dry.     Capillary Refill: Capillary refill takes less than 2 seconds.  Neurological:     General: No focal deficit present.     Mental Status: He is alert. Mental status is at baseline.  Psychiatric:        Mood and Affect: Mood normal.     Arrived for home visit for Brent [name] who was alert and oriented reporting to be feeling okay just sleepy. Brent [name] denied he had any chest pain, shortness of breath, dizziness. Brent [name] has been compliant with his medications over the last week with no difficulties.   Brent [name]'s vitals were obtained and are as noted. Assessment also as noted in report.  No edema, lung sounds clear.   Brent [name]'s medications were verified and confirmed. Pill box filled and confirmed.   Brent [name] has visit with Dr. [name] on Friday and he was reminded of same and he agreed with visit and will be there. [name] understands that I will not be there but will follow up next week.   [employer] application completed and will be turned in to [facility]. I will see Brent [name] in the home in one week.   Refills: NONE       Future Appointments  Date Time Provider [place]  05/18/2020 12:00 PM Larey Dresser, MD [facility] None  05/25/2020 10:00 AM [facility] RM 1  [facility] None  07/03/2020  7:00 AM [facility] DEVICE REMOTES [facility]  10/02/2020  7:00 AM [facility] DEVICE REMOTES [facility]  01/01/2021  7:00 AM [facility] DEVICE REMOTES [facility]  01/29/2021  1:00 PM [name], Dinah C, NP [facility] None  04/02/2021  7:00 AM [facility] DEVICE REMOTES [facility]  07/02/2021  7:00 AM [facility] DEVICE REMOTES [facility]     ACTION: Home visit completed Next visit planned for one week

## 2020-05-17 ENCOUNTER — Inpatient Hospital Stay (HOSPITAL_COMMUNITY)
Admission: EM | Admit: 2020-05-17 | Discharge: 2020-05-19 | DRG: 641 | Disposition: A | Discharge status: Home / Self Care | Payer: No Typology Code available for payment source | Source: Ambulatory Visit | Attending: Internal Medicine | Admitting: Internal Medicine

## 2020-05-17 ENCOUNTER — Encounter (HOSPITAL_COMMUNITY): Payer: Self-pay | Admitting: Emergency Medicine

## 2020-05-17 ENCOUNTER — Telehealth (HOSPITAL_COMMUNITY): Payer: Self-pay

## 2020-05-17 ENCOUNTER — Telehealth (HOSPITAL_COMMUNITY): Payer: Self-pay | Admitting: Licensed Clinical Social Worker

## 2020-05-17 ENCOUNTER — Other Ambulatory Visit: Payer: Self-pay

## 2020-05-17 DIAGNOSIS — E876 Hypokalemia: Principal | ICD-10-CM | POA: Diagnosis present

## 2020-05-17 DIAGNOSIS — I5042 Chronic combined systolic (congestive) and diastolic (congestive) heart failure: Secondary | ICD-10-CM | POA: Diagnosis present

## 2020-05-17 DIAGNOSIS — D696 Thrombocytopenia, unspecified: Secondary | ICD-10-CM | POA: Diagnosis present

## 2020-05-17 DIAGNOSIS — N184 Chronic kidney disease, stage 4 (severe): Secondary | ICD-10-CM | POA: Diagnosis present

## 2020-05-17 DIAGNOSIS — E538 Deficiency of other specified B group vitamins: Secondary | ICD-10-CM | POA: Diagnosis present

## 2020-05-17 DIAGNOSIS — Z881 Allergy status to other antibiotic agents status: Secondary | ICD-10-CM

## 2020-05-17 DIAGNOSIS — I130 Hypertensive heart and chronic kidney disease with heart failure and stage 1 through stage 4 chronic kidney disease, or unspecified chronic kidney disease: Secondary | ICD-10-CM | POA: Diagnosis present

## 2020-05-17 DIAGNOSIS — Z7901 Long term (current) use of anticoagulants: Secondary | ICD-10-CM

## 2020-05-17 DIAGNOSIS — M109 Gout, unspecified: Secondary | ICD-10-CM | POA: Diagnosis present

## 2020-05-17 DIAGNOSIS — Z20822 Contact with and (suspected) exposure to covid-19: Secondary | ICD-10-CM | POA: Diagnosis present

## 2020-05-17 DIAGNOSIS — D631 Anemia in chronic kidney disease: Secondary | ICD-10-CM | POA: Diagnosis present

## 2020-05-17 DIAGNOSIS — R790 Abnormal level of blood mineral: Secondary | ICD-10-CM

## 2020-05-17 DIAGNOSIS — Z95810 Presence of automatic (implantable) cardiac defibrillator: Secondary | ICD-10-CM

## 2020-05-17 DIAGNOSIS — G4700 Insomnia, unspecified: Secondary | ICD-10-CM | POA: Diagnosis present

## 2020-05-17 DIAGNOSIS — Z888 Allergy status to other drugs, medicaments and biological substances status: Secondary | ICD-10-CM

## 2020-05-17 DIAGNOSIS — D509 Iron deficiency anemia, unspecified: Secondary | ICD-10-CM | POA: Diagnosis present

## 2020-05-17 DIAGNOSIS — F319 Bipolar disorder, unspecified: Secondary | ICD-10-CM | POA: Diagnosis present

## 2020-05-17 DIAGNOSIS — Z825 Family history of asthma and other chronic lower respiratory diseases: Secondary | ICD-10-CM

## 2020-05-17 DIAGNOSIS — Z87891 Personal history of nicotine dependence: Secondary | ICD-10-CM

## 2020-05-17 DIAGNOSIS — E878 Other disorders of electrolyte and fluid balance, not elsewhere classified: Secondary | ICD-10-CM | POA: Diagnosis present

## 2020-05-17 DIAGNOSIS — I10 Essential (primary) hypertension: Secondary | ICD-10-CM | POA: Diagnosis present

## 2020-05-17 DIAGNOSIS — K5090 Crohn's disease, unspecified, without complications: Secondary | ICD-10-CM | POA: Diagnosis present

## 2020-05-17 DIAGNOSIS — Z833 Family history of diabetes mellitus: Secondary | ICD-10-CM

## 2020-05-17 DIAGNOSIS — Z8049 Family history of malignant neoplasm of other genital organs: Secondary | ICD-10-CM

## 2020-05-17 DIAGNOSIS — N400 Enlarged prostate without lower urinary tract symptoms: Secondary | ICD-10-CM | POA: Diagnosis present

## 2020-05-17 DIAGNOSIS — Z79899 Other long term (current) drug therapy: Secondary | ICD-10-CM

## 2020-05-17 LAB — CBC WITH DIFFERENTIAL/PLATELET
Abs Immature Granulocytes: 0.05 10*3/uL (ref 0.00–0.07)
Basophils Absolute: 0 10*3/uL (ref 0.0–0.1)
Basophils Relative: 0 %
Eosinophils Absolute: 0.2 10*3/uL (ref 0.0–0.5)
Eosinophils Relative: 2 %
HCT: 35.5 % — ABNORMAL LOW (ref 39.0–52.0)
Hemoglobin: 11 g/dL — ABNORMAL LOW (ref 13.0–17.0)
Immature Granulocytes: 1 %
Lymphocytes Relative: 29 %
Lymphs Abs: 3.2 10*3/uL (ref 0.7–4.0)
MCH: 33.3 pg (ref 26.0–34.0)
MCHC: 31 g/dL (ref 30.0–36.0)
MCV: 107.6 fL — ABNORMAL HIGH (ref 80.0–100.0)
Monocytes Absolute: 0.7 10*3/uL (ref 0.1–1.0)
Monocytes Relative: 7 %
Neutro Abs: 6.8 10*3/uL (ref 1.7–7.7)
Neutrophils Relative %: 61 %
Platelets: 133 10*3/uL — ABNORMAL LOW (ref 150–400)
RBC: 3.3 MIL/uL — ABNORMAL LOW (ref 4.22–5.81)
RDW: 14.6 % (ref 11.5–15.5)
WBC: 11.1 10*3/uL — ABNORMAL HIGH (ref 4.0–10.5)
nRBC: 0 % (ref 0.0–0.2)

## 2020-05-17 LAB — BASIC METABOLIC PANEL
Anion gap: 8 (ref 5–15)
BUN: 27 mg/dL — ABNORMAL HIGH (ref 8–23)
CO2: 25 mmol/L (ref 22–32)
Calcium: 6.4 mg/dL — CL (ref 8.9–10.3)
Chloride: 108 mmol/L (ref 98–111)
Creatinine, Ser: 2.21 mg/dL — ABNORMAL HIGH (ref 0.61–1.24)
GFR, Estimated: 30 mL/min — ABNORMAL LOW (ref >60)
Glucose, Bld: 84 mg/dL (ref 70–99)
Potassium: 2.7 mmol/L — CL (ref 3.5–5.1)
Sodium: 141 mmol/L (ref 135–145)

## 2020-05-17 LAB — RESP PANEL BY RT-PCR (FLU A&B, COVID) ARPGX2
Influenza A by PCR: NEGATIVE
Influenza B by PCR: NEGATIVE
SARS Coronavirus 2 by RT PCR: NEGATIVE

## 2020-05-17 LAB — MAGNESIUM: Magnesium: 0.6 mg/dL — CL (ref 1.7–2.4)

## 2020-05-17 MED ORDER — METOPROLOL SUCCINATE ER 25 MG PO TB24
25.0000 mg | ORAL_TABLET | Freq: Every day | ORAL | Status: DC
  Administered 2020-05-18 – 2020-05-19 (×2): 25 mg via ORAL
  Filled 2020-05-17 (×2): qty 1

## 2020-05-17 MED ORDER — IVABRADINE HCL 5 MG PO TABS
5.0000 mg | ORAL_TABLET | Freq: Two times a day (BID) | ORAL | Status: DC
  Administered 2020-05-18 – 2020-05-19 (×3): 5 mg via ORAL
  Filled 2020-05-17 (×6): qty 1

## 2020-05-17 MED ORDER — FERROUS SULFATE 325 (65 FE) MG PO TABS
325.0000 mg | ORAL_TABLET | Freq: Two times a day (BID) | ORAL | Status: DC
  Administered 2020-05-18 – 2020-05-19 (×3): 325 mg via ORAL
  Filled 2020-05-17 (×3): qty 1

## 2020-05-17 MED ORDER — ISOSORBIDE MONONITRATE ER 30 MG PO TB24
60.0000 mg | ORAL_TABLET | Freq: Every day | ORAL | Status: DC
  Administered 2020-05-18 – 2020-05-19 (×2): 60 mg via ORAL
  Filled 2020-05-17 (×2): qty 2

## 2020-05-17 MED ORDER — PRAZOSIN HCL 1 MG PO CAPS
1.0000 mg | ORAL_CAPSULE | Freq: Every day | ORAL | Status: DC
  Administered 2020-05-18: 1 mg via ORAL
  Filled 2020-05-17 (×4): qty 1

## 2020-05-17 MED ORDER — HYDRALAZINE HCL 25 MG PO TABS
25.0000 mg | ORAL_TABLET | Freq: Three times a day (TID) | ORAL | Status: DC
  Administered 2020-05-18 – 2020-05-19 (×5): 25 mg via ORAL
  Filled 2020-05-17 (×5): qty 1

## 2020-05-17 MED ORDER — MIRTAZAPINE 7.5 MG PO TABS
7.5000 mg | ORAL_TABLET | Freq: Every day | ORAL | Status: DC
  Administered 2020-05-17 – 2020-05-18 (×2): 7.5 mg via ORAL
  Filled 2020-05-17 (×4): qty 1

## 2020-05-17 MED ORDER — ACETAMINOPHEN 650 MG RE SUPP: 650.0000 mg | Freq: Four times a day (QID) | RECTAL | Status: DC | PRN

## 2020-05-17 MED ORDER — QUETIAPINE FUMARATE 100 MG PO TABS
150.0000 mg | ORAL_TABLET | Freq: Every day | ORAL | Status: DC
  Administered 2020-05-18: 150 mg via ORAL
  Filled 2020-05-17 (×2): qty 1.5
  Filled 2020-05-17: qty 3
  Filled 2020-05-17: qty 1.5

## 2020-05-17 MED ORDER — DARIFENACIN HYDROBROMIDE ER 7.5 MG PO TB24
7.5000 mg | ORAL_TABLET | Freq: Every day | ORAL | Status: DC
  Administered 2020-05-18 – 2020-05-19 (×2): 7.5 mg via ORAL
  Filled 2020-05-17 (×2): qty 1

## 2020-05-17 MED ORDER — ALLOPURINOL 100 MG PO TABS
200.0000 mg | ORAL_TABLET | Freq: Every day | ORAL | Status: DC
  Administered 2020-05-19: 200 mg via ORAL
  Filled 2020-05-17: qty 2

## 2020-05-17 MED ORDER — PANTOPRAZOLE SODIUM 40 MG PO TBEC
40.0000 mg | DELAYED_RELEASE_TABLET | Freq: Every day | ORAL | Status: DC
  Administered 2020-05-18 – 2020-05-19 (×2): 40 mg via ORAL
  Filled 2020-05-17 (×2): qty 1

## 2020-05-17 MED ORDER — VITAMIN B-12 1000 MCG PO TABS
1000.0000 ug | ORAL_TABLET | Freq: Every day | ORAL | Status: DC
  Administered 2020-05-18 – 2020-05-19 (×2): 1000 ug via ORAL
  Filled 2020-05-17 (×2): qty 1

## 2020-05-17 MED ORDER — LOPERAMIDE HCL 2 MG PO CAPS
2.0000 mg | ORAL_CAPSULE | Freq: Four times a day (QID) | ORAL | Status: DC | PRN
  Administered 2020-05-18: 2 mg via ORAL
  Filled 2020-05-17: qty 1

## 2020-05-17 MED ORDER — DIVALPROEX SODIUM ER 500 MG PO TB24
1500.0000 mg | ORAL_TABLET | Freq: Every day | ORAL | Status: DC
  Administered 2020-05-18: 1500 mg via ORAL
  Filled 2020-05-17 (×4): qty 3

## 2020-05-17 MED ORDER — MAGNESIUM SULFATE 2 GM/50ML IV SOLN
2.0000 g | Freq: Once | INTRAVENOUS | Status: AC
  Administered 2020-05-17: 2 g via INTRAVENOUS
  Filled 2020-05-17: qty 50

## 2020-05-17 MED ORDER — ACETAMINOPHEN 325 MG PO TABS: 650.0000 mg | ORAL_TABLET | Freq: Four times a day (QID) | ORAL | Status: DC | PRN

## 2020-05-17 MED ORDER — APIXABAN 5 MG PO TABS
5.0000 mg | ORAL_TABLET | Freq: Two times a day (BID) | ORAL | Status: DC
  Administered 2020-05-17 – 2020-05-19 (×4): 5 mg via ORAL
  Filled 2020-05-17 (×4): qty 1

## 2020-05-17 MED ORDER — BUDESONIDE 3 MG PO CPEP
9.0000 mg | ORAL_CAPSULE | Freq: Every day | ORAL | Status: DC
  Administered 2020-05-19: 9 mg via ORAL
  Filled 2020-05-17: qty 3

## 2020-05-17 MED ORDER — VITAMIN D 25 MCG (1000 UNIT) PO TABS
5000.0000 [IU] | ORAL_TABLET | Freq: Every day | ORAL | Status: DC
  Administered 2020-05-18 – 2020-05-19 (×2): 5000 [IU] via ORAL
  Filled 2020-05-17 (×2): qty 5

## 2020-05-17 MED ORDER — POTASSIUM CHLORIDE 10 MEQ/100ML IV SOLN
10.0000 meq | INTRAVENOUS | Status: AC
Start: 2020-05-17 — End: 2020-05-18
  Administered 2020-05-17 (×3): 10 meq via INTRAVENOUS
  Filled 2020-05-17 (×3): qty 100

## 2020-05-17 MED ORDER — TAMSULOSIN HCL 0.4 MG PO CAPS
0.4000 mg | ORAL_CAPSULE | Freq: Every day | ORAL | Status: DC
  Administered 2020-05-18 – 2020-05-19 (×2): 0.4 mg via ORAL
  Filled 2020-05-17 (×2): qty 1

## 2020-05-17 MED ORDER — CALCIUM GLUCONATE-NACL 1-0.675 GM/50ML-% IV SOLN
1.0000 g | Freq: Once | INTRAVENOUS | Status: AC
  Administered 2020-05-17: 1000 mg via INTRAVENOUS
  Filled 2020-05-17: qty 50

## 2020-05-17 NOTE — ED Provider Notes (Signed)
[facility]ERGENCY DEPARTMENT Provider Note   CSN: [id number] Arrival date & time: 05/17/20  1704     History Chief Complaint  Patient presents with  . Abnormal Lab    Brent [name] is a 75 y.o. male with past medical history significant for CHF, CKD, Crohn's, heart failure s/p ICD, chronic anemia who presents for evaluation of abnormal labs.  Follows with the [facility] hospital for majority of his care.  Went in for his normal checkup earlier today.  Had labs drawn.  Felt he had a low potassium, low magnesium and low calcium.  He has no complaints.  Denies headache, lightheadedness, dizziness, vision changes, chest pain, shortness of breath, abdominal pain, paresthesias or weakness.  States he does have chronic diarrhea.  Denies any melena or bright blood per rectum.  Denies additional aggravating or relieving factors.  History obtained from patient and past medical records.  No interpreter used.  PCP- [place]  HPI     Past Medical History:  Diagnosis Date  . Anemia   . Anxiety   . Benign paroxysmal positional vertigo   . Bipolar I disorder, most recent episode (or current) unspecified   . Celiac disease   . CHF (congestive heart failure) ([place])   . Chronic kidney disease, stage III (moderate) (HCC)   . Crohn's    Remicade q8 weeks  . Depression   . Essential and other specified forms of tremor    medication-induced Parkinson's, now resolved  . Essential hypertension, benign   . Gout 2018  . Heart failure ([place])   . Hypertrophy of prostate without urinary obstruction and other lower urinary tract symptoms (LUTS)   . Impotence of organic origin   . Insomnia with sleep apnea, unspecified   . Iron deficiency anemia, unspecified   . 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 Neuralgia, neuritis, and radiculitis, unspecified   . Other B-complex deficiencies   . Other extrapyramidal disease and abnormal movement disorder   . Postinflammatory pulmonary fibrosis ([place])    . Tobacco use disorder   . Vertigo 2018    Patient Active Problem List   Diagnosis Date Noted  . Electrolyte abnormality 05/17/2020  . ICD (implantable cardioverter-defibrillator) in place 04/12/2020  . Chronic systolic heart failure ([place]) 01/02/2020  . Decreased appetite 10/08/2019  . Weakness 10/08/2019  . Hyperkalemia 08/26/2019  . Chronic systolic CHF (congestive heart failure) ([place]) 08/26/2019  . Type 2 diabetes mellitus with stage 3 chronic kidney disease ([place]) 08/26/2019  . Chronic obstructive pulmonary disease ([place]) 06/20/2019  . Ventricular tachycardia ([place]) 06/20/2019  . Acute on chronic systolic (congestive) heart failure ([place]) 04/25/2019  . Metabolic acidosis 16/60/6301  . Orthostasis 09/07/2018  . AKI (acute kidney injury) ([place])   . Immunosuppressed status ([place])   . Macrocytic anemia   . Chronic combined systolic and diastolic congestive heart failure ([place]) 06/03/2018  . Candida esophagitis ([place]) 09/22/2017  . History of smoking 30 or more pack years 01/05/2017  . Bipolar 1 disorder ([place])   . BPH (benign prostatic hyperplasia) 03/31/2013  . 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 Essential hypertension, benign 03/31/2013  . Acute renal failure superimposed on stage 3 chronic kidney disease ([place]) 06/04/2012  . 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[place]) 01/23/2010    Past Surgical History:  Procedure Laterality Date  . CATARACT EXTRACTION, BILATERAL Bilateral 09/2018  . CHOLECYSTECTOMY  07-12-2010  . ICD IMPLANT N/A 01/02/2020   Procedure: ICD IMPLANT;  Surgeon: Evans Lance, MD;  Location: [facility]  INVASIVE CV LAB;  Service: Cardiovascular;  Laterality: N/A;  . RIGHT HEART CATH N/A 10/12/2018   Procedure: RIGHT HEART CATH;  Surgeon: Jolaine Artist, MD;  Location: [facility] CV LAB;  Service: Cardiovascular;  Laterality: N/A;  . RIGHT/LEFT HEART CATH AND CORONARY ANGIOGRAPHY N/A 04/26/2019   Procedure: RIGHT/LEFT HEART CATH AND CORONARY ANGIOGRAPHY;  Surgeon: Larey Dresser, MD;  [facility];  Service: Cardiovascular;  Laterality: N/A;  . SMALL INTESTINE SURGERY     x 2       Family History  Problem Relation Age of Onset  . Diabetes Mother        maternal grandmother  . Uterine cancer Mother   . Emphysema Father   . Pneumonia Maternal Grandmother   . Colon cancer Neg Hx     Social History   Tobacco Use  . Smoking status: Former Smoker    Packs/day: 1.00    Years: 49.00    Pack years: 49.00    Types: Cigarettes    Start date: 04/11/1956    Quit date: 04/17/2014    Years since quitting: 6.0  . Smokeless tobacco: Never Used  Vaping Use  . Vaping Use: Never used  Substance Use Topics  . Alcohol use: No    Alcohol/week: 0.0 standard drinks  . Drug use: No    Home Medications Prior to Admission medications   Medication Sig Start Date End Date Taking? Authorizing Provider  acetaminophen (TYLENOL) 160 MG/5ML liquid Take 325 mg by mouth every 4 (four) hours as needed for fever.    [provider]  allopurinol (ZYLOPRIM) 100 MG tablet Take 2 tablets (200 mg total) by mouth daily. 01/05/19   Reed, Tiffany L, DO  apixaban (ELIQUIS) 5 MG TABS tablet Take 1 tablet (5 mg total) by mouth 2 (two) times daily. 04/12/20 07/11/20  Larey Dresser, MD  budesonide (ENTOCORT EC) 3 MG 24 hr capsule Take 9 mg by mouth daily.    [provider]  Cholecalciferol (VITAMIN D) 125 MCG (5000 UT) CAPS Take 5,000 Units by mouth daily.     [provider]  CORLANOR 5 MG TABS tablet TAKE 1 TABLET(5 MG) BY MOUTH TWICE DAILY WITH A MEAL 05/01/20   Larey Dresser, MD  dapagliflozin propanediol (FARXIGA) 10 MG TABS tablet Take 1 tablet (10 mg total) by mouth daily before breakfast. 04/23/20   Larey Dresser, MD  divalproex (DEPAKOTE ER) 500 MG 24 hr tablet Take 1,500 mg by mouth at bedtime.     [provider]  epoetin alfa (EPOGEN) 10000 UNIT/ML injection Inject 10,000 Units into the skin every 30 (thirty) days.     [provider]  ferrous sulfate 325 (65 FE) MG tablet Take 325 mg by mouth in the morning and at bedtime.    [provider]  furosemide (LASIX) 20 MG tablet Take 1 tablet (20 mg total) by mouth every Monday, Wednesday, and Friday. 03/28/20   Bensimhon, Shaune Pascal, MD  hydrALAZINE (APRESOLINE) 25 MG tablet Take 25 mg by mouth 3 (three) times daily.    [provider]  inFLIXimab (REMICADE) 100 MG injection Infuse Remicade IV schedule 1 4m/kg every 8 weeks Premedicate with Tylenol 500-6579mby mouth and Benadryl 25-5066my mouth prior to infusion. Last PPD was on 12/2009.  11/22/10   JacMilus BanisterD  isosorbide mononitrate (IMDUR) 60 MG 24 hr tablet Take 1 tablet (60 mg total) by mouth daily. 03/28/20   Bensimhon,  Shaune Pascal, MD  loperamide (IMODIUM) 2 MG capsule Take 1 capsule (2 mg total) by mouth 4 (four) times daily as needed for diarrhea or loose stools. 01/18/19   Fawze, Mina A, PA-C  metoprolol succinate (TOPROL-XL) 25 MG 24 hr tablet Take 1 tablet (25 mg total) by mouth in the morning and at bedtime. 03/28/20   Bensimhon, Shaune Pascal, MD  mirtazapine (REMERON) 7.5 MG tablet TAKE 1 TABLET(7.5 MG) BY MOUTH AT BEDTIME 03/28/20   [name], Dinah C, NP  omeprazole (PRILOSEC) 20 MG capsule Take 20 mg by mouth daily.    [provider]  prazosin (MINIPRESS) 1 MG capsule Take 1 mg by mouth at bedtime.    [provider]  QUEtiapine (SEROQUEL) 100 MG tablet Take 100 mg by mouth at bedtime. 01/25/20   [provider]  solifenacin (VESICARE) 5 MG tablet Take 5 mg by mouth daily.    [provider]  tamsulosin (FLOMAX) 0.4 MG CAPS capsule Take 1 capsule (0.4 mg total) by mouth daily. 06/16/19   Reed, Tiffany L, DO  vitamin B-12 (CYANOCOBALAMIN) 500 MCG tablet Take 1,000 mcg by mouth daily.     [provider]    Allergies    Azathioprine, Ciprofloxacin, Levaquin [levofloxacin in d5w], and Plendil [felodipine]  Review of Systems   Review  of Systems  Constitutional: Negative.   HENT: Negative.   Respiratory: Negative.   Cardiovascular: Negative.   Gastrointestinal: Positive for diarrhea (Chronic). Negative for abdominal distention, abdominal pain, anal bleeding, blood in stool, constipation, nausea, rectal pain and vomiting.  Genitourinary: Negative.   Musculoskeletal: Negative.   Skin: Negative.   Neurological: Negative.   All other systems reviewed and are negative.   Physical Exam Updated Vital Signs BP (!) 158/85   Pulse 82   Temp 97.8 F (36.6 C) (Oral)   Resp 16   SpO2 95%   Physical Exam Vitals and nursing note reviewed.  Constitutional:      General: He is not in acute distress.    Appearance: He is well-developed. He is not ill-appearing, toxic-appearing or diaphoretic.  HENT:     Head: Normocephalic and atraumatic.     Nose: Nose normal.     Mouth/Throat:     Mouth: Mucous membranes are moist.  Eyes:     Pupils: Pupils are equal, round, and reactive to light.  Cardiovascular:     Rate and Rhythm: Normal rate and regular rhythm.     Pulses: Normal pulses.     Heart sounds: Normal heart sounds.  Pulmonary:     Effort: Pulmonary effort is normal. No respiratory distress.     Breath sounds: Normal breath sounds.     Comments: Speaks in full sentences without difficulty. Clear bilaterally  Chest:     Comments: Equal rise and fall to chest wall. ICD to left upper chest.  No surrounding erythema or warmth Abdominal:     General: Bowel sounds are normal. There is no distension.     Palpations: Abdomen is soft.     Tenderness: There is no abdominal tenderness. There is no right CVA tenderness, left CVA tenderness or guarding.     Comments: Soft non tender without rebound or guarding  Musculoskeletal:        General: Normal range of motion.     Cervical back: Normal range of motion and neck supple.     Comments: Moves all 4 extremities at difficulty.  No bony tenderness.  Compartments soft.  Lower  extremity  edema  Skin:    General: Skin is warm and dry.     Capillary Refill: Capillary refill takes less than 2 seconds.     Comments: No edema, erythema or warmth  Neurological:     General: No focal deficit present.     Mental Status: He is alert and oriented to person, place, and time.     Cranial Nerves: Cranial nerves are intact.     Sensory: Sensation is intact.     Motor: Motor function is intact.     Comments: Cranial nerves 2- 12 grossly intact     ED Results / Procedures / Treatments   Labs (all labs ordered are listed, but only abnormal results are displayed) Labs Reviewed  CBC WITH DIFFERENTIAL/PLATELET - Abnormal; Notable for the following components:      Result Value   WBC 11.1 (*)    RBC 3.30 (*)    Hemoglobin 11.0 (*)    HCT 35.5 (*)    MCV 107.6 (*)    Platelets 133 (*)    All other components within normal limits  BASIC METABOLIC PANEL - Abnormal; Notable for the following components:   Potassium 2.7 (*)    BUN 27 (*)    Creatinine, Ser 2.21 (*)    Calcium 6.4 (*)    GFR, Estimated 30 (*)    All other components within normal limits  MAGNESIUM - Abnormal; Notable for the following components:   Magnesium 0.6 (*)    All other components within normal limits  RESP PANEL BY RT-PCR (FLU A&B, COVID) [id number]    EKG EKG Interpretation  Date/Time:  Thursday May 17 2020 17:11:43 EDT Ventricular Rate:  97 PR Interval:  140 QRS Duration: 86 QT Interval:  350 QTC Calculation: 444 R Axis:   -14 Text Interpretation: Normal sinus rhythm Left ventricular hypertrophy with repolarization abnormality ( R in aVL , Cornell product ) Abnormal ECG AT normal. Confirmed by Noemi Chapel [phone]) on 05/17/2020 5:33:37 PM  Radiology No results found.  Procedures .Critical Care Performed by: Nettie Elm, PA-C Authorized by: Nettie Elm, PA-C   Critical care provider statement:    Critical care time (minutes):  45   Critical care was necessary to  treat or prevent imminent or life-threatening deterioration of the following conditions:  Metabolic crisis   Critical care was time spent personally by me on the following activities:  Discussions with consultants, evaluation of patient's response to treatment, examination of patient, ordering and performing treatments and interventions, ordering and review of laboratory studies, ordering and review of radiographic studies, pulse oximetry, re-evaluation of patient's condition, obtaining history from patient or surrogate and review of old charts     Medications Ordered in ED Medications  potassium chloride 10 mEq in 100 mL IVPB (10 mEq Intravenous New Bag/Given 05/17/20 2140)  magnesium sulfate IVPB 2 g 50 mL (0 g Intravenous Stopped 05/17/20 2052)  calcium gluconate 1 g/ 50 mL sodium chloride IVPB (0 mg Intravenous Stopped 05/17/20 2142)   ED Course  I have reviewed the triage vital signs and the nursing notes.  Pertinent labs & imaging results that were available during my care of the patient were reviewed by me and considered in my medical decision making (see chart for details).   Here for evaluation of abnormal labs.  Had routine visit PCP earlier today.  He has no complaints.  He is afebrile, nonseptic, not ill-appearing.  His heart and lungs are clear.  Abdomen soft nontender.  He has a nonfocal neuro exam without deficits.  We will plan on labs and reassess  Labs personally reviewed and interpreted:  Leukocytosis 11.0, no infectious symptoms, hemoglobin 11.0 at baseline Mag 0.6 BMP potassium 2.7, calcium 6.4, creatinine 2.21, similar to prior CBC with WBC 11.1, hemoglobin 11.0 EKG no STEMI  Patient reassessed.  His electrolytes are being supplemented via IV however critically low.  Will need admission, repeat labs.  He has no complaints at this time. Patient agreeable for admission.  CONSULT with Dr. Hal Hope with [facility] who agrees to evaluate patient for admission  Discussed with  attending, Dr. Sabra Heck who agrees with above treatment, plan disposition per     MDM Rules/Calculators/A&P                           Final Clinical Impression(s) / ED Diagnoses Final diagnoses:  Hypokalemia  Hypocalcemia  Low magnesium level    Rx / DC Orders ED Discharge Orders    None       Henderly, Britni A, PA-C 05/17/20 2204    Noemi Chapel, MD 05/18/20 1944

## 2020-05-17 NOTE — Telephone Encounter (Signed)
Paramedic requested assistance getting pt a new scale as his old one was issued by insurance and is being picked up by insurance provider on Friday.  CSW able to order a talking scale for pt (needs one as he can't look down without losing balance) and should arrive to his home in the next 1-2 days  Will continue to follow and assist as needed  Jorge Ny, [facility] Desk#: [phone] Cell#: [phone]

## 2020-05-17 NOTE — ED Notes (Signed)
Pt arrived to room 31.

## 2020-05-17 NOTE — ED Notes (Signed)
Pt ambulated to the bathroom without difficulty.  

## 2020-05-17 NOTE — H&P (Signed)
History and Physical    Brent [name] UEK:[id number] DOB: 25-Jul-1945 DOA: 05/17/2020  PCP: Gayland Curry, DO  Patient coming from: Home.  Chief Complaint: Abnormal labs.  HPI: Brent [name] is a 75 y.o. male with history of chronic kidney disease stage IV, chronic systolic heart failure, Crohn's disease, hypertension had routine labs done at outpatient [facility] and was found to be hypokalemic was advised to come to the ER.  Patient denies any new medications recently changed and denies any nausea vomiting or any change in his bowel habits.  Does take loperamide for Crohn's disease but he has not noticed any increased diarrhea.  Patient was started on Fraxiga by cardiologist in January 3 months ago.  ED Course: In the ER patient's labs revealed potassium of 2.7 magnesium 1.6 and calcium of 6.4.  Albumin levels were pending.  EKG shows normal sinus rhythm with QTC of 444 ms.  Patient was given calcium gluconate IV magnesium sulfate and potassium.  Admitted for electrolyte imbalance.  Covid test is negative.  Creatinine is at baseline hemoglobin 11 platelets 133.  Review of Systems: As per HPI, rest all negative.   Past Medical History:  Diagnosis Date  . Anemia   . Anxiety   . Benign paroxysmal positional vertigo   . Bipolar I disorder, most recent episode (or current) unspecified   . Celiac disease   . CHF (congestive heart failure) ([place])   . Chronic kidney disease, stage III (moderate) (HCC)   . Crohn's    Remicade q8 weeks  . Depression   . Essential and other specified forms of tremor    medication-induced Parkinson's, now resolved  . Essential hypertension, benign   . Gout 2018  . Heart failure ([place])   . Hypertrophy of prostate without urinary obstruction and other lower urinary tract symptoms (LUTS)   . Impotence of organic origin   . Insomnia with sleep apnea, unspecified   . Iron deficiency anemia, unspecified   . 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 Neuralgia, neuritis, and  radiculitis, unspecified   . Other B-complex deficiencies   . Other extrapyramidal disease and abnormal movement disorder   . Postinflammatory pulmonary fibrosis ([place])   . Tobacco use disorder   . Vertigo 2018    Past Surgical History:  Procedure Laterality Date  . CATARACT EXTRACTION, BILATERAL Bilateral 09/2018  . CHOLECYSTECTOMY  07-12-2010  . ICD IMPLANT N/A 01/02/2020   Procedure: ICD IMPLANT;  Surgeon: Evans Lance, MD;  Location: [place] [facility];  Service: Cardiovascular;  Laterality: N/A;  . RIGHT HEART CATH N/A 10/12/2018   Procedure: RIGHT HEART CATH;  Surgeon: Jolaine Artist, MD;  Location: [place] [facility];  Service: Cardiovascular;  Laterality: N/A;  . RIGHT/LEFT HEART CATH AND CORONARY ANGIOGRAPHY N/A 04/26/2019   Procedure: RIGHT/LEFT HEART CATH AND CORONARY ANGIOGRAPHY;  Surgeon: Larey Dresser, MD;  Location: [place] [facility];  Service: Cardiovascular;  Laterality: N/A;  . SMALL INTESTINE SURGERY     x 2     reports that he quit smoking about 6 years ago. His smoking use included cigarettes. He started smoking about 64 years ago. He has a 49.00 pack-year smoking history. He has never used smokeless tobacco. He reports that he does not drink alcohol and does not use drugs.  Allergies  Allergen Reactions  . Azathioprine Other (See Comments)    REACTION: affected WBC "Almost died"  . Ciprofloxacin Other (See Comments)    Reaction not recalled  .  Levaquin [Levofloxacin In D5w] Other (See Comments)    Reaction not rec  . Plendil [Felodipine] Other (See Comments)    Reaction not not recalled    Family History  Problem Relation Age of Onset  . Diabetes Mother        maternal grandmother  . Uterine cancer Mother   . Emphysema Father   . Pneumonia Maternal Grandmother   . Colon cancer Neg Hx     Prior to Admission medications   Medication Sig Start Date End Date Taking? Authorizing Provider  acetaminophen (TYLENOL) 160 MG/5ML liquid Take 325 mg by  mouth every 4 (four) hours as needed for fever.    [provider]  allopurinol (ZYLOPRIM) 100 MG tablet Take 2 tablets (200 mg total) by mouth daily. 01/05/19   Reed, Tiffany L, DO  apixaban (ELIQUIS) 5 MG TABS tablet Take 1 tablet (5 mg total) by mouth 2 (two) times daily. 04/12/20 07/11/20  Larey Dresser, MD  budesonide (ENTOCORT EC) 3 MG 24 hr capsule Take 9 mg by mouth daily.    [provider]  Cholecalciferol (VITAMIN D) 125 MCG (5000 UT) CAPS Take 5,000 Units by mouth daily.     [provider]  CORLANOR 5 MG TABS tablet TAKE 1 TABLET(5 MG) BY MOUTH TWICE DAILY WITH A MEAL 05/01/20   Larey Dresser, MD  dapagliflozin propanediol (FARXIGA) 10 MG TABS tablet Take 1 tablet (10 mg total) by mouth daily before breakfast. 04/23/20   Larey Dresser, MD  divalproex (DEPAKOTE ER) 500 MG 24 hr tablet Take 1,500 mg by mouth at bedtime.     [provider]  epoetin alfa (EPOGEN) 10000 UNIT/ML injection Inject 10,000 Units into the skin every 30 (thirty) days.    [provider]  ferrous sulfate 325 (65 FE) MG tablet Take 325 mg by mouth in the morning and at bedtime.    [provider]  furosemide (LASIX) 20 MG tablet Take 1 tablet (20 mg total) by mouth every Monday, Wednesday, and Friday. 03/28/20   Bensimhon, Shaune Pascal, MD  hydrALAZINE (APRESOLINE) 25 MG tablet Take 25 mg by mouth 3 (three) times daily.    [provider]  inFLIXimab (REMICADE) 100 MG injection Infuse Remicade IV schedule 1 49m/kg every 8 weeks Premedicate with Tylenol 500-6573mby mouth and Benadryl 25-5070my mouth prior to infusion. Last PPD was on 12/2009.  11/22/10   JacMilus BanisterD  isosorbide mononitrate (IMDUR) 60 MG 24 hr tablet Take 1 tablet (60 mg total) by mouth daily. 03/28/20   Bensimhon, DanShaune PascalD  loperamide (IMODIUM) 2 MG capsule Take 1 capsule (2 mg total) by mouth 4 (four) times daily as needed for diarrhea or loose stools. 01/18/19   Fawze, Mina A,  PA-C  metoprolol succinate (TOPROL-XL) 25 MG 24 hr tablet Take 1 tablet (25 mg total) by mouth in the morning and at bedtime. 03/28/20   Bensimhon, DanShaune PascalD  mirtazapine (REMERON) 7.5 MG tablet TAKE 1 TABLET(7.5 MG) BY MOUTH AT BEDTIME 03/28/20   [name], Dinah C, NP  omeprazole (PRILOSEC) 20 MG capsule Take 20 mg by mouth daily.    [provider]  prazosin (MINIPRESS) 1 MG capsule Take 1 mg by mouth at bedtime.    [provider]  QUEtiapine (SEROQUEL) 100 MG tablet Take 100 mg by mouth at bedtime. 01/25/20   [provider]  solifenacin (VESICARE) 5 MG tablet Take 5 mg by mouth daily.  [provider]  tamsulosin (FLOMAX) 0.4 MG CAPS capsule Take 1 capsule (0.4 mg total) by mouth daily. 06/16/19   Reed, Tiffany L, DO  vitamin B-12 (CYANOCOBALAMIN) 500 MCG tablet Take 1,000 mcg by mouth daily.     [provider]    Physical Exam: Constitutional: Moderately built and nourished. Vitals:   05/17/20 2100 05/17/20 2144 05/17/20 2231 05/17/20 2232  BP: (!) 158/85  (!) 175/96   Pulse: 82  88 87  Resp: 16  17 16   Temp:  97.8 F (36.6 C)    TempSrc:  Oral    SpO2: 95%  96% 96%   Eyes: Anicteric no pallor. ENMT: No discharge from the ears eyes nose or mouth. Neck: No mass felt.  No neck rigidity. Respiratory: No rhonchi or crepitations. Cardiovascular: S1-S2 heard. Abdomen: Soft nontender bowel sounds present. Musculoskeletal: No edema. Skin: No rash. Neurologic: Alert awake oriented to time place and person.  Moves all extremities. Psychiatric: Appears normal.  Normal affect.   Labs on Admission: I have personally reviewed following labs and imaging studies  CBC: Recent Labs  Lab 05/17/20 1814  WBC 11.1*  NEUTROABS 6.8  HGB 11.0*  HCT 35.5*  MCV 107.6*  PLT 564*   Basic Metabolic Panel: Recent Labs  Lab 05/17/20 1814  NA 141  K 2.7*  CL 108  CO2 25  GLUCOSE 84  BUN 27*  CREATININE 2.21*  CALCIUM 6.4*  MG 0.6*    GFR: Estimated Creatinine Clearance: 31.9 mL/min (A) (by C-G formula based on SCr of 2.21 mg/dL (H)). Liver Function Tests: No results for input(s): AST, ALT, ALKPHOS, BILITOT, PROT, ALBUMIN in the last 168 hours. No results for input(s): LIPASE, AMYLASE in the last 168 hours. No results for input(s): AMMONIA in the last 168 hours. Coagulation Profile: No results for input(s): INR, PROTIME in the last 168 hours. Cardiac Enzymes: No results for input(s): CKTOTAL, CKMB, CKMBINDEX, TROPONINI in the last 168 hours. BNP (last 3 results) No results for input(s): PROBNP in the last 8760 hours. HbA1C: No results for input(s): HGBA1C in the last 72 hours. CBG: No results for input(s): GLUCAP in the last 168 hours. Lipid Profile: No results for input(s): CHOL, HDL, LDLCALC, TRIG, CHOLHDL, LDLDIRECT in the last 72 hours. Thyroid Function Tests: No results for input(s): TSH, T4TOTAL, FREET4, T3FREE, THYROIDAB in the last 72 hours. Anemia Panel: No results for input(s): VITAMINB12, FOLATE, FERRITIN, TIBC, IRON, RETICCTPCT in the last 72 hours. Urine analysis:    Component Value Date/Time   COLORURINE YELLOW 08/26/2019 1944   APPEARANCEUR CLEAR 08/26/2019 1944   LABSPEC 1.012 08/26/2019 1944   PHURINE 7.0 08/26/2019 1944   GLUCOSEU >=500 (A) 08/26/2019 1944   HGBUR NEGATIVE 08/26/2019 1944   BILIRUBINUR Positive 04/26/2020 1504   KETONESUR NEGATIVE 08/26/2019 1944   PROTEINUR Positive (A) 04/26/2020 1504   PROTEINUR NEGATIVE 08/26/2019 1944   UROBILINOGEN negative (A) 04/26/2020 1504   UROBILINOGEN 0.2 07/10/2010 1709   NITRITE Negative 04/26/2020 1504   NITRITE NEGATIVE 08/26/2019 1944   LEUKOCYTESUR Small (1+) (A) 04/26/2020 1504   LEUKOCYTESUR NEGATIVE 08/26/2019 1944   Sepsis Labs: @LABRCNTIP (procalcitonin:4,lacticidven:4) )No results found for this or any previous visit (from the past 240 hour(s)).   Radiological Exams on Admission: No results found.  EKG: Independently  reviewed.  Normal sinus rhythm.  QTC of 444 ms.  Assessment/Plan Principal Problem:   Electrolyte abnormality Active Problems:   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HCC)   BPH (benign prostatic hyperplasia)   Anemia in  chronic kidney disease   Essential hypertension, benign   Bipolar 1 disorder (HCC)   Chronic combined systolic and diastolic congestive heart failure ([place])   ICD (implantable cardioverter-defibrillator) in place    1. Multiple electrolyte derangements with low potassium of 2.7 calcium of 6.4 magnesium of 1.6.  Cause not exactly clear.  Will hold off patient's Lasix at this time and also hold [place].  Albumin levels are pending to calculate the exact calcium levels.  Will check PTH and vitamin D levels.  Patient has been given replacement dose of calcium by calcium gluconate magnesium sulfate IV was ordered also potassium was ordered.  Follow metabolic panel. 2. Chronic systolic heart failure appears compensated holding Lasix due to electrolyte derangements.  Patient in addition takes hydralazine Imdur ivabradine and metoprolol. 3. History of Crohn's takes Entocort and Remicade.  No change in bowel habits recently.  Takes loperamide as needed. 4. Chronic kidney disease stage IV creatinine appears to be at baseline. 5. Patient is on apixaban likely for DVT.  This medication appears to be new.  Note that patient has chronic and disease.  Any further worsening apixaban dose has been adjusted. 6. Anemia and thrombocytopenia appears to be chronic. 7. History of gout on allopurinol.   DVT prophylaxis: Apixaban. Code Status: Full code. Family Communication: Discussed with patient. Disposition Plan: Home. Consults called: None. Admission status: Observation.   Rise Patience MD [facility] Pager [phone].  If 7PM-7AM, please contact night-coverage [email or web address] Password [facility]  05/17/2020, 10:36 PM

## 2020-05-17 NOTE — ED Triage Notes (Signed)
Patient sent to ED from [facility] for abnormal labs, results are available in epic. Potassium 2.8, Calcium 6.5, magnesium 0.7. Patient denies any complaints and is alert, oriented, ambulatory, and in no apparent distress at this time.

## 2020-05-17 NOTE — Telephone Encounter (Signed)
Brent [name] contacted me at [phone] and advised me the [place] in [name] called him and told him he needed to go to the ER due to his Magnesium Levels being dangerously low. [name] couldn't provide me a number for his magnesium level but informed me he was on the way to [facility]. I notified [facility] via VM and will follow up with him next week on Monday.

## 2020-05-17 NOTE — ED Notes (Signed)
Critical K, Mg, and Ca reported to PA [place]

## 2020-05-17 NOTE — ED Notes (Signed)
Called lab to inform them of the orders for the blood that was sent @1800 .

## 2020-05-18 ENCOUNTER — Encounter (HOSPITAL_COMMUNITY): Payer: Medicare Other | Admitting: Cardiology

## 2020-05-18 DIAGNOSIS — E878 Other disorders of electrolyte and fluid balance, not elsewhere classified: Secondary | ICD-10-CM | POA: Diagnosis not present

## 2020-05-18 DIAGNOSIS — Z881 Allergy status to other antibiotic agents status: Secondary | ICD-10-CM | POA: Diagnosis not present

## 2020-05-18 DIAGNOSIS — Z20822 Contact with and (suspected) exposure to covid-19: Secondary | ICD-10-CM | POA: Diagnosis present

## 2020-05-18 DIAGNOSIS — Z87891 Personal history of nicotine dependence: Secondary | ICD-10-CM | POA: Diagnosis not present

## 2020-05-18 DIAGNOSIS — Z8049 Family history of malignant neoplasm of other genital organs: Secondary | ICD-10-CM | POA: Diagnosis not present

## 2020-05-18 DIAGNOSIS — M109 Gout, unspecified: Secondary | ICD-10-CM | POA: Diagnosis present

## 2020-05-18 DIAGNOSIS — F319 Bipolar disorder, unspecified: Secondary | ICD-10-CM | POA: Diagnosis present

## 2020-05-18 DIAGNOSIS — D631 Anemia in chronic kidney disease: Secondary | ICD-10-CM | POA: Diagnosis present

## 2020-05-18 DIAGNOSIS — Z833 Family history of diabetes mellitus: Secondary | ICD-10-CM | POA: Diagnosis not present

## 2020-05-18 DIAGNOSIS — D509 Iron deficiency anemia, unspecified: Secondary | ICD-10-CM | POA: Diagnosis present

## 2020-05-18 DIAGNOSIS — Z79899 Other long term (current) drug therapy: Secondary | ICD-10-CM | POA: Diagnosis not present

## 2020-05-18 DIAGNOSIS — N400 Enlarged prostate without lower urinary tract symptoms: Secondary | ICD-10-CM | POA: Diagnosis present

## 2020-05-18 DIAGNOSIS — E538 Deficiency of other specified B group vitamins: Secondary | ICD-10-CM | POA: Diagnosis present

## 2020-05-18 DIAGNOSIS — D696 Thrombocytopenia, unspecified: Secondary | ICD-10-CM | POA: Diagnosis present

## 2020-05-18 DIAGNOSIS — Z825 Family history of asthma and other chronic lower respiratory diseases: Secondary | ICD-10-CM | POA: Diagnosis not present

## 2020-05-18 DIAGNOSIS — I130 Hypertensive heart and chronic kidney disease with heart failure and stage 1 through stage 4 chronic kidney disease, or unspecified chronic kidney disease: Secondary | ICD-10-CM | POA: Diagnosis present

## 2020-05-18 DIAGNOSIS — Z888 Allergy status to other drugs, medicaments and biological substances status: Secondary | ICD-10-CM | POA: Diagnosis not present

## 2020-05-18 DIAGNOSIS — E876 Hypokalemia: Secondary | ICD-10-CM | POA: Diagnosis present

## 2020-05-18 DIAGNOSIS — Z7901 Long term (current) use of anticoagulants: Secondary | ICD-10-CM | POA: Diagnosis not present

## 2020-05-18 DIAGNOSIS — I5042 Chronic combined systolic (congestive) and diastolic (congestive) heart failure: Secondary | ICD-10-CM | POA: Diagnosis present

## 2020-05-18 DIAGNOSIS — G4700 Insomnia, unspecified: Secondary | ICD-10-CM | POA: Diagnosis present

## 2020-05-18 DIAGNOSIS — N184 Chronic kidney disease, stage 4 (severe): Secondary | ICD-10-CM | POA: Diagnosis present

## 2020-05-18 DIAGNOSIS — Z95810 Presence of automatic (implantable) cardiac defibrillator: Secondary | ICD-10-CM | POA: Diagnosis not present

## 2020-05-18 DIAGNOSIS — K5090 Crohn's disease, unspecified, without complications: Secondary | ICD-10-CM | POA: Diagnosis present

## 2020-05-18 LAB — HEPATIC FUNCTION PANEL
ALT: 10 U/L (ref 0–44)
AST: 11 U/L — ABNORMAL LOW (ref 15–41)
Albumin: 2.3 g/dL — ABNORMAL LOW (ref 3.5–5.0)
Alkaline Phosphatase: 43 U/L (ref 38–126)
Bilirubin, Direct: <0.1 mg/dL (ref 0.0–0.2)
Total Bilirubin: 0.6 mg/dL (ref 0.3–1.2)
Total Protein: 5.9 g/dL — ABNORMAL LOW (ref 6.5–8.1)

## 2020-05-18 LAB — COMPREHENSIVE METABOLIC PANEL
ALT: 9 U/L (ref 0–44)
AST: 11 U/L — ABNORMAL LOW (ref 15–41)
Albumin: 2.3 g/dL — ABNORMAL LOW (ref 3.5–5.0)
Alkaline Phosphatase: 44 U/L (ref 38–126)
Anion gap: 9 (ref 5–15)
BUN: 24 mg/dL — ABNORMAL HIGH (ref 8–23)
CO2: 20 mmol/L — ABNORMAL LOW (ref 22–32)
Calcium: 6.2 mg/dL — CL (ref 8.9–10.3)
Chloride: 112 mmol/L — ABNORMAL HIGH (ref 98–111)
Creatinine, Ser: 1.78 mg/dL — ABNORMAL HIGH (ref 0.61–1.24)
GFR, Estimated: 39 mL/min — ABNORMAL LOW (ref >60)
Glucose, Bld: 74 mg/dL (ref 70–99)
Potassium: 2.8 mmol/L — ABNORMAL LOW (ref 3.5–5.1)
Sodium: 141 mmol/L (ref 135–145)
Total Bilirubin: 0.5 mg/dL (ref 0.3–1.2)
Total Protein: 5.9 g/dL — ABNORMAL LOW (ref 6.5–8.1)

## 2020-05-18 LAB — CBC WITH DIFFERENTIAL/PLATELET
Abs Immature Granulocytes: 0.03 10*3/uL (ref 0.00–0.07)
Basophils Absolute: 0 10*3/uL (ref 0.0–0.1)
Basophils Relative: 0 %
Eosinophils Absolute: 0.3 10*3/uL (ref 0.0–0.5)
Eosinophils Relative: 4 %
HCT: 30.7 % — ABNORMAL LOW (ref 39.0–52.0)
Hemoglobin: 9.8 g/dL — ABNORMAL LOW (ref 13.0–17.0)
Immature Granulocytes: 0 %
Lymphocytes Relative: 29 %
Lymphs Abs: 2.7 10*3/uL (ref 0.7–4.0)
MCH: 33.3 pg (ref 26.0–34.0)
MCHC: 31.9 g/dL (ref 30.0–36.0)
MCV: 104.4 fL — ABNORMAL HIGH (ref 80.0–100.0)
Monocytes Absolute: 0.7 10*3/uL (ref 0.1–1.0)
Monocytes Relative: 8 %
Neutro Abs: 5.6 10*3/uL (ref 1.7–7.7)
Neutrophils Relative %: 59 %
Platelets: 123 10*3/uL — ABNORMAL LOW (ref 150–400)
RBC: 2.94 MIL/uL — ABNORMAL LOW (ref 4.22–5.81)
RDW: 14.5 % (ref 11.5–15.5)
WBC: 9.4 10*3/uL (ref 4.0–10.5)
nRBC: 0 % (ref 0.0–0.2)

## 2020-05-18 LAB — BASIC METABOLIC PANEL
Anion gap: 6 (ref 5–15)
BUN: 22 mg/dL (ref 8–23)
CO2: 22 mmol/L (ref 22–32)
Calcium: 6.8 mg/dL — ABNORMAL LOW (ref 8.9–10.3)
Chloride: 112 mmol/L — ABNORMAL HIGH (ref 98–111)
Creatinine, Ser: 1.99 mg/dL — ABNORMAL HIGH (ref 0.61–1.24)
GFR, Estimated: 34 mL/min — ABNORMAL LOW (ref >60)
Glucose, Bld: 116 mg/dL — ABNORMAL HIGH (ref 70–99)
Potassium: 3 mmol/L — ABNORMAL LOW (ref 3.5–5.1)
Sodium: 140 mmol/L (ref 135–145)

## 2020-05-18 LAB — VITAMIN D 25 HYDROXY (VIT D DEFICIENCY, FRACTURES): Vit D, 25-Hydroxy: 47.99 ng/mL (ref 30–100)

## 2020-05-18 LAB — MAGNESIUM
Magnesium: 0.9 mg/dL — CL (ref 1.7–2.4)
Magnesium: 1.3 mg/dL — ABNORMAL LOW (ref 1.7–2.4)

## 2020-05-18 MED ORDER — MAGNESIUM SULFATE 2 GM/50ML IV SOLN
2.0000 g | Freq: Once | INTRAVENOUS | Status: AC
  Administered 2020-05-18: 2 g via INTRAVENOUS
  Filled 2020-05-18: qty 50

## 2020-05-18 MED ORDER — POTASSIUM CHLORIDE CRYS ER 20 MEQ PO TBCR
40.0000 meq | EXTENDED_RELEASE_TABLET | Freq: Once | ORAL | Status: AC
  Administered 2020-05-18: 40 meq via ORAL
  Filled 2020-05-18: qty 2

## 2020-05-18 NOTE — Care Management (Signed)
[facility] dr Smith Mince  [facility] social worker [place] [phone]

## 2020-05-18 NOTE — Progress Notes (Signed)
Admitting MD notified about critical value of Magnesium, calcium and low potassium. See orders in epic. Will be reported to incoming nurse.

## 2020-05-18 NOTE — Progress Notes (Signed)
Progress Note    TALLEY KREISER  KVQ:[id number] DOB: 07-21-1945  DOA: 05/17/2020 PCP: Gayland Curry, DO    Brief Narrative:    Medical records reviewed and are as summarized below:  Brent [name] is an 75 y.o. male  with history of chronic kidney disease stage IV, chronic systolic heart failure, Crohn's disease, hypertension had routine labs done at outpatient [facility] and was found to be hypokalemic was advised to come to the ER.  Patient denies any new medications recently changed and denies any nausea vomiting or any change in his bowel habits.  Does take loperamide for Crohn's disease but he has not noticed any increased diarrhea.  Patient was started on Fraxiga by cardiologist in January.  Lives alone  Assessment/Plan:   Principal Problem:   Electrolyte abnormality Active Problems:   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HCC)   BPH (benign prostatic hyperplasia)   Anemia in chronic kidney disease   Essential hypertension, benign   Bipolar 1 disorder (HCC)   Chronic combined systolic and diastolic congestive heart failure ([place])   ICD (implantable cardioverter-defibrillator) in place    Multiple electrolyte derangements with low potassium of 2.7, calcium of 6.4, magnesium of 1.6.   - PTH and vitamin D levels.   -replete and recheck -continue tele  Chronic systolic heart failure appears compensated  -holding Lasix due to electrolyte derangements.   - hydralazine Imdur ivabradine and metoprolol.  History of Crohn's takes Entocort and Remicade.  No change in bowel habits recently.  Takes loperamide as needed.  Chronic kidney disease stage IV  -creatinine appears to be at baseline.  Anemia and thrombocytopenia  -appears to be chronic.   CHF pharmacy consult pending as medications from [place] do not match current list.    Family Communication/Anticipated D/C date and plan/Code Status   DVT prophylaxis: Lovenox ordered. Code Status: Full Code.   Disposition Plan:  Status is: Inpatient  Remains inpatient appropriate because:Inpatient level of care appropriate due to severity of illness   Dispo: The patient is from: Home              Anticipated d/c is to: Home              Patient currently is not medically stable to d/c.   Difficult to place patient No         Medical Consultants:    CHF pharmacy team    Subjective:   No complaints  Objective:    Vitals:   05/17/20 2241 05/17/20 2307 05/18/20 0526 05/18/20 1209  BP:  (!) 154/93 (!) 156/92 132/86  Pulse:  89 86 90  Resp:  17 17 16   Temp: 97.7 F (36.5 C) 98 F (36.7 C) 97.7 F (36.5 C) 98.6 F (37 C)  TempSrc: Oral Oral Oral Oral  SpO2:  96% 95%   Weight:   79.1 kg     Intake/Output Summary (Last 24 hours) at 05/18/2020 1222 Last data filed at 05/18/2020 6433 Gross per 24 hour  Intake 889.94 ml  Output --  Net 889.94 ml   Filed Weights   05/18/20 0526  Weight: 79.1 kg    Exam:  General: Appearance:    elderly male in no acute distress     Lungs:     respirations unlabored  Heart:    Normal heart rate. Normal rhythm. No murmurs, rubs, or gallops.   MS:   All extremities are intact.   Neurologic:   Awake, alert, oriented  x 3    Data Reviewed:   I have personally reviewed following labs and imaging studies:  Labs: Labs show the following:   Basic Metabolic Panel: Recent Labs  Lab 05/17/20 1814 05/18/20 0524  NA 141 141  K 2.7* 2.8*  CL 108 112*  CO2 25 20*  GLUCOSE 84 74  BUN 27* 24*  CREATININE 2.21* 1.78*  CALCIUM 6.4* 6.2*  MG 0.6* 0.9*   GFR Estimated Creatinine Clearance: 40.1 mL/min (A) (by C-G formula based on SCr of 1.78 mg/dL (H)). Liver Function Tests: Recent Labs  Lab 05/18/20 0524  AST 11*  11*  ALT 10  9  ALKPHOS 43  44  BILITOT 0.6  0.5  PROT 5.9*  5.9*  ALBUMIN 2.3*  2.3*   No results for input(s): LIPASE, AMYLASE in the last 168 hours. No results for input(s): AMMONIA in the last 168 hours. Coagulation  profile No results for input(s): INR, PROTIME in the last 168 hours.  CBC: Recent Labs  Lab 05/17/20 1814 05/18/20 0524  WBC 11.1* 9.4  NEUTROABS 6.8 5.6  HGB 11.0* 9.8*  HCT 35.5* 30.7*  MCV 107.6* 104.4*  PLT 133* 123*   Cardiac Enzymes: No results for input(s): CKTOTAL, CKMB, CKMBINDEX, TROPONINI in the last 168 hours. BNP (last 3 results) No results for input(s): PROBNP in the last 8760 hours. CBG: No results for input(s): GLUCAP in the last 168 hours. D-Dimer: No results for input(s): DDIMER in the last 72 hours. Hgb A1c: No results for input(s): HGBA1C in the last 72 hours. Lipid Profile: No results for input(s): CHOL, HDL, LDLCALC, TRIG, CHOLHDL, LDLDIRECT in the last 72 hours. Thyroid function studies: No results for input(s): TSH, T4TOTAL, T3FREE, THYROIDAB in the last 72 hours.  Invalid input(s): FREET3 Anemia work up: No results for input(s): VITAMINB12, FOLATE, FERRITIN, TIBC, IRON, RETICCTPCT in the last 72 hours. Sepsis Labs: Recent Labs  Lab 05/17/20 1814 05/18/20 0524  WBC 11.1* 9.4    Microbiology Recent Results (from the past 240 hour(s))  Resp Panel by RT-PCR (Flu A&B, Covid) Nasopharyngeal Swab     Status: None   Collection Time: 05/17/20  9:40 PM   Specimen: Nasopharyngeal Swab; Nasopharyngeal(NP) swabs in vial transport medium  Result Value Ref Range Status   SARS Coronavirus 2 by RT PCR NEGATIVE NEGATIVE Final    Comment: (NOTE) SARS-CoV-2 target nucleic acids are NOT DETECTED.  The SARS-CoV-2 RNA is generally detectable in upper respiratory specimens during the acute phase of infection. The lowest concentration of SARS-CoV-2 viral copies this assay can detect is 138 copies/mL. A negative result does not preclude SARS-Cov-2 infection and should not be used as the sole basis for treatment or other patient management decisions. A negative result may occur with  improper specimen collection/handling, submission of specimen other than  nasopharyngeal swab, presence of viral mutation(s) within the areas targeted by this assay, and inadequate number of viral copies(<138 copies/mL). A negative result must be combined with clinical observations, patient history, and epidemiological information. The expected result is Negative.  Fact Sheet for Patients:  EntrepreneurPulse.com.au  Fact Sheet for Healthcare Providers:  IncredibleEmployment.be  This test is no t yet approved or cleared by the [place] FDA and  has been authorized for detection and/or diagnosis of SARS-CoV-2 by FDA under an Emergency Use Authorization (EUA). This EUA will remain  in effect (meaning this test can be used) for the duration of the COVID-19 declaration under Section 564(b)(1) of the Act, 21 U.S.C.section 360bbb-3(b)(1), unless  the authorization is terminated  or revoked sooner.       Influenza A by PCR NEGATIVE NEGATIVE Final   Influenza B by PCR NEGATIVE NEGATIVE Final    Comment: (NOTE) The Xpert Xpress SARS-CoV-2/FLU/RSV plus assay is intended as an aid in the diagnosis of influenza from Nasopharyngeal swab specimens and should not be used as a sole basis for treatment. Nasal washings and aspirates are unacceptable for Xpert Xpress SARS-CoV-2/FLU/RSV testing.  Fact Sheet for Patients: EntrepreneurPulse.com.au  Fact Sheet for Healthcare Providers: IncredibleEmployment.be  This test is not yet approved or cleared by the [place] FDA and has been authorized for detection and/or diagnosis of SARS-CoV-2 by FDA under an Emergency Use Authorization (EUA). This EUA will remain in effect (meaning this test can be used) for the duration of the COVID-19 declaration under Section 564(b)(1) of the Act, 21 U.S.C. section 360bbb-3(b)(1), unless the authorization is terminated or revoked.  Performed at [facility], [place] [address] [place], [place] [address]      Procedures and diagnostic studies:  No results found.  Medications:   . allopurinol  200 mg Oral Daily  . apixaban  5 mg Oral BID  . budesonide  9 mg Oral Daily  . cholecalciferol  5,000 Units Oral Daily  . darifenacin  7.5 mg Oral Daily  . divalproex  1,500 mg Oral QHS  . ferrous sulfate  325 mg Oral BID WC  . hydrALAZINE  25 mg Oral TID  . isosorbide mononitrate  60 mg Oral Daily  . ivabradine  5 mg Oral BID WC  . metoprolol succinate  25 mg Oral Daily  . mirtazapine  7.5 mg Oral QHS  . pantoprazole  40 mg Oral Daily  . prazosin  1 mg Oral QHS  . QUEtiapine  100 mg Oral QHS  . tamsulosin  0.4 mg Oral Daily  . vitamin B-12  1,000 mcg Oral Daily   Continuous Infusions:   LOS: 0 days   Geradine Girt  [facility]   How to contact the [facility] Attending or Consulting provider [place] or covering provider during after hours [place], for this patient?  1. Check the care team in [facility] and look for a) attending/consulting TRH provider listed and b) the [facility] team listed 2. Log into [email or web address] and use [place]'s universal password to access. If you do not have the password, please contact the hospital operator. 3. Locate the [facility] provider you are looking for under [facility] and page to a number that you can be directly reached. 4. If you still have difficulty reaching the provider, please page the [facility] (Director on Call) for the Hospitalists listed on amion for assistance.  05/18/2020, 12:22 PM

## 2020-05-18 NOTE — Progress Notes (Addendum)
Heart Failure Stewardship Pharmacist Progress Note  Outpatient medication list updated and verified through prescription fill records at [facility] and Epic dispense report.  Current active outpatient medications:  Medication Pharmacy  Allopurinol 200 mg daily [facility]  [facility]aban [facility] BID [facility]  [facility]udesonide 9 mg daily [facility]  Corlanor 5 mg BID [facility]  Depakote ER 500 mg - 1500 mg daily at bedtime [facility]  Ensure 1 can TID [facility]  Lasix 20 mg every M/W/F [facility]  Hydralazine 25 mg TID [facility]  Remicade 100 mg 45m/kg q8weeks [facility]  Imdur 60 mg daily [facility]  Loperamide 2 mg TID prn [facility]  Metoprolol XL 25 mg BID [facility]  Omeprazole 20 mg daily [facility]  Prazosin 1 mg daily at bedtime [facility]  Quetiapine 150 mg daily at bedtime [facility]  Tamsulosin 0.4 mg daily [facility]  Cyanocobalamin 1000 mcg daily [facility]  Farxiga 10 mg daily Via manufacturer   On med list but unclear where medication is filled:  Cholecalciferol 5000 units daily  Epogen 10,000 units q30 days ([facility]  Ferrous sulfate 325 mg BID   Mirtazapine 7.5 mg daily  Vesicare 5 mg daily  [facility]:  Clotrimazole 1% topical solution  Diclofenac 1% topical gel  Magnesium oxide 420 mg  Terbinafine 250 mg   Oxybutynin 10 mg   Recommend to prescribe magnesium at discharge for continued repletion.  SFara Olden PharmD PGY-1 Pharmacy Resident 05/18/2020 2:29 PM

## 2020-05-19 ENCOUNTER — Other Ambulatory Visit: Payer: Self-pay

## 2020-05-19 LAB — CBC
HCT: 31.8 % — ABNORMAL LOW (ref 39.0–52.0)
Hemoglobin: 10 g/dL — ABNORMAL LOW (ref 13.0–17.0)
MCH: 32.7 pg (ref 26.0–34.0)
MCHC: 31.4 g/dL (ref 30.0–36.0)
MCV: 103.9 fL — ABNORMAL HIGH (ref 80.0–100.0)
Platelets: 130 10*3/uL — ABNORMAL LOW (ref 150–400)
RBC: 3.06 MIL/uL — ABNORMAL LOW (ref 4.22–5.81)
RDW: 14.5 % (ref 11.5–15.5)
WBC: 9.5 10*3/uL (ref 4.0–10.5)
nRBC: 0 % (ref 0.0–0.2)

## 2020-05-19 LAB — BASIC METABOLIC PANEL
Anion gap: 8 (ref 5–15)
BUN: 21 mg/dL (ref 8–23)
CO2: 20 mmol/L — ABNORMAL LOW (ref 22–32)
Calcium: 7.2 mg/dL — ABNORMAL LOW (ref 8.9–10.3)
Chloride: 114 mmol/L — ABNORMAL HIGH (ref 98–111)
Creatinine, Ser: 1.8 mg/dL — ABNORMAL HIGH (ref 0.61–1.24)
GFR, Estimated: 39 mL/min — ABNORMAL LOW (ref >60)
Glucose, Bld: 87 mg/dL (ref 70–99)
Potassium: 3.5 mmol/L (ref 3.5–5.1)
Sodium: 142 mmol/L (ref 135–145)

## 2020-05-19 LAB — PTH, INTACT AND CALCIUM
Calcium, Total (PTH): 6.1 mg/dL (ref 8.6–10.2)
PTH: 61 pg/mL (ref 15–65)

## 2020-05-19 MED ORDER — FUROSEMIDE 20 MG PO TABS
20.0000 mg | ORAL_TABLET | Freq: Once | ORAL | Status: AC
  Administered 2020-05-19: 20 mg via ORAL
  Filled 2020-05-19: qty 1

## 2020-05-19 MED ORDER — MAGNESIUM SULFATE 4 GM/100ML IV SOLN
4.0000 g | Freq: Once | INTRAVENOUS | Status: AC
  Administered 2020-05-19: 4 g via INTRAVENOUS
  Filled 2020-05-19: qty 100

## 2020-05-19 MED ORDER — MAGNESIUM 400 MG PO TABS: 400.0000 mg | ORAL_TABLET | Freq: Every day | ORAL | 1 refills | Status: DC

## 2020-05-19 MED ORDER — POTASSIUM CHLORIDE CRYS ER 20 MEQ PO TBCR
40.0000 meq | EXTENDED_RELEASE_TABLET | Freq: Once | ORAL | Status: AC
  Administered 2020-05-19: 40 meq via ORAL
  Filled 2020-05-19: qty 2

## 2020-05-19 MED ORDER — CALCIUM 500 MG PO TABS: 500.0000 mg | ORAL_TABLET | ORAL | Status: DC

## 2020-05-19 NOTE — Progress Notes (Signed)
Nsg Discharge Note  Reviewed discharge instructions. Patient voiced understanding that both new prescriptions are OTC. Patient has car here and will drive self home.   Admit Date:  05/17/2020 Discharge date: 05/19/2020   Brent [name] to be D/C'd Home per MD order.  AVS completed.  Copy for chart, and copy for patient signed, and dated. Patient/caregiver able to verbalize understanding.  Discharge Medication: Allergies as of 05/19/2020      Reactions   Azathioprine Other (See Comments)   REACTION: affected WBC "Almost died"   Ciprofloxacin Other (See Comments)   Reaction not recalled   Levaquin [levofloxacin In D5w] Other (See Comments)   Reaction not rec   Plendil [felodipine] Other (See Comments)   Reaction not not recalled      Medication List    TAKE these medications   acetaminophen 160 MG/5ML liquid Commonly known as: TYLENOL Take 325 mg by mouth every 4 (four) hours as needed for fever.   allopurinol 100 MG tablet Commonly known as: ZYLOPRIM Take 2 tablets (200 mg total) by mouth daily.   apixaban 5 MG Tabs tablet Commonly known as: ELIQUIS Take 1 tablet (5 mg total) by mouth 2 (two) times daily.   BUDESONIDE PO Take 9 mg by mouth daily.   Calcium 500 MG tablet Take 1 tablet (500 mg total) by mouth 1 day or 1 dose for 1 dose.   Corlanor 5 MG Tabs tablet Generic drug: ivabradine TAKE 1 TABLET(5 MG) BY MOUTH TWICE DAILY WITH A MEAL   dapagliflozin propanediol 10 MG Tabs tablet Commonly known as: Farxiga Take 1 tablet (10 mg total) by mouth daily before breakfast.   Depakote ER 500 MG 24 hr tablet Generic drug: divalproex Take 1,500 mg by mouth at bedtime.   Ensure Take 1 Can by mouth 3 (three) times daily.   epoetin alfa 10000 UNIT/ML injection Commonly known as: EPOGEN Inject 10,000 Units into the skin every 30 (thirty) days.   ferrous sulfate 325 (65 FE) MG tablet Take 325 mg by mouth in the morning and at bedtime.   furosemide 20 MG  tablet Commonly known as: LASIX Take 1 tablet (20 mg total) by mouth every Monday, Wednesday, and Friday.   hydrALAZINE 25 MG tablet Commonly known as: APRESOLINE Take 25 mg by mouth 3 (three) times daily.   inFLIXimab 100 MG injection Commonly known as: Remicade Infuse Remicade IV schedule 1 44m/kg every 8 weeks Premedicate with Tylenol 500-6588mby mouth and Benadryl 25-50100my mouth prior to infusion. Last PPD was on 12/2009.   isosorbide mononitrate 60 MG 24 hr tablet Commonly known as: IMDUR Take 1 tablet (60 mg total) by mouth daily.   loperamide 2 MG capsule Commonly known as: IMODIUM Take 2 mg by mouth 3 (three) times daily as needed for diarrhea or loose stools.   Magnesium 400 MG Tabs Take 400 mg by mouth daily.   metoprolol succinate 25 MG 24 hr tablet Commonly known as: TOPROL-XL Take 1 tablet (25 mg total) by mouth in the morning and at bedtime.   mirtazapine 7.5 MG tablet Commonly known as: REMERON TAKE 1 TABLET(7.5 MG) BY MOUTH AT BEDTIME   omeprazole 20 MG capsule Commonly known as: PRILOSEC Take 20 mg by mouth daily.   prazosin 1 MG capsule Commonly known as: MINIPRESS Take 1 mg by mouth at bedtime.   QUEtiapine 100 MG tablet Commonly known as: SEROQUEL Take 150 mg by mouth at bedtime.   solifenacin 5 MG tablet Commonly known as:  VESICARE Take 5 mg by mouth daily.   tamsulosin 0.4 MG Caps capsule Commonly known as: FLOMAX Take 1 capsule (0.4 mg total) by mouth daily.   vitamin B-12 500 MCG tablet Commonly known as: CYANOCOBALAMIN Take 1,000 mcg by mouth daily.   Vitamin D 125 MCG (5000 UT) Caps Take 5,000 Units by mouth daily.       Discharge Assessment: Vitals:   05/19/20 0448 05/19/20 0810  BP: (!) 148/76 (!) 149/81  Pulse: 82 82  Resp: 18   Temp: 97.7 F (36.5 C)   SpO2: 95%    Skin clean, dry and intact without evidence of skin break down, no evidence of skin tears noted. IV catheter discontinued intact. Site without signs  and symptoms of complications - no redness or edema noted at insertion site, patient denies c/o pain - only slight tenderness at site.  Dressing with slight pressure applied.  D/c Instructions-Education: Discharge instructions given to patient/family with verbalized understanding. D/c education completed with patient/family including follow up instructions, medication list, d/c activities limitations if indicated, with other d/c instructions as indicated by MD - patient able to verbalize understanding, all questions fully answered. Patient instructed to return to ED, call 911, or call MD for any changes in condition.  Patient escorted via [place], and D/C home via private auto.  Erasmo Leventhal, RN 05/19/2020 1:17 PM

## 2020-05-19 NOTE — TOC Transition Note (Signed)
Transition of Care [facility]) - CM/SW Discharge Note   Patient Details  Name: Brent [name] MRN: [id number] Date of Birth: October 23, 1945  [facility] CM/SW Contact:  Carles Collet, RN Phone Number: 05/19/2020, 12:29 PM   Clinical Narrative:     Brent [name] w patient over the phone. He states that will fill his prescriptions at [facility], informed him the nurse will hand him his prescriptions with his discharge papers. We discussed OP PT recs. He understands that he will need to call the [place] on Monday for them to set that up.  No other CM needs identified.

## 2020-05-19 NOTE — Evaluation (Signed)
Physical Therapy Evaluation Patient Details Name: Brent [name] MRN: [id number] DOB: 25-Jun-1945 Today's Date: 05/19/2020   History of Present Illness  The pt is a 75 yo male presenting 3/24  after abnormal labs (hypokalemic), and admitted for electrolyte imbalance. PMH includes: CKD IV, CHF, chron's disease, HTN, BPPV, celiac disease, and gout.    Clinical Impression  Pt up walking around room upon arrival of PT, agreeable to evaluation at this time. Prior to admission the pt was independent with all mobility, ADLs, and driving himself to all errands and appointments. The pt now presents with minor  limitations in functional mobility, endurance, and dynamic stability due to above dx, but is safe to return home when medically cleared. The pt was able to demo god independence with transfers and short ambulation in his room, but does present with deficits in endurance, BLE strength and ROM, and dynamic balance on longer ambulation and with addition of standing balance challenges. The pt was challenged by all standing balance with prolonged SLS or narrow BOS, and required minA at times to steady. He also presents with shuffling gait pattern where he lacks full knee extension both in stance and swing phase, does not utilize heel-strike or adequate toe-off with either leg. The pt will benefit from OPPT to address these deficits, and reduce risk of falls or fall related injury following d/c. No further acute PT needs identified at this time. Thank you for the consult.    Follow Up Recommendations Outpatient PT;Supervision - Intermittent    Equipment Recommendations  None recommended by PT    Recommendations for Other Services       Precautions / Restrictions Precautions Precautions: Fall Precaution Comments: low fall Restrictions Weight Bearing Restrictions: No      Mobility  Bed Mobility Overal bed mobility: Independent                  Transfers Overall transfer level:  Independent               General transfer comment: safe without AD  Ambulation/Gait Ambulation/Gait assistance: Supervision Gait Distance (Feet): 200 Feet Assistive device: None Gait Pattern/deviations: Step-through pattern;Decreased stride length;Decreased dorsiflexion - right;Decreased dorsiflexion - left;Shuffle (knees flexed) Gait velocity: 0.6 m/s   General Gait Details: short strides with absent heel-toe pattern, minimal DF or toe clearance with each step. pt tends to maintain slight knee flexion, never reaching full knee extension with swing or stance phase bilaterally. increased instability/sway with narrow BOS  Stairs Stairs: Yes Stairs assistance: Min guard Stair Management: One rail Left;Step to pattern;Forwards Number of Stairs: 1 (x10) General stair comments: x5 step-ups with each LE, pt reliant on single UE support on rail. worse control with LLE     Balance Overall balance assessment: Needs assistance Sitting-balance support: No upper extremity supported Sitting balance-Leahy Scale: Normal     Standing balance support: No upper extremity supported Standing balance-Leahy Scale: Good Standing balance comment: gait without AD, minG/minA to steady with addition of challenge such as changes in speed, quick turns, or narrow BOS                             Pertinent Vitals/Pain Pain Assessment: No/denies pain    Home Living Family/patient expects to be discharged to:: Private residence Living Arrangements: Alone Available Help at Discharge: Friend(s);Available PRN/intermittently Type of Home: Apartment Home Access: Stairs to enter Entrance Stairs-Rails: None Entrance Stairs-Number of Steps: 1 Home Layout: One level  Home Equipment: [place] - 2 wheels;Cane - single point;Walker - 4 wheels      Prior Function Level of Independence: Independent         Comments: pt reports he drives, no falls in last 6 months     Hand Dominance    Dominant Hand: Right    Extremity/Trunk Assessment   Upper Extremity Assessment Upper Extremity Assessment: Overall WFL for tasks assessed    Lower Extremity Assessment Lower Extremity Assessment: Generalized weakness;RLE deficits/detail;LLE deficits/detail RLE Deficits / Details: grossly 4/5, limited ROM knee extension and limited active DF with gait. LLE Deficits / Details: grossly 4/5, limited ROM knee extension and limited active DF with gait.    Cervical / Trunk Assessment Cervical / Trunk Assessment: Normal  Communication   Communication: No difficulties  Cognition Arousal/Alertness: Awake/alert Behavior During Therapy: WFL for tasks assessed/performed Overall Cognitive Status: Within Functional Limits for tasks assessed                                        General Comments General comments (skin integrity, edema, etc.): VSS through session HR 106 after stairs    Exercises     Assessment/Plan    PT Assessment All further PT needs can be met in the next venue of care  PT Problem List Decreased strength;Decreased range of motion;Decreased activity tolerance;Decreased mobility;Decreased balance;Decreased coordination       PT Treatment Interventions      PT Goals (Current goals can be found in the Care Plan section)  Acute Rehab PT Goals Patient Stated Goal: return home PT Goal Formulation: With patient Time For Goal Achievement: 06/02/20 Potential to Achieve Goals: Good     AM-PAC PT "6 Clicks" Mobility  Outcome Measure Help needed turning from your back to your side while in a flat bed without using bedrails?: None Help needed moving from lying on your back to sitting on the side of a flat bed without using bedrails?: None Help needed moving to and from a bed to a chair (including a wheelchair)?: None Help needed standing up from a chair using your arms (e.g., wheelchair or bedside chair)?: None Help needed to walk in hospital room?:  None Help needed climbing 3-5 steps with a railing? : A Little 6 Click Score: 23    End of Session Equipment Utilized During Treatment: Gait belt Activity Tolerance: Patient tolerated treatment well Patient left:  (ambulating in room) Nurse Communication: Mobility status PT Visit Diagnosis: Other abnormalities of gait and mobility (R26.89);Muscle weakness (generalized) (M62.81)    Time: 1007-1219 PT Time Calculation (min) (ACUTE ONLY): 11 min   Charges:   PT Evaluation $PT Eval Low Complexity: 1 Low          Karma Ganja, PT, DPT   Acute Rehabilitation Department Pager #: [phone]  Otho Bellows 05/19/2020, 12:14 PM

## 2020-05-19 NOTE — Discharge Summary (Signed)
Physician Discharge Summary  Brent [name] ZWC:[id number] DOB: 1946-01-24 DOA: 05/17/2020  PCP: Brent Curry, DO  Admit date: 05/17/2020 Discharge date: 05/19/2020  Admitted From: home Discharge disposition: home   Recommendations for Outpatient Follow-Up:   1. Palliative care referral  2. Follow magnesium, follow calcium   Discharge Diagnosis:   Principal Problem:   Electrolyte abnormality Active Problems:   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HCC)   BPH (benign prostatic hyperplasia)   Anemia in chronic kidney disease   Essential hypertension, benign   Bipolar 1 disorder (HCC)   Chronic combined systolic and diastolic congestive heart failure ([place])   ICD (implantable cardioverter-defibrillator) in place    Discharge Condition: Improved.  Diet recommendation: Low sodium, heart healthy  Wound care: None.  Code status: Full.   History of Present Illness:   Brent [name] is a 75 y.o. male with history of chronic kidney disease stage IV, chronic systolic heart failure, Crohn's disease, hypertension had routine labs done at outpatient [facility] and was found to be hypokalemic was advised to come to the ER.  Patient denies any new medications recently changed and denies any nausea vomiting or any change in his bowel habits.  Does take loperamide for Crohn's disease but he has not noticed any increased diarrhea.  Patient was started on Fraxiga by cardiologist in January 3 months ago.   Hospital Course by Problem:   Multiple electrolyte derangements with low potassium of 2.7, calcium of 6.4, magnesium of 1.6.  -replete and recheck -continue tele  Chronic systolic heart failure appears compensated  -holding Lasix due to electrolyte derangements.  - hydralazine Imdur ivabradine and metoprolol.  History of Crohn's takes Entocort and Remicade. No change in bowel habits recently. Takes loperamide as needed.  Chronic kidney disease stage IV  -creatinine  appears to be at baseline.  Anemia and thrombocytopenia  -appears to be chronic.     Medical Consultants:      Discharge Exam:   Vitals:   05/19/20 0448 05/19/20 0810  BP: (!) 148/76 (!) 149/81  Pulse: 82 82  Resp: 18   Temp: 97.7 F (36.5 C)   SpO2: 95%    Vitals:   05/18/20 1804 05/18/20 2345 05/19/20 0448 05/19/20 0810  BP: 138/77 138/82 (!) 148/76 (!) 149/81  Pulse: 76 76 82 82  Resp: 16 18 18    Temp: 99 F (37.2 C) 98.1 F (36.7 C) 97.7 F (36.5 C)   TempSrc: Oral Oral Oral   SpO2: 95% 95% 95%   Weight:        General exam: Appears calm and comfortable.    The results of significant diagnostics from this hospitalization (including imaging, microbiology, ancillary and laboratory) are listed below for reference.     Procedures and Diagnostic Studies:   No results found.   Labs:   Basic Metabolic Panel: Recent Labs  Lab 05/17/20 1814 05/18/20 0524 05/18/20 1451 05/19/20 0234  NA 141 141 140 142  K 2.7* 2.8* 3.0* 3.5  CL 108 112* 112* 114*  CO2 25 20* 22 20*  GLUCOSE 84 74 116* 87  BUN 27* 24* 22 21  CREATININE 2.21* 1.78* 1.99* 1.80*  CALCIUM 6.4* 6.2*  6.1 6.8* 7.2*  MG 0.6* 0.9* 1.3*  --    GFR Estimated Creatinine Clearance: 39.7 mL/min (A) (by C-G formula based on SCr of 1.8 mg/dL (H)). Liver Function Tests: Recent Labs  Lab 05/18/20 0524  AST 11*  11*  ALT 10  9  ALKPHOS 43  44  BILITOT 0.6  0.5  PROT 5.9*  5.9*  ALBUMIN 2.3*  2.3*   No results for input(s): LIPASE, AMYLASE in the last 168 hours. No results for input(s): AMMONIA in the last 168 hours. Coagulation profile No results for input(s): INR, PROTIME in the last 168 hours.  CBC: Recent Labs  Lab 05/17/20 1814 05/18/20 0524 05/19/20 0234  WBC 11.1* 9.4 9.5  NEUTROABS 6.8 5.6  --   HGB 11.0* 9.8* 10.0*  HCT 35.5* 30.7* 31.8*  MCV 107.6* 104.4* 103.9*  PLT 133* 123* 130*   Cardiac Enzymes: No results for input(s): CKTOTAL, CKMB, CKMBINDEX,  TROPONINI in the last 168 hours. BNP: Invalid input(s): POCBNP CBG: No results for input(s): GLUCAP in the last 168 hours. D-Dimer No results for input(s): DDIMER in the last 72 hours. Hgb A1c No results for input(s): HGBA1C in the last 72 hours. Lipid Profile No results for input(s): CHOL, HDL, LDLCALC, TRIG, CHOLHDL, LDLDIRECT in the last 72 hours. Thyroid function studies No results for input(s): TSH, T4TOTAL, T3FREE, THYROIDAB in the last 72 hours.  Invalid input(s): FREET3 Anemia work up No results for input(s): VITAMINB12, FOLATE, FERRITIN, TIBC, IRON, RETICCTPCT in the last 72 hours. Microbiology Recent Results (from the past 240 hour(s))  Resp Panel by RT-PCR (Flu A&B, Covid) Nasopharyngeal Swab     Status: None   Collection Time: 05/17/20  9:40 PM   Specimen: Nasopharyngeal Swab; Nasopharyngeal(NP) swabs in vial transport medium  Result Value Ref Range Status   SARS Coronavirus 2 by RT PCR NEGATIVE NEGATIVE Final    Comment: (NOTE) SARS-CoV-2 target nucleic acids are NOT DETECTED.  The SARS-CoV-2 RNA is generally detectable in upper respiratory specimens during the acute phase of infection. The lowest concentration of SARS-CoV-2 viral copies this assay can detect is 138 copies/mL. A negative result does not preclude SARS-Cov-2 infection and should not be used as the sole basis for treatment or other patient management decisions. A negative result may occur with  improper specimen collection/handling, submission of specimen other than nasopharyngeal swab, presence of viral mutation(s) within the areas targeted by this assay, and inadequate number of viral copies(<138 copies/mL). A negative result must be combined with clinical observations, patient history, and epidemiological information. The expected result is Negative.  Fact Sheet for Patients:  EntrepreneurPulse.com.au  Fact Sheet for Healthcare Providers:   IncredibleEmployment.be  This test is no t yet approved or cleared by the [place] FDA and  has been authorized for detection and/or diagnosis of SARS-CoV-2 by FDA under an Emergency Use Authorization (EUA). This EUA will remain  in effect (meaning this test can be used) for the duration of the COVID-19 declaration under Section 564(b)(1) of the Act, 21 U.S.C.section 360bbb-3(b)(1), unless the authorization is terminated  or revoked sooner.       Influenza A by PCR NEGATIVE NEGATIVE Final   Influenza B by PCR NEGATIVE NEGATIVE Final    Comment: (NOTE) The Xpert Xpress SARS-CoV-2/FLU/RSV plus assay is intended as an aid in the diagnosis of influenza from Nasopharyngeal swab specimens and should not be used as a sole basis for treatment. Nasal washings and aspirates are unacceptable for Xpert Xpress SARS-CoV-2/FLU/RSV testing.  Fact Sheet for Patients: EntrepreneurPulse.com.au  Fact Sheet for Healthcare Providers: IncredibleEmployment.be  This test is not yet approved or cleared by the [place] FDA and has been authorized for detection and/or diagnosis of SARS-CoV-2 by FDA under an Emergency Use Authorization (EUA). This EUA will remain in  effect (meaning this test can be used) for the duration of the COVID-19 declaration under Section 564(b)(1) of the Act, 21 U.S.C. section 360bbb-3(b)(1), unless the authorization is terminated or revoked.  Performed at [facility], [place] [address], [place], [place] [address]      Discharge Instructions:   Discharge Instructions    (HEART FAILURE PATIENTS) Call MD:  Anytime you have any of the following symptoms: 1) 3 pound weight gain in 24 hours or 5 pounds in 1 week 2) shortness of breath, with or without a dry hacking cough 3) swelling in the hands, feet or stomach 4) if you have to sleep on extra pillows at night in order to breathe.   Complete by: As directed     Diet - low sodium heart healthy   Complete by: As directed    Discharge instructions   Complete by: As directed    Be sure to take magnesium and follow levels   Heart Failure patients record your daily weight using the same scale at the same time of day   Complete by: As directed    Increase activity slowly   Complete by: As directed      Allergies as of 05/19/2020      Reactions   Azathioprine Other (See Comments)   REACTION: affected WBC "Almost died"   Ciprofloxacin Other (See Comments)   Reaction not recalled   Levaquin [levofloxacin In D5w] Other (See Comments)   Reaction not rec   Plendil [felodipine] Other (See Comments)   Reaction not not recalled      Medication List    TAKE these medications   acetaminophen 160 MG/5ML liquid Commonly known as: TYLENOL Take 325 mg by mouth every 4 (four) hours as needed for fever.   allopurinol 100 MG tablet Commonly known as: ZYLOPRIM Take 2 tablets (200 mg total) by mouth daily.   apixaban 5 MG Tabs tablet Commonly known as: ELIQUIS Take 1 tablet (5 mg total) by mouth 2 (two) times daily.   BUDESONIDE PO Take 9 mg by mouth daily.   Calcium 500 MG tablet Take 1 tablet (500 mg total) by mouth 1 day or 1 dose for 1 dose.   Corlanor 5 MG Tabs tablet Generic drug: ivabradine TAKE 1 TABLET(5 MG) BY MOUTH TWICE DAILY WITH A MEAL   dapagliflozin propanediol 10 MG Tabs tablet Commonly known as: Farxiga Take 1 tablet (10 mg total) by mouth daily before breakfast.   Depakote ER 500 MG 24 hr tablet Generic drug: divalproex Take 1,500 mg by mouth at bedtime.   Ensure Take 1 Can by mouth 3 (three) times daily.   epoetin alfa 10000 UNIT/ML injection Commonly known as: EPOGEN Inject 10,000 Units into the skin every 30 (thirty) days.   ferrous sulfate 325 (65 FE) MG tablet Take 325 mg by mouth in the morning and at bedtime.   furosemide 20 MG tablet Commonly known as: LASIX Take 1 tablet (20 mg total) by mouth every  Monday, Wednesday, and Friday.   hydrALAZINE 25 MG tablet Commonly known as: APRESOLINE Take 25 mg by mouth 3 (three) times daily.   inFLIXimab 100 MG injection Commonly known as: Remicade Infuse Remicade IV schedule 1 68m/kg every 8 weeks Premedicate with Tylenol 500-6581mby mouth and Benadryl 25-5059my mouth prior to infusion. Last PPD was on 12/2009.   isosorbide mononitrate 60 MG 24 hr tablet Commonly known as: IMDUR Take 1 tablet (60 mg total) by mouth daily.   loperamide  2 MG capsule Commonly known as: IMODIUM Take 2 mg by mouth 3 (three) times daily as needed for diarrhea or loose stools.   Magnesium 400 MG Tabs Take 400 mg by mouth daily.   metoprolol succinate 25 MG 24 hr tablet Commonly known as: TOPROL-XL Take 1 tablet (25 mg total) by mouth in the morning and at bedtime.   mirtazapine 7.5 MG tablet Commonly known as: REMERON TAKE 1 TABLET(7.5 MG) BY MOUTH AT BEDTIME   omeprazole 20 MG capsule Commonly known as: PRILOSEC Take 20 mg by mouth daily.   prazosin 1 MG capsule Commonly known as: MINIPRESS Take 1 mg by mouth at bedtime.   QUEtiapine 100 MG tablet Commonly known as: SEROQUEL Take 150 mg by mouth at bedtime.   solifenacin 5 MG tablet Commonly known as: VESICARE Take 5 mg by mouth daily.   tamsulosin 0.4 MG Caps capsule Commonly known as: FLOMAX Take 1 capsule (0.4 mg total) by mouth daily.   vitamin B-12 500 MCG tablet Commonly known as: CYANOCOBALAMIN Take 1,000 mcg by mouth daily.   Vitamin D 125 MCG (5000 UT) Caps Take 5,000 Units by mouth daily.         Time coordinating discharge: 35 min  Signed:  Geradine Girt DO  [facility] 05/19/2020, 10:52 AM

## 2020-05-22 ENCOUNTER — Telehealth: Payer: Self-pay | Admitting: *Deleted

## 2020-05-22 NOTE — Telephone Encounter (Signed)
Transition Care Management Follow-Up Telephone Call   Date discharged and where:05/19/2020 [place]  How have you been since you were released from the hospital? Good  Any patient concerns? No Concerns  Items Reviewed:   Meds: Yes  Allergies:Yes  Dietary Changes Reviewed:Yes  Functional Questionnaire:  Independent-I Dependent-D  ADLs:I Has Home Health   Dressing- I    Eating-I   Maintaining continence-I   Transferring-I   Transportation-I   Meal Prep-I   Managing Meds- I  Confirmed importance and Date/Time of follow-up visits scheduled:Yes Appointment 05/23/2020. Wants Dr. Sabra Heck as new PCP.    Confirmed with patient if condition worsens to call PCP or go to the Emergency Dept. Patient was given office number and encouraged to call back with questions or concerns: Yes

## 2020-05-23 ENCOUNTER — Encounter: Payer: Self-pay | Admitting: Family Medicine

## 2020-05-23 ENCOUNTER — Ambulatory Visit (INDEPENDENT_AMBULATORY_CARE_PROVIDER_SITE_OTHER): Payer: Medicare Other | Admitting: Family Medicine

## 2020-05-23 ENCOUNTER — Other Ambulatory Visit: Payer: Self-pay

## 2020-05-23 ENCOUNTER — Other Ambulatory Visit (HOSPITAL_COMMUNITY): Payer: Self-pay

## 2020-05-23 VITALS — BP 120/90 | HR 80 | Temp 99.1°F | Ht 76.0 in | Wt 180.2 lb

## 2020-05-23 DIAGNOSIS — Z789 Other specified health status: Secondary | ICD-10-CM | POA: Diagnosis not present

## 2020-05-23 DIAGNOSIS — K5000 Crohn's disease of small intestine without complications: Secondary | ICD-10-CM

## 2020-05-23 DIAGNOSIS — I10 Essential (primary) hypertension: Secondary | ICD-10-CM | POA: Diagnosis not present

## 2020-05-23 DIAGNOSIS — N179 Acute kidney failure, unspecified: Secondary | ICD-10-CM

## 2020-05-23 DIAGNOSIS — E878 Other disorders of electrolyte and fluid balance, not elsewhere classified: Secondary | ICD-10-CM | POA: Diagnosis not present

## 2020-05-23 DIAGNOSIS — J449 Chronic obstructive pulmonary disease, unspecified: Secondary | ICD-10-CM

## 2020-05-23 NOTE — Patient Instructions (Signed)
For back pain, may try some low heat with heating pad and or massage with Biofreeze. If back pain worsens may need to consider some sort of imaging study but I do not think this will be necessary.

## 2020-05-23 NOTE — Progress Notes (Signed)
Paramedicine Encounter    Patient ID: Brent [name], male    DOB: 09/29/1945, 75 y.o.   MRN: [id number]   Patient Care Team: Wardell Honour, MD as PCP - General (Family Medicine) Martinique, Peter M, MD as PCP - Cardiology (Cardiology) Larey Dresser, MD as PCP - Advanced Heart Failure (Cardiology) Elmarie Shiley, MD (Nephrology) Milus Banister, MD (Gastroenterology)  Patient Active Problem List   Diagnosis Date Noted  . Electrolyte abnormality 05/17/2020  . ICD (implantable cardioverter-defibrillator) in place 04/12/2020  . Chronic systolic heart failure ([place]) 01/02/2020  . Decreased appetite 10/08/2019  . Weakness 10/08/2019  . Hyperkalemia 08/26/2019  . Chronic systolic CHF (congestive heart failure) ([place]) 08/26/2019  . Type 2 diabetes mellitus with stage 3 chronic kidney disease ([place]) 08/26/2019  . Chronic obstructive pulmonary disease ([place]) 06/20/2019  . Ventricular tachycardia ([place]) 06/20/2019  . Acute on chronic systolic (congestive) heart failure ([place]) 04/25/2019  . Metabolic acidosis 49/44/9675  . Orthostasis 09/07/2018  . AKI (acute kidney injury) ([place])   . Immunosuppressed status ([place])   . Macrocytic anemia   . Chronic combined systolic and diastolic congestive heart failure ([place]) 06/03/2018  . Candida esophagitis ([place]) 09/22/2017  . History of smoking 30 or more pack years 01/05/2017  . Bipolar 1 disorder ([place])   . BPH (benign prostatic hyperplasia) 03/31/2013  . 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 Essential hypertension, benign 03/31/2013  . Acute renal failure superimposed on stage 3 chronic kidney disease ([place]) 06/04/2012  . 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[place]) 01/23/2010    Current Outpatient Medications:  .  acetaminophen (TYLENOL) 160 MG/5ML liquid, Take 325 mg by mouth every 4 (four) hours as needed for fever., Disp: , Rfl:  .  allopurinol (ZYLOPRIM) 100 MG tablet, Take 2 tablets (200 mg total) by mouth daily., Disp: 60 tablet, Rfl: 5 .  apixaban  (ELIQUIS) 5 MG TABS tablet, Take 1 tablet (5 mg total) by mouth 2 (two) times daily., Disp: 180 tablet, Rfl: 3 .  BUDESONIDE PO, Take 9 mg by mouth daily., Disp: , Rfl:  .  Calcium 500 MG tablet, Take 1 tablet (500 mg total) by mouth 1 day or 1 dose for 1 dose., Disp: 30 tablet, Rfl:  .  Cholecalciferol (VITAMIN D) 125 MCG (5000 UT) CAPS, Take 5,000 Units by mouth daily. , Disp: , Rfl:  .  CORLANOR 5 MG TABS tablet, TAKE 1 TABLET(5 MG) BY MOUTH TWICE DAILY WITH A MEAL, Disp: 180 tablet, Rfl: 3 .  dapagliflozin propanediol (FARXIGA) 10 MG TABS tablet, Take 1 tablet (10 mg total) by mouth daily before breakfast., Disp: 90 tablet, Rfl: 3 .  divalproex (DEPAKOTE ER) 500 MG 24 hr tablet, Take 1,500 mg by mouth at bedtime. , Disp: , Rfl:  .  Ensure (ENSURE), Take 1 Can by mouth 3 (three) times daily., Disp: , Rfl:  .  epoetin alfa (EPOGEN) 10000 UNIT/ML injection, Inject 10,000 Units into the skin every 30 (thirty) days., Disp: , Rfl:  .  ferrous sulfate 325 (65 FE) MG tablet, Take 325 mg by mouth in the morning and at bedtime., Disp: , Rfl:  .  furosemide (LASIX) 20 MG tablet, Take 1 tablet (20 mg total) by mouth every Monday, Wednesday, and Friday., Disp: 30 tablet, Rfl: 0 .  hydrALAZINE (APRESOLINE) 25 MG tablet, Take 25 mg by mouth 3 (three) times daily., Disp: , Rfl:  .  inFLIXimab (REMICADE) 100 MG injection, Infuse Remicade IV schedule 1 23m/kg every 8 weeks Premedicate  with Tylenol 500-644m by mouth and Benadryl 25-535mby mouth prior to infusion. Last PPD was on 12/2009. , Disp: 1 each, Rfl: 6 .  isosorbide mononitrate (IMDUR) 60 MG 24 hr tablet, Take 1 tablet (60 mg total) by mouth daily., Disp: 90 tablet, Rfl: 3 .  loperamide (IMODIUM) 2 MG capsule, Take 2 mg by mouth 3 (three) times daily as needed for diarrhea or loose stools., Disp: , Rfl:  .  Magnesium 400 MG TABS, Take 400 mg by mouth daily., Disp: 30 tablet, Rfl: 1 .  metoprolol succinate (TOPROL-XL) 25 MG 24 hr tablet, Take 1 tablet (25  mg total) by mouth in the morning and at bedtime., Disp: 180 tablet, Rfl: 6 .  mirtazapine (REMERON) 7.5 MG tablet, TAKE 1 TABLET(7.5 MG) BY MOUTH AT BEDTIME, Disp: 30 tablet, Rfl: 3 .  omeprazole (PRILOSEC) 20 MG capsule, Take 20 mg by mouth daily., Disp: , Rfl:  .  prazosin (MINIPRESS) 1 MG capsule, Take 1 mg by mouth at bedtime., Disp: , Rfl:  .  QUEtiapine (SEROQUEL) 100 MG tablet, Take 150 mg by mouth at bedtime., Disp: , Rfl:  .  solifenacin (VESICARE) 5 MG tablet, Take 5 mg by mouth daily., Disp: , Rfl:  .  tamsulosin (FLOMAX) 0.4 MG CAPS capsule, Take 1 capsule (0.4 mg total) by mouth daily., Disp: 30 capsule, Rfl: 2 .  vitamin B-12 (CYANOCOBALAMIN) 500 MCG tablet, Take 1,000 mcg by mouth daily. , Disp: , Rfl:  Allergies  Allergen Reactions  . Azathioprine Other (See Comments)    REACTION: affected WBC "Almost died"  . Ciprofloxacin Other (See Comments)    Reaction not recalled  . Levaquin [Levofloxacin In D5w] Other (See Comments)    Reaction not rec  . Plendil [Felodipine] Other (See Comments)    Reaction not not recalled     Social History   Socioeconomic History  . Marital status: Divorced    Spouse name: Not on file  . Number of children: 1  . Years of education: 1239. Highest education level: Not on file  Occupational History  . Occupation: retired  . Occupation: Veteran  Tobacco Use  . Smoking status: Former Smoker    Packs/day: 1.00    Years: 49.00    Pack years: 49.00    Types: Cigarettes    Start date: 04/11/1956    Quit date: 04/17/2014    Years since quitting: 6.1  . Smokeless tobacco: Never Used  Vaping Use  . Vaping Use: Never used  Substance and Sexual Activity  . Alcohol use: No    Alcohol/week: 0.0 standard drinks  . Drug use: No  . Sexual activity: Never  Other Topics Concern  . Not on file  Social History Narrative  . Not on file   Social Determinants of Health   Financial Resource Strain: Not on file  Food Insecurity: Not on file   Transportation Needs: Not on file  Physical Activity: Not on file  Stress: Not on file  Social Connections: Not on file  Intimate Partner Violence: Not on file    Physical Exam Vitals reviewed.  Constitutional:      Appearance: Normal appearance. He is normal weight.  HENT:     Head: Normocephalic.     Nose: Nose normal.     Mouth/Throat:     Mouth: Mucous membranes are moist.     Pharynx: Oropharynx is clear.  Eyes:     Conjunctiva/sclera: Conjunctivae normal.     Pupils: Pupils are  equal, round, and reactive to light.  Cardiovascular:     Rate and Rhythm: Normal rate and regular rhythm.     Pulses: Normal pulses.     Heart sounds: Normal heart sounds.  Pulmonary:     Effort: Pulmonary effort is normal.     Breath sounds: Normal breath sounds.  Abdominal:     General: Abdomen is flat.     Palpations: Abdomen is soft.  Musculoskeletal:        General: Normal range of motion.     Right lower leg: No edema.     Left lower leg: No edema.  Skin:    General: Skin is warm and dry.     Capillary Refill: Capillary refill takes less than 2 seconds.  Neurological:     General: No focal deficit present.     Mental Status: He is alert. Mental status is at baseline.  Psychiatric:        Mood and Affect: Mood normal.     Arrived for home visit for Brent [name] who was alert and oriented reporting to be feeling good but having some lower back pain bilaterally around the flank area. Brent [name] reports he is being seen by his doctors for same. I completed assessment and vitals. No edema or swelling noted. Talking scale successfully delivered. Weight today 179lbs. I reviewed medications and and confirmed same filling one pill box accordingly.   I spoke to [facility] and confirmed medications on their list and ours and the following is not on their list but is on ours.  -Eliquis -Corlanor -Farxiga -Mirtazapine -Solfenancin   The following medications are prescribed by physcians outside of  the [place] and these medications require approval from his PCP Dr. Barbaraann Rondo with the [facility] before they can be sent out via [facility].  I spoke to this PCP's RN and she reports we just need to fax over the RX with an office note confirming reason for patient being prescribed the medication and they will review it and have it sent in to their pharmacy pending confirmation. Myself and Tammy Sours are working on this.   Home visit complete. I will see Brent [name] in one week in the home.   - I will also assist in getting Brent [name] rescheduled with Dr. [name] due to recent hospital stay.   Future Appointments  Date Time Provider [place]  05/25/2020 10:00 AM [facility] RM 1 [facility] None  06/26/2020  2:00 PM Wardell Honour, MD [facility] None  07/03/2020  7:00 AM [facility] DEVICE REMOTES [facility]  10/02/2020  7:00 AM [facility] DEVICE REMOTES [facility]  01/01/2021  7:00 AM [facility] DEVICE REMOTES [facility]  01/29/2021  1:00 PM [name], Dinah C, NP P[facility] None  04/02/2021  7:00 AM [facility] DEVICE REMOTES [facility]  07/02/2021  7:00 AM [facility] DEVICE REMOTES [facility]     ACTION: Home visit completed Next visit planned for one week

## 2020-05-23 NOTE — Progress Notes (Signed)
Location:  [facility]  Provider: Alain Honey, MD  Code Status: Full Goals of Care:  Advanced Directives 05/23/2020  Does Patient Have a Medical Advance Directive? Yes  Type of Advance Directive Out of facility DNR (pink MOST or yellow form)  Does patient want to make changes to medical advance directive? No - Patient declined  Copy of [place] in Chart? -  Would patient like information on creating a medical advance directive? -  Pre-existing out of facility DNR order (yellow form or pink MOST form) Pink MOST form placed in chart (order not valid for inpatient use)     Chief Complaint  Patient presents with  . [facility] follow up visit. Patient admitted on Thursday 05/17/2020.Magnesium, potassium and calcium levels were low.[facility] told patient to go to ER because of blood work results. Patient states he has severe back pain. Pain is so intense at times that he wants to cry.Back pain started Thursday of last week. Happen more frequently. Discuss need for Hemoglobin A1C    HPI: Patient is a 74 y.o. male seen today for medical management of chronic diseases.  This visit is for transitions of care.  He was hospitalized 3 24-3 26 for hypomagnesemia and hypocalcemia.  He had been to the [place] [facility] in [place] earlier for a routine follow-up appointment he was asymptomatic.  Blood work showed these deficiencies of magnesium and calcium and he was referred to the hospital for treatment.  He does have a history of diuretic use as well as chronic kidney disease as well as chronic diarrhea I suspect a combination of these chronic issues led to the electrolyte abnormality. He does complain of back pain that started when he was in the hospital.  He denies any radiation to his legs.  He denies kidney symptoms.  The pain does localize more in the left flank area.  Past Medical History:  Diagnosis Date  . Anemia   . Anxiety   . Benign paroxysmal positional  vertigo   . Bipolar I disorder, most recent episode (or current) unspecified   . Celiac disease   . CHF (congestive heart failure) ([place])   . Chronic kidney disease, stage III (moderate) (HCC)   . Crohn's    Remicade q8 weeks  . Depression   . Essential and other specified forms of tremor    medication-induced Parkinson's, now resolved  . Essential hypertension, benign   . Gout 2018  . Heart failure ([place])   . Hypertrophy of prostate without urinary obstruction and other lower urinary tract symptoms (LUTS)   . Impotence of organic origin   . Insomnia with sleep apnea, unspecified   . Iron deficiency anemia, unspecified   . 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 Neuralgia, neuritis, and radiculitis, unspecified   . Other B-complex deficiencies   . Other extrapyramidal disease and abnormal movement disorder   . Postinflammatory pulmonary fibrosis ([place])   . Tobacco use disorder   . Vertigo 2018    Past Surgical History:  Procedure Laterality Date  . CATARACT EXTRACTION, BILATERAL Bilateral 09/2018  . CHOLECYSTECTOMY  07-12-2010  . ICD IMPLANT N/A 01/02/2020   Procedure: ICD IMPLANT;  Surgeon: Evans Lance, MD;  Location: [facility];  Service: Cardiovascular;  Laterality: N/A;  . RIGHT HEART CATH N/A 10/12/2018   Procedure: RIGHT HEART CATH;  Surgeon: Jolaine Artist, MD;  Location: [facility];  Service: Cardiovascular;  Laterality: N/A;  . RIGHT/LEFT HEART  CATH AND CORONARY ANGIOGRAPHY N/A 04/26/2019   Procedure: RIGHT/LEFT HEART CATH AND CORONARY ANGIOGRAPHY;  Surgeon: Larey Dresser, MD;  Location: [facility];  Service: Cardiovascular;  Laterality: N/A;  . SMALL INTESTINE SURGERY     x 2    Allergies  Allergen Reactions  . Azathioprine Other (See Comments)    REACTION: affected WBC "Almost died"  . Ciprofloxacin Other (See Comments)    Reaction not recalled  . Levaquin [Levofloxacin In D5w] Other (See Comments)    Reaction not rec  . Plendil [Felodipine]  Other (See Comments)    Reaction not not recalled    Allergies as of 05/23/2020      Reactions   Azathioprine Other (See Comments)   REACTION: affected WBC "Almost died"   Ciprofloxacin Other (See Comments)   Reaction not recalled   Levaquin [levofloxacin In D5w] Other (See Comments)   Reaction not rec   Plendil [felodipine] Other (See Comments)   Reaction not not recalled      Medication List       Accurate as of May 23, 2020  9:24 AM. If you have any questions, ask your nurse or doctor.        acetaminophen 160 MG/5ML liquid Commonly known as: TYLENOL Take 325 mg by mouth every 4 (four) hours as needed for fever.   allopurinol 100 MG tablet Commonly known as: ZYLOPRIM Take 2 tablets (200 mg total) by mouth daily.   apixaban 5 MG Tabs tablet Commonly known as: ELIQUIS Take 1 tablet (5 mg total) by mouth 2 (two) times daily.   BUDESONIDE PO Take 9 mg by mouth daily.   Calcium 500 MG tablet Take 1 tablet (500 mg total) by mouth 1 day or 1 dose for 1 dose.   Corlanor 5 MG Tabs tablet Generic drug: ivabradine TAKE 1 TABLET(5 MG) BY MOUTH TWICE DAILY WITH A MEAL   dapagliflozin propanediol 10 MG Tabs tablet Commonly known as: Farxiga Take 1 tablet (10 mg total) by mouth daily before breakfast.   Depakote ER 500 MG 24 hr tablet Generic drug: divalproex Take 1,500 mg by mouth at bedtime.   Ensure Take 1 Can by mouth 3 (three) times daily.   epoetin alfa 10000 UNIT/ML injection Commonly known as: EPOGEN Inject 10,000 Units into the skin every 30 (thirty) days.   ferrous sulfate 325 (65 FE) MG tablet Take 325 mg by mouth in the morning and at bedtime.   furosemide 20 MG tablet Commonly known as: LASIX Take 1 tablet (20 mg total) by mouth every Monday, Wednesday, and Friday.   hydrALAZINE 25 MG tablet Commonly known as: APRESOLINE Take 25 mg by mouth 3 (three) times daily.   inFLIXimab 100 MG injection Commonly known as: Remicade Infuse Remicade IV  schedule 1 71m/kg every 8 weeks Premedicate with Tylenol 500-658mby mouth and Benadryl 25-5028my mouth prior to infusion. Last PPD was on 12/2009.   isosorbide mononitrate 60 MG 24 hr tablet Commonly known as: IMDUR Take 1 tablet (60 mg total) by mouth daily.   loperamide 2 MG capsule Commonly known as: IMODIUM Take 2 mg by mouth 3 (three) times daily as needed for diarrhea or loose stools.   Magnesium 400 MG Tabs Take 400 mg by mouth daily.   metoprolol succinate 25 MG 24 hr tablet Commonly known as: TOPROL-XL Take 1 tablet (25 mg total) by mouth in the morning and at bedtime.   mirtazapine 7.5 MG tablet Commonly known as: REMERON TAKE  1 TABLET(7.5 MG) BY MOUTH AT BEDTIME   omeprazole 20 MG capsule Commonly known as: PRILOSEC Take 20 mg by mouth daily.   prazosin 1 MG capsule Commonly known as: MINIPRESS Take 1 mg by mouth at bedtime.   QUEtiapine 100 MG tablet Commonly known as: SEROQUEL Take 150 mg by mouth at bedtime.   solifenacin 5 MG tablet Commonly known as: VESICARE Take 5 mg by mouth daily.   tamsulosin 0.4 MG Caps capsule Commonly known as: FLOMAX Take 1 capsule (0.4 mg total) by mouth daily.   vitamin B-12 500 MCG tablet Commonly known as: CYANOCOBALAMIN Take 1,000 mcg by mouth daily.   Vitamin D 125 MCG (5000 UT) Caps Take 5,000 Units by mouth daily.       Review of Systems:  Review of Systems  Musculoskeletal: Positive for back pain.  All other systems reviewed and are negative.   Health Maintenance  Topic Date Due  . HEMOGLOBIN A1C  02/27/2020  . FOOT EXAM  08/09/2020  . OPHTHALMOLOGY EXAM  10/12/2020  . TETANUS/TDAP  05/25/2021  . COLONOSCOPY (Pts 45-24yr Insurance coverage will need to be confirmed)  09/17/2027  . INFLUENZA VACCINE  Completed  . COVID-19 Vaccine  Completed  . Hepatitis C Screening  Completed  . PNA vac Low Risk Adult  Completed  . HPV VACCINES  Aged Out    Physical Exam: Vitals:   05/23/20 0920  BP:  120/90  Pulse: 80  Temp: 99.1 F (37.3 C)  SpO2: 96%  Weight: 180 lb 3.2 oz (81.7 kg)  Height: 6' 4"  (1.93 m)   Body mass index is 21.93 kg/m. Physical Exam Vitals and nursing note reviewed.  Constitutional:      Appearance: Normal appearance.  HENT:     Head: Normocephalic.  Eyes:     Pupils: Pupils are equal, round, and reactive to light.  Cardiovascular:     Rate and Rhythm: Normal rate and regular rhythm.  Pulmonary:     Effort: Pulmonary effort is normal.     Breath sounds: Normal breath sounds.  Abdominal:     General: Abdomen is flat. Bowel sounds are normal.  Musculoskeletal:        General: Normal range of motion.     Cervical back: Normal range of motion.     Comments: Back pain localizes in the left flank area Straight leg raising negative Deep tendon reflexes are symmetric at the knees Normal strength in lower extremities  Skin:    General: Skin is warm and dry.  Neurological:     Mental Status: He is alert.     Labs reviewed: Basic Metabolic Panel: Recent Labs    08/27/19 0201 08/28/19 0323 09/06/19 1559 09/28/19 0946 10/20/19 1158 11/14/19 1413 05/17/20 1814 05/18/20 0524 05/18/20 1451 05/19/20 0234  NA 140 138   < > 141 142   < > 141 141 140 142  K 5.5* 5.3*   < > 5.2 5.7*   < > 2.7* 2.8* 3.0* 3.5  CL 107 103   < > 114* 112*   < > 108 112* 112* 114*  CO2 25 27   < > 20 27   < > 25 20* 22 20*  GLUCOSE 127* 97   < > 102* 78   < > 84 74 116* 87  BUN 36* 34*   < > 42* 27*   < > 27* 24* 22 21  CREATININE 2.52* 2.57*   < > 2.55* 2.08*   < >  2.21* 1.78* 1.99* 1.80*  CALCIUM 8.4* 8.6*   < > 8.9 9.2   < > 6.4* 6.2*  6.1 6.8* 7.2*  MG 1.8 1.6*   < >  --   --   --  0.6* 0.9* 1.3*  --   PHOS 4.4 3.9  --   --   --   --   --   --   --   --   TSH  --   --   --  1.22 2.46  --   --   --   --   --    < > = values in this interval not displayed.   Liver Function Tests: Recent Labs    08/27/19 0201 08/28/19 0323 09/06/19 1559 09/28/19 0946  10/20/19 1158 05/18/20 0524  AST 11* 11*   < > 8* 11 11*  11*  ALT 9 10   < > 5* 8* 10  9  ALKPHOS 56 57  --   --   --  43  44  BILITOT 0.4 0.4   < > 0.3 0.3 0.6  0.5  PROT 5.6* 6.2*   < > 6.7 6.7 5.9*  5.9*  ALBUMIN 2.8* 3.0*  --   --   --  2.3*  2.3*   < > = values in this interval not displayed.   No results for input(s): LIPASE, AMYLASE in the last 8760 hours. No results for input(s): AMMONIA in the last 8760 hours. CBC: Recent Labs    12/30/19 1025 01/16/20 1015 05/17/20 1814 05/18/20 0524 05/19/20 0234  WBC 10.7  --  11.1* 9.4 9.5  NEUTROABS 6.1  --  6.8 5.6  --   HGB 12.2*   < > 11.0* 9.8* 10.0*  HCT 37.1*   < > 35.5* 30.7* 31.8*  MCV 101*  --  107.6* 104.4* 103.9*  PLT 177  --  133* 123* 130*   < > = values in this interval not displayed.   Lipid Panel: Recent Labs    10/20/19 1158  CHOL 161  HDL 54  LDLCALC 86  TRIG 112  CHOLHDL 3.0   Lab Results  Component Value Date   HGBA1C 5.1 08/27/2019    Procedures since last visit: No results found.  Assessment/Plan There are no diagnoses linked to this encounter. 1. Full code status  - Full code  2. Essential hypertension, benign Blood pressure is good today at 120/90.  Continue treatment as before including metoprolol hydralazine and furosemide  3. Chronic obstructive pulmonary disease, unspecified COPD type ([place]) Breathing easily.  There are no wheezes or rhonchi.  Continue with budesonide  4. Crohn's disease of small intestine without complication ([place]) He basically takes antidiarrhea medicine chronically but continues to have diarrhea and as noted above this may be the source of electrolyte loss which necessitated replacement with this hospitalization   6. Hypomagnesemia Magnesium repleted in the hospital and he is discharged on magnesium 400 mg a day.  Will recheck in 1 month  7. Electrolyte abnormality Low levels of magnesium and calcium potassium necessitated hospitalization.  Reasons  are probably multifactorial including chronic diarrhea diuretic therapy and chronic kidney disease.  We will plan to recheck in 1 month and continue supplementation until then   Labs/tests ordered: None today Next appt: 1 month   Pastor Sgro M. Sabra Heck, [place] [address] [place], [place] Office [phone]

## 2020-05-24 DIAGNOSIS — N184 Chronic kidney disease, stage 4 (severe): Secondary | ICD-10-CM | POA: Diagnosis not present

## 2020-05-24 DIAGNOSIS — I129 Hypertensive chronic kidney disease with stage 1 through stage 4 chronic kidney disease, or unspecified chronic kidney disease: Secondary | ICD-10-CM | POA: Diagnosis not present

## 2020-05-24 DIAGNOSIS — D631 Anemia in chronic kidney disease: Secondary | ICD-10-CM | POA: Diagnosis not present

## 2020-05-24 DIAGNOSIS — N2581 Secondary hyperparathyroidism of renal origin: Secondary | ICD-10-CM | POA: Diagnosis not present

## 2020-05-25 ENCOUNTER — Other Ambulatory Visit: Payer: Self-pay

## 2020-05-25 ENCOUNTER — Non-Acute Institutional Stay (HOSPITAL_COMMUNITY)
Admission: RE | Admit: 2020-05-25 | Discharge: 2020-05-25 | Disposition: A | Discharge status: Home / Self Care | Payer: Medicare Other | Source: Ambulatory Visit | Attending: Internal Medicine | Admitting: Internal Medicine

## 2020-05-25 ENCOUNTER — Telehealth (HOSPITAL_COMMUNITY): Payer: Self-pay | Admitting: Licensed Clinical Social Worker

## 2020-05-25 ENCOUNTER — Other Ambulatory Visit (HOSPITAL_COMMUNITY): Payer: Self-pay

## 2020-05-25 DIAGNOSIS — D631 Anemia in chronic kidney disease: Secondary | ICD-10-CM | POA: Insufficient documentation

## 2020-05-25 DIAGNOSIS — N189 Chronic kidney disease, unspecified: Secondary | ICD-10-CM | POA: Diagnosis not present

## 2020-05-25 LAB — IRON AND TIBC
Iron: 36 ug/dL — ABNORMAL LOW (ref 45–182)
Saturation Ratios: 17 % — ABNORMAL LOW (ref 17.9–39.5)
TIBC: 211 ug/dL — ABNORMAL LOW (ref 250–450)
UIBC: 175 ug/dL

## 2020-05-25 LAB — HEMOGLOBIN AND HEMATOCRIT, BLOOD
HCT: 33.8 % — ABNORMAL LOW (ref 39.0–52.0)
Hemoglobin: 10.6 g/dL — ABNORMAL LOW (ref 13.0–17.0)

## 2020-05-25 LAB — FERRITIN: Ferritin: 668 ng/mL — ABNORMAL HIGH (ref 24–336)

## 2020-05-25 MED ORDER — IVABRADINE HCL 5 MG PO TABS: ORAL_TABLET | ORAL | 3 refills | Status: DC

## 2020-05-25 MED ORDER — DAPAGLIFLOZIN PROPANEDIOL 10 MG PO TABS: 10.0000 mg | ORAL_TABLET | Freq: Every day | ORAL | 3 refills | Status: DC

## 2020-05-25 MED ORDER — EPOETIN ALFA 10000 UNIT/ML IJ SOLN
10000.0000 [IU] | INTRAMUSCULAR | Status: DC
  Administered 2020-05-25: 10000 [IU] via SUBCUTANEOUS
  Filled 2020-05-25: qty 1

## 2020-05-25 MED ORDER — APIXABAN 5 MG PO TABS: 5.0000 mg | ORAL_TABLET | Freq: Two times a day (BID) | ORAL | 3 refills | Status: DC

## 2020-05-25 NOTE — Progress Notes (Signed)
PATIENT CARE CENTER NOTE  Diagnosis:Anemia associated with Chronic Renal Failure, anemia associated with renal disease   Provider:Patel, Ulice Dash MD   Procedure:Epoetin Alfa (Procrit) injection   Note:Patient received sub-qProcritinjection inleftarm. Tolerated well. Labs drawn pre-injection and Hemoglobin was10.6. Patient'sBP elevated but within parameters at 155/89. AVS offered but patient refused. Patient alert, oriented and ambulatoryat discharge.

## 2020-05-25 NOTE — Telephone Encounter (Signed)
[facility] requiring [facility] and documentation as to why pt was put on medications in order to get medications approved to be filled through the [facility].  CSW found MD notes from when pt was started on Corlanor, Farxiga, and Eliquis and faxed to [facility] for review- fax confirmation received  Will continue to follow and assist as needed  Jorge Ny, [place] Worker [facility] Desk#: [phone] Cell#: [phone]

## 2020-05-30 ENCOUNTER — Other Ambulatory Visit (HOSPITAL_COMMUNITY): Payer: Self-pay

## 2020-05-30 NOTE — Progress Notes (Signed)
Paramedicine Encounter    Patient ID: Brent [name], male    DOB: 1945-03-27, 75 y.o.   MRN: [id number]   Patient Care Team: Wardell Honour, MD as PCP - General (Family Medicine) Martinique, Peter M, MD as PCP - Cardiology (Cardiology) Larey Dresser, MD as PCP - Advanced Heart Failure (Cardiology) Elmarie Shiley, MD (Nephrology) Milus Banister, MD (Gastroenterology) Jorge Ny, LCSW as Social Worker (Licensed Clinical Social Worker)  Patient Active Problem List   Diagnosis Date Noted  . Electrolyte abnormality 05/17/2020  . ICD (implantable cardioverter-defibrillator) in place 04/12/2020  . Chronic systolic heart failure ([place]) 01/02/2020  . Decreased appetite 10/08/2019  . Weakness 10/08/2019  . Hyperkalemia 08/26/2019  . Chronic systolic CHF (congestive heart failure) ([place]) 08/26/2019  . Type 2 diabetes mellitus with stage 3 chronic kidney disease ([place]) 08/26/2019  . Chronic obstructive pulmonary disease ([place]) 06/20/2019  . Ventricular tachycardia ([place]) 06/20/2019  . Acute on chronic systolic (congestive) heart failure ([place]) 04/25/2019  . Metabolic acidosis 65/46/5035  . Orthostasis 09/07/2018  . AKI (acute kidney injury) ([place])   . Immunosuppressed status ([place])   . Macrocytic anemia   . Chronic combined systolic and diastolic congestive heart failure ([place]) 06/03/2018  . Candida esophagitis ([place]) 09/22/2017  . History of smoking 30 or more pack years 01/05/2017  . Bipolar 1 disorder ([place])   . BPH (benign prostatic hyperplasia) 03/31/2013  . 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 Essential hypertension, benign 03/31/2013  . Acute renal failure superimposed on stage 3 chronic kidney disease ([place]) 06/04/2012  . 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[place]) 01/23/2010    Current Outpatient Medications:  .  acetaminophen (TYLENOL) 160 MG/5ML liquid, Take 325 mg by mouth every 4 (four) hours as needed for fever., Disp: , Rfl:  .  allopurinol (ZYLOPRIM) 100 MG tablet, Take 2 tablets  (200 mg total) by mouth daily., Disp: 60 tablet, Rfl: 5 .  apixaban (ELIQUIS) 5 MG TABS tablet, Take 1 tablet (5 mg total) by mouth 2 (two) times daily., Disp: 180 tablet, Rfl: 3 .  BUDESONIDE PO, Take 9 mg by mouth daily., Disp: , Rfl:  .  Calcium 500 MG tablet, Take 1 tablet (500 mg total) by mouth 1 day or 1 dose for 1 dose., Disp: 30 tablet, Rfl:  .  Cholecalciferol (VITAMIN D) 125 MCG (5000 UT) CAPS, Take 5,000 Units by mouth daily. , Disp: , Rfl:  .  dapagliflozin propanediol (FARXIGA) 10 MG TABS tablet, Take 1 tablet (10 mg total) by mouth daily before breakfast., Disp: 90 tablet, Rfl: 3 .  divalproex (DEPAKOTE ER) 500 MG 24 hr tablet, Take 1,500 mg by mouth at bedtime. , Disp: , Rfl:  .  Ensure (ENSURE), Take 1 Can by mouth 3 (three) times daily. (Patient not taking: Reported on 05/23/2020), Disp: , Rfl:  .  epoetin alfa (EPOGEN) 10000 UNIT/ML injection, Inject 10,000 Units into the skin every 30 (thirty) days., Disp: , Rfl:  .  ferrous sulfate 325 (65 FE) MG tablet, Take 325 mg by mouth in the morning and at bedtime., Disp: , Rfl:  .  furosemide (LASIX) 20 MG tablet, Take 1 tablet (20 mg total) by mouth every Monday, Wednesday, and Friday., Disp: 30 tablet, Rfl: 0 .  hydrALAZINE (APRESOLINE) 25 MG tablet, Take 25 mg by mouth 3 (three) times daily., Disp: , Rfl:  .  inFLIXimab (REMICADE) 100 MG injection, Infuse Remicade IV schedule 1 57m/kg every 8 weeks Premedicate with Tylenol 500-6563mby mouth and  Benadryl 25-25m by mouth prior to infusion. Last PPD was on 12/2009. , Disp: 1 each, Rfl: 6 .  isosorbide mononitrate (IMDUR) 60 MG 24 hr tablet, Take 1 tablet (60 mg total) by mouth daily., Disp: 90 tablet, Rfl: 3 .  ivabradine (CORLANOR) 5 MG TABS tablet, TAKE 1 TABLET(5 MG) BY MOUTH TWICE DAILY WITH A MEAL, Disp: 180 tablet, Rfl: 3 .  loperamide (IMODIUM) 2 MG capsule, Take 2 mg by mouth 3 (three) times daily as needed for diarrhea or loose stools., Disp: , Rfl:  .  Magnesium 400 MG TABS,  Take 400 mg by mouth daily., Disp: 30 tablet, Rfl: 1 .  metoprolol succinate (TOPROL-XL) 25 MG 24 hr tablet, Take 1 tablet (25 mg total) by mouth in the morning and at bedtime., Disp: 180 tablet, Rfl: 6 .  mirtazapine (REMERON) 7.5 MG tablet, TAKE 1 TABLET(7.5 MG) BY MOUTH AT BEDTIME, Disp: 30 tablet, Rfl: 3 .  omeprazole (PRILOSEC) 20 MG capsule, Take 20 mg by mouth daily., Disp: , Rfl:  .  prazosin (MINIPRESS) 1 MG capsule, Take 1 mg by mouth at bedtime., Disp: , Rfl:  .  QUEtiapine (SEROQUEL) 100 MG tablet, Take 150 mg by mouth at bedtime., Disp: , Rfl:  .  solifenacin (VESICARE) 5 MG tablet, Take 5 mg by mouth daily., Disp: , Rfl:  .  tamsulosin (FLOMAX) 0.4 MG CAPS capsule, Take 1 capsule (0.4 mg total) by mouth daily., Disp: 30 capsule, Rfl: 2 .  vitamin B-12 (CYANOCOBALAMIN) 500 MCG tablet, Take 1,000 mcg by mouth daily. , Disp: , Rfl:  Allergies  Allergen Reactions  . Azathioprine Other (See Comments)    REACTION: affected WBC "Almost died"  . Ciprofloxacin Other (See Comments)    Reaction not recalled  . Levaquin [Levofloxacin In D5w] Other (See Comments)    Reaction not rec  . Plendil [Felodipine] Other (See Comments)    Reaction not not recalled     Social History   Socioeconomic History  . Marital status: Divorced    Spouse name: Not on file  . Number of children: 1  . Years of education: 179 . Highest education level: Not on file  Occupational History  . Occupation: retired  . Occupation: Veteran  Tobacco Use  . Smoking status: Former Smoker    Packs/day: 1.00    Years: 49.00    Pack years: 49.00    Types: Cigarettes    Start date: 04/11/1956    Quit date: 04/17/2014    Years since quitting: 6.1  . Smokeless tobacco: Never Used  Vaping Use  . Vaping Use: Never used  Substance and Sexual Activity  . Alcohol use: No    Alcohol/week: 0.0 standard drinks  . Drug use: No  . Sexual activity: Never  Other Topics Concern  . Not on file  Social History Narrative   . Not on file   Social Determinants of Health   Financial Resource Strain: Not on file  Food Insecurity: Not on file  Transportation Needs: Not on file  Physical Activity: Not on file  Stress: Not on file  Social Connections: Not on file  Intimate Partner Violence: Not on file    Physical Exam Vitals reviewed.  Constitutional:      Appearance: Normal appearance. He is normal weight.  HENT:     Head: Normocephalic.     Nose: Nose normal.     Mouth/Throat:     Mouth: Mucous membranes are moist.     Pharynx: Oropharynx  is clear.  Eyes:     Conjunctiva/sclera: Conjunctivae normal.     Pupils: Pupils are equal, round, and reactive to light.  Cardiovascular:     Rate and Rhythm: Normal rate and regular rhythm.     Pulses: Normal pulses.     Heart sounds: Normal heart sounds.  Pulmonary:     Effort: Pulmonary effort is normal.     Breath sounds: Normal breath sounds.  Abdominal:     General: Abdomen is flat.     Palpations: Abdomen is soft.  Musculoskeletal:        General: Normal range of motion.     Cervical back: Normal range of motion.     Right lower leg: No edema.     Left lower leg: No edema.  Skin:    General: Skin is warm and dry.     Capillary Refill: Capillary refill takes less than 2 seconds.  Neurological:     General: No focal deficit present.     Mental Status: He is alert. Mental status is at baseline.  Psychiatric:        Mood and Affect: Mood normal.     Arrived for home visit for Jimmy who was alert and oriented reporting to be feeling good denying any complaints. Nyaire has been compliant with his medications over the last week and has had no side effects reported. I obtained vitals and assessment as noted. I reviewed medications and filled one pill box for [name]. I also reviewed upcoming appointments with Laverna Peace and confirmed them in his planner. Home visit complete. I will see patient in one week.   NO REFILLS    Future Appointments  Date  Time Provider [place]  06/22/2020 10:00 AM [facility] RM 1 [facility] None  06/26/2020  2:00 PM Wardell Honour, MD [facility] None  07/03/2020  7:00 AM [facility] DEVICE REMOTES [facility]  10/02/2020  7:00 AM [facility] DEVICE REMOTES [facility]  01/01/2021  7:00 AM [facility] DEVICE REMOTES [facility]  01/29/2021  1:00 PM [name], Dinah C, NP [facility] None  04/02/2021  7:00 AM [facility] DEVICE REMOTES [facility]  07/02/2021  7:00 AM [facility] DEVICE REMOTES [facility]     ACTION: Home visit completed Next visit planned for ONE WEEK

## 2020-06-06 ENCOUNTER — Other Ambulatory Visit (HOSPITAL_COMMUNITY): Payer: Self-pay

## 2020-06-06 DIAGNOSIS — D631 Anemia in chronic kidney disease: Secondary | ICD-10-CM | POA: Diagnosis not present

## 2020-06-06 DIAGNOSIS — N184 Chronic kidney disease, stage 4 (severe): Secondary | ICD-10-CM | POA: Diagnosis not present

## 2020-06-06 DIAGNOSIS — N2581 Secondary hyperparathyroidism of renal origin: Secondary | ICD-10-CM | POA: Diagnosis not present

## 2020-06-06 DIAGNOSIS — I129 Hypertensive chronic kidney disease with stage 1 through stage 4 chronic kidney disease, or unspecified chronic kidney disease: Secondary | ICD-10-CM | POA: Diagnosis not present

## 2020-06-06 NOTE — Progress Notes (Signed)
Paramedicine Encounter    Patient ID: Brent [name], male    DOB: April 10, 1945, 75 y.o.   MRN: [id number]   Patient Care Team: Wardell Honour, MD as PCP - General (Family Medicine) Martinique, Peter M, MD as PCP - Cardiology (Cardiology) Larey Dresser, MD as PCP - Advanced Heart Failure (Cardiology) Elmarie Shiley, MD (Nephrology) Milus Banister, MD (Gastroenterology) Jorge Ny, LCSW as Social Worker (Licensed Clinical Social Worker)  Patient Active Problem List   Diagnosis Date Noted  . Electrolyte abnormality 05/17/2020  . ICD (implantable cardioverter-defibrillator) in place 04/12/2020  . Chronic systolic heart failure ([place]) 01/02/2020  . Decreased appetite 10/08/2019  . Weakness 10/08/2019  . Hyperkalemia 08/26/2019  . Chronic systolic CHF (congestive heart failure) ([place]) 08/26/2019  . Type 2 diabetes mellitus with stage 3 chronic kidney disease ([place]) 08/26/2019  . Chronic obstructive pulmonary disease ([place]) 06/20/2019  . Ventricular tachycardia ([place]) 06/20/2019  . Acute on chronic systolic (congestive) heart failure ([place]) 04/25/2019  . Metabolic acidosis 76/22/6333  . Orthostasis 09/07/2018  . AKI (acute kidney injury) ([place])   . Immunosuppressed status ([place])   . Macrocytic anemia   . Chronic combined systolic and diastolic congestive heart failure ([place]) 06/03/2018  . Candida esophagitis ([place]) 09/22/2017  . History of smoking 30 or more pack years 01/05/2017  . Bipolar 1 disorder ([place])   . BPH (benign prostatic hyperplasia) 03/31/2013  . 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 Essential hypertension, benign 03/31/2013  . Acute renal failure superimposed on stage 3 chronic kidney disease ([place]) 06/04/2012  . 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[place]) 01/23/2010    Current Outpatient Medications:  .  acetaminophen (TYLENOL) 160 MG/5ML liquid, Take 325 mg by mouth every 4 (four) hours as needed for fever., Disp: , Rfl:  .  allopurinol (ZYLOPRIM) 100 MG tablet, Take 2 tablets  (200 mg total) by mouth daily., Disp: 60 tablet, Rfl: 5 .  apixaban (ELIQUIS) 5 MG TABS tablet, Take 1 tablet (5 mg total) by mouth 2 (two) times daily., Disp: 180 tablet, Rfl: 3 .  BUDESONIDE PO, Take 9 mg by mouth daily., Disp: , Rfl:  .  Calcium 500 MG tablet, Take 1 tablet (500 mg total) by mouth 1 day or 1 dose for 1 dose., Disp: 30 tablet, Rfl:  .  Cholecalciferol (VITAMIN D) 125 MCG (5000 UT) CAPS, Take 5,000 Units by mouth daily. , Disp: , Rfl:  .  dapagliflozin propanediol (FARXIGA) 10 MG TABS tablet, Take 1 tablet (10 mg total) by mouth daily before breakfast., Disp: 90 tablet, Rfl: 3 .  divalproex (DEPAKOTE ER) 500 MG 24 hr tablet, Take 1,500 mg by mouth at bedtime. , Disp: , Rfl:  .  Ensure (ENSURE), Take 1 Can by mouth 3 (three) times daily., Disp: , Rfl:  .  epoetin alfa (EPOGEN) 10000 UNIT/ML injection, Inject 10,000 Units into the skin every 30 (thirty) days., Disp: , Rfl:  .  ferrous sulfate 325 (65 FE) MG tablet, Take 325 mg by mouth in the morning and at bedtime., Disp: , Rfl:  .  furosemide (LASIX) 20 MG tablet, Take 1 tablet (20 mg total) by mouth every Monday, Wednesday, and Friday., Disp: 30 tablet, Rfl: 0 .  hydrALAZINE (APRESOLINE) 25 MG tablet, Take 25 mg by mouth 3 (three) times daily. (Patient not taking: Reported on 05/30/2020), Disp: , Rfl:  .  inFLIXimab (REMICADE) 100 MG injection, Infuse Remicade IV schedule 1 74m/kg every 8 weeks Premedicate with Tylenol 500-6521mby mouth and  Benadryl 25-88m by mouth prior to infusion. Last PPD was on 12/2009. , Disp: 1 each, Rfl: 6 .  isosorbide mononitrate (IMDUR) 60 MG 24 hr tablet, Take 1 tablet (60 mg total) by mouth daily., Disp: 90 tablet, Rfl: 3 .  ivabradine (CORLANOR) 5 MG TABS tablet, TAKE 1 TABLET(5 MG) BY MOUTH TWICE DAILY WITH A MEAL, Disp: 180 tablet, Rfl: 3 .  loperamide (IMODIUM) 2 MG capsule, Take 2 mg by mouth 3 (three) times daily as needed for diarrhea or loose stools., Disp: , Rfl:  .  Magnesium 400 MG TABS,  Take 400 mg by mouth daily., Disp: 30 tablet, Rfl: 1 .  metoprolol succinate (TOPROL-XL) 25 MG 24 hr tablet, Take 1 tablet (25 mg total) by mouth in the morning and at bedtime., Disp: 180 tablet, Rfl: 6 .  mirtazapine (REMERON) 7.5 MG tablet, TAKE 1 TABLET(7.5 MG) BY MOUTH AT BEDTIME, Disp: 30 tablet, Rfl: 3 .  omeprazole (PRILOSEC) 20 MG capsule, Take 20 mg by mouth daily., Disp: , Rfl:  .  prazosin (MINIPRESS) 1 MG capsule, Take 1 mg by mouth at bedtime. (Patient not taking: Reported on 05/30/2020), Disp: , Rfl:  .  QUEtiapine (SEROQUEL) 100 MG tablet, Take 150 mg by mouth at bedtime., Disp: , Rfl:  .  solifenacin (VESICARE) 5 MG tablet, Take 5 mg by mouth daily., Disp: , Rfl:  .  tamsulosin (FLOMAX) 0.4 MG CAPS capsule, Take 1 capsule (0.4 mg total) by mouth daily., Disp: 30 capsule, Rfl: 2 .  vitamin B-12 (CYANOCOBALAMIN) 500 MCG tablet, Take 1,000 mcg by mouth daily. , Disp: , Rfl:  Allergies  Allergen Reactions  . Azathioprine Other (See Comments)    REACTION: affected WBC "Almost died"  . Ciprofloxacin Other (See Comments)    Reaction not recalled  . Levaquin [Levofloxacin In D5w] Other (See Comments)    Reaction not rec  . Plendil [Felodipine] Other (See Comments)    Reaction not not recalled     Social History   Socioeconomic History  . Marital status: Divorced    Spouse name: Not on file  . Number of children: 1  . Years of education: 136 . Highest education level: Not on file  Occupational History  . Occupation: retired  . Occupation: Veteran  Tobacco Use  . Smoking status: Former Smoker    Packs/day: 1.00    Years: 49.00    Pack years: 49.00    Types: Cigarettes    Start date: 04/11/1956    Quit date: 04/17/2014    Years since quitting: 6.1  . Smokeless tobacco: Never Used  Vaping Use  . Vaping Use: Never used  Substance and Sexual Activity  . Alcohol use: No    Alcohol/week: 0.0 standard drinks  . Drug use: No  . Sexual activity: Never  Other Topics Concern   . Not on file  Social History Narrative  . Not on file   Social Determinants of Health   Financial Resource Strain: Not on file  Food Insecurity: Not on file  Transportation Needs: Not on file  Physical Activity: Not on file  Stress: Not on file  Social Connections: Not on file  Intimate Partner Violence: Not on file    Physical Exam Vitals reviewed.  Constitutional:      Appearance: Normal appearance. He is normal weight.  HENT:     Head: Normocephalic.     Nose: Nose normal.     Mouth/Throat:     Mouth: Mucous membranes are moist.  Pharynx: Oropharynx is clear.  Eyes:     Pupils: Pupils are equal, round, and reactive to light.  Cardiovascular:     Rate and Rhythm: Normal rate and regular rhythm.     Pulses: Normal pulses.     Heart sounds: Normal heart sounds.  Pulmonary:     Effort: Pulmonary effort is normal.     Breath sounds: Normal breath sounds.  Abdominal:     General: Abdomen is flat.     Palpations: Abdomen is soft.  Musculoskeletal:        General: Normal range of motion.     Cervical back: Normal range of motion.     Right lower leg: No edema.     Left lower leg: No edema.  Skin:    General: Skin is warm and dry.     Capillary Refill: Capillary refill takes less than 2 seconds.  Neurological:     General: No focal deficit present.     Mental Status: He is alert. Mental status is at baseline.  Psychiatric:        Mood and Affect: Mood normal.      Arrived for home visit for Brent [name] who reports he is feeling good and has no complaints. Brent [name] reports his PCP at the [place] started him on 1/2 tablet of 92m Jardiance daily. I added same to med list. I reviewed medications and filled two pill boxes for Mr. HElnoria [name] I obtained vitals and assessment as noted. JJeneen Rinksand I reviewed upcoming appointments and he confirmed them in his planner. Home visit complete.   [place] [name] [facility] and I will assist in setting this up next  visit.   Refills: Remeron Flomax Seroquel      Future Appointments  Date Time Provider [place] 06/22/2020 10:00 AM [facility] RM 1 [facility] None  06/26/2020  2:00 PM MWardell Honour MD [facility] None  07/03/2020  7:00 AM [facility] DEVICE REMOTES [facility]  10/02/2020  7:00 AM [facility] DEVICE REMOTES [facility]  01/01/2021  7:00 AM [facility] DEVICE REMOTES [facility]  01/29/2021  1:00 PM [name], Dinah C, NP [facility] None  04/02/2021  7:00 AM [facility] DEVICE REMOTES [facility]  07/02/2021  7:00 AM [facility] DEVICE REMOTES [facility]     ACTION: Home visit completed Next visit planned for two weeks

## 2020-06-19 ENCOUNTER — Other Ambulatory Visit (HOSPITAL_COMMUNITY): Payer: Self-pay | Admitting: Internal Medicine

## 2020-06-20 ENCOUNTER — Other Ambulatory Visit (HOSPITAL_COMMUNITY): Payer: Self-pay

## 2020-06-20 NOTE — Progress Notes (Signed)
Paramedicine Encounter    Patient ID: Brent [name], male    DOB: Jul 07, 1945, 75 y.o.   MRN: [id number]   Patient Care Team: Wardell Honour, MD as PCP - General (Family Medicine) [place], Peter M, MD as PCP - Cardiology (Cardiology) Larey Dresser, MD as PCP - Advanced Heart Failure (Cardiology) Elmarie Shiley, MD (Nephrology) Milus Banister, MD (Gastroenterology) Jorge Ny, LCSW as Social Worker (Licensed Clinical Social Worker)  Patient Active Problem List   Diagnosis Date Noted  . Electrolyte abnormality 05/17/2020  . ICD (implantable cardioverter-defibrillator) in place 04/12/2020  . Chronic systolic heart failure ([place]) 01/02/2020  . Decreased appetite 10/08/2019  . Weakness 10/08/2019  . Hyperkalemia 08/26/2019  . Chronic systolic CHF (congestive heart failure) ([place]) 08/26/2019  . Type 2 diabetes mellitus with stage 3 chronic kidney disease ([place]) 08/26/2019  . Chronic obstructive pulmonary disease ([place]) 06/20/2019  . Ventricular tachycardia ([place]) 06/20/2019  . Acute on chronic systolic (congestive) heart failure ([place]) 04/25/2019  . Metabolic acidosis 40/98/1191  . Orthostasis 09/07/2018  . AKI (acute kidney injury) ([place])   . Immunosuppressed status ([place])   . Macrocytic anemia   . Chronic combined systolic and diastolic congestive heart failure ([place]) 06/03/2018  . Candida esophagitis ([place]) 09/22/2017  . History of smoking 30 or more pack years 01/05/2017  . Bipolar 1 disorder ([place])   . BPH (benign prostatic hyperplasia) 03/31/2013  . 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 Essential hypertension, benign 03/31/2013  . Acute renal failure superimposed on stage 3 chronic kidney disease ([place]) 06/04/2012  . 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[place]) 01/23/2010    Current Outpatient Medications:  .  acetaminophen (TYLENOL) 160 MG/5ML liquid, Take 325 mg by mouth every 4 (four) hours as needed for fever., Disp: , Rfl:  .  allopurinol (ZYLOPRIM) 100 MG tablet, Take 2 tablets  (200 mg total) by mouth daily., Disp: 60 tablet, Rfl: 5 .  apixaban (ELIQUIS) 5 MG TABS tablet, Take 1 tablet (5 mg total) by mouth 2 (two) times daily., Disp: 180 tablet, Rfl: 3 .  BUDESONIDE PO, Take 9 mg by mouth daily., Disp: , Rfl:  .  Calcium 500 MG tablet, Take 1 tablet (500 mg total) by mouth 1 day or 1 dose for 1 dose., Disp: 30 tablet, Rfl:  .  Cholecalciferol (VITAMIN D) 125 MCG (5000 UT) CAPS, Take 5,000 Units by mouth daily. , Disp: , Rfl:  .  dapagliflozin propanediol (FARXIGA) 10 MG TABS tablet, Take 1 tablet (10 mg total) by mouth daily before breakfast., Disp: 90 tablet, Rfl: 3 .  divalproex (DEPAKOTE ER) 500 MG 24 hr tablet, Take 1,500 mg by mouth at bedtime. , Disp: , Rfl:  .  empagliflozin (JARDIANCE) 25 MG TABS tablet, Take 25 mg by mouth daily. Take 1/2 tablet once daily., Disp: , Rfl:  .  Ensure (ENSURE), Take 1 Can by mouth 3 (three) times daily., Disp: , Rfl:  .  epoetin alfa (EPOGEN) 10000 UNIT/ML injection, Inject 10,000 Units into the skin every 30 (thirty) days., Disp: , Rfl:  .  ferrous sulfate 325 (65 FE) MG tablet, Take 325 mg by mouth in the morning and at bedtime., Disp: , Rfl:  .  furosemide (LASIX) 20 MG tablet, Take 1 tablet (20 mg total) by mouth every Monday, Wednesday, and Friday., Disp: 30 tablet, Rfl: 0 .  hydrALAZINE (APRESOLINE) 25 MG tablet, Take 25 mg by mouth 3 (three) times daily., Disp: , Rfl:  .  inFLIXimab (REMICADE) 100 MG  injection, Infuse Remicade IV schedule 1 52m/kg every 8 weeks Premedicate with Tylenol 500-6593mby mouth and Benadryl 25-5058my mouth prior to infusion. Last PPD was on 12/2009. , Disp: 1 each, Rfl: 6 .  isosorbide mononitrate (IMDUR) 60 MG 24 hr tablet, Take 1 tablet (60 mg total) by mouth daily., Disp: 90 tablet, Rfl: 3 .  ivabradine (CORLANOR) 5 MG TABS tablet, TAKE 1 TABLET(5 MG) BY MOUTH TWICE DAILY WITH A MEAL, Disp: 180 tablet, Rfl: 3 .  loperamide (IMODIUM) 2 MG capsule, Take 2 mg by mouth 3 (three) times daily as needed  for diarrhea or loose stools., Disp: , Rfl:  .  Magnesium 400 MG TABS, Take 400 mg by mouth daily., Disp: 30 tablet, Rfl: 1 .  metoprolol succinate (TOPROL-XL) 25 MG 24 hr tablet, Take 1 tablet (25 mg total) by mouth in the morning and at bedtime., Disp: 180 tablet, Rfl: 6 .  mirtazapine (REMERON) 7.5 MG tablet, TAKE 1 TABLET(7.5 MG) BY MOUTH AT BEDTIME, Disp: 30 tablet, Rfl: 3 .  omeprazole (PRILOSEC) 20 MG capsule, Take 20 mg by mouth daily., Disp: , Rfl:  .  prazosin (MINIPRESS) 1 MG capsule, Take 1 mg by mouth at bedtime., Disp: , Rfl:  .  QUEtiapine (SEROQUEL) 100 MG tablet, Take 150 mg by mouth at bedtime., Disp: , Rfl:  .  solifenacin (VESICARE) 5 MG tablet, Take 5 mg by mouth daily., Disp: , Rfl:  .  tamsulosin (FLOMAX) 0.4 MG CAPS capsule, Take 1 capsule (0.4 mg total) by mouth daily., Disp: 30 capsule, Rfl: 2 .  vitamin B-12 (CYANOCOBALAMIN) 500 MCG tablet, Take 1,000 mcg by mouth daily. , Disp: , Rfl:  Allergies  Allergen Reactions  . Azathioprine Other (See Comments)    REACTION: affected WBC "Almost died"  . Ciprofloxacin Other (See Comments)    Reaction not recalled  . Levaquin [Levofloxacin In D5w] Other (See Comments)    Reaction not rec  . Plendil [Felodipine] Other (See Comments)    Reaction not not recalled     Social History   Socioeconomic History  . Marital status: Divorced    Spouse name: Not on file  . Number of children: 1  . Years of education: 12 68 Highest education level: Not on file  Occupational History  . Occupation: retired  . Occupation: Veteran  Tobacco Use  . Smoking status: Former Smoker    Packs/day: 1.00    Years: 49.00    Pack years: 49.00    Types: Cigarettes    Start date: 04/11/1956    Quit date: 04/17/2014    Years since quitting: 6.1  . Smokeless tobacco: Never Used  Vaping Use  . Vaping Use: Never used  Substance and Sexual Activity  . Alcohol use: No    Alcohol/week: 0.0 standard drinks  . Drug use: No  . Sexual activity:  Never  Other Topics Concern  . Not on file  Social History Narrative  . Not on file   Social Determinants of Health   Financial Resource Strain: Not on file  Food Insecurity: Not on file  Transportation Needs: Not on file  Physical Activity: Not on file  Stress: Not on file  Social Connections: Not on file  Intimate Partner Violence: Not on file    Physical Exam Vitals reviewed.  Constitutional:      Appearance: Normal appearance. He is normal weight.  HENT:     Head: Normocephalic.     Nose: Nose normal.  Mouth/Throat:     Mouth: Mucous membranes are moist.     Pharynx: Oropharynx is clear.  Eyes:     Conjunctiva/sclera: Conjunctivae normal.     Pupils: Pupils are equal, round, and reactive to light.  Cardiovascular:     Rate and Rhythm: Normal rate and regular rhythm.     Pulses: Normal pulses.     Heart sounds: Normal heart sounds.  Pulmonary:     Effort: Pulmonary effort is normal.     Breath sounds: Normal breath sounds.  Abdominal:     General: Abdomen is flat.     Palpations: Abdomen is soft.  Musculoskeletal:        General: Normal range of motion.     Cervical back: Normal range of motion.     Right lower leg: No edema.     Left lower leg: No edema.  Skin:    General: Skin is warm and dry.     Capillary Refill: Capillary refill takes less than 2 seconds.  Neurological:     General: No focal deficit present.     Mental Status: He is alert. Mental status is at baseline.  Psychiatric:        Mood and Affect: Mood normal.     Arrived for home visit for [name] who was alert and oriented reporting to be feeling well today. [name] has been compliant with his medications over the last two weeks and reports no complaints. I obtained vitals as noted in report. [name] had not yet taken his medicines at 0900. I reviewed medications and filled one pill box for [name] also giving him his morning medications. [name] and I reviewed upcoming appointments  and verified same. I also assisted him in setting up his [place]. Home visit complete. I will see [name] in one week.   Refills:  at [place], [place]     Future Appointments  Date Time Provider [place]  06/22/2020 10:00 AM [facility] RM 1 [facility] None  06/26/2020  2:00 PM Wardell Honour, MD [facility] None  07/03/2020  7:00 AM [facility] DEVICE REMOTES [facility]  10/02/2020  7:00 AM [facility] DEVICE REMOTES [facility]  01/01/2021  7:00 AM [facility] DEVICE REMOTES [facility]  01/29/2021  1:00 PM [name], Dinah C, NP [facility] None  04/02/2021  7:00 AM [facility] DEVICE REMOTES [facility]  07/02/2021  7:00 AM [facility] DEVICE REMOTES [facility]     ACTION: Home visit completed Next visit planned for one week

## 2020-06-22 ENCOUNTER — Other Ambulatory Visit: Payer: Self-pay

## 2020-06-22 ENCOUNTER — Non-Acute Institutional Stay (HOSPITAL_COMMUNITY)
Admission: RE | Admit: 2020-06-22 | Discharge: 2020-06-22 | Disposition: A | Discharge status: Home / Self Care | Payer: Medicare Other | Source: Ambulatory Visit | Attending: Internal Medicine | Admitting: Internal Medicine

## 2020-06-22 DIAGNOSIS — N184 Chronic kidney disease, stage 4 (severe): Secondary | ICD-10-CM | POA: Insufficient documentation

## 2020-06-22 DIAGNOSIS — D631 Anemia in chronic kidney disease: Secondary | ICD-10-CM | POA: Insufficient documentation

## 2020-06-22 LAB — HEMOGLOBIN AND HEMATOCRIT, BLOOD
HCT: 34.1 % — ABNORMAL LOW (ref 39.0–52.0)
Hemoglobin: 10.4 g/dL — ABNORMAL LOW (ref 13.0–17.0)

## 2020-06-22 LAB — FERRITIN: Ferritin: 631 ng/mL — ABNORMAL HIGH (ref 24–336)

## 2020-06-22 LAB — IRON AND TIBC
Iron: 46 ug/dL (ref 45–182)
Saturation Ratios: 23 % (ref 17.9–39.5)
TIBC: 203 ug/dL — ABNORMAL LOW (ref 250–450)
UIBC: 157 ug/dL

## 2020-06-22 MED ORDER — SODIUM CHLORIDE 0.9 % IV SOLN
510.0000 mg | Freq: Once | INTRAVENOUS | Status: AC
  Administered 2020-06-22: 510 mg via INTRAVENOUS
  Filled 2020-06-22: qty 510

## 2020-06-22 MED ORDER — SODIUM CHLORIDE 0.9 % IV SOLN
INTRAVENOUS | Status: DC | PRN
  Administered 2020-06-22: 250 mL via INTRAVENOUS

## 2020-06-22 MED ORDER — EPOETIN ALFA 10000 UNIT/ML IJ SOLN
10000.0000 [IU] | INTRAMUSCULAR | Status: DC
  Administered 2020-06-22: 10000 [IU] via SUBCUTANEOUS
  Filled 2020-06-22: qty 1

## 2020-06-22 NOTE — Discharge Instructions (Signed)
Epoetin Alfa injection What is this medicine? EPOETIN ALFA (e POE e tin AL fa) helps your body make more red blood cells. This medicine is used to treat anemia caused by chronic kidney disease, cancer chemotherapy, or HIV-therapy. It may also be used before surgery if you have anemia. This medicine may be used for other purposes; ask your health care provider or pharmacist if you have questions. COMMON BRAND NAME(S): Epogen, Procrit, Retacrit What should I tell my health care provider before I take this medicine? They need to know if you have any of these conditions:  cancer  heart disease  high blood pressure  history of blood clots  history of stroke  low levels of folate, iron, or vitamin B12 in the blood  seizures  an unusual or allergic reaction to erythropoietin, albumin, benzyl alcohol, hamster proteins, other medicines, foods, dyes, or preservatives  pregnant or trying to get pregnant  breast-feeding How should I use this medicine? This medicine is for injection into a vein or under the skin. It is usually given by a health care professional in a hospital or clinic setting. If you get this medicine at home, you will be taught how to prepare and give this medicine. Use exactly as directed. Take your medicine at regular intervals. Do not take your medicine more often than directed. It is important that you put your used needles and syringes in a special sharps container. Do not put them in a trash can. If you do not have a sharps container, call your pharmacist or healthcare provider to get one. A special MedGuide will be given to you by the pharmacist with each prescription and refill. Be sure to read this information carefully each time. Talk to your pediatrician regarding the use of this medicine in children. While this drug may be prescribed for selected conditions, precautions do apply. Overdosage: If you think you have taken too much of this medicine contact a poison  control center or emergency room at once. NOTE: This medicine is only for you. Do not share this medicine with others. What if I miss a dose? If you miss a dose, take it as soon as you can. If it is almost time for your next dose, take only that dose. Do not take double or extra doses. What may interact with this medicine? Interactions have not been studied. This list may not describe all possible interactions. Give your health care provider a list of all the medicines, herbs, non-prescription drugs, or dietary supplements you use. Also tell them if you smoke, drink alcohol, or use illegal drugs. Some items may interact with your medicine. What should I watch for while using this medicine? Your condition will be monitored carefully while you are receiving this medicine. You may need blood work done while you are taking this medicine. This medicine may cause a decrease in vitamin B6. You should make sure that you get enough vitamin B6 while you are taking this medicine. Discuss the foods you eat and the vitamins you take with your health care professional. What side effects may I notice from receiving this medicine? Side effects that you should report to your doctor or health care professional as soon as possible:  allergic reactions like skin rash, itching or hives, swelling of the face, lips, or tongue  seizures  signs and symptoms of a blood clot such as breathing problems; changes in vision; chest pain; severe, sudden headache; pain, swelling, warmth in the leg; trouble speaking; sudden numbness or  weakness of the face, arm or leg  signs and symptoms of a stroke like changes in vision; confusion; trouble speaking or understanding; severe headaches; sudden numbness or weakness of the face, arm or leg; trouble walking; dizziness; loss of balance or coordination Side effects that usually do not require medical attention (report to your doctor or health care professional if they continue or are  bothersome):  chills  cough  dizziness  fever  headaches  joint pain  muscle cramps  muscle pain  nausea, vomiting  pain, redness, or irritation at site where injected This list may not describe all possible side effects. Call your doctor for medical advice about side effects. You may report side effects to FDA at 1-800-FDA-1088. Where should I keep my medicine? Keep out of the reach of children. Store in a refrigerator between 2 and 8 degrees C (36 and 46 degrees F). Do not freeze or shake. Throw away any unused portion if using a single-dose vial. Multi-dose vials can be kept in the refrigerator for up to 21 days after the initial dose. Throw away unused medicine. NOTE: This sheet is a summary. It may not cover all possible information. If you have questions about this medicine, talk to your doctor, pharmacist, or health care provider.  2021 Elsevier/Gold Standard (2016-09-19 08:35:19) Ferumoxytol injection What is this medicine? FERUMOXYTOL is an iron complex. Iron is used to make healthy red blood cells, which carry oxygen and nutrients throughout the body. This medicine is used to treat iron deficiency anemia. This medicine may be used for other purposes; ask your health care provider or pharmacist if you have questions. COMMON BRAND NAME(S): Feraheme What should I tell my health care provider before I take this medicine? They need to know if you have any of these conditions:  anemia not caused by low iron levels  high levels of iron in the blood  magnetic resonance imaging (MRI) test scheduled  an unusual or allergic reaction to iron, other medicines, foods, dyes, or preservatives  pregnant or trying to get pregnant  breast-feeding How should I use this medicine? This medicine is for injection into a vein. It is given by a health care professional in a hospital or clinic setting. Talk to your pediatrician regarding the use of this medicine in children. Special  care may be needed. Overdosage: If you think you have taken too much of this medicine contact a poison control center or emergency room at once. NOTE: This medicine is only for you. Do not share this medicine with others. What if I miss a dose? It is important not to miss your dose. Call your doctor or health care professional if you are unable to keep an appointment. What may interact with this medicine? This medicine may interact with the following medications:  other iron products This list may not describe all possible interactions. Give your health care provider a list of all the medicines, herbs, non-prescription drugs, or dietary supplements you use. Also tell them if you smoke, drink alcohol, or use illegal drugs. Some items may interact with your medicine. What should I watch for while using this medicine? Visit your doctor or healthcare professional regularly. Tell your doctor or healthcare professional if your symptoms do not start to get better or if they get worse. You may need blood work done while you are taking this medicine. You may need to follow a special diet. Talk to your doctor. Foods that contain iron include: whole grains/cereals, dried fruits, beans, or  peas, leafy green vegetables, and organ meats (liver, kidney). What side effects may I notice from receiving this medicine? Side effects that you should report to your doctor or health care professional as soon as possible:  allergic reactions like skin rash, itching or hives, swelling of the face, lips, or tongue  breathing problems  changes in blood pressure  feeling faint or lightheaded, falls  fever or chills  flushing, sweating, or hot feelings  swelling of the ankles or feet Side effects that usually do not require medical attention (report to your doctor or health care professional if they continue or are bothersome):  diarrhea  headache  nausea, vomiting  stomach pain This list may not describe  all possible side effects. Call your doctor for medical advice about side effects. You may report side effects to FDA at 1-800-FDA-1088. Where should I keep my medicine? This drug is given in a hospital or clinic and will not be stored at home. NOTE: This sheet is a summary. It may not cover all possible information. If you have questions about this medicine, talk to your doctor, pharmacist, or health care provider.  2021 Elsevier/Gold Standard (2016-03-31 20:21:10)

## 2020-06-22 NOTE — Progress Notes (Signed)
PATIENT CARE CENTER NOTE  Diagnosis:Anemia associated with Chronic Renal Failure, anemia associated with renal disease   Provider:Patel, Ulice Dash MD   Procedure:Feraheme infusion and Epoetin Alfa (Procrit) injection   Note:Patient received Feraheme infusion via PIV. Tolerated well with no adverse reaction. Observed patient for 30 minutes post-infusion.  Sub-qProcritinjection given inleftarm. Labs drawn pre-injection and Hemoglobin was10.4. Patient'sBPelevated but within parameters at146/102.Discharge instructions given. Patient alert, oriented and ambulatoryat discharge.

## 2020-06-26 ENCOUNTER — Encounter: Payer: Self-pay | Admitting: Family Medicine

## 2020-06-26 ENCOUNTER — Ambulatory Visit (INDEPENDENT_AMBULATORY_CARE_PROVIDER_SITE_OTHER): Payer: Medicare Other | Admitting: Family Medicine

## 2020-06-26 ENCOUNTER — Other Ambulatory Visit: Payer: Self-pay

## 2020-06-26 DIAGNOSIS — I5023 Acute on chronic systolic (congestive) heart failure: Secondary | ICD-10-CM

## 2020-06-26 DIAGNOSIS — K5000 Crohn's disease of small intestine without complications: Secondary | ICD-10-CM

## 2020-06-26 DIAGNOSIS — I10 Essential (primary) hypertension: Secondary | ICD-10-CM | POA: Diagnosis not present

## 2020-06-26 DIAGNOSIS — F319 Bipolar disorder, unspecified: Secondary | ICD-10-CM

## 2020-06-26 NOTE — Patient Instructions (Signed)
How to Perform the Epley Maneuver The Epley maneuver is an exercise that relieves symptoms of vertigo. Vertigo is the feeling that you or your surroundings are moving when they are not. When you feel vertigo, you may feel like the room is spinning and may have trouble walking. The Epley maneuver is used for a type of vertigo caused by a calcium deposit in a part of the inner ear. The maneuver involves changing head positions to help the deposit move out of the area. You can do this maneuver at home whenever you have symptoms of vertigo. You can repeat it in 24 hours if your vertigo has not gone away. Even though the Epley maneuver may relieve your vertigo for a few weeks, it is possible that your symptoms will return. This maneuver relieves vertigo, but it does not relieve dizziness. What are the risks? If it is done correctly, the Epley maneuver is considered safe. Sometimes it can lead to dizziness or nausea that goes away after a short time. If you develop other symptoms--such as changes in vision, weakness, or numbness--stop doing the maneuver and call your health care provider. Supplies needed:  A bed or table.  A pillow. How to do the Epley maneuver 1. Sit on the edge of a bed or table with your back straight and your legs extended or hanging over the edge of the bed or table. 2. Turn your head halfway toward the affected ear or side as told by your health care provider. 3. Lie backward quickly with your head turned until you are lying flat on your back. You may want to position a pillow under your shoulders. 4. Hold this position for at least 30 seconds. If you feel dizzy or have symptoms of vertigo, continue to hold the position until the symptoms stop. 5. Turn your head to the opposite direction until your unaffected ear is facing the floor. 6. Hold this position for at least 30 seconds. If you feel dizzy or have symptoms of vertigo, continue to hold the position until the symptoms  stop. 7. Turn your whole body to the same side as your head so that you are positioned on your side. Your head will now be nearly facedown. Hold for at least 30 seconds. If you feel dizzy or have symptoms of vertigo, continue to hold the position until the symptoms stop. 8. Sit back up. You can repeat the maneuver in 24 hours if your vertigo does not go away.      Follow these instructions at home: For 24 hours after doing the Epley maneuver:  Keep your head in an upright position.  When lying down to sleep or rest, keep your head raised (elevated) with two or more pillows.  Avoid excessive neck movements. Activity  Do not drive or use machinery if you feel dizzy.  After doing the Epley maneuver, return to your normal activities as told by your health care provider. Ask your health care provider what activities are safe for you. General instructions  Drink enough fluid to keep your urine pale yellow.  Do not drink alcohol.  Take over-the-counter and prescription medicines only as told by your health care provider.  Keep all follow-up visits as told by your health care provider. This is important. Preventing vertigo symptoms Ask your health care provider if there is anything you should do at home to prevent vertigo. He or she may recommend that you:  Keep your head elevated with two or more pillows while you sleep.  Do not sleep on the side of your affected ear.  Get up slowly from bed.  Avoid sudden movements during the day.  Avoid extreme head positions or movement, such as looking up or bending over. Contact a health care provider if:  Your vertigo gets worse.  You have other symptoms, including: ? Nausea. ? Vomiting. ? Headache. Get help right away if you:  Have vision changes.  Have a headache or neck pain that is severe or getting worse.  Cannot stop vomiting.  Have new numbness or weakness in any part of your body. Summary  Vertigo is the feeling that  you or your surroundings are moving when they are not.  The Epley maneuver is an exercise that relieves symptoms of vertigo.  If the Epley maneuver is done correctly, it is considered safe and relieves vertigo quickly. This information is not intended to replace advice given to you by your health care provider. Make sure you discuss any questions you have with your health care provider. Document Revised: 12/08/2018 Document Reviewed: 12/08/2018 Elsevier Patient Education  2021 [employer].

## 2020-06-26 NOTE — Progress Notes (Signed)
Provider:  Alain Honey, MD  Careteam: Patient Care Team: Wardell Honour, MD as PCP - General (Family Medicine) Martinique, Peter M, MD as PCP - Cardiology (Cardiology) Larey Dresser, MD as PCP - Advanced Heart Failure (Cardiology) Elmarie Shiley, MD (Nephrology) Milus Banister, MD (Gastroenterology) Jorge Ny, LCSW as Education officer, museum (Licensed Clinical Social Worker)  PLACE OF SERVICE:  [place] Directive information Does Patient Have a Medical Advance Directive?: Yes, Type of Advance Directive: Out of facility DNR (pink MOST or yellow form), Pre-existing out of facility DNR order (yellow form or pink MOST form): Pink MOST form placed in chart (order not valid for inpatient use), Does patient want to make changes to medical advance directive?: No - Patient declined  Allergies  Allergen Reactions  . Azathioprine Other (See Comments)    REACTION: affected WBC "Almost died"  . Ciprofloxacin Other (See Comments)    Reaction not recalled  . Levaquin [Levofloxacin In D5w] Other (See Comments)    Reaction not rec  . Plendil [Felodipine] Other (See Comments)    Reaction not not recalled    Chief Complaint  Patient presents with  . Follow-up    1 month follow-up on magnesium and calcium. Patient also c/o ongoing dizziness.      HPI: Patient is a 75 y.o. male he presents today to follow-up on hypomagnesia and hypocalcemia.  He has been on supplements since his discharge from the hospital 1 month ago.  He was seen in a transition of care visit then. He generally feels well.  He has had some dizziness that seems to have a strong positional component.  He denies palpitations and he reminds me that he has an implanted defibrillator.  Review of Systems:  Review of Systems  Respiratory: Negative.   Cardiovascular: Negative.   Gastrointestinal: Negative.   Musculoskeletal: Negative.   Neurological: Positive for dizziness.  Psychiatric/Behavioral: Negative.   All  other systems reviewed and are negative.   Past Medical History:  Diagnosis Date  . Anemia   . Anxiety   . Benign paroxysmal positional vertigo   . Bipolar I disorder, most recent episode (or current) unspecified   . Celiac disease   . CHF (congestive heart failure) ([place])   . Chronic kidney disease, stage III (moderate) (HCC)   . Crohn's    Remicade q8 weeks  . Depression   . Essential and other specified forms of tremor    medication-induced Parkinson's, now resolved  . Essential hypertension, benign   . Gout 2018  . Heart failure ([place])   . Hypertrophy of prostate without urinary obstruction and other lower urinary tract symptoms (LUTS)   . Impotence of organic origin   . Insomnia with sleep apnea, unspecified   . Iron deficiency anemia, unspecified   . 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 Neuralgia, neuritis, and radiculitis, unspecified   . Other B-complex deficiencies   . Other extrapyramidal disease and abnormal movement disorder   . 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 Tobacco use disorder   . Vertigo 2018   Past Surgical History:  Procedure Laterality Date  . CATARACT EXTRACTION, BILATERAL Bilateral 09/2018  . CHOLECYSTECTOMY  07-12-2010  . ICD IMPLANT N/A 01/02/2020   Procedure: ICD IMPLANT;  Surgeon: Evans Lance, MD;  Location: [facility];  Service: Cardiovascular;  Laterality: N/A;  . RIGHT HEART CATH N/A 10/12/2018   Procedure: RIGHT HEART CATH;  Surgeon: Jolaine Artist, MD;  Location: [facility];  Service: Cardiovascular;  Laterality: N/A;  . RIGHT/LEFT HEART CATH AND CORONARY ANGIOGRAPHY N/A 04/26/2019   Procedure: RIGHT/LEFT HEART CATH AND CORONARY ANGIOGRAPHY;  Surgeon: Larey Dresser, MD;  Location: [facility];  Service: Cardiovascular;  Laterality: N/A;  . SMALL INTESTINE SURGERY     x 2   Social History:   reports that he quit smoking about 6 years ago. His smoking use included cigarettes. He started smoking about 64 years ago.  He has a 49.00 pack-year smoking history. He has never used smokeless tobacco. He reports that he does not drink alcohol and does not use drugs.  Family History  Problem Relation Age of Onset  . Diabetes Mother        maternal grandmother  . Uterine cancer Mother   . Emphysema Father   . Pneumonia Maternal Grandmother   . Colon cancer Neg Hx     Medications: Patient's Medications  New Prescriptions   No medications on file  Previous Medications   ACETAMINOPHEN (TYLENOL) 160 MG/5ML LIQUID    Take 325 mg by mouth every 4 (four) hours as needed for fever.   ALLOPURINOL (ZYLOPRIM) 100 MG TABLET    Take 2 tablets (200 mg total) by mouth daily.   APIXABAN (ELIQUIS) 5 MG TABS TABLET    Take 1 tablet (5 mg total) by mouth 2 (two) times daily.   BUDESONIDE PO    Take 9 mg by mouth daily.   CALCIUM 500 MG TABLET    Take 1 tablet (500 mg total) by mouth 1 day or 1 dose for 1 dose.   CHOLECALCIFEROL (VITAMIN D) 125 MCG (5000 UT) CAPS    Take 5,000 Units by mouth daily.    DAPAGLIFLOZIN PROPANEDIOL (FARXIGA) 10 MG TABS TABLET    Take 1 tablet (10 mg total) by mouth daily before breakfast.   DIVALPROEX (DEPAKOTE ER) 500 MG 24 HR TABLET    Take 1,500 mg by mouth at bedtime.    EMPAGLIFLOZIN (JARDIANCE) 25 MG TABS TABLET    Take 25 mg by mouth daily. Take 1/2 tablet once daily.   ENSURE (ENSURE)    Take 1 Can by mouth 3 (three) times daily.   EPOETIN ALFA (EPOGEN) 10000 UNIT/ML INJECTION    Inject 10,000 Units into the skin every 30 (thirty) days.   FERROUS SULFATE 325 (65 FE) MG TABLET    Take 325 mg by mouth in the morning and at bedtime.   FUROSEMIDE (LASIX) 20 MG TABLET    Take 1 tablet (20 mg total) by mouth every Monday, Wednesday, and Friday.   HYDRALAZINE (APRESOLINE) 25 MG TABLET    Take 25 mg by mouth 3 (three) times daily.   INFLIXIMAB (REMICADE) 100 MG INJECTION    Infuse Remicade IV schedule 1 46m/kg every 8 weeks Premedicate with Tylenol 500-6533mby mouth and Benadryl 25-5023my mouth  prior to infusion. Last PPD was on 12/2009.    ISOSORBIDE MONONITRATE (IMDUR) 60 MG 24 HR TABLET    Take 1 tablet (60 mg total) by mouth daily.   IVABRADINE (CORLANOR) 5 MG TABS TABLET    TAKE 1 TABLET(5 MG) BY MOUTH TWICE DAILY WITH A MEAL   LOPERAMIDE (IMODIUM) 2 MG CAPSULE    Take 2 mg by mouth 3 (three) times daily as needed for diarrhea or loose stools.   MAGNESIUM 400 MG TABS    Take 400 mg by mouth daily.   METOPROLOL SUCCINATE (TOPROL-XL) 25 MG 24 HR TABLET  Take 1 tablet (25 mg total) by mouth in the morning and at bedtime.   MIRTAZAPINE (REMERON) 7.5 MG TABLET    TAKE 1 TABLET(7.5 MG) BY MOUTH AT BEDTIME   OMEPRAZOLE (PRILOSEC) 20 MG CAPSULE    Take 20 mg by mouth daily.   PRAZOSIN (MINIPRESS) 1 MG CAPSULE    Take 1 mg by mouth at bedtime.   QUETIAPINE (SEROQUEL) 100 MG TABLET    Take 150 mg by mouth at bedtime.   SOLIFENACIN (VESICARE) 5 MG TABLET    Take 5 mg by mouth daily.   TAMSULOSIN (FLOMAX) 0.4 MG CAPS CAPSULE    Take 1 capsule (0.4 mg total) by mouth daily.   VITAMIN B-12 (CYANOCOBALAMIN) 500 MCG TABLET    Take 1,000 mcg by mouth daily.   Modified Medications   No medications on file  Discontinued Medications   No medications on file    Physical Exam:  Vitals:   06/26/20 1407  BP: (!) 144/88  Pulse: 85  Temp: (!) 97.1 F (36.2 C)  TempSrc: Temporal  SpO2: 99%  Weight: 174 lb (78.9 kg)  Height: 6' 4"  (1.93 m)   Body mass index is 21.18 kg/m. Wt Readings from Last 3 Encounters:  06/26/20 174 lb (78.9 kg)  06/20/20 173 lb (78.5 kg)  06/06/20 175 lb (79.4 kg)    Physical Exam Vitals and nursing note reviewed.  Constitutional:      Appearance: Normal appearance.  HENT:     Head: Normocephalic.     Right Ear: Tympanic membrane normal.     Left Ear: Tympanic membrane normal.  Cardiovascular:     Rate and Rhythm: Normal rate and regular rhythm.  Pulmonary:     Effort: Pulmonary effort is normal.     Breath sounds: Normal breath sounds.   Musculoskeletal:     Cervical back: Normal range of motion.  Neurological:     General: No focal deficit present.     Mental Status: He is alert and oriented to person, place, and time.  Psychiatric:        Mood and Affect: Mood normal.        Behavior: Behavior normal.        Thought Content: Thought content normal.        Judgment: Judgment normal.     Labs reviewed: Basic Metabolic Panel: Recent Labs    08/27/19 0201 08/28/19 0323 09/06/19 1559 09/28/19 0946 10/20/19 1158 11/14/19 1413 05/17/20 1814 05/18/20 0524 05/18/20 1451 05/19/20 0234  NA 140 138   < > 141 142   < > 141 141 140 142  K 5.5* 5.3*   < > 5.2 5.7*   < > 2.7* 2.8* 3.0* 3.5  CL 107 103   < > 114* 112*   < > 108 112* 112* 114*  CO2 25 27   < > 20 27   < > 25 20* 22 20*  GLUCOSE 127* 97   < > 102* 78   < > 84 74 116* 87  BUN 36* 34*   < > 42* 27*   < > 27* 24* 22 21  CREATININE 2.52* 2.57*   < > 2.55* 2.08*   < > 2.21* 1.78* 1.99* 1.80*  CALCIUM 8.4* 8.6*   < > 8.9 9.2   < > 6.4* 6.2*  6.1 6.8* 7.2*  MG 1.8 1.6*   < >  --   --   --  0.6* 0.9* 1.3*  --   PHOS  4.4 3.9  --   --   --   --   --   --   --   --   TSH  --   --   --  1.22 2.46  --   --   --   --   --    < > = values in this interval not displayed.   Liver Function Tests: Recent Labs    08/27/19 0201 08/28/19 0323 09/06/19 1559 09/28/19 0946 10/20/19 1158 05/18/20 0524  AST 11* 11*   < > 8* 11 11*  11*  ALT 9 10   < > 5* 8* 10  9  ALKPHOS 56 57  --   --   --  43  44  BILITOT 0.4 0.4   < > 0.3 0.3 0.6  0.5  PROT 5.6* 6.2*   < > 6.7 6.7 5.9*  5.9*  ALBUMIN 2.8* 3.0*  --   --   --  2.3*  2.3*   < > = values in this interval not displayed.   No results for input(s): LIPASE, AMYLASE in the last 8760 hours. No results for input(s): AMMONIA in the last 8760 hours. CBC: Recent Labs    12/30/19 1025 01/16/20 1015 05/17/20 1814 05/18/20 0524 05/19/20 0234 05/25/20 1035 06/22/20 1048  WBC 10.7  --  11.1* 9.4 9.5  --   --    NEUTROABS 6.1  --  6.8 5.6  --   --   --   HGB 12.2*   < > 11.0* 9.8* 10.0* 10.6* 10.4*  HCT 37.1*   < > 35.5* 30.7* 31.8* 33.8* 34.1*  MCV 101*  --  107.6* 104.4* 103.9*  --   --   PLT 177  --  133* 123* 130*  --   --    < > = values in this interval not displayed.   Lipid Panel: Recent Labs    10/20/19 1158  CHOL 161  HDL 54  LDLCALC 86  TRIG 112  CHOLHDL 3.0   TSH: Recent Labs    09/28/19 0946 10/20/19 1158  TSH 1.22 2.46   A1C: Lab Results  Component Value Date   HGBA1C 5.1 08/27/2019     Assessment/Plan  1. Hypomagnesemia Patient has been taking magnesium supplements 1 tablet daily.  Repeat magnesium level today - Basic Metabolic Panel - Magnesium  2. Essential hypertension, benign Blood pressure good at 144/88  3. Acute on chronic systolic (congestive) heart failure (HCC) Asymptomatic at this time continue with current regimen  4. Crohn's disease of small intestine without complication (HCC) Has chronic diarrhea and is probably a contributing factor to his electrolyte abnormalities  5. Bipolar 1 disorder (HCC) Stable.  Continue same medicines   Alain Honey, MD [place] [phone]

## 2020-06-27 ENCOUNTER — Other Ambulatory Visit (HOSPITAL_COMMUNITY): Payer: Self-pay

## 2020-06-27 LAB — BASIC METABOLIC PANEL
BUN/Creatinine Ratio: 13 (calc) (ref 6–22)
BUN: 38 mg/dL — ABNORMAL HIGH (ref 7–25)
CO2: 22 mmol/L (ref 20–32)
Calcium: 8.2 mg/dL — ABNORMAL LOW (ref 8.6–10.3)
Chloride: 107 mmol/L (ref 98–110)
Creat: 2.9 mg/dL — ABNORMAL HIGH (ref 0.70–1.18)
Glucose, Bld: 96 mg/dL (ref 65–99)
Potassium: 2.9 mmol/L — ABNORMAL LOW (ref 3.5–5.3)
Sodium: 142 mmol/L (ref 135–146)

## 2020-06-27 LAB — MAGNESIUM: Magnesium: 1.2 mg/dL — ABNORMAL LOW (ref 1.5–2.5)

## 2020-06-27 NOTE — Progress Notes (Signed)
Paramedicine Encounter    Patient ID: Brent [name], male    DOB: 07/15/1945, 75 y.o.   MRN: [id number]  Arrived for med rec visit for [name]. Mr. [name] medications were reviewed and verified. 2 pill boxes filled accordingly. Jeneen Rinks and I reviewed appointments. Dheeraj agreed upon home visit in two weeks. Visit complete.   Refills: Seroquel (#2 fri-tues bedtime) Budesonide      ACTION: Home visit completed Next visit planned for two weeks

## 2020-06-28 LAB — HM DIABETES EYE EXAM

## 2020-07-03 ENCOUNTER — Ambulatory Visit (INDEPENDENT_AMBULATORY_CARE_PROVIDER_SITE_OTHER): Payer: No Typology Code available for payment source

## 2020-07-03 DIAGNOSIS — I5022 Chronic systolic (congestive) heart failure: Secondary | ICD-10-CM

## 2020-07-03 LAB — CUP PACEART REMOTE DEVICE CHECK
Battery Remaining Longevity: 134 mo
Battery Voltage: 3.07 V
Brady Statistic RV Percent Paced: 0 %
Date Time Interrogation Session: 20220510033423
HighPow Impedance: 66 Ohm
Implantable Lead Implant Date: 20211108
Implantable Lead Location: 753860
Implantable Pulse Generator Implant Date: 20211108
Lead Channel Impedance Value: 285 Ohm
Lead Channel Impedance Value: 361 Ohm
Lead Channel Pacing Threshold Amplitude: 1.125 V
Lead Channel Pacing Threshold Pulse Width: 0.4 ms
Lead Channel Sensing Intrinsic Amplitude: 15 mV
Lead Channel Sensing Intrinsic Amplitude: 15 mV
Lead Channel Setting Pacing Amplitude: 2.25 V
Lead Channel Setting Pacing Pulse Width: 0.4 ms
Lead Channel Setting Sensing Sensitivity: 0.3 mV

## 2020-07-11 ENCOUNTER — Other Ambulatory Visit (HOSPITAL_COMMUNITY): Payer: Self-pay

## 2020-07-11 NOTE — Progress Notes (Signed)
Paramedicine Encounter    Patient ID: Brent [name], male    DOB: 05/14/1945, 75 y.o.   MRN: [id number]   Patient Care Team: Wardell Honour, MD as PCP - General (Family Medicine) Martinique, Peter M, MD as PCP - Cardiology (Cardiology) Larey Dresser, MD as PCP - Advanced Heart Failure (Cardiology) Elmarie Shiley, MD (Nephrology) Milus Banister, MD (Gastroenterology) Jorge Ny, LCSW as Social Worker (Licensed Clinical Social Worker)  Patient Active Problem List   Diagnosis Date Noted  . Electrolyte abnormality 05/17/2020  . ICD (implantable cardioverter-defibrillator) in place 04/12/2020  . Chronic systolic heart failure ([place]) 01/02/2020  . Decreased appetite 10/08/2019  . Weakness 10/08/2019  . Hyperkalemia 08/26/2019  . Chronic systolic CHF (congestive heart failure) ([place]) 08/26/2019  . Type 2 diabetes mellitus with stage 3 chronic kidney disease ([place]) 08/26/2019  . Chronic obstructive pulmonary disease ([place]) 06/20/2019  . Ventricular tachycardia ([place]) 06/20/2019  . Acute on chronic systolic (congestive) heart failure ([place]) 04/25/2019  . Metabolic acidosis 66/44/0347  . Orthostasis 09/07/2018  . AKI (acute kidney injury) ([place])   . Immunosuppressed status ([place])   . Macrocytic anemia   . Chronic combined systolic and diastolic congestive heart failure ([place]) 06/03/2018  . Candida esophagitis ([place]) 09/22/2017  . History of smoking 30 or more pack years 01/05/2017  . Bipolar 1 disorder ([place])   . BPH (benign prostatic hyperplasia) 03/31/2013  . 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 Essential hypertension, benign 03/31/2013  . Acute renal failure superimposed on stage 3 chronic kidney disease ([place]) 06/04/2012  . 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  acetaminophen (TYLENOL) 160 MG/5ML liquid, Take 325 mg by mouth every 4 (four) hours as needed for fever., Disp: , Rfl:  .  allopurinol (ZYLOPRIM) 100 MG tablet, Take 2 tablets  (200 mg total) by mouth daily., Disp: 60 tablet, Rfl: 5 .  apixaban (ELIQUIS) 5 MG TABS tablet, Take 1 tablet (5 mg total) by mouth 2 (two) times daily., Disp: 180 tablet, Rfl: 3 .  BUDESONIDE PO, Take 9 mg by mouth daily., Disp: , Rfl:  .  Calcium 500 MG tablet, Take 1 tablet (500 mg total) by mouth 1 day or 1 dose for 1 dose., Disp: 30 tablet, Rfl:  .  Cholecalciferol (VITAMIN D) 125 MCG (5000 UT) CAPS, Take 5,000 Units by mouth daily. , Disp: , Rfl:  .  dapagliflozin propanediol (FARXIGA) 10 MG TABS tablet, Take 1 tablet (10 mg total) by mouth daily before breakfast., Disp: 90 tablet, Rfl: 3 .  divalproex (DEPAKOTE ER) 500 MG 24 hr tablet, Take 1,500 mg by mouth at bedtime. , Disp: , Rfl:  .  empagliflozin (JARDIANCE) 25 MG TABS tablet, Take 25 mg by mouth daily. Take 1/2 tablet once daily., Disp: , Rfl:  .  Ensure (ENSURE), Take 1 Can by mouth 3 (three) times daily., Disp: , Rfl:  .  epoetin alfa (EPOGEN) 10000 UNIT/ML injection, Inject 10,000 Units into the skin every 30 (thirty) days., Disp: , Rfl:  .  ferrous sulfate 325 (65 FE) MG tablet, Take 325 mg by mouth in the morning and at bedtime., Disp: , Rfl:  .  furosemide (LASIX) 20 MG tablet, Take 1 tablet (20 mg total) by mouth every Monday, Wednesday, and Friday., Disp: 39 tablet, Rfl: 3 .  hydrALAZINE (APRESOLINE) 25 MG tablet, Take 25 mg by mouth 3 (three) times daily., Disp: , Rfl:  .  inFLIXimab (REMICADE) 100 MG  injection, Infuse Remicade IV schedule 1 57m/kg every 8 weeks Premedicate with Tylenol 500-6558mby mouth and Benadryl 25-5049my mouth prior to infusion. Last PPD was on 12/2009. , Disp: 1 each, Rfl: 6 .  isosorbide mononitrate (IMDUR) 60 MG 24 hr tablet, Take 1 tablet (60 mg total) by mouth daily., Disp: 90 tablet, Rfl: 3 .  ivabradine (CORLANOR) 5 MG TABS tablet, TAKE 1 TABLET(5 MG) BY MOUTH TWICE DAILY WITH A MEAL, Disp: 180 tablet, Rfl: 3 .  loperamide (IMODIUM) 2 MG capsule, Take 2 mg by mouth 3 (three) times daily as needed  for diarrhea or loose stools., Disp: , Rfl:  .  Magnesium 400 MG TABS, Take 400 mg by mouth daily., Disp: 30 tablet, Rfl: 1 .  metoprolol succinate (TOPROL-XL) 25 MG 24 hr tablet, Take 1 tablet (25 mg total) by mouth in the morning and at bedtime., Disp: 180 tablet, Rfl: 6 .  mirtazapine (REMERON) 7.5 MG tablet, TAKE 1 TABLET(7.5 MG) BY MOUTH AT BEDTIME, Disp: 30 tablet, Rfl: 3 .  omeprazole (PRILOSEC) 20 MG capsule, Take 20 mg by mouth daily., Disp: , Rfl:  .  prazosin (MINIPRESS) 1 MG capsule, Take 1 mg by mouth at bedtime., Disp: , Rfl:  .  QUEtiapine (SEROQUEL) 100 MG tablet, Take 150 mg by mouth at bedtime., Disp: , Rfl:  .  solifenacin (VESICARE) 5 MG tablet, Take 5 mg by mouth daily., Disp: , Rfl:  .  tamsulosin (FLOMAX) 0.4 MG CAPS capsule, Take 1 capsule (0.4 mg total) by mouth daily., Disp: 30 capsule, Rfl: 2 .  vitamin B-12 (CYANOCOBALAMIN) 500 MCG tablet, Take 1,000 mcg by mouth daily. , Disp: , Rfl:  Allergies  Allergen Reactions  . Azathioprine Other (See Comments)    REACTION: affected WBC "Almost died"  . Ciprofloxacin Other (See Comments)    Reaction not recalled  . Levaquin [Levofloxacin In D5w] Other (See Comments)    Reaction not rec  . Plendil [Felodipine] Other (See Comments)    Reaction not not recalled     Social History   Socioeconomic History  . Marital status: Divorced    Spouse name: Not on file  . Number of children: 1  . Years of education: 12 31 Highest education level: Not on file  Occupational History  . Occupation: retired  . Occupation: Veteran  Tobacco Use  . Smoking status: Former Smoker    Packs/day: 1.00    Years: 49.00    Pack years: 49.00    Types: Cigarettes    Start date: 04/11/1956    Quit date: 04/17/2014    Years since quitting: 6.2  . Smokeless tobacco: Never Used  Vaping Use  . Vaping Use: Never used  Substance and Sexual Activity  . Alcohol use: No    Alcohol/week: 0.0 standard drinks  . Drug use: No  . Sexual activity:  Never  Other Topics Concern  . Not on file  Social History Narrative  . Not on file   Social Determinants of Health   Financial Resource Strain: Not on file  Food Insecurity: Not on file  Transportation Needs: Not on file  Physical Activity: Not on file  Stress: Not on file  Social Connections: Not on file  Intimate Partner Violence: Not on file    Physical Exam Vitals reviewed.  Constitutional:      Appearance: Normal appearance. He is normal weight.  HENT:     Head: Normocephalic.     Nose: Nose normal.  Mouth/Throat:     Mouth: Mucous membranes are moist.     Pharynx: Oropharynx is clear.  Eyes:     Conjunctiva/sclera: Conjunctivae normal.     Pupils: Pupils are equal, round, and reactive to light.  Cardiovascular:     Rate and Rhythm: Normal rate and regular rhythm.     Pulses: Normal pulses.     Heart sounds: Normal heart sounds.  Pulmonary:     Effort: Pulmonary effort is normal.     Breath sounds: Normal breath sounds.  Abdominal:     General: Abdomen is flat.     Palpations: Abdomen is soft.  Musculoskeletal:        General: Normal range of motion.     Cervical back: Normal range of motion.     Right lower leg: No edema.     Left lower leg: No edema.  Skin:    General: Skin is warm and dry.     Capillary Refill: Capillary refill takes less than 2 seconds.  Neurological:     General: No focal deficit present.     Mental Status: He is alert. Mental status is at baseline.  Psychiatric:        Mood and Affect: Mood normal.      Arrived for home visit for Brent [name] was alert and oriented reporting to be feeling good with no reports of any pain, dizziness, shortness of breath. Brent [name] has been complaint with his medications over the last two weeks. I obtained vitals and reviewed same. Brent [name]' blood pressure was noted to be elevated. According to Dr. Vilinda Flake can take Hydralazine if SBP >130, one was given to Brent [name] today due to HTN. Brent [name] and I discussed  importance of taking his medications and assessing his blood pressure daily. Brent [name] agreed with plan. Medications were reviewed and confirmed. Pill box was filled accordingly. Appointments were confirmed and wrote into planner. No edema and swelling noted. Lung sounds clear. Home visit complete.    Refills: Allupurinol      Future Appointments  Date Time Provider [place]  07/20/2020  9:00 AM [facility] RM 2 [facility] None  10/02/2020  7:00 AM [facility] DEVICE REMOTES [facility]  10/30/2020  1:00 PM Wardell Honour, MD [facility] None  01/01/2021  7:00 AM [facility] DEVICE REMOTES [facility]  01/29/2021  1:00 PM [name], Dinah C, NP [facility] None  04/02/2021  7:00 AM [facility] DEVICE REMOTES [facility]  07/02/2021  7:00 AM [facility] DEVICE REMOTES [facility]     ACTION: Home visit completed Next visit planned for one week

## 2020-07-12 DIAGNOSIS — L03032 Cellulitis of left toe: Secondary | ICD-10-CM | POA: Diagnosis not present

## 2020-07-12 DIAGNOSIS — L02611 Cutaneous abscess of right foot: Secondary | ICD-10-CM | POA: Diagnosis not present

## 2020-07-12 DIAGNOSIS — I739 Peripheral vascular disease, unspecified: Secondary | ICD-10-CM | POA: Diagnosis not present

## 2020-07-12 DIAGNOSIS — L84 Corns and callosities: Secondary | ICD-10-CM | POA: Diagnosis not present

## 2020-07-12 DIAGNOSIS — B351 Tinea unguium: Secondary | ICD-10-CM | POA: Diagnosis not present

## 2020-07-18 ENCOUNTER — Other Ambulatory Visit (HOSPITAL_COMMUNITY): Payer: Self-pay

## 2020-07-18 NOTE — Progress Notes (Signed)
Paramedicine Encounter    Patient ID: Brent [name], male    DOB: 11-Dec-1945, 75 y.o.   MRN: [id number]   Patient Care Team: Wardell Honour, MD as PCP - General (Family Medicine) [place], Peter M, MD as PCP - Cardiology (Cardiology) Larey Dresser, MD as PCP - Advanced Heart Failure (Cardiology) Elmarie Shiley, MD (Nephrology) Milus Banister, MD (Gastroenterology) Jorge Ny, LCSW as Social Worker (Licensed Clinical Social Worker)  Patient Active Problem List   Diagnosis Date Noted  . Electrolyte abnormality 05/17/2020  . ICD (implantable cardioverter-defibrillator) in place 04/12/2020  . Chronic systolic heart failure ([place]) 01/02/2020  . Decreased appetite 10/08/2019  . Weakness 10/08/2019  . Hyperkalemia 08/26/2019  . Chronic systolic CHF (congestive heart failure) ([place]) 08/26/2019  . Type 2 diabetes mellitus with stage 3 chronic kidney disease ([place]) 08/26/2019  . Chronic obstructive pulmonary disease ([place]) 06/20/2019  . Ventricular tachycardia ([place]) 06/20/2019  . Acute on chronic systolic (congestive) heart failure ([place]) 04/25/2019  . Metabolic acidosis 11/57/2620  . Orthostasis 09/07/2018  . AKI (acute kidney injury) ([place])   . Immunosuppressed status ([place])   . Macrocytic anemia   . Chronic combined systolic and diastolic congestive heart failure ([place]) 06/03/2018  . Candida esophagitis ([place]) 09/22/2017  . History of smoking 30 or more pack years 01/05/2017  . Bipolar 1 disorder ([place])   . BPH (benign prostatic hyperplasia) 03/31/2013  . 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 Essential hypertension, benign 03/31/2013  . Acute renal failure superimposed on stage 3 chronic kidney disease ([place]) 06/04/2012  . 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[place]) 01/23/2010    Current Outpatient Medications:  .  acetaminophen (TYLENOL) 160 MG/5ML liquid, Take 325 mg by mouth every 4 (four) hours as needed for fever., Disp: , Rfl:  .  allopurinol (ZYLOPRIM) 100 MG tablet, Take 2 tablets  (200 mg total) by mouth daily., Disp: 60 tablet, Rfl: 5 .  apixaban (ELIQUIS) 5 MG TABS tablet, Take 1 tablet (5 mg total) by mouth 2 (two) times daily., Disp: 180 tablet, Rfl: 3 .  BUDESONIDE PO, Take 9 mg by mouth daily., Disp: , Rfl:  .  Calcium 500 MG tablet, Take 1 tablet (500 mg total) by mouth 1 day or 1 dose for 1 dose., Disp: 30 tablet, Rfl:  .  Cholecalciferol (VITAMIN D) 125 MCG (5000 UT) CAPS, Take 5,000 Units by mouth daily. , Disp: , Rfl:  .  dapagliflozin propanediol (FARXIGA) 10 MG TABS tablet, Take 1 tablet (10 mg total) by mouth daily before breakfast., Disp: 90 tablet, Rfl: 3 .  divalproex (DEPAKOTE ER) 500 MG 24 hr tablet, Take 1,500 mg by mouth at bedtime. , Disp: , Rfl:  .  empagliflozin (JARDIANCE) 25 MG TABS tablet, Take 25 mg by mouth daily. Take 1/2 tablet once daily., Disp: , Rfl:  .  Ensure (ENSURE), Take 1 Can by mouth 3 (three) times daily., Disp: , Rfl:  .  epoetin alfa (EPOGEN) 10000 UNIT/ML injection, Inject 10,000 Units into the skin every 30 (thirty) days., Disp: , Rfl:  .  ferrous sulfate 325 (65 FE) MG tablet, Take 325 mg by mouth in the morning and at bedtime., Disp: , Rfl:  .  furosemide (LASIX) 20 MG tablet, Take 1 tablet (20 mg total) by mouth every Monday, Wednesday, and Friday., Disp: 39 tablet, Rfl: 3 .  hydrALAZINE (APRESOLINE) 25 MG tablet, Take 25 mg by mouth 3 (three) times daily., Disp: , Rfl:  .  inFLIXimab (REMICADE) 100 MG  injection, Infuse Remicade IV schedule 1 64m/kg every 8 weeks Premedicate with Tylenol 500-6573mby mouth and Benadryl 25-504my mouth prior to infusion. Last PPD was on 12/2009. , Disp: 1 each, Rfl: 6 .  isosorbide mononitrate (IMDUR) 60 MG 24 hr tablet, Take 1 tablet (60 mg total) by mouth daily., Disp: 90 tablet, Rfl: 3 .  ivabradine (CORLANOR) 5 MG TABS tablet, TAKE 1 TABLET(5 MG) BY MOUTH TWICE DAILY WITH A MEAL, Disp: 180 tablet, Rfl: 3 .  loperamide (IMODIUM) 2 MG capsule, Take 2 mg by mouth 3 (three) times daily as needed  for diarrhea or loose stools., Disp: , Rfl:  .  Magnesium 400 MG TABS, Take 400 mg by mouth daily., Disp: 30 tablet, Rfl: 1 .  metoprolol succinate (TOPROL-XL) 25 MG 24 hr tablet, Take 1 tablet (25 mg total) by mouth in the morning and at bedtime., Disp: 180 tablet, Rfl: 6 .  mirtazapine (REMERON) 7.5 MG tablet, TAKE 1 TABLET(7.5 MG) BY MOUTH AT BEDTIME, Disp: 30 tablet, Rfl: 3 .  omeprazole (PRILOSEC) 20 MG capsule, Take 20 mg by mouth daily., Disp: , Rfl:  .  prazosin (MINIPRESS) 1 MG capsule, Take 1 mg by mouth at bedtime. (Patient not taking: Reported on 07/11/2020), Disp: , Rfl:  .  QUEtiapine (SEROQUEL) 100 MG tablet, Take 150 mg by mouth at bedtime., Disp: , Rfl:  .  solifenacin (VESICARE) 5 MG tablet, Take 5 mg by mouth daily., Disp: , Rfl:  .  tamsulosin (FLOMAX) 0.4 MG CAPS capsule, Take 1 capsule (0.4 mg total) by mouth daily., Disp: 30 capsule, Rfl: 2 .  vitamin B-12 (CYANOCOBALAMIN) 500 MCG tablet, Take 1,000 mcg by mouth daily. , Disp: , Rfl:  Allergies  Allergen Reactions  . Azathioprine Other (See Comments)    REACTION: affected WBC "Almost died"  . Ciprofloxacin Other (See Comments)    Reaction not recalled  . Levaquin [Levofloxacin In D5w] Other (See Comments)    Reaction not rec  . Plendil [Felodipine] Other (See Comments)    Reaction not not recalled     Social History   Socioeconomic History  . Marital status: Divorced    Spouse name: Not on file  . Number of children: 1  . Years of education: 12 63 Highest education level: Not on file  Occupational History  . Occupation: retired  . Occupation: Veteran  Tobacco Use  . Smoking status: Former Smoker    Packs/day: 1.00    Years: 49.00    Pack years: 49.00    Types: Cigarettes    Start date: 04/11/1956    Quit date: 04/17/2014    Years since quitting: 6.2  . Smokeless tobacco: Never Used  Vaping Use  . Vaping Use: Never used  Substance and Sexual Activity  . Alcohol use: No    Alcohol/week: 0.0 standard  drinks  . Drug use: No  . Sexual activity: Never  Other Topics Concern  . Not on file  Social History Narrative  . Not on file   Social Determinants of Health   Financial Resource Strain: Not on file  Food Insecurity: Not on file  Transportation Needs: Not on file  Physical Activity: Not on file  Stress: Not on file  Social Connections: Not on file  Intimate Partner Violence: Not on file    Physical Exam Vitals reviewed.  Constitutional:      Appearance: Normal appearance. He is normal weight.  HENT:     Head: Normocephalic.  Nose: Nose normal.     Mouth/Throat:     Mouth: Mucous membranes are moist.     Pharynx: Oropharynx is clear.  Eyes:     Pupils: Pupils are equal, round, and reactive to light.  Cardiovascular:     Rate and Rhythm: Normal rate and regular rhythm.     Pulses: Normal pulses.     Heart sounds: Normal heart sounds.  Pulmonary:     Effort: Pulmonary effort is normal.     Breath sounds: Normal breath sounds.  Abdominal:     General: Abdomen is flat.     Palpations: Abdomen is soft.  Musculoskeletal:        General: No swelling. Normal range of motion.     Cervical back: Normal range of motion.     Right lower leg: No edema.     Left lower leg: No edema.  Skin:    General: Skin is warm and dry.     Capillary Refill: Capillary refill takes less than 2 seconds.  Neurological:     General: No focal deficit present.     Mental Status: He is alert. Mental status is at baseline.  Psychiatric:        Mood and Affect: Mood normal.     Arrived for home visit for Brent [name] who was alert and oriented reporting to be feeling fine. Brent [name] denied any complaints of chest pain, shortness of breath, dizziness or swelling. No edema noted. Lung sounds clear. Vitals as noted in report. Brent [name] has been compliant with his medications over the last week. However he had not yet taken them upon our visit. I ensured Brent [name] his morning medications prior to me leaving.  I reviewed medications and filled pill box accordingly. Brent [name] and was reminded of his upcoming appointments. Home visit complete.   Refills: Allupurinol Eliquis Jardiance Remeron      Future Appointments  Date Time Provider [place]  07/20/2020  9:00 AM [facility] RM 2 [facility] None  10/02/2020  7:00 AM [facility] DEVICE REMOTES [facility]  10/30/2020  1:00 PM Wardell Honour, MD [facility] None  01/01/2021  7:00 AM [facility] DEVICE REMOTES [facility]  01/29/2021  1:00 PM [name], Dinah C, NP [facility] None  04/02/2021  7:00 AM [facility] DEVICE REMOTES [facility]  07/02/2021  7:00 AM [facility] DEVICE REMOTES [facility]     ACTION: Home visit completed Next visit planned for one week

## 2020-07-20 ENCOUNTER — Other Ambulatory Visit: Payer: Self-pay

## 2020-07-20 ENCOUNTER — Non-Acute Institutional Stay (HOSPITAL_COMMUNITY)
Admission: RE | Admit: 2020-07-20 | Discharge: 2020-07-20 | Disposition: A | Discharge status: Home / Self Care | Payer: Medicare Other | Source: Ambulatory Visit | Attending: Internal Medicine | Admitting: Internal Medicine

## 2020-07-20 DIAGNOSIS — D631 Anemia in chronic kidney disease: Secondary | ICD-10-CM | POA: Diagnosis not present

## 2020-07-20 DIAGNOSIS — N189 Chronic kidney disease, unspecified: Secondary | ICD-10-CM | POA: Insufficient documentation

## 2020-07-20 LAB — IRON AND TIBC
Iron: 51 ug/dL (ref 45–182)
Saturation Ratios: 27 % (ref 17.9–39.5)
TIBC: 189 ug/dL — ABNORMAL LOW (ref 250–450)
UIBC: 138 ug/dL

## 2020-07-20 LAB — HEMOGLOBIN AND HEMATOCRIT, BLOOD
HCT: 36 % — ABNORMAL LOW (ref 39.0–52.0)
Hemoglobin: 11.1 g/dL — ABNORMAL LOW (ref 13.0–17.0)

## 2020-07-20 LAB — FERRITIN: Ferritin: 845 ng/mL — ABNORMAL HIGH (ref 24–336)

## 2020-07-20 MED ORDER — EPOETIN ALFA 10000 UNIT/ML IJ SOLN
10000.0000 [IU] | INTRAMUSCULAR | Status: DC
  Administered 2020-07-20: 10000 [IU] via SUBCUTANEOUS
  Filled 2020-07-20: qty 1

## 2020-07-20 MED ORDER — SODIUM CHLORIDE 0.9 % IV SOLN
510.0000 mg | Freq: Once | INTRAVENOUS | Status: AC
  Administered 2020-07-20: 510 mg via INTRAVENOUS
  Filled 2020-07-20: qty 510

## 2020-07-20 MED ORDER — SODIUM CHLORIDE 0.9 % IV SOLN
INTRAVENOUS | Status: DC | PRN
  Administered 2020-07-20: 250 mL via INTRAVENOUS

## 2020-07-20 NOTE — Progress Notes (Signed)
PATIENT CARE CENTER NOTE  Diagnosis:Anemia associated with Chronic Renal Failure, anemia associated with renal disease   Provider:Patel, Ulice Dash MD   Procedure:Feraheme infusion and Epoetin Alfa (Procrit) injection   Note:Patient received second Feraheme infusion via PIV. Tolerated well with no adverse reaction. Observed patient for 30 minutes post-infusion.  Sub-qProcritinjection given inleftarm. Labs drawn pre-injection and Hemoglobin was11.1. Patient'sBPwnl at111/71.Discharge instructions given.Patient having difficulty ambulating today. Per patient, this has happened in the past and he will contact his PCP regarding this issue. This RN offered to call patient's sister to come and assist him. Patient refused. Patient alert, oriented and ambulatory to wheelchairat discharge. Patient transported to his car in wheelchair.  Called patient and verified that he made it home safely.

## 2020-07-20 NOTE — Discharge Instructions (Signed)
Ferumoxytol injection What is this medicine? FERUMOXYTOL is an iron complex. Iron is used to make healthy red blood cells, which carry oxygen and nutrients throughout the body. This medicine is used to treat iron deficiency anemia. This medicine may be used for other purposes; ask your health care provider or pharmacist if you have questions. COMMON BRAND NAME(S): Feraheme What should I tell my health care provider before I take this medicine? They need to know if you have any of these conditions:  anemia not caused by low iron levels  high levels of iron in the blood  magnetic resonance imaging (MRI) test scheduled  an unusual or allergic reaction to iron, other medicines, foods, dyes, or preservatives  pregnant or trying to get pregnant  breast-feeding How should I use this medicine? This medicine is for injection into a vein. It is given by a health care professional in a hospital or clinic setting. Talk to your pediatrician regarding the use of this medicine in children. Special care may be needed. Overdosage: If you think you have taken too much of this medicine contact a poison control center or emergency room at once. NOTE: This medicine is only for you. Do not share this medicine with others. What if I miss a dose? It is important not to miss your dose. Call your doctor or health care professional if you are unable to keep an appointment. What may interact with this medicine? This medicine may interact with the following medications:  other iron products This list may not describe all possible interactions. Give your health care provider a list of all the medicines, herbs, non-prescription drugs, or dietary supplements you use. Also tell them if you smoke, drink alcohol, or use illegal drugs. Some items may interact with your medicine. What should I watch for while using this medicine? Visit your doctor or healthcare professional regularly. Tell your doctor or healthcare  professional if your symptoms do not start to get better or if they get worse. You may need blood work done while you are taking this medicine. You may need to follow a special diet. Talk to your doctor. Foods that contain iron include: whole grains/cereals, dried fruits, beans, or peas, leafy green vegetables, and organ meats (liver, kidney). What side effects may I notice from receiving this medicine? Side effects that you should report to your doctor or health care professional as soon as possible:  allergic reactions like skin rash, itching or hives, swelling of the face, lips, or tongue  breathing problems  changes in blood pressure  feeling faint or lightheaded, falls  fever or chills  flushing, sweating, or hot feelings  swelling of the ankles or feet Side effects that usually do not require medical attention (report to your doctor or health care professional if they continue or are bothersome):  diarrhea  headache  nausea, vomiting  stomach pain This list may not describe all possible side effects. Call your doctor for medical advice about side effects. You may report side effects to FDA at 1-800-FDA-1088. Where should I keep my medicine? This drug is given in a hospital or clinic and will not be stored at home. NOTE: This sheet is a summary. It may not cover all possible information. If you have questions about this medicine, talk to your doctor, pharmacist, or health care provider.  2021 Elsevier/Gold Standard (2016-03-31 20:21:10) Epoetin Alfa injection What is this medicine? EPOETIN ALFA (e POE e tin AL fa) helps your body make more red blood cells. This  medicine is used to treat anemia caused by chronic kidney disease, cancer chemotherapy, or HIV-therapy. It may also be used before surgery if you have anemia. This medicine may be used for other purposes; ask your health care provider or pharmacist if you have questions. COMMON BRAND NAME(S): Epogen, Procrit,  Retacrit What should I tell my health care provider before I take this medicine? They need to know if you have any of these conditions:  cancer  heart disease  high blood pressure  history of blood clots  history of stroke  low levels of folate, iron, or vitamin B12 in the blood  seizures  an unusual or allergic reaction to erythropoietin, albumin, benzyl alcohol, hamster proteins, other medicines, foods, dyes, or preservatives  pregnant or trying to get pregnant  breast-feeding How should I use this medicine? This medicine is for injection into a vein or under the skin. It is usually given by a health care professional in a hospital or clinic setting. If you get this medicine at home, you will be taught how to prepare and give this medicine. Use exactly as directed. Take your medicine at regular intervals. Do not take your medicine more often than directed. It is important that you put your used needles and syringes in a special sharps container. Do not put them in a trash can. If you do not have a sharps container, call your pharmacist or healthcare provider to get one. A special MedGuide will be given to you by the pharmacist with each prescription and refill. Be sure to read this information carefully each time. Talk to your pediatrician regarding the use of this medicine in children. While this drug may be prescribed for selected conditions, precautions do apply. Overdosage: If you think you have taken too much of this medicine contact a poison control center or emergency room at once. NOTE: This medicine is only for you. Do not share this medicine with others. What if I miss a dose? If you miss a dose, take it as soon as you can. If it is almost time for your next dose, take only that dose. Do not take double or extra doses. What may interact with this medicine? Interactions have not been studied. This list may not describe all possible interactions. Give your health care  provider a list of all the medicines, herbs, non-prescription drugs, or dietary supplements you use. Also tell them if you smoke, drink alcohol, or use illegal drugs. Some items may interact with your medicine. What should I watch for while using this medicine? Your condition will be monitored carefully while you are receiving this medicine. You may need blood work done while you are taking this medicine. This medicine may cause a decrease in vitamin B6. You should make sure that you get enough vitamin B6 while you are taking this medicine. Discuss the foods you eat and the vitamins you take with your health care professional. What side effects may I notice from receiving this medicine? Side effects that you should report to your doctor or health care professional as soon as possible:  allergic reactions like skin rash, itching or hives, swelling of the face, lips, or tongue  seizures  signs and symptoms of a blood clot such as breathing problems; changes in vision; chest pain; severe, sudden headache; pain, swelling, warmth in the leg; trouble speaking; sudden numbness or weakness of the face, arm or leg  signs and symptoms of a stroke like changes in vision; confusion; trouble speaking or understanding;  severe headaches; sudden numbness or weakness of the face, arm or leg; trouble walking; dizziness; loss of balance or coordination Side effects that usually do not require medical attention (report to your doctor or health care professional if they continue or are bothersome):  chills  cough  dizziness  fever  headaches  joint pain  muscle cramps  muscle pain  nausea, vomiting  pain, redness, or irritation at site where injected This list may not describe all possible side effects. Call your doctor for medical advice about side effects. You may report side effects to FDA at 1-800-FDA-1088. Where should I keep my medicine? Keep out of the reach of children. Store in a  refrigerator between 2 and 8 degrees C (36 and 46 degrees F). Do not freeze or shake. Throw away any unused portion if using a single-dose vial. Multi-dose vials can be kept in the refrigerator for up to 21 days after the initial dose. Throw away unused medicine. NOTE: This sheet is a summary. It may not cover all possible information. If you have questions about this medicine, talk to your doctor, pharmacist, or health care provider.  2021 Elsevier/Gold Standard (2016-09-19 08:35:19)

## 2020-07-25 ENCOUNTER — Other Ambulatory Visit (HOSPITAL_COMMUNITY): Payer: Self-pay

## 2020-07-25 ENCOUNTER — Ambulatory Visit: Payer: Medicare Other | Admitting: Adult Health

## 2020-07-25 NOTE — Progress Notes (Signed)
Paramedicine Encounter    Patient ID: Brent [name], male    DOB: [birth year], 75 y.o.   MRN: [id number]   Patient Care Team: Wardell Honour, MD as PCP - General (Family Medicine) [place], Peter M, MD as PCP - Cardiology (Cardiology) Larey Dresser, MD as PCP - Advanced Heart Failure (Cardiology) Elmarie Shiley, MD (Nephrology) Milus Banister, MD (Gastroenterology) Jorge Ny, LCSW as Social Worker (Licensed Clinical Social Worker)  Patient Active Problem List   Diagnosis Date Noted  . Electrolyte abnormality 05/17/2020  . ICD (implantable cardioverter-defibrillator) in place 04/12/2020  . Chronic systolic heart failure ([place]) 01/02/2020  . Decreased appetite 10/08/2019  . Weakness 10/08/2019  . Hyperkalemia 08/26/2019  . Chronic systolic CHF (congestive heart failure) ([place]) 08/26/2019  . Type 2 diabetes mellitus with stage 3 chronic kidney disease ([place]) 08/26/2019  . Chronic obstructive pulmonary disease ([place]) 06/20/2019  . Ventricular tachycardia ([place]) 06/20/2019  . Acute on chronic systolic (congestive) heart failure ([place]) 04/25/2019  . Metabolic acidosis 54/62/7035  . Orthostasis 09/07/2018  . AKI (acute kidney injury) ([place])   . Immunosuppressed status ([place])   . Macrocytic anemia   . Chronic combined systolic and diastolic congestive heart failure ([place]) 06/03/2018  . Candida esophagitis ([place]) 09/22/2017  . History of smoking 30 or more pack years 01/05/2017  . Bipolar 1 disorder ([place])   . BPH (benign prostatic hyperplasia) 03/31/2013  . 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 Essential hypertension, benign 03/31/2013  . Acute renal failure superimposed on stage 3 chronic kidney disease ([place]) 06/04/2012  . 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[place]) 01/23/2010    Current Outpatient Medications:  .  acetaminophen (TYLENOL) 160 MG/5ML liquid, Take 325 mg by mouth every 4 (four) hours as needed for fever., Disp: , Rfl:  .  allopurinol (ZYLOPRIM) 100 MG tablet, Take 2 tablets  (200 mg total) by mouth daily., Disp: 60 tablet, Rfl: 5 .  apixaban (ELIQUIS) 5 MG TABS tablet, Take 1 tablet (5 mg total) by mouth 2 (two) times daily., Disp: 180 tablet, Rfl: 3 .  BUDESONIDE PO, Take 9 mg by mouth daily., Disp: , Rfl:  .  Calcium 500 MG tablet, Take 1 tablet (500 mg total) by mouth 1 day or 1 dose for 1 dose., Disp: 30 tablet, Rfl:  .  Cholecalciferol (VITAMIN D) 125 MCG (5000 UT) CAPS, Take 5,000 Units by mouth daily. , Disp: , Rfl:  .  dapagliflozin propanediol (FARXIGA) 10 MG TABS tablet, Take 1 tablet (10 mg total) by mouth daily before breakfast., Disp: 90 tablet, Rfl: 3 .  divalproex (DEPAKOTE ER) 500 MG 24 hr tablet, Take 1,500 mg by mouth at bedtime. , Disp: , Rfl:  .  empagliflozin (JARDIANCE) 25 MG TABS tablet, Take 25 mg by mouth daily. Take 1/2 tablet once daily., Disp: , Rfl:  .  Ensure (ENSURE), Take 1 Can by mouth 3 (three) times daily. (Patient not taking: Reported on 07/19/2020), Disp: , Rfl:  .  epoetin alfa (EPOGEN) 10000 UNIT/ML injection, Inject 10,000 Units into the skin every 30 (thirty) days., Disp: , Rfl:  .  ferrous sulfate 325 (65 FE) MG tablet, Take 325 mg by mouth in the morning and at bedtime., Disp: , Rfl:  .  furosemide (LASIX) 20 MG tablet, Take 1 tablet (20 mg total) by mouth every Monday, Wednesday, and Friday., Disp: 39 tablet, Rfl: 3 .  hydrALAZINE (APRESOLINE) 25 MG tablet, Take 25 mg by mouth 3 (three) times daily., Disp: , Rfl:  .  inFLIXimab (REMICADE) 100 MG injection, Infuse Remicade IV schedule 1 32m/kg every 8 weeks Premedicate with Tylenol 500-6558mby mouth and Benadryl 25-502my mouth prior to infusion. Last PPD was on 12/2009. , Disp: 1 each, Rfl: 6 .  isosorbide mononitrate (IMDUR) 60 MG 24 hr tablet, Take 1 tablet (60 mg total) by mouth daily., Disp: 90 tablet, Rfl: 3 .  ivabradine (CORLANOR) 5 MG TABS tablet, TAKE 1 TABLET(5 MG) BY MOUTH TWICE DAILY WITH A MEAL, Disp: 180 tablet, Rfl: 3 .  loperamide (IMODIUM) 2 MG capsule, Take 2  mg by mouth 3 (three) times daily as needed for diarrhea or loose stools., Disp: , Rfl:  .  Magnesium 400 MG TABS, Take 400 mg by mouth daily., Disp: 30 tablet, Rfl: 1 .  metoprolol succinate (TOPROL-XL) 25 MG 24 hr tablet, Take 1 tablet (25 mg total) by mouth in the morning and at bedtime., Disp: 180 tablet, Rfl: 6 .  mirtazapine (REMERON) 7.5 MG tablet, TAKE 1 TABLET(7.5 MG) BY MOUTH AT BEDTIME, Disp: 30 tablet, Rfl: 3 .  omeprazole (PRILOSEC) 20 MG capsule, Take 20 mg by mouth daily., Disp: , Rfl:  .  prazosin (MINIPRESS) 1 MG capsule, Take 1 mg by mouth at bedtime. (Patient not taking: No sig reported), Disp: , Rfl:  .  QUEtiapine (SEROQUEL) 100 MG tablet, Take 150 mg by mouth at bedtime., Disp: , Rfl:  .  solifenacin (VESICARE) 5 MG tablet, Take 5 mg by mouth daily., Disp: , Rfl:  .  tamsulosin (FLOMAX) 0.4 MG CAPS capsule, Take 1 capsule (0.4 mg total) by mouth daily., Disp: 30 capsule, Rfl: 2 .  vitamin B-12 (CYANOCOBALAMIN) 500 MCG tablet, Take 1,000 mcg by mouth daily. , Disp: , Rfl:  Allergies  Allergen Reactions  . Azathioprine Other (See Comments)    REACTION: affected WBC "Almost died"  . Ciprofloxacin Other (See Comments)    Reaction not recalled  . Levaquin [Levofloxacin In D5w] Other (See Comments)    Reaction not rec  . Plendil [Felodipine] Other (See Comments)    Reaction not not recalled     Social History   Socioeconomic History  . Marital status: Divorced    Spouse name: Not on file  . Number of children: 1  . Years of education: 12 41 Highest education level: Not on file  Occupational History  . Occupation: retired  . Occupation: Veteran  Tobacco Use  . Smoking status: Former Smoker    Packs/day: 1.00    Years: 49.00    Pack years: 49.00    Types: Cigarettes    Start date: 04/11/1956    Quit date: 04/17/2014    Years since quitting: 6.2  . Smokeless tobacco: Never Used  Vaping Use  . Vaping Use: Never used  Substance and Sexual Activity  . Alcohol  use: No    Alcohol/week: 0.0 standard drinks  . Drug use: No  . Sexual activity: Never  Other Topics Concern  . Not on file  Social History Narrative  . Not on file   Social Determinants of Health   Financial Resource Strain: Not on file  Food Insecurity: Not on file  Transportation Needs: Not on file  Physical Activity: Not on file  Stress: Not on file  Social Connections: Not on file  Intimate Partner Violence: Not on file    Physical Exam Vitals reviewed.  Constitutional:      Appearance: Normal appearance. He is normal weight.  HENT:     Head: Normocephalic.  Nose: Nose normal.     Mouth/Throat:     Mouth: Mucous membranes are moist.     Pharynx: Oropharynx is clear.  Eyes:     Conjunctiva/sclera: Conjunctivae normal.     Pupils: Pupils are equal, round, and reactive to light.  Cardiovascular:     Rate and Rhythm: Normal rate and regular rhythm.     Pulses: Normal pulses.     Heart sounds: Normal heart sounds.  Pulmonary:     Effort: Pulmonary effort is normal.     Breath sounds: Normal breath sounds.  Abdominal:     General: Abdomen is flat.     Palpations: Abdomen is soft.  Musculoskeletal:        General: No swelling. Normal range of motion.     Cervical back: Normal range of motion.     Right lower leg: No edema.     Left lower leg: No edema.  Skin:    General: Skin is warm and dry.     Capillary Refill: Capillary refill takes less than 2 seconds.  Neurological:     General: No focal deficit present.     Mental Status: He is alert. Mental status is at baseline.  Psychiatric:        Mood and Affect: Mood normal.    Arrived for home visit for Brent [name] who was alert and oriented reporting to be feeling great. Brent [name] states that he has been feeling really good over the last few days. Brent [name] has been compliant with his medications over the last week. I obtained vitals and noted Brent [name]'s blood pressure was elevated. He had not yet had his morning  medications. I included the Hydralazine as instructed by [place] for todays dose due to SBP>130. Ayush denied dizziness, headache, vision changes, chest pain or nausea. Brent [name] medications were reviewed and confirmed. Pill box filled accordingly.  We reviewed appointments and confirmed same. I encouraged Brent [name] to stay away from salty, fried foods. He verbalized understanding. Home visit complete. I will return in one week for follow up.    Refills: Mirtazipine Eliquis Jardiance Allupurinol        Future Appointments  Date Time Provider [place]  07/26/2020  2:00 PM Medina-Vargas, Senaida Lange, NP [facility] None  08/20/2020 10:00 AM [facility] RM 2 [facility] None  10/02/2020  7:00 AM [facility] DEVICE REMOTES [facility]  10/30/2020  1:00 PM Wardell Honour, MD [facility] None  01/01/2021  7:00 AM [facility] DEVICE REMOTES [facility]  01/29/2021  1:00 PM [name], Dinah C, NP [facility] None  04/02/2021  7:00 AM [facility] DEVICE REMOTES [facility]  07/02/2021  7:00 AM [facility] DEVICE REMOTES [facility]     ACTION: Home visit completed Next visit planned for one week

## 2020-07-25 NOTE — Progress Notes (Signed)
Remote ICD transmission.   

## 2020-07-26 ENCOUNTER — Encounter: Payer: Self-pay | Admitting: Adult Health

## 2020-07-26 ENCOUNTER — Ambulatory Visit (INDEPENDENT_AMBULATORY_CARE_PROVIDER_SITE_OTHER): Payer: Medicare Other | Admitting: Adult Health

## 2020-07-26 ENCOUNTER — Other Ambulatory Visit: Payer: Self-pay

## 2020-07-26 VITALS — BP 138/88 | HR 78 | Temp 97.1°F | Resp 16 | Ht 76.0 in | Wt 176.4 lb

## 2020-07-26 DIAGNOSIS — R2681 Unsteadiness on feet: Secondary | ICD-10-CM

## 2020-07-26 NOTE — Progress Notes (Signed)
[facility] clinic  Provider:   Durenda Age  DNP  Code Status: Full Code  Goals of Care:  Advanced Directives 07/26/2020  Does Patient Have a Medical Advance Directive? Yes  Type of Advance Directive -  Does patient want to make changes to medical advance directive? No - Patient declined  Copy of [place] in Chart? -  Would patient like information on creating a medical advance directive? -  Pre-existing out of facility DNR order (yellow form or pink MOST form) -    Chief Complaint  Patient presents with   Acute Visit    Complains of difficulty walking with no pain. However patient has to slide to walk.     HPI: Patient is a 75 y.o. male seen today for an acute visit for difficulty walking. He has shuffling gait. He denies any pain or any trauma. He sated that this started 3 months ago. He was noted to have difficulty lifting his feet and slides it at times. He denies having had trauma, no vision problems, no hallucinations. He has a medical history is significant for gout, combined systolic and diastolic heart failure, hypertension, chron's disease, CAD and anemia.   Past Medical History:  Diagnosis Date   Anemia    Anxiety    Benign paroxysmal positional vertigo    Bipolar I disorder, most recent episode (or current) unspecified    Celiac disease    CHF (congestive heart failure) (HCC)    Chronic kidney disease, stage III (moderate) (HCC)    Crohn's    Remicade q8 weeks   Depression    Essential and other specified forms of tremor    medication-induced Parkinson's, now resolved   Essential hypertension, benign    Gout 2018   Heart failure ([place])    Hypertrophy of prostate without urinary obstruction and other lower urinary tract symptoms (LUTS)    Impotence of organic origin    Insomnia with sleep apnea, unspecified    Iron deficiency anemia, unspecified    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   Neuralgia, neuritis, and radiculitis, unspecified    Other  B-complex deficiencies    Other extrapyramidal disease and abnormal movement disorder    Postinflammatory pulmonary fibrosis ([place])    Tobacco use disorder    Vertigo 2018    Past Surgical History:  Procedure Laterality Date   CATARACT EXTRACTION, BILATERAL Bilateral 09/2018   CHOLECYSTECTOMY  07-12-2010   ICD IMPLANT N/A 01/02/2020   Procedure: ICD IMPLANT;  Surgeon: Evans Lance, MD;  Location: [facility];  Service: Cardiovascular;  Laterality: N/A;   RIGHT HEART CATH N/A 10/12/2018   Procedure: RIGHT HEART CATH;  Surgeon: Jolaine Artist, MD;  Location: [facility];  Service: Cardiovascular;  Laterality: N/A;   RIGHT/LEFT HEART CATH AND CORONARY ANGIOGRAPHY N/A 04/26/2019   Procedure: RIGHT/LEFT HEART CATH AND CORONARY ANGIOGRAPHY;  Surgeon: Larey Dresser, MD;  Location: [facility];  Service: Cardiovascular;  Laterality: N/A;   SMALL INTESTINE SURGERY     x 2    Allergies  Allergen Reactions   Azathioprine Other (See Comments)    REACTION: affected WBC "Almost died"   Ciprofloxacin Other (See Comments)    Reaction not recalled   Levaquin [Levofloxacin In D5w] Other (See Comments)    Reaction not rec   Plendil [Felodipine] Other (See Comments)    Reaction not not recalled    Outpatient Encounter Medications as of 07/26/2020  Medication Sig   acetaminophen (TYLENOL) 160  MG/5ML liquid Take 325 mg by mouth every 4 (four) hours as needed for fever.   allopurinol (ZYLOPRIM) 100 MG tablet Take 2 tablets (200 mg total) by mouth daily.   apixaban (ELIQUIS) 5 MG TABS tablet Take 1 tablet (5 mg total) by mouth 2 (two) times daily.   BUDESONIDE PO Take 9 mg by mouth daily.   Cholecalciferol (VITAMIN D) 125 MCG (5000 UT) CAPS Take 5,000 Units by mouth daily.    dapagliflozin propanediol (FARXIGA) 10 MG TABS tablet Take 1 tablet (10 mg total) by mouth daily before breakfast.   divalproex (DEPAKOTE ER) 500 MG 24 hr tablet Take 1,500 mg by mouth at bedtime.     empagliflozin (JARDIANCE) 25 MG TABS tablet Take 25 mg by mouth daily. Take 1/2 tablet once daily.   Ensure (ENSURE) Take 1 Can by mouth 3 (three) times daily.   epoetin alfa (EPOGEN) 10000 UNIT/ML injection Inject 10,000 Units into the skin every 30 (thirty) days.   ferrous sulfate 325 (65 FE) MG tablet Take 325 mg by mouth in the morning and at bedtime.   furosemide (LASIX) 20 MG tablet Take 1 tablet (20 mg total) by mouth every Monday, Wednesday, and Friday.   hydrALAZINE (APRESOLINE) 25 MG tablet Take 25 mg by mouth 3 (three) times daily.   inFLIXimab (REMICADE) 100 MG injection Infuse Remicade IV schedule 1 20m/kg every 8 weeks Premedicate with Tylenol 500-6529mby mouth and Benadryl 25-5082my mouth prior to infusion. Last PPD was on 12/2009.    isosorbide mononitrate (IMDUR) 60 MG 24 hr tablet Take 1 tablet (60 mg total) by mouth daily.   ivabradine (CORLANOR) 5 MG TABS tablet TAKE 1 TABLET(5 MG) BY MOUTH TWICE DAILY WITH A MEAL   loperamide (IMODIUM) 2 MG capsule Take 2 mg by mouth 3 (three) times daily as needed for diarrhea or loose stools.   Magnesium 400 MG TABS Take 400 mg by mouth daily.   metoprolol succinate (TOPROL-XL) 25 MG 24 hr tablet Take 1 tablet (25 mg total) by mouth in the morning and at bedtime.   mirtazapine (REMERON) 7.5 MG tablet TAKE 1 TABLET(7.5 MG) BY MOUTH AT BEDTIME   omeprazole (PRILOSEC) 20 MG capsule Take 20 mg by mouth daily.   prazosin (MINIPRESS) 1 MG capsule Take 1 mg by mouth at bedtime.   QUEtiapine (SEROQUEL) 100 MG tablet Take 150 mg by mouth at bedtime.   solifenacin (VESICARE) 5 MG tablet Take 5 mg by mouth daily.   tamsulosin (FLOMAX) 0.4 MG CAPS capsule Take 1 capsule (0.4 mg total) by mouth daily.   vitamin B-12 (CYANOCOBALAMIN) 500 MCG tablet Take 1,000 mcg by mouth daily.    Calcium 500 MG tablet Take 1 tablet (500 mg total) by mouth 1 day or 1 dose for 1 dose.   No facility-administered encounter medications on file as of 07/26/2020.     Review of Systems:  Review of Systems  Constitutional: Positive for activity change. Negative for fever.  HENT: Negative for congestion.   Eyes: Negative for discharge and itching.  Respiratory: Negative for cough and wheezing.   Cardiovascular: Negative for leg swelling.  Gastrointestinal: Negative for abdominal pain, constipation, nausea and vomiting.  Genitourinary: Negative for difficulty urinating and dysuria.  Musculoskeletal: Positive for gait problem. Negative for joint swelling, neck pain and neck stiffness.  Skin: Negative.  Negative for color change and rash.    Health Maintenance  Topic Date Due   HEMOGLOBIN A1C  02/27/2020   FOOT EXAM  08/09/2020   INFLUENZA VACCINE  09/24/2020   TETANUS/TDAP  05/25/2021   OPHTHALMOLOGY EXAM  06/28/2021   COLONOSCOPY (Pts 45-89yr Insurance coverage will need to be confirmed)  09/17/2027   COVID-19 Vaccine  Completed   Hepatitis C Screening  Completed   PNA vac Low Risk Adult  Completed   Zoster Vaccines- Shingrix  Completed   HPV VACCINES  Aged Out    Physical Exam: Vitals:   07/26/20 1404  Height: 6' 4"  (1.93 m)   Body mass index is 21.35 kg/m. Physical Exam Constitutional:      Appearance: Normal appearance.  HENT:     Head: Normocephalic and atraumatic.  Eyes:     Conjunctiva/sclera: Conjunctivae normal.  Cardiovascular:     Rate and Rhythm: Normal rate and regular rhythm.     Pulses: Normal pulses.     Heart sounds: Normal heart sounds.  Pulmonary:     Effort: Pulmonary effort is normal.     Breath sounds: Normal breath sounds.  Abdominal:     General: Bowel sounds are normal.     Palpations: Abdomen is soft.  Musculoskeletal:     Cervical back: Normal range of motion and neck supple.  Skin:    General: Skin is warm and dry.  Neurological:     Mental Status: He is alert and oriented to person, place, and time.     Motor: Weakness present.     Gait: Gait abnormal.  Psychiatric:        Mood and  Affect: Mood normal.        Behavior: Behavior normal.     Labs reviewed: Basic Metabolic Panel: Recent Labs    08/27/19 0201 08/28/19 0323 09/06/19 1559 09/28/19 0946 10/20/19 1158 11/14/19 1413 05/18/20 0524 05/18/20 1451 05/19/20 0234 06/26/20 1434  NA 140 138   < > 141 142   < > 141 140 142 142  K 5.5* 5.3*   < > 5.2 5.7*   < > 2.8* 3.0* 3.5 2.9*  CL 107 103   < > 114* 112*   < > 112* 112* 114* 107  CO2 25 27   < > 20 27   < > 20* 22 20* 22  GLUCOSE 127* 97   < > 102* 78   < > 74 116* 87 96  BUN 36* 34*   < > 42* 27*   < > 24* 22 21 38*  CREATININE 2.52* 2.57*   < > 2.55* 2.08*   < > 1.78* 1.99* 1.80* 2.90*  CALCIUM 8.4* 8.6*   < > 8.9 9.2   < > 6.2*  6.1 6.8* 7.2* 8.2*  MG 1.8 1.6*   < >  --   --    < > 0.9* 1.3*  --  1.2*  PHOS 4.4 3.9  --   --   --   --   --   --   --   --   TSH  --   --   --  1.22 2.46  --   --   --   --   --    < > = values in this interval not displayed.   Liver Function Tests: Recent Labs    08/27/19 0201 08/28/19 0323 09/06/19 1559 09/28/19 0946 10/20/19 1158 05/18/20 0524  AST 11* 11*   < > 8* 11 11*  11*  ALT 9 10   < > 5* 8* 10  9  ALKPHOS 56 57  --   --   --  43  44  BILITOT 0.4 0.4   < > 0.3 0.3 0.6  0.5  PROT 5.6* 6.2*   < > 6.7 6.7 5.9*  5.9*  ALBUMIN 2.8* 3.0*  --   --   --  2.3*  2.3*   < > = values in this interval not displayed.   No results for input(s): LIPASE, AMYLASE in the last 8760 hours. No results for input(s): AMMONIA in the last 8760 hours. CBC: Recent Labs    12/30/19 1025 01/16/20 1015 05/17/20 1814 05/18/20 0524 05/19/20 0234 05/25/20 1035 06/22/20 1048 07/20/20 0850  WBC 10.7  --  11.1* 9.4 9.5  --   --   --   NEUTROABS 6.1  --  6.8 5.6  --   --   --   --   HGB 12.2*   < > 11.0* 9.8* 10.0* 10.6* 10.4* 11.1*  HCT 37.1*   < > 35.5* 30.7* 31.8* 33.8* 34.1* 36.0*  MCV 101*  --  107.6* 104.4* 103.9*  --   --   --   PLT 177  --  133* 123* 130*  --   --   --    < > = values in this interval not  displayed.   Lipid Panel: Recent Labs    10/20/19 1158  CHOL 161  HDL 54  LDLCALC 86  TRIG 112  CHOLHDL 3.0   Lab Results  Component Value Date   HGBA1C 5.1 08/27/2019    Procedures since last visit: [place]  Result Date: 07/03/2020 Scheduled remote reviewed. Normal device function.   1 NSVT, short Next remote 91 days- [facility], RN/CVRS   Assessment/Plan  1. Gait instability -   it's like a parkinsonian gait but has no tremors noted -will refer him for home health PT for gait training exercises and neurology for further evaluation - Ambulatory referral to [place] - Ambulatory referral to Neurology    Labs/tests ordered:  None   Next appt:  10/30/2020

## 2020-07-26 NOTE — Patient Instructions (Addendum)
Ataxia  Ataxia is a condition that causes unsteadiness when walking and standing, poor coordination of body movements, and difficulty keeping a straight (upright) posture. It occurs because of a problem with the part of the brain that controls coordination and stability (cerebellum). Ataxia can develop later in life (acquired ataxia), during your 20s or 30s or even into your 60s or later. This type of ataxia develops when another medical condition, such as a stroke, damages the cerebellum. Ataxia also may be present early in life (non-acquired ataxia). There are two main types of non-acquired ataxia:  Congenital. This type is present at birth.  Hereditary. This type is passed from parent to child. The most common form of hereditary non-acquired ataxia is Friedreich ataxia. What are the causes? Acquired ataxia may be caused by:  Changes in the nervous system (neurodegenerative changes).  Changes throughout the body (systemic disorders).  A lot of exposure to: ? Certain medicines such as phenytoin and lithium. ? Solvents. These are cleaning fluids such as paint thinner, nail polish remover, carpet cleaner, and degreasers.  Alcohol abuse (alcoholism).  Medical conditions, such as: ? Celiac disease. ? Hypothyroidism. ? A lack (deficiency) of vitamin E, vitamin B12, or thiamine. ? Brain tumors. ? Multiple sclerosis. ? Cerebral palsy. ? Stroke. ? Paraneoplastic syndromes. ? Viral infections. ? Head injury. ? Malnutrition. Congenital and hereditary ataxia are caused by problems that are present in genes before birth. What are the signs or symptoms? Signs and symptoms of ataxia vary depending on the cause. They may include:  Being unsteady.  Walking with the legs wide apart (wide stance) to keep one's balance.  Uncontrolled shaking (tremor).  Poorly coordinated body movements.  Difficulty maintaining an upright posture.  Fatigue.  Changes in speech.  Changes in  vision.  Involuntary eye movements (nystagmus).  Difficulty swallowing.  Difficulty writing.  Muscle tightening that you cannot control (muscle spasms). How is this diagnosed? Ataxia may be diagnosed based on:  Your personal and family medical history.  A physical exam.  Imaging tests, such as a CT scan or MRI.  Spinal tap (lumbar puncture). This procedure involves using a needle to take a sample of the fluid around your brain and spinal cord.  Genetic testing. How is this treated? The underlying condition that causes your ataxia needs to be treated. If the cause is a brain tumor, you may need surgery. Treatment also focuses on helping you live with ataxia and improving your quality of life (supportive treatments). This may involve:  Learning ways to improve coordination and move around more carefully (physical therapy).  Learning ways to improve your ability to do daily tasks, such as bathing and feeding yourself (occupational therapy).  Using devices to help you move around, eat, or communicate (assistive devices), such as a walker, modified eating utensils, and communication aids.  Learning ways to improve speech and swallowing (speech therapy). Follow these instructions at home: Preventing falls  Lie down right away if you become very unsteady, dizzy, or nauseous, or if you feel like you are going to faint. Do not get up until all of those feelings pass.  Keep your home well-lit. Use night-lights as needed.  Remove tripping hazards, such as rugs, cords, and clutter.  Install grab bars by the toilet and in the tub and shower.  Use assistive devices such as a cane, walker, or wheelchair as needed to keep your balance. General instructions  Do not drink alcohol.  Ask your health care provider  what activities are safe for you, and what activities you should avoid.  Take over-the-counter and prescription medicines only as told by your health care provider. Get help  right away if you:  Have unsteadiness that suddenly worsens.  Have any of these: ? Severe headaches. ? Chest pain. ? Abdominal pain. ? Weakness or numbness on one side of your body. ? Vision problems. ? Difficulty speaking. ? An irregular heartbeat. ? A very fast pulse.  Feel confused. Summary  Ataxia is a condition that causes unsteadiness when walking and standing, poor coordination of body movements, and difficulty keeping a straight (upright) posture.  Ataxia occurs because of a problem with the part of the brain that controls coordination and stability (cerebellum).  The underlying condition that causes your ataxia needs to be treated. Treatment also focuses on helping you live with ataxia and improving your quality of life (supportive treatments).  Lie down right away if you become very unsteady, dizzy, or nauseous, or if you feel like you are going to faint. This information is not intended to replace advice given to you by your health care provider. Make sure you discuss any questions you have with your health care provider. Document Revised: 11/23/2019 Document Reviewed: 12/12/2016 Elsevier Patient Education  2021 [place].  Weakness Weakness is a lack of strength. You may feel weak all over your body (generalized), or you may feel weak in one part of your body (focal). There are many potential causes of weakness. Sometimes, the cause of your weakness may not be known. Some causes of weakness can be serious, so it is important to see your doctor. Follow these instructions at home: Activity  Rest as needed.  Try to get enough sleep. Most adults need 7-8 hours of sleep each night. Talk to your doctor about how much sleep you need each night.  Do exercises, such as arm curls and leg raises, for 30 minutes at least 2 days a week or as told by your doctor.  Think about working with a physical therapist or trainer to help you get stronger. General instructions  Take  over-the-counter and prescription medicines only as told by your doctor.  Eat a healthy, well-balanced diet. This includes: ? Proteins to build muscles, such as lean meats and fish. ? Fresh fruits and vegetables. ? Carbohydrates to boost energy, such as whole grains.  Drink enough fluid to keep your pee (urine) pale yellow.  Keep all follow-up visits as told by your doctor. This is important.   Contact a doctor if:  Your weakness does not get better or it gets worse.  Your weakness affects your ability to: ? Think clearly. ? Do your normal daily activities. Get help right away if you:  Have sudden weakness on one side of your face or body.  Have chest pain.  Have trouble breathing or shortness of breath.  Have problems with your vision.  Have trouble talking or swallowing.  Have trouble standing or walking.  Are light-headed.  Pass out (lose consciousness). Summary  Weakness is a lack of strength. You may feel weak all over your body or just in one part of your body.  There are many potential causes of weakness. Sometimes, the cause of your weakness may not be known.  Rest as needed, and try to get enough sleep. Most adults need 7-8 hours of sleep each night.  Eat a healthy, well-balanced diet. This information is not intended to replace advice given to you by your health care  provider. Make sure you discuss any questions you have with your health care provider. Document Revised: 09/16/2017 Document Reviewed: 09/16/2017 Elsevier Patient Education  [place].

## 2020-07-31 ENCOUNTER — Other Ambulatory Visit (HOSPITAL_COMMUNITY): Payer: Self-pay | Admitting: *Deleted

## 2020-08-01 ENCOUNTER — Other Ambulatory Visit (HOSPITAL_COMMUNITY): Payer: Self-pay

## 2020-08-01 NOTE — Progress Notes (Signed)
Paramedicine Encounter    Patient ID: Brent [name], male    DOB: 12/24/1945, 75 y.o.   MRN: [id number]   Completed two pill boxes for [name]. Medications were confirmed and verified.  [place] FROM LIST AND HOME. I will follow up in two weeks with pending APP clinic visit.   Refills: Divaprolex Alllupurinol Budesondie  ACTION: Home visit completed Next visit planned for two weeks

## 2020-08-07 ENCOUNTER — Telehealth: Payer: Self-pay | Admitting: Family Medicine

## 2020-08-07 ENCOUNTER — Telehealth: Payer: Self-pay | Admitting: *Deleted

## 2020-08-07 MED ORDER — ALLOPURINOL 100 MG PO TABS: 200.0000 mg | ORAL_TABLET | Freq: Every day | ORAL | 5 refills | Status: DC

## 2020-08-07 NOTE — Telephone Encounter (Signed)
-----   Message from Jeris Penta, EMT sent at 08/03/2020 11:02 AM EDT ----- Regarding: refill request Hello Dr. Sabra Heck, my name is Jeris Penta and I am Mr. [name] community paramedic who goes out to complete home visits to assist in medication management and compliance. [name] needs Allupurinol refills sent to [facility] as there are none left for [facility] and [facility].    Thank you.   [place] Paramedic  [facility]

## 2020-08-07 NOTE — Telephone Encounter (Signed)
Verdis Frederickson, Nurse with [facility] called requesting verbal orders for PT 1x1wk, 2x4wks, 1x4wks.  Verbal order given.

## 2020-08-09 DIAGNOSIS — H6123 Impacted cerumen, bilateral: Secondary | ICD-10-CM | POA: Diagnosis not present

## 2020-08-13 ENCOUNTER — Encounter: Payer: Self-pay | Admitting: Neurology

## 2020-08-13 ENCOUNTER — Telehealth: Payer: Self-pay | Admitting: Neurology

## 2020-08-13 ENCOUNTER — Ambulatory Visit (INDEPENDENT_AMBULATORY_CARE_PROVIDER_SITE_OTHER): Payer: Medicare Other | Admitting: Neurology

## 2020-08-13 VITALS — BP 160/94 | HR 79 | Ht 76.0 in | Wt 171.0 lb

## 2020-08-13 DIAGNOSIS — Z9181 History of falling: Secondary | ICD-10-CM | POA: Diagnosis not present

## 2020-08-13 DIAGNOSIS — R2689 Other abnormalities of gait and mobility: Secondary | ICD-10-CM

## 2020-08-13 DIAGNOSIS — R296 Repeated falls: Secondary | ICD-10-CM | POA: Diagnosis not present

## 2020-08-13 NOTE — Patient Instructions (Signed)
It was nice to see you again today.  As discussed, I believe you have a multifactorial gait disorder, meaning, that it is Brent [name] is due to a combination of factors. These factors include: Medication side effect which can affect your balance, deconditioning, dehydration, previous falls.  Please use your walker at all times.    I am worried about your driving, due to your coordination problem and difficulty with balance, I do not believe you are fully safe to drive.  Please also discuss this with your primary care.    Please remember to stand up slowly and get your bearings first turn slowly, no bending down to pick anything, no heavy lifting, be extra careful at night and first thing in the morning. Also, be careful in the Bathroom and the kitchen.   Remember to drink plenty of fluid, eat healthy meals and do not skip any meals. Try to eat protein with a every meal and eat a healthy snack such as fruit or nuts or yogurt in between meals. Try to keep a regular sleep-wake schedule and try to exercise daily, particularly in the form of walking, 20-30 minutes a day, if you can.  Try to drink about 6 to 8 cups of water per day, 8 ounce size each.    As far as diagnostic testing: As discussed, we will request a brain MRI.  We can compare with findings from 2019.  So long as your brain MRI is without any acute findings or specific issues that could affect your balance, you can follow-up with your primary care routinely.

## 2020-08-13 NOTE — Progress Notes (Addendum)
Subjective:    Patient ID: Brent [name] is a 75 y.o. male.  HPI    Star Age, MD, PhD [facility] [address] [place], [place] [address]  [name] is a 75 year old right-handed gentleman with an underlying complex medical history of hypertension, tremor, Celiac disease, vertigo, congestive heart failure, gout, insomnia, sleep apnea, smoking, chronic renal insufficiency, Crohn's disease, mood disorder, anemia, on multiple medications, who presents for re-evaluation of his gait instability.  He is unaccompanied today and requested a wheelchair in the lobby, has also requested help to get back into the car, drove himself to this appointment and did not bring his walker.  He is re-referred by his primary care nurse practitioner, Durenda Age; I reviewed her office note from 07/26/2020.  He was noted to have a shuffling gait.  Of note, he is on Depakote 1500 mg at bedtime, Remeron 7.5 mg at bedtime, prazosin 1 mg at bedtime, and Seroquel 150 mg at bedtime.  He is no longer on Zyprexa.  He had a head CT without contrast for indication of ataxia on 09/07/2018 and I reviewed the results: Impression: No acute intracranial abnormality.  He had recent blood work in May, as well as April.  I reviewed his BMP from 06/26/2020 which showed BUN of 38, elevated, creatinine elevated at 2.9, calcium low at 8.2, potassium low at 2.9.  Magnesium was low at 1.2.  He had additional blood work on 07/20/2020, hemoglobin was low at 11.1, hematocrit low at 36, iron 51, TIBC below normal at 189, ferritin elevated at 845.  I first met him as a referral from the emergency room on 02/09/2018, at which time he was referred for gait disturbance and weakness.  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  He was advised to get his medication regimen reevaluated by his psychiatrist at the [place].  We talked about potentially utilizing a DaTscan but I did not think it was safe to proceed as he had impaired kidney function.    He reports using a wheeled walker at home.  He fell yesterday in his living room but he was not using his walker at the time, denies any loss of consciousness or head injury, no other injuries, fell on carpet.  He reports that he has physical therapy twice weekly through home health and has a nurse come once a week.  He reports that he has had difficulty getting out of his car and about a month ago he needed help to get out of his car and incidentally a police patrol car was driving by and they assisted him. He does not hydrate well with water by self admission, does not actually drink any water, drinks Kool-Aid and tea.  He quit smoking several years ago and does not drink any alcohol.  He does not drink any other caffeine other than tea.  He estimates that he drinks about 3 cups of tea per day. He is divorced and lives alone.  Previously:   02/10/20: (He) presented to the emergency room on 02/07/2018. I reviewed the emergency room records. He had a CT head without contrast on 02/07/2018 and I reviewed the results: IMPRESSION: Mucosal thickening in the left maxillary sinus. No acute intracranial abnormalities.   He had a brain MRI without contrast on 02/07/2018 and I reviewed the test  results: IMPRESSION: 1.  No acute intracranial abnormality. 2. Largely unremarkable noncontrast MRI appearance of the brain, stable since 2011 aside from interval cerebral volume loss which appears appropriate for age.   He is on numerous medications including Procrit, Diflucan, Remicade, allopurinol, Norvasc, vitamin D, Depakote, when necessary loperamide, magnesium, meclizine as needed, Toprol-XL, Zyprexa, Prilosec, Detrol patient, prazosin, vitamin E. For his  mood disorder including bipolar diagnosis he is followed by the [place]. of note, he has been on Zyprexa for many years, likely over 10 years he estimates, he has been on Depakote a few years, he estimates less than 5 years. He has had some falls. He has been losing weight. He sees his primary care physician for this. He has had global weakness, has not used a walker but has thought about it. He lives alone, his son lives in [place], he has a sister in town who helps out. He has had cataracts and does not drive at night but does drive some. He does not like to drink water and admits that he drinks Kool-Aid, about 3-4 cups per day on average, no sodas but likes coffee about 3 cups per day. He denies any consistent tremors. He has no family history of Parkinson's disease. His Past Medical History Is Significant For: Past Medical History:  Diagnosis Date   Anemia    Anxiety    Benign paroxysmal positional vertigo    Bipolar I disorder, most recent episode (or current) unspecified    Celiac disease    CHF (congestive heart failure) (HCC)    Chronic kidney disease, stage III (moderate) (HCC)    Crohn's    Remicade q8 weeks   Depression    Essential and other specified forms of tremor    medication-induced Parkinson's, now resolved   Essential hypertension, benign    Gout 2018   Heart failure ([place])    Hypertrophy of prostate without urinary obstruction and other lower urinary tract symptoms (LUTS)    Impotence of organic origin    Insomnia with sleep apnea, unspecified    Iron deficiency anemia, unspecified    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   Neuralgia, neuritis, and radiculitis, unspecified    Other B-complex deficiencies    Other extrapyramidal disease and abnormal movement disorder    Postinflammatory pulmonary fibrosis ([place])    Tobacco use disorder    Vertigo 2018    His Past Surgical History Is Significant For: Past Surgical History:  Procedure Laterality Date   CATARACT EXTRACTION, BILATERAL  Bilateral 09/2018   CHOLECYSTECTOMY  07-12-2010   ICD IMPLANT N/A 01/02/2020   Procedure: ICD IMPLANT;  Surgeon: Evans Lance, MD;  Location: [facility];  Service: Cardiovascular;  Laterality: N/A;   RIGHT HEART CATH N/A 10/12/2018   Procedure: RIGHT HEART CATH;  Surgeon: Jolaine Artist, MD;  Location: [facility];  Service: Cardiovascular;  Laterality: N/A;   RIGHT/LEFT HEART CATH AND CORONARY ANGIOGRAPHY N/A 04/26/2019   Procedure: RIGHT/LEFT HEART CATH AND CORONARY ANGIOGRAPHY;  Surgeon: Larey Dresser, MD;  Location: [facility];  Service: Cardiovascular;  Laterality: N/A;   SMALL INTESTINE SURGERY     x 2    His Family History Is Significant For: Family History  Problem Relation Age of Onset   Diabetes Mother        maternal grandmother   Uterine cancer Mother    Emphysema Father    Pneumonia Maternal Grandmother    Colon cancer Neg Hx     His  Social History Is Significant For: Social History   Socioeconomic History   Marital status: Divorced    Spouse name: Not on file   Number of children: 1   Years of education: 12   Highest education level: Not on file  Occupational History   Occupation: retired   Occupation: Veteran  Tobacco Use   Smoking status: Former    Packs/day: 1.00    Years: 49.00    Pack years: 49.00    Types: Cigarettes    Start date: 04/11/1956    Quit date: 04/17/2014    Years since quitting: 6.3   Smokeless tobacco: Never  Vaping Use   Vaping Use: Never used  Substance and Sexual Activity   Alcohol use: No    Alcohol/week: 0.0 standard drinks   Drug use: No   Sexual activity: Never  Other Topics Concern   Not on file  Social History Narrative   Not on file   Social Determinants of Health   Financial Resource Strain: Not on file  Food Insecurity: Not on file  Transportation Needs: Not on file  Physical Activity: Not on file  Stress: Not on file  Social Connections: Not on file    His Allergies Are:   Allergies  Allergen Reactions   Azathioprine Other (See Comments)    REACTION: affected WBC "Almost died"   Ciprofloxacin Other (See Comments)    Reaction not recalled   Levaquin [Levofloxacin In D5w] Other (See Comments)    Reaction not rec   Plendil [Felodipine] Other (See Comments)    Reaction not not recalled  :   His Current Medications Are:  Outpatient Encounter Medications as of 08/13/2020  Medication Sig   acetaminophen (TYLENOL) 160 MG/5ML liquid Take 325 mg by mouth every 4 (four) hours as needed for fever.   allopurinol (ZYLOPRIM) 100 MG tablet Take 2 tablets (200 mg total) by mouth daily.   apixaban (ELIQUIS) 5 MG TABS tablet Take 1 tablet (5 mg total) by mouth 2 (two) times daily.   BUDESONIDE PO Take 9 mg by mouth daily.   Cholecalciferol (VITAMIN D) 125 MCG (5000 UT) CAPS Take 5,000 Units by mouth daily.    dapagliflozin propanediol (FARXIGA) 10 MG TABS tablet Take 1 tablet (10 mg total) by mouth daily before breakfast.   divalproex (DEPAKOTE ER) 500 MG 24 hr tablet Take 1,500 mg by mouth at bedtime.    Ensure (ENSURE) Take 1 Can by mouth 3 (three) times daily.   epoetin alfa (EPOGEN) 10000 UNIT/ML injection Inject 10,000 Units into the skin every 30 (thirty) days.   ferrous sulfate 325 (65 FE) MG tablet Take 325 mg by mouth in the morning and at bedtime.   furosemide (LASIX) 20 MG tablet Take 1 tablet (20 mg total) by mouth every Monday, Wednesday, and Friday.   hydrALAZINE (APRESOLINE) 25 MG tablet Take 25 mg by mouth 3 (three) times daily.   inFLIXimab (REMICADE) 100 MG injection Infuse Remicade IV schedule 1 8m/kg every 8 weeks Premedicate with Tylenol 500-6553mby mouth and Benadryl 25-5035my mouth prior to infusion. Last PPD was on 12/2009.    isosorbide mononitrate (IMDUR) 60 MG 24 hr tablet Take 1 tablet (60 mg total) by mouth daily.   ivabradine (CORLANOR) 5 MG TABS tablet TAKE 1 TABLET(5 MG) BY MOUTH TWICE DAILY WITH A MEAL   loperamide (IMODIUM) 2 MG  capsule Take by mouth 2 (two) times daily. Morning and Bedtime.   Magnesium 400 MG TABS Take 400 mg by  mouth daily.   metoprolol succinate (TOPROL-XL) 25 MG 24 hr tablet Take 1 tablet (25 mg total) by mouth in the morning and at bedtime.   mirtazapine (REMERON) 7.5 MG tablet TAKE 1 TABLET(7.5 MG) BY MOUTH AT BEDTIME   omeprazole (PRILOSEC) 20 MG capsule Take 20 mg by mouth daily.   QUEtiapine (SEROQUEL) 100 MG tablet Take 150 mg by mouth at bedtime.   solifenacin (VESICARE) 5 MG tablet Take 5 mg by mouth daily.   tamsulosin (FLOMAX) 0.4 MG CAPS capsule Take 1 capsule (0.4 mg total) by mouth daily.   vitamin B-12 (CYANOCOBALAMIN) 500 MCG tablet Take 1,000 mcg by mouth daily.    Calcium 500 MG tablet Take 1 tablet (500 mg total) by mouth 1 day or 1 dose for 1 dose.   No facility-administered encounter medications on file as of 08/13/2020.  :   Review of Systems:  Out of a complete 14 point review of systems, all are reviewed and negative with the exception of these symptoms as listed below:  Review of Systems  Neurological:        Here for consult on worsening gait. Reports decline in walking over the last few years, sts he shuffles and cannot ambulate well. Reports 1 fall over the last 6 months, denies any injuries.    Objective:  Neurological Exam  Physical Exam Physical Examination:   Vitals:   08/13/20 0914  BP: (!) 160/94  Pulse: 79  SpO2: 95%    General Examination: The patient is a very pleasant 75 y.o. male in no acute distress. He appears frail and deconditioned, in a clinic wheelchair.  He did not bring his walker today.   HEENT: Normocephalic, atraumatic, pupils are equal, round and reactive to light, status post bilateral cataract repairs.  Extraocular tracking is fairly well-preserved, minimal facial masking noted, mild nuchal rigidity, no carotid bruits.  Moderate airway crowding noted and moderate mouth dryness.  Dentures in place. Tongue protrudes centrally and  palate elevates symmetrically.   Chest: Clear to auscultation without wheezing, rhonchi or crackles noted.   Heart: S1+S2+0, regular and normal without murmurs, rubs or gallops noted.   Abdomen: Soft, non-tender and non-distended with normal bowel sounds appreciated on auscultation.   Extremities: There is trace pitting edema in the distal lower extremities bilaterally.   Skin: Warm and dry without trophic changes noted.   Musculoskeletal: exam reveals no obvious joint deformities.   Neurologically: Mental status: The patient is awake, alert and oriented in all 4 spheres. His immediate and remote memory, attention, language skills and fund of knowledge are fairly appropriate. Speech is clear with normal prosody and enunciation. Thought process is linear. Mood is normal and affect is normal. Cranial nerves II - XII are as described above under HEENT exam.  Motor exam: Thin bulk, global strength of 4 out of 5, no drift, no resting tremor, no postural or action tremor. Romberg is not testable due to safety concerns. Reflexes are 1+ in the upper extremities 1+ in the knees and absent in the ankles. Fine motor skills and coordination: Globally mildly impaired with regards to finger taps and rapid alternating patting, no lateralization, foot taps and foot agility are fairly good.  Cerebellar testing: No dysmetria or intention tremor. Sensory exam: intact to light touch. Gait, station and balance: He stands from wheelchair with mild difficulty and has to push himself up.  His posture is mild to moderately stooped for age.  He did not bring his walker and when he  tried to walk, he is walking very insecurely with small stutter steps.  He turns with difficulty and needs some guidance back to the wheelchair.    Assessment and Plan:    In summary, CARLO GUEVARRA is a 75 year old male with an underlying complex medical history of hypertension, tremor, Celiac disease, vertigo, congestive heart failure,  gout, insomnia, sleep apnea, smoking, chronic renal insufficiency, Crohn's disease, mood disorder, anemia, on multiple medications, who presents for re-evaluation of his gait instability, balance problem, incoordination and recurrent falls, as per primary care request.  I had evaluated him in 2019.  I explained to him that his gait disorder and balance issues are likely multifactorial in etiology.  I do not see any telltale signs of Parkinson's disease but he has some parkinsonian features, likely due to medication effect, he was on Zyprexa before and is currently on Seroquel. He has a nonspecific gait disorder, also mild global weakness, mild fine motor dyscontrol.  Contributing factors are likely dehydration or suboptimal hydration on a day-to-day basis, deconditioning, prior falls, and medication side effects.  Given these factors, I do not believe he is fully safe to drive.  I explained this to him.  He is advised to proceed with a repeat brain MRI for comparison with 2019.  We will have to do the MRI without contrast given his kidney impairment.  He is agreeable to this approach to see if there is any structural cause of his balance problem and recurrent falls.  He is advised to hydrate better with water, uses walker at all times for gait safety and continue with home health therapy.  He is advised to follow-up with his primary care routinely, we will call him with his brain MRI results and follow-up in this clinic accordingly.  I answered all his questions today and the patient was in agreement. I spent 35 minutes in total face-to-face time and in reviewing records during pre-charting, more than 50% of which was spent in counseling and coordination of care, reviewing test results, reviewing medications and treatment regimen and/or in discussing or reviewing the diagnosis of gait d/o, falls, the prognosis and treatment options. Pertinent laboratory and imaging test results that were available during this  visit with the patient were reviewed by me and considered in my medical decision making (see chart for details).    Addendum, 09/06/2020: Since he has an ICD in place and cannot have an MRI, I will change his order to a CT scan without contrast.

## 2020-08-13 NOTE — Telephone Encounter (Signed)
MRI brain wo contrast auth: NPR  Sent to GI for scheduling

## 2020-08-14 ENCOUNTER — Other Ambulatory Visit: Payer: Self-pay

## 2020-08-14 ENCOUNTER — Emergency Department (HOSPITAL_COMMUNITY): Payer: No Typology Code available for payment source

## 2020-08-14 ENCOUNTER — Emergency Department (HOSPITAL_COMMUNITY)
Admission: EM | Admit: 2020-08-14 | Discharge: 2020-08-14 | Disposition: A | Discharge status: Home / Self Care | Payer: No Typology Code available for payment source | Attending: Emergency Medicine | Admitting: Emergency Medicine

## 2020-08-14 DIAGNOSIS — R079 Chest pain, unspecified: Secondary | ICD-10-CM

## 2020-08-14 DIAGNOSIS — I130 Hypertensive heart and chronic kidney disease with heart failure and stage 1 through stage 4 chronic kidney disease, or unspecified chronic kidney disease: Secondary | ICD-10-CM | POA: Insufficient documentation

## 2020-08-14 DIAGNOSIS — Z87891 Personal history of nicotine dependence: Secondary | ICD-10-CM | POA: Diagnosis not present

## 2020-08-14 DIAGNOSIS — N183 Chronic kidney disease, stage 3 unspecified: Secondary | ICD-10-CM | POA: Diagnosis not present

## 2020-08-14 DIAGNOSIS — I5042 Chronic combined systolic (congestive) and diastolic (congestive) heart failure: Secondary | ICD-10-CM | POA: Insufficient documentation

## 2020-08-14 DIAGNOSIS — E876 Hypokalemia: Secondary | ICD-10-CM | POA: Diagnosis not present

## 2020-08-14 DIAGNOSIS — Z79899 Other long term (current) drug therapy: Secondary | ICD-10-CM | POA: Insufficient documentation

## 2020-08-14 DIAGNOSIS — R0902 Hypoxemia: Secondary | ICD-10-CM | POA: Diagnosis not present

## 2020-08-14 DIAGNOSIS — I129 Hypertensive chronic kidney disease with stage 1 through stage 4 chronic kidney disease, or unspecified chronic kidney disease: Secondary | ICD-10-CM | POA: Diagnosis not present

## 2020-08-14 DIAGNOSIS — Z7901 Long term (current) use of anticoagulants: Secondary | ICD-10-CM | POA: Diagnosis not present

## 2020-08-14 DIAGNOSIS — D631 Anemia in chronic kidney disease: Secondary | ICD-10-CM | POA: Diagnosis not present

## 2020-08-14 DIAGNOSIS — R0789 Other chest pain: Secondary | ICD-10-CM | POA: Diagnosis not present

## 2020-08-14 DIAGNOSIS — I5022 Chronic systolic (congestive) heart failure: Secondary | ICD-10-CM | POA: Diagnosis not present

## 2020-08-14 DIAGNOSIS — I10 Essential (primary) hypertension: Secondary | ICD-10-CM

## 2020-08-14 DIAGNOSIS — E1122 Type 2 diabetes mellitus with diabetic chronic kidney disease: Secondary | ICD-10-CM | POA: Diagnosis not present

## 2020-08-14 DIAGNOSIS — Z743 Need for continuous supervision: Secondary | ICD-10-CM | POA: Diagnosis not present

## 2020-08-14 DIAGNOSIS — R6889 Other general symptoms and signs: Secondary | ICD-10-CM | POA: Diagnosis not present

## 2020-08-14 DIAGNOSIS — N184 Chronic kidney disease, stage 4 (severe): Secondary | ICD-10-CM | POA: Diagnosis not present

## 2020-08-14 LAB — COMPREHENSIVE METABOLIC PANEL
ALT: 8 U/L (ref 0–44)
AST: 9 U/L — ABNORMAL LOW (ref 15–41)
Albumin: 2.5 g/dL — ABNORMAL LOW (ref 3.5–5.0)
Alkaline Phosphatase: 57 U/L (ref 38–126)
Anion gap: 9 (ref 5–15)
BUN: 22 mg/dL (ref 8–23)
CO2: 25 mmol/L (ref 22–32)
Calcium: 8.3 mg/dL — ABNORMAL LOW (ref 8.9–10.3)
Chloride: 106 mmol/L (ref 98–111)
Creatinine, Ser: 1.93 mg/dL — ABNORMAL HIGH (ref 0.61–1.24)
GFR, Estimated: 36 mL/min — ABNORMAL LOW (ref >60)
Glucose, Bld: 83 mg/dL (ref 70–99)
Potassium: 3.9 mmol/L (ref 3.5–5.1)
Sodium: 140 mmol/L (ref 135–145)
Total Bilirubin: 0.5 mg/dL (ref 0.3–1.2)
Total Protein: 6.1 g/dL — ABNORMAL LOW (ref 6.5–8.1)

## 2020-08-14 LAB — CBC WITH DIFFERENTIAL/PLATELET
Abs Immature Granulocytes: 0.05 10*3/uL (ref 0.00–0.07)
Basophils Absolute: 0 10*3/uL (ref 0.0–0.1)
Basophils Relative: 0 %
Eosinophils Absolute: 0.2 10*3/uL (ref 0.0–0.5)
Eosinophils Relative: 2 %
HCT: 36 % — ABNORMAL LOW (ref 39.0–52.0)
Hemoglobin: 11.4 g/dL — ABNORMAL LOW (ref 13.0–17.0)
Immature Granulocytes: 1 %
Lymphocytes Relative: 31 %
Lymphs Abs: 3.1 10*3/uL (ref 0.7–4.0)
MCH: 34.1 pg — ABNORMAL HIGH (ref 26.0–34.0)
MCHC: 31.7 g/dL (ref 30.0–36.0)
MCV: 107.8 fL — ABNORMAL HIGH (ref 80.0–100.0)
Monocytes Absolute: 0.8 10*3/uL (ref 0.1–1.0)
Monocytes Relative: 8 %
Neutro Abs: 5.9 10*3/uL (ref 1.7–7.7)
Neutrophils Relative %: 58 %
Platelets: 108 10*3/uL — ABNORMAL LOW (ref 150–400)
RBC: 3.34 MIL/uL — ABNORMAL LOW (ref 4.22–5.81)
RDW: 13.3 % (ref 11.5–15.5)
WBC: 10.1 10*3/uL (ref 4.0–10.5)
nRBC: 0 % (ref 0.0–0.2)

## 2020-08-14 LAB — URINALYSIS, ROUTINE W REFLEX MICROSCOPIC
Bilirubin Urine: NEGATIVE
Glucose, UA: >=500 mg/dL — AB
Hgb urine dipstick: NEGATIVE
Ketones, ur: NEGATIVE mg/dL
Leukocytes,Ua: NEGATIVE
Nitrite: NEGATIVE
Protein, ur: NEGATIVE mg/dL
Specific Gravity, Urine: 1.009 (ref 1.005–1.030)
pH: 6 (ref 5.0–8.0)

## 2020-08-14 LAB — TROPONIN I (HIGH SENSITIVITY)
Troponin I (High Sensitivity): 18 ng/L — ABNORMAL HIGH (ref <18)
Troponin I (High Sensitivity): 15 ng/L (ref <18)

## 2020-08-14 NOTE — Discharge Instructions (Addendum)
Your testing today reveals that you are not having a heart attack.  The interrogation of your defibrillator shows that you have not had it fire and approximately 4 months.  This is great news  Please see your family doctor for further evaluation of your pain if it continues.  With your blood pressure being over 130, remember that your doctor wants you to take the hydralazine - one tablet 3 times a day - if it is that high - you should take this when you get home.  Thank you for letting us take care of you today!  Please obtain all of your results from medical records or have your doctors office obtain the results - share them with your doctor - you should be seen at your doctors office in the next 2 days. Call today to arrange your follow up. Take the medications as prescribed. Please review all of the medicines and only take them if you do not have an allergy to them. Please be aware that if you are taking birth control pills, taking other prescriptions, ESPECIALLY ANTIBIOTICS may make the birth control ineffective - if this is the case, either do not engage in sexual activity or use alternative methods of birth control such as condoms until you have finished the medicine and your family doctor says it is OK to restart them. If you are on a blood thinner such as COUMADIN, be aware that any other medicine that you take may cause the coumadin to either work too much, or not enough - you should have your coumadin level rechecked in next 7 days if this is the case.  ?  It is also a possibility that you have an allergic reaction to any of the medicines that you have been prescribed - Everybody reacts differently to medications and while MOST people have no trouble with most medicines, you may have a reaction such as nausea, vomiting, rash, swelling, shortness of breath. If this is the case, please stop taking the medicine immediately and contact your physician.   If you were given a medication in the ED such  as percocet, vicodin, or morphine, be aware that these medicines are sedating and may change your ability to take care of yourself adequately for several hours after being given this medicines - you should not drive or take care of small children if you were given this medicine in the Emergency Department or if you have been prescribed these types of medicines. ?   You should return to the ER IMMEDIATELY if you develop severe or worsening symptoms.

## 2020-08-14 NOTE — ED Provider Notes (Signed)
[facility] EMERGENCY DEPARTMENT Provider Note   CSN: [id number] Arrival date & time: 08/14/20  1009     History Chief Complaint  Patient presents with   Chest Pain    Brent [name] is a 75 y.o. male.   Chest Pain  This patient is a very pleasant 75 year old male who has a history of congestive heart failure with ejection fraction of 25% as of September 2021.  He has a history of a nonischemic cardiomyopathy and had a left heart catheterization in March 2021 confirming this.  He presents to the hospital today because of a aching in his chest, he states he has had this now for at least 1 month and thought it was time to come get it checked out.  He thought that maybe this was because his defibrillator was firing at night making him feel sore in the morning.  He denies exertional symptoms, he has occasional shortness of breath with this he is not nauseated vomiting, denies swelling of his legs or diaphoresis.  At this time the patient feels a minimal discomfort in the mid to left side of his chest  Past Medical History:  Diagnosis Date   Anemia    Anxiety    Benign paroxysmal positional vertigo    Bipolar I disorder, most recent episode (or current) unspecified    Celiac disease    CHF (congestive heart failure) (HCC)    Chronic kidney disease, stage III (moderate) (HCC)    Crohn's    Remicade q8 weeks   Depression    Essential and other specified forms of tremor    medication-induced Parkinson's, now resolved   Essential hypertension, benign    Gout 2018   Heart failure ([place])    Hypertrophy of prostate without urinary obstruction and other lower urinary tract symptoms (LUTS)    Impotence of organic origin    Insomnia with sleep apnea, unspecified    Iron deficiency anemia, unspecified    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   Neuralgia, neuritis, and radiculitis, unspecified    Other B-complex deficiencies    Other extrapyramidal disease and abnormal movement disorder     Postinflammatory pulmonary fibrosis ([place])    Tobacco use disorder    Vertigo 2018    Patient Active Problem List   Diagnosis Date Noted   Electrolyte abnormality 05/17/2020   ICD (implantable cardioverter-defibrillator) in place 28/41/3244   Chronic systolic heart failure ([place]) 01/02/2020   Decreased appetite 10/08/2019   Weakness 10/08/2019   Hyperkalemia 02/26/7251   Chronic systolic CHF (congestive heart failure) ([place]) 08/26/2019   Type 2 diabetes mellitus with stage 3 chronic kidney disease ([place]) 08/26/2019   Chronic obstructive pulmonary disease ([place]) 06/20/2019   Ventricular tachycardia ([place]) 06/20/2019   Acute on chronic systolic (congestive) heart failure ([place]) 66/44/0347   Metabolic acidosis 42/59/5638   Orthostasis 09/07/2018   AKI (acute kidney injury) ([place])    Immunosuppressed status (HCC)    Macrocytic anemia    Chronic combined systolic and diastolic congestive heart failure ([place]) 06/03/2018   Candida esophagitis ([place]) 09/22/2017   History of smoking 30 or more pack years 01/05/2017   Bipolar 1 disorder ([place])    BPH (benign prostatic hyperplasia) 03/31/2013   Anemia in chronic kidney disease 03/31/2013   Essential hypertension, benign 03/31/2013   Acute renal failure superimposed on stage 3 chronic kidney disease ([place]) 06/04/2012   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/23/2010    Past Surgical History:  Procedure Laterality Date   CATARACT EXTRACTION, BILATERAL Bilateral  09/2018   CHOLECYSTECTOMY  07-12-2010   ICD IMPLANT N/A 01/02/2020   Procedure: ICD IMPLANT;  Surgeon: Evans Lance, MD;  Location: [place] [facility];  Service: Cardiovascular;  Laterality: N/A;   RIGHT HEART CATH N/A 10/12/2018   Procedure: RIGHT HEART CATH;  Surgeon: Jolaine Artist, MD;  Location: [facility];  Service: Cardiovascular;  Laterality: N/A;   RIGHT/LEFT HEART CATH AND CORONARY ANGIOGRAPHY N/A 04/26/2019   Procedure: RIGHT/LEFT HEART CATH AND CORONARY ANGIOGRAPHY;   Surgeon: Larey Dresser, MD;  Location: [place] [facility];  Service: Cardiovascular;  Laterality: N/A;   SMALL INTESTINE SURGERY     x 2       Family History  Problem Relation Age of Onset   Diabetes Mother        maternal grandmother   Uterine cancer Mother    Emphysema Father    Pneumonia Maternal Grandmother    Colon cancer Neg Hx     Social History   Tobacco Use   Smoking status: Former    Packs/day: 1.00    Years: 49.00    Pack years: 49.00    Types: Cigarettes    Start date: 04/11/1956    Quit date: 04/17/2014    Years since quitting: 6.3   Smokeless tobacco: Never  Vaping Use   Vaping Use: Never used  Substance Use Topics   Alcohol use: No    Alcohol/week: 0.0 standard drinks   Drug use: No    Home Medications Prior to Admission medications   Medication Sig Start Date End Date Taking? Authorizing Provider  acetaminophen (TYLENOL) 160 MG/5ML liquid Take 325 mg by mouth every 4 (four) hours as needed for fever.    [provider]  allopurinol (ZYLOPRIM) 100 MG tablet Take 2 tablets (200 mg total) by mouth daily. 08/07/20   Wardell Honour, MD  apixaban (ELIQUIS) 5 MG TABS tablet Take 1 tablet (5 mg total) by mouth 2 (two) times daily. 05/25/20 08/23/20  Larey Dresser, MD  BUDESONIDE PO Take 9 mg by mouth daily.    [provider]  Calcium 500 MG tablet Take 1 tablet (500 mg total) by mouth 1 day or 1 dose for 1 dose. 05/19/20 05/20/20  Geradine Girt, DO  Cholecalciferol (VITAMIN D) 125 MCG (5000 UT) CAPS Take 5,000 Units by mouth daily.     [provider]  dapagliflozin propanediol (FARXIGA) 10 MG TABS tablet Take 1 tablet (10 mg total) by mouth daily before breakfast. 05/25/20   Larey Dresser, MD  divalproex (DEPAKOTE ER) 500 MG 24 hr tablet Take 1,500 mg by mouth at bedtime.     [provider]  epoetin alfa (EPOGEN) 10000 UNIT/ML injection Inject 10,000 Units into the skin every 30 (thirty) days.    [provider]  ferrous sulfate 325 (65 FE) MG tablet Take 325 mg by mouth in the morning and at bedtime.    [provider]  furosemide (LASIX) 20 MG tablet Take 1 tablet (20 mg total) by mouth every Monday, Wednesday, and Friday. 06/20/20   Bensimhon, Shaune Pascal, MD  hydrALAZINE (APRESOLINE) 25 MG tablet Take 25 mg by mouth 3 (three) times daily.    [provider]  inFLIXimab (REMICADE) 100 MG injection Infuse Remicade IV schedule 1 40m/kg every 8 weeks Premedicate with Tylenol 500-6560mby mouth and Benadryl 25-5054my mouth prior to infusion. Last PPD was on 12/2009.  11/22/10   JacMilus BanisterD  isosorbide mononitrate (IMDUR) 60 MG 24 hr tablet Take 1 tablet (60 mg total) by mouth daily. 03/28/20   Bensimhon, Shaune Pascal, MD  ivabradine (CORLANOR) 5 MG TABS tablet TAKE 1 TABLET(5 MG) BY MOUTH TWICE DAILY WITH A MEAL Patient taking differently: Take 5 mg by mouth 2 (two) times daily with a meal. 05/25/20   Larey Dresser, MD  loperamide (IMODIUM) 2 MG capsule Take by mouth 2 (two) times daily. Morning and Bedtime.    [provider]  Magnesium 400 MG TABS Take 400 mg by mouth daily. 05/19/20   Geradine Girt, DO  metoprolol succinate (TOPROL-XL) 25 MG 24 hr tablet Take 1 tablet (25 mg total) by mouth in the morning and at bedtime. 03/28/20   Bensimhon, Shaune Pascal, MD  mirtazapine (REMERON) 7.5 MG tablet TAKE 1 TABLET(7.5 MG) BY MOUTH AT BEDTIME Patient taking differently: Take 7.5 mg by mouth at bedtime. 03/28/20   [name], Dinah C, NP  omeprazole (PRILOSEC) 20 MG capsule Take 20 mg by mouth daily.    [provider]  QUEtiapine (SEROQUEL) 100 MG tablet Take 150 mg by mouth at bedtime. 01/25/20   [provider]  solifenacin (VESICARE) 5 MG tablet Take 5 mg by mouth daily.    [provider]  tamsulosin (FLOMAX) 0.4 MG CAPS capsule Take 1 capsule (0.4 mg total) by mouth daily. 06/16/19   Reed, Tiffany L, DO  vitamin B-12 (CYANOCOBALAMIN) 500 MCG tablet Take 1,000  mcg by mouth daily.     [provider]    Allergies    Azathioprine, Ciprofloxacin, Levaquin [levofloxacin in d5w], and Plendil [felodipine]  Review of Systems   Review of Systems  Cardiovascular:  Positive for chest pain.  All other systems reviewed and are negative.  Physical Exam Updated Vital Signs BP (!) 181/99   Pulse 69   Temp 98.3 F (36.8 C) (Oral)   Resp 17   Ht 1.93 m (6' 4" )   Wt 77.6 kg   SpO2 (!) 89%   BMI 20.81 kg/m   Physical Exam Vitals and nursing note reviewed.  Constitutional:      General: He is not in acute distress.    Appearance: He is well-developed.  HENT:     Head: Normocephalic and atraumatic.     Mouth/Throat:     Pharynx: No oropharyngeal exudate.  Eyes:     General: No scleral icterus.       Right eye: No discharge.        Left eye: No discharge.     Conjunctiva/sclera: Conjunctivae normal.     Pupils: Pupils are equal, round, and reactive to light.  Neck:     Thyroid: No thyromegaly.     Vascular: No JVD.  Cardiovascular:     Rate and Rhythm: Normal rate and regular rhythm.     Heart sounds: Normal heart sounds. No murmur heard.   No friction rub. No gallop.  Pulmonary:     Effort: Pulmonary effort is normal. No respiratory distress.     Breath sounds: Normal breath sounds. No wheezing or rales.  Abdominal:     General: Bowel sounds are normal. There is no distension.     Palpations: Abdomen is soft. There is no mass.     Tenderness: There is no abdominal tenderness.  Musculoskeletal:        General: No tenderness. Normal range of motion.     Cervical back: Normal range of motion and neck supple.  Lymphadenopathy:  Cervical: No cervical adenopathy.  Skin:    General: Skin is warm and dry.     Findings: No erythema or rash.  Neurological:     Mental Status: He is alert.     Coordination: Coordination normal.     Comments: At baseline  Psychiatric:        Behavior: Behavior normal.    ED Results /  Procedures / Treatments   Labs (all labs ordered are listed, but only abnormal results are displayed) Labs Reviewed  COMPREHENSIVE METABOLIC PANEL - Abnormal; Notable for the following components:      Result Value   Creatinine, Ser 1.93 (*)    Calcium 8.3 (*)    Total Protein 6.1 (*)    Albumin 2.5 (*)    AST 9 (*)    GFR, Estimated 36 (*)    All other components within normal limits  CBC WITH DIFFERENTIAL/PLATELET - Abnormal; Notable for the following components:   RBC 3.34 (*)    Hemoglobin 11.4 (*)    HCT 36.0 (*)    MCV 107.8 (*)    MCH 34.1 (*)    Platelets 108 (*)    All other components within normal limits  URINALYSIS, ROUTINE W REFLEX MICROSCOPIC - Abnormal; Notable for the following components:   Glucose, UA >=500 (*)    Bacteria, UA RARE (*)    All other components within normal limits  TROPONIN I (HIGH SENSITIVITY) - Abnormal; Notable for the following components:   Troponin I (High Sensitivity) 18 (*)    All other components within normal limits  TROPONIN I (HIGH SENSITIVITY)    EKG EKG Interpretation  Date/Time:  Tuesday August 14 2020 10:11:03 EDT Ventricular Rate:  81 PR Interval:  140 QRS Duration: 90 QT Interval:  376 QTC Calculation: 436 R Axis:   -25 Text Interpretation: Normal sinus rhythm Minimal voltage criteria for LVH, may be normal variant ( R in aVL ) Possible Anterior infarct , age undetermined Abnormal ECG since last tracing no significant change Confirmed by Noemi Chapel [phone]) on 08/14/2020 11:07:23 AM  Radiology DG Chest 2 View  Result Date: 08/14/2020 CLINICAL DATA:  Chest pain EXAM: CHEST - 2 VIEW COMPARISON:  01/03/2020 FINDINGS: Single lead left-sided implanted cardiac device, stable in positioning. The heart size and mediastinal contours are within normal limits. New subtle reticulonodular opacity within the right upper lobe. Lungs are otherwise clear. No pleural effusion or pneumothorax. The visualized skeletal structures are  unremarkable. IMPRESSION: New subtle reticulonodular opacity within the right upper lobe, suspicious for infectious/inflammatory bronchiolitis. Radiographic follow-up to resolution is recommended. Electronically Signed   By: Davina Poke D.O.   On: 08/14/2020 10:59    Procedures Procedures   Medications Ordered in ED Medications - No data to display  ED Course  I have reviewed the triage vital signs and the nursing notes.  Pertinent labs & imaging results that were available during my care of the patient were reviewed by me and considered in my medical decision making (see chart for details).  Clinical Course as of 08/14/20 1349  Tue Aug 14, 2020  1345 I have reviewed the patients results including the repeat troponin - it is downtrending and at a very low rate - this is consistent with having CHF and not ischemia - he has no distress at thsi time, he has a normal urinalysis other than mild glucosuria, his metabolic panel shows no signs of significant electrolyte dysfunction and his creatinine is at baseline.  His white  blood cell count is 10,000 hemoglobin is 11.4.  I obtained his evaluation from the [employer] evaluation service.  They report that there has been no episodes of ventricular tachycardia or ventricular fibrillation in several months.  This is reassuring and at this time I feel the patient is stable for discharge.  He is a little hypertensive but is afebrile and has a normal heart rate.  He is agreeable [BM]    Clinical Course User Index [BM] Noemi Chapel, MD   MDM Rules/Calculators/A&P                          The patient's x-ray shows some possible reticulonodular opacity in the right upper lobe, the patient is not having coughing or shortness of breath per se.  It is more left-sided chest discomfort.  His EKG is also unremarkable showing that there is no acute findings just left ventricular hypertrophy and his labs thus far are unremarkable.  We will interrogate the  pacemaker defibrillator to make sure there has been no abnormal activity.  Checking electrolytes to make sure he is not hypokalemic.    Final Clinical Impression(s) / ED Diagnoses Final diagnoses:  Hypertension, unspecified type  Chest pain, unspecified type    Rx / DC Orders ED Discharge Orders     None        Noemi Chapel, MD 08/14/20 1349

## 2020-08-14 NOTE — ED Triage Notes (Signed)
Pt via EMS from home, reports intermittent chest pain x 1 month. Sts pain was the worst it's ever been this morning; however on EMS arrival pain was gone. Pt sts he thinks his ICD shocked him last night.

## 2020-08-14 NOTE — ED Provider Notes (Signed)
Emergency Medicine Provider Triage Evaluation Note  Brent [name] 75 y.o. male was evaluated in triage.  Pt complains of intermittent chest pain that has been ongoing for months.  He states that he woke up this morning with chest pain and states that it felt worse than normal.  He states it is midsternal and feels like an ache pain.  It does not radiate.  No associated diaphoresis, nausea/vomiting.  He states occasionally will have some shortness of breath with it but states it does not last.  He states that the pain currently is resolved.  By the time EMS got there this morning, he was chest pain-free but since that this was the worst episode he has had, he felt like he needed to get evaluated.  He has a history of ICD and he states that maybe it shocked him last night.  He is unsure if it was real or if he was dreaming.  He denies any fevers, cough, chills.  Denies any abdominal pain, nausea/vomiting.   Review of Systems  Positive: Chest pain Negative: Abdominal pain, nausea/vomiting, fever/chills.  Physical Exam  BP 134/82   Pulse 70   Temp 98.2 F (36.8 C) (Oral)   Resp 18   Ht 5' 4"  (1.626 m)   Wt 65.8 kg   SpO2 100%   BMI 24.89 kg/m  Gen:   Awake, no distress.  Well-appearing HEENT:  Atraumatic  Resp:  Normal effort  Cardiac:  Normal rate.  2+ radial pulses. Abd:   Nondistended, nontender MSK:   Moves extremities without difficulty  Neuro:  Speech clear   Other:      Medical Decision Making  Medically screening exam initiated at 10:14 AM  Appropriate orders placed.  Brent [name] was informed that the remainder of the evaluation will be completed by another provider, this initial triage assessment does not replace that evaluation. They are counseled that they will need to remain in the ED until the completion of their workup, including full H&P and results of any tests.  Risks of leaving the emergency department prior to completion of treatment were discussed. Patient was advised  to inform ED staff if they are leaving before their treatment is complete. The patient acknowledged these risks and time was allowed for questions.     The patient appears stable so that the remainder of the MSE may be completed by another provider.    Clinical Impression  CP   Portions of this note were generated with Dragon dictation software. Dictation errors may occur despite best attempts at proofreading.     Volanda Napoleon, PA-C 08/14/20 1015    Noemi Chapel, MD 08/14/20 [phone]

## 2020-08-15 ENCOUNTER — Other Ambulatory Visit (HOSPITAL_COMMUNITY): Payer: Self-pay

## 2020-08-15 NOTE — Progress Notes (Signed)
Paramedicine Encounter    Patient ID: Brent [name], male    DOB: 09-19-1945, 75 y.o.   MRN: [id number]   Patient Care Team: Wardell Honour, MD as PCP - General (Family Medicine) [place], Peter M, MD as PCP - Cardiology (Cardiology) Larey Dresser, MD as PCP - Advanced Heart Failure (Cardiology) Elmarie Shiley, MD (Nephrology) Milus Banister, MD (Gastroenterology) Jorge Ny, LCSW as Social Worker (Licensed Clinical Social Worker)  Patient Active Problem List   Diagnosis Date Noted   Electrolyte abnormality 05/17/2020   ICD (implantable cardioverter-defibrillator) in place 63/84/6659   Chronic systolic heart failure ([place]) 01/02/2020   Decreased appetite 10/08/2019   Weakness 10/08/2019   Hyperkalemia 93/57/0177   Chronic systolic CHF (congestive heart failure) ([place]) 08/26/2019   Type 2 diabetes mellitus with stage 3 chronic kidney disease ([place]) 08/26/2019   Chronic obstructive pulmonary disease ([place]) 06/20/2019   Ventricular tachycardia ([place]) 06/20/2019   Acute on chronic systolic (congestive) heart failure ([place]) 93/90/3009   Metabolic acidosis 23/30/0762   Orthostasis 09/07/2018   AKI (acute kidney injury) ([place])    Immunosuppressed status ([place])    Macrocytic anemia    Chronic combined systolic and diastolic congestive heart failure ([place]) 06/03/2018   Candida esophagitis ([place]) 09/22/2017   History of smoking 30 or more pack years 01/05/2017   Bipolar 1 disorder ([place])    BPH (benign prostatic hyperplasia) 03/31/2013   Anemia in chronic kidney disease 03/31/2013   Essential hypertension, benign 03/31/2013   Acute renal failure superimposed on stage 3 chronic kidney disease ([place]) 06/04/2012   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[place]) 01/23/2010    Current Outpatient Medications:    acetaminophen (TYLENOL) 160 MG/5ML liquid, Take 325 mg by mouth every 4 (four) hours as needed for fever., Disp: , Rfl:    allopurinol (ZYLOPRIM) 100 MG tablet, Take 2 tablets (200 mg total) by mouth  daily., Disp: 60 tablet, Rfl: 5   apixaban (ELIQUIS) 5 MG TABS tablet, Take 1 tablet (5 mg total) by mouth 2 (two) times daily., Disp: 180 tablet, Rfl: 3   BUDESONIDE PO, Take 9 mg by mouth daily., Disp: , Rfl:    Calcium 500 MG tablet, Take 1 tablet (500 mg total) by mouth 1 day or 1 dose for 1 dose., Disp: 30 tablet, Rfl:    Cholecalciferol (VITAMIN D) 125 MCG (5000 UT) CAPS, Take 5,000 Units by mouth daily. , Disp: , Rfl:    dapagliflozin propanediol (FARXIGA) 10 MG TABS tablet, Take 1 tablet (10 mg total) by mouth daily before breakfast., Disp: 90 tablet, Rfl: 3   divalproex (DEPAKOTE ER) 500 MG 24 hr tablet, Take 1,500 mg by mouth at bedtime. , Disp: , Rfl:    epoetin alfa (EPOGEN) 10000 UNIT/ML injection, Inject 10,000 Units into the skin every 30 (thirty) days., Disp: , Rfl:    ferrous sulfate 325 (65 FE) MG tablet, Take 325 mg by mouth in the morning and at bedtime., Disp: , Rfl:    furosemide (LASIX) 20 MG tablet, Take 1 tablet (20 mg total) by mouth every Monday, Wednesday, and Friday., Disp: 39 tablet, Rfl: 3   hydrALAZINE (APRESOLINE) 25 MG tablet, Take 25 mg by mouth 3 (three) times daily., Disp: , Rfl:    inFLIXimab (REMICADE) 100 MG injection, Infuse Remicade IV schedule 1 34m/kg every 8 weeks Premedicate with Tylenol 500-6537mby mouth and Benadryl 25-5057my mouth prior to infusion. Last PPD was on 12/2009. , Disp: 1 each, Rfl: 6   isosorbide mononitrate (IMDUR)  60 MG 24 hr tablet, Take 1 tablet (60 mg total) by mouth daily., Disp: 90 tablet, Rfl: 3   ivabradine (CORLANOR) 5 MG TABS tablet, TAKE 1 TABLET(5 MG) BY MOUTH TWICE DAILY WITH A MEAL (Patient taking differently: Take 5 mg by mouth 2 (two) times daily with a meal.), Disp: 180 tablet, Rfl: 3   loperamide (IMODIUM) 2 MG capsule, Take by mouth 2 (two) times daily. Morning and Bedtime., Disp: , Rfl:    Magnesium 400 MG TABS, Take 400 mg by mouth daily., Disp: 30 tablet, Rfl: 1   metoprolol succinate (TOPROL-XL) 25 MG 24 hr  tablet, Take 1 tablet (25 mg total) by mouth in the morning and at bedtime., Disp: 180 tablet, Rfl: 6   mirtazapine (REMERON) 7.5 MG tablet, TAKE 1 TABLET(7.5 MG) BY MOUTH AT BEDTIME (Patient taking differently: Take 7.5 mg by mouth at bedtime.), Disp: 30 tablet, Rfl: 3   omeprazole (PRILOSEC) 20 MG capsule, Take 20 mg by mouth daily., Disp: , Rfl:    QUEtiapine (SEROQUEL) 100 MG tablet, Take 150 mg by mouth at bedtime., Disp: , Rfl:    solifenacin (VESICARE) 5 MG tablet, Take 5 mg by mouth daily., Disp: , Rfl:    tamsulosin (FLOMAX) 0.4 MG CAPS capsule, Take 1 capsule (0.4 mg total) by mouth daily., Disp: 30 capsule, Rfl: 2   vitamin B-12 (CYANOCOBALAMIN) 500 MCG tablet, Take 1,000 mcg by mouth daily. , Disp: , Rfl:  Allergies  Allergen Reactions   Azathioprine Other (See Comments)    REACTION: affected WBC "Almost died"   Ciprofloxacin Other (See Comments)    Reaction not recalled   Levaquin [Levofloxacin In D5w] Other (See Comments)    Reaction not rec   Plendil [Felodipine] Other (See Comments)    Reaction not not recalled     Social History   Socioeconomic History   Marital status: Divorced    Spouse name: Not on file   Number of children: 1   Years of education: 12   Highest education level: Not on file  Occupational History   Occupation: retired   Occupation: Veteran  Tobacco Use   Smoking status: Former    Packs/day: 1.00    Years: 49.00    Pack years: 49.00    Types: Cigarettes    Start date: 04/11/1956    Quit date: 04/17/2014    Years since quitting: 6.3   Smokeless tobacco: Never  Vaping Use   Vaping Use: Never used  Substance and Sexual Activity   Alcohol use: No    Alcohol/week: 0.0 standard drinks   Drug use: No   Sexual activity: Never  Other Topics Concern   Not on file  Social History Narrative   Not on file   Social Determinants of Health   Financial Resource Strain: Not on file  Food Insecurity: Not on file  Transportation Needs: Not on file   Physical Activity: Not on file  Stress: Not on file  Social Connections: Not on file  Intimate Partner Violence: Not on file    Physical Exam Vitals reviewed.  Constitutional:      Appearance: Normal appearance. He is normal weight.  HENT:     Head: Normocephalic.     Nose: Nose normal.     Mouth/Throat:     Mouth: Mucous membranes are moist.     Pharynx: Oropharynx is clear.  Eyes:     Pupils: Pupils are equal, round, and reactive to light.  Cardiovascular:     Rate  and Rhythm: Normal rate and regular rhythm.     Pulses: Normal pulses.     Heart sounds: Normal heart sounds.  Pulmonary:     Effort: Pulmonary effort is normal. No respiratory distress.     Breath sounds: Normal breath sounds. No stridor. No wheezing, rhonchi or rales.  Abdominal:     General: Abdomen is flat.     Palpations: Abdomen is soft.  Musculoskeletal:        General: No swelling. Normal range of motion.     Cervical back: Normal range of motion.     Right lower leg: No edema.     Left lower leg: No edema.     Comments: Shuffling while walkng, not taking actual steps.  Skin:    General: Skin is warm.     Capillary Refill: Capillary refill takes less than 2 seconds.  Neurological:     General: No focal deficit present.     Mental Status: He is alert. Mental status is at baseline.  Psychiatric:        Mood and Affect: Mood normal.    Arrived for home visit for Jessy who was alert and oriented reporting he is feeling good today. Samuel states he was seen in ER yesterday for chest pain and possible firing of his ICD. They interrogated same and had no findings of arrhythmia of firing.Findings were normal with no diagnoses. Delmont was discharged home. He stated he felt as if they did not treat him well in the ER, I apologized to him for the care he felt he received. Casmer denied chest pain,dizziness, shortness of breath, swelling or trouble sleeping today. I obtained vitals and assessment as noted. I  reviewed medications and filled pill box accordingly. I reminded Jakaden of his upcoming appointments and he agreed. I will see Cordon next week. Home visit complete.   Refills: Remeron, Loperamide, Eliquis       Future Appointments  Date Time Provider [place]  08/20/2020 10:00 AM [facility] RM 2 [facility] None  08/24/2020 11:30 AM [facility] PA/NP [facility] None  10/02/2020  7:00 AM [facility] DEVICE REMOTES [facility]  10/30/2020  1:00 PM Wardell Honour, MD [facility] None  01/01/2021  7:00 AM [facility] DEVICE REMOTES [facility]  01/29/2021  1:00 PM [name], Dinah C, NP [facility] None  04/02/2021  7:00 AM [facility] DEVICE REMOTES [facility]  07/02/2021  7:00 AM [facility] DEVICE REMOTES [facility]     ACTION: Home visit completed Next visit planned for one week

## 2020-08-17 ENCOUNTER — Other Ambulatory Visit: Payer: Self-pay | Admitting: Family

## 2020-08-17 DIAGNOSIS — F331 Major depressive disorder, recurrent, moderate: Secondary | ICD-10-CM

## 2020-08-17 DIAGNOSIS — R630 Anorexia: Secondary | ICD-10-CM

## 2020-08-20 ENCOUNTER — Encounter (HOSPITAL_COMMUNITY): Payer: Self-pay

## 2020-08-20 ENCOUNTER — Non-Acute Institutional Stay (HOSPITAL_COMMUNITY)
Admission: RE | Admit: 2020-08-20 | Discharge: 2020-08-20 | Disposition: A | Discharge status: Home / Self Care | Payer: No Typology Code available for payment source | Source: Ambulatory Visit | Attending: Internal Medicine | Admitting: Internal Medicine

## 2020-08-20 ENCOUNTER — Other Ambulatory Visit: Payer: Self-pay

## 2020-08-23 ENCOUNTER — Telehealth (HOSPITAL_COMMUNITY): Payer: Self-pay

## 2020-08-23 NOTE — Telephone Encounter (Signed)
Spoke to [name] who agreed to appointment in the morning at 1130. I will meet him there. Call complete.

## 2020-08-24 ENCOUNTER — Ambulatory Visit (HOSPITAL_COMMUNITY)
Admission: RE | Admit: 2020-08-24 | Discharge: 2020-08-24 | Disposition: A | Discharge status: Home / Self Care | Payer: Medicare Other | Source: Ambulatory Visit | Attending: Family Medicine | Admitting: Family Medicine

## 2020-08-24 ENCOUNTER — Other Ambulatory Visit: Payer: Self-pay

## 2020-08-24 ENCOUNTER — Encounter (HOSPITAL_COMMUNITY): Payer: Self-pay

## 2020-08-24 ENCOUNTER — Other Ambulatory Visit (HOSPITAL_COMMUNITY): Payer: Self-pay

## 2020-08-24 VITALS — BP 146/84 | HR 113 | Ht 76.0 in | Wt 168.0 lb

## 2020-08-24 DIAGNOSIS — Z95810 Presence of automatic (implantable) cardiac defibrillator: Secondary | ICD-10-CM | POA: Diagnosis not present

## 2020-08-24 DIAGNOSIS — I480 Paroxysmal atrial fibrillation: Secondary | ICD-10-CM | POA: Insufficient documentation

## 2020-08-24 DIAGNOSIS — Z87891 Personal history of nicotine dependence: Secondary | ICD-10-CM | POA: Diagnosis not present

## 2020-08-24 DIAGNOSIS — I959 Hypotension, unspecified: Secondary | ICD-10-CM | POA: Insufficient documentation

## 2020-08-24 DIAGNOSIS — I130 Hypertensive heart and chronic kidney disease with heart failure and stage 1 through stage 4 chronic kidney disease, or unspecified chronic kidney disease: Secondary | ICD-10-CM | POA: Diagnosis not present

## 2020-08-24 DIAGNOSIS — Z79899 Other long term (current) drug therapy: Secondary | ICD-10-CM | POA: Diagnosis not present

## 2020-08-24 DIAGNOSIS — I5022 Chronic systolic (congestive) heart failure: Secondary | ICD-10-CM | POA: Diagnosis not present

## 2020-08-24 DIAGNOSIS — J449 Chronic obstructive pulmonary disease, unspecified: Secondary | ICD-10-CM | POA: Diagnosis not present

## 2020-08-24 DIAGNOSIS — N183 Chronic kidney disease, stage 3 unspecified: Secondary | ICD-10-CM | POA: Insufficient documentation

## 2020-08-24 DIAGNOSIS — Z7901 Long term (current) use of anticoagulants: Secondary | ICD-10-CM | POA: Insufficient documentation

## 2020-08-24 DIAGNOSIS — I10 Essential (primary) hypertension: Secondary | ICD-10-CM

## 2020-08-24 DIAGNOSIS — Z7984 Long term (current) use of oral hypoglycemic drugs: Secondary | ICD-10-CM | POA: Diagnosis not present

## 2020-08-24 LAB — BASIC METABOLIC PANEL
Anion gap: 7 (ref 5–15)
BUN: 21 mg/dL (ref 8–23)
CO2: 30 mmol/L (ref 22–32)
Calcium: 8.7 mg/dL — ABNORMAL LOW (ref 8.9–10.3)
Chloride: 104 mmol/L (ref 98–111)
Creatinine, Ser: 2.12 mg/dL — ABNORMAL HIGH (ref 0.61–1.24)
GFR, Estimated: 32 mL/min — ABNORMAL LOW (ref >60)
Glucose, Bld: 84 mg/dL (ref 70–99)
Potassium: 4.9 mmol/L (ref 3.5–5.1)
Sodium: 141 mmol/L (ref 135–145)

## 2020-08-24 NOTE — Progress Notes (Signed)
Paramedicine Encounter    Patient ID: Brent [name], male    DOB: Jun 10, 1945, 75 y.o.   MRN: [id number]    Saw [name] in clinic today where he was seen by Allena Katz NP. [name] reports feeling okay but having some random bouts of chest aches and pains as well as some dizziness. Vitals and EKG were obtained and labs drawn. Janett Billow elected to not change any medications today but stressed the importance of taking his medications prior to our visits and keeping a BP log. I will assist with this. Medications were reviewed and pill boxes filled. Janett Billow wants [name] to return to a follow up with pharmacy in 3 weeks. I will see [name] in the home in two weeks. Visit complete.   Refills: Eliquis Loperamide Remeron Seroquel    ACTION: Home visit completed

## 2020-08-24 NOTE — Patient Instructions (Addendum)
EKG done today.  Labs done today. We will contact you only if your labs are abnormal.  No medication changes were made. Please continue all current medications as prescribed.  Your physician recommends that you schedule a follow-up appointment in: 3-4 weeks with our pharmacy clinic here in our office.  If you have any questions or concerns before your next appointment please send [name] a message through [place] or call our office at [phone].    TO LEAVE A MESSAGE FOR THE NURSE SELECT OPTION 2, PLEASE LEAVE A MESSAGE INCLUDING: YOUR NAME DATE OF BIRTH CALL BACK NUMBER REASON FOR CALL**this is important as we prioritize the call backs  YOU WILL RECEIVE A CALL BACK THE SAME DAY AS LONG AS YOU CALL BEFORE 4:00 PM   Do the following things EVERYDAY: Weigh yourself in the morning before breakfast. Write it down and keep it in a log. Take your medicines as prescribed Eat low salt foods--Limit salt (sodium) to 2000 mg per day.  Stay as active as you can everyday Limit all fluids for the day to less than 2 liters   At the [facility], you and your health needs are our priority. As part of our continuing mission to provide you with exceptional heart care, we have created designated Provider Care Teams. These Care Teams include your primary Cardiologist (physician) and Advanced Practice Providers (APPs- Physician Assistants and Nurse Practitioners) who all work together to provide you with the care you need, when you need it.   You may see any of the following providers on your designated Care Team at your next follow up: Dr Glori Bickers Dr Haynes Kerns, NP Lyda Jester, [place] Audry Riles, PharmD   Please be sure to bring in all your medications bottles to every appointment.

## 2020-08-24 NOTE — Progress Notes (Signed)
PCP: Wardell Honour, MD Cardiology: Dr. Leandro Reasoner Brent [name] is a 75 y.o. with a history of Crohn's disease, celiac disease, HTN, COPD, prior tobacco use, CKD 3, chronic anemia, and bipolar disorder. Diagnosed with systolic HF in 4/65 (echo with EF 30-35%).     Admitted 3/11-3/16/20 with SOB and hypoxia. Found to have PNA and required BIPAP and antibiotics. Blood cultures were negative. Echo showed newly reduced EF 30-35%. Cardiology consulted and he had nuclear stress test, which was read as "high risk" due to low EF. Per Dr Sallyanne Kuster, did not think changes were indicative of coronary disease and cath was not pursued due this and due to CKD. HF meds optimized. Limited with CKD3. He had one short run of NSVT. Also had AKI on CKD3, but creatinine improved to baseline 1.77 on day of discharge.   He was seen in ED 05/12/18 with SOB after misplacing his nebulizer. BNP was elevated, so he was given 40 mg IV lasix and discharged with lasix 20 mg BID. Referred to HF clinic.   He was admitted again in 7/20 with weakness, orthostatic hypotension, and AKI.  Cardiac meds were held.  He was noted to be severely deconditioned, and he was discharged to SNF.  He remained in SNF x 2 wks and is now home.   Echo was done in 8/20, EF 20-25% with mild LVH and normal RV.  He had RHC in 8/20 showing normal filling pressures and good cardiac output (surprisingly).   LHC/RHC in 3/21 showed no significant CAD, low filling pressures, preserved cardiac output. Echo in 9/21 with EF 25-30%.  Medtronic ICD placed in 11/21.   He returned 1/22 for followup of CHF.  Doing well, weight is down 5 lbs.  Stable NYHA II symptoms. No diarrhea, Crohns seems stable. Main complaint was knee and shoulder arthritis.   He was admitted 3/22 for hypokalemia after having labs drawn at [place]. K was 2.7 on admit. He was treated with calcium gluconate, IV mag sulfate and potassium.   He was evaluated in the ED 6/22 for chest soreness. Cardiac work  up, including device interrogation, was reassuring.   Today he returns for HF follow up. Overall feeling fine. Having some aching in chest. Denies increasing SOB, CP, dizziness, edema, or PND/Orthopnea. Appetite ok. No fever or chills. Weight at home 174 pounds. [facility] helping with ill-fitting dentures, has been eating poorly due to this. Taking all medications. Per paramedicine, nephrology stopped hydralazine (now PRN for SBP >130).  Medtronic device interrogation: OptiVol level below threshold, stable thoracic impedance, 2-3 hrs/day activity, no VT/VF, no AF (Personally reviewed). ECG: SR 100 bpm (personally reviewed).  Labs (7/20): K 5.1, creatinine 2.25 Labs (8/20): K 4.8, creatinine 2.65 Labs (9/20): hgb 9.7 Labs (12/20): K 4.1, creatinine 1.76 Labs (3/21): K 5, creatinine 2.25 Labs (6/21): hgb 12 Labs (11/21): K 4.3, creatinine 2.62, hgb 12.2 Labs (6/22): K 3.9, creatinine 1.93, hgb 11.4  PMH: 1. Crohns disease: Gets Remicade.  2. Celiac disease.  3. COPD: Prior smoker.  - CT chest (7/20) with mild emphysema.  - PFTs (9/20): Mild obstruction, mild restriction.  - High resolution CT chest (10/20): No ILD, +chronic bronchitis.  4. CKD: Stage 3.  5. HTN 6. Bipolar disorder 7. H/o BPPV 8. Gout 9. Chronic systolic CHF: Nonischemic cardiomyopathy.  Medtronic ICD.  - Echo (3/20): EF 30-35%, mild LVH, normal RV.  - Cardiolite (3/20): EF 28%, no ischemia, fixed defects in anteroseptal and inferolateral walls.  - Echo (8/20):  EF 20-25%, diffuse hypokinesis, mild LVH, normal RV size and systolic function, normal IVC size.  - RHC (8/20): mean RA 3, PA 21/10, mean PCWP 5, CI 3.2 Fick and 3.5 Thermo - RHC/LHC (3/21): No significant CAD; mean RA 1, PA 20/6, mean PCWP 6, CI 4.57  Social History   Socioeconomic History   Marital status: Divorced    Spouse name: Not on file   Number of children: 1   Years of education: 12   Highest education level: Not on file  Occupational History    Occupation: retired   Occupation: Veteran  Tobacco Use   Smoking status: Former    Packs/day: 1.00    Years: 49.00    Pack years: 49.00    Types: Cigarettes    Start date: 04/11/1956    Quit date: 04/17/2014    Years since quitting: 6.3   Smokeless tobacco: Never  Vaping Use   Vaping Use: Never used  Substance and Sexual Activity   Alcohol use: No    Alcohol/week: 0.0 standard drinks   Drug use: No   Sexual activity: Never  Other Topics Concern   Not on file  Social History Narrative   Not on file   Social Determinants of Health   Financial Resource Strain: Not on file  Food Insecurity: Not on file  Transportation Needs: Not on file  Physical Activity: Not on file  Stress: Not on file  Social Connections: Not on file  Intimate Partner Violence: Not on file   Family History  Problem Relation Age of Onset   Diabetes Mother        maternal grandmother   Uterine cancer Mother    Emphysema Father    Pneumonia Maternal Grandmother    Colon cancer Neg Hx    ROS: All systems reviewed and negative except as per HPI.   Current Outpatient Medications  Medication Sig Dispense Refill   acetaminophen (TYLENOL) 160 MG/5ML liquid Take 325 mg by mouth as needed for fever.     apixaban (ELIQUIS) 5 MG TABS tablet Take 1 tablet (5 mg total) by mouth 2 (two) times daily. 180 tablet 3   BUDESONIDE PO Take 9 mg by mouth daily.     Calcium 500 MG tablet Take 1 tablet (500 mg total) by mouth 1 day or 1 dose for 1 dose. 30 tablet    Cholecalciferol (VITAMIN D) 125 MCG (5000 UT) CAPS Take 5,000 Units by mouth daily.      dapagliflozin propanediol (FARXIGA) 10 MG TABS tablet Take 1 tablet (10 mg total) by mouth daily before breakfast. 90 tablet 3   divalproex (DEPAKOTE ER) 500 MG 24 hr tablet Take 1,500 mg by mouth at bedtime.      epoetin alfa (EPOGEN) 10000 UNIT/ML injection Inject 10,000 Units into the skin every 30 (thirty) days.     ferrous sulfate 325 (65 FE) MG tablet Take 325 mg by  mouth in the morning and at bedtime.     furosemide (LASIX) 20 MG tablet Take 1 tablet (20 mg total) by mouth every Monday, Wednesday, and Friday. 39 tablet 3   hydrALAZINE (APRESOLINE) 25 MG tablet Take 25 mg by mouth 3 (three) times daily.     inFLIXimab (REMICADE) 100 MG injection Infuse Remicade IV schedule 1 72m/kg every 8 weeks Premedicate with Tylenol 500-6540mby mouth and Benadryl 25-5078my mouth prior to infusion. Last PPD was on 12/2009.  1 each 6   isosorbide mononitrate (IMDUR) 60 MG 24 hr tablet  Take 1 tablet (60 mg total) by mouth daily. 90 tablet 3   ivabradine (CORLANOR) 5 MG TABS tablet TAKE 1 TABLET(5 MG) BY MOUTH TWICE DAILY WITH A MEAL (Patient taking differently: Take 5 mg by mouth 2 (two) times daily with a meal.) 180 tablet 3   loperamide (IMODIUM) 2 MG capsule Take 2 mg by mouth 3 (three) times daily as needed.     Magnesium 400 MG TABS Take 400 mg by mouth daily. 30 tablet 1   metoprolol succinate (TOPROL-XL) 25 MG 24 hr tablet Take 1 tablet (25 mg total) by mouth in the morning and at bedtime. 180 tablet 6   mirtazapine (REMERON) 7.5 MG tablet TAKE 1 TABLET(7.5 MG) BY MOUTH AT BEDTIME 30 tablet 11   NON FORMULARY Mucositis mouth wash- uses as needed     omeprazole (PRILOSEC) 20 MG capsule Take 20 mg by mouth daily.     QUEtiapine (SEROQUEL) 100 MG tablet Take 150 mg by mouth at bedtime.     solifenacin (VESICARE) 5 MG tablet Take 5 mg by mouth daily.     tamsulosin (FLOMAX) 0.4 MG CAPS capsule Take 1 capsule (0.4 mg total) by mouth daily. 30 capsule 2   vitamin B-12 (CYANOCOBALAMIN) 500 MCG tablet Take 1,000 mcg by mouth daily.      allopurinol (ZYLOPRIM) 100 MG tablet Take 2 tablets (200 mg total) by mouth daily. (Patient not taking: Reported on 08/24/2020) 60 tablet 5   No current facility-administered medications for this encounter.   BP (!) 146/84   Pulse (!) 113   Ht 6' 4"  (1.93 m)   Wt 76.2 kg (168 lb)   SpO2 96%   BMI 20.45 kg/m  Wt Readings from Last 3  Encounters:  08/24/20 76.2 kg (168 lb)  08/15/20 76.2 kg (168 lb)  08/14/20 77.6 kg (170 lb 15.8 oz)   Physical Exam: General:  NAD. No resp difficulty, elderly HEENT: Normal Neck: Supple. No JVD. Carotids 2+ bilat; no bruits. No lymphadenopathy or thryomegaly appreciated. Cor: PMI nondisplaced. Regular rate & rhythm. No rubs, gallops or murmurs. Lungs: Clear Abdomen: Soft, nontender, nondistended. No hepatosplenomegaly. No bruits or masses. Good bowel sounds. Extremities: No cyanosis, clubbing, rash, edema Neuro: alert & oriented x 3, cranial nerves grossly intact. Moves all 4 extremities w/o difficulty. Affect pleasant.  Assessment/Plan: 1. Chronic systolic CHF: Nonischemic cardiomyoapthy.  Diagnosed by echo in 3/20, EF 30-35%.  LHC in 3/21 with no significant CAD. He has no chest pain.  Medication titration has been limited by orthostasis/low BP.  Echo in 8/20 showed EF 20-25% with mild LVH and normal RV function.  With resting sinus tachycardia, I was concerned for low output HF, but RHC in 8/20 and again in 3/21 showed normal cardiac output and filling pressures. Echo in 9/21 with EF 25-30%, Medtronic ICD placed.  Today, NYHA class II  his volume is good on exam and by OptiVol.   Weight down. Has had trouble with low BP and orthostasis in the past, but BP up today.  Also med titration has been limited by CKD.  - Continue Imdur 60 mg daily. - ContinueToprol XL 25 mg bid.  - Continue ivabradine 5 mg bid.  HR 100 today. He has not taken daily medications yet. - Continue lasix 20 mg MWF. May need to stop altogether, pending kidney function. - Continue Farxiga 10 mg daily. BMET today. - No ACEI/ARB/ARNI with elevated creatinine.  - If creatinine trending down, add spironolactone next.  - He would  be a difficult candidate for advanced therapies with deconditioning, renal dysfunction, and lives alone.  2. Atrial fibrillation, paroxysmal: Occurred in 2/22. NSR today. On Eliquis. No bleeding. -  Followed by Dr. Lovena Le. 3. CKD: Stage 3.  BMET today. Followed by Dr. Posey Pronto. 4. COPD: Mild emphysema on prior chest CT.  PFTs in 9/20 showed mild obstruction and mild restriction.  High resolution CT chest 10/20 showed no ILD, +chronic bronchitis.  - Repeat CT chests to follow small lung nodule given smoking history.  5. HTN: Elevated today. He has struggled with orthostasis and low BP in the past. Hydralazine is now PRN for SBP>130 per nephrology, however he has difficulty remembering to check BP at home. BP with paramedicine consistently >130s.  - Will log BP at home after taking daily medications and review at next appt.  Follow up in 3-4 weeks with PharmD/APP. May need to add hydralazine back to daily BP regimen. Trend HR, may need increase in ivabradine.  Allena Katz, FNP-BC 08/24/2020

## 2020-08-28 ENCOUNTER — Other Ambulatory Visit (HOSPITAL_COMMUNITY): Payer: Self-pay | Admitting: Unknown Physician Specialty

## 2020-08-28 MED ORDER — DAPAGLIFLOZIN PROPANEDIOL 10 MG PO TABS: 10.0000 mg | ORAL_TABLET | Freq: Every day | ORAL | 3 refills | Status: DC

## 2020-09-05 ENCOUNTER — Other Ambulatory Visit (HOSPITAL_COMMUNITY): Payer: Self-pay

## 2020-09-05 ENCOUNTER — Telehealth: Payer: Self-pay | Admitting: Neurology

## 2020-09-05 NOTE — Progress Notes (Signed)
Paramedicine Encounter    Patient ID: RYLEN SWINDLER, male    DOB: [birth year], 75 y.o.   MRN: [id number]   Patient Care Team: Wardell Honour, MD as PCP - General (Family Medicine) [place]ique, Peter M, MD as PCP - Cardiology (Cardiology) Larey Dresser, MD as PCP - Advanced Heart Failure (Cardiology) Elmarie Shiley, MD (Nephrology) Milus Banister, MD (Gastroenterology) Jorge Ny, LCSW as Social Worker (Licensed Clinical Social Worker)  Patient Active Problem List   Diagnosis Date Noted   Electrolyte abnormality 05/17/2020   ICD (implantable cardioverter-defibrillator) in place 08/17/7626   Chronic systolic heart failure ([place]) 01/02/2020   Decreased appetite 10/08/2019   Weakness 10/08/2019   Hyperkalemia 31/51/7616   Chronic systolic CHF (congestive heart failure) ([place]) 08/26/2019   Type 2 diabetes mellitus with stage 3 chronic kidney disease ([place]) 08/26/2019   Chronic obstructive pulmonary disease ([place]) 06/20/2019   Ventricular tachycardia ([place]) 06/20/2019   Acute on chronic systolic (congestive) heart failure ([place]) 07/37/1062   Metabolic acidosis 69/48/5462   Orthostasis 09/07/2018   AKI (acute kidney injury) ([place])    Immunosuppressed status ([place])    Macrocytic anemia    Chronic combined systolic and diastolic congestive heart failure ([place]) 06/03/2018   Candida esophagitis ([place]) 09/22/2017   History of smoking 30 or more pack years 01/05/2017   Bipolar 1 disorder ([place])    BPH (benign prostatic hyperplasia) 03/31/2013   Anemia in chronic kidney disease 03/31/2013   Essential hypertension, benign 03/31/2013   Acute renal failure superimposed on stage 3 chronic kidney disease ([place]) 06/04/2012   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[place]) 01/23/2010    Current Outpatient Medications:    acetaminophen (TYLENOL) 160 MG/5ML liquid, Take 325 mg by mouth as needed for fever., Disp: , Rfl:    allopurinol (ZYLOPRIM) 100 MG tablet, Take 2 tablets (200 mg total) by mouth daily. (Patient not  taking: Reported on 08/24/2020), Disp: 60 tablet, Rfl: 5   apixaban (ELIQUIS) 5 MG TABS tablet, Take 1 tablet (5 mg total) by mouth 2 (two) times daily., Disp: 180 tablet, Rfl: 3   BUDESONIDE PO, Take 9 mg by mouth daily., Disp: , Rfl:    Calcium 500 MG tablet, Take 1 tablet (500 mg total) by mouth 1 day or 1 dose for 1 dose., Disp: 30 tablet, Rfl:    Cholecalciferol (VITAMIN D) 125 MCG (5000 UT) CAPS, Take 5,000 Units by mouth daily. , Disp: , Rfl:    dapagliflozin propanediol (FARXIGA) 10 MG TABS tablet, Take 1 tablet (10 mg total) by mouth daily before breakfast., Disp: 90 tablet, Rfl: 3   divalproex (DEPAKOTE ER) 500 MG 24 hr tablet, Take 1,500 mg by mouth at bedtime. , Disp: , Rfl:    epoetin alfa (EPOGEN) 10000 UNIT/ML injection, Inject 10,000 Units into the skin every 30 (thirty) days., Disp: , Rfl:    ferrous sulfate 325 (65 FE) MG tablet, Take 325 mg by mouth in the morning and at bedtime., Disp: , Rfl:    furosemide (LASIX) 20 MG tablet, Take 1 tablet (20 mg total) by mouth every Monday, Wednesday, and Friday., Disp: 39 tablet, Rfl: 3   hydrALAZINE (APRESOLINE) 25 MG tablet, Take 25 mg by mouth 3 (three) times daily., Disp: , Rfl:    inFLIXimab (REMICADE) 100 MG injection, Infuse Remicade IV schedule 1 13m/kg every 8 weeks Premedicate with Tylenol 500-6573mby mouth and Benadryl 25-5055my mouth prior to infusion. Last PPD was on 12/2009. , Disp: 1 each, Rfl: 6   isosorbide  mononitrate (IMDUR) 60 MG 24 hr tablet, Take 1 tablet (60 mg total) by mouth daily., Disp: 90 tablet, Rfl: 3   ivabradine (CORLANOR) 5 MG TABS tablet, TAKE 1 TABLET(5 MG) BY MOUTH TWICE DAILY WITH A MEAL (Patient taking differently: Take 5 mg by mouth 2 (two) times daily with a meal.), Disp: 180 tablet, Rfl: 3   loperamide (IMODIUM) 2 MG capsule, Take 2 mg by mouth 3 (three) times daily as needed., Disp: , Rfl:    Magnesium 400 MG TABS, Take 400 mg by mouth daily., Disp: 30 tablet, Rfl: 1   metoprolol succinate (TOPROL-XL)  25 MG 24 hr tablet, Take 1 tablet (25 mg total) by mouth in the morning and at bedtime., Disp: 180 tablet, Rfl: 6   mirtazapine (REMERON) 7.5 MG tablet, TAKE 1 TABLET(7.5 MG) BY MOUTH AT BEDTIME, Disp: 30 tablet, Rfl: 11   NON FORMULARY, Mucositis mouth wash- uses as needed, Disp: , Rfl:    omeprazole (PRILOSEC) 20 MG capsule, Take 20 mg by mouth daily., Disp: , Rfl:    QUEtiapine (SEROQUEL) 100 MG tablet, Take 150 mg by mouth at bedtime., Disp: , Rfl:    solifenacin (VESICARE) 5 MG tablet, Take 5 mg by mouth daily., Disp: , Rfl:    tamsulosin (FLOMAX) 0.4 MG CAPS capsule, Take 1 capsule (0.4 mg total) by mouth daily., Disp: 30 capsule, Rfl: 2   vitamin B-12 (CYANOCOBALAMIN) 500 MCG tablet, Take 1,000 mcg by mouth daily. , Disp: , Rfl:  Allergies  Allergen Reactions   Azathioprine Other (See Comments)    REACTION: affected WBC "Almost died"   Ciprofloxacin Other (See Comments)    Reaction not recalled   Levaquin [Levofloxacin In D5w] Other (See Comments)    Reaction not rec   Plendil [Felodipine] Other (See Comments)    Reaction not not recalled     Social History   Socioeconomic History   Marital status: Divorced    Spouse name: Not on file   Number of children: 1   Years of education: 12   Highest education level: Not on file  Occupational History   Occupation: retired   Occupation: Veteran  Tobacco Use   Smoking status: Former    Packs/day: 1.00    Years: 49.00    Pack years: 49.00    Types: Cigarettes    Start date: 04/11/1956    Quit date: 04/17/2014    Years since quitting: 6.3   Smokeless tobacco: Never  Vaping Use   Vaping Use: Never used  Substance and Sexual Activity   Alcohol use: No    Alcohol/week: 0.0 standard drinks   Drug use: No   Sexual activity: Never  Other Topics Concern   Not on file  Social History Narrative   Not on file   Social Determinants of Health   Financial Resource Strain: Not on file  Food Insecurity: Not on file  Transportation  Needs: Not on file  Physical Activity: Not on file  Stress: Not on file  Social Connections: Not on file  Intimate Partner Violence: Not on file    Physical Exam Vitals reviewed.  Constitutional:      Appearance: Normal appearance. He is normal weight.  HENT:     Head: Normocephalic.     Nose: Nose normal.     Mouth/Throat:     Mouth: Mucous membranes are moist.     Pharynx: Oropharynx is clear.  Eyes:     Conjunctiva/sclera: Conjunctivae normal.     Pupils: Pupils  are equal, round, and reactive to light.  Cardiovascular:     Rate and Rhythm: Normal rate and regular rhythm.     Pulses: Normal pulses.     Heart sounds: Normal heart sounds.  Pulmonary:     Effort: Pulmonary effort is normal.     Breath sounds: Normal breath sounds.  Abdominal:     General: Abdomen is flat.  Musculoskeletal:        General: Normal range of motion.     Cervical back: Normal range of motion.     Right lower leg: No edema.     Left lower leg: No edema.  Skin:    General: Skin is warm and dry.     Capillary Refill: Capillary refill takes less than 2 seconds.  Neurological:     General: No focal deficit present.     Mental Status: He is alert. Mental status is at baseline.  Psychiatric:        Mood and Affect: Mood normal.    Arrived for home visit for [name] reporting to be feeling good. He has been compliant with his medications over the last two weeks only missing one or two doses. I obtained vitals and assessment.  [name] denied chest pain, dizziness, shortness of breath unless walking outdoors in the heat.   Meds reviewed. One pill box filled accordingly. Still missing Allupurinol- [facility] I will have Dr. Sabra Heck resend it.   Appointments reviewed. I will see [name] in one week. Home visit complete.   Refills: Ferrous Sulfate [place]      Future Appointments  Date Time Provider  [place]  09/27/2020 11:00 AM [facility] PHARMACY [facility] None  10/02/2020  7:00 AM [facility] DEVICE REMOTES [facility]  10/30/2020  1:00 PM Wardell Honour, MD [facility] None  01/01/2021  7:00 AM [facility] DEVICE REMOTES [facility]  01/29/2021  1:00 PM [name], Dinah C, NP [facility] None  04/02/2021  7:00 AM [facility] DEVICE REMOTES [facility]  07/02/2021  7:00 AM [facility] DEVICE REMOTES [facility]     ACTION: Home visit completed

## 2020-09-05 NOTE — Telephone Encounter (Signed)
Pt has called to inform Dr Rexene Alberts that the place he was supposed to have his MRI informed him he is unable to have a MRI because of his defibrillator.  Pt would also like a call to discuss the idea of being tested for Parkinson's

## 2020-09-06 NOTE — Telephone Encounter (Signed)
I called pt back and relayed note from Dr. Rexene Alberts.  Pt is agreeable to pursuing CT scan.  Pt advised he would be contacted to set this appt up.

## 2020-09-06 NOTE — Telephone Encounter (Signed)
Called pt back at [phone] and LVM with office number for call back.

## 2020-09-06 NOTE — Telephone Encounter (Signed)
Patient returned my call.  He states the imaging told him to give Brent [name] a call because they cannot do his MRI due to a defibrillator.  I let him know I will be in touch with our staff regarding an alternative place to image him.  Also advised per Dr. Guadelupe Sabin note, no concerns for Parkinson's disease at this time.  The symptoms that may have some similar features she feels are related to medication.

## 2020-09-06 NOTE — Telephone Encounter (Signed)
I have made an addendum to my office note and canceled the brain MRI, I ordered a head CT without contrast.

## 2020-09-06 NOTE — Telephone Encounter (Signed)
Order for CT sent to [facility], no auth required. They will call patient to schedule.

## 2020-09-06 NOTE — Addendum Note (Signed)
Addended by: Star Age on: 09/06/2020 12:20 PM   Modules accepted: Orders

## 2020-09-12 ENCOUNTER — Other Ambulatory Visit (HOSPITAL_COMMUNITY): Payer: Self-pay

## 2020-09-12 NOTE — Progress Notes (Signed)
Paramedicine Encounter    Patient ID: Brent [name], male    DOB: 1946-01-14, 75 y.o.   MRN: [id number]   Patient Care Team: Wardell Honour, MD as PCP - General (Family Medicine) [place]ique, Peter M, MD as PCP - Cardiology (Cardiology) Larey Dresser, MD as PCP - Advanced Heart Failure (Cardiology) Elmarie Shiley, MD (Nephrology) Milus Banister, MD (Gastroenterology) Jorge Ny, LCSW as Social Worker (Licensed Clinical Social Worker)  Patient Active Problem List   Diagnosis Date Noted   Electrolyte abnormality 05/17/2020   ICD (implantable cardioverter-defibrillator) in place 09/73/5329   Chronic systolic heart failure ([place]) 01/02/2020   Decreased appetite 10/08/2019   Weakness 10/08/2019   Hyperkalemia 92/42/6834   Chronic systolic CHF (congestive heart failure) ([place]) 08/26/2019   Type 2 diabetes mellitus with stage 3 chronic kidney disease ([place]) 08/26/2019   Chronic obstructive pulmonary disease ([place]) 06/20/2019   Ventricular tachycardia ([place]) 06/20/2019   Acute on chronic systolic (congestive) heart failure ([place]) 19/62/2297   Metabolic acidosis 98/92/1194   Orthostasis 09/07/2018   AKI (acute kidney injury) ([place])    Immunosuppressed status ([place])    Macrocytic anemia    Chronic combined systolic and diastolic congestive heart failure ([place]) 06/03/2018   Candida esophagitis ([place]) 09/22/2017   History of smoking 30 or more pack years 01/05/2017   Bipolar 1 disorder ([place])    BPH (benign prostatic hyperplasia) 03/31/2013   Anemia in chronic kidney disease 03/31/2013   Essential hypertension, benign 03/31/2013   Acute renal failure superimposed on stage 3 chronic kidney disease ([place]) 06/04/2012   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[place]) 01/23/2010    Current Outpatient Medications:    acetaminophen (TYLENOL) 160 MG/5ML liquid, Take 325 mg by mouth as needed for fever., Disp: , Rfl:    allopurinol (ZYLOPRIM) 100 MG tablet, Take 2 tablets (200 mg total) by mouth daily. (Patient not  taking: Reported on 09/05/2020), Disp: 60 tablet, Rfl: 5   apixaban (ELIQUIS) 5 MG TABS tablet, Take 1 tablet (5 mg total) by mouth 2 (two) times daily., Disp: 180 tablet, Rfl: 3   BUDESONIDE PO, Take 9 mg by mouth daily., Disp: , Rfl:    Calcium 500 MG tablet, Take 1 tablet (500 mg total) by mouth 1 day or 1 dose for 1 dose., Disp: 30 tablet, Rfl:    Cholecalciferol (VITAMIN D) 125 MCG (5000 UT) CAPS, Take 5,000 Units by mouth daily. , Disp: , Rfl:    dapagliflozin propanediol (FARXIGA) 10 MG TABS tablet, Take 1 tablet (10 mg total) by mouth daily before breakfast., Disp: 90 tablet, Rfl: 3   divalproex (DEPAKOTE ER) 500 MG 24 hr tablet, Take 1,500 mg by mouth at bedtime. , Disp: , Rfl:    epoetin alfa (EPOGEN) 10000 UNIT/ML injection, Inject 10,000 Units into the skin every 30 (thirty) days., Disp: , Rfl:    ferrous sulfate 325 (65 FE) MG tablet, Take 325 mg by mouth in the morning and at bedtime., Disp: , Rfl:    furosemide (LASIX) 20 MG tablet, Take 1 tablet (20 mg total) by mouth every Monday, Wednesday, and Friday., Disp: 39 tablet, Rfl: 3   hydrALAZINE (APRESOLINE) 25 MG tablet, Take 25 mg by mouth 3 (three) times daily. (Patient not taking: Reported on 09/05/2020), Disp: , Rfl:    inFLIXimab (REMICADE) 100 MG injection, Infuse Remicade IV schedule 1 63m/kg every 8 weeks Premedicate with Tylenol 500-6511mby mouth and Benadryl 25-5036my mouth prior to infusion. Last PPD was on 12/2009. , Disp: 1  each, Rfl: 6   isosorbide mononitrate (IMDUR) 60 MG 24 hr tablet, Take 1 tablet (60 mg total) by mouth daily., Disp: 90 tablet, Rfl: 3   ivabradine (CORLANOR) 5 MG TABS tablet, TAKE 1 TABLET(5 MG) BY MOUTH TWICE DAILY WITH A MEAL (Patient taking differently: Take 5 mg by mouth 2 (two) times daily with a meal.), Disp: 180 tablet, Rfl: 3   loperamide (IMODIUM) 2 MG capsule, Take 2 mg by mouth 3 (three) times daily as needed., Disp: , Rfl:    Magnesium 400 MG TABS, Take 400 mg by mouth daily., Disp: 30  tablet, Rfl: 1   metoprolol succinate (TOPROL-XL) 25 MG 24 hr tablet, Take 1 tablet (25 mg total) by mouth in the morning and at bedtime., Disp: 180 tablet, Rfl: 6   mirtazapine (REMERON) 7.5 MG tablet, TAKE 1 TABLET(7.5 MG) BY MOUTH AT BEDTIME, Disp: 30 tablet, Rfl: 11   NON FORMULARY, Mucositis mouth wash- uses as needed, Disp: , Rfl:    omeprazole (PRILOSEC) 20 MG capsule, Take 20 mg by mouth daily., Disp: , Rfl:    QUEtiapine (SEROQUEL) 100 MG tablet, Take 150 mg by mouth at bedtime., Disp: , Rfl:    solifenacin (VESICARE) 5 MG tablet, Take 5 mg by mouth daily., Disp: , Rfl:    tamsulosin (FLOMAX) 0.4 MG CAPS capsule, Take 1 capsule (0.4 mg total) by mouth daily., Disp: 30 capsule, Rfl: 2   vitamin B-12 (CYANOCOBALAMIN) 500 MCG tablet, Take 1,000 mcg by mouth daily. , Disp: , Rfl:  Allergies  Allergen Reactions   Azathioprine Other (See Comments)    REACTION: affected WBC "Almost died"   Ciprofloxacin Other (See Comments)    Reaction not recalled   Levaquin [Levofloxacin In D5w] Other (See Comments)    Reaction not rec   Plendil [Felodipine] Other (See Comments)    Reaction not not recalled     Social History   Socioeconomic History   Marital status: Divorced    Spouse name: Not on file   Number of children: 1   Years of education: 12   Highest education level: Not on file  Occupational History   Occupation: retired   Occupation: Veteran  Tobacco Use   Smoking status: Former    Packs/day: 1.00    Years: 49.00    Pack years: 49.00    Types: Cigarettes    Start date: 04/11/1956    Quit date: 04/17/2014    Years since quitting: 6.4   Smokeless tobacco: Never  Vaping Use   Vaping Use: Never used  Substance and Sexual Activity   Alcohol use: No    Alcohol/week: 0.0 standard drinks   Drug use: No   Sexual activity: Never  Other Topics Concern   Not on file  Social History Narrative   Not on file   Social Determinants of Health   Financial Resource Strain: Not on  file  Food Insecurity: Not on file  Transportation Needs: Not on file  Physical Activity: Not on file  Stress: Not on file  Social Connections: Not on file  Intimate Partner Violence: Not on file    Physical Exam Vitals reviewed.  Constitutional:      Appearance: Normal appearance. He is normal weight.  HENT:     Head: Normocephalic.     Nose: Nose normal.     Mouth/Throat:     Mouth: Mucous membranes are moist.     Pharynx: Oropharynx is clear.  Eyes:     Conjunctiva/sclera: Conjunctivae normal.  Pupils: Pupils are equal, round, and reactive to light.  Cardiovascular:     Rate and Rhythm: Normal rate and regular rhythm.     Pulses: Normal pulses.     Heart sounds: Normal heart sounds.  Pulmonary:     Effort: Pulmonary effort is normal.     Breath sounds: Normal breath sounds.  Abdominal:     General: Abdomen is flat.     Palpations: Abdomen is soft.  Musculoskeletal:        General: No swelling. Normal range of motion.     Cervical back: Normal range of motion.     Right lower leg: No edema.     Left lower leg: No edema.  Skin:    General: Skin is warm and dry.     Capillary Refill: Capillary refill takes less than 2 seconds.  Neurological:     General: No focal deficit present.     Mental Status: He is alert. Mental status is at baseline.  Psychiatric:        Mood and Affect: Mood normal.    Arrived for home visit for Brent [name] who was alert and oriented reporting to be feeling "okay" today. Brent [name] stated that he woke up not feeling good but is feeling much better now. He denied chest pain, shortness of breath, dizziness or trouble sleeping. He does report lack of appetite due to his partials being out and needing to be replaced which will be happening on Monday 7/25. Brent [name] stated that he has been eating a lot of soups, I advised him to be sure he is eating soups with little to no sodium. He agreed with this plan.   I obtained vitals as assessment as noted.  We  reviewed medications and I filled two weeks of pill boxes for Brent [name].   We reviewed appointments and confirmed them.   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 He will be following up with his PCP and their agency.   Brent [name] agreed to home visit in two weeks, and understands to call me if needed. Home visit complete.   Refills: Ferrous Sulfate    Future Appointments  Date Time Provider [place]  09/14/2020 11:00 AM [facility] RM 1 [facility] None  09/27/2020 11:00 AM [facility] PHARMACY [facility] None  10/02/2020  7:00 AM [facility] DEVICE REMOTES [facility]  10/30/2020  1:00 PM Wardell Honour, MD [facility] None  01/01/2021  7:00 AM [facility] DEVICE REMOTES [facility]  01/29/2021  1:00 PM [name], Dinah C, NP [facility] None  04/02/2021  7:00 AM [facility] DEVICE REMOTES [facility]  07/02/2021  7:00 AM [facility] DEVICE REMOTES [facility]     ACTION: Home visit completed

## 2020-09-14 ENCOUNTER — Other Ambulatory Visit: Payer: Self-pay

## 2020-09-14 ENCOUNTER — Non-Acute Institutional Stay (HOSPITAL_COMMUNITY)
Admission: RE | Admit: 2020-09-14 | Discharge: 2020-09-14 | Disposition: A | Discharge status: Home / Self Care | Payer: Medicare Other | Source: Ambulatory Visit | Attending: Internal Medicine | Admitting: Internal Medicine

## 2020-09-14 DIAGNOSIS — D631 Anemia in chronic kidney disease: Secondary | ICD-10-CM | POA: Diagnosis not present

## 2020-09-14 DIAGNOSIS — N189 Chronic kidney disease, unspecified: Secondary | ICD-10-CM | POA: Insufficient documentation

## 2020-09-14 LAB — HEMOGLOBIN AND HEMATOCRIT, BLOOD
HCT: 31.7 % — ABNORMAL LOW (ref 39.0–52.0)
Hemoglobin: 9.9 g/dL — ABNORMAL LOW (ref 13.0–17.0)

## 2020-09-14 LAB — FERRITIN: Ferritin: 1160 ng/mL — ABNORMAL HIGH (ref 24–336)

## 2020-09-14 LAB — IRON AND TIBC
Iron: 41 ug/dL — ABNORMAL LOW (ref 45–182)
Saturation Ratios: 23 % (ref 17.9–39.5)
TIBC: 181 ug/dL — ABNORMAL LOW (ref 250–450)
UIBC: 140 ug/dL

## 2020-09-14 MED ORDER — EPOETIN ALFA 10000 UNIT/ML IJ SOLN
10000.0000 [IU] | Freq: Once | INTRAMUSCULAR | Status: AC
  Administered 2020-09-14: 10000 [IU] via SUBCUTANEOUS
  Filled 2020-09-14: qty 1

## 2020-09-14 NOTE — Progress Notes (Signed)
PATIENT CARE CENTER NOTE   Diagnosis: Anemia associated with Chronic Renal Failure, anemia associated with renal disease     Provider: Elmarie Shiley MD     Procedure: Epoetin Alfa (Procrit) injection     Note: Patient received sub-q Procrit injection in left arm. Tolerated well. Labs drawn pre-injection and Hemoglobin was 9.9. Patient's BP elevated but within parameters at 156/93. Patient reports that he forgot to take BP mediations today. Discharge instructions given. Patient alert, oriented and ambulatory at discharge.

## 2020-09-18 ENCOUNTER — Encounter: Payer: Self-pay | Admitting: Family Medicine

## 2020-09-18 ENCOUNTER — Ambulatory Visit (INDEPENDENT_AMBULATORY_CARE_PROVIDER_SITE_OTHER): Payer: Medicare Other | Admitting: Family Medicine

## 2020-09-18 ENCOUNTER — Other Ambulatory Visit: Payer: Self-pay

## 2020-09-18 VITALS — BP 152/92 | HR 81 | Temp 97.5°F | Resp 20 | Ht 76.0 in | Wt 170.9 lb

## 2020-09-18 DIAGNOSIS — N1832 Chronic kidney disease, stage 3b: Secondary | ICD-10-CM | POA: Diagnosis not present

## 2020-09-18 DIAGNOSIS — F319 Bipolar disorder, unspecified: Secondary | ICD-10-CM | POA: Diagnosis not present

## 2020-09-18 DIAGNOSIS — D631 Anemia in chronic kidney disease: Secondary | ICD-10-CM

## 2020-09-18 DIAGNOSIS — Z95810 Presence of automatic (implantable) cardiac defibrillator: Secondary | ICD-10-CM | POA: Diagnosis not present

## 2020-09-18 NOTE — Progress Notes (Signed)
Provider:  Alain Honey, MD  Careteam: Patient Care Team: Wardell Honour, MD as PCP - General (Family Medicine) Martinique, Peter M, MD as PCP - Cardiology (Cardiology) Larey Dresser, MD as PCP - Advanced Heart Failure (Cardiology) Elmarie Shiley, MD (Nephrology) Milus Banister, MD (Gastroenterology) Jorge Ny, LCSW as Social Worker (Licensed Clinical Social Worker)  PLACE OF SERVICE:  [place]  Advanced Directive information    Allergies  Allergen Reactions   Azathioprine Other (See Comments)    REACTION: affected WBC "Almost died"   Ciprofloxacin Other (See Comments)    Reaction not recalled   Levaquin [Levofloxacin In D5w] Other (See Comments)    Reaction not rec   Plendil [Felodipine] Other (See Comments)    Reaction not not recalled    No chief complaint on file.    HPI: Patient is a 75 y.o. male  .  Brent [name] is here today for me to complete some paperwork for the [employer] so that he can get disability based on his diagnosis of PTSD.  He does have significant heart failure as well as chronic renal failure.  These problems limit his functioning but as regards PTSD he has depression with apathy as well as a strong startle reflex.  He is able to leave his home and drive.  He is able to feed himself but not able to prepare his own meals.  He does not need assistance with bathing and tending to other hygiene needs.  He is not legally blind and does not require nursing home care.  He manages his own medicines.  He does have ability (mental capacity) to manage his benefit payments.  Review of Systems:  Review of Systems  Constitutional:  Positive for weight loss.  Respiratory:  Positive for shortness of breath.   Cardiovascular:  Positive for leg swelling. Negative for chest pain.  Neurological:  Positive for weakness.  Psychiatric/Behavioral:  Positive for depression. The patient is nervous/anxious.   All other systems reviewed and are negative.  Past Medical  History:  Diagnosis Date   Anemia    Anxiety    Benign paroxysmal positional vertigo    Bipolar I disorder, most recent episode (or current) unspecified    Celiac disease    CHF (congestive heart failure) (HCC)    Chronic kidney disease, stage III (moderate) (HCC)    Crohn's    Remicade q8 weeks   Depression    Essential and other specified forms of tremor    medication-induced Parkinson's, now resolved   Essential hypertension, benign    Gout 2018   Heart failure ([place])    Hypertrophy of prostate without urinary obstruction and other lower urinary tract symptoms (LUTS)    Impotence of organic origin    Insomnia with sleep apnea, unspecified    Iron deficiency anemia, unspecified    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   Neuralgia, neuritis, and radiculitis, unspecified    Other B-complex deficiencies    Other extrapyramidal disease and abnormal movement disorder    Postinflammatory pulmonary fibrosis ([place])    Tobacco use disorder    Vertigo 2018   Past Surgical History:  Procedure Laterality Date   CATARACT EXTRACTION, BILATERAL Bilateral 09/2018   CHOLECYSTECTOMY  07-12-2010   ICD IMPLANT N/A 01/02/2020   Procedure: ICD IMPLANT;  Surgeon: Evans Lance, MD;  Location: [facility];  Service: Cardiovascular;  Laterality: N/A;   RIGHT HEART CATH N/A 10/12/2018   Procedure: RIGHT HEART CATH;  Surgeon: Glori Bickers  R, MD;  Location: [facility];  Service: Cardiovascular;  Laterality: N/A;   RIGHT/LEFT HEART CATH AND CORONARY ANGIOGRAPHY N/A 04/26/2019   Procedure: RIGHT/LEFT HEART CATH AND CORONARY ANGIOGRAPHY;  Surgeon: Larey Dresser, MD;  Location: [facility];  Service: Cardiovascular;  Laterality: N/A;   SMALL INTESTINE SURGERY     x 2   Social History:   reports that he quit smoking about 6 years ago. His smoking use included cigarettes. He started smoking about 64 years ago. He has a 49.00 pack-year smoking history. He has never used smokeless tobacco. He  reports that he does not drink alcohol and does not use drugs.  Family History  Problem Relation Age of Onset   Diabetes Mother        maternal grandmother   Uterine cancer Mother    Emphysema Father    Pneumonia Maternal Grandmother    Colon cancer Neg Hx     Medications: Patient's Medications  New Prescriptions   No medications on file  Previous Medications   ACETAMINOPHEN (TYLENOL) 160 MG/5ML LIQUID    Take 325 mg by mouth as needed for fever.   ALLOPURINOL (ZYLOPRIM) 100 MG TABLET    Take 2 tablets (200 mg total) by mouth daily.   APIXABAN (ELIQUIS) 5 MG TABS TABLET    Take 1 tablet (5 mg total) by mouth 2 (two) times daily.   BUDESONIDE PO    Take 9 mg by mouth daily.   CALCIUM 500 MG TABLET    Take 1 tablet (500 mg total) by mouth 1 day or 1 dose for 1 dose.   CHOLECALCIFEROL (VITAMIN D) 125 MCG (5000 UT) CAPS    Take 5,000 Units by mouth daily.    DAPAGLIFLOZIN PROPANEDIOL (FARXIGA) 10 MG TABS TABLET    Take 1 tablet (10 mg total) by mouth daily before breakfast.   DIVALPROEX (DEPAKOTE ER) 500 MG 24 HR TABLET    Take 1,500 mg by mouth at bedtime.    EPOETIN ALFA (EPOGEN) 10000 UNIT/ML INJECTION    Inject 10,000 Units into the skin every 30 (thirty) days.   FERROUS SULFATE 325 (65 FE) MG TABLET    Take 325 mg by mouth in the morning and at bedtime.   FUROSEMIDE (LASIX) 20 MG TABLET    Take 1 tablet (20 mg total) by mouth every Monday, Wednesday, and Friday.   HYDRALAZINE (APRESOLINE) 25 MG TABLET    Take 25 mg by mouth 3 (three) times daily.   INFLIXIMAB (REMICADE) 100 MG INJECTION    Infuse Remicade IV schedule 1 3m/kg every 8 weeks Premedicate with Tylenol 500-6539mby mouth and Benadryl 25-5061my mouth prior to infusion. Last PPD was on 12/2009.    ISOSORBIDE MONONITRATE (IMDUR) 60 MG 24 HR TABLET    Take 1 tablet (60 mg total) by mouth daily.   IVABRADINE (CORLANOR) 5 MG TABS TABLET    TAKE 1 TABLET(5 MG) BY MOUTH TWICE DAILY WITH A MEAL   LOPERAMIDE (IMODIUM) 2 MG  CAPSULE    Take 2 mg by mouth 3 (three) times daily as needed.   MAGNESIUM 400 MG TABS    Take 400 mg by mouth daily.   METOPROLOL SUCCINATE (TOPROL-XL) 25 MG 24 HR TABLET    Take 1 tablet (25 mg total) by mouth in the morning and at bedtime.   MIRTAZAPINE (REMERON) 7.5 MG TABLET    TAKE 1 TABLET(7.5 MG) BY MOUTH AT BEDTIME   NON FORMULARY  Mucositis mouth wash- uses as needed   OMEPRAZOLE (PRILOSEC) 20 MG CAPSULE    Take 20 mg by mouth daily.   QUETIAPINE (SEROQUEL) 100 MG TABLET    Take 150 mg by mouth at bedtime.   SOLIFENACIN (VESICARE) 5 MG TABLET    Take 5 mg by mouth daily.   TAMSULOSIN (FLOMAX) 0.4 MG CAPS CAPSULE    Take 1 capsule (0.4 mg total) by mouth daily.   VITAMIN B-12 (CYANOCOBALAMIN) 500 MCG TABLET    Take 1,000 mcg by mouth daily.   Modified Medications   No medications on file  Discontinued Medications   No medications on file    Physical Exam:  There were no vitals filed for this visit. There is no height or weight on file to calculate BMI. Wt Readings from Last 3 Encounters:  09/12/20 174 lb (78.9 kg)  09/05/20 169 lb (76.7 kg)  08/24/20 168 lb (76.2 kg)    Physical Exam Vitals and nursing note reviewed.  Constitutional:      Appearance: Normal appearance.  HENT:     Head: Normocephalic.  Cardiovascular:     Rate and Rhythm: Normal rate and regular rhythm.  Pulmonary:     Effort: Pulmonary effort is normal.     Breath sounds: Normal breath sounds.  Neurological:     General: No focal deficit present.     Mental Status: He is alert and oriented to person, place, and time.  Psychiatric:        Mood and Affect: Mood normal.        Behavior: Behavior normal.    Labs reviewed: Basic Metabolic Panel: Recent Labs    09/28/19 0946 10/20/19 1158 11/14/19 1413 05/18/20 0524 05/18/20 1451 05/19/20 0234 06/26/20 1434 08/14/20 1017 08/24/20 1241  NA 141 142   < > 141 140   < > 142 140 141  K 5.2 5.7*   < > 2.8* 3.0*   < > 2.9* 3.9 4.9  CL 114*  112*   < > 112* 112*   < > 107 106 104  CO2 20 27   < > 20* 22   < > 22 25 30   GLUCOSE 102* 78   < > 74 116*   < > 96 83 84  BUN 42* 27*   < > 24* 22   < > 38* 22 21  CREATININE 2.55* 2.08*   < > 1.78* 1.99*   < > 2.90* 1.93* 2.12*  CALCIUM 8.9 9.2   < > 6.2*  6.1 6.8*   < > 8.2* 8.3* 8.7*  MG  --   --    < > 0.9* 1.3*  --  1.2*  --   --   TSH 1.22 2.46  --   --   --   --   --   --   --    < > = values in this interval not displayed.   Liver Function Tests: Recent Labs    10/20/19 1158 05/18/20 0524 08/14/20 1017  AST 11 11*  11* 9*  ALT 8* 10  9 8   ALKPHOS  --  43  44 57  BILITOT 0.3 0.6  0.5 0.5  PROT 6.7 5.9*  5.9* 6.1*  ALBUMIN  --  2.3*  2.3* 2.5*   No results for input(s): LIPASE, AMYLASE in the last 8760 hours. No results for input(s): AMMONIA in the last 8760 hours. CBC: Recent Labs    05/17/20 1814 05/18/20 0524 05/19/20 0234  05/25/20 1035 07/20/20 0850 08/14/20 1017 09/14/20 1105  WBC 11.1* 9.4 9.5  --   --  10.1  --   NEUTROABS 6.8 5.6  --   --   --  5.9  --   HGB 11.0* 9.8* 10.0*   < > 11.1* 11.4* 9.9*  HCT 35.5* 30.7* 31.8*   < > 36.0* 36.0* 31.7*  MCV 107.6* 104.4* 103.9*  --   --  107.8*  --   PLT 133* 123* 130*  --   --  108*  --    < > = values in this interval not displayed.   Lipid Panel: Recent Labs    10/20/19 1158  CHOL 161  HDL 54  LDLCALC 86  TRIG 112  CHOLHDL 3.0   TSH: Recent Labs    09/28/19 0946 10/20/19 1158  TSH 1.22 2.46   A1C: Lab Results  Component Value Date   HGBA1C 5.1 08/27/2019     Assessment/Plan  1. Anemia in stage 3b chronic kidney disease ([place]) Patient is receiving injections of erythropoietin as well as iron supplements.  I think he may have missed an injection 1 month and hemoglobin is now 9.9.  This needs to be optimized as it relates to his congestive heart failure  2. Bipolar 1 disorder ([place]) He takes Seroquel at bedtime.  Seems to be stable  3. ICD (implantable cardioverter-defibrillator)  in place Followed closely by cardiology.  There has been some decline in his ejection fraction and recent weeks.  He appears weaker.   Brent Honey, MD [facility] [phone]

## 2020-09-26 ENCOUNTER — Other Ambulatory Visit (HOSPITAL_COMMUNITY): Payer: Self-pay

## 2020-09-26 NOTE — Progress Notes (Signed)
Paramedicine Encounter    Patient ID: Brent [name], male    DOB: [birth year], 75 y.o.   MRN: [id number]   Patient Care Team: Wardell Honour, MD as PCP - General (Family Medicine) Martinique, Peter M, MD as PCP - Cardiology (Cardiology) Larey Dresser, MD as PCP - Advanced Heart Failure (Cardiology) Elmarie Shiley, MD (Nephrology) Milus Banister, MD (Gastroenterology) Jorge Ny, LCSW as Social Worker (Licensed Clinical Social Worker)  Patient Active Problem List   Diagnosis Date Noted   Electrolyte abnormality 05/17/2020   ICD (implantable cardioverter-defibrillator) in place 65/68/1275   Chronic systolic heart failure ([place]) 01/02/2020   Decreased appetite 10/08/2019   Weakness 10/08/2019   Hyperkalemia 17/00/1749   Chronic systolic CHF (congestive heart failure) ([place]) 08/26/2019   Type 2 diabetes mellitus with stage 3 chronic kidney disease ([place]) 08/26/2019   Chronic obstructive pulmonary disease ([place]) 06/20/2019   Ventricular tachycardia ([place]) 06/20/2019   Acute on chronic systolic (congestive) heart failure ([place]) 44/96/7591   Metabolic acidosis 63/84/6659   Orthostasis 09/07/2018   AKI (acute kidney injury) ([place])    Immunosuppressed status ([place])    Macrocytic anemia    Chronic combined systolic and diastolic congestive heart failure ([place]) 06/03/2018   Candida esophagitis ([place]) 09/22/2017   History of smoking 30 or more pack years 01/05/2017   Bipolar 1 disorder ([place])    BPH (benign prostatic hyperplasia) 03/31/2013   Anemia in chronic kidney disease 03/31/2013   Essential hypertension, benign 03/31/2013   Acute renal failure superimposed on stage 3 chronic kidney disease ([place]) 06/04/2012   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[place]) 01/23/2010    Current Outpatient Medications:    acetaminophen (TYLENOL) 160 MG/5ML liquid, Take 325 mg by mouth as needed for fever., Disp: , Rfl:    allopurinol (ZYLOPRIM) 100 MG tablet, Take 2 tablets (200 mg total) by mouth daily., Disp: 60 tablet,  Rfl: 5   apixaban (ELIQUIS) 5 MG TABS tablet, Take 1 tablet (5 mg total) by mouth 2 (two) times daily., Disp: 180 tablet, Rfl: 3   BUDESONIDE PO, Take 9 mg by mouth daily., Disp: , Rfl:    Calcium 500 MG tablet, Take 1 tablet (500 mg total) by mouth 1 day or 1 dose for 1 dose., Disp: 30 tablet, Rfl:    Cholecalciferol (VITAMIN D) 125 MCG (5000 UT) CAPS, Take 5,000 Units by mouth daily. , Disp: , Rfl:    dapagliflozin propanediol (FARXIGA) 10 MG TABS tablet, Take 1 tablet (10 mg total) by mouth daily before breakfast., Disp: 90 tablet, Rfl: 3   divalproex (DEPAKOTE ER) 500 MG 24 hr tablet, Take 1,500 mg by mouth at bedtime. , Disp: , Rfl:    epoetin alfa (EPOGEN) 10000 UNIT/ML injection, Inject 10,000 Units into the skin every 30 (thirty) days., Disp: , Rfl:    ferrous sulfate 325 (65 FE) MG tablet, Take 325 mg by mouth in the morning and at bedtime., Disp: , Rfl:    furosemide (LASIX) 20 MG tablet, Take 1 tablet (20 mg total) by mouth every Monday, Wednesday, and Friday., Disp: 39 tablet, Rfl: 3   hydrALAZINE (APRESOLINE) 25 MG tablet, Take 25 mg by mouth 3 (three) times daily., Disp: , Rfl:    inFLIXimab (REMICADE) 100 MG injection, Infuse Remicade IV schedule 1 64m/kg every 8 weeks Premedicate with Tylenol 500-6566mby mouth and Benadryl 25-5068my mouth prior to infusion. Last PPD was on 12/2009. , Disp: 1 each, Rfl: 6   isosorbide mononitrate (IMDUR) 60 MG 24 hr  tablet, Take 1 tablet (60 mg total) by mouth daily., Disp: 90 tablet, Rfl: 3   ivabradine (CORLANOR) 5 MG TABS tablet, TAKE 1 TABLET(5 MG) BY MOUTH TWICE DAILY WITH A MEAL (Patient taking differently: Take 5 mg by mouth 2 (two) times daily with a meal.), Disp: 180 tablet, Rfl: 3   loperamide (IMODIUM) 2 MG capsule, Take 2 mg by mouth 3 (three) times daily as needed., Disp: , Rfl:    Magnesium 400 MG TABS, Take 400 mg by mouth daily., Disp: 30 tablet, Rfl: 1   metoprolol succinate (TOPROL-XL) 25 MG 24 hr tablet, Take 1 tablet (25 mg total)  by mouth in the morning and at bedtime., Disp: 180 tablet, Rfl: 6   mirtazapine (REMERON) 7.5 MG tablet, TAKE 1 TABLET(7.5 MG) BY MOUTH AT BEDTIME, Disp: 30 tablet, Rfl: 11   NON FORMULARY, Mucositis mouth wash- uses as needed, Disp: , Rfl:    omeprazole (PRILOSEC) 20 MG capsule, Take 20 mg by mouth daily., Disp: , Rfl:    QUEtiapine (SEROQUEL) 100 MG tablet, Take 150 mg by mouth at bedtime., Disp: , Rfl:    solifenacin (VESICARE) 5 MG tablet, Take 5 mg by mouth daily., Disp: , Rfl:    tamsulosin (FLOMAX) 0.4 MG CAPS capsule, Take 1 capsule (0.4 mg total) by mouth daily., Disp: 30 capsule, Rfl: 2   vitamin B-12 (CYANOCOBALAMIN) 500 MCG tablet, Take 1,000 mcg by mouth daily. , Disp: , Rfl:  Allergies  Allergen Reactions   Azathioprine Other (See Comments)    REACTION: affected WBC "Almost died"   Ciprofloxacin Other (See Comments)    Reaction not recalled   Levaquin [Levofloxacin In D5w] Other (See Comments)    Reaction not rec   Plendil [Felodipine] Other (See Comments)    Reaction not not recalled     Social History   Socioeconomic History   Marital status: Divorced    Spouse name: Not on file   Number of children: 1   Years of education: 12   Highest education level: Not on file  Occupational History   Occupation: retired   Occupation: Veteran  Tobacco Use   Smoking status: Former    Packs/day: 1.00    Years: 49.00    Pack years: 49.00    Types: Cigarettes    Start date: 04/11/1956    Quit date: 04/17/2014    Years since quitting: 6.4   Smokeless tobacco: Never  Vaping Use   Vaping Use: Never used  Substance and Sexual Activity   Alcohol use: No    Alcohol/week: 0.0 standard drinks   Drug use: No   Sexual activity: Never  Other Topics Concern   Not on file  Social History Narrative   Not on file   Social Determinants of Health   Financial Resource Strain: Not on file  Food Insecurity: Not on file  Transportation Needs: Not on file  Physical Activity: Not on  file  Stress: Not on file  Social Connections: Not on file  Intimate Partner Violence: Not on file    Physical Exam Vitals reviewed.  Constitutional:      Appearance: Normal appearance. He is normal weight.  HENT:     Head: Normocephalic.     Nose: Nose normal.     Mouth/Throat:     Mouth: Mucous membranes are moist.     Pharynx: Oropharynx is clear.  Eyes:     Conjunctiva/sclera: Conjunctivae normal.     Pupils: Pupils are equal, round, and reactive to  light.  Cardiovascular:     Rate and Rhythm: Normal rate and regular rhythm.     Pulses: Normal pulses.     Heart sounds: Normal heart sounds.  Pulmonary:     Effort: Pulmonary effort is normal.     Breath sounds: Normal breath sounds.  Abdominal:     General: Abdomen is flat.     Palpations: Abdomen is soft.     Comments: Diarrhea.  Musculoskeletal:        General: No swelling. Normal range of motion.     Cervical back: Normal range of motion.     Right lower leg: No edema.     Left lower leg: No edema.  Skin:    General: Skin is warm and dry.     Capillary Refill: Capillary refill takes less than 2 seconds.  Neurological:     General: No focal deficit present.     Mental Status: He is alert. Mental status is at baseline.     Motor: Weakness present.     Gait: Gait abnormal.  Psychiatric:        Mood and Affect: Mood normal.   Arrived for home visit for Thelma where he was seated in his recliner in his apartment reporting to not be feeling well. Decklan states that since starting Amoxicillin last week after his teeth extraction he began having extreme diarrhea and weakness with difficulty walking. He states he fell over the weekend but obtained no injuries. Jeneen Rinks agreed for [name] to call his PCP office with the [facility] to make them aware of this. WE reached out to the triage nurse [place] at the [facility] office and she got him scheduled for a visit tomorrow at 1:00. He has a family member who will be driving him there.    I assessed [name] and obtained vitals as noted. We reviewed medications. He reports the diarrhea has stopped for now since he has not had any of the Amoxicillin.   I filled one pill box for [name]. He was given his morning medications.   He states that he is having trouble remembering to take his medications, to eat and to pay his bills. He reports he will discuss with PCP tomorrow to see what resources the [place] can assist him with, I will follow this closely upon our weekly visits.   Mr. [name] pharmacy visit was rescheduled for next week rather than tomorrow. I will follow up.   Yerachmiel reports feeling safe at home today, he was able to show me he could walk to the bathroom and bedroom and back with no falls or needs for his walker.    Home visit complete.   Refills: Mirtazapine Seroquel   Future Appointments  Date Time Provider [place]  09/27/2020 11:00 AM [facility] PHARMACY [facility] None  09/28/2020 12:20 PM [facility] CT 1 [facility]  10/02/2020  7:00 AM [facility] DEVICE REMOTES [facility]  10/15/2020 11:00 AM [facility] RM 2 [facility] None  10/30/2020  1:00 PM Wardell Honour, MD [facility] None  01/01/2021  7:00 AM [facility] DEVICE REMOTES [facility]  01/29/2021  1:00 PM [name], Dinah C, NP [facility] None  04/02/2021  7:00 AM [facility] DEVICE REMOTES [facility]  07/02/2021  7:00 AM [facility] DEVICE REMOTES [facility]     ACTION: Home visit completed

## 2020-09-27 ENCOUNTER — Inpatient Hospital Stay (HOSPITAL_COMMUNITY)
Admission: RE | Admit: 2020-09-27 | Discharge: 2020-09-27 | Disposition: A | Discharge status: Home / Self Care | Payer: No Typology Code available for payment source | Source: Ambulatory Visit

## 2020-09-28 ENCOUNTER — Other Ambulatory Visit: Payer: No Typology Code available for payment source

## 2020-10-02 ENCOUNTER — Ambulatory Visit (INDEPENDENT_AMBULATORY_CARE_PROVIDER_SITE_OTHER): Payer: No Typology Code available for payment source

## 2020-10-02 DIAGNOSIS — I5042 Chronic combined systolic (congestive) and diastolic (congestive) heart failure: Secondary | ICD-10-CM

## 2020-10-02 LAB — CUP PACEART REMOTE DEVICE CHECK
Battery Remaining Longevity: 133 mo
Battery Voltage: 3.04 V
Brady Statistic RV Percent Paced: 0 %
Date Time Interrogation Session: 20220809012506
HighPow Impedance: 78 Ohm
Implantable Lead Implant Date: 20211108
Implantable Lead Location: 753860
Implantable Pulse Generator Implant Date: 20211108
Lead Channel Impedance Value: 304 Ohm
Lead Channel Impedance Value: 361 Ohm
Lead Channel Pacing Threshold Amplitude: 1.125 V
Lead Channel Pacing Threshold Pulse Width: 0.4 ms
Lead Channel Sensing Intrinsic Amplitude: 16.5 mV
Lead Channel Sensing Intrinsic Amplitude: 16.5 mV
Lead Channel Setting Pacing Amplitude: 2.25 V
Lead Channel Setting Pacing Pulse Width: 0.4 ms
Lead Channel Setting Sensing Sensitivity: 0.3 mV

## 2020-10-03 ENCOUNTER — Other Ambulatory Visit (HOSPITAL_COMMUNITY): Payer: Self-pay

## 2020-10-03 NOTE — Progress Notes (Signed)
Paramedicine Encounter    Patient ID: WACO FOERSTER, male    DOB: May 05, 1945, 75 y.o.   MRN: [id number]   Patient Care Team: Wardell Honour, MD as PCP - General (Family Medicine) Martinique, Peter M, MD as PCP - Cardiology (Cardiology) Larey Dresser, MD as PCP - Advanced Heart Failure (Cardiology) Elmarie Shiley, MD (Nephrology) Milus Banister, MD (Gastroenterology) Jorge Ny, LCSW as Social Worker (Licensed Clinical Social Worker)  Patient Active Problem List   Diagnosis Date Noted   Electrolyte abnormality 05/17/2020   ICD (implantable cardioverter-defibrillator) in place 40/98/1191   Chronic systolic heart failure ([place]) 01/02/2020   Decreased appetite 10/08/2019   Weakness 10/08/2019   Hyperkalemia 47/82/9562   Chronic systolic CHF (congestive heart failure) ([place]) 08/26/2019   Type 2 diabetes mellitus with stage 3 chronic kidney disease ([place]) 08/26/2019   Chronic obstructive pulmonary disease ([place]) 06/20/2019   Ventricular tachycardia ([place]) 06/20/2019   Acute on chronic systolic (congestive) heart failure ([place]) 13/09/6576   Metabolic acidosis 46/96/2952   Orthostasis 09/07/2018   AKI (acute kidney injury) ([place])    Immunosuppressed status ([place])    Macrocytic anemia    Chronic combined systolic and diastolic congestive heart failure ([place]) 06/03/2018   Candida esophagitis ([place]) 09/22/2017   History of smoking 30 or more pack years 01/05/2017   Bipolar 1 disorder ([place])    BPH (benign prostatic hyperplasia) 03/31/2013   Anemia in chronic kidney disease 03/31/2013   Essential hypertension, benign 03/31/2013   Acute renal failure superimposed on stage 3 chronic kidney disease ([place]) 06/04/2012   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[place]) 01/23/2010    Current Outpatient Medications:    acetaminophen (TYLENOL) 160 MG/5ML liquid, Take 325 mg by mouth as needed for fever., Disp: , Rfl:    allopurinol (ZYLOPRIM) 100 MG tablet, Take 2 tablets (200 mg total) by mouth daily. (Patient not  taking: Reported on 09/26/2020), Disp: 60 tablet, Rfl: 5   apixaban (ELIQUIS) 5 MG TABS tablet, Take 1 tablet (5 mg total) by mouth 2 (two) times daily., Disp: 180 tablet, Rfl: 3   BUDESONIDE PO, Take 9 mg by mouth daily., Disp: , Rfl:    Calcium 500 MG tablet, Take 1 tablet (500 mg total) by mouth 1 day or 1 dose for 1 dose., Disp: 30 tablet, Rfl:    Cholecalciferol (VITAMIN D) 125 MCG (5000 UT) CAPS, Take 5,000 Units by mouth daily. , Disp: , Rfl:    dapagliflozin propanediol (FARXIGA) 10 MG TABS tablet, Take 1 tablet (10 mg total) by mouth daily before breakfast., Disp: 90 tablet, Rfl: 3   divalproex (DEPAKOTE ER) 500 MG 24 hr tablet, Take 1,500 mg by mouth at bedtime. , Disp: , Rfl:    epoetin alfa (EPOGEN) 10000 UNIT/ML injection, Inject 10,000 Units into the skin every 30 (thirty) days., Disp: , Rfl:    ferrous sulfate 325 (65 FE) MG tablet, Take 325 mg by mouth in the morning and at bedtime., Disp: , Rfl:    furosemide (LASIX) 20 MG tablet, Take 1 tablet (20 mg total) by mouth every Monday, Wednesday, and Friday., Disp: 39 tablet, Rfl: 3   hydrALAZINE (APRESOLINE) 25 MG tablet, Take 25 mg by mouth 3 (three) times daily. (Patient not taking: Reported on 09/26/2020), Disp: , Rfl:    inFLIXimab (REMICADE) 100 MG injection, Infuse Remicade IV schedule 1 66m/kg every 8 weeks Premedicate with Tylenol 500-6553mby mouth and Benadryl 25-5043my mouth prior to infusion. Last PPD was on 12/2009. , Disp: 1  each, Rfl: 6   isosorbide mononitrate (IMDUR) 60 MG 24 hr tablet, Take 1 tablet (60 mg total) by mouth daily., Disp: 90 tablet, Rfl: 3   ivabradine (CORLANOR) 5 MG TABS tablet, TAKE 1 TABLET(5 MG) BY MOUTH TWICE DAILY WITH A MEAL (Patient taking differently: Take 5 mg by mouth 2 (two) times daily with a meal.), Disp: 180 tablet, Rfl: 3   loperamide (IMODIUM) 2 MG capsule, Take 2 mg by mouth 3 (three) times daily as needed., Disp: , Rfl:    Magnesium 400 MG TABS, Take 400 mg by mouth daily., Disp: 30 tablet,  Rfl: 1   metoprolol succinate (TOPROL-XL) 25 MG 24 hr tablet, Take 1 tablet (25 mg total) by mouth in the morning and at bedtime., Disp: 180 tablet, Rfl: 6   mirtazapine (REMERON) 7.5 MG tablet, TAKE 1 TABLET(7.5 MG) BY MOUTH AT BEDTIME, Disp: 30 tablet, Rfl: 11   NON FORMULARY, Mucositis mouth wash- uses as needed, Disp: , Rfl:    omeprazole (PRILOSEC) 20 MG capsule, Take 20 mg by mouth daily., Disp: , Rfl:    QUEtiapine (SEROQUEL) 100 MG tablet, Take 150 mg by mouth at bedtime., Disp: , Rfl:    solifenacin (VESICARE) 5 MG tablet, Take 5 mg by mouth daily., Disp: , Rfl:    tamsulosin (FLOMAX) 0.4 MG CAPS capsule, Take 1 capsule (0.4 mg total) by mouth daily., Disp: 30 capsule, Rfl: 2   vitamin B-12 (CYANOCOBALAMIN) 500 MCG tablet, Take 1,000 mcg by mouth daily. , Disp: , Rfl:  Allergies  Allergen Reactions   Azathioprine Other (See Comments)    REACTION: affected WBC "Almost died"   Ciprofloxacin Other (See Comments)    Reaction not recalled   Levaquin [Levofloxacin In D5w] Other (See Comments)    Reaction not rec   Plendil [Felodipine] Other (See Comments)    Reaction not not recalled     Social History   Socioeconomic History   Marital status: Divorced    Spouse name: Not on file   Number of children: 1   Years of education: 12   Highest education level: Not on file  Occupational History   Occupation: retired   Occupation: Veteran  Tobacco Use   Smoking status: Former    Packs/day: 1.00    Years: 49.00    Pack years: 49.00    Types: Cigarettes    Start date: 04/11/1956    Quit date: 04/17/2014    Years since quitting: 6.4   Smokeless tobacco: Never  Vaping Use   Vaping Use: Never used  Substance and Sexual Activity   Alcohol use: No    Alcohol/week: 0.0 standard drinks   Drug use: No   Sexual activity: Never  Other Topics Concern   Not on file  Social History Narrative   Not on file   Social Determinants of Health   Financial Resource Strain: Not on file   Food Insecurity: Not on file  Transportation Needs: Not on file  Physical Activity: Not on file  Stress: Not on file  Social Connections: Not on file  Intimate Partner Violence: Not on file    Physical Exam Vitals reviewed.  Constitutional:      Appearance: Normal appearance. He is normal weight.  HENT:     Head: Normocephalic.     Nose: Nose normal.     Mouth/Throat:     Mouth: Mucous membranes are moist.     Pharynx: Oropharynx is clear.  Eyes:     Conjunctiva/sclera: Conjunctivae normal.  Pupils: Pupils are equal, round, and reactive to light.  Cardiovascular:     Rate and Rhythm: Normal rate and regular rhythm.     Pulses: Normal pulses.     Heart sounds: Normal heart sounds.  Pulmonary:     Effort: Pulmonary effort is normal.     Breath sounds: Normal breath sounds.  Abdominal:     General: Abdomen is flat.     Palpations: Abdomen is soft.  Musculoskeletal:        General: No swelling. Normal range of motion.     Cervical back: Normal range of motion.     Right lower leg: No edema.     Left lower leg: No edema.  Skin:    General: Skin is warm and dry.     Capillary Refill: Capillary refill takes less than 2 seconds.  Neurological:     General: No focal deficit present.     Mental Status: He is alert. Mental status is at baseline.  Psychiatric:        Mood and Affect: Mood normal.        Thought Content: Thought content normal.    Arrived for home visit for Korbin who was seated in his recliner alert and oriented reporting to be feeling much better today. Caillou tested positive for COVID last week at his PCP appointment at the [facility]. Nichalas quarantined appropriately and reports he did not take any of his prescribed medications over the last week due to feeling so sick. He stated he had diarrhea,weakness, fever, chills and body aches but currently is symptom free. Vitals were obtained and are as noted. Assessment as noted, lung sounds clear. Pill box full from last  visit where I filled it. I gave [name] his medications for today. He reports he will be better about his medications this week. Sanford was reminded of his upcoming Pharmacy visit with Audry Riles next week at 11:00. I will meet him there rather than our home visit for next week, he agreed with plan and this was written in his planner. Shalin denied dizziness, chest pain or shortness of breath. No swelling noted. Maurico walked to his bedroom to weigh with no notes of dizziness or balance issues. Home visit complete I will see Ares in one week.   Refills: NONE    Future Appointments  Date Time Provider [place]  10/10/2020 11:00 AM [facility] PHARMACY [facility] None  10/15/2020 11:00 AM [facility] RM 2 [facility] None  10/30/2020  1:00 PM Wardell Honour, MD [facility] None  01/01/2021  7:00 AM [facility] DEVICE REMOTES [facility]  01/29/2021  1:00 PM [name], Dinah C, NP [facility] None  04/02/2021  7:00 AM [facility] DEVICE REMOTES [facility]  07/02/2021  7:00 AM [facility] DEVICE REMOTES [facility]     ACTION: Home visit completed

## 2020-10-08 ENCOUNTER — Inpatient Hospital Stay (HOSPITAL_COMMUNITY)
Admission: EM | Admit: 2020-10-08 | Discharge: 2020-10-15 | DRG: 641 | Disposition: A | Discharge status: Skilled Nursing Facility | Payer: No Typology Code available for payment source | Attending: Internal Medicine | Admitting: Internal Medicine

## 2020-10-08 ENCOUNTER — Other Ambulatory Visit: Payer: Self-pay

## 2020-10-08 ENCOUNTER — Encounter (HOSPITAL_COMMUNITY): Payer: Self-pay | Admitting: Family Medicine

## 2020-10-08 ENCOUNTER — Emergency Department (HOSPITAL_COMMUNITY): Payer: No Typology Code available for payment source

## 2020-10-08 DIAGNOSIS — I10 Essential (primary) hypertension: Secondary | ICD-10-CM | POA: Diagnosis present

## 2020-10-08 DIAGNOSIS — N1832 Chronic kidney disease, stage 3b: Secondary | ICD-10-CM | POA: Diagnosis present

## 2020-10-08 DIAGNOSIS — F319 Bipolar disorder, unspecified: Secondary | ICD-10-CM | POA: Diagnosis present

## 2020-10-08 DIAGNOSIS — D696 Thrombocytopenia, unspecified: Secondary | ICD-10-CM | POA: Diagnosis present

## 2020-10-08 DIAGNOSIS — K5090 Crohn's disease, unspecified, without complications: Secondary | ICD-10-CM | POA: Diagnosis present

## 2020-10-08 DIAGNOSIS — Z8049 Family history of malignant neoplasm of other genital organs: Secondary | ICD-10-CM | POA: Diagnosis not present

## 2020-10-08 DIAGNOSIS — Z87891 Personal history of nicotine dependence: Secondary | ICD-10-CM

## 2020-10-08 DIAGNOSIS — N400 Enlarged prostate without lower urinary tract symptoms: Secondary | ICD-10-CM | POA: Diagnosis present

## 2020-10-08 DIAGNOSIS — J8410 Pulmonary fibrosis, unspecified: Secondary | ICD-10-CM | POA: Diagnosis present

## 2020-10-08 DIAGNOSIS — Z881 Allergy status to other antibiotic agents status: Secondary | ICD-10-CM

## 2020-10-08 DIAGNOSIS — K900 Celiac disease: Secondary | ICD-10-CM | POA: Diagnosis present

## 2020-10-08 DIAGNOSIS — I5022 Chronic systolic (congestive) heart failure: Secondary | ICD-10-CM | POA: Diagnosis present

## 2020-10-08 DIAGNOSIS — Z8616 Personal history of COVID-19: Secondary | ICD-10-CM | POA: Diagnosis not present

## 2020-10-08 DIAGNOSIS — J449 Chronic obstructive pulmonary disease, unspecified: Secondary | ICD-10-CM | POA: Diagnosis not present

## 2020-10-08 DIAGNOSIS — Z20822 Contact with and (suspected) exposure to covid-19: Secondary | ICD-10-CM | POA: Diagnosis present

## 2020-10-08 DIAGNOSIS — Z833 Family history of diabetes mellitus: Secondary | ICD-10-CM | POA: Diagnosis not present

## 2020-10-08 DIAGNOSIS — Z825 Family history of asthma and other chronic lower respiratory diseases: Secondary | ICD-10-CM

## 2020-10-08 DIAGNOSIS — I130 Hypertensive heart and chronic kidney disease with heart failure and stage 1 through stage 4 chronic kidney disease, or unspecified chronic kidney disease: Secondary | ICD-10-CM | POA: Diagnosis present

## 2020-10-08 DIAGNOSIS — Z7901 Long term (current) use of anticoagulants: Secondary | ICD-10-CM

## 2020-10-08 DIAGNOSIS — D631 Anemia in chronic kidney disease: Secondary | ICD-10-CM | POA: Diagnosis present

## 2020-10-08 DIAGNOSIS — Z95810 Presence of automatic (implantable) cardiac defibrillator: Secondary | ICD-10-CM | POA: Diagnosis not present

## 2020-10-08 DIAGNOSIS — G4730 Sleep apnea, unspecified: Secondary | ICD-10-CM | POA: Diagnosis present

## 2020-10-08 DIAGNOSIS — E440 Moderate protein-calorie malnutrition: Secondary | ICD-10-CM | POA: Diagnosis not present

## 2020-10-08 DIAGNOSIS — M6281 Muscle weakness (generalized): Secondary | ICD-10-CM | POA: Diagnosis not present

## 2020-10-08 DIAGNOSIS — R531 Weakness: Secondary | ICD-10-CM | POA: Diagnosis present

## 2020-10-08 DIAGNOSIS — E876 Hypokalemia: Secondary | ICD-10-CM | POA: Diagnosis not present

## 2020-10-08 DIAGNOSIS — U071 COVID-19: Secondary | ICD-10-CM

## 2020-10-08 DIAGNOSIS — R2681 Unsteadiness on feet: Secondary | ICD-10-CM | POA: Diagnosis not present

## 2020-10-08 DIAGNOSIS — R1312 Dysphagia, oropharyngeal phase: Secondary | ICD-10-CM | POA: Diagnosis not present

## 2020-10-08 DIAGNOSIS — N183 Chronic kidney disease, stage 3 unspecified: Secondary | ICD-10-CM

## 2020-10-08 DIAGNOSIS — E1122 Type 2 diabetes mellitus with diabetic chronic kidney disease: Secondary | ICD-10-CM | POA: Diagnosis not present

## 2020-10-08 DIAGNOSIS — N189 Chronic kidney disease, unspecified: Secondary | ICD-10-CM | POA: Diagnosis present

## 2020-10-08 DIAGNOSIS — N3281 Overactive bladder: Secondary | ICD-10-CM | POA: Diagnosis present

## 2020-10-08 DIAGNOSIS — R5383 Other fatigue: Secondary | ICD-10-CM | POA: Diagnosis not present

## 2020-10-08 DIAGNOSIS — Z7401 Bed confinement status: Secondary | ICD-10-CM | POA: Diagnosis not present

## 2020-10-08 DIAGNOSIS — M2550 Pain in unspecified joint: Secondary | ICD-10-CM | POA: Diagnosis not present

## 2020-10-08 DIAGNOSIS — M109 Gout, unspecified: Secondary | ICD-10-CM | POA: Diagnosis present

## 2020-10-08 DIAGNOSIS — N179 Acute kidney failure, unspecified: Secondary | ICD-10-CM

## 2020-10-08 LAB — COMPREHENSIVE METABOLIC PANEL
ALT: 10 U/L (ref 0–44)
AST: 13 U/L — ABNORMAL LOW (ref 15–41)
Albumin: 2.7 g/dL — ABNORMAL LOW (ref 3.5–5.0)
Alkaline Phosphatase: 67 U/L (ref 38–126)
Anion gap: 9 (ref 5–15)
BUN: 29 mg/dL — ABNORMAL HIGH (ref 8–23)
CO2: 25 mmol/L (ref 22–32)
Calcium: 8.5 mg/dL — ABNORMAL LOW (ref 8.9–10.3)
Chloride: 108 mmol/L (ref 98–111)
Creatinine, Ser: 2.24 mg/dL — ABNORMAL HIGH (ref 0.61–1.24)
GFR, Estimated: 30 mL/min — ABNORMAL LOW (ref >60)
Glucose, Bld: 114 mg/dL — ABNORMAL HIGH (ref 70–99)
Potassium: 2.7 mmol/L — CL (ref 3.5–5.1)
Sodium: 142 mmol/L (ref 135–145)
Total Bilirubin: 0.5 mg/dL (ref 0.3–1.2)
Total Protein: 6.9 g/dL (ref 6.5–8.1)

## 2020-10-08 LAB — CBC WITH DIFFERENTIAL/PLATELET
Abs Immature Granulocytes: 0.07 10*3/uL (ref 0.00–0.07)
Basophils Absolute: 0 10*3/uL (ref 0.0–0.1)
Basophils Relative: 0 %
Eosinophils Absolute: 0.1 10*3/uL (ref 0.0–0.5)
Eosinophils Relative: 1 %
HCT: 37.1 % — ABNORMAL LOW (ref 39.0–52.0)
Hemoglobin: 11.6 g/dL — ABNORMAL LOW (ref 13.0–17.0)
Immature Granulocytes: 1 %
Lymphocytes Relative: 22 %
Lymphs Abs: 2.7 10*3/uL (ref 0.7–4.0)
MCH: 33.2 pg (ref 26.0–34.0)
MCHC: 31.3 g/dL (ref 30.0–36.0)
MCV: 106.3 fL — ABNORMAL HIGH (ref 80.0–100.0)
Monocytes Absolute: 0.7 10*3/uL (ref 0.1–1.0)
Monocytes Relative: 6 %
Neutro Abs: 8.5 10*3/uL — ABNORMAL HIGH (ref 1.7–7.7)
Neutrophils Relative %: 70 %
Platelets: 222 10*3/uL (ref 150–400)
RBC: 3.49 MIL/uL — ABNORMAL LOW (ref 4.22–5.81)
RDW: 13 % (ref 11.5–15.5)
WBC: 12 10*3/uL — ABNORMAL HIGH (ref 4.0–10.5)
nRBC: 0 % (ref 0.0–0.2)

## 2020-10-08 LAB — RESP PANEL BY RT-PCR (FLU A&B, COVID) ARPGX2
Influenza A by PCR: NEGATIVE
Influenza B by PCR: NEGATIVE
SARS Coronavirus 2 by RT PCR: NEGATIVE

## 2020-10-08 LAB — MAGNESIUM: Magnesium: 1.3 mg/dL — ABNORMAL LOW (ref 1.7–2.4)

## 2020-10-08 MED ORDER — POTASSIUM CHLORIDE 10 MEQ/100ML IV SOLN
10.0000 meq | INTRAVENOUS | Status: AC
Start: 2020-10-08 — End: 2020-10-09
  Administered 2020-10-08 – 2020-10-09 (×3): 10 meq via INTRAVENOUS
  Filled 2020-10-08 (×3): qty 100

## 2020-10-08 MED ORDER — POTASSIUM CHLORIDE CRYS ER 20 MEQ PO TBCR
60.0000 meq | EXTENDED_RELEASE_TABLET | Freq: Once | ORAL | Status: AC
  Administered 2020-10-08: 60 meq via ORAL
  Filled 2020-10-08: qty 3

## 2020-10-08 MED ORDER — MAGNESIUM SULFATE 2 GM/50ML IV SOLN
2.0000 g | Freq: Once | INTRAVENOUS | Status: AC
  Administered 2020-10-08: 2 g via INTRAVENOUS
  Filled 2020-10-08: qty 50

## 2020-10-08 NOTE — ED Triage Notes (Signed)
Pt reports going to the [place] for eval of generalized weakness, inability to get up, etc. Blood work was done which shows low potassium (2.8) and low calcium levels and he was sent here. Endorses decreased appetite and diarrhea.

## 2020-10-08 NOTE — H&P (Signed)
History and Physical    PARK BECK VHQ:[id number] DOB: November 30, 1945 DOA: 10/08/2020  PCP: Wardell Honour, MD   Patient coming from: Home  Chief Complaint: Weakness, diarrhea  HPI: Brent [name] is a 75 y.o. male with medical history significant for HFrEF, HTN, BPH, Crohn's disease, CKD 3 B, on eliquis and has ICD who presents from home via the [place] [facility] for low potassium level. He reports he was diagnosised with Covid 10 days ago. States he had rhinorrhea and decreased smell and taste but respiratory problems. He developed diarrhea 4-5 days ago. Has had multiple bouts daily of watery nonbloody diarrhea. He states he has been very weak the past 2 days and is not able to get up if he sits or lays down. He has not had syncopal events. He spent a few hours sitting on the ground in his yard when he was outside and came very weak and sat down.  A passerby saw him and helped him back into his house.  Has had people come by and check on him at home and they have had to help him get up out of a chair or out of bed as he was not able to do it on his own.  As his weakness did not improve he decided to go to the [place] [facility] this morning and there he was told his potassium and calcium were low so he was sent to the emergency room.  He states he has not had any chest pain, palpitations, shortness of breath, cough, abdominal pain, nausea or vomiting, urinary symptoms or rash.  He lives alone.  Has a history of smoking but quit in 2016.  Denies alcohol or illicit drug use  ED Course: Brent [name] has been hemodynamically stable while in the emergency room.  He has been afebrile.  He is found to have a low magnesium of 1.3 and a low potassium of 2.7.  He was not able to stand up on his own or ambulate.  X-ray was negative for infiltrate or consolidations.  He does have an ICD in the left upper chest.  WBC 12,000 hemoglobin 11.6 hematocrit 7.1 platelets 222,000, sodium 142 potassium 2.7 magnesium 1.3 creatinine 2.24 BUN 29  chloride 108 bicarb 25 glucose 114 alkaline phosphatase 67 AST 13 ALT 10.  Was given IV magnesium in the emergency room.  He was given oral potassium prior to magnesium in the emergency room but unfortunately this will probably not raise his potassium with his magnesium level not supplemented first.  Will provide IV potassium supplementation.  Recheck electrolytes and renal function with labs in morning.  Hospitalist asked to evaluate and admit patient for further management as he is not able to ambulate or be sent home safely.  Review of Systems:  General: Reports generalized weakness. Denies fever, chills, weight loss, night sweats. Denies dizziness. Denies change in appetite HENT: Denies head trauma, headache, denies change in hearing, tinnitus. Denies nasal congestion. Denies sore throat.  Denies difficulty swallowing Eyes: Denies blurry vision, pain in eye, drainage.  Denies discoloration of eyes. Neck: Denies pain.  Denies swelling.  Denies pain with movement. Cardiovascular: Denies chest pain, palpitations.  Denies edema.  Denies orthopnea Respiratory: Denies shortness of breath, cough.  Denies wheezing.  Denies sputum production Gastrointestinal: Denies abdominal pain, swelling.  Denies nausea, vomiting, diarrhea.  Denies melena.  Denies hematemesis. Musculoskeletal: Denies limitation of movement.  Denies deformity or swelling.  Denies pain.  Denies arthralgias or myalgias. Genitourinary: Denies pelvic pain.  Denies urinary frequency or hesitancy.  Denies dysuria.  Skin: Denies rash.  Denies petechiae, purpura, ecchymosis. Neurological: Denies syncope. Denies seizure activity. Denies paresthesia. Denies slurred speech, drooping face. Denies visual change. Psychiatric: Denies depression, anxiety. Denies hallucinations.  Past Medical History:  Diagnosis Date   Anemia    Anxiety    Benign paroxysmal positional vertigo    Bipolar I disorder, most recent episode (or current) unspecified     Celiac disease    CHF (congestive heart failure) (HCC)    Chronic kidney disease, stage III (moderate) (HCC)    Crohn's    Remicade q8 weeks   Depression    Essential and other specified forms of tremor    medication-induced Parkinson's, now resolved   Essential hypertension, benign    Gout 2018   Heart failure ([place])    Hypertrophy of prostate without urinary obstruction and other lower urinary tract symptoms (LUTS)    Impotence of organic origin    Insomnia with sleep apnea, unspecified    Iron deficiency anemia, unspecified    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   Neuralgia, neuritis, and radiculitis, unspecified    Other B-complex deficiencies    Other extrapyramidal disease and abnormal movement disorder    Postinflammatory pulmonary fibrosis ([place])    Tobacco use disorder    Vertigo 2018    Past Surgical History:  Procedure Laterality Date   CATARACT EXTRACTION, BILATERAL Bilateral 09/2018   CHOLECYSTECTOMY  07-12-2010   ICD IMPLANT N/A 01/02/2020   Procedure: ICD IMPLANT;  Surgeon: Evans Lance, MD;  Location: [facility];  Service: Cardiovascular;  Laterality: N/A;   RIGHT HEART CATH N/A 10/12/2018   Procedure: RIGHT HEART CATH;  Surgeon: Jolaine Artist, MD;  Location: [facility];  Service: Cardiovascular;  Laterality: N/A;   RIGHT/LEFT HEART CATH AND CORONARY ANGIOGRAPHY N/A 04/26/2019   Procedure: RIGHT/LEFT HEART CATH AND CORONARY ANGIOGRAPHY;  Surgeon: Larey Dresser, MD;  Location: [facility];  Service: Cardiovascular;  Laterality: N/A;   SMALL INTESTINE SURGERY     x 2    Social History  reports that he quit smoking about 6 years ago. His smoking use included cigarettes. He started smoking about 64 years ago. He has a 49.00 pack-year smoking history. He has never used smokeless tobacco. He reports that he does not drink alcohol and does not use drugs.  Allergies  Allergen Reactions   Azathioprine Other (See Comments)    REACTION: affected WBC  "Almost died"   Ciprofloxacin Other (See Comments)    Reaction not recalled   Levaquin [Levofloxacin In D5w] Other (See Comments)    Reaction not rec   Plendil [Felodipine] Other (See Comments)    Reaction not not recalled    Family History  Problem Relation Age of Onset   Diabetes Mother        maternal grandmother   Uterine cancer Mother    Emphysema Father    Pneumonia Maternal Grandmother    Colon cancer Neg Hx      Prior to Admission medications   Medication Sig Start Date End Date Taking? Authorizing Provider  apixaban (ELIQUIS) 5 MG TABS tablet Take 1 tablet (5 mg total) by mouth 2 (two) times daily. 05/25/20 10/08/20 Yes Larey Dresser, MD  BUDESONIDE PO Take 9 mg by mouth daily.   Yes [provider]  Calcium 500 MG tablet Take 1 tablet (500 mg total) by mouth 1 day or 1 dose for 1 dose. 05/19/20 12/01/20  Yes Geradine Girt, DO  Cholecalciferol (VITAMIN D) 125 MCG (5000 UT) CAPS Take 5,000 Units by mouth daily.    Yes [provider]  dapagliflozin propanediol (FARXIGA) 10 MG TABS tablet Take 1 tablet (10 mg total) by mouth daily before breakfast. 08/28/20  Yes Larey Dresser, MD  divalproex (DEPAKOTE ER) 500 MG 24 hr tablet Take 1,500 mg by mouth at bedtime.    Yes [provider]  epoetin alfa (EPOGEN) 10000 UNIT/ML injection Inject 10,000 Units into the skin every 30 (thirty) days.   Yes [provider]  ferrous sulfate 325 (65 FE) MG tablet Take 325 mg by mouth in the morning and at bedtime.   Yes [provider]  furosemide (LASIX) 20 MG tablet Take 1 tablet (20 mg total) by mouth every Monday, Wednesday, and Friday. 06/20/20  Yes Bensimhon, Shaune Pascal, MD  hydrALAZINE (APRESOLINE) 25 MG tablet Take 25 mg by mouth 3 (three) times daily as needed (High BP, SBP>130).   Yes [provider]  inFLIXimab (REMICADE) 100 MG injection Infuse Remicade IV schedule 1 17m/kg every 8 weeks Premedicate with Tylenol 500-6560mby mouth  and Benadryl 25-5053my mouth prior to infusion. Last PPD was on 12/2009.  11/22/10  Yes JacMilus BanisterD  isosorbide mononitrate (IMDUR) 60 MG 24 hr tablet Take 1 tablet (60 mg total) by mouth daily. 03/28/20  Yes Bensimhon, DanShaune PascalD  Magnesium 400 MG TABS Take 400 mg by mouth daily. 05/19/20  Yes VanEulogio Bear DO  metoprolol succinate (TOPROL-XL) 25 MG 24 hr tablet Take 1 tablet (25 mg total) by mouth in the morning and at bedtime. 03/28/20  Yes Bensimhon, DanShaune PascalD  mirtazapine (REMERON) 7.5 MG tablet TAKE 1 TABLET(7.5 MG) BY MOUTH AT BEDTIME Patient taking differently: Take 7.5 mg by mouth at bedtime. TAKE 1 TABLET(7.5 MG) BY MOUTH AT BEDTIME 08/17/20  Yes MilWardell HonourD  NON FORMULARY Mucositis mouth wash- uses as needed 08/21/20  Yes [provider]  omeprazole (PRILOSEC) 20 MG capsule Take 20 mg by mouth daily.   Yes [provider]  QUEtiapine (SEROQUEL) 100 MG tablet Take 150 mg by mouth at bedtime. 01/25/20  Yes [provider]  solifenacin (VESICARE) 5 MG tablet Take 5 mg by mouth daily.   Yes [provider]  tamsulosin (FLOMAX) 0.4 MG CAPS capsule Take 1 capsule (0.4 mg total) by mouth daily. 06/16/19  Yes Reed, Tiffany L, DO  vitamin B-12 (CYANOCOBALAMIN) 500 MCG tablet Take 1,000 mcg by mouth daily.    Yes [provider]  acetaminophen (TYLENOL) 160 MG/5ML liquid Take 325 mg by mouth as needed for fever.    [provider]  allopurinol (ZYLOPRIM) 100 MG tablet Take 2 tablets (200 mg total) by mouth daily. Patient not taking: No sig reported 08/07/20   MilWardell HonourD  ivabradine (CORLANOR) 5 MG TABS tablet TAKE 1 TABLET(5 MG) BY MOUTH TWICE DAILY WITH A MEAL Patient taking differently: Take 5 mg by mouth 2 (two) times daily with a meal. 05/25/20   McLLarey DresserD  loperamide (IMODIUM) 2 MG capsule Take 2 mg by mouth 3 (three) times daily as needed for diarrhea or loose stools. 08/17/20   [provider]    Physical Exam: Vitals:   10/08/20 1849 10/08/20 2017 10/08/20 2030 10/08/20 2155  BP: 108/79 (!) 154/85 130/78 (!) 179/96  Pulse: (!) 107 89 88 95  Resp: 16 17 18  (!) 22  Temp: 99.1 F (37.3 C)     SpO2: 96% 98% 97% 99%    Constitutional: NAD, calm, comfortable Vitals:   10/08/20 1849 10/08/20 2017 10/08/20 2030 10/08/20 2155  BP: 108/79 (!) 154/85 130/78 (!) 179/96  Pulse: (!) 107 89 88 95  Resp: 16 17 18  (!) 22  Temp: 99.1 F (37.3 C)     SpO2: 96% 98% 97% 99%   General: WDWN, Alert and oriented x3.  Eyes: EOMI, PERRL, conjunctivae normal.  Sclera nonicteric HENT:  [place]/AT, external ears normal.  Nares patent without epistasis.  Mucous membranes are dry. Posterior pharynx clear   Neck: Soft, normal range of motion, supple, no masses, no thyromegaly. Trachea midline Respiratory: clear to auscultation bilaterally, no wheezing, no crackles. Normal respiratory effort. No accessory muscle use.  Cardiovascular: Regular rate and rhythm, no murmurs / rubs / gallops. No extremity edema. 2+ pedal pulses.  Abdomen: Soft, no tenderness, nondistended, no rebound or guarding. No masses palpated. No hepatosplenomegaly. Bowel sounds normoactive Musculoskeletal: FROM. no cyanosis. No joint deformity upper and lower extremities. Normal muscle tone.  Skin: Warm, dry, intact no rashes, lesions, ulcers. No induration Neurologic: CN 2-12 grossly intact.  Normal speech. Sensation intact to touch. Strength 3/5 in lower extremities. Grip strength 4/5 bilaterally. No tremor  Psychiatric: Normal judgment and insight.  Normal mood.    Labs on Admission: I have personally reviewed following labs and imaging studies  CBC: Recent Labs  Lab 10/08/20 1855  WBC 12.0*  NEUTROABS 8.5*  HGB 11.6*  HCT 37.1*  MCV 106.3*  PLT 818    Basic Metabolic Panel: Recent Labs  Lab 10/08/20 1855  NA 142  K 2.7*  CL 108  CO2 25  GLUCOSE 114*  BUN 29*  CREATININE 2.24*  CALCIUM 8.5*  MG 1.3*     GFR: Estimated Creatinine Clearance: 28.6 mL/min (A) (by C-G formula based on SCr of 2.24 mg/dL (H)).  Liver Function Tests: Recent Labs  Lab 10/08/20 1855  AST 13*  ALT 10  ALKPHOS 67  BILITOT 0.5  PROT 6.9  ALBUMIN 2.7*    Urine analysis:    Component Value Date/Time   COLORURINE YELLOW 08/14/2020 1017   APPEARANCEUR CLEAR 08/14/2020 1017   LABSPEC 1.009 08/14/2020 1017   PHURINE 6.0 08/14/2020 1017   GLUCOSEU >=500 (A) 08/14/2020 1017   HGBUR NEGATIVE 08/14/2020 [place] 08/14/2020 1017   BILIRUBINUR Positive 04/26/2020 1504   KETONESUR NEGATIVE 08/14/2020 1017   PROTEINUR NEGATIVE 08/14/2020 1017   UROBILINOGEN negative (A) 04/26/2020 1504   UROBILINOGEN 0.2 07/10/2010 1709   NITRITE NEGATIVE 08/14/2020 1017   LEUKOCYTESUR NEGATIVE 08/14/2020 1017    Radiological Exams on Admission: DG Chest Portable 1 View  Result Date: 10/08/2020 CLINICAL DATA:  Weakness EXAM: PORTABLE CHEST 1 VIEW COMPARISON:  08/14/2020 FINDINGS: Left-sided pacing device. No focal opacity or pleural effusion. Normal cardiomediastinal silhouette. No pneumothorax IMPRESSION: No active disease. Electronically Signed   By: Donavan Foil M.D.   On: 10/08/2020 20:56    EKG: Independently reviewed.  EKG shows sinus tachycardia with LVH.  No acute ST elevation or depression.  QTc 446  Assessment/Plan Principal Problem:   Hypokalemia Brent [name] has been having diarrhea for past few days which is most likely etiology of electrolyte disturbance. Given supplemental potassium after magnesium is replaced.  Monitor on telemetry.  Check electrolytes in am to monitor.  IVF hydration gently overnight with LR after profound diarrhea past few days.   Active Problems:  Hypomagnesemia Magnesium replaced with 2 gm IV in ER.     Essential hypertension, benign Continue home medications of metoprolol, hydralazine. Monitor BP.    Weakness Pt has not been able to walk or get up from floor  or chair past two days due to profound weakness. Consult PT for evaluation in am.    CKD (chronic kidney disease), stage III B Stable. Recheck renal function with labs in am    Anemia in chronic kidney disease Stable.     Chronic systolic CHF (congestive heart failure)  Stable. Continue metoprolol, lasix, farxiga. Monitor fluid status.     Covid infection Dx with Covid 10-12 days ago at the [place] clinic. No oxygen requirement or SOB.     ICD (implantable cardioverter-defibrillator) in place     DVT prophylaxis: Brent [name] is anticoagulated on Eliquis which will be continued Code Status:   Full code  Family Communication:  Diagnosis and plan discussed with patient.  He verbalized understanding agrees with plan.  Further recommendations to follow as clinical indicated Disposition Plan:   Patient is from:  Home  Anticipated DC to:  Home  Anticipated DC date:  Anticipate 2 midnight stay  Admission status:  Inpatient  Yevonne Aline Emberlin Verner MD [facility]  How to contact the [facility] Attending or Consulting provider [place] or covering provider during after hours [place], for this patient?   Check the care team in [facility] and look for a) attending/consulting [facility] provider listed and b) the [facility] team listed Log into [email or web address] and use [place]'s universal password to access. If you do not have the password, please contact the hospital operator. Locate the [facility] provider you are looking for under [facility] and page to a number that you can be directly reached. If you still have difficulty reaching the provider, please page the [facility] (Director on Call) for the Hospitalists listed on amion for assistance.  10/08/2020, 10:48 PM

## 2020-10-08 NOTE — ED Provider Notes (Addendum)
[facility] EMERGENCY DEPARTMENT Provider Note   CSN: [id number] Arrival date & time: 10/08/20  [place]     History Chief Complaint  Patient presents with   Abnormal Lab    Brent [name] is a 75 y.o. male.  HPI Patient is a 75 year old male with a history of anemia, BPPV, bipolar 1 disorder, CHF, CKD, Crohn's hypertension, pulmonary fibrosis, who presents to the emergency department due to weakness.  Patient states he was diagnosed with COVID-19 about 9 days ago.  States that he was experiencing rhinorrhea as well as persistent diarrhea for 5 days.  States that the diarrhea was so severe that he was having regular bowel movements prior to making it to the bathroom.  States that his diarrhea resolved about 4 days ago but he continued to have generalized weakness since this occurred.  States that he has not had any syncopal episodes but he will sit or lie down and then will be too weak to get himself back up off the ground.  States he was lying in his yard a couple of days ago and a passerby had to stand him up to help him back into his home.  He went to the [place] this morning and was told that his potassium and calcium was low and he should come to the emergency department immediately for evaluation.  Patient denies any chest pain, shortness of breath, abdominal pain, nausea, vomiting, diarrhea.  His only complaint at this time is generalized weakness.    Past Medical History:  Diagnosis Date   Anemia    Anxiety    Benign paroxysmal positional vertigo    Bipolar I disorder, most recent episode (or current) unspecified    Celiac disease    CHF (congestive heart failure) (HCC)    Chronic kidney disease, stage III (moderate) (HCC)    Crohn's    Remicade q8 weeks   Depression    Essential and other specified forms of tremor    medication-induced Parkinson's, now resolved   Essential hypertension, benign    Gout 2018   Heart failure ([place])    Hypertrophy of prostate without  urinary obstruction and other lower urinary tract symptoms (LUTS)    Impotence of organic origin    Insomnia with sleep apnea, unspecified    Iron deficiency anemia, unspecified    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   Neuralgia, neuritis, and radiculitis, unspecified    Other B-complex deficiencies    Other extrapyramidal disease and abnormal movement disorder    Postinflammatory pulmonary fibrosis ([place])    Tobacco use disorder    Vertigo 2018    Patient Active Problem List   Diagnosis Date Noted   Electrolyte abnormality 05/17/2020   ICD (implantable cardioverter-defibrillator) in place 67/61/9509   Chronic systolic heart failure ([place]) 01/02/2020   Decreased appetite 10/08/2019   Weakness 10/08/2019   Hyperkalemia 32/67/1245   Chronic systolic CHF (congestive heart failure) ([place]) 08/26/2019   Type 2 diabetes mellitus with stage 3 chronic kidney disease ([place]) 08/26/2019   Chronic obstructive pulmonary disease ([place]) 06/20/2019   Ventricular tachycardia ([place]) 06/20/2019   Acute on chronic systolic (congestive) heart failure ([place]) 80/99/8338   Metabolic acidosis 25/06/3974   Orthostasis 09/07/2018   AKI (acute kidney injury) ([place])    Immunosuppressed status ([place])    Macrocytic anemia    Chronic combined systolic and diastolic congestive heart failure ([place]) 06/03/2018   Candida esophagitis ([place]) 09/22/2017   History of smoking 30 or more pack years 01/05/2017   Bipolar  1 disorder (HCC)    BPH (benign prostatic hyperplasia) 03/31/2013   Anemia in chronic kidney disease 03/31/2013   Essential hypertension, benign 03/31/2013   Acute renal failure superimposed on stage 3 chronic kidney disease ([place]) 06/04/2012   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[place]) 01/23/2010    Past Surgical History:  Procedure Laterality Date   CATARACT EXTRACTION, BILATERAL Bilateral 09/2018   CHOLECYSTECTOMY  07-12-2010   ICD IMPLANT N/A 01/02/2020   Procedure: ICD IMPLANT;  Surgeon: Evans Lance, MD;  Location: [place]  CV LAB;  Service: Cardiovascular;  Laterality: N/A;   RIGHT HEART CATH N/A 10/12/2018   Procedure: RIGHT HEART CATH;  Surgeon: Jolaine Artist, MD;  Location: [place] CV LAB;  Service: Cardiovascular;  Laterality: N/A;   RIGHT/LEFT HEART CATH AND CORONARY ANGIOGRAPHY N/A 04/26/2019   Procedure: RIGHT/LEFT HEART CATH AND CORONARY ANGIOGRAPHY;  Surgeon: Larey Dresser, MD;  Location: [place] CV LAB;  Service: Cardiovascular;  Laterality: N/A;   SMALL INTESTINE SURGERY     x 2       Family History  Problem Relation Age of Onset   Diabetes Mother        maternal grandmother   Uterine cancer Mother    Emphysema Father    Pneumonia Maternal Grandmother    Colon cancer Neg Hx     Social History   Tobacco Use   Smoking status: Former    Packs/day: 1.00    Years: 49.00    Pack years: 49.00    Types: Cigarettes    Start date: 04/11/1956    Quit date: 04/17/2014    Years since quitting: 6.4   Smokeless tobacco: Never  Vaping Use   Vaping Use: Never used  Substance Use Topics   Alcohol use: No    Alcohol/week: 0.0 standard drinks   Drug use: No    Home Medications Prior to Admission medications   Medication Sig Start Date End Date Taking? Authorizing Provider  acetaminophen (TYLENOL) 160 MG/5ML liquid Take 325 mg by mouth as needed for fever.    [provider]  allopurinol (ZYLOPRIM) 100 MG tablet Take 2 tablets (200 mg total) by mouth daily. Patient not taking: No sig reported 08/07/20   Wardell Honour, MD  apixaban (ELIQUIS) 5 MG TABS tablet Take 1 tablet (5 mg total) by mouth 2 (two) times daily. 05/25/20 10/03/20  Larey Dresser, MD  BUDESONIDE PO Take 9 mg by mouth daily.    [provider]  Calcium 500 MG tablet Take 1 tablet (500 mg total) by mouth 1 day or 1 dose for 1 dose. 05/19/20 10/03/20  Geradine Girt, DO  Cholecalciferol (VITAMIN D) 125 MCG (5000 UT) CAPS Take 5,000 Units by mouth daily.     [provider]  dapagliflozin  propanediol (FARXIGA) 10 MG TABS tablet Take 1 tablet (10 mg total) by mouth daily before breakfast. 08/28/20   Larey Dresser, MD  divalproex (DEPAKOTE ER) 500 MG 24 hr tablet Take 1,500 mg by mouth at bedtime.     [provider]  epoetin alfa (EPOGEN) 10000 UNIT/ML injection Inject 10,000 Units into the skin every 30 (thirty) days.    [provider]  ferrous sulfate 325 (65 FE) MG tablet Take 325 mg by mouth in the morning and at bedtime.    [provider]  furosemide (LASIX) 20 MG tablet Take 1 tablet (20 mg total) by mouth every Monday, Wednesday, and Friday. 06/20/20   Bensimhon, Shaune Pascal,  MD  hydrALAZINE (APRESOLINE) 25 MG tablet Take 25 mg by mouth 3 (three) times daily. Patient not taking: No sig reported    [provider]  inFLIXimab (REMICADE) 100 MG injection Infuse Remicade IV schedule 1 31m/kg every 8 weeks Premedicate with Tylenol 500-658mby mouth and Benadryl 25-5043my mouth prior to infusion. Last PPD was on 12/2009.  11/22/10   JacMilus BanisterD  isosorbide mononitrate (IMDUR) 60 MG 24 hr tablet Take 1 tablet (60 mg total) by mouth daily. 03/28/20   Bensimhon, DanShaune PascalD  ivabradine (CORLANOR) 5 MG TABS tablet TAKE 1 TABLET(5 MG) BY MOUTH TWICE DAILY WITH A MEAL Patient taking differently: Take 5 mg by mouth 2 (two) times daily with a meal. 05/25/20   McLLarey DresserD  loperamide (IMODIUM) 2 MG capsule Take 2 mg by mouth 3 (three) times daily as needed. 08/17/20   [provider]  Magnesium 400 MG TABS Take 400 mg by mouth daily. 05/19/20   VanGeradine GirtO  metoprolol succinate (TOPROL-XL) 25 MG 24 hr tablet Take 1 tablet (25 mg total) by mouth in the morning and at bedtime. 03/28/20   Bensimhon, DanShaune PascalD  mirtazapine (REMERON) 7.5 MG tablet TAKE 1 TABLET(7.5 MG) BY MOUTH AT BEDTIME 08/17/20   MilWardell HonourD  NON FORMULARY Mucositis mouth wash- uses as needed 08/21/20   [provider]  omeprazole (PRILOSEC)  20 MG capsule Take 20 mg by mouth daily.    [provider]  QUEtiapine (SEROQUEL) 100 MG tablet Take 150 mg by mouth at bedtime. 01/25/20   [provider]  solifenacin (VESICARE) 5 MG tablet Take 5 mg by mouth daily.    [provider]  tamsulosin (FLOMAX) 0.4 MG CAPS capsule Take 1 capsule (0.4 mg total) by mouth daily. 06/16/19   Reed, Tiffany L, DO  vitamin B-12 (CYANOCOBALAMIN) 500 MCG tablet Take 1,000 mcg by mouth daily.     [provider]    Allergies    Azathioprine, Ciprofloxacin, Levaquin [levofloxacin in d5w], and Plendil [felodipine]  Review of Systems   Review of Systems  All other systems reviewed and are negative. Ten systems reviewed and are negative for acute change, except as noted in the HPI.   Physical Exam Updated Vital Signs BP (!) 179/96   Pulse 95   Temp 99.1 F (37.3 C)   Resp (!) 22   SpO2 99%   Physical Exam Vitals and nursing note reviewed.  Constitutional:      General: He is not in acute distress.    Appearance: Normal appearance. He is not ill-appearing, toxic-appearing or diaphoretic.  HENT:     Head: Normocephalic and atraumatic.     Right Ear: External ear normal.     Left Ear: External ear normal.     Nose: Nose normal.     Mouth/Throat:     Mouth: Mucous membranes are moist.     Pharynx: Oropharynx is clear. No oropharyngeal exudate or posterior oropharyngeal erythema.  Eyes:     General: No scleral icterus.       Right eye: No discharge.        Left eye: No discharge.     Extraocular Movements: Extraocular movements intact.     Conjunctiva/sclera: Conjunctivae normal.  Cardiovascular:     Rate and Rhythm: Normal rate and regular rhythm.     Pulses: Normal pulses.     Heart sounds: Normal heart sounds. No murmur heard.  No friction rub. No gallop.  Pulmonary:     Effort: Pulmonary effort is normal. No respiratory distress.     Breath sounds: Normal breath sounds. No stridor. No wheezing, rhonchi  or rales.  Abdominal:     General: Abdomen is flat.     Palpations: Abdomen is soft.     Tenderness: There is no abdominal tenderness.     Comments: Ventral hernia noted.  Abdomen is soft and nontender in all 4 quadrants.  Musculoskeletal:        General: Normal range of motion.     Cervical back: Normal range of motion and neck supple. No tenderness.  Skin:    General: Skin is warm and dry.  Neurological:     General: No focal deficit present.     Mental Status: He is alert and oriented to person, place, and time.  Psychiatric:        Mood and Affect: Mood normal.        Behavior: Behavior normal.   ED Results / Procedures / Treatments   Labs (all labs ordered are listed, but only abnormal results are displayed) Labs Reviewed  CBC WITH DIFFERENTIAL/PLATELET - Abnormal; Notable for the following components:      Result Value   WBC 12.0 (*)    RBC 3.49 (*)    Hemoglobin 11.6 (*)    HCT 37.1 (*)    MCV 106.3 (*)    Neutro Abs 8.5 (*)    All other components within normal limits  COMPREHENSIVE METABOLIC PANEL - Abnormal; Notable for the following components:   Potassium 2.7 (*)    Glucose, Bld 114 (*)    BUN 29 (*)    Creatinine, Ser 2.24 (*)    Calcium 8.5 (*)    Albumin 2.7 (*)    AST 13 (*)    GFR, Estimated 30 (*)    All other components within normal limits  MAGNESIUM - Abnormal; Notable for the following components:   Magnesium 1.3 (*)    All other components within normal limits  RESP PANEL BY RT-PCR (FLU A&B, COVID) [id number]  URINALYSIS, ROUTINE W REFLEX MICROSCOPIC    EKG EKG Interpretation  Date/Time:  Monday October 08 2020 18:46:17 EDT Ventricular Rate:  106 PR Interval:  140 QRS Duration: 90 QT Interval:  336 QTC Calculation: 446 R Axis:   1 Text Interpretation: Sinus tachycardia Left ventricular hypertrophy with repolarization abnormality ( R in aVL , Sokolow-Lyon ) Abnormal ECG Confirmed by Lacretia Leigh ([id number]) on 10/08/2020 8:54:56  PM  Radiology DG Chest Portable 1 View  Result Date: 10/08/2020 CLINICAL DATA:  Weakness EXAM: PORTABLE CHEST 1 VIEW COMPARISON:  08/14/2020 FINDINGS: Left-sided pacing device. No focal opacity or pleural effusion. Normal cardiomediastinal silhouette. No pneumothorax IMPRESSION: No active disease. Electronically Signed   By: Donavan Foil M.D.   On: 10/08/2020 20:56    Procedures Procedures   Medications Ordered in ED Medications  potassium chloride 10 mEq in 100 mL IVPB (10 mEq Intravenous New Bag/Given 10/08/20 2140)  magnesium sulfate IVPB 2 g 50 mL (2 g Intravenous New Bag/Given 10/08/20 2141)  potassium chloride SA (KLOR-CON) CR tablet 60 mEq (60 mEq Oral Given 10/08/20 2141)    ED Course  I have reviewed the triage vital signs and the nursing notes.  Pertinent labs & imaging results that were available during my care of the patient were reviewed by me and considered in my medical decision making (see chart for details).  Clinical Course as  of 10/08/20 2211  Mon Oct 08, 2020  2050 Potassium(!!): 2.7 [LJ]  2050 Magnesium(!): 1.3 [LJ]  2050 Calcium(!): 8.5 [LJ]    Clinical Course User Index [LJ] Rayna Sexton, PA-C   MDM Rules/Calculators/A&P                          Pt is a 75 y.o. male who presents to the emergency department due to weakness.  He was evaluated at the [facility] this morning and told that he had low potassium and calcium and he should come to the emergency department for further evaluation.  Labs: CBC with a white count of 12, hemoglobin of 11.6, MCV of 106.3, neutrophils of 8.5. Magnesium decreased at 1.3. CMP with a potassium of 2.7, glucose of 114, BUN of 29, creatinine of 2.24, calcium of 8.5, albumin of 2.7, AST 13, GFR 30.Marland Kitchen UA is pending. Respiratory panel is pending.  Imaging: Chest x-ray is negative.  I, Rayna Sexton, PA-C, personally reviewed and evaluated these images and lab results as part of my medical decision-making.  Patient with  significant hypomagnesemia as well as hypokalemia.  He does not drink alcohol but does note taking Lasix.  He supplements both potassium as well as magnesium and notes compliance with these medications.  Believe that his electrolyte abnormalities were likely exacerbated due to his recent diarrheal illness while battling COVID-19.  Patient lives alone and reports significant weakness over the past few days.  He typically ambulates with a cane at baseline but states that he is having difficulty getting out of a chair or off the floor due to his weakness.  He states that he recently had to be helped up out of his yard and into his home because his generalized weakness has gotten so severe.  Given patient's electrolyte abnormalities as well as weakness, feel that admission is warranted for further observation.  Will discuss with the medicine team.  Note: Portions of this report may have been transcribed using voice recognition software. Every effort was made to ensure accuracy; however, inadvertent computerized transcription errors may be present.   Final Clinical Impression(s) / ED Diagnoses Final diagnoses:  Hypokalemia  Hypomagnesemia  Weakness  AKI (acute kidney injury) [facility])    Rx / DC Orders ED Discharge Orders     None        Rayna Sexton, PA-C 10/08/20 2205    Rayna Sexton, PA-C 10/08/20 2211    Lacretia Leigh, MD 10/08/20 2250

## 2020-10-08 NOTE — ED Provider Notes (Signed)
Emergency Medicine Provider Triage Evaluation Note  Brent [name] , a 75 y.o. male  was evaluated in triage.  Pt complains of hypokalemia and hypocalcemia. Recently had covid, decreased appetite, and diarrhea.  Review of Systems  Positive: Diarrhea, decreased appetite Negative: Abd pain  Physical Exam  There were no vitals taken for this visit. Gen:   Awake, no distress   Resp:  Normal effort  MSK:   Moves extremities without difficulty  Other:  Abd soft and nontender  Medical Decision Making  Medically screening exam initiated at 6:48 PM.  Appropriate orders placed.  Brent [name] was informed that the remainder of the evaluation will be completed by another provider, this initial triage assessment does not replace that evaluation, and the importance of remaining in the ED until their evaluation is complete.     Rodney Booze, PA-C 10/08/20 Colma, Bowman, DO 10/08/20 1946

## 2020-10-09 DIAGNOSIS — E876 Hypokalemia: Principal | ICD-10-CM

## 2020-10-09 LAB — BASIC METABOLIC PANEL
Anion gap: 7 (ref 5–15)
BUN: 27 mg/dL — ABNORMAL HIGH (ref 8–23)
CO2: 23 mmol/L (ref 22–32)
Calcium: 8.5 mg/dL — ABNORMAL LOW (ref 8.9–10.3)
Chloride: 111 mmol/L (ref 98–111)
Creatinine, Ser: 2.06 mg/dL — ABNORMAL HIGH (ref 0.61–1.24)
GFR, Estimated: 33 mL/min — ABNORMAL LOW (ref >60)
Glucose, Bld: 94 mg/dL (ref 70–99)
Potassium: 3.3 mmol/L — ABNORMAL LOW (ref 3.5–5.1)
Sodium: 141 mmol/L (ref 135–145)

## 2020-10-09 LAB — ALBUMIN: Albumin: 2.5 g/dL — ABNORMAL LOW (ref 3.5–5.0)

## 2020-10-09 LAB — MAGNESIUM: Magnesium: 1.7 mg/dL (ref 1.7–2.4)

## 2020-10-09 LAB — URINALYSIS, ROUTINE W REFLEX MICROSCOPIC
Bilirubin Urine: NEGATIVE
Glucose, UA: NEGATIVE mg/dL
Hgb urine dipstick: NEGATIVE
Ketones, ur: NEGATIVE mg/dL
Leukocytes,Ua: NEGATIVE
Nitrite: NEGATIVE
Protein, ur: NEGATIVE mg/dL
Specific Gravity, Urine: 1.011 (ref 1.005–1.030)
pH: 6 (ref 5.0–8.0)

## 2020-10-09 MED ORDER — DAPAGLIFLOZIN PROPANEDIOL 10 MG PO TABS
10.0000 mg | ORAL_TABLET | Freq: Every day | ORAL | Status: DC
  Administered 2020-10-09 – 2020-10-15 (×7): 10 mg via ORAL
  Filled 2020-10-09 (×9): qty 1

## 2020-10-09 MED ORDER — APIXABAN 5 MG PO TABS
5.0000 mg | ORAL_TABLET | Freq: Two times a day (BID) | ORAL | Status: DC
  Administered 2020-10-09 – 2020-10-15 (×14): 5 mg via ORAL
  Filled 2020-10-09 (×14): qty 1

## 2020-10-09 MED ORDER — TAMSULOSIN HCL 0.4 MG PO CAPS
0.4000 mg | ORAL_CAPSULE | Freq: Every day | ORAL | Status: DC
  Administered 2020-10-09 – 2020-10-15 (×7): 0.4 mg via ORAL
  Filled 2020-10-09 (×7): qty 1

## 2020-10-09 MED ORDER — PANTOPRAZOLE SODIUM 40 MG PO TBEC
40.0000 mg | DELAYED_RELEASE_TABLET | Freq: Every day | ORAL | Status: DC
  Administered 2020-10-10 – 2020-10-15 (×6): 40 mg via ORAL
  Filled 2020-10-09 (×6): qty 1

## 2020-10-09 MED ORDER — ACETAMINOPHEN 325 MG PO TABS: 650.0000 mg | ORAL_TABLET | Freq: Four times a day (QID) | ORAL | Status: DC | PRN

## 2020-10-09 MED ORDER — ALLOPURINOL 100 MG PO TABS
200.0000 mg | ORAL_TABLET | Freq: Every day | ORAL | Status: DC
  Administered 2020-10-09 – 2020-10-15 (×7): 200 mg via ORAL
  Filled 2020-10-09 (×7): qty 2

## 2020-10-09 MED ORDER — QUETIAPINE FUMARATE 50 MG PO TABS
150.0000 mg | ORAL_TABLET | Freq: Every day | ORAL | Status: DC
  Administered 2020-10-09 – 2020-10-14 (×7): 150 mg via ORAL
  Filled 2020-10-09: qty 1
  Filled 2020-10-09: qty 3
  Filled 2020-10-09: qty 1
  Filled 2020-10-09 (×5): qty 3

## 2020-10-09 MED ORDER — ONDANSETRON HCL 4 MG PO TABS: 4.0000 mg | ORAL_TABLET | Freq: Four times a day (QID) | ORAL | Status: DC | PRN

## 2020-10-09 MED ORDER — ACETAMINOPHEN 650 MG RE SUPP: 650.0000 mg | Freq: Four times a day (QID) | RECTAL | Status: DC | PRN

## 2020-10-09 MED ORDER — DIVALPROEX SODIUM ER 500 MG PO TB24
1500.0000 mg | ORAL_TABLET | Freq: Every day | ORAL | Status: DC
  Administered 2020-10-09 – 2020-10-14 (×7): 1500 mg via ORAL
  Filled 2020-10-09 (×9): qty 3

## 2020-10-09 MED ORDER — GUAIFENESIN-DM 100-10 MG/5ML PO SYRP
5.0000 mL | ORAL_SOLUTION | ORAL | Status: DC | PRN
  Administered 2020-10-09 – 2020-10-14 (×7): 5 mL via ORAL
  Filled 2020-10-09 (×7): qty 5

## 2020-10-09 MED ORDER — POTASSIUM CHLORIDE 10 MEQ/100ML IV SOLN
10.0000 meq | INTRAVENOUS | Status: AC
  Administered 2020-10-09 (×3): 10 meq via INTRAVENOUS
  Filled 2020-10-09 (×3): qty 100

## 2020-10-09 MED ORDER — ONDANSETRON HCL 4 MG/2ML IJ SOLN: 4.0000 mg | Freq: Four times a day (QID) | INTRAMUSCULAR | Status: DC | PRN

## 2020-10-09 MED ORDER — LACTATED RINGERS IV SOLN: INTRAVENOUS | Status: DC

## 2020-10-09 MED ORDER — DARIFENACIN HYDROBROMIDE ER 7.5 MG PO TB24
7.5000 mg | ORAL_TABLET | Freq: Every day | ORAL | Status: DC
  Administered 2020-10-09 – 2020-10-15 (×7): 7.5 mg via ORAL
  Filled 2020-10-09 (×9): qty 1

## 2020-10-09 MED ORDER — VITAMIN B-12 1000 MCG PO TABS
1000.0000 ug | ORAL_TABLET | Freq: Every day | ORAL | Status: DC
  Administered 2020-10-10 – 2020-10-15 (×6): 1000 ug via ORAL
  Filled 2020-10-09 (×6): qty 1

## 2020-10-09 MED ORDER — FERROUS SULFATE 325 (65 FE) MG PO TABS
325.0000 mg | ORAL_TABLET | Freq: Two times a day (BID) | ORAL | Status: DC
  Administered 2020-10-09 – 2020-10-15 (×14): 325 mg via ORAL
  Filled 2020-10-09 (×15): qty 1

## 2020-10-09 MED ORDER — ENSURE ENLIVE PO LIQD
237.0000 mL | Freq: Two times a day (BID) | ORAL | Status: DC
  Administered 2020-10-10 – 2020-10-13 (×7): 237 mL via ORAL

## 2020-10-09 MED ORDER — MIRTAZAPINE 15 MG PO TABS
7.5000 mg | ORAL_TABLET | Freq: Every day | ORAL | Status: DC
  Administered 2020-10-09 – 2020-10-14 (×7): 7.5 mg via ORAL
  Filled 2020-10-09 (×8): qty 1

## 2020-10-09 MED ORDER — IVABRADINE HCL 5 MG PO TABS
5.0000 mg | ORAL_TABLET | Freq: Two times a day (BID) | ORAL | Status: DC
  Administered 2020-10-09 – 2020-10-15 (×13): 5 mg via ORAL
  Filled 2020-10-09 (×17): qty 1

## 2020-10-09 MED ORDER — BUDESONIDE 3 MG PO CPEP
9.0000 mg | ORAL_CAPSULE | Freq: Every day | ORAL | Status: DC
  Administered 2020-10-09 – 2020-10-15 (×7): 9 mg via ORAL
  Filled 2020-10-09 (×9): qty 3

## 2020-10-09 MED ORDER — ISOSORBIDE MONONITRATE ER 60 MG PO TB24
60.0000 mg | ORAL_TABLET | Freq: Every day | ORAL | Status: DC
  Administered 2020-10-09 – 2020-10-15 (×7): 60 mg via ORAL
  Filled 2020-10-09 (×3): qty 1
  Filled 2020-10-09: qty 2
  Filled 2020-10-09 (×4): qty 1

## 2020-10-09 MED ORDER — POTASSIUM CHLORIDE CRYS ER 20 MEQ PO TBCR
40.0000 meq | EXTENDED_RELEASE_TABLET | Freq: Once | ORAL | Status: AC
  Administered 2020-10-09: 40 meq via ORAL
  Filled 2020-10-09: qty 2

## 2020-10-09 MED ORDER — METOPROLOL SUCCINATE ER 25 MG PO TB24
25.0000 mg | ORAL_TABLET | Freq: Two times a day (BID) | ORAL | Status: DC
  Administered 2020-10-09 – 2020-10-15 (×14): 25 mg via ORAL
  Filled 2020-10-09 (×14): qty 1

## 2020-10-09 MED ORDER — MAGNESIUM OXIDE -MG SUPPLEMENT 400 (240 MG) MG PO TABS
400.0000 mg | ORAL_TABLET | Freq: Every day | ORAL | Status: DC
  Administered 2020-10-09 – 2020-10-15 (×7): 400 mg via ORAL
  Filled 2020-10-09 (×7): qty 1

## 2020-10-09 NOTE — Plan of Care (Signed)

## 2020-10-09 NOTE — ED Notes (Signed)
Patient resting in stretcher comfortably. Eyes closed, Equal chest rise and fall. Patient alert to verbal stimuli. Call bell in reach, Stretcher in low and locked position. Side rails up x2.   

## 2020-10-09 NOTE — Evaluation (Signed)
Physical Therapy Evaluation Patient Details Name: Brent [name] MRN: [id number] DOB: Mar 06, 1945 Today's Date: 10/09/2020   History of Present Illness  75 yo male presents to [facility] with weakness, debility, diarrhea. Pt + for hypokalemia, hypocalcemia; recently had covid. PMH includes: CKD IV, CHF, chron's disease, HTN, BPPV, celiac disease, ICD, and gout.  Clinical Impression   Pt presents with weakness, impaired balance with history of recent falls, impaired gait with shuffling pattern, and decreased activity tolerance. Pt to benefit from acute PT to address deficits. Pt ambulated room distance with cane and mod PT assist to steady and correct intermittent LOB. PT recommending ST-SNF post-acutely to address mobility deficits and maximize functional independence. PT to progress mobility as tolerated, and will continue to follow acutely.      Follow Up Recommendations SNF;Supervision/Assistance - 24 hour    Equipment Recommendations  None recommended by PT    Recommendations for Other Services       Precautions / Restrictions Precautions Precautions: Fall Restrictions Weight Bearing Restrictions: No      Mobility  Bed Mobility Overal bed mobility: Needs Assistance Bed Mobility: Supine to Sit;Sit to Supine     Supine to sit: Mod assist;HOB elevated Sit to supine: Min assist   General bed mobility comments: min-mod assist for supine<>sit for truncal management, LE lifting into and out of bed. Increased time, use of HOB elevation and bedrails.    Transfers Overall transfer level: Needs assistance Equipment used: Straight cane Transfers: Sit to/from Stand Sit to Stand: Min assist         General transfer comment: min assist for rise and steady, rises quickly and unsteadily.  Ambulation/Gait Ambulation/Gait assistance: Mod assist Gait Distance (Feet): 15 Feet Assistive device: Straight cane Gait Pattern/deviations: Step-through pattern;Decreased stride  length;Shuffle;Trunk flexed;Drifts right/left Gait velocity: decr   General Gait Details: mod assist to steady, correct LOB, cues for upright posture. Pt with shuffling gait and bilat LE weakness noted in min buckling with fatigue.  Stairs            Wheelchair Mobility    Modified Rankin (Stroke Patients Only)       Balance Overall balance assessment: Needs assistance;History of Falls Sitting-balance support: No upper extremity supported;Feet supported Sitting balance-Leahy Scale: Fair     Standing balance support: During functional activity;Single extremity supported Standing balance-Leahy Scale: Poor Standing balance comment: reliant on external assist, up to mod PT assist to correct LOB                             Pertinent Vitals/Pain Pain Assessment: No/denies pain    Home Living Family/patient expects to be discharged to:: Private residence Living Arrangements: Alone Available Help at Discharge: Friend(s);Available PRN/intermittently;Neighbor Type of Home: Apartment Home Access: Level entry     Home Layout: One level Home Equipment: [place] - 2 wheels;Cane - single point;Walker - 4 wheels      Prior Function Level of Independence: Independent with assistive device(s)         Comments: pt reports using cane for ambulation, reports 2 falls in the past 6 months, and states he has no one to help him on a regular basis     Hand Dominance   Dominant Hand: Right    Extremity/Trunk Assessment   Upper Extremity Assessment Upper Extremity Assessment: Generalized weakness    Lower Extremity Assessment Lower Extremity Assessment: Generalized weakness    Cervical / Trunk Assessment Cervical / Trunk Assessment: Kyphotic  Communication   Communication: HOH  Cognition Arousal/Alertness: Awake/alert Behavior During Therapy: WFL for tasks assessed/performed Overall Cognitive Status: Impaired/Different from baseline Area of Impairment: Problem  solving                             Problem Solving: Slow processing;Difficulty sequencing;Requires verbal cues;Requires tactile cues General Comments: pt attemptng to get to EOB upon PT arrival to room, all rails up. Pt requires increased time to follow instructions from PT, also [facility]      General Comments      Exercises     Assessment/Plan    PT Assessment Patient needs continued PT services  PT Problem List Decreased strength;Decreased mobility;Decreased safety awareness;Decreased activity tolerance;Decreased balance;Decreased knowledge of use of DME;Pain;Decreased cognition;Decreased knowledge of precautions;Decreased coordination       PT Treatment Interventions DME instruction;Therapeutic activities;Gait training;Therapeutic exercise;Patient/family education;Balance training;Functional mobility training;Neuromuscular re-education    PT Goals (Current goals can be found in the Care Plan section)  Acute Rehab PT Goals Patient Stated Goal: get stronger PT Goal Formulation: With patient Time For Goal Achievement: 10/23/20 Potential to Achieve Goals: Good    Frequency Min 2X/week   Barriers to discharge Decreased caregiver support      Co-evaluation               AM-PAC PT "6 Clicks" Mobility  Outcome Measure Help needed turning from your back to your side while in a flat bed without using bedrails?: A Little Help needed moving from lying on your back to sitting on the side of a flat bed without using bedrails?: A Lot Help needed moving to and from a bed to a chair (including a wheelchair)?: A Lot Help needed standing up from a chair using your arms (e.g., wheelchair or bedside chair)?: A Little Help needed to walk in hospital room?: A Lot Help needed climbing 3-5 steps with a railing? : Total 6 Click Score: 13    End of Session   Activity Tolerance: Patient limited by fatigue Patient left: in bed;with call bell/phone within reach;Other (comment)  (all rails up, no bed alarm on bed in ED) Nurse Communication: Mobility status PT Visit Diagnosis: Unsteadiness on feet (R26.81);History of falling (Z91.81);Muscle weakness (generalized) (M62.81)    Time: 5686-1683 PT Time Calculation (min) (ACUTE ONLY): 13 min   Charges:   PT Evaluation $PT Eval Low Complexity: 1 Low         Cassandria Drew S, PT DPT Acute Rehabilitation Services Pager [phone]  Office [phone]   Roxine Caddy E Ruffin Pyo 10/09/2020, 12:13 PM

## 2020-10-09 NOTE — Progress Notes (Addendum)
PROGRESS NOTE    Brent [name]  NFA:[id number] DOB: April 21, 1945 DOA: 10/08/2020 PCP: Brent Honour, MD   Chief Complaint  Patient presents with   Abnormal Lab   Brief Narrative:  Brent [name] is Brent [name] 75 y.o. male with medical history significant for HFrEF, HTN, BPH, Crohn's disease, CKD 3 B, on eliquis and has ICD who presents from home via the [place] clinic for low potassium level and generalized weakness.  He reports he was diagnosised with Covid 10 days prior to admission.  He developed diarrhea 4-5 days ago. Has had multiple bouts daily of watery nonbloody diarrhea.  He was seen at the [facility] for evaluation of generalized weaknes and being unable to get up.  His blood work showed hypokalemia and hypocalcemia and he was sent to the ED.      Assessment & Plan:   Principal Problem:   Hypokalemia Active Problems:   Anemia in chronic kidney disease   Essential hypertension, benign   Chronic systolic CHF (congestive heart failure) (HCC)   Weakness   ICD (implantable cardioverter-defibrillator) in place   Hypomagnesemia   CKD (chronic kidney disease), stage III ([place])   COVID-19 virus infection  Generalized Weakness In the setting of recent covid infection, electrolyte abnormalities He spent few hrs sitting on ground in his yard and was unable to get up, Brent [name] passerby had to help Also unable to get up from chair at home and had to have people come by to help him PT recommending SNF orthostatics Continue IVF, electrolyte repletion  Hypokalemia  Hypomagnesemia Replace and follow  Recent COVID 19 infection Will double check when he was positive, but our test here is negative Suspect we can discontinue isolation - but will try to determine the date of his first positive - care everywhere - noted [facility] ([facility]). Will d/c isolation  HFrEF  S/p ICD EF 2021 25-30% Continue imdur, metoprolol, ivarbradine, farxiga Appears euvolemic - hold lasix  Strict I/O, daily  weights  Hx Crohn's Continue entocort  Not clear if he's continuing remicade - would review with patient  Bipolar disorder Continue Depakote, seroquel, remeron   CKD IIIb Appears relatively stable, follow  Overactive bladder Vesicare  BPH Flomax  Gout Allopurinol  Anemia Follow  Eliquis - unclear indication, will need to discuss further with pt?  DVT prophylaxis: (eliquis Code Status: full  Family Communication: none at bedside Disposition:   Status is: Inpatient  Remains inpatient appropriate because:Inpatient level of care appropriate due to severity of illness  Dispo: The patient is from: Home              Anticipated d/c is to: Home              Patient currently is not medically stable to d/c.   Difficult to place patient No       Consultants:  none  Procedures:  none  Antimicrobials:  Anti-infectives (From admission, onward)    None          Subjective: No complaints at this time  Objective: Vitals:   10/09/20 1000 10/09/20 1100 10/09/20 1300 10/09/20 1452  BP: (!) 176/100 (!) 160/103 (!) 148/106 (!) 145/95  Pulse: 99 87 84 81  Resp: 17 19 17 19   Temp:    98.4 F (36.9 C)  TempSrc:    Oral  SpO2: 96% 96% 96% 95%    Intake/Output Summary (Last 24 hours) at 10/09/2020 1609 Last data filed at 10/09/2020 1452  Gross per 24 hour  Intake 122.83 ml  Output --  Net 122.83 ml   There were no vitals filed for this visit.  Examination:  General exam: Appears calm and comfortable  Respiratory system: Clear to auscultation. Respiratory effort normal. Cardiovascular system: S1 & S2 heard, RRR. Gastrointestinal system: Abdomen is nondistended, soft and nontender.  Central nervous system: Alert and oriented. No focal neurological deficits. Generally weak, pulling himself to sit up in bed, then falls back repeatedly  Extremities: no LEE Skin: No rashes, lesions or ulcers Psychiatry: Judgement and insight appear normal. Mood & affect  appropriate.     Data Reviewed: I have personally reviewed following labs and imaging studies  CBC: Recent Labs  Lab 10/08/20 1855  WBC 12.0*  NEUTROABS 8.5*  HGB 11.6*  HCT 37.1*  MCV 106.3*  PLT 893    Basic Metabolic Panel: Recent Labs  Lab 10/08/20 1855 10/09/20 0435  NA 142 141  K 2.7* 3.3*  CL 108 111  CO2 25 23  GLUCOSE 114* 94  BUN 29* 27*  CREATININE 2.24* 2.06*  CALCIUM 8.5* 8.5*  MG 1.3* 1.7    GFR: Estimated Creatinine Clearance: 31.1 mL/min (Brent [name]) (by C-G formula based on SCr of 2.06 mg/dL (H)).  Liver Function Tests: Recent Labs  Lab 10/08/20 1855  AST 13*  ALT 10  ALKPHOS 67  BILITOT 0.5  PROT 6.9  ALBUMIN 2.7*    CBG: No results for input(s): GLUCAP in the last 168 hours.   Recent Results (from the past 240 hour(s))  Resp Panel by RT-PCR (Flu Brent [name]&B, Covid) Nasopharyngeal Swab     Status: None   Collection Time: 10/08/20 10:44 PM   Specimen: Nasopharyngeal Swab; Nasopharyngeal(NP) swabs in vial transport medium  Result Value Ref Range Status   SARS Coronavirus 2 by RT PCR NEGATIVE NEGATIVE Final    Comment: (NOTE) SARS-CoV-2 target nucleic acids are NOT DETECTED.  The SARS-CoV-2 RNA is generally detectable in upper respiratory specimens during the acute phase of infection. The lowest concentration of SARS-CoV-2 viral copies this assay can detect is 138 copies/mL. Brent [name] negative result does not preclude SARS-Cov-2 infection and should not be used as the sole basis for treatment or other patient management decisions. Brent [name] negative result may occur with  improper specimen collection/handling, submission of specimen other than nasopharyngeal swab, presence of viral mutation(s) within the areas targeted by this assay, and inadequate number of viral copies(<138 copies/mL). Brent [name] negative result must be combined with clinical observations, patient history, and epidemiological information. The expected result is Negative.  Fact Sheet for Patients:   EntrepreneurPulse.com.au  Fact Sheet for Healthcare Providers:  IncredibleEmployment.be  This test is no t yet approved or cleared by the [place] FDA and  has been authorized for detection and/or diagnosis of SARS-CoV-2 by FDA under an Emergency Use Authorization (EUA). This EUA will remain  in effect (meaning this test can be used) for the duration of the COVID-19 declaration under Section 564(b)(1) of the Act, 21 U.S.C.section 360bbb-3(b)(1), unless the authorization is terminated  or revoked sooner.       Influenza Jerilynn Feldmeier by PCR NEGATIVE NEGATIVE Final   Influenza B by PCR NEGATIVE NEGATIVE Final    Comment: (NOTE) The Xpert Xpress SARS-CoV-2/FLU/RSV plus assay is intended as an aid in the diagnosis of influenza from Nasopharyngeal swab specimens and should not be used as Gailen Venne sole basis for treatment. Nasal washings and aspirates are unacceptable for Xpert Xpress SARS-CoV-2/FLU/RSV testing.  Fact Sheet for Patients: EntrepreneurPulse.com.au  Fact Sheet for Healthcare Providers: IncredibleEmployment.be  This test is not yet approved or cleared by the [place] FDA and has been authorized for detection and/or diagnosis of SARS-CoV-2 by FDA under an Emergency Use Authorization (EUA). This EUA will remain in effect (meaning this test can be used) for the duration of the COVID-19 declaration under Section 564(b)(1) of the Act, 21 U.S.C. section 360bbb-3(b)(1), unless the authorization is terminated or revoked.  Performed at [facility], [place] [address], [place], [place] [address]          Radiology Studies: DG Chest Portable 1 View  Result Date: 10/08/2020 CLINICAL DATA:  Weakness EXAM: PORTABLE CHEST 1 VIEW COMPARISON:  08/14/2020 FINDINGS: Left-sided pacing device. No focal opacity or pleural effusion. Normal cardiomediastinal silhouette. No pneumothorax IMPRESSION: No active disease.  Electronically Signed   By: Donavan Foil M.D.   On: 10/08/2020 20:56        Scheduled Meds:  allopurinol  200 mg Oral Daily   apixaban  5 mg Oral BID   budesonide  9 mg Oral Q breakfast   dapagliflozin propanediol  10 mg Oral QAC breakfast   darifenacin  7.5 mg Oral Daily   divalproex  1,500 mg Oral QHS   ferrous sulfate  325 mg Oral BID WC   isosorbide mononitrate  60 mg Oral Daily   ivabradine  5 mg Oral BID WC   magnesium oxide  400 mg Oral Daily   metoprolol succinate  25 mg Oral BID   mirtazapine  7.5 mg Oral QHS   QUEtiapine  150 mg Oral QHS   tamsulosin  0.4 mg Oral Daily   Continuous Infusions:  lactated ringers Stopped (10/09/20 1452)     LOS: 1 day    Time spent: over 30 min    Fayrene Helper, MD [facility]   To contact the attending provider between 7A-7P or the covering provider during after hours 7P-7A, please log into the web site [email or web address] and access using universal [place] password for that web site. If you do not have the password, please call the hospital operator.  10/09/2020, 4:09 PM

## 2020-10-09 NOTE — ED Notes (Signed)
Report called to [employer] on 6N

## 2020-10-10 ENCOUNTER — Inpatient Hospital Stay (HOSPITAL_COMMUNITY): Admission: RE | Admit: 2020-10-10 | Payer: No Typology Code available for payment source | Source: Ambulatory Visit

## 2020-10-10 ENCOUNTER — Telehealth (HOSPITAL_COMMUNITY): Payer: Self-pay

## 2020-10-10 LAB — CBC WITH DIFFERENTIAL/PLATELET
Abs Immature Granulocytes: 0.04 10*3/uL (ref 0.00–0.07)
Basophils Absolute: 0 10*3/uL (ref 0.0–0.1)
Basophils Relative: 0 %
Eosinophils Absolute: 0.1 10*3/uL (ref 0.0–0.5)
Eosinophils Relative: 1 %
HCT: 31.7 % — ABNORMAL LOW (ref 39.0–52.0)
Hemoglobin: 10 g/dL — ABNORMAL LOW (ref 13.0–17.0)
Immature Granulocytes: 1 %
Lymphocytes Relative: 26 %
Lymphs Abs: 2 10*3/uL (ref 0.7–4.0)
MCH: 32.9 pg (ref 26.0–34.0)
MCHC: 31.5 g/dL (ref 30.0–36.0)
MCV: 104.3 fL — ABNORMAL HIGH (ref 80.0–100.0)
Monocytes Absolute: 0.5 10*3/uL (ref 0.1–1.0)
Monocytes Relative: 7 %
Neutro Abs: 5 10*3/uL (ref 1.7–7.7)
Neutrophils Relative %: 65 %
Platelets: 155 10*3/uL (ref 150–400)
RBC: 3.04 MIL/uL — ABNORMAL LOW (ref 4.22–5.81)
RDW: 12.7 % (ref 11.5–15.5)
WBC: 7.7 10*3/uL (ref 4.0–10.5)
nRBC: 0 % (ref 0.0–0.2)

## 2020-10-10 LAB — MAGNESIUM: Magnesium: 1.4 mg/dL — ABNORMAL LOW (ref 1.7–2.4)

## 2020-10-10 LAB — COMPREHENSIVE METABOLIC PANEL
ALT: 9 U/L (ref 0–44)
AST: 11 U/L — ABNORMAL LOW (ref 15–41)
Albumin: 2.2 g/dL — ABNORMAL LOW (ref 3.5–5.0)
Alkaline Phosphatase: 49 U/L (ref 38–126)
Anion gap: 6 (ref 5–15)
BUN: 22 mg/dL (ref 8–23)
CO2: 22 mmol/L (ref 22–32)
Calcium: 8.5 mg/dL — ABNORMAL LOW (ref 8.9–10.3)
Chloride: 114 mmol/L — ABNORMAL HIGH (ref 98–111)
Creatinine, Ser: 1.81 mg/dL — ABNORMAL HIGH (ref 0.61–1.24)
GFR, Estimated: 39 mL/min — ABNORMAL LOW (ref >60)
Glucose, Bld: 119 mg/dL — ABNORMAL HIGH (ref 70–99)
Potassium: 3.9 mmol/L (ref 3.5–5.1)
Sodium: 142 mmol/L (ref 135–145)
Total Bilirubin: 0.7 mg/dL (ref 0.3–1.2)
Total Protein: 5.8 g/dL — ABNORMAL LOW (ref 6.5–8.1)

## 2020-10-10 LAB — PHOSPHORUS: Phosphorus: 1.6 mg/dL — ABNORMAL LOW (ref 2.5–4.6)

## 2020-10-10 MED ORDER — MAGNESIUM SULFATE 4 GM/100ML IV SOLN
4.0000 g | Freq: Once | INTRAVENOUS | Status: AC
  Administered 2020-10-10: 4 g via INTRAVENOUS
  Filled 2020-10-10: qty 100

## 2020-10-10 MED ORDER — DEXTROSE 5 % IV SOLN
30.0000 mmol | Freq: Once | INTRAVENOUS | Status: AC
  Administered 2020-10-10: 30 mmol via INTRAVENOUS
  Filled 2020-10-10 (×2): qty 10

## 2020-10-10 NOTE — Progress Notes (Signed)
Initial Nutrition Assessment  DOCUMENTATION CODES:  Not applicable  INTERVENTION:  Increase Ensure Enlive from BID to TID.  Add Magic cup TID with meals, each supplement provides 290 kcal and 9 grams of protein.  Add MVI with minerals daily.  Encourage PO intake at meals and with supplements.  NUTRITION DIAGNOSIS:  Inadequate oral intake related to acute illness (recent COVID-19 infection) as evidenced by per patient/family report.  GOAL:  Patient will meet greater than or equal to 90% of their needs  MONITOR:  PO intake, Supplement acceptance, Labs, Weight trends, I & O's  REASON FOR ASSESSMENT:  Malnutrition Screening Tool    ASSESSMENT:  75 yo male with a PMH of HFrEF, HTN, BPH, Crohn's disease, CKD 3 B, and ICD who presents from home via the [place] clinic for hypokalemia. Tested positive for COVID-19 10 days ago.  RD working remotely. Attempted to speak with pt by calling room phone. Pt did not answer.  Per H&P, "States he had rhinorrhea and decreased smell and taste but respiratory problems. He developed diarrhea 4-5 days ago. Has had multiple bouts daily of watery nonbloody diarrhea. He states he has been very weak the past 2 days and is not able to get up if he sits or lays down.He spent a few hours sitting on the ground in his yard when he was outside and came very weak and sat down.  A passerby saw him and helped him back into his house.  Has had people come by and check on him at home and they have had to help him get up out of a chair or out of bed as he was not able to do it on his own.  As his weakness did not improve he decided to go to the [place] clinic this morning and there he was told his potassium and calcium were low so he was sent to the emergency room."  Pt with no meal documentation in Epic.  Over the past two months, pt has lost 9 lbs (5.2%), which is not necessarily significant for the time frame, but it is still concerning.  Suspect patient is malnourished, but  RD cannot definitively diagnose at this time.  Recommend increasing Ensure Enlive BID to TID and adding Magic Cup TID and MVI with minerals daily to promote intake.  Medications: reviewed; Depakote ER, EE BID, ferrous sulfate, Mag-Ox, Remeron, Protonix, Vitamin B12, LR @ 10 ml/hr  Labs: reviewed; Glucose 119 (H), Phos 1.6 (L)  NUTRITION - FOCUSED PHYSICAL EXAM: Unable to perform  Diet Order:   Diet Order             Diet Heart Room service appropriate? Yes; Fluid consistency: Thin  Diet effective now                  EDUCATION NEEDS:  Education needs have been addressed  Skin:  Skin Assessment: Reviewed RN Assessment  Last BM:  10/09/20  Height:  Ht Readings from Last 1 Encounters:  10/09/20 6' 4"  (1.93 m)   Weight:  Wt Readings from Last 1 Encounters:  10/10/20 73.4 kg   BMI:  Body mass index is 19.7 kg/m.  Estimated Nutritional Needs:  Kcal:  2100-2300 Protein:  85-100 grams Fluid:  >2.1 L  Derrel Nip, RD, LDN (she/her/hers) Registered Dietitian I After-Hours/Weekend Pager # in [place]

## 2020-10-10 NOTE — Social Work (Signed)
Brent [name] 04-20-1945  Please be advised that the above-named patient will require a short-term nursing home stay - anticipated 30 days or less for rehabilitation and strengthening.  The plan is for return home.

## 2020-10-10 NOTE — Progress Notes (Signed)
PROGRESS NOTE    Brent [name]  ZYS:[id number] DOB: April 07, 1945 DOA: 10/08/2020 PCP: Wardell Honour, MD   Chief Complaint  Patient presents with   Abnormal Lab   Brief Narrative: 38 old male with history of diastolic CHF, hypertension, CKD stage IIIb, Crohn's disease, on chronic anticoagulant Eliquis, ICD in place follows up with [place] [facility] was seen at the clinic found to have low potassium and generalized weakness and referred to the ED for admission.  He recently had COVID 10 days prior to admission, and also developed diarrhea 4 to 5 days prior to admission with multiple watery nonbloody diarrhea. Patient was admitted for hypokalemia and hypocalcemia.  Subjective: Seen this morning.  He reports his weakness is some better.  No diarrhea here.  Not eating very well. No other new complaints. Assessment & Plan:  Multiple electrolytes imbalance with hypokalemia hypomagnesemia and hypophosphatemia: Potassium is stable but he still have low magnesium and phosphorus and will replete aggressively IV sodium Phos and magnesium sulfate, p.o. magnesium and repeat labs in AM.  Likely from his recent diarrhea due to his Crohn's disease.  Also with poor oral intake in light of recent COVID +.  Encourage oral intake dietitian following  Anemia of chronic kidney disease: Hemoglobin stable 10 to 11 g monitor.  Continue iron supplementation Recent Labs  Lab 10/08/20 1855 10/10/20 0154  HGB 11.6* 10.0*  HCT 37.1* 31.7*     Chronic systolic CHF with EF 93-23% in 2021  ICD in place  On chronic eliquis Essential hypertension Volume status appears stable and euvolemic.  Holding Lasix due to recent diarrhea electrolyte imbalance.  Continue Imdur, metoprolol, Imdur ,. Fraxiga, ivarbradine as well as Eliquis.Monitor intake output Daily weight. Net IO Since Admission: -252.17 mL [10/10/20 1140]  Filed Weights   10/09/20 2109 10/10/20 0500  Weight: 74 kg 73.4 kg    Generalized weakness due to multiple  electrolyte imbalance.  PT OT to continue, he will need a skilled nursing facility once he is stable  CKD stage IIIb: Renal function appears stable at baseline at 1.8 this morning.  Monitor. Recent Labs  Lab 10/08/20 1855 10/09/20 0435 10/10/20 0154  BUN 29* 27* 22  CREATININE 2.24* 2.06* 1.81*    COVID-19 infection recently -was positive August for. off isolation.  History of Crohn's disease continue Entocort, Remicade.  Has had recent nonbloody diarrhea.  Reports he gets diarrhea on and off.  Bipolar disorder on Depakote Seroquel and Remeron  Gout continue allopurinol  Diet Order             Diet Heart Room service appropriate? Yes; Fluid consistency: Thin  Diet effective now                   Nutrition Problem: Inadequate oral intake Etiology: acute illness (recent COVID-19 infection) Signs/Symptoms: per patient/family report Interventions: Ensure Enlive (each supplement provides 350kcal and 20 grams of protein), Magic cup, MVI Patient's Body mass index is 19.7 kg/m. DVT prophylaxis:   Continue Eliquis Code Status:   Code Status: Full Code  Family Communication: plan of care discussed with patient at bedside. Status is: Inpatient  Remains inpatient appropriate because:Unsafe d/c plan and Inpatient level of care appropriate due to severity of illness  Dispo: The patient is from: Home lives alone              Anticipated d/c is to: SNF              Patient currently is not  medically stable to d/c.   Difficult to place patient No       Unresulted Labs (From admission, onward)    None       Medications reviewed:  Scheduled Meds:  allopurinol  200 mg Oral Daily   apixaban  5 mg Oral BID   budesonide  9 mg Oral Q breakfast   dapagliflozin propanediol  10 mg Oral QAC breakfast   darifenacin  7.5 mg Oral Daily   divalproex  1,500 mg Oral QHS   feeding supplement  237 mL Oral BID BM   ferrous sulfate  325 mg Oral BID WC   isosorbide mononitrate  60 mg  Oral Daily   ivabradine  5 mg Oral BID WC   magnesium oxide  400 mg Oral Daily   metoprolol succinate  25 mg Oral BID   mirtazapine  7.5 mg Oral QHS   pantoprazole  40 mg Oral Daily   QUEtiapine  150 mg Oral QHS   tamsulosin  0.4 mg Oral Daily   vitamin B-12  1,000 mcg Oral Daily   Continuous Infusions:  lactated ringers Stopped (10/09/20 1452)   magnesium sulfate bolus IVPB     sodium phosphate  Dextrose 5% IVPB     Consultants:see note  Procedures:see note Antimicrobials: Anti-infectives (From admission, onward)    None      Culture/Microbiology    Component Value Date/Time   SDES BLOOD LEFT HAND 05/05/2018 0815   SPECREQUEST  05/05/2018 0815    BOTTLES DRAWN AEROBIC AND ANAEROBIC Blood Culture adequate volume   CULT  05/05/2018 0815    NO GROWTH 5 DAYS Performed at [facility], [place] [address], [place], [place] [address]    REPTSTATUS 05/10/2018 FINAL 05/05/2018 0815    Other culture-see note  Objective: Vitals: Today's Vitals   10/10/20 0028 10/10/20 0500 10/10/20 0517 10/10/20 0857  BP: 134/88  138/82   Pulse: 85  88   Resp: 18  18   Temp: 97.6 F (36.4 C)  97.8 F (36.6 C)   TempSrc: Oral  Oral   SpO2: 98%  96%   Weight:  73.4 kg    Height:      PainSc:    0-No pain    Intake/Output Summary (Last 24 hours) at 10/10/2020 1140 Last data filed at 10/10/2020 0518 Gross per 24 hour  Intake 122.83 ml  Output 375 ml  Net -252.17 ml   Filed Weights   10/09/20 2109 10/10/20 0500  Weight: 74 kg 73.4 kg   Weight change:   Intake/Output from previous day: 08/16 0701 - 08/17 0700 In: 122.8 [I.V.:122.8] Out: 375 [Urine:375] Intake/Output this shift: No intake/output data recorded. Filed Weights   10/09/20 2109 10/10/20 0500  Weight: 74 kg 73.4 kg   Examination: General exam: AAOx3,older than stated age, weak appearing. HEENT:Oral mucosa moist, Ear/Nose WNL grossly,dentition normal. Respiratory system: bilaterally diminished, no use of  accessory muscle, non tender. Cardiovascular system: S1 & S2 +,No JVD. Gastrointestinal system: Abdomen soft, NT,ND, BS+. Nervous System:Alert, awake, moving extremities Extremities: no edema, distal peripheral pulses palpable.  Skin: No rashes,no icterus. MSK: Normal muscle bulk,tone, power.  Data Reviewed: I have personally reviewed following labs and imaging studies CBC: Recent Labs  Lab 10/08/20 1855 10/10/20 0154  WBC 12.0* 7.7  NEUTROABS 8.5* 5.0  HGB 11.6* 10.0*  HCT 37.1* 31.7*  MCV 106.3* 104.3*  PLT 222 280   Basic Metabolic Panel: Recent Labs  Lab 10/08/20 1855 10/09/20 0435 10/10/20 0154  NA 142 141 142  K 2.7* 3.3* 3.9  CL 108 111 114*  CO2 25 23 22   GLUCOSE 114* 94 119*  BUN 29* 27* 22  CREATININE 2.24* 2.06* 1.81*  CALCIUM 8.5* 8.5* 8.5*  MG 1.3* 1.7 1.4*  PHOS  --   --  1.6*   GFR: Estimated Creatinine Clearance: 36.6 mL/min (A) (by C-G formula based on SCr of 1.81 mg/dL (H)). Liver Function Tests: Recent Labs  Lab 10/08/20 1855 10/09/20 0439 10/10/20 0154  AST 13*  --  11*  ALT 10  --  9  ALKPHOS 67  --  49  BILITOT 0.5  --  0.7  PROT 6.9  --  5.8*  ALBUMIN 2.7* 2.5* 2.2*   No results for input(s): LIPASE, AMYLASE in the last 168 hours. No results for input(s): AMMONIA in the last 168 hours. Coagulation Profile: No results for input(s): INR, PROTIME in the last 168 hours. Cardiac Enzymes: No results for input(s): CKTOTAL, CKMB, CKMBINDEX, TROPONINI in the last 168 hours. BNP (last 3 results) No results for input(s): PROBNP in the last 8760 hours. HbA1C: No results for input(s): HGBA1C in the last 72 hours. CBG: No results for input(s): GLUCAP in the last 168 hours. Lipid Profile: No results for input(s): CHOL, HDL, LDLCALC, TRIG, CHOLHDL, LDLDIRECT in the last 72 hours. Thyroid Function Tests: No results for input(s): TSH, T4TOTAL, FREET4, T3FREE, THYROIDAB in the last 72 hours. Anemia Panel: No results for input(s): VITAMINB12,  FOLATE, FERRITIN, TIBC, IRON, RETICCTPCT in the last 72 hours. Sepsis Labs: No results for input(s): PROCALCITON, LATICACIDVEN in the last 168 hours.  Recent Results (from the past 240 hour(s))  Resp Panel by RT-PCR (Flu A&B, Covid) Nasopharyngeal Swab     Status: None   Collection Time: 10/08/20 10:44 PM   Specimen: Nasopharyngeal Swab; Nasopharyngeal(NP) swabs in vial transport medium  Result Value Ref Range Status   SARS Coronavirus 2 by RT PCR NEGATIVE NEGATIVE Final    Comment: (NOTE) SARS-CoV-2 target nucleic acids are NOT DETECTED.  The SARS-CoV-2 RNA is generally detectable in upper respiratory specimens during the acute phase of infection. The lowest concentration of SARS-CoV-2 viral copies this assay can detect is 138 copies/mL. A negative result does not preclude SARS-Cov-2 infection and should not be used as the sole basis for treatment or other patient management decisions. A negative result may occur with  improper specimen collection/handling, submission of specimen other than nasopharyngeal swab, presence of viral mutation(s) within the areas targeted by this assay, and inadequate number of viral copies(<138 copies/mL). A negative result must be combined with clinical observations, patient history, and epidemiological information. The expected result is Negative.  Fact Sheet for Patients:  EntrepreneurPulse.com.au  Fact Sheet for Healthcare Providers:  IncredibleEmployment.be  This test is no t yet approved or cleared by the [place] FDA and  has been authorized for detection and/or diagnosis of SARS-CoV-2 by FDA under an Emergency Use Authorization (EUA). This EUA will remain  in effect (meaning this test can be used) for the duration of the COVID-19 declaration under Section 564(b)(1) of the Act, 21 U.S.C.section 360bbb-3(b)(1), unless the authorization is terminated  or revoked sooner.       Influenza A by PCR  NEGATIVE NEGATIVE Final   Influenza B by PCR NEGATIVE NEGATIVE Final    Comment: (NOTE) The Xpert Xpress SARS-CoV-2/FLU/RSV plus assay is intended as an aid in the diagnosis of influenza from Nasopharyngeal swab specimens and should not be used as a sole  basis for treatment. Nasal washings and aspirates are unacceptable for Xpert Xpress SARS-CoV-2/FLU/RSV testing.  Fact Sheet for Patients: EntrepreneurPulse.com.au  Fact Sheet for Healthcare Providers: IncredibleEmployment.be  This test is not yet approved or cleared by the [place] FDA and has been authorized for detection and/or diagnosis of SARS-CoV-2 by FDA under an Emergency Use Authorization (EUA). This EUA will remain in effect (meaning this test can be used) for the duration of the COVID-19 declaration under Section 564(b)(1) of the Act, 21 U.S.C. section 360bbb-3(b)(1), unless the authorization is terminated or revoked.  Performed at [facility], [place] [address] [place], [place] [address]      Radiology Studies: DG Chest Portable 1 View  Result Date: 10/08/2020 CLINICAL DATA:  Weakness EXAM: PORTABLE CHEST 1 VIEW COMPARISON:  08/14/2020 FINDINGS: Left-sided pacing device. No focal opacity or pleural effusion. Normal cardiomediastinal silhouette. No pneumothorax IMPRESSION: No active disease. Electronically Signed   By: Donavan Foil M.D.   On: 10/08/2020 20:56     LOS: 2 days   Antonieta Pert, MD [facility]  10/10/2020, 11:40 AM

## 2020-10-10 NOTE — Telephone Encounter (Signed)
Spoke to [name] who informed me he is still admitted and plan is to go to SNF for short term rehab. I advised him to keep me updated and we will resume paramedicine in the home upon his return. Call complete.

## 2020-10-10 NOTE — Progress Notes (Signed)
   10/10/20 1453  Clinical Encounter Type  Visited With Patient and family together  Visit Type Follow-up  Referral From Nurse  Consult/Referral To Chaplain   Chaplain responded to page for AD assistance. Pt's friend, Basilia Jumbo, was at bedside. Chaplain answered questions about AD and assisted in filling it out, per Pt's request. Paperwork is completed and chaplain will work with tomorrow's chaplain on scheduling a notary appointment for Thursday. If no update by noon tomorrow, please page again.    This note was prepared by Chaplain Resident, Dante Gang, MDiv. Chaplain remains available as needed through the on-call pager: [phone].

## 2020-10-10 NOTE — NC FL2 (Signed)
[place] LEVEL OF CARE SCREENING TOOL     IDENTIFICATION  Patient Name: Brent [name] Birthdate: 1945/05/16 Sex: male Admission Date (Current Location): 10/08/2020  [facility] and [place] Number:  Herbalist and Address:  [facility], [place] [address], [place], [place] [address]      Provider Number: [id number]  Attending Physician Name and Address:  Antonieta Pert, MD  Relative Name and Phone Number:       Current Level of Care: Hospital Recommended Level of Care: [place] Prior Approval Number:    Date Approved/Denied:   PASRR Number: pending  Discharge Plan: Home    Current Diagnoses: Patient Active Problem List   Diagnosis Date Noted   Hypokalemia 10/08/2020   Hypomagnesemia 10/08/2020   CKD (chronic kidney disease), stage III ([place]) 10/08/2020   COVID-19 virus infection 10/08/2020   Electrolyte abnormality 05/17/2020   ICD (implantable cardioverter-defibrillator) in place 73/41/9379   Chronic systolic heart failure ([place]) 01/02/2020   Decreased appetite 10/08/2019   Weakness 10/08/2019   Hyperkalemia 02/40/9735   Chronic systolic CHF (congestive heart failure) ([place]) 08/26/2019   Type 2 diabetes mellitus with stage 3 chronic kidney disease ([place]) 08/26/2019   Chronic obstructive pulmonary disease ([place]) 06/20/2019   Ventricular tachycardia ([place]) 06/20/2019   Acute on chronic systolic (congestive) heart failure ([place]) 32/99/2426   Metabolic acidosis 83/41/9622   Orthostasis 09/07/2018   AKI (acute kidney injury) ([place])    Immunosuppressed status ([place])    Macrocytic anemia    Chronic combined systolic and diastolic congestive heart failure ([place]) 06/03/2018   Candida esophagitis ([place]) 09/22/2017   History of smoking 30 or more pack years 01/05/2017   Bipolar 1 disorder ([place])    BPH (benign prostatic hyperplasia) 03/31/2013   Anemia in chronic kidney disease 03/31/2013   Essential hypertension, benign  03/31/2013   Acute renal failure superimposed on stage 3 chronic kidney disease ([place]) 06/04/2012   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[place]) 01/23/2010    Orientation RESPIRATION BLADDER Height & Weight     Time, Self, Situation, Place  Normal Incontinent Weight: 161 lb 13.1 oz (73.4 kg) Height:  6' 4"  (193 cm)  BEHAVIORAL SYMPTOMS/MOOD NEUROLOGICAL BOWEL NUTRITION STATUS      Incontinent Diet  AMBULATORY STATUS COMMUNICATION OF NEEDS Skin   Extensive Assist Verbally Normal                       Personal Care Assistance Level of Assistance  Bathing, Dressing, Feeding Bathing Assistance: Limited assistance Feeding assistance: Independent Dressing Assistance: Limited assistance     Functional Limitations Info  Sight, Hearing, Speech Sight Info: Adequate Hearing Info: Adequate Speech Info: Adequate    SPECIAL CARE FACTORS FREQUENCY  PT (By licensed PT), OT (By licensed OT)     PT Frequency: 5x a week OT Frequency: 5x a week            Contractures Contractures Info: Not present    Additional Factors Info  Code Status, Allergies Code Status Info: Full Allergies Info: Azathioprine   Ciprofloxacin   Levaquin (Levofloxacin In D5w)   Plendil (Felodipine)           Current Medications (10/10/2020):  This is the current hospital active medication list Current Facility-Administered Medications  Medication Dose Route Frequency Provider Last Rate Last Admin   acetaminophen (TYLENOL) tablet 650 mg  650 mg Oral Q6H PRN Chotiner, Yevonne Aline, MD       Or  acetaminophen (TYLENOL) suppository 650 mg  650 mg Rectal Q6H PRN Chotiner, Yevonne Aline, MD       allopurinol (ZYLOPRIM) tablet 200 mg  200 mg Oral Daily Chotiner, Yevonne Aline, MD   200 mg at 10/10/20 1050   apixaban (ELIQUIS) tablet 5 mg  5 mg Oral BID Chotiner, Yevonne Aline, MD   5 mg at 10/10/20 1051   budesonide (ENTOCORT EC) 24 hr capsule 9 mg  9 mg Oral Q breakfast Chotiner, Yevonne Aline, MD   9 mg at 10/10/20 0850   dapagliflozin  propanediol (FARXIGA) tablet 10 mg  10 mg Oral QAC breakfast Chotiner, Yevonne Aline, MD   10 mg at 10/10/20 0851   darifenacin (ENABLEX) 24 hr tablet 7.5 mg  7.5 mg Oral Daily Chotiner, Yevonne Aline, MD   7.5 mg at 10/10/20 1051   divalproex (DEPAKOTE ER) 24 hr tablet 1,500 mg  1,500 mg Oral QHS Chotiner, Yevonne Aline, MD   1,500 mg at 10/09/20 2341   feeding supplement (ENSURE ENLIVE / ENSURE PLUS) liquid 237 mL  237 mL Oral BID BM Elodia Florence., MD   237 mL at 10/10/20 1052   ferrous sulfate tablet 325 mg  325 mg Oral BID WC Chotiner, Yevonne Aline, MD   325 mg at 10/10/20 0849   guaiFENesin-dextromethorphan (ROBITUSSIN DM) 100-10 MG/5ML syrup 5 mL  5 mL Oral Q4H PRN Elodia Florence., MD   5 mL at 10/10/20 1319   isosorbide mononitrate (IMDUR) 24 hr tablet 60 mg  60 mg Oral Daily Chotiner, Yevonne Aline, MD   60 mg at 10/10/20 1051   ivabradine (CORLANOR) tablet 5 mg  5 mg Oral BID WC Chotiner, Yevonne Aline, MD   5 mg at 10/10/20 2458   lactated ringers infusion   Intravenous Continuous Chotiner, Yevonne Aline, MD   Stopped at 10/09/20 1452   magnesium oxide (MAG-OX) tablet 400 mg  400 mg Oral Daily Chotiner, Yevonne Aline, MD   400 mg at 10/10/20 1051   metoprolol succinate (TOPROL-XL) 24 hr tablet 25 mg  25 mg Oral BID Chotiner, Yevonne Aline, MD   25 mg at 10/10/20 1051   mirtazapine (REMERON) tablet 7.5 mg  7.5 mg Oral QHS Chotiner, Yevonne Aline, MD   7.5 mg at 10/09/20 2246   ondansetron (ZOFRAN) tablet 4 mg  4 mg Oral Q6H PRN Chotiner, Yevonne Aline, MD       Or   ondansetron (ZOFRAN) injection 4 mg  4 mg Intravenous Q6H PRN Chotiner, Yevonne Aline, MD       pantoprazole (PROTONIX) EC tablet 40 mg  40 mg Oral Daily Elodia Florence., MD   40 mg at 10/10/20 1050   QUEtiapine (SEROQUEL) tablet 150 mg  150 mg Oral QHS Chotiner, Yevonne Aline, MD   150 mg at 10/09/20 2246   sodium phosphate 30 mmol in dextrose 5 % 250 mL infusion  30 mmol Intravenous Once Kc, Maren [place], MD       tamsulosin (FLOMAX) capsule 0.4 mg  0.4 mg  Oral Daily Chotiner, Yevonne Aline, MD   0.4 mg at 10/10/20 1050   vitamin B-12 (CYANOCOBALAMIN) tablet 1,000 mcg  1,000 mcg Oral Daily Elodia Florence., MD   1,000 mcg at 10/10/20 1050     Discharge Medications: Please see discharge summary for a list of discharge medications.  Relevant Imaging Results:  Relevant Lab Results:   Additional Information SS#: [id number] covid vaccinated with booster  Emeterio Reeve, LCSW

## 2020-10-10 NOTE — Progress Notes (Signed)
   10/10/20 0943  Clinical Encounter Type  Visited With Patient  Visit Type Initial  Referral From Nurse  Consult/Referral To Chaplain   Chaplain responded to consult. Chaplain provided Advance Directive education and engaged active listening. Pt did not have any questions at this time. Chaplain remains available.  This note was prepared by Chaplain Resident, Dante Gang, MDiv. Chaplain remains available as needed through the on-call pager: [phone].

## 2020-10-10 NOTE — Discharge Instructions (Addendum)
Information on my medicine - ELIQUIS (apixaban)   Why was Eliquis prescribed for you? Eliquis was prescribed for you to reduce the risk of forming blood clots that can cause a stroke if you have a medical condition called atrial fibrillation (a type of irregular heartbeat) OR to reduce the risk of a blood clots forming after orthopedic surgery.  What do You need to know about Eliquis ? Take your Eliquis TWICE DAILY - one tablet in the morning and one tablet in the evening with or without food.  It would be best to take the doses about the same time each day.  If you have difficulty swallowing the tablet whole please discuss with your pharmacist how to take the medication safely.  Take Eliquis exactly as prescribed by your doctor and DO NOT stop taking Eliquis without talking to the doctor who prescribed the medication.  Stopping may increase your risk of developing a new clot or stroke.  Refill your prescription before you run out.  After discharge, you should have regular check-up appointments with your healthcare provider that is prescribing your Eliquis.  In the future your dose may need to be changed if your kidney function or weight changes by a significant amount or as you get older.  What do you do if you miss a dose? If you miss a dose, take it as soon as you remember on the same day and resume taking twice daily.  Do not take more than one dose of ELIQUIS at the same time.  Important Safety Information A possible side effect of Eliquis is bleeding. You should call your healthcare provider right away if you experience any of the following: Bleeding from an injury or your nose that does not stop. Unusual colored urine (red or dark brown) or unusual colored stools (red or black). Unusual bruising for unknown reasons. A serious fall or if you hit your head (even if there is no bleeding).  Some medicines may interact with Eliquis and might increase your risk of bleeding or  clotting while on Eliquis. To help avoid this, consult your healthcare provider or pharmacist prior to using any new prescription or non-prescription medications, including herbals, vitamins, non-steroidal anti-inflammatory drugs (NSAIDs) and supplements.  This website has more information on Eliquis (apixaban): [email or web address].no.   Additional discharge instructions  Please get your medications reviewed and adjusted by your Primary MD.  Please request your Primary MD to go over all Hospital Tests and Procedure/Radiological results at the follow up, please get all Hospital records sent to your Prim MD by signing hospital release before you go home.  If you had Pneumonia of Lung problems at the Hospital: Please get a 2 view Chest X ray done in 6-8 weeks after hospital discharge or sooner if instructed by your Primary MD.  If you have Congestive Heart Failure: Please call your Cardiologist or Primary MD anytime you have any of the following symptoms:  1) 3 pound weight gain in 24 hours or 5 pounds in 1 week  2) shortness of breath, with or without a dry hacking cough  3) swelling in the hands, feet or stomach  4) if you have to sleep on extra pillows at night in order to breathe  Follow cardiac low salt diet and 1.5 lit/day fluid restriction.  If you have diabetes Accuchecks 4 times/day, Once in AM empty stomach and then before each meal. Log in all results and show them to your primary doctor at your next visit. If any glucose  reading is under 80 or above 300 call your primary MD immediately.  If you have Seizure/Convulsions/Epilepsy: Please do not drive, operate heavy machinery, participate in activities at heights or participate in high speed sports until you have seen by Primary MD or a Neurologist and advised to do so again.  If you had Gastrointestinal Bleeding: Please ask your Primary MD to check a complete blood count within one week of discharge or at your next visit. Your  endoscopic/colonoscopic biopsies that are pending at the time of discharge, will also need to followed by your Primary MD.  Get Medicines reviewed and adjusted. Please take all your medications with you for your next visit with your Primary MD  Please request your Primary MD to go over all hospital tests and procedure/radiological results at the follow up, please ask your Primary MD to get all Hospital records sent to his/her office.  If you experience worsening of your admission symptoms, develop shortness of breath, life threatening emergency, suicidal or homicidal thoughts you must seek medical attention immediately by calling 911 or calling your MD immediately  if symptoms less severe.  You must read complete instructions/literature along with all the possible adverse reactions/side effects for all the Medicines you take and that have been prescribed to you. Take any new Medicines after you have completely understood and accpet all the possible adverse reactions/side effects.   Do not drive or operate heavy machinery when taking Pain medications.   Do not take more than prescribed Pain, Sleep and Anxiety Medications  Special Instructions: If you have smoked or chewed Tobacco  in the last 2 yrs please stop smoking, stop any regular Alcohol  and or any Recreational drug use.  Wear Seat belts while driving.  Please note You were cared for by a hospitalist during your hospital stay. If you have any questions about your discharge medications or the care you received while you were in the hospital after you are discharged, you can call the unit and asked to speak with the hospitalist on call if the hospitalist that took care of you is not available. Once you are discharged, your primary care physician will handle any further medical issues. Please note that NO REFILLS for any discharge medications will be authorized once you are discharged, as it is imperative that you return to your primary care  physician (or establish a relationship with a primary care physician if you do not have one) for your aftercare needs so that they can reassess your need for medications and monitor your lab values.  You can reach the hospitalist office at phone [phone] or fax [phone]   If you do not have a primary care physician, you can call [phone] for a physician referral.

## 2020-10-11 LAB — CBC
HCT: 30.8 % — ABNORMAL LOW (ref 39.0–52.0)
Hemoglobin: 9.9 g/dL — ABNORMAL LOW (ref 13.0–17.0)
MCH: 33.9 pg (ref 26.0–34.0)
MCHC: 32.1 g/dL (ref 30.0–36.0)
MCV: 105.5 fL — ABNORMAL HIGH (ref 80.0–100.0)
Platelets: 147 10*3/uL — ABNORMAL LOW (ref 150–400)
RBC: 2.92 MIL/uL — ABNORMAL LOW (ref 4.22–5.81)
RDW: 12.8 % (ref 11.5–15.5)
WBC: 8.4 10*3/uL (ref 4.0–10.5)
nRBC: 0 % (ref 0.0–0.2)

## 2020-10-11 LAB — MAGNESIUM: Magnesium: 2 mg/dL (ref 1.7–2.4)

## 2020-10-11 LAB — BASIC METABOLIC PANEL
Anion gap: 10 (ref 5–15)
BUN: 19 mg/dL (ref 8–23)
CO2: 25 mmol/L (ref 22–32)
Calcium: 8.9 mg/dL (ref 8.9–10.3)
Chloride: 109 mmol/L (ref 98–111)
Creatinine, Ser: 1.92 mg/dL — ABNORMAL HIGH (ref 0.61–1.24)
GFR, Estimated: 36 mL/min — ABNORMAL LOW (ref >60)
Glucose, Bld: 101 mg/dL — ABNORMAL HIGH (ref 70–99)
Potassium: 3.9 mmol/L (ref 3.5–5.1)
Sodium: 144 mmol/L (ref 135–145)

## 2020-10-11 LAB — PHOSPHORUS: Phosphorus: 3.8 mg/dL (ref 2.5–4.6)

## 2020-10-11 NOTE — TOC Initial Note (Addendum)
Transition of Care [facility]) - Initial/Assessment Note    Patient Details  Name: Brent [name] MRN: [id number] Date of Birth: 03-Mar-1945  [facility] of [facility] CM/SW Contact:    Emeterio Reeve, LCSW Phone Number: 10/11/2020, 12:07 PM  Clinical Narrative:                  CSW received SNF consult. CSW met with pt at bedside. CSW introduced self and explained role at the hospital. Pt reports that PTA he lives at home alone. Pt reports he uses a hemi walker but is otherwise independent with mobility and ADLs.  CSW reviewed PT/OT recommendations for SNF. Pt reports he is fine with SNF. Pt gave CSW permission to fax out to facilities in the area. Pt has no preference of facility at this time. CSW gave pt medicare.gov rating list to review. CSW explained insurance auth process. Pt reports they are covid vaccinated with booster. Pt was ovid positive 8/04, but is no longer on covid restrictions.   CSW will continue to follow.   Expected Discharge Plan: Skilled Nursing Facility Barriers to Discharge: Continued Medical Work up, Ship broker   Patient Goals and CMS Choice Patient states their goals for this hospitalization and ongoing recovery are:: Tol get better CMS Medicare.gov Compare Post Acute Care list provided to:: Patient Choice offered to / list presented to : Patient  Expected Discharge Plan and Services Expected Discharge Plan: [place] arrangements for the past 2 months: [place]                                      Prior Living Arrangements/Services Living arrangements for the past 2 months: [place] Lives with:: Self Patient language and need for interpreter reviewed:: Yes Do you feel safe going back to the place where you live?: Yes      Need for Family Participation in Patient Care: Yes (Comment) Care giver support system in place?: Yes (comment) Current home services: DME Criminal  Activity/Legal Involvement Pertinent to Current Situation/Hospitalization: No - Comment as needed  Activities of Daily Living Home Assistive Devices/Equipment: Cane (specify quad or straight) ADL Screening (condition at time of admission) Patient's cognitive ability adequate to safely complete daily activities?: Yes Is the patient deaf or have difficulty hearing?: No Does the patient have difficulty seeing, even when wearing glasses/contacts?: No Does the patient have difficulty concentrating, remembering, or making decisions?: Yes Patient able to express need for assistance with ADLs?: Yes Does the patient have difficulty dressing or bathing?: Yes Independently performs ADLs?: No Communication: Independent Dressing (OT): Needs assistance Is this a change from baseline?: Change from baseline, expected to last <3days Grooming: Needs assistance Is this a change from baseline?: Change from baseline, expected to last <3 days Feeding: Independent Bathing: Needs assistance Is this a change from baseline?: Change from baseline, expected to last <3 days Toileting: Needs assistance Is this a change from baseline?: Change from baseline, expected to last <3 days In/Out Bed: Needs assistance Is this a change from baseline?: Change from baseline, expected to last <3 days Walks in Home: Independent, Needs assistance Is this a change from baseline?: Change from baseline, expected to last >3 days Does the patient have difficulty walking or climbing stairs?: No Weakness of Legs: Both Weakness of Arms/Hands: None  Permission Sought/Granted Permission sought to share information with :  Facility Art therapist granted to share information with : Yes, Verbal Permission Granted     Permission granted to share info w AGENCY: SNF        Emotional Assessment Appearance:: Appears stated age Attitude/Demeanor/Rapport: Engaged Affect (typically observed): Appropriate Orientation: :  Oriented to Self, Oriented to Place, Oriented to Situation, Oriented to  Time Alcohol / Substance Use: Not Applicable Psych Involvement: No (comment)  Admission diagnosis:  Hypokalemia [E87.6] Hypomagnesemia [E83.42] Weakness [R53.1] AKI (acute kidney injury) ([place]) [N17.9] Patient Active Problem List   Diagnosis Date Noted   Hypokalemia 10/08/2020   Hypomagnesemia 10/08/2020   CKD (chronic kidney disease), stage III ([place]) 10/08/2020   COVID-19 virus infection 10/08/2020   Electrolyte abnormality 05/17/2020   ICD (implantable cardioverter-defibrillator) in place 96/28/3662   Chronic systolic heart failure ([place]) 01/02/2020   Decreased appetite 10/08/2019   Weakness 10/08/2019   Hyperkalemia 94/76/5465   Chronic systolic CHF (congestive heart failure) ([place]) 08/26/2019   Type 2 diabetes mellitus with stage 3 chronic kidney disease ([place]) 08/26/2019   Chronic obstructive pulmonary disease ([place]) 06/20/2019   Ventricular tachycardia ([place]) 06/20/2019   Acute on chronic systolic (congestive) heart failure ([place]) 03/54/6568   Metabolic acidosis 12/75/1700   Orthostasis 09/07/2018   AKI (acute kidney injury) ([place])    Immunosuppressed status ([place])    Macrocytic anemia    Chronic combined systolic and diastolic congestive heart failure ([place]) 06/03/2018   Candida esophagitis ([place]) 09/22/2017   History of smoking 30 or more pack years 01/05/2017   Bipolar 1 disorder ([place])    BPH (benign prostatic hyperplasia) 03/31/2013   Anemia in chronic kidney disease 03/31/2013   Essential hypertension, benign 03/31/2013   Acute renal failure superimposed on stage 3 chronic kidney disease ([place]) 06/04/2012   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[place]) 01/23/2010   PCP:  Wardell Honour, MD Pharmacy:   [facility] [phone] [address] [place], [place] - [place] [address] [place] [address] Phone: [phone] Fax: [phone]  [id number] - [place], [place] - [address] [place] [address] Phone: [phone] Fax: [place], [place] - [address] [place] [address] Phone: [phone] Fax: [phone]  [facility] - [place], [place] - [address] [place] [address] Phone: [phone] Fax: [phone]     Social Determinants of Health (SDOH) Interventions    Readmission Risk Interventions No flowsheet data found.  Emeterio Reeve, [place] Clinical Social Worker [phone]

## 2020-10-11 NOTE — Progress Notes (Signed)
PROGRESS NOTE    Brent [name]  KDX:[id number] DOB: 1946/02/21 DOA: 10/08/2020 PCP: Wardell Honour, MD   Chief Complaint  Patient presents with   Abnormal Lab   Brief Narrative: 67 old male with history of diastolic CHF, hypertension, CKD stage IIIb, Crohn's disease, on chronic anticoagulant Eliquis, ICD in place follows up with [facility] was seen at the clinic found to have low potassium and generalized weakness and referred to the ED for admission.  He recently had COVID 10 days prior to admission, and also developed diarrhea 4 to 5 days prior to admission with multiple watery nonbloody diarrhea. Patient was admitted for hypokalemia and hypocalcemia.Electrolytes were aggressively repleted and now stabilized and normal. Likely from his recent diarrhea due to his Crohn's disease,poor oral intake in light of recent COVID +.  Encourage oral hydration. At this time is medically stable for discharge to skilled nursing facility pending insurance  Subjective: Seen this morning he has no complaints.  Resting comfortably.  Mild cough.  Assessment & Plan:  Multiple electrolytes imbalance with hypokalemia hypomagnesemia and hypophosphatemia: Electrolytes were aggressively repleted and now stabilized and normal. Likely from his recent diarrhea due to his Crohn's disease,poor oral intake in light of recent COVID +.  Encourage oral hydration  Anemia of chronic kidney disease: Appears overall stable .  Continue iron supplementation Recent Labs  Lab 10/08/20 1855 10/10/20 0154 10/11/20 0342  HGB 11.6* 10.0* 9.9*  HCT 37.1* 31.7* 30.8*     Chronic systolic CHF with EF 97-67% in 2021  ICD in place  On chronic eliquis Essential hypertension Currently euvolemic.  Blood pressure stable.  Weight is stable, holding Lasix for now hopefully can resume tomorrow.Continue Imdur, metoprolol, Imdur ,Fraxiga, ivarbradine as well as Eliquis.Monitor intake output Daily weight. Net IO Since Admission: -217.18  mL [10/11/20 1133]  Filed Weights   10/09/20 2109 10/10/20 0500 10/11/20 0500  Weight: 74 kg 73.4 kg 73.7 kg    Generalized weakness due to multiple electrolyte imbalance.  Will need a skilled nursing facility.   CKD stage IIIb: Renal function appears stable at baseline at 1.8 - 1.9.  Will need close monitoring.  Recent Labs  Lab 10/08/20 1855 10/09/20 0435 10/10/20 0154 10/11/20 0342  BUN 29* 27* 22 19  CREATININE 2.24* 2.06* 1.81* 1.92*     COVID-19 infection recently -was positive August for. off isolation.  History of Crohn's disease continue Entocort, Remicade.  Has had recent nonbloody diarrhea.  Reports he gets diarrhea on and off.  Bipolar disorder on Depakote Seroquel and Remeron  Gout continue allopurinol  Diet Order             Diet Heart Room service appropriate? Yes with Assist; Fluid consistency: Thin  Diet effective now                   Nutrition Problem: Inadequate oral intake Etiology: acute illness (recent COVID-19 infection) Signs/Symptoms: per patient/family report Interventions: Ensure Enlive (each supplement provides 350kcal and 20 grams of protein), Magic cup, MVI Patient's Body mass index is 19.78 kg/m. DVT prophylaxis: Continue Eliquis Code Status:   Code Status: Full Code  Family Communication: plan of care discussed with patient at bedside. Status is: Inpatient  Remains inpatient appropriate because:Unsafe d/c plan and Inpatient level of care appropriate due to severity of illness  Dispo: The patient is from: Home lives alone              Anticipated d/c is to: SNF  Patient currently is medically stable   Difficult to place patient No       Unresulted Labs (From admission, onward)    None       Medications reviewed:  Scheduled Meds:  allopurinol  200 mg Oral Daily   apixaban  5 mg Oral BID   budesonide  9 mg Oral Q breakfast   dapagliflozin propanediol  10 mg Oral QAC breakfast   darifenacin  7.5 mg Oral  Daily   divalproex  1,500 mg Oral QHS   feeding supplement  237 mL Oral BID BM   ferrous sulfate  325 mg Oral BID WC   isosorbide mononitrate  60 mg Oral Daily   ivabradine  5 mg Oral BID WC   magnesium oxide  400 mg Oral Daily   metoprolol succinate  25 mg Oral BID   mirtazapine  7.5 mg Oral QHS   pantoprazole  40 mg Oral Daily   QUEtiapine  150 mg Oral QHS   tamsulosin  0.4 mg Oral Daily   vitamin B-12  1,000 mcg Oral Daily   Continuous Infusions:  lactated ringers Stopped (10/09/20 1452)   Consultants:see note  Procedures:see note Antimicrobials: Anti-infectives (From admission, onward)    None      Culture/Microbiology    Component Value Date/Time   SDES BLOOD LEFT HAND 05/05/2018 0815   SPECREQUEST  05/05/2018 0815    BOTTLES DRAWN AEROBIC AND ANAEROBIC Blood Culture adequate volume   CULT  05/05/2018 0815    NO GROWTH 5 DAYS Performed at [facility], [place]. [address], [place], [place] [address]    REPTSTATUS 05/10/2018 FINAL 05/05/2018 0815    Other culture-see note  Objective: Vitals: Today's Vitals   10/11/20 0355 10/11/20 0500 10/11/20 0804 10/11/20 0900  BP: 134/68  (!) 158/92 (!) 149/73  Pulse: 69  69 66  Resp:   20 18  Temp: 97.7 F (36.5 C)  97.7 F (36.5 C) (!) 97.4 F (36.3 C)  TempSrc: Oral  Oral Oral  SpO2: 98%  99% 93%  Weight:  73.7 kg    Height:      PainSc:   0-No pain     Intake/Output Summary (Last 24 hours) at 10/11/2020 1133 Last data filed at 10/11/2020 0300 Gross per 24 hour  Intake 409.99 ml  Output 375 ml  Net 34.99 ml    Filed Weights   10/09/20 2109 10/10/20 0500 10/11/20 0500  Weight: 74 kg 73.4 kg 73.7 kg   Weight change: -0.3 kg  Intake/Output from previous day: 08/17 0701 - 08/18 0700 In: 410 [P.O.:120; IV Piggyback:290] Out: 375 [Urine:375] Intake/Output this shift: No intake/output data recorded. Filed Weights   10/09/20 2109 10/10/20 0500 10/11/20 0500  Weight: 74 kg 73.4 kg 73.7 kg    Examination: General exam: AAOx 3, older than stated age, weak appearing. HEENT:Oral mucosa moist, Ear/Nose WNL grossly, dentition normal. Respiratory system: bilaterally diminished,  no use of accessory muscle Cardiovascular system: S1 & S2 +, No JVD,. Gastrointestinal system: Abdomen soft, NT,ND, BS+ Nervous System:Alert, awake, moving extremities and grossly nonfocal Extremities: no edema, distal peripheral pulses palpable.  Skin: No rashes,no icterus. MSK: Normal muscle bulk,tone, power   Data Reviewed: I have personally reviewed following labs and imaging studies CBC: Recent Labs  Lab 10/08/20 1855 10/10/20 0154 10/11/20 0342  WBC 12.0* 7.7 8.4  NEUTROABS 8.5* 5.0  --   HGB 11.6* 10.0* 9.9*  HCT 37.1* 31.7* 30.8*  MCV 106.3* 104.3* 105.5*  PLT  222 155 147*    Basic Metabolic Panel: Recent Labs  Lab 10/08/20 1855 10/09/20 0435 10/10/20 0154 10/11/20 0342  NA 142 141 142 144  K 2.7* 3.3* 3.9 3.9  CL 108 111 114* 109  CO2 25 23 22 25   GLUCOSE 114* 94 119* 101*  BUN 29* 27* 22 19  CREATININE 2.24* 2.06* 1.81* 1.92*  CALCIUM 8.5* 8.5* 8.5* 8.9  MG 1.3* 1.7 1.4* 2.0  PHOS  --   --  1.6* 3.8    GFR: Estimated Creatinine Clearance: 34.7 mL/min (A) (by C-G formula based on SCr of 1.92 mg/dL (H)). Liver Function Tests: Recent Labs  Lab 10/08/20 1855 10/09/20 0439 10/10/20 0154  AST 13*  --  11*  ALT 10  --  9  ALKPHOS 67  --  49  BILITOT 0.5  --  0.7  PROT 6.9  --  5.8*  ALBUMIN 2.7* 2.5* 2.2*    No results for input(s): LIPASE, AMYLASE in the last 168 hours. No results for input(s): AMMONIA in the last 168 hours. Coagulation Profile: No results for input(s): INR, PROTIME in the last 168 hours. Cardiac Enzymes: No results for input(s): CKTOTAL, CKMB, CKMBINDEX, TROPONINI in the last 168 hours. BNP (last 3 results) No results for input(s): PROBNP in the last 8760 hours. HbA1C: No results for input(s): HGBA1C in the last 72 hours. CBG: No results  for input(s): GLUCAP in the last 168 hours. Lipid Profile: No results for input(s): CHOL, HDL, LDLCALC, TRIG, CHOLHDL, LDLDIRECT in the last 72 hours. Thyroid Function Tests: No results for input(s): TSH, T4TOTAL, FREET4, T3FREE, THYROIDAB in the last 72 hours. Anemia Panel: No results for input(s): VITAMINB12, FOLATE, FERRITIN, TIBC, IRON, RETICCTPCT in the last 72 hours. Sepsis Labs: No results for input(s): PROCALCITON, LATICACIDVEN in the last 168 hours.  Recent Results (from the past 240 hour(s))  Resp Panel by RT-PCR (Flu A&B, Covid) Nasopharyngeal Swab     Status: None   Collection Time: 10/08/20 10:44 PM   Specimen: Nasopharyngeal Swab; Nasopharyngeal(NP) swabs in vial transport medium  Result Value Ref Range Status   SARS Coronavirus 2 by RT PCR NEGATIVE NEGATIVE Final    Comment: (NOTE) SARS-CoV-2 target nucleic acids are NOT DETECTED.  The SARS-CoV-2 RNA is generally detectable in upper respiratory specimens during the acute phase of infection. The lowest concentration of SARS-CoV-2 viral copies this assay can detect is 138 copies/mL. A negative result does not preclude SARS-Cov-2 infection and should not be used as the sole basis for treatment or other patient management decisions. A negative result may occur with  improper specimen collection/handling, submission of specimen other than nasopharyngeal swab, presence of viral mutation(s) within the areas targeted by this assay, and inadequate number of viral copies(<138 copies/mL). A negative result must be combined with clinical observations, patient history, and epidemiological information. The expected result is Negative.  Fact Sheet for Patients:  EntrepreneurPulse.com.au  Fact Sheet for Healthcare Providers:  IncredibleEmployment.be  This test is no t yet approved or cleared by the [place] FDA and  has been authorized for detection and/or diagnosis of SARS-CoV-2 by FDA  under an Emergency Use Authorization (EUA). This EUA will remain  in effect (meaning this test can be used) for the duration of the COVID-19 declaration under Section 564(b)(1) of the Act, 21 U.S.C.section 360bbb-3(b)(1), unless the authorization is terminated  or revoked sooner.       Influenza A by PCR NEGATIVE NEGATIVE Final   Influenza B by PCR NEGATIVE NEGATIVE  Final    Comment: (NOTE) The Xpert Xpress SARS-CoV-2/FLU/RSV plus assay is intended as an aid in the diagnosis of influenza from Nasopharyngeal swab specimens and should not be used as a sole basis for treatment. Nasal washings and aspirates are unacceptable for Xpert Xpress SARS-CoV-2/FLU/RSV testing.  Fact Sheet for Patients: EntrepreneurPulse.com.au  Fact Sheet for Healthcare Providers: IncredibleEmployment.be  This test is not yet approved or cleared by the [place] FDA and has been authorized for detection and/or diagnosis of SARS-CoV-2 by FDA under an Emergency Use Authorization (EUA). This EUA will remain in effect (meaning this test can be used) for the duration of the COVID-19 declaration under Section 564(b)(1) of the Act, 21 U.S.C. section 360bbb-3(b)(1), unless the authorization is terminated or revoked.  Performed at [facility], [place] [address], [place], [place] [address]       Radiology Studies: No results found.   LOS: 3 days   Antonieta Pert, MD [facility]  10/11/2020, 11:33 AM

## 2020-10-12 LAB — CBC
HCT: 30.6 % — ABNORMAL LOW (ref 39.0–52.0)
Hemoglobin: 9.9 g/dL — ABNORMAL LOW (ref 13.0–17.0)
MCH: 33.4 pg (ref 26.0–34.0)
MCHC: 32.4 g/dL (ref 30.0–36.0)
MCV: 103.4 fL — ABNORMAL HIGH (ref 80.0–100.0)
Platelets: 140 10*3/uL — ABNORMAL LOW (ref 150–400)
RBC: 2.96 MIL/uL — ABNORMAL LOW (ref 4.22–5.81)
RDW: 12.7 % (ref 11.5–15.5)
WBC: 9 10*3/uL (ref 4.0–10.5)
nRBC: 0 % (ref 0.0–0.2)

## 2020-10-12 LAB — BASIC METABOLIC PANEL
Anion gap: 7 (ref 5–15)
BUN: 20 mg/dL (ref 8–23)
CO2: 23 mmol/L (ref 22–32)
Calcium: 8.3 mg/dL — ABNORMAL LOW (ref 8.9–10.3)
Chloride: 113 mmol/L — ABNORMAL HIGH (ref 98–111)
Creatinine, Ser: 1.79 mg/dL — ABNORMAL HIGH (ref 0.61–1.24)
GFR, Estimated: 39 mL/min — ABNORMAL LOW (ref >60)
Glucose, Bld: 78 mg/dL (ref 70–99)
Potassium: 3.8 mmol/L (ref 3.5–5.1)
Sodium: 143 mmol/L (ref 135–145)

## 2020-10-12 MED ORDER — ADULT MULTIVITAMIN W/MINERALS CH
1.0000 | ORAL_TABLET | Freq: Every day | ORAL | Status: DC
  Administered 2020-10-12 – 2020-10-15 (×4): 1 via ORAL
  Filled 2020-10-12 (×3): qty 1

## 2020-10-12 NOTE — Progress Notes (Signed)
Physical Therapy Treatment Patient Details Name: Brent [name] MRN: [id number] DOB: 1945-12-31 Today's Date: 10/12/2020    History of Present Illness 75 yo male presents to [facility] with weakness, debility, diarrhea. Pt + for hypokalemia, hypocalcemia; recently had covid. PMH includes: CKD IV, CHF, chron's disease, HTN, BPPV, celiac disease, ICD, and gout.    PT Comments    Pt seated in unlocked recliner on arrival.  Focused on sit to stand transfer and progression of gt training with use of RW.  Pt very flexed in posture and fatigues quickly with activity.      Follow Up Recommendations  SNF;Supervision/Assistance - 24 hour     Equipment Recommendations  Rolling walker with 5" wheels    Recommendations for Other Services       Precautions / Restrictions Precautions Precautions: Fall Restrictions Weight Bearing Restrictions: No    Mobility  Bed Mobility               General bed mobility comments: Pt seated in recliner on arrival.  Chair was not locked and no alarm pad in place.    Transfers Overall transfer level: Needs assistance Equipment used: Rolling walker (2 wheeled) Transfers: Sit to/from Stand Sit to Stand: Min guard;Min assist         General transfer comment: performed x 5 sit to stand with cues for safe hand placement and forward weight shifting.  He require boost into standing on first attempt then able to move into standing with min guard as forward weight shifting improved.  Ambulation/Gait Ambulation/Gait assistance: Mod assist Gait Distance (Feet): 40 Feet Assistive device: Rolling walker (2 wheeled) Gait Pattern/deviations: Step-through pattern;Decreased stride length;Shuffle;Trunk flexed;Drifts right/left Gait velocity: decr   General Gait Details: Cues for knee extension, hip extension, trunk extension and close proxmity to RW.  He had tendency to flex his posture and push device too far forward.   Stairs             Wheelchair  Mobility    Modified Rankin (Stroke Patients Only)       Balance Overall balance assessment: Needs assistance;History of Falls   Sitting balance-Leahy Scale: Fair       Standing balance-Leahy Scale: Poor Standing balance comment: reliant on external assist, up to mod PT assist to correct LOB                            Cognition Arousal/Alertness: Awake/alert Behavior During Therapy: WFL for tasks assessed/performed Overall Cognitive Status: Impaired/Different from baseline Area of Impairment: Problem solving;Following commands                       Following Commands: Follows one step commands inconsistently;Follows one step commands with increased time     Problem Solving: Slow processing;Difficulty sequencing;Requires verbal cues;Requires tactile cues        Exercises      General Comments        Pertinent Vitals/Pain Pain Assessment: No/denies pain    Home Living                      Prior Function            PT Goals (current goals can now be found in the care plan section) Acute Rehab PT Goals Patient Stated Goal: get stronger Potential to Achieve Goals: Good Progress towards PT goals: Progressing toward goals    Frequency    Min  2X/week      PT Plan Current plan remains appropriate    Co-evaluation              AM-PAC PT "6 Clicks" Mobility   Outcome Measure  Help needed turning from your back to your side while in a flat bed without using bedrails?: A Little Help needed moving from lying on your back to sitting on the side of a flat bed without using bedrails?: A Little Help needed moving to and from a bed to a chair (including a wheelchair)?: A Little Help needed standing up from a chair using your arms (e.g., wheelchair or bedside chair)?: A Little Help needed to walk in hospital room?: A Lot Help needed climbing 3-5 steps with a railing? : Total 6 Click Score: 15    End of Session Equipment  Utilized During Treatment: Gait belt Activity Tolerance: Patient limited by fatigue Patient left: in chair;with call bell/phone within reach;with chair alarm set (locked chair as it was unlocked on arrival.) Nurse Communication: Mobility status PT Visit Diagnosis: Unsteadiness on feet (R26.81);History of falling (Z91.81);Muscle weakness (generalized) (M62.81)     Time: 8841-6606 PT Time Calculation (min) (ACUTE ONLY): 19 min  Charges:  $Gait Training: 8-22 mins                     Erasmo Leventhal , PTA Acute Rehabilitation Services Pager [phone] Office [phone]    Ronia Hazelett Eli Hose 10/12/2020, 4:30 PM

## 2020-10-12 NOTE — Progress Notes (Signed)
Nutrition Follow-up  DOCUMENTATION CODES:   Not applicable  INTERVENTION:   Ensure Enlive po BID, each supplement provides 350 kcal and 20 grams of protein -Encouraged pt to follow-up with [place] [facility] about receiving Ensure/Boost shakes  Discontinue Magic Cup, pt does not like.   Add MVI with Minerals  Pt would likely benefit from community meal program such as Meals on Wheels   NUTRITION DIAGNOSIS:   Inadequate oral intake related to acute illness (recent COVID-19 infection) as evidenced by per patient/family report.  Being adressed via supplements  GOAL:   Patient will meet greater than or equal to 90% of their needs  Progressing  MONITOR:   PO intake, Supplement acceptance, Labs, Weight trends, I & O's  REASON FOR ASSESSMENT:   Malnutrition Screening Tool    ASSESSMENT:   75 yo male with a PMH of HFrEF, HTN, BPH, Crohn's disease, CKD 3 B, and ICD who presents from home via the [place] [facility] for hypokalemia. Tested positive for COVID-19 10 days ago.  Pt with very good appetite; pt ate 100% of mac and cheese, tilapia and broccoli at lunch today. Now eating fruit and chocolate ice cream. Pt reports he also likes the Ensure shakes. Pt does not like the Magic Cup, will send regular ice cream instead as pt enjoys this  Pt reports poor appetite with diarrhea prior to admission which has improved  Pt reports he lives home alone and typically only eats 1 meal per day, a sandwich. Encourage pt to utilize Ensure or Boost type nutrition shake at home which he is agreeable to and says he gets for free from [place] [facility]  Current wt 75.8 kg; 167 pounds. Pt reports an aid comes weekly to help with this meds and weighs him. UBW has been around 165-170 pounds  Labs:potassium wdl, phosphorus wdl, magnesium wdl Meds: remeron, Vit B-12, magox, ferrous sulfate   Diet Order:   Diet Order             Diet Heart Room service appropriate? Yes with Assist; Fluid consistency: Thin  Diet  effective now                   EDUCATION NEEDS:   Education needs have been addressed  Skin:  Skin Assessment: Reviewed RN Assessment  Last BM:  10/09/20  Height:   Ht Readings from Last 1 Encounters:  10/09/20 _0  (1.93 m)    Weight:   Wt Readings from Last 1 Encounters:  10/12/20 75.8 kg     BMI:  Body mass index is 20.34 kg/m.  Estimated Nutritional Needs:   Kcal:  2100-2300  Protein:  85-100 grams  Fluid:  >2.1 L   Kerman Passey MS, RDN, LDN, CNSC Registered Dietitian III Clinical Nutrition RD Pager and On-Call Pager Number Located in [place]

## 2020-10-12 NOTE — TOC Progression Note (Addendum)
Transition of Care [facility]) - Progression Note    Patient Details  Name: Brent [name] MRN: [id number] Date of Birth: 1945-07-26  Transition of Care [facility]) CM/SW [place], [place] Phone Number: 10/12/2020, 11:23 AM  Clinical Narrative:    CSW noted that PASSR has been completed ([id number]). At this time insurance is still pending. TOC will continue to follow for discharge needs.   4:30 pm Authorization has been received. [facility] unable to accept over the weekend, will have a bed available Monday. Everlene Balls ID [id number] 10/12/2020-10/16/2020 CSW updated pt and he is agreeable. Expected Discharge Plan: [place] Barriers to Discharge: Continued Medical Work up, Ship broker  Expected Discharge Plan and Services Expected Discharge Plan: [place]       Living arrangements for the past 2 months: [place]                                       Social Determinants of Health (SDOH) Interventions    Readmission Risk Interventions No flowsheet data found.

## 2020-10-12 NOTE — Progress Notes (Signed)
PROGRESS NOTE    Brent [name]  KGM:[id number] DOB: 06-15-1945 DOA: 10/08/2020 PCP: Wardell Honour, MD   Chief Complaint  Patient presents with   Abnormal Lab   Brief Narrative: 86 old male with history of diastolic CHF, hypertension, CKD stage IIIb, Crohn's disease, on chronic anticoagulant Eliquis, ICD in place follows up with [facility] was seen at the clinic found to have low potassium and generalized weakness and referred to the ED for admission.  He recently had COVID 10 days prior to admission, and also developed diarrhea 4 to 5 days prior to admission with multiple watery nonbloody diarrhea. Patient was admitted for hypokalemia and hypocalcemia.Electrolytes were aggressively repleted and now stabilized and normal. Likely from his recent diarrhea due to his Crohn's disease,poor oral intake in light of recent COVID +.  Encourage oral hydration. At this time is medically stable for discharge to skilled nursing facility pending insurance  Subjective: Resting comfortably.  Mild cough.No new complaints no acute events overnight. Creatinine forethought on 1.7  Assessment & Plan:  Multiple electrolytes imbalance with hypokalemia hypomagnesemia and hypophosphatemia: Electrolytes were aggressively repleted and now stabilized and normal. Likely from his recent diarrhea due to his Crohn's disease,poor oral intake in light of recent COVID +.  Encourage oral hydration.  Anemia of chronic kidney disease: Hemoglobin overall stable.  continue iron supplementation Recent Labs  Lab 10/08/20 1855 10/10/20 0154 10/11/20 0342 10/12/20 0650  HGB 11.6* 10.0* 9.9* 9.9*  HCT 37.1* 31.7* 30.8* 30.6*     Chronic systolic CHF with EF 64-40% in 2021  ICD in place  On chronic eliquis Essential hypertension Blood pressure stable, volume status euvolemic with slightly uptrending, monitor closely. Continue Imdur, metoprolol, Imdur ,Fraxiga, ivarbradine as well as Eliquis.Monitor intake output Daily  weight. Net IO Since Admission: -577.18 mL [10/12/20 0828]  Filed Weights   10/10/20 0500 10/11/20 0500 10/12/20 0316  Weight: 73.4 kg 73.7 kg 75.8 kg    Generalized weakness due to multiple electrolyte imbalance.  SNF pending  CKD stage IIIb: Renal function appears stable at baseline at 1.8 - 1.9.  today at 1.7 Recent Labs  Lab 10/08/20 1855 10/09/20 0435 10/10/20 0154 10/11/20 0342 10/12/20 0650  BUN 29* 27* 22 19 20   CREATININE 2.24* 2.06* 1.81* 1.92* 1.79*     COVID-19 infection recently -was positive August 4 and off isolation.  He had a Booster.  History of Crohn's disease continue Entocort, Remicade.  Has had recent nonbloody diarrhea.  Reports he gets diarrhea on and off.  Bipolar disorder on Depakote Seroquel and Remeron  Gout continue allopurinol  Nutritional status  inadequate oral intake, new BMI at 20.3  Diet Order             Diet Heart Room service appropriate? Yes with Assist; Fluid consistency: Thin  Diet effective now                   Nutrition Problem: Inadequate oral intake Etiology: acute illness (recent COVID-19 infection) Signs/Symptoms: per patient/family report Interventions: Ensure Enlive (each supplement provides 350kcal and 20 grams of protein), Magic cup, MVI Patient's Body mass index is 20.34 kg/m. DVT prophylaxis: Continue Eliquis Code Status:   Code Status: Full Code  Family Communication: plan of care discussed with patient at bedside. Status is: Inpatient  Remains inpatient appropriate because:Unsafe d/c plan and Inpatient level of care appropriate due to severity of illness  Dispo: The patient is from: Home lives alone  Anticipated d/c is to: SNF pending authorization and PSAAR.              Patient currently is medically stable   Difficult to place patient No  Unresulted Labs (From admission, onward)    None       Medications reviewed:  Scheduled Meds:  allopurinol  200 mg Oral Daily   apixaban  5 mg  Oral BID   budesonide  9 mg Oral Q breakfast   dapagliflozin propanediol  10 mg Oral QAC breakfast   darifenacin  7.5 mg Oral Daily   divalproex  1,500 mg Oral QHS   feeding supplement  237 mL Oral BID BM   ferrous sulfate  325 mg Oral BID WC   isosorbide mononitrate  60 mg Oral Daily   ivabradine  5 mg Oral BID WC   magnesium oxide  400 mg Oral Daily   metoprolol succinate  25 mg Oral BID   mirtazapine  7.5 mg Oral QHS   pantoprazole  40 mg Oral Daily   QUEtiapine  150 mg Oral QHS   tamsulosin  0.4 mg Oral Daily   vitamin B-12  1,000 mcg Oral Daily   Continuous Infusions:  lactated ringers Stopped (10/09/20 1452)   Consultants:see note  Procedures:see note Antimicrobials: Anti-infectives (From admission, onward)    None      Culture/Microbiology    Component Value Date/Time   SDES BLOOD LEFT HAND 05/05/2018 0815   SPECREQUEST  05/05/2018 0815    BOTTLES DRAWN AEROBIC AND ANAEROBIC Blood Culture adequate volume   CULT  05/05/2018 0815    NO GROWTH 5 DAYS Performed at [facility], [place]. [address] [place], [place] [address]    REPTSTATUS 05/10/2018 FINAL 05/05/2018 0815    Other culture-see note  Objective: Vitals: Today's Vitals   10/11/20 2302 10/12/20 0315 10/12/20 0316 10/12/20 0826  BP: (!) 147/92 (!) 152/79  (!) 142/78  Pulse: 69 69  77  Resp: 17 17  18   Temp: 98.1 F (36.7 C) 98.1 F (36.7 C)  98.3 F (36.8 C)  TempSrc: Oral Oral  Oral  SpO2: 98% 94%  97%  Weight:   75.8 kg   Height:      PainSc:        Intake/Output Summary (Last 24 hours) at 10/12/2020 0828 Last data filed at 10/12/2020 0650 Gross per 24 hour  Intake 240 ml  Output 600 ml  Net -360 ml    Filed Weights   10/10/20 0500 10/11/20 0500 10/12/20 0316  Weight: 73.4 kg 73.7 kg 75.8 kg   Weight change: 2.1 kg  Intake/Output from previous day: 08/18 0701 - 08/19 0700 In: 240 [P.O.:240] Out: 600 [Urine:600] Intake/Output this shift: No intake/output data recorded. Filed  Weights   10/10/20 0500 10/11/20 0500 10/12/20 0316  Weight: 73.4 kg 73.7 kg 75.8 kg   Examination: General exam: AAOx 3, pleasant, THIN HEENT:Oral mucosa moist, Ear/Nose WNL grossly, dentition normal. Respiratory system: bilaterally diminished,  no use of accessory muscle Cardiovascular system: S1 & S2 +, No JVD,. Gastrointestinal system: Abdomen soft,NT,ND, BS+ Nervous System:Alert, awake, moving extremities and grossly nonfocal Extremities: NO edema, distal peripheral pulses palpable.  Skin: No rashes,no icterus. MSK: Normal muscle bulk,tone, power   Data Reviewed: I have personally reviewed following labs and imaging studies CBC: Recent Labs  Lab 10/08/20 1855 10/10/20 0154 10/11/20 0342 10/12/20 0650  WBC 12.0* 7.7 8.4 9.0  NEUTROABS 8.5* 5.0  --   --   HGB  11.6* 10.0* 9.9* 9.9*  HCT 37.1* 31.7* 30.8* 30.6*  MCV 106.3* 104.3* 105.5* 103.4*  PLT 222 155 147* 140*    Basic Metabolic Panel: Recent Labs  Lab 10/08/20 1855 10/09/20 0435 10/10/20 0154 10/11/20 0342 10/12/20 0650  NA 142 141 142 144 143  K 2.7* 3.3* 3.9 3.9 3.8  CL 108 111 114* 109 113*  CO2 25 23 22 25 23   GLUCOSE 114* 94 119* 101* 78  BUN 29* 27* 22 19 20   CREATININE 2.24* 2.06* 1.81* 1.92* 1.79*  CALCIUM 8.5* 8.5* 8.5* 8.9 8.3*  MG 1.3* 1.7 1.4* 2.0  --   PHOS  --   --  1.6* 3.8  --     GFR: Estimated Creatinine Clearance: 38.2 mL/min (A) (by C-G formula based on SCr of 1.79 mg/dL (H)). Liver Function Tests: Recent Labs  Lab 10/08/20 1855 10/09/20 0439 10/10/20 0154  AST 13*  --  11*  ALT 10  --  9  ALKPHOS 67  --  49  BILITOT 0.5  --  0.7  PROT 6.9  --  5.8*  ALBUMIN 2.7* 2.5* 2.2*    No results for input(s): LIPASE, AMYLASE in the last 168 hours. No results for input(s): AMMONIA in the last 168 hours. Coagulation Profile: No results for input(s): INR, PROTIME in the last 168 hours. Cardiac Enzymes: No results for input(s): CKTOTAL, CKMB, CKMBINDEX, TROPONINI in the last 168  hours. BNP (last 3 results) No results for input(s): PROBNP in the last 8760 hours. HbA1C: No results for input(s): HGBA1C in the last 72 hours. CBG: No results for input(s): GLUCAP in the last 168 hours. Lipid Profile: No results for input(s): CHOL, HDL, LDLCALC, TRIG, CHOLHDL, LDLDIRECT in the last 72 hours. Thyroid Function Tests: No results for input(s): TSH, T4TOTAL, FREET4, T3FREE, THYROIDAB in the last 72 hours. Anemia Panel: No results for input(s): VITAMINB12, FOLATE, FERRITIN, TIBC, IRON, RETICCTPCT in the last 72 hours. Sepsis Labs: No results for input(s): PROCALCITON, LATICACIDVEN in the last 168 hours.  Recent Results (from the past 240 hour(s))  Resp Panel by RT-PCR (Flu A&B, Covid) Nasopharyngeal Swab     Status: None   Collection Time: 10/08/20 10:44 PM   Specimen: Nasopharyngeal Swab; Nasopharyngeal(NP) swabs in vial transport medium  Result Value Ref Range Status   SARS Coronavirus 2 by RT PCR NEGATIVE NEGATIVE Final    Comment: (NOTE) SARS-CoV-2 target nucleic acids are NOT DETECTED.  The SARS-CoV-2 RNA is generally detectable in upper respiratory specimens during the acute phase of infection. The lowest concentration of SARS-CoV-2 viral copies this assay can detect is 138 copies/mL. A negative result does not preclude SARS-Cov-2 infection and should not be used as the sole basis for treatment or other patient management decisions. A negative result may occur with  improper specimen collection/handling, submission of specimen other than nasopharyngeal swab, presence of viral mutation(s) within the areas targeted by this assay, and inadequate number of viral copies(<138 copies/mL). A negative result must be combined with clinical observations, patient history, and epidemiological information. The expected result is Negative.  Fact Sheet for Patients:  EntrepreneurPulse.com.au  Fact Sheet for Healthcare Providers:   IncredibleEmployment.be  This test is no t yet approved or cleared by the [place] FDA and  has been authorized for detection and/or diagnosis of SARS-CoV-2 by FDA under an Emergency Use Authorization (EUA). This EUA will remain  in effect (meaning this test can be used) for the duration of the COVID-19 declaration under Section 564(b)(1) of  the Act, 21 U.S.C.section 360bbb-3(b)(1), unless the authorization is terminated  or revoked sooner.       Influenza A by PCR NEGATIVE NEGATIVE Final   Influenza B by PCR NEGATIVE NEGATIVE Final    Comment: (NOTE) The Xpert Xpress SARS-CoV-2/FLU/RSV plus assay is intended as an aid in the diagnosis of influenza from Nasopharyngeal swab specimens and should not be used as a sole basis for treatment. Nasal washings and aspirates are unacceptable for Xpert Xpress SARS-CoV-2/FLU/RSV testing.  Fact Sheet for Patients: EntrepreneurPulse.com.au  Fact Sheet for Healthcare Providers: IncredibleEmployment.be  This test is not yet approved or cleared by the [place] FDA and has been authorized for detection and/or diagnosis of SARS-CoV-2 by FDA under an Emergency Use Authorization (EUA). This EUA will remain in effect (meaning this test can be used) for the duration of the COVID-19 declaration under Section 564(b)(1) of the Act, 21 U.S.C. section 360bbb-3(b)(1), unless the authorization is terminated or revoked.  Performed at [facility], [place] [address], [place], [place] [address]       Radiology Studies: No results found.   LOS: 4 days   Antonieta Pert, MD [facility]  10/12/2020, 8:28 AM

## 2020-10-13 NOTE — Progress Notes (Addendum)
PROGRESS NOTE  Brent [name] ENI:[id number] DOB: 1945-05-07 DOA: 10/08/2020 PCP: Wardell Honour, MD  HPI/Recap of past 60 hours: 75 year old male with history of diastolic congestive heart failure, hypertension, chronic kidney disease, stage IIIb, Crohn's disease on chronic anticoagulation with Eliquis ICD in place followed up at the [facility] clinic he was sent to the ER from the clinic because he was found to have low potassium and generalized weakness he recently had COVID 10 days ago  October 13, 2020: Patient seen and examined at bedside he denies any new complaints he said he is feeling stronger every day he was actually up from the chair from the bed and stayed almost all day yesterday he said on the chair  Assessment/Plan: Principal Problem:   Hypokalemia Active Problems:   Anemia in chronic kidney disease   Essential hypertension, benign   Chronic systolic CHF (congestive heart failure) (HCC)   Weakness   ICD (implantable cardioverter-defibrillator) in place   Hypomagnesemia   CKD (chronic kidney disease), stage III ([place])   COVID-19 virus infection  #1 multiple electrolyte imbalance Hypokalemia Hypomagnesemia Hypophosphatemia This is likely from having diarrhea due to his Crohn's disease and poor oral intake Electrolytes are aggressively repleted  2.  Anemia of chronic disease hemoglobin is stable Continue iron supplements  3.  Chronic systolic congestive heart failure with EF of 25 to 30% in 2021 ICD in place On chronic Eliquis Hypertension.  Continue Imdur metoprolol if blood in as well as Eliquis Monitor daily weights for his CHF  4.  Chronic kidney disease disease stage IIIb renal function is stable and he is at baseline creatinine  5.  COVID-19 infection.  He was + September 27, 2020 and is currently off isolation  6.  History of Crohn's disease. Caused diarrhea and electrolyte imbalance  Improved  Continue Entocort and Remicade   7.Bipolar disorder  Continue  Depakote Seroquel and Remeron  Code Status: Full  Severity of Illness: The appropriate patient status for this patient is INPATIENT. Inpatient status is judged to be reasonable and necessary in order to provide the required intensity of service to ensure the patient's safety. The patient's presenting symptoms, physical exam findings, and initial radiographic and laboratory data in the context of their chronic comorbidities is felt to place them at high risk for further clinical deterioration. Furthermore, it is not anticipated that the patient will be medically stable for discharge from the hospital within 2 midnights of admission. The following factors support the patient status of inpatient.   " Waiting for SNF  * I certify that at the point of admission it is my clinical judgment that the patient will require inpatient hospital care spanning beyond 2 midnights from the point of admission due to high intensity of service, high risk for further deterioration and high frequency of surveillance required.*   Family Communication: Patient  Disposition Plan: Waiting for SNF Status is: Inpatient   Dispo: The patient is from: Home              Anticipated d/c is to: for discharge to skilled nursing facility pending insurance              Anticipated d/c date is:               Patient currently not medically stable for discharge  Consultants: None  Procedures: None  Antimicrobials: None  DVT prophylaxis: Apixaban   Objective: Vitals:   10/12/20 2224 10/13/20 0030 10/13/20 0500 10/13/20 0755  BP: Marland Kitchen)  146/88 (!) 146/84  (!) 172/93  Pulse: 76 78  74  Resp: 18 18  18   Temp: 98.9 F (37.2 C) 98 F (36.7 C)  97.8 F (36.6 C)  TempSrc: Oral Oral  Oral  SpO2: 97% 94%  96%  Weight:   72.3 kg   Height:        Intake/Output Summary (Last 24 hours) at 10/13/2020 0945 Last data filed at 10/12/2020 1945 Gross per 24 hour  Intake 240 ml  Output --  Net 240 ml   Filed Weights    10/11/20 0500 10/12/20 0316 10/13/20 0500  Weight: 73.7 kg 75.8 kg 72.3 kg   Body mass index is 19.4 kg/m.  Exam:  General: 75 y.o. year-old male well developed well nourished in no acute distress.  Alert and oriented x3 Cardiovascular: Regular rate and rhythm with no rubs or gallops.  No thyromegaly or JVD noted.   Respiratory: Clear to auscultation with no wheezes or rales. Good inspiratory effort. Abdomen: Soft nontender nondistended with normal bowel sounds x4 quadrants. Musculoskeletal: No lower extremity edema. 2/4 pulses in all 4 extremities. Skin: No ulcerative lesions noted or rashes, Psychiatry: Mood is appropriate for condition and setting Neurology:    Data Reviewed: CBC: Recent Labs  Lab 10/08/20 1855 10/10/20 0154 10/11/20 0342 10/12/20 0650  WBC 12.0* 7.7 8.4 9.0  NEUTROABS 8.5* 5.0  --   --   HGB 11.6* 10.0* 9.9* 9.9*  HCT 37.1* 31.7* 30.8* 30.6*  MCV 106.3* 104.3* 105.5* 103.4*  PLT 222 155 147* 212*   Basic Metabolic Panel: Recent Labs  Lab 10/08/20 1855 10/09/20 0435 10/10/20 0154 10/11/20 0342 10/12/20 0650  NA 142 141 142 144 143  K 2.7* 3.3* 3.9 3.9 3.8  CL 108 111 114* 109 113*  CO2 25 23 22 25 23   GLUCOSE 114* 94 119* 101* 78  BUN 29* 27* 22 19 20   CREATININE 2.24* 2.06* 1.81* 1.92* 1.79*  CALCIUM 8.5* 8.5* 8.5* 8.9 8.3*  MG 1.3* 1.7 1.4* 2.0  --   PHOS  --   --  1.6* 3.8  --    GFR: Estimated Creatinine Clearance: 36.5 mL/min (A) (by C-G formula based on SCr of 1.79 mg/dL (H)). Liver Function Tests: Recent Labs  Lab 10/08/20 1855 10/09/20 0439 10/10/20 0154  AST 13*  --  11*  ALT 10  --  9  ALKPHOS 67  --  49  BILITOT 0.5  --  0.7  PROT 6.9  --  5.8*  ALBUMIN 2.7* 2.5* 2.2*   No results for input(s): LIPASE, AMYLASE in the last 168 hours. No results for input(s): AMMONIA in the last 168 hours. Coagulation Profile: No results for input(s): INR, PROTIME in the last 168 hours. Cardiac Enzymes: No results for input(s):  CKTOTAL, CKMB, CKMBINDEX, TROPONINI in the last 168 hours. BNP (last 3 results) No results for input(s): PROBNP in the last 8760 hours. HbA1C: No results for input(s): HGBA1C in the last 72 hours. CBG: No results for input(s): GLUCAP in the last 168 hours. Lipid Profile: No results for input(s): CHOL, HDL, LDLCALC, TRIG, CHOLHDL, LDLDIRECT in the last 72 hours. Thyroid Function Tests: No results for input(s): TSH, T4TOTAL, FREET4, T3FREE, THYROIDAB in the last 72 hours. Anemia Panel: No results for input(s): VITAMINB12, FOLATE, FERRITIN, TIBC, IRON, RETICCTPCT in the last 72 hours. Urine analysis:    Component Value Date/Time   COLORURINE YELLOW 10/09/2020 [place] 10/09/2020 0554   LABSPEC 1.011 10/09/2020 0554  PHURINE 6.0 10/09/2020 [place] 10/09/2020 0554   HGBUR NEGATIVE 10/09/2020 0554   BILIRUBINUR NEGATIVE 10/09/2020 0554   BILIRUBINUR Positive 04/26/2020 1504   KETONESUR NEGATIVE 10/09/2020 0554   PROTEINUR NEGATIVE 10/09/2020 0554   UROBILINOGEN negative (A) 04/26/2020 1504   UROBILINOGEN 0.2 07/10/2010 1709   NITRITE NEGATIVE 10/09/2020 0554   LEUKOCYTESUR NEGATIVE 10/09/2020 0554   Sepsis Labs: @LABRCNTIP (procalcitonin:4,lacticidven:4)  ) Recent Results (from the past 240 hour(s))  Resp Panel by RT-PCR (Flu A&B, Covid) Nasopharyngeal Swab     Status: None   Collection Time: 10/08/20 10:44 PM   Specimen: Nasopharyngeal Swab; Nasopharyngeal(NP) swabs in vial transport medium  Result Value Ref Range Status   SARS Coronavirus 2 by RT PCR NEGATIVE NEGATIVE Final    Comment: (NOTE) SARS-CoV-2 target nucleic acids are NOT DETECTED.  The SARS-CoV-2 RNA is generally detectable in upper respiratory specimens during the acute phase of infection. The lowest concentration of SARS-CoV-2 viral copies this assay can detect is 138 copies/mL. A negative result does not preclude SARS-Cov-2 infection and should not be used as the sole basis  for treatment or other patient management decisions. A negative result may occur with  improper specimen collection/handling, submission of specimen other than nasopharyngeal swab, presence of viral mutation(s) within the areas targeted by this assay, and inadequate number of viral copies(<138 copies/mL). A negative result must be combined with clinical observations, patient history, and epidemiological information. The expected result is Negative.  Fact Sheet for Patients:  EntrepreneurPulse.com.au  Fact Sheet for Healthcare Providers:  IncredibleEmployment.be  This test is no t yet approved or cleared by the [place] FDA and  has been authorized for detection and/or diagnosis of SARS-CoV-2 by FDA under an Emergency Use Authorization (EUA). This EUA will remain  in effect (meaning this test can be used) for the duration of the COVID-19 declaration under Section 564(b)(1) of the Act, 21 U.S.C.section 360bbb-3(b)(1), unless the authorization is terminated  or revoked sooner.       Influenza A by PCR NEGATIVE NEGATIVE Final   Influenza B by PCR NEGATIVE NEGATIVE Final    Comment: (NOTE) The Xpert Xpress SARS-CoV-2/FLU/RSV plus assay is intended as an aid in the diagnosis of influenza from Nasopharyngeal swab specimens and should not be used as a sole basis for treatment. Nasal washings and aspirates are unacceptable for Xpert Xpress SARS-CoV-2/FLU/RSV testing.  Fact Sheet for Patients: EntrepreneurPulse.com.au  Fact Sheet for Healthcare Providers: IncredibleEmployment.be  This test is not yet approved or cleared by the [place] FDA and has been authorized for detection and/or diagnosis of SARS-CoV-2 by FDA under an Emergency Use Authorization (EUA). This EUA will remain in effect (meaning this test can be used) for the duration of the COVID-19 declaration under Section 564(b)(1) of the Act, 21  U.S.C. section 360bbb-3(b)(1), unless the authorization is terminated or revoked.  Performed at [facility], [place] [address], [place], [place] [address]       Studies: No results found.  Scheduled Meds:  allopurinol  200 mg Oral Daily   apixaban  5 mg Oral BID   budesonide  9 mg Oral Q breakfast   dapagliflozin propanediol  10 mg Oral QAC breakfast   darifenacin  7.5 mg Oral Daily   divalproex  1,500 mg Oral QHS   feeding supplement  237 mL Oral BID BM   ferrous sulfate  325 mg Oral BID WC   isosorbide mononitrate  60 mg Oral Daily   ivabradine  5 mg Oral BID WC   magnesium oxide  400 mg Oral Daily   metoprolol succinate  25 mg Oral BID   mirtazapine  7.5 mg Oral QHS   multivitamin with minerals  1 tablet Oral Daily   pantoprazole  40 mg Oral Daily   QUEtiapine  150 mg Oral QHS   tamsulosin  0.4 mg Oral Daily   vitamin B-12  1,000 mcg Oral Daily    Continuous Infusions:  lactated ringers Stopped (10/09/20 1452)     LOS: 5 days     Cristal Deer, MD [facility]  To reach me or the doctor on call, go to: [email or web address] Password [email or web address]  10/13/2020, 9:45 AM

## 2020-10-14 NOTE — Progress Notes (Signed)
PROGRESS NOTE  Brent [name]:[id number] DOB: 1945/03/10 DOA: 10/08/2020 PCP: Wardell Honour, MD  HPI/Recap of past 42 hours: 75 year old male with history of diastolic congestive heart failure, hypertension, chronic kidney disease, stage IIIb, Crohn's disease on chronic anticoagulation with Eliquis ICD in place followed up at the [facility] clinic he was sent to the ER from the clinic because he was found to have low potassium and generalized weakness he recently had COVID 10 days ago  October 13, 2020: Patient seen and examined at bedside he denies any new complaints he said he is feeling stronger every day he was actually up from the chair from the bed and stayed almost all day  yesterday he said on the chair  October 14, 2020: Patient seen and examined at bedside.  He stated his been ambulating around room.  Denies any new complaints  Assessment/Plan: Principal Problem:   Hypokalemia Active Problems:   Anemia in chronic kidney disease   Essential hypertension, benign   Chronic systolic CHF (congestive heart failure) (HCC)   Weakness   ICD (implantable cardioverter-defibrillator) in place   Hypomagnesemia   CKD (chronic kidney disease), stage III ([place])   COVID-19 virus infection  #1 multiple electrolyte imbalance Hypokalemia Hypomagnesemia Hypophosphatemia This is likely from having diarrhea due to his Crohn's disease and poor oral intake Electrolytes are aggressively repleted  2.  Anemia of chronic disease hemoglobin is stable Continue iron supplements  3.  Chronic systolic congestive heart failure with EF of 25 to 30% in 2021 ICD in place On chronic Eliquis Hypertension.  Continue Imdur metoprolol if blood in as well as Eliquis Monitor daily weights for his CHF  4.  Chronic kidney disease disease stage IIIb renal function is stable and he is at baseline creatinine  5.  COVID-19 infection.  He was + September 27, 2020 and is currently off isolation  6.  History of Crohn's  disease. Caused diarrhea and electrolyte imbalance  Improved  Continue Entocort and Remicade   7.Bipolar disorder  Continue Depakote Seroquel and Remeron  Code Status: Full  Severity of Illness: The appropriate patient status for this patient is INPATIENT. Inpatient status is judged to be reasonable and necessary in order to provide the required intensity of service to ensure the patient's safety. The patient's presenting symptoms, physical exam findings, and initial radiographic and laboratory data in the context of their chronic comorbidities is felt to place them at high risk for further clinical deterioration. Furthermore, it is not anticipated that the patient will be medically stable for discharge from the hospital within 2 midnights of admission. The following factors support the patient status of inpatient.   " Waiting for SNF  * I certify that at the point of admission it is my clinical judgment that the patient will require inpatient hospital care spanning beyond 2 midnights from the point of admission due to high intensity of service, high risk for further deterioration and high frequency of surveillance required.*   Family Communication: Patient  Disposition Plan: Waiting for SNF Status is: Inpatient   Dispo: The patient is from: Home              Anticipated d/c is to: for discharge to skilled nursing facility pending insurance              Anticipated d/c date is:               Patient currently  medically stable for discharge  Consultants: None  Procedures:  None  Antimicrobials: None  DVT prophylaxis: Apixaban   Objective: Vitals:   10/13/20 1648 10/13/20 1941 10/13/20 2351 10/14/20 0833  BP: (!) 142/86 (!) 142/84 132/80 140/87  Pulse: 82 83 72 76  Resp: 18 17 19 18   Temp: 98.5 F (36.9 C) 98.6 F (37 C) 97.9 F (36.6 C) 97.8 F (36.6 C)  TempSrc: Oral Oral Oral Oral  SpO2: 98% 98% 96% 96%  Weight:      Height:        Intake/Output Summary (Last  24 hours) at 10/14/2020 0940 Last data filed at 10/14/2020 0900 Gross per 24 hour  Intake 660 ml  Output 100 ml  Net 560 ml    Filed Weights   10/11/20 0500 10/12/20 0316 10/13/20 0500  Weight: 73.7 kg 75.8 kg 72.3 kg   Body mass index is 19.4 kg/m.  Exam:  General: 75 y.o. year-old male well developed well nourished in no acute distress.  Alert and oriented x3 Cardiovascular: Regular rate and rhythm with no rubs or gallops.  No thyromegaly or JVD noted.   Respiratory: Clear to auscultation with no wheezes or rales. Good inspiratory effort. Abdomen: Soft nontender nondistended with normal bowel sounds x4 quadrants. Musculoskeletal: No lower extremity edema. 2/4 pulses in all 4 extremities. Skin: No ulcerative lesions noted or rashes, Psychiatry: Mood is appropriate for condition and setting Neurology:    Data Reviewed: CBC: Recent Labs  Lab 10/08/20 1855 10/10/20 0154 10/11/20 0342 10/12/20 0650  WBC 12.0* 7.7 8.4 9.0  NEUTROABS 8.5* 5.0  --   --   HGB 11.6* 10.0* 9.9* 9.9*  HCT 37.1* 31.7* 30.8* 30.6*  MCV 106.3* 104.3* 105.5* 103.4*  PLT 222 155 147* 140*    Basic Metabolic Panel: Recent Labs  Lab 10/08/20 1855 10/09/20 0435 10/10/20 0154 10/11/20 0342 10/12/20 0650  NA 142 141 142 144 143  K 2.7* 3.3* 3.9 3.9 3.8  CL 108 111 114* 109 113*  CO2 25 23 22 25 23   GLUCOSE 114* 94 119* 101* 78  BUN 29* 27* 22 19 20   CREATININE 2.24* 2.06* 1.81* 1.92* 1.79*  CALCIUM 8.5* 8.5* 8.5* 8.9 8.3*  MG 1.3* 1.7 1.4* 2.0  --   PHOS  --   --  1.6* 3.8  --     GFR: Estimated Creatinine Clearance: 36.5 mL/min (A) (by C-G formula based on SCr of 1.79 mg/dL (H)). Liver Function Tests: Recent Labs  Lab 10/08/20 1855 10/09/20 0439 10/10/20 0154  AST 13*  --  11*  ALT 10  --  9  ALKPHOS 67  --  49  BILITOT 0.5  --  0.7  PROT 6.9  --  5.8*  ALBUMIN 2.7* 2.5* 2.2*    No results for input(s): LIPASE, AMYLASE in the last 168 hours. No results for input(s): AMMONIA  in the last 168 hours. Coagulation Profile: No results for input(s): INR, PROTIME in the last 168 hours. Cardiac Enzymes: No results for input(s): CKTOTAL, CKMB, CKMBINDEX, TROPONINI in the last 168 hours. BNP (last 3 results) No results for input(s): PROBNP in the last 8760 hours. HbA1C: No results for input(s): HGBA1C in the last 72 hours. CBG: No results for input(s): GLUCAP in the last 168 hours. Lipid Profile: No results for input(s): CHOL, HDL, LDLCALC, TRIG, CHOLHDL, LDLDIRECT in the last 72 hours. Thyroid Function Tests: No results for input(s): TSH, T4TOTAL, FREET4, T3FREE, THYROIDAB in the last 72 hours. Anemia Panel: No results for input(s): VITAMINB12, FOLATE, FERRITIN, TIBC, IRON, RETICCTPCT in  the last 72 hours. Urine analysis:    Component Value Date/Time   COLORURINE YELLOW 10/09/2020 0554   APPEARANCEUR CLEAR 10/09/2020 0554   LABSPEC 1.011 10/09/2020 0554   PHURINE 6.0 10/09/2020 0554   GLUCOSEU NEGATIVE 10/09/2020 0554   HGBUR NEGATIVE 10/09/2020 0554   BILIRUBINUR NEGATIVE 10/09/2020 0554   BILIRUBINUR Positive 04/26/2020 1504   KETONESUR NEGATIVE 10/09/2020 0554   PROTEINUR NEGATIVE 10/09/2020 0554   UROBILINOGEN negative (A) 04/26/2020 1504   UROBILINOGEN 0.2 07/10/2010 1709   NITRITE NEGATIVE 10/09/2020 0554   LEUKOCYTESUR NEGATIVE 10/09/2020 0554   Sepsis Labs: @LABRCNTIP (procalcitonin:4,lacticidven:4)  ) Recent Results (from the past 240 hour(s))  Resp Panel by RT-PCR (Flu A&B, Covid) Nasopharyngeal Swab     Status: None   Collection Time: 10/08/20 10:44 PM   Specimen: Nasopharyngeal Swab; Nasopharyngeal(NP) swabs in vial transport medium  Result Value Ref Range Status   SARS Coronavirus 2 by RT PCR NEGATIVE NEGATIVE Final    Comment: (NOTE) SARS-CoV-2 target nucleic acids are NOT DETECTED.  The SARS-CoV-2 RNA is generally detectable in upper respiratory specimens during the acute phase of infection. The lowest concentration of SARS-CoV-2  viral copies this assay can detect is 138 copies/mL. A negative result does not preclude SARS-Cov-2 infection and should not be used as the sole basis for treatment or other patient management decisions. A negative result may occur with  improper specimen collection/handling, submission of specimen other than nasopharyngeal swab, presence of viral mutation(s) within the areas targeted by this assay, and inadequate number of viral copies(<138 copies/mL). A negative result must be combined with clinical observations, patient history, and epidemiological information. The expected result is Negative.  Fact Sheet for Patients:  EntrepreneurPulse.com.au  Fact Sheet for Healthcare Providers:  IncredibleEmployment.be  This test is no t yet approved or cleared by the [place] FDA and  has been authorized for detection and/or diagnosis of SARS-CoV-2 by FDA under an Emergency Use Authorization (EUA). This EUA will remain  in effect (meaning this test can be used) for the duration of the COVID-19 declaration under Section 564(b)(1) of the Act, 21 U.S.C.section 360bbb-3(b)(1), unless the authorization is terminated  or revoked sooner.       Influenza A by PCR NEGATIVE NEGATIVE Final   Influenza B by PCR NEGATIVE NEGATIVE Final    Comment: (NOTE) The Xpert Xpress SARS-CoV-2/FLU/RSV plus assay is intended as an aid in the diagnosis of influenza from Nasopharyngeal swab specimens and should not be used as a sole basis for treatment. Nasal washings and aspirates are unacceptable for Xpert Xpress SARS-CoV-2/FLU/RSV testing.  Fact Sheet for Patients: EntrepreneurPulse.com.au  Fact Sheet for Healthcare Providers: IncredibleEmployment.be  This test is not yet approved or cleared by the [place] FDA and has been authorized for detection and/or diagnosis of SARS-CoV-2 by FDA under an Emergency Use Authorization  (EUA). This EUA will remain in effect (meaning this test can be used) for the duration of the COVID-19 declaration under Section 564(b)(1) of the Act, 21 U.S.C. section 360bbb-3(b)(1), unless the authorization is terminated or revoked.  Performed at [facility], [place] [address], [place], [place] [address]       Studies: No results found.  Scheduled Meds:  allopurinol  200 mg Oral Daily   apixaban  5 mg Oral BID   budesonide  9 mg Oral Q breakfast   dapagliflozin propanediol  10 mg Oral QAC breakfast   darifenacin  7.5 mg Oral Daily   divalproex  1,500 mg Oral QHS  feeding supplement  237 mL Oral BID BM   ferrous sulfate  325 mg Oral BID WC   isosorbide mononitrate  60 mg Oral Daily   ivabradine  5 mg Oral BID WC   magnesium oxide  400 mg Oral Daily   metoprolol succinate  25 mg Oral BID   mirtazapine  7.5 mg Oral QHS   multivitamin with minerals  1 tablet Oral Daily   pantoprazole  40 mg Oral Daily   QUEtiapine  150 mg Oral QHS   tamsulosin  0.4 mg Oral Daily   vitamin B-12  1,000 mcg Oral Daily    Continuous Infusions:  lactated ringers Stopped (10/09/20 1452)     LOS: 6 days     Cristal Deer, MD [facility]  To reach me or the doctor on call, go to: [email or web address] Password [facility]  10/14/2020, 9:40 AM

## 2020-10-15 ENCOUNTER — Inpatient Hospital Stay (HOSPITAL_COMMUNITY): Admission: RE | Admit: 2020-10-15 | Payer: No Typology Code available for payment source | Source: Ambulatory Visit

## 2020-10-15 DIAGNOSIS — Z8616 Personal history of COVID-19: Secondary | ICD-10-CM | POA: Diagnosis not present

## 2020-10-15 DIAGNOSIS — D631 Anemia in chronic kidney disease: Secondary | ICD-10-CM | POA: Diagnosis not present

## 2020-10-15 DIAGNOSIS — J069 Acute upper respiratory infection, unspecified: Secondary | ICD-10-CM | POA: Diagnosis not present

## 2020-10-15 DIAGNOSIS — R5383 Other fatigue: Secondary | ICD-10-CM | POA: Diagnosis not present

## 2020-10-15 DIAGNOSIS — F319 Bipolar disorder, unspecified: Secondary | ICD-10-CM | POA: Diagnosis not present

## 2020-10-15 DIAGNOSIS — I480 Paroxysmal atrial fibrillation: Secondary | ICD-10-CM | POA: Diagnosis not present

## 2020-10-15 DIAGNOSIS — R2681 Unsteadiness on feet: Secondary | ICD-10-CM | POA: Diagnosis not present

## 2020-10-15 DIAGNOSIS — E878 Other disorders of electrolyte and fluid balance, not elsewhere classified: Secondary | ICD-10-CM | POA: Diagnosis not present

## 2020-10-15 DIAGNOSIS — R1312 Dysphagia, oropharyngeal phase: Secondary | ICD-10-CM | POA: Diagnosis not present

## 2020-10-15 DIAGNOSIS — I5022 Chronic systolic (congestive) heart failure: Secondary | ICD-10-CM | POA: Diagnosis not present

## 2020-10-15 DIAGNOSIS — R531 Weakness: Secondary | ICD-10-CM | POA: Diagnosis not present

## 2020-10-15 DIAGNOSIS — N1832 Chronic kidney disease, stage 3b: Secondary | ICD-10-CM

## 2020-10-15 DIAGNOSIS — K900 Celiac disease: Secondary | ICD-10-CM | POA: Diagnosis not present

## 2020-10-15 DIAGNOSIS — E876 Hypokalemia: Secondary | ICD-10-CM | POA: Diagnosis not present

## 2020-10-15 DIAGNOSIS — M6281 Muscle weakness (generalized): Secondary | ICD-10-CM | POA: Diagnosis not present

## 2020-10-15 DIAGNOSIS — Z7401 Bed confinement status: Secondary | ICD-10-CM | POA: Diagnosis not present

## 2020-10-15 DIAGNOSIS — Z7984 Long term (current) use of oral hypoglycemic drugs: Secondary | ICD-10-CM | POA: Diagnosis not present

## 2020-10-15 DIAGNOSIS — K5090 Crohn's disease, unspecified, without complications: Secondary | ICD-10-CM | POA: Diagnosis not present

## 2020-10-15 DIAGNOSIS — Z95810 Presence of automatic (implantable) cardiac defibrillator: Secondary | ICD-10-CM | POA: Diagnosis not present

## 2020-10-15 DIAGNOSIS — I10 Essential (primary) hypertension: Secondary | ICD-10-CM | POA: Diagnosis not present

## 2020-10-15 DIAGNOSIS — J449 Chronic obstructive pulmonary disease, unspecified: Secondary | ICD-10-CM | POA: Diagnosis not present

## 2020-10-15 DIAGNOSIS — Z87891 Personal history of nicotine dependence: Secondary | ICD-10-CM | POA: Diagnosis not present

## 2020-10-15 DIAGNOSIS — Z7901 Long term (current) use of anticoagulants: Secondary | ICD-10-CM | POA: Diagnosis not present

## 2020-10-15 DIAGNOSIS — U071 COVID-19: Secondary | ICD-10-CM | POA: Diagnosis not present

## 2020-10-15 DIAGNOSIS — M2550 Pain in unspecified joint: Secondary | ICD-10-CM | POA: Diagnosis not present

## 2020-10-15 DIAGNOSIS — K5000 Crohn's disease of small intestine without complications: Secondary | ICD-10-CM | POA: Diagnosis not present

## 2020-10-15 DIAGNOSIS — I130 Hypertensive heart and chronic kidney disease with heart failure and stage 1 through stage 4 chronic kidney disease, or unspecified chronic kidney disease: Secondary | ICD-10-CM | POA: Diagnosis not present

## 2020-10-15 DIAGNOSIS — M1A9XX0 Chronic gout, unspecified, without tophus (tophi): Secondary | ICD-10-CM | POA: Diagnosis not present

## 2020-10-15 DIAGNOSIS — E440 Moderate protein-calorie malnutrition: Secondary | ICD-10-CM | POA: Diagnosis not present

## 2020-10-15 DIAGNOSIS — R29818 Other symptoms and signs involving the nervous system: Secondary | ICD-10-CM | POA: Diagnosis not present

## 2020-10-15 DIAGNOSIS — N183 Chronic kidney disease, stage 3 unspecified: Secondary | ICD-10-CM | POA: Diagnosis not present

## 2020-10-15 DIAGNOSIS — E1122 Type 2 diabetes mellitus with diabetic chronic kidney disease: Secondary | ICD-10-CM | POA: Diagnosis not present

## 2020-10-15 DIAGNOSIS — I428 Other cardiomyopathies: Secondary | ICD-10-CM | POA: Diagnosis not present

## 2020-10-15 DIAGNOSIS — Z79899 Other long term (current) drug therapy: Secondary | ICD-10-CM | POA: Diagnosis not present

## 2020-10-15 LAB — BASIC METABOLIC PANEL
Anion gap: 5 (ref 5–15)
BUN: 25 mg/dL — ABNORMAL HIGH (ref 8–23)
CO2: 29 mmol/L (ref 22–32)
Calcium: 8.4 mg/dL — ABNORMAL LOW (ref 8.9–10.3)
Chloride: 109 mmol/L (ref 98–111)
Creatinine, Ser: 1.9 mg/dL — ABNORMAL HIGH (ref 0.61–1.24)
GFR, Estimated: 36 mL/min — ABNORMAL LOW (ref >60)
Glucose, Bld: 82 mg/dL (ref 70–99)
Potassium: 3.5 mmol/L (ref 3.5–5.1)
Sodium: 143 mmol/L (ref 135–145)

## 2020-10-15 LAB — CBC
HCT: 32.2 % — ABNORMAL LOW (ref 39.0–52.0)
Hemoglobin: 10.3 g/dL — ABNORMAL LOW (ref 13.0–17.0)
MCH: 33.7 pg (ref 26.0–34.0)
MCHC: 32 g/dL (ref 30.0–36.0)
MCV: 105.2 fL — ABNORMAL HIGH (ref 80.0–100.0)
Platelets: 140 10*3/uL — ABNORMAL LOW (ref 150–400)
RBC: 3.06 MIL/uL — ABNORMAL LOW (ref 4.22–5.81)
RDW: 12.8 % (ref 11.5–15.5)
WBC: 9.3 10*3/uL (ref 4.0–10.5)
nRBC: 0 % (ref 0.0–0.2)

## 2020-10-15 LAB — RESP PANEL BY RT-PCR (FLU A&B, COVID) ARPGX2
Influenza A by PCR: NEGATIVE
Influenza B by PCR: NEGATIVE
SARS Coronavirus 2 by RT PCR: NEGATIVE

## 2020-10-15 MED ORDER — ENSURE ENLIVE PO LIQD: 237.0000 mL | Freq: Two times a day (BID) | ORAL | Status: DC

## 2020-10-15 MED ORDER — POTASSIUM CHLORIDE CRYS ER 20 MEQ PO TBCR
20.0000 meq | EXTENDED_RELEASE_TABLET | Freq: Once | ORAL | Status: AC
  Administered 2020-10-15: 20 meq via ORAL
  Filled 2020-10-15: qty 1

## 2020-10-15 MED ORDER — ADULT MULTIVITAMIN W/MINERALS CH: 1.0000 | ORAL_TABLET | Freq: Every day | ORAL | Status: AC

## 2020-10-15 MED ORDER — ACETAMINOPHEN 160 MG/5ML PO LIQD: 325.0000 mg | Freq: Four times a day (QID) | ORAL | Status: DC | PRN

## 2020-10-15 NOTE — Plan of Care (Signed)

## 2020-10-15 NOTE — Discharge Summary (Signed)
Physician Discharge Summary  Brent [name] EXH:[id number] DOB: April 06, 1945  PCP: Wardell Honour, MD  Admitted from: Home Discharged to: SNF  Admit date: 10/08/2020 Discharge date: 10/15/2020  Recommendations for Outpatient Follow-up:    Follow-up Information     MD at SNF Follow up.   Why: To be seen in 2 to 3 days with repeat labs (CBC & BMP).        Wardell Honour, MD. Schedule an appointment as soon as possible for a visit.   Specialties: Family Medicine, Emergency Medicine Contact information: [place] [address] [phone]         Martinique, Peter M, MD .   Specialty: Cardiology Contact information: [address] [place] [address] [phone]         Larey Dresser, MD. Schedule an appointment as soon as possible for a visit.   Specialty: Cardiology Contact information: [place] [address] [phone]         Elmarie Shiley, MD. Schedule an appointment as soon as possible for a visit in 2 week(s).   Specialty: Nephrology Contact information: [place] [address] [phone]                  Home Health: None    Equipment/Devices: TBD at SNF    Discharge Condition: Improved and stable   Code Status: Full Code Diet recommendation:  Discharge Diet Orders (From admission, onward)     Start     Ordered   10/15/20 0000  Diet - low sodium heart healthy        10/15/20 1240             Discharge Diagnoses:  Principal Problem:   Hypokalemia Active Problems:   Anemia in chronic kidney disease   Essential hypertension, benign   Chronic systolic CHF (congestive heart failure) (HCC)   Weakness   ICD (implantable cardioverter-defibrillator) in place   Hypomagnesemia   CKD (chronic kidney disease), stage III ([place])   COVID-19 virus infection   Brief Narrative:  75 year old male with medical history significant for chronic systolic CHF, hypertension, CKD stage  IIIb, Crohn's disease, on chronic anticoagulation/Eliquis, ICD in place, follows up with the [place] [facility], was seen at the clinic and noted to have low potassium and generalized weakness and referred to the ED for admission.  He recently had COVID 19 infection 10 days prior to admission, and also developed diarrhea 4 to 5 days prior to admission with multiple watery nonbloody diarrhea.  Patient was admitted for hypokalemia and hypocalcemia.  Electrolytes were aggressively replaced.  These were felt to be due to recent diarrhea due to his Crohn's disease, poor oral intake in the context of recent COVID-19 infection.  Assessment and plan:  Multiple electrolytes imbalance with hypokalemia, hypomagnesemia and hypophosphatemia: Electrolytes were aggressively repleted and now stabilized and normal. Likely from his recent diarrhea due to his Crohn's disease,poor oral intake in light of recent COVID +.  Encourage oral hydration. Potassium was slightly low at 3.5, provided oral potassium chloride 20 mEq x 1 dose prior to discharge.  Anemia of chronic kidney disease: Hemoglobin overall stable.  continue iron supplementation and gets monthly Epogen, likely under the guidance of his nephrologist.  Follow CBC as outpatient periodically.  Thrombocytopenia: Unclear etiology.  Platelet count stable in the 140s.?  Related to recent COVID.  Follow CBC closely as outpatient.  Chronic systolic CHF with EF 80-99% in 2021/ICD in  place/On chronic eliquis Clinically euvolemic.  Resume prior to admission diuretics and other cardiac meds.  Outpatient follow-up with cardiology.  Essential hypertension Blood pressures minimally elevated at times but otherwise mostly stable. Continue Imdur, metoprolol. Also on CHF> Farxiga, ivarbradine as well as Eliquis.Monitor intake output Daily weight.   Generalized weakness due to multiple electrolyte imbalance in the context of recent COVID-19 infection in an elderly male with multiple  severe significant chronic comorbidities.  SNF pending  CKD stage IIIb: Renal function appears stable at baseline at 1.8 - 1.9.  Creatinine is at baseline on day of discharge.  Appears that he follows with Dr. Elmarie Shiley, Nephrology.   COVID-19 infection recently -was positive August 4 and off isolation.  He had a Booster.  Stable without hypoxia.  History of Crohn's disease continue Entocort, Remicade.  Has had recent nonbloody diarrhea.  Reports he gets diarrhea on and off.  No further diarrhea reported.  Bipolar disorder on Depakote Seroquel and Remeron  Gout continue allopurinol  Nutritional status  inadequate oral intake, new BMI at 20.3  Nutrition Status: Nutrition Problem: Inadequate oral intake Etiology: acute illness (recent COVID-19 infection) Signs/Symptoms: per patient/family report Interventions: Ensure Enlive (each supplement provides 350kcal and 20 grams of protein), Magic cup, MVI    Consultations: None  Procedures: None   Discharge Instructions  Discharge Instructions     (HEART FAILURE PATIENTS) Call MD:  Anytime you have any of the following symptoms: 1) 3 pound weight gain in 24 hours or 5 pounds in 1 week 2) shortness of breath, with or without a dry hacking cough 3) swelling in the hands, feet or stomach 4) if you have to sleep on extra pillows at night in order to breathe.   Complete by: As directed    Call MD for:  difficulty breathing, headache or visual disturbances   Complete by: As directed    Call MD for:  extreme fatigue   Complete by: As directed    Call MD for:  persistant dizziness or light-headedness   Complete by: As directed    Call MD for:  persistant nausea and vomiting   Complete by: As directed    Call MD for:  severe uncontrolled pain   Complete by: As directed    Call MD for:  temperature >100.4   Complete by: As directed    Diet - low sodium heart healthy   Complete by: As directed    Increase activity slowly   Complete by: As  directed         Medication List     TAKE these medications    acetaminophen 160 MG/5ML liquid Commonly known as: TYLENOL Take 10.2 mLs (325 mg total) by mouth every 6 (six) hours as needed for fever or pain. What changed:  when to take this reasons to take this   allopurinol 100 MG tablet Commonly known as: ZYLOPRIM Take 2 tablets (200 mg total) by mouth daily.   apixaban 5 MG Tabs tablet Commonly known as: ELIQUIS Take 1 tablet (5 mg total) by mouth 2 (two) times daily.   BUDESONIDE PO Take 9 mg by mouth daily.   Calcium 500 MG tablet Take 1 tablet (500 mg total) by mouth 1 day or 1 dose for 1 dose.   dapagliflozin propanediol 10 MG Tabs tablet Commonly known as: Farxiga Take 1 tablet (10 mg total) by mouth daily before breakfast.   Depakote ER 500 MG 24 hr tablet Generic drug: divalproex Take 1,500 mg by  mouth at bedtime.   epoetin alfa 10000 UNIT/ML injection Commonly known as: EPOGEN Inject 10,000 Units into the skin every 30 (thirty) days.   feeding supplement Liqd Take 237 mLs by mouth 2 (two) times daily between meals.   ferrous sulfate 325 (65 FE) MG tablet Take 325 mg by mouth in the morning and at bedtime.   furosemide 20 MG tablet Commonly known as: LASIX Take 1 tablet (20 mg total) by mouth every Monday, Wednesday, and Friday.   hydrALAZINE 25 MG tablet Commonly known as: APRESOLINE Take 25 mg by mouth 3 (three) times daily as needed (High BP, SBP>130).   inFLIXimab 100 MG injection Commonly known as: Remicade Infuse Remicade IV schedule 1 14m/kg every 8 weeks Premedicate with Tylenol 500-6551mby mouth and Benadryl 25-5071my mouth prior to infusion. Last PPD was on 12/2009.   isosorbide mononitrate 60 MG 24 hr tablet Commonly known as: IMDUR Take 1 tablet (60 mg total) by mouth daily.   ivabradine 5 MG Tabs tablet Commonly known as: [place] TAKE 1 TABLET(5 MG) BY MOUTH TWICE DAILY WITH A MEAL What changed:  how much to take how  to take this when to take this additional instructions   loperamide 2 MG capsule Commonly known as: IMODIUM Take 2 mg by mouth 3 (three) times daily as needed for diarrhea or loose stools.   Magnesium 400 MG Tabs Take 400 mg by mouth daily.   metoprolol succinate 25 MG 24 hr tablet Commonly known as: TOPROL-XL Take 1 tablet (25 mg total) by mouth in the morning and at bedtime.   mirtazapine 7.5 MG tablet Commonly known as: REMERON TAKE 1 TABLET(7.5 MG) BY MOUTH AT BEDTIME What changed: See the new instructions.   multivitamin with minerals Tabs tablet Take 1 tablet by mouth daily. Start taking on: October 16, 2020   NON FORMULARY Mucositis mouth wash- uses as needed   omeprazole 20 MG capsule Commonly known as: PRILOSEC Take 20 mg by mouth daily.   QUEtiapine 100 MG tablet Commonly known as: SEROQUEL Take 150 mg by mouth at bedtime.   solifenacin 5 MG tablet Commonly known as: VESICARE Take 5 mg by mouth daily.   tamsulosin 0.4 MG Caps capsule Commonly known as: FLOMAX Take 1 capsule (0.4 mg total) by mouth daily.   vitamin B-12 500 MCG tablet Commonly known as: CYANOCOBALAMIN Take 1,000 mcg by mouth daily.   Vitamin D 125 MCG (5000 UT) Caps Take 5,000 Units by mouth daily.       Allergies  Allergen Reactions   Azathioprine Other (See Comments)    REACTION: affected WBC "Almost died"   Ciprofloxacin Other (See Comments)    Reaction not recalled   Levaquin [Levofloxacin In D5w] Other (See Comments)    Reaction not rec   Plendil [Felodipine] Other (See Comments)    Reaction not not recalled      Procedures/Studies: DG Chest Portable 1 View  Result Date: 10/08/2020 CLINICAL DATA:  Weakness EXAM: PORTABLE CHEST 1 VIEW COMPARISON:  08/14/2020 FINDINGS: Left-sided pacing device. No focal opacity or pleural effusion. Normal cardiomediastinal silhouette. No pneumothorax IMPRESSION: No active disease. Electronically Signed   By: KimDonavan FoilD.   On:  10/08/2020 20:56   Subjective: Patient reports feeling generally weak, having difficulty walking because of same.  And stated that "waiting for transfer to rehab".  Denies any other complaints.  Has consumed 100% of his breakfast.  No dyspnea, cough, chest pain, dizziness, lightheadedness, nausea, vomiting or diarrhea.  Reportedly up in the chair for extended periods of time and ambulated in the room over the weekend.  Discharge Exam:  Vitals:   10/15/20 0317 10/15/20 0342 10/15/20 0800 10/15/20 1226  BP: (!) 151/82  (!) 148/79 138/78  Pulse: 71  79 83  Resp: 19  20 20   Temp: 97.9 F (36.6 C)  98.5 F (36.9 C) 98.4 F (36.9 C)  TempSrc:   Axillary Oral  SpO2: 96%  97% 97%  Weight:  73 kg    Height:        General: Pt lying comfortably in bed & appears in no obvious distress.  Elderly male, moderately built, frail, chronically ill looking in no obvious distress, does not look septic or toxic. Cardiovascular: S1 & S2 heard, RRR, S1/S2 +. No murmurs, rubs, gallops or clicks. No JVD or pedal edema.  Telemetry personally reviewed: Sinus rhythm. Respiratory: Clear to auscultation without wheezing, rhonchi or crackles. No increased work of breathing. Abdominal:  Non distended, non tender & soft. No organomegaly or masses appreciated. Normal bowel sounds heard. CNS: Alert and oriented. No focal deficits. Extremities: no edema, no cyanosis    The results of significant diagnostics from this hospitalization (including imaging, microbiology, ancillary and laboratory) are listed below for reference.     Microbiology: Recent Results (from the past 240 hour(s))  Resp Panel by RT-PCR (Flu A&B, Covid) Nasopharyngeal Swab     Status: None   Collection Time: 10/08/20 10:44 PM   Specimen: Nasopharyngeal Swab; Nasopharyngeal(NP) swabs in vial transport medium  Result Value Ref Range Status   SARS Coronavirus 2 by RT PCR NEGATIVE NEGATIVE Final    Comment: (NOTE) SARS-CoV-2 target nucleic acids  are NOT DETECTED.  The SARS-CoV-2 RNA is generally detectable in upper respiratory specimens during the acute phase of infection. The lowest concentration of SARS-CoV-2 viral copies this assay can detect is 138 copies/mL. A negative result does not preclude SARS-Cov-2 infection and should not be used as the sole basis for treatment or other patient management decisions. A negative result may occur with  improper specimen collection/handling, submission of specimen other than nasopharyngeal swab, presence of viral mutation(s) within the areas targeted by this assay, and inadequate number of viral copies(<138 copies/mL). A negative result must be combined with clinical observations, patient history, and epidemiological information. The expected result is Negative.  Fact Sheet for Patients:  EntrepreneurPulse.com.au  Fact Sheet for Healthcare Providers:  IncredibleEmployment.be  This test is no t yet approved or cleared by the [place] FDA and  has been authorized for detection and/or diagnosis of SARS-CoV-2 by FDA under an Emergency Use Authorization (EUA). This EUA will remain  in effect (meaning this test can be used) for the duration of the COVID-19 declaration under Section 564(b)(1) of the Act, 21 U.S.C.section 360bbb-3(b)(1), unless the authorization is terminated  or revoked sooner.       Influenza A by PCR NEGATIVE NEGATIVE Final   Influenza B by PCR NEGATIVE NEGATIVE Final    Comment: (NOTE) The Xpert Xpress SARS-CoV-2/FLU/RSV plus assay is intended as an aid in the diagnosis of influenza from Nasopharyngeal swab specimens and should not be used as a sole basis for treatment. Nasal washings and aspirates are unacceptable for Xpert Xpress SARS-CoV-2/FLU/RSV testing.  Fact Sheet for Patients: EntrepreneurPulse.com.au  Fact Sheet for Healthcare Providers: IncredibleEmployment.be  This test is  not yet approved or cleared by the [place] FDA and has been authorized for detection and/or diagnosis of SARS-CoV-2 by FDA under an  Emergency Use Authorization (EUA). This EUA will remain in effect (meaning this test can be used) for the duration of the COVID-19 declaration under Section 564(b)(1) of the Act, 21 U.S.C. section 360bbb-3(b)(1), unless the authorization is terminated or revoked.  Performed at [facility], [place] [address], [place], [place] [address]      Labs: CBC: Recent Labs  Lab 10/08/20 1855 10/10/20 0154 10/11/20 0342 10/12/20 0650 10/15/20 0723  WBC 12.0* 7.7 8.4 9.0 9.3  NEUTROABS 8.5* 5.0  --   --   --   HGB 11.6* 10.0* 9.9* 9.9* 10.3*  HCT 37.1* 31.7* 30.8* 30.6* 32.2*  MCV 106.3* 104.3* 105.5* 103.4* 105.2*  PLT 222 155 147* 140* 140*    Basic Metabolic Panel: Recent Labs  Lab 10/08/20 1855 10/09/20 0435 10/10/20 0154 10/11/20 0342 10/12/20 0650 10/15/20 0723  NA 142 141 142 144 143 143  K 2.7* 3.3* 3.9 3.9 3.8 3.5  CL 108 111 114* 109 113* 109  CO2 25 23 22 25 23 29   GLUCOSE 114* 94 119* 101* 78 82  BUN 29* 27* 22 19 20  25*  CREATININE 2.24* 2.06* 1.81* 1.92* 1.79* 1.90*  CALCIUM 8.5* 8.5* 8.5* 8.9 8.3* 8.4*  MG 1.3* 1.7 1.4* 2.0  --   --   PHOS  --   --  1.6* 3.8  --   --     Liver Function Tests: Recent Labs  Lab 10/08/20 1855 10/09/20 0439 10/10/20 0154  AST 13*  --  11*  ALT 10  --  9  ALKPHOS 67  --  49  BILITOT 0.5  --  0.7  PROT 6.9  --  5.8*  ALBUMIN 2.7* 2.5* 2.2*    Urinalysis    Component Value Date/Time   COLORURINE YELLOW 10/09/2020 0554   APPEARANCEUR CLEAR 10/09/2020 0554   LABSPEC 1.011 10/09/2020 0554   PHURINE 6.0 10/09/2020 0554   GLUCOSEU NEGATIVE 10/09/2020 0554   HGBUR NEGATIVE 10/09/2020 0554   BILIRUBINUR NEGATIVE 10/09/2020 0554   BILIRUBINUR Positive 04/26/2020 1504   KETONESUR NEGATIVE 10/09/2020 0554   PROTEINUR NEGATIVE 10/09/2020 0554   UROBILINOGEN negative (A) 04/26/2020  1504   UROBILINOGEN 0.2 07/10/2010 1709   NITRITE NEGATIVE 10/09/2020 0554   LEUKOCYTESUR NEGATIVE 10/09/2020 0554      Time coordinating discharge: 35 minutes  SIGNED:  Vernell Leep, MD, FACP, [facility]. [facility]  To contact the attending provider between 7A-7P or the covering provider during after hours 7P-7A, please log into the web site [email or web address] and access using universal [place] password for that web site. If you do not have the password, please call the hospital operator.

## 2020-10-15 NOTE — TOC Transition Note (Signed)
Transition of Care [facility]) - CM/SW Discharge Note   Patient Details  Name: Brent [name] MRN: [id number] Date of Birth: 06-Jul-1945  [facility] CM/SW Contact:  Coralee Pesa, [place] Phone Number: 10/15/2020, 1:53 PM   Clinical Narrative:    Pt to be transported to [facility] via [place]. Nurse to call report to [phone].   Final next level of care: Skilled Nursing Facility Barriers to Discharge: Continued Medical Work up, Ship broker   Patient Goals and CMS Choice Patient states their goals for this hospitalization and ongoing recovery are:: Tol get better CMS Medicare.gov Compare Post Acute Care list provided to:: Patient Choice offered to / list presented to : Patient  Discharge Placement              Patient chooses bed at: [facility] Patient to be transferred to facility by: [place] Name of family member notified: Rosa Patient and family notified of of transfer: 10/15/20  Discharge Plan and Services                                     Social Determinants of Health (SDOH) Interventions     Readmission Risk Interventions No flowsheet data found.

## 2020-10-15 NOTE — Progress Notes (Signed)
[facility] here to transport pt to [place].

## 2020-10-15 NOTE — Progress Notes (Signed)
Report called to [facility] and given to nurse [place].

## 2020-10-16 ENCOUNTER — Encounter: Payer: Self-pay | Admitting: Adult Health

## 2020-10-16 ENCOUNTER — Non-Acute Institutional Stay (SKILLED_NURSING_FACILITY): Payer: Medicare Other | Admitting: Adult Health

## 2020-10-16 ENCOUNTER — Telehealth: Payer: Self-pay

## 2020-10-16 DIAGNOSIS — I480 Paroxysmal atrial fibrillation: Secondary | ICD-10-CM | POA: Diagnosis not present

## 2020-10-16 DIAGNOSIS — R531 Weakness: Secondary | ICD-10-CM | POA: Diagnosis not present

## 2020-10-16 DIAGNOSIS — E878 Other disorders of electrolyte and fluid balance, not elsewhere classified: Secondary | ICD-10-CM | POA: Diagnosis not present

## 2020-10-16 DIAGNOSIS — I10 Essential (primary) hypertension: Secondary | ICD-10-CM | POA: Diagnosis not present

## 2020-10-16 DIAGNOSIS — K5000 Crohn's disease of small intestine without complications: Secondary | ICD-10-CM

## 2020-10-16 DIAGNOSIS — D631 Anemia in chronic kidney disease: Secondary | ICD-10-CM

## 2020-10-16 DIAGNOSIS — I5022 Chronic systolic (congestive) heart failure: Secondary | ICD-10-CM

## 2020-10-16 DIAGNOSIS — E1122 Type 2 diabetes mellitus with diabetic chronic kidney disease: Secondary | ICD-10-CM | POA: Diagnosis not present

## 2020-10-16 DIAGNOSIS — N1832 Chronic kidney disease, stage 3b: Secondary | ICD-10-CM | POA: Diagnosis not present

## 2020-10-16 DIAGNOSIS — F319 Bipolar disorder, unspecified: Secondary | ICD-10-CM

## 2020-10-16 DIAGNOSIS — J069 Acute upper respiratory infection, unspecified: Secondary | ICD-10-CM | POA: Diagnosis not present

## 2020-10-16 NOTE — Progress Notes (Signed)
Location:  [facility] Room Number: 211 A Place of Service:  SNF (31) Provider:  Durenda Age, DNP, FNP-BC  Patient Care Team: Wardell Honour, MD as PCP - General (Family Medicine) Martinique, Peter M, MD as PCP - Cardiology (Cardiology) Larey Dresser, MD as PCP - Advanced Heart Failure (Cardiology) Elmarie Shiley, MD (Nephrology) Milus Banister, MD (Gastroenterology) Jorge Ny, LCSW as Social Worker (Licensed Clinical Social Worker)  Extended Emergency Contact Information Primary Emergency Contact: [place] Phone: [phone] Mobile Phone: [phone] Relation: Sister Secondary Emergency Contact: [facility] Mobile Phone: [phone] Relation: Friend  Code Status:    Goals of care: Advanced Directive information Advanced Directives 10/09/2020  Does Patient Have a Medical Advance Directive? No  Type of Advance Directive -  Does patient want to make changes to medical advance directive? -  Copy of [employer] in Chart? -  Would patient like information on creating a medical advance directive? Yes (Inpatient - patient requests chaplain consult to create a medical advance directive)  Pre-existing out of facility DNR order (yellow form or pink MOST form) -     Chief Complaint  Patient presents with   Hospitalization Follow-up    HPI:  Pt is a 75 y.o. male who was admitted to [facility] post hospitalization 10/08/2020 to 10/15/2020.  He has a PMH of chronic systolic CHF, hypertension, CKD stage IIIb, Crohn's disease, on chronic anticoagulation/Eliquis and ICD in place. He was seen at the [facility] clinic for a follow-up, was noted to have low potassium and generalized weakness so he was referred to ED.  He had COVID-19 infection 10 days prior to ED referral.  He developed diarrhea 4 to 5 days prior to hospitalization with multiple nonbloody diarrhea. Labs showed K 2.7 and Mg 1.3.  He was not able to  ambulate on his own.  Chest x-ray was negative for infiltrate or consolidations.  WBC at 12,000, hemoglobin 11.6, hematocrit 7.1, sodium 142, creatinine 2.24, BUN 29, alk phos 67,  AST 13, ALT 10.  Electrolyte imbalance were felt to be due to recent diarrhea due to his Crohn's disease, poor oral intake in the context of recent COVID-19 infection.  He was given IV magnesium and potassium.  He was seen in the room today. He does not recall when he had his Remicade for his gout. He stated that he has been having weakness and has fallen twice outside his apartment complex before hospitalization.    Past Medical History:  Diagnosis Date   Anemia    Anxiety    Benign paroxysmal positional vertigo    Bipolar I disorder, most recent episode (or current) unspecified    Celiac disease    CHF (congestive heart failure) (HCC)    Chronic kidney disease, stage III (moderate) (HCC)    Crohn's    Remicade q8 weeks   Depression    Essential and other specified forms of tremor    medication-induced Parkinson's, now resolved   Essential hypertension, benign    Gout 2018   Heart failure ([place])    Hypertrophy of prostate without urinary obstruction and other lower urinary tract symptoms (LUTS)    Impotence of organic origin    Insomnia with sleep apnea, unspecified    Iron deficiency anemia, unspecified    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   Neuralgia, neuritis, and radiculitis, unspecified    Other B-complex deficiencies    Other extrapyramidal disease and abnormal movement disorder    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[place])    Tobacco use disorder    Vertigo 2018   Past Surgical History:  Procedure Laterality Date   CATARACT EXTRACTION, BILATERAL Bilateral 09/2018   CHOLECYSTECTOMY  07-12-2010   ICD IMPLANT N/A 01/02/2020   Procedure: ICD IMPLANT;  Surgeon: Evans Lance, MD;  Location: [facility] LAB;  Service: Cardiovascular;  Laterality: N/A;   RIGHT HEART CATH N/A 10/12/2018   Procedure: RIGHT HEART  CATH;  Surgeon: Jolaine Artist, MD;  Location: [facility];  Service: Cardiovascular;  Laterality: N/A;   RIGHT/LEFT HEART CATH AND CORONARY ANGIOGRAPHY N/A 04/26/2019   Procedure: RIGHT/LEFT HEART CATH AND CORONARY ANGIOGRAPHY;  Surgeon: Larey Dresser, MD;  Location: [facility];  Service: Cardiovascular;  Laterality: N/A;   SMALL INTESTINE SURGERY     x 2    Allergies  Allergen Reactions   Azathioprine Other (See Comments)    REACTION: affected WBC "Almost died"   Ciprofloxacin Other (See Comments)    Reaction not recalled   Levaquin [Levofloxacin In D5w] Other (See Comments)    Reaction not rec   Plendil [Felodipine] Other (See Comments)    Reaction not not recalled    Outpatient Encounter Medications as of 10/16/2020  Medication Sig   acetaminophen (TYLENOL) 160 MG/5ML liquid Take 10.2 mLs (325 mg total) by mouth every 6 (six) hours as needed for fever or pain.   allopurinol (ZYLOPRIM) 100 MG tablet Take 2 tablets (200 mg total) by mouth daily.   apixaban (ELIQUIS) 5 MG TABS tablet Take 1 tablet (5 mg total) by mouth 2 (two) times daily.   BUDESONIDE PO Take 9 mg by mouth daily.   Calcium 500 MG tablet Take 1 tablet (500 mg total) by mouth 1 day or 1 dose for 1 dose.   Cholecalciferol (VITAMIN D) 125 MCG (5000 UT) CAPS Take 5,000 Units by mouth daily.    dapagliflozin propanediol (FARXIGA) 10 MG TABS tablet Take 1 tablet (10 mg total) by mouth daily before breakfast.   divalproex (DEPAKOTE ER) 500 MG 24 hr tablet Take 1,500 mg by mouth at bedtime.    epoetin alfa (EPOGEN) 10000 UNIT/ML injection Inject 10,000 Units into the skin every 30 (thirty) days.   feeding supplement (ENSURE ENLIVE / ENSURE PLUS) LIQD Take 237 mLs by mouth 2 (two) times daily between meals.   ferrous sulfate 325 (65 FE) MG tablet Take 325 mg by mouth in the morning and at bedtime.   furosemide (LASIX) 20 MG tablet Take 1 tablet (20 mg total) by mouth every Monday, Wednesday, and Friday.    hydrALAZINE (APRESOLINE) 25 MG tablet Take 25 mg by mouth 3 (three) times daily as needed (High BP, SBP>130).   inFLIXimab (REMICADE) 100 MG injection Infuse Remicade IV schedule 1 82m/kg every 8 weeks Premedicate with Tylenol 500-6565mby mouth and Benadryl 25-5068my mouth prior to infusion. Last PPD was on 12/2009.    isosorbide mononitrate (IMDUR) 60 MG 24 hr tablet Take 1 tablet (60 mg total) by mouth daily.   ivabradine (CORLANOR) 5 MG TABS tablet TAKE 1 TABLET(5 MG) BY MOUTH TWICE DAILY WITH A MEAL (Patient taking differently: Take 5 mg by mouth 2 (two) times daily with a meal.)   loperamide (IMODIUM) 2 MG capsule Take 2 mg by mouth 3 (three) times daily as needed for diarrhea or loose stools.   Magnesium 400 MG TABS Take 400 mg by mouth daily.   metoprolol succinate (TOPROL-XL) 25 MG 24 hr tablet Take  1 tablet (25 mg total) by mouth in the morning and at bedtime.   mirtazapine (REMERON) 7.5 MG tablet TAKE 1 TABLET(7.5 MG) BY MOUTH AT BEDTIME (Patient taking differently: Take 7.5 mg by mouth at bedtime. TAKE 1 TABLET(7.5 MG) BY MOUTH AT BEDTIME)   Multiple Vitamin (MULTIVITAMIN WITH MINERALS) TABS tablet Take 1 tablet by mouth daily.   NON FORMULARY Mucositis mouth wash- uses as needed   omeprazole (PRILOSEC) 20 MG capsule Take 20 mg by mouth daily.   QUEtiapine (SEROQUEL) 100 MG tablet Take 150 mg by mouth at bedtime.   solifenacin (VESICARE) 5 MG tablet Take 5 mg by mouth daily.   tamsulosin (FLOMAX) 0.4 MG CAPS capsule Take 1 capsule (0.4 mg total) by mouth daily.   vitamin B-12 (CYANOCOBALAMIN) 500 MCG tablet Take 1,000 mcg by mouth daily.    No facility-administered encounter medications on file as of 10/16/2020.    Review of Systems  GENERAL: No change in appetite, no fatigue, no weight changes, no fever or chills  MOUTH and THROAT: Denies oral discomfort, gingival pain or bleeding, RESPIRATORY: no cough, SOB, DOE, wheezing, hemoptysis CARDIAC: No chest pain, edema or  palpitations GI: +diarrhea GU: Denies dysuria, frequency, hematuria, incontinence, or discharge NEUROLOGICAL: Denies dizziness, syncope, numbness, or headache PSYCHIATRIC: Denies feelings of depression or anxiety. No report of hallucinations, insomnia, paranoia, or agitation   Immunization History  Administered Date(s) Administered   Fluad Quad(high Dose 65+) 11/15/2018, 11/17/2019   H1N1 02/07/2008   Influenza, High Dose Seasonal PF 11/26/2016, 12/15/2017   Influenza, Seasonal, Injecte, Preservative Fre 12/12/2008, 11/07/2009, 11/04/2010   Influenza,inj,Quad PF,6+ Mos 11/13/2014, 01/03/2016   Influenza-Unspecified 01/11/1997, 12/15/1997, 03/02/2000, 01/02/2003, 12/18/2003, 12/30/2004, 12/10/2005, 12/25/2005, 01/11/2007, 02/07/2008, 12/08/2011, 10/25/2012, 12/16/2012, 12/09/2013, 11/28/2014, 12/31/2015, 11/24/2016, 10/29/2017, 11/24/2017, 12/15/2017, 11/25/2018   Moderna Sars-Covid-2 Vaccination 04/20/2019, 05/18/2019, 10/25/2019   PPD Test 12/30/2010, 01/05/2012, 01/07/2013, 01/13/2014   Pneumococcal Conjugate-13 01/03/2014, 08/10/2014   Pneumococcal Polysaccharide-23 03/02/2000, 03/05/2004, 03/27/2005, 01/09/2015, 01/03/2016, 05/06/2018   Pneumococcal-Unspecified 12/25/2005   Tdap 05/12/2011, 05/26/2011   Zoster Recombinat (Shingrix) 05/13/2018, 04/01/2019   Pertinent  Health Maintenance Due  Topic Date Due   HEMOGLOBIN A1C  02/27/2020   FOOT EXAM  08/09/2020   INFLUENZA VACCINE  09/24/2020   OPHTHALMOLOGY EXAM  06/28/2021   COLONOSCOPY (Pts 45-61yr Insurance coverage will need to be confirmed)  09/17/2027   PNA vac Low Risk Adult  Completed   Fall Risk  07/26/2020 04/26/2020 02/02/2020 01/24/2020 12/16/2019  Falls in the past year? 0 0 0 0 -  Number falls in past yr: 0 0 0 0 1  Injury with Fall? 0 0 0 0 0  Risk for fall due to : - - - - -  Risk for fall due to: Comment - - - - -  Follow up - - - - -     Vitals:   10/16/20 1300  BP: (!) 147/81  Pulse: 71  Resp: 18  Temp:  (!) 97.4 F (36.3 C)  Weight: 161 lb (73 kg)  Height: 6' 4" (1.93 m)   Body mass index is 19.6 kg/m.  Physical Exam  GENERAL APPEARANCE:  In no acute distress.  SKIN:  Skin is warm and dry.  MOUTH and THROAT: Lips are without lesions. Oral mucosa is moist and without lesions.  RESPIRATORY: Breathing is even & unlabored, BS CTAB CARDIAC: RRR, no murmur,no extra heart sounds, no edema. ICD on left chest. GI: Abdomen soft, normal BS, no masses, no tenderness EXTREMITIES:  Able to move X  4 extremities. NEUROLOGICAL: There is no tremor. Speech is clear. Alert and oriented X 3. PSYCHIATRIC:  Affect and behavior are appropriate  Labs reviewed: Recent Labs    10/09/20 0435 10/10/20 0154 10/11/20 0342 10/12/20 0650 10/15/20 0723  NA 141 142 144 143 143  K 3.3* 3.9 3.9 3.8 3.5  CL 111 114* 109 113* 109  CO2 _0 GLUCOSE 94 119* 101* 78 82  BUN 27* _1 25*  CREATININE 2.06* 1.81* 1.92* 1.79* 1.90*  CALCIUM 8.5* 8.5* 8.9 8.3* 8.4*  MG 1.7 1.4* 2.0  --   --   PHOS  --  1.6* 3.8  --   --    Recent Labs    08/14/20 1017 10/08/20 1855 10/09/20 0439 10/10/20 0154  AST 9* 13*  --  11*  ALT 8 10  --  9  ALKPHOS 57 67  --  49  BILITOT 0.5 0.5  --  0.7  PROT 6.1* 6.9  --  5.8*  ALBUMIN 2.5* 2.7* 2.5* 2.2*   Recent Labs    08/14/20 1017 09/14/20 1105 10/08/20 1855 10/10/20 0154 10/11/20 0342 10/12/20 0650 10/15/20 0723  WBC 10.1  --  12.0* 7.7 8.4 9.0 9.3  NEUTROABS 5.9  --  8.5* 5.0  --   --   --   HGB 11.4*   < > 11.6* 10.0* 9.9* 9.9* 10.3*  HCT 36.0*   < > 37.1* 31.7* 30.8* 30.6* 32.2*  MCV 107.8*  --  106.3* 104.3* 105.5* 103.4* 105.2*  PLT 108*  --  222 155 147* 140* 140*   < > = values in this interval not displayed.   Lab Results  Component Value Date   TSH 2.46 10/20/2019   Lab Results  Component Value Date   HGBA1C 5.1 08/27/2019   Lab Results  Component Value Date   CHOL 161 10/20/2019   HDL 54 10/20/2019   LDLCALC 86 10/20/2019    TRIG 112 10/20/2019   CHOLHDL 3.0 10/20/2019    Significant Diagnostic Results in last 30 days:  DG Chest Portable 1 View  Result Date: 10/08/2020 CLINICAL DATA:  Weakness EXAM: PORTABLE CHEST 1 VIEW COMPARISON:  08/14/2020 FINDINGS: Left-sided pacing device. No focal opacity or pleural effusion. Normal cardiomediastinal silhouette. No pneumothorax IMPRESSION: No active disease. Electronically Signed   By: Donavan Foil M.D.   On: 10/08/2020 20:56   CUP PACEART REMOTE DEVICE CHECK  Result Date: 10/02/2020 Scheduled remote reviewed. Normal device function.  Next remote 91 days- JBox, RN/CVRS   Assessment/Plan  1. Electrolyte imbalance Lab Results  Component Value Date   K 3.5 10/15/2020   -  Mg 2.0 -  thought to be due to Chron's disease and recent COVID-19 infection and dehydration, was repleted in the hospital -  will monitor  2. Crohn's disease of small intestine without complication ([place]) -  does not recall when he had his last Remicade, he will call his nurse -  continue  Budesonide, loperamide PRN  3. Essential hypertension, benign -   BP 129/83,  continue Hydralazine  4. Chronic systolic heart failure (HCC) -  stable, continue Corlanor and furosemide  5. Anemia in stage 3b chronic kidney disease (HCC) Lab Results  Component Value Date   WBC 9.3 10/15/2020   HGB 10.3 (L) 10/15/2020   HCT 32.2 (L) 10/15/2020   MCV 105.2 (H) 10/15/2020   PLT 140 (L) 10/15/2020   -Continue ferrous sulfate  6. Stage 3b chronic kidney  disease [facility]) Lab Results  Component Value Date   CREATININE 1.90 (H) 10/15/2020   -Has baseline 1.8-1.9, follows up with Dr. Graylon Gunning, nephrology  7. Bipolar 1 disorder (HCC) -   Mood is stable, continue Remeron, Depakote and Quetiapine -   Psych consult  8. Generalized weakness -   due to multiple electrolyte imbalance in the context of recent COVID-19 infection and chronic comorbidities -    admitted to [facility] for short-term  rehabilitation -    for PT and OT, for therapeutic strengthening exercises  9. Paroxysmal atrial fibrillation (HCC) -   Rate controlled, continue Eliquis, metoprolol succinate ER and isosorbide mononitrate ER  10.  Type 2 diabetes mellitus with CKDIIIb Lab Results  Component Value Date   HGBA1C 5.1 08/27/2019   -   stable, continue Farxiga and CBG checks     Family/ staff Communication: Discussed plan of care with resident and charge nurse.  Labs/tests ordered: Stool culture for C. difficile, CBC and BMP  Goals of care:   Short-term care   Durenda Age, DNP, MSN, FNP-BC [facility] [phone] (Monday-Friday 8:00 a.m. - 5:00 p.m.) [phone] (after hours)

## 2020-10-16 NOTE — Telephone Encounter (Signed)
Left message on voicemail for patient to return call when available . Reason for call was to complete TOC call and schedule a follow-up appointment in 2-3 days per instructions from the hospital provider.   We will need to make another attempt tomorrow to contact patient to complete TOC call using the .TOC2 smartphrase  I will forward to medical assistant that will be working in clinical intake tomorrow

## 2020-10-17 ENCOUNTER — Non-Acute Institutional Stay (SKILLED_NURSING_FACILITY): Payer: Medicare Other | Admitting: Internal Medicine

## 2020-10-17 ENCOUNTER — Encounter: Payer: Self-pay | Admitting: Internal Medicine

## 2020-10-17 DIAGNOSIS — N1832 Chronic kidney disease, stage 3b: Secondary | ICD-10-CM | POA: Diagnosis not present

## 2020-10-17 DIAGNOSIS — R29818 Other symptoms and signs involving the nervous system: Secondary | ICD-10-CM

## 2020-10-17 DIAGNOSIS — U071 COVID-19: Secondary | ICD-10-CM

## 2020-10-17 DIAGNOSIS — E1122 Type 2 diabetes mellitus with diabetic chronic kidney disease: Secondary | ICD-10-CM | POA: Diagnosis not present

## 2020-10-17 DIAGNOSIS — I10 Essential (primary) hypertension: Secondary | ICD-10-CM | POA: Diagnosis not present

## 2020-10-17 DIAGNOSIS — D631 Anemia in chronic kidney disease: Secondary | ICD-10-CM | POA: Diagnosis not present

## 2020-10-17 DIAGNOSIS — R4189 Other symptoms and signs involving cognitive functions and awareness: Secondary | ICD-10-CM | POA: Insufficient documentation

## 2020-10-17 NOTE — Progress Notes (Signed)
NURSING HOME LOCATION:  [facility] ROOM NUMBER:  [id number]  CODE STATUS:  Full  PCP:  Alain Honey MD  This is a comprehensive admission note to this SNFperformed on this date less than 30 days from date of admission. Included are preadmission medical/surgical history; reconciled medication list; family history; social history and comprehensive review of systems.  Corrections and additions to the records were documented. Comprehensive physical exam was also performed. Additionally a clinical summary was entered for each active diagnosis pertinent to this admission in the Problem List to enhance continuity of care.  HPI: He was hospitalized 8/15 - 10/15/2020 with hypokalemia and hypocalcemia associated with generalized weakness.  He was referred from the [facility] clinic to the ED for admission. This was in the context of COVID-19 infection approximately 10 days PTA associated with diarrhea  & anorexia 4-5 days PTA with multiple watery nonbloody diarrhea. This was in the context of a history of Crohn's disease .Electrolytes were aggressively replaced.  There was no associated hypoxia with the COVID infection. He is on Eliquis for history of chronic systolic congestive heart failure with EF of 25-30% and ICD placement. Other comorbidities include anemia of CKD; creatinine was felt to be relatively stable with a baseline of 1.8-1.9. Hemoglobin was essentially stable.  He was to continue oral iron supplement as well as monthly Epogen.  Also mild thrombocytopenia was present with platelet counts in the 140s. The generalized weakness was attributed to the multiple electrolyte imbalances noted as well as the recent COVID-19 infection.  Discharge to SNF for PT/OT was pursued.  Past medical and surgical history: Includes history of benign positional vertigo, bipolar 1 disorder, celiac disease, history of congestive heart failure, CKD stage III, history of Crohn's colitis,  history of anxiety/depression, essential tremor, essential hypertension, history of gout, history of iron deficiency anemia, history of tobacco use disorder, history of narcolepsy, and postinflammatory pulmonary fibrosis. Surgeries include cholecystectomy, ICD implant, and small bowel surgery x2.  Social history: Nondrinker; former smoker with 49-pack-year history.  Family history: Limited history reviewed.   Review of systems: Clinical neurocognitive deficits made validity of responses questionable .  He could not give me the dates of hospitalization.  When I asked him today's date he consulted his wristwatch. When I asked the reason for his admission his response was "feeling bad".  He stated that he had been found by a neighbor on the floor after lying there all night and then in the yard by another neighbor 2 to 3 days later.  He repeated this same story 10 minutes later as well.  He states he was hospitalized because his calcium and magnesium were too low.  Initially he did not know the cause of this.  When I asked about COVID he said that was the reason.  He now states "I feel great".  Review of systems was negative with special reference to infectious processes or GI symptoms.  Constitutional: No fever, significant weight change, fatigue  Eyes: No redness, discharge, pain, vision change ENT/mouth: No nasal congestion, purulent discharge, earache, change in hearing, sore throat  Cardiovascular: No chest pain, palpitations, paroxysmal nocturnal dyspnea, claudication, edema  Respiratory: No cough, sputum production, hemoptysis, DOE, significant snoring, apnea Gastrointestinal: No heartburn, dysphagia, abdominal pain, nausea /vomiting, rectal bleeding, melena, change in bowels Genitourinary: No dysuria, hematuria, pyuria, incontinence, nocturia Musculoskeletal: No joint stiffness, joint swelling, weakness, pain Dermatologic: No rash, pruritus, change in appearance of skin Neurologic: No  dizziness, headache, syncope, seizures,  numbness, tingling Psychiatric: No significant anxiety, depression, insomnia, anorexia Endocrine: No change in hair/skin/nails, excessive thirst, excessive hunger, excessive urination  Hematologic/lymphatic: No significant bruising, lymphadenopathy, abnormal bleeding Allergy/immunology: No itchy/watery eyes, significant sneezing, urticaria, angioedema  Physical exam:  Pertinent or positive findings: He appears thin and somewhat chronically ill.  Hair is disheveled and thin over the crown.  He is Geneticist, molecular.  He appears to have complete dentures but states that they "snap in and out".  He exhibited a nonproductive cough.  Breath sounds are generally decreased except for minimal rales at the bases and low-grade expiratory rhonchi in a scattered distribution.  Defibrillator is present on the left chest.  Trace edema was noted at the sock line.  The lower extremities were stronger than the upper extremities to opposition.  Clubbing of the nailbeds was present.  General appearance:  no acute distress, increased work of breathing is present.   Lymphatic: No lymphadenopathy about the head, neck, axilla. Eyes: No conjunctival inflammation or lid edema is present. There is no scleral icterus. Ears:  External ear exam shows no significant lesions or deformities.   Nose:  External nasal examination shows no deformity or inflammation. Nasal mucosa are pink and moist without lesions, exudates Oral exam: Lips and gums are healthy appearing.There is no oropharyngeal erythema or exudate. Neck:  No thyromegaly, masses, tenderness noted.    Heart:  Normal rate and regular rhythm. S1 and S2 normal without gallop, murmur, click, rub.  Lungs:  without wheezes,  rubs. Abdomen: Bowel sounds are normal.  Abdomen is soft and nontender with no organomegaly, hernias, masses. GU: Deferred  Extremities:  No cyanosis Neurologic exam:  Balance, Rhomberg, finger to nose testing could not  be completed due to clinical state Skin: Warm & dry w/o tenting. No significant lesions or rash.  See clinical summary under each active problem in the Problem List with associated updated therapeutic plan

## 2020-10-17 NOTE — Telephone Encounter (Signed)
Patient is located at [place].

## 2020-10-17 NOTE — Assessment & Plan Note (Signed)
Creatinine peaked @  2.24/ GFR nadir 30 ; CKD Stage 3b/4 Current creatinine 1.90 / GFR 36 ; CKD Stage 3b Medication List reviewed; present Allopurinol dose not nephrotoxic. If CKD progresses, it will be weaned or discontinued.

## 2020-10-17 NOTE — Assessment & Plan Note (Addendum)
The neurocognitive deficit was subtle on the interview.  He initially had trouble defining the cause of the low calcium and magnesium for which he was hospitalized.  He repeated the same story approximately 10 minutes apart about having been found down by 2 different neighbors on 2 occasions. The acute COVID infection may be playing a role here and mental status reassessment can be completed in the near future post discharge from the SNF.

## 2020-10-17 NOTE — Assessment & Plan Note (Addendum)
Current H/H 10.3/ 32.2; prior H/H 11.6/37.1 Indices macrocytic Anemia slightly worse  ROS negative for bleeding dyscrasias & none reported by Staff Monitor CBC  Continue PO iron & monthly Epogen

## 2020-10-17 NOTE — Assessment & Plan Note (Signed)
Hydralazine prn tid ordered.

## 2020-10-17 NOTE — Assessment & Plan Note (Addendum)
DM with neuro, renal & vascular complications Glucose range as IP: 82-119 Current A1c: 5.1% A1c goal : < 8% No hypoglycemia No change indicated as [name] is CHF agent as well

## 2020-10-17 NOTE — Patient Instructions (Signed)
See assessment and plan under each diagnosis in the problem list and acutely for this visit 

## 2020-10-18 NOTE — Assessment & Plan Note (Signed)
O2 sats good; pulmonary exam does not suggest significant residua of Covid infection

## 2020-10-22 ENCOUNTER — Telehealth: Payer: Self-pay

## 2020-10-22 ENCOUNTER — Telehealth (HOSPITAL_COMMUNITY): Payer: Self-pay

## 2020-10-22 NOTE — Telephone Encounter (Signed)
Called facility to cancel appointment for 8/30. Dur patient has appointment for [facility] on 9/2.

## 2020-10-22 NOTE — Progress Notes (Deleted)
Cardiology Office Note   Date:  10/22/2020   ID:  Brent, [name] [birth year], MRN [id number]  PCP:  Wardell Honour, MD  Cardiologist:  Peter Martinique, MD EP: None  No chief complaint on file.     History of Present Illness: Brent [name] is a 75 y.o. male who presents for ***    Past Medical History:  Diagnosis Date   Anemia    Anxiety    Benign paroxysmal positional vertigo    Bipolar I disorder, most recent episode (or current) unspecified    Celiac disease    CHF (congestive heart failure) (HCC)    Chronic kidney disease, stage III (moderate) (HCC)    Crohn's    Remicade q8 weeks   Depression    Essential and other specified forms of tremor    medication-induced Parkinson's, now resolved   Essential hypertension, benign    Gout 2018   Heart failure ([place])    Hypertrophy of prostate without urinary obstruction and other lower urinary tract symptoms (LUTS)    Impotence of organic origin    Insomnia with sleep apnea, unspecified    Iron deficiency anemia, unspecified    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   Neuralgia, neuritis, and radiculitis, unspecified    Other B-complex deficiencies    Other extrapyramidal disease and abnormal movement disorder    Postinflammatory pulmonary fibrosis ([place])    Tobacco use disorder    Vertigo 2018    Past Surgical History:  Procedure Laterality Date   CATARACT EXTRACTION, BILATERAL Bilateral 09/2018   CHOLECYSTECTOMY  07-12-2010   ICD IMPLANT N/A 01/02/2020   Procedure: ICD IMPLANT;  Surgeon: Evans Lance, MD;  Location: [facility];  Service: Cardiovascular;  Laterality: N/A;   RIGHT HEART CATH N/A 10/12/2018   Procedure: RIGHT HEART CATH;  Surgeon: Jolaine Artist, MD;  Location: [place] [facility];  Service: Cardiovascular;  Laterality: N/A;   RIGHT/LEFT HEART CATH AND CORONARY ANGIOGRAPHY N/A 04/26/2019   Procedure: RIGHT/LEFT HEART CATH AND CORONARY ANGIOGRAPHY;  Surgeon: Larey Dresser, MD;  Location: [facility];  Service: Cardiovascular;  Laterality: N/A;   SMALL INTESTINE SURGERY     x 2     Current Outpatient Medications  Medication Sig Dispense Refill   acetaminophen (TYLENOL) 160 MG/5ML liquid Take 10.2 mLs (325 mg total) by mouth every 6 (six) hours as needed for fever or pain.     allopurinol (ZYLOPRIM) 100 MG tablet Take 2 tablets (200 mg total) by mouth daily. 60 tablet 5   apixaban (ELIQUIS) 5 MG TABS tablet Take 1 tablet (5 mg total) by mouth 2 (two) times daily. 180 tablet 3   BUDESONIDE PO Take 9 mg by mouth daily.     Calcium 500 MG tablet Take 1 tablet (500 mg total) by mouth 1 day or 1 dose for 1 dose. 30 tablet    Cholecalciferol (VITAMIN D) 125 MCG (5000 UT) CAPS Take 5,000 Units by mouth daily.      dapagliflozin propanediol (FARXIGA) 10 MG TABS tablet Take 1 tablet (10 mg total) by mouth daily before breakfast. 90 tablet 3   divalproex (DEPAKOTE ER) 500 MG 24 hr tablet Take 1,500 mg by mouth at bedtime.      epoetin alfa (EPOGEN) 10000 UNIT/ML injection Inject 10,000 Units into the skin every 30 (thirty) days.     feeding supplement (ENSURE ENLIVE / ENSURE PLUS) LIQD Take 237 mLs by mouth 2 (two) times daily between  meals.     ferrous sulfate 325 (65 FE) MG tablet Take 325 mg by mouth in the morning and at bedtime.     furosemide (LASIX) 20 MG tablet Take 1 tablet (20 mg total) by mouth every Monday, Wednesday, and Friday. 39 tablet 3   hydrALAZINE (APRESOLINE) 25 MG tablet Take 25 mg by mouth 3 (three) times daily as needed (High BP, SBP>130).     inFLIXimab (REMICADE) 100 MG injection Infuse Remicade IV schedule 1 49m/kg every 8 weeks Premedicate with Tylenol 500-6571mby mouth and Benadryl 25-5054my mouth prior to infusion. Last PPD was on 12/2009.  1 each 6   isosorbide mononitrate (IMDUR) 60 MG 24 hr tablet Take 1 tablet (60 mg total) by mouth daily. 90 tablet 3   ivabradine (CORLANOR) 5 MG TABS tablet TAKE 1 TABLET(5 MG) BY MOUTH TWICE DAILY WITH A MEAL (Patient  taking differently: Take 5 mg by mouth 2 (two) times daily with a meal.) 180 tablet 3   loperamide (IMODIUM) 2 MG capsule Take 2 mg by mouth 3 (three) times daily as needed for diarrhea or loose stools.     Magnesium 400 MG TABS Take 400 mg by mouth daily. 30 tablet 1   metoprolol succinate (TOPROL-XL) 25 MG 24 hr tablet Take 1 tablet (25 mg total) by mouth in the morning and at bedtime. 180 tablet 6   mirtazapine (REMERON) 7.5 MG tablet TAKE 1 TABLET(7.5 MG) BY MOUTH AT BEDTIME (Patient taking differently: Take 7.5 mg by mouth at bedtime. TAKE 1 TABLET(7.5 MG) BY MOUTH AT BEDTIME) 30 tablet 11   Multiple Vitamin (MULTIVITAMIN WITH MINERALS) TABS tablet Take 1 tablet by mouth daily.     NON FORMULARY Mucositis mouth wash- uses as needed     omeprazole (PRILOSEC) 20 MG capsule Take 20 mg by mouth daily.     QUEtiapine (SEROQUEL) 100 MG tablet Take 150 mg by mouth at bedtime.     solifenacin (VESICARE) 5 MG tablet Take 5 mg by mouth daily.     tamsulosin (FLOMAX) 0.4 MG CAPS capsule Take 1 capsule (0.4 mg total) by mouth daily. 30 capsule 2   vitamin B-12 (CYANOCOBALAMIN) 500 MCG tablet Take 1,000 mcg by mouth daily.      No current facility-administered medications for this visit.    Allergies:   Azathioprine, Ciprofloxacin, Levaquin [levofloxacin in d5w], and Plendil [felodipine]    Social History:  The patient  reports that he quit smoking about 6 years ago. His smoking use included cigarettes. He started smoking about 64 years ago. He has a 49.00 pack-year smoking history. He has never used smokeless tobacco. He reports that he does not drink alcohol and does not use drugs.   Family History:  The patient's ***family history includes Diabetes in his mother; Emphysema in his father; Pneumonia in his maternal grandmother; Uterine cancer in his mother.    ROS:  Please see the history of present illness.   Otherwise, review of systems are positive for {NONE DEFAULTED:18576}.   All other systems  are reviewed and negative.    PHYSICAL EXAM: VS:  There were no vitals taken for this visit. , BMI There is no height or weight on file to calculate BMI. GEN: Well nourished, well developed, in no acute distress HEENT: normal Neck: no JVD, carotid bruits, or masses Cardiac: ***RRR; no murmurs, rubs, or gallops,no edema  Respiratory:  clear to auscultation bilaterally, normal work of breathing GI: soft, nontender, nondistended, + BS MS: no deformity  or atrophy Skin: warm and dry, no rash Neuro:  Strength and sensation are intact Psych: euthymic mood, full affect   EKG:  EKG {ACTION; IS/IS GXQ:11941740} ordered today. The ekg ordered today demonstrates ***   Recent Labs: 10/10/2020: ALT 9 10/11/2020: Magnesium 2.0 10/15/2020: BUN 25; Creatinine, Ser 1.90; Hemoglobin 10.3; Platelets 140; Potassium 3.5; Sodium 143    Lipid Panel    Component Value Date/Time   CHOL 161 10/20/2019 1158   CHOL 137 08/10/2015 1322   TRIG 112 10/20/2019 1158   HDL 54 10/20/2019 1158   HDL 52 08/10/2015 1322   CHOLHDL 3.0 10/20/2019 1158   VLDL 22 04/19/2019 1041   LDLCALC 86 10/20/2019 1158      Wt Readings from Last 3 Encounters:  10/16/20 161 lb (73 kg)  10/15/20 160 lb 15 oz (73 kg)  10/03/20 156 lb 6.4 oz (70.9 kg)      Other studies Reviewed: Additional studies/ records that were reviewed today include: ***.    ASSESSMENT AND PLAN:  1.  ***   Current medicines are reviewed at length with the patient today.  The patient {ACTIONS; HAS/DOES NOT HAVE:19233} concerns regarding medicines.  The following changes have been made:  {PLAN; NO CHANGE:13088:s}  Labs/ tests ordered today include: *** No orders of the defined types were placed in this encounter.    Disposition:   FU with *** in {gen number 8-14:[id number]} {Days to years:10300}  Signed, Abigail Butts, PA-C  10/22/2020 6:14 AM

## 2020-10-22 NOTE — Telephone Encounter (Signed)
Spoke with [facility]. To advise patient has appointment on 9/2 with [facility] and it is advised for patient go to 9/2 appointment and cancel appointment for 8/30.

## 2020-10-22 NOTE — Telephone Encounter (Signed)
Spoke to [name] who reports he is was discharged to [facility] for the foreseeable future but understands to call me once discharged home. Call complete.

## 2020-10-23 ENCOUNTER — Ambulatory Visit: Payer: No Typology Code available for payment source | Admitting: Medical

## 2020-10-25 NOTE — Progress Notes (Signed)
Remote ICD transmission.   

## 2020-10-25 NOTE — Progress Notes (Signed)
PCP: Wardell Honour, MD Cardiology: Dr. Leandro Reasoner Brent [name] is a 75 y.o. with a history of Crohn's disease, celiac disease, HTN, COPD, prior tobacco use, CKD 3, chronic anemia, and bipolar disorder. Diagnosed with systolic HF in 6/71 (echo with EF 30-35%).     Admitted 3/11-3/16/20 with SOB and hypoxia. Found to have PNA and required BIPAP and antibiotics. Blood cultures were negative. Echo showed newly reduced EF 30-35%. Cardiology consulted and he had nuclear stress test, which was read as "high risk" due to low EF. Per Dr Sallyanne Kuster, did not think changes were indicative of coronary disease and cath was not pursued due this and due to CKD. HF meds optimized. Limited with CKD3. He had one short run of NSVT. Also had AKI on CKD3, but creatinine improved to baseline 1.77 on day of discharge.   He was seen in ED 05/12/18 with SOB after misplacing his nebulizer. BNP was elevated, so he was given 40 mg IV lasix and discharged with lasix 20 mg BID. Referred to HF clinic.   He was admitted again in 7/20 with weakness, orthostatic hypotension, and AKI.  Cardiac meds were held.  He was noted to be severely deconditioned, and he was discharged to SNF.  He remained in SNF x 2 wks and is now home.   Echo was done in 8/20, EF 20-25% with mild LVH and normal RV.  He had RHC in 8/20 showing normal filling pressures and good cardiac output (surprisingly).   LHC/RHC in 3/21 showed no significant CAD, low filling pressures, preserved cardiac output. Echo in 9/21 with EF 25-30%.  Medtronic ICD placed in 11/21.   He returned 1/22 for followup of CHF.  Doing well, weight is down 5 lbs.  Stable NYHA II symptoms. No diarrhea, Crohns seems stable. Main complaint was knee and shoulder arthritis.   He was admitted 3/22 for hypokalemia after having labs drawn at [place]. K was 2.7 on admit. He was treated with calcium gluconate, IV mag sulfate and potassium.   He was evaluated in the ED 6/22 for chest soreness. Cardiac work  up, including device interrogation, was reassuring.   [facility] follow up 7/22 doing well, NYHA II symptoms, volume stable. BP remained elevated; hydralazine now PRN for SBP >130 per Nephrology.  Admitted 8/15-8/22/22 with hypokalemia and hypocalcemia. He was COVID+ 10 days prior and had diarrhea and anorexia. His electrolytes were replaced and he was discharged on previous medications to SNF for PT/OT.  Today he returns for HF follow up. Currently at [facility] for PT/OT, he says he will be discharged in a couple days. Able to participate in PT without significant dyspnea, still some weakness. Still has cough post-COVID. He tells me he still has chest soreness for the past year. Denies dizziness, edema, or PND/Orthopnea. Appetite ok, eating regular diet at SNF. No fever or chills. Taking all medications.   Medtronic device interrogation: OptiVol up, thoracic impedance down, 1-2 hrs/day activity, no VT/VF, no AF (Personally reviewed).  ECG: SR with PVCs (personally reviewed).  Labs (7/20): K 5.1, creatinine 2.25 Labs (8/20): K 4.8, creatinine 2.65 Labs (9/20): hgb 9.7 Labs (12/20): K 4.1, creatinine 1.76 Labs (3/21): K 5, creatinine 2.25 Labs (6/21): hgb 12 Labs (11/21): K 4.3, creatinine 2.62, hgb 12.2 Labs (6/22): K 3.9, creatinine 1.93, hgb 11.4 Labs (8/22): K 3.5, creatinine 1.9  PMH: 1. Crohns disease: Gets Remicade.  2. Celiac disease.  3. COPD: Prior smoker.  - CT chest (7/20) with mild emphysema.  -  PFTs (9/20): Mild obstruction, mild restriction.  - High resolution CT chest (10/20): No ILD, +chronic bronchitis.  4. CKD: Stage 3.  5. HTN 6. Bipolar disorder 7. H/o BPPV 8. Gout 9. Chronic systolic CHF: Nonischemic cardiomyopathy.  Medtronic ICD.  - Echo (3/20): EF 30-35%, mild LVH, normal RV.  - Cardiolite (3/20): EF 28%, no ischemia, fixed defects in anteroseptal and inferolateral walls.  - Echo (8/20): EF 20-25%, diffuse hypokinesis, mild LVH, normal RV size and systolic  function, normal IVC size.  - RHC (8/20): mean RA 3, PA 21/10, mean PCWP 5, CI 3.2 Fick and 3.5 Thermo - RHC/LHC (3/21): No significant CAD; mean RA 1, PA 20/6, mean PCWP 6, CI 4.57  Social History   Socioeconomic History   Marital status: Divorced    Spouse name: Not on file   Number of children: 1   Years of education: 12   Highest education level: Not on file  Occupational History   Occupation: retired   Occupation: Veteran  Tobacco Use   Smoking status: Former    Packs/day: 1.00    Years: 49.00    Pack years: 49.00    Types: Cigarettes    Start date: 04/11/1956    Quit date: 04/17/2014    Years since quitting: 6.5   Smokeless tobacco: Never  Vaping Use   Vaping Use: Never used  Substance and Sexual Activity   Alcohol use: No    Alcohol/week: 0.0 standard drinks   Drug use: No   Sexual activity: Never  Other Topics Concern   Not on file  Social History Narrative   Not on file   Social Determinants of Health   Financial Resource Strain: Not on file  Food Insecurity: Not on file  Transportation Needs: Not on file  Physical Activity: Not on file  Stress: Not on file  Social Connections: Not on file  Intimate Partner Violence: Not on file   Family History  Problem Relation Age of Onset   Diabetes Mother        maternal grandmother   Uterine cancer Mother    Emphysema Father    Pneumonia Maternal Grandmother    Colon cancer Neg Hx    ROS: All systems reviewed and negative except as per HPI.   Current Outpatient Medications  Medication Sig Dispense Refill   acetaminophen (TYLENOL) 160 MG/5ML liquid Take 10.2 mLs (325 mg total) by mouth every 6 (six) hours as needed for fever or pain.     allopurinol (ZYLOPRIM) 100 MG tablet Take 2 tablets (200 mg total) by mouth daily. 60 tablet 5   apixaban (ELIQUIS) 5 MG TABS tablet Take 1 tablet (5 mg total) by mouth 2 (two) times daily. 180 tablet 3   BUDESONIDE PO Take 9 mg by mouth daily.     Calcium 500 MG tablet Take  1 tablet (500 mg total) by mouth 1 day or 1 dose for 1 dose. 30 tablet    Cholecalciferol (VITAMIN D) 125 MCG (5000 UT) CAPS Take 5,000 Units by mouth daily.      dapagliflozin propanediol (FARXIGA) 10 MG TABS tablet Take 1 tablet (10 mg total) by mouth daily before breakfast. 90 tablet 3   divalproex (DEPAKOTE ER) 500 MG 24 hr tablet Take 1,500 mg by mouth at bedtime.      epoetin alfa (EPOGEN) 10000 UNIT/ML injection Inject 10,000 Units into the skin every 30 (thirty) days.     feeding supplement (ENSURE ENLIVE / ENSURE PLUS) LIQD Take 237 mLs  by mouth 2 (two) times daily between meals.     ferrous sulfate 325 (65 FE) MG tablet Take 325 mg by mouth in the morning and at bedtime.     furosemide (LASIX) 20 MG tablet Take 1 tablet (20 mg total) by mouth every Monday, Wednesday, and Friday. 39 tablet 3   hydrALAZINE (APRESOLINE) 25 MG tablet Take 25 mg by mouth 3 (three) times daily as needed (High BP, SBP>130).     inFLIXimab (REMICADE) 100 MG injection Infuse Remicade IV schedule 1 25m/kg every 8 weeks Premedicate with Tylenol 500-653mby mouth and Benadryl 25-5048my mouth prior to infusion. Last PPD was on 12/2009.  1 each 6   isosorbide mononitrate (IMDUR) 60 MG 24 hr tablet Take 1 tablet (60 mg total) by mouth daily. 90 tablet 3   ivabradine (CORLANOR) 5 MG TABS tablet TAKE 1 TABLET(5 MG) BY MOUTH TWICE DAILY WITH A MEAL (Patient taking differently: Take 5 mg by mouth 2 (two) times daily with a meal.) 180 tablet 3   loperamide (IMODIUM) 2 MG capsule Take 2 mg by mouth 3 (three) times daily as needed for diarrhea or loose stools.     Magnesium 400 MG TABS Take 400 mg by mouth daily. 30 tablet 1   metoprolol succinate (TOPROL-XL) 25 MG 24 hr tablet Take 1 tablet (25 mg total) by mouth in the morning and at bedtime. 180 tablet 6   mirtazapine (REMERON) 7.5 MG tablet TAKE 1 TABLET(7.5 MG) BY MOUTH AT BEDTIME (Patient taking differently: Take 7.5 mg by mouth at bedtime. TAKE 1 TABLET(7.5 MG) BY  MOUTH AT BEDTIME) 30 tablet 11   Multiple Vitamin (MULTIVITAMIN WITH MINERALS) TABS tablet Take 1 tablet by mouth daily.     omeprazole (PRILOSEC) 20 MG capsule Take 20 mg by mouth daily.     QUEtiapine (SEROQUEL) 100 MG tablet Take 150 mg by mouth at bedtime.     solifenacin (VESICARE) 5 MG tablet Take 5 mg by mouth daily.     tamsulosin (FLOMAX) 0.4 MG CAPS capsule Take 1 capsule (0.4 mg total) by mouth daily. 30 capsule 2   vitamin B-12 (CYANOCOBALAMIN) 500 MCG tablet Take 1,000 mcg by mouth daily.      NON FORMULARY Mucositis mouth wash- uses as needed     No current facility-administered medications for this encounter.   BP (!) 144/70   Pulse 90   Wt 76.4 kg (168 lb 6.4 oz)   SpO2 95%   BMI 20.50 kg/m  Wt Readings from Last 3 Encounters:  10/26/20 76.4 kg (168 lb 6.4 oz)  10/16/20 73 kg (161 lb)  10/15/20 73 kg (160 lb 15 oz)   Physical Exam: General:  NAD. No resp difficulty, elderly, walked into clinic with walker. HEENT: Normal Neck: Supple. JVP 6-7. Carotids 2+ bilat; no bruits. No lymphadenopathy or thryomegaly appreciated. Cor: PMI nondisplaced. Regular rate & rhythm. No rubs, gallops or murmurs. Lungs: Clear Abdomen: Soft, nontender, nondistended. No hepatosplenomegaly. No bruits or masses. Good bowel sounds. Extremities: No cyanosis, clubbing, rash, trace ankle edema Neuro: Alert & oriented x 3, cranial nerves grossly intact. Moves all 4 extremities w/o difficulty. Affect pleasant.  Assessment/Plan: 1. Chronic systolic CHF: Nonischemic cardiomyoapthy.  Diagnosed by echo in 3/20, EF 30-35%.  LHC in 3/21 with no significant CAD. He has no chest pain.  Medication titration has been limited by orthostasis/low BP.  Echo in 8/20 showed EF 20-25% with mild LVH and normal RV function.  With resting sinus tachycardia, I  was concerned for low output HF, but RHC in 8/20 and again in 3/21 showed normal cardiac output and filling pressures. Echo in 9/21 with EF 25-30%, Medtronic ICD  placed.  Today, NYHA class II, although he is not very physically active. He is mildly volume overloaded on exam and by OptiVol, likely due to increased sodium intake at facility. GDMT has been limited by low BP and orthostasis in the past, and by CKD.  - Increase Lasix to 20 mg daily (previously on MWF). BMET today, repeat in 10 days. - Will ask device RN to send transmission in 1 week to ensure fluid levels return to baseline. - Continue Farxiga 10 mg daily. - Continue Imdur 60 mg daily. - Continue Toprol XL 25 mg bid.  - Continue ivabradine 5 mg bid.   - If creatinine trending down, add spironolactone next.  - No ACEI/ARB/ARNI with elevated creatinine.  - He would be a difficult candidate for advanced therapies with deconditioning, renal dysfunction, and lives alone.  2. Atrial fibrillation, paroxysmal: Occurred in 2/22. NSR today on ECG. On Eliquis. No bleeding. - Followed by Dr. Lovena Le. 3. CKD: Stage 3. Followed by Dr. Posey Pronto. - BMET today. 4. COPD: Mild emphysema on prior chest CT.  PFTs in 9/20 showed mild obstruction and mild restriction.  High resolution CT chest 10/20 showed no ILD, +chronic bronchitis.  - Repeat CT chests to follow small lung nodule given smoking history.  5. HTN: Elevated today. He has struggled with orthostasis and low BP in the past. Hydralazine is now PRN for SBP>130 per nephrology, however he has difficulty remembering to check BP at home. BP with paramedicine consistently >130s.  - Will log BP at home after taking daily medications and review at next appt. - Consider adding spiro.  Follow up in 2 months with Dr. Newman Nip, FNP-BC 10/26/2020

## 2020-10-26 ENCOUNTER — Encounter (HOSPITAL_COMMUNITY): Payer: Self-pay

## 2020-10-26 ENCOUNTER — Ambulatory Visit (HOSPITAL_COMMUNITY)
Admission: RE | Admit: 2020-10-26 | Discharge: 2020-10-26 | Disposition: A | Discharge status: Home / Self Care | Payer: Medicare Other | Source: Ambulatory Visit | Attending: Family Medicine | Admitting: Family Medicine

## 2020-10-26 ENCOUNTER — Encounter: Payer: Self-pay | Admitting: Adult Health

## 2020-10-26 ENCOUNTER — Non-Acute Institutional Stay (SKILLED_NURSING_FACILITY): Payer: Medicare Other | Admitting: Adult Health

## 2020-10-26 ENCOUNTER — Other Ambulatory Visit: Payer: Self-pay

## 2020-10-26 VITALS — BP 144/70 | HR 90 | Wt 168.4 lb

## 2020-10-26 DIAGNOSIS — E1122 Type 2 diabetes mellitus with diabetic chronic kidney disease: Secondary | ICD-10-CM

## 2020-10-26 DIAGNOSIS — Z7901 Long term (current) use of anticoagulants: Secondary | ICD-10-CM | POA: Insufficient documentation

## 2020-10-26 DIAGNOSIS — Z7984 Long term (current) use of oral hypoglycemic drugs: Secondary | ICD-10-CM | POA: Insufficient documentation

## 2020-10-26 DIAGNOSIS — K900 Celiac disease: Secondary | ICD-10-CM | POA: Insufficient documentation

## 2020-10-26 DIAGNOSIS — I480 Paroxysmal atrial fibrillation: Secondary | ICD-10-CM | POA: Diagnosis not present

## 2020-10-26 DIAGNOSIS — I10 Essential (primary) hypertension: Secondary | ICD-10-CM

## 2020-10-26 DIAGNOSIS — F319 Bipolar disorder, unspecified: Secondary | ICD-10-CM | POA: Diagnosis not present

## 2020-10-26 DIAGNOSIS — F331 Major depressive disorder, recurrent, moderate: Secondary | ICD-10-CM

## 2020-10-26 DIAGNOSIS — I428 Other cardiomyopathies: Secondary | ICD-10-CM | POA: Insufficient documentation

## 2020-10-26 DIAGNOSIS — Z79899 Other long term (current) drug therapy: Secondary | ICD-10-CM | POA: Diagnosis not present

## 2020-10-26 DIAGNOSIS — Z87891 Personal history of nicotine dependence: Secondary | ICD-10-CM | POA: Insufficient documentation

## 2020-10-26 DIAGNOSIS — J449 Chronic obstructive pulmonary disease, unspecified: Secondary | ICD-10-CM

## 2020-10-26 DIAGNOSIS — N401 Enlarged prostate with lower urinary tract symptoms: Secondary | ICD-10-CM

## 2020-10-26 DIAGNOSIS — N1832 Chronic kidney disease, stage 3b: Secondary | ICD-10-CM | POA: Diagnosis not present

## 2020-10-26 DIAGNOSIS — I5022 Chronic systolic (congestive) heart failure: Secondary | ICD-10-CM | POA: Diagnosis not present

## 2020-10-26 DIAGNOSIS — Z8616 Personal history of COVID-19: Secondary | ICD-10-CM | POA: Insufficient documentation

## 2020-10-26 DIAGNOSIS — Z95810 Presence of automatic (implantable) cardiac defibrillator: Secondary | ICD-10-CM | POA: Diagnosis not present

## 2020-10-26 DIAGNOSIS — K5090 Crohn's disease, unspecified, without complications: Secondary | ICD-10-CM | POA: Insufficient documentation

## 2020-10-26 DIAGNOSIS — U071 COVID-19: Secondary | ICD-10-CM

## 2020-10-26 DIAGNOSIS — N183 Chronic kidney disease, stage 3 unspecified: Secondary | ICD-10-CM | POA: Diagnosis not present

## 2020-10-26 DIAGNOSIS — I130 Hypertensive heart and chronic kidney disease with heart failure and stage 1 through stage 4 chronic kidney disease, or unspecified chronic kidney disease: Secondary | ICD-10-CM | POA: Diagnosis not present

## 2020-10-26 DIAGNOSIS — D631 Anemia in chronic kidney disease: Secondary | ICD-10-CM

## 2020-10-26 DIAGNOSIS — M1A9XX0 Chronic gout, unspecified, without tophus (tophi): Secondary | ICD-10-CM | POA: Diagnosis not present

## 2020-10-26 DIAGNOSIS — R630 Anorexia: Secondary | ICD-10-CM

## 2020-10-26 DIAGNOSIS — R350 Frequency of micturition: Secondary | ICD-10-CM

## 2020-10-26 LAB — BASIC METABOLIC PANEL
Anion gap: 8 (ref 5–15)
BUN: 33 mg/dL — ABNORMAL HIGH (ref 8–23)
CO2: 25 mmol/L (ref 22–32)
Calcium: 8.5 mg/dL — ABNORMAL LOW (ref 8.9–10.3)
Chloride: 106 mmol/L (ref 98–111)
Creatinine, Ser: 2.07 mg/dL — ABNORMAL HIGH (ref 0.61–1.24)
GFR, Estimated: 33 mL/min — ABNORMAL LOW (ref >60)
Glucose, Bld: 88 mg/dL (ref 70–99)
Potassium: 3.8 mmol/L (ref 3.5–5.1)
Sodium: 139 mmol/L (ref 135–145)

## 2020-10-26 MED ORDER — FUROSEMIDE 20 MG PO TABS: 20.0000 mg | ORAL_TABLET | Freq: Every day | ORAL | 3 refills | Status: DC

## 2020-10-26 NOTE — Progress Notes (Addendum)
Location:  [facility] Room Number: 211 A Place of Service:  SNF (31) Provider:  Durenda Age, DNP, FNP-BC  Patient Care Team: Wardell Honour, MD as PCP - General (Family Medicine) Martinique, Peter M, MD as PCP - Cardiology (Cardiology) Larey Dresser, MD as PCP - Advanced Heart Failure (Cardiology) Elmarie Shiley, MD (Nephrology) Milus Banister, MD (Gastroenterology) Jorge Ny, LCSW as Social Worker (Licensed Clinical Social Worker)  Extended Emergency Contact Information Primary Emergency Contact: [place] Phone: [phone] Mobile Phone: [phone] Relation: Sister Secondary Emergency Contact: [facility] Mobile Phone: [phone] Relation: Friend  Code Status:  FULL CODE  Goals of care: Advanced Directive information Advanced Directives 10/26/2020  Does Patient Have a Medical Advance Directive? No  Type of Advance Directive -  Does patient want to make changes to medical advance directive? -  Copy of [place] in Chart? -  Would patient like information on creating a medical advance directive? -  Pre-existing out of facility DNR order (yellow form or pink MOST form) -     Chief Complaint  Patient presents with   Discharge Note    Discharge    HPI:  Pt is a 75 y.o. male who is for discharge home on 10/29/20 with Home health PT, OT and Aide.  He was admitted to [facility] on 10/15/2020 post hospital admission 10/08/2020 to 10/15/2020.  He has a PMH of chronic systolic CHF, hypertension, CKD stage IIIb, Crohn's disease, on chronic anticoagulation/Eliquis and ICD in place.  He was seen at the [facility] clinic for a follow-up, was noted to have low potassium and generalized weakness so he was referred to ED.  He had COVID-19 infection 10 days prior to ED referral.  He developed diarrhea 4 to 5 days prior to hospitalization with multiple nonbloody diarrhea.  Labs showed K2.7 and Mg 1.3.  He  was not able to ambulate on his own.  Chest x-ray was negative for infiltrate or consolidations.  WBC at 12,000, hemoglobin 11.6, hematocrit 7.1, sodium 142, creatinine 2.24, BUN 29, alk phos 67, AST 13, ALT 10.  Electrolyte imbalance were felt to be due to to recent diarrhea due to his, poor oral intake in the context of recent COVID-19 infection.  He was given IV magnesium and potassium.  Patient was admitted to this facility for short-term rehabilitation after the patient's recent hospitalization.  Patient has completed SNF rehabilitation and therapy has cleared the patient for discharge.   Past Medical History:  Diagnosis Date   Anemia    Anxiety    Benign paroxysmal positional vertigo    Bipolar I disorder, most recent episode (or current) unspecified    Celiac disease    CHF (congestive heart failure) (HCC)    Chronic kidney disease, stage III (moderate) (HCC)    Crohn's    Remicade q8 weeks   Depression    Essential and other specified forms of tremor    medication-induced Parkinson's, now resolved   Essential hypertension, benign    Gout 2018   Heart failure ([place])    Hypertrophy of prostate without urinary obstruction and other lower urinary tract symptoms (LUTS)    Impotence of organic origin    Insomnia with sleep apnea, unspecified    Iron deficiency anemia, unspecified    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   Neuralgia, neuritis, and radiculitis, unspecified    Other B-complex deficiencies    Other extrapyramidal disease and abnormal movement disorder    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[place])    Tobacco use disorder    Vertigo 2018   Past Surgical History:  Procedure Laterality Date   CATARACT EXTRACTION, BILATERAL Bilateral 09/2018   CHOLECYSTECTOMY  07-12-2010   ICD IMPLANT N/A 01/02/2020   Procedure: ICD IMPLANT;  Surgeon: Evans Lance, MD;  Location: [facility] CV LAB;  Service: Cardiovascular;  Laterality: N/A;   RIGHT HEART CATH N/A 10/12/2018   Procedure: RIGHT HEART  CATH;  Surgeon: Jolaine Artist, MD;  Location: [facility];  Service: Cardiovascular;  Laterality: N/A;   RIGHT/LEFT HEART CATH AND CORONARY ANGIOGRAPHY N/A 04/26/2019   Procedure: RIGHT/LEFT HEART CATH AND CORONARY ANGIOGRAPHY;  Surgeon: Larey Dresser, MD;  Location: [facility];  Service: Cardiovascular;  Laterality: N/A;   SMALL INTESTINE SURGERY     x 2    Allergies  Allergen Reactions   Azathioprine Other (See Comments)    REACTION: affected WBC "Almost died"   Ciprofloxacin Other (See Comments)    Reaction not recalled   Levaquin [Levofloxacin In D5w] Other (See Comments)    Reaction not rec   Plendil [Felodipine] Other (See Comments)    Reaction not not recalled    Outpatient Encounter Medications as of 10/26/2020  Medication Sig   acetaminophen (TYLENOL) 160 MG/5ML liquid Take 10.2 mLs (325 mg total) by mouth every 6 (six) hours as needed for fever or pain.   allopurinol (ZYLOPRIM) 100 MG tablet Take 2 tablets (200 mg total) by mouth daily.   apixaban (ELIQUIS) 5 MG TABS tablet Take 1 tablet (5 mg total) by mouth 2 (two) times daily.   BUDESONIDE PO Take 9 mg by mouth daily.   Cholecalciferol (VITAMIN D) 125 MCG (5000 UT) CAPS Take 5,000 Units by mouth daily.    dapagliflozin propanediol (FARXIGA) 10 MG TABS tablet Take 1 tablet (10 mg total) by mouth daily before breakfast.   divalproex (DEPAKOTE ER) 500 MG 24 hr tablet Take 1,500 mg by mouth at bedtime.    feeding supplement (ENSURE ENLIVE / ENSURE PLUS) LIQD Take 237 mLs by mouth 2 (two) times daily between meals.   ferrous sulfate 325 (65 FE) MG tablet Take 325 mg by mouth in the morning and at bedtime.   furosemide (LASIX) 20 MG tablet Take 1 tablet (20 mg total) by mouth daily.   hydrALAZINE (APRESOLINE) 25 MG tablet Take 25 mg by mouth 3 (three) times daily as needed (High BP, SBP>130).   inFLIXimab (REMICADE) 100 MG injection Infuse Remicade IV schedule 1 35m/kg every 8 weeks Premedicate with Tylenol  500-6530mby mouth and Benadryl 25-5023my mouth prior to infusion. Last PPD was on 12/2009.    isosorbide mononitrate (IMDUR) 60 MG 24 hr tablet Take 1 tablet (60 mg total) by mouth daily.   ivabradine (CORLANOR) 5 MG TABS tablet TAKE 1 TABLET(5 MG) BY MOUTH TWICE DAILY WITH A MEAL (Patient taking differently: Take 5 mg by mouth 2 (two) times daily with a meal.)   loperamide (IMODIUM) 2 MG capsule Take 2 mg by mouth 3 (three) times daily as needed for diarrhea or loose stools.   Magnesium 400 MG TABS Take 400 mg by mouth daily.   metoprolol succinate (TOPROL-XL) 25 MG 24 hr tablet Take 1 tablet (25 mg total) by mouth in the morning and at bedtime.   mirtazapine (REMERON) 7.5 MG tablet TAKE 1 TABLET(7.5 MG) BY MOUTH AT BEDTIME (Patient taking differently: Take 7.5 mg by mouth at bedtime. TAKE 1 TABLET(7.5 MG) BY MOUTH  AT BEDTIME)   Multiple Vitamin (MULTIVITAMIN WITH MINERALS) TABS tablet Take 1 tablet by mouth daily.   Nutritional Supplements (NUTRITIONAL SUPPLEMENT PO) Take by mouth daily. Magic Cup   omeprazole (PRILOSEC) 20 MG capsule Take 20 mg by mouth daily.   polyethylene glycol (MIRALAX / GLYCOLAX) 17 g packet Take 17 g by mouth daily. Give Miralax with 6 oz of water by mouth as needed for constipation   QUEtiapine (SEROQUEL) 100 MG tablet Take 150 mg by mouth at bedtime.   solifenacin (VESICARE) 5 MG tablet Take 5 mg by mouth daily.   tamsulosin (FLOMAX) 0.4 MG CAPS capsule Take 1 capsule (0.4 mg total) by mouth daily.   vitamin B-12 (CYANOCOBALAMIN) 500 MCG tablet Take 1,000 mcg by mouth daily.    Calcium 500 MG tablet Take 1 tablet (500 mg total) by mouth 1 day or 1 dose for 1 dose.   [DISCONTINUED] epoetin alfa (EPOGEN) 10000 UNIT/ML injection Inject 10,000 Units into the skin every 30 (thirty) days.   [DISCONTINUED] NON FORMULARY Mucositis mouth wash- uses as needed   No facility-administered encounter medications on file as of 10/26/2020.    Review of Systems  GENERAL: No  change in appetite, no fatigue, no weight changes, no fever, chills or weakness MOUTH and THROAT: Denies oral discomfort, gingival pain or bleeding RESPIRATORY: no cough, SOB, DOE, wheezing, hemoptysis CARDIAC: No chest pain, edema or palpitations GI: No abdominal pain, diarrhea, constipation, heart burn, nausea or vomiting GU: Denies dysuria, frequency, hematuria, incontinence, or discharge NEUROLOGICAL: Denies dizziness, syncope, numbness, or headache PSYCHIATRIC: Denies feelings of depression or anxiety. No report of hallucinations, insomnia, paranoia, or agitation   Immunization History  Administered Date(s) Administered   Fluad Quad(high Dose 65+) 11/15/2018, 11/17/2019   H1N1 02/07/2008   Influenza, High Dose Seasonal PF 11/26/2016, 12/15/2017   Influenza, Seasonal, Injecte, Preservative Fre 12/12/2008, 11/07/2009, 11/04/2010   Influenza,inj,Quad PF,6+ Mos 11/13/2014, 01/03/2016   Influenza-Unspecified 01/11/1997, 12/15/1997, 03/02/2000, 01/02/2003, 12/18/2003, 12/30/2004, 12/10/2005, 12/25/2005, 01/11/2007, 02/07/2008, 12/08/2011, 10/25/2012, 12/16/2012, 12/09/2013, 11/28/2014, 12/31/2015, 11/24/2016, 10/29/2017, 11/24/2017, 12/15/2017, 11/25/2018   Moderna Sars-Covid-2 Vaccination 04/20/2019, 05/18/2019, 10/25/2019   PPD Test 12/30/2010, 01/05/2012, 01/07/2013, 01/13/2014   Pneumococcal Conjugate-13 01/03/2014, 08/10/2014   Pneumococcal Polysaccharide-23 03/02/2000, 03/05/2004, 03/27/2005, 01/09/2015, 01/03/2016, 05/06/2018   Pneumococcal-Unspecified 12/25/2005   Tdap 05/12/2011, 05/26/2011   Zoster Recombinat (Shingrix) 05/13/2018, 04/01/2019   Pertinent  Health Maintenance Due  Topic Date Due   HEMOGLOBIN A1C  02/27/2020   FOOT EXAM  08/09/2020   INFLUENZA VACCINE  09/24/2020   OPHTHALMOLOGY EXAM  06/28/2021   COLONOSCOPY (Pts 45-64yr Insurance coverage will need to be confirmed)  09/17/2027   PNA vac Low Risk Adult  Completed   Fall Risk  07/26/2020 04/26/2020 02/02/2020  01/24/2020 12/16/2019  Falls in the past year? 0 0 0 0 -  Number falls in past yr: 0 0 0 0 1  Injury with Fall? 0 0 0 0 0  Risk for fall due to : - - - - -  Risk for fall due to: Comment - - - - -  Follow up - - - - -     Vitals:   10/26/20 1554  BP: 137/74  Pulse: 76  Resp: 19  Temp: 97.7 F (36.5 C)  Weight: 168 lb (76.2 kg)  Height: 6' 4"  (1.93 m)   Body mass index is 20.45 kg/m.  Physical Exam  GENERAL APPEARANCE: Well nourished. In no acute distress. Normal body habitus SKIN:  Skin is warm and dry.  MOUTH  and THROAT: Lips are without lesions. Oral mucosa is moist and without lesions.  RESPIRATORY: Breathing is even & unlabored, BS CTAB CARDIAC: RRR, no murmur,no extra heart sounds, no edema GI: Abdomen soft, normal BS, no masses, no tenderness NEUROLOGICAL: There is no tremor. Speech is clear. Alert and oriented X 3. PSYCHIATRIC:  Affect and behavior are appropriate  Labs reviewed: Recent Labs    10/09/20 0435 10/10/20 0154 10/11/20 0342 10/12/20 0650 10/15/20 0723 10/26/20 1232  NA 141 142 144 143 143 139  K 3.3* 3.9 3.9 3.8 3.5 3.8  CL 111 114* 109 113* 109 106  CO2 23 22 25 23 29 25   GLUCOSE 94 119* 101* 78 82 88  BUN 27* 22 19 20  25* 33*  CREATININE 2.06* 1.81* 1.92* 1.79* 1.90* 2.07*  CALCIUM 8.5* 8.5* 8.9 8.3* 8.4* 8.5*  MG 1.7 1.4* 2.0  --   --   --   PHOS  --  1.6* 3.8  --   --   --    Recent Labs    08/14/20 1017 10/08/20 1855 10/09/20 0439 10/10/20 0154  AST 9* 13*  --  11*  ALT 8 10  --  9  ALKPHOS 57 67  --  49  BILITOT 0.5 0.5  --  0.7  PROT 6.1* 6.9  --  5.8*  ALBUMIN 2.5* 2.7* 2.5* 2.2*   Recent Labs    08/14/20 1017 09/14/20 1105 10/08/20 1855 10/10/20 0154 10/11/20 0342 10/12/20 0650 10/15/20 0723  WBC 10.1  --  12.0* 7.7 8.4 9.0 9.3  NEUTROABS 5.9  --  8.5* 5.0  --   --   --   HGB 11.4*   < > 11.6* 10.0* 9.9* 9.9* 10.3*  HCT 36.0*   < > 37.1* 31.7* 30.8* 30.6* 32.2*  MCV 107.8*  --  106.3* 104.3* 105.5* 103.4*  105.2*  PLT 108*  --  222 155 147* 140* 140*   < > = values in this interval not displayed.   Lab Results  Component Value Date   TSH 2.46 10/20/2019   Lab Results  Component Value Date   HGBA1C 5.1 08/27/2019   Lab Results  Component Value Date   CHOL 161 10/20/2019   HDL 54 10/20/2019   LDLCALC 86 10/20/2019   TRIG 112 10/20/2019   CHOLHDL 3.0 10/20/2019    Significant Diagnostic Results in last 30 days:  DG Chest Portable 1 View  Result Date: 10/08/2020 CLINICAL DATA:  Weakness EXAM: PORTABLE CHEST 1 VIEW COMPARISON:  08/14/2020 FINDINGS: Left-sided pacing device. No focal opacity or pleural effusion. Normal cardiomediastinal silhouette. No pneumothorax IMPRESSION: No active disease. Electronically Signed   By: Donavan Foil M.D.   On: 10/08/2020 20:56   CUP PACEART REMOTE DEVICE CHECK  Result Date: 10/02/2020 Scheduled remote reviewed. Normal device function.  Next remote 91 days- JBox, RN/CVRS   Assessment/Plan  1. COVID-19 virus infection -  resolved  2. Essential hypertension, benign - hydrALAZINE (APRESOLINE) 25 MG tablet; Take 1 tablet (25 mg total) by mouth 3 (three) times daily as needed (High BP, SBP>130).  Dispense: 30 tablet; Refill: 0 - ivabradine (CORLANOR) 5 MG TABS tablet; TAKE 1 TABLET(5 MG) BY MOUTH TWICE DAILY WITH A MEAL  Dispense: 60 tablet; Refill: 0  3. Type 2 diabetes mellitus with stage 3b chronic kidney disease, without long-term current use of insulin (HCC) - dapagliflozin propanediol (FARXIGA) 10 MG TABS tablet; Take 1 tablet (10 mg total) by mouth daily before breakfast.  Dispense: 30  tablet; Refill: 0  4. Chronic obstructive pulmonary disease, unspecified COPD type (HCC) - budesonide (ENTOCORT EC) 3 MG 24 hr capsule; Take 3 capsules (9 mg total) by mouth daily.  Dispense: 30 capsule; Refill: 0 - guaiFENesin (ROBITUSSIN) 100 MG/5ML SOLN; Take 10 mLs (200 mg total) by mouth 3 (three) times daily for 14 days.  Dispense: 420 mL; Refill: 0  5.  Bipolar 1 disorder (HCC) - divalproex (DEPAKOTE ER) 500 MG 24 hr tablet; Take 3 tablets (1,500 mg total) by mouth at bedtime.  Dispense: 90 tablet; Refill: 0 - QUEtiapine (SEROQUEL) 100 MG tablet; Take 1.5 tablets (150 mg total) by mouth at bedtime.  Dispense: 45 tablet; Refill: 0  6. Anemia in stage 3b chronic kidney disease (HCC) - ferrous sulfate 325 (65 FE) MG tablet; Take 1 tablet (325 mg total) by mouth in the morning and at bedtime.  Dispense: 30 tablet; Refill: 0  7. Chronic systolic heart failure (HCC) - furosemide (LASIX) 20 MG tablet; Take 1 tablet (20 mg total) by mouth daily.  Dispense: 30 tablet; Refill: 0 - isosorbide mononitrate (IMDUR) 60 MG 24 hr tablet; Take 1 tablet (60 mg total) by mouth daily.  Dispense: 30 tablet; Refill: 0  8. Paroxysmal atrial fibrillation (HCC) - apixaban (ELIQUIS) 5 MG TABS tablet; Take 1 tablet (5 mg total) by mouth 2 (two) times daily.  Dispense: 60 tablet; Refill: 0 - metoprolol succinate (TOPROL-XL) 25 MG 24 hr tablet; Take 1 tablet (25 mg total) by mouth in the morning and at bedtime.  Dispense: 60 tablet; Refill: 0  9. Chronic gout without tophus, unspecified cause, unspecified site - allopurinol (ZYLOPRIM) 100 MG tablet; Take 2 tablets (200 mg total) by mouth daily.  Dispense: 60 tablet; Refill: 0  10. Hypomagnesemia - Magnesium 400 MG TABS; Take 400 mg by mouth daily.  Dispense: 30 tablet; Refill: 1  11. Benign prostatic hyperplasia with urinary frequency - tamsulosin (FLOMAX) 0.4 MG CAPS capsule; Take 1 capsule (0.4 mg total) by mouth daily.  Dispense: 30 capsule; Refill: 0  12. Poor appetite - mirtazapine (REMERON) 7.5 MG tablet; Take 1 tablet (7.5 mg total) by mouth at bedtime. TAKE 1 TABLET(7.5 MG) BY MOUTH AT BEDTIME  Dispense: 30 tablet; Refill: 0      I have filled out patient's discharge paperwork and e-prescribed medications.  Patient will receive home health PT, OT. And Aide  DME provided:  wheeled walker  Total  discharge time: Greater than 30 minutes Greater than 50% was spent in counseling and coordination of care.   Discharge time involved coordination of the discharge process with social worker, nursing staff and therapy department. Medical justification for home health services verified.    Durenda Age, DNP, MSN, FNP-BC [facility] [phone] (Monday-Friday 8:00 a.m. - 5:00 p.m.) [phone] (after hours)

## 2020-10-26 NOTE — Patient Instructions (Addendum)
Instructions below written on SNF order-patient declined AVS   Increase Lasix to 20 mg daily  Labs today and repeat in 10-14 days  Followup x 2 months with Dr [name]

## 2020-10-28 DIAGNOSIS — M6281 Muscle weakness (generalized): Secondary | ICD-10-CM | POA: Diagnosis not present

## 2020-10-28 MED ORDER — HYDRALAZINE HCL 25 MG PO TABS: 25.0000 mg | ORAL_TABLET | Freq: Three times a day (TID) | ORAL | 0 refills | Status: DC | PRN

## 2020-10-28 MED ORDER — MIRTAZAPINE 7.5 MG PO TABS: 7.5000 mg | ORAL_TABLET | Freq: Every day | ORAL | 0 refills | Status: DC

## 2020-10-28 MED ORDER — METOPROLOL SUCCINATE ER 25 MG PO TB24: 25.0000 mg | ORAL_TABLET | Freq: Two times a day (BID) | ORAL | 0 refills | Status: DC

## 2020-10-28 MED ORDER — FUROSEMIDE 20 MG PO TABS: 20.0000 mg | ORAL_TABLET | Freq: Every day | ORAL | 0 refills | Status: DC

## 2020-10-28 MED ORDER — SOLIFENACIN SUCCINATE 5 MG PO TABS: 5.0000 mg | ORAL_TABLET | Freq: Every day | ORAL | 0 refills | Status: DC

## 2020-10-28 MED ORDER — DIVALPROEX SODIUM ER 500 MG PO TB24: 1500.0000 mg | ORAL_TABLET | Freq: Every day | ORAL | 0 refills | Status: DC

## 2020-10-28 MED ORDER — FERROUS SULFATE 325 (65 FE) MG PO TABS: 325.0000 mg | ORAL_TABLET | Freq: Two times a day (BID) | ORAL | 0 refills | Status: DC

## 2020-10-28 MED ORDER — GUAIFENESIN 100 MG/5ML PO SOLN: 10.0000 mL | Freq: Three times a day (TID) | ORAL | 0 refills | Status: DC

## 2020-10-28 MED ORDER — VITAMIN D 125 MCG (5000 UT) PO CAPS: 5000.0000 [IU] | ORAL_CAPSULE | Freq: Every day | ORAL | 0 refills | Status: AC

## 2020-10-28 MED ORDER — ISOSORBIDE MONONITRATE ER 60 MG PO TB24: 60.0000 mg | ORAL_TABLET | Freq: Every day | ORAL | 0 refills | Status: DC

## 2020-10-28 MED ORDER — BUDESONIDE 3 MG PO CPEP: 9.0000 mg | ORAL_CAPSULE | Freq: Every day | ORAL | 0 refills | Status: DC

## 2020-10-28 MED ORDER — POLYETHYLENE GLYCOL 3350 17 G PO PACK: 17.0000 g | PACK | Freq: Every day | ORAL | 1 refills | Status: DC | PRN

## 2020-10-28 MED ORDER — QUETIAPINE FUMARATE 100 MG PO TABS: 150.0000 mg | ORAL_TABLET | Freq: Every day | ORAL | 0 refills | Status: DC

## 2020-10-28 MED ORDER — TAMSULOSIN HCL 0.4 MG PO CAPS: 0.4000 mg | ORAL_CAPSULE | Freq: Every day | ORAL | 0 refills | Status: DC

## 2020-10-28 MED ORDER — APIXABAN 5 MG PO TABS: 5.0000 mg | ORAL_TABLET | Freq: Two times a day (BID) | ORAL | 0 refills | Status: DC

## 2020-10-28 MED ORDER — MAGNESIUM 400 MG PO TABS: 400.0000 mg | ORAL_TABLET | Freq: Every day | ORAL | 1 refills | Status: DC

## 2020-10-28 MED ORDER — OMEPRAZOLE 20 MG PO CPDR: 20.0000 mg | DELAYED_RELEASE_CAPSULE | Freq: Every day | ORAL | 0 refills | Status: DC

## 2020-10-28 MED ORDER — IVABRADINE HCL 5 MG PO TABS: ORAL_TABLET | ORAL | 0 refills | Status: DC

## 2020-10-28 MED ORDER — LOPERAMIDE HCL 2 MG PO CAPS: 2.0000 mg | ORAL_CAPSULE | Freq: Three times a day (TID) | ORAL | 0 refills | Status: AC | PRN

## 2020-10-28 MED ORDER — ALLOPURINOL 100 MG PO TABS: 200.0000 mg | ORAL_TABLET | Freq: Every day | ORAL | 0 refills | Status: DC

## 2020-10-28 MED ORDER — DAPAGLIFLOZIN PROPANEDIOL 10 MG PO TABS: 10.0000 mg | ORAL_TABLET | Freq: Every day | ORAL | 0 refills | Status: DC

## 2020-10-29 ENCOUNTER — Encounter: Payer: Self-pay | Admitting: Adult Health

## 2020-10-29 NOTE — Progress Notes (Signed)
This encounter was created in error - please disregard.

## 2020-10-30 ENCOUNTER — Ambulatory Visit: Payer: Medicare Other | Admitting: Family Medicine

## 2020-10-30 ENCOUNTER — Other Ambulatory Visit: Payer: Self-pay | Admitting: Adult Health

## 2020-10-30 DIAGNOSIS — I5022 Chronic systolic (congestive) heart failure: Secondary | ICD-10-CM

## 2020-10-30 DIAGNOSIS — N401 Enlarged prostate with lower urinary tract symptoms: Secondary | ICD-10-CM

## 2020-10-30 DIAGNOSIS — I480 Paroxysmal atrial fibrillation: Secondary | ICD-10-CM

## 2020-10-30 DIAGNOSIS — M1A9XX0 Chronic gout, unspecified, without tophus (tophi): Secondary | ICD-10-CM

## 2020-10-30 DIAGNOSIS — F319 Bipolar disorder, unspecified: Secondary | ICD-10-CM

## 2020-10-31 ENCOUNTER — Other Ambulatory Visit: Payer: Self-pay

## 2020-10-31 ENCOUNTER — Emergency Department (HOSPITAL_COMMUNITY): Payer: Medicare Other

## 2020-10-31 ENCOUNTER — Inpatient Hospital Stay (HOSPITAL_COMMUNITY)
Admission: EM | Admit: 2020-10-31 | Discharge: 2020-11-03 | DRG: 871 | Disposition: A | Discharge status: Home Health | Payer: Medicare Other | Attending: Internal Medicine | Admitting: Internal Medicine

## 2020-10-31 ENCOUNTER — Ambulatory Visit: Payer: Medicare Other | Admitting: Family

## 2020-10-31 ENCOUNTER — Encounter (HOSPITAL_COMMUNITY): Payer: Self-pay

## 2020-10-31 DIAGNOSIS — G9341 Metabolic encephalopathy: Secondary | ICD-10-CM | POA: Diagnosis not present

## 2020-10-31 DIAGNOSIS — R6510 Systemic inflammatory response syndrome (SIRS) of non-infectious origin without acute organ dysfunction: Secondary | ICD-10-CM | POA: Diagnosis present

## 2020-10-31 DIAGNOSIS — G4700 Insomnia, unspecified: Secondary | ICD-10-CM | POA: Diagnosis present

## 2020-10-31 DIAGNOSIS — N281 Cyst of kidney, acquired: Secondary | ICD-10-CM | POA: Diagnosis not present

## 2020-10-31 DIAGNOSIS — Z95810 Presence of automatic (implantable) cardiac defibrillator: Secondary | ICD-10-CM

## 2020-10-31 DIAGNOSIS — F419 Anxiety disorder, unspecified: Secondary | ICD-10-CM | POA: Diagnosis present

## 2020-10-31 DIAGNOSIS — Z79899 Other long term (current) drug therapy: Secondary | ICD-10-CM

## 2020-10-31 DIAGNOSIS — F319 Bipolar disorder, unspecified: Secondary | ICD-10-CM | POA: Diagnosis present

## 2020-10-31 DIAGNOSIS — U071 COVID-19: Secondary | ICD-10-CM

## 2020-10-31 DIAGNOSIS — N390 Urinary tract infection, site not specified: Secondary | ICD-10-CM | POA: Diagnosis present

## 2020-10-31 DIAGNOSIS — D696 Thrombocytopenia, unspecified: Secondary | ICD-10-CM | POA: Diagnosis present

## 2020-10-31 DIAGNOSIS — I6523 Occlusion and stenosis of bilateral carotid arteries: Secondary | ICD-10-CM | POA: Diagnosis not present

## 2020-10-31 DIAGNOSIS — Z888 Allergy status to other drugs, medicaments and biological substances status: Secondary | ICD-10-CM | POA: Diagnosis not present

## 2020-10-31 DIAGNOSIS — Z87891 Personal history of nicotine dependence: Secondary | ICD-10-CM

## 2020-10-31 DIAGNOSIS — D3500 Benign neoplasm of unspecified adrenal gland: Secondary | ICD-10-CM | POA: Diagnosis not present

## 2020-10-31 DIAGNOSIS — Z8616 Personal history of COVID-19: Secondary | ICD-10-CM

## 2020-10-31 DIAGNOSIS — I5042 Chronic combined systolic (congestive) and diastolic (congestive) heart failure: Secondary | ICD-10-CM | POA: Diagnosis not present

## 2020-10-31 DIAGNOSIS — I130 Hypertensive heart and chronic kidney disease with heart failure and stage 1 through stage 4 chronic kidney disease, or unspecified chronic kidney disease: Secondary | ICD-10-CM | POA: Diagnosis not present

## 2020-10-31 DIAGNOSIS — G928 Other toxic encephalopathy: Secondary | ICD-10-CM | POA: Diagnosis not present

## 2020-10-31 DIAGNOSIS — E1122 Type 2 diabetes mellitus with diabetic chronic kidney disease: Secondary | ICD-10-CM | POA: Diagnosis present

## 2020-10-31 DIAGNOSIS — D539 Nutritional anemia, unspecified: Secondary | ICD-10-CM | POA: Diagnosis not present

## 2020-10-31 DIAGNOSIS — Z20822 Contact with and (suspected) exposure to covid-19: Secondary | ICD-10-CM | POA: Diagnosis not present

## 2020-10-31 DIAGNOSIS — J8410 Pulmonary fibrosis, unspecified: Secondary | ICD-10-CM | POA: Diagnosis not present

## 2020-10-31 DIAGNOSIS — D849 Immunodeficiency, unspecified: Secondary | ICD-10-CM | POA: Diagnosis present

## 2020-10-31 DIAGNOSIS — K5000 Crohn's disease of small intestine without complications: Secondary | ICD-10-CM | POA: Diagnosis not present

## 2020-10-31 DIAGNOSIS — N400 Enlarged prostate without lower urinary tract symptoms: Secondary | ICD-10-CM | POA: Diagnosis present

## 2020-10-31 DIAGNOSIS — Z7901 Long term (current) use of anticoagulants: Secondary | ICD-10-CM

## 2020-10-31 DIAGNOSIS — A419 Sepsis, unspecified organism: Principal | ICD-10-CM | POA: Diagnosis present

## 2020-10-31 DIAGNOSIS — R4701 Aphasia: Secondary | ICD-10-CM | POA: Diagnosis not present

## 2020-10-31 DIAGNOSIS — G4730 Sleep apnea, unspecified: Secondary | ICD-10-CM | POA: Diagnosis present

## 2020-10-31 DIAGNOSIS — I517 Cardiomegaly: Secondary | ICD-10-CM | POA: Diagnosis not present

## 2020-10-31 DIAGNOSIS — R000 Tachycardia, unspecified: Secondary | ICD-10-CM | POA: Diagnosis not present

## 2020-10-31 DIAGNOSIS — N184 Chronic kidney disease, stage 4 (severe): Secondary | ICD-10-CM | POA: Diagnosis not present

## 2020-10-31 DIAGNOSIS — R4781 Slurred speech: Secondary | ICD-10-CM | POA: Diagnosis not present

## 2020-10-31 DIAGNOSIS — Z881 Allergy status to other antibiotic agents status: Secondary | ICD-10-CM | POA: Diagnosis not present

## 2020-10-31 DIAGNOSIS — R197 Diarrhea, unspecified: Secondary | ICD-10-CM | POA: Diagnosis not present

## 2020-10-31 DIAGNOSIS — K5090 Crohn's disease, unspecified, without complications: Secondary | ICD-10-CM | POA: Diagnosis present

## 2020-10-31 DIAGNOSIS — R410 Disorientation, unspecified: Secondary | ICD-10-CM | POA: Diagnosis not present

## 2020-10-31 DIAGNOSIS — Z833 Family history of diabetes mellitus: Secondary | ICD-10-CM

## 2020-10-31 LAB — COMPREHENSIVE METABOLIC PANEL
ALT: 26 U/L (ref 0–44)
AST: 25 U/L (ref 15–41)
Albumin: 2.5 g/dL — ABNORMAL LOW (ref 3.5–5.0)
Alkaline Phosphatase: 81 U/L (ref 38–126)
Anion gap: 7 (ref 5–15)
BUN: 28 mg/dL — ABNORMAL HIGH (ref 8–23)
CO2: 24 mmol/L (ref 22–32)
Calcium: 7.7 mg/dL — ABNORMAL LOW (ref 8.9–10.3)
Chloride: 106 mmol/L (ref 98–111)
Creatinine, Ser: 2.45 mg/dL — ABNORMAL HIGH (ref 0.61–1.24)
GFR, Estimated: 27 mL/min — ABNORMAL LOW (ref >60)
Glucose, Bld: 102 mg/dL — ABNORMAL HIGH (ref 70–99)
Potassium: 3.3 mmol/L — ABNORMAL LOW (ref 3.5–5.1)
Sodium: 137 mmol/L (ref 135–145)
Total Bilirubin: 0.9 mg/dL (ref 0.3–1.2)
Total Protein: 6 g/dL — ABNORMAL LOW (ref 6.5–8.1)

## 2020-10-31 LAB — I-STAT CHEM 8, ED
BUN: 28 mg/dL — ABNORMAL HIGH (ref 8–23)
Calcium, Ion: 1.07 mmol/L — ABNORMAL LOW (ref 1.15–1.40)
Chloride: 106 mmol/L (ref 98–111)
Creatinine, Ser: 2.5 mg/dL — ABNORMAL HIGH (ref 0.61–1.24)
Glucose, Bld: 100 mg/dL — ABNORMAL HIGH (ref 70–99)
HCT: 29 % — ABNORMAL LOW (ref 39.0–52.0)
Hemoglobin: 9.9 g/dL — ABNORMAL LOW (ref 13.0–17.0)
Potassium: 3.4 mmol/L — ABNORMAL LOW (ref 3.5–5.1)
Sodium: 142 mmol/L (ref 135–145)
TCO2: 24 mmol/L (ref 22–32)

## 2020-10-31 LAB — DIFFERENTIAL
Abs Immature Granulocytes: 0.3 10*3/uL — ABNORMAL HIGH (ref 0.00–0.07)
Basophils Absolute: 0 10*3/uL (ref 0.0–0.1)
Basophils Relative: 0 %
Eosinophils Absolute: 0 10*3/uL (ref 0.0–0.5)
Eosinophils Relative: 0 %
Lymphocytes Relative: 4 %
Lymphs Abs: 1.2 10*3/uL (ref 0.7–4.0)
Monocytes Absolute: 1.2 10*3/uL — ABNORMAL HIGH (ref 0.1–1.0)
Monocytes Relative: 4 %
Neutro Abs: 26.5 10*3/uL — ABNORMAL HIGH (ref 1.7–7.7)
Neutrophils Relative %: 91 %
Promyelocytes Relative: 1 %
nRBC: 0 /100 WBC

## 2020-10-31 LAB — PROTIME-INR
INR: 1.4 — ABNORMAL HIGH (ref 0.8–1.2)
Prothrombin Time: 17.4 seconds — ABNORMAL HIGH (ref 11.4–15.2)

## 2020-10-31 LAB — AMMONIA: Ammonia: <9 umol/L — ABNORMAL LOW (ref 9–35)

## 2020-10-31 LAB — URINALYSIS, ROUTINE W REFLEX MICROSCOPIC
Bacteria, UA: NONE SEEN
Bilirubin Urine: NEGATIVE
Glucose, UA: NEGATIVE mg/dL
Hgb urine dipstick: NEGATIVE
Ketones, ur: NEGATIVE mg/dL
Nitrite: NEGATIVE
Protein, ur: 30 mg/dL — AB
Specific Gravity, Urine: 1.038 — ABNORMAL HIGH (ref 1.005–1.030)
pH: 5 (ref 5.0–8.0)

## 2020-10-31 LAB — CBC
HCT: 28.6 % — ABNORMAL LOW (ref 39.0–52.0)
Hemoglobin: 9 g/dL — ABNORMAL LOW (ref 13.0–17.0)
MCH: 34.2 pg — ABNORMAL HIGH (ref 26.0–34.0)
MCHC: 31.5 g/dL (ref 30.0–36.0)
MCV: 108.7 fL — ABNORMAL HIGH (ref 80.0–100.0)
Platelets: 134 10*3/uL — ABNORMAL LOW (ref 150–400)
RBC: 2.63 MIL/uL — ABNORMAL LOW (ref 4.22–5.81)
RDW: 14.6 % (ref 11.5–15.5)
WBC: 29.1 10*3/uL — ABNORMAL HIGH (ref 4.0–10.5)
nRBC: 0 % (ref 0.0–0.2)

## 2020-10-31 LAB — RESP PANEL BY RT-PCR (FLU A&B, COVID) ARPGX2
Influenza A by PCR: NEGATIVE
Influenza B by PCR: NEGATIVE
SARS Coronavirus 2 by RT PCR: POSITIVE — AB

## 2020-10-31 LAB — APTT: aPTT: 34 seconds (ref 24–36)

## 2020-10-31 LAB — TROPONIN I (HIGH SENSITIVITY)
Troponin I (High Sensitivity): 81 ng/L — ABNORMAL HIGH (ref <18)
Troponin I (High Sensitivity): 84 ng/L — ABNORMAL HIGH (ref <18)

## 2020-10-31 LAB — ETHANOL: Alcohol, Ethyl (B): <10 mg/dL (ref <10)

## 2020-10-31 LAB — LACTIC ACID, PLASMA: Lactic Acid, Venous: 1.5 mmol/L (ref 0.5–1.9)

## 2020-10-31 LAB — CBG MONITORING, ED: Glucose-Capillary: 106 mg/dL — ABNORMAL HIGH (ref 70–99)

## 2020-10-31 LAB — VITAMIN B12: Vitamin B-12: 778 pg/mL (ref 180–914)

## 2020-10-31 MED ORDER — IOHEXOL 350 MG/ML SOLN
60.0000 mL | Freq: Once | INTRAVENOUS | Status: AC | PRN
  Administered 2020-10-31: 60 mL via INTRAVENOUS

## 2020-10-31 MED ORDER — SODIUM CHLORIDE 0.9% FLUSH
3.0000 mL | Freq: Once | INTRAVENOUS | Status: AC
Start: 2020-10-31 — End: 2020-10-31
  Administered 2020-10-31: 3 mL via INTRAVENOUS

## 2020-10-31 MED ORDER — SODIUM CHLORIDE 0.9 % IV SOLN
2.0000 g | INTRAVENOUS | Status: DC
  Administered 2020-11-01 – 2020-11-03 (×3): 2 g via INTRAVENOUS
  Filled 2020-10-31 (×3): qty 2

## 2020-10-31 MED ORDER — SODIUM CHLORIDE 0.9 % IV BOLUS
1000.0000 mL | Freq: Once | INTRAVENOUS | Status: AC
  Administered 2020-10-31: 1000 mL via INTRAVENOUS

## 2020-10-31 MED ORDER — ACETAMINOPHEN 500 MG PO TABS
1000.0000 mg | ORAL_TABLET | Freq: Once | ORAL | Status: AC
  Administered 2020-10-31: 1000 mg via ORAL
  Filled 2020-10-31: qty 2

## 2020-10-31 MED ORDER — VANCOMYCIN HCL 1750 MG/350ML IV SOLN
1750.0000 mg | INTRAVENOUS | Status: DC
  Administered 2020-11-01: 1750 mg via INTRAVENOUS
  Filled 2020-10-31: qty 350

## 2020-10-31 NOTE — ED Provider Notes (Signed)
[facility] EMERGENCY DEPARTMENT Provider Note   CSN: [id number] Arrival date & time: 10/31/20  1943  An emergency department physician performed an initial assessment on this suspected stroke patient at 49.  History CC:  Garbled speech  Brent [name] is a 75 y.o. male w/ hx of crohn's disease, HTN, CKD stage 3, CHF (EF 30-35%, medtronic ICD played 11/21), presenting to ED with tremors and confusion.   Supplemental history provided by patient's home nurse Katarina (sp?) at [phone].  She reports he lives alone, but she last saw him behaving normally yesterday evening around 6 pm.  Today at 6 pm she went by to visit him and he was sitting in his car, having difficulty turning on the ignition.  His speech seemed slurred or confused, and when she helped him out he was "trembling all over."  EMS called to scene.  Patient arrives in ED as code stroke with concern for slurred speech.  Patient reports no headache, CP, SOB, but reports feeling shaky and fatigued today.  He reports he has had a cough worse than normal for the past few days.  He denies dysuria or burning with urination.  He denies abdominal pain.  He reports a surgical history of cholecystectomy  Medical records reviewed - cardiology note from 10/26/20 - noted to have [place] in March 2021 with no significant CAD but low filling pressures and cardiac output.   HPI     Past Medical History:  Diagnosis Date   Anemia    Anxiety    Benign paroxysmal positional vertigo    Bipolar I disorder, most recent episode (or current) unspecified    Celiac disease    CHF (congestive heart failure) (HCC)    Chronic kidney disease, stage III (moderate) (HCC)    Crohn's    Remicade q8 weeks   Depression    Essential and other specified forms of tremor    medication-induced Parkinson's, now resolved   Essential hypertension, benign    Gout 2018   Heart failure (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   Neuralgia, neuritis, and radiculitis, unspecified    Other B-complex deficiencies    Other extrapyramidal disease and abnormal movement disorder    Postinflammatory pulmonary fibrosis ([place])    Tobacco use disorder    Vertigo 2018    Patient Active Problem List   Diagnosis Date Noted   SIRS (systemic inflammatory response syndrome) ([place]) 10/31/2020   Neurocognitive deficits 10/17/2020   Hypokalemia 10/08/2020   Hypomagnesemia 10/08/2020   CKD (chronic kidney disease), stage III ([place]) 10/08/2020   COVID-19 virus infection 10/08/2020   Electrolyte abnormality 05/17/2020   ICD (implantable cardioverter-defibrillator) in place 54/65/0354   Chronic systolic heart failure ([place]) 01/02/2020   Decreased appetite 10/08/2019   Weakness 10/08/2019   Hyperkalemia 65/68/1275   Chronic systolic CHF (congestive heart failure) ([place]) 08/26/2019   Type 2 diabetes mellitus with stage 3 chronic kidney disease ([place]) 08/26/2019   Chronic obstructive pulmonary disease ([place]) 06/20/2019   Ventricular tachycardia ([place]) 06/20/2019   Acute on chronic systolic (congestive) heart failure ([place]) 17/00/1749   Metabolic acidosis 44/96/7591   Orthostasis 09/07/2018   AKI (acute kidney injury) ([place])    Immunosuppressed status (HCC)    Macrocytic anemia  Chronic combined systolic and diastolic congestive heart failure ([place]) 06/03/2018   Candida esophagitis ([place]) 09/22/2017   History of smoking 30 or more pack years 01/05/2017   Bipolar 1 disorder (HCC)    BPH (benign prostatic hyperplasia) 03/31/2013   Anemia in chronic kidney disease 03/31/2013   Essential hypertension, benign 03/31/2013   Acute renal failure superimposed on stage 3 chronic kidney disease ([place]) 06/04/2012   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[place]) 01/23/2010    Past  Surgical History:  Procedure Laterality Date   CATARACT EXTRACTION, BILATERAL Bilateral 09/2018   CHOLECYSTECTOMY  07-12-2010   ICD IMPLANT N/A 01/02/2020   Procedure: ICD IMPLANT;  Surgeon: Evans Lance, MD;  Location: [facility];  Service: Cardiovascular;  Laterality: N/A;   RIGHT HEART CATH N/A 10/12/2018   Procedure: RIGHT HEART CATH;  Surgeon: Jolaine Artist, MD;  Location: [facility];  Service: Cardiovascular;  Laterality: N/A;   RIGHT/LEFT HEART CATH AND CORONARY ANGIOGRAPHY N/A 04/26/2019   Procedure: RIGHT/LEFT HEART CATH AND CORONARY ANGIOGRAPHY;  Surgeon: Larey Dresser, MD;  Location: [facility];  Service: Cardiovascular;  Laterality: N/A;   SMALL INTESTINE SURGERY     x 2       Family History  Problem Relation Age of Onset   Diabetes Mother        maternal grandmother   Uterine cancer Mother    Emphysema Father    Pneumonia Maternal Grandmother    Colon cancer Neg Hx     Social History   Tobacco Use   Smoking status: Former    Packs/day: 1.00    Years: 49.00    Pack years: 49.00    Types: Cigarettes    Start date: 04/11/1956    Quit date: 04/17/2014    Years since quitting: 6.5   Smokeless tobacco: Never  Vaping Use   Vaping Use: Never used  Substance Use Topics   Alcohol use: No    Alcohol/week: 0.0 standard drinks   Drug use: No    Home Medications Prior to Admission medications   Medication Sig Start Date End Date Taking? Authorizing Provider  acetaminophen (TYLENOL) 160 MG/5ML liquid Take 10.2 mLs (325 mg total) by mouth every 6 (six) hours as needed for fever or pain. 10/15/20   Hongalgi, Lenis Dickinson, MD  allopurinol (ZYLOPRIM) 100 MG tablet Take 2 tablets (200 mg total) by mouth daily. 10/28/20   Medina-Vargas, Monina C, NP  apixaban (ELIQUIS) 5 MG TABS tablet Take 1 tablet (5 mg total) by mouth 2 (two) times daily. 10/28/20 11/27/20  Medina-Vargas, Monina C, NP  budesonide (ENTOCORT EC) 3 MG 24 hr capsule Take 3 capsules (9 mg  total) by mouth daily. 10/28/20   Medina-Vargas, Monina C, NP  Calcium 500 MG tablet Take 1 tablet (500 mg total) by mouth 1 day or 1 dose for 1 dose. 05/19/20 12/01/20  Geradine Girt, DO  Cholecalciferol (VITAMIN D) 125 MCG (5000 UT) CAPS Take 5,000 Units by mouth daily. 10/28/20   Medina-Vargas, Monina C, NP  dapagliflozin propanediol (FARXIGA) 10 MG TABS tablet Take 1 tablet (10 mg total) by mouth daily before breakfast. 10/28/20   Medina-Vargas, Monina C, NP  divalproex (DEPAKOTE ER) 500 MG 24 hr tablet Take 3 tablets (1,500 mg total) by mouth at bedtime. 10/28/20   Medina-Vargas, Monina C, NP  feeding supplement (ENSURE ENLIVE / ENSURE PLUS) LIQD Take 237 mLs by mouth 2 (two) times daily between meals. 10/15/20   Hongalgi, Lenis Dickinson,  MD  ferrous sulfate 325 (65 FE) MG tablet Take 1 tablet (325 mg total) by mouth in the morning and at bedtime. 10/28/20   Medina-Vargas, Monina C, NP  furosemide (LASIX) 20 MG tablet Take 1 tablet (20 mg total) by mouth daily. 10/28/20   Medina-Vargas, Monina C, NP  guaiFENesin (ROBITUSSIN) 100 MG/5ML SOLN Take 10 mLs (200 mg total) by mouth 3 (three) times daily for 14 days. 10/28/20 11/11/20  Medina-Vargas, Monina C, NP  hydrALAZINE (APRESOLINE) 25 MG tablet Take 1 tablet (25 mg total) by mouth 3 (three) times daily as needed (High BP, SBP>130). 10/28/20   Medina-Vargas, Monina C, NP  inFLIXimab (REMICADE) 100 MG injection Infuse Remicade IV schedule 1 32m/kg every 8 weeks Premedicate with Tylenol 500-6553mby mouth and Benadryl 25-5055my mouth prior to infusion. Last PPD was on 12/2009.  11/22/10   JacMilus BanisterD  isosorbide mononitrate (IMDUR) 60 MG 24 hr tablet Take 1 tablet (60 mg total) by mouth daily. 10/28/20   Medina-Vargas, Monina C, NP  ivabradine (CORLANOR) 5 MG TABS tablet TAKE 1 TABLET(5 MG) BY MOUTH TWICE DAILY WITH A MEAL 10/28/20   Medina-Vargas, Monina C, NP  loperamide (IMODIUM) 2 MG capsule Take 1 capsule (2 mg total) by mouth 3 (three) times daily as needed for  diarrhea or loose stools. 10/28/20   Medina-Vargas, Monina C, NP  Magnesium 400 MG TABS Take 400 mg by mouth daily. 10/28/20   Medina-Vargas, Monina C, NP  metoprolol succinate (TOPROL-XL) 25 MG 24 hr tablet Take 1 tablet (25 mg total) by mouth in the morning and at bedtime. 10/28/20   Medina-Vargas, Monina C, NP  mirtazapine (REMERON) 7.5 MG tablet Take 1 tablet (7.5 mg total) by mouth at bedtime. TAKE 1 TABLET(7.5 MG) BY MOUTH AT BEDTIME 10/28/20   Medina-Vargas, Monina C, NP  Multiple Vitamin (MULTIVITAMIN WITH MINERALS) TABS tablet Take 1 tablet by mouth daily. 10/16/20   Hongalgi, AnaLenis DickinsonD  Nutritional Supplements (NUTRITIONAL SUPPLEMENT PO) Take by mouth daily. Magic Cup    [provider]  omeprazole (PRILOSEC) 20 MG capsule Take 1 capsule (20 mg total) by mouth daily. 10/28/20   Medina-Vargas, Monina C, NP  polyethylene glycol (MIRALAX / GLYCOLAX) 17 g packet Take 17 g by mouth daily as needed. Give Miralax with 6 oz of water by mouth as needed for constipation 10/28/20   Medina-Vargas, Monina C, NP  QUEtiapine (SEROQUEL) 100 MG tablet Take 1.5 tablets (150 mg total) by mouth at bedtime. 10/28/20   Medina-Vargas, Monina C, NP  solifenacin (VESICARE) 5 MG tablet Take 1 tablet (5 mg total) by mouth daily. 10/28/20   Medina-Vargas, Monina C, NP  tamsulosin (FLOMAX) 0.4 MG CAPS capsule Take 1 capsule (0.4 mg total) by mouth daily. 10/28/20   Medina-Vargas, Monina C, NP  vitamin B-12 (CYANOCOBALAMIN) 500 MCG tablet Take 1,000 mcg by mouth daily.     [provider]    Allergies    Azathioprine, Ciprofloxacin, Levaquin [levofloxacin in d5w], and Plendil [felodipine]  Review of Systems   Review of Systems  Constitutional:  Negative for chills and fever.  Eyes:  Negative for pain and visual disturbance.  Respiratory:  Negative for cough and shortness of breath.   Cardiovascular:  Negative for chest pain and palpitations.  Gastrointestinal:  Negative for abdominal pain and vomiting.   Genitourinary:  Negative for dysuria and hematuria.  Musculoskeletal:  Negative for arthralgias and back pain.  Skin:  Negative for color change and rash.  Neurological:  Negative for syncope and headaches.  All other systems reviewed and are negative.  Physical Exam Updated Vital Signs BP (!) 156/95   Pulse 89   Temp 98.8 F (37.1 C) (Oral)   Resp 19   Ht 6' 3.98" (1.93 m)   Wt 76.2 kg   SpO2 98%   BMI 20.46 kg/m   Physical Exam Constitutional:      General: He is not in acute distress. HENT:     Head: Normocephalic and atraumatic.  Eyes:     Conjunctiva/sclera: Conjunctivae normal.     Pupils: Pupils are equal, round, and reactive to light.  Cardiovascular:     Rate and Rhythm: Normal rate and regular rhythm.     Pulses: Normal pulses.  Pulmonary:     Effort: Pulmonary effort is normal. No respiratory distress.     Breath sounds: Normal breath sounds.  Abdominal:     General: There is no distension.     Tenderness: There is no abdominal tenderness.  Skin:    General: Skin is warm and dry.  Neurological:     General: No focal deficit present.     Mental Status: He is alert and oriented to person, place, and time. Mental status is at baseline.     Sensory: No sensory deficit.     Motor: No weakness.     Gait: Gait normal.    ED Results / Procedures / Treatments   Labs (all labs ordered are listed, but only abnormal results are displayed) Labs Reviewed  RESP PANEL BY RT-PCR (FLU A&B, COVID) ARPGX2 - Abnormal; Notable for the following components:      Result Value   SARS Coronavirus 2 by RT PCR POSITIVE (*)    All other components within normal limits  PROTIME-INR - Abnormal; Notable for the following components:   Prothrombin Time 17.4 (*)    INR 1.4 (*)    All other components within normal limits  CBC - Abnormal; Notable for the following components:   WBC 29.1 (*)    RBC 2.63 (*)    Hemoglobin 9.0 (*)    HCT 28.6 (*)    MCV 108.7 (*)    MCH 34.2  (*)    Platelets 134 (*)    All other components within normal limits  DIFFERENTIAL - Abnormal; Notable for the following components:   Neutro Abs 26.5 (*)    Monocytes Absolute 1.2 (*)    Abs Immature Granulocytes 0.30 (*)    All other components within normal limits  COMPREHENSIVE METABOLIC PANEL - Abnormal; Notable for the following components:   Potassium 3.3 (*)    Glucose, Bld 102 (*)    BUN 28 (*)    Creatinine, Ser 2.45 (*)    Calcium 7.7 (*)    Total Protein 6.0 (*)    Albumin 2.5 (*)    GFR, Estimated 27 (*)    All other components within normal limits  AMMONIA - Abnormal; Notable for the following components:   Ammonia <9 (*)    All other components within normal limits  URINALYSIS, ROUTINE W REFLEX MICROSCOPIC - Abnormal; Notable for the following components:   APPearance HAZY (*)    Specific Gravity, Urine 1.038 (*)    Protein, ur 30 (*)    Leukocytes,Ua SMALL (*)    All other components within normal limits  HEPATIC FUNCTION PANEL - Abnormal; Notable for the following components:   Albumin 2.6 (*)    All other components within normal limits  BASIC METABOLIC PANEL - Abnormal; Notable for the following components:   Potassium 3.4 (*)    Glucose, Bld 125 (*)    BUN 27 (*)    Creatinine, Ser 2.32 (*)    Calcium 7.8 (*)    GFR, Estimated 29 (*)    All other components within normal limits  CBC WITH DIFFERENTIAL/PLATELET - Abnormal; Notable for the following components:   WBC 29.6 (*)    RBC 2.81 (*)    Hemoglobin 9.7 (*)    HCT 31.1 (*)    MCV 110.7 (*)    MCH 34.5 (*)    Platelets 141 (*)    Neutro Abs 27.2 (*)    Monocytes Absolute 1.2 (*)    Basophils Absolute 0.3 (*)    All other components within normal limits  I-STAT CHEM 8, ED - Abnormal; Notable for the following components:   Potassium 3.4 (*)    BUN 28 (*)    Creatinine, Ser 2.50 (*)    Glucose, Bld 100 (*)    Calcium, Ion 1.07 (*)    Hemoglobin 9.9 (*)    HCT 29.0 (*)    All other  components within normal limits  CBG MONITORING, ED - Abnormal; Notable for the following components:   Glucose-Capillary 106 (*)    All other components within normal limits  TROPONIN I (HIGH SENSITIVITY) - Abnormal; Notable for the following components:   Troponin I (High Sensitivity) 84 (*)    All other components within normal limits  TROPONIN I (HIGH SENSITIVITY) - Abnormal; Notable for the following components:   Troponin I (High Sensitivity) 81 (*)    All other components within normal limits  CULTURE, BLOOD (ROUTINE X 2)  CULTURE, BLOOD (ROUTINE X 2)  C DIFFICILE QUICK SCREEN W PCR REFLEX    GASTROINTESTINAL PANEL BY PCR, STOOL (REPLACES STOOL CULTURE)  APTT  ETHANOL  VITAMIN B12  LACTIC ACID, PLASMA  TSH  RAPID URINE DRUG SCREEN, HOSP PERFORMED  PROCALCITONIN    EKG EKG Interpretation  Date/Time:  Wednesday October 31 2020 20:21:29 EDT Ventricular Rate:  110 PR Interval:  176 QRS Duration: 95 QT Interval:  337 QTC Calculation: 456 R Axis:   14 Text Interpretation: Sinus tachycardia Borderline repolarization abnormality Confirmed by Octaviano Glow [phone]) on 10/31/2020 8:34:26 PM Also confirmed by Octaviano Glow [phone]), editor Stetler, Angela [phone])  on 11/01/2020 7:42:12 AM  Radiology DG Chest Portable 1 View  Result Date: 10/31/2020 CLINICAL DATA:  Confusion, slurred speech EXAM: PORTABLE CHEST 1 VIEW COMPARISON:  10/08/2020 FINDINGS: Left AICD remains in place, unchanged. Cardiomegaly, vascular congestion. No confluent opacities, effusions or overt edema. IMPRESSION: Cardiomegaly, vascular congestion. Electronically Signed   By: Rolm Baptise M.D.   On: 10/31/2020 20:39   CT HEAD CODE STROKE WO CONTRAST  Result Date: 10/31/2020 CLINICAL DATA:  Code stroke.  Neuro deficit, acute, stroke suspected EXAM: CT HEAD WITHOUT CONTRAST TECHNIQUE: Contiguous axial images were obtained from the base of the skull through the vertex without intravenous contrast. COMPARISON:  July  2020 FINDINGS: Brain: There is no acute intracranial hemorrhage, mass effect, or edema. No new loss of gray-white differentiation. Prominence of the ventricles and sulci reflects parenchymal volume loss with mild progression since 2020. Patchy hypoattenuation in the supratentorial white matter is nonspecific but probably reflects mild to moderate chronic microvascular ischemic changes. No extra-axial collection. Vascular: No hyperdense vessel. Skull: Unremarkable. Sinuses/Orbits: Paranasal sinus mucosal thickening. No significant orbital abnormality. Other: Mastoid air cells are clear. ASPECTS [facility] Stroke  Program Early CT Score) - Ganglionic level infarction (caudate, lentiform nuclei, internal capsule, insula, M1-M3 cortex): 7 - Supraganglionic infarction (M4-M6 cortex): 3 Total score (0-10 with 10 being normal): 10 IMPRESSION: There is no acute intracranial hemorrhage or evidence of acute infarction. ASPECT score is 10. Chronic microvascular ischemic changes. These results were communicated to Dr. Rory Percy at 8:00 pm on 10/31/2020 by text page via the [facility] messaging system. Electronically Signed   By: Macy Mis M.D.   On: 10/31/2020 20:03   CT ANGIO HEAD NECK W WO CM (CODE STROKE)  Result Date: 10/31/2020 CLINICAL DATA:  Neuro deficit, acute, stroke suspected; abnormal speech EXAM: CT ANGIOGRAPHY HEAD AND NECK TECHNIQUE: Multidetector CT imaging of the head and neck was performed using the standard protocol during bolus administration of intravenous contrast. Multiplanar CT image reconstructions and MIPs were obtained to evaluate the vascular anatomy. Carotid stenosis measurements (when applicable) are obtained utilizing NASCET criteria, using the distal internal carotid diameter as the denominator. CONTRAST:  49m OMNIPAQUE IOHEXOL 350 MG/ML SOLN COMPARISON:  None. FINDINGS: CTA NECK Aortic arch: Great vessel origins are patent. There is direct origin of the left vertebral artery from the arch. Right  carotid system: Patent. Trace calcified plaque along the proximal internal carotid without stenosis. Left carotid system: Patent. Mild mixed plaque along the proximal internal carotid with minimal stenosis. Partially retropharyngeal course. Vertebral arteries: Patent. Right vertebral artery slightly dominant. No stenosis. Skeleton: Degenerative changes of the cervical spine. Other neck: Unremarkable. Upper chest: No apical lung mass. Review of the MIP images confirms the above findings CTA HEAD Anterior circulation: The intracranial internal carotid arteries are patent. Anterior and middle cerebral arteries are patent. Posterior circulation: Intracranial vertebral arteries are patent. Basilar artery is patent. Major cerebral artery origins are patent. The vertebrobasilar system is small in caliber secondary to bilateral posterior communicating arteries being the primary supply of the patent posterior cerebral arteries. Venous sinuses: Patent as allowed by contrast bolus timing. Review of the MIP images confirms the above findings IMPRESSION: No large vessel occlusion or hemodynamically significant stenosis. Electronically Signed   By: PMacy MisM.D.   On: 10/31/2020 20:27    Procedures .Critical Care  Date/Time: 10/31/2020 8:47 PM Performed by: TWyvonnia Dusky MD Authorized by: TWyvonnia Dusky MD   Critical care provider statement:    Critical care time (minutes):  45   Critical care was necessary to treat or prevent imminent or life-threatening deterioration of the following conditions:  Sepsis   Critical care was time spent personally by me on the following activities:  Discussions with consultants, evaluation of patient's response to treatment, examination of patient, ordering and performing treatments and interventions, ordering and review of laboratory studies, ordering and review of radiographic studies, pulse oximetry, re-evaluation of patient's condition, obtaining history from patient  or surrogate and review of old charts   Medications Ordered in ED Medications  ceFEPIme (MAXIPIME) 2 g in sodium chloride 0.9 % 100 mL IVPB (0 g Intravenous Stopped 11/01/20 0049)  vancomycin (VANCOREADY) IVPB 1750 mg/350 mL (0 mg Intravenous Stopped 11/01/20 0248)  allopurinol (ZYLOPRIM) tablet 200 mg (has no administration in time range)  hydrALAZINE (APRESOLINE) tablet 25 mg (has no administration in time range)  isosorbide mononitrate (IMDUR) 24 hr tablet 60 mg (has no administration in time range)  ivabradine (CORLANOR) tablet 5 mg (has no administration in time range)  metoprolol succinate (TOPROL-XL) 24 hr tablet 25 mg (25 mg Oral Given 11/01/20 0307)  mirtazapine (REMERON) tablet 7.5  mg (7.5 mg Oral Given 11/01/20 0357)  budesonide (ENTOCORT EC) 24 hr capsule 9 mg (has no administration in time range)  pantoprazole (PROTONIX) EC tablet 40 mg (has no administration in time range)  darifenacin (ENABLEX) 24 hr tablet 7.5 mg (has no administration in time range)  tamsulosin (FLOMAX) capsule 0.4 mg (has no administration in time range)  apixaban (ELIQUIS) tablet 5 mg (has no administration in time range)  ferrous sulfate tablet 325 mg (has no administration in time range)  vitamin B-12 (CYANOCOBALAMIN) tablet 1,000 mcg (has no administration in time range)  divalproex (DEPAKOTE ER) 24 hr tablet 1,500 mg (1,500 mg Oral Given 11/01/20 0357)  calcium carbonate (OS-CAL - dosed in mg of elemental calcium) tablet 1,250 mg (has no administration in time range)  cholecalciferol (VITAMIN D3) tablet 5,000 Units (has no administration in time range)  feeding supplement (ENSURE ENLIVE / ENSURE PLUS) liquid 237 mL (has no administration in time range)  magnesium oxide (MAG-OX) tablet 400 mg (has no administration in time range)  multivitamin with minerals tablet 1 tablet (has no administration in time range)  0.9 %  sodium chloride infusion ( Intravenous New Bag/Given 11/01/20 0307)  acetaminophen (TYLENOL)  tablet 650 mg (has no administration in time range)    Or  acetaminophen (TYLENOL) suppository 650 mg (has no administration in time range)  QUEtiapine (SEROQUEL) tablet 150 mg (has no administration in time range)  sodium chloride flush (NS) 0.9 % injection 3 mL (3 mLs Intravenous Given 10/31/20 2026)  iohexol (OMNIPAQUE) 350 MG/ML injection 60 mL (60 mLs Intravenous Contrast Given 10/31/20 2007)  sodium chloride 0.9 % bolus 1,000 mL (0 mLs Intravenous Stopped 10/31/20 2215)  acetaminophen (TYLENOL) tablet 1,000 mg (1,000 mg Oral Given 10/31/20 2100)  potassium chloride (KLOR-CON) CR tablet 10 mEq (10 mEq Oral Given 11/01/20 0357)    ED Course  I have reviewed the triage vital signs and the nursing notes.  Pertinent labs & imaging results that were available during my care of the patient were reviewed by me and considered in my medical decision making (see chart for details).  Patient is here with concern for confusion, possibly garbled speech, earlier today.  He has no focal deficits on his neuro exam on arrival.  Given concern for possible garbled speech she arrives a code stroke was evaluated with neurology team, proceeded to CT scans at that time.  He is now back in his room and essentially asymptomatic.  His speech is a little thick, but is not clear to me if this is off of his baseline.  Supplemental history is provided by his home nurse over the phone.  The patient lives alone.  He will need an additional metabolic and infectious work-up for possible tremors and confusion.  *  Initial workup concerning and consistent with an infection. Chest x-ray shows no obvious infiltrate but the patient does report a productive cough of the past few days.  Pneumonia is a possibility.  He has a benign abdominal exam and reports a history of cholecystectomy, no GI symptoms.  Less likely an acute intra-abdominal process.  We are awaiting a UA.  He is otherwise mentating well.  He denies headache.  He has no  photophobia or nuchal rigidity to suggest meningitis  *    Clinical Course as of 11/01/20 0907  Wed Oct 31, 2020  2033 IMPRESSION: No large vessel occlusion or hemodynamically significant stenosis. [MT]  2033 IMPRESSION: There is no acute intracranial hemorrhage or evidence of acute  infarction. ASPECT score is 10.   Chronic microvascular ischemic changes. [MT]  2040 WBC(!): 29.1 [MT]  2042 Patient is tachycardic and borderline febrile and has an elevated white blood cell count, concerning for an infection.  I have ordered blood cultures and a lactate level to be drawn.  His blood pressure stable and he does not appear to be in shock.  There is no clear source for an infection at this point, but will check xray chest and UA. [MT]  2042 A single liter of fluid was ordered, and I would proceed with fluids judiciously given his history of heart failure.  X-ray does not show any obvious infiltrates. [MT]  2154 SARS Coronavirus 2 by RT PCR(!): POSITIVE [MT]  2332 Patient admitted to hospitalist. UA without convincing signs of infection, but with productive cough and leukocytosis, will begin treatment for HCAP. [MT]    Clinical Course User Index [MT] Buddy Loeffelholz, Carola Rhine, MD    Final Clinical Impression(s) / ED Diagnoses Final diagnoses:  Sepsis, due to unspecified organism, unspecified whether acute organ dysfunction present [facility])  COVID-19    Rx / DC Orders ED Discharge Orders     None        Wyvonnia Dusky, MD 11/01/20 [phone]

## 2020-10-31 NOTE — ED Triage Notes (Signed)
Per EMS, home health nurse found pt in car confused with slurred speech, according to EMS LKNT was 1800 hours, no facial droop, ataxia or slurred speech noted upon exam, no unilateral weakness noted.

## 2020-10-31 NOTE — Consult Note (Addendum)
Neurology Consultation  Reason for Consult: Code stroke - speech disturbance, altered mental status Referring Physician: Dr Langston Masker  CC:  speech disturbance, altered mental status  History is obtained from: Chart, patient's nurse via the phone, patient  HPI: Brent [name] is a 75 y.o. 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 Rexene Alberts at [facility] neurology, brought in for concern for speech disturbance and altered mental status-code stroke activated. EMS was provided history that his last known well was at 6 PM today when his home care nurse came and saw him and he was sitting in his car appearing confused and his speech being slurred.  She had last seen him normal yesterday 10/30/2020 at 6 PM in his normal state of health. Upon seeing him sitting in the car trying to put the car and drive without turning the engine over, she asked him to give her the keys, got him out of the car.  He appeared diaphoretic and was appearing to be shaky all over with his speech extremely difficult to comprehend.  Usually his speech is normally comprehensible. EMS was called.  They also noted slurred speech and activated a code stroke because of the history provided to them with a last known well of 6 PM today rather than 6 PM yesterday.  This was later clarified-last known well 6 PM, 10/30/2020 Denies any chest pain shortness of breath.  Denies headache.  Denies nausea vomiting. Did appear that he had a fall with a small bruise on his right forehead-unwitnessed.  Unknown if he had loss of consciousness with the fall. Has a pacemaker in place.  Blood pressure 158/98 CBG 136 LKW: 6 PM on 10/30/2020 tpa given?: no, outside the window and also on Eliquis. Premorbid modified Rankin scale (mRS): 2-3  ROS: Full ROS was performed and is  negative except as noted in the HPI.   Past Medical History:  Diagnosis Date   Anemia    Anxiety    Benign paroxysmal positional vertigo    Bipolar I disorder, most recent episode (or current) unspecified    Celiac disease    CHF (congestive heart failure) (HCC)    Chronic kidney disease, stage III (moderate) (HCC)    Crohn's    Remicade q8 weeks   Depression    Essential and other specified forms of tremor    medication-induced Parkinson's, now resolved   Essential hypertension, benign    Gout 2018   Heart failure (HCC)    Hypertrophy of prostate without urinary obstruction and other lower urinary tract symptoms (LUTS)    Impotence of organic origin    Insomnia with sleep apnea, unspecified    Iron deficiency anemia, unspecified    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 His smoking use included cigarettes. He started  smoking about 64 years ago. He has a 49.00 pack-year smoking history. He has never used smokeless tobacco. He reports that he does not drink alcohol and does not use drugs.  Medications No current facility-administered medications for this encounter.  Current Outpatient Medications:    acetaminophen (TYLENOL) 160 MG/5ML liquid, Take 10.2 mLs (325 mg total) by mouth every 6 (six) hours as needed for fever or pain., Disp: , Rfl:    allopurinol (ZYLOPRIM) 100 MG tablet, Take 2 tablets (200 mg total) by mouth daily., Disp: 60 tablet, Rfl: 0   apixaban (ELIQUIS) 5 MG TABS tablet, Take  1 tablet (5 mg total) by mouth 2 (two) times daily., Disp: 60 tablet, Rfl: 0   budesonide (ENTOCORT EC) 3 MG 24 hr capsule, Take 3 capsules (9 mg total) by mouth daily., Disp: 30 capsule, Rfl: 0   Calcium 500 MG tablet, Take 1 tablet (500 mg total) by mouth 1 day or 1 dose for 1 dose., Disp: 30 tablet, Rfl:    Cholecalciferol (VITAMIN D) 125 MCG (5000 UT) CAPS, Take 5,000 Units by mouth daily., Disp: 30 capsule, Rfl: 0   dapagliflozin propanediol (FARXIGA) 10 MG TABS tablet, Take 1 tablet (10 mg total) by mouth daily before breakfast., Disp: 30 tablet, Rfl: 0   divalproex (DEPAKOTE ER) 500 MG 24 hr tablet, Take 3 tablets (1,500 mg total) by mouth at bedtime., Disp: 90 tablet, Rfl: 0   feeding supplement (ENSURE ENLIVE / ENSURE PLUS) LIQD, Take 237 mLs by mouth 2 (two) times daily between meals., Disp: , Rfl:    ferrous sulfate 325 (65 FE) MG tablet, Take 1 tablet (325 mg total) by mouth in the morning and at bedtime., Disp: 30 tablet, Rfl: 0   furosemide (LASIX) 20 MG tablet, Take 1 tablet (20 mg total) by mouth daily., Disp: 30 tablet, Rfl: 0   guaiFENesin (ROBITUSSIN) 100 MG/5ML SOLN, Take 10 mLs (200 mg total) by mouth 3 (three) times daily for 14 days., Disp: 420 mL, Rfl: 0   hydrALAZINE (APRESOLINE) 25 MG tablet, Take 1 tablet (25 mg total) by mouth 3 (three) times daily as needed (High BP, SBP>130)., Disp: 30 tablet, Rfl: 0   inFLIXimab (REMICADE) 100 MG injection, Infuse Remicade IV schedule 1 52m/kg every 8 weeks Premedicate with Tylenol 500-6534mby mouth and Benadryl 25-5020my mouth prior to infusion. Last PPD was on 12/2009. , Disp: 1 each, Rfl: 6   isosorbide mononitrate (IMDUR) 60 MG 24 hr tablet, Take 1 tablet (60 mg total) by mouth daily., Disp: 30 tablet, Rfl: 0   ivabradine (CORLANOR) 5 MG TABS tablet, TAKE 1 TABLET(5 MG) BY MOUTH TWICE DAILY WITH A MEAL, Disp: 60 tablet, Rfl: 0   loperamide (IMODIUM) 2 MG capsule, Take 1 capsule (2 mg total) by mouth 3 (three) times daily as needed  for diarrhea or loose stools., Disp: 30 capsule, Rfl: 0   Magnesium 400 MG TABS, Take 400 mg by mouth daily., Disp: 30 tablet, Rfl: 1   metoprolol succinate (TOPROL-XL) 25 MG 24 hr tablet, Take 1 tablet (25 mg total) by mouth in the morning and at bedtime., Disp: 60 tablet, Rfl: 0   mirtazapine (REMERON) 7.5 MG tablet, Take 1 tablet (7.5 mg total) by mouth at bedtime. TAKE 1 TABLET(7.5 MG) BY MOUTH AT BEDTIME, Disp: 30 tablet, Rfl: 0   Multiple Vitamin (MULTIVITAMIN WITH MINERALS) TABS tablet, Take 1 tablet by mouth daily., Disp: , Rfl:    Nutritional Supplements (NUTRITIONAL SUPPLEMENT PO), Take by mouth daily.  Magic Cup, Disp: , Rfl:    omeprazole (PRILOSEC) 20 MG capsule, Take 1 capsule (20 mg total) by mouth daily., Disp: 30 capsule, Rfl: 0   polyethylene glycol (MIRALAX / GLYCOLAX) 17 g packet, Take 17 g by mouth daily as needed. Give Miralax with 6 oz of water by mouth as needed for constipation, Disp: 14 each, Rfl: 1   QUEtiapine (SEROQUEL) 100 MG tablet, Take 1.5 tablets (150 mg total) by mouth at bedtime., Disp: 45 tablet, Rfl: 0   solifenacin (VESICARE) 5 MG tablet, Take 1 tablet (5 mg total) by mouth daily., Disp: 30 tablet, Rfl: 0   tamsulosin (FLOMAX) 0.4 MG CAPS capsule, Take 1 capsule (0.4 mg total) by mouth daily., Disp: 30 capsule, Rfl: 0   vitamin B-12 (CYANOCOBALAMIN) 500 MCG tablet, Take 1,000 mcg by mouth daily. , Disp: , Rfl:    Exam: Current vital signs: BP (!) 149/85   Pulse (!) 104   Temp 100.3 F (37.9 C) (Oral)   Resp (!) 22   Ht 6' 3.98" (1.93 m)   Wt 76.2 kg   SpO2 95%   BMI 20.46 kg/m  Vital signs in last 24 hours: Temp:  [100.3 F (37.9 C)] 100.3 F (37.9 C) (09/07 2032) Pulse Rate:  [100-109] 104 (09/07 2100) Resp:  [17-22] 22 (09/07 2100) BP: (149-158)/(85-89) 149/85 (09/07 2100) SpO2:  [95 %-98 %] 95 % (09/07 2100) Weight:  [76.2 kg] 76.2 kg (09/07 2033)  General: Awake alert in no distress HEENT: Normocephalic, mild bruise on the right  forehead CVS: Regular rate rhythm Respiratory: Chest clear Abdomen nondistended nontender Extremities warm well perfused Psych: Mood and affect normal Neurological exam Awake alert oriented to self, the fact he was in the hospital, month September.  Not able to tell me the year.  He is able to tell me his age correctly.  Speech is not dysarthric.  No evidence of aphasia His attention concentration definitely is diminished. Cranial nerves: Pupils equal round reactive to light, extraocular movements intact, visual fields full, facial sensation intact, face symmetric, reticulate intact, tongue and palate midline. Motor exam: 5/5 antigravity without drift in all 4 extremities.  Normal tone.  Normal range of motion Sensory exam: Intact to light touch in all 4 extremities and on the face.  No extinction to double simultaneous stimulation Coordination: Mildly tremulous all over without obvious dysmetria. Gait testing deferred at this time. NIH stroke scale at [place] hrs.-0  Labs I have reviewed labs in epic and the results pertinent to this consultation are: Today's labs with worsening renal function. CBC    Component Value Date/Time   WBC 29.1 (H) 10/31/2020 1945   RBC 2.63 (L) 10/31/2020 1945   HGB 9.9 (L) 10/31/2020 2021   HGB 12.2 (L) 12/30/2019 1025   HGB 11.4 (L) 11/19/2011 0952   HCT 29.0 (L) 10/31/2020 2021   HCT 37.1 (L) 12/30/2019 1025   HCT 35.6 (L) 11/19/2011 0952   PLT 134 (L) 10/31/2020 1945   PLT 177 12/30/2019 1025   MCV 108.7 (H) 10/31/2020 1945   MCV 101 (H) 12/30/2019 1025   MCV 104.5 (H) 11/19/2011 0952   MCH 34.2 (H) 10/31/2020 1945   MCHC 31.5 10/31/2020 1945   RDW 14.6 10/31/2020 1945   RDW 13.0 12/30/2019 1025   RDW 14.1 11/19/2011 0952   LYMPHSABS 1.2 10/31/2020 1945   LYMPHSABS 3.5 (H) 12/30/2019 1025   LYMPHSABS 2.8 11/19/2011 0952   MONOABS 1.2 (H) 10/31/2020 1945   MONOABS 0.5 11/19/2011 8032  EOSABS 0.0 10/31/2020 1945   EOSABS 0.2 12/30/2019 1025    BASOSABS 0.0 10/31/2020 1945   BASOSABS 0.0 12/30/2019 1025   BASOSABS 0.0 11/19/2011 0952    CMP     Component Value Date/Time   NA 142 10/31/2020 2021   NA 140 12/30/2019 1025   NA 143 11/19/2011 0952   K 3.4 (L) 10/31/2020 2021   K 3.9 11/19/2011 0952   CL 106 10/31/2020 2021   CL 110 (H) 11/19/2011 0952   CO2 24 10/31/2020 1945   CO2 23 11/19/2011 0952   GLUCOSE 100 (H) 10/31/2020 2021   GLUCOSE 80 11/19/2011 0952   BUN 28 (H) 10/31/2020 2021   BUN 31 (H) 12/30/2019 1025   BUN 15.0 11/19/2011 0952   CREATININE 2.50 (H) 10/31/2020 2021   CREATININE 2.90 (H) 06/26/2020 1434   CREATININE 1.8 (H) 11/19/2011 0952   CALCIUM 7.7 (L) 10/31/2020 1945   CALCIUM 6.1 05/18/2020 0524   CALCIUM 9.2 11/19/2011 0952   PROT 6.0 (L) 10/31/2020 1945   PROT 6.2 08/10/2015 1322   PROT 6.9 11/19/2011 0952   ALBUMIN 2.5 (L) 10/31/2020 1945   ALBUMIN 3.5 08/10/2015 1322   ALBUMIN 3.4 (L) 11/19/2011 0952   AST 25 10/31/2020 1945   AST 22 11/19/2011 0952   ALT 26 10/31/2020 1945   ALT 24 11/19/2011 0952   ALKPHOS 81 10/31/2020 1945   ALKPHOS 94 11/19/2011 0952   BILITOT 0.9 10/31/2020 1945   BILITOT 0.3 08/10/2015 1322   BILITOT 0.40 11/19/2011 0952   GFRNONAA 27 (L) 10/31/2020 1945   GFRNONAA 30 (L) 10/20/2019 1158   GFRAA 27 (L) 12/30/2019 1025   GFRAA 35 (L) 10/20/2019 1158     Imaging I have reviewed the images obtained:  CT-head-aspects 10.  No bleed.  No acute changes. CTA head and neck with no emergent LVO  Assessment: 75 year old with past history of paroxysmal atrial fibrillation on Eliquis, bipolar disorder, BPPV, anxiety, heart failure, postinflammatory pulmonary fibrosis, extra pyramidal movement disorder, presenting for evaluation of slurred speech and altered mental status for the last known well initially relayed to EMS at 6 PM today but on further history taking last known well was 6 PM yesterday. No obvious stroke or bleed on the noncontrasted head CT NIH  stroke scale-patient is 0 but does have some attention concentration deficits-exam are consistent with encephalopathy Given history of atrial fibrillation, scattered embolic ischemic strokes cannot be ruled out.  Has a pacemaker-we will need MRI in the morning when cardiology is available to monitor. In the interim, needs toxic metabolic work-up.  Recommendations: MRI of the brain without contrast when able to Check CBC BMP, B12, TSH, urinalysis, chest x-ray. Replete electrolytes if abnormal. At baseline has CKD-did receive IV contrast for CTA, would benefit from gentle IV hydration to minimize adverse effects on his renal function. Would benefit from observation for the above If MRI shows stroke-then only complete the full stroke work-up. Plan discussed with Dr. Octaviano Glow, ED provider and patient's bedside RN Code stroke can be canceled  ADDENDUM Covid + Leukocytosis to 29K Possibly septic - w/u per ER and hospitalist Less likely stroke. MRI would still be helpful    -- Amie Portland, MD Neurologist [facility] Pager: [phone]

## 2020-10-31 NOTE — Progress Notes (Addendum)
Pharmacy Antibiotic Note  Brent [name] is a 75 y.o. male admitted on 10/31/2020 with  speech disturbance and altered mental status .  Pharmacy has been consulted for vancomycin and cefepime dosing for possible HCAP.  Patient with tmax of 100.3 in ED, wbc elevated at 29, scr elevated at 2.5, which appears above his baseline.   Vancomycin 1722m IV Q 48 hrs. Goal AUC 400-550. Expected AUC: 525 SCr used: 2.5   Plan: Vancomycin 1750 IV every 48 hours.  Goal trough 15-20 mcg/mL. Cefepime 2g  q24 hours  Height: 6' 3.98" (193 cm) Weight: 76.2 kg (167 lb 15.9 oz) IBW/kg (Calculated) : 86.76  Temp (24hrs), Avg:100.3 F (37.9 C), Min:100.3 F (37.9 C), Max:100.3 F (37.9 C)  Recent Labs  Lab 10/26/20 1232 10/31/20 1945 10/31/20 2021 10/31/20 2117  WBC  --  29.1*  --   --   CREATININE 2.07* 2.45* 2.50*  --   LATICACIDVEN  --   --   --  1.5    Estimated Creatinine Clearance: 27.5 mL/min (A) (by C-G formula based on SCr of 2.5 mg/dL (H)).    Allergies  Allergen Reactions   Azathioprine Other (See Comments)    REACTION: affected WBC "Almost died"   Ciprofloxacin Other (See Comments)    Reaction not recalled   Levaquin [Levofloxacin In D5w] Other (See Comments)    Reaction not rec   Plendil [Felodipine] Other (See Comments)    Reaction not not recalled  Thank you for allowing pharmacy to be a part of this patient's care.  FErin HearingPharmD., BCPS Clinical Pharmacist 10/31/2020 11:56 PM

## 2020-10-31 NOTE — ED Notes (Signed)
[facility] home nurse would asked for an update. [phone]

## 2020-10-31 NOTE — Code Documentation (Signed)
Stroke Response Nurse Documentation Code Documentation  Brent [name] is a 75 y.o. male arriving to [facility] ED via [facility] on 9/7 with past medical hx of HTN, crohns dz, CKD, CHF. On Eliquis (apixaban) daily. Pt stated he took his Eliquis 9/6 in am. Code stroke was activated by EMS.   Patient from home where he was LKW at 1800 and now complaining of speech disturbance and altered mental status.   Stroke team at the bedside on patient arrival. Labs drawn and patient cleared for CT by Dr. Langston Masker. Patient to CT with team. NIHSS 0, see documentation for details and code stroke times. Patient with no deficits on exam. The following imaging was completed:  CT, CTA head and neck. Patient is not a candidate for IV Thrombolytic due to Eliquis yesterday.  Care/Plan: TIA alert.   Bedside handoff with ED RN Nicole Kindred.    Madelynn Done  Rapid Response RN

## 2020-11-01 ENCOUNTER — Encounter (HOSPITAL_COMMUNITY): Payer: Self-pay | Admitting: Internal Medicine

## 2020-11-01 ENCOUNTER — Encounter (HOSPITAL_COMMUNITY): Payer: Self-pay | Admitting: Nephrology

## 2020-11-01 ENCOUNTER — Observation Stay (HOSPITAL_COMMUNITY): Payer: Medicare Other

## 2020-11-01 DIAGNOSIS — R6510 Systemic inflammatory response syndrome (SIRS) of non-infectious origin without acute organ dysfunction: Secondary | ICD-10-CM

## 2020-11-01 DIAGNOSIS — N281 Cyst of kidney, acquired: Secondary | ICD-10-CM | POA: Diagnosis not present

## 2020-11-01 DIAGNOSIS — R197 Diarrhea, unspecified: Secondary | ICD-10-CM | POA: Diagnosis not present

## 2020-11-01 DIAGNOSIS — A419 Sepsis, unspecified organism: Secondary | ICD-10-CM | POA: Diagnosis not present

## 2020-11-01 DIAGNOSIS — D3500 Benign neoplasm of unspecified adrenal gland: Secondary | ICD-10-CM | POA: Diagnosis not present

## 2020-11-01 LAB — GASTROINTESTINAL PANEL BY PCR, STOOL (REPLACES STOOL CULTURE)

## 2020-11-01 LAB — CBC WITH DIFFERENTIAL/PLATELET
Abs Immature Granulocytes: 0 10*3/uL (ref 0.00–0.07)
Basophils Absolute: 0.3 10*3/uL — ABNORMAL HIGH (ref 0.0–0.1)
Basophils Relative: 1 %
Eosinophils Absolute: 0 10*3/uL (ref 0.0–0.5)
Eosinophils Relative: 0 %
HCT: 31.1 % — ABNORMAL LOW (ref 39.0–52.0)
Hemoglobin: 9.7 g/dL — ABNORMAL LOW (ref 13.0–17.0)
Lymphocytes Relative: 3 %
Lymphs Abs: 0.9 10*3/uL (ref 0.7–4.0)
MCH: 34.5 pg — ABNORMAL HIGH (ref 26.0–34.0)
MCHC: 31.2 g/dL (ref 30.0–36.0)
MCV: 110.7 fL — ABNORMAL HIGH (ref 80.0–100.0)
Monocytes Absolute: 1.2 10*3/uL — ABNORMAL HIGH (ref 0.1–1.0)
Monocytes Relative: 4 %
Neutro Abs: 27.2 10*3/uL — ABNORMAL HIGH (ref 1.7–7.7)
Neutrophils Relative %: 92 %
Platelets: 141 10*3/uL — ABNORMAL LOW (ref 150–400)
RBC: 2.81 MIL/uL — ABNORMAL LOW (ref 4.22–5.81)
RDW: 14.7 % (ref 11.5–15.5)
WBC: 29.6 10*3/uL — ABNORMAL HIGH (ref 4.0–10.5)
nRBC: 0 % (ref 0.0–0.2)
nRBC: 0 /100 WBC

## 2020-11-01 LAB — BASIC METABOLIC PANEL
Anion gap: 10 (ref 5–15)
BUN: 27 mg/dL — ABNORMAL HIGH (ref 8–23)
CO2: 23 mmol/L (ref 22–32)
Calcium: 7.8 mg/dL — ABNORMAL LOW (ref 8.9–10.3)
Chloride: 107 mmol/L (ref 98–111)
Creatinine, Ser: 2.32 mg/dL — ABNORMAL HIGH (ref 0.61–1.24)
GFR, Estimated: 29 mL/min — ABNORMAL LOW (ref >60)
Glucose, Bld: 125 mg/dL — ABNORMAL HIGH (ref 70–99)
Potassium: 3.4 mmol/L — ABNORMAL LOW (ref 3.5–5.1)
Sodium: 140 mmol/L (ref 135–145)

## 2020-11-01 LAB — RAPID URINE DRUG SCREEN, HOSP PERFORMED
Amphetamines: NOT DETECTED
Barbiturates: NOT DETECTED
Benzodiazepines: NOT DETECTED
Cocaine: NOT DETECTED
Opiates: NOT DETECTED
Tetrahydrocannabinol: NOT DETECTED

## 2020-11-01 LAB — PROCALCITONIN: Procalcitonin: 0.6 ng/mL

## 2020-11-01 LAB — HEPATIC FUNCTION PANEL
ALT: 27 U/L (ref 0–44)
AST: 23 U/L (ref 15–41)
Albumin: 2.6 g/dL — ABNORMAL LOW (ref 3.5–5.0)
Alkaline Phosphatase: 80 U/L (ref 38–126)
Bilirubin, Direct: 0.2 mg/dL (ref 0.0–0.2)
Indirect Bilirubin: 0.6 mg/dL (ref 0.3–0.9)
Total Bilirubin: 0.8 mg/dL (ref 0.3–1.2)
Total Protein: 6.7 g/dL (ref 6.5–8.1)

## 2020-11-01 LAB — TSH: TSH: 1.183 u[IU]/mL (ref 0.350–4.500)

## 2020-11-01 LAB — C DIFFICILE QUICK SCREEN W PCR REFLEX
C Diff antigen: NEGATIVE
C Diff interpretation: NOT DETECTED
C Diff toxin: NEGATIVE

## 2020-11-01 MED ORDER — BUDESONIDE 3 MG PO CPEP
9.0000 mg | ORAL_CAPSULE | Freq: Every day | ORAL | Status: DC
  Administered 2020-11-01 – 2020-11-03 (×3): 9 mg via ORAL
  Filled 2020-11-01 (×4): qty 3

## 2020-11-01 MED ORDER — QUETIAPINE FUMARATE 50 MG PO TABS
150.0000 mg | ORAL_TABLET | Freq: Every day | ORAL | Status: DC
  Administered 2020-11-01 – 2020-11-02 (×2): 150 mg via ORAL
  Filled 2020-11-01: qty 1
  Filled 2020-11-01 (×2): qty 3

## 2020-11-01 MED ORDER — ENSURE ENLIVE PO LIQD
237.0000 mL | Freq: Two times a day (BID) | ORAL | Status: DC
  Administered 2020-11-02 (×2): 237 mL via ORAL
  Filled 2020-11-01: qty 237

## 2020-11-01 MED ORDER — VITAMIN B-12 1000 MCG PO TABS
1000.0000 ug | ORAL_TABLET | Freq: Every day | ORAL | Status: DC
  Administered 2020-11-01 – 2020-11-03 (×3): 1000 ug via ORAL
  Filled 2020-11-01 (×3): qty 1

## 2020-11-01 MED ORDER — CALCIUM CARBONATE 1250 (500 CA) MG PO TABS
1250.0000 mg | ORAL_TABLET | Freq: Every day | ORAL | Status: DC
  Administered 2020-11-01 – 2020-11-03 (×3): 1250 mg via ORAL
  Filled 2020-11-01 (×3): qty 1

## 2020-11-01 MED ORDER — MIRTAZAPINE 15 MG PO TABS
7.5000 mg | ORAL_TABLET | Freq: Every day | ORAL | Status: DC
  Administered 2020-11-01 – 2020-11-02 (×3): 7.5 mg via ORAL
  Filled 2020-11-01 (×4): qty 1

## 2020-11-01 MED ORDER — PANTOPRAZOLE SODIUM 40 MG PO TBEC
40.0000 mg | DELAYED_RELEASE_TABLET | Freq: Every day | ORAL | Status: DC
  Administered 2020-11-02 – 2020-11-03 (×2): 40 mg via ORAL
  Filled 2020-11-01 (×2): qty 1

## 2020-11-01 MED ORDER — TAMSULOSIN HCL 0.4 MG PO CAPS
0.4000 mg | ORAL_CAPSULE | Freq: Every day | ORAL | Status: DC
  Administered 2020-11-01 – 2020-11-03 (×3): 0.4 mg via ORAL
  Filled 2020-11-01 (×3): qty 1

## 2020-11-01 MED ORDER — MAGNESIUM OXIDE -MG SUPPLEMENT 400 (240 MG) MG PO TABS
400.0000 mg | ORAL_TABLET | Freq: Every day | ORAL | Status: DC
  Administered 2020-11-02 – 2020-11-03 (×2): 400 mg via ORAL
  Filled 2020-11-01 (×2): qty 1

## 2020-11-01 MED ORDER — FERROUS SULFATE 325 (65 FE) MG PO TABS
325.0000 mg | ORAL_TABLET | Freq: Two times a day (BID) | ORAL | Status: DC
  Administered 2020-11-01 – 2020-11-03 (×6): 325 mg via ORAL
  Filled 2020-11-01 (×6): qty 1

## 2020-11-01 MED ORDER — POTASSIUM CHLORIDE CRYS ER 10 MEQ PO TBCR
10.0000 meq | EXTENDED_RELEASE_TABLET | Freq: Once | ORAL | Status: AC
  Administered 2020-11-01: 10 meq via ORAL
  Filled 2020-11-01: qty 1

## 2020-11-01 MED ORDER — VITAMIN D 25 MCG (1000 UNIT) PO TABS
5000.0000 [IU] | ORAL_TABLET | Freq: Every day | ORAL | Status: DC
  Administered 2020-11-02 – 2020-11-03 (×2): 5000 [IU] via ORAL
  Filled 2020-11-01 (×2): qty 5

## 2020-11-01 MED ORDER — APIXABAN 5 MG PO TABS
5.0000 mg | ORAL_TABLET | Freq: Two times a day (BID) | ORAL | Status: DC
  Administered 2020-11-01 – 2020-11-03 (×5): 5 mg via ORAL
  Filled 2020-11-01 (×5): qty 1

## 2020-11-01 MED ORDER — QUETIAPINE FUMARATE 100 MG PO TABS
150.0000 mg | ORAL_TABLET | Freq: Every day | ORAL | Status: DC
  Filled 2020-11-01: qty 1.5

## 2020-11-01 MED ORDER — ACETAMINOPHEN 325 MG PO TABS: 650.0000 mg | ORAL_TABLET | Freq: Four times a day (QID) | ORAL | Status: DC | PRN

## 2020-11-01 MED ORDER — METOPROLOL SUCCINATE ER 25 MG PO TB24
25.0000 mg | ORAL_TABLET | Freq: Two times a day (BID) | ORAL | Status: DC
  Administered 2020-11-01 – 2020-11-03 (×6): 25 mg via ORAL
  Filled 2020-11-01 (×6): qty 1

## 2020-11-01 MED ORDER — ALLOPURINOL 100 MG PO TABS
200.0000 mg | ORAL_TABLET | Freq: Every day | ORAL | Status: DC
  Administered 2020-11-01 – 2020-11-03 (×3): 200 mg via ORAL
  Filled 2020-11-01 (×3): qty 2

## 2020-11-01 MED ORDER — HYDRALAZINE HCL 25 MG PO TABS: 25.0000 mg | ORAL_TABLET | Freq: Three times a day (TID) | ORAL | Status: DC | PRN

## 2020-11-01 MED ORDER — DARIFENACIN HYDROBROMIDE ER 7.5 MG PO TB24
7.5000 mg | ORAL_TABLET | Freq: Every day | ORAL | Status: DC
  Administered 2020-11-01 – 2020-11-03 (×3): 7.5 mg via ORAL
  Filled 2020-11-01 (×3): qty 1

## 2020-11-01 MED ORDER — VANCOMYCIN HCL 1500 MG/300ML IV SOLN
1500.0000 mg | INTRAVENOUS | Status: DC
  Administered 2020-11-03: 1500 mg via INTRAVENOUS
  Filled 2020-11-01: qty 300

## 2020-11-01 MED ORDER — ADULT MULTIVITAMIN W/MINERALS CH
1.0000 | ORAL_TABLET | Freq: Every day | ORAL | Status: DC
  Administered 2020-11-02 – 2020-11-03 (×2): 1 via ORAL
  Filled 2020-11-01 (×2): qty 1

## 2020-11-01 MED ORDER — ACETAMINOPHEN 650 MG RE SUPP: 650.0000 mg | Freq: Four times a day (QID) | RECTAL | Status: DC | PRN

## 2020-11-01 MED ORDER — SODIUM CHLORIDE 0.9 % IV SOLN: INTRAVENOUS | Status: AC

## 2020-11-01 MED ORDER — IVABRADINE HCL 5 MG PO TABS
5.0000 mg | ORAL_TABLET | Freq: Two times a day (BID) | ORAL | Status: DC
  Administered 2020-11-01 – 2020-11-03 (×6): 5 mg via ORAL
  Filled 2020-11-01 (×8): qty 1

## 2020-11-01 MED ORDER — ISOSORBIDE MONONITRATE ER 60 MG PO TB24
60.0000 mg | ORAL_TABLET | Freq: Every day | ORAL | Status: DC
  Administered 2020-11-02 – 2020-11-03 (×2): 60 mg via ORAL
  Filled 2020-11-01 (×2): qty 1

## 2020-11-01 MED ORDER — DIVALPROEX SODIUM ER 500 MG PO TB24
1500.0000 mg | ORAL_TABLET | Freq: Every day | ORAL | Status: DC
  Administered 2020-11-01 – 2020-11-03 (×3): 1500 mg via ORAL
  Filled 2020-11-01 (×4): qty 3

## 2020-11-01 NOTE — Progress Notes (Signed)
PROGRESS NOTE    Brent [name]  WJX:[id number] DOB: [birth year] DOA: 10/31/2020 PCP: Wardell Honour, MD    Chief Complaint  Patient presents with   Aphasia    Brief Narrative:   This is a no charge note as patient was seen and admitted earlier today by Dr. Hal Hope, chart, imaging, and labs were reviewed, patient was seen and examined.    Brent [name] is a 75 y.o. 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 he was just discharged from skilled nursing facility about a week ago and has been developing diarrhea more than usual which is watery and multiple episodes.  Denies any vomiting.  Patient states he also has been having productive cough over the last 1 week.   - In the ER patient had a fever of 100.3 F tachycardic and labs showing leukocytosis of 29,000.  Initial code stroke was called and neurology was consulted.  CT head unremarkable and CT angiogram of the head and neck did not show any large vessel obstruction..  Since patient's symptoms more like septic neurology recommended septic work-up and I will get MRI brain when possible with the ICD.  Patient was empirically started on antibiotics and IV fluids following which patient became more alert awake and patient stated that he got better after he got the fluids.  Labs do show worsening renal function of creatinine of 2.4 which is worsened from last month of 1.9.  COVID test is positive.  Patient was COVID-positive about a month ago.  UA does show features with small leukocyte esterase and 21-50 WBC count.  Chest x-ray shows vascular congestion.  Patient was diagnosed with COVID-19 infection a month ago.  His screening  COVID-19 remains positive on admission, no need to isolate.     Assessment & Plan:   Principal Problem:   SIRS (systemic inflammatory response syndrome) (HCC) Active Problems:   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HCC)   Chronic combined systolic and diastolic congestive heart failure (HCC)  SIRS  -Patient presented with fever, significant leukocytosis at 29K, source could not be identified yet, follow on blood cultures, he has pyuria, but no bacteriuria, he denies any urinary symptoms, reports cough, congestion, but no pneumonia on imaging, patient is immunocompromised given his immunosuppressive therapy for Crohn's disease, continue with broad-spectrum antibiotic pending further work-up.  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  -Continue to monitor closely and avoid nephrotoxic medications.   Acute encephalopathy at this time resolved.  -  Likely from possible infection source of which was unclear.  Neurologist recommended getting MRI brain once patient's ICD can be addressed.   COVID-19 infection which patient was positive even for last month.  Chest x-ray shows vascular congestion.  Patient is not hypoxic.  We will closely monitor.  History of chronic systolic and diastolic CHF last EF measured was 25 to 30%  -   We will continue patient's Imdur/hydralazine and Corlanor and beta-blockers. -Monitor volume status closely.  A FIB  - on Eliquis  Chronic macrocytic anemia on iron supplements and B12 supplements.  Chronic thrombocytopenia follow CBC.  History  of Crohn's disease on Entocort and Remicade. -He is following with the [facility]  -Patient reports diarrhea, no abdominal tenderness   DVT prophylaxis: Eliquis Code Status: Full Family Communication: None at bedside Disposition:   Status is: Observation  The patient will require care spanning > 2 midnights and should be moved  to inpatient because: IV treatments appropriate due to intensity of illness or inability to take PO  Dispo: The patient is from: Home              Anticipated d/c is to: Home              Patient currently is medically stable to d/c.   Difficult to place patient No       Consultants:  None   Subjective: Patient still reporting some diarrhea, no chest pain, no shortness of breath at this point  Objective: Vitals:   11/01/20 0700 11/01/20 0737 11/01/20 1038 11/01/20 1351  BP: (!) 165/102 (!) 168/92 (!) 156/72 (!) 163/93  Pulse:  90 86 88  Resp: (!) 21 (!) 22 20 (!) 21  Temp:   98 F (36.7 C)   TempSrc:   Oral   SpO2:  98% 98% 96%  Weight:      Height:       No intake or output data in the 24 hours ending 11/01/20 1407 Filed Weights   10/31/20 2033  Weight: 76.2 kg    Examination:  General exam: Appears calm and comfortable , brusing on the right face Respiratory system: Clear to auscultation. Respiratory effort normal. Cardiovascular system: S1 & S2 heard, RRR. No JVD, murmurs, rubs, gallops or clicks. No pedal edema. Gastrointestinal system: Abdomen is nondistended, soft and nontender. No organomegaly or masses felt. Normal bowel sounds heard. Central nervous system: Alert and oriented. No focal neurological deficits. Extremities: Symmetric 5 x 5 power. Skin: No rashes, lesions or ulcers Psychiatry: Judgement and insight appear normal. Mood & affect appropriate.     Data Reviewed: I have personally reviewed following labs and imaging studies  CBC: Recent Labs  Lab 10/31/20 1945 10/31/20 2021 11/01/20 0245  WBC 29.1*  --  29.6*  NEUTROABS 26.5*  --  27.2*  HGB 9.0* 9.9* 9.7*  HCT 28.6* 29.0* 31.1*  MCV 108.7*  --  110.7*  PLT 134*  --  141*    Basic Metabolic Panel: Recent Labs  Lab 10/26/20 1232 10/31/20 1945 10/31/20 2021 11/01/20 0245  NA 139 137 142 140  K 3.8 3.3* 3.4* 3.4*  CL 106 106 106 107  CO2 25 24  --  23  GLUCOSE 88 102* 100*  125*  BUN 33* 28* 28* 27*  CREATININE 2.07* 2.45* 2.50* 2.32*  CALCIUM 8.5* 7.7*  --  7.8*    GFR: Estimated Creatinine Clearance: 29.7 mL/min (A) (by C-G formula based on SCr of 2.32 mg/dL (H)).  Liver Function Tests: Recent Labs  Lab 10/31/20 1945 11/01/20 0245  AST 25 23  ALT 26 27  ALKPHOS 81 80  BILITOT 0.9 0.8  PROT 6.0* 6.7  ALBUMIN 2.5* 2.6*    CBG: Recent Labs  Lab 10/31/20 1948  GLUCAP 106*     Recent Results (from the past 240 hour(s))  Resp Panel by RT-PCR (Flu A&B, Covid) Nasopharyngeal Swab     Status: Abnormal   Collection Time: 10/31/20  7:46 PM   Specimen: Nasopharyngeal Swab; Nasopharyngeal(NP) swabs in vial transport medium  Result Value Ref Range Status   SARS Coronavirus 2 by RT PCR  POSITIVE (A) NEGATIVE Final    Comment: RESULT CALLED TO, READ BACK BY AND VERIFIED WITH: RN TONY SINES 10/31/20@21 :48 BY TW (NOTE) SARS-CoV-2 target nucleic acids are DETECTED.  The SARS-CoV-2 RNA is generally detectable in upper respiratory specimens during the acute phase of infection. Positive results are indicative of the presence of the identified virus, but do not rule out bacterial infection or co-infection with other pathogens not detected by the test. Clinical correlation with patient history and other diagnostic information is necessary to determine patient infection status. The expected result is Negative.  Fact Sheet for Patients: EntrepreneurPulse.com.au  Fact Sheet for Healthcare Providers: IncredibleEmployment.be  This test is not yet approved or cleared by the [place] FDA and  has been authorized for detection and/or diagnosis of SARS-CoV-2 by FDA under an Emergency Use Authorization (EUA).  This EUA will remain in effect (meaning this test can be  used) for the duration of  the COVID-19 declaration under Section 564(b)(1) of the Act, 21 U.S.C. section 360bbb-3(b)(1), unless the authorization  is terminated or revoked sooner.     Influenza A by PCR NEGATIVE NEGATIVE Final   Influenza B by PCR NEGATIVE NEGATIVE Final    Comment: (NOTE) The Xpert Xpress SARS-CoV-2/FLU/RSV plus assay is intended as an aid in the diagnosis of influenza from Nasopharyngeal swab specimens and should not be used as a sole basis for treatment. Nasal washings and aspirates are unacceptable for Xpert Xpress SARS-CoV-2/FLU/RSV testing.  Fact Sheet for Patients: EntrepreneurPulse.com.au  Fact Sheet for Healthcare Providers: IncredibleEmployment.be  This test is not yet approved or cleared by the [place] FDA and has been authorized for detection and/or diagnosis of SARS-CoV-2 by FDA under an Emergency Use Authorization (EUA). This EUA will remain in effect (meaning this test can be used) for the duration of the COVID-19 declaration under Section 564(b)(1) of the Act, 21 U.S.C. section 360bbb-3(b)(1), unless the authorization is terminated or revoked.  Performed at [facility], [place] [address] [place], [place] [address]   Blood culture (routine x 2)     Status: None (Preliminary result)   Collection Time: 10/31/20  9:12 PM   Specimen: BLOOD  Result Value Ref Range Status   Specimen Description BLOOD RIGHT ANTECUBITAL  Final   Special Requests   Final    BOTTLES DRAWN AEROBIC AND ANAEROBIC Blood Culture adequate volume   Culture   Final    NO GROWTH < 12 HOURS Performed at [facility], [place] [address] [place], [place] [address]    Report Status PENDING  Incomplete  Blood culture (routine x 2)     Status: None (Preliminary result)   Collection Time: 10/31/20  9:20 PM   Specimen: BLOOD RIGHT HAND  Result Value Ref Range Status   Specimen Description BLOOD RIGHT HAND  Final   Special Requests   Final    BOTTLES DRAWN AEROBIC AND ANAEROBIC Blood Culture adequate volume   Culture   Final    NO GROWTH < 12 HOURS Performed at [facility], [place] [address] [place],  [address]    Report Status PENDING  Incomplete  Gastrointestinal Panel by PCR , Stool     Status: None   Collection Time: 11/01/20  6:11 AM   Specimen: Stool  Result Value Ref Range Status   Campylobacter species NOT DETECTED NOT DETECTED Final   Plesimonas shigelloides NOT DETECTED NOT DETECTED Final   Salmonella species NOT DETECTED NOT DETECTED Final   Yersinia  enterocolitica NOT DETECTED NOT DETECTED Final   Vibrio species NOT DETECTED NOT DETECTED Final   Vibrio cholerae NOT DETECTED NOT DETECTED Final   Enteroaggregative E coli (EAEC) NOT DETECTED NOT DETECTED Final   Enteropathogenic E coli (EPEC) NOT DETECTED NOT DETECTED Final   Enterotoxigenic E coli (ETEC) NOT DETECTED NOT DETECTED Final   Shiga like toxin producing E coli (STEC) NOT DETECTED NOT DETECTED Final   Shigella/Enteroinvasive E coli (EIEC) NOT DETECTED NOT DETECTED Final   Cryptosporidium NOT DETECTED NOT DETECTED Final   Cyclospora cayetanensis NOT DETECTED NOT DETECTED Final   Entamoeba histolytica NOT DETECTED NOT DETECTED Final   Giardia lamblia NOT DETECTED NOT DETECTED Final   Adenovirus F40/41 NOT DETECTED NOT DETECTED Final   Astrovirus NOT DETECTED NOT DETECTED Final   Norovirus GI/GII NOT DETECTED NOT DETECTED Final   Rotavirus A NOT DETECTED NOT DETECTED Final   Sapovirus (I, II, IV, and V) NOT DETECTED NOT DETECTED Final    Comment: Performed at [facility], [place] [place], [place] [address]  C Difficile Quick Screen w PCR reflex     Status: None   Collection Time: 11/01/20  6:11 AM   Specimen: Stool  Result Value Ref Range Status   C Diff antigen NEGATIVE NEGATIVE Final   C Diff toxin NEGATIVE NEGATIVE Final   C Diff interpretation No C. difficile detected.  Final    Comment: Performed at [facility], [place] [address] [place], [place] [address]         Radiology Studies: CT ABDOMEN PELVIS WO CONTRAST  Result Date:  11/01/2020 CLINICAL DATA:  Diarrhea. Crohn's disease. Prior bowel resections. Sepsis. EXAM: CT ABDOMEN AND PELVIS WITHOUT CONTRAST TECHNIQUE: Multidetector CT imaging of the abdomen and pelvis was performed following the standard protocol without IV contrast. COMPARISON:  Multiple exams, including 05/31/2017 FINDINGS: Lower chest: Defibrillator lead terminates the right ventricular apex. Trace anterior pericardial effusion. Borderline cardiomegaly. Trace bilateral pleural effusions. Mild dependent subsegmental atelectasis in both lower lobes. Hepatobiliary: Cholecystectomy. Common bile duct approximately 1 cm in diameter, similar to prior exams, with prominence likely representing physiologic response to cholecystectomy. Pancreas: Unremarkable Spleen: Unremarkable Adrenals/Urinary Tract: 1.2 by 1.5 cm left adrenal nodule on image 27 series 3, currently 12 Hounsfield units which is indeterminate, but shown to be an adrenal adenoma on prior abdominal MRI of 05/05/2008. Right adrenal gland unremarkable. Scattered hypodense hepatic lesions are likely cysts, as characterized on multiple prior exams including the renal ultrasound of 03/23/2020. IV contrast was not administered on today's exam and accordingly the enhancement characteristics of these lesions are not interrogated. There is leftover contrast in the renal collecting systems and ureters bilaterally, likely related to the CT angiography exam performed yesterday. No hydronephrosis or hydroureter. Contrast medium is present in the urinary bladder. There is a right posterior bladder diverticulum. Because of the presence of the collecting system contrast, sensitivity for small nonobstructive renal calculi is low. However, calculi have not been shown on prior exams. There is some subtle indistinctness of the bladder adventitious/serosa which could reflect cystitis. Stomach/Bowel: Air-levels in the sigmoid colon and rectum suggesting diarrheal process. Suspected wall  thickening in the lower rectum with some mild presacral stranding, suspicious for inflammation, although strictly speaking rectal tumor is not excluded. Prior partial right colectomy. No dilated small bowel. No extraluminal gas identified. Vascular/Lymphatic: Atherosclerosis is present, including aortoiliac atherosclerotic disease. Reproductive: Unremarkable Other: No supplemental non-categorized findings. Musculoskeletal: Lumbar spondylosis and degenerative disc disease with grade 1 degenerative  anterolisthesis at L4-5. This causes mild to moderate right foraminal stenosis at L4-5 and mild left foraminal stenosis at L3-4, L4-5, L5-S1. IMPRESSION: 1. Distal colonic air-levels indicating diarrheal process. 2. Wall thickening in the lower rectum, with some mild perirectal stranding and presacral stranding. The appearance favors inflammation in the context of the patient's history. Lower rectal tumor is a less likely differential diagnostic consideration. 3. Please note that today's exam was performed without IV contrast, and accordingly some signs of active Crohn's disease such as mucosal enhancement might not be readily apparent. 4. Mild stranding around the margins of the urinary bladder which could reflect cystitis, correlate with urine analysis. No gas is evident in the urinary bladder. 5. Other imaging findings of potential clinical significance: Trace anterior pericardial effusion. Trace bilateral pleural effusions. Stable left adrenal adenoma. Bilateral renal cysts. Aortic Atherosclerosis (ICD10-I70.0). Multilevel lumbar impingement. Electronically Signed   By: Van Clines M.D.   On: 11/01/2020 10:58   DG Chest Portable 1 View  Result Date: 10/31/2020 CLINICAL DATA:  Confusion, slurred speech EXAM: PORTABLE CHEST 1 VIEW COMPARISON:  10/08/2020 FINDINGS: Left AICD remains in place, unchanged. Cardiomegaly, vascular congestion. No confluent opacities, effusions or overt edema. IMPRESSION: Cardiomegaly,  vascular congestion. Electronically Signed   By: Rolm Baptise M.D.   On: 10/31/2020 20:39   CT HEAD CODE STROKE WO CONTRAST  Result Date: 10/31/2020 CLINICAL DATA:  Code stroke.  Neuro deficit, acute, stroke suspected EXAM: CT HEAD WITHOUT CONTRAST TECHNIQUE: Contiguous axial images were obtained from the base of the skull through the vertex without intravenous contrast. COMPARISON:  July 2020 FINDINGS: Brain: There is no acute intracranial hemorrhage, mass effect, or edema. No new loss of gray-white differentiation. Prominence of the ventricles and sulci reflects parenchymal volume loss with mild progression since 2020. Patchy hypoattenuation in the supratentorial white matter is nonspecific but probably reflects mild to moderate chronic microvascular ischemic changes. No extra-axial collection. Vascular: No hyperdense vessel. Skull: Unremarkable. Sinuses/Orbits: Paranasal sinus mucosal thickening. No significant orbital abnormality. Other: Mastoid air cells are clear. ASPECTS ([place] Stroke Program Early CT Score) - Ganglionic level infarction (caudate, lentiform nuclei, internal capsule, insula, M1-M3 cortex): 7 - Supraganglionic infarction (M4-M6 cortex): 3 Total score (0-10 with 10 being normal): 10 IMPRESSION: There is no acute intracranial hemorrhage or evidence of acute infarction. ASPECT score is 10. Chronic microvascular ischemic changes. These results were communicated to Dr. Rory Percy at 8:00 pm on 10/31/2020 by text page via the [facility] messaging system. Electronically Signed   By: Macy Mis M.D.   On: 10/31/2020 20:03   CT ANGIO HEAD NECK W WO CM (CODE STROKE)  Result Date: 10/31/2020 CLINICAL DATA:  Neuro deficit, acute, stroke suspected; abnormal speech EXAM: CT ANGIOGRAPHY HEAD AND NECK TECHNIQUE: Multidetector CT imaging of the head and neck was performed using the standard protocol during bolus administration of intravenous contrast. Multiplanar CT image reconstructions and MIPs were obtained  to evaluate the vascular anatomy. Carotid stenosis measurements (when applicable) are obtained utilizing NASCET criteria, using the distal internal carotid diameter as the denominator. CONTRAST:  42m OMNIPAQUE IOHEXOL 350 MG/ML SOLN COMPARISON:  None. FINDINGS: CTA NECK Aortic arch: Great vessel origins are patent. There is direct origin of the left vertebral artery from the arch. Right carotid system: Patent. Trace calcified plaque along the proximal internal carotid without stenosis. Left carotid system: Patent. Mild mixed plaque along the proximal internal carotid with minimal stenosis. Partially retropharyngeal course. Vertebral arteries: Patent. Right vertebral artery slightly dominant. No stenosis. Skeleton: Degenerative  changes of the cervical spine. Other neck: Unremarkable. Upper chest: No apical lung mass. Review of the MIP images confirms the above findings CTA HEAD Anterior circulation: The intracranial internal carotid arteries are patent. Anterior and middle cerebral arteries are patent. Posterior circulation: Intracranial vertebral arteries are patent. Basilar artery is patent. Major cerebral artery origins are patent. The vertebrobasilar system is small in caliber secondary to bilateral posterior communicating arteries being the primary supply of the patent posterior cerebral arteries. Venous sinuses: Patent as allowed by contrast bolus timing. Review of the MIP images confirms the above findings IMPRESSION: No large vessel occlusion or hemodynamically significant stenosis. Electronically Signed   By: Macy Mis M.D.   On: 10/31/2020 20:27        Scheduled Meds:  allopurinol  200 mg Oral Daily   apixaban  5 mg Oral BID   budesonide  9 mg Oral Daily   calcium carbonate  1,250 mg Oral Q breakfast   cholecalciferol  5,000 Units Oral Daily   darifenacin  7.5 mg Oral Daily   divalproex  1,500 mg Oral QHS   feeding supplement  237 mL Oral BID BM   ferrous sulfate  325 mg Oral BID WC    isosorbide mononitrate  60 mg Oral Daily   ivabradine  5 mg Oral BID WC   magnesium oxide  400 mg Oral Daily   metoprolol succinate  25 mg Oral BID   mirtazapine  7.5 mg Oral QHS   multivitamin with minerals  1 tablet Oral Daily   pantoprazole  40 mg Oral Daily   QUEtiapine  150 mg Oral QHS   tamsulosin  0.4 mg Oral Daily   vitamin B-12  1,000 mcg Oral Daily   Continuous Infusions:  sodium chloride 75 mL/hr at 11/01/20 0307   ceFEPime (MAXIPIME) IV Stopped (11/01/20 0049)   [START ON 11/03/2020] vancomycin       LOS: 0 days      Phillips Climes, MD [facility]   To contact the attending provider between 7A-7P or the covering provider during after hours 7P-7A, please log into the web site [email or web address] and access using universal [place] password for that web site. If you do not have the password, please call the hospital operator.  11/01/2020, 2:07 PM

## 2020-11-01 NOTE — H&P (Signed)
History and Physical    Brent [name] HDQ:[id number] DOB: 02-23-1946 DOA: 10/31/2020  PCP: Wardell Honour, MD  Patient coming from: Home.  Chief Complaint: Confusion and slurred speech.  HPI: Brent [name] is a 75 y.o. 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  Patient was in the car trying to drive without the keys.  Patient was brought to the ER.  By the time I examined the patient patient was alert and awake.  Patient stated that he suddenly became confused he was doing fine the day prior.  Patient states he was just discharged from skilled nursing facility about a week ago and has been developing diarrhea more than usual which is watery and multiple episodes.  Denies any vomiting.  Patient states he also has been having productive cough over the last 1 week.  Patient was diagnosed with COVID-19 infection a month ago.  ED Course: In the ER patient had a fever of 100.3 F tachycardic and labs showing leukocytosis of 29,000.  Initial code stroke was called and neurology was consulted.  CT head unremarkable and CT angiogram of the head and neck did not show any large vessel obstruction..  Since patient's symptoms more like septic neurology recommended septic work-up and I will get MRI brain when possible with the ICD.  Patient was empirically started on antibiotics and IV fluids following which patient became more alert awake and patient stated that he got better after he got the fluids.  Labs do show worsening renal function of creatinine of 2.4 which is worsened from last month of 1.9.  COVID test is positive.  Patient was COVID-positive about a month ago.  UA does show features with  small leukocyte esterase and 21-50 WBC count.  Chest x-ray shows vascular congestion.  Review of Systems: As per HPI, rest all negative.   Past Medical History:  Diagnosis Date   Anemia    Anxiety    Benign paroxysmal positional vertigo    Bipolar I disorder, most recent episode (or current) unspecified    Celiac disease    CHF (congestive heart failure) (HCC)    Chronic kidney disease, stage III (moderate) (HCC)    Crohn's    Remicade q8 weeks   Depression    Essential and other specified forms of tremor    medication-induced Parkinson's, now resolved   Essential hypertension, benign    Gout 2018   Heart failure ([place])    Hypertrophy of prostate without urinary obstruction and other lower urinary tract symptoms (LUTS)    Impotence of organic origin    Insomnia with sleep apnea, unspecified    Iron deficiency anemia, unspecified    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   Neuralgia, neuritis, and radiculitis, unspecified    Other B-complex deficiencies    Other extrapyramidal disease and abnormal movement disorder    Postinflammatory pulmonary fibrosis ([place])    Tobacco use disorder    Vertigo 2018    Past Surgical History:  Procedure Laterality Date   CATARACT EXTRACTION, BILATERAL Bilateral 09/2018   CHOLECYSTECTOMY  07-12-2010   ICD IMPLANT N/A 01/02/2020   Procedure: ICD IMPLANT;  Surgeon: Evans Lance, MD;  Location: [facility];  Service: Cardiovascular;  Laterality: N/A;   RIGHT HEART CATH N/A 10/12/2018   Procedure: RIGHT HEART CATH;  Surgeon: Jolaine Artist, MD;  Location: [facility];  Service: Cardiovascular;  Laterality: N/A;   RIGHT/LEFT HEART CATH AND CORONARY ANGIOGRAPHY N/A 04/26/2019   Procedure: RIGHT/LEFT HEART CATH AND CORONARY ANGIOGRAPHY;  Surgeon: Larey Dresser, MD;  Location: [facility];  Service: Cardiovascular;  Laterality: N/A;   SMALL INTESTINE SURGERY     x 2     reports that he quit smoking about 6 years ago. His smoking use  included cigarettes. He started smoking about 64 years ago. He has a 49.00 pack-year smoking history. He has never used smokeless tobacco. He reports that he does not drink alcohol and does not use drugs.  Allergies  Allergen Reactions   Azathioprine Other (See Comments)    REACTION: affected WBC "Almost died"   Ciprofloxacin Other (See Comments)    Reaction not recalled   Levaquin [Levofloxacin In D5w] Other (See Comments)    Reaction not rec   Plendil [Felodipine] Other (See Comments)    Reaction not not recalled    Family History  Problem Relation Age of Onset   Diabetes Mother        maternal grandmother   Uterine cancer Mother    Emphysema Father    Pneumonia Maternal Grandmother    Colon cancer Neg Hx     Prior to Admission medications   Medication Sig Start Date End Date Taking? Authorizing Provider  acetaminophen (TYLENOL) 160 MG/5ML liquid Take 10.2 mLs (325 mg total) by mouth every 6 (six) hours as needed for fever or pain. 10/15/20   Hongalgi, Lenis Dickinson, MD  allopurinol (ZYLOPRIM) 100 MG tablet Take 2 tablets (200 mg total) by mouth daily. 10/28/20   Medina-Vargas, Monina C, NP  apixaban (ELIQUIS) 5 MG TABS tablet Take 1 tablet (5 mg total) by mouth 2 (two) times daily. 10/28/20 11/27/20  Medina-Vargas, Monina C, NP  budesonide (ENTOCORT EC) 3 MG 24 hr capsule Take 3 capsules (9 mg total) by mouth daily. 10/28/20   Medina-Vargas, Monina C, NP  Calcium 500 MG tablet Take 1 tablet (500 mg total) by mouth 1 day or 1 dose for 1 dose. 05/19/20 12/01/20  Geradine Girt, DO  Cholecalciferol (VITAMIN D) 125 MCG (5000 UT) CAPS Take 5,000 Units by mouth daily. 10/28/20   Medina-Vargas, Monina C, NP  dapagliflozin propanediol (FARXIGA) 10 MG TABS tablet Take 1 tablet (10 mg total) by mouth daily before breakfast. 10/28/20   Medina-Vargas, Monina C, NP  divalproex (DEPAKOTE ER) 500 MG 24 hr tablet Take 3 tablets (1,500 mg total) by mouth at bedtime. 10/28/20   Medina-Vargas, Monina C, NP  feeding  supplement (ENSURE ENLIVE / ENSURE PLUS) LIQD Take 237 mLs by mouth 2 (two) times daily between meals. 10/15/20   Hongalgi, Lenis Dickinson, MD  ferrous sulfate 325 (65 FE) MG tablet Take 1 tablet (325 mg total) by mouth in the morning and at bedtime. 10/28/20   Medina-Vargas, Monina C, NP  furosemide (LASIX) 20 MG tablet Take 1 tablet (20 mg total) by mouth daily. 10/28/20   Medina-Vargas, Monina C, NP  guaiFENesin (ROBITUSSIN) 100 MG/5ML SOLN Take 10 mLs (200 mg total) by mouth 3 (three) times daily for 14 days. 10/28/20 11/11/20  Medina-Vargas, Monina C, NP  hydrALAZINE (APRESOLINE) 25 MG tablet Take 1 tablet (25 mg total) by mouth 3 (three) times daily as needed (High BP, SBP>130). 10/28/20  Medina-Vargas, Monina C, NP  inFLIXimab (REMICADE) 100 MG injection Infuse Remicade IV schedule 1 85m/kg every 8 weeks Premedicate with Tylenol 500-6540mby mouth and Benadryl 25-5067my mouth prior to infusion. Last PPD was on 12/2009.  11/22/10   JacMilus BanisterD  isosorbide mononitrate (IMDUR) 60 MG 24 hr tablet Take 1 tablet (60 mg total) by mouth daily. 10/28/20   Medina-Vargas, Monina C, NP  ivabradine (CORLANOR) 5 MG TABS tablet TAKE 1 TABLET(5 MG) BY MOUTH TWICE DAILY WITH A MEAL 10/28/20   Medina-Vargas, Monina C, NP  loperamide (IMODIUM) 2 MG capsule Take 1 capsule (2 mg total) by mouth 3 (three) times daily as needed for diarrhea or loose stools. 10/28/20   Medina-Vargas, Monina C, NP  Magnesium 400 MG TABS Take 400 mg by mouth daily. 10/28/20   Medina-Vargas, Monina C, NP  metoprolol succinate (TOPROL-XL) 25 MG 24 hr tablet Take 1 tablet (25 mg total) by mouth in the morning and at bedtime. 10/28/20   Medina-Vargas, Monina C, NP  mirtazapine (REMERON) 7.5 MG tablet Take 1 tablet (7.5 mg total) by mouth at bedtime. TAKE 1 TABLET(7.5 MG) BY MOUTH AT BEDTIME 10/28/20   Medina-Vargas, Monina C, NP  Multiple Vitamin (MULTIVITAMIN WITH MINERALS) TABS tablet Take 1 tablet by mouth daily. 10/16/20   Hongalgi, AnaLenis DickinsonD   Nutritional Supplements (NUTRITIONAL SUPPLEMENT PO) Take by mouth daily. Magic Cup    [provider]  omeprazole (PRILOSEC) 20 MG capsule Take 1 capsule (20 mg total) by mouth daily. 10/28/20   Medina-Vargas, Monina C, NP  polyethylene glycol (MIRALAX / GLYCOLAX) 17 g packet Take 17 g by mouth daily as needed. Give Miralax with 6 oz of water by mouth as needed for constipation 10/28/20   Medina-Vargas, Monina C, NP  QUEtiapine (SEROQUEL) 100 MG tablet Take 1.5 tablets (150 mg total) by mouth at bedtime. 10/28/20   Medina-Vargas, Monina C, NP  solifenacin (VESICARE) 5 MG tablet Take 1 tablet (5 mg total) by mouth daily. 10/28/20   Medina-Vargas, Monina C, NP  tamsulosin (FLOMAX) 0.4 MG CAPS capsule Take 1 capsule (0.4 mg total) by mouth daily. 10/28/20   Medina-Vargas, Monina C, NP  vitamin B-12 (CYANOCOBALAMIN) 500 MCG tablet Take 1,000 mcg by mouth daily.     [provider]    Physical Exam: Constitutional: Moderately built and nourished. Vitals:   11/01/20 0045 11/01/20 0100 11/01/20 0225 11/01/20 0230  BP: 137/88 130/83 (!) 160/103 (!) 152/98  Pulse: 96 92 (!) 122 (!) 102  Resp: 20 18 (!) 22 19  Temp:      TempSrc:      SpO2: 95% 94% 96% 97%  Weight:      Height:       Eyes: Anicteric no pallor. ENMT: No discharge from the ears eyes nose and mouth. Neck: No mass felt.  No neck rigidity. Respiratory: No rhonchi or crepitations. Cardiovascular: S1-S2 heard. Abdomen: Soft nontender bowel sound present. Musculoskeletal: No edema. Skin: No rash. Neurologic: Alert awake oriented time place and person.  Moving all extremities 5 x 5. Psychiatric: Appears normal.  Normal affect.   Labs on Admission: I have personally reviewed following labs and imaging studies  CBC: Recent Labs  Lab 10/31/20 1945 10/31/20 2021  WBC 29.1*  --   NEUTROABS 26.5*  --   HGB 9.0* 9.9*  HCT 28.6* 29.0*  MCV 108.7*  --   PLT 134*  --    Basic Metabolic Panel: Recent Labs  Lab  10/26/20  1232 10/31/20 1945 10/31/20 2021  NA 139 137 142  K 3.8 3.3* 3.4*  CL 106 106 106  CO2 25 24  --   GLUCOSE 88 102* 100*  BUN 33* 28* 28*  CREATININE 2.07* 2.45* 2.50*  CALCIUM 8.5* 7.7*  --    GFR: Estimated Creatinine Clearance: 27.5 mL/min (A) (by C-G formula based on SCr of 2.5 mg/dL (H)). Liver Function Tests: Recent Labs  Lab 10/31/20 1945  AST 25  ALT 26  ALKPHOS 81  BILITOT 0.9  PROT 6.0*  ALBUMIN 2.5*   No results for input(s): LIPASE, AMYLASE in the last 168 hours. Recent Labs  Lab 10/31/20 2004  AMMONIA <9*   Coagulation Profile: Recent Labs  Lab 10/31/20 1945  INR 1.4*   Cardiac Enzymes: No results for input(s): CKTOTAL, CKMB, CKMBINDEX, TROPONINI in the last 168 hours. BNP (last 3 results) No results for input(s): PROBNP in the last 8760 hours. HbA1C: No results for input(s): HGBA1C in the last 72 hours. CBG: Recent Labs  Lab 10/31/20 1948  GLUCAP 106*   Lipid Profile: No results for input(s): CHOL, HDL, LDLCALC, TRIG, CHOLHDL, LDLDIRECT in the last 72 hours. Thyroid Function Tests: No results for input(s): TSH, T4TOTAL, FREET4, T3FREE, THYROIDAB in the last 72 hours. Anemia Panel: Recent Labs    10/31/20 2117  VITAMINB12 778   Urine analysis:    Component Value Date/Time   COLORURINE YELLOW 10/31/2020 2004   APPEARANCEUR HAZY (A) 10/31/2020 2004   LABSPEC 1.038 (H) 10/31/2020 2004   PHURINE 5.0 10/31/2020 2004   GLUCOSEU NEGATIVE 10/31/2020 2004   HGBUR NEGATIVE 10/31/2020 2004   BILIRUBINUR NEGATIVE 10/31/2020 2004   BILIRUBINUR Positive 04/26/2020 [place] NEGATIVE 10/31/2020 2004   PROTEINUR 30 (A) 10/31/2020 2004   UROBILINOGEN negative (A) 04/26/2020 1504   UROBILINOGEN 0.2 07/10/2010 1709   NITRITE NEGATIVE 10/31/2020 2004   LEUKOCYTESUR SMALL (A) 10/31/2020 2004   Sepsis Labs: @LABRCNTIP (procalcitonin:4,lacticidven:4) ) Recent Results (from the past 240 hour(s))  Resp Panel by RT-PCR (Flu A&B,  Covid) Nasopharyngeal Swab     Status: Abnormal   Collection Time: 10/31/20  7:46 PM   Specimen: Nasopharyngeal Swab; Nasopharyngeal(NP) swabs in vial transport medium  Result Value Ref Range Status   SARS Coronavirus 2 by RT PCR POSITIVE (A) NEGATIVE Final    Comment: RESULT CALLED TO, READ BACK BY AND VERIFIED WITH: RN TONY SINES 10/31/20@21 :48 BY TW (NOTE) SARS-CoV-2 target nucleic acids are DETECTED.  The SARS-CoV-2 RNA is generally detectable in upper respiratory specimens during the acute phase of infection. Positive results are indicative of the presence of the identified virus, but do not rule out bacterial infection or co-infection with other pathogens not detected by the test. Clinical correlation with patient history and other diagnostic information is necessary to determine patient infection status. The expected result is Negative.  Fact Sheet for Patients: EntrepreneurPulse.com.au  Fact Sheet for Healthcare Providers: IncredibleEmployment.be  This test is not yet approved or cleared by the [place] FDA and  has been authorized for detection and/or diagnosis of SARS-CoV-2 by FDA under an Emergency Use Authorization (EUA).  This EUA will remain in effect (meaning this test can be  used) for the duration of  the COVID-19 declaration under Section 564(b)(1) of the Act, 21 U.S.C. section 360bbb-3(b)(1), unless the authorization is terminated or revoked sooner.     Influenza A by PCR NEGATIVE NEGATIVE Final   Influenza B by PCR NEGATIVE NEGATIVE Final    Comment: (NOTE) The  Xpert Xpress SARS-CoV-2/FLU/RSV plus assay is intended as an aid in the diagnosis of influenza from Nasopharyngeal swab specimens and should not be used as a sole basis for treatment. Nasal washings and aspirates are unacceptable for Xpert Xpress SARS-CoV-2/FLU/RSV testing.  Fact Sheet for Patients: EntrepreneurPulse.com.au  Fact Sheet for  Healthcare Providers: IncredibleEmployment.be  This test is not yet approved or cleared by the [place] FDA and has been authorized for detection and/or diagnosis of SARS-CoV-2 by FDA under an Emergency Use Authorization (EUA). This EUA will remain in effect (meaning this test can be used) for the duration of the COVID-19 declaration under Section 564(b)(1) of the Act, 21 U.S.C. section 360bbb-3(b)(1), unless the authorization is terminated or revoked.  Performed at [facility], [place] [address], [place], [place] [address]      Radiological Exams on Admission: DG Chest Portable 1 View  Result Date: 10/31/2020 CLINICAL DATA:  Confusion, slurred speech EXAM: PORTABLE CHEST 1 VIEW COMPARISON:  10/08/2020 FINDINGS: Left AICD remains in place, unchanged. Cardiomegaly, vascular congestion. No confluent opacities, effusions or overt edema. IMPRESSION: Cardiomegaly, vascular congestion. Electronically Signed   By: Rolm Baptise M.D.   On: 10/31/2020 20:39   CT HEAD CODE STROKE WO CONTRAST  Result Date: 10/31/2020 CLINICAL DATA:  Code stroke.  Neuro deficit, acute, stroke suspected EXAM: CT HEAD WITHOUT CONTRAST TECHNIQUE: Contiguous axial images were obtained from the base of the skull through the vertex without intravenous contrast. COMPARISON:  July 2020 FINDINGS: Brain: There is no acute intracranial hemorrhage, mass effect, or edema. No new loss of gray-white differentiation. Prominence of the ventricles and sulci reflects parenchymal volume loss with mild progression since 2020. Patchy hypoattenuation in the supratentorial white matter is nonspecific but probably reflects mild to moderate chronic microvascular ischemic changes. No extra-axial collection. Vascular: No hyperdense vessel. Skull: Unremarkable. Sinuses/Orbits: Paranasal sinus mucosal thickening. No significant orbital abnormality. Other: Mastoid air cells are clear. ASPECTS ([place] Stroke Program Early CT  Score) - Ganglionic level infarction (caudate, lentiform nuclei, internal capsule, insula, M1-M3 cortex): 7 - Supraganglionic infarction (M4-M6 cortex): 3 Total score (0-10 with 10 being normal): 10 IMPRESSION: There is no acute intracranial hemorrhage or evidence of acute infarction. ASPECT score is 10. Chronic microvascular ischemic changes. These results were communicated to Dr. Rory Percy at 8:00 pm on 10/31/2020 by text page via the [facility] messaging system. Electronically Signed   By: Macy Mis M.D.   On: 10/31/2020 20:03   CT ANGIO HEAD NECK W WO CM (CODE STROKE)  Result Date: 10/31/2020 CLINICAL DATA:  Neuro deficit, acute, stroke suspected; abnormal speech EXAM: CT ANGIOGRAPHY HEAD AND NECK TECHNIQUE: Multidetector CT imaging of the head and neck was performed using the standard protocol during bolus administration of intravenous contrast. Multiplanar CT image reconstructions and MIPs were obtained to evaluate the vascular anatomy. Carotid stenosis measurements (when applicable) are obtained utilizing NASCET criteria, using the distal internal carotid diameter as the denominator. CONTRAST:  67m OMNIPAQUE IOHEXOL 350 MG/ML SOLN COMPARISON:  None. FINDINGS: CTA NECK Aortic arch: Great vessel origins are patent. There is direct origin of the left vertebral artery from the arch. Right carotid system: Patent. Trace calcified plaque along the proximal internal carotid without stenosis. Left carotid system: Patent. Mild mixed plaque along the proximal internal carotid with minimal stenosis. Partially retropharyngeal course. Vertebral arteries: Patent. Right vertebral artery slightly dominant. No stenosis. Skeleton: Degenerative changes of the cervical spine. Other neck: Unremarkable. Upper chest: No apical lung mass. Review of the MIP images confirms  the above findings CTA HEAD Anterior circulation: The intracranial internal carotid arteries are patent. Anterior and middle cerebral arteries are patent. Posterior  circulation: Intracranial vertebral arteries are patent. Basilar artery is patent. Major cerebral artery origins are patent. The vertebrobasilar system is small in caliber secondary to bilateral posterior communicating arteries being the primary supply of the patent posterior cerebral arteries. Venous sinuses: Patent as allowed by contrast bolus timing. Review of the MIP images confirms the above findings IMPRESSION: No large vessel occlusion or hemodynamically significant stenosis. Electronically Signed   By: Macy Mis M.D.   On: 10/31/2020 20:27    EKG: Independently reviewed.  Sinus tachycardia.  Assessment/Plan Principal Problem:   SIRS (systemic inflammatory response syndrome) (HCC) Active Problems:   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HCC)   Chronic combined systolic and diastolic congestive heart failure (HCC)    SIRS likely source not clear.  However patient is having productive cough which is ongoing for the last 1 week and also has been having diarrhea.  Patient is on empiric antibiotics.  Follow cultures.  Check stool studies including C. difficile.  Continue with gentle hydration. Acute on chronic disease stage III last creatinine last month was around 1.9 it did worsened to 2.07 on September 2 and it is around 2.4 now.  We will gently hydrate follow metabolic panel.  Creatinine might have worsened due to patient's diarrhea. Acute encephalopathy at this time resolved.  Likely from possible infection source of which was unclear.  Neurologist recommended getting MRI brain once patient's ICD can be addressed. COVID-19 infection which patient was positive even for last month.  Chest x-ray shows vascular congestion.  Patient is not hypoxic.  We will closely monitor. History of chronic systolic and diastolic CHF last EF measured was 25 to 30% presently receiving fluids.  We will continue patient's Imdur/hydralazine and Corlanor and beta-blockers. Patient on apixaban likely for DVT. Chronic  macrocytic anemia on iron supplements and B12 supplements. Chronic thrombocytopenia follow CBC. History of Crohn's disease on Entocort and Remicade.   DVT prophylaxis: Apixaban. Code Status: Full code. Family Communication: Discussed with patient. Disposition Plan: Home when stable. Consults called: Neurology. Admission status: Observation.   Rise Patience MD [facility] Pager [phone].  If 7PM-7AM, please contact night-coverage [email or web address] Password [facility]  11/01/2020, 2:47 AM

## 2020-11-01 NOTE — Evaluation (Signed)
Physical Therapy Evaluation Patient Details Name: Brent [name] MRN: [id number] DOB: [birth year] Today's Date: 11/01/2020   History of Present Illness  Pt is 75 yo male admitted on 10/31/20 with confusion and slurred speech.  Admitted with SIRS unknown source.  CT head no acute changes, MRI pending.  Per chart encephalopathy that is clearing.  Pt recently d/c from SNF (~week ago).  Pt dx with COVID ~ month ago.  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  Clinical Impression  Pt admitted with above diagnosis. At baseline, pt is independent without use of AD.  He had recent hospitalization and SNF stay and has been using RW since that time and now has [facility] aide.  Today, pt able to ambulate 180' without AD but mild unsteadiness - recommend RW use.  Had 1 LOB but due to improperly fitting socks that were changed.  His strength is equal bilaterally.  He did have mild confusion and decreased safety awareness.  Will benefit from further therapy.  Pt currently with functional limitations due to the deficits listed below (see PT Problem List). Pt will benefit from skilled PT to increase their independence and safety with mobility to allow discharge to the venue listed below.       Follow Up Recommendations Home health PT;Supervision - Intermittent    Equipment Recommendations   (has RW)    Recommendations for Other Services       Precautions / Restrictions Precautions Precautions: Fall      Mobility  Bed Mobility Overal bed mobility: Needs Assistance Bed Mobility: Supine to Sit;Sit to Supine     Supine to sit: Supervision Sit to supine: Supervision        Transfers Overall transfer level: Needs assistance   Transfers: Sit to/from Stand Sit to Stand: Supervision         General transfer comment: performed x 2  Ambulation/Gait Ambulation/Gait assistance: Min guard;Min  assist Gait Distance (Feet): 180 Feet Assistive device: None Gait Pattern/deviations: Step-through pattern;Decreased stride length Gait velocity: decr   General Gait Details: Pt with decreased stride length and flexed gait pattern (knees flexed, limited hip ext) cued to look up and straighten knees and this helped to improve.  Mildly unsteady requiring min guard and 1 instance of min A for LOB when he stepped on his socks (pt's socks too small but in hurry to get to bathroom, stepped on toe of sock and tripped, once settled changed to larger socks)  Stairs            Wheelchair Mobility    Modified Rankin (Stroke Patients Only) Modified Rankin (Stroke Patients Only) Pre-Morbid Rankin Score: Slight disability Modified Rankin: Moderate disability     Balance Overall balance assessment: Needs assistance;History of Falls Sitting-balance support: No upper extremity supported;Feet supported Sitting balance-Leahy Scale: Good     Standing balance support: No upper extremity supported Standing balance-Leahy Scale: Fair Standing balance comment: Ambulating without AD but does have deficits -recommended RW                             Pertinent Vitals/Pain Pain Assessment: No/denies pain    Home Living Family/patient expects to be discharged to:: Private residence Living Arrangements: Alone Available Help at Discharge: Friend(s);Available PRN/intermittently;Neighbor;Personal care attendant (Has RN 1 x week, aide couple hours a week (he states just starting and can have her  come more if needed)) Type of Home: Apartment Home Access: Level entry     Home Layout: One level Home Equipment: Walker - 2 wheels;Cane - single point;Walker - 4 wheels      Prior Function Level of Independence: Needs assistance   Gait / Transfers Assistance Needed: Reports can ambulate in community; typically not using AD but has been since recent hospital admission  ADL's / Homemaking  Assistance Needed: Independent with ADLs and IADLs; states he drives and shops; states has RN comes 1 x week to assist with meds and recently added personal care aide for a few hours.  Comments: Reports 2 falls prior to last hospital admission.     Hand Dominance   Dominant Hand: Right    Extremity/Trunk Assessment   Upper Extremity Assessment Upper Extremity Assessment: Overall WFL for tasks assessed    Lower Extremity Assessment Lower Extremity Assessment:  (ROM WFL and MMT 5/5)    Cervical / Trunk Assessment Cervical / Trunk Assessment: Kyphotic  Communication   Communication: No difficulties  Cognition Arousal/Alertness: Awake/alert Behavior During Therapy: WFL for tasks assessed/performed Overall Cognitive Status: No family/caregiver present to determine baseline cognitive functioning Area of Impairment: Problem solving                               General Comments: Pt admitted with confusion but appears to be resolving.  He was alert and oriented x 4 and able to state President and 3 animals that begin with C.  Unable to count up by 3's and provided detailed hx but some confusion on timeline (per chart home for rehab for week but pt stating a day)      General Comments General comments (skin integrity, edema, etc.): Discussed home safety and recommendation for RW use    Exercises     Assessment/Plan    PT Assessment Patient needs continued PT services  PT Problem List Decreased strength;Decreased mobility;Decreased safety awareness;Decreased activity tolerance;Decreased balance;Decreased knowledge of use of DME;Decreased cognition;Decreased knowledge of precautions;Decreased coordination       PT Treatment Interventions DME instruction;Therapeutic activities;Gait training;Therapeutic exercise;Patient/family education;Balance training;Functional mobility training;Neuromuscular re-education    PT Goals (Current goals can be found in the Care Plan  section)  Acute Rehab PT Goals Patient Stated Goal: return home PT Goal Formulation: With patient Time For Goal Achievement: 11/15/20 Potential to Achieve Goals: Good Additional Goals Additional Goal #1: Pt will score > 19 on DGI for decreased fall risk    Frequency Min 3X/week   Barriers to discharge Decreased caregiver support      Co-evaluation               AM-PAC PT "6 Clicks" Mobility  Outcome Measure Help needed turning from your back to your side while in a flat bed without using bedrails?: None Help needed moving from lying on your back to sitting on the side of a flat bed without using bedrails?: None Help needed moving to and from a bed to a chair (including a wheelchair)?: A Little Help needed standing up from a chair using your arms (e.g., wheelchair or bedside chair)?: A Little Help needed to walk in hospital room?: A Little Help needed climbing 3-5 steps with a railing? : A Little 6 Click Score: 20    End of Session Equipment Utilized During Treatment: Gait belt Activity Tolerance: Patient tolerated treatment well Patient left: in bed;with call bell/phone within reach (in ED no alarms)  Nurse Communication: Mobility status PT Visit Diagnosis: Unsteadiness on feet (R26.81);History of falling (Z91.81);Muscle weakness (generalized) (M62.81)    Time: 2897-9150 PT Time Calculation (min) (ACUTE ONLY): 27 min   Charges:   PT Evaluation $PT Eval Low Complexity: 1 Low PT Treatments $Gait Training: 8-22 mins        Abran Richard, PT Acute Rehab Services Pager [phone] [facility] 11/01/2020, 11:42 AM

## 2020-11-01 NOTE — Progress Notes (Signed)
Patient arrived to [place] room 23 . Alert and oriented x4. Pain level 0. Bed in lowest position. Call light in reach. Will continue to monitor patient.

## 2020-11-01 NOTE — Plan of Care (Signed)
  Problem: Education: Goal: Knowledge of General Education information will improve Description: Including pain rating scale, medication(s)/side effects and non-pharmacologic comfort measures Outcome: Progressing   Problem: Clinical Measurements: Goal: Ability to maintain clinical measurements within normal limits will improve Outcome: Progressing   Problem: Clinical Measurements: Goal: Will remain free from infection Outcome: Progressing   Problem: Clinical Measurements: Goal: Respiratory complications will improve Outcome: Progressing   Problem: Clinical Measurements: Goal: Cardiovascular complication will be avoided Outcome: Progressing   Problem: Nutrition: Goal: Adequate nutrition will be maintained Outcome: Progressing   Problem: Pain Managment: Goal: General experience of comfort will improve Outcome: Progressing   Problem: Skin Integrity: Goal: Risk for impaired skin integrity will decrease Outcome: Progressing

## 2020-11-02 DIAGNOSIS — I130 Hypertensive heart and chronic kidney disease with heart failure and stage 1 through stage 4 chronic kidney disease, or unspecified chronic kidney disease: Secondary | ICD-10-CM | POA: Diagnosis present

## 2020-11-02 DIAGNOSIS — R4701 Aphasia: Secondary | ICD-10-CM | POA: Diagnosis present

## 2020-11-02 DIAGNOSIS — R6510 Systemic inflammatory response syndrome (SIRS) of non-infectious origin without acute organ dysfunction: Secondary | ICD-10-CM | POA: Diagnosis present

## 2020-11-02 DIAGNOSIS — Z888 Allergy status to other drugs, medicaments and biological substances status: Secondary | ICD-10-CM | POA: Diagnosis not present

## 2020-11-02 DIAGNOSIS — G9341 Metabolic encephalopathy: Secondary | ICD-10-CM | POA: Diagnosis present

## 2020-11-02 DIAGNOSIS — Z8616 Personal history of COVID-19: Secondary | ICD-10-CM | POA: Diagnosis not present

## 2020-11-02 DIAGNOSIS — I5042 Chronic combined systolic (congestive) and diastolic (congestive) heart failure: Secondary | ICD-10-CM | POA: Diagnosis present

## 2020-11-02 DIAGNOSIS — N184 Chronic kidney disease, stage 4 (severe): Secondary | ICD-10-CM | POA: Diagnosis present

## 2020-11-02 DIAGNOSIS — F419 Anxiety disorder, unspecified: Secondary | ICD-10-CM | POA: Diagnosis present

## 2020-11-02 DIAGNOSIS — F319 Bipolar disorder, unspecified: Secondary | ICD-10-CM | POA: Diagnosis present

## 2020-11-02 DIAGNOSIS — Z87891 Personal history of nicotine dependence: Secondary | ICD-10-CM | POA: Diagnosis not present

## 2020-11-02 DIAGNOSIS — E1122 Type 2 diabetes mellitus with diabetic chronic kidney disease: Secondary | ICD-10-CM | POA: Diagnosis present

## 2020-11-02 DIAGNOSIS — G4700 Insomnia, unspecified: Secondary | ICD-10-CM | POA: Diagnosis present

## 2020-11-02 DIAGNOSIS — N400 Enlarged prostate without lower urinary tract symptoms: Secondary | ICD-10-CM | POA: Diagnosis present

## 2020-11-02 DIAGNOSIS — J8410 Pulmonary fibrosis, unspecified: Secondary | ICD-10-CM | POA: Diagnosis present

## 2020-11-02 DIAGNOSIS — Z20822 Contact with and (suspected) exposure to covid-19: Secondary | ICD-10-CM | POA: Diagnosis present

## 2020-11-02 DIAGNOSIS — Z95810 Presence of automatic (implantable) cardiac defibrillator: Secondary | ICD-10-CM | POA: Diagnosis not present

## 2020-11-02 DIAGNOSIS — D539 Nutritional anemia, unspecified: Secondary | ICD-10-CM | POA: Diagnosis present

## 2020-11-02 DIAGNOSIS — K5000 Crohn's disease of small intestine without complications: Secondary | ICD-10-CM

## 2020-11-02 DIAGNOSIS — A419 Sepsis, unspecified organism: Secondary | ICD-10-CM | POA: Diagnosis present

## 2020-11-02 DIAGNOSIS — D696 Thrombocytopenia, unspecified: Secondary | ICD-10-CM | POA: Diagnosis present

## 2020-11-02 DIAGNOSIS — Z881 Allergy status to other antibiotic agents status: Secondary | ICD-10-CM | POA: Diagnosis not present

## 2020-11-02 DIAGNOSIS — K5090 Crohn's disease, unspecified, without complications: Secondary | ICD-10-CM | POA: Diagnosis present

## 2020-11-02 DIAGNOSIS — N390 Urinary tract infection, site not specified: Secondary | ICD-10-CM | POA: Diagnosis present

## 2020-11-02 DIAGNOSIS — G4730 Sleep apnea, unspecified: Secondary | ICD-10-CM | POA: Diagnosis present

## 2020-11-02 DIAGNOSIS — D849 Immunodeficiency, unspecified: Secondary | ICD-10-CM | POA: Diagnosis present

## 2020-11-02 LAB — BASIC METABOLIC PANEL
Anion gap: 9 (ref 5–15)
BUN: 24 mg/dL — ABNORMAL HIGH (ref 8–23)
CO2: 22 mmol/L (ref 22–32)
Calcium: 7.6 mg/dL — ABNORMAL LOW (ref 8.9–10.3)
Chloride: 111 mmol/L (ref 98–111)
Creatinine, Ser: 2.11 mg/dL — ABNORMAL HIGH (ref 0.61–1.24)
GFR, Estimated: 32 mL/min — ABNORMAL LOW (ref >60)
Glucose, Bld: 96 mg/dL (ref 70–99)
Potassium: 3.6 mmol/L (ref 3.5–5.1)
Sodium: 142 mmol/L (ref 135–145)

## 2020-11-02 LAB — CBC
HCT: 26.9 % — ABNORMAL LOW (ref 39.0–52.0)
Hemoglobin: 8.4 g/dL — ABNORMAL LOW (ref 13.0–17.0)
MCH: 34.1 pg — ABNORMAL HIGH (ref 26.0–34.0)
MCHC: 31.2 g/dL (ref 30.0–36.0)
MCV: 109.3 fL — ABNORMAL HIGH (ref 80.0–100.0)
Platelets: 114 10*3/uL — ABNORMAL LOW (ref 150–400)
RBC: 2.46 MIL/uL — ABNORMAL LOW (ref 4.22–5.81)
RDW: 14.6 % (ref 11.5–15.5)
WBC: 20.4 10*3/uL — ABNORMAL HIGH (ref 4.0–10.5)
nRBC: 0 % (ref 0.0–0.2)

## 2020-11-02 LAB — PROCALCITONIN: Procalcitonin: 0.48 ng/mL

## 2020-11-02 NOTE — Progress Notes (Signed)
PROGRESS NOTE    Brent [name]  ASN:[id number] DOB: Aug 21, 1945 DOA: 10/31/2020 PCP: Wardell Honour, MD    Chief Complaint  Patient presents with   Aphasia    Brief Narrative:      Brent [name] is a 75 y.o. 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 he was just discharged from skilled nursing facility about a week ago and has been developing diarrhea more than usual which is watery and multiple episodes.  Denies any vomiting.  Patient states he also has been having productive cough over the last 1 week. -In the ER patient had a fever of 100.3 F tachycardic and labs showing leukocytosis of 29,000.  Initial code stroke was called and neurology was consulted.  CT head unremarkable and CT angiogram of the head and neck did not show any large vessel obstruction.  Labs do show worsening renal function of creatinine of 2.4 which is worsened from last month of 1.9.  COVID test is positive.  Patient was COVID-positive about a month ago.  UA does show features with small leukocyte esterase and 21-50 WBC count.  Chest x-ray shows vascular congestion.  Patient was diagnosed with COVID-19 infection a month ago.  His screening COVID-19 remains positive on admission, no need to isolate.     Assessment & Plan:   Principal Problem:   SIRS (systemic inflammatory response syndrome) (HCC) Active Problems:   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HCC)   Chronic combined systolic and diastolic congestive heart failure ([place])  SIRS  -Patient presented with fever, significant leukocytosis at 29K. -Potential sources could be due to UTI given leukocyte positive, and pyuria, and evidence of bladder  inflammation on imaging. -Given significantly immunocompromised status with strong immunosuppressive therapy due to Crohn's disease we will continue with broad-spectrum antibiotic today. -Continue to follow on blood cultures.  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  -Continue to monitor closely and avoid nephrotoxic medications.   Acute encephalopathy at this time resolved.  -  Likely from possible infection source. -Mentation back to baseline, I have discussed with neurology, this is related to infectious process, no concerns of acute ischemic process, so no indication for MRI, which has been canceled.  COVID-19 infection which patient was positive even for last month.  Chest x-ray shows vascular congestion.  Patient is not hypoxic.  We will closely monitor.  History of chronic systolic and diastolic CHF last EF measured was 25 to 30%  -   We will continue patient's Imdur/hydralazine and Corlanor and beta-blockers. -Monitor volume status closely.  A FIB  - on Eliquis  Chronic macrocytic anemia on iron supplements and B12 supplements.  Chronic thrombocytopenia follow CBC.  History of Crohn's disease on Entocort and Remicade. -[facility]  -CT abdomen pelvis significant for inflammation around the rectal area, possible proctitis, but overall patient is improving on antibiotics, as well as diarrhea is improving, I have discussed with GI Dr. Lyndel Safe for now we will Hold initiating steroids especially with possible sepsis due to urinary source.   DVT prophylaxis: Eliquis Code Status: Full Family Communication:  None at bedside Disposition:   Status is: Observation  The patient will require care spanning > 2 midnights and should be moved to inpatient because: IV treatments appropriate due to intensity of illness or inability to take PO  Dispo: The patient is from:  Home              Anticipated d/c is to: Home              Patient currently is medically stable to d/c.   Difficult to place patient No       Consultants:  None   Subjective:  Patient reports he is feeling better today, no nausea, no vomiting, no abdominal pain, diarrhea significantly subsided.  Objective: Vitals:   11/01/20 2108 11/02/20 0040 11/02/20 0459 11/02/20 0900  BP: (!) 163/90 (!) 155/80 (!) 157/72 (!) 151/92  Pulse: 89 80 75 73  Resp: 18 18 17 14   Temp: 98.6 F (37 C) (!) 97.5 F (36.4 C) 97.6 F (36.4 C) (!) 97.1 F (36.2 C)  TempSrc: Oral Oral Oral Oral  SpO2: 97%  100% 97%  Weight:      Height:        Intake/Output Summary (Last 24 hours) at 11/02/2020 1250 Last data filed at 11/02/2020 1100 Gross per 24 hour  Intake 940 ml  Output 100 ml  Net 840 ml   Filed Weights   10/31/20 2033  Weight: 76.2 kg    Examination:  Awake Alert, Oriented X 3, No new F.N deficits, Normal affect Symmetrical Chest wall movement, Good air movement bilaterally, CTAB RRR,No Gallops,Rubs or new Murmurs, No Parasternal Heave +ve B.Sounds, Abd Soft, No tenderness, No rebound - guarding or rigidity. No Cyanosis, Clubbing or edema, No new Rash or bruise       Data Reviewed: I have personally reviewed following labs and imaging studies  CBC: Recent Labs  Lab 10/31/20 1945 10/31/20 2021 11/01/20 0245 11/02/20 0442  WBC 29.1*  --  29.6* 20.4*  NEUTROABS 26.5*  --  27.2*  --   HGB 9.0* 9.9* 9.7* 8.4*  HCT 28.6* 29.0* 31.1* 26.9*  MCV 108.7*  --  110.7* 109.3*  PLT 134*  --  141* 114*    Basic Metabolic Panel: Recent Labs  Lab 10/31/20 1945 10/31/20 2021 11/01/20 0245 11/02/20 0442  NA 137 142 140 142  K 3.3* 3.4* 3.4* 3.6  CL 106 106 107 111  CO2 24  --  23 22  GLUCOSE 102* 100* 125* 96  BUN 28* 28* 27* 24*  CREATININE 2.45* 2.50* 2.32* 2.11*  CALCIUM 7.7*  --  7.8* 7.6*    GFR: Estimated Creatinine Clearance: 32.6 mL/min (A) (by C-G formula based  on SCr of 2.11 mg/dL (H)).  Liver Function Tests: Recent Labs  Lab 10/31/20 1945 11/01/20 0245  AST 25 23  ALT 26 27  ALKPHOS 81 80  BILITOT 0.9 0.8  PROT 6.0* 6.7  ALBUMIN 2.5* 2.6*    CBG: Recent Labs  Lab 10/31/20 1948  GLUCAP 106*     Recent Results (from the past 240 hour(s))  Resp Panel by RT-PCR (Flu A&B, Covid) Nasopharyngeal Swab     Status: Abnormal   Collection Time: 10/31/20  7:46 PM   Specimen: Nasopharyngeal Swab; Nasopharyngeal(NP) swabs in vial transport medium  Result Value Ref Range Status   SARS Coronavirus 2 by RT PCR POSITIVE (A) NEGATIVE Final    Comment: RESULT CALLED TO, READ BACK BY AND VERIFIED WITH: RN TONY SINES 10/31/20@21 :48 BY  TW (NOTE) SARS-CoV-2 target nucleic acids are DETECTED.  The SARS-CoV-2 RNA is generally detectable in upper respiratory specimens during the acute phase of infection. Positive results are indicative of the presence of the identified virus, but do not rule out bacterial infection or co-infection with other pathogens not detected by the test. Clinical correlation with patient history and other diagnostic information is necessary to determine patient infection status. The expected result is Negative.  Fact Sheet for Patients: EntrepreneurPulse.com.au  Fact Sheet for Healthcare Providers: IncredibleEmployment.be  This test is not yet approved or cleared by the [place] FDA and  has been authorized for detection and/or diagnosis of SARS-CoV-2 by FDA under an Emergency Use Authorization (EUA).  This EUA will remain in effect (meaning this test can be  used) for the duration of  the COVID-19 declaration under Section 564(b)(1) of the Act, 21 U.S.C. section 360bbb-3(b)(1), unless the authorization is terminated or revoked sooner.     Influenza A by PCR NEGATIVE NEGATIVE Final   Influenza B by PCR NEGATIVE NEGATIVE Final    Comment: (NOTE) The Xpert Xpress SARS-CoV-2/FLU/RSV  plus assay is intended as an aid in the diagnosis of influenza from Nasopharyngeal swab specimens and should not be used as a sole basis for treatment. Nasal washings and aspirates are unacceptable for Xpert Xpress SARS-CoV-2/FLU/RSV testing.  Fact Sheet for Patients: EntrepreneurPulse.com.au  Fact Sheet for Healthcare Providers: IncredibleEmployment.be  This test is not yet approved or cleared by the [place] FDA and has been authorized for detection and/or diagnosis of SARS-CoV-2 by FDA under an Emergency Use Authorization (EUA). This EUA will remain in effect (meaning this test can be used) for the duration of the COVID-19 declaration under Section 564(b)(1) of the Act, 21 U.S.C. section 360bbb-3(b)(1), unless the authorization is terminated or revoked.  Performed at [facility], [place] [address] [place], [place] [address]   Blood culture (routine x 2)     Status: None (Preliminary result)   Collection Time: 10/31/20  9:12 PM   Specimen: BLOOD  Result Value Ref Range Status   Specimen Description BLOOD RIGHT ANTECUBITAL  Final   Special Requests   Final    BOTTLES DRAWN AEROBIC AND ANAEROBIC Blood Culture adequate volume   Culture   Final    NO GROWTH 2 DAYS Performed at [facility], [place] [address] [place], [place] [address]    Report Status PENDING  Incomplete  Blood culture (routine x 2)     Status: None (Preliminary result)   Collection Time: 10/31/20  9:20 PM   Specimen: BLOOD RIGHT HAND  Result Value Ref Range Status   Specimen Description BLOOD RIGHT HAND  Final   Special Requests   Final    BOTTLES DRAWN AEROBIC AND ANAEROBIC Blood Culture adequate volume   Culture   Final    NO GROWTH 2 DAYS Performed at [facility], [place] [address], [place], [place] [address]    Report Status PENDING  Incomplete  Gastrointestinal Panel by PCR , Stool     Status: None   Collection Time: 11/01/20  6:11 AM    Specimen: Stool  Result Value Ref Range Status   Campylobacter species NOT DETECTED NOT DETECTED Final   Plesimonas shigelloides NOT DETECTED NOT DETECTED Final   Salmonella species NOT DETECTED NOT DETECTED Final   Yersinia enterocolitica NOT DETECTED NOT DETECTED Final   Vibrio species NOT DETECTED NOT DETECTED Final   Vibrio cholerae NOT DETECTED NOT DETECTED Final  Enteroaggregative E coli (EAEC) NOT DETECTED NOT DETECTED Final   Enteropathogenic E coli (EPEC) NOT DETECTED NOT DETECTED Final   Enterotoxigenic E coli (ETEC) NOT DETECTED NOT DETECTED Final   Shiga like toxin producing E coli (STEC) NOT DETECTED NOT DETECTED Final   Shigella/Enteroinvasive E coli (EIEC) NOT DETECTED NOT DETECTED Final   Cryptosporidium NOT DETECTED NOT DETECTED Final   Cyclospora cayetanensis NOT DETECTED NOT DETECTED Final   Entamoeba histolytica NOT DETECTED NOT DETECTED Final   Giardia lamblia NOT DETECTED NOT DETECTED Final   Adenovirus F40/41 NOT DETECTED NOT DETECTED Final   Astrovirus NOT DETECTED NOT DETECTED Final   Norovirus GI/GII NOT DETECTED NOT DETECTED Final   Rotavirus A NOT DETECTED NOT DETECTED Final   Sapovirus (I, II, IV, and V) NOT DETECTED NOT DETECTED Final    Comment: Performed at [facility], [address] [place], [place] [address]  C Difficile Quick Screen w PCR reflex     Status: None   Collection Time: 11/01/20  6:11 AM   Specimen: Stool  Result Value Ref Range Status   C Diff antigen NEGATIVE NEGATIVE Final   C Diff toxin NEGATIVE NEGATIVE Final   C Diff interpretation No C. difficile detected.  Final    Comment: Performed at [facility], [place] [address], [place], [place] [address]         Radiology Studies: CT ABDOMEN PELVIS WO CONTRAST  Result Date: 11/01/2020 CLINICAL DATA:  Diarrhea. Crohn's disease. Prior bowel resections. Sepsis. EXAM: CT ABDOMEN AND PELVIS WITHOUT CONTRAST TECHNIQUE: Multidetector CT imaging of the abdomen and pelvis was  performed following the standard protocol without IV contrast. COMPARISON:  Multiple exams, including 05/31/2017 FINDINGS: Lower chest: Defibrillator lead terminates the right ventricular apex. Trace anterior pericardial effusion. Borderline cardiomegaly. Trace bilateral pleural effusions. Mild dependent subsegmental atelectasis in both lower lobes. Hepatobiliary: Cholecystectomy. Common bile duct approximately 1 cm in diameter, similar to prior exams, with prominence likely representing physiologic response to cholecystectomy. Pancreas: Unremarkable Spleen: Unremarkable Adrenals/Urinary Tract: 1.2 by 1.5 cm left adrenal nodule on image 27 series 3, currently 12 Hounsfield units which is indeterminate, but shown to be an adrenal adenoma on prior abdominal MRI of 05/05/2008. Right adrenal gland unremarkable. Scattered hypodense hepatic lesions are likely cysts, as characterized on multiple prior exams including the renal ultrasound of 03/23/2020. IV contrast was not administered on today's exam and accordingly the enhancement characteristics of these lesions are not interrogated. There is leftover contrast in the renal collecting systems and ureters bilaterally, likely related to the CT angiography exam performed yesterday. No hydronephrosis or hydroureter. Contrast medium is present in the urinary bladder. There is a right posterior bladder diverticulum. Because of the presence of the collecting system contrast, sensitivity for small nonobstructive renal calculi is low. However, calculi have not been shown on prior exams. There is some subtle indistinctness of the bladder adventitious/serosa which could reflect cystitis. Stomach/Bowel: Air-levels in the sigmoid colon and rectum suggesting diarrheal process. Suspected wall thickening in the lower rectum with some mild presacral stranding, suspicious for inflammation, although strictly speaking rectal tumor is not excluded. Prior partial right colectomy. No dilated  small bowel. No extraluminal gas identified. Vascular/Lymphatic: Atherosclerosis is present, including aortoiliac atherosclerotic disease. Reproductive: Unremarkable Other: No supplemental non-categorized findings. Musculoskeletal: Lumbar spondylosis and degenerative disc disease with grade 1 degenerative anterolisthesis at L4-5. This causes mild to moderate right foraminal stenosis at L4-5 and mild left foraminal stenosis at L3-4, L4-5, L5-S1. IMPRESSION: 1. Distal colonic  air-levels indicating diarrheal process. 2. Wall thickening in the lower rectum, with some mild perirectal stranding and presacral stranding. The appearance favors inflammation in the context of the patient's history. Lower rectal tumor is a less likely differential diagnostic consideration. 3. Please note that today's exam was performed without IV contrast, and accordingly some signs of active Crohn's disease such as mucosal enhancement might not be readily apparent. 4. Mild stranding around the margins of the urinary bladder which could reflect cystitis, correlate with urine analysis. No gas is evident in the urinary bladder. 5. Other imaging findings of potential clinical significance: Trace anterior pericardial effusion. Trace bilateral pleural effusions. Stable left adrenal adenoma. Bilateral renal cysts. Aortic Atherosclerosis (ICD10-I70.0). Multilevel lumbar impingement. Electronically Signed   By: Van Clines M.D.   On: 11/01/2020 10:58   DG Chest Portable 1 View  Result Date: 10/31/2020 CLINICAL DATA:  Confusion, slurred speech EXAM: PORTABLE CHEST 1 VIEW COMPARISON:  10/08/2020 FINDINGS: Left AICD remains in place, unchanged. Cardiomegaly, vascular congestion. No confluent opacities, effusions or overt edema. IMPRESSION: Cardiomegaly, vascular congestion. Electronically Signed   By: Rolm Baptise M.D.   On: 10/31/2020 20:39   CT HEAD CODE STROKE WO CONTRAST  Result Date: 10/31/2020 CLINICAL DATA:  Code stroke.  Neuro  deficit, acute, stroke suspected EXAM: CT HEAD WITHOUT CONTRAST TECHNIQUE: Contiguous axial images were obtained from the base of the skull through the vertex without intravenous contrast. COMPARISON:  July 2020 FINDINGS: Brain: There is no acute intracranial hemorrhage, mass effect, or edema. No new loss of gray-white differentiation. Prominence of the ventricles and sulci reflects parenchymal volume loss with mild progression since 2020. Patchy hypoattenuation in the supratentorial white matter is nonspecific but probably reflects mild to moderate chronic microvascular ischemic changes. No extra-axial collection. Vascular: No hyperdense vessel. Skull: Unremarkable. Sinuses/Orbits: Paranasal sinus mucosal thickening. No significant orbital abnormality. Other: Mastoid air cells are clear. ASPECTS ([place] Stroke Program Early CT Score) - Ganglionic level infarction (caudate, lentiform nuclei, internal capsule, insula, M1-M3 cortex): 7 - Supraganglionic infarction (M4-M6 cortex): 3 Total score (0-10 with 10 being normal): 10 IMPRESSION: There is no acute intracranial hemorrhage or evidence of acute infarction. ASPECT score is 10. Chronic microvascular ischemic changes. These results were communicated to Dr. Rory Percy at 8:00 pm on 10/31/2020 by text page via the [facility] messaging system. Electronically Signed   By: Macy Mis M.D.   On: 10/31/2020 20:03   CT ANGIO HEAD NECK W WO CM (CODE STROKE)  Result Date: 10/31/2020 CLINICAL DATA:  Neuro deficit, acute, stroke suspected; abnormal speech EXAM: CT ANGIOGRAPHY HEAD AND NECK TECHNIQUE: Multidetector CT imaging of the head and neck was performed using the standard protocol during bolus administration of intravenous contrast. Multiplanar CT image reconstructions and MIPs were obtained to evaluate the vascular anatomy. Carotid stenosis measurements (when applicable) are obtained utilizing NASCET criteria, using the distal internal carotid diameter as the denominator.  CONTRAST:  28m OMNIPAQUE IOHEXOL 350 MG/ML SOLN COMPARISON:  None. FINDINGS: CTA NECK Aortic arch: Great vessel origins are patent. There is direct origin of the left vertebral artery from the arch. Right carotid system: Patent. Trace calcified plaque along the proximal internal carotid without stenosis. Left carotid system: Patent. Mild mixed plaque along the proximal internal carotid with minimal stenosis. Partially retropharyngeal course. Vertebral arteries: Patent. Right vertebral artery slightly dominant. No stenosis. Skeleton: Degenerative changes of the cervical spine. Other neck: Unremarkable. Upper chest: No apical lung mass. Review of the MIP images confirms the above findings CTA HEAD Anterior  circulation: The intracranial internal carotid arteries are patent. Anterior and middle cerebral arteries are patent. Posterior circulation: Intracranial vertebral arteries are patent. Basilar artery is patent. Major cerebral artery origins are patent. The vertebrobasilar system is small in caliber secondary to bilateral posterior communicating arteries being the primary supply of the patent posterior cerebral arteries. Venous sinuses: Patent as allowed by contrast bolus timing. Review of the MIP images confirms the above findings IMPRESSION: No large vessel occlusion or hemodynamically significant stenosis. Electronically Signed   By: Macy Mis M.D.   On: 10/31/2020 20:27        Scheduled Meds:  allopurinol  200 mg Oral Daily   apixaban  5 mg Oral BID   budesonide  9 mg Oral Daily   calcium carbonate  1,250 mg Oral Q breakfast   cholecalciferol  5,000 Units Oral Daily   darifenacin  7.5 mg Oral Daily   divalproex  1,500 mg Oral QHS   feeding supplement  237 mL Oral BID BM   ferrous sulfate  325 mg Oral BID WC   isosorbide mononitrate  60 mg Oral Daily   ivabradine  5 mg Oral BID WC   magnesium oxide  400 mg Oral Daily   metoprolol succinate  25 mg Oral BID   mirtazapine  7.5 mg Oral QHS    multivitamin with minerals  1 tablet Oral Daily   pantoprazole  40 mg Oral Daily   QUEtiapine  150 mg Oral QHS   tamsulosin  0.4 mg Oral Daily   vitamin B-12  1,000 mcg Oral Daily   Continuous Infusions:  ceFEPime (MAXIPIME) IV Stopped (11/02/20 0051)   [START ON 11/03/2020] vancomycin       LOS: 0 days      Phillips Climes, MD [facility]   To contact the attending provider between 7A-7P or the covering provider during after hours 7P-7A, please log into the web site [email or web address] and access using universal [place] password for that web site. If you do not have the password, please call the hospital operator.  11/02/2020, 12:50 PM

## 2020-11-02 NOTE — Progress Notes (Signed)
Physical Therapy Treatment Patient Details Name: Brent [name] MRN: [id number] DOB: 06-05-1945 Today's Date: 11/02/2020    History of Present Illness Pt is 75 yo male admitted on 10/31/20 with confusion and slurred speech.  Admitted with SIRS unknown source.  CT head no acute changes, MRI pending.  Per chart encephalopathy that is clearing.  Pt recently d/c from SNF (~week ago).  Pt dx with COVID ~ month ago.  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    PT Comments    Pt is up to walk and then reviewed HEP with addition of stretches.  Pt is motivated to try with encouragement, but does tend to sit as able.  Fatigue is still impacting him but with HHPT and family support should be able to return to PLOF.  He is not desaturating out of the 90% range with gait and exercises, and will continue to monitor his vitals for consistency and safety to transfer to home.  Follow acutely for goals of PT.   Follow Up Recommendations  Home health PT;Supervision for mobility/OOB     Equipment Recommendations  Rolling walker with 5" wheels    Recommendations for Other Services       Precautions / Restrictions Precautions Precautions: Fall Precaution Comments: pt is distracted by his covid  restrictions, cannot understand that he cannot go to hallway to walk Restrictions Weight Bearing Restrictions: No    Mobility  Bed Mobility               General bed mobility comments: up in chair when PT arrived    Transfers Overall transfer level: Needs assistance Equipment used: Rolling walker (2 wheeled) Transfers: Sit to/from Stand Sit to Stand: Min guard;Supervision         General transfer comment: supervisoin from chair, min guard from sofa  Ambulation/Gait Ambulation/Gait assistance: Min guard;Min assist Gait Distance (Feet): 70 Feet (30+40) Assistive device: 1 person hand  held assist Gait Pattern/deviations: Step-through pattern;Decreased stride length;Wide base of support;Drifts right/left Gait velocity: reduced Gait velocity interpretation: <1.31 ft/sec, indicative of household ambulator General Gait Details: instability on turns with tendency to shuffle feet, lateral instabilty to shift to the left more than R   Stairs             Wheelchair Mobility    Modified Rankin (Stroke Patients Only) Modified Rankin (Stroke Patients Only) Pre-Morbid Rankin Score: Slight disability Modified Rankin: Moderate disability     Balance Overall balance assessment: Needs assistance Sitting-balance support: Feet supported Sitting balance-Leahy Scale: Good     Standing balance support: No upper extremity supported Standing balance-Leahy Scale: Fair                              Cognition Arousal/Alertness: Awake/alert Behavior During Therapy: WFL for tasks assessed/performed Overall Cognitive Status: No family/caregiver present to determine baseline cognitive functioning Area of Impairment: Safety/judgement;Awareness;Problem solving                       Following Commands: Follows one step commands with increased time;Follows one step commands inconsistently Safety/Judgement: Decreased awareness of safety;Decreased awareness of deficits Awareness: Intellectual Problem Solving: Slow processing;Requires verbal cues;Requires tactile cues General Comments: Pt is mildly confused but mainly distracted with the evolution of his covid dx      Exercises General Exercises - Lower Extremity Ankle  Circles/Pumps: AAROM;5 reps Long Arc Quad: AROM;10 reps Heel Slides: AROM;10 reps Hip ABduction/ADduction: AROM;10 reps    General Comments General comments (skin integrity, edema, etc.): Pt is up to chair and requires help to problem solve his posture, safety of transitions as well as navigating obstacles.  Tends to sit as much as he can,  requires encouragement to increase distances      Pertinent Vitals/Pain Pain Assessment: No/denies pain    Home Living                      Prior Function            PT Goals (current goals can now be found in the care plan section) Progress towards PT goals: Progressing toward goals    Frequency    Min 3X/week      PT Plan Current plan remains appropriate    Co-evaluation              AM-PAC PT "6 Clicks" Mobility   Outcome Measure  Help needed turning from your back to your side while in a flat bed without using bedrails?: None Help needed moving from lying on your back to sitting on the side of a flat bed without using bedrails?: None Help needed moving to and from a bed to a chair (including a wheelchair)?: A Little Help needed standing up from a chair using your arms (e.g., wheelchair or bedside chair)?: A Little Help needed to walk in hospital room?: A Little Help needed climbing 3-5 steps with a railing? : A Little 6 Click Score: 20    End of Session Equipment Utilized During Treatment: Gait belt Activity Tolerance: Patient tolerated treatment well Patient left: in chair;with call bell/phone within reach;with chair alarm set Nurse Communication: Mobility status PT Visit Diagnosis: Unsteadiness on feet (R26.81);History of falling (Z91.81);Muscle weakness (generalized) (M62.81)     Time: 2952-8413 PT Time Calculation (min) (ACUTE ONLY): 30 min  Charges:  $Gait Training: 8-22 mins $Therapeutic Exercise: 8-22 mins                 Ramond Dial 11/02/2020, 5:59 PM  Mee Hives, PT MS Acute Rehab Dept. Number: [place] and [place]

## 2020-11-02 NOTE — Care Management (Signed)
Spoke to patient via phone. Patient from home, active with [facility].  Confirmed with Ramond Marrow with [facility], resumption of care orders entered

## 2020-11-02 NOTE — Progress Notes (Signed)
Initial Nutrition Assessment  DOCUMENTATION CODES:   Not applicable  INTERVENTION:   Continue Ensure Enlive po BID, each supplement provides 350 kcal and 20 grams of protein   Continue MVI with Minerals   NUTRITION DIAGNOSIS:   Inadequate oral intake related to decreased appetite as evidenced by per patient/family report.  GOAL:   Patient will meet greater than or equal to 90% of their needs  MONITOR:   PO intake, Supplement acceptance, Labs, Weight trends  REASON FOR ASSESSMENT:   Malnutrition Screening Tool    ASSESSMENT:   75 yo male admitted with SIRS, Crohn's regional enteritis, acute on chronic CKD, AMS. Recent COVID-19 diagnosis. PMH Crohn's Disease, Afib, CHF EF 25-30%, macrocytic anemia  CT abdomen/pelvis significant for possible proctitis Diarrhea currently improving. Of note, diarrhea is a chronic problem for this patient   Recorded po intake 100% on average; appetite good currently. Pt likes Ensure shakes, plan to continue  No weight loss per weight encounters  Labs: Creatinine 2.11, BUN 24 Meds: calcium carbonate, Vit D, ferrous sulfate, mag ox, remeron, MVI with Minerals   Diet Order:   Diet Order             Diet Heart Room service appropriate? Yes; Fluid consistency: Thin; Fluid restriction: 1200 mL Fluid  Diet effective now                   EDUCATION NEEDS:   No education needs have been identified at this time  Skin:  Skin Assessment: Reviewed RN Assessment  Last BM:  9/7  Height:   Ht Readings from Last 1 Encounters:  10/31/20 6' 3.98" (1.93 m)    Weight:   Wt Readings from Last 1 Encounters:  10/31/20 76.2 kg     BMI:  Body mass index is 20.46 kg/m.  Estimated Nutritional Needs:   Kcal:  2100-2300 kcals  Protein:  100-115 g  Fluid:  >/-= 2 L   Kerman Passey MS, RDN, LDN, CNSC Registered Dietitian III Clinical Nutrition RD Pager and On-Call Pager Number Located in [place]

## 2020-11-03 DIAGNOSIS — A419 Sepsis, unspecified organism: Principal | ICD-10-CM

## 2020-11-03 LAB — BASIC METABOLIC PANEL
Anion gap: 8 (ref 5–15)
BUN: 24 mg/dL — ABNORMAL HIGH (ref 8–23)
CO2: 22 mmol/L (ref 22–32)
Calcium: 7.3 mg/dL — ABNORMAL LOW (ref 8.9–10.3)
Chloride: 112 mmol/L — ABNORMAL HIGH (ref 98–111)
Creatinine, Ser: 2.14 mg/dL — ABNORMAL HIGH (ref 0.61–1.24)
GFR, Estimated: 31 mL/min — ABNORMAL LOW (ref >60)
Glucose, Bld: 117 mg/dL — ABNORMAL HIGH (ref 70–99)
Potassium: 3.2 mmol/L — ABNORMAL LOW (ref 3.5–5.1)
Sodium: 142 mmol/L (ref 135–145)

## 2020-11-03 LAB — CBC
HCT: 27.7 % — ABNORMAL LOW (ref 39.0–52.0)
Hemoglobin: 8.6 g/dL — ABNORMAL LOW (ref 13.0–17.0)
MCH: 33.9 pg (ref 26.0–34.0)
MCHC: 31 g/dL (ref 30.0–36.0)
MCV: 109.1 fL — ABNORMAL HIGH (ref 80.0–100.0)
Platelets: 126 10*3/uL — ABNORMAL LOW (ref 150–400)
RBC: 2.54 MIL/uL — ABNORMAL LOW (ref 4.22–5.81)
RDW: 14.5 % (ref 11.5–15.5)
WBC: 13.7 10*3/uL — ABNORMAL HIGH (ref 4.0–10.5)
nRBC: 0 % (ref 0.0–0.2)

## 2020-11-03 LAB — PROCALCITONIN: Procalcitonin: 0.41 ng/mL

## 2020-11-03 MED ORDER — CEPHALEXIN 250 MG PO CAPS: 250.0000 mg | ORAL_CAPSULE | Freq: Two times a day (BID) | ORAL | 0 refills | Status: AC

## 2020-11-03 NOTE — Discharge Instructions (Signed)
Follow with Primary MD Wardell Honour, MD in 7 days   Get CBC, CMP, checked  by Primary MD next visit.    Activity: As tolerated with Full fall precautions use walker/cane & assistance as needed   Disposition Home    Diet: Heart Healthy  , with feeding assistance and aspiration precautions.    On your next visit with your primary care physician please Get Medicines reviewed and adjusted.   Please request your Prim.MD to go over all Hospital Tests and Procedure/Radiological results at the follow up, please get all Hospital records sent to your Prim MD by signing hospital release before you go home.   If you experience worsening of your admission symptoms, develop shortness of breath, life threatening emergency, suicidal or homicidal thoughts you must seek medical attention immediately by calling 911 or calling your MD immediately  if symptoms less severe.  You Must read complete instructions/literature along with all the possible adverse reactions/side effects for all the Medicines you take and that have been prescribed to you. Take any new Medicines after you have completely understood and accpet all the possible adverse reactions/side effects.    Do not drive when taking Pain medications.    Do not take more than prescribed Pain, Sleep and Anxiety Medications  Special Instructions: If you have smoked or chewed Tobacco  in the last 2 yrs please stop smoking, stop any regular Alcohol  and or any Recreational drug use.  Wear Seat belts while driving.   Please note  You were cared for by a hospitalist during your hospital stay. If you have any questions about your discharge medications or the care you received while you were in the hospital after you are discharged, you can call the unit and asked to speak with the hospitalist on call if the hospitalist that took care of you is not available. Once you are discharged, your primary care physician will handle any further medical  issues. Please note that NO REFILLS for any discharge medications will be authorized once you are discharged, as it is imperative that you return to your primary care physician (or establish a relationship with a primary care physician if you do not have one) for your aftercare needs so that they can reassess your need for medications and monitor your lab values.

## 2020-11-03 NOTE — TOC Transition Note (Signed)
Transition of Care [facility]) - CM/SW Discharge Note   Patient Details  Name: Brent [name] MRN: [id number] Date of Birth: [birth year]  Transition [facility]) CM/SW Contact:  Bartholomew Crews, RN Phone Number: [phone] 11/03/2020, 1:31 PM   Clinical Narrative:     Spoke with patient on the phone to discuss transition home. Corene Cornea at [facility] notified of transition home. HH orders placed per provider. Verified that RW was drop shipped to patient's home by [employer] delivered on 9/8. No further TOC needs identified.   Final next level of care: [place] Barriers to Discharge: No Barriers Identified   Patient Goals and CMS Choice Patient states their goals for this hospitalization and ongoing recovery are:: return home CMS Medicare.gov Compare Post Acute Care list provided to:: Patient    Discharge Placement                       Discharge Plan and Services                            [place]: [facility] Date [place]: 11/03/20 Time [place]: 1200 Representative spoke with at [place]: [facility] ([place]) Interventions     Readmission Risk Interventions No flowsheet data found.

## 2020-11-03 NOTE — Discharge Summary (Signed)
Physician Discharge Summary  Brent [name] QIW:[id number] DOB: January 17, 1946 DOA: 10/31/2020  PCP: Wardell Honour, MD  Admit date: 10/31/2020 Discharge date: 11/03/2020  Admitted From: Home Disposition:  Home  Recommendations for Outpatient Follow-up:  Follow up with PCP in 1-2 weeks Please obtain BMP/CBC in one week Patient was instructed to keep his appointment with GI as an outpatient  Home Health:YES Equipment/Devices: Rolling walker  Discharge Condition:Stable CODE STATUS:FULL Diet recommendation: Heart Healthy / Carb Modified   Brief/Interim Summary:  Brent [name] is a 75 y.o. 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 he was just discharged from skilled nursing facility about a week ago and has been developing diarrhea more than usual which is watery and multiple episodes.  Denies any vomiting.  Patient states he also has been having productive cough over the last 1 week. -In the ER patient had a fever of 100.3 F tachycardic and labs showing leukocytosis of 29,000.  Initial code stroke was called and neurology was consulted.  CT head unremarkable and CT angiogram of the head and neck did not show any large vessel obstruction.  Labs do show worsening renal function of creatinine of 2.4 which is worsened from last month of 1.9.  COVID test is positive.  Patient was COVID-positive about a month ago.  UA does show features with small leukocyte esterase and 21-50 WBC count.  Chest x-ray shows vascular congestion.   Patient was diagnosed with COVID-19 infection a month ago.  His screening COVID-19 remains positive on admission, no need to isolate.       Discharge Diagnoses:   Principal Problem:   SIRS (systemic inflammatory response syndrome) (HCC) Active Problems:   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HCC)   Chronic combined systolic and diastolic congestive heart failure (HCC)  SEPSIS, POA  -Patient presented with fever, significant leukocytosis at 29K. -Potential sources could be due to UTI given leukocyte positive, and pyuria, and evidence of bladder inflammation on imaging. -Given significantly immunocompromised status with strong immunosuppressive therapy due to Crohn's disease he was kept on broad-spectrum antibiotics, clinically he is much improved, his blood cultures remains negative, afebrile, urine culture was not sent on admission, given immunocompromise status, and evidence of significant cystitis on imaging he will be treated for total of 7 days, he will be discharged another 5 days of oral Keflex as an outpatient.     History of Crohn's disease on Entocort and Remicade. -[facility]  -[facility] abdomen pelvis significant for inflammation around the rectal area, possible proctitis, but overall patient is improving on antibiotics, as well as diarrhea is improving, he is having no further GI symptoms, so no indication for steroids as discussed with GI.      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    Acute encephalopathy at this time resolved.  -  due to sepsis -Mentation back to baseline, I have discussed with neurology, this is related to infectious process, no concerns of acute ischemic process, so no indication for MRI, which has been canceled.   COVID-19 infection which  patient was positive even for last month.  Chest x-ray shows vascular congestion.  Patient is not hypoxic.  We will closely monitor.   History of chronic systolic and diastolic CHF last EF measured was 25 to 30%  -   We will continue patient's  Imdur/hydralazine and Corlanor and beta-blockers. -Monitor volume status closely.   A FIB  - on Eliquis   Chronic macrocytic anemia on iron supplements and B12 supplements.   Chronic thrombocytopenia follow CBC.   Diabetes mellitus, type II -Continue with home medications     Discharge Instructions  Discharge Instructions     Diet - low sodium heart healthy   Complete by: As directed    Discharge instructions   Complete by: As directed    Follow with Primary MD Wardell Honour, MD in 7 days   Get CBC, CMP, checked  by Primary MD next visit.    Activity: As tolerated with Full fall precautions use walker/cane & assistance as needed   Disposition Home    Diet: Heart Healthy  , with feeding assistance and aspiration precautions.    On your next visit with your primary care physician please Get Medicines reviewed and adjusted.   Please request your Prim.MD to go over all Hospital Tests and Procedure/Radiological results at the follow up, please get all Hospital records sent to your Prim MD by signing hospital release before you go home.   If you experience worsening of your admission symptoms, develop shortness of breath, life threatening emergency, suicidal or homicidal thoughts you must seek medical attention immediately by calling 911 or calling your MD immediately  if symptoms less severe.  You Must read complete instructions/literature along with all the possible adverse reactions/side effects for all the Medicines you take and that have been prescribed to you. Take any new Medicines after you have completely understood and accpet all the possible adverse reactions/side effects.    Do not drive when taking Pain medications.    Do not take more than prescribed Pain, Sleep and Anxiety Medications  Special Instructions: If you have smoked or chewed Tobacco  in the last 2 yrs please stop smoking, stop any regular Alcohol  and or any Recreational drug use.  Wear  Seat belts while driving.   Please note  You were cared for by a hospitalist during your hospital stay. If you have any questions about your discharge medications or the care you received while you were in the hospital after you are discharged, you can call the unit and asked to speak with the hospitalist on call if the hospitalist that took care of you is not available. Once you are discharged, your primary care physician will handle any further medical issues. Please note that NO REFILLS for any discharge medications will be authorized once you are discharged, as it is imperative that you return to your primary care physician (or establish a relationship with a primary care physician if you do not have one) for your aftercare needs so that they can reassess your need for medications and monitor your lab values.   Increase activity slowly   Complete by: As directed       Allergies as of 11/03/2020       Reactions   Azathioprine Other (See Comments)   REACTION: affected WBC "Almost died"   Ciprofloxacin Other (See Comments)   Reaction not recalled   Levaquin [levofloxacin In D5w] Other (See Comments)   Reaction not rec   Plendil [felodipine] Other (See  Comments)   Reaction not not recalled        Medication List     TAKE these medications    acetaminophen 160 MG/5ML liquid Commonly known as: TYLENOL Take 10.2 mLs (325 mg total) by mouth every 6 (six) hours as needed for fever or pain.   allopurinol 100 MG tablet Commonly known as: ZYLOPRIM Take 2 tablets (200 mg total) by mouth daily.   apixaban 5 MG Tabs tablet Commonly known as: ELIQUIS Take 1 tablet (5 mg total) by mouth 2 (two) times daily.   budesonide 3 MG 24 hr capsule Commonly known as: ENTOCORT EC Take 3 capsules (9 mg total) by mouth daily.   Calcium 500 MG tablet Take 1 tablet (500 mg total) by mouth 1 day or 1 dose for 1 dose.   cephALEXin 250 MG capsule Commonly known as: KEFLEX Take 1 capsule (250 mg  total) by mouth 2 (two) times daily for 10 days.   dapagliflozin propanediol 10 MG Tabs tablet Commonly known as: Farxiga Take 1 tablet (10 mg total) by mouth daily before breakfast.   divalproex 500 MG 24 hr tablet Commonly known as: Depakote ER Take 3 tablets (1,500 mg total) by mouth at bedtime.   NUTRITIONAL SUPPLEMENT PO Take 1 Package by mouth daily. Magic Cup   feeding supplement Liqd Take 237 mLs by mouth 2 (two) times daily between meals.   ferrous sulfate 325 (65 FE) MG tablet Take 1 tablet (325 mg total) by mouth in the morning and at bedtime.   furosemide 20 MG tablet Commonly known as: LASIX Take 1 tablet (20 mg total) by mouth daily.   guaiFENesin 100 MG/5ML Soln Commonly known as: ROBITUSSIN Take 10 mLs (200 mg total) by mouth 3 (three) times daily for 14 days. What changed:  when to take this reasons to take this   hydrALAZINE 25 MG tablet Commonly known as: APRESOLINE Take 1 tablet (25 mg total) by mouth 3 (three) times daily as needed (High BP, SBP>130).   inFLIXimab 100 MG injection Commonly known as: Remicade Infuse Remicade IV schedule 1 72m/kg every 8 weeks Premedicate with Tylenol 500-6510mby mouth and Benadryl 25-5065my mouth prior to infusion. Last PPD was on 12/2009.   isosorbide mononitrate 60 MG 24 hr tablet Commonly known as: IMDUR Take 1 tablet (60 mg total) by mouth daily.   ivabradine 5 MG Tabs tablet Commonly known as: Corlanor TAKE 1 TABLET(5 MG) BY MOUTH TWICE DAILY WITH A MEAL What changed:  how much to take how to take this when to take this additional instructions   loperamide 2 MG capsule Commonly known as: IMODIUM Take 1 capsule (2 mg total) by mouth 3 (three) times daily as needed for diarrhea or loose stools.   Magnesium 400 MG Tabs Take 400 mg by mouth daily.   metoprolol succinate 25 MG 24 hr tablet Commonly known as: TOPROL-XL Take 1 tablet (25 mg total) by mouth in the morning and at bedtime.   mirtazapine  7.5 MG tablet Commonly known as: REMERON Take 1 tablet (7.5 mg total) by mouth at bedtime. TAKE 1 TABLET(7.5 MG) BY MOUTH AT BEDTIME What changed: additional instructions   multivitamin with minerals Tabs tablet Take 1 tablet by mouth daily.   omeprazole 20 MG capsule Commonly known as: PRILOSEC Take 1 capsule (20 mg total) by mouth daily.   polyethylene glycol 17 g packet Commonly known as: MIRALAX / GLYCOLAX Take 17 g by mouth daily as needed. Give Miralax with  6 oz of water by mouth as needed for constipation What changed:  reasons to take this additional instructions   QUEtiapine 100 MG tablet Commonly known as: SEROQUEL Take 1.5 tablets (150 mg total) by mouth at bedtime.   solifenacin 5 MG tablet Commonly known as: VESICARE Take 1 tablet (5 mg total) by mouth daily.   tamsulosin 0.4 MG Caps capsule Commonly known as: FLOMAX Take 1 capsule (0.4 mg total) by mouth daily.   vitamin B-12 500 MCG tablet Commonly known as: CYANOCOBALAMIN Take 1,000 mcg by mouth daily.   Vitamin D 125 MCG (5000 UT) Caps Take 5,000 Units by mouth daily.               Durable Medical Equipment  (From admission, onward)           Start     Ordered   11/03/20 1050  For home use only DME Walker rolling  Once       Question Answer Comment  Walker: With [place] Wheels   Patient needs a walker to treat with the following condition Weakness      11/03/20 [place] Follow up.   Contact information: [place] [address] [phone]         Wardell Honour, MD Follow up.   Specialties: Family Medicine, Emergency Medicine Contact information: [place] [address] [phone]                Allergies  Allergen Reactions   Azathioprine Other (See Comments)    REACTION: affected WBC "Almost died"   Ciprofloxacin Other (See Comments)    Reaction  not recalled   Levaquin [Levofloxacin In D5w] Other (See Comments)    Reaction not rec   Plendil [Felodipine] Other (See Comments)    Reaction not not recalled    Consultations: None   Procedures/Studies: CT ABDOMEN PELVIS WO CONTRAST  Result Date: 11/01/2020 CLINICAL DATA:  Diarrhea. Crohn's disease. Prior bowel resections. Sepsis. EXAM: CT ABDOMEN AND PELVIS WITHOUT CONTRAST TECHNIQUE: Multidetector CT imaging of the abdomen and pelvis was performed following the standard protocol without IV contrast. COMPARISON:  Multiple exams, including 05/31/2017 FINDINGS: Lower chest: Defibrillator lead terminates the right ventricular apex. Trace anterior pericardial effusion. Borderline cardiomegaly. Trace bilateral pleural effusions. Mild dependent subsegmental atelectasis in both lower lobes. Hepatobiliary: Cholecystectomy. Common bile duct approximately 1 cm in diameter, similar to prior exams, with prominence likely representing physiologic response to cholecystectomy. Pancreas: Unremarkable Spleen: Unremarkable Adrenals/Urinary Tract: 1.2 by 1.5 cm left adrenal nodule on image 27 series 3, currently 12 Hounsfield units which is indeterminate, but shown to be an adrenal adenoma on prior abdominal MRI of 05/05/2008. Right adrenal gland unremarkable. Scattered hypodense hepatic lesions are likely cysts, as characterized on multiple prior exams including the renal ultrasound of 03/23/2020. IV contrast was not administered on today's exam and accordingly the enhancement characteristics of these lesions are not interrogated. There is leftover contrast in the renal collecting systems and ureters bilaterally, likely related to the CT angiography exam performed yesterday. No hydronephrosis or hydroureter. Contrast medium is present in the urinary bladder. There is a right posterior bladder diverticulum. Because of the presence of the collecting system contrast, sensitivity for small nonobstructive renal calculi is  low. However, calculi have not been shown on prior exams. There is some subtle indistinctness of  the bladder adventitious/serosa which could reflect cystitis. Stomach/Bowel: Air-levels in the sigmoid colon and rectum suggesting diarrheal process. Suspected wall thickening in the lower rectum with some mild presacral stranding, suspicious for inflammation, although strictly speaking rectal tumor is not excluded. Prior partial right colectomy. No dilated small bowel. No extraluminal gas identified. Vascular/Lymphatic: Atherosclerosis is present, including aortoiliac atherosclerotic disease. Reproductive: Unremarkable Other: No supplemental non-categorized findings. Musculoskeletal: Lumbar spondylosis and degenerative disc disease with grade 1 degenerative anterolisthesis at L4-5. This causes mild to moderate right foraminal stenosis at L4-5 and mild left foraminal stenosis at L3-4, L4-5, L5-S1. IMPRESSION: 1. Distal colonic air-levels indicating diarrheal process. 2. Wall thickening in the lower rectum, with some mild perirectal stranding and presacral stranding. The appearance favors inflammation in the context of the patient's history. Lower rectal tumor is a less likely differential diagnostic consideration. 3. Please note that today's exam was performed without IV contrast, and accordingly some signs of active Crohn's disease such as mucosal enhancement might not be readily apparent. 4. Mild stranding around the margins of the urinary bladder which could reflect cystitis, correlate with urine analysis. No gas is evident in the urinary bladder. 5. Other imaging findings of potential clinical significance: Trace anterior pericardial effusion. Trace bilateral pleural effusions. Stable left adrenal adenoma. Bilateral renal cysts. Aortic Atherosclerosis (ICD10-I70.0). Multilevel lumbar impingement. Electronically Signed   By: Van Clines M.D.   On: 11/01/2020 10:58   DG Chest Portable 1 View  Result Date:  10/31/2020 CLINICAL DATA:  Confusion, slurred speech EXAM: PORTABLE CHEST 1 VIEW COMPARISON:  10/08/2020 FINDINGS: Left AICD remains in place, unchanged. Cardiomegaly, vascular congestion. No confluent opacities, effusions or overt edema. IMPRESSION: Cardiomegaly, vascular congestion. Electronically Signed   By: Rolm Baptise M.D.   On: 10/31/2020 20:39   DG Chest Portable 1 View  Result Date: 10/08/2020 CLINICAL DATA:  Weakness EXAM: PORTABLE CHEST 1 VIEW COMPARISON:  08/14/2020 FINDINGS: Left-sided pacing device. No focal opacity or pleural effusion. Normal cardiomediastinal silhouette. No pneumothorax IMPRESSION: No active disease. Electronically Signed   By: Donavan Foil M.D.   On: 10/08/2020 20:56   CT HEAD CODE STROKE WO CONTRAST  Result Date: 10/31/2020 CLINICAL DATA:  Code stroke.  Neuro deficit, acute, stroke suspected EXAM: CT HEAD WITHOUT CONTRAST TECHNIQUE: Contiguous axial images were obtained from the base of the skull through the vertex without intravenous contrast. COMPARISON:  July 2020 FINDINGS: Brain: There is no acute intracranial hemorrhage, mass effect, or edema. No new loss of gray-white differentiation. Prominence of the ventricles and sulci reflects parenchymal volume loss with mild progression since 2020. Patchy hypoattenuation in the supratentorial white matter is nonspecific but probably reflects mild to moderate chronic microvascular ischemic changes. No extra-axial collection. Vascular: No hyperdense vessel. Skull: Unremarkable. Sinuses/Orbits: Paranasal sinus mucosal thickening. No significant orbital abnormality. Other: Mastoid air cells are clear. ASPECTS ([place] Stroke Program Early CT Score) - Ganglionic level infarction (caudate, lentiform nuclei, internal capsule, insula, M1-M3 cortex): 7 - Supraganglionic infarction (M4-M6 cortex): 3 Total score (0-10 with 10 being normal): 10 IMPRESSION: There is no acute intracranial hemorrhage or evidence of acute infarction. ASPECT  score is 10. Chronic microvascular ischemic changes. These results were communicated to Dr. Rory Percy at 8:00 pm on 10/31/2020 by text page via the [facility] messaging system. Electronically Signed   By: Macy Mis M.D.   On: 10/31/2020 20:03   CT ANGIO HEAD NECK W WO CM (CODE STROKE)  Result Date: 10/31/2020 CLINICAL DATA:  Neuro deficit, acute, stroke suspected; abnormal speech EXAM:  CT ANGIOGRAPHY HEAD AND NECK TECHNIQUE: Multidetector CT imaging of the head and neck was performed using the standard protocol during bolus administration of intravenous contrast. Multiplanar CT image reconstructions and MIPs were obtained to evaluate the vascular anatomy. Carotid stenosis measurements (when applicable) are obtained utilizing NASCET criteria, using the distal internal carotid diameter as the denominator. CONTRAST:  1m OMNIPAQUE IOHEXOL 350 MG/ML SOLN COMPARISON:  None. FINDINGS: CTA NECK Aortic arch: Great vessel origins are patent. There is direct origin of the left vertebral artery from the arch. Right carotid system: Patent. Trace calcified plaque along the proximal internal carotid without stenosis. Left carotid system: Patent. Mild mixed plaque along the proximal internal carotid with minimal stenosis. Partially retropharyngeal course. Vertebral arteries: Patent. Right vertebral artery slightly dominant. No stenosis. Skeleton: Degenerative changes of the cervical spine. Other neck: Unremarkable. Upper chest: No apical lung mass. Review of the MIP images confirms the above findings CTA HEAD Anterior circulation: The intracranial internal carotid arteries are patent. Anterior and middle cerebral arteries are patent. Posterior circulation: Intracranial vertebral arteries are patent. Basilar artery is patent. Major cerebral artery origins are patent. The vertebrobasilar system is small in caliber secondary to bilateral posterior communicating arteries being the primary supply of the patent posterior cerebral  arteries. Venous sinuses: Patent as allowed by contrast bolus timing. Review of the MIP images confirms the above findings IMPRESSION: No large vessel occlusion or hemodynamically significant stenosis. Electronically Signed   By: PMacy MisM.D.   On: 10/31/2020 20:27      Subjective:  Reports he is feeling better today, no chest pain, no shortness of breath, no dizziness or lightheadedness. Discharge Exam: Vitals:   11/03/20 0500 11/03/20 0858  BP: (!) 147/86 (!) 146/76  Pulse: 68 71  Resp: 18 18  Temp:  98.2 F (36.8 C)  SpO2: 96% 98%   Vitals:   11/02/20 1554 11/02/20 2100 11/03/20 0500 11/03/20 0858  BP: 103/68 (!) 148/84 (!) 147/86 (!) 146/76  Pulse: 76 87 68 71  Resp: 14 18 18 18   Temp: 98.4 F (36.9 C) 98 F (36.7 C)  98.2 F (36.8 C)  TempSrc: Oral Oral  Oral  SpO2: 98% 100% 96% 98%  Weight:      Height:        General: Pt is alert, awake, not in acute distress Cardiovascular: RRR, S1/S2 +, no rubs, no gallops Respiratory: CTA bilaterally, no wheezing, no rhonchi Abdominal: Soft, NT, ND, bowel sounds + Extremities: no edema, no cyanosis    The results of significant diagnostics from this hospitalization (including imaging, microbiology, ancillary and laboratory) are listed below for reference.     Microbiology: Recent Results (from the past 240 hour(s))  Resp Panel by RT-PCR (Flu A&B, Covid) Nasopharyngeal Swab     Status: Abnormal   Collection Time: 10/31/20  7:46 PM   Specimen: Nasopharyngeal Swab; Nasopharyngeal(NP) swabs in vial transport medium  Result Value Ref Range Status   SARS Coronavirus 2 by RT PCR POSITIVE (A) NEGATIVE Final    Comment: RESULT CALLED TO, READ BACK BY AND VERIFIED WITH: RN TONY SINES 10/31/20@21 :48 BY TW (NOTE) SARS-CoV-2 target nucleic acids are DETECTED.  The SARS-CoV-2 RNA is generally detectable in upper respiratory specimens during the acute phase of infection. Positive results are indicative of the presence of the  identified virus, but do not rule out bacterial infection or co-infection with other pathogens not detected by the test. Clinical correlation with patient history and other diagnostic information is necessary to determine  patient infection status. The expected result is Negative.  Fact Sheet for Patients: EntrepreneurPulse.com.au  Fact Sheet for Healthcare Providers: IncredibleEmployment.be  This test is not yet approved or cleared by the [place] FDA and  has been authorized for detection and/or diagnosis of SARS-CoV-2 by FDA under an Emergency Use Authorization (EUA).  This EUA will remain in effect (meaning this test can be  used) for the duration of  the COVID-19 declaration under Section 564(b)(1) of the Act, 21 U.S.C. section 360bbb-3(b)(1), unless the authorization is terminated or revoked sooner.     Influenza A by PCR NEGATIVE NEGATIVE Final   Influenza B by PCR NEGATIVE NEGATIVE Final    Comment: (NOTE) The Xpert Xpress SARS-CoV-2/FLU/RSV plus assay is intended as an aid in the diagnosis of influenza from Nasopharyngeal swab specimens and should not be used as a sole basis for treatment. Nasal washings and aspirates are unacceptable for Xpert Xpress SARS-CoV-2/FLU/RSV testing.  Fact Sheet for Patients: EntrepreneurPulse.com.au  Fact Sheet for Healthcare Providers: IncredibleEmployment.be  This test is not yet approved or cleared by the [place] FDA and has been authorized for detection and/or diagnosis of SARS-CoV-2 by FDA under an Emergency Use Authorization (EUA). This EUA will remain in effect (meaning this test can be used) for the duration of the COVID-19 declaration under Section 564(b)(1) of the Act, 21 U.S.C. section 360bbb-3(b)(1), unless the authorization is terminated or revoked.  Performed at [facility], [place] [address], [place], [place] [address]   Blood culture  (routine x 2)     Status: None (Preliminary result)   Collection Time: 10/31/20  9:12 PM   Specimen: BLOOD  Result Value Ref Range Status   Specimen Description BLOOD RIGHT ANTECUBITAL  Final   Special Requests   Final    BOTTLES DRAWN AEROBIC AND ANAEROBIC Blood Culture adequate volume   Culture   Final    NO GROWTH 2 DAYS Performed at [facility], [place] [address], [place], [place] [address]    Report Status PENDING  Incomplete  Blood culture (routine x 2)     Status: None (Preliminary result)   Collection Time: 10/31/20  9:20 PM   Specimen: BLOOD RIGHT HAND  Result Value Ref Range Status   Specimen Description BLOOD RIGHT HAND  Final   Special Requests   Final    BOTTLES DRAWN AEROBIC AND ANAEROBIC Blood Culture adequate volume   Culture   Final    NO GROWTH 2 DAYS Performed at [facility], [place] [address] [place], [place] [address]    Report Status PENDING  Incomplete  Gastrointestinal Panel by PCR , Stool     Status: None   Collection Time: 11/01/20  6:11 AM   Specimen: Stool  Result Value Ref Range Status   Campylobacter species NOT DETECTED NOT DETECTED Final   Plesimonas shigelloides NOT DETECTED NOT DETECTED Final   Salmonella species NOT DETECTED NOT DETECTED Final   Yersinia enterocolitica NOT DETECTED NOT DETECTED Final   Vibrio species NOT DETECTED NOT DETECTED Final   Vibrio cholerae NOT DETECTED NOT DETECTED Final   Enteroaggregative E coli (EAEC) NOT DETECTED NOT DETECTED Final   Enteropathogenic E coli (EPEC) NOT DETECTED NOT DETECTED Final   Enterotoxigenic E coli (ETEC) NOT DETECTED NOT DETECTED Final   Shiga like toxin producing E coli (STEC) NOT DETECTED NOT DETECTED Final   Shigella/Enteroinvasive E coli (EIEC) NOT DETECTED NOT DETECTED Final   Cryptosporidium NOT DETECTED NOT DETECTED Final  Cyclospora cayetanensis NOT DETECTED NOT DETECTED Final   Entamoeba histolytica NOT DETECTED NOT DETECTED Final   Giardia lamblia NOT DETECTED NOT  DETECTED Final   Adenovirus F40/41 NOT DETECTED NOT DETECTED Final   Astrovirus NOT DETECTED NOT DETECTED Final   Norovirus GI/GII NOT DETECTED NOT DETECTED Final   Rotavirus A NOT DETECTED NOT DETECTED Final   Sapovirus (I, II, IV, and V) NOT DETECTED NOT DETECTED Final    Comment: Performed at [facility], [address], [place], [place] [address]  C Difficile Quick Screen w PCR reflex     Status: None   Collection Time: 11/01/20  6:11 AM   Specimen: Stool  Result Value Ref Range Status   C Diff antigen NEGATIVE NEGATIVE Final   C Diff toxin NEGATIVE NEGATIVE Final   C Diff interpretation No C. difficile detected.  Final    Comment: Performed at [facility], [place] [address] [place], [place] [address]     Labs: BNP (last 3 results) No results for input(s): BNP in the last 8760 hours. Basic Metabolic Panel: Recent Labs  Lab 10/31/20 1945 10/31/20 2021 11/01/20 0245 11/02/20 0442 11/03/20 0029  NA 137 142 140 142 142  K 3.3* 3.4* 3.4* 3.6 3.2*  CL 106 106 107 111 112*  CO2 24  --  23 22 22   GLUCOSE 102* 100* 125* 96 117*  BUN 28* 28* 27* 24* 24*  CREATININE 2.45* 2.50* 2.32* 2.11* 2.14*  CALCIUM 7.7*  --  7.8* 7.6* 7.3*   Liver Function Tests: Recent Labs  Lab 10/31/20 1945 11/01/20 0245  AST 25 23  ALT 26 27  ALKPHOS 81 80  BILITOT 0.9 0.8  PROT 6.0* 6.7  ALBUMIN 2.5* 2.6*   No results for input(s): LIPASE, AMYLASE in the last 168 hours. Recent Labs  Lab 10/31/20 2004  AMMONIA <9*   CBC: Recent Labs  Lab 10/31/20 1945 10/31/20 2021 11/01/20 0245 11/02/20 0442 11/03/20 0029  WBC 29.1*  --  29.6* 20.4* 13.7*  NEUTROABS 26.5*  --  27.2*  --   --   HGB 9.0* 9.9* 9.7* 8.4* 8.6*  HCT 28.6* 29.0* 31.1* 26.9* 27.7*  MCV 108.7*  --  110.7* 109.3* 109.1*  PLT 134*  --  141* 114* 126*   Cardiac Enzymes: No results for input(s): CKTOTAL, CKMB, CKMBINDEX, TROPONINI in the last 168 hours. BNP: Invalid input(s): POCBNP CBG: Recent Labs   Lab 10/31/20 1948  GLUCAP 106*   D-Dimer No results for input(s): DDIMER in the last 72 hours. Hgb A1c No results for input(s): HGBA1C in the last 72 hours. Lipid Profile No results for input(s): CHOL, HDL, LDLCALC, TRIG, CHOLHDL, LDLDIRECT in the last 72 hours. Thyroid function studies Recent Labs    11/01/20 0245  TSH 1.183   Anemia work up Recent Labs    10/31/20 2117  VITAMINB12 778   Urinalysis    Component Value Date/Time   COLORURINE YELLOW 10/31/2020 2004   APPEARANCEUR HAZY (A) 10/31/2020 2004   LABSPEC 1.038 (H) 10/31/2020 2004   PHURINE 5.0 10/31/2020 2004   GLUCOSEU NEGATIVE 10/31/2020 2004   HGBUR NEGATIVE 10/31/2020 2004   BILIRUBINUR NEGATIVE 10/31/2020 2004   BILIRUBINUR Positive 04/26/2020 [place] NEGATIVE 10/31/2020 2004   PROTEINUR 30 (A) 10/31/2020 2004   UROBILINOGEN negative (A) 04/26/2020 1504   UROBILINOGEN 0.2 07/10/2010 1709   NITRITE NEGATIVE 10/31/2020 2004   LEUKOCYTESUR SMALL (A) 10/31/2020 2004   Sepsis Labs Invalid input(s): PROCALCITONIN,  WBC,  LACTICIDVEN Microbiology Recent Results (from the past 240 hour(s))  Resp Panel by RT-PCR (Flu A&B, Covid) Nasopharyngeal Swab     Status: Abnormal   Collection Time: 10/31/20  7:46 PM   Specimen: Nasopharyngeal Swab; Nasopharyngeal(NP) swabs in vial transport medium  Result Value Ref Range Status   SARS Coronavirus 2 by RT PCR POSITIVE (A) NEGATIVE Final    Comment: RESULT CALLED TO, READ BACK BY AND VERIFIED WITH: RN TONY SINES 10/31/20@21 :48 BY TW (NOTE) SARS-CoV-2 target nucleic acids are DETECTED.  The SARS-CoV-2 RNA is generally detectable in upper respiratory specimens during the acute phase of infection. Positive results are indicative of the presence of the identified virus, but do not rule out bacterial infection or co-infection with other pathogens not detected by the test. Clinical correlation with patient history and other diagnostic information is necessary to  determine patient infection status. The expected result is Negative.  Fact Sheet for Patients: EntrepreneurPulse.com.au  Fact Sheet for Healthcare Providers: IncredibleEmployment.be  This test is not yet approved or cleared by the [place] FDA and  has been authorized for detection and/or diagnosis of SARS-CoV-2 by FDA under an Emergency Use Authorization (EUA).  This EUA will remain in effect (meaning this test can be  used) for the duration of  the COVID-19 declaration under Section 564(b)(1) of the Act, 21 U.S.C. section 360bbb-3(b)(1), unless the authorization is terminated or revoked sooner.     Influenza A by PCR NEGATIVE NEGATIVE Final   Influenza B by PCR NEGATIVE NEGATIVE Final    Comment: (NOTE) The Xpert Xpress SARS-CoV-2/FLU/RSV plus assay is intended as an aid in the diagnosis of influenza from Nasopharyngeal swab specimens and should not be used as a sole basis for treatment. Nasal washings and aspirates are unacceptable for Xpert Xpress SARS-CoV-2/FLU/RSV testing.  Fact Sheet for Patients: EntrepreneurPulse.com.au  Fact Sheet for Healthcare Providers: IncredibleEmployment.be  This test is not yet approved or cleared by the [place] FDA and has been authorized for detection and/or diagnosis of SARS-CoV-2 by FDA under an Emergency Use Authorization (EUA). This EUA will remain in effect (meaning this test can be used) for the duration of the COVID-19 declaration under Section 564(b)(1) of the Act, 21 U.S.C. section 360bbb-3(b)(1), unless the authorization is terminated or revoked.  Performed at [facility], [place] [address], [place], [place] [address]   Blood culture (routine x 2)     Status: None (Preliminary result)   Collection Time: 10/31/20  9:12 PM   Specimen: BLOOD  Result Value Ref Range Status   Specimen Description BLOOD RIGHT ANTECUBITAL  Final   Special  Requests   Final    BOTTLES DRAWN AEROBIC AND ANAEROBIC Blood Culture adequate volume   Culture   Final    NO GROWTH 2 DAYS Performed at [facility], [place] [address] [place], [place] [address]    Report Status PENDING  Incomplete  Blood culture (routine x 2)     Status: None (Preliminary result)   Collection Time: 10/31/20  9:20 PM   Specimen: BLOOD RIGHT HAND  Result Value Ref Range Status   Specimen Description BLOOD RIGHT HAND  Final   Special Requests   Final    BOTTLES DRAWN AEROBIC AND ANAEROBIC Blood Culture adequate volume   Culture   Final    NO GROWTH 2 DAYS Performed at [facility], [place] [address] [place], [place] [address]    Report Status PENDING  Incomplete  Gastrointestinal Panel  by PCR , Stool     Status: None   Collection Time: 11/01/20  6:11 AM   Specimen: Stool  Result Value Ref Range Status   Campylobacter species NOT DETECTED NOT DETECTED Final   Plesimonas shigelloides NOT DETECTED NOT DETECTED Final   Salmonella species NOT DETECTED NOT DETECTED Final   Yersinia enterocolitica NOT DETECTED NOT DETECTED Final   Vibrio species NOT DETECTED NOT DETECTED Final   Vibrio cholerae NOT DETECTED NOT DETECTED Final   Enteroaggregative E coli (EAEC) NOT DETECTED NOT DETECTED Final   Enteropathogenic E coli (EPEC) NOT DETECTED NOT DETECTED Final   Enterotoxigenic E coli (ETEC) NOT DETECTED NOT DETECTED Final   Shiga like toxin producing E coli (STEC) NOT DETECTED NOT DETECTED Final   Shigella/Enteroinvasive E coli (EIEC) NOT DETECTED NOT DETECTED Final   Cryptosporidium NOT DETECTED NOT DETECTED Final   Cyclospora cayetanensis NOT DETECTED NOT DETECTED Final   Entamoeba histolytica NOT DETECTED NOT DETECTED Final   Giardia lamblia NOT DETECTED NOT DETECTED Final   Adenovirus F40/41 NOT DETECTED NOT DETECTED Final   Astrovirus NOT DETECTED NOT DETECTED Final   Norovirus GI/GII NOT DETECTED NOT DETECTED Final   Rotavirus A NOT DETECTED NOT DETECTED  Final   Sapovirus (I, II, IV, and V) NOT DETECTED NOT DETECTED Final    Comment: Performed at [facility], [place] [place], [place] [address]  C Difficile Quick Screen w PCR reflex     Status: None   Collection Time: 11/01/20  6:11 AM   Specimen: Stool  Result Value Ref Range Status   C Diff antigen NEGATIVE NEGATIVE Final   C Diff toxin NEGATIVE NEGATIVE Final   C Diff interpretation No C. difficile detected.  Final    Comment: Performed at [facility], [place] [address] [place], [place] [address]     Time coordinating discharge: Over 30 minutes  SIGNED:   Phillips Climes, MD  [facility] 11/03/2020, 1:20 PM Pager   If 7PM-7AM, please contact night-coverage [email or web address] Password [email or web address]

## 2020-11-05 ENCOUNTER — Other Ambulatory Visit: Payer: Self-pay

## 2020-11-05 ENCOUNTER — Telehealth: Payer: Self-pay | Admitting: *Deleted

## 2020-11-05 DIAGNOSIS — E1122 Type 2 diabetes mellitus with diabetic chronic kidney disease: Secondary | ICD-10-CM

## 2020-11-05 DIAGNOSIS — I10 Essential (primary) hypertension: Secondary | ICD-10-CM

## 2020-11-05 DIAGNOSIS — N1832 Chronic kidney disease, stage 3b: Secondary | ICD-10-CM

## 2020-11-05 DIAGNOSIS — I5022 Chronic systolic (congestive) heart failure: Secondary | ICD-10-CM

## 2020-11-05 LAB — CULTURE, BLOOD (ROUTINE X 2)
Culture: NO GROWTH
Culture: NO GROWTH
Special Requests: ADEQUATE
Special Requests: ADEQUATE

## 2020-11-05 NOTE — Patient Outreach (Signed)
[facility]) Care Management  11/05/2020  BUBBER ROTHERT 10-23-1945 [id number]  [facility] [ACO] Patient: [employer]  Primary Care Provider: Wardell Honour, MD [facility]  Patient was assessed for [employer] [facility] Management for community services. Patient was previously active with [facility].  Spoke with patient via phone, HIPPA verified regarding being restarted with [facility] services.   Patient states his home health will continue and he recently left a nursing facility but having food resource issues until [place] [facility] [employer] get it start, states, "I'm on a waiting list and could really use the help with food." Plan:  Will send a request for [facility] Care guides for assistance.  Of note, [facility] Care Management services does not replace or interfere with any services that are arranged by inpatient [facility] care management team.   For additional questions or referrals please contact:  Natividad Brood, RN BSN [facility] Liaison  [phone] business mobile phone Toll free office [phone]  Fax number: [phone] [email or web address]

## 2020-11-05 NOTE — Telephone Encounter (Signed)
Transition Care Management Follow-up Telephone Call Date of discharge and from where: 11/03/2020 [place] How have you been since you were released from the hospital? [facility] Any questions or concerns? No  Items Reviewed: Did the pt receive and understand the discharge instructions provided? Yes  Medications obtained and verified? Yes  Other? No  Any new allergies since your discharge? No  Dietary orders reviewed? Yes Do you have support at home? Yes   Home Care and Equipment/Supplies: Were home health services ordered? yes If so, what is the name of the agency? [facility]  Has the agency set up a time to come to the patient's home? yes Were any new equipment or medical supplies ordered?  No What is the name of the medical supply agency? na Were you able to get the supplies/equipment? not applicable Do you have any questions related to the use of the equipment or supplies? No  Functional Questionnaire: (I = Independent and D = Dependent) ADLs: I with assistance [place]  Bathing/Dressing- I  Meal Prep- D  Eating- I  Maintaining continence- I  Transferring/Ambulation- I with assistance  Managing Meds- I  Follow up appointments reviewed:  PCP Hospital f/u appt confirmed? Yes  Scheduled to see Dinah  on 11/06/2020 @ 2:45. [facility] f/u appt confirmed? No   Are transportation arrangements needed? No  If their condition worsens, is the pt aware to call PCP or go to the Emergency Dept.? Yes Was the patient provided with contact information for the PCP's office or ED? Yes Was to pt encouraged to call back with questions or concerns? Yes

## 2020-11-06 ENCOUNTER — Encounter: Payer: Self-pay | Admitting: Family

## 2020-11-06 ENCOUNTER — Ambulatory Visit (INDEPENDENT_AMBULATORY_CARE_PROVIDER_SITE_OTHER): Payer: Medicare Other | Admitting: Family

## 2020-11-06 ENCOUNTER — Other Ambulatory Visit: Payer: Self-pay

## 2020-11-06 VITALS — BP 140/90 | HR 107 | Temp 97.3°F | Resp 16 | Ht 75.89 in | Wt 172.6 lb

## 2020-11-06 DIAGNOSIS — I5022 Chronic systolic (congestive) heart failure: Secondary | ICD-10-CM | POA: Diagnosis not present

## 2020-11-06 DIAGNOSIS — I10 Essential (primary) hypertension: Secondary | ICD-10-CM

## 2020-11-06 DIAGNOSIS — E1122 Type 2 diabetes mellitus with diabetic chronic kidney disease: Secondary | ICD-10-CM

## 2020-11-06 DIAGNOSIS — J449 Chronic obstructive pulmonary disease, unspecified: Secondary | ICD-10-CM | POA: Diagnosis not present

## 2020-11-06 DIAGNOSIS — Z961 Presence of intraocular lens: Secondary | ICD-10-CM | POA: Diagnosis not present

## 2020-11-06 DIAGNOSIS — D539 Nutritional anemia, unspecified: Secondary | ICD-10-CM | POA: Diagnosis not present

## 2020-11-06 DIAGNOSIS — I480 Paroxysmal atrial fibrillation: Secondary | ICD-10-CM | POA: Diagnosis not present

## 2020-11-06 DIAGNOSIS — D72829 Elevated white blood cell count, unspecified: Secondary | ICD-10-CM

## 2020-11-06 DIAGNOSIS — N1832 Chronic kidney disease, stage 3b: Secondary | ICD-10-CM | POA: Diagnosis not present

## 2020-11-06 NOTE — Progress Notes (Signed)
Provider: Marlowe Sax FNP-C  Ladelle Teodoro, [name], NP  Patient Care Team: Wynona Duhamel, [name], NP as PCP - General (Family Medicine) [place], Peter M, MD as PCP - Cardiology (Cardiology) Larey Dresser, MD as PCP - Advanced Heart Failure (Cardiology) Elmarie Shiley, MD (Nephrology) Milus Banister, MD (Gastroenterology) Jorge Ny, LCSW as Social Worker (Licensed Clinical Social Worker)  Extended Emergency Contact Information Primary Emergency Contact: [place] Phone: [phone] Mobile Phone: [phone] Relation: Sister Secondary Emergency Contact: [facility] Mobile Phone: [phone] Relation: Friend  Code Status:  DNR Goals of care: Advanced Directive information Advanced Directives 11/06/2020  Does Patient Have a Medical Advance Directive? Yes  Type of Paramedic of [place];Living will  Does patient want to make changes to medical advance directive? No - Patient declined  Copy of [place] in Chart? Yes - validated most recent copy scanned in chart (See row information)  Would patient like information on creating a medical advance directive? -  Pre-existing out of facility DNR order (yellow form or pink MOST form) -     Chief Complaint  Patient presents with   Transitions Of Care    TOC 10/31/2020 to 11/03/2020    HPI:  Pt is a 75 y.o. male seen today for an acute visit for transition of care post hospital admission from 10/31/2020 - 11/03/2020 with confusion,slurred speech and diarrhea described as watery and multiple episodes.Also had productive cough x 1 week. In the ED he had fever 100.3 WBC was 29.1 and 13.7 on discharge.He was admitted for sepsis. Hgb 8.6  friend schedule for infusion  CR 2.111 BUN 24 urine specimen was positive for leukocytes,pyuria and evidence of bladder inflammation on imaging.No urine culture was send.he was treated on broad spectrum antibiotics due to immunocompromised due to crohn's  disease.discharged on oral Keflex 5 days to complete a total of 7 days. CT abdomen and pelvis showed significant inflammation around the rectal area and possible procitis but improved on antibiotics.No indication for steroids due to improvement of diarrhea.  CXR showed vascular congestion was closely monitor.His home medication was continued.  He states feels much better since hospital admission.no worsening edema of lower extremities He denies any chest pain, shortness of breath , fatigue, palpitations,weakness, presyncope, syncope, orthopnea, and PND.   Past Medical History:  Diagnosis Date   Anemia    Anxiety    Benign paroxysmal positional vertigo    Bipolar I disorder, most recent episode (or current) unspecified    Celiac disease    CHF (congestive heart failure) (HCC)    Chronic kidney disease, stage III (moderate) (HCC)    Crohn's    Remicade q8 weeks   Depression    Essential and other specified forms of tremor    medication-induced Parkinson's, now resolved   Essential hypertension, benign    Gout 2018   Heart failure ([place])    Hypertrophy of prostate without urinary obstruction and other lower urinary tract symptoms (LUTS)    Impotence of organic origin    Insomnia with sleep apnea, unspecified    Iron deficiency anemia, unspecified    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   Neuralgia, neuritis, and radiculitis, unspecified    Other B-complex deficiencies    Other extrapyramidal disease and abnormal movement disorder    Postinflammatory pulmonary fibrosis ([place])    Tobacco use disorder    Vertigo 2018   Past Surgical History:  Procedure Laterality Date   CATARACT EXTRACTION, BILATERAL Bilateral 09/2018   CHOLECYSTECTOMY  07-12-2010  ICD IMPLANT N/A 01/02/2020   Procedure: ICD IMPLANT;  Surgeon: Evans Lance, MD;  Location: [facility];  Service: Cardiovascular;  Laterality: N/A;   RIGHT HEART CATH N/A 10/12/2018   Procedure: RIGHT HEART CATH;  Surgeon: Jolaine Artist,  MD;  Location: [facility];  Service: Cardiovascular;  Laterality: N/A;   RIGHT/LEFT HEART CATH AND CORONARY ANGIOGRAPHY N/A 04/26/2019   Procedure: RIGHT/LEFT HEART CATH AND CORONARY ANGIOGRAPHY;  Surgeon: Larey Dresser, MD;  Location: [facility];  Service: Cardiovascular;  Laterality: N/A;   SMALL INTESTINE SURGERY     x 2    Allergies  Allergen Reactions   Azathioprine Other (See Comments)    REACTION: affected WBC "Almost died"   Ciprofloxacin Other (See Comments)    Reaction not recalled   Levaquin [Levofloxacin In D5w] Other (See Comments)    Reaction not rec   Plendil [Felodipine] Other (See Comments)    Reaction not not recalled    Outpatient Encounter Medications as of 11/06/2020  Medication Sig   acetaminophen (TYLENOL) 160 MG/5ML liquid Take 10.2 mLs (325 mg total) by mouth every 6 (six) hours as needed for fever or pain.   allopurinol (ZYLOPRIM) 100 MG tablet Take 2 tablets (200 mg total) by mouth daily.   apixaban (ELIQUIS) 5 MG TABS tablet Take 1 tablet (5 mg total) by mouth 2 (two) times daily.   budesonide (ENTOCORT EC) 3 MG 24 hr capsule Take 3 capsules (9 mg total) by mouth daily.   Calcium 500 MG tablet Take 1 tablet (500 mg total) by mouth 1 day or 1 dose for 1 dose.   cephALEXin (KEFLEX) 250 MG capsule Take 1 capsule (250 mg total) by mouth 2 (two) times daily for 10 days.   Cholecalciferol (VITAMIN D) 125 MCG (5000 UT) CAPS Take 5,000 Units by mouth daily.   dapagliflozin propanediol (FARXIGA) 10 MG TABS tablet Take 1 tablet (10 mg total) by mouth daily before breakfast.   divalproex (DEPAKOTE ER) 500 MG 24 hr tablet Take 3 tablets (1,500 mg total) by mouth at bedtime.   feeding supplement (ENSURE ENLIVE / ENSURE PLUS) LIQD Take 237 mLs by mouth 2 (two) times daily between meals.   ferrous sulfate 325 (65 FE) MG tablet Take 1 tablet (325 mg total) by mouth in the morning and at bedtime.   furosemide (LASIX) 20 MG tablet Take 1 tablet (20 mg total)  by mouth daily.   guaifenesin (ROBITUSSIN) 100 MG/5ML syrup Take 10 mLs by mouth 3 (three) times daily as needed for cough.   hydrALAZINE (APRESOLINE) 25 MG tablet Take 1 tablet (25 mg total) by mouth 3 (three) times daily as needed (High BP, SBP>130).   inFLIXimab (REMICADE) 100 MG injection Infuse Remicade IV schedule 1 48m/kg every 8 weeks Premedicate with Tylenol 500-6569mby mouth and Benadryl 25-5022my mouth prior to infusion. Last PPD was on 12/2009.    isosorbide mononitrate (IMDUR) 60 MG 24 hr tablet Take 1 tablet (60 mg total) by mouth daily.   ivabradine (CORLANOR) 5 MG TABS tablet TAKE 1 TABLET(5 MG) BY MOUTH TWICE DAILY WITH A MEAL   loperamide (IMODIUM) 2 MG capsule Take 1 capsule (2 mg total) by mouth 3 (three) times daily as needed for diarrhea or loose stools.   Magnesium 400 MG TABS Take 400 mg by mouth daily.   metoprolol succinate (TOPROL-XL) 25 MG 24 hr tablet Take 1 tablet (25 mg total) by mouth in the morning and  at bedtime.   mirtazapine (REMERON) 7.5 MG tablet Take 1 tablet (7.5 mg total) by mouth at bedtime. TAKE 1 TABLET(7.5 MG) BY MOUTH AT BEDTIME   Multiple Vitamin (MULTIVITAMIN WITH MINERALS) TABS tablet Take 1 tablet by mouth daily.   Nutritional Supplements (NUTRITIONAL SUPPLEMENT PO) Take 1 Package by mouth daily. Magic Cup   omeprazole (PRILOSEC) 20 MG capsule Take 1 capsule (20 mg total) by mouth daily.   polyethylene glycol (MIRALAX / GLYCOLAX) 17 g packet Take 17 g by mouth daily as needed. Give Miralax with 6 oz of water by mouth as needed for constipation   QUEtiapine (SEROQUEL) 100 MG tablet Take 1.5 tablets (150 mg total) by mouth at bedtime.   solifenacin (VESICARE) 5 MG tablet Take 1 tablet (5 mg total) by mouth daily.   tamsulosin (FLOMAX) 0.4 MG CAPS capsule Take 1 capsule (0.4 mg total) by mouth daily.   vitamin B-12 (CYANOCOBALAMIN) 500 MCG tablet Take 1,000 mcg by mouth daily.    [DISCONTINUED] guaiFENesin (ROBITUSSIN) 100 MG/5ML SOLN Take 10 mLs  (200 mg total) by mouth 3 (three) times daily for 14 days. (Patient taking differently: Take 10 mLs by mouth 3 (three) times daily as needed for cough or to loosen phlegm.)   No facility-administered encounter medications on file as of 11/06/2020.    Review of Systems  Constitutional:  Negative for appetite change, chills, fatigue and fever.  HENT:  Negative for congestion, dental problem, ear discharge, ear pain, facial swelling, hearing loss, nosebleeds, postnasal drip, rhinorrhea, sinus pressure, sinus pain, sneezing, sore throat, tinnitus and trouble swallowing.   Eyes:  Negative for pain, discharge, redness, itching and visual disturbance.  Respiratory:  Positive for cough. Negative for chest tightness, shortness of breath and wheezing.   Cardiovascular:  Negative for chest pain, palpitations and leg swelling.  Gastrointestinal:  Negative for abdominal distention, abdominal pain, constipation, diarrhea, nausea and vomiting.  Endocrine: Negative for cold intolerance, heat intolerance, polydipsia, polyphagia and polyuria.  Genitourinary:  Negative for difficulty urinating, dysuria, flank pain, frequency and urgency.  Musculoskeletal:  Positive for gait problem. Negative for arthralgias, back pain, joint swelling, myalgias, neck pain and neck stiffness.  Skin:  Negative for color change, pallor, rash and wound.  Neurological:  Negative for dizziness, syncope, speech difficulty, weakness, light-headedness, numbness and headaches.  Hematological:  Does not bruise/bleed easily.  Psychiatric/Behavioral:  Negative for agitation, behavioral problems, confusion, hallucinations, self-injury, sleep disturbance and suicidal ideas. The patient is not nervous/anxious.    Immunization History  Administered Date(s) Administered   Fluad Quad(high Dose 65+) 11/15/2018, 11/17/2019   H1N1 02/07/2008   Influenza, High Dose Seasonal PF 11/26/2016, 12/15/2017   Influenza, Seasonal, Injecte, Preservative Fre  12/12/2008, 11/07/2009, 11/04/2010   Influenza,inj,Quad PF,6+ Mos 11/13/2014, 01/03/2016   Influenza-Unspecified 01/11/1997, 12/15/1997, 03/02/2000, 01/02/2003, 12/18/2003, 12/30/2004, 12/10/2005, 12/25/2005, 01/11/2007, 02/07/2008, 12/08/2011, 10/25/2012, 12/16/2012, 12/09/2013, 11/28/2014, 12/31/2015, 11/24/2016, 10/29/2017, 11/24/2017, 12/15/2017, 11/25/2018   Moderna Sars-Covid-2 Vaccination 04/20/2019, 05/18/2019, 10/25/2019   PPD Test 12/30/2010, 01/05/2012, 01/07/2013, 01/13/2014   Pneumococcal Conjugate-13 01/03/2014, 08/10/2014   Pneumococcal Polysaccharide-23 03/02/2000, 03/05/2004, 03/27/2005, 01/09/2015, 01/03/2016, 05/06/2018   Pneumococcal-Unspecified 12/25/2005   Tdap 05/12/2011, 05/26/2011   Zoster Recombinat (Shingrix) 05/13/2018, 04/01/2019   Pertinent  Health Maintenance Due  Topic Date Due   HEMOGLOBIN A1C  02/27/2020   FOOT EXAM  08/09/2020   INFLUENZA VACCINE  09/24/2020   OPHTHALMOLOGY EXAM  06/28/2021   COLONOSCOPY (Pts 45-59yr Insurance coverage will need to be confirmed)  09/17/2027   PNA vac Low Risk Adult  Completed   Fall Risk  11/06/2020 07/26/2020 04/26/2020 02/02/2020 01/24/2020  Falls in the past year? 1 0 0 0 0  Number falls in past yr: 1 0 0 0 0  Injury with Fall? 1 0 0 0 0  Risk for fall due to : History of fall(s) - - - -  Risk for fall due to: Comment - - - - -  Follow up Falls evaluation completed - - - -   Functional Status Survey:    Vitals:   11/06/20 1124 11/06/20 1215  BP: (!) 160/100 140/90  Pulse: (!) 107   Resp: 16   Temp: (!) 97.3 F (36.3 C)   SpO2: 96%   Weight: 172 lb 9.6 oz (78.3 kg)   Height: 6' 3.89" (1.928 m)    Body mass index is 21.07 kg/m. Physical Exam Vitals reviewed.  Constitutional:      General: He is not in acute distress.    Appearance: Normal appearance. He is normal weight. He is not ill-appearing or diaphoretic.  HENT:     Mouth/Throat:     Mouth: Mucous membranes are moist.     Pharynx: Oropharynx is  clear. No oropharyngeal exudate or posterior oropharyngeal erythema.  Eyes:     General: No scleral icterus.       Right eye: No discharge.        Left eye: No discharge.     Extraocular Movements: Extraocular movements intact.     Conjunctiva/sclera: Conjunctivae normal.     Pupils: Pupils are equal, round, and reactive to light.  Neck:     Vascular: No carotid bruit.  Cardiovascular:     Rate and Rhythm: Normal rate and regular rhythm.     Pulses: Normal pulses.     Heart sounds: Normal heart sounds. No murmur heard.   No friction rub. No gallop.  Pulmonary:     Effort: Pulmonary effort is normal. No respiratory distress.     Breath sounds: Normal breath sounds. No wheezing, rhonchi or rales.  Chest:     Chest wall: No tenderness.  Abdominal:     General: Bowel sounds are normal. There is no distension.     Palpations: Abdomen is soft. There is no mass.     Tenderness: There is no abdominal tenderness. There is no right CVA tenderness, left CVA tenderness, guarding or rebound.  Musculoskeletal:        General: No swelling or tenderness. Normal range of motion.     Cervical back: Normal range of motion. No rigidity or tenderness.     Right lower leg: No edema.     Left lower leg: No edema.  Lymphadenopathy:     Cervical: No cervical adenopathy.  Skin:    General: Skin is warm and dry.     Coloration: Skin is not pale.     Findings: No bruising, erythema, lesion or rash.  Neurological:     Mental Status: He is alert and oriented to person, place, and time.     Cranial Nerves: No cranial nerve deficit.     Sensory: No sensory deficit.     Motor: No weakness.     Coordination: Coordination normal.     Gait: Gait normal.  Psychiatric:        Mood and Affect: Mood normal.        Speech: Speech normal.        Behavior: Behavior normal.        Thought Content: Thought content normal.  Judgment: Judgment normal.    Labs reviewed: Recent Labs    10/09/20 0435  10/10/20 0154 10/11/20 0342 10/12/20 0650 11/01/20 0245 11/02/20 0442 11/03/20 0029  NA 141 142 144   < > 140 142 142  K 3.3* 3.9 3.9   < > 3.4* 3.6 3.2*  CL 111 114* 109   < > 107 111 112*  CO2 _0 < > _1 GLUCOSE 94 119* 101*   < > 125* 96 117*  BUN 27* 22 19   < > 27* 24* 24*  CREATININE 2.06* 1.81* 1.92*   < > 2.32* 2.11* 2.14*  CALCIUM 8.5* 8.5* 8.9   < > 7.8* 7.6* 7.3*  MG 1.7 1.4* 2.0  --   --   --   --   PHOS  --  1.6* 3.8  --   --   --   --    < > = values in this interval not displayed.   Recent Labs    10/10/20 0154 10/31/20 1945 11/01/20 0245  AST 11* 25 23  ALT _2 ALKPHOS 49 81 80  BILITOT 0.7 0.9 0.8  PROT 5.8* 6.0* 6.7  ALBUMIN 2.2* 2.5* 2.6*   Recent Labs    10/10/20 0154 10/11/20 0342 10/31/20 1945 10/31/20 2021 11/01/20 0245 11/02/20 0442 11/03/20 0029  WBC 7.7   < > 29.1*  --  29.6* 20.4* 13.7*  NEUTROABS 5.0  --  26.5*  --  27.2*  --   --   HGB 10.0*   < > 9.0*   < > 9.7* 8.4* 8.6*  HCT 31.7*   < > 28.6*   < > 31.1* 26.9* 27.7*  MCV 104.3*   < > 108.7*  --  110.7* 109.3* 109.1*  PLT 155   < > 134*  --  141* 114* 126*   < > = values in this interval not displayed.   Lab Results  Component Value Date   TSH 1.183 11/01/2020   Lab Results  Component Value Date   HGBA1C 5.1 08/27/2019   Lab Results  Component Value Date   CHOL 161 10/20/2019   HDL 54 10/20/2019   LDLCALC 86 10/20/2019   TRIG 112 10/20/2019   CHOLHDL 3.0 10/20/2019    Significant Diagnostic Results in last 30 days:  CT ABDOMEN PELVIS WO CONTRAST  Result Date: 11/01/2020 CLINICAL DATA:  Diarrhea. Crohn's disease. Prior bowel resections. Sepsis. EXAM: CT ABDOMEN AND PELVIS WITHOUT CONTRAST TECHNIQUE: Multidetector CT imaging of the abdomen and pelvis was performed following the standard protocol without IV contrast. COMPARISON:  Multiple exams, including 05/31/2017 FINDINGS: Lower chest: Defibrillator lead terminates the right ventricular apex. Trace  anterior pericardial effusion. Borderline cardiomegaly. Trace bilateral pleural effusions. Mild dependent subsegmental atelectasis in both lower lobes. Hepatobiliary: Cholecystectomy. Common bile duct approximately 1 cm in diameter, similar to prior exams, with prominence likely representing physiologic response to cholecystectomy. Pancreas: Unremarkable Spleen: Unremarkable Adrenals/Urinary Tract: 1.2 by 1.5 cm left adrenal nodule on image 27 series 3, currently 12 Hounsfield units which is indeterminate, but shown to be an adrenal adenoma on prior abdominal MRI of 05/05/2008. Right adrenal gland unremarkable. Scattered hypodense hepatic lesions are likely cysts, as characterized on multiple prior exams including the renal ultrasound of 03/23/2020. IV contrast was not administered on today's exam and accordingly the enhancement characteristics of these lesions are not interrogated. There is leftover contrast in the renal collecting systems and ureters bilaterally, likely related  to the CT angiography exam performed yesterday. No hydronephrosis or hydroureter. Contrast medium is present in the urinary bladder. There is a right posterior bladder diverticulum. Because of the presence of the collecting system contrast, sensitivity for small nonobstructive renal calculi is low. However, calculi have not been shown on prior exams. There is some subtle indistinctness of the bladder adventitious/serosa which could reflect cystitis. Stomach/Bowel: Air-levels in the sigmoid colon and rectum suggesting diarrheal process. Suspected wall thickening in the lower rectum with some mild presacral stranding, suspicious for inflammation, although strictly speaking rectal tumor is not excluded. Prior partial right colectomy. No dilated small bowel. No extraluminal gas identified. Vascular/Lymphatic: Atherosclerosis is present, including aortoiliac atherosclerotic disease. Reproductive: Unremarkable Other: No supplemental  non-categorized findings. Musculoskeletal: Lumbar spondylosis and degenerative disc disease with grade 1 degenerative anterolisthesis at L4-5. This causes mild to moderate right foraminal stenosis at L4-5 and mild left foraminal stenosis at L3-4, L4-5, L5-S1. IMPRESSION: 1. Distal colonic air-levels indicating diarrheal process. 2. Wall thickening in the lower rectum, with some mild perirectal stranding and presacral stranding. The appearance favors inflammation in the context of the patient's history. Lower rectal tumor is a less likely differential diagnostic consideration. 3. Please note that today's exam was performed without IV contrast, and accordingly some signs of active Crohn's disease such as mucosal enhancement might not be readily apparent. 4. Mild stranding around the margins of the urinary bladder which could reflect cystitis, correlate with urine analysis. No gas is evident in the urinary bladder. 5. Other imaging findings of potential clinical significance: Trace anterior pericardial effusion. Trace bilateral pleural effusions. Stable left adrenal adenoma. Bilateral renal cysts. Aortic Atherosclerosis (ICD10-I70.0). Multilevel lumbar impingement. Electronically Signed   By: Van Clines M.D.   On: 11/01/2020 10:58   DG Chest Portable 1 View  Result Date: 10/31/2020 CLINICAL DATA:  Confusion, slurred speech EXAM: PORTABLE CHEST 1 VIEW COMPARISON:  10/08/2020 FINDINGS: Left AICD remains in place, unchanged. Cardiomegaly, vascular congestion. No confluent opacities, effusions or overt edema. IMPRESSION: Cardiomegaly, vascular congestion. Electronically Signed   By: Rolm Baptise M.D.   On: 10/31/2020 20:39   DG Chest Portable 1 View  Result Date: 10/08/2020 CLINICAL DATA:  Weakness EXAM: PORTABLE CHEST 1 VIEW COMPARISON:  08/14/2020 FINDINGS: Left-sided pacing device. No focal opacity or pleural effusion. Normal cardiomediastinal silhouette. No pneumothorax IMPRESSION: No active disease.  Electronically Signed   By: Donavan Foil M.D.   On: 10/08/2020 20:56   CT HEAD CODE STROKE WO CONTRAST  Result Date: 10/31/2020 CLINICAL DATA:  Code stroke.  Neuro deficit, acute, stroke suspected EXAM: CT HEAD WITHOUT CONTRAST TECHNIQUE: Contiguous axial images were obtained from the base of the skull through the vertex without intravenous contrast. COMPARISON:  July 2020 FINDINGS: Brain: There is no acute intracranial hemorrhage, mass effect, or edema. No new loss of gray-white differentiation. Prominence of the ventricles and sulci reflects parenchymal volume loss with mild progression since 2020. Patchy hypoattenuation in the supratentorial white matter is nonspecific but probably reflects mild to moderate chronic microvascular ischemic changes. No extra-axial collection. Vascular: No hyperdense vessel. Skull: Unremarkable. Sinuses/Orbits: Paranasal sinus mucosal thickening. No significant orbital abnormality. Other: Mastoid air cells are clear. ASPECTS ([place] Stroke Program Early CT Score) - Ganglionic level infarction (caudate, lentiform nuclei, internal capsule, insula, M1-M3 cortex): 7 - Supraganglionic infarction (M4-M6 cortex): 3 Total score (0-10 with 10 being normal): 10 IMPRESSION: There is no acute intracranial hemorrhage or evidence of acute infarction. ASPECT score is 10. Chronic microvascular ischemic changes. These results  were communicated to Dr. Rory Percy at 8:00 pm on 10/31/2020 by text page via the [facility] messaging system. Electronically Signed   By: Macy Mis M.D.   On: 10/31/2020 20:03   CT ANGIO HEAD NECK W WO CM (CODE STROKE)  Result Date: 10/31/2020 CLINICAL DATA:  Neuro deficit, acute, stroke suspected; abnormal speech EXAM: CT ANGIOGRAPHY HEAD AND NECK TECHNIQUE: Multidetector CT imaging of the head and neck was performed using the standard protocol during bolus administration of intravenous contrast. Multiplanar CT image reconstructions and MIPs were obtained to evaluate the  vascular anatomy. Carotid stenosis measurements (when applicable) are obtained utilizing NASCET criteria, using the distal internal carotid diameter as the denominator. CONTRAST:  73m OMNIPAQUE IOHEXOL 350 MG/ML SOLN COMPARISON:  None. FINDINGS: CTA NECK Aortic arch: Great vessel origins are patent. There is direct origin of the left vertebral artery from the arch. Right carotid system: Patent. Trace calcified plaque along the proximal internal carotid without stenosis. Left carotid system: Patent. Mild mixed plaque along the proximal internal carotid with minimal stenosis. Partially retropharyngeal course. Vertebral arteries: Patent. Right vertebral artery slightly dominant. No stenosis. Skeleton: Degenerative changes of the cervical spine. Other neck: Unremarkable. Upper chest: No apical lung mass. Review of the MIP images confirms the above findings CTA HEAD Anterior circulation: The intracranial internal carotid arteries are patent. Anterior and middle cerebral arteries are patent. Posterior circulation: Intracranial vertebral arteries are patent. Basilar artery is patent. Major cerebral artery origins are patent. The vertebrobasilar system is small in caliber secondary to bilateral posterior communicating arteries being the primary supply of the patent posterior cerebral arteries. Venous sinuses: Patent as allowed by contrast bolus timing. Review of the MIP images confirms the above findings IMPRESSION: No large vessel occlusion or hemodynamically significant stenosis. Electronically Signed   By: PMacy MisM.D.   On: 10/31/2020 20:27    Assessment/Plan  1. Leukocytosis, unspecified type WBC 29.1 trend down to 13.7 Afebrile today. - CBC with Differential/Platelet  2. Essential hypertension, benign B/p high today but he did not take his medication prior to visit. - Advised to check Blood pressure at home and record on log provided and notify provider if B/p > 140/90  - CBC with  Differential/Platelet - CMP with eGFR([facility])  3. Type 2 diabetes mellitus with stage 3b chronic kidney disease, without long-term current use of insulin (HCC) Lab Results  Component Value Date   HGBA1C 5.1 08/27/2019  Continue to monitor   4. Chronic obstructive pulmonary disease, unspecified COPD type (HCC) Breathing stable  5. Chronic systolic heart failure (HCC) No signs of fluid overload. - continue on furosemide  - continue to monitor weight at home   6. Paroxysmal atrial fibrillation (HCC) Continue on apixaban  - continue to follow up with Cardiology    Family/ staff Communication: Reviewed plan of care with patient and Friend verbalized understanding  Labs/tests ordered:  - CBC with Differential/Platelet - CMP with eGFR([facility])  Next Appointment: 5 months for medical management of chronic issues.Fasting Labs prior to visit.    DSandrea Hughs NP

## 2020-11-06 NOTE — Progress Notes (Deleted)
  This service is provided via telemedicine  No vital signs collected/recorded due to the encounter was a telemedicine visit.   Location of patient (ex: home, work):  Home.  Patient consents to a telephone visit:  Yes  Location of the provider (ex: office, home):  [employer].  Name of any referring provider:  Wardell Honour, MD   Names of all persons participating in the telemedicine service and their role in the encounter:  Patient, Brent [name], [name], [name], Webb Silversmith, NP.    Time spent on call: 8 minutes spent on the phone with Medical Assistant.

## 2020-11-07 ENCOUNTER — Other Ambulatory Visit (HOSPITAL_COMMUNITY): Payer: Self-pay

## 2020-11-07 ENCOUNTER — Other Ambulatory Visit: Payer: Self-pay

## 2020-11-07 ENCOUNTER — Telehealth: Payer: Self-pay | Admitting: *Deleted

## 2020-11-07 DIAGNOSIS — U071 COVID-19: Secondary | ICD-10-CM

## 2020-11-07 DIAGNOSIS — N1832 Chronic kidney disease, stage 3b: Secondary | ICD-10-CM

## 2020-11-07 DIAGNOSIS — I5042 Chronic combined systolic (congestive) and diastolic (congestive) heart failure: Secondary | ICD-10-CM

## 2020-11-07 DIAGNOSIS — I10 Essential (primary) hypertension: Secondary | ICD-10-CM

## 2020-11-07 NOTE — Patient Outreach (Signed)
[facility] Care Management  11/07/2020  Brent [name] 09-16-1945 [id number]  Follow up: Care Guide, Jeannie referral [facility] is PCP  Received a call from [place] regarding her follow up with the patient who states he is on the list for Meals on Wheels which may take weeks, and Edmonia Lynch states he could likely have [facility] MOM meals but a referral is needed for [facility] RN or LCSW to assist with that particular referral.  Plan:  Will refer to assign patient to [place] for complex care management as well.  Patient recently home from SNF.  For questions,  Natividad Brood, RN BSN [facility] Liaison  [phone] business mobile phone Toll free office [phone]  Fax number: [phone] [email or web address]

## 2020-11-07 NOTE — Telephone Encounter (Signed)
   Telephone encounter was:  Successful.  11/07/2020 Name: MIKIAH DEMOND MRN: [id number] DOB: 1946/01/12  SHIZUO BISKUP is a 75 y.o. year old male who is a primary care patient of [name], Dinah C, NP . The community resource team was consulted for assistance with Transportation Needs  and [place] guide performed the following interventions: Patient provided with information about care guide support team and interviewed to confirm resource needs.  Follow Up Plan:  No further follow up planned at this time. The patient has been provided with needed resources.  [place], Care Management  [phone] [address] , [place] [address] Email : [email or web address]

## 2020-11-07 NOTE — Progress Notes (Signed)
Paramedicine Encounter    Patient ID: Brent [name], male    DOB: 02/02/1946, 75 y.o.   MRN: [id number]   Patient Care Team: [name], [name], NP as PCP - General (Family Medicine) [place], Peter M, MD as PCP - Cardiology (Cardiology) Larey Dresser, MD as PCP - Advanced Heart Failure (Cardiology) Elmarie Shiley, MD (Nephrology) Milus Banister, MD (Gastroenterology) Jorge Ny, LCSW as Social Worker (Licensed Clinical Social Worker)  Patient Active Problem List   Diagnosis Date Noted   SIRS (systemic inflammatory response syndrome) ([place]) 10/31/2020   Neurocognitive deficits 10/17/2020   Hypokalemia 10/08/2020   Hypomagnesemia 10/08/2020   CKD (chronic kidney disease), stage III ([place]) 10/08/2020   COVID-19 virus infection 10/08/2020   Electrolyte abnormality 05/17/2020   ICD (implantable cardioverter-defibrillator) in place 25/36/6440   Chronic systolic heart failure ([place]) 01/02/2020   Decreased appetite 10/08/2019   Weakness 10/08/2019   Hyperkalemia 34/74/2595   Chronic systolic CHF (congestive heart failure) ([place]) 08/26/2019   Type 2 diabetes mellitus with stage 3 chronic kidney disease ([place]) 08/26/2019   Chronic obstructive pulmonary disease ([place]) 06/20/2019   Ventricular tachycardia () 06/20/2019   Acute on chronic systolic (congestive) heart failure ([place]) 63/87/5643   Metabolic acidosis 32/95/1884   Orthostasis 09/07/2018   AKI (acute kidney injury) ([place])    Immunosuppressed status ([place])    Macrocytic anemia    Chronic combined systolic and diastolic congestive heart failure ([place]) 06/03/2018   Candida esophagitis ([place]) 09/22/2017   History of smoking 30 or more pack years 01/05/2017   Bipolar 1 disorder ([place])    BPH (benign prostatic hyperplasia) 03/31/2013   Anemia in chronic kidney disease 03/31/2013   Essential hypertension, benign 03/31/2013   Acute renal failure superimposed on stage 3 chronic kidney disease ([place]) 06/04/2012   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[place])  01/23/2010    Current Outpatient Medications:    acetaminophen (TYLENOL) 160 MG/5ML liquid, Take 10.2 mLs (325 mg total) by mouth every 6 (six) hours as needed for fever or pain., Disp: , Rfl:    allopurinol (ZYLOPRIM) 100 MG tablet, Take 2 tablets (200 mg total) by mouth daily., Disp: 60 tablet, Rfl: 0   apixaban (ELIQUIS) 5 MG TABS tablet, Take 1 tablet (5 mg total) by mouth 2 (two) times daily., Disp: 60 tablet, Rfl: 0   budesonide (ENTOCORT EC) 3 MG 24 hr capsule, Take 3 capsules (9 mg total) by mouth daily., Disp: 30 capsule, Rfl: 0   Calcium 500 MG tablet, Take 1 tablet (500 mg total) by mouth 1 day or 1 dose for 1 dose., Disp: 30 tablet, Rfl:    cephALEXin (KEFLEX) 250 MG capsule, Take 1 capsule (250 mg total) by mouth 2 (two) times daily for 10 days., Disp: 10 capsule, Rfl: 0   Cholecalciferol (VITAMIN D) 125 MCG (5000 UT) CAPS, Take 5,000 Units by mouth daily., Disp: 30 capsule, Rfl: 0   dapagliflozin propanediol (FARXIGA) 10 MG TABS tablet, Take 1 tablet (10 mg total) by mouth daily before breakfast., Disp: 30 tablet, Rfl: 0   divalproex (DEPAKOTE ER) 500 MG 24 hr tablet, Take 3 tablets (1,500 mg total) by mouth at bedtime., Disp: 90 tablet, Rfl: 0   feeding supplement (ENSURE ENLIVE / ENSURE PLUS) LIQD, Take 237 mLs by mouth 2 (two) times daily between meals., Disp: , Rfl:    ferrous sulfate 325 (65 FE) MG tablet, Take 1 tablet (325 mg total) by mouth in the morning and at bedtime., Disp: 30 tablet, Rfl: 0  furosemide (LASIX) 20 MG tablet, Take 1 tablet (20 mg total) by mouth daily., Disp: 30 tablet, Rfl: 0   guaifenesin (ROBITUSSIN) 100 MG/5ML syrup, Take 10 mLs by mouth 3 (three) times daily as needed for cough., Disp: , Rfl:    hydrALAZINE (APRESOLINE) 25 MG tablet, Take 1 tablet (25 mg total) by mouth 3 (three) times daily as needed (High BP, SBP>130)., Disp: 30 tablet, Rfl: 0   inFLIXimab (REMICADE) 100 MG injection, Infuse Remicade IV schedule 1 31m/kg every 8 weeks Premedicate  with Tylenol 500-652mby mouth and Benadryl 25-5079my mouth prior to infusion. Last PPD was on 12/2009. , Disp: 1 each, Rfl: 6   isosorbide mononitrate (IMDUR) 60 MG 24 hr tablet, Take 1 tablet (60 mg total) by mouth daily., Disp: 30 tablet, Rfl: 0   ivabradine (CORLANOR) 5 MG TABS tablet, TAKE 1 TABLET(5 MG) BY MOUTH TWICE DAILY WITH A MEAL, Disp: 60 tablet, Rfl: 0   loperamide (IMODIUM) 2 MG capsule, Take 1 capsule (2 mg total) by mouth 3 (three) times daily as needed for diarrhea or loose stools., Disp: 30 capsule, Rfl: 0   Magnesium 400 MG TABS, Take 400 mg by mouth daily., Disp: 30 tablet, Rfl: 1   metoprolol succinate (TOPROL-XL) 25 MG 24 hr tablet, Take 1 tablet (25 mg total) by mouth in the morning and at bedtime., Disp: 60 tablet, Rfl: 0   mirtazapine (REMERON) 7.5 MG tablet, Take 1 tablet (7.5 mg total) by mouth at bedtime. TAKE 1 TABLET(7.5 MG) BY MOUTH AT BEDTIME, Disp: 30 tablet, Rfl: 0   Multiple Vitamin (MULTIVITAMIN WITH MINERALS) TABS tablet, Take 1 tablet by mouth daily., Disp: , Rfl:    Nutritional Supplements (NUTRITIONAL SUPPLEMENT PO), Take 1 Package by mouth daily. Magic Cup, Disp: , Rfl:    omeprazole (PRILOSEC) 20 MG capsule, Take 1 capsule (20 mg total) by mouth daily., Disp: 30 capsule, Rfl: 0   polyethylene glycol (MIRALAX / GLYCOLAX) 17 g packet, Take 17 g by mouth daily as needed. Give Miralax with 6 oz of water by mouth as needed for constipation, Disp: 14 each, Rfl: 1   QUEtiapine (SEROQUEL) 100 MG tablet, Take 1.5 tablets (150 mg total) by mouth at bedtime., Disp: 45 tablet, Rfl: 0   solifenacin (VESICARE) 5 MG tablet, Take 1 tablet (5 mg total) by mouth daily., Disp: 30 tablet, Rfl: 0   tamsulosin (FLOMAX) 0.4 MG CAPS capsule, Take 1 capsule (0.4 mg total) by mouth daily., Disp: 30 capsule, Rfl: 0   vitamin B-12 (CYANOCOBALAMIN) 500 MCG tablet, Take 1,000 mcg by mouth daily. , Disp: , Rfl:  Allergies  Allergen Reactions   Azathioprine Other (See Comments)     REACTION: affected WBC "Almost died"   Ciprofloxacin Other (See Comments)    Reaction not recalled   Levaquin [Levofloxacin In D5w] Other (See Comments)    Reaction not rec   Plendil [Felodipine] Other (See Comments)    Reaction not not recalled     Social History   Socioeconomic History   Marital status: Divorced    Spouse name: Not on file   Number of children: 1   Years of education: 12   Highest education level: Not on file  Occupational History   Occupation: retired   Occupation: Veteran  Tobacco Use   Smoking status: Former    Packs/day: 1.00    Years: 49.00    Pack years: 49.00    Types: Cigarettes    Start date: 04/11/1956  Quit date: 04/17/2014    Years since quitting: 6.5   Smokeless tobacco: Never  Vaping Use   Vaping Use: Never used  Substance and Sexual Activity   Alcohol use: No    Alcohol/week: 0.0 standard drinks   Drug use: No   Sexual activity: Never  Other Topics Concern   Not on file  Social History Narrative   Not on file   Social Determinants of Health   Financial Resource Strain: Not on file  Food Insecurity: Not on file  Transportation Needs: Not on file  Physical Activity: Not on file  Stress: Not on file  Social Connections: Not on file  Intimate Partner Violence: Not on file    Physical Exam Vitals reviewed.  Constitutional:      Appearance: Normal appearance. He is normal weight.  HENT:     Head: Normocephalic.     Nose: Nose normal.     Mouth/Throat:     Mouth: Mucous membranes are moist.     Pharynx: Oropharynx is clear.  Eyes:     Conjunctiva/sclera: Conjunctivae normal.     Pupils: Pupils are equal, round, and reactive to light.  Cardiovascular:     Rate and Rhythm: Normal rate and regular rhythm.     Pulses: Normal pulses.     Heart sounds: Normal heart sounds.  Pulmonary:     Effort: Pulmonary effort is normal.     Breath sounds: Normal breath sounds.  Abdominal:     General: Abdomen is flat.     Palpations:  Abdomen is soft.  Musculoskeletal:        General: Swelling present. Normal range of motion.     Cervical back: Normal range of motion.     Right lower leg: No edema.     Left lower leg: No edema.     Comments: Lower ankle swelling, both feet.   Skin:    General: Skin is warm and dry.     Capillary Refill: Capillary refill takes less than 2 seconds.  Neurological:     General: No focal deficit present.     Mental Status: He is alert. Mental status is at baseline.  Psychiatric:        Mood and Affect: Mood normal.   Arrived for home visit for Jimmy who was alert and oriented seated on his couch reporting to be feeling "great". Jimmy explained the events leading up to his hospital stay and transfer to SNF and now to home. Laverna Peace is grateful to be home. Laverna Peace has a home aide who comes out daily to assist with cleaning, cooking and personal care services. He also reports he has a nurse names [name] who comes out to help him. Her number is [phone]  I spoke to her and introduced myself and I provided her my information in case she ever needs to reach out.   Today I assessed vitals and assessment. BP elevated and he reports it has been elevated over the last few days. I placed Hydralazine in his morning spot daily for the next week to see if this helps with his BP.   Jimmy had some lower leg swelling but no pitting edema noted. Lungs clear. He reports shortness of breath while walking on flat surfaces and hills.   Jimmy needing an APP visit at the HF clinic within the next two weeks, I will coordinate.   I will see Laverna Peace in one week. Home visit complete.   Refills: NONE     Future  Appointments  Date Time Provider [place]  11/09/2020 11:15 AM [facility] LAB [facility] None  11/12/2020  7:05 AM [facility] DEVICE REMOTES [facility]  12/26/2020 11:20 AM Larey Dresser, MD [facility] None  01/01/2021  7:00 AM [facility] DEVICE REMOTES [facility]   01/29/2021  1:00 PM [name], Dinah C, NP [facility] None  04/02/2021  7:00 AM [facility] DEVICE REMOTES [facility]  04/08/2021  9:00 AM [facility] LAB [facility] None  04/12/2021  2:15 PM [name], Dinah C, NP [facility] None  07/02/2021  7:00 AM [facility] DEVICE REMOTES [facility]     ACTION: Home visit completed

## 2020-11-08 ENCOUNTER — Other Ambulatory Visit: Payer: Self-pay | Admitting: *Deleted

## 2020-11-08 NOTE — Patient Outreach (Addendum)
[facility]) Care Management  11/08/2020  Brent [name] 10/24/1945 [id number]   Referral Received 9/14 Initial Outreach #1 Telephone Screen-Unsuccessful  RN attempted outreach unsuccessful. RN able to leave a HIPAA approved voice message requesting a call back.  Request was made for assistance with food insecurities. Completed [employer] application and sent to care-guide representative to follow up.   Will continue to follow up over the next week for pending services.  Raina Mina, RN Care Management Coordinator [place] Office [phone]

## 2020-11-09 ENCOUNTER — Other Ambulatory Visit: Payer: Self-pay

## 2020-11-09 ENCOUNTER — Telehealth (HOSPITAL_COMMUNITY): Payer: Self-pay | Admitting: Surgery

## 2020-11-09 ENCOUNTER — Other Ambulatory Visit (HOSPITAL_COMMUNITY): Payer: Self-pay | Admitting: Family Medicine

## 2020-11-09 ENCOUNTER — Ambulatory Visit (HOSPITAL_COMMUNITY)
Admission: RE | Admit: 2020-11-09 | Discharge: 2020-11-09 | Disposition: A | Discharge status: Home / Self Care | Payer: Medicare Other | Source: Ambulatory Visit | Attending: Cardiology | Admitting: Cardiology

## 2020-11-09 DIAGNOSIS — I5022 Chronic systolic (congestive) heart failure: Secondary | ICD-10-CM | POA: Insufficient documentation

## 2020-11-09 DIAGNOSIS — I10 Essential (primary) hypertension: Secondary | ICD-10-CM

## 2020-11-09 LAB — COMPLETE METABOLIC PANEL WITH GFR
AG Ratio: 0.9 (calc) — ABNORMAL LOW (ref 1.0–2.5)
ALT: 11 U/L (ref 9–46)
AST: 11 U/L (ref 10–35)
Albumin: 3.2 g/dL — ABNORMAL LOW (ref 3.6–5.1)
Alkaline phosphatase (APISO): 62 U/L (ref 35–144)
BUN/Creatinine Ratio: 14 (calc) (ref 6–22)
BUN: 27 mg/dL — ABNORMAL HIGH (ref 7–25)
CO2: 23 mmol/L (ref 20–32)
Calcium: 7.5 mg/dL — ABNORMAL LOW (ref 8.6–10.3)
Chloride: 113 mmol/L — ABNORMAL HIGH (ref 98–110)
Creat: 1.99 mg/dL — ABNORMAL HIGH (ref 0.70–1.28)
Globulin: 3.5 g/dL (calc) (ref 1.9–3.7)
Glucose, Bld: 86 mg/dL (ref 65–139)
Potassium: 2.9 mmol/L — ABNORMAL LOW (ref 3.5–5.3)
Sodium: 145 mmol/L (ref 135–146)
Total Bilirubin: 0.3 mg/dL (ref 0.2–1.2)
Total Protein: 6.7 g/dL (ref 6.1–8.1)
eGFR: 34 mL/min/{1.73_m2} — ABNORMAL LOW (ref >=60)

## 2020-11-09 LAB — BASIC METABOLIC PANEL
Anion gap: 9 (ref 5–15)
BUN: 17 mg/dL (ref 8–23)
CO2: 22 mmol/L (ref 22–32)
Calcium: 7.7 mg/dL — ABNORMAL LOW (ref 8.9–10.3)
Chloride: 110 mmol/L (ref 98–111)
Creatinine, Ser: 2.14 mg/dL — ABNORMAL HIGH (ref 0.61–1.24)
GFR, Estimated: 31 mL/min — ABNORMAL LOW (ref >60)
Glucose, Bld: 112 mg/dL — ABNORMAL HIGH (ref 70–99)
Potassium: 2.8 mmol/L — ABNORMAL LOW (ref 3.5–5.1)
Sodium: 141 mmol/L (ref 135–145)

## 2020-11-09 LAB — CBC WITH DIFFERENTIAL/PLATELET
Absolute Monocytes: 616 cells/uL (ref 200–950)
Basophils Absolute: 65 cells/uL (ref 0–200)
Basophils Relative: 0.6 %
Eosinophils Absolute: 356 cells/uL (ref 15–500)
Eosinophils Relative: 3.3 %
HCT: 28.4 % — ABNORMAL LOW (ref 38.5–50.0)
Hemoglobin: 9.2 g/dL — ABNORMAL LOW (ref 13.2–17.1)
Lymphs Abs: 2041 cells/uL (ref 850–3900)
MCH: 33.5 pg — ABNORMAL HIGH (ref 27.0–33.0)
MCHC: 32.4 g/dL (ref 32.0–36.0)
MCV: 103.3 fL — ABNORMAL HIGH (ref 80.0–100.0)
MPV: 11.5 fL (ref 7.5–12.5)
Monocytes Relative: 5.7 %
Neutro Abs: 7722 cells/uL (ref 1500–7800)
Neutrophils Relative %: 71.5 %
Platelets: 147 10*3/uL (ref 140–400)
RBC: 2.75 10*6/uL — ABNORMAL LOW (ref 4.20–5.80)
RDW: 13.2 % (ref 11.0–15.0)
Total Lymphocyte: 18.9 %
WBC: 10.8 10*3/uL (ref 3.8–10.8)

## 2020-11-09 LAB — B12 AND FOLATE PANEL
Folate: 5.3 ng/mL — ABNORMAL LOW
Vitamin B-12: 770 pg/mL (ref 200–1100)

## 2020-11-09 LAB — TEST AUTHORIZATION

## 2020-11-09 MED ORDER — SPIRONOLACTONE 25 MG PO TABS: 12.5000 mg | ORAL_TABLET | Freq: Every day | ORAL | 6 refills | Status: DC

## 2020-11-09 MED ORDER — POTASSIUM CHLORIDE CRYS ER 20 MEQ PO TBCR: 40.0000 meq | EXTENDED_RELEASE_TABLET | Freq: Every day | ORAL | 6 refills | Status: DC

## 2020-11-09 NOTE — Progress Notes (Signed)
Spoke with patient regarding his labs; he is agreeable to medication changes. His medications are managed by community paramedicine and [facility]. Spoke with patient's Viola RN, [place]. Patient will need to start KCl 40 m Eq daily and spiro 12.5 mg daily. Rx sent to pharmacy. She will ensure he receives his KCl and spiro today. We will recheck his labs at his follow up appt in clinic 11/14/20.  Allena Katz, FNP-BC

## 2020-11-09 NOTE — Telephone Encounter (Signed)
Micki Riley, RN  11/09/2020  2:37 PM EDT Back to Top    Spoke to patient and delivered the instructions- he is aware and agreeable.  Medication prescription sent to his pharmacy of choice.  He tells me that he can pick up today.  Repeat labwork scheduled for next Friday Sept 23rd.   [place], [place]  11/09/2020  1:19 PM EDT     [facility] [phone] & mailbox full on [phone]. Will reach out to Paramedicine.   [place], FNP  11/09/2020  1:13 PM EDT     K remains low. Is he taking his K supplement?    Start spiro 12.5 mg daily. Take 40 KCl today and tomorrow.   Repeat BMET in 7 days.

## 2020-11-12 ENCOUNTER — Telehealth: Payer: Self-pay

## 2020-11-12 DIAGNOSIS — E876 Hypokalemia: Secondary | ICD-10-CM | POA: Diagnosis not present

## 2020-11-12 DIAGNOSIS — I129 Hypertensive chronic kidney disease with stage 1 through stage 4 chronic kidney disease, or unspecified chronic kidney disease: Secondary | ICD-10-CM | POA: Diagnosis not present

## 2020-11-12 DIAGNOSIS — K5090 Crohn's disease, unspecified, without complications: Secondary | ICD-10-CM | POA: Diagnosis not present

## 2020-11-12 DIAGNOSIS — D631 Anemia in chronic kidney disease: Secondary | ICD-10-CM | POA: Diagnosis not present

## 2020-11-12 DIAGNOSIS — I5022 Chronic systolic (congestive) heart failure: Secondary | ICD-10-CM | POA: Diagnosis not present

## 2020-11-12 DIAGNOSIS — D72829 Elevated white blood cell count, unspecified: Secondary | ICD-10-CM | POA: Diagnosis not present

## 2020-11-12 DIAGNOSIS — N184 Chronic kidney disease, stage 4 (severe): Secondary | ICD-10-CM | POA: Diagnosis not present

## 2020-11-12 NOTE — Telephone Encounter (Signed)
Patient referred to [facility] for one time remote transmission to check fluid levels following last office visit.  Attempted call to patient and no answer.  Pt has OV scheduled with HF clinic on 9/21 and fluid levels will be checked at that time.

## 2020-11-13 NOTE — Progress Notes (Addendum)
PCP: Sandrea Hughs, NP Cardiology: Dr. Leandro Reasoner Brent [name] is a 75 y.o. with a history of Crohn's disease, celiac disease, HTN, COPD, prior tobacco use, CKD 3, chronic anemia, and bipolar disorder. Diagnosed with systolic HF in 4/09 (echo with EF 30-35%).     Admitted 3/11-3/16/20 with SOB and hypoxia. Found to have PNA and required BIPAP and antibiotics. Blood cultures were negative. Echo showed newly reduced EF 30-35%. Cardiology consulted and he had nuclear stress test, which was read as "high risk" due to low EF. Per Dr Sallyanne Kuster, did not think changes were indicative of coronary disease and cath was not pursued due this and due to CKD. HF meds optimized. Limited with CKD3. He had one short run of NSVT. Also had AKI on CKD3, but creatinine improved to baseline 1.77 on day of discharge.   He was seen in ED 05/12/18 with SOB after misplacing his nebulizer. BNP was elevated, so he was given 40 mg IV lasix and discharged with lasix 20 mg BID. Referred to HF clinic.   He was admitted again in 7/20 with weakness, orthostatic hypotension, and AKI.  Cardiac meds were held.  He was noted to be severely deconditioned, and he was discharged to SNF.  He remained in SNF x 2 wks and is now home.   Echo was done in 8/20, EF 20-25% with mild LVH and normal RV.  He had RHC in 8/20 showing normal filling pressures and good cardiac output (surprisingly).   LHC/RHC in 3/21 showed no significant CAD, low filling pressures, preserved cardiac output. Echo in 9/21 with EF 25-30%.  Medtronic ICD placed in 11/21.   He returned 1/22 for followup of CHF.  Doing well, weight is down 5 lbs.  Stable NYHA II symptoms. No diarrhea, Crohns seems stable. Main complaint was knee and shoulder arthritis.   He was admitted 3/22 for hypokalemia after having labs drawn at [place]. K was 2.7 on admit. He was treated with calcium gluconate, IV mag sulfate and potassium.   He was evaluated in the ED 6/22 for chest soreness. Cardiac work  up, including device interrogation, was reassuring.   [facility] follow up 7/22 doing well, NYHA II symptoms, volume stable. BP remained elevated; hydralazine now PRN for SBP >130 per Nephrology.  Admitted 8/15-8/22/22 with hypokalemia and hypocalcemia. He was COVID + 10 days prior and had diarrhea and anorexia. His electrolytes were replaced and he was discharged on previous medications to SNF for PT/OT.  Mildly volume overloaded at hospital follow up 10/26/20. Lasix increased.  Admitted 9/7-9/10/22 with sepsis, WBC 29k, 2/2 UTI. Started on broad-spectrum abx. Mentation initially altered but improved to baseline at discharge. GDMT unchanged at discharge.  Today he returns for post hospital HF follow up here with paramedicine. Still weak from hospitalizations, has not been able to work with [facility] PT yet but plans on doing so next week. Noticed feet are more swollen. No significant exertional dyspnea. Denies CP, abnormal bleeding, dizziness, or PND/Orthopnea. Appetite fair. No fever or chills. Weight at home stable. Taking all medications.   Medtronic device interrogation: OptiVol up but trending back down, thoracic impedance down, 2 hrs/day activity, no VT/VF, no AF (Personally reviewed).  ECG: SR 95 bpm (personally reviewed).  Labs (7/20): K 5.1, creatinine 2.25 Labs (8/20): K 4.8, creatinine 2.65 Labs (9/20): hgb 9.7 Labs (12/20): K 4.1, creatinine 1.76 Labs (3/21): K 5, creatinine 2.25 Labs (6/21): hgb 12 Labs (11/21): K 4.3, creatinine 2.62, hgb 12.2 Labs (6/22): K 3.9,  creatinine 1.93, hgb 11.4 Labs (8/22): K 3.5, creatinine 1.9  PMH: 1. Crohns disease: Gets Remicade.  2. Celiac disease.  3. COPD: Prior smoker.  - CT chest (7/20) with mild emphysema.  - PFTs (9/20): Mild obstruction, mild restriction.  - High resolution CT chest (10/20): No ILD, +chronic bronchitis.  4. CKD: Stage 3.  5. HTN 6. Bipolar disorder 7. H/o BPPV 8. Gout 9. Chronic systolic CHF: Nonischemic cardiomyopathy.   Medtronic ICD.  - Echo (3/20): EF 30-35%, mild LVH, normal RV.  - Cardiolite (3/20): EF 28%, no ischemia, fixed defects in anteroseptal and inferolateral walls.  - Echo (8/20): EF 20-25%, diffuse hypokinesis, mild LVH, normal RV size and systolic function, normal IVC size.  - RHC (8/20): mean RA 3, PA 21/10, mean PCWP 5, CI 3.2 Fick and 3.5 Thermo - RHC/LHC (3/21): No significant CAD; mean RA 1, PA 20/6, mean PCWP 6, CI 4.57  Social History   Socioeconomic History   Marital status: Divorced    Spouse name: Not on file   Number of children: 1   Years of education: 12   Highest education level: Not on file  Occupational History   Occupation: retired   Occupation: Veteran  Tobacco Use   Smoking status: Former    Packs/day: 1.00    Years: 49.00    Pack years: 49.00    Types: Cigarettes    Start date: 04/11/1956    Quit date: 04/17/2014    Years since quitting: 6.5   Smokeless tobacco: Never  Vaping Use   Vaping Use: Never used  Substance and Sexual Activity   Alcohol use: No    Alcohol/week: 0.0 standard drinks   Drug use: No   Sexual activity: Never  Other Topics Concern   Not on file  Social History Narrative   Not on file   Social Determinants of Health   Financial Resource Strain: Not on file  Food Insecurity: Not on file  Transportation Needs: Not on file  Physical Activity: Not on file  Stress: Not on file  Social Connections: Not on file  Intimate Partner Violence: Not on file   Family History  Problem Relation Age of Onset   Diabetes Mother        maternal grandmother   Uterine cancer Mother    Emphysema Father    Pneumonia Maternal Grandmother    Colon cancer Neg Hx    ROS: All systems reviewed and negative except as per HPI.   Current Outpatient Medications  Medication Sig Dispense Refill   acetaminophen (TYLENOL) 160 MG/5ML liquid Take 10.2 mLs (325 mg total) by mouth every 6 (six) hours as needed for fever or pain.     allopurinol (ZYLOPRIM) 100  MG tablet Take 2 tablets (200 mg total) by mouth daily. 60 tablet 0   apixaban (ELIQUIS) 5 MG TABS tablet Take 1 tablet (5 mg total) by mouth 2 (two) times daily. 60 tablet 0   budesonide (ENTOCORT EC) 3 MG 24 hr capsule Take 3 capsules (9 mg total) by mouth daily. 30 capsule 0   Calcium 500 MG tablet Take 1 tablet (500 mg total) by mouth 1 day or 1 dose for 1 dose. 30 tablet    Cholecalciferol (VITAMIN D) 125 MCG (5000 UT) CAPS Take 5,000 Units by mouth daily. 30 capsule 0   dapagliflozin propanediol (FARXIGA) 10 MG TABS tablet Take 1 tablet (10 mg total) by mouth daily before breakfast. 30 tablet 0   divalproex (DEPAKOTE ER) 500 MG  24 hr tablet Take 3 tablets (1,500 mg total) by mouth at bedtime. 90 tablet 0   feeding supplement (ENSURE ENLIVE / ENSURE PLUS) LIQD Take 237 mLs by mouth 2 (two) times daily between meals.     ferrous sulfate 325 (65 FE) MG tablet Take 1 tablet (325 mg total) by mouth in the morning and at bedtime. 30 tablet 0   furosemide (LASIX) 20 MG tablet Take 1 tablet (20 mg total) by mouth daily. 30 tablet 0   guaifenesin (ROBITUSSIN) 100 MG/5ML syrup Take 10 mLs by mouth 3 (three) times daily as needed for cough.     hydrALAZINE (APRESOLINE) 25 MG tablet Take 1 tablet (25 mg total) by mouth 3 (three) times daily as needed (High BP, SBP>130). 30 tablet 0   inFLIXimab (REMICADE) 100 MG injection Infuse Remicade IV schedule 1 69m/kg every 8 weeks Premedicate with Tylenol 500-6579mby mouth and Benadryl 25-5070my mouth prior to infusion. Last PPD was on 12/2009.  1 each 6   isosorbide mononitrate (IMDUR) 60 MG 24 hr tablet Take 1 tablet (60 mg total) by mouth daily. 30 tablet 0   ivabradine (CORLANOR) 5 MG TABS tablet TAKE 1 TABLET(5 MG) BY MOUTH TWICE DAILY WITH A MEAL 60 tablet 0   loperamide (IMODIUM) 2 MG capsule Take 1 capsule (2 mg total) by mouth 3 (three) times daily as needed for diarrhea or loose stools. 30 capsule 0   magnesium oxide (MAG-OX) 400 MG tablet Take 400  mg by mouth 2 (two) times daily.     metoprolol succinate (TOPROL-XL) 25 MG 24 hr tablet Take 1 tablet (25 mg total) by mouth in the morning and at bedtime. 60 tablet 0   mirtazapine (REMERON) 7.5 MG tablet Take 1 tablet (7.5 mg total) by mouth at bedtime. TAKE 1 TABLET(7.5 MG) BY MOUTH AT BEDTIME 30 tablet 0   Multiple Vitamin (MULTIVITAMIN WITH MINERALS) TABS tablet Take 1 tablet by mouth daily.     omeprazole (PRILOSEC) 20 MG capsule Take 1 capsule (20 mg total) by mouth daily. 30 capsule 0   polyethylene glycol (MIRALAX / GLYCOLAX) 17 g packet Take 17 g by mouth daily as needed. Give Miralax with 6 oz of water by mouth as needed for constipation 14 each 1   potassium chloride SA (KLOR-CON) 20 MEQ tablet Take 2 tablets (40 mEq total) by mouth daily. 30 tablet 6   QUEtiapine (SEROQUEL) 100 MG tablet Take 1.5 tablets (150 mg total) by mouth at bedtime. 45 tablet 0   solifenacin (VESICARE) 5 MG tablet Take 1 tablet (5 mg total) by mouth daily. 30 tablet 0   spironolactone (ALDACTONE) 25 MG tablet Take 0.5 tablets (12.5 mg total) by mouth daily. 30 tablet 6   tamsulosin (FLOMAX) 0.4 MG CAPS capsule Take 1 capsule (0.4 mg total) by mouth daily. 30 capsule 0   vitamin B-12 (CYANOCOBALAMIN) 500 MCG tablet Take 1,000 mcg by mouth daily.      No current facility-administered medications for this encounter.   BP 130/70   Pulse (!) 102   Wt 76.6 kg (168 lb 12.8 oz)   SpO2 95%   BMI 20.61 kg/m   Wt Readings from Last 3 Encounters:  11/14/20 76.6 kg (168 lb 12.8 oz)  11/07/20 75.8 kg (167 lb)  11/06/20 78.3 kg (172 lb 9.6 oz)   Physical Exam: General:  NAD. No resp difficulty, elderly, weak-appearing HEENT: Normal Neck: Supple. No JVD. Carotids 2+ bilat; no bruits. No lymphadenopathy or thryomegaly appreciated.  Cor: PMI nondisplaced. Regular rate & rhythm. No rubs, gallops or murmurs. Lungs: Clear, diminished in bilateral bases Abdomen: Soft, nontender, nondistended. No hepatosplenomegaly. No  bruits or masses. Good bowel sounds. Extremities: No cyanosis, clubbing, rash, 1+ BLE edema R>L Neuro: Alert & oriented x 3, cranial nerves grossly intact. Moves all 4 extremities w/o difficulty. Affect pleasant.  Assessment/Plan: 1. Chronic systolic CHF: Nonischemic cardiomyoapthy.  Diagnosed by echo in 3/20, EF 30-35%.  LHC in 3/21 with no significant CAD. He has no chest pain.  Medication titration has been limited by orthostasis/low BP.  Echo in 8/20 showed EF 20-25% with mild LVH and normal RV function.  With resting sinus tachycardia, I was concerned for low output HF, but RHC in 8/20 and again in 3/21 showed normal cardiac output and filling pressures. Echo in 9/21 with EF 25-30%, Medtronic ICD placed.  GDMT has been limited by low BP and orthostasis in the past, and by CKD. Today, NYHA class II, although he is not very physically active and he is limited mainly by physical deconditioning/weakness. He is mildly volume overloaded on exam and by OptiVol.  - Increase Lasix to 40 mg daily + extra 20 KCl. x 3 days then back to lasix 20 mg daily. BMET & magnesium today, repeat BMET in 10 days. - Will ask device RN to send transmission in 1 week to ensure fluid levels return to baseline. - Place compression hose. - Continue Farxiga 10 mg daily. No GU symptoms. Follow closely with recent sepsis 2/2 UTI. - Continue Imdur 60 mg daily. - Continue Toprol XL 25 mg bid.  - Continue ivabradine 5 mg bid.   - Continue spiro 12.5 mg daily. Recently added due to profound hypokalemia. Follow renal function closely. - No ACEI/ARB/ARNI with elevated creatinine.  - He would be a difficult candidate for advanced therapies with deconditioning, renal dysfunction, and lives alone.  2. Atrial fibrillation, paroxysmal: Occurred in 2/22. NSR today on ECG. On Eliquis. No bleeding. - Followed by Dr. Lovena Le. - Check CBC today. 3. CKD: Stage 3. Followed by Dr. Posey Pronto. - BMET today. 4. COPD: Mild emphysema on prior chest  CT.  PFTs in 9/20 showed mild obstruction and mild restriction.  High resolution CT chest 10/20 showed no ILD, +chronic bronchitis.  - Repeat CT chests to follow small lung nodule given smoking history.  5. HTN: Elevated today. He has struggled with orthostasis and low BP in the past. Hydralazine is now PRN for SBP>130 per nephrology, however he has difficulty remembering to check BP at home. BP with paramedicine consistently >130s.  - Start hydralazine 12.5 mg tid.   Follow up in 2 months with Dr. [name] as scheduled. Continue with paramedicine, appreciate their assistance.  Allena Katz, FNP-BC 11/14/2020

## 2020-11-14 ENCOUNTER — Other Ambulatory Visit (HOSPITAL_COMMUNITY): Payer: Self-pay

## 2020-11-14 ENCOUNTER — Encounter (HOSPITAL_COMMUNITY): Payer: Self-pay

## 2020-11-14 ENCOUNTER — Other Ambulatory Visit: Payer: Self-pay

## 2020-11-14 ENCOUNTER — Ambulatory Visit (HOSPITAL_COMMUNITY)
Admission: RE | Admit: 2020-11-14 | Discharge: 2020-11-14 | Disposition: A | Discharge status: Home / Self Care | Payer: Medicare Other | Source: Ambulatory Visit | Attending: Family Medicine | Admitting: Family Medicine

## 2020-11-14 ENCOUNTER — Telehealth (HOSPITAL_COMMUNITY): Payer: Self-pay

## 2020-11-14 ENCOUNTER — Telehealth: Payer: Self-pay | Admitting: *Deleted

## 2020-11-14 ENCOUNTER — Other Ambulatory Visit: Payer: Self-pay | Admitting: *Deleted

## 2020-11-14 VITALS — BP 130/70 | HR 102 | Wt 168.8 lb

## 2020-11-14 DIAGNOSIS — Z7901 Long term (current) use of anticoagulants: Secondary | ICD-10-CM | POA: Insufficient documentation

## 2020-11-14 DIAGNOSIS — F319 Bipolar disorder, unspecified: Secondary | ICD-10-CM | POA: Diagnosis not present

## 2020-11-14 DIAGNOSIS — Z79899 Other long term (current) drug therapy: Secondary | ICD-10-CM | POA: Insufficient documentation

## 2020-11-14 DIAGNOSIS — I10 Essential (primary) hypertension: Secondary | ICD-10-CM | POA: Diagnosis not present

## 2020-11-14 DIAGNOSIS — K900 Celiac disease: Secondary | ICD-10-CM | POA: Diagnosis not present

## 2020-11-14 DIAGNOSIS — I428 Other cardiomyopathies: Secondary | ICD-10-CM | POA: Insufficient documentation

## 2020-11-14 DIAGNOSIS — Z87440 Personal history of urinary (tract) infections: Secondary | ICD-10-CM | POA: Insufficient documentation

## 2020-11-14 DIAGNOSIS — I480 Paroxysmal atrial fibrillation: Secondary | ICD-10-CM

## 2020-11-14 DIAGNOSIS — Z7984 Long term (current) use of oral hypoglycemic drugs: Secondary | ICD-10-CM | POA: Diagnosis not present

## 2020-11-14 DIAGNOSIS — I5022 Chronic systolic (congestive) heart failure: Secondary | ICD-10-CM | POA: Diagnosis not present

## 2020-11-14 DIAGNOSIS — K5090 Crohn's disease, unspecified, without complications: Secondary | ICD-10-CM | POA: Diagnosis not present

## 2020-11-14 DIAGNOSIS — J449 Chronic obstructive pulmonary disease, unspecified: Secondary | ICD-10-CM | POA: Insufficient documentation

## 2020-11-14 DIAGNOSIS — Z87891 Personal history of nicotine dependence: Secondary | ICD-10-CM | POA: Diagnosis not present

## 2020-11-14 DIAGNOSIS — Z8616 Personal history of COVID-19: Secondary | ICD-10-CM | POA: Insufficient documentation

## 2020-11-14 DIAGNOSIS — Z8619 Personal history of other infectious and parasitic diseases: Secondary | ICD-10-CM | POA: Insufficient documentation

## 2020-11-14 DIAGNOSIS — Z95810 Presence of automatic (implantable) cardiac defibrillator: Secondary | ICD-10-CM | POA: Insufficient documentation

## 2020-11-14 DIAGNOSIS — Z7952 Long term (current) use of systemic steroids: Secondary | ICD-10-CM | POA: Insufficient documentation

## 2020-11-14 DIAGNOSIS — I130 Hypertensive heart and chronic kidney disease with heart failure and stage 1 through stage 4 chronic kidney disease, or unspecified chronic kidney disease: Secondary | ICD-10-CM | POA: Diagnosis not present

## 2020-11-14 DIAGNOSIS — N183 Chronic kidney disease, stage 3 unspecified: Secondary | ICD-10-CM | POA: Diagnosis not present

## 2020-11-14 LAB — BASIC METABOLIC PANEL
Anion gap: 8 (ref 5–15)
BUN: 22 mg/dL (ref 8–23)
CO2: 26 mmol/L (ref 22–32)
Calcium: 8.4 mg/dL — ABNORMAL LOW (ref 8.9–10.3)
Chloride: 108 mmol/L (ref 98–111)
Creatinine, Ser: 2.05 mg/dL — ABNORMAL HIGH (ref 0.61–1.24)
GFR, Estimated: 33 mL/min — ABNORMAL LOW (ref >60)
Glucose, Bld: 87 mg/dL (ref 70–99)
Potassium: 3.5 mmol/L (ref 3.5–5.1)
Sodium: 142 mmol/L (ref 135–145)

## 2020-11-14 LAB — MAGNESIUM: Magnesium: 1.1 mg/dL — ABNORMAL LOW (ref 1.7–2.4)

## 2020-11-14 LAB — CBC
HCT: 30 % — ABNORMAL LOW (ref 39.0–52.0)
Hemoglobin: 9.4 g/dL — ABNORMAL LOW (ref 13.0–17.0)
MCH: 33.7 pg (ref 26.0–34.0)
MCHC: 31.3 g/dL (ref 30.0–36.0)
MCV: 107.5 fL — ABNORMAL HIGH (ref 80.0–100.0)
Platelets: 164 10*3/uL (ref 150–400)
RBC: 2.79 MIL/uL — ABNORMAL LOW (ref 4.22–5.81)
RDW: 14.3 % (ref 11.5–15.5)
WBC: 10.1 10*3/uL (ref 4.0–10.5)
nRBC: 0 % (ref 0.0–0.2)

## 2020-11-14 MED ORDER — POTASSIUM CHLORIDE CRYS ER 20 MEQ PO TBCR: 40.0000 meq | EXTENDED_RELEASE_TABLET | Freq: Every day | ORAL | 3 refills | Status: DC

## 2020-11-14 MED ORDER — HYDRALAZINE HCL 25 MG PO TABS: 12.5000 mg | ORAL_TABLET | Freq: Three times a day (TID) | ORAL | 3 refills | Status: DC

## 2020-11-14 NOTE — Telephone Encounter (Signed)
[facility] PA Tobie Poet reached out to me reporting she increased [name] Magnesium dose to 411m BID as of Monday 9/19. I made HF clinic aware of same. We will follow up in clinic today.

## 2020-11-14 NOTE — Telephone Encounter (Signed)
   Telephone encounter was:  Successful.  11/14/2020 Name: Brent [name] MRN: [id number] DOB: 1945-04-13  Brent [name] is a 75 y.o. year old male who is a primary care patient of [name], Dinah C, NP . The community resource team was consulted for assistance with Food InsecurityCalled patient back due to length of time getting MOMs meals patient was very understanding , Says mobile number is best to reach him at in chart , Completed the m,issing information on Moms meals and resubmitted it   Care guide performed the following interventions: Patient provided with information about care guide support team and interviewed to confirm resource needs Follow up call placed to community resources to determine status of patients referral.  Follow Up Plan:  No further follow up planned at this time. The patient has been provided with needed resources. [place], Care Management  [phone] [address] , [place] [address] Email : [email or web address]

## 2020-11-14 NOTE — Progress Notes (Signed)
Paramedicine Encounter    Patient ID: MARINO ROGERSON, male    DOB: 11-05-1945, 75 y.o.   MRN: [id number]   Met with [name] in clinic today where he reports some shortness of breath and some right leg swelling. Allena Katz NP saw Franky today reporting his fluid levels were trending up last week but are trending down. Med changes as follows:  -Lasix 45m daily X3 days  -Extra Potassium with same  -Hydralazine 12.590mTID   -Add compression stockings   Labs today, repeat in one week. I will see Mr. HaElnoria Howardn the home next week.   Refills: Budesonide ([facility])  ACTION: Home visit completed

## 2020-11-14 NOTE — Patient Instructions (Addendum)
Increase Lasix to 40 mg daily for 3 days  Increase Potassium by 20 MEQ for 3 days Then back to normal dose of lasix 20 mg daily and your normal dose of your potassium of 40 MEQ daily.  Labs today We will only contact you if something comes back abnormal or we need to make some changes. Otherwise no news is good news!  Please wear your compression stocking everyday.  Your physician recommends that you schedule a follow-up appointment in: 2 months with Dr. [name] and do a repeat of a BMET in the lab which has been scheduled(see appointment card).  At the [facility], you and your health needs are our priority. As part of our continuing mission to provide you with exceptional heart care, we have created designated Provider Care Teams. These Care Teams include your primary Cardiologist (physician) and Advanced Practice Providers (APPs- Physician Assistants and Nurse Practitioners) who all work together to provide you with the care you need, when you need it.   You may see any of the following providers on your designated Care Team at your next follow up: Dr Glori Bickers Dr Loralie Champagne Dr Patrice Paradise, NP Lyda Jester, [place] Ginnie Smart Audry Riles, PharmD   Please be sure to bring in all your medications bottles to every appointment  Do the following things EVERYDAY: Weigh yourself in the morning before breakfast. Write it down and keep it in a log. Take your medicines as prescribed Eat low salt foods--Limit salt (sodium) to 2000 mg per day.  Stay as active as you can everyday Limit all fluids for the day to less than 2 liters

## 2020-11-14 NOTE — Patient Outreach (Addendum)
[place] [facility]) Care Management  11/14/2020  Brent [name] [birth year] [id number]   Telephone Assessment-Successful-Declined THN  RN spoke with care-guide early today who resubmitted pt's Mom[employer] Meals for [facility]. All information provider from e-mail received a yesterday for qualifying this pt for pending services.  RN spoke with pt with update on the delay for food services via [employer]. Pt very appreciative. RN further explained [facility] services and discussed some of pt's medical conditions. Pt reports he has several agencies involved with his care.   [facility] ---PT 2.  Para-medicine (Dr. [name]) --[facility]. Fills pt's pill box due to the amount of medications. Services also monitors pt's for his defibrillator.   Pt reports HTN however better controlled at this time but recently elevated as pt aware that it was his dietary habits and will request a dietitian on his next provider's visit based upon his inconsistency with his diet. Pt aware of what not to consume and indicates he will do better with his eating habits (heart healthy).  RN offered to assist with diet on heart healthy but pt has opt to decline due to pending dietitian consult.   Education on HF as pt has indicated he is in the GREEN zone today but has had swelling in the past and aware of the medication he is taking for this condition. Pt is also involved with the HF clinic with visits from para-medicine.  Verified pt has sufficient transportation services to all medical appointments and declined any additional resources at this time.  Pt admantely declined [facility] care management services indicating he has enough agencies involved with his care. Pt is aware on how to contact [facility] and care-guides if additional resources are needed. RN also offered a Health Coach for his ongoing HTN/HF and pt indicates he does not have diabetes and refused to discussed this topic/medical condition. Based upon pt's declined  for [facility] and RN case management services will close this care and notify pt's provider of his disposition with [facility] services.  Raina Mina, RN Care Management Coordinator [facility] [phone]

## 2020-11-14 NOTE — Progress Notes (Unsigned)
Paramedicine Encounter    Patient ID: Brent [name], male    DOB: [birth year], 75 y.o.   MRN: [id number]   Patient Care Team: [name], [name], NP as PCP - General (Family Medicine) Martinique, Peter M, MD as PCP - Cardiology (Cardiology) Larey Dresser, MD as PCP - Advanced Heart Failure (Cardiology) Elmarie Shiley, MD (Nephrology) Milus Banister, MD (Gastroenterology) Jorge Ny, LCSW as Social Worker (Licensed Clinical Social Worker) Tobi Bastos, RN as [facility]  Patient Active Problem List   Diagnosis Date Noted   SIRS (systemic inflammatory response syndrome) ([place]) 10/31/2020   Neurocognitive deficits 10/17/2020   Hypokalemia 10/08/2020   Hypomagnesemia 10/08/2020   CKD (chronic kidney disease), stage III ([place]) 10/08/2020   COVID-19 virus infection 10/08/2020   Electrolyte abnormality 05/17/2020   ICD (implantable cardioverter-defibrillator) in place 53/66/4403   Chronic systolic heart failure ([place]) 01/02/2020   Decreased appetite 10/08/2019   Weakness 10/08/2019   Hyperkalemia 47/42/5956   Chronic systolic CHF (congestive heart failure) ([place]) 08/26/2019   Type 2 diabetes mellitus with stage 3 chronic kidney disease ([place]) 08/26/2019   Chronic obstructive pulmonary disease ([place]) 06/20/2019   Ventricular tachycardia ([place]) 06/20/2019   Acute on chronic systolic (congestive) heart failure ([place]) 38/75/6433   Metabolic acidosis 29/51/8841   Orthostasis 09/07/2018   AKI (acute kidney injury) ([place])    Immunosuppressed status ([place])    Macrocytic anemia    Chronic combined systolic and diastolic congestive heart failure ([place]) 06/03/2018   Candida esophagitis ([place]) 09/22/2017   History of smoking 30 or more pack years 01/05/2017   Bipolar 1 disorder ([place])    BPH (benign prostatic hyperplasia) 03/31/2013   Anemia in chronic kidney disease 03/31/2013   Essential hypertension, benign 03/31/2013   Acute renal failure superimposed on stage 3 chronic  kidney disease ([place]) 06/04/2012   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[place]) 01/23/2010    Current Outpatient Medications:    acetaminophen (TYLENOL) 160 MG/5ML liquid, Take 10.2 mLs (325 mg total) by mouth every 6 (six) hours as needed for fever or pain., Disp: , Rfl:    allopurinol (ZYLOPRIM) 100 MG tablet, Take 2 tablets (200 mg total) by mouth daily., Disp: 60 tablet, Rfl: 0   apixaban (ELIQUIS) 5 MG TABS tablet, Take 1 tablet (5 mg total) by mouth 2 (two) times daily., Disp: 60 tablet, Rfl: 0   budesonide (ENTOCORT EC) 3 MG 24 hr capsule, Take 3 capsules (9 mg total) by mouth daily., Disp: 30 capsule, Rfl: 0   Calcium 500 MG tablet, Take 1 tablet (500 mg total) by mouth 1 day or 1 dose for 1 dose., Disp: 30 tablet, Rfl:    Cholecalciferol (VITAMIN D) 125 MCG (5000 UT) CAPS, Take 5,000 Units by mouth daily., Disp: 30 capsule, Rfl: 0   dapagliflozin propanediol (FARXIGA) 10 MG TABS tablet, Take 1 tablet (10 mg total) by mouth daily before breakfast., Disp: 30 tablet, Rfl: 0   divalproex (DEPAKOTE ER) 500 MG 24 hr tablet, Take 3 tablets (1,500 mg total) by mouth at bedtime., Disp: 90 tablet, Rfl: 0   feeding supplement (ENSURE ENLIVE / ENSURE PLUS) LIQD, Take 237 mLs by mouth 2 (two) times daily between meals., Disp: , Rfl:    ferrous sulfate 325 (65 FE) MG tablet, Take 1 tablet (325 mg total) by mouth in the morning and at bedtime., Disp: 30 tablet, Rfl: 0   furosemide (LASIX) 20 MG tablet, Take 1 tablet (20 mg total) by mouth daily., Disp: 30  tablet, Rfl: 0   guaifenesin (ROBITUSSIN) 100 MG/5ML syrup, Take 10 mLs by mouth 3 (three) times daily as needed for cough., Disp: , Rfl:    hydrALAZINE (APRESOLINE) 25 MG tablet, Take 0.5 tablets (12.5 mg total) by mouth 3 (three) times daily., Disp: 45 tablet, Rfl: 3   inFLIXimab (REMICADE) 100 MG injection, Infuse Remicade IV schedule 1 17m/kg every 8 weeks Premedicate with Tylenol 500-6548mby mouth and Benadryl 25-5056my mouth prior to infusion. Last PPD was  on 12/2009. , Disp: 1 each, Rfl: 6   isosorbide mononitrate (IMDUR) 60 MG 24 hr tablet, Take 1 tablet (60 mg total) by mouth daily., Disp: 30 tablet, Rfl: 0   ivabradine (CORLANOR) 5 MG TABS tablet, TAKE 1 TABLET(5 MG) BY MOUTH TWICE DAILY WITH A MEAL, Disp: 60 tablet, Rfl: 0   loperamide (IMODIUM) 2 MG capsule, Take 1 capsule (2 mg total) by mouth 3 (three) times daily as needed for diarrhea or loose stools., Disp: 30 capsule, Rfl: 0   magnesium oxide (MAG-OX) 400 MG tablet, Take 400 mg by mouth 2 (two) times daily., Disp: , Rfl:    metoprolol succinate (TOPROL-XL) 25 MG 24 hr tablet, Take 1 tablet (25 mg total) by mouth in the morning and at bedtime., Disp: 60 tablet, Rfl: 0   mirtazapine (REMERON) 7.5 MG tablet, Take 1 tablet (7.5 mg total) by mouth at bedtime. TAKE 1 TABLET(7.5 MG) BY MOUTH AT BEDTIME, Disp: 30 tablet, Rfl: 0   Multiple Vitamin (MULTIVITAMIN WITH MINERALS) TABS tablet, Take 1 tablet by mouth daily., Disp: , Rfl:    omeprazole (PRILOSEC) 20 MG capsule, Take 1 capsule (20 mg total) by mouth daily., Disp: 30 capsule, Rfl: 0   polyethylene glycol (MIRALAX / GLYCOLAX) 17 g packet, Take 17 g by mouth daily as needed. Give Miralax with 6 oz of water by mouth as needed for constipation, Disp: 14 each, Rfl: 1   potassium chloride SA (KLOR-CON) 20 MEQ tablet, Take 2 tablets (40 mEq total) by mouth daily., Disp: 60 tablet, Rfl: 3   QUEtiapine (SEROQUEL) 100 MG tablet, Take 1.5 tablets (150 mg total) by mouth at bedtime., Disp: 45 tablet, Rfl: 0   solifenacin (VESICARE) 5 MG tablet, Take 1 tablet (5 mg total) by mouth daily., Disp: 30 tablet, Rfl: 0   spironolactone (ALDACTONE) 25 MG tablet, Take 0.5 tablets (12.5 mg total) by mouth daily., Disp: 30 tablet, Rfl: 6   tamsulosin (FLOMAX) 0.4 MG CAPS capsule, Take 1 capsule (0.4 mg total) by mouth daily., Disp: 30 capsule, Rfl: 0   vitamin B-12 (CYANOCOBALAMIN) 500 MCG tablet, Take 1,000 mcg by mouth daily. , Disp: , Rfl:  Allergies  Allergen  Reactions   Azathioprine Other (See Comments)    REACTION: affected WBC "Almost died"   Ciprofloxacin Other (See Comments)    Reaction not recalled   Levaquin [Levofloxacin In D5w] Other (See Comments)    Reaction not rec   Plendil [Felodipine] Other (See Comments)    Reaction not not recalled     Social History   Socioeconomic History   Marital status: Divorced    Spouse name: Not on file   Number of children: 1   Years of education: 12   Highest education level: Not on file  Occupational History   Occupation: retired   Occupation: [employer] te[employer] Use   Smoking status: Former    Packs/day: 1.00    Years: 49.00    Pack years: 49.00    Types: Cigarettes  Start date: 04/11/1956    Quit date: 04/17/2014    Years since quitting: 6.5   Smokeless tobacco: Never  Vaping Use   Vaping Use: Never used  Substance and Sexual Activity   Alcohol use: No    Alcohol/week: 0.0 standard drinks   Drug use: No   Sexual activity: Never  Other Topics Concern   Not on file  Social History Narrative   Not on file   Social Determinants of Health   Financial Resource Strain: Not on file  Food Insecurity: Not on file  Transportation Needs: Not on file  Physical Activity: Not on file  Stress: Not on file  Social Connections: Not on file  Intimate Partner Violence: Not on file    Physical Exam      Future Appointments  Date Time Provider [place]  11/15/2020  8:45 AM [facility] LAB [facility] None  11/21/2020  3:45 PM [facility] LAB [facility] None  12/26/2020 11:20 AM Larey Dresser, MD [facility] None  01/01/2021  7:00 AM [facility] DEVICE REMOTES [facility]  01/29/2021  1:00 PM [name], Dinah C, NP [facility] None  04/02/2021  7:00 AM [facility] DEVICE REMOTES [facility]  04/08/2021  9:00 AM [facility] LAB [facility] None  04/12/2021  2:15 PM [name], Dinah C, NP [facility] None  07/02/2021  7:00 AM [facility] DEVICE REMOTES [facility]      ACTION: {Paramed Action:[phone]}

## 2020-11-14 NOTE — Addendum Note (Signed)
Encounter addended by: Rafael Bihari, FNP on: 11/14/2020 8:23 PM  Actions taken: Clinical Note Signed

## 2020-11-15 ENCOUNTER — Other Ambulatory Visit: Payer: Medicare Other

## 2020-11-15 DIAGNOSIS — I10 Essential (primary) hypertension: Secondary | ICD-10-CM

## 2020-11-16 ENCOUNTER — Telehealth (HOSPITAL_COMMUNITY): Payer: Self-pay

## 2020-11-16 ENCOUNTER — Other Ambulatory Visit (HOSPITAL_COMMUNITY): Payer: Medicare Other

## 2020-11-16 NOTE — Telephone Encounter (Signed)
Called in refills for [name] to the [facility]: -Budesonide

## 2020-11-17 ENCOUNTER — Encounter (HOSPITAL_COMMUNITY): Payer: Self-pay | Admitting: Emergency Medicine

## 2020-11-17 ENCOUNTER — Other Ambulatory Visit: Payer: Self-pay

## 2020-11-17 ENCOUNTER — Emergency Department (HOSPITAL_COMMUNITY)
Admission: EM | Admit: 2020-11-17 | Discharge: 2020-11-17 | Disposition: A | Discharge status: Home / Self Care | Payer: No Typology Code available for payment source | Attending: Emergency Medicine | Admitting: Emergency Medicine

## 2020-11-17 DIAGNOSIS — Z79899 Other long term (current) drug therapy: Secondary | ICD-10-CM | POA: Diagnosis not present

## 2020-11-17 DIAGNOSIS — N183 Chronic kidney disease, stage 3 unspecified: Secondary | ICD-10-CM | POA: Diagnosis not present

## 2020-11-17 DIAGNOSIS — Z8616 Personal history of COVID-19: Secondary | ICD-10-CM | POA: Diagnosis not present

## 2020-11-17 DIAGNOSIS — I5042 Chronic combined systolic (congestive) and diastolic (congestive) heart failure: Secondary | ICD-10-CM | POA: Insufficient documentation

## 2020-11-17 DIAGNOSIS — D631 Anemia in chronic kidney disease: Secondary | ICD-10-CM | POA: Insufficient documentation

## 2020-11-17 DIAGNOSIS — Z7901 Long term (current) use of anticoagulants: Secondary | ICD-10-CM | POA: Diagnosis not present

## 2020-11-17 DIAGNOSIS — Z87891 Personal history of nicotine dependence: Secondary | ICD-10-CM | POA: Insufficient documentation

## 2020-11-17 DIAGNOSIS — I130 Hypertensive heart and chronic kidney disease with heart failure and stage 1 through stage 4 chronic kidney disease, or unspecified chronic kidney disease: Secondary | ICD-10-CM | POA: Diagnosis not present

## 2020-11-17 DIAGNOSIS — K0889 Other specified disorders of teeth and supporting structures: Secondary | ICD-10-CM | POA: Insufficient documentation

## 2020-11-17 NOTE — Discharge Instructions (Signed)
Return for any problem.  You should be able to obtain dental wax or orthodontic wax from any drugstore.  This can be used to cover the sharp surface of the broken implant in your mouth.  This should help alleviate your discomfort until your implant can be replaced.

## 2020-11-17 NOTE — ED Provider Notes (Signed)
[facility] EMERGENCY DEPARTMENT Provider Note   CSN: [id number] Arrival date & time: 11/17/20  0121     History Chief Complaint  Patient presents with   Dental Pain    Brent [name] is a 75 y.o. male.  75 year old male with prior medical history as detailed below presents for evaluation.  Patient with implants.  Patient's lower implant broke.  There is a rough surface that is irritating his tongue.  The implant broke yesterday.  He reports that his dentist with the [facility] is already working on getting a replacement for him.  The history is provided by the patient.  Dental Pain Location:  Lower Lower teeth location: Broken section of implant is irritating his tongue. Quality:  Sharp Severity:  Mild Onset quality:  Sudden Duration:  1 day Timing:  Constant     Past Medical History:  Diagnosis Date   Anemia    Anxiety    Benign paroxysmal positional vertigo    Bipolar I disorder, most recent episode (or current) unspecified    Celiac disease    CHF (congestive heart failure) (HCC)    Chronic kidney disease, stage III (moderate) (HCC)    Crohn's    Remicade q8 weeks   Depression    Essential and other specified forms of tremor    medication-induced Parkinson's, now resolved   Essential hypertension, benign    Gout 2018   Heart failure (HCC)    Hypertrophy of prostate without urinary obstruction and other lower urinary tract symptoms (LUTS)    Impotence of organic origin    Insomnia with sleep apnea, unspecified    Iron deficiency anemia, unspecified    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   Neuralgia, neuritis, and radiculitis, unspecified    Other B-complex deficiencies    Other extrapyramidal disease and abnormal movement disorder    Postinflammatory pulmonary fibrosis ([place])    Tobacco use disorder    Vertigo 2018    Patient Active Problem List   Diagnosis Date Noted   SIRS (systemic inflammatory response syndrome) ([place]) 10/31/2020   Neurocognitive  deficits 10/17/2020   Hypokalemia 10/08/2020   Hypomagnesemia 10/08/2020   CKD (chronic kidney disease), stage III ([place]) 10/08/2020   COVID-19 virus infection 10/08/2020   Electrolyte abnormality 05/17/2020   ICD (implantable cardioverter-defibrillator) in place 44/62/8638   Chronic systolic heart failure ([place]) 01/02/2020   Decreased appetite 10/08/2019   Weakness 10/08/2019   Hyperkalemia 17/71/1657   Chronic systolic CHF (congestive heart failure) ([place]) 08/26/2019   Type 2 diabetes mellitus with stage 3 chronic kidney disease ([place]) 08/26/2019   Chronic obstructive pulmonary disease ([place]) 06/20/2019   Ventricular tachycardia ([place]) 06/20/2019   Acute on chronic systolic (congestive) heart failure ([place]) 90/38/3338   Metabolic acidosis 32/91/9166   Orthostasis 09/07/2018   AKI (acute kidney injury) ([place])    Immunosuppressed status ([place])    Macrocytic anemia    Chronic combined systolic and diastolic congestive heart failure ([place]) 06/03/2018   Candida esophagitis ([place]) 09/22/2017   History of smoking 30 or more pack years 01/05/2017   Bipolar 1 disorder ([place])    BPH (benign prostatic hyperplasia) 03/31/2013   Anemia in chronic kidney disease 03/31/2013   Essential hypertension, benign 03/31/2013   Acute renal failure superimposed on stage 3 chronic kidney disease ([place]) 06/04/2012   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[place]) 01/23/2010    Past Surgical History:  Procedure Laterality Date   CATARACT EXTRACTION, BILATERAL Bilateral 09/2018   CHOLECYSTECTOMY  07-12-2010   ICD IMPLANT N/A 01/02/2020  Procedure: ICD IMPLANT;  Surgeon: Evans Lance, MD;  Location: [facility];  Service: Cardiovascular;  Laterality: N/A;   RIGHT HEART CATH N/A 10/12/2018   Procedure: RIGHT HEART CATH;  Surgeon: Jolaine Artist, MD;  Location: [facility];  Service: Cardiovascular;  Laterality: N/A;   RIGHT/LEFT HEART CATH AND CORONARY ANGIOGRAPHY N/A 04/26/2019   Procedure: RIGHT/LEFT HEART CATH  AND CORONARY ANGIOGRAPHY;  Surgeon: Larey Dresser, MD;  Location: [facility];  Service: Cardiovascular;  Laterality: N/A;   SMALL INTESTINE SURGERY     x 2       Family History  Problem Relation Age of Onset   Diabetes Mother        maternal grandmother   Uterine cancer Mother    Emphysema Father    Pneumonia Maternal Grandmother    Colon cancer Neg Hx     Social History   Tobacco Use   Smoking status: Former    Packs/day: 1.00    Years: 49.00    Pack years: 49.00    Types: Cigarettes    Start date: 04/11/1956    Quit date: 04/17/2014    Years since quitting: 6.5   Smokeless tobacco: Never  Vaping Use   Vaping Use: Never used  Substance Use Topics   Alcohol use: No    Alcohol/week: 0.0 standard drinks   Drug use: No    Home Medications Prior to Admission medications   Medication Sig Start Date End Date Taking? Authorizing Provider  acetaminophen (TYLENOL) 160 MG/5ML liquid Take 10.2 mLs (325 mg total) by mouth every 6 (six) hours as needed for fever or pain. 10/15/20   Hongalgi, Lenis Dickinson, MD  allopurinol (ZYLOPRIM) 100 MG tablet Take 2 tablets (200 mg total) by mouth daily. 10/28/20   Medina-Vargas, Monina C, NP  apixaban (ELIQUIS) 5 MG TABS tablet Take 1 tablet (5 mg total) by mouth 2 (two) times daily. 10/28/20 11/27/20  Medina-Vargas, Monina C, NP  budesonide (ENTOCORT EC) 3 MG 24 hr capsule Take 3 capsules (9 mg total) by mouth daily. 10/28/20   Medina-Vargas, Monina C, NP  Calcium 500 MG tablet Take 1 tablet (500 mg total) by mouth 1 day or 1 dose for 1 dose. 05/19/20 12/01/20  Geradine Girt, DO  Cholecalciferol (VITAMIN D) 125 MCG (5000 UT) CAPS Take 5,000 Units by mouth daily. 10/28/20   Medina-Vargas, Monina C, NP  dapagliflozin propanediol (FARXIGA) 10 MG TABS tablet Take 1 tablet (10 mg total) by mouth daily before breakfast. 10/28/20   Medina-Vargas, Monina C, NP  divalproex (DEPAKOTE ER) 500 MG 24 hr tablet Take 3 tablets (1,500 mg total) by mouth at bedtime.  10/28/20   Medina-Vargas, Monina C, NP  feeding supplement (ENSURE ENLIVE / ENSURE PLUS) LIQD Take 237 mLs by mouth 2 (two) times daily between meals. 10/15/20   Hongalgi, Lenis Dickinson, MD  ferrous sulfate 325 (65 FE) MG tablet Take 1 tablet (325 mg total) by mouth in the morning and at bedtime. 10/28/20   Medina-Vargas, Monina C, NP  furosemide (LASIX) 20 MG tablet Take 1 tablet (20 mg total) by mouth daily. 10/28/20   Medina-Vargas, Monina C, NP  guaifenesin (ROBITUSSIN) 100 MG/5ML syrup Take 10 mLs by mouth 3 (three) times daily as needed for cough.    [provider]  hydrALAZINE (APRESOLINE) 25 MG tablet Take 0.5 tablets (12.5 mg total) by mouth 3 (three) times daily. 11/14/20   Rafael Bihari, FNP  inFLIXimab (REMICADE) 100  MG injection Infuse Remicade IV schedule 1 62m/kg every 8 weeks Premedicate with Tylenol 500-6512mby mouth and Benadryl 25-5037my mouth prior to infusion. Last PPD was on 12/2009.  11/22/10   JacMilus BanisterD  isosorbide mononitrate (IMDUR) 60 MG 24 hr tablet Take 1 tablet (60 mg total) by mouth daily. 10/28/20   Medina-Vargas, Monina C, NP  ivabradine (CORLANOR) 5 MG TABS tablet TAKE 1 TABLET(5 MG) BY MOUTH TWICE DAILY WITH A MEAL 10/28/20   Medina-Vargas, Monina C, NP  loperamide (IMODIUM) 2 MG capsule Take 1 capsule (2 mg total) by mouth 3 (three) times daily as needed for diarrhea or loose stools. 10/28/20   Medina-Vargas, Monina C, NP  magnesium oxide (MAG-OX) 400 MG tablet Take 400 mg by mouth 2 (two) times daily.    [provider]  metoprolol succinate (TOPROL-XL) 25 MG 24 hr tablet Take 1 tablet (25 mg total) by mouth in the morning and at bedtime. 10/28/20   Medina-Vargas, Monina C, NP  mirtazapine (REMERON) 7.5 MG tablet Take 1 tablet (7.5 mg total) by mouth at bedtime. TAKE 1 TABLET(7.5 MG) BY MOUTH AT BEDTIME 10/28/20   Medina-Vargas, Monina C, NP  Multiple Vitamin (MULTIVITAMIN WITH MINERALS) TABS tablet Take 1 tablet by mouth daily. 10/16/20   Hongalgi,  AnaLenis DickinsonD  omeprazole (PRILOSEC) 20 MG capsule Take 1 capsule (20 mg total) by mouth daily. 10/28/20   Medina-Vargas, Monina C, NP  polyethylene glycol (MIRALAX / GLYCOLAX) 17 g packet Take 17 g by mouth daily as needed. Give Miralax with 6 oz of water by mouth as needed for constipation 10/28/20   Medina-Vargas, Monina C, NP  potassium chloride SA (KLOR-CON) 20 MEQ tablet Take 2 tablets (40 mEq total) by mouth daily. 11/14/20   [place], JesMaricela BoNP  QUEtiapine (SEROQUEL) 100 MG tablet Take 1.5 tablets (150 mg total) by mouth at bedtime. 10/28/20   Medina-Vargas, Monina C, NP  solifenacin (VESICARE) 5 MG tablet Take 1 tablet (5 mg total) by mouth daily. 10/28/20   Medina-Vargas, Monina C, NP  spironolactone (ALDACTONE) 25 MG tablet Take 0.5 tablets (12.5 mg total) by mouth daily. 11/09/20 11/09/21  MilRafael BihariNP  tamsulosin (FLOMAX) 0.4 MG CAPS capsule Take 1 capsule (0.4 mg total) by mouth daily. 10/28/20   Medina-Vargas, Monina C, NP  vitamin B-12 (CYANOCOBALAMIN) 500 MCG tablet Take 1,000 mcg by mouth daily.     [provider]    Allergies    Azathioprine, Ciprofloxacin, Levaquin [levofloxacin in d5w], and Plendil [felodipine]  Review of Systems   Review of Systems  All other systems reviewed and are negative.  Physical Exam Updated Vital Signs BP (!) 153/85   Pulse 82   Temp 98.3 F (36.8 C) (Oral)   Resp 19   SpO2 97%   Physical Exam Vitals and nursing note reviewed.  Constitutional:      General: He is not in acute distress.    Appearance: Normal appearance. He is well-developed.  HENT:     Head: Normocephalic and atraumatic.     Mouth/Throat:     Comments: Lower dental implant is broken on the left lower jaw.  There is a moderately sharp section at the fracture site of the implant.  This is irritating the patient's tongue.  There are no abrasions or lacerations noted on the interior mucosal surfaces of the mouth or the tongue. Eyes:     Conjunctiva/sclera:  Conjunctivae normal.     Pupils: Pupils are equal, round,  and reactive to light.  Cardiovascular:     Rate and Rhythm: Normal rate and regular rhythm.     Heart sounds: Normal heart sounds.  Pulmonary:     Effort: Pulmonary effort is normal. No respiratory distress.     Breath sounds: Normal breath sounds.  Abdominal:     General: There is no distension.     Palpations: Abdomen is soft.     Tenderness: There is no abdominal tenderness.  Musculoskeletal:        General: No deformity. Normal range of motion.     Cervical back: Normal range of motion and neck supple.  Skin:    General: Skin is warm and dry.  Neurological:     General: No focal deficit present.     Mental Status: He is alert and oriented to person, place, and time.    ED Results / Procedures / Treatments   Labs (all labs ordered are listed, but only abnormal results are displayed) Labs Reviewed - No data to display  EKG None  Radiology No results found.  Procedures Procedures   Medications Ordered in ED Medications - No data to display  ED Course  I have reviewed the triage vital signs and the nursing notes.  Pertinent labs & imaging results that were available during my care of the patient were reviewed by me and considered in my medical decision making (see chart for details).    MDM Rules/Calculators/A&P                           MDM  MSE complete  Brent [name] was evaluated in Emergency Department on 11/17/2020 for the symptoms described in the history of present illness. He was evaluated in the context of the global COVID-19 pandemic, which necessitated consideration that the patient might be at risk for infection with the SARS-CoV-2 virus that causes COVID-19. Institutional protocols and algorithms that pertain to the evaluation of patients at risk for COVID-19 are in a state of rapid change based on information released by regulatory bodies including the CDC and federal and state organizations.  These policies and algorithms were followed during the patient's care in the ED.  Patient is presenting with complaint of sharp section of broken dental implant.  The implant surface is not smooth at the fracture site.  This is irritating the patient's tongue.  There is no oral lesions or lacerations noted on exam.  Patient would benefit from application of dental wax or orthodontic wax to the sharp section.  There is no dental wax in the ED for application here.  Patient is advised to obtain some from his local pharmacy and then applied himself.  Patient has already contacted his dentist who apparently is working to replace the broken dental implant.  Final Clinical Impression(s) / ED Diagnoses Final diagnoses:  Pain, dental    Rx / DC Orders ED Discharge Orders     None        Valarie Merino, MD 11/17/20 [phone]

## 2020-11-17 NOTE — ED Provider Notes (Signed)
Emergency Medicine Provider Triage Evaluation Note  Brent [name] , a 75 y.o. male  was evaluated in triage.  Pt complains of dental pain and oral irritation. Part of his lower denture implant broke off and the rough edge has been irritating his gums. Took tylenol PTA without relief. Was told by his dentist to come to the ED and to not mess with it any further at home. Is wanting someone to remove his remaining implant.  Review of Systems  Positive: Oral irritation Negative: Difficulty swallowing, trismus   Physical Exam  BP (!) 165/98 (BP Location: Left Arm)   Pulse 87   Temp 98.3 F (36.8 C) (Oral)   Resp 17   SpO2 96%  Gen:   Awake, no distress   Resp:  Normal effort  MSK:   Moves extremities without difficulty  Other:  Lower dental implant cracked  Medical Decision Making  Medically screening exam initiated at 1:47 AM.  Appropriate orders placed.  Brent [name] was informed that the remainder of the evaluation will be completed by another provider, this initial triage assessment does not replace that evaluation, and the importance of remaining in the ED until their evaluation is complete.  Oral irritation   Antonietta Breach, PA-C 11/17/20 0148    Merrily Pew, MD 11/18/20 [phone]

## 2020-11-17 NOTE — ED Triage Notes (Signed)
Pt reports he broke his partials and now has dental pain.

## 2020-11-19 ENCOUNTER — Ambulatory Visit (INDEPENDENT_AMBULATORY_CARE_PROVIDER_SITE_OTHER): Payer: No Typology Code available for payment source

## 2020-11-19 DIAGNOSIS — Z95810 Presence of automatic (implantable) cardiac defibrillator: Secondary | ICD-10-CM | POA: Diagnosis not present

## 2020-11-19 DIAGNOSIS — I5022 Chronic systolic (congestive) heart failure: Secondary | ICD-10-CM | POA: Diagnosis not present

## 2020-11-19 NOTE — [place] Notes (Signed)
EPIC Encounter for ICM Monitoring  Patient Name: Brent [name] is a 75 y.o. male Date: 11/19/2020 Primary Care Physican: Sandrea Hughs, NP Primary Cardiologist: [name] Electrophysiologist: Lovena Le Weight:  unknown       Transmission reviewed.  ICM fluid level check x 1 at the request of HF clinic.   Optivol thoracic impedance suggesting possible fluid accumulation starting 8/14 and returned close to baseline on transmission date 11/19/2020.  Threshold remains above normal but trending down.  Prescribed:  Furosemide 20 mg take 1 tablet (20 mg total) by mouth daily. Spironolactone 25 mg take 0.5 tablet daily  Recommendations: Copy sent to Allena Katz, NP at [facility] clinic for follow up after 11/14/2020 OV.  Follow-up plan: No further ICM follow up scheduled at this time.   91 day device clinic remote transmission 01/01/2021.    EP/Cardiology Office Visits: 12/26/2020 with Dr. [name].    Copy of ICM check sent to Dr. Lovena Le.   3 month ICM trend: 11/19/2020.    1 Year ICM trend:       Rosalene Billings, RN 11/19/2020 2:59 PM

## 2020-11-21 ENCOUNTER — Telehealth (HOSPITAL_COMMUNITY): Payer: Self-pay

## 2020-11-21 ENCOUNTER — Other Ambulatory Visit (HOSPITAL_COMMUNITY): Payer: Self-pay

## 2020-11-21 ENCOUNTER — Inpatient Hospital Stay (HOSPITAL_COMMUNITY): Admission: RE | Admit: 2020-11-21 | Payer: Medicare Other | Source: Ambulatory Visit

## 2020-11-21 DIAGNOSIS — I5022 Chronic systolic (congestive) heart failure: Secondary | ICD-10-CM

## 2020-11-21 NOTE — Progress Notes (Signed)
Paramedicine Encounter    Patient ID: Brent [name], male    DOB: [birth year], 75 y.o.   MRN: [id number]   Patient Care Team: [name], [name], NP as PCP - General (Family Medicine) Martinique, Peter M, MD as PCP - Cardiology (Cardiology) Larey Dresser, MD as PCP - Advanced Heart Failure (Cardiology) Elmarie Shiley, MD (Nephrology) Milus Banister, MD (Gastroenterology) Jorge Ny, LCSW as Social Worker (Licensed Clinical Social Worker)  Patient Active Problem List   Diagnosis Date Noted   SIRS (systemic inflammatory response syndrome) ([place]) 10/31/2020   Neurocognitive deficits 10/17/2020   Hypokalemia 10/08/2020   Hypomagnesemia 10/08/2020   CKD (chronic kidney disease), stage III ([place]) 10/08/2020   COVID-19 virus infection 10/08/2020   Electrolyte abnormality 05/17/2020   ICD (implantable cardioverter-defibrillator) in place 26/37/8588   Chronic systolic heart failure ([place]) 01/02/2020   Decreased appetite 10/08/2019   Weakness 10/08/2019   Hyperkalemia 50/27/7412   Chronic systolic CHF (congestive heart failure) ([place]) 08/26/2019   Type 2 diabetes mellitus with stage 3 chronic kidney disease ([place]) 08/26/2019   Chronic obstructive pulmonary disease ([place]) 06/20/2019   Ventricular tachycardia ([place]) 06/20/2019   Acute on chronic systolic (congestive) heart failure ([place]) 87/86/7672   Metabolic acidosis 09/47/0962   Orthostasis 09/07/2018   AKI (acute kidney injury) ([place])    Immunosuppressed status ([place])    Macrocytic anemia    Chronic combined systolic and diastolic congestive heart failure ([place]) 06/03/2018   Candida esophagitis ([place]) 09/22/2017   History of smoking 30 or more pack years 01/05/2017   Bipolar 1 disorder ([place])    BPH (benign prostatic hyperplasia) 03/31/2013   Anemia in chronic kidney disease 03/31/2013   Essential hypertension, benign 03/31/2013   Acute renal failure superimposed on stage 3 chronic kidney disease ([place]) 06/04/2012   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[place])  01/23/2010    Current Outpatient Medications:    acetaminophen (TYLENOL) 160 MG/5ML liquid, Take 10.2 mLs (325 mg total) by mouth every 6 (six) hours as needed for fever or pain., Disp: , Rfl:    allopurinol (ZYLOPRIM) 100 MG tablet, Take 2 tablets (200 mg total) by mouth daily., Disp: 60 tablet, Rfl: 0   apixaban (ELIQUIS) 5 MG TABS tablet, Take 1 tablet (5 mg total) by mouth 2 (two) times daily., Disp: 60 tablet, Rfl: 0   budesonide (ENTOCORT EC) 3 MG 24 hr capsule, Take 3 capsules (9 mg total) by mouth daily., Disp: 30 capsule, Rfl: 0   Calcium 500 MG tablet, Take 1 tablet (500 mg total) by mouth 1 day or 1 dose for 1 dose., Disp: 30 tablet, Rfl:    Cholecalciferol (VITAMIN D) 125 MCG (5000 UT) CAPS, Take 5,000 Units by mouth daily., Disp: 30 capsule, Rfl: 0   dapagliflozin propanediol (FARXIGA) 10 MG TABS tablet, Take 1 tablet (10 mg total) by mouth daily before breakfast., Disp: 30 tablet, Rfl: 0   divalproex (DEPAKOTE ER) 500 MG 24 hr tablet, Take 3 tablets (1,500 mg total) by mouth at bedtime., Disp: 90 tablet, Rfl: 0   feeding supplement (ENSURE ENLIVE / ENSURE PLUS) LIQD, Take 237 mLs by mouth 2 (two) times daily between meals., Disp: , Rfl:    ferrous sulfate 325 (65 FE) MG tablet, Take 1 tablet (325 mg total) by mouth in the morning and at bedtime., Disp: 30 tablet, Rfl: 0   furosemide (LASIX) 20 MG tablet, Take 1 tablet (20 mg total) by mouth daily., Disp: 30 tablet, Rfl: 0   guaifenesin (ROBITUSSIN) 100 MG/5ML syrup,  Take 10 mLs by mouth 3 (three) times daily as needed for cough., Disp: , Rfl:    hydrALAZINE (APRESOLINE) 25 MG tablet, Take 0.5 tablets (12.5 mg total) by mouth 3 (three) times daily., Disp: 45 tablet, Rfl: 3   inFLIXimab (REMICADE) 100 MG injection, Infuse Remicade IV schedule 1 29m/kg every 8 weeks Premedicate with Tylenol 500-6535mby mouth and Benadryl 25-5021my mouth prior to infusion. Last PPD was on 12/2009. , Disp: 1 each, Rfl: 6   isosorbide mononitrate (IMDUR)  60 MG 24 hr tablet, Take 1 tablet (60 mg total) by mouth daily., Disp: 30 tablet, Rfl: 0   ivabradine (CORLANOR) 5 MG TABS tablet, TAKE 1 TABLET(5 MG) BY MOUTH TWICE DAILY WITH A MEAL, Disp: 60 tablet, Rfl: 0   loperamide (IMODIUM) 2 MG capsule, Take 1 capsule (2 mg total) by mouth 3 (three) times daily as needed for diarrhea or loose stools., Disp: 30 capsule, Rfl: 0   magnesium oxide (MAG-OX) 400 MG tablet, Take 400 mg by mouth 2 (two) times daily., Disp: , Rfl:    metoprolol succinate (TOPROL-XL) 25 MG 24 hr tablet, Take 1 tablet (25 mg total) by mouth in the morning and at bedtime., Disp: 60 tablet, Rfl: 0   mirtazapine (REMERON) 7.5 MG tablet, Take 1 tablet (7.5 mg total) by mouth at bedtime. TAKE 1 TABLET(7.5 MG) BY MOUTH AT BEDTIME, Disp: 30 tablet, Rfl: 0   Multiple Vitamin (MULTIVITAMIN WITH MINERALS) TABS tablet, Take 1 tablet by mouth daily., Disp: , Rfl:    omeprazole (PRILOSEC) 20 MG capsule, Take 1 capsule (20 mg total) by mouth daily., Disp: 30 capsule, Rfl: 0   polyethylene glycol (MIRALAX / GLYCOLAX) 17 g packet, Take 17 g by mouth daily as needed. Give Miralax with 6 oz of water by mouth as needed for constipation, Disp: 14 each, Rfl: 1   potassium chloride SA (KLOR-CON) 20 MEQ tablet, Take 2 tablets (40 mEq total) by mouth daily., Disp: 60 tablet, Rfl: 3   QUEtiapine (SEROQUEL) 100 MG tablet, Take 1.5 tablets (150 mg total) by mouth at bedtime., Disp: 45 tablet, Rfl: 0   solifenacin (VESICARE) 5 MG tablet, Take 1 tablet (5 mg total) by mouth daily., Disp: 30 tablet, Rfl: 0   spironolactone (ALDACTONE) 25 MG tablet, Take 0.5 tablets (12.5 mg total) by mouth daily., Disp: 30 tablet, Rfl: 6   tamsulosin (FLOMAX) 0.4 MG CAPS capsule, Take 1 capsule (0.4 mg total) by mouth daily., Disp: 30 capsule, Rfl: 0   vitamin B-12 (CYANOCOBALAMIN) 500 MCG tablet, Take 1,000 mcg by mouth daily. , Disp: , Rfl:  Allergies  Allergen Reactions   Azathioprine Other (See Comments)    REACTION: affected  WBC "Almost died"   Ciprofloxacin Other (See Comments)    Reaction not recalled   Levaquin [Levofloxacin In D5w] Other (See Comments)    Reaction not rec   Plendil [Felodipine] Other (See Comments)    Reaction not not recalled     Social History   Socioeconomic History   Marital status: Divorced    Spouse name: Not on file   Number of children: 1   Years of education: 12   Highest education level: Not on file  Occupational History   Occupation: retired   Occupation: Veteran  Tobacco Use   Smoking status: Former    Packs/day: 1.00    Years: 49.00    Pack years: 49.00    Types: Cigarettes    Start date: 04/11/1956    Quit date:  04/17/2014    Years since quitting: 6.6   Smokeless tobacco: Never  Vaping Use   Vaping Use: Never used  Substance and Sexual Activity   Alcohol use: No    Alcohol/week: 0.0 standard drinks   Drug use: No   Sexual activity: Never  Other Topics Concern   Not on file  Social History Narrative   Not on file   Social Determinants of Health   Financial Resource Strain: Not on file  Food Insecurity: Not on file  Transportation Needs: Not on file  Physical Activity: Not on file  Stress: Not on file  Social Connections: Not on file  Intimate Partner Violence: Not on file    Physical Exam Vitals reviewed.  Constitutional:      Appearance: Normal appearance. He is normal weight.  HENT:     Head: Normocephalic.     Nose: Nose normal.     Mouth/Throat:     Mouth: Mucous membranes are moist.     Pharynx: Oropharynx is clear.  Eyes:     Conjunctiva/sclera: Conjunctivae normal.     Pupils: Pupils are equal, round, and reactive to light.  Cardiovascular:     Rate and Rhythm: Normal rate and regular rhythm.     Pulses: Normal pulses.     Heart sounds: Normal heart sounds.  Pulmonary:     Effort: Pulmonary effort is normal.     Breath sounds: Normal breath sounds.  Abdominal:     General: Abdomen is flat.     Palpations: Abdomen is soft.   Musculoskeletal:        General: No swelling. Normal range of motion.     Cervical back: Normal range of motion.     Right lower leg: Edema present.     Left lower leg: No edema.  Skin:    General: Skin is warm and dry.     Capillary Refill: Capillary refill takes less than 2 seconds.  Neurological:     General: No focal deficit present.     Mental Status: He is alert. Mental status is at baseline.  Psychiatric:        Mood and Affect: Mood normal.    Arrived for home visit for Jimmy who reports feeling much better this week. Laverna Peace has been compliant with his medications over the last week and has had an increase in his appetite. I obtained vitals and assessment.   I reviewed medications and verified same filling three pill boxes. I spoke to Jimmy's nurse about filling in the refills needed. (Potassium and Budenoside)  She and Laverna Peace are aware to contact my phone in which [facility] will have or contacting the HF clinic.   Jimmy received moms meals today also.   I will follow up in three weeks. Clinic and [facility] are aware if follow up is needed.         Future Appointments  Date Time Provider [place]  11/21/2020  3:45 PM [facility] LAB [facility] None  11/23/2020 10:00 AM [facility] None  12/26/2020 11:20 AM Larey Dresser, MD [facility] None  01/01/2021  7:00 AM [facility] DEVICE REMOTES [facility]  01/29/2021  1:00 PM [name], Dinah C, NP [facility] None  04/02/2021  7:00 AM [facility] DEVICE REMOTES [facility]  04/08/2021  9:00 AM [facility] LAB [facility] None  04/12/2021  2:15 PM [name], Dinah C, NP [facility] None  07/02/2021  7:00 AM [facility] DEVICE REMOTES [facility]     ACTION: Home visit completed

## 2020-11-21 NOTE — Telephone Encounter (Signed)
Blood count and kidney function stable. Magnesium is low. Please make sure he is taking his potassium and magnesium supplements. His magnesium supplement was just increased as of Monday. Will repeat magnesium in 10 days. If it remains low, will need to follow up with PCP.  Advised patient of the above annotation and his lab appointment has been scheduled for 12/03/20 and patient agreed understanding.

## 2020-11-23 ENCOUNTER — Non-Acute Institutional Stay (HOSPITAL_COMMUNITY)
Admission: RE | Admit: 2020-11-23 | Discharge: 2020-11-23 | Disposition: A | Discharge status: Home / Self Care | Payer: Medicare Other | Source: Ambulatory Visit | Attending: Internal Medicine | Admitting: Internal Medicine

## 2020-11-23 ENCOUNTER — Other Ambulatory Visit: Payer: Self-pay

## 2020-11-23 DIAGNOSIS — D631 Anemia in chronic kidney disease: Secondary | ICD-10-CM | POA: Insufficient documentation

## 2020-11-23 DIAGNOSIS — N189 Chronic kidney disease, unspecified: Secondary | ICD-10-CM | POA: Diagnosis not present

## 2020-11-23 LAB — HEMOGLOBIN AND HEMATOCRIT, BLOOD
HCT: 31.2 % — ABNORMAL LOW (ref 39.0–52.0)
Hemoglobin: 9.5 g/dL — ABNORMAL LOW (ref 13.0–17.0)

## 2020-11-23 LAB — IRON AND TIBC
Iron: 61 ug/dL (ref 45–182)
Saturation Ratios: 29 % (ref 17.9–39.5)
TIBC: 209 ug/dL — ABNORMAL LOW (ref 250–450)
UIBC: 148 ug/dL

## 2020-11-23 LAB — FERRITIN: Ferritin: 1000 ng/mL — ABNORMAL HIGH (ref 24–336)

## 2020-11-23 MED ORDER — EPOETIN ALFA 10000 UNIT/ML IJ SOLN
10000.0000 [IU] | INTRAMUSCULAR | Status: DC
Start: 2020-11-23 — End: 2020-11-24
  Administered 2020-11-23: 10000 [IU] via SUBCUTANEOUS
  Filled 2020-11-23: qty 1

## 2020-11-23 NOTE — Progress Notes (Signed)
PATIENT CARE CENTER NOTE    Diagnosis: Anemia associated with Chronic Renal Failure, anemia associated with renal disease     Provider: Elmarie Shiley MD     Procedure: Epoetin Alfa (Procrit) injection     Note: Patient received sub-q Procrit injection in right arm. Tolerated well. Labs drawn pre-injection and Hemoglobin was 9.5. Patient's BP within parameters at 154/86. Patient reports that he did not take BP mediations today. Discharge instructions given. Patient alert, oriented and ambulatory at discharge.

## 2020-11-27 ENCOUNTER — Telehealth (HOSPITAL_COMMUNITY): Payer: Self-pay | Admitting: *Deleted

## 2020-11-27 NOTE — Telephone Encounter (Signed)
Pt left VM stating he had labs drawn at the [facility] hospital and his K was 5.2 and they stopped his potassium.  Pt asked that we update med list.

## 2020-11-28 ENCOUNTER — Other Ambulatory Visit: Payer: Self-pay | Admitting: Family

## 2020-11-28 ENCOUNTER — Other Ambulatory Visit: Payer: Self-pay | Admitting: Adult Health

## 2020-11-30 ENCOUNTER — Other Ambulatory Visit: Payer: Self-pay | Admitting: Adult Health

## 2020-11-30 DIAGNOSIS — M1A9XX0 Chronic gout, unspecified, without tophus (tophi): Secondary | ICD-10-CM

## 2020-11-30 DIAGNOSIS — F319 Bipolar disorder, unspecified: Secondary | ICD-10-CM

## 2020-12-02 DIAGNOSIS — Z0100 Encounter for examination of eyes and vision without abnormal findings: Secondary | ICD-10-CM | POA: Diagnosis not present

## 2020-12-02 DIAGNOSIS — E119 Type 2 diabetes mellitus without complications: Secondary | ICD-10-CM | POA: Diagnosis not present

## 2020-12-03 ENCOUNTER — Other Ambulatory Visit: Payer: Self-pay

## 2020-12-03 ENCOUNTER — Ambulatory Visit (HOSPITAL_COMMUNITY)
Admission: RE | Admit: 2020-12-03 | Discharge: 2020-12-03 | Disposition: A | Discharge status: Home / Self Care | Payer: Medicare Other | Source: Ambulatory Visit | Attending: Cardiology | Admitting: Cardiology

## 2020-12-03 DIAGNOSIS — I5022 Chronic systolic (congestive) heart failure: Secondary | ICD-10-CM | POA: Insufficient documentation

## 2020-12-03 LAB — BASIC METABOLIC PANEL
Anion gap: 6 (ref 5–15)
BUN: 31 mg/dL — ABNORMAL HIGH (ref 8–23)
CO2: 25 mmol/L (ref 22–32)
Calcium: 8.6 mg/dL — ABNORMAL LOW (ref 8.9–10.3)
Chloride: 107 mmol/L (ref 98–111)
Creatinine, Ser: 2.73 mg/dL — ABNORMAL HIGH (ref 0.61–1.24)
GFR, Estimated: 24 mL/min — ABNORMAL LOW (ref >60)
Glucose, Bld: 99 mg/dL (ref 70–99)
Potassium: 4.8 mmol/L (ref 3.5–5.1)
Sodium: 138 mmol/L (ref 135–145)

## 2020-12-03 LAB — MAGNESIUM: Magnesium: 1.6 mg/dL — ABNORMAL LOW (ref 1.7–2.4)

## 2020-12-04 DIAGNOSIS — L84 Corns and callosities: Secondary | ICD-10-CM | POA: Diagnosis not present

## 2020-12-04 DIAGNOSIS — L02611 Cutaneous abscess of right foot: Secondary | ICD-10-CM | POA: Diagnosis not present

## 2020-12-04 DIAGNOSIS — B351 Tinea unguium: Secondary | ICD-10-CM | POA: Diagnosis not present

## 2020-12-04 DIAGNOSIS — L03032 Cellulitis of left toe: Secondary | ICD-10-CM | POA: Diagnosis not present

## 2020-12-04 DIAGNOSIS — I739 Peripheral vascular disease, unspecified: Secondary | ICD-10-CM | POA: Diagnosis not present

## 2020-12-12 ENCOUNTER — Other Ambulatory Visit (HOSPITAL_COMMUNITY): Payer: Self-pay

## 2020-12-12 NOTE — Progress Notes (Signed)
Paramedicine Encounter    Patient ID: Brent [name], male    DOB: [birth year], 75 y.o.   MRN: [id number]   Patient Care Team: [name], [name], NP as PCP - General (Family Medicine) Martinique, Peter M, MD as PCP - Cardiology (Cardiology) Larey Dresser, MD as PCP - Advanced Heart Failure (Cardiology) Elmarie Shiley, MD (Nephrology) Milus Banister, MD (Gastroenterology) Jorge Ny, LCSW as Social Worker (Licensed Clinical Social Worker)  Patient Active Problem List   Diagnosis Date Noted   SIRS (systemic inflammatory response syndrome) ([place]) 10/31/2020   Neurocognitive deficits 10/17/2020   Hypokalemia 10/08/2020   Hypomagnesemia 10/08/2020   CKD (chronic kidney disease), stage III ([place]) 10/08/2020   COVID-19 virus infection 10/08/2020   Electrolyte abnormality 05/17/2020   ICD (implantable cardioverter-defibrillator) in place 25/36/6440   Chronic systolic heart failure ([place]) 01/02/2020   Decreased appetite 10/08/2019   Weakness 10/08/2019   Hyperkalemia 34/74/2595   Chronic systolic CHF (congestive heart failure) ([place]) 08/26/2019   Type 2 diabetes mellitus with stage 3 chronic kidney disease ([place]) 08/26/2019   Chronic obstructive pulmonary disease ([place]) 06/20/2019   Ventricular tachycardia 06/20/2019   Acute on chronic systolic (congestive) heart failure ([place]) 63/87/5643   Metabolic acidosis 32/95/1884   Orthostasis 09/07/2018   AKI (acute kidney injury) ([place])    Immunosuppressed status ([place])    Macrocytic anemia    Chronic combined systolic and diastolic congestive heart failure ([place]) 06/03/2018   Candida esophagitis ([place]) 09/22/2017   History of smoking 30 or more pack years 01/05/2017   Bipolar 1 disorder ([place])    BPH (benign prostatic hyperplasia) 03/31/2013   Anemia in chronic kidney disease 03/31/2013   Essential hypertension, benign 03/31/2013   Acute renal failure superimposed on stage 3 chronic kidney disease ([place]) 06/04/2012   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[place])  01/23/2010    Current Outpatient Medications:    acetaminophen (TYLENOL) 160 MG/5ML liquid, Take 10.2 mLs (325 mg total) by mouth every 6 (six) hours as needed for fever or pain., Disp: , Rfl:    allopurinol (ZYLOPRIM) 100 MG tablet, TAKE 2 TABLETS(200 MG) BY MOUTH DAILY, Disp: 180 tablet, Rfl: 1   apixaban (ELIQUIS) 5 MG TABS tablet, Take 1 tablet (5 mg total) by mouth 2 (two) times daily., Disp: 60 tablet, Rfl: 0   budesonide (ENTOCORT EC) 3 MG 24 hr capsule, Take 3 capsules (9 mg total) by mouth daily., Disp: 30 capsule, Rfl: 0   Calcium 500 MG tablet, Take 1 tablet (500 mg total) by mouth 1 day or 1 dose for 1 dose., Disp: 30 tablet, Rfl:    Cholecalciferol (VITAMIN D) 125 MCG (5000 UT) CAPS, Take 5,000 Units by mouth daily., Disp: 30 capsule, Rfl: 0   dapagliflozin propanediol (FARXIGA) 10 MG TABS tablet, Take 1 tablet (10 mg total) by mouth daily before breakfast., Disp: 30 tablet, Rfl: 0   divalproex (DEPAKOTE ER) 500 MG 24 hr tablet, TAKE 3 TABLETS(1500 MG) BY MOUTH AT BEDTIME, Disp: 270 tablet, Rfl: 1   feeding supplement (ENSURE ENLIVE / ENSURE PLUS) LIQD, Take 237 mLs by mouth 2 (two) times daily between meals., Disp: , Rfl:    ferrous sulfate 325 (65 FE) MG tablet, Take 1 tablet (325 mg total) by mouth in the morning and at bedtime., Disp: 30 tablet, Rfl: 0   furosemide (LASIX) 20 MG tablet, Take 1 tablet (20 mg total) by mouth daily., Disp: 30 tablet, Rfl: 0   guaifenesin (ROBITUSSIN) 100 MG/5ML syrup, Take 10 mLs by mouth  3 (three) times daily as needed for cough., Disp: , Rfl:    hydrALAZINE (APRESOLINE) 25 MG tablet, Take 0.5 tablets (12.5 mg total) by mouth 3 (three) times daily., Disp: 45 tablet, Rfl: 3   inFLIXimab (REMICADE) 100 MG injection, Infuse Remicade IV schedule 1 73m/kg every 8 weeks Premedicate with Tylenol 500-6524mby mouth and Benadryl 25-5070my mouth prior to infusion. Last PPD was on 12/2009. , Disp: 1 each, Rfl: 6   isosorbide mononitrate (IMDUR) 60 MG 24 hr  tablet, Take 1 tablet (60 mg total) by mouth daily., Disp: 30 tablet, Rfl: 0   ivabradine (CORLANOR) 5 MG TABS tablet, TAKE 1 TABLET(5 MG) BY MOUTH TWICE DAILY WITH A MEAL, Disp: 60 tablet, Rfl: 0   loperamide (IMODIUM) 2 MG capsule, Take 1 capsule (2 mg total) by mouth 3 (three) times daily as needed for diarrhea or loose stools., Disp: 30 capsule, Rfl: 0   magnesium oxide (MAG-OX) 400 MG tablet, Take 400 mg by mouth 2 (two) times daily., Disp: , Rfl:    metoprolol succinate (TOPROL-XL) 25 MG 24 hr tablet, Take 1 tablet (25 mg total) by mouth in the morning and at bedtime., Disp: 60 tablet, Rfl: 0   mirtazapine (REMERON) 7.5 MG tablet, Take 1 tablet (7.5 mg total) by mouth at bedtime. TAKE 1 TABLET(7.5 MG) BY MOUTH AT BEDTIME, Disp: 30 tablet, Rfl: 0   Multiple Vitamin (MULTIVITAMIN WITH MINERALS) TABS tablet, Take 1 tablet by mouth daily., Disp: , Rfl:    omeprazole (PRILOSEC) 20 MG capsule, Take 1 capsule (20 mg total) by mouth daily., Disp: 30 capsule, Rfl: 0   polyethylene glycol (MIRALAX / GLYCOLAX) 17 g packet, Take 17 g by mouth daily as needed. Give Miralax with 6 oz of water by mouth as needed for constipation, Disp: 14 each, Rfl: 1   QUEtiapine (SEROQUEL) 100 MG tablet, Take 1.5 tablets (150 mg total) by mouth at bedtime., Disp: 45 tablet, Rfl: 0   solifenacin (VESICARE) 5 MG tablet, TAKE 1 TABLET(5 MG) BY MOUTH DAILY, Disp: 30 tablet, Rfl: 1   spironolactone (ALDACTONE) 25 MG tablet, Take 0.5 tablets (12.5 mg total) by mouth daily., Disp: 30 tablet, Rfl: 6   tamsulosin (FLOMAX) 0.4 MG CAPS capsule, Take 1 capsule (0.4 mg total) by mouth daily., Disp: 30 capsule, Rfl: 0   vitamin B-12 (CYANOCOBALAMIN) 500 MCG tablet, Take 1,000 mcg by mouth daily. , Disp: , Rfl:  Allergies  Allergen Reactions   Azathioprine Other (See Comments)    REACTION: affected WBC "Almost died"   Ciprofloxacin Other (See Comments)    Reaction not recalled   Levaquin [Levofloxacin In D5w] Other (See Comments)     Reaction not rec   Plendil [Felodipine] Other (See Comments)    Reaction not not recalled     Social History   Socioeconomic History   Marital status: Divorced    Spouse name: Not on file   Number of children: 1   Years of education: 12   Highest education level: Not on file  Occupational History   Occupation: retired   Occupation: Veteran  Tobacco Use   Smoking status: Former    Packs/day: 1.00    Years: 49.00    Pack years: 49.00    Types: Cigarettes    Start date: 04/11/1956    Quit date: 04/17/2014    Years since quitting: 6.6   Smokeless tobacco: Never  Vaping Use   Vaping Use: Never used  Substance and Sexual Activity   Alcohol  use: No    Alcohol/week: 0.0 standard drinks   Drug use: No   Sexual activity: Never  Other Topics Concern   Not on file  Social History Narrative   Not on file   Social Determinants of Health   Financial Resource Strain: Not on file  Food Insecurity: Not on file  Transportation Needs: Not on file  Physical Activity: Not on file  Stress: Not on file  Social Connections: Not on file  Intimate Partner Violence: Not on file    Physical Exam Vitals reviewed.  Constitutional:      Appearance: Normal appearance. He is normal weight.  HENT:     Head: Normocephalic.     Nose: Nose normal.     Mouth/Throat:     Mouth: Mucous membranes are moist.     Pharynx: Oropharynx is clear.  Eyes:     Conjunctiva/sclera: Conjunctivae normal.     Pupils: Pupils are equal, round, and reactive to light.  Cardiovascular:     Rate and Rhythm: Normal rate and regular rhythm.     Pulses: Normal pulses.     Heart sounds: Normal heart sounds.  Pulmonary:     Effort: Pulmonary effort is normal.     Breath sounds: Normal breath sounds.  Abdominal:     General: Abdomen is flat.     Palpations: Abdomen is soft.  Musculoskeletal:        General: Swelling present. Normal range of motion.     Cervical back: Normal range of motion.     Right lower  leg: Edema present.     Left lower leg: No edema.  Skin:    General: Skin is warm and dry.     Capillary Refill: Capillary refill takes less than 2 seconds.  Neurological:     General: No focal deficit present.     Mental Status: He is alert. Mental status is at baseline.  Psychiatric:        Mood and Affect: Mood normal.    Arrived for home visit for Brent [name] who was alert and oriented seated on his couch reporting to be feeling good with no complaints. He denied dizziness, chest pain, shortness of breath but did state his right foot was slightly swollen. Some mild edema noted on exam. He admitted to eating some salty snacks and dinners, we discussed the importance of a low salt diet and he verbalized understanding. Laverna Peace was compliant with his medications over the last three weeks and his home health nurse [name] has been following up weekly with him. He also has home PT with [facility] coming out and he reports doing well with that.   I reviewed medications pill box filled accordingly.  Laverna Peace has follow up with [facility] today and at 1600 they called me reporting Laverna Peace was hypotensive with dizziness and ordered Hydralazine to be stopped. Same was removed from pill box by home health nurse while I was on the phone with both of them.   I advised Brent [name] if dizziness and low BP continued to call me or his doctor immediately. He agreed with plan.   Iron needed Weds-Sat @ Bedtime in pill box. (Patient will pick up OTC IRON and place in same spots, instructions left on sticky note on box)  Home visit complete. I will see Laverna Peace in one week.         Future Appointments  Date Time Provider [place]  12/21/2020 10:00 AM [facility] None  12/26/2020 11:20 AM  Larey Dresser, MD [facility] None  01/01/2021  7:00 AM [facility] DEVICE REMOTES [facility]  01/29/2021  1:00 PM [name], Dinah C, NP [facility] None  04/02/2021  7:00 AM [facility] DEVICE REMOTES  [facility]  04/08/2021  9:00 AM [facility] LAB [facility] None  04/12/2021  2:15 PM [name], Dinah C, NP [facility] None  07/02/2021  7:00 AM [facility] DEVICE REMOTES [facility]     ACTION: Home visit completed

## 2020-12-13 ENCOUNTER — Telehealth (HOSPITAL_COMMUNITY): Payer: Self-pay | Admitting: *Deleted

## 2020-12-13 NOTE — Telephone Encounter (Signed)
Jeris Penta, EMT  P [facility] called me and ordered Hydralazine to be stopped for Brent [name] due to hypotension and dizziness. We removed same in pill box. Same in note from today's visit.

## 2020-12-19 ENCOUNTER — Other Ambulatory Visit (HOSPITAL_COMMUNITY): Payer: Self-pay

## 2020-12-19 ENCOUNTER — Telehealth (HOSPITAL_COMMUNITY): Payer: Self-pay

## 2020-12-19 ENCOUNTER — Telehealth (HOSPITAL_COMMUNITY): Payer: Self-pay | Admitting: Licensed Clinical Social Worker

## 2020-12-19 NOTE — Telephone Encounter (Signed)
Contacted by [facility] PA Tobie Poet who informed me to increase Brent [name] Magnesium dose to 456m three times daily. Brent [name] placed extra pill in the noon spot in his pill box. Call complete.

## 2020-12-19 NOTE — Progress Notes (Signed)
Paramedicine Encounter    Patient ID: Brent [name], male    DOB: May 26, 1945, 75 y.o.   MRN: [id number]   Patient Care Team: [name], [name], NP as PCP - General (Family Medicine) [place], Peter M, MD as PCP - Cardiology (Cardiology) Larey Dresser, MD as PCP - Advanced Heart Failure (Cardiology) Elmarie Shiley, MD (Nephrology) Milus Banister, MD (Gastroenterology) Jorge Ny, LCSW as Social Worker (Licensed Clinical Social Worker)  Patient Active Problem List   Diagnosis Date Noted   SIRS (systemic inflammatory response syndrome) ([place]) 10/31/2020   Neurocognitive deficits 10/17/2020   Hypokalemia 10/08/2020   Hypomagnesemia 10/08/2020   CKD (chronic kidney disease), stage III ([place]) 10/08/2020   COVID-19 virus infection 10/08/2020   Electrolyte abnormality 05/17/2020   ICD (implantable cardioverter-defibrillator) in place 47/10/6281   Chronic systolic heart failure ([place]) 01/02/2020   Decreased appetite 10/08/2019   Weakness 10/08/2019   Hyperkalemia 66/29/4765   Chronic systolic CHF (congestive heart failure) ([place]) 08/26/2019   Type 2 diabetes mellitus with stage 3 chronic kidney disease ([place]) 08/26/2019   Chronic obstructive pulmonary disease ([place]) 06/20/2019   Ventricular tachycardia 06/20/2019   Acute on chronic systolic (congestive) heart failure ([place]) 46/50/3546   Metabolic acidosis 56/81/2751   Orthostasis 09/07/2018   AKI (acute kidney injury) ([place])    Immunosuppressed status ([place])    Macrocytic anemia    Chronic combined systolic and diastolic congestive heart failure ([place]) 06/03/2018   Candida esophagitis ([place]) 09/22/2017   History of smoking 30 or more pack years 01/05/2017   Bipolar 1 disorder ([place])    BPH (benign prostatic hyperplasia) 03/31/2013   Anemia in chronic kidney disease 03/31/2013   Essential hypertension, benign 03/31/2013   Acute renal failure superimposed on stage 3 chronic kidney disease ([place]) 06/04/2012   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[place])  01/23/2010    Current Outpatient Medications:    acetaminophen (TYLENOL) 160 MG/5ML liquid, Take 10.2 mLs (325 mg total) by mouth every 6 (six) hours as needed for fever or pain., Disp: , Rfl:    allopurinol (ZYLOPRIM) 100 MG tablet, TAKE 2 TABLETS(200 MG) BY MOUTH DAILY, Disp: 180 tablet, Rfl: 1   apixaban (ELIQUIS) 5 MG TABS tablet, Take 1 tablet (5 mg total) by mouth 2 (two) times daily., Disp: 60 tablet, Rfl: 0   budesonide (ENTOCORT EC) 3 MG 24 hr capsule, Take 3 capsules (9 mg total) by mouth daily., Disp: 30 capsule, Rfl: 0   Calcium 500 MG tablet, Take 1 tablet (500 mg total) by mouth 1 day or 1 dose for 1 dose., Disp: 30 tablet, Rfl:    Cholecalciferol (VITAMIN D) 125 MCG (5000 UT) CAPS, Take 5,000 Units by mouth daily., Disp: 30 capsule, Rfl: 0   dapagliflozin propanediol (FARXIGA) 10 MG TABS tablet, Take 1 tablet (10 mg total) by mouth daily before breakfast., Disp: 30 tablet, Rfl: 0   divalproex (DEPAKOTE ER) 500 MG 24 hr tablet, TAKE 3 TABLETS(1500 MG) BY MOUTH AT BEDTIME, Disp: 270 tablet, Rfl: 1   feeding supplement (ENSURE ENLIVE / ENSURE PLUS) LIQD, Take 237 mLs by mouth 2 (two) times daily between meals., Disp: , Rfl:    ferrous sulfate 325 (65 FE) MG tablet, Take 1 tablet (325 mg total) by mouth in the morning and at bedtime., Disp: 30 tablet, Rfl: 0   furosemide (LASIX) 20 MG tablet, Take 1 tablet (20 mg total) by mouth daily., Disp: 30 tablet, Rfl: 0   guaifenesin (ROBITUSSIN) 100 MG/5ML syrup, Take 10 mLs by mouth  3 (three) times daily as needed for cough., Disp: , Rfl:    inFLIXimab (REMICADE) 100 MG injection, Infuse Remicade IV schedule 1 1m/kg every 8 weeks Premedicate with Tylenol 500-6559mby mouth and Benadryl 25-5082my mouth prior to infusion. Last PPD was on 12/2009. , Disp: 1 each, Rfl: 6   isosorbide mononitrate (IMDUR) 60 MG 24 hr tablet, Take 1 tablet (60 mg total) by mouth daily., Disp: 30 tablet, Rfl: 0   ivabradine (CORLANOR) 5 MG TABS tablet, TAKE 1 TABLET(5  MG) BY MOUTH TWICE DAILY WITH A MEAL, Disp: 60 tablet, Rfl: 0   loperamide (IMODIUM) 2 MG capsule, Take 1 capsule (2 mg total) by mouth 3 (three) times daily as needed for diarrhea or loose stools., Disp: 30 capsule, Rfl: 0   magnesium oxide (MAG-OX) 400 MG tablet, Take 400 mg by mouth 2 (two) times daily., Disp: , Rfl:    metoprolol succinate (TOPROL-XL) 25 MG 24 hr tablet, Take 1 tablet (25 mg total) by mouth in the morning and at bedtime., Disp: 60 tablet, Rfl: 0   mirtazapine (REMERON) 7.5 MG tablet, Take 1 tablet (7.5 mg total) by mouth at bedtime. TAKE 1 TABLET(7.5 MG) BY MOUTH AT BEDTIME, Disp: 30 tablet, Rfl: 0   Multiple Vitamin (MULTIVITAMIN WITH MINERALS) TABS tablet, Take 1 tablet by mouth daily., Disp: , Rfl:    omeprazole (PRILOSEC) 20 MG capsule, Take 1 capsule (20 mg total) by mouth daily., Disp: 30 capsule, Rfl: 0   polyethylene glycol (MIRALAX / GLYCOLAX) 17 g packet, Take 17 g by mouth daily as needed. Give Miralax with 6 oz of water by mouth as needed for constipation, Disp: 14 each, Rfl: 1   QUEtiapine (SEROQUEL) 100 MG tablet, Take 1.5 tablets (150 mg total) by mouth at bedtime., Disp: 45 tablet, Rfl: 0   solifenacin (VESICARE) 5 MG tablet, TAKE 1 TABLET(5 MG) BY MOUTH DAILY, Disp: 30 tablet, Rfl: 1   spironolactone (ALDACTONE) 25 MG tablet, Take 0.5 tablets (12.5 mg total) by mouth daily., Disp: 30 tablet, Rfl: 6   tamsulosin (FLOMAX) 0.4 MG CAPS capsule, Take 1 capsule (0.4 mg total) by mouth daily., Disp: 30 capsule, Rfl: 0   vitamin B-12 (CYANOCOBALAMIN) 500 MCG tablet, Take 1,000 mcg by mouth daily. , Disp: , Rfl:  Allergies  Allergen Reactions   Azathioprine Other (See Comments)    REACTION: affected WBC "Almost died"   Ciprofloxacin Other (See Comments)    Reaction not recalled   Levaquin [Levofloxacin In D5w] Other (See Comments)    Reaction not rec   Plendil [Felodipine] Other (See Comments)    Reaction not not recalled     Social History   Socioeconomic  History   Marital status: Divorced    Spouse name: Not on file   Number of children: 1   Years of education: 12   Highest education level: Not on file  Occupational History   Occupation: retired   Occupation: Veteran  Tobacco Use   Smoking status: Former    Packs/day: 1.00    Years: 49.00    Pack years: 49.00    Types: Cigarettes    Start date: 04/11/1956    Quit date: 04/17/2014    Years since quitting: 6.6   Smokeless tobacco: Never  Vaping Use   Vaping Use: Never used  Substance and Sexual Activity   Alcohol use: No    Alcohol/week: 0.0 standard drinks   Drug use: No   Sexual activity: Never  Other Topics Concern  Not on file  Social History Narrative   Not on file   Social Determinants of Health   Financial Resource Strain: Not on file  Food Insecurity: Not on file  Transportation Needs: Not on file  Physical Activity: Not on file  Stress: Not on file  Social Connections: Not on file  Intimate Partner Violence: Not on file    Physical Exam Vitals reviewed.  Constitutional:      Appearance: Normal appearance. He is normal weight.  HENT:     Head: Normocephalic.     Nose: Nose normal.     Mouth/Throat:     Mouth: Mucous membranes are moist.     Pharynx: Oropharynx is clear.  Eyes:     Conjunctiva/sclera: Conjunctivae normal.     Pupils: Pupils are equal, round, and reactive to light.  Cardiovascular:     Rate and Rhythm: Normal rate and regular rhythm.     Pulses: Normal pulses.     Heart sounds: Normal heart sounds.  Pulmonary:     Effort: Pulmonary effort is normal. No respiratory distress.     Breath sounds: Normal breath sounds.  Abdominal:     General: Abdomen is flat.     Palpations: Abdomen is soft.  Musculoskeletal:        General: No swelling. Normal range of motion.     Cervical back: Normal range of motion.     Right lower leg: No edema.     Left lower leg: No edema.  Skin:    General: Skin is warm and dry.     Capillary Refill:  Capillary refill takes less than 2 seconds.  Neurological:     General: No focal deficit present.     Mental Status: He is alert. Mental status is at baseline.  Psychiatric:        Mood and Affect: Mood normal.   Arrived for home visit for Brent [name] who reports feeling good with no complaints. He denied shortness of breath, chest pain, dizziness. No swelling or edema noted. Lung sounds clear. I reviewed medications and filled one weeks pill box for him. We reviewed appointments and confirmed same. Home visit complete. I will see him in one week.   [facility]ills:  [facility]  Isos[facility]ide Loperamide  [facility]- Solfenacin        Future Appointments  Date Time Provider [place]  12/21/2020 10:00 AM [facility] None  12/26/2020 11:20 AM Larey Dresser, MD [facility] None  01/01/2021  7:00 AM [facility] DEVICE REMOTES [facility]  01/29/2021  1:00 PM [name], Dinah C, NP [facility] None  04/02/2021  7:00 AM [facility] DEVICE REMOTES [facility]  04/08/2021  9:00 AM [facility] LAB [facility] None  04/12/2021  2:15 PM [name], Dinah C, NP [facility] None  07/02/2021  7:00 AM [facility] DEVICE REMOTES [facility]     ACTION: Home visit completed

## 2020-12-19 NOTE — Telephone Encounter (Signed)
HF Paramedicine Team Based Care Meeting  HF MD- NA  HF NP - [place] NP-C   [facility] admit within the last 30 days for heart failure? no  Medications concerns? Continues to struggle organizing his own medications and unable to get on bubble packs due to having [facility] insurance.  Transportation issues ? No- drives himself  Education needs? no  SDOH -no  Eligible for discharge? Likely not due to continued need for medication management.  Jorge Ny, LCSW Clinical Social Worker Advanced Heart Failure Clinic Desk#: [phone] Cell#: [phone]

## 2020-12-20 NOTE — Telephone Encounter (Signed)
Med list updated

## 2020-12-21 ENCOUNTER — Other Ambulatory Visit: Payer: Self-pay

## 2020-12-21 ENCOUNTER — Non-Acute Institutional Stay (HOSPITAL_COMMUNITY)
Admission: RE | Admit: 2020-12-21 | Discharge: 2020-12-21 | Disposition: A | Discharge status: Home / Self Care | Payer: No Typology Code available for payment source | Source: Ambulatory Visit | Attending: Internal Medicine | Admitting: Internal Medicine

## 2020-12-21 DIAGNOSIS — N189 Chronic kidney disease, unspecified: Secondary | ICD-10-CM | POA: Insufficient documentation

## 2020-12-21 DIAGNOSIS — D631 Anemia in chronic kidney disease: Secondary | ICD-10-CM | POA: Insufficient documentation

## 2020-12-21 LAB — IRON AND TIBC
Iron: 49 ug/dL (ref 45–182)
Saturation Ratios: 23 % (ref 17.9–39.5)
TIBC: 218 ug/dL — ABNORMAL LOW (ref 250–450)
UIBC: 169 ug/dL

## 2020-12-21 LAB — HEMOGLOBIN AND HEMATOCRIT, BLOOD
HCT: 35 % — ABNORMAL LOW (ref 39.0–52.0)
Hemoglobin: 10.8 g/dL — ABNORMAL LOW (ref 13.0–17.0)

## 2020-12-21 LAB — FERRITIN: Ferritin: 871 ng/mL — ABNORMAL HIGH (ref 24–336)

## 2020-12-21 MED ORDER — EPOETIN ALFA 10000 UNIT/ML IJ SOLN
10000.0000 [IU] | INTRAMUSCULAR | Status: DC
  Administered 2020-12-21: 10000 [IU] via SUBCUTANEOUS
  Filled 2020-12-21: qty 1

## 2020-12-21 NOTE — Progress Notes (Signed)
PATIENT CARE CENTER NOTE     Diagnosis: Anemia associated with Chronic Renal Failure, anemia associated with renal disease     Provider: Elmarie Shiley MD     Procedure: Epoetin Alfa (Procrit) injection     Note: Patient received sub-q Procrit injection in left arm. Tolerated well. Labs drawn pre-injection and Hemoglobin was 10.8. Patient's BP within parameters at 137/90.  Discharge instructions given. Patient alert, oriented and ambulatory at discharge.

## 2020-12-25 ENCOUNTER — Telehealth (HOSPITAL_COMMUNITY): Payer: Self-pay

## 2020-12-25 NOTE — Telephone Encounter (Signed)
Called to remind Brent [name] of his clinic appointment tomorrow at 11:20 with Dr. [name], he verbalized understanding and plans to be there. I reminded him to bring all medications and pill box. He agreed. Call complete.

## 2020-12-26 ENCOUNTER — Other Ambulatory Visit (HOSPITAL_COMMUNITY): Payer: Self-pay

## 2020-12-26 ENCOUNTER — Ambulatory Visit (HOSPITAL_COMMUNITY)
Admission: RE | Admit: 2020-12-26 | Discharge: 2020-12-26 | Disposition: A | Discharge status: Home / Self Care | Payer: Medicare Other | Source: Ambulatory Visit | Attending: Cardiology | Admitting: Cardiology

## 2020-12-26 ENCOUNTER — Other Ambulatory Visit: Payer: Self-pay

## 2020-12-26 ENCOUNTER — Encounter (HOSPITAL_COMMUNITY): Payer: Self-pay | Admitting: Cardiology

## 2020-12-26 VITALS — BP 136/89 | HR 78 | Wt 167.8 lb

## 2020-12-26 DIAGNOSIS — I5042 Chronic combined systolic (congestive) and diastolic (congestive) heart failure: Secondary | ICD-10-CM | POA: Diagnosis not present

## 2020-12-26 DIAGNOSIS — Z87891 Personal history of nicotine dependence: Secondary | ICD-10-CM | POA: Diagnosis not present

## 2020-12-26 DIAGNOSIS — I130 Hypertensive heart and chronic kidney disease with heart failure and stage 1 through stage 4 chronic kidney disease, or unspecified chronic kidney disease: Secondary | ICD-10-CM | POA: Insufficient documentation

## 2020-12-26 DIAGNOSIS — I5022 Chronic systolic (congestive) heart failure: Secondary | ICD-10-CM | POA: Insufficient documentation

## 2020-12-26 DIAGNOSIS — I959 Hypotension, unspecified: Secondary | ICD-10-CM | POA: Diagnosis not present

## 2020-12-26 DIAGNOSIS — Z7901 Long term (current) use of anticoagulants: Secondary | ICD-10-CM | POA: Diagnosis not present

## 2020-12-26 DIAGNOSIS — Z7952 Long term (current) use of systemic steroids: Secondary | ICD-10-CM | POA: Diagnosis not present

## 2020-12-26 DIAGNOSIS — Z7984 Long term (current) use of oral hypoglycemic drugs: Secondary | ICD-10-CM | POA: Diagnosis not present

## 2020-12-26 DIAGNOSIS — N183 Chronic kidney disease, stage 3 unspecified: Secondary | ICD-10-CM | POA: Diagnosis not present

## 2020-12-26 DIAGNOSIS — Z95810 Presence of automatic (implantable) cardiac defibrillator: Secondary | ICD-10-CM | POA: Diagnosis not present

## 2020-12-26 DIAGNOSIS — I480 Paroxysmal atrial fibrillation: Secondary | ICD-10-CM | POA: Diagnosis not present

## 2020-12-26 DIAGNOSIS — J449 Chronic obstructive pulmonary disease, unspecified: Secondary | ICD-10-CM | POA: Diagnosis not present

## 2020-12-26 LAB — BRAIN NATRIURETIC PEPTIDE: B Natriuretic Peptide: 345.3 pg/mL — ABNORMAL HIGH (ref 0.0–100.0)

## 2020-12-26 LAB — BASIC METABOLIC PANEL
Anion gap: 5 (ref 5–15)
BUN: 24 mg/dL — ABNORMAL HIGH (ref 8–23)
CO2: 28 mmol/L (ref 22–32)
Calcium: 8.6 mg/dL — ABNORMAL LOW (ref 8.9–10.3)
Chloride: 107 mmol/L (ref 98–111)
Creatinine, Ser: 2.15 mg/dL — ABNORMAL HIGH (ref 0.61–1.24)
GFR, Estimated: 31 mL/min — ABNORMAL LOW (ref >60)
Glucose, Bld: 103 mg/dL — ABNORMAL HIGH (ref 70–99)
Potassium: 4 mmol/L (ref 3.5–5.1)
Sodium: 140 mmol/L (ref 135–145)

## 2020-12-26 MED ORDER — METOPROLOL SUCCINATE ER 25 MG PO TB24: 37.5000 mg | ORAL_TABLET | Freq: Two times a day (BID) | ORAL | 6 refills | Status: DC

## 2020-12-26 NOTE — Progress Notes (Signed)
PCP: Sandrea Hughs, NP Cardiology: Dr. Leandro Reasoner Brent [name] is a 75 y.o. with a history of Crohn's disease, celiac disease, HTN, COPD, prior tobacco use, CKD 3, chronic anemia, and bipolar disorder. Diagnosed with systolic HF in 1/79 (echo with EF 30-35%).     Admitted 3/11-3/16/20 with SOB and hypoxia. Found to have PNA and required BIPAP and antibiotics. Blood cultures were negative. Echo showed newly reduced EF 30-35%. Cardiology consulted and he had nuclear stress test, which was read as "high risk" due to low EF. Per Dr Sallyanne Kuster, did not think changes were indicative of coronary disease and cath was not pursued due this and due to CKD. HF meds optimized. Limited with CKD3. He had one short run of NSVT. Also had AKI on CKD3, but creatinine improved to baseline 1.77 on day of discharge.   He was seen in ED 05/12/18 with SOB after misplacing his nebulizer. BNP was elevated, so he was given 40 mg IV lasix and discharged with lasix 20 mg BID. Referred to HF clinic.   He was admitted again in 7/20 with weakness, orthostatic hypotension, and AKI.  Cardiac meds were held.  He was noted to be severely deconditioned, and he was discharged to SNF.  He remained in SNF x 2 wks and is now home.   Echo was done in 8/20, EF 20-25% with mild LVH and normal RV.  He had RHC in 8/20 showing normal filling pressures and good cardiac output (surprisingly).   LHC/RHC in 3/21 showed no significant CAD, low filling pressures, preserved cardiac output. Echo in 9/21 with EF 25-30%.  Medtronic ICD placed in 11/21.   He returns today for followup of CHF.  He is stable symptomatically.  Taking all his medications.  He had to stop hydralazine due to hypotension and lightheadedness.  No significant exertional dyspnea when walking up stairs, walking around grocery store, or doing ADLs.  No chest pain.  No orthopnea/PND.    ECG (personally reviewed): NSR, LVH  Labs (7/20): K 5.1, creatinine 2.25 Labs (8/20): K 4.8,  creatinine 2.65 Labs (9/20): hgb 9.7 Labs (12/20): K 4.1, creatinine 1.76 Labs (3/21): K 5, creatinine 2.25 Labs (6/21): hgb 12 Labs (11/21): K 4.3, creatinine 2.62, hgb 12.2 Labs (10/22): hgb 10.8, K 4.8, creatinine 2.73  Medtronic device interrogation: Stable thoracic impedance, no AF/VT, >2 hrs activity/day   PMH: 1. Crohns disease: Gets Remicade.  2. Celiac disease.  3. COPD: Prior smoker.  - CT chest (7/20) with mild emphysema.  - PFTs (9/20): Mild obstruction, mild restriction.  - High resolution CT chest (10/20): No ILD, +chronic bronchitis.  4. CKD: Stage 3.  5. HTN 6. Bipolar disorder 7. H/o BPPV 8. Gout 9. Chronic systolic CHF: Nonischemic cardiomyopathy.  Medtronic ICD.  - Echo (3/20): EF 30-35%, mild LVH, normal RV.  - Cardiolite (3/20): EF 28%, no ischemia, fixed defects in anteroseptal and inferolateral walls.  - Echo (8/20): EF 20-25%, diffuse hypokinesis, mild LVH, normal RV size and systolic function, normal IVC size.  - RHC (8/20): mean RA 3, PA 21/10, mean PCWP 5, CI 3.2 Fick and 3.5 Thermo - RHC/LHC (3/21): No significant CAD; mean RA 1, PA 20/6, mean PCWP 6, CI 4.57 - Echo (9/21): EF 25-30%, normal RV.  10. Atrial fibrillation: Paroxysmal.   Social History   Socioeconomic History   Marital status: Divorced    Spouse name: Not on file   Number of children: 1   Years of education: 12   Highest  education level: Not on file  Occupational History   Occupation: retired   Occupation: Veteran  Tobacco Use   Smoking status: Former    Packs/day: 1.00    Years: 49.00    Pack years: 49.00    Types: Cigarettes    Start date: 04/11/1956    Quit date: 04/17/2014    Years since quitting: 6.6   Smokeless tobacco: Never  Vaping Use   Vaping Use: Never used  Substance and Sexual Activity   Alcohol use: No    Alcohol/week: 0.0 standard drinks   Drug use: No   Sexual activity: Never  Other Topics Concern   Not on file  Social History Narrative   Not on file    Social Determinants of Health   Financial Resource Strain: Not on file  Food Insecurity: Not on file  Transportation Needs: Not on file  Physical Activity: Not on file  Stress: Not on file  Social Connections: Not on file  Intimate Partner Violence: Not on file   Family History  Problem Relation Age of Onset   Diabetes Mother        maternal grandmother   Uterine cancer Mother    Emphysema Father    Pneumonia Maternal Grandmother    Colon cancer Neg Hx    ROS: All systems reviewed and negative except as per HPI.   Current Outpatient Medications  Medication Sig Dispense Refill   acetaminophen (TYLENOL) 160 MG/5ML liquid Take 10.2 mLs (325 mg total) by mouth every 6 (six) hours as needed for fever or pain.     allopurinol (ZYLOPRIM) 100 MG tablet TAKE 2 TABLETS(200 MG) BY MOUTH DAILY 180 tablet 1   apixaban (ELIQUIS) 5 MG TABS tablet Take 1 tablet (5 mg total) by mouth 2 (two) times daily. 60 tablet 0   budesonide (ENTOCORT EC) 3 MG 24 hr capsule Take 3 capsules (9 mg total) by mouth daily. 30 capsule 0   Calcium 500 MG tablet Take 1 tablet (500 mg total) by mouth 1 day or 1 dose for 1 dose. 30 tablet    Cholecalciferol (VITAMIN D) 125 MCG (5000 UT) CAPS Take 5,000 Units by mouth daily. 30 capsule 0   dapagliflozin propanediol (FARXIGA) 10 MG TABS tablet Take 1 tablet (10 mg total) by mouth daily before breakfast. 30 tablet 0   divalproex (DEPAKOTE ER) 500 MG 24 hr tablet TAKE 3 TABLETS(1500 MG) BY MOUTH AT BEDTIME 270 tablet 1   feeding supplement (ENSURE ENLIVE / ENSURE PLUS) LIQD Take 237 mLs by mouth 2 (two) times daily between meals.     ferrous sulfate 325 (65 FE) MG tablet Take 1 tablet (325 mg total) by mouth in the morning and at bedtime. 30 tablet 0   furosemide (LASIX) 20 MG tablet Take 1 tablet (20 mg total) by mouth daily. 30 tablet 0   inFLIXimab (REMICADE) 100 MG injection Infuse Remicade IV schedule 1 45m/kg every 8 weeks Premedicate with Tylenol 500-6580mby  mouth and Benadryl 25-5053my mouth prior to infusion. Last PPD was on 12/2009.  1 each 6   isosorbide mononitrate (IMDUR) 60 MG 24 hr tablet Take 1 tablet (60 mg total) by mouth daily. 30 tablet 0   ivabradine (CORLANOR) 5 MG TABS tablet TAKE 1 TABLET(5 MG) BY MOUTH TWICE DAILY WITH A MEAL 60 tablet 0   loperamide (IMODIUM) 2 MG capsule Take 1 capsule (2 mg total) by mouth 3 (three) times daily as needed for diarrhea or loose stools. 30 capsule  0   magnesium oxide (MAG-OX) 400 MG tablet Take 400 mg by mouth in the morning, at noon, and at bedtime.     mirtazapine (REMERON) 7.5 MG tablet Take 1 tablet (7.5 mg total) by mouth at bedtime. TAKE 1 TABLET(7.5 MG) BY MOUTH AT BEDTIME 30 tablet 0   Multiple Vitamin (MULTIVITAMIN WITH MINERALS) TABS tablet Take 1 tablet by mouth daily.     omeprazole (PRILOSEC) 20 MG capsule Take 1 capsule (20 mg total) by mouth daily. 30 capsule 0   polyethylene glycol (MIRALAX / GLYCOLAX) 17 g packet Take 17 g by mouth daily as needed. Give Miralax with 6 oz of water by mouth as needed for constipation 14 each 1   QUEtiapine (SEROQUEL) 100 MG tablet Take 1.5 tablets (150 mg total) by mouth at bedtime. 45 tablet 0   solifenacin (VESICARE) 5 MG tablet TAKE 1 TABLET(5 MG) BY MOUTH DAILY 30 tablet 1   spironolactone (ALDACTONE) 25 MG tablet Take 0.5 tablets (12.5 mg total) by mouth daily. 30 tablet 6   tamsulosin (FLOMAX) 0.4 MG CAPS capsule Take 1 capsule (0.4 mg total) by mouth daily. 30 capsule 0   vitamin B-12 (CYANOCOBALAMIN) 500 MCG tablet Take 1,000 mcg by mouth daily.      metoprolol succinate (TOPROL-XL) 25 MG 24 hr tablet Take 1.5 tablets (37.5 mg total) by mouth in the morning and at bedtime. 90 tablet 6   No current facility-administered medications for this encounter.   BP 136/89   Pulse 78   Wt 76.1 kg (167 lb 12.8 oz)   SpO2 98%   BMI 20.48 kg/m  General: NAD Neck: No JVD, no thyromegaly or thyroid nodule.  Lungs: Clear to auscultation bilaterally  with normal respiratory effort. CV: Nondisplaced PMI.  Heart regular S1/S2, no S3/S4, no murmur.  No peripheral edema.  No carotid bruit.  Normal pedal pulses.  Abdomen: Soft, nontender, no hepatosplenomegaly, no distention.  Skin: Intact without lesions or rashes.  Neurologic: Alert and oriented x 3.  Psych: Normal affect. Extremities: No clubbing or cyanosis.  HEENT: Normal.   Assessment/Plan: 1. Chronic systolic CHF: Nonischemic cardiomyoapthy.  Diagnosed by echo in 3/20, EF 30-35%.  LHC in 3/21 with no significant CAD. He has no chest pain.  Medication titration has been limited by orthostasis/low BP.  Echo in 8/20 showed EF 20-25% with mild LVH and normal RV function.  With resting sinus tachycardia, I was concerned for low output HF, but RHC in 8/20 and again in 3/21 showed normal cardiac output and filling pressures. Echo in 9/21 with EF 25-30%, Medtronic ICD placed.  NYHA class II, not volume overloaded by exam or Optivol.   Weight stable. Has had trouble with low BP and orthostasis in the past, but BP good today.  Also, med titration has been limited by CKD.  - Cannot tolerate hydralazine due to orthostatic symptoms.  - Increase Toprol XL to 37.5 mg bid.  - Continue ivabradine 5 mg bid.   - Lasix 20 mg daily.  BMET today.  - No ACEI/ARB/ARNI with elevated creatinine.  - Continue Farxiga 10 mg daily.  - Continue spironolactone 12.5 daily.  - Repeat echo at followup in 3 months.  - He would be a difficult candidate for advanced therapies with deconditioning, renal dysfunction, and lives alone.  - Enroll in [facility] with Sharman Cheek.  2. CKD: Stage 3.  BMET today.  3. COPD: Mild emphysema on prior chest CT.  PFTs in 9/20 showed mild obstruction and  mild restriction.  High resolution CT chest 10/20 showed no ILD, +chronic bronchitis.  - Repeat CT chest to follow small lung nodule given smoking history.   Followup with HF pharmacist in 3 wks for 2 visits to titrate meds, then see me  in 2 months.    Loralie Champagne 12/26/2020

## 2020-12-26 NOTE — Progress Notes (Signed)
Paramedicine Encounter    Patient ID: Brent [name], male    DOB: Jun 17, 1945, 74 y.o.   MRN: [id number]  Met with [name] today where he was alert and oriented reporting to be feeling good with no complaints.   Dr. [name] evaluated patient after vitals, and EKG were obtained.   Dr. [name] elected to increase Metoprolol to 37.49m BID  Meds were reviewed and confirmed. Pill box filled for one week.   Appointments reviewed and confirmed.   Labs drawn and clinic visit complete. I will see patient in one week in the home.   Refills: [facility]: -Seroquel   [facility]: -Budenoside -Isosorbide     ACTION: Home visit completed

## 2020-12-26 NOTE — Patient Instructions (Signed)
Increase Metoprolol XL to 37.5 mg (1 & 1/2 tabs) Twice daily   Labs done today, we will call you for abnormal results  You have been referred to the Home Device Monitoring program, they will call you  Your physician recommends that you schedule a follow-up appointment in: 3 months with echocardiogram  If you have any questions or concerns before your next appointment please send [name] a message through mychart or call our office at [phone].    TO LEAVE A MESSAGE FOR THE NURSE SELECT OPTION 2, PLEASE LEAVE A MESSAGE INCLUDING: YOUR NAME DATE OF BIRTH CALL BACK NUMBER REASON FOR CALL**this is important as we prioritize the call backs  YOU WILL RECEIVE A CALL BACK THE SAME DAY AS LONG AS YOU CALL BEFORE 4:00 PM  At the [facility], you and your health needs are our priority. As part of our continuing mission to provide you with exceptional heart care, we have created designated Provider Care Teams. These Care Teams include your primary Cardiologist (physician) and Advanced Practice Providers (APPs- Physician Assistants and Nurse Practitioners) who all work together to provide you with the care you need, when you need it.   You may see any of the following providers on your designated Care Team at your next follow up: Dr Glori Bickers Dr Haynes Kerns, NP Lyda Jester, [place] [facility] [place], [place] Audry Riles, PharmD   Please be sure to bring in all your medications bottles to every appointment.

## 2020-12-27 ENCOUNTER — Other Ambulatory Visit: Payer: Self-pay | Admitting: Adult Health

## 2020-12-27 ENCOUNTER — Other Ambulatory Visit (HOSPITAL_COMMUNITY): Payer: Self-pay | Admitting: Cardiology

## 2020-12-27 DIAGNOSIS — F319 Bipolar disorder, unspecified: Secondary | ICD-10-CM

## 2020-12-28 MED ORDER — QUETIAPINE FUMARATE 100 MG PO TABS: 150.0000 mg | ORAL_TABLET | Freq: Every day | ORAL | 5 refills | Status: DC

## 2021-01-01 ENCOUNTER — Ambulatory Visit (INDEPENDENT_AMBULATORY_CARE_PROVIDER_SITE_OTHER): Payer: Medicare Other

## 2021-01-01 DIAGNOSIS — Z95810 Presence of automatic (implantable) cardiac defibrillator: Secondary | ICD-10-CM

## 2021-01-01 LAB — CUP PACEART REMOTE DEVICE CHECK
Battery Remaining Longevity: 132 mo
Battery Voltage: 3.03 V
Brady Statistic RV Percent Paced: 0 %
Date Time Interrogation Session: 20221108022702
HighPow Impedance: 65 Ohm
Implantable Lead Implant Date: 20211108
Implantable Lead Location: 753860
Implantable Pulse Generator Implant Date: 20211108
Lead Channel Impedance Value: 342 Ohm
Lead Channel Impedance Value: 418 Ohm
Lead Channel Pacing Threshold Amplitude: 0.75 V
Lead Channel Pacing Threshold Pulse Width: 0.4 ms
Lead Channel Sensing Intrinsic Amplitude: 14.5 mV
Lead Channel Sensing Intrinsic Amplitude: 14.5 mV
Lead Channel Setting Pacing Amplitude: 2 V
Lead Channel Setting Pacing Pulse Width: 0.4 ms
Lead Channel Setting Sensing Sensitivity: 0.3 mV

## 2021-01-02 ENCOUNTER — Other Ambulatory Visit (HOSPITAL_COMMUNITY): Payer: Self-pay

## 2021-01-02 NOTE — Progress Notes (Signed)
Paramedicine Encounter    Patient ID: Brent [name], male    DOB: 12/16/1945, 75 y.o.   MRN: [id number]   Patient Care Team: [name], [name], NP as PCP - General (Family Medicine) [place]ique, Peter M, MD as PCP - Cardiology (Cardiology) Larey Dresser, MD as PCP - Advanced Heart Failure (Cardiology) Elmarie Shiley, MD (Nephrology) Milus Banister, MD (Gastroenterology) Jorge Ny, LCSW as Social Worker (Licensed Clinical Social Worker)  Patient Active Problem List   Diagnosis Date Noted   SIRS (systemic inflammatory response syndrome) ([place]) 10/31/2020   Neurocognitive deficits 10/17/2020   Hypokalemia 10/08/2020   Hypomagnesemia 10/08/2020   CKD (chronic kidney disease), stage III ([place]) 10/08/2020   COVID-19 virus infection 10/08/2020   Electrolyte abnormality 05/17/2020   ICD (implantable cardioverter-defibrillator) in place 35/57/3220   Chronic systolic heart failure ([place]) 01/02/2020   Decreased appetite 10/08/2019   Weakness 10/08/2019   Hyperkalemia 25/42/7062   Chronic systolic CHF (congestive heart failure) ([place]) 08/26/2019   Type 2 diabetes mellitus with stage 3 chronic kidney disease ([place]) 08/26/2019   Chronic obstructive pulmonary disease ([place]) 06/20/2019   Ventricular tachycardia 06/20/2019   Acute on chronic systolic (congestive) heart failure ([place]) 37/62/8315   Metabolic acidosis 17/61/6073   Orthostasis 09/07/2018   AKI (acute kidney injury) ([place])    Immunosuppressed status ([place])    Macrocytic anemia    Chronic combined systolic and diastolic congestive heart failure ([place]) 06/03/2018   Candida esophagitis ([place]) 09/22/2017   History of smoking 30 or more pack years 01/05/2017   Bipolar 1 disorder ([name])    BPH (benign prostatic hyperplasia) 03/31/2013   Anemia in chronic kidney disease 03/31/2013   Essential hypertension, benign 03/31/2013   Acute renal failure superimposed on stage 3 chronic kidney disease ([place]) 06/04/2012   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[place])  01/23/2010    Current Outpatient Medications:    acetaminophen (TYLENOL) 160 MG/5ML liquid, Take 10.2 mLs (325 mg total) by mouth every 6 (six) hours as needed for fever or pain., Disp: , Rfl:    allopurinol (ZYLOPRIM) 100 MG tablet, TAKE 2 TABLETS(200 MG) BY MOUTH DAILY, Disp: 180 tablet, Rfl: 1   apixaban (ELIQUIS) 5 MG TABS tablet, Take 1 tablet (5 mg total) by mouth 2 (two) times daily., Disp: 60 tablet, Rfl: 0   budesonide (ENTOCORT EC) 3 MG 24 hr capsule, Take 3 capsules (9 mg total) by mouth daily., Disp: 30 capsule, Rfl: 0   Calcium 500 MG tablet, Take 1 tablet (500 mg total) by mouth 1 day or 1 dose for 1 dose., Disp: 30 tablet, Rfl:    Cholecalciferol (VITAMIN D) 125 MCG (5000 UT) CAPS, Take 5,000 Units by mouth daily., Disp: 30 capsule, Rfl: 0   dapagliflozin propanediol (FARXIGA) 10 MG TABS tablet, Take 1 tablet (10 mg total) by mouth daily before breakfast., Disp: 30 tablet, Rfl: 0   divalproex (DEPAKOTE ER) 500 MG 24 hr tablet, TAKE 3 TABLETS(1500 MG) BY MOUTH AT BEDTIME, Disp: 270 tablet, Rfl: 1   feeding supplement (ENSURE ENLIVE / ENSURE PLUS) LIQD, Take 237 mLs by mouth 2 (two) times daily between meals., Disp: , Rfl:    ferrous sulfate 325 (65 FE) MG tablet, Take 1 tablet (325 mg total) by mouth in the morning and at bedtime., Disp: 30 tablet, Rfl: 0   furosemide (LASIX) 20 MG tablet, Take 1 tablet (20 mg total) by mouth daily., Disp: 30 tablet, Rfl: 0   inFLIXimab (REMICADE) 100 MG injection, Infuse Remicade IV schedule 1  42m/kg every 8 weeks Premedicate with Tylenol 500-6532mby mouth and Benadryl 25-5014my mouth prior to infusion. Last PPD was on 12/2009. , Disp: 1 each, Rfl: 6   isosorbide mononitrate (IMDUR) 60 MG 24 hr tablet, Take 1 tablet (60 mg total) by mouth daily., Disp: 30 tablet, Rfl: 0   ivabradine (CORLANOR) 5 MG TABS tablet, TAKE 1 TABLET(5 MG) BY MOUTH TWICE DAILY WITH A MEAL, Disp: 60 tablet, Rfl: 0   loperamide (IMODIUM) 2 MG capsule, Take 1 capsule (2 mg  total) by mouth 3 (three) times daily as needed for diarrhea or loose stools., Disp: 30 capsule, Rfl: 0   magnesium oxide (MAG-OX) 400 MG tablet, Take 400 mg by mouth in the morning, at noon, and at bedtime., Disp: , Rfl:    metoprolol succinate (TOPROL-XL) 25 MG 24 hr tablet, Take 1.5 tablets (37.5 mg total) by mouth in the morning and at bedtime., Disp: 90 tablet, Rfl: 6   mirtazapine (REMERON) 7.5 MG tablet, Take 1 tablet (7.5 mg total) by mouth at bedtime. TAKE 1 TABLET(7.5 MG) BY MOUTH AT BEDTIME, Disp: 30 tablet, Rfl: 0   Multiple Vitamin (MULTIVITAMIN WITH MINERALS) TABS tablet, Take 1 tablet by mouth daily., Disp: , Rfl:    omeprazole (PRILOSEC) 20 MG capsule, Take 1 capsule (20 mg total) by mouth daily., Disp: 30 capsule, Rfl: 0   polyethylene glycol (MIRALAX / GLYCOLAX) 17 g packet, Take 17 g by mouth daily as needed. Give Miralax with 6 oz of water by mouth as needed for constipation, Disp: 14 each, Rfl: 1   QUEtiapine (SEROQUEL) 100 MG tablet, Take 1.5 tablets (150 mg total) by mouth at bedtime., Disp: 45 tablet, Rfl: 5   solifenacin (VESICARE) 5 MG tablet, TAKE 1 TABLET(5 MG) BY MOUTH DAILY, Disp: 30 tablet, Rfl: 1   spironolactone (ALDACTONE) 25 MG tablet, Take 0.5 tablets (12.5 mg total) by mouth daily., Disp: 30 tablet, Rfl: 6   tamsulosin (FLOMAX) 0.4 MG CAPS capsule, Take 1 capsule (0.4 mg total) by mouth daily., Disp: 30 capsule, Rfl: 0   vitamin B-12 (CYANOCOBALAMIN) 500 MCG tablet, Take 1,000 mcg by mouth daily. , Disp: , Rfl:  Allergies  Allergen Reactions   Azathioprine Other (See Comments)    REACTION: affected WBC "Almost died"   Ciprofloxacin Other (See Comments)    Reaction not recalled   Levaquin [Levofloxacin In D5w] Other (See Comments)    Reaction not rec   Plendil [Felodipine] Other (See Comments)    Reaction not not recalled     Social History   Socioeconomic History   Marital status: Divorced    Spouse name: Not on file   Number of children: 1   Years  of education: 12   Highest education level: Not on file  Occupational History   Occupation: retired   Occupation: Veteran  Tobacco Use   Smoking status: Former    Packs/day: 1.00    Years: 49.00    Pack years: 49.00    Types: Cigarettes    Start date: 04/11/1956    Quit date: 04/17/2014    Years since quitting: 6.7   Smokeless tobacco: Never  Vaping Use   Vaping Use: Never used  Substance and Sexual Activity   Alcohol use: No    Alcohol/week: 0.0 standard drinks   Drug use: No   Sexual activity: Never  Other Topics Concern   Not on file  Social History Narrative   Not on file   Social Determinants of Health  Financial Resource Strain: Not on file  Food Insecurity: Not on file  Transportation Needs: Not on file  Physical Activity: Not on file  Stress: Not on file  Social Connections: Not on file  Intimate Partner Violence: Not on file    Physical Exam Vitals reviewed.  Constitutional:      Appearance: Normal appearance. He is normal weight.  HENT:     Head: Normocephalic.     Nose: Nose normal.     Mouth/Throat:     Mouth: Mucous membranes are moist.     Pharynx: Oropharynx is clear.  Eyes:     Conjunctiva/sclera: Conjunctivae normal.     Pupils: Pupils are equal, round, and reactive to light.  Cardiovascular:     Rate and Rhythm: Normal rate and regular rhythm.     Pulses: Normal pulses.     Heart sounds: Normal heart sounds.  Pulmonary:     Effort: Pulmonary effort is normal.     Breath sounds: Normal breath sounds.  Abdominal:     General: Abdomen is flat.     Palpations: Abdomen is soft.  Musculoskeletal:        General: No swelling. Normal range of motion.     Cervical back: Normal range of motion.     Right lower leg: No edema.     Left lower leg: No edema.  Skin:    General: Skin is warm and dry.     Capillary Refill: Capillary refill takes less than 2 seconds.  Neurological:     General: No focal deficit present.     Mental Status: He is  alert. Mental status is at baseline.  Psychiatric:        Mood and Affect: Mood normal.   Arrived for home visit for Brent [name] who reports feeling good with no complaints. He states he has no chest pain, dizziness, shortness of breath or trouble with medication compliance over the last week. Pill box was empty meaning he took all appropriate doses.   I reviewed chart, obtained vitals and assessment. No swelling noted. Lung sounds clear. Medications reviewed and confirmed. Pill box for one week filled accordingly.   Refills: Loperamide Isosorbide Budeonoside  Appointments reviewed and confirmed.   Home visit complete. I will see Brent [name] in one week.       Future Appointments  Date Time Provider [place]  01/21/2021 10:00 AM [facility] RM 2 [facility] None  01/29/2021  1:00 PM [name], Dinah C, NP PSC-PSC None  03/27/2021 11:00 AM [facility] ECHO OP 1 [facility]  03/27/2021 12:00 PM Larey Dresser, MD [facility] None  04/02/2021  7:00 AM [facility] DEVICE REMOTES [facility]  04/08/2021  9:00 AM PSC-PSC LAB P[facility]-PSC None  04/12/2021  2:15 PM [name], [name], NP PSC-PSC None  07/02/2021  7:00 AM [facility] DEVICE REMOTES [facility]     ACTION: Home visit completed

## 2021-01-04 ENCOUNTER — Telehealth: Payer: Self-pay

## 2021-01-04 NOTE — Telephone Encounter (Signed)
Referred to [facility] clinic by Dr [name].  Spoke with patient and agreeable to monthly follow up.  Advised monitor should be by bedside in order for it to automatically transmit a report during sleep hours of 12 midnight and 6 AM.  Patient confirmed monitor is at bedside. Advised will receive a call after the transmission is reviewed to provide results.  Provided ICM number and explained should call if experiencing any fluid symptoms such as weight gain, shortness of breath or extremity/abdominal swelling.  ICM remote transmission scheduled for 02/04/2021.

## 2021-01-04 NOTE — Telephone Encounter (Signed)
-----   Message from Scarlette Calico, RN sent at 12/26/2020  4:59 PM EDT ----- Marykay Lex, Dr [name] wanted you to start following him please, thanks

## 2021-01-09 ENCOUNTER — Other Ambulatory Visit (HOSPITAL_COMMUNITY): Payer: Self-pay

## 2021-01-09 ENCOUNTER — Other Ambulatory Visit: Payer: Self-pay | Admitting: Adult Health

## 2021-01-09 DIAGNOSIS — R630 Anorexia: Secondary | ICD-10-CM

## 2021-01-09 NOTE — Progress Notes (Signed)
Paramedicine Encounter    Patient ID: Brent [name], male    DOB: 06-02-1945, 75 y.o.   MRN: [id number]   Patient Care Team: [name], [name], NP as PCP - General (Family Medicine) [place], Peter M, MD as PCP - Cardiology (Cardiology) Larey Dresser, MD as PCP - Advanced Heart Failure (Cardiology) Elmarie Shiley, MD (Nephrology) Milus Banister, MD (Gastroenterology) Jorge Ny, LCSW as Social Worker (Licensed Clinical Social Worker)  Patient Active Problem List   Diagnosis Date Noted   SIRS (systemic inflammatory response syndrome) ([place]) 10/31/2020   Neurocognitive deficits 10/17/2020   Hypokalemia 10/08/2020   Hypomagnesemia 10/08/2020   CKD (chronic kidney disease), stage III ([place]) 10/08/2020   COVID-19 virus infection 10/08/2020   Electrolyte abnormality 05/17/2020   ICD (implantable cardioverter-defibrillator) in place 57/02/7791   Chronic systolic heart failure ([place]) 01/02/2020   Decreased appetite 10/08/2019   Weakness 10/08/2019   Hyperkalemia 90/30/0923   Chronic systolic CHF (congestive heart failure) ([place]) 08/26/2019   Type 2 diabetes mellitus with stage 3 chronic kidney disease ([place]) 08/26/2019   Chronic obstructive pulmonary disease ([place]) 06/20/2019   Ventricular tachycardia 06/20/2019   Acute on chronic systolic (congestive) heart failure ([place]) 30/08/6224   Metabolic acidosis 33/35/4562   Orthostasis 09/07/2018   AKI (acute kidney injury) ([place])    Immunosuppressed status ([place])    Macrocytic anemia    Chronic combined systolic and diastolic congestive heart failure ([place]) 06/03/2018   Candida esophagitis ([place]) 09/22/2017   History of smoking 30 or more pack years 01/05/2017   Bipolar 1 disorder ([place])    BPH (benign prostatic hyperplasia) 03/31/2013   Anemia in chronic kidney disease 03/31/2013   Essential hypertension, benign 03/31/2013   Acute renal failure superimposed on stage 3 chronic kidney disease ([place]) 06/04/2012   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[employer])  01/23/2010    Current Outpatient Medications:    acetaminophen (TYLENOL) 160 MG/5ML liquid, Take 10.2 mLs (325 mg total) by mouth every 6 (six) hours as needed for fever or pain., Disp: , Rfl:    allopurinol (ZYLOPRIM) 100 MG tablet, TAKE 2 TABLETS(200 MG) BY MOUTH DAILY, Disp: 180 tablet, Rfl: 1   apixaban (ELIQUIS) 5 MG TABS tablet, Take 1 tablet (5 mg total) by mouth 2 (two) times daily., Disp: 60 tablet, Rfl: 0   budesonide (ENTOCORT EC) 3 MG 24 hr capsule, Take 3 capsules (9 mg total) by mouth daily., Disp: 30 capsule, Rfl: 0   Calcium 500 MG tablet, Take 1 tablet (500 mg total) by mouth 1 day or 1 dose for 1 dose., Disp: 30 tablet, Rfl:    Cholecalciferol (VITAMIN D) 125 MCG (5000 UT) CAPS, Take 5,000 Units by mouth daily., Disp: 30 capsule, Rfl: 0   dapagliflozin propanediol (FARXIGA) 10 MG TABS tablet, Take 1 tablet (10 mg total) by mouth daily before breakfast., Disp: 30 tablet, Rfl: 0   divalproex (DEPAKOTE ER) 500 MG 24 hr tablet, TAKE 3 TABLETS(1500 MG) BY MOUTH AT BEDTIME, Disp: 270 tablet, Rfl: 1   feeding supplement (ENSURE ENLIVE / ENSURE PLUS) LIQD, Take 237 mLs by mouth 2 (two) times daily between meals., Disp: , Rfl:    ferrous sulfate 325 (65 FE) MG tablet, Take 1 tablet (325 mg total) by mouth in the morning and at bedtime., Disp: 30 tablet, Rfl: 0   furosemide (LASIX) 20 MG tablet, Take 1 tablet (20 mg total) by mouth daily., Disp: 30 tablet, Rfl: 0   inFLIXimab (REMICADE) 100 MG injection, Infuse Remicade IV schedule 1  63m/kg every 8 weeks Premedicate with Tylenol 500-6528mby mouth and Benadryl 25-5067my mouth prior to infusion. Last PPD was on 12/2009. , Disp: 1 each, Rfl: 6   isosorbide mononitrate (IMDUR) 60 MG 24 hr tablet, Take 1 tablet (60 mg total) by mouth daily., Disp: 30 tablet, Rfl: 0   ivabradine (CORLANOR) 5 MG TABS tablet, TAKE 1 TABLET(5 MG) BY MOUTH TWICE DAILY WITH A MEAL, Disp: 60 tablet, Rfl: 0   loperamide (IMODIUM) 2 MG capsule, Take 1 capsule (2 mg  total) by mouth 3 (three) times daily as needed for diarrhea or loose stools., Disp: 30 capsule, Rfl: 0   magnesium oxide (MAG-OX) 400 MG tablet, Take 400 mg by mouth in the morning, at noon, and at bedtime., Disp: , Rfl:    metoprolol succinate (TOPROL-XL) 25 MG 24 hr tablet, Take 1.5 tablets (37.5 mg total) by mouth in the morning and at bedtime., Disp: 90 tablet, Rfl: 6   mirtazapine (REMERON) 7.5 MG tablet, Take 1 tablet (7.5 mg total) by mouth at bedtime. TAKE 1 TABLET(7.5 MG) BY MOUTH AT BEDTIME, Disp: 30 tablet, Rfl: 0   Multiple Vitamin (MULTIVITAMIN WITH MINERALS) TABS tablet, Take 1 tablet by mouth daily., Disp: , Rfl:    omeprazole (PRILOSEC) 20 MG capsule, Take 1 capsule (20 mg total) by mouth daily., Disp: 30 capsule, Rfl: 0   polyethylene glycol (MIRALAX / GLYCOLAX) 17 g packet, Take 17 g by mouth daily as needed. Give Miralax with 6 oz of water by mouth as needed for constipation, Disp: 14 each, Rfl: 1   QUEtiapine (SEROQUEL) 100 MG tablet, Take 1.5 tablets (150 mg total) by mouth at bedtime., Disp: 45 tablet, Rfl: 5   solifenacin (VESICARE) 5 MG tablet, TAKE 1 TABLET(5 MG) BY MOUTH DAILY, Disp: 30 tablet, Rfl: 1   spironolactone (ALDACTONE) 25 MG tablet, Take 0.5 tablets (12.5 mg total) by mouth daily., Disp: 30 tablet, Rfl: 6   tamsulosin (FLOMAX) 0.4 MG CAPS capsule, Take 1 capsule (0.4 mg total) by mouth daily., Disp: 30 capsule, Rfl: 0   vitamin B-12 (CYANOCOBALAMIN) 500 MCG tablet, Take 1,000 mcg by mouth daily. , Disp: , Rfl:  Allergies  Allergen Reactions   Azathioprine Other (See Comments)    REACTION: affected WBC "Almost died"   Ciprofloxacin Other (See Comments)    Reaction not recalled   Levaquin [Levofloxacin In D5w] Other (See Comments)    Reaction not rec   Plendil [Felodipine] Other (See Comments)    Reaction not not recalled     Social History   Socioeconomic History   Marital status: Divorced    Spouse name: Not on file   Number of children: 1   Years  of education: 12   Highest education level: Not on file  Occupational History   Occupation: retired   Occupation: Veteran  Tobacco Use   Smoking status: Former    Packs/day: 1.00    Years: 49.00    Pack years: 49.00    Types: Cigarettes    Start date: 04/11/1956    Quit date: 04/17/2014    Years since quitting: 6.7   Smokeless tobacco: Never  Vaping Use   Vaping Use: Never used  Substance and Sexual Activity   Alcohol use: No    Alcohol/week: 0.0 standard drinks   Drug use: No   Sexual activity: Never  Other Topics Concern   Not on file  Social History Narrative   Not on file   Social Determinants of Health  Financial Resource Strain: Not on file  Food Insecurity: Not on file  Transportation Needs: Not on file  Physical Activity: Not on file  Stress: Not on file  Social Connections: Not on file  Intimate Partner Violence: Not on file    Physical Exam Vitals reviewed.  Constitutional:      Appearance: Normal appearance. He is normal weight.  HENT:     Head: Normocephalic.     Nose: Nose normal.     Mouth/Throat:     Mouth: Mucous membranes are moist.     Pharynx: Oropharynx is clear.  Eyes:     Conjunctiva/sclera: Conjunctivae normal.     Pupils: Pupils are equal, round, and reactive to light.  Cardiovascular:     Rate and Rhythm: Normal rate and regular rhythm.     Pulses: Normal pulses.     Heart sounds: Normal heart sounds.  Pulmonary:     Effort: Pulmonary effort is normal.     Breath sounds: Normal breath sounds.  Abdominal:     General: Abdomen is flat.     Palpations: Abdomen is soft.  Musculoskeletal:        General: No swelling. Normal range of motion.     Cervical back: Normal range of motion.     Right lower leg: No edema.     Left lower leg: No edema.  Skin:    General: Skin is warm and dry.     Capillary Refill: Capillary refill takes less than 2 seconds.  Neurological:     General: No focal deficit present.     Mental Status: He is  alert. Mental status is at baseline.  Psychiatric:        Mood and Affect: Mood normal.    Arrived for home visit for Brent [name] who is alert and oriented reporting to be feeling good. Brent [name] was compliant with his medications over the last week except for one noon and one evening dose on Saturday. Brent [name] reports he has not been weighing due to his scale being broken, he reports he will get a new one this week. I obtained vitals and assessment as noted. No swelling, lungs clear. Vitals normal. I reviewed medications and confirmed same filling pill box accordingly. Home visit complete. I will see Brent [name] in one week.   -Brent [name] is getting home health aide services 6hrs per day three days a week. He also has a home RN who comes out. This is through the [place].   Refills  -Remeron     Future Appointments  Date Time Provider [place]  01/21/2021 10:00 AM [facility] RM 2 [facility] None  01/29/2021  1:00 PM [name], Dinah C, NP PSC-[facility] None  02/04/2021  7:25 AM [facility] DEVICE REMOTES [facility]  03/27/2021 11:00 AM MC ECHO OP 1 [facility]  03/27/2021 12:00 PM Larey Dresser, MD MC-[facility] None  04/02/2021  7:00 AM [facility] DEVICE REMOTES [facility]  04/08/2021  9:00 AM PSC-PSC LAB [facility]PSC None  04/12/2021  2:15 PM [name], Dinah C, NP PSC-PSC None  07/02/2021  7:00 AM [facility] DEVICE REMOTES [facility]     ACTION: Home visit completed

## 2021-01-09 NOTE — Progress Notes (Signed)
Remote ICD transmission.   

## 2021-01-16 ENCOUNTER — Other Ambulatory Visit (HOSPITAL_COMMUNITY): Payer: Self-pay

## 2021-01-16 MED ORDER — SPIRONOLACTONE 25 MG PO TABS: 12.5000 mg | ORAL_TABLET | Freq: Every day | ORAL | 3 refills | Status: DC

## 2021-01-16 NOTE — Progress Notes (Signed)
Paramedicine Encounter    Patient ID: Brent [name], male    DOB: 1945-06-13, 75 y.o.   MRN: [id number]   Patient Care Team: [name], [name], NP as PCP - General (Family Medicine) Martinique, Peter M, MD as PCP - Cardiology (Cardiology) Larey Dresser, MD as PCP - Advanced Heart Failure (Cardiology) Elmarie Shiley, MD (Nephrology) Milus Banister, MD (Gastroenterology) Jorge Ny, LCSW as Social Worker (Licensed Clinical Social Worker)  Patient Active Problem List   Diagnosis Date Noted   SIRS (systemic inflammatory response syndrome) ([place]) 10/31/2020   Neurocognitive deficits 10/17/2020   Hypokalemia 10/08/2020   Hypomagnesemia 10/08/2020   CKD (chronic kidney disease), stage III ([place]) 10/08/2020   COVID-19 virus infection 10/08/2020   Electrolyte abnormality 05/17/2020   ICD (implantable cardioverter-defibrillator) in place 58/85/0277   Chronic systolic heart failure ([place]) 01/02/2020   Decreased appetite 10/08/2019   Weakness 10/08/2019   Hyperkalemia 41/28/7867   Chronic systolic CHF (congestive heart failure) ([place]) 08/26/2019   Type 2 diabetes mellitus with stage 3 chronic kidney disease ([place]) 08/26/2019   Chronic obstructive pulmonary disease ([place]) 06/20/2019   Ventricular tachycardia 06/20/2019   Acute on chronic systolic (congestive) heart failure ([place]) 67/20/9470   Metabolic acidosis 96/28/3662   Orthostasis 09/07/2018   AKI (acute kidney injury) ([place])    Immunosuppressed status ([place])    Macrocytic anemia    Chronic combined systolic and diastolic congestive heart failure ([place]) 06/03/2018   Candida esophagitis ([place]) 09/22/2017   History of smoking 30 or more pack years 01/05/2017   Bipolar 1 disorder ([place])    BPH (benign prostatic hyperplasia) 03/31/2013   Anemia in chronic kidney disease 03/31/2013   Essential hypertension, benign 03/31/2013   Acute renal failure superimposed on stage 3 chronic kidney disease ([place]) 06/04/2012   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[place])  01/23/2010    Current Outpatient Medications:    acetaminophen (TYLENOL) 160 MG/5ML liquid, Take 10.2 mLs (325 mg total) by mouth every 6 (six) hours as needed for fever or pain., Disp: , Rfl:    allopurinol (ZYLOPRIM) 100 MG tablet, TAKE 2 TABLETS(200 MG) BY MOUTH DAILY, Disp: 180 tablet, Rfl: 1   apixaban (ELIQUIS) 5 MG TABS tablet, Take 1 tablet (5 mg total) by mouth 2 (two) times daily., Disp: 60 tablet, Rfl: 0   budesonide (ENTOCORT EC) 3 MG 24 hr capsule, Take 3 capsules (9 mg total) by mouth daily., Disp: 30 capsule, Rfl: 0   Calcium 500 MG tablet, Take 1 tablet (500 mg total) by mouth 1 day or 1 dose for 1 dose., Disp: 30 tablet, Rfl:    Cholecalciferol (VITAMIN D) 125 MCG (5000 UT) CAPS, Take 5,000 Units by mouth daily., Disp: 30 capsule, Rfl: 0   dapagliflozin propanediol (FARXIGA) 10 MG TABS tablet, Take 1 tablet (10 mg total) by mouth daily before breakfast., Disp: 30 tablet, Rfl: 0   divalproex (DEPAKOTE ER) 500 MG 24 hr tablet, TAKE 3 TABLETS(1500 MG) BY MOUTH AT BEDTIME, Disp: 270 tablet, Rfl: 1   feeding supplement (ENSURE ENLIVE / ENSURE PLUS) LIQD, Take 237 mLs by mouth 2 (two) times daily between meals., Disp: , Rfl:    ferrous sulfate 325 (65 FE) MG tablet, Take 1 tablet (325 mg total) by mouth in the morning and at bedtime., Disp: 30 tablet, Rfl: 0   furosemide (LASIX) 20 MG tablet, Take 1 tablet (20 mg total) by mouth daily., Disp: 30 tablet, Rfl: 0   inFLIXimab (REMICADE) 100 MG injection, Infuse Remicade IV schedule 1  5m/kg every 8 weeks Premedicate with Tylenol 500-6522mby mouth and Benadryl 25-5052my mouth prior to infusion. Last PPD was on 12/2009. , Disp: 1 each, Rfl: 6   isosorbide mononitrate (IMDUR) 60 MG 24 hr tablet, Take 1 tablet (60 mg total) by mouth daily., Disp: 30 tablet, Rfl: 0   ivabradine (CORLANOR) 5 MG TABS tablet, TAKE 1 TABLET(5 MG) BY MOUTH TWICE DAILY WITH A MEAL, Disp: 60 tablet, Rfl: 0   loperamide (IMODIUM) 2 MG capsule, Take 1 capsule (2 mg  total) by mouth 3 (three) times daily as needed for diarrhea or loose stools., Disp: 30 capsule, Rfl: 0   magnesium oxide (MAG-OX) 400 MG tablet, Take 400 mg by mouth in the morning, at noon, and at bedtime., Disp: , Rfl:    metoprolol succinate (TOPROL-XL) 25 MG 24 hr tablet, Take 1.5 tablets (37.5 mg total) by mouth in the morning and at bedtime., Disp: 90 tablet, Rfl: 6   mirtazapine (REMERON) 7.5 MG tablet, TAKE 1 TABLET(7.5 MG) BY MOUTH AT BEDTIME, Disp: 30 tablet, Rfl: 0   Multiple Vitamin (MULTIVITAMIN WITH MINERALS) TABS tablet, Take 1 tablet by mouth daily., Disp: , Rfl:    omeprazole (PRILOSEC) 20 MG capsule, Take 1 capsule (20 mg total) by mouth daily., Disp: 30 capsule, Rfl: 0   polyethylene glycol (MIRALAX / GLYCOLAX) 17 g packet, Take 17 g by mouth daily as needed. Give Miralax with 6 oz of water by mouth as needed for constipation, Disp: 14 each, Rfl: 1   QUEtiapine (SEROQUEL) 100 MG tablet, Take 1.5 tablets (150 mg total) by mouth at bedtime., Disp: 45 tablet, Rfl: 5   solifenacin (VESICARE) 5 MG tablet, TAKE 1 TABLET(5 MG) BY MOUTH DAILY, Disp: 30 tablet, Rfl: 1   spironolactone (ALDACTONE) 25 MG tablet, Take 0.5 tablets (12.5 mg total) by mouth daily., Disp: 30 tablet, Rfl: 6   tamsulosin (FLOMAX) 0.4 MG CAPS capsule, Take 1 capsule (0.4 mg total) by mouth daily., Disp: 30 capsule, Rfl: 0   vitamin B-12 (CYANOCOBALAMIN) 500 MCG tablet, Take 1,000 mcg by mouth daily. , Disp: , Rfl:  Allergies  Allergen Reactions   Azathioprine Other (See Comments)    REACTION: affected WBC "Almost died"   Ciprofloxacin Other (See Comments)    Reaction not recalled   Levaquin [Levofloxacin In D5w] Other (See Comments)    Reaction not rec   Plendil [Felodipine] Other (See Comments)    Reaction not not recalled     Social History   Socioeconomic History   Marital status: Divorced    Spouse name: Not on file   Number of children: 1   Years of education: 12   Highest education level: Not  on file  Occupational History   Occupation: retired   Occupation: Veteran  Tobacco Use   Smoking status: Former    Packs/day: 1.00    Years: 49.00    Pack years: 49.00    Types: Cigarettes    Start date: 04/11/1956    Quit date: 04/17/2014    Years since quitting: 6.7   Smokeless tobacco: Never  Vaping Use   Vaping Use: Never used  Substance and Sexual Activity   Alcohol use: No    Alcohol/week: 0.0 standard drinks   Drug use: No   Sexual activity: Never  Other Topics Concern   Not on file  Social History Narrative   Not on file   Social Determinants of Health   Financial Resource Strain: Not on file  Food Insecurity:  Not on file  Transportation Needs: Not on file  Physical Activity: Not on file  Stress: Not on file  Social Connections: Not on file  Intimate Partner Violence: Not on file    Physical Exam Vitals reviewed.  Constitutional:      Appearance: Normal appearance. He is normal weight.  HENT:     Head: Normocephalic.     Nose: Nose normal.     Mouth/Throat:     Mouth: Mucous membranes are moist.     Pharynx: Oropharynx is clear.  Eyes:     Conjunctiva/sclera: Conjunctivae normal.     Pupils: Pupils are equal, round, and reactive to light.  Cardiovascular:     Rate and Rhythm: Normal rate and regular rhythm.     Pulses: Normal pulses.     Heart sounds: Normal heart sounds.  Pulmonary:     Effort: Pulmonary effort is normal.     Breath sounds: Normal breath sounds.  Abdominal:     General: Abdomen is flat.     Palpations: Abdomen is soft.  Musculoskeletal:        General: No swelling. Normal range of motion.     Cervical back: Normal range of motion.     Right lower leg: No edema.     Left lower leg: No edema.  Skin:    General: Skin is warm and dry.     Capillary Refill: Capillary refill takes less than 2 seconds.  Neurological:     General: No focal deficit present.     Mental Status: He is alert. Mental status is at baseline.  Psychiatric:         Mood and Affect: Mood normal.   Arrived for home visit for Brent [name] who reports feeling good with no complaints. He states he has had no shortness of breath, dizziness, or chest pain in the last week. He has been med compliant over the last week. Vitals obtained.  Assessment obtained, no edema noted. Lung sounds clear. No JVD or abdominal distention. [name] reports constipation over the last week. I encouraged him to be sure to be getting enough fiber in his daily diet. He agreed and we placed one Loperamide in his morning row of pill box rather than twice daily. He agreed with plan and knows he can add in one if needed in the evenings for relief of diarrhea. Meds reviewed and pill box filled accordingly for one week. We reviewed appointments and confirmed same. Home visit complete. I will see Brent [name] in one week.   Refills -send Spironolactone to [place]      Future Appointments  Date Time Provider [place]  01/21/2021 10:00 AM [facility] RM 2 [facility] None  01/29/2021  1:00 PM [name], Dinah C, NP [facility] None  02/04/2021  7:25 AM [facility] DEVICE REMOTES [facility]  03/27/2021 11:00 AM [facility] ECHO OP 1 [facility]  03/27/2021 12:00 PM Larey Dresser, MD [facility] None  04/02/2021  7:00 AM [facility] DEVICE REMOTES [facility]  04/08/2021  9:00 AM P[facility] LAB [facility] None  04/12/2021  2:15 PM [name], Dinah C, NP [facility] None  07/02/2021  7:00 AM [facility] DEVICE REMOTES [facility]     ACTION: Home visit completed

## 2021-01-21 ENCOUNTER — Other Ambulatory Visit: Payer: Self-pay

## 2021-01-21 ENCOUNTER — Non-Acute Institutional Stay (HOSPITAL_COMMUNITY)
Admission: RE | Admit: 2021-01-21 | Discharge: 2021-01-21 | Disposition: A | Discharge status: Home / Self Care | Payer: Medicare Other | Source: Ambulatory Visit | Attending: Internal Medicine | Admitting: Internal Medicine

## 2021-01-21 DIAGNOSIS — I5022 Chronic systolic (congestive) heart failure: Secondary | ICD-10-CM | POA: Insufficient documentation

## 2021-01-21 DIAGNOSIS — D631 Anemia in chronic kidney disease: Secondary | ICD-10-CM | POA: Insufficient documentation

## 2021-01-21 DIAGNOSIS — I130 Hypertensive heart and chronic kidney disease with heart failure and stage 1 through stage 4 chronic kidney disease, or unspecified chronic kidney disease: Secondary | ICD-10-CM | POA: Diagnosis not present

## 2021-01-21 DIAGNOSIS — N1832 Chronic kidney disease, stage 3b: Secondary | ICD-10-CM | POA: Insufficient documentation

## 2021-01-21 LAB — HEMOGLOBIN AND HEMATOCRIT, BLOOD
HCT: 34.6 % — ABNORMAL LOW (ref 39.0–52.0)
Hemoglobin: 10.6 g/dL — ABNORMAL LOW (ref 13.0–17.0)

## 2021-01-21 LAB — FERRITIN: Ferritin: 730 ng/mL — ABNORMAL HIGH (ref 24–336)

## 2021-01-21 LAB — IRON AND TIBC
Iron: 37 ug/dL — ABNORMAL LOW (ref 45–182)
Saturation Ratios: 18 % (ref 17.9–39.5)
TIBC: 206 ug/dL — ABNORMAL LOW (ref 250–450)
UIBC: 169 ug/dL

## 2021-01-21 MED ORDER — EPOETIN ALFA 10000 UNIT/ML IJ SOLN
10000.0000 [IU] | Freq: Once | INTRAMUSCULAR | Status: AC
  Administered 2021-01-21: 12:00:00 10000 [IU] via SUBCUTANEOUS
  Filled 2021-01-21: qty 1

## 2021-01-21 NOTE — Progress Notes (Signed)
PATIENT CARE CENTER NOTE   Diagnosis: Anemia associated with Chronic Renal Failure; anemia associated with renal disease     Provider: Elmarie Shiley MD     Procedure: Procrit injection 10,000 units and lab draw     Note: Patient labs drawn (H/H, iron panel, ferritin) and patient received sub-q Procrit injection in right arm. Patient tolerated injection well. Pre-injection hemoglobin was 10.6. BP was 163/96 prior to injection, pt states he forgot to take his BP meds today. Order parameters of 180/110 not exceeded, no intervention needed. Patient instructed to take BP meds once home, verbalized understanding. Pt instructed to make monthly appointment at front desk prior to leaving, verbalized understanding. Vital signs stable , pt alert, oriented and ambulatory at discharge .

## 2021-01-23 ENCOUNTER — Other Ambulatory Visit (HOSPITAL_COMMUNITY): Payer: Self-pay

## 2021-01-23 NOTE — Progress Notes (Signed)
Paramedicine Encounter    Patient ID: Brent [name], male    DOB: 1945/05/16, 75 y.o.   MRN: [id number]   Patient Care Team: [name], [name], NP as PCP - General (Family Medicine) Martinique, Peter M, MD as PCP - Cardiology (Cardiology) Larey Dresser, MD as PCP - Advanced Heart Failure (Cardiology) Elmarie Shiley, MD (Nephrology) Milus Banister, MD (Gastroenterology) Jorge Ny, LCSW as Social Worker (Licensed Clinical Social Worker)  Patient Active Problem List   Diagnosis Date Noted   SIRS (systemic inflammatory response syndrome) ([place]) 10/31/2020   Neurocognitive deficits 10/17/2020   Hypokalemia 10/08/2020   Hypomagnesemia 10/08/2020   CKD (chronic kidney disease), stage III ([place]) 10/08/2020   COVID-19 virus infection 10/08/2020   Electrolyte abnormality 05/17/2020   ICD (implantable cardioverter-defibrillator) in place 32/20/2542   Chronic systolic heart failure ([place]) 01/02/2020   Decreased appetite 10/08/2019   Weakness 10/08/2019   Hyperkalemia 70/62/3762   Chronic systolic CHF (congestive heart failure) ([place]) 08/26/2019   Type 2 diabetes mellitus with stage 3 chronic kidney disease ([place]) 08/26/2019   Chronic obstructive pulmonary disease ([place]) 06/20/2019   Ventricular tachycardia 06/20/2019   Acute on chronic systolic (congestive) heart failure ([place]) 83/15/1761   Metabolic acidosis 60/73/7106   Orthostasis 09/07/2018   AKI (acute kidney injury) ([place])    Immunosuppressed status ([place])    Macrocytic anemia    Chronic combined systolic and diastolic congestive heart failure ([place]) 06/03/2018   Candida esophagitis ([place]) 09/22/2017   History of smoking 30 or more pack years 01/05/2017   Bipolar 1 disorder ([place])    BPH (benign prostatic hyperplasia) 03/31/2013   Anemia in chronic kidney disease 03/31/2013   Essential hypertension, benign 03/31/2013   Acute renal failure superimposed on stage 3 chronic kidney disease ([place]) 06/04/2012   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[place])  01/23/2010    Current Outpatient Medications:    acetaminophen (TYLENOL) 160 MG/5ML liquid, Take 10.2 mLs (325 mg total) by mouth every 6 (six) hours as needed for fever or pain., Disp: , Rfl:    allopurinol (ZYLOPRIM) 100 MG tablet, TAKE 2 TABLETS(200 MG) BY MOUTH DAILY, Disp: 180 tablet, Rfl: 1   apixaban (ELIQUIS) 5 MG TABS tablet, Take 1 tablet (5 mg total) by mouth 2 (two) times daily., Disp: 60 tablet, Rfl: 0   budesonide (ENTOCORT EC) 3 MG 24 hr capsule, Take 3 capsules (9 mg total) by mouth daily., Disp: 30 capsule, Rfl: 0   Calcium 500 MG tablet, Take 1 tablet (500 mg total) by mouth 1 day or 1 dose for 1 dose., Disp: 30 tablet, Rfl:    Cholecalciferol (VITAMIN D) 125 MCG (5000 UT) CAPS, Take 5,000 Units by mouth daily., Disp: 30 capsule, Rfl: 0   dapagliflozin propanediol (FARXIGA) 10 MG TABS tablet, Take 1 tablet (10 mg total) by mouth daily before breakfast., Disp: 30 tablet, Rfl: 0   divalproex (DEPAKOTE ER) 500 MG 24 hr tablet, TAKE 3 TABLETS(1500 MG) BY MOUTH AT BEDTIME, Disp: 270 tablet, Rfl: 1   feeding supplement (ENSURE ENLIVE / ENSURE PLUS) LIQD, Take 237 mLs by mouth 2 (two) times daily between meals., Disp: , Rfl:    ferrous sulfate 325 (65 FE) MG tablet, Take 1 tablet (325 mg total) by mouth in the morning and at bedtime., Disp: 30 tablet, Rfl: 0   furosemide (LASIX) 20 MG tablet, Take 1 tablet (20 mg total) by mouth daily., Disp: 30 tablet, Rfl: 0   inFLIXimab (REMICADE) 100 MG injection, Infuse Remicade IV schedule 1  40m/kg every 8 weeks Premedicate with Tylenol 500-6533mby mouth and Benadryl 25-5067my mouth prior to infusion. Last PPD was on 12/2009. , Disp: 1 each, Rfl: 6   isosorbide mononitrate (IMDUR) 60 MG 24 hr tablet, Take 1 tablet (60 mg total) by mouth daily., Disp: 30 tablet, Rfl: 0   ivabradine (CORLANOR) 5 MG TABS tablet, TAKE 1 TABLET(5 MG) BY MOUTH TWICE DAILY WITH A MEAL, Disp: 60 tablet, Rfl: 0   loperamide (IMODIUM) 2 MG capsule, Take 1 capsule (2 mg  total) by mouth 3 (three) times daily as needed for diarrhea or loose stools., Disp: 30 capsule, Rfl: 0   magnesium oxide (MAG-OX) 400 MG tablet, Take 400 mg by mouth in the morning, at noon, and at bedtime., Disp: , Rfl:    metoprolol succinate (TOPROL-XL) 25 MG 24 hr tablet, Take 1.5 tablets (37.5 mg total) by mouth in the morning and at bedtime., Disp: 90 tablet, Rfl: 6   mirtazapine (REMERON) 7.5 MG tablet, TAKE 1 TABLET(7.5 MG) BY MOUTH AT BEDTIME, Disp: 30 tablet, Rfl: 0   Multiple Vitamin (MULTIVITAMIN WITH MINERALS) TABS tablet, Take 1 tablet by mouth daily., Disp: , Rfl:    omeprazole (PRILOSEC) 20 MG capsule, Take 1 capsule (20 mg total) by mouth daily., Disp: 30 capsule, Rfl: 0   polyethylene glycol (MIRALAX / GLYCOLAX) 17 g packet, Take 17 g by mouth daily as needed. Give Miralax with 6 oz of water by mouth as needed for constipation, Disp: 14 each, Rfl: 1   QUEtiapine (SEROQUEL) 100 MG tablet, Take 1.5 tablets (150 mg total) by mouth at bedtime., Disp: 45 tablet, Rfl: 5   solifenacin (VESICARE) 5 MG tablet, TAKE 1 TABLET(5 MG) BY MOUTH DAILY, Disp: 30 tablet, Rfl: 1   spironolactone (ALDACTONE) 25 MG tablet, Take 0.5 tablets (12.5 mg total) by mouth daily., Disp: 45 tablet, Rfl: 3   tamsulosin (FLOMAX) 0.4 MG CAPS capsule, Take 1 capsule (0.4 mg total) by mouth daily., Disp: 30 capsule, Rfl: 0   vitamin B-12 (CYANOCOBALAMIN) 500 MCG tablet, Take 1,000 mcg by mouth daily. , Disp: , Rfl:  Allergies  Allergen Reactions   Azathioprine Other (See Comments)    REACTION: affected WBC "Almost died"   Ciprofloxacin Other (See Comments)    Reaction not recalled   Levaquin [Levofloxacin In D5w] Other (See Comments)    Reaction not rec   Plendil [Felodipine] Other (See Comments)    Reaction not not recalled     Social History   Socioeconomic History   Marital status: Divorced    Spouse name: Not on file   Number of children: 1   Years of education: 12   Highest education level: Not  on file  Occupational History   Occupation: retired   Occupation: Veteran  Tobacco Use   Smoking status: Former    Packs/day: 1.00    Years: 49.00    Pack years: 49.00    Types: Cigarettes    Start date: 04/11/1956    Quit date: 04/17/2014    Years since quitting: 6.7   Smokeless tobacco: Never  Vaping Use   Vaping Use: Never used  Substance and Sexual Activity   Alcohol use: No    Alcohol/week: 0.0 standard drinks   Drug use: No   Sexual activity: Never  Other Topics Concern   Not on file  Social History Narrative   Not on file   Social Determinants of Health   Financial Resource Strain: Not on file  Food Insecurity:  Not on file  Transportation Needs: Not on file  Physical Activity: Not on file  Stress: Not on file  Social Connections: Not on file  Intimate Partner Violence: Not on file    Physical Exam Vitals reviewed.  Constitutional:      Appearance: Normal appearance. He is normal weight.  HENT:     Head: Normocephalic.     Nose: Nose normal.     Mouth/Throat:     Mouth: Mucous membranes are moist.     Pharynx: Oropharynx is clear.  Eyes:     Conjunctiva/sclera: Conjunctivae normal.     Pupils: Pupils are equal, round, and reactive to light.  Cardiovascular:     Rate and Rhythm: Normal rate and regular rhythm.     Pulses: Normal pulses.     Heart sounds: Normal heart sounds.  Pulmonary:     Effort: Pulmonary effort is normal.     Breath sounds: Normal breath sounds.  Abdominal:     General: Abdomen is flat.     Palpations: Abdomen is soft.  Musculoskeletal:        General: No swelling. Normal range of motion.     Cervical back: Normal range of motion.     Right lower leg: No edema.     Left lower leg: No edema.  Skin:    General: Skin is warm and dry.     Capillary Refill: Capillary refill takes less than 2 seconds.  Neurological:     General: No focal deficit present.     Mental Status: He is alert. Mental status is at baseline.  Psychiatric:         Mood and Affect: Mood normal.    Arrived for home visit for Brent [name] who reports feeling good with no complaints today. He stated he got his covid booster and flu shot yesterday. He plans to get his shingles vaccine next week. Vitals were obtained. BP elevated, he just had taken his medications prior to my arrival at 1100. No pain, no shortness of breath, no dizziness. No swelling noted. Lungs clear, weight only up one pound. Meds were reviewed and verified. Pill box filled accordingly. We reviewed upcoming appointments. Home visit complete. I will see Brent [name] in one week.   Refills at [facility]: Solifenacin Seroquel Spironolactone      Future Appointments  Date Time Provider [place]  01/29/2021  1:00 PM [name], [name], NP [facility] None  02/04/2021  7:25 AM [facility] DEVICE REMOTES [facility]  02/20/2021 11:00 AM [facility] RM 1 [facility] None  03/27/2021 11:00 AM [facility] ECHO OP 1 [facility]  03/27/2021 12:00 PM Larey Dresser, MD [facility] None  04/02/2021  7:00 AM [facility] DEVICE REMOTES [facility]  04/08/2021  9:00 AM P[facility] LAB [facility]-PSC None  04/12/2021  2:15 PM [name], [name], NP [facility] None  07/02/2021  7:00 AM [facility] DEVICE REMOTES [facility]     ACTION: Home visit completed

## 2021-01-24 ENCOUNTER — Other Ambulatory Visit: Payer: Self-pay | Admitting: Family

## 2021-01-29 ENCOUNTER — Other Ambulatory Visit: Payer: Self-pay

## 2021-01-29 ENCOUNTER — Ambulatory Visit (INDEPENDENT_AMBULATORY_CARE_PROVIDER_SITE_OTHER): Payer: Medicare Other | Admitting: Family

## 2021-01-29 ENCOUNTER — Encounter: Payer: Self-pay | Admitting: Family

## 2021-01-29 DIAGNOSIS — Z0000 Encounter for general adult medical examination without abnormal findings: Secondary | ICD-10-CM

## 2021-01-29 NOTE — Progress Notes (Signed)
Subjective:   Brent [name] is a 75 y.o. male who presents for Medicare Annual/Subsequent preventive examination.  Review of Systems     Cardiac Risk Factors include: advanced age (>35mn, >>40women);male gender;hypertension;diabetes mellitus;smoking/ tobacco exposure     Objective:    Today's Vitals   01/29/21 1324  PainSc: 0-No pain   There is no height or weight on file to calculate BMI.  Advanced Directives 01/29/2021 11/06/2020 10/31/2020 10/26/2020 10/09/2020 10/09/2020 08/14/2020  Does Patient Have a Medical Advance Directive? Yes Yes No No No No No  Type of [place] will - - - - -  Does patient want to make changes to medical advance directive? No - Patient declined No - Patient declined - - - - No - Patient declined  Copy of [place] Chart? Yes - validated most recent copy scanned in chart (See row information) Yes - validated most recent copy scanned in chart (See row information) - - - - -  Would patient like information on creating a medical advance directive? - - No - Patient declined - Yes (Inpatient - patient requests chaplain consult to create a medical advance directive) - No - Patient declined  Pre-existing out of facility DNR order (yellow form or pink MOST form) - - - - - - -    Current Medications (verified) Outpatient Encounter Medications as of 01/29/2021  Medication Sig   acetaminophen (TYLENOL) 160 MG/5ML liquid Take 10.2 mLs (325 mg total) by mouth every 6 (six) hours as needed for fever or pain.   allopurinol (ZYLOPRIM) 100 MG tablet TAKE 2 TABLETS(200 MG) BY MOUTH DAILY   apixaban (ELIQUIS) 5 MG TABS tablet Take 1 tablet (5 mg total) by mouth 2 (two) times daily.   budesonide (ENTOCORT EC) 3 MG 24 hr capsule Take 3 capsules (9 mg total) by mouth daily.   Calcium 500 MG tablet Take 1 tablet (500 mg total) by mouth 1 day or 1 dose for 1 dose.    Cholecalciferol (VITAMIN D) 125 MCG (5000 UT) CAPS Take 5,000 Units by mouth daily.   dapagliflozin propanediol (FARXIGA) 10 MG TABS tablet Take 1 tablet (10 mg total) by mouth daily before breakfast.   divalproex (DEPAKOTE ER) 500 MG 24 hr tablet TAKE 3 TABLETS(1500 MG) BY MOUTH AT BEDTIME   epoetin alfa (EPOGEN) 10000 UNIT/ML injection 10,000 Units every 30 (thirty) days.   feeding supplement (ENSURE ENLIVE / ENSURE PLUS) LIQD Take 237 mLs by mouth 2 (two) times daily between meals.   ferrous sulfate 325 (65 FE) MG tablet Take 1 tablet (325 mg total) by mouth in the morning and at bedtime.   furosemide (LASIX) 20 MG tablet Take 1 tablet (20 mg total) by mouth daily.   inFLIXimab (REMICADE) 100 MG injection Infuse Remicade IV schedule 1 518mkg every 8 weeks Premedicate with Tylenol 500-65033my mouth and Benadryl 25-31m10m mouth prior to infusion. Last PPD was on 12/2009.    isosorbide mononitrate (IMDUR) 60 MG 24 hr tablet Take 1 tablet (60 mg total) by mouth daily.   ivabradine (CORLANOR) 5 MG TABS tablet TAKE 1 TABLET(5 MG) BY MOUTH TWICE DAILY WITH A MEAL   loperamide (IMODIUM) 2 MG capsule Take 1 capsule (2 mg total) by mouth 3 (three) times daily as needed for diarrhea or loose stools.   magnesium oxide (MAG-OX) 400 MG tablet Take 400 mg by mouth in the morning, at noon, and  at bedtime.   metoprolol succinate (TOPROL-XL) 25 MG 24 hr tablet Take 1.5 tablets (37.5 mg total) by mouth in the morning and at bedtime.   mirtazapine (REMERON) 7.5 MG tablet TAKE 1 TABLET(7.5 MG) BY MOUTH AT BEDTIME   Multiple Vitamin (MULTIVITAMIN WITH MINERALS) TABS tablet Take 1 tablet by mouth daily.   omeprazole (PRILOSEC) 20 MG capsule Take 1 capsule (20 mg total) by mouth daily.   QUEtiapine (SEROQUEL) 100 MG tablet Take 1.5 tablets (150 mg total) by mouth at bedtime.   solifenacin (VESICARE) 5 MG tablet TAKE 1 TABLET(5 MG) BY MOUTH DAILY   spironolactone (ALDACTONE) 25 MG tablet Take 0.5 tablets (12.5 mg  total) by mouth daily.   tamsulosin (FLOMAX) 0.4 MG CAPS capsule Take 1 capsule (0.4 mg total) by mouth daily.   vitamin B-12 (CYANOCOBALAMIN) 500 MCG tablet Take 1,000 mcg by mouth daily.    [DISCONTINUED] polyethylene glycol (MIRALAX / GLYCOLAX) 17 g packet Take 17 g by mouth daily as needed. Give Miralax with 6 oz of water by mouth as needed for constipation (Patient not taking: Reported on 01/23/2021)   No facility-administered encounter medications on file as of 01/29/2021.    Allergies (verified) Azathioprine, Ciprofloxacin, Levaquin [levofloxacin in d5w], and Plendil [felodipine]   History: Past Medical History:  Diagnosis Date   Anemia    Anxiety    Benign paroxysmal positional vertigo    Bipolar I disorder, most recent episode (or current) unspecified    Celiac disease    CHF (congestive heart failure) (HCC)    Chronic kidney disease, stage III (moderate) (HCC)    Crohn's    Remicade q8 weeks   Depression    Essential and other specified forms of tremor    medication-induced Parkinson's, now resolved   Essential hypertension, benign    Gout 2018   Heart failure ([place])    Hypertrophy of prostate without urinary obstruction and other lower urinary tract symptoms (LUTS)    Impotence of organic origin    Insomnia with sleep apnea, unspecified    Iron deficiency anemia, unspecified    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   Neuralgia, neuritis, and radiculitis, unspecified    Other B-complex deficiencies    Other extrapyramidal disease and abnormal movement disorder    Postinflammatory pulmonary fibrosis ([place])    Tobacco use disorder    Vertigo 2018   Past Surgical History:  Procedure Laterality Date   CATARACT EXTRACTION, BILATERAL Bilateral 09/2018   CHOLECYSTECTOMY  07-12-2010   ICD IMPLANT N/A 01/02/2020   Procedure: ICD IMPLANT;  Surgeon: Evans Lance, MD;  Location: [facility];  Service: Cardiovascular;  Laterality: N/A;   RIGHT HEART CATH N/A 10/12/2018   Procedure:  RIGHT HEART CATH;  Surgeon: Jolaine Artist, MD;  Location: [facility];  Service: Cardiovascular;  Laterality: N/A;   RIGHT/LEFT HEART CATH AND CORONARY ANGIOGRAPHY N/A 04/26/2019   Procedure: RIGHT/LEFT HEART CATH AND CORONARY ANGIOGRAPHY;  Surgeon: Larey Dresser, MD;  Location: [facility];  Service: Cardiovascular;  Laterality: N/A;   SMALL INTESTINE SURGERY     x 2   Family History  Problem Relation Age of Onset   Diabetes Mother        maternal grandmother   Uterine cancer Mother    Emphysema Father    Pneumonia Maternal Grandmother    Colon cancer Neg Hx    Social History   Socioeconomic History   Marital status: Divorced    Spouse name: Not on file  Number of children: 1   Years of education: 12   Highest education level: Not on file  Occupational History   Occupation: retired   Occupation: Veteran  Tobacco Use   Smoking status: Former    Packs/day: 1.00    Years: 49.00    Pack years: 49.00    Types: Cigarettes    Start date: 04/11/1956    Quit date: 04/17/2014    Years since quitting: 6.7   Smokeless tobacco: Never  Vaping Use   Vaping Use: Never used  Substance and Sexual Activity   Alcohol use: No    Alcohol/week: 0.0 standard drinks   Drug use: No   Sexual activity: Never  Other Topics Concern   Not on file  Social History Narrative   Not on file   Social Determinants of Health   Financial Resource Strain: Not on file  Food Insecurity: Not on file  Transportation Needs: Not on file  Physical Activity: Not on file  Stress: Not on file  Social Connections: Not on file    Tobacco Counseling Counseling given: Not Answered   Clinical Intake:  Pre-visit preparation completed: No  Pain : No/denies pain Pain Score: 0-No pain     BMI - recorded: 20.48 Nutritional Status: BMI of 19-24  Normal Nutritional Risks: None Diabetes: No  How often do you need to have someone help you when you read instructions, pamphlets, or  other written materials from your doctor or pharmacy?: 3 - Sometimes (Paramedic explains to him once a week) What is the last grade level you completed in school?: 12 grade  Diabetic?Yes   Interpreter Needed?: No  Information entered by :: Dilon Lank,FNP-C   Activities of Daily Living In your present state of health, do you have any difficulty performing the following activities: 01/29/2021 10/09/2020  Hearing? N N  Vision? N N  Difficulty concentrating or making decisions? Y Y  Comment Remembering -  Walking or climbing stairs? N N  Dressing or bathing? N Y  Doing errands, shopping? N N  Preparing Food and eating ? Y -  Comment has an Environmental consultant -  Using the Toilet? N -  In the past six months, have you accidently leaked urine? N -  Do you have problems with loss of bowel control? N -  Managing your Medications? Y -  Comment has paramedic assisting once a week -  Managing your Finances? N -  Housekeeping or managing your Housekeeping? Y -  Comment has an Environmental consultant -  Some recent data might be hidden    Patient Care Team: Quaran Kedzierski, [name], NP as PCP - General (Family Medicine) [place], Peter M, MD as PCP - Cardiology (Cardiology) Larey Dresser, MD as PCP - Advanced Heart Failure (Cardiology) Elmarie Shiley, MD (Nephrology) Milus Banister, MD (Gastroenterology) Jorge Ny, LCSW as Social Worker (Licensed Clinical Social Worker)  Indicate any recent [employer] you may have received from other than [facility] providers in the past year (date may be approximate).     Assessment:   This is a routine wellness examination for Brent [name].  Hearing/Vision screen Hearing Screening - Comments:: No hearing concerns.  Vision Screening - Comments:: No vision concerns. Patient wears prescription glasses. Patient last eye exam was November 2022.  Dietary issues and exercise activities discussed: Current Exercise Habits: Home exercise routine, Type of exercise: walking, Time  (Minutes): 10, Frequency (Times/Week): 7, Weekly Exercise (Minutes/Week): 70, Intensity: Mild, Exercise limited by: respiratory conditions(s);cardiac condition(s) (shortness of breath)  Goals Addressed             This Visit's Progress    Increase water intake   Not on track    Starting 01/03/16, I will attempt to increase my water intake to 8 glasses per day.      LIFESTYLE - ATTEND STRESS MANAGEMENT CLASSES   On track    I will try to keep stress down       Depression Screen PHQ 2/9 Scores 01/29/2021 01/24/2020 12/16/2019 10/20/2019 06/20/2019 05/06/2019 03/25/2019  PHQ - 2 Score 0 0 0 0 0 0 0    Fall Risk Fall Risk  01/29/2021 11/06/2020 07/26/2020 04/26/2020 02/02/2020  Falls in the past year? 0 1 0 0 0  Number falls in past yr: 0 1 0 0 0  Injury with Fall? 0 1 0 0 0  Risk for fall due to : No Fall Risks History of fall(s) - - -  Risk for fall due to: Comment - - - - -  Follow up Falls evaluation completed Falls evaluation completed - - -    FALL RISK PREVENTION PERTAINING TO THE HOME:  Any stairs in or around the home? No  If so, are there any without handrails? No  Home free of loose throw rugs in walkways, pet beds, electrical cords, etc? No  Adequate lighting in your home to reduce risk of falls? Yes   ASSISTIVE DEVICES UTILIZED TO PREVENT FALLS:  Life alert? No  Use of a cane, walker or w/c? Yes  Grab bars in the bathroom? No  Shower chair or bench in shower? Yes  Elevated toilet seat or a handicapped toilet? Yes   TIMED UP AND GO:  Was the test performed? No .  Length of time to ambulate 10 feet: N/A sec.   Gait slow and steady with assistive device  Cognitive Function: MMSE - Mini Mental State Exam 01/13/2018 01/05/2017 01/03/2016  Orientation to time 5 5 5   Orientation to Place 5 5 5   Registration 3 3 3   Attention/ Calculation 3 3 4   Recall 0 0 3  Language- name 2 objects 2 2 2   Language- repeat 1 1 1   Language- follow 3 step command 3 3 2   Language- read  & follow direction 1 1 1   Write a sentence 1 1 1   Copy design 1 1 1   Total score 25 25 28      6CIT Screen 01/29/2021 01/24/2020 01/19/2019  What Year? 0 points 0 points 0 points  What month? 0 points 0 points 0 points  What time? 0 points 0 points 0 points  Count back from 20 4 points 0 points 4 points  Months in reverse 4 points 4 points 4 points  Repeat phrase 10 points 8 points 4 points  Total Score 18 12 12     Immunizations Immunization History  Administered Date(s) Administered   Fluad Quad(high Dose 65+) 11/15/2018, 11/17/2019, 11/19/2020   H1N1 02/07/2008   Influenza, High Dose Seasonal PF 11/26/2016, 12/15/2017   Influenza, Seasonal, Injecte, Preservative Fre 12/12/2008, 11/07/2009, 11/04/2010   Influenza,inj,Quad PF,6+ Mos 11/13/2014, 01/03/2016   Influenza-Unspecified 01/11/1997, 12/15/1997, 03/02/2000, 01/02/2003, 12/18/2003, 12/30/2004, 12/10/2005, 12/25/2005, 01/11/2007, 02/07/2008, 12/08/2011, 10/25/2012, 12/16/2012, 12/09/2013, 11/28/2014, 12/31/2015, 11/24/2016, 10/29/2017, 11/24/2017, 12/15/2017, 11/25/2018   Moderna Covid-19 Vaccine Bivalent Booster 78yr & up 11/19/2020   Moderna Sars-Covid-2 Vaccination 04/20/2019, 05/18/2019, 10/25/2019   PPD Test 12/30/2010, 01/05/2012, 01/07/2013, 01/13/2014   Pneumococcal Conjugate-13 01/03/2014, 08/10/2014   Pneumococcal Polysaccharide-23 03/02/2000, 03/05/2004, 03/27/2005, 01/09/2015, 01/03/2016, 05/06/2018  Pneumococcal-Unspecified 12/25/2005   Tdap 05/12/2011, 05/26/2011   Zoster Recombinat (Shingrix) 05/13/2018, 04/01/2019    TDAP status: Up to date  Flu Vaccine status: Up to date  Pneumococcal vaccine status: Up to date  Covid-19 vaccine status: Completed vaccines  Qualifies for Shingles Vaccine? Yes   Zostavax completed Yes   Shingrix Completed?: Yes  Screening Tests Health Maintenance  Topic Date Due   URINE MICROALBUMIN  Never done   HEMOGLOBIN A1C  02/27/2020   FOOT EXAM  08/09/2020   TETANUS/TDAP   05/25/2021   OPHTHALMOLOGY EXAM  06/28/2021   COLONOSCOPY (Pts 45-28yr Insurance coverage will need to be confirmed)  09/17/2027   Pneumonia Vaccine 75 Years old  Completed   INFLUENZA VACCINE  Completed   COVID-19 Vaccine  Completed   Hepatitis C Screening  Completed   Zoster Vaccines- Shingrix  Completed   HPV VACCINES  Aged Out    Health Maintenance  Health Maintenance Due  Topic Date Due   URINE MICROALBUMIN  Never done   HEMOGLOBIN A1C  02/27/2020   FOOT EXAM  08/09/2020    Colorectal cancer screening: Type of screening: Colonoscopy. Completed 09/16/2017. Repeat every 10 years  Lung Cancer Screening: (Low Dose CT Chest recommended if Age 75-80years, 30 pack-year currently smoking OR have quit w/in 15years.) does qualify.   Lung Cancer Screening Referral: No had chest CT scan done 02/2020 Yes   Additional Screening:  Hepatitis C Screening: does qualify; Completed yes  Vision Screening: Recommended annual ophthalmology exams for early detection of glaucoma and other disorders of the eye. Is the patient up to date with their annual eye exam?  Yes  Who is the provider or what is the name of the office in which the patient attends annual eye exams? Dr.Shapiro  If pt is not established with a provider, would they like to be referred to a provider to establish care? No .   Dental Screening: Recommended annual dental exams for proper oral hygiene  Community Resource Referral / Chronic Care Management: CRR required this visit?  No   CCM required this visit?  No      Plan:     I have personally reviewed and noted the following in the patient's chart:   Medical and social history Use of alcohol, tobacco or illicit drugs  Current medications and supplements including opioid prescriptions. Patient is not currently taking opioid prescriptions. Functional ability and status Nutritional status Physical activity Advanced directives List of other  physicians Hospitalizations, surgeries, and ER visits in previous 12 months Vitals Screenings to include cognitive, depression, and falls Referrals and appointments  In addition, I have reviewed and discussed with patient certain preventive protocols, quality metrics, and best practice recommendations. A written personalized care plan for preventive services as well as general preventive health recommendations were provided to patient.     DSandrea Hughs NP   01/29/2021   Nurse Notes: Had CT scan 02/2020

## 2021-01-29 NOTE — Progress Notes (Signed)
  This service is provided via telemedicine  No vital signs collected/recorded due to the encounter was a telemedicine visit.   Location of patient (ex: home, work):  Home.  Patient consents to a telephone visit:  Yes  Location of the provider (ex: office, home):  [employer].  Name of any referring provider:  [name], [name], NP   Names of all persons participating in the telemedicine service and their role in the encounter:  Patient, Heriberto [place], [place], [place], Webb Silversmith, NP.    Time spent on call:  8 minutes spent on the phone with Medical Assistant.

## 2021-01-29 NOTE — Patient Instructions (Signed)
[name] , Thank you for taking time to come for your Medicare Wellness Visit. I appreciate your ongoing commitment to your health goals. Please review the following plan we discussed and let me know if I can assist you in the future.   Screening recommendations/referrals: Colonoscopy: Up to date  Recommended yearly ophthalmology/optometry visit for glaucoma screening and checkup Recommended yearly dental visit for hygiene and checkup  Vaccinations: Influenza vaccine : Up to date  Pneumococcal vaccine Up to date  Tdap vaccine up to date  Shingles vaccine Up to date     Advanced directives: yes   Conditions/risks identified: Advance age male > 14 yrs,male gender,Hypertension,Type 2 DM and Hx of smoking   Next appointment: 1 year   Preventive Care 43 Years and Older, Male Preventive care refers to lifestyle choices and visits with your health care provider that can promote health and wellness. What does preventive care include? A yearly physical exam. This is also called an annual well check. Dental exams once or twice a year. Routine eye exams. Ask your health care provider how often you should have your eyes checked. Personal lifestyle choices, including: Daily care of your teeth and gums. Regular physical activity. Eating a healthy diet. Avoiding tobacco and drug use. Limiting alcohol use. Practicing safe sex. Taking low doses of aspirin every day. Taking vitamin and mineral supplements as recommended by your health care provider. What happens during an annual well check? The services and screenings done by your health care provider during your annual well check will depend on your age, overall health, lifestyle risk factors, and family history of disease. Counseling  Your health care provider may ask you questions about your: Alcohol use. Tobacco use. Drug use. Emotional well-being. Home and relationship well-being. Sexual activity. Eating habits. History of  falls. Memory and ability to understand (cognition). Work and work Statistician. Screening  You may have the following tests or measurements: Height, weight, and BMI. Blood pressure. Lipid and cholesterol levels. These may be checked every 5 years, or more frequently if you are over 35 years old. Skin check. Lung cancer screening. You may have this screening every year starting at age 50 if you have a 30-pack-year history of smoking and currently smoke or have quit within the past 15 years. Fecal occult blood test (FOBT) of the stool. You may have this test every year starting at age 53. Flexible sigmoidoscopy or colonoscopy. You may have a sigmoidoscopy every 5 years or a colonoscopy every 10 years starting at age 67. Prostate cancer screening. Recommendations will vary depending on your family history and other risks. Hepatitis C blood test. Hepatitis B blood test. Sexually transmitted disease (STD) testing. Diabetes screening. This is done by checking your blood sugar (glucose) after you have not eaten for a while (fasting). You may have this done every 1-3 years. Abdominal aortic aneurysm (AAA) screening. You may need this if you are a current or former smoker. Osteoporosis. You may be screened starting at age 26 if you are at high risk. Talk with your health care provider about your test results, treatment options, and if necessary, the need for more tests. Vaccines  Your health care provider may recommend certain vaccines, such as: Influenza vaccine. This is recommended every year. Tetanus, diphtheria, and acellular pertussis (Tdap, Td) vaccine. You may need a Td booster every 10 years. Zoster vaccine. You may need this after age 53. Pneumococcal 13-valent conjugate (PCV13) vaccine. One dose is recommended after age 66. Pneumococcal polysaccharide (PPSV23)  vaccine. One dose is recommended after age 48. Talk to your health care provider about which screenings and vaccines you need and  how often you need them. This information is not intended to replace advice given to you by your health care provider. Make sure you discuss any questions you have with your health care provider. Document Released: 03/09/2015 Document Revised: 10/31/2015 Document Reviewed: 12/12/2014 Elsevier Interactive Patient Education  2017 [place] Prevention in the Home Falls can cause injuries. They can happen to people of all ages. There are many things you can do to make your home safe and to help prevent falls. What can I do on the outside of my home? Regularly fix the edges of walkways and driveways and fix any cracks. Remove anything that might make you trip as you walk through a door, such as a raised step or threshold. Trim any bushes or trees on the path to your home. Use bright outdoor lighting. Clear any walking paths of anything that might make someone trip, such as rocks or tools. Regularly check to see if handrails are loose or broken. Make sure that both sides of any steps have handrails. Any raised decks and porches should have guardrails on the edges. Have any leaves, snow, or ice cleared regularly. Use sand or salt on walking paths during winter. Clean up any spills in your garage right away. This includes oil or grease spills. What can I do in the bathroom? Use night lights. Install grab bars by the toilet and in the tub and shower. Do not use towel bars as grab bars. Use non-skid mats or decals in the tub or shower. If you need to sit down in the shower, use a plastic, non-slip stool. Keep the floor dry. Clean up any water that spills on the floor as soon as it happens. Remove soap buildup in the tub or shower regularly. Attach bath mats securely with double-sided non-slip rug tape. Do not have throw rugs and other things on the floor that can make you trip. What can I do in the bedroom? Use night lights. Make sure that you have a light by your bed that is easy to  reach. Do not use any sheets or blankets that are too big for your bed. They should not hang down onto the floor. Have a firm chair that has side arms. You can use this for support while you get dressed. Do not have throw rugs and other things on the floor that can make you trip. What can I do in the kitchen? Clean up any spills right away. Avoid walking on wet floors. Keep items that you use a lot in easy-to-reach places. If you need to reach something above you, use a strong step stool that has a grab bar. Keep electrical cords out of the way. Do not use floor polish or wax that makes floors slippery. If you must use wax, use non-skid floor wax. Do not have throw rugs and other things on the floor that can make you trip. What can I do with my stairs? Do not leave any items on the stairs. Make sure that there are handrails on both sides of the stairs and use them. Fix handrails that are broken or loose. Make sure that handrails are as long as the stairways. Check any carpeting to make sure that it is firmly attached to the stairs. Fix any carpet that is loose or worn. Avoid having throw rugs at the top or bottom  of the stairs. If you do have throw rugs, attach them to the floor with carpet tape. Make sure that you have a light switch at the top of the stairs and the bottom of the stairs. If you do not have them, ask someone to add them for you. What else can I do to help prevent falls? Wear shoes that: Do not have high heels. Have rubber bottoms. Are comfortable and fit you well. Are closed at the toe. Do not wear sandals. If you use a stepladder: Make sure that it is fully opened. Do not climb a closed stepladder. Make sure that both sides of the stepladder are locked into place. Ask someone to hold it for you, if possible. Clearly mark and make sure that you can see: Any grab bars or handrails. First and last steps. Where the edge of each step is. Use tools that help you move  around (mobility aids) if they are needed. These include: Canes. Walkers. Scooters. Crutches. Turn on the lights when you go into a dark area. Replace any light bulbs as soon as they burn out. Set up your furniture so you have a clear path. Avoid moving your furniture around. If any of your floors are uneven, fix them. If there are any pets around you, be aware of where they are. Review your medicines with your doctor. Some medicines can make you feel dizzy. This can increase your chance of falling. Ask your doctor what other things that you can do to help prevent falls. This information is not intended to replace advice given to you by your health care provider. Make sure you discuss any questions you have with your health care provider. Document Released: 12/07/2008 Document Revised: 07/19/2015 Document Reviewed: 03/17/2014 Elsevier Interactive Patient Education  2017 [employer].

## 2021-01-30 ENCOUNTER — Other Ambulatory Visit (HOSPITAL_COMMUNITY): Payer: Self-pay

## 2021-01-30 NOTE — Progress Notes (Signed)
Paramedicine Encounter    Patient ID: Brent [name], male    DOB: Feb 03, 1946, 75 y.o.   MRN: [id number]  Arrived for MED [facility] for [name]. He was med complaint over the last week. He was seen by PCP yesterday. He reports feeling good with no complaints. I reviewed medications and filled pill box accordingly. We reviewed appointments and confirmed same. Home visit complete I will be back out in one week.    Refills: -Seroquel -Corlanor   ACTION: Home visit completed

## 2021-01-31 ENCOUNTER — Other Ambulatory Visit: Payer: Self-pay | Admitting: Family

## 2021-01-31 DIAGNOSIS — F319 Bipolar disorder, unspecified: Secondary | ICD-10-CM

## 2021-02-02 ENCOUNTER — Other Ambulatory Visit: Payer: Self-pay | Admitting: Nurse Practitioner

## 2021-02-02 DIAGNOSIS — R630 Anorexia: Secondary | ICD-10-CM

## 2021-02-04 ENCOUNTER — Ambulatory Visit (INDEPENDENT_AMBULATORY_CARE_PROVIDER_SITE_OTHER): Payer: Medicare Other

## 2021-02-04 DIAGNOSIS — I5022 Chronic systolic (congestive) heart failure: Secondary | ICD-10-CM | POA: Diagnosis not present

## 2021-02-04 DIAGNOSIS — Z95810 Presence of automatic (implantable) cardiac defibrillator: Secondary | ICD-10-CM | POA: Diagnosis not present

## 2021-02-04 NOTE — Progress Notes (Signed)
EPIC Encounter for ICM Monitoring  Patient Name: Brent [name] is a 75 y.o. male Date: 02/04/2021 Primary Care Physican: Sandrea Hughs, NP Primary Cardiologist: [name] Electrophysiologist: Lovena Le 02/04/2021 Weight: 170.6 lbs        1st ICM Remote Transmission.  Heart Failure questions reviewed.  Pt asymptomatic for fluid symptoms.  Discussed diet and limiting salt   Optivol thoracic impedance normal but was suggesting possible fluid accumulation from 11/19 - 12/11.   Prescribed:  Furosemide 20 mg take 1 tablet (20 mg total) by mouth daily. Spironolactone 25 mg take 0.5 tablet (12.5 mg total) daily  Recommendations: Reinforced limiting salt intake to < 2000 mg daily and fluid intake to 64 oz daily.  Encouraged to call if experiencing fluid symptoms.  Follow-up plan: ICM clinic phone appointment on 03/11/2021.   91 day device clinic remote transmission 04/02/2021.    EP/Cardiology Office Visits: 03/27/2021 with Dr. [name].    Copy of ICM check sent to Dr. Lovena Le.   3 month ICM trend: 02/04/2021.    12-14 Month ICM trend:       Rosalene Billings, RN 02/04/2021 12:36 PM

## 2021-02-04 NOTE — Telephone Encounter (Signed)
Patient has request refill on medication "Mirtazapine 7.103m". Patient last refill was 01/09/2021. Patient was given 30 day supply. Im unsure if this medication was for patient to try out. Medication pend and sent to PCP [name], DNelda Bucks NP for approval. Please Advise.

## 2021-02-06 ENCOUNTER — Other Ambulatory Visit (HOSPITAL_COMMUNITY): Payer: Self-pay

## 2021-02-06 NOTE — Progress Notes (Signed)
Paramedicine Encounter    Patient ID: Brent [name], male    DOB: September 12, 1945, 75 y.o.   MRN: [id number]  Arrived for home visit for Brent [name] who reports feeling good with no complaints of shortness of breath, chest pain or dizziness. Meds were reviewed and Brent [name] was compliant with meds over the last week. Vitals and assessment completed. No swelling noted. Lungs clear.  I filled one week of medications for Brent [name]. I also gave him his morning dose of medications.   -Corlanor short Sat-Weds AM. [facility] reporting med is >400$, I will send message to HF clinic staff for patient assistance application.   Appointments reviewed. I will see Brent [name] in one week. Home visit complete.    Refills: Corlanor (pending pt assistance)     Patient Care Team: [name], [name], NP as PCP - General (Family Medicine) [place], Peter M, MD as PCP - Cardiology (Cardiology) Larey Dresser, MD as PCP - Advanced Heart Failure (Cardiology) Elmarie Shiley, MD (Nephrology) Milus Banister, MD (Gastroenterology) Jorge Ny, LCSW as Social Worker (Licensed Clinical Social Worker)  Patient Active Problem List   Diagnosis Date Noted   SIRS (systemic inflammatory response syndrome) ([place]) 10/31/2020   Neurocognitive deficits 10/17/2020   Hypokalemia 10/08/2020   Hypomagnesemia 10/08/2020   CKD (chronic kidney disease), stage III ([place]) 10/08/2020   COVID-19 virus infection 10/08/2020   Electrolyte abnormality 05/17/2020   ICD (implantable cardioverter-defibrillator) in place 19/37/9024   Chronic systolic heart failure ([place]) 01/02/2020   Decreased appetite 10/08/2019   Weakness 10/08/2019   Hyperkalemia 09/73/5329   Chronic systolic CHF (congestive heart failure) ([place]) 08/26/2019   Type 2 diabetes mellitus with stage 3 chronic kidney disease ([place]) 08/26/2019   Chronic obstructive pulmonary disease ([place]) 06/20/2019   Ventricular tachycardia 06/20/2019   Acute on chronic systolic (congestive) heart failure  ([place]) 92/42/6834   Metabolic acidosis 19/62/2297   Orthostasis 09/07/2018   AKI (acute kidney injury) ([place])    Immunosuppressed status ([place])    Macrocytic anemia    Chronic combined systolic and diastolic congestive heart failure ([place]) 06/03/2018   Candida esophagitis ([place]) 09/22/2017   History of smoking 30 or more pack years 01/05/2017   Bipolar 1 disorder ([place])    BPH (benign prostatic hyperplasia) 03/31/2013   Anemia in chronic kidney disease 03/31/2013   Essential hypertension, benign 03/31/2013   Acute renal failure superimposed on stage 3 chronic kidney disease ([place]) 06/04/2012   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[place]) 01/23/2010    Current Outpatient Medications:    acetaminophen (TYLENOL) 160 MG/5ML liquid, Take 10.2 mLs (325 mg total) by mouth every 6 (six) hours as needed for fever or pain., Disp: , Rfl:    allopurinol (ZYLOPRIM) 100 MG tablet, TAKE 2 TABLETS(200 MG) BY MOUTH DAILY, Disp: 180 tablet, Rfl: 1   apixaban (ELIQUIS) 5 MG TABS tablet, Take 1 tablet (5 mg total) by mouth 2 (two) times daily., Disp: 60 tablet, Rfl: 0   budesonide (ENTOCORT EC) 3 MG 24 hr capsule, Take 3 capsules (9 mg total) by mouth daily., Disp: 30 capsule, Rfl: 0   Calcium 500 MG tablet, Take 1 tablet (500 mg total) by mouth 1 day or 1 dose for 1 dose., Disp: 30 tablet, Rfl:    Cholecalciferol (VITAMIN D) 125 MCG (5000 UT) CAPS, Take 5,000 Units by mouth daily., Disp: 30 capsule, Rfl: 0   dapagliflozin propanediol (FARXIGA) 10 MG TABS tablet, Take 1 tablet (10 mg total) by mouth daily before breakfast., Disp: 30 tablet,  Rfl: 0   divalproex (DEPAKOTE ER) 500 MG 24 hr tablet, TAKE 3 TABLETS(1500 MG) BY MOUTH AT BEDTIME, Disp: 270 tablet, Rfl: 1   epoetin alfa (EPOGEN) 10000 UNIT/ML injection, 10,000 Units every 30 (thirty) days., Disp: , Rfl:    feeding supplement (ENSURE ENLIVE / ENSURE PLUS) LIQD, Take 237 mLs by mouth 2 (two) times daily between meals., Disp: , Rfl:    ferrous sulfate 325 (65 FE) MG  tablet, Take 1 tablet (325 mg total) by mouth in the morning and at bedtime., Disp: 30 tablet, Rfl: 0   furosemide (LASIX) 20 MG tablet, Take 1 tablet (20 mg total) by mouth daily., Disp: 30 tablet, Rfl: 0   inFLIXimab (REMICADE) 100 MG injection, Infuse Remicade IV schedule 1 44m/kg every 8 weeks Premedicate with Tylenol 500-6583mby mouth and Benadryl 25-5043my mouth prior to infusion. Last PPD was on 12/2009. , Disp: 1 each, Rfl: 6   isosorbide mononitrate (IMDUR) 60 MG 24 hr tablet, Take 1 tablet (60 mg total) by mouth daily., Disp: 30 tablet, Rfl: 0   ivabradine (CORLANOR) 5 MG TABS tablet, TAKE 1 TABLET(5 MG) BY MOUTH TWICE DAILY WITH A MEAL, Disp: 60 tablet, Rfl: 0   loperamide (IMODIUM) 2 MG capsule, Take 1 capsule (2 mg total) by mouth 3 (three) times daily as needed for diarrhea or loose stools., Disp: 30 capsule, Rfl: 0   magnesium oxide (MAG-OX) 400 MG tablet, Take 400 mg by mouth in the morning, at noon, and at bedtime., Disp: , Rfl:    metoprolol succinate (TOPROL-XL) 25 MG 24 hr tablet, Take 1.5 tablets (37.5 mg total) by mouth in the morning and at bedtime., Disp: 90 tablet, Rfl: 6   mirtazapine (REMERON) 7.5 MG tablet, TAKE 1 TABLET(7.5 MG) BY MOUTH AT BEDTIME, Disp: 30 tablet, Rfl: 3   Multiple Vitamin (MULTIVITAMIN WITH MINERALS) TABS tablet, Take 1 tablet by mouth daily., Disp: , Rfl:    omeprazole (PRILOSEC) 20 MG capsule, Take 1 capsule (20 mg total) by mouth daily., Disp: 30 capsule, Rfl: 0   QUEtiapine (SEROQUEL) 100 MG tablet, Take 1.5 tablets (150 mg total) by mouth at bedtime., Disp: 45 tablet, Rfl: 5   solifenacin (VESICARE) 5 MG tablet, TAKE 1 TABLET(5 MG) BY MOUTH DAILY, Disp: 90 tablet, Rfl: 1   spironolactone (ALDACTONE) 25 MG tablet, Take 0.5 tablets (12.5 mg total) by mouth daily., Disp: 45 tablet, Rfl: 3   tamsulosin (FLOMAX) 0.4 MG CAPS capsule, Take 1 capsule (0.4 mg total) by mouth daily., Disp: 30 capsule, Rfl: 0   vitamin B-12 (CYANOCOBALAMIN) 500 MCG tablet,  Take 1,000 mcg by mouth daily. 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   Smoking status: Former    Packs/day: 1.00    Years: 49.00    Pack years: 49.00    Types: Cigarettes  Start date: 04/11/1956    Quit date: 04/17/2014    Years since quitting: 6.8   Smokeless tobacco: Never  Vaping Use   Vaping Use: Never used  Substance and Sexual Activity   Alcohol use: No    Alcohol/week: 0.0 standard drinks   Drug use: No   Sexual activity: Never  Other Topics Concern   Not on file  Social History Narrative   Not on file   Social Determinants of Health   Financial Resource Strain: Not on file  Food Insecurity: Not on file  Transportation Needs: Not on file  Physical Activity: Not on file  Stress: Not on file  Social Connections: Not on file  Intimate Partner Violence: Not on file    Physical Exam Vitals reviewed.  Constitutional:      Appearance: Normal appearance. He is normal weight.  HENT:     Head: Normocephalic.     Nose: Nose normal.     Mouth/Throat:     Mouth: Mucous membranes are moist.     Pharynx: Oropharynx is clear.  Eyes:     Conjunctiva/sclera: Conjunctivae normal.     Pupils: Pupils are equal, round, and reactive to light.  Cardiovascular:     Rate and Rhythm: Normal rate and regular rhythm.     Pulses: Normal pulses.     Heart sounds: Normal heart sounds.  Pulmonary:     Effort: Pulmonary  effort is normal.     Breath sounds: Normal breath sounds.  Abdominal:     General: Abdomen is flat.     Palpations: Abdomen is soft.  Musculoskeletal:        General: No swelling. Normal range of motion.     Cervical back: Normal range of motion.     Right lower leg: No edema.     Left lower leg: No edema.  Skin:    General: Skin is warm and dry.     Capillary Refill: Capillary refill takes less than 2 seconds.  Neurological:     General: No focal deficit present.     Mental Status: He is alert. Mental status is at baseline.  Psychiatric:        Mood and Affect: Mood normal.     Future Appointments  Date Time Provider [place]  02/20/2021 11:00 AM [facility] RM 1 [facility] None  03/11/2021  7:10 AM [facility] DEVICE REMOTES [facility]  03/27/2021 11:00 AM [facility] ECHO OP 1 [facility]  03/27/2021 12:00 PM Larey Dresser, MD [facility] None  04/02/2021  7:00 AM [facility] DEVICE REMOTES [facility]  04/08/2021  9:00 AM [facility] LAB [facility] None  04/12/2021  2:15 PM [name], [name], NP [facility] None  07/02/2021  7:00 AM [facility] DEVICE REMOTES [facility]  01/31/2022  3:45 PM [name], [name], NP [facility] None     ACTION: Home visit completed

## 2021-02-07 ENCOUNTER — Other Ambulatory Visit (HOSPITAL_COMMUNITY): Payer: Self-pay | Admitting: *Deleted

## 2021-02-07 ENCOUNTER — Other Ambulatory Visit (HOSPITAL_COMMUNITY): Payer: Self-pay

## 2021-02-07 DIAGNOSIS — I10 Essential (primary) hypertension: Secondary | ICD-10-CM

## 2021-02-07 MED ORDER — IVABRADINE HCL 5 MG PO TABS: ORAL_TABLET | ORAL | 6 refills | Status: DC

## 2021-02-13 ENCOUNTER — Other Ambulatory Visit (HOSPITAL_COMMUNITY): Payer: Self-pay

## 2021-02-13 NOTE — Progress Notes (Signed)
Paramedicine Encounter    Patient ID: Brent [name], male    DOB: 01/29/1946, 75 y.o.   MRN: [id number]  Arrived for home visit where [name] was seated in his living room alert and oriented. [name] reports feeling okay but says he is a little down and depressed this week. I sat and talked with him for some of our visit encouraging him. He felt better after this.   He denied shortness of breath, dizziness, chest pain or trouble taking medications. He was compliant with all meds over the last week.   Assessment, vitals as noted. I reviewed meds and confirmed same filling pill box accordingly.  -No corlanor due to waiting on [facility] shipment.   I set out medications for today as he has not yet taken them.  We reviewed appointments. Home visit complete. I will see Brent [name] in one week.   Refills: Ferrous Sulfate      Patient Care Team: [name], [name], NP as PCP - General (Family Medicine) [place], Peter M, MD as PCP - Cardiology (Cardiology) Larey Dresser, MD as PCP - Advanced Heart Failure (Cardiology) Elmarie Shiley, MD (Nephrology) Milus Banister, MD (Gastroenterology) Jorge Ny, LCSW as Social Worker (Licensed Clinical Social Worker)  Patient Active Problem List   Diagnosis Date Noted   SIRS (systemic inflammatory response syndrome) ([place]) 10/31/2020   Neurocognitive deficits 10/17/2020   Hypokalemia 10/08/2020   Hypomagnesemia 10/08/2020   CKD (chronic kidney disease), stage III ([place]) 10/08/2020   COVID-19 virus infection 10/08/2020   Electrolyte abnormality 05/17/2020   ICD (implantable cardioverter-defibrillator) in place 79/43/2761   Chronic systolic heart failure ([place]) 01/02/2020   Decreased appetite 10/08/2019   Weakness 10/08/2019   Hyperkalemia 47/10/2955   Chronic systolic CHF (congestive heart failure) ([place]) 08/26/2019   Type 2 diabetes mellitus with stage 3 chronic kidney disease ([place]) 08/26/2019   Chronic obstructive pulmonary disease ([place]) 06/20/2019    Ventricular tachycardia 06/20/2019   Acute on chronic systolic (congestive) heart failure ([place]) 47/34/0370   Metabolic acidosis 96/43/8381   Orthostasis 09/07/2018   AKI (acute kidney injury) ([place])    Immunosuppressed status ([place])    Macrocytic anemia    Chronic combined systolic and diastolic congestive heart failure ([place]) 06/03/2018   Candida esophagitis ([place]) 09/22/2017   History of smoking 30 or more pack years 01/05/2017   Bipolar 1 disorder ([place])    BPH (benign prostatic hyperplasia) 03/31/2013   Anemia in chronic kidney disease 03/31/2013   Essential hypertension, benign 03/31/2013   Acute renal failure superimposed on stage 3 chronic kidney disease ([place]) 06/04/2012   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[place]) 01/23/2010    Current Outpatient Medications:    acetaminophen (TYLENOL) 160 MG/5ML liquid, Take 10.2 mLs (325 mg total) by mouth every 6 (six) hours as needed for fever or pain., Disp: , Rfl:    allopurinol (ZYLOPRIM) 100 MG tablet, TAKE 2 TABLETS(200 MG) BY MOUTH DAILY, Disp: 180 tablet, Rfl: 1   apixaban (ELIQUIS) 5 MG TABS tablet, Take 1 tablet (5 mg total) by mouth 2 (two) times daily., Disp: 60 tablet, Rfl: 0   budesonide (ENTOCORT EC) 3 MG 24 hr capsule, Take 3 capsules (9 mg total) by mouth daily., Disp: 30 capsule, Rfl: 0   Calcium 500 MG tablet, Take 1 tablet (500 mg total) by mouth 1 day or 1 dose for 1 dose., Disp: 30 tablet, Rfl:    Cholecalciferol (VITAMIN D) 125 MCG (5000 UT) CAPS, Take 5,000 Units by mouth daily., Disp: 30  capsule, Rfl: 0   dapagliflozin propanediol (FARXIGA) 10 MG TABS tablet, Take 1 tablet (10 mg total) by mouth daily before breakfast., Disp: 30 tablet, Rfl: 0   divalproex (DEPAKOTE ER) 500 MG 24 hr tablet, TAKE 3 TABLETS(1500 MG) BY MOUTH AT BEDTIME, Disp: 270 tablet, Rfl: 1   epoetin alfa (EPOGEN) 10000 UNIT/ML injection, 10,000 Units every 30 (thirty) days., Disp: , Rfl:    feeding supplement (ENSURE ENLIVE / ENSURE PLUS) LIQD, Take 237 mLs by  mouth 2 (two) times daily between meals., Disp: , Rfl:    ferrous sulfate 325 (65 FE) MG tablet, Take 1 tablet (325 mg total) by mouth in the morning and at bedtime., Disp: 30 tablet, Rfl: 0   furosemide (LASIX) 20 MG tablet, Take 1 tablet (20 mg total) by mouth daily., Disp: 30 tablet, Rfl: 0   inFLIXimab (REMICADE) 100 MG injection, Infuse Remicade IV schedule 1 79m/kg every 8 weeks Premedicate with Tylenol 500-6548mby mouth and Benadryl 25-5028my mouth prior to infusion. Last PPD was on 12/2009. , Disp: 1 each, Rfl: 6   isosorbide mononitrate (IMDUR) 60 MG 24 hr tablet, Take 1 tablet (60 mg total) by mouth daily., Disp: 30 tablet, Rfl: 0   ivabradine (CORLANOR) 5 MG TABS tablet, TAKE 1 TABLET(5 MG) BY MOUTH TWICE DAILY WITH A MEAL, Disp: 60 tablet, Rfl: 6   loperamide (IMODIUM) 2 MG capsule, Take 1 capsule (2 mg total) by mouth 3 (three) times daily as needed for diarrhea or loose stools., Disp: 30 capsule, Rfl: 0   magnesium oxide (MAG-OX) 400 MG tablet, Take 400 mg by mouth in the morning, at noon, and at bedtime., Disp: , Rfl:    metoprolol succinate (TOPROL-XL) 25 MG 24 hr tablet, Take 1.5 tablets (37.5 mg total) by mouth in the morning and at bedtime., Disp: 90 tablet, Rfl: 6   mirtazapine (REMERON) 7.5 MG tablet, TAKE 1 TABLET(7.5 MG) BY MOUTH AT BEDTIME, Disp: 30 tablet, Rfl: 3   Multiple Vitamin (MULTIVITAMIN WITH MINERALS) TABS tablet, Take 1 tablet by mouth daily., Disp: , Rfl:    omeprazole (PRILOSEC) 20 MG capsule, Take 1 capsule (20 mg total) by mouth daily., Disp: 30 capsule, Rfl: 0   QUEtiapine (SEROQUEL) 100 MG tablet, Take 1.5 tablets (150 mg total) by mouth at bedtime., Disp: 45 tablet, Rfl: 5   solifenacin (VESICARE) 5 MG tablet, TAKE 1 TABLET(5 MG) BY MOUTH DAILY, Disp: 90 tablet, Rfl: 1   spironolactone (ALDACTONE) 25 MG tablet, Take 0.5 tablets (12.5 mg total) by mouth daily., Disp: 45 tablet, Rfl: 3   tamsulosin (FLOMAX) 0.4 MG CAPS capsule, Take 1 capsule (0.4 mg total) by  mouth daily., Disp: 30 capsule, Rfl: 0   vitamin B-12 (CYANOCOBALAMIN) 500 MCG tablet, Take 1,000 mcg by mouth daily. 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  Smoking status: Former    Packs/day: 1.00    Years: 49.00    Pack years: 49.00    Types: Cigarettes    Start date: 04/11/1956    Quit date: 04/17/2014    Years since quitting: 6.8   Smokeless tobacco: Never  Vaping Use   Vaping Use: Never used  Substance and Sexual Activity   Alcohol use: No    Alcohol/week: 0.0 standard drinks   Drug use: No   Sexual activity: Never  Other Topics Concern   Not on file  Social History Narrative   Not on file   Social Determinants of Health   Financial Resource Strain: Not on file  Food Insecurity: Not on file  Transportation Needs: Not on file  Physical Activity: Not on file  Stress: Not on file  Social Connections: Not on file  Intimate Partner Violence: Not on file    Physical Exam Vitals reviewed.  Constitutional:      Appearance: Normal appearance. He is normal weight.  HENT:     Head: Normocephalic.     Mouth/Throat:     Mouth: Mucous membranes are moist.     Pharynx: Oropharynx is clear.  Eyes:     Conjunctiva/sclera: Conjunctivae normal.     Pupils: Pupils are equal, round, and reactive to light.  Cardiovascular:     Rate and Rhythm: Normal rate and regular rhythm.     Pulses: Normal pulses.     Heart  sounds: Normal heart sounds.  Pulmonary:     Effort: Pulmonary effort is normal.     Breath sounds: Normal breath sounds.  Abdominal:     General: Abdomen is flat.     Palpations: Abdomen is soft.  Musculoskeletal:        General: No swelling. Normal range of motion.     Cervical back: Normal range of motion.     Right lower leg: No edema.     Left lower leg: No edema.  Skin:    General: Skin is warm and dry.     Capillary Refill: Capillary refill takes less than 2 seconds.  Neurological:     General: No focal deficit present.     Mental Status: He is alert. Mental status is at baseline.  Psychiatric:        Mood and Affect: Mood normal.        Future Appointments  Date Time Provider [place]  02/20/2021 11:00 AM [facility] RM 1 [facility] None  03/11/2021  7:10 AM [facility] DEVICE REMOTES [facility]  03/27/2021 11:00 AM [facility] ECHO OP 1 [facility]  03/27/2021 12:00 PM Larey Dresser, MD [facility] None  04/02/2021  7:00 AM [facility] DEVICE REMOTES [facility]  04/08/2021  9:00 AM [facility] LAB [facility] None  04/12/2021  2:15 PM [name], [name], NP [facility] None  07/02/2021  7:00 AM [facility] DEVICE REMOTES [facility]  01/31/2022  3:45 PM [name], [name], NP [facility] None     ACTION: Home visit completed

## 2021-02-20 ENCOUNTER — Non-Acute Institutional Stay (HOSPITAL_COMMUNITY)
Admission: RE | Admit: 2021-02-20 | Discharge: 2021-02-20 | Disposition: A | Discharge status: Home / Self Care | Payer: No Typology Code available for payment source | Source: Ambulatory Visit | Attending: Internal Medicine | Admitting: Internal Medicine

## 2021-02-20 ENCOUNTER — Other Ambulatory Visit (HOSPITAL_COMMUNITY): Payer: Self-pay | Admitting: Cardiology

## 2021-02-20 ENCOUNTER — Other Ambulatory Visit: Payer: Self-pay

## 2021-02-20 ENCOUNTER — Other Ambulatory Visit (HOSPITAL_COMMUNITY): Payer: Self-pay

## 2021-02-20 DIAGNOSIS — N189 Chronic kidney disease, unspecified: Secondary | ICD-10-CM | POA: Insufficient documentation

## 2021-02-20 DIAGNOSIS — D631 Anemia in chronic kidney disease: Secondary | ICD-10-CM | POA: Insufficient documentation

## 2021-02-20 DIAGNOSIS — I10 Essential (primary) hypertension: Secondary | ICD-10-CM

## 2021-02-20 LAB — IRON AND TIBC
Iron: 38 ug/dL — ABNORMAL LOW (ref 45–182)
Saturation Ratios: 18 % (ref 17.9–39.5)
TIBC: 207 ug/dL — ABNORMAL LOW (ref 250–450)
UIBC: 169 ug/dL

## 2021-02-20 LAB — HEMOGLOBIN AND HEMATOCRIT, BLOOD
HCT: 36.3 % — ABNORMAL LOW (ref 39.0–52.0)
Hemoglobin: 10.9 g/dL — ABNORMAL LOW (ref 13.0–17.0)

## 2021-02-20 LAB — FERRITIN: Ferritin: 856 ng/mL — ABNORMAL HIGH (ref 24–336)

## 2021-02-20 MED ORDER — EPOETIN ALFA 10000 UNIT/ML IJ SOLN
10000.0000 [IU] | Freq: Once | INTRAMUSCULAR | Status: AC
  Administered 2021-02-20: 12:00:00 10000 [IU] via SUBCUTANEOUS
  Filled 2021-02-20: qty 1

## 2021-02-20 MED ORDER — IVABRADINE HCL 5 MG PO TABS: ORAL_TABLET | ORAL | 6 refills | Status: DC

## 2021-02-20 NOTE — Progress Notes (Signed)
Paramedicine Encounter    Patient ID: Brent [name], male    DOB: 06-18-1945, 75 y.o.   MRN: [id number]  Met with Brent [name] today in the home for a medication review and pill box fill. I reviewed all medications and filled two pill boxes for two weeks.   I also called the [facility] to review medications with them and submit refill request.   They informed me the following meds can and should be filled by their pharmacy. I will send refill notes to providers.   Ferrous sulfate  Farxiga  Remeron Solfenacin Spironolactone  Brent [name] was seen by Internal Med today for PROCRIT infusion. He reports feeling good and had no complaints. We reviewed appointments and I will see him in two weeks.   [facility]ills: [facility]eroquel [facility]) Lasix ([facility]) Budenoside ([facility])  Solfenancin [facility])   ACTION: Home visit completed

## 2021-02-20 NOTE — Progress Notes (Signed)
PATIENT CARE CENTER NOTE     Diagnosis: Anemia associated with Chronic Renal Failure, anemia associated with renal disease     Provider: Elmarie Shiley MD     Procedure: Epoetin Alfa (Procrit) injection     Note: Patient received sub-q 10,000 unit Procrit injection in left arm. Tolerated well. Labs drawn pre-injection and Hemoglobin was 10.9. Patient's BP within parameters at 123/79.  Discharge instructions given. Patient alert, oriented and ambulatory at discharge.

## 2021-02-21 ENCOUNTER — Other Ambulatory Visit: Payer: Self-pay | Admitting: Family

## 2021-02-21 ENCOUNTER — Other Ambulatory Visit (HOSPITAL_COMMUNITY): Payer: Self-pay

## 2021-02-21 DIAGNOSIS — R630 Anorexia: Secondary | ICD-10-CM

## 2021-02-21 DIAGNOSIS — E1122 Type 2 diabetes mellitus with diabetic chronic kidney disease: Secondary | ICD-10-CM

## 2021-02-21 MED ORDER — MIRTAZAPINE 7.5 MG PO TABS: ORAL_TABLET | ORAL | 3 refills | Status: DC

## 2021-02-21 MED ORDER — SOLIFENACIN SUCCINATE 5 MG PO TABS: ORAL_TABLET | ORAL | 1 refills | Status: DC

## 2021-02-21 MED ORDER — SPIRONOLACTONE 25 MG PO TABS: 12.5000 mg | ORAL_TABLET | Freq: Every day | ORAL | 3 refills | Status: DC

## 2021-02-21 MED ORDER — DAPAGLIFLOZIN PROPANEDIOL 10 MG PO TABS: 10.0000 mg | ORAL_TABLET | Freq: Every day | ORAL | 0 refills | Status: DC

## 2021-02-25 ENCOUNTER — Other Ambulatory Visit: Payer: Self-pay

## 2021-02-25 ENCOUNTER — Emergency Department (HOSPITAL_COMMUNITY): Payer: No Typology Code available for payment source

## 2021-02-25 ENCOUNTER — Ambulatory Visit (HOSPITAL_COMMUNITY)
Admission: EM | Admit: 2021-02-25 | Discharge: 2021-02-25 | Disposition: A | Discharge status: Home / Self Care | Payer: No Typology Code available for payment source | Attending: Internal Medicine | Admitting: Internal Medicine

## 2021-02-25 ENCOUNTER — Encounter (HOSPITAL_COMMUNITY): Payer: Self-pay | Admitting: *Deleted

## 2021-02-25 ENCOUNTER — Encounter (HOSPITAL_COMMUNITY): Payer: Self-pay | Admitting: Emergency Medicine

## 2021-02-25 ENCOUNTER — Inpatient Hospital Stay (HOSPITAL_COMMUNITY)
Admission: EM | Admit: 2021-02-25 | Discharge: 2021-03-01 | DRG: 683 | Disposition: A | Discharge status: Skilled Nursing Facility | Payer: No Typology Code available for payment source | Attending: Family Medicine | Admitting: Family Medicine

## 2021-02-25 DIAGNOSIS — N183 Chronic kidney disease, stage 3 unspecified: Secondary | ICD-10-CM | POA: Diagnosis not present

## 2021-02-25 DIAGNOSIS — J449 Chronic obstructive pulmonary disease, unspecified: Secondary | ICD-10-CM | POA: Diagnosis not present

## 2021-02-25 DIAGNOSIS — J8410 Pulmonary fibrosis, unspecified: Secondary | ICD-10-CM | POA: Diagnosis present

## 2021-02-25 DIAGNOSIS — K900 Celiac disease: Secondary | ICD-10-CM | POA: Diagnosis present

## 2021-02-25 DIAGNOSIS — I10 Essential (primary) hypertension: Secondary | ICD-10-CM | POA: Diagnosis present

## 2021-02-25 DIAGNOSIS — M109 Gout, unspecified: Secondary | ICD-10-CM | POA: Diagnosis present

## 2021-02-25 DIAGNOSIS — Z20822 Contact with and (suspected) exposure to covid-19: Secondary | ICD-10-CM | POA: Diagnosis present

## 2021-02-25 DIAGNOSIS — E875 Hyperkalemia: Secondary | ICD-10-CM | POA: Diagnosis present

## 2021-02-25 DIAGNOSIS — E538 Deficiency of other specified B group vitamins: Secondary | ICD-10-CM | POA: Diagnosis present

## 2021-02-25 DIAGNOSIS — M2550 Pain in unspecified joint: Secondary | ICD-10-CM | POA: Diagnosis not present

## 2021-02-25 DIAGNOSIS — N1832 Chronic kidney disease, stage 3b: Secondary | ICD-10-CM | POA: Diagnosis present

## 2021-02-25 DIAGNOSIS — R531 Weakness: Secondary | ICD-10-CM | POA: Diagnosis not present

## 2021-02-25 DIAGNOSIS — N179 Acute kidney failure, unspecified: Secondary | ICD-10-CM | POA: Diagnosis not present

## 2021-02-25 DIAGNOSIS — Z7901 Long term (current) use of anticoagulants: Secondary | ICD-10-CM | POA: Diagnosis not present

## 2021-02-25 DIAGNOSIS — I130 Hypertensive heart and chronic kidney disease with heart failure and stage 1 through stage 4 chronic kidney disease, or unspecified chronic kidney disease: Secondary | ICD-10-CM | POA: Diagnosis present

## 2021-02-25 DIAGNOSIS — Z7401 Bed confinement status: Secondary | ICD-10-CM | POA: Diagnosis not present

## 2021-02-25 DIAGNOSIS — Z95810 Presence of automatic (implantable) cardiac defibrillator: Secondary | ICD-10-CM | POA: Diagnosis not present

## 2021-02-25 DIAGNOSIS — M6281 Muscle weakness (generalized): Secondary | ICD-10-CM | POA: Diagnosis not present

## 2021-02-25 DIAGNOSIS — Z888 Allergy status to other drugs, medicaments and biological substances status: Secondary | ICD-10-CM

## 2021-02-25 DIAGNOSIS — N189 Chronic kidney disease, unspecified: Secondary | ICD-10-CM

## 2021-02-25 DIAGNOSIS — R2681 Unsteadiness on feet: Secondary | ICD-10-CM | POA: Diagnosis not present

## 2021-02-25 DIAGNOSIS — R278 Other lack of coordination: Secondary | ICD-10-CM | POA: Diagnosis not present

## 2021-02-25 DIAGNOSIS — Z79899 Other long term (current) drug therapy: Secondary | ICD-10-CM

## 2021-02-25 DIAGNOSIS — N400 Enlarged prostate without lower urinary tract symptoms: Secondary | ICD-10-CM | POA: Diagnosis present

## 2021-02-25 DIAGNOSIS — I5022 Chronic systolic (congestive) heart failure: Secondary | ICD-10-CM | POA: Diagnosis present

## 2021-02-25 DIAGNOSIS — I6782 Cerebral ischemia: Secondary | ICD-10-CM | POA: Diagnosis not present

## 2021-02-25 DIAGNOSIS — E1122 Type 2 diabetes mellitus with diabetic chronic kidney disease: Secondary | ICD-10-CM | POA: Diagnosis not present

## 2021-02-25 DIAGNOSIS — D539 Nutritional anemia, unspecified: Secondary | ICD-10-CM | POA: Diagnosis present

## 2021-02-25 DIAGNOSIS — Z87891 Personal history of nicotine dependence: Secondary | ICD-10-CM

## 2021-02-25 DIAGNOSIS — R1310 Dysphagia, unspecified: Secondary | ICD-10-CM | POA: Diagnosis not present

## 2021-02-25 DIAGNOSIS — I5042 Chronic combined systolic (congestive) and diastolic (congestive) heart failure: Secondary | ICD-10-CM | POA: Diagnosis not present

## 2021-02-25 DIAGNOSIS — Z7952 Long term (current) use of systemic steroids: Secondary | ICD-10-CM | POA: Diagnosis not present

## 2021-02-25 DIAGNOSIS — E860 Dehydration: Secondary | ICD-10-CM | POA: Diagnosis present

## 2021-02-25 DIAGNOSIS — Z9049 Acquired absence of other specified parts of digestive tract: Secondary | ICD-10-CM

## 2021-02-25 DIAGNOSIS — M6259 Muscle wasting and atrophy, not elsewhere classified, multiple sites: Secondary | ICD-10-CM | POA: Diagnosis not present

## 2021-02-25 LAB — BASIC METABOLIC PANEL
Anion gap: 6 (ref 5–15)
BUN: 32 mg/dL — ABNORMAL HIGH (ref 8–23)
CO2: 32 mmol/L (ref 22–32)
Calcium: 9.7 mg/dL (ref 8.9–10.3)
Chloride: 98 mmol/L (ref 98–111)
Creatinine, Ser: 3.62 mg/dL — ABNORMAL HIGH (ref 0.61–1.24)
GFR, Estimated: 17 mL/min — ABNORMAL LOW (ref >60)
Glucose, Bld: 95 mg/dL (ref 70–99)
Potassium: 6.1 mmol/L — ABNORMAL HIGH (ref 3.5–5.1)
Sodium: 136 mmol/L (ref 135–145)

## 2021-02-25 LAB — URINALYSIS, ROUTINE W REFLEX MICROSCOPIC
Bilirubin Urine: NEGATIVE
Glucose, UA: NEGATIVE mg/dL
Hgb urine dipstick: NEGATIVE
Ketones, ur: 5 mg/dL — AB
Leukocytes,Ua: NEGATIVE
Nitrite: NEGATIVE
Protein, ur: NEGATIVE mg/dL
Specific Gravity, Urine: 1.017 (ref 1.005–1.030)
pH: 8 (ref 5.0–8.0)

## 2021-02-25 LAB — MAGNESIUM: Magnesium: 2.2 mg/dL (ref 1.7–2.4)

## 2021-02-25 LAB — CBC
HCT: 38.5 % — ABNORMAL LOW (ref 39.0–52.0)
Hemoglobin: 11.8 g/dL — ABNORMAL LOW (ref 13.0–17.0)
MCH: 34.3 pg — ABNORMAL HIGH (ref 26.0–34.0)
MCHC: 30.6 g/dL (ref 30.0–36.0)
MCV: 111.9 fL — ABNORMAL HIGH (ref 80.0–100.0)
Platelets: 165 10*3/uL (ref 150–400)
RBC: 3.44 MIL/uL — ABNORMAL LOW (ref 4.22–5.81)
RDW: 14 % (ref 11.5–15.5)
WBC: 13.3 10*3/uL — ABNORMAL HIGH (ref 4.0–10.5)
nRBC: 0 % (ref 0.0–0.2)

## 2021-02-25 LAB — TROPONIN I (HIGH SENSITIVITY)
Troponin I (High Sensitivity): 16 ng/L (ref <18)
Troponin I (High Sensitivity): 17 ng/L (ref <18)

## 2021-02-25 IMAGING — DX PORTABLE CHEST - 1 VIEW
1 series · 2 of 2 positions shown · non-contrast
Comparison: Chest x-ray dated August 20, 2018

CLINICAL DATA: Shortness of breath and wheezing

EXAM:
PORTABLE CHEST 1 VIEW

[Series 1: chest ap · 0.14mm/px · 2 of 2 slices shown]
[im 1/2]
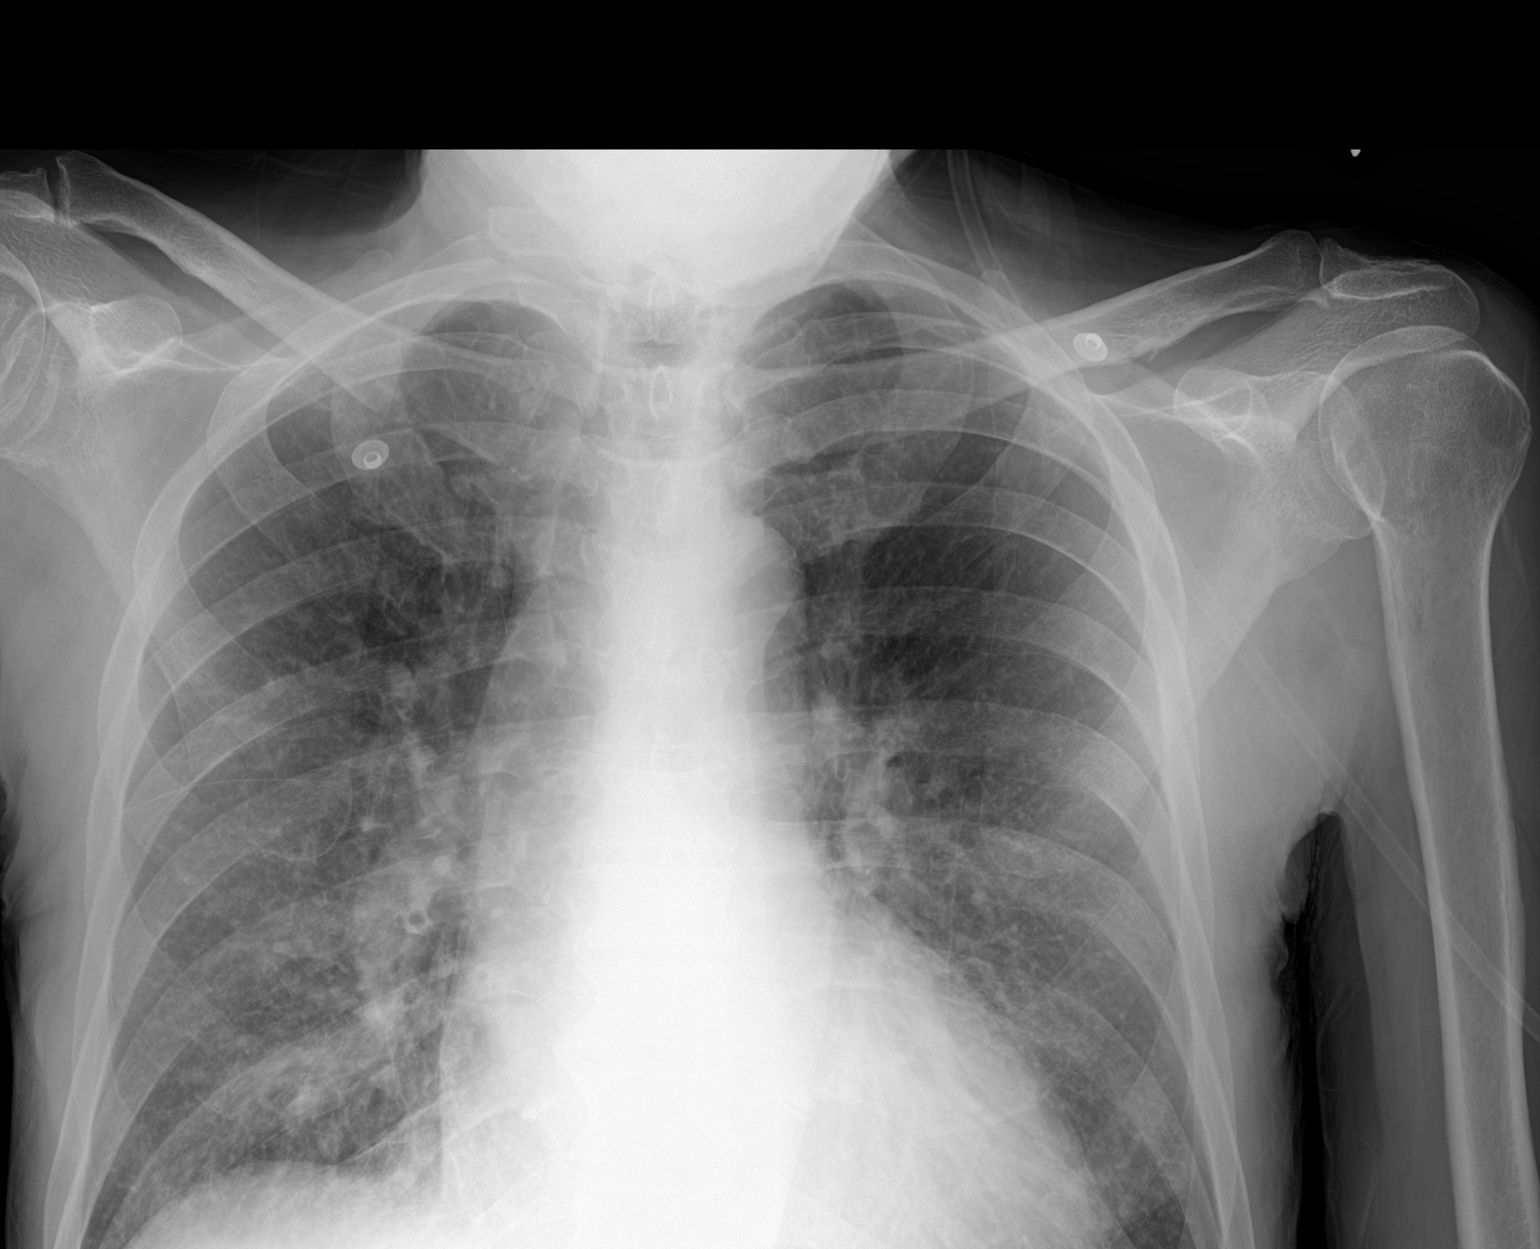
[im 2/2]
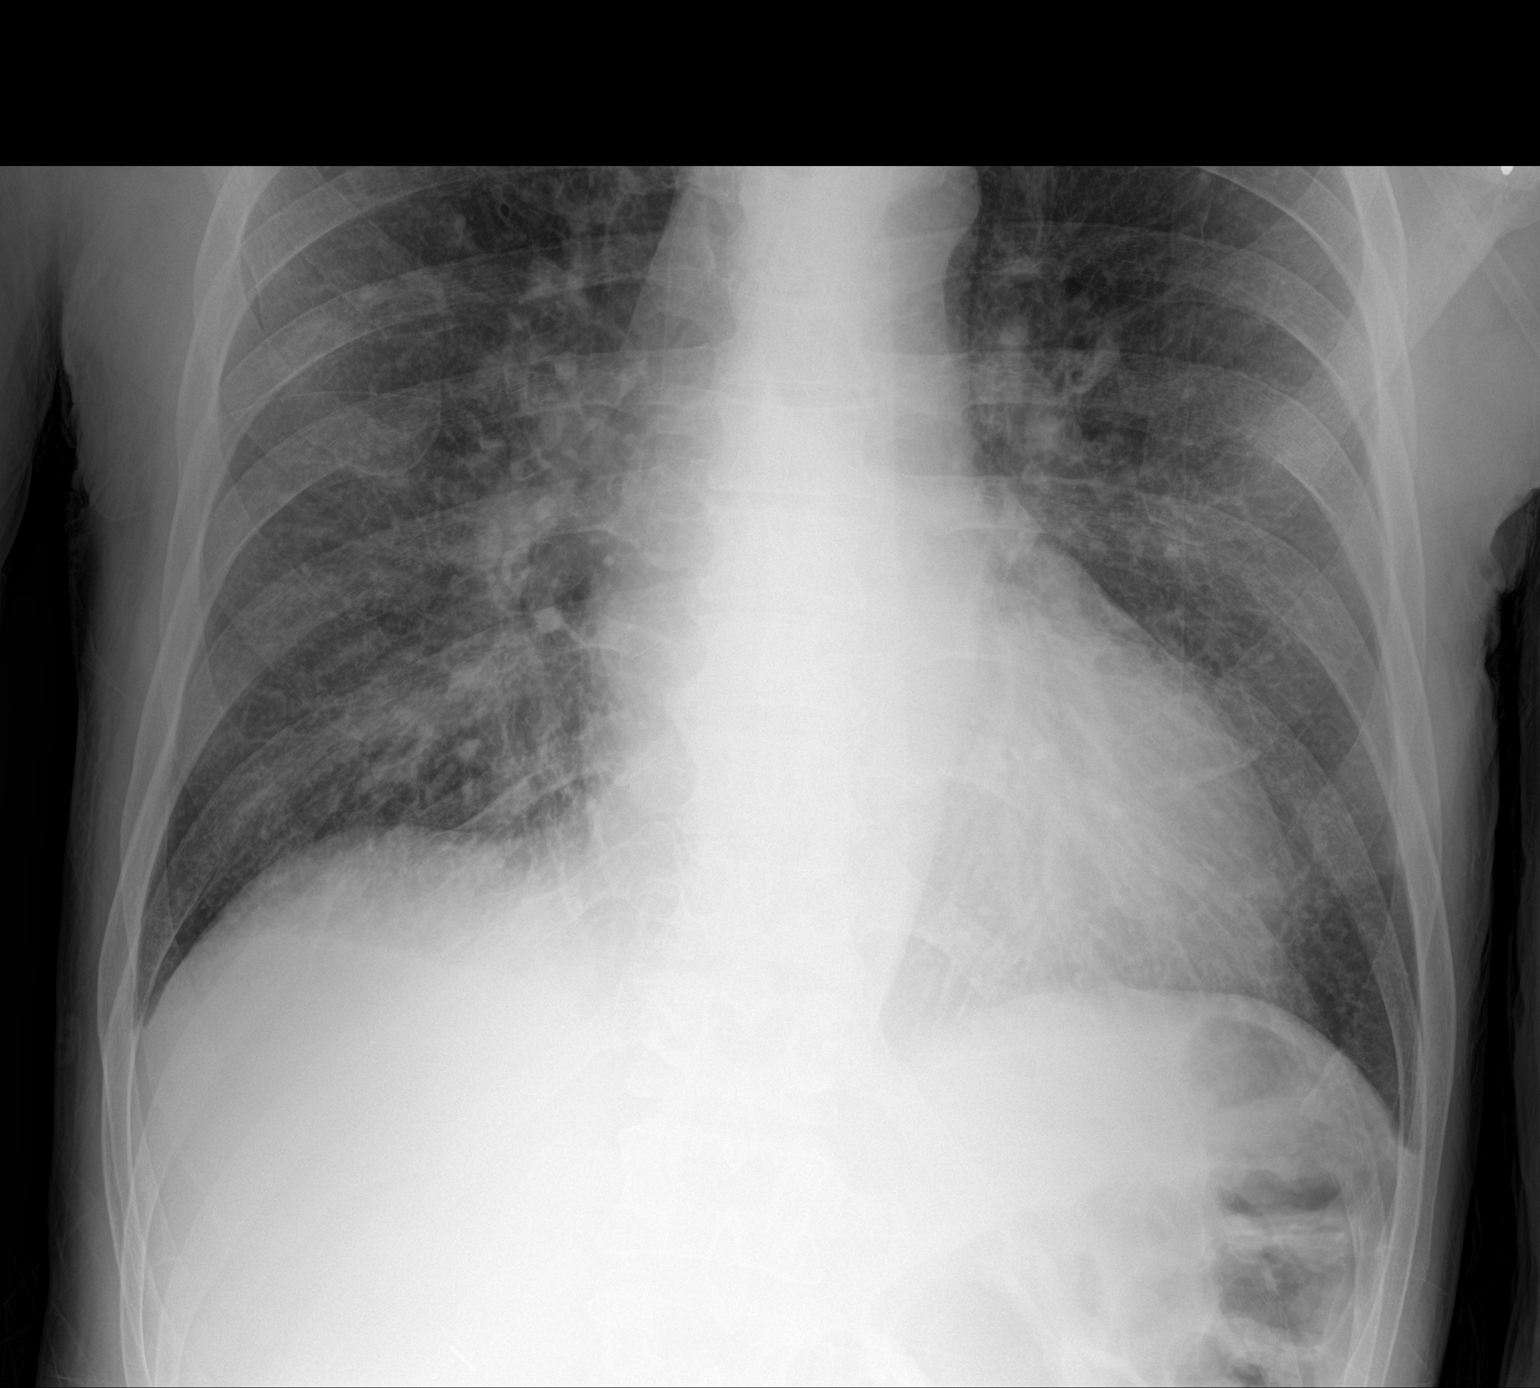

[2 of 2 positions shown; findings below may reference images not displayed]

FINDINGS: The heart size is enlarged. There are new ground-glass airspace
opacities bilaterally. There is no pneumothorax. No large pleural
effusion. There is no acute osseous abnormality.
IMPRESSION: Cardiomegaly with findings suspicious for developing pulmonary
edema. An atypical infectious process can have a similar appearance
in the appropriate clinical setting.

## 2021-02-25 MED ORDER — DAPAGLIFLOZIN PROPANEDIOL 10 MG PO TABS: 10.0000 mg | ORAL_TABLET | Freq: Every day | ORAL | Status: DC

## 2021-02-25 MED ORDER — SODIUM ZIRCONIUM CYCLOSILICATE 10 G PO PACK
10.0000 g | PACK | Freq: Once | ORAL | Status: AC
  Administered 2021-02-25: 10 g via ORAL
  Filled 2021-02-25: qty 1

## 2021-02-25 MED ORDER — ALLOPURINOL 100 MG PO TABS
200.0000 mg | ORAL_TABLET | Freq: Every day | ORAL | Status: DC
  Administered 2021-02-26: 200 mg via ORAL
  Filled 2021-02-25 (×3): qty 2

## 2021-02-25 MED ORDER — METOPROLOL SUCCINATE ER 25 MG PO TB24
37.5000 mg | ORAL_TABLET | Freq: Every day | ORAL | Status: DC
Start: 2021-02-26 — End: 2021-03-02
  Administered 2021-02-26 – 2021-03-01 (×4): 37.5 mg via ORAL
  Filled 2021-02-25 (×4): qty 2

## 2021-02-25 MED ORDER — ACETAMINOPHEN 650 MG RE SUPP: 650.0000 mg | Freq: Four times a day (QID) | RECTAL | Status: DC | PRN

## 2021-02-25 MED ORDER — LACTATED RINGERS IV BOLUS
500.0000 mL | Freq: Once | INTRAVENOUS | Status: AC
  Administered 2021-02-25: 500 mL via INTRAVENOUS

## 2021-02-25 MED ORDER — ACETAMINOPHEN 325 MG PO TABS: 650.0000 mg | ORAL_TABLET | Freq: Four times a day (QID) | ORAL | Status: DC | PRN

## 2021-02-25 MED ORDER — APIXABAN 5 MG PO TABS
5.0000 mg | ORAL_TABLET | Freq: Two times a day (BID) | ORAL | Status: DC
  Administered 2021-02-26 – 2021-03-01 (×8): 5 mg via ORAL
  Filled 2021-02-25 (×8): qty 1

## 2021-02-25 MED ORDER — IPRATROPIUM-ALBUTEROL 0.5-2.5 (3) MG/3ML IN SOLN
3.0000 mL | RESPIRATORY_TRACT | Status: DC | PRN
  Administered 2021-03-01: 3 mL via RESPIRATORY_TRACT
  Filled 2021-02-25: qty 3

## 2021-02-25 MED ORDER — LACTATED RINGERS IV SOLN: INTRAVENOUS | Status: DC

## 2021-02-25 MED ORDER — ADULT MULTIVITAMIN W/MINERALS CH
1.0000 | ORAL_TABLET | Freq: Every day | ORAL | Status: DC
  Administered 2021-02-26 – 2021-03-01 (×4): 1 via ORAL
  Filled 2021-02-25 (×4): qty 1

## 2021-02-25 MED ORDER — MIRTAZAPINE 15 MG PO TABS
7.5000 mg | ORAL_TABLET | Freq: Every day | ORAL | Status: DC
  Administered 2021-02-26 – 2021-02-28 (×3): 7.5 mg via ORAL
  Filled 2021-02-25 (×6): qty 1

## 2021-02-25 MED ORDER — SODIUM CHLORIDE 0.9 % IV BOLUS
500.0000 mL | Freq: Once | INTRAVENOUS | Status: AC
  Administered 2021-02-26: 500 mL via INTRAVENOUS

## 2021-02-25 MED ORDER — ONDANSETRON HCL 4 MG PO TABS: 4.0000 mg | ORAL_TABLET | Freq: Four times a day (QID) | ORAL | Status: DC | PRN

## 2021-02-25 MED ORDER — TAMSULOSIN HCL 0.4 MG PO CAPS
0.4000 mg | ORAL_CAPSULE | Freq: Every day | ORAL | Status: DC
Start: 2021-02-26 — End: 2021-03-02
  Administered 2021-02-26 – 2021-03-01 (×4): 0.4 mg via ORAL
  Filled 2021-02-25 (×4): qty 1

## 2021-02-25 MED ORDER — ISOSORBIDE MONONITRATE ER 60 MG PO TB24
60.0000 mg | ORAL_TABLET | Freq: Every day | ORAL | Status: DC
Start: 2021-02-26 — End: 2021-03-02
  Administered 2021-02-26 – 2021-03-01 (×4): 60 mg via ORAL
  Filled 2021-02-25: qty 1
  Filled 2021-02-25: qty 2
  Filled 2021-02-25 (×2): qty 1

## 2021-02-25 MED ORDER — CALCIUM GLUCONATE-NACL 1-0.675 GM/50ML-% IV SOLN
1.0000 g | Freq: Once | INTRAVENOUS | Status: AC
Start: 2021-02-25 — End: 2021-02-26
  Administered 2021-02-25: 1000 mg via INTRAVENOUS
  Filled 2021-02-25: qty 50

## 2021-02-25 MED ORDER — ONDANSETRON HCL 4 MG/2ML IJ SOLN: 4.0000 mg | Freq: Four times a day (QID) | INTRAMUSCULAR | Status: DC | PRN

## 2021-02-25 MED ORDER — SENNOSIDES-DOCUSATE SODIUM 8.6-50 MG PO TABS: 1.0000 | ORAL_TABLET | Freq: Every evening | ORAL | Status: DC | PRN
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
## 2021-02-25 NOTE — ED Notes (Signed)
[facility] Provider at Pt side to eval . Pt instructed by Provider to go to ED.

## 2021-02-25 NOTE — ED Provider Notes (Signed)
Emergency Medicine Provider Triage Evaluation Note  Brent [name] , a 76 y.o. male  was evaluated in triage.  Pt complains of generalized weakness and near syncope episodes over the last several days.  He does report similar symptoms a year ago.  Patient states today that he was walking to his car when he felt generally weak and lowered himself to the ground without any injury and was unable to get up.  A bystander lifted him up.  No chest pain, shortness of breath, fever, chills, abdominal pain, nausea, vomiting, diarrhea, headache, focal weakness/numbness.  Review of Systems  Positive:  Negative: See above   Physical Exam  BP 116/72 (BP Location: Left Arm)    Pulse 100    Temp 98.6 F (37 C)    Resp 16    SpO2 93%  Gen:   Awake, no distress   Resp:  Normal effort  MSK:   Moves extremities without difficulty  Other:    Medical Decision Making  Medically screening exam initiated at 4:14 PM.  Appropriate orders placed.  MANDY FITZWATER was informed that the remainder of the evaluation will be completed by another provider, this initial triage assessment does not replace that evaluation, and the importance of remaining in the ED until their evaluation is complete.     Hendricks Limes, PA-C 02/25/21 1617    Jeanell Sparrow, DO 02/26/21 [phone]

## 2021-02-25 NOTE — ED Triage Notes (Signed)
Pt reports he was trying to walk to his car and was so weak he stopped and sat on floor . Pt unable to get up . Pt reported two men stopped to help him get up into his car. Pt reports a neighbor drove him to [facility]. Pt denies any N/V/D. Pt does say his MAG and Potassium has been low before.
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
## 2021-02-25 NOTE — ED Triage Notes (Signed)
Pt c/o sudden onset weakness while walking today causing him to fall. Denies hitting his head  or LOC. Denies chest pain, shortness of breath.

## 2021-02-26 DIAGNOSIS — N179 Acute kidney failure, unspecified: Secondary | ICD-10-CM | POA: Diagnosis not present

## 2021-02-26 DIAGNOSIS — N189 Chronic kidney disease, unspecified: Secondary | ICD-10-CM | POA: Diagnosis not present

## 2021-02-26 LAB — RESP PANEL BY RT-PCR (FLU A&B, COVID) ARPGX2
Influenza A by PCR: NEGATIVE
Influenza B by PCR: NEGATIVE
SARS Coronavirus 2 by RT PCR: NEGATIVE

## 2021-02-26 LAB — BASIC METABOLIC PANEL
Anion gap: 8 (ref 5–15)
BUN: 33 mg/dL — ABNORMAL HIGH (ref 8–23)
CO2: 28 mmol/L (ref 22–32)
Calcium: 9.2 mg/dL (ref 8.9–10.3)
Chloride: 100 mmol/L (ref 98–111)
Creatinine, Ser: 3.34 mg/dL — ABNORMAL HIGH (ref 0.61–1.24)
GFR, Estimated: 18 mL/min — ABNORMAL LOW (ref >60)
Glucose, Bld: 118 mg/dL — ABNORMAL HIGH (ref 70–99)
Potassium: 5.2 mmol/L — ABNORMAL HIGH (ref 3.5–5.1)
Sodium: 136 mmol/L (ref 135–145)

## 2021-02-26 LAB — CBC
HCT: 36.2 % — ABNORMAL LOW (ref 39.0–52.0)
Hemoglobin: 11.1 g/dL — ABNORMAL LOW (ref 13.0–17.0)
MCH: 34.3 pg — ABNORMAL HIGH (ref 26.0–34.0)
MCHC: 30.7 g/dL (ref 30.0–36.0)
MCV: 111.7 fL — ABNORMAL HIGH (ref 80.0–100.0)
Platelets: 146 10*3/uL — ABNORMAL LOW (ref 150–400)
RBC: 3.24 MIL/uL — ABNORMAL LOW (ref 4.22–5.81)
RDW: 13.8 % (ref 11.5–15.5)
WBC: 10.7 10*3/uL — ABNORMAL HIGH (ref 4.0–10.5)
nRBC: 0 % (ref 0.0–0.2)

## 2021-02-26 LAB — HEMOGLOBIN A1C
Hgb A1c MFr Bld: 4.9 % (ref 4.8–5.6)
Mean Plasma Glucose: 93.93 mg/dL

## 2021-02-26 IMAGING — DX CHEST - 2 VIEW
2 series · 2 of 2 positions shown · non-contrast
Comparison: August 31, 2018

CLINICAL DATA: Shortness of breath history of Crohn's disease

EXAM:
CHEST - 2 VIEW

[chest pa]
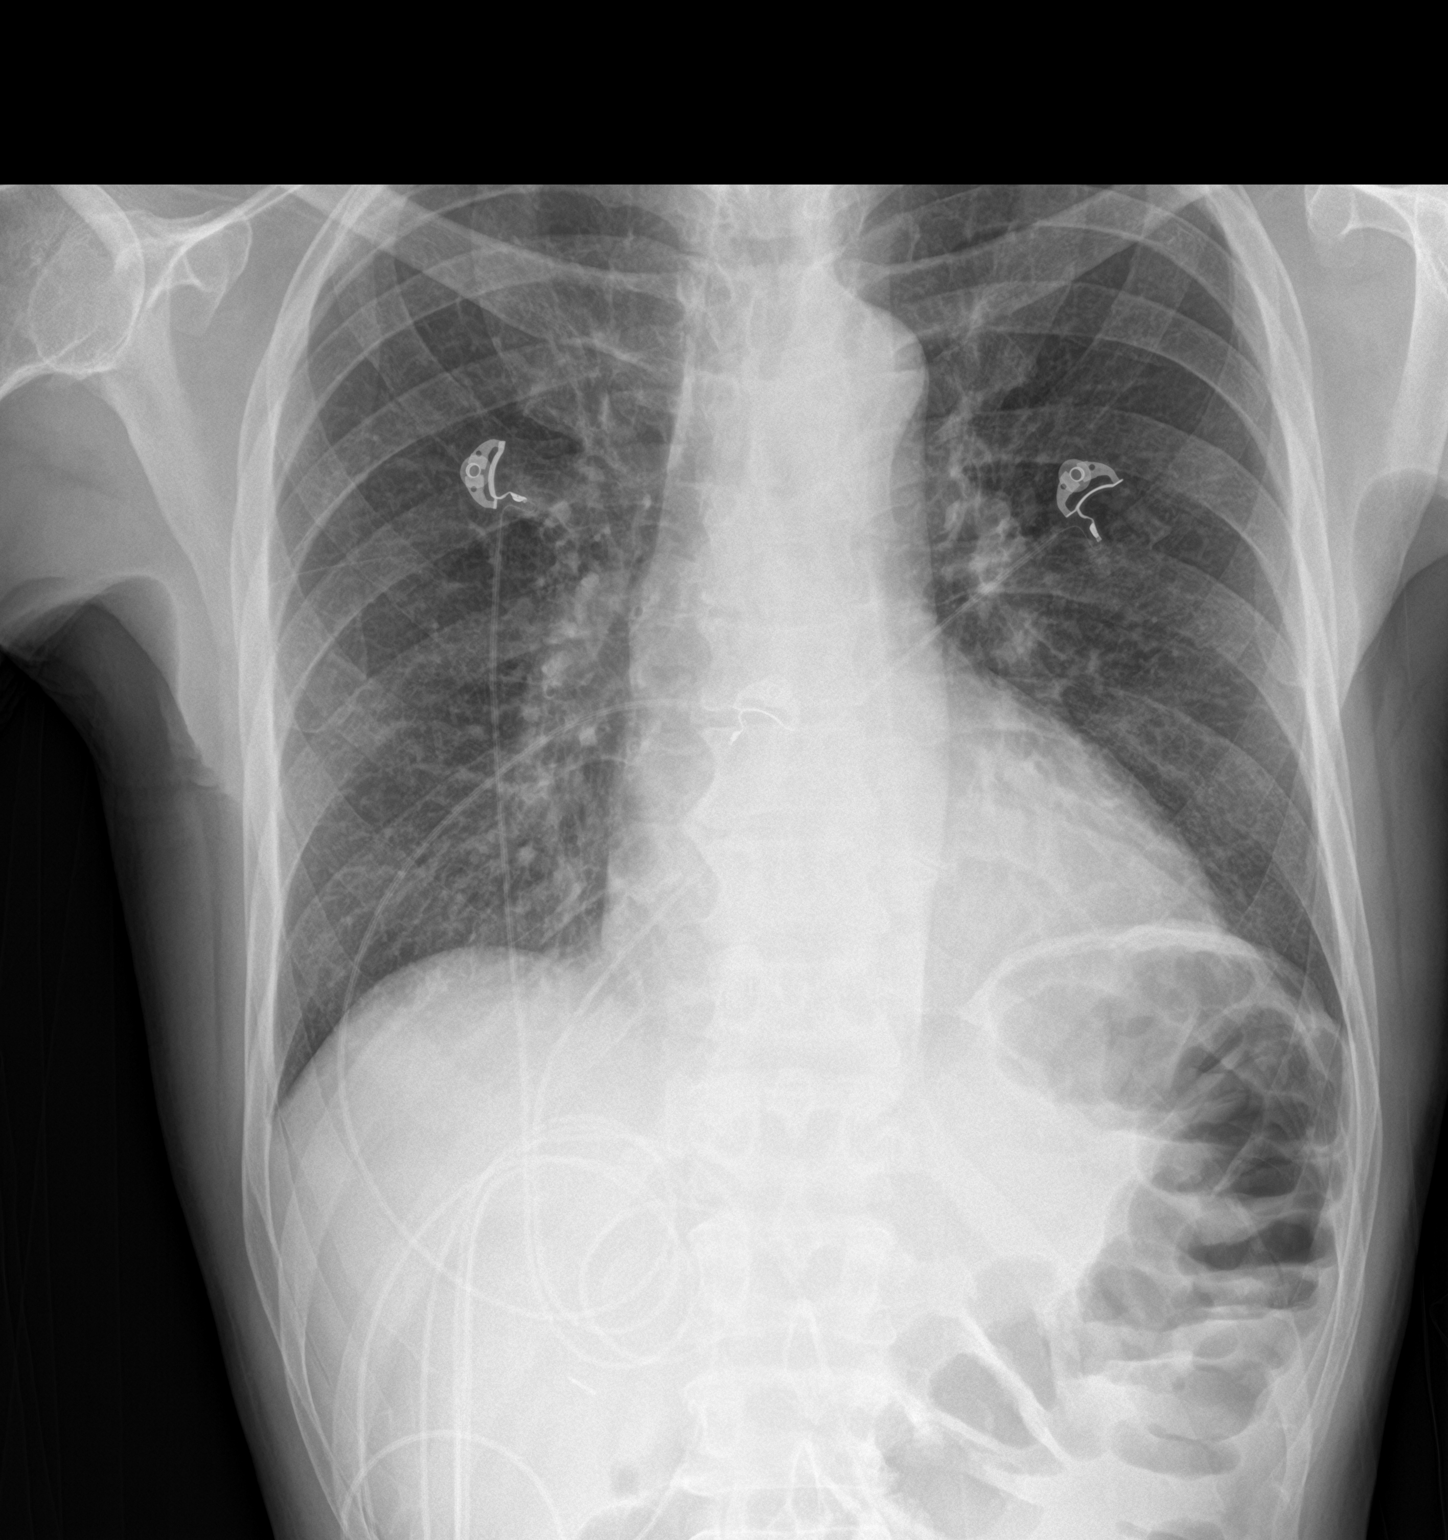

[chest lat]
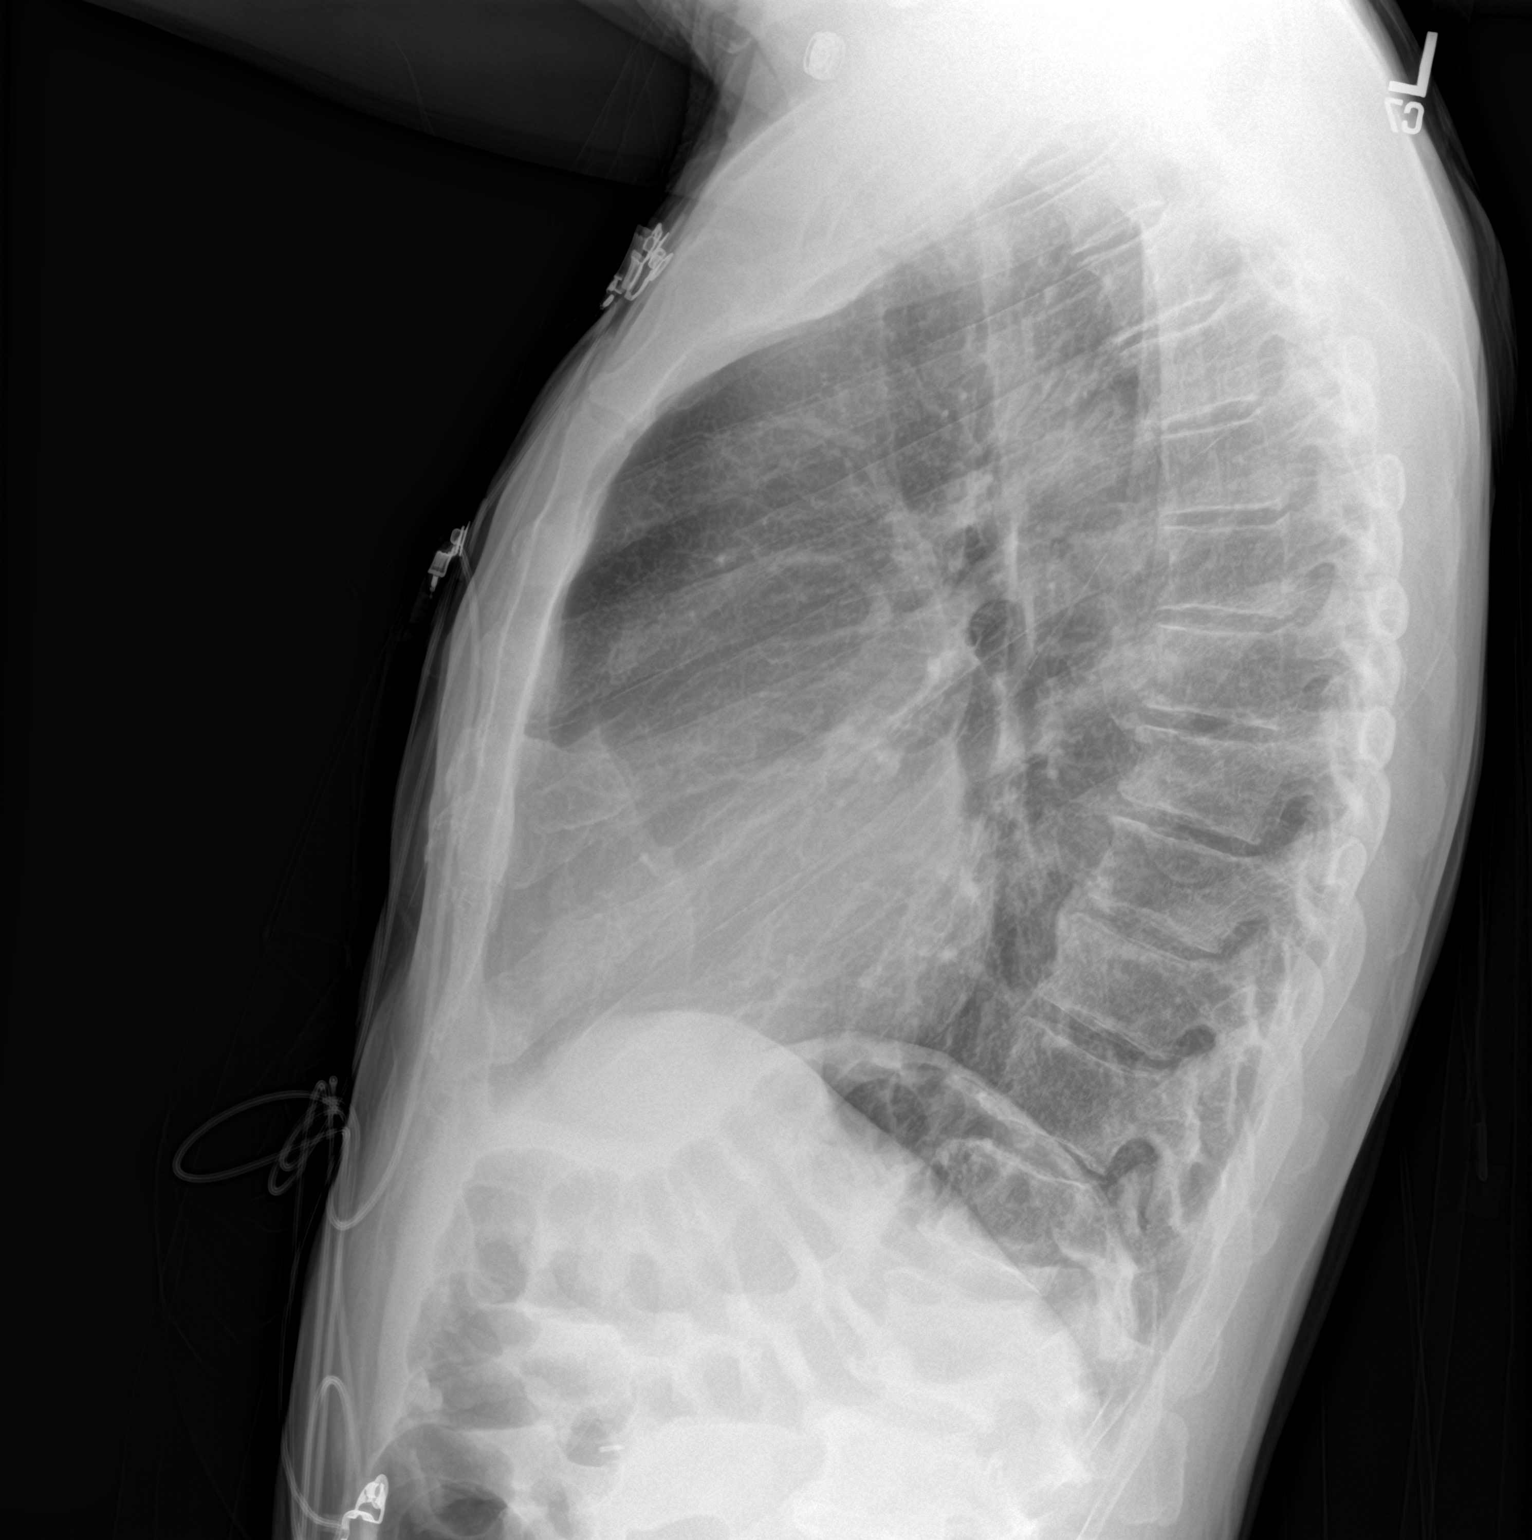

[2 of 2 positions shown; findings below may reference images not displayed]

FINDINGS: The heart size and mediastinal contours are stable. Both lungs are
clear. The visualized skeletal structures are unremarkable.
IMPRESSION: No active cardiopulmonary disease.

## 2021-02-26 MED ORDER — SODIUM CHLORIDE 0.9 % IV SOLN: INTRAVENOUS | Status: DC

## 2021-02-26 MED ORDER — PANTOPRAZOLE SODIUM 40 MG PO TBEC
40.0000 mg | DELAYED_RELEASE_TABLET | Freq: Every day | ORAL | Status: DC
  Administered 2021-02-26 – 2021-03-01 (×4): 40 mg via ORAL
  Filled 2021-02-26 (×4): qty 1

## 2021-02-26 MED ORDER — OXYCODONE-ACETAMINOPHEN 5-325 MG PO TABS
1.0000 | ORAL_TABLET | Freq: Four times a day (QID) | ORAL | Status: AC | PRN
  Administered 2021-02-26 (×3): 1 via ORAL
  Filled 2021-02-26 (×3): qty 1

## 2021-02-26 MED ORDER — DIVALPROEX SODIUM ER 500 MG PO TB24
1500.0000 mg | ORAL_TABLET | Freq: Every day | ORAL | Status: DC
  Administered 2021-02-26 – 2021-02-28 (×3): 1500 mg via ORAL
  Filled 2021-02-26 (×3): qty 3

## 2021-02-26 MED ORDER — FERROUS SULFATE 325 (65 FE) MG PO TABS
325.0000 mg | ORAL_TABLET | Freq: Two times a day (BID) | ORAL | Status: DC
  Administered 2021-02-26 – 2021-03-01 (×7): 325 mg via ORAL
  Filled 2021-02-26 (×7): qty 1

## 2021-02-26 MED ORDER — QUETIAPINE FUMARATE 100 MG PO TABS
150.0000 mg | ORAL_TABLET | Freq: Every day | ORAL | Status: DC
  Administered 2021-02-26 – 2021-02-28 (×3): 150 mg via ORAL
  Filled 2021-02-26 (×3): qty 1

## 2021-02-26 MED ORDER — IVABRADINE HCL 5 MG PO TABS
5.0000 mg | ORAL_TABLET | Freq: Two times a day (BID) | ORAL | Status: DC
  Administered 2021-02-26 – 2021-03-01 (×7): 5 mg via ORAL
  Filled 2021-02-26 (×8): qty 1

## 2021-02-26 MED ORDER — BUDESONIDE 3 MG PO CPEP
9.0000 mg | ORAL_CAPSULE | Freq: Every day | ORAL | Status: DC
  Administered 2021-02-26 – 2021-03-01 (×4): 9 mg via ORAL
  Filled 2021-02-26 (×4): qty 3

## 2021-02-26 MED ORDER — VITAMIN D 25 MCG (1000 UNIT) PO TABS
5000.0000 [IU] | ORAL_TABLET | Freq: Every day | ORAL | Status: DC
  Administered 2021-02-26 – 2021-03-01 (×4): 5000 [IU] via ORAL
  Filled 2021-02-26 (×4): qty 5

## 2021-02-26 NOTE — Consult Note (Signed)
° °  [facility] Inpatient Consult   02/26/2021  Brent [name] 28-Jul-1945 [id number]  [facility] [[facility]] Patient:   Primary Care Provider:  Sandrea Hughs, NP, [facility]  Patient screened for hospitalization with noted extreme high risk score for unplanned readmission risk and to assess for restart of [facility] service needs for post hospital transition.  Came by to see patient and he was in patient care.  Plan:  Continue to follow progress and disposition to assess for post hospital care management needs.    For questions contact:   Natividad Brood, RN BSN [facility] Liaison  [phone] business mobile phone Toll free office [phone]  Fax number: [phone] [email or web address]

## 2021-02-26 NOTE — ED Notes (Signed)
Pt refused PT

## 2021-02-26 NOTE — ED Notes (Signed)
Physical There. At the bedside

## 2021-02-26 NOTE — Progress Notes (Signed)
PT Cancellation Note  Patient Details Name: Brent [name] MRN: [id number] DOB: 07/21/1945   Cancelled Treatment:    Reason Eval/Treat Not Completed: Patient declined, no reason specified.  Pt declined as he feels he is at baseline, will recheck tomorrow and asked him to let nursing know if he feels differently and wants to have PT.   Ramond Dial 02/26/2021, 12:41 PM  Mee Hives, PT PhD Acute Rehab Dept. Number: [place] and [place]
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
## 2021-02-27 DIAGNOSIS — N179 Acute kidney failure, unspecified: Secondary | ICD-10-CM | POA: Diagnosis not present

## 2021-02-27 DIAGNOSIS — N189 Chronic kidney disease, unspecified: Secondary | ICD-10-CM | POA: Diagnosis not present

## 2021-02-27 LAB — BASIC METABOLIC PANEL
Anion gap: 5 (ref 5–15)
BUN: 36 mg/dL — ABNORMAL HIGH (ref 8–23)
CO2: 27 mmol/L (ref 22–32)
Calcium: 8.9 mg/dL (ref 8.9–10.3)
Chloride: 102 mmol/L (ref 98–111)
Creatinine, Ser: 3.01 mg/dL — ABNORMAL HIGH (ref 0.61–1.24)
GFR, Estimated: 21 mL/min — ABNORMAL LOW (ref >60)
Glucose, Bld: 113 mg/dL — ABNORMAL HIGH (ref 70–99)
Potassium: 5.7 mmol/L — ABNORMAL HIGH (ref 3.5–5.1)
Sodium: 134 mmol/L — ABNORMAL LOW (ref 135–145)

## 2021-02-27 LAB — CBC WITH DIFFERENTIAL/PLATELET
Abs Immature Granulocytes: 0.06 10*3/uL (ref 0.00–0.07)
Basophils Absolute: 0.1 10*3/uL (ref 0.0–0.1)
Basophils Relative: 1 %
Eosinophils Absolute: 0.1 10*3/uL (ref 0.0–0.5)
Eosinophils Relative: 2 %
HCT: 33 % — ABNORMAL LOW (ref 39.0–52.0)
Hemoglobin: 10.5 g/dL — ABNORMAL LOW (ref 13.0–17.0)
Immature Granulocytes: 1 %
Lymphocytes Relative: 20 %
Lymphs Abs: 1.7 10*3/uL (ref 0.7–4.0)
MCH: 34.3 pg — ABNORMAL HIGH (ref 26.0–34.0)
MCHC: 31.8 g/dL (ref 30.0–36.0)
MCV: 107.8 fL — ABNORMAL HIGH (ref 80.0–100.0)
Monocytes Absolute: 0.7 10*3/uL (ref 0.1–1.0)
Monocytes Relative: 9 %
Neutro Abs: 5.9 10*3/uL (ref 1.7–7.7)
Neutrophils Relative %: 67 %
Platelets: 145 10*3/uL — ABNORMAL LOW (ref 150–400)
RBC: 3.06 MIL/uL — ABNORMAL LOW (ref 4.22–5.81)
RDW: 13.8 % (ref 11.5–15.5)
WBC: 8.6 10*3/uL (ref 4.0–10.5)
nRBC: 0 % (ref 0.0–0.2)

## 2021-02-27 LAB — MAGNESIUM: Magnesium: 1.8 mg/dL (ref 1.7–2.4)

## 2021-02-27 LAB — POTASSIUM: Potassium: 4.9 mmol/L (ref 3.5–5.1)

## 2021-02-27 LAB — GLUCOSE, CAPILLARY: Glucose-Capillary: 83 mg/dL (ref 70–99)

## 2021-02-27 MED ORDER — INSULIN ASPART 100 UNIT/ML IV SOLN
10.0000 [IU] | Freq: Once | INTRAVENOUS | Status: AC
  Administered 2021-02-27: 10 [IU] via INTRAVENOUS

## 2021-02-27 MED ORDER — DEXTROSE 50 % IV SOLN
1.0000 | Freq: Once | INTRAVENOUS | Status: AC
  Administered 2021-02-27: 50 mL via INTRAVENOUS
  Filled 2021-02-27: qty 50

## 2021-02-27 NOTE — Progress Notes (Signed)
Tele monitor called pt HR 140 Pt assessed sitting with family writing a check, got a little excited. Dr. Doristine Bosworth paged. New orders followed.
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
## 2021-02-27 NOTE — Progress Notes (Signed)
PROGRESS NOTE    Brent [name]  ZOX:[id number] DOB: 04-10-1945 DOA: 02/25/2021 PCP: Sandrea Hughs, NP   Brief Narrative:  Brent [name] is a 76 y.o. male with medical history significant for CKD 3B, HTN, COPD, HFrEF, celiac disease, BPH, gout who presented for evaluation of generalized weakness for 4 to 5 days. No loss of consciousness or any falls.  He does have an AICD in place. Has a history of smoking but quit over 20 years ago.  Denies alcohol or illicit drug use.  Upon arrival to the ED, he was hemodynamically stable with hyperkalemia 6.1 and creatinine of 3.62 which is elevated from his baseline of 2.15.  He was given Lokelma and calcium gluconate in the ED and admitted to hospital service.  Assessment & Plan:   Principal Problem:   Acute kidney injury superimposed on chronic kidney disease ([place]) Active Problems:   Essential hypertension, benign   Chronic obstructive pulmonary disease (HCC)   Chronic systolic CHF (congestive heart failure) (HCC)   Type 2 diabetes mellitus with stage 3 chronic kidney disease (HCC)   ICD (implantable cardioverter-defibrillator) in place   Generalized weakness  Acute kidney injury superimposed on chronic kidney disease 3B: Baseline creatinine around 2.15, presented with 3.62, slowly improving and down to 3.01 today.  Continue IV fluids.  Continue to avoid nephrotoxic agents.   Hyperkalemia: Further rise to 5.7 today.  We will try dextrose and insulin and recheck in few hours.  Monitor on telemetry.   Chronic systolic congestive heart failure: Appears dehydrated. Continue metoprolol, hold Lasix.Also hold Farxiga due to hyperkalemia.   Generalized weakness: PT OT consulted but he refused to be seen yesterday.  He is encouraged and advised to allow PT to assess him.   Essential hypertension, benign: Diastolic slightly elevated. Continue Toprol-XL, Imdur.   Chronic obstructive pulmonary disease  Stable.  DuoNebs every 4 hours as needed   ICD  (implantable cardioverter-defibrillator) in place Stable  BPH: Flomax.  DVT prophylaxis:    Code Status: Full Code  Family Communication:  None present at bedside.  Plan of care discussed with patient in length and he verbalized understanding and agreed with it.  Status is: Inpatient  Remains inpatient appropriate because: Still dehydrated and needs to be assessed by PT OT.  Estimated body mass index is 20.09 kg/m as calculated from the following:   Height as of this encounter: 6' 3"  (1.905 m).   Weight as of this encounter: 72.9 kg.  Nutritional Assessment: Body mass index is 20.09 kg/m.Marland Kitchen Seen by dietician.  I agree with the assessment and plan as outlined below: Nutrition Status:  Skin Assessment: I have examined the patient's skin and I agree with the wound assessment as performed by the wound care RN as outlined below:    Consultants:  None  Procedures:  None  Antimicrobials:  Anti-infectives (From admission, onward)    None          Subjective: Patient seen and examined.  He has no complaints but he is frustrated because today is fourth of the month and he has not been able to pay his rent and he tells me that his son has defaulted him for $17,000 and he just is frustrated and keeps talking about that.  I did counsel him to not to refuse to be seen by PT today.  Objective: Vitals:   02/26/21 1515 02/26/21 1546 02/26/21 1600 02/26/21 2103  BP: 134/75 (!) 144/110  (!) 148/86  Pulse: (!) 107 Marland Kitchen)  107  100  Resp: (!) 23 19  18   Temp:  98.5 F (36.9 C)  98.5 F (36.9 C)  TempSrc:  Oral  Oral  SpO2: 93% 94%  96%  Weight:   72.9 kg   Height:        Intake/Output Summary (Last 24 hours) at 02/27/2021 0806 Last data filed at 02/27/2021 0543 Gross per 24 hour  Intake 2980.11 ml  Output 1250 ml  Net 1730.11 ml    Filed Weights   02/26/21 1144 02/26/21 1600  Weight: 73.3 kg 72.9 kg    Examination:  General exam: Appears calm and comfortable   Respiratory system: Clear to auscultation. Respiratory effort normal. Cardiovascular system: S1 & S2 heard, RRR. No JVD, murmurs, rubs, gallops or clicks. No pedal edema. Gastrointestinal system: Abdomen is nondistended, soft and nontender. No organomegaly or masses felt. Normal bowel sounds heard. Central nervous system: Alert and oriented. No focal neurological deficits. Extremities: Symmetric 5 x 5 power. Skin: No rashes, lesions or ulcers.  Psychiatry: Judgement and insight appear normal. Mood & affect appropriate.   Data Reviewed: I have personally reviewed following labs and imaging studies  CBC: Recent Labs  Lab 02/20/21 1055 02/25/21 1614 02/26/21 0319 02/27/21 0310  WBC  --  13.3* 10.7* 8.6  NEUTROABS  --   --   --  5.9  HGB 10.9* 11.8* 11.1* 10.5*  HCT 36.3* 38.5* 36.2* 33.0*  MCV  --  111.9* 111.7* 107.8*  PLT  --  165 146* 145*    Basic Metabolic Panel: Recent Labs  Lab 02/25/21 1614 02/25/21 1754 02/26/21 0319 02/27/21 0310  NA 136  --  136 134*  K 6.1*  --  5.2* 5.7*  CL 98  --  100 102  CO2 32  --  28 27  GLUCOSE 95  --  118* 113*  BUN 32*  --  33* 36*  CREATININE 3.62*  --  3.34* 3.01*  CALCIUM 9.7  --  9.2 8.9  MG  --  2.2  --  1.8    GFR: Estimated Creatinine Clearance: 21.9 mL/min (A) (by C-G formula based on SCr of 3.01 mg/dL (H)). Liver Function Tests: No results for input(s): AST, ALT, ALKPHOS, BILITOT, PROT, ALBUMIN in the last 168 hours. No results for input(s): LIPASE, AMYLASE in the last 168 hours. No results for input(s): AMMONIA in the last 168 hours. Coagulation Profile: No results for input(s): INR, PROTIME in the last 168 hours. Cardiac Enzymes: No results for input(s): CKTOTAL, CKMB, CKMBINDEX, TROPONINI in the last 168 hours. BNP (last 3 results) No results for input(s): PROBNP in the last 8760 hours. HbA1C: Recent Labs    02/26/21 1540  HGBA1C 4.9   CBG: No results for input(s): GLUCAP in the last 168 hours. Lipid  Profile: No results for input(s): CHOL, HDL, LDLCALC, TRIG, CHOLHDL, LDLDIRECT in the last 72 hours. Thyroid Function Tests: No results for input(s): TSH, T4TOTAL, FREET4, T3FREE, THYROIDAB in the last 72 hours. Anemia Panel: No results for input(s): VITAMINB12, FOLATE, FERRITIN, TIBC, IRON, RETICCTPCT in the last 72 hours. Sepsis Labs: No results for input(s): PROCALCITON, LATICACIDVEN in the last 168 hours.  Recent Results (from the past 240 hour(s))  Resp Panel by RT-PCR (Flu A&B, Covid) Nasopharyngeal Swab     Status: None   Collection Time: 02/25/21  9:42 PM   Specimen: Nasopharyngeal Swab; Nasopharyngeal(NP) swabs in vial transport medium  Result Value Ref Range Status   SARS Coronavirus 2 by RT PCR NEGATIVE  NEGATIVE Final    Comment: (NOTE) SARS-CoV-2 target nucleic acids are NOT DETECTED.  The SARS-CoV-2 RNA is generally detectable in upper respiratory specimens during the acute phase of infection. The lowest concentration of SARS-CoV-2 viral copies this assay can detect is 138 copies/mL. A negative result does not preclude SARS-Cov-2 infection and should not be used as the sole basis for treatment or other patient management decisions. A negative result may occur with  improper specimen collection/handling, submission of specimen other than nasopharyngeal swab, presence of viral mutation(s) within the areas targeted by this assay, and inadequate number of viral copies(<138 copies/mL). A negative result must be combined with clinical observations, patient history, and epidemiological information. The expected result is Negative.  Fact Sheet for Patients:  EntrepreneurPulse.com.au  Fact Sheet for Healthcare Providers:  IncredibleEmployment.be  This test is no t yet approved or cleared by the [place] FDA and  has been authorized for detection and/or diagnosis of SARS-CoV-2 by FDA under an Emergency Use Authorization (EUA). This EUA  will remain  in effect (meaning this test can be used) for the duration of the COVID-19 declaration under Section 564(b)(1) of the Act, 21 U.S.C.section 360bbb-3(b)(1), unless the authorization is terminated  or revoked sooner.       Influenza A by PCR NEGATIVE NEGATIVE Final   Influenza B by PCR NEGATIVE NEGATIVE Final    Comment: (NOTE) The Xpert Xpress SARS-CoV-2/FLU/RSV plus assay is intended as an aid in the diagnosis of influenza from Nasopharyngeal swab specimens and should not be used as a sole basis for treatment. Nasal washings and aspirates are unacceptable for Xpert Xpress SARS-CoV-2/FLU/RSV testing.  Fact Sheet for Patients: EntrepreneurPulse.com.au  Fact Sheet for Healthcare Providers: IncredibleEmployment.be  This test is not yet approved or cleared by the [place] FDA and has been authorized for detection and/or diagnosis of SARS-CoV-2 by FDA under an Emergency Use Authorization (EUA). This EUA will remain in effect (meaning this test can be used) for the duration of the COVID-19 declaration under Section 564(b)(1) of the Act, 21 U.S.C. section 360bbb-3(b)(1), unless the authorization is terminated or revoked.  Performed at [facility], [place] [address] [place], [place] [address]        Radiology Studies: CT Head Wo Contrast  Result Date: 02/25/2021 CLINICAL DATA:  Sudden onset of weakness today resulting in a fall. EXAM: CT HEAD WITHOUT CONTRAST TECHNIQUE: Contiguous axial images were obtained from the base of the skull through the vertex without intravenous contrast. COMPARISON:  Head CT 10/31/2020 and MRI 02/07/2018 FINDINGS: Brain: There is no evidence of an acute infarct, intracranial hemorrhage, mass, midline shift, or extra-axial fluid collection. Hypodensities in the cerebral white matter bilaterally are unchanged and nonspecific but compatible with mild chronic small vessel ischemic disease. Mild cerebral  atrophy is unchanged. Vascular: No hyperdense vessel. Skull: No fracture or suspicious osseous lesion. Sinuses/Orbits: Minimal bilateral ethmoid sinus and left maxillary sinus mucosal thickening. Clear mastoid air cells. Bilateral cataract extraction. Other: None. IMPRESSION: 1. No evidence of acute intracranial abnormality. 2. Mild chronic small vessel ischemic disease. Electronically Signed   By: Logan Bores M.D.   On: 02/25/2021 17:37   DG Chest Portable 1 View  Result Date: 02/25/2021 CLINICAL DATA:  Generalized weakness. 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 Stable normal heart size. Unchanged mediastinal contours. No focal airspace disease, pleural effusion, or pneumothorax. No pulmonary edema. No acute osseous abnormalities are seen. IMPRESSION: No acute chest finding.  Electronically Signed   By: Keith Rake M.D.   On: 02/25/2021 21:56    Scheduled Meds:  apixaban  5 mg Oral BID   budesonide  9 mg Oral Daily   cholecalciferol  5,000 Units Oral Daily   insulin aspart  10 Units Intravenous Once   And   dextrose  1 ampule Intravenous Once   divalproex  1,500 mg Oral QHS   ferrous sulfate  325 mg Oral BID WC   isosorbide mononitrate  60 mg Oral Daily   ivabradine  5 mg Oral BID WC   metoprolol succinate  37.5 mg Oral Daily   mirtazapine  7.5 mg Oral QHS   multivitamin with minerals  1 tablet Oral Daily   pantoprazole  40 mg Oral Daily   QUEtiapine  150 mg Oral QHS   tamsulosin  0.4 mg Oral Daily   Continuous Infusions:  sodium chloride 100 mL/hr at 02/27/21 0543     LOS: 2 days   Time spent: 28 minutes   Darliss Cheney, MD [facility]  02/27/2021, 8:06 AM  Please page via [place] and do not message via secure chat for anything urgent. Secure chat can be used for anything non urgent.  How to contact the [facility] Attending or Consulting provider [place] or covering provider during after hours [place], for this  patient?  Check the care team in [facility] and look for a) attending/consulting TRH provider listed and b) the [facility] team listed. Page or secure chat [phone]. Log into [email or web address] and use [place]'s universal password to access. If you do not have the password, please contact the hospital operator. Locate the [facility] provider you are looking for under [facility] and page to a number that you can be directly reached. If you still have difficulty reaching the provider, please page the [facility] (Director on Call) for the Hospitalists listed on amion for assistance.

## 2021-02-27 NOTE — TOC Progression Note (Signed)
Transition of Care [facility]) - Progression Note    Patient Details  Name: KHOURY SIEMON MRN: [id number] Date of Birth: February 02, 1946  [facility] CM/SW Contact  Tom-Johnson, Renea Ee, RN Phone Number: 02/27/2021, 4:42 PM  Clinical Narrative:    CM notified by patient's caregiver, Lake Bells that patient would like to discharge home with home health. CM spoke with patient at bedside and requested going to caregiver's home with home health PT/OT and then transition to his home when ready. Patient states he had used both [place] and [place] services but prefers to use [place] at this time. CM notified Tommi Rumps with [facility] and he accepted referral. CM will continue to follow with needs.     Expected Discharge Plan: [place] Barriers to Discharge: Continued Medical Work up  Expected Discharge Plan and Services Expected Discharge Plan: [place]   Discharge Planning Services: CM Consult Post Acute Care Choice: [place] arrangements for the past 2 months: Apartment                 DME Arranged: N/A DME Agency: NA       HH Arranged: PT, OT, Social Work [place] Agency: [place] Date [place] Agency Contacted: 02/27/21 Time [place]: 1630 Representative spoke with at [place]: [place] ([place]) Interventions    Readmission Risk Interventions No flowsheet data found.

## 2021-02-27 NOTE — Evaluation (Addendum)
Physical Therapy Evaluation Patient Details Name: Brent [name] MRN: [id number] DOB: 12/29/1945 Today's Date: 02/27/2021  History of Present Illness  Pt is a 76 y.o. M who presents 02/25/2021 with generalized weakness, AKI, hyperkalemia. Significant PMH: CKD 3B, HTN, COPD, HFrEF, celiac disease, BPH, gout, ICD.  Clinical Impression  PTA, pt lives alone and reports he is independent with mobility, ADL's, and IADL's, including driving. Pt presents with decreased functional mobility secondary to weakness, gait abnormalities, decreased gait speed, and impaired standing balance. Pt requiring min assist for transfers and ambulating 15 feet with a walker at a min guard assist level. Pt deferring further distance due to wanting to eat lunch. Pt demonstrates shuffling gait pattern with decreased stride length and foot clearance with slippers donned. Pt presenting as a high fall risk based on these deficits and history of frequent falling. Due to this and decreased caregiver support, recommend ST SNF.      Recommendations for follow up therapy are one component of a multi-disciplinary discharge planning process, led by the attending physician.  Recommendations may be updated based on patient status, additional functional criteria and insurance authorization.  Follow Up Recommendations Skilled nursing-short term rehab (<3 hours/day) (if refuses recommend HHPT)    Assistance Recommended at Discharge Frequent or constant Supervision/Assistance  Patient can return home with the following  A little help with walking and/or transfers;Assistance with cooking/housework    Equipment Recommendations None recommended by PT  Recommendations for Other Services       Functional Status Assessment Patient has had a recent decline in their functional status and demonstrates the ability to make significant improvements in function in a reasonable and predictable amount of time.     Precautions / Restrictions  Precautions Precautions: Fall Restrictions Weight Bearing Restrictions: No      Mobility  Bed Mobility Overal bed mobility: Needs Assistance Bed Mobility: Supine to Sit     Supine to sit: Supervision     General bed mobility comments: Increased time, cues to initiate    Transfers Overall transfer level: Needs assistance Equipment used: Rolling walker (2 wheels) Transfers: Sit to/from Stand Sit to Stand: Min assist           General transfer comment: Use of momentum, requires minA to stand from edge of bed    Ambulation/Gait Ambulation/Gait assistance: Min guard Gait Distance (Feet): 15 Feet Assistive device: Rolling walker (2 wheels) Gait Pattern/deviations: Step-through pattern;Decreased stride length;Shuffle Gait velocity: decreased Gait velocity interpretation: <1.8 ft/sec, indicate of risk for recurrent falls   General Gait Details: Shuffling type gait pattern, decreased bilateral foot clearance, min guard for safety.  Stairs            Wheelchair Mobility    Modified Rankin (Stroke Patients Only)       Balance Overall balance assessment: Needs assistance Sitting-balance support: Feet supported Sitting balance-Leahy Scale: Good     Standing balance support: Bilateral upper extremity supported Standing balance-Leahy Scale: Poor Standing balance comment: reliant on RW                             Pertinent Vitals/Pain Pain Assessment: No/denies pain    Home Living Family/patient expects to be discharged to:: Private residence Living Arrangements: Alone Available Help at Discharge: Friend(s);Available PRN/intermittently;Neighbor;Personal care attendant Type of Home: Apartment Home Access: Level entry       Home Layout: One level Home Equipment: Cane - single Barista (2 wheels);Rollator (4 wheels);Tub  bench      Prior Function Prior Level of Function : Independent/Modified Independent              Mobility Comments: pt reports no AD, using "cart" in grocery store ADLs Comments: independent per pt     Hand Dominance   Dominant Hand: Right    Extremity/Trunk Assessment   Upper Extremity Assessment Upper Extremity Assessment: RUE deficits/detail;LUE deficits/detail RUE Deficits / Details: AROM shoulder ~100 degrees LUE Deficits / Details: AROM shoulder ~120 degrees    Lower Extremity Assessment Lower Extremity Assessment: RLE deficits/detail;LLE deficits/detail RLE Deficits / Details: Grossly 4/5 LLE Deficits / Details: Grossly 4/5       Communication   Communication: No difficulties  Cognition Arousal/Alertness: Awake/alert Behavior During Therapy: WFL for tasks assessed/performed Overall Cognitive Status: No family/caregiver present to determine baseline cognitive functioning                                 General Comments: Decreased insight into deficits        General Comments      Exercises     Assessment/Plan    PT Assessment Patient needs continued PT services  PT Problem List Decreased strength;Decreased activity tolerance;Decreased balance;Decreased mobility       PT Treatment Interventions DME instruction;Stair training;Gait training;Therapeutic activities;Functional mobility training;Therapeutic exercise;Balance training;Patient/family education    PT Goals (Current goals can be found in the Care Plan section)  Acute Rehab PT Goals Patient Stated Goal: go home PT Goal Formulation: With patient Time For Goal Achievement: 03/13/21 Potential to Achieve Goals: Good    Frequency Min 3X/week     Co-evaluation               AM-PAC PT "6 Clicks" Mobility  Outcome Measure Help needed turning from your back to your side while in a flat bed without using bedrails?: A Little Help needed moving from lying on your back to sitting on the side of a flat bed without using bedrails?: A Little Help needed moving to and from a bed to  a chair (including a wheelchair)?: A Little Help needed standing up from a chair using your arms (e.g., wheelchair or bedside chair)?: A Little Help needed to walk in hospital room?: A Little Help needed climbing 3-5 steps with a railing? : Total 6 Click Score: 16    End of Session Equipment Utilized During Treatment: Gait belt Activity Tolerance: Patient tolerated treatment well Patient left: in chair;with call bell/phone within reach;with chair alarm set Nurse Communication: Mobility status PT Visit Diagnosis: Unsteadiness on feet (R26.81);Other abnormalities of gait and mobility (R26.89);Muscle weakness (generalized) (M62.81);History of falling (Z91.81);Difficulty in walking, not elsewhere classified (R26.2)    Time: 3825-0539 PT Time Calculation (min) (ACUTE ONLY): 20 min   Charges:   PT Evaluation $PT Eval Moderate Complexity: 1 Mod          Wyona Almas, PT, DPT Acute Rehabilitation Services Pager [phone] Office [phone]   Deno Etienne 02/27/2021, 2:07 PM

## 2021-02-27 NOTE — Discharge Instructions (Signed)

## 2021-02-28 DIAGNOSIS — N179 Acute kidney failure, unspecified: Secondary | ICD-10-CM | POA: Diagnosis not present

## 2021-02-28 DIAGNOSIS — N189 Chronic kidney disease, unspecified: Secondary | ICD-10-CM | POA: Diagnosis not present

## 2021-02-28 LAB — CBC
HCT: 31.2 % — ABNORMAL LOW (ref 39.0–52.0)
Hemoglobin: 9.8 g/dL — ABNORMAL LOW (ref 13.0–17.0)
MCH: 34.5 pg — ABNORMAL HIGH (ref 26.0–34.0)
MCHC: 31.4 g/dL (ref 30.0–36.0)
MCV: 109.9 fL — ABNORMAL HIGH (ref 80.0–100.0)
Platelets: 155 10*3/uL (ref 150–400)
RBC: 2.84 MIL/uL — ABNORMAL LOW (ref 4.22–5.81)
RDW: 13.9 % (ref 11.5–15.5)
WBC: 9.7 10*3/uL (ref 4.0–10.5)
nRBC: 0 % (ref 0.0–0.2)

## 2021-02-28 LAB — MAGNESIUM: Magnesium: 1.7 mg/dL (ref 1.7–2.4)

## 2021-02-28 LAB — BASIC METABOLIC PANEL
Anion gap: 4 — ABNORMAL LOW (ref 5–15)
BUN: 35 mg/dL — ABNORMAL HIGH (ref 8–23)
CO2: 23 mmol/L (ref 22–32)
Calcium: 8.6 mg/dL — ABNORMAL LOW (ref 8.9–10.3)
Chloride: 111 mmol/L (ref 98–111)
Creatinine, Ser: 2.81 mg/dL — ABNORMAL HIGH (ref 0.61–1.24)
GFR, Estimated: 23 mL/min — ABNORMAL LOW (ref >60)
Glucose, Bld: 97 mg/dL (ref 70–99)
Potassium: 5.5 mmol/L — ABNORMAL HIGH (ref 3.5–5.1)
Sodium: 138 mmol/L (ref 135–145)

## 2021-02-28 LAB — FOLATE: Folate: 18.5 ng/mL (ref >5.9)

## 2021-02-28 LAB — VITAMIN B12: Vitamin B-12: 563 pg/mL (ref 180–914)

## 2021-02-28 LAB — POTASSIUM: Potassium: 4.9 mmol/L (ref 3.5–5.1)

## 2021-02-28 MED ORDER — SODIUM ZIRCONIUM CYCLOSILICATE 10 G PO PACK
10.0000 g | PACK | Freq: Once | ORAL | Status: AC
  Administered 2021-02-28: 10 g via ORAL
  Filled 2021-02-28: qty 1

## 2021-02-28 NOTE — Plan of Care (Signed)
  Problem: Nutrition: Goal: Adequate nutrition will be maintained Outcome: Adequate for Discharge   

## 2021-02-28 NOTE — Progress Notes (Addendum)
PROGRESS NOTE    Brent [name]  OMV:[id number] DOB: [birth year] DOA: 02/25/2021 PCP: Sandrea Hughs, NP   Brief Narrative:  Brent [name] is a 76 y.o. male with medical history significant for CKD 3B, HTN, COPD, HFrEF, celiac disease, BPH, gout who presented for evaluation of generalized weakness for 4 to 5 days. No loss of consciousness or any falls.  He does have an AICD in place. Has a history of smoking but quit over 20 years ago.  Denies alcohol or illicit drug use.  Upon arrival to the ED, he was hemodynamically stable with hyperkalemia 6.1 and creatinine of 3.62 which is elevated from his baseline of 2.15.  He was given Lokelma and calcium gluconate in the ED and admitted to hospital service.  Assessment & Plan:   Principal Problem:   Acute kidney injury superimposed on chronic kidney disease ([place]) Active Problems:   Essential hypertension, benign   Chronic obstructive pulmonary disease (HCC)   Chronic systolic CHF (congestive heart failure) (HCC)   Type 2 diabetes mellitus with stage 3 chronic kidney disease (HCC)   ICD (implantable cardioverter-defibrillator) in place   Generalized weakness  Acute kidney injury superimposed on chronic kidney disease 3B: Baseline creatinine around 2.15, presented with 3.62, slowly improving and down to 2.8 today.  Continue IV fluids.  Continue to avoid nephrotoxic agents.   Hyperkalemia: 5.5 again today.  We will try Lokelma.   Chronic systolic congestive heart failure: Appears dehydrated. Continue metoprolol, hold Lasix.Also hold Farxiga due to hyperkalemia.   Generalized weakness: PT OT recommended SNF.  He was initially declining that but now he is being to go to SNF.  TOC informed.   Essential hypertension, benign: Diastolic slightly elevated. Continue Toprol-XL, Imdur.   Chronic obstructive pulmonary disease  Stable.  DuoNebs every 4 hours as needed   ICD (implantable cardioverter-defibrillator) in place Stable  Chronic macrocytic  anemia: Hemoglobin stable.  We will check B12 and folate.  BPH: Flomax.  DVT prophylaxis:    Code Status: Full Code  Family Communication:  None present at bedside.  Plan of care discussed with patient in length and he verbalized understanding and agreed with it.  Status is: Inpatient  Remains inpatient appropriate because: Still needs ivf  Estimated body mass index is 20.09 kg/m as calculated from the following:   Height as of this encounter: 6' 3"  (1.905 m).   Weight as of this encounter: 72.9 kg.  Nutritional Assessment: Body mass index is 20.09 kg/m.Marland Kitchen Seen by dietician.  I agree with the assessment and plan as outlined below: Nutrition Status:  Skin Assessment: I have examined the patient's skin and I agree with the wound assessment as performed by the wound care RN as outlined below:    Consultants:  None  Procedures:  None  Antimicrobials:  Anti-infectives (From admission, onward)    None          Subjective:  Seen and examined.  Caretaker at the bedside.  Patient has no complaints.  He is ready to go to SNF.  Objective: Vitals:   02/27/21 2114 02/27/21 2118 02/28/21 0638 02/28/21 0905  BP: 136/79 136/79 136/80 133/84  Pulse:  86 83 89  Resp:  18 20 18   Temp: 98.4 F (36.9 C) 98.4 F (36.9 C) 97.6 F (36.4 C) 98.1 F (36.7 C)  TempSrc: Oral Oral Oral   SpO2:  97% 94% 94%  Weight:      Height:        Intake/Output Summary (  Last 24 hours) at 02/28/2021 1302 Last data filed at 02/28/2021 0900 Gross per 24 hour  Intake 2680.11 ml  Output 2075 ml  Net 605.11 ml    Filed Weights   02/26/21 1144 02/26/21 1600  Weight: 73.3 kg 72.9 kg    Examination:  General exam: Appears calm and comfortable  Respiratory system: Clear to auscultation. Respiratory effort normal. Cardiovascular system: S1 & S2 heard, RRR. No JVD, murmurs, rubs, gallops or clicks. No pedal edema. Gastrointestinal system: Abdomen is nondistended, soft and nontender. No  organomegaly or masses felt. Normal bowel sounds heard. Central nervous system: Alert and oriented. No focal neurological deficits. Extremities: Symmetric 5 x 5 power. Skin: No rashes, lesions or ulcers.  Psychiatry: Judgement and insight appear normal. Mood & affect appropriate.   Data Reviewed: I have personally reviewed following labs and imaging studies  CBC: Recent Labs  Lab 02/25/21 1614 02/26/21 0319 02/27/21 0310 02/28/21 0255  WBC 13.3* 10.7* 8.6 9.7  NEUTROABS  --   --  5.9  --   HGB 11.8* 11.1* 10.5* 9.8*  HCT 38.5* 36.2* 33.0* 31.2*  MCV 111.9* 111.7* 107.8* 109.9*  PLT 165 146* 145* 245    Basic Metabolic Panel: Recent Labs  Lab 02/25/21 1614 02/25/21 1754 02/26/21 0319 02/27/21 0310 02/27/21 1353 02/28/21 0255  NA 136  --  136 134*  --  138  K 6.1*  --  5.2* 5.7* 4.9 5.5*  CL 98  --  100 102  --  111  CO2 32  --  28 27  --  23  GLUCOSE 95  --  118* 113*  --  97  BUN 32*  --  33* 36*  --  35*  CREATININE 3.62*  --  3.34* 3.01*  --  2.81*  CALCIUM 9.7  --  9.2 8.9  --  8.6*  MG  --  2.2  --  1.8  --  1.7    GFR: Estimated Creatinine Clearance: 23.4 mL/min (A) (by C-G formula based on SCr of 2.81 mg/dL (H)). Liver Function Tests: No results for input(s): AST, ALT, ALKPHOS, BILITOT, PROT, ALBUMIN in the last 168 hours. No results for input(s): LIPASE, AMYLASE in the last 168 hours. No results for input(s): AMMONIA in the last 168 hours. Coagulation Profile: No results for input(s): INR, PROTIME in the last 168 hours. Cardiac Enzymes: No results for input(s): CKTOTAL, CKMB, CKMBINDEX, TROPONINI in the last 168 hours. BNP (last 3 results) No results for input(s): PROBNP in the last 8760 hours. HbA1C: Recent Labs    02/26/21 1540  HGBA1C 4.9    CBG: Recent Labs  Lab 02/27/21 1013  GLUCAP 83   Lipid Profile: No results for input(s): CHOL, HDL, LDLCALC, TRIG, CHOLHDL, LDLDIRECT in the last 72 hours. Thyroid Function Tests: No results for  input(s): TSH, T4TOTAL, FREET4, T3FREE, THYROIDAB in the last 72 hours. Anemia Panel: No results for input(s): VITAMINB12, FOLATE, FERRITIN, TIBC, IRON, RETICCTPCT in the last 72 hours. Sepsis Labs: No results for input(s): PROCALCITON, LATICACIDVEN in the last 168 hours.  Recent Results (from the past 240 hour(s))  Resp Panel by RT-PCR (Flu A&B, Covid) Nasopharyngeal Swab     Status: None   Collection Time: 02/25/21  9:42 PM   Specimen: Nasopharyngeal Swab; Nasopharyngeal(NP) swabs in vial transport medium  Result Value Ref Range Status   SARS Coronavirus 2 by RT PCR NEGATIVE NEGATIVE Final    Comment: (NOTE) SARS-CoV-2 target nucleic acids are NOT DETECTED.  The SARS-CoV-2 RNA  is generally detectable in upper respiratory specimens during the acute phase of infection. The lowest concentration of SARS-CoV-2 viral copies this assay can detect is 138 copies/mL. A negative result does not preclude SARS-Cov-2 infection and should not be used as the sole basis for treatment or other patient management decisions. A negative result may occur with  improper specimen collection/handling, submission of specimen other than nasopharyngeal swab, presence of viral mutation(s) within the areas targeted by this assay, and inadequate number of viral copies(<138 copies/mL). A negative result must be combined with clinical observations, patient history, and epidemiological information. The expected result is Negative.  Fact Sheet for Patients:  EntrepreneurPulse.com.au  Fact Sheet for Healthcare Providers:  IncredibleEmployment.be  This test is no t yet approved or cleared by the [place] FDA and  has been authorized for detection and/or diagnosis of SARS-CoV-2 by FDA under an Emergency Use Authorization (EUA). This EUA will remain  in effect (meaning this test can be used) for the duration of the COVID-19 declaration under Section 564(b)(1) of the Act,  21 U.S.C.section 360bbb-3(b)(1), unless the authorization is terminated  or revoked sooner.       Influenza A by PCR NEGATIVE NEGATIVE Final   Influenza B by PCR NEGATIVE NEGATIVE Final    Comment: (NOTE) The Xpert Xpress SARS-CoV-2/FLU/RSV plus assay is intended as an aid in the diagnosis of influenza from Nasopharyngeal swab specimens and should not be used as a sole basis for treatment. Nasal washings and aspirates are unacceptable for Xpert Xpress SARS-CoV-2/FLU/RSV testing.  Fact Sheet for Patients: EntrepreneurPulse.com.au  Fact Sheet for Healthcare Providers: IncredibleEmployment.be  This test is not yet approved or cleared by the [place] FDA and has been authorized for detection and/or diagnosis of SARS-CoV-2 by FDA under an Emergency Use Authorization (EUA). This EUA will remain in effect (meaning this test can be used) for the duration of the COVID-19 declaration under Section 564(b)(1) of the Act, 21 U.S.C. section 360bbb-3(b)(1), unless the authorization is terminated or revoked.  Performed at [facility], [place] [address] [place], [place] [address]        Radiology Studies: No results found.  Scheduled Meds:  apixaban  5 mg Oral BID   budesonide  9 mg Oral Daily   cholecalciferol  5,000 Units Oral Daily   divalproex  1,500 mg Oral QHS   ferrous sulfate  325 mg Oral BID WC   isosorbide mononitrate  60 mg Oral Daily   ivabradine  5 mg Oral BID WC   metoprolol succinate  37.5 mg Oral Daily   mirtazapine  7.5 mg Oral QHS   multivitamin with minerals  1 tablet Oral Daily   pantoprazole  40 mg Oral Daily   QUEtiapine  150 mg Oral QHS   tamsulosin  0.4 mg Oral Daily   Continuous Infusions:  sodium chloride 100 mL/hr at 02/28/21 1207     LOS: 3 days   Time spent: 25 minutes   Darliss Cheney, MD [facility]  02/28/2021, 1:02 PM  Please page via Shea Evans and do not message via secure chat for anything  urgent. Secure chat can be used for anything non urgent.  How to contact the [facility] Attending or Consulting provider [place] or covering provider during after hours [place], for this patient?  Check the care team in [facility] and look for a) attending/consulting TRH provider listed and b) the [facility] team listed. Page or secure chat [phone]. Log into [email or web address] and use 's  universal password to access. If you do not have the password, please contact the hospital operator. Locate the [facility] provider you are looking for under [facility] and page to a number that you can be directly reached. If you still have difficulty reaching the provider, please page the [facility] (Director on Call) for the Hospitalists listed on amion for assistance.

## 2021-02-28 NOTE — Evaluation (Signed)
Occupational Therapy Evaluation Patient Details Name: Brent [name] MRN: [id number] DOB: 04-28-1945 Today's Date: 02/28/2021   History of Present Illness Pt is a 77 y.o. M who presents 02/25/2021 with generalized weakness, AKI, hyperkalemia. Significant PMH: CKD 3B, HTN, COPD, HFrEF, celiac disease, BPH, gout, ICD.   Clinical Impression   Pt presents with decline in function and safety with ADLs and ADL mobility with impaired strength, balance and endurance. PTA pt lived at home alone and was Ind with ADLs, IADLs, was driving and did not use an AD or mobility (pt's caregiver states that he has cane and RW but won't use them). Pt currently requires set up/sup with UB ADLs seated, max A with LB ADLs, mod A with toileting and min A with mobility using RW. Pt would benefit from acute OT services to address impairments to maximize level of function and safety     Recommendations for follow up therapy are one component of a multi-disciplinary discharge planning process, led by the attending physician.  Recommendations may be updated based on patient status, additional functional criteria and insurance authorization.   Follow Up Recommendations  Skilled nursing-short term rehab (<3 hours/day)    Assistance Recommended at Discharge Frequent or constant Supervision/Assistance  Patient can return home with the following A little help with walking and/or transfers;A lot of help with bathing/dressing/bathroom    Functional Status Assessment  Patient has had a recent decline in their functional status and demonstrates the ability to make significant improvements in function in a reasonable and predictable amount of time.  Equipment Recommendations  Tub/shower seat;BSC/3in1    Recommendations for Other Services       Precautions / Restrictions Precautions Precautions: Fall Restrictions Weight Bearing Restrictions: No      Mobility Bed Mobility Overal bed mobility: Needs Assistance Bed Mobility:  Supine to Sit     Supine to sit: Supervision     General bed mobility comments: Increased time, cues to initiate    Transfers Overall transfer level: Needs assistance Equipment used: Rolling walker (2 wheels) Transfers: Sit to/from Stand Sit to Stand: Min assist           General transfer comment: Use of momentum, requires minA to stand from edge of bed      Balance Overall balance assessment: Needs assistance Sitting-balance support: Feet supported;No upper extremity supported Sitting balance-Leahy Scale: Good     Standing balance support: Bilateral upper extremity supported;During functional activity Standing balance-Leahy Scale: Poor                             ADL either performed or assessed with clinical judgement   ADL Overall ADL's : Needs assistance/impaired Eating/Feeding: Set up;Independent;Sitting   Grooming: Wash/dry hands;Wash/dry face;Minimal assistance;Standing   Upper Body Bathing: Set up;Supervision/ safety;Sitting   Lower Body Bathing: Maximal assistance   Upper Body Dressing : Set up;Supervision/safety;Sitting   Lower Body Dressing: Maximal assistance   Toilet Transfer: Minimal assistance;Ambulation;Rolling walker (2 wheels);Cueing for safety   Toileting- Clothing Manipulation and Hygiene: Moderate assistance;Sit to/from stand       Functional mobility during ADLs: Minimal assistance;Cueing for safety;Rolling walker (2 wheels)       Vision Baseline Vision/History: 1 Wears glasses Ability to See in Adequate Light: 0 Adequate Patient Visual Report: No change from baseline       Perception     Praxis      Pertinent Vitals/Pain Pain Assessment: No/denies pain  Hand Dominance Right   Extremity/Trunk Assessment Upper Extremity Assessment Upper Extremity Assessment: Generalized weakness           Communication Communication Communication: No difficulties   Cognition Arousal/Alertness: Awake/alert Behavior  During Therapy: WFL for tasks assessed/performed Overall Cognitive Status: Within Functional Limits for tasks assessed                                 General Comments: Decreased insight into deficits     General Comments       Exercises     Shoulder Instructions      Home Living Family/patient expects to be discharged to:: Private residence Living Arrangements: Alone Available Help at Discharge: Friend(s);Available PRN/intermittently;Neighbor;Personal care attendant Type of Home: Apartment Home Access: Level entry     Home Layout: One level     Bathroom Shower/Tub: Teacher, early years/pre: Handicapped height     Home Equipment: [place] - single Barista (2 wheels);Rollator (4 wheels);Tub bench          Prior Functioning/Environment Prior Level of Function : Independent/Modified Independent             Mobility Comments: pt reports no AD, using "cart" in grocery store. Pt's caregiver reports that pt has a cane and RW and should be using byut he doesn't ADLs Comments: independent per pt        OT Problem List: Decreased strength;Impaired balance (sitting and/or standing);Decreased safety awareness;Decreased activity tolerance;Decreased knowledge of use of DME or AE      OT Treatment/Interventions: Self-care/ADL training;Patient/family education;Balance training;Therapeutic activities;DME and/or AE instruction    OT Goals(Current goals can be found in the care plan section) Acute Rehab OT Goals Patient Stated Goal: "I do need to go to rehab" OT Goal Formulation: With patient Time For Goal Achievement: 03/14/21 Potential to Achieve Goals: Good ADL Goals Pt Will Perform Grooming: with min guard assist;with supervision;standing Pt Will Perform Upper Body Bathing: with set-up;sitting Pt Will Perform Lower Body Bathing: with mod assist Pt Will Perform Upper Body Dressing: with set-up;sitting Pt Will Transfer to Toilet: with min  assist;with min guard assist;ambulating;regular height toilet;grab bars Pt Will Perform Toileting - Clothing Manipulation and hygiene: with min assist;with min guard assist;sit to/from stand  OT Frequency: Min 2X/week    Co-evaluation              AM-PAC OT "6 Clicks" Daily Activity     Outcome Measure Help from another person eating meals?: None Help from another person taking care of personal grooming?: A Little Help from another person toileting, which includes using toliet, bedpan, or urinal?: A Lot Help from another person bathing (including washing, rinsing, drying)?: A Lot Help from another person to put on and taking off regular upper body clothing?: A Little Help from another person to put on and taking off regular lower body clothing?: A Lot 6 Click Score: 16   End of Session Equipment Utilized During Treatment: Gait belt;Rolling walker (2 wheels)  Activity Tolerance: Patient tolerated treatment well Patient left: in chair;with call bell/phone within reach;with chair alarm set;with family/visitor present  OT Visit Diagnosis: Unsteadiness on feet (R26.81);Other abnormalities of gait and mobility (R26.89);History of falling (Z91.81);Muscle weakness (generalized) (M62.81)                Time: 5176-1607 OT Time Calculation (min): 25 min Charges:  OT General Charges $OT Visit: 1 Visit OT Evaluation $OT Eval  Moderate Complexity: 1 Mod OT Treatments $Therapeutic Activity: 8-22 mins    Britt Bottom 02/28/2021, 1:49 PM

## 2021-02-28 NOTE — TOC Progression Note (Signed)
Transition of Care [facility]) - Initial/Assessment Note    Patient Details  Name: Brent [name] MRN: [id number] Date of Birth: 12-01-1945  [facility] of [facility]) CM/SW Contact:    Milinda Antis, LCSWA Phone Number: 02/28/2021, 10:44 AM  Clinical Narrative:                 CSW notified by MD that the patient is now agreeable with receiving rehab at a SNF.  CSW met with the patient and patient's caregiver, Ms. [name], at bedside.  The patient was alert, oriented x 4, and working with PT.  The patient informed CSW that he now wants to go to a SNF.  [facility] is [facility].  The caregiver informed CSW that she is still willing to care for the patient at home, but patient would rather go to a facility.    Expected Discharge Plan: [place] Barriers to Discharge: Continued Medical Work up   Patient Goals and CMS Choice Patient states their goals for this hospitalization and ongoing recovery are:: To return home CMS Medicare.gov Compare Post Acute Care list provided to:: Patient Choice offered to / list presented to : Patient  Expected Discharge Plan and Services Expected Discharge Plan: [place]   Discharge Planning Services: CM Consult Post Acute Care Choice: [place] arrangements for the past 2 months: Apartment                 DME Arranged: N/A DME Agency: NA       HH Arranged: PT, OT, Social Work [place] Agency: [place] Date [facility] Agency Contacted: 02/27/21 Time [place]: 34 Representative spoke with at [place]: [place] Arrangements/Services Living arrangements for the past 2 months: [place] with:: Self Patient language and need for interpreter reviewed:: Yes Do you feel safe going back to the place where you live?: Yes      Need for Family Participation in Patient Care: Yes (Comment) Care giver support system in place?: Yes (comment)   Criminal Activity/Legal Involvement  Pertinent to Current Situation/Hospitalization: No - Comment as needed  Activities of Daily Living      Permission Sought/Granted Permission sought to share information with : Case Manager, Customer service manager, Family Supports Permission granted to share information with : Yes, Verbal Permission Granted              Emotional Assessment Appearance:: Appears stated age Attitude/Demeanor/Rapport: Engaged, Gracious Affect (typically observed): Accepting, Appropriate, Calm, Hopeful Orientation: : Oriented to Self, Oriented to Place, Oriented to  Time, Oriented to Situation Alcohol / Substance Use: Not Applicable Psych Involvement: No (comment)  Admission diagnosis:  Hyperkalemia [E87.5] Acute kidney injury superimposed on chronic kidney disease ([place]) [N17.9, N18.9] Patient Active Problem List   Diagnosis Date Noted   Acute kidney injury superimposed on chronic kidney disease ([place]) 02/25/2021   Generalized weakness 02/25/2021   SIRS (systemic inflammatory response syndrome) ([place]) 10/31/2020   Neurocognitive deficits 10/17/2020   Hypokalemia 10/08/2020   Hypomagnesemia 10/08/2020   CKD (chronic kidney disease), stage III ([place]) 10/08/2020   COVID-19 virus infection 10/08/2020   Electrolyte abnormality 05/17/2020   ICD (implantable cardioverter-defibrillator) in place 51/03/5850   Chronic systolic heart failure ([place]) 01/02/2020   Decreased appetite 10/08/2019   Weakness 10/08/2019   Hyperkalemia 77/82/4235   Chronic systolic CHF (congestive heart failure) ([place]) 08/26/2019   Type 2 diabetes mellitus with stage 3 chronic kidney disease ([place])  08/26/2019   Chronic obstructive pulmonary disease ([place]) 06/20/2019   Ventricular tachycardia 06/20/2019   Acute on chronic systolic (congestive) heart failure (HCC) 59/53/9672   Metabolic acidosis 89/79/1504   Orthostasis 09/07/2018   AKI (acute kidney injury) ([place])    Immunosuppressed status (HCC)    Macrocytic anemia     Chronic combined systolic and diastolic congestive heart failure ([place]) 06/03/2018   Candida esophagitis ([place]) 09/22/2017   History of smoking 30 or more pack years 01/05/2017   Bipolar 1 disorder ([place])    BPH (benign prostatic hyperplasia) 03/31/2013   Anemia in chronic kidney disease 03/31/2013   Essential hypertension, benign 03/31/2013   Acute renal failure superimposed on stage 3 chronic kidney disease ([place]) 06/04/2012   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[place]) 01/23/2010   PCP:  Sandrea Hughs, NP Pharmacy:   [facility] [phone] [address] [place], [place] [address] Phone: [phone] Fax: [phone]  [id number] - [employer], [place] [address] [place] [address] Phone: [phone] Fax: [phone]     Social Determinants of Health (SDOH) Interventions    Readmission Risk Interventions No flowsheet data found.

## 2021-02-28 NOTE — TOC Progression Note (Signed)
Transition of Care [facility]) - Initial/Assessment Note    Patient Details  Name: Brent [name] MRN: [id number] Date of Birth: [birth year]  Transition of Care [facility]) CM/SW Contact:    Milinda Antis, [place] Phone Number: 02/28/2021, 12:45 PM  Clinical Narrative:                 RE: Brent [name] Date of Birth: 1945-10-26 Date: 02/28/2021  Please be advised that the above-named patient will require a short-term nursing home stay - anticipated 30 days or less for rehabilitation and strengthening.  The plan is for return home.   Expected Discharge Plan: [place] Barriers to Discharge: Continued Medical Work up   Patient Goals and CMS Choice Patient states their goals for this hospitalization and ongoing recovery are:: To return home CMS Medicare.gov Compare Post Acute Care list provided to:: Patient Choice offered to / list presented to : Patient  Expected Discharge Plan and Services Expected Discharge Plan: [place]   Discharge Planning Services: CM Consult Post Acute Care Choice: [place] arrangements for the past 2 months: Apartment                 DME Arranged: N/A DME Agency: NA       HH Arranged: PT, OT, Social Work [place] Agency: [place] Date [facility] Agency Contacted: 02/27/21 Time [place]: 54 Representative spoke with at [place]: [place] Arrangements/Services Living arrangements for the past 2 months: [place] with:: Self Patient language and need for interpreter reviewed:: Yes Do you feel safe going back to the place where you live?: Yes      Need for Family Participation in Patient Care: Yes (Comment) Care giver support system in place?: Yes (comment)   Criminal Activity/Legal Involvement Pertinent to Current Situation/Hospitalization: No - Comment as needed  Activities of Daily Living      Permission Sought/Granted Permission sought to share information with : Case Manager,  Customer service manager, Family Supports Permission granted to share information with : Yes, Verbal Permission Granted              Emotional Assessment Appearance:: Appears stated age Attitude/Demeanor/Rapport: Engaged, Gracious Affect (typically observed): Accepting, Appropriate, Calm, Hopeful Orientation: : Oriented to Self, Oriented to Place, Oriented to  Time, Oriented to Situation Alcohol / Substance Use: Not Applicable Psych Involvement: No (comment)  Admission diagnosis:  Hyperkalemia [E87.5] Acute kidney injury superimposed on chronic kidney disease ([place]) [N17.9, N18.9] Patient Active Problem List   Diagnosis Date Noted   Acute kidney injury superimposed on chronic kidney disease ([place]) 02/25/2021   Generalized weakness 02/25/2021   SIRS (systemic inflammatory response syndrome) ([place]) 10/31/2020   Neurocognitive deficits 10/17/2020   Hypokalemia 10/08/2020   Hypomagnesemia 10/08/2020   CKD (chronic kidney disease), stage III ([place]) 10/08/2020   COVID-19 virus infection 10/08/2020   Electrolyte abnormality 05/17/2020   ICD (implantable cardioverter-defibrillator) in place 83/33/8329   Chronic systolic heart failure ([place]) 01/02/2020   Decreased appetite 10/08/2019   Weakness 10/08/2019   Hyperkalemia 19/16/6060   Chronic systolic CHF (congestive heart failure) ([place]) 08/26/2019   Type 2 diabetes mellitus with stage 3 chronic kidney disease ([place]) 08/26/2019   Chronic obstructive pulmonary disease ([place]) 06/20/2019   Ventricular tachycardia 06/20/2019   Acute on chronic systolic (congestive) heart failure ([place]) 04/59/9774   Metabolic acidosis 14/23/9532   Orthostasis 09/07/2018   AKI (acute kidney injury) ([place])    Immunosuppressed status ([place])  Macrocytic anemia    Chronic combined systolic and diastolic congestive heart failure ([place]) 06/03/2018   Candida esophagitis ([place]) 09/22/2017   History of smoking 30 or more pack years 01/05/2017   Bipolar 1 disorder  ([place])    BPH (benign prostatic hyperplasia) 03/31/2013   Anemia in chronic kidney disease 03/31/2013   Essential hypertension, benign 03/31/2013   Acute renal failure superimposed on stage 3 chronic kidney disease ([place]) 06/04/2012   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[place]) 01/23/2010   PCP:  Sandrea Hughs, NP Pharmacy:   [facility] [phone] [place], [place] - [place] [address] Phone: [phone] Fax: [phone]  [id number] - [place], [place] - [address] [place] [address] Phone: [phone] Fax: [phone]     Social Determinants of Health (SDOH) Interventions    Readmission Risk Interventions No flowsheet data found.

## 2021-02-28 NOTE — NC FL2 (Signed)
[place] LEVEL OF CARE SCREENING TOOL     IDENTIFICATION  Patient Name: Brent [name] Birthdate: [birth year] Sex: male Admission Date (Current Location): 02/25/2021  [facility] and [place] Number:  Herbalist and Address:  The [place], [place] [address] [place], [place] [address]      Provider Number: [id number]  Attending Physician Name and Address:  Darliss Cheney, MD  Relative Name and Phone Number:  Assunta Curtis (Sister)   [phone]    Current Level of Care: Hospital Recommended Level of Care: [place] Prior Approval Number:    Date Approved/Denied:   PASRR Number: pending  Discharge Plan: SNF    Current Diagnoses: Patient Active Problem List   Diagnosis Date Noted   Acute kidney injury superimposed on chronic kidney disease ([place]) 02/25/2021   Generalized weakness 02/25/2021   SIRS (systemic inflammatory response syndrome) ([place]) 10/31/2020   Neurocognitive deficits 10/17/2020   Hypokalemia 10/08/2020   Hypomagnesemia 10/08/2020   CKD (chronic kidney disease), stage III ([place]) 10/08/2020   COVID-19 virus infection 10/08/2020   Electrolyte abnormality 05/17/2020   ICD (implantable cardioverter-defibrillator) in place 67/01/4579   Chronic systolic heart failure ([place]) 01/02/2020   Decreased appetite 10/08/2019   Weakness 10/08/2019   Hyperkalemia 99/83/3825   Chronic systolic CHF (congestive heart failure) ([place]) 08/26/2019   Type 2 diabetes mellitus with stage 3 chronic kidney disease ([place]) 08/26/2019   Chronic obstructive pulmonary disease ([place]) 06/20/2019   Ventricular tachycardia 06/20/2019   Acute on chronic systolic (congestive) heart failure ([place]) 05/39/7673   Metabolic acidosis 41/93/7902   Orthostasis 09/07/2018   AKI (acute kidney injury) ([place])    Immunosuppressed status (HCC)    Macrocytic anemia    Chronic combined systolic and diastolic congestive heart failure ([place]) 06/03/2018   Candida  esophagitis ([place]) 09/22/2017   History of smoking 30 or more pack years 01/05/2017   Bipolar 1 disorder (HCC)    BPH (benign prostatic hyperplasia) 03/31/2013   Anemia in chronic kidney disease 03/31/2013   Essential hypertension, benign 03/31/2013   Acute renal failure superimposed on stage 3 chronic kidney disease ([place]) 06/04/2012   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[place]) 01/23/2010    Orientation RESPIRATION BLADDER Height & Weight     Self, Time, Situation, Place  Normal Continent, External catheter Weight: 160 lb 11.5 oz (72.9 kg) Height:  6' 3"  (190.5 cm)  BEHAVIORAL SYMPTOMS/MOOD NEUROLOGICAL BOWEL NUTRITION STATUS      Continent Diet (see d/c summary)  AMBULATORY STATUS COMMUNICATION OF NEEDS Skin   Limited Assist Verbally Normal                       Personal Care Assistance Level of Assistance  Bathing, Feeding, Dressing Bathing Assistance: Limited assistance Feeding assistance: Independent Dressing Assistance: Limited assistance     Functional Limitations Info  Sight, Speech, Hearing Sight Info: Adequate Hearing Info: Adequate Speech Info: Adequate    SPECIAL CARE FACTORS FREQUENCY  PT (By licensed PT), OT (By licensed OT)     PT Frequency: 5x/ week OT Frequency: 5x/ week            Contractures Contractures Info: Not present    Additional Factors Info  Code Status, Allergies Code Status Info: full Allergies Info: Azathioprine   Ciprofloxacin   Levaquin (Levofloxacin In D5w)   Plendil (Felodipine)           Current Medications (02/28/2021):  This is the current hospital active medication list Current  Facility-Administered Medications  Medication Dose Route Frequency Provider Last Rate Last Admin   0.9 %  sodium chloride infusion   Intravenous Continuous Darliss Cheney, MD 100 mL/hr at 02/28/21 1207 New Bag at 02/28/21 1207   acetaminophen (TYLENOL) tablet 650 mg  650 mg Oral Q6H PRN Chotiner, Yevonne Aline, MD       Or   acetaminophen (TYLENOL)  suppository 650 mg  650 mg Rectal Q6H PRN Chotiner, Yevonne Aline, MD       apixaban Arne [place]) tablet 5 mg  5 mg Oral BID Chotiner, Yevonne Aline, MD   5 mg at 02/28/21 1103   budesonide (ENTOCORT EC) 24 hr capsule 9 mg  9 mg Oral Daily Pahwani, Einar Grad, MD   9 mg at 02/28/21 1101   cholecalciferol (VITAMIN D3) tablet 5,000 Units  5,000 Units Oral Daily Darliss Cheney, MD   5,000 Units at 02/28/21 1103   divalproex (DEPAKOTE ER) 24 hr tablet 1,500 mg  1,500 mg Oral QHS Pahwani, Einar Grad, MD   1,500 mg at 02/27/21 2111   ferrous sulfate tablet 325 mg  325 mg Oral BID WC Pahwani, Einar Grad, MD   325 mg at 02/28/21 0849   ipratropium-albuterol (DUONEB) 0.5-2.5 (3) MG/3ML nebulizer solution 3 mL  3 mL Nebulization Q4H PRN Chotiner, Yevonne Aline, MD       isosorbide mononitrate (IMDUR) 24 hr tablet 60 mg  60 mg Oral Daily Chotiner, Yevonne Aline, MD   60 mg at 02/28/21 1102   ivabradine (CORLANOR) tablet 5 mg  5 mg Oral BID WC Darliss Cheney, MD   5 mg at 02/28/21 0849   metoprolol succinate (TOPROL-XL) 24 hr tablet 37.5 mg  37.5 mg Oral Daily Chotiner, Yevonne Aline, MD   37.5 mg at 02/28/21 1104   mirtazapine (REMERON) tablet 7.5 mg  7.5 mg Oral QHS Chotiner, Yevonne Aline, MD   7.5 mg at 02/27/21 2111   multivitamin with minerals tablet 1 tablet  1 tablet Oral Daily Chotiner, Yevonne Aline, MD   1 tablet at 02/28/21 1104   ondansetron (ZOFRAN) tablet 4 mg  4 mg Oral Q6H PRN Chotiner, Yevonne Aline, MD       Or   ondansetron (ZOFRAN) injection 4 mg  4 mg Intravenous Q6H PRN Chotiner, Yevonne Aline, MD       pantoprazole (PROTONIX) EC tablet 40 mg  40 mg Oral Daily Pahwani, Einar Grad, MD   40 mg at 02/28/21 1105   QUEtiapine (SEROQUEL) tablet 150 mg  150 mg Oral QHS Darliss Cheney, MD   150 mg at 02/27/21 2111   senna-docusate (Senokot-S) tablet 1 tablet  1 tablet Oral QHS PRN Chotiner, Yevonne Aline, MD       tamsulosin [facility]) capsule 0.4 mg  0.4 mg Oral Daily Chotiner, Yevonne Aline, MD   0.4 mg at 02/28/21 1103     Discharge Medications: Please see  discharge summary for a list of discharge medications.  Relevant Imaging Results:  Relevant Lab Results:   Additional Information SS#: [id number] COVID-19 Vaccine 10/25/2019 , 05/18/2019 , 04/20/2019  Moderna Covid-19 vaccine Bivalent Booster 11/19/2020;  6' 3"  160lbs  Milinda Antis, LCSWA

## 2021-03-01 DIAGNOSIS — D539 Nutritional anemia, unspecified: Secondary | ICD-10-CM | POA: Diagnosis not present

## 2021-03-01 DIAGNOSIS — R1312 Dysphagia, oropharyngeal phase: Secondary | ICD-10-CM | POA: Diagnosis not present

## 2021-03-01 DIAGNOSIS — N189 Chronic kidney disease, unspecified: Secondary | ICD-10-CM | POA: Diagnosis not present

## 2021-03-01 DIAGNOSIS — I480 Paroxysmal atrial fibrillation: Secondary | ICD-10-CM | POA: Diagnosis not present

## 2021-03-01 DIAGNOSIS — R2681 Unsteadiness on feet: Secondary | ICD-10-CM | POA: Diagnosis not present

## 2021-03-01 DIAGNOSIS — I10 Essential (primary) hypertension: Secondary | ICD-10-CM | POA: Diagnosis not present

## 2021-03-01 DIAGNOSIS — N179 Acute kidney failure, unspecified: Secondary | ICD-10-CM | POA: Diagnosis not present

## 2021-03-01 DIAGNOSIS — R278 Other lack of coordination: Secondary | ICD-10-CM | POA: Diagnosis not present

## 2021-03-01 DIAGNOSIS — E875 Hyperkalemia: Secondary | ICD-10-CM | POA: Diagnosis not present

## 2021-03-01 DIAGNOSIS — E1122 Type 2 diabetes mellitus with diabetic chronic kidney disease: Secondary | ICD-10-CM | POA: Diagnosis not present

## 2021-03-01 DIAGNOSIS — J449 Chronic obstructive pulmonary disease, unspecified: Secondary | ICD-10-CM | POA: Diagnosis not present

## 2021-03-01 DIAGNOSIS — M6259 Muscle wasting and atrophy, not elsewhere classified, multiple sites: Secondary | ICD-10-CM | POA: Diagnosis not present

## 2021-03-01 DIAGNOSIS — R531 Weakness: Secondary | ICD-10-CM | POA: Diagnosis not present

## 2021-03-01 DIAGNOSIS — Z95810 Presence of automatic (implantable) cardiac defibrillator: Secondary | ICD-10-CM | POA: Diagnosis not present

## 2021-03-01 DIAGNOSIS — I5042 Chronic combined systolic (congestive) and diastolic (congestive) heart failure: Secondary | ICD-10-CM | POA: Diagnosis not present

## 2021-03-01 DIAGNOSIS — N183 Chronic kidney disease, stage 3 unspecified: Secondary | ICD-10-CM | POA: Diagnosis not present

## 2021-03-01 DIAGNOSIS — M2550 Pain in unspecified joint: Secondary | ICD-10-CM | POA: Diagnosis not present

## 2021-03-01 DIAGNOSIS — M6281 Muscle weakness (generalized): Secondary | ICD-10-CM | POA: Diagnosis not present

## 2021-03-01 DIAGNOSIS — N1832 Chronic kidney disease, stage 3b: Secondary | ICD-10-CM | POA: Diagnosis not present

## 2021-03-01 DIAGNOSIS — M1A9XX0 Chronic gout, unspecified, without tophus (tophi): Secondary | ICD-10-CM | POA: Diagnosis not present

## 2021-03-01 DIAGNOSIS — Z7401 Bed confinement status: Secondary | ICD-10-CM | POA: Diagnosis not present

## 2021-03-01 DIAGNOSIS — R1310 Dysphagia, unspecified: Secondary | ICD-10-CM | POA: Diagnosis not present

## 2021-03-01 DIAGNOSIS — I5022 Chronic systolic (congestive) heart failure: Secondary | ICD-10-CM | POA: Diagnosis not present

## 2021-03-01 LAB — BASIC METABOLIC PANEL
Anion gap: 6 (ref 5–15)
BUN: 33 mg/dL — ABNORMAL HIGH (ref 8–23)
CO2: 20 mmol/L — ABNORMAL LOW (ref 22–32)
Calcium: 8.4 mg/dL — ABNORMAL LOW (ref 8.9–10.3)
Chloride: 111 mmol/L (ref 98–111)
Creatinine, Ser: 2.3 mg/dL — ABNORMAL HIGH (ref 0.61–1.24)
GFR, Estimated: 29 mL/min — ABNORMAL LOW (ref >60)
Glucose, Bld: 80 mg/dL (ref 70–99)
Potassium: 4.6 mmol/L (ref 3.5–5.1)
Sodium: 137 mmol/L (ref 135–145)

## 2021-03-01 LAB — RESP PANEL BY RT-PCR (FLU A&B, COVID) ARPGX2
Influenza A by PCR: NEGATIVE
Influenza B by PCR: NEGATIVE
SARS Coronavirus 2 by RT PCR: NEGATIVE

## 2021-03-01 MED ORDER — FOLIC ACID 1 MG PO TABS: 1.0000 mg | ORAL_TABLET | Freq: Every day | ORAL | 0 refills | Status: DC

## 2021-03-01 NOTE — Progress Notes (Signed)
Physical Therapy Treatment Patient Details Name: SAMANYU TINNELL MRN: [id number] DOB: January 22, 1946 Today's Date: 03/01/2021   History of Present Illness Pt is a 76 y.o. M who presents 02/25/2021 with generalized weakness, AKI, hyperkalemia. Significant PMH: CKD 3B, HTN, COPD, HFrEF, celiac disease, BPH, gout, ICD.    PT Comments    Pt tolerates treatment well, ambulating for increased distances. Pt continues to demonstrate imbalance when without BUE support of walker, and will benefit from continued dynamic gait and balance training to reduce falls risk. Due to limited caregiver support pt will benefit from further inpatient PT services to reduce falls risk during dynamic standing activities like ADLs.   Recommendations for follow up therapy are one component of a multi-disciplinary discharge planning process, led by the attending physician.  Recommendations may be updated based on patient status, additional functional criteria and insurance authorization.  Follow Up Recommendations  Skilled nursing-short term rehab (<3 hours/day)     Assistance Recommended at Discharge Intermittent Supervision/Assistance  Patient can return home with the following A little help with walking and/or transfers;Help with stairs or ramp for entrance   Equipment Recommendations  None recommended by PT (pt already owns necessary DME)    Recommendations for Other Services       Precautions / Restrictions Precautions Precautions: Fall Restrictions Weight Bearing Restrictions: No     Mobility  Bed Mobility               General bed mobility comments: pt received sitting at edge of bed    Transfers Overall transfer level: Needs assistance Equipment used: Rolling walker (2 wheels) Transfers: Sit to/from Stand Sit to Stand: Min guard                Ambulation/Gait Ambulation/Gait assistance: Min guard Gait Distance (Feet): 250 Feet Assistive device: Rolling walker (2 wheels) Gait  Pattern/deviations: Step-through pattern;Trunk flexed Gait velocity: reduced Gait velocity interpretation: <1.8 ft/sec, indicate of risk for recurrent falls   General Gait Details: pt with slowed step-through gait   Stairs             Wheelchair Mobility    Modified Rankin (Stroke Patients Only)       Balance Overall balance assessment: Needs assistance Sitting-balance support: No upper extremity supported;Feet supported Sitting balance-Leahy Scale: Good     Standing balance support: Bilateral upper extremity supported;Reliant on assistive device for balance Standing balance-Leahy Scale: Poor Standing balance comment: pt with lateral losses of balance when removing one hand from walker when ambulating                            Cognition Arousal/Alertness: Awake/alert Behavior During Therapy: WFL for tasks assessed/performed Overall Cognitive Status: Within Functional Limits for tasks assessed                                          Exercises      General Comments General comments (skin integrity, edema, etc.): VSS on RA      Pertinent Vitals/Pain Pain Assessment: No/denies pain    Home Living                          Prior Function            PT Goals (current goals can now be found in the care  plan section) Acute Rehab PT Goals Patient Stated Goal: go home Progress towards PT goals: Progressing toward goals    Frequency    Min 3X/week      PT Plan Current plan remains appropriate    Co-evaluation              AM-PAC PT "6 Clicks" Mobility   Outcome Measure  Help needed turning from your back to your side while in a flat bed without using bedrails?: A Little Help needed moving from lying on your back to sitting on the side of a flat bed without using bedrails?: A Little Help needed moving to and from a bed to a chair (including a wheelchair)?: A Little Help needed standing up from a chair  using your arms (e.g., wheelchair or bedside chair)?: A Little Help needed to walk in hospital room?: A Little Help needed climbing 3-5 steps with a railing? : Total 6 Click Score: 16    End of Session   Activity Tolerance: Patient tolerated treatment well Patient left: in chair;with call bell/phone within reach;with chair alarm set Nurse Communication: Mobility status PT Visit Diagnosis: Unsteadiness on feet (R26.81);Other abnormalities of gait and mobility (R26.89);Muscle weakness (generalized) (M62.81);History of falling (Z91.81);Difficulty in walking, not elsewhere classified (R26.2)     Time: 9449-6759 PT Time Calculation (min) (ACUTE ONLY): 20 min  Charges:  $Gait Training: 8-22 mins                     Zenaida Niece, PT, DPT Acute Rehabilitation Pager: [phone] Office [place] 03/01/2021, 2:39 PM

## 2021-03-01 NOTE — TOC Progression Note (Signed)
Transition of Care [facility]) - Initial/Assessment Note    Patient Details  Name: Brent [name] MRN: [id number] Date of Birth: [birth year]  [facility]) CM/SW Contact:    Milinda Antis, [place] Phone Number: 03/01/2021, 11:05 AM  Clinical Narrative:                 CSW received patient's PASRR number ([id number]) and submitted for insurance auth now that the patient has a bed offer at [facility].  Pending: insurance auth.   Expected Discharge Plan: [place] Barriers to Discharge: Continued Medical Work up   Patient Goals and CMS Choice Patient states their goals for this hospitalization and ongoing recovery are:: To return home CMS Medicare.gov Compare Post Acute Care list provided to:: Patient Choice offered to / list presented to : Patient  Expected Discharge Plan and Services Expected Discharge Plan: [place]   Discharge Planning Services: CM Consult Post Acute Care Choice: [place] arrangements for the past 2 months: Apartment                 DME Arranged: N/A DME Agency: NA       HH Arranged: PT, OT, Social Work [place] Agency: [place] Date [facility] Agency Contacted: 02/27/21 Time [place]: 26 Representative spoke with at [place]: [place] Arrangements/Services Living arrangements for the past 2 months: [place] with:: Self Patient language and need for interpreter reviewed:: Yes Do you feel safe going back to the place where you live?: Yes      Need for Family Participation in Patient Care: Yes (Comment) Care giver support system in place?: Yes (comment)   Criminal Activity/Legal Involvement Pertinent to Current Situation/Hospitalization: No - Comment as needed  Activities of Daily Living      Permission Sought/Granted Permission sought to share information with : Case Manager, Customer service manager, Family Supports Permission granted to share  information with : Yes, Verbal Permission Granted              Emotional Assessment Appearance:: Appears stated age Attitude/Demeanor/Rapport: Engaged, Gracious Affect (typically observed): Accepting, Appropriate, Calm, Hopeful Orientation: : Oriented to Self, Oriented to Place, Oriented to  Time, Oriented to Situation Alcohol / Substance Use: Not Applicable Psych Involvement: No (comment)  Admission diagnosis:  Hyperkalemia [E87.5] Acute kidney injury superimposed on chronic kidney disease ([place]) [N17.9, N18.9] Patient Active Problem List   Diagnosis Date Noted   Acute kidney injury superimposed on chronic kidney disease ([place]) 02/25/2021   Generalized weakness 02/25/2021   SIRS (systemic inflammatory response syndrome) ([place]) 10/31/2020   Neurocognitive deficits 10/17/2020   Hypokalemia 10/08/2020   Hypomagnesemia 10/08/2020   CKD (chronic kidney disease), stage III ([place]) 10/08/2020   COVID-19 virus infection 10/08/2020   Electrolyte abnormality 05/17/2020   ICD (implantable cardioverter-defibrillator) in place 12/87/8676   Chronic systolic heart failure ([place]) 01/02/2020   Decreased appetite 10/08/2019   Weakness 10/08/2019   Hyperkalemia 72/10/4707   Chronic systolic CHF (congestive heart failure) ([place]) 08/26/2019   Type 2 diabetes mellitus with stage 3 chronic kidney disease ([place]) 08/26/2019   Chronic obstructive pulmonary disease ([place]) 06/20/2019   Ventricular tachycardia 06/20/2019   Acute on chronic systolic (congestive) heart failure ([place]) 62/83/6629   Metabolic acidosis 47/65/4650   Orthostasis 09/07/2018   AKI (acute kidney injury) ([place])    Immunosuppressed status (HCC)    Macrocytic anemia    Chronic combined systolic and diastolic congestive heart  failure ([place]) 06/03/2018   Candida esophagitis ([place]) 09/22/2017   History of smoking 30 or more pack years 01/05/2017   Bipolar 1 disorder ([place])    BPH (benign prostatic hyperplasia) 03/31/2013   Anemia in chronic  kidney disease 03/31/2013   Essential hypertension, benign 03/31/2013   Acute renal failure superimposed on stage 3 chronic kidney disease ([place]) 06/04/2012   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[place]) 01/23/2010   PCP:  Sandrea Hughs, NP Pharmacy:   [facility] [phone] [place], [place] - [place] [address] Phone: [phone] Fax: [phone]  [id number] - [place], [place] - [address] [place] [address] Phone: [phone] Fax: [phone]     Social Determinants of Health (SDOH) Interventions    Readmission Risk Interventions No flowsheet data found.

## 2021-03-01 NOTE — TOC Transition Note (Signed)
Transition of Care [facility]) - CM/SW Discharge Note   Patient Details  Name: Brent [name] MRN: [id number] Date of Birth: March 30, 1945  Transition [facility]) CM/SW Contact:  Milinda Antis, [place] Phone Number: 03/01/2021, 2:33 PM   Clinical Narrative:    Patient will DC to: [facility] Anticipated DC date: 03/01/2021 Family notified: Yes Transport by: Corey Harold   Per MD patient ready for DC to SNF. RN to call report prior to discharge [phone].  He will be going to room 224.  RN, patient, patient's family, and facility notified of DC. Discharge Summary and FL2 sent to facility. DC packet on chart. Ambulance transport requested for patient.   CSW will sign off for now as social work intervention is no longer needed. Please consult [name] again if new needs arise.     Final next level of care: Skilled Nursing Facility Barriers to Discharge: Barriers Resolved   Patient Goals and CMS Choice Patient states their goals for this hospitalization and ongoing recovery are:: To return home CMS Medicare.gov Compare Post Acute Care list provided to:: Patient Choice offered to / list presented to : Patient  Discharge Placement              Patient chooses bed at: [facility] Patient to be transferred to facility by: [place] Name of family member notified: [name],[name] Patient and family notified of of transfer: 03/01/21  Discharge Plan and Services   Discharge Planning Services: CM Consult Post Acute Care Choice: Home Health          DME Arranged: N/A DME Agency: NA       HH Arranged: PT, OT, Social Work [employer] Agency: [place] Date [facility] Agency Contacted: 02/27/21 Time [place]: 1630 Representative spoke with at [place]: [place] ([place]) Interventions     Readmission Risk Interventions No flowsheet data found.

## 2021-03-01 NOTE — Progress Notes (Signed)
Occupational Therapy Treatment Patient Details Name: Brent [name] MRN: [id number] DOB: 28-May-1945 Today's Date: 03/01/2021   History of present illness Pt is a 76 y.o. M who presents 02/25/2021 with generalized weakness, AKI, hyperkalemia. Significant PMH: CKD 3B, HTN, COPD, HFrEF, celiac disease, BPH, gout, ICD.   OT comments  Pt making slow and steady progress toward goals. Pt overall min assist with adls. Pt reports fatigue with adls which worries him when home alone. Pt does have a caregiver that comes 3x a week for a few hours and EMS comes on Wednesdays to fill pill box.  Continue to focus on safety with adls on feet.     Recommendations for follow up therapy are one component of a multi-disciplinary discharge planning process, led by the attending physician.  Recommendations may be updated based on patient status, additional functional criteria and insurance authorization.    Follow Up Recommendations  Skilled nursing-short term rehab (<3 hours/day)    Assistance Recommended at Discharge Frequent or constant Supervision/Assistance  Patient can return home with the following  A little help with walking and/or transfers;A lot of help with bathing/dressing/bathroom   Equipment Recommendations  Tub/shower seat;BSC/3in1    Recommendations for Other Services      Precautions / Restrictions Precautions Precautions: Fall       Mobility Bed Mobility Overal bed mobility: Needs Assistance Bed Mobility: Supine to Sit;Sit to Supine     Supine to sit: Supervision Sit to supine: Supervision   General bed mobility comments: Increased time, cues to initiate    Transfers Overall transfer level: Needs assistance Equipment used: Rolling walker (2 wheels) Transfers: Sit to/from Stand Sit to Stand: Min assist           General transfer comment: with bed elevated pt was able to tranfer sit to stand without physical assist. Worked on standing just using legs only as well      Balance Overall balance assessment: Needs assistance Sitting-balance support: Feet supported;No upper extremity supported Sitting balance-Leahy Scale: Good     Standing balance support: Bilateral upper extremity supported;During functional activity Standing balance-Leahy Scale: Poor Standing balance comment: reliant on RW                           ADL either performed or assessed with clinical judgement   ADL Overall ADL's : Needs assistance/impaired Eating/Feeding: Independent;Sitting   Grooming: Wash/dry hands;Wash/dry face;Oral care;Min guard;Standing Grooming Details (indicate cue type and reason): pt stood at sink for appx 3 minutes to groom with walker             Lower Body Dressing: Sit to/from stand;Minimal assistance;Cueing for compensatory techniques Lower Body Dressing Details (indicate cue type and reason): Pt donned pants at EOB with min assist to stand and maintain standing while pulling pants up. Pt donned socks at EOB without assist. Toilet Transfer: Minimal assistance;Ambulation;Rolling walker (2 wheels);Cueing for safety Toilet Transfer Details (indicate cue type and reason): walked to bathroom with walker and min assist Toileting- Clothing Manipulation and Hygiene: Minimal assistance;Sit to/from stand       Functional mobility during ADLs: Minimal assistance;Cueing for safety;Rolling walker (2 wheels) General ADL Comments: Pt does well with most adls. pt with some weakness and pt does live alone therefore rehab recommended    Extremity/Trunk Assessment Upper Extremity Assessment Upper Extremity Assessment: Overall WFL for tasks assessed RUE Deficits / Details: AROM shoulder ~100 degrees LUE Deficits / Details: AROM shoulder ~120 degrees  Vision   Vision Assessment?: No apparent visual deficits Additional Comments: wears glasses at all times for acuity   Perception Perception Perception: Within Functional Limits   Praxis  Praxis Praxis: Intact    Cognition Arousal/Alertness: Awake/alert Behavior During Therapy: WFL for tasks assessed/performed Overall Cognitive Status: Within Functional Limits for tasks assessed                                            Exercises Exercises: General Upper Extremity General Exercises - Upper Extremity Shoulder Flexion: Strengthening;Both;10 reps;Seated Shoulder Extension: Strengthening;Both;10 reps;Seated Elbow Flexion: Strengthening;Both;10 reps;Seated Elbow Extension: Strengthening;Both;10 reps;Seated   Shoulder Instructions       General Comments Pt most limited with safety when on his feet and with activity tolerance. Pt lives alone therefore needs to be safe on feet    Pertinent Vitals/ Pain       Pain Assessment: No/denies pain  Home Living                                          Prior Functioning/Environment              Frequency  Min 2X/week        Progress Toward Goals  OT Goals(current goals can now be found in the care plan section)  Progress towards OT goals: Progressing toward goals  Acute Rehab OT Goals Patient Stated Goal: to go home after rehab OT Goal Formulation: With patient Time For Goal Achievement: 03/14/21 Potential to Achieve Goals: Good ADL Goals Pt Will Perform Grooming: with min guard assist;with supervision;standing Pt Will Perform Upper Body Bathing: with set-up;sitting Pt Will Perform Lower Body Bathing: with mod assist Pt Will Perform Upper Body Dressing: with set-up;sitting Pt Will Transfer to Toilet: with min assist;with min guard assist;ambulating;regular height toilet;grab bars Pt Will Perform Toileting - Clothing Manipulation and hygiene: with min assist;with min guard assist;sit to/from stand  Plan Discharge plan remains appropriate    Co-evaluation                 AM-PAC OT "6 Clicks" Daily Activity     Outcome Measure   Help from another person eating  meals?: None Help from another person taking care of personal grooming?: A Little Help from another person toileting, which includes using toliet, bedpan, or urinal?: A Lot Help from another person bathing (including washing, rinsing, drying)?: A Lot Help from another person to put on and taking off regular upper body clothing?: A Little Help from another person to put on and taking off regular lower body clothing?: A Little 6 Click Score: 17    End of Session Equipment Utilized During Treatment: Gait belt;Rolling walker (2 wheels)  OT Visit Diagnosis: Unsteadiness on feet (R26.81);Other abnormalities of gait and mobility (R26.89);History of falling (Z91.81);Muscle weakness (generalized) (M62.81)   Activity Tolerance Patient limited by fatigue   Patient Left in bed;with bed alarm set;with call bell/phone within reach   Nurse Communication Mobility status        Time: 8315-1761 OT Time Calculation (min): 21 min  Charges: OT General Charges $OT Visit: 1 Visit OT Treatments $Self Care/Home Management : 8-22 mins    Glenford Peers 03/01/2021, 10:45 AM

## 2021-03-01 NOTE — Discharge Summary (Signed)
Physician Discharge Summary  ACEA YAGI ERX:[id number] DOB: 1945-07-30 DOA: 02/25/2021  PCP: Sandrea Hughs, NP  Admit date: 02/25/2021 Discharge date: 03/01/2021 30 Day Unplanned Readmission Risk Score    Flowsheet Row ED to Hosp-Admission (Current) from 02/25/2021 in [facility]  30 Day Unplanned Readmission Risk Score (%) 44.89 Filed at 03/01/2021 1200       This score is the patient's risk of an unplanned readmission within 30 days of being discharged (0 -100%). The score is based on dignosis, age, lab data, medications, orders, and past utilization.   Low:  0-14.9   Medium: 15-21.9   High: 22-29.9   Extreme: 30 and above          Admitted From: Home Disposition: SNF  Recommendations for Outpatient Follow-up:  Follow up with PCP in 1-2 weeks Please obtain BMP/CBC in one week Please follow up with your PCP on the following pending results: Unresulted Labs (From admission, onward)     Start     Ordered   03/01/21 1113  Resp Panel by RT-PCR (Flu A&B, Covid) Nasopharyngeal Swab  (Tier 2 - Symptomatic/asymptomatic)  Once,   R        03/01/21 [place]: None Equipment/Devices: None  Discharge Condition: Stable CODE STATUS: Full code Diet recommendation: Cardiac/low-sodium  Subjective: Seen and examined.  He has no complaints.  Brief/Interim Summary: Brent [name] is a 76 y.o. male with medical history significant for CKD 3B, HTN, COPD, HFrEF, celiac disease, BPH, gout who presented for evaluation of generalized weakness for 4 to 5 days. No loss of consciousness or any falls.  He does have an AICD in place. Has a history of smoking but quit over 20 years ago.  Denies alcohol or illicit drug use.  Upon arrival to the ED, he was hemodynamically stable with hyperkalemia 6.1 and creatinine of 3.62 which is elevated from his baseline of 2.15.  He was given Lokelma and calcium gluconate in the ED and admitted to hospital service.  Acute  kidney injury superimposed on chronic kidney disease 3B: Baseline creatinine around 2.15, presented with 3.62, slowly improving and down to 2.3 today.     Recurrent hyperkalemia: Patient received 2 doses of Lokelma and dextrose with IV insulin intermittently and his potassium is finally normalized.   Chronic systolic congestive heart failure: Appeared dehydrated, Lasix was held, he was given IV fluids.  Now we will resume his home dose of diuretics.   Generalized weakness: PT OT recommended SNF.  He was initially declining that but now he is being to go to SNF.  He is being discharged today.   Essential hypertension, benign: Resume home medications.   Chronic obstructive pulmonary disease  Stable.  DuoNebs every 4 hours as needed   ICD (implantable cardioverter-defibrillator) in place Stable   Chronic macrocytic anemia: Was found to have folate deficiency.  Will discharge on folic acid.   BPH: Flomax.  Discharge Diagnoses:  Principal Problem:   Acute kidney injury superimposed on chronic kidney disease ([place]) Active Problems:   Essential hypertension, benign   Chronic obstructive pulmonary disease (HCC)   Chronic systolic CHF (congestive heart failure) (HCC)   Type 2 diabetes mellitus with stage 3 chronic kidney disease ([place])   ICD (implantable cardioverter-defibrillator) in place   Generalized weakness    Discharge Instructions   Allergies as of 03/01/2021       Reactions  Azathioprine Other (See Comments)   REACTION: affected WBC "Almost died"   Ciprofloxacin Other (See Comments)   Reaction not recalled   Levaquin [levofloxacin In D5w] Other (See Comments)   Reaction not rec   Plendil [felodipine] Other (See Comments)   Reaction not not recalled        Medication List     TAKE these medications    acetaminophen 160 MG/5ML liquid Commonly known as: TYLENOL Take 10.2 mLs (325 mg total) by mouth every 6 (six) hours as needed for fever or pain.   allopurinol  100 MG tablet Commonly known as: ZYLOPRIM TAKE 2 TABLETS(200 MG) BY MOUTH DAILY What changed: See the new instructions.   apixaban 5 MG Tabs tablet Commonly known as: ELIQUIS Take 1 tablet (5 mg total) by mouth 2 (two) times daily.   budesonide 3 MG 24 hr capsule Commonly known as: ENTOCORT EC Take 3 capsules (9 mg total) by mouth daily.   Calcium 500 MG tablet Take 1 tablet (500 mg total) by mouth 1 day or 1 dose for 1 dose. What changed: when to take this   dapagliflozin propanediol 10 MG Tabs tablet Commonly known as: Farxiga Take 1 tablet (10 mg total) by mouth daily before breakfast.   divalproex 500 MG 24 hr tablet Commonly known as: DEPAKOTE ER TAKE 3 TABLETS(1500 MG) BY MOUTH AT BEDTIME What changed: See the new instructions.   epoetin alfa 10000 UNIT/ML injection Commonly known as: EPOGEN 10,000 Units every 30 (thirty) days.   feeding supplement Liqd Take 237 mLs by mouth 2 (two) times daily between meals.   ferrous sulfate 325 (65 FE) MG tablet Take 1 tablet (325 mg total) by mouth in the morning and at bedtime.   folic acid 1 MG tablet Commonly known as: FOLVITE Take 1 tablet (1 mg total) by mouth daily.   furosemide 20 MG tablet Commonly known as: LASIX Take 1 tablet (20 mg total) by mouth daily.   inFLIXimab 100 MG injection Commonly known as: Remicade Infuse Remicade IV schedule 1 85m/kg every 8 weeks Premedicate with Tylenol 500-6564mby mouth and Benadryl 25-5030my mouth prior to infusion. Last PPD was on 12/2009. What changed:  how much to take how to take this when to take this   isosorbide mononitrate 60 MG 24 hr tablet Commonly known as: IMDUR Take 1 tablet (60 mg total) by mouth daily.   ivabradine 5 MG Tabs tablet Commonly known as: Corlanor TAKE 1 TABLET(5 MG) BY MOUTH TWICE DAILY WITH A MEAL What changed:  how much to take how to take this when to take this additional instructions   loperamide 2 MG capsule Commonly known as:  IMODIUM Take 1 capsule (2 mg total) by mouth 3 (three) times daily as needed for diarrhea or loose stools.   magnesium oxide 400 MG tablet Commonly known as: MAG-OX Take 400 mg by mouth in the morning, at noon, and at bedtime.   metoprolol succinate 25 MG 24 hr tablet Commonly known as: TOPROL-XL Take 1.5 tablets (37.5 mg total) by mouth in the morning and at bedtime.   mirtazapine 7.5 MG tablet Commonly known as: REMERON TAKE 1 TABLET(7.5 MG) BY MOUTH AT BEDTIME What changed:  how much to take how to take this when to take this additional instructions   multivitamin with minerals Tabs tablet Take 1 tablet by mouth daily.   omeprazole 20 MG capsule Commonly known as: PRILOSEC Take 1 capsule (20 mg total) by mouth daily.  QUEtiapine 100 MG tablet Commonly known as: SEROQUEL Take 1.5 tablets (150 mg total) by mouth at bedtime.   solifenacin 5 MG tablet Commonly known as: VESICARE TAKE 1 TABLET(5 MG) BY MOUTH DAILY What changed:  how much to take how to take this when to take this additional instructions   spironolactone 25 MG tablet Commonly known as: Aldactone Take 0.5 tablets (12.5 mg total) by mouth daily.   tamsulosin 0.4 MG Caps capsule Commonly known as: FLOMAX Take 1 capsule (0.4 mg total) by mouth daily.   vitamin B-12 500 MCG tablet Commonly known as: CYANOCOBALAMIN Take 1,000 mcg by mouth daily.   Vitamin D 125 MCG (5000 UT) Caps Take 5,000 Units by mouth daily.        Follow-up Information     [name], Dinah C, NP Follow up in 1 week(s).   Specialty: Family Medicine Contact information: [place] [address] [phone]         Martinique, Peter M, MD .   Specialty: Cardiology Contact information: [address] [place] [address] [place] [address] [phone]         Larey Dresser, MD .   Specialty: Cardiology Contact information: [address] [place] [address] [phone]                 Allergies  Allergen Reactions   Azathioprine Other (See Comments)    REACTION: affected WBC "Almost died"   Ciprofloxacin Other (See Comments)    Reaction not recalled   Levaquin [Levofloxacin In D5w] Other (See Comments)    Reaction not rec   Plendil [Felodipine] Other (See Comments)    Reaction not not recalled    Consultations: None   Procedures/Studies: CT Head Wo Contrast  Result Date: 02/25/2021 CLINICAL DATA:  Sudden onset of weakness today resulting in a fall. EXAM: CT HEAD WITHOUT CONTRAST TECHNIQUE: Contiguous axial images were obtained from the base of the skull through the vertex without intravenous contrast. COMPARISON:  Head CT 10/31/2020 and MRI 02/07/2018 FINDINGS: Brain: There is no evidence of an acute infarct, intracranial hemorrhage, mass, midline shift, or extra-axial fluid collection. Hypodensities in the cerebral white matter bilaterally are unchanged and nonspecific but compatible with mild chronic small vessel ischemic disease. Mild cerebral atrophy is unchanged. Vascular: No hyperdense vessel. Skull: No fracture or suspicious osseous lesion. Sinuses/Orbits: Minimal bilateral ethmoid sinus and left maxillary sinus mucosal thickening. Clear mastoid air cells. Bilateral cataract extraction. Other: None. IMPRESSION: 1. No evidence of acute intracranial abnormality. 2. Mild chronic small vessel ischemic disease. Electronically Signed   By: Logan Bores M.D.   On: 02/25/2021 17:37   DG Chest Portable 1 View  Result Date: 02/25/2021 CLINICAL DATA:  Generalized weakness. 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 Stable normal heart size. Unchanged mediastinal contours. No focal airspace disease, pleural effusion, or pneumothorax. No pulmonary edema. No acute osseous abnormalities are seen. IMPRESSION: No acute chest finding. Electronically Signed   By: Keith Rake M.D.   On: 02/25/2021 21:56     Discharge  Exam: Vitals:   03/01/21 0509 03/01/21 0924  BP: (!) 166/93 (!) 163/88  Pulse: 88 90  Resp: 18 18  Temp: 98.5 F (36.9 C) 97.8 F (36.6 C)  SpO2: 95% 92%   Vitals:   03/01/21 0115 03/01/21 0500 03/01/21 0509 03/01/21 0924  BP:   (!) 166/93 (!) 163/88  Pulse:   88 90  Resp:  18 18  Temp:   98.5 F (36.9 C) 97.8 F (36.6 C)  TempSrc:      SpO2: 93%  95% 92%  Weight:  80.1 kg    Height:        General: Pt is alert, awake, not in acute distress Cardiovascular: RRR, S1/S2 +, no rubs, no gallops Respiratory: CTA bilaterally, no wheezing, no rhonchi Abdominal: Soft, NT, ND, bowel sounds + Extremities: no edema, no cyanosis    The results of significant diagnostics from this hospitalization (including imaging, microbiology, ancillary and laboratory) are listed below for reference.     Microbiology: Recent Results (from the past 240 hour(s))  Resp Panel by RT-PCR (Flu A&B, Covid) Nasopharyngeal Swab     Status: None   Collection Time: 02/25/21  9:42 PM   Specimen: Nasopharyngeal Swab; Nasopharyngeal(NP) swabs in vial transport medium  Result Value Ref Range Status   SARS Coronavirus 2 by RT PCR NEGATIVE NEGATIVE Final    Comment: (NOTE) SARS-CoV-2 target nucleic acids are NOT DETECTED.  The SARS-CoV-2 RNA is generally detectable in upper respiratory specimens during the acute phase of infection. The lowest concentration of SARS-CoV-2 viral copies this assay can detect is 138 copies/mL. A negative result does not preclude SARS-Cov-2 infection and should not be used as the sole basis for treatment or other patient management decisions. A negative result may occur with  improper specimen collection/handling, submission of specimen other than nasopharyngeal swab, presence of viral mutation(s) within the areas targeted by this assay, and inadequate number of viral copies(<138 copies/mL). A negative result must be combined with clinical observations, patient history, and  epidemiological information. The expected result is Negative.  Fact Sheet for Patients:  EntrepreneurPulse.com.au  Fact Sheet for Healthcare Providers:  IncredibleEmployment.be  This test is no t yet approved or cleared by the [place] FDA and  has been authorized for detection and/or diagnosis of SARS-CoV-2 by FDA under an Emergency Use Authorization (EUA). This EUA will remain  in effect (meaning this test can be used) for the duration of the COVID-19 declaration under Section 564(b)(1) of the Act, 21 U.S.C.section 360bbb-3(b)(1), unless the authorization is terminated  or revoked sooner.       Influenza A by PCR NEGATIVE NEGATIVE Final   Influenza B by PCR NEGATIVE NEGATIVE Final    Comment: (NOTE) The Xpert Xpress SARS-CoV-2/FLU/RSV plus assay is intended as an aid in the diagnosis of influenza from Nasopharyngeal swab specimens and should not be used as a sole basis for treatment. Nasal washings and aspirates are unacceptable for Xpert Xpress SARS-CoV-2/FLU/RSV testing.  Fact Sheet for Patients: EntrepreneurPulse.com.au  Fact Sheet for Healthcare Providers: IncredibleEmployment.be  This test is not yet approved or cleared by the [place] FDA and has been authorized for detection and/or diagnosis of SARS-CoV-2 by FDA under an Emergency Use Authorization (EUA). This EUA will remain in effect (meaning this test can be used) for the duration of the COVID-19 declaration under Section 564(b)(1) of the Act, 21 U.S.C. section 360bbb-3(b)(1), unless the authorization is terminated or revoked.  Performed at [facility], [place] [address], [place], [place] [address]      Labs: BNP (last 3 results) Recent Labs    12/26/20 1206  BNP 644.0*   Basic Metabolic Panel: Recent Labs  Lab 02/25/21 1614 02/25/21 1754 02/26/21 0319 02/27/21 0310 02/27/21 1353 02/28/21 0255 02/28/21 1432  03/01/21 0708  NA 136  --  136 134*  --  138  --  137  K 6.1*  --  5.2* 5.7* 4.9 5.5* 4.9 4.6  CL 98  --  100 102  --  111  --  111  CO2 32  --  28 27  --  23  --  20*  GLUCOSE 95  --  118* 113*  --  97  --  80  BUN 32*  --  33* 36*  --  35*  --  33*  CREATININE 3.62*  --  3.34* 3.01*  --  2.81*  --  2.30*  CALCIUM 9.7  --  9.2 8.9  --  8.6*  --  8.4*  MG  --  2.2  --  1.8  --  1.7  --   --    Liver Function Tests: No results for input(s): AST, ALT, ALKPHOS, BILITOT, PROT, ALBUMIN in the last 168 hours. No results for input(s): LIPASE, AMYLASE in the last 168 hours. No results for input(s): AMMONIA in the last 168 hours. CBC: Recent Labs  Lab 02/25/21 1614 02/26/21 0319 02/27/21 0310 02/28/21 0255  WBC 13.3* 10.7* 8.6 9.7  NEUTROABS  --   --  5.9  --   HGB 11.8* 11.1* 10.5* 9.8*  HCT 38.5* 36.2* 33.0* 31.2*  MCV 111.9* 111.7* 107.8* 109.9*  PLT 165 146* 145* 155   Cardiac Enzymes: No results for input(s): CKTOTAL, CKMB, CKMBINDEX, TROPONINI in the last 168 hours. BNP: Invalid input(s): POCBNP CBG: Recent Labs  Lab 02/27/21 1013  GLUCAP 83   D-Dimer No results for input(s): DDIMER in the last 72 hours. Hgb A1c Recent Labs    02/26/21 1540  HGBA1C 4.9   Lipid Profile No results for input(s): CHOL, HDL, LDLCALC, TRIG, CHOLHDL, LDLDIRECT in the last 72 hours. Thyroid function studies No results for input(s): TSH, T4TOTAL, T3FREE, THYROIDAB in the last 72 hours.  Invalid input(s): FREET3 Anemia work up Recent Labs    02/28/21 1432  VITAMINB12 563  FOLATE 18.5   Urinalysis    Component Value Date/Time   COLORURINE YELLOW 02/25/2021 [place] 02/25/2021 1722   LABSPEC 1.017 02/25/2021 1722   PHURINE 8.0 02/25/2021 1722   GLUCOSEU NEGATIVE 02/25/2021 1722   HGBUR NEGATIVE 02/25/2021 1722   BILIRUBINUR NEGATIVE 02/25/2021 1722   BILIRUBINUR Positive 04/26/2020 1504   KETONESUR 5 (A) 02/25/2021 1722   PROTEINUR NEGATIVE 02/25/2021 1722    UROBILINOGEN negative (A) 04/26/2020 1504   UROBILINOGEN 0.2 07/10/2010 1709   NITRITE NEGATIVE 02/25/2021 1722   LEUKOCYTESUR NEGATIVE 02/25/2021 1722   Sepsis Labs Invalid input(s): PROCALCITONIN,  WBC,  LACTICIDVEN Microbiology Recent Results (from the past 240 hour(s))  Resp Panel by RT-PCR (Flu A&B, Covid) Nasopharyngeal Swab     Status: None   Collection Time: 02/25/21  9:42 PM   Specimen: Nasopharyngeal Swab; Nasopharyngeal(NP) swabs in vial transport medium  Result Value Ref Range Status   SARS Coronavirus 2 by RT PCR NEGATIVE NEGATIVE Final    Comment: (NOTE) SARS-CoV-2 target nucleic acids are NOT DETECTED.  The SARS-CoV-2 RNA is generally detectable in upper respiratory specimens during the acute phase of infection. The lowest concentration of SARS-CoV-2 viral copies this assay can detect is 138 copies/mL. A negative result does not preclude SARS-Cov-2 infection and should not be used as the sole basis for treatment or other patient management decisions. A negative result may occur with  improper specimen collection/handling, submission of specimen other than nasopharyngeal swab, presence of viral mutation(s) within the areas targeted by this assay, and inadequate number of  viral copies(<138 copies/mL). A negative result must be combined with clinical observations, patient history, and epidemiological information. The expected result is Negative.  Fact Sheet for Patients:  EntrepreneurPulse.com.au  Fact Sheet for Healthcare Providers:  IncredibleEmployment.be  This test is no t yet approved or cleared by the [place] FDA and  has been authorized for detection and/or diagnosis of SARS-CoV-2 by FDA under an Emergency Use Authorization (EUA). This EUA will remain  in effect (meaning this test can be used) for the duration of the COVID-19 declaration under Section 564(b)(1) of the Act, 21 U.S.C.section 360bbb-3(b)(1), unless the  authorization is terminated  or revoked sooner.       Influenza A by PCR NEGATIVE NEGATIVE Final   Influenza B by PCR NEGATIVE NEGATIVE Final    Comment: (NOTE) The Xpert Xpress SARS-CoV-2/FLU/RSV plus assay is intended as an aid in the diagnosis of influenza from Nasopharyngeal swab specimens and should not be used as a sole basis for treatment. Nasal washings and aspirates are unacceptable for Xpert Xpress SARS-CoV-2/FLU/RSV testing.  Fact Sheet for Patients: EntrepreneurPulse.com.au  Fact Sheet for Healthcare Providers: IncredibleEmployment.be  This test is not yet approved or cleared by the [place] FDA and has been authorized for detection and/or diagnosis of SARS-CoV-2 by FDA under an Emergency Use Authorization (EUA). This EUA will remain in effect (meaning this test can be used) for the duration of the COVID-19 declaration under Section 564(b)(1) of the Act, 21 U.S.C. section 360bbb-3(b)(1), unless the authorization is terminated or revoked.  Performed at [facility], [place] [address] [place], [place] [address]      Time coordinating discharge: Over 30 minutes  SIGNED:   Darliss Cheney, MD  [facility] 03/01/2021, 12:07 PM  If 7PM-7AM, please contact night-coverage [email or web address]

## 2021-03-04 ENCOUNTER — Non-Acute Institutional Stay (SKILLED_NURSING_FACILITY): Payer: Medicare Other | Admitting: Adult Health

## 2021-03-04 ENCOUNTER — Encounter: Payer: Self-pay | Admitting: Adult Health

## 2021-03-04 DIAGNOSIS — R531 Weakness: Secondary | ICD-10-CM

## 2021-03-04 DIAGNOSIS — E875 Hyperkalemia: Secondary | ICD-10-CM | POA: Diagnosis not present

## 2021-03-04 DIAGNOSIS — E1122 Type 2 diabetes mellitus with diabetic chronic kidney disease: Secondary | ICD-10-CM | POA: Diagnosis not present

## 2021-03-04 DIAGNOSIS — I10 Essential (primary) hypertension: Secondary | ICD-10-CM | POA: Diagnosis not present

## 2021-03-04 DIAGNOSIS — R2681 Unsteadiness on feet: Secondary | ICD-10-CM | POA: Diagnosis not present

## 2021-03-04 DIAGNOSIS — I5022 Chronic systolic (congestive) heart failure: Secondary | ICD-10-CM

## 2021-03-04 DIAGNOSIS — N1832 Chronic kidney disease, stage 3b: Secondary | ICD-10-CM

## 2021-03-04 DIAGNOSIS — N179 Acute kidney failure, unspecified: Secondary | ICD-10-CM

## 2021-03-04 DIAGNOSIS — J449 Chronic obstructive pulmonary disease, unspecified: Secondary | ICD-10-CM

## 2021-03-04 DIAGNOSIS — R1312 Dysphagia, oropharyngeal phase: Secondary | ICD-10-CM | POA: Diagnosis not present

## 2021-03-04 DIAGNOSIS — N189 Chronic kidney disease, unspecified: Secondary | ICD-10-CM | POA: Diagnosis not present

## 2021-03-04 DIAGNOSIS — M6281 Muscle weakness (generalized): Secondary | ICD-10-CM | POA: Diagnosis not present

## 2021-03-04 IMAGING — CT CT HEAD WITHOUT CONTRAST
3 series · 16 of 47 positions shown, 19 images · non-contrast
Comparison: 02/07/2018

CLINICAL DATA: Ataxia

EXAM:
CT HEAD WITHOUT CONTRAST
TECHNIQUE: Contiguous axial images were obtained from the base of the skull
through the vertex without intravenous contrast.

[Series 3: head 5.0 h30s · axial · 0.46mm/px · z∈[-78,+72]mm · 10 of 36 slices shown, 13 images]
[im 3/36  brain]
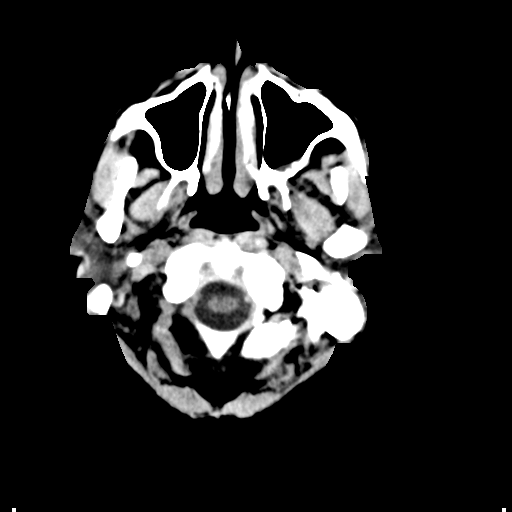
[im 3/36  bone]
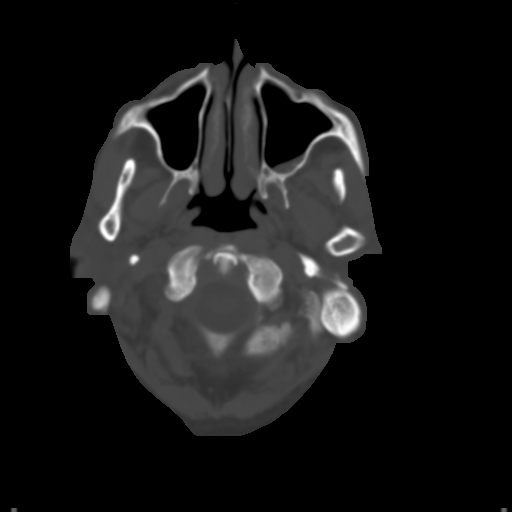
[im 7/36  brain]
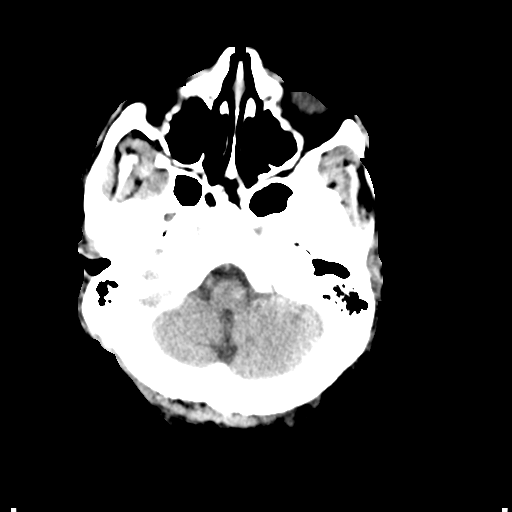
[im 10/36  brain]
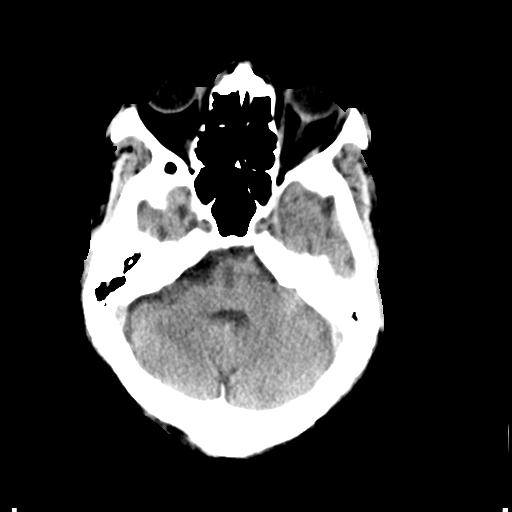
[im 13/36  brain]
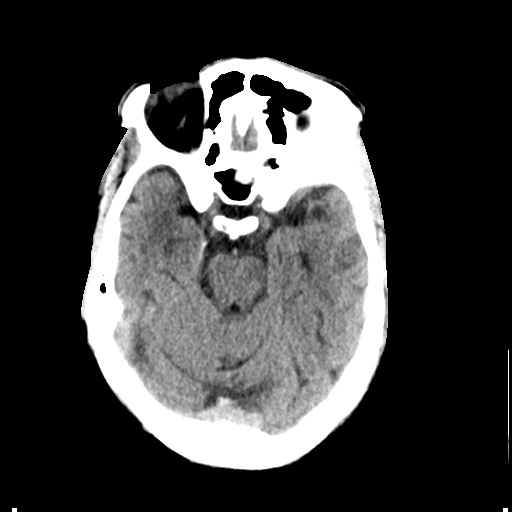
[im 16/36  brain]
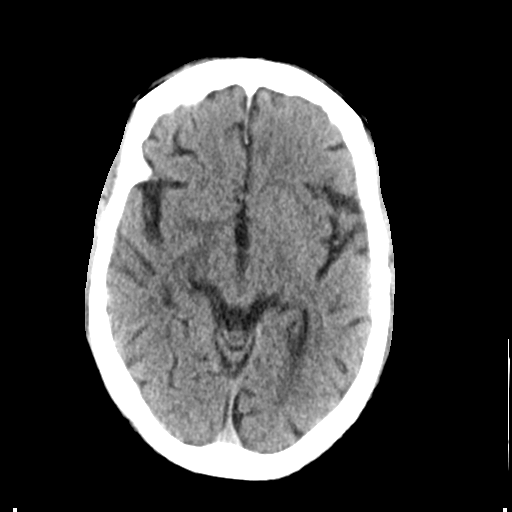
[im 16/36  bone]
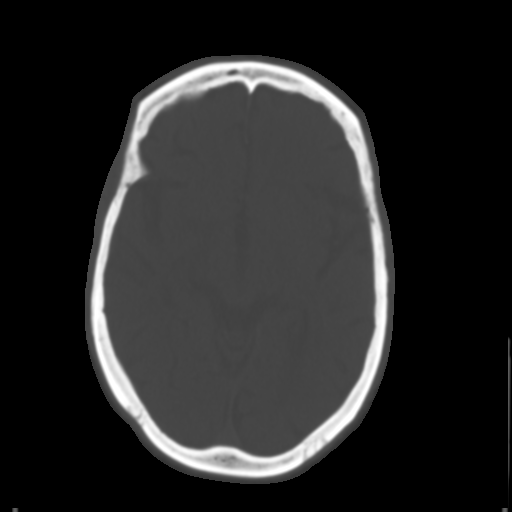
[im 20/36  brain]
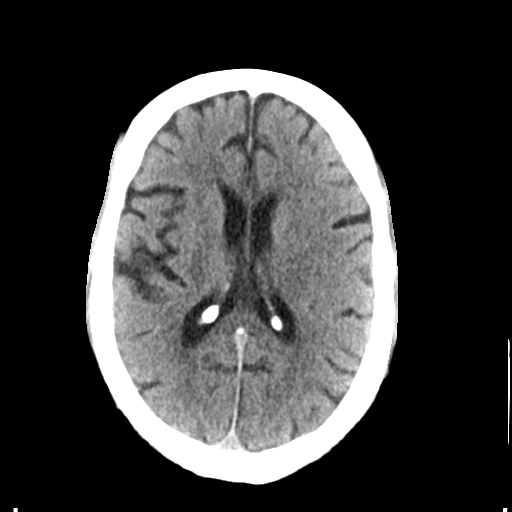
[im 23/36  brain]
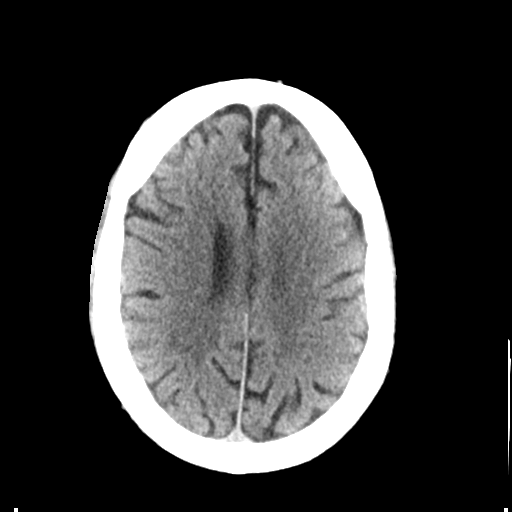
[im 27/36  brain]
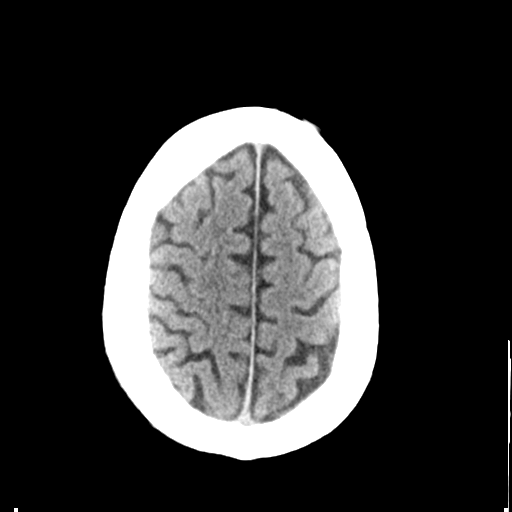
[im 29/36  brain]
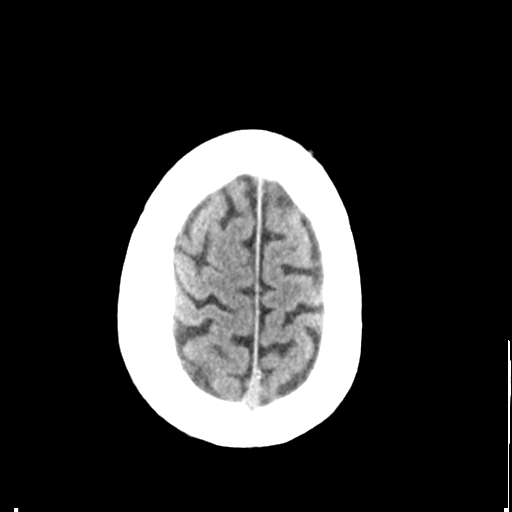
[im 29/36  bone]
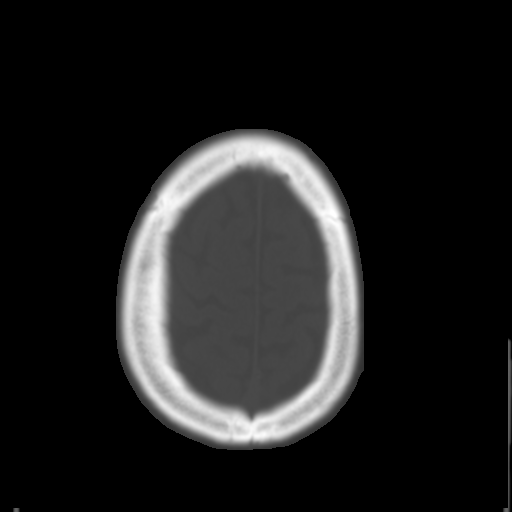
[im 33/36  brain]
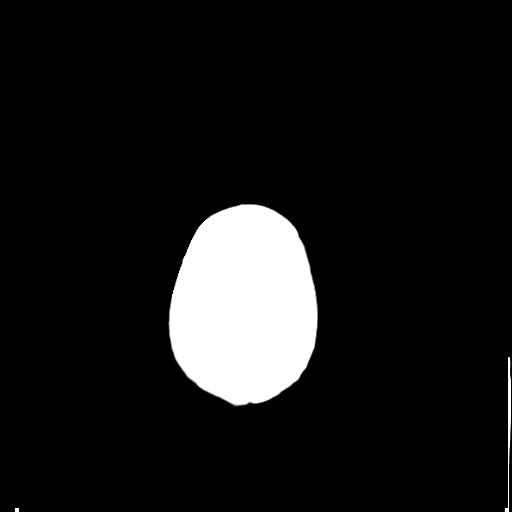

[Series 5: head 3.0 mpr cor · coronal · 0.35mm/px · 3 of 70 slices shown]
[im 24/70  brain]
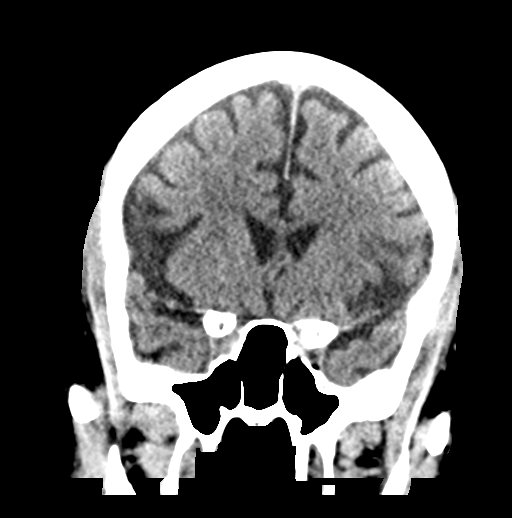
[im 31/70  brain]
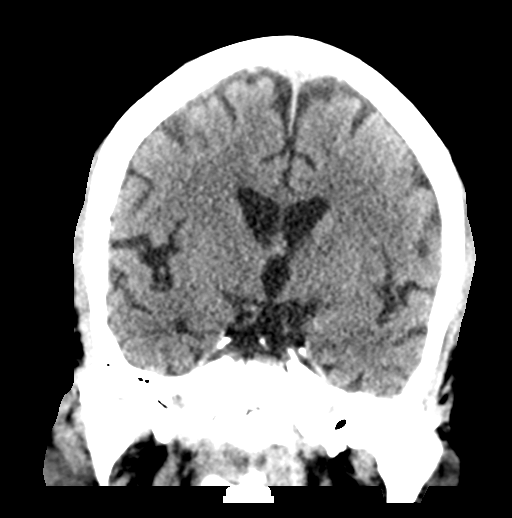
[im 39/70  brain]
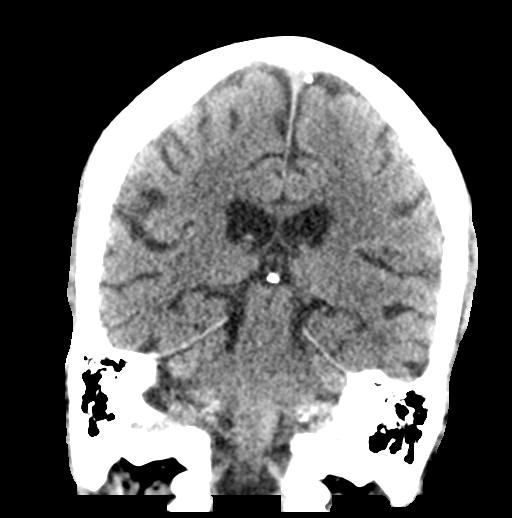

[Series 6: head 3.0 mpr sag · sagittal · 0.35mm/px · 3 of 67 slices shown]
[im 23/67  brain]
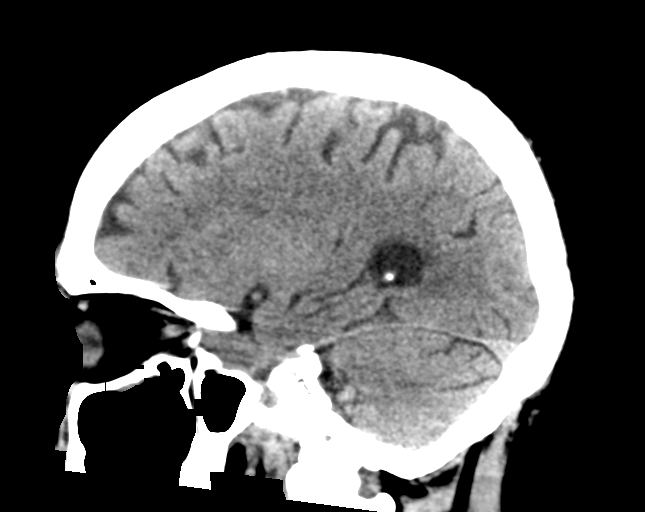
[im 34/67  brain]
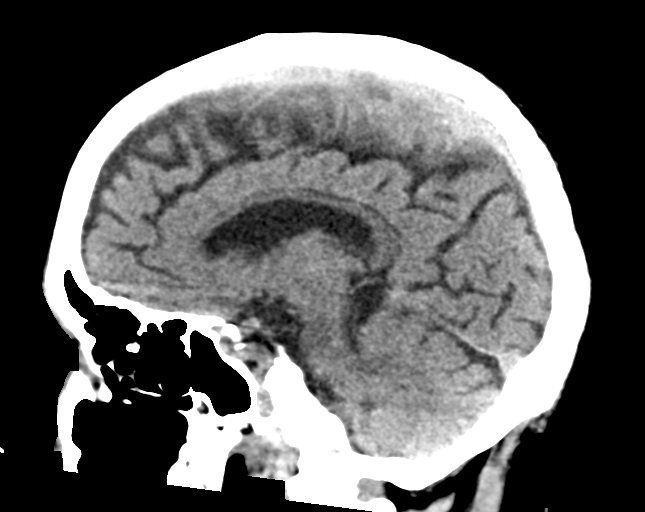
[im 45/67  brain]
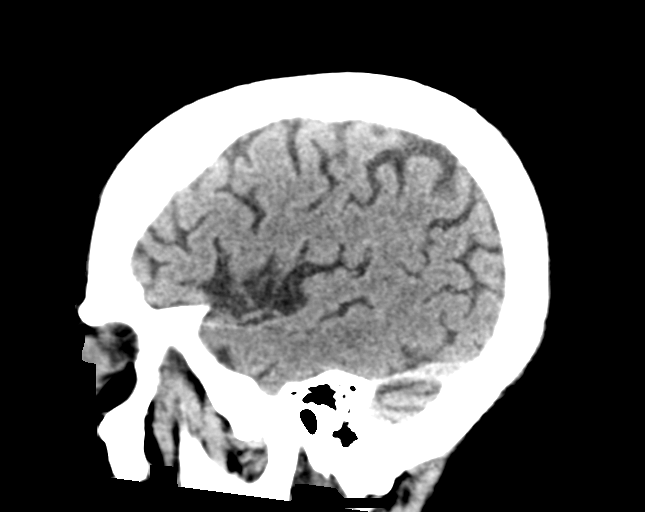

[16 of 47 positions shown; findings below may reference images not displayed]

FINDINGS: Brain: Age related volume loss. No acute intracranial abnormality.
Specifically, no hemorrhage, hydrocephalus, mass lesion, acute
infarction, or significant intracranial injury.

Vascular: No hyperdense vessel or unexpected calcification.

Skull: No acute calvarial abnormality.

Sinuses/Orbits: Mucosal thickening in the left maxillary sinus. No
acute findings.

Other: None
IMPRESSION: No acute intracranial abnormality.

## 2021-03-04 NOTE — Progress Notes (Signed)
Location:  [facility] Room Number: [place] of Service:  SNF (31) Provider:  Durenda Age, DNP, FNP-BC  Patient Care Team: [name], [name], NP as PCP - General (Family Medicine) Martinique, Peter M, MD as PCP - Cardiology (Cardiology) Larey Dresser, MD as PCP - Advanced Heart Failure (Cardiology) Elmarie Shiley, MD (Nephrology) Milus Banister, MD (Gastroenterology) Jorge Ny, LCSW as Social Worker (Licensed Clinical Social Worker)  Extended Emergency Contact Information Primary Emergency Contact: Erin Phone: [phone] Mobile Phone: [phone] Relation: Sister Secondary Emergency Contact: [facility] Mobile Phone: [phone] Relation: Friend  Code Status:  Full Code  Goals of care: Advanced Directive information Advanced Directives 02/26/2021  Does Patient Have a Medical Advance Directive? No  Type of Advance Directive -  Does patient want to make changes to medical advance directive? -  Copy of [place] in Chart? -  Would patient like information on creating a medical advance directive? No - Patient declined  Pre-existing out of facility DNR order (yellow form or pink MOST form) -     Chief Complaint  Patient presents with   Hospitalization Follow-up    Admission to [facility] 03/01/21    HPI:  Pt is a 76 y.o. male who was admitted to [facility] on 03/01/21 post hospital admission. He has a PMH of CKD3b, hypertension, COPD, celiac disease, BPH, AICD and gout. He presented to the ED with generalized weakness fo 4--5 days. In the ED,  was hemodynamically stable with K 6.1 and creatinine 3.62 which is up from his baseline of 2.15. He was given 2 doses of Lokelma, calcium gluconate and dextrose IV insulin intermittently.Lasix was held and was given IV fluids. On discharge, 03/01/21, K down to 4.6, creatinine down to 2.30.          He was seen in the room today. He was busy  organizing his hospital discharge papers.  Past Medical History:  Diagnosis Date   Anemia    Anxiety    Benign paroxysmal positional vertigo    Bipolar I disorder, most recent episode (or current) unspecified    Celiac disease    CHF (congestive heart failure) (HCC)    Chronic kidney disease, stage III (moderate) (HCC)    Crohn's    Remicade q8 weeks   Depression    Essential and other specified forms of tremor    medication-induced Parkinson's, now resolved   Essential hypertension, benign    Gout 2018   Heart failure ([place])    Hypertrophy of prostate without urinary obstruction and other lower urinary tract symptoms (LUTS)    Impotence of organic origin    Insomnia with sleep apnea, unspecified    Iron deficiency anemia, unspecified    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   Neuralgia, neuritis, and radiculitis, unspecified    Other B-complex deficiencies    Other extrapyramidal disease and abnormal movement disorder    Postinflammatory pulmonary fibrosis ([place])    Tobacco use disorder    Vertigo 2018   Past Surgical History:  Procedure Laterality Date   CATARACT EXTRACTION, BILATERAL Bilateral 09/2018   CHOLECYSTECTOMY  07-12-2010   ICD IMPLANT N/A 01/02/2020   Procedure: ICD IMPLANT;  Surgeon: Evans Lance, MD;  Location: [place] [facility] CV LAB;  Service: Cardiovascular;  Laterality: N/A;   RIGHT HEART CATH N/A 10/12/2018   Procedure: RIGHT HEART CATH;  Surgeon: Jolaine Artist, MD;  Location: [facility];  Service: Cardiovascular;  Laterality: N/A;  RIGHT/LEFT HEART CATH AND CORONARY ANGIOGRAPHY N/A 04/26/2019   Procedure: RIGHT/LEFT HEART CATH AND CORONARY ANGIOGRAPHY;  Surgeon: Larey Dresser, MD;  Location: [facility] [facility] LAB;  Service: Cardiovascular;  Laterality: N/A;   SMALL INTESTINE SURGERY     x 2    Allergies  Allergen Reactions   Azathioprine Other (See Comments)    REACTION: affected WBC "Almost died"   Ciprofloxacin Other (See Comments)    Reaction not  recalled   Levaquin [Levofloxacin In D5w] Other (See Comments)    Reaction not rec   Plendil [Felodipine] Other (See Comments)    Reaction not not recalled    Outpatient Encounter Medications as of 03/04/2021  Medication Sig   acetaminophen (TYLENOL) 160 MG/5ML liquid Take 10.2 mLs (325 mg total) by mouth every 6 (six) hours as needed for fever or pain.   allopurinol (ZYLOPRIM) 100 MG tablet TAKE 2 TABLETS(200 MG) BY MOUTH DAILY (Patient taking differently: Take 200 mg by mouth daily.)   apixaban (ELIQUIS) 5 MG TABS tablet Take 1 tablet (5 mg total) by mouth 2 (two) times daily.   budesonide (ENTOCORT EC) 3 MG 24 hr capsule Take 3 capsules (9 mg total) by mouth daily.   Calcium 500 MG tablet Take 1 tablet (500 mg total) by mouth 1 day or 1 dose for 1 dose. (Patient taking differently: Take 500 mg by mouth daily.)   Cholecalciferol (VITAMIN D) 125 MCG (5000 UT) CAPS Take 5,000 Units by mouth daily.   dapagliflozin propanediol (FARXIGA) 10 MG TABS tablet Take 1 tablet (10 mg total) by mouth daily before breakfast.   divalproex (DEPAKOTE ER) 500 MG 24 hr tablet TAKE 3 TABLETS(1500 MG) BY MOUTH AT BEDTIME (Patient taking differently: Take 1,500 mg by mouth at bedtime.)   epoetin alfa (EPOGEN) 10000 UNIT/ML injection 10,000 Units every 30 (thirty) days.   feeding supplement (ENSURE ENLIVE / ENSURE PLUS) LIQD Take 237 mLs by mouth 2 (two) times daily between meals.   ferrous sulfate 325 (65 FE) MG tablet Take 1 tablet (325 mg total) by mouth in the morning and at bedtime.   folic acid (FOLVITE) 1 MG tablet Take 1 tablet (1 mg total) by mouth daily.   furosemide (LASIX) 20 MG tablet Take 1 tablet (20 mg total) by mouth daily.   inFLIXimab (REMICADE) 100 MG injection Infuse Remicade IV schedule 1 55m/kg every 8 weeks Premedicate with Tylenol 500-6547mby mouth and Benadryl 25-502my mouth prior to infusion. Last PPD was on 12/2009.  (Patient taking differently: Inject 1.5 mg/kg into the vein every 2  (two) months. Infuse Remicade IV schedule 1 5mg26m every 8 weeks Premedicate with Tylenol 500-650mg6mmouth and Benadryl 25-50mg 16mouth prior to infusion. Last PPD was on 12/2009.)   isosorbide mononitrate (IMDUR) 60 MG 24 hr tablet Take 1 tablet (60 mg total) by mouth daily.   ivabradine (CORLANOR) 5 MG TABS tablet TAKE 1 TABLET(5 MG) BY MOUTH TWICE DAILY WITH A MEAL (Patient taking differently: Take 5 mg by mouth 2 (two) times daily with a meal.)   loperamide (IMODIUM) 2 MG capsule Take 1 capsule (2 mg total) by mouth 3 (three) times daily as needed for diarrhea or loose stools.   magnesium oxide (MAG-OX) 400 MG tablet Take 400 mg by mouth in the morning, at noon, and at bedtime.   metoprolol succinate (TOPROL-XL) 25 MG 24 hr tablet Take 1.5 tablets (37.5 mg total) by mouth in the morning and at bedtime.   mirtazapine (REMERON)  7.5 MG tablet TAKE 1 TABLET(7.5 MG) BY MOUTH AT BEDTIME (Patient taking differently: Take 7.5 mg by mouth at bedtime.)   Multiple Vitamin (MULTIVITAMIN WITH MINERALS) TABS tablet Take 1 tablet by mouth daily.   omeprazole (PRILOSEC) 20 MG capsule Take 1 capsule (20 mg total) by mouth daily.   QUEtiapine (SEROQUEL) 100 MG tablet Take 1.5 tablets (150 mg total) by mouth at bedtime.   solifenacin (VESICARE) 5 MG tablet TAKE 1 TABLET(5 MG) BY MOUTH DAILY (Patient taking differently: Take 5 mg by mouth daily.)   spironolactone (ALDACTONE) 25 MG tablet Take 0.5 tablets (12.5 mg total) by mouth daily.   tamsulosin (FLOMAX) 0.4 MG CAPS capsule Take 1 capsule (0.4 mg total) by mouth daily.   vitamin B-12 (CYANOCOBALAMIN) 500 MCG tablet Take 1,000 mcg by mouth daily.    No facility-administered encounter medications on file as of 03/04/2021.    Review of Systems  Constitutional:  Negative for appetite change and fever.  HENT:  Negative for sore throat.   Eyes: Negative.   Cardiovascular:  Negative for chest pain and leg swelling.  Gastrointestinal:  Negative for abdominal  distention, diarrhea and vomiting.  Genitourinary:  Negative for dysuria, frequency and urgency.  Skin:  Negative for color change.  Neurological:  Negative for dizziness and headaches.  Psychiatric/Behavioral:  Negative for behavioral problems and sleep disturbance. The patient is not nervous/anxious.       Immunization History  Administered Date(s) Administered   Fluad Quad(high Dose 65+) 11/15/2018, 11/17/2019, 11/19/2020   H1N1 02/07/2008   Influenza, High Dose Seasonal PF 11/26/2016, 12/15/2017   Influenza, Seasonal, Injecte, Preservative Fre 12/12/2008, 11/07/2009, 11/04/2010   Influenza,inj,Quad PF,6+ Mos 11/13/2014, 01/03/2016   Influenza-Unspecified 01/11/1997, 12/15/1997, 03/02/2000, 01/02/2003, 12/18/2003, 12/30/2004, 12/10/2005, 12/25/2005, 01/11/2007, 02/07/2008, 12/08/2011, 10/25/2012, 12/16/2012, 12/09/2013, 11/28/2014, 12/31/2015, 11/24/2016, 10/29/2017, 11/24/2017, 12/15/2017, 11/25/2018   Moderna Covid-19 Vaccine Bivalent Booster 30yr & up 11/19/2020   Moderna Sars-Covid-2 Vaccination 04/20/2019, 05/18/2019, 10/25/2019   PPD Test 12/30/2010, 01/05/2012, 01/07/2013, 01/13/2014   Pneumococcal Conjugate-13 01/03/2014, 08/10/2014   Pneumococcal Polysaccharide-23 03/02/2000, 03/05/2004, 03/27/2005, 01/09/2015, 01/03/2016, 05/06/2018   Pneumococcal-Unspecified 12/25/2005   Tdap 05/12/2011, 05/26/2011   Zoster Recombinat (Shingrix) 05/13/2018, 04/01/2019   Pertinent  Health Maintenance Due  Topic Date Due   URINE MICROALBUMIN  Never done   FOOT EXAM  08/09/2020   OPHTHALMOLOGY EXAM  06/28/2021   HEMOGLOBIN A1C  08/26/2021   COLONOSCOPY (Pts 45-436yrInsurance coverage will need to be confirmed)  09/17/2027   INFLUENZA VACCINE  Completed   Fall Risk 02/27/2021 02/27/2021 02/28/2021 03/01/2021 03/01/2021  Falls in the past year? - - - - -  Was there an injury with Fall? - - - - -  Fall Risk Category Calculator - - - - -  Fall Risk Category - - - - -  Patient Fall Risk Level  High fall risk High fall risk High fall risk High fall risk High fall risk  Patient at Risk for Falls Due to - - - - -  Patient at Risk for Falls Due to - - - - -  Fall risk Follow up - - - - -     Vitals:   03/04/21 1419  BP: (!) 145/90  Pulse: 88  Temp: 97.7 F (36.5 C)  Weight: 176 lb 9.4 oz (80.1 kg)  Height: 6' 4"  (1.93 m)   Body mass index is 21.5 kg/m.  Physical Exam Constitutional:      Appearance: Normal appearance.  HENT:     Head: Normocephalic  and atraumatic.     Mouth/Throat:     Mouth: Mucous membranes are moist.  Eyes:     Conjunctiva/sclera: Conjunctivae normal.  Cardiovascular:     Rate and Rhythm: Normal rate and regular rhythm.     Pulses: Normal pulses.     Heart sounds: Normal heart sounds.  Pulmonary:     Effort: Pulmonary effort is normal.     Breath sounds: Normal breath sounds.  Abdominal:     General: Bowel sounds are normal.     Palpations: Abdomen is soft.  Musculoskeletal:        General: No swelling. Normal range of motion.     Cervical back: Normal range of motion.  Skin:    General: Skin is warm and dry.  Neurological:     General: No focal deficit present.     Mental Status: He is alert and oriented to person, place, and time.  Psychiatric:        Mood and Affect: Mood normal.        Behavior: Behavior normal.        Thought Content: Thought content normal.        Judgment: Judgment normal.      Labs reviewed: Recent Labs    10/10/20 0154 10/11/20 0342 10/12/20 0650 02/25/21 1754 02/26/21 0319 02/27/21 0310 02/27/21 1353 02/28/21 0255 02/28/21 1432 03/01/21 0708  NA 142 144   < >  --    < > 134*  --  138  --  137  K 3.9 3.9   < >  --    < > 5.7*   < > 5.5* 4.9 4.6  CL 114* 109   < >  --    < > 102  --  111  --  111  CO2 22 25   < >  --    < > 27  --  23  --  20*  GLUCOSE 119* 101*   < >  --    < > 113*  --  97  --  80  BUN 22 19   < >  --    < > 36*  --  35*  --  33*  CREATININE 1.81* 1.92*   < >  --    < >  3.01*  --  2.81*  --  2.30*  CALCIUM 8.5* 8.9   < >  --    < > 8.9  --  8.6*  --  8.4*  MG 1.4* 2.0   < > 2.2  --  1.8  --  1.7  --   --   PHOS 1.6* 3.8  --   --   --   --   --   --   --   --    < > = values in this interval not displayed.   Recent Labs    10/10/20 0154 10/31/20 1945 11/01/20 0245 11/06/20 1220  AST 11* 25 23 11   ALT 9 26 27 11   ALKPHOS 49 81 80  --   BILITOT 0.7 0.9 0.8 0.3  PROT 5.8* 6.0* 6.7 6.7  ALBUMIN 2.2* 2.5* 2.6*  --    Recent Labs    11/01/20 0245 11/02/20 0442 11/06/20 1220 11/14/20 1900 02/26/21 0319 02/27/21 0310 02/28/21 0255  WBC 29.6*   < > 10.8   < > 10.7* 8.6 9.7  NEUTROABS 27.2*  --  7,722  --   --  5.9  --   HGB  9.7*   < > 9.2*   < > 11.1* 10.5* 9.8*  HCT 31.1*   < > 28.4*   < > 36.2* 33.0* 31.2*  MCV 110.7*   < > 103.3*   < > 111.7* 107.8* 109.9*  PLT 141*   < > 147   < > 146* 145* 155   < > = values in this interval not displayed.   Lab Results  Component Value Date   TSH 1.183 11/01/2020   Lab Results  Component Value Date   HGBA1C 4.9 02/26/2021   Lab Results  Component Value Date   CHOL 161 10/20/2019   HDL 54 10/20/2019   LDLCALC 86 10/20/2019   TRIG 112 10/20/2019   CHOLHDL 3.0 10/20/2019    Significant Diagnostic Results in last 30 days:  CT Head Wo Contrast  Result Date: 02/25/2021 CLINICAL DATA:  Sudden onset of weakness today resulting in a fall. EXAM: CT HEAD WITHOUT CONTRAST TECHNIQUE: Contiguous axial images were obtained from the base of the skull through the vertex without intravenous contrast. COMPARISON:  Head CT 10/31/2020 and MRI 02/07/2018 FINDINGS: Brain: There is no evidence of an acute infarct, intracranial hemorrhage, mass, midline shift, or extra-axial fluid collection. Hypodensities in the cerebral white matter bilaterally are unchanged and nonspecific but compatible with mild chronic small vessel ischemic disease. Mild cerebral atrophy is unchanged. Vascular: No hyperdense vessel. Skull: No  fracture or suspicious osseous lesion. Sinuses/Orbits: Minimal bilateral ethmoid sinus and left maxillary sinus mucosal thickening. Clear mastoid air cells. Bilateral cataract extraction. Other: None. IMPRESSION: 1. No evidence of acute intracranial abnormality. 2. Mild chronic small vessel ischemic disease. Electronically Signed   By: Logan Bores M.D.   On: 02/25/2021 17:37   DG Chest Portable 1 View  Result Date: 02/25/2021 CLINICAL DATA:  Generalized weakness. 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 Stable normal heart size. Unchanged mediastinal contours. No focal airspace disease, pleural effusion, or pneumothorax. No pulmonary edema. No acute osseous abnormalities are seen. IMPRESSION: No acute chest finding. Electronically Signed   By: Keith Rake M.D.   On: 02/25/2021 21:56    Assessment/Plan  1. Acute kidney injury superimposed on chronic kidney disease ([place]) Lab Results  Component Value Date   CREATININE 2.30 (H) 03/01/2021   -  creatinine 3.62 which is up from his baseline of 2.15.  -  was given IV fluids  2. Hyperkalemia Lab Results  Component Value Date   K 4.6 03/01/2021   -   Received 2 doses of Lokelma and dextrose with IV insulin intermittently  3. Chronic systolic heart failure (HCC) -  euvolemic, continue Corlanor 5 mg 1 tab BID, Lasix 20 mg 1 tab daily, Aldactone 25 mg taking 1/2 tab = 12.5 mg daily and isosorbide mononitrate ER 60 mg 1 tab daily  4. Type 2 diabetes mellitus with stage 3b chronic kidney disease, without long-term current use of insulin (HCC) Lab Results  Component Value Date   HGBA1C 4.9 02/26/2021   -  CBGs 97, 108 -  refused Farxiga this morning -  will continue to monitor CBGs  5. Essential hypertension, benign -   Continue metoprolol succinate ER 25 mg taking 1.5 tab = 37.5 mg from daily  6. Chronic obstructive pulmonary disease, unspecified COPD type ([place]) -  no  wheezing/SOB, stable  7. Generalized weakness -   for PT and OT, for therapeutic strengthening exercises    Family/ staff Communication: Discussed plan  of care with resident and charge nurse  Labs/tests ordered: CBC and BMP    Durenda Age, DNP, MSN, FNP-BC [facility] [phone] (Monday-Friday 8:00 a.m. - 5:00 p.m.) [phone] (after hours)

## 2021-03-05 ENCOUNTER — Telehealth (HOSPITAL_COMMUNITY): Payer: Self-pay

## 2021-03-05 ENCOUNTER — Encounter: Payer: Self-pay | Admitting: Internal Medicine

## 2021-03-05 ENCOUNTER — Non-Acute Institutional Stay (SKILLED_NURSING_FACILITY): Payer: Medicare Other | Admitting: Internal Medicine

## 2021-03-05 DIAGNOSIS — N1832 Chronic kidney disease, stage 3b: Secondary | ICD-10-CM

## 2021-03-05 DIAGNOSIS — R531 Weakness: Secondary | ICD-10-CM | POA: Diagnosis not present

## 2021-03-05 DIAGNOSIS — D539 Nutritional anemia, unspecified: Secondary | ICD-10-CM

## 2021-03-05 DIAGNOSIS — E1122 Type 2 diabetes mellitus with diabetic chronic kidney disease: Secondary | ICD-10-CM

## 2021-03-05 DIAGNOSIS — N179 Acute kidney failure, unspecified: Secondary | ICD-10-CM | POA: Diagnosis not present

## 2021-03-05 NOTE — Telephone Encounter (Signed)
[name] called to inform me he is currently at [facility] until further notice. He reports he may be coming home as soon as 1/17 but unsure at the time. Paramedicine on pause until he returns home. Call complete.

## 2021-03-05 NOTE — Assessment & Plan Note (Signed)
While hospitalized 1/2 - 03/01/2021 for AKI superimposed on CKD with generalized weakness; glucoses ranged from a high of 95 down to 80.  A1c was 4.9%.  Discordance is not suggested.

## 2021-03-05 NOTE — Assessment & Plan Note (Signed)
UpToDate states maximum spironolactone dose is 25 mg a day as long as potassium remains below 5.  If potassium climbs above this level; the dose should be decreased to 12.5 mg daily or every other day.  Renal function potassium will be monitored at SNF.

## 2021-03-05 NOTE — Assessment & Plan Note (Addendum)
He is approximately strong opposition on exam.  Upper extremity stronger than the lower extremities.  Any weakness is minimally asymmetric. PT/OT at SNF as tolerated.

## 2021-03-05 NOTE — Assessment & Plan Note (Addendum)
Despite profound macrocytosis with MCV of 111.9, B12 and folate levels normal. Folate supplement discontinued.

## 2021-03-05 NOTE — Progress Notes (Signed)
NURSING HOME LOCATION:  [facility] ROOM NUMBER:  [id number]  CODE STATUS:  Full Code  PCP:  Marlowe Sax NP  This is a comprehensive admission note to this SNFperformed on this date less than 30 days from date of admission. Included are preadmission medical/surgical history; reconciled medication list; family history; social history and comprehensive review of systems.  Corrections and additions to the records were documented. Comprehensive physical exam was also performed. Additionally a clinical summary was entered for each active diagnosis pertinent to this admission in the Problem List to enhance continuity of care.  HPI: Patient was hospitalized 1/2 - 03/01/2021, presenting to the ED with generalized weakness for 4-5 days.  He was hemodynamically stable but potassium was 6.1 and creatinine was 3.62 with a GFR of 17 indicating AKI on CKD with low stage IV. Baseline creatinine is felt to be 2.15.  Lokelma and calcium gluconate were administered and he was admitted to the Hospitalist service. Clinically he was dehydrated prompting holding furosemide.  Rehydration with IV fluids was pursued. With aggressive intervention potassium normalized and creatinine improved and was 2.3 with GFR 29 indicating high stage 4 CKD prior to discharge. Chronic macrocytic anemia was documented . Initial H/H was 11.8/38.5 with an MCV of 111.9.  Prior to discharge H/H was 9.8/31.2 with an MCV of 109.9.  Despite the profound macrocytosis B12 and folate levels were normal.  Iron levels were mildly decreased with a value of 38. Initial white blood count was 13,300 , but this normalized prior to discharge.  Glucoses rangd from high of 95 down to 80.  A1c was 4.9%.  Albumin was reduced to 2.6 but total protein was normal at 6.7.  PT/OT recommended SNF placement because of generalized weakness.  Initially he declined but subsequently agreed to this plan.  Medical and surgical history: Includes anemia  of chronic anemia, BNPV, bipolar 1 disorder, celiac disease, history of CHF, essential hypertension, history of gout, OSA, postinflammatory pulmonary fibrosis, and CKD. Surgeries and procedures include cholecystectomy, ICD implant, and small intestine surgery x2.  Social history: Nondrinker; former smoker with 49-pack-year history consumption.  Family history: Reviewed  Review of systems: He stated that he was in the hospital because his "potassium was high and I was weak.". He stated that he was ambulating to his car when he became so weak he had to lie on the ground.  Strangers took him to urgent care who sent him to the ED.  He states that he is getting stronger.   He describes intermittent aching in his chest which responds to Tylenol.  He has had watery stool since yesterday and realizes he is taking Imodium.  He has lost 10 pounds since he has been ill.  He denies any other active GI symptoms.  He describes intraoral pain when he tries to wear his dentures.  He was getting Percocet in the hospital for this.  He does describe chronic cold intolerance.  Constitutional: No fever Eyes: No redness, discharge, pain, vision change ENT/mouth: No nasal congestion, purulent discharge, earache, change in hearing, sore throat  Cardiovascular: No  palpitations, paroxysmal nocturnal dyspnea, claudication, edema  Respiratory: No cough, sputum production, hemoptysis, DOE, significant snoring, apnea  Gastrointestinal: No heartburn, dysphagia, abdominal pain, nausea /vomiting, rectal bleeding, melena Genitourinary: No dysuria, hematuria, pyuria, incontinence, nocturia Musculoskeletal: No joint stiffness, joint swelling, weakness, pain Dermatologic: No rash, pruritus, change in appearance of skin Neurologic: No dizziness, headache, syncope, seizures, numbness, tingling Psychiatric: No significant anxiety, depression,  insomnia, anorexia Endocrine: No change in hair/skin/nails, excessive thirst, excessive  hunger, excessive urination  Hematologic/lymphatic: No significant bruising, lymphadenopathy, abnormal bleeding Allergy/immunology: No itchy/watery eyes, significant sneezing, urticaria, angioedema  Physical exam:  Pertinent or positive findings: He appears suboptimally nourished.  Pattern alopecia is present.  Arcus senilis is noted.  He has dental implants and posts to seat the dentures. Heart sounds are distant.  Breath sounds are also decreased.  Defibrillator is present on the left.  He has trace edema at the sock line.  Posterior tibial pulses are stronger than dorsalis pedis pulses.  Upper extremities are stronger than the lower extremities.  Strength is minimally asymmetric and surprisingly good.  General appearance: no acute distress, increased work of breathing is present.   Lymphatic: No lymphadenopathy about the head, neck, axilla. Eyes: No conjunctival inflammation or lid edema is present. There is no scleral icterus. Ears:  External ear exam shows no significant lesions or deformities.   Nose:  External nasal examination shows no deformity or inflammation. Nasal mucosa are pink and moist without lesions, exudates Oral exam: Lips and gums are healthy appearing.There is no oropharyngeal erythema or exudate. Neck:  No thyromegaly, masses, tenderness noted.    Heart:  No gallop, murmur, click, rub.  Lungs: without wheezes, rhonchi, rales, rubs. Abdomen: Bowel sounds are normal.  Abdomen is soft and nontender with no organomegaly, hernias, masses. GU: Deferred  Extremities:  No cyanosis, clubbing. Neurologic exam:  Balance, Rhomberg, finger to nose testing could not be completed due to clinical state Skin: Warm & dry w/o tenting. No significant lesions or rash.  See clinical summary under each active problem in the Problem List with associated updated therapeutic plan

## 2021-03-05 NOTE — Patient Instructions (Signed)
See assessment and plan under each diagnosis in the problem list and acutely for this visit 

## 2021-03-06 ENCOUNTER — Other Ambulatory Visit: Payer: Self-pay | Admitting: *Deleted

## 2021-03-06 NOTE — Patient Outreach (Addendum)
Per [place] eligible member resides in [facility].  Screened for potential [facility] Care Management services as a benefit of member's insurance plan.  [name] admitted to SNF on 03/01/21 after hospitalization.  Spoke with [name] in his room at [facility] to discuss transition plans and [facility] Care Management services.   [name] endorses he lives alone but has support and assistance of his home infusion nurse [name] and her niece. Reports transition plan is to return home.   [name] reports he drove prior to admission but states he will likely not drive post SNF. States he will likely need transportation assistance. States the niece will not be able to take him to MD appointments because "she has a job." Discussed his insurance [employer] provides transportation assistance. Discussed Probation officer will follow back up with him regarding the telephone to call for transportation services with [facility] Medicare.   Discussed the need for post hospitalization/SNF meals that are a benefit of his insurance as well. [name] states he does not want [facility] meals. States "it is nasty." Reports he is on the wait list for meals on wheels. Declines writer following up on his behalf for the [facility] meals program.   Member also reports he is in the HF Paramedicine program.  Explained [facility] services. Explained [place] Management will not interfere or replace home health or the other services he has in place. [name] is agreeable and verbal consent obtained. Provided [facility] Care Management folder with 24-hr nurse advice line magnet and writer's contact information.   Confirmed PCP is Advice worker with [employer].  Confirmed best contact number for [name] is [phone].  Will send notification to SNF SW that writer is following for transition date and home health arrangements. Will make referral to [facility] for complex case management services.   Mr Brent [name] has  medical history of CKD 3B, HTN, COPD, HFrEF, celiac disease, BPH, gout.  Will plan to refer to [facility] upon SNF transition. Will continue to follow while member remains in SNF.   Marthenia Rolling, MSN, RN,BSN [place] Acute Care Coordinator [phone] [facility]) [phone]  (Toll free office)

## 2021-03-07 ENCOUNTER — Other Ambulatory Visit: Payer: Self-pay | Admitting: *Deleted

## 2021-03-07 NOTE — Patient Outreach (Addendum)
[place] Coordinator follow up. [name] resides in [facility].   Telephone call made to [name] to provide [facility] Medicare customer service telephone number to call for transportation to MD appointments if and when needed. He previously indicated to Probation officer that transportation may be an issue post SNF.   [name] also reports he may transition home tomorrow but he is appealing discharge. Discussed that writer will plan to make [facility] Management RNCM referral when he leaves SNF.   Writer sent communication to facility SW to inquire about home health arrangements.   Will continue to follow for transition date and will refer to [facility] for complex case management services.  [place] reports [name] will have [facility] services post SNF.   Marthenia Rolling, MSN, RN,BSN [place] Acute Care Coordinator [phone] [facility]) [phone]  (Toll free office)

## 2021-03-08 ENCOUNTER — Telehealth: Payer: Self-pay

## 2021-03-08 LAB — BASIC METABOLIC PANEL
BUN: 27 — AB (ref 4–21)
CO2: 22 (ref 13–22)
Chloride: 112 — AB (ref 99–108)
Creatinine: 1.8 — AB (ref 0.6–1.3)
Glucose: 74
Potassium: 4.3 (ref 3.4–5.3)
Sodium: 144 (ref 137–147)

## 2021-03-08 LAB — CBC AND DIFFERENTIAL
HCT: 33 — AB (ref 41–53)
Hemoglobin: 10.2 — AB (ref 13.5–17.5)
Platelets: 170 (ref 150–399)
WBC: 9.3

## 2021-03-08 LAB — COMPREHENSIVE METABOLIC PANEL: Calcium: 8.4 — AB (ref 8.7–10.7)

## 2021-03-08 LAB — CBC: RBC: 3.1 — AB (ref 3.87–5.11)

## 2021-03-08 NOTE — Progress Notes (Deleted)
Cardiology Office Note   Date:  03/08/2021   ID:  Brent [name], Brent [name] [birth year], MRN [id number]  PCP:  Sandrea Hughs, NP  Cardiologist:   Dewanda Fennema Martinique, MD   No chief complaint on file.     History of Present Illness: Brent [name] is a 76 y.o. male who presents for ***    Past Medical History:  Diagnosis Date   Anemia    Anxiety    Benign paroxysmal positional vertigo    Bipolar I disorder, most recent episode (or current) unspecified    Celiac disease    CHF (congestive heart failure) (HCC)    Chronic kidney disease, stage III (moderate) (HCC)    Crohn's    Remicade q8 weeks   Depression    Essential and other specified forms of tremor    medication-induced Parkinson's, now resolved   Essential hypertension, benign    Gout 2018   Heart failure ([place])    Hypertrophy of prostate without urinary obstruction and other lower urinary tract symptoms (LUTS)    Impotence of organic origin    Insomnia with sleep apnea, unspecified    Iron deficiency anemia, unspecified    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   Neuralgia, neuritis, and radiculitis, unspecified    Other B-complex deficiencies    Other extrapyramidal disease and abnormal movement disorder    Postinflammatory pulmonary fibrosis ([place])    Tobacco use disorder    Vertigo 2018    Past Surgical History:  Procedure Laterality Date   CATARACT EXTRACTION, BILATERAL Bilateral 09/2018   CHOLECYSTECTOMY  07-12-2010   ICD IMPLANT N/A 01/02/2020   Procedure: ICD IMPLANT;  Surgeon: Evans Lance, MD;  Location: [facility];  Service: Cardiovascular;  Laterality: N/A;   RIGHT HEART CATH N/A 10/12/2018   Procedure: RIGHT HEART CATH;  Surgeon: Jolaine Artist, MD;  Location: [facility];  Service: Cardiovascular;  Laterality: N/A;   RIGHT/LEFT HEART CATH AND CORONARY ANGIOGRAPHY N/A 04/26/2019   Procedure: RIGHT/LEFT HEART CATH AND CORONARY ANGIOGRAPHY;  Surgeon: Larey Dresser, MD;  Location: [place] [facility];   Service: Cardiovascular;  Laterality: N/A;   SMALL INTESTINE SURGERY     x 2     Current Outpatient Medications  Medication Sig Dispense Refill   acetaminophen (TYLENOL) 160 MG/5ML liquid Take 10.2 mLs (325 mg total) by mouth every 6 (six) hours as needed for fever or pain.     allopurinol (ZYLOPRIM) 100 MG tablet TAKE 2 TABLETS(200 MG) BY MOUTH DAILY (Patient taking differently: Take 200 mg by mouth daily.) 180 tablet 1   apixaban (ELIQUIS) 5 MG TABS tablet Take 1 tablet (5 mg total) by mouth 2 (two) times daily. 60 tablet 0   budesonide (ENTOCORT EC) 3 MG 24 hr capsule Take 3 capsules (9 mg total) by mouth daily. 30 capsule 0   Calcium 500 MG tablet Take 1 tablet (500 mg total) by mouth 1 day or 1 dose for 1 dose. (Patient taking differently: Take 500 mg by mouth daily.) 30 tablet    Cholecalciferol (VITAMIN D) 125 MCG (5000 UT) CAPS Take 5,000 Units by mouth daily. 30 capsule 0   dapagliflozin propanediol (FARXIGA) 10 MG TABS tablet Take 1 tablet (10 mg total) by mouth daily before breakfast. 30 tablet 0   divalproex (DEPAKOTE ER) 500 MG 24 hr tablet TAKE 3 TABLETS(1500 MG) BY MOUTH AT BEDTIME (Patient taking differently: Take 1,500 mg by mouth at bedtime.) 270 tablet 1   epoetin  alfa (EPOGEN) 10000 UNIT/ML injection 10,000 Units every 30 (thirty) days.     feeding supplement (ENSURE ENLIVE / ENSURE PLUS) LIQD Take 237 mLs by mouth 2 (two) times daily between meals.     ferrous sulfate 325 (65 FE) MG tablet Take 1 tablet (325 mg total) by mouth in the morning and at bedtime. 30 tablet 0   folic acid (FOLVITE) 1 MG tablet Take 1 tablet (1 mg total) by mouth daily. 30 tablet 0   furosemide (LASIX) 20 MG tablet Take 1 tablet (20 mg total) by mouth daily. 30 tablet 0   inFLIXimab (REMICADE) 100 MG injection Infuse Remicade IV schedule 1 15m/kg every 8 weeks Premedicate with Tylenol 500-6590mby mouth and Benadryl 25-5065my mouth prior to infusion. Last PPD was on 12/2009.  (Patient taking  differently: Inject 1.5 mg/kg into the vein every 2 (two) months. Infuse Remicade IV schedule 1 5mg41m every 8 weeks Premedicate with Tylenol 500-650mg39mmouth and Benadryl 25-50mg 84mouth prior to infusion. Last PPD was on 12/2009.) 1 each 6   isosorbide mononitrate (IMDUR) 60 MG 24 hr tablet Take 1 tablet (60 mg total) by mouth daily. 30 tablet 0   ivabradine (CORLANOR) 5 MG TABS tablet TAKE 1 TABLET(5 MG) BY MOUTH TWICE DAILY WITH A MEAL (Patient taking differently: Take 5 mg by mouth 2 (two) times daily with a meal.) 60 tablet 6   loperamide (IMODIUM) 2 MG capsule Take 1 capsule (2 mg total) by mouth 3 (three) times daily as needed for diarrhea or loose stools. 30 capsule 0   magnesium oxide (MAG-OX) 400 MG tablet Take 400 mg by mouth in the morning, at noon, and at bedtime.     metoprolol succinate (TOPROL-XL) 25 MG 24 hr tablet Take 1.5 tablets (37.5 mg total) by mouth in the morning and at bedtime. 90 tablet 6   mirtazapine (REMERON) 7.5 MG tablet TAKE 1 TABLET(7.5 MG) BY MOUTH AT BEDTIME (Patient taking differently: Take 7.5 mg by mouth at bedtime.) 30 tablet 3   Multiple Vitamin (MULTIVITAMIN WITH MINERALS) TABS tablet Take 1 tablet by mouth daily.     omeprazole (PRILOSEC) 20 MG capsule Take 1 capsule (20 mg total) by mouth daily. 30 capsule 0   QUEtiapine (SEROQUEL) 100 MG tablet Take 1.5 tablets (150 mg total) by mouth at bedtime. 45 tablet 5   solifenacin (VESICARE) 5 MG tablet TAKE 1 TABLET(5 MG) BY MOUTH DAILY (Patient taking differently: Take 5 mg by mouth daily.) 90 tablet 1   spironolactone (ALDACTONE) 25 MG tablet Take 0.5 tablets (12.5 mg total) by mouth daily. 45 tablet 3   tamsulosin (FLOMAX) 0.4 MG CAPS capsule Take 1 capsule (0.4 mg total) by mouth daily. 30 capsule 0   vitamin B-12 (CYANOCOBALAMIN) 500 MCG tablet Take 1,000 mcg by mouth daily.      No current facility-administered medications for this visit.    Allergies:   Azathioprine, Ciprofloxacin, Levaquin  [levofloxacin in d5w], and Plendil [felodipine]    Social History:  The patient  reports that he quit smoking about 6 years ago. His smoking use included cigarettes. He started smoking about 64 years ago. He has a 49.00 pack-year smoking history. He has never used smokeless tobacco. He reports that he does not drink alcohol and does not use drugs.   Family History:  The patient's ***family history includes Diabetes in his mother; Emphysema in his father; Pneumonia in his maternal grandmother; Uterine cancer in his mother.    ROS:  Please see the history of present illness.   Otherwise, review of systems are positive for {NONE DEFAULTED:18576}.   All other systems are reviewed and negative.    PHYSICAL EXAM: VS:  There were no vitals taken for this visit. , BMI There is no height or weight on file to calculate BMI. GEN: Well nourished, well developed, in no acute distress HEENT: normal Neck: no JVD, carotid bruits, or masses Cardiac: ***RRR; no murmurs, rubs, or gallops,no edema  Respiratory:  clear to auscultation bilaterally, normal work of breathing GI: soft, nontender, nondistended, + BS MS: no deformity or atrophy Skin: warm and dry, no rash Neuro:  Strength and sensation are intact Psych: euthymic mood, full affect   EKG:  EKG {ACTION; IS/IS OZY:24825003} ordered today. The ekg ordered today demonstrates ***   Recent Labs: 11/01/2020: TSH 1.183 11/06/2020: ALT 11 12/26/2020: B Natriuretic Peptide 345.3 02/28/2021: Hemoglobin 9.8; Magnesium 1.7; Platelets 155 03/01/2021: BUN 33; Creatinine, Ser 2.30; Potassium 4.6; Sodium 137    Lipid Panel    Component Value Date/Time   CHOL 161 10/20/2019 1158   CHOL 137 08/10/2015 1322   TRIG 112 10/20/2019 1158   HDL 54 10/20/2019 1158   HDL 52 08/10/2015 1322   CHOLHDL 3.0 10/20/2019 1158   VLDL 22 04/19/2019 1041   LDLCALC 86 10/20/2019 1158      Wt Readings from Last 3 Encounters:  03/04/21 176 lb 9.4 oz (80.1 kg)  03/01/21 176  lb 9.4 oz (80.1 kg)  02/13/21 161 lb 9.6 oz (73.3 kg)      Other studies Reviewed: Additional studies/ records that were reviewed today include: ***. Review of the above records demonstrates: ***   ASSESSMENT AND PLAN:  1.  ***   Current medicines are reviewed at length with the patient today.  The patient {ACTIONS; HAS/DOES NOT HAVE:19233} concerns regarding medicines.  The following changes have been made:  {PLAN; NO CHANGE:13088:s}  Labs/ tests ordered today include: *** No orders of the defined types were placed in this encounter.        Disposition:   FU with *** in {gen number 7-04:888916} {Days to years:10300}  Signed, Mamoudou Mulvehill Martinique, MD  03/08/2021 9:47 AM    [facility] [address], [place], [place], [address] Phone [phone], Fax [phone]

## 2021-03-08 NOTE — Telephone Encounter (Signed)
Called patient left message on personal voice mail appointment with Dr.Jordan 03/11/21 has been cancelled.Dr.Jordan has never seen you,you are followed in CHF clinic. Advised to keep appointment already scheduled for a echo 2/1 at 11:15 am and appointment with Dr.McLean 2/1 at 12:00 noon.Advised to call back if you have any questions.

## 2021-03-11 ENCOUNTER — Ambulatory Visit: Payer: Medicare Other | Admitting: Cardiology

## 2021-03-11 ENCOUNTER — Other Ambulatory Visit: Payer: Self-pay | Admitting: *Deleted

## 2021-03-11 DIAGNOSIS — R1312 Dysphagia, oropharyngeal phase: Secondary | ICD-10-CM | POA: Diagnosis not present

## 2021-03-11 DIAGNOSIS — M6281 Muscle weakness (generalized): Secondary | ICD-10-CM | POA: Diagnosis not present

## 2021-03-11 DIAGNOSIS — J449 Chronic obstructive pulmonary disease, unspecified: Secondary | ICD-10-CM | POA: Diagnosis not present

## 2021-03-11 DIAGNOSIS — R2681 Unsteadiness on feet: Secondary | ICD-10-CM | POA: Diagnosis not present

## 2021-03-11 NOTE — Patient Outreach (Signed)
[place] Coordinator follow up. [facility] eligible member screened for potential [facility] Care Management services as a benefit of member's insurance plan.  Telephone call made to [name] who confirms he remains in [facility].   Will continue to follow for transition date while [name] resides in SNF. Will make appropriate [facility] Care Management referral upon SNF discharge.    Marthenia Rolling, MSN, RN,BSN [place] Acute Care Coordinator [phone] [facility]) [phone]  (Toll free office)

## 2021-03-12 ENCOUNTER — Other Ambulatory Visit: Payer: Self-pay | Admitting: Adult Health

## 2021-03-12 ENCOUNTER — Non-Acute Institutional Stay (SKILLED_NURSING_FACILITY): Payer: Medicare Other | Admitting: Adult Health

## 2021-03-12 ENCOUNTER — Encounter: Payer: Self-pay | Admitting: Adult Health

## 2021-03-12 ENCOUNTER — Ambulatory Visit: Payer: Medicare Other | Admitting: Family

## 2021-03-12 DIAGNOSIS — D539 Nutritional anemia, unspecified: Secondary | ICD-10-CM | POA: Diagnosis not present

## 2021-03-12 DIAGNOSIS — I5022 Chronic systolic (congestive) heart failure: Secondary | ICD-10-CM

## 2021-03-12 DIAGNOSIS — K219 Gastro-esophageal reflux disease without esophagitis: Secondary | ICD-10-CM

## 2021-03-12 DIAGNOSIS — M1A9XX0 Chronic gout, unspecified, without tophus (tophi): Secondary | ICD-10-CM

## 2021-03-12 DIAGNOSIS — N179 Acute kidney failure, unspecified: Secondary | ICD-10-CM | POA: Diagnosis not present

## 2021-03-12 DIAGNOSIS — R531 Weakness: Secondary | ICD-10-CM

## 2021-03-12 DIAGNOSIS — I10 Essential (primary) hypertension: Secondary | ICD-10-CM

## 2021-03-12 DIAGNOSIS — J449 Chronic obstructive pulmonary disease, unspecified: Secondary | ICD-10-CM

## 2021-03-12 DIAGNOSIS — N401 Enlarged prostate with lower urinary tract symptoms: Secondary | ICD-10-CM

## 2021-03-12 DIAGNOSIS — I480 Paroxysmal atrial fibrillation: Secondary | ICD-10-CM

## 2021-03-12 DIAGNOSIS — E875 Hyperkalemia: Secondary | ICD-10-CM

## 2021-03-12 DIAGNOSIS — N189 Chronic kidney disease, unspecified: Secondary | ICD-10-CM

## 2021-03-12 DIAGNOSIS — E1122 Type 2 diabetes mellitus with diabetic chronic kidney disease: Secondary | ICD-10-CM | POA: Diagnosis not present

## 2021-03-12 DIAGNOSIS — R350 Frequency of micturition: Secondary | ICD-10-CM

## 2021-03-12 DIAGNOSIS — R630 Anorexia: Secondary | ICD-10-CM

## 2021-03-12 DIAGNOSIS — F319 Bipolar disorder, unspecified: Secondary | ICD-10-CM

## 2021-03-12 DIAGNOSIS — N1832 Chronic kidney disease, stage 3b: Secondary | ICD-10-CM

## 2021-03-12 MED ORDER — VITAMIN B-12 500 MCG PO TABS: 1000.0000 ug | ORAL_TABLET | Freq: Every day | ORAL | 0 refills | Status: DC

## 2021-03-12 MED ORDER — MIRTAZAPINE 7.5 MG PO TABS: ORAL_TABLET | ORAL | 0 refills | Status: DC

## 2021-03-12 MED ORDER — TAMSULOSIN HCL 0.4 MG PO CAPS: 0.4000 mg | ORAL_CAPSULE | Freq: Every day | ORAL | 0 refills | Status: DC

## 2021-03-12 MED ORDER — ISOSORBIDE MONONITRATE ER 60 MG PO TB24: 60.0000 mg | ORAL_TABLET | Freq: Every day | ORAL | 0 refills | Status: DC

## 2021-03-12 MED ORDER — SPIRONOLACTONE 25 MG PO TABS: 12.5000 mg | ORAL_TABLET | Freq: Every day | ORAL | 0 refills | Status: DC

## 2021-03-12 MED ORDER — FERROUS SULFATE 325 (65 FE) MG PO TABS: 325.0000 mg | ORAL_TABLET | Freq: Every day | ORAL | 0 refills | Status: DC

## 2021-03-12 MED ORDER — DIVALPROEX SODIUM ER 500 MG PO TB24: ORAL_TABLET | ORAL | 0 refills | Status: AC

## 2021-03-12 MED ORDER — OMEPRAZOLE MAGNESIUM 20 MG PO TBEC: 20.6000 mg | DELAYED_RELEASE_TABLET | Freq: Every day | ORAL | 0 refills | Status: AC

## 2021-03-12 MED ORDER — QUETIAPINE FUMARATE 100 MG PO TABS: 150.0000 mg | ORAL_TABLET | Freq: Every day | ORAL | 5 refills | Status: AC

## 2021-03-12 MED ORDER — IVABRADINE HCL 5 MG PO TABS: ORAL_TABLET | ORAL | 0 refills | Status: DC

## 2021-03-12 MED ORDER — BUDESONIDE 3 MG PO CPEP: 9.0000 mg | ORAL_CAPSULE | Freq: Every day | ORAL | 0 refills | Status: AC

## 2021-03-12 MED ORDER — ALLOPURINOL 100 MG PO TABS: ORAL_TABLET | ORAL | 0 refills | Status: DC

## 2021-03-12 MED ORDER — MAGNESIUM OXIDE 400 MG PO TABS: 400.0000 mg | ORAL_TABLET | Freq: Three times a day (TID) | ORAL | 0 refills | Status: DC

## 2021-03-12 MED ORDER — APIXABAN 5 MG PO TABS: 5.0000 mg | ORAL_TABLET | Freq: Two times a day (BID) | ORAL | 0 refills | Status: AC

## 2021-03-12 MED ORDER — FUROSEMIDE 20 MG PO TABS: 20.0000 mg | ORAL_TABLET | Freq: Every day | ORAL | 0 refills | Status: DC

## 2021-03-12 MED ORDER — METOPROLOL SUCCINATE ER 25 MG PO TB24: 37.5000 mg | ORAL_TABLET | Freq: Two times a day (BID) | ORAL | 0 refills | Status: DC

## 2021-03-12 MED ORDER — SOLIFENACIN SUCCINATE 5 MG PO TABS: ORAL_TABLET | ORAL | 0 refills | Status: AC

## 2021-03-12 NOTE — Progress Notes (Addendum)
Location:  [facility] Room Number: 226-J Place of Service:  SNF (31) Provider:  Durenda Age, DNP, FNP-BC  Patient Care Team: [name], [name], NP as PCP - General (Family Medicine) Martinique, Peter M, MD as PCP - Cardiology (Cardiology) Larey Dresser, MD as PCP - Advanced Heart Failure (Cardiology) Elmarie Shiley, MD (Nephrology) Milus Banister, MD (Gastroenterology) Jorge Ny, LCSW as Social Worker (Licensed Clinical Social Worker)  Extended Emergency Contact Information Primary Emergency Contact: [place] Phone: [phone] Mobile Phone: [phone] Relation: Sister Secondary Emergency Contact: [facility] Mobile Phone: [phone] Relation: Friend  Code Status:  Full Code   Goals of care: Advanced Directive information Advanced Directives 03/12/2021  Does Patient Have a Medical Advance Directive? Yes  Type of Paramedic of [place];Living will  Does patient want to make changes to medical advance directive? No - Patient declined  Copy of [place] in Chart? Yes - validated most recent copy scanned in chart (See row information)  Would patient like information on creating a medical advance directive? -  Pre-existing out of facility DNR order (yellow form or pink MOST form) -     Chief Complaint  Patient presents with   Discharge Note    Will discharge to home on 03/15/21 with Home health PT and OT    HPI:  Pt is a 76 y.o. male who is for discharge home on 03/15/21 with home health PT and OT.  He was admitted to [facility] on 03/01/21 post hospital admission 02/25/2021 to 03/01/21.  He has a PMH of CKD 3B, hypertension, COPD, celiac disease, BPH, AICD and gout.  He presented to the ED with generalized weakness for 4 to 5 days.  In the ED, he was hemodynamically stable with K6.1 and creatinine 3.62 which is up from his baseline of 2.15.  He was given 2 doses of  Lokelma, calcium gluconate and dextrose IV insulin intermittently.  Lasix was held and was given IV fluids.  On discharge, 03/01/2021, K was down to 4.6 and creatinine down to 2.30.  Patient was admitted to this facility for short-term rehabilitation after the patient's recent hospitalization.  Patient has completed SNF rehabilitation and therapy has cleared the patient for discharge.   Past Medical History:  Diagnosis Date   Anemia    Anxiety    Benign paroxysmal positional vertigo    Bipolar I disorder, most recent episode (or current) unspecified    Celiac disease    CHF (congestive heart failure) (HCC)    Chronic kidney disease, stage III (moderate) (HCC)    Crohn's    Remicade q8 weeks   Depression    Essential and other specified forms of tremor    medication-induced Parkinson's, now resolved   Essential hypertension, benign    Gout 2018   Heart failure ([place])    Hypertrophy of prostate without urinary obstruction and other lower urinary tract symptoms (LUTS)    Impotence of organic origin    Insomnia with sleep apnea, unspecified    Iron deficiency anemia, unspecified    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   Neuralgia, neuritis, and radiculitis, unspecified    Other B-complex deficiencies    Other extrapyramidal disease and abnormal movement disorder    Postinflammatory pulmonary fibrosis ([place])    Tobacco use disorder    Vertigo 2018   Past Surgical History:  Procedure Laterality Date   CATARACT EXTRACTION, BILATERAL Bilateral 09/2018   CHOLECYSTECTOMY  07-12-2010   ICD IMPLANT  N/A 01/02/2020   Procedure: ICD IMPLANT;  Surgeon: Evans Lance, MD;  Location: [facility];  Service: Cardiovascular;  Laterality: N/A;   RIGHT HEART CATH N/A 10/12/2018   Procedure: RIGHT HEART CATH;  Surgeon: Jolaine Artist, MD;  Location: [facility];  Service: Cardiovascular;  Laterality: N/A;   RIGHT/LEFT HEART CATH AND CORONARY ANGIOGRAPHY N/A 04/26/2019   Procedure: RIGHT/LEFT HEART  CATH AND CORONARY ANGIOGRAPHY;  Surgeon: Larey Dresser, MD;  Location: [facility];  Service: Cardiovascular;  Laterality: N/A;   SMALL INTESTINE SURGERY     x 2    Allergies  Allergen Reactions   Azathioprine Other (See Comments)    REACTION: affected WBC "Almost died"   Ciprofloxacin Other (See Comments)    Reaction not recalled   Levaquin [Levofloxacin In D5w] Other (See Comments)    Reaction not rec   Plendil [Felodipine] Other (See Comments)    Reaction not not recalled    Outpatient Encounter Medications as of 03/12/2021  Medication Sig   acetaminophen (TYLENOL) 325 MG tablet Take 650 mg by mouth every 6 (six) hours as needed.   Cholecalciferol (VITAMIN D) 125 MCG (5000 UT) CAPS Take 5,000 Units by mouth daily.   epoetin alfa (EPOGEN) 10000 UNIT/ML injection 10,000 Units every 30 (thirty) days.   loperamide (IMODIUM) 2 MG capsule Take 1 capsule (2 mg total) by mouth 3 (three) times daily as needed for diarrhea or loose stools.   Multiple Vitamin (MULTIVITAMIN WITH MINERALS) TABS tablet Take 1 tablet by mouth daily.   omeprazole (PRILOSEC OTC) 20 MG tablet Take 1 tablet (20 mg total) by mouth daily. For GERD   OXYGEN Inhale 2 L into the lungs continuous. For Hypoxia   UNABLE TO FIND Med Name: Magic Cup twice daily   [DISCONTINUED] allopurinol (ZYLOPRIM) 100 MG tablet TAKE 2 TABLETS(200 MG) BY MOUTH DAILY   [DISCONTINUED] apixaban (ELIQUIS) 5 MG TABS tablet Take 1 tablet (5 mg total) by mouth 2 (two) times daily.   [DISCONTINUED] budesonide (ENTOCORT EC) 3 MG 24 hr capsule Take 3 capsules (9 mg total) by mouth daily.   [DISCONTINUED] dapagliflozin propanediol (FARXIGA) 10 MG TABS tablet Take 1 tablet (10 mg total) by mouth daily before breakfast.   [DISCONTINUED] divalproex (DEPAKOTE ER) 500 MG 24 hr tablet TAKE 3 TABLETS(1500 MG) BY MOUTH AT BEDTIME   [DISCONTINUED] ferrous sulfate 325 (65 FE) MG tablet Take 1 tablet (325 mg total) by mouth in the morning and at bedtime.    [DISCONTINUED] furosemide (LASIX) 20 MG tablet Take 1 tablet (20 mg total) by mouth daily.   [DISCONTINUED] isosorbide mononitrate (IMDUR) 60 MG 24 hr tablet Take 1 tablet (60 mg total) by mouth daily.   [DISCONTINUED] ivabradine (CORLANOR) 5 MG TABS tablet TAKE 1 TABLET(5 MG) BY MOUTH TWICE DAILY WITH A MEAL   [DISCONTINUED] magnesium oxide (MAG-OX) 400 MG tablet Take 400 mg by mouth in the morning, at noon, and at bedtime.   [DISCONTINUED] metoprolol succinate (TOPROL-XL) 25 MG 24 hr tablet Take 1.5 tablets (37.5 mg total) by mouth in the morning and at bedtime.   [DISCONTINUED] mirtazapine (REMERON) 7.5 MG tablet TAKE 1 TABLET(7.5 MG) BY MOUTH AT BEDTIME   [DISCONTINUED] Omeprazole Magnesium (PRILOSEC OTC PO) Take 20.6 mg by mouth daily. For GERD   [DISCONTINUED] QUEtiapine (SEROQUEL) 100 MG tablet Take 1.5 tablets (150 mg total) by mouth at bedtime.   [DISCONTINUED] solifenacin (VESICARE) 5 MG tablet TAKE 1 TABLET(5 MG) BY MOUTH DAILY   [  DISCONTINUED] spironolactone (ALDACTONE) 25 MG tablet Take 0.5 tablets (12.5 mg total) by mouth daily.   [DISCONTINUED] tamsulosin (FLOMAX) 0.4 MG CAPS capsule Take 1 capsule (0.4 mg total) by mouth daily.   [DISCONTINUED] vitamin B-12 (CYANOCOBALAMIN) 500 MCG tablet Take 1,000 mcg by mouth daily.    allopurinol (ZYLOPRIM) 100 MG tablet TAKE 2 TABLETS(200 MG) BY MOUTH DAILY   apixaban (ELIQUIS) 5 MG TABS tablet Take 1 tablet (5 mg total) by mouth 2 (two) times daily.   budesonide (ENTOCORT EC) 3 MG 24 hr capsule Take 3 capsules (9 mg total) by mouth daily.   divalproex (DEPAKOTE ER) 500 MG 24 hr tablet TAKE 3 TABLETS(1500 MG) BY MOUTH AT BEDTIME   ferrous sulfate 325 (65 FE) MG tablet Take 1 tablet (325 mg total) by mouth daily.   furosemide (LASIX) 20 MG tablet Take 1 tablet (20 mg total) by mouth daily.   inFLIXimab (REMICADE) 100 MG injection Infuse Remicade IV schedule 1 50m/kg every 8 weeks Premedicate with Tylenol 500-6573mby mouth and Benadryl  25-5015my mouth prior to infusion. Last PPD was on 12/2009.  (Patient taking differently: Inject 1.5 mg/kg into the vein every 2 (two) months. Infuse Remicade IV schedule 1 5mg36m every 8 weeks Premedicate with Tylenol 500-650mg4mmouth and Benadryl 25-50mg 45mouth prior to infusion. Last PPD was on 12/2009.)   isosorbide mononitrate (IMDUR) 60 MG 24 hr tablet Take 1 tablet (60 mg total) by mouth daily.   ivabradine (CORLANOR) 5 MG TABS tablet TAKE 1 TABLET(5 MG) BY MOUTH TWICE DAILY WITH A MEAL   magnesium oxide (MAG-OX) 400 MG tablet Take 1 tablet (400 mg total) by mouth in the morning, at noon, and at bedtime.   metoprolol succinate (TOPROL-XL) 25 MG 24 hr tablet Take 1.5 tablets (37.5 mg total) by mouth in the morning and at bedtime.   mirtazapine (REMERON) 7.5 MG tablet TAKE 1 TABLET(7.5 MG) BY MOUTH AT BEDTIME   QUEtiapine (SEROQUEL) 100 MG tablet Take 1.5 tablets (150 mg total) by mouth at bedtime.   solifenacin (VESICARE) 5 MG tablet TAKE 1 TABLET(5 MG) BY MOUTH DAILY   spironolactone (ALDACTONE) 25 MG tablet Take 0.5 tablets (12.5 mg total) by mouth daily.   tamsulosin (FLOMAX) 0.4 MG CAPS capsule Take 1 capsule (0.4 mg total) by mouth daily.   vitamin B-12 (CYANOCOBALAMIN) 500 MCG tablet Take 2 tablets (1,000 mcg total) by mouth daily.   [DISCONTINUED] acetaminophen (TYLENOL) 160 MG/5ML liquid Take 10.2 mLs (325 mg total) by mouth every 6 (six) hours as needed for fever or pain.   [DISCONTINUED] Calcium 500 MG tablet Take 1 tablet (500 mg total) by mouth 1 day or 1 dose for 1 dose. (Patient taking differently: Take 500 mg by mouth daily.)   [DISCONTINUED] feeding supplement (ENSURE ENLIVE / ENSURE PLUS) LIQD Take 237 mLs by mouth 2 (two) times daily between meals.   [DISCONTINUED] folic acid (FOLVITE) 1 MG tablet Take 1 tablet (1 mg total) by mouth daily.   [DISCONTINUED] omeprazole (PRILOSEC) 20 MG capsule Take 1 capsule (20 mg total) by mouth daily.   No facility-administered  encounter medications on file as of 03/12/2021.    Review of Systems  Constitutional:  Negative for activity change, appetite change and fever.  HENT:  Negative for sore throat.   Eyes: Negative.   Cardiovascular:  Negative for chest pain and leg swelling.  Gastrointestinal:  Negative for abdominal distention, diarrhea and vomiting.  Genitourinary:  Negative for dysuria, frequency and urgency.  Skin:  Negative for color change.  Neurological:  Negative for dizziness and headaches.  Psychiatric/Behavioral:  Negative for behavioral problems and sleep disturbance. The patient is not nervous/anxious.       Immunization History  Administered Date(s) Administered   Fluad Quad(high Dose 65+) 11/15/2018, 11/17/2019, 11/19/2020   H1N1 02/07/2008   Influenza, High Dose Seasonal PF 11/26/2016, 12/15/2017   Influenza, Seasonal, Injecte, Preservative Fre 12/12/2008, 11/07/2009, 11/04/2010   Influenza,inj,Quad PF,6+ Mos 11/13/2014, 01/03/2016   Influenza-Unspecified 01/11/1997, 12/15/1997, 03/02/2000, 01/02/2003, 12/18/2003, 12/30/2004, 12/10/2005, 12/25/2005, 01/11/2007, 02/07/2008, 12/08/2011, 10/25/2012, 12/16/2012, 12/09/2013, 11/28/2014, 12/31/2015, 11/24/2016, 10/29/2017, 11/24/2017, 12/15/2017, 11/25/2018   Moderna Covid-19 Vaccine Bivalent Booster 76yr & up 11/19/2020   Moderna Sars-Covid-2 Vaccination 04/20/2019, 05/18/2019, 10/25/2019   PPD Test 12/30/2010, 01/05/2012, 01/07/2013, 01/13/2014   Pneumococcal Conjugate-13 01/03/2014, 08/10/2014   Pneumococcal Polysaccharide-23 03/02/2000, 03/05/2004, 03/27/2005, 01/09/2015, 01/03/2016, 05/06/2018   Pneumococcal-Unspecified 12/25/2005   Tdap 05/12/2011, 05/26/2011   Zoster Recombinat (Shingrix) 05/13/2018, 04/01/2019   Pertinent  Health Maintenance Due  Topic Date Due   URINE MICROALBUMIN  Never done   FOOT EXAM  08/09/2020   OPHTHALMOLOGY EXAM  06/28/2021   HEMOGLOBIN A1C  08/26/2021   COLONOSCOPY (Pts 45-475yrInsurance coverage will  need to be confirmed)  09/17/2027   INFLUENZA VACCINE  Completed   Fall Risk 02/27/2021 02/27/2021 02/28/2021 03/01/2021 03/01/2021  Falls in the past year? - - - - -  Was there an injury with Fall? - - - - -  Fall Risk Category Calculator - - - - -  Fall Risk Category - - - - -  Patient Fall Risk Level High fall risk High fall risk High fall risk High fall risk High fall risk  Patient at Risk for Falls Due to - - - - -  Patient at Risk for Falls Due to - - - - -  Fall risk Follow up - - - - -     Vitals:   03/12/21 1052  BP: (!) 146/88  Pulse: 79  Resp: 19  Temp: 97.7 F (36.5 C)  SpO2: 96%  Weight: 165 lb 3.2 oz (74.9 kg)  Height: 6' 4"  (1.93 m)   Body mass index is 20.11 kg/m.  Physical Exam Constitutional:      Appearance: Normal appearance.  HENT:     Head: Normocephalic and atraumatic.     Mouth/Throat:     Mouth: Mucous membranes are moist.  Eyes:     Conjunctiva/sclera: Conjunctivae normal.  Cardiovascular:     Rate and Rhythm: Normal rate and regular rhythm.     Pulses: Normal pulses.     Heart sounds: Normal heart sounds.     Comments: Left chest with AICD. Pulmonary:     Effort: Pulmonary effort is normal.     Breath sounds: Normal breath sounds.  Abdominal:     General: Bowel sounds are normal.     Palpations: Abdomen is soft.  Musculoskeletal:        General: No swelling. Normal range of motion.     Cervical back: Normal range of motion.  Skin:    General: Skin is warm and dry.  Neurological:     General: No focal deficit present.     Mental Status: He is alert and oriented to person, place, and time.  Psychiatric:        Mood and Affect: Mood normal.        Behavior: Behavior normal.        Thought Content: Thought content normal.  Judgment: Judgment normal.       Labs reviewed: Recent Labs    10/10/20 0154 10/11/20 0342 10/12/20 0650 02/25/21 1754 02/26/21 0319 02/27/21 0310 02/27/21 1353 02/28/21 0255 02/28/21 1432  03/01/21 0708 03/08/21 0000  NA 142 144   < >  --    < > 134*  --  138  --  137 144  K 3.9 3.9   < >  --    < > 5.7*   < > 5.5* 4.9 4.6 4.3  CL 114* 109   < >  --    < > 102  --  111  --  111 112*  CO2 22 25   < >  --    < > 27  --  23  --  20* 22  GLUCOSE 119* 101*   < >  --    < > 113*  --  97  --  80  --   BUN 22 19   < >  --    < > 36*  --  35*  --  33* 27*  CREATININE 1.81* 1.92*   < >  --    < > 3.01*  --  2.81*  --  2.30* 1.8*  CALCIUM 8.5* 8.9   < >  --    < > 8.9  --  8.6*  --  8.4* 8.4*  MG 1.4* 2.0   < > 2.2  --  1.8  --  1.7  --   --   --   PHOS 1.6* 3.8  --   --   --   --   --   --   --   --   --    < > = values in this interval not displayed.   Recent Labs    10/10/20 0154 10/31/20 1945 11/01/20 0245 11/06/20 1220  AST 11* 25 23 11   ALT 9 26 27 11   ALKPHOS 49 81 80  --   BILITOT 0.7 0.9 0.8 0.3  PROT 5.8* 6.0* 6.7 6.7  ALBUMIN 2.2* 2.5* 2.6*  --    Recent Labs    11/01/20 0245 11/02/20 0442 11/06/20 1220 11/14/20 1900 02/26/21 0319 02/27/21 0310 02/28/21 0255 03/08/21 0000  WBC 29.6*   < > 10.8   < > 10.7* 8.6 9.7 9.3  NEUTROABS 27.2*  --  7,722  --   --  5.9  --   --   HGB 9.7*   < > 9.2*   < > 11.1* 10.5* 9.8* 10.2*  HCT 31.1*   < > 28.4*   < > 36.2* 33.0* 31.2* 33*  MCV 110.7*   < > 103.3*   < > 111.7* 107.8* 109.9*  --   PLT 141*   < > 147   < > 146* 145* 155 170   < > = values in this interval not displayed.   Lab Results  Component Value Date   TSH 1.183 11/01/2020   Lab Results  Component Value Date   HGBA1C 4.9 02/26/2021   Lab Results  Component Value Date   CHOL 161 10/20/2019   HDL 54 10/20/2019   LDLCALC 86 10/20/2019   TRIG 112 10/20/2019   CHOLHDL 3.0 10/20/2019    Significant Diagnostic Results in last 30 days:  CT Head Wo Contrast  Result Date: 02/25/2021 CLINICAL DATA:  Sudden onset of weakness today resulting in a fall. EXAM: CT HEAD WITHOUT CONTRAST TECHNIQUE: Contiguous axial images were obtained  from the base of the  skull through the vertex without intravenous contrast. COMPARISON:  Head CT 10/31/2020 and MRI 02/07/2018 FINDINGS: Brain: There is no evidence of an acute infarct, intracranial hemorrhage, mass, midline shift, or extra-axial fluid collection. Hypodensities in the cerebral white matter bilaterally are unchanged and nonspecific but compatible with mild chronic small vessel ischemic disease. Mild cerebral atrophy is unchanged. Vascular: No hyperdense vessel. Skull: No fracture or suspicious osseous lesion. Sinuses/Orbits: Minimal bilateral ethmoid sinus and left maxillary sinus mucosal thickening. Clear mastoid air cells. Bilateral cataract extraction. Other: None. IMPRESSION: 1. No evidence of acute intracranial abnormality. 2. Mild chronic small vessel ischemic disease. Electronically Signed   By: Logan Bores M.D.   On: 02/25/2021 17:37   DG Chest Portable 1 View  Result Date: 02/25/2021 CLINICAL DATA:  Generalized weakness. 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 Stable normal heart size. Unchanged mediastinal contours. No focal airspace disease, pleural effusion, or pneumothorax. No pulmonary edema. No acute osseous abnormalities are seen. IMPRESSION: No acute chest finding. Electronically Signed   By: Keith Rake M.D.   On: 02/25/2021 21:56    Assessment/Plan  1. Acute kidney injury superimposed on chronic kidney disease [facility]) Lab Results  Component Value Date   NA 144 03/08/2021   K 4.3 03/08/2021   CO2 22 03/08/2021   GLUCOSE 80 03/01/2021   BUN 27 (A) 03/08/2021   CREATININE 1.8 (A) 03/08/2021   CALCIUM 8.4 (A) 03/08/2021   EGFR 34 (L) 11/06/2020   GFRNONAA 29 (L) 03/01/2021   -   Improved -   was given IV fluids in the hospital  2. Hyperkalemia Lab Results  Component Value Date   K 4.3 03/08/2021   -  resolved -   was given Lokelma X 2 and dextrose with IV insulin in the hospital  3. Paroxysmal atrial  fibrillation (HCC) - apixaban (ELIQUIS) 5 MG TABS tablet; Take 1 tablet (5 mg total) by mouth 2 (two) times daily.  Dispense: 60 tablet; Refill: 0 - metoprolol succinate (TOPROL-XL) 25 MG 24 hr tablet; Take 1.5 tablets (37.5 mg total) by mouth in the morning and at bedtime.  Dispense: 90 tablet; Refill: 0  4. Macrocytic anemia Lab Results  Component Value Date   WBC 9.3 03/08/2021   HGB 10.2 (A) 03/08/2021   HCT 33 (A) 03/08/2021   MCV 109.9 (H) 02/28/2021   PLT 170 03/08/2021   - vitamin B-12 (CYANOCOBALAMIN) 500 MCG tablet; Take 2 tablets (1,000 mcg total) by mouth daily.  Dispense: 60 tablet; Refill: 0 -  ferrous sulfate 325 (65 FE) MG tablet; Take 1 tablet (325 mg total) by mouth daily.  Dispense: 30 tablet; Refill: 0  5. Essential hypertension, benign -  continue Metoprolol succinate ER  6. Chronic systolic heart failure (HCC) - ivabradine (CORLANOR) 5 MG TABS tablet; TAKE 1 TABLET(5 MG) BY MOUTH TWICE DAILY WITH A MEAL  Dispense: 60 tablet; Refill: 0 - furosemide (LASIX) 20 MG tablet; Take 1 tablet (20 mg total) by mouth daily.  Dispense: 30 tablet; Refill: 0 - spironolactone (ALDACTONE) 25 MG tablet; Take 0.5 tablets (12.5 mg total) by mouth daily.  Dispense: 15 tablet; Refill: 0 - isosorbide mononitrate (IMDUR) 60 MG 24 hr tablet; Take 1 tablet (60 mg total) by mouth daily.  Dispense: 30 tablet; Refill: 0  7. Type 2 diabetes mellitus with stage 3b chronic kidney disease, without long-term current use of insulin (HCC) Lab Results  Component Value  Date   HGBA1C 4.9 02/26/2021   -  discontinue Farxiga  8. Benign prostatic hyperplasia with urinary frequency - solifenacin (VESICARE) 5 MG tablet; TAKE 1 TABLET(5 MG) BY MOUTH DAILY  Dispense: 30 tablet; Refill: 0 - tamsulosin (FLOMAX) 0.4 MG CAPS capsule; Take 1 capsule (0.4 mg total) by mouth daily.  Dispense: 30 capsule; Refill: 0  9. Poor appetite - mirtazapine (REMERON) 7.5 MG tablet; TAKE 1 TABLET(7.5 MG) BY MOUTH AT  BEDTIME  Dispense: 30 tablet; Refill: 0  10. Chronic obstructive pulmonary disease, unspecified COPD type (HCC) - budesonide (ENTOCORT EC) 3 MG 24 hr capsule; Take 3 capsules (9 mg total) by mouth daily.  Dispense: 90 capsule; Refill: 0  11. Bipolar 1 disorder (HCC) - divalproex (DEPAKOTE ER) 500 MG 24 hr tablet; TAKE 3 TABLETS(1500 MG) BY MOUTH AT BEDTIME  Dispense: 90 tablet; Refill: 0 - QUEtiapine (SEROQUEL) 100 MG tablet; Take 1.5 tablets (150 mg total) by mouth at bedtime.  Dispense: 45 tablet; Refill: 5  12. Chronic gout without tophus, unspecified cause, unspecified site - allopurinol (ZYLOPRIM) 100 MG tablet; TAKE 2 TABLETS(200 MG) BY MOUTH DAILY  Dispense: 60 tablet; Refill: 0  13. Gastroesophageal reflux disease without esophagitis - omeprazole (PRILOSEC OTC) 20 MG tablet; Take 1 tablet (20 mg total) by mouth daily. For GERD  Dispense: 30 tablet; Refill: 0  14 . Generalized weakness -   for Home health PT and OT for therapeutic strengthening exercises     I have filled out patient's discharge paperwork and e-prescribed medications.  Patient will have home health PT and OT.  DME provided:  None  Total discharge time: Greater than 30 minutes Greater tan 50% was spent in counseling and coordination of care.   Discharge time involved coordination of the discharge process with social worker, nursing staff and therapy department. Medical justification for home health services verified.    Durenda Age, DNP, MSN, FNP-BC [facility] [phone] (Monday-Friday 8:00 a.m. - 5:00 p.m.) [phone] (after hours)

## 2021-03-13 DIAGNOSIS — R2681 Unsteadiness on feet: Secondary | ICD-10-CM | POA: Diagnosis not present

## 2021-03-13 DIAGNOSIS — M6281 Muscle weakness (generalized): Secondary | ICD-10-CM | POA: Diagnosis not present

## 2021-03-13 DIAGNOSIS — J449 Chronic obstructive pulmonary disease, unspecified: Secondary | ICD-10-CM | POA: Diagnosis not present

## 2021-03-13 DIAGNOSIS — R1312 Dysphagia, oropharyngeal phase: Secondary | ICD-10-CM | POA: Diagnosis not present

## 2021-03-13 NOTE — Progress Notes (Signed)
No ICM remote transmission received for 03/11/2021 and next ICM transmission scheduled for 04/01/2021.

## 2021-03-18 ENCOUNTER — Telehealth (HOSPITAL_COMMUNITY): Payer: Self-pay

## 2021-03-18 ENCOUNTER — Other Ambulatory Visit: Payer: Self-pay | Admitting: *Deleted

## 2021-03-18 DIAGNOSIS — I5022 Chronic systolic (congestive) heart failure: Secondary | ICD-10-CM

## 2021-03-18 NOTE — Patient Outreach (Signed)
[facility]) Care Management  03/18/2021  Brent [name] 11-Aug-1945 [id number]   Received hospital referral from Marthenia Rolling, RN for [place] (central) for complex case management. Assigned to Raina Mina, RN for follow up.  Thank you, [place] Care Management Assistant

## 2021-03-18 NOTE — Patient Outreach (Signed)
[facility] Post- Acute Care Coordinator follow up. Member screened for potential [facility] services as a a benefit of [name] insurance plan.   Verified in [place] that [name] transitioned from [facility] to home on 03/15/21. He will have [facility] for home health services.  [name] previously agreed to [place] Management follow up. Please see writer's notes from 03/06/21.  Will make [facility] Management referral for [facility] for complex case management.  Best contact number for [name] verified as [phone].  [name] has history of CHF, CKD, HTN, bipolar, celiac, DM.   Marthenia Rolling, MSN, RN,BSN [place] Acute Care Coordinator [phone] [facility]) [phone]  (Toll free office)

## 2021-03-18 NOTE — Telephone Encounter (Signed)
Spoke to [name] and he reports he is home now from SNF. We planned a home visit for tomorrow at 11:00. Call complete.

## 2021-03-19 ENCOUNTER — Other Ambulatory Visit (HOSPITAL_COMMUNITY): Payer: Self-pay

## 2021-03-19 NOTE — Progress Notes (Signed)
Paramedicine Encounter    Patient ID: Brent [name], male    DOB: [birth year], 76 y.o.   MRN: [id number]  Met with Brent [name] in home today following his recent discharge from SNF. He reports feeling good and having no shortness of breath, dizziness, chest pain or trouble taking his medications.   I obtained assessment and vitals.  No swelling noted, no JVD. Lungs clear.   I reviewed medications and filled pill box accordingly.   FARXIGA NOT ON LIST, HE HAS THIS AT HOME BUT WE DID NOT PLACE IN PILL BOX UNTIL WE VERIFY WITH HF CLINIC NEXT WEEK.  Refills: Corlanor ($100 copay- send to [place]) Seroquel ($5 at [facility]) Solfenacin ($28 at [facility])  We reviewed upcoming appointments and confirmed same.   He stated [name] is his home health nurse and she is assisting with meal plans and cleaning around his home. [name] has my contact if she has any questions or concerns.   I will see Brent [name] in clinic next week with Dr. [name].     Patient Care Team: [name], [name], NP as PCP - General (Family Medicine) Martinique, Peter M, MD as PCP - Cardiology (Cardiology) Larey Dresser, MD as PCP - Advanced Heart Failure (Cardiology) Elmarie Shiley, MD (Nephrology) Milus Banister, MD (Gastroenterology) Jorge Ny, LCSW as Social Worker (Licensed Clinical Social Worker) Tobi Bastos, RN as [place] Management  Patient Active Problem List   Diagnosis Date Noted   Acute kidney injury superimposed on chronic kidney disease ([place]) 02/25/2021   Generalized weakness 02/25/2021   SIRS (systemic inflammatory response syndrome) ([place]) 10/31/2020   Neurocognitive deficits 10/17/2020   Hypokalemia 10/08/2020   Hypomagnesemia 10/08/2020   CKD (chronic kidney disease), stage III ([place]) 10/08/2020   COVID-19 virus infection 10/08/2020   Electrolyte abnormality 05/17/2020   ICD (implantable cardioverter-defibrillator) in place 90/24/0973   Chronic systolic heart failure ([place]) 01/02/2020    Decreased appetite 10/08/2019   Weakness 10/08/2019   Hyperkalemia 53/29/9242   Chronic systolic CHF (congestive heart failure) ([place]) 08/26/2019   Type 2 diabetes mellitus with stage 3 chronic kidney disease ([place]) 08/26/2019   Chronic obstructive pulmonary disease ([place]) 06/20/2019   Ventricular tachycardia 06/20/2019   Acute on chronic systolic (congestive) heart failure ([place]) 68/34/1962   Metabolic acidosis 22/97/9892   Orthostasis 09/07/2018   AKI (acute kidney injury) ([place])    Immunosuppressed status ([place])    Macrocytic anemia    Chronic combined systolic and diastolic congestive heart failure ([place]) 06/03/2018   Candida esophagitis ([place]) 09/22/2017   History of smoking 30 or more pack years 01/05/2017   Bipolar 1 disorder ([place])    BPH (benign prostatic hyperplasia) 03/31/2013   Anemia in chronic kidney disease 03/31/2013   Essential hypertension, benign 03/31/2013   Acute renal failure superimposed on stage 3 chronic kidney disease ([place]) 06/04/2012   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[place]) 01/23/2010    Current Outpatient Medications:    acetaminophen (TYLENOL) 325 MG tablet, Take 650 mg by mouth every 6 (six) hours as needed., Disp: , Rfl:    allopurinol (ZYLOPRIM) 100 MG tablet, TAKE 2 TABLETS(200 MG) BY MOUTH DAILY, Disp: 60 tablet, Rfl: 0   apixaban (ELIQUIS) 5 MG TABS tablet, Take 1 tablet (5 mg total) by mouth 2 (two) times daily., Disp: 60 tablet, Rfl: 0   budesonide (ENTOCORT EC) 3 MG 24 hr capsule, Take 3 capsules (9 mg total) by mouth daily., Disp: 90 capsule, Rfl: 0   Cholecalciferol (VITAMIN D) 125  MCG (5000 UT) CAPS, Take 5,000 Units by mouth daily., Disp: 30 capsule, Rfl: 0   divalproex (DEPAKOTE ER) 500 MG 24 hr tablet, TAKE 3 TABLETS(1500 MG) BY MOUTH AT BEDTIME, Disp: 90 tablet, Rfl: 0   epoetin alfa (EPOGEN) 10000 UNIT/ML injection, 10,000 Units every 30 (thirty) days., Disp: , Rfl:    ferrous sulfate 325 (65 FE) MG tablet, Take 1 tablet (325 mg total) by mouth  daily., Disp: 30 tablet, Rfl: 0   furosemide (LASIX) 20 MG tablet, Take 1 tablet (20 mg total) by mouth daily., Disp: 30 tablet, Rfl: 0   inFLIXimab (REMICADE) 100 MG injection, Infuse Remicade IV schedule 1 52m/kg every 8 weeks Premedicate with Tylenol 500-6574mby mouth and Benadryl 25-5047my mouth prior to infusion. Last PPD was on 12/2009.  (Patient taking differently: Inject 1.5 mg/kg into the vein every 2 (two) months. Infuse Remicade IV schedule 1 5mg58m every 8 weeks Premedicate with Tylenol 500-650mg47mmouth and Benadryl 25-50mg 71mouth prior to infusion. Last PPD was on 12/2009.), Disp: 1 each, Rfl: 6   isosorbide mononitrate (IMDUR) 60 MG 24 hr tablet, Take 1 tablet (60 mg total) by mouth daily., Disp: 30 tablet, Rfl: 0   ivabradine (CORLANOR) 5 MG TABS tablet, TAKE 1 TABLET(5 MG) BY MOUTH TWICE DAILY WITH A MEAL, Disp: 60 tablet, Rfl: 0   loperamide (IMODIUM) 2 MG capsule, Take 1 capsule (2 mg total) by mouth 3 (three) times daily as needed for diarrhea or loose stools., Disp: 30 capsule, Rfl: 0   magnesium oxide (MAG-OX) 400 MG tablet, Take 1 tablet (400 mg total) by mouth in the morning, at noon, and at bedtime., Disp: 90 tablet, Rfl: 0   metoprolol succinate (TOPROL-XL) 25 MG 24 hr tablet, Take 1.5 tablets (37.5 mg total) by mouth in the morning and at bedtime., Disp: 90 tablet, Rfl: 0   mirtazapine (REMERON) 7.5 MG tablet, TAKE 1 TABLET(7.5 MG) BY MOUTH AT BEDTIME, Disp: 30 tablet, Rfl: 0   Multiple Vitamin (MULTIVITAMIN WITH MINERALS) TABS tablet, Take 1 tablet by mouth daily., Disp: , Rfl:    omeprazole (PRILOSEC OTC) 20 MG tablet, Take 1 tablet (20 mg total) by mouth daily. For GERD, Disp: 30 tablet, Rfl: 0   OXYGEN, Inhale 2 L into the lungs continuous. For Hypoxia, Disp: , Rfl:    QUEtiapine (SEROQUEL) 100 MG tablet, Take 1.5 tablets (150 mg total) by mouth at bedtime., Disp: 45 tablet, Rfl: 5   solifenacin (VESICARE) 5 MG tablet, TAKE 1 TABLET(5 MG) BY MOUTH DAILY, Disp: 30  tablet, Rfl: 0   spironolactone (ALDACTONE) 25 MG tablet, Take 0.5 tablets (12.5 mg total) by mouth daily., Disp: 15 tablet, Rfl: 0   tamsulosin (FLOMAX) 0.4 MG CAPS capsule, Take 1 capsule (0.4 mg total) by mouth daily., Disp: 30 capsule, Rfl: 0   UNABLE TO FIND, Med Name: Magic Cup twice daily, Disp: , Rfl:    vitamin B-12 (CYANOCOBALAMIN) 500 MCG tablet, Take 2 tablets (1,000 mcg total) by mouth daily., Disp: 60 tablet, Rfl: 0 Allergies  Allergen Reactions   Azathioprine Other (See Comments)    REACTION: affected WBC "Almost died"   Ciprofloxacin Other (See Comments)    Reaction not recalled   Levaquin [Levofloxacin In D5w] Other (See Comments)    Reaction not rec   Plendil [Felodipine] Other (See Comments)    Reaction not not recalled     Social History   Socioeconomic History   Marital status: Divorced    Spouse name:  Not on file   Number of children: 1   Years of education: 12   Highest education level: Not on file  Occupational History   Occupation: retired   Occupation: Veteran  Tobacco Use   Smoking status: Former    Packs/day: 1.00    Years: 49.00    Pack years: 49.00    Types: Cigarettes    Start date: 04/11/1956    Quit date: 04/17/2014    Years since quitting: 6.9   Smokeless tobacco: Never  Vaping Use   Vaping Use: Never used  Substance and Sexual Activity   Alcohol use: No    Alcohol/week: 0.0 standard drinks   Drug use: No   Sexual activity: Never  Other Topics Concern   Not on file  Social History Narrative   Not on file   Social Determinants of Health   Financial Resource Strain: Not on file  Food Insecurity: Food Insecurity Present   Worried About Charity fundraiser in the Last Year: Sometimes true   Ran Out of Food in the Last Year: Sometimes true  Transportation Needs: Unmet Transportation Needs   Lack of Transportation (Medical): Yes   Lack of Transportation (Non-Medical): Yes  Physical Activity: Not on file  Stress: Not on file   Social Connections: Not on file  Intimate Partner Violence: Not on file    Physical Exam Vitals reviewed.  Constitutional:      Appearance: Normal appearance.  HENT:     Head: Normocephalic.     Nose: Nose normal.     Mouth/Throat:     Mouth: Mucous membranes are moist.     Pharynx: Oropharynx is clear.  Eyes:     Conjunctiva/sclera: Conjunctivae normal.     Pupils: Pupils are equal, round, and reactive to light.  Cardiovascular:     Rate and Rhythm: Normal rate and regular rhythm.     Pulses: Normal pulses.     Heart sounds: Normal heart sounds.  Pulmonary:     Effort: Pulmonary effort is normal.     Breath sounds: Normal breath sounds.  Abdominal:     General: Abdomen is flat.     Palpations: Abdomen is soft.  Musculoskeletal:        General: No swelling. Normal range of motion.     Cervical back: Normal range of motion.     Right lower leg: No edema.     Left lower leg: No edema.  Skin:    General: Skin is warm and dry.     Capillary Refill: Capillary refill takes less than 2 seconds.  Neurological:     General: No focal deficit present.     Mental Status: He is alert. Mental status is at baseline.  Psychiatric:        Mood and Affect: Mood normal.        Future Appointments  Date Time Provider [place]  03/20/2021 11:00 AM WL-SC[facility] RM 2 WL-SCAC None  03/27/2021 11:15 AM MC ECHO/CH OP [facility]OLAB [facility]  03/27/2021 12:00 PM Larey Dresser, MD MC-HVSC None  04/01/2021  7:15 AM [facility] DEVICE REMOTES [facility]  04/02/2021  7:00 AM [facility] DEVICE REMOTES [facility]  04/08/2021  9:00 AM PSC-PSC LAB PSC-PSC None  04/12/2021  2:15 PM [name], Dinah C, NP PSC-PSC None  07/02/2021  7:00 AM [facility] DEVICE REMOTES [facility]  01/31/2022  3:45 PM [name], [name], NP PSC-PSC None     ACTION: Home visit completed

## 2021-03-20 ENCOUNTER — Non-Acute Institutional Stay (HOSPITAL_COMMUNITY): Admission: RE | Admit: 2021-03-20 | Payer: Medicare Other | Source: Ambulatory Visit

## 2021-03-20 ENCOUNTER — Other Ambulatory Visit (HOSPITAL_COMMUNITY): Payer: Self-pay | Admitting: *Deleted

## 2021-03-20 ENCOUNTER — Other Ambulatory Visit: Payer: Self-pay

## 2021-03-20 ENCOUNTER — Other Ambulatory Visit: Payer: Self-pay | Admitting: *Deleted

## 2021-03-20 DIAGNOSIS — I5022 Chronic systolic (congestive) heart failure: Secondary | ICD-10-CM

## 2021-03-20 DIAGNOSIS — I480 Paroxysmal atrial fibrillation: Secondary | ICD-10-CM

## 2021-03-20 DIAGNOSIS — K50918 Crohn's disease, unspecified, with other complication: Secondary | ICD-10-CM | POA: Diagnosis not present

## 2021-03-20 MED ORDER — METOPROLOL SUCCINATE ER 25 MG PO TB24: 37.5000 mg | ORAL_TABLET | Freq: Two times a day (BID) | ORAL | 6 refills | Status: DC

## 2021-03-20 MED ORDER — FUROSEMIDE 20 MG PO TABS: 20.0000 mg | ORAL_TABLET | Freq: Every day | ORAL | 3 refills | Status: DC

## 2021-03-20 MED ORDER — IVABRADINE HCL 5 MG PO TABS: ORAL_TABLET | ORAL | 3 refills | Status: DC

## 2021-03-20 MED ORDER — ISOSORBIDE MONONITRATE ER 60 MG PO TB24: 60.0000 mg | ORAL_TABLET | Freq: Every day | ORAL | 3 refills | Status: AC

## 2021-03-20 MED ORDER — SPIRONOLACTONE 25 MG PO TABS: 12.5000 mg | ORAL_TABLET | Freq: Every day | ORAL | 6 refills | Status: AC

## 2021-03-20 NOTE — Patient Outreach (Signed)
[place] [facility]) Care Management  03/20/2021  JUANMANUEL MAROHL 02-06-1946 [id number]   Referral Received 1/23 Transition of Care -Unsuccessful  RN attempted the initial outreach however unsuccessful. RN able to leave a HIPAA requesting a call back.  Will send outreach letter and attempt another call over the next week.  Raina Mina, RN Care Management Coordinator [place] Office [phone]

## 2021-03-25 ENCOUNTER — Other Ambulatory Visit: Payer: Self-pay | Admitting: *Deleted

## 2021-03-25 NOTE — Patient Outreach (Signed)
[place] [facility]) Care Management  03/25/2021  Brent [name] December 29, 1945 [id number]  Transition of Care-RE: Meriel Flavors Tuesday  RN spoke with pt today who requested a call back indicating he was not in town.   RN will attempted another outreach call tomorrow as requested for pending [facility] services.  Raina Mina, RN Care Management Coordinator [place] Office [phone]

## 2021-03-26 ENCOUNTER — Telehealth (HOSPITAL_COMMUNITY): Payer: Self-pay

## 2021-03-26 ENCOUNTER — Encounter: Payer: Self-pay | Admitting: *Deleted

## 2021-03-26 ENCOUNTER — Other Ambulatory Visit: Payer: Self-pay | Admitting: *Deleted

## 2021-03-26 NOTE — Patient Instructions (Signed)
Visit Information  Thank you for taking time to visit with me today. Please don't hesitate to contact me if I can be of assistance to you before our next scheduled telephone appointment.  Following are the goals we discussed today:   Take all medications as prescribed Attend all scheduled provider appointments Call pharmacy for medication refills 3-7 days in advance of running out of medications Attend church or other social activities Perform all self care activities independently  Perform IADL's (shopping, preparing meals, housekeeping, managing finances) independently Call provider office for new concerns or questions  call office if I gain more than 2 pounds in one day or 5 pounds in one week do ankle pumps when sitting keep legs up while sitting track weight in diary use salt in moderation watch for swelling in feet, ankles and legs every day weigh myself daily begin a heart failure diary bring diary to all appointments develop a rescue plan follow rescue plan if symptoms flare-up eat more whole grains, fruits and vegetables, lean meats and healthy fats know when to call the doctor:with symptoms in the YELLOW zone or when symptomatic track symptoms and what helps feel better or worse

## 2021-03-26 NOTE — Telephone Encounter (Signed)
Called [name] to confirm appointment for tomorrow at HF clinic at 11:15 echo and Dr. [name] at 12:00. He confirmed he will be there and will bring all meds and pill box. I will meet him there. Call complete.

## 2021-03-26 NOTE — Patient Outreach (Signed)
[place] [facility]) Care Management  [place]  03/26/2021   Brent [name] [birth year] [id number]  Telephone Assessment-Successful  Spoke with pt today and enrolled into the [facility] program for CHF. Pt expressed he has the following in place:  -Life Alert -Par-medicine for pill box refills every Wed -Cleaning services provided by the [place] -IV therapy RN [name] [name] that also assist pt with other needs. -Remote defibrillator device for constant monitoring.  Pt receptive to monthly follow up calls of inquiries for CHF program. Verified pt has a scale and also is involved with services with his VA benefits.  Will communicate with his provider and send information packet with goals as discussed today for [facility] services.  Encounter Medications:  Outpatient Encounter Medications as of 03/26/2021  Medication Sig   acetaminophen (TYLENOL) 325 MG tablet Take 650 mg by mouth every 6 (six) hours as needed.   allopurinol (ZYLOPRIM) 100 MG tablet TAKE 2 TABLETS(200 MG) BY MOUTH DAILY   apixaban (ELIQUIS) 5 MG TABS tablet Take 1 tablet (5 mg total) by mouth 2 (two) times daily.   budesonide (ENTOCORT EC) 3 MG 24 hr capsule Take 3 capsules (9 mg total) by mouth daily.   Cholecalciferol (VITAMIN D) 125 MCG (5000 UT) CAPS Take 5,000 Units by mouth daily.   divalproex (DEPAKOTE ER) 500 MG 24 hr tablet TAKE 3 TABLETS(1500 MG) BY MOUTH AT BEDTIME   epoetin alfa (EPOGEN) 10000 UNIT/ML injection 10,000 Units every 30 (thirty) days.   ferrous sulfate 325 (65 FE) MG tablet Take 1 tablet (325 mg total) by mouth daily.   furosemide (LASIX) 20 MG tablet Take 1 tablet (20 mg total) by mouth daily.   inFLIXimab (REMICADE) 100 MG injection Infuse Remicade IV schedule 1 65m/kg every 8 weeks Premedicate with Tylenol 500-6511mby mouth and Benadryl 25-5036my mouth prior to infusion. Last PPD was on 12/2009.  (Patient taking differently: Inject 1.5 mg/kg into the vein every 2 (two) months. Infuse  Remicade IV schedule 1 5mg63m every 8 weeks Premedicate with Tylenol 500-650mg45mmouth and Benadryl 25-50mg 63mouth prior to infusion. Last PPD was on 12/2009.)   isosorbide mononitrate (IMDUR) 60 MG 24 hr tablet Take 1 tablet (60 mg total) by mouth daily.   ivabradine (CORLANOR) 5 MG TABS tablet TAKE 1 TABLET(5 MG) BY MOUTH TWICE DAILY WITH A MEAL   loperamide (IMODIUM) 2 MG capsule Take 1 capsule (2 mg total) by mouth 3 (three) times daily as needed for diarrhea or loose stools.   magnesium oxide (MAG-OX) 400 MG tablet Take 1 tablet (400 mg total) by mouth in the morning, at noon, and at bedtime.   metoprolol succinate (TOPROL-XL) 25 MG 24 hr tablet Take 1.5 tablets (37.5 mg total) by mouth in the morning and at bedtime.   mirtazapine (REMERON) 7.5 MG tablet TAKE 1 TABLET(7.5 MG) BY MOUTH AT BEDTIME   Multiple Vitamin (MULTIVITAMIN WITH MINERALS) TABS tablet Take 1 tablet by mouth daily.   omeprazole (PRILOSEC OTC) 20 MG tablet Take 1 tablet (20 mg total) by mouth daily. For GERD   QUEtiapine (SEROQUEL) 100 MG tablet Take 1.5 tablets (150 mg total) by mouth at bedtime.   solifenacin (VESICARE) 5 MG tablet TAKE 1 TABLET(5 MG) BY MOUTH DAILY   spironolactone (ALDACTONE) 25 MG tablet Take 0.5 tablets (12.5 mg total) by mouth daily.   tamsulosin (FLOMAX) 0.4 MG CAPS capsule Take 1 capsule (0.4 mg total) by mouth daily.   vitamin B-12 (CYANOCOBALAMIN) 500 MCG tablet  Take 2 tablets (1,000 mcg total) by mouth daily.   OXYGEN Inhale 2 L into the lungs continuous. For Hypoxia (Patient not taking: Reported on 03/19/2021)   UNABLE TO FIND Med Name: Magic Cup twice daily (Patient not taking: Reported on 03/19/2021)   No facility-administered encounter medications on file as of 03/26/2021.    Functional Status:  In your present state of health, do you have any difficulty performing the following activities: 03/26/2021 01/29/2021  Hearing? - N  Vision? - N  Difficulty concentrating or making decisions? -  Y  Comment - Remembering  Walking or climbing stairs? - N  Dressing or bathing? - N  Doing errands, shopping? - N  Conservation officer, nature and eating ? N Y  Comment - has an Museum/gallery curator? N N  In the past six months, have you accidently leaked urine? N N  Do you have problems with loss of bowel control? N N  Managing your Medications? Tempie Donning  Comment Para medicine arranges pillbox has paramedic assisting once a week  Managing your Finances? N N  Housekeeping or managing your Housekeeping? Y Y  Comment Has a cleaning lady to assist (VA assist) has an Environmental consultant  Some recent data might be hidden    Fall/Depression Screening: Fall Risk  03/26/2021 01/29/2021 11/06/2020  Falls in the past year? 0 0 1  Number falls in past yr: - 0 1  Injury with Fall? - 0 1  Risk for fall due to : Impaired balance/gait No Fall Risks History of fall(s)  Risk for fall due to: Comment - - -  Follow up Education provided;Falls prevention discussed Falls evaluation completed Falls evaluation completed   PHQ 2/9 Scores 03/26/2021 01/29/2021 01/24/2020 12/16/2019 10/20/2019 06/20/2019 05/06/2019  PHQ - 2 Score 0 0 0 0 0 0 0    Assessment:   Care Plan Care Plan : RN Care Manger Plan of Care  Updates made by Tobi Bastos, RN since 03/26/2021 12:00 AM     Problem: Knowledge Deficit related to CHF management and care coordination needs   Priority: High     Long-Range Goal: Establish Plan of Care for managing Complex care coordination needs in pt with CHF   Start Date: 03/26/2021  Expected End Date: 08/23/2021  Priority: High  Note:   Current Barriers:  Knowledge Deficits related to plan of care for management of CHF   RNCM Clinical Goal(s):  Patient will verbalize understanding of plan for management of CHF as evidenced by teach back method of education take all medications exactly as prescribed and will call provider for medication related questions as evidenced by self reporting via chart  review demonstrate understanding of rationale for each prescribed medication as evidenced by self reporting education and   through collaboration with RN Care manager, provider, and care team.   Interventions: Inter-disciplinary care team collaboration (see longitudinal plan of care) Evaluation of current treatment plan related to  self management and patient's adherence to plan as established by provider   Heart Failure Interventions:  (Status:  New goal.) Long Term Goal Basic overview and discussion of pathophysiology of Heart Failure reviewed Provided education on low sodium diet Reviewed Heart Failure Action Plan in depth and provided written copy Assessed need for readable accurate scales in home Provided education about placing scale on hard, flat surface Advised patient to weigh each morning after emptying bladder Discussed importance of daily weight and advised patient to weigh and record daily Reviewed role of diuretics  in prevention of fluid overload and management of heart failure; Discussed the importance of keeping all appointments with provider Screening for signs and symptoms of depression related to chronic disease state   Patient Goals/Self-Care Activities: Take all medications as prescribed Attend all scheduled provider appointments Call pharmacy for medication refills 3-7 days in advance of running out of medications Attend church or other social activities Perform all self care activities independently  Perform IADL's (shopping, preparing meals, housekeeping, managing finances) independently Call provider office for new concerns or questions  call office if I gain more than 2 pounds in one day or 5 pounds in one week do ankle pumps when sitting keep legs up while sitting track weight in diary use salt in moderation watch for swelling in feet, ankles and legs every day weigh myself daily begin a heart failure diary bring diary to all appointments develop a  rescue plan follow rescue plan if symptoms flare-up eat more whole grains, fruits and vegetables, lean meats and healthy fats know when to call the doctor:with symptoms in the YELLOW zone or when symptomatic track symptoms and what helps feel better or worse  Follow Up Plan:  Telephone follow up appointment with care management team member scheduled for:  Feb 2023 The patient has been provided with contact information for the care management team and has been advised to call with any health related questions or concerns.       Raina Mina, RN Care Management Coordinator [place] Office [phone]

## 2021-03-27 ENCOUNTER — Other Ambulatory Visit (HOSPITAL_COMMUNITY): Payer: Self-pay

## 2021-03-27 ENCOUNTER — Encounter (HOSPITAL_COMMUNITY): Payer: Self-pay | Admitting: Cardiology

## 2021-03-27 ENCOUNTER — Ambulatory Visit (HOSPITAL_COMMUNITY)
Admission: RE | Admit: 2021-03-27 | Discharge: 2021-03-27 | Disposition: A | Discharge status: Home / Self Care | Payer: Medicare Other | Source: Ambulatory Visit | Attending: Cardiology | Admitting: Cardiology

## 2021-03-27 ENCOUNTER — Ambulatory Visit (HOSPITAL_BASED_OUTPATIENT_CLINIC_OR_DEPARTMENT_OTHER)
Admission: RE | Admit: 2021-03-27 | Discharge: 2021-03-27 | Disposition: A | Discharge status: Home / Self Care | Payer: Medicare Other | Source: Ambulatory Visit | Attending: Cardiology | Admitting: Cardiology

## 2021-03-27 ENCOUNTER — Telehealth (HOSPITAL_COMMUNITY): Payer: Self-pay

## 2021-03-27 ENCOUNTER — Other Ambulatory Visit: Payer: Self-pay

## 2021-03-27 VITALS — BP 104/70 | HR 85 | Wt 168.2 lb

## 2021-03-27 DIAGNOSIS — K900 Celiac disease: Secondary | ICD-10-CM | POA: Diagnosis not present

## 2021-03-27 DIAGNOSIS — Z87891 Personal history of nicotine dependence: Secondary | ICD-10-CM | POA: Diagnosis not present

## 2021-03-27 DIAGNOSIS — F319 Bipolar disorder, unspecified: Secondary | ICD-10-CM | POA: Diagnosis not present

## 2021-03-27 DIAGNOSIS — I5022 Chronic systolic (congestive) heart failure: Secondary | ICD-10-CM | POA: Insufficient documentation

## 2021-03-27 DIAGNOSIS — N183 Chronic kidney disease, stage 3 unspecified: Secondary | ICD-10-CM | POA: Diagnosis not present

## 2021-03-27 DIAGNOSIS — J449 Chronic obstructive pulmonary disease, unspecified: Secondary | ICD-10-CM | POA: Diagnosis not present

## 2021-03-27 DIAGNOSIS — K5090 Crohn's disease, unspecified, without complications: Secondary | ICD-10-CM | POA: Insufficient documentation

## 2021-03-27 DIAGNOSIS — I5042 Chronic combined systolic (congestive) and diastolic (congestive) heart failure: Secondary | ICD-10-CM

## 2021-03-27 DIAGNOSIS — I130 Hypertensive heart and chronic kidney disease with heart failure and stage 1 through stage 4 chronic kidney disease, or unspecified chronic kidney disease: Secondary | ICD-10-CM | POA: Diagnosis not present

## 2021-03-27 LAB — BASIC METABOLIC PANEL
Anion gap: 9 (ref 5–15)
BUN: 35 mg/dL — ABNORMAL HIGH (ref 8–23)
CO2: 25 mmol/L (ref 22–32)
Calcium: 8.5 mg/dL — ABNORMAL LOW (ref 8.9–10.3)
Chloride: 104 mmol/L (ref 98–111)
Creatinine, Ser: 2.44 mg/dL — ABNORMAL HIGH (ref 0.61–1.24)
GFR, Estimated: 27 mL/min — ABNORMAL LOW (ref >60)
Glucose, Bld: 102 mg/dL — ABNORMAL HIGH (ref 70–99)
Potassium: 3.7 mmol/L (ref 3.5–5.1)
Sodium: 138 mmol/L (ref 135–145)

## 2021-03-27 LAB — ECHOCARDIOGRAM COMPLETE: S' Lateral: 5.1 cm

## 2021-03-27 LAB — BRAIN NATRIURETIC PEPTIDE: B Natriuretic Peptide: 107.6 pg/mL — ABNORMAL HIGH (ref 0.0–100.0)

## 2021-03-27 MED ORDER — FUROSEMIDE 20 MG PO TABS: 20.0000 mg | ORAL_TABLET | ORAL | 3 refills | Status: DC

## 2021-03-27 MED ORDER — DAPAGLIFLOZIN PROPANEDIOL 10 MG PO TABS: 10.0000 mg | ORAL_TABLET | Freq: Every day | ORAL | 11 refills | Status: DC

## 2021-03-27 NOTE — Telephone Encounter (Signed)
Eliquis called in to [facility].   Farxiga refill request sent to HF triage to send to [facility].

## 2021-03-27 NOTE — Progress Notes (Signed)
Paramedicine Encounter    Patient ID: Brent [name], male    DOB: 24-Nov-1945, 76 y.o.   MRN: [id number]    Arrived for clinic visit for Mr Brent [name] who reports feeling good today.   Weight today- 168lbs  ECHO 25% today  -Restart Farxiga today -Corlanor Samples obtained and placed in pill box.  -Lasix changed to 42m MWF   Pillbox filled.    Refills: Eliquis  Farxiga     ACTION: Home visit completed

## 2021-03-27 NOTE — Progress Notes (Signed)
Medication Samples have been provided to the patient.  Drug name: Corlanor       Strength: 5 mg        Qty: 4  LOT: [id number]  Exp.Date: 09/2024  Dosing instructions: Take 1 tablet Twice daily   The patient has been instructed regarding the correct time, dose, and frequency of taking this medication, including desired effects and most common side effects.   Brent [name] 1:13 PM 03/27/2021

## 2021-03-27 NOTE — Patient Instructions (Addendum)
EKG done today.  Labs done today. We will contact you only if your labs are abnormal.  RESTART Farxiga 58m (1 tablet) by mouth daily.   DECREASE Lasix to 280m(1 tablet) by ouKatherina Miresvery other day.   No other medication changes were made. Please continue all current medications as prescribed.  Your physician recommends that you schedule a follow-up appointment in: 10 days for a lab only appointment and in 6 weeks with our NP/PA Clinic here in our office.   If you have any questions or concerns before your next appointment please send [place] message through [place] call our office at [phone]   TO LEAVE A MESSAGE FOR THE NURSE SELECT OPTION 2, PLEASE LEAVE A MESSAGE INCLUDING: YOUR NAME DATE OF BIRTH CALL BACK NUMBER REASON FOR CALL**this is important as we prioritize the call backs  YOU WILL RECEIVE A CALL BACK THE SAME DAY AS LONG AS YOU CALL BEFORE 4:00 PM   Do the following things EVERYDAY: Weigh yourself in the morning before breakfast. Write it down and keep it in a log. Take your medicines as prescribed Eat low salt foods--Limit salt (sodium) to 2000 mg per day.  Stay as active as you can everyday Limit all fluids for the day to less than 2 liters   At the [facility] and your health needs are our priority. As part of our continuing mission to provide you with exceptional heart care, we have created designated Provider Care Teams. These Care Teams include your primary Cardiologist (physician) and Advanced Practice Providers (APPs- Physician Assistants and Nurse Practitioners) who all work together to provide you with the care you need, when you need it.   You may see any of the following providers on your designated Care Team at your next follow up: Dr DaGlori Bickersr DaHaynes KernsNP BrLyda JesterPAUtahaAudry RilesPharmD   Please be sure to bring in all your medications bottles to every appointment.

## 2021-03-28 NOTE — Progress Notes (Signed)
PCP: Sandrea Hughs, NP Cardiology: Dr. Leandro Reasoner Brent [name] is a 76 y.o. with a history of Crohn's disease, celiac disease, HTN, COPD, prior tobacco use, CKD 3, chronic anemia, and bipolar disorder. Diagnosed with systolic HF in 0/09 (echo with EF 30-35%).     Admitted 3/11-3/16/20 with SOB and hypoxia. Found to have PNA and required BIPAP and antibiotics. Blood cultures were negative. Echo showed newly reduced EF 30-35%. Cardiology consulted and he had nuclear stress test, which was read as "high risk" due to low EF. Per Dr Sallyanne Kuster, did not think changes were indicative of coronary disease and cath was not pursued due this and due to CKD. HF meds optimized. Limited with CKD3. He had one short run of NSVT. Also had AKI on CKD3, but creatinine improved to baseline 1.77 on day of discharge.   He was seen in ED 05/12/18 with SOB after misplacing his nebulizer. BNP was elevated, so he was given 40 mg IV lasix and discharged with lasix 20 mg BID. Referred to HF clinic.   He was admitted again in 7/20 with weakness, orthostatic hypotension, and AKI.  Cardiac meds were held.  He was noted to be severely deconditioned, and he was discharged to SNF.  He remained in SNF x 2 wks and is now home.   Echo was done in 8/20, EF 20-25% with mild LVH and normal RV.  He had RHC in 8/20 showing normal filling pressures and good cardiac output (surprisingly).   LHC/RHC in 3/21 showed no significant CAD, low filling pressures, preserved cardiac output. Echo in 9/21 with EF 25-30%.  Medtronic ICD placed in 11/21.   Patient was admitted in 1/23 with AKI and hyperkalemia. He was gently hydrated and sent home.    Echo was done today and reviewed, EF 20-25%, global HK, normal RV, IVC normal.   He returns today for followup of CHF.  He is back on all his home meds form prior to 1/23 admission except for Farxiga.  He tends to follow a high sodium diet.  He is short of breath after walking 200 feet, this is chronic.  No  orthopnea/PND.  No chest pain.  No lightheadedness.  Weight is stable.     ECG (personally reviewed): NSR, LVH  Labs (7/20): K 5.1, creatinine 2.25 Labs (8/20): K 4.8, creatinine 2.65 Labs (9/20): hgb 9.7 Labs (12/20): K 4.1, creatinine 1.76 Labs (3/21): K 5, creatinine 2.25 Labs (6/21): hgb 12 Labs (11/21): K 4.3, creatinine 2.62, hgb 12.2 Labs (10/22): hgb 10.8, K 4.8, creatinine 2.73 Labs (1/23): K 4.3, creatinine 1.8  PMH: 1. Crohns disease: Gets Remicade.  2. Celiac disease.  3. COPD: Prior smoker.  - CT chest (7/20) with mild emphysema.  - PFTs (9/20): Mild obstruction, mild restriction.  - High resolution CT chest (10/20): No ILD, +chronic bronchitis.  4. CKD: Stage 3.  5. HTN 6. Bipolar disorder 7. H/o BPPV 8. Gout 9. Chronic systolic CHF: Nonischemic cardiomyopathy.  Medtronic ICD.  - Echo (3/20): EF 30-35%, mild LVH, normal RV.  - Cardiolite (3/20): EF 28%, no ischemia, fixed defects in anteroseptal and inferolateral walls.  - Echo (8/20): EF 20-25%, diffuse hypokinesis, mild LVH, normal RV size and systolic function, normal IVC size.  - RHC (8/20): mean RA 3, PA 21/10, mean PCWP 5, CI 3.2 Fick and 3.5 Thermo - RHC/LHC (3/21): No significant CAD; mean RA 1, PA 20/6, mean PCWP 6, CI 4.57 - Echo (9/21): EF 25-30%, normal RV.  - Echo (  2/23): EF 20-25%, normal RV.  10. Atrial fibrillation: Paroxysmal.   Social History   Socioeconomic History   Marital status: Divorced    Spouse name: Not on file   Number of children: 1   Years of education: 12   Highest education level: Not on file  Occupational History   Occupation: retired   Occupation: Veteran  Tobacco Use   Smoking status: Former    Packs/day: 1.00    Years: 49.00    Pack years: 49.00    Types: Cigarettes    Start date: 04/11/1956    Quit date: 04/17/2014    Years since quitting: 6.9   Smokeless tobacco: Never  Vaping Use   Vaping Use: Never used  Substance and Sexual Activity   Alcohol use: No     Alcohol/week: 0.0 standard drinks   Drug use: No   Sexual activity: Never  Other Topics Concern   Not on file  Social History Narrative   Not on file   Social Determinants of Health   Financial Resource Strain: Not on file  Food Insecurity: No Food Insecurity   Worried About Charity fundraiser in the Last Year: Never true   [place] in the Last Year: Never true  Transportation Needs: No Transportation Needs   Lack of Transportation (Medical): No   Lack of Transportation (Non-Medical): No  Physical Activity: Not on file  Stress: Not on file  Social Connections: Not on file  Intimate Partner Violence: Not on file   Family History  Problem Relation Age of Onset   Diabetes Mother        maternal grandmother   Uterine cancer Mother    Emphysema Father    Pneumonia Maternal Grandmother    Colon cancer Neg Hx    ROS: All systems reviewed and negative except as per HPI.   Current Outpatient Medications  Medication Sig Dispense Refill   acetaminophen (TYLENOL) 325 MG tablet Take 650 mg by mouth every 6 (six) hours as needed.     allopurinol (ZYLOPRIM) 100 MG tablet TAKE 2 TABLETS(200 MG) BY MOUTH DAILY 60 tablet 0   apixaban (ELIQUIS) 5 MG TABS tablet Take 1 tablet (5 mg total) by mouth 2 (two) times daily. 60 tablet 0   budesonide (ENTOCORT EC) 3 MG 24 hr capsule Take 3 capsules (9 mg total) by mouth daily. 90 capsule 0   Cholecalciferol (VITAMIN D) 125 MCG (5000 UT) CAPS Take 5,000 Units by mouth daily. 30 capsule 0   dapagliflozin propanediol (FARXIGA) 10 MG TABS tablet Take 1 tablet (10 mg total) by mouth daily before breakfast. 30 tablet 11   divalproex (DEPAKOTE ER) 500 MG 24 hr tablet TAKE 3 TABLETS(1500 MG) BY MOUTH AT BEDTIME 90 tablet 0   epoetin alfa (EPOGEN) 10000 UNIT/ML injection 10,000 Units every 30 (thirty) days.     ferrous sulfate 325 (65 FE) MG tablet Take 1 tablet (325 mg total) by mouth daily. 30 tablet 0   inFLIXimab (REMICADE) 100 MG injection Infuse  Remicade IV schedule 1 39m/kg every 8 weeks Premedicate with Tylenol 500-6551mby mouth and Benadryl 25-5070my mouth prior to infusion. Last PPD was on 12/2009.  (Patient taking differently: Inject 1.5 mg/kg into the vein every 2 (two) months. Infuse Remicade IV schedule 1 5mg96m every 8 weeks Premedicate with Tylenol 500-650mg65mmouth and Benadryl 25-50mg 17mouth prior to infusion. Last PPD was on 12/2009.) 1 each 6   isosorbide mononitrate (IMDUR) 60 MG 24  hr tablet Take 1 tablet (60 mg total) by mouth daily. 90 tablet 3   ivabradine (CORLANOR) 5 MG TABS tablet TAKE 1 TABLET(5 MG) BY MOUTH TWICE DAILY WITH A MEAL 180 tablet 3   loperamide (IMODIUM) 2 MG capsule Take 1 capsule (2 mg total) by mouth 3 (three) times daily as needed for diarrhea or loose stools. 30 capsule 0   magnesium oxide (MAG-OX) 400 MG tablet Take 1 tablet (400 mg total) by mouth in the morning, at noon, and at bedtime. 90 tablet 0   metoprolol succinate (TOPROL-XL) 25 MG 24 hr tablet Take 1.5 tablets (37.5 mg total) by mouth in the morning and at bedtime. 90 tablet 6   mirtazapine (REMERON) 7.5 MG tablet TAKE 1 TABLET(7.5 MG) BY MOUTH AT BEDTIME 30 tablet 0   Multiple Vitamin (MULTIVITAMIN WITH MINERALS) TABS tablet Take 1 tablet by mouth daily.     omeprazole (PRILOSEC OTC) 20 MG tablet Take 1 tablet (20 mg total) by mouth daily. For GERD 30 tablet 0   QUEtiapine (SEROQUEL) 100 MG tablet Take 1.5 tablets (150 mg total) by mouth at bedtime. 45 tablet 5   solifenacin (VESICARE) 5 MG tablet TAKE 1 TABLET(5 MG) BY MOUTH DAILY 30 tablet 0   spironolactone (ALDACTONE) 25 MG tablet Take 0.5 tablets (12.5 mg total) by mouth daily. 15 tablet 6   tamsulosin (FLOMAX) 0.4 MG CAPS capsule Take 1 capsule (0.4 mg total) by mouth daily. 30 capsule 0   vitamin B-12 (CYANOCOBALAMIN) 500 MCG tablet Take 2 tablets (1,000 mcg total) by mouth daily. 60 tablet 0   furosemide (LASIX) 20 MG tablet Take 1 tablet (20 mg total) by mouth every other  day. 45 tablet 3   No current facility-administered medications for this encounter.   BP 104/70    Pulse 85    Wt 76.3 kg (168 lb 3.2 oz)    SpO2 93%    BMI 20.47 kg/m  General: NAD Neck: No JVD, no thyromegaly or thyroid nodule.  Lungs: Clear to auscultation bilaterally with normal respiratory effort. CV: Nondisplaced PMI.  Heart regular S1/S2, no S3/S4, no murmur. 1+ ankle edema.  No carotid bruit.  Normal pedal pulses.  Abdomen: Soft, nontender, no hepatosplenomegaly, no distention.  Skin: Intact without lesions or rashes.  Neurologic: Alert and oriented x 3.  Psych: Normal affect. Extremities: No clubbing or cyanosis.  HEENT: Normal.   Assessment/Plan: 1. Chronic systolic CHF: Nonischemic cardiomyoapthy.  Diagnosed by echo in 3/20, EF 30-35%.  LHC in 3/21 with no significant CAD. He has no chest pain.  Medication titration has been limited by orthostasis/low BP.  Echo in 8/20 showed EF 20-25% with mild LVH and normal RV function.  With resting sinus tachycardia, I was concerned for low output HF, but RHC in 8/20 and again in 3/21 showed normal cardiac output and filling pressures. Echo in 9/21 with EF 25-30%, Medtronic ICD placed.  Echo 2/23 with EF 20-25%.  NYHA class II, not volume overloaded by exam.   Weight stable. Has had trouble with low BP and orthostasis in the past, but BP good today.  Also, med titration has been limited by CKD.  - Cannot tolerate hydralazine due to orthostatic symptoms.  - Continue Toprol XL 37.5 mg bid.  - Continue ivabradine 5 mg bid.   - Restart Farxiga 10 mg daily and decrease Lasix to 20 mg every other day.  BMET/BNP today and BMET 10 days.  - No ACEI/ARB/ARNI with elevated creatinine.  - Continue  spironolactone 12.5 daily.  - He would be a difficult candidate for advanced therapies with deconditioning, renal dysfunction, and lives alone.  2. CKD: Stage 3.  BMET today.  3. COPD: Mild emphysema on prior chest CT.  PFTs in 9/20 showed mild obstruction  and mild restriction.  High resolution CT chest 10/20 showed no ILD, +chronic bronchitis.   Followup with NP in 6 wks.    Loralie Champagne 03/28/2021

## 2021-04-01 ENCOUNTER — Ambulatory Visit (INDEPENDENT_AMBULATORY_CARE_PROVIDER_SITE_OTHER): Payer: No Typology Code available for payment source

## 2021-04-01 DIAGNOSIS — I5022 Chronic systolic (congestive) heart failure: Secondary | ICD-10-CM

## 2021-04-01 DIAGNOSIS — Z95810 Presence of automatic (implantable) cardiac defibrillator: Secondary | ICD-10-CM | POA: Diagnosis not present

## 2021-04-02 ENCOUNTER — Ambulatory Visit (INDEPENDENT_AMBULATORY_CARE_PROVIDER_SITE_OTHER): Payer: No Typology Code available for payment source

## 2021-04-02 DIAGNOSIS — I472 Ventricular tachycardia, unspecified: Secondary | ICD-10-CM

## 2021-04-02 LAB — CUP PACEART REMOTE DEVICE CHECK
Battery Remaining Longevity: 130 mo
Battery Voltage: 3.01 V
Brady Statistic RV Percent Paced: 0 %
Date Time Interrogation Session: 20230207054345
HighPow Impedance: 56 Ohm
Implantable Lead Implant Date: 20211108
Implantable Lead Location: 753860
Implantable Pulse Generator Implant Date: 20211108
Lead Channel Impedance Value: 304 Ohm
Lead Channel Impedance Value: 361 Ohm
Lead Channel Pacing Threshold Amplitude: 0.875 V
Lead Channel Pacing Threshold Pulse Width: 0.4 ms
Lead Channel Sensing Intrinsic Amplitude: 13.25 mV
Lead Channel Sensing Intrinsic Amplitude: 13.25 mV
Lead Channel Setting Pacing Amplitude: 2 V
Lead Channel Setting Pacing Pulse Width: 0.4 ms
Lead Channel Setting Sensing Sensitivity: 0.3 mV

## 2021-04-02 NOTE — Progress Notes (Signed)
EPIC Encounter for ICM Monitoring  Patient Name: Brent [name] is a 76 y.o. male Date: 04/02/2021 Primary Care Physican: Sandrea Hughs, NP Primary Cardiologist: [name] Electrophysiologist: Lovena Le 04/02/2021 Weight: 170.2 lbs                                                            1st ICM Remote Transmission.  Heart Failure questions reviewed.  Pt has minimal swelling in feet but is not a new symptom.  Wt stable and denies SOB.   Optivol thoracic impedance suggesting possible fluid accumulation starting 2/2 which correlates with lasix changed to every other day after starting Farxiga (Per 2/1 OV note).    Prescribed:  Furosemide 20 mg take 1 tablet (20 mg total) by mouth every other day (decreased 2/1 after starting Farxiga) Farxiga 10 mg take 1 tablet by mouth daily before breakfast. Spironolactone 25 mg take 0.5 tablet (12.5 mg total) daily   Labs: 03/27/2021 Creatinine 2.44, BUN 35, Potassium 3.7, Sodium 138,  03/08/2021 Creatinine 1.8,   BUN 27, Potassium 4.3, Sodium 144 03/01/2021 Creatinine 2.30, BUN 33, Potassium 4.6, Sodium 137 02/28/2021 Creatinine 2.81, BUN 35, Potassium 5.5, Sodium 138 02/27/2021 Creatinine 3.01, BUN 36, Potassium 5.7, Sodium 134 02/26/2021 Creatinine 3.34, BUN 33, Potassium 5.2, Sodium 136 02/25/2021 Creatinine 3.62, BUN 32, Potassium 6.1, Sodium 136 A complete set of results can be found in Results Review.  Recommendations: Reinforced limiting salt intake.  Copy sent to Dr [name] for review and recommendations if needed.   Follow-up plan: ICM clinic phone appointment on 04/09/2021 to recheck fluid levels.   91 day device clinic remote transmission 07/02/2021.     EP/Cardiology Office Visits: 05/08/2021 with [facility] NP/PA.     Copy of ICM check sent to Dr. Lovena Le.   3 month ICM trend: 04/02/2021.    12-14 Month ICM trend:     Rosalene Billings, RN 04/02/2021 11:07 AM

## 2021-04-03 ENCOUNTER — Other Ambulatory Visit (HOSPITAL_COMMUNITY): Payer: Self-pay

## 2021-04-03 ENCOUNTER — Other Ambulatory Visit: Payer: Self-pay | Admitting: Family

## 2021-04-03 DIAGNOSIS — R630 Anorexia: Secondary | ICD-10-CM

## 2021-04-03 MED ORDER — MIRTAZAPINE 7.5 MG PO TABS: ORAL_TABLET | ORAL | 3 refills | Status: AC

## 2021-04-03 NOTE — Progress Notes (Signed)
Paramedicine Encounter    Patient ID: Brent [name], male    DOB: 14-Nov-1945, 76 y.o.   MRN: [id number]  Arrived for home visit for [name] who reports he is feeling good with no complaints. He denied any shortness of breath, dizziness, chest pain or trouble sleeping. He did report some lower leg swelling. He admits to eating some salt but says he is going to work hard to decrease his sodium intake. We discussed this at length. He verbalized understanding. Assessment and vitals obtained as noted.   I reviewed medications and filled pill box for one week.   Refills: Remeron  (Out of refills, sent request to PCP)  We reviewed upcoming appointments. He confirmed same in his planner.   Home visit complete. I will see [name] in one week.   Patient Care Team: [name], [name], NP as PCP - General (Family Medicine) [place], Peter M, MD as PCP - Cardiology (Cardiology) Larey Dresser, MD as PCP - Advanced Heart Failure (Cardiology) Elmarie Shiley, MD (Nephrology) Milus Banister, MD (Gastroenterology) Jorge Ny, LCSW as Social Worker (Licensed Clinical Social Worker) Tobi Bastos, RN as [place] Management  Patient Active Problem List   Diagnosis Date Noted   Acute kidney injury superimposed on chronic kidney disease ([place]) 02/25/2021   Generalized weakness 02/25/2021   SIRS (systemic inflammatory response syndrome) ([place]) 10/31/2020   Neurocognitive deficits 10/17/2020   Hypokalemia 10/08/2020   Hypomagnesemia 10/08/2020   CKD (chronic kidney disease), stage III ([place]) 10/08/2020   COVID-19 virus infection 10/08/2020   Electrolyte abnormality 05/17/2020   ICD (implantable cardioverter-defibrillator) in place 08/17/7626   Chronic systolic heart failure ([place]) 01/02/2020   Decreased appetite 10/08/2019   Weakness 10/08/2019   Hyperkalemia 31/51/7616   Chronic systolic CHF (congestive heart failure) ([place]) 08/26/2019   Type 2 diabetes mellitus with stage 3  chronic kidney disease ([place]) 08/26/2019   Chronic obstructive pulmonary disease ([place]) 06/20/2019   Ventricular tachycardia 06/20/2019   Acute on chronic systolic (congestive) heart failure ([place]) 07/37/1062   Metabolic acidosis 69/48/5462   Orthostasis 09/07/2018   AKI (acute kidney injury) ([place])    Immunosuppressed status ([place])    Macrocytic anemia    Chronic combined systolic and diastolic congestive heart failure ([place]) 06/03/2018   Candida esophagitis ([place]) 09/22/2017   History of smoking 30 or more pack years 01/05/2017   Bipolar 1 disorder ([place])    BPH (benign prostatic hyperplasia) 03/31/2013   Anemia in chronic kidney disease 03/31/2013   Essential hypertension, benign 03/31/2013   Acute renal failure superimposed on stage 3 chronic kidney disease (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    Current Outpatient Medications:    acetaminophen (TYLENOL) 325 MG tablet, Take 650 mg by mouth every 6 (six) hours as needed., Disp: , Rfl:    allopurinol (ZYLOPRIM) 100 MG tablet, TAKE 2 TABLETS(200 MG) BY MOUTH DAILY, Disp: 60 tablet, Rfl: 0   apixaban (ELIQUIS) 5 MG TABS tablet, Take 1 tablet (5 mg total) by mouth 2 (two) times daily., Disp: 60 tablet, Rfl: 0   budesonide (ENTOCORT EC) 3 MG 24 hr capsule, Take 3 capsules (9 mg total) by mouth daily., Disp: 90 capsule, Rfl: 0   Cholecalciferol (VITAMIN D) 125 MCG (5000 UT) CAPS, Take 5,000 Units by mouth daily., Disp: 30 capsule, Rfl: 0   dapagliflozin propanediol (FARXIGA) 10 MG TABS tablet, Take 1 tablet (10 mg total) by mouth daily before breakfast., Disp: 30 tablet, Rfl:  11   divalproex (DEPAKOTE ER) 500 MG 24 hr tablet, TAKE 3 TABLETS(1500 MG) BY MOUTH AT BEDTIME, Disp: 90 tablet, Rfl: 0   epoetin alfa (EPOGEN) 10000 UNIT/ML injection, 10,000 Units every 30 (thirty) days., Disp: , Rfl:    ferrous sulfate 325 (65 FE) MG tablet, Take 1 tablet (325 mg total) by mouth daily., Disp: 30 tablet, Rfl: 0   furosemide (LASIX)  20 MG tablet, Take 1 tablet (20 mg total) by mouth every other day., Disp: 45 tablet, Rfl: 3   inFLIXimab (REMICADE) 100 MG injection, Infuse Remicade IV schedule 1 36m/kg every 8 weeks Premedicate with Tylenol 500-6572mby mouth and Benadryl 25-5018my mouth prior to infusion. Last PPD was on 12/2009.  (Patient taking differently: Inject 1.5 mg/kg into the vein every 2 (two) months. Infuse Remicade IV schedule 1 5mg73m every 8 weeks Premedicate with Tylenol 500-650mg20mmouth and Benadryl 25-50mg 58mouth prior to infusion. Last PPD was on 12/2009.), Disp: 1 each, Rfl: 6   isosorbide mononitrate (IMDUR) 60 MG 24 hr tablet, Take 1 tablet (60 mg total) by mouth daily., Disp: 90 tablet, Rfl: 3   ivabradine (CORLANOR) 5 MG TABS tablet, TAKE 1 TABLET(5 MG) BY MOUTH TWICE DAILY WITH A MEAL, Disp: 180 tablet, Rfl: 3   loperamide (IMODIUM) 2 MG capsule, Take 1 capsule (2 mg total) by mouth 3 (three) times daily as needed for diarrhea or loose stools., Disp: 30 capsule, Rfl: 0   magnesium oxide (MAG-OX) 400 MG tablet, Take 1 tablet (400 mg total) by mouth in the morning, at noon, and at bedtime., Disp: 90 tablet, Rfl: 0   metoprolol succinate (TOPROL-XL) 25 MG 24 hr tablet, Take 1.5 tablets (37.5 mg total) by mouth in the morning and at bedtime., Disp: 90 tablet, Rfl: 6   mirtazapine (REMERON) 7.5 MG tablet, TAKE 1 TABLET(7.5 MG) BY MOUTH AT BEDTIME, Disp: 30 tablet, Rfl: 0   Multiple Vitamin (MULTIVITAMIN WITH MINERALS) TABS tablet, Take 1 tablet by mouth daily., Disp: , Rfl:    omeprazole (PRILOSEC OTC) 20 MG tablet, Take 1 tablet (20 mg total) by mouth daily. For GERD, Disp: 30 tablet, Rfl: 0   QUEtiapine (SEROQUEL) 100 MG tablet, Take 1.5 tablets (150 mg total) by mouth at bedtime., Disp: 45 tablet, Rfl: 5   solifenacin (VESICARE) 5 MG tablet, TAKE 1 TABLET(5 MG) BY MOUTH DAILY, Disp: 30 tablet, Rfl: 0   spironolactone (ALDACTONE) 25 MG tablet, Take 0.5 tablets (12.5 mg total) by mouth daily., Disp: 15  tablet, Rfl: 6   tamsulosin (FLOMAX) 0.4 MG CAPS capsule, Take 1 capsule (0.4 mg total) by mouth daily., Disp: 30 capsule, Rfl: 0   vitamin B-12 (CYANOCOBALAMIN) 500 MCG tablet, Take 2 tablets (1,000 mcg total) by mouth daily., Disp: 60 tablet, Rfl: 0 Allergies  Allergen Reactions   Azathioprine Other (See Comments)    REACTION: affected WBC "Almost died"   Ciprofloxacin Other (See Comments)    Reaction not recalled   Levaquin [Levofloxacin In D5w] Other (See Comments)    Reaction not rec   Plendil [Felodipine] Other (See Comments)    Reaction not not recalled     Social History   Socioeconomic History   Marital status: Divorced    Spouse name: Not on file   Number of children: 1   Years of education: 12   Highest education level: Not on file  Occupational History   Occupation: retired   Occupation: Veteran  Tobacco Use   Smoking status: Former  Packs/day: 1.00    Years: 49.00    Pack years: 49.00    Types: Cigarettes    Start date: 04/11/1956    Quit date: 04/17/2014    Years since quitting: 6.9   Smokeless tobacco: Never  Vaping Use   Vaping Use: Never used  Substance and Sexual Activity   Alcohol use: No    Alcohol/week: 0.0 standard drinks   Drug use: No   Sexual activity: Never  Other Topics Concern   Not on file  Social History Narrative   Not on file   Social Determinants of Health   Financial Resource Strain: Not on file  Food Insecurity: No Food Insecurity   Worried About Charity fundraiser in the Last Year: Never true   Ran Out of Food in the Last Year: Never true  Transportation Needs: No Transportation Needs   Lack of Transportation (Medical): No   Lack of Transportation (Non-Medical): No  Physical Activity: Not on file  Stress: Not on file  Social Connections: Not on file  Intimate Partner Violence: Not on file    Physical Exam Vitals reviewed.  Constitutional:      Appearance: Normal appearance. He is normal weight.  HENT:     Head:  Normocephalic.     Nose: Nose normal.     Mouth/Throat:     Mouth: Mucous membranes are moist.     Pharynx: Oropharynx is clear.  Eyes:     Conjunctiva/sclera: Conjunctivae normal.     Pupils: Pupils are equal, round, and reactive to light.  Cardiovascular:     Rate and Rhythm: Normal rate and regular rhythm.     Pulses: Normal pulses.     Heart sounds: Normal heart sounds.  Pulmonary:     Effort: Pulmonary effort is normal.     Breath sounds: Normal breath sounds.  Abdominal:     General: Abdomen is flat.     Palpations: Abdomen is soft.  Musculoskeletal:        General: Swelling present. Normal range of motion.     Cervical back: Normal range of motion.     Right lower leg: Edema present.     Left lower leg: Edema present.  Skin:    General: Skin is warm and dry.     Capillary Refill: Capillary refill takes less than 2 seconds.  Neurological:     General: No focal deficit present.     Mental Status: He is alert. Mental status is at baseline.  Psychiatric:        Mood and Affect: Mood normal.        Future Appointments  Date Time Provider [place]  04/08/2021  9:00 AM [facility] LAB PSC-[facility] None  04/08/2021  3:45 PM [facility] LAB [facility] None  04/09/2021  7:00 AM [facility] DEVICE REMOTES [facility]  04/12/2021  2:15 PM [name], [name], NP PSC-[facility] None  04/17/2021  1:00 PM Tobi Bastos, RN [facility] None  04/19/2021 10:00 AM [facility] RM 1 WL-[facility] None  05/08/2021  3:30 PM [facility] PA/NP [facility] None  07/02/2021  7:00 AM [facility] DEVICE REMOTES [facility]  01/31/2022  3:45 PM [name], [name], NP [facility]SC-[facility] None     ACTION: Home visit completed

## 2021-04-04 NOTE — Progress Notes (Signed)
Go back to Lasix 20 mg daily, BMET 1 week.

## 2021-04-05 ENCOUNTER — Other Ambulatory Visit: Payer: Self-pay | Admitting: Family

## 2021-04-05 DIAGNOSIS — I5022 Chronic systolic (congestive) heart failure: Secondary | ICD-10-CM

## 2021-04-05 DIAGNOSIS — E559 Vitamin D deficiency, unspecified: Secondary | ICD-10-CM

## 2021-04-05 DIAGNOSIS — I10 Essential (primary) hypertension: Secondary | ICD-10-CM

## 2021-04-05 DIAGNOSIS — D539 Nutritional anemia, unspecified: Secondary | ICD-10-CM

## 2021-04-05 DIAGNOSIS — K219 Gastro-esophageal reflux disease without esophagitis: Secondary | ICD-10-CM

## 2021-04-05 DIAGNOSIS — I480 Paroxysmal atrial fibrillation: Secondary | ICD-10-CM

## 2021-04-05 DIAGNOSIS — F319 Bipolar disorder, unspecified: Secondary | ICD-10-CM

## 2021-04-05 MED ORDER — FUROSEMIDE 20 MG PO TABS: 20.0000 mg | ORAL_TABLET | Freq: Every day | ORAL | 3 refills | Status: DC

## 2021-04-05 NOTE — Progress Notes (Signed)
Remote ICD transmission.   

## 2021-04-05 NOTE — Progress Notes (Signed)
Spoke with patient.  Advised of Dr Claris Gladden recommendations to change Lasix to 20 mg 1 tablet daily.  He verbalized understanding and is in the paramedicine program.  Advised sent message to [facility] Saner EMT that the change in lasix dosage.  Also advised BMET is needed in a week.  He said he was scheduled for labs at 2 different places on Monday 13th and senior transportation will charge him a fee for transportation to 2 places in one day.  He asked if the 13th lab appointment be changed.  BMET was rescheduled for 2/13 to 2/17 as requested.

## 2021-04-08 ENCOUNTER — Other Ambulatory Visit: Payer: Self-pay

## 2021-04-08 ENCOUNTER — Other Ambulatory Visit (HOSPITAL_COMMUNITY): Payer: Medicare Other

## 2021-04-08 ENCOUNTER — Other Ambulatory Visit: Payer: Medicare Other

## 2021-04-08 DIAGNOSIS — D539 Nutritional anemia, unspecified: Secondary | ICD-10-CM

## 2021-04-08 DIAGNOSIS — K219 Gastro-esophageal reflux disease without esophagitis: Secondary | ICD-10-CM

## 2021-04-08 DIAGNOSIS — I5022 Chronic systolic (congestive) heart failure: Secondary | ICD-10-CM | POA: Diagnosis not present

## 2021-04-08 DIAGNOSIS — I10 Essential (primary) hypertension: Secondary | ICD-10-CM | POA: Diagnosis not present

## 2021-04-08 DIAGNOSIS — E559 Vitamin D deficiency, unspecified: Secondary | ICD-10-CM

## 2021-04-08 DIAGNOSIS — I480 Paroxysmal atrial fibrillation: Secondary | ICD-10-CM

## 2021-04-08 DIAGNOSIS — F319 Bipolar disorder, unspecified: Secondary | ICD-10-CM

## 2021-04-09 ENCOUNTER — Ambulatory Visit (INDEPENDENT_AMBULATORY_CARE_PROVIDER_SITE_OTHER): Payer: Medicare Other

## 2021-04-09 DIAGNOSIS — J449 Chronic obstructive pulmonary disease, unspecified: Secondary | ICD-10-CM | POA: Diagnosis not present

## 2021-04-09 DIAGNOSIS — N1832 Chronic kidney disease, stage 3b: Secondary | ICD-10-CM | POA: Diagnosis not present

## 2021-04-09 DIAGNOSIS — I5022 Chronic systolic (congestive) heart failure: Secondary | ICD-10-CM | POA: Diagnosis not present

## 2021-04-09 DIAGNOSIS — I480 Paroxysmal atrial fibrillation: Secondary | ICD-10-CM | POA: Diagnosis not present

## 2021-04-09 DIAGNOSIS — D631 Anemia in chronic kidney disease: Secondary | ICD-10-CM | POA: Diagnosis not present

## 2021-04-09 DIAGNOSIS — Z95810 Presence of automatic (implantable) cardiac defibrillator: Secondary | ICD-10-CM

## 2021-04-09 DIAGNOSIS — I130 Hypertensive heart and chronic kidney disease with heart failure and stage 1 through stage 4 chronic kidney disease, or unspecified chronic kidney disease: Secondary | ICD-10-CM | POA: Diagnosis not present

## 2021-04-09 DIAGNOSIS — K900 Celiac disease: Secondary | ICD-10-CM | POA: Diagnosis not present

## 2021-04-09 DIAGNOSIS — E876 Hypokalemia: Secondary | ICD-10-CM | POA: Diagnosis not present

## 2021-04-09 NOTE — Progress Notes (Signed)
EPIC Encounter for ICM Monitoring  Patient Name: Brent [name] is a 76 y.o. male Date: 04/09/2021 Primary Care Physican: Sandrea Hughs, NP Primary Cardiologist: [name] Electrophysiologist: Lovena Le 04/02/2021 Weight: 170.2 lbs                                                            1st ICM Remote Transmission.  Heart Failure questions reviewed.  Pt reports more swelling in feet.  Pt is unable to make adjustments to his meds because he lives alone and relies on EMT paramedicine program to make changes but she only comes to the home on Wednesdays.  He has not started taking Lasix daily as recommended by Dr [name] on 2/10.     Optivol thoracic impedance suggesting possible fluid accumulation starting 2/2 which correlates with lasix changed to every other day after starting Farxiga (Per 2/1 OV note).    Prescribed:  Furosemide 20 mg take 1 tablet (20 mg total) by mouth daily (changed to daily 2/10) Farxiga 10 mg take 1 tablet by mouth daily before breakfast. Spironolactone 25 mg take 0.5 tablet (12.5 mg total) daily   Labs: 03/27/2021 Creatinine 2.44, BUN 35, Potassium 3.7, Sodium 138,  03/08/2021 Creatinine 1.8,   BUN 27, Potassium 4.3, Sodium 144 03/01/2021 Creatinine 2.30, BUN 33, Potassium 4.6, Sodium 137 02/28/2021 Creatinine 2.81, BUN 35, Potassium 5.5, Sodium 138 02/27/2021 Creatinine 3.01, BUN 36, Potassium 5.7, Sodium 134 02/26/2021 Creatinine 3.34, BUN 33, Potassium 5.2, Sodium 136 02/25/2021 Creatinine 3.62, BUN 32, Potassium 6.1, Sodium 136 A complete set of results can be found in Results Review.   Recommendations:  Message sent to [facility], EMT paramedicine program to update pt's Lasix prescription on her visit 2/15.   Will recheck fluid levels 2/27 after pt starting Lasix daily as prescribed.     Follow-up plan: ICM clinic phone appointment on 04/22/2021 to recheck fluid levels.   91 day device clinic remote transmission 07/02/2021.     EP/Cardiology Office Visits: 05/08/2021  with [facility] NP/PA.     Copy of ICM check sent to Dr. Lovena Le.   3 month ICM trend: 04/09/2021.    12-14 Month ICM trend:     Rosalene Billings, RN 04/09/2021 2:09 PM

## 2021-04-10 ENCOUNTER — Other Ambulatory Visit (HOSPITAL_COMMUNITY): Payer: Self-pay

## 2021-04-10 NOTE — Progress Notes (Signed)
Message from [place] at [facility] confirmed by message she would update pt's pill box on 2/15.  She reports [name] the home health nurse can make immediate adjustments to meds and can be reached by phone at [phone].

## 2021-04-10 NOTE — Progress Notes (Signed)
Paramedicine Encounter    Patient ID: Brent [name], male    DOB: [birth year], 76 y.o.   MRN: [id number]  Arrived for home visit for Brent [name] who reports feeling good today with no complaints of dizziness, chest pain or increased shortness of breath. He did state earlier this week he got short of breath while walking up a hill to get to his car. He does have some lower leg edema today. Lungs are clear. Vital as noted. He reports taking his meds at 1000 this morning.   Weight is up 4 lbs and lower leg edema is noted. Dr. [name] increased his Lasix to 29m daily rather than MWF. I gave him his meds for today.   I confirmed meds and filled pill box for one week. We reviewed appointments and confirmed same.   I will see Brent [name] one week. He knows to reach out to HF clinic or me if he needs anything further. He was reminded to adhere to the low salt diet and to be sure to avoid extra fluids. He agreed.  Home visit complete.   Patient Care Team: [name], DNelda Bucks NP as PCP - General (Family Medicine) JMartinique Peter M, MD as PCP - Cardiology (Cardiology) MLarey Dresser MD as PCP - Advanced Heart Failure (Cardiology) PElmarie Shiley MD (Nephrology) JMilus Banister MD (Gastroenterology) UJorge Ny LCSW as Social Worker (Licensed Clinical Social Worker) MTobi Bastos RN as TOltonManagement  Patient Active Problem List   Diagnosis Date Noted   Acute kidney injury superimposed on chronic kidney disease ([place] 02/25/2021   Generalized weakness 02/25/2021   SIRS (systemic inflammatory response syndrome) ([facility] 10/31/2020   Neurocognitive deficits 10/17/2020   Hypokalemia 10/08/2020   Hypomagnesemia 10/08/2020   CKD (chronic kidney disease), stage III ([place] 10/08/2020   COVID-19 virus infection 10/08/2020   Electrolyte abnormality 05/17/2020   ICD (implantable cardioverter-defibrillator) in place 088/41/6606  Chronic systolic heart failure ([place] 01/02/2020    Decreased appetite 10/08/2019   Weakness 10/08/2019   Hyperkalemia 030/16/0109  Chronic systolic CHF (congestive heart failure) ([place] 08/26/2019   Type 2 diabetes mellitus with stage 3 chronic kidney disease (HW[place] 08/26/2019   Chronic obstructive pulmonary disease (HS[place] 06/20/2019   Ventricular tachycardia 06/20/2019   Acute on chronic systolic (congestive) heart failure ([facility] 032/35/5732  Metabolic acidosis 020/25/4270  Orthostasis 09/07/2018   AKI (acute kidney injury) (HW[place]    Immunosuppressed status ([place]    Macrocytic anemia    Chronic combined systolic and diastolic congestive heart failure (HC[place] 06/03/2018   Candida esophagitis (HR[place] 09/22/2017   History of smoking 30 or more pack years 01/05/2017   Bipolar 1 disorder (HH[place]    BPH (benign prostatic hyperplasia) 03/31/2013   Anemia in chronic kidney disease 03/31/2013   Essential hypertension, benign 03/31/2013   Acute renal failure superimposed on stage 3 chronic kidney disease (HD[place]iels[place] 06/04/2012   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H[place] 01/23/2010    Current Outpatient Medications:    acetaminophen (TYLENOL) 325 MG tablet, Take 650 mg by mouth every 6 (six) hours as needed., Disp: , Rfl:    allopurinol (ZYLOPRIM) 100 MG tablet, TAKE 2 TABLETS(200 MG) BY MOUTH DAILY, Disp: 60 tablet, Rfl: 0   apixaban (ELIQUIS) 5 MG TABS tablet, Take 1 tablet (5 mg total) by mouth 2 (two) times daily., Disp: 60 tablet, Rfl: 0   budesonide (ENTOCORT EC) 3 MG 24 hr capsule, Take 3 capsules (9 mg total) by mouth daily.,  Disp: 90 capsule, Rfl: 0   Cholecalciferol (VITAMIN D) 125 MCG (5000 UT) CAPS, Take 5,000 Units by mouth daily., Disp: 30 capsule, Rfl: 0   dapagliflozin propanediol (FARXIGA) 10 MG TABS tablet, Take 1 tablet (10 mg total) by mouth daily before breakfast., Disp: 30 tablet, Rfl: 11   divalproex (DEPAKOTE ER) 500 MG 24 hr tablet, TAKE 3 TABLETS(1500 MG) BY MOUTH AT BEDTIME, Disp: 90 tablet, Rfl: 0   epoetin alfa (EPOGEN) 10000 UNIT/ML  injection, 10,000 Units every 30 (thirty) days., Disp: , Rfl:    ferrous sulfate 325 (65 FE) MG tablet, Take 1 tablet (325 mg total) by mouth daily., Disp: 30 tablet, Rfl: 0   furosemide (LASIX) 20 MG tablet, Take 1 tablet (20 mg total) by mouth daily., Disp: 90 tablet, Rfl: 3   inFLIXimab (REMICADE) 100 MG injection, Infuse Remicade IV schedule 1 20m/kg every 8 weeks Premedicate with Tylenol 500-6591mby mouth and Benadryl 25-5031my mouth prior to infusion. Last PPD was on 12/2009.  (Patient taking differently: Inject 1.5 mg/kg into the vein every 2 (two) months. Infuse Remicade IV schedule 1 5mg104m every 8 weeks Premedicate with Tylenol 500-650mg28mmouth and Benadryl 25-50mg 43mouth prior to infusion. Last PPD was on 12/2009.), Disp: 1 each, Rfl: 6   isosorbide mononitrate (IMDUR) 60 MG 24 hr tablet, Take 1 tablet (60 mg total) by mouth daily., Disp: 90 tablet, Rfl: 3   ivabradine (CORLANOR) 5 MG TABS tablet, TAKE 1 TABLET(5 MG) BY MOUTH TWICE DAILY WITH A MEAL, Disp: 180 tablet, Rfl: 3   loperamide (IMODIUM) 2 MG capsule, Take 1 capsule (2 mg total) by mouth 3 (three) times daily as needed for diarrhea or loose stools., Disp: 30 capsule, Rfl: 0   magnesium oxide (MAG-OX) 400 MG tablet, Take 1 tablet (400 mg total) by mouth in the morning, at noon, and at bedtime., Disp: 90 tablet, Rfl: 0   metoprolol succinate (TOPROL-XL) 25 MG 24 hr tablet, Take 1.5 tablets (37.5 mg total) by mouth in the morning and at bedtime., Disp: 90 tablet, Rfl: 6   mirtazapine (REMERON) 7.5 MG tablet, TAKE 1 TABLET(7.5 MG) BY MOUTH AT BEDTIME, Disp: 30 tablet, Rfl: 3   Multiple Vitamin (MULTIVITAMIN WITH MINERALS) TABS tablet, Take 1 tablet by mouth daily., Disp: , Rfl:    omeprazole (PRILOSEC OTC) 20 MG tablet, Take 1 tablet (20 mg total) by mouth daily. For GERD, Disp: 30 tablet, Rfl: 0   QUEtiapine (SEROQUEL) 100 MG tablet, Take 1.5 tablets (150 mg total) by mouth at bedtime., Disp: 45 tablet, Rfl: 5   solifenacin  (VESICARE) 5 MG tablet, TAKE 1 TABLET(5 MG) BY MOUTH DAILY, Disp: 30 tablet, Rfl: 0   spironolactone (ALDACTONE) 25 MG tablet, Take 0.5 tablets (12.5 mg total) by mouth daily., Disp: 15 tablet, Rfl: 6   tamsulosin (FLOMAX) 0.4 MG CAPS capsule, Take 1 capsule (0.4 mg total) by mouth daily., Disp: 30 capsule, Rfl: 0   vitamin B-12 (CYANOCOBALAMIN) 500 MCG tablet, Take 2 tablets (1,000 mcg total) by mouth daily., Disp: 60 tablet, Rfl: 0 Allergies  Allergen Reactions   Azathioprine Other (See Comments)    REACTION: affected WBC "Almost died"   Ciprofloxacin Other (See Comments)    Reaction not recalled   Levaquin [Levofloxacin In D5w] Other (See Comments)    Reaction not rec   Plendil [Felodipine] Other (See Comments)    Reaction not not recalled     Social History   Socioeconomic History   Marital status: Divorced  Spouse name: Not on file   Number of children: 1   Years of education: 95   Highest education level: Not on file  Occupational History   Occupation: retired   Occupation: Veteran  Tobacco Use   Smoking status: Former    Packs/day: 1.00    Years: 49.00    Pack years: 49.00    Types: Cigarettes    Start date: 04/11/1956    Quit date: 04/17/2014    Years since quitting: 6.9   Smokeless tobacco: Never  Vaping Use   Vaping Use: Never used  Substance and Sexual Activity   Alcohol use: No    Alcohol/week: 0.0 standard drinks   Drug use: No   Sexual activity: Never  Other Topics Concern   Not on file  Social History Narrative   Not on file   Social Determinants of Health   Financial Resource Strain: Not on file  Food Insecurity: No Food Insecurity   Worried About Charity fundraiser in the Last Year: Never true   [place] in the Last Year: Never true  Transportation Needs: No Transportation Needs   Lack of Transportation (Medical): No   Lack of Transportation (Non-Medical): No  Physical Activity: Not on file  Stress: Not on file  Social Connections:  Not on file  Intimate Partner Violence: Not on file    Physical Exam Vitals reviewed.  Constitutional:      Appearance: Normal appearance. He is normal weight.  HENT:     Head: Normocephalic.     Nose: Nose normal.     Mouth/Throat:     Mouth: Mucous membranes are moist.     Pharynx: Oropharynx is clear.  Eyes:     Conjunctiva/sclera: Conjunctivae normal.     Pupils: Pupils are equal, round, and reactive to light.  Cardiovascular:     Rate and Rhythm: Normal rate and regular rhythm.     Pulses: Normal pulses.     Heart sounds: Normal heart sounds.  Pulmonary:     Effort: Pulmonary effort is normal.     Breath sounds: Normal breath sounds.  Abdominal:     General: Abdomen is flat.     Palpations: Abdomen is soft.  Musculoskeletal:        General: Swelling present. Normal range of motion.     Cervical back: Normal range of motion.     Right lower leg: Edema present.     Left lower leg: Edema present.  Skin:    General: Skin is warm and dry.     Capillary Refill: Capillary refill takes less than 2 seconds.  Neurological:     General: No focal deficit present.     Mental Status: He is alert. Mental status is at baseline.  Psychiatric:        Mood and Affect: Mood normal.        Future Appointments  Date Time Provider [place]  04/12/2021  9:00 AM [facility] LAB [facility] None  04/12/2021  2:15 PM [name], [name], NP P[facility] None  04/17/2021  1:00 PM Tobi Bastos, RN [facility] None  04/19/2021 10:00 AM [facility] RM 1 [facility] None  04/22/2021  7:20 AM [facility] DEVICE REMOTES [facility]  05/08/2021  3:30 PM [facility] PA/NP [facility] None  07/02/2021  7:00 AM [facility] DEVICE REMOTES [facility]  10/01/2021  7:00 AM [facility] DEVICE REMOTES [facility]  12/31/2021  7:00 AM [facility] DEVICE REMOTES [facility]  01/31/2022  3:45 PM [name], Webb Silversmith  C, NP [facility] None  04/01/2022  7:00 AM [facility] DEVICE REMOTES  [facility]  07/01/2022  7:00 AM [facility] DEVICE REMOTES [facility]  09/30/2022  7:00 AM [facility] DEVICE REMOTES [facility]     ACTION: Home visit completed

## 2021-04-11 LAB — COMPLETE METABOLIC PANEL WITH GFR
AG Ratio: 0.9 (calc) — ABNORMAL LOW (ref 1.0–2.5)
ALT: 13 U/L (ref 9–46)
AST: 16 U/L (ref 10–35)
Albumin: 3.1 g/dL — ABNORMAL LOW (ref 3.6–5.1)
Alkaline phosphatase (APISO): 59 U/L (ref 35–144)
BUN/Creatinine Ratio: 19 (calc) (ref 6–22)
BUN: 43 mg/dL — ABNORMAL HIGH (ref 7–25)
CO2: 31 mmol/L (ref 20–32)
Calcium: 8.6 mg/dL (ref 8.6–10.3)
Chloride: 107 mmol/L (ref 98–110)
Creat: 2.24 mg/dL — ABNORMAL HIGH (ref 0.70–1.28)
Globulin: 3.3 g/dL (calc) (ref 1.9–3.7)
Glucose, Bld: 85 mg/dL (ref 65–99)
Potassium: 4.2 mmol/L (ref 3.5–5.3)
Sodium: 144 mmol/L (ref 135–146)
Total Bilirubin: 0.4 mg/dL (ref 0.2–1.2)
Total Protein: 6.4 g/dL (ref 6.1–8.1)
eGFR: 30 mL/min/{1.73_m2} — ABNORMAL LOW (ref >=60)

## 2021-04-11 LAB — MAGNESIUM: Magnesium: 1.2 mg/dL — ABNORMAL LOW (ref 1.5–2.5)

## 2021-04-11 LAB — LIPID PANEL
Cholesterol: 147 mg/dL (ref <200)
HDL: 58 mg/dL (ref >=40)
LDL Cholesterol (Calc): 71 mg/dL (calc)
Non-HDL Cholesterol (Calc): 89 mg/dL (calc) (ref <130)
Total CHOL/HDL Ratio: 2.5 (calc) (ref <5.0)
Triglycerides: 97 mg/dL (ref <150)

## 2021-04-11 LAB — CBC WITH DIFFERENTIAL/PLATELET
Absolute Monocytes: 675 cells/uL (ref 200–950)
Basophils Absolute: 19 cells/uL (ref 0–200)
Basophils Relative: 0.2 %
Eosinophils Absolute: 133 cells/uL (ref 15–500)
Eosinophils Relative: 1.4 %
HCT: 31.6 % — ABNORMAL LOW (ref 38.5–50.0)
Hemoglobin: 10.2 g/dL — ABNORMAL LOW (ref 13.2–17.1)
Lymphs Abs: 2717 cells/uL (ref 850–3900)
MCH: 33.4 pg — ABNORMAL HIGH (ref 27.0–33.0)
MCHC: 32.3 g/dL (ref 32.0–36.0)
MCV: 103.6 fL — ABNORMAL HIGH (ref 80.0–100.0)
MPV: 11.8 fL (ref 7.5–12.5)
Monocytes Relative: 7.1 %
Neutro Abs: 5957 cells/uL (ref 1500–7800)
Neutrophils Relative %: 62.7 %
Platelets: 127 10*3/uL — ABNORMAL LOW (ref 140–400)
RBC: 3.05 10*6/uL — ABNORMAL LOW (ref 4.20–5.80)
RDW: 12.6 % (ref 11.0–15.0)
Total Lymphocyte: 28.6 %
WBC: 9.5 10*3/uL (ref 3.8–10.8)

## 2021-04-11 LAB — VITAMIN D 1,25 DIHYDROXY
Vitamin D 1, 25 (OH)2 Total: 46 pg/mL (ref 18–72)
Vitamin D2 1, 25 (OH)2: <8 pg/mL
Vitamin D3 1, 25 (OH)2: 46 pg/mL

## 2021-04-11 LAB — VITAMIN B12: Vitamin B-12: 607 pg/mL (ref 200–1100)

## 2021-04-11 LAB — TSH: TSH: 3.1 mIU/L (ref 0.40–4.50)

## 2021-04-12 ENCOUNTER — Encounter: Payer: Self-pay | Admitting: Family

## 2021-04-12 ENCOUNTER — Ambulatory Visit (HOSPITAL_COMMUNITY)
Admission: RE | Admit: 2021-04-12 | Discharge: 2021-04-12 | Disposition: A | Discharge status: Home / Self Care | Payer: Medicare Other | Source: Ambulatory Visit | Attending: Internal Medicine | Admitting: Internal Medicine

## 2021-04-12 ENCOUNTER — Other Ambulatory Visit: Payer: Self-pay

## 2021-04-12 ENCOUNTER — Ambulatory Visit (INDEPENDENT_AMBULATORY_CARE_PROVIDER_SITE_OTHER): Payer: Medicare Other | Admitting: Family

## 2021-04-12 VITALS — BP 140/70 | HR 83 | Temp 97.2°F | Resp 16 | Ht 76.0 in | Wt 176.0 lb

## 2021-04-12 DIAGNOSIS — N1832 Chronic kidney disease, stage 3b: Secondary | ICD-10-CM

## 2021-04-12 DIAGNOSIS — I5042 Chronic combined systolic (congestive) and diastolic (congestive) heart failure: Secondary | ICD-10-CM | POA: Diagnosis not present

## 2021-04-12 DIAGNOSIS — I10 Essential (primary) hypertension: Secondary | ICD-10-CM | POA: Diagnosis not present

## 2021-04-12 DIAGNOSIS — I480 Paroxysmal atrial fibrillation: Secondary | ICD-10-CM

## 2021-04-12 DIAGNOSIS — I5022 Chronic systolic (congestive) heart failure: Secondary | ICD-10-CM | POA: Insufficient documentation

## 2021-04-12 DIAGNOSIS — E1122 Type 2 diabetes mellitus with diabetic chronic kidney disease: Secondary | ICD-10-CM

## 2021-04-12 DIAGNOSIS — K219 Gastro-esophageal reflux disease without esophagitis: Secondary | ICD-10-CM

## 2021-04-12 DIAGNOSIS — F319 Bipolar disorder, unspecified: Secondary | ICD-10-CM

## 2021-04-12 DIAGNOSIS — D539 Nutritional anemia, unspecified: Secondary | ICD-10-CM

## 2021-04-12 LAB — BASIC METABOLIC PANEL
Anion gap: 8 (ref 5–15)
BUN: 29 mg/dL — ABNORMAL HIGH (ref 8–23)
CO2: 28 mmol/L (ref 22–32)
Calcium: 8.6 mg/dL — ABNORMAL LOW (ref 8.9–10.3)
Chloride: 106 mmol/L (ref 98–111)
Creatinine, Ser: 2.15 mg/dL — ABNORMAL HIGH (ref 0.61–1.24)
GFR, Estimated: 31 mL/min — ABNORMAL LOW (ref >60)
Glucose, Bld: 96 mg/dL (ref 70–99)
Potassium: 4.5 mmol/L (ref 3.5–5.1)
Sodium: 142 mmol/L (ref 135–145)

## 2021-04-12 NOTE — Progress Notes (Signed)
Provider: Marlowe Sax FNP-C   Yeva Bissette, [name], NP  Patient Care Team: Ethan Kasperski, [name], NP as PCP - General (Family Medicine) Martinique, Peter M, MD as PCP - Cardiology (Cardiology) Larey Dresser, MD as PCP - Advanced Heart Failure (Cardiology) Elmarie Shiley, MD (Nephrology) Milus Banister, MD (Gastroenterology) Jorge Ny, LCSW as Social Worker (Licensed Clinical Social Worker) Tobi Bastos, RN as [place] Management  Extended Emergency Contact Information Primary Emergency Contact: Lavelle Mobile Phone: [phone] Relation: Other Secondary Emergency Contact: [facility] Phone: [phone] Relation: None  Code Status:  Full code  Goals of care: Advanced Directive information Advanced Directives 04/12/2021  Does Patient Have a Medical Advance Directive? Yes  Type of Paramedic of [place];Living will  Does patient want to make changes to medical advance directive? No - Patient declined  Copy of [place] in Chart? Yes - validated most recent copy scanned in chart (See row information)  Would patient like information on creating a medical advance directive? -  Pre-existing out of facility DNR order (yellow form or pink MOST form) -     Chief Complaint  Patient presents with   Medical Management of Chronic Issues    5 month follow up & Lab report Discussion.   Health Maintenance    Discuss the need for Urine Microalbumin, and Foot exam.    HPI:  Pt is a 76 y.o. male seen today for 6 months for medical management of chronic diseases.  He is here with care giver. His recent lab work reviewed and discussed during visit. Labs unremarkable except magnesium level was low 1.2 not sure if taking magnesium oxide 400 mg tablet three times daily.will verify then notify provider if not taking to restart it.  CR still high but at baseline 2.24 Hemoglobin 10.2  He denies any recent fall  episode.     Past Medical History:  Diagnosis Date   Anemia    Anxiety    Benign paroxysmal positional vertigo    Bipolar I disorder, most recent episode (or current) unspecified    Celiac disease    CHF (congestive heart failure) (HCC)    Chronic kidney disease, stage III (moderate) (HCC)    Crohn's    Remicade q8 weeks   Depression    Essential and other specified forms of tremor    medication-induced Parkinson's, now resolved   Essential hypertension, benign    Gout 2018   Heart failure ([place])    Hypertrophy of prostate without urinary obstruction and other lower urinary tract symptoms (LUTS)    Impotence of organic origin    Insomnia with sleep apnea, unspecified    Iron deficiency anemia, unspecified    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   Neuralgia, neuritis, and radiculitis, unspecified    Other B-complex deficiencies    Other extrapyramidal disease and abnormal movement disorder    Postinflammatory pulmonary fibrosis ([place])    Tobacco use disorder    Vertigo 2018   Past Surgical History:  Procedure Laterality Date   CATARACT EXTRACTION, BILATERAL Bilateral 09/2018   CHOLECYSTECTOMY  07-12-2010   ICD IMPLANT N/A 01/02/2020   Procedure: ICD IMPLANT;  Surgeon: Evans Lance, MD;  Location: [place] [facility];  Service: Cardiovascular;  Laterality: N/A;   RIGHT HEART CATH N/A 10/12/2018   Procedure: RIGHT HEART CATH;  Surgeon: Jolaine Artist, MD;  Location: [place] [facility];  Service: Cardiovascular;  Laterality: N/A;   RIGHT/LEFT HEART CATH AND  CORONARY ANGIOGRAPHY N/A 04/26/2019   Procedure: RIGHT/LEFT HEART CATH AND CORONARY ANGIOGRAPHY;  Surgeon: Larey Dresser, MD;  Location: [place] [facility];  Service: Cardiovascular;  Laterality: N/A;   SMALL INTESTINE SURGERY     x 2    Allergies  Allergen Reactions   Azathioprine Other (See Comments)    REACTION: affected WBC "Almost died"   Ciprofloxacin Other (See Comments)    Reaction not recalled   Levaquin [Levofloxacin  In D5w] Other (See Comments)    Reaction not rec   Plendil [Felodipine] Other (See Comments)    Reaction not not recalled    Allergies as of 04/12/2021       Reactions   Azathioprine Other (See Comments)   REACTION: affected WBC "Almost died"   Ciprofloxacin Other (See Comments)   Reaction not recalled   Levaquin [levofloxacin In D5w] Other (See Comments)   Reaction not rec   Plendil [felodipine] Other (See Comments)   Reaction not not recalled        Medication List        Accurate as of April 12, 2021  2:39 PM. If you have any questions, ask your nurse or doctor.          acetaminophen 325 MG tablet Commonly known as: TYLENOL Take 650 mg by mouth every 6 (six) hours as needed.   allopurinol 100 MG tablet Commonly known as: ZYLOPRIM TAKE 2 TABLETS(200 MG) BY MOUTH DAILY   apixaban 5 MG Tabs tablet Commonly known as: ELIQUIS Take 1 tablet (5 mg total) by mouth 2 (two) times daily.   budesonide 3 MG 24 hr capsule Commonly known as: ENTOCORT EC Take 3 capsules (9 mg total) by mouth daily.   dapagliflozin propanediol 10 MG Tabs tablet Commonly known as: Farxiga Take 1 tablet (10 mg total) by mouth daily before breakfast.   divalproex 500 MG 24 hr tablet Commonly known as: DEPAKOTE ER TAKE 3 TABLETS(1500 MG) BY MOUTH AT BEDTIME   epoetin alfa 10000 UNIT/ML injection Commonly known as: EPOGEN 10,000 Units every 30 (thirty) days.   ferrous sulfate 325 (65 FE) MG tablet Take 1 tablet (325 mg total) by mouth daily.   furosemide 20 MG tablet Commonly known as: LASIX Take 1 tablet (20 mg total) by mouth daily.   inFLIXimab 100 MG injection Commonly known as: Remicade Infuse Remicade IV schedule 1 18m/kg every 8 weeks Premedicate with Tylenol 500-6544mby mouth and Benadryl 25-5068my mouth prior to infusion. Last PPD was on 12/2009. What changed:  how much to take how to take this when to take this   isosorbide mononitrate 60 MG 24 hr  tablet Commonly known as: IMDUR Take 1 tablet (60 mg total) by mouth daily.   ivabradine 5 MG Tabs tablet Commonly known as: [place] TAKE 1 TABLET(5 MG) BY MOUTH TWICE DAILY WITH A MEAL   loperamide 2 MG capsule Commonly known as: IMODIUM Take 1 capsule (2 mg total) by mouth 3 (three) times daily as needed for diarrhea or loose stools.   magnesium oxide 400 MG tablet Commonly known as: MAG-OX Take 1 tablet (400 mg total) by mouth in the morning, at noon, and at bedtime.   metoprolol succinate 25 MG 24 hr tablet Commonly known as: TOPROL-XL Take 1.5 tablets (37.5 mg total) by mouth in the morning and at bedtime.   mirtazapine 7.5 MG tablet Commonly known as: REMERON TAKE 1 TABLET(7.5 MG) BY MOUTH AT BEDTIME   multivitamin with minerals Tabs tablet Take  1 tablet by mouth daily.   omeprazole 20 MG tablet Commonly known as: PriLOSEC OTC Take 1 tablet (20 mg total) by mouth daily. For GERD   QUEtiapine 100 MG tablet Commonly known as: SEROQUEL Take 1.5 tablets (150 mg total) by mouth at bedtime.   solifenacin 5 MG tablet Commonly known as: VESICARE TAKE 1 TABLET(5 MG) BY MOUTH DAILY   spironolactone 25 MG tablet Commonly known as: Aldactone Take 0.5 tablets (12.5 mg total) by mouth daily.   tamsulosin 0.4 MG Caps capsule Commonly known as: FLOMAX Take 1 capsule (0.4 mg total) by mouth daily.   vitamin B-12 500 MCG tablet Commonly known as: CYANOCOBALAMIN Take 2 tablets (1,000 mcg total) by mouth daily.   Vitamin D 125 MCG (5000 UT) Caps Take 5,000 Units by mouth daily.        Review of Systems  Constitutional:  Negative for appetite change, chills, fatigue, fever and unexpected weight change.  HENT:  Negative for congestion, dental problem, ear discharge, ear pain, facial swelling, hearing loss, nosebleeds, postnasal drip, rhinorrhea, sinus pressure, sinus pain, sneezing, sore throat, tinnitus and trouble swallowing.   Eyes:  Negative for pain, discharge,  redness, itching and visual disturbance.  Respiratory:  Negative for cough, chest tightness, shortness of breath and wheezing.   Cardiovascular:  Negative for chest pain, palpitations and leg swelling.  Gastrointestinal:  Negative for abdominal distention, abdominal pain, blood in stool, constipation, diarrhea, nausea and vomiting.  Endocrine: Negative for cold intolerance, heat intolerance, polydipsia, polyphagia and polyuria.  Genitourinary:  Negative for difficulty urinating, dysuria, flank pain, frequency and urgency.  Musculoskeletal:  Positive for gait problem. Negative for arthralgias, back pain, joint swelling, myalgias, neck pain and neck stiffness.  Skin:  Negative for color change, pallor, rash and wound.  Neurological:  Negative for dizziness, syncope, speech difficulty, weakness, light-headedness, numbness and headaches.  Hematological:  Does not bruise/bleed easily.  Psychiatric/Behavioral:  Negative for agitation, behavioral problems, confusion, hallucinations, self-injury, sleep disturbance and suicidal ideas. The patient is not nervous/anxious.    Immunization History  Administered Date(s) Administered   Fluad Quad(high Dose 65+) 11/15/2018, 11/17/2019, 11/19/2020   H1N1 02/07/2008   Influenza, High Dose Seasonal PF 11/26/2016, 12/15/2017   Influenza, Seasonal, Injecte, Preservative Fre 12/12/2008, 11/07/2009, 11/04/2010   Influenza,inj,Quad PF,6+ Mos 11/13/2014, 01/03/2016   Influenza-Unspecified 01/11/1997, 12/15/1997, 03/02/2000, 01/02/2003, 12/18/2003, 12/30/2004, 12/10/2005, 12/25/2005, 01/11/2007, 02/07/2008, 12/08/2011, 10/25/2012, 12/16/2012, 12/09/2013, 11/28/2014, 12/31/2015, 11/24/2016, 10/29/2017, 11/24/2017, 12/15/2017, 11/25/2018   Moderna Covid-19 Vaccine Bivalent Booster 65yr & up 11/19/2020   Moderna Sars-Covid-2 Vaccination 04/20/2019, 05/18/2019, 10/25/2019   PPD Test 12/30/2010, 01/05/2012, 01/07/2013, 01/13/2014   Pneumococcal Conjugate-13 01/03/2014,  08/10/2014   Pneumococcal Polysaccharide-23 03/02/2000, 03/05/2004, 03/27/2005, 01/09/2015, 01/03/2016, 05/06/2018   Pneumococcal-Unspecified 12/25/2005   Tdap 05/12/2011, 05/26/2011   Zoster Recombinat (Shingrix) 05/13/2018, 04/01/2019   Pertinent  Health Maintenance Due  Topic Date Due   URINE MICROALBUMIN  Never done   FOOT EXAM  08/09/2020   OPHTHALMOLOGY EXAM  06/28/2021   HEMOGLOBIN A1C  08/26/2021   INFLUENZA VACCINE  Completed   COLONOSCOPY (Pts 45-490yrInsurance coverage will need to be confirmed)  Discontinued   Fall Risk 03/01/2021 03/01/2021 03/26/2021 03/26/2021 04/12/2021  Falls in the past year? - - - 0 0  Was there an injury with Fall? - - - - 0  Fall Risk Category Calculator - - - - 0  Fall Risk Category - - - - Low  Patient Fall Risk Level High fall risk High fall risk Moderate fall risk  Moderate fall risk Moderate fall risk  Patient at Risk for Falls Due to - - - Impaired balance/gait Impaired balance/gait  Patient at Risk for Falls Due to - - - - -  Fall risk Follow up - - - Education provided;Falls prevention discussed Falls evaluation completed;Education provided;Falls prevention discussed   Functional Status Survey:    Vitals:   04/12/21 1434  BP: 140/70  Pulse: 83  Resp: 16  Temp: (!) 97.2 F (36.2 C)  SpO2: 93%  Weight: 176 lb (79.8 kg)  Height: _0  (1.93 m)   Body mass index is 21.42 kg/m. Physical Exam Vitals reviewed.  Constitutional:      General: He is not in acute distress.    Appearance: Normal appearance. He is normal weight. He is not ill-appearing or diaphoretic.  HENT:     Head: Normocephalic.     Right Ear: Tympanic membrane, ear canal and external ear normal. There is no impacted cerumen.     Left Ear: Tympanic membrane, ear canal and external ear normal. There is no impacted cerumen.     Nose: Nose normal. No congestion or rhinorrhea.     Mouth/Throat:     Mouth: Mucous membranes are moist.     Pharynx: Oropharynx is clear. No  oropharyngeal exudate or posterior oropharyngeal erythema.  Eyes:     General: No scleral icterus.       Right eye: No discharge.        Left eye: No discharge.     Extraocular Movements: Extraocular movements intact.     Conjunctiva/sclera: Conjunctivae normal.     Pupils: Pupils are equal, round, and reactive to light.  Neck:     Vascular: No carotid bruit.  Cardiovascular:     Rate and Rhythm: Normal rate and regular rhythm.     Pulses: Normal pulses.     Heart sounds: Normal heart sounds. No murmur heard.   No friction rub. No gallop.  Pulmonary:     Effort: Pulmonary effort is normal. No respiratory distress.     Breath sounds: Normal breath sounds. No wheezing, rhonchi or rales.  Chest:     Chest wall: No tenderness.  Abdominal:     General: Bowel sounds are normal. There is no distension.     Palpations: Abdomen is soft. There is no mass.     Tenderness: There is no abdominal tenderness. There is no right CVA tenderness, left CVA tenderness, guarding or rebound.  Musculoskeletal:        General: No swelling or tenderness. Normal range of motion.     Cervical back: Normal range of motion. No rigidity or tenderness.     Right lower leg: Edema present.     Left lower leg: Edema present.  Lymphadenopathy:     Cervical: No cervical adenopathy.  Skin:    General: Skin is warm and dry.     Coloration: Skin is not pale.     Findings: No bruising, erythema, lesion or rash.  Neurological:     Mental Status: He is alert and oriented to person, place, and time.     Cranial Nerves: No cranial nerve deficit.     Sensory: No sensory deficit.     Motor: No weakness.     Coordination: Coordination normal.     Gait: Gait abnormal.  Psychiatric:        Mood and Affect: Mood normal.        Speech: Speech normal.  Behavior: Behavior normal.        Thought Content: Thought content normal.        Judgment: Judgment normal.    Labs reviewed: Recent Labs    10/10/20 0154  10/11/20 0342 10/12/20 0650 02/27/21 0310 02/27/21 1353 02/28/21 0255 02/28/21 1432 03/27/21 1334 04/08/21 0902 04/12/21 0900  NA 142 144   < > 134*  --  138   < > 138 144 142  K 3.9 3.9   < > 5.7*   < > 5.5*   < > 3.7 4.2 4.5  CL 114* 109   < > 102  --  111   < > 104 107 106  CO2 22 25   < > 27  --  23   < > _0 GLUCOSE 119* 101*   < > 113*  --  97   < > 102* 85 96  BUN 22 19   < > 36*  --  35*   < > 35* 43* 29*  CREATININE 1.81* 1.92*   < > 3.01*  --  2.81*   < > 2.44* 2.24* 2.15*  CALCIUM 8.5* 8.9   < > 8.9  --  8.6*   < > 8.5* 8.6 8.6*  MG 1.4* 2.0   < > 1.8  --  1.7  --   --  1.2*  --   PHOS 1.6* 3.8  --   --   --   --   --   --   --   --    < > = values in this interval not displayed.   Recent Labs    10/10/20 0154 10/31/20 1945 11/01/20 0245 11/06/20 1220 04/08/21 0902  AST 11* _1 ALT _2 ALKPHOS 49 81 80  --   --   BILITOT 0.7 0.9 0.8 0.3 0.4  PROT 5.8* 6.0* 6.7 6.7 6.4  ALBUMIN 2.2* 2.5* 2.6*  --   --    Recent Labs    11/06/20 1220 11/14/20 1900 02/27/21 0310 02/28/21 0255 03/08/21 0000 04/08/21 0902  WBC 10.8   < > 8.6 9.7 9.3 9.5  NEUTROABS 7,722  --  5.9  --   --  5,957  HGB 9.2*   < > 10.5* 9.8* 10.2* 10.2*  HCT 28.4*   < > 33.0* 31.2* 33* 31.6*  MCV 103.3*   < > 107.8* 109.9*  --  103.6*  PLT 147   < > 145* 155 170 127*   < > = values in this interval not displayed.   Lab Results  Component Value Date   TSH 3.10 04/08/2021   Lab Results  Component Value Date   HGBA1C 4.9 02/26/2021   Lab Results  Component Value Date   CHOL 147 04/08/2021   HDL 58 04/08/2021   LDLCALC 71 04/08/2021   TRIG 97 04/08/2021   CHOLHDL 2.5 04/08/2021    Significant Diagnostic Results in last 30 days:  ECHOCARDIOGRAM COMPLETE  Result Date: 03/27/2021    ECHOCARDIOGRAM REPORT   Patient Name:   JESSICA SEIDMAN Romito Date of Exam: 03/27/2021 Medical Rec #:  [id number]    Height:       76.0 in Accession #:    [id number]   Weight:       168.0 lb  Date of Birth:  [birth year]    BSA:          2.057 m Patient Age:  75 years     BP:           130/80 mmHg Patient Gender: M            HR:           84 bpm. Exam Location:  Outpatient Procedure: 2D Echo, 3D Echo, Color Doppler and Cardiac Doppler Indications:    Congestive Heart Failure I50.9  History:        Patient has prior history of Echocardiogram examinations, most                 recent 11/24/2019. CHF, Defibrillator; Risk Factors:Hypertension                 and Current Smoker.  Sonographer:    Mikki Santee RDCS Referring Phys: [place]  1. EF unchanged from prior. LVEF 20-25% with global hypokinesis. Left ventricular ejection fraction, by estimation, is 20 to 25%. Left ventricular ejection fraction by 3D volume is 20 %. The left ventricle has severely decreased function. The left ventricle demonstrates global hypokinesis. The left ventricular internal cavity size was mildly dilated. Indeterminate diastolic filling due to E-A fusion.  2. Right ventricular systolic function is normal. The right ventricular size is normal. Tricuspid regurgitation signal is inadequate for assessing PA pressure.  3. Left atrial size was mildly dilated.  4. The mitral valve is grossly normal. Trivial mitral valve regurgitation. No evidence of mitral stenosis.  5. The aortic valve is tricuspid. Aortic valve regurgitation is mild. No aortic stenosis is present.  6. Aortic dilatation noted. There is mild dilatation of the aortic root, measuring 41 mm.  7. The inferior vena cava is normal in size with greater than 50% respiratory variability, suggesting right atrial pressure of 3 mmHg. Comparison(s): No significant change from prior study. FINDINGS  Left Ventricle: EF unchanged from prior. LVEF 20-25% with global hypokinesis. Left ventricular ejection fraction, by estimation, is 20 to 25%. Left ventricular ejection fraction by 3D volume is 20 %. The left ventricle has severely decreased function. The left  ventricle demonstrates global hypokinesis. The left ventricular internal cavity size was mildly dilated. There is no left ventricular hypertrophy. Indeterminate diastolic filling due to E-A fusion. Right Ventricle: The right ventricular size is normal. No increase in right ventricular wall thickness. Right ventricular systolic function is normal. Tricuspid regurgitation signal is inadequate for assessing PA pressure. Left Atrium: Left atrial size was mildly dilated. Right Atrium: Right atrial size was normal in size. Pericardium: There is no evidence of pericardial effusion. Mitral Valve: The mitral valve is grossly normal. Trivial mitral valve regurgitation. No evidence of mitral valve stenosis. Tricuspid Valve: The tricuspid valve is grossly normal. Tricuspid valve regurgitation is trivial. No evidence of tricuspid stenosis. Aortic Valve: The aortic valve is tricuspid. Aortic valve regurgitation is mild. No aortic stenosis is present. Pulmonic Valve: The pulmonic valve was grossly normal. Pulmonic valve regurgitation is not visualized. No evidence of pulmonic stenosis. Aorta: Aortic dilatation noted. There is mild dilatation of the aortic root, measuring 41 mm. Venous: The inferior vena cava is normal in size with greater than 50% respiratory variability, suggesting right atrial pressure of 3 mmHg. IAS/Shunts: The atrial septum is grossly normal. Additional Comments: A device lead is visualized in the right atrium and right ventricle.  LEFT VENTRICLE PLAX 2D LVIDd:         5.60 cm         Diastology LVIDs:  5.10 cm         LV e' medial:  3.68 cm/s LV PW:         1.00 cm         LV e' lateral: 3.30 cm/s LV IVS:        1.00 cm LVOT diam:     2.60 cm LV SV:         66              3D Volume EF LV SV Index:   32              LV 3D EF:    Left LVOT Area:     5.31 cm                     ventricul                                             ar                                             ejection                                              fraction                                             by 3D                                             volume is                                             20 %.                                 3D Volume EF:                                3D EF:        20 %                                LV EDV:       163 ml                                LV ESV:       130 ml  LV SV:        32 ml RIGHT VENTRICLE RV S prime:     10.50 cm/s TAPSE (M-mode): 1.3 cm LEFT ATRIUM             Index        RIGHT ATRIUM           Index LA diam:        3.40 cm 1.65 cm/m   RA Area:     11.40 cm LA Vol (A2C):   79.7 ml 38.75 ml/m  RA Volume:   21.10 ml  10.26 ml/m LA Vol (A4C):   50.4 ml 24.50 ml/m LA Biplane Vol: 63.3 ml 30.77 ml/m  AORTIC VALVE LVOT Vmax:   66.00 cm/s LVOT Vmean:  44.100 cm/s LVOT VTI:    0.124 m  AORTA Ao Root diam: 4.10 cm  SHUNTS Systemic VTI:  0.12 m Systemic Diam: 2.60 cm Eleonore Chiquito MD Electronically signed by Eleonore Chiquito MD Signature Date/Time: 03/27/2021/3:20:55 PM    Final    CUP PACEART REMOTE DEVICE CHECK  Result Date: 04/02/2021 Scheduled remote reviewed. Normal device function.  [place]- Eliquis Next remote 91 days- JJB   Assessment/Plan  1. Essential hypertension, benign B/p stable  Continue on metoprolol ,imdur,lasix - CBC with Differential/Platelet; Future - CMP with eGFR([facility]); Future - Lipid panel; Future  2. Chronic combined systolic and diastolic congestive heart failure (HCC) No signs of fluid overload  - check weight daily and notify provider for any abrupt weight gain or edema Continue on current medication  - CBC with Differential/Platelet; Future - CMP with eGFR([facility]); Future  3. Type 2 diabetes mellitus with stage 3b chronic kidney disease, without long-term current use of insulin ([place]) Lab Results  Component Value Date   HGBA1C 4.9 02/26/2021  Continue on farxiga  - Lipid panel; Future - TSH; Future -  Hemoglobin A1c; Future  4. Macrocytic anemia Hgb stable  Continue on ferrous sulfate  - CBC with Differential/Platelet; Future  5. Paroxysmal atrial fibrillation (HCC) HR controlled  Apixaban for coagulation  Continue on metoprolol  - CBC with Differential/Platelet; Future  6. Gastroesophageal reflux disease without esophagitis Symptoms controlled  Continue on Omeprazole  - CBC with Differential/Platelet; Future - Magnesium; Future  7. Bipolar 1 disorder (HCC) Continue on Seroquel   8. Hypomagnesemia Recent level 1.2  Not sure if he has been taking his magnesium oxide supplement three times daily. He will verify medication and  - Magnesium; Future  Family/ staff Communication: Reviewed plan of care with patient  Labs/tests ordered:  - CBC with Differential/Platelet - CMP with eGFR([facility]) - TSH - Hgb A1C - Lipid panel - Magnesium; Future  Next Appointment : 6 months for medical management of chronic issues.Fasting Labs prior to visit.    Sandrea Hughs, NP

## 2021-04-12 NOTE — Patient Instructions (Addendum)
-   check weight daily and notify provider for any abrupt weight gain or edema  - Please verify if you are taking  magnesium oxide then notify provider

## 2021-04-17 ENCOUNTER — Other Ambulatory Visit (HOSPITAL_COMMUNITY): Payer: Self-pay

## 2021-04-17 ENCOUNTER — Encounter: Payer: Self-pay | Admitting: *Deleted

## 2021-04-17 ENCOUNTER — Other Ambulatory Visit: Payer: Self-pay | Admitting: Adult Health

## 2021-04-17 ENCOUNTER — Other Ambulatory Visit: Payer: Self-pay | Admitting: *Deleted

## 2021-04-17 NOTE — Patient Outreach (Signed)
[name]) Care Management Telephonic RN Care Manager Note   04/17/2021 Name:  Brent [name] MRN:  [id number] DOB:  08/06/1945  Summary: Pt continue to do well with no acute events or issues over the last month. Remains in the GREEN zone and aware of what to do if acute symptoms should occur.  Subjective: Brent [name] is an 76 y.o. year old male who is a primary patient of Brent [name], Dinah C, NP. The care management team was consulted for assistance with care management and/or care coordination needs.    Telephonic RN Care Manager completed Telephone Visit today.  Objective:   Medications Reviewed Today     Reviewed by Brent [name], CMA (Certified Medical Assistant) on 04/12/21 at 1434  Med List Status: <None>   Medication Order Taking? Sig Documenting Provider Last Dose Status Informant  acetaminophen (TYLENOL) 325 MG tablet [id number] Yes Take 650 mg by mouth every 6 (six) hours as needed. [provider] Taking Active   allopurinol (ZYLOPRIM) 100 MG tablet [id number] Yes TAKE 2 TABLETS(200 MG) BY MOUTH DAILY Medina-Vargas, Monina C, NP Taking Active   apixaban (ELIQUIS) 5 MG TABS tablet [id number]  Take 1 tablet (5 mg total) by mouth 2 (two) times daily. Medina-Vargas, Monina C, NP  Expired 04/11/21 2359   budesonide (ENTOCORT EC) 3 MG 24 hr capsule [id number] Yes Take 3 capsules (9 mg total) by mouth daily. Medina-Vargas, Monina C, NP Taking Active   Cholecalciferol (VITAMIN D) 125 MCG (5000 UT) CAPS [id number] Yes Take 5,000 Units by mouth daily. Medina-Vargas, Monina C, NP Taking Active Self, Care Giver  dapagliflozin propanediol (FARXIGA) 10 MG TABS tablet [id number] Yes Take 1 tablet (10 mg total) by mouth daily before breakfast. Brent Dresser, MD Taking Active   divalproex (DEPAKOTE ER) 500 MG 24 hr tablet [id number] Yes TAKE 3 TABLETS(1500 MG) BY MOUTH AT BEDTIME Medina-Vargas, Monina C, NP Taking Active   epoetin alfa (EPOGEN) 10000 UNIT/ML injection  [id number] Yes 10,000 Units every 30 (thirty) days. [provider] Taking Active Self, Care Giver  ferrous sulfate 325 (65 FE) MG tablet [id number] Yes Take 1 tablet (325 mg total) by mouth daily. Medina-Vargas, Monina C, NP Taking Active   furosemide (LASIX) 20 MG tablet [id number] Yes Take 1 tablet (20 mg total) by mouth daily. Brent Dresser, MD Taking Active   inFLIXimab (REMICADE) 100 MG injection [id number] Yes Infuse Remicade IV schedule 1 75m/kg every 8 weeks Premedicate with Tylenol 500-6522mby mouth and Benadryl 25-507my mouth prior to infusion. Last PPD was on 12/2009.   Patient taking differently: Inject 1.5 mg/kg into the vein every 2 (two) months. Infuse Remicade IV schedule 1 5mg83m every 8 weeks Premedicate with Tylenol 500-650mg47mmouth and Benadryl 25-50mg 30mouth prior to infusion. Last PPD was on 12/2009.   Brent Banisteraking Active Self, Care Giver           Med Note (Brent GaribaldiINE [name]   Tue Nov 06, 2020 11:21 AM)    isosorbide mononitrate (IMDUR) 60 MG 24 hr tablet [id number] 1 tablet (60 mg total) by mouth daily. Brent Dresseraking Active   ivabradine ([facility] TABS tablet [id number] 1 TABLET(5 MG) BY MOUTH TWICE DAILY WITH A MEAL Brent Dresseraking Active   loperamide (IMODIUM) 2 MG capsule [id number] 1 capsule (2 mg total) by mouth 3 (three) times daily as needed for diarrhea or loose  stools. Medina-Vargas, Brent Lange, NP Taking Active Self, Care Giver           Med Note Brent [name], Brent [name]   Tue Jan 29, 2021  1:09 PM)    magnesium oxide (MAG-OX) 400 MG tablet [id number] Yes Take 1 tablet (400 mg total) by mouth in the morning, at noon, and at bedtime. Medina-Vargas, Monina C, NP Taking Active   metoprolol succinate (TOPROL-XL) 25 MG 24 hr tablet [id number] Yes Take 1.5 tablets (37.5 mg total) by mouth in the morning and at bedtime. Brent Dresser, MD Taking Active   mirtazapine (REMERON) 7.5 MG tablet  [id number] Yes TAKE 1 TABLET(7.5 MG) BY MOUTH AT BEDTIME Brent [name], Dinah C, NP Taking Active   Multiple Vitamin (MULTIVITAMIN WITH MINERALS) TABS tablet [id number] Yes Take 1 tablet by mouth daily. Brent Jansky, MD Taking Active Self, Care Giver  omeprazole (PRILOSEC OTC) 20 MG tablet [id number] Yes Take 1 tablet (20 mg total) by mouth daily. For GERD Medina-Vargas, Monina C, NP Taking Active   QUEtiapine (SEROQUEL) 100 MG tablet [id number] Yes Take 1.5 tablets (150 mg total) by mouth at bedtime. Medina-Vargas, Monina C, NP Taking Active   solifenacin (VESICARE) 5 MG tablet [id number] Yes TAKE 1 TABLET(5 MG) BY MOUTH DAILY Medina-Vargas, Monina C, NP Taking Active   spironolactone (ALDACTONE) 25 MG tablet [id number] Yes Take 0.5 tablets (12.5 mg total) by mouth daily. Brent Dresser, MD Taking Active   tamsulosin [facility]) 0.4 MG CAPS capsule [id number] Yes Take 1 capsule (0.4 mg total) by mouth daily. Medina-Vargas, Monina C, NP Taking Active   vitamin B-12 (CYANOCOBALAMIN) 500 MCG tablet [id number] Yes Take 2 tablets (1,000 mcg total) by mouth daily. Medina-Vargas, Brent Lange, NP Taking Active   Med List Note Brent [name], CPhT 11/01/20 1110): Brent [name] - in charge of home health medications [phone]  EMT (caregiver for his medications Nira Conn [phone])             SDOH:  (Social Determinants of Health) assessments and interventions performed:     Care Plan  Review of patient past medical history, allergies, medications, health status, including review of consultants reports, laboratory and other test data, was performed as part of comprehensive evaluation for care management services.   Care Plan : RN Care Manger Plan of Care  Updates made by Brent Bastos, RN since 04/17/2021 12:00 AM     Problem: Knowledge Deficit related to CHF management and care coordination needs   Priority: High     Long-Range Goal: Establish Plan of Care for managing Complex care coordination  needs in pt with CHF   Start Date: 03/26/2021  Expected End Date: 08/23/2021  This Visit's Progress: On track  Priority: High  Note:   Current Barriers:  Knowledge Deficits related to plan of care for management of CHF   RNCM Clinical Goal(s):  Patient will verbalize understanding of plan for management of CHF as evidenced by teach back method of education take all medications exactly as prescribed and will call provider for medication related questions as evidenced by self reporting via chart review demonstrate understanding of rationale for each prescribed medication as evidenced by self reporting education and   through collaboration with RN Care manager, provider, and care team.   Interventions: Inter-disciplinary care team collaboration (see longitudinal plan of care) Evaluation of current treatment plan related to  self management and patient's adherence to plan as established by provider   Heart Failure Interventions:  (Status:  Goal on track:  Yes.) Long Term Goal Basic overview and discussion of pathophysiology of Heart Failure reviewed Provided education on low sodium diet Reviewed Heart Failure Action Plan in depth and provided written copy Assessed need for readable accurate scales in home Provided education about placing scale on hard, flat surface Advised patient to weigh each morning after emptying bladder Discussed importance of daily weight and advised patient to weigh and record daily Reviewed role of diuretics in prevention of fluid overload and management of heart failure; Discussed the importance of keeping all appointments with provider Screening for signs and symptoms of depression related to chronic disease state   2/22-Update: Pt continue to do well with his daily weights reporting 173-174 lbs with no related symptoms of exacerbation over the last month. Pt continues services with para-medicine and [place] for ongoing services with no acute events.  Will continue to reiterate on the reviewed plan of care with all interventions and related goals. Al inquires and/or questions address with no acute needs at this time.  Patient Goals/Self-Care Activities: Take all medications as prescribed Attend all scheduled provider appointments Call pharmacy for medication refills 3-7 days in advance of running out of medications Attend church or other social activities Perform all self care activities independently  Perform IADL's (shopping, preparing meals, housekeeping, managing finances) independently Call provider office for new concerns or questions  call office if I gain more than 2 pounds in one day or 5 pounds in one week do ankle pumps when sitting keep legs up while sitting track weight in diary use salt in moderation watch for swelling in feet, ankles and legs every day weigh myself daily begin a heart failure diary bring diary to all appointments develop a rescue plan follow rescue plan if symptoms flare-up eat more whole grains, fruits and vegetables, lean meats and healthy fats know when to call the doctor:with symptoms in the YELLOW zone or when symptomatic track symptoms and what helps feel better or worse  Follow Up Plan:  Telephone follow up appointment with care management team member scheduled for:  March 2023 The patient has been provided with contact information for the care management team and has been advised to call with any health related questions or concerns.       Raina Mina, RN Care Management Coordinator [place] Office [phone]

## 2021-04-17 NOTE — Progress Notes (Signed)
Paramedicine Encounter    Patient ID: Brent [name], male    DOB: 1945/12/02, 76 y.o.   MRN: [id number]  Arrived for home visit for Brent [name] who reports feeling good today with no complaints of chest pain, dizziness. He says on exertion he does get short of breath. His lower leg edema is improved this week. He was successful at taking all medications over the last week. He reports he has not adhered to a low sodium diet over the last week, I reminded him to be sure to limit his sodium and fluids. He verbalized understanding.   Vitals and assessment obtained.   Meds were reviewed and confirmed. Pill box filled accordingly.   We reviewed appointments and confirmed same. Home visit complete. I will see Brent [name] in one week.   [facility] L[facility] Budenoside Eliquis   [facility]- Solfenacin Mag Oxide Seroquel English as a second language teacher      Patient Care Team: [name], [name], NP as PCP - General (Family Medicine) Martinique, Peter M, MD as PCP - Cardiology (Cardiology) Larey Dresser, MD as PCP - Advanced Heart Failure (Cardiology) Elmarie Shiley, MD (Nephrology) Milus Banister, MD (Gastroenterology) Jorge Ny, LCSW as Social Worker (Licensed Clinical Social Worker) Tobi Bastos, RN as [place] Management  Patient Active Problem List   Diagnosis Date Noted   Acute kidney injury superimposed on chronic kidney disease ([place]) 02/25/2021   Generalized weakness 02/25/2021   SIRS (systemic inflammatory response syndrome) ([place]) 10/31/2020   Neurocognitive deficits 10/17/2020   Hypokalemia 10/08/2020   Hypomagnesemia 10/08/2020   CKD (chronic kidney disease), stage III ([place]) 10/08/2020   COVID-19 virus infection 10/08/2020   Electrolyte abnormality 05/17/2020   ICD (implantable cardioverter-defibrillator) in place 71/07/2692   Chronic systolic heart failure ([place]) 01/02/2020   Decreased appetite 10/08/2019   Weakness 10/08/2019   Hyperkalemia 08/26/2019   Chronic  systolic CHF (congestive heart failure) ([place]) 08/26/2019   Type 2 diabetes mellitus with stage 3 chronic kidney disease ([place]) 08/26/2019   Chronic obstructive pulmonary disease ([place]) 06/20/2019   Ventricular tachycardia 06/20/2019   Acute on chronic systolic (congestive) heart failure ([place]) 85/46/2703   Metabolic acidosis 50/10/3816   Orthostasis 09/07/2018   AKI (acute kidney injury) ([place])    Immunosuppressed status ([place])    Macrocytic anemia    Chronic combined systolic and diastolic congestive heart failure ([place]) 06/03/2018   Candida esophagitis ([place]) 09/22/2017   History of smoking 30 or more pack years 01/05/2017   Bipolar 1 disorder ([place])    BPH (benign prostatic hyperplasia) 03/31/2013   Anemia in chronic kidney disease 03/31/2013   Essential hypertension, benign 03/31/2013   Acute renal failure superimposed on stage 3 chronic kidney disease ([place]) 06/04/2012   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[place]) 01/23/2010    Current Outpatient Medications:    acetaminophen (TYLENOL) 325 MG tablet, Take 650 mg by mouth every 6 (six) hours as needed., Disp: , Rfl:    allopurinol (ZYLOPRIM) 100 MG tablet, TAKE 2 TABLETS(200 MG) BY MOUTH DAILY, Disp: 60 tablet, Rfl: 0   apixaban (ELIQUIS) 5 MG TABS tablet, Take 1 tablet (5 mg total) by mouth 2 (two) times daily., Disp: 60 tablet, Rfl: 0   budesonide (ENTOCORT EC) 3 MG 24 hr capsule, Take 3 capsules (9 mg total) by mouth daily., Disp: 90 capsule, Rfl: 0   Cholecalciferol (VITAMIN D) 125 MCG (5000 UT) CAPS, Take 5,000 Units by mouth daily., Disp: 30 capsule, Rfl: 0   dapagliflozin propanediol (FARXIGA) 10 MG TABS tablet,  Take 1 tablet (10 mg total) by mouth daily before breakfast., Disp: 30 tablet, Rfl: 11   divalproex (DEPAKOTE ER) 500 MG 24 hr tablet, TAKE 3 TABLETS(1500 MG) BY MOUTH AT BEDTIME, Disp: 90 tablet, Rfl: 0   epoetin alfa (EPOGEN) 10000 UNIT/ML injection, 10,000 Units every 30 (thirty) days., Disp: , Rfl:    ferrous sulfate 325 (65 FE) MG  tablet, Take 1 tablet (325 mg total) by mouth daily., Disp: 30 tablet, Rfl: 0   furosemide (LASIX) 20 MG tablet, Take 1 tablet (20 mg total) by mouth daily., Disp: 90 tablet, Rfl: 3   inFLIXimab (REMICADE) 100 MG injection, Infuse Remicade IV schedule 1 55m/kg every 8 weeks Premedicate with Tylenol 500-655mby mouth and Benadryl 25-509my mouth prior to infusion. Last PPD was on 12/2009.  (Patient taking differently: Inject 1.5 mg/kg into the vein every 2 (two) months. Infuse Remicade IV schedule 1 5mg29m every 8 weeks Premedicate with Tylenol 500-650mg22mmouth and Benadryl 25-50mg 51mouth prior to infusion. Last PPD was on 12/2009.), Disp: 1 each, Rfl: 6   isosorbide mononitrate (IMDUR) 60 MG 24 hr tablet, Take 1 tablet (60 mg total) by mouth daily., Disp: 90 tablet, Rfl: 3   ivabradine (CORLANOR) 5 MG TABS tablet, TAKE 1 TABLET(5 MG) BY MOUTH TWICE DAILY WITH A MEAL, Disp: 180 tablet, Rfl: 3   loperamide (IMODIUM) 2 MG capsule, Take 1 capsule (2 mg total) by mouth 3 (three) times daily as needed for diarrhea or loose stools., Disp: 30 capsule, Rfl: 0   magnesium oxide (MAG-OX) 400 MG tablet, Take 1 tablet (400 mg total) by mouth in the morning, at noon, and at bedtime., Disp: 90 tablet, Rfl: 0   metoprolol succinate (TOPROL-XL) 25 MG 24 hr tablet, Take 1.5 tablets (37.5 mg total) by mouth in the morning and at bedtime., Disp: 90 tablet, Rfl: 6   mirtazapine (REMERON) 7.5 MG tablet, TAKE 1 TABLET(7.5 MG) BY MOUTH AT BEDTIME, Disp: 30 tablet, Rfl: 3   Multiple Vitamin (MULTIVITAMIN WITH MINERALS) TABS tablet, Take 1 tablet by mouth daily., Disp: , Rfl:    omeprazole (PRILOSEC OTC) 20 MG tablet, Take 1 tablet (20 mg total) by mouth daily. For GERD, Disp: 30 tablet, Rfl: 0   QUEtiapine (SEROQUEL) 100 MG tablet, Take 1.5 tablets (150 mg total) by mouth at bedtime., Disp: 45 tablet, Rfl: 5   solifenacin (VESICARE) 5 MG tablet, TAKE 1 TABLET(5 MG) BY MOUTH DAILY, Disp: 30 tablet, Rfl: 0   spironolactone  (ALDACTONE) 25 MG tablet, Take 0.5 tablets (12.5 mg total) by mouth daily., Disp: 15 tablet, Rfl: 6   tamsulosin (FLOMAX) 0.4 MG CAPS capsule, Take 1 capsule (0.4 mg total) by mouth daily., Disp: 30 capsule, Rfl: 0   vitamin B-12 (CYANOCOBALAMIN) 500 MCG tablet, Take 2 tablets (1,000 mcg total) by mouth daily., Disp: 60 tablet, Rfl: 0 Allergies  Allergen Reactions   Azathioprine Other (See Comments)    REACTION: affected WBC "Almost died"   Ciprofloxacin Other (See Comments)    Reaction not recalled   Levaquin [Levofloxacin In D5w] Other (See Comments)    Reaction not rec   Plendil [Felodipine] Other (See Comments)    Reaction not not recalled     Social History   Socioeconomic History   Marital status: Divorced    Spouse name: Not on file   Number of children: 1   Years of education: 12   Highest education level: Not on file  Occupational History   Occupation: retired  Occupation: Veteran  Tobacco Use   Smoking status: Former    Packs/day: 1.00    Years: 49.00    Pack years: 49.00    Types: Cigarettes    Start date: 04/11/1956    Quit date: 04/17/2014    Years since quitting: 7.0   Smokeless tobacco: Never  Vaping Use   Vaping Use: Never used  Substance and Sexual Activity   Alcohol use: No    Alcohol/week: 0.0 standard drinks   Drug use: No   Sexual activity: Never  Other Topics Concern   Not on file  Social History Narrative   Not on file   Social Determinants of Health   Financial Resource Strain: Not on file  Food Insecurity: No Food Insecurity   Worried About Charity fundraiser in the Last Year: Never true   Ran Out of Food in the Last Year: Never true  Transportation Needs: No Transportation Needs   Lack of Transportation (Medical): No   Lack of Transportation (Non-Medical): No  Physical Activity: Not on file  Stress: Not on file  Social Connections: Not on file  Intimate Partner Violence: Not on file    Physical Exam Vitals reviewed.   Constitutional:      Appearance: Normal appearance. He is normal weight.  HENT:     Head: Normocephalic.     Nose: Nose normal.     Mouth/Throat:     Mouth: Mucous membranes are moist.     Pharynx: Oropharynx is clear.  Eyes:     Conjunctiva/sclera: Conjunctivae normal.     Pupils: Pupils are equal, round, and reactive to light.  Cardiovascular:     Rate and Rhythm: Normal rate and regular rhythm.     Pulses: Normal pulses.     Heart sounds: Normal heart sounds.  Pulmonary:     Effort: Pulmonary effort is normal.     Breath sounds: Normal breath sounds.  Abdominal:     General: Abdomen is flat.     Palpations: Abdomen is soft.  Musculoskeletal:        General: No swelling. Normal range of motion.     Cervical back: Normal range of motion.     Right lower leg: No edema.     Left lower leg: No edema.  Skin:    General: Skin is warm and dry.     Capillary Refill: Capillary refill takes less than 2 seconds.  Neurological:     General: No focal deficit present.     Mental Status: He is alert. Mental status is at baseline.  Psychiatric:        Mood and Affect: Mood normal.        Future Appointments  Date Time Provider [place]  04/17/2021  1:00 PM Tobi Bastos, RN [facility] None  04/19/2021 10:00 AM [facility] RM 1 [facility] None  04/22/2021  7:20 AM [facility] DEVICE REMOTES [facility]  04/29/2021  2:00 PM [facility] LAB [facility]SC-[facility] None  05/08/2021  3:30 PM [facility] PA/NP [facility] None  07/02/2021  7:00 AM [facility] DEVICE REMOTES [facility]  10/01/2021  7:00 AM [facility] DEVICE REMOTES [facility]  10/07/2021  9:00 AM PSC-PSC LAB PSC-PSC None  10/10/2021 10:00 AM [name], Dinah C, NP PSC[facility] None  12/31/2021  7:00 AM [facility] DEVICE REMOTES [facility]  01/31/2022  3:45 PM [name], Dinah C, NP PSC-[facility] None  04/01/2022  7:00 AM [facility] DEVICE REMOTES [facility]  07/01/2022  7:00 AM [facility] DEVICE  REMOTES [facility]  09/30/2022  7:00 AM [facility] DEVICE REMOTES [facility]     ACTION: Home visit completed

## 2021-04-19 ENCOUNTER — Inpatient Hospital Stay (HOSPITAL_COMMUNITY): Admission: RE | Admit: 2021-04-19 | Payer: Medicare Other | Source: Ambulatory Visit

## 2021-04-19 ENCOUNTER — Telehealth: Payer: Self-pay

## 2021-04-19 NOTE — Telephone Encounter (Signed)
Called both [facility] and patient and informed them of the response for Brent [name],NP. Patient verbalized his understanding and agreed.

## 2021-04-19 NOTE — Telephone Encounter (Signed)
Kaitlyn with [facility] called stating weight change for patient. She stated that she weighed patient on yesterday and he weighed 167.6 lbs and when weighed today he weighed 173.4 lbs.   [place]

## 2021-04-19 NOTE — Telephone Encounter (Signed)
Advise patient to take extra dose of Furosemide 40 mg for 3 days then resume 20 mg tablet daily

## 2021-04-22 ENCOUNTER — Ambulatory Visit (INDEPENDENT_AMBULATORY_CARE_PROVIDER_SITE_OTHER): Payer: Medicare Other

## 2021-04-22 DIAGNOSIS — I5022 Chronic systolic (congestive) heart failure: Secondary | ICD-10-CM

## 2021-04-22 DIAGNOSIS — Z95810 Presence of automatic (implantable) cardiac defibrillator: Secondary | ICD-10-CM

## 2021-04-24 ENCOUNTER — Telehealth (HOSPITAL_COMMUNITY): Payer: Self-pay | Admitting: Licensed Clinical Social Worker

## 2021-04-24 ENCOUNTER — Other Ambulatory Visit (HOSPITAL_COMMUNITY): Payer: Self-pay

## 2021-04-24 NOTE — Progress Notes (Signed)
EPIC Encounter for ICM Monitoring  Patient Name: Brent [name] is a 76 y.o. male Date: 04/24/2021 Primary Care Physican: Sandrea Hughs, NP Primary Cardiologist: [name] Electrophysiologist: Lovena Le 04/02/2021 Weight: 170.2 lbs                                                            Heart Failure questions reviewed.  Pt reports improved swelling in feet but not completely resolved.    Optivol thoracic impedance suggesting fluid levels returned to normal.   Prescribed:  Furosemide 20 mg take 1 tablet (20 mg total) by mouth daily (changed to daily 2/10) Farxiga 10 mg take 1 tablet by mouth daily before breakfast. Spironolactone 25 mg take 0.5 tablet (12.5 mg total) daily   Labs: 04/12/2021 Creatinine 2.15, BUN 29, Potassium 4.5, Sodium 142 04/08/2021 Creatinine 2.24, BUN 43, Potassium 4.2, Sodium 144, GFR 30  03/27/2021 Creatinine 2.44, BUN 35, Potassium 3.7, Sodium 138,  03/08/2021 Creatinine 1.8,   BUN 27, Potassium 4.3, Sodium 144 03/01/2021 Creatinine 2.30, BUN 33, Potassium 4.6, Sodium 137 02/28/2021 Creatinine 2.81, BUN 35, Potassium 5.5, Sodium 138 02/27/2021 Creatinine 3.01, BUN 36, Potassium 5.7, Sodium 134 02/26/2021 Creatinine 3.34, BUN 33, Potassium 5.2, Sodium 136 02/25/2021 Creatinine 3.62, BUN 32, Potassium 6.1, Sodium 136 A complete set of results can be found in Results Review.   Recommendations:  No changes and encouraged to call if experiencing any fluid symptoms.   Follow-up plan: ICM clinic phone appointment on 05/20/2021.   91 day device clinic remote transmission 07/02/2021.     EP/Cardiology Office Visits: 05/08/2021 with [facility] NP/PA.     Copy of ICM check sent to Dr. Lovena Le.    3 month ICM trend: 04/22/2021.    12-14 Month ICM trend:     Rosalene Billings, RN 04/24/2021 4:27 PM

## 2021-04-24 NOTE — Progress Notes (Signed)
Paramedicine Encounter ? ? ? Patient ID: Brent [name], male    DOB: 1945-07-17, 77 y.o.   MRN: [id number] ? ?Arrived for Med Rec for [name] who was alert and oriented reporting no complaints today. He denied chest pain, dizziness or shortness of breath upon sitting but some upon exertion. Weight today is down 7lbs.  ? ?Medications reviewed and confirmed. Missing Farxiga and [place] waiting on same from [place].  ? ?Pill box filled for one week. I will obtain samples of Corlanor and Farxiga and deliver to patient and place in pill box.  ? ?Refills: ?Loperamide ?Eliquis  ? ?I will see Marvin in one week. ? ?ACTION: ?Home visit completed ? ? ? ? ? ? ?

## 2021-04-24 NOTE — Telephone Encounter (Signed)
HF Paramedicine Team Based Care Meeting ? ?HF MD- NA  ?HF NP - Amy Clegg NP-C  ? ?[facility] - Closed HF Paramedicine  ?Brent [name] ?Brent [name]  ?Brent [name] ? ?Hospital admit within the last 30 days for heart failure? no ? ?Medications concerns? Having trouble getting farxiga tand corlanor ? ?Eligible for discharge? Having continued fluid fluctuation and need for continued assistance with medication management ? ?Jorge Ny, LCSW ?Clinical Social Worker ?[facility] ?Desk#: [phone] ?Cell#: [phone] ? ? ?

## 2021-04-26 ENCOUNTER — Other Ambulatory Visit: Payer: Self-pay

## 2021-04-26 ENCOUNTER — Non-Acute Institutional Stay (HOSPITAL_COMMUNITY)
Admission: RE | Admit: 2021-04-26 | Discharge: 2021-04-26 | Disposition: A | Discharge status: Home / Self Care | Payer: Medicare Other | Source: Ambulatory Visit | Attending: Internal Medicine | Admitting: Internal Medicine

## 2021-04-26 DIAGNOSIS — D631 Anemia in chronic kidney disease: Secondary | ICD-10-CM | POA: Insufficient documentation

## 2021-04-26 LAB — IRON AND TIBC
Iron: 50 ug/dL (ref 45–182)
Saturation Ratios: 21 % (ref 17.9–39.5)
TIBC: 243 ug/dL — ABNORMAL LOW (ref 250–450)
UIBC: 193 ug/dL

## 2021-04-26 LAB — HEMOGLOBIN AND HEMATOCRIT, BLOOD
HCT: 34.5 % — ABNORMAL LOW (ref 39.0–52.0)
Hemoglobin: 10.8 g/dL — ABNORMAL LOW (ref 13.0–17.0)

## 2021-04-26 LAB — FERRITIN: Ferritin: 920 ng/mL — ABNORMAL HIGH (ref 24–336)

## 2021-04-26 MED ORDER — EPOETIN ALFA 10000 UNIT/ML IJ SOLN
10000.0000 [IU] | INTRAMUSCULAR | Status: DC
  Administered 2021-04-26: 10000 [IU] via SUBCUTANEOUS
  Filled 2021-04-26: qty 1

## 2021-04-26 NOTE — Progress Notes (Addendum)
PATIENT CARE CENTER NOTE ?  ?  ?Diagnosis: Anemia associated with Chronic Renal Failure, anemia associated with renal disease ?  ?  ?Provider: Elmarie Shiley MD ?  ?  ?Procedure: Epoetin Alfa (Procrit) injection ?  ?  ?Note: Patient received 10,000 unit Procrit sub-q injection in left arm. Tolerated well. Labs drawn pre-injection and Hemoglobin was 10.8. Patient's BP elevated but within order parameters at 158/93.  Per patient, he forgot to take BP medications this morning. Discharge instructions given. Patient's next appointment scheduled for 05/24/2021. Patient alert, oriented and ambulatory at discharge. ?

## 2021-04-29 ENCOUNTER — Other Ambulatory Visit: Payer: Medicare Other

## 2021-04-29 DIAGNOSIS — I5042 Chronic combined systolic (congestive) and diastolic (congestive) heart failure: Secondary | ICD-10-CM

## 2021-04-29 DIAGNOSIS — I480 Paroxysmal atrial fibrillation: Secondary | ICD-10-CM

## 2021-04-29 DIAGNOSIS — D539 Nutritional anemia, unspecified: Secondary | ICD-10-CM

## 2021-04-29 DIAGNOSIS — N1832 Chronic kidney disease, stage 3b: Secondary | ICD-10-CM

## 2021-04-29 DIAGNOSIS — K219 Gastro-esophageal reflux disease without esophagitis: Secondary | ICD-10-CM

## 2021-04-29 DIAGNOSIS — I10 Essential (primary) hypertension: Secondary | ICD-10-CM

## 2021-04-30 ENCOUNTER — Other Ambulatory Visit: Payer: Self-pay

## 2021-04-30 ENCOUNTER — Encounter: Payer: Self-pay | Admitting: Family

## 2021-04-30 ENCOUNTER — Ambulatory Visit (INDEPENDENT_AMBULATORY_CARE_PROVIDER_SITE_OTHER): Payer: Medicare Other | Admitting: Family

## 2021-04-30 VITALS — BP 130/70 | HR 110 | Temp 97.7°F | Resp 16 | Ht 76.0 in | Wt 173.2 lb

## 2021-04-30 DIAGNOSIS — I5042 Chronic combined systolic (congestive) and diastolic (congestive) heart failure: Secondary | ICD-10-CM | POA: Diagnosis not present

## 2021-04-30 DIAGNOSIS — R600 Localized edema: Secondary | ICD-10-CM | POA: Diagnosis not present

## 2021-04-30 MED ORDER — FUROSEMIDE 20 MG PO TABS: ORAL_TABLET | ORAL | 5 refills | Status: DC

## 2021-04-30 NOTE — Progress Notes (Signed)
Provider: Marlowe Sax FNP-C  Lenord Fralix, [name], NP  Patient Care Team: Kaitlan Bin, [name], NP as PCP - General (Family Medicine) Martinique, Peter M, MD as PCP - Cardiology (Cardiology) Larey Dresser, MD as PCP - Advanced Heart Failure (Cardiology) Elmarie Shiley, MD (Nephrology) Milus Banister, MD (Gastroenterology) Jorge Ny, LCSW as Social Worker (Licensed Clinical Social Worker) Tobi Bastos, RN as [place] Management  Extended Emergency Contact Information Primary Emergency Contact: [place] [facility] Phone: [phone] Relation: Other Secondary Emergency Contact: [facility] Phone: [phone] Relation: None  Code Status:  Full Code  Goals of care: Advanced Directive information Advanced Directives 04/30/2021  Does Patient Have a Medical Advance Directive? Yes  Type of Paramedic of [place];Living will  Does patient want to make changes to medical advance directive? No - Patient declined  Copy of [place] in Chart? Yes - validated most recent copy scanned in chart (See row information)  Would patient like information on creating a medical advance directive? -  Pre-existing out of facility DNR order (yellow form or pink MOST form) -     Chief Complaint  Patient presents with   Acute Visit    Patient complains of swelling. Patient has gained 5lbs in 5 days.    HPI:  Pt is a 76 y.o. male seen today for an acute visit for evaluation of swelling on the legs.Has had 5 lbs over 5 days.states had ham x 3 days  about 5 slices.states was good  but will not eat it again.  Has had some shortness of breath. Takes Furosemide 20 mg tablet on Monday,Wednesday and Friday.  Brought in all his medication to visit today.Med reviewed.    Past Medical History:  Diagnosis Date   Anemia    Anxiety    Benign paroxysmal positional vertigo    Bipolar I disorder, most recent episode (or current)  unspecified    Celiac disease    CHF (congestive heart failure) (HCC)    Chronic kidney disease, stage III (moderate) (HCC)    Crohn's    Remicade q8 weeks   Depression    Essential and other specified forms of tremor    medication-induced Parkinson's, now resolved   Essential hypertension, benign    Gout 2018   Heart failure ([place])    Hypertrophy of prostate without urinary obstruction and other lower urinary tract symptoms (LUTS)    Impotence of organic origin    Insomnia with sleep apnea, unspecified    Iron deficiency anemia, unspecified    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   Neuralgia, neuritis, and radiculitis, unspecified    Other B-complex deficiencies    Other extrapyramidal disease and abnormal movement disorder    Postinflammatory pulmonary fibrosis ([place])    Tobacco use disorder    Vertigo 2018   Past Surgical History:  Procedure Laterality Date   CATARACT EXTRACTION, BILATERAL Bilateral 09/2018   CHOLECYSTECTOMY  07-12-2010   ICD IMPLANT N/A 01/02/2020   Procedure: ICD IMPLANT;  Surgeon: Evans Lance, MD;  Location: [facility];  Service: Cardiovascular;  Laterality: N/A;   RIGHT HEART CATH N/A 10/12/2018   Procedure: RIGHT HEART CATH;  Surgeon: Jolaine Artist, MD;  Location: [facility];  Service: Cardiovascular;  Laterality: N/A;   RIGHT/LEFT HEART CATH AND CORONARY ANGIOGRAPHY N/A 04/26/2019   Procedure: RIGHT/LEFT HEART CATH AND CORONARY ANGIOGRAPHY;  Surgeon: Larey Dresser, MD;  Location: [facility];  Service: Cardiovascular;  Laterality: N/A;   SMALL INTESTINE SURGERY     x 2    Allergies  Allergen Reactions   Azathioprine Other (See Comments)    REACTION: affected WBC "Almost died"   Ciprofloxacin Other (See Comments)    Reaction not recalled   Levaquin [Levofloxacin In D5w] Other (See Comments)    Reaction not rec   Plendil [Felodipine] Other (See Comments)    Reaction not not recalled    Outpatient Encounter Medications as of  04/30/2021  Medication Sig   acetaminophen (TYLENOL) 325 MG tablet Take 650 mg by mouth every 6 (six) hours as needed.   allopurinol (ZYLOPRIM) 100 MG tablet TAKE 2 TABLETS(200 MG) BY MOUTH DAILY   budesonide (ENTOCORT EC) 3 MG 24 hr capsule Take 3 capsules (9 mg total) by mouth daily.   Cholecalciferol (VITAMIN D) 125 MCG (5000 UT) CAPS Take 5,000 Units by mouth daily.   dapagliflozin propanediol (FARXIGA) 10 MG TABS tablet Take 1 tablet (10 mg total) by mouth daily before breakfast.   divalproex (DEPAKOTE ER) 500 MG 24 hr tablet TAKE 3 TABLETS(1500 MG) BY MOUTH AT BEDTIME   epoetin alfa (EPOGEN) 10000 UNIT/ML injection 10,000 Units every 30 (thirty) days.   ferrous sulfate 325 (65 FE) MG tablet Take 1 tablet (325 mg total) by mouth daily.   furosemide (LASIX) 20 MG tablet Take 1 tablet (20 mg total) by mouth daily.   inFLIXimab (REMICADE) 100 MG injection Infuse Remicade IV schedule 1 59m/kg every 8 weeks Premedicate with Tylenol 500-6575mby mouth and Benadryl 25-501my mouth prior to infusion. Last PPD was on 12/2009.    isosorbide mononitrate (IMDUR) 60 MG 24 hr tablet Take 1 tablet (60 mg total) by mouth daily.   ivabradine (CORLANOR) 5 MG TABS tablet TAKE 1 TABLET(5 MG) BY MOUTH TWICE DAILY WITH A MEAL   loperamide (IMODIUM) 2 MG capsule Take 1 capsule (2 mg total) by mouth 3 (three) times daily as needed for diarrhea or loose stools.   magnesium oxide (MAG-OX) 400 (240 Mg) MG tablet TAKE 1 TABLET BY MOUTH IN THE MORNING, AT NOON, AND AT BEDTIME   metoprolol succinate (TOPROL-XL) 25 MG 24 hr tablet Take 1.5 tablets (37.5 mg total) by mouth in the morning and at bedtime.   mirtazapine (REMERON) 7.5 MG tablet TAKE 1 TABLET(7.5 MG) BY MOUTH AT BEDTIME   Multiple Vitamin (MULTIVITAMIN WITH MINERALS) TABS tablet Take 1 tablet by mouth daily.   omeprazole (PRILOSEC OTC) 20 MG tablet Take 1 tablet (20 mg total) by mouth daily. For GERD   QUEtiapine (SEROQUEL) 100 MG tablet Take 1.5 tablets (150  mg total) by mouth at bedtime.   solifenacin (VESICARE) 5 MG tablet TAKE 1 TABLET(5 MG) BY MOUTH DAILY   spironolactone (ALDACTONE) 25 MG tablet Take 0.5 tablets (12.5 mg total) by mouth daily.   tamsulosin (FLOMAX) 0.4 MG CAPS capsule Take 1 capsule (0.4 mg total) by mouth daily.   vitamin B-12 (CYANOCOBALAMIN) 500 MCG tablet Take 2 tablets (1,000 mcg total) by mouth daily.   apixaban (ELIQUIS) 5 MG TABS tablet Take 1 tablet (5 mg total) by mouth 2 (two) times daily.   No facility-administered encounter medications on file as of 04/30/2021.    Review of Systems  Constitutional:  Negative for appetite change, chills, fatigue, fever and unexpected weight change.  Eyes:  Negative for pain, discharge, redness, itching and visual disturbance.  Respiratory:  Negative for cough, chest tightness, shortness of breath and wheezing.   Cardiovascular:  Positive  for leg swelling. Negative for chest pain and palpitations.  Gastrointestinal:  Negative for abdominal distention, abdominal pain, nausea and vomiting.  Genitourinary:  Negative for difficulty urinating, dysuria, flank pain, frequency and urgency.  Musculoskeletal:  Negative for arthralgias, back pain, gait problem, joint swelling, myalgias, neck pain and neck stiffness.  Skin:  Negative for color change, pallor and rash.  Neurological:  Negative for dizziness, weakness, light-headedness, numbness and headaches.   Immunization History  Administered Date(s) Administered   Fluad Quad(high Dose 65+) 11/15/2018, 11/17/2019, 11/19/2020   H1N1 02/07/2008   Influenza, High Dose Seasonal PF 11/26/2016, 12/15/2017   Influenza, Seasonal, Injecte, Preservative Fre 12/12/2008, 11/07/2009, 11/04/2010   Influenza,inj,Quad PF,6+ Mos 11/13/2014, 01/03/2016   Influenza-Unspecified 01/11/1997, 12/15/1997, 03/02/2000, 01/02/2003, 12/18/2003, 12/30/2004, 12/10/2005, 12/25/2005, 01/11/2007, 02/07/2008, 12/08/2011, 10/25/2012, 12/16/2012, 12/09/2013, 11/28/2014,  12/31/2015, 11/24/2016, 10/29/2017, 11/24/2017, 12/15/2017, 11/25/2018   Moderna Covid-19 Vaccine Bivalent Booster 77yr & up 11/19/2020   Moderna Sars-Covid-2 Vaccination 04/20/2019, 05/18/2019, 10/25/2019   PPD Test 12/30/2010, 01/05/2012, 01/07/2013, 01/13/2014   Pneumococcal Conjugate-13 01/03/2014, 08/10/2014   Pneumococcal Polysaccharide-23 03/02/2000, 03/05/2004, 03/27/2005, 01/09/2015, 01/03/2016, 05/06/2018   Pneumococcal-Unspecified 12/25/2005   Tdap 05/12/2011, 05/26/2011   Zoster Recombinat (Shingrix) 05/13/2018, 04/01/2019   Pertinent  Health Maintenance Due  Topic Date Due   URINE MICROALBUMIN  Never done   FOOT EXAM  08/09/2020   OPHTHALMOLOGY EXAM  06/28/2021   HEMOGLOBIN A1C  08/26/2021   INFLUENZA VACCINE  Completed   COLONOSCOPY (Pts [phone] coverage will need to be confirmed)  Discontinued   Fall Risk 03/01/2021 03/26/2021 03/26/2021 04/12/2021 04/30/2021  Falls in the past year? - - 0 0 0  Was there an injury with Fall? - - - 0 0  Fall Risk Category Calculator - - - 0 0  Fall Risk Category - - - Low Low  Patient Fall Risk Level High fall risk Moderate fall risk Moderate fall risk Moderate fall risk Low fall risk  Patient at Risk for Falls Due to - - Impaired balance/gait Impaired balance/gait No Fall Risks  Patient at Risk for Falls Due to - - - - -  Fall risk Follow up - - Education provided;Falls prevention discussed Falls evaluation completed;Education provided;Falls prevention discussed Falls evaluation completed   Functional Status Survey:    There were no vitals filed for this visit. There is no height or weight on file to calculate BMI. Physical Exam Vitals reviewed.  Constitutional:      General: He is not in acute distress.    Appearance: Normal appearance. He is normal weight. He is not ill-appearing or diaphoretic.  HENT:     Head: Normocephalic.  Eyes:     General: No scleral icterus.       Right eye: No discharge.        Left eye: No  discharge.     Conjunctiva/sclera: Conjunctivae normal.     Pupils: Pupils are equal, round, and reactive to light.  Neck:     Vascular: No carotid bruit.  Cardiovascular:     Rate and Rhythm: Normal rate and regular rhythm.     Pulses: Normal pulses.     Heart sounds: Normal heart sounds. No murmur heard.   No friction rub. No gallop.  Pulmonary:     Effort: Pulmonary effort is normal. No respiratory distress.     Breath sounds: Normal breath sounds. No wheezing, rhonchi or rales.  Chest:     Chest wall: No tenderness.  Abdominal:     General: Bowel sounds are  normal. There is no distension.     Palpations: Abdomen is soft. There is no mass.     Tenderness: There is no abdominal tenderness. There is no right CVA tenderness, left CVA tenderness, guarding or rebound.  Musculoskeletal:        General: No swelling or tenderness. Normal range of motion.     Cervical back: Normal range of motion. No rigidity or tenderness.     Right lower leg: Edema present.     Left lower leg: Edema present.  Lymphadenopathy:     Cervical: No cervical adenopathy.  Skin:    General: Skin is warm and dry.     Coloration: Skin is not pale.     Findings: No erythema or rash.  Neurological:     Mental Status: He is alert and oriented to person, place, and time.     Motor: No weakness.     Gait: Gait abnormal.  Psychiatric:        Mood and Affect: Mood normal.        Speech: Speech normal.        Behavior: Behavior normal.    Labs reviewed: Recent Labs    10/10/20 0154 10/11/20 0342 10/12/20 0650 02/27/21 0310 02/27/21 1353 02/28/21 0255 02/28/21 1432 03/27/21 1334 04/08/21 0902 04/12/21 0900  NA 142 144   < > 134*  --  138   < > 138 144 142  K 3.9 3.9   < > 5.7*   < > 5.5*   < > 3.7 4.2 4.5  CL 114* 109   < > 102  --  111   < > 104 107 106  CO2 22 25   < > 27  --  23   < > 25 31 28   GLUCOSE 119* 101*   < > 113*  --  97   < > 102* 85 96  BUN 22 19   < > 36*  --  35*   < > 35* 43* 29*   CREATININE 1.81* 1.92*   < > 3.01*  --  2.81*   < > 2.44* 2.24* 2.15*  CALCIUM 8.5* 8.9   < > 8.9  --  8.6*   < > 8.5* 8.6 8.6*  MG 1.4* 2.0   < > 1.8  --  1.7  --   --  1.2*  --   PHOS 1.6* 3.8  --   --   --   --   --   --   --   --    < > = values in this interval not displayed.   Recent Labs    10/10/20 0154 10/31/20 1945 11/01/20 0245 11/06/20 1220 04/08/21 0902  AST 11* 25 23 11 16   ALT 9 26 27 11 13   ALKPHOS 49 81 80  --   --   BILITOT 0.7 0.9 0.8 0.3 0.4  PROT 5.8* 6.0* 6.7 6.7 6.4  ALBUMIN 2.2* 2.5* 2.6*  --   --    Recent Labs    11/06/20 1220 11/14/20 1900 02/27/21 0310 02/28/21 0255 03/08/21 0000 04/08/21 0902 04/26/21 1055  WBC 10.8   < > 8.6 9.7 9.3 9.5  --   NEUTROABS 7,722  --  5.9  --   --  5,957  --   HGB 9.2*   < > 10.5* 9.8* 10.2* 10.2* 10.8*  HCT 28.4*   < > 33.0* 31.2* 33* 31.6* 34.5*  MCV 103.3*   < > 107.8* 109.9*  --  103.6*  --   PLT 147   < > 145* 155 170 127*  --    < > = values in this interval not displayed.   Lab Results  Component Value Date   TSH 3.10 04/08/2021   Lab Results  Component Value Date   HGBA1C 4.9 02/26/2021   Lab Results  Component Value Date   CHOL 147 04/08/2021   HDL 58 04/08/2021   LDLCALC 71 04/08/2021   TRIG 97 04/08/2021   CHOLHDL 2.5 04/08/2021    Significant Diagnostic Results in last 30 days:  CUP PACEART REMOTE DEVICE CHECK  Result Date: 04/02/2021 Scheduled remote reviewed. Normal device function.  [place]- Eliquis Next remote 91 days- JJB   Assessment/Plan 1. Chronic combined systolic and diastolic congestive heart failure (HCC) Has had 5 pounds over the last 5 days. Has lower extremities edema.  No shortness of breath wheezing or cough. Advised to keep legs elevated. Continue to idea to a low-salt diet Advised to take extra 20 mg tablet of furosemide for total of 40 mg daily for the next 3 days then resume his regular 20 mg on Monday Wednesday and Friday.  I spoke with caregiver on patient's  phone and advised to adjust medication. - Continue to monitor weights daily and notify provider for any abrupt weight gain. - furosemide (LASIX) 20 MG tablet; Monday, Wednesday, and Friday. Take extra 20 mg tablet ( total 40 mg) x 3 days then resume regular dose on Mon,wed- Fri  Dispense: 30 tablet; Refill: 5  2. Edema of both lower extremities +2-3 edema worse on right ankle. Encouraged to keep legs elevated whenever he is seated Encouraged a low sodium diet and to avoid foods such as unpaid bacon which are higher in salt. Advised to increase furosemide as above. - furosemide (LASIX) 20 MG tablet; Monday, Wednesday, and Friday. Take extra 20 mg tablet ( total 40 mg) x 3 days then resume regular dose on Mon,wed- Fri  Dispense: 30 tablet; Refill: 5  Family/ staff Communication: Reviewed plan of care with patient and patient caregiver verbalized understanding.  Labs/tests ordered: None   Next Appointment: As needed if symptoms worsen or fail to improve  Sandrea Hughs, NP

## 2021-04-30 NOTE — Patient Instructions (Signed)
-    Take Furosemide 20 mg tablet take 2 tablet ( 40 mg ) daily x 3 days then resume 20 mg tablet tablet on Monday,Wednesday and Friday  ? ?- Reduce salt diet  ? ?- elevated legs when seated  ? ?- check weight daily and notify provider for any abrupt weight gain  ?

## 2021-05-01 ENCOUNTER — Other Ambulatory Visit: Payer: Self-pay | Admitting: Adult Health

## 2021-05-01 ENCOUNTER — Other Ambulatory Visit (HOSPITAL_COMMUNITY): Payer: Self-pay

## 2021-05-01 DIAGNOSIS — M1A9XX0 Chronic gout, unspecified, without tophus (tophi): Secondary | ICD-10-CM

## 2021-05-01 NOTE — Progress Notes (Signed)
Paramedicine Encounter ? ? ? Patient ID: Brent [name], male    DOB: April 22, 1945, 76 y.o.   MRN: [id number] ? ? ?Arrived to see [name] for a med rec today in the home. I reviewd notes from PCP yesterday and instructions to increase Take Furosemide 20 mg tablet take 2 tablet ( 40 mg ) daily x 3 days then resume 20 mg tablet tablets. I reviewed all meds and filled two weeks of pill boxes for him with this change reflected for this weeks pill box. He is aware I will be out of town next week and I stressed the importance of adhering to a low salt diet and watching his fluid intake as well as weighing daily and elevating his legs. He agreed with plan.  ? ?We reviewed upcoming appointments and he is aware of same with all wrote down in planner.  ? ?I will see [name] in two weeks.  ? ?Refills: ?[facility] Divaprolex  ? ?[facility]- Magnesium, Allupurinol, Remeron ? ? ?ACTION: ?Home visit completed ? ? ? ? ? ? ?

## 2021-05-06 NOTE — Progress Notes (Incomplete)
PCP: Sandrea Hughs, NP Cardiology: Dr. Leandro Reasoner Brent [name] is a 76 y.o. with a history of Crohn's disease, celiac disease, HTN, COPD, prior tobacco use, CKD 3, chronic anemia, and bipolar disorder. Diagnosed with systolic HF in 9/93 (echo with EF 30-35%).     Admitted 3/11-3/16/20 with SOB and hypoxia. Found to have PNA and required BIPAP and antibiotics. Blood cultures were negative. Echo showed newly reduced EF 30-35%. Cardiology consulted and he had nuclear stress test, which was read as "high risk" due to low EF. Per Dr Sallyanne Kuster, did not think changes were indicative of coronary disease and cath was not pursued due this and due to CKD. HF meds optimized. Limited with CKD3. He had one short run of NSVT. Also had AKI on CKD3, but creatinine improved to baseline 1.77 on day of discharge.   He was seen in ED 05/12/18 with SOB after misplacing his nebulizer. BNP was elevated, so he was given 40 mg IV lasix and discharged with lasix 20 mg BID. Referred to HF clinic.   He was admitted again in 7/20 with weakness, orthostatic hypotension, and AKI.  Cardiac meds were held.  He was noted to be severely deconditioned, and he was discharged to SNF.  He remained in SNF x 2 wks and is now home.   Echo was done in 8/20, EF 20-25% with mild LVH and normal RV.  He had RHC in 8/20 showing normal filling pressures and good cardiac output (surprisingly).   LHC/RHC in 3/21 showed no significant CAD, low filling pressures, preserved cardiac output. Echo in 9/21 with EF 25-30%.  Medtronic ICD placed in 11/21.   Patient was admitted in 1/23 with AKI and hyperkalemia. He was gently hydrated and sent home.    Echo was done today and reviewed, EF 20-25%, global HK, normal RV, IVC normal.   He returns today for followup of CHF.  He is back on all his home meds form prior to 1/23 admission except for Farxiga.  He tends to follow a high sodium diet.  He is short of breath after walking 200 feet, this is chronic.  No  orthopnea/PND.  No chest pain.  No lightheadedness.  Weight is stable.     ECG (personally reviewed): NSR, LVH  Labs (7/20): K 5.1, creatinine 2.25 Labs (8/20): K 4.8, creatinine 2.65 Labs (9/20): hgb 9.7 Labs (12/20): K 4.1, creatinine 1.76 Labs (3/21): K 5, creatinine 2.25 Labs (6/21): hgb 12 Labs (11/21): K 4.3, creatinine 2.62, hgb 12.2 Labs (10/22): hgb 10.8, K 4.8, creatinine 2.73 Labs (1/23): K 4.3, creatinine 1.8  PMH: 1. Crohns disease: Gets Remicade.  2. Celiac disease.  3. COPD: Prior smoker.  - CT chest (7/20) with mild emphysema.  - PFTs (9/20): Mild obstruction, mild restriction.  - High resolution CT chest (10/20): No ILD, +chronic bronchitis.  4. CKD: Stage 3.  5. HTN 6. Bipolar disorder 7. H/o BPPV 8. Gout 9. Chronic systolic CHF: Nonischemic cardiomyopathy.  Medtronic ICD.  - Echo (3/20): EF 30-35%, mild LVH, normal RV.  - Cardiolite (3/20): EF 28%, no ischemia, fixed defects in anteroseptal and inferolateral walls.  - Echo (8/20): EF 20-25%, diffuse hypokinesis, mild LVH, normal RV size and systolic function, normal IVC size.  - RHC (8/20): mean RA 3, PA 21/10, mean PCWP 5, CI 3.2 Fick and 3.5 Thermo - RHC/LHC (3/21): No significant CAD; mean RA 1, PA 20/6, mean PCWP 6, CI 4.57 - Echo (9/21): EF 25-30%, normal RV.  - Echo (  2/23): EF 20-25%, normal RV.  10. Atrial fibrillation: Paroxysmal.   Social History   Socioeconomic History   Marital status: Divorced    Spouse name: Not on file   Number of children: 1   Years of education: 12   Highest education level: Not on file  Occupational History   Occupation: retired   Occupation: Veteran  Tobacco Use   Smoking status: Former    Packs/day: 1.00    Years: 49.00    Pack years: 49.00    Types: Cigarettes    Start date: 04/11/1956    Quit date: 04/17/2014    Years since quitting: 7.0   Smokeless tobacco: Never  Vaping Use   Vaping Use: Never used  Substance and Sexual Activity   Alcohol use: No     Alcohol/week: 0.0 standard drinks   Drug use: No   Sexual activity: Never  Other Topics Concern   Not on file  Social History Narrative   Not on file   Social Determinants of Health   Financial Resource Strain: Not on file  Food Insecurity: No Food Insecurity   Worried About Charity fundraiser in the Last Year: Never true   Freedom in the Last Year: Never true  Transportation Needs: No Transportation Needs   Lack of Transportation (Medical): No   Lack of Transportation (Non-Medical): No  Physical Activity: Not on file  Stress: Not on file  Social Connections: Not on file  Intimate Partner Violence: Not on file   Family History  Problem Relation Age of Onset   Diabetes Mother        maternal grandmother   Uterine cancer Mother    Emphysema Father    Pneumonia Maternal Grandmother    Colon cancer Neg Hx    ROS: All systems reviewed and negative except as per HPI.   Current Outpatient Medications  Medication Sig Dispense Refill   acetaminophen (TYLENOL) 325 MG tablet Take 650 mg by mouth every 6 (six) hours as needed.     allopurinol (ZYLOPRIM) 100 MG tablet TAKE 2 TABLETS(200 MG) BY MOUTH DAILY 60 tablet 3   apixaban (ELIQUIS) 5 MG TABS tablet Take 1 tablet (5 mg total) by mouth 2 (two) times daily. 60 tablet 0   budesonide (ENTOCORT EC) 3 MG 24 hr capsule Take 3 capsules (9 mg total) by mouth daily. 90 capsule 0   Cholecalciferol (VITAMIN D) 125 MCG (5000 UT) CAPS Take 5,000 Units by mouth daily. 30 capsule 0   dapagliflozin propanediol (FARXIGA) 10 MG TABS tablet Take 1 tablet (10 mg total) by mouth daily before breakfast. 30 tablet 11   divalproex (DEPAKOTE ER) 500 MG 24 hr tablet TAKE 3 TABLETS(1500 MG) BY MOUTH AT BEDTIME 90 tablet 0   epoetin alfa (EPOGEN) 10000 UNIT/ML injection 10,000 Units every 30 (thirty) days.     Ferrous Sulfate Dried (FEOSOL) 200 (65 Fe) MG TABS Take 1 tablet by mouth daily.     furosemide (LASIX) 20 MG tablet Monday, Wednesday, and  Friday. Take extra 20 mg tablet ( total 40 mg) x 3 days then resume regular dose on Mon,wed- Fri 30 tablet 5   inFLIXimab (REMICADE) 100 MG injection Infuse Remicade IV schedule 1 5m/kg every 8 weeks Premedicate with Tylenol 500-6542mby mouth and Benadryl 25-5044my mouth prior to infusion. Last PPD was on 12/2009.  1 each 6   isosorbide mononitrate (IMDUR) 60 MG 24 hr tablet Take 1 tablet (60 mg total) by mouth  daily. 90 tablet 3   ivabradine (CORLANOR) 5 MG TABS tablet TAKE 1 TABLET(5 MG) BY MOUTH TWICE DAILY WITH A MEAL (Patient not taking: Reported on 05/01/2021) 180 tablet 3   loperamide (IMODIUM) 2 MG capsule Take 1 capsule (2 mg total) by mouth 3 (three) times daily as needed for diarrhea or loose stools. 30 capsule 0   magnesium oxide (MAG-OX) 400 (240 Mg) MG tablet TAKE 1 TABLET BY MOUTH IN THE MORNING, AT NOON, AND AT BEDTIME 90 tablet 5   metoprolol succinate (TOPROL-XL) 25 MG 24 hr tablet Take 1.5 tablets (37.5 mg total) by mouth in the morning and at bedtime. 90 tablet 6   mirtazapine (REMERON) 7.5 MG tablet TAKE 1 TABLET(7.5 MG) BY MOUTH AT BEDTIME 30 tablet 3   Multiple Vitamin (MULTIVITAMIN WITH MINERALS) TABS tablet Take 1 tablet by mouth daily.     omeprazole (PRILOSEC OTC) 20 MG tablet Take 1 tablet (20 mg total) by mouth daily. For GERD 30 tablet 0   QUEtiapine (SEROQUEL) 100 MG tablet Take 1.5 tablets (150 mg total) by mouth at bedtime. 45 tablet 5   solifenacin (VESICARE) 5 MG tablet TAKE 1 TABLET(5 MG) BY MOUTH DAILY 30 tablet 0   spironolactone (ALDACTONE) 25 MG tablet Take 0.5 tablets (12.5 mg total) by mouth daily. 15 tablet 6   tamsulosin (FLOMAX) 0.4 MG CAPS capsule Take 1 capsule (0.4 mg total) by mouth daily. 30 capsule 0   vitamin B-12 (CYANOCOBALAMIN) 1000 MCG tablet Take 1,000 mcg by mouth daily.     No current facility-administered medications for this visit.   There were no vitals taken for this visit. General: NAD Neck: No JVD, no thyromegaly or thyroid  nodule.  Lungs: Clear to auscultation bilaterally with normal respiratory effort. CV: Nondisplaced PMI.  Heart regular S1/S2, no S3/S4, no murmur. 1+ ankle edema.  No carotid bruit.  Normal pedal pulses.  Abdomen: Soft, nontender, no hepatosplenomegaly, no distention.  Skin: Intact without lesions or rashes.  Neurologic: Alert and oriented x 3.  Psych: Normal affect. Extremities: No clubbing or cyanosis.  HEENT: Normal.   Assessment/Plan: 1. Chronic systolic CHF: Nonischemic cardiomyoapthy.  Diagnosed by echo in 3/20, EF 30-35%.  LHC in 3/21 with no significant CAD. He has no chest pain.  Medication titration has been limited by orthostasis/low BP.  Echo in 8/20 showed EF 20-25% with mild LVH and normal RV function.  With resting sinus tachycardia, I was concerned for low output HF, but RHC in 8/20 and again in 3/21 showed normal cardiac output and filling pressures. Echo in 9/21 with EF 25-30%, Medtronic ICD placed.  Echo 2/23 with EF 20-25%.  NYHA class II, not volume overloaded by exam.   Weight stable. Has had trouble with low BP and orthostasis in the past, but BP good today.  Also, med titration has been limited by CKD.  - Cannot tolerate hydralazine due to orthostatic symptoms.  - Continue Toprol XL 37.5 mg bid.  - Continue ivabradine 5 mg bid.   - Restart Farxiga 10 mg daily and decrease Lasix to 20 mg every other day.  BMET/BNP today and BMET 10 days.  - No ACEI/ARB/ARNI with elevated creatinine.  - Continue spironolactone 12.5 daily.  - He would be a difficult candidate for advanced therapies with deconditioning, renal dysfunction, and lives alone.  2. CKD: Stage 3.  BMET today.  3. COPD: Mild emphysema on prior chest CT.  PFTs in 9/20 showed mild obstruction and mild restriction.  High resolution  CT chest 10/20 showed no ILD, +chronic bronchitis.   Followup with NP in 6 wks.    [place] 05/06/2021

## 2021-05-07 ENCOUNTER — Other Ambulatory Visit: Payer: Self-pay | Admitting: *Deleted

## 2021-05-07 DIAGNOSIS — I739 Peripheral vascular disease, unspecified: Secondary | ICD-10-CM | POA: Diagnosis not present

## 2021-05-07 DIAGNOSIS — L03032 Cellulitis of left toe: Secondary | ICD-10-CM | POA: Diagnosis not present

## 2021-05-07 DIAGNOSIS — L02611 Cutaneous abscess of right foot: Secondary | ICD-10-CM | POA: Diagnosis not present

## 2021-05-07 DIAGNOSIS — B351 Tinea unguium: Secondary | ICD-10-CM | POA: Diagnosis not present

## 2021-05-07 DIAGNOSIS — L84 Corns and callosities: Secondary | ICD-10-CM | POA: Diagnosis not present

## 2021-05-07 NOTE — Patient Outreach (Signed)
[facility]) Care Management Telephonic RN Care Manager Note   05/07/2021 Name:  Brent [name] MRN:  [id number] DOB:  [birth year]  Summary: PT REQUESTED CLOSURE OF SERVICES WITH [facility].   Subjective: Brent [name] is an 76 y.o. year old male who is a primary patient of [name], Dinah C, NP. The care management team was consulted for assistance with care management and/or care coordination needs.    Telephonic RN Care Manager completed Telephone Visit today.  Objective:   Medications Reviewed Today     Reviewed by Maralyn Sago, EMT (Certified Medical Assistant) on 05/01/21 at 1813  Med List Status: <None>   Medication Order Taking? Sig Documenting Provider Last Dose Status Informant  acetaminophen (TYLENOL) 325 MG tablet [id number] Yes Take 650 mg by mouth every 6 (six) hours as needed. [provider] Taking Active   allopurinol (ZYLOPRIM) 100 MG tablet [id number] Yes TAKE 2 TABLETS(200 MG) BY MOUTH DAILY Medina-Vargas, Monina C, NP Taking Active   apixaban (ELIQUIS) 5 MG TABS tablet [id number] Yes Take 1 tablet (5 mg total) by mouth 2 (two) times daily. Medina-Vargas, Monina C, NP Taking Active   budesonide (ENTOCORT EC) 3 MG 24 hr capsule [id number] Yes Take 3 capsules (9 mg total) by mouth daily. Medina-Vargas, Monina C, NP Taking Active   Cholecalciferol (VITAMIN D) 125 MCG (5000 UT) CAPS [id number] Yes Take 5,000 Units by mouth daily. Medina-Vargas, Monina C, NP Taking Active Self, Care Giver  dapagliflozin propanediol (FARXIGA) 10 MG TABS tablet [id number] Yes Take 1 tablet (10 mg total) by mouth daily before breakfast. Laurey Morale, MD Taking Active            Med Note Karleen Hampshire, Macarthur Critchley   Wed Apr 24, 2021  5:15 PM) Waiting on shipment from [facility]  divalproex (DEPAKOTE ER) 500 MG 24 hr tablet [id number] Yes TAKE 3 TABLETS(1500 MG) BY MOUTH AT BEDTIME Medina-Vargas, Monina C, NP Taking Active   epoetin alfa (EPOGEN) 10000 UNIT/ML injection [id number]  Yes 10,000 Units every 30 (thirty) days. [provider] Taking Active Self, Care Giver  Ferrous Sulfate Dried (FEOSOL) 200 (65 Fe) MG TABS [id number] Yes Take 1 tablet by mouth daily. [provider] Taking Active   furosemide (LASIX) 20 MG tablet [id number] Yes Monday, Wednesday, and Friday. Take extra 20 mg tablet ( total 40 mg) x 3 days then resume regular dose on Mon,wed- Fri [name], Dinah C, NP Taking Active   inFLIXimab (REMICADE) 100 MG injection [id number] Yes Infuse Remicade IV schedule 1 5mg /kg every 8 weeks Premedicate with Tylenol 500-650mg  by mouth and Benadryl 25-50mg  by mouth prior to infusion. Last PPD was on 12/2009.  Rachael Fee, MD Taking Active Self, Care Giver           Med Note Normand Sloop, JASMINE E   Tue Nov 06, 2020 11:21 AM)    isosorbide mononitrate (IMDUR) 60 MG 24 hr tablet [id number] Yes Take 1 tablet (60 mg total) by mouth daily. Laurey Morale, MD Taking Active   ivabradine [facility]) 5 MG TABS tablet [id number]  TAKE 1 TABLET(5 MG) BY MOUTH TWICE DAILY WITH A MEAL Laurey Morale, MD  Active            Med Note Karleen Hampshire, HEATHER L   Wed Apr 24, 2021  5:15 PM) Waiting shipment from [facility].  loperamide (IMODIUM) 2 MG capsule [id number] Yes Take 1 capsule (2 mg total) by mouth 3 (three) times daily as needed for diarrhea or  loose stools. Medina-Vargas, Margit Banda, NP Taking Active Self, Care Giver           Med Note Normand Sloop, JASMINE E   Tue Jan 29, 2021  1:09 PM)    magnesium oxide (MAG-OX) 400 (240 Mg) MG tablet [id number] Yes TAKE 1 TABLET BY MOUTH IN THE MORNING, AT NOON, AND AT BEDTIME [name], Dinah C, NP Taking Active   metoprolol succinate (TOPROL-XL) 25 MG 24 hr tablet [id number] Yes Take 1.5 tablets (37.5 mg total) by mouth in the morning and at bedtime. Laurey Morale, MD Taking Active   mirtazapine (REMERON) 7.5 MG tablet [id number] Yes TAKE 1 TABLET(7.5 MG) BY MOUTH AT BEDTIME [name], Dinah C, NP Taking Active   Multiple Vitamin  (MULTIVITAMIN WITH MINERALS) TABS tablet [id number] Yes Take 1 tablet by mouth daily. Elease Etienne, MD Taking Active Self, Care Giver  omeprazole (PRILOSEC OTC) 20 MG tablet [id number] Yes Take 1 tablet (20 mg total) by mouth daily. For GERD Medina-Vargas, Monina C, NP Taking Active   QUEtiapine (SEROQUEL) 100 MG tablet [id number] Yes Take 1.5 tablets (150 mg total) by mouth at bedtime. Medina-Vargas, Monina C, NP Taking Active   solifenacin (VESICARE) 5 MG tablet [id number] Yes TAKE 1 TABLET(5 MG) BY MOUTH DAILY Medina-Vargas, Monina C, NP Taking Active   spironolactone (ALDACTONE) 25 MG tablet [id number] Yes Take 0.5 tablets (12.5 mg total) by mouth daily. Laurey Morale, MD Taking Active   tamsulosin [facility]) 0.4 MG CAPS capsule [id number] Yes Take 1 capsule (0.4 mg total) by mouth daily. Medina-Vargas, Monina C, NP Taking Active   vitamin B-12 (CYANOCOBALAMIN) 1000 MCG tablet [id number] Yes Take 1,000 mcg by mouth daily. [provider] Taking Active   Med List Note Reginia Forts, Manley Mason, CPhT 11/01/20 1110): [name] - in charge of home health medications [phone]  EMT (caregiver for his medications Herbert Seta [phone])             SDOH:  (Social Determinants of Health) assessments and interventions performed:     Care Plan  Review of patient past medical history, allergies, medications, health status, including review of consultants reports, laboratory and other test data, was performed as part of comprehensive evaluation for care management services.   Care Plan : RN Care Manger Plan of Care  Updates made by Alejandro Mulling, RN since 05/07/2021 12:00 AM     Problem: Knowledge Deficit related to CHF management and care coordination needs   Priority: High     Long-Range Goal: Establish Plan of Care for managing Complex care coordination needs in pt with CHF   Start Date: 03/26/2021  Expected End Date: 08/23/2021  Recent Progress: On track  Priority: High  Note:    Current Barriers:  Knowledge Deficits related to plan of care for management of CHF   RNCM Clinical Goal(s):  Patient will verbalize understanding of plan for management of CHF as evidenced by teach back method of education take all medications exactly as prescribed and will call provider for medication related questions as evidenced by self reporting via chart review demonstrate understanding of rationale for each prescribed medication as evidenced by self reporting education and   through collaboration with RN Care manager, provider, and care team.   Interventions: Inter-disciplinary care team collaboration (see longitudinal plan of care) Evaluation of current treatment plan related to  self management and patient's adherence to plan as established by provider   Heart Failure Interventions:  (Status:  Goal on track:  Yes.) Long Term Goal Basic  overview and discussion of pathophysiology of Heart Failure reviewed Provided education on low sodium diet Reviewed Heart Failure Action Plan in depth and provided written copy Assessed need for readable accurate scales in home Provided education about placing scale on hard, flat surface Advised patient to weigh each morning after emptying bladder Discussed importance of daily weight and advised patient to weigh and record daily Reviewed role of diuretics in prevention of fluid overload and management of heart failure; Discussed the importance of keeping all appointments with provider Screening for signs and symptoms of depression related to chronic disease state   2/22-Update: Pt continue to do well with his daily weights reporting 173-174 lbs with no related symptoms of exacerbation over the last month. Pt continues services with para-medicine and [facility] for ongoing services with no acute events. Will continue to reiterate on the reviewed plan of care with all interventions and related goals. Al inquires and/or questions address  with no acute needs at this time.  Patient Goals/Self-Care Activities: Take all medications as prescribed Attend all scheduled provider appointments Call pharmacy for medication refills 3-7 days in advance of running out of medications Attend church or other social activities Perform all self care activities independently  Perform IADL's (shopping, preparing meals, housekeeping, managing finances) independently Call provider office for new concerns or questions  call office if I gain more than 2 pounds in one day or 5 pounds in one week do ankle pumps when sitting keep legs up while sitting track weight in diary use salt in moderation watch for swelling in feet, ankles and legs every day weigh myself daily begin a heart failure diary bring diary to all appointments develop a rescue plan follow rescue plan if symptoms flare-up eat more whole grains, fruits and vegetables, lean meats and healthy fats know when to call the doctor:with symptoms in the YELLOW zone or when symptomatic track symptoms and what helps feel better or worse  Follow Up Plan:  Telephone follow up appointment with care management team member scheduled for:  March 2023 The patient has been provided with contact information for the care management team and has been advised to call with any health related questions or concerns.  05/07/2021 [facility] OFFICE RECEIVED A CALL FROM PT REQUESTED CLOSURE OF HIS ENROLLMENT AND TO REMOVE ALL PENDING APPOINTMENTS. CMA ATTEMPTED TO EXPLAIN [facility] DOES NOT OVERLAP OR DUPLICATE ANY OTHER AGENCY INVOLVEMENT THAT MAY SERVICE HIM FOR OTHER MEDICAL INITIATIVES. BASED UPON PT'S ADAMANT REQUEST THIS CASE WILL BE CLOSED AND PCP WILL BE ALERTED.       Elliot Cousin, RN Care Management Coordinator [facility] Main Office [phone]

## 2021-05-08 ENCOUNTER — Ambulatory Visit (HOSPITAL_COMMUNITY)
Admission: RE | Admit: 2021-05-08 | Discharge: 2021-05-08 | Disposition: A | Discharge status: Home / Self Care | Payer: Medicare Other | Source: Ambulatory Visit | Attending: Family Medicine | Admitting: Family Medicine

## 2021-05-08 ENCOUNTER — Encounter (HOSPITAL_COMMUNITY): Payer: Self-pay

## 2021-05-08 ENCOUNTER — Other Ambulatory Visit: Payer: Self-pay

## 2021-05-08 VITALS — BP 130/80 | HR 96 | Wt 174.4 lb

## 2021-05-08 DIAGNOSIS — N183 Chronic kidney disease, stage 3 unspecified: Secondary | ICD-10-CM | POA: Diagnosis not present

## 2021-05-08 DIAGNOSIS — K5090 Crohn's disease, unspecified, without complications: Secondary | ICD-10-CM | POA: Diagnosis not present

## 2021-05-08 DIAGNOSIS — J449 Chronic obstructive pulmonary disease, unspecified: Secondary | ICD-10-CM

## 2021-05-08 DIAGNOSIS — F319 Bipolar disorder, unspecified: Secondary | ICD-10-CM | POA: Insufficient documentation

## 2021-05-08 DIAGNOSIS — Z79899 Other long term (current) drug therapy: Secondary | ICD-10-CM | POA: Insufficient documentation

## 2021-05-08 DIAGNOSIS — I428 Other cardiomyopathies: Secondary | ICD-10-CM | POA: Diagnosis not present

## 2021-05-08 DIAGNOSIS — Z8701 Personal history of pneumonia (recurrent): Secondary | ICD-10-CM | POA: Insufficient documentation

## 2021-05-08 DIAGNOSIS — Z602 Problems related to living alone: Secondary | ICD-10-CM | POA: Diagnosis not present

## 2021-05-08 DIAGNOSIS — I5022 Chronic systolic (congestive) heart failure: Secondary | ICD-10-CM | POA: Insufficient documentation

## 2021-05-08 DIAGNOSIS — Z95810 Presence of automatic (implantable) cardiac defibrillator: Secondary | ICD-10-CM | POA: Insufficient documentation

## 2021-05-08 DIAGNOSIS — I130 Hypertensive heart and chronic kidney disease with heart failure and stage 1 through stage 4 chronic kidney disease, or unspecified chronic kidney disease: Secondary | ICD-10-CM | POA: Insufficient documentation

## 2021-05-08 DIAGNOSIS — I951 Orthostatic hypotension: Secondary | ICD-10-CM | POA: Insufficient documentation

## 2021-05-08 DIAGNOSIS — Z7984 Long term (current) use of oral hypoglycemic drugs: Secondary | ICD-10-CM | POA: Diagnosis not present

## 2021-05-08 DIAGNOSIS — J439 Emphysema, unspecified: Secondary | ICD-10-CM | POA: Diagnosis not present

## 2021-05-08 DIAGNOSIS — K900 Celiac disease: Secondary | ICD-10-CM | POA: Insufficient documentation

## 2021-05-08 DIAGNOSIS — D631 Anemia in chronic kidney disease: Secondary | ICD-10-CM | POA: Diagnosis not present

## 2021-05-08 DIAGNOSIS — Z87891 Personal history of nicotine dependence: Secondary | ICD-10-CM | POA: Diagnosis not present

## 2021-05-08 DIAGNOSIS — I472 Ventricular tachycardia, unspecified: Secondary | ICD-10-CM | POA: Diagnosis not present

## 2021-05-08 DIAGNOSIS — I959 Hypotension, unspecified: Secondary | ICD-10-CM | POA: Diagnosis not present

## 2021-05-08 DIAGNOSIS — M7989 Other specified soft tissue disorders: Secondary | ICD-10-CM | POA: Insufficient documentation

## 2021-05-08 LAB — BASIC METABOLIC PANEL
Anion gap: 11 (ref 5–15)
BUN: 38 mg/dL — ABNORMAL HIGH (ref 8–23)
CO2: 24 mmol/L (ref 22–32)
Calcium: 8.5 mg/dL — ABNORMAL LOW (ref 8.9–10.3)
Chloride: 107 mmol/L (ref 98–111)
Creatinine, Ser: 2.12 mg/dL — ABNORMAL HIGH (ref 0.61–1.24)
GFR, Estimated: 32 mL/min — ABNORMAL LOW (ref >60)
Glucose, Bld: 88 mg/dL (ref 70–99)
Potassium: 3.6 mmol/L (ref 3.5–5.1)
Sodium: 142 mmol/L (ref 135–145)

## 2021-05-08 NOTE — Patient Instructions (Signed)
Labs done today, your results will be available in MyChart, we will contact you for abnormal readings. ? ? ?PLEASE WEAR YOUR COMPRESSION STOCKINGS ? ?Your physician recommends that you schedule a follow-up appointment in: Nurse Practitioner Clinic in 8 weeks. ? ?Your physician recommends that you schedule a follow-up appointment in: 4 months with Dr. [name] (July 2023)  **call the office in May to arrange your follow up. ? ? ?If you have any questions or concerns before your next appointment please send [name] a message through [place] or call our office at [phone].   ? ?TO LEAVE A MESSAGE FOR THE NURSE SELECT OPTION 2, PLEASE LEAVE A MESSAGE INCLUDING: ?YOUR NAME ?DATE OF BIRTH ?CALL BACK NUMBER ?REASON FOR CALL**this is important as we prioritize the call backs ? ?YOU WILL RECEIVE A CALL BACK THE SAME DAY AS LONG AS YOU CALL BEFORE 4:00 PM ? ?At the [facility], you and your health needs are our priority. As part of our continuing mission to provide you with exceptional heart care, we have created designated Provider Care Teams. These Care Teams include your primary Cardiologist (physician) and Advanced Practice Providers (APPs- Physician Assistants and Nurse Practitioners) who all work together to provide you with the care you need, when you need it.  ? ?You may see any of the following providers on your designated Care Team at your next follow up: ?Dr Glori Bickers ?Dr Loralie Champagne ?Darrick Grinder, NP ?Lyda Jester, PA ?Jessica Milford,NP ?Marlyce Huge, PA ?Audry Riles, PharmD ? ? ?Please be sure to bring in all your medications bottles to every appointment.  ? ? ? ?

## 2021-05-08 NOTE — Progress Notes (Signed)
Medication Samples have been provided to the patient. ? ?Drug name: FARXGIA       Strength: 10MG        Qty: 4 BOXES  LOT: [id number]  Exp.Date: 11-24-23 ? ?Dosing instructions: TAKE 1 TAB DAILY ? ?Medication Samples have been provided to the patient. ? ?Drug name: Corlanor      Strength: 56m        Qty: 4 bottles  LOT: [id number] Exp.Date: 08/26 ? ?Dosing instructions: take 1 tablet Twice daily ? ? ?The patient has been instructed regarding the correct time, dose, and frequency of taking this medication, including desired effects and most common side effects.  ? ?Kayce Betty M Denorris Reust ?3:56 PM ?05/08/2021 ? ? ? ? ?

## 2021-05-15 ENCOUNTER — Other Ambulatory Visit (HOSPITAL_COMMUNITY): Payer: Self-pay

## 2021-05-15 ENCOUNTER — Other Ambulatory Visit: Payer: Self-pay | Admitting: Adult Health

## 2021-05-15 DIAGNOSIS — I480 Paroxysmal atrial fibrillation: Secondary | ICD-10-CM

## 2021-05-15 NOTE — Progress Notes (Signed)
Paramedicine Encounter    Patient ID: Brent [name], male    DOB: 1946-01-09, 76 y.o.   MRN: [id number]   Arrived for home visit for Brent [name] who reports feeling good with no complaints. Assessment and vitals obtained. Meds reviewed and confirmed. Pill box filled and confirmed. Some mild lower right ankle edema noted. Lungs clear. We reviewed appointments and discussed overall HF management. Brent [name].   Refills-  Spironolactone Metoprolol Seroquel Budenoside     Patient Care Team: [name], Donalee Citrin, NP as PCP - General (Family Medicine) [place], Peter M, MD as PCP - Cardiology (Cardiology) Laurey Morale, MD as PCP - Advanced Heart Failure (Cardiology) Zetta Bills, MD (Nephrology) Rachael Fee, MD (Gastroenterology) Burna Sis, LCSW as Social Worker (Licensed Clinical Social Worker)  Patient Active Problem List   Diagnosis Date Noted   Acute kidney injury superimposed on chronic kidney disease (HCC) 02/25/2021   Generalized weakness 02/25/2021   SIRS (systemic inflammatory response syndrome) (HCC) 10/31/2020   Neurocognitive deficits 10/17/2020   Hypokalemia 10/08/2020   Hypomagnesemia 10/08/2020   CKD (chronic kidney disease), stage III (HCC) 10/08/2020   COVID-19 virus infection 10/08/2020   Electrolyte abnormality 05/17/2020   ICD (implantable cardioverter-defibrillator) in place 04/12/2020   Chronic systolic heart failure (HCC) 01/02/2020   Decreased appetite 10/08/2019   Weakness 10/08/2019   Hyperkalemia 08/26/2019   Chronic systolic CHF (congestive heart failure) (HCC) 08/26/2019   Type 2 diabetes mellitus with stage 3 chronic kidney disease (HCC) 08/26/2019   Chronic obstructive pulmonary disease (HCC) 06/20/2019   Ventricular tachycardia 06/20/2019   Acute on chronic systolic (congestive) heart failure (HCC) 04/25/2019   Metabolic acidosis 09/07/2018   Orthostasis 09/07/2018   AKI (acute kidney injury) (HCC)    Immunosuppressed status (HCC)    Macrocytic  anemia    Chronic combined systolic and diastolic congestive heart failure (HCC) 06/03/2018   Candida esophagitis (HCC) 09/22/2017   History of smoking 30 or more pack years 01/05/2017   Bipolar 1 disorder (HCC)    BPH (benign prostatic hyperplasia) 03/31/2013   Anemia in chronic kidney disease 03/31/2013   Essential hypertension, benign 03/31/2013   Acute renal failure superimposed on stage 3 chronic kidney disease (HCC) 06/04/2012   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HCC) 01/23/2010    Current Outpatient Medications:    acetaminophen (TYLENOL) 325 MG tablet, Take 650 mg by mouth every 6 (six) hours as needed., Disp: , Rfl:    allopurinol (ZYLOPRIM) 100 MG tablet, TAKE 2 TABLETS(200 MG) BY MOUTH DAILY, Disp: 60 tablet, Rfl: 3   apixaban (ELIQUIS) 5 MG TABS tablet, Take 1 tablet (5 mg total) by mouth 2 (two) times daily., Disp: 60 tablet, Rfl: 0   budesonide (ENTOCORT EC) 3 MG 24 hr capsule, Take 3 capsules (9 mg total) by mouth daily., Disp: 90 capsule, Rfl: 0   Cholecalciferol (VITAMIN D) 125 MCG (5000 UT) CAPS, Take 5,000 Units by mouth daily., Disp: 30 capsule, Rfl: 0   dapagliflozin propanediol (FARXIGA) 10 MG TABS tablet, Take 1 tablet (10 mg total) by mouth daily before breakfast., Disp: 30 tablet, Rfl: 11   divalproex (DEPAKOTE ER) 500 MG 24 hr tablet, TAKE 3 TABLETS(1500 MG) BY MOUTH AT BEDTIME, Disp: 90 tablet, Rfl: 0   epoetin alfa (EPOGEN) 10000 UNIT/ML injection, 10,000 Units every 30 (thirty) days., Disp: , Rfl:    Ferrous Sulfate Dried (FEOSOL) 200 (65 Fe) MG TABS, Take 1 tablet by mouth daily., Disp: , Rfl:    furosemide (LASIX) 20 MG  tablet, Monday, Wednesday, and Friday. Take extra 20 mg tablet ( total 40 mg) x 3 days then resume regular dose on Mon,wed- Fri, Disp: 30 tablet, Rfl: 5   inFLIXimab (REMICADE) 100 MG injection, Infuse Remicade IV schedule 1 5mg /kg every 8 weeks Premedicate with Tylenol 500-650mg  by mouth and Benadryl 25-50mg  by mouth prior to infusion. Last PPD was on  12/2009. , Disp: 1 each, Rfl: 6   isosorbide mononitrate (IMDUR) 60 MG 24 hr tablet, Take 1 tablet (60 mg total) by mouth daily., Disp: 90 tablet, Rfl: 3   ivabradine (CORLANOR) 5 MG TABS tablet, TAKE 1 TABLET(5 MG) BY MOUTH TWICE DAILY WITH A MEAL, Disp: 180 tablet, Rfl: 3   loperamide (IMODIUM) 2 MG capsule, Take 1 capsule (2 mg total) by mouth 3 (three) times daily as needed for diarrhea or loose stools., Disp: 30 capsule, Rfl: 0   magnesium oxide (MAG-OX) 400 (240 Mg) MG tablet, TAKE 1 TABLET BY MOUTH IN THE MORNING, AT NOON, AND AT BEDTIME, Disp: 90 tablet, Rfl: 5   metoprolol succinate (TOPROL-XL) 25 MG 24 hr tablet, Take 1.5 tablets (37.5 mg total) by mouth in the morning and at bedtime., Disp: 90 tablet, Rfl: 6   mirtazapine (REMERON) 7.5 MG tablet, TAKE 1 TABLET(7.5 MG) BY MOUTH AT BEDTIME, Disp: 30 tablet, Rfl: 3   Multiple Vitamin (MULTIVITAMIN WITH MINERALS) TABS tablet, Take 1 tablet by mouth daily., Disp: , Rfl:    omeprazole (PRILOSEC OTC) 20 MG tablet, Take 1 tablet (20 mg total) by mouth daily. For GERD, Disp: 30 tablet, Rfl: 0   QUEtiapine (SEROQUEL) 100 MG tablet, Take 1.5 tablets (150 mg total) by mouth at bedtime., Disp: 45 tablet, Rfl: 5   solifenacin (VESICARE) 5 MG tablet, TAKE 1 TABLET(5 MG) BY MOUTH DAILY, Disp: 30 tablet, Rfl: 0   spironolactone (ALDACTONE) 25 MG tablet, Take 0.5 tablets (12.5 mg total) by mouth daily., Disp: 15 tablet, Rfl: 6   tamsulosin (FLOMAX) 0.4 MG CAPS capsule, Take 1 capsule (0.4 mg total) by mouth daily., Disp: 30 capsule, Rfl: 0   vitamin B-12 (CYANOCOBALAMIN) 1000 MCG tablet, Take 1,000 mcg by mouth daily., Disp: , Rfl:  Allergies  Allergen Reactions   Azathioprine Other (See Comments)    REACTION: affected WBC "Almost died"   Ciprofloxacin Other (See Comments)    Reaction not recalled   Levaquin [Levofloxacin In D5w] Other (See Comments)    Reaction not rec   Plendil [Felodipine] Other (See Comments)    Reaction not not recalled      Social History   Socioeconomic History   Marital status: Divorced    Spouse name: Not on file   Number of children: 1   Years of education: 12   Highest education level: Not on file  Occupational History   Occupation: retired   Occupation: Veteran  Tobacco Use   Smoking status: Former    Packs/day: 1.00    Years: 49.00    Pack years: 49.00    Types: Cigarettes    Start date: 04/11/1956    Quit date: 04/17/2014    Years since quitting: 7.0   Smokeless tobacco: Never  Vaping Use   Vaping Use: Never used  Substance and Sexual Activity   Alcohol use: No    Alcohol/week: 0.0 standard drinks   Drug use: No   Sexual activity: Never  Other Topics Concern   Not on file  Social History Narrative   Not on file   Social Determinants of Health  Financial Resource Strain: Not on file  Food Insecurity: No Food Insecurity   Worried About Programme researcher, broadcasting/film/video in the Last Year: Never true   Barista in the Last Year: Never true  Transportation Needs: No Transportation Needs   Lack of Transportation (Medical): No   Lack of Transportation (Non-Medical): No  Physical Activity: Not on file  Stress: Not on file  Social Connections: Not on file  Intimate Partner Violence: Not on file    Physical Exam Vitals reviewed.  Constitutional:      Appearance: Normal appearance. He is normal weight.  HENT:     Head: Normocephalic.     Nose: Nose normal.     Mouth/Throat:     Mouth: Mucous membranes are moist.     Pharynx: Oropharynx is clear.  Eyes:     Conjunctiva/sclera: Conjunctivae normal.     Pupils: Pupils are equal, round, and reactive to light.  Cardiovascular:     Rate and Rhythm: Normal rate and regular rhythm.     Pulses: Normal pulses.     Heart sounds: Normal heart sounds.  Pulmonary:     Effort: Pulmonary effort is normal.     Breath sounds: Normal breath sounds.  Abdominal:     General: Abdomen is flat.     Palpations: Abdomen is soft.   Musculoskeletal:        General: Swelling present. Normal range of motion.     Cervical back: Normal range of motion.     Right lower leg: Edema present.     Left lower leg: No edema.  Skin:    General: Skin is warm and dry.     Capillary Refill: Capillary refill takes less than 2 seconds.  Neurological:     General: No focal deficit present.     Mental Status: He is alert. Mental status is at baseline.  Psychiatric:        Mood and Affect: Mood normal.        Future Appointments  Date Time Provider Department Center  05/16/2021 11:00 AM Alejandro Mulling, RN [facility] None  05/20/2021  7:25 AM [facility] DEVICE REMOTES [facility]  05/24/2021 11:00 AM [facility] RM 2 [facility] None  07/02/2021  7:00 AM [facility] DEVICE REMOTES [facility]  07/02/2021  3:00 PM [facility] PA/NP [facility] None  10/01/2021  7:00 AM [facility] DEVICE REMOTES [facility]  10/07/2021  9:00 AM [facility] LAB [facility] None  10/10/2021 10:00 AM [name], Dinah C, NP [facility] None  12/31/2021  7:00 AM [facility] DEVICE REMOTES [facility]  01/31/2022  3:45 PM [name], Dinah C, NP [facility] None  04/01/2022  7:00 AM [facility] DEVICE REMOTES [facility]  07/01/2022  7:00 AM [facility] DEVICE REMOTES [facility]  09/30/2022  7:00 AM [facility] DEVICE REMOTES [facility]     ACTION: Home visit completed

## 2021-05-16 ENCOUNTER — Ambulatory Visit: Payer: Self-pay | Admitting: *Deleted

## 2021-05-20 ENCOUNTER — Other Ambulatory Visit: Payer: Self-pay | Admitting: Internal Medicine

## 2021-05-20 ENCOUNTER — Ambulatory Visit (INDEPENDENT_AMBULATORY_CARE_PROVIDER_SITE_OTHER): Payer: No Typology Code available for payment source

## 2021-05-20 DIAGNOSIS — I5022 Chronic systolic (congestive) heart failure: Secondary | ICD-10-CM

## 2021-05-20 DIAGNOSIS — Z95810 Presence of automatic (implantable) cardiac defibrillator: Secondary | ICD-10-CM

## 2021-05-22 ENCOUNTER — Other Ambulatory Visit (HOSPITAL_COMMUNITY): Payer: Self-pay

## 2021-05-22 NOTE — Progress Notes (Signed)
EPIC Encounter for ICM Monitoring ? ?Patient Name: Brent [name] is a 76 y.o. male ?Date: 05/22/2021 ?Primary Care Physican: [name], [name], NP ?Primary Cardiologist: [name] ?Electrophysiologist: Lovena Le ?04/02/2021 Weight: 170.2 lbs ?                                                         ?  ?Transmission reviewed.  ?  ?Optivol thoracic impedance suggesting normal fluid levelssssss. ?  ?Prescribed:  ?Furosemide 20 mg take 1 tablet (20 mg total) by mouth every Mon, Wed and Friday. ?Farxiga 10 mg take 1 tablet by mouth daily before breakfast. ?Spironolactone 25 mg take 0.5 tablet (12.5 mg total) daily ?  ?Labs: ?04/12/2021 Creatinine 2.15, BUN 29, Potassium 4.5, Sodium 142 ?04/08/2021 Creatinine 2.24, BUN 43, Potassium 4.2, Sodium 144, GFR 30  ?03/27/2021 Creatinine 2.44, BUN 35, Potassium 3.7, Sodium 138,  ?A complete set of results can be found in Results Review. ?  ?Recommendations:  No changes. ?  ?Follow-up plan: ICM clinic phone appointment on 06/24/2021.   91 day device clinic remote transmission 07/02/2021.   ?  ?EP/Cardiology Office Visits: 07/02/2021 with [facility] NP/PA.   ?  ?Copy of ICM check sent to Dr. Lovena Le.   ?  ?3 month ICM trend: 05/20/2021. ? ? ? ?12-14 Month ICM trend:  ? ? ? ?Rosalene Billings, RN ?05/22/2021 ?2:45 PM ? ?

## 2021-05-22 NOTE — Progress Notes (Signed)
Paramedicine Encounter ? ? ? Patient ID: Brent [name], male    DOB: [birth year], 76 y.o.   MRN: [id number] ? ? ?Arrived for home visit for Brent [name] who was seated in his living room alert and oriented. He reports feeling good with some swelling to both feet. He states he started wearing his compression stockings yesterday but slept with them on because he could not take them off on his own. I assisted him in getting them off this morning. I noted no swelling to legs but noted swelling to both tops of feet. He reports some mild pain in both tops of feet with no recent injury. I reminded him the importance of keeping his feet elevated, removing stockings at night (understanding he lives alone and needs assistance with this) as well as reducing sodium intake and fluid input. He verbalized understanding.  ? ?Vitals and assessment are as noted in report. Weight is down one pound this week.  ? ?I reviewed medications and confirmed same filling pill box for one week.  ? ?Refills: ?Budenoside ? ?I will see Brent [name] in one week, he understands if swelling in tops of feet doesn't improve to call me or his [facility] nurse.  ? ?Patient Care Team: ?[name], [name], NP as PCP - General (Family Medicine) ?[place], Peter M, MD as PCP - Cardiology (Cardiology) ?Larey Dresser, MD as PCP - Advanced Heart Failure (Cardiology) ?Elmarie Shiley, MD (Nephrology) ?Milus Banister, MD (Gastroenterology) ?Jorge Ny, LCSW as Education officer, museum (Licensed Holiday representative) ? ?Patient Active Problem List  ? Diagnosis Date Noted  ? Acute kidney injury superimposed on chronic kidney disease ([place]) 02/25/2021  ? Generalized weakness 02/25/2021  ? SIRS (systemic inflammatory response syndrome) ([place]) 10/31/2020  ? Neurocognitive deficits 10/17/2020  ? Hypokalemia 10/08/2020  ? Hypomagnesemia 10/08/2020  ? CKD (chronic kidney disease), stage III ([place]) 10/08/2020  ? COVID-19 virus infection 10/08/2020  ? Electrolyte abnormality 05/17/2020  ? ICD (implantable  cardioverter-defibrillator) in place 04/12/2020  ? Chronic systolic heart failure ([place]) 01/02/2020  ? Decreased appetite 10/08/2019  ? Weakness 10/08/2019  ? Hyperkalemia 08/26/2019  ? Chronic systolic CHF (congestive heart failure) ([place]) 08/26/2019  ? Type 2 diabetes mellitus with stage 3 chronic kidney disease ([place]) 08/26/2019  ? Chronic obstructive pulmonary disease ([place]) 06/20/2019  ? Ventricular tachycardia 06/20/2019  ? Acute on chronic systolic (congestive) heart failure ([place]) 04/25/2019  ? Metabolic acidosis 62/69/4854  ? Orthostasis 09/07/2018  ? AKI (acute kidney injury) ([place])   ? Immunosuppressed status ([place])   ? Macrocytic anemia   ? Chronic combined systolic and diastolic congestive heart failure ([place]) 06/03/2018  ? Candida esophagitis ([place]) 09/22/2017  ? History of smoking 30 or more pack years 01/05/2017  ? Bipolar 1 disorder ([place])   ? BPH (benign prostatic hyperplasia) 03/31/2013  ? Anemia in chronic kidney disease 03/31/2013  ? Essential hypertension, benign 03/31/2013  ? Acute renal failure superimposed on stage 3 chronic kidney disease ([place]) 06/04/2012  ? 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[place]) 01/23/2010  ? ? ?Current Outpatient Medications:  ?  acetaminophen (TYLENOL) 325 MG tablet, Take 650 mg by mouth every 6 (six) hours as needed., Disp: , Rfl:  ?  allopurinol (ZYLOPRIM) 100 MG tablet, TAKE 2 TABLETS(200 MG) BY MOUTH DAILY, Disp: 60 tablet, Rfl: 3 ?  apixaban (ELIQUIS) 5 MG TABS tablet, Take 1 tablet (5 mg total) by mouth 2 (two) times daily., Disp: 60 tablet, Rfl: 0 ?  budesonide (ENTOCORT EC) 3 MG 24 hr capsule,  Take 3 capsules (9 mg total) by mouth daily., Disp: 90 capsule, Rfl: 0 ?  Cholecalciferol (VITAMIN D) 125 MCG (5000 UT) CAPS, Take 5,000 Units by mouth daily., Disp: 30 capsule, Rfl: 0 ?  dapagliflozin propanediol (FARXIGA) 10 MG TABS tablet, Take 1 tablet (10 mg total) by mouth daily before breakfast., Disp: 30 tablet, Rfl: 11 ?  divalproex (DEPAKOTE ER) 500 MG 24 hr tablet, TAKE 3  TABLETS(1500 MG) BY MOUTH AT BEDTIME, Disp: 90 tablet, Rfl: 0 ?  epoetin alfa (EPOGEN) 10000 UNIT/ML injection, 10,000 Units every 30 (thirty) days., Disp: , Rfl:  ?  Ferrous Sulfate Dried (FEOSOL) 200 (65 Fe) MG TABS, Take 1 tablet by mouth daily., Disp: , Rfl:  ?  furosemide (LASIX) 20 MG tablet, Monday, Wednesday, and Friday. Take extra 20 mg tablet ( total 40 mg) x 3 days then resume regular dose on Mon,wed- Fri, Disp: 30 tablet, Rfl: 5 ?  inFLIXimab (REMICADE) 100 MG injection, Infuse Remicade IV schedule 1 53m/kg every 8 weeks Premedicate with Tylenol 500-6584mby mouth and Benadryl 25-5048my mouth prior to infusion. Last PPD was on 12/2009. , Disp: 1 each, Rfl: 6 ?  isosorbide mononitrate (IMDUR) 60 MG 24 hr tablet, Take 1 tablet (60 mg total) by mouth daily., Disp: 90 tablet, Rfl: 3 ?  ivabradine (CORLANOR) 5 MG TABS tablet, TAKE 1 TABLET(5 MG) BY MOUTH TWICE DAILY WITH A MEAL, Disp: 180 tablet, Rfl: 3 ?  loperamide (IMODIUM) 2 MG capsule, Take 1 capsule (2 mg total) by mouth 3 (three) times daily as needed for diarrhea or loose stools., Disp: 30 capsule, Rfl: 0 ?  magnesium oxide (MAG-OX) 400 (240 Mg) MG tablet, TAKE 1 TABLET BY MOUTH IN THE MORNING, AT NOON, AND AT BEDTIME, Disp: 90 tablet, Rfl: 5 ?  metoprolol succinate (TOPROL-XL) 25 MG 24 hr tablet, TAKE 1 AND 1/2 TABLETS(37.5 MG) BY MOUTH IN THE MORNING AND AT BEDTIME, Disp: 270 tablet, Rfl: 0 ?  mirtazapine (REMERON) 7.5 MG tablet, TAKE 1 TABLET(7.5 MG) BY MOUTH AT BEDTIME, Disp: 30 tablet, Rfl: 3 ?  Multiple Vitamin (MULTIVITAMIN WITH MINERALS) TABS tablet, Take 1 tablet by mouth daily., Disp: , Rfl:  ?  omeprazole (PRILOSEC OTC) 20 MG tablet, Take 1 tablet (20 mg total) by mouth daily. For GERD, Disp: 30 tablet, Rfl: 0 ?  QUEtiapine (SEROQUEL) 100 MG tablet, Take 1.5 tablets (150 mg total) by mouth at bedtime., Disp: 45 tablet, Rfl: 5 ?  solifenacin (VESICARE) 5 MG tablet, TAKE 1 TABLET(5 MG) BY MOUTH DAILY, Disp: 30 tablet, Rfl: 0 ?   spironolactone (ALDACTONE) 25 MG tablet, Take 0.5 tablets (12.5 mg total) by mouth daily., Disp: 15 tablet, Rfl: 6 ?  tamsulosin (FLOMAX) 0.4 MG CAPS capsule, Take 1 capsule (0.4 mg total) by mouth daily., Disp: 30 capsule, Rfl: 0 ?  vitamin B-12 (CYANOCOBALAMIN) 1000 MCG tablet, Take 1,000 mcg by mouth daily., Disp: , Rfl:  ?Allergies  ?Allergen Reactions  ? Azathioprine Other (See Comments)  ?  REACTION: affected WBC "Almost died"  ? Ciprofloxacin Other (See Comments)  ?  Reaction not recalled  ? Levaquin [Levofloxacin In D5w] Other (See Comments)  ?  Reaction not rec  ? Plendil [Felodipine] Other (See Comments)  ?  Reaction not not recalled  ? ? ? ?Social History  ? ?Socioeconomic History  ? Marital status: Divorced  ?  Spouse name: Not on file  ? Number of children: 1  ? Years of education: 12 42 Highest education level:  Not on file  ?Occupational History  ? Occupation: retired  ? Occupation: Veteran  ?Tobacco Use  ? Smoking status: Former  ?  Packs/day: 1.00  ?  Years: 49.00  ?  Pack years: 49.00  ?  Types: Cigarettes  ?  Start date: 04/11/1956  ?  Quit date: 04/17/2014  ?  Years since quitting: 7.1  ? Smokeless tobacco: Never  ?Vaping Use  ? Vaping Use: Never used  ?Substance and Sexual Activity  ? Alcohol use: No  ?  Alcohol/week: 0.0 standard drinks  ? Drug use: No  ? Sexual activity: Never  ?Other Topics Concern  ? Not on file  ?Social History Narrative  ? Not on file  ? ?Social Determinants of Health  ? ?Financial Resource Strain: Not on file  ?Food Insecurity: No Food Insecurity  ? Worried About Charity fundraiser in the Last Year: Never true  ? Ran Out of Food in the Last Year: Never true  ?Transportation Needs: No Transportation Needs  ? Lack of Transportation (Medical): No  ? Lack of Transportation (Non-Medical): No  ?Physical Activity: Not on file  ?Stress: Not on file  ?Social Connections: Not on file  ?Intimate Partner Violence: Not on file  ? ? ?Physical Exam ?Vitals reviewed.  ?Constitutional:   ?    Appearance: Normal appearance. He is normal weight.  ?HENT:  ?   Head: Normocephalic.  ?   Nose: Nose normal.  ?   Mouth/Throat:  ?   Mouth: Mucous membranes are moist.  ?   Pharynx: Oropharynx is clear.  ?Eyes:  ?

## 2021-05-24 ENCOUNTER — Non-Acute Institutional Stay (HOSPITAL_COMMUNITY)
Admission: RE | Admit: 2021-05-24 | Discharge: 2021-05-24 | Disposition: A | Discharge status: Home / Self Care | Payer: No Typology Code available for payment source | Source: Ambulatory Visit | Attending: Internal Medicine | Admitting: Internal Medicine

## 2021-05-24 DIAGNOSIS — N179 Acute kidney failure, unspecified: Secondary | ICD-10-CM | POA: Diagnosis not present

## 2021-05-24 DIAGNOSIS — N189 Chronic kidney disease, unspecified: Secondary | ICD-10-CM | POA: Insufficient documentation

## 2021-05-24 DIAGNOSIS — D631 Anemia in chronic kidney disease: Secondary | ICD-10-CM | POA: Insufficient documentation

## 2021-05-24 LAB — IRON AND TIBC
Iron: 39 ug/dL — ABNORMAL LOW (ref 45–182)
Saturation Ratios: 18 % (ref 17.9–39.5)
TIBC: 220 ug/dL — ABNORMAL LOW (ref 250–450)
UIBC: 181 ug/dL

## 2021-05-24 LAB — HEMOGLOBIN AND HEMATOCRIT, BLOOD
HCT: 35.8 % — ABNORMAL LOW (ref 39.0–52.0)
Hemoglobin: 11.2 g/dL — ABNORMAL LOW (ref 13.0–17.0)

## 2021-05-24 LAB — FERRITIN: Ferritin: 785 ng/mL — ABNORMAL HIGH (ref 24–336)

## 2021-05-24 MED ORDER — EPOETIN ALFA 10000 UNIT/ML IJ SOLN
10000.0000 [IU] | INTRAMUSCULAR | Status: DC
  Administered 2021-05-24: 10000 [IU] via SUBCUTANEOUS
  Filled 2021-05-24: qty 1

## 2021-05-24 MED ORDER — EPOETIN ALFA 10000 UNIT/ML IJ SOLN: 10000.0000 [IU] | INTRAMUSCULAR | Status: DC

## 2021-05-24 NOTE — Progress Notes (Signed)
PATIENT CARE CENTER NOTE ?  ?  ?Diagnosis: Anemia associated with Chronic Renal Failure, anemia associated with renal disease ?  ?  ?Provider: Elmarie Shiley MD ?  ?  ?Procedure: Epoetin Alfa (Procrit) injection ?  ?  ?Note: Patient received 10,000 unit Procrit sub-q injection in right arm. Tolerated well. Labs drawn pre-injection and Hemoglobin was 11.2. Patient's BP elevated but within order parameters at 166/92.  Per patient, he forgot to take BP medications this morning. Discharge instructions given. Patient's next appointment scheduled for 06/21/2021. Patient alert, oriented and ambulatory at discharge. ?

## 2021-05-29 ENCOUNTER — Ambulatory Visit (INDEPENDENT_AMBULATORY_CARE_PROVIDER_SITE_OTHER): Payer: Medicare Other | Admitting: Family

## 2021-05-29 ENCOUNTER — Encounter: Payer: Self-pay | Admitting: Family

## 2021-05-29 ENCOUNTER — Other Ambulatory Visit (HOSPITAL_COMMUNITY): Payer: Self-pay

## 2021-05-29 VITALS — BP 130/70 | HR 81 | Temp 97.5°F | Resp 16 | Ht 76.0 in | Wt 176.0 lb

## 2021-05-29 DIAGNOSIS — I5042 Chronic combined systolic (congestive) and diastolic (congestive) heart failure: Secondary | ICD-10-CM | POA: Diagnosis not present

## 2021-05-29 DIAGNOSIS — R600 Localized edema: Secondary | ICD-10-CM | POA: Diagnosis not present

## 2021-05-29 DIAGNOSIS — R5382 Chronic fatigue, unspecified: Secondary | ICD-10-CM | POA: Diagnosis not present

## 2021-05-29 MED ORDER — FUROSEMIDE 20 MG PO TABS: ORAL_TABLET | ORAL | 5 refills | Status: DC

## 2021-05-29 NOTE — Progress Notes (Signed)
? ?Provider: Marlowe Sax FNP-C ? ?Shreyansh Tiffany, [name], NP ? ?Patient Care Team: ?Edmon Magid, [name], NP as PCP - General (Family Medicine) ?Martinique, Peter M, MD as PCP - Cardiology (Cardiology) ?Larey Dresser, MD as PCP - Advanced Heart Failure (Cardiology) ?Elmarie Shiley, MD (Nephrology) ?Milus Banister, MD (Gastroenterology) ?Jorge Ny, LCSW as Education officer, museum (Licensed Holiday representative) ? ?Extended Emergency Contact Information ?Primary Emergency Contact: [name],[name] ?Mobile Phone: [phone] ?Relation: Other ?Secondary Emergency Contact: Terrilee Files ?Mobile Phone: [phone] ?Relation: None ? ?Code Status: Full Code  ?Goals of care: Advanced Directive information ? ?  05/29/2021  ? 10:19 AM  ?Advanced Directives  ?Does Patient Have a Medical Advance Directive? Yes  ?Type of Paramedic of [place];Living will  ?Does patient want to make changes to medical advance directive? No - Patient declined  ?Copy of [place] in Chart? Yes - validated most recent copy scanned in chart (See row information)  ? ? ? ?Chief Complaint  ?Patient presents with  ? Acute Visit  ?  Patient complains of being fatigued.   ? ? ?HPI:  ?Pt is a 76 y.o. male seen today for an acute visit for evaluation of fatigue.  He is here with [place] who provides additional HPI information.She stated patient has been weak and gets short of breath when she is walking.Unable to stand for prolonged period of time.She was seen yesterday by therapist with noted weight gain.On chart review he has had 2 pound weight gain since last seen 05/22/2021.  Leg edema has also worsened.  Has been eating ham.He denies She denies any fever,chills,cough,body aches,runny nose,chest tightness,chest pain or palpitation.  ? ?Past Medical History:  ?Diagnosis Date  ? Anemia   ? Anxiety   ? Benign paroxysmal positional vertigo   ? Bipolar I disorder, most recent episode (or current) unspecified   ? Celiac  disease   ? CHF (congestive heart failure) ([place])   ? Chronic kidney disease, stage III (moderate) (HCC)   ? Crohn's   ? Remicade q8 weeks  ? Depression   ? Essential and other specified forms of tremor   ? medication-induced Parkinson's, now resolved  ? Essential hypertension, benign   ? Gout 2018  ? Heart failure ([place])   ? Hypertrophy of prostate without urinary obstruction and other lower urinary tract symptoms (LUTS)   ? Impotence of organic origin   ? Insomnia with sleep apnea, unspecified   ? Iron deficiency anemia, unspecified   ? 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 Neuralgia, neuritis, and radiculitis, unspecified   ? Other B-complex deficiencies   ? Other extrapyramidal disease and abnormal movement disorder   ? Postinflammatory pulmonary fibrosis (HCC)   ? Tobacco use disorder   ? Vertigo 2018  ? ?Past Surgical History:  ?Procedure Laterality Date  ? CATARACT EXTRACTION, BILATERAL Bilateral 09/2018  ? CHOLECYSTECTOMY  07-12-2010  ? ICD IMPLANT N/A 01/02/2020  ? Procedure: ICD IMPLANT;  Surgeon: Evans Lance, MD;  Location: [facility];  Service: Cardiovascular;  Laterality: N/A;  ? RIGHT HEART CATH N/A 10/12/2018  ? Procedure: RIGHT HEART CATH;  Surgeon: Jolaine Artist, MD;  Location: [facility];  Service: Cardiovascular;  Laterality: N/A;  ? RIGHT/LEFT HEART CATH AND CORONARY ANGIOGRAPHY N/A 04/26/2019  ? Procedure: RIGHT/LEFT HEART CATH AND CORONARY ANGIOGRAPHY;  Surgeon: Larey Dresser, MD;  Location: [facility];  Service: Cardiovascular;  Laterality: N/A;  ? SMALL INTESTINE SURGERY    ?  x 2  ? ? ?Allergies  ?Allergen Reactions  ? Azathioprine Other (See Comments)  ?  REACTION: affected WBC "Almost died"  ? Ciprofloxacin Other (See Comments)  ?  Reaction not recalled  ? Levaquin [Levofloxacin In D5w] Other (See Comments)  ?  Reaction not rec  ? Plendil [Felodipine] Other (See Comments)  ?  Reaction not not recalled  ? ? ?Outpatient Encounter Medications as of 05/29/2021  ?Medication Sig  ?  acetaminophen (TYLENOL) 325 MG tablet Take 650 mg by mouth every 6 (six) hours as needed.  ? allopurinol (ZYLOPRIM) 100 MG tablet TAKE 2 TABLETS(200 MG) BY MOUTH DAILY  ? budesonide (ENTOCORT EC) 3 MG 24 hr capsule Take 3 capsules (9 mg total) by mouth daily.  ? Cholecalciferol (VITAMIN D) 125 MCG (5000 UT) CAPS Take 5,000 Units by mouth daily.  ? dapagliflozin propanediol (FARXIGA) 10 MG TABS tablet Take 1 tablet (10 mg total) by mouth daily before breakfast.  ? divalproex (DEPAKOTE ER) 500 MG 24 hr tablet TAKE 3 TABLETS(1500 MG) BY MOUTH AT BEDTIME  ? epoetin alfa (EPOGEN) 10000 UNIT/ML injection 10,000 Units every 30 (thirty) days.  ? Ferrous Sulfate Dried (FEOSOL) 200 (65 Fe) MG TABS Take 1 tablet by mouth daily.  ? furosemide (LASIX) 20 MG tablet Take 20 mg by mouth daily.  ? inFLIXimab (REMICADE) 100 MG injection Infuse Remicade IV schedule 1 83m/kg every 8 weeks ?Premedicate with Tylenol 500-6518mby mouth and Benadryl 25-5029my mouth prior to infusion. ?Last PPD was on 12/2009. ?  ? isosorbide mononitrate (IMDUR) 60 MG 24 hr tablet Take 1 tablet (60 mg total) by mouth daily.  ? ivabradine (CORLANOR) 5 MG TABS tablet TAKE 1 TABLET(5 MG) BY MOUTH TWICE DAILY WITH A MEAL  ? loperamide (IMODIUM) 2 MG capsule Take 1 capsule (2 mg total) by mouth 3 (three) times daily as needed for diarrhea or loose stools.  ? magnesium oxide (MAG-OX) 400 (240 Mg) MG tablet TAKE 1 TABLET BY MOUTH IN THE MORNING, AT NOON, AND AT BEDTIME  ? metoprolol succinate (TOPROL-XL) 25 MG 24 hr tablet TAKE 1 AND 1/2 TABLETS(37.5 MG) BY MOUTH IN THE MORNING AND AT BEDTIME  ? mirtazapine (REMERON) 7.5 MG tablet TAKE 1 TABLET(7.5 MG) BY MOUTH AT BEDTIME  ? Multiple Vitamin (MULTIVITAMIN WITH MINERALS) TABS tablet Take 1 tablet by mouth daily.  ? omeprazole (PRILOSEC OTC) 20 MG tablet Take 1 tablet (20 mg total) by mouth daily. For GERD  ? QUEtiapine (SEROQUEL) 100 MG tablet Take 1.5 tablets (150 mg total) by mouth at bedtime.  ? solifenacin  (VESICARE) 5 MG tablet TAKE 1 TABLET(5 MG) BY MOUTH DAILY  ? spironolactone (ALDACTONE) 25 MG tablet Take 0.5 tablets (12.5 mg total) by mouth daily.  ? tamsulosin (FLOMAX) 0.4 MG CAPS capsule Take 1 capsule (0.4 mg total) by mouth daily.  ? vitamin B-12 (CYANOCOBALAMIN) 1000 MCG tablet Take 1,000 mcg by mouth daily.  ? apixaban (ELIQUIS) 5 MG TABS tablet Take 1 tablet (5 mg total) by mouth 2 (two) times daily.  ? [DISCONTINUED] furosemide (LASIX) 20 MG tablet Monday, Wednesday, and Friday. ?Take extra 20 mg tablet ( total 40 mg) x 3 days then resume regular dose on Mon,wed- Fri  ? ?No facility-administered encounter medications on file as of 05/29/2021.  ? ? ?Review of Systems  ?Constitutional:  Positive for fatigue. Negative for appetite change, chills, fever and unexpected weight change.  ?HENT:  Negative for congestion, dental problem, ear discharge, ear pain, facial swelling, hearing  loss, nosebleeds, postnasal drip, rhinorrhea, sinus pressure, sinus pain, sneezing, sore throat, tinnitus and trouble swallowing.   ?Eyes:  Negative for pain, discharge, redness, itching and visual disturbance.  ?Respiratory:  Negative for cough, chest tightness, shortness of breath and wheezing.   ?Cardiovascular:  Negative for chest pain, palpitations and leg swelling.  ?Gastrointestinal:  Negative for abdominal distention, abdominal pain, blood in stool, constipation, diarrhea, nausea and vomiting.  ?Genitourinary:  Negative for difficulty urinating, dysuria, flank pain, frequency and urgency.  ?Musculoskeletal:  Positive for gait problem. Negative for arthralgias, back pain, joint swelling, myalgias, neck pain and neck stiffness.  ?Skin:  Negative for color change, pallor, rash and wound.  ?Neurological:  Negative for dizziness, syncope, speech difficulty, light-headedness, numbness and headaches.  ?     Generalized body weakness   ?Psychiatric/Behavioral:  Negative for agitation, behavioral problems, confusion, hallucinations,  self-injury, sleep disturbance and suicidal ideas. The patient is not nervous/anxious.   ? ?Immunization History  ?Administered Date(s) Administered  ? Fluad Quad(high Dose 65+) 11/15/2018, 11/17/2019, 09/26

## 2021-05-29 NOTE — Progress Notes (Signed)
Paramedicine Encounter ? ? ? Patient ID: JHEREMY BOGER, male    DOB: 1945/06/25, 76 y.o.   MRN: [id number] ? ?Went out to see [name] following his appointment with PCP today. He reports fatigue and lower leg swelling. He admits to eating pork and salty foods. I educated him on sodium intake and reading food labels as well as fluid intake. We also discussed elevating his legs and using his compression hose during the daytime hours and removing them at night. Presently he is waiting for new hose to be delivered as last pair was too small. He verbalized agreement.  ? ?PCP increased Lasix 43m for 3 days and back to 224m I ensured pill box reflected same. All other meds were verified and confirmed.  ? ?I spoke to his HHSlatingtonnd we confirmed changes and she is aware of HF diet and needs and will assist in reinforcing them in the home with him.  ?He was reminded to weigh daily and use weight chart I supplied. ? ?Pill box filled for one week. We discussed upcoming appointments. I will see Mr. HaElnoria Howardn one week. Home visit complete.  ? ? ? ? ? ?ACTION: ?Home visit completed ? ? ? ? ? ? ?

## 2021-05-29 NOTE — Patient Instructions (Signed)
-   cut down on the salt in the diet  ? ?- Keep legs elevated when seated  ? ?- Wear compression stocking on in the Morning and off at bedtime. ? ?- check weight daily and notify provider for any abrupt weight gain > 3 lbs per day  ?

## 2021-05-30 LAB — CBC WITH DIFFERENTIAL/PLATELET
Absolute Monocytes: 824 cells/uL (ref 200–950)
Basophils Absolute: 27 cells/uL (ref 0–200)
Basophils Relative: 0.2 %
Eosinophils Absolute: 95 cells/uL (ref 15–500)
Eosinophils Relative: 0.7 %
HCT: 31.8 % — ABNORMAL LOW (ref 38.5–50.0)
Hemoglobin: 10.4 g/dL — ABNORMAL LOW (ref 13.2–17.1)
Lymphs Abs: 2201 cells/uL (ref 850–3900)
MCH: 34.2 pg — ABNORMAL HIGH (ref 27.0–33.0)
MCHC: 32.7 g/dL (ref 32.0–36.0)
MCV: 104.6 fL — ABNORMAL HIGH (ref 80.0–100.0)
MPV: 11.1 fL (ref 7.5–12.5)
Monocytes Relative: 6.1 %
Neutro Abs: 10355 cells/uL — ABNORMAL HIGH (ref 1500–7800)
Neutrophils Relative %: 76.7 %
Platelets: 142 10*3/uL (ref 140–400)
RBC: 3.04 10*6/uL — ABNORMAL LOW (ref 4.20–5.80)
RDW: 12.9 % (ref 11.0–15.0)
Total Lymphocyte: 16.3 %
WBC: 13.5 10*3/uL — ABNORMAL HIGH (ref 3.8–10.8)

## 2021-05-30 LAB — BASIC METABOLIC PANEL WITH GFR
BUN/Creatinine Ratio: 14 (calc) (ref 6–22)
BUN: 31 mg/dL — ABNORMAL HIGH (ref 7–25)
CO2: 26 mmol/L (ref 20–32)
Calcium: 8.7 mg/dL (ref 8.6–10.3)
Chloride: 108 mmol/L (ref 98–110)
Creat: 2.21 mg/dL — ABNORMAL HIGH (ref 0.70–1.28)
Glucose, Bld: 101 mg/dL — ABNORMAL HIGH (ref 65–99)
Potassium: 2.9 mmol/L — ABNORMAL LOW (ref 3.5–5.3)
Sodium: 143 mmol/L (ref 135–146)
eGFR: 30 mL/min/{1.73_m2} — ABNORMAL LOW (ref >=60)

## 2021-05-30 MED FILL — Epoetin Alfa Inj 10000 Unit/ML: INTRAMUSCULAR | Qty: 1 | Status: AC

## 2021-05-31 ENCOUNTER — Telehealth: Payer: Self-pay | Admitting: Nurse Practitioner

## 2021-05-31 DIAGNOSIS — E876 Hypokalemia: Secondary | ICD-10-CM

## 2021-05-31 MED ORDER — POTASSIUM CHLORIDE CRYS ER 20 MEQ PO TBCR: EXTENDED_RELEASE_TABLET | ORAL | 0 refills | Status: DC

## 2021-05-31 NOTE — Telephone Encounter (Signed)
Care given called to note mychart potassium of 2.9 and wanted this addressed. I see that Dinah, NP had resulted but orders not placed- place orders for potassium and labs.  ? ?No N/V/D. No cough or congestion. No pain with urination.  ? ?Reports chills last night but no fever.  ? ?Rx for potassium 20 meq sent to pharmacy to take 2 tablets today and tomorrow and recheck BMP on Monday 4/10 ? ?Please schedule lab appt around 10 am- pt and caregiver plan to come at this time to get labs.  ?

## 2021-06-03 ENCOUNTER — Other Ambulatory Visit: Payer: Self-pay

## 2021-06-03 ENCOUNTER — Other Ambulatory Visit: Payer: Medicare Other

## 2021-06-03 DIAGNOSIS — E1122 Type 2 diabetes mellitus with diabetic chronic kidney disease: Secondary | ICD-10-CM | POA: Diagnosis not present

## 2021-06-03 DIAGNOSIS — E876 Hypokalemia: Secondary | ICD-10-CM

## 2021-06-03 DIAGNOSIS — I10 Essential (primary) hypertension: Secondary | ICD-10-CM | POA: Diagnosis not present

## 2021-06-03 DIAGNOSIS — D539 Nutritional anemia, unspecified: Secondary | ICD-10-CM | POA: Diagnosis not present

## 2021-06-03 DIAGNOSIS — I5042 Chronic combined systolic (congestive) and diastolic (congestive) heart failure: Secondary | ICD-10-CM | POA: Diagnosis not present

## 2021-06-03 DIAGNOSIS — I480 Paroxysmal atrial fibrillation: Secondary | ICD-10-CM | POA: Diagnosis not present

## 2021-06-04 ENCOUNTER — Telehealth (INDEPENDENT_AMBULATORY_CARE_PROVIDER_SITE_OTHER): Payer: Medicare Other | Admitting: Family

## 2021-06-04 ENCOUNTER — Encounter: Payer: Self-pay | Admitting: Family

## 2021-06-04 DIAGNOSIS — E876 Hypokalemia: Secondary | ICD-10-CM

## 2021-06-04 DIAGNOSIS — D539 Nutritional anemia, unspecified: Secondary | ICD-10-CM | POA: Diagnosis not present

## 2021-06-04 LAB — COMPLETE METABOLIC PANEL WITH GFR
AG Ratio: 0.9 (calc) — ABNORMAL LOW (ref 1.0–2.5)
ALT: 10 U/L (ref 9–46)
AST: 14 U/L (ref 10–35)
Albumin: 2.9 g/dL — ABNORMAL LOW (ref 3.6–5.1)
Alkaline phosphatase (APISO): 53 U/L (ref 35–144)
BUN/Creatinine Ratio: 14 (calc) (ref 6–22)
BUN: 32 mg/dL — ABNORMAL HIGH (ref 7–25)
CO2: 28 mmol/L (ref 20–32)
Calcium: 8.2 mg/dL — ABNORMAL LOW (ref 8.6–10.3)
Chloride: 110 mmol/L (ref 98–110)
Creat: 2.25 mg/dL — ABNORMAL HIGH (ref 0.70–1.28)
Globulin: 3.4 g/dL (calc) (ref 1.9–3.7)
Glucose, Bld: 95 mg/dL (ref 65–99)
Potassium: 3.2 mmol/L — ABNORMAL LOW (ref 3.5–5.3)
Sodium: 145 mmol/L (ref 135–146)
Total Bilirubin: 0.3 mg/dL (ref 0.2–1.2)
Total Protein: 6.3 g/dL (ref 6.1–8.1)
eGFR: 29 mL/min/{1.73_m2} — ABNORMAL LOW (ref >=60)

## 2021-06-04 LAB — CBC WITH DIFFERENTIAL/PLATELET
Absolute Monocytes: 957 cells/uL — ABNORMAL HIGH (ref 200–950)
Basophils Absolute: 31 cells/uL (ref 0–200)
Basophils Relative: 0.3 %
Eosinophils Absolute: 218 cells/uL (ref 15–500)
Eosinophils Relative: 2.1 %
HCT: 31.2 % — ABNORMAL LOW (ref 38.5–50.0)
Hemoglobin: 10.4 g/dL — ABNORMAL LOW (ref 13.2–17.1)
Lymphs Abs: 1945 cells/uL (ref 850–3900)
MCH: 34.6 pg — ABNORMAL HIGH (ref 27.0–33.0)
MCHC: 33.3 g/dL (ref 32.0–36.0)
MCV: 103.7 fL — ABNORMAL HIGH (ref 80.0–100.0)
MPV: 11.4 fL (ref 7.5–12.5)
Monocytes Relative: 9.2 %
Neutro Abs: 7249 cells/uL (ref 1500–7800)
Neutrophils Relative %: 69.7 %
Platelets: 141 10*3/uL (ref 140–400)
RBC: 3.01 10*6/uL — ABNORMAL LOW (ref 4.20–5.80)
RDW: 13 % (ref 11.0–15.0)
Total Lymphocyte: 18.7 %
WBC: 10.4 10*3/uL (ref 3.8–10.8)

## 2021-06-04 LAB — LIPID PANEL
Cholesterol: 126 mg/dL (ref <200)
HDL: 49 mg/dL (ref >=40)
LDL Cholesterol (Calc): 58 mg/dL (calc)
Non-HDL Cholesterol (Calc): 77 mg/dL (calc) (ref <130)
Total CHOL/HDL Ratio: 2.6 (calc) (ref <5.0)
Triglycerides: 103 mg/dL (ref <150)

## 2021-06-04 LAB — MAGNESIUM: Magnesium: 1.3 mg/dL — ABNORMAL LOW (ref 1.5–2.5)

## 2021-06-04 LAB — HEMOGLOBIN A1C
Hgb A1c MFr Bld: 4.8 % of total Hgb (ref <5.7)
Mean Plasma Glucose: 91 mg/dL
eAG (mmol/L): 5 mmol/L

## 2021-06-04 LAB — TSH: TSH: 1.74 mIU/L (ref 0.40–4.50)

## 2021-06-04 NOTE — Progress Notes (Signed)
?This service is provided via telemedicine ? ?No vital signs collected/recorded due to the encounter was a telemedicine visit.  ? ?Location of patient (ex: home, work):  Home. ? ?Patient consents to a telephone visit:  Yes ? ?Location of the provider (ex: office, home):  [employer]. ? ?Name of any referring provider:  Jurline Folger, [name], NP  ? ?Names of all persons participating in the telemedicine service and their role in the encounter:  Patient, Brent [name], [place], [place], [place], NP.   ? ?Time spent on call: 8 minutes spent on the phone with Medical Assistant.   ? ? ? ?Provider: Marlowe Sax FNP-C ? ?Gibson Telleria, [name], NP ? ?Patient Care Team: ?Leelah Hanna, [name], NP as PCP - General (Family Medicine) ?Martinique, Peter M, MD as PCP - Cardiology (Cardiology) ?Larey Dresser, MD as PCP - Advanced Heart Failure (Cardiology) ?Elmarie Shiley, MD (Nephrology) ?Milus Banister, MD (Gastroenterology) ?Jorge Ny, LCSW as Education officer, museum (Licensed Holiday representative) ? ?Extended Emergency Contact Information ?Primary Emergency Contact: Brent [name] ?Mobile Phone: [phone] ?Relation: Other ?Secondary Emergency Contact: Terrilee Files ?Mobile Phone: [phone] ?Relation: None ? ?Code Status:  Full Code  ?Goals of care: Advanced Directive information ? ?  06/04/2021  ?  2:24 PM  ?Advanced Directives  ?Does Patient Have a Medical Advance Directive? Yes  ?Type of Paramedic of [place];Living will  ?Does patient want to make changes to medical advance directive? No - Patient declined  ?Copy of [place] in Chart? Yes - validated most recent copy scanned in chart (See row information)  ? ? ? ?Chief Complaint  ?Patient presents with  ? Acute Visit  ?  Discuss lab results.   ? ? ?HPI:  ?Pt is a 76 y.o. male seen today for an acute visit to discuss lab results. ?-Hemoglobin level is low but stable 10.4 compared to previous was 10.4.   ?Continue with ferrous  sulfate daily. ? ?His potassium level also still low but has improved.he is currently taking potassium 40 mEq twice daily for 2 days then resume potassium 20 mEq daily. ? ? Kidney function is slightly higher 2.25 than the previous level was 2.21 suspect possible due to recent adjustment of furosemide.  We will recheck BMP in 2 weeks. ?  ?-Magnesium level is slightly low 1.3.he is not sure whether taking magnesium 3 times daily.I called and spoke with patient's care giver at Telephone # [place] states patient taking magnesium but had not been taking his potassium.  ?He denies any muscle cramps. ? ? if taking as directed recommend taking extra dose of magnesium 300 mg by mouth tonight at 9 PM. ? ? ?Past Medical History:  ?Diagnosis Date  ? Anemia   ? Anxiety   ? Benign paroxysmal positional vertigo   ? Bipolar I disorder, most recent episode (or current) unspecified   ? Celiac disease   ? CHF (congestive heart failure) ([place])   ? Chronic kidney disease, stage III (moderate) (HCC)   ? Crohn's   ? Remicade q8 weeks  ? Depression   ? Essential and other specified forms of tremor   ? medication-induced Parkinson's, now resolved  ? Essential hypertension, benign   ? Gout 2018  ? Heart failure ([place])   ? Hypertrophy of prostate without urinary obstruction and other lower urinary tract symptoms (LUTS)   ? Impotence of organic origin   ? Insomnia with sleep apnea, unspecified   ? Iron deficiency anemia,  unspecified   ? 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 Neuralgia, neuritis, and radiculitis, unspecified   ? Other B-complex deficiencies   ? Other extrapyramidal disease and abnormal movement disorder   ? Postinflammatory pulmonary fibrosis (HCC)   ? Tobacco use disorder   ? Vertigo 2018  ? ?Past Surgical History:  ?Procedure Laterality Date  ? CATARACT EXTRACTION, BILATERAL Bilateral 09/2018  ? CHOLECYSTECTOMY  07-12-2010  ? ICD IMPLANT N/A 01/02/2020  ? Procedure: ICD IMPLANT;  Surgeon: Evans Lance, MD;  Location: [facility];  Service: Cardiovascular;  Laterality: N/A;  ? RIGHT HEART CATH N/A 10/12/2018  ? Procedure: RIGHT HEART CATH;  Surgeon: Jolaine Artist, MD;  Location: [facility];  Service: Cardiovascular;  Laterality: N/A;  ? RIGHT/LEFT HEART CATH AND CORONARY ANGIOGRAPHY N/A 04/26/2019  ? Procedure: RIGHT/LEFT HEART CATH AND CORONARY ANGIOGRAPHY;  Surgeon: Larey Dresser, MD;  Location: [facility];  Service: Cardiovascular;  Laterality: N/A;  ? SMALL INTESTINE SURGERY    ? x 2  ? ? ?Allergies  ?Allergen Reactions  ? Azathioprine Other (See Comments)  ?  REACTION: affected WBC "Almost died"  ? Ciprofloxacin Other (See Comments)  ?  Reaction not recalled  ? Levaquin [Levofloxacin In D5w] Other (See Comments)  ?  Reaction not rec  ? Plendil [Felodipine] Other (See Comments)  ?  Reaction not not recalled  ? ? ?Outpatient Encounter Medications as of 06/04/2021  ?Medication Sig  ? acetaminophen (TYLENOL) 325 MG tablet Take 650 mg by mouth every 6 (six) hours as needed.  ? allopurinol (ZYLOPRIM) 100 MG tablet TAKE 2 TABLETS(200 MG) BY MOUTH DAILY  ? apixaban (ELIQUIS) 5 MG TABS tablet Take 1 tablet (5 mg total) by mouth 2 (two) times daily.  ? budesonide (ENTOCORT EC) 3 MG 24 hr capsule Take 3 capsules (9 mg total) by mouth daily.  ? Cholecalciferol (VITAMIN D) 125 MCG (5000 UT) CAPS Take 5,000 Units by mouth daily.  ? dapagliflozin propanediol (FARXIGA) 10 MG TABS tablet Take 1 tablet (10 mg total) by mouth daily before breakfast.  ? divalproex (DEPAKOTE ER) 500 MG 24 hr tablet TAKE 3 TABLETS(1500 MG) BY MOUTH AT BEDTIME  ? epoetin alfa (EPOGEN) 10000 UNIT/ML injection 10,000 Units every 30 (thirty) days.  ? Ferrous Sulfate Dried (FEOSOL) 200 (65 Fe) MG TABS Take 1 tablet by mouth daily.  ? furosemide (LASIX) 20 MG tablet Take 2 tablet ( 40 mg tablet ) daily x 3 days then resume 20 mg tablet daily  ? inFLIXimab (REMICADE) 100 MG injection Infuse Remicade IV schedule 1 36m/kg every 8 weeks ?Premedicate with  Tylenol 500-6533mby mouth and Benadryl 25-5068my mouth prior to infusion. ?Last PPD was on 12/2009. ?  ? isosorbide mononitrate (IMDUR) 60 MG 24 hr tablet Take 1 tablet (60 mg total) by mouth daily.  ? ivabradine (CORLANOR) 5 MG TABS tablet TAKE 1 TABLET(5 MG) BY MOUTH TWICE DAILY WITH A MEAL  ? loperamide (IMODIUM) 2 MG capsule Take 1 capsule (2 mg total) by mouth 3 (three) times daily as needed for diarrhea or loose stools.  ? magnesium oxide (MAG-OX) 400 (240 Mg) MG tablet TAKE 1 TABLET BY MOUTH IN THE MORNING, AT NOON, AND AT BEDTIME  ? metoprolol succinate (TOPROL-XL) 25 MG 24 hr tablet TAKE 1 AND 1/2 TABLETS(37.5 MG) BY MOUTH IN THE MORNING AND AT BEDTIME  ? mirtazapine (REMERON) 7.5 MG tablet TAKE 1 TABLET(7.5 MG) BY MOUTH AT BEDTIME  ? Multiple Vitamin (MULTIVITAMIN  WITH MINERALS) TABS tablet Take 1 tablet by mouth daily.  ? omeprazole (PRILOSEC OTC) 20 MG tablet Take 1 tablet (20 mg total) by mouth daily. For GERD  ? potassium chloride SA (KLOR-CON M) 20 MEQ tablet To give 40 meq (2 tablets) today then 40 meq (2 tablet) tomorrow  ? prazosin (MINIPRESS) 2 MG capsule Take 2 mg by mouth at bedtime.  ? QUEtiapine (SEROQUEL) 100 MG tablet Take 1.5 tablets (150 mg total) by mouth at bedtime.  ? solifenacin (VESICARE) 5 MG tablet TAKE 1 TABLET(5 MG) BY MOUTH DAILY  ? spironolactone (ALDACTONE) 25 MG tablet Take 0.5 tablets (12.5 mg total) by mouth daily.  ? tamsulosin (FLOMAX) 0.4 MG CAPS capsule Take 1 capsule (0.4 mg total) by mouth daily.  ? vitamin B-12 (CYANOCOBALAMIN) 1000 MCG tablet Take 1,000 mcg by mouth daily.  ? ?No facility-administered encounter medications on file as of 06/04/2021.  ? ? ?Review of Systems  ?Constitutional:  Negative for appetite change, chills, fatigue, fever and unexpected weight change.  ?Respiratory:  Negative for cough, chest tightness, shortness of breath and wheezing.   ?Cardiovascular:  Positive for leg swelling. Negative for chest pain and palpitations.  ?Gastrointestinal:   Negative for abdominal distention, abdominal pain, nausea and vomiting.  ?Musculoskeletal:  Positive for gait problem. Negative for arthralgias, back pain and joint swelling.  ?Skin:  Negative for color

## 2021-06-05 ENCOUNTER — Telehealth (HOSPITAL_COMMUNITY): Payer: Self-pay

## 2021-06-05 ENCOUNTER — Other Ambulatory Visit (HOSPITAL_COMMUNITY): Payer: Self-pay

## 2021-06-05 NOTE — Progress Notes (Signed)
Paramedicine Encounter ? ? ? Patient ID: Brent [name], male    DOB: [birth year], 76 y.o.   MRN: [id number] ? ?Arrived for home visit for Brent [name] who reports feeling tired today with lower leg swelling. He notes to have bilateral lower leg edema up to mid tib area in the right leg and tops of feet on both legs with mild swelling in left mid tib area. Weight is down 2 lbs from last week and swelling has improved since our last visit. He is taking Lasix 32m daily.  ? ?He is noticeably unsteady on his feet while walking. I had to assist him from the couch to the kitchen area and back which is about 10-15 feet in distance. He shuffles while walking and appears to lean forward as if he is going to fall forward. I made him aware it is for his best interest and safety to use his walker while moving about in the home. He verbalized agreement.  ? ?He reports he is no longer driving as he says " My care taker JCurly Shorestook my keys and said I can't drive".  ? ?I reviewed chart and assessment and vitals as noted. He denied shortness of breath unless he is walking up an incline. He denied chest pain. He says he does get dizzy sometimes. He has been med compliant over the last week.  ?I reviewed meds and filled pill box with changes.  ? ?-Potassium 40 MEQ tonight then starts 20MEQ daily tomorrow morning.  ? ?-He is taking 4057mMagnesium three times daily (morning, noon and bedtime) ? ?(All other meds in Epic list accurate as taking/marked) ? ?His home health nurse/caretaker and I spoke and [facility] [place] [facility]. I advised him to limit his salt intake, fluids and to elevate his legs while sitting as he sits on the couch most of the day with his legs hanging downward with no elevation until bedtime. He agreed with plan.  ? ?Home visit complete. I will see him in one week. I will forward this this encounter to PCP for her follow up.  ? ? ? ? ?Patient Care Team: ?[name], DiNelda Bucks NP as PCP - General (Family Medicine) ?JoMartiniquePeter M, MD as PCP - Cardiology (Cardiology) ?McLarey DresserMD as PCP - Advanced Heart Failure (Cardiology) ?PaElmarie ShileyMD (Nephrology) ?JaMilus BanisterMD (Gastroenterology) ?UrJorge NyLCSW as SoEducation officer, museumLicensed ClHoliday representative? ?Patient Active Problem List  ? Diagnosis Date Noted  ? Acute kidney injury superimposed on chronic kidney disease (HCVernon01/03/2021  ? Generalized weakness 02/25/2021  ? SIRS (systemic inflammatory response syndrome) (HCWinfield09/08/2020  ? Neurocognitive deficits 10/17/2020  ? Hypokalemia 10/08/2020  ? Hypomagnesemia 10/08/2020  ? CKD (chronic kidney disease), stage III (HCMarengo08/15/2022  ? COVID-19 virus infection 10/08/2020  ? Electrolyte abnormality 05/17/2020  ? ICD (implantable cardioverter-defibrillator) in place 04/12/2020  ? Chronic systolic heart failure (HCTylersburg11/09/2019  ? Decreased appetite 10/08/2019  ? Weakness 10/08/2019  ? Hyperkalemia 08/26/2019  ? Chronic systolic CHF (congestive heart failure) (HCNorth Las Vegas07/03/2019  ? Type 2 diabetes mellitus with stage 3 chronic kidney disease (HCLutak07/03/2019  ? Chronic obstructive pulmonary disease (HCCaney City04/26/2021  ? Ventricular tachycardia (HCMansfield04/26/2021  ? Acute on chronic systolic (congestive) heart failure (HCMcCracken03/02/2019  ? Metabolic acidosis 0740/98/1191? Orthostasis 09/07/2018  ? AKI (acute kidney injury) (HCAlexander  ? Immunosuppressed status (HCPend Oreille  ?  Macrocytic anemia   ? Chronic combined systolic and diastolic congestive heart failure ([place]) 06/03/2018  ? Candida esophagitis ([place]) 09/22/2017  ? History of smoking 30 or more pack years 01/05/2017  ? Bipolar 1 disorder ([place])   ? BPH (benign prostatic hyperplasia) 03/31/2013  ? Anemia in chronic kidney disease 03/31/2013  ? Essential hypertension, benign 03/31/2013  ? Acute renal failure superimposed on stage 3 chronic kidney disease ([place]) 06/04/2012  ? 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[place]) 01/23/2010  ? ? ?Current  Outpatient Medications:  ?  acetaminophen (TYLENOL) 325 MG tablet, Take 650 mg by mouth every 6 (six) hours as needed., Disp: , Rfl:  ?  allopurinol (ZYLOPRIM) 100 MG tablet, TAKE 2 TABLETS(200 MG) BY MOUTH DAILY, Disp: 60 tablet, Rfl: 3 ?  apixaban (ELIQUIS) 5 MG TABS tablet, Take 1 tablet (5 mg total) by mouth 2 (two) times daily., Disp: 60 tablet, Rfl: 0 ?  budesonide (ENTOCORT EC) 3 MG 24 hr capsule, Take 3 capsules (9 mg total) by mouth daily., Disp: 90 capsule, Rfl: 0 ?  Cholecalciferol (VITAMIN D) 125 MCG (5000 UT) CAPS, Take 5,000 Units by mouth daily., Disp: 30 capsule, Rfl: 0 ?  dapagliflozin propanediol (FARXIGA) 10 MG TABS tablet, Take 1 tablet (10 mg total) by mouth daily before breakfast., Disp: 30 tablet, Rfl: 11 ?  divalproex (DEPAKOTE ER) 500 MG 24 hr tablet, TAKE 3 TABLETS(1500 MG) BY MOUTH AT BEDTIME, Disp: 90 tablet, Rfl: 0 ?  epoetin alfa (EPOGEN) 10000 UNIT/ML injection, 10,000 Units every 30 (thirty) days., Disp: , Rfl:  ?  Ferrous Sulfate Dried (FEOSOL) 200 (65 Fe) MG TABS, Take 1 tablet by mouth daily., Disp: , Rfl:  ?  furosemide (LASIX) 20 MG tablet, Take 2 tablet ( 40 mg tablet ) daily x 3 days then resume 20 mg tablet daily, Disp: 30 tablet, Rfl: 5 ?  inFLIXimab (REMICADE) 100 MG injection, Infuse Remicade IV schedule 1 45m/kg every 8 weeks Premedicate with Tylenol 500-6533mby mouth and Benadryl 25-5055my mouth prior to infusion. Last PPD was on 12/2009. , Disp: 1 each, Rfl: 6 ?  isosorbide mononitrate (IMDUR) 60 MG 24 hr tablet, Take 1 tablet (60 mg total) by mouth daily., Disp: 90 tablet, Rfl: 3 ?  ivabradine (CORLANOR) 5 MG TABS tablet, TAKE 1 TABLET(5 MG) BY MOUTH TWICE DAILY WITH A MEAL, Disp: 180 tablet, Rfl: 3 ?  loperamide (IMODIUM) 2 MG capsule, Take 1 capsule (2 mg total) by mouth 3 (three) times daily as needed for diarrhea or loose stools., Disp: 30 capsule, Rfl: 0 ?  magnesium oxide (MAG-OX) 400 (240 Mg) MG tablet, TAKE 1 TABLET BY MOUTH IN THE MORNING, AT NOON, AND AT  BEDTIME, Disp: 90 tablet, Rfl: 5 ?  metoprolol succinate (TOPROL-XL) 25 MG 24 hr tablet, TAKE 1 AND 1/2 TABLETS(37.5 MG) BY MOUTH IN THE MORNING AND AT BEDTIME, Disp: 270 tablet, Rfl: 0 ?  mirtazapine (REMERON) 7.5 MG tablet, TAKE 1 TABLET(7.5 MG) BY MOUTH AT BEDTIME, Disp: 30 tablet, Rfl: 3 ?  Multiple Vitamin (MULTIVITAMIN WITH MINERALS) TABS tablet, Take 1 tablet by mouth daily., Disp: , Rfl:  ?  omeprazole (PRILOSEC OTC) 20 MG tablet, Take 1 tablet (20 mg total) by mouth daily. For GERD, Disp: 30 tablet, Rfl: 0 ?  potassium chloride SA (KLOR-CON M) 20 MEQ tablet, To give 40 meq (2 tablets) today then 40 meq (2 tablet) tomorrow, Disp: 30 tablet, Rfl: 0 ?  prazosin (MINIPRESS) 2 MG capsule, Take 2 mg by mouth at bedtime.,  Disp: , Rfl:  ?  QUEtiapine (SEROQUEL) 100 MG tablet, Take 1.5 tablets (150 mg total) by mouth at bedtime., Disp: 45 tablet, Rfl: 5 ?  solifenacin (VESICARE) 5 MG tablet, TAKE 1 TABLET(5 MG) BY MOUTH DAILY, Disp: 30 tablet, Rfl: 0 ?  spironolactone (ALDACTONE) 25 MG tablet, Take 0.5 tablets (12.5 mg total) by mouth daily., Disp: 15 tablet, Rfl: 6 ?  tamsulosin (FLOMAX) 0.4 MG CAPS capsule, Take 1 capsule (0.4 mg total) by mouth daily., Disp: 30 capsule, Rfl: 0 ?  vitamin B-12 (CYANOCOBALAMIN) 1000 MCG tablet, Take 1,000 mcg by mouth daily., Disp: , Rfl:  ?Allergies  ?Allergen Reactions  ? Azathioprine Other (See Comments)  ?  REACTION: affected WBC "Almost died"  ? Ciprofloxacin Other (See Comments)  ?  Reaction not recalled  ? Levaquin [Levofloxacin In D5w] Other (See Comments)  ?  Reaction not rec  ? Plendil [Felodipine] Other (See Comments)  ?  Reaction not not recalled  ? ? ? ?Social History  ? ?Socioeconomic History  ? Marital status: Divorced  ?  Spouse name: Not on file  ? Number of children: 1  ? Years of education: 91  ? Highest education level: Not on file  ?Occupational History  ? Occupation: retired  ? Occupation: Veteran  ?Tobacco Use  ? Smoking status: Former  ?  Packs/day: 1.00  ?   Years: 49.00  ?  Pack years: 49.00  ?  Types: Cigarettes  ?  Start date: 04/11/1956  ?  Quit date: 04/17/2014  ?  Years since quitting: 7.1  ? Smokeless tobacco: Never  ?Vaping Use  ? Vaping Use: Never used  ?Su

## 2021-06-05 NOTE — Telephone Encounter (Signed)
Community Paramedicine Visit forwarded to PCP-  ? ? ? ?Arrived for home visit for [name] who reports feeling tired today with lower leg swelling. He notes to have bilateral lower leg edema up to mid tib area in the right leg and tops of feet on both legs with mild swelling in left mid tib area. Weight is down 2 lbs from last week and swelling has improved since our last visit. He is taking Lasix 55m daily.  ? ?He is noticeably unsteady on his feet while walking. I had to assist him from the couch to the kitchen area and back which is about 10-15 feet in distance. He shuffles while walking and appears to lean forward as if he is going to fall forward. I made him aware it is for his best interest and safety to use his walker while moving about in the home. He verbalized agreement.  ? ?He reports he is no longer driving as he says " My care taker JCurly Shorestook my keys and said I can't drive".  ? ?I reviewed chart and assessment and vitals as noted. He denied shortness of breath unless he is walking up an incline. He denied chest pain. He says he does get dizzy sometimes. He has been med compliant over the last week.  ?I reviewed meds and filled pill box with changes.  ? ?-Potassium 40 MEQ tonight then starts 20MEQ daily tomorrow morning.  ? ?-He is taking 4051mMagnesium three times daily (morning, noon and bedtime) ? ?(All other meds in Epic list accurate as taking/marked) ? ?His home health nurse/caretaker and I spoke and [facility] [place] [facility] [place]. I advised him to limit his salt intake, fluids and to elevate his legs while sitting as he sits on the couch most of the day with his legs hanging downward with no elevation until bedtime. He agreed with plan.  ? ?Home visit complete. I will see him in one week. I will forward this this encounter to PCP for her follow up.  ?

## 2021-06-10 LAB — PSA: PSA: 0.83

## 2021-06-11 ENCOUNTER — Other Ambulatory Visit: Payer: Medicare Other

## 2021-06-11 DIAGNOSIS — E876 Hypokalemia: Secondary | ICD-10-CM | POA: Diagnosis not present

## 2021-06-12 ENCOUNTER — Other Ambulatory Visit (HOSPITAL_COMMUNITY): Payer: Self-pay

## 2021-06-12 ENCOUNTER — Telehealth (HOSPITAL_COMMUNITY): Payer: Self-pay | Admitting: Licensed Clinical Social Worker

## 2021-06-12 ENCOUNTER — Telehealth (HOSPITAL_COMMUNITY): Payer: Self-pay | Admitting: *Deleted

## 2021-06-12 ENCOUNTER — Telehealth (HOSPITAL_COMMUNITY): Payer: Self-pay

## 2021-06-12 DIAGNOSIS — R600 Localized edema: Secondary | ICD-10-CM

## 2021-06-12 DIAGNOSIS — E876 Hypokalemia: Secondary | ICD-10-CM

## 2021-06-12 DIAGNOSIS — I5042 Chronic combined systolic (congestive) and diastolic (congestive) heart failure: Secondary | ICD-10-CM

## 2021-06-12 LAB — BASIC METABOLIC PANEL WITH GFR
BUN/Creatinine Ratio: 17 (calc) (ref 6–22)
BUN: 38 mg/dL — ABNORMAL HIGH (ref 7–25)
CO2: 28 mmol/L (ref 20–32)
Calcium: 8.7 mg/dL (ref 8.6–10.3)
Chloride: 109 mmol/L (ref 98–110)
Creat: 2.27 mg/dL — ABNORMAL HIGH (ref 0.70–1.28)
Glucose, Bld: 85 mg/dL (ref 65–99)
Potassium: 3.6 mmol/L (ref 3.5–5.3)
Sodium: 144 mmol/L (ref 135–146)
eGFR: 29 mL/min/{1.73_m2} — ABNORMAL LOW (ref >=60)

## 2021-06-12 LAB — MAGNESIUM: Magnesium: 1.4 mg/dL — ABNORMAL LOW (ref 1.5–2.5)

## 2021-06-12 MED ORDER — POTASSIUM CHLORIDE CRYS ER 20 MEQ PO TBCR: 20.0000 meq | EXTENDED_RELEASE_TABLET | Freq: Every day | ORAL | 3 refills | Status: DC

## 2021-06-12 MED ORDER — FUROSEMIDE 20 MG PO TABS: 40.0000 mg | ORAL_TABLET | Freq: Every day | ORAL | 5 refills | Status: DC

## 2021-06-12 NOTE — Progress Notes (Signed)
Paramedicine Encounter ? ? ? Patient ID: Brent [name], male    DOB: [birth year], 75 y.o.   MRN: [id number] ? ?Arrived for home visit for Brent [name] who was seated on the couch in the home alert and oriented reporting to be feeling okay but continuing to have lower leg edema with some mild shortness of breath when walking long distances or uphill. He is having a hard time obtaining his new compression stockings from [place]. They should be getting delivered this week per patient.  ? ?Weight is stable today but lower legs both noting swelling with pitting edema. Lungs clear. He was seen by PCP yesterday with labs drawn. I sent message to HF clinic for guidance on increasing Lasix at home and Dr. [name] ordered increase Lasix to 7m daily and increase Potassium to 40MeQ daily.  ? ?I ensured medications were verified and confirmed. Pill box filled accordingly.  ? ?I relayed this information to his HCircle Pineswho expressed concerns for fluid retention as well.  ? ?Mr. Brent Howardreports he is urinating well and trying to cut down on his salt intake. In his home he has lots of cookies, juices and salty snacks. I cautioned him to eat these things and told him he needs to be buying fresh fruits and veggies and meats. He verbalized agreement. I will relay this to [facility] CenterRN also.  ? ?Vitals and assessment as noted.  ? ?[facility]  ? ?I attempted to call [facility]outto discuss the need for [name] and Farxiga refills but no success in reaching a representative. I will see if AHF clinic staff can assist.  ? ?Brent IN PILL BOX: ?[name] Friday thru Weds  ? ? ?Home visit complete I advised Mr. Brent Howardif symptoms worsened to call me. He agreed. I will be back out in one week.  ? ?Refills: ?Budenoside ?Potassium ?Mag OX ?[name]  ? ? ? ? ?Patient Care Team: ?[name], DNelda Bucks NP as PCP - General (Family Medicine) ?JMartinique Peter M, MD as PCP - Cardiology (Cardiology) ?MLarey Dresser MD as PCP - Advanced  Heart Failure (Cardiology) ?PElmarie Shiley MD (Nephrology) ?JMilus Banister MD (Gastroenterology) ?UJorge Ny LCSW as SEducation officer, museum(Licensed CHoliday representative ? ?Patient Active Problem List  ? Diagnosis Date Noted  ? Acute kidney injury superimposed on chronic kidney disease (HPeter 02/25/2021  ? Generalized weakness 02/25/2021  ? SIRS (systemic inflammatory response syndrome) (HSheyenne 10/31/2020  ? Neurocognitive deficits 10/17/2020  ? Hypokalemia 10/08/2020  ? Hypomagnesemia 10/08/2020  ? CKD (chronic kidney disease), stage III (HTexola 10/08/2020  ? COVID-19 virus infection 10/08/2020  ? Electrolyte abnormality 05/17/2020  ? ICD (implantable cardioverter-defibrillator) in place 04/12/2020  ? Chronic systolic heart failure (HKinloch 01/02/2020  ? Decreased appetite 10/08/2019  ? Weakness 10/08/2019  ? Hyperkalemia 08/26/2019  ? Chronic systolic CHF (congestive heart failure) (HCool 08/26/2019  ? Type 2 diabetes mellitus with stage 3 chronic kidney disease (HPenfield 08/26/2019  ? Chronic obstructive pulmonary disease (HLa Monte 06/20/2019  ? Ventricular tachycardia (HEagleville 06/20/2019  ? Acute on chronic systolic (congestive) heart failure (HDiller 04/25/2019  ? Metabolic acidosis 009/62/8366 ? Orthostasis 09/07/2018  ? AKI (acute kidney injury) (HCouncil Grove   ? Immunosuppressed status (HGoldonna   ? Macrocytic anemia   ? Chronic combined systolic and diastolic congestive heart failure (HPowers Lake 06/03/2018  ? Candida esophagitis (HRainelle 09/22/2017  ? History of smoking 30 or more pack years 01/05/2017  ? Bipolar 1 disorder (HVienna   ?  BPH (benign prostatic hyperplasia) 03/31/2013  ? Anemia in chronic kidney disease 03/31/2013  ? Essential hypertension, benign 03/31/2013  ? Acute renal failure superimposed on stage 3 chronic kidney disease ([place]) 06/04/2012  ? 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[place]) 01/23/2010  ? ? ?Current Outpatient Medications:  ?  acetaminophen (TYLENOL) 325 MG tablet, Take 650 mg by mouth every 6 (six) hours as needed., Disp: , Rfl:  ?   allopurinol (ZYLOPRIM) 100 MG tablet, TAKE 2 TABLETS(200 MG) BY MOUTH DAILY, Disp: 60 tablet, Rfl: 3 ?  apixaban (ELIQUIS) 5 MG TABS tablet, Take 1 tablet (5 mg total) by mouth 2 (two) times daily., Disp: 60 tablet, Rfl: 0 ?  budesonide (ENTOCORT EC) 3 MG 24 hr capsule, Take 3 capsules (9 mg total) by mouth daily., Disp: 90 capsule, Rfl: 0 ?  Cholecalciferol (VITAMIN D) 125 MCG (5000 UT) CAPS, Take 5,000 Units by mouth daily., Disp: 30 capsule, Rfl: 0 ?  dapagliflozin propanediol (FARXIGA) 10 MG TABS tablet, Take 1 tablet (10 mg total) by mouth daily before breakfast., Disp: 30 tablet, Rfl: 11 ?  divalproex (DEPAKOTE ER) 500 MG 24 hr tablet, TAKE 3 TABLETS(1500 MG) BY MOUTH AT BEDTIME, Disp: 90 tablet, Rfl: 0 ?  epoetin alfa (EPOGEN) 10000 UNIT/ML injection, 10,000 Units every 30 (thirty) days., Disp: , Rfl:  ?  Ferrous Sulfate Dried (FEOSOL) 200 (65 Fe) MG TABS, Take 1 tablet by mouth daily., Disp: , Rfl:  ?  furosemide (LASIX) 20 MG tablet, Take 2 tablet ( 40 mg tablet ) daily x 3 days then resume 20 mg tablet daily, Disp: 30 tablet, Rfl: 5 ?  inFLIXimab (REMICADE) 100 MG injection, Infuse Remicade IV schedule 1 41m/kg every 8 weeks Premedicate with Tylenol 500-6537mby mouth and Benadryl 25-5057my mouth prior to infusion. Last PPD was on 12/2009. , Disp: 1 each, Rfl: 6 ?  isosorbide mononitrate (IMDUR) 60 MG 24 hr tablet, Take 1 tablet (60 mg total) by mouth daily., Disp: 90 tablet, Rfl: 3 ?  ivabradine ([name]) 5 MG TABS tablet, TAKE 1 TABLET(5 MG) BY MOUTH TWICE DAILY WITH A MEAL, Disp: 180 tablet, Rfl: 3 ?  loperamide (IMODIUM) 2 MG capsule, Take 1 capsule (2 mg total) by mouth 3 (three) times daily as needed for diarrhea or loose stools., Disp: 30 capsule, Rfl: 0 ?  magnesium oxide (MAG-OX) 400 (240 Mg) MG tablet, TAKE 1 TABLET BY MOUTH IN THE MORNING, AT NOON, AND AT BEDTIME, Disp: 90 tablet, Rfl: 5 ?  metoprolol succinate (TOPROL-XL) 25 MG 24 hr tablet, TAKE 1 AND 1/2 TABLETS(37.5 MG) BY MOUTH IN THE  MORNING AND AT BEDTIME, Disp: 270 tablet, Rfl: 0 ?  mirtazapine (REMERON) 7.5 MG tablet, TAKE 1 TABLET(7.5 MG) BY MOUTH AT BEDTIME, Disp: 30 tablet, Rfl: 3 ?  Multiple Vitamin (MULTIVITAMIN WITH MINERALS) TABS tablet, Take 1 tablet by mouth daily., Disp: , Rfl:  ?  omeprazole (PRILOSEC OTC) 20 MG tablet, Take 1 tablet (20 mg total) by mouth daily. For GERD, Disp: 30 tablet, Rfl: 0 ?  potassium chloride SA (KLOR-CON M) 20 MEQ tablet, To give 40 meq (2 tablets) today then 40 meq (2 tablet) tomorrow, Disp: 30 tablet, Rfl: 0 ?  prazosin (MINIPRESS) 2 MG capsule, Take 2 mg by mouth at bedtime., Disp: , Rfl:  ?  QUEtiapine (SEROQUEL) 100 MG tablet, Take 1.5 tablets (150 mg total) by mouth at bedtime., Disp: 45 tablet, Rfl: 5 ?  solifenacin (VESICARE) 5 MG tablet, TAKE 1 TABLET(5 MG) BY MOUTH DAILY, Disp:  30 tablet, Rfl: 0 ?  spironolactone (ALDACTONE) 25 MG tablet, Take 0.5 tablets (12.5 mg total) by mouth daily., Disp: 15 tablet, Rfl: 6 ?  tamsulosin (FLOMAX) 0.4 MG CAPS capsule, Take 1 capsule (0.4 mg total) by mouth daily., Disp: 30 capsule, Rfl: 0 ?  vitamin B-12 (CYANOCOBALAMIN) 1000 MCG tablet, Take 1,000 mcg by mouth daily., Disp: , Rfl:  ?Allergies  ?Allergen Reactions  ? Azathioprine Other (See Comments)  ?  REACTION: affected WBC "Almost died"  ? Ciprofloxacin Other (See Comments)  ?  Reaction not recalled  ? Levaquin [Levofloxacin In D5w] Other (See Comments)  ?  Reaction not rec  ? Plendil [Felodipine] Other (See Comments)  ?  Reaction not not recalled  ? ? ? ?Social History  ? ?Socioeconomic History  ? Marital status: Divorced  ?  Spouse name: Not on file  ? Number of children: 1  ? Years of education: 60  ? Highest education level: Not on file  ?Occupational History  ? Occupation: retired  ? Occupation: Veteran  ?Tobacco Use  ? Smoking status: Former  ?  Packs/day: 1.00  ?  Years: 49.00  ?  Pack years: 49.00  ?  Types: Cigarettes  ?  Start date: 04/11/1956  ?  Quit date: 04/17/2014  ?  Years since quitting:  7.1  ? Smokeless tobacco: Never  ?Vaping Use  ? Vaping Use: Never used  ?Substance and Sexual Activity  ? Alcohol use: No  ?  Alcohol/week: 0.0 standard drinks  ? Drug use: No  ? Sexual activity: Never  ?Oth

## 2021-06-12 NOTE — Telephone Encounter (Signed)
Attempted to reach [facility] to get them to fill [place] and Corlanor as I have previously spoke to a representative here and they advised they had the prescriptions for these and would fill them however the patient has not received them.  ? ?Today I had no success at speaking to a representative and will be forwarding this to our AHF Pharmacy team to see if they have any contacts to assist in talking to someone in the pharmacy at the [facility].  ? ?Samples of [name] provided for now for Brent [name] however there are no Corlanor samples to provide him at this time.  ?

## 2021-06-12 NOTE — Telephone Encounter (Signed)
Heather w/paramedicine called to inform me that pt has a lot of BLEE... pt had labs with pcp yesterday. Per Janett Billow [place],FNP increase lasix to 40 mg daily + extra 20 KCL daily repeat bmet in 2 weeks. ? ?Heather aware  ?

## 2021-06-12 NOTE — Telephone Encounter (Signed)
Paramedic requesting samples because pt has had delay in getting medications from the [place]- pt is now out of corlanor and farxiga ? ?4 boxes of Farxiga samples provided LOT: [id number], Exp 12/25/23 ? ?Clinic does not have any corlanor samples available ? ?Will continue to follow and assist as needed ? ?Jorge Ny, LCSW ?Clinical Social Worker ?Advanced Heart Failure Clinic ?Desk#: [phone] ?Cell#: [phone] ? ?

## 2021-06-13 ENCOUNTER — Telehealth (HOSPITAL_COMMUNITY): Payer: Self-pay | Admitting: *Deleted

## 2021-06-13 DIAGNOSIS — I5022 Chronic systolic (congestive) heart failure: Secondary | ICD-10-CM

## 2021-06-13 MED ORDER — IVABRADINE HCL 5 MG PO TABS: ORAL_TABLET | ORAL | 3 refills | Status: DC

## 2021-06-13 MED ORDER — EMPAGLIFLOZIN 10 MG PO TABS: 10.0000 mg | ORAL_TABLET | Freq: Every day | ORAL | 3 refills | Status: DC

## 2021-06-13 NOTE — Addendum Note (Signed)
Addended by: Scarlette Calico on: 06/13/2021 10:29 AM ? ? Modules accepted: Orders ? ?

## 2021-06-13 NOTE — Telephone Encounter (Signed)
Prescriptions for Jardiance and [employer]	[place], signed, and faxed to [place] at [phone] ?

## 2021-06-13 NOTE — Telephone Encounter (Signed)
Nira Conn is assisting pt's with obtaining medication and pill box and has been having a hard time getting his [place]. Upon review of chart pt was previously getting med from [facility] until 02/23/21. His income is over the limit for that assistance now and the [employer] is not open to assist. [facility] [name] is not on their formulary, Vania Rea is. Ok to change to Jardiance 10 mg Daily per Dr [name]. RX sent to [facility], Nira Conn will ensure it is received, pt does have about a months worth of Farxiga samples to use until he gets the [place]. ?

## 2021-06-13 NOTE — Addendum Note (Signed)
Addended by: Scarlette Calico on: 06/13/2021 10:42 AM ? ? Modules accepted: Orders ? ?

## 2021-06-17 DIAGNOSIS — R350 Frequency of micturition: Secondary | ICD-10-CM | POA: Diagnosis not present

## 2021-06-18 ENCOUNTER — Encounter: Payer: Self-pay | Admitting: *Deleted

## 2021-06-19 ENCOUNTER — Other Ambulatory Visit (HOSPITAL_COMMUNITY): Payer: Self-pay

## 2021-06-19 NOTE — Progress Notes (Signed)
Paramedicine Encounter ? ? ? Patient ID: Brent [name], male    DOB: [birth year], 76 y.o.   MRN: [id number] ? ? ?Arrived for home visit for Brent [name] who was alert and oriented seated on his couch. He reports feeling okay but continuing to have some lower leg swelling. He reports he picked up his compression socks yesterday and wore them for a few hours yesterday.  ? ?Both ankles and tops of feet noted to be swollen. Right is worse than the left. He denied shortness of breath when walking but says if he has to walk a long distance he gets tired and short of breath easy.  ? ?He has been taking his medications as prescribed. I reviewed meds and filled pill box for one week.  ? ?Assessment and vitals obtained. Weight noted to be down this week from last week. He denied any chest pain or dizziness.  ? ?We discussed limiting fluids and reducing his salt intake. He verbalized understanding. He is aware to only wear compression socks during the day and to remove them at night and to keep his legs elevated when he can.  ? ?Appointments reviewed and confirmed.  ? ?Home visit complete. I will see Brent [name] in one week.  ? ?Refills: ?Seroquel  ? ?Patient Care Team: ?[name], [name], NP as PCP - General (Family Medicine) ?Martinique, Peter M, MD as PCP - Cardiology (Cardiology) ?Larey Dresser, MD as PCP - Advanced Heart Failure (Cardiology) ?Elmarie Shiley, MD (Nephrology) ?Milus Banister, MD (Gastroenterology) ?Jorge Ny, LCSW as Education officer, museum (Licensed Holiday representative) ? ?Patient Active Problem List  ? Diagnosis Date Noted  ? Acute kidney injury superimposed on chronic kidney disease ([place]) 02/25/2021  ? Generalized weakness 02/25/2021  ? SIRS (systemic inflammatory response syndrome) ([place]) 10/31/2020  ? Neurocognitive deficits 10/17/2020  ? Hypokalemia 10/08/2020  ? Hypomagnesemia 10/08/2020  ? CKD (chronic kidney disease), stage III ([place]) 10/08/2020  ? COVID-19 virus infection 10/08/2020  ? Electrolyte abnormality  05/17/2020  ? ICD (implantable cardioverter-defibrillator) in place 04/12/2020  ? Chronic systolic heart failure ([place]) 01/02/2020  ? Decreased appetite 10/08/2019  ? Weakness 10/08/2019  ? Hyperkalemia 08/26/2019  ? Chronic systolic CHF (congestive heart failure) ([place]) 08/26/2019  ? Type 2 diabetes mellitus with stage 3 chronic kidney disease ([place]) 08/26/2019  ? Chronic obstructive pulmonary disease ([place]) 06/20/2019  ? Ventricular tachycardia ([place]) 06/20/2019  ? Acute on chronic systolic (congestive) heart failure ([place]) 04/25/2019  ? Metabolic acidosis 62/83/1517  ? Orthostasis 09/07/2018  ? AKI (acute kidney injury) ([place])   ? Immunosuppressed status ([place])   ? Macrocytic anemia   ? Chronic combined systolic and diastolic congestive heart failure ([place]) 06/03/2018  ? Candida esophagitis ([place]) 09/22/2017  ? History of smoking 30 or more pack years 01/05/2017  ? Bipolar 1 disorder ([place])   ? BPH (benign prostatic hyperplasia) 03/31/2013  ? Anemia in chronic kidney disease 03/31/2013  ? Essential hypertension, benign 03/31/2013  ? Acute renal failure superimposed on stage 3 chronic kidney disease ([place]) 06/04/2012  ? 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[place]) 01/23/2010  ? ? ?Current Outpatient Medications:  ?  acetaminophen (TYLENOL) 325 MG tablet, Take 650 mg by mouth every 6 (six) hours as needed., Disp: , Rfl:  ?  allopurinol (ZYLOPRIM) 100 MG tablet, TAKE 2 TABLETS(200 MG) BY MOUTH DAILY, Disp: 60 tablet, Rfl: 3 ?  apixaban (ELIQUIS) 5 MG TABS tablet, Take 1 tablet (5 mg total) by mouth 2 (two) times daily., Disp: 60 tablet, Rfl:  0 ?  budesonide (ENTOCORT EC) 3 MG 24 hr capsule, Take 3 capsules (9 mg total) by mouth daily., Disp: 90 capsule, Rfl: 0 ?  Cholecalciferol (VITAMIN D) 125 MCG (5000 UT) CAPS, Take 5,000 Units by mouth daily., Disp: 30 capsule, Rfl: 0 ?  divalproex (DEPAKOTE ER) 500 MG 24 hr tablet, TAKE 3 TABLETS(1500 MG) BY MOUTH AT BEDTIME, Disp: 90 tablet, Rfl: 0 ?  empagliflozin (JARDIANCE) 10 MG TABS tablet, Take  1 tablet (10 mg total) by mouth daily before breakfast., Disp: 90 tablet, Rfl: 3 ?  epoetin alfa (EPOGEN) 10000 UNIT/ML injection, 10,000 Units every 30 (thirty) days., Disp: , Rfl:  ?  Ferrous Sulfate Dried (FEOSOL) 200 (65 Fe) MG TABS, Take 1 tablet by mouth daily., Disp: , Rfl:  ?  furosemide (LASIX) 20 MG tablet, Take 2 tablets (40 mg total) by mouth daily. Take 2 tablet ( 40 mg tablet ) daily x 3 days then resume 20 mg tablet daily, Disp: 60 tablet, Rfl: 5 ?  inFLIXimab (REMICADE) 100 MG injection, Infuse Remicade IV schedule 1 56m/kg every 8 weeks Premedicate with Tylenol 500-6524mby mouth and Benadryl 25-5028my mouth prior to infusion. Last PPD was on 12/2009. , Disp: 1 each, Rfl: 6 ?  isosorbide mononitrate (IMDUR) 60 MG 24 hr tablet, Take 1 tablet (60 mg total) by mouth daily., Disp: 90 tablet, Rfl: 3 ?  ivabradine (CORLANOR) 5 MG TABS tablet, TAKE 1 TABLET(5 MG) BY MOUTH TWICE DAILY WITH A MEAL, Disp: 180 tablet, Rfl: 3 ?  loperamide (IMODIUM) 2 MG capsule, Take 1 capsule (2 mg total) by mouth 3 (three) times daily as needed for diarrhea or loose stools., Disp: 30 capsule, Rfl: 0 ?  magnesium oxide (MAG-OX) 400 (240 Mg) MG tablet, TAKE 1 TABLET BY MOUTH IN THE MORNING, AT NOON, AND AT BEDTIME, Disp: 90 tablet, Rfl: 5 ?  metoprolol succinate (TOPROL-XL) 25 MG 24 hr tablet, TAKE 1 AND 1/2 TABLETS(37.5 MG) BY MOUTH IN THE MORNING AND AT BEDTIME, Disp: 270 tablet, Rfl: 0 ?  mirtazapine (REMERON) 7.5 MG tablet, TAKE 1 TABLET(7.5 MG) BY MOUTH AT BEDTIME, Disp: 30 tablet, Rfl: 3 ?  Multiple Vitamin (MULTIVITAMIN WITH MINERALS) TABS tablet, Take 1 tablet by mouth daily., Disp: , Rfl:  ?  omeprazole (PRILOSEC OTC) 20 MG tablet, Take 1 tablet (20 mg total) by mouth daily. For GERD, Disp: 30 tablet, Rfl: 0 ?  potassium chloride SA (KLOR-CON M) 20 MEQ tablet, Take 1 tablet (20 mEq total) by mouth daily. To give 40 meq (2 tablets) today then 40 meq (2 tablet) tomorrow, Disp: 30 tablet, Rfl: 3 ?  prazosin (MINIPRESS)  2 MG capsule, Take 2 mg by mouth at bedtime., Disp: , Rfl:  ?  QUEtiapine (SEROQUEL) 100 MG tablet, Take 1.5 tablets (150 mg total) by mouth at bedtime., Disp: 45 tablet, Rfl: 5 ?  solifenacin (VESICARE) 5 MG tablet, TAKE 1 TABLET(5 MG) BY MOUTH DAILY, Disp: 30 tablet, Rfl: 0 ?  spironolactone (ALDACTONE) 25 MG tablet, Take 0.5 tablets (12.5 mg total) by mouth daily., Disp: 15 tablet, Rfl: 6 ?  tamsulosin (FLOMAX) 0.4 MG CAPS capsule, Take 1 capsule (0.4 mg total) by mouth daily., Disp: 30 capsule, Rfl: 0 ?  vitamin B-12 (CYANOCOBALAMIN) 1000 MCG tablet, Take 1,000 mcg by mouth daily., Disp: , Rfl:  ?Allergies  ?Allergen Reactions  ? Azathioprine Other (See Comments)  ?  REACTION: affected WBC "Almost died"  ? Ciprofloxacin Other (See Comments)  ?  Reaction not recalled  ?  Levaquin [Levofloxacin In D5w] Other (See Comments)  ?  Reaction not rec  ? Plendil [Felodipine] Other (See Comments)  ?  Reaction not not recalled  ? ? ? ?Social History  ? ?Socioeconomic History  ? Marital status: Divorced  ?  Spouse name: Not on file  ? Number of children: 1  ? Years of education: 25  ? Highest education level: Not on file  ?Occupational History  ? Occupation: retired  ? Occupation: Veteran  ?Tobacco Use  ? Smoking status: Former  ?  Packs/day: 1.00  ?  Years: 49.00  ?  Pack years: 49.00  ?  Types: Cigarettes  ?  Start date: 04/11/1956  ?  Quit date: 04/17/2014  ?  Years since quitting: 7.1  ? Smokeless tobacco: Never  ?Vaping Use  ? Vaping Use: Never used  ?Substance and Sexual Activity  ? Alcohol use: No  ?  Alcohol/week: 0.0 standard drinks  ? Drug use: No  ? Sexual activity: Never  ?Other Topics Concern  ? Not on file  ?Social History Narrative  ? Not on file  ? ?Social Determinants of Health  ? ?Financial Resource Strain: Not on file  ?Food Insecurity: No Food Insecurity  ? Worried About Charity fundraiser in the Last Year: Never true  ? Ran Out of Food in the Last Year: Never true  ?Transportation Needs: No Transportation  Needs  ? Lack of Transportation (Medical): No  ? Lack of Transportation (Non-Medical): No  ?Physical Activity: Not on file  ?Stress: Not on file  ?Social Connections: Not on file  ?Intimate Partner Violence

## 2021-06-21 ENCOUNTER — Non-Acute Institutional Stay (HOSPITAL_COMMUNITY)
Admission: RE | Admit: 2021-06-21 | Discharge: 2021-06-21 | Disposition: A | Discharge status: Home / Self Care | Payer: No Typology Code available for payment source | Source: Ambulatory Visit | Attending: Internal Medicine | Admitting: Internal Medicine

## 2021-06-21 DIAGNOSIS — N189 Chronic kidney disease, unspecified: Secondary | ICD-10-CM | POA: Insufficient documentation

## 2021-06-21 DIAGNOSIS — D631 Anemia in chronic kidney disease: Secondary | ICD-10-CM | POA: Insufficient documentation

## 2021-06-21 LAB — IRON AND TIBC
Iron: 42 ug/dL — ABNORMAL LOW (ref 45–182)
Saturation Ratios: 20 % (ref 17.9–39.5)
TIBC: 206 ug/dL — ABNORMAL LOW (ref 250–450)
UIBC: 163 ug/dL

## 2021-06-21 LAB — FERRITIN: Ferritin: 655 ng/mL — ABNORMAL HIGH (ref 24–336)

## 2021-06-21 LAB — HEMOGLOBIN AND HEMATOCRIT, BLOOD
HCT: 35.6 % — ABNORMAL LOW (ref 39.0–52.0)
Hemoglobin: 11.1 g/dL — ABNORMAL LOW (ref 13.0–17.0)

## 2021-06-21 MED ORDER — EPOETIN ALFA 10000 UNIT/ML IJ SOLN
10000.0000 [IU] | Freq: Once | INTRAMUSCULAR | Status: AC
  Administered 2021-06-21: 10000 [IU] via SUBCUTANEOUS
  Filled 2021-06-21: qty 1

## 2021-06-21 NOTE — Progress Notes (Signed)
PATIENT CARE CENTER NOTE ?  ?  ?Diagnosis: Anemia associated with Chronic Renal Failure, anemia associated with renal disease ?  ?  ?Provider: Elmarie Shiley MD ?  ?  ?Procedure: Epoetin Alfa (Procrit) injection ?  ?  ?Note: Patient received 10,000 unit Procrit sub-q injection in right arm. Tolerated well. Labs drawn pre-injection and Hemoglobin was 11.1. Patient's BP within order parameters at 114/75. Discharge instructions given. Patient to come back every 4 weeks for injections. Patient alert, oriented and ambulatory at discharge. ?

## 2021-06-24 ENCOUNTER — Ambulatory Visit (INDEPENDENT_AMBULATORY_CARE_PROVIDER_SITE_OTHER): Payer: No Typology Code available for payment source

## 2021-06-24 DIAGNOSIS — I5022 Chronic systolic (congestive) heart failure: Secondary | ICD-10-CM

## 2021-06-24 DIAGNOSIS — Z95810 Presence of automatic (implantable) cardiac defibrillator: Secondary | ICD-10-CM

## 2021-06-26 ENCOUNTER — Other Ambulatory Visit (HOSPITAL_COMMUNITY): Payer: Self-pay

## 2021-06-26 NOTE — Progress Notes (Signed)
Paramedicine Encounter ? ? ? Patient ID: Brent [name], male    DOB: 01/27/1946, 76 y.o.   MRN: [id number] ? ? ?Arrived for home visit for Kendrew who reports feeling tired and having continued swelling in both feet and lower legs. He denied chest pain, dizziness or increased shortness of breath but reports feeling more tired than normal. He reports he has been peeing a lot and reports his urine is normal color and odor.  ? ? ?On assessment lower legs swollen with edema. He is up 5lbs this week. He reports he has been eating fried foods, snacks and fast foods. I advised him the importance of eating a heart healthy, low sodium diet. He verbalized understanding. I asked him if it has been difficult to obtain groceries and he says no because someone does his shopping for him. I asked him if they understand that he needs low sodium foods and he reports yes. I will touch base with Jenna to see if he is eligible for moms meals to assist.  ? ?I obtained vitals and as noted.  ? ?Medications reviewed and confirmed. Pill box filled for one week.  ? ?He still has not received Corlanor or Jardiance from [place] yet. I will make HF clinic aware.  ? ?Refills: ?Potassium ?Eliquis ? ?He reports he is still not driving. He says he is going to be taking [employer] for his upcoming appointments. I suggested [employer] transportation. He agreed with applying for same. I will have [place] assist with same.  ? ?We reviewed upcoming appointments and confirmed same.  ? ?Med compliance and diet reviewed and he verbalized understanding. I will check in with LCSW for Mom's Meals and [employer].  ? ?Home visit complete.  ? ? ? ?Patient Care Team: ?[name], [name], NP as PCP - General (Family Medicine) ?Martinique, Peter M, MD as PCP - Cardiology (Cardiology) ?Larey Dresser, MD as PCP - Advanced Heart Failure (Cardiology) ?Elmarie Shiley, MD (Nephrology) ?Milus Banister, MD (Gastroenterology) ?Jorge Ny, LCSW as Education officer, museum (Licensed Psychologist, forensic) ? ?Patient Active Problem List  ? Diagnosis Date Noted  ? Acute kidney injury superimposed on chronic kidney disease ([place]) 02/25/2021  ? Generalized weakness 02/25/2021  ? SIRS (systemic inflammatory response syndrome) ([place]) 10/31/2020  ? Neurocognitive deficits 10/17/2020  ? Hypokalemia 10/08/2020  ? Hypomagnesemia 10/08/2020  ? CKD (chronic kidney disease), stage III ([place]) 10/08/2020  ? COVID-19 virus infection 10/08/2020  ? Electrolyte abnormality 05/17/2020  ? ICD (implantable cardioverter-defibrillator) in place 04/12/2020  ? Chronic systolic heart failure ([place]) 01/02/2020  ? Decreased appetite 10/08/2019  ? Weakness 10/08/2019  ? Hyperkalemia 08/26/2019  ? Chronic systolic CHF (congestive heart failure) ([place]) 08/26/2019  ? Type 2 diabetes mellitus with stage 3 chronic kidney disease ([place]) 08/26/2019  ? Chronic obstructive pulmonary disease ([place]) 06/20/2019  ? Ventricular tachycardia ([place]) 06/20/2019  ? Acute on chronic systolic (congestive) heart failure ([place]) 04/25/2019  ? Metabolic acidosis 94/70/9628  ? Orthostasis 09/07/2018  ? AKI (acute kidney injury) ([place])   ? Immunosuppressed status ([place])   ? Macrocytic anemia   ? Chronic combined systolic and diastolic congestive heart failure ([place]) 06/03/2018  ? Candida esophagitis ([place]) 09/22/2017  ? History of smoking 30 or more pack years 01/05/2017  ? Bipolar 1 disorder ([place])   ? BPH (benign prostatic hyperplasia) 03/31/2013  ? Anemia in chronic kidney disease 03/31/2013  ? Essential hypertension, benign 03/31/2013  ? Acute renal failure superimposed on stage 3 chronic kidney disease ([place])  06/04/2012  ? 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[place]) 01/23/2010  ? ? ?Current Outpatient Medications:  ?  acetaminophen (TYLENOL) 325 MG tablet, Take 650 mg by mouth every 6 (six) hours as needed., Disp: , Rfl:  ?  allopurinol (ZYLOPRIM) 100 MG tablet, TAKE 2 TABLETS(200 MG) BY MOUTH DAILY, Disp: 60 tablet, Rfl: 3 ?  apixaban (ELIQUIS) 5 MG TABS tablet, Take 1 tablet (5 mg  total) by mouth 2 (two) times daily., Disp: 60 tablet, Rfl: 0 ?  budesonide (ENTOCORT EC) 3 MG 24 hr capsule, Take 3 capsules (9 mg total) by mouth daily., Disp: 90 capsule, Rfl: 0 ?  Cholecalciferol (VITAMIN D) 125 MCG (5000 UT) CAPS, Take 5,000 Units by mouth daily., Disp: 30 capsule, Rfl: 0 ?  divalproex (DEPAKOTE ER) 500 MG 24 hr tablet, TAKE 3 TABLETS(1500 MG) BY MOUTH AT BEDTIME, Disp: 90 tablet, Rfl: 0 ?  empagliflozin (JARDIANCE) 10 MG TABS tablet, Take 1 tablet (10 mg total) by mouth daily before breakfast., Disp: 90 tablet, Rfl: 3 ?  epoetin alfa (EPOGEN) 10000 UNIT/ML injection, 10,000 Units every 30 (thirty) days., Disp: , Rfl:  ?  Ferrous Sulfate Dried (FEOSOL) 200 (65 Fe) MG TABS, Take 1 tablet by mouth daily., Disp: , Rfl:  ?  furosemide (LASIX) 20 MG tablet, Take 2 tablets (40 mg total) by mouth daily. Take 2 tablet ( 40 mg tablet ) daily x 3 days then resume 20 mg tablet daily, Disp: 60 tablet, Rfl: 5 ?  inFLIXimab (REMICADE) 100 MG injection, Infuse Remicade IV schedule 1 76m/kg every 8 weeks Premedicate with Tylenol 500-6559mby mouth and Benadryl 25-5040my mouth prior to infusion. Last PPD was on 12/2009. , Disp: 1 each, Rfl: 6 ?  isosorbide mononitrate (IMDUR) 60 MG 24 hr tablet, Take 1 tablet (60 mg total) by mouth daily., Disp: 90 tablet, Rfl: 3 ?  ivabradine (CORLANOR) 5 MG TABS tablet, TAKE 1 TABLET(5 MG) BY MOUTH TWICE DAILY WITH A MEAL, Disp: 180 tablet, Rfl: 3 ?  loperamide (IMODIUM) 2 MG capsule, Take 1 capsule (2 mg total) by mouth 3 (three) times daily as needed for diarrhea or loose stools., Disp: 30 capsule, Rfl: 0 ?  magnesium oxide (MAG-OX) 400 (240 Mg) MG tablet, TAKE 1 TABLET BY MOUTH IN THE MORNING, AT NOON, AND AT BEDTIME, Disp: 90 tablet, Rfl: 5 ?  metoprolol succinate (TOPROL-XL) 25 MG 24 hr tablet, TAKE 1 AND 1/2 TABLETS(37.5 MG) BY MOUTH IN THE MORNING AND AT BEDTIME, Disp: 270 tablet, Rfl: 0 ?  mirtazapine (REMERON) 7.5 MG tablet, TAKE 1 TABLET(7.5 MG) BY MOUTH AT  BEDTIME, Disp: 30 tablet, Rfl: 3 ?  Multiple Vitamin (MULTIVITAMIN WITH MINERALS) TABS tablet, Take 1 tablet by mouth daily., Disp: , Rfl:  ?  omeprazole (PRILOSEC OTC) 20 MG tablet, Take 1 tablet (20 mg total) by mouth daily. For GERD, Disp: 30 tablet, Rfl: 0 ?  potassium chloride SA (KLOR-CON M) 20 MEQ tablet, Take 1 tablet (20 mEq total) by mouth daily. To give 40 meq (2 tablets) today then 40 meq (2 tablet) tomorrow, Disp: 30 tablet, Rfl: 3 ?  prazosin (MINIPRESS) 2 MG capsule, Take 2 mg by mouth at bedtime., Disp: , Rfl:  ?  QUEtiapine (SEROQUEL) 100 MG tablet, Take 1.5 tablets (150 mg total) by mouth at bedtime., Disp: 45 tablet, Rfl: 5 ?  solifenacin (VESICARE) 5 MG tablet, TAKE 1 TABLET(5 MG) BY MOUTH DAILY, Disp: 30 tablet, Rfl: 0 ?  spironolactone (ALDACTONE) 25 MG tablet, Take 0.5 tablets (12.5  mg total) by mouth daily., Disp: 15 tablet, Rfl: 6 ?  tamsulosin (FLOMAX) 0.4 MG CAPS capsule, Take 1 capsule (0.4 mg total) by mouth daily., Disp: 30 capsule, Rfl: 0 ?  vitamin B-12 (CYANOCOBALAMIN) 1000 MCG tablet, Take 1,000 mcg by mouth daily., Disp: , Rfl:  ?Allergies  ?Allergen Reactions  ? Azathioprine Other (See Comments)  ?  REACTION: affected WBC "Almost died"  ? Ciprofloxacin Other (See Comments)  ?  Reaction not recalled  ? Levaquin [Levofloxacin In D5w] Other (See Comments)  ?  Reaction not rec  ? Plendil [Felodipine] Other (See Comments)  ?  Reaction not not recalled  ? ? ? ?Social History  ? ?Socioeconomic History  ? Marital status: Divorced  ?  Spouse name: Not on file  ? Number of children: 1  ? Years of education: 29  ? Highest education level: Not on file  ?Occupational History  ? Occupation: retired  ? Occupation: Veteran  ?Tobacco Use  ? Smoking status: Former  ?  Packs/day: 1.00  ?  Years: 49.00  ?  Pack years: 49.00  ?  Types: Cigarettes  ?  Start date: 04/11/1956  ?  Quit date: 04/17/2014  ?  Years since quitting: 7.1  ? Smokeless tobacco: Never  ?Vaping Use  ? Vaping Use: Never used   ?Substance and Sexual Activity  ? Alcohol use: No  ?  Alcohol/week: 0.0 standard drinks  ? Drug use: No  ? Sexual activity: Never  ?Other Topics Concern  ? Not on file  ?Social History Narrative  ? Not on file  ? ?Social Determinants

## 2021-06-28 NOTE — Progress Notes (Signed)
EPIC Encounter for ICM Monitoring ? ?Patient Name: Brent [name] is a 76 y.o. male ?Date: 06/28/2021 ?Primary Care Physican: [name], [name], NP ?Primary Cardiologist: [name] ?Electrophysiologist: Lovena Le ?04/02/2021 Weight: 170.2 lbs ?06/28/2021 Weight: 173 lbs ?                                                         ?  ?Spoke with patient and heart failure questions reviewed.  Pt reports swelling of feet and confirmed by EMT paramedic this week during home visit.  He does not follow low salt diet.  Advised to elevate legs to help with edema.  He will discuss with HF clinic on 5/9 OV.  ?  ?Optivol thoracic impedance suggesting normal fluid levels on transmission date 5/1. ?  ?Prescribed:  ?Furosemide 20 mg take 1 tablet (20 mg total) by mouth daily. ?Farxiga 10 mg take 1 tablet by mouth daily before breakfast. ?Spironolactone 25 mg take 0.5 tablet (12.5 mg total) daily ?  ?Labs: ?04/12/2021 Creatinine 2.15, BUN 29, Potassium 4.5, Sodium 142 ?04/08/2021 Creatinine 2.24, BUN 43, Potassium 4.2, Sodium 144, GFR 30  ?03/27/2021 Creatinine 2.44, BUN 35, Potassium 3.7, Sodium 138,  ?A complete set of results can be found in Results Review. ?  ?Recommendations:  Recommendation to limit salt intake to 2000 mg daily and fluid intake to 64 oz daily.  Encouraged to call if experiencing any fluid symptoms.  ?  ?Follow-up plan: ICM clinic phone appointment on 07/29/2021.   91 day device clinic remote transmission 07/02/2021.   ?  ?EP/Cardiology Office Visits: 07/02/2021 with [facility] NP/PA.   ?  ?Copy of ICM check sent to Dr. Lovena Le.   ? ?3 month ICM trend: 06/24/2021. ? ? ? ?12-14 Month ICM trend:  ? ? ? ?Rosalene Billings, RN ?06/28/2021 ?12:22 PM ? ?

## 2021-07-01 NOTE — Progress Notes (Signed)
PCP: [name], [name], NP ?Cardiology: Dr. [name] ? ?Brent [name] is a 76 y.o. with a history of Crohn's disease, celiac disease, HTN, COPD, prior tobacco use, CKD 3, chronic anemia, and bipolar disorder. Diagnosed with systolic HF in 3/23 (echo with EF 30-35%).   ?  ?Admitted 3/11-3/16/20 with SOB and hypoxia. Found to have PNA and required BIPAP and antibiotics. Blood cultures were negative. Echo showed newly reduced EF 30-35%. Cardiology consulted and he had nuclear stress test, which was read as "high risk" due to low EF. Per Dr Sallyanne Kuster, did not think changes were indicative of coronary disease and cath was not pursued due this and due to CKD. HF meds optimized. Limited with CKD3. He had one short run of NSVT. Also had AKI on CKD3, but creatinine improved to baseline 1.77 on day of discharge. ?  ?He was seen in ED 05/12/18 with SOB after misplacing his nebulizer. BNP was elevated, so he was given 40 mg IV lasix and discharged with lasix 20 mg BID. Referred to HF clinic.  ? ?He was admitted again in 7/20 with weakness, orthostatic hypotension, and AKI.  Cardiac meds were held.  He was noted to be severely deconditioned, and he was discharged to SNF.  He remained in SNF x 2 wks and is now home.  ? ?Echo was done in 8/20, EF 20-25% with mild LVH and normal RV.  He had RHC in 8/20 showing normal filling pressures and good cardiac output (surprisingly).  ? ?LHC/RHC in 3/21 showed no significant CAD, low filling pressures, preserved cardiac output. Echo in 9/21 with EF 25-30%.  Medtronic ICD placed in 11/21.  ? ?Patient was admitted in 1/23 with AKI and hyperkalemia. He was gently hydrated and sent home.   ? ?Echo 2/23 EF 20-25%, global HK, normal RV, IVC normal. Farxiga restarted and Lasix changed to every other day. ? ?Today he returns for HF follow up with Nira Conn, with paramedicine. Overall feeling fine. We increased his torsemide recently due to LE swelling and swelling has resolved. He has mild dyspnea walking  around  the house. Denies palpitations, CP, dizziness, edema, or PND/Orthopnea. Appetite ok. No fever or chills. Weight at home 168-170 pounds. Difficulty getting Jardiance and Corlanor from [place], paramedicine helping. Continues to eat high-salt foods. ? ?Device interrogation (personally reviewed): OptiVol down, thoracic impedence stable, 2-3 hrs/day activity, no AF/VT. ? ?Labs (7/20): K 5.1, creatinine 2.25 ?Labs (8/20): K 4.8, creatinine 2.65 ?Labs (9/20): hgb 9.7 ?Labs (12/20): K 4.1, creatinine 1.76 ?Labs (3/21): K 5, creatinine 2.25 ?Labs (6/21): hgb 12 ?Labs (11/21): K 4.3, creatinine 2.62, hgb 12.2 ?Labs (10/22): hgb 10.8, K 4.8, creatinine 2.73 ?Labs (1/23): K 4.3, creatinine 1.8 ?Labs (2/23): K 4.5, creatinine 2.15 ?Labs (4/23): K 3.6, creatinine 2.27 ? ?PMH: ?1. Crohns disease: Gets Remicade.  ?2. Celiac disease.  ?3. COPD: Prior smoker.  ?- CT chest (7/20) with mild emphysema.  ?- PFTs (9/20): Mild obstruction, mild restriction.  ?- High resolution CT chest (10/20): No ILD, +chronic bronchitis.  ?4. CKD: Stage 3.  ?5. HTN ?6. Bipolar disorder ?7. H/o BPPV ?8. Gout ?9. Chronic systolic CHF: Nonischemic cardiomyopathy.  Medtronic ICD.  ?- Echo (3/20): EF 30-35%, mild LVH, normal RV.  ?- Cardiolite (3/20): EF 28%, no ischemia, fixed defects in anteroseptal and inferolateral walls.  ?- Echo (8/20): EF 20-25%, diffuse hypokinesis, mild LVH, normal RV size and systolic function, normal IVC size.  ?- RHC (8/20): mean RA 3, PA 21/10, mean PCWP 5, CI 3.2  Fick and 3.5 Thermo ?- RHC/LHC (3/21): No significant CAD; mean RA 1, PA 20/6, mean PCWP 6, CI 4.57 ?- Echo (9/21): EF 25-30%, normal RV.  ?- Echo (2/23): EF 20-25%, normal RV.  ?10. Atrial fibrillation: Paroxysmal.  ? ?Social History  ? ?Socioeconomic History  ? Marital status: Divorced  ?  Spouse name: Not on file  ? Number of children: 1  ? Years of education: 16  ? Highest education level: Not on file  ?Occupational History  ? Occupation: retired  ? Occupation:  Veteran  ?Tobacco Use  ? Smoking status: Former  ?  Packs/day: 1.00  ?  Years: 49.00  ?  Pack years: 49.00  ?  Types: Cigarettes  ?  Start date: 04/11/1956  ?  Quit date: 04/17/2014  ?  Years since quitting: 7.2  ? Smokeless tobacco: Never  ?Vaping Use  ? Vaping Use: Never used  ?Substance and Sexual Activity  ? Alcohol use: No  ?  Alcohol/week: 0.0 standard drinks  ? Drug use: No  ? Sexual activity: Never  ?Other Topics Concern  ? Not on file  ?Social History Narrative  ? Not on file  ? ?Social Determinants of Health  ? ?Financial Resource Strain: Not on file  ?Food Insecurity: No Food Insecurity  ? Worried About Charity fundraiser in the Last Year: Never true  ? Ran Out of Food in the Last Year: Never true  ?Transportation Needs: No Transportation Needs  ? Lack of Transportation (Medical): No  ? Lack of Transportation (Non-Medical): No  ?Physical Activity: Not on file  ?Stress: Not on file  ?Social Connections: Not on file  ?Intimate Partner Violence: Not on file  ? ?Family History  ?Problem Relation Age of Onset  ? Diabetes Mother   ?     maternal grandmother  ? Uterine cancer Mother   ? Emphysema Father   ? Pneumonia Maternal Grandmother   ? Colon cancer Neg Hx   ? ?ROS: All systems reviewed and negative except as per HPI.  ? ?Current Outpatient Medications  ?Medication Sig Dispense Refill  ? acetaminophen (TYLENOL) 325 MG tablet Take 650 mg by mouth every 6 (six) hours as needed.    ? allopurinol (ZYLOPRIM) 100 MG tablet TAKE 2 TABLETS(200 MG) BY MOUTH DAILY 60 tablet 3  ? apixaban (ELIQUIS) 5 MG TABS tablet Take 1 tablet (5 mg total) by mouth 2 (two) times daily. 60 tablet 0  ? budesonide (ENTOCORT EC) 3 MG 24 hr capsule Take 3 capsules (9 mg total) by mouth daily. 90 capsule 0  ? Cholecalciferol (VITAMIN D) 125 MCG (5000 UT) CAPS Take 5,000 Units by mouth daily. 30 capsule 0  ? divalproex (DEPAKOTE ER) 500 MG 24 hr tablet TAKE 3 TABLETS(1500 MG) BY MOUTH AT BEDTIME 90 tablet 0  ? empagliflozin (JARDIANCE) 25  MG TABS tablet Take 1/2 tablet daily (use 25 mg tablet, per [facility] formulary). 30 tablet 3  ? epoetin alfa (EPOGEN) 10000 UNIT/ML injection 10,000 Units every 30 (thirty) days.    ? Ferrous Sulfate Dried (FEOSOL) 200 (65 Fe) MG TABS Take 1 tablet by mouth daily.    ? furosemide (LASIX) 20 MG tablet Take 2 tablets (40 mg total) by mouth daily. Take 2 tablet ( 40 mg tablet ) daily x 3 days then resume 20 mg tablet daily 60 tablet 5  ? inFLIXimab (REMICADE) 100 MG injection Infuse Remicade IV schedule 1 52m/kg every 8 weeks ?Premedicate with Tylenol 500-6528mby mouth and Benadryl  25-47m by mouth prior to infusion. ?Last PPD was on 12/2009. ? 1 each 6  ? isosorbide mononitrate (IMDUR) 60 MG 24 hr tablet Take 1 tablet (60 mg total) by mouth daily. 90 tablet 3  ? loperamide (IMODIUM) 2 MG capsule Take 1 capsule (2 mg total) by mouth 3 (three) times daily as needed for diarrhea or loose stools. 30 capsule 0  ? magnesium oxide (MAG-OX) 400 (240 Mg) MG tablet TAKE 1 TABLET BY MOUTH IN THE MORNING, AT NOON, AND AT BEDTIME 90 tablet 5  ? metoprolol succinate (TOPROL-XL) 25 MG 24 hr tablet TAKE 1 AND 1/2 TABLETS(37.5 MG) BY MOUTH IN THE MORNING AND AT BEDTIME 270 tablet 0  ? mirtazapine (REMERON) 7.5 MG tablet TAKE 1 TABLET(7.5 MG) BY MOUTH AT BEDTIME 30 tablet 3  ? Multiple Vitamin (MULTIVITAMIN WITH MINERALS) TABS tablet Take 1 tablet by mouth daily.    ? omeprazole (PRILOSEC OTC) 20 MG tablet Take 1 tablet (20 mg total) by mouth daily. For GERD 30 tablet 0  ? potassium chloride SA (KLOR-CON M) 20 MEQ tablet Take 1 tablet (20 mEq total) by mouth daily. To give 40 meq (2 tablets) today then 40 meq (2 tablet) tomorrow 30 tablet 3  ? prazosin (MINIPRESS) 2 MG capsule Take 2 mg by mouth at bedtime.    ? QUEtiapine (SEROQUEL) 100 MG tablet Take 1.5 tablets (150 mg total) by mouth at bedtime. 45 tablet 5  ? solifenacin (VESICARE) 5 MG tablet TAKE 1 TABLET(5 MG) BY MOUTH DAILY 30 tablet 0  ? spironolactone (ALDACTONE) 25 MG tablet  Take 0.5 tablets (12.5 mg total) by mouth daily. 15 tablet 6  ? tamsulosin (FLOMAX) 0.4 MG CAPS capsule Take 1 capsule (0.4 mg total) by mouth daily. 30 capsule 0  ? vitamin B-12 (CYANOCOBALAMIN) 1000 MCG

## 2021-07-02 ENCOUNTER — Other Ambulatory Visit (HOSPITAL_COMMUNITY): Payer: Self-pay

## 2021-07-02 ENCOUNTER — Telehealth (HOSPITAL_COMMUNITY): Payer: Self-pay | Admitting: Pharmacy Technician

## 2021-07-02 ENCOUNTER — Ambulatory Visit (INDEPENDENT_AMBULATORY_CARE_PROVIDER_SITE_OTHER): Payer: Medicare Other

## 2021-07-02 ENCOUNTER — Ambulatory Visit (HOSPITAL_COMMUNITY)
Admission: RE | Admit: 2021-07-02 | Discharge: 2021-07-02 | Disposition: A | Discharge status: Home / Self Care | Payer: Medicare Other | Source: Ambulatory Visit | Attending: Family Medicine | Admitting: Family Medicine

## 2021-07-02 ENCOUNTER — Telehealth (HOSPITAL_COMMUNITY): Payer: Self-pay | Admitting: Cardiology

## 2021-07-02 ENCOUNTER — Encounter (HOSPITAL_COMMUNITY): Payer: Self-pay

## 2021-07-02 VITALS — BP 94/60 | HR 106 | Wt 168.2 lb

## 2021-07-02 DIAGNOSIS — J449 Chronic obstructive pulmonary disease, unspecified: Secondary | ICD-10-CM | POA: Diagnosis not present

## 2021-07-02 DIAGNOSIS — N183 Chronic kidney disease, stage 3 unspecified: Secondary | ICD-10-CM | POA: Insufficient documentation

## 2021-07-02 DIAGNOSIS — K5090 Crohn's disease, unspecified, without complications: Secondary | ICD-10-CM | POA: Diagnosis not present

## 2021-07-02 DIAGNOSIS — Z87891 Personal history of nicotine dependence: Secondary | ICD-10-CM | POA: Insufficient documentation

## 2021-07-02 DIAGNOSIS — I5022 Chronic systolic (congestive) heart failure: Secondary | ICD-10-CM

## 2021-07-02 DIAGNOSIS — D539 Nutritional anemia, unspecified: Secondary | ICD-10-CM | POA: Insufficient documentation

## 2021-07-02 DIAGNOSIS — F319 Bipolar disorder, unspecified: Secondary | ICD-10-CM | POA: Diagnosis not present

## 2021-07-02 DIAGNOSIS — R600 Localized edema: Secondary | ICD-10-CM

## 2021-07-02 DIAGNOSIS — I959 Hypotension, unspecified: Secondary | ICD-10-CM | POA: Insufficient documentation

## 2021-07-02 DIAGNOSIS — K900 Celiac disease: Secondary | ICD-10-CM | POA: Insufficient documentation

## 2021-07-02 DIAGNOSIS — I5042 Chronic combined systolic (congestive) and diastolic (congestive) heart failure: Secondary | ICD-10-CM

## 2021-07-02 DIAGNOSIS — Z79899 Other long term (current) drug therapy: Secondary | ICD-10-CM | POA: Insufficient documentation

## 2021-07-02 DIAGNOSIS — I130 Hypertensive heart and chronic kidney disease with heart failure and stage 1 through stage 4 chronic kidney disease, or unspecified chronic kidney disease: Secondary | ICD-10-CM | POA: Insufficient documentation

## 2021-07-02 DIAGNOSIS — Z7984 Long term (current) use of oral hypoglycemic drugs: Secondary | ICD-10-CM | POA: Insufficient documentation

## 2021-07-02 LAB — CUP PACEART REMOTE DEVICE CHECK
Battery Remaining Longevity: 128 mo
Battery Voltage: 3.01 V
Brady Statistic RV Percent Paced: 0 %
Date Time Interrogation Session: 20230509012403
HighPow Impedance: 71 Ohm
Implantable Lead Implant Date: 20211108
Implantable Lead Location: 753860
Implantable Pulse Generator Implant Date: 20211108
Lead Channel Impedance Value: 304 Ohm
Lead Channel Impedance Value: 418 Ohm
Lead Channel Pacing Threshold Amplitude: 0.5 V
Lead Channel Pacing Threshold Pulse Width: 0.4 ms
Lead Channel Sensing Intrinsic Amplitude: 16.25 mV
Lead Channel Sensing Intrinsic Amplitude: 16.25 mV
Lead Channel Setting Pacing Amplitude: 2 V
Lead Channel Setting Pacing Pulse Width: 0.4 ms
Lead Channel Setting Sensing Sensitivity: 0.3 mV

## 2021-07-02 LAB — BASIC METABOLIC PANEL
Anion gap: 6 (ref 5–15)
BUN: 45 mg/dL — ABNORMAL HIGH (ref 8–23)
CO2: 22 mmol/L (ref 22–32)
Calcium: 8.5 mg/dL — ABNORMAL LOW (ref 8.9–10.3)
Chloride: 109 mmol/L (ref 98–111)
Creatinine, Ser: 3.1 mg/dL — ABNORMAL HIGH (ref 0.61–1.24)
GFR, Estimated: 20 mL/min — ABNORMAL LOW (ref >60)
Glucose, Bld: 106 mg/dL — ABNORMAL HIGH (ref 70–99)
Potassium: 4.1 mmol/L (ref 3.5–5.1)
Sodium: 137 mmol/L (ref 135–145)

## 2021-07-02 MED ORDER — IVABRADINE HCL 5 MG PO TABS: 5.0000 mg | ORAL_TABLET | Freq: Two times a day (BID) | ORAL | 3 refills | Status: AC

## 2021-07-02 MED ORDER — FUROSEMIDE 20 MG PO TABS: ORAL_TABLET | ORAL | 5 refills | Status: AC

## 2021-07-02 MED ORDER — EMPAGLIFLOZIN 25 MG PO TABS: ORAL_TABLET | ORAL | 3 refills | Status: AC

## 2021-07-02 NOTE — Progress Notes (Signed)
Medication Samples have been provided to the patient. ? ?Drug name: corlanonr       Strength: 58m        Qty: 56  LOT: [id number] Exp.Date: 06/23/2025 ? ?Dosing instructions: one tab twice a day ? ?The patient has been instructed regarding the correct time, dose, and frequency of taking this medication, including desired effects and most common side effects.  ? ?JKerry Dory?3:42 PM ?07/02/2021 ? ?

## 2021-07-02 NOTE — Telephone Encounter (Signed)
Patient called.  Patient aware. Repeat labs 5/18 ?Message to [facility] with para medicine to assist with med changes  ?

## 2021-07-02 NOTE — Progress Notes (Signed)
Paramedicine Encounter ? ? ? Patient ID: Brent [name], male    DOB: 30-Mar-1945, 76 y.o.   MRN: [id number] ? ?Met with Brent [name] in clinic today where he was seen by Allena Katz NP. He reports feeling well this week. He feels better this week, his swelling has improved. He has compression hose on today.  ? ?Labs obtained and noted to have elevated kidney function changed to 21m MWF [facility] ? ?I plan to see Mr. HaElnoria Howardn one week. He agreed with visit. Labs to be repeated on the 18th.  ? ?Pill box filled for one week.  ? ?E. Hines with [facility] clinic [place] [facility] [place] clarified how to get his [facility] cytogeneticisthrough their pharmacy. See her notes for same.  ? ?Refills: ?Eliquis ?Lasix ?Remeron  ? ?I will see Mr. HaElnoria Howardn one week.  ? ?ACTION: ?Home visit completed ? ? ? ? ? ? ?

## 2021-07-02 NOTE — Patient Instructions (Signed)
It was great to see you today! ?No medication changes are needed at this time. ? ? ?Labs today ?We will only contact you if something comes back abnormal or we need to make some changes. ?Otherwise no news is good news! ? ?Your physician recommends that you schedule a follow-up appointment in: 2 months with Dr [name] ? ?Do the following things EVERYDAY: ?Weigh yourself in the morning before breakfast. Write it down and keep it in a log. ?Take your medicines as prescribed ?Eat low salt foods--Limit salt (sodium) to 2000 mg per day.  ?Stay as active as you can everyday ?Limit all fluids for the day to less than 2 liters ? ?At the [facility], you and your health needs are our priority. As part of our continuing mission to provide you with exceptional heart care, we have created designated Provider Care Teams. These Care Teams include your primary Cardiologist (physician) and Advanced Practice Providers (APPs- Physician Assistants and Nurse Practitioners) who all work together to provide you with the care you need, when you need it.  ? ?You may see any of the following providers on your designated Care Team at your next follow up: ?Dr Glori Bickers ?Dr Loralie Champagne ?Darrick Grinder, NP ?Lyda Jester, PA ?Jessica Milford,NP ?Marlyce Huge, PA ?Audry Riles, PharmD ? ? ?Please be sure to bring in all your medications bottles to every appointment.  ? ?

## 2021-07-02 NOTE — Telephone Encounter (Signed)
-----   Message from Rafael Bihari, [place] sent at 07/02/2021  4:26 PM EDT ----- ?Kidney function elevated. Please decrease to 40 mg daily alternating with 20 mg every other day. Will need repeat BMET in 10 days. ?

## 2021-07-02 NOTE — Telephone Encounter (Signed)
Advanced Heart Failure Patient Advocate Encounter ? ?Received message that patient has not yet received Jardiance or Corlanor from the [place] pharmacy in [place]. Called and spoke with someone at the [place]. The representative stated that the patient will likely need a new referral in order for the pharmacy to process the medications. The medication has to be sent to the patient's PCP at the [facility] (Dr. Vonna Drafts) and then he will write the RX and send it to the pharmacy there.  ? ?Representative stated she would put in a note for the PCP to review the medications that we would like sent in. The PCP will either reach out to the patient or the office directly with a reply. ? ?Spent about 40 minutes on this call.  ? ?Charlann Boxer, CPhT ? ? ?

## 2021-07-05 ENCOUNTER — Other Ambulatory Visit: Payer: Self-pay | Admitting: Family

## 2021-07-05 DIAGNOSIS — I480 Paroxysmal atrial fibrillation: Secondary | ICD-10-CM

## 2021-07-05 DIAGNOSIS — I10 Essential (primary) hypertension: Secondary | ICD-10-CM

## 2021-07-05 DIAGNOSIS — E1122 Type 2 diabetes mellitus with diabetic chronic kidney disease: Secondary | ICD-10-CM

## 2021-07-05 DIAGNOSIS — I5022 Chronic systolic (congestive) heart failure: Secondary | ICD-10-CM

## 2021-07-08 ENCOUNTER — Ambulatory Visit (INDEPENDENT_AMBULATORY_CARE_PROVIDER_SITE_OTHER): Payer: Medicare Other

## 2021-07-08 DIAGNOSIS — Z95810 Presence of automatic (implantable) cardiac defibrillator: Secondary | ICD-10-CM

## 2021-07-08 DIAGNOSIS — I5042 Chronic combined systolic (congestive) and diastolic (congestive) heart failure: Secondary | ICD-10-CM

## 2021-07-10 ENCOUNTER — Telehealth (HOSPITAL_COMMUNITY): Payer: Self-pay

## 2021-07-10 ENCOUNTER — Ambulatory Visit: Payer: Medicare Other | Admitting: Family

## 2021-07-10 ENCOUNTER — Other Ambulatory Visit (HOSPITAL_COMMUNITY): Payer: Self-pay
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
## 2021-07-10 NOTE — Progress Notes (Signed)
EPIC Encounter for ICM Monitoring ? ?Patient Name: Brent [name] is a 76 y.o. male ?Date: 07/10/2021 ?Primary Care Physican: [name], [name], NP ?Primary Cardiologist: [name] ?Electrophysiologist: Lovena Le ?04/02/2021 Weight: 170.2 lbs ?06/28/2021 Weight: 173 lbs ?                                                         ?  ?Transmission reviewed.   ?  ?Optivol thoracic impedance suggesting possible dryness starting 5/2. ?  ?Prescribed:  ?Furosemide 20 mg Take 2 tablets (40 mg total) by mouth daily AND 1 tablet (20 mg total) daily. Alternating. ?Potassium 20 mEq Take 1 tablet (20 mEq total) by mouth daily. To give 40 meq (2 tablets) today then 40 meq (2 tablet) tomorrow ?Farxiga 10 mg take 1 tablet by mouth daily before breakfast. ?Spironolactone 25 mg take 0.5 tablet (12.5 mg total) daily ?  ?Labs: ?07/11/2021 BMET Scheduled ?07/02/2021 Creatinine 3.10, BUN 45, potassium 4.1, Sodium 137, GFR 20 ?06/11/2021 Creatinine 2.27, BUN 38, Potassium 3.6, Sodium 144 ?04/12/2021 Creatinine 2.15, BUN 29, Potassium 4.5, Sodium 142 ?04/08/2021 Creatinine 2.24, BUN 43, Potassium 4.2, Sodium 144, GFR 30  ?03/27/2021 Creatinine 2.44, BUN 35, Potassium 3.7, Sodium 138,  ?A complete set of results can be found in Results Review. ?  ?Recommendations:  No changes.  ?  ?Follow-up plan: ICM clinic phone appointment on 08/05/2021.   91 day device clinic remote transmission 07/02/2021.   ?  ?EP/Cardiology Office Visits:  10/08/2021 with Dr [name].   ?  ?Copy of ICM check sent to Dr. Lovena Le.   ? ?3 month ICM trend: 07/10/2021. ? ? ? ?12-14 Month ICM trend:  ? ? ? ?Rosalene Billings, RN ?07/10/2021 ?1:31 PM ? ?
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
## 2021-07-11 ENCOUNTER — Ambulatory Visit (HOSPITAL_COMMUNITY)
Admission: RE | Admit: 2021-07-11 | Discharge: 2021-07-11 | Disposition: A | Discharge status: Home / Self Care | Payer: Medicare Other | Source: Ambulatory Visit | Attending: Cardiology | Admitting: Cardiology

## 2021-07-11 DIAGNOSIS — I5042 Chronic combined systolic (congestive) and diastolic (congestive) heart failure: Secondary | ICD-10-CM | POA: Insufficient documentation

## 2021-07-11 LAB — BASIC METABOLIC PANEL
Anion gap: 5 (ref 5–15)
BUN: 41 mg/dL — ABNORMAL HIGH (ref 8–23)
CO2: 24 mmol/L (ref 22–32)
Calcium: 8.7 mg/dL — ABNORMAL LOW (ref 8.9–10.3)
Chloride: 109 mmol/L (ref 98–111)
Creatinine, Ser: 2.24 mg/dL — ABNORMAL HIGH (ref 0.61–1.24)
GFR, Estimated: 30 mL/min — ABNORMAL LOW (ref >60)
Glucose, Bld: 111 mg/dL — ABNORMAL HIGH (ref 70–99)
Potassium: 3.8 mmol/L (ref 3.5–5.1)
Sodium: 138 mmol/L (ref 135–145)

## 2021-07-15 NOTE — Progress Notes (Signed)
Remote ICD transmission.   

## 2021-07-17 ENCOUNTER — Telehealth (HOSPITAL_COMMUNITY): Payer: Self-pay

## 2021-07-17 ENCOUNTER — Other Ambulatory Visit (HOSPITAL_COMMUNITY): Payer: Self-pay

## 2021-07-17 NOTE — Telephone Encounter (Signed)
Spoke to [employer] for [name] and they report they need a refill sent in to their pharmacy for his Flomax. I will forward this call to his PCP for same.

## 2021-07-17 NOTE — Progress Notes (Signed)
Paramedicine Encounter    Patient ID: Brent [name] [name], male    DOB: [birth year], 76 y.o.   MRN: [id number]    Arrived for home visit for Brent [name] [name] who was seated in his living room alert and oriented. He reports he is feeling good with no complaints today. He states he had a dizziness episode and checked his BP with his home monitor and it was 84/50. He reports his infusion nurse [name] came out and re-checked it and Brent [name] [name] reports that she fixed his medications. I am unsure what he meant or what took place but I advised him that I assist with his medications and when he feels this way he needs to report this to me, the heart failure clinic or call EMS. He agreed with this plan. I obtained vitals and assessment. Vitals normal today, BP slightly elevated but he is due for his evening medications. Swelling improved since our med changes with his Lasix. He reports feeling much better now.   I reviewed medications and filled pill box for one week. Refills as noted.   Refills: Budenoside Tamulosin Potassium Seroquel   We reviewed upcoming appointments and confirmed same.    SOCIAL UPDATE: Brent [name] [name] reports the infusion nurse [name] returned his car last Wednesday night when she came out for his infusion around 1900 hours after I reached out to her and asked when his next scheduled infusion was and I inquired about the location of his vehicle. He continues to refuse for me to contact her Conservation officer, historic buildings.  Brent [name]. Brent [name] [name] continues to report that he and Brent [name] [name] went to a Chief Executive Officer on [place] [facility]) I looked up the [employer] and called to leave a voicemail to have an office representative to call me to see if we can confirm his POA.   I also gave Brent [name]. Brent [name] [name] the number for [place] [facility] and instructed him to contact them in the morning to seek assistance in finding resources for legal assistance.     I plan to follow up with Brent [name]. Brent [name] [name] in one week and in the meantime if needed. Home visit  complete.     Patient Care Team: [name], [name], NP as PCP - General (Family Medicine) Martinique, Peter M, MD as PCP - Cardiology (Cardiology) Larey Dresser, MD as PCP - Advanced Heart Failure (Cardiology) Elmarie Shiley, MD (Nephrology) Milus Banister, MD (Gastroenterology) Jorge Ny, LCSW as Social Worker (Licensed Clinical Social Worker)  Patient Active Problem List   Diagnosis Date Noted   Acute kidney injury superimposed on chronic kidney disease ([place]) 02/25/2021   Generalized weakness 02/25/2021   SIRS (systemic inflammatory response syndrome) ([place]) 10/31/2020   Neurocognitive deficits 10/17/2020   Hypokalemia 10/08/2020   Hypomagnesemia 10/08/2020   CKD (chronic kidney disease), stage III ([place]) 10/08/2020   COVID-19 virus infection 10/08/2020   Electrolyte abnormality 05/17/2020   ICD (implantable cardioverter-defibrillator) in place 96/22/2979   Chronic systolic heart failure ([place]) 01/02/2020   Decreased appetite 10/08/2019   Weakness 10/08/2019   Hyperkalemia 89/21/1941   Chronic systolic CHF (congestive heart failure) ([place]) 08/26/2019   Type 2 diabetes mellitus with stage 3 chronic kidney disease ([place]) 08/26/2019   Chronic obstructive pulmonary disease ([place]) 06/20/2019   Ventricular tachycardia ([place]) 06/20/2019   Acute on chronic systolic (congestive) heart failure ([place]) 74/09/1446   Metabolic acidosis 18/56/3149   Orthostasis 09/07/2018   AKI (acute kidney injury) ([place])    Immunosuppressed status ([place])    Macrocytic anemia  Chronic combined systolic and diastolic congestive heart failure ([place]) 06/03/2018   Candida esophagitis ([place]) 09/22/2017   History of smoking 30 or more pack years 01/05/2017   Bipolar 1 disorder (HCC)    BPH (benign prostatic hyperplasia) 03/31/2013   Anemia in chronic kidney disease 03/31/2013   Essential hypertension, benign 03/31/2013   Acute renal failure superimposed on stage 3 chronic kidney disease ([place]) 06/04/2012   Crohn's  regional enteritis ([place]) 01/23/2010    Current Outpatient Medications:    acetaminophen (TYLENOL) 325 MG tablet, Take 650 mg by mouth every 6 (six) hours as needed., Disp: , Rfl:    allopurinol (ZYLOPRIM) 100 MG tablet, TAKE 2 TABLETS(200 MG) BY MOUTH DAILY, Disp: 60 tablet, Rfl: 3   apixaban (ELIQUIS) 5 MG TABS tablet, Take 1 tablet (5 mg total) by mouth 2 (two) times daily., Disp: 60 tablet, Rfl: 0   budesonide (ENTOCORT EC) 3 MG 24 hr capsule, Take 3 capsules (9 mg total) by mouth daily., Disp: 90 capsule, Rfl: 0   Cholecalciferol (VITAMIN D) 125 MCG (5000 UT) CAPS, Take 5,000 Units by mouth daily., Disp: 30 capsule, Rfl: 0   divalproex (DEPAKOTE ER) 500 MG 24 hr tablet, TAKE 3 TABLETS(1500 MG) BY MOUTH AT BEDTIME, Disp: 90 tablet, Rfl: 0   empagliflozin (JARDIANCE) 25 MG TABS tablet, Take 1/2 tablet daily (use 25 mg tablet, per [facility] formulary)., Disp: 30 tablet, Rfl: 3   epoetin alfa (EPOGEN) 10000 UNIT/ML injection, 10,000 Units every 30 (thirty) days., Disp: , Rfl:    Ferrous Sulfate Dried (FEOSOL) 200 (65 Fe) MG TABS, Take 1 tablet by mouth daily., Disp: , Rfl:    furosemide (LASIX) 20 MG tablet, Take 2 tablets (40 mg total) by mouth daily AND 1 tablet (20 mg total) daily. Alternating., Disp: 90 tablet, Rfl: 5   inFLIXimab (REMICADE) 100 MG injection, Infuse Remicade IV schedule 1 68m/kg every 8 weeks Premedicate with Tylenol 500-6555mby mouth and Benadryl 25-5067my mouth prior to infusion. Last PPD was on 12/2009. , Disp: 1 each, Rfl: 6   isosorbide mononitrate (IMDUR) 60 MG 24 hr tablet, Take 1 tablet (60 mg total) by mouth daily., Disp: 90 tablet, Rfl: 3   ivabradine (CORLANOR) 5 MG TABS tablet, Take 1 tablet (5 mg total) by mouth 2 (two) times daily with a meal. TAKE 1 TABLET(5 MG) BY MOUTH TWICE DAILY WITH A MEAL, Disp: 180 tablet, Rfl: 3   loperamide (IMODIUM) 2 MG capsule, Take 1 capsule (2 mg total) by mouth 3 (three) times daily as needed for diarrhea or loose stools., Disp: 30  capsule, Rfl: 0   magnesium oxide (MAG-OX) 400 (240 Mg) MG tablet, TAKE 1 TABLET BY MOUTH IN THE MORNING, AT NOON, AND AT BEDTIME, Disp: 90 tablet, Rfl: 5   metoprolol succinate (TOPROL-XL) 25 MG 24 hr tablet, TAKE 1 AND 1/2 TABLETS(37.5 MG) BY MOUTH IN THE MORNING AND AT BEDTIME, Disp: 270 tablet, Rfl: 0   mirtazapine (REMERON) 7.5 MG tablet, TAKE 1 TABLET(7.5 MG) BY MOUTH AT BEDTIME, Disp: 30 tablet, Rfl: 3   Multiple Vitamin (MULTIVITAMIN WITH MINERALS) TABS tablet, Take 1 tablet by mouth daily., Disp: , Rfl:    omeprazole (PRILOSEC OTC) 20 MG tablet, Take 1 tablet (20 mg total) by mouth daily. For GERD, Disp: 30 tablet, Rfl: 0   potassium chloride SA (KLOR-CON M) 20 MEQ tablet, Take 1 tablet (20 mEq total) by mouth daily. To give 40 meq (2 tablets) today then 40 meq (2 tablet) tomorrow, Disp: 30  tablet, Rfl: 3   prazosin (MINIPRESS) 2 MG capsule, Take 2 mg by mouth at bedtime., Disp: , Rfl:    QUEtiapine (SEROQUEL) 100 MG tablet, Take 1.5 tablets (150 mg total) by mouth at bedtime., Disp: 45 tablet, Rfl: 5   solifenacin (VESICARE) 5 MG tablet, TAKE 1 TABLET(5 MG) BY MOUTH DAILY, Disp: 30 tablet, Rfl: 0   spironolactone (ALDACTONE) 25 MG tablet, Take 0.5 tablets (12.5 mg total) by mouth daily., Disp: 15 tablet, Rfl: 6   tamsulosin (FLOMAX) 0.4 MG CAPS capsule, Take 1 capsule (0.4 mg total) by mouth daily., Disp: 30 capsule, Rfl: 0   vitamin B-12 (CYANOCOBALAMIN) 1000 MCG tablet, Take 1,000 mcg by mouth daily., Disp: , Rfl:  Allergies  Allergen Reactions   Azathioprine Other (See Comments)    REACTION: affected WBC "Almost died"   Ciprofloxacin Other (See Comments)    Reaction not recalled   Levaquin [Levofloxacin In D5w] Other (See Comments)    Reaction not rec   Plendil [Felodipine] Other (See Comments)    Reaction not not recalled     Social History   Socioeconomic History   Marital status: Divorced    Spouse name: Not on file   Number of children: 1   Years of education: 12    Highest education level: Not on file  Occupational History   Occupation: retired   Occupation: Veteran  Tobacco Use   Smoking status: Former    Packs/day: 1.00    Years: 49.00    Pack years: 49.00    Types: Cigarettes    Start date: 04/11/1956    Quit date: 04/17/2014    Years since quitting: 7.2   Smokeless tobacco: Never  Vaping Use   Vaping Use: Never used  Substance and Sexual Activity   Alcohol use: No    Alcohol/week: 0.0 standard drinks   Drug use: No   Sexual activity: Never  Other Topics Concern   Not on file  Social History Narrative   Not on file   Social Determinants of Health   Financial Resource Strain: Not on file  Food Insecurity: No Food Insecurity   Worried About Charity fundraiser in the Last Year: Never true   [place] in the Last Year: Never true  Transportation Needs: No Transportation Needs   Lack of Transportation (Medical): No   Lack of Transportation (Non-Medical): No  Physical Activity: Not on file  Stress: Not on file  Social Connections: Not on file  Intimate Partner Violence: Not on file    Physical Exam      Future Appointments  Date Time Provider Central  07/19/2021 10:00 AM [facility] None  08/05/2021  7:30 AM [facility] DEVICE REMOTES [facility]  10/01/2021  7:00 AM [facility] DEVICE REMOTES [facility]  10/07/2021  9:00 AM PSC-[facility] LAB PSC-[facility] None  10/08/2021 11:40 AM Larey Dresser, MD [facility] None  10/10/2021 10:00 AM [name], [name], NP PSC[facility] None  12/31/2021  7:00 AM [facility] DEVICE REMOTES [facility]  01/31/2022  3:20 PM [name], Dinah C, NP PSC[facility] None  04/01/2022  7:00 AM [facility] DEVICE REMOTES [facility]  07/01/2022  7:00 AM [facility] DEVICE REMOTES [facility]  09/30/2022  7:00 AM [facility] DEVICE REMOTES [facility]     ACTION: Home visit completed

## 2021-07-18 ENCOUNTER — Other Ambulatory Visit (HOSPITAL_COMMUNITY): Payer: Self-pay | Admitting: *Deleted

## 2021-07-18 ENCOUNTER — Other Ambulatory Visit: Payer: Self-pay | Admitting: Family

## 2021-07-18 DIAGNOSIS — N401 Enlarged prostate with lower urinary tract symptoms: Secondary | ICD-10-CM

## 2021-07-18 DIAGNOSIS — E876 Hypokalemia: Secondary | ICD-10-CM

## 2021-07-18 MED ORDER — POTASSIUM CHLORIDE CRYS ER 20 MEQ PO TBCR: 40.0000 meq | EXTENDED_RELEASE_TABLET | Freq: Every day | ORAL | 3 refills | Status: AC

## 2021-07-18 MED ORDER — TAMSULOSIN HCL 0.4 MG PO CAPS: 0.4000 mg | ORAL_CAPSULE | Freq: Every day | ORAL | 1 refills | Status: AC

## 2021-07-18 NOTE — Telephone Encounter (Signed)
Flomax refilled as requested.

## 2021-07-19 ENCOUNTER — Non-Acute Institutional Stay (HOSPITAL_COMMUNITY)
Admission: RE | Admit: 2021-07-19 | Discharge: 2021-07-19 | Disposition: A | Discharge status: Home / Self Care | Payer: No Typology Code available for payment source | Source: Ambulatory Visit | Attending: Internal Medicine | Admitting: Internal Medicine

## 2021-07-19 VITALS — BP 139/89 | HR 79 | Temp 97.5°F | Resp 16

## 2021-07-19 DIAGNOSIS — D631 Anemia in chronic kidney disease: Secondary | ICD-10-CM | POA: Insufficient documentation

## 2021-07-19 DIAGNOSIS — N184 Chronic kidney disease, stage 4 (severe): Secondary | ICD-10-CM | POA: Diagnosis not present

## 2021-07-19 LAB — IRON AND TIBC
Iron: 31 ug/dL — ABNORMAL LOW (ref 45–182)
Saturation Ratios: 14 % — ABNORMAL LOW (ref 17.9–39.5)
TIBC: 228 ug/dL — ABNORMAL LOW (ref 250–450)
UIBC: 197 ug/dL

## 2021-07-19 LAB — HEMOGLOBIN AND HEMATOCRIT, BLOOD
HCT: 37.2 % — ABNORMAL LOW (ref 39.0–52.0)
Hemoglobin: 11.4 g/dL — ABNORMAL LOW (ref 13.0–17.0)

## 2021-07-19 LAB — FERRITIN: Ferritin: 822 ng/mL — ABNORMAL HIGH (ref 24–336)

## 2021-07-19 MED ORDER — EPOETIN ALFA 10000 UNIT/ML IJ SOLN: 10000.0000 [IU] | INTRAMUSCULAR | Status: DC

## 2021-07-19 MED ORDER — EPOETIN ALFA 10000 UNIT/ML IJ SOLN
10000.0000 [IU] | INTRAMUSCULAR | Status: DC
  Administered 2021-07-19: 10000 [IU] via SUBCUTANEOUS
  Filled 2021-07-19: qty 1

## 2021-07-19 NOTE — Discharge Instructions (Signed)
Today's Vitals   07/19/21 1155  BP: 139/89  Pulse: 79  Resp: 16  Temp: (!) 97.5 F (36.4 C)  TempSrc: Temporal  SpO2: 99%   There is no height or weight on file to calculate BMI.

## 2021-07-19 NOTE — Progress Notes (Addendum)
PATIENT CARE CENTER NOTE     Diagnosis: Anemia associated with Chronic Renal Failure, anemia associated with renal disease     Provider: Elmarie Shiley MD     Procedure: Epoetin Alfa (Procrit) injection     Note: Patient received 10,000 unit Procrit sub-q injection in right arm. Tolerated well. Labs drawn pre-injection and Hemoglobin was 11.4. Patient's BP within order parameters at 139/89. Discharge instructions given. Patient to come back every 4 weeks for injections. Patient alert, oriented and ambulatory with cane at discharge.

## 2021-07-23 ENCOUNTER — Telehealth (HOSPITAL_COMMUNITY): Payer: Self-pay

## 2021-07-23 ENCOUNTER — Other Ambulatory Visit: Payer: Self-pay | Admitting: Family

## 2021-07-23 ENCOUNTER — Telehealth: Payer: Self-pay | Admitting: *Deleted

## 2021-07-23 DIAGNOSIS — N401 Enlarged prostate with lower urinary tract symptoms: Secondary | ICD-10-CM

## 2021-07-23 MED FILL — Epoetin Alfa Inj 10000 Unit/ML: INTRAMUSCULAR | Qty: 1 | Status: AC

## 2021-07-23 NOTE — Telephone Encounter (Signed)
Take furosemide 20 mg tablet take 2 tablets by mouth daily for the next 3 days then resume regular dose.  Continue on potassium chloride 40 mEq tablet daily

## 2021-07-23 NOTE — Telephone Encounter (Signed)
Kaitlyn with [facility] called and stated that she just wanted to let you know that patient had a Weight Change: 07/23/2021- 173lb 07/22/2021- 168.8lb  Also she wanted to let you know that patient had a fall Saturday due to legs gave out. No injury except for scape knee.   No other symptoms noted.   FYI

## 2021-07-23 NOTE — Telephone Encounter (Signed)
Patient notified and agreed.  

## 2021-07-23 NOTE — Telephone Encounter (Signed)
Called [facility] on Mr. [name] behalf requesting status of his refills. They advised his Potassium cannot be filled until 6/1 and the Tamsulosin needs a new RX so they will be faxing the doctor. He was able to pick up his Seroquel today. I advised [name] of this and I plan to see him in the home tomorrow. He agreed with plan.   Also see PCP note about med change, I will see him int he morning and adjust medications accordingly.    Salena Saner, [place] 07/23/2021

## 2021-07-24 ENCOUNTER — Other Ambulatory Visit (HOSPITAL_COMMUNITY): Payer: Self-pay
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
## 2021-07-29 ENCOUNTER — Other Ambulatory Visit (HOSPITAL_COMMUNITY): Payer: Self-pay

## 2021-07-29 ENCOUNTER — Telehealth (HOSPITAL_COMMUNITY): Payer: Self-pay | Admitting: *Deleted

## 2021-07-29 DIAGNOSIS — R0689 Other abnormalities of breathing: Secondary | ICD-10-CM | POA: Diagnosis not present

## 2021-07-29 DIAGNOSIS — I499 Cardiac arrhythmia, unspecified: Secondary | ICD-10-CM | POA: Diagnosis not present

## 2021-07-29 DIAGNOSIS — Z743 Need for continuous supervision: Secondary | ICD-10-CM | POA: Diagnosis not present

## 2021-07-29 DIAGNOSIS — R404 Transient alteration of awareness: Secondary | ICD-10-CM | POA: Diagnosis not present

## 2021-07-29 DIAGNOSIS — R4020 Unspecified coma: Secondary | ICD-10-CM | POA: Diagnosis not present

## 2021-07-29 NOTE — Progress Notes (Signed)
Paramedicine Encounter    Patient ID: Brent [name], male    DOB: 1945/12/14, 76 y.o.   MRN: [id number]   Address of Brent [name] was dispatched as a cardiac arrest at [place] and I responded alongside EMS, FD and [facility].  Arrived on scene to find EMS and [facility] CPR on Brent [name].   Brent [name] was last seen by his neighbor last night who stated that Brent [name] had complained of not feeling well and that he needed help getting to the bed. Neighbor reports he assisted Brent [name] to bed and this morning came to find him unresponsive, pulseless and apneic in bed and began CPR.   FD and EMS arrived first and began ALS Care. According to paramedics on scene Brent [name] was found to be in asystole on the monitor. Airway was placed and he was being ventilated with capnography in place. Mechanical CPR was being preformed. IV access with  multiple EPI given along with IV fluid bolus. Protocols carried out appropriately. He remained in Asystole throughout arrest with no regain of pulses. 45 mins of ALS care was carried out and time of death was called at 0934.   I contacted HF clinic to report death and obtain approval of death certificate to be signed by Dr. [name]- cardiologist  Kevan Rosebush RN approved.   Medical equipment removed and patient covered with sheet in the floor.   I contacted [name] sonErlene [name]. He arrived within 15 mins and I assisted him in reaching out to funeral home of family choice- [facility]. They arrived on scene around 1200. [employer] remained on scene to secure home and [name] son given my number incase he has any questions or concerns.   Call complete.   I will reach out to [place] to make doctors aware.   Patient removed from paramedicine list.           ACTION: Home visit completed

## 2021-07-29 NOTE — Telephone Encounter (Signed)
Brent [name], EMT states pt's neighbor found him down/unresponsive this AM, EMT responded and worked on pt for 30 min with no response, pt expired. Dr [name] aware.

## 2021-08-06 ENCOUNTER — Other Ambulatory Visit (HOSPITAL_COMMUNITY): Payer: Self-pay | Admitting: Cardiology

## 2021-08-11 ENCOUNTER — Other Ambulatory Visit: Payer: Self-pay | Admitting: Family

## 2021-08-11 DIAGNOSIS — I480 Paroxysmal atrial fibrillation: Secondary | ICD-10-CM

## 2021-08-11 DIAGNOSIS — R630 Anorexia: Secondary | ICD-10-CM

## 2021-08-16 ENCOUNTER — Encounter (HOSPITAL_COMMUNITY): Payer: Medicare Other

## 2021-10-07 ENCOUNTER — Other Ambulatory Visit: Payer: Medicare Other

## 2021-10-08 ENCOUNTER — Encounter (HOSPITAL_COMMUNITY): Payer: Medicare Other | Admitting: Cardiology

## 2021-10-10 ENCOUNTER — Ambulatory Visit: Payer: Medicare Other | Admitting: Family

## 2022-01-31 ENCOUNTER — Encounter: Payer: Medicare Other | Admitting: Family
